# Patient Record
Sex: Male | Born: 1935 | Race: White | Hispanic: No | Marital: Married | Smoking: Former smoker
Health system: Southern US, Community
[De-identification: ages and names within clinical notes are randomized; demographics above are authoritative.]

## PROBLEM LIST (undated history)

## (undated) DIAGNOSIS — I5032 Chronic diastolic (congestive) heart failure: Secondary | ICD-10-CM

## (undated) DIAGNOSIS — R001 Bradycardia, unspecified: Secondary | ICD-10-CM

## (undated) DIAGNOSIS — Z952 Presence of prosthetic heart valve: Secondary | ICD-10-CM

## (undated) DIAGNOSIS — K219 Gastro-esophageal reflux disease without esophagitis: Secondary | ICD-10-CM

## (undated) DIAGNOSIS — F419 Anxiety disorder, unspecified: Secondary | ICD-10-CM

## (undated) DIAGNOSIS — I350 Nonrheumatic aortic (valve) stenosis: Secondary | ICD-10-CM

## (undated) DIAGNOSIS — G2581 Restless legs syndrome: Secondary | ICD-10-CM

## (undated) DIAGNOSIS — I10 Essential (primary) hypertension: Secondary | ICD-10-CM

## (undated) DIAGNOSIS — F329 Major depressive disorder, single episode, unspecified: Secondary | ICD-10-CM

## (undated) DIAGNOSIS — R0602 Shortness of breath: Secondary | ICD-10-CM

## (undated) DIAGNOSIS — A0472 Enterocolitis due to Clostridium difficile, not specified as recurrent: Secondary | ICD-10-CM

## (undated) DIAGNOSIS — I071 Rheumatic tricuspid insufficiency: Secondary | ICD-10-CM

## (undated) DIAGNOSIS — H9190 Unspecified hearing loss, unspecified ear: Secondary | ICD-10-CM

## (undated) DIAGNOSIS — I2510 Atherosclerotic heart disease of native coronary artery without angina pectoris: Secondary | ICD-10-CM

## (undated) DIAGNOSIS — F32A Depression, unspecified: Secondary | ICD-10-CM

## (undated) DIAGNOSIS — R0989 Other specified symptoms and signs involving the circulatory and respiratory systems: Secondary | ICD-10-CM

## (undated) DIAGNOSIS — K259 Gastric ulcer, unspecified as acute or chronic, without hemorrhage or perforation: Secondary | ICD-10-CM

## (undated) HISTORY — PX: CARDIAC CATHETERIZATION: SHX172

## (undated) HISTORY — DX: Bradycardia, unspecified: R00.1

## (undated) HISTORY — DX: Rheumatic tricuspid insufficiency: I07.1

## (undated) HISTORY — DX: Restless legs syndrome: G25.81

## (undated) HISTORY — DX: Other specified symptoms and signs involving the circulatory and respiratory systems: R09.89

## (undated) HISTORY — DX: Major depressive disorder, single episode, unspecified: F32.9

## (undated) HISTORY — PX: TOTAL HIP ARTHROPLASTY: SHX124

## (undated) HISTORY — PX: HERNIA REPAIR: SHX51

## (undated) HISTORY — DX: Presence of prosthetic heart valve: Z95.2

## (undated) HISTORY — PX: JOINT REPLACEMENT: SHX530

## (undated) HISTORY — DX: Depression, unspecified: F32.A

## (undated) HISTORY — DX: Chronic diastolic (congestive) heart failure: I50.32

## (undated) HISTORY — DX: Shortness of breath: R06.02

## (undated) HISTORY — DX: Nonrheumatic aortic (valve) stenosis: I35.0

## (undated) HISTORY — DX: Essential (primary) hypertension: I10

## (undated) HISTORY — DX: Atherosclerotic heart disease of native coronary artery without angina pectoris: I25.10

## (undated) HISTORY — DX: Gastro-esophageal reflux disease without esophagitis: K21.9

## (undated) HISTORY — DX: Unspecified hearing loss, unspecified ear: H91.90

## (undated) HISTORY — DX: Anxiety disorder, unspecified: F41.9

---

## 1999-09-04 ENCOUNTER — Encounter (INDEPENDENT_AMBULATORY_CARE_PROVIDER_SITE_OTHER): Payer: Self-pay | Admitting: Specialist

## 1999-09-04 ENCOUNTER — Other Ambulatory Visit: Admission: RE | Admit: 1999-09-04 | Discharge: 1999-09-04 | Payer: Self-pay | Admitting: Internal Medicine

## 2000-02-14 ENCOUNTER — Encounter: Payer: Self-pay | Admitting: Specialist

## 2000-02-14 ENCOUNTER — Encounter: Admission: RE | Admit: 2000-02-14 | Discharge: 2000-02-14 | Payer: Self-pay | Admitting: Specialist

## 2000-03-05 ENCOUNTER — Encounter: Admission: RE | Admit: 2000-03-05 | Discharge: 2000-06-03 | Payer: Self-pay | Admitting: Anesthesiology

## 2000-11-25 HISTORY — PX: VALVE REPLACEMENT: SUR13

## 2000-12-01 ENCOUNTER — Encounter (INDEPENDENT_AMBULATORY_CARE_PROVIDER_SITE_OTHER): Payer: Self-pay | Admitting: Specialist

## 2000-12-01 ENCOUNTER — Encounter: Payer: Self-pay | Admitting: Cardiothoracic Surgery

## 2000-12-02 ENCOUNTER — Encounter: Payer: Self-pay | Admitting: Cardiothoracic Surgery

## 2000-12-02 ENCOUNTER — Inpatient Hospital Stay (HOSPITAL_COMMUNITY): Admission: RE | Admit: 2000-12-02 | Discharge: 2000-12-08 | Payer: Self-pay | Admitting: *Deleted

## 2000-12-03 ENCOUNTER — Encounter: Payer: Self-pay | Admitting: Cardiothoracic Surgery

## 2000-12-04 ENCOUNTER — Encounter: Payer: Self-pay | Admitting: Cardiothoracic Surgery

## 2000-12-05 ENCOUNTER — Encounter: Payer: Self-pay | Admitting: Cardiothoracic Surgery

## 2000-12-06 ENCOUNTER — Encounter: Payer: Self-pay | Admitting: Cardiothoracic Surgery

## 2000-12-18 ENCOUNTER — Encounter: Payer: Self-pay | Admitting: Cardiothoracic Surgery

## 2000-12-18 ENCOUNTER — Encounter: Admission: RE | Admit: 2000-12-18 | Discharge: 2000-12-18 | Payer: Self-pay | Admitting: Cardiothoracic Surgery

## 2001-01-20 ENCOUNTER — Encounter (HOSPITAL_COMMUNITY): Admission: RE | Admit: 2001-01-20 | Discharge: 2001-04-20 | Payer: Self-pay | Admitting: Cardiology

## 2001-04-21 ENCOUNTER — Encounter (HOSPITAL_COMMUNITY): Admission: RE | Admit: 2001-04-21 | Discharge: 2001-06-03 | Payer: Self-pay | Admitting: Cardiology

## 2003-07-14 ENCOUNTER — Emergency Department (HOSPITAL_COMMUNITY): Admission: EM | Admit: 2003-07-14 | Discharge: 2003-07-14 | Payer: Self-pay | Admitting: Emergency Medicine

## 2003-07-14 ENCOUNTER — Encounter: Payer: Self-pay | Admitting: Emergency Medicine

## 2004-09-27 ENCOUNTER — Ambulatory Visit: Payer: Self-pay | Admitting: Internal Medicine

## 2004-10-11 ENCOUNTER — Ambulatory Visit: Payer: Self-pay | Admitting: Internal Medicine

## 2004-11-08 ENCOUNTER — Ambulatory Visit: Payer: Self-pay | Admitting: Cardiology

## 2004-11-22 ENCOUNTER — Ambulatory Visit: Payer: Self-pay | Admitting: *Deleted

## 2004-12-18 ENCOUNTER — Ambulatory Visit: Payer: Self-pay | Admitting: Cardiology

## 2005-01-15 ENCOUNTER — Ambulatory Visit: Payer: Self-pay | Admitting: *Deleted

## 2005-02-12 ENCOUNTER — Ambulatory Visit: Payer: Self-pay | Admitting: Internal Medicine

## 2005-02-14 ENCOUNTER — Ambulatory Visit: Payer: Self-pay

## 2005-03-12 ENCOUNTER — Ambulatory Visit: Payer: Self-pay | Admitting: Cardiology

## 2005-04-16 ENCOUNTER — Ambulatory Visit: Payer: Self-pay | Admitting: Internal Medicine

## 2005-05-14 ENCOUNTER — Ambulatory Visit: Payer: Self-pay | Admitting: Cardiology

## 2005-05-31 ENCOUNTER — Ambulatory Visit: Payer: Self-pay | Admitting: Cardiology

## 2005-06-27 ENCOUNTER — Ambulatory Visit: Payer: Self-pay | Admitting: Internal Medicine

## 2005-07-11 ENCOUNTER — Ambulatory Visit: Payer: Self-pay | Admitting: Internal Medicine

## 2005-08-08 ENCOUNTER — Ambulatory Visit: Payer: Self-pay | Admitting: Internal Medicine

## 2005-09-02 ENCOUNTER — Ambulatory Visit: Payer: Self-pay | Admitting: Cardiology

## 2005-09-23 ENCOUNTER — Ambulatory Visit: Payer: Self-pay | Admitting: Internal Medicine

## 2005-10-21 ENCOUNTER — Ambulatory Visit: Payer: Self-pay | Admitting: Cardiology

## 2005-11-12 ENCOUNTER — Ambulatory Visit: Payer: Self-pay | Admitting: Cardiovascular Disease

## 2005-12-10 ENCOUNTER — Ambulatory Visit: Payer: Self-pay | Admitting: *Deleted

## 2006-01-07 ENCOUNTER — Ambulatory Visit: Payer: Self-pay | Admitting: *Deleted

## 2006-02-03 ENCOUNTER — Ambulatory Visit: Payer: Self-pay | Admitting: Cardiology

## 2006-03-17 ENCOUNTER — Ambulatory Visit: Payer: Self-pay | Admitting: Internal Medicine

## 2006-04-16 ENCOUNTER — Ambulatory Visit: Payer: Self-pay | Admitting: Cardiology

## 2006-04-30 ENCOUNTER — Ambulatory Visit: Payer: Self-pay | Admitting: Cardiology

## 2006-05-27 ENCOUNTER — Ambulatory Visit: Payer: Self-pay | Admitting: Internal Medicine

## 2006-06-24 ENCOUNTER — Ambulatory Visit: Payer: Self-pay | Admitting: Cardiology

## 2006-07-11 ENCOUNTER — Ambulatory Visit: Payer: Self-pay | Admitting: Internal Medicine

## 2006-07-23 ENCOUNTER — Ambulatory Visit: Payer: Self-pay | Admitting: Cardiology

## 2006-08-07 ENCOUNTER — Ambulatory Visit: Payer: Self-pay | Admitting: Cardiology

## 2006-08-19 ENCOUNTER — Ambulatory Visit: Payer: Self-pay | Admitting: Cardiovascular Disease

## 2006-09-02 ENCOUNTER — Ambulatory Visit: Payer: Self-pay | Admitting: Internal Medicine

## 2006-09-30 ENCOUNTER — Ambulatory Visit: Payer: Self-pay | Admitting: *Deleted

## 2006-10-15 ENCOUNTER — Ambulatory Visit: Payer: Self-pay | Admitting: Cardiovascular Disease

## 2006-11-11 ENCOUNTER — Ambulatory Visit: Payer: Self-pay | Admitting: Cardiology

## 2006-12-09 ENCOUNTER — Ambulatory Visit: Payer: Self-pay | Admitting: Cardiology

## 2007-01-07 ENCOUNTER — Ambulatory Visit: Payer: Self-pay | Admitting: *Deleted

## 2007-01-27 ENCOUNTER — Ambulatory Visit: Payer: Self-pay | Admitting: Cardiology

## 2007-02-04 ENCOUNTER — Ambulatory Visit: Payer: Self-pay | Admitting: Cardiology

## 2007-04-01 ENCOUNTER — Ambulatory Visit: Payer: Self-pay | Admitting: *Deleted

## 2007-04-21 ENCOUNTER — Ambulatory Visit: Payer: Self-pay | Admitting: Cardiology

## 2007-05-19 ENCOUNTER — Ambulatory Visit: Payer: Self-pay | Admitting: Cardiology

## 2007-06-16 ENCOUNTER — Ambulatory Visit: Payer: Self-pay | Admitting: Internal Medicine

## 2007-07-14 ENCOUNTER — Ambulatory Visit: Payer: Self-pay | Admitting: Cardiology

## 2007-08-24 ENCOUNTER — Ambulatory Visit: Payer: Self-pay | Admitting: Cardiovascular Disease

## 2007-09-28 ENCOUNTER — Ambulatory Visit: Payer: Self-pay | Admitting: Cardiovascular Disease

## 2007-10-26 ENCOUNTER — Ambulatory Visit: Payer: Self-pay

## 2007-11-24 ENCOUNTER — Ambulatory Visit: Payer: Self-pay | Admitting: Cardiology

## 2007-12-23 ENCOUNTER — Ambulatory Visit: Payer: Self-pay | Admitting: Cardiology

## 2008-01-20 ENCOUNTER — Ambulatory Visit: Payer: Self-pay | Admitting: Cardiology

## 2008-02-11 ENCOUNTER — Ambulatory Visit: Payer: Self-pay | Admitting: Cardiology

## 2008-02-16 ENCOUNTER — Ambulatory Visit: Payer: Self-pay | Admitting: Cardiology

## 2008-02-16 ENCOUNTER — Ambulatory Visit: Payer: Self-pay

## 2008-02-16 ENCOUNTER — Encounter: Payer: Self-pay | Admitting: Cardiology

## 2008-02-24 ENCOUNTER — Encounter: Admission: RE | Admit: 2008-02-24 | Discharge: 2008-02-24 | Payer: Self-pay | Admitting: Specialist

## 2008-03-21 ENCOUNTER — Ambulatory Visit: Payer: Self-pay | Admitting: Cardiology

## 2008-03-28 ENCOUNTER — Ambulatory Visit: Payer: Self-pay

## 2008-04-21 ENCOUNTER — Ambulatory Visit: Payer: Self-pay | Admitting: Cardiology

## 2008-05-23 ENCOUNTER — Ambulatory Visit: Payer: Self-pay | Admitting: Cardiology

## 2008-06-21 ENCOUNTER — Ambulatory Visit: Payer: Self-pay | Admitting: Cardiology

## 2008-07-14 ENCOUNTER — Ambulatory Visit: Payer: Self-pay | Admitting: Internal Medicine

## 2008-07-25 ENCOUNTER — Ambulatory Visit: Payer: Self-pay | Admitting: Internal Medicine

## 2008-09-14 ENCOUNTER — Ambulatory Visit: Payer: Self-pay | Admitting: Cardiology

## 2008-10-12 ENCOUNTER — Ambulatory Visit: Payer: Self-pay | Admitting: Internal Medicine

## 2008-10-25 ENCOUNTER — Ambulatory Visit: Payer: Self-pay | Admitting: Cardiology

## 2008-11-15 ENCOUNTER — Ambulatory Visit: Payer: Self-pay | Admitting: Internal Medicine

## 2008-12-14 ENCOUNTER — Ambulatory Visit: Payer: Self-pay | Admitting: Cardiology

## 2009-01-18 ENCOUNTER — Ambulatory Visit: Payer: Self-pay | Admitting: Cardiology

## 2009-01-20 ENCOUNTER — Ambulatory Visit: Payer: Self-pay | Admitting: Cardiology

## 2009-02-09 ENCOUNTER — Ambulatory Visit: Payer: Self-pay | Admitting: Internal Medicine

## 2009-02-20 ENCOUNTER — Ambulatory Visit: Payer: Self-pay | Admitting: Cardiovascular Disease

## 2009-02-27 ENCOUNTER — Encounter: Payer: Self-pay | Admitting: Cardiology

## 2009-02-27 ENCOUNTER — Ambulatory Visit: Payer: Self-pay | Admitting: Cardiology

## 2009-04-12 ENCOUNTER — Ambulatory Visit: Payer: Self-pay | Admitting: Cardiology

## 2009-04-25 ENCOUNTER — Ambulatory Visit: Payer: Self-pay | Admitting: Cardiology

## 2009-05-23 ENCOUNTER — Ambulatory Visit: Payer: Self-pay | Admitting: Cardiology

## 2009-05-30 ENCOUNTER — Ambulatory Visit: Payer: Self-pay | Admitting: Cardiology

## 2009-06-13 ENCOUNTER — Ambulatory Visit: Payer: Self-pay | Admitting: Cardiology

## 2009-06-27 ENCOUNTER — Telehealth: Payer: Self-pay | Admitting: Cardiology

## 2009-07-04 ENCOUNTER — Ambulatory Visit: Payer: Self-pay | Admitting: Cardiology

## 2009-07-10 ENCOUNTER — Encounter: Payer: Self-pay | Admitting: *Deleted

## 2009-08-02 ENCOUNTER — Ambulatory Visit: Payer: Self-pay | Admitting: Cardiology

## 2009-08-03 LAB — CONVERTED CEMR LAB
INR: 2.4 — ABNORMAL HIGH (ref 0.0–1.5)
Prothrombin Time: 26.2 s — ABNORMAL HIGH (ref 11.6–15.2)

## 2009-08-31 ENCOUNTER — Ambulatory Visit: Payer: Self-pay | Admitting: Cardiovascular Disease

## 2009-08-31 LAB — CONVERTED CEMR LAB: POC INR: 3.4

## 2009-09-25 ENCOUNTER — Encounter: Payer: Self-pay | Admitting: Cardiology

## 2009-09-26 ENCOUNTER — Ambulatory Visit: Payer: Self-pay | Admitting: Cardiology

## 2009-09-26 ENCOUNTER — Ambulatory Visit: Payer: Self-pay | Admitting: Internal Medicine

## 2009-09-26 LAB — CONVERTED CEMR LAB: POC INR: 3

## 2009-10-25 ENCOUNTER — Ambulatory Visit: Payer: Self-pay | Admitting: Internal Medicine

## 2009-11-22 ENCOUNTER — Ambulatory Visit: Payer: Self-pay | Admitting: Cardiology

## 2009-12-20 ENCOUNTER — Ambulatory Visit: Payer: Self-pay | Admitting: Internal Medicine

## 2009-12-20 LAB — CONVERTED CEMR LAB: POC INR: 1.9

## 2010-01-17 ENCOUNTER — Ambulatory Visit: Payer: Self-pay | Admitting: Cardiovascular Disease

## 2010-01-17 LAB — CONVERTED CEMR LAB: POC INR: 2.4

## 2010-02-14 ENCOUNTER — Ambulatory Visit: Payer: Self-pay | Admitting: Cardiovascular Disease

## 2010-02-16 ENCOUNTER — Telehealth: Payer: Self-pay | Admitting: Internal Medicine

## 2010-03-14 ENCOUNTER — Ambulatory Visit: Payer: Self-pay | Admitting: Cardiovascular Disease

## 2010-04-11 ENCOUNTER — Ambulatory Visit: Payer: Self-pay | Admitting: Cardiovascular Disease

## 2010-04-11 LAB — CONVERTED CEMR LAB: POC INR: 2.1

## 2010-04-19 ENCOUNTER — Telehealth: Payer: Self-pay | Admitting: Cardiology

## 2010-04-20 ENCOUNTER — Telehealth: Payer: Self-pay | Admitting: Cardiology

## 2010-05-07 ENCOUNTER — Encounter: Admission: RE | Admit: 2010-05-07 | Discharge: 2010-05-07 | Payer: Self-pay | Admitting: Neurological Surgery

## 2010-05-09 ENCOUNTER — Encounter: Payer: Self-pay | Admitting: Cardiology

## 2010-05-09 ENCOUNTER — Ambulatory Visit: Payer: Self-pay | Admitting: Cardiovascular Disease

## 2010-05-10 ENCOUNTER — Ambulatory Visit: Payer: Self-pay | Admitting: Cardiology

## 2010-06-06 ENCOUNTER — Ambulatory Visit: Payer: Self-pay | Admitting: Cardiovascular Disease

## 2010-06-06 LAB — CONVERTED CEMR LAB: POC INR: 2.6

## 2010-07-04 ENCOUNTER — Ambulatory Visit: Payer: Self-pay | Admitting: Cardiovascular Disease

## 2010-07-09 ENCOUNTER — Telehealth: Payer: Self-pay | Admitting: Cardiology

## 2010-07-10 ENCOUNTER — Telehealth: Payer: Self-pay | Admitting: Cardiology

## 2010-07-31 ENCOUNTER — Ambulatory Visit: Payer: Self-pay | Admitting: Cardiology

## 2010-07-31 ENCOUNTER — Ambulatory Visit: Payer: Self-pay | Admitting: Cardiovascular Disease

## 2010-07-31 LAB — CONVERTED CEMR LAB: POC INR: 2.8

## 2010-08-25 DIAGNOSIS — R0602 Shortness of breath: Secondary | ICD-10-CM

## 2010-08-25 DIAGNOSIS — F419 Anxiety disorder, unspecified: Secondary | ICD-10-CM

## 2010-08-25 HISTORY — DX: Shortness of breath: R06.02

## 2010-08-25 HISTORY — DX: Anxiety disorder, unspecified: F41.9

## 2010-08-27 ENCOUNTER — Ambulatory Visit: Payer: Self-pay | Admitting: Cardiovascular Disease

## 2010-08-27 ENCOUNTER — Telehealth: Payer: Self-pay | Admitting: Cardiology

## 2010-08-28 ENCOUNTER — Encounter: Payer: Self-pay | Admitting: Cardiology

## 2010-08-29 ENCOUNTER — Ambulatory Visit: Payer: Self-pay | Admitting: Cardiology

## 2010-08-30 ENCOUNTER — Encounter: Payer: Self-pay | Admitting: Cardiology

## 2010-08-30 ENCOUNTER — Emergency Department: Payer: Self-pay | Admitting: Emergency Medicine

## 2010-08-30 ENCOUNTER — Ambulatory Visit: Payer: Self-pay | Admitting: Cardiology

## 2010-08-30 ENCOUNTER — Inpatient Hospital Stay (HOSPITAL_COMMUNITY): Admission: AD | Admit: 2010-08-30 | Discharge: 2010-09-01 | Payer: Self-pay | Admitting: Cardiology

## 2010-08-31 ENCOUNTER — Encounter: Payer: Self-pay | Admitting: Cardiology

## 2010-08-31 ENCOUNTER — Ambulatory Visit: Payer: Self-pay | Admitting: Cardiology

## 2010-09-06 ENCOUNTER — Telehealth: Payer: Self-pay | Admitting: Cardiology

## 2010-09-07 ENCOUNTER — Ambulatory Visit: Payer: Self-pay | Admitting: Cardiovascular Disease

## 2010-09-28 ENCOUNTER — Encounter: Payer: Self-pay | Admitting: Cardiology

## 2010-10-01 ENCOUNTER — Ambulatory Visit: Payer: Self-pay | Admitting: Cardiology

## 2010-10-01 ENCOUNTER — Ambulatory Visit: Payer: Self-pay | Admitting: Internal Medicine

## 2010-10-01 LAB — CONVERTED CEMR LAB: POC INR: 2.6

## 2010-10-31 ENCOUNTER — Ambulatory Visit: Payer: Self-pay | Admitting: Cardiovascular Disease

## 2010-11-28 ENCOUNTER — Ambulatory Visit: Admission: RE | Admit: 2010-11-28 | Discharge: 2010-11-28 | Payer: Self-pay | Source: Home / Self Care

## 2010-12-05 ENCOUNTER — Telehealth: Payer: Self-pay | Admitting: Cardiology

## 2010-12-11 ENCOUNTER — Ambulatory Visit: Admit: 2010-12-11 | Payer: Self-pay | Admitting: Cardiology

## 2010-12-27 NOTE — Medication Information (Signed)
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 no    Recent/future dental: no  Any missed doses?: no       Is patient compliant with meds? yes       Allergies: No Known Drug Allergies  Anticoagulation Management History:      The patient is taking warfarin and comes in today for a routine follow up visit.  Positive risk factors for bleeding include an age of 10 years or older.  The bleeding index is 'intermediate risk'.  Positive CHADS2 values include History of HTN and Age > 82 years old.  The start date was 12/01/2000.  His last INR was 2.4.  Anticoagulation responsible provider: Gollan.  INR POC: 2.7.  Cuvette Lot#: [id number].  Exp: 08/2011.    Anticoagulation Management Assessment/Plan:      The patient's current anticoagulation dose is Warfarin sodium 5 mg tabs: Use as directed by Anticoagulation Clinic.  The target INR is 2 - 3.  The next INR is due 08/01/2010.  Anticoagulation instructions were given to patient.  Results were reviewed/authorized by Cloyde Reams, RN, BSN.  He was notified by Cloyde Reams RN.         Prior Anticoagulation Instructions: INR 2.6  Continue on same dosage 1 tablet daily except 1.5 tablets on Tuesdays and Thursdays.  Recheck in 4 weeks.    Current Anticoagulation Instructions: INR 2.7  Continue on same dosage 1 tablet daily except 1/2 tablet on Tuesdays and Thursdays.  Recheck in 4 weeks.

## 2010-12-27 NOTE — Progress Notes (Signed)
Summary: c/o high blood pressure   Phone Note Call from Patient Call back at Home Phone [phone] Call back at [phone]   Caller: Patient Reason for Call: Talk to Nurse Summary of Call: per pt calling c/o elevated blood pressure, no energy, pt went to fire dept -186/80.  Initial call taken by: Lorne Skeens,  Apr 19, 2010 3:05 PM  Follow-up for Phone Call        pt  states he has noticed his BP has been running 180s/90s for past few days, has been feeling more tired than usual no chest pain or discomfort, pt states he is on vacation at the beach. Pt denies new meds or increase of salt in diet, he states he has been taking all his meds which include diovan 360/25 daily, met. 25mg  bid, and amlodipine 5mg  dailywill discuss w/Dr Myrtis Ser and call pt back tom Meredith Staggers, RN  Apr 19, 2010 3:42 PM   Additional Follow-up for Phone Call Additional follow up Details #1::        Increase amlodipine to 10 mg daily. Careful with salt intake. See me in office 04/27/2010 if he plans to be back in town.    Additional Follow-up for Phone Call Additional follow up Details #2::    pt aware will increase amlodipine to 10mg , is not returning from beach until 6/10, sch appt for 6/16 will cont to monitor and let us know if BP staying elevated Meredith Staggers, RN  Apr 20, 2010 10:50 AM

## 2010-12-27 NOTE — Assessment & Plan Note (Signed)
Summary: rov  Medications Added METOPROLOL TARTRATE 25 MG TABS (METOPROLOL TARTRATE) Take 1/2 tablet by mouth twice a day DIOVAN HCT 320-12.5 MG TABS (VALSARTAN-HYDROCHLOROTHIAZIDE) Take 1 tablet by mouth once a day      Allergies Added: NKDA  Visit Type:  and on visit Primary Provider:  Yates Decamp, MD  CC:  shortness of breath.  History of Present Illness: The patient was added to the schedule today.  He called saying he was having some shortness of breath at nighttime.  He is here with his daughter today.  After further questioning I now understand that he has had some problems with depression and anxiety recently.  He has been working with his primary physician.  His meds are being adjusted.  Sometimes he wakes up in the morning feeling nervous.  He has felt some of this during the night with some shortness of breath.  However, he is not having shortness of breath with exertion.  There is no chest pain.  There's been no syncope or presyncope.  His appetite is decreased.  He may have lost a small amount of weight recently.  Current Medications (verified): 1)  Warfarin Sodium 5 Mg Tabs (Warfarin Sodium) .... Use As Directed By Anticoagulation Clinic 2)  Metoprolol Tartrate 25 Mg Tabs (Metoprolol Tartrate) .... Take 1/2 Tablet By Mouth Twice A Day 3)  Aspirin 81 Mg Tbec (Aspirin) .... Take One Tablet By Mouth Daily 4)  Proscar 5 Mg Tabs (Finasteride) .Marland Kitchen.. 1 Tab By Mouth Once Daily 5)  Flomax 0.4 Mg Xr24h-Cap (Tamsulosin Hcl) .Marland Kitchen.. 1 Tab By Mouth Once Daily 6)  Multivitamins   Tabs (Multiple Vitamin) .... Daily 7)  Celebrex 100 Mg Caps (Celecoxib) .... As Needed 8)  Diovan Hct 320-12.5 Mg Tabs (Valsartan-Hydrochlorothiazide) .... Take 1 Tablet By Mouth Once A Day 9)  Amlodipine Besylate 10 Mg Tabs (Amlodipine Besylate) .... Take One Tablet By Mouth Daily 10)  Alprazolam 0.5 Mg Tabs (Alprazolam) .... 1/4 As Needed 11)  Allegra 180 Mg Tabs (Fexofenadine Hcl) .... (Seasonal For  Allergies) 12)  Fluoxetine Hcl 20 Mg Caps (Fluoxetine Hcl) .... Take 1 Tablet By Mouth Once A Day  Allergies (verified): No Known Drug Allergies  Past History:  Past Medical History: St. Jude mechanical prosthesis (aortic valve) in 2002. Marland Kitchen..echo.. march, 2009... valve working well. Coumadin.  Hypertension   BP higher than usual 04/19/2010...amlopdipine increased by telephone. GERD treated.  EF  55-65%... echo... march, 2009 No significant coronary disease....catheterization.... 2002  Question of soft carotid bruits in the past but no abnormalities  Decreased hearing in his right ear Bradycardia..... chronic and continued with no symptoms September, 2011 Depression  /  anxiety...... October, 2011  Review of Systems       The patient denies fever, chills, headache, sweats, rash, change in vision, change in hearing, nausea vomiting, urinary symptoms.  All other systems are reviewed and are negative.  Vital Signs:  Patient profile:   77 year old male Height:      71 inches Weight:      194 pounds BMI:     27.16 Pulse rate:   55 / minute BP sitting:   114 / 60  (left arm) Cuff size:   regular  Vitals Entered By: Hardin Negus, RMA (August 29, 2010 10:17 AM)  Physical Exam  General:  Patient is stable today.  He is here with his daughter. Head:  head is atraumatic. Eyes:  no xanthelasma. Neck:  no jugular venous extension. Chest Wall:  no chest wall tenderness. Lungs:  lungs are clear.  Respiratory effort is nonlabored. Heart:  cardiac exam reveals an S1 and S2.  The closure sound of his aortic prosthesis is crisp.  There is no AI. Abdomen:  abdomen is soft. Msk:  no musculoskeletal deformities. Extremities:  no peripheral edema. Skin:  no skin rashes. Psych:  patient is oriented to person time and place.  Affect is normal.   Impression & Recommendations:  Problem # 1:  BRADYCARDIA (ICD-427.89)  The following medications were removed from the medication list:     Metoprolol Tartrate 25 Mg Tabs (Metoprolol tartrate) .Marland Kitchen... Take 1/2 tablet by mouth twice a day His updated medication list for this problem includes:    Warfarin Sodium 5 Mg Tabs (Warfarin sodium) ..... Use as directed by anticoagulation clinic    Aspirin 81 Mg Tbec (Aspirin) .Marland Kitchen... Take one tablet by mouth daily    Amlodipine Besylate 10 Mg Tabs (Amlodipine besylate) .Marland Kitchen... Take one tablet by mouth daily The patient continues to have mild sinus bradycardia.  I feel that this is probably not playing a role in his current symptoms.  However, there is no proven coronary disease.  Blood pressure is stable.  He is on a very low dose of beta blocker.  We will stop his beta blocker.  Problem # 2:  * COUMADIN THERAPY  stable on Coumadin.  This is to be continued.  Problem # 3:  ESSENTIAL HYPERTENSION, BENIGN (ICD-401.1)  The following medications were removed from the medication list:    Metoprolol Tartrate 25 Mg Tabs (Metoprolol tartrate) .Marland Kitchen... Take 1/2 tablet by mouth twice a day His updated medication list for this problem includes:    Aspirin 81 Mg Tbec (Aspirin) .Marland Kitchen... Take one tablet by mouth daily    Diovan Hct 320-12.5 Mg Tabs (Valsartan-hydrochlorothiazide) .Marland Kitchen... Take 1 tablet by mouth once a day    Amlodipine Besylate 10 Mg Tabs (Amlodipine besylate) .Marland Kitchen... Take one tablet by mouth daily I. pressures currently quite stable.  No change in therapy.  Problem # 4:  AORTIC VALVE REPLACEMENT, HX OF (ICD-V43.3)  The patient's aortic valve prosthesis appears to be working well on physical exam.  However with intermittent shortness of breath at rest we need to be sure that there is no unusual finding on his aortic prosthesis.  He will followup 2-D echo.  I will be in touch with him with the results.  Orders: Echocardiogram (Echo)  Problem # 5:  * ANXIETY DEPRESSION This is a new problem for him as it relates to my evaluation.  His primary physician is working with this carefully.  I suspect that  his decreased appetite as part of this.  I suspect that his sensation of shortness of breath at night is also part of this.  However we need to be careful to be sure he is not having malfunction of his aortic valve prosthesis.  Patient Instructions: 1)  Stop Metoprolol 2)  Your physician has requested that you have an echocardiogram.  Echocardiography is a painless test that uses sound waves to create images of your heart. It provides your doctor with information about the size and shape of your heart and how well your heart's chambers and valves are working.  This procedure takes approximately one hour. There are no restrictions for this procedure.  In [place] office. 3)  Follow up in 3 months.

## 2010-12-27 NOTE — Medication Information (Signed)
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 no    Recent/future dental: no  Any missed doses?: no       Is patient compliant with meds? yes       Allergies: No Known Drug Allergies  Anticoagulation Management History:      The patient is taking warfarin and comes in today for a routine follow up visit.  Positive risk factors for bleeding include an age of 75 years or older.  The bleeding index is 'intermediate risk'.  Positive CHADS2 values include History of HTN and Age > 36 years old.  The start date was 12/01/2000.  His last INR was 2.4.  Anticoagulation responsible provider: Lyle Leisner.  INR POC: 2.6.  Cuvette Lot#: [id number].  Exp: 07/2011.    Anticoagulation Management Assessment/Plan:      The patient's current anticoagulation dose is Warfarin sodium 5 mg tabs: Use as directed by Anticoagulation Clinic.  The target INR is 2 - 3.  The next INR is due 07/04/2010.  Anticoagulation instructions were given to patient.  Results were reviewed/authorized by Cloyde Reams, RN, BSN.  He was notified by Cloyde Reams RN.         Prior Anticoagulation Instructions: INR 2.5  Continue taking 1 tab daily except 1.5 tab on Tuesday and Thursday. Recheck in 4 weeks  Current Anticoagulation Instructions: INR 2.6  Continue on same dosage 1 tablet daily except 1.5 tablets on Tuesdays and Thursdays.  Recheck in 4 weeks.

## 2010-12-27 NOTE — Medication Information (Signed)
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 no    Recent/future dental: no  Any missed doses?: no       Is patient compliant with meds? yes       Allergies: No Known Drug Allergies  Anticoagulation Management History:      The patient is taking warfarin and comes in today for a routine follow up visit.  Positive risk factors for bleeding include an age of 75 years or older.  The bleeding index is 'intermediate risk'.  Positive CHADS2 values include History of HTN and Age > 31 years old.  The start date was 12/01/2000.  His last INR was 2.4.  Anticoagulation responsible provider: Zackarey Holleman.  INR POC: 2.1.  Exp: 08/2010.    Anticoagulation Management Assessment/Plan:      The patient's current anticoagulation dose is Warfarin sodium 5 mg tabs: Use as directed by Anticoagulation Clinic.  The target INR is 2 - 3.  The next INR is due 03/14/2010.  Anticoagulation instructions were given to patient.  Results were reviewed/authorized by Charlena Cross, RN, BSN.  He was notified by Charlena Cross, RN, BSN.         Prior Anticoagulation Instructions: The patient is to continue with the same dose of coumadin.  This dosage includes: coumadin 5 mg daily with 7.5 mg on Tues and Thurs  Current Anticoagulation Instructions: Same as Prior Instructions.

## 2010-12-27 NOTE — Medication Information (Signed)
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 no    Recent/future dental: no  Any missed doses?: no       Is patient compliant with meds? yes       Current Medications (verified): 1)  Warfarin Sodium 5 Mg Tabs (Warfarin Sodium) .... Use As Directed By Anticoagulation Clinic 2)  Metoprolol Tartrate 25 Mg Tabs (Metoprolol Tartrate) .... Take One Tablet By Mouth Twice A Day 3)  Aspirin 81 Mg Tbec (Aspirin) .... Take One Tablet By Mouth Daily 4)  Proscar 5 Mg Tabs (Finasteride) .Marland Kitchen.. 1 Tab By Mouth Once Daily 5)  Flomax 0.4 Mg Xr24h-Cap (Tamsulosin Hcl) .Marland Kitchen.. 1 Tab By Mouth Once Daily 6)  Multivitamins   Tabs (Multiple Vitamin) .... Daily 7)  Celebrex 100 Mg Caps (Celecoxib) .... As Needed 8)  Diovan Hct 320-25 Mg Tabs (Valsartan-Hydrochlorothiazide) .Marland Kitchen.. 1 Once Daily 9)  Amlodipine Besylate 5 Mg Tabs (Amlodipine Besylate) .... Take One Tablet By Mouth Daily 10)  Alprazolam 0.25 Mg Tabs (Alprazolam) .... 1/4 As Needed  Allergies (verified): No Known Drug Allergies  Anticoagulation Management History:      The patient is taking warfarin and comes in today for a routine follow up visit.  Positive risk factors for bleeding include an age of 75 years or older.  The bleeding index is 'intermediate risk'.  Positive CHADS2 values include History of HTN and Age > 46 years old.  The start date was 12/01/2000.  His last INR was 2.4.   Anticoagulation responsible provider: Escher Harr MD, Reuel Boom.  INR POC: 1.9.  Exp: 08/2010.    Anticoagulation Management Assessment/Plan:      The patient's current anticoagulation dose is Warfarin sodium 5 mg tabs: Use as directed by Anticoagulation Clinic.  The target INR is 2 - 3.  The next INR is due 01/17/2010.  Anticoagulation instructions were given to patient.  Results were reviewed/authorized by Charlena Cross, RN, BSN.  He was notified by Charlena Cross, RN, BSN.         Prior Anticoagulation Instructions: The patient is to continue with the same dose of coumadin.  This dosage includes: coumadin 5 mg daily with 7.5 mg on Tues and Thurs  Current Anticoagulation Instructions: coumadin 7.5 mg today then resume dose of coumadin 5 mg daily with 7.5 mg on Tues and Thurs

## 2010-12-27 NOTE — Medication Information (Signed)
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 no     Any missed doses?: no       Is patient compliant with meds? yes       Allergies: No Known Drug Allergies  Anticoagulation Management History:      The patient is taking warfarin and comes in today for a routine follow up visit.  Positive risk factors for bleeding include an age of 75 years or older.  The bleeding index is 'intermediate risk'.  Positive CHADS2 values include History of HTN and Age > 50 years old.  The start date was 12/01/2000.  His last INR was 2.4.  Anticoagulation responsible provider: Alishah Schulte.  INR POC: 2.1.  Cuvette Lot#: [id number].  Exp: 06/2011.    Anticoagulation Management Assessment/Plan:      The patient's current anticoagulation dose is Warfarin sodium 5 mg tabs: Use as directed by Anticoagulation Clinic.  The target INR is 2 - 3.  The next INR is due 05/09/2010.  Anticoagulation instructions were given to patient.  Results were reviewed/authorized by Cloyde Reams, RN, BSN.  He was notified by Cloyde Reams RN.         Prior Anticoagulation Instructions: INR 2.2  Continue on same dosage 5mg  daily except 7.5mg  on Tuesdays and Thursdays.  Recheck in 4 weeks.    Current Anticoagulation Instructions: INR 2.1  Continue on same dosage 5mg  daily except 7.5mg  on Tuesdays and Thursdays.  Recheck in 4 weeks.

## 2010-12-27 NOTE — Medication Information (Signed)
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 no    Recent/future dental: no  Any missed doses?: no       Is patient compliant with meds? yes       Allergies: No Known Drug Allergies  Anticoagulation Management History:      The patient is taking warfarin and comes in today for a routine follow up visit.  Positive risk factors for bleeding include an age of 75 years or older.  The bleeding index is 'intermediate risk'.  Positive CHADS2 values include History of HTN and Age > 75 years old.  The start date was 12/01/2000.  His last INR was 2.4.  Anticoagulation responsible provider: Hadiya Spoerl.  INR POC: 2.4.  Exp: 08/2010.    Anticoagulation Management Assessment/Plan:      The patient's current anticoagulation dose is Warfarin sodium 5 mg tabs: Use as directed by Anticoagulation Clinic.  The target INR is 2 - 3.  The next INR is due 02/14/2010.  Anticoagulation instructions were given to patient.  Results were reviewed/authorized by Charlena Cross, RN, BSN.  He was notified by Charlena Cross, RN, BSN.         Prior Anticoagulation Instructions: coumadin 7.5 mg today then resume dose of coumadin 5 mg daily with 7.5 mg on Tues and Thurs  Current Anticoagulation Instructions: The patient is to continue with the same dose of coumadin.  This dosage includes: coumadin 5 mg daily with 7.5 mg on Tues and Thurs

## 2010-12-27 NOTE — Miscellaneous (Signed)
  Clinical Lists Changes  Observations: Added new observation of PAST MED HX: St. Jude mechanical prosthesis (aortic valve) in 2002. Marland Kitchen..echo.. march, 2009... valve working well. Coumadin.  Hypertension   BP higher than usual 04/19/2010...amlopdipine increased by telephone. GERD treated.  EF  55-65%... echo... march, 2009 No significant coronary disease....catheterization.... 2002  Question of soft carotid bruits in the past but no abnormalities  Decreased hearing in his right ear (05/09/2010 8:16) Added new observation of PRIMARY MD: Yates Decamp, MD (05/09/2010 8:16)       Past History:  Past Medical History: St. Jude mechanical prosthesis (aortic valve) in 2002. Marland Kitchen..echo.. march, 2009... valve working well. Coumadin.  Hypertension   BP higher than usual 04/19/2010...amlopdipine increased by telephone. GERD treated.  EF  55-65%... echo... march, 2009 No significant coronary disease....catheterization.... 2002  Question of soft carotid bruits in the past but no abnormalities  Decreased hearing in his right ear

## 2010-12-27 NOTE — Letter (Signed)
Summary: [facility] - ER Report  [facility] - ER Report   Imported By: Marylou Mccoy 09/14/2010 09:19:33  _____________________________________________________________________  External Attachment:    Type:   Image     Comment:   External Document

## 2010-12-27 NOTE — Letter (Signed)
Summary: [facility] - CT Chest for Pulm Embolism w/ Contrast  [facility] - CT Chest for Pulm Embolism w/ Contrast   Imported By: Marylou Mccoy 09/14/2010 09:34:25  _____________________________________________________________________  External Attachment:    Type:   Image     Comment:   External Document

## 2010-12-27 NOTE — Medication Information (Signed)
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 no    Recent/future dental: no  Any missed doses?: no       Is patient compliant with meds? yes       Allergies: No Known Drug Allergies  Anticoagulation Management History:      The patient is taking warfarin and comes in today for a routine follow up visit.  Positive risk factors for bleeding include an age of 75 years or older.  The bleeding index is 'intermediate risk'.  Positive CHADS2 values include History of HTN and Age > 39 years old.  The start date was 12/01/2000.  His last INR was 2.4.  Anticoagulation responsible provider: Gollan.  INR POC: 2.0.  Cuvette Lot#: [id number].  Exp: 10/2011.    Anticoagulation Management Assessment/Plan:      The patient's current anticoagulation dose is Warfarin sodium 5 mg tabs: Use as directed by Anticoagulation Clinic.  The target INR is 2 - 3.  The next INR is due 11/28/2010.  Anticoagulation instructions were given to patient.  Results were reviewed/authorized by Bethena Midget, RN, BSN.  He was notified by Bethena Midget, RN, BSN.         Prior Anticoagulation Instructions: INR 2.6  Continue same dose of 1 tablet every day except 1 1/2 tablets on Tuesday and Thursday.  Recheck INR in 4 weeks.   Current Anticoagulation Instructions: INR 2.0 Today take 7.5mg s then resume 5mg s everyday except 7.5mg  on Tuesdays and Thursdays. Recheck in 4 weeks.

## 2010-12-27 NOTE — Medication Information (Signed)
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 yes       Details: taking scheduled celebrex now instead of prn  Recent/future dental: yes  Any missed doses?: no       Is patient compliant with meds? yes       Allergies: No Known Drug Allergies  Anticoagulation Management History:      Positive risk factors for bleeding include an age of 90 years or older.  The bleeding index is 'intermediate risk'.  Positive CHADS2 values include History of HTN and Age > 26 years old.  The start date was 12/01/2000.  His last INR was 2.4.  Anticoagulation responsible provider: Eden Emms MD, Theron Arista.  INR POC: 2.8.  Cuvette Lot#: [id number].  Exp: 08/2011.    Anticoagulation Management Assessment/Plan:      The patient's current anticoagulation dose is Warfarin sodium 5 mg tabs: Use as directed by Anticoagulation Clinic.  The target INR is 2 - 3.  The next INR is due 08/28/2010.  Anticoagulation instructions were given to patient.  Results were reviewed/authorized by Cloyde Reams, RN, BSN.  He was notified by Kennieth Francois.         Prior Anticoagulation Instructions: INR 2.7  Continue on same dosage 1 tablet daily except 1/2 tablet on Tuesdays and Thursdays.  Recheck in 4 weeks.    Current Anticoagulation Instructions: INR 2.8  Continue taking one tablet every day except one and one-half tablets on Tuesday and Thursday.  Call [facility] in [place] to schedule an appointment to check your INR in four weeks.

## 2010-12-27 NOTE — Progress Notes (Signed)
Summary: pt needs refill   Phone Note Refill Request Call back at Home Phone [phone] Message from:  Patient on Rx solution  Refills Requested: Medication #1:  AMLODIPINE BESYLATE 5 MG TABS Take one tablet by mouth daily Initial call taken by: Omer Jack,  February 16, 2010 10:26 AM    Prescriptions: AMLODIPINE BESYLATE 5 MG TABS (AMLODIPINE BESYLATE) Take one tablet by mouth daily  #30 x 8   Entered by:   Hardin Negus, RMA   Authorized by:   Dolores Patty, MD, [facility]   Signed by:   Hardin Negus, RMA on 02/16/2010   Method used:   Electronically to        [facility]* (mail-order)       [address]       [place], [place]  [address]       Ph: [phone]       Fax: [phone]   RxID:   [id number]

## 2010-12-27 NOTE — Medication Information (Signed)
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 yes       Details: allegra   Any missed doses?: no       Is patient compliant with meds? yes       Allergies: No Known Drug Allergies  Anticoagulation Management History:      Positive risk factors for bleeding include an age of 75 years or older.  The bleeding index is 'intermediate risk'.  Positive CHADS2 values include History of HTN and Age > 75 years old.  The start date was 12/01/2000.  His last INR was 2.4.  Anticoagulation responsible provider: G[place].  INR POC: 2.5.  Cuvette Lot#: [id number].  Exp: 07/2011.    Anticoagulation Management Assessment/Plan:      The patient's current anticoagulation dose is Warfarin sodium 5 mg tabs: Use as directed by Anticoagulation Clinic.  The target INR is 2 - 3.  The next INR is due 06/06/2010.  Anticoagulation instructions were given to patient.  Results were reviewed/authorized by Cloyde Reams, RN, BSN.  He was notified by Cloyde Reams RN.         Prior Anticoagulation Instructions: INR 2.1  Continue on same dosage 5mg  daily except 7.5mg  on Tuesdays and Thursdays.  Recheck in 4 weeks.    Current Anticoagulation Instructions: INR 2.5  Continue taking 1 tab daily except 1.5 tab on Tuesday and Thursday. Recheck in 4 weeks

## 2010-12-27 NOTE — Progress Notes (Signed)
Summary: refill  Medications Added AMLODIPINE BESYLATE 10 MG TABS (AMLODIPINE BESYLATE) Take one tablet by mouth daily       Phone Note Refill Request   Refills Requested: Medication #1:  AMLODIPINE BESYLATE 10 MG TABS Take one tablet by mouth daily   Supply Requested: 3 months  Method Requested: Fax to [facility] Initial call taken by: Migdalia Dk,  Apr 20, 2010 11:09 AM Caller: Patient  Follow-up for Phone Call        left mess on pts voice mail that rx was sent in [facility], RN  Apr 20, 2010 5:05 PM     New/Updated Medications: AMLODIPINE BESYLATE 10 MG TABS (AMLODIPINE BESYLATE) Take one tablet by mouth daily Prescriptions: AMLODIPINE BESYLATE 10 MG TABS (AMLODIPINE BESYLATE) Take one tablet by mouth daily  #90 x 3   Entered by:   Meredith Staggers, RN   Authorized by:   Talitha Givens, MD, [facility]   Signed by:   Meredith Staggers, RN on 04/20/2010   Method used:   Electronically to        [facility] (mail-order)       [address], [place]  [address]       Ph: [phone]       Fax: [phone]   RxID:   [id number]

## 2010-12-27 NOTE — Medication Information (Signed)
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 yes       Details: START fluoxetine   Any missed doses?: no       Is patient compliant with meds? yes       Allergies: No Known Drug Allergies  Anticoagulation Management History:      The patient is taking warfarin and comes in today for a routine follow up visit.  Positive risk factors for bleeding include an age of 75 years or older.  The bleeding index is 'intermediate risk'.  Positive CHADS2 values include History of HTN and Age > 28 years old.  The start date was 12/01/2000.  His last INR was 2.4.  Anticoagulation responsible Shaylie Eklund: Eden Emms MD, Theron Arista.  Cuvette Lot#: [id number].  Exp: 09/2011.    Anticoagulation Management Assessment/Plan:      The patient's current anticoagulation dose is Warfarin sodium 5 mg tabs: Use as directed by Anticoagulation Clinic.  The target INR is 2 - 3.  The next INR is due 09/24/2010.  Anticoagulation instructions were given to patient.  Results were reviewed/authorized by Cloyde Reams, RN, BSN.  He was notified by Benedict Needy, RN.         Prior Anticoagulation Instructions: INR 2.8  Continue taking one tablet every day except one and one-half tablets on Tuesday and Thursday.  Call [facility] in [place] to schedule an appointment to check your INR in four weeks.    Current Anticoagulation Instructions: INR 2.3  Continue taking 1 tab daily except for 1/2 tabs on Tuesday and Thursday. Recheck in 4 weeks.

## 2010-12-27 NOTE — Progress Notes (Signed)
Summary: c/o sob, hr 89   Phone Note Call from Patient Call back at Home Phone [phone] Call back at [phone]   Caller: Patient Reason for Call: Talk to Nurse Complaint: Breathing Problems Summary of Call: per pt callijng,c/o sob,  hr 89 on yesterday. pt in hospital last week for heart cath.  Initial call taken by: Lorne Skeens,  September 06, 2010 8:05 AM  Follow-up for Phone Call        spoke w/pt he states he is still having sob, he describes it as having to take deep breathes every so often or he doesn't feel like hes getting oxygen, explained his test including echo and cath were good, he states that also yest. his heart felt like it was "pounding" and hr was 89 so he took 1/2 of metoprolol, today he has felt like heart pounding again but hr was only 72, BP today was 155/73, will let Dr Myrtis Ser know  Follow-up by: Meredith Staggers, RN,  September 06, 2010 10:08 AM  Additional Follow-up for Phone Call Additional follow up Details #1::        discussed w/Dr Myrtis Ser pt can resume met. tart 25mg  1/2 tab two times a day, pt is aware and will resume, reassured cardiac test were good Meredith Staggers, RN  September 06, 2010 1:04 PM

## 2010-12-27 NOTE — Progress Notes (Signed)
Summary: MEDICATION   Phone Note Call from Patient Call back at Home Phone [phone]   Caller: SELF Call For: KATZ Summary of Call: PRIMARY DR PUT PT ON INDOMETHACIN-WANTS TO MAKE SURE THIS DOES NOT INTERFERE WITH HIS COUMADIN Initial call taken by: Harlon Flor,  July 10, 2010 1:13 PM  Follow-up for Phone Call        Spoke to [facility] pharmacist [facility] this medication does not increase or decrease bleeding times but it doest increase the risk of GI bleed. Spoke to pt about taking the medication with food. He reported that he has been taking the medication after meals.  Follow-up by: Benedict Needy, RN,  July 11, 2010 9:21 AM     Appended Document: MEDICATION Thanks

## 2010-12-27 NOTE — Progress Notes (Signed)
Summary: c/o sob   Phone Note Call from Patient Call back at Home Phone [phone] Call back at [phone]   Caller: Patient Reason for Call: Talk to Nurse Complaint: Breathing Problems Summary of Call: pt would like to be seen today, c/o hard time breathing. no chestpain.  Initial call taken by: Lorne Skeens,  August 27, 2010 9:03 AM  Follow-up for Phone Call        spoke w/pt he states for the past 3-4 days he has been awoke at around 4am w/difficulty breathing, once he gets up and moves around and takes meds he feels some better, no CP, no edema, he states his BP this am around 6 was 188/78, this was before he took his meds, he has not rechecked it, he is feeling ok now, SOB is some better but still doesn't feel totally normal, discussed w/Dr Myrtis Ser and sch appt for Wed 10/5 at 10am  Follow-up by: Meredith Staggers, RN,  August 27, 2010 9:33 AM

## 2010-12-27 NOTE — Miscellaneous (Signed)
  Clinical Lists Changes  Observations: Added new observation of PAST MED HX: St. Jude mechanical prosthesis (aortic valve) in 2002. Marland Kitchen..echo.. march, 2009... valve working well. Coumadin.  Hypertension   BP higher than usual 04/19/2010...amlopdipine increased by telephone. GERD treated.  EF  55-65%... echo... march, 2009 No significant coronary disease....catheterization.... 2002  Question of soft carotid bruits in the past but no abnormalities  Decreased hearing in his right ear Bradycardia..... chronic and continued with no symptoms September, 2011 (08/28/2010 8:28) Added new observation of PRIMARY MD: Yates Decamp, MD (08/28/2010 8:28)       Past History:  Past Medical History: St. Jude mechanical prosthesis (aortic valve) in 2002. Marland Kitchen..echo.. march, 2009... valve working well. Coumadin.  Hypertension   BP higher than usual 04/19/2010...amlopdipine increased by telephone. GERD treated.  EF  55-65%... echo... march, 2009 No significant coronary disease....catheterization.... 2002  Question of soft carotid bruits in the past but no abnormalities  Decreased hearing in his right ear Bradycardia..... chronic and continued with no symptoms September, 2011 SH/Risk Factors reviewed for relevance

## 2010-12-27 NOTE — Assessment & Plan Note (Signed)
Summary: eph      Allergies Added: NKDA  Current Medications (verified): 1)  Warfarin Sodium 5 Mg Tabs (Warfarin Sodium) .... Use As Directed By Anticoagulation Clinic 2)  Aspirin 81 Mg Tbec (Aspirin) .... Take One Tablet By Mouth Daily 3)  Proscar 5 Mg Tabs (Finasteride) .Marland Kitchen.. 1 Tab By Mouth Once Daily 4)  Flomax 0.4 Mg Xr24h-Cap (Tamsulosin Hcl) .Marland Kitchen.. 1 Tab By Mouth Once Daily 5)  Multivitamins   Tabs (Multiple Vitamin) .... Daily 6)  Celebrex 100 Mg Caps (Celecoxib) .... As Needed 7)  Diovan Hct 320-12.5 Mg Tabs (Valsartan-Hydrochlorothiazide) .... Take 1 Tablet By Mouth Once A Day 8)  Amlodipine Besylate 10 Mg Tabs (Amlodipine Besylate) .... Take One Tablet By Mouth Daily 9)  Alprazolam 0.5 Mg Tabs (Alprazolam) .... 1/4 As Needed 10)  Allegra 180 Mg Tabs (Fexofenadine Hcl) .... (Seasonal For Allergies) 11)  Fluoxetine Hcl 20 Mg Caps (Fluoxetine Hcl) .... Take 1 Tablet By Mouth Once A Day  Allergies (verified): No Known Drug Allergies  Past History:  Past Medical History: St. Jude mechanical prosthesis (aortic valve) in 2002. Marland Kitchen..echo.. march, 2009... valve working well.  /  echo..08/2010 working well Coumadin.  Hypertension   BP higher than usual 04/19/2010...amlopdipine increased by telephone. GERD treated.  EF  55-65%... echo... march, 2009 /  EF 65%...echo...08/31/2010 No significant coronary disease....catheterization.... 2002  /  catheterization August 30, 2010... mild CAD.Marland Kitchen good valve function Question of soft carotid bruits in the past but no abnormalities  Decreased hearing in his right ear Bradycardia..... chronic and continued with no symptoms September, 2011 Depression  /  anxiety...... October, 2011II Shortness of breath   October, 2011.episodes at 5 AM... eventually felt to be anxiety... after complete workup including catheterization  /   patient greatly improved with anxiety medications November, 2011  Review of Systems       Patient denies fever, chills,  headache, sweats, rash, change in vision, change in hearing, chest pain, cough, nausea vomiting, urinary symptoms.  All of the systems are reviewed and are negative.  Vital Signs:  Patient profile:   77 year old male Height:      71 inches Weight:      192 pounds BMI:     26.88 Pulse rate:   70 / minute BP sitting:   124 / 58  (left arm) Cuff size:   regular  Vitals Entered By: Hardin Negus, RMA (October 01, 2010 3:03 PM)  Physical Exam  General:  patient looks much better today. Eyes:  no xanthelasma. Neck:  no jugular venous extension. Lungs:  lungs are clear.  Respiratory effort is nonlabored. Heart:  cardiac exam reveals S1-S2 with crisp closure sound of his aortic prosthesis Abdomen:  abdomen is soft. Extremities:  no peripheral edema. Psych:  patient is oriented to person time and place.  Affect is normal.   Impression & Recommendations:  Problem # 1:  * ACUTE SHORTNESS OF BREATH EPISODES This has been from anxiety.  The issue is greatly improved.  No further workup needed  Problem # 2:  AORTIC VALVE REPLACEMENT, HX OF (ICD-V43.3) Aortic valve working well.  No further workup.  Problem # 3:  ESSENTIAL HYPERTENSION, BENIGN (ICD-401.1)  His updated medication list for this problem includes:    Aspirin 81 Mg Tbec (Aspirin) .Marland Kitchen... Take one tablet by mouth daily    Diovan Hct 320-12.5 Mg Tabs (Valsartan-hydrochlorothiazide) .Marland Kitchen... Take 1 tablet by mouth once a day    Amlodipine Besylate 10 Mg Tabs (Amlodipine  besylate) .Marland Kitchen... Take one tablet by mouth daily Blood pressure well-controlled.  No change in therapy.  Patient Instructions: 1)  Your physician wants you to follow-up in:  6 months You will receive a reminder letter in the mail two months in advance. If you don't receive a letter, please call our office to schedule the follow-up appointment.

## 2010-12-27 NOTE — Medication Information (Signed)
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 no    Recent/future dental: no  Any missed doses?: no       Is patient compliant with meds? yes       Allergies: No Known Drug Allergies  Anticoagulation Management History:      The patient is taking warfarin and comes in today for a routine follow up visit.  Positive risk factors for bleeding include an age of 75 years or older.  The bleeding index is 'intermediate risk'.  Positive CHADS2 values include History of HTN and Age > 75 years old.  The start date was 12/01/2000.  His last INR was 2.4.  Anticoagulation responsible provider: Excell Seltzer MD, Casimiro Needle.  INR POC: 2.2.  Exp: 08/2010.    Anticoagulation Management Assessment/Plan:      The patient's current anticoagulation dose is Warfarin sodium 5 mg tabs: Use as directed by Anticoagulation Clinic.  The target INR is 2 - 3.  The next INR is due 04/11/2010.  Anticoagulation instructions were given to patient.  Results were reviewed/authorized by Cloyde Reams, RN, BSN.  He was notified by Cloyde Reams RN.         Prior Anticoagulation Instructions: The patient is to continue with the same dose of coumadin.  This dosage includes: coumadin 5 mg daily with 7.5 mg on Tues and Thurs  Current Anticoagulation Instructions: INR 2.2  Continue on same dosage 5mg  daily except 7.5mg  on Tuesdays and Thursdays.  Recheck in 4 weeks.   Prescriptions: WARFARIN SODIUM 5 MG TABS (WARFARIN SODIUM) Use as directed by Anticoagulation Clinic   #135 x 3   Entered by:   Cloyde Reams RN   Authorized by:   Talitha Givens, MD, [facility]   Signed by:   Cloyde Reams RN on 03/14/2010   Method used:   Electronically to        [facility] (mail-order)       [address]       [place], [place]  [address]       Ph: [phone]       Fax: [phone]   RxID:   [id number]

## 2010-12-27 NOTE — Progress Notes (Signed)
Summary: b/p reading   Phone Note Call from Patient Call back at Home Phone [phone]   Caller: Patient Reason for Call: Talk to Nurse Summary of Call: per pt calling with b/p reading   125/65 on 7/13 125/63 on 7/14 120/54 on 7/15 124/62 on 7/16 125/63 on 7/17 127/63 on 7/18 132/60 on 7/19 119/61 on 7/20 129/62 on 7/21 Initial call taken by: Lorne Skeens,  July 09, 2010 1:15 PM  Follow-up for Phone Call        spoke w/pt he has been doing ok, BP readings good, will send to Dr Myrtis Ser for review Meredith Staggers, RN  July 09, 2010 2:36 PM   Additional Follow-up for Phone Call Additional follow up Details #1::        BP is good. Keep on same meds. Myrtis Ser  pt is aware Meredith Staggers, RN  July 10, 2010 12:50 PM

## 2010-12-27 NOTE — Assessment & Plan Note (Signed)
Summary: rov  Medications Added ALLEGRA 180 MG TABS (FEXOFENADINE HCL) (seasonal for allergies)      Allergies Added: NKDA  Visit Type:  Follow-up Primary Provider:  Yates Decamp, MD  CC:  hypertension.  History of Present Illness: The patient is seen for hypertension..  He has known aortic valve disease and has history of an aortic valve replacement.  He's done very well.  Recently he felt poorly and his systolic blood pressure was higher than normal in the range of 180.  We told him to increase his amlodipine to 10 mg daily.  His pressure has come down he felt better.  However there is question this pressure may in fact be on the low side now.  He has not had syncope or presyncope.  Current Medications (verified): 1)  Warfarin Sodium 5 Mg Tabs (Warfarin Sodium) .... Use As Directed By Anticoagulation Clinic 2)  Metoprolol Tartrate 25 Mg Tabs (Metoprolol Tartrate) .... Take One Tablet By Mouth Twice A Day 3)  Aspirin 81 Mg Tbec (Aspirin) .... Take One Tablet By Mouth Daily 4)  Proscar 5 Mg Tabs (Finasteride) .Marland Kitchen.. 1 Tab By Mouth Once Daily 5)  Flomax 0.4 Mg Xr24h-Cap (Tamsulosin Hcl) .Marland Kitchen.. 1 Tab By Mouth Once Daily 6)  Multivitamins   Tabs (Multiple Vitamin) .... Daily 7)  Celebrex 100 Mg Caps (Celecoxib) .... As Needed 8)  Diovan Hct 320-25 Mg Tabs (Valsartan-Hydrochlorothiazide) .Marland Kitchen.. 1 Once Daily 9)  Amlodipine Besylate 10 Mg Tabs (Amlodipine Besylate) .... Take One Tablet By Mouth Daily 10)  Alprazolam 0.25 Mg Tabs (Alprazolam) .... 1/4 As Needed 11)  Allegra 180 Mg Tabs (Fexofenadine Hcl) .... (Seasonal For Allergies)  Allergies (verified): No Known Drug Allergies  Past History:  Past Medical History: St. Jude mechanical prosthesis (aortic valve) in 2002. Marland Kitchen..echo.. march, 2009... valve working well. Coumadin.  Hypertension   BP higher than usual 04/19/2010...amlopdipine increased by telephone. GERD treated.  EF  55-65%... echo... march, 2009 No significant coronary  disease....catheterization.... 2002  Question of soft carotid bruits in the past but no abnormalities  Decreased hearing in his right ear  Review of Systems       Patient denies fever, chills, headache, sweats, rash, change in vision, change in hearing, chest pain, cough, nausea vomiting, urinary symptoms.  All other systems are reviewed and are negative.  Vital Signs:  Patient profile:   77 year old male Height:      71 inches Weight:      200 pounds Pulse rate:   50 / minute BP sitting:   135 / 72  (left arm) Cuff size:   regular  Vitals Entered By: Burnett Kanaris, CNA (May 10, 2010 3:01 PM)  Physical Exam  General:  patient looks good today. Eyes:  no xanthelasma. Neck:  no jugular venous distention. Lungs:  lungs are clear respiratory effort is nonlabored. Heart:  cardiac exam reveals S1 and S2.  No clicks or significant murmurs. Abdomen:  abdomen is soft. Extremities:  no peripheral edema. Psych:  patient is oriented to person time and place.  Affect is normal.   Impression & Recommendations:  Problem # 1:  ESSENTIAL HYPERTENSION, BENIGN (ICD-401.1)  His updated medication list for this problem includes:    Metoprolol Tartrate 25 Mg Tabs (Metoprolol tartrate) .Marland Kitchen... Take one tablet by mouth twice a day    Aspirin 81 Mg Tbec (Aspirin) .Marland Kitchen... Take one tablet by mouth daily    Diovan Hct 320-25 Mg Tabs (Valsartan-hydrochlorothiazide) .Marland Kitchen... 1 once daily  Amlodipine Besylate 10 Mg Tabs (Amlodipine besylate) .Marland Kitchen... Take one tablet by mouth daily Blood pressure is controlled.The patient will obtain a blood pressure cuff to be sure that we're getting the measurements.  He will call us and then I will decide if stay on amlodipine 10 mg or if it should be reduced back to 5 mg.  Problem # 2:  AORTIC VALVE REPLACEMENT, HX OF (ICD-V43.3) His aortic valve is stable.  No further workup.  Patient Instructions: 1)  Your physician has requested that you regularly monitor and record  your blood pressure readings at home.  Please use the same machine at the same time of day to check your readings and record them to bring to your follow-up visit.  Give me a call with your BP readings, [phone] Heather S. 2)  Follow up in 3 months

## 2010-12-27 NOTE — Medication Information (Signed)
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 yes       Details: Stopped Metoprolol.   Recent/future dental: no  Any missed doses?: no       Is patient compliant with meds? yes       Allergies: No Known Drug Allergies  Anticoagulation Management History:      The patient is taking warfarin and comes in today for a routine follow up visit.  Positive risk factors for bleeding include an age of 34 years or older.  The bleeding index is 'intermediate risk'.  Positive CHADS2 values include History of HTN and Age > 54 years old.  The start date was 12/01/2000.  His last INR was 2.4.  Anticoagulation responsible provider: Shirlee Latch MD, [place].  INR POC: 2.1.  Cuvette Lot#: [id number].  Exp: 12/2011.    Anticoagulation Management Assessment/Plan:      The patient's current anticoagulation dose is Warfarin sodium 5 mg tabs: Use as directed by Anticoagulation Clinic.  The target INR is 2 - 3.  The next INR is due 01/02/2011.  Anticoagulation instructions were given to patient.  Results were reviewed/authorized by Cloyde Reams, RN, BSN.  He was notified by Cloyde Reams RN.         Prior Anticoagulation Instructions: INR 2.0 Today take 7.5mg s then resume 5mg s everyday except 7.5mg  on Tuesdays and Thursdays. Recheck in 4 weeks.   Current Anticoagulation Instructions: INR 2.1  Continue on same dosage 5mg  daily except 7.5mg  on Tuesdays and Thursdays.  Recheck in 5 weeks.

## 2010-12-27 NOTE — Progress Notes (Signed)
Summary: question on meds   Phone Note Call from Patient Call back at Home Phone [phone]   Caller: Patient Reason for Call: Talk to Nurse Summary of Call: pt has question re meds. pt wants to know if he take allergy meds. Initial call taken by: Roe Coombs,  December 05, 2010 9:20 AM  Follow-up for Phone Call        spoke w/pt he wanted to make sure it was ok to take Allegra advised it was as long it is plain and not allegra d Meredith Staggers, RN  December 05, 2010 11:19 AM

## 2010-12-27 NOTE — Assessment & Plan Note (Signed)
Summary: hypertension/pla      Allergies Added: NKDA  Visit Type:  Add On /  Hospital admission Primary Provider:  Yates Decamp, MD  CC:  shortness of breath.  History of Present Illness: The patient was seen in the office yesterday.  See my extensive office note dated August 29, 2010.  Patient has a [employer], dispensing.  This was placed in 2002.  At that time he had no significant coronary artery disease.  He's done very well.  Recently he's been having episodes of shortness of breath.  These episodes seem to occur around 5:00 in the morning.  He thinks that they wake him up.  After careful consideration yesterday I thought that it was unlikely that he was having acute ischemic events or that he had severe valvular malfunction.  The patient recently has had the onset of depression and anxiety.  It is possible that this is playing a significant role.  The plan was to do a followup outpatient echo to reassess his aortic prosthesis and LV function.  The patient awoke again this morning with severe symptoms.  His daughter took him to [facility].  I received a phone call from the emergency room physician who had done a very nice job with the evaluation.  CT Scan of the chest was done showing no pulmonary embolus.  It is of note that the patient is already on Coumadin.  His CT scan did show evidence of coronary calcification.  There was a moderate hiatal hernia and cholelithiasis.  The patient does not give any history compatible with symptomatic cholelithiasis.  I spoke with the emergency room physician and the patient's daughter by telephone.  We decided that we would need to see him and decided he should be admitted to the hospital for more aggressive cardiac evaluation.  I have now seen him in the office and we have decided that he should be admitted.  The first cardiac enzyme at the hospital also was normal.  Current Medications (verified): 1)  Warfarin Sodium 5 Mg Tabs  (Warfarin Sodium) .... Use As Directed By Anticoagulation Clinic 2)  Aspirin 81 Mg Tbec (Aspirin) .... Take One Tablet By Mouth Daily 3)  Proscar 5 Mg Tabs (Finasteride) .Marland Kitchen.. 1 Tab By Mouth Once Daily 4)  Flomax 0.4 Mg Xr24h-Cap (Tamsulosin Hcl) .Marland Kitchen.. 1 Tab By Mouth Once Daily 5)  Multivitamins   Tabs (Multiple Vitamin) .... Daily 6)  Celebrex 100 Mg Caps (Celecoxib) .... As Needed 7)  Diovan Hct 320-12.5 Mg Tabs (Valsartan-Hydrochlorothiazide) .... Take 1 Tablet By Mouth Once A Day 8)  Amlodipine Besylate 10 Mg Tabs (Amlodipine Besylate) .... Take One Tablet By Mouth Daily 9)  Alprazolam 0.5 Mg Tabs (Alprazolam) .... 1/4 As Needed 10)  Allegra 180 Mg Tabs (Fexofenadine Hcl) .... (Seasonal For Allergies) 11)  Fluoxetine Hcl 20 Mg Caps (Fluoxetine Hcl) .... Take 1 Tablet By Mouth Once A Day  Allergies (verified): No Known Drug Allergies  Past History:  Past Medical History: St. Jude mechanical prosthesis (aortic valve) in 2002. Marland Kitchen..echo.. march, 2009... valve working well. Coumadin.  Hypertension   BP higher than usual 04/19/2010...amlopdipine increased by telephone. GERD treated.  EF  55-65%... echo... march, 2009 No significant coronary disease....catheterization.... 2002  Question of soft carotid bruits in the past but no abnormalities  Decreased hearing in his right ear Bradycardia..... chronic and continued with no symptoms September, 2011 Depression  /  anxiety...... October, 2011II Shortness of breath   acute episodes that occur approximately  5:00 in the morning  Family History: Reviewed history and no changes required. The family history is noncontributory.  Social History: Reviewed history from 01/17/2009 and no changes required. Married  Tobacco Use - Former.   Review of Systems       Patient denies fever, chills, headache, sweats, rash, change in vision, change in hearing, cough, nausea vomiting, urinary symptoms.  All other systems are reviewed and are  negative.  Vital Signs:  Patient profile:   77 year old male Height:      71 inches Weight:      192 pounds BMI:     26.88 Pulse rate:   75 / minute BP sitting:   118 / 60  (left arm) Cuff size:   regular  Vitals Entered By: Hardin Negus, RMA (August 30, 2010 2:47 PM)  Physical Exam  General:  The patient is stable in general. Head:  head is atraumatic. Eyes:  no xanthelasma. Neck:  no jugular venous distention. Chest Wall:  no chest wall tenderness. Lungs:  lungs are clear.  Respiratory effort is nonlabored. Heart:  cardiac exam reveals S1 and S2.  There is crisp closure of the aortic prosthesis.  No aortic insufficiency is heard. Abdomen:  abdomen is soft. Msk:  no musculoskeletal deformities. Extremities:  no peripheral edema. Skin:  no skin rashes. Psych:  patient is oriented to person time and place.  Affect is normal.   Impression & Recommendations:  Problem # 1:  * ACUTE SHORTNESS OF BREATH EPISODES The etiology of these episodes of acute shortness of breath is not clear.  The patient had a chest CT earlier today showing no pulmonary embolus.  He is on Coumadin.  The chest CT showed coronary calcification.  The patient did not have significant coronary disease at the time of his aortic valve replacement in 2002.  It is possible that coronary disease has progressed.  At this point there is no proof that his acute episodes of shortness of breath are ischemic.  He has not had EKG changes.  His first cardiac enzyme today was normal.  However we cannot explain these episodes.  He is therefore admitted to the hospital for a 2-D echo to reassess valvular function and cardiac catheterization to rule out severe coronary disease.  He is on Coumadin.  He will not take a dose today.  He is aware that we may have to wait several days for his INR to come down.  I've chosen not to give him vitamin K as he has a mechanical valve.  We will also monitor him during the night to see if he has  any unusual arrhythmias.  If he has an episode of shortness of breath he can be observed by seeing what his O2 sat is an hour he feels.  It remains possible that his new depression and anxiety are the major issues here.  He and his daughter are both aware of all possibilities.  Problem # 2:  * ANXIETY DEPRESSION This is certainly playing some role with his current problem.  He has been placed on Prozac by his primary physician.  This will be continued.  Problem # 3:  BRADYCARDIA (ICD-427.89)  His updated medication list for this problem includes:    Warfarin Sodium 5 Mg Tabs (Warfarin sodium) ..... Use as directed by anticoagulation clinic    Aspirin 81 Mg Tbec (Aspirin) .Marland Kitchen... Take one tablet by mouth daily    Amlodipine Besylate 10 Mg Tabs (Amlodipine besylate) .Marland Kitchen... Take  one tablet by mouth daily Historically he has had some bradycardia.  This does not appear to be a significant problem today.  EKG from the emergency room this morning shows normal sinus rhythm with a rate of 86.  Problem # 4:  * GOOD LV FUNCTION Historically the patient has had good LV function.  He will have a followup echo to reassess.  Problem # 5:  * COUMADIN THERAPY Coumadin will not be given today.  Heparin will be started when his INR is below 2.0.  Problem # 6:  AORTIC VALVE REPLACEMENT, HX OF (ICD-V43.3) The patient has a St. Jude aortic mechanical prosthesis.  Echo will be done to reassess this.

## 2010-12-27 NOTE — Miscellaneous (Signed)
  Clinical Lists Changes  Observations: Added new observation of PAST MED HX: St. Jude mechanical prosthesis (aortic valve) in 2002. Marland Kitchen..echo.. march, 2009... valve working well.  /  echo..08/2010 working well Coumadin.  Hypertension   BP higher than usual 04/19/2010...amlopdipine increased by telephone. GERD treated.  EF  55-65%... echo... march, 2009 /  EF 65%...echo...08/31/2010 No significant coronary disease....catheterization.... 2002  Question of soft carotid bruits in the past but no abnormalities  Decreased hearing in his right ear Bradycardia..... chronic and continued with no symptoms September, 2011 Depression  /  anxiety...... October, 2011II Shortness of breath   acute episodes that occur approximately 5:00 in the morning  /  hospital..08/30/2010...cath..mild CAD...good valve motion... (09/28/2010 14:52) Added new observation of PRIMARY MD: Yates Decamp, MD (09/28/2010 14:52)       Past History:  Past Medical History: St. Jude mechanical prosthesis (aortic valve) in 2002. Marland Kitchen..echo.. march, 2009... valve working well.  /  echo..08/2010 working well Coumadin.  Hypertension   BP higher than usual 04/19/2010...amlopdipine increased by telephone. GERD treated.  EF  55-65%... echo... march, 2009 /  EF 65%...echo...08/31/2010 No significant coronary disease....catheterization.... 2002  Question of soft carotid bruits in the past but no abnormalities  Decreased hearing in his right ear Bradycardia..... chronic and continued with no symptoms September, 2011 Depression  /  anxiety...... October, 2011II Shortness of breath   acute episodes that occur approximately 5:00 in the morning  /  hospital..08/30/2010...cath..mild CAD...good valve motion.Marland KitchenMarland Kitchen

## 2010-12-27 NOTE — Assessment & Plan Note (Signed)
Summary: per check out/sf  Medications Added METOPROLOL SUCCINATE 25 MG XR24H-TAB (METOPROLOL SUCCINATE) Take one tablet by mouth daily ALPRAZOLAM 0.5 MG TABS (ALPRAZOLAM) 1/4 as needed      Allergies Added: NKDA  Visit Type:  Follow-up Primary Neita Landrigan:  Yates Decamp, MD  CC:  aortic valve disease.  History of Present Illness: The patient is seen for followup of his aortic valve disease and hypertension.  He is doing well.  He is tolerating his medicines well.  Blood pressure is nicely controlled.  He continues to have mild bradycardia.  He is not symptomatic.  Current Medications (verified): 1)  Warfarin Sodium 5 Mg Tabs (Warfarin Sodium) .... Use As Directed By Anticoagulation Clinic 2)  Metoprolol Tartrate 25 Mg Tabs (Metoprolol Tartrate) .... Take One Tablet By Mouth Twice A Day 3)  Aspirin 81 Mg Tbec (Aspirin) .... Take One Tablet By Mouth Daily 4)  Proscar 5 Mg Tabs (Finasteride) .Marland Kitchen.. 1 Tab By Mouth Once Daily 5)  Flomax 0.4 Mg Xr24h-Cap (Tamsulosin Hcl) .Marland Kitchen.. 1 Tab By Mouth Once Daily 6)  Multivitamins   Tabs (Multiple Vitamin) .... Daily 7)  Celebrex 100 Mg Caps (Celecoxib) .... As Needed 8)  Diovan Hct 320-25 Mg Tabs (Valsartan-Hydrochlorothiazide) .Marland Kitchen.. 1 Once Daily 9)  Amlodipine Besylate 10 Mg Tabs (Amlodipine Besylate) .... Take One Tablet By Mouth Daily 10)  Alprazolam 0.5 Mg Tabs (Alprazolam) .... 1/4 As Needed 11)  Allegra 180 Mg Tabs (Fexofenadine Hcl) .... (Seasonal For Allergies)  Allergies (verified): No Known Drug Allergies  Past History:  Past Medical History: St. Jude mechanical prosthesis (aortic valve) in 2002. Marland Kitchen..echo.. march, 2009... valve working well. Coumadin.  Hypertension   BP higher than usual 04/19/2010...amlopdipine increased by telephone. GERD treated.  EF  55-65%... echo... march, 2009 No significant coronary disease....catheterization.... 2002  Question of soft carotid bruits in the past but no abnormalities  Decreased hearing in his  right ear Bradycardia..... chronic and continued with no symptoms September, 201 with  Review of Systems       Patient denies fever, chills, headache, sweats, rash, change in vision, change in hearing, chest pain, cough, nausea or vomiting, urinary symptoms.  All other systems are reviewed and are negative.  Vital Signs:  Patient profile:   77 year old male Height:      71 inches Weight:      195 pounds BMI:     27.30 Pulse rate:   50 / minute BP sitting:   118 / 62  (left arm) Cuff size:   regular  Vitals Entered By: Hardin Negus, RMA (July 31, 2010 11:08 AM)  Physical Exam  General:  patient looks good. Head:  head is atraumatic. Eyes:  no xanthelasma. Neck:  no jugular venous distention. Chest Wall:  no chest wall tenderness. Lungs:  lungs are clear.  Respiratory effort is nonlabored. Heart:  cardiac exam shows S1-S2.  There is crisp closure sound of his aortic prosthesis. Abdomen:  abdomen is soft. Msk:  no musculoskeletal deformities. Extremities:  no peripheral edema. Skin:  no skin rashes. Psych:  patient is oriented to person time and place.  Affect is normal.   Impression & Recommendations:  Problem # 1:  BRADYCARDIA (ICD-427.89)  His updated medication list for this problem includes:    Warfarin Sodium 5 Mg Tabs (Warfarin sodium) ..... Use as directed by anticoagulation clinic    Metoprolol Succinate 25 Mg Xr24h-tab (Metoprolol succinate) .Marland Kitchen... Take one tablet by mouth daily    Aspirin 81 Mg Tbec (  Aspirin) .Marland Kitchen... Take one tablet by mouth daily    Amlodipine Besylate 10 Mg Tabs (Amlodipine besylate) .Marland Kitchen... Take one tablet by mouth daily The patient has bradycardia.  Is not having significant symptoms I feel that we should lower his beta blocker dose.  He'll be changed to metoprolol succinate 25 mg daily. EKG is done today and reviewed by me. bradycardia is present.  Problem # 2:  * COUMADIN THERAPY Patient continues on Coumadin with no difficulties.  No  change in therapy.  Problem # 3:  ESSENTIAL HYPERTENSION, BENIGN (ICD-401.1)  His updated medication list for this problem includes:    Metoprolol Succinate 25 Mg Xr24h-tab (Metoprolol succinate) .Marland Kitchen... Take one tablet by mouth daily    Aspirin 81 Mg Tbec (Aspirin) .Marland Kitchen... Take one tablet by mouth daily    Diovan Hct 320-25 Mg Tabs (Valsartan-hydrochlorothiazide) .Marland Kitchen... 1 once daily    Amlodipine Besylate 10 Mg Tabs (Amlodipine besylate) .Marland Kitchen... Take one tablet by mouth daily His blood pressure meds have been adjusted.  Blood pressure is under good control.  His metoprolol dose will be lowered.  Problem # 4:  AORTIC VALVE REPLACEMENT, HX OF (ICD-V43.3) His  aortic valve is working well. She does not need a followup echo at this time.  Other Orders: EKG w/ Interpretation (93000)  Patient Instructions: 1)  Stop Metoprolol Tartrate 2)  Start Metoprolol Succinate 25mg  once daily  3)  Your physician wants you to follow-up in:  6 months.  You will receive a reminder letter in the mail two months in advance. If you don't receive a letter, please call our office to schedule the follow-up appointment. Prescriptions: METOPROLOL SUCCINATE 25 MG XR24H-TAB (METOPROLOL SUCCINATE) Take one tablet by mouth daily  #90 x 3   Entered by:   Meredith Staggers, RN   Authorized by:   Talitha Givens, MD, [facility]   Signed by:   Meredith Staggers, RN on 07/31/2010   Method used:   Electronically to        [facility]* (mail-order)       [address] [place]  [address]       Ph: [phone]       Fax: [phone]   RxID:   [id number]

## 2010-12-27 NOTE — Medication Information (Signed)
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 no    Recent/future dental: no  Any missed doses?: no       Is patient compliant with meds? yes       Allergies: No Known Drug Allergies  Anticoagulation Management History:      The patient is taking warfarin and comes in today for a routine follow up visit.  Positive risk factors for bleeding include an age of 75 years or older.  The bleeding index is 'intermediate risk'.  Positive CHADS2 values include History of HTN and Age > 30 years old.  The start date was 12/01/2000.  His last INR was 2.4.  Anticoagulation responsible provider: Allred MD, Fayrene Fearing.  INR POC: 2.6.  Cuvette Lot#: [id number].  Exp: 10/2011.    Anticoagulation Management Assessment/Plan:      The patient's current anticoagulation dose is Warfarin sodium 5 mg tabs: Use as directed by Anticoagulation Clinic.  The target INR is 2 - 3.  The next INR is due 10/31/2010.  Anticoagulation instructions were given to patient.  Results were reviewed/authorized by Weston Brass, PharmD.  He was notified by Weston Brass PharmD.         Prior Anticoagulation Instructions: INR 3.0   Continue taking 1 tab daily except for 1.5 tabs on Tuesday and Thursday. Recheck in 3 weeks.   Current Anticoagulation Instructions: INR 2.6  Continue same dose of 1 tablet every day except 1 1/2 tablets on Tuesday and Thursday.  Recheck INR in 4 weeks.

## 2010-12-27 NOTE — Medication Information (Signed)
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 no     Any missed doses?: yes     Details: While in hospital 10/5-10/8 had no coumadin   Is patient compliant with meds? yes      Comments: This nurse requested that the pt come back in 2 weeks to be seen but pt declined will be out of town. Would like to have it done during his next appt with Dr Myrtis Ser.  Allergies: No Known Drug Allergies  Anticoagulation Management History:      The patient is taking warfarin and comes in today for a routine follow up visit.  Positive risk factors for bleeding include an age of 75 years or older.  The bleeding index is 'intermediate risk'.  Positive CHADS2 values include History of HTN and Age > 65 years old.  The start date was 12/01/2000.  His last INR was 2.4.  Anticoagulation responsible provider: Eden Emms MD, Theron Arista.  INR POC: 3.0.  Cuvette Lot#: [id number].  Exp: 09/2011.    Anticoagulation Management Assessment/Plan:      The patient's current anticoagulation dose is Warfarin sodium 5 mg tabs: Use as directed by Anticoagulation Clinic.  The target INR is 2 - 3.  The next INR is due 09/28/2010.  Anticoagulation instructions were given to patient.  Results were reviewed/authorized by Cloyde Reams, RN, BSN.  He was notified by Benedict Needy, RN.         Prior Anticoagulation Instructions: INR 2.3  Continue taking 1 tab daily except for 1/2 tabs on Tuesday and Thursday. Recheck in 4 weeks.   Current Anticoagulation Instructions: INR 3.0   Continue  taking 1 tab daily except for 1.5 tabs on Tuesday and Thursday. Recheck in 3 weeks.

## 2011-01-02 ENCOUNTER — Encounter (INDEPENDENT_AMBULATORY_CARE_PROVIDER_SITE_OTHER): Payer: MEDICARE

## 2011-01-02 ENCOUNTER — Encounter: Payer: Self-pay | Admitting: Cardiovascular Disease

## 2011-01-02 DIAGNOSIS — Z7901 Long term (current) use of anticoagulants: Secondary | ICD-10-CM

## 2011-01-02 DIAGNOSIS — I359 Nonrheumatic aortic valve disorder, unspecified: Secondary | ICD-10-CM

## 2011-01-10 NOTE — Medication Information (Signed)
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 no    Recent/future dental: no  Any missed doses?: no       Is patient compliant with meds? yes       Allergies: No Known Drug Allergies  Anticoagulation Management History:      The patient is taking warfarin and comes in today for a routine follow up visit.  Positive risk factors for bleeding include an age of 75 years or older.  The bleeding index is 'intermediate risk'.  Positive CHADS2 values include History of HTN and Age > 75 years old.  The start date was 12/01/2000.  His last INR was 2.4.  Anticoagulation responsible provider: Cela Newcom.  INR POC: 2.2.  Cuvette Lot#: [id number].  Exp: 12/2011.    Anticoagulation Management Assessment/Plan:      The patient's current anticoagulation dose is Warfarin sodium 5 mg tabs: Use as directed by Anticoagulation Clinic.  The target INR is 2 - 3.  The next INR is due 01/30/2011.  Anticoagulation instructions were given to patient.  Results were reviewed/authorized by Cloyde Reams, RN, BSN.  He was notified by Cloyde Reams RN.         Prior Anticoagulation Instructions: INR 2.1  Continue on same dosage 5mg  daily except 7.5mg  on Tuesdays and Thursdays.  Recheck in 5 weeks.    Current Anticoagulation Instructions: INR 2.2  Continue on same dosage 1 tablet daily except 1.5 tablets on Tuesdays and Thursdays.  Recheck in 4 weeks.

## 2011-01-30 ENCOUNTER — Encounter (INDEPENDENT_AMBULATORY_CARE_PROVIDER_SITE_OTHER): Payer: Medicare Other

## 2011-01-30 ENCOUNTER — Encounter: Payer: Self-pay | Admitting: Internal Medicine

## 2011-01-30 DIAGNOSIS — I359 Nonrheumatic aortic valve disorder, unspecified: Secondary | ICD-10-CM

## 2011-02-02 ENCOUNTER — Encounter: Payer: Self-pay | Admitting: Cardiovascular Disease

## 2011-02-05 NOTE — Medication Information (Signed)
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 no    Recent/future dental: no  Any missed doses?: no       Is patient compliant with meds? yes       Allergies: No Known Drug Allergies  Anticoagulation Management History:      The patient is taking warfarin and comes in today for a routine follow up visit.  Positive risk factors for bleeding include an age of 75 years or older.  The bleeding index is 'intermediate risk'.  Positive CHADS2 values include History of HTN and Age > 75 years old.  The start date was 12/01/2000.  His last INR was 2.4.  Anticoagulation responsible provider: Bensimhon MD, Reuel Boom.  INR POC: 2.1.  Cuvette Lot#: [id number].  Exp: 11/2011.    Anticoagulation Management Assessment/Plan:      The patient's current anticoagulation dose is Warfarin sodium 5 mg tabs: Use as directed by Anticoagulation Clinic.  The target INR is 2 - 3.  The next INR is due 02/27/2011.  Anticoagulation instructions were given to patient.  Results were reviewed/authorized by Bethena Midget, RN, BSN.  He was notified by Bethena Midget, RN, BSN.         Prior Anticoagulation Instructions: INR 2.2  Continue on same dosage 1 tablet daily except 1.5 tablets on Tuesdays and Thursdays.  Recheck in 4 weeks.   Current Anticoagulation Instructions: INR 2.1 Continue 5mg s daily except 7.5mg s on Tuesdays and Thursdays, Recheck in 4 weeks.

## 2011-02-06 LAB — CBC
MCH: 30.9 pg (ref 26.0–34.0)
MCHC: 34.8 g/dL (ref 30.0–36.0)
MCHC: 35.2 g/dL (ref 30.0–36.0)
Platelets: 240 10*3/uL (ref 150–400)
RDW: 13.5 % (ref 11.5–15.5)

## 2011-02-06 LAB — PROTIME-INR
INR: 2.04 — ABNORMAL HIGH (ref 0.00–1.49)
INR: 2.06 — ABNORMAL HIGH (ref 0.00–1.49)
Prothrombin Time: 23.5 seconds — ABNORMAL HIGH (ref 11.6–15.2)

## 2011-02-06 LAB — BASIC METABOLIC PANEL
Calcium: 8.8 mg/dL (ref 8.4–10.5)
GFR calc Af Amer: >60 mL/min (ref >60)
GFR calc non Af Amer: >60 mL/min (ref >60)
Sodium: 129 mEq/L — ABNORMAL LOW (ref 135–145)

## 2011-02-06 LAB — TSH: TSH: 1.929 u[IU]/mL (ref 0.350–4.500)

## 2011-02-06 LAB — CARDIAC PANEL(CRET KIN+CKTOT+MB+TROPI)
Relative Index: 1.9 (ref 0.0–2.5)
Relative Index: INVALID (ref 0.0–2.5)
Total CK: 118 U/L (ref 7–232)
Total CK: 71 U/L (ref 7–232)
Total CK: 88 U/L (ref 7–232)
Troponin I: 0.03 ng/mL (ref 0.00–0.06)
Troponin I: 0.04 ng/mL (ref 0.00–0.06)

## 2011-02-06 LAB — HEPARIN LEVEL (UNFRACTIONATED): Heparin Unfractionated: 0.3 IU/mL (ref 0.30–0.70)

## 2011-02-11 ENCOUNTER — Encounter: Payer: Self-pay | Admitting: Cardiology

## 2011-02-11 ENCOUNTER — Ambulatory Visit (INDEPENDENT_AMBULATORY_CARE_PROVIDER_SITE_OTHER): Payer: Medicare Other | Admitting: Cardiology

## 2011-02-11 DIAGNOSIS — I359 Nonrheumatic aortic valve disorder, unspecified: Secondary | ICD-10-CM

## 2011-02-19 ENCOUNTER — Encounter: Payer: Self-pay | Admitting: Cardiology

## 2011-02-19 DIAGNOSIS — Z954 Presence of other heart-valve replacement: Secondary | ICD-10-CM

## 2011-02-19 DIAGNOSIS — I359 Nonrheumatic aortic valve disorder, unspecified: Secondary | ICD-10-CM

## 2011-02-21 NOTE — Assessment & Plan Note (Signed)
Summary: follow up/per pt call/mj/kl      Allergies Added: NKDA  Visit Type:  Follow-up Primary Provider:  Yates Decamp, MD  CC:  aortic valve replacement.  History of Present Illness: The patient is seen back in he looks great.  In the fall of 2011 he had recurrent episodes of shortness of breath.  These generally occur at 5:00 in the morning.  He was hospitalized and eventually repeat catheterization was done to be sure that he was having ischemia.  His echo showed that his mechanical prosthesis was working well in the aortic position.  Ejection fraction is 60%.  Catheterization in October, 2011 revealed only mild coronary disease.  It was felt that his symptoms were related to anxiety.  Fortunately this is greatly improved and he has no significant problems.  Preventive Screening-Counseling & Management  Caffeine-Diet-Exercise     Does Patient Exercise: no      Drug Use:  no.    Current Medications (verified): 1)  Warfarin Sodium 5 Mg Tabs (Warfarin Sodium) .... Use As Directed By Anticoagulation Clinic 2)  Aspirin 81 Mg Tbec (Aspirin) .... Take One Tablet By Mouth Daily 3)  Proscar 5 Mg Tabs (Finasteride) .Marland Kitchen.. 1 Tab By Mouth Once Daily 4)  Flomax 0.4 Mg Xr24h-Cap (Tamsulosin Hcl) .Marland Kitchen.. 1 Tab By Mouth Once Daily 5)  Multivitamins   Tabs (Multiple Vitamin) .... Daily 6)  Celebrex 100 Mg Caps (Celecoxib) .... As Needed 7)  Diovan Hct 320-12.5 Mg Tabs (Valsartan-Hydrochlorothiazide) .... Take 1 Tablet By Mouth Once A Day 8)  Amlodipine Besylate 10 Mg Tabs (Amlodipine Besylate) .... Take One Tablet By Mouth Daily 9)  Alprazolam 0.5 Mg Tabs (Alprazolam) .... 1/4 As Needed 10)  Allegra 180 Mg Tabs (Fexofenadine Hcl) .... (Seasonal For Allergies) 11)  Fluoxetine Hcl 20 Mg Caps (Fluoxetine Hcl) .... Take 1 Tablet By Mouth Once A Day  Allergies (verified): No Known Drug Allergies  Past History:  Past Medical History: St. Jude mechanical prosthesis (aortic valve) in 2002.  Marland Kitchen..echo.. march, 2009... valve working well.  /  echo..08/2010 working well Coumadin.  Hypertension   BP higher than usual 04/19/2010...amlopdipine increased by telephone. GERD treated.  EF  55-65%... echo... march, 2009 /  EF 65%...echo...08/31/2010 No significant coronary disease....catheterization.... 2002  /  catheterization August 30, 2010... mild CAD.Marland Kitchen good valve function Question of soft carotid bruits in the past but no abnormalities  Decreased hearing in his right ear Bradycardia..... chronic and continued with no symptoms September, 2011,, Depression  /  anxiety...... October, 2011II Shortness of breath   October, 2011.episodes at 5 AM... eventually felt to be anxiety... after complete workup including catheterization  /   patient greatly improved with anxiety medications November, 2011  Social History: Married  Tobacco Use - Former.  Retired  Alcohol Use - yes -- social Regular Exercise - no Drug Use - no Does Patient Exercise:  no Drug Use:  no  Review of Systems       Patient denies fever, chills, headache, sweats, rash, change in vision, change in hearing, chest pain, cough, nausea vomiting, urinary symptoms.  All other systems are reviewed and are negative.  Vital Signs:  Patient profile:   78 year old male Height:      71 inches Weight:      195 pounds BMI:     27.30 Pulse rate:   61 / minute BP sitting:   150 / 80  (right arm) Cuff size:   regular  Vitals Entered By:  Hardin Negus, RMA (February 11, 2011 11:52 AM)  Physical Exam  General:  he looks good and he lost a few pounds. Eyes:  no xanthelasma. Neck:  no jugular venous distention. Lungs:  lungs are clear.  Respiratory effort is nonlabored. Heart:  cardiac exam reveals an S1-S2.  There is a crisp closure sound of the aortic prosthesis . Abdomen:  abdomen is soft. Extremities:  no peripheral edema. Psych:  patient is oriented to person time and place.  Affect is normal.   Impression &  Recommendations:  Problem # 1:  * ACUTE SHORTNESS OF BREATH EPISODES The issue of the shortness of breath is resolved.  This was related to anxiety  Problem # 2:  * CAROTID BRUITS BUT NO STENOSIS BY DOPPLER I. see him in follow up in the future we will consider whether a followup carotid Doppler should be done.  Problem # 3:  * COUMADIN THERAPY He will continue on Coumadin indefinitely for his mechanical aortic valve prosthesis.  Problem # 4:  AORTIC VALVE REPLACEMENT, HX OF (ICD-V43.3) his aortic valve placement works well.  No further workup.  Patient Instructions: 1)  Your physician wants you to follow-up in: 1 year.  You will receive a reminder letter in the mail two months in advance. If you don't receive a letter, please call our office to schedule the follow-up appointment.

## 2011-02-27 ENCOUNTER — Ambulatory Visit (INDEPENDENT_AMBULATORY_CARE_PROVIDER_SITE_OTHER): Payer: Medicare Other | Admitting: Emergency Medicine

## 2011-02-27 DIAGNOSIS — Z954 Presence of other heart-valve replacement: Secondary | ICD-10-CM

## 2011-02-27 DIAGNOSIS — I359 Nonrheumatic aortic valve disorder, unspecified: Secondary | ICD-10-CM

## 2011-02-27 LAB — POCT INR: INR: 2.1

## 2011-02-27 NOTE — Patient Instructions (Signed)
Continue on same dosage 5mg  daily except 7.5mg  on Tuesdays and Thursdays.  Recheck in 4 weeks.

## 2011-03-27 ENCOUNTER — Ambulatory Visit (INDEPENDENT_AMBULATORY_CARE_PROVIDER_SITE_OTHER): Payer: Medicare Other | Admitting: Emergency Medicine

## 2011-03-27 DIAGNOSIS — I359 Nonrheumatic aortic valve disorder, unspecified: Secondary | ICD-10-CM

## 2011-03-27 DIAGNOSIS — Z954 Presence of other heart-valve replacement: Secondary | ICD-10-CM

## 2011-04-09 NOTE — Assessment & Plan Note (Signed)
[facility]                            CARDIOLOGY OFFICE NOTE   NAME:[name], [name]                      MRN:          [id number]  DATE:02/11/2008                            DOB:          Dec 30, 1930    [name] is doing very well.  He is status post mechanical aortic  valve prosthesis in the past.  He has done well.  He tolerates his  Coumadin well.  He goes about full activities.  He is not having chest  pain or shortness of breath.   I saw him last in March of 2008.   PAST MEDICAL HISTORY:  ALLERGIES:  PHENERGAN.   MEDICATIONS:  Coumadin, Lopressor, aspirin, multivitamin, Proscar,  Diovan, hydrochlorothiazide, and Flomax.   OTHER MEDICAL PROBLEMS:  See the list below.   REVIEW OF SYSTEMS:  He is feeling well, and his review of systems is  negative.   PHYSICAL EXAM:  VITAL SIGNS:  Blood pressure is 130/70.  The pulse of  60.  GENERAL:  The patient is oriented to person, time and place.  Affect is  normal.  HEENT:  Reveals no xanthelasma.  He has normal extraocular motion.  There are no carotid bruits.  There is no jugular venous distention.  LUNGS:  Clear.  Respiratory effort is not labored.  CARDIAC:  Exam reveals an S1 with an S2.  There is a crisp closure sound  of the aortic prosthesis.  There is no AI heard.  ABDOMEN:  The abdomen is soft.  There are no masses or bruits.  There is  no significant peripheral edema.   EKG reveals sinus rhythm.   [name] is stable.   PROBLEMS:  Include:  1. Status post St. Jude mechanical prosthesis in 2002.  2. Long-term Coumadin.  3. Hypertension treated.  4. GERD treated.  5. Good LV function.  6. No significant coronary disease.  7. Question of soft carotid bruits in the past but no abnormalities      found.  8. Decreased hearing in his right ear.   The patient's last echo was in 2005.  It is now time to re-assess LV  function and his aortic prostatic function.  I will see him back in  one  year for follow-up.     Luis Abed, MD, [facility]  Electronically Signed    JDK/MedQ  DD: 02/11/2008  DT: 02/11/2008  Job #: [id number]

## 2011-04-09 NOTE — Assessment & Plan Note (Signed)
[facility]                            CARDIOLOGY OFFICE NOTE   NAME:[name], [name]                      MRN:          [id number]  DATE:01/18/2009                            DOB:          [birth year]    Mr. Preece is doing very well.  He has a St. Jude aortic prosthesis  placed in 2002.  We have a followup echo from 2009 showing good valvular  function.  He is on Coumadin and does well.  His Coumadin is followed in  the [place] office.  He has noted that his blood pressure has been  little bit higher at home lately.  We will adjust his meds and then had  been calling blood pressure followup.   PAST MEDICAL HISTORY:   ALLERGIES:  PHENERGAN.   MEDICATIONS:  Coumadin, Lopressor, aspirin, multivitamin, Prostar,  Diovan, hydrochlorothiazide 160/25, and Flomax.   OTHER MEDICAL PROBLEMS:  See the list in the EMR.   REVIEW OF SYSTEMS:  He has no fevers or chills.  There are no headaches.  He is not having any skin rashes.  He has no GI or GU symptoms.  His  review of systems is negative.   PHYSICAL EXAMINATION:  VITAL SIGNS:  Blood pressure is 160/82.  His  pulse is 51.  GENERAL:  The patient is oriented to person, time, and place.  Affect is  normal.  HEENT:  No xanthelasma.  He has normal extraocular motion.  There are no  carotid bruits.  There is no jugular venous distention.  LUNGS:  Clear.  Respiratory effort is not labored.  CARDIAC:  Crisp aortic prosthetic closure sound.  There is a soft  outflow murmur.  There is no aortic insufficiency.  ABDOMEN:  Soft.  He has no peripheral edema.   EKG reveals sinus bradycardia and no significant change.   Problems are listed in the EMR flow sheet.   PAST MEDICAL HISTORY:  1. Status post St. Jude mechanical prosthesis 2002, stable.  2. Long-term Coumadin, stable.  3. Hypertension.  We will increase his Diovan dosing.  I will see him      back in 1 year for followup.     Luis Abed, MD,  [facility]  Electronically Signed   JDK/MedQ  DD: 01/18/2009  DT: 01/19/2009  Job #: [id number]

## 2011-04-12 NOTE — Op Note (Signed)
[facility]  Patient:    [name], [name]                      MRN: [id number] Proc. Date: 04/01/00 Adm. Date:  [id number] Attending:  Thyra Breed CC:         Kerrin Champagne, M.D.                           Operative Report  PROCEDURE:  Lumbar epidural steroid injection.  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  INTERVAL HISTORY:  The patient continues to note overall improvement.  He is three weeks into the glucosamine and has not noticed a great deal of difference there.  PHYSICAL EXAMINATION:  VITAL SIGNS:  Initial blood pressure 146/107.  On immediate repeat was 157/67, heart rate 65, respirations 16, O2 saturation 99%, temperature 96.6 degrees. Pain level is 3/10.  NEUROLOGIC:  Examination is grossly unchanged from previously.  DESCRIPTION OF PROCEDURE:  After informed consent was obtained, the patient was  obtained in the sitting position and monitored.  His back was prepped with Betadine x 3.  A skin wheel was raised at the L4-5 interspace with 1% lidocaine.  A #20 gauge Tuohy needle was introduced into the lumbar epidural space, and a loss of  resistance to preservative-free normal saline.  There is no CSF or blood.  Then 80 mg of Medrol in 10 ml of preservative-free normal saline was gently injected. he needle was flushed with preservative-free normal saline and removed intact.  POST-PROCEDURAL CONDITION:  Stable.  DISCHARGE INSTRUCTIONS: 1. Resume previous diet. 2. Limitation of activities per instruction sheet. 3. Continue on the current medications. 4. The patient plans to follow up with Dr. Kerrin Champagne.  He is aware    that he will need to wait approximately six months before getting another    series of epidural steroid injections, but may not need these for longer    if he continues to respond positively. DD:  04/01/00 TD:   04/02/00 Job: [id number]

## 2011-04-12 NOTE — Assessment & Plan Note (Signed)
[facility]                            CARDIOLOGY OFFICE NOTE   NAME:[name], [name]                      MRN:          [id number]  DATE:01/27/2007                            DOB:          08/14/1931    [name] is seen for cardiology followup.  I saw him last in March  2007.  He is doing well.  He does not have significant coronary disease.  He had a St. Jude aortic valve mechanical prosthesis placed in January  2002.  He has done very well.  He did have a followup in 2005 which  showed that the valve was working well.  He has not had any significant  symptoms.  He has no chest pain.  There is no shortness of breath.  There has been no syncope or presyncope.   PAST MEDICAL HISTORY:  Allergies:  PHENERGAN.   Medications:  1. Coumadin as directed.  2. Lopressor 25 b.i.d.  3. Aspirin 81.  4. Proscar 5.  5. Diovan/hydrochlorothiazide 160/25.  6. Flomax.   Other medical problems:  See the list below.   REVIEW OF SYSTEMS:  He has decreased hearing in his right ear.  This has  progressed over time.  He saw Dr. Lenard Forth in the past and there is  question that he  may need to have  his eardrum patched.  I told him  that if surgery is needed he can proceed from the cardiac viewpoint.  Of  course, we will need to monitor his Coumadin.  Also, he mentions that he  is overdue for need for colonoscopy.  He is stable for this procedure.  His Coumadin could be held for a few days before the procedure and then  restarted afterwards.   Otherwise, his review of systems is negative.   PHYSICAL EXAMINATION:  VITAL SIGNS:  Weight is 211 pounds.  This is  decreased from 220 last year.  He hopes to get it down to 200.  Blood  pressure is 145/66 with a pulse of 48.  The patient is oriented to  person, time and place.  Affect is normal.  HEENT:  There is no xanthelasma.  He has normal extraocular motion.  There are no carotid bruits.  There is no jugular venous  distention.  CARDIAC:  Reveals crisp closure sound of his aortic prosthesis.  There  is an outflow murmur.  His abdomen is soft.  There are no masses or  bruits.  He has no significant peripheral edema.  He has good distal  pulses.  There are no musculoskeletal deformities.   EKG reveals sinus bradycardia.   PROBLEMS:  1. Include aortic stenosis status post St. Jude mechanical prosthesis      in January 2002.  2. Longterm Coumadin.  3. Hypertension, treated.  4. Gastroesophageal reflux disease.  5. Good left ventricular function.  6. No significant coronary disease by catheterization in 2002.  7. Question of carotid bruits in the past but no significant      abnormalities found.  8. Decreased hearing in his right ear.  9. Need for colonoscopy.  Cardiac status is stable.  I will see him back for cardiology followup  in 1 year.  We will help to oversee his Coumadin if needed at the time  of any procedures.     [name] Abed, MD, [facility]  Electronically Signed    JDK/MedQ  DD: 01/27/2007  DT: 01/27/2007  Job #: [id number]   cc:   Al Decant. Janey Greaser, MD  Hedwig Morton. Juanda Chance, MD

## 2011-04-12 NOTE — Procedures (Signed)
[place]  Patient:    [name], [name]                      MRN: [id number] Proc. Date: 12/02/00 Adm. Date:  [id number] Attending:  Waldo Laine CC:         Anesthesia Department   Procedure Report  PROCEDURE:  Intraoperative transesophageal echocardiogram.  INDICATIONS:  [name] is a 77 year old patient, who presents today for aortic valve replacement by Dr. Sheliah Plane.  Our plan is to place the TE probe intraoperatively for routine monitoring of cardiac function and structures, as well as for analysis of pre and postvalvular replacement.  DESCRIPTION OF PROCEDURE:  The morning of surgery, the patient was brought to the holding area, where under local sedation, pulmonary artery catheters and radial artery lines were placed.  He was then taken to the operating room for routine induction of general anesthesia.  The trachea was intubated, following which, the TEE probe was lubricated and passed oropharyngeally into the stomach, then withdrawn for imaging of the cardiac structures.  PRECARDIOPULMONARY BYPASS TE EXAMINATION:  LEFT VENTRICLE:  The left ventricular chamber is a significantly thickened symmetrically thickened chamber in the short-axis view.  Papillary muscles were well outlined.  On short-axis view also, there appeared to be some slight asymmetrical contractility noted with apparent hypokinetic and contractile thickening noted in the inferior and the septal walls.  Lateral and anterior walls appeared satisfactory in their motion in the early period in the short-axis view.  MITRAL VALVE:  This is a thickened mitral valve apparatus, there is calcium in the annulus, overall motion, function, closing appropriate.  Closing does appear to be appropriate during systolic ejection.  On Doppler examination, there is only a trace mitral regurgitant flow noted and opening revealed no obstruction to inflow from the left  atrial chamber.  LEFT ATRIUM:  This was a normal left atrial chamber.  Interatrial septum was interrogated and was intact.  The left atrial appendage was visualized and again no masses were noted.  AORTIC VALVE:  Of particular note, there was heavy calcium noted in the aortic valve.  It appeared functionally bicuspid, but may in fact be a tricuspid valve, but functionally, its motion looks bicuspid.  There is a large amount of calcium in the long-axis view demonstrated on the aortic root side.  There is calcium in the annulus of the aortic area.  On Doppler examination, there is 3 to 3.5+ turbulent jet noted during systolic contraction and a 2.5+ jet of aortic insufficiency noted during diastolic filling.  Overall motion of the valve was severely limited in its excursion.  RIGHT VENTRICLE:  Is a normal right ventricular chamber.  TRICUSPID VALVE:   Thin, normal, compliant apparatus, appears to be functioning satisfactorily.  RIGHT ATRIUM:  This is a normal right atrial chamber.  A pulmonary artery catheter was noted within.  The patient was placed on cardiopulmonary bypass, hypothermia was begun.  The diseased aortic valve was excised by Dr. Tyrone Sage and replaced with a #23 St. Jude mechanical valve.  Multiple deairing maneuvers were carried out and the patient was rewarmed and separated from cardiopulmonary bypass with the initial attempt.  POSTCARDIOPULMONARY BYPASS TE EXAMINATION: (Limited examination)  LEFT VENTRICLE:  Again, the asymmetric pattern of left ventricular contractility was noted in the left ventricle in the short-axis view with apparent hypocontractile segment septally and inferiorly.  Over time and with the addition of inotropes, there could be improved contractility,  as well as improved filling of this concentrically hypertrophied left ventricular chamber.  AORTIC VALVE:  In place of the diseased aortic valve could not be seen the perimeter apparatus of the  mechanical valve.  Mechanical leaflets could be seen somewhat, however, there were significant amounts of calcium in the annular area and due to the valve placement itself, there was significant dropout such that the actual opening and closing for the leaflets themselves could not be visualized with ease.  On Doppler examination however, it appeared to offer no obstruction to flow and appeared to open appropriately and on the aortic outflow area of the left ventricle, I was not able to demonstrate any significant regurgitant jet noted along the new replaced valve.  Multiple interrogations in both short-axis and long-axis views appeared to suggest that the valve was seated in the correct position and functioning in an entirely appropriate manner.  The rest of the cardiac examination was as previously described.  The patient was returned to the cardiac intensive care unit in stable condition. DD:  12/02/00 TD:  12/02/00 Job: [id number]

## 2011-04-12 NOTE — Cardiovascular Report (Signed)
[name]. [facility]  Patient:    [name], [name]                       MRN: [id number] Proc. Date: 12/01/00 Attending:  Veneda Melter, M.D. [facility] CC:         Luis Abed, M.D. [facility]  Al Decant. Janey Greaser, M.D.  Gwenith Daily Tyrone Sage, M.D.   Cardiac Catheterization  PROCEDURES PERFORMED: 1. Right heart catheterization. 2. Left heart catheterization. 3. Left ventriculogram. 4. Selective coronary angiography.  DIAGNOSES: 1. Trivial coronary artery disease by angiogram. 2. Normal left ventricular systolic function. 3. Normal right heart pressures. 4. Severe aortic stenosis.  HISTORY:  [name] is a 77 year old gentleman with a history of aortic stenosis.  The patient has had progressive increase in peak and mean gradients by echocardiogram.  He presents now for further cardiac assessment.  DESCRIPTION OF PROCEDURE:  Informed consent was obtained.  The patient was brought to the catheterization lab.  A 6-French arterial and 8-French venous sheath were placed using the modified Seldinger technique.  A 7.5-French PA catheter was then advanced into the pulmonary artery, and appropriate right-sided hemodynamics were obtained.  The aortic valve was visualized under fluoroscopy and was seen to be a heavily calcified valve with restrictive leaflet movement.  The pigtail catheter was introduced, and a brief attempt was made to cross the valve using this, but this was unsuccessful.  The 6-French JR4 catheter was introduced with a straight wire and was used to cross the valve.  Using an exchange-length J wire, the pigtail catheter was then positioned in the left ventricle.  Appropriate left-sided hemodynamics were then obtained, and a left ventriculogram was performed using power injections of contrast.  A 6-French JL4 diagnostic catheter was used to engage the left coronary artery, and a selective angiography was then performed using manual injections of contrast.   Due to an anterior and superior takeoff of the right coronary artery, an AL2 catheter was used to engage this, and a selective angiography was performed using manual injections of contrast.  At the termination of the case, the catheters and sheath were removed, and manual pressure was applied until adequate hemostasis was achieved.  The patient tolerated the procedure well.  Findings are as follows:  FINDINGS: 1. Right heart catheterization:  R = 10/10, mean = 9.  RV = 27/4.  PA = 26/11,    mean = 17.  Pulmonary capillary wedge pressure was 15/14, mean = 13.    Cardiac output was 4.7 liters per minute with a cardiac index of 2.2 liters    per minute per m sq by thermodilution.  Cardiac output and index were 6.1    and 2.9, respectively, by Fick method.  LV pressure was 220/10.  Aortic was    150/71.  LVEDP equals 15.  Aortic valve peak gradient was 70 mmHg.  Mean    gradient 49 mmHg.  Mitral valve gradient was 1.5 mmHg with a mitral valve    area of 5.7 sq cm.  PA saturation was 73%.  Aortic saturation was 91%.    Hemoglobin was 15 mm/dl.  Aortic valve area was 0.73 sq cm. 2. Left heart catheterization    a. Left main trunk:  Large caliber vessel.  Mild irregularities.    b. LAD:  This is a large caliber vessel that provides a bifurcating first       diagonal branch in the proximal segment.  The LAD system has  mild       diffuse disease with narrowings of not greater than 30%.    c. Left circumflex artery:  This is a large caliber vessel that provides a       small first marginal branch in the proximal segment and a large distal       marginal.  The left circumflex system has mild irregularities.    d. Right coronary artery:  Is dominant.  This is a large caliber vessel       that provides a posterior descending artery and 2 posterior ventricular       branches in its terminal segment.  There are luminal irregularities in       the right coronary system.    e. LV:  Normal end systolic  and end diastolic dimensions.  Overall left       ventricular function is well preserved.  Ejection fraction of greater       than 55%.  No mitral regurgitation.  ASSESSMENT AND PLAN:  [name] is a 77 year old gentleman with critical aortic stenosis, normal left ventricular function, and trivial coronary artery disease by angiogram.  The patient is being assessed for aortic valve replacement.  DD:  12/01/00 TD:  12/01/00 Job: [id number]

## 2011-04-12 NOTE — Discharge Summary (Signed)
[place]. [facility]  Patient:    [name], [name]                      MRN: [id number] Adm. Date:  [id number] Disc. Date: [id number] Attending:  Waldo Laine Dictator:   Delton See, P.A. CC:         Luis Abed, M.D. [facility]                           Discharge Summary  PROBLEM LIST AT TIME OF DISCHARGE: 1.  Chest pain with negative enzymes.  Pain felt to be     musculoskeletal. 2.  Moderate to severe aortic stenosis. 3.  Hypertension with follow up recommended. 4.  Benign prostatic hypertrophy. 5.  Remote tobacco history.  DISCHARGE MEDICATIONS: 1.  Cardura 8 mg q.d. 2.  Aspirin one q.d.  SPECIAL INSTRUCTIONS:  The patient was told to avoid any heavy lifting or straining.  His activity was otherwise to be as tolerated.  DIET:  He is to continue on the same diet he was on before.  FOLLOW UP:  He is to follow up with Dr. Myrtis Ser in 1-2 weeks.  HISTORY OF PRESENT ILLNESS:  This is a 77 year old married male, father of three and grandfather of five, with a remote tobacco history.  The patient works as an Advertising account planner.  He has a history of moderate to severe aortic stenosis and is followed by Luis Abed, M.D. [facility].  His last echo was in the fall of 1997. On 03/19/97 the patient had been working as a Agricultural consultant at [employer] and had been lifting heavy buckets of ice most of the day.  That evening at approximately 10 p.m. while in bed he developed chest pain which waxed and waned for approximately four hours.  He eventually presented to the emergency room and was seen by Richard A. Alanda Amass, M.D. [facility] at approximately 3 a.m. on 03/20/97.  Dr. Alanda Amass felt that the pain was probably musculoskeletal, however he felt the patient should be admitted to rule out myocardial infarction.  PAST MEDICAL HISTORY:  Significant for aortic stenosis, moderate to severe, history of hypertension, history of BPH, and remote tobacco  history.  ALLERGIES: No known drug allergies.  MEDICATIONS ON ADMISSION: 1.  Cardura 8 mg q.d. 2.  Aspirin 81 mg q.d.  SOCIAL HISTORY:  The patient is married and works as an Advertising account planner.  He lives in [place] and has a remote tobacco history.  HOSPITAL COURSE:  As noted, this patient was admitted at approximately 3 a.m. with chest pain and history of aortic stenosis.  The patient was seen the following day on rounds and was feeling much better, although he had some residual chest discomfort with palpation of the left side of his chest.  The patients blood pressure was mildly elevated at 164/100.  The situation was discussed between Dr. Alanda Amass and Dr. Myrtis Ser and the decision was made to discharge the patient with early follow up with Dr. Myrtis Ser back in the office.  The patient had just recently seen Dr. Myrtis Ser in the office several days prior to this admission. It was felt that a repeat echo might be indicated.  LABORATORY DATA:   His CK-MB enzymes and troponins were negative.  A basic metabolic panel was within normal limits.  A CBC was within normal limits.  Urinalysis was negative. DD:  03/20/97 TD:  03/20/97 Job:  [id number]

## 2011-04-12 NOTE — Op Note (Signed)
[place]. [facility]  Patient:    [name], [name]                      MRN: [id number] Proc. Date: 12/02/00 Adm. Date:  [id number] Attending:  Waldo Laine CC:         Luis Abed, M.D. [facility]   Operative Report  PREOPERATIVE DIAGNOSIS:  Critical aortic stenosis.  POSTOPERATIVE DIAGNOSIS:  Critical aortic stenosis.  OPERATION:  Aortic valve replacement with #23 St. Jude mechanical aortic valve prosthesis.  SURGEON:  Gwenith Daily. Tyrone Sage, M.D.  FIRST ASSISTANT:  Loura Pardon, P.A.  BRIEF HISTORY:  The patient is a 77 year old male with long standing history of a murmur, who has been followed by Dr. Myrtis Ser with known aortic stenosis. Over the past several years serial echocardiograms have revealed progressive increase in aortic valve gradient with estimated valve area at 0.7.  The patient denies significant symptoms though on close questioning has had increasing shortness of breath with exertion.  Because of his critical aortic stenosis, documented worsening on serial echocardiograms and the onset of symptoms of dyspnea on exertion aortic valve replacement was recommended to the patient.  The patient agreed and signed informed consent.  Valve choices were discussed with the patient.  He had no contraindications to Coumadin. Cardiac catheterization was performed which did not reveal any significant coronary disease and confirmed the diagnosis of critical aortic stenosis with a valve area of 0.7.  DESCRIPTION OF PROCEDURE:  With Swan-Ganz and arterial line monitors placed, the patient underwent general endotracheal anesthesia without incident.  Skin of the chest and legs was prepped with Betadine and draped in the usual sterile manner.  A transesophageal echocardiogram probe was placed and showed significant left ventricular hypertrophy, significant calcification of the aortic valve, question of bicuspid valve.  A median sternotomy was  performed. Pericardium was opened.  The patient overall appeared to have normal left ventricular function but did have obvious significant left ventricular hypertrophy.  He was systemically heparinized.  The ascending aorta and the right atrium were cannulated and the aortic root bent cardioplegia needle was introduced into the ascending aorta.  The patient was placed on cardiopulmonary bypass at 2.4 liters per minute per meter square.  The patients body temperature was cooled to 30 degrees.  Aortic cross clamp was applied.  Then 800 cc of cold blood potassium cardioplegia was administered with rapid diastolic arrest of the heart.  Myocardial septal temperature was monitored throughout the cross clamp period.  A transverse aortotomy was performed.  A right superior pulmonary vein vent had been placed.  This gave good exposure.  Good exposure of the aortic valve was obtained which showed it to be functionally a bicuspid valve but had been a tricuspid valve with complete fusion of the left and right coronary cusps and severe calcification of all three cusps.  In addition, the right coronary artery origin was shifted towards the patients left.  The significant calcification of the aortic valve was removed as the valve was excised.  The patient also had significant calcification along in the aortic wall.  The valve was sized for a 23 St. Jude valve.  Number 2 Tycron pledgeted sutures with pledgets on the ventricular surface were placed through the annulus.  The valve was secured in place and had free movement of the leaflets.  Care throughout the procedure was taken to remove all loose calcific debride.  Intermittently cold blood cardioplegia was administered directly into the  right and left coronary ostium.  The patients body temperature was rewarmed.  The aortotomy was closed with a horizontal mattress 3-0 Prolene suture over felt strips.  Prior to complete closure, air maneuvers were done  including placing the patients head in down position and allow the heart to passive fill and deair.  The aortotomy closure was completed.  A second layer of running 3-0 Prolene was used to reinforce the aortic closure.  Aortic cross clamp was removed.  A total cross clamp time of 101 minutes.  The patients body temperature was continued to warm.  He required electrical defibrillation and initially was in heart block but this quickly resolved to a sinus rhythm.  Transient ST elevations totally resolved. The patient was then ventilated and weaned from cardiopulmonary bypass. Transesophageal echocardiogram probe revealed overall good global LV function and normal function of the prostatic valve without paravalvular leak.  Total pump time was 146 minutes.  The patient was decannulated in the usual fashion. Protamine sulfate was administered.  Two atrial and two ventricular pacing wires were applied.  Pericardium was loosely reapproximated.  Two mediastinal tubes left in place.  The sternum was closed with a #6 stainless steel wire. The fascia was closed with interrupted 0 Vicryl, running 3-0 Vicryl in the subcutaneous tissue, 4-0 subcuticular stitch in the skin edges.  Dry dressings were applied.  Sponge and needle count was reported as correct at the completion of the procedure.  The patient tolerated the procedure without obvious complications and was transferred to the surgical intensive care unit for further postoperative care. DD:  12/03/00 TD:  12/03/00 Job: [id number]

## 2011-04-12 NOTE — Discharge Summary (Signed)
[place]. [facility]  Patient:    [name], [name] Visit Number: [id number] MRN: [id number]          Service Type: SUR Location: 2000 2039 [id number] Attending Physician:  Waldo Laine Dictated by:   Rutherford Guys, [name]. Admit Date:  12/01/2000 Disc. Date: 12/08/00   CC:         Luis Abed, M.D. [facility]             Robert H. Janey Greaser, M.D.                           Discharge Summary  DATE OF BIRTH:  [birth year]  CARDIOLOGIST:  Luis Abed, M.D.  PRIMARY CARE PHYSICIAN:  Al Decant. Janey Greaser, M.D.  PRIMARY ADMISSION DIAGNOSIS/DISCHARGE DIAGNOSIS:  Critical aortic stenosis.  SECONDARY ADMISSION DIAGNOSES/PREEXISTING CONDITIONS: 1. History of psoriasis. 2. Osteoarthritis. 3. Hypertension. 4. Benign prostatic hypertrophy.  PROCEDURES: 1. December 02, 2000, aortic valve replacement with [name] 23 mm St. Jude valve. 2. December 02, 2000, intraoperative transesophageal echocardiogram with Dr.    Burna Forts. 3. December 01, 2000, cardiac catheterization with Dr. Veneda Melter.  HOSPITAL COURSE:  The patient was admitted on December 01, 2000, and underwent cardiac catheterization preoperatively and had aortic valve replacement on December 02, 2000, by Dr. Tyrone Sage, after being evaluated in the office for aortic stenosis.  The patient was referred by Dr. Myrtis Ser.  Postoperative course was noted for [name] brief episode of atrial fibrillation which returned to normal sinus rhythm.  The patient was ambulated by cardiac rehab phase 1 daily, and is anticipated for discharge in the morning if PT and INR are therapeutic.  CONDITION ON DISCHARGE:  Stable and improved.  DISCHARGE MEDICATIONS: 1. Aspirin 81 mg q.d. 2. Tylox 1-2 tabs p.o. q.4-6h. p.r.n. pain. 3. Lopressor 25 mg b.i.d. 4. Coumadin with doses to be determined on PT/INR. 5. Altace 2.5 mg p.o. q.d. 6. Proscar which the patient takes at home. 7. Celebrex which the patient takes at home. 8. The patient  told to discontinue Cardura and hydrochlorothiazide for now    until changes are made on his medications. 9. The patient is to take [name] multivitamin once [name] day.  ACTIVITY AND FOLLOW-UP:  The patient is instructed not to do any driving or any lifting more than 10 lbs., to walk daily, and to continue breathing exercises.  Follow [name] low fat, low sodium diet.  He was told he could shower; clean the wounds with mild soap and water, and call the office if any wound problems arise as noted on the fact sheet that patient was given at cardiac valve surgery.  The patient is to obtain his PT/INR blood work on December 10, 2000, at Dr. Cheron Every office with [facility].  The patient is to follow up with Dr. Myrtis Ser in two weeks and to call for an appointment for that follow-up and to also call Dr. Wynelle Bourgeois office for [name] follow-up appointment in the next couple of weeks, and the patient will follow-up with Dr. Tyrone Sage in three weeks in our office at [facility].  We will call the patient to schedule that appointment. Dictated by:   Rutherford Guys, [name]. Attending Physician:  Waldo Laine DD:  12/07/00 TD:  12/07/00 Job: [id number]

## 2011-04-24 ENCOUNTER — Ambulatory Visit (INDEPENDENT_AMBULATORY_CARE_PROVIDER_SITE_OTHER): Payer: Medicare Other | Admitting: Emergency Medicine

## 2011-04-24 DIAGNOSIS — I359 Nonrheumatic aortic valve disorder, unspecified: Secondary | ICD-10-CM

## 2011-04-24 DIAGNOSIS — Z954 Presence of other heart-valve replacement: Secondary | ICD-10-CM

## 2011-04-24 LAB — POCT INR: INR: 2

## 2011-04-24 MED ORDER — AMLODIPINE BESYLATE 10 MG PO TABS: 10.0000 mg | ORAL_TABLET | Freq: Every day | ORAL | Status: DC

## 2011-05-02 ENCOUNTER — Telehealth: Payer: Self-pay | Admitting: Cardiology

## 2011-05-02 NOTE — Telephone Encounter (Signed)
Pt calling re him seeing pcp, saw pa who wants him to off his fluid pill due to low sodium

## 2011-05-02 NOTE — Telephone Encounter (Signed)
Will stop to HCTZ portion of the Diovan/HCT per his PCP secondary to low Sodium  Will call us back if has any problems

## 2011-05-02 NOTE — Telephone Encounter (Signed)
Per pt calling C/O think he depress, heart racing  nausea, doesn't feel well. Wants to be seen today. Pt aware that Dr. Myrtis Ser is not in the office today.  No chest pain  / sob.

## 2011-05-02 NOTE — Telephone Encounter (Signed)
Patient states he is depressed his heart is racing. Pt denies chest pain nor SOB. When take his B/P 176/77 heart rate was 75 beats / min. Patient states he took B/P prior taken his Norvasc 10 mg which he took after. Pt states his B/P will go down after taken medication. Patient would like to know if he can take The Xanax 0.5 mg which he is taken as needed or Prozac 30 mg which he is take daily. Patient was advised to call his PCP. Patient verbalized understanding.

## 2011-05-14 ENCOUNTER — Encounter: Payer: Self-pay | Admitting: Cardiology

## 2011-05-22 ENCOUNTER — Ambulatory Visit (INDEPENDENT_AMBULATORY_CARE_PROVIDER_SITE_OTHER): Payer: Medicare Other | Admitting: Emergency Medicine

## 2011-05-22 DIAGNOSIS — Z954 Presence of other heart-valve replacement: Secondary | ICD-10-CM

## 2011-05-22 DIAGNOSIS — I359 Nonrheumatic aortic valve disorder, unspecified: Secondary | ICD-10-CM

## 2011-05-22 LAB — POCT INR: INR: 1.9

## 2011-06-19 ENCOUNTER — Ambulatory Visit (INDEPENDENT_AMBULATORY_CARE_PROVIDER_SITE_OTHER): Payer: Medicare Other | Admitting: Emergency Medicine

## 2011-06-19 DIAGNOSIS — I359 Nonrheumatic aortic valve disorder, unspecified: Secondary | ICD-10-CM

## 2011-06-19 DIAGNOSIS — Z954 Presence of other heart-valve replacement: Secondary | ICD-10-CM

## 2011-06-19 MED ORDER — WARFARIN SODIUM 5 MG PO TABS: ORAL_TABLET | ORAL | Status: DC

## 2011-07-17 ENCOUNTER — Encounter: Payer: Medicare Other | Admitting: Emergency Medicine

## 2011-07-24 ENCOUNTER — Ambulatory Visit (INDEPENDENT_AMBULATORY_CARE_PROVIDER_SITE_OTHER): Payer: Medicare Other | Admitting: Emergency Medicine

## 2011-07-24 DIAGNOSIS — Z954 Presence of other heart-valve replacement: Secondary | ICD-10-CM

## 2011-07-24 DIAGNOSIS — I359 Nonrheumatic aortic valve disorder, unspecified: Secondary | ICD-10-CM

## 2011-08-21 ENCOUNTER — Ambulatory Visit (INDEPENDENT_AMBULATORY_CARE_PROVIDER_SITE_OTHER): Payer: Medicare Other | Admitting: Emergency Medicine

## 2011-08-21 DIAGNOSIS — Z954 Presence of other heart-valve replacement: Secondary | ICD-10-CM

## 2011-08-21 DIAGNOSIS — I359 Nonrheumatic aortic valve disorder, unspecified: Secondary | ICD-10-CM

## 2011-09-18 ENCOUNTER — Ambulatory Visit (INDEPENDENT_AMBULATORY_CARE_PROVIDER_SITE_OTHER): Payer: Medicare Other | Admitting: Emergency Medicine

## 2011-09-18 DIAGNOSIS — I359 Nonrheumatic aortic valve disorder, unspecified: Secondary | ICD-10-CM

## 2011-09-18 DIAGNOSIS — Z954 Presence of other heart-valve replacement: Secondary | ICD-10-CM

## 2011-09-18 DIAGNOSIS — Z7901 Long term (current) use of anticoagulants: Secondary | ICD-10-CM

## 2011-09-18 LAB — POCT INR: INR: 2

## 2011-10-11 ENCOUNTER — Telehealth: Payer: Self-pay | Admitting: Cardiology

## 2011-10-11 NOTE — Telephone Encounter (Signed)
Has a Saint Jude Aortic Heart Valve and believe that he is not supposed to have any MRIs. He is due to have a lower back MRI done at [facility] in [place] [phone]) and is unsure if he should have it done. He would like to speak to someone just to double check. Please feel free to leave a message on his home phone if he isn't available on his cell.

## 2011-10-11 NOTE — Telephone Encounter (Signed)
No per Dr Sanjuana Kava.  Pt was notified.

## 2011-10-14 ENCOUNTER — Ambulatory Visit: Payer: Self-pay | Admitting: Unknown Physician Specialty

## 2011-10-30 ENCOUNTER — Ambulatory Visit (INDEPENDENT_AMBULATORY_CARE_PROVIDER_SITE_OTHER): Payer: Medicare Other | Admitting: Emergency Medicine

## 2011-10-30 DIAGNOSIS — Z954 Presence of other heart-valve replacement: Secondary | ICD-10-CM

## 2011-10-30 DIAGNOSIS — I359 Nonrheumatic aortic valve disorder, unspecified: Secondary | ICD-10-CM

## 2011-10-30 DIAGNOSIS — Z7901 Long term (current) use of anticoagulants: Secondary | ICD-10-CM

## 2011-10-31 ENCOUNTER — Telehealth: Payer: Self-pay

## 2011-10-31 NOTE — Telephone Encounter (Signed)
He is taking Prednisone 5 mg taper pack; today he is down to 4 tablets daily x 3 days then he will decrease to two tablets daily ending on Tues. Nov. 11, 2012.

## 2011-11-12 ENCOUNTER — Telehealth: Payer: Self-pay | Admitting: Cardiology

## 2011-11-12 NOTE — Telephone Encounter (Signed)
New msg Pat. Called He is to get epidural shot and wants to know when he should stop coumadin please let him know

## 2011-11-12 NOTE — Telephone Encounter (Signed)
Scheduled for an epidural injection on 11/27/11.  They want him to have an inr below 1.4 but are leaving the decision about when to stop the coumadin up to Dr Myrtis Ser.  Please advise.

## 2011-11-13 ENCOUNTER — Encounter: Payer: Self-pay | Admitting: Cardiology

## 2011-11-13 DIAGNOSIS — F419 Anxiety disorder, unspecified: Secondary | ICD-10-CM | POA: Insufficient documentation

## 2011-11-13 DIAGNOSIS — R0602 Shortness of breath: Secondary | ICD-10-CM | POA: Insufficient documentation

## 2011-11-13 DIAGNOSIS — Z7901 Long term (current) use of anticoagulants: Secondary | ICD-10-CM | POA: Insufficient documentation

## 2011-11-13 DIAGNOSIS — R0989 Other specified symptoms and signs involving the circulatory and respiratory systems: Secondary | ICD-10-CM | POA: Insufficient documentation

## 2011-11-13 DIAGNOSIS — K219 Gastro-esophageal reflux disease without esophagitis: Secondary | ICD-10-CM | POA: Insufficient documentation

## 2011-11-13 DIAGNOSIS — I2510 Atherosclerotic heart disease of native coronary artery without angina pectoris: Secondary | ICD-10-CM | POA: Insufficient documentation

## 2011-11-13 DIAGNOSIS — I350 Nonrheumatic aortic (valve) stenosis: Secondary | ICD-10-CM | POA: Insufficient documentation

## 2011-11-13 DIAGNOSIS — R9430 Abnormal result of cardiovascular function study, unspecified: Secondary | ICD-10-CM | POA: Insufficient documentation

## 2011-11-13 DIAGNOSIS — F329 Major depressive disorder, single episode, unspecified: Secondary | ICD-10-CM | POA: Insufficient documentation

## 2011-11-13 DIAGNOSIS — R001 Bradycardia, unspecified: Secondary | ICD-10-CM | POA: Insufficient documentation

## 2011-11-13 DIAGNOSIS — I10 Essential (primary) hypertension: Secondary | ICD-10-CM | POA: Insufficient documentation

## 2011-11-13 DIAGNOSIS — H9190 Unspecified hearing loss, unspecified ear: Secondary | ICD-10-CM | POA: Insufficient documentation

## 2011-11-13 DIAGNOSIS — Z952 Presence of prosthetic heart valve: Secondary | ICD-10-CM | POA: Insufficient documentation

## 2011-11-13 NOTE — Telephone Encounter (Signed)
Patient has a mechnaical AVR. This needs to be coordinated with Kennon Rounds in coumadin clinic. Please talk to her.

## 2011-11-14 NOTE — Telephone Encounter (Signed)
Spoke with pt.  Explained to pt would need to do Lovenox bridge.  He has not done this before so he is going to come to the clinic in [place] on 12/26 to set up Lovenox bridge.

## 2011-11-20 ENCOUNTER — Ambulatory Visit (INDEPENDENT_AMBULATORY_CARE_PROVIDER_SITE_OTHER): Payer: Medicare Other | Admitting: *Deleted

## 2011-11-20 DIAGNOSIS — I359 Nonrheumatic aortic valve disorder, unspecified: Secondary | ICD-10-CM

## 2011-11-20 DIAGNOSIS — Z954 Presence of other heart-valve replacement: Secondary | ICD-10-CM

## 2011-11-20 DIAGNOSIS — Z7901 Long term (current) use of anticoagulants: Secondary | ICD-10-CM

## 2011-11-20 LAB — POCT INR: INR: 2.5

## 2011-11-20 MED ORDER — ENOXAPARIN SODIUM 100 MG/ML ~~LOC~~ SOLN: 90.0000 mg | Freq: Two times a day (BID) | SUBCUTANEOUS | Status: DC

## 2011-11-20 NOTE — Patient Instructions (Signed)
11/28/11- Take last dose of coumadin.  11/29/11- do nothing 11/30/11-Start taking Lovenox 90mg s subcutaneus every 12 hours- 8am and 8pm 12/01/11- Continue Lovenox injections 90mg s every 12 hours- 8am and 8pm  12/02/11- Continue Lovenox injection 90mg s every 12 hours- 8am and 8 pm  12/03/11- Continue Lovenox injection 90mg s at 8am and 8pm ( 8pm is last injection)  12/04/11- After Epidural injection restart coumadin that night with a 7.5mg s dose 12/05/11- Continue coumadin today take 10mg s  and restart Lovenox injections 90mg s 8am and 8pm.  12/06/11- Continue coumadin normal dose and continue Lovenox injections  8am and 8pm until you return to clinic in [place] on 12/09/11 (Monday)

## 2011-12-04 ENCOUNTER — Encounter: Payer: Medicare Other | Admitting: Emergency Medicine

## 2011-12-09 ENCOUNTER — Encounter: Payer: Medicare Other | Admitting: *Deleted

## 2011-12-11 ENCOUNTER — Encounter: Payer: Medicare Other | Admitting: Emergency Medicine

## 2011-12-11 ENCOUNTER — Ambulatory Visit (INDEPENDENT_AMBULATORY_CARE_PROVIDER_SITE_OTHER): Payer: Medicare Other | Admitting: Emergency Medicine

## 2011-12-11 DIAGNOSIS — Z954 Presence of other heart-valve replacement: Secondary | ICD-10-CM

## 2011-12-11 DIAGNOSIS — I359 Nonrheumatic aortic valve disorder, unspecified: Secondary | ICD-10-CM

## 2011-12-11 LAB — POCT INR: INR: 2.3

## 2012-01-29 ENCOUNTER — Ambulatory Visit (INDEPENDENT_AMBULATORY_CARE_PROVIDER_SITE_OTHER): Payer: Medicare Other

## 2012-01-29 DIAGNOSIS — Z954 Presence of other heart-valve replacement: Secondary | ICD-10-CM

## 2012-01-29 DIAGNOSIS — I359 Nonrheumatic aortic valve disorder, unspecified: Secondary | ICD-10-CM

## 2012-01-29 LAB — POCT INR: INR: 1.8

## 2012-02-06 ENCOUNTER — Encounter: Payer: Self-pay | Admitting: Cardiology

## 2012-02-11 ENCOUNTER — Ambulatory Visit: Payer: Medicare Other | Admitting: Cardiology

## 2012-02-16 ENCOUNTER — Emergency Department: Payer: Self-pay | Admitting: Unknown Physician Specialty

## 2012-02-16 LAB — CBC
HGB: 14.5 g/dL (ref 13.0–18.0)
MCH: 32.1 pg (ref 26.0–34.0)
MCV: 96 fL (ref 80–100)
Platelet: 260 10*3/uL (ref 150–440)
RBC: 4.52 10*6/uL (ref 4.40–5.90)
WBC: 6.6 10*3/uL (ref 3.8–10.6)

## 2012-02-16 LAB — URINALYSIS, COMPLETE
Bacteria: NONE SEEN
Bilirubin,UR: NEGATIVE
Blood: NEGATIVE
Glucose,UR: NEGATIVE mg/dL (ref 0–75)
Ketone: NEGATIVE
Leukocyte Esterase: NEGATIVE
Ph: 7 (ref 4.5–8.0)
Protein: NEGATIVE
Specific Gravity: 1.014 (ref 1.003–1.030)
Squamous Epithelial: <1

## 2012-02-16 LAB — LIPASE, BLOOD: Lipase: 130 U/L (ref 73–393)

## 2012-02-16 LAB — COMPREHENSIVE METABOLIC PANEL
Albumin: 4.2 g/dL (ref 3.4–5.0)
Alkaline Phosphatase: 80 U/L (ref 50–136)
Bilirubin,Total: 0.8 mg/dL (ref 0.2–1.0)
Calcium, Total: 8.7 mg/dL (ref 8.5–10.1)
Co2: 28 mmol/L (ref 21–32)
EGFR (Non-African Amer.): >60
Glucose: 97 mg/dL (ref 65–99)
SGOT(AST): 33 U/L (ref 15–37)
SGPT (ALT): 22 U/L

## 2012-02-26 ENCOUNTER — Ambulatory Visit (INDEPENDENT_AMBULATORY_CARE_PROVIDER_SITE_OTHER): Payer: Medicare Other

## 2012-02-26 DIAGNOSIS — Z954 Presence of other heart-valve replacement: Secondary | ICD-10-CM

## 2012-02-26 DIAGNOSIS — I359 Nonrheumatic aortic valve disorder, unspecified: Secondary | ICD-10-CM

## 2012-02-26 MED ORDER — WARFARIN SODIUM 5 MG PO TABS: ORAL_TABLET | ORAL | Status: DC

## 2012-03-23 ENCOUNTER — Encounter: Payer: Self-pay | Admitting: Cardiology

## 2012-03-25 ENCOUNTER — Encounter: Payer: Self-pay | Admitting: Cardiology

## 2012-03-25 ENCOUNTER — Ambulatory Visit (INDEPENDENT_AMBULATORY_CARE_PROVIDER_SITE_OTHER): Payer: Medicare Other | Admitting: Cardiology

## 2012-03-25 ENCOUNTER — Ambulatory Visit (INDEPENDENT_AMBULATORY_CARE_PROVIDER_SITE_OTHER): Payer: Medicare Other | Admitting: *Deleted

## 2012-03-25 VITALS — BP 142/60 | HR 70 | Ht 71.0 in | Wt 191.8 lb

## 2012-03-25 DIAGNOSIS — R0602 Shortness of breath: Secondary | ICD-10-CM

## 2012-03-25 DIAGNOSIS — I359 Nonrheumatic aortic valve disorder, unspecified: Secondary | ICD-10-CM

## 2012-03-25 DIAGNOSIS — Z7901 Long term (current) use of anticoagulants: Secondary | ICD-10-CM

## 2012-03-25 DIAGNOSIS — R001 Bradycardia, unspecified: Secondary | ICD-10-CM

## 2012-03-25 DIAGNOSIS — F411 Generalized anxiety disorder: Secondary | ICD-10-CM

## 2012-03-25 DIAGNOSIS — Z952 Presence of prosthetic heart valve: Secondary | ICD-10-CM

## 2012-03-25 DIAGNOSIS — F419 Anxiety disorder, unspecified: Secondary | ICD-10-CM

## 2012-03-25 DIAGNOSIS — I10 Essential (primary) hypertension: Secondary | ICD-10-CM

## 2012-03-25 DIAGNOSIS — I498 Other specified cardiac arrhythmias: Secondary | ICD-10-CM

## 2012-03-25 DIAGNOSIS — Z954 Presence of other heart-valve replacement: Secondary | ICD-10-CM

## 2012-03-25 NOTE — Assessment & Plan Note (Signed)
His aortic valve prosthesis is working very well. No change in therapy.

## 2012-03-25 NOTE — Progress Notes (Signed)
HPI The patient is doing well. I had seen him last in March, 2012. He has been seen once in the [place] office also. He's doing very well. He a mechanical valve placed in the aortic position in 2002. His last echo was October 2 011 and the valve is working well. In 2011 he had some episodes of shortness of breath. He actually underwent cardiac catheterization ultimately. He had only mild coronary disease. It seemed that he was having significant symptoms from anxiety. This has been treated and he is greatly improved. He is not having any significant problems.  As part of today's evaluation I have reviewed all of my past cardiology care. I have completely updated the patient' s new electronic medical record. No Known Allergies  Current Outpatient Prescriptions  Medication Sig Dispense Refill  . ALPRAZolam (XANAX) 0.5 MG tablet Take 0.125 mg by mouth as needed.        Marland Kitchen amLODipine (NORVASC) 10 MG tablet Take 1 tablet (10 mg total) by mouth daily.  90 tablet  3  . aspirin 81 MG tablet Take 81 mg by mouth daily.        . celecoxib (CELEBREX) 100 MG capsule Take 100 mg by mouth as needed.        . fexofenadine (ALLEGRA) 180 MG tablet Take 180 mg by mouth as needed. For seasonal allergies       . finasteride (PROSCAR) 5 MG tablet Take 5 mg by mouth daily.        Marland Kitchen FLUoxetine (PROZAC) 20 MG capsule Take 20 mg by mouth daily.        . Multiple Vitamin (MULTIVITAMIN) tablet Take 1 tablet by mouth daily.        . Tamsulosin HCl (FLOMAX) 0.4 MG CAPS Take 0.4 mg by mouth daily.        . temazepam (RESTORIL) 15 MG capsule Take 1 tablet by mouth at bedtime.      . valsartan (DIOVAN) 320 MG tablet Take 320 mg by mouth daily.        Marland Kitchen warfarin (COUMADIN) 5 MG tablet Take as directed by Anticoagulation clinic  165 tablet  1    History   Social History  . Marital Status: Married    Spouse Name: N/A    Number of Children: N/A  . Years of Education: N/A   Occupational History  . Retired    Social  History Main Topics  . Smoking status: Former Games developer  . Smokeless tobacco: Not on file  . Alcohol Use: Yes     Social  . Drug Use: No  . Sexually Active:    Other Topics Concern  . Not on file   Social History Narrative   No regular exercise.    No family history on file.  Past Medical History  Diagnosis Date  . GERD (gastroesophageal reflux disease)   . Bradycardia     chronic, no symptoms 07/2010  . Depression     and Anxiety,  08/2010  . Anxiety 10/11  . SOB (shortness of breath) 10/11    08/2010,Episodes at 5 AM, eventually felt to be anxiety, after complete workup including catheterization, pt greatly improved with anxiety meds 11/11  . Hypertension     BP higher than usual 04/19/10; amlodipine increased by telephone  . Decreased hearing     Right ear  . Coronary artery disease     mild, cath, 08/2010  . Aortic stenosis     AVR, 2002, Mechanical  .  S/P AVR     St. Jude. mechanical 2002, echo 08/2010 working well  . Warfarin anticoagulation     mechanical AVR  . Ejection fraction     EF 60%,  echo. 08/2010  . Carotid bruit     dopplers in past, no abnormalities    Past Surgical History  Procedure Date  . Valve replacement 1/02    Aortic; echo 3/09 valve working well; echo 10/11 working well; put on Coumadin  . Cardiac catheterization     ROS Patient denies fever, chills, headache, sweats, rash, change in vision, change in hearing, chest pain, cough, nausea vomiting, urinary symptoms. All other systems are reviewed and are negative.  PHYSICAL EXAM Patient is oriented to person time and place. Affect is normal. Head is atraumatic. There is no jugular venous distention. Lungs are clear. Respiratory effort is nonlabored. Cardiac exam reveals S1 and S2. There is a crisp closure sound of the aortic prosthesis. The abdomen is soft. There is no peripheral edema. There no musculoskeletal deformities. There are no skin rashes.     Filed Vitals:   03/25/12 1518  BP:  142/60  Pulse: 70  Height: 5\' 11"  (1.803 m)  Weight: 191 lb 12.8 oz (87 kg)    EKG  ASSESSMENT & PLAN

## 2012-03-25 NOTE — Assessment & Plan Note (Signed)
He continues on Coumadin.  No change in therapy. 

## 2012-03-25 NOTE — Assessment & Plan Note (Signed)
He is not having any significant shortness of breath. No change in therapy. 

## 2012-03-25 NOTE — Patient Instructions (Signed)
Your physician wants you to follow-up in: 1 year. You will receive a reminder letter in the mail two months in advance. If you don't receive a letter, please call our office to schedule the follow-up appointment.  

## 2012-03-25 NOTE — Assessment & Plan Note (Signed)
His anxiety problem is greatly improved. No change in therapy.

## 2012-03-25 NOTE — Assessment & Plan Note (Signed)
Blood pressures controlled. No change in therapy. 

## 2012-03-25 NOTE — Assessment & Plan Note (Signed)
He is not having any significant problems from bradycardia. No change in therapy.

## 2012-04-07 ENCOUNTER — Other Ambulatory Visit: Payer: Self-pay | Admitting: *Deleted

## 2012-04-07 MED ORDER — AMLODIPINE BESYLATE 10 MG PO TABS: 10.0000 mg | ORAL_TABLET | Freq: Every day | ORAL | Status: DC

## 2012-04-07 NOTE — Telephone Encounter (Signed)
Fax Received. Refill Completed. Shavell Nored Chowoe (R.M.A)   

## 2012-04-15 ENCOUNTER — Ambulatory Visit (INDEPENDENT_AMBULATORY_CARE_PROVIDER_SITE_OTHER): Payer: Medicare Other

## 2012-04-15 DIAGNOSIS — Z952 Presence of prosthetic heart valve: Secondary | ICD-10-CM

## 2012-04-15 DIAGNOSIS — I359 Nonrheumatic aortic valve disorder, unspecified: Secondary | ICD-10-CM

## 2012-04-15 DIAGNOSIS — I350 Nonrheumatic aortic (valve) stenosis: Secondary | ICD-10-CM

## 2012-04-15 DIAGNOSIS — Z954 Presence of other heart-valve replacement: Secondary | ICD-10-CM

## 2012-04-15 DIAGNOSIS — Z7901 Long term (current) use of anticoagulants: Secondary | ICD-10-CM

## 2012-04-15 LAB — POCT INR: INR: 2.5

## 2012-05-13 ENCOUNTER — Ambulatory Visit (INDEPENDENT_AMBULATORY_CARE_PROVIDER_SITE_OTHER): Payer: Medicare Other

## 2012-05-13 DIAGNOSIS — Z7901 Long term (current) use of anticoagulants: Secondary | ICD-10-CM

## 2012-05-13 DIAGNOSIS — Z952 Presence of prosthetic heart valve: Secondary | ICD-10-CM

## 2012-05-13 DIAGNOSIS — I350 Nonrheumatic aortic (valve) stenosis: Secondary | ICD-10-CM

## 2012-05-13 DIAGNOSIS — Z954 Presence of other heart-valve replacement: Secondary | ICD-10-CM

## 2012-05-13 DIAGNOSIS — I359 Nonrheumatic aortic valve disorder, unspecified: Secondary | ICD-10-CM

## 2012-06-03 ENCOUNTER — Ambulatory Visit (INDEPENDENT_AMBULATORY_CARE_PROVIDER_SITE_OTHER): Payer: Medicare Other

## 2012-06-03 DIAGNOSIS — Z7901 Long term (current) use of anticoagulants: Secondary | ICD-10-CM

## 2012-06-03 DIAGNOSIS — Z954 Presence of other heart-valve replacement: Secondary | ICD-10-CM

## 2012-06-03 DIAGNOSIS — I350 Nonrheumatic aortic (valve) stenosis: Secondary | ICD-10-CM

## 2012-06-03 DIAGNOSIS — Z952 Presence of prosthetic heart valve: Secondary | ICD-10-CM

## 2012-06-03 DIAGNOSIS — I359 Nonrheumatic aortic valve disorder, unspecified: Secondary | ICD-10-CM

## 2012-06-24 ENCOUNTER — Ambulatory Visit (INDEPENDENT_AMBULATORY_CARE_PROVIDER_SITE_OTHER): Payer: Medicare Other

## 2012-06-24 DIAGNOSIS — Z952 Presence of prosthetic heart valve: Secondary | ICD-10-CM

## 2012-06-24 DIAGNOSIS — Z7901 Long term (current) use of anticoagulants: Secondary | ICD-10-CM

## 2012-06-24 DIAGNOSIS — Z954 Presence of other heart-valve replacement: Secondary | ICD-10-CM

## 2012-06-24 DIAGNOSIS — I350 Nonrheumatic aortic (valve) stenosis: Secondary | ICD-10-CM

## 2012-06-24 DIAGNOSIS — I359 Nonrheumatic aortic valve disorder, unspecified: Secondary | ICD-10-CM

## 2012-07-19 ENCOUNTER — Ambulatory Visit: Payer: Self-pay | Admitting: Orthopaedic Surgery

## 2012-07-19 ENCOUNTER — Inpatient Hospital Stay: Payer: Self-pay | Admitting: Orthopedic Surgery

## 2012-07-19 LAB — COMPREHENSIVE METABOLIC PANEL
Albumin: 4.1 g/dL (ref 3.4–5.0)
Anion Gap: 8 (ref 7–16)
BUN: 14 mg/dL (ref 7–18)
Calcium, Total: 8.7 mg/dL (ref 8.5–10.1)
Co2: 26 mmol/L (ref 21–32)
EGFR (African American): >60
EGFR (Non-African Amer.): >60
Glucose: 110 mg/dL — ABNORMAL HIGH (ref 65–99)
Osmolality: 258 (ref 275–301)
Potassium: 4.2 mmol/L (ref 3.5–5.1)
SGOT(AST): 42 U/L — ABNORMAL HIGH (ref 15–37)
Sodium: 128 mmol/L — ABNORMAL LOW (ref 136–145)
Total Protein: 7.3 g/dL (ref 6.4–8.2)

## 2012-07-19 LAB — URINALYSIS, COMPLETE
Bacteria: NONE SEEN
Blood: NEGATIVE
Protein: NEGATIVE
RBC,UR: 2 /HPF (ref 0–5)
Specific Gravity: 1.008 (ref 1.003–1.030)
Squamous Epithelial: NONE SEEN
WBC UR: 1 /HPF (ref 0–5)

## 2012-07-19 LAB — CBC
HCT: 43.1 % (ref 40.0–52.0)
HGB: 14.9 g/dL (ref 13.0–18.0)
MCH: 31.6 pg (ref 26.0–34.0)
MCV: 92 fL (ref 80–100)
RDW: 14.2 % (ref 11.5–14.5)
WBC: 9.9 10*3/uL (ref 3.8–10.6)

## 2012-07-19 LAB — PROTIME-INR
INR: 1.9
Prothrombin Time: 22 secs — ABNORMAL HIGH (ref 11.5–14.7)

## 2012-07-19 LAB — MAGNESIUM: Magnesium: 2.1 mg/dL

## 2012-07-20 LAB — BASIC METABOLIC PANEL
BUN: 17 mg/dL (ref 7–18)
Calcium, Total: 8.2 mg/dL — ABNORMAL LOW (ref 8.5–10.1)
Chloride: 93 mmol/L — ABNORMAL LOW (ref 98–107)
Glucose: 93 mg/dL (ref 65–99)
Osmolality: 258 (ref 275–301)
Potassium: 3.9 mmol/L (ref 3.5–5.1)
Sodium: 128 mmol/L — ABNORMAL LOW (ref 136–145)

## 2012-07-20 LAB — APTT: Activated PTT: 153.3 secs — ABNORMAL HIGH (ref 23.6–35.9)

## 2012-07-21 LAB — PLATELET COUNT: Platelet: 200 10*3/uL (ref 150–440)

## 2012-07-21 LAB — APTT
Activated PTT: 67.4 secs — ABNORMAL HIGH (ref 23.6–35.9)
Activated PTT: 55.4 secs — ABNORMAL HIGH (ref 23.6–35.9)
Activated PTT: 53.8 secs — ABNORMAL HIGH (ref 23.6–35.9)

## 2012-07-21 LAB — PROTIME-INR
INR: 1.7
INR: 1.6
Prothrombin Time: 20.7 secs — ABNORMAL HIGH (ref 11.5–14.7)
Prothrombin Time: 19.3 secs — ABNORMAL HIGH (ref 11.5–14.7)

## 2012-07-22 LAB — APTT
Activated PTT: 97.7 secs — ABNORMAL HIGH (ref 23.6–35.9)
Activated PTT: 45.6 secs — ABNORMAL HIGH (ref 23.6–35.9)
Activated PTT: 36.1 secs — ABNORMAL HIGH (ref 23.6–35.9)
Activated PTT: 43.6 secs — ABNORMAL HIGH (ref 23.6–35.9)

## 2012-07-22 LAB — CBC WITH DIFFERENTIAL/PLATELET
Basophil #: 0 10*3/uL (ref 0.0–0.1)
Basophil %: 0.3 %
Eosinophil #: 0.2 10*3/uL (ref 0.0–0.7)
Eosinophil %: 1.9 %
HCT: 35 % — ABNORMAL LOW (ref 40.0–52.0)
HGB: 12.2 g/dL — ABNORMAL LOW (ref 13.0–18.0)
Lymphocyte #: 0.6 10*3/uL — ABNORMAL LOW (ref 1.0–3.6)
Lymphocyte %: 6.9 %
MCHC: 34.8 g/dL (ref 32.0–36.0)
Monocyte #: 0.8 x10 3/mm (ref 0.2–1.0)
Monocyte %: 8.6 %
Neutrophil #: 7.5 10*3/uL — ABNORMAL HIGH (ref 1.4–6.5)
Neutrophil %: 82.3 %
RBC: 3.78 10*6/uL — ABNORMAL LOW (ref 4.40–5.90)

## 2012-07-22 LAB — BASIC METABOLIC PANEL
Anion Gap: 8 (ref 7–16)
BUN: 13 mg/dL (ref 7–18)
Calcium, Total: 8.1 mg/dL — ABNORMAL LOW (ref 8.5–10.1)
Chloride: 90 mmol/L — ABNORMAL LOW (ref 98–107)
Creatinine: 0.65 mg/dL (ref 0.60–1.30)
EGFR (African American): >60
Glucose: 103 mg/dL — ABNORMAL HIGH (ref 65–99)
Osmolality: 250 (ref 275–301)
Potassium: 3.8 mmol/L (ref 3.5–5.1)
Sodium: 124 mmol/L — ABNORMAL LOW (ref 136–145)

## 2012-07-22 LAB — PROTIME-INR
INR: 1.2
INR: 1.1
Prothrombin Time: 15.8 secs — ABNORMAL HIGH (ref 11.5–14.7)
Prothrombin Time: 14.4 secs (ref 11.5–14.7)

## 2012-07-23 LAB — BASIC METABOLIC PANEL
BUN: 11 mg/dL (ref 7–18)
Chloride: 90 mmol/L — ABNORMAL LOW (ref 98–107)
Co2: 27 mmol/L (ref 21–32)
Creatinine: 0.69 mg/dL (ref 0.60–1.30)
Osmolality: 251 (ref 275–301)
Potassium: 4.1 mmol/L (ref 3.5–5.1)

## 2012-07-23 LAB — PROTIME-INR: Prothrombin Time: 14.6 secs (ref 11.5–14.7)

## 2012-07-23 LAB — APTT
Activated PTT: 106.3 secs — ABNORMAL HIGH (ref 23.6–35.9)
Activated PTT: >160 secs (ref 23.6–35.9)

## 2012-07-23 LAB — CBC WITH DIFFERENTIAL/PLATELET
Basophil #: 0 10*3/uL (ref 0.0–0.1)
Basophil %: 0.3 %
Eosinophil %: 1.1 %
HGB: 11.1 g/dL — ABNORMAL LOW (ref 13.0–18.0)
Lymphocyte %: 3.6 %
MCH: 31.7 pg (ref 26.0–34.0)
Monocyte #: 1.2 x10 3/mm — ABNORMAL HIGH (ref 0.2–1.0)
Neutrophil %: 85.1 %
Platelet: 219 10*3/uL (ref 150–440)
RBC: 3.52 10*6/uL — ABNORMAL LOW (ref 4.40–5.90)
WBC: 11.7 10*3/uL — ABNORMAL HIGH (ref 3.8–10.6)

## 2012-07-23 LAB — HEMOGLOBIN
HGB: 12.4 g/dL — ABNORMAL LOW (ref 13.0–18.0)
HGB: 12.1 g/dL — ABNORMAL LOW (ref 13.0–18.0)

## 2012-07-23 LAB — PLATELET COUNT: Platelet: 232 10*3/uL (ref 150–440)

## 2012-07-24 LAB — BASIC METABOLIC PANEL
Creatinine: 0.74 mg/dL (ref 0.60–1.30)
EGFR (African American): >60
EGFR (Non-African Amer.): >60
Glucose: 109 mg/dL — ABNORMAL HIGH (ref 65–99)
Osmolality: 253 (ref 275–301)
Potassium: 3.8 mmol/L (ref 3.5–5.1)

## 2012-07-24 LAB — PROTIME-INR: INR: 1.4

## 2012-07-24 LAB — APTT: Activated PTT: 71 secs — ABNORMAL HIGH (ref 23.6–35.9)

## 2012-07-24 LAB — HEMOGLOBIN: HGB: 9.7 g/dL — ABNORMAL LOW (ref 13.0–18.0)

## 2012-07-25 LAB — CBC WITH DIFFERENTIAL/PLATELET
Basophil %: 0.1 %
Eosinophil #: 0.2 10*3/uL (ref 0.0–0.7)
HGB: 9.2 g/dL — ABNORMAL LOW (ref 13.0–18.0)
Lymphocyte %: 9.5 %
MCH: 31.5 pg (ref 26.0–34.0)
MCHC: 34.2 g/dL (ref 32.0–36.0)
Monocyte #: 0.8 x10 3/mm (ref 0.2–1.0)
Monocyte %: 11.2 %
Neutrophil %: 75.9 %
RDW: 13.6 % (ref 11.5–14.5)
WBC: 6.8 10*3/uL (ref 3.8–10.6)

## 2012-07-25 LAB — BASIC METABOLIC PANEL
BUN: 13 mg/dL (ref 7–18)
Calcium, Total: 7.7 mg/dL — ABNORMAL LOW (ref 8.5–10.1)
Co2: 27 mmol/L (ref 21–32)
Glucose: 105 mg/dL — ABNORMAL HIGH (ref 65–99)
Osmolality: 250 (ref 275–301)
Sodium: 124 mmol/L — ABNORMAL LOW (ref 136–145)

## 2012-07-25 LAB — HEMOGLOBIN: HGB: 8.9 g/dL — ABNORMAL LOW (ref 13.0–18.0)

## 2012-07-25 LAB — APTT: Activated PTT: 84.7 secs — ABNORMAL HIGH (ref 23.6–35.9)

## 2012-07-25 LAB — PROTIME-INR
INR: 1.6
Prothrombin Time: 19.8 secs — ABNORMAL HIGH (ref 11.5–14.7)

## 2012-07-25 LAB — PLATELET COUNT: Platelet: 199 10*3/uL (ref 150–440)

## 2012-07-26 LAB — BASIC METABOLIC PANEL
BUN: 8 mg/dL (ref 7–18)
Calcium, Total: 7.9 mg/dL — ABNORMAL LOW (ref 8.5–10.1)
Co2: 26 mmol/L (ref 21–32)
Creatinine: 0.59 mg/dL — ABNORMAL LOW (ref 0.60–1.30)
EGFR (African American): >60
EGFR (Non-African Amer.): >60
Glucose: 112 mg/dL — ABNORMAL HIGH (ref 65–99)
Sodium: 125 mmol/L — ABNORMAL LOW (ref 136–145)

## 2012-07-26 LAB — HEMOGLOBIN
HGB: 9 g/dL — ABNORMAL LOW (ref 13.0–18.0)
HGB: 9.5 g/dL — ABNORMAL LOW (ref 13.0–18.0)

## 2012-07-26 LAB — PROTIME-INR: INR: 2.4

## 2012-07-27 LAB — BASIC METABOLIC PANEL WITH GFR
Anion Gap: 6 — ABNORMAL LOW (ref 7–16)
BUN: 7 mg/dL (ref 7–18)
Calcium, Total: 8 mg/dL — ABNORMAL LOW (ref 8.5–10.1)
Chloride: 94 mmol/L — ABNORMAL LOW (ref 98–107)
Co2: 28 mmol/L (ref 21–32)
Creatinine: 0.55 mg/dL — ABNORMAL LOW (ref 0.60–1.30)
EGFR (African American): >60
EGFR (Non-African Amer.): >60
Glucose: 106 mg/dL — ABNORMAL HIGH (ref 65–99)
Osmolality: 255 (ref 275–301)
Potassium: 3.9 mmol/L (ref 3.5–5.1)
Sodium: 128 mmol/L — ABNORMAL LOW (ref 136–145)

## 2012-07-27 LAB — URINALYSIS, COMPLETE
Glucose,UR: NEGATIVE mg/dL (ref 0–75)
Ketone: NEGATIVE
Leukocyte Esterase: NEGATIVE
Nitrite: NEGATIVE
Protein: NEGATIVE
RBC,UR: 1 /HPF (ref 0–5)
Specific Gravity: 1.006 (ref 1.003–1.030)
WBC UR: 1 /HPF (ref 0–5)

## 2012-07-27 LAB — CBC WITH DIFFERENTIAL/PLATELET
Basophil #: 0 10*3/uL (ref 0.0–0.1)
Basophil %: 0.3 %
Lymphocyte #: 0.8 10*3/uL — ABNORMAL LOW (ref 1.0–3.6)
Lymphocyte %: 12.7 %
MCH: 31.7 pg (ref 26.0–34.0)
MCHC: 35.1 g/dL (ref 32.0–36.0)
MCV: 90 fL (ref 80–100)
Monocyte #: 0.7 x10 3/mm (ref 0.2–1.0)
Monocyte %: 12.6 %
Neutrophil #: 4.2 10*3/uL (ref 1.4–6.5)
Platelet: 305 10*3/uL (ref 150–440)
RDW: 13.8 % (ref 11.5–14.5)
WBC: 5.9 10*3/uL (ref 3.8–10.6)

## 2012-07-27 LAB — PROTIME-INR
INR: 2.8
Prothrombin Time: 30 secs — ABNORMAL HIGH (ref 11.5–14.7)

## 2012-07-28 ENCOUNTER — Encounter: Payer: Self-pay | Admitting: Internal Medicine

## 2012-07-28 LAB — CBC WITH DIFFERENTIAL/PLATELET
Basophil #: 0 10*3/uL (ref 0.0–0.1)
Eosinophil #: 0.3 10*3/uL (ref 0.0–0.7)
HCT: 25.3 % — ABNORMAL LOW (ref 40.0–52.0)
HGB: 9 g/dL — ABNORMAL LOW (ref 13.0–18.0)
Lymphocyte #: 1.1 10*3/uL (ref 1.0–3.6)
MCH: 31.9 pg (ref 26.0–34.0)
MCHC: 35.5 g/dL (ref 32.0–36.0)
MCV: 90 fL (ref 80–100)
Monocyte #: 0.8 x10 3/mm (ref 0.2–1.0)
Neutrophil #: 4.2 10*3/uL (ref 1.4–6.5)
Platelet: 331 10*3/uL (ref 150–440)
RBC: 2.81 10*6/uL — ABNORMAL LOW (ref 4.40–5.90)
RDW: 14 % (ref 11.5–14.5)
WBC: 6.4 10*3/uL (ref 3.8–10.6)

## 2012-07-28 LAB — BASIC METABOLIC PANEL
Anion Gap: 8 (ref 7–16)
BUN: 10 mg/dL (ref 7–18)
Creatinine: 0.64 mg/dL (ref 0.60–1.30)
EGFR (African American): >60
EGFR (Non-African Amer.): >60
Glucose: 95 mg/dL (ref 65–99)
Potassium: 4 mmol/L (ref 3.5–5.1)
Sodium: 129 mmol/L — ABNORMAL LOW (ref 136–145)

## 2012-07-28 LAB — PATHOLOGY REPORT

## 2012-07-28 LAB — PROTIME-INR: Prothrombin Time: 33.5 secs — ABNORMAL HIGH (ref 11.5–14.7)

## 2012-08-04 LAB — CBC WITH DIFFERENTIAL/PLATELET
Basophil #: 0.1 10*3/uL (ref 0.0–0.1)
Basophil %: 0.7 %
Eosinophil #: 0.4 10*3/uL (ref 0.0–0.7)
Eosinophil %: 4.9 %
HCT: 32.2 % — ABNORMAL LOW (ref 40.0–52.0)
HGB: 11.2 g/dL — ABNORMAL LOW (ref 13.0–18.0)
Lymphocyte #: 1.1 10*3/uL (ref 1.0–3.6)
Lymphocyte %: 13.3 %
MCH: 31.4 pg (ref 26.0–34.0)
MCHC: 34.9 g/dL (ref 32.0–36.0)
Monocyte #: 0.7 x10 3/mm (ref 0.2–1.0)
Neutrophil %: 73.3 %
RBC: 3.58 10*6/uL — ABNORMAL LOW (ref 4.40–5.90)
RDW: 14.5 % (ref 11.5–14.5)

## 2012-08-04 LAB — PROTIME-INR: INR: 2.9

## 2012-08-11 LAB — PROTIME-INR
INR: 3
Prothrombin Time: 31.3 secs — ABNORMAL HIGH (ref 11.5–14.7)

## 2012-09-09 ENCOUNTER — Telehealth: Payer: Self-pay

## 2012-09-09 NOTE — Telephone Encounter (Signed)
Pt states he fell off a ladder in August and broke his hip.  He was in [facility] x 1 week, then went to rehab at [facility] for a few weeks after hospital d/c.  Pt states [facility] was monitoring Coumadin while pt was there, and since d/c he saw his primary MD Dr Dan Humphreys last week and they checked his INR and "it looked good" per pt report.  Pt made f/u appt in Coumadin clinic for nest week 09/16/12 at 11:20am.

## 2012-09-16 ENCOUNTER — Ambulatory Visit (INDEPENDENT_AMBULATORY_CARE_PROVIDER_SITE_OTHER): Payer: Medicare Other

## 2012-09-16 DIAGNOSIS — Z952 Presence of prosthetic heart valve: Secondary | ICD-10-CM

## 2012-09-16 DIAGNOSIS — Z7901 Long term (current) use of anticoagulants: Secondary | ICD-10-CM

## 2012-09-16 DIAGNOSIS — I350 Nonrheumatic aortic (valve) stenosis: Secondary | ICD-10-CM

## 2012-09-16 DIAGNOSIS — I359 Nonrheumatic aortic valve disorder, unspecified: Secondary | ICD-10-CM

## 2012-09-16 DIAGNOSIS — Z954 Presence of other heart-valve replacement: Secondary | ICD-10-CM

## 2012-09-16 LAB — POCT INR: INR: 2.5

## 2012-10-14 ENCOUNTER — Ambulatory Visit (INDEPENDENT_AMBULATORY_CARE_PROVIDER_SITE_OTHER): Payer: Medicare Other

## 2012-10-14 DIAGNOSIS — Z954 Presence of other heart-valve replacement: Secondary | ICD-10-CM

## 2012-10-14 DIAGNOSIS — Z952 Presence of prosthetic heart valve: Secondary | ICD-10-CM

## 2012-10-14 DIAGNOSIS — I350 Nonrheumatic aortic (valve) stenosis: Secondary | ICD-10-CM

## 2012-10-14 DIAGNOSIS — Z7901 Long term (current) use of anticoagulants: Secondary | ICD-10-CM

## 2012-10-14 DIAGNOSIS — I359 Nonrheumatic aortic valve disorder, unspecified: Secondary | ICD-10-CM

## 2012-11-05 ENCOUNTER — Ambulatory Visit (INDEPENDENT_AMBULATORY_CARE_PROVIDER_SITE_OTHER): Payer: Medicare Other | Admitting: Cardiology

## 2012-11-05 ENCOUNTER — Ambulatory Visit (INDEPENDENT_AMBULATORY_CARE_PROVIDER_SITE_OTHER): Payer: Medicare Other | Admitting: *Deleted

## 2012-11-05 ENCOUNTER — Encounter: Payer: Self-pay | Admitting: Cardiology

## 2012-11-05 VITALS — BP 122/68 | HR 76 | Ht 71.0 in | Wt 183.0 lb

## 2012-11-05 DIAGNOSIS — I350 Nonrheumatic aortic (valve) stenosis: Secondary | ICD-10-CM

## 2012-11-05 DIAGNOSIS — F329 Major depressive disorder, single episode, unspecified: Secondary | ICD-10-CM

## 2012-11-05 DIAGNOSIS — Z954 Presence of other heart-valve replacement: Secondary | ICD-10-CM

## 2012-11-05 DIAGNOSIS — Z952 Presence of prosthetic heart valve: Secondary | ICD-10-CM

## 2012-11-05 DIAGNOSIS — Z7901 Long term (current) use of anticoagulants: Secondary | ICD-10-CM

## 2012-11-05 DIAGNOSIS — I359 Nonrheumatic aortic valve disorder, unspecified: Secondary | ICD-10-CM

## 2012-11-05 DIAGNOSIS — I10 Essential (primary) hypertension: Secondary | ICD-10-CM

## 2012-11-05 LAB — POCT INR: INR: 1.5

## 2012-11-05 NOTE — Assessment & Plan Note (Signed)
Coumadin is continued for his mechanical aVR. No change in therapy.

## 2012-11-05 NOTE — Patient Instructions (Addendum)
Your physician wants you to follow-up in: 1 year. You will receive a reminder letter in the mail two months in advance. If you don't receive a letter, please call our office to schedule the follow-up appointment.  

## 2012-11-05 NOTE — Assessment & Plan Note (Signed)
Aortic valve replacement continues to do well. No change in therapy. We'll consider an echo next year.

## 2012-11-05 NOTE — Progress Notes (Signed)
HPI  Should the patient is seen in followup aortic valve replacement and mild coronary disease. He received a mechanical valve to the aortic position in 2002. He was working very well by echo in 2011. He has mild coronary disease. He had a period of time where he had shortness of breath in October, 2011. After complete evaluation it turned out that it was a problem with anxiety. He's been treated for this and he is much better. He says he has some decreased energy but overall he is feeling well.  No Known Allergies  Current Outpatient Prescriptions  Medication Sig Dispense Refill  . ALPRAZolam (XANAX) 0.5 MG tablet Take 0.125 mg by mouth as needed.        Marland Kitchen amLODipine (NORVASC) 10 MG tablet Take 1 tablet (10 mg total) by mouth daily.  90 tablet  3  . aspirin 81 MG tablet Take 81 mg by mouth daily.        . celecoxib (CELEBREX) 100 MG capsule Take 100 mg by mouth as needed.        . fexofenadine (ALLEGRA) 180 MG tablet Take 180 mg by mouth as needed. For seasonal allergies       . finasteride (PROSCAR) 5 MG tablet Take 5 mg by mouth daily.        Marland Kitchen FLUoxetine (PROZAC) 20 MG capsule Take 30 mg by mouth daily.       . Multiple Vitamin (MULTIVITAMIN) tablet Take 1 tablet by mouth daily.        Marland Kitchen rOPINIRole (REQUIP) 0.25 MG tablet Take 0.25 mg by mouth at bedtime.      . Tamsulosin HCl (FLOMAX) 0.4 MG CAPS Take 0.4 mg by mouth daily.        . temazepam (RESTORIL) 15 MG capsule Take 15 mg by mouth at bedtime as needed.      . valsartan (DIOVAN) 320 MG tablet Take 320 mg by mouth daily.        Marland Kitchen warfarin (COUMADIN) 5 MG tablet Take as directed by Anticoagulation clinic  165 tablet  1    History   Social History  . Marital Status: Married    Spouse Name: N/A    Number of Children: N/A  . Years of Education: N/A   Occupational History  . Retired    Social History Main Topics  . Smoking status: Former Games developer  . Smokeless tobacco: Not on file  . Alcohol Use: Yes     Comment: Social  .  Drug Use: No  . Sexually Active:    Other Topics Concern  . Not on file   Social History Narrative   No regular exercise.    No family history on file.  Past Medical History  Diagnosis Date  . GERD (gastroesophageal reflux disease)   . Bradycardia     chronic, no symptoms 07/2010  . Depression     and Anxiety,  08/2010  . Anxiety 10/11  . SOB (shortness of breath) 10/11    08/2010,Episodes at 5 AM, eventually felt to be anxiety, after complete workup including catheterization, pt greatly improved with anxiety meds 11/11  . Hypertension     BP higher than usual 04/19/10; amlodipine increased by telephone  . Decreased hearing     Right ear  . Coronary artery disease     mild, cath, 08/2010  . Aortic stenosis     AVR, 2002, Mechanical  . S/P AVR     St. Jude. mechanical 2002, echo 08/2010  working well  . Warfarin anticoagulation     mechanical AVR  . Ejection fraction     EF 60%,  echo. 08/2010  . Carotid bruit     dopplers in past, no abnormalities    Past Surgical History  Procedure Date  . Valve replacement 1/02    Aortic; echo 3/09 valve working well; echo 10/11 working well; put on Coumadin  . Cardiac catheterization     Patient Active Problem List  Diagnosis  . GERD (gastroesophageal reflux disease)  . Bradycardia  . Depression  . Anxiety  . SOB (shortness of breath)  . Hypertension  . Decreased hearing  . Coronary artery disease  . Aortic stenosis  . S/P AVR  . Warfarin anticoagulation  . Ejection fraction  . Carotid bruit    ROS    Patient denies fever, chills, headache, sweats, rash, change in vision, change in hearing, chest pain, cough, nausea vomiting, urinary symptoms. All other systems are reviewed and are negative.  PHYSICAL EXAM   Patient is oriented to person time and place. Affect is normal. There is no jugulovenous distention. Lungs are clear. Respiratory effort is nonlabored. Cardiac exam reveals S1 and S2. There is a crisp closure  sound of the aortic prosthesis. Abdomen is soft. There is no peripheral edema.  Filed Vitals:   11/05/12 1541  BP: 122/68  Pulse: 76  Height: 5\' 11"  (1.803 m)  Weight: 183 lb (83.008 kg)  SpO2: 98%   EKG is done today and reviewed by me. There is normal sinus rhythm. There is no change.  ASSESSMENT & PLAN

## 2012-11-05 NOTE — Assessment & Plan Note (Signed)
Patient had some depression and anxiety. He is on medications. He wonders if these can be adjusted with his fatigue. I urged him to talk with his primary physician about this.

## 2012-11-05 NOTE — Assessment & Plan Note (Signed)
Blood pressure stable. No change in therapy. 

## 2012-11-16 ENCOUNTER — Ambulatory Visit (INDEPENDENT_AMBULATORY_CARE_PROVIDER_SITE_OTHER): Payer: Medicare Other

## 2012-11-16 DIAGNOSIS — Z952 Presence of prosthetic heart valve: Secondary | ICD-10-CM

## 2012-11-16 DIAGNOSIS — I350 Nonrheumatic aortic (valve) stenosis: Secondary | ICD-10-CM

## 2012-11-16 DIAGNOSIS — Z7901 Long term (current) use of anticoagulants: Secondary | ICD-10-CM

## 2012-11-16 DIAGNOSIS — I359 Nonrheumatic aortic valve disorder, unspecified: Secondary | ICD-10-CM

## 2012-11-16 DIAGNOSIS — Z954 Presence of other heart-valve replacement: Secondary | ICD-10-CM

## 2012-11-16 LAB — POCT INR: INR: 1.6

## 2012-12-02 ENCOUNTER — Ambulatory Visit (INDEPENDENT_AMBULATORY_CARE_PROVIDER_SITE_OTHER): Payer: Medicare Other

## 2012-12-02 DIAGNOSIS — Z7901 Long term (current) use of anticoagulants: Secondary | ICD-10-CM

## 2012-12-02 DIAGNOSIS — Z954 Presence of other heart-valve replacement: Secondary | ICD-10-CM

## 2012-12-02 DIAGNOSIS — I359 Nonrheumatic aortic valve disorder, unspecified: Secondary | ICD-10-CM

## 2012-12-02 DIAGNOSIS — Z952 Presence of prosthetic heart valve: Secondary | ICD-10-CM

## 2012-12-02 DIAGNOSIS — I350 Nonrheumatic aortic (valve) stenosis: Secondary | ICD-10-CM

## 2012-12-08 ENCOUNTER — Telehealth: Payer: Self-pay

## 2012-12-08 MED ORDER — AMOXICILLIN 500 MG PO CAPS: ORAL_CAPSULE | ORAL | Status: DC

## 2012-12-08 NOTE — Telephone Encounter (Signed)
Spoke with pt, aware he does need antibiotics prior to dental work. Script sent to pharm.

## 2012-12-08 NOTE — Telephone Encounter (Signed)
Patient is having a dental cleaning in February. Would like to know if needs to take amoxicillin prior or does this not matter anymore. Please advise.

## 2012-12-08 NOTE — Telephone Encounter (Signed)
Left message for pt to call.

## 2012-12-08 NOTE — Telephone Encounter (Signed)
Dr. Myrtis Ser pt

## 2012-12-30 ENCOUNTER — Ambulatory Visit (INDEPENDENT_AMBULATORY_CARE_PROVIDER_SITE_OTHER): Payer: Medicare Other

## 2012-12-30 DIAGNOSIS — Z7901 Long term (current) use of anticoagulants: Secondary | ICD-10-CM

## 2012-12-30 DIAGNOSIS — Z952 Presence of prosthetic heart valve: Secondary | ICD-10-CM

## 2012-12-30 DIAGNOSIS — I359 Nonrheumatic aortic valve disorder, unspecified: Secondary | ICD-10-CM

## 2012-12-30 DIAGNOSIS — I350 Nonrheumatic aortic (valve) stenosis: Secondary | ICD-10-CM

## 2012-12-30 DIAGNOSIS — Z954 Presence of other heart-valve replacement: Secondary | ICD-10-CM

## 2013-01-13 ENCOUNTER — Ambulatory Visit (INDEPENDENT_AMBULATORY_CARE_PROVIDER_SITE_OTHER): Payer: Medicare Other

## 2013-01-13 DIAGNOSIS — I350 Nonrheumatic aortic (valve) stenosis: Secondary | ICD-10-CM

## 2013-01-13 DIAGNOSIS — Z954 Presence of other heart-valve replacement: Secondary | ICD-10-CM

## 2013-01-13 DIAGNOSIS — I359 Nonrheumatic aortic valve disorder, unspecified: Secondary | ICD-10-CM

## 2013-01-13 DIAGNOSIS — Z7901 Long term (current) use of anticoagulants: Secondary | ICD-10-CM

## 2013-01-13 DIAGNOSIS — Z952 Presence of prosthetic heart valve: Secondary | ICD-10-CM

## 2013-01-13 LAB — POCT INR: INR: 1.5

## 2013-01-18 ENCOUNTER — Ambulatory Visit (INDEPENDENT_AMBULATORY_CARE_PROVIDER_SITE_OTHER): Payer: Medicare Other | Admitting: Pharmacist

## 2013-01-18 DIAGNOSIS — Z954 Presence of other heart-valve replacement: Secondary | ICD-10-CM

## 2013-01-18 LAB — POCT INR: INR: 1.6

## 2013-02-03 ENCOUNTER — Ambulatory Visit (INDEPENDENT_AMBULATORY_CARE_PROVIDER_SITE_OTHER): Payer: Medicare Other

## 2013-02-03 DIAGNOSIS — Z7901 Long term (current) use of anticoagulants: Secondary | ICD-10-CM

## 2013-02-03 DIAGNOSIS — Z952 Presence of prosthetic heart valve: Secondary | ICD-10-CM

## 2013-02-03 DIAGNOSIS — I350 Nonrheumatic aortic (valve) stenosis: Secondary | ICD-10-CM

## 2013-02-03 DIAGNOSIS — I359 Nonrheumatic aortic valve disorder, unspecified: Secondary | ICD-10-CM

## 2013-02-03 DIAGNOSIS — Z954 Presence of other heart-valve replacement: Secondary | ICD-10-CM

## 2013-02-03 MED ORDER — WARFARIN SODIUM 5 MG PO TABS: ORAL_TABLET | ORAL | Status: DC

## 2013-02-17 ENCOUNTER — Ambulatory Visit (INDEPENDENT_AMBULATORY_CARE_PROVIDER_SITE_OTHER): Payer: Medicare Other

## 2013-02-17 DIAGNOSIS — Z952 Presence of prosthetic heart valve: Secondary | ICD-10-CM

## 2013-02-17 DIAGNOSIS — Z954 Presence of other heart-valve replacement: Secondary | ICD-10-CM

## 2013-02-17 DIAGNOSIS — Z7901 Long term (current) use of anticoagulants: Secondary | ICD-10-CM

## 2013-02-17 DIAGNOSIS — I359 Nonrheumatic aortic valve disorder, unspecified: Secondary | ICD-10-CM

## 2013-02-17 DIAGNOSIS — I350 Nonrheumatic aortic (valve) stenosis: Secondary | ICD-10-CM

## 2013-03-10 ENCOUNTER — Ambulatory Visit (INDEPENDENT_AMBULATORY_CARE_PROVIDER_SITE_OTHER): Payer: Medicare Other

## 2013-03-10 DIAGNOSIS — Z954 Presence of other heart-valve replacement: Secondary | ICD-10-CM

## 2013-03-10 DIAGNOSIS — Z7901 Long term (current) use of anticoagulants: Secondary | ICD-10-CM

## 2013-03-10 DIAGNOSIS — I350 Nonrheumatic aortic (valve) stenosis: Secondary | ICD-10-CM

## 2013-03-10 DIAGNOSIS — Z952 Presence of prosthetic heart valve: Secondary | ICD-10-CM

## 2013-03-10 DIAGNOSIS — I359 Nonrheumatic aortic valve disorder, unspecified: Secondary | ICD-10-CM

## 2013-03-10 LAB — POCT INR: INR: 2.2

## 2013-03-23 ENCOUNTER — Ambulatory Visit: Payer: Self-pay | Admitting: Orthopedic Surgery

## 2013-04-05 ENCOUNTER — Encounter: Payer: Self-pay | Admitting: Sports Medicine

## 2013-04-05 ENCOUNTER — Other Ambulatory Visit: Payer: Medicare Other | Admitting: Sports Medicine

## 2013-04-05 ENCOUNTER — Ambulatory Visit (INDEPENDENT_AMBULATORY_CARE_PROVIDER_SITE_OTHER): Payer: Medicare Other | Admitting: Sports Medicine

## 2013-04-05 VITALS — BP 155/74 | HR 79 | Ht 71.0 in | Wt 183.0 lb

## 2013-04-05 DIAGNOSIS — M25561 Pain in right knee: Secondary | ICD-10-CM

## 2013-04-05 DIAGNOSIS — M25569 Pain in unspecified knee: Secondary | ICD-10-CM

## 2013-04-05 DIAGNOSIS — M1711 Unilateral primary osteoarthritis, right knee: Secondary | ICD-10-CM

## 2013-04-05 DIAGNOSIS — M1710 Unilateral primary osteoarthritis, unspecified knee: Secondary | ICD-10-CM

## 2013-04-05 MED ORDER — METHYLPREDNISOLONE ACETATE 40 MG/ML IJ SUSP
40.0000 mg | Freq: Once | INTRAMUSCULAR | Status: AC
  Administered 2013-04-05: 40 mg via INTRA_ARTICULAR

## 2013-04-06 NOTE — Progress Notes (Signed)
Subjective:    Patient ID: [name], male    DOB: 04/03/1931, 79 y.o.   MRN: [id number]  HPI chief complaint: Right knee pain  Very pleasant 79 year old male comes in today at the request of Dr. Kennedy Bucker for evaluation and possible aspiration an injection of a Baker's cyst in his right knee. Patient states that on or around April 27 he began to experience some pain in the posterior aspect of his right knee. This was followed by some swelling which extended into the proximal calf. He was seen at the [facility] clinic and an ultrasound was ordered. That ultrasound showed a 7.8 cm Baker's cyst no evidence of DVT. He was then referred to our office. Since that time, his pain and swelling have improved tremendously. He is now left with some diffuse discomfort in the knee which is familiar to him. He tells me that he has a history of bone-on-bone osteoarthritis in this knee but has not had any cortisone injections recently in the knee. He is status post right hip hemiarthroplasty in August of 2013 and his main complaint today is some lateral hip pain which Dr. Rosita Kea is managing. In fact, it sounds like he got a cortisone injection into his greater trochanteric bursa during a recent office visit with him and he is scheduled to return to his office next week for possible injection into the right knee. Patient denies any recent trauma. No fevers or chills. No mechanical symptom. No prior surgeries on the knee. Again, the pain and swelling he was experiencing in the posterior knee has improved dramatically.  Surgical history is significant for the aforementioned hemiarthroplasty. He has also had a mechanical aortic valve installed Medications are reviewed. List is extensive and includes Coumadin, Norvasc, Celebrex, Diovan, and Requip No known drug allergies Is a former smoker, he drinks one beer per day, and is retired    Review of Systems     Objective:   Physical Exam Well-developed,  well-nourished. No acute distress. Awake alert and oriented x3.  Right knee: Patient has about a 5 extension lag with flexion to 135. No effusion. There is no palpable Baker's cyst either in the popliteal fossa or in the proximal calf. There is tenderness to palpation along the medial joint line but a negative McMurrays. Mild tenderness along the lateral joint line as well. Knee is grossly stable to ligamentous exam.  Right hip: Tenderness to palpation over the area of the greater trochanteric bursa. No pain with log roll.  Patient walks with an antalgic gait due to his hip pain       Assessment & Plan:  1. Right knee pain secondary to DJD with resolved Baker's cyst 2. Status post right hip hemiarthroplasty in August of 2013  Patient's Baker's cyst has currently resolved to the point that do not feel that aspiration an injection is necessary. I did offer the patient an intra-articular injection today and he agrees. Patient's knee was injected using an anterior medial approach after risks and benefits were explained. Tolerated the procedure without difficulty. If his symptoms persist he will followup with Dr. Rosita Kea next week as scheduled. We will also need to followup with Dr. Rosita Kea for his ongoing right hip issues.  Consent obtained and verified. Time-out conducted. Noted no overlying erythema, induration, or other signs of local infection. Skin prepped in a sterile fashion. Topical analgesic spray: Ethyl chloride. Joint: right knee Needle: 25g 1 1/2 inch  Completed without difficulty. Meds: 3cc 1% xylocaine, 1cc (40mg )  depomedrol  Advised to call if fevers/chills, erythema, induration, drainage, or persistent bleeding.

## 2013-04-07 ENCOUNTER — Ambulatory Visit (INDEPENDENT_AMBULATORY_CARE_PROVIDER_SITE_OTHER): Payer: Medicare Other

## 2013-04-07 DIAGNOSIS — Z954 Presence of other heart-valve replacement: Secondary | ICD-10-CM

## 2013-04-07 DIAGNOSIS — Z7901 Long term (current) use of anticoagulants: Secondary | ICD-10-CM

## 2013-04-07 DIAGNOSIS — I350 Nonrheumatic aortic (valve) stenosis: Secondary | ICD-10-CM

## 2013-04-07 DIAGNOSIS — Z952 Presence of prosthetic heart valve: Secondary | ICD-10-CM

## 2013-04-07 DIAGNOSIS — I359 Nonrheumatic aortic valve disorder, unspecified: Secondary | ICD-10-CM

## 2013-04-07 LAB — POCT INR: INR: 3.1

## 2013-04-23 ENCOUNTER — Other Ambulatory Visit: Payer: Self-pay

## 2013-04-23 MED ORDER — AMLODIPINE BESYLATE 10 MG PO TABS: 10.0000 mg | ORAL_TABLET | Freq: Every day | ORAL | Status: DC

## 2013-04-23 NOTE — Telephone Encounter (Signed)
amLODipine (NORVASC) 10 MG tablet  Take 1 tablet (10 mg total) by mouth daily.   90 tablet   3   Patient Instructions  Your physician wants you to follow-up in: 1 year.   You will receive a reminder letter in the mail two months in advance. If you don't receive a letter, please call our office to schedule the follow-up appointment. Patient Instructions History Recorded  Previous Visit  Provider Department Encounter #  11/04/2012  3:15 PM Willa Rough, MD [facility] [id number]

## 2013-05-05 ENCOUNTER — Ambulatory Visit (INDEPENDENT_AMBULATORY_CARE_PROVIDER_SITE_OTHER): Payer: Medicare Other

## 2013-05-05 DIAGNOSIS — I359 Nonrheumatic aortic valve disorder, unspecified: Secondary | ICD-10-CM

## 2013-05-05 DIAGNOSIS — Z952 Presence of prosthetic heart valve: Secondary | ICD-10-CM

## 2013-05-05 DIAGNOSIS — Z954 Presence of other heart-valve replacement: Secondary | ICD-10-CM

## 2013-05-05 DIAGNOSIS — Z7901 Long term (current) use of anticoagulants: Secondary | ICD-10-CM

## 2013-05-05 DIAGNOSIS — I350 Nonrheumatic aortic (valve) stenosis: Secondary | ICD-10-CM

## 2013-06-09 ENCOUNTER — Ambulatory Visit (INDEPENDENT_AMBULATORY_CARE_PROVIDER_SITE_OTHER): Payer: Medicare Other | Admitting: *Deleted

## 2013-06-09 DIAGNOSIS — Z7901 Long term (current) use of anticoagulants: Secondary | ICD-10-CM

## 2013-06-09 DIAGNOSIS — I350 Nonrheumatic aortic (valve) stenosis: Secondary | ICD-10-CM

## 2013-06-09 DIAGNOSIS — I359 Nonrheumatic aortic valve disorder, unspecified: Secondary | ICD-10-CM

## 2013-06-09 DIAGNOSIS — Z954 Presence of other heart-valve replacement: Secondary | ICD-10-CM

## 2013-06-09 DIAGNOSIS — Z952 Presence of prosthetic heart valve: Secondary | ICD-10-CM

## 2013-07-14 ENCOUNTER — Ambulatory Visit (INDEPENDENT_AMBULATORY_CARE_PROVIDER_SITE_OTHER): Payer: Medicare Other | Admitting: *Deleted

## 2013-07-14 DIAGNOSIS — I350 Nonrheumatic aortic (valve) stenosis: Secondary | ICD-10-CM

## 2013-07-14 DIAGNOSIS — Z954 Presence of other heart-valve replacement: Secondary | ICD-10-CM

## 2013-07-14 DIAGNOSIS — I359 Nonrheumatic aortic valve disorder, unspecified: Secondary | ICD-10-CM

## 2013-07-14 DIAGNOSIS — Z7901 Long term (current) use of anticoagulants: Secondary | ICD-10-CM

## 2013-07-14 DIAGNOSIS — Z952 Presence of prosthetic heart valve: Secondary | ICD-10-CM

## 2013-07-14 LAB — POCT INR: INR: 2.1

## 2013-08-25 ENCOUNTER — Ambulatory Visit (INDEPENDENT_AMBULATORY_CARE_PROVIDER_SITE_OTHER): Payer: Medicare Other | Admitting: General Practice

## 2013-08-25 DIAGNOSIS — Z7901 Long term (current) use of anticoagulants: Secondary | ICD-10-CM

## 2013-08-25 DIAGNOSIS — I359 Nonrheumatic aortic valve disorder, unspecified: Secondary | ICD-10-CM

## 2013-08-25 DIAGNOSIS — I350 Nonrheumatic aortic (valve) stenosis: Secondary | ICD-10-CM

## 2013-08-25 DIAGNOSIS — Z952 Presence of prosthetic heart valve: Secondary | ICD-10-CM

## 2013-08-25 DIAGNOSIS — Z954 Presence of other heart-valve replacement: Secondary | ICD-10-CM

## 2013-10-06 ENCOUNTER — Ambulatory Visit (INDEPENDENT_AMBULATORY_CARE_PROVIDER_SITE_OTHER): Payer: Medicare Other | Admitting: *Deleted

## 2013-10-06 DIAGNOSIS — Z954 Presence of other heart-valve replacement: Secondary | ICD-10-CM

## 2013-10-06 DIAGNOSIS — Z952 Presence of prosthetic heart valve: Secondary | ICD-10-CM

## 2013-10-06 DIAGNOSIS — I350 Nonrheumatic aortic (valve) stenosis: Secondary | ICD-10-CM

## 2013-10-06 DIAGNOSIS — Z7901 Long term (current) use of anticoagulants: Secondary | ICD-10-CM

## 2013-10-06 DIAGNOSIS — I359 Nonrheumatic aortic valve disorder, unspecified: Secondary | ICD-10-CM

## 2013-10-06 MED ORDER — WARFARIN SODIUM 5 MG PO TABS: ORAL_TABLET | ORAL | Status: DC

## 2013-10-15 ENCOUNTER — Other Ambulatory Visit: Payer: Self-pay | Admitting: Cardiology

## 2013-11-05 ENCOUNTER — Encounter: Payer: Self-pay | Admitting: Cardiology

## 2013-11-05 ENCOUNTER — Ambulatory Visit (INDEPENDENT_AMBULATORY_CARE_PROVIDER_SITE_OTHER): Payer: Medicare Other | Admitting: *Deleted

## 2013-11-05 ENCOUNTER — Ambulatory Visit (INDEPENDENT_AMBULATORY_CARE_PROVIDER_SITE_OTHER): Payer: Medicare Other | Admitting: Cardiology

## 2013-11-05 VITALS — BP 138/70 | HR 79 | Ht 71.0 in | Wt 196.0 lb

## 2013-11-05 DIAGNOSIS — Z954 Presence of other heart-valve replacement: Secondary | ICD-10-CM

## 2013-11-05 DIAGNOSIS — Z7901 Long term (current) use of anticoagulants: Secondary | ICD-10-CM

## 2013-11-05 DIAGNOSIS — Z952 Presence of prosthetic heart valve: Secondary | ICD-10-CM

## 2013-11-05 DIAGNOSIS — I498 Other specified cardiac arrhythmias: Secondary | ICD-10-CM

## 2013-11-05 DIAGNOSIS — I350 Nonrheumatic aortic (valve) stenosis: Secondary | ICD-10-CM

## 2013-11-05 DIAGNOSIS — I359 Nonrheumatic aortic valve disorder, unspecified: Secondary | ICD-10-CM

## 2013-11-05 DIAGNOSIS — R001 Bradycardia, unspecified: Secondary | ICD-10-CM

## 2013-11-05 DIAGNOSIS — I2510 Atherosclerotic heart disease of native coronary artery without angina pectoris: Secondary | ICD-10-CM

## 2013-11-05 NOTE — Assessment & Plan Note (Signed)
He has done very well with his mechanical prosthesis. No further workup.

## 2013-11-05 NOTE — Assessment & Plan Note (Signed)
Coumadin is continued. This is crucial for his valve. No change in therapy.

## 2013-11-05 NOTE — Assessment & Plan Note (Signed)
He is not having any significant problems with bradycardia.

## 2013-11-05 NOTE — Assessment & Plan Note (Signed)
The patient has a mechanical aortic valve replacement. He's doing very well. He does not need a followup echo this year.

## 2013-11-05 NOTE — Patient Instructions (Signed)
Your physician recommends that you continue on your current medications as directed. Please refer to the Current Medication list given to you today.  Your physician wants you to follow-up in: 1 year. You will receive a reminder letter in the mail two months in advance. If you don't receive a letter, please call our office to schedule the follow-up appointment.  

## 2013-11-05 NOTE — Assessment & Plan Note (Signed)
We know that he had minimal coronary disease in the past. Over time he has not been on a statin. I've asked him to check with his primary physician concerning whether a statin should be considered at this time.

## 2013-11-05 NOTE — Progress Notes (Signed)
Patient ID: [name], male   DOB: [birth year], 79 y.o.   MRN: [id number]    HPI  Patient is seen today to followup his aortic valve replacement. He is doing great. He had mild coronary disease in the past. He received a mechanical valve to the aortic position in 2002. His last echo was 2011 and the valve is working well. He had some significant problems with anxiety in October, 2011. This was worked out in these doing well. He's not having chest pain or shortness of breath. There's been no syncope or presyncope.  No Known Allergies  Current Outpatient Prescriptions  Medication Sig Dispense Refill  . ALPRAZolam (XANAX) 0.5 MG tablet Take 0.125 mg by mouth as needed.        Marland Kitchen amLODipine (NORVASC) 10 MG tablet Take 1 tablet by mouth  daily  90 tablet  0  . aspirin 81 MG tablet Take 81 mg by mouth daily.        . celecoxib (CELEBREX) 100 MG capsule Take 100 mg by mouth as needed.        . finasteride (PROSCAR) 5 MG tablet Take 5 mg by mouth daily.        Marland Kitchen FLUoxetine (PROZAC) 20 MG capsule Take 30 mg by mouth daily.       . Multiple Vitamin (MULTIVITAMIN) tablet Take 1 tablet by mouth daily.        Marland Kitchen rOPINIRole (REQUIP) 0.25 MG tablet Take 0.25 mg by mouth at bedtime.      . Tamsulosin HCl (FLOMAX) 0.4 MG CAPS Take 0.4 mg by mouth daily.        . temazepam (RESTORIL) 15 MG capsule Take 15 mg by mouth at bedtime as needed.      . valsartan (DIOVAN) 320 MG tablet Take 320 mg by mouth daily.        Marland Kitchen warfarin (COUMADIN) 5 MG tablet Take as directed by Anticoagulation clinic  180 tablet  1   No current facility-administered medications for this visit.    History   Social History  . Marital Status: Married    Spouse Name: N/A    Number of Children: N/A  . Years of Education: N/A   Occupational History  . Retired    Social History Main Topics  . Smoking status: Former Games developer  . Smokeless tobacco: Not on file  . Alcohol Use: Yes     Comment: Social  . Drug Use: No  . Sexual  Activity:    Other Topics Concern  . Not on file   Social History Narrative   No regular exercise.    No family history on file.  Past Medical History  Diagnosis Date  . GERD (gastroesophageal reflux disease)   . Bradycardia     chronic, no symptoms 07/2010  . Depression     and Anxiety,  08/2010  . Anxiety 10/11  . SOB (shortness of breath) 10/11    08/2010,Episodes at 5 AM, eventually felt to be anxiety, after complete workup including catheterization, pt greatly improved with anxiety meds 11/11  . Hypertension     BP higher than usual 04/19/10; amlodipine increased by telephone  . Decreased hearing     Right ear  . Coronary artery disease     mild, cath, 08/2010  . Aortic stenosis     AVR, 2002, Mechanical  . S/P AVR     St. Jude. mechanical 2002, echo 08/2010 working well  . Warfarin anticoagulation  mechanical AVR  . Ejection fraction     EF 60%,  echo. 08/2010  . Carotid bruit     dopplers in past, no abnormalities    Past Surgical History  Procedure Laterality Date  . Valve replacement  1/02    Aortic; echo 3/09 valve working well; echo 10/11 working well; put on Coumadin  . Cardiac catheterization      Patient Active Problem List   Diagnosis Date Noted  . Bradycardia     Priority: High  . Hypertension     Priority: High  . Coronary artery disease     Priority: High  . S/P AVR     Priority: High  . Warfarin anticoagulation     Priority: High  . Ejection fraction     Priority: High  . GERD (gastroesophageal reflux disease)   . Depression   . Anxiety   . SOB (shortness of breath)   . Decreased hearing   . Aortic stenosis   . Carotid bruit     ROS   Patient denies fever, chills, headache, sweats, rash, change in vision, change in hearing, chest pain, cough, nausea or vomiting, urinary symptoms. All other systems are reviewed and are negative.  PHYSICAL EXAM  Patient is oriented to person time and place. Affect is normal. There is no  jugular venous distention. Lungs are clear. Respiratory effort is nonlabored. Cardiac exam reveals an S1. There is crisp closure of the aortic prosthesis. The abdomen is soft. Is no peripheral edema.  Filed Vitals:   11/05/13 1411  BP: 138/70  Pulse: 79  Height: 5\' 11"  (1.803 m)  Weight: 196 lb (88.905 kg)   EKG is done today and reviewed by me. There is normal sinus rhythm. There are small inferior Q waves. No change from the past.  ASSESSMENT & PLAN

## 2013-12-15 ENCOUNTER — Ambulatory Visit (INDEPENDENT_AMBULATORY_CARE_PROVIDER_SITE_OTHER): Payer: Medicare Other

## 2013-12-15 DIAGNOSIS — Z952 Presence of prosthetic heart valve: Secondary | ICD-10-CM

## 2013-12-15 DIAGNOSIS — Z954 Presence of other heart-valve replacement: Secondary | ICD-10-CM

## 2013-12-15 DIAGNOSIS — Z7901 Long term (current) use of anticoagulants: Secondary | ICD-10-CM

## 2013-12-15 DIAGNOSIS — I359 Nonrheumatic aortic valve disorder, unspecified: Secondary | ICD-10-CM

## 2013-12-15 DIAGNOSIS — I350 Nonrheumatic aortic (valve) stenosis: Secondary | ICD-10-CM

## 2013-12-15 LAB — POCT INR: INR: 2.2

## 2014-01-26 ENCOUNTER — Ambulatory Visit (INDEPENDENT_AMBULATORY_CARE_PROVIDER_SITE_OTHER): Payer: Medicare Other

## 2014-01-26 DIAGNOSIS — Z954 Presence of other heart-valve replacement: Secondary | ICD-10-CM

## 2014-01-26 DIAGNOSIS — I350 Nonrheumatic aortic (valve) stenosis: Secondary | ICD-10-CM

## 2014-01-26 DIAGNOSIS — Z7901 Long term (current) use of anticoagulants: Secondary | ICD-10-CM

## 2014-01-26 DIAGNOSIS — I359 Nonrheumatic aortic valve disorder, unspecified: Secondary | ICD-10-CM

## 2014-01-26 DIAGNOSIS — Z952 Presence of prosthetic heart valve: Secondary | ICD-10-CM

## 2014-01-26 LAB — POCT INR: INR: 2.2

## 2014-02-01 ENCOUNTER — Other Ambulatory Visit: Payer: Self-pay | Admitting: Cardiology

## 2014-03-09 ENCOUNTER — Ambulatory Visit (INDEPENDENT_AMBULATORY_CARE_PROVIDER_SITE_OTHER): Payer: Medicare Other

## 2014-03-09 DIAGNOSIS — Z952 Presence of prosthetic heart valve: Secondary | ICD-10-CM

## 2014-03-09 DIAGNOSIS — Z954 Presence of other heart-valve replacement: Secondary | ICD-10-CM

## 2014-03-09 DIAGNOSIS — Z7901 Long term (current) use of anticoagulants: Secondary | ICD-10-CM

## 2014-03-09 DIAGNOSIS — I350 Nonrheumatic aortic (valve) stenosis: Secondary | ICD-10-CM

## 2014-03-09 DIAGNOSIS — I359 Nonrheumatic aortic valve disorder, unspecified: Secondary | ICD-10-CM

## 2014-03-09 LAB — POCT INR: INR: 2.1

## 2014-03-11 ENCOUNTER — Other Ambulatory Visit: Payer: Self-pay | Admitting: Cardiology

## 2014-04-14 ENCOUNTER — Emergency Department: Payer: Self-pay | Admitting: Emergency Medicine

## 2014-04-14 ENCOUNTER — Encounter: Payer: Self-pay | Admitting: Internal Medicine

## 2014-04-14 LAB — CBC WITH DIFFERENTIAL/PLATELET
BASOS PCT: 0.5 %
Basophil #: 0 10*3/uL (ref 0.0–0.1)
EOS ABS: 0.3 10*3/uL (ref 0.0–0.7)
Eosinophil %: 3.9 %
HCT: 42.9 % (ref 40.0–52.0)
HGB: 14.3 g/dL (ref 13.0–18.0)
Lymphocyte #: 1.2 10*3/uL (ref 1.0–3.6)
Lymphocyte %: 17.3 %
MCH: 30.4 pg (ref 26.0–34.0)
MCHC: 33.4 g/dL (ref 32.0–36.0)
MCV: 91 fL (ref 80–100)
Monocyte #: 0.7 x10 3/mm (ref 0.2–1.0)
Monocyte %: 9.9 %
Neutrophil #: 4.8 10*3/uL (ref 1.4–6.5)
Neutrophil %: 68.4 %
Platelet: 257 10*3/uL (ref 150–440)
RBC: 4.72 10*6/uL (ref 4.40–5.90)
RDW: 14.4 % (ref 11.5–14.5)
WBC: 7 10*3/uL (ref 3.8–10.6)

## 2014-04-14 LAB — URINALYSIS, COMPLETE
Bacteria: NONE SEEN
Bilirubin,UR: NEGATIVE
Blood: NEGATIVE
GLUCOSE, UR: NEGATIVE mg/dL (ref 0–75)
Ketone: NEGATIVE
LEUKOCYTE ESTERASE: NEGATIVE
Nitrite: NEGATIVE
PH: 7 (ref 4.5–8.0)
PROTEIN: NEGATIVE
RBC,UR: <1 /HPF (ref 0–5)
SQUAMOUS EPITHELIAL: NONE SEEN
Specific Gravity: 1.004 (ref 1.003–1.030)
WBC UR: NONE SEEN /HPF (ref 0–5)

## 2014-04-14 LAB — PROTIME-INR
INR: 2
Prothrombin Time: 22.2 secs — ABNORMAL HIGH (ref 11.5–14.7)

## 2014-04-14 LAB — COMPREHENSIVE METABOLIC PANEL
ALT: 22 U/L (ref 12–78)
ANION GAP: 6 — AB (ref 7–16)
Albumin: 4 g/dL (ref 3.4–5.0)
Alkaline Phosphatase: 88 U/L
BUN: 14 mg/dL (ref 7–18)
Bilirubin,Total: 0.9 mg/dL (ref 0.2–1.0)
CHLORIDE: 94 mmol/L — AB (ref 98–107)
CO2: 26 mmol/L (ref 21–32)
CREATININE: 0.82 mg/dL (ref 0.60–1.30)
Calcium, Total: 8.7 mg/dL (ref 8.5–10.1)
Glucose: 104 mg/dL — ABNORMAL HIGH (ref 65–99)
OSMOLALITY: 254 (ref 275–301)
Potassium: 4.1 mmol/L (ref 3.5–5.1)
SGOT(AST): 34 U/L (ref 15–37)
SODIUM: 126 mmol/L — AB (ref 136–145)
Total Protein: 7.1 g/dL (ref 6.4–8.2)

## 2014-04-19 LAB — PROTIME-INR
INR: 3
INR: 3 — AB (ref <=1.1)
Prothrombin Time: 30.3 secs — ABNORMAL HIGH (ref 11.5–14.7)

## 2014-04-25 ENCOUNTER — Encounter: Payer: Self-pay | Admitting: Internal Medicine

## 2014-04-27 ENCOUNTER — Ambulatory Visit (INDEPENDENT_AMBULATORY_CARE_PROVIDER_SITE_OTHER): Payer: Medicare Other | Admitting: Cardiovascular Disease

## 2014-04-27 DIAGNOSIS — Z954 Presence of other heart-valve replacement: Secondary | ICD-10-CM

## 2014-04-27 DIAGNOSIS — I350 Nonrheumatic aortic (valve) stenosis: Secondary | ICD-10-CM

## 2014-04-27 DIAGNOSIS — Z7901 Long term (current) use of anticoagulants: Secondary | ICD-10-CM

## 2014-04-27 DIAGNOSIS — I359 Nonrheumatic aortic valve disorder, unspecified: Secondary | ICD-10-CM

## 2014-04-27 DIAGNOSIS — Z952 Presence of prosthetic heart valve: Secondary | ICD-10-CM

## 2014-05-18 ENCOUNTER — Ambulatory Visit (INDEPENDENT_AMBULATORY_CARE_PROVIDER_SITE_OTHER): Payer: Medicare Other

## 2014-05-18 DIAGNOSIS — Z952 Presence of prosthetic heart valve: Secondary | ICD-10-CM

## 2014-05-18 DIAGNOSIS — Z954 Presence of other heart-valve replacement: Secondary | ICD-10-CM

## 2014-05-18 DIAGNOSIS — I350 Nonrheumatic aortic (valve) stenosis: Secondary | ICD-10-CM

## 2014-05-18 DIAGNOSIS — I359 Nonrheumatic aortic valve disorder, unspecified: Secondary | ICD-10-CM

## 2014-05-18 DIAGNOSIS — Z7901 Long term (current) use of anticoagulants: Secondary | ICD-10-CM

## 2014-05-18 LAB — POCT INR: INR: 3.8

## 2014-06-08 ENCOUNTER — Ambulatory Visit (INDEPENDENT_AMBULATORY_CARE_PROVIDER_SITE_OTHER): Payer: Medicare Other

## 2014-06-08 DIAGNOSIS — Z952 Presence of prosthetic heart valve: Secondary | ICD-10-CM

## 2014-06-08 DIAGNOSIS — Z954 Presence of other heart-valve replacement: Secondary | ICD-10-CM

## 2014-06-08 DIAGNOSIS — I359 Nonrheumatic aortic valve disorder, unspecified: Secondary | ICD-10-CM

## 2014-06-08 DIAGNOSIS — I350 Nonrheumatic aortic (valve) stenosis: Secondary | ICD-10-CM

## 2014-06-08 DIAGNOSIS — Z7901 Long term (current) use of anticoagulants: Secondary | ICD-10-CM

## 2014-06-08 LAB — POCT INR: INR: 3.2

## 2014-06-29 ENCOUNTER — Ambulatory Visit (INDEPENDENT_AMBULATORY_CARE_PROVIDER_SITE_OTHER): Payer: Medicare Other

## 2014-06-29 DIAGNOSIS — I350 Nonrheumatic aortic (valve) stenosis: Secondary | ICD-10-CM

## 2014-06-29 DIAGNOSIS — Z7901 Long term (current) use of anticoagulants: Secondary | ICD-10-CM

## 2014-06-29 DIAGNOSIS — Z954 Presence of other heart-valve replacement: Secondary | ICD-10-CM

## 2014-06-29 DIAGNOSIS — Z952 Presence of prosthetic heart valve: Secondary | ICD-10-CM

## 2014-06-29 DIAGNOSIS — I359 Nonrheumatic aortic valve disorder, unspecified: Secondary | ICD-10-CM

## 2014-06-29 LAB — POCT INR: INR: 2.1

## 2014-07-22 ENCOUNTER — Emergency Department: Payer: Self-pay | Admitting: Student

## 2014-07-22 LAB — URINALYSIS, COMPLETE
BLOOD: NEGATIVE
Bacteria: NONE SEEN
Bilirubin,UR: NEGATIVE
GLUCOSE, UR: NEGATIVE mg/dL (ref 0–75)
LEUKOCYTE ESTERASE: NEGATIVE
Nitrite: NEGATIVE
Ph: 6 (ref 4.5–8.0)
RBC,UR: 3 /HPF (ref 0–5)
SPECIFIC GRAVITY: 1.017 (ref 1.003–1.030)
Squamous Epithelial: <1

## 2014-07-22 LAB — CBC WITH DIFFERENTIAL/PLATELET
BASOS ABS: 0 10*3/uL (ref 0.0–0.1)
Basophil %: 0.4 %
EOS ABS: 0.4 10*3/uL (ref 0.0–0.7)
Eosinophil %: 3.5 %
HCT: 45.8 % (ref 40.0–52.0)
HGB: 14.8 g/dL (ref 13.0–18.0)
LYMPHS PCT: 13.5 %
Lymphocyte #: 1.4 10*3/uL (ref 1.0–3.6)
MCH: 29.4 pg (ref 26.0–34.0)
MCHC: 32.3 g/dL (ref 32.0–36.0)
MCV: 91 fL (ref 80–100)
MONO ABS: 0.8 x10 3/mm (ref 0.2–1.0)
MONOS PCT: 8.2 %
Neutrophil #: 7.6 10*3/uL — ABNORMAL HIGH (ref 1.4–6.5)
Neutrophil %: 74.4 %
PLATELETS: 299 10*3/uL (ref 150–440)
RBC: 5.04 10*6/uL (ref 4.40–5.90)
RDW: 14.3 % (ref 11.5–14.5)
WBC: 10.2 10*3/uL (ref 3.8–10.6)

## 2014-07-22 LAB — COMPREHENSIVE METABOLIC PANEL
ANION GAP: 10 (ref 7–16)
AST: 25 U/L (ref 15–37)
Albumin: 4.1 g/dL (ref 3.4–5.0)
Alkaline Phosphatase: 104 U/L
BILIRUBIN TOTAL: 0.9 mg/dL (ref 0.2–1.0)
BUN: 13 mg/dL (ref 7–18)
CO2: 24 mmol/L (ref 21–32)
Calcium, Total: 8.9 mg/dL (ref 8.5–10.1)
Chloride: 92 mmol/L — ABNORMAL LOW (ref 98–107)
Creatinine: 0.78 mg/dL (ref 0.60–1.30)
EGFR (Non-African Amer.): >60
Glucose: 116 mg/dL — ABNORMAL HIGH (ref 65–99)
Osmolality: 254 (ref 275–301)
Potassium: 4 mmol/L (ref 3.5–5.1)
SGPT (ALT): 22 U/L
SODIUM: 126 mmol/L — AB (ref 136–145)
Total Protein: 7.4 g/dL (ref 6.4–8.2)

## 2014-07-22 LAB — LIPASE, BLOOD: Lipase: 129 U/L (ref 73–393)

## 2014-07-22 LAB — TROPONIN I: Troponin-I: <0.02 ng/mL

## 2014-07-22 LAB — PROTIME-INR
INR: 2.4
Prothrombin Time: 25.9 secs — ABNORMAL HIGH (ref 11.5–14.7)

## 2014-07-22 LAB — APTT: Activated PTT: 49.8 secs — ABNORMAL HIGH (ref 23.6–35.9)

## 2014-07-27 ENCOUNTER — Ambulatory Visit (INDEPENDENT_AMBULATORY_CARE_PROVIDER_SITE_OTHER): Payer: Medicare Other

## 2014-07-27 DIAGNOSIS — I359 Nonrheumatic aortic valve disorder, unspecified: Secondary | ICD-10-CM

## 2014-07-27 DIAGNOSIS — Z7901 Long term (current) use of anticoagulants: Secondary | ICD-10-CM

## 2014-07-27 DIAGNOSIS — Z952 Presence of prosthetic heart valve: Secondary | ICD-10-CM

## 2014-07-27 DIAGNOSIS — I350 Nonrheumatic aortic (valve) stenosis: Secondary | ICD-10-CM

## 2014-07-27 DIAGNOSIS — Z954 Presence of other heart-valve replacement: Secondary | ICD-10-CM

## 2014-07-27 LAB — POCT INR: INR: 2.3

## 2014-07-28 ENCOUNTER — Emergency Department: Payer: Self-pay | Admitting: Emergency Medicine

## 2014-07-28 LAB — CBC WITH DIFFERENTIAL/PLATELET
Basophil #: 0 10*3/uL (ref 0.0–0.1)
Basophil %: 0.4 %
Eosinophil #: 0.1 10*3/uL (ref 0.0–0.7)
Eosinophil %: 1.7 %
HCT: 44.8 % (ref 40.0–52.0)
HGB: 14.8 g/dL (ref 13.0–18.0)
Lymphocyte #: 1.2 10*3/uL (ref 1.0–3.6)
Lymphocyte %: 14.8 %
MCH: 30 pg (ref 26.0–34.0)
MCHC: 33 g/dL (ref 32.0–36.0)
MCV: 91 fL (ref 80–100)
MONOS PCT: 8.8 %
Monocyte #: 0.7 x10 3/mm (ref 0.2–1.0)
Neutrophil #: 5.8 10*3/uL (ref 1.4–6.5)
Neutrophil %: 74.3 %
PLATELETS: 291 10*3/uL (ref 150–440)
RBC: 4.92 10*6/uL (ref 4.40–5.90)
RDW: 14.4 % (ref 11.5–14.5)
WBC: 7.9 10*3/uL (ref 3.8–10.6)

## 2014-07-28 LAB — BASIC METABOLIC PANEL
Anion Gap: 9 (ref 7–16)
BUN: 15 mg/dL (ref 7–18)
CALCIUM: 8.7 mg/dL (ref 8.5–10.1)
Chloride: 95 mmol/L — ABNORMAL LOW (ref 98–107)
Co2: 25 mmol/L (ref 21–32)
Creatinine: 0.88 mg/dL (ref 0.60–1.30)
EGFR (African American): >60
EGFR (Non-African Amer.): >60
Glucose: 106 mg/dL — ABNORMAL HIGH (ref 65–99)
Osmolality: 260 (ref 275–301)
Potassium: 4.2 mmol/L (ref 3.5–5.1)
Sodium: 129 mmol/L — ABNORMAL LOW (ref 136–145)

## 2014-07-28 LAB — HEPATIC FUNCTION PANEL A (ARMC)
ALK PHOS: 98 U/L
ALT: 19 U/L
Albumin: 4.1 g/dL (ref 3.4–5.0)
BILIRUBIN TOTAL: 0.8 mg/dL (ref 0.2–1.0)
Bilirubin, Direct: 0.2 mg/dL (ref 0.00–0.20)
SGOT(AST): 22 U/L (ref 15–37)
Total Protein: 7.2 g/dL (ref 6.4–8.2)

## 2014-07-28 LAB — URINALYSIS, COMPLETE
Bacteria: NONE SEEN
Bilirubin,UR: NEGATIVE
Blood: NEGATIVE
Glucose,UR: NEGATIVE mg/dL (ref 0–75)
Leukocyte Esterase: NEGATIVE
NITRITE: NEGATIVE
PH: 6 (ref 4.5–8.0)
Protein: 30
SPECIFIC GRAVITY: 1.019 (ref 1.003–1.030)
WBC UR: 1 /HPF (ref 0–5)

## 2014-07-28 LAB — LIPASE, BLOOD: Lipase: 116 U/L (ref 73–393)

## 2014-07-28 LAB — PROTIME-INR
INR: 2.4
Prothrombin Time: 25.3 secs — ABNORMAL HIGH (ref 11.5–14.7)

## 2014-07-28 LAB — TROPONIN I

## 2014-08-05 ENCOUNTER — Ambulatory Visit: Payer: Self-pay | Admitting: Physician Assistant

## 2014-08-20 ENCOUNTER — Emergency Department: Payer: Self-pay | Admitting: Student

## 2014-08-20 ENCOUNTER — Ambulatory Visit: Payer: Self-pay | Admitting: Physician Assistant

## 2014-08-20 LAB — PROTIME-INR
INR: 2.1
Prothrombin Time: 22.8 secs — ABNORMAL HIGH (ref 11.5–14.7)

## 2014-08-24 ENCOUNTER — Ambulatory Visit (INDEPENDENT_AMBULATORY_CARE_PROVIDER_SITE_OTHER): Payer: Medicare Other

## 2014-08-24 DIAGNOSIS — Z7901 Long term (current) use of anticoagulants: Secondary | ICD-10-CM

## 2014-08-24 DIAGNOSIS — Z954 Presence of other heart-valve replacement: Secondary | ICD-10-CM

## 2014-08-24 DIAGNOSIS — I359 Nonrheumatic aortic valve disorder, unspecified: Secondary | ICD-10-CM

## 2014-08-24 DIAGNOSIS — I350 Nonrheumatic aortic (valve) stenosis: Secondary | ICD-10-CM

## 2014-08-24 DIAGNOSIS — Z952 Presence of prosthetic heart valve: Secondary | ICD-10-CM

## 2014-08-24 LAB — POCT INR: INR: 2.4

## 2014-09-07 ENCOUNTER — Ambulatory Visit: Payer: Medicare Other | Admitting: Sports Medicine

## 2014-09-28 ENCOUNTER — Ambulatory Visit (INDEPENDENT_AMBULATORY_CARE_PROVIDER_SITE_OTHER): Payer: Medicare Other

## 2014-09-28 DIAGNOSIS — I350 Nonrheumatic aortic (valve) stenosis: Secondary | ICD-10-CM

## 2014-09-28 DIAGNOSIS — Z954 Presence of other heart-valve replacement: Secondary | ICD-10-CM

## 2014-09-28 DIAGNOSIS — Z952 Presence of prosthetic heart valve: Secondary | ICD-10-CM

## 2014-09-28 DIAGNOSIS — I359 Nonrheumatic aortic valve disorder, unspecified: Secondary | ICD-10-CM

## 2014-09-28 DIAGNOSIS — Z7901 Long term (current) use of anticoagulants: Secondary | ICD-10-CM

## 2014-09-28 LAB — POCT INR: INR: 2.2

## 2014-09-30 ENCOUNTER — Encounter: Payer: Self-pay | Admitting: Podiatry

## 2014-09-30 ENCOUNTER — Ambulatory Visit (INDEPENDENT_AMBULATORY_CARE_PROVIDER_SITE_OTHER): Payer: Medicare Other

## 2014-09-30 ENCOUNTER — Ambulatory Visit (INDEPENDENT_AMBULATORY_CARE_PROVIDER_SITE_OTHER): Payer: Medicare Other | Admitting: Podiatry

## 2014-09-30 VITALS — BP 127/56 | HR 81 | Resp 16 | Ht 70.0 in | Wt 195.0 lb

## 2014-09-30 DIAGNOSIS — M779 Enthesopathy, unspecified: Secondary | ICD-10-CM

## 2014-09-30 DIAGNOSIS — M2042 Other hammer toe(s) (acquired), left foot: Secondary | ICD-10-CM

## 2014-09-30 DIAGNOSIS — L84 Corns and callosities: Secondary | ICD-10-CM

## 2014-09-30 NOTE — Progress Notes (Signed)
   Subjective:    Patient ID: [name], male    DOB: 09-19-1931, 80 y.o.   MRN: [id number]  HPI Comments: i have a painful corn on the end of my 3rd toe left foot. My little toe on my rt foot hurts. Lavenia Atlasve been having this pain since the late summer. Its getting worse. Some shoes hurt, and it hurts to walk. i dont do anything for my feet.  Foot Pain      Review of Systems  HENT: Positive for hearing loss and sinus pressure.   All other systems reviewed and are negative.      Objective:   Physical Exam        Assessment & Plan:  i

## 2014-10-02 NOTE — Progress Notes (Signed)
Subjective:     Patient ID: [name] BergerSydnor Arnold Pong, male   DOB: 07-Mar-1931, 80 y.o.   MRN: [id number]  HPIpatient is noted to have significant digital deformity with a painful corn at the end of the third toe left that he cannot cut and is becoming increasingly hard to walk.   Review of Systems     Objective:   Physical Exam Neurovascular status unchanged with significant structural malalignment of the digits left over right with rigid contracture of the lesser digits and keratotic lesion of the distal third toe left upon palpation    Assessment:     Distal keratotic lesion third left secondary to structure    Plan:     Reviewed condition and at this time discussed hammertoe correction and also the nail that is partially loose but should grow out over time and debrided tissue on the third toe left which patient tolerated well

## 2014-10-30 ENCOUNTER — Inpatient Hospital Stay: Payer: Self-pay | Admitting: Family Medicine

## 2014-10-30 LAB — URINALYSIS, COMPLETE
BLOOD: NEGATIVE
Bilirubin,UR: NEGATIVE
Glucose,UR: NEGATIVE mg/dL (ref 0–75)
Ketone: NEGATIVE
Leukocyte Esterase: NEGATIVE
NITRITE: NEGATIVE
PROTEIN: NEGATIVE
Ph: 7 (ref 4.5–8.0)
RBC,UR: 3 /HPF (ref 0–5)
Specific Gravity: 1.014 (ref 1.003–1.030)
Squamous Epithelial: <1
WBC UR: <1 /HPF (ref 0–5)

## 2014-10-30 LAB — APTT
ACTIVATED PTT: 41.7 s — AB (ref 23.6–35.9)
Activated PTT: 54.6 secs — ABNORMAL HIGH (ref 23.6–35.9)

## 2014-10-30 LAB — CBC WITH DIFFERENTIAL/PLATELET
BASOS PCT: 0.5 %
Basophil #: 0 10*3/uL (ref 0.0–0.1)
EOS PCT: 2.7 %
Eosinophil #: 0.2 10*3/uL (ref 0.0–0.7)
HCT: 39.5 % — ABNORMAL LOW (ref 40.0–52.0)
HGB: 12.9 g/dL — AB (ref 13.0–18.0)
LYMPHS ABS: 0.9 10*3/uL — AB (ref 1.0–3.6)
LYMPHS PCT: 11.7 %
MCH: 29.9 pg (ref 26.0–34.0)
MCHC: 32.7 g/dL (ref 32.0–36.0)
MCV: 91 fL (ref 80–100)
Monocyte #: 0.7 x10 3/mm (ref 0.2–1.0)
Monocyte %: 10 %
Neutrophil #: 5.5 10*3/uL (ref 1.4–6.5)
Neutrophil %: 75.1 %
Platelet: 289 10*3/uL (ref 150–440)
RBC: 4.32 10*6/uL — ABNORMAL LOW (ref 4.40–5.90)
RDW: 15.7 % — AB (ref 11.5–14.5)
WBC: 7.4 10*3/uL (ref 3.8–10.6)

## 2014-10-30 LAB — BASIC METABOLIC PANEL
ANION GAP: 11 (ref 7–16)
Anion Gap: 6 — ABNORMAL LOW (ref 7–16)
BUN: 21 mg/dL — ABNORMAL HIGH (ref 7–18)
BUN: 26 mg/dL — AB (ref 7–18)
CALCIUM: 8 mg/dL — AB (ref 8.5–10.1)
CALCIUM: 8.4 mg/dL — AB (ref 8.5–10.1)
CHLORIDE: 101 mmol/L (ref 98–107)
CREATININE: 0.68 mg/dL (ref 0.60–1.30)
Chloride: 98 mmol/L (ref 98–107)
Co2: 24 mmol/L (ref 21–32)
Co2: 28 mmol/L (ref 21–32)
Creatinine: 1.07 mg/dL (ref 0.60–1.30)
EGFR (African American): >60
EGFR (African American): >60
EGFR (Non-African Amer.): >60
EGFR (Non-African Amer.): >60
GLUCOSE: 110 mg/dL — AB (ref 65–99)
Glucose: 102 mg/dL — ABNORMAL HIGH (ref 65–99)
OSMOLALITY: 277 (ref 275–301)
Osmolality: 268 (ref 275–301)
Potassium: 3.9 mmol/L (ref 3.5–5.1)
Potassium: 4.2 mmol/L (ref 3.5–5.1)
Sodium: 132 mmol/L — ABNORMAL LOW (ref 136–145)
Sodium: 136 mmol/L (ref 136–145)

## 2014-10-30 LAB — CBC
HCT: 41.6 % (ref 40.0–52.0)
HGB: 13.3 g/dL (ref 13.0–18.0)
MCH: 29.6 pg (ref 26.0–34.0)
MCHC: 32.1 g/dL (ref 32.0–36.0)
MCV: 92 fL (ref 80–100)
PLATELETS: 320 10*3/uL (ref 150–440)
RBC: 4.5 10*6/uL (ref 4.40–5.90)
RDW: 15.8 % — ABNORMAL HIGH (ref 11.5–14.5)
WBC: 12.9 10*3/uL — AB (ref 3.8–10.6)

## 2014-10-30 LAB — PROTIME-INR
INR: 3
INR: 3.1
PROTHROMBIN TIME: 30.7 s — AB (ref 11.5–14.7)
Prothrombin Time: 29.9 secs — ABNORMAL HIGH (ref 11.5–14.7)

## 2014-10-30 LAB — TROPONIN I: Troponin-I: <0.02 ng/mL

## 2014-10-31 LAB — CBC WITH DIFFERENTIAL/PLATELET
Basophil #: 0 10*3/uL (ref 0.0–0.1)
Basophil %: 0.5 %
Eosinophil #: 0.4 10*3/uL (ref 0.0–0.7)
Eosinophil %: 5.8 %
HCT: 40.2 % (ref 40.0–52.0)
HGB: 13.2 g/dL (ref 13.0–18.0)
Lymphocyte #: 1.2 10*3/uL (ref 1.0–3.6)
Lymphocyte %: 16.1 %
MCH: 30.2 pg (ref 26.0–34.0)
MCHC: 33 g/dL (ref 32.0–36.0)
MCV: 92 fL (ref 80–100)
Monocyte #: 0.8 x10 3/mm (ref 0.2–1.0)
Monocyte %: 10 %
Neutrophil #: 5.2 10*3/uL (ref 1.4–6.5)
Neutrophil %: 67.6 %
Platelet: 273 10*3/uL (ref 150–440)
RBC: 4.38 10*6/uL — ABNORMAL LOW (ref 4.40–5.90)
RDW: 15.6 % — ABNORMAL HIGH (ref 11.5–14.5)
WBC: 7.7 10*3/uL (ref 3.8–10.6)

## 2014-10-31 LAB — BASIC METABOLIC PANEL
Anion Gap: 6 — ABNORMAL LOW (ref 7–16)
BUN: 15 mg/dL (ref 7–18)
Calcium, Total: 8.1 mg/dL — ABNORMAL LOW (ref 8.5–10.1)
Chloride: 100 mmol/L (ref 98–107)
Co2: 23 mmol/L (ref 21–32)
Creatinine: 0.78 mg/dL (ref 0.60–1.30)
EGFR (African American): >60
EGFR (Non-African Amer.): >60
Glucose: 111 mg/dL — ABNORMAL HIGH (ref 65–99)
Osmolality: 260 (ref 275–301)
Potassium: 3.9 mmol/L (ref 3.5–5.1)
Sodium: 129 mmol/L — ABNORMAL LOW (ref 136–145)

## 2014-10-31 LAB — TSH: Thyroid Stimulating Horm: 2.5 u[IU]/mL

## 2014-10-31 LAB — APTT
Activated PTT: 45.8 secs — ABNORMAL HIGH (ref 23.6–35.9)
Activated PTT: 45.1 secs — ABNORMAL HIGH (ref 23.6–35.9)

## 2014-10-31 LAB — PROTIME-INR
INR: 1.2
INR: 1.2
Prothrombin Time: 15.4 secs — ABNORMAL HIGH (ref 11.5–14.7)
Prothrombin Time: 15.1 secs — ABNORMAL HIGH (ref 11.5–14.7)

## 2014-10-31 LAB — HEMOGLOBIN A1C: Hemoglobin A1C: 5.3 % (ref 4.2–6.3)

## 2014-11-01 LAB — BASIC METABOLIC PANEL
Anion Gap: 4 — ABNORMAL LOW (ref 7–16)
BUN: 18 mg/dL (ref 7–18)
CHLORIDE: 96 mmol/L — AB (ref 98–107)
Calcium, Total: 8 mg/dL — ABNORMAL LOW (ref 8.5–10.1)
Co2: 29 mmol/L (ref 21–32)
Creatinine: 0.84 mg/dL (ref 0.60–1.30)
EGFR (Non-African Amer.): >60
Glucose: 114 mg/dL — ABNORMAL HIGH (ref 65–99)
Osmolality: 262 (ref 275–301)
Potassium: 4.3 mmol/L (ref 3.5–5.1)
SODIUM: 129 mmol/L — AB (ref 136–145)

## 2014-11-01 LAB — CBC WITH DIFFERENTIAL/PLATELET
BASOS ABS: 0 10*3/uL (ref 0.0–0.1)
Basophil %: 0.3 %
Eosinophil #: 0.3 10*3/uL (ref 0.0–0.7)
Eosinophil %: 3.5 %
HCT: 36.5 % — AB (ref 40.0–52.0)
HGB: 12.2 g/dL — AB (ref 13.0–18.0)
LYMPHS PCT: 9.1 %
Lymphocyte #: 0.8 10*3/uL — ABNORMAL LOW (ref 1.0–3.6)
MCH: 30.5 pg (ref 26.0–34.0)
MCHC: 33.5 g/dL (ref 32.0–36.0)
MCV: 91 fL (ref 80–100)
MONOS PCT: 11.7 %
Monocyte #: 1 x10 3/mm (ref 0.2–1.0)
NEUTROS ABS: 6.3 10*3/uL (ref 1.4–6.5)
Neutrophil %: 75.4 %
Platelet: 240 10*3/uL (ref 150–440)
RBC: 4 10*6/uL — AB (ref 4.40–5.90)
RDW: 15.4 % — AB (ref 11.5–14.5)
WBC: 8.4 10*3/uL (ref 3.8–10.6)

## 2014-11-01 LAB — PROTIME-INR
INR: 1.2
Prothrombin Time: 15.1 secs — ABNORMAL HIGH (ref 11.5–14.7)

## 2014-11-02 ENCOUNTER — Ambulatory Visit: Payer: Medicare Other | Admitting: Cardiology

## 2014-11-02 LAB — PROTIME-INR
INR: 1.7
Prothrombin Time: 19.4 secs — ABNORMAL HIGH (ref 11.5–14.7)

## 2014-11-02 LAB — HEMOGLOBIN: HGB: 12 g/dL — ABNORMAL LOW (ref 13.0–18.0)

## 2014-11-02 LAB — BASIC METABOLIC PANEL
Anion Gap: 4 — ABNORMAL LOW (ref 7–16)
BUN: 16 mg/dL (ref 7–18)
CALCIUM: 8.1 mg/dL — AB (ref 8.5–10.1)
CHLORIDE: 93 mmol/L — AB (ref 98–107)
Co2: 30 mmol/L (ref 21–32)
Creatinine: 0.71 mg/dL (ref 0.60–1.30)
EGFR (Non-African Amer.): >60
Glucose: 114 mg/dL — ABNORMAL HIGH (ref 65–99)
Osmolality: 257 (ref 275–301)
Potassium: 4.2 mmol/L (ref 3.5–5.1)
Sodium: 127 mmol/L — ABNORMAL LOW (ref 136–145)

## 2014-11-03 LAB — BASIC METABOLIC PANEL
Anion Gap: 5 — ABNORMAL LOW (ref 7–16)
BUN: 14 mg/dL (ref 7–18)
Calcium, Total: 8.3 mg/dL — ABNORMAL LOW (ref 8.5–10.1)
Chloride: 94 mmol/L — ABNORMAL LOW (ref 98–107)
Co2: 30 mmol/L (ref 21–32)
Creatinine: 0.66 mg/dL (ref 0.60–1.30)
EGFR (African American): >60
GLUCOSE: 94 mg/dL (ref 65–99)
OSMOLALITY: 259 (ref 275–301)
Potassium: 4.4 mmol/L (ref 3.5–5.1)
SODIUM: 129 mmol/L — AB (ref 136–145)

## 2014-11-03 LAB — PROTIME-INR
INR: 1.8
PROTHROMBIN TIME: 20.9 s — AB (ref 11.5–14.7)

## 2014-11-03 LAB — HEMOGLOBIN: HGB: 12.1 g/dL — ABNORMAL LOW (ref 13.0–18.0)

## 2014-11-04 ENCOUNTER — Encounter: Payer: Self-pay | Admitting: Internal Medicine

## 2014-11-04 LAB — PROTIME-INR
INR: 2
Prothrombin Time: 22 secs — ABNORMAL HIGH (ref 11.5–14.7)

## 2014-11-05 LAB — BASIC METABOLIC PANEL
Anion Gap: 8 (ref 7–16)
BUN: 12 mg/dL (ref 7–18)
CALCIUM: 8.1 mg/dL — AB (ref 8.5–10.1)
CO2: 28 mmol/L (ref 21–32)
Chloride: 94 mmol/L — ABNORMAL LOW (ref 98–107)
Creatinine: 0.67 mg/dL (ref 0.60–1.30)
EGFR (African American): >60
EGFR (Non-African Amer.): >60
Glucose: 83 mg/dL (ref 65–99)
Osmolality: 260 (ref 275–301)
Potassium: 4.3 mmol/L (ref 3.5–5.1)
Sodium: 130 mmol/L — ABNORMAL LOW (ref 136–145)

## 2014-11-05 LAB — PROTIME-INR
INR: 2.1
PROTHROMBIN TIME: 22.8 s — AB (ref 11.5–14.7)

## 2014-11-07 LAB — PROTIME-INR
INR: 2.2
Prothrombin Time: 24.2 secs — ABNORMAL HIGH (ref 11.5–14.7)

## 2014-11-10 LAB — PROTIME-INR
INR: 2.7
PROTHROMBIN TIME: 27.9 s — AB (ref 11.5–14.7)

## 2014-11-11 ENCOUNTER — Ambulatory Visit: Payer: Self-pay | Admitting: Gerontology

## 2014-11-15 LAB — PROTIME-INR
INR: 4.9 — AB
Prothrombin Time: 44.4 secs — ABNORMAL HIGH (ref 11.5–14.7)

## 2014-11-17 LAB — PROTIME-INR
INR: 2.2
Prothrombin Time: 23.5 secs — ABNORMAL HIGH (ref 11.5–14.7)

## 2014-11-21 LAB — PROTIME-INR
INR: 2.7
PROTHROMBIN TIME: 27.6 s — AB (ref 11.5–14.7)

## 2014-11-23 LAB — PROTIME-INR
INR: 3.1
Prothrombin Time: 31.1 secs — ABNORMAL HIGH (ref 11.5–14.7)

## 2014-11-25 ENCOUNTER — Encounter: Payer: Self-pay | Admitting: Internal Medicine

## 2014-11-29 LAB — BASIC METABOLIC PANEL
Anion Gap: 5 — ABNORMAL LOW (ref 7–16)
BUN: 16 mg/dL (ref 7–18)
CALCIUM: 8.6 mg/dL (ref 8.5–10.1)
CHLORIDE: 94 mmol/L — AB (ref 98–107)
CO2: 28 mmol/L (ref 21–32)
Creatinine: 0.9 mg/dL (ref 0.60–1.30)
Glucose: 143 mg/dL — ABNORMAL HIGH (ref 65–99)
Osmolality: 259 (ref 275–301)
Potassium: 4.3 mmol/L (ref 3.5–5.1)
Sodium: 127 mmol/L — ABNORMAL LOW (ref 136–145)

## 2014-11-29 LAB — PROTIME-INR
INR: 3.8
PROTHROMBIN TIME: 36 s — AB (ref 11.5–14.7)

## 2014-12-06 LAB — BASIC METABOLIC PANEL
Anion Gap: 8 (ref 7–16)
BUN: 11 mg/dL (ref 7–18)
CALCIUM: 8.3 mg/dL — AB (ref 8.5–10.1)
Chloride: 92 mmol/L — ABNORMAL LOW (ref 98–107)
Co2: 28 mmol/L (ref 21–32)
Creatinine: 0.76 mg/dL (ref 0.60–1.30)
EGFR (Non-African Amer.): >60
GLUCOSE: 80 mg/dL (ref 65–99)
OSMOLALITY: 255 (ref 275–301)
POTASSIUM: 4.3 mmol/L (ref 3.5–5.1)
SODIUM: 128 mmol/L — AB (ref 136–145)

## 2014-12-06 LAB — PROTIME-INR
INR: 3.5
Prothrombin Time: 34.2 secs — ABNORMAL HIGH (ref 11.5–14.7)

## 2014-12-13 LAB — PROTIME-INR
INR: 3.7
Prothrombin Time: 35.6 secs — ABNORMAL HIGH (ref 11.5–14.7)

## 2014-12-13 LAB — BASIC METABOLIC PANEL
ANION GAP: 8 (ref 7–16)
BUN: 14 mg/dL (ref 7–18)
Calcium, Total: 8.8 mg/dL (ref 8.5–10.1)
Chloride: 93 mmol/L — ABNORMAL LOW (ref 98–107)
Co2: 30 mmol/L (ref 21–32)
Creatinine: 0.86 mg/dL (ref 0.60–1.30)
EGFR (African American): >60
GLUCOSE: 89 mg/dL (ref 65–99)
OSMOLALITY: 263 (ref 275–301)
POTASSIUM: 4.6 mmol/L (ref 3.5–5.1)
Sodium: 131 mmol/L — ABNORMAL LOW (ref 136–145)

## 2014-12-20 LAB — BASIC METABOLIC PANEL
Anion Gap: 7 (ref 7–16)
BUN: 20 mg/dL — AB (ref 7–18)
CREATININE: 0.89 mg/dL (ref 0.60–1.30)
Calcium, Total: 8.6 mg/dL (ref 8.5–10.1)
Chloride: 99 mmol/L (ref 98–107)
Co2: 30 mmol/L (ref 21–32)
EGFR (African American): >60
EGFR (Non-African Amer.): >60
Glucose: 80 mg/dL (ref 65–99)
Osmolality: 274 (ref 275–301)
POTASSIUM: 4.9 mmol/L (ref 3.5–5.1)
SODIUM: 136 mmol/L (ref 136–145)

## 2014-12-20 LAB — PROTIME-INR
INR: 4.4
Prothrombin Time: 40.7 secs — ABNORMAL HIGH (ref 11.5–14.7)

## 2014-12-21 ENCOUNTER — Ambulatory Visit: Payer: Medicare Other | Admitting: Cardiology

## 2014-12-23 LAB — PROTIME-INR
INR: 3
Prothrombin Time: 30.5 secs — ABNORMAL HIGH (ref 11.5–14.7)

## 2014-12-26 ENCOUNTER — Encounter: Payer: Self-pay | Admitting: Internal Medicine

## 2014-12-27 LAB — PROTIME-INR
INR: 2.9
PROTHROMBIN TIME: 30.6 s — AB

## 2014-12-27 LAB — BASIC METABOLIC PANEL
Anion Gap: 6 — ABNORMAL LOW (ref 7–16)
BUN: 15 mg/dL (ref 7–18)
CHLORIDE: 98 mmol/L (ref 98–107)
CREATININE: 0.91 mg/dL (ref 0.60–1.30)
Calcium, Total: 8.7 mg/dL (ref 8.5–10.1)
Co2: 30 mmol/L (ref 21–32)
GLUCOSE: 78 mg/dL (ref 65–99)
OSMOLALITY: 268 (ref 275–301)
Potassium: 4.6 mmol/L (ref 3.5–5.1)
Sodium: 134 mmol/L — ABNORMAL LOW (ref 136–145)

## 2015-01-16 ENCOUNTER — Telehealth: Payer: Self-pay | Admitting: Cardiology

## 2015-01-16 LAB — PROTIME-INR: INR: 1.8 — AB (ref 0.9–1.1)

## 2015-01-16 NOTE — Telephone Encounter (Signed)
Pt fell and broke hip, first Saturday in December, pt discharged from rehab facility 2 weeks ago on 01/02/15.  They were monitoring Coumadin at rehab facility.  [facility], pt is unable to drive at present, seeing surgeon for f/u next week and hopefully will be cleared to drive again.  Sending order for one time INR [facility] fax # [phone].  Will schedule next INR check in clinic after result received.  Pt verbalized understanding.

## 2015-01-16 NOTE — Telephone Encounter (Signed)
New Message  Pt wanted to resch [name] appt for next avail in April. Wanted to speak w/ Rn about coumadin appt- if he should wait for April or next avail. Please call back and discuss.

## 2015-01-16 NOTE — Telephone Encounter (Signed)
Forwarding to [facility]. Seeing Dr. Myrtis SerKatz on 03/20/15.

## 2015-01-17 ENCOUNTER — Ambulatory Visit (INDEPENDENT_AMBULATORY_CARE_PROVIDER_SITE_OTHER): Payer: Self-pay | Admitting: Cardiology

## 2015-01-17 DIAGNOSIS — Z952 Presence of prosthetic heart valve: Secondary | ICD-10-CM

## 2015-01-17 DIAGNOSIS — Z954 Presence of other heart-valve replacement: Secondary | ICD-10-CM

## 2015-01-17 DIAGNOSIS — I350 Nonrheumatic aortic (valve) stenosis: Secondary | ICD-10-CM

## 2015-01-17 DIAGNOSIS — Z7901 Long term (current) use of anticoagulants: Secondary | ICD-10-CM

## 2015-01-20 ENCOUNTER — Other Ambulatory Visit: Payer: Self-pay | Admitting: *Deleted

## 2015-01-20 MED ORDER — AMLODIPINE BESYLATE 10 MG PO TABS: ORAL_TABLET | ORAL | Status: DC

## 2015-01-24 ENCOUNTER — Encounter
Admit: 2015-01-24 | Disposition: A | Discharge status: Home / Self Care | Payer: Self-pay | Attending: Internal Medicine | Admitting: Internal Medicine

## 2015-01-25 ENCOUNTER — Ambulatory Visit (INDEPENDENT_AMBULATORY_CARE_PROVIDER_SITE_OTHER): Payer: Medicare Other | Admitting: *Deleted

## 2015-01-25 DIAGNOSIS — I359 Nonrheumatic aortic valve disorder, unspecified: Secondary | ICD-10-CM

## 2015-01-25 DIAGNOSIS — Z7901 Long term (current) use of anticoagulants: Secondary | ICD-10-CM

## 2015-01-25 DIAGNOSIS — Z952 Presence of prosthetic heart valve: Secondary | ICD-10-CM

## 2015-01-25 DIAGNOSIS — I350 Nonrheumatic aortic (valve) stenosis: Secondary | ICD-10-CM

## 2015-01-25 DIAGNOSIS — Z954 Presence of other heart-valve replacement: Secondary | ICD-10-CM

## 2015-01-25 LAB — POCT INR: INR: 1.5

## 2015-02-01 ENCOUNTER — Ambulatory Visit (INDEPENDENT_AMBULATORY_CARE_PROVIDER_SITE_OTHER): Payer: Medicare Other

## 2015-02-01 DIAGNOSIS — I350 Nonrheumatic aortic (valve) stenosis: Secondary | ICD-10-CM

## 2015-02-01 DIAGNOSIS — Z952 Presence of prosthetic heart valve: Secondary | ICD-10-CM

## 2015-02-01 DIAGNOSIS — I359 Nonrheumatic aortic valve disorder, unspecified: Secondary | ICD-10-CM

## 2015-02-01 DIAGNOSIS — Z954 Presence of other heart-valve replacement: Secondary | ICD-10-CM

## 2015-02-01 DIAGNOSIS — Z7901 Long term (current) use of anticoagulants: Secondary | ICD-10-CM

## 2015-02-01 LAB — POCT INR: INR: 1.6

## 2015-02-22 ENCOUNTER — Ambulatory Visit (INDEPENDENT_AMBULATORY_CARE_PROVIDER_SITE_OTHER): Payer: Medicare Other

## 2015-02-22 DIAGNOSIS — Z952 Presence of prosthetic heart valve: Secondary | ICD-10-CM

## 2015-02-22 DIAGNOSIS — Z7901 Long term (current) use of anticoagulants: Secondary | ICD-10-CM | POA: Diagnosis not present

## 2015-02-22 DIAGNOSIS — Z954 Presence of other heart-valve replacement: Secondary | ICD-10-CM

## 2015-02-22 DIAGNOSIS — I350 Nonrheumatic aortic (valve) stenosis: Secondary | ICD-10-CM

## 2015-02-22 DIAGNOSIS — I359 Nonrheumatic aortic valve disorder, unspecified: Secondary | ICD-10-CM | POA: Diagnosis not present

## 2015-02-22 LAB — POCT INR: INR: 2.2

## 2015-02-24 ENCOUNTER — Encounter
Admit: 2015-02-24 | Disposition: A | Discharge status: Home / Self Care | Payer: Self-pay | Attending: Internal Medicine | Admitting: Internal Medicine

## 2015-03-13 ENCOUNTER — Telehealth: Payer: Self-pay | Admitting: Cardiology

## 2015-03-13 NOTE — Telephone Encounter (Signed)
New message     Request for surgical clearance:  What type of surgery is being performed? Left total hip When is this surgery scheduled? Not scheduled 1. Are there any medications that need to be held prior to surgery and how long? Coumadin  Name of physician performing surgery? [facility] 2. What is your office phone and fax number? [phone]

## 2015-03-14 ENCOUNTER — Emergency Department
Admit: 2015-03-14 | Disposition: A | Discharge status: Home / Self Care | Payer: Self-pay | Admitting: Emergency Medicine

## 2015-03-14 LAB — URINALYSIS, COMPLETE
BLOOD: NEGATIVE
Bacteria: NONE SEEN
Bilirubin,UR: NEGATIVE
Glucose,UR: NEGATIVE mg/dL (ref 0–75)
Ketone: NEGATIVE
Leukocyte Esterase: NEGATIVE
Nitrite: NEGATIVE
Ph: 6 (ref 4.5–8.0)
Protein: NEGATIVE
SQUAMOUS EPITHELIAL: NONE SEEN
Specific Gravity: 1.014 (ref 1.003–1.030)
WBC UR: NONE SEEN /HPF (ref 0–5)

## 2015-03-14 LAB — CBC
HCT: 34.9 % — AB (ref 40.0–52.0)
HGB: 11.4 g/dL — ABNORMAL LOW (ref 13.0–18.0)
MCH: 27.6 pg (ref 26.0–34.0)
MCHC: 32.8 g/dL (ref 32.0–36.0)
MCV: 84 fL (ref 80–100)
Platelet: 318 10*3/uL (ref 150–440)
RBC: 4.14 10*6/uL — ABNORMAL LOW (ref 4.40–5.90)
RDW: 15.9 % — AB (ref 11.5–14.5)
WBC: 9.5 10*3/uL (ref 3.8–10.6)

## 2015-03-14 LAB — BASIC METABOLIC PANEL
Anion Gap: 6 — ABNORMAL LOW (ref 7–16)
BUN: 17 mg/dL
Calcium, Total: 8.4 mg/dL — ABNORMAL LOW
Chloride: 97 mmol/L — ABNORMAL LOW
Co2: 26 mmol/L
Creatinine: 0.7 mg/dL
EGFR (Non-African Amer.): >60
Glucose: 117 mg/dL — ABNORMAL HIGH
Potassium: 4 mmol/L
Sodium: 129 mmol/L — ABNORMAL LOW

## 2015-03-14 LAB — PROTIME-INR
INR: 3.1
PROTHROMBIN TIME: 31.8 s — AB

## 2015-03-14 NOTE — H&P (Signed)
PATIENT NAMTy Hilts:  [name], [name] MR#:  [id number] DATE OF BIRTH:  1931-04-01  DATE OF ADMISSION:  07/19/2012  CHIEF COMPLAINT: Right hip pain.   HISTORY OF PRESENT ILLNESS: The patient is a pleasant 81 year old male who was in his usual state of health up until early this afternoon when he suffered a fall off of a ladder onto his right lower extremity. He was unable to ambulate after the fall. He was brought to the Emergency Department,  and evaluated by Emergency Department  personnel, and found to have a right femoral neck fracture, and then I was consulted. Evaluation of the patient showed that he had significant groin pain but denied any open wounds, denied numbness or tingling in the right lower extremity.   PAST MEDICAL HISTORY: Significant for: 1. Aortic valve replacement done in 2002.  He is on chronic anticoagulation.  His current INR is 1.9.  2. Hyponatremia. 3. Hypertension. 4. Benign prostatic hypertrophy.   CURRENT MEDICATIONS:  1. Temazepam 15 mg orally once daily. 2. Proscar 5 mg orally once daily. 3. Fluoxetine 20 mg orally once daily.  4. Flomax 0.4 mg oral once daily. 5. Diovan 320 mg, 1 tablet oral daily.  6. Coumadin 5 mg, 1 tablet orally Monday, Wednesday and Friday and 1.5 mg, so a total of 7.5 mg orally on Tuesday, Thursday, Saturday and Sunday.  7. Centrum Silver 1 tab daily. 8. Aspirin 81 mg, 1 tab daily. 9. Amlodipine 10 mg oral, 1 tab daily.  PAST SURGICAL HISTORY:  1. Aortic valve replacement. 2. Bilateral hernia repairs.   ALLERGIES: No known drug allergies.   PHYSICAL EXAMINATION:  VITAL SIGNS: Temperature is 97.8, pulse 74, respirations 20, blood pressure 148/76, pulse oximetry 97% on room air.   HEENT: Pupils are equal, round and reactive to light and accommodation. His mucous membranes are moist.   NECK: Supple with no jugular venous distention.   ABDOMEN: Soft, nontender, nondistended.  He does have some mild flank pain on the right.   RIGHT LOWER  EXTREMITY: Examination of the right lower extremity shows that it is shortened and externally rotated.  He has significant pain with any motion of his right leg.  He is able to plantar flex, dorsiflex, and extensor hallucis longus of the lower extremity. Sensation is intact to light touch throughout the right lower extremity, and capillary refill is less than 2 seconds throughout the right lower extremity.  There are no open wounds or bleeding noted.   LABORATORY, DIAGNOSTIC AND RADIOLOGICAL DATA: His PTT is 42.2. His urinalysis is negative.  His INR is 1.9. Basic metabolic panel: Sodium 128, potassium 4.2, chloride 94, bicarbonate 26, BUN 14, creatinine 0.68 and glucose 110. CBC: WBC is 9.9, hemoglobin 14.9, hematocrit 43.1, and platelets are 252. Head CT scan was done and was positive for chronic change without any acute abnormalities related to his fall. He did have a rib x-ray with no evidence of acute abnormality on that either. X-rays of the right femur show a right femoral neck fracture, at least 50% displacement of the fracture.  ASSESSMENT: The patient is a 81 year old male with a right femoral neck fracture.   PLAN:  1. We will admit him to the floor.  2. We will make him n.p.o. after midnight in anticipation of possibly doing surgery tomorrow.   3. We will contact a hospitalist for evaluation of his valve replacement and try to figure out if we should bridge him with heparin while stopping his Coumadin, and I will leave  that up to the hospitalist to decide.  ____________________________ Dimas Alexandria. Jeanelle Malling, MD [id number]:cbb D: 07/19/2012 21:49:57 ET T: 07/20/2012 07:53:12 ET JOB#: [id number] Francis Gaines MD ELECTRONICALLY SIGNED 07/30/2012 17:49

## 2015-03-14 NOTE — Consult Note (Signed)
Chief Complaint:   Subjective/Chief Complaint doing well today, no evidence of recurrent bleeding.  denies n/v or abdominal pain.   VITAL SIGNS/ANCILLARY NOTES: **Vital Signs.:   01-Sep-13 14:20   Vital Signs Type Routine   Temperature Temperature (F) 98.7   Celsius 37   Temperature Source Oral   Pulse Pulse 75   Respirations Respirations 22   Systolic BP Systolic BP 491   Diastolic BP (mmHg) Diastolic BP (mmHg) 61   Mean BP 76   Pulse Ox % Pulse Ox % 94   Pulse Ox Activity Level  At rest   Oxygen Delivery Room Air/ 21 %   Brief Assessment:   Cardiac Regular    Respiratory clear BS    Gastrointestinal details normal Soft  Nontender  Nondistended  No masses palpable  Bowel sounds normal   Lab Results: Routine BB:  01-Sep-13 05:01    ABO Group + Rh Type O Positive   Antibody Screen NEGATIVE (Result(s) reported on 26 Jul 2012 at 08:15AM.)  Routine Chem:  30-Aug-13 03:10    BUN 16  31-Aug-13 04:11    BUN 13  01-Sep-13 05:01    Glucose, Serum  112   BUN 8   Creatinine (comp)  0.59   Sodium, Serum  125   Potassium, Serum 3.8   Chloride, Serum  92   CO2, Serum 26   Calcium (Total), Serum  7.9   Anion Gap 7   Osmolality (calc) 251   eGFR (African American) >60   eGFR (Non-African American) >60 (eGFR values <42m/min/1.73 m2 may be an indication of chronic kidney disease (CKD). Calculated eGFR is useful in patients with stable renal function. The eGFR calculation will not be reliable in acutely ill patients when serum creatinine is changing rapidly. It is not useful in  patients on dialysis. The eGFR calculation may not be applicable to patients at the low and high extremes of body sizes, pregnant women, and vegetarians.)  Routine Coag:  01-Sep-13 05:01    Prothrombin  26.0   INR 2.4 (INR reference interval applies to patients on anticoagulant therapy. A single INR therapeutic range for coumarins is not optimal for all indications; however, the suggested range  for most indications is 2.0 - 3.0. Exceptions to the INR Reference Range may include: Prosthetic heart valves, acute myocardial infarction, prevention of myocardial infarction, and combinations of aspirin and anticoagulant. The need for a higher or lower target INR must be assessed individually. Reference: The Pharmacology and Management of the Vitamin K  antagonists: the seventh ACCP Conference on Antithrombotic and Thrombolytic Therapy. CPHXTA.5697Sept:126 (3suppl): [id number] A HCT value >55% may artifactually increase the PT.  In one study,  the increase was an average of 25%. Reference:  "Effect on Routine and Special Coagulation Testing Values of Citrate Anticoagulant Adjustment in Patients with High HCT Values." American Journal of Clinical Pathology 29480;165:537-482)  Routine Hem:  30-Aug-13 03:10    Hemoglobin (CBC)  9.7 (Result(s) reported on 24 Jul 2012 at 03:36AM.)  31-Aug-13 04:11    Hemoglobin (CBC)  8.9 (Result(s) reported on 25 Jul 2012 at 04:25AM.)    19:35    Hemoglobin (CBC)  9.2  01-Sep-13 05:01    Hemoglobin (CBC)  9.0 (Result(s) reported on 26 Jul 2012 at 05:38AM.)    14:23    Hemoglobin (CBC)  9.5 (Result(s) reported on 26 Jul 2012 at 02:53PM.)   Assessment/Plan:  Assessment/Plan:   Assessment 1) GI bleed with occurance of a black stool and drop of  hgb in patient with chronic anticoagulation tx for aortic valve replacement.  No recurrence. On iv ppi drip. Now theraputic on coumadin post hip surgery/replacement.    Plan 1) h pylori serology 2) continue ppi, change to 40 mg iv bid tomorrow am, then to bid po before d/c. monitor for evidence of recurrent bleeding.   Electronic Signatures: Loistine Simas (MD)  (Signed 01-Sep-13 16:37)  Authored: Chief Complaint, VITAL SIGNS/ANCILLARY NOTES, Brief Assessment, Lab Results, Assessment/Plan   Last Updated: 01-Sep-13 16:37 by Loistine Simas (MD)

## 2015-03-14 NOTE — Consult Note (Signed)
PATIENT NAME:  [name] HiltsSCHENK, [place] MR#:  [id number] DATE OF BIRTH:  1931-02-08  DATE OF CONSULTATION:  07/19/2012  REFERRING PHYSICIAN:  Orthopedics CONSULTING PHYSICIAN:  Duncan Dulleresa Lucca Greggs, MD  REASON FOR CONSULTATION:  Coumadin management.  CHIEF COMPLAINT: Hip pain.   HISTORY OF PRESENT ILLNESS: [name] is a 81 year old white male with a history of aortic valve replacement in 2002 on chronic Coumadin since then, who was in his usual state of health today when he fell off of a ladder that he was standing on to change a light bulb this afternoon at home. He states that he was probably 2-1/2 to 3 feet off the ground and lost his balance and fell onto his left hip. He also notes pain in addition to his left hip in his right ribcage. He was not having any dizziness or loss of balance prior to this. His last meal was this morning. He also had a bowel movement this morning. He had been relatively pain free until he was moved for imaging studies, which have caused a great deal of pain, and he is currently endorsing pain at 7 out of 10. He was seen by orthopedics upon admission to the ER and has been scheduled tentatively for surgery of intertrochanteric fracture sometime tomorrow. We have been asked to manage his anticoagulation as he is on chronic Coumadin for aortic valve replacement. PT, INR today was 22,1.9. Last dose of Coumadin was Saturday evening.   PRIMARY CARE PHYSICIAN:  Dr. Yates DecampJohn Walker, [facility].  CARDIOLOGIST: Dr. Myrtis SerKatz at [facility]   PAST MEDICAL HISTORY:  1. [facility] valve [facility] in 2002 by Dr. Morley KosGephart. This was a St. Jude mechanical valve done due to progressive aortic stenosis.  2. History of hernia repair.  3. History of carotid bruit with normal Dopplers.  4. Coronary artery disease, mild by catheterization October 2011.  5. History of bradycardia, which is chronic.  6. History of depression with anxiety and insomnia.   CURRENT  MEDICATIONS:  1. Alprazolam 0.5 mg, 1/2 tablet by mouth as needed.  2. Amlodipine 10 mg daily.  3. Aspirin 81 mg daily.  4. Celebrex 100 mg daily as needed for arthritis pain.  5. Allegra 180 mg daily as needed for seasonal allergies. 6. Proscar or finasteride 5 mg by mouth daily.  7. Fluoxetine 20 mg by mouth daily.  8. Multivitamin one by mouth daily.  9. Flomax 0.4 mg by mouth daily.  10. Temazepam 15 mg by mouth at bedtime.  11. Diovan 320 mg by mouth daily.  Warfarin 5 mg by mouth daily. His last INR prior to hospitalization on July 31 at which time it was 2.4.   PAST SURGICAL HISTORY:  1. St. Jude's mechanical aortic valve 2002.  2. Hernia repair.   ALLERGIES: None.   LAST HOSPITALIZATION: October 2011. The patient was [facility] and [facility] for cardiac catheterization which showed mild nonobstructive disease.   FAMILY HISTORY: Noncontributory.   SOCIAL HISTORY: He is an ex-smoker, having quit 38 years ago. He is a social drinker, averaging 1 to 2 beers daily.   REVIEW OF SYSTEMS: CONSTITUTIONAL:  Negative for fever, fatigue, or weakness. He is endorsing 7/10 pain in his left hip currently. EENT:  No recent visual changes. No recent ear pain, seasonal rhinitis, or epistaxis. He denies postnasal drip, snoring, and sinus pain.  RESPIRATORY: Negative for cough, wheezing, or hemoptysis. He does note that his ribs are  painful to palpation but has no painful respirations currently. CARDIOVASCULAR: He denies any recent episodes of chest pain, orthopnea, or edema. GI: He has no recent bowel changes and denies nausea, vomiting, diarrhea, or abdominal pain. GU: He denies any dysuria or hematuria, but does have nocturia times four secondary to enlarged prostate. HEME: He has no history of anemia but does have easy bruising secondary to Coumadin use. MUSCULOSKELETAL: He denies any chronic neck, back, shoulder, knee, or hip pain. NEURO:  He denies any history  of numbness, weakness, dysarthria, CVAs, or transient ischemic attacks. He does have insomnia which has been nightly for the last two months, managed with temazepam.   PHYSICAL EXAMINATION:  GENERAL: This is a well-nourished, elderly male in no apparent distress.   VITAL SIGNS: Initial vital signs in Emergency Room: Temperature 96.3, pulse 65, respirations 20, systolic blood pressure 187/81, after 2 mg of morphine 167/75, and pulse oximetry 98% on room air. Pain scale at time of evaluation was 10/10.   HEENT: Pupils are equal, round, and reactive to light. Extraocular movements are intact. Sclerae are anicteric.  Oropharynx benign.  NECK: Supple without lymphadenopathy, JVD, thyromegaly, or tenderness to palpation.   LUNGS: Clear to auscultation bilaterally. No wheezing or rhonchi.   CARDIOVASCULAR: Bradycardic but normal with a notable click from his aortic valve. Chest wall is nontender. He is tender over the six and seventh ribs on the right. No bruising noted. There is no lower extremity edema. Pedal pulses are palpable.   ABDOMEN: Soft, nontender, nondistended with good bowel sounds and no evidence of hepatosplenomegaly. He is moving both arms and his right leg. Left leg is externally rotated.   SKIN: Skin is warm and dry without rashes or lesions.   LYMPH: There is no cervical, axillary, inguinal, or supraclavicular lymphadenopathy.   NEUROLOGICAL: Grossly nonfocal. He is alert and oriented to person, place, and time.   ADMISSION LABORATORY DATA: Sodium 128, potassium 4.2, chloride 94, bicarbonate 26, BUN 14, creatinine 0.68, glucose 110, hemoglobin 14.9, white count 9.9, platelets 252, proTime 22, INR 1.9. Urinalysis normal. Liver function tests normal except for AST of 42. EKG is still pending. Head CT done because of fall while anticoagulated shows diffuse cortical atrophy, low attenuation areas in the periventricular white matter regions. No skull fractures. Plain films of the right  hip show a femoral neck fracture, displaced. Unilateral rib films on the right are also pending.   ASSESSMENT AND PLAN:  1. Right displaced femoral neck fracture secondary to fall.  The patient is tentatively scheduled for surgical fixation tomorrow pending normalization of his INR.  2. History of aortic valve replacement with mechanical valve in 2002 with chronic Coumadin for anticoagulation. His INR is 1.9 today.  I will give him 2.5 mg of vitamin K and start him on IV heparin for bridging.  3. Rib pain, unilateral rib films of the right side have been ordered since he is tender to palpation.  4. Hypertension. Continue amlodipine and Diovan.  5. History of benign prostatic hypertrophy currently on Proscar and finasteride. We will continue both of these. Urinalysis is normal.   Thank you for the opportunity to evaluate Mr. Murch with his consult.   ESTIMATED TIME OF CARE: 40 minutes.    ____________________________ Duncan Dull, MD tt:bjt D: 07/19/2012 16:40:22 ET T: 07/20/2012 09:40:58 ET JOB#: [id number]  cc: Duncan Dull, MD, <Dictator> [name]. Danne Harbor, MD Duncan Dull MD ELECTRONICALLY SIGNED 08/13/2012 13:37

## 2015-03-14 NOTE — Consult Note (Signed)
Brief Consult Note: Diagnosis: gi bleed in the setting of h/o mechanical aortic valve replacement, recent hip surgery.   Patient was seen by consultant.   Consult note dictated.   Recommend further assessment or treatment.   Orders entered.   Comments: Patietn seen and examined.  Please see full dictated consult.  Patient admitted with right femoral neck fx, with hip repacement on 8/28.  Slow drift of hgb over the past 3 days, with a black stool reported (not recorded) this am.  No recurrent black stool.  One small episode of emesis of a dark material 3 am ago, not repeated.  No GI sx at home with exception of occasional dyspepsia.  However does take nsaids (clelbrex) occasionally at home.  Recommend serial hgb, transfuse as needed.  Should he have recurrent problems with bleeding, it may be necessary to interrupt coumadin, even to reversal depending on clinical situation, and do EGD.  However, patient is currently hemodynamically stable without repeated evidence of ongoing bleeding. Agree with protonix drip.  Consider cardiology consult in regard to possible other recs on anticoagulation  limitations. H. pylori serology.  Will follow with you.  Consult [phone]#[id number].  Electronic Signatures: Barnetta ChapelSkulskie, Jack Bolio (MD)  (Signed 31-Aug-13 18:59)  Authored: Brief Consult Note   Last Updated: 31-Aug-13 18:59 by Barnetta ChapelSkulskie, Mykhia Danish (MD)

## 2015-03-14 NOTE — Discharge Summary (Signed)
PATIENT NAMEARBOR, [name] [name] DATE OF BIRTH:  [birth year]  DATE OF ADMISSION:  07/19/2012 DATE OF DISCHARGE:  07/28/2012   ADMITTING DIAGNOSIS: Right subcapital hip fracture, displaced.  DISCHARGE DIAGNOSIS: Right subcapital hip fracture, displaced.   PROCEDURE: Right hip hemiarthroplasty.   ANESTHESIA: Spinal.   SURGEON: Leitha Schuller, MD    ESTIMATED BLOOD LOSS: 100 mL.   COMPLICATIONS: None.   SPECIMENS: Femoral head.   IMPLANTS: [name] universal hip bipolar component 53 mm outer diameter, 26 mm internal diameter, 26 mm posterior femoral head C taper with a size 4 cement plug, and size 8 ODC hip fracture stem, stem cemented.   HISTORY: The patient is a pleasant 81 year old male who was in his usual state of health up until 07/18/2012 when he suffered a fall off a ladder onto his right lower extremity. He was unable to ambulate after the fall. He was brought to the Emergency Department, evaluated by Emergency Department personnel, and found to have a right femoral neck fracture. Evaluation of the patient showed he had significant groin pain but denied any open wounds, denied numbness or tingling in the right lower extremity.   PHYSICAL EXAMINATION: HEENT: Pupils equal, round, and reactive to light and accommodation. His mucous membranes are moist. NECK: Supple. No jugular venous distention. ABDOMEN: Soft, nontender, nondistended. He does have some mild flank pain on the right. RIGHT LOWER EXTREMITY: Examination of the right lower extremity shows that it is shortened and externally rotated. He has significant pain with any motion of his leg. He is able to plantar flex, dorsiflex, and he has good extensor hallus longus range of motion. Sensation is intact to light touch throughout the right lower extremity and cap refill is normal less than 2 seconds throughout the right lower extremity. There are no open wounds or bleeding noted.   HOSPITAL COURSE: The patient was  admitted to the hospital on 07/19/2012. The patient had aortic valve replacement and was on blood thinners. Once we got his INR down he was ready for surgery on 07/22/2012. The patient had surgery on 07/22/2012. He was brought to the orthopedic floor from the PAC-U in stable condition. The patient was then bridged from heparin drip to Coumadin. On postop day one the patient was found to be hyponatremic at 125. He was given normal saline to try to get this up. The patient continued to be bridged from heparin drip down to the Coumadin. Druing patients post op stay, he was thought to have dark bloody stools and was monitored daily, he had no evidence of GI bleeding. He had slow progress with physical therapy initially. On 07/28/2012 the patient's INR was stable. He had progressed well with physical therapy and was ready for discharge to rehab facility.   DISCHARGE INSTRUCTIONS:  1. Routine prothrombin time. 2. He should be evaluated and treated by physical therapy for weightbearing as tolerated.  3. He should also have limitations such as hip flexion at 70 degrees with posterior hip precautions.  4. He should be evaluated and treated by occupational therapy. 5. Neuro checks on the operative leg every four hours. 6. He should wear TED hose, thigh-high, bilaterally.  7. Incentive spirometry q.1 hour.  8. Cough and deep breath q.2 hours.  9. On 08/07/2012 the patient should have staples removed and apply benzoin and Steri-Strips and a new dressing.  10. The patient should also follow-up with [facility] in six weeks.   DISCHARGE MEDICATIONS:  1. Protonix 40 mg b.i.d.  2. Alprazolam 0.5 mg oral at bedtime. 3. Temazepam capsule 50 mg oral at bedtime p.r.n. sleep/insomnia.  4. Valsartan tablet 320 mg oral daily.   6. Norvasc 10 mg oral daily.  7. Percocet 5/325 1 to 2 tablets q.6 hours p.r.n. pain.  8. Finasteride 5 mg oral daily.  9. Tamsulosin capsule 0.4 mg oral daily after meal. 10. Senokot 1  tablet oral b.i.d.  11. Milk of Magnesia 30 mL oral b.i.d. p.r.n. constipation. 12. Dulcolax 10 mg rectally daily p.r.n. constipation.  13. Alum-Mag hydrox with simethicone 400-400-40 mg/5 mL suspension. 14. Robaxin 750 mg oral q.6 hours p.r.n. spasms.  15. Coumadin 5 mg every 5 p.m. daily.  16. Bisacodyl 10 mg rectal daily.    ____________________________ [name] Slackhomas C. Gaines, [name] tcg:drc D: 07/28/2012 12:49:18 ET T: 07/28/2012 13:14:51 ET JOB#: [id number]  cc: [name] Slackhomas C. Gaines, [name], <Dictator> [name] ELECTRONICALLY SIGNED 08/03/2012 14:15

## 2015-03-14 NOTE — Op Note (Signed)
PATIENT NAMTy Hilts:  [name], [name] MR#:  [id number] DATE OF BIRTH:  03-18-1931  DATE OF PROCEDURE:  07/22/2012  PREOPERATIVE DIAGNOSIS: Right subcapital hip fracture, displaced.  POSTOPERATIVE DIAGNOSIS: Right subcapital hip fracture, displaced.   PROCEDURE: Right hip hemiarthroplasty.   ANESTHESIA: Spinal.   SURGEON: Leitha SchullerMichael J. Lonnetta Kniskern, MD    DESCRIPTION OF PROCEDURE: The patient was brought to the operating room and after adequate anesthesia was obtained the patient was placed on the pegboard with appropriate padding and held in a lateral position. The right hip was prepped and draped in the usual sterile fashion and appropriate patient identification and time-out procedures completed. A posterior approach was made with the incision centered over the greater trochanter. The gluteal fascia and IT band were incised and a Charnley retractor placed. With internal rotation of the hip, the short external rotators were detached and a T capsulotomy carried out. The fracture site was exposed and the femoral neck cut carried out. Following this the femoral head was removed and measured 53 mm. A 53 mm trial fit well. Next, a box osteotome was used on the proximal femur to create anteversion. Sequential broaching was carried out and femoral cement plug sizing carried out. A #4 cement plug was inserted at this time and the canal irrigated and dried. Cement was mixed and inserted down the canal with a #8 ODC hip fracture stem inserted. After the cement had set and excess cement was removed, a size 0 C-taper 26 mm head was impacted followed by the 53 mm bipolar component after having checked to make sure the acetabulum was free of debris. The hip was reduced. The capsulotomy was repaired using #1 Vicryl. The wound was thoroughly irrigated and meticulous hemostasis achieved with electrocautery as well as the argon beam cautery to minimize bone bleeding. The gluteal fascia was repaired using a running heavy quill suture  followed by subcutaneous drain, 2-0 quill subcutaneously, and skin staples. Xeroform, 4 x 4's, ABD, and tape were applied along with an abduction pillow. The patient was sent to the recovery room in stable condition.   ESTIMATED BLOOD LOSS: 100 mL.   COMPLICATIONS: None.   SPECIMENS: Femoral head. It had a benign appearance.   IMPLANTS: [name] Universal head bipolar component 53 mm outer diameter, 26 mm internal diameter, 26 mm +0 femoral head C taper with a size 4 cement plug and size 8 ODC hip fracture stem, stem cemented.   ____________________________ Leitha SchullerMichael J. Josephene Marrone, MD mjm:drc D: 07/22/2012 21:39:47 ET T: 07/23/2012 09:24:44 ET JOB#: [id number]  cc: Leitha SchullerMichael J. Jermiya Reichl, MD, <Dictator> Leitha SchullerMICHAEL J Miri Jose MD ELECTRONICALLY SIGNED 07/23/2012 13:31

## 2015-03-14 NOTE — Consult Note (Signed)
PATIENT NAME:  [name], [name] MR#:  [id number] DATE OF BIRTH:  16-Dec-1930  DATE OF CONSULTATION:  07/25/2012  REFERRING PHYSICIAN:  Kennedy Bucker, MD CONSULTING PHYSICIAN:  Christena Deem, MD  PRIMARY CARE PHYSICIAN: Aram Beecham, MD  REASON FOR CONSULTATION: Gastrointestinal bleed.   HISTORY OF PRESENT ILLNESS: Mr. Roskelley is a 82 year old Caucasian male who was admitted to the hospital on 07/19/2012 with a right femur neck fracture. He has a history of aortic valve replacement, on chronic Coumadin treatment. His aortic valve is a mechanical valve that was placed in 2002. He had a surgery for the right femoral neck fracture on 07/22/2012 with Dr. Rosita Kea. There was a heparin bridge used while he was off the Coumadin. Over the period of the past several days, there has been a slow drift of his hemoglobin and a single dark stool was reported this morning. He has had no recurrence of that over the course of the day. He has had no problems with nausea or vomiting with the exception of very short nausea immediately after his surgery. He did however throw up a small amount of material that could have been coffee grounds in description on Thursday morning. However, he was then given some Pepto-Bismol yesterday morning which will also turn stool black. He had one short episode of right lower quadrant discomfort that lasted only for a very short period of time, perhaps minutes, yesterday. Prior to coming to the hospital he was having a daily bowel movement, no black stools, blood in the stools, or slimy stools. He was not on a prescribed anti-acid medication, however, he was occasionally taking over-the-counter medications such as Zantac or Prilosec. About a week ago he took a Nexium, however, he and I are both unclear as to where that could have come from. He does have a history of peptic ulcer disease in 1969. He occasionally takes a Celebrex, perhaps once or twice a week. He does not take any other  over-the-counter aspirin or NSAID products. He has had an upper scope done many years ago. His last colonoscopy was over 10 years ago. His first colonoscopy many years before that showed a single colon polyp and there has been no further development of polyps noted on several colonoscopies subsequently. He has currently been restarted on Coumadin and the heparin bridge has been discontinued as of this morning. Further, he was started on a Protonix drip yesterday. With the clinical situation currently, his enteric-coated aspirin 81 mg had been held and his heparin bridge had been held, however, he is continuing on the Coumadin 7.5 mg every 5:00 p.m. His pro time this morning was 1.6, INR.   PAST MEDICAL HISTORY:  1. Hypertension.  2. Benign prostatic hypertrophy.  3. Hyponatremia.  4. Aortic valve replacement in 2002, as noted above, on chronic Coumadin treatment.  5. Bilateral inguinal herniorrhaphies.  6. Colon polyps as outlined.   OUTPATIENT MEDICATIONS:  1. Amlodipine 10 mg.  2. Aspirin 81 mg. 3. Centrum Silver a tablet once a day. 4. Coumadin 5 mg Monday, Wednesday and Friday; 7.5 mg Tuesday, Thursday, Saturday and Sunday.  5. Flomax 0.4 mg once a day. 6. Fluoxetine 20 mg a day  7. Proscar 5 mg a day. 8. Temazepam 15 mg once a day.  9. He apparently in the past had been taking ibuprofen 800 mg three times daily. This prescription date stopped in March 2013.  10. Celebrex 200 mg one to three times a week.   ALLERGIES: No known drug allergies.  PHYSICAL EXAMINATION:   VITAL SIGNS: Temperature 97.5, pulse 82, respirations 20, blood pressure 119/64, and pulse oximetry 99%.   GENERAL: He is a 81 year old Caucasian male in no acute distress.   HEAD/FACE:  Normocephalic, atraumatic.   EYES: Anicteric.   NOSE: Septum midline.   OROPHARYNX: Fair dentition.   NECK: Supple. No JVD.   HEART: Regular rate and rhythm. Mechanical click with aortic valve.   LUNGS: Bilaterally clear.    ABDOMEN: Soft, nontender, and nondistended. Bowel sounds are positive, normoactive.   RECTAL: Anorectal exam is deferred. He has had no recurrent stools over the course of the day.   EXTREMITIES: No clubbing, cyanosis, or edema.   NEUROLOGICAL: Cranial nerves II through XII grossly intact. Muscle strength bilaterally equal and symmetric. Please note fresh right hip surgery.  LABS/RADIOLOGIC STUDIES: On admission to the hospital, he had a BUN of 14, creatinine 0.68, sodium 128, and potassium 4.2. Hepatic profile is showing an AST of 42, otherwise normal. His hemogram showed a white count of 9.9, hemoglobin and hematocrit 14.9/43.1, platelet count 252, and MCV 92. His pro time on admission was 22.0, an INR of 1.9, and he had an activated PTT of 42.2.   A urinalysis was normal.   His hemoglobin on 07/21/2012 was 13.7, on 07/22/2012 it was 12.2, on 07/23/2012 it was 12.4, later on 07/23/2012 it was 11.1, on 07/24/2012 it was 9.7, and this morning at 0411 it was 8.9. There has not been a repeat hemoglobin since this morning at 0411. His INR this morning was 1.6, his BUN was 13, and creatinine was 0.6.   His last hip x-ray was on 07/22/2012 this status post right hip hemiarthroplasty: No immediate postoperative bone or hardware complication.   ASSESSMENT: A black stool reported this morning with a relatively slower drift of hemoglobin over the period of the past several days. The patient has a history of aortic valve replacement, has been taken off of that for the period of time of his hip replacement, then placed back on a heparin bridge as well as Coumadin. The bridge has now been discontinued. Pro time is stated above. He has had no recurrent evidence of bleeding over the course of the day. Of note, his BUN this morning matches that of his admission. Further, he has been using NSAIDs occasionally at home. He was given some Pepto-Bismol yesterday which would cause a darkening of the stool. He did  also however have an episode of throwing up some dark material on Thursday morning. This has not been repeated as well. He is currently on an IV Protonix drip.   RECOMMENDATIONS:  1. Would check hemoglobin every 8 hours x3, transfuse as needed.  2. We will obtain a Helicobacter pylori serology.  3. Continue proton pump inhibitor as you are with intent to transition to IV twice a day then to p.o. twice a day. Would continue Coumadin as you are. If he shows evidence of repeat bleeding this may need to be stopped. 4. I would also recommend a cardiology consult in regard to anticoagulation management in this more complex clinical situation. Again, his pro time when he came in was about 1.9. We might be able to consider this as a target, at least for now. We will need to discuss further with cardiology.  5. Should he show evidence of repeat bleeding, it may be necessary to consider endoscopy/EGD.   We will follow with you. ____________________________ Christena Deem, MD mus:slb D: 07/25/2012 18:48:57 ET T: 07/26/2012  08:09:06 ET JOB#: [id number]  cc: Christena DeemMartin U. Oleg Oleson, MD, <Dictator> Christena DeemMARTIN U Mariselda Badalamenti MD ELECTRONICALLY SIGNED 08/17/2012 9:04

## 2015-03-14 NOTE — Consult Note (Signed)
Chief Complaint:   Subjective/Chief Complaint large loose bowel movement this am, not bloody, dark brown by patietn description. mild rlq discomfort occasionally (note right hip replacement last week )   VITAL SIGNS/ANCILLARY NOTES: **Vital Signs.:   02-Sep-13 13:42   Vital Signs Type Routine   Temperature Temperature (F) 98   Celsius 36.6   Temperature Source Oral   Pulse Pulse 79   Respirations Respirations 17   Systolic BP Systolic BP 604   Diastolic BP (mmHg) Diastolic BP (mmHg) 64   Mean BP 78   Pulse Ox % Pulse Ox % 98   Pulse Ox Activity Level  At rest   Oxygen Delivery Room Air/ 21 %  *Intake and Output.:   02-Sep-13 08:52   Stool  Huge loose   Brief Assessment:   Cardiac Regular  mechanical valve    Respiratory clear BS    Gastrointestinal details normal Soft  Nontender  Nondistended  No masses palpable  Bowel sounds normal  No rebound tenderness   Lab Results: Routine Chem:  02-Sep-13 04:34    Glucose, Serum  106   BUN 7   Creatinine (comp)  0.55   Sodium, Serum  128   Potassium, Serum 3.9   Chloride, Serum  94   CO2, Serum 28   Calcium (Total), Serum  8.0   Anion Gap  6   Osmolality (calc) 255   eGFR (African American) >60   eGFR (Non-African American) >60 (eGFR values <23m/min/1.73 m2 may be an indication of chronic kidney disease (CKD). Calculated eGFR is useful in patients with stable renal function. The eGFR calculation will not be reliable in acutely ill patients when serum creatinine is changing rapidly. It is not useful in  patients on dialysis. The eGFR calculation may not be applicable to patients at the low and high extremes of body sizes, pregnant women, and vegetarians.)  Routine Coag:  02-Sep-13 04:34    Prothrombin  30.0   INR 2.8 (INR reference interval applies to patients on anticoagulant therapy. A single INR therapeutic range for coumarins is not optimal for all indications; however, the suggested range for most indications  is 2.0 - 3.0. Exceptions to the INR Reference Range may include: Prosthetic heart valves, acute myocardial infarction, prevention of myocardial infarction, and combinations of aspirin and anticoagulant. The need for a higher or lower target INR must be assessed individually. Reference: The Pharmacology and Management of the Vitamin K  antagonists: the seventh ACCP Conference on Antithrombotic and Thrombolytic Therapy. CVWUJW.1191Sept:126 (3suppl): [id number] A HCT value >55% may artifactually increase the PT.  In one study,  the increase was an average of 25%. Reference:  "Effect on Routine and Special Coagulation Testing Values of Citrate Anticoagulant Adjustment in Patients with High HCT Values." American Journal of Clinical Pathology 2006;126:400-405.)  Routine Hem:  02-Sep-13 04:34    WBC (CBC) 5.9   RBC (CBC)  2.88   Hemoglobin (CBC)  9.1   Hematocrit (CBC)  26.0   Platelet Count (CBC) 305   MCV 90   MCH 31.7   MCHC 35.1   RDW 13.8   Neutrophil % 70.8   Lymphocyte % 12.7   Monocyte % 12.6   Eosinophil % 3.6   Basophil % 0.3   Neutrophil # 4.2   Lymphocyte #  0.8   Monocyte # 0.7   Eosinophil # 0.2   Basophil # 0.0 (Result(s) reported on 27 Jul 2012 at 05:11AM.)   Assessment/Plan:  Assessment/Plan:   Assessment 1)  GIB-stable not recurrent.  on bid iv protonix. stable hgb. no abdominal pain. now fully anticoagulated.    Plan 1) change iv ppi to bid 40 mg protonix 45 minutes ac tomorrow.  continue that until GI fu as o/p in 3-4 weeks.  will need to monitor o/p hgb.   Dr Vira Agar to see tomorrow.   Electronic Signatures: Loistine Simas (MD)  (Signed 02-Sep-13 15:27)  Authored: Chief Complaint, VITAL SIGNS/ANCILLARY NOTES, Brief Assessment, Lab Results, Assessment/Plan   Last Updated: 02-Sep-13 15:27 by Loistine Simas (MD)

## 2015-03-15 ENCOUNTER — Ambulatory Visit
Admit: 2015-03-15 | Disposition: A | Discharge status: Home / Self Care | Payer: Self-pay | Attending: Internal Medicine | Admitting: Internal Medicine

## 2015-03-16 NOTE — Telephone Encounter (Signed)
Reviewed pt's chart.  He has an AVR but no history of Afib or TIA/CVA. Discussed with Dr. Myrtis SerKatz.  Okay to hold Coumadin x 5 days prior to procedure with no Lovenox bridge required.  [facility] given surgery is scheduled for next week. Pt is aware to start holding his Coumadin today.

## 2015-03-17 ENCOUNTER — Telehealth: Payer: Self-pay | Admitting: Cardiology

## 2015-03-17 NOTE — Telephone Encounter (Signed)
New message        Pt would like to know if this appt is necessary for him due to him having surgery on Tuesday   Please give pt a call

## 2015-03-17 NOTE — Telephone Encounter (Signed)
I was reviewing this chart this morning and saw this telephone note from this morning. The patient does not need to come in for the office visit since he is having the surgical procedure on Tuesday. Please move the visit to a later date that is convenient for him.     The pt is advised per Dr Myrtis SerKatz of above. The pt verbalized understanding.  Appt rescheduled for 05/26/15 at 1:45 and the pt is in agreement with date and time of appt.

## 2015-03-18 NOTE — Consult Note (Signed)
Brief Consult Note: Diagnosis: Left impacted, nondisplaced femoral neck hip fracture.   Patient was seen by consultant.   Consult note dictated.   Recommend to proceed with surgery or procedure.   Orders entered.   Comments: Will give vitamin K today.  Hope to perform surgery tomorrow if INR lower.  Electronic Signatures: Juanell FairlyKrasinski, Doyt Castellana (MD)  (Signed 06-Dec-15 13:25)  Authored: Brief Consult Note   Last Updated: 06-Dec-15 13:25 by Juanell FairlyKrasinski, Siyona Coto (MD)

## 2015-03-18 NOTE — H&P (Signed)
PATIENT NAME:  [name], [name] MR#:  [id number] DATE OF BIRTH:  Jan 05, 1931  DATE OF ADMISSION:  10/30/2014  REFERRING PHYSICIAN: Rebecka ApleyAllison P. Webster, MD  PRIMARY CARE PHYSICIAN: [name], MD   ADMISSION DIAGNOSIS: Left hip fracture.   HISTORY OF PRESENT ILLNESS: This is an 81 year old Caucasian male who presents to the Emergency Department complaining of hip pain. The patient reports tripping, approximately 2 hours prior to arrival and falling on his left side and hitting the left side of his chest against [name] handicap ramp. The patient was helped into [name] vehicle by his son following his accident and driven to the Emergency Department. It is noted, however, that he cannot bear weight on the hip enough to safely ambulate, although he can move of the leg. X-ray in the Emergency Department revealed [name] left subcapital femoral neck fracture of the left hip and the Emergency Department staff called for admission.   REVIEW OF SYSTEMS:  CONSTITUTIONAL: The patient denies fever or weakness.  EYES: Denies inflammation or blurred vision.  EARS, NOSE AND THROAT: Denies tinnitus or sore throat.  RESPIRATORY: Denies cough or shortness of breath.  CARDIOVASCULAR: Admits to chest wall pain but denies palpitations, orthopnea, or paroxysmal nocturnal dyspnea.  GASTROINTESTINAL: Denies nausea, vomiting, diarrhea, or abdominal pain.  GENITOURINARY: Denies dysuria, increased frequency or hesitancy of urination.  ENDOCRINE: Denies polyuria or polydipsia.  HEMATOLOGIC AND LYMPHATIC: Admits to easy bruising and easy bleeding when cut.  INTEGUMENT: Denies rashes or lesions.  MUSCULOSKELETAL: Denies arthralgias or myalgias.  NEUROLOGIC: Denies numbness in his extremities or difficulty speaking.  PSYCHIATRIC: Denies depression or suicidal ideation.   PAST MEDICAL HISTORY: Hypertension, benign prostatic hypertrophy.   PAST SURGICAL HISTORY: Aortic valve replacement with [employer]'s valve, bilateral inguinal  hernia repairs.   SOCIAL HISTORY: The patient lives with his wife. He drinks wine in the evenings with dinner sometimes. He does not smoke or do any illegal drugs.   FAMILY HISTORY: Hypertension runs throughout the family. His father is deceased of stroke and his mother has diabetes mellitus type 2.   MEDICATIONS:  1.  Amlodipine 10 mg 1 tablet p.o. daily.  2.  Aspirin 81 mg delayed release 1 tablet p.o. daily.  3.  Coumadin 5 mg 1 tablet p.o. daily.  4.  Coumadin 7.5 mg 1 tablet p.o. daily.  5.  Finasteride 5 mg 1 tablet p.o. daily.  6.  Fluoxetine 20 mg 1 tablet p.o. daily.  7.  Multivitamin 1 tablet p.o. daily.  8.  Tamsulosin 0.4 mg 1 capsule p.o. daily.  9.  Trazodone dose unknown 1 tablet p.o. at bedtime.  10.  Valsartan 320 mg 1 tablet p.o. daily.   ALLERGIES: No known drug allergies.   PERTINENT LABORATORY RESULTS AND RADIOGRAPHIC FINDINGS: Serum glucose is 110, BUN 26, creatinine 1.07. Sodium is 136, potassium is 3.9, chloride 101, bicarbonate 24, calcium is 8.4. Troponin is negative. White blood cell count is 12.9, hemoglobin is 13.3, hematocrit 41.6, platelet count is 320,000. MCV is 92. INR is 3. Chest x-ray shows no acute cardiopulmonary abnormality. X-ray of the left hip shows [name] minimally displaced subcapital femoral neck fracture on the left.   PHYSICAL EXAMINATION:  VITAL SIGNS: Temperature is 97.5, pulse 83, respirations 18, blood pressure 165/119, pulse oximetry is 97% on room air.  GENERAL: The patient is alert and oriented x 3 in no apparent distress.  HEENT: Normocephalic, atraumatic. Pupils equal, round, and reactive to light and accommodation. Extraocular movements are intact. Mucous membranes  are tacky.  NECK: Trachea is midline. No adenopathy.  CHEST: Symmetric but the patient does have tenderness to palpation along the ribs on the left lateral upper portion of his chest. There is no crepitus or visible bruising at this time.  CARDIOVASCULAR: Regular rate and  rhythm. Normal S1, S2. No rubs, clicks, or murmurs appreciated.  LUNGS: Clear to auscultation bilaterally. Normal effort and excursion.  ABDOMEN: Positive bowel sounds. Soft, nontender, nondistended. No hepatosplenomegaly.  GENITOURINARY: Deferred.  MUSCULOSKELETAL: The patient moves his right leg with full range of motion and his upper extremities equally. There is 5/5 strength in the upper extremities as well as his right lower limb.  I did not test his left lower leg, as I know this might elicit pain.  SKIN: No rashes or lesions.  EXTREMITIES: No clubbing, cyanosis, or edema.  NEUROLOGIC: Cranial nerves II- XII are grossly intact.  PSYCHIATRIC: Mood is normal. Affect is congruent.   ASSESSMENT AND PLAN: This is an 81 year old male admitted for left hip fracture.  1.  Fracture of the left hip. Orthopedic surgery has been consulted. The patient is [name] moderate to high risk for surgery given his age, his prostatic valve, and hypertension. Once the latter is managed he can be cleared for [name] non cardiothoracic surgical procedure. I will also check [name] vitamin D level to make sure the patient is not severely osteopenic or osteoporotic.  2.  Hypertension. We will restart the patient's home medications. His blood pressure is likely uncontrolled at the moment due to pain.  3.  Prostatic valve. The patient had an aortic valve replacement back in 2002. 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 He will have to stop taking Coumadin for 5 days prior to surgery. 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  4.  Benign prostatic hypertrophy. Continue tamsulosin and finasteride.  5.  Leukocytosis. It is mild at this time and likely related to stress response from pain,  as the patient has been riding in [name] vehicle for quite some time prior evaluation in the Emergency Department and obtaining these lab values.  6.  Overweight. The patient's BMI is 28.5. I have encouraged [name] heart healthy diet and portion control.  7.  Deep vein thrombosis prophylaxis: Heparin.  8.  Gastrointestinal prophylaxis: None.   CODE STATUS: The patient is [name] full code.   TIME SPENT ON ADMISSION ORDERS AND PATIENT CARE: Approximately 35 minutes.    ____________________________ Kelton Pillar. Sheryle Hail, MD msd:bm D: 10/30/2014 04:01:41 ET T: 10/30/2014 05:15:59 ET JOB#: [id number]  cc: Kelton Pillar. Sheryle Hail, MD, <Dictator> Kelton Pillar Sadonna Kotara MD ELECTRONICALLY SIGNED 10/31/2014 1:39

## 2015-03-18 NOTE — Discharge Summary (Signed)
PATIENT NAME:  [name], [name] A MR#:  [id number] DATE OF BIRTH:  1931-04-17  DATE OF ADMISSION:  10/30/2014 DATE OF DISCHARGE:    DISCHARGE DIAGNOSES: 1.  Acute left hip fracture status post internal fixation on 10/31/2014.  2.  Chronic hyponatremia with sodium 129 on discharge.  3.  Anticoagulation therapy with prosthetic valve, currently on Coumadin.   DISCHARGE MEDICATIONS: 1.  Lovenox 30 mg subcutaneous injection b.i.d. to be continued until INR is therapeutic x 48 hours.  2.  Finasteride 5 mg p.o. daily.  3.  Valsartan 320 mg p.o. daily.  4.  Amlodipine 10 mg p.o. daily.  5.  Fluoxetine 20 mg p.o. daily.  6.  Tamsulosin 0.4 mg p.o. daily.  7.  Coumadin 7.5 mg p.o. daily.  8.  Trazodone 50 mg p.o. at bedtime p.r.n. for insomnia.  9.  Zyrtec 10 mg p.o. daily.  10.   Requip 0.25 mg p.o. at bedtime.  11.   Acetaminophen 325 two tabs q. 4 hours as needed for pain and fever.  12.   Oxycodone 5 mg p.o. q. 4 hours as needed for moderate pain. This is to be dosed then change per Dr. Martha ClanKrasinski.  13.   Glycerin 1 suppository every 12 hours as needed for constipation.  14.   Docusate 100 mg b.i.d. as needed for constipation.  15.   MiraLax 17 grams p.o. daily as needed for constipation.   CONSULTATIONS: Orthopedics.   PROCEDURES: Internal fixation on 10/31/2014.   PERTINENT LABORATORIES ON DAY OF DISCHARGE: Sodium 129, potassium 4.4, creatinine 0.68, glucose 94, hemoglobin 12.1, INR 1.8.   BRIEF HOSPITAL COURSE:  1.  Acute left hip fracture. The patient initially came in with acute left hip fracture and underwent internal fixation and repair per Dr. Martha ClanKrasinski on 10/31/2014 without any complications. He does also have a history of prosthetic valve on Coumadin. His INR was reversed with vitamin K and then after surgery was bridged with Lovenox. Currently his INR is still subtherapeutic at 1.8. I did bump up the Coumadin to 7.5 mg daily and he needs to be bridged for at least 48 hours of  therapeutic INR before being discontinued on the Lovenox.  2.  Hyponatremia. This is a chronic issue. I asked him to avoid free water. He may drink Gatorade G2 as needed.  3.  Other chronic issues are stable at this time. He may be discharged once his constipation resolves and he has a bowel movement. Will need PT, OT, and nursing care upon discharge.   Pain medications and Lovenox are to be prescribed per Dr. Martha ClanKrasinski. The patient is being discharged to a SNF.    ____________________________ Marisue IvanKanhka Tarick Parenteau, MD kl:at D: 11/03/2014 08:36:53 ET T: 11/03/2014 09:18:07 ET JOB#: [id number]  cc: Marisue IvanKanhka Sirinity Outland, MD, <Dictator> Marisue IvanKANHKA Marra Fraga MD ELECTRONICALLY SIGNED 11/15/2014 15:28

## 2015-03-18 NOTE — Consult Note (Signed)
PATIENT NAME:  [name], [name] MR#:  [id number] DATE OF BIRTH:  February 05, 1931  DATE OF CONSULTATION:  10/30/2014  CONSULTING PHYSICIAN:  Kathreen DevoidKevin L. Manisha Cancel, MD  REASON FOR CONSULTATION: Left femoral neck hip fracture.   HISTORY OF PRESENT ILLNESS: [name] is an 81 year old male who had who tripped and fell against the handicap ramp on their van. He landed on his left side, injuring his left hip. He was brought to the [facility] where he was diagnosed with [name] left impacted, nondisplaced femoral neck hip fracture. He was admitted the hospitalist service and orthopedics was consulted for management of the hip fracture.   PAST MEDICAL HISTORY: Includes hypertension, benign prostatic hypertrophy, aortic valve replacement.   PAST SURGICAL HISTORY: Aortic valve replacement with St. Jude's valve, bilateral inguinal hernia repairs.   SOCIAL HISTORY: The patient lives his wife. He drinks wine in the evenings with dinner, but does not smoke or use drugs.   MEDICATIONS: Include amlodipine 10 mg p.o. daily, aspirin 81 mg daily, Coumadin 5 mg daily, finasteride 5 mg oh, 1 tablet p.o. daily, fluoxetine 20 mg daily, multivitamin 1 tablet daily, tamsulosin 0.4 mg 1 tablet p.o. daily, trazodone unknown dose 1 tablet at bedtime, valsartan 320 mg 1 tablet p.o. daily. The patient alternates 5 and 7.5 mg of Coumadin daily.   ALLERGIES: No known drug allergies.   PHYSICAL EXAMINATION: Left hip region. Skin is intact. There is no erythema, ecchymosis or swelling. The thigh and leg compartments are soft and compressible. He has no calf tenderness. He has palpable pedal pulses. He can dorsiflex and plantarflex his ankle and flex and extend his toes. His foot appears well perfused.   RADIOLOGY: X-ray films of the pelvis and left hip reveal [name] nondisplaced, impacted femoral neck hip fracture. He has [name] previous hemiarthroplasty on the right side performed in 2013.   ASSESSMENT: Left impacted  nondisplaced neck hip fracture.   PLAN: I explained to the patient and his daughter, who was also in the room with him, about his fracture. I used the white board in his room to explain the proposed surgery. I reviewed the risks and benefits of surgery with the patient including infection, bleeding, nerve or blood vessel injury, malunion, nonunion, avascular necrosis, leg length discrepancy, change in lower extremity rotation, screw breakage, persistent left hip pain and the need for further surgery including conversion to [name] hemiarthroplasty or total hip arthroplasty. Medical risks include, but are not limited to, DVT and pulmonary embolism, myocardial infarction, stroke, pneumonia, respiratory failure and death. The patient understood these risks. I also reviewed the risks and benefits of blood transfusions if necessary. I answered all of his questions. The patient is going to be given vitamin K to reverse his INR which was 3.0 upon arrival. He will be n.p.o. after midnight and I am hopeful to proceed with surgery tomorrow. He understands he will be touchdown weight-bearing on the left lower extremity for 6 weeks postoperative.      ____________________________ Kathreen DevoidKevin L. Tiffny Gemmer, MD klk:TT D: 10/30/2014 13:30:31 ET T: 10/30/2014 14:03:58 ET JOB#: [id number]  cc: Kathreen DevoidKevin L. Louann Hopson, MD, <Dictator> Kathreen DevoidKEVIN L Gilbert Narain MD ELECTRONICALLY SIGNED 11/01/2014 18:09

## 2015-03-18 NOTE — Op Note (Signed)
PATIENT NAME:  [name], [name] MR#:  [id number] DATE OF BIRTH:  1931/09/21  DATE OF PROCEDURE:  10/31/2014  PREOPERATIVE DIAGNOSIS: Left impacted femoral neck hip fracture.   POSTOPERATIVE DIAGNOSIS: Left impacted femoral neck hip fracture.   PROCEDURE: Percutaneous fixation of left impacted femoral neck hip fracture.   ANESTHESIA: General.   SURGEON: Thornton Park, MD.   ESTIMATED BLOOD LOSS: 25 mL.   COMPLICATIONS: None.   IMPLANTS: Synthes 7.3 mm cannulated screws x 3.   INDICATION FOR SURGERY: The patient is an 81 year old male who had a mechanical fall prior to admission. He landed on his left side and was diagnosed by x-ray with an impacted left femoral neck hip fracture. Given his independent ambulation status at baseline and high activity level I recommended surgical fixation for this fracture. I have reviewed the risks and benefits of surgery with the patient which included infection, bleeding, nerve or blood vessel injury, malunion, nonunion, leg length discrepancy, change in lower extremity rotation, persistent left hip pain, painful hardware or hardware failure and the need for further surgery. Medical risks include but are not limited to DVT and pulmonary embolism, myocardial infarction, stroke, pneumonia, respiratory failure, and death. The patient was cleared by the medical service for surgery. I have reviewed all laboratories and radiographic studies in preparation for this case.   PROCEDURE NOTE: The patient was met in the preoperative area. I signed the left hip according to the hospital's right site protocol. I answered all questions by the patient and his family who was at the bedside. He was then brought to the operating room where he underwent general anesthesia. He was then positioned on the fracture table. The left leg was placed in a legholder and the right leg was placed carefully in a hemi-lithotomy position taking care to treat the right hip gently given that there  was a hemiarthroplasty prosthesis there. The patient was then prepped and draped in a sterile fashion. A timeout was performed to verify the patient's name, date of birth, medical record number, correct site of surgery, and correct procedure to be performed. It was also used to verify the patient had received antibiotics and all appropriate instruments, implants, and radiographic studies were available in the room. Once all in attendance were in agreement the case began.   The patient did not require much traction as this was an impacted nondisplaced fracture.  C-arm images were taken at the onset of the case for incisional planning both in the AP and lateral planes. An incision approximately 3-4 cm in length was created over the lateral femur just distal to the greater trochanter. The fascia lata was incised with a deep number 10 blade. The percutaneous threaded guide pins were then advanced through the lateral cortex of the femur, across the fracture site, and into the femoral head. The positions were confirmed on AP and lateral C-arm images. An inverted triangle formation was used for these guide pins. Once adequate position of the pins had been achieved they were measured with a depth gauge. The inferiormost 7.3 mm cannulated short threaded screw was 115 mm in length and the posterior superior screw was 100 mm in length and the superior anterior screw was 95 mm in length. These were hand tightened with a screwdriver. The final position of the cannulated screw construct was taken with AP and lateral C-arm images. Excellent purchase was achieved with all 3 screws. The wound was then copiously irrigated. The vastus lateralis, the fascia, as well as the fascia  lata were closed with 0 interrupted Vicryl suture. The skin was then closed in 2 layers with a 2-0 Vicryl and the skin approximated with staples. A dry sterile dressing was applied. The patient was awoken and brought to the PACU in stable condition. I was  scrubbed and present for the entire case and all sharp and instrument counts were correct at the conclusion of the case.     ____________________________  L. , MD klk:bu D: 10/31/2014 17:01:13 ET T: 10/31/2014 21:23:46 ET JOB#: [id number]  cc:  L. , MD, <Dictator>  L  MD ELECTRONICALLY SIGNED 11/01/2014 18:09 

## 2015-03-20 ENCOUNTER — Ambulatory Visit
Admit: 2015-03-20 | Disposition: A | Discharge status: Home / Self Care | Payer: Self-pay | Attending: Orthopedic Surgery | Admitting: Orthopedic Surgery

## 2015-03-20 ENCOUNTER — Ambulatory Visit: Payer: Self-pay | Admitting: Cardiology

## 2015-03-20 ENCOUNTER — Telehealth: Payer: Self-pay | Admitting: Cardiology

## 2015-03-20 LAB — CBC
HCT: 31.4 % — ABNORMAL LOW (ref 40.0–52.0)
HGB: 10.3 g/dL — ABNORMAL LOW (ref 13.0–18.0)
MCH: 27.8 pg (ref 26.0–34.0)
MCHC: 32.8 g/dL (ref 32.0–36.0)
MCV: 85 fL (ref 80–100)
PLATELETS: 386 10*3/uL (ref 150–440)
RBC: 3.7 10*6/uL — ABNORMAL LOW (ref 4.40–5.90)
RDW: 16 % — AB (ref 11.5–14.5)
WBC: 8.8 10*3/uL (ref 3.8–10.6)

## 2015-03-20 LAB — BASIC METABOLIC PANEL
Anion Gap: 6 — ABNORMAL LOW (ref 7–16)
BUN: 21 mg/dL — ABNORMAL HIGH
CALCIUM: 8.9 mg/dL
CHLORIDE: 95 mmol/L — AB
Co2: 28 mmol/L
Creatinine: 0.82 mg/dL
EGFR (African American): >60
Glucose: 100 mg/dL — ABNORMAL HIGH
POTASSIUM: 4.7 mmol/L
Sodium: 129 mmol/L — ABNORMAL LOW

## 2015-03-20 LAB — URINALYSIS, COMPLETE
Bacteria: NONE SEEN
Blood: NEGATIVE
GLUCOSE, UR: NEGATIVE mg/dL (ref 0–75)
Leukocyte Esterase: NEGATIVE
Nitrite: NEGATIVE
Ph: 5 (ref 4.5–8.0)
Protein: 100
RBC, UR: NONE SEEN /HPF (ref 0–5)
Specific Gravity: 1.033 (ref 1.003–1.030)

## 2015-03-20 LAB — PROTIME-INR
INR: 1.2
Prothrombin Time: 15 secs

## 2015-03-20 LAB — MRSA PCR SCREENING

## 2015-03-20 LAB — APTT: Activated PTT: 36.6 secs — ABNORMAL HIGH (ref 23.6–35.9)

## 2015-03-20 LAB — SEDIMENTATION RATE: Erythrocyte Sed Rate: 34 mm/hr — ABNORMAL HIGH (ref 0–20)

## 2015-03-20 NOTE — Telephone Encounter (Signed)
New message    Patient need to check regarding his coumadin.

## 2015-03-20 NOTE — Telephone Encounter (Signed)
Copy Of clearance from Dr Myrtis SerKatz and Ilda BassetPharm D sent to Pre-Admit at [facility] Simona HuhEarl, Ilda BassetPharm D.

## 2015-03-21 ENCOUNTER — Inpatient Hospital Stay
Admit: 2015-03-21 | Disposition: A | Discharge status: Skilled Nursing Facility | Payer: Self-pay | Attending: Orthopedic Surgery | Admitting: Orthopedic Surgery

## 2015-03-21 ENCOUNTER — Ambulatory Visit: Admit: 2015-03-21 | Disposition: A | Discharge status: Home / Self Care | Payer: Self-pay | Admitting: Internal Medicine

## 2015-03-21 LAB — PROTIME-INR
INR: 1.1
Prothrombin Time: 13.9 secs

## 2015-03-21 LAB — APTT: Activated PTT: 38.3 secs — ABNORMAL HIGH (ref 23.6–35.9)

## 2015-03-22 LAB — PROTIME-INR
INR: 1.2
PROTHROMBIN TIME: 15.7 s — AB

## 2015-03-22 LAB — BASIC METABOLIC PANEL
Anion Gap: 5 — ABNORMAL LOW (ref 7–16)
BUN: 15 mg/dL
Calcium, Total: 8.1 mg/dL — ABNORMAL LOW
Chloride: 98 mmol/L — ABNORMAL LOW
Co2: 27 mmol/L
Creatinine: 0.73 mg/dL
EGFR (African American): >60
EGFR (Non-African Amer.): >60
Glucose: 108 mg/dL — ABNORMAL HIGH
Potassium: 4.1 mmol/L
SODIUM: 130 mmol/L — AB

## 2015-03-22 LAB — PLATELET COUNT: Platelet: 311 10*3/uL (ref 150–440)

## 2015-03-22 LAB — HEMOGLOBIN: HGB: 8.5 g/dL — ABNORMAL LOW (ref 13.0–18.0)

## 2015-03-23 LAB — BASIC METABOLIC PANEL
Anion Gap: 5 — ABNORMAL LOW (ref 7–16)
BUN: 19 mg/dL
CALCIUM: 7.8 mg/dL — AB
CO2: 28 mmol/L
Chloride: 96 mmol/L — ABNORMAL LOW
Creatinine: 0.72 mg/dL
EGFR (African American): >60
EGFR (Non-African Amer.): >60
Glucose: 118 mg/dL — ABNORMAL HIGH
Potassium: 3.7 mmol/L
Sodium: 129 mmol/L — ABNORMAL LOW

## 2015-03-23 LAB — PROTIME-INR
INR: 1.7
Prothrombin Time: 20.3 secs — ABNORMAL HIGH

## 2015-03-23 LAB — HEMOGLOBIN: HGB: 8.2 g/dL — ABNORMAL LOW (ref 13.0–18.0)

## 2015-03-24 LAB — SURGICAL PATHOLOGY

## 2015-03-24 LAB — PROTIME-INR
INR: 1.8
PROTHROMBIN TIME: 21 s — AB

## 2015-03-26 NOTE — Discharge Summary (Addendum)
PATIENT NAME:  [name], [name] A MR#:  [id number] DATE OF BIRTH:  12/16/1930  DATE OF ADMISSION:  03/21/2015 DATE OF DISCHARGE:  03/24/2015  ADMITTING DIAGNOSIS: Left hip avascular necrosis, post hip fracture.   DISCHARGE DIAGNOSIS: Left hip avascular necrosis, post hip fracture.   PROCEDURE: Removal of deep hardware and revision to total hip replacement.   ANESTHESIA: Spinal.   SURGEON: Leitha SchullerMichael J. Menz, MD   ESTIMATED BLOOD LOSS: 700 mL, 275 given back through Cell Saver.  IMPLANTS: Medacta size 6 AMIStem with an L28 mm head, 54 mm Versafitcup DM with liner.  SPECIMEN: Removed femoral head. The hardware was discarded after a picture obtained.   HISTORY OF PRESENT ILLNESS: The patient is unable to manage shoes and socks. He is unable to sit for one half hour. He can transfer from sitting to standing with use of arms. He can manage stairs by other methods. He is unable to pick up an object from the floor. He barely is able to walk. He can carry using a walker. He is not able to drive a car. He uses crutches, canes, or a walker.   PHYSICAL EXAMINATION:  GENERAL: No apparent distress, well nourished, well developed.  EYES: Pupils equal, round, and reactive light.  LYMPHATIC: No adenopathy.  CARDIOVASCULAR: Regular and palpable pulses.  HEART: Mechanical valve, murmur.  LUNGS: Clear.  HEENT: Normal.  RESPIRATORY: Clear and nonlabored breathing.  VASCULAR: No edema, swelling or tenderness, except as noted in detailed examination. NEUROLOGIC AND PSYCHIATRIC: Normal mood and affect. Oriented to person, place, and time.  MUSCULOSKELETAL: Normal as noted in detailed examination and HPI. Gait: Abnormal secondary to presenting problem above. Unable to walk. Back: Has full motion of the lumbar spine. Left hip: Examination of the left hip shows the patient has 90 degrees of hip flexion, 0 degrees of hip extension, 5 degrees of abduction and 10 degrees of adduction, 0 degrees of internal rotation,  and 10 degrees of external rotation. No signs of infection. Full range of motion of the knee, ankle. He is neurovascularly intact in the left lower extremity.   HOSPITAL COURSE: The patient was admitted to the hospital on 03/21/2015. He had surgery that same day and was brought to the orthopedic floor from the PACU in stable condition. On postoperative day 1, the patient had an acute postoperative blood loss anemia with a hemoglobin of 8.5. On postoperative day 2, hemoglobin was down to 8.2. Sodium was low at 130 and 129. This seems to be consistent with his baseline over the last few years. The patient's vital signs remained stable. He also progressed in physical therapy. By postoperative day 3, the patient was stable and ready for discharge to a rehabilitation facility to continue his physical therapy and occupational therapy.   CONDITION AT DISCHARGE: Stable.   DISCHARGE INSTRUCTIONS: The patient may increase weight-bearing on the affected extremity. Elevate the affected foot or leg on 1 or 2 pillows with the foot higher than the knee. Thigh-high TED hose on both legs and remove 1 hour per 8 hour shift. Elevate the heels off the bed. Incentive spirometer every hour while awake. Encourage cough and deep breathing. The patient may resume a regular diet as tolerated. Do not apply ice pack to the affected area. Do not get the dressing or bandage wet or dirty. [facility] if the dressing gets water under it. Leave the dressing on. [facility] if any of the following occur: Bright red bleeding from the incision  wound, fever above 101.5 degrees, redness, swelling, or drainage at the incision site. Call [facility] if you experience any increased leg pain, numbness or weakness in the legs or bowel or bladder symptoms. Call [facility] for followup appointment in 2 weeks. Followup physician Dr. Rosita Kea.   DISCHARGE MEDICATIONS: Please see list  of discharge medications on discharge instructions.    ____________________________ T. Cranston Neighbor, PA-C tcg:TM D: 03/23/2015 22:11:25 ET T: 03/23/2015 22:41:51 ET JOB#: [id number]  cc: T. Cranston Neighbor, PA-C, <Dictator> Evon Slack [place] ELECTRONICALLY SIGNED 03/31/2015 11:27

## 2015-03-26 NOTE — Op Note (Signed)
PATIENT NAME:  [name] QuailsSCHENK, Abednego A MR#:  [id number] DATE OF BIRTH:  09-08-1931  DATE OF PROCEDURE:  03/21/2015  PREOPERATIVE DIAGNOSIS: Left hip avascular necrosis post hip fracture.   POSTOPERATIVE DIAGNOSIS:   Left hip avascular necrosis post hip fracture.   PROCEDURE: Removal of deep hardware and revision to total hip replacement.   ANESTHESIA: Spinal.   SURGEON: Kennedy BuckerMichael Keondria Siever, MD  DESCRIPTION OF PROCEDURE: The patient was brought to the operating room and after adequate anesthesia was obtained, the patient was placed on the operative table with the left foot in the Medacta attachment and right leg on a well-padded table. Next,  appropriate patient identification and timeout procedures were completed. After prepping and draping in the usual sterile manner had been completed, the prior lateral incision was made and the soft tissue was split to allow for exposure of the lateral proximal femur.  One of the screws could be removed.   The  others were not easy to find even with C-arm and so the anterior approach was made to the hip so they could be pushed out from acetabulum after head removal.  The AMIS approach was utilized, incision down through the skin and subcutaneous tissue. The tensor fascia lata muscle was incised and the muscle retracted laterally. Deep fascia incised and the lateral femoral circumflex vessels ligated. The anterior capsule was exposed and a capsulotomy performed with moderate synovitis within the joint. Femoral neck cut was made in a stepwise fashion getting down to the screws, a window opened and the head was removed in pieces taking care not to damage the acetabulum or adjacent structures.  After the head had been removed, the other 2 screws were removed and a freshen up cut on the neck carried out.   The acetabulum was sequentially reamed to 54 mm, at which point there was good bleeding bone. A 54 mm trial fit well and the 54 mm Versafitcup DM  was impacted into place. The leg was  externally rotated and after pubofemoral and ischiofemoral releases, the leg could be dropped into extension. Sequential broaching was carried out and #6 gave good fill with an L neck length restoring neck leg length. The final components were assembled, inserted, and the hip reduced Betadine soak was utilized and x-rays comparing to initial x-ray appeared to show restoration of length with appropriate position of components. Both wounds were thoroughly irrigated.  The deep fascia was repaired using  heavy Quill. Both wounds were infiltrated with a total of 30 mL 0.25% Sensorcaine with epinephrine, #1 Vicryl for the lateral fascia, 2-0 Vicryl subcutaneously laterally, and skin staples with subcutaneous drain placed. The anterior incision also had subcutaneous drains placed followed by 2-0 Quill and staples. Xeroform, 4 x 4's, ABD, and tape were applied. The patient was sent to the recovery room in stable condition.   ESTIMATED BLOOD LOSS: 700 mL with 275 given back through the cell saver.   IMPLANTS: Medacta size 6 AMIS stem with an L 28 mm head, 54 mm Versafitcup DM  with liner.   SPECIMEN: Removed femoral head. The hardware was discarded after a picture obtained.    ____________________________ Leitha SchullerMichael J. Mujahid Jalomo, MD mjm:tr D: 03/21/2015 23:04:00 ET T: 03/22/2015 09:48:21 ET JOB#: [id number]  cc: Leitha SchullerMichael J. Jadore Mcguffin, MD, <Dictator> Leitha SchullerMICHAEL J Latice Waitman MD ELECTRONICALLY SIGNED 03/22/2015 19:41

## 2015-03-28 ENCOUNTER — Encounter
Admission: RE | Admit: 2015-03-28 | Discharge: 2015-03-28 | Disposition: A | Discharge status: Home / Self Care | Payer: Medicare Other | Source: Ambulatory Visit | Attending: Internal Medicine | Admitting: Internal Medicine

## 2015-03-28 DIAGNOSIS — Z7901 Long term (current) use of anticoagulants: Secondary | ICD-10-CM | POA: Diagnosis not present

## 2015-03-28 DIAGNOSIS — Z952 Presence of prosthetic heart valve: Secondary | ICD-10-CM | POA: Insufficient documentation

## 2015-03-28 DIAGNOSIS — Z5181 Encounter for therapeutic drug level monitoring: Secondary | ICD-10-CM | POA: Insufficient documentation

## 2015-03-28 DIAGNOSIS — R410 Disorientation, unspecified: Secondary | ICD-10-CM | POA: Insufficient documentation

## 2015-03-28 LAB — PROTIME-INR
INR: 2.32
Prothrombin Time: 25.6 seconds — ABNORMAL HIGH (ref 11.4–15.0)

## 2015-04-02 ENCOUNTER — Inpatient Hospital Stay
Admission: EM | Admit: 2015-04-02 | Discharge: 2015-04-13 | DRG: 812 | Disposition: A | Discharge status: Skilled Nursing Facility | Payer: Medicare Other | Attending: Internal Medicine | Admitting: Internal Medicine

## 2015-04-02 ENCOUNTER — Encounter: Payer: Self-pay | Admitting: Emergency Medicine

## 2015-04-02 DIAGNOSIS — R609 Edema, unspecified: Secondary | ICD-10-CM

## 2015-04-02 DIAGNOSIS — Z7901 Long term (current) use of anticoagulants: Secondary | ICD-10-CM | POA: Diagnosis not present

## 2015-04-02 DIAGNOSIS — Z952 Presence of prosthetic heart valve: Secondary | ICD-10-CM | POA: Diagnosis present

## 2015-04-02 DIAGNOSIS — R7881 Bacteremia: Secondary | ICD-10-CM | POA: Diagnosis not present

## 2015-04-02 DIAGNOSIS — D62 Acute posthemorrhagic anemia: Secondary | ICD-10-CM | POA: Diagnosis present

## 2015-04-02 DIAGNOSIS — Z7982 Long term (current) use of aspirin: Secondary | ICD-10-CM

## 2015-04-02 DIAGNOSIS — K2970 Gastritis, unspecified, without bleeding: Secondary | ICD-10-CM | POA: Diagnosis present

## 2015-04-02 DIAGNOSIS — H9191 Unspecified hearing loss, right ear: Secondary | ICD-10-CM | POA: Diagnosis present

## 2015-04-02 DIAGNOSIS — R509 Fever, unspecified: Secondary | ICD-10-CM

## 2015-04-02 DIAGNOSIS — K922 Gastrointestinal hemorrhage, unspecified: Secondary | ICD-10-CM | POA: Diagnosis present

## 2015-04-02 DIAGNOSIS — I2510 Atherosclerotic heart disease of native coronary artery without angina pectoris: Secondary | ICD-10-CM | POA: Diagnosis present

## 2015-04-02 DIAGNOSIS — B9561 Methicillin susceptible Staphylococcus aureus infection as the cause of diseases classified elsewhere: Secondary | ICD-10-CM | POA: Diagnosis not present

## 2015-04-02 DIAGNOSIS — D649 Anemia, unspecified: Secondary | ICD-10-CM

## 2015-04-02 DIAGNOSIS — Z96643 Presence of artificial hip joint, bilateral: Secondary | ICD-10-CM | POA: Diagnosis present

## 2015-04-02 DIAGNOSIS — K920 Hematemesis: Secondary | ICD-10-CM | POA: Diagnosis not present

## 2015-04-02 DIAGNOSIS — G2581 Restless legs syndrome: Secondary | ICD-10-CM | POA: Diagnosis present

## 2015-04-02 DIAGNOSIS — I10 Essential (primary) hypertension: Secondary | ICD-10-CM | POA: Diagnosis present

## 2015-04-02 DIAGNOSIS — K219 Gastro-esophageal reflux disease without esophagitis: Secondary | ICD-10-CM | POA: Diagnosis present

## 2015-04-02 DIAGNOSIS — K449 Diaphragmatic hernia without obstruction or gangrene: Secondary | ICD-10-CM | POA: Diagnosis present

## 2015-04-02 DIAGNOSIS — I340 Nonrheumatic mitral (valve) insufficiency: Secondary | ICD-10-CM | POA: Diagnosis not present

## 2015-04-02 DIAGNOSIS — Z5181 Encounter for therapeutic drug level monitoring: Secondary | ICD-10-CM | POA: Diagnosis not present

## 2015-04-02 DIAGNOSIS — Z8249 Family history of ischemic heart disease and other diseases of the circulatory system: Secondary | ICD-10-CM

## 2015-04-02 DIAGNOSIS — R410 Disorientation, unspecified: Secondary | ICD-10-CM | POA: Diagnosis not present

## 2015-04-02 DIAGNOSIS — D500 Iron deficiency anemia secondary to blood loss (chronic): Secondary | ICD-10-CM | POA: Diagnosis not present

## 2015-04-02 DIAGNOSIS — A047 Enterocolitis due to Clostridium difficile: Secondary | ICD-10-CM | POA: Diagnosis not present

## 2015-04-02 DIAGNOSIS — K921 Melena: Secondary | ICD-10-CM | POA: Diagnosis present

## 2015-04-02 DIAGNOSIS — R0602 Shortness of breath: Secondary | ICD-10-CM

## 2015-04-02 DIAGNOSIS — Z7189 Other specified counseling: Secondary | ICD-10-CM | POA: Diagnosis not present

## 2015-04-02 DIAGNOSIS — Z87891 Personal history of nicotine dependence: Secondary | ICD-10-CM | POA: Diagnosis not present

## 2015-04-02 DIAGNOSIS — Z954 Presence of other heart-valve replacement: Secondary | ICD-10-CM | POA: Diagnosis not present

## 2015-04-02 DIAGNOSIS — I82612 Acute embolism and thrombosis of superficial veins of left upper extremity: Secondary | ICD-10-CM | POA: Diagnosis not present

## 2015-04-02 DIAGNOSIS — I808 Phlebitis and thrombophlebitis of other sites: Secondary | ICD-10-CM

## 2015-04-02 DIAGNOSIS — Z452 Encounter for adjustment and management of vascular access device: Secondary | ICD-10-CM

## 2015-04-02 LAB — CBC WITH DIFFERENTIAL/PLATELET
Basophils Absolute: 0.1 10*3/uL (ref 0–0.1)
Basophils Relative: 0 %
EOS ABS: 0.2 10*3/uL (ref 0–0.7)
Eosinophils Relative: 2 %
HCT: 16.1 % — ABNORMAL LOW (ref 40.0–52.0)
Hemoglobin: 5.1 g/dL — ABNORMAL LOW (ref 13.0–18.0)
LYMPHS ABS: 1.7 10*3/uL (ref 1.0–3.6)
LYMPHS PCT: 13 %
MCH: 27 pg (ref 26.0–34.0)
MCHC: 31.7 g/dL — ABNORMAL LOW (ref 32.0–36.0)
MCV: 85.4 fL (ref 80.0–100.0)
MONO ABS: 0.8 10*3/uL (ref 0.2–1.0)
Monocytes Relative: 7 %
Neutro Abs: 9.9 10*3/uL — ABNORMAL HIGH (ref 1.4–6.5)
Neutrophils Relative %: 78 %
Platelets: 638 10*3/uL — ABNORMAL HIGH (ref 150–440)
RBC: 1.88 MIL/uL — ABNORMAL LOW (ref 4.40–5.90)
RDW: 17.2 % — ABNORMAL HIGH (ref 11.5–14.5)
WBC: 12.7 10*3/uL — ABNORMAL HIGH (ref 3.8–10.6)

## 2015-04-02 LAB — COMPREHENSIVE METABOLIC PANEL
ALT: 10 U/L — ABNORMAL LOW (ref 17–63)
ANION GAP: 6 (ref 5–15)
AST: 21 U/L (ref 15–41)
Albumin: 3 g/dL — ABNORMAL LOW (ref 3.5–5.0)
Alkaline Phosphatase: 96 U/L (ref 38–126)
BUN: 61 mg/dL — AB (ref 6–20)
CO2: 27 mmol/L (ref 22–32)
CREATININE: 0.98 mg/dL (ref 0.61–1.24)
Calcium: 8.2 mg/dL — ABNORMAL LOW (ref 8.9–10.3)
Chloride: 95 mmol/L — ABNORMAL LOW (ref 101–111)
GFR calc Af Amer: >60 mL/min (ref >60)
GFR calc non Af Amer: >60 mL/min (ref >60)
Glucose, Bld: 106 mg/dL — ABNORMAL HIGH (ref 65–99)
POTASSIUM: 4.5 mmol/L (ref 3.5–5.1)
SODIUM: 128 mmol/L — AB (ref 135–145)
Total Bilirubin: 0.5 mg/dL (ref 0.3–1.2)
Total Protein: 5.4 g/dL — ABNORMAL LOW (ref 6.5–8.1)

## 2015-04-02 LAB — URINALYSIS COMPLETE WITH MICROSCOPIC (ARMC ONLY)
BACTERIA UA: NONE SEEN
BILIRUBIN URINE: NEGATIVE
Glucose, UA: NEGATIVE mg/dL
Hgb urine dipstick: NEGATIVE
Ketones, ur: NEGATIVE mg/dL
Leukocytes, UA: NEGATIVE
Nitrite: NEGATIVE
Protein, ur: NEGATIVE mg/dL
Specific Gravity, Urine: 1.017 (ref 1.005–1.030)
pH: 5 (ref 5.0–8.0)

## 2015-04-02 LAB — PREPARE RBC (CROSSMATCH)

## 2015-04-02 LAB — APTT: aPTT: 40 seconds — ABNORMAL HIGH (ref 24–36)

## 2015-04-02 LAB — ABO/RH: ABO/RH(D): O POS

## 2015-04-02 LAB — PROTIME-INR
INR: 3.09
Prothrombin Time: 31.9 seconds — ABNORMAL HIGH (ref 11.4–15.0)

## 2015-04-02 MED ORDER — ACETAMINOPHEN 650 MG RE SUPP
650.0000 mg | Freq: Four times a day (QID) | RECTAL | Status: DC | PRN
  Filled 2015-04-02: qty 1

## 2015-04-02 MED ORDER — TRAZODONE HCL 50 MG PO TABS
50.0000 mg | ORAL_TABLET | Freq: Every day | ORAL | Status: DC
  Administered 2015-04-02 – 2015-04-12 (×11): 50 mg via ORAL
  Filled 2015-04-02 (×13): qty 1

## 2015-04-02 MED ORDER — CYCLOBENZAPRINE HCL 10 MG PO TABS
10.0000 mg | ORAL_TABLET | Freq: Three times a day (TID) | ORAL | Status: DC | PRN
  Administered 2015-04-02 – 2015-04-12 (×17): 10 mg via ORAL
  Filled 2015-04-02 (×20): qty 1

## 2015-04-02 MED ORDER — BISACODYL 10 MG RE SUPP
10.0000 mg | Freq: Every day | RECTAL | Status: DC | PRN
  Administered 2015-04-06 – 2015-04-07 (×2): 10 mg via RECTAL
  Filled 2015-04-02 (×3): qty 1

## 2015-04-02 MED ORDER — MORPHINE SULFATE 2 MG/ML IJ SOLN
INTRAMUSCULAR | Status: AC
  Administered 2015-04-02: 2 mg via INTRAVENOUS
  Filled 2015-04-02: qty 1

## 2015-04-02 MED ORDER — ALUMINUM & MAGNESIUM HYDROXIDE 200-200 MG/5ML PO SUSP: 30.0000 mL | Freq: Two times a day (BID) | ORAL | Status: DC | PRN

## 2015-04-02 MED ORDER — ROPINIROLE HCL 0.25 MG PO TABS
0.5000 mg | ORAL_TABLET | Freq: Once | ORAL | Status: AC
  Administered 2015-04-02: 0.5 mg via ORAL
  Filled 2015-04-02: qty 1

## 2015-04-02 MED ORDER — SODIUM CHLORIDE 0.9 % IJ SOLN
3.0000 mL | Freq: Two times a day (BID) | INTRAMUSCULAR | Status: DC
  Administered 2015-04-03 – 2015-04-07 (×8): 3 mL via INTRAVENOUS
  Administered 2015-04-08: 10 mL via INTRAVENOUS
  Administered 2015-04-10 – 2015-04-12 (×3): 3 mL via INTRAVENOUS

## 2015-04-02 MED ORDER — FERROUS FUMARATE-FOLIC ACID 324-1 MG PO TABS: ORAL_TABLET | Freq: Two times a day (BID) | ORAL | Status: DC

## 2015-04-02 MED ORDER — HYDROCODONE-ACETAMINOPHEN 5-325 MG PO TABS
1.0000 | ORAL_TABLET | ORAL | Status: DC | PRN
  Administered 2015-04-03: 1 via ORAL
  Administered 2015-04-03: 2 via ORAL
  Administered 2015-04-03 – 2015-04-06 (×7): 1 via ORAL
  Filled 2015-04-02 (×11): qty 1

## 2015-04-02 MED ORDER — DOCUSATE SODIUM 100 MG PO CAPS
100.0000 mg | ORAL_CAPSULE | Freq: Two times a day (BID) | ORAL | Status: DC | PRN
  Administered 2015-04-06: 100 mg via ORAL
  Filled 2015-04-02 (×2): qty 1

## 2015-04-02 MED ORDER — ROPINIROLE HCL 0.25 MG PO TABS
0.2500 mg | ORAL_TABLET | Freq: Every day | ORAL | Status: DC
  Administered 2015-04-03: 0.25 mg via ORAL
  Filled 2015-04-02: qty 1

## 2015-04-02 MED ORDER — PANTOPRAZOLE SODIUM 40 MG IV SOLR
40.0000 mg | Freq: Two times a day (BID) | INTRAVENOUS | Status: DC
  Administered 2015-04-06: 40 mg via INTRAVENOUS
  Filled 2015-04-02 (×2): qty 40

## 2015-04-02 MED ORDER — SODIUM CHLORIDE 0.9 % IV SOLN
INTRAVENOUS | Status: DC
  Administered 2015-04-03 (×2): via INTRAVENOUS

## 2015-04-02 MED ORDER — FINASTERIDE 5 MG PO TABS
5.0000 mg | ORAL_TABLET | Freq: Every day | ORAL | Status: DC
  Administered 2015-04-02 – 2015-04-13 (×12): 5 mg via ORAL
  Filled 2015-04-02 (×13): qty 1

## 2015-04-02 MED ORDER — SODIUM CHLORIDE 0.9 % IV SOLN: 10.0000 mL/h | Freq: Once | INTRAVENOUS | Status: AC

## 2015-04-02 MED ORDER — SODIUM CHLORIDE 0.9 % IV SOLN
1000.0000 mL | Freq: Once | INTRAVENOUS | Status: AC
  Administered 2015-04-02: 1000 mL via INTRAVENOUS

## 2015-04-02 MED ORDER — MORPHINE SULFATE 2 MG/ML IJ SOLN
2.0000 mg | INTRAMUSCULAR | Status: DC | PRN
  Administered 2015-04-06: 2 mg via INTRAVENOUS
  Filled 2015-04-02: qty 1

## 2015-04-02 MED ORDER — TAMSULOSIN HCL 0.4 MG PO CAPS
0.4000 mg | ORAL_CAPSULE | Freq: Every day | ORAL | Status: DC
  Administered 2015-04-02 – 2015-04-13 (×12): 0.4 mg via ORAL
  Filled 2015-04-02 (×12): qty 1

## 2015-04-02 MED ORDER — ADULT MULTIVITAMIN W/MINERALS CH
1.0000 | ORAL_TABLET | Freq: Every day | ORAL | Status: DC
  Administered 2015-04-02 – 2015-04-13 (×10): 1 via ORAL
  Filled 2015-04-02 (×17): qty 1

## 2015-04-02 MED ORDER — SENNA 8.6 MG PO TABS
1.0000 | ORAL_TABLET | Freq: Every day | ORAL | Status: DC | PRN
  Administered 2015-04-06: 8.6 mg via ORAL
  Filled 2015-04-02 (×2): qty 1

## 2015-04-02 MED ORDER — ONDANSETRON HCL 4 MG/2ML IJ SOLN
4.0000 mg | Freq: Four times a day (QID) | INTRAMUSCULAR | Status: DC | PRN
  Administered 2015-04-08 – 2015-04-10 (×4): 4 mg via INTRAVENOUS
  Filled 2015-04-02 (×4): qty 2

## 2015-04-02 MED ORDER — SODIUM CHLORIDE 0.9 % IV SOLN
8.0000 mg/h | INTRAVENOUS | Status: DC
  Administered 2015-04-03 – 2015-04-05 (×6): 8 mg/h via INTRAVENOUS
  Filled 2015-04-02 (×6): qty 80

## 2015-04-02 MED ORDER — ALBUTEROL SULFATE (2.5 MG/3ML) 0.083% IN NEBU
2.5000 mg | INHALATION_SOLUTION | RESPIRATORY_TRACT | Status: DC | PRN
  Filled 2015-04-02: qty 3

## 2015-04-02 MED ORDER — ACETAMINOPHEN 325 MG PO TABS
650.0000 mg | ORAL_TABLET | Freq: Four times a day (QID) | ORAL | Status: DC | PRN
Start: 2015-04-02 — End: 2015-04-13
  Administered 2015-04-09 – 2015-04-13 (×3): 650 mg via ORAL
  Filled 2015-04-02 (×4): qty 2

## 2015-04-02 MED ORDER — ALPRAZOLAM 0.25 MG PO TABS
0.1250 mg | ORAL_TABLET | Freq: Every day | ORAL | Status: DC | PRN
  Administered 2015-04-04: 0.25 mg via ORAL
  Administered 2015-04-08 – 2015-04-11 (×4): 0.125 mg via ORAL
  Filled 2015-04-02 (×6): qty 1

## 2015-04-02 MED ORDER — SODIUM CHLORIDE 0.9 % IV SOLN
80.0000 mg | Freq: Once | INTRAVENOUS | Status: AC
  Administered 2015-04-02: 80 mg via INTRAVENOUS
  Filled 2015-04-02 (×2): qty 80

## 2015-04-02 MED ORDER — FE FUMARATE-B12-VIT C-FA-IFC PO CAPS
1.0000 | ORAL_CAPSULE | Freq: Two times a day (BID) | ORAL | Status: DC
  Administered 2015-04-03 – 2015-04-05 (×5): 1 via ORAL
  Filled 2015-04-02 (×7): qty 1

## 2015-04-02 MED ORDER — CITALOPRAM HYDROBROMIDE 20 MG PO TABS
20.0000 mg | ORAL_TABLET | Freq: Every day | ORAL | Status: DC
  Administered 2015-04-02 – 2015-04-13 (×12): 20 mg via ORAL
  Filled 2015-04-02 (×12): qty 1

## 2015-04-02 NOTE — ED Provider Notes (Signed)
[facility] Emergency Department Provider Note  ____________________________________________  Time seen: 4:10 PM  I have reviewed the triage vital signs and the nursing notes.   HISTORY  Chief Complaint Weakness      HPI [name] is a 81 y.o. male who presents with generalized weakness and low energy. Patient is in a rehabilitation facility status post left total hip replacement. He is on Coumadin for an artificial aortic valve. He has been doing well at rehabilitationbut recently has noted dark stools and decreased energy. He attributed the dark stools to iron tablets. He denies chest pain he denies shortness of breath. No fevers chills cough nausea or vomiting. No abdominal pain. He does feel lightheaded when he stands     Past Medical History  Diagnosis Date  . GERD (gastroesophageal reflux disease)   . Bradycardia     chronic, no symptoms 07/2010  . Depression     and Anxiety,  08/2010  . Anxiety 10/11  . SOB (shortness of breath) 10/11    08/2010,Episodes at 5 AM, eventually felt to be anxiety, after complete workup including catheterization, pt greatly improved with anxiety meds 11/11  . Hypertension     BP higher than usual 04/19/10; amlodipine increased by telephone  . Decreased hearing     Right ear  . Coronary artery disease     mild, cath, 08/2010  . Aortic stenosis     AVR, 2002, Mechanical  . S/P AVR     St. Jude. mechanical 2002, echo 08/2010 working well  . Warfarin anticoagulation     mechanical AVR  . Ejection fraction     EF 60%,  echo. 08/2010  . Carotid bruit     dopplers in past, no abnormalities    Patient Active Problem List   Diagnosis Date Noted  . GERD (gastroesophageal reflux disease)   . Bradycardia   . Depression   . Anxiety   . SOB (shortness of breath)   . Hypertension   . Decreased hearing   . Coronary artery disease   . Aortic stenosis   . S/P AVR   . Warfarin anticoagulation   .  Ejection fraction   . Carotid bruit     Past Surgical History  Procedure Laterality Date  . Valve replacement  1/02    Aortic; echo 3/09 valve working well; echo 10/11 working well; put on Coumadin  . Cardiac catheterization      Current Outpatient Rx  Name  Route  Sig  Dispense  Refill  . acetaminophen (TYLENOL) 500 MG tablet   Oral   Take 500 mg by mouth every 4 (four) hours as needed.         . ALPRAZolam (XANAX) 0.5 MG tablet   Oral   Take 0.125 mg by mouth as needed.           Marland Kitchen aluminum-magnesium hydroxide 200-200 MG/5ML suspension   Oral   Take 30 mLs by mouth 2 (two) times daily as needed for indigestion.         Marland Kitchen amLODipine (NORVASC) 10 MG tablet      Take 1 tablet by mouth  daily   90 tablet   0   . aspirin 81 MG tablet   Oral   Take 81 mg by mouth daily.           . bisacodyl (DULCOLAX) 10 MG suppository   Rectal   Place 10 mg rectally daily as needed for moderate constipation.         Marland Kitchen  celecoxib (CELEBREX) 100 MG capsule   Oral   Take 100 mg by mouth as needed.           . citalopram (CELEXA) 20 MG tablet   Oral   Take 20 mg by mouth daily.         . cyclobenzaprine (FLEXERIL) 10 MG tablet   Oral   Take 10 mg by mouth 3 (three) times daily as needed for muscle spasms.         Di Kindle. FERROUS FUMARATE-FOLIC ACID PO   Oral   Take 1 capsule by mouth 2 (two) times daily with a meal.         . finasteride (PROSCAR) 5 MG tablet   Oral   Take 5 mg by mouth daily.           Marland Kitchen. FLUoxetine (PROZAC) 20 MG capsule   Oral   Take 30 mg by mouth daily.          Marland Kitchen. glycerin adult 2 G SUPP   Rectal   Place 1 suppository rectally once as needed for moderate constipation.         . Multiple Vitamin (MULTIVITAMIN) tablet   Oral   Take 1 tablet by mouth daily.           Marland Kitchen. oxyCODONE (OXY IR/ROXICODONE) 5 MG immediate release tablet   Oral   Take 5 mg by mouth every 4 (four) hours as needed for severe pain.         Marland Kitchen. rOPINIRole  (REQUIP) 0.25 MG tablet   Oral   Take 0.25 mg by mouth at bedtime.         . Tamsulosin HCl (FLOMAX) 0.4 MG CAPS   Oral   Take 0.4 mg by mouth daily.           . temazepam (RESTORIL) 15 MG capsule   Oral   Take 15 mg by mouth at bedtime as needed.         . traMADol (ULTRAM) 50 MG tablet   Oral   Take 50 mg by mouth every 4 (four) hours as needed.         . traZODone (DESYREL) 50 MG tablet   Oral   Take 50 mg by mouth at bedtime.         . valsartan (DIOVAN) 320 MG tablet   Oral   Take 320 mg by mouth daily.           Marland Kitchen. warfarin (COUMADIN) 5 MG tablet      Take as directed by  Anticoagulation clinic   180 tablet   1     This is 3 months supply     Allergies Sulfa antibiotics  No family history on file.  Social History History  Substance Use Topics  . Smoking status: Former Games developermoker  . Smokeless tobacco: Not on file  . Alcohol Use: 0.0 oz/week    0 Standard drinks or equivalent per week     Comment: Social    Review of Systems  Constitutional: Negative for fever. Eyes: Negative for visual changes. ENT: Negative for sore throat. Cardiovascular: Negative for chest pain. Respiratory: Negative for shortness of breath. Gastrointestinal: Negative for abdominal pain, vomiting and diarrhea. Genitourinary: Negative for dysuria. Musculoskeletal: Negative for back pain. Skin: Negative for rash. Positive for pallor Neurological: Negative for headaches, focal weakness or numbness. Psychiatric: No anxiety  10-point ROS otherwise negative.  ____________________________________________   PHYSICAL EXAM:  BP 94/48 mmHg  Pulse 91  Temp(Src) 98.2 F (36.8 C) (Oral)  Resp 16  Ht 5\' 10"  (1.778 m)  Wt 190 lb (86.183 kg)  BMI 27.26 kg/m2  SpO2 100%     Constitutional: Alert and oriented. Well appearing and in no distress. Eyes: Conjunctivae are pale. PERRL. Normal extraocular movements. ENT   Head: Normocephalic and atraumatic.   Nose: No  congestion/rhinnorhea.   Mouth/Throat: Mucous membranes are moist.   Neck: No stridor. Hematological/Lymphatic/Immunilogical: No cervical lymphadenopathy. Cardiovascular: Normal rate, regular rhythm. Normal and symmetric distal pulses are present in all extremities. Positive aortic murmur from artificial valve. Respiratory: Normal respiratory effort without tachypnea nor retractions. Breath sounds are clear and equal bilaterally. No wheezes/rales/rhonchi. Gastrointestinal: Soft and nontender. No distention. There is no CVA tenderness. Guaiac positive black stool Genitourinary: deferred Musculoskeletal: Nontender with normal range of motion in all extremities. No joint effusions.  No lower extremity tenderness nor edema. Neurologic:  Normal speech and language. No gross focal neurologic deficits are appreciated. Speech is normal.  Skin:  Skin is warm, dry and intact. No rash noted. Patient is pale Psychiatric: Mood and affect are normal. Speech and behavior are normal. Patient exhibits appropriate insight and judgment.  ____________________________________________    LABS (pertinent positives/negatives)  Critical hemoglobin of 5.1 noted.  ____________________________________________   EKG   Date: 04/02/2015  Rate: 92  Rhythm: normal sinus rhythm  QRS Axis: normal  Intervals: normal  ST/T Wave abnormalities: normal  Conduction Disutrbances: none  Narrative Interpretation: unremarkable      ____________________________________________    RADIOLOGY  None  ____________________________________________   PROCEDURES  Procedure(s) performed: yes  At 5:30 PM I inserted an 18-gauge peripheral IV in the left brachial vein under ultrasound guidance. Patient tolerated well.  Critical Care performed: Yes CRITICAL CARE Performed by: Jene EveryKINNER, Daron Breeding   Total critical care time: 30 minutes  Critical care time was exclusive of separately billable procedures and treating  other patients.  Critical care was necessary to treat or prevent imminent or life-threatening deterioration.  Critical care was time spent personally by me on the following activities: development of treatment plan with patient and/or surrogate as well as nursing, discussions with consultants, evaluation of patient's response to treatment, examination of patient, obtaining history from patient or surrogate, ordering and performing treatments and interventions, ordering and review of laboratory studies, ordering and review of radiographic studies, pulse oximetry and re-evaluation of patient's condition.    ____________________________________________   INITIAL IMPRESSION / ASSESSMENT AND PLAN / ED COURSE  Pertinent labs & imaging results that were available during my care of the patient were reviewed by me and considered in my medical decision making (see chart for details).  Initial impression is suspected lower GI bleed given Coumadin and pallor and weakness.  ----------------------------------------- 5:33 PM on 04/02/2015 -----------------------------------------  notified of hemoglobin. Patient consented, will order 2 units PRBC and admit.  ____________________________________________   FINAL CLINICAL IMPRESSION(S) / ED DIAGNOSES  Final diagnoses:  Gastrointestinal hemorrhage, unspecified gastritis, unspecified gastrointestinal hemorrhage type     Jene Everyobert Brandolyn Shortridge, MD 04/02/15 1735

## 2015-04-02 NOTE — H&P (Signed)
[facility] [place] at [facility]   PATIENT NAME: [name]    MR#:  [id number]  DATE OF BIRTH:  [birth year]  DATE OF ADMISSION:  04/02/2015  PRIMARY CARE PHYSICIAN: [name], MD   REQUESTING/REFERRING PHYSICIAN: Dr. Cyril Loosen  CHIEF COMPLAINT:   Chief Complaint  Patient presents with  . Weakness    HISTORY OF PRESENT ILLNESS:  [name]  is a 81 y.o. male with a known history of hypertension, restless leg syndrome, mechanical aortic valve replacement surgery, osteoarthritis requiring bilateral hip replacement surgeries and a recent left total hip replacement surgery on 03/21/2015. He is currently at [facility] rehabilitation and was brought in secondary to weakness that started today. Patient's hemoglobin after his surgery at the time of discharge 2 weeks ago was around 8. He was started on iron supplementation tablets and was discharged. He says his stools have turned dark since the iron pills were started. He denies any active bleeding. No nausea vomiting or hematemesis. He has been working with physical therapy using a walker and has been doing fine. His he noticed that he was extremely weak today and couldn't even get out of his bed. And his blood pressure at the rehabilitation systolic was in the 80s. So he was sent to the hospital. In the emergency room his labs indicate that he has a hemoglobin of 5. Patient is on Coumadin for his mechanical aortic valve and his INR today is at 3 which is his goal INR. So patient is being admitted for acute anemia. 2 units of packed RBC has already been ordered in the emergency room.  PAST MEDICAL HISTORY:   Past Medical History  Diagnosis Date  . GERD (gastroesophageal reflux disease)   . Bradycardia     chronic, no symptoms 07/2010  . Depression     and Anxiety,  08/2010  . Anxiety 10/11  . SOB (shortness of breath) 10/11    08/2010,Episodes at 5 AM, eventually felt to be anxiety, after complete workup  including catheterization, pt greatly improved with anxiety meds 11/11  . Hypertension     BP higher than usual 04/19/10; amlodipine increased by telephone  . Decreased hearing     Right ear  . Coronary artery disease     mild, cath, 08/2010  . Aortic stenosis     AVR, 2002, Mechanical  . S/P AVR     St. Jude. mechanical 2002, echo 08/2010 working well  . Warfarin anticoagulation     mechanical AVR  . Ejection fraction     EF 60%,  echo. 08/2010  . Carotid bruit     dopplers in past, no abnormalities    PAST SURGICAL HISTORY:   Past Surgical History  Procedure Laterality Date  . Valve replacement  1/02    Aortic; echo 3/09 valve working well; echo 10/11 working well; put on Coumadin  . Cardiac catheterization    . Joint replacement    . Hernia repair      SOCIAL HISTORY:   History  Substance Use Topics  . Smoking status: Former Games developer  . Smokeless tobacco: Not on file  . Alcohol Use: 1.2 oz/week    0 Standard drinks or equivalent, 2 Glasses of wine per week     Comment: Social    FAMILY HISTORY:  No family history on file.  DRUG ALLERGIES:   Allergies  Allergen Reactions  . Sulfa Antibiotics     unknown    REVIEW OF SYSTEMS:  Review of Systems  Constitutional: Positive for malaise/fatigue. Negative for fever, chills, weight loss and diaphoresis.  HENT: Negative for ear discharge, ear pain, hearing loss and tinnitus.   Eyes: Positive for blurred vision. Negative for double vision, photophobia, pain and discharge.  Respiratory: Negative for cough, hemoptysis, sputum production, shortness of breath and wheezing.   Cardiovascular: Negative for chest pain, palpitations, orthopnea and leg swelling.  Gastrointestinal: Positive for blood in stool. Negative for heartburn, nausea, vomiting, abdominal pain, diarrhea and constipation.  Genitourinary: Negative for dysuria, urgency and frequency.  Musculoskeletal: Positive for myalgias and joint pain.  Skin: Negative  for rash.  Neurological: Positive for weakness. Negative for dizziness, tingling, tremors, sensory change, speech change and headaches.  Endo/Heme/Allergies: Does not bruise/bleed easily.  Psychiatric/Behavioral: Negative for depression.    MEDICATIONS AT HOME:   Prior to Admission medications   Medication Sig Start Date End Date Taking? Authorizing Provider  acetaminophen (TYLENOL) 500 MG tablet Take 500 mg by mouth every 4 (four) hours as needed for mild pain.    Yes Historical Provider, MD  ALPRAZolam Prudy Feeler(XANAX) 0.5 MG tablet Take 0.125 mg by mouth daily as needed for anxiety.    Yes Historical Provider, MD  aluminum-magnesium hydroxide 200-200 MG/5ML suspension Take 30 mLs by mouth 2 (two) times daily as needed for indigestion.   Yes Historical Provider, MD  amLODipine (NORVASC) 10 MG tablet Take 1 tablet by mouth  daily 01/20/15  Yes Luis AbedJeffrey D Katz, MD  aspirin 81 MG tablet Take 81 mg by mouth daily.     Yes Historical Provider, MD  bisacodyl (DULCOLAX) 10 MG suppository Place 10 mg rectally daily as needed for moderate constipation.   Yes Historical Provider, MD  citalopram (CELEXA) 20 MG tablet Take 20 mg by mouth daily.   Yes Historical Provider, MD  cyclobenzaprine (FLEXERIL) 10 MG tablet Take 10 mg by mouth 3 (three) times daily as needed for muscle spasms.   Yes Historical Provider, MD  docusate sodium (COLACE) 100 MG capsule Take 100 mg by mouth daily as needed for mild constipation.   Yes Historical Provider, MD  FERROUS FUMARATE-FOLIC ACID PO Take 1 capsule by mouth 2 (two) times daily with a meal.   Yes Historical Provider, MD  finasteride (PROSCAR) 5 MG tablet Take 5 mg by mouth daily.     Yes Historical Provider, MD  glycerin adult 2 G SUPP Place 1 suppository rectally once as needed for moderate constipation.   Yes Historical Provider, MD  Multiple Vitamin (MULTIVITAMIN) tablet Take 1 tablet by mouth daily.     Yes Historical Provider, MD  oxyCODONE (OXY IR/ROXICODONE) 5 MG  immediate release tablet Take 5 mg by mouth every 4 (four) hours as needed for severe pain.   Yes Historical Provider, MD  rOPINIRole (REQUIP) 0.25 MG tablet Take 0.25 mg by mouth at bedtime.   Yes Historical Provider, MD  Tamsulosin HCl (FLOMAX) 0.4 MG CAPS Take 0.4 mg by mouth daily.     Yes Historical Provider, MD  traZODone (DESYREL) 50 MG tablet Take 50 mg by mouth at bedtime.   Yes Historical Provider, MD  valsartan (DIOVAN) 320 MG tablet Take 320 mg by mouth daily.     Yes Historical Provider, MD  warfarin (COUMADIN) 5 MG tablet Take 5 mg by mouth See admin instructions. Take only on thursdays. Patient not really sure how he is taking coumadin since last week. He states the doctor did change it.   Yes Historical Provider, MD  warfarin (COUMADIN)  7.5 MG tablet Take 7.5 mg by mouth daily. Patient states he was taking 7.5mg  tablet every day except on Thursdays take 5mg  tablet   Yes Historical Provider, MD  celecoxib (CELEBREX) 100 MG capsule Take 100 mg by mouth daily.     Historical Provider, MD  warfarin (COUMADIN) 5 MG tablet Take as directed by  Anticoagulation clinic Patient not taking: Reported on 04/02/2015 03/11/14   Luis AbedJeffrey D Katz, MD      VITAL SIGNS:  Blood pressure 120/61, pulse 87, temperature 98.2 F (36.8 C), temperature source Oral, resp. rate 16, height 5\' 10"  (1.778 m), weight 86.183 kg (190 lb), SpO2 98 %.  PHYSICAL EXAMINATION:   Physical Exam  GENERAL:  81 y.o.-year-old patient lying in the bed with no acute distress. Pale appearing EYES: Pupils equal, round, reactive to light and accommodation. No scleral icterus. Extraocular muscles intact.  HEENT: Head atraumatic, normocephalic. Oropharynx and nasopharynx clear.  NECK:  Supple, no jugular venous distention. No thyroid enlargement, no tenderness.  LUNGS: Normal breath sounds bilaterally, no wheezing, rales,rhonchi or crepitation. No use of accessory muscles of respiration.  CARDIOVASCULAR: S1, S2 normal. No murmurs,  rubs, or gallops.  ABDOMEN: Soft, nontender, nondistended. Bowel sounds present. No organomegaly or mass.  EXTREMITIES: No pedal edema, cyanosis, or clubbing. Left hip pain on flexing. No focal erythema NEUROLOGIC: Cranial nerves II through XII are intact. Muscle strength 5/5 in all extremities. Sensation intact. Gait not checked.  PSYCHIATRIC: The patient is alert and oriented x 3.  SKIN: No obvious rash, lesion, or ulcer.   LABORATORY PANEL:   CBC  Recent Labs Lab 04/02/15 1700  WBC 12.7*  HGB 5.1*  HCT 16.1*  PLT 638*   ------------------------------------------------------------------------------------------------------------------  Chemistries   Recent Labs Lab 04/02/15 1700  NA 128*  K 4.5  CL 95*  CO2 27  GLUCOSE 106*  BUN 61*  CREATININE 0.98  CALCIUM 8.2*  AST 21  ALT 10*  ALKPHOS 96  BILITOT 0.5   ------------------------------------------------------------------------------------------------------------------  Cardiac Enzymes No results for input(s): TROPONINI in the last 168 hours. ------------------------------------------------------------------------------------------------------------------  RADIOLOGY:  No results found.  EKG:   Orders placed or performed during the hospital encounter of 04/02/15  . ED EKG  . ED EKG    IMPRESSION AND PLAN:   [name]  is a 81 y.o. male with a known history of hypertension, restless leg syndrome, mechanical aortic valve replacement surgery, osteoarthritis requiring bilateral hip replacement surgeries and a recent left total hip replacement surgery on 03/21/2015. [facility] is [facility] and was brought in secondary to weakness and noted to be anemic  * Acute on chronic anemia-baseline hemoglobin is around 13, hemoglobin 2 weeks ago at the time of discharge after his total hip replacement surgery was around 8. He was started on iron pills. No active bleeding noted. Hemoglobin now is down  to 5. No other source of bleeding identified at this time. Hemoccult is positive. Send stool for occult blood. 2 units of packed RBC has been ordered. Hemoglobin checked every 8 hours 3. GI has been consulted. INR is 3 but due to his mechanical valve at this time will not give FFP. But continue to hold Coumadin. IV protonix drip ordered. Start on a liquid diet at this time.  * HTN-blood pressure now has improved. But we will hold his valsartan and Norvasc because he was hypotensive on presentation. Continue to monitor.  * Mechanical aortic valve replacement-with his GI bleed and anemia, hold Coumadin at this  time. Discussed with Dr. Shirlee Latch who was on call for lab our cardiology tonight. Cardiology has been consulted.  * Restless leg syndrome-continue ropinirole.  * DVT prophylaxis- TEDS and SCDs    All the records are reviewed and case discussed with ED provider. Management plans discussed with the patient, family and they are in agreement.  CODE STATUS: Full code  TOTAL TIME TAKING CARE OF THIS PATIENT: 60 minutes.    Enid Baas M.D on 04/02/2015 at 6:32 PM  Between 7am to 6pm - Pager - [phone]  After 6pm go to [email or web address] - password [email or web address] [facility]  [place] [facility]  Office  [phone]  CC: Primary care physician; [name], MD

## 2015-04-02 NOTE — ED Notes (Signed)
Pt via ems from [facility]; pt had ttl hip replacement on 4/26; has been recovering ok but awakened yesterday feeling weak. EMS states he was very pale and weak at their assessment. Pt is alert & oriented with warm, dry skin.

## 2015-04-03 DIAGNOSIS — Z952 Presence of prosthetic heart valve: Secondary | ICD-10-CM | POA: Diagnosis not present

## 2015-04-03 LAB — BASIC METABOLIC PANEL
ANION GAP: 5 (ref 5–15)
BUN: 46 mg/dL — AB (ref 6–20)
CHLORIDE: 100 mmol/L — AB (ref 101–111)
CO2: 25 mmol/L (ref 22–32)
Calcium: 7.8 mg/dL — ABNORMAL LOW (ref 8.9–10.3)
Creatinine, Ser: 0.75 mg/dL (ref 0.61–1.24)
GFR calc Af Amer: >60 mL/min (ref >60)
GFR calc non Af Amer: >60 mL/min (ref >60)
Glucose, Bld: 120 mg/dL — ABNORMAL HIGH (ref 65–99)
POTASSIUM: 4.4 mmol/L (ref 3.5–5.1)
Sodium: 130 mmol/L — ABNORMAL LOW (ref 135–145)

## 2015-04-03 LAB — CBC
HEMATOCRIT: 21.6 % — AB (ref 40.0–52.0)
Hemoglobin: 7 g/dL — ABNORMAL LOW (ref 13.0–18.0)
MCH: 27 pg (ref 26.0–34.0)
MCHC: 32.3 g/dL (ref 32.0–36.0)
MCV: 83.6 fL (ref 80.0–100.0)
Platelets: 523 10*3/uL — ABNORMAL HIGH (ref 150–440)
RBC: 2.59 MIL/uL — ABNORMAL LOW (ref 4.40–5.90)
RDW: 17.8 % — ABNORMAL HIGH (ref 11.5–14.5)
WBC: 11.1 10*3/uL — AB (ref 3.8–10.6)

## 2015-04-03 LAB — HEMATOCRIT: HCT: 22.1 % — ABNORMAL LOW (ref 40.0–52.0)

## 2015-04-03 LAB — HEMOGLOBIN: HEMOGLOBIN: 7.2 g/dL — AB (ref 13.0–18.0)

## 2015-04-03 LAB — PROTIME-INR
INR: 2.89
Prothrombin Time: 30.3 seconds — ABNORMAL HIGH (ref 11.4–15.0)

## 2015-04-03 LAB — GLUCOSE, CAPILLARY: Glucose-Capillary: 117 mg/dL — ABNORMAL HIGH (ref 70–99)

## 2015-04-03 LAB — PREPARE RBC (CROSSMATCH)

## 2015-04-03 MED ORDER — FUROSEMIDE 10 MG/ML IJ SOLN
20.0000 mg | Freq: Once | INTRAMUSCULAR | Status: AC
  Administered 2015-04-03: 20 mg via INTRAVENOUS
  Filled 2015-04-03: qty 2

## 2015-04-03 MED ORDER — SODIUM CHLORIDE 0.9 % IV SOLN
Freq: Once | INTRAVENOUS | Status: AC
  Administered 2015-04-03: 20:00:00 via INTRAVENOUS

## 2015-04-03 MED ORDER — ROPINIROLE HCL 0.25 MG PO TABS
0.7500 mg | ORAL_TABLET | Freq: Every day | ORAL | Status: DC
  Administered 2015-04-03: 0.75 mg via ORAL
  Filled 2015-04-03: qty 3

## 2015-04-03 MED ORDER — IRBESARTAN 75 MG PO TABS
75.0000 mg | ORAL_TABLET | Freq: Every day | ORAL | Status: DC
  Administered 2015-04-03 – 2015-04-13 (×11): 75 mg via ORAL
  Filled 2015-04-03 (×12): qty 1

## 2015-04-03 MED ORDER — VITAMIN K1 10 MG/ML IJ SOLN
5.0000 mg | Freq: Once | INTRAMUSCULAR | Status: AC
  Administered 2015-04-03: 5 mg via SUBCUTANEOUS
  Filled 2015-04-03: qty 1

## 2015-04-03 MED ORDER — VITAMIN K1 10 MG/ML IJ SOLN: 1.0000 mg | Freq: Once | INTRAMUSCULAR | Status: DC

## 2015-04-03 MED ORDER — AMLODIPINE BESYLATE 10 MG PO TABS
10.0000 mg | ORAL_TABLET | Freq: Every day | ORAL | Status: DC
  Administered 2015-04-03 – 2015-04-13 (×9): 10 mg via ORAL
  Filled 2015-04-03 (×10): qty 1

## 2015-04-03 MED ORDER — ROPINIROLE HCL 0.25 MG PO TABS
0.5000 mg | ORAL_TABLET | Freq: Once | ORAL | Status: AC | PRN
Start: 2015-04-03 — End: 2015-04-03
  Administered 2015-04-03: 0.5 mg via ORAL
  Filled 2015-04-03: qty 2

## 2015-04-03 MED ORDER — ACETAMINOPHEN 325 MG PO TABS
650.0000 mg | ORAL_TABLET | Freq: Once | ORAL | Status: AC
  Administered 2015-04-03: 650 mg via ORAL
  Filled 2015-04-03: qty 2

## 2015-04-03 MED ORDER — PIPERACILLIN-TAZOBACTAM 3.375 G IVPB 30 MIN: 3.3750 g | Freq: Once | INTRAVENOUS | Status: DC

## 2015-04-03 NOTE — Care Management (Cosign Needed)
[facility] post [facility]opedic surgery near the end of April. [facility] with anemia. His daughter is a bedside. PT evaluation needed when patient is medically cleared.Fredric MareBailey CSW updated on patient status.

## 2015-04-03 NOTE — Plan of Care (Signed)
Problem: Phase I Progression Outcomes Goal: OOB as tolerated unless otherwise ordered Outcome: Progressing With assist x1 (rolling walker )

## 2015-04-03 NOTE — Progress Notes (Signed)
Spoke to NP - Cala BradfordKimberly regarding patient EGD for Tomorrow, states the procedure is cancel due to elevated INR. Order was placed to cancel Consent. Consent removed from chart and shredded.

## 2015-04-03 NOTE — Progress Notes (Signed)
Dr.Gouru here to see patient, daughter at the bedside, discussing plan of care , lab here to draw stat labs cbc/pt/inr per MD order.

## 2015-04-03 NOTE — Progress Notes (Signed)
[facility] [place] [facility]   PATIENT NAME: [name]    MR#:  [id number]  DATE OF BIRTH:  1931/09/04  SUBJECTIVE:  CHIEF COMPLAINT:  [name] PacificWeaknesss No active bleeding per pts report, denies any chest pain or dizziness  REVIEW OF SYSTEMS:  CONSTITUTIONAL: No fever, fatigue, has weakness.  EYES: No blurred or double vision.  EARS, NOSE, AND THROAT: No tinnitus or ear pain.  RESPIRATORY: No cough, shortness of breath, wheezing or hemoptysis.  CARDIOVASCULAR: No chest pain, orthopnea, edema.  GASTROINTESTINAL: No nausea, vomiting, diarrhea or abdominal pain.  GENITOURINARY: No dysuria, hematuria.  ENDOCRINE: No polyuria, nocturia,  HEMATOLOGY: No anemia, easy bruising or bleeding SKIN: No rash or lesion. MUSCULOSKELETAL: No joint pain or arthritis.   NEUROLOGIC: No tingling, numbness, weakness.  PSYCHIATRY: No anxiety or depression.   DRUG ALLERGIES:   Allergies  Allergen Reactions  . Sulfa Antibiotics     unknown    VITALS:  Blood pressure 132/51, pulse 89, temperature 98.6 F (37 C), temperature source Oral, resp. rate 18, height 5\' 10"  (1.778 m), weight 86.183 kg (190 lb), SpO2 99 %.  PHYSICAL EXAMINATION:  GENERAL:  81 y.o.-year-old patient lying in the bed with no acute distress.  EYES: Pupils equal, round, reactive to light and accommodation.Looks pale. No scleral icterus. Extraocular muscles intact.  HEENT: Head atraumatic, normocephalic. Oropharynx and nasopharynx clear.  NECK:  Supple, no jugular venous distention. No thyroid enlargement, no tenderness.  LUNGS: Normal breath sounds bilaterally, no wheezing, rales,rhonchi or crepitation. No use of accessory muscles of respiration.  CARDIOVASCULAR: S1, S2 normal. No murmurs, rubs, or gallops.  ABDOMEN: Soft, nontender, nondistended. Bowel sounds present. No organomegaly or mass.  EXTREMITIES: No pedal edema, cyanosis, or clubbing.  NEUROLOGIC: Cranial nerves II through XII are  intact. Muscle strength 5/5 in all extremities. Sensation intact. Gait not checked.  PSYCHIATRIC: The patient is alert and oriented x 3.  SKIN: No obvious rash, lesion, or ulcer.    LABORATORY PANEL:   CBC  Recent Labs Lab 04/03/15 0955 04/03/15 1422  WBC 11.1*  --   HGB 7.0* 7.2*  HCT 21.6* 22.1*  PLT 523*  --    ------------------------------------------------------------------------------------------------------------------  Chemistries   Recent Labs Lab 04/02/15 1700 04/03/15 0637  NA 128* 130*  K 4.5 4.4  CL 95* 100*  CO2 27 25  GLUCOSE 106* 120*  BUN 61* 46*  CREATININE 0.98 0.75  CALCIUM 8.2* 7.8*  AST 21  --   ALT 10*  --   ALKPHOS 96  --   BILITOT 0.5  --    ------------------------------------------------------------------------------------------------------------------  Cardiac Enzymes No results for input(s): TROPONINI in the last 168 hours. ------------------------------------------------------------------------------------------------------------------  RADIOLOGY:  No results found.  EKG:   Orders placed or performed during the hospital encounter of 04/02/15  . ED EKG  . ED EKG    ASSESSMENT AND PLAN:   [name] is a 81 y.o. male with a known history of hypertension, restless leg syndrome, mechanical aortic valve replacement surgery, osteoarthritis requiring bilateral hip replacement surgeries and a recent left total hip replacement surgery on 03/21/2015. [facility] and was brought in secondary to weakness and noted to be anemic  * Acute on chronic anemia-  from recent  THR with possible PUD worsened with coumadin use -baseline hemoglobin is around 13, hemoglobin 2 weeks ago at the time of discharge after his THR surgery was around 8. Hemoccult is positive. Send stool for occult blood. 2 .  Hemoglobin now is improved from 5 --> 7 today after 2 units PRBC.  Hemoglobin checks every 8 hours 3. Appreciate GI  consult. Monitor  INR and give 1 more unit PRBC today, vit K as suggested by GI, GI considering EGD when INR is less than 1.5 , continue to hold Coumadin. ContinueIV protonix drip , on a liquid diet at this time.  * HTN-blood pressure now has improved.  we will resume his valsartan and Norvasc. Continue to monitor.  * Mechanical aortic valve replacement- goal INR 2-3 , but with his GI bleed and anemia, hold Coumadin at this time. Admitting physician discussed with [name] who was on call for  cardiology last night. Cardiology has been consulted.  * Restless leg syndrome-continue ropinirole.  Plan d/w pt, daughter ,GI and RN      All the records are reviewed and case discussed with Care Management/Social Workerr. Management plans discussed with the patient, family and they are in agreement.  CODE STATUS: full  TOTAL TIME TAKING CARE OF THIS PATIENT: 35 minutes.   POSSIBLE D/C IN 2-3 DAYS, DEPENDING ON CLINICAL CONDITION.   [name], [name] M.D on 04/03/2015 at 7:27 PM  Between 7am to 6pm - Pager - [phone] After 6pm go to [email or web address] - password [email or web address] [facility]  [facility]  Office  [phone]  CC: Primary care physician; [name], [name] B, MD

## 2015-04-03 NOTE — Clinical Social Work Note (Signed)
Clinical Social Work Assessment  Patient Details  Name: [name] MRN: [id number] Date of Birth: 1931/02/27  Date of referral:  04/03/15               Reason for consult:  Facility Placement                Permission sought to share information with:  Chartered certified accountant granted to share information::  Yes, Verbal Permission Granted  Name::      [employer]::   [facility]  Relationship::   Admissions Administrator, arts Information:     Housing/Transportation Living arrangements for the past 2 months:  Materials engineer, [place] of Information:  Patient Patient Interpreter Needed:  None Criminal Activity/Legal Involvement Pertinent to Current Situation/Hospitalization:  No - Comment as needed Significant Relationships:  Adult Children, Spouse Lives with:  Spouse Do you feel safe going back to the place where you live?  Yes Need for family participation in patient care:  No (Coment)   Social Worker assessment / plan:  Holiday representative ([place]) received referral that patient is from [place]. Patient is familiar to CSW from last admission in April 2016. CSW met with patient and introduced self and explained role of CSW department. Patient reported that him and his wife have been on the [facility] unit at [facility] for the past few weeks. Patient and wife normally live in the [place] section at [place]. Patient reported that his wife has MS and goes to [facility] when he is in the hospital. Patient reported that he would like to return to [facility] [place] when he is D/C'ed from the hospital. Per [name] admissions coordinator at [facility] patient can return when medically stable. CSW will continue to follow and assist as needed.   Blima Rich, [place] [phone]  Employment status:  Retired Nurse, adult PT Recommendations:  [place] / Referral to community resources:  [place]  Patient/Family's Response to care:  Patient is agreeable to returning to [place].   Patient/Family's Understanding of and Emotional Response to Diagnosis, Current Treatment, and Prognosis:  Patient was pleasant and in good spirits. Patient reported that his wife is doing okay.  Emotional Assessment Appearance:  Appears stated age Attitude/Demeanor/Rapport:    Affect (typically observed):  Accepting, Appropriate Orientation:  Oriented to Self, Oriented to Place, Oriented to  Time, Oriented to Situation Alcohol / Substance use:  Not Applicable Psych involvement (Current and /or in the community):  No (Comment)  Discharge Needs  Concerns to be addressed:  No discharge needs identified Readmission within the last 30 days:  No Current discharge risk:  None Barriers to Discharge:  No Barriers Identified   Loralyn Freshwater, LCSW 04/03/2015, 3:34 PM

## 2015-04-03 NOTE — Progress Notes (Signed)
Patient resting in bed,hgb- 7.2 at 1422, physical therapy x1 - ambulated with walker in room. No active bleeding noted this shift, pain meds given as needed- with relief, due to receive 1 unit of PRBC tonight. Dressing to the left hip dry/clean/intact/maintain hip protocol (surgery 03/21/15) recent left hip surgery.

## 2015-04-03 NOTE — Progress Notes (Signed)
Dr.Gouru paged, waiting for call back,to informed of patients Hgb- 7.0 and PT/Inr - 30.3/2.89.  Call back received 1216pm- Dr.Gouru informed of patients finding. Md to place order to check  Hgb q8hrs and call MD with change in Hgb.

## 2015-04-03 NOTE — Evaluation (Signed)
Physical Therapy Evaluation Patient Details Name: [name] MRN: [id number] DOB: 1931/07/03 Today's Date: 04/03/2015   History of Present Illness  Pt is a 81 yo male with recent THA on 03/21/15 who was getting [facility] and was admitted with with acute anemia (hgb 5.1).  Clinical Impression  Pt presents with weakness and loss of ROM in L LE due to recent THA.  Pt is limited by fatigue and weakness also related to acute anemia.  At the time repeat hgb was not available so therapy limited to bed exercises and transfer to chair and pt education.  Pt requires Min A for bed mobility and transfers with [facility] with RW to chair.  Pt would benefit from acute PT services to address objective findings.    Follow Up Recommendations SNF    Equipment Recommendations  Rolling walker with 5" wheels    Recommendations for Other Services       Precautions / Restrictions Precautions Precautions: Anterior Hip Restrictions Weight Bearing Restrictions: Yes LLE Weight Bearing: Weight bearing as tolerated      Mobility  Bed Mobility Overal bed mobility: Needs Assistance Bed Mobility: Supine to Sit     Supine to sit: Min assist     General bed mobility comments: Elevates trunk by extending UE's against bed, needs assist managing L LE.  Transfers Overall transfer level: Needs assistance Equipment used: Rolling walker (2 wheeled) Transfers: Sit to/from Stand Sit to Stand: Min guard         General transfer comment: Poor eccentric control with sitting, verbal cues to extend L leg.  Ambulation/Gait Ambulation/Gait assistance: Min guard Ambulation Distance (Feet): 3 Feet Assistive device: Rolling walker (2 wheeled) Gait Pattern/deviations: Step-to pattern     General Gait Details: bed to chair  Stairs            Wheelchair Mobility    Modified Rankin (Stroke Patients Only)       Balance Overall balance assessment: Needs  assistance Sitting-balance support: Bilateral upper extremity supported Sitting balance-Leahy Scale: Good Sitting balance - Comments: right lean   Standing balance support: Bilateral upper extremity supported Standing balance-Leahy Scale: Fair                               Pertinent Vitals/Pain Pain Assessment:  (c/o pain with movement, decreases at rest)    Home Living Family/patient expects to be discharged to:: Skilled nursing facility                 Additional Comments: [facility]    Prior Function Level of Independence: Needs assistance   Gait / Transfers Assistance Needed: RW           Hand Dominance        Extremity/Trunk Assessment   Upper Extremity Assessment: Overall WFL for tasks assessed           Lower Extremity Assessment: LLE deficits/detail   LLE Deficits / Details: AAROM limited by 50% and strength grossly 3/5     Communication   Communication: No difficulties  Cognition Arousal/Alertness: Awake/alert Behavior During Therapy: Restless Overall Cognitive Status: Within Functional Limits for tasks assessed                      General Comments General comments (skin integrity, edema, etc.): spasms, restless leg    Exercises Other Exercises Other Exercises: Bilateral supine therex  15-20 reps: AP, QS, HS, hip ABD/ADD (right only); standing marching x 20 reps      Assessment/Plan    PT Assessment Patient needs continued PT services  PT Diagnosis Difficulty walking;Abnormality of gait;Generalized weakness;Acute pain   PT Problem List Decreased strength;Decreased range of motion;Decreased activity tolerance;Decreased balance;Decreased mobility;Pain  PT Treatment Interventions DME instruction;Gait training;Functional mobility training;Therapeutic activities;Therapeutic exercise;Balance training;Patient/family education;Manual techniques   PT Goals (Current goals can be found in the Care Plan section)  Acute Rehab PT Goals Patient Stated Goal: To get back to rehab. PT Goal Formulation: With patient Time For Goal Achievement: 04/17/15 Potential to Achieve Goals: Good    Frequency 7X/week   Barriers to discharge        Co-evaluation               End of Session Equipment Utilized During Treatment: Gait belt Activity Tolerance: Patient limited by fatigue;Patient limited by pain Patient left: in chair;with call bell/phone within reach;with chair alarm set;with family/visitor present           Time: 1030-1110 PT Time Calculation (min) (ACUTE ONLY): 40 min   Charges:   PT Evaluation $Initial PT Evaluation Tier I: 1 Procedure PT Treatments $Therapeutic Exercise: 8-22 mins   PT G Codes:        Domitila Stetler A Maiko Salais 04/03/2015, 11:14 AM

## 2015-04-03 NOTE — Progress Notes (Signed)
Dr.Gouru paged, call back received - informed MD needing order for Crossmatch for 1 unit PRBC per lab staff, order received to place order for crossmatch for 1 unit of PRBC.

## 2015-04-03 NOTE — Progress Notes (Addendum)
Consultation  Referring Provider: Dr. Amado Coe Primary Care Physician:  [name], MD Consulting  Gastroenterologist:  Dr. Lynnae Prude       Reason for Consultation: Anemia with heme positive stool            HPI:   [name] is a 81 y.o. male with a known history of hypertension, restless leg syndrome, mechanical aortic valve replacement surgery, osteoarthritis requiring bilateral hip replacement surgeries and a recent left total hip replacement surgery on 03/21/2015. He was currently at [facility] rehabilitation and was brought in secondary to weakness that started yesterday.  Patient's hemoglobin after his surgery at the time of discharge 03/23/2015 was 8.2.  He was started on iron supplementation tablets and was discharged.He developed severe weakness yesterday  to the point that he could not get out of bed. His BP was low and he was sent to the ER. He had a Hgb of 5.1 and received  been transfused 2 units PRBC with Hgb 7.2.   He reports significant constipation with straining really hard. He has taken prune juice and stool softeners. Stool color was light brown before the iron, and  now black for  1 week. He denies any abdominal pain, n/v. Last colonoscopy  was at age 89 in [name] and he thinks it was normal.    Past Medical History  Diagnosis Date  . GERD (gastroesophageal reflux disease)   . Bradycardia     chronic, no symptoms 07/2010  . Depression     and Anxiety,  08/2010  . Anxiety 10/11  . SOB (shortness of breath) 10/11    08/2010,Episodes at 5 AM, eventually felt to be anxiety, after complete workup including catheterization, pt greatly improved with anxiety meds 11/11  . Hypertension     BP higher than usual 04/19/10; amlodipine increased by telephone  . Decreased hearing     Right ear  . Coronary artery disease     mild, cath, 08/2010  . Aortic stenosis     AVR, 2002, Mechanical  . S/P AVR     [employer]. mechanical 2002, echo 08/2010 working well  .  Warfarin anticoagulation     mechanical AVR  . Ejection fraction     EF 60%,  echo. 08/2010  . Carotid bruit     dopplers in past, no abnormalities    Past Surgical History  Procedure Laterality Date  . Valve replacement  1/02    Aortic; echo 3/09 valve working well; echo 10/11 working well; put on Coumadin  . Cardiac catheterization    . Joint replacement    . Hernia repair      History reviewed. No pertinent family history.   History  Substance Use Topics  . Smoking status: Former Games developer  . Smokeless tobacco: Not on file  . Alcohol Use: 1.2 oz/week    0 Standard drinks or equivalent, 2 Glasses of wine per week     Comment: Social    Prior to Admission medications   Medication Sig Start Date End Date Taking? Authorizing Provider  acetaminophen (TYLENOL) 500 MG tablet Take 500 mg by mouth every 4 (four) hours as needed for mild pain.    Yes Historical Provider, MD  ALPRAZolam Prudy Feeler) 0.5 MG tablet Take 0.125 mg by mouth daily as needed for anxiety.    Yes Historical Provider, MD  aluminum-magnesium hydroxide 200-200 MG/5ML suspension Take 30 mLs by mouth 2 (two) times daily as needed for indigestion.   Yes Historical Provider,  MD  amLODipine (NORVASC) 10 MG tablet Take 1 tablet by mouth  daily 01/20/15  Yes Luis Abed, MD  aspirin 81 MG tablet Take 81 mg by mouth daily.     Yes Historical Provider, MD  bisacodyl (DULCOLAX) 10 MG suppository Place 10 mg rectally daily as needed for moderate constipation.   Yes Historical Provider, MD  citalopram (CELEXA) 20 MG tablet Take 20 mg by mouth daily.   Yes Historical Provider, MD  cyclobenzaprine (FLEXERIL) 10 MG tablet Take 10 mg by mouth 3 (three) times daily as needed for muscle spasms.   Yes Historical Provider, MD  docusate sodium (COLACE) 100 MG capsule Take 100 mg by mouth daily as needed for mild constipation.   Yes Historical Provider, MD  FERROUS FUMARATE-FOLIC ACID PO Take 1 capsule by mouth 2 (two) times daily with a  meal.   Yes Historical Provider, MD  finasteride (PROSCAR) 5 MG tablet Take 5 mg by mouth daily.     Yes Historical Provider, MD  glycerin adult 2 G SUPP Place 1 suppository rectally once as needed for moderate constipation.   Yes Historical Provider, MD  Multiple Vitamin (MULTIVITAMIN) tablet Take 1 tablet by mouth daily.     Yes Historical Provider, MD  oxyCODONE (OXY IR/ROXICODONE) 5 MG immediate release tablet Take 5 mg by mouth every 4 (four) hours as needed for severe pain.   Yes Historical Provider, MD  rOPINIRole (REQUIP) 0.25 MG tablet Take 0.25 mg by mouth at bedtime.   Yes Historical Provider, MD  Tamsulosin HCl (FLOMAX) 0.4 MG CAPS Take 0.4 mg by mouth daily.     Yes Historical Provider, MD  traZODone (DESYREL) 50 MG tablet Take 50 mg by mouth at bedtime.   Yes Historical Provider, MD  valsartan (DIOVAN) 320 MG tablet Take 320 mg by mouth daily.     Yes Historical Provider, MD  warfarin (COUMADIN) 5 MG tablet Take 5 mg by mouth See admin instructions. Take only on thursdays. Patient not really sure how he is taking coumadin since last week. He states the doctor did change it.   Yes Historical Provider, MD  warfarin (COUMADIN) 7.5 MG tablet Take 7.5 mg by mouth daily. Patient states he was taking 7.5mg  tablet every day except on Thursdays take  tablet   Yes Historical Provider, MD  celecoxib (CELEBREX) 100 MG capsule Take 100 mg by mouth daily.     Historical Provider, MD  warfarin (COUMADIN) 5 MG tablet Take as directed by  Anticoagulation clinic Patient not taking: Reported on 04/02/2015 03/11/14   Luis Abed, MD    Current Facility-Administered Medications  Medication Dose Route Frequency Provider Last Rate Last Dose  . 0.9 %  sodium chloride infusion  10 mL/hr Intravenous Once Jene Every, MD      . 0.9 %  sodium chloride infusion   Intravenous Continuous Enid Baas, MD 75 mL/hr at 04/03/15 1430    . acetaminophen (TYLENOL) tablet 650 mg  650 mg Oral Q6H PRN Enid Baas, MD       Or  . acetaminophen (TYLENOL) suppository 650 mg  650 mg Rectal Q6H PRN Enid Baas, MD      . albuterol (PROVENTIL) (2.5 MG/3ML) 0.083% nebulizer solution 2.5 mg  2.5 mg Nebulization Q2H PRN Enid Baas, MD      . ALPRAZolam Prudy Feeler) tablet 0.125 mg  0.125 mg Oral Daily PRN Enid Baas, MD      . aluminum-magnesium hydroxide 200-200 MG/5ML suspension 30 mL  30 mL Oral BID PRN Enid Baasadhika Kalisetti, MD      . bisacodyl (DULCOLAX) suppository 10 mg  10 mg Rectal Daily PRN Enid Baasadhika Kalisetti, MD      . citalopram (CELEXA) tablet 20 mg  20 mg Oral Daily Enid Baasadhika Kalisetti, MD   20 mg at 04/03/15 1004  . cyclobenzaprine (FLEXERIL) tablet 10 mg  10 mg Oral TID PRN Enid Baasadhika Kalisetti, MD   10 mg at 04/03/15 1032  . docusate sodium (COLACE) capsule 100 mg  100 mg Oral BID PRN Enid Baasadhika Kalisetti, MD      . ferrous fumarate-b12-vitamic C-folic acid (TRINSICON / FOLTRIN) capsule 1 capsule  1 capsule Oral BID PC Enid Baasadhika Kalisetti, MD   1 capsule at 04/03/15 1002  . finasteride (PROSCAR) tablet 5 mg  5 mg Oral Daily Enid Baasadhika Kalisetti, MD   5 mg at 04/03/15 1003  . HYDROcodone-acetaminophen (NORCO/VICODIN) 5-325 MG per tablet 1-2 tablet  1-2 tablet Oral Q4H PRN Enid Baasadhika Kalisetti, MD   1 tablet at 04/03/15 1130  . morphine 2 MG/ML injection 2 mg  2 mg Intravenous Q4H PRN Enid Baasadhika Kalisetti, MD      . multivitamin tablet 1 tablet  1 tablet Oral Daily Enid Baasadhika Kalisetti, MD   1 tablet at 04/03/15 1003  . ondansetron (ZOFRAN) injection 4 mg  4 mg Intravenous Q6H PRN Enid Baasadhika Kalisetti, MD      . pantoprazole (PROTONIX) 80 mg in sodium chloride 0.9 % 250 mL (0.32 mg/mL) infusion  8 mg/hr Intravenous Continuous Enid Baasadhika Kalisetti, MD 25 mL/hr at 04/03/15 1430 8 mg/hr at 04/03/15 1430  . [START ON 04/06/2015] pantoprazole (PROTONIX) injection 40 mg  40 mg Intravenous Q12H Enid Baasadhika Kalisetti, MD   40 mg at 04/02/15 2325  . rOPINIRole (REQUIP) tablet 0.75 mg  0.75 mg Oral QHS Ramonita LabAruna Gouru, MD       . senna (SENOKOT) tablet 8.6 mg  1 tablet Oral Daily PRN Enid Baasadhika Kalisetti, MD      . sodium chloride 0.9 % injection 3 mL  3 mL Intravenous Q12H Enid Baasadhika Kalisetti, MD   3 mL at 04/02/15 2243  . tamsulosin (FLOMAX) capsule 0.4 mg  0.4 mg Oral Daily Enid Baasadhika Kalisetti, MD   0.4 mg at 04/03/15 1004  . traZODone (DESYREL) tablet 50 mg  50 mg Oral QHS Enid Baasadhika Kalisetti, MD   50 mg at 04/02/15 2238    Allergies as of 04/02/2015 - Review Complete 04/02/2015  Allergen Reaction Noted  . Sulfa antibiotics  03/24/2015     Review of Systems:    A 12 system review was obtained and all negative except where noted in HPI. He has bad RLS and left leg spasms. Today he is feeling better after transfusion. He is tolerating a liquid diet. He denies NSAID use on coumadin .     Physical Exam:  Vital signs in last 24 hours: Temp:  [98 F (36.7 C)-98.3 F (36.8 C)] 98.3 F (36.8 C) (05/09 0752) Pulse Rate:  [84-97] 95 (05/09 0752) Resp:  [16-18] 18 (05/09 0752) BP: (91-123)/(40-80) 123/55 mmHg (05/09 0752) SpO2:  [95 %-100 %] 97 % (05/09 0752) Weight:  [86.183 kg (190 lb)] 86.183 kg (190 lb) (05/08 1618) Last BM Date: 04/02/15  General:  Well-developed, well-nourished and in no acute distress Head:  Head without obvious abnormality, atraumatic  Eyes:   Conjunctiva pale pink, sclera anicteric   ENT:   Mouth free of lesions, mucosa moist, tongue pink, no thrush noted Neck:   Supple w/o thyromegaly or mass, trachea midline,  no adenopathy  Lungs: Clear to auscultation bilaterally, respirations unlabored Heart:     Normal S1S2, no rubs, positive  Murmur and click consistent with valve history, no gallops. Abdomen: Soft, non-tender, no hepatosplenomegaly, hernia, or mass and BS normal Rectal: Deferred Lymph:  No cervical or supraclavicular adenopathy. Extremities:   No edema, cyanosis, or clubbing Skin  Skin color pale , texture, turgor normal, no rashes or lesions Neuro:  A&O x 3. CNII-XII intact,  normal strength Psych:  Appropriate mood and affect.  Data Reviewed:  LAB RESULTS:  Recent Labs  04/02/15 1700 04/03/15 0955 04/03/15 1422  WBC 12.7* 11.1*  --   HGB 5.1* 7.0* 7.2*  HCT 16.1* 21.6* 22.1*  PLT 638* 523*  --    BMET  Recent Labs  04/02/15 1700 04/03/15 0637  NA 128* 130*  K 4.5 4.4  CL 95* 100*  CO2 27 25  GLUCOSE 106* 120*  BUN 61* 46*  CREATININE 0.98 0.75  CALCIUM 8.2* 7.8*   LFT  Recent Labs  04/02/15 1700  PROT 5.4*  ALBUMIN 3.0*  AST 21  ALT 10*  ALKPHOS 96  BILITOT 0.5   PT/INR  Recent Labs  04/02/15 1700 04/03/15 0955  LABPROT 31.9* 30.3*  INR 3.09 2.89    STUDIES: No results found.   Assessment:  Undra Jennefer Bravornold Hampe is a 81 y.o. hem positive, melena, anemia with elevated BUN> Recent hip replacement-and will need abx prophylaxis. Chronic coumadin now on  hold. Etiology for heme positive and  anemia is likely fro upper gi source from possible gastric or duodenal  erosion, ulcers, especially given recent elevation in BUN.    Plan:  EGD when his INR is < 1.5  Zosyn to be given 1 hour before the endoscopy.  Dr. Mechele CollinElliott recommend Vit K 5mg  SQ today.  Continue the Protonix.  Strongly consider transfusing 1 more unit of PRBC given his CV history  This case was discussed with Dr. Scot Junobert T. Elliott in collaboration of care. Thank you for the consultation.  These services provided by Amedeo KinsmanKimberly Urias Sheek RN, MSN, ANP-BC under collaborative practice agreement with Scot Junobert T. Elliott, MD.  04/03/2015, 3:19 PM

## 2015-04-03 NOTE — Consult Note (Signed)
The patient is an 81 year old white male who 2 weeks ago had left hip replacement.  At the time of discharge after his hip replacement his hemoglobin was 8 but because of bleeding his hemoglobin has fallen to 5 and he was admitted to the hospital. He was very weak and his blood pressure was in the 80s systolic he has received 2 units of blood and a third unit is pending.  He has been on Coumadin for 14 years the cause of a mechanical aortic valve is [place] 81 [place] Julio Almora Brody  Since his hip replacement he has been on iron supplement and his stools are dark however his BUN to creatinine ratio is significantly elevated suggesting possible upper GI bleed  Physical exam shows his chest is clear his heart shows a 2/6 systolic murmur and a loud S2.  Because of his elevated INR due to Coumadin we will give a small dose of vitamin K and recheck his pro time in the morning it will likely be Wednesday before we can do an upper endoscopy I will follow with you

## 2015-04-03 NOTE — Progress Notes (Signed)
Chaplain met with patient and daughter, planned visit; chaplain provided emotional support and prayer Chaplain Allyne Gee. [name] Monday 04-03-2015    04/03/15 1500  Clinical Encounter Type  Visited With Patient and family together  Visit Type Follow-up  Referral From Chaplain  Consult/Referral To Chaplain  Spiritual Encounters  Spiritual Needs Prayer;Emotional  Stress Factors  Patient Stress Factors Health changes  Family Stress Factors Health changes

## 2015-04-04 ENCOUNTER — Encounter
Admission: EM | Disposition: A | Discharge status: Skilled Nursing Facility | Payer: Self-pay | Source: Home / Self Care | Attending: Internal Medicine

## 2015-04-04 ENCOUNTER — Encounter: Payer: Self-pay | Admitting: Physician Assistant

## 2015-04-04 DIAGNOSIS — Z7189 Other specified counseling: Secondary | ICD-10-CM

## 2015-04-04 DIAGNOSIS — Z952 Presence of prosthetic heart valve: Secondary | ICD-10-CM | POA: Diagnosis not present

## 2015-04-04 DIAGNOSIS — Z954 Presence of other heart-valve replacement: Secondary | ICD-10-CM

## 2015-04-04 DIAGNOSIS — D500 Iron deficiency anemia secondary to blood loss (chronic): Secondary | ICD-10-CM

## 2015-04-04 DIAGNOSIS — D62 Acute posthemorrhagic anemia: Principal | ICD-10-CM

## 2015-04-04 LAB — CBC
HEMATOCRIT: 23.2 % — AB (ref 40.0–52.0)
HEMOGLOBIN: 7.7 g/dL — AB (ref 13.0–18.0)
MCH: 28.1 pg (ref 26.0–34.0)
MCHC: 33.2 g/dL (ref 32.0–36.0)
MCV: 84.5 fL (ref 80.0–100.0)
Platelets: 488 10*3/uL — ABNORMAL HIGH (ref 150–440)
RBC: 2.75 MIL/uL — AB (ref 4.40–5.90)
RDW: 17.2 % — ABNORMAL HIGH (ref 11.5–14.5)
WBC: 9.3 10*3/uL (ref 3.8–10.6)

## 2015-04-04 LAB — URINALYSIS COMPLETE WITH MICROSCOPIC (ARMC ONLY)
BACTERIA UA: NONE SEEN
Bilirubin Urine: NEGATIVE
GLUCOSE, UA: NEGATIVE mg/dL
HGB URINE DIPSTICK: NEGATIVE
Ketones, ur: NEGATIVE mg/dL
LEUKOCYTES UA: NEGATIVE
NITRITE: NEGATIVE
PROTEIN: NEGATIVE mg/dL
SPECIFIC GRAVITY, URINE: 1.004 — AB (ref 1.005–1.030)
Squamous Epithelial / LPF: NONE SEEN
pH: 7 (ref 5.0–8.0)

## 2015-04-04 LAB — PROTIME-INR
INR: 2.1
Prothrombin Time: 23.7 seconds — ABNORMAL HIGH (ref 11.4–15.0)

## 2015-04-04 LAB — HEMOGLOBIN
Hemoglobin: 7.6 g/dL — ABNORMAL LOW (ref 13.0–18.0)
Hemoglobin: 9.9 g/dL — ABNORMAL LOW (ref 13.0–18.0)

## 2015-04-04 LAB — HEMATOCRIT: HCT: 28.4 % — ABNORMAL LOW (ref 40.0–52.0)

## 2015-04-04 SURGERY — ESOPHAGOGASTRODUODENOSCOPY (EGD) WITH PROPOFOL
Anesthesia: Monitor Anesthesia Care

## 2015-04-04 MED ORDER — ROPINIROLE HCL 0.25 MG PO TABS
0.5000 mg | ORAL_TABLET | Freq: Three times a day (TID) | ORAL | Status: DC
  Administered 2015-04-04 – 2015-04-08 (×13): 0.5 mg via ORAL
  Filled 2015-04-04 (×7): qty 2
  Filled 2015-04-04: qty 1
  Filled 2015-04-04 (×2): qty 2
  Filled 2015-04-04: qty 1
  Filled 2015-04-04 (×4): qty 2

## 2015-04-04 NOTE — Consult Note (Addendum)
Cardiology Consultation Note  Patient ID: [name], MRN: [id number], DOB/AGE: 79/22/1932 79 y.o. Admit date: 04/02/2015   Date of Consult: 04/04/2015 Primary Physician: [name], [name], MD Primary Cardiologist: Dr. Myrtis SerKatz, MD  Chief Complaint: Weakness and dark stools since 5/7 Reason for Consult: Management of Coumadin in the setting of acute on chronic anemia with mechanical AVR with planned EGD  HPI: 81 y.o. male with h/o aortic stenosis s/p St. Jude mechanical aortic valve replacement in 2002 on chronic Coumadin therapy, mild CAD by cardiac cath in 2011 managed medically, GERD, HTN, carotid bruits, history of asymptomatic bradycardia, RLS, anxiety, depression, and osteoarthritis s/p total left hip replacement on 03/21/2015 who was [facility]  to rehab 2 weeks ago 2/2 the above surgery with a hgb of 8 (baseline 12), he was started on po iron replacement therapy who presented to on 5/8 with acute onset of weakness, melena, BRBPR, and was found to have a hgb of 5.1. Cardiology is consulted for further management of his Coumadin.   Patient with known mild CAD by cardiac catheterization in 2011. Cath showed mild nonobstructive disease at that time with good function of the mechanical aortic valve. Details included 20% stenosis of LAD just prior to diagonal and 40% tubular stenosis just after the diagonal. LCx and RCA were angiographically normal. No interventions were performed. Last echo done in 08/2010 showed an EF of 65-70%, no regional wall motion abnormalities, GR1DD, trivial aortic regurgitation - valve was functioning well. He has been on Coumadin and tolerating this well. He has known osteoarthritis and underwent successful left total hip replacement on 03/21/2015. At the time of discharge his hgb was 8. Baseline hgb is approximately 12. He was started on po iron replacement therapy. He was discharged to [facility] and was tolerating this well until  the morning of 5/7 when he became quite tired and weak. This was associated with dark stools. He attributed this to his iron therapy. He had been constipated and was having to strain with his bowel movements. On 5/8 he noted some BRBPR coating the stool. [facility].   Upon [facility] to [facility] he was found to have a new onset acute on chronic anemia with a hgb of 5.1. He received 2 units of pRBC in the ED. His Coumadin was held. Hgb has improved to the mid 7 range, currently stable at 7.7. He has since received a third unit by the time consult was placed. He was found to be heme positive. GI was consulted and felt he would need appropriate work up through EGD. He received small dose of vitamin K of 5 mg on 5/9. There is discussion for possible EGD on 5/11 if INR is appropriate.    Past Medical History  Diagnosis Date  . GERD (gastroesophageal reflux disease)   . Bradycardia     chronic, no symptoms 07/2010  . Depression     and Anxiety,  08/2010  . Anxiety 10/11  . SOB (shortness of breath) 10/11    08/2010,Episodes at 5 AM, eventually felt to be anxiety, after complete workup including catheterization, pt greatly improved with anxiety meds 11/11  . Hypertension     BP higher than usual 04/19/10; amlodipine increased by telephone  . Decreased hearing     Right ear  . Coronary artery disease     mild, cath, 08/2010  . Aortic stenosis     AVR, 2002, Mechanical  .  S/P AVR     St. Jude. mechanical 04-08-01, echo 08/2010 working well  . Warfarin anticoagulation     mechanical AVR  . Ejection fraction     EF 60%,  echo. 08/2010  . Carotid bruit     dopplers in past, no abnormalities      Most Recent Cardiac Studies: Cardiac catheterization 04/08/2010:  Cath showed mild nonobstructive disease at that time with good function of the mechanical aortic valve. Details included 20% stenosis of LAD just prior to diagonal and 40% tubular stenosis just after the diagonal. LCx  and RCA were angiographically normal. No interventions were performed.  Echo 08/2010:  EF of 65-70%, no regional wall motion abnormalities, GR1DD, trivial aortic regurgitation - valve was functioning well.    Surgical History:  Past Surgical History  Procedure Laterality Date  . Valve replacement  1/02    Aortic; echo 3/09 valve working well; echo 10/11 working well; put on Coumadin  . Cardiac catheterization    . Joint replacement    . Hernia repair       Home Meds: Prior to Admission medications   Medication Sig Start Date End Date Taking? Authorizing Provider  acetaminophen (TYLENOL) 500 MG tablet Take 500 mg by mouth every 4 (four) hours as needed for mild pain.    Yes Historical Provider, MD  ALPRAZolam Prudy Feeler) 0.5 MG tablet Take 0.125 mg by mouth daily as needed for anxiety.    Yes Historical Provider, MD  aluminum-magnesium hydroxide 200-200 MG/5ML suspension Take 30 mLs by mouth 2 (two) times daily as needed for indigestion.   Yes Historical Provider, MD  amLODipine (NORVASC) 10 MG tablet Take 1 tablet by mouth  daily 01/20/15  Yes Luis Abed, MD  aspirin 81 MG tablet Take 81 mg by mouth daily.     Yes Historical Provider, MD  bisacodyl (DULCOLAX) 10 MG suppository Place 10 mg rectally daily as needed for moderate constipation.   Yes Historical Provider, MD  citalopram (CELEXA) 20 MG tablet Take 20 mg by mouth daily.   Yes Historical Provider, MD  cyclobenzaprine (FLEXERIL) 10 MG tablet Take 10 mg by mouth 3 (three) times daily as needed for muscle spasms.   Yes Historical Provider, MD  docusate sodium (COLACE) 100 MG capsule Take 100 mg by mouth daily as needed for mild constipation.   Yes Historical Provider, MD  FERROUS FUMARATE-FOLIC ACID PO Take 1 capsule by mouth 2 (two) times daily with a meal.   Yes Historical Provider, MD  finasteride (PROSCAR) 5 MG tablet Take 5 mg by mouth daily.     Yes Historical Provider, MD  glycerin adult 2 G SUPP Place 1 suppository rectally  once as needed for moderate constipation.   Yes Historical Provider, MD  Multiple Vitamin (MULTIVITAMIN) tablet Take 1 tablet by mouth daily.     Yes Historical Provider, MD  oxyCODONE (OXY IR/ROXICODONE) 5 MG immediate release tablet Take 5 mg by mouth every 4 (four) hours as needed for severe pain.   Yes Historical Provider, MD  rOPINIRole (REQUIP) 0.25 MG tablet Take 0.25 mg by mouth at bedtime.   Yes Historical Provider, MD  Tamsulosin HCl (FLOMAX) 0.4 MG CAPS Take 0.4 mg by mouth daily.     Yes Historical Provider, MD  traZODone (DESYREL) 50 MG tablet Take 50 mg by mouth at bedtime.   Yes Historical Provider, MD  valsartan (DIOVAN) 320 MG tablet Take 320 mg by mouth daily.     Yes Historical Provider, MD  warfarin (COUMADIN) 5 MG tablet Take 5 mg by mouth See admin instructions. Take only on thursdays. Patient not really sure how he is taking coumadin since last week. He states the doctor did change it.   Yes Historical Provider, MD  warfarin (COUMADIN) 7.5 MG tablet Take 7.5 mg by mouth daily. Patient states he was taking 7.5mg  tablet every day except on Thursdays take 5mg  tablet   Yes Historical Provider, MD  celecoxib (CELEBREX) 100 MG capsule Take 100 mg by mouth daily.     Historical Provider, MD  warfarin (COUMADIN) 5 MG tablet Take as directed by  Anticoagulation clinic Patient not taking: Reported on 04/02/2015 03/11/14   Luis AbedJeffrey D Katz, MD    Inpatient Medications:  . amLODipine  10 mg Oral Daily  . citalopram  20 mg Oral Daily  . ferrous fumarate-b12-vitamic C-folic acid  1 capsule Oral BID PC  . finasteride  5 mg Oral Daily  . irbesartan  75 mg Oral Daily  . multivitamin  1 tablet Oral Daily  . [START ON 04/06/2015] pantoprazole (PROTONIX) IV  40 mg Intravenous Q12H  . rOPINIRole  0.5 mg Oral TID  . sodium chloride  3 mL Intravenous Q12H  . tamsulosin  0.4 mg Oral Daily  . traZODone  50 mg Oral QHS   . pantoprozole (PROTONIX) infusion 8 mg/hr (04/04/15 0055)    Allergies:    Allergies  Allergen Reactions  . Sulfa Antibiotics     unknown    History   Social History  . Marital Status: Married    Spouse Name: N/A  . Number of Children: N/A  . Years of Education: N/A   Occupational History  . Retired    Social History Main Topics  . Smoking status: Former Games developermoker  . Smokeless tobacco: Not on file  . Alcohol Use: 1.2 oz/week    0 Standard drinks or equivalent, 2 Glasses of wine per week     Comment: Social  . Drug Use: No  . Sexual Activity: Not on file   Other Topics Concern  . Not on file   Social History Narrative   No regular exercise.     Family History  Problem Relation Age of Onset  . Hypertension       Review of Systems: Review of Systems  Constitutional: Positive for malaise/fatigue. Negative for fever, chills, weight loss and diaphoresis.  Eyes: Negative for discharge and redness.  Respiratory: Negative for cough, hemoptysis and shortness of breath.   Cardiovascular: Negative for chest pain, palpitations, orthopnea, claudication, leg swelling and PND.  Gastrointestinal: Positive for heartburn, nausea, vomiting, abdominal pain, constipation, blood in stool and melena. Negative for diarrhea.  Genitourinary: Negative for hematuria and flank pain.  Neurological: Positive for weakness.     Labs: No results for input(s): CKTOTAL, CKMB, TROPONINI in the last 72 hours. Lab Results  Component Value Date   WBC 9.3 04/04/2015   HGB 7.7* 04/04/2015   HCT 23.2* 04/04/2015   MCV 84.5 04/04/2015   PLT 488* 04/04/2015     Recent Labs Lab 04/02/15 1700 04/03/15 0637  NA 128* 130*  K 4.5 4.4  CL 95* 100*  CO2 27 25  BUN 61* 46*  CREATININE 0.98 0.75  CALCIUM 8.2* 7.8*  PROT 5.4*  --   BILITOT 0.5  --   ALKPHOS 96  --   ALT 10*  --   AST 21  --   GLUCOSE 106* 120*   No results found for: CHOL, HDL,  LDLCALC, TRIG No results found for: DDIMER  Radiology/Studies:  US Venous Img Lower Unilateral Left  03/15/2015    CLINICAL DATA:  Acute left thigh pain and swelling  EXAM: LEFT LOWER EXTREMITY VENOUS DOPPLER ULTRASOUND  TECHNIQUE: Gray-scale sonography with graded compression, as well as color Doppler and duplex ultrasound were performed to evaluate the lower extremity deep venous systems from the level of the common femoral vein and including the common femoral, femoral, profunda femoral, popliteal and calf veins including the posterior tibial, peroneal and gastrocnemius veins when visible. The superficial great saphenous vein was also interrogated. Spectral Doppler was utilized to evaluate flow at rest and with distal augmentation maneuvers in the common femoral, femoral and popliteal veins.  COMPARISON:  None.  FINDINGS: Contralateral Common Femoral Vein: Respiratory phasicity is normal and symmetric with the symptomatic side. No evidence of thrombus. Normal compressibility.  Common Femoral Vein: No evidence of thrombus. Normal compressibility, respiratory phasicity and response to augmentation.  Saphenofemoral Junction: No evidence of thrombus. Normal compressibility and flow on color Doppler imaging.  Profunda Femoral Vein: No evidence of thrombus. Normal compressibility and flow on color Doppler imaging.  Femoral Vein: No evidence of thrombus. Normal compressibility, respiratory phasicity and response to augmentation.  Popliteal Vein: No evidence of thrombus. Normal compressibility, respiratory phasicity and response to augmentation.  Calf Veins: No evidence of thrombus. Normal compressibility and flow on color Doppler imaging.  Superficial Great Saphenous Vein: No evidence of thrombus. Normal compressibility and flow on color Doppler imaging.  Venous Reflux:  None.  Other Findings:  None.  IMPRESSION: No evidence of deep venous thrombosis.   Electronically Signed   By: Judie Petit.  Shick M.D.   On: 03/15/2015 15:52    EKG: NSR (p waves are visible in V1), 92 bpm, rare PACs, no significant st/t changes   Weights: Filed  Weights   04/02/15 1618  Weight: 190 lb (86.183 kg)     Physical Exam: Blood pressure 143/65, pulse 97, temperature 98.6 F (37 C), temperature source Oral, resp. rate 18, height  (1.778 m), weight 190 lb (86.183 kg), SpO2 99 %. Body mass index is 27.26 kg/(m^2). General: Well developed, well nourished, in no acute distress. Head: Normocephalic, atraumatic, sclera non-icteric, no xanthomas, nares are without discharge.  Neck: Negative for carotid bruits. JVD not elevated. Lungs: Clear bilaterally to auscultation without wheezes, rales, or rhonchi. Breathing is unlabored. Heart: RRR with S1 S2. Harsh systolic 3/6 murmur with click. No rubs, or gallops appreciated. Abdomen: Soft, non-tender, non-distended with normoactive bowel sounds. No hepatomegaly. No rebound/guarding. No obvious abdominal masses. Msk:  Strength and tone appear normal for age. Extremities: No clubbing or cyanosis. No edema.   Neuro: Alert and oriented X 3. No facial asymmetry. No focal deficit. Moves all extremities spontaneously. Psych:  Responds to questions appropriately with a normal affect.    Assessment and Plan:  81 year old male with h/o aortic stenosis s/p St. Jude mechanical aortic valve replacement in 2002 on chronic Coumadin therapy, mild CAD by cardiac cath in 2011 managed medically, GERD, HTN, carotid bruits, history of asymptomatic bradycardia, RLS, anxiety, depression, and osteoarthritis s/p total left hip replacement on 03/21/2015 who was recently discharged from [facility]  to rehab 2 weeks ago 2/2 the above surgery with a hgb of 8 (baseline 12), he was started on po iron replacement therapy, who presented to on 5/8 with acute onset of weakness, melena, BRBPR, and was found to have a hgb of 5.1.   1. Acute on  chronic anemia: -Likely in the setting of upper GI bleed given melena, heme positive stool, and elevated BUN/SCr ratio per GI -GI is planning for possible EGD 5/11 if INR is appropriate.  -He  received small dose of vitamin K 5 mg on 5/9 to aid in this process, INR will hopefully be responsive by 5/11 without further intervention given his history of mechanical AVR -Findings of EGD would play some role in resumption of anticoagulation, though would not want to be off anticoagulation for extended period of time given mechanical AVR -If needed post procedure, and if ok with GI from a bleeding risk, could possibly use Lovenox bridge -Will need close monitoring of hgb/hct for stability    2. History of mechanical AVR: -Coumadin on hold as above -Last echo 08/2010 demonstrated valve was functioning well  3. Nonobstructive CAD by cardiac cath 2011: -No symptoms of angina -On long term Coumadin in place of aspirin -No ischemia work planned at this time -Continue outpatient regimen   4. HTN: -Well controlled -Continue current medication  5. Status post left hip replacement: -Continue PT  6. GERD: -IV Protonix     Signed, DUNN,RYAN PA-C 04/04/2015, 10:34 AM   Patient seen and examined on rounds with Eula Listen Agree with above Warfarin being held and vitamin K even yesterday in preparationfor EGD possibly tomorrow Risk and benefit discussed with the patient. He is aware of the risk of coming off his warfarin. Lovenox not needed at this time given his GI bleed. By his history, I suspect his bleeding started on May 7, getting worse on May 8, presenting late on May 8.  He had dark stools over the weekend. As he will need chronic anticoagulation for mechanical valve, the benefit of EGD would likely outweigh the risk. Ideally etiology can be determined Aspirin will not be needed in the future, And hopefully can be restarted on warfarin following the procedure tomorrow Currently with no symptoms of angina Will monitor with you  Dossie Arbour, M.D., Ph.D.

## 2015-04-04 NOTE — Progress Notes (Signed)
Patient was to be seen in my office tomorrow for staple removal. He is status post removal deep hardware and can conversion of prior hip surgery to total hip arthroplasty post femoral neck fracture and avascular necrosis. On exam his incisions appear to be healing well and we'll plan on removing staples tomorrow and Steri-Stripped wounds. He'll need to be followed up in 4 weeks in our office

## 2015-04-04 NOTE — Progress Notes (Signed)
RD Assessment  Admitted with: Weakness PMHx: HTN  Current Diet: Clear Liquid Typical Food/ Fluid Intake: 100% of diet recorded 5/9 lunch time Meal/ Snack Patterns: Pt reports a decreased appetite and intake of meals at rehab facility x 2 weeks- reports he does not like the food  Supplements: Ensure 1x/ day at rehab to supplement  Food Allergies: NKFA Food Preferences: Reviewed  Ht: 5'10" Current weight: 190# BMI: 27.3 Weight Changes: Patient reports a UBW of 184-190#. Current weight is 100% of UBW.   UOP: Reviewed Digestive: Last BM 5/8 Gastrointestinal: Reviewed Skin: Stage I PU- heel Physical Findings: n/a  Labs: Reviewed  Meds: B12, MVI  PES Statement: Inadequate energy intake related to acute illness as evidenced by patient report of poor intake of meals and decreased appetite x past 2 weeks, pt on Clear Liquid diet.   Intervention:  1. Medical Nutrition Supplement: Once diet advanced past clear liquid, will reorder Ensure to supplement intake.  Monitoring/ Evaluation:  1. Energy Intake: goal of diet advancement as medically appropriate within 48 hours. 2. Labs   LOW Care Level

## 2015-04-04 NOTE — Progress Notes (Signed)
Pt has restless leg syndrome, sw DR gouru and orders obtained for requip tid, pt also requires flexeril ;and pain med for relief , pt up in chair several times today, sw Dr Mechele CollinElliott  Procedure will be scheduled for tomorrow pt informed and given clear liq diet

## 2015-04-04 NOTE — Progress Notes (Signed)
Per RN patient is not medically stable for D/C today. Plan is for patient to D/C to [facility] when stable. Clinical Social Worker (CSW) will continue to follow and assist as needed.   Jetta LoutBailey Morgan, LCSWA [phone]

## 2015-04-04 NOTE — Progress Notes (Signed)
Physical Therapy Treatment Patient Details Name: [name] BergerSydnor Arnold Yapp MRN: [id number] DOB: 11-Jul-1931 Today's Date: 04/04/2015    History of Present Illness Pt is a 81 yo male with recent THA on 03/21/15 who was [facility] and was admitted with with acute anemia (hgb 5.1). S/p tranfsion, pt presenting with Hb: 7.7, denies signs associated with anemia.     PT Comments    Pt making progress toward goals as evidenced by improved activity tolerance and decreased c/o pain. Pt continues to exhibit significant weakness in L hip and should continues to POC to improve functional strength. Patient presents with impairment of strength, pain, range of motion, and activity tolerance, limiting ability to perform ADL, IADL, and ambulation. Patient will benefit from skilled intervention to address the above impairments and limitations, in order to restore to prior level of function and to decrease caregiver burden.    Follow Up Recommendations  SNF     Equipment Recommendations  Rolling walker with 5" wheels    Recommendations for Other Services       Precautions / Restrictions Precautions Precautions: Anterior Hip Restrictions Weight Bearing Restrictions: No LLE Weight Bearing: Weight bearing as tolerated    Mobility  Bed Mobility Overal bed mobility: Needs Assistance Bed Mobility: Supine to Sit     Supine to sit: Min assist (Limited mobility with LLE, requires assistance for Abduction. )        Transfers Overall transfer level: Needs assistance Equipment used: Rolling walker (2 wheeled) Transfers: Sit to/from Stand (Elevated surface; limited mobility in L knee (chronic problem)) Sit to Stand: Supervision         General transfer comment: Limited L knee mobility effecting use during comng up.   Ambulation/Gait Ambulation/Gait assistance: Min guard Ambulation Distance (Feet): 180 Feet Assistive device: Rolling walker (2 wheeled) Gait  Pattern/deviations:  (Equal, but short step length. ) Gait velocity: .6733m/s Gait velocity interpretation: <1.8 ft/sec, indicative of risk for recurrent falls     Stairs            Wheelchair Mobility    Modified Rankin (Stroke Patients Only)       Balance Overall balance assessment: No apparent balance deficits (not formally assessed)                                  Cognition Arousal/Alertness: Awake/alert Behavior During Therapy: WFL for tasks assessed/performed Overall Cognitive Status: Within Functional Limits for tasks assessed                      Exercises Other Exercises Other Exercises: 10x Sit/Stand, elevated surface with RW.     General Comments        Pertinent Vitals/Pain Pain Assessment: No/denies pain (Mild cramping reported in LLE. )    Home Living                      Prior Function            PT Goals (current goals can now be found in the care plan section) Acute Rehab PT Goals Patient Stated Goal: To get back to rehab. PT Goal Formulation: With patient Time For Goal Achievement: 04/17/15 Potential to Achieve Goals: Good Progress towards PT goals: Progressing toward goals    Frequency  7X/week    PT Plan Current plan remains appropriate    Co-evaluation  End of Session Equipment Utilized During Treatment: Gait belt Activity Tolerance: Patient limited by fatigue Patient left: in chair;with call bell/phone within reach;with chair alarm set;with family/visitor present     Time: 1191-47821142-1205 PT Time Calculation (min) (ACUTE ONLY): 23 min  Charges:  $Gait Training: 8-22 mins $Therapeutic Activity: 23-37 mins                    G Codes:      Kinjal Neitzke C 04/04/2015, 1:14 PM  Rosamaria LintsAllan C Tyana Butzer, PT, DPT, BM

## 2015-04-04 NOTE — Consult Note (Signed)
Pt off coumadin and got small dose of vit K.  INR 2.1 this morning.  Want it 1.5 or below.  FFP infusion in morning if his INR is above 1.5 would be good idea.  Hgb up to 9.9 today.  No chest pain.  Possible EGD tomorrow.

## 2015-04-04 NOTE — Progress Notes (Signed)
[facility] - [place] at [facility]   PATIENT NAME: [name] Molinari    MR#:  [id number]  DATE OF BIRTH:  1931/05/23  SUBJECTIVE:  CHIEF COMPLAINT:  Weaknesss No active bleeding per pts report, denies any chest pain or dizziness, resting comfortably, out of bed to chair  REVIEW OF SYSTEMS:  CONSTITUTIONAL: No fever, fatigue, has weakness.  EYES: No blurred or double vision.  EARS, NOSE, AND THROAT: No tinnitus or ear pain.  RESPIRATORY: No cough, shortness of breath, wheezing or hemoptysis.  CARDIOVASCULAR: No chest pain, orthopnea, edema.  GASTROINTESTINAL: No nausea, vomiting, diarrhea or abdominal pain.  GENITOURINARY: No dysuria, hematuria.  ENDOCRINE: No polyuria, nocturia,  HEMATOLOGY: No anemia, easy bruising or bleeding SKIN: No rash or lesion. MUSCULOSKELETAL: No joint pain or arthritis.   NEUROLOGIC: No tingling, numbness, weakness.  PSYCHIATRY: No anxiety or depression.   DRUG ALLERGIES:   Allergies  Allergen Reactions  . Sulfa Antibiotics     unknown    VITALS:  Blood pressure 137/54, pulse 92, temperature 97.9 F (36.6 C), temperature source Oral, resp. rate 18, height 5\' 10"  (1.778 m), weight 86.183 kg (190 lb), SpO2 97 %.  PHYSICAL EXAMINATION:  GENERAL:  81 y.o.-year-old patient lying in the bed with no acute distress.  EYES: Pupils equal, round, reactive to light and accommodation.Looks pale. No scleral icterus. Extraocular muscles intact.  HEENT: Head atraumatic, normocephalic. Oropharynx and nasopharynx clear.  NECK:  Supple, no jugular venous distention. No thyroid enlargement, no tenderness.  LUNGS: Normal breath sounds bilaterally, no wheezing, rales,rhonchi or crepitation. No use of accessory muscles of respiration.  CARDIOVASCULAR: S1, S2 normal. No murmurs, rubs, or gallops.  ABDOMEN: Soft, nontender, nondistended. Bowel sounds present. No organomegaly or mass.  EXTREMITIES: No pedal edema, cyanosis, or clubbing.   NEUROLOGIC: Cranial nerves II through XII are intact. Muscle strength 5/5 in all extremities. Sensation intact. Gait not checked.  PSYCHIATRIC: The patient is alert and oriented x 3.  SKIN: No obvious rash, lesion, or ulcer.    LABORATORY PANEL:   CBC  Recent Labs Lab 04/04/15 0431 04/04/15 1446  WBC 9.3  --   HGB 7.7* 9.9*  HCT 23.2* 28.4*  PLT 488*  --    ------------------------------------------------------------------------------------------------------------------  Chemistries   Recent Labs Lab 04/02/15 1700 04/03/15 0637  NA 128* 130*  K 4.5 4.4  CL 95* 100*  CO2 27 25  GLUCOSE 106* 120*  BUN 61* 46*  CREATININE 0.98 0.75  CALCIUM 8.2* 7.8*  AST 21  --   ALT 10*  --   ALKPHOS 96  --   BILITOT 0.5  --    ------------------------------------------------------------------------------------------------------------------  Cardiac Enzymes No results for input(s): TROPONINI in the last 168 hours. ------------------------------------------------------------------------------------------------------------------  RADIOLOGY:  No results found.  EKG:   Orders placed or performed during the hospital encounter of 04/02/15  . ED EKG  . ED EKG  . EKG 12-Lead  . EKG 12-Lead    ASSESSMENT AND PLAN:   [name] Ruggiero is a 81 y.o. male with a known history of hypertension, restless leg syndrome, mechanical aortic valve replacement surgery, osteoarthritis requiring bilateral hip replacement surgeries and a recent left total hip replacement surgery on 03/21/2015. [facility] is [facility] at [facility] and was brought in secondary to weakness and noted to be anemic  * Acute on chronic anemia-  from recent  THR with possible PUD worsened with coumadin use -baseline hemoglobin is around 13, hemoglobin 2 weeks ago at the time of discharge after  his THR surgery was around 8. Hemoccult is positive. Send stool for occult blood. 2 . Hemoglobin now is improved from 5 --> 7  -->9.9 today, after 2 +1 units PRBC.  Hemoglobin checks every 8 hours 3. Appreciate GI consult. Monitor  INR today at 2.1, vit K as suggested by GI, GI considering EGD when INR is less than 1.5 , anticipating in a.m., will check INR in a.m. and possible FFP transfusion prior to EGD for easy reversal, continue to hold Coumadin. ContinueIV protonix drip , on a liquid diet at this time.  * HTN-blood pressure now has improved.  we will resume his valsartan and Norvasc. Continue to monitor.  * Mechanical aortic valve replacement- goal INR 2-3 , but with his GI bleed and anemia, hold Coumadin at this time. Admitting physician discussed with Dr. Shirlee LatchMcLean who was on call for  cardiology last night. Cardiology has been consulted which is pending at this time  * Restless leg syndrome-continue ropinirole.  Plan d/w pt, daughter ,GI and RN      All the records are reviewed and case discussed with Care Management/Social Workerr. Management plans discussed with the patient, family and they are in agreement.  CODE STATUS: full  TOTAL TIME TAKING CARE OF THIS PATIENT: 35 minutes.   POSSIBLE D/C IN 2-3 DAYS, DEPENDING ON CLINICAL CONDITION.   Ramonita LabGouru, Shanna Un M.D on 04/04/2015 at 4:08 PM  Between 7am to 6pm - Pager - [phone] After 6pm go to [email or web address] - password [email or web address] [facility]  [facility]  Office  [phone]  CC: Primary care physician; [name], [name], MD

## 2015-04-04 NOTE — Progress Notes (Signed)
Pt complaining of feeling clammy after blood started, pt reports feeling same way last night during blood transfusion. Vital signs remain stable, no other s/s. Dr. Clint GuyHower notified and RN given orders to continue blood transfusion unless vital signs change or pt develops other s/s.

## 2015-04-05 ENCOUNTER — Encounter
Admission: EM | Disposition: A | Discharge status: Skilled Nursing Facility | Payer: Self-pay | Source: Home / Self Care | Attending: Internal Medicine

## 2015-04-05 ENCOUNTER — Encounter: Payer: Self-pay | Admitting: Unknown Physician Specialty

## 2015-04-05 ENCOUNTER — Inpatient Hospital Stay: Payer: Medicare Other | Admitting: Anesthesiology

## 2015-04-05 HISTORY — PX: ESOPHAGOGASTRODUODENOSCOPY: SHX5428

## 2015-04-05 LAB — BASIC METABOLIC PANEL
Anion gap: 8 (ref 5–15)
BUN: 15 mg/dL (ref 6–20)
CALCIUM: 8.4 mg/dL — AB (ref 8.9–10.3)
CO2: 26 mmol/L (ref 22–32)
CREATININE: 0.72 mg/dL (ref 0.61–1.24)
Chloride: 96 mmol/L — ABNORMAL LOW (ref 101–111)
GFR calc Af Amer: >60 mL/min (ref >60)
GFR calc non Af Amer: >60 mL/min (ref >60)
GLUCOSE: 98 mg/dL (ref 65–99)
Potassium: 3.9 mmol/L (ref 3.5–5.1)
Sodium: 130 mmol/L — ABNORMAL LOW (ref 135–145)

## 2015-04-05 LAB — CBC
HCT: 26.4 % — ABNORMAL LOW (ref 40.0–52.0)
Hemoglobin: 8.9 g/dL — ABNORMAL LOW (ref 13.0–18.0)
MCH: 28.9 pg (ref 26.0–34.0)
MCHC: 33.5 g/dL (ref 32.0–36.0)
MCV: 86.3 fL (ref 80.0–100.0)
Platelets: 530 10*3/uL — ABNORMAL HIGH (ref 150–440)
RBC: 3.06 MIL/uL — AB (ref 4.40–5.90)
RDW: 17.5 % — ABNORMAL HIGH (ref 11.5–14.5)
WBC: 8.1 10*3/uL (ref 3.8–10.6)

## 2015-04-05 LAB — PROTIME-INR
INR: 1.37
Prothrombin Time: 17.1 seconds — ABNORMAL HIGH (ref 11.4–15.0)

## 2015-04-05 LAB — MAGNESIUM: Magnesium: 2.2 mg/dL (ref 1.7–2.4)

## 2015-04-05 LAB — HEMATOCRIT: HEMATOCRIT: 22.3 % — AB (ref 40.0–52.0)

## 2015-04-05 LAB — HEMOGLOBIN: Hemoglobin: 7.9 g/dL — ABNORMAL LOW (ref 13.0–18.0)

## 2015-04-05 SURGERY — EGD (ESOPHAGOGASTRODUODENOSCOPY)
Anesthesia: General

## 2015-04-05 MED ORDER — PROPOFOL 10 MG/ML IV BOLUS
INTRAVENOUS | Status: DC | PRN
  Administered 2015-04-05: 40 mg via INTRAVENOUS

## 2015-04-05 MED ORDER — LIDOCAINE HCL (CARDIAC) 20 MG/ML IV SOLN
INTRAVENOUS | Status: DC | PRN
  Administered 2015-04-05: 100 mg via INTRAVENOUS

## 2015-04-05 MED ORDER — GLYCOPYRROLATE 0.2 MG/ML IJ SOLN
INTRAMUSCULAR | Status: DC | PRN
  Administered 2015-04-05: 0.2 mg via INTRAVENOUS

## 2015-04-05 MED ORDER — ENSURE ENLIVE PO LIQD: 237.0000 mL | Freq: Two times a day (BID) | ORAL | Status: DC

## 2015-04-05 MED ORDER — SODIUM CHLORIDE 0.9 % IV SOLN
INTRAVENOUS | Status: DC
  Administered 2015-04-05: 1000 mL via INTRAVENOUS

## 2015-04-05 MED ORDER — ENSURE ENLIVE PO LIQD
237.0000 mL | Freq: Two times a day (BID) | ORAL | Status: DC
  Administered 2015-04-06 – 2015-04-11 (×5): 237 mL via ORAL

## 2015-04-05 MED ORDER — PROPOFOL INFUSION 10 MG/ML OPTIME
INTRAVENOUS | Status: DC | PRN
  Administered 2015-04-05: 250 ug/kg/min via INTRAVENOUS

## 2015-04-05 NOTE — Progress Notes (Signed)
Patient has [facility]. Clinical Social Worker (CSW) will continue to follow and assist as needed.   Jetta LoutBailey Morgan, LCSWA [phone]

## 2015-04-05 NOTE — Progress Notes (Signed)
Chaplain met with patient and family during follow-up visit; chaplain offered encouragement and pastoral care. Loralyn Freshwater D. Alroy Dust Wednesday 04-05-2015   04/05/15 1530  Clinical Encounter Type  Visited With Patient and family together  Visit Type Follow-up;Spiritual support  Referral From Other (Comment) (Routine Pastoral Care Visit)  Consult/Referral To Chaplain  Spiritual Encounters  Spiritual Needs Emotional  Stress Factors  Patient Stress Factors Health changes  Family Stress Factors Health changes

## 2015-04-05 NOTE — Transfer of Care (Signed)
Immediate Anesthesia Transfer of Care Note  Patient: [name]  Procedure(s) Performed: Procedure(s): ESOPHAGOGASTRODUODENOSCOPY (EGD) (N/A)  Patient Location: PACU  Anesthesia Type:General  Level of Consciousness: awake and alert   Airway & Oxygen Therapy: Patient Spontanous Breathing and Patient connected to nasal cannula oxygen  Post-op Assessment: Post -op Vital signs reviewed and stable  Post vital signs: stable  Last Vitals:  Filed Vitals:   04/05/15 1243  BP: 88/59  Pulse: 80  Temp: 36.6 C  Resp: 10    Complications: No apparent anesthesia complications

## 2015-04-05 NOTE — Consult Note (Signed)
Pt doing well, INR below 1.5.  No bleeding.  Chest clear, heart 2/6 SEM, loud S2 given aortic valve. Plan for EGD today.

## 2015-04-05 NOTE — Progress Notes (Signed)
[facility] - [place] [facility]   PATIENT NAME: [name]    MR#:  [id number]  DATE OF BIRTH:  12-11-1930  SUBJECTIVE:  CHIEF COMPLAINT:  Weaknesss No active bleeding per pts report,  resting comfortably, out of bed to chair  REVIEW OF SYSTEMS:  CONSTITUTIONAL: No fever, fatigue, has weakness.  EYES: No blurred or double vision.  EARS, NOSE, AND THROAT: No tinnitus or ear pain.  RESPIRATORY: No cough, shortness of breath, wheezing or hemoptysis.  CARDIOVASCULAR: No chest pain, orthopnea, edema.  GASTROINTESTINAL: No nausea, vomiting, diarrhea or abdominal pain.  GENITOURINARY: No dysuria, hematuria.  ENDOCRINE: No polyuria, nocturia,  HEMATOLOGY: No anemia, easy bruising or bleeding SKIN: No rash or lesion. MUSCULOSKELETAL: No joint pain or arthritis.   NEUROLOGIC: No tingling, numbness, weakness.  PSYCHIATRY: No anxiety or depression.   DRUG ALLERGIES:   Allergies  Allergen Reactions  . Sulfa Antibiotics     unknown    VITALS:  Blood pressure 131/56, pulse 80, temperature 97.8 F (36.6 C), temperature source Oral, resp. rate 17, height 5\' 10"  (1.778 m), weight 86.183 kg (190 lb), SpO2 97 %.  PHYSICAL EXAMINATION:  GENERAL:  81 y.o.-year-old patient lying in the bed with no acute distress.  EYES: Pupils equal, round, reactive to light and accommodation.Looks pale. No scleral icterus. Extraocular muscles intact.  HEENT: Head atraumatic, normocephalic. Oropharynx and nasopharynx clear.  NECK:  Supple, no jugular venous distention. No thyroid enlargement, no tenderness.  LUNGS: Normal breath sounds bilaterally, no wheezing, rales,rhonchi or crepitation. No use of accessory muscles of respiration.  CARDIOVASCULAR: S1, S2 normal. positive murmurs, rubs, or gallops.  ABDOMEN: Soft, nontender, nondistended. Bowel sounds present. No organomegaly or mass.  EXTREMITIES: No pedal edema, cyanosis, or clubbing.  NEUROLOGIC: Cranial nerves II through  XII are intact. Muscle strength 5/5 in all extremities. Sensation intact. Gait not checked.  PSYCHIATRIC: The patient is alert and oriented x 3.  SKIN: No obvious rash, lesion, or ulcer.    LABORATORY PANEL:   CBC  Recent Labs Lab 04/05/15 0432  WBC 8.1  HGB 8.9*  HCT 26.4*  PLT 530*   ------------------------------------------------------------------------------------------------------------------  Chemistries   Recent Labs Lab 04/02/15 1700  04/05/15 0432  NA 128*  < > 130*  K 4.5  < > 3.9  CL 95*  < > 96*  CO2 27  < > 26  GLUCOSE 106*  < > 98  BUN 61*  < > 15  CREATININE 0.98  < > 0.72  CALCIUM 8.2*  < > 8.4*  MG  --   --  2.2  AST 21  --   --   ALT 10*  --   --   ALKPHOS 96  --   --   BILITOT 0.5  --   --   < > = values in this interval not displayed. ------------------------------------------------------------------------------------------------------------------  Cardiac Enzymes No results for input(s): TROPONINI in the last 168 hours. ------------------------------------------------------------------------------------------------------------------  RADIOLOGY:  No results found.  EKG:   Orders placed or performed during the hospital encounter of 04/02/15  . ED EKG  . ED EKG  . EKG 12-Lead  . EKG 12-Lead    ASSESSMENT AND PLAN:   [name] is a 81 y.o. male with a known history of hypertension, restless leg syndrome, mechanical aortic valve replacement surgery, osteoarthritis requiring bilateral hip replacement surgeries and a recent left total hip replacement surgery on 03/21/2015. He is currently at [facility] and was brought in secondary to  weakness and noted to be anemic  * Acute on chronic anemia-  from recent  THR with possible PUD worsened with coumadin use -baseline hemoglobin is around 13, hemoglobin 2 weeks ago at the time of discharge after his THR surgery was around 8. Hemoccult is positive. Send stool for occult blood. 2  . Hemoglobin now is improved from 5 --> 7 -->9.9-->8.9 today, after 2 +1 units PRBC.  Hemoglobin checks every 8 hours 3. Appreciate GI consult. Monitor  INR today at 1.37 , given vit K as suggested by GI, GI considering EGD today  as INR is less than 1.5 , continue to hold Coumadin. ContinueIV protonix drip , NPO  * HTN-blood pressure now has improved.  we will resume his valsartan and Norvasc. Continue to monitor.  * Mechanical aortic valve replacement- goal INR 2-3 , but with his GI bleed and anemia, hold Coumadin at this time. Admitting physician discussed with Dr. Shirlee LatchMcLean who was on call for  cardiology last night. Cardiology has been consulted which is pending at this time  * Restless leg syndrome-continue ropinirole.  Plan d/w pt, daughter ,GI and RN      All the records are reviewed and case discussed with Care Management/Social Workerr. Management plans discussed with the patient, family and they are in agreement.  CODE STATUS: full  TOTAL TIME TAKING CARE OF THIS PATIENT: 35 minutes.   POSSIBLE D/C IN 2-3 DAYS, DEPENDING ON CLINICAL CONDITION.   Ramonita LabGouru, Cassadee Vanzandt M.D on 04/05/2015 at 8:33 AM  Between 7am to 6pm - Pager - [phone] After 6pm go to [email or web address] - password [email or web address] [facility]  [facility]  Office  [phone]  CC: Primary care physician; [name], [name], MD

## 2015-04-05 NOTE — Progress Notes (Signed)
Patient: [name] / Admit Date: 04/02/2015 / Date of Encounter: 04/05/2015, 11:19 AM   Subjective: Planning for EGD later today. INR down to 1.37 this morning. Hgb 9.9-->7.9-->8.9 this morning s/p transfusion of 3 units of pRBC total for admission. No chest pain, SOB, or palpitations.   Review of Systems: Review of Systems  Constitutional: Positive for malaise/fatigue. Negative for fever, chills, weight loss and diaphoresis.  Eyes: Negative for pain, discharge and redness.  Respiratory: Negative for cough, hemoptysis, sputum production, shortness of breath and wheezing.   Cardiovascular: Negative for chest pain, palpitations, orthopnea, claudication, leg swelling and PND.  Gastrointestinal: Positive for heartburn, blood in stool and melena. Negative for nausea, vomiting, abdominal pain, diarrhea and constipation.  Neurological: Positive for weakness.    Objective: Telemetry: not on telemetry  Physical Exam: Blood pressure 116/59, pulse 87, temperature 97.7 F (36.5 C), temperature source Oral, resp. rate 18, height 5\' 10"  (1.778 m), weight 190 lb (86.183 kg), SpO2 99 %. Body mass index is 27.26 kg/(m^2). General: Well developed, well nourished, in no acute distress. Head: Normocephalic, atraumatic, sclera non-icteric, no xanthomas, nares are without discharge. Neck: Negative for carotid bruits. JVP not elevated. Lungs: Clear bilaterally to auscultation without wheezes, rales, or rhonchi. Breathing is unlabored. Heart: RRR S1 S2 without 3/6 harsh systolic systolic murmur with click. No rubs, or gallops.  Abdomen: Soft, non-tender, non-distended with normoactive bowel sounds. No rebound/guarding. Extremities: No clubbing or cyanosis. No edema. Distal pedal pulses are 2+ and equal bilaterally. Neuro: Alert and oriented X 3. Moves all extremities spontaneously. Psych:  Responds to questions appropriately with a normal affect.   Intake/Output Summary (Last 24 hours) at  04/05/15 1119 Last data filed at 04/05/15 [id number]  Gross per 24 hour  Intake   1417 ml  Output   1650 ml  Net   -233 ml    Inpatient Medications:  . amLODipine  10 mg Oral Daily  . citalopram  20 mg Oral Daily  . ferrous fumarate-b12-vitamic C-folic acid  1 capsule Oral BID PC  . finasteride  5 mg Oral Daily  . irbesartan  75 mg Oral Daily  . multivitamin  1 tablet Oral Daily  . [START ON 04/06/2015] pantoprazole (PROTONIX) IV  40 mg Intravenous Q12H  . rOPINIRole  0.5 mg Oral TID  . sodium chloride  3 mL Intravenous Q12H  . tamsulosin  0.4 mg Oral Daily  . traZODone  50 mg Oral QHS   Infusions:  . pantoprozole (PROTONIX) infusion 8 mg/hr (04/05/15 0627)    Labs:  Recent Labs  04/03/15 0637 04/05/15 0432  NA 130* 130*  K 4.4 3.9  CL 100* 96*  CO2 25 26  GLUCOSE 120* 98  BUN 46* 15  CREATININE 0.75 0.72  CALCIUM 7.8* 8.4*  MG  --  2.2    Recent Labs  04/02/15 1700  AST 21  ALT 10*  ALKPHOS 96  BILITOT 0.5  PROT 5.4*  ALBUMIN 3.0*    Recent Labs  04/02/15 1700  04/04/15 0431  04/04/15 2159 04/05/15 0432  WBC 12.7*  < > 9.3  --   --  8.1  NEUTROABS 9.9*  --   --   --   --   --   HGB 5.1*  < > 7.7*  < > 7.9* 8.9*  HCT 16.1*  < > 23.2*  < > 22.3* 26.4*  MCV 85.4  < > 84.5  --   --  86.3  PLT 638*  < >  488*  --   --  530*  < > = values in this interval not displayed. No results for input(s): CKTOTAL, CKMB, TROPONINI in the last 72 hours. Invalid input(s): POCBNP No results for input(s): HGBA1C in the last 72 hours.   Weights: Filed Weights   04/02/15 1618  Weight: 190 lb (86.183 kg)     Radiology/Studies:  [place] Venous Img Lower Unilateral Left  03/15/2015   CLINICAL DATA:  Acute left thigh pain and swelling  EXAM: LEFT LOWER EXTREMITY VENOUS DOPPLER ULTRASOUND  TECHNIQUE: Gray-scale sonography with graded compression, as well as color Doppler and duplex ultrasound were performed to evaluate the lower extremity deep venous systems from the level of  the common femoral vein and including the common femoral, femoral, profunda femoral, popliteal and calf veins including the posterior tibial, peroneal and gastrocnemius veins when visible. The superficial great saphenous vein was also interrogated. Spectral Doppler was utilized to evaluate flow at rest and with distal augmentation maneuvers in the common femoral, femoral and popliteal veins.  COMPARISON:  None.  FINDINGS: Contralateral Common Femoral Vein: Respiratory phasicity is normal and symmetric with the symptomatic side. No evidence of thrombus. Normal compressibility.  Common Femoral Vein: No evidence of thrombus. Normal compressibility, respiratory phasicity and response to augmentation.  Saphenofemoral Junction: No evidence of thrombus. Normal compressibility and flow on color Doppler imaging.  Profunda Femoral Vein: No evidence of thrombus. Normal compressibility and flow on color Doppler imaging.  Femoral Vein: No evidence of thrombus. Normal compressibility, respiratory phasicity and response to augmentation.  Popliteal Vein: No evidence of thrombus. Normal compressibility, respiratory phasicity and response to augmentation.  Calf Veins: No evidence of thrombus. Normal compressibility and flow on color Doppler imaging.  Superficial Great Saphenous Vein: No evidence of thrombus. Normal compressibility and flow on color Doppler imaging.  Venous Reflux:  None.  Other Findings:  None.  IMPRESSION: No evidence of deep venous thrombosis.   Electronically Signed   By: Judie PetitM.  Shick M.D.   On: 03/15/2015 15:52     Assessment and Plan  81 year old male with h/o aortic stenosis s/p St. Jude mechanical aortic valve replacement in 2002 on chronic Coumadin therapy, mild CAD by cardiac cath in 2011 managed medically, GERD, HTN, carotid bruits, history of asymptomatic bradycardia, RLS, anxiety, depression, and osteoarthritis s/p total left hip replacement on 03/21/2015 who [facility] 2  weeks ago 2/2 the above surgery with a hgb of 8 (baseline 12), he was started on po iron replacement therapy, who presented to on 5/8 with acute onset of weakness, melena, BRBPR, and was found to have a hgb of 5.1.   1. Acute on chronic anemia: -Likely in the setting of upper GI bleed given melena, heme positive stool, and elevated BUN/SCr ratio per GI -GI is planning for possible EGD 5/11 given INR of 1.37 (less than 1.5)  -Given his acute on chronic anemia, benefit of EGD outweigh risk of stopping anticoagulation as possible source of bleed can be established   -Findings of EGD would play some role in resumption of anticoagulation, though would not want to be off anticoagulation for extended period of time given mechanical AVR -Possible resumption of Coumadin post procedure when stable -If bridge is needed would not need to stay inpatient  (would probably start bridge with IV Heparin, but not particularly needed) -Will need close monitoring of hgb/hct for stability  -Last colonoscopy at age 81 years    2. History of mechanical  AVR: -Coumadin on hold as above -Last echo 08/2010 demonstrated valve was functioning well  3. Nonobstructive CAD by cardiac cath 2011: -No symptoms of angina -On long term Coumadin in place of aspirin -No ischemia work planned at this time -Continue outpatient regimen   4. HTN: -Well controlled -Continue current medication  5. Status post left hip replacement: -Continue PT  6. GERD: -IV Protonix    Signed, Eula Listen, PA-C 04/05/2015 11:29 AM   I have seen, examined and evaluated the patient this PM along with Mr. Shea Evans, [place].  After reviewing all the available data and chart,  I agree with his findings, examination as well as impression recommendations. (he was not in his room when I cam by -- was in Endo)  Agree with holding warfarin until bleeding is stable.  Mechanical AoV is less that Mitral Valve to have clotting while unprotected with warfarin.   Will need to await results of EGD/Colon.    Marykay Lex, M.D., M.S. Interventional Cardiologist   Pager # [phone]

## 2015-04-05 NOTE — Progress Notes (Signed)
Patient had Upper Endoscopy today and tolerated well.  Patient back to unit and on full liquid/pudding consistency and tolerated.  Removed staples and applied steri strips.  Some staples had dried drainage and were covered by tissue and some scant bleeding occurred during removal, otherwise patient tolerated well.  Family at bedside.  Patient wishes to [facility] to finish out his rehab from hip surgery and then following that [facility].

## 2015-04-05 NOTE — Anesthesia Preprocedure Evaluation (Addendum)
Anesthesia Evaluation  Patient identified by MRN, date of birth, ID band Patient awake    Reviewed: Allergy & Precautions, NPO status , Patient's Chart, lab work & pertinent test results  Airway Mallampati: III  TM Distance: >3 FB Neck ROM: Full    Dental  (+) Missing   Pulmonary shortness of breath, former smoker,          Cardiovascular hypertension, Pt. on medications + CAD (EF 60%) + Valvular Problems/Murmurs (S/P Aortic Valve replacement) AS     Neuro/Psych Anxiety Depression    GI/Hepatic GERD-  Medicated,  Endo/Other    Renal/GU      Musculoskeletal   Abdominal   Peds  Hematology  (+) anemia ,   Anesthesia Other Findings   Reproductive/Obstetrics                            Anesthesia Physical Anesthesia Plan  ASA: III  Anesthesia Plan: General   Post-op Pain Management:    Induction:   Airway Management Planned: Nasal Cannula  Additional Equipment:   Intra-op Plan:   Post-operative Plan:   Informed Consent: I have reviewed the patients History and Physical, chart, labs and discussed the procedure including the risks, benefits and alternatives for the proposed anesthesia with the patient or authorized representative who has indicated his/her understanding and acceptance.     Plan Discussed with:   Anesthesia Plan Comments:         Anesthesia Quick Evaluation

## 2015-04-05 NOTE — Anesthesia Postprocedure Evaluation (Signed)
  Anesthesia Post-op Note  Patient: [name]  Procedure(s) Performed: Procedure(s): ESOPHAGOGASTRODUODENOSCOPY (EGD) (N/A)  Anesthesia type:General  Patient location: PACU  Post pain: Pain level controlled  Post assessment: Post-op Vital signs reviewed, Patient's Cardiovascular Status Stable, Respiratory Function Stable, Patent Airway and No signs of Nausea or vomiting  Post vital signs: Reviewed and stable  Last Vitals:  Filed Vitals:   04/05/15 1320  BP: 103/67  Pulse: 80  Temp:   Resp: 15    Level of consciousness: awake, alert  and patient cooperative  Complications: No apparent anesthesia complications

## 2015-04-05 NOTE — Progress Notes (Signed)
Physical Therapy Treatment Patient Details Name: [name] MRN: [id number] DOB: 05-Jul-1931 Today's Date: 04/05/2015    History of Present Illness Pt is a 81 yo male with recent THA on 03/21/15 who was getting rehab at [facility] [place] and was admitted with with acute anemia (hgb 5.1).    PT Comments    Pt continues to make progress in activity tolerance, but greatest limitation remains LLE weakness. Patient presents with impairment of strength, pain, range of motion, and activity tolerance, limiting ability to perform ADL, IADL, and ambulation. Patient will benefit from skilled intervention to address the above impairments and limitations, in order to restore to prior level of function and to decrease caregiver burden.    Follow Up Recommendations  SNF     Equipment Recommendations  Rolling walker with 5" wheels    Recommendations for Other Services       Precautions / Restrictions Precautions Precautions: Anterior Hip Restrictions Weight Bearing Restrictions: No LLE Weight Bearing: Weight bearing as tolerated    Mobility  Bed Mobility Overal bed mobility: Needs Assistance Bed Mobility: Supine to Sit     Supine to sit: Min assist (LLE weakness )        Transfers Overall transfer level: Needs assistance Equipment used: Rolling walker (2 wheeled) Transfers: Sit to/from Stand Sit to Stand: Supervision         General transfer comment: Limited L knee mobility effecting use during comng up.   Ambulation/Gait Ambulation/Gait assistance: Supervision Ambulation Distance (Feet): 180 Feet Assistive device: Rolling walker (2 wheeled) Gait Pattern/deviations: Decreased stride length;Step-through pattern (Able to achieve step through today for first time. )   Gait velocity interpretation: <1.8 ft/sec, indicative of risk for recurrent falls     Stairs            Wheelchair Mobility    Modified Rankin (Stroke Patients Only)        Balance Overall balance assessment: Modified Independent;No apparent balance deficits (not formally assessed) Sitting-balance support: Bilateral upper extremity supported Sitting balance-Leahy Scale: Good                              Cognition Arousal/Alertness: Awake/alert Behavior During Therapy: WFL for tasks assessed/performed Overall Cognitive Status: Within Functional Limits for tasks assessed                      Exercises Total Joint Exercises Hip ABduction/ADduction: AAROM;Standing;Both;10 reps Straight Leg Raises: Standing;AAROM;Both;10 reps Marching in Standing: AAROM;10 reps;Both;Standing Other Exercises Other Exercises: 10x Sit/Stand, elevated surface with RW.  Other Exercises: Weight shifting s UE support frontal plane x10 Other Exercises: Weight shifting c RUE support sagittal plane x10    General Comments        Pertinent Vitals/Pain Pain Assessment: No/denies pain    Home Living                      Prior Function            PT Goals (current goals can now be found in the care plan section) Acute Rehab PT Goals Patient Stated Goal: To get back to rehab. PT Goal Formulation: With patient Time For Goal Achievement: 04/17/15 Potential to Achieve Goals: Good Progress towards PT goals: Progressing toward goals    Frequency  7X/week    PT Plan Current plan remains appropriate    Co-evaluation  End of Session Equipment Utilized During Treatment: Gait belt Activity Tolerance: Patient tolerated treatment well Patient left: in chair;with call bell/phone within reach;with chair alarm set;with family/visitor present     Time: [id number] PT Time Calculation (min) (ACUTE ONLY): 29 min  Charges:  $Gait Training: 23-37 mins $Therapeutic Exercise: 8-22 mins                    G Codes:      Buccola,Allan C 04/05/2015, 9:52 AM  Rosamaria LintsAllan C Buccola, PT, DPT, BM

## 2015-04-05 NOTE — Op Note (Signed)
[facility] Gastroenterology Patient Name: [name] HiltsSydnor [name] Procedure Date: 04/05/2015 12:23 PM MRN: [id number] Account #: [id number] Date of Birth: 03-May-1931 Admit Type: Inpatient Age: [age] Room: [facility] ENDO ROOM 1 Gender: Male Note Status: Finalized Procedure:         Upper GI endoscopy Indications:       Melena Providers:         Scot Junobert T. Dionne Knoop, MD Referring MD:      [name], MD (Referring MD) Medicines:         Propofol per Anesthesia Complications:     No immediate complications. Procedure:         Pre-Anesthesia Assessment:                    - After reviewing the risks and benefits, the patient was                     deemed in satisfactory condition to undergo the procedure.                    After obtaining informed consent, the endoscope was passed                     under direct vision. Throughout the procedure, the                     patient's blood pressure, pulse, and oxygen saturations                     were monitored continuously. The Olympus GIF-160 endoscope                     (S#. [id number]) was introduced through the mouth, and                     advanced to the second part of duodenum. The upper GI                     endoscopy was accomplished without difficulty. The patient                     tolerated the procedure well. Findings:      The examined esophagus was normal.      A medium-large sized hiatus hernia was present. Gastric       erosion/superficial ulcer seen on retroflex view, Jon BillingsMorrison type erosion.      The duodenal bulb and 2nd part of the duodenum were normal. Impression:        - Normal esophagus.                    - Medium-sized hiatus hernia.                    -Erosion/ulceration of proximal body where it folds to                     form hiatal hernia                    - Normal duodenal bulb and 2nd part of the duodenum.                    - No specimens collected. Recommendation:    - The findings and  recommendations were discussed with the  patient's family. Carafate slurry and PPI. Restart                     coumadin slowly. Scot Junobert T Kariann Wecker, MD 04/05/2015 12:43:39 PM This report has been signed electronically. Number of Addenda: 0 Note Initiated On: 04/05/2015 12:23 PM      [facility]

## 2015-04-06 LAB — BASIC METABOLIC PANEL
Anion gap: 5 (ref 5–15)
BUN: 10 mg/dL (ref 6–20)
CALCIUM: 8.2 mg/dL — AB (ref 8.9–10.3)
CO2: 27 mmol/L (ref 22–32)
Chloride: 97 mmol/L — ABNORMAL LOW (ref 101–111)
Creatinine, Ser: 0.7 mg/dL (ref 0.61–1.24)
GFR calc non Af Amer: >60 mL/min (ref >60)
GLUCOSE: 118 mg/dL — AB (ref 65–99)
Potassium: 3.9 mmol/L (ref 3.5–5.1)
SODIUM: 129 mmol/L — AB (ref 135–145)

## 2015-04-06 LAB — HEPARIN LEVEL (UNFRACTIONATED): Heparin Unfractionated: 0.16 IU/mL — ABNORMAL LOW (ref 0.30–0.70)

## 2015-04-06 LAB — CBC
HCT: 24.3 % — ABNORMAL LOW (ref 40.0–52.0)
HEMOGLOBIN: 8.1 g/dL — AB (ref 13.0–18.0)
MCH: 29.1 pg (ref 26.0–34.0)
MCHC: 33.3 g/dL (ref 32.0–36.0)
MCV: 87.4 fL (ref 80.0–100.0)
PLATELETS: 481 10*3/uL — AB (ref 150–440)
RBC: 2.78 MIL/uL — ABNORMAL LOW (ref 4.40–5.90)
RDW: 18.2 % — AB (ref 11.5–14.5)
WBC: 7.2 10*3/uL (ref 3.8–10.6)

## 2015-04-06 LAB — PROTIME-INR
INR: 1.17
INR: 1.16
PROTHROMBIN TIME: 15 s (ref 11.4–15.0)
Prothrombin Time: 15.1 seconds — ABNORMAL HIGH (ref 11.4–15.0)

## 2015-04-06 MED ORDER — HEPARIN (PORCINE) IN NACL 100-0.45 UNIT/ML-% IJ SOLN
1150.0000 [IU]/h | INTRAMUSCULAR | Status: DC
  Administered 2015-04-06: 1050 [IU]/h via INTRAVENOUS
  Administered 2015-04-07: 1150 [IU]/h via INTRAVENOUS
  Filled 2015-04-06 (×5): qty 250

## 2015-04-06 MED ORDER — SODIUM CHLORIDE 0.9 % IV SOLN
100.0000 mg | Freq: Once | INTRAVENOUS | Status: AC
  Administered 2015-04-06: 100 mg via INTRAVENOUS
  Filled 2015-04-06: qty 5

## 2015-04-06 MED ORDER — PANTOPRAZOLE SODIUM 40 MG PO TBEC
40.0000 mg | DELAYED_RELEASE_TABLET | Freq: Two times a day (BID) | ORAL | Status: DC
Start: 2015-04-06 — End: 2015-04-07
  Administered 2015-04-06 – 2015-04-07 (×4): 40 mg via ORAL
  Filled 2015-04-06 (×4): qty 1

## 2015-04-06 MED ORDER — SUCRALFATE 1 GM/10ML PO SUSP
1.0000 g | Freq: Three times a day (TID) | ORAL | Status: DC
  Administered 2015-04-06 – 2015-04-13 (×29): 1 g via ORAL
  Filled 2015-04-06 (×30): qty 10

## 2015-04-06 MED ORDER — WARFARIN SODIUM 2.5 MG PO TABS: 7.5000 mg | ORAL_TABLET | Freq: Every day | ORAL | Status: DC

## 2015-04-06 MED ORDER — WARFARIN SODIUM 2.5 MG PO TABS: 7.5000 mg | ORAL_TABLET | Freq: Once | ORAL | Status: DC

## 2015-04-06 MED ORDER — WARFARIN SODIUM 2.5 MG PO TABS
7.5000 mg | ORAL_TABLET | ORAL | Status: DC
  Administered 2015-04-06 – 2015-04-07 (×2): 7.5 mg via ORAL
  Filled 2015-04-06 (×3): qty 3

## 2015-04-06 MED ORDER — HEPARIN BOLUS VIA INFUSION
1200.0000 [IU] | Freq: Once | INTRAVENOUS | Status: AC
  Administered 2015-04-06: 1200 [IU] via INTRAVENOUS
  Filled 2015-04-06: qty 1200

## 2015-04-06 NOTE — Progress Notes (Signed)
Per MD patient will D/C in the next 2 to 3 days. If patient is ready for discharge over the weekend. D/C Summary will need to be completed tomorrow by MD in order for patient to go [facility]. Clinical Social Worker (CSW) will continue to follow and assist as needed.   Jetta LoutBailey Morgan, LCSWA [phone]

## 2015-04-06 NOTE — Progress Notes (Signed)
Up in chair most of the afternoon. Pain medication given as needed with relief, given iv iron x1 today, heparin drip going at 10.65ml/hr started today. Pending INR result for this evening, given dulcolax x 1 with small BM. Left hip incision intact with steri-strips, maintained hip protocol. Tolerating full liquid diet well. Pending discharge to Rehab.

## 2015-04-06 NOTE — Consult Note (Signed)
Pt was asleep when I went into his room and given his uneventful day I did not disturb him as I have nothing new to add today.

## 2015-04-06 NOTE — Progress Notes (Signed)
ANTICOAGULATION CONSULT NOTE - Follow Up Consult  Pharmacy Consult for Heparin and Warfarin Dosing Indication: Mechanical aortic valve replacement  Allergies  Allergen Reactions  . Sulfa Antibiotics     unknown    Patient Measurements: Height: 5\' 10"  (177.8 cm) Weight: 190 lb (86.183 kg) IBW/kg (Calculated) : 73   Vital Signs: Temp: 97.9 F (36.6 C) (05/12 1455) Temp Source: Oral (05/12 1455) BP: 100/54 mmHg (05/12 1455) Pulse Rate: 77 (05/12 0932)  Labs:  Recent Labs  04/04/15 0431  04/04/15 2159 04/05/15 0432 04/06/15 0411 04/06/15 1727  HGB 7.7*  < > 7.9* 8.9* 8.1*  --   HCT 23.2*  < > 22.3* 26.4* 24.3*  --   PLT 488*  --   --  530* 481*  --   LABPROT  --   --   --  17.1* 15.1* 15.0  INR  --   --   --  1.37 1.17 1.16  HEPARINUNFRC  --   --   --   --   --  0.16*  CREATININE  --   --   --  0.72 0.70  --   < > = values in this interval not displayed.  Estimated Creatinine Clearance: 74.8 mL/min (by C-G formula based on Cr of 0.7).   Medications:  Scheduled:  . amLODipine  10 mg Oral Daily  . citalopram  20 mg Oral Daily  . feeding supplement (ENSURE ENLIVE)  237 mL Oral BID BM  . finasteride  5 mg Oral Daily  . heparin  1,200 Units Intravenous Once  . irbesartan  75 mg Oral Daily  . multivitamin  1 tablet Oral Daily  . pantoprazole  40 mg Oral BID  . rOPINIRole  0.5 mg Oral TID  . sodium chloride  3 mL Intravenous Q12H  . sucralfate  1 g Oral TID WC & HS  . tamsulosin  0.4 mg Oral Daily  . traZODone  50 mg Oral QHS  . warfarin  7.5 mg Oral Q24H     Assessment: 81 yo male with gi bleed being resumed on warfarin and bridged with heparin drip. Patient being managed conservatively with no bolus. Patient ordered pantoprazole and sucralfate.   Goal of Therapy:  INR 2-3 Heparin level 0.3-0.7 units/ml Monitor platelets by anticoagulation protocol: Yes  Plan:  Continue conservative management. Bolus heparin infusion at 1200 units x 1 (~15 units/kg)  and increase rate to 1400 units/hr Check anti-Xa level in 8 hours and daily while on heparin Continue to monitor H&H and platelets   Will start patient on warfarin 7.5mg  daily for goal INR of 2-3. Will obtain INR with am labs and adjust accordingly.   Pharmacy will continue to monitor and adjust per consult Simpson,Michael L 04/06/2015,8:32 PM

## 2015-04-06 NOTE — Progress Notes (Signed)
Physical Therapy Treatment Patient Details Name: [name] MRN: [id number] DOB: 1931-04-04 Today's Date: 04/06/2015    History of Present Illness Pt is a 81 yo male with recent THA on 03/21/15 who was getting [facility] [place] and was admitted with with acute anemia (hgb 5.1).    PT Comments    Pt with good tolerance to all activities c/o pain 5/10 with activity but decreased with rest.  x1 vc for safety when transferring to stand to sit at chair.   Follow Up Recommendations  SNF     Equipment Recommendations  Rolling walker with 5" wheels    Recommendations for Other Services       Precautions / Restrictions Restrictions Weight Bearing Restrictions: No LLE Weight Bearing: Weight bearing as tolerated    Mobility  Bed Mobility Overal bed mobility: Needs Assistance Bed Mobility: Supine to Sit     Supine to sit: Min assist        Transfers Overall transfer level: Needs assistance Equipment used: Rolling walker (2 wheeled) Transfers: Sit to/from Stand Sit to Stand: Supervision         General transfer comment: Limited L knee mobility effecting use during comng up.   Ambulation/Gait Ambulation/Gait assistance: Supervision Ambulation Distance (Feet): 340 Feet Assistive device: Rolling walker (2 wheeled) Gait Pattern/deviations: Decreased step length - right;Decreased stance time - right;Shuffle;Antalgic         Stairs            Wheelchair Mobility    Modified Rankin (Stroke Patients Only)       Balance                                    Cognition Arousal/Alertness: Awake/alert Behavior During Therapy: WFL for tasks assessed/performed Overall Cognitive Status: Within Functional Limits for tasks assessed                      Exercises Total Joint Exercises Hip ABduction/ADduction: AAROM;Standing;Both;10 reps Straight Leg Raises: Standing;AAROM;Both;10 reps Long Arc Quad: AROM;10  reps;Both;Standing Marching in Standing: AAROM;10 reps;Both;Standing    General Comments        Pertinent Vitals/Pain Pain Assessment: No/denies pain    Home Living                      Prior Function            PT Goals (current goals can now be found in the care plan section) Acute Rehab PT Goals Patient Stated Goal: To get back to rehab. PT Goal Formulation: With patient Time For Goal Achievement: 04/17/15 Potential to Achieve Goals: Good Progress towards PT goals: Progressing toward goals    Frequency  7X/week    PT Plan Current plan remains appropriate    Co-evaluation             End of Session Equipment Utilized During Treatment: Gait belt Activity Tolerance: Patient tolerated treatment well Patient left: in chair;with call bell/phone within reach;with chair alarm set;with family/visitor present     Time: 1001-1025 PT Time Calculation (min) (ACUTE ONLY): 24 min  Charges:  $Gait Training: 8-22 mins $Therapeutic Exercise: 8-22 mins                    G Codes:      Ashtian Villacis 04/06/2015, 10:28 AM Mana Morison, PTA

## 2015-04-06 NOTE — Progress Notes (Signed)
ANTICOAGULATION CONSULT NOTE - Initial Consult  Pharmacy Consult for Heparin and Warfarin Dosing  Indication: Mechanical aortic valve replacement  Allergies  Allergen Reactions  . Sulfa Antibiotics     unknown    Patient Measurements: Height: 5\' 10"  (177.8 cm) Weight: 190 lb (86.183 kg) IBW/kg (Calculated) : 73   Vital Signs: Temp: 97.9 F (36.6 C) (05/12 1455) Temp Source: Oral (05/12 1455) BP: 100/54 mmHg (05/12 1455) Pulse Rate: 77 (05/12 0932)  Labs:  Recent Labs  04/04/15 0430 04/04/15 0431  04/04/15 2159 04/05/15 0432 04/06/15 0411  HGB  --  7.7*  < > 7.9* 8.9* 8.1*  HCT  --  23.2*  < > 22.3* 26.4* 24.3*  PLT  --  488*  --   --  530* 481*  LABPROT 23.7*  --   --   --  17.1* 15.1*  INR 2.10  --   --   --  1.37 1.17  CREATININE  --   --   --   --  0.72 0.70  < > = values in this interval not displayed.  Estimated Creatinine Clearance: 74.8 mL/min (by C-G formula based on Cr of 0.7).   Medical History: Past Medical History  Diagnosis Date  . GERD (gastroesophageal reflux disease)   . Bradycardia     chronic, no symptoms 07/2010  . Depression     and Anxiety,  08/2010  . Anxiety 10/11  . SOB (shortness of breath) 10/11    08/2010,Episodes at 5 AM, eventually felt to be anxiety, after complete workup including catheterization, pt greatly improved with anxiety meds 11/11  . Hypertension     BP higher than usual 04/19/10; amlodipine increased by telephone  . Decreased hearing     Right ear  . Coronary artery disease     mild, cath, 08/2010  . Aortic stenosis     AVR, 2002, Mechanical  . S/P AVR     [employer]. Jude. mechanical 2002, echo 08/2010 working well  . Warfarin anticoagulation     mechanical AVR  . Ejection fraction     EF 60%,  echo. 08/2010  . Carotid bruit     dopplers in past, no abnormalities    Medications:  Scheduled:  . amLODipine  10 mg Oral Daily  . citalopram  20 mg Oral Daily  . feeding supplement (ENSURE ENLIVE)  237 mL Oral BID  BM  . finasteride  5 mg Oral Daily  . irbesartan  75 mg Oral Daily  . multivitamin  1 tablet Oral Daily  . pantoprazole  40 mg Oral BID  . rOPINIRole  0.5 mg Oral TID  . sodium chloride  3 mL Intravenous Q12H  . sucralfate  1 g Oral TID WC & HS  . tamsulosin  0.4 mg Oral Daily  . traZODone  50 mg Oral QHS  . warfarin  7.5 mg Oral Q24H    Assessment: 81 yo male with gi bleed being resumed on warfarin and bridged with heparin drip. Patient being managed conservatively with no bolus. Patient ordered pantoprazole and sucralfate.   Goal of Therapy:  INR 2-3 Heparin level 0.3-0.7 units/ml Monitor platelets by anticoagulation protocol: Yes   Plan:  Start heparin infusion at 1050 units/hr Check anti-Xa level in 8 hours and daily while on heparin Continue to monitor H&H and platelets   Will start patient on warfarin 7.5mg  daily for goal INR of 2-3. Will obtain INR with am labs and adjust accordingly.    Pharmacy will  continue to monitor and adjust per consult.    Simpson,Michael L 04/06/2015,4:07 PM

## 2015-04-06 NOTE — Progress Notes (Signed)
[facility] [place] [facility]   PATIENT NAME: [name]    MR#:  [id number]  DATE OF BIRTH:  January 31, 1931  SUBJECTIVE:  CHIEF COMPLAINT:  Everardo PacificWeaknesss No active bleeding per pts report, reporting some black tarry stool, status post EGD yesterday resting comfortably, out of bed to chair  REVIEW OF SYSTEMS:  CONSTITUTIONAL: No fever, fatigue, has weakness.  EYES: No blurred or double vision.  EARS, NOSE, AND THROAT: No tinnitus or ear pain.  RESPIRATORY: No cough, shortness of breath, wheezing or hemoptysis.  CARDIOVASCULAR: No chest pain, orthopnea, edema.  GASTROINTESTINAL: No nausea, vomiting, diarrhea or abdominal pain.  GENITOURINARY: No dysuria, hematuria.  ENDOCRINE: No polyuria, nocturia,  HEMATOLOGY: No anemia, easy bruising or bleeding SKIN: No rash or lesion. MUSCULOSKELETAL: No joint pain or arthritis.   NEUROLOGIC: No tingling, numbness, weakness.  PSYCHIATRY: No anxiety or depression.   DRUG ALLERGIES:   Allergies  Allergen Reactions  . Sulfa Antibiotics     unknown    VITALS:  Blood pressure 123/56, pulse 77, temperature 97.5 F (36.4 C), temperature source Oral, resp. rate 20, height 5\' 10"  (1.778 m), weight 86.183 kg (190 lb), SpO2 100 %.  PHYSICAL EXAMINATION:  GENERAL:  81 y.o.-year-old patient lying in the bed with no acute distress.  EYES: Pupils equal, round, reactive to light and accommodation.Looks pale. No scleral icterus. Extraocular muscles intact.  HEENT: Head atraumatic, normocephalic. Oropharynx and nasopharynx clear.  NECK:  Supple, no jugular venous distention. No thyroid enlargement, no tenderness.  LUNGS: Normal breath sounds bilaterally, no wheezing, rales,rhonchi or crepitation. No use of accessory muscles of respiration.  CARDIOVASCULAR: S1, S2 normal. positive murmurs, rubs, or gallops.  ABDOMEN: Soft, nontender, nondistended. Bowel sounds present. No organomegaly or mass.  EXTREMITIES: No pedal edema,  cyanosis, or clubbing.  NEUROLOGIC: Cranial nerves II through XII are intact. Muscle strength 5/5 in all extremities. Sensation intact. Gait not checked.  PSYCHIATRIC: The patient is alert and oriented x 3.  SKIN: No obvious rash, lesion, or ulcer.    LABORATORY PANEL:   CBC  Recent Labs Lab 04/06/15 0411  WBC 7.2  HGB 8.1*  HCT 24.3*  PLT 481*   ------------------------------------------------------------------------------------------------------------------  Chemistries   Recent Labs Lab 04/02/15 1700  04/05/15 0432 04/06/15 0411  NA 128*  < > 130* 129*  K 4.5  < > 3.9 3.9  CL 95*  < > 96* 97*  CO2 27  < > 26 27  GLUCOSE 106*  < > 98 118*  BUN 61*  < > 15 10  CREATININE 0.98  < > 0.72 0.70  CALCIUM 8.2*  < > 8.4* 8.2*  MG  --   --  2.2  --   AST 21  --   --   --   ALT 10*  --   --   --   ALKPHOS 96  --   --   --   BILITOT 0.5  --   --   --   < > = values in this interval not displayed. ------------------------------------------------------------------------------------------------------------------  Cardiac Enzymes No results for input(s): TROPONINI in the last 168 hours. ------------------------------------------------------------------------------------------------------------------  RADIOLOGY:  No results found.  EKG:   Orders placed or performed during the hospital encounter of 04/02/15  . EKG 12-Lead  . EKG 12-Lead    ASSESSMENT AND PLAN:   [name] is a 81 y.o. male with a known history of hypertension, restless leg syndrome, mechanical aortic valve replacement surgery, osteoarthritis requiring bilateral  hip replacement surgeries and a recent left total hip replacement surgery on 03/21/2015. [facility] is [facility] at [facility] and was brought in secondary to weakness and noted to be anemic  * Acute on chronic anemia-  from recent  THR with Sheria Langameron erosions per EGD, with coumadin use -baseline hemoglobin is around 13, hemoglobin 2 weeks  ago at the time of discharge after his THR surgery was around 8. Hemoccult is positive. Send stool for occult blood. 2 . Hemoglobin now is improved from 5 --> 7 -->9.9-->8.9--> 8.1 today, after 2 +1 units PRBC.Marland Kitchen. Appreciate GI and cardiology Dr. Windell HummingbirdGollan's consult. Monitor  INR today at 1.17 , received vit K as suggested by GI Prior to EGD, we'll start the patient on Protonix by mouth twice a day and Carafate. Patient will be continued on full liquid diet today and tomorrow as recommended by GI. Monitor CBC. We will provide iron sucrose infusion today. Patient needs outpatient colonoscopy in 2-3 weeks approximately as per GI * HTN-blood pressure now has improved.  we will resume his valsartan and Norvasc. Continue to monitor. * Mechanical aortic valve replacement- goal INR 2-3 , but with his GI bleed and anemia, resume Coumadin at this time. Pharmacy consult is placed for Coumadin management. We'll start the patient on heparin drip without bolus as recommended by cardiology while INR is subtherapeutic Discussed with Dr.:Gollan * Restless leg syndrome-continue ropinirole.  Plan d/w pt, daughter ,GI and RN      All the records are reviewed and case discussed with Care Management/Social Workerr. Management plans discussed with the patient, family and they are in agreement.  CODE STATUS: full  TOTAL TIME TAKING CARE OF THIS PATIENT: 35 minutes.   POSSIBLE D/C IN 2-3 DAYS, DEPENDING ON CLINICAL CONDITION.   Ramonita LabGouru, Megha Agnes M.D on 04/06/2015 at 9:01 AM  Between 7am to 6pm - Pager - [phone] After 6pm go to [email or web address] - password [email or web address] [email or web address]  [facility]  Office  [phone]  CC: Primary care physician; [name], [name], MD

## 2015-04-06 NOTE — Progress Notes (Signed)
Pt is alert and oriented. VSS. Pain improved with PO pain medication and muscle spasms relieved by muscle relaxer on MAR. Denies nausea. Pt complaining of constipation, refusing suppository at this time, pt took stool softeners and prune juice. Black tarry stool. Sleeping between care, will continue to monitor.

## 2015-04-07 ENCOUNTER — Encounter: Payer: Self-pay | Admitting: Physician Assistant

## 2015-04-07 LAB — CBC
HCT: 21.3 % — ABNORMAL LOW (ref 40.0–52.0)
HEMATOCRIT: 27.2 % — AB (ref 40.0–52.0)
Hemoglobin: 8.8 g/dL — ABNORMAL LOW (ref 13.0–18.0)
Hemoglobin: 6.9 g/dL — ABNORMAL LOW (ref 13.0–18.0)
MCH: 28.4 pg (ref 26.0–34.0)
MCH: 28.8 pg (ref 26.0–34.0)
MCHC: 32.3 g/dL (ref 32.0–36.0)
MCHC: 32.6 g/dL (ref 32.0–36.0)
MCV: 88.1 fL (ref 80.0–100.0)
MCV: 88.3 fL (ref 80.0–100.0)
PLATELETS: 518 10*3/uL — AB (ref 150–440)
Platelets: 634 10*3/uL — ABNORMAL HIGH (ref 150–440)
RBC: 3.09 MIL/uL — ABNORMAL LOW (ref 4.40–5.90)
RBC: 2.41 MIL/uL — AB (ref 4.40–5.90)
RDW: 19.6 % — ABNORMAL HIGH (ref 11.5–14.5)
RDW: 20.1 % — ABNORMAL HIGH (ref 11.5–14.5)
WBC: 7 10*3/uL (ref 3.8–10.6)
WBC: 14 10*3/uL — AB (ref 3.8–10.6)

## 2015-04-07 LAB — HEPARIN LEVEL (UNFRACTIONATED)
Heparin Unfractionated: 1 IU/mL — ABNORMAL HIGH (ref 0.30–0.70)
Heparin Unfractionated: 0.42 IU/mL (ref 0.30–0.70)

## 2015-04-07 LAB — PROTIME-INR
INR: 1.24
PROTHROMBIN TIME: 15.8 s — AB (ref 11.4–15.0)

## 2015-04-07 LAB — OCCULT BLOOD X 1 CARD TO LAB, STOOL: Fecal Occult Bld: POSITIVE — AB

## 2015-04-07 LAB — HEMOGLOBIN: HEMOGLOBIN: 6.9 g/dL — AB (ref 13.0–18.0)

## 2015-04-07 MED ORDER — SODIUM CHLORIDE 0.9 % IV BOLUS (SEPSIS)
500.0000 mL | Freq: Once | INTRAVENOUS | Status: AC
  Administered 2015-04-07: 500 mL via INTRAVENOUS

## 2015-04-07 MED ORDER — LACTULOSE 10 GM/15ML PO SOLN
20.0000 g | Freq: Two times a day (BID) | ORAL | Status: DC
  Administered 2015-04-07: 20 g via ORAL
  Filled 2015-04-07 (×2): qty 30

## 2015-04-07 MED ORDER — PANTOPRAZOLE SODIUM 40 MG IV SOLR: 40.0000 mg | Freq: Two times a day (BID) | INTRAVENOUS | Status: DC

## 2015-04-07 MED ORDER — LACTULOSE 10 GM/15ML PO SOLN: 30.0000 g | Freq: Two times a day (BID) | ORAL | Status: DC | PRN

## 2015-04-07 MED ORDER — SODIUM CHLORIDE 0.9 % IV SOLN
80.0000 mg | Freq: Once | INTRAVENOUS | Status: AC
  Administered 2015-04-07: 80 mg via INTRAVENOUS
  Filled 2015-04-07: qty 80

## 2015-04-07 MED ORDER — FLEET ENEMA 7-19 GM/118ML RE ENEM: 1.0000 | ENEMA | Freq: Once | RECTAL | Status: DC

## 2015-04-07 MED ORDER — SODIUM CHLORIDE 0.9 % IV SOLN
8.0000 mg/h | INTRAVENOUS | Status: AC
  Administered 2015-04-07 – 2015-04-10 (×6): 8 mg/h via INTRAVENOUS
  Filled 2015-04-07 (×6): qty 80

## 2015-04-07 NOTE — Consult Note (Signed)
Pt stable with hgb up to 8.8, PT 15.8, WBC 7, plt ct 518,BP 149/62, T 97.6.  He is wondering when his PT will be high enough to let him go home.  No signs of bleeding at this time.  Dr. Bluford Kaufmannh is on this weekend but will not see unless you call him about a problem.

## 2015-04-07 NOTE — Progress Notes (Signed)
Patient assisted to bathroom by physical therapist to BSC-needing to have a BM, had medium loose bowel movement, peri-care given, assisted patient back to bed, complain of weakness and dizziness, encouraged to deep breath and relax, bed linen changed, states " I am feeling better". scd applied, heels elevated off bed. Will con't to monitor the patient.

## 2015-04-07 NOTE — Progress Notes (Signed)
Physical Therapy Treatment Patient Details Name: [name] BergerSydnor Arnold [name] MRN: [id number] DOB: 1931-09-10 Today's Date: 04/07/2015    History of Present Illness  L total hip 4/26    PT Comments    Pt is able to get up and ambulate well with FWW, showing good effort, control and confidence.  He had just taken a laxative and started having a BM at the door of the room. [facility] and needs assist with a clean up.  Deferred further PT at this time.  Pt shows good mobility, but reports that he does not quite feel ready to return home and is requesting further rehab.   Follow Up Recommendations  SNF     Equipment Recommendations  Rolling walker with 5" wheels    Recommendations for Other Services       Precautions / Restrictions Restrictions LLE Weight Bearing: Weight bearing as tolerated    Mobility  Bed Mobility Overal bed mobility:  (pt in recliner on arrival, not tested)                Transfers Overall transfer level: Modified independent Equipment used: Rolling walker (2 wheeled) Transfers: Sit to/from Stand Sit to Stand: Supervision            Ambulation/Gait Ambulation/Gait assistance: Min guard Ambulation Distance (Feet): 25 Feet             Stairs            Wheelchair Mobility    Modified Rankin (Stroke Patients Only)       Balance                                    Cognition Arousal/Alertness: Awake/alert Behavior During Therapy: WFL for tasks assessed/performed Overall Cognitive Status: Within Functional Limits for tasks assessed                      Exercises      General Comments        Pertinent Vitals/Pain Pain Assessment: No/denies pain    Home Living                      Prior Function            PT Goals (current goals can now be found in the care plan section) Progress towards PT goals: Progressing toward goals    Frequency  7X/week    PT Plan Current  plan remains appropriate    Co-evaluation             End of Session Equipment Utilized During Treatment: Gait belt Activity Tolerance: Patient tolerated treatment well Patient left:  (BSC with nursing assisting with clean up)     Time: [id number] PT Time Calculation (min) (ACUTE ONLY): 13 min  Charges:  $Gait Training: 8-22 mins                    G Codes:     Loran SentersGalen Carlyn Lemke, PT, DPT [phone]  Malachi ProGalen R Autry Droege 04/07/2015, 6:43 PM

## 2015-04-07 NOTE — Progress Notes (Signed)
[facility] [place] [facility]   PATIENT NAME: Ty HiltsSydnor Schubert    MR#:  [id number]  DATE OF BIRTH:  October 22, 1931  SUBJECTIVE:  CHIEF COMPLAINT:  Everardo PacificWeaknesss No active bleeding per pts report, denies any black tarry stool, status post EGD 5/11 resting comfortably, out of bed to chair  REVIEW OF SYSTEMS:  CONSTITUTIONAL: No fever, fatigue, has weakness.  EYES: No blurred or double vision.  EARS, NOSE, AND THROAT: No tinnitus or ear pain.  RESPIRATORY: No cough, shortness of breath, wheezing or hemoptysis.  CARDIOVASCULAR: No chest pain, orthopnea, edema.  GASTROINTESTINAL: No nausea, vomiting, diarrhea or abdominal pain.  GENITOURINARY: No dysuria, hematuria.  ENDOCRINE: No polyuria, nocturia,  HEMATOLOGY: No anemia, easy bruising or bleeding SKIN: No rash or lesion. MUSCULOSKELETAL: No joint pain or arthritis.   NEUROLOGIC: No tingling, numbness, weakness.  PSYCHIATRY: No anxiety or depression.   DRUG ALLERGIES:   Allergies  Allergen Reactions  . Sulfa Antibiotics     unknown    VITALS:  Blood pressure 109/54, pulse 83, temperature 98.1 F (36.7 C), temperature source Oral, resp. rate 18, height 5\' 10"  (1.778 m), weight 86.183 kg (190 lb), SpO2 63 %.  PHYSICAL EXAMINATION:  GENERAL:  81 y.o.-year-old patient lying in the bed with no acute distress.  EYES: Pupils equal, round, reactive to light and accommodation.Looks pale. No scleral icterus. Extraocular muscles intact.  HEENT: Head atraumatic, normocephalic. Oropharynx and nasopharynx clear.  NECK:  Supple, no jugular venous distention. No thyroid enlargement, no tenderness.  LUNGS: Normal breath sounds bilaterally, no wheezing, rales,rhonchi or crepitation. No use of accessory muscles of respiration.  CARDIOVASCULAR: S1, S2 normal. positive murmurs, rubs, or gallops.  ABDOMEN: Soft, nontender, nondistended. Bowel sounds present. No organomegaly or mass.  EXTREMITIES: No pedal edema, cyanosis, or  clubbing.  NEUROLOGIC: Cranial nerves II through XII are intact. Muscle strength 5/5 in all extremities. Sensation intact. Gait not checked.  PSYCHIATRIC: The patient is alert and oriented x 3.  SKIN: No obvious rash, lesion, or ulcer.    LABORATORY PANEL:   CBC  Recent Labs Lab 04/07/15 0556  WBC 7.0  HGB 8.8*  HCT 27.2*  PLT 518*   ------------------------------------------------------------------------------------------------------------------  Chemistries   Recent Labs Lab 04/02/15 1700  04/05/15 0432 04/06/15 0411  NA 128*  < > 130* 129*  K 4.5  < > 3.9 3.9  CL 95*  < > 96* 97*  CO2 27  < > 26 27  GLUCOSE 106*  < > 98 118*  BUN 61*  < > 15 10  CREATININE 0.98  < > 0.72 0.70  CALCIUM 8.2*  < > 8.4* 8.2*  MG  --   --  2.2  --   AST 21  --   --   --   ALT 10*  --   --   --   ALKPHOS 96  --   --   --   BILITOT 0.5  --   --   --   < > = values in this interval not displayed. ------------------------------------------------------------------------------------------------------------------  Cardiac Enzymes No results for input(s): TROPONINI in the last 168 hours. ------------------------------------------------------------------------------------------------------------------  RADIOLOGY:  No results found.  EKG:   Orders placed or performed during the hospital encounter of 04/02/15  . EKG 12-Lead  . EKG 12-Lead    ASSESSMENT AND PLAN:   Ty HiltsSydnor Stickles is a 81 y.o. male with a known history of hypertension, restless leg syndrome, mechanical aortic valve replacement surgery, osteoarthritis requiring bilateral  hip replacement surgeries and a recent left total hip replacement surgery on 03/21/2015. [facility] and was brought in secondary to weakness and noted to be anemic  * Acute on chronic anemia-  from recent  THR with Sheria Langameron erosions per EGD, with coumadin use -baseline hemoglobin is around 13, hemoglobin 2 weeks ago at the  time of discharge after his THR surgery was around 8. Hemoccult is positive. Hemoglobin now is improved from 5 --> 7 -->9.9-->8.9--> 8.1--> 8.8 today, after 2 +1 units PRBC.Marland Kitchen. Appreciate GI and cardiology Dr. Windell HummingbirdGollan's consult. Monitor  INR today at 1.24 , received vit K as suggested by GI Prior to EGD, we'll continue the patient on Protonix by mouth twice a day and Carafate. Patient will be continued on full liquid diet today  as recommended by GI. Monitor CBC. Patient had iron sucrose infusion yesterday . Patient needs outpatient colonoscopy in 2-3 weeks approximately as per GI * HTN-blood pressure now has improved.  we will resume his valsartan and Norvasc. Continue to monitor. * Mechanical aortic valve replacement- goal INR 2-3 , but with his GI bleed and anemia, resume Coumadin at this time. Pharmacy consult is placed for Coumadin management. We'll continue the patient on heparin drip without bolus as recommended by cardiology while INR is subtherapeutic Discussed with Dr.:Gollan  * Restless leg syndrome-continue ropinirole.  Plan d/w pt, daughter ,GI and RN      All the records are reviewed and case discussed with Care Management/Social Workerr. Management plans discussed with the patient, family and they are in agreement.  CODE STATUS: full  TOTAL TIME TAKING CARE OF THIS PATIENT: 35 minutes.   POSSIBLE D/C IN 2-3 DAYS once INR is therapeutic, DEPENDING ON CLINICAL CONDITION.   Ramonita LabGouru, Lindsea Olivar M.D on 04/07/2015 at 4:27 PM  Between 7am to 6pm - Pager - [phone] After 6pm go to [email or web address] - password [email or web address] [facility]  [facility]  Office  [phone]  CC: Primary care physician; [name], [name], MD

## 2015-04-07 NOTE — Progress Notes (Signed)
Paged Dr.Gouru, informed of patient unable to have a bowel movement, given dulcolax x1 for constipation at 1158am, order received to disimpact patient. MD to add laxative.

## 2015-04-07 NOTE — Outcomes Assessment (Signed)
VSS, patient is A+O and pain is controlled with current medications.  INR is 1.16 and hep changed per protocol.  Appears to have slept well.  Voids without difficulty. Smear bm. Up to chair before bed.

## 2015-04-07 NOTE — Progress Notes (Signed)
ANTICOAGULATION CONSULT NOTE - Follow Up Consult  Pharmacy Consult for Heparin Indication: Mechanical aortic valve replacement  Allergies  Allergen Reactions  . Sulfa Antibiotics     unknown    Patient Measurements: Height: 5\' 10"  (177.8 cm) Weight: 190 lb (86.183 kg) IBW/kg (Calculated) : 73 Heparin Dosing Weight: 86  Vital Signs: Temp: 97.4 F (36.3 C) (05/13 0342) Temp Source: Oral (05/13 0342) BP: 126/56 mmHg (05/13 0342) Pulse Rate: 74 (05/13 0342)  Labs:  Recent Labs  04/05/15 0432 04/06/15 0411 04/06/15 1727 04/07/15 0556  HGB 8.9* 8.1*  --  8.8*  HCT 26.4* 24.3*  --  27.2*  PLT 530* 481*  --  518*  LABPROT 17.1* 15.1* 15.0  --   INR 1.37 1.17 1.16  --   HEPARINUNFRC  --   --  0.16* 1.00*  CREATININE 0.72 0.70  --   --     Estimated Creatinine Clearance: 74.8 mL/min (by C-G formula based on Cr of 0.7).   Medications:  Scheduled:  . amLODipine  10 mg Oral Daily  . citalopram  20 mg Oral Daily  . feeding supplement (ENSURE ENLIVE)  237 mL Oral BID BM  . finasteride  5 mg Oral Daily  . irbesartan  75 mg Oral Daily  . multivitamin  1 tablet Oral Daily  . pantoprazole  40 mg Oral BID  . rOPINIRole  0.5 mg Oral TID  . sodium chloride  3 mL Intravenous Q12H  . sucralfate  1 g Oral TID WC & HS  . tamsulosin  0.4 mg Oral Daily  . traZODone  50 mg Oral QHS  . warfarin  7.5 mg Oral Q24H   Infusions:  . heparin 1,400 Units/hr (04/06/15 2048)    Assessment: HL is above goal.  Goal of Therapy:  Heparin level 0.3-0.7 units/ml Monitor platelets by anticoagulation protocol: Yes   Plan:  Will hold heparin for 1 hour and decrease rate to 1150 units/hr. Next HL 8 h after rate change.  Continue to monitor H&H and platelets  Luisa HartChristy, Harlea Goetzinger D 04/07/2015,7:12 AM

## 2015-04-07 NOTE — Progress Notes (Signed)
Patient complain of constipation, given dulcolax x 1- no results, per md order disimpaction done x1- medium black stool disimpacted,also given lactulose x1 po with positive results.  Pain med's given as needed (see MAR)- with relief, voids in urinal, Heparin drip infusing at 11.255ml/hr, Heparin level at 0.42 at 1647 pm. Labs pending till am.

## 2015-04-07 NOTE — Progress Notes (Signed)
ANTICOAGULATION CONSULT NOTE - Follow Up Consult  Pharmacy Consult for Heparin and Warfarin Dosing Indication: Mechanical aortic valve replacement  Allergies  Allergen Reactions  . Sulfa Antibiotics     unknown    Patient Measurements: Height: 5\' 10"  (177.8 cm) Weight: 190 lb (86.183 kg) IBW/kg (Calculated) : 73   Vital Signs: Temp: 97.6 F (36.4 C) (05/13 0728) Temp Source: Oral (05/13 0728) BP: 149/62 mmHg (05/13 0728) Pulse Rate: 83 (05/13 0728)  Labs:  Recent Labs  04/05/15 0432 04/06/15 0411 04/06/15 1727 04/07/15 0556  HGB 8.9* 8.1*  --  8.8*  HCT 26.4* 24.3*  --  27.2*  PLT 530* 481*  --  518*  LABPROT 17.1* 15.1* 15.0 15.8*  INR 1.37 1.17 1.16 1.24  HEPARINUNFRC  --   --  0.16* 1.00*  CREATININE 0.72 0.70  --   --     Estimated Creatinine Clearance: 74.8 mL/min (by C-G formula based on Cr of 0.7).   Medications:  Scheduled:  . amLODipine  10 mg Oral Daily  . citalopram  20 mg Oral Daily  . feeding supplement (ENSURE ENLIVE)  237 mL Oral BID BM  . finasteride  5 mg Oral Daily  . irbesartan  75 mg Oral Daily  . multivitamin  1 tablet Oral Daily  . pantoprazole  40 mg Oral BID  . rOPINIRole  0.5 mg Oral TID  . sodium chloride  3 mL Intravenous Q12H  . sucralfate  1 g Oral TID WC & HS  . tamsulosin  0.4 mg Oral Daily  . traZODone  50 mg Oral QHS  . warfarin  7.5 mg Oral Q24H     Assessment: 81 yo male with gi bleed being resumed on warfarin and bridged with heparin drip. Patient being managed conservatively with no bolus. Patient ordered pantoprazole and sucralfate.   Goal of Therapy:  INR 2-3 Heparin level 0.3-0.7 units/ml Monitor platelets by anticoagulation protocol: Yes  Plan:  Continue conservative management. Heparin level was elevated at 1. Drip rate decreased to 1150unit/hr and recheck Anti-Xa on 5/13 at 1630hrs. Continue to monitor H&H and platelets   INR slowly increasing. Will continue warfarin 7.5mg  daily for goal INR of  2-3. Will obtain INR with am labs and adjust accordingly.   Pharmacy will continue to monitor and adjust per consult Darrell Hauk K 04/07/2015,9:52 AM

## 2015-04-07 NOTE — Progress Notes (Signed)
[facility]. [facility] [name] [facility] summary is needed today for weekend D/C. CSW made MD aware of above. Per MD patient may not be ready over weekend, his INR is not therapeutic yet. CSW will continue to follow and assist as needed.   Jetta LoutBailey Morgan, LCSWA [phone]

## 2015-04-08 ENCOUNTER — Encounter: Payer: Self-pay | Admitting: Gastroenterology

## 2015-04-08 LAB — CBC
HCT: 23.4 % — ABNORMAL LOW (ref 40.0–52.0)
HEMATOCRIT: 23 % — AB (ref 40.0–52.0)
Hemoglobin: 7.5 g/dL — ABNORMAL LOW (ref 13.0–18.0)
Hemoglobin: 7.5 g/dL — ABNORMAL LOW (ref 13.0–18.0)
MCH: 28.7 pg (ref 26.0–34.0)
MCH: 29.2 pg (ref 26.0–34.0)
MCHC: 32.1 g/dL (ref 32.0–36.0)
MCHC: 32.8 g/dL (ref 32.0–36.0)
MCV: 89.4 fL (ref 80.0–100.0)
MCV: 89.1 fL (ref 80.0–100.0)
Platelets: 514 10*3/uL — ABNORMAL HIGH (ref 150–440)
Platelets: 360 10*3/uL (ref 150–440)
RBC: 2.62 MIL/uL — AB (ref 4.40–5.90)
RBC: 2.58 MIL/uL — ABNORMAL LOW (ref 4.40–5.90)
RDW: 18.3 % — AB (ref 11.5–14.5)
RDW: 17.1 % — ABNORMAL HIGH (ref 11.5–14.5)
WBC: 16.9 10*3/uL — ABNORMAL HIGH (ref 3.8–10.6)
WBC: 13.5 10*3/uL — ABNORMAL HIGH (ref 3.8–10.6)

## 2015-04-08 LAB — PREPARE RBC (CROSSMATCH)

## 2015-04-08 LAB — HEPARIN LEVEL (UNFRACTIONATED): Heparin Unfractionated: <0.1 IU/mL — ABNORMAL LOW (ref 0.30–0.70)

## 2015-04-08 LAB — PROTIME-INR
INR: 1.52
Prothrombin Time: 18.5 seconds — ABNORMAL HIGH (ref 11.4–15.0)

## 2015-04-08 MED ORDER — ROPINIROLE HCL 0.25 MG PO TABS
0.5000 mg | ORAL_TABLET | Freq: Once | ORAL | Status: AC
  Administered 2015-04-08: 0.5 mg via ORAL
  Filled 2015-04-08: qty 2

## 2015-04-08 MED ORDER — SODIUM CHLORIDE 0.9 % IV SOLN
50.0000 ug/h | INTRAVENOUS | Status: DC
  Administered 2015-04-08 – 2015-04-12 (×10): 50 ug/h via INTRAVENOUS
  Filled 2015-04-08 (×27): qty 1

## 2015-04-08 MED ORDER — SODIUM CHLORIDE 0.9 % IV SOLN
Freq: Once | INTRAVENOUS | Status: AC
  Administered 2015-04-08: 12:00:00 via INTRAVENOUS

## 2015-04-08 MED ORDER — ROPINIROLE HCL 1 MG PO TABS
1.0000 mg | ORAL_TABLET | Freq: Three times a day (TID) | ORAL | Status: DC
  Administered 2015-04-08 – 2015-04-13 (×16): 1 mg via ORAL
  Filled 2015-04-08 (×16): qty 1

## 2015-04-08 MED ORDER — ACETAMINOPHEN 325 MG PO TABS
650.0000 mg | ORAL_TABLET | Freq: Once | ORAL | Status: AC
  Administered 2015-04-08: 650 mg via ORAL
  Filled 2015-04-08: qty 2

## 2015-04-08 MED ORDER — DIPHENHYDRAMINE HCL 50 MG/ML IJ SOLN
25.0000 mg | Freq: Once | INTRAMUSCULAR | Status: AC
  Administered 2015-04-08: 25 mg via INTRAVENOUS
  Filled 2015-04-08: qty 1

## 2015-04-08 MED ORDER — OCTREOTIDE LOAD VIA INFUSION
50.0000 ug | Freq: Once | INTRAVENOUS | Status: AC
  Administered 2015-04-08: 50 ug via INTRAVENOUS
  Filled 2015-04-08: qty 25

## 2015-04-08 MED ORDER — SODIUM CHLORIDE 0.9 % IV SOLN: Freq: Once | INTRAVENOUS | Status: DC

## 2015-04-08 MED ORDER — SODIUM CHLORIDE 0.9 % IV SOLN
Freq: Once | INTRAVENOUS | Status: AC
  Administered 2015-04-08: 04:00:00 via INTRAVENOUS

## 2015-04-08 NOTE — Progress Notes (Addendum)
ANTICOAGULATION CONSULT NOTE - Follow Up Consult  Pharmacy Consult for Heparin and Warfarin Dosing Indication: Mechanical aortic valve replacement  Allergies  Allergen Reactions  . Sulfa Antibiotics     unknown    Patient Measurements: Height: 5\' 10"  (177.8 cm) Weight: 190 lb (86.183 kg) IBW/kg (Calculated) : 73   Vital Signs: Temp: 98.3 F (36.8 C) (05/14 0900) Temp Source: Oral (05/14 0900) BP: 98/62 mmHg (05/14 0530) Pulse Rate: 94 (05/14 0530)  Labs:  Recent Labs  04/06/15 0411  04/06/15 1727 04/07/15 0556 04/07/15 1647 04/07/15 2240 04/08/15 0654 04/08/15 0801  HGB 8.1*  --   --  8.8*  --  6.9*  6.9* 7.5*  --   HCT 24.3*  --   --  27.2*  --  21.3* 23.4*  --   PLT 481*  --   --  518*  --  634* 514*  --   LABPROT 15.1*  --  15.0 15.8*  --   --   --  18.5*  INR 1.17  --  1.16 1.24  --   --   --  1.52  HEPARINUNFRC  --   < > 0.16* 1.00* 0.42  --   --  <0.10*  CREATININE 0.70  --   --   --   --   --   --   --   < > = values in this interval not displayed.  Estimated Creatinine Clearance: 74.8 mL/min (by C-G formula based on Cr of 0.7).   Medications:  Scheduled:  . sodium chloride   Intravenous Once  . acetaminophen  650 mg Oral Once  . amLODipine  10 mg Oral Daily  . citalopram  20 mg Oral Daily  . diphenhydrAMINE  25 mg Intravenous Once  . feeding supplement (ENSURE ENLIVE)  237 mL Oral BID BM  . finasteride  5 mg Oral Daily  . irbesartan  75 mg Oral Daily  . lactulose  20 g Oral BID  . multivitamin  1 tablet Oral Daily  . rOPINIRole  0.5 mg Oral TID  . sodium chloride  3 mL Intravenous Q12H  . sodium phosphate  1 enema Rectal Once  . sucralfate  1 g Oral TID WC & HS  . tamsulosin  0.4 mg Oral Daily  . traZODone  50 mg Oral QHS  . warfarin  7.5 mg Oral Q24H     Assessment: This 81 yo male with gi bleed being resumed on warfarin and bridged with heparin drip.   Patient with melena and hematemesis overnight, MD orders to hold heparin and  coumadin dose 5/14. Per RN patient continuing to bleed. Started protonix and octreotide drips.  Heparin Level < 0.10, INR: 1.52  Goal of Therapy:  INR 2-3 Heparin level 0.3-0.7 units/ml Monitor platelets by anticoagulation protocol: Yes  Plan:  Heparin and coumadin held at this time due to bleeding. Will follow up with MD in regards to plan for anticoagulation.  Garlon HatchetJody Maliya Marich, PharmD   04/08/2015,9:59 AM

## 2015-04-08 NOTE — Outcomes Assessment (Signed)
Patient is A+O.  Had 1 large BM  Dark/black/tary that was guiac positive. Patient complaints of abdominal pain/discomfort unrelieved with medications.  Has had four moderate amounts dark bloody emesis that was unmeasured throughout the night.  Both MD Blake DivineWillis and Gastro MD Oh advised on status.  Patient was placed NPO, protonix IV infusing, octreotide infusing, heparin drip was stopped per MD willis.  500ml saline bolus brought blood pressure from systolic 80's to mid 90's.  1 unit of blood transfusing.  Patient sleeps at intervals during care.  Skin appears pale.

## 2015-04-08 NOTE — Progress Notes (Signed)
[facility] [place] [facility]   PATIENT NAME: [name]    MR#:  [id number]  DATE OF BIRTH:  1931-09-01  SUBJECTIVE:  CHIEF COMPLAINT:   Chief Complaint  Patient presents with  . Weakness   Hematemesis x 3 and hematochezia last night. Hgb low, transfused, improved, hypotensive initiatlly. . INR 1.5. Started on protonix drip and octreotide drip overnight. Also, on carafate. Made NPO last night. Heparin drip stopped yesterday. To receive 1 more unit of PRBC today. Feels poorly, but wants to drink, eat  Review of Systems  Constitutional: Negative for fever, chills and weight loss.  HENT: Positive for hearing loss.   Eyes: Negative for blurred vision.  Respiratory: Negative for cough and hemoptysis.   Cardiovascular: Negative for chest pain and palpitations.  Gastrointestinal: Positive for nausea, vomiting, blood in stool and melena.  Genitourinary: Negative for dysuria.  Musculoskeletal: Negative for myalgias.  Skin: Negative for rash.  Neurological: Positive for dizziness. Negative for tingling, tremors and headaches.  Psychiatric/Behavioral: Negative for depression.  severe RLS symptoms today  VITAL SIGNS: Blood pressure 126/60, pulse 86, temperature 98.3 F (36.8 C), temperature source Oral, resp. rate 18, height 5\' 10"  (1.778 m), weight 86.183 kg (190 lb), SpO2 92 %.  PHYSICAL EXAMINATION:   GENERAL:  81 y.o.-year-old patient lying in the bed with no acute distress. Pale like a paper, some dried blood at lips EYES: Pupils equal, round, reactive to light and accommodation. No scleral icterus. Extraocular muscles intact.  HEENT: Head atraumatic, normocephalic. Oropharynx and nasopharynx clear.  NECK:  Supple, no jugular venous distention. No thyroid enlargement, no tenderness.  LUNGS: Normal breath sounds bilaterally, no wheezing, rales,rhonchi or crepitation. No use of accessory muscles of respiration.  CARDIOVASCULAR: S1, S2 normal. No  murmurs, rubs, or gallops.  ABDOMEN: Soft, nontender, nondistended. Bowel sounds present. No organomegaly or mass.  EXTREMITIES: No pedal edema, cyanosis, or clubbing.  NEUROLOGIC: Cranial nerves II through XII are intact. Muscle strength 5/5 in all extremities. Sensation intact. Gait not checked.  PSYCHIATRIC: The patient is alert and oriented x 3.  SKIN: No obvious rash, lesion, or ulcer.   ORDERS/RESULTS REVIEWED:   CBC  Recent Labs Lab 04/02/15 1700  04/05/15 0432 04/06/15 0411 04/07/15 0556 04/07/15 2240 04/08/15 0654  WBC 12.7*  < > 8.1 7.2 7.0 14.0* 16.9*  HGB 5.1*  < > 8.9* 8.1* 8.8* 6.9*  6.9* 7.5*  HCT 16.1*  < > 26.4* 24.3* 27.2* 21.3* 23.4*  PLT 638*  < > 530* 481* 518* 634* 514*  MCV 85.4  < > 86.3 87.4 88.1 88.3 89.4  MCH 27.0  < > 28.9 29.1 28.4 28.8 28.7  MCHC 31.7*  < > 33.5 33.3 32.3 32.6 32.1  RDW 17.2*  < > 17.5* 18.2* 19.6* 20.1* 18.3*  LYMPHSABS 1.7  --   --   --   --   --   --   MONOABS 0.8  --   --   --   --   --   --   EOSABS 0.2  --   --   --   --   --   --   BASOSABS 0.1  --   --   --   --   --   --   < > = values in this interval not displayed. ------------------------------------------------------------------------------------------------------------------  Chemistries   Recent Labs Lab 04/02/15 1700 04/03/15 0637 04/05/15 0432 04/06/15 0411  NA 128* 130* 130* 129*  K 4.5  4.4 3.9 3.9  CL 95* 100* 96* 97*  CO2 27 25 26 27   GLUCOSE 106* 120* 98 118*  BUN 61* 46* 15 10  CREATININE 0.98 0.75 0.72 0.70  CALCIUM 8.2* 7.8* 8.4* 8.2*  MG  --   --  2.2  --   AST 21  --   --   --   ALT 10*  --   --   --   ALKPHOS 96  --   --   --   BILITOT 0.5  --   --   --    ------------------------------------------------------------------------------------------------------------------ estimated creatinine clearance is 74.8 mL/min (by C-G formula based on Cr of  0.7). ------------------------------------------------------------------------------------------------------------------ No results for input(s): TSH, T4TOTAL, T3FREE, THYROIDAB in the last 72 hours.  Invalid input(s): FREET3  Cardiac Enzymes No results for input(s): CKMB, TROPONINI, MYOGLOBIN in the last 168 hours.  Invalid input(s): CK ------------------------------------------------------------------------------------------------------------------ Invalid input(s): POCBNP ---------------------------------------------------------------------------------------------------------------  RADIOLOGY: No results found.  EKG:  Orders placed or performed during the hospital encounter of 04/02/15  . EKG 12-Lead  . EKG 12-Lead    ASSESSMENT AND PLAN: [name] is a 81 y.o. male with a known history of hypertension, restless leg syndrome, mechanical aortic valve replacement surgery, osteoarthritis requiring bilateral hip replacement surgeries and a recent left total hip replacement surgery on 03/21/2015. [facility] and was brought in secondary to weakness and noted to be anemic  * Acute on chronic GIB  anemia- from recent THR with Cameron erosions per EGD, with coumadin use -baseline hemoglobin is around 13, hemoglobin 2 weeks ago at the time of discharge after his THR surgery was around 8. Hemoccult is positive. Hemoglobin now has improved after PRBC units .Marland Kitchen. Appreciate GI and cardiology Dr. Windell HummingbirdGollan's consult. Monitoring INR,  today at 1.52 , received vit K as suggested by GI Prior to EGD, we'll continue the patient on Protonix IV,. octreotide and Carafate. Patient will be NPO as recommended by GI. Monitor CBC. Patient had iron sucrose infusion yesterday . Patient needs outpatient colonoscopy in 2-3 weeks approximately as per GI * Hypotension with h/o essential HTN-blood pressure now is low due to bleeding, hold  his valsartan and Norvasc. Continue to monitor.  ON IVF * Mechanical aortic valve replacement- goal INR 2-3 , but with his GI bleed and anemia, holding Coumadin at this time. Pharmacy consult is placed for Coumadin management. * leukocytosis, likely stress, no obvious infection, following * coagulopathy due to coumadin, hold coumadin, following clinically  Management plans discussed with the patient, family and they are in agreement.   DRUG ALLERGIES:  Allergies  Allergen Reactions  . Sulfa Antibiotics     unknown    CODE STATUS:     Code Status Orders        Start     Ordered   04/02/15 2123  Full code   Continuous     04/02/15 2122    Advance Directive Documentation        Most Recent Value   Type of Advance Directive  Healthcare Power of Attorney, Living will   Pre-existing out of facility DNR order (yellow form or pink MOST form)     "MOST" Form in Place?        TOTAL TIME TAKING CARE OF THIS PATIENT: 45  minutes.  detailed discussion with family, all questions answered, voiced understanding  Nguyen Butler M.D on 04/08/2015 at 11:20 AM  Between 7am to 6pm - Pager - [phone]  After 6pm go  to [email or web address] - password [email or web address] [facility]  [place] [facility]  Office  [phone]  CC: Primary care physician; [name], MD

## 2015-04-08 NOTE — Progress Notes (Signed)
Update:  Pt began having hematemesis and melena, on heparin gtt for mechanical heart valve.  Hemoglobin dropped nearly 2 points.  PRBC transfusion ordered along with protonix gtt, octreotide.  High concern to stop heparin given risk clotting and emobolus, however, given significant active bleeding will hold heparin for now.  BP low but holding stable SBP high 90s after some fluids and prior to PRBC infusion.    Kristeen MissWILLIS, Gary Gabrielsen FIELDING [facility] 04/08/2015, 3:26 AM

## 2015-04-08 NOTE — Plan of Care (Signed)
Problem: Phase II Progression Outcomes Goal: No active bleeding Outcome: Not Progressing Bloody emesis/occult stool positive

## 2015-04-08 NOTE — Progress Notes (Signed)
Physical Therapy Treatment Patient Details Name: Vito BergerSydnor Arnold Baughman MRN: [id number] DOB: 02-20-1931 Today's Date: 04/08/2015    History of Present Illness pt had L hip replacement 4/26 now back with anemia    PT Comments    Pt with low HGB and will be getting another transfusion this afternoon.  He has been vomiting blood this AM, between these two OOB PT was deferred.  Pt shows good effort with bed exercises, but is dealing with L thigh spasms t/o exercises, of note has very limited L knee flexion.  Follow Up Recommendations  SNF     Equipment Recommendations       Recommendations for Other Services       Precautions / Restrictions Restrictions LLE Weight Bearing: Weight bearing as tolerated    Mobility  Bed Mobility Overal bed mobility:  (deferred bed mobility 2* pt vomiting blood earlier & low HGB)                Transfers                    Ambulation/Gait                 Stairs            Wheelchair Mobility    Modified Rankin (Stroke Patients Only)       Balance                                    Cognition Arousal/Alertness: Awake/alert Behavior During Therapy: WFL for tasks assessed/performed Overall Cognitive Status: Within Functional Limits for tasks assessed                      Exercises Total Joint Exercises Quad Sets: Strengthening;10 reps Short Arc Quad: AROM;Strengthening;10 reps Heel Slides: AAROM;Strengthening;10 reps (gentle knee flexion overpressure on L ) Hip ABduction/ADduction: 10 reps;Strengthening;AROM;Supine    General Comments        Pertinent Vitals/Pain Pain Assessment:  (pt having thigh spasming pain t/o session)    Home Living                      Prior Function            PT Goals (current goals can now be found in the care plan section) Progress towards PT goals: Progressing toward goals    Frequency  7X/week    PT Plan Current plan remains  appropriate    Co-evaluation             End of Session           Time: [id number] PT Time Calculation (min) (ACUTE ONLY): 13 min  Charges:  $Gait Training: 8-22 mins                    G Codes:      Malachi ProGalen R Lauretta Sallas 04/08/2015, 12:27 PM

## 2015-04-08 NOTE — Consult Note (Signed)
GI Inpatient Follow-up Note  Patient Identification: [name] is a 81 y.o. male  Subjective: Covering for Dr. Mechele CollinElliott. Asked to see pt this AM due to hematemesis x 3 and hematochezia since last night. Hgb ok. INR 1.5. Started on protonix drip and octreotide drip overnight. Also, on carafate. Made NPO last night. Heparin drip stopped yesterday. To receive 1 unit of PRBC today.  Scheduled Inpatient Medications:  . sodium chloride   Intravenous Once  . acetaminophen  650 mg Oral Once  . amLODipine  10 mg Oral Daily  . citalopram  20 mg Oral Daily  . diphenhydrAMINE  25 mg Intravenous Once  . feeding supplement (ENSURE ENLIVE)  237 mL Oral BID BM  . finasteride  5 mg Oral Daily  . irbesartan  75 mg Oral Daily  . lactulose  20 g Oral BID  . multivitamin  1 tablet Oral Daily  . rOPINIRole  0.5 mg Oral TID  . sodium chloride  3 mL Intravenous Q12H  . sodium phosphate  1 enema Rectal Once  . sucralfate  1 g Oral TID WC & HS  . tamsulosin  0.4 mg Oral Daily  . traZODone  50 mg Oral QHS    Continuous Inpatient Infusions:   . octreotide  (SANDOSTATIN)    IV infusion 50 mcg/hr (04/08/15 0328)  . pantoprozole (PROTONIX) infusion 8 mg/hr (04/07/15 2340)    PRN Inpatient Medications:  acetaminophen **OR** acetaminophen, albuterol, ALPRAZolam, aluminum-magnesium hydroxide, bisacodyl, cyclobenzaprine, docusate sodium, HYDROcodone-acetaminophen, lactulose, morphine injection, ondansetron (ZOFRAN) IV, senna  Review of Systems: Constitutional: Weight is stable.  Eyes: No changes in vision. ENT: No oral lesions, sore throat.  GI: see HPI.  Heme/Lymph: No easy bruising.  CV: No chest pain.  GU: No hematuria.  Integumentary: No rashes.  Neuro: No headaches.  Psych: No depression/anxiety.  Endocrine: No heat/cold intolerance.  Allergic/Immunologic: No urticaria.  Resp: No cough, SOB.  Musculoskeletal: No joint swelling.    Physical Examination: BP 98/62 mmHg  Pulse 94   Temp(Src) 98.3 F (36.8 C) (Oral)  Resp 18  Ht 5\' 10"  (1.778 m)  Wt 86.183 kg (190 lb)  BMI 27.26 kg/m2  SpO2 99% Gen: NAD, alert and oriented x 4 HEENT: PEERLA, EOMI, Neck: supple, no JVD or thyromegaly Chest: CTA bilaterally, no wheezes, crackles, or other adventitious sounds CV: RRR, no m/g/c/r Abd: soft, NT, ND, +BS in all four quadrants; no HSM, guarding, ridigity, or rebound tenderness Ext: no edema, well perfused with 2+ pulses, Skin: no rash or lesions noted Lymph: no LAD  Data: Lab Results  Component Value Date   WBC 16.9* 04/08/2015   HGB 7.5* 04/08/2015   HCT 23.4* 04/08/2015   MCV 89.4 04/08/2015   PLT 514* 04/08/2015    Recent Labs Lab 04/07/15 0556 04/07/15 2240 04/08/15 0654  HGB 8.8* 6.9*  6.9* 7.5*   Lab Results  Component Value Date   NA 129* 04/06/2015   K 3.9 04/06/2015   CL 97* 04/06/2015   CO2 27 04/06/2015   BUN 10 04/06/2015   CREATININE 0.70 04/06/2015   Lab Results  Component Value Date   ALT 10* 04/02/2015   AST 21 04/02/2015   ALKPHOS 96 04/02/2015   BILITOT 0.5 04/02/2015    Recent Labs Lab 04/02/15 1700  04/08/15 0801  APTT 40*  --   --   INR 3.09  < > 1.52  < > = values in this interval not displayed. Assessment/Plan: [name] is a 81 y.o. male  with likely bleeding from erosions in hiatal hernia sac.   Recommendations: Agree with protonix IV and carafate. Keep NPO. Moniter hgb closely. Continue to hold heparin and coumadin until we know bleeding has stopped. Discussed with pt's daughter, [name]. Will follow.  Please call with questions or concerns.  Trinadee Verhagen, Ezzard StandingPAUL Y, MD

## 2015-04-09 ENCOUNTER — Inpatient Hospital Stay: Payer: Medicare Other

## 2015-04-09 LAB — CBC
HCT: 22.7 % — ABNORMAL LOW (ref 40.0–52.0)
Hemoglobin: 7.7 g/dL — ABNORMAL LOW (ref 13.0–18.0)
MCH: 30.1 pg (ref 26.0–34.0)
MCHC: 33.8 g/dL (ref 32.0–36.0)
MCV: 89 fL (ref 80.0–100.0)
Platelets: 387 K/uL (ref 150–440)
RBC: 2.55 MIL/uL — ABNORMAL LOW (ref 4.40–5.90)
RDW: 17.8 % — ABNORMAL HIGH (ref 11.5–14.5)
WBC: 16.5 K/uL — ABNORMAL HIGH (ref 3.8–10.6)

## 2015-04-09 LAB — HEMOGLOBIN: Hemoglobin: 8 g/dL — ABNORMAL LOW (ref 13.0–18.0)

## 2015-04-09 LAB — PROTIME-INR
INR: 1.88
Prothrombin Time: 21.8 seconds — ABNORMAL HIGH (ref 11.4–15.0)

## 2015-04-09 MED ORDER — LEVOFLOXACIN IN D5W 500 MG/100ML IV SOLN
500.0000 mg | INTRAVENOUS | Status: DC
  Administered 2015-04-09 – 2015-04-10 (×2): 500 mg via INTRAVENOUS
  Filled 2015-04-09 (×3): qty 100

## 2015-04-09 MED ORDER — POTASSIUM CHLORIDE IN NACL 20-0.9 MEQ/L-% IV SOLN
INTRAVENOUS | Status: DC
  Administered 2015-04-09 – 2015-04-10 (×2): via INTRAVENOUS
  Filled 2015-04-09 (×5): qty 1000

## 2015-04-09 NOTE — Consult Note (Signed)
GI Inpatient Follow-up Note  Patient Identification: [name] is a 81 [name].o. male with UGI bleeding.  Subjective:Quite sleepy from various meds he is on. Difficult to arouse. According to nurse, no more hematemesis. Some tinge of blood on stool this AM. Hgb higher after blood transfusion. INR upto 1.8 off coumadin/heparin.  Scheduled Inpatient Medications:  . sodium chloride   Intravenous Once  . amLODipine  10 mg Oral Daily  . citalopram  20 mg Oral Daily  . feeding supplement (ENSURE ENLIVE)  237 mL Oral BID BM  . finasteride  5 mg Oral Daily  . irbesartan  75 mg Oral Daily  . lactulose  20 g Oral BID  . multivitamin  1 tablet Oral Daily  . rOPINIRole  1 mg Oral TID  . sodium chloride  3 mL Intravenous Q12H  . sodium phosphate  1 enema Rectal Once  . sucralfate  1 g Oral TID WC & HS  . tamsulosin  0.4 mg Oral Daily  . traZODone  50 mg Oral QHS    Continuous Inpatient Infusions:   . 0.9 % NaCl with KCl 20 mEq / L    . octreotide  (SANDOSTATIN)    IV infusion 50 mcg/hr (04/09/15 1020)  . pantoprozole (PROTONIX) infusion 8 mg/hr (04/09/15 1015)    PRN Inpatient Medications:  acetaminophen **OR** acetaminophen, albuterol, ALPRAZolam, aluminum-magnesium hydroxide, bisacodyl, cyclobenzaprine, docusate sodium, HYDROcodone-acetaminophen, lactulose, morphine injection, ondansetron (ZOFRAN) IV, senna  Review of Systems: Constitutional: Weight is stable.  Eyes: No changes in vision. ENT: No oral lesions, sore throat.  GI: see HPI.  Heme/Lymph: No easy bruising.  CV: No chest pain.  GU: No hematuria.  Integumentary: No rashes.  Neuro: No headaches.  Psych: No depression/anxiety.  Endocrine: No heat/cold intolerance.  Allergic/Immunologic: No urticaria.  Resp: No cough, SOB.  Musculoskeletal: No joint swelling.    Physical Examination: BP 147/61 mmHg  Pulse 100  Temp(Src) 100.9 F (38.3 C) (Oral)  Resp 18  Ht 5\' 10"  (1.778 m)  Wt 86.183 kg (190 lb)  BMI  27.26 kg/m2  SpO2 99% Gen: NAD, alert and oriented x 4 HEENT: PEERLA, EOMI, Neck: supple, no JVD or thyromegaly Chest: CTA bilaterally, no wheezes, crackles, or other adventitious sounds CV: RRR, no m/g/c/r Abd: soft, NT, ND, +BS in all four quadrants; no HSM, guarding, ridigity, or rebound tenderness Ext: no edema, well perfused with 2+ pulses, Skin: no rash or lesions noted Lymph: no LAD  Data: Lab Results  Component Value Date   WBC 16.5* 04/09/2015   HGB 7.7* 04/09/2015   HCT 22.7* 04/09/2015   MCV 89.0 04/09/2015   PLT 387 04/09/2015    Recent Labs Lab 04/08/15 0654 04/08/15 1659 04/09/15 0349  HGB 7.5* 7.5* 7.7*   Lab Results  Component Value Date   NA 129* 04/06/2015   K 3.9 04/06/2015   CL 97* 04/06/2015   CO2 27 04/06/2015   BUN 10 04/06/2015   CREATININE 0.70 04/06/2015   Lab Results  Component Value Date   ALT 10* 04/02/2015   AST 21 04/02/2015   ALKPHOS 96 04/02/2015   BILITOT 0.5 04/02/2015    Recent Labs Lab 04/02/15 1700  04/09/15 0349  APTT 40*  --   --   INR 3.09  < > 1.88  < > = values in this interval not displayed. Assessment/Plan: [name] is a 81 [name].o. male with UGI bleeding. No active bleeding now.   Recommendations: Continue protonix/carafate. Will resume clear liquid diet. Could  consider resuming coumadin by tomorrow if no further bleeding. Dr. Mechele CollinElliott to resume GI care tomorrow. thanks Please call with questions or concerns.  [name], [name] StandingPAUL Y, MD

## 2015-04-09 NOTE — Progress Notes (Signed)
[facility] [place] at [facility]   PATIENT NAME: [name]    MR#:  [id number]  DATE OF BIRTH:  12/07/1930  SUBJECTIVE: Feels much better today. No significant bleeding, although was noted to have little amount of blood in the stool earlier today. Patient was transfused packed blood cells yesterday and his hemoglobin level has improved. Patient feels somewhat stronger today. He has been off Coumadin for a few days, although his INR is still elevated at 1.8. Febrile today to temperature of 100.9. Blood pressure has improved to a level of 147 early in the morning. Being continued on Protonix as well as octreotide drip. Dr. Bluford Kaufmann recommended to start him back on clear liquid diet.  CHIEF COMPLAINT:   Chief Complaint  Patient presents with  . Weakness     ROSsevere RLS symptoms today  VITAL SIGNS: Blood pressure 147/61, pulse 100, temperature 98.2 F (36.8 C), temperature source Oral, resp. rate 18, height  (1.778 m), weight 86.183 kg (190 lb), SpO2 99 %.  PHYSICAL EXAMINATION:   GENERAL:  81 y.o.-year-old patient lying in the bed with no acute distress. Comfortable and he is complexion has improved with pink cheeks. Oral mucosa remains somewhat dry EYES: Pupils equal, round, reactive to light and accommodation. No scleral icterus. Extraocular muscles intact.  HEENT: Head atraumatic, normocephalic. Oropharynx and nasopharynx clear.  NECK:  Supple, no jugular venous distention. No thyroid enlargement, no tenderness.  LUNGS: Normal breath sounds bilaterally, no wheezing, rales,rhonchi or crepitation. No use of accessory muscles of respiration.  CARDIOVASCULAR: S1, S2 normal. No murmurs, rubs, or gallops.  ABDOMEN: Soft, nontender, nondistended. Bowel sounds present. No organomegaly or mass.  EXTREMITIES: No pedal edema, cyanosis, or clubbing.  NEUROLOGIC: Cranial nerves II through XII are intact. Muscle strength 5/5 in all extremities. Sensation intact.  Gait not checked.  PSYCHIATRIC: The patient is alert and oriented x 3.  SKIN: No obvious rash, lesion, or ulcer.   ORDERS/RESULTS REVIEWED:   CBC  Recent Labs Lab 04/02/15 1700  04/07/15 0556 04/07/15 2240 04/08/15 0654 04/08/15 1659 04/09/15 0349  WBC 12.7*  < > 7.0 14.0* 16.9* 13.5* 16.5*  HGB 5.1*  < > 8.8* 6.9*  6.9* 7.5* 7.5* 7.7*  HCT 16.1*  < > 27.2* 21.3* 23.4* 23.0* 22.7*  PLT 638*  < > 518* 634* 514* 360 387  MCV 85.4  < > 88.1 88.3 89.4 89.1 89.0  MCH 27.0  < > 28.4 28.8 28.7 29.2 30.1  MCHC 31.7*  < > 32.3 32.6 32.1 32.8 33.8  RDW 17.2*  < > 19.6* 20.1* 18.3* 17.1* 17.8*  LYMPHSABS 1.7  --   --   --   --   --   --   MONOABS 0.8  --   --   --   --   --   --   EOSABS 0.2  --   --   --   --   --   --   BASOSABS 0.1  --   --   --   --   --   --   < > = values in this interval not displayed. ------------------------------------------------------------------------------------------------------------------  Chemistries   Recent Labs Lab 04/02/15 1700 04/03/15 0637 04/05/15 0432 04/06/15 0411  NA 128* 130* 130* 129*  K 4.5 4.4 3.9 3.9  CL 95* 100* 96* 97*  CO2 GLUCOSE 106* 120* 98 118*  BUN 61* 46* 15 10  CREATININE 0.98 0.75 0.72  0.70  CALCIUM 8.2* 7.8* 8.4* 8.2*  MG  --   --  2.2  --   AST 21  --   --   --   ALT 10*  --   --   --   ALKPHOS 96  --   --   --   BILITOT 0.5  --   --   --    ------------------------------------------------------------------------------------------------------------------ estimated creatinine clearance is 74.8 mL/min (by C-G formula based on Cr of 0.7). ------------------------------------------------------------------------------------------------------------------ No results for input(s): TSH, T4TOTAL, T3FREE, THYROIDAB in the last 72 hours.  Invalid input(s): FREET3  Cardiac Enzymes No results for input(s): CKMB, TROPONINI, MYOGLOBIN in the last 168 hours.  Invalid input(s):  CK ------------------------------------------------------------------------------------------------------------------ Invalid input(s): POCBNP ---------------------------------------------------------------------------------------------------------------  RADIOLOGY: No results found.  EKG:  Orders placed or performed during the hospital encounter of 04/02/15  . EKG 12-Lead  . EKG 12-Lead    ASSESSMENT AND PLAN: [name] is a 81 y.o. male with a known history of hypertension, restless leg syndrome, mechanical aortic valve replacement surgery, osteoarthritis requiring bilateral hip replacement surgeries and a recent left total hip replacement surgery on 03/21/2015. He is currently at [name] and was brought in secondary to weakness and noted to be anemic  * Acute on chronic GIB  anemia- from recent THR with Cameron erosions per EGD, with coumadin use -baseline hemoglobin is around 13, hemoglobin 2 weeks ago at the time of discharge after his THR surgery was around 8. Hemoccult is positive. Hemoglobin now has improved after PRBC units .Marland Kitchen. Appreciate GI and cardiology Dr. Windell HummingbirdGollan's consult. Monitoring INR,  today at 1.8 , received vit K as suggested by GI Prior to EGD, we'll continue the patient on Protonix IV,. octreotide and Carafate. Patient will be on clear liquid diet as recommended by GI. Monitoring CBC. Patient had iron sucrose infusion in the recent past. Patient needs outpatient colonoscopy in 2-3 weeks approximately as per GI * Hypotension with h/o essential HTN-blood pressure now is low due to bleeding, may resume his valsartan and Norvasc. Continue to monitor. Discontinue IVF * Mechanical aortic valve replacement- goal INR 2-3 , but with his GI bleed and anemia, holding Coumadin at this time. Pharmacy consult is placed for Coumadin management. May restart Coumadin again if bleeding subsides * leukocytosis, thought to be stress related , but now patient has fevers,   get blood cultures taken and start patient on antibiotic therapy with levofloxacin, following * coagulopathy due to coumadin, hold coumadin, following clinically  Management plans discussed with the patient, family and they are in agreement.   DRUG ALLERGIES:  Allergies  Allergen Reactions  . Sulfa Antibiotics     unknown    CODE STATUS:     Code Status Orders        Start     Ordered   04/02/15 2123  Full code   Continuous     04/02/15 2122    Advance Directive Documentation        Most Recent Value   Type of Advance Directive  Healthcare Power of Attorney, Living will   Pre-existing out of facility DNR order (yellow form or pink MOST form)     "MOST" Form in Place?        TOTAL TIME TAKING CARE OF THIS PATIENT: 45  minutes.    Katharina CaperVAICKUTE,Miciah Covelli M.D on 04/09/2015 at 1:44 PM  Between 7am to 6pm - Pager - [phone]  After 6pm go to [email or web address] - password [email or web address] [facility]  Office  [phone]  CC: Primary care physician; [name], [name], MD

## 2015-04-09 NOTE — Progress Notes (Signed)
ANTICOAGULATION CONSULT NOTE - Follow Up Consult  Pharmacy Consult for Heparin and Warfarin Dosing Indication: Mechanical aortic valve replacement  Allergies  Allergen Reactions  . Sulfa Antibiotics     unknown    Patient Measurements: Height: 5\' 10"  (177.8 cm) Weight: 190 lb (86.183 kg) IBW/kg (Calculated) : 73   Vital Signs: Temp: 99 F (37.2 C) (05/15 0414) Temp Source: Oral (05/15 0414) BP: 132/53 mmHg (05/15 0414) Pulse Rate: 99 (05/15 0416)  Labs:  Recent Labs  04/07/15 0556 04/07/15 1647  04/08/15 0654 04/08/15 0801 04/08/15 1659 04/09/15 0349  HGB 8.8*  --   < > 7.5*  --  7.5* 7.7*  HCT 27.2*  --   < > 23.4*  --  23.0* 22.7*  PLT 518*  --   < > 514*  --  360 387  LABPROT 15.8*  --   --   --  18.5*  --  21.8*  INR 1.24  --   --   --  1.52  --  1.88  HEPARINUNFRC 1.00* 0.42  --   --  <0.10*  --   --   < > = values in this interval not displayed.  Estimated Creatinine Clearance: 74.8 mL/min (by C-G formula based on Cr of 0.7).   Medications:  Scheduled:  . sodium chloride   Intravenous Once  . amLODipine  10 mg Oral Daily  . citalopram  20 mg Oral Daily  . feeding supplement (ENSURE ENLIVE)  237 mL Oral BID BM  . finasteride  5 mg Oral Daily  . irbesartan  75 mg Oral Daily  . lactulose  20 g Oral BID  . multivitamin  1 tablet Oral Daily  . rOPINIRole  1 mg Oral TID  . sodium chloride  3 mL Intravenous Q12H  . sodium phosphate  1 enema Rectal Once  . sucralfate  1 g Oral TID WC & HS  . tamsulosin  0.4 mg Oral Daily  . traZODone  50 mg Oral QHS     Assessment: This 81 yo male with GI bleed being resumed on warfarin and bridged with heparin drip.   Patient with melena and hematemesis overnight, MD orders to hold heparin and coumadin dose 5/14.Started protonix and octreotide drips.  Last dose of warfarin given 5/13  Plan:  Per GI, will continue to hold heparin and coumadin until we know bleeding has stopped  Garlon HatchetJody Theo Reither, PharmD    04/09/2015,7:34 AM

## 2015-04-09 NOTE — Progress Notes (Signed)
Pt is alert and oriented. No complaints of pain.had one small dark brown stool this shift.  IV meds is in progress.UOP good. Resting comfortably in bed without any distress. Will continue to observe closely.

## 2015-04-09 NOTE — Progress Notes (Signed)
Physical Therapy Treatment Patient Details Name: [name] BergerSydnor Arnold Mazer MRN: [id number] DOB: May 08, 1931 Today's Date: 04/09/2015    History of Present Illness pt had L hip replacement 4/26 now back with anemia    PT Comments    Pt has a much better day today with PT. He still has some nausea, and is hHGB is low, but he is able to ambulate 200 ft and does well with exercises.  Follow Up Recommendations  SNF     Equipment Recommendations       Recommendations for Other Services       Precautions / Restrictions Precautions Precautions: Fall Restrictions Weight Bearing Restrictions: No LLE Weight Bearing: Weight bearing as tolerated    Mobility  Bed Mobility Overal bed mobility: Needs Assistance Bed Mobility: Supine to Sit     Supine to sit: Mod assist;Min assist        Transfers Overall transfer level: Needs assistance Equipment used: Rolling walker (2 wheeled) Transfers: Sit to/from Stand Sit to Stand: Min guard         General transfer comment: Limited L knee mobility effecting use during transition to standing.  Ambulation/Gait Ambulation/Gait assistance: Min guard Ambulation Distance (Feet): 200 Feet Assistive device: Rolling walker (2 wheeled)       General Gait Details: Pt does well with ambualtion today and his o2 remains in the high 90s on room air t/o the effort   Stairs            Wheelchair Mobility    Modified Rankin (Stroke Patients Only)       Balance                                    Cognition Arousal/Alertness: Lethargic Behavior During Therapy: [facility] CenterWFL for tasks assessed/performed                        Exercises Total Joint Exercises Quad Sets: Strengthening;10 reps Short Arc Quad: AROM;Strengthening;10 reps Heel Slides: AAROM;Strengthening;10 reps Hip ABduction/ADduction: 10 reps;Strengthening;AROM;Supine    General Comments        Pertinent Vitals/Pain Pain Assessment: 0-10 Pain Score:  5  (not having as much spasming today) Pain Location: L thigh, hip    Home Living                      Prior Function            PT Goals (current goals can now be found in the care plan section) Progress towards PT goals: Progressing toward goals    Frequency  7X/week    PT Plan Current plan remains appropriate    Co-evaluation             End of Session Equipment Utilized During Treatment: Gait belt Activity Tolerance: Patient tolerated treatment well Patient left: in chair;with call bell/phone within reach;with chair alarm set;with family/visitor present     Time: 4098-11911103-1131 PT Time Calculation (min) (ACUTE ONLY): 28 min  Charges:  $Gait Training: 8-22 mins $Therapeutic Exercise: 8-22 mins                    G Codes:     Loran SentersGalen Ayra Hodgdon, PT, DPT [phone]  Malachi ProGalen R Jet Armbrust 04/09/2015, 1:09 PM

## 2015-04-09 NOTE — Outcomes Assessment (Signed)
Pt last Hgb up to 8. INR1.8 no coumadin given today. Large BM x two with blood mixed in no frank blood like yesterday. Pt is nauseated dry heaves through out the day but no bloody vomit. Medicated with Zofran. BP med's given today pt up with PT and walked around nurses station. Unable to sustain peripheral iv access PICC line placed triple lumen, verified via xray. Sandostatin gtt and Protonix gtt going. Pt febrile today IV Levaquin given urine sent and cxr done  Shows pneumonia vs atelectasis. Pt was able to maintain sat's on RA oxygen currently not in use. Maintained communication with Dr. Seth BakeV through out the day as well as family.

## 2015-04-10 ENCOUNTER — Inpatient Hospital Stay (HOSPITAL_COMMUNITY)
Admit: 2015-04-10 | Discharge: 2015-04-10 | Disposition: A | Discharge status: Home / Self Care | Payer: Medicare Other | Attending: Infectious Diseases | Admitting: Infectious Diseases

## 2015-04-10 DIAGNOSIS — I340 Nonrheumatic mitral (valve) insufficiency: Secondary | ICD-10-CM

## 2015-04-10 LAB — C DIFFICILE QUICK SCREEN W PCR REFLEX
C DIFFICILE (CDIFF) TOXIN: NEGATIVE
C Diff antigen: POSITIVE

## 2015-04-10 LAB — TYPE AND SCREEN
ABO/RH(D): O POS
ANTIBODY SCREEN: NEGATIVE
UNIT DIVISION: 0
Unit division: 0
Unit division: 0

## 2015-04-10 LAB — CREATININE, SERUM
Creatinine, Ser: 0.62 mg/dL (ref 0.61–1.24)
GFR calc non Af Amer: >60 mL/min (ref >60)

## 2015-04-10 LAB — PROTIME-INR
INR: 2.2
Prothrombin Time: 24.6 seconds — ABNORMAL HIGH (ref 11.4–15.0)

## 2015-04-10 LAB — CLOSTRIDIUM DIFFICILE BY PCR: Toxigenic C. Difficile by PCR: POSITIVE — AB

## 2015-04-10 LAB — HEMOGLOBIN: HEMOGLOBIN: 6.6 g/dL — AB (ref 13.0–18.0)

## 2015-04-10 MED ORDER — SODIUM CHLORIDE 0.9 % IV SOLN
8.0000 mg/h | INTRAVENOUS | Status: DC
  Administered 2015-04-10 – 2015-04-12 (×5): 8 mg/h via INTRAVENOUS
  Filled 2015-04-10 (×6): qty 80

## 2015-04-10 MED ORDER — VITAMIN K1 1 MG/0.5ML IJ SOLN
1.0000 mg | Freq: Once | INTRAMUSCULAR | Status: AC
  Administered 2015-04-10: 1 mg via INTRAMUSCULAR
  Filled 2015-04-10: qty 0.5

## 2015-04-10 MED ORDER — VANCOMYCIN HCL 10 G IV SOLR
1250.0000 mg | Freq: Once | INTRAVENOUS | Status: AC
  Administered 2015-04-11: 1250 mg via INTRAVENOUS
  Filled 2015-04-10 (×2): qty 1250

## 2015-04-10 MED ORDER — METRONIDAZOLE 500 MG PO TABS
500.0000 mg | ORAL_TABLET | Freq: Three times a day (TID) | ORAL | Status: DC
  Administered 2015-04-10 – 2015-04-12 (×6): 500 mg via ORAL
  Filled 2015-04-10 (×6): qty 1

## 2015-04-10 MED ORDER — SODIUM CHLORIDE 0.9 % IV SOLN
1250.0000 mg | Freq: Two times a day (BID) | INTRAVENOUS | Status: DC
  Administered 2015-04-10 – 2015-04-11 (×3): 1250 mg via INTRAVENOUS
  Filled 2015-04-10 (×4): qty 1250

## 2015-04-10 MED ORDER — BOOST / RESOURCE BREEZE PO LIQD
1.0000 | Freq: Three times a day (TID) | ORAL | Status: DC
  Administered 2015-04-10: 1 via ORAL

## 2015-04-10 MED ORDER — SODIUM CHLORIDE 0.9 % IV SOLN
Freq: Once | INTRAVENOUS | Status: AC
  Administered 2015-04-10: 10:00:00 via INTRAVENOUS

## 2015-04-10 NOTE — Progress Notes (Signed)
Dr. Sherryll BurgerShah notified of hemoglobin 6.6, states " I will take care of it". Will continue to monitor

## 2015-04-10 NOTE — Progress Notes (Signed)
ANTICOAGULATION CONSULT NOTE - Follow Up Consult  Pharmacy Consult for Heparin and Warfarin Dosing Indication: Mechanical aortic valve replacement  Allergies  Allergen Reactions  . Sulfa Antibiotics     unknown    Patient Measurements: Height: 5\' 10"  (177.8 cm) Weight: 190 lb (86.183 kg) IBW/kg (Calculated) : 73   Vital Signs: Temp: 98.4 F (36.9 C) (05/16 0812) Temp Source: Oral (05/16 0812) BP: 117/51 mmHg (05/16 0812) Pulse Rate: 95 (05/16 0812)  Labs:  Recent Labs  04/07/15 1647  04/08/15 0654 04/08/15 0801 04/08/15 1659 04/09/15 0349 04/09/15 1703 04/10/15 0430  HGB  --   < > 7.5*  --  7.5* 7.7* 8.0* 6.6*  HCT  --   < > 23.4*  --  23.0* 22.7*  --   --   PLT  --   < > 514*  --  360 387  --   --   LABPROT  --   --   --  18.5*  --  21.8*  --  24.6*  INR  --   --   --  1.52  --  1.88  --  2.20  HEPARINUNFRC 0.42  --   --  <0.10*  --   --   --   --   < > = values in this interval not displayed.  Estimated Creatinine Clearance: 74.8 mL/min (by C-G formula based on Cr of 0.7).   Medications:  Scheduled:  . sodium chloride   Intravenous Once  . sodium chloride   Intravenous Once  . amLODipine  10 mg Oral Daily  . citalopram  20 mg Oral Daily  . feeding supplement (ENSURE ENLIVE)  237 mL Oral BID BM  . finasteride  5 mg Oral Daily  . irbesartan  75 mg Oral Daily  . lactulose  20 g Oral BID  . levofloxacin (LEVAQUIN) IV  500 mg Intravenous Q24H  . multivitamin  1 tablet Oral Daily  . rOPINIRole  1 mg Oral TID  . sodium chloride  3 mL Intravenous Q12H  . sodium phosphate  1 enema Rectal Once  . sucralfate  1 g Oral TID WC & HS  . tamsulosin  0.4 mg Oral Daily  . traZODone  50 mg Oral QHS     Assessment: This 81 yo male with GI bleed being resumed on warfarin and bridged with heparin drip.   Patient with melena and hematemesis overnight, MD orders to hold heparin and coumadin dose 5/14.Started protonix and octreotide drips.  Last dose of warfarin given  5/13. INR 2.2 today (1.88 yesterday).  Plan:  Per GI, will continue to hold heparin and coumadin until we know bleeding has stopped. Will order PT/INR for tomorrow AM.  Elvera MariaSonja Baila Rouse, PharmD  04/10/2015

## 2015-04-10 NOTE — Progress Notes (Signed)
Pt is alert and oriented, sleeping between care. VSS, febrile, improved with tylenol. Complains of nausea and anxiety both improved with medication on MAR. No active bleeding apparent tonight, dry heaving but no vomiting at this time. Will continue to monitor.

## 2015-04-10 NOTE — Progress Notes (Addendum)
[facility] - [place] at [facility]   PATIENT NAME: Ky Rumple    MR#:  [id number]  DATE OF BIRTH:  04-08-1931  SUBJECTIVE: Feels weak  today. Has continuous gastrointestinal bleeding with dark blood in the stool. Patient has been off Coumadin for a few days, although his INR is still elevated at 2.2. Febrile yesterday to temperature of 100.9. Cultures are growing Staphylococcus aureus 4 and patient is being continued on levofloxacin as well as vancomycin at present, he complains of some dry cough.  Blood pressure is low again at 91/44 today early in the morning. Being continued on Protonix as well as octreotide drip. Dr. Bluford Kaufmann recommended to start him back on clear liquid diet, has been on clear liquid diet since yesterday. Complains of dry heaves CHIEF COMPLAINT:   Chief Complaint  Patient presents with  . Weakness     Review of Systems  Unable to perform ROS: medical condition  severe RLS symptoms today  VITAL SIGNS: Blood pressure 108/47, pulse 92, temperature 98.3 F (36.8 C), temperature source Oral, resp. rate 18, height  (1.778 m), weight 86.183 kg (190 lb), SpO2 100 %.  PHYSICAL EXAMINATION:   GENERAL:  81 y.o.-year-old patient lying in the bed with no acute distress. Pale and sleepy, briefly opens his eyes and then drifts back to sleep. Oral mucosa remains somewhat dry. Some dark blood is caked around the lips.  EYES: Pupils equal, round, reactive to light and accommodation. No scleral icterus. Extraocular muscles intact.  HEENT: Head atraumatic, normocephalic. Oropharynx and nasopharynx clear.  NECK:  Supple, no jugular venous distention. No thyroid enlargement, no tenderness.  LUNGS: Normal breath sounds bilaterally, no wheezing, rales,rhonchi or crepitation. No use of accessory muscles of respiration.  CARDIOVASCULAR: S1, S2 normal. No murmurs, rubs, or gallops.  ABDOMEN: Soft, nontender, nondistended. Bowel sounds present. No organomegaly  or mass.  EXTREMITIES: No pedal edema, cyanosis, or clubbing.  NEUROLOGIC: Cranial nerves II through XII are intact. Muscle strength 5/5 in all extremities. Sensation intact. Gait not checked.  PSYCHIATRIC: The patient is alert and oriented x 3.  SKIN: No obvious rash, lesion, or ulcer.  Upper extremity edema was noted, however, no significant edema in lower extremities or crepitations in he lungs were ausculted.  ORDERS/RESULTS REVIEWED:   CBC  Recent Labs Lab 04/07/15 0556 04/07/15 2240 04/08/15 0654 04/08/15 1659 04/09/15 0349 04/09/15 1703 04/10/15 0430  WBC 7.0 14.0* 16.9* 13.5* 16.5*  --   --   HGB 8.8* 6.9*  6.9* 7.5* 7.5* 7.7* 8.0* 6.6*  HCT 27.2* 21.3* 23.4* 23.0* 22.7*  --   --   PLT 518* 634* 514* 360 387  --   --   MCV 88.1 88.3 89.4 89.1 89.0  --   --   MCH 28.4 28.8 28.7 29.2 30.1  --   --   MCHC 32.3 32.6 32.1 32.8 33.8  --   --   RDW 19.6* 20.1* 18.3* 17.1* 17.8*  --   --    ------------------------------------------------------------------------------------------------------------------  Chemistries   Recent Labs Lab 04/05/15 0432 04/06/15 0411  NA 130* 129*  K 3.9 3.9  CL 96* 97*  CO2 26 27  GLUCOSE 98 118*  BUN 15 10  CREATININE 0.72 0.70  CALCIUM 8.4* 8.2*  MG 2.2  --    ------------------------------------------------------------------------------------------------------------------ estimated creatinine clearance is 74.8 mL/min (by C-G formula based on Cr of 0.7). ------------------------------------------------------------------------------------------------------------------ No results for input(s): TSH, T4TOTAL, T3FREE, THYROIDAB in the last 72 hours.  Invalid  input(s): FREET3  Cardiac Enzymes No results for input(s): CKMB, TROPONINI, MYOGLOBIN in the last 168 hours.  Invalid input(s): CK ------------------------------------------------------------------------------------------------------------------ Invalid input(s):  POCBNP --------------------------------------------------------------------------------------------------------------- Cultures are growing of 4 out of 4. Staphylococcus aureus. Sensitivities are pending.  RADIOLOGY: Dg Chest 2 View  04/09/2015   CLINICAL DATA:  81 year old male with fever. History of cardiac valve replacement.  EXAM: CHEST  2 VIEW  COMPARISON:  10/30/2014.  FINDINGS: Cardiomegaly cardiac valve replacement changes noted.  Patchy opacity at the medial right lung base may represent airspace disease or atelectasis.  There is no evidence of pleural effusion,, pulmonary edema, pneumothorax or pulmonary mass.  No acute bony abnormalities are noted.  IMPRESSION: Patchy medial right lung base opacity -question atelectasis versus airspace disease/ pneumonia.  Cardiomegaly.   Electronically Signed   By: Harmon PierJeffrey  Hu M.D.   On: 04/09/2015 16:49   Dg Chest Port 1 View  04/09/2015   CLINICAL DATA:  PICC line placement  EXAM: PORTABLE CHEST - 1 VIEW  COMPARISON:  04/09/2015  FINDINGS: Sternotomy wires overlie normal cardiac silhouette. Interval placement of a right PICC line with tip in the distal SVC. Mild basilar atelectasis.  IMPRESSION: Right PICC line in appropriate position.   Electronically Signed   By: Genevive BiStewart  Edmunds M.D.   On: 04/09/2015 17:17    EKG:  Orders placed or performed during the hospital encounter of 04/02/15  . EKG 12-Lead  . EKG 12-Lead    ASSESSMENT AND PLAN: Ty HiltsSydnor Fulco is a 81 y.o. male with a known history of hypertension, restless leg syndrome, mechanical aortic valve replacement surgery, osteoarthritis requiring bilateral hip replacement surgeries and a recent left total hip replacement surgery on 03/21/2015. [facility] and was brought in secondary to weakness and noted to be anemic *Staphylococcus aureus bacteremia of unclear origin. We'll take blood cultures again today and will continue vancomycin for now. We are going to ask  infectious disease specialist, Dr. Vanna ScotlandFitzgeraldl to see patient in consultation and will get echocardiogram to rule out endocarditis.  * Acute posthemorrhagic on chronic GIB loss  anemia- from recent THR with Cameron erosions per EGD, with coumadin use -baseline hemoglobin is around 13, hemoglobin 2 weeks ago at the time of discharge after his THR surgery was around 8. Hemoccult is positive. Hemoglobin has improved after PRBC unit transfusion. However, now it's down again due to GI bleed.  Will order 1 more unit of packed red blood cell transfusion today .Marland Kitchen. Appreciate GI and cardiology Dr. Windell HummingbirdGollan's consult. Monitoring INR,  today at 2.2 , order vit K  1 mg today . IV following ProTime INR tomorrow in the morning, we'll continue the patient on Protonix IV,. octreotide and Carafate. Patient will be on clear liquid diet as recommended by GI. Monitoring CBC. Patient had iron sucrose infusion in the recent past. Patient needs outpatient colonoscopy in 2-3 weeks approximately as per GI * Hypotension with h/o essential HTN-blood pressure now is low due to bleeding, may resume his valsartan and Norvasc. Continue to monitor. Dc IVF * Mechanical aortic valve replacement- goal INR 2-3 , but with his GI bleed and anemia, holding Coumadin at this time. Pharmacy consult is placed for Coumadin management. May restart Coumadin again if bleeding subsides * leukocytosis, thought to be stress related , but now patient has bacteremia, continue antibiotic with levofloxacin as well as vancomycin as above and follow-up white blood cell count in the morning.  * coagulopathy due to coumadin, hold coumadin, following clinically *  Diarrhea, likely related to GI bleed. However, cannot rule out infectious. We will be getting stool cultures including C. Difficile.   Management plans discussed with the patient, family- daughter, DR Sampson GoonFitzgerald  and they are in agreement.   DRUG ALLERGIES:  Allergies  Allergen Reactions  . Sulfa  Antibiotics     unknown    CODE STATUS:     Code Status Orders        Start     Ordered   04/02/15 2123  Full code   Continuous     04/02/15 2122    Advance Directive Documentation        Most Recent Value   Type of Advance Directive  Healthcare Power of Attorney, Living will   Pre-existing out of facility DNR order (yellow form or pink MOST form)     "MOST" Form in Place?        TOTAL TIME TAKING CARE OF THIS PATIENT: 50 minutes.   Extensive discussion this patient's daughter, patient's condition, treatment plan, evaluation plan as well as follow-up and pending  consults, voiced understanding and appreciation , time spent additional 10-15 minutes in discussion. Emotional support provided.  Katharina CaperVAICKUTE,Purva Vessell M.D on 04/10/2015 at 2:23 PM  Between 7am to 6pm - Pager - [phone]  After 6pm go to [email or web address] - password [email or web address] [facility]  [facility]  Office  [phone]  CC: Primary care physician; [name], [name], MD

## 2015-04-10 NOTE — Consult Note (Signed)
Patient with GI bleeding from gastric sources and possibly in lower GI area also.  He needs the anticoagulants for his heart valve yet the anticoagulation   Has contributed to  significant bleeding which has required several units of blood.  The Gi bleeding must be stopped or allowed to stop and remain stopped even at a possible risk from his heart valve causing clots and emboli which could possibly cause a stroke.  I do not know of any other alternative.  I spoke with Dr. Sherryll BurgerShah and his daughter of the severity of the situation that does not have a risk free pathway to improvement.

## 2015-04-10 NOTE — Progress Notes (Signed)
Dr. Betti Cruzeddy notified of positive blood culture: gram positive cocci in both anaerobic and aerobic bottles. Pt is on levaquin. No new orders.

## 2015-04-10 NOTE — Consult Note (Signed)
Reason for Consult: Staph aureus bacteremia    Referring Physician Shah, vipul  Principal Problem:   Anemia  Patient Active Problem List   Diagnosis Date Noted  . Anemia 04/02/2015  . GERD (gastroesophageal reflux disease)   . Bradycardia   . Depression   . Anxiety   . SOB (shortness of breath)   . Hypertension   . Decreased hearing   . Coronary artery disease   . Aortic stenosis   . S/P AVR   . Warfarin anticoagulation   . Ejection fraction   . Carotid bruit     HPI: [name] is a 81 y.o. male admitted 5/8 with post op anemia and GIB. He had hip fx in Dec and had ORIF at that time.  Did ok but then developed AVN and needed THR which he underwent ast week April .  Was at [facility] when found to have hypotension, GIB  and anemia. He has been seen by GI and bleeding seems to have stabliized. Developed fever and leukocytosis on 5/14 and found to have BCX + for staph aureus.  He does have a mechanical aortic valve.  He currently feels weak but no further fevers.  Has pain at R wrist IV site.  No hip pain. Wound is well healed. Had picc placed 5/15.   Past Medical History  Diagnosis Date  . GERD (gastroesophageal reflux disease)   . Bradycardia     chronic, no symptoms 07/2010  . Depression   . Anxiety 10/11  . SOB (shortness of breath) 10/11    08/2010,Episodes at 5 AM, eventually felt to be anxiety, after complete workup including catheterization, pt greatly improved with anxiety meds 11/11  . Hypertension     BP higher than usual 04/19/10; amlodipine increased by telephone  . Decreased hearing     Right ear  . Coronary artery disease     mild, cath, 08/2010  . Aortic stenosis     a. s/p mechcanical AVR, 2002  . S/P AVR     a. [employer]. Jude. mechanical 2002; b. echo 08/2010 EF 60%, trival AI, mild MR, AVR working well; c. on longterm warfarin tx  . Carotid bruit     dopplers in past, no abnormalities   Past Surgical History  Procedure Laterality Date  . Valve  replacement  1/02    Aortic; echo 3/09 valve working well; echo 10/11 working well; put on Coumadin  . Cardiac catheterization    . Joint replacement    . Hernia repair    . Esophagogastroduodenoscopy N/A 04/05/2015    Procedure: ESOPHAGOGASTRODUODENOSCOPY (EGD);  Surgeon: Robert T Elliott, MD;  Location: [facility] ENDOSCOPY;  Service: Endoscopy;  Laterality: N/A;    Allergies:  Allergies  Allergen Reactions  . Sulfa Antibiotics     unknown    Current antibiotics: Antibiotics Given (last 72 hours)    Date/Time Action Medication Dose Rate   04/09/15 1706 Given   levofloxacin (LEVAQUIN) IVPB 500 mg 500 mg 100 mL/hr   04/10/15 1347 Given   levofloxacin (LEVAQUIN) IVPB 500 mg 500 mg 100 mL/hr      MEDICATIONS: . sodium chloride   Intravenous Once  . amLODipine  10 mg Oral Daily  . citalopram  20 mg Oral Daily  . feeding supplement (ENSURE ENLIVE)  237 mL Oral BID BM  . finasteride  5 mg Oral Daily  . irbesartan  75 mg Oral Daily  . lactulose  20 g Oral BID  . levofloxacin (LEVAQUIN) IV    500 mg Intravenous Q24H  . multivitamin  1 tablet Oral Daily  . phytonadione  1 mg Intramuscular Once  . rOPINIRole  1 mg Oral TID  . sodium chloride  3 mL Intravenous Q12H  . sodium phosphate  1 enema Rectal Once  . sucralfate  1 g Oral TID WC & HS  . tamsulosin  0.4 mg Oral Daily  . traZODone  50 mg Oral QHS  . vancomycin  1,250 mg Intravenous Once    History  Substance Use Topics  . Smoking status: Former Research scientist (life sciences)  . Smokeless tobacco: Not on file  . Alcohol Use: 1.2 oz/week    0 Standard drinks or equivalent, 2 Glasses of wine per week     Comment: Social    Family History  Problem Relation Age of Onset  . Hypertension      Review of Systems - 11 systems reviewed and negative per HPI   OBJECTIVE: Temp:  [98.1 F (36.7 C)-101.9 F (38.8 C)] 98.3 F (36.8 C) (05/16 1511) Pulse Rate:  [40-95] 90 (05/16 1511) Resp:  [18-20] 20 (05/16 1511) BP: (91-148)/(44-64) 132/64 mmHg  (05/16 1511) SpO2:  [91 %-100 %] 100 % (05/16 1315)  GENERAL:lying in the bed with no acute distress. Pale appearing EYES: Pupils equal, round, reactive to light and accommodation. No scleral icterus. Extraocular muscles intact.  HEENT: Head atraumatic, normocephalic. Oropharynx and nasopharynx clear.  NECK: Supple, no jugular venous distention. No thyroid enlargement, no tenderness.  LUNGS: Normal breath sounds bilaterally, no wheezing, rales,rhonchi or crepitation. No use of accessory muscles of respiration.  CARDIOVASCULAR: S1, S2 normal. Av mech sound.  ABDOMEN: Soft, nontender, nondistended. Bowel sounds present. No organomegaly or mass.  EXTREMITIES: No pedal edema, cyanosis, or clubbing. Left hip pain on flexing. No focal erythema - site well healed  NEUROLOGIC: Cranial nerves II through XII are intact. Muscle strength 5/5 in all extremities. Sensation intact. Gait not checked.  PSYCHIATRIC: The patient is alert and oriented x 3.  SKIN: L wrist dorsum with area of induration and erythema at old IV site  LABS: Results for orders placed or performed during the hospital encounter of 04/02/15 (from the past 48 hour(s))  CBC     Status: Abnormal   Collection Time: 04/08/15  4:59 PM  Result Value Ref Range   WBC 13.5 (H) 3.8 - 10.6 K/uL   RBC 2.58 (L) 4.40 - 5.90 MIL/uL   Hemoglobin 7.5 (L) 13.0 - 18.0 g/dL   HCT 23.0 (L) 40.0 - 52.0 %   MCV 89.1 80.0 - 100.0 fL   MCH 29.2 26.0 - 34.0 pg   MCHC 32.8 32.0 - 36.0 g/dL   RDW 17.1 (H) 11.5 - 14.5 %   Platelets 360 150 - 440 K/uL  Protime-INR     Status: Abnormal   Collection Time: 04/09/15  3:49 AM  Result Value Ref Range   Prothrombin Time 21.8 (H) 11.4 - 15.0 seconds   INR 1.88   CBC     Status: Abnormal   Collection Time: 04/09/15  3:49 AM  Result Value Ref Range   WBC 16.5 (H) 3.8 - 10.6 K/uL   RBC 2.55 (L) 4.40 - 5.90 MIL/uL   Hemoglobin 7.7 (L) 13.0 - 18.0 g/dL   HCT 22.7 (L) 40.0 - 52.0 %   MCV 89.0 80.0 - 100.0  fL   MCH 30.1 26.0 - 34.0 pg   MCHC 33.8 32.0 - 36.0 g/dL   RDW 17.8 (H) 11.5 - 14.5 %  Platelets 387 150 - 440 K/uL  Hemoglobin     Status: Abnormal   Collection Time: 04/09/15  5:03 PM  Result Value Ref Range   Hemoglobin 8.0 (L) 13.0 - 18.0 g/dL  Culture, blood (routine x 2)     Status: None (Preliminary result)   Collection Time: 04/09/15  5:03 PM  Result Value Ref Range   Specimen Description BLOOD    Special Requests Normal    Culture  Setup Time      GRAM POSITIVE COCCI IN BOTH AEROBIC AND ANAEROBIC BOTTLES CRITICAL RESULT CALLED TO, READ BACK BY AND VERIFIED WITH: BECKU REAP AT 0272 04/10/15. TSH IDENTIFICATION TO FOLLOW CONFIRMED BY [facility]    Culture      STAPHYLOCOCCUS AUREUS IN BOTH AEROBIC AND ANAEROBIC BOTTLES SUSCEPTIBILITIES TO FOLLOW    Report Status PENDING   Culture, blood (routine x 2)     Status: None (Preliminary result)   Collection Time: 04/09/15  5:03 PM  Result Value Ref Range   Specimen Description BLOOD    Special Requests Normal    Culture  Setup Time      GRAM POSITIVE COCCI IN BOTH AEROBIC AND ANAEROBIC BOTTLES CRITICAL RESULT CALLED TO, READ BACK BY AND VERIFIED WITH: BECKY REAP AT 5366 04/10/15. TSH IDENTIFICATION TO FOLLOW CONFIRMED BY MPG    Culture      STAPHYLOCOCCUS AUREUS IN BOTH AEROBIC AND ANAEROBIC BOTTLES SUSCEPTIBILITIES TO FOLLOW    Report Status PENDING   Protime-INR     Status: Abnormal   Collection Time: 04/10/15  4:30 AM  Result Value Ref Range   Prothrombin Time 24.6 (H) 11.4 - 15.0 seconds   INR 2.20   Hemoglobin     Status: Abnormal   Collection Time: 04/10/15  4:30 AM  Result Value Ref Range   Hemoglobin 6.6 (L) 13.0 - 18.0 g/dL  Creatinine, serum     Status: None   Collection Time: 04/10/15  2:05 PM  Result Value Ref Range   Creatinine, Ser 0.62 0.61 - 1.24 mg/dL   GFR calc non Af Amer >60 >60 mL/min   GFR calc Af Amer >60 >60 mL/min    Comment: (NOTE) The eGFR has been calculated using the CKD EPI  equation. This calculation has not been validated in all clinical situations. eGFR's persistently <60 mL/min signify possible Chronic Kidney Disease.     IMAGING: Dg Chest 2 View  04/09/2015   CLINICAL DATA:  81 year old male with fever. History of cardiac valve replacement.  EXAM: CHEST  2 VIEW  COMPARISON:  10/30/2014.  FINDINGS: Cardiomegaly cardiac valve replacement changes noted.  Patchy opacity at the medial right lung base may represent airspace disease or atelectasis.  There is no evidence of pleural effusion,, pulmonary edema, pneumothorax or pulmonary mass.  No acute bony abnormalities are noted.  IMPRESSION: Patchy medial right lung base opacity -question atelectasis versus airspace disease/ pneumonia.  Cardiomegaly.   Electronically Signed   By: Margarette Canada M.D.   On: 04/09/2015 16:49   Dg Chest Port 1 View  04/09/2015   CLINICAL DATA:  PICC line placement  EXAM: PORTABLE CHEST - 1 VIEW  COMPARISON:  04/09/2015  FINDINGS: Sternotomy wires overlie normal cardiac silhouette. Interval placement of a right PICC line with tip in the distal SVC. Mild basilar atelectasis.  IMPRESSION: Right PICC line in appropriate position.   Electronically Signed   By: Suzy Bouchard M.D.   On: 04/09/2015 17:17    HISTORICAL MICRO/IMAGING  Assessment/Plan:   81  yo with mechanical aortic valve admitted with GIB.  Has developed Staph aureus bacteremia likely from a L wrist thrombophlebitis.  Currently afebrile. He has no evidence that he has seeded his newly placed THR on L or his Aortic valve but will need close monitoring as there is high risk of this.   Given the acute infection with a source and rapid treatment hopefully will avoid further seeding.  Rec Continue vanco Stop levofloxacin Repeat bcx to document clearance. Check ESR in AM Check TTE - vegetations usually do not appear acutely so even if negative will need to monitor for endocarditis Will need 2 week course at least of IV abx Will  likely need picc changed prior to dc as was placed likely in setting of active bacteremia. Continue Ice pack to L IV site.   

## 2015-04-10 NOTE — Progress Notes (Signed)
Per RN patient is not medically stable for D/C today. Clinical Social Worker (CSW) contacted Amy admissions coordinator at [facility] and made her aware of above and that patient has a PICC Line. Plan is for patient to [facility] when medically stable. CSW will continue to follow and assist as needed.   Jetta LoutBailey Morgan, LCSWA [phone]

## 2015-04-10 NOTE — Progress Notes (Signed)
Pt afebrile this shift. Pt has has several loose BM. Hemoglobin 6.6. Pt received 1 unit of RBC. Receiving scheduled antibiotics. Will continue to monitor.

## 2015-04-10 NOTE — Progress Notes (Signed)
RD Assessment  Admitted with: weakness PMHx:  Past Medical History  Diagnosis Date  . GERD (gastroesophageal reflux disease)   . Bradycardia     chronic, no symptoms 07/2010  . Depression   . Anxiety 10/11  . SOB (shortness of breath) 10/11    08/2010,Episodes at 5 AM, eventually felt to be anxiety, after complete workup including catheterization, pt greatly improved with anxiety meds 11/11  . Hypertension     BP higher than usual 04/19/10; amlodipine increased by telephone  . Decreased hearing     Right ear  . Coronary artery disease     mild, cath, 08/2010  . Aortic stenosis     a. s/p mechcanical AVR, 2002  . S/P AVR     a. St. Jude. mechanical 2002; b. echo 08/2010 EF 60%, trival AI, mild MR, AVR working well; c. on longterm warfarin tx  . Carotid bruit     dopplers in past, no abnormalities   Current Diet: Clear Liquid Typical Food/ Fluid Intake: 50%, poor intake noted since admission Meal/ Snack Patterns: Unable to assess  Supplements: Ensure BID- currently on CL diet  Food Allergies: NKFA Food Preferences: Reviewed  Ht: 70" Current weight: 190# BMI: 27.3 Weight Changes: Unable to assess weight changes  UOP:   Intake/Output Summary (Last 24 hours) at 04/10/15 1622 Last data filed at 04/10/15 1508  Gross per 24 hour  Intake 3357.58 ml  Output    200 ml  Net 3157.58 ml    Digestive: Last BM- 5/15 Gastrointestinal: reviewed Skin: reviewed, no concerns Physical Findings: n/a  Labs: Electrolyte and Renal Profile:    Recent Labs Lab 04/05/15 0432 04/06/15 0411 04/10/15 1405  BUN 15 10  --   CREATININE 0.72 0.70 0.62  NA 130* 129*  --   K 3.9 3.9  --   MG 2.2  --   --    Glucose Profile: No results for input(s): GLUCAP in the last 72 hours.  Meds: MVI  PES Statement: will re-assess on follow Intervention:  Meals and Snacks: Cater to patient preferences Medical Nutrition Supplement: Boost Breeze while on clear liquid diet  Monitoring/  Evaluation: Energy Intake: goal for patient to meet >90% of estimated needs.  [name], RDN Pager: [phone] Office: [phone]   LOW Care Level

## 2015-04-10 NOTE — Progress Notes (Signed)
[facility] provided spiritual support.  [facility] Trey PaulaJeff [phone]) [phone]   04/10/15 1300  Clinical Encounter Type  Visited With Patient  Visit Type Spiritual support  Spiritual Encounters  Spiritual Needs (Pastoral Care)  Stress Factors  Patient Stress Factors Health changes  Family Stress Factors Health changes

## 2015-04-10 NOTE — Care Management (Signed)
Hgb 6.6; pRBC,fever, low blood pressure at times per RN but pulse stable. Patient is pallor and weak per RN. PICC in place for IV access. [facility] when stable. RN to follow up with Gi.

## 2015-04-10 NOTE — Progress Notes (Signed)
Dr Sherryll BurgerShah notified of positive cdiff,states he will put in appropriate orders.

## 2015-04-10 NOTE — Progress Notes (Signed)
Physical Therapy Treatment Patient Details Name: [name] MRN: [id number] DOB: 10-Dec-1930 Today's Date: 04/10/2015    History of Present Illness pt had L hip replacement 4/26 now back with anemia    PT Comments    [name] Hg 6.6 this AM and s/p 1 unit PRBC transfusion today.  Nursing also reports pt having low BP today.  Nursing cleared pt for participation in PT but recommended in bed ex's only this session.  Pt tolerated ex's fairly well but still fatigued and required some rest breaks.  Follow Up Recommendations  SNF     Equipment Recommendations  Rolling walker with 5" wheels    Recommendations for Other Services       Precautions / Restrictions Precautions Precautions: Fall Restrictions Weight Bearing Restrictions: Yes LLE Weight Bearing: Weight bearing as tolerated    Mobility  Bed Mobility                  Transfers                    Ambulation/Gait                 Stairs            Wheelchair Mobility    Modified Rankin (Stroke Patients Only)       Balance                                    Cognition Arousal/Alertness: Awake/alert Behavior During Therapy: WFL for tasks assessed/performed Overall Cognitive Status: Within Functional Limits for tasks assessed                      Exercises   Performed semi-supine B LE therapeutic exercise x 20 reps:  Ankle pumps (AROM B LE's); quad sets x3 second holds (AROM B LE's); glute squeezes x3 second holds (AROM B); SAQ's (AROM R; AROM L); heelslides (AROM R; AAROM L), hip abd/adduction (AROM R; AAROM L), and SLR (AROM R; AAROM L).  Pt required vc's and tactile cues for correct technique with exercises.     General Comments  Pt agreeable to PT session and reporting feeling weak today but will feel better when his Hg number is up.      Pertinent Vitals/Pain Pain Assessment: 0-10 Pain Score: 5  (0/10 at rest; 5/10 with ex's) Pain Location: L  thigh/hip Pain Intervention(s): Limited activity within patient's tolerance;Monitored during session;Repositioned  BP 132/64 HR 88-100 bpm during session.    Home Living                      Prior Function            PT Goals (current goals can now be found in the care plan section) Acute Rehab PT Goals Patient Stated Goal: To get back to rehab. PT Goal Formulation: With patient Time For Goal Achievement: 04/17/15 Potential to Achieve Goals: Good Progress towards PT goals: PT to reassess next treatment (bed ex's only today; reassess mobility next session)    Frequency  7X/week    PT Plan Current plan remains appropriate    Co-evaluation             End of Session   Activity Tolerance: Patient tolerated treatment well Patient left: in bed;with call bell/phone within reach;with bed alarm set (heels elevated via pillow)     Time:  [id number] PT Time Calculation (min) (ACUTE ONLY): 25 min  Charges:  $Therapeutic Exercise: 23-37 mins                    G CodesHendricks Limes:      [name] 04/10/2015, 3:42 PM Hendricks LimesEmily Stephenia Vogan, PT [phone]

## 2015-04-10 NOTE — Progress Notes (Addendum)
Dr. Betti Cruzeddy notified of hemoglobin 6.6 and BP 91/44, orders to continue to monitor closely, call MD for any change in vital signs and have day shift MD assess for further interventions needed.

## 2015-04-10 NOTE — Progress Notes (Signed)
ANTIBIOTIC CONSULT NOTE - INITIAL  Pharmacy Consult for Vancomycin Indication: bacteremia  Allergies  Allergen Reactions  . Sulfa Antibiotics     unknown    Patient Measurements: Height: 5\' 10"  (177.8 cm) Weight: 190 lb (86.183 kg) IBW/kg (Calculated) : 73 Adjusted Body Weight: 78.3kg  Vital Signs: Temp: 98.3 F (36.8 C) (05/16 1511) Temp Source: Oral (05/16 1511) BP: 132/64 mmHg (05/16 1511) Pulse Rate: 90 (05/16 1511) Intake/Output from previous day: 05/15 0701 - 05/16 0700 In: 1918.8 [I.V.:1818.8; IV Piggyback:100] Out: 400 [Urine:400] Intake/Output from this shift: Total I/O In: 1328.8 [P.O.:360; I.V.:594.8; Blood:374] Out: -   Labs:  Recent Labs  04/08/15 0654 04/08/15 1659 04/09/15 0349 04/09/15 1703 04/10/15 0430 04/10/15 1405  WBC 16.9* 13.5* 16.5*  --   --   --   HGB 7.5* 7.5* 7.7* 8.0* 6.6*  --   PLT 514* 360 387  --   --   --   CREATININE  --   --   --   --   --  0.62   Estimated Creatinine Clearance: 74.8 mL/min (by C-G formula based on Cr of 0.62). No results for input(s): VANCOTROUGH, VANCOPEAK, VANCORANDOM, GENTTROUGH, GENTPEAK, GENTRANDOM, TOBRATROUGH, TOBRAPEAK, TOBRARND, AMIKACINPEAK, AMIKACINTROU, AMIKACIN in the last 72 hours.   Microbiology: Recent Results (from the past 720 hour(s))  MRSA PCR Screening     Status: None   Collection Time: 03/20/15  1:10 PM  Result Value Ref Range Status   Micro Text Report   Final       COMMENT                   NEGATIVE - MRSA target DNA not detected   ANTIBIOTIC                                                      Culture, blood (routine x 2)     Status: None (Preliminary result)   Collection Time: 04/09/15  5:03 PM  Result Value Ref Range Status   Specimen Description BLOOD  Final   Special Requests Normal  Final   Culture  Setup Time   Final    GRAM POSITIVE COCCI IN BOTH AEROBIC AND ANAEROBIC BOTTLES CRITICAL RESULT CALLED TO, READ BACK BY AND VERIFIED WITH: BECKU REAP AT [id number] 04/10/15.  TSH IDENTIFICATION TO FOLLOW CONFIRMED BY MPG    Culture   Final    STAPHYLOCOCCUS AUREUS IN BOTH AEROBIC AND ANAEROBIC BOTTLES SUSCEPTIBILITIES TO FOLLOW    Report Status PENDING  Incomplete  Culture, blood (routine x 2)     Status: None (Preliminary result)   Collection Time: 04/09/15  5:03 PM  Result Value Ref Range Status   Specimen Description BLOOD  Final   Special Requests Normal  Final   Culture  Setup Time   Final    GRAM POSITIVE COCCI IN BOTH AEROBIC AND ANAEROBIC BOTTLES CRITICAL RESULT CALLED TO, READ BACK BY AND VERIFIED WITH: BECKY REAP AT [id number] 04/10/15. TSH IDENTIFICATION TO FOLLOW CONFIRMED BY MPG    Culture   Final    STAPHYLOCOCCUS AUREUS IN BOTH AEROBIC AND ANAEROBIC BOTTLES SUSCEPTIBILITIES TO FOLLOW    Report Status PENDING  Incomplete    Medical History: Past Medical History  Diagnosis Date  . GERD (gastroesophageal reflux disease)   . Bradycardia     chronic, no  symptoms 07/2010  . Depression   . Anxiety 10/11  . SOB (shortness of breath) 10/11    08/2010,Episodes at 5 AM, eventually felt to be anxiety, after complete workup including catheterization, pt greatly improved with anxiety meds 11/11  . Hypertension     BP higher than usual 04/19/10; amlodipine increased by telephone  . Decreased hearing     Right ear  . Coronary artery disease     mild, cath, 08/2010  . Aortic stenosis     a. s/p mechcanical AVR, 2002  . S/P AVR     a. St. Jude. mechanical 2002; b. echo 08/2010 EF 60%, trival AI, mild MR, AVR working well; c. on longterm warfarin tx  . Carotid bruit     dopplers in past, no abnormalities    Medications:  Anti-infectives    Start     Dose/Rate Route Frequency Ordered Stop   04/10/15 2300  vancomycin (VANCOCIN) 1,250 mg in sodium chloride 0.9 % 250 mL IVPB     1,250 mg 166.7 mL/hr over 90 Minutes Intravenous Every 12 hours 04/10/15 1524     04/10/15 1115  vancomycin (VANCOCIN) 1,250 mg in sodium chloride 0.9 % 250 mL IVPB      1,250 mg 166.7 mL/hr over 90 Minutes Intravenous  Once 04/10/15 1113     04/09/15 1430  levofloxacin (LEVAQUIN) IVPB 500 mg     500 mg 100 mL/hr over 60 Minutes Intravenous Every 24 hours 04/09/15 1353     04/04/15 1200  piperacillin-tazobactam (ZOSYN) IVPB 3.375 g  Status:  Discontinued     3.375 g 100 mL/hr over 30 Minutes Intravenous  Once 04/03/15 1534 04/03/15 1555     Assessment: 81 y/o M receiving vancomycin for staph bacteremia.  PKA: ke=0/066, t1/2=10.5, Vd= 60 L  Goal of Therapy:  Vancomycin trough level 15-20 mcg/ml  Plan:  Will order vancomycin IV 1250 mg x 1 at 1600, to be followed by vancomycin 1250mg  IV q12h at 2300 for ~6 hour stacked dosing. Will order trough prior to dose on 5/18 at 1030. Measure antibiotic drug levels at steady state Follow up culture results  Felizardo HoffmannJacobsen,[place] Norberto M 04/10/2015,3:25 PM

## 2015-04-11 ENCOUNTER — Inpatient Hospital Stay: Payer: Medicare Other

## 2015-04-11 DIAGNOSIS — Z952 Presence of prosthetic heart valve: Secondary | ICD-10-CM | POA: Insufficient documentation

## 2015-04-11 DIAGNOSIS — D500 Iron deficiency anemia secondary to blood loss (chronic): Secondary | ICD-10-CM | POA: Insufficient documentation

## 2015-04-11 DIAGNOSIS — K922 Gastrointestinal hemorrhage, unspecified: Secondary | ICD-10-CM | POA: Insufficient documentation

## 2015-04-11 LAB — CBC
HCT: 22.1 % — ABNORMAL LOW (ref 40.0–52.0)
Hemoglobin: 7.3 g/dL — ABNORMAL LOW (ref 13.0–18.0)
MCH: 30.1 pg (ref 26.0–34.0)
MCHC: 33.1 g/dL (ref 32.0–36.0)
MCV: 90.8 fL (ref 80.0–100.0)
Platelets: 210 10*3/uL (ref 150–440)
RBC: 2.43 MIL/uL — AB (ref 4.40–5.90)
RDW: 16.3 % — AB (ref 11.5–14.5)
WBC: 4.4 10*3/uL (ref 3.8–10.6)

## 2015-04-11 LAB — CULTURE, BLOOD (ROUTINE X 2)
SPECIAL REQUESTS: NORMAL
Special Requests: NORMAL

## 2015-04-11 LAB — URINE CULTURE
Culture: NO GROWTH
Special Requests: NORMAL

## 2015-04-11 LAB — SEDIMENTATION RATE: Sed Rate: 37 mm/hr — ABNORMAL HIGH (ref 0–20)

## 2015-04-11 LAB — PROTIME-INR
INR: 1.41
Prothrombin Time: 17.5 seconds — ABNORMAL HIGH (ref 11.4–15.0)

## 2015-04-11 MED ORDER — CEFAZOLIN SODIUM-DEXTROSE 2-3 GM-% IV SOLR
2.0000 g | Freq: Three times a day (TID) | INTRAVENOUS | Status: DC
  Administered 2015-04-11 – 2015-04-13 (×7): 2 g via INTRAVENOUS
  Filled 2015-04-11 (×10): qty 50

## 2015-04-11 MED ORDER — SODIUM CHLORIDE 0.9 % IJ SOLN
10.0000 mL | Freq: Two times a day (BID) | INTRAMUSCULAR | Status: DC
  Administered 2015-04-11 – 2015-04-12 (×4): 10 mL via INTRAVENOUS

## 2015-04-11 NOTE — Progress Notes (Signed)
Per RN patient is not medically stable for D/C today and is positive for C-Diff. Clinical Child psychotherapistocial Worker (CSW) made [facility] admissions coordinator [facility] aware of above. Plan is [facility]. CSW will continue to follow and assist as needed.   Jetta LoutBailey Morgan, LCSWA [phone]

## 2015-04-11 NOTE — Progress Notes (Signed)
Pt is alert and oriented, VSS, afebrile throughout shift. Nausea and anxiety improved with medication on MAR. Dry heaving with minimal emesis, emesis is blood tinged. Multiple bloody stools throughout shift. Sleeping between care, will continue to monitor.

## 2015-04-11 NOTE — Consult Note (Signed)
In an article in UP TO Date dated 02/2015 it said that the long term incidence of symptomatic systemic embolization is about 2 %  per year with asprin use and 4 % per year with NO antithrombotic medication.  I recommend leave him off anticoagulation for 2 weeks and do colonoscopy and repeat EGD.  Please contact me to discuss this.

## 2015-04-11 NOTE — Progress Notes (Signed)
ID: [name] is a 81 y.o. male with  Staph bacteremia, C diff  Principal Problem:   Anemia Active Problems:   Blood loss anemia   Bleeding gastrointestinal   S/P AVR (aortic valve replacement)  Subjective: Feels a little stronger, diarrhea slowing. No fevers.   Medications:  . sodium chloride   Intravenous Once  . amLODipine  10 mg Oral Daily  . citalopram  20 mg Oral Daily  . finasteride  5 mg Oral Daily  . irbesartan  75 mg Oral Daily  . metroNIDAZOLE  500 mg Oral 3 times per day  . multivitamin  1 tablet Oral Daily  . rOPINIRole  1 mg Oral TID  . sodium chloride  10 mL Intravenous Q12H  . sodium chloride  3 mL Intravenous Q12H  . sucralfate  1 g Oral TID WC & HS  . tamsulosin  0.4 mg Oral Daily  . traZODone  50 mg Oral QHS  . vancomycin  1,250 mg Intravenous Q12H    Objective: Vital signs in last 24 hours: Temp:  [98.3 F (36.8 C)-99.5 F (37.5 C)] 99.5 F (37.5 C) (05/17 0847) Pulse Rate:  [90-95] 95 (05/17 0847) Resp:  [18-20] 18 (05/17 0500) BP: (131-142)/(55-64) 142/56 mmHg (05/17 0847) SpO2:  [95 %-100 %] 95 % (05/17 0847)  GENERAL:lying in the bed with no acute distress. Pale appearing EYES: Pupils equal, round, reactive to light and accommodation. No scleral icterus. Extraocular muscles intact.  HEENT: Head atraumatic, normocephalic. Oropharynx and nasopharynx clear.  NECK: Supple, no jugular venous distention. No thyroid enlargement, no tenderness.  LUNGS: Normal breath sounds bilaterally, no wheezing, rales,rhonchi or crepitation. No use of accessory muscles of respiration.  CARDIOVASCULAR: S1, S2 normal. Av mech sound.  ABDOMEN: Soft, nontender, nondistended. Bowel sounds present. No organomegaly or mass.  EXTREMITIES: No pedal edema, cyanosis, or clubbing. Left hip pain on flexing. No focal erythema - site well healed  NEUROLOGIC: Cranial nerves II through XII are intact. Muscle strength 5/5 in all extremities. Sensation intact. Gait  not checked.  PSYCHIATRIC: The patient is alert and oriented x 3.  SKIN: L wrist dorsum with area of induration and erythema at old IV site  Lab Results  Recent Labs  04/09/15 0349  04/10/15 0430 04/10/15 1405 04/11/15 0636  WBC 16.5*  --   --   --  4.4  HGB 7.7*  < > 6.6*  --  7.3*  HCT 22.7*  --   --   --  22.1*  CREATININE  --   --   --  0.62  --   < > = values in this interval not displayed.  Microbiology: Results for orders placed or performed during the hospital encounter of 04/02/15  Culture, blood (routine x 2)     Status: None   Collection Time: 04/09/15  5:03 PM  Result Value Ref Range Status   Specimen Description BLOOD  Final   Special Requests Normal  Final   Culture  Setup Time   Final    GRAM POSITIVE COCCI IN BOTH AEROBIC AND ANAEROBIC BOTTLES CRITICAL RESULT CALLED TO, READ BACK BY AND VERIFIED WITH: BECKU REAP AT 1062 04/10/15. TSH IDENTIFICATION TO FOLLOW CONFIRMED BY MPG    Culture   Final    STAPHYLOCOCCUS AUREUS IN BOTH AEROBIC AND ANAEROBIC BOTTLES    Report Status 04/11/2015 FINAL  Final   Organism ID, Bacteria STAPHYLOCOCCUS AUREUS  Final      Susceptibility   Staphylococcus aureus - MIC*  CIPROFLOXACIN <=0.5 SENSITIVE Sensitive     ERYTHROMYCIN <=0.25 SENSITIVE Sensitive     GENTAMICIN <=0.5 SENSITIVE Sensitive     OXACILLIN <=0.25 SENSITIVE Sensitive     TETRACYCLINE <=1 SENSITIVE Sensitive     TRIMETH/SULFA <=10 SENSITIVE Sensitive     CLINDAMYCIN <=0.25 SENSITIVE Sensitive     CEFOXITIN SCREEN NEGATIVE Sensitive     Inducible Clindamycin NEGATIVE Sensitive     * STAPHYLOCOCCUS AUREUS  Culture, blood (routine x 2)     Status: None   Collection Time: 04/09/15  5:03 PM  Result Value Ref Range Status   Specimen Description BLOOD  Final   Special Requests Normal  Final   Culture  Setup Time   Final    GRAM POSITIVE COCCI IN BOTH AEROBIC AND ANAEROBIC BOTTLES CRITICAL RESULT CALLED TO, READ BACK BY AND VERIFIED WITH: BECKY REAP AT  4580 04/10/15. TSH IDENTIFICATION TO FOLLOW CONFIRMED BY MPG    Culture   Final    STAPHYLOCOCCUS AUREUS IN BOTH AEROBIC AND ANAEROBIC BOTTLES    Report Status 04/11/2015 FINAL  Final   Organism ID, Bacteria STAPHYLOCOCCUS AUREUS  Final      Susceptibility   Staphylococcus aureus - MIC*    CIPROFLOXACIN <=0.5 SENSITIVE Sensitive     ERYTHROMYCIN <=0.25 SENSITIVE Sensitive     GENTAMICIN <=0.5 SENSITIVE Sensitive     OXACILLIN <=0.25 SENSITIVE Sensitive     TETRACYCLINE <=1 SENSITIVE Sensitive     TRIMETH/SULFA <=10 SENSITIVE Sensitive     CLINDAMYCIN <=0.25 SENSITIVE Sensitive     CEFOXITIN SCREEN NEGATIVE Sensitive     Inducible Clindamycin NEGATIVE Sensitive     * STAPHYLOCOCCUS AUREUS  Urine culture     Status: None   Collection Time: 04/09/15  6:36 PM  Result Value Ref Range Status   Specimen Description URINE, CLEAN CATCH  Final   Special Requests Normal  Final   Culture NO GROWTH 2 DAYS  Final   Report Status 04/11/2015 FINAL  Final  C difficile quick scan w PCR reflex [facility])     Status: None   Collection Time: 04/10/15  2:14 PM  Result Value Ref Range Status   C Diff antigen POSITIVE  Final   C Diff toxin NEGATIVE  Final   C Diff interpretation   Final    Positive for toxigenic C. difficile, active toxin production not detected. Patient has toxigenic C. difficile organisms present in the bowel, but toxin was not detected. The patient may be a carrier or the level of toxin in the sample was below the limit  of detection. This information should be used in conjunction with the patient's clinical history when deciding on possible therapy.   Clostridium Difficile by PCR     Status: Abnormal   Collection Time: 04/10/15  2:14 PM  Result Value Ref Range Status   C difficile by pcr POSITIVE (A) NEGATIVE Final    Comment: CRITICAL RESULT CALLED TO, READ BACK BY AND VERIFIED WITH: ANNA RODRIGUEZ AT 9983 04/10/15 SDR     Studies/Results: Dg Chest Port 1 View  04/09/2015    CLINICAL DATA:  PICC line placement  EXAM: PORTABLE CHEST - 1 VIEW  COMPARISON:  04/09/2015  FINDINGS: Sternotomy wires overlie normal cardiac silhouette. Interval placement of a right PICC line with tip in the distal SVC. Mild basilar atelectasis.  IMPRESSION: Right PICC line in appropriate position.   Electronically Signed   By: Suzy Bouchard M.D.   On: 04/09/2015 17:17    Echo  5/16 Left ventricle: The cavity size was normal. Systolic function was normal. The estimated ejection fraction was in the range of 60% to 65%. Wall motion was normal; there were no regional wall motion abnormalities. Doppler parameters are consistent with abnormal left ventricular relaxation (grade 1 diastolic dysfunction). - Aortic valve: Poorly visualized. Unable to exclude vegetation. A mechanical prosthesis was present. Transvalvular velocity was increased. There was mild stenosis. - Mitral valve: There was mild to moderate regurgitation. - Left atrium: The atrium was mildly dilated. - Right ventricle: Systolic function was normal. - Right atrium: The atrium was mildly dilated. - Tricuspid valve: There was moderate regurgitation. - Pulmonary arteries: Systolic pressure was moderate to severely elevated. PA peak pressure: 65 mm Hg (S).  Recommendations: Mechanical aortic valve is not well visualized. Consider a TEE to rule our endocarditis if clinically indicated.  Assessment/Plan: 81 yo with mechanical aortic valve admitted with GIB. Has developed Methicillin sensitive Staph aureus bacteremia likely from a L wrist thrombophlebitis. Currently afebrile. He has no evidence that he has seeded his newly placed THR on L or his Aortic valve but will need close monitoring as there is high risk of this. Given the acute infection with a source and rapid treatment hopefully will avoid further seeding.  Also found 5/16 to  have C diff  Rec Change vanco to IV ancef 2 gm q 8 hours Echo TTE negative  -  vegetations usually do not appear acutely so even if negative will need to monitor for endocarditis. Would hold on TEE at this point. Repeat bcx to document clearance - negative to date.   ESR pending Will need 2 week course at least of IV abx  Will need picc changed prior to dc as was placed likely in setting of active bacteremia.  [place], [place]   04/11/2015, 3:03 PM

## 2015-04-11 NOTE — Progress Notes (Signed)
Physical Therapy Treatment Patient Details Name: [name] BergerSydnor Arnold Hegna MRN: [id number] DOB: Apr 20, 1931 Today's Date: 04/11/2015    History of Present Illness pt had L hip replacement 4/26 now back with anemia    PT Comments    Pt with Hg of 7.3 today and MD notes indicate anti-coagulation being held for GI bleed (nursing notes and pt report blood in stools); deferred OOB mobility d/t these concerns.  Pt tolerated bed ex's well.  Will continue to progress pt with functional mobility per pt tolerance and as medically appropriate.  Follow Up Recommendations  SNF     Equipment Recommendations  Rolling walker with 5" wheels    Recommendations for Other Services       Precautions / Restrictions Precautions Precautions: Fall Restrictions Weight Bearing Restrictions: Yes LLE Weight Bearing: Weight bearing as tolerated    Mobility  Bed Mobility                  Transfers                    Ambulation/Gait                 Stairs            Wheelchair Mobility    Modified Rankin (Stroke Patients Only)       Balance                                    Cognition Arousal/Alertness: Awake/alert Behavior During Therapy: WFL for tasks assessed/performed Overall Cognitive Status: Within Functional Limits for tasks assessed                      Exercises   Performed semi-supine B LE therapeutic exercise x 20 reps: Ankle pumps (AROM B LE's); quad sets x3 second holds (AROM B LE's); glute squeezes x3 second holds (AROM B); SAQ's (AROM R; AROM L); heelslides (AROM R; AAROM L), hip abd/adduction (AROM R; AAROM L). Pt required occasional vc's and tactile cues for correct technique with exercises and given intermittent rest breaks.    General Comments  Nursing cleared pt for participation in physical therapy.      Pertinent Vitals/Pain Pain Assessment: No/denies pain  HR 92-104 bpm during session.    Home Living                       Prior Function            PT Goals (current goals can now be found in the care plan section) Acute Rehab PT Goals Patient Stated Goal: To get back to rehab. PT Goal Formulation: With patient Time For Goal Achievement: 04/17/15 Potential to Achieve Goals: Good Progress towards PT goals: PT to reassess next treatment (bed ex's only; will reassess next treatment)    Frequency  7X/week    PT Plan Current plan remains appropriate    Co-evaluation             End of Session   Activity Tolerance: Patient tolerated treatment well Patient left: in bed;with call bell/phone within reach;with bed alarm set;with nursing/sitter in room     Time: 1030-1050 PT Time Calculation (min) (ACUTE ONLY): 20 min  Charges:  $Therapeutic Exercise: 8-22 mins                    G Codes:  Irving Burtonmily Taviana Westergren 04/11/2015, 11:27 AM Hendricks LimesEmily Lauralynn Loeb, PT [phone]

## 2015-04-11 NOTE — Progress Notes (Signed)
Dr Mechele Collinelliott notified of pt requesting more in diet . md reports pt may have 1-2 vanilla yogurt with each meal. Only 1 loose stool this shift after found incontinent . Still receiving iv protonix and sandostatin via picc line. No active bleeding noted. Tolerating flagyl po for cdiff

## 2015-04-11 NOTE — Progress Notes (Signed)
[facility] [place] [facility]   PATIENT NAME: [name]    MR#:  [id number]  DATE OF BIRTH:  [birth year]  SUBJECTIVE: No new complaints.   Review of Systems  Constitutional: Negative for fever, chills and weight loss.  HENT: Negative for nosebleeds and sore throat.   Eyes: Negative for blurred vision.  Respiratory: Negative for cough, shortness of breath and wheezing.   Cardiovascular: Negative for chest pain, orthopnea, leg swelling and PND.  Gastrointestinal: Negative for heartburn, nausea, vomiting, abdominal pain, diarrhea and constipation.  Genitourinary: Negative for dysuria and urgency.  Musculoskeletal: Negative for back pain.  Skin: Negative for rash.  Neurological: Negative for dizziness, speech change, focal weakness and headaches.  Endo/Heme/Allergies: Does not bruise/bleed easily.  Psychiatric/Behavioral: Negative for depression.    VITAL SIGNS: Blood pressure 142/56, pulse 95, temperature 99.5 F (37.5 C), temperature source Oral, resp. rate 18, height 5\' 10"  (1.778 m), weight 86.183 kg (190 lb), SpO2 95 %.  PHYSICAL EXAMINATION:   GENERAL:  81 y.o.-year-old patient lying in the bed with no acute distress. Pale and sleepy, briefly opens his eyes and then drifts back to sleep. Oral mucosa remains somewhat dry. Some dark blood is caked around the lips.  EYES: Pupils equal, round, reactive to light and accommodation. No scleral icterus. Extraocular muscles intact.  HEENT: Head atraumatic, normocephalic. Oropharynx and nasopharynx clear.  NECK:  Supple, no jugular venous distention. No thyroid enlargement, no tenderness.  LUNGS: Normal breath sounds bilaterally, no wheezing, rales,rhonchi or crepitation. No use of accessory muscles of respiration.  CARDIOVASCULAR: S1, S2 normal. No murmurs, rubs, or gallops.  ABDOMEN: Soft, nontender, nondistended. Bowel sounds present. No organomegaly or mass.  EXTREMITIES: No pedal edema, cyanosis, or  clubbing.  NEUROLOGIC: Cranial nerves II through XII are intact. Muscle strength 5/5 in all extremities. Sensation intact. Gait not checked.  PSYCHIATRIC: The patient is alert and oriented x 3.  SKIN: No obvious rash, lesion, or ulcer.  Upper extremity edema was noted, however, no significant edema in lower extremities or crepitations in he lungs were ausculted.  ORDERS/RESULTS REVIEWED:   CBC  Recent Labs Lab 04/07/15 2240 04/08/15 0654 04/08/15 1659 04/09/15 0349 04/09/15 1703 04/10/15 0430 04/11/15 0636  WBC 14.0* 16.9* 13.5* 16.5*  --   --  4.4  HGB 6.9*  6.9* 7.5* 7.5* 7.7* 8.0* 6.6* 7.3*  HCT 21.3* 23.4* 23.0* 22.7*  --   --  22.1*  PLT 634* 514* 360 387  --   --  210  MCV 88.3 89.4 89.1 89.0  --   --  90.8  MCH 28.8 28.7 29.2 30.1  --   --  30.1  MCHC 32.6 32.1 32.8 33.8  --   --  33.1  RDW 20.1* 18.3* 17.1* 17.8*  --   --  16.3*   ------------------------------------------------------------------------------------------------------------------  Chemistries   Recent Labs Lab 04/05/15 0432 04/06/15 0411 04/10/15 1405  NA 130* 129*  --   K 3.9 3.9  --   CL 96* 97*  --   CO2 26 27  --   GLUCOSE 98 118*  --   BUN 15 10  --   CREATININE 0.72 0.70 0.62  CALCIUM 8.4* 8.2*  --   MG 2.2  --   --    ------------------------------------------------------------------------------------------------------------------ estimated creatinine clearance is 74.8 mL/min (by C-G formula based on Cr of 0.62).  --------------------------------------------------------------------------------------------------------------- Cultures are growing of 4 out of 4. Staphylococcus aureus. Sensitivities are pending.  RADI[facility]: [facility]  04/09/2015   CLINICAL DATA:  PICC line placement  EXAM: PORTABLE CHEST - 1 VIEW  COMPARISON:  04/09/2015  FINDINGS: Sternotomy wires overlie normal cardiac silhouette. Interval placement of a right PICC line with tip in the distal SVC. Mild basilar  atelectasis.  IMPRESSION: Right PICC line in appropriate position.   Electronically Signed   By: Genevive BiStewart  Edmunds M.D.   On: 04/09/2015 17:17    ASSESSMENT AND PLAN:  [name] is a 81 y.o. male with a known history of hypertension, restless leg syndrome, mechanical aortic valve replacement surgery, osteoarthritis requiring bilateral hip replacement surgeries and a recent left total hip replacement surgery on 03/21/2015. [facility] and was brought in secondary to weakness and noted to be anemic  * Staphylococcus aureus bacteremia of unclear origin: 2-D echo negative, we'll hold off TEE at this time as it will likely be inconclusive Per ID - Change vanco to IV ancef 2 gm q 8 hours Repeat bcx to document clearance - negative to date.  Will need 2 week course at least of IV abx  Will need picc changed prior to dc as was placed likely in setting of active bacteremia.  * Acute posthemorrhagic on chronic GIB loss  Anemia: Appreciate Dr. Earnest ConroyElliott's input - I agree. We will hold anticoagulation for at least 2 weeks. consider getting upper and lower endoscopy at that point per GI, he is certainly at risk, but at this point benefit outweighs its risk.  * Hypotension with h/o essential HTN-blood pressure now is low due to bleeding, may resume his valsartan and Norvasc. Continue to monitor.   * Mechanical aortic valve replacement- goal INR 2-3 , but with his GI bleed and anemia, holding Coumadin at this time. Can consider low-dose heparin. Discussed with Dr. Mariah MillingGollan at length. We'll hold off anticoagulation based on discussion with GI.  * leukocytosis: Likely due to bacteremia  * Coagulopathy due to coumadin, hold coumadin, following clinically  * C. difficile Diarrhea: On oral Flagyl.  Management plans discussed with the patient, family- daughter, DR Conrad BurlingtonFitzgerald, Gollan  and they are in agreement.   DRUG ALLERGIES:  Allergies  Allergen Reactions  . Sulfa  Antibiotics     unknown    CODE STATUS:     Code Status Orders        Start     Ordered   04/02/15 2123  Full code   Continuous     04/02/15 2122    Advance Directive Documentation        Most Recent Value   Type of Advance Directive  Healthcare Power of Attorney, Living will   Pre-existing out of facility DNR order (yellow form or pink MOST form)     "MOST" Form in Place?        TOTAL TIME TAKING CARE OF THIS PATIENT: 35 minutes.   Extensive discussion this patient's daughter, patient's condition, treatment plan, evaluation plan as well as follow-up.  [facility], Robinette Esters M.D on 04/11/2015 at 3:54 PM  Between 7am to 6pm - Pager - [phone]  After 6pm go to [email or web address] - password [email or web address] [facility]  [facility]  Office  [phone]  CC: Primary care physician; [name], [name], MD

## 2015-04-11 NOTE — Progress Notes (Signed)
Patient: Vito BergerSydnor Arnold Ivan / Admit Date: 04/02/2015 / Date of Encounter: 04/11/2015, 9:11 AM   Subjective: No complaints this AM, eating clear liquid diet. Warfarin held for GI bleed, Gi following. No plan at this time for colo. HBG of 7  Review of Systems: Review of Systems  HENT: Negative.   Eyes: Negative.   Respiratory: Negative.   Gastrointestinal: Negative.   Genitourinary: Negative.   Musculoskeletal: Negative.   Skin: Negative.   Neurological: Positive for weakness.  Psychiatric/Behavioral: Negative.   All other systems reviewed and are negative.   Objective: Telemetry: not on telemetry  Physical Exam: Blood pressure 142/56, pulse 95, temperature 99.5 F (37.5 C), temperature source Oral, resp. rate 18, height 5\' 10"  (1.778 m), weight 86.183 kg (190 lb), SpO2 95 %. Body mass index is 27.26 kg/(m^2). General: Well developed, well nourished, in no acute distress. Head: Normocephalic, atraumatic, sclera non-icteric, no xanthomas, nares are without discharge. Neck: Negative for carotid bruits. JVP not elevated. Lungs: Clear bilaterally to auscultation without wheezes, rales, or rhonchi. Breathing is unlabored. Heart: RRR S1 S2 without 2/6 harsh systolic systolic murmur with click. No rubs, or gallops.  Abdomen: Soft, non-tender, non-distended with normoactive bowel sounds. No rebound/guarding. Extremities: No clubbing or cyanosis. No edema. Distal pedal pulses are 2+ and equal bilaterally. Neuro: Alert and oriented X 3. Moves all extremities spontaneously. Psych:  Responds to questions appropriately with a normal affect.   Intake/Output Summary (Last 24 hours) at 04/11/15 0911 Last data filed at 04/11/15 0849  Gross per 24 hour  Intake 2472.17 ml  Output    351 ml  Net 2121.17 ml    Inpatient Medications:  . sodium chloride   Intravenous Once  . amLODipine  10 mg Oral Daily  . citalopram  20 mg Oral Daily  . feeding supplement (ENSURE ENLIVE)  237 mL Oral  BID BM  . feeding supplement (RESOURCE BREEZE)  1 Container Oral TID WC  . finasteride  5 mg Oral Daily  . irbesartan  75 mg Oral Daily  . lactulose  20 g Oral BID  . metroNIDAZOLE  500 mg Oral 3 times per day  . multivitamin  1 tablet Oral Daily  . rOPINIRole  1 mg Oral TID  . sodium chloride  3 mL Intravenous Q12H  . sodium phosphate  1 enema Rectal Once  . sucralfate  1 g Oral TID WC & HS  . tamsulosin  0.4 mg Oral Daily  . traZODone  50 mg Oral QHS  . vancomycin  1,250 mg Intravenous Once  . vancomycin  1,250 mg Intravenous Q12H   Infusions:  . octreotide  (SANDOSTATIN)    IV infusion 50 mcg/hr (04/10/15 2319)  . pantoprozole (PROTONIX) infusion 8 mg/hr (04/10/15 2319)    Labs:  Recent Labs  04/10/15 1405  CREATININE 0.62   No results for input(s): AST, ALT, ALKPHOS, BILITOT, PROT, ALBUMIN in the last 72 hours.  Recent Labs  04/09/15 0349  04/10/15 0430 04/11/15 0636  WBC 16.5*  --   --  4.4  HGB 7.7*  < > 6.6* 7.3*  HCT 22.7*  --   --  22.1*  MCV 89.0  --   --  90.8  PLT 387  --   --  210  < > = values in this interval not displayed. No results for input(s): CKTOTAL, CKMB, TROPONINI in the last 72 hours. Invalid input(s): POCBNP No results for input(s): HGBA1C in the last 72 hours.   Weights:  Filed Weights   04/02/15 1618  Weight: 86.183 kg (190 lb)     Radiology/Studies:  Dg Chest 2 View  04/09/2015   CLINICAL DATA:  81 year old male with fever. History of cardiac valve replacement.  EXAM: CHEST  2 VIEW  COMPARISON:  10/30/2014.  FINDINGS: Cardiomegaly cardiac valve replacement changes noted.  Patchy opacity at the medial right lung base may represent airspace disease or atelectasis.  There is no evidence of pleural effusion,, pulmonary edema, pneumothorax or pulmonary mass.  No acute bony abnormalities are noted.  IMPRESSION: Patchy medial right lung base opacity -question atelectasis versus airspace disease/ pneumonia.  Cardiomegaly.   Electronically  Signed   By: Harmon PierJeffrey  Hu M.D.   On: 04/09/2015 16:49      Assessment and Plan  81 year old male with h/o aortic stenosis s/p St. Jude mechanical aortic valve replacement in 2002 on chronic Coumadin therapy, mild CAD by cardiac cath in 2011 managed medically, GERD, HTN, carotid bruits, history of asymptomatic bradycardia, RLS, anxiety, depression, and osteoarthritis s/p total left hip replacement on 03/21/2015 who [facility] rehab 2 weeks ago 2/2 the above surgery with a hgb of 8 (baseline 12), he was started on po iron replacement therapy, who presented to on 5/8 with acute onset of weakness, melena, BRBPR, and was found to have a hgb of 5.1.   1. Acute on chronic anemia: Likely in the setting of upper/lower GI bleed given melena, heme positive stool S/p EGD Last colonoscopy at age 81 years   ---Will need to continue to hold warfarin. Perhaps next week could try heparin with no bolus to test.  High risk period off anticoagulation though given rebleed on warfarin, there is little choice. Other options also discussed with Dr. Sherryll BurgerShah, These could include performing a colo for evaluation, getting vascular involved (unclear if embolization is an option, if bleeding scan can isolate bleed),  Patient has indicated he does not want repeat valve surgery for placement of bioprosthetic valve  2. History of mechanical AVR: -Coumadin on hold as above  echo  demonstrated valve was functioning well No clear vegetation, though not well visualized. If clinically indicated, TEE could be performed. Even on TEE, AVR is challenging to see given artifact  3. Nonobstructive CAD by cardiac cath 2011: -No symptoms of angina  4. Bacteremia: On ABX, staph Consider a TEE if clinically indicated (will defer to ID).  5. C. Diff Management per medical service  6. HTN: -Well controlled -Continue current medication  7. Status post left hip replacement: -Continue PT  8. GERD: -IV  Protonix    Signed, Dossie Arbourim Mariaisabel Bodiford, M.D., Ph.D. 04/11/2015 9:11 AM

## 2015-04-12 ENCOUNTER — Inpatient Hospital Stay: Payer: Medicare Other

## 2015-04-12 LAB — BASIC METABOLIC PANEL
Anion gap: 4 — ABNORMAL LOW (ref 5–15)
BUN: 12 mg/dL (ref 6–20)
CO2: 27 mmol/L (ref 22–32)
CREATININE: 0.61 mg/dL (ref 0.61–1.24)
Calcium: 7.2 mg/dL — ABNORMAL LOW (ref 8.9–10.3)
Chloride: 98 mmol/L — ABNORMAL LOW (ref 101–111)
Glucose, Bld: 117 mg/dL — ABNORMAL HIGH (ref 65–99)
POTASSIUM: 2.9 mmol/L — AB (ref 3.5–5.1)
Sodium: 129 mmol/L — ABNORMAL LOW (ref 135–145)

## 2015-04-12 LAB — CBC
HCT: 22.8 % — ABNORMAL LOW (ref 40.0–52.0)
Hemoglobin: 7.5 g/dL — ABNORMAL LOW (ref 13.0–18.0)
MCH: 29.5 pg (ref 26.0–34.0)
MCHC: 32.7 g/dL (ref 32.0–36.0)
MCV: 90.1 fL (ref 80.0–100.0)
PLATELETS: 231 10*3/uL (ref 150–440)
RBC: 2.53 MIL/uL — ABNORMAL LOW (ref 4.40–5.90)
RDW: 15.8 % — AB (ref 11.5–14.5)
WBC: 5.7 10*3/uL (ref 3.8–10.6)

## 2015-04-12 LAB — TYPE AND SCREEN
ABO/RH(D): O POS
ANTIBODY SCREEN: NEGATIVE
UNIT DIVISION: 0
UNIT DIVISION: 0
UNIT DIVISION: 0
UNIT DIVISION: 0

## 2015-04-12 LAB — PROTIME-INR
INR: 1.09
Prothrombin Time: 14.3 seconds (ref 11.4–15.0)

## 2015-04-12 MED ORDER — POTASSIUM CHLORIDE CRYS ER 20 MEQ PO TBCR
40.0000 meq | EXTENDED_RELEASE_TABLET | Freq: Once | ORAL | Status: AC
  Administered 2015-04-12: 40 meq via ORAL
  Filled 2015-04-12: qty 2

## 2015-04-12 MED ORDER — VANCOMYCIN 50 MG/ML ORAL SOLUTION
250.0000 mg | Freq: Four times a day (QID) | ORAL | Status: DC
  Administered 2015-04-12 – 2015-04-13 (×5): 250 mg via ORAL
  Filled 2015-04-12 (×8): qty 5

## 2015-04-12 MED ORDER — POTASSIUM CHLORIDE CRYS ER 20 MEQ PO TBCR
20.0000 meq | EXTENDED_RELEASE_TABLET | Freq: Once | ORAL | Status: AC
  Administered 2015-04-12: 20 meq via ORAL
  Filled 2015-04-12: qty 1

## 2015-04-12 NOTE — Progress Notes (Signed)
ID: [name] is a 82 y.o. male with  Staph bacteremia, C diff  Principal Problem:   Anemia Active Problems:   Blood loss anemia   Bleeding gastrointestinal   S/P AVR (aortic valve replacement)  Subjective: Remains afebrile. Is off coumadin, no recurrent bleeding.   Medications:  . sodium chloride   Intravenous Once  . amLODipine  10 mg Oral Daily  .  ceFAZolin (ANCEF) IV  2 g Intravenous 3 times per day  . citalopram  20 mg Oral Daily  . finasteride  5 mg Oral Daily  . irbesartan  75 mg Oral Daily  . multivitamin with minerals  1 tablet Oral Daily  . rOPINIRole  1 mg Oral TID  . sodium chloride  10 mL Intravenous Q12H  . sodium chloride  3 mL Intravenous Q12H  . sucralfate  1 g Oral TID WC & HS  . tamsulosin  0.4 mg Oral Daily  . traZODone  50 mg Oral QHS  . vancomycin  250 mg Oral 4 times per day    Objective: Vital signs in last 24 hours: Temp:  [97.5 F (36.4 C)-98.7 F (37.1 C)] 97.5 F (36.4 C) (05/18 0837) Pulse Rate:  [85-97] 86 (05/18 0837) Resp:  [18-20] 20 (05/18 0837) BP: (94-138)/(55-77) 118/62 mmHg (05/18 1008) SpO2:  [95 %-100 %] 95 % (05/18 0837)  GENERAL:lying in the bed with no acute distress. Pale appearing EYES: Pupils equal, round, reactive to light and accommodation. No scleral icterus. Extraocular muscles intact.  HEENT: Head atraumatic, normocephalic. Oropharynx and nasopharynx clear.  NECK: Supple, no jugular venous distention. No thyroid enlargement, no tenderness.  LUNGS: Normal breath sounds bilaterally, no wheezing, rales,rhonchi or crepitation. No use of accessory muscles of respiration.  CARDIOVASCULAR: S1, S2 normal. Av mech sound.  ABDOMEN: Soft, nontender, nondistended. Bowel sounds present. No organomegaly or mass.  EXTREMITIES: No pedal edema, cyanosis, or clubbing. Left hip pain on flexing. No focal erythema - site well healed  NEUROLOGIC: Cranial nerves II through XII are intact. Muscle strength 5/5 in all  extremities. Sensation intact. Gait not checked.  PSYCHIATRIC: The patient is alert and oriented x 3.  SKIN: L wrist dorsum with area of induration and erythema at old IV site  Lab Results  Recent Labs  04/10/15 1405 04/11/15 0636 04/12/15 0528  WBC  --  4.4 5.7  HGB  --  7.3* 7.5*  HCT  --  22.1* 22.8*  NA  --   --  129*  K  --   --  2.9*  CL  --   --  98*  CO2  --   --  27  BUN  --   --  12  CREATININE 0.62  --  0.61    Microbiology: Results for orders placed or performed during the hospital encounter of 04/02/15  Culture, blood (routine x 2)     Status: None   Collection Time: 04/09/15  5:03 PM  Result Value Ref Range Status   Specimen Description BLOOD  Final   Special Requests Normal  Final   Culture  Setup Time   Final    GRAM POSITIVE COCCI IN BOTH AEROBIC AND ANAEROBIC BOTTLES CRITICAL RESULT CALLED TO, READ BACK BY AND VERIFIED WITH: BECKU REAP AT 7672 04/10/15. TSH IDENTIFICATION TO FOLLOW CONFIRMED BY MPG    Culture   Final    STAPHYLOCOCCUS AUREUS IN BOTH AEROBIC AND ANAEROBIC BOTTLES    Report Status 04/11/2015 FINAL  Final   Organism ID,  Bacteria STAPHYLOCOCCUS AUREUS  Final      Susceptibility   Staphylococcus aureus - MIC*    CIPROFLOXACIN <=0.5 SENSITIVE Sensitive     ERYTHROMYCIN <=0.25 SENSITIVE Sensitive     GENTAMICIN <=0.5 SENSITIVE Sensitive     OXACILLIN <=0.25 SENSITIVE Sensitive     TETRACYCLINE <=1 SENSITIVE Sensitive     TRIMETH/SULFA <=10 SENSITIVE Sensitive     CLINDAMYCIN <=0.25 SENSITIVE Sensitive     CEFOXITIN SCREEN NEGATIVE Sensitive     Inducible Clindamycin NEGATIVE Sensitive     * STAPHYLOCOCCUS AUREUS  Culture, blood (routine x 2)     Status: None   Collection Time: 04/09/15  5:03 PM  Result Value Ref Range Status   Specimen Description BLOOD  Final   Special Requests Normal  Final   Culture  Setup Time   Final    GRAM POSITIVE COCCI IN BOTH AEROBIC AND ANAEROBIC BOTTLES CRITICAL RESULT CALLED TO, READ BACK BY AND  VERIFIED WITH: BECKY REAP AT 9449 04/10/15. TSH IDENTIFICATION TO FOLLOW CONFIRMED BY MPG    Culture   Final    STAPHYLOCOCCUS AUREUS IN BOTH AEROBIC AND ANAEROBIC BOTTLES    Report Status 04/11/2015 FINAL  Final   Organism ID, Bacteria STAPHYLOCOCCUS AUREUS  Final      Susceptibility   Staphylococcus aureus - MIC*    CIPROFLOXACIN <=0.5 SENSITIVE Sensitive     ERYTHROMYCIN <=0.25 SENSITIVE Sensitive     GENTAMICIN <=0.5 SENSITIVE Sensitive     OXACILLIN <=0.25 SENSITIVE Sensitive     TETRACYCLINE <=1 SENSITIVE Sensitive     TRIMETH/SULFA <=10 SENSITIVE Sensitive     CLINDAMYCIN <=0.25 SENSITIVE Sensitive     CEFOXITIN SCREEN NEGATIVE Sensitive     Inducible Clindamycin NEGATIVE Sensitive     * STAPHYLOCOCCUS AUREUS  Urine culture     Status: None   Collection Time: 04/09/15  6:36 PM  Result Value Ref Range Status   Specimen Description URINE, CLEAN CATCH  Final   Special Requests Normal  Final   Culture NO GROWTH 2 DAYS  Final   Report Status 04/11/2015 FINAL  Final  C difficile quick scan w PCR reflex [facility])     Status: None   Collection Time: 04/10/15  2:14 PM  Result Value Ref Range Status   C Diff antigen POSITIVE  Final   C Diff toxin NEGATIVE  Final   C Diff interpretation   Final    Positive for toxigenic C. difficile, active toxin production not detected. Patient has toxigenic C. difficile organisms present in the bowel, but toxin was not detected. The patient may be a carrier or the level of toxin in the sample was below the limit  of detection. This information should be used in conjunction with the patient's clinical history when deciding on possible therapy.   Clostridium Difficile by PCR     Status: Abnormal   Collection Time: 04/10/15  2:14 PM  Result Value Ref Range Status   C difficile by pcr POSITIVE (A) NEGATIVE Final    Comment: CRITICAL RESULT CALLED TO, READ BACK BY AND VERIFIED WITH: ANNA RODRIGUEZ AT 6759 04/10/15 SDR   Culture, blood (routine x 2)      Status: None (Preliminary result)   Collection Time: 04/11/15  4:14 PM  Result Value Ref Range Status   Specimen Description BLOOD  Final   Special Requests Normal  Final   Culture NO GROWTH < 24 HOURS  Final   Report Status PENDING  Incomplete  Culture, blood (  routine x 2)     Status: None (Preliminary result)   Collection Time: 04/11/15  4:14 PM  Result Value Ref Range Status   Specimen Description BLOOD  Final   Special Requests Normal  Final   Culture NO GROWTH < 24 HOURS  Final   Report Status PENDING  Incomplete    Studies/Results: Dg Chest 2 View  04/11/2015   CLINICAL DATA:  Increase shortness of Breath  EXAM: CHEST  2 VIEW  COMPARISON:  04/09/2015  FINDINGS: Right-sided PICC line is again identified. Postsurgical changes are again seen. The cardiac shadow is stable but mildly enlarged. No focal infiltrate or sizable effusion is seen. Mild vascular congestion is noted without edema.  IMPRESSION: Mild vascular congestion without edema. No other focal abnormality is seen.   Electronically Signed   By: Inez Catalina M.D.   On: 04/11/2015 15:52   Lab Results  Component Value Date   ESRSEDRATE 37* 04/11/2015    Echo 5/16 Left ventricle: The cavity size was normal. Systolic function was normal. The estimated ejection fraction was in the range of 60% to 65%. Wall motion was normal; there were no regional wall motion abnormalities. Doppler parameters are consistent with abnormal left ventricular relaxation (grade 1 diastolic dysfunction). - Aortic valve: Poorly visualized. Unable to exclude vegetation. A mechanical prosthesis was present. Transvalvular velocity was increased. There was mild stenosis. - Mitral valve: There was mild to moderate regurgitation. - Left atrium: The atrium was mildly dilated. - Right ventricle: Systolic function was normal. - Right atrium: The atrium was mildly dilated. - Tricuspid valve: There was moderate regurgitation. - Pulmonary  arteries: Systolic pressure was moderate to severely elevated. PA peak pressure: 65 mm Hg (S).  Recommendations: Mechanical aortic valve is not well visualized. Consider a TEE to rule our endocarditis if clinically indicated.  Assessment/Plan: 81 yo with mechanical aortic valve admitted with GIB. Has developed Methicillin sensitive Staph aureus bacteremia likely from a L wrist thrombophlebitis. Currently afebrile. He has no evidence that he has seeded his newly placed THR on L or his Aortic valve but will need close monitoring as there is high risk of this. Given the acute infection with a source and rapid treatment hopefully will avoid further seeding.  Also found 5/16 to  have C diff  Rec SAB -  Continue  ancef 2 gm q 8 hours    Echo (TTE) negative -  vegetations usually do not appear acutely so even if negative will need to monitor for   endocarditis. Would hold on TEE at this point.  Repeat bcx - negative to date from 5/17    ESR 37   After discussion with daughter, and patient and weighing high risk of endocarditis will recommend a 6 week course   of IV ancef from date of first negative culture   - then will need survelliance cultures once abx stopped to document clearance   Will need picc changed prior to dc as was placed likely in setting of active bacteremia -if cx from 5.17 remains  negative tomorrow can change Picc to new site and remove old one.  For LUE edema and redness at site of prior IV - check Venous doppler and soft tissue US to eval for absces that may need drainage - rec elevate and ice arm  C diff- cont oral vanco - consider full 6 weeks + 10 days after stopping ancef but will dw Dr Tedra Coupe I will need to see in clinic in 2-3 weeks  [place], Jardine   04/12/2015, 2:37 PM

## 2015-04-12 NOTE — Progress Notes (Signed)
Discussed with patient's daughter Cala BradfordKimberly and gone over D/C plans and outpt f/up GI, ID and Cardio. She understands it fully and is in agreement. I've also discussed management plans with Dr Sampson GoonFitzgerald, Dr Markham JordanElliot and Dr Mariah MillingGollan who are all on agreement.

## 2015-04-12 NOTE — Progress Notes (Signed)
Nutrition Follow-up  DOCUMENTATION CODES:     INTERVENTION:   (Meals and Snacks: Cater to patient preferences; Medical Nutrition Supplement: assess need for additional supplementation on follow)  NUTRITION DIAGNOSIS:  Inadequate oral intake related to acute illness as evidenced by  (clear liquid/ NPO diet x 8 days).  Currently on soft diet x 1 meal.  GOAL:  Patient will meet greater than or equal to 90% of their needs   MONITOR:   (Energy Intake, Diet advancement, Electrolyte and Renal Profile, Anthropometrics, Skin)  REASON FOR ASSESSMENT:   (RD Follow Up)    ASSESSMENT: Clinical Update: Pt advanced to soft diet; does not like Ensure or Boost BReeze  Typical Food/ Fluid Intake: 50% of lunch (soft diet) eaten at bedside  Weight Changes: Reviewed recent medical records, patient weight x 1 month ago- 180#; x 5 months ago- 204.5#. Weight fluctuations but no significant changes.  Labs:  Electrolyte and Renal Profile:    Recent Labs Lab 04/06/15 0411 04/10/15 1405 04/12/15 0528  BUN 10  --  12  CREATININE 0.70 0.62 0.61  NA 129*  --  129*  K 3.9  --  2.9*   Meds: MVI Physical Findings: n/a  Height:  Ht Readings from Last 1 Encounters:  04/02/15 5\' 10"  (1.778 m)    Weight:  Wt Readings from Last 1 Encounters:  04/02/15 190 lb (86.183 kg)    Ideal Body Weight:     Wt Readings from Last 10 Encounters:  04/02/15 190 lb (86.183 kg)  03/24/15 179 lb 14.3 oz (81.6 kg)  09/30/14 195 lb (88.451 kg)  11/05/13 196 lb (88.905 kg)  04/05/13 183 lb (83.008 kg)  11/05/12 183 lb (83.008 kg)  03/25/12 191 lb 12.8 oz (87 kg)  02/11/11 195 lb (88.451 kg)  10/01/10 192 lb (87.091 kg)  08/30/10 192 lb (87.091 kg)    BMI:  Body mass index is 27.26 kg/(m^2).   Skin:  Reviewed, no issues  Diet Order:  DIET SOFT Room service appropriate?: Yes; Fluid consistency:: Thin  EDUCATION NEEDS:  No education needs identified at this time   Intake/Output Summary  (Last 24 hours) at 04/12/15 1513 Last data filed at 04/12/15 1344  Gross per 24 hour  Intake   1310 ml  Output   1055 ml  Net    255 ml    Last BM:  04/11/15   Joeseph Amorracey L. Gaines, RDN Pager: [phone] Office: [phone]  LOW Care Level

## 2015-04-12 NOTE — Progress Notes (Signed)
Physical Therapy Treatment Patient Details Name: [name] MRN: [id number] DOB: 03-17-1931 Today's Date: 04/12/2015    History of Present Illness pt had L hip replacement 4/26 now back with anemia    PT Comments    [name] potassium 2.9 (po replacement today already) and Hg up to 7.5 today.  Pt able to progress to short ambulation distances in room (HR 92-112 bpm with activity) with RW and 1 assist; pt given rest breaks between ambulation trials.  Pt reporting the short ambulation distances in room were moderate in intensity but appeared to tolerate well.  Will continue to progress pt per pt tolerance.  Follow Up Recommendations  SNF     Equipment Recommendations  Rolling walker with 5" wheels    Recommendations for Other Services       Precautions / Restrictions Precautions Precautions: Fall Restrictions Weight Bearing Restrictions: Yes LLE Weight Bearing: Weight bearing as tolerated    Mobility  Bed Mobility Overal bed mobility: Needs Assistance Bed Mobility: Supine to Sit     Supine to sit: Supervision;HOB elevated     General bed mobility comments: extra time to perform on own  Transfers Overall transfer level: Needs assistance Equipment used: Rolling walker (2 wheeled) Transfers: Sit to/from Stand Sit to Stand: Min guard;Min assist         General transfer comment: Limited L knee mobility effecting use during transition to standing.  Ambulation/Gait Ambulation/Gait assistance: Min guard Ambulation Distance (Feet):  (12 feet x3 (pt reporting effort moderate for this distance)) Assistive device: Rolling walker (2 wheeled)       General Gait Details: decreased cadence; decreased B step length and foot clearance and heelstrike   Stairs            Wheelchair Mobility    Modified Rankin (Stroke Patients Only)       Balance                                    Cognition Arousal/Alertness: Awake/alert Behavior During  Therapy: WFL for tasks assessed/performed Overall Cognitive Status: Within Functional Limits for tasks assessed                      Exercises   Performed semi-supine B LE therapeutic exercise x 10 reps:  Ankle pumps (AROM B LE's); quad sets x3 second holds (AROM B LE's); glute squeezes x3 second holds (AROM B); SAQ's (AROM R; AROM L); heelslides (AROM R; AAROM L), hip abd/adduction (AROM R; AROM L).  Pt required vc's and tactile cues for correct technique with exercises.     General Comments  Nursing cleared pt for participation in physical therapy.   [name] daughter present during session.      Pertinent Vitals/Pain Pain Assessment: No/denies pain    Home Living                      Prior Function            PT Goals (current goals can now be found in the care plan section) Acute Rehab PT Goals Patient Stated Goal: To get back to rehab. PT Goal Formulation: With patient Time For Goal Achievement: 04/17/15 Potential to Achieve Goals: Good Progress towards PT goals: Progressing toward goals    Frequency  7X/week    PT Plan Current plan remains appropriate    Co-evaluation  End of Session Equipment Utilized During Treatment: Gait belt Activity Tolerance: Patient tolerated treatment well Patient left: in chair;with call bell/phone within reach;with chair alarm set;with family/visitor present     Time: [id number] PT Time Calculation (min) (ACUTE ONLY): 27 min  Charges:  $Gait Training: 8-22 mins $Therapeutic Exercise: 8-22 mins                    G CodesHendricks Limes:      Miryah Ralls 04/12/2015, 3:38 PM Hendricks LimesEmily Naeema Patlan, PT [phone]

## 2015-04-12 NOTE — Progress Notes (Signed)
HGB 7.5, K 2.9 with po potassium replacement. C Diff on IV antibiotics per infectious disease. It is anticipated pt will [facility] when medically stable.

## 2015-04-12 NOTE — Progress Notes (Addendum)
[facility] [place] [facility]   PATIENT NAME: [name] Hippler    MR#:  [id number]  DATE OF BIRTH:  11/30/1930  SUBJECTIVE: Same.  Review of Systems  Constitutional: Negative for fever, chills and weight loss.  HENT: Negative for nosebleeds and sore throat.   Eyes: Negative for blurred vision.  Respiratory: Negative for cough, shortness of breath and wheezing.   Cardiovascular: Negative for chest pain, orthopnea, leg swelling and PND.  Gastrointestinal: Negative for heartburn, nausea, vomiting, abdominal pain, diarrhea and constipation.  Genitourinary: Negative for dysuria and urgency.  Musculoskeletal: Negative for back pain.  Skin: Negative for rash.  Neurological: Positive for weakness. Negative for dizziness, speech change, focal weakness and headaches.  Endo/Heme/Allergies: Does not bruise/bleed easily.  Psychiatric/Behavioral: Negative for depression.    VITAL SIGNS: Blood pressure 118/62, pulse 86, temperature 97.5 F (36.4 C), temperature source Oral, resp. rate 20, height 5\' 10"  (1.778 m), weight 86.183 kg (190 lb), SpO2 95 %.  PHYSICAL EXAMINATION:   GENERAL:  81 y.o.-year-old patient lying in the bed with no acute distress. Pale and sleepy, briefly opens his eyes and then drifts back to sleep. Oral mucosa remains somewhat dry. Some dark blood is caked around the lips.  EYES: Pupils equal, round, reactive to light and accommodation. No scleral icterus. Extraocular muscles intact.  HEENT: Head atraumatic, normocephalic. Oropharynx and nasopharynx clear.  NECK:  Supple, no jugular venous distention. No thyroid enlargement, no tenderness.  LUNGS: Normal breath sounds bilaterally, no wheezing, rales,rhonchi or crepitation. No use of accessory muscles of respiration.  CARDIOVASCULAR: S1, S2 normal. No murmurs, rubs, or gallops.  ABDOMEN: Soft, nontender, nondistended. Bowel sounds present. No organomegaly or mass.  EXTREMITIES: No pedal edema,  cyanosis, or clubbing.  NEUROLOGIC: Cranial nerves II through XII are intact. Muscle strength 5/5 in all extremities. Sensation intact. Gait not checked.  PSYCHIATRIC: The patient is alert and oriented x 3.  SKIN: No obvious rash, lesion, or ulcer.  Upper extremity edema was noted, however, no significant edema in lower extremities or crepitations in he lungs were ausculted.  ORDERS/RESULTS REVIEWED:   CBC  Recent Labs Lab 04/08/15 0654 04/08/15 1659 04/09/15 0349 04/09/15 1703 04/10/15 0430 04/11/15 0636 04/12/15 0528  WBC 16.9* 13.5* 16.5*  --   --  4.4 5.7  HGB 7.5* 7.5* 7.7* 8.0* 6.6* 7.3* 7.5*  HCT 23.4* 23.0* 22.7*  --   --  22.1* 22.8*  PLT 514* 360 387  --   --  210 231  MCV 89.4 89.1 89.0  --   --  90.8 90.1  MCH 28.7 29.2 30.1  --   --  30.1 29.5  MCHC 32.1 32.8 33.8  --   --  33.1 32.7  RDW 18.3* 17.1* 17.8*  --   --  16.3* 15.8*   ------------------------------------------------------------------------------------------------------------------  Chemistries   Recent Labs Lab 04/06/15 0411 04/10/15 1405 04/12/15 0528  NA 129*  --  129*  K 3.9  --  2.9*  CL 97*  --  98*  CO2 27  --  27  GLUCOSE 118*  --  117*  BUN 10  --  12  CREATININE 0.70 0.62 0.61  CALCIUM 8.2*  --  7.2*   ------------------------------------------------------------------------------------------------------------------ estimated creatinine clearance is 74.8 mL/min (by C-G formula based on Cr of 0.61).  --------------------------------------------------------------------------------------------------------------- Cultures are growing of 4 out of 4. Staphylococcus aureus. Sensitivities are pending.  RADIOLOGY: Dg Chest 2 View  04/11/2015   CLINICAL DATA:  Increase shortness of  Breath  EXAM: CHEST  2 VIEW  COMPARISON:  04/09/2015  FINDINGS: Right-sided PICC line is again identified. Postsurgical changes are again seen. The cardiac shadow is stable but mildly enlarged. No focal infiltrate  or sizable effusion is seen. Mild vascular congestion is noted without edema.  IMPRESSION: Mild vascular congestion without edema. No other focal abnormality is seen.   Electronically Signed   By: Alcide CleverMark  Lukens M.D.   On: 04/11/2015 15:52    ASSESSMENT AND PLAN:  [name] Weckerly is a 81 y.o. male with a known history of hypertension, restless leg syndrome, mechanical aortic valve replacement surgery, osteoarthritis requiring bilateral hip replacement surgeries and a recent left total hip replacement surgery on 03/21/2015. [facility] and was brought in secondary to weakness and noted to be anemic  * Staphylococcus aureus bacteremia of unclear origin: 2-D echo negative, we'll hold off TEE at this time as it will likely be inconclusive Per ID - Change vanco to IV ancef 2 gm q 8 hours Repeat bcx to document clearance - negative to date.  Will need 2 week course at least of IV abx  Will need picc changed prior to dc as was placed likely in setting of active bacteremia. - will discuss with ID   * Acute posthemorrhagic on chronic GIB loss  Anemia: Appreciate Dr. Earnest ConroyElliott's input - I agree. We will hold anticoagulation for at least 2 weeks. consider getting upper and lower endoscopy at that point per GI, he is certainly at risk, but at this point benefit outweighs its risk.  * Hypotension with h/o essential HTN-blood pressure now is low due to bleeding, may resume his valsartan and Norvasc. Continue to monitor.   * Mechanical aortic valve replacement- goal INR 2-3 , but with his GI bleed and anemia, holding Coumadin at this time. Can consider low-dose heparin. Discussed with Dr. Mariah MillingGollan at length. We'll hold off anticoagulation based on discussion with GI.  * leukocytosis: Likely due to bacteremia  * Coagulopathy due to coumadin, hold coumadin, following clinically  * C. difficile Diarrhea: will change to PO vanco (per discussion with Dr Markham JordanElliot) this may be more  effective in stead of flagyl.  Management plans discussed with the patient, family- daughter, DR Conrad BurlingtonFitzgerald, Gollan  and they are in agreement.   DRUG ALLERGIES:  Allergies  Allergen Reactions  . Sulfa Antibiotics     unknown    CODE STATUS:     Code Status Orders        Start     Ordered   04/02/15 2123  Full code   Continuous     04/02/15 2122    Advance Directive Documentation        Most Recent Value   Type of Advance Directive  Healthcare Power of Attorney, Living will   Pre-existing out of facility DNR order (yellow form or pink MOST form)     "MOST" Form in Place?        TOTAL TIME TAKING CARE OF THIS PATIENT: 35 minutes.   Discussed with Dr Sampson GoonFitzgerald - will need to change PICC line before d/c. May get this tomorrow and hoping to d/c after that. If not will d/c on Friday.  [facility], Quantisha Marsicano M.D on 04/12/2015 at 1:02 PM  Between 7am to 6pm - Pager - [phone]  After 6pm go to [email or web address] - password [email or web address] [facility]  [facility]  Office  [phone]  CC: Primary care physician; [name], [name], MD

## 2015-04-12 NOTE — Progress Notes (Signed)
RN to place order for potassium PO 20meq once per Dr. Betti Cruzeddy, critical potassium 2.9.

## 2015-04-12 NOTE — Progress Notes (Signed)
Pt is alert and oriented, VSS, afebrile throughout shift. Denies pain, nausea, and anxiety. No BM throughout shift. Sleeping between care, will continue to monitor.

## 2015-04-12 NOTE — Progress Notes (Signed)
Pts VSS. K 2.9. 40 mEq given by PO. Scheduled antibiotics given. Will continue to monitor.

## 2015-04-12 NOTE — Progress Notes (Signed)
Per RN patient will likely be ready for D/C tomorrow. Clinical Child psychotherapistocial Worker (CSW) contacted [employer] admissions coordinator at [facility] and made her aware that patient has C-Diff, Picc line, and will likely need IV antibiotics. [facility] and have a private room when stable. CSW will continue to follow and assist as needed.   Jetta LoutBailey Morgan, LCSWA [phone]

## 2015-04-12 NOTE — Consult Note (Signed)
I discussed plan with patient and his daughter.  Plan to [facility].  He will get 2-4 weeks of iv Keflex for his staph.  He will get 10 days of vancomycin oral for his C. Diff.  On June 2ed I will do an EGD and colonoscopy and will see him [facility] on Monday 5/30 to finalize this plans.

## 2015-04-12 NOTE — Progress Notes (Signed)
CRITICAL VALUE ALERT  Critical value received: Potassium 2.9  Date of notification:  04/12/2015   Time of notification:  6:35 AM   Critical value read back:Yes.    Nurse who received alert:  Tyler PitaBecky Reap, RN  MD notified (1st page):  Dr. Betti Cruzeddy  Time of first page: 6:37 AM  MD notified (2nd page):  Time of second page:  Responding MD:  Dr. Betti Cruzeddy  Time MD responded: 6:40 AM

## 2015-04-12 NOTE — Progress Notes (Signed)
Clinical Child psychotherapistocial Worker (CSW) sent IV antibiotics orders to [facility] today. Anticipate D/C tomorrow. CSW will continue to follow and assist as needed.   Jetta LoutBailey Morgan, LCSWA [phone]

## 2015-04-13 ENCOUNTER — Inpatient Hospital Stay: Payer: Medicare Other

## 2015-04-13 LAB — POTASSIUM: Potassium: 3 mmol/L — ABNORMAL LOW (ref 3.5–5.1)

## 2015-04-13 LAB — MAGNESIUM: Magnesium: 1.6 mg/dL — ABNORMAL LOW (ref 1.7–2.4)

## 2015-04-13 MED ORDER — ALPRAZOLAM 0.25 MG PO TABS: 0.1250 mg | ORAL_TABLET | Freq: Every day | ORAL | Status: DC | PRN

## 2015-04-13 MED ORDER — ROPINIROLE HCL 1 MG PO TABS: 1.0000 mg | ORAL_TABLET | Freq: Three times a day (TID) | ORAL | Status: DC

## 2015-04-13 MED ORDER — CEFAZOLIN SODIUM-DEXTROSE 2-3 GM-% IV SOLR: 2.0000 g | Freq: Three times a day (TID) | INTRAVENOUS | Status: AC

## 2015-04-13 MED ORDER — SODIUM CHLORIDE 0.9 % IJ SOLN: 10.0000 mL | Freq: Two times a day (BID) | INTRAMUSCULAR | Status: DC

## 2015-04-13 MED ORDER — VANCOMYCIN 50 MG/ML ORAL SOLUTION: 250.0000 mg | Freq: Four times a day (QID) | ORAL | Status: DC

## 2015-04-13 MED ORDER — PANTOPRAZOLE SODIUM 40 MG PO TBEC: 40.0000 mg | DELAYED_RELEASE_TABLET | Freq: Two times a day (BID) | ORAL | Status: DC

## 2015-04-13 MED ORDER — ALPRAZOLAM 0.5 MG PO TABS: 0.1250 mg | ORAL_TABLET | Freq: Every day | ORAL | Status: DC | PRN

## 2015-04-13 MED ORDER — POTASSIUM CHLORIDE CRYS ER 20 MEQ PO TBCR
40.0000 meq | EXTENDED_RELEASE_TABLET | Freq: Once | ORAL | Status: AC
  Administered 2015-04-13: 40 meq via ORAL
  Filled 2015-04-13: qty 2

## 2015-04-13 MED ORDER — PANTOPRAZOLE SODIUM 40 MG IV SOLR: 40.0000 mg | Freq: Two times a day (BID) | INTRAVENOUS | Status: DC

## 2015-04-13 MED ORDER — MAGNESIUM SULFATE 2 GM/50ML IV SOLN
2.0000 g | Freq: Once | INTRAVENOUS | Status: AC
  Administered 2015-04-13: 2 g via INTRAVENOUS
  Filled 2015-04-13 (×2): qty 50

## 2015-04-13 NOTE — Progress Notes (Signed)
Chaplain mets with patient and his daughter the caregiver by his bedside; chaplain offered prayer and emotional support.  Kaleen Maskhaplain Raymond D. Clovis RileyMitchell Thursday 04-13-2015   04/13/15 1105  Clinical Encounter Type  Visited With Patient and family together  Visit Type Follow-up (Pastoral Care and Counseling)  Referral From Other (Comment) (Routine Pastoral Care Visit. )  Consult/Referral To Chaplain  Spiritual Encounters  Spiritual Needs Prayer;Emotional  Stress Factors  Patient Stress Factors Health changes (looking forward to discharge)  Family Stress Factors Health changes

## 2015-04-13 NOTE — Progress Notes (Signed)
EMS here to take patient to [facility]. Discharge via stretcher. Patient valuables sent with patient. Discharge paperwork sent with EMS staff.

## 2015-04-13 NOTE — Progress Notes (Signed)
Patient is medically stable for D/C to [facility] today. Per [name] admissions coordinator at [facility] patient is going [facility]. Patient will receive 6 weeks of IV antibiotics. RN will call report at [phone] and arrange EMS for transport. Per RN patient will receive 3 hours of IV antibiotics here before D/C. Patient will be ready around 5 or 6 this evening. Clinical Child psychotherapistocial Worker (CSW) prepared D/C packet and sent D/C Summary to [employer] via carefinder. CSW contacted patient's daughter [name] and and made her aware of above. Patient is aware of above. [name] admissions coordinator at [facility] is aware of above. Please reconsult if future social work needs arise. CSW signing off.   Jetta LoutBailey Morgan, LCSWA [phone]

## 2015-04-13 NOTE — Outcomes Assessment (Signed)
VSS, patient is A+O with no signs of distress. Mild complaint of restless leg which is relieved with PRN medication.  IV octreotide and protonix infusing.  Labs drawn.  Voids without difficulty and no emesis this shift.

## 2015-04-13 NOTE — Progress Notes (Signed)
Patient to go to [facility] today, Report called to nurse Latanya MaudlinAngela Byrum, RN, patient to go with PICC line inplace- right upper arm. EMS called -1611pm and notified of patient's pick up at 1830 post IV Magnesium infusion.

## 2015-04-13 NOTE — Discharge Summary (Signed)
[facility] - [place] at [facility]   PATIENT NAME: [name]    MR#:  [id number]  DATE OF BIRTH:  [birth year]  DATE OF ADMISSION:  04/02/2015 ADMITTING PHYSICIAN: Enid Baas, MD  DATE OF DISCHARGE: 04/13/2015  PRIMARY CARE PHYSICIAN: [name], MD    ADMISSION DIAGNOSIS:  Gastrointestinal hemorrhage, unspecified gastritis, unspecified gastrointestinal hemorrhage type [K92.2]  DISCHARGE DIAGNOSIS:  Principal Problem:   Anemia Active Problems:   Blood loss anemia   Bleeding gastrointestinal   S/P AVR (aortic valve replacement)   SECONDARY DIAGNOSIS:   Past Medical History  Diagnosis Date  . GERD (gastroesophageal reflux disease)   . Bradycardia     chronic, no symptoms 07/2010  . Depression   . Anxiety 10/11  . SOB (shortness of breath) 10/11    08/2010,Episodes at 5 AM, eventually felt to be anxiety, after complete workup including catheterization, pt greatly improved with anxiety meds 11/11  . Hypertension     BP higher than usual 04/19/10; amlodipine increased by telephone  . Decreased hearing     Right ear  . Coronary artery disease     mild, cath, 08/2010  . Aortic stenosis     a. s/p mechcanical AVR, 2002  . S/P AVR     a. [employer]. mechanical 2002; b. echo 08/2010 EF 60%, trival AI, mild MR, AVR working well; c. on longterm warfarin tx  . Carotid bruit     dopplers in past, no abnormalities    HOSPITAL COURSE:   Tyric Rodeheaver is a 81 y.o. male with a known history of hypertension, restless leg syndrome, mechanical aortic valve replacement surgery, osteoarthritis requiring bilateral hip replacement surgeries and a recent left total hip replacement surgery on 03/21/2015. He is currently at [facility] rehabilitation and was brought in secondary to weakness and noted to be anemic. His warfarin was held GI recommended 2 weeks hold off anticoagulation and endoscopy as an outpatient. He had received 2 units of packed red  blood cells while in the hospital and his H&H remained stable. In the hospital he was also noted to have staph aureus bacteremia for which he was started on IV Ancef. Infectious disease consultation was obtained with Dr. Clydie Braun who recommended 6 weeks course of IV antibiotic while PICC line. Patient was also evaluated by cardiology concerning his mechanical aortic while they recommended holding anticoagulation considering ongoing anemia workup.  Patient was noted to have left upper extremity edema and redness at the site of prior IV, left upper reduction to Doppler was performed which showed DVT. But due to concern for ongoing GI bleed no active correlation has been started this was discussed with patient and family they're in agreement with the same. Patient will follow-up with Dr. Mechele Collin as scheduled. He is scheduled for EGD and colonoscopy in June 2 by Dr. Mechele Collin. After which they will decide when to restart Coumadin. Patient is getting a new PICC line placed today and will get total 6 weeks of IV Ancef along with oral vancomycin as he was also positive for C. difficile while in the hospital. Dr. Clydie Braun in 2-3 weeks at which point he will get decision of how long his antibiotic need to be continued.  Patient's management and discharge plan has been discussed with family and all the consultants they are all in agreement patient is being discharged to rehabilitation facility in stable condition.  CONSULTS OBTAINED:  Treatment Team:  Scot Jun, MD Iran Ouch, MD  Delfino Lovett, MD Clydie Braun, MD  DRUG ALLERGIES:   Allergies  Allergen Reactions  . Sulfa Antibiotics     unknown    DISCHARGE MEDICATIONS:   Current Discharge Medication List    START taking these medications   Details  ceFAZolin (ANCEF) 2-3 GM-% SOLR Inject 50 mLs (2 g total) into the vein every 8 (eight) hours. Qty: 1000 mL, Refills: 0    pantoprazole (PROTONIX) 40 MG tablet Take 1  tablet (40 mg total) by mouth 2 (two) times daily. Qty: 60 tablet, Refills: 30    vancomycin (VANCOCIN) 50 mg/mL oral solution Take 5 mLs (250 mg total) by mouth every 6 (six) hours. Qty: 3000 mL, Refills: 0      CONTINUE these medications which have CHANGED   Details  ALPRAZolam (XANAX) 0.5 MG tablet Take 0.5 tablets (0.25 mg total) by mouth daily as needed for anxiety. Qty: 10 tablet, Refills: 0    rOPINIRole (REQUIP) 1 MG tablet Take 1 tablet (1 mg total) by mouth 3 (three) times daily. Qty: 10 tablet, Refills: 0      CONTINUE these medications which have NOT CHANGED   Details  acetaminophen (TYLENOL) 500 MG tablet Take 500 mg by mouth every 4 (four) hours as needed for mild pain.     aluminum-magnesium hydroxide 200-200 MG/5ML suspension Take 30 mLs by mouth 2 (two) times daily as needed for indigestion.    amLODipine (NORVASC) 10 MG tablet Take 1 tablet by mouth  daily Qty: 90 tablet, Refills: 0    aspirin 81 MG tablet Take 81 mg by mouth daily.      citalopram (CELEXA) 20 MG tablet Take 20 mg by mouth daily.    cyclobenzaprine (FLEXERIL) 10 MG tablet Take 10 mg by mouth 3 (three) times daily as needed for muscle spasms.    FERROUS FUMARATE-FOLIC ACID PO Take 1 capsule by mouth 2 (two) times daily with a meal.    finasteride (PROSCAR) 5 MG tablet Take 5 mg by mouth daily.      Multiple Vitamin (MULTIVITAMIN) tablet Take 1 tablet by mouth daily.      oxyCODONE (OXY IR/ROXICODONE) 5 MG immediate release tablet Take 5 mg by mouth every 4 (four) hours as needed for severe pain.    Tamsulosin HCl (FLOMAX) 0.4 MG CAPS Take 0.4 mg by mouth daily.      traZODone (DESYREL) 50 MG tablet Take 50 mg by mouth at bedtime.    valsartan (DIOVAN) 320 MG tablet Take 320 mg by mouth daily.        STOP taking these medications     bisacodyl (DULCOLAX) 10 MG suppository      docusate sodium (COLACE) 100 MG capsule      glycerin adult 2 G SUPP      warfarin (COUMADIN) 5 MG tablet       warfarin (COUMADIN) 7.5 MG tablet      celecoxib (CELEBREX) 100 MG capsule      warfarin (COUMADIN) 5 MG tablet          DISCHARGE INSTRUCTIONS:   He certainly remains at very high risk for any kind of clotting disorder considering his anticoagulant ablation will be on hold. He is also at risk for GI bleed he'll be on PPI twice a day at this time.  DIET:  Cardiac diet  DISCHARGE CONDITION:  Good  ACTIVITY:  Activity as tolerated  OXYGEN:  Home Oxygen: No.   Oxygen Delivery: room air  DISCHARGE LOCATION:  nursing  home   If you experience worsening of your admission symptoms, develop shortness of breath, life threatening emergency, suicidal or homicidal thoughts you must seek medical attention immediately by calling 911 or calling your MD immediately  if symptoms less severe.  You Must read complete instructions/literature along with all the possible adverse reactions/side effects for all the Medicines you take and that have been prescribed to you. Take any new Medicines after you have completely understood and accpet all the possible adverse reactions/side effects.   Please note  You were cared for by a hospitalist during your hospital stay. If you have any questions about your discharge medications or the care you received while you were in the hospital after you are discharged, you can call the unit and asked to speak with the hospitalist on call if the hospitalist that took care of you is not available. Once you are discharged, your primary care physician will handle any further medical issues. Please note that NO REFILLS for any discharge medications will be authorized once you are discharged, as it is imperative that you return to your primary care physician (or establish a relationship with a primary care physician if you do not have one) for your aftercare needs so that they can reassess your need for medications and monitor your lab values.  On the date of  discharge:   VITAL SIGNS:  Blood pressure 120/72, pulse 84, temperature 98.3 F (36.8 C), temperature source Oral, resp. rate 18, height 5\' 10"  (1.778 m), weight 86.183 kg (190 lb), SpO2 96 %.  I/O:    Intake/Output Summary (Last 24 hours) at 04/13/15 1232 Last data filed at 04/13/15 0900  Gross per 24 hour  Intake   1690 ml  Output   1275 ml  Net    415 ml    PHYSICAL EXAMINATION:  GENERAL:  82 y.o.-year-old patient lying in the bed with no acute distress.  EYES: Pupils equal, round, reactive to light and accommodation. No scleral icterus. Extraocular muscles intact.  HEENT: Head atraumatic, normocephalic. Oropharynx and nasopharynx clear.  NECK:  Supple, no jugular venous distention. No thyroid enlargement, no tenderness.  LUNGS: Normal breath sounds bilaterally, no wheezing, rales,rhonchi or crepitation. No use of accessory muscles of respiration.  CARDIOVASCULAR: S1, S2 normal. No murmurs, rubs, or gallops.  ABDOMEN: Soft, non-tender, non-distended. Bowel sounds present. No organomegaly or mass.  EXTREMITIES: No pedal edema, cyanosis, or clubbing.  NEUROLOGIC: Cranial nerves II through XII are intact. Muscle strength 5/5 in all extremities. Sensation intact. Gait not checked.  PSYCHIATRIC: The patient is alert and oriented x 3.  SKIN: No obvious rash, lesion, or ulcer.   DATA REVIEW:   CBC  Recent Labs Lab 04/12/15 0528  WBC 5.7  HGB 7.5*  HCT 22.8*  PLT 231    Chemistries   Recent Labs Lab 04/12/15 0528 04/13/15 0548  NA 129*  --   K 2.9* 3.0*  CL 98*  --   CO2 27  --   GLUCOSE 117*  --   BUN 12  --   CREATININE 0.61  --   CALCIUM 7.2*  --   MG  --  1.6*    Cardiac Enzymes No results for input(s): TROPONINI in the last 168 hours.  Microbiology Results  Results for orders placed or performed during the hospital encounter of 04/02/15  Culture, blood (routine x 2)     Status: None   Collection Time: 04/09/15  5:03 PM  Result Value Ref Range Status  Specimen Description BLOOD  Final   Special Requests Normal  Final   Culture  Setup Time   Final    GRAM POSITIVE COCCI IN BOTH AEROBIC AND ANAEROBIC BOTTLES CRITICAL RESULT CALLED TO, READ BACK BY AND VERIFIED WITH: BECKU REAP AT [id number] 04/10/15. TSH IDENTIFICATION TO FOLLOW CONFIRMED BY [facility]    Culture   Final    STAPHYLOCOCCUS AUREUS IN BOTH AEROBIC AND ANAEROBIC BOTTLES    Report Status 04/11/2015 FINAL  Final   Organism ID, Bacteria STAPHYLOCOCCUS AUREUS  Final      Susceptibility   Staphylococcus aureus - MIC*    CIPROFLOXACIN <=0.5 SENSITIVE Sensitive     ERYTHROMYCIN <=0.25 SENSITIVE Sensitive     GENTAMICIN <=0.5 SENSITIVE Sensitive     OXACILLIN <=0.25 SENSITIVE Sensitive     TETRACYCLINE <=1 SENSITIVE Sensitive     TRIMETH/SULFA <=10 SENSITIVE Sensitive     CLINDAMYCIN <=0.25 SENSITIVE Sensitive     CEFOXITIN SCREEN NEGATIVE Sensitive     Inducible Clindamycin NEGATIVE Sensitive     * STAPHYLOCOCCUS AUREUS  Culture, blood (routine x 2)     Status: None   Collection Time: 04/09/15  5:03 PM  Result Value Ref Range Status   Specimen Description BLOOD  Final   Special Requests Normal  Final   Culture  Setup Time   Final    GRAM POSITIVE COCCI IN BOTH AEROBIC AND ANAEROBIC BOTTLES CRITICAL RESULT CALLED TO, READ BACK BY AND VERIFIED WITH: BECKY REAP AT [id number] 04/10/15. TSH IDENTIFICATION TO FOLLOW CONFIRMED BY [facility]    Culture   Final    STAPHYLOCOCCUS AUREUS IN BOTH AEROBIC AND ANAEROBIC BOTTLES    Report Status 04/11/2015 FINAL  Final   Organism ID, Bacteria STAPHYLOCOCCUS AUREUS  Final      Susceptibility   Staphylococcus aureus - MIC*    CIPROFLOXACIN <=0.5 SENSITIVE Sensitive     ERYTHROMYCIN <=0.25 SENSITIVE Sensitive     GENTAMICIN <=0.5 SENSITIVE Sensitive     OXACILLIN <=0.25 SENSITIVE Sensitive     TETRACYCLINE <=1 SENSITIVE Sensitive     TRIMETH/SULFA <=10 SENSITIVE Sensitive     CLINDAMYCIN <=0.25 SENSITIVE Sensitive     CEFOXITIN SCREEN NEGATIVE  Sensitive     Inducible Clindamycin NEGATIVE Sensitive     * STAPHYLOCOCCUS AUREUS  Urine culture     Status: None   Collection Time: 04/09/15  6:36 PM  Result Value Ref Range Status   Specimen Description URINE, CLEAN CATCH  Final   Special Requests Normal  Final   Culture NO GROWTH 2 DAYS  Final   Report Status 04/11/2015 FINAL  Final  C difficile quick scan w PCR reflex [facility])     Status: None   Collection Time: 04/10/15  2:14 PM  Result Value Ref Range Status   C Diff antigen POSITIVE  Final   C Diff toxin NEGATIVE  Final   C Diff interpretation   Final    Positive for toxigenic C. difficile, active toxin production not detected. Patient has toxigenic C. difficile organisms present in the bowel, but toxin was not detected. The patient may be a carrier or the level of toxin in the sample was below the limit  of detection. This information should be used in conjunction with the patient's clinical history when deciding on possible therapy.   Clostridium Difficile by PCR     Status: Abnormal   Collection Time: 04/10/15  2:14 PM  Result Value Ref Range Status   C difficile by pcr POSITIVE (A)  NEGATIVE Final    Comment: CRITICAL RESULT CALLED TO, READ BACK BY AND VERIFIED WITH: ANNA RODRIGUEZ AT 1710 04/10/15 SDR   Culture, blood (routine x 2)     Status: None (Preliminary result)   Collection Time: 04/11/15  4:14 PM  Result Value Ref Range Status   Specimen Description BLOOD  Final   Special Requests Normal  Final   Culture NO GROWTH 2 DAYS  Final   Report Status PENDING  Incomplete  Culture, blood (routine x 2)     Status: None (Preliminary result)   Collection Time: 04/11/15  4:14 PM  Result Value Ref Range Status   Specimen Description BLOOD  Final   Special Requests Normal  Final   Culture NO GROWTH 2 DAYS  Final   Report Status PENDING  Incomplete    RADIOLOGY:  Dg Chest 2 View  04/11/2015   CLINICAL DATA:  Increase shortness of Breath  EXAM: CHEST  2 VIEW  COMPARISON:   04/09/2015  FINDINGS: Right-sided PICC line is again identified. Postsurgical changes are again seen. The cardiac shadow is stable but mildly enlarged. No focal infiltrate or sizable effusion is seen. Mild vascular congestion is noted without edema.  IMPRESSION: Mild vascular congestion without edema. No other focal abnormality is seen.   Electronically Signed   By: Alcide Clever M.D.   On: 04/11/2015 15:52   US Venous Img Upper Uni Left  04/12/2015   CLINICAL DATA:  Redness and swelling at previous IV site. Rule out DVT or abscess. Patient has a right arm PICC line.  EXAM: LEFT UPPER EXTREMITY VENOUS DOPPLER ULTRASOUND  TECHNIQUE: Gray-scale sonography with graded compression, as well as color Doppler and duplex ultrasound were performed to evaluate the upper extremity deep venous system from the level of the subclavian vein and including the jugular, axillary, basilic and upper cephalic vein. Spectral Doppler was utilized to evaluate flow at rest and with distal augmentation maneuvers.  COMPARISON:  None.  FINDINGS: Right upper extremity: There is a PICC line in the right subclavian vein. Color Doppler flow in the right subclavian vein with normal phasicity.  Left upper extremity: Normal compressibility, color Doppler flow and phasicity in the left internal jugular vein. Normal color Doppler flow and phasicity in the left subclavian vein. Normal compressibility of the distal left subclavian vein. Normal compressibility, color Doppler flow and augmentation in the left axillary vein. The proximal left cephalic vein is compressible has color Doppler flow. One of the brachial veins are is non compressible and has some echogenic thrombus. However, this vessel is small and could represent chronic thrombosis. The other visualized left brachial veins are patent.  There is partial compressibility of the left basilic vein in the mid upper arm which is consistent with nonocclusive thrombus. Visualized left radial veins  appear to be patent. Limited evaluation of the left ulnar veins.  There is extensive edema in the left lower arm with a thrombosed superficial vein which measures roughly 6 mm. There is no flow in this thrombosed superficial vein.  IMPRESSION: Positive for superficial thrombophlebitis involving a superficial vein in the left forearm. There is also nonocclusive thrombus in the left basilic vein.  There is deep vein thrombosis involving one of the left brachial veins. This thrombosed vein is small and could be chronic but age indeterminate.   Electronically Signed   By: Richarda Overlie M.D.   On: 04/12/2015 17:09    Management plans discussed with the patient, family and they are in agreement.  He remains at very high risk for readmission.  CODE STATUS: Full code  TOTAL TIME TAKING CARE OF THIS PATIENT: 45 minutes.    [facility] Center[facility], Maxima Skelton M.D on 04/13/2015 at 12:32 PM  Between 7am to 6pm - Pager - [phone]  After 6pm go to [email or web address] - password [email or web address] [facility]  [facility]  Office  [phone]  CC: Primary care physician; [name], [name], MD Dr. Clydie Braunavid Fitzgerald Dr. Julien Nordmannimothy Gollan Dr. Lynnae Prudeobert Elliott

## 2015-04-13 NOTE — Progress Notes (Signed)
Order from Dr.Shah to replace the PICC line today and remove the old PICC.  Vascular Nurse informed this am by [facility],PICC Nurses here at 1130 am to placed New PICC- 04/13/15 at 1225 pm procedure started. Old PICC removed from Right Upper arm by PICC nurse and tip cut - placed in specimen cup to be send to lab for culture. Tip send to lab per MD order. X-ray completed 1340. Okay to use PICC per PICC Nurse post x-ray.

## 2015-04-13 NOTE — Consult Note (Signed)
Discussed with patient and daughter, he will need iv antibiotics for his staph in his arm for 4-6 weeks.  Due to interaction of this antibiotic and the GI flora he may need prolonged vancomycin therapy.  Will see how he does with 2 weeks of vancomycin first.  If we stop it and he relapses will need vanc for the duration of the iv Ancef and 2 weeks after.  Some consideration could be given to do a stool transplant at the time of the colonoscopy for attempt at preventing the recurrent C, diff since I will be doing a colon with the EGD anyway.  I will ponder this question and talk to him the Monday before the colonoscopy.

## 2015-04-14 ENCOUNTER — Other Ambulatory Visit
Admission: RE | Admit: 2015-04-14 | Discharge: 2015-04-14 | Disposition: A | Discharge status: Home / Self Care | Payer: Medicare Other | Source: Skilled Nursing Facility | Attending: Internal Medicine | Admitting: Internal Medicine

## 2015-04-14 DIAGNOSIS — D649 Anemia, unspecified: Secondary | ICD-10-CM | POA: Diagnosis not present

## 2015-04-14 DIAGNOSIS — E876 Hypokalemia: Secondary | ICD-10-CM | POA: Insufficient documentation

## 2015-04-14 LAB — MAGNESIUM: Magnesium: 2.1 mg/dL (ref 1.7–2.4)

## 2015-04-14 LAB — BASIC METABOLIC PANEL
Anion gap: 6 (ref 5–15)
BUN: 8 mg/dL (ref 6–20)
CO2: 29 mmol/L (ref 22–32)
Calcium: 7.3 mg/dL — ABNORMAL LOW (ref 8.9–10.3)
Chloride: 94 mmol/L — ABNORMAL LOW (ref 101–111)
Creatinine, Ser: 0.57 mg/dL — ABNORMAL LOW (ref 0.61–1.24)
Glucose, Bld: 120 mg/dL — ABNORMAL HIGH (ref 65–99)
POTASSIUM: 3.1 mmol/L — AB (ref 3.5–5.1)
Sodium: 129 mmol/L — ABNORMAL LOW (ref 135–145)

## 2015-04-14 LAB — CBC WITH DIFFERENTIAL/PLATELET
BASOS ABS: 0 10*3/uL (ref 0–0.1)
BASOS PCT: 0 %
Eosinophils Absolute: 0.3 10*3/uL (ref 0–0.7)
Eosinophils Relative: 3 %
HEMATOCRIT: 26.5 % — AB (ref 40.0–52.0)
HEMOGLOBIN: 8.4 g/dL — AB (ref 13.0–18.0)
Lymphocytes Relative: 11 %
Lymphs Abs: 1.1 10*3/uL (ref 1.0–3.6)
MCH: 28.4 pg (ref 26.0–34.0)
MCHC: 31.8 g/dL — ABNORMAL LOW (ref 32.0–36.0)
MCV: 89.5 fL (ref 80.0–100.0)
MONO ABS: 1 10*3/uL (ref 0.2–1.0)
Monocytes Relative: 10 %
Neutro Abs: 7.4 10*3/uL — ABNORMAL HIGH (ref 1.4–6.5)
Neutrophils Relative %: 76 %
Platelets: 292 10*3/uL (ref 150–440)
RBC: 2.96 MIL/uL — ABNORMAL LOW (ref 4.40–5.90)
RDW: 17 % — ABNORMAL HIGH (ref 11.5–14.5)
WBC: 9.8 10*3/uL (ref 3.8–10.6)

## 2015-04-16 ENCOUNTER — Encounter: Payer: Self-pay | Admitting: *Deleted

## 2015-04-16 ENCOUNTER — Inpatient Hospital Stay
Admission: EM | Admit: 2015-04-16 | Discharge: 2015-04-20 | DRG: 378 | Disposition: A | Discharge status: Skilled Nursing Facility | Payer: Medicare Other | Attending: Internal Medicine | Admitting: Internal Medicine

## 2015-04-16 ENCOUNTER — Other Ambulatory Visit
Admission: RE | Admit: 2015-04-16 | Discharge: 2015-04-16 | Disposition: A | Discharge status: Home / Self Care | Payer: Medicare Other | Source: Other Acute Inpatient Hospital | Attending: Internal Medicine | Admitting: Internal Medicine

## 2015-04-16 ENCOUNTER — Other Ambulatory Visit: Payer: Self-pay

## 2015-04-16 DIAGNOSIS — E785 Hyperlipidemia, unspecified: Secondary | ICD-10-CM | POA: Diagnosis present

## 2015-04-16 DIAGNOSIS — Z96643 Presence of artificial hip joint, bilateral: Secondary | ICD-10-CM | POA: Diagnosis present

## 2015-04-16 DIAGNOSIS — F329 Major depressive disorder, single episode, unspecified: Secondary | ICD-10-CM | POA: Diagnosis present

## 2015-04-16 DIAGNOSIS — E876 Hypokalemia: Secondary | ICD-10-CM | POA: Diagnosis not present

## 2015-04-16 DIAGNOSIS — Z952 Presence of prosthetic heart valve: Secondary | ICD-10-CM

## 2015-04-16 DIAGNOSIS — F419 Anxiety disorder, unspecified: Secondary | ICD-10-CM | POA: Diagnosis present

## 2015-04-16 DIAGNOSIS — K259 Gastric ulcer, unspecified as acute or chronic, without hemorrhage or perforation: Secondary | ICD-10-CM | POA: Diagnosis present

## 2015-04-16 DIAGNOSIS — K921 Melena: Principal | ICD-10-CM | POA: Diagnosis present

## 2015-04-16 DIAGNOSIS — G2581 Restless legs syndrome: Secondary | ICD-10-CM | POA: Diagnosis present

## 2015-04-16 DIAGNOSIS — D649 Anemia, unspecified: Secondary | ICD-10-CM | POA: Insufficient documentation

## 2015-04-16 DIAGNOSIS — Z7901 Long term (current) use of anticoagulants: Secondary | ICD-10-CM | POA: Diagnosis not present

## 2015-04-16 DIAGNOSIS — K219 Gastro-esophageal reflux disease without esophagitis: Secondary | ICD-10-CM | POA: Diagnosis present

## 2015-04-16 DIAGNOSIS — Z882 Allergy status to sulfonamides status: Secondary | ICD-10-CM | POA: Diagnosis not present

## 2015-04-16 DIAGNOSIS — M1990 Unspecified osteoarthritis, unspecified site: Secondary | ICD-10-CM | POA: Diagnosis present

## 2015-04-16 DIAGNOSIS — I809 Phlebitis and thrombophlebitis of unspecified site: Secondary | ICD-10-CM | POA: Diagnosis present

## 2015-04-16 DIAGNOSIS — A4902 Methicillin resistant Staphylococcus aureus infection, unspecified site: Secondary | ICD-10-CM

## 2015-04-16 DIAGNOSIS — N400 Enlarged prostate without lower urinary tract symptoms: Secondary | ICD-10-CM | POA: Diagnosis present

## 2015-04-16 DIAGNOSIS — K922 Gastrointestinal hemorrhage, unspecified: Secondary | ICD-10-CM | POA: Diagnosis present

## 2015-04-16 DIAGNOSIS — D500 Iron deficiency anemia secondary to blood loss (chronic): Secondary | ICD-10-CM | POA: Diagnosis present

## 2015-04-16 DIAGNOSIS — I82622 Acute embolism and thrombosis of deep veins of left upper extremity: Secondary | ICD-10-CM | POA: Diagnosis present

## 2015-04-16 DIAGNOSIS — I2510 Atherosclerotic heart disease of native coronary artery without angina pectoris: Secondary | ICD-10-CM | POA: Diagnosis present

## 2015-04-16 DIAGNOSIS — B9562 Methicillin resistant Staphylococcus aureus infection as the cause of diseases classified elsewhere: Secondary | ICD-10-CM | POA: Diagnosis present

## 2015-04-16 DIAGNOSIS — A0472 Enterocolitis due to Clostridium difficile, not specified as recurrent: Secondary | ICD-10-CM

## 2015-04-16 DIAGNOSIS — I10 Essential (primary) hypertension: Secondary | ICD-10-CM | POA: Diagnosis present

## 2015-04-16 LAB — BASIC METABOLIC PANEL
Anion gap: 5 (ref 5–15)
BUN: 19 mg/dL (ref 6–20)
CALCIUM: 7.5 mg/dL — AB (ref 8.9–10.3)
CO2: 30 mmol/L (ref 22–32)
CREATININE: 0.73 mg/dL (ref 0.61–1.24)
Chloride: 97 mmol/L — ABNORMAL LOW (ref 101–111)
GFR calc Af Amer: >60 mL/min (ref >60)
GLUCOSE: 116 mg/dL — AB (ref 65–99)
POTASSIUM: 3.1 mmol/L — AB (ref 3.5–5.1)
Sodium: 132 mmol/L — ABNORMAL LOW (ref 135–145)

## 2015-04-16 LAB — CBC WITH DIFFERENTIAL/PLATELET
BASOS ABS: 0 10*3/uL (ref 0–0.1)
Basophils Relative: 0 %
Eosinophils Absolute: 0.2 10*3/uL (ref 0–0.7)
Eosinophils Relative: 3 %
HCT: 19.7 % — ABNORMAL LOW (ref 40.0–52.0)
HEMOGLOBIN: 6.3 g/dL — AB (ref 13.0–18.0)
Lymphocytes Relative: 16 %
Lymphs Abs: 1.4 10*3/uL (ref 1.0–3.6)
MCH: 28.4 pg (ref 26.0–34.0)
MCHC: 32.1 g/dL (ref 32.0–36.0)
MCV: 88.4 fL (ref 80.0–100.0)
MONO ABS: 0.8 10*3/uL (ref 0.2–1.0)
MONOS PCT: 9 %
NEUTROS ABS: 6.5 10*3/uL (ref 1.4–6.5)
Neutrophils Relative %: 72 %
PLATELETS: 329 10*3/uL (ref 150–440)
RBC: 2.22 MIL/uL — ABNORMAL LOW (ref 4.40–5.90)
RDW: 16.9 % — ABNORMAL HIGH (ref 11.5–14.5)
WBC: 9 10*3/uL (ref 3.8–10.6)

## 2015-04-16 LAB — COMPREHENSIVE METABOLIC PANEL
ALBUMIN: 2.4 g/dL — AB (ref 3.5–5.0)
ALT: 6 U/L — ABNORMAL LOW (ref 17–63)
ANION GAP: 5 (ref 5–15)
AST: 14 U/L — ABNORMAL LOW (ref 15–41)
Alkaline Phosphatase: 61 U/L (ref 38–126)
BUN: 18 mg/dL (ref 6–20)
CHLORIDE: 96 mmol/L — AB (ref 101–111)
CO2: 29 mmol/L (ref 22–32)
CREATININE: 0.68 mg/dL (ref 0.61–1.24)
Calcium: 7.4 mg/dL — ABNORMAL LOW (ref 8.9–10.3)
GFR calc non Af Amer: >60 mL/min (ref >60)
Glucose, Bld: 117 mg/dL — ABNORMAL HIGH (ref 65–99)
POTASSIUM: 3.2 mmol/L — AB (ref 3.5–5.1)
SODIUM: 130 mmol/L — AB (ref 135–145)
TOTAL PROTEIN: 4.7 g/dL — AB (ref 6.5–8.1)
Total Bilirubin: 0.2 mg/dL — ABNORMAL LOW (ref 0.3–1.2)

## 2015-04-16 LAB — CULTURE, BLOOD (ROUTINE X 2)
Culture: NO GROWTH
Culture: NO GROWTH
SPECIAL REQUESTS: NORMAL
Special Requests: NORMAL

## 2015-04-16 LAB — CBC
HEMATOCRIT: 20.3 % — AB (ref 40.0–52.0)
Hemoglobin: 6.8 g/dL — ABNORMAL LOW (ref 13.0–18.0)
MCH: 29.5 pg (ref 26.0–34.0)
MCHC: 33.3 g/dL (ref 32.0–36.0)
MCV: 88.7 fL (ref 80.0–100.0)
PLATELETS: 333 10*3/uL (ref 150–440)
RBC: 2.29 MIL/uL — ABNORMAL LOW (ref 4.40–5.90)
RDW: 17.1 % — AB (ref 11.5–14.5)
WBC: 9.4 10*3/uL (ref 3.8–10.6)

## 2015-04-16 LAB — PREPARE RBC (CROSSMATCH)

## 2015-04-16 MED ORDER — ONDANSETRON HCL 4 MG PO TABS: 4.0000 mg | ORAL_TABLET | Freq: Four times a day (QID) | ORAL | Status: DC | PRN

## 2015-04-16 MED ORDER — METRONIDAZOLE IN NACL 5-0.79 MG/ML-% IV SOLN
500.0000 mg | Freq: Three times a day (TID) | INTRAVENOUS | Status: DC
  Administered 2015-04-17 – 2015-04-18 (×5): 500 mg via INTRAVENOUS
  Filled 2015-04-16 (×9): qty 100

## 2015-04-16 MED ORDER — IRBESARTAN 150 MG PO TABS
300.0000 mg | ORAL_TABLET | Freq: Every day | ORAL | Status: DC
  Administered 2015-04-17 – 2015-04-20 (×4): 300 mg via ORAL
  Filled 2015-04-16 (×4): qty 2

## 2015-04-16 MED ORDER — SODIUM CHLORIDE 0.9 % IV SOLN
10.0000 mL/h | Freq: Once | INTRAVENOUS | Status: AC
  Administered 2015-04-16: 10 mL/h via INTRAVENOUS

## 2015-04-16 MED ORDER — CYCLOBENZAPRINE HCL 10 MG PO TABS: 10.0000 mg | ORAL_TABLET | Freq: Three times a day (TID) | ORAL | Status: DC | PRN

## 2015-04-16 MED ORDER — ONE-DAILY MULTI VITAMINS PO TABS
1.0000 | ORAL_TABLET | Freq: Every day | ORAL | Status: DC
  Administered 2015-04-16 – 2015-04-17 (×2): 1 via ORAL
  Filled 2015-04-16 (×2): qty 1

## 2015-04-16 MED ORDER — CEFAZOLIN SODIUM-DEXTROSE 2-3 GM-% IV SOLR
2.0000 g | Freq: Three times a day (TID) | INTRAVENOUS | Status: DC
  Administered 2015-04-17 – 2015-04-20 (×12): 2 g via INTRAVENOUS
  Filled 2015-04-16 (×16): qty 50

## 2015-04-16 MED ORDER — AMLODIPINE BESYLATE 10 MG PO TABS
10.0000 mg | ORAL_TABLET | Freq: Every day | ORAL | Status: DC
  Administered 2015-04-17 – 2015-04-20 (×4): 10 mg via ORAL
  Filled 2015-04-16 (×4): qty 1

## 2015-04-16 MED ORDER — TRAZODONE HCL 50 MG PO TABS
50.0000 mg | ORAL_TABLET | Freq: Every day | ORAL | Status: DC
  Administered 2015-04-16 – 2015-04-19 (×4): 50 mg via ORAL
  Filled 2015-04-16 (×5): qty 1

## 2015-04-16 MED ORDER — CITALOPRAM HYDROBROMIDE 20 MG PO TABS
20.0000 mg | ORAL_TABLET | Freq: Every day | ORAL | Status: DC
  Administered 2015-04-16 – 2015-04-20 (×5): 20 mg via ORAL
  Filled 2015-04-16 (×5): qty 1

## 2015-04-16 MED ORDER — ROPINIROLE HCL 1 MG PO TABS
1.0000 mg | ORAL_TABLET | Freq: Three times a day (TID) | ORAL | Status: DC
  Administered 2015-04-16 – 2015-04-20 (×12): 1 mg via ORAL
  Filled 2015-04-16 (×12): qty 1

## 2015-04-16 MED ORDER — ONDANSETRON HCL 4 MG/2ML IJ SOLN: 4.0000 mg | Freq: Four times a day (QID) | INTRAMUSCULAR | Status: DC | PRN

## 2015-04-16 MED ORDER — PANTOPRAZOLE SODIUM 40 MG IV SOLR
40.0000 mg | Freq: Two times a day (BID) | INTRAVENOUS | Status: DC
  Administered 2015-04-17 – 2015-04-20 (×8): 40 mg via INTRAVENOUS
  Filled 2015-04-16 (×8): qty 40

## 2015-04-16 MED ORDER — ACETAMINOPHEN 650 MG RE SUPP: 650.0000 mg | Freq: Four times a day (QID) | RECTAL | Status: DC | PRN

## 2015-04-16 MED ORDER — ACETAMINOPHEN 325 MG PO TABS
650.0000 mg | ORAL_TABLET | Freq: Four times a day (QID) | ORAL | Status: DC | PRN
  Administered 2015-04-18 (×2): 650 mg via ORAL
  Filled 2015-04-16 (×2): qty 2

## 2015-04-16 MED ORDER — POTASSIUM CHLORIDE IN NACL 20-0.9 MEQ/L-% IV SOLN
INTRAVENOUS | Status: DC
  Administered 2015-04-17 – 2015-04-19 (×5): via INTRAVENOUS
  Filled 2015-04-16 (×10): qty 1000

## 2015-04-16 MED ORDER — ACETAMINOPHEN 500 MG PO TABS: 500.0000 mg | ORAL_TABLET | ORAL | Status: DC | PRN

## 2015-04-16 MED ORDER — FINASTERIDE 5 MG PO TABS
5.0000 mg | ORAL_TABLET | Freq: Every day | ORAL | Status: DC
  Administered 2015-04-16 – 2015-04-20 (×5): 5 mg via ORAL
  Filled 2015-04-16 (×5): qty 1

## 2015-04-16 MED ORDER — ALPRAZOLAM 0.25 MG PO TABS: 0.1250 mg | ORAL_TABLET | Freq: Every day | ORAL | Status: DC | PRN

## 2015-04-16 MED ORDER — TAMSULOSIN HCL 0.4 MG PO CAPS
0.4000 mg | ORAL_CAPSULE | Freq: Every day | ORAL | Status: DC
  Administered 2015-04-16 – 2015-04-20 (×5): 0.4 mg via ORAL
  Filled 2015-04-16 (×5): qty 1

## 2015-04-16 NOTE — ED Provider Notes (Signed)
[facility] Emergency Department Provider Note  ____________________________________________  Time seen: 1705  I have reviewed the triage vital signs and the nursing notes.   HISTORY  Chief Complaint GI Bleeding  anemia    HPI [name] is a 81 y.o. male who recently stayed in the hospital due to an upper GI bleed. He now returns with a noted hemoglobin of 6.8 which is down almost 2 points from 2 days ago.  He denies any abdominal pain. He does have diarrhea which has a darker maroon color to it.  2 weeks he was admitted to the hospital for the original GI bleed. He is a patient who has a valve replacement and is on Coumadin. His Coumadin was stopped. When it restarted he began to bleed again and it was stopped again.  He had endoscopy with Dr. Mechele Collin. The daughter reports that it showed an ulceration that was healing but the likely source of bleeding for the patient. He was transfused during that earlier stay.  Since that initial hospitalization he has been diagnosed with C. difficile. He has been seen by Dr. Sampson Goon of infectious disease. He is taking antibiotics for that condition as well as for MRSA. He has a MRSA infection on his left wrist.     Past Medical History  Diagnosis Date  . GERD (gastroesophageal reflux disease)   . Bradycardia     chronic, no symptoms 07/2010  . Depression   . Anxiety 10/11  . SOB (shortness of breath) 10/11    08/2010,Episodes at 5 AM, eventually felt to be anxiety, after complete workup including catheterization, pt greatly improved with anxiety meds 11/11  . Hypertension     BP higher than usual 04/19/10; amlodipine increased by telephone  . Decreased hearing     Right ear  . Coronary artery disease     mild, cath, 08/2010  . Aortic stenosis     a. s/p mechcanical AVR, 2002  . S/P AVR     a. St. Jude. mechanical 2002; b. echo 08/2010 EF 60%, trival AI, mild MR, AVR working well; c. on longterm  warfarin tx  . Carotid bruit     dopplers in past, no abnormalities    Patient Active Problem List   Diagnosis Date Noted  . Blood loss anemia   . Bleeding gastrointestinal   . S/P AVR (aortic valve replacement)   . Anemia 04/02/2015  . GERD (gastroesophageal reflux disease)   . Bradycardia   . Depression   . Anxiety   . SOB (shortness of breath)   . Hypertension   . Decreased hearing   . Coronary artery disease   . Aortic stenosis   . S/P AVR   . Warfarin anticoagulation   . Ejection fraction   . Carotid bruit     Past Surgical History  Procedure Laterality Date  . Valve replacement  1/02    Aortic; echo 3/09 valve working well; echo 10/11 working well; put on Coumadin  . Cardiac catheterization    . Joint replacement    . Hernia repair    . Esophagogastroduodenoscopy N/A 04/05/2015    Procedure: ESOPHAGOGASTRODUODENOSCOPY (EGD);  Surgeon: Scot Jun, MD;  Location: [facility] ENDOSCOPY;  Service: Endoscopy;  Laterality: N/A;    Current Outpatient Rx  Name  Route  Sig  Dispense  Refill  . acetaminophen (TYLENOL) 500 MG tablet   Oral   Take 500 mg by mouth every 4 (four) hours as needed for mild pain.          Marland Kitchen  ALPRAZolam (XANAX) 0.5 MG tablet   Oral   Take 0.5 tablets (0.25 mg total) by mouth daily as needed for anxiety.   10 tablet   0   . aluminum-magnesium hydroxide 200-200 MG/5ML suspension   Oral   Take 30 mLs by mouth 2 (two) times daily as needed for indigestion.         Marland Kitchen. amLODipine (NORVASC) 10 MG tablet      Take 1 tablet by mouth  daily   90 tablet   0   . aspirin 81 MG tablet   Oral   Take 81 mg by mouth daily.           Marland Kitchen. ceFAZolin (ANCEF) 2-3 GM-% SOLR   Intravenous   Inject 50 mLs (2 g total) into the vein every 8 (eight) hours.   1000 mL   0   . citalopram (CELEXA) 20 MG tablet   Oral   Take 20 mg by mouth daily.         . cyclobenzaprine (FLEXERIL) 10 MG tablet   Oral   Take 10 mg by mouth 3 (three) times daily as  needed for muscle spasms.         Di Kindle. FERROUS FUMARATE-FOLIC ACID PO   Oral   Take 1 capsule by mouth 2 (two) times daily with a meal.         . finasteride (PROSCAR) 5 MG tablet   Oral   Take 5 mg by mouth daily.           . Multiple Vitamin (MULTIVITAMIN) tablet   Oral   Take 1 tablet by mouth daily.           Marland Kitchen. oxyCODONE (OXY IR/ROXICODONE) 5 MG immediate release tablet   Oral   Take 5 mg by mouth every 4 (four) hours as needed for severe pain.         . pantoprazole (PROTONIX) 40 MG tablet   Oral   Take 1 tablet (40 mg total) by mouth 2 (two) times daily.   60 tablet   30   . rOPINIRole (REQUIP) 1 MG tablet   Oral   Take 1 tablet (1 mg total) by mouth 3 (three) times daily.   10 tablet   0   . Tamsulosin HCl (FLOMAX) 0.4 MG CAPS   Oral   Take 0.4 mg by mouth daily.           . traZODone (DESYREL) 50 MG tablet   Oral   Take 50 mg by mouth at bedtime.         . valsartan (DIOVAN) 320 MG tablet   Oral   Take 320 mg by mouth daily.           . vancomycin (VANCOCIN) 50 mg/mL oral solution   Oral   Take 5 mLs (250 mg total) by mouth every 6 (six) hours.   3000 mL   0     Allergies Sulfa antibiotics  Family History  Problem Relation Age of Onset  . Hypertension      Social History History  Substance Use Topics  . Smoking status: Former Games developermoker  . Smokeless tobacco: Not on file  . Alcohol Use: 1.2 oz/week    0 Standard drinks or equivalent, 2 Glasses of wine per week     Comment: Social    Review of Systems  Constitutional: Negative for fever. ENT: Negative for sore throat. Cardiovascular: Negative for chest pain. Respiratory: Negative for shortness of  breath. Gastrointestinal: Positive for C. difficile and for GI bleed. See history of present illness. Genitourinary: Negative for dysuria. Musculoskeletal: Patient reports pain to the back part of his left heel.  Skin: Notable for MRSA infection on his left wrist.. Neurological:  Negative for headaches   10-point ROS otherwise negative.  ____________________________________________   PHYSICAL EXAM:  VITAL SIGNS: ED Triage Vitals  Enc Vitals Group     BP 04/16/15 1641 139/61 mmHg     Pulse Rate 04/16/15 1641 90     Resp 04/16/15 1641 16     Temp 04/16/15 1632 98.2 F (36.8 C)     Temp Source 04/16/15 1632 Oral     SpO2 04/16/15 1641 100 %     Weight 04/16/15 1641 198 lb 13.7 oz (90.2 kg)     Height 04/16/15 1641  (1.778 m)     Head Cir --      Peak Flow --      Pain Score --      Pain Loc --      Pain Edu? --      Excl. in GC? --     Constitutional: Alert and oriented. Well appearing and in no distress. ENT   Head: Normocephalic and atraumatic.   Cardiovascular: Normal rate, regular rhythm. There is a small mechanical click noted. Respiratory: Normal respiratory effort without tachypnea. Breath sounds are clear and equal bilaterally. No wheezes/rales/rhonchi. Gastrointestinal: Soft and nontender. No distention.  Back: No muscle spasm, no tenderness, no CVA tenderness. Musculoskeletal: Nontender with normal range of motion in all extremities.  No noted edema. Neurologic:  Normal speech and language. No gross focal neurologic deficits are appreciated.  Skin:  Skin is warm, dry. No rash noted. He has a dressing on his left wrist over the MRSA infection. He complains of pain at the left heel. Examination shows a small early-stage ulceration on the heel. Psychiatric: Mood and affect are normal. Speech and behavior are normal.  ____________________________________________    LABS (pertinent positives/negatives)  Hemoglobin of 6.8 noted prior to arrival. This blood work had been drawn at [facility] and process to the [facility] system. Repeat hemoglobin is pending as well as a type and screen.  ____________________________________________   EKG  EKG obtained at 1637 This EKG was interpreted by me.  Sinus rhythm with premature supraventricular complexes at a rate of 89 with normal intervals and a normal axis. The T wave is slightly flat in leads 3, aVF, and V6, but otherwise normal.  ____________________________________________   PROCEDURES    Critical Care performed: Patient with noted anemia. With the significant drop in hemoglobin we have planned to transfuse him. This was discussed with the patient. Further discussion with family and with other medical staff. Total critical care time of 30 minutes.   CRITICAL CARE Performed by: Darien Ramus   Total critical care time: 30 minutes  Critical care time was exclusive of separately billable procedures and treating other patients.  Critical care was necessary to treat or prevent imminent or life-threatening deterioration.  Critical care was time spent personally by me on the following activities: development of treatment plan with patient and/or surrogate as well as nursing, discussions with consultants, evaluation of patient's response to treatment, examination of patient, obtaining history from patient or surrogate, ordering and performing treatments and interventions, ordering and review of laboratory studies, ordering and review of radiographic studies, pulse oximetry and re-evaluation of patient's condition.  ____________________________________________   INITIAL IMPRESSION /  ASSESSMENT AND PLAN / ED COURSE  Health care patient with C. difficile and MRSA and a GI bleed. History of anticoagulation although he is not anticoagulated currently. His blood count is dropping from 2 days ago. We will transfuse the patient and admit him to the hospital for ongoing care. This was discussed with Dr. Quentin Cornwall  ____________________________________________   FINAL CLINICAL IMPRESSION(S) / ED DIAGNOSES  Final diagnoses:  Upper GI bleed  Blood loss anemia  C. difficile colitis  MRSA (methicillin resistant Staphylococcus aureus)       Darien Ramus, MD 04/16/15 1757

## 2015-04-16 NOTE — ED Notes (Signed)
[facility] reporting dark stool this am. Pt recently seen in hospital for dark stool and possible anemia, discharged Thursday. Per EMS pts last hgb was 6.8. Pt deneis feeling weak or lightheaded at this time. Pt denies pain. Pt dx with C-dif and MRSA. MRSA located on left wrist.

## 2015-04-16 NOTE — H&P (Signed)
[facility] [place] [facility]   PATIENT NAME: [name]    MR#:  [id number]  DATE OF BIRTH:  06-Dec-1930  DATE OF ADMISSION:  04/16/2015  PRIMARY CARE PHYSICIAN: [name], MD   REQUESTING/REFERRING PHYSICIAN: Dr. Carollee Massed.    CHIEF COMPLAINT:   Chief Complaint  Patient presents with  . GI Bleeding   Diarrhea, Melanotic stools.    HISTORY OF PRESENT ILLNESS:  [name]  is a 81 y.o. male with a known history of past medical history of a St. Jude's aortic valve, hypertension, GERD, anxiety/depression, history of coronary disease, recent C. difficile colitis, recent upper GI bleed, who presented to the hospital due to melanotic stools, diarrhea and noted to be anemic.  Patient denies any abdominal pain, fever, chills, nausea vomiting, chest pain, shortness of breath or any other associated symptoms presently.  Patient was noted to be anemic and suspected to have an upper GI bleed and therefore hospitalist services were contacted for further treatment and evaluation.    PAST MEDICAL HISTORY:   Past Medical History  Diagnosis Date  . GERD (gastroesophageal reflux disease)   . Bradycardia     chronic, no symptoms 07/2010  . Depression   . Anxiety 10/11  . SOB (shortness of breath) 10/11    08/2010,Episodes at 5 AM, eventually felt to be anxiety, after complete workup including catheterization, pt greatly improved with anxiety meds 11/11  . Hypertension     BP higher than usual 04/19/10; amlodipine increased by telephone  . Decreased hearing     Right ear  . Coronary artery disease     mild, cath, 08/2010  . Aortic stenosis     a. s/p mechcanical AVR, 2002  . S/P AVR     a. St. Jude. mechanical 2002; b. echo 08/2010 EF 60%, trival AI, mild MR, AVR working well; c. on longterm warfarin tx  . Carotid bruit     dopplers in past, no abnormalities    PAST SURGICAL HISTORY:   Past Surgical History  Procedure Laterality Date  .  Valve replacement  1/02    Aortic; echo 3/09 valve working well; echo 10/11 working well; put on Coumadin  . Cardiac catheterization    . Joint replacement    . Hernia repair    . Esophagogastroduodenoscopy N/A 04/05/2015    Procedure: ESOPHAGOGASTRODUODENOSCOPY (EGD);  Surgeon: Scot Jun, MD;  Location: [facility] ENDOSCOPY;  Service: Endoscopy;  Laterality: N/A;    SOCIAL HISTORY:   History  Substance Use Topics  . Smoking status: Former Games developer  . Smokeless tobacco: Not on file  . Alcohol Use: 1.2 oz/week    2 Glasses of wine, 0 Standard drinks or equivalent per week     Comment: Social    FAMILY HISTORY:   Family History  Problem Relation Age of Onset  . Hypertension    . Hypertension Mother   . Diabetes type II Mother   . Heart disease Mother   . Hypertension Father   . Heart disease Father     DRUG ALLERGIES:   Allergies  Allergen Reactions  . Levofloxacin Nausea Only  . Sulfa Antibiotics Other (See Comments)    unknown    REVIEW OF SYSTEMS:   Review of Systems  Constitutional: Negative for fever and weight loss.  HENT: Negative for congestion, nosebleeds and tinnitus.   Eyes: Negative for blurred vision, double vision and redness.  Respiratory: Negative for cough, hemoptysis and shortness of breath.  Cardiovascular: Negative for chest pain, orthopnea, leg swelling and PND.  Gastrointestinal: Positive for diarrhea and melena. Negative for nausea, vomiting and abdominal pain.  Genitourinary: Negative for dysuria, urgency and hematuria.  Musculoskeletal: Negative for joint pain and falls.  Neurological: Positive for weakness (generalized). Negative for dizziness, tingling, sensory change, focal weakness, seizures and headaches.  Endo/Heme/Allergies: Negative for polydipsia. Does not bruise/bleed easily.  Psychiatric/Behavioral: Negative for depression and memory loss. The patient is not nervous/anxious.     MEDICATIONS AT HOME:   Prior to Admission  medications   Medication Sig Start Date End Date Taking? Authorizing Provider  acetaminophen (TYLENOL) 500 MG tablet Take 500 mg by mouth every 4 (four) hours as needed for mild pain.    Yes Historical Provider, MD  ALPRAZolam Prudy Feeler) 0.5 MG tablet Take 0.5 tablets (0.25 mg total) by mouth daily as needed for anxiety. 04/13/15  Yes Delfino Lovett, MD  amLODipine (NORVASC) 10 MG tablet Take 1 tablet by mouth  daily 01/20/15  Yes Luis Abed, MD  ceFAZolin (ANCEF) 2-3 GM-% SOLR Inject 50 mLs (2 g total) into the vein every 8 (eight) hours. 04/13/15 05/23/15 Yes Vipul Sherryll Burger, MD  celecoxib (CELEBREX) 200 MG capsule Take 200 mg by mouth 2 (two) times daily as needed. For inflammation. 03/28/15  Yes Historical Provider, MD  citalopram (CELEXA) 20 MG tablet Take 20 mg by mouth daily.   Yes Historical Provider, MD  cyclobenzaprine (FLEXERIL) 10 MG tablet Take 10 mg by mouth 3 (three) times daily as needed for muscle spasms.   Yes Historical Provider, MD  finasteride (PROSCAR) 5 MG tablet Take 5 mg by mouth daily.     Yes Historical Provider, MD  Multiple Vitamin (MULTIVITAMIN) tablet Take 1 tablet by mouth daily.     Yes Historical Provider, MD  oxyCODONE (OXY IR/ROXICODONE) 5 MG immediate release tablet Take 5 mg by mouth every 4 (four) hours as needed for severe pain.   Yes Historical Provider, MD  rOPINIRole (REQUIP) 1 MG tablet Take 1 tablet (1 mg total) by mouth 3 (three) times daily. 04/13/15  Yes Vipul Sherryll Burger, MD  Tamsulosin HCl (FLOMAX) 0.4 MG CAPS Take 0.4 mg by mouth daily.     Yes Historical Provider, MD  traZODone (DESYREL) 50 MG tablet Take 50 mg by mouth at bedtime.   Yes Historical Provider, MD  valsartan (DIOVAN) 320 MG tablet Take 320 mg by mouth daily.     Yes Historical Provider, MD  vancomycin (VANCOCIN) 50 mg/mL oral solution Take 5 mLs (250 mg total) by mouth every 6 (six) hours. 04/13/15 06/02/15 Yes Vipul Sherryll Burger, MD  aluminum-magnesium hydroxide 200-200 MG/5ML suspension Take 30 mLs by mouth 2 (two)  times daily as needed for indigestion.    Historical Provider, MD  pantoprazole (PROTONIX) 40 MG tablet Take 1 tablet (40 mg total) by mouth 2 (two) times daily. 04/13/15   Delfino Lovett, MD      VITAL SIGNS:  Blood pressure 139/61, pulse 90, resp. rate 16, height 5\' 10"  (1.778 m), weight 90.2 kg (198 lb 13.7 oz), SpO2 100 %.  PHYSICAL EXAMINATION:  Physical Exam  GENERAL:  81 y.o.-year-old pale appearing male patient lying in the bed with no acute distress.  EYES: Pupils equal, round, reactive to light and accommodation. No scleral icterus. Extraocular muscles intact.  HEENT: Head atraumatic, normocephalic. Oropharynx and nasopharynx clear. No oropharyngeal erythema, moist oral mucosa  NECK:  Supple, no jugular venous distention. No thyroid enlargement, no tenderness.  LUNGS: Normal breath sounds  bilaterally, no wheezing, rales, rhonchi. No use of accessory muscles of respiration.  CARDIOVASCULAR: S1, S2 normal. + metallic valve click, No murmurs, rubs, or gallops.  ABDOMEN: Soft, nontender, nondistended. Bowel sounds present. No organomegaly or mass.  EXTREMITIES: No pedal edema, cyanosis, or clubbing. + 2 pedal & radial pulses b/l.   NEUROLOGIC: Cranial nerves II through XII are intact. No focal Motor or sensory deficits appreciated b/l PSYCHIATRIC: The patient is alert and oriented x 3. Good affect.  SKIN: No obvious rash, lesion, or ulcer.   LABORATORY PANEL:   CBC  Recent Labs Lab 04/16/15 1758  WBC 9.0  HGB 6.3*  HCT 19.7*  PLT 329   ------------------------------------------------------------------------------------------------------------------  Chemistries   Recent Labs Lab 04/14/15 1600  04/16/15 1758  NA 129*  < > 130*  K 3.1*  < > 3.2*  CL 94*  < > 96*  CO2 29  < > 29  GLUCOSE 120*  < > 117*  BUN 8  < > 18  CREATININE 0.57*  < > 0.68  CALCIUM 7.3*  < > 7.4*  MG 2.1  --   --   AST  --   --  14*  ALT  --   --  6*  ALKPHOS  --   --  61  BILITOT  --   --   0.2*  < > = values in this interval not displayed. ------------------------------------------------------------------------------------------------------------------  Cardiac Enzymes No results for input(s): TROPONINI in the last 168 hours. ------------------------------------------------------------------------------------------------------------------  RADIOLOGY:  No results found.   IMPRESSION AND PLAN:   81 year old male with past medical history of a St. Jude's aortic valve, history of recent GI bleed, history of C. difficile colitis, history of coronary disease, hypertension, hyperlipidemia, GERD, anxiety/depression, who presented to the hospital due to melanotic stools and diarrhea and noted to be anemic.  #1 upper GI bleed - likely cause of patient's anemia and melanotic stools. Patient had a recent endoscopy done by Dr. Mechele Collin which showed a hiatal hernia but no evidence of acute bleeding. Patient apparently was on Coumadin for a recent left upper extremity DVT and also due to his St. Jude's aortic valve which has been stopped.   - His hemoglobin was above 8 on discharge 4 days ago and now it's down to 6.3 on admission. - Patient is going to be transfused 1 unit of packed red blood cells will follow serial hemoglobin. - Been nothing by mouth for now, start him on IV Protonix. I will get a GI consult. I discussed the case with Dr. Mechele Collin who will see the patient in the morning.  #2 diarrhea - patient recently had C. difficile colitis I suspect this is due to that. - I'll start him on IV Flagyl now as he is going to be nothing by mouth.  She was on oral vancomycin at the skilled nursing facility. I will repeat his C. difficile toxin  #3 staph aureus bacteremia - patient apparently developed a staph infection due to a infected peripheral IV and developed bacteremia subsequently. He was discharged with a PICC line on IV Ancef which I will continue for now. - I will get infectious  disease consult  #4 history of St. Jude's valve - patient's Coumadin for hold now due to GI bleed    #5 hypertension - hemodynamically stable. Continue irbesartan Norvasc    #6 restless leg syndrome continue Requip   #7 BPH continue Flomax   All the records are reviewed and case discussed with ED  provider. Management plans discussed with the patient, family and they are in agreement.  CODE STATUS: Full  TIME TAKING CARE OF THIS PATI 55 minutes.    Houston SirenSAINANI,Monserrate Blaschke J M.D on 04/16/2015 at 6:49 PM  Between 7am to 6pm - Pager - [phone]  After 6pm go to [email or web address] - password [email or web address] [facility]  [facility]  Office  [phone]  CC: Primary care physician; [name], [name], MD

## 2015-04-17 ENCOUNTER — Encounter: Payer: Self-pay | Admitting: Anesthesiology

## 2015-04-17 ENCOUNTER — Encounter
Admission: EM | Disposition: A | Discharge status: Skilled Nursing Facility | Payer: Self-pay | Source: Home / Self Care | Attending: Internal Medicine

## 2015-04-17 ENCOUNTER — Inpatient Hospital Stay: Payer: Medicare Other | Admitting: Anesthesiology

## 2015-04-17 HISTORY — PX: ESOPHAGOGASTRODUODENOSCOPY: SHX5428

## 2015-04-17 LAB — BASIC METABOLIC PANEL
Anion gap: 6 (ref 5–15)
BUN: 13 mg/dL (ref 6–20)
CHLORIDE: 99 mmol/L — AB (ref 101–111)
CO2: 28 mmol/L (ref 22–32)
Calcium: 7.2 mg/dL — ABNORMAL LOW (ref 8.9–10.3)
Creatinine, Ser: 0.59 mg/dL — ABNORMAL LOW (ref 0.61–1.24)
GFR calc non Af Amer: >60 mL/min (ref >60)
Glucose, Bld: 97 mg/dL (ref 65–99)
Potassium: 3.2 mmol/L — ABNORMAL LOW (ref 3.5–5.1)
SODIUM: 133 mmol/L — AB (ref 135–145)

## 2015-04-17 LAB — CBC
HEMATOCRIT: 21.1 % — AB (ref 40.0–52.0)
Hemoglobin: 7 g/dL — ABNORMAL LOW (ref 13.0–18.0)
MCH: 29.3 pg (ref 26.0–34.0)
MCHC: 33.1 g/dL (ref 32.0–36.0)
MCV: 88.4 fL (ref 80.0–100.0)
PLATELETS: 304 10*3/uL (ref 150–440)
RBC: 2.39 MIL/uL — AB (ref 4.40–5.90)
RDW: 16.4 % — AB (ref 11.5–14.5)
WBC: 7.2 10*3/uL (ref 3.8–10.6)

## 2015-04-17 LAB — CATH TIP CULTURE: Culture: NO GROWTH

## 2015-04-17 LAB — C DIFFICILE QUICK SCREEN W PCR REFLEX
C DIFFICILE (CDIFF) INTERP: NEGATIVE
C Diff antigen: NEGATIVE
C Diff toxin: NEGATIVE

## 2015-04-17 LAB — PREPARE RBC (CROSSMATCH)

## 2015-04-17 LAB — MAGNESIUM: Magnesium: 2 mg/dL (ref 1.7–2.4)

## 2015-04-17 SURGERY — EGD (ESOPHAGOGASTRODUODENOSCOPY)
Anesthesia: General

## 2015-04-17 SURGERY — EGD (ESOPHAGOGASTRODUODENOSCOPY)
Anesthesia: Monitor Anesthesia Care

## 2015-04-17 MED ORDER — PHENYLEPHRINE HCL 10 MG/ML IJ SOLN
INTRAMUSCULAR | Status: DC | PRN
  Administered 2015-04-17 (×2): 100 ug via INTRAVENOUS

## 2015-04-17 MED ORDER — SODIUM CHLORIDE 0.9 % IV SOLN
Freq: Once | INTRAVENOUS | Status: AC
  Administered 2015-04-17: 16:00:00 via INTRAVENOUS

## 2015-04-17 MED ORDER — PIPERACILLIN-TAZOBACTAM 3.375 G IVPB
3.3750 g | Freq: Once | INTRAVENOUS | Status: DC
  Administered 2015-04-17: 3.375 g via INTRAVENOUS
  Filled 2015-04-17: qty 50

## 2015-04-17 MED ORDER — POTASSIUM CHLORIDE CRYS ER 20 MEQ PO TBCR
40.0000 meq | EXTENDED_RELEASE_TABLET | ORAL | Status: AC
  Administered 2015-04-17 (×2): 40 meq via ORAL
  Filled 2015-04-17 (×2): qty 2

## 2015-04-17 MED ORDER — PROPOFOL 10 MG/ML IV BOLUS
INTRAVENOUS | Status: DC | PRN
  Administered 2015-04-17: 30 mg via INTRAVENOUS

## 2015-04-17 MED ORDER — SODIUM CHLORIDE 0.9 % IV SOLN: INTRAVENOUS | Status: DC

## 2015-04-17 MED ORDER — PROPOFOL INFUSION 10 MG/ML OPTIME
INTRAVENOUS | Status: DC | PRN
  Administered 2015-04-17: 140 ug/kg/min via INTRAVENOUS

## 2015-04-17 MED ORDER — SODIUM CHLORIDE 0.9 % IV SOLN
INTRAVENOUS | Status: DC
  Administered 2015-04-17: 16:00:00 via INTRAVENOUS

## 2015-04-17 MED ORDER — EPINEPHRINE HCL 0.1 MG/ML IJ SOSY
PREFILLED_SYRINGE | INTRAMUSCULAR | Status: AC
  Filled 2015-04-17: qty 10

## 2015-04-17 NOTE — Clinical Social Work Note (Signed)
Clinical Social Work Assessment  Patient Details  Name: [name] MRN: [id number] Date of Birth: 08/21/1931  Date of referral:  04/17/15               Reason for consult:  Facility Placement, Other (Comment Required) (From [facility] )                Permission sought to share information with:  Chartered certified accountant granted to share information::  Yes, Verbal Permission Granted  Name::      [name]::   [employer]  Relationship::   Admissions Administrator, arts Information:     Housing/Transportation Living arrangements for the past 2 months:  [place] of Information:  Patient, Adult Children Patient Interpreter Needed:  None Criminal Activity/Legal Involvement Pertinent to Current Situation/Hospitalization:  No - Comment as needed Significant Relationships:  Adult Children Lives with:  Facility Resident, Spouse Do you feel safe going back to the place where you live?  Yes Need for family participation in patient care:  No (Coment)  Care giving concerns: Patient is from [place] Worker assessment / plan: Holiday representative ([place]) is familiar with patient. Patient is a readmission and was D/C'ed back to [facility] last week with a PICC Line and 6 weeks of IV antibiotics. CSW met with patient and his daughter [name] HPOA [phone] was at bedside. Patient has been living at the [facility] along with his wife. Patient and his wife are Independent Living Residents at [facility]. Patient's wife has MS and stays at [facility] when patient is at the hospital or rehab. CSW provided emotional support to patient and daughter [name]. Patient has been in and out of the hospital the last month. Patient came to hospital this time for a blood transfusion. Plan is for patient to return to the [facility] when medically stable. CSW confirmed with [name] admissions  coordinator at [facility] that patient can return. FL2 updated and faxed out. CSW will continue to follow and assist as needed.   Blima Rich, [place] [phone]  Employment status:  Retired Nurse, adult PT Recommendations:  Not assessed at this time Information / Referral to community resources:  [place]  Patient/Family's Response to care: Patient and daughter [name] are in agreement for patient to return to [place].   Patient/Family's Understanding of and Emotional Response to Diagnosis, Current Treatment, and Prognosis: CSW provided emotional support while patient discussed being in and out of the hospital several times over the past month.   Emotional Assessment Appearance:  Appears stated age Attitude/Demeanor/Rapport:    Affect (typically observed):  Accepting Orientation:  Oriented to Self, Oriented to Place, Oriented to  Time, Oriented to Situation Alcohol / Substance use:  Not Applicable Psych involvement (Current and /or in the community):  No (Comment)  Discharge Needs  Concerns to be addressed:  Discharge Planning Concerns Readmission within the last 30 days:  Yes Current discharge risk:  Chronically ill Barriers to Discharge:  Continued Medical Work up   Loralyn Freshwater, LCSW 04/17/2015, 12:20 PM

## 2015-04-17 NOTE — Progress Notes (Signed)
Patient received 1 unit prbc this shift. cdiff specimen sent. Iv fluids infusing. Loose dark coffee ground colored stools x 2 .up to bsc with assist . No c/o discomfort.ulcer on left wrist redressed . Patient alert and oriented. Just d/c ed from hospital last week right arm PICC in use . Patient resting well

## 2015-04-17 NOTE — Anesthesia Preprocedure Evaluation (Addendum)
Anesthesia Evaluation  Patient identified by MRN, date of birth, ID band Patient awake    Reviewed: Allergy & Precautions, H&P , NPO status , Patient's Chart, lab work & pertinent test results  Airway Mallampati: III  TM Distance: >3 FB     Dental  (+) Chipped, Poor Dentition   Pulmonary shortness of breath, former smoker,          Cardiovascular hypertension, + CAD     Neuro/Psych PSYCHIATRIC DISORDERS Anxiety Depression    GI/Hepatic GERD-  ,  Endo/Other    Renal/GU      Musculoskeletal   Abdominal   Peds  Hematology  (+) anemia ,   Anesthesia Other Findings SOB not cardiac related. Overbite.  Reproductive/Obstetrics                            Anesthesia Physical Anesthesia Plan  ASA: III  Anesthesia Plan: General   Post-op Pain Management:    Induction: Intravenous  Airway Management Planned: Nasal Cannula  Additional Equipment:   Intra-op Plan:   Post-operative Plan:   Informed Consent: I have reviewed the patients History and Physical, chart, labs and discussed the procedure including the risks, benefits and alternatives for the proposed anesthesia with the patient or authorized representative who has indicated his/her understanding and acceptance.     Plan Discussed with: CRNA  Anesthesia Plan Comments:        Anesthesia Quick Evaluation

## 2015-04-17 NOTE — Transfer of Care (Signed)
Immediate Anesthesia Transfer of Care Note  Patient: [name] BergerSydnor Arnold Prothero  Procedure(s) Performed: Procedure(s): ESOPHAGOGASTRODUODENOSCOPY (EGD) (N/A)  Patient Location: PACU  Anesthesia Type:General  Level of Consciousness: sedated  Airway & Oxygen Therapy: Patient Spontanous Breathing and Patient connected to nasal cannula oxygen  Post-op Assessment: Report given to RN and Post -op Vital signs reviewed and stable  Post vital signs: Reviewed and stable  Last Vitals:  Filed Vitals:   04/17/15 1610  BP: 137/54  Pulse: 82  Temp: 37.3 C  Resp: 17    Complications: No apparent anesthesia complications

## 2015-04-17 NOTE — Care Management Note (Addendum)
Case Management Note  Patient Details  Name: [name] MRN: [id number] Date of Birth: Apr 20, 1931  Subjective/Objective:   Readmit from [facility]. Plan is return to facility. Admitted with GI bleed, HGB 7.0 CSW following.                  Action/Plan:   Expected Discharge Date:                  Expected Discharge Plan:  Skilled Nursing Facility  In-House Referral:  Clinical Social Work  Discharge planning Services     Post Acute Care Choice:    Choice offered to:     DME Arranged:    DME Agency:     HH Arranged:    HH Agency:     Status of Service:     Medicare Important Message Given:  Yes Date Medicare IM Given:  04/17/15 Medicare IM give by:  Gweneth DimitriLisa Lylia Karn Date Additional Medicare IM Given:    Additional Medicare Important Message give by:     If discussed at Long Length of Stay Meetings, dates discussed:    Additional Comments: Transfusing 1 u PRBC, GI consult  Marily MemosLisa M Khady Vandenberg, RN 04/17/2015, 2:22 PM

## 2015-04-17 NOTE — Plan of Care (Signed)
Problem: Consults Goal: Nutrition Consult-if indicated Outcome: Not Progressing NPO

## 2015-04-17 NOTE — Op Note (Signed)
[facility] Gastroenterology Patient Name: [name] Procedure Date: 04/17/2015 4:00 PM MRN: [id number] Account #: [id number] Date of Birth: 06-Sep-1931 Admit Type: Inpatient Age: 81 Room: [facility] ENDO ROOM 1 Gender: Male Note Status: Finalized Procedure:         Upper GI endoscopy Indications:       Melena Providers:         [name] Jun, MD Medicines:         Propofol per Anesthesia Complications:     No immediate complications. Procedure:         Pre-Anesthesia Assessment:                    - After reviewing the risks and benefits, the patient was                     deemed in satisfactory condition to undergo the procedure.                    After obtaining informed consent, the endoscope was passed                     under direct vision. Throughout the procedure, the                     patient's blood pressure, pulse, and oxygen saturations                     were monitored continuously. The Endoscope was introduced                     through the mouth, and advanced to the duodenal second                     portion. The upper GI endoscopy was accomplished without                     difficulty. The patient tolerated the procedure well. Findings:      The examined esophagus was normal.      One non-bleeding cratered gastric ulcer with a visible vessel was found       on the posterior wall of the gastric body in the proximal stomach. The       lesion was 4-5 mm in largest dimension. A visible vessel ws seen. Area       was successfully injected with 4 mL of a 1:10,000 solution of       epinephrine for hemostasis. Coagulation for bleeding prevention using       heater probe was successful. 2 pulses of 30J 10 and 7 sec. Noo bleeding       before or after the endoscopic treatment.      The examined duodenum was normal. Impression:        - Normal esophagus.                    - Gastric ulcer containing visible vessel. Injected.   Treated with a heater probe.                    - Normal examined duodenum.                    - No specimens collected. Recommendation:    - The findings and recommendations were discussed with the  patient's family. [name] Junobert T Prisha Hiley, MD 04/17/2015 4:57:25 PM This report has been signed electronically. Number of Addenda: 0 Note Initiated On: 04/17/2015 4:00 PM      [facility]

## 2015-04-17 NOTE — Progress Notes (Signed)
[facility] - [place] at [facility]                                                                                                                                                                                            Patient Demographics   [name], is a 81 y.o. male, DOB - [birth year], ZOX:[id number]  Admit date - 04/16/2015   Admitting Physician Houston Siren, MD  Outpatient Primary MD for the patient is [name], MD  LOS - 1  Chief Complaint  Patient presents with  . GI Bleeding       Pt recently hospitalized for GI bleeding, he is on chronic Coumadin therapy for a St. Jude's valve as well as a clot. He denies any further bleeding since since yesterday. His hemoglobin is only 7. GI physician called the nurse and recommended that he be transfused.   Review of Systems:   CONSTITUTIONAL: No documented fever. No fatigue, weakness. No weight gain, no weight loss.  EYES: No blurry or double vision.  ENT: No tinnitus. No postnasal drip. No redness of the oropharynx.  RESPIRATORY: No cough, no wheeze, no hemoptysis. No dyspnea.  CARDIOVASCULAR: No chest pain. No orthopnea. No palpitations. No syncope.  GASTROINTESTINAL: No nausea, no vomiting  positive diarrhea. No abdominal pain. Positive for tarry stools GENITOURINARY: No dysuria or hematuria.  ENDOCRINE: No polyuria or nocturia. No heat or cold intolerance.  HEMATOLOGY: No anemia. No bruising. No bleeding.  INTEGUMENTARY: No rashes. No lesions.  MUSCULOSKELETAL: No arthritis. No swelling. No gout.  NEUROLOGIC: No numbness, tingling, or ataxia. No seizure-type activity.  PSYCHIATRIC: No anxiety. No insomnia. No ADD.    Vitals:   Filed Vitals:   04/17/15 0445 04/17/15 0837 04/17/15 1208 04/17/15 1250  BP: 133/56 141/48 146/54 130/55  Pulse:  88 82 82  Temp:  98.4 F (36.9 C) 99.5 F (37.5 C) 98.7 F (37.1 C)  TempSrc:  Oral Oral Oral  Resp:  19 14 16   Height:      Weight:       SpO2:  91% 91% 100%    Wt Readings from Last 3 Encounters:  04/16/15 90.2 kg (198 lb 13.7 oz)  04/02/15 86.183 kg (190 lb)  03/24/15 81.6 kg (179 lb 14.3 oz)     Intake/Output Summary (Last 24 hours) at 04/17/15 1347 Last data filed at 04/17/15 0645  Gross per 24 hour  Intake    375 ml  Output    250 ml  Net    125 ml    Physical Exam:   GENERAL: Pleasant-appearing in no apparent distress.  HEAD, EYES, EARS, NOSE AND THROAT: Atraumatic, normocephalic. Extraocular muscles are intact. Pupils equal and reactive to light. Sclerae anicteric. No conjunctival injection. No oro-pharyngeal erythema.  NECK: Supple. There is no jugular venous distention. No bruits, no lymphadenopathy, no thyromegaly.  HEART: Regular rate and rhythm, tachycardic. Mechanical valve murmurs, no rubs, no clicks.  LUNGS: Clear to auscultation bilaterally. No rales or rhonchi. No wheezes.  ABDOMEN: Soft, flat, nontender, nondistended. Has good bowel sounds. No hepatosplenomegaly appreciated.  EXTREMITIES: No evidence of any cyanosis, clubbing, or peripheral edema.  +2 pedal and radial pulses bilaterally.  NEUROLOGIC: The patient is alert, awake, and oriented x3 with no focal motor or sensory deficits appreciated bilaterally.  SKIN: Moist and warm with no rashes appreciated.  Psych: Not anxious, depressed LN: No inguinal LN enlargement    Antibiotics   Anti-infectives    Start     Dose/Rate Route Frequency Ordered Stop   04/16/15 2200  ceFAZolin (ANCEF) IVPB 2 g/50 mL premix     2 g 100 mL/hr over 30 Minutes Intravenous 3 times per day 04/16/15 2057     04/16/15 2100  metroNIDAZOLE (FLAGYL) IVPB 500 mg     500 mg 100 mL/hr over 60 Minutes Intravenous Every 8 hours 04/16/15 2057        Medications   Scheduled Meds: . sodium chloride   Intravenous Once  . amLODipine  10 mg Oral Daily  . ceFAZolin  2 g Intravenous 3 times per day  . citalopram  20 mg Oral Daily  . finasteride  5 mg Oral Daily  .  irbesartan  300 mg Oral Daily  . metronidazole  500 mg Intravenous Q8H  . multivitamin  1 tablet Oral Daily  . pantoprazole (PROTONIX) IV  40 mg Intravenous Q12H  . rOPINIRole  1 mg Oral TID  . tamsulosin  0.4 mg Oral Daily  . traZODone  50 mg Oral QHS   Continuous Infusions: . 0.9 % NaCl with KCl 20 mEq / L 75 mL/hr at 04/17/15 0030   PRN Meds:.acetaminophen **OR** acetaminophen, ALPRAZolam, cyclobenzaprine, ondansetron **OR** ondansetron (ZOFRAN) IV   Data Review:   Micro Results Recent Results (from the past 240 hour(s))  Culture, blood (routine x 2)     Status: None   Collection Time: 04/09/15  5:03 PM  Result Value Ref Range Status   Specimen Description BLOOD  Final   Special Requests Normal  Final   Culture  Setup Time   Final    GRAM POSITIVE COCCI IN BOTH AEROBIC AND ANAEROBIC BOTTLES CRITICAL RESULT CALLED TO, READ BACK BY AND VERIFIED WITH: BECKU REAP AT 0454 04/10/15. TSH IDENTIFICATION TO FOLLOW CONFIRMED BY MPG    Culture   Final    STAPHYLOCOCCUS AUREUS IN BOTH AEROBIC AND ANAEROBIC BOTTLES    Report Status 04/11/2015 FINAL  Final   Organism ID, Bacteria STAPHYLOCOCCUS AUREUS  Final      Susceptibility   Staphylococcus aureus - MIC*    CIPROFLOXACIN <=0.5 SENSITIVE Sensitive     ERYTHROMYCIN <=0.25 SENSITIVE Sensitive     GENTAMICIN <=0.5 SENSITIVE Sensitive     OXACILLIN <=0.25 SENSITIVE Sensitive     TETRACYCLINE <=1 SENSITIVE Sensitive     TRIMETH/SULFA <=10 SENSITIVE Sensitive     CLINDAMYCIN <=0.25 SENSITIVE Sensitive     CEFOXITIN SCREEN NEGATIVE Sensitive     Inducible Clindamycin NEGATIVE Sensitive     * STAPHYLOCOCCUS AUREUS  Culture, blood (routine x 2)     Status: None   Collection Time:  04/09/15  5:03 PM  Result Value Ref Range Status   Specimen Description BLOOD  Final   Special Requests Normal  Final   Culture  Setup Time   Final    GRAM POSITIVE COCCI IN BOTH AEROBIC AND ANAEROBIC BOTTLES CRITICAL RESULT CALLED TO, READ BACK BY AND  VERIFIED WITH: BECKY REAP AT [id number] 04/10/15. TSH IDENTIFICATION TO FOLLOW CONFIRMED BY MPG    Culture   Final    STAPHYLOCOCCUS AUREUS IN BOTH AEROBIC AND ANAEROBIC BOTTLES    Report Status 04/11/2015 FINAL  Final   Organism ID, Bacteria STAPHYLOCOCCUS AUREUS  Final      Susceptibility   Staphylococcus aureus - MIC*    CIPROFLOXACIN <=0.5 SENSITIVE Sensitive     ERYTHROMYCIN <=0.25 SENSITIVE Sensitive     GENTAMICIN <=0.5 SENSITIVE Sensitive     OXACILLIN <=0.25 SENSITIVE Sensitive     TETRACYCLINE <=1 SENSITIVE Sensitive     TRIMETH/SULFA <=10 SENSITIVE Sensitive     CLINDAMYCIN <=0.25 SENSITIVE Sensitive     CEFOXITIN SCREEN NEGATIVE Sensitive     Inducible Clindamycin NEGATIVE Sensitive     * STAPHYLOCOCCUS AUREUS  Urine culture     Status: None   Collection Time: 04/09/15  6:36 PM  Result Value Ref Range Status   Specimen Description URINE, CLEAN CATCH  Final   Special Requests Normal  Final   Culture NO GROWTH 2 DAYS  Final   Report Status 04/11/2015 FINAL  Final  C difficile quick scan w PCR reflex [facility])     Status: None   Collection Time: 04/10/15  2:14 PM  Result Value Ref Range Status   C Diff antigen POSITIVE  Final   C Diff toxin NEGATIVE  Final   C Diff interpretation   Final    Positive for toxigenic C. difficile, active toxin production not detected. Patient has toxigenic C. difficile organisms present in the bowel, but toxin was not detected. The patient may be a carrier or the level of toxin in the sample was below the limit  of detection. This information should be used in conjunction with the patient's clinical history when deciding on possible therapy.   Clostridium Difficile by PCR     Status: Abnormal   Collection Time: 04/10/15  2:14 PM  Result Value Ref Range Status   C difficile by pcr POSITIVE (A) NEGATIVE Final    Comment: CRITICAL RESULT CALLED TO, READ BACK BY AND VERIFIED WITH: ANNA RODRIGUEZ AT 1710 04/10/15 SDR   Culture, blood (routine x 2)      Status: None   Collection Time: 04/11/15  4:14 PM  Result Value Ref Range Status   Specimen Description BLOOD  Final   Special Requests Normal  Final   Culture NO GROWTH 5 DAYS  Final   Report Status 04/16/2015 FINAL  Final  Culture, blood (routine x 2)     Status: None   Collection Time: 04/11/15  4:14 PM  Result Value Ref Range Status   Specimen Description BLOOD  Final   Special Requests Normal  Final   Culture NO GROWTH 5 DAYS  Final   Report Status 04/16/2015 FINAL  Final  Cath Tip Culture     Status: None   Collection Time: 04/13/15 12:15 PM  Result Value Ref Range Status   Specimen Description   Final   Special Requests NONE  Final   Culture NO GROWTH 4 DAYS  Final   Report Status 04/17/2015 FINAL  Final  C difficile quick scan  w PCR reflex [facility])     Status: None   Collection Time: 04/17/15 12:20 AM  Result Value Ref Range Status   C Diff antigen NEGATIVE  Final   C Diff toxin NEGATIVE  Final   C Diff interpretation Negative for C. difficile  Final    Radiology Reports Dg Chest 2 View  04/11/2015   CLINICAL DATA:  Increase shortness of Breath  EXAM: CHEST  2 VIEW  COMPARISON:  04/09/2015  FINDINGS: Right-sided PICC line is again identified. Postsurgical changes are again seen. The cardiac shadow is stable but mildly enlarged. No focal infiltrate or sizable effusion is seen. Mild vascular congestion is noted without edema.  IMPRESSION: Mild vascular congestion without edema. No other focal abnormality is seen.   Electronically Signed   By: Alcide Clever M.D.   On: 04/11/2015 15:52   Dg Chest 2 View  04/09/2015   CLINICAL DATA:  81 year old male with fever. History of cardiac valve replacement.  EXAM: CHEST  2 VIEW  COMPARISON:  10/30/2014.  FINDINGS: Cardiomegaly cardiac valve replacement changes noted.  Patchy opacity at the medial right lung base may represent airspace disease or atelectasis.  There is no evidence of pleural effusion,, pulmonary edema, pneumothorax or  pulmonary mass.  No acute bony abnormalities are noted.  IMPRESSION: Patchy medial right lung base opacity -question atelectasis versus airspace disease/ pneumonia.  Cardiomegaly.   Electronically Signed   By: Harmon Pier M.D.   On: 04/09/2015 16:49   US Venous Img Upper Uni Left  04/12/2015   CLINICAL DATA:  Redness and swelling at previous IV site. Rule out DVT or abscess. Patient has a right arm PICC line.  EXAM: LEFT UPPER EXTREMITY VENOUS DOPPLER ULTRASOUND  TECHNIQUE: Gray-scale sonography with graded compression, as well as color Doppler and duplex ultrasound were performed to evaluate the upper extremity deep venous system from the level of the subclavian vein and including the jugular, axillary, basilic and upper cephalic vein. Spectral Doppler was utilized to evaluate flow at rest and with distal augmentation maneuvers.  COMPARISON:  None.  FINDINGS: Right upper extremity: There is a PICC line in the right subclavian vein. Color Doppler flow in the right subclavian vein with normal phasicity.  Left upper extremity: Normal compressibility, color Doppler flow and phasicity in the left internal jugular vein. Normal color Doppler flow and phasicity in the left subclavian vein. Normal compressibility of the distal left subclavian vein. Normal compressibility, color Doppler flow and augmentation in the left axillary vein. The proximal left cephalic vein is compressible has color Doppler flow. One of the brachial veins are is non compressible and has some echogenic thrombus. However, this vessel is small and could represent chronic thrombosis. The other visualized left brachial veins are patent.  There is partial compressibility of the left basilic vein in the mid upper arm which is consistent with nonocclusive thrombus. Visualized left radial veins appear to be patent. Limited evaluation of the left ulnar veins.  There is extensive edema in the left lower arm with a thrombosed superficial vein which measures  roughly 6 mm. There is no flow in this thrombosed superficial vein.  IMPRESSION: Positive for superficial thrombophlebitis involving a superficial vein in the left forearm. There is also nonocclusive thrombus in the left basilic vein.  There is deep vein thrombosis involving one of the left brachial veins. This thrombosed vein is small and could be chronic but age indeterminate.   Electronically Signed   By: Meriel Pica.D.  On: 04/12/2015 17:09   Dg Chest Port 1 View  04/13/2015   CLINICAL DATA:  Evaluation of PICC line placement  EXAM: PORTABLE CHEST - 1 VIEW  COMPARISON:  Chest x-ray of 04/11/2015  FINDINGS: A right-sided PICC line is present with the tip overlying the lower SVC just above the expected SVC -RA junction. There is some indistinctness of the perihilar vasculature and mild pulmonary vascular congestion cannot be excluded. No infiltrate or effusion is seen. The heart is mildly enlarged. Median sternotomy sutures are noted.  IMPRESSION: 1. Tip of PICC line overlies the lower SVC near the expected SVC -RA junction. 2. Somewhat indistinct pulmonary vascularity may indicate mild pulmonary vascular congestion.   Electronically Signed   By: Dwyane Dee M.D.   On: 04/13/2015 15:47   Dg Chest Port 1 View  04/09/2015   CLINICAL DATA:  PICC line placement  EXAM: PORTABLE CHEST - 1 VIEW  COMPARISON:  04/09/2015  FINDINGS: Sternotomy wires overlie normal cardiac silhouette. Interval placement of a right PICC line with tip in the distal SVC. Mild basilar atelectasis.  IMPRESSION: Right PICC line in appropriate position.   Electronically Signed   By: Genevive Bi M.D.   On: 04/09/2015 17:17     CBC  Recent Labs Lab 04/12/15 0528 04/14/15 1600 04/16/15 1050 04/16/15 1758 04/17/15 0600  WBC 5.7 9.8 9.4 9.0 7.2  HGB 7.5* 8.4* 6.8* 6.3* 7.0*  HCT 22.8* 26.5* 20.3* 19.7* 21.1*  PLT 231 292 333 329 304  MCV 90.1 89.5 88.7 88.4 88.4  MCH 29.5 28.4 29.5 28.4 29.3  MCHC 32.7 31.8* 33.3 32.1 33.1   RDW 15.8* 17.0* 17.1* 16.9* 16.4*  LYMPHSABS  --  1.1  --  1.4  --   MONOABS  --  1.0  --  0.8  --   EOSABS  --  0.3  --  0.2  --   BASOSABS  --  0.0  --  0.0  --     Chemistries   Recent Labs Lab 04/12/15 0528 04/13/15 0548 04/14/15 1600 04/16/15 1050 04/16/15 1758 04/17/15 0600  NA 129*  --  129* 132* 130* 133*  K 2.9* 3.0* 3.1* 3.1* 3.2* 3.2*  CL 98*  --  94* 97* 96* 99*  CO2 27  --  GLUCOSE 117*  --  120* 116* 117* 97  BUN 12  --  CREATININE 0.61  --  0.57* 0.73 0.68 0.59*  CALCIUM 7.2*  --  7.3* 7.5* 7.4* 7.2*  MG  --  1.6* 2.1  --   --   --   AST  --   --   --   --  14*  --   ALT  --   --   --   --  6*  --   ALKPHOS  --   --   --   --  61  --   BILITOT  --   --   --   --  0.2*  --    ------------------------------------------------------------------------------------------------------------------ estimated creatinine clearance is 81.8 mL/min (by C-G formula based on Cr of 0.59). ------------------------------------------------------------------------------------------------------------------ No results for input(s): HGBA1C in the last 72 hours. ------------------------------------------------------------------------------------------------------------------ No results for input(s): CHOL, HDL, LDLCALC, TRIG, CHOLHDL, LDLDIRECT in the last 72 hours. ------------------------------------------------------------------------------------------------------------------ No results for input(s): TSH, T4TOTAL, T3FREE, THYROIDAB in the last 72 hours.  Invalid input(s): FREET3 ------------------------------------------------------------------------------------------------------------------ No results for input(s): VITAMINB12, FOLATE, FERRITIN, TIBC, IRON, RETICCTPCT in the  last 72 hours.  Coagulation profile  Recent Labs Lab 04/11/15 0636 04/12/15 0528  INR 1.41 1.09    No results for input(s): DDIMER in the last 72 hours.  Cardiac Enzymes No  results for input(s): CKMB, TROPONINI, MYOGLOBIN in the last 168 hours.  Invalid input(s): CK ------------------------------------------------------------------------------------------------------------------ Invalid input(s): POCBNP    Assessment & Plan   81 year old male with past medical history of a St. Jude's aortic valve, history of recent GI bleed, history of C. difficile colitis, history of coronary disease, hypertension, hyperlipidemia, GERD, anxiety/depression, who presented to the hospital due to melanotic stools and diarrhea and noted to be anemic.  #1 upper GI bleed - likely cause of patient's anemia and melanotic stools. Patient had a recent endoscopy done by Dr. Mechele Collin which showed a hiatal hernia but no evidence of acute bleeding. Patient apparently was on Coumadin for a recent left upper extremity DVT and also due to his St. Jude's aortic valve which has been stopped for planned 2 wks.  - Transfuse 1 unit packed RBCs - Await GI input, patient may need repeat EGD and a colonoscopy if GI feels appropriate.   #2 diarrhea - patient recently had C. difficile colitis repeat C. difficile is negative Change to by mouth Flagyl, Dr. Sampson Goon consult has been placed  #3 staph aureus bacteremia - patient apparently developed a staph infection due to a infected peripheral IV and developed bacteremia subsequently. He was discharged with a PICC line on IV Ancef which I will continue for now. - ID consult is pending  #4 history of St. Jude's valve - patient's Coumadin for hold now due to GI bleed   #5 hypertension - hemodynamically stable. Continue irbesartan Norvasc   #6 restless leg syndrome continue Requip   #7 BPH continue Flomax      Code Status Orders        Start     Ordered   04/16/15 2058  Full code   Continuous     04/16/15 2057      Family Communication: Daughter  Disposition Plan: Rehabilitation       DVT Prophylaxis   SCDs   Lab Results   Component Value Date   PLT 304 04/17/2015     Time Spent in minutes     Mihcael Ledee M.D on 04/17/2015 at 1:47 PM  Between 7am to 6pm - Pager - [phone]  After 6pm go to [email or web address] - password [email or web address] [facility]  [facility]   Office  [phone]

## 2015-04-17 NOTE — Anesthesia Postprocedure Evaluation (Signed)
  Anesthesia Post-op Note  Patient: [name]  Procedure(s) Performed: Procedure(s): ESOPHAGOGASTRODUODENOSCOPY (EGD) (N/A)  Anesthesia type:General  Patient location: PACU  Post pain: Pain level controlled  Post assessment: Post-op Vital signs reviewed, Patient's Cardiovascular Status Stable, Respiratory Function Stable, Patent Airway and No signs of Nausea or vomiting  Post vital signs: Reviewed and stable  Last Vitals:  Filed Vitals:   04/17/15 1720  BP: 139/94  Pulse: 82  Temp:   Resp: 13    Level of consciousness: awake, alert  and patient cooperative  Complications: No apparent anesthesia complications

## 2015-04-18 ENCOUNTER — Other Ambulatory Visit: Payer: Self-pay | Admitting: Unknown Physician Specialty

## 2015-04-18 ENCOUNTER — Encounter: Payer: Self-pay | Admitting: Unknown Physician Specialty

## 2015-04-18 LAB — CBC
HCT: 24.4 % — ABNORMAL LOW (ref 40.0–52.0)
Hemoglobin: 8.1 g/dL — ABNORMAL LOW (ref 13.0–18.0)
MCH: 29.1 pg (ref 26.0–34.0)
MCHC: 33.1 g/dL (ref 32.0–36.0)
MCV: 88.1 fL (ref 80.0–100.0)
Platelets: 327 10*3/uL (ref 150–440)
RBC: 2.77 MIL/uL — AB (ref 4.40–5.90)
RDW: 16.7 % — ABNORMAL HIGH (ref 11.5–14.5)
WBC: 8 10*3/uL (ref 3.8–10.6)

## 2015-04-18 LAB — BASIC METABOLIC PANEL
Anion gap: 5 (ref 5–15)
BUN: 8 mg/dL (ref 6–20)
CALCIUM: 7.4 mg/dL — AB (ref 8.9–10.3)
CO2: 26 mmol/L (ref 22–32)
Chloride: 100 mmol/L — ABNORMAL LOW (ref 101–111)
Creatinine, Ser: 0.69 mg/dL (ref 0.61–1.24)
GFR calc non Af Amer: >60 mL/min (ref >60)
GLUCOSE: 102 mg/dL — AB (ref 65–99)
POTASSIUM: 4.1 mmol/L (ref 3.5–5.1)
Sodium: 131 mmol/L — ABNORMAL LOW (ref 135–145)

## 2015-04-18 MED ORDER — VANCOMYCIN 50 MG/ML ORAL SOLUTION
250.0000 mg | Freq: Four times a day (QID) | ORAL | Status: DC
  Administered 2015-04-18 – 2015-04-20 (×8): 250 mg via ORAL
  Filled 2015-04-18 (×14): qty 5

## 2015-04-18 MED ORDER — METRONIDAZOLE 500 MG PO TABS
500.0000 mg | ORAL_TABLET | Freq: Three times a day (TID) | ORAL | Status: DC
  Administered 2015-04-18: 500 mg via ORAL
  Filled 2015-04-18: qty 1

## 2015-04-18 NOTE — Progress Notes (Signed)
[facility] [place] [facility]                                                                                                                                                                                            Patient Demographics   [name], is a 81 y.o. male, DOB - 1931/11/04, KGM:[id number]  Admit date - 04/16/2015   Admitting Physician Houston Siren, MD  Outpatient Primary MD for the patient is [name], MD  LOS - 2  Chief Complaint  Patient presents with  . GI Bleeding      Patient's hemoglobin is stable today.  His EGD showed a visible gastric vessel that was cauterized.   Review of Systems:   CONSTITUTIONAL: No documented fever. No fatigue, weakness. No weight gain, no weight loss.  EYES: No blurry or double vision.  ENT: No tinnitus. No postnasal drip. No redness of the oropharynx.  RESPIRATORY: No cough, no wheeze, no hemoptysis. No dyspnea.  CARDIOVASCULAR: No chest pain. No orthopnea. No palpitations. No syncope.  GASTROINTESTINAL: No nausea, no vomiting  positive diarrhea. No abdominal pain. Positive for tarry stools GENITOURINARY: No dysuria or hematuria.  ENDOCRINE: No polyuria or nocturia. No heat or cold intolerance.  HEMATOLOGY: No anemia. No bruising. No bleeding.  INTEGUMENTARY: No rashes. No lesions.  MUSCULOSKELETAL: No arthritis. No swelling. No gout.  NEUROLOGIC: No numbness, tingling, or ataxia. No seizure-type activity.  PSYCHIATRIC: No anxiety. No insomnia. No ADD.    Vitals:   Filed Vitals:   04/17/15 2022 04/18/15 0348 04/18/15 0700 04/18/15 1522  BP: 140/53 135/57 161/64 154/66  Pulse: 83 76 81 79  Temp: 98.3 F (36.8 C) 98.1 F (36.7 C) 97.8 F (36.6 C) 98.1 F (36.7 C)  TempSrc: Oral Oral Oral Oral  Resp: 18 18 19 17   Height:      Weight:      SpO2: 96% 100% 97% 97%    Wt Readings from Last 3 Encounters:  04/16/15 90.2 kg (198 lb 13.7 oz)  04/02/15 86.183 kg (190 lb)  03/24/15  81.6 kg (179 lb 14.3 oz)     Intake/Output Summary (Last 24 hours) at 04/18/15 1535 Last data filed at 04/18/15 1428  Gross per 24 hour  Intake   1805 ml  Output   3525 ml  Net  -1720 ml    Physical Exam:   GENERAL: Pleasant-appearing in no apparent distress.  HEAD, EYES, EARS, NOSE AND THROAT: Atraumatic, normocephalic. Extraocular muscles are intact. Pupils equal and reactive to light. Sclerae anicteric. No conjunctival injection. No oro-pharyngeal erythema.  NECK: Supple. There is  no jugular venous distention. No bruits, no lymphadenopathy, no thyromegaly.  HEART: Regular rate and rhythm, tachycardic. Mechanical valve murmurs, no rubs, no clicks.  LUNGS: Clear to auscultation bilaterally. No rales or rhonchi. No wheezes.  ABDOMEN: Soft, flat, nontender, nondistended. Has good bowel sounds. No hepatosplenomegaly appreciated.  EXTREMITIES: No evidence of any cyanosis, clubbing, or peripheral edema.  +2 pedal and radial pulses bilaterally.  NEUROLOGIC: The patient is alert, awake, and oriented x3 with no focal motor or sensory deficits appreciated bilaterally.  SKIN: Moist and warm with no rashes appreciated.  Psych: Not anxious, depressed LN: No inguinal LN enlargement    Antibiotics   Anti-infectives    Start     Dose/Rate Route Frequency Ordered Stop   04/17/15 1600  piperacillin-tazobactam (ZOSYN) IVPB 3.375 g  Status:  Discontinued     3.375 g 12.5 mL/hr over 240 Minutes Intravenous  Once 04/17/15 1556 04/17/15 1809   04/16/15 2200  ceFAZolin (ANCEF) IVPB 2 g/50 mL premix     2 g 100 mL/hr over 30 Minutes Intravenous 3 times per day 04/16/15 2057     04/16/15 2100  metroNIDAZOLE (FLAGYL) IVPB 500 mg     500 mg 100 mL/hr over 60 Minutes Intravenous Every 8 hours 04/16/15 2057        Medications   Scheduled Meds: . amLODipine  10 mg Oral Daily  . ceFAZolin  2 g Intravenous 3 times per day  . citalopram  20 mg Oral Daily  . finasteride  5 mg Oral Daily  .  irbesartan  300 mg Oral Daily  . metronidazole  500 mg Intravenous Q8H  . pantoprazole (PROTONIX) IV  40 mg Intravenous Q12H  . rOPINIRole  1 mg Oral TID  . tamsulosin  0.4 mg Oral Daily  . traZODone  50 mg Oral QHS   Continuous Infusions: . sodium chloride Stopped (04/18/15 0748)  . 0.9 % NaCl with KCl 20 mEq / L 75 mL/hr at 04/17/15 2200   PRN Meds:.acetaminophen **OR** acetaminophen, ALPRAZolam, cyclobenzaprine, ondansetron **OR** ondansetron (ZOFRAN) IV   Data Review:   Micro Results Recent Results (from the past 240 hour(s))  Culture, blood (routine x 2)     Status: None   Collection Time: 04/09/15  5:03 PM  Result Value Ref Range Status   Specimen Description BLOOD  Final   Special Requests Normal  Final   Culture  Setup Time   Final    GRAM POSITIVE COCCI IN BOTH AEROBIC AND ANAEROBIC BOTTLES CRITICAL RESULT CALLED TO, READ BACK BY AND VERIFIED WITH: BECKU REAP AT 1610 04/10/15. TSH IDENTIFICATION TO FOLLOW CONFIRMED BY MPG    Culture   Final    STAPHYLOCOCCUS AUREUS IN BOTH AEROBIC AND ANAEROBIC BOTTLES    Report Status 04/11/2015 FINAL  Final   Organism ID, Bacteria STAPHYLOCOCCUS AUREUS  Final      Susceptibility   Staphylococcus aureus - MIC*    CIPROFLOXACIN <=0.5 SENSITIVE Sensitive     ERYTHROMYCIN <=0.25 SENSITIVE Sensitive     GENTAMICIN <=0.5 SENSITIVE Sensitive     OXACILLIN <=0.25 SENSITIVE Sensitive     TETRACYCLINE <=1 SENSITIVE Sensitive     TRIMETH/SULFA <=10 SENSITIVE Sensitive     CLINDAMYCIN <=0.25 SENSITIVE Sensitive     CEFOXITIN SCREEN NEGATIVE Sensitive     Inducible Clindamycin NEGATIVE Sensitive     * STAPHYLOCOCCUS AUREUS  Culture, blood (routine x 2)     Status: None   Collection Time: 04/09/15  5:03 PM  Result Value Ref  Range Status   Specimen Description BLOOD  Final   Special Requests Normal  Final   Culture  Setup Time   Final    GRAM POSITIVE COCCI IN BOTH AEROBIC AND ANAEROBIC BOTTLES CRITICAL RESULT CALLED TO, READ BACK  BY AND VERIFIED WITH: BECKY REAP AT [id number] 04/10/15. TSH IDENTIFICATION TO FOLLOW CONFIRMED BY MPG    Culture   Final    STAPHYLOCOCCUS AUREUS IN BOTH AEROBIC AND ANAEROBIC BOTTLES    Report Status 04/11/2015 FINAL  Final   Organism ID, Bacteria STAPHYLOCOCCUS AUREUS  Final      Susceptibility   Staphylococcus aureus - MIC*    CIPROFLOXACIN <=0.5 SENSITIVE Sensitive     ERYTHROMYCIN <=0.25 SENSITIVE Sensitive     GENTAMICIN <=0.5 SENSITIVE Sensitive     OXACILLIN <=0.25 SENSITIVE Sensitive     TETRACYCLINE <=1 SENSITIVE Sensitive     TRIMETH/SULFA <=10 SENSITIVE Sensitive     CLINDAMYCIN <=0.25 SENSITIVE Sensitive     CEFOXITIN SCREEN NEGATIVE Sensitive     Inducible Clindamycin NEGATIVE Sensitive     * STAPHYLOCOCCUS AUREUS  Urine culture     Status: None   Collection Time: 04/09/15  6:36 PM  Result Value Ref Range Status   Specimen Description URINE, CLEAN CATCH  Final   Special Requests Normal  Final   Culture NO GROWTH 2 DAYS  Final   Report Status 04/11/2015 FINAL  Final  C difficile quick scan w PCR reflex [facility])     Status: None   Collection Time: 04/10/15  2:14 PM  Result Value Ref Range Status   C Diff antigen POSITIVE  Final   C Diff toxin NEGATIVE  Final   C Diff interpretation   Final    Positive for toxigenic C. difficile, active toxin production not detected. Patient has toxigenic C. difficile organisms present in the bowel, but toxin was not detected. The patient may be a carrier or the level of toxin in the sample was below the limit  of detection. This information should be used in conjunction with the patient's clinical history when deciding on possible therapy.   Clostridium Difficile by PCR     Status: Abnormal   Collection Time: 04/10/15  2:14 PM  Result Value Ref Range Status   C difficile by pcr POSITIVE (A) NEGATIVE Final    Comment: CRITICAL RESULT CALLED TO, READ BACK BY AND VERIFIED WITH: ANNA RODRIGUEZ AT 1710 04/10/15 SDR   Culture, blood (routine  x 2)     Status: None   Collection Time: 04/11/15  4:14 PM  Result Value Ref Range Status   Specimen Description BLOOD  Final   Special Requests Normal  Final   Culture NO GROWTH 5 DAYS  Final   Report Status 04/16/2015 FINAL  Final  Culture, blood (routine x 2)     Status: None   Collection Time: 04/11/15  4:14 PM  Result Value Ref Range Status   Specimen Description BLOOD  Final   Special Requests Normal  Final   Culture NO GROWTH 5 DAYS  Final   Report Status 04/16/2015 FINAL  Final  Cath Tip Culture     Status: None   Collection Time: 04/13/15 12:15 PM  Result Value Ref Range Status   Specimen Description   Final   Special Requests NONE  Final   Culture NO GROWTH 4 DAYS  Final   Report Status 04/17/2015 FINAL  Final  C difficile quick scan w PCR reflex [facility])  Status: None   Collection Time: 04/17/15 12:20 AM  Result Value Ref Range Status   C Diff antigen NEGATIVE  Final   C Diff toxin NEGATIVE  Final   C Diff interpretation Negative for C. difficile  Final    Radiology Reports Dg Chest 2 View  04/11/2015   CLINICAL DATA:  Increase shortness of Breath  EXAM: CHEST  2 VIEW  COMPARISON:  04/09/2015  FINDINGS: Right-sided PICC line is again identified. Postsurgical changes are again seen. The cardiac shadow is stable but mildly enlarged. No focal infiltrate or sizable effusion is seen. Mild vascular congestion is noted without edema.  IMPRESSION: Mild vascular congestion without edema. No other focal abnormality is seen.   Electronically Signed   By: Alcide Clever M.D.   On: 04/11/2015 15:52   Dg Chest 2 View  04/09/2015   CLINICAL DATA:  81 year old male with fever. History of cardiac valve replacement.  EXAM: CHEST  2 VIEW  COMPARISON:  10/30/2014.  FINDINGS: Cardiomegaly cardiac valve replacement changes noted.  Patchy opacity at the medial right lung base may represent airspace disease or atelectasis.  There is no evidence of pleural effusion,, pulmonary edema, pneumothorax  or pulmonary mass.  No acute bony abnormalities are noted.  IMPRESSION: Patchy medial right lung base opacity -question atelectasis versus airspace disease/ pneumonia.  Cardiomegaly.   Electronically Signed   By: Harmon Pier M.D.   On: 04/09/2015 16:49   US Venous Img Upper Uni Left  04/12/2015   CLINICAL DATA:  Redness and swelling at previous IV site. Rule out DVT or abscess. Patient has a right arm PICC line.  EXAM: LEFT UPPER EXTREMITY VENOUS DOPPLER ULTRASOUND  TECHNIQUE: Gray-scale sonography with graded compression, as well as color Doppler and duplex ultrasound were performed to evaluate the upper extremity deep venous system from the level of the subclavian vein and including the jugular, axillary, basilic and upper cephalic vein. Spectral Doppler was utilized to evaluate flow at rest and with distal augmentation maneuvers.  COMPARISON:  None.  FINDINGS: Right upper extremity: There is a PICC line in the right subclavian vein. Color Doppler flow in the right subclavian vein with normal phasicity.  Left upper extremity: Normal compressibility, color Doppler flow and phasicity in the left internal jugular vein. Normal color Doppler flow and phasicity in the left subclavian vein. Normal compressibility of the distal left subclavian vein. Normal compressibility, color Doppler flow and augmentation in the left axillary vein. The proximal left cephalic vein is compressible has color Doppler flow. One of the brachial veins are is non compressible and has some echogenic thrombus. However, this vessel is small and could represent chronic thrombosis. The other visualized left brachial veins are patent.  There is partial compressibility of the left basilic vein in the mid upper arm which is consistent with nonocclusive thrombus. Visualized left radial veins appear to be patent. Limited evaluation of the left ulnar veins.  There is extensive edema in the left lower arm with a thrombosed superficial vein which  measures roughly 6 mm. There is no flow in this thrombosed superficial vein.  IMPRESSION: Positive for superficial thrombophlebitis involving a superficial vein in the left forearm. There is also nonocclusive thrombus in the left basilic vein.  There is deep vein thrombosis involving one of the left brachial veins. This thrombosed vein is small and could be chronic but age indeterminate.   Electronically Signed   By: Richarda Overlie M.D.   On: 04/12/2015 17:09   Dg Chest  Port 1 View  04/13/2015   CLINICAL DATA:  Evaluation of PICC line placement  EXAM: PORTABLE CHEST - 1 VIEW  COMPARISON:  Chest x-ray of 04/11/2015  FINDINGS: A right-sided PICC line is present with the tip overlying the lower SVC just above the expected SVC -RA junction. There is some indistinctness of the perihilar vasculature and mild pulmonary vascular congestion cannot be excluded. No infiltrate or effusion is seen. The heart is mildly enlarged. Median sternotomy sutures are noted.  IMPRESSION: 1. Tip of PICC line overlies the lower SVC near the expected SVC -RA junction. 2. Somewhat indistinct pulmonary vascularity may indicate mild pulmonary vascular congestion.   Electronically Signed   By: Dwyane Dee M.D.   On: 04/13/2015 15:47   Dg Chest Port 1 View  04/09/2015   CLINICAL DATA:  PICC line placement  EXAM: PORTABLE CHEST - 1 VIEW  COMPARISON:  04/09/2015  FINDINGS: Sternotomy wires overlie normal cardiac silhouette. Interval placement of a right PICC line with tip in the distal SVC. Mild basilar atelectasis.  IMPRESSION: Right PICC line in appropriate position.   Electronically Signed   By: Genevive Bi M.D.   On: 04/09/2015 17:17     CBC  Recent Labs Lab 04/14/15 1600 04/16/15 1050 04/16/15 1758 04/17/15 0600 04/18/15 0600  WBC 9.8 9.4 9.0 7.2 8.0  HGB 8.4* 6.8* 6.3* 7.0* 8.1*  HCT 26.5* 20.3* 19.7* 21.1* 24.4*  PLT 292 333 329 304 327  MCV 89.5 88.7 88.4 88.4 88.1  MCH 28.4 29.5 28.4 29.3 29.1  MCHC 31.8* 33.3 32.1  33.1 33.1  RDW 17.0* 17.1* 16.9* 16.4* 16.7*  LYMPHSABS 1.1  --  1.4  --   --   MONOABS 1.0  --  0.8  --   --   EOSABS 0.3  --  0.2  --   --   BASOSABS 0.0  --  0.0  --   --     Chemistries   Recent Labs Lab 04/13/15 0548 04/14/15 1600 04/16/15 1050 04/16/15 1758 04/17/15 0600 04/18/15 0600  NA  --  129* 132* 130* 133* 131*  K 3.0* 3.1* 3.1* 3.2* 3.2* 4.1  CL  --  94* 97* 96* 99* 100*  CO2  --  GLUCOSE  --  120* 116* 117* 97 102*  BUN  --  CREATININE  --  0.57* 0.73 0.68 0.59* 0.69  CALCIUM  --  7.3* 7.5* 7.4* 7.2* 7.4*  MG 1.6* 2.1  --   --  2.0  --   AST  --   --   --  14*  --   --   ALT  --   --   --  6*  --   --   ALKPHOS  --   --   --  61  --   --   BILITOT  --   --   --  0.2*  --   --    ------------------------------------------------------------------------------------------------------------------ estimated creatinine clearance is 81.8 mL/min (by C-G formula based on Cr of 0.69). ------------------------------------------------------------------------------------------------------------------ No results for input(s): HGBA1C in the last 72 hours. ------------------------------------------------------------------------------------------------------------------ No results for input(s): CHOL, HDL, LDLCALC, TRIG, CHOLHDL, LDLDIRECT in the last 72 hours. ------------------------------------------------------------------------------------------------------------------ No results for input(s): TSH, T4TOTAL, T3FREE, THYROIDAB in the last 72 hours.  Invalid input(s): FREET3 ------------------------------------------------------------------------------------------------------------------ No results for input(s): VITAMINB12, FOLATE, FERRITIN, TIBC, IRON, RETICCTPCT in the last 72 hours.  Coagulation profile  Recent Labs  Lab 04/12/15 0528  INR 1.09    No results for input(s): DDIMER in the last 72 hours.  Cardiac Enzymes No results for  input(s): CKMB, TROPONINI, MYOGLOBIN in the last 168 hours.  Invalid input(s): CK ------------------------------------------------------------------------------------------------------------------ Invalid input(s): POCBNP    Assessment & Plan   81 year old male with past medical history of a St. Jude's aortic valve, history of recent GI bleed, history of C. difficile colitis, history of coronary disease, hypertension, hyperlipidemia, GERD, anxiety/depression, who presented to the hospital due to melanotic stools and diarrhea and noted to be anemic.  #1 upper GI bleed - likely cause of patient's anemia and melanotic stools. Patient had a recent endoscopy done by Dr. Mechele Collin which showed a hiatal hernia but no evidence of acute bleeding. Patient apparently was on Coumadin for a recent left upper extremity DVT and also due to his St. Jude's aortic valve which has been stopped for planned 2 wks.  -Hemoglobin stable, Repeat EGD with gastric a visible vessel that was cauterized.   #2 diarrhea - patient recently had C. difficile colitis repeat C. difficile is negativeOral Flagyl    #3 staph aureus bacteremia - patient apparently developed a staph infection due to a infected peripheral IV and developed bacteremia subsequently. He was discharged with a PICC line on IV Ancef which I will continue for now. - ID consult is pending  #4 history of St. Jude's valve - patient's Coumadin for hold now due to GI bleed   #5 hypertension - hemodynamically stable. Continue irbesartan Norvasc   #6 restless leg syndrome continue Requip   #7 BPH continue Flomax      Code Status Orders        Start     Ordered   04/16/15 2058  Full code   Continuous     04/16/15 2057       Disposition Plan: Rehabilitation       DVT Prophylaxis   SCDs   Lab Results  Component Value Date   PLT 327 04/18/2015     Time Spent in minutes     Auburn Bilberry M.D on 04/18/2015 at 3:35 PM  Between  7am to 6pm - Pager - [phone]  After 6pm go to [email or web address] - password [email or web address] [facility]  [facility]   Office  [phone]

## 2015-04-18 NOTE — Clinical Documentation Improvement (Signed)
04/18/15  Dear Dr. Allena KatzPatel [facility]  Presents with melena and anemia. EGD reveals one non bleeding cratered gastric ulcerwith a visible vessel.   H&H on admission was 6.8 and 20.3  Transfused 1 unit PRBC's  Repeat draws 7.0/21 and 8.1/ 24   Please provide a diagnosis associated clinical indicators and treatment provided and document findings in next progress note and include in discharge summary if applicable.   Acute Blood Loss Anemia  Acute on chronic blood loss anemia  Chronic blood loss anemia  Precipitous drop in Hematocrit  Other Condition________________  Cannot Clinically Determine  Thank You, Shellee MiloEileen T Georgean Spainhower ,RN Clinical Documentation Specialist:  [phone]  [facility]- Health Information Management

## 2015-04-18 NOTE — Plan of Care (Signed)
Problem: Phase I Progression Outcomes Goal: Voiding-avoid urinary catheter unless indicated Outcome: Progressing Pt voids in urinal. Clear, no odor  Problem: Discharge Progression Outcomes Goal: Tolerating diet Outcome: Progressing No complaints

## 2015-04-18 NOTE — Progress Notes (Signed)
Per RN patient is negative for C-Diff. Plan [facility] once medically stable. Clinical Social Worker (CSW) will continue to follow and assist as needed.   Jetta LoutBailey Morgan, LCSWA [phone]

## 2015-04-18 NOTE — Consult Note (Signed)
VSS afebrile, hgb stable at 8.  No reported bleeding.  He would like to advance diet. Will advance to full liquids tonight.  He can restart his coumadin 2 weeks after yesterday which will be June 6th. Start at usual dose and have level checked when appropriate.  I will start his vancomycin back and stop the IV flagyl.  He could likely go back to skilled nursing Thursday 5/26th.

## 2015-04-18 NOTE — Progress Notes (Signed)
Pt. Alert and oriented. VSS. Pt. C/o headache controlled with tylenol. Using urinal. Pt. Receiving IV antibiotics. Rested quietly throughout the night.

## 2015-04-18 NOTE — Progress Notes (Signed)
Pt alert and orient x4. Uses urinal at bedside. Dressing to hand intact. Contact precautions maintained for MRSA. Pt tolerating clear liquid diet, Hgb increased t0 8.1. Pt had no compliants. No evidence of bleeding noted. Will continue to assess and monitor for safety.

## 2015-04-19 LAB — CBC
HEMATOCRIT: 28.1 % — AB (ref 40.0–52.0)
HEMOGLOBIN: 9.3 g/dL — AB (ref 13.0–18.0)
MCH: 29 pg (ref 26.0–34.0)
MCHC: 33.1 g/dL (ref 32.0–36.0)
MCV: 87.8 fL (ref 80.0–100.0)
Platelets: 399 10*3/uL (ref 150–440)
RBC: 3.2 MIL/uL — AB (ref 4.40–5.90)
RDW: 16.7 % — ABNORMAL HIGH (ref 11.5–14.5)
WBC: 10.9 10*3/uL — ABNORMAL HIGH (ref 3.8–10.6)

## 2015-04-19 LAB — BASIC METABOLIC PANEL
ANION GAP: 5 (ref 5–15)
BUN: <5 mg/dL — ABNORMAL LOW (ref 6–20)
CALCIUM: 7.9 mg/dL — AB (ref 8.9–10.3)
CO2: 27 mmol/L (ref 22–32)
CREATININE: 0.6 mg/dL — AB (ref 0.61–1.24)
Chloride: 99 mmol/L — ABNORMAL LOW (ref 101–111)
Glucose, Bld: 101 mg/dL — ABNORMAL HIGH (ref 65–99)
Potassium: 4 mmol/L (ref 3.5–5.1)
Sodium: 131 mmol/L — ABNORMAL LOW (ref 135–145)

## 2015-04-19 NOTE — Consult Note (Signed)
Pt had some darkish stool but hgb up to 9.  No vomiting. VSS afeb  REcommend:  Continue full liquid diet with mashed potatoes and scrambled eggs for 2 days more, then can go to pureed diet for 2-3 days then mechanical soft diet after that.  Restart coumadin June 6th.  [facility]  Check cbc in a week.  PPI bid for indefinite future.  Vancomycin for one more week.  If diarrhea returns after the vancomycin is stopped and stool positive for C. Diff then resume the vancomycin and take it long enough to cover for two weeks after the Ancef is finished.   I will see in June somewhere 7-9th.

## 2015-04-19 NOTE — Progress Notes (Signed)
Per MD note patient will likely be ready for D/C tomorrow 04/20/15. Clinical Child psychotherapistocial Worker (CSW) made [facility] aware of above. CSW will continue to follow and assist as needed.   Jetta LoutBailey Morgan, LCSWA [phone]

## 2015-04-19 NOTE — Progress Notes (Signed)
[facility] - [place] at [facility]                                                                                                                                                                                            Patient Demographics   [name], is a 81 y.o. male, DOB - 1931-04-03, WUJ:[id number]  Admit date - 04/16/2015   Admitting Physician [name] Siren, MD  Outpatient Primary MD for the patient is [name] Bihari, MD  LOS - 3  Chief Complaint  Patient presents with  . GI Bleeding      Patient feels okay denies any complaints, hemoglobin is staying stable  Review of Systems:   CONSTITUTIONAL: No documented fever. No fatigue, weakness. No weight gain, no weight loss.  EYES: No blurry or double vision.  ENT: No tinnitus. No postnasal drip. No redness of the oropharynx.  RESPIRATORY: No cough, no wheeze, no hemoptysis. No dyspnea.  CARDIOVASCULAR: No chest pain. No orthopnea. No palpitations. No syncope.  GASTROINTESTINAL: No nausea, no vomiting  positive diarrhea. No abdominal pain. Positive for tarry stools GENITOURINARY: No dysuria or hematuria.  ENDOCRINE: No polyuria or nocturia. No heat or cold intolerance.  HEMATOLOGY: No anemia. No bruising. No bleeding.  INTEGUMENTARY: No rashes. No lesions.  MUSCULOSKELETAL: No arthritis. No swelling. No gout.  NEUROLOGIC: No numbness, tingling, or ataxia. No seizure-type activity.  PSYCHIATRIC: No anxiety. No insomnia. No ADD.    Vitals:   Filed Vitals:   04/18/15 1522 04/18/15 2126 04/19/15 0337 04/19/15 0750  BP: 154/66 137/61 170/63 147/90  Pulse: 79 78 80 91  Temp: 98.1 F (36.7 C) 98 F (36.7 C) 97.9 F (36.6 C) 97.8 F (36.6 C)  TempSrc: Oral Oral Oral Oral  Resp: Height:      Weight:      SpO2: 97% 98% 100% 100%    Wt Readings from Last 3 Encounters:  04/16/15 90.2 kg (198 lb 13.7 oz)  04/02/15 86.183 kg (190 lb)  03/24/15 81.6 kg (179 lb 14.3 oz)      Intake/Output Summary (Last 24 hours) at 04/19/15 1223 Last data filed at 04/19/15 1220  Gross per 24 hour  Intake   1964 ml  Output   5250 ml  Net  -3286 ml    Physical Exam:   GENERAL: Pleasant-appearing in no apparent distress.  HEAD, EYES, EARS, NOSE AND THROAT: Atraumatic, normocephalic. Extraocular muscles are intact. Pupils equal and reactive to light. Sclerae anicteric. No conjunctival injection. No oro-pharyngeal erythema.  NECK: Supple. There is no jugular venous distention. No bruits, no  lymphadenopathy, no thyromegaly.  HEART: Regular rate and rhythm, tachycardic. Mechanical valve murmurs, no rubs, no clicks.  LUNGS: Clear to auscultation bilaterally. No rales or rhonchi. No wheezes.  ABDOMEN: Soft, flat, nontender, nondistended. Has good bowel sounds. No hepatosplenomegaly appreciated.  EXTREMITIES: No evidence of any cyanosis, clubbing, or peripheral edema.  +2 pedal and radial pulses bilaterally.  NEUROLOGIC: The patient is alert, awake, and oriented x3 with no focal motor or sensory deficits appreciated bilaterally.  SKIN: Moist and warm with no rashes appreciated.  Psych: Not anxious, depressed LN: No inguinal LN enlargement    Antibiotics   Anti-infectives    Start     Dose/Rate Route Frequency Ordered Stop   04/18/15 1800  vancomycin (VANCOCIN) 50 mg/mL oral solution 250 mg     250 mg Oral 4 times per day 04/18/15 1650     04/18/15 1545  metroNIDAZOLE (FLAGYL) tablet 500 mg  Status:  Discontinued     500 mg Oral 3 times per day 04/18/15 1540 04/18/15 1651   04/17/15 1600  piperacillin-tazobactam (ZOSYN) IVPB 3.375 g  Status:  Discontinued     3.375 g 12.5 mL/hr over 240 Minutes Intravenous  Once 04/17/15 1556 04/17/15 1809   04/16/15 2200  ceFAZolin (ANCEF) IVPB 2 g/50 mL premix     2 g 100 mL/hr over 30 Minutes Intravenous 3 times per day 04/16/15 2057     04/16/15 2100  metroNIDAZOLE (FLAGYL) IVPB 500 mg  Status:  Discontinued     500 mg 100 mL/hr  over 60 Minutes Intravenous Every 8 hours 04/16/15 2057 04/18/15 1540      Medications   Scheduled Meds: . amLODipine  10 mg Oral Daily  . ceFAZolin  2 g Intravenous 3 times per day  . citalopram  20 mg Oral Daily  . finasteride  5 mg Oral Daily  . irbesartan  300 mg Oral Daily  . pantoprazole (PROTONIX) IV  40 mg Intravenous Q12H  . rOPINIRole  1 mg Oral TID  . tamsulosin  0.4 mg Oral Daily  . traZODone  50 mg Oral QHS  . vancomycin  250 mg Oral 4 times per day   Continuous Infusions: . sodium chloride Stopped (04/18/15 0748)  . 0.9 % NaCl with KCl 20 mEq / L 75 mL/hr at 04/19/15 0528   PRN Meds:.acetaminophen **OR** acetaminophen, ALPRAZolam, cyclobenzaprine, ondansetron **OR** ondansetron (ZOFRAN) IV   Data Review:   Micro Results Recent Results (from the past 240 hour(s))  Culture, blood (routine x 2)     Status: None   Collection Time: 04/09/15  5:03 PM  Result Value Ref Range Status   Specimen Description BLOOD  Final   Special Requests Normal  Final   Culture  Setup Time   Final    GRAM POSITIVE COCCI IN BOTH AEROBIC AND ANAEROBIC BOTTLES CRITICAL RESULT CALLED TO, READ BACK BY AND VERIFIED WITH: BECKU REAP AT 1610 04/10/15. TSH IDENTIFICATION TO FOLLOW CONFIRMED BY [facility]PG    Culture   Final    STAPHYLOCOCCUS AUREUS IN BOTH AEROBIC AND ANAEROBIC BOTTLES    Report Status 04/11/2015 FINAL  Final   Organism ID, Bacteria STAPHYLOCOCCUS AUREUS  Final      Susceptibility   Staphylococcus aureus - MIC*    CIPROFLOXACIN <=0.5 SENSITIVE Sensitive     ERYTHROMYCIN <=0.25 SENSITIVE Sensitive     GENTAMICIN <=0.5 SENSITIVE Sensitive     OXACILLIN <=0.25 SENSITIVE Sensitive     TETRACYCLINE <=1 SENSITIVE Sensitive     TRIMETH/SULFA <=10  SENSITIVE Sensitive     CLINDAMYCIN <=0.25 SENSITIVE Sensitive     CEFOXITIN SCREEN NEGATIVE Sensitive     Inducible Clindamycin NEGATIVE Sensitive     * STAPHYLOCOCCUS AUREUS  Culture, blood (routine x 2)     Status: None    Collection Time: 04/09/15  5:03 PM  Result Value Ref Range Status   Specimen Description BLOOD  Final   Special Requests Normal  Final   Culture  Setup Time   Final    GRAM POSITIVE COCCI IN BOTH AEROBIC AND ANAEROBIC BOTTLES CRITICAL RESULT CALLED TO, READ BACK BY AND VERIFIED WITH: BECKY REAP AT 0865 04/10/15. TSH IDENTIFICATION TO FOLLOW CONFIRMED BY [facility]PG    Culture   Final    STAPHYLOCOCCUS AUREUS IN BOTH AEROBIC AND ANAEROBIC BOTTLES    Report Status 04/11/2015 FINAL  Final   Organism ID, Bacteria STAPHYLOCOCCUS AUREUS  Final      Susceptibility   Staphylococcus aureus - MIC*    CIPROFLOXACIN <=0.5 SENSITIVE Sensitive     ERYTHROMYCIN <=0.25 SENSITIVE Sensitive     GENTAMICIN <=0.5 SENSITIVE Sensitive     OXACILLIN <=0.25 SENSITIVE Sensitive     TETRACYCLINE <=1 SENSITIVE Sensitive     TRIMETH/SULFA <=10 SENSITIVE Sensitive     CLINDAMYCIN <=0.25 SENSITIVE Sensitive     CEFOXITIN SCREEN NEGATIVE Sensitive     Inducible Clindamycin NEGATIVE Sensitive     * STAPHYLOCOCCUS AUREUS  Urine culture     Status: None   Collection Time: 04/09/15  6:36 PM  Result Value Ref Range Status   Specimen Description URINE, CLEAN CATCH  Final   Special Requests Normal  Final   Culture NO GROWTH 2 DAYS  Final   Report Status 04/11/2015 FINAL  Final  C difficile quick scan w PCR reflex [facility])     Status: None   Collection Time: 04/10/15  2:14 PM  Result Value Ref Range Status   C Diff antigen POSITIVE  Final   C Diff toxin NEGATIVE  Final   C Diff interpretation   Final    Positive for toxigenic C. difficile, active toxin production not detected. Patient has toxigenic C. difficile organisms present in the bowel, but toxin was not detected. The patient may be a carrier or the level of toxin in the sample was below the limit  of detection. This information should be used in conjunction with the patient's clinical history when deciding on possible therapy.   Clostridium Difficile by PCR      Status: Abnormal   Collection Time: 04/10/15  2:14 PM  Result Value Ref Range Status   C difficile by pcr POSITIVE (A) NEGATIVE Final    Comment: CRITICAL RESULT CALLED TO, READ BACK BY AND VERIFIED WITH: ANNA RODRIGUEZ AT 1710 04/10/15 SDR   Culture, blood (routine x 2)     Status: None   Collection Time: 04/11/15  4:14 PM  Result Value Ref Range Status   Specimen Description BLOOD  Final   Special Requests Normal  Final   Culture NO GROWTH 5 DAYS  Final   Report Status 04/16/2015 FINAL  Final  Culture, blood (routine x 2)     Status: None   Collection Time: 04/11/15  4:14 PM  Result Value Ref Range Status   Specimen Description BLOOD  Final   Special Requests Normal  Final   Culture NO GROWTH 5 DAYS  Final   Report Status 04/16/2015 FINAL  Final  Cath Tip Culture     Status: None  Collection Time: 04/13/15 12:15 PM  Result Value Ref Range Status   Specimen Description   Final   Special Requests NONE  Final   Culture NO GROWTH 4 DAYS  Final   Report Status 04/17/2015 FINAL  Final  C difficile quick scan w PCR reflex [facility]     Status: None   Collection Time: 04/17/15 12:20 AM  Result Value Ref Range Status   C Diff antigen NEGATIVE  Final   C Diff toxin NEGATIVE  Final   C Diff interpretation Negative for C. difficile  Final    Radiology Reports Dg Chest 2 View  04/11/2015   CLINICAL DATA:  Increase shortness of Breath  EXAM: CHEST  2 VIEW  COMPARISON:  04/09/2015  FINDINGS: Right-sided PICC line is again identified. Postsurgical changes are again seen. The cardiac shadow is stable but mildly enlarged. No focal infiltrate or sizable effusion is seen. Mild vascular congestion is noted without edema.  IMPRESSION: Mild vascular congestion without edema. No other focal abnormality is seen.   Electronically Signed   By: Alcide CleverMark  Lukens M.D.   On: 04/11/2015 15:52   Dg Chest 2 View  04/09/2015   CLINICAL DATA:  81 year old male with fever. History of cardiac valve replacement.  EXAM:  CHEST  2 VIEW  COMPARISON:  10/30/2014.  FINDINGS: Cardiomegaly cardiac valve replacement changes noted.  Patchy opacity at the medial right lung base may represent airspace disease or atelectasis.  There is no evidence of pleural effusion,, pulmonary edema, pneumothorax or pulmonary mass.  No acute bony abnormalities are noted.  IMPRESSION: Patchy medial right lung base opacity -question atelectasis versus airspace disease/ pneumonia.  Cardiomegaly.   Electronically Signed   By: Harmon PierJeffrey  Hu M.D.   On: 04/09/2015 16:49   [place] Venous Img Upper Uni Left  04/12/2015   CLINICAL DATA:  Redness and swelling at previous IV site. Rule out DVT or abscess. Patient has a right arm PICC line.  EXAM: LEFT UPPER EXTREMITY VENOUS DOPPLER ULTRASOUND  TECHNIQUE: Gray-scale sonography with graded compression, as well as color Doppler and duplex ultrasound were performed to evaluate the upper extremity deep venous system from the level of the subclavian vein and including the jugular, axillary, basilic and upper cephalic vein. Spectral Doppler was utilized to evaluate flow at rest and with distal augmentation maneuvers.  COMPARISON:  None.  FINDINGS: Right upper extremity: There is a PICC line in the right subclavian vein. Color Doppler flow in the right subclavian vein with normal phasicity.  Left upper extremity: Normal compressibility, color Doppler flow and phasicity in the left internal jugular vein. Normal color Doppler flow and phasicity in the left subclavian vein. Normal compressibility of the distal left subclavian vein. Normal compressibility, color Doppler flow and augmentation in the left axillary vein. The proximal left cephalic vein is compressible has color Doppler flow. One of the brachial veins are is non compressible and has some echogenic thrombus. However, this vessel is small and could represent chronic thrombosis. The other visualized left brachial veins are patent.  There is partial compressibility of the left  basilic vein in the mid upper arm which is consistent with nonocclusive thrombus. Visualized left radial veins appear to be patent. Limited evaluation of the left ulnar veins.  There is extensive edema in the left lower arm with a thrombosed superficial vein which measures roughly 6 mm. There is no flow in this thrombosed superficial vein.  IMPRESSION: Positive for superficial thrombophlebitis involving a superficial vein in the left forearm.  There is also nonocclusive thrombus in the left basilic vein.  There is deep vein thrombosis involving one of the left brachial veins. This thrombosed vein is small and could be chronic but age indeterminate.   Electronically Signed   By: Richarda Overlie M.D.   On: 04/12/2015 17:09   Dg Chest Port 1 View  04/13/2015   CLINICAL DATA:  Evaluation of PICC line placement  EXAM: PORTABLE CHEST - 1 VIEW  COMPARISON:  Chest x-ray of 04/11/2015  FINDINGS: A right-sided PICC line is present with the tip overlying the lower SVC just above the expected SVC -RA junction. There is some indistinctness of the perihilar vasculature and mild pulmonary vascular congestion cannot be excluded. No infiltrate or effusion is seen. The heart is mildly enlarged. Median sternotomy sutures are noted.  IMPRESSION: 1. Tip of PICC line overlies the lower SVC near the expected SVC -RA junction. 2. Somewhat indistinct pulmonary vascularity may indicate mild pulmonary vascular congestion.   Electronically Signed   By: Dwyane Dee M.D.   On: 04/13/2015 15:47   Dg Chest Port 1 View  04/09/2015   CLINICAL DATA:  PICC line placement  EXAM: PORTABLE CHEST - 1 VIEW  COMPARISON:  04/09/2015  FINDINGS: Sternotomy wires overlie normal cardiac silhouette. Interval placement of a right PICC line with tip in the distal SVC. Mild basilar atelectasis.  IMPRESSION: Right PICC line in appropriate position.   Electronically Signed   By: Genevive Bi M.D.   On: 04/09/2015 17:17     CBC  Recent Labs Lab 04/14/15 1600  04/16/15 1050 04/16/15 1758 04/17/15 0600 04/18/15 0600 04/19/15 0425  WBC 9.8 9.4 9.0 7.2 8.0 10.9*  HGB 8.4* 6.8* 6.3* 7.0* 8.1* 9.3*  HCT 26.5* 20.3* 19.7* 21.1* 24.4* 28.1*  PLT 292 333 329 304 327 399  MCV 89.5 88.7 88.4 88.4 88.1 87.8  MCH 28.4 29.5 28.4 29.3 29.1 29.0  MCHC 31.8* 33.3 32.1 33.1 33.1 33.1  RDW 17.0* 17.1* 16.9* 16.4* 16.7* 16.7*  LYMPHSABS 1.1  --  1.4  --   --   --   MONOABS 1.0  --  0.8  --   --   --   EOSABS 0.3  --  0.2  --   --   --   BASOSABS 0.0  --  0.0  --   --   --     Chemistries   Recent Labs Lab 04/13/15 0548  04/14/15 1600 04/16/15 1050 04/16/15 1758 04/17/15 0600 04/18/15 0600 04/19/15 0425  NA  --   < > 129* 132* 130* 133* 131* 131*  K 3.0*  --  3.1* 3.1* 3.2* 3.2* 4.1 4.0  CL  --   < > 94* 97* 96* 99* 100* 99*  CO2  --   < > GLUCOSE  --   < > 120* 116* 117* 97 102* 101*  BUN  --   < > <5*  CREATININE  --   < > 0.57* 0.73 0.68 0.59* 0.69 0.60*  CALCIUM  --   < > 7.3* 7.5* 7.4* 7.2* 7.4* 7.9*  MG 1.6*  --  2.1  --   --  2.0  --   --   AST  --   --   --   --  14*  --   --   --   ALT  --   --   --   --  6*  --   --   --   ALKPHOS  --   --   --   --  61  --   --   --   BILITOT  --   --   --   --  0.2*  --   --   --   < > = values in this interval not displayed. ------------------------------------------------------------------------------------------------------------------ estimated creatinine clearance is 81.8 mL/min (by C-G formula based on Cr of 0.6). ------------------------------------------------------------------------------------------------------------------ No results for input(s): HGBA1C in the last 72 hours. ------------------------------------------------------------------------------------------------------------------ No results for input(s): CHOL, HDL, LDLCALC, TRIG, CHOLHDL, LDLDIRECT in the last 72  hours. ------------------------------------------------------------------------------------------------------------------ No results for input(s): TSH, T4TOTAL, T3FREE, THYROIDAB in the last 72 hours.  Invalid input(s): FREET3 ------------------------------------------------------------------------------------------------------------------ No results for input(s): VITAMINB12, FOLATE, FERRITIN, TIBC, IRON, RETICCTPCT in the last 72 hours.  Coagulation profile No results for input(s): INR, PROTIME in the last 168 hours.  No results for input(s): DDIMER in the last 72 hours.  Cardiac Enzymes No results for input(s): CKMB, TROPONINI, MYOGLOBIN in the last 168 hours.  Invalid input(s): CK ------------------------------------------------------------------------------------------------------------------ Invalid input(s): POCBNP    Assessment & Plan   81 year old male with past medical history of a St. Jude's aortic valve, history of recent GI bleed, history of C. difficile colitis, history of coronary disease, hypertension, hyperlipidemia, GERD, anxiety/depression, who presented to the hospital due to melanotic stools and diarrhea and noted to be anemic.  #1 upper GI bleed - likely cause of patient's anemia and melanotic stools. Patient had a recent endoscopy done by Dr. Mechele Collin which showed a hiatal hernia but no evidence of acute bleeding. Patient apparently was on Coumadin for a recent left upper extremity DVT and also due to his St. Jude's aortic valve which has been stopped for planned 2 wks.  -Hemoglobin stable, Repeat EGD with gastric a visible vessel that was cauterized.   #2 diarrhea - patient recently had C. difficile colitis repeat C. difficile is negative on by mouth vancomycin   #3 staph aureus bacteremia - patient apparently developed a staph infection due to a infected peripheral IV and developed bacteremia subsequently. He was discharged with a PICC line on IV Ancef which I  will continue for now.  #4 history of St. Jude's valve - patient's Coumadin for hold now due to GI bleed   #5 hypertension - hemodynamically stable. Continue irbesartan Norvasc   #6 restless leg syndrome continue Requip   #7 BPH continue Flomax      Code Status Orders        Start     Ordered   04/16/15 2058  Full code   Continuous     04/16/15 2057       Disposition Plan: Rehabilitation likely tomorrow       DVT Prophylaxis   SCDs   Lab Results  Component Value Date   PLT 399 04/19/2015     Time Spent in minutes     Auburn Bilberry M.D on 04/19/2015 at 12:23 PM  Between 7am to 6pm - Pager - [phone]  After 6pm go to [email or web address] - password [email or web address] [facility]  [facility]   Office  [phone]

## 2015-04-19 NOTE — Progress Notes (Signed)
Patient resting in bed, assisted to the chair for few hrs, Hgb- 9.3 today, no active bleeding noted, voiding without any difficulties, IV site intact- Right upper arm PICC IV fluids infusing. No complain of pain. SCD/Teds on bilateral lower extremities.

## 2015-04-19 NOTE — Progress Notes (Signed)
Pt. Alert and oriented. Vss. No c/o pain. Pills whole with water. Pt. Receiving IV antibiotics. Hemoglobin has improved this morning at 9.3. Rested quietly throughout the night.

## 2015-04-20 LAB — CBC
HEMATOCRIT: 29.2 % — AB (ref 40.0–52.0)
HEMOGLOBIN: 9.5 g/dL — AB (ref 13.0–18.0)
MCH: 28.7 pg (ref 26.0–34.0)
MCHC: 32.5 g/dL (ref 32.0–36.0)
MCV: 88.4 fL (ref 80.0–100.0)
Platelets: 432 10*3/uL (ref 150–440)
RBC: 3.3 MIL/uL — ABNORMAL LOW (ref 4.40–5.90)
RDW: 16.6 % — ABNORMAL HIGH (ref 11.5–14.5)
WBC: 10.9 10*3/uL — ABNORMAL HIGH (ref 3.8–10.6)

## 2015-04-20 LAB — TYPE AND SCREEN
ABO/RH(D): O POS
Antibody Screen: NEGATIVE
UNIT DIVISION: 0
Unit division: 0
Unit division: 0

## 2015-04-20 MED ORDER — HEPARIN SOD (PORK) LOCK FLUSH 10 UNIT/ML IV SOLN
10.0000 [IU] | Freq: Once | INTRAVENOUS | Status: AC
  Administered 2015-04-20: 50 [IU] via INTRAVENOUS
  Filled 2015-04-20 (×2): qty 1

## 2015-04-20 MED ORDER — OXYCODONE HCL 5 MG PO TABS: 5.0000 mg | ORAL_TABLET | ORAL | Status: DC | PRN

## 2015-04-20 MED ORDER — SUCRALFATE 1 G PO TABS: 1.0000 g | ORAL_TABLET | Freq: Three times a day (TID) | ORAL | Status: DC

## 2015-04-20 MED ORDER — VANCOMYCIN 50 MG/ML ORAL SOLUTION: 250.0000 mg | Freq: Four times a day (QID) | ORAL | Status: AC

## 2015-04-20 MED ORDER — ALPRAZOLAM 0.5 MG PO TABS: 0.1250 mg | ORAL_TABLET | Freq: Every day | ORAL | Status: DC | PRN

## 2015-04-20 NOTE — Progress Notes (Addendum)
Patient is medically stable for [facility]. Per [name] [facility]. RN will call report at [phone] and arrange EMS for transport. Clinical Child psychotherapistocial Worker (CSW) prepared D/C packet and sent D/C Summary, follow up appointments, and diet order from Dr. Markham JordanElliot to [name] via [facility]. [name] pulled Dr. Jarrett AblesFitzgerald's IV antibiotic orders from last week. Per MD patient will remain on same IV antibiotics as Dr/ Sampson GoonFitzgerald ordered last week. Patient is aware of above. CSW contacted [name] patient's daughter and made her aware of above. Please reconsult if future social work needs arise. CSW signing off.   Jetta LoutBailey Morgan, LCSWA [phone]

## 2015-04-20 NOTE — Plan of Care (Signed)
Problem: Phase I Progression Outcomes Goal: Hemodynamically stable Outcome: Not Progressing Blood pressure remains high Goal: Other Phase I Outcomes/Goals Outcome: Not Progressing Patient is confused and agitated, shows signs of withdrawl

## 2015-04-20 NOTE — Discharge Summary (Signed)
[name], 81 y.o., DOB [birth year], MRN [id number]. Admission date: 04/16/2015 Discharge Date 04/20/2015 Primary MD [name], MD Admitting Physician Houston Siren, MD  Admission Diagnosis  Blood loss anemia [D50.0] Upper GI bleed [K92.2] MRSA (methicillin resistant Staphylococcus aureus) [A49.02] C. difficile colitis [A04.7]  Discharge Diagnosis   Active Problems:   GI bleed      Past Medical History  Diagnosis Date  . GERD (gastroesophageal reflux disease)   . Bradycardia     chronic, no symptoms 07/2010  . Depression   . Anxiety 10/11  . SOB (shortness of breath) 10/11    08/2010,Episodes at 5 AM, eventually felt to be anxiety, after complete workup including catheterization, pt greatly improved with anxiety meds 11/11  . Hypertension     BP higher than usual 04/19/10; amlodipine increased by telephone  . Decreased hearing     Right ear  . Coronary artery disease     mild, cath, 08/2010  . Aortic stenosis     a. s/p mechcanical AVR, 2002  . S/P AVR     a. [employer]. mechanical 2002; b. echo 08/2010 EF 60%, trival AI, mild MR, AVR working well; c. on longterm warfarin tx  . Carotid bruit     dopplers in past, no abnormalities    Past Surgical History  Procedure Laterality Date  . Valve replacement  1/02    Aortic; echo 3/09 valve working well; echo 10/11 working well; put on Coumadin  . Cardiac catheterization    . Joint replacement    . Hernia repair    . Esophagogastroduodenoscopy N/A 04/05/2015    Procedure: ESOPHAGOGASTRODUODENOSCOPY (EGD);  Surgeon: Scot Jun, MD;  Location: [facility] ENDOSCOPY;  Service: Endoscopy;  Laterality: N/A;  . Esophagogastroduodenoscopy N/A 04/17/2015    Procedure: ESOPHAGOGASTRODUODENOSCOPY (EGD);  Surgeon: Scot Jun, MD;  Location: [facility] ENDOSCOPY;  Service: Endoscopy;  Laterality: N/A;      Hospital Course See H&P, Labs, Consult and Test reports for all details in brief, patient was admitted for  Chief  Complaint  Patient presents with  . GI Bleeding    [name] is a 81 y.o. male with a known history of hypertension, restless leg syndrome, mechanical aortic valve replacement surgery, osteoarthritis requiring bilateral hip replacement surgeries and a recent left total hip replacement surgery on 03/21/2015. He was  at [facility] rehabilitation and was brought in secondary to weakness and noted to be anemic. His warfarin was held GI recommended 2 weeks hold off anticoagulation and endoscopy as an outpatient. He had received 2 units of packed red blood cells while in the hospital and his H&H remained stable. In the hospital he was also noted to have staph aureus bacteremia for which he was started on IV Ancef. Infectious disease consultation was obtained with Dr. Clydie Braun who recommended 6 weeks course of IV antibiotic while PICC line. Patient was also evaluated by cardiology concerning his mechanical aortic while they recommended holding anticoagulation considering ongoing anemia workup. Also during that hospitalization hew  was noted to have left upper extremity edema and redness at the site of prior IV, left upper reduction to Doppler was performed which showed DVT. But due to concern for ongoing GI bleed no anticoaguation was started, pt got re-admited with dark tarry stools, he underwent egd which showed gastric ulcer with visible vessel which was treated. Pt also received transfusion during hospitalization. His hemoglobin remains stable no evidence of further bleeding. He did have diarrhea on presenation which  was neg for c.diff, he was diagonosed with c.diff during his recent hosptialization.    Consults  GI  Significant Tests:  See full reports for all details   Dg Chest 2 View  04/11/2015   CLINICAL DATA:  Increase shortness of Breath  EXAM: CHEST  2 VIEW  COMPARISON:  04/09/2015  FINDINGS: Right-sided PICC line is again identified. Postsurgical changes are again seen. The cardiac shadow  is stable but mildly enlarged. No focal infiltrate or sizable effusion is seen. Mild vascular congestion is noted without edema.  IMPRESSION: Mild vascular congestion without edema. No other focal abnormality is seen.   Electronically Signed   By: Alcide CleverMark  Lukens M.D.   On: 04/11/2015 15:52   Dg Chest 2 View  04/09/2015   CLINICAL DATA:  81 year old male with fever. History of cardiac valve replacement.  EXAM: CHEST  2 VIEW  COMPARISON:  10/30/2014.  FINDINGS: Cardiomegaly cardiac valve replacement changes noted.  Patchy opacity at the medial right lung base may represent airspace disease or atelectasis.  There is no evidence of pleural effusion,, pulmonary edema, pneumothorax or pulmonary mass.  No acute bony abnormalities are noted.  IMPRESSION: Patchy medial right lung base opacity -question atelectasis versus airspace disease/ pneumonia.  Cardiomegaly.   Electronically Signed   By: Harmon PierJeffrey  Hu M.D.   On: 04/09/2015 16:49   [place] Venous Img Upper Uni Left  04/12/2015   CLINICAL DATA:  Redness and swelling at previous IV site. Rule out DVT or abscess. Patient has a right arm PICC line.  EXAM: LEFT UPPER EXTREMITY VENOUS DOPPLER ULTRASOUND  TECHNIQUE: Gray-scale sonography with graded compression, as well as color Doppler and duplex ultrasound were performed to evaluate the upper extremity deep venous system from the level of the subclavian vein and including the jugular, axillary, basilic and upper cephalic vein. Spectral Doppler was utilized to evaluate flow at rest and with distal augmentation maneuvers.  COMPARISON:  None.  FINDINGS: Right upper extremity: There is a PICC line in the right subclavian vein. Color Doppler flow in the right subclavian vein with normal phasicity.  Left upper extremity: Normal compressibility, color Doppler flow and phasicity in the left internal jugular vein. Normal color Doppler flow and phasicity in the left subclavian vein. Normal compressibility of the distal left subclavian  vein. Normal compressibility, color Doppler flow and augmentation in the left axillary vein. The proximal left cephalic vein is compressible has color Doppler flow. One of the brachial veins are is non compressible and has some echogenic thrombus. However, this vessel is small and could represent chronic thrombosis. The other visualized left brachial veins are patent.  There is partial compressibility of the left basilic vein in the mid upper arm which is consistent with nonocclusive thrombus. Visualized left radial veins appear to be patent. Limited evaluation of the left ulnar veins.  There is extensive edema in the left lower arm with a thrombosed superficial vein which measures roughly 6 mm. There is no flow in this thrombosed superficial vein.  IMPRESSION: Positive for superficial thrombophlebitis involving a superficial vein in the left forearm. There is also nonocclusive thrombus in the left basilic vein.  There is deep vein thrombosis involving one of the left brachial veins. This thrombosed vein is small and could be chronic but age indeterminate.   Electronically Signed   By: Richarda OverlieAdam  Henn M.D.   On: 04/12/2015 17:09   Dg Chest Port 1 View  04/13/2015   CLINICAL DATA:  Evaluation of PICC line placement  EXAM: PORTABLE CHEST - 1 VIEW  COMPARISON:  Chest x-ray of 04/11/2015  FINDINGS: A right-sided PICC line is present with the tip overlying the lower SVC just above the expected SVC -RA junction. There is some indistinctness of the perihilar vasculature and mild pulmonary vascular congestion cannot be excluded. No infiltrate or effusion is seen. The heart is mildly enlarged. Median sternotomy sutures are noted.  IMPRESSION: 1. Tip of PICC line overlies the lower SVC near the expected SVC -RA junction. 2. Somewhat indistinct pulmonary vascularity may indicate mild pulmonary vascular congestion.   Electronically Signed   By: Dwyane Dee M.D.   On: 04/13/2015 15:47   Dg Chest Port 1 View  04/09/2015    CLINICAL DATA:  PICC line placement  EXAM: PORTABLE CHEST - 1 VIEW  COMPARISON:  04/09/2015  FINDINGS: Sternotomy wires overlie normal cardiac silhouette. Interval placement of a right PICC line with tip in the distal SVC. Mild basilar atelectasis.  IMPRESSION: Right PICC line in appropriate position.   Electronically Signed   By: Genevive Bi M.D.   On: 04/09/2015 17:17       Today   Subjective:   [name]  Feels ok no complaints. States small bm with dark color stool .   Objective:   Blood pressure 135/90, pulse 88, temperature 97 F (36.1 C), temperature source Oral, resp. rate 18, height 5\' 10"  (1.778 m), weight 90.2 kg (198 lb 13.7 oz), SpO2 95 %.  .  Intake/Output Summary (Last 24 hours) at 04/20/15 0905 Last data filed at 04/20/15 0830  Gross per 24 hour  Intake   2428 ml  Output   2500 ml  Net    -72 ml    Exam VITAL SIGNS: Blood pressure 135/90, pulse 88, temperature 97 F (36.1 C), temperature source Oral, resp. rate 18, height 5\' 10"  (1.778 m), weight 90.2 kg (198 lb 13.7 oz), SpO2 95 %.  GENERAL:  81 y.o.-year-old patient lying in the bed with no acute distress.  EYES: Pupils equal, round, reactive to light and accommodation. No scleral icterus. Extraocular muscles intact.  HEENT: Head atraumatic, normocephalic. Oropharynx and nasopharynx clear.  NECK:  Supple, no jugular venous distention. No thyroid enlargement, no tenderness.  LUNGS: Normal breath sounds bilaterally, no wheezing, rales,rhonchi or crepitation. No use of accessory muscles of respiration.  CARDIOVASCULAR: S1, S2 normal. No murmurs, rubs, or gallops.  ABDOMEN: Soft, nontender, nondistended. Bowel sounds present. No organomegaly or mass.  EXTREMITIES: No pedal edema, cyanosis, or clubbing.  NEUROLOGIC: Cranial nerves II through XII are intact. Muscle strength 5/5 in all extremities. Sensation intact. Gait not checked.  PSYCHIATRIC: The patient is alert and oriented x 3.  SKIN: No obvious rash,  lesion, or ulcer.   Data Review   Cultures -   CBC w Diff: Lab Results  Component Value Date   WBC 10.9* 04/20/2015   WBC 8.8 03/20/2015   HGB 9.5* 04/20/2015   HGB 8.2* 03/23/2015   HCT 29.2* 04/20/2015   HCT 31.4* 03/20/2015   PLT 432 04/20/2015   PLT 311 03/22/2015   LYMPHOPCT 16 04/16/2015   LYMPHOPCT 9.1 11/01/2014   MONOPCT 9 04/16/2015   MONOPCT 11.7 11/01/2014   EOSPCT 3 04/16/2015   EOSPCT 3.5 11/01/2014   BASOPCT 0 04/16/2015   BASOPCT 0.3 11/01/2014   CMP: Lab Results  Component Value Date   NA 131* 04/19/2015   NA 129* 03/23/2015   K 4.0 04/19/2015   K 3.7 03/23/2015   CL 99*  04/19/2015   CL 96* 03/23/2015   CO2 27 04/19/2015   CO2 28 03/23/2015   BUN <5* 04/19/2015   BUN 19 03/23/2015   CREATININE 0.60* 04/19/2015   CREATININE 0.72 03/23/2015   PROT 4.7* 04/16/2015   PROT 7.2 07/28/2014   ALBUMIN 2.4* 04/16/2015   ALBUMIN 4.1 07/28/2014   BILITOT 0.2* 04/16/2015   ALKPHOS 61 04/16/2015   ALKPHOS 98 07/28/2014   AST 14* 04/16/2015   AST 22 07/28/2014   ALT 6* 04/16/2015   ALT 19 07/28/2014  .  Micro Results Recent Results (from the past 240 hour(s))  C difficile quick scan w PCR reflex [facility])     Status: None   Collection Time: 04/10/15  2:14 PM  Result Value Ref Range Status   C Diff antigen POSITIVE  Final   C Diff toxin NEGATIVE  Final   C Diff interpretation   Final    Positive for toxigenic C. difficile, active toxin production not detected. Patient has toxigenic C. difficile organisms present in the bowel, but toxin was not detected. The patient may be a carrier or the level of toxin in the sample was below the limit  of detection. This information should be used in conjunction with the patient's clinical history when deciding on possible therapy.   Clostridium Difficile by PCR     Status: Abnormal   Collection Time: 04/10/15  2:14 PM  Result Value Ref Range Status   C difficile by pcr POSITIVE (A) NEGATIVE Final    Comment:  CRITICAL RESULT CALLED TO, READ BACK BY AND VERIFIED WITH: ANNA RODRIGUEZ AT 1710 04/10/15 SDR   Culture, blood (routine x 2)     Status: None   Collection Time: 04/11/15  4:14 PM  Result Value Ref Range Status   Specimen Description BLOOD  Final   Special Requests Normal  Final   Culture NO GROWTH 5 DAYS  Final   Report Status 04/16/2015 FINAL  Final  Culture, blood (routine x 2)     Status: None   Collection Time: 04/11/15  4:14 PM  Result Value Ref Range Status   Specimen Description BLOOD  Final   Special Requests Normal  Final   Culture NO GROWTH 5 DAYS  Final   Report Status 04/16/2015 FINAL  Final  Cath Tip Culture     Status: None   Collection Time: 04/13/15 12:15 PM  Result Value Ref Range Status   Specimen Description   Final   Special Requests NONE  Final   Culture NO GROWTH 4 DAYS  Final   Report Status 04/17/2015 FINAL  Final  C difficile quick scan w PCR reflex [facility])     Status: None   Collection Time: 04/17/15 12:20 AM  Result Value Ref Range Status   C Diff antigen NEGATIVE  Final   C Diff toxin NEGATIVE  Final   C Diff interpretation Negative for C. difficile  Final        Code Status Orders        Start     Ordered   04/16/15 2058  Full code   Continuous     04/16/15 2057       Discharge Instructions      Follow-up Information    Follow up with [name], MD In 7 days.   Specialty:  Internal Medicine   Contact information:   [address] [facility] [place] [address] [phone]  Follow up with Sampson Goon, DAVID, MD In 10 days.   Specialty:  Infectious Diseases   Contact information:   [address] [facility] [place] [address] [phone]       Follow up with Julien Nordmann, MD In 2 weeks.   Specialty:  Cardiology   Contact information:   [address] [place] [address] [phone]       Discharge Medications     Medication List     STOP taking these medications        celecoxib 200 MG capsule  Commonly known as:  CELEBREX      TAKE these medications        acetaminophen 500 MG tablet  Commonly known as:  TYLENOL  Take 500 mg by mouth every 4 (four) hours as needed for mild pain.     ALPRAZolam 0.5 MG tablet  Commonly known as:  XANAX  Take 0.5 tablets (0.25 mg total) by mouth daily as needed for anxiety.     aluminum-magnesium hydroxide 200-200 MG/5ML suspension  Take 30 mLs by mouth 2 (two) times daily as needed for indigestion.     amLODipine 10 MG tablet  Commonly known as:  NORVASC  Take 1 tablet by mouth  daily     ceFAZolin 2-3 GM-% Solr  Commonly known as:  ANCEF  Inject 50 mLs (2 g total) into the vein every 8 (eight) hours.     citalopram 20 MG tablet  Commonly known as:  CELEXA  Take 20 mg by mouth daily.     cyclobenzaprine 10 MG tablet  Commonly known as:  FLEXERIL  Take 10 mg by mouth 3 (three) times daily as needed for muscle spasms.     finasteride 5 MG tablet  Commonly known as:  PROSCAR  Take 5 mg by mouth daily.     FLOMAX 0.4 MG Caps capsule  Generic drug:  tamsulosin  Take 0.4 mg by mouth daily.     multivitamin tablet  Take 1 tablet by mouth daily.     oxyCODONE 5 MG immediate release tablet  Commonly known as:  Oxy IR/ROXICODONE  Take 1 tablet (5 mg total) by mouth every 4 (four) hours as needed for severe pain.     pantoprazole 40 MG tablet  Commonly known as:  PROTONIX  Take 1 tablet (40 mg total) by mouth 2 (two) times daily.     rOPINIRole 1 MG tablet  Commonly known as:  REQUIP  Take 1 tablet (1 mg total) by mouth 3 (three) times daily.     sucralfate 1 G tablet  Commonly known as:  CARAFATE  Take 1 tablet (1 g total) by mouth 4 (four) times daily -  with meals and at bedtime.     traZODone 50 MG tablet  Commonly known as:  DESYREL  Take 50 mg by mouth at bedtime.     valsartan 320 MG tablet  Commonly known as:  DIOVAN  Take 320 mg by mouth daily.      vancomycin 50 mg/mL oral solution  Commonly known as:  VANCOCIN  Take 5 mLs (250 mg total) by mouth every 6 (six) hours.         Total Time in preparing paper work, data evaluation and todays exam - 35 minutes  Auburn Bilberry M.D on 04/20/2015 at 9:05 AM  [facility]   Office  [phone]

## 2015-04-20 NOTE — Progress Notes (Signed)
rn called report to s[facility]. I/s staff on diet chg per  Dr Mechele Collinelliott and when coumadin can be resumed.

## 2015-04-20 NOTE — Progress Notes (Signed)
[facility], report called by Dava I,RN, PICC line dressing changed prior to discharge per protocol and heparinize, patient to go with PICC for long term abx, discharge via stretcher with EMS staff. Valuable send with patient, Discharge packet with EMS staff.

## 2015-04-20 NOTE — Outcomes Assessment (Signed)
VSS, patient is A+O with no signs of distress. Denies pain at this time. Voids without difficulty.  IV fluids infusing.

## 2015-04-20 NOTE — Progress Notes (Signed)
picc remains sluggish. Per vacular rn michelle needs to be heparinized qd. rn spoke with dr patel who reports that amount of heparin will not hurt pt and start heparin flush 10 units per ml,5cc qd

## 2015-04-20 NOTE — Progress Notes (Signed)
IV abx due, unable to adm, PICC line will not flush, called vascular team to assess.

## 2015-04-20 NOTE — Discharge Instructions (Signed)
°  DIET:  Cardiac diet  DISCHARGE CONDITION:  Stable  ACTIVITY:  Activity as tolerated, pt eval and treat  OXYGEN:  Home Oxygen: No.   Oxygen Delivery: room air  DISCHARGE LOCATION:  nursing home   Routine picc care Iv antibiotics stop date to be determined by dr. Carolin Coyfitzeral Check cbc in 3 days  If you experience worsening of your admission symptoms, develop shortness of breath, life threatening emergency, suicidal or homicidal thoughts you must seek medical attention immediately by calling 911 or calling your MD immediately  if symptoms less severe.  You Must read complete instructions/literature along with all the possible adverse reactions/side effects for all the Medicines you take and that have been prescribed to you. Take any new Medicines after you have completely understood and accpet all the possible adverse reactions/side effects.   Please note  You were cared for by a hospitalist during your hospital stay. If you have any questions about your discharge medications or the care you received while you were in the hospital after you are discharged, you can call the unit and asked to speak with the hospitalist on call if the hospitalist that took care of you is not available. Once you are discharged, your primary care physician will handle any further medical issues. Please note that NO REFILLS for any discharge medications will be authorized once you are discharged, as it is imperative that you return to your primary care physician (or establish a relationship with a primary care physician if you do not have one) for your aftercare needs so that they can reassess your need for medications and monitor your lab values.

## 2015-04-26 ENCOUNTER — Encounter
Admission: RE | Admit: 2015-04-26 | Discharge: 2015-04-26 | Disposition: A | Discharge status: Home / Self Care | Payer: Medicare Other | Source: Ambulatory Visit | Attending: Internal Medicine | Admitting: Internal Medicine

## 2015-04-26 DIAGNOSIS — A4902 Methicillin resistant Staphylococcus aureus infection, unspecified site: Secondary | ICD-10-CM | POA: Insufficient documentation

## 2015-04-26 DIAGNOSIS — K922 Gastrointestinal hemorrhage, unspecified: Secondary | ICD-10-CM | POA: Insufficient documentation

## 2015-04-27 ENCOUNTER — Ambulatory Visit
Admission: RE | Admit: 2015-04-27 | Payer: Medicare Other | Source: Ambulatory Visit | Admitting: Unknown Physician Specialty

## 2015-04-27 ENCOUNTER — Encounter: Admission: RE | Payer: Self-pay | Source: Ambulatory Visit

## 2015-04-27 DIAGNOSIS — K922 Gastrointestinal hemorrhage, unspecified: Secondary | ICD-10-CM | POA: Diagnosis not present

## 2015-04-27 DIAGNOSIS — A4902 Methicillin resistant Staphylococcus aureus infection, unspecified site: Secondary | ICD-10-CM | POA: Diagnosis present

## 2015-04-27 LAB — COMPREHENSIVE METABOLIC PANEL
ALT: 6 U/L — ABNORMAL LOW (ref 17–63)
AST: 38 U/L (ref 15–41)
Albumin: 3.2 g/dL — ABNORMAL LOW (ref 3.5–5.0)
Alkaline Phosphatase: 85 U/L (ref 38–126)
Anion gap: 9 (ref 5–15)
BUN: 16 mg/dL (ref 6–20)
CO2: 25 mmol/L (ref 22–32)
CREATININE: 0.68 mg/dL (ref 0.61–1.24)
Calcium: 8.6 mg/dL — ABNORMAL LOW (ref 8.9–10.3)
Chloride: 98 mmol/L — ABNORMAL LOW (ref 101–111)
GFR calc Af Amer: >60 mL/min (ref >60)
GFR calc non Af Amer: >60 mL/min (ref >60)
Glucose, Bld: 84 mg/dL (ref 65–99)
POTASSIUM: 4.7 mmol/L (ref 3.5–5.1)
Sodium: 132 mmol/L — ABNORMAL LOW (ref 135–145)
TOTAL PROTEIN: 6.3 g/dL — AB (ref 6.5–8.1)
Total Bilirubin: 0.9 mg/dL (ref 0.3–1.2)

## 2015-04-27 LAB — CBC
HCT: 32.1 % — ABNORMAL LOW (ref 40.0–52.0)
Hemoglobin: 10.2 g/dL — ABNORMAL LOW (ref 13.0–18.0)
MCH: 28.1 pg (ref 26.0–34.0)
MCHC: 31.7 g/dL — ABNORMAL LOW (ref 32.0–36.0)
MCV: 88.6 fL (ref 80.0–100.0)
PLATELETS: 289 10*3/uL (ref 150–440)
RBC: 3.62 MIL/uL — AB (ref 4.40–5.90)
RDW: 16.4 % — AB (ref 11.5–14.5)
WBC: 8.1 10*3/uL (ref 3.8–10.6)

## 2015-04-27 SURGERY — ESOPHAGOGASTRODUODENOSCOPY (EGD) WITH PROPOFOL
Anesthesia: Monitor Anesthesia Care

## 2015-05-04 DIAGNOSIS — K922 Gastrointestinal hemorrhage, unspecified: Secondary | ICD-10-CM | POA: Diagnosis not present

## 2015-05-04 LAB — COMPREHENSIVE METABOLIC PANEL
ANION GAP: 7 (ref 5–15)
AST: 17 U/L (ref 15–41)
Albumin: 3 g/dL — ABNORMAL LOW (ref 3.5–5.0)
Alkaline Phosphatase: 79 U/L (ref 38–126)
BILIRUBIN TOTAL: 0.3 mg/dL (ref 0.3–1.2)
BUN: 14 mg/dL (ref 6–20)
CO2: 29 mmol/L (ref 22–32)
Calcium: 8.6 mg/dL — ABNORMAL LOW (ref 8.9–10.3)
Chloride: 99 mmol/L — ABNORMAL LOW (ref 101–111)
Creatinine, Ser: 0.63 mg/dL (ref 0.61–1.24)
GFR calc non Af Amer: >60 mL/min (ref >60)
Glucose, Bld: 84 mg/dL (ref 65–99)
Potassium: 3.8 mmol/L (ref 3.5–5.1)
Sodium: 135 mmol/L (ref 135–145)
TOTAL PROTEIN: 5.6 g/dL — AB (ref 6.5–8.1)

## 2015-05-04 LAB — CBC WITH DIFFERENTIAL/PLATELET
BASOS PCT: 1 %
Basophils Absolute: 0 10*3/uL (ref 0–0.1)
EOS ABS: 0.2 10*3/uL (ref 0–0.7)
EOS PCT: 3 %
HCT: 27.5 % — ABNORMAL LOW (ref 40.0–52.0)
Hemoglobin: 8.9 g/dL — ABNORMAL LOW (ref 13.0–18.0)
Lymphocytes Relative: 19 %
Lymphs Abs: 1.1 10*3/uL (ref 1.0–3.6)
MCH: 27.8 pg (ref 26.0–34.0)
MCHC: 32.4 g/dL (ref 32.0–36.0)
MCV: 85.7 fL (ref 80.0–100.0)
MONOS PCT: 10 %
Monocytes Absolute: 0.6 10*3/uL (ref 0.2–1.0)
NEUTROS PCT: 67 %
Neutro Abs: 3.9 10*3/uL (ref 1.4–6.5)
PLATELETS: 299 10*3/uL (ref 150–440)
RBC: 3.21 MIL/uL — ABNORMAL LOW (ref 4.40–5.90)
RDW: 16.1 % — ABNORMAL HIGH (ref 11.5–14.5)
WBC: 5.7 10*3/uL (ref 3.8–10.6)

## 2015-05-04 LAB — PROTIME-INR
INR: 1.49
Prothrombin Time: 18.2 seconds — ABNORMAL HIGH (ref 11.4–15.0)

## 2015-05-06 ENCOUNTER — Other Ambulatory Visit
Admission: RE | Admit: 2015-05-06 | Discharge: 2015-05-06 | Disposition: A | Discharge status: Home / Self Care | Payer: Medicare Other | Source: Ambulatory Visit | Attending: Gerontology | Admitting: Gerontology

## 2015-05-06 DIAGNOSIS — Z952 Presence of prosthetic heart valve: Secondary | ICD-10-CM | POA: Diagnosis present

## 2015-05-06 LAB — PROTIME-INR
INR: 2.34
Prothrombin Time: 25.8 seconds — ABNORMAL HIGH (ref 11.4–15.0)

## 2015-05-09 DIAGNOSIS — K922 Gastrointestinal hemorrhage, unspecified: Secondary | ICD-10-CM | POA: Diagnosis not present

## 2015-05-09 LAB — CBC
HCT: 29 % — ABNORMAL LOW (ref 40.0–52.0)
Hemoglobin: 9.5 g/dL — ABNORMAL LOW (ref 13.0–18.0)
MCH: 27.8 pg (ref 26.0–34.0)
MCHC: 32.6 g/dL (ref 32.0–36.0)
MCV: 85.3 fL (ref 80.0–100.0)
PLATELETS: 324 10*3/uL (ref 150–440)
RBC: 3.4 MIL/uL — ABNORMAL LOW (ref 4.40–5.90)
RDW: 15.9 % — ABNORMAL HIGH (ref 11.5–14.5)
WBC: 6.4 10*3/uL (ref 3.8–10.6)

## 2015-05-09 LAB — PROTIME-INR
INR: 3.21
PROTHROMBIN TIME: 32.9 s — AB (ref 11.4–15.0)

## 2015-05-16 DIAGNOSIS — K922 Gastrointestinal hemorrhage, unspecified: Secondary | ICD-10-CM | POA: Diagnosis not present

## 2015-05-16 LAB — COMPREHENSIVE METABOLIC PANEL
ALT: <5 U/L — ABNORMAL LOW (ref 17–63)
ANION GAP: 4 — AB (ref 5–15)
AST: 19 U/L (ref 15–41)
Albumin: 3.5 g/dL (ref 3.5–5.0)
Alkaline Phosphatase: 97 U/L (ref 38–126)
BUN: 15 mg/dL (ref 6–20)
CALCIUM: 8.1 mg/dL — AB (ref 8.9–10.3)
CO2: 27 mmol/L (ref 22–32)
CREATININE: 0.75 mg/dL (ref 0.61–1.24)
Chloride: 98 mmol/L — ABNORMAL LOW (ref 101–111)
GFR calc non Af Amer: >60 mL/min (ref >60)
GLUCOSE: 92 mg/dL (ref 65–99)
Potassium: 3.2 mmol/L — ABNORMAL LOW (ref 3.5–5.1)
Sodium: 129 mmol/L — ABNORMAL LOW (ref 135–145)
TOTAL PROTEIN: 6.6 g/dL (ref 6.5–8.1)
Total Bilirubin: 0.5 mg/dL (ref 0.3–1.2)

## 2015-05-16 LAB — CBC WITH DIFFERENTIAL/PLATELET
BASOS PCT: 1 %
Basophils Absolute: 0.1 10*3/uL (ref 0–0.1)
EOS ABS: 0.2 10*3/uL (ref 0–0.7)
Eosinophils Relative: 3 %
HCT: 30.1 % — ABNORMAL LOW (ref 40.0–52.0)
Hemoglobin: 9.7 g/dL — ABNORMAL LOW (ref 13.0–18.0)
Lymphocytes Relative: 7 %
Lymphs Abs: 0.6 10*3/uL — ABNORMAL LOW (ref 1.0–3.6)
MCH: 26.9 pg (ref 26.0–34.0)
MCHC: 32.3 g/dL (ref 32.0–36.0)
MCV: 83.4 fL (ref 80.0–100.0)
Monocytes Absolute: 0.6 10*3/uL (ref 0.2–1.0)
Monocytes Relative: 6 %
NEUTROS PCT: 83 %
Neutro Abs: 7.4 10*3/uL — ABNORMAL HIGH (ref 1.4–6.5)
Platelets: 359 10*3/uL (ref 150–440)
RBC: 3.61 MIL/uL — AB (ref 4.40–5.90)
RDW: 16.5 % — ABNORMAL HIGH (ref 11.5–14.5)
WBC: 8.9 10*3/uL (ref 3.8–10.6)

## 2015-05-16 LAB — PROTIME-INR
INR: 2.12
Prothrombin Time: 23.9 seconds — ABNORMAL HIGH (ref 11.4–15.0)

## 2015-05-18 DIAGNOSIS — K922 Gastrointestinal hemorrhage, unspecified: Secondary | ICD-10-CM | POA: Diagnosis not present

## 2015-05-18 LAB — CBC WITH DIFFERENTIAL/PLATELET
Basophils Absolute: 0 10*3/uL (ref 0–0.1)
Basophils Relative: 0 %
Eosinophils Absolute: 0.3 10*3/uL (ref 0–0.7)
Eosinophils Relative: 4 %
HCT: 29.7 % — ABNORMAL LOW (ref 40.0–52.0)
Hemoglobin: 9.4 g/dL — ABNORMAL LOW (ref 13.0–18.0)
LYMPHS ABS: 0.8 10*3/uL — AB (ref 1.0–3.6)
LYMPHS PCT: 12 %
MCH: 26.2 pg (ref 26.0–34.0)
MCHC: 31.8 g/dL — ABNORMAL LOW (ref 32.0–36.0)
MCV: 82.5 fL (ref 80.0–100.0)
Monocytes Absolute: 0.6 10*3/uL (ref 0.2–1.0)
Monocytes Relative: 9 %
NEUTROS PCT: 75 %
Neutro Abs: 5.2 10*3/uL (ref 1.4–6.5)
PLATELETS: 358 10*3/uL (ref 150–440)
RBC: 3.6 MIL/uL — AB (ref 4.40–5.90)
RDW: 16.5 % — AB (ref 11.5–14.5)
WBC: 7 10*3/uL (ref 3.8–10.6)

## 2015-05-18 LAB — COMPREHENSIVE METABOLIC PANEL
ALBUMIN: 3.3 g/dL — AB (ref 3.5–5.0)
ALT: <5 U/L — ABNORMAL LOW (ref 17–63)
ANION GAP: 8 (ref 5–15)
AST: 17 U/L (ref 15–41)
Alkaline Phosphatase: 94 U/L (ref 38–126)
BUN: 12 mg/dL (ref 6–20)
CO2: 28 mmol/L (ref 22–32)
CREATININE: 0.63 mg/dL (ref 0.61–1.24)
Calcium: 8.9 mg/dL (ref 8.9–10.3)
Chloride: 96 mmol/L — ABNORMAL LOW (ref 101–111)
GFR calc Af Amer: >60 mL/min (ref >60)
Glucose, Bld: 93 mg/dL (ref 65–99)
Potassium: 4.1 mmol/L (ref 3.5–5.1)
Sodium: 132 mmol/L — ABNORMAL LOW (ref 135–145)
Total Bilirubin: 0.4 mg/dL (ref 0.3–1.2)
Total Protein: 6.4 g/dL — ABNORMAL LOW (ref 6.5–8.1)

## 2015-05-18 LAB — PROTIME-INR
INR: 2.73
Prothrombin Time: 29 seconds — ABNORMAL HIGH (ref 11.4–15.0)

## 2015-05-23 DIAGNOSIS — K922 Gastrointestinal hemorrhage, unspecified: Secondary | ICD-10-CM | POA: Diagnosis not present

## 2015-05-23 LAB — CBC WITH DIFFERENTIAL/PLATELET
BASOS ABS: 0 10*3/uL (ref 0–0.1)
Basophils Relative: 1 %
Eosinophils Absolute: 0.4 10*3/uL (ref 0–0.7)
Eosinophils Relative: 5 %
HCT: 29.1 % — ABNORMAL LOW (ref 40.0–52.0)
Hemoglobin: 9.2 g/dL — ABNORMAL LOW (ref 13.0–18.0)
LYMPHS ABS: 0.9 10*3/uL — AB (ref 1.0–3.6)
LYMPHS PCT: 13 %
MCH: 25.7 pg — ABNORMAL LOW (ref 26.0–34.0)
MCHC: 31.7 g/dL — ABNORMAL LOW (ref 32.0–36.0)
MCV: 81 fL (ref 80.0–100.0)
Monocytes Absolute: 0.6 10*3/uL (ref 0.2–1.0)
Monocytes Relative: 9 %
Neutro Abs: 4.8 10*3/uL (ref 1.4–6.5)
Neutrophils Relative %: 72 %
PLATELETS: 480 10*3/uL — AB (ref 150–440)
RBC: 3.59 MIL/uL — AB (ref 4.40–5.90)
RDW: 16.9 % — AB (ref 11.5–14.5)
WBC: 6.8 10*3/uL (ref 3.8–10.6)

## 2015-05-23 LAB — COMPREHENSIVE METABOLIC PANEL
ALBUMIN: 3.3 g/dL — AB (ref 3.5–5.0)
ALT: 5 U/L — ABNORMAL LOW (ref 17–63)
AST: 17 U/L (ref 15–41)
Alkaline Phosphatase: 109 U/L (ref 38–126)
Anion gap: 6 (ref 5–15)
BILIRUBIN TOTAL: 0.2 mg/dL — AB (ref 0.3–1.2)
BUN: 14 mg/dL (ref 6–20)
CHLORIDE: 97 mmol/L — AB (ref 101–111)
CO2: 28 mmol/L (ref 22–32)
CREATININE: 0.72 mg/dL (ref 0.61–1.24)
Calcium: 8.7 mg/dL — ABNORMAL LOW (ref 8.9–10.3)
GFR calc Af Amer: >60 mL/min (ref >60)
GFR calc non Af Amer: >60 mL/min (ref >60)
Glucose, Bld: 101 mg/dL — ABNORMAL HIGH (ref 65–99)
POTASSIUM: 4 mmol/L (ref 3.5–5.1)
Sodium: 131 mmol/L — ABNORMAL LOW (ref 135–145)
Total Protein: 6.6 g/dL (ref 6.5–8.1)

## 2015-05-23 LAB — PROTIME-INR
INR: 3.39
Prothrombin Time: 34.3 seconds — ABNORMAL HIGH (ref 11.4–15.0)

## 2015-05-26 ENCOUNTER — Ambulatory Visit (INDEPENDENT_AMBULATORY_CARE_PROVIDER_SITE_OTHER): Payer: Medicare Other | Admitting: Cardiology

## 2015-05-26 ENCOUNTER — Ambulatory Visit (INDEPENDENT_AMBULATORY_CARE_PROVIDER_SITE_OTHER): Payer: Medicare Other | Admitting: Pharmacist

## 2015-05-26 ENCOUNTER — Encounter: Payer: Self-pay | Admitting: Cardiology

## 2015-05-26 VITALS — BP 140/60 | HR 79 | Ht 70.0 in | Wt 185.4 lb

## 2015-05-26 DIAGNOSIS — Z954 Presence of other heart-valve replacement: Secondary | ICD-10-CM

## 2015-05-26 DIAGNOSIS — A047 Enterocolitis due to Clostridium difficile: Secondary | ICD-10-CM | POA: Diagnosis not present

## 2015-05-26 DIAGNOSIS — I359 Nonrheumatic aortic valve disorder, unspecified: Secondary | ICD-10-CM

## 2015-05-26 DIAGNOSIS — Z7901 Long term (current) use of anticoagulants: Secondary | ICD-10-CM

## 2015-05-26 DIAGNOSIS — Z952 Presence of prosthetic heart valve: Secondary | ICD-10-CM

## 2015-05-26 DIAGNOSIS — K254 Chronic or unspecified gastric ulcer with hemorrhage: Secondary | ICD-10-CM | POA: Diagnosis not present

## 2015-05-26 DIAGNOSIS — I350 Nonrheumatic aortic (valve) stenosis: Secondary | ICD-10-CM | POA: Diagnosis not present

## 2015-05-26 DIAGNOSIS — R7881 Bacteremia: Secondary | ICD-10-CM

## 2015-05-26 DIAGNOSIS — R9430 Abnormal result of cardiovascular function study, unspecified: Secondary | ICD-10-CM

## 2015-05-26 DIAGNOSIS — I2510 Atherosclerotic heart disease of native coronary artery without angina pectoris: Secondary | ICD-10-CM

## 2015-05-26 DIAGNOSIS — R0989 Other specified symptoms and signs involving the circulatory and respiratory systems: Secondary | ICD-10-CM

## 2015-05-26 DIAGNOSIS — A0472 Enterocolitis due to Clostridium difficile, not specified as recurrent: Secondary | ICD-10-CM

## 2015-05-26 DIAGNOSIS — B9562 Methicillin resistant Staphylococcus aureus infection as the cause of diseases classified elsewhere: Secondary | ICD-10-CM

## 2015-05-26 LAB — POCT INR: INR: 2.3

## 2015-05-26 NOTE — Patient Instructions (Addendum)
Medication Instructions:  Same-no change  Labwork: None  Testing/Procedures: None  Follow-Up: Your physician wants you to follow-up in: 6 months with Dr Mariah MillingGollan in the [place] office. You will receive a reminder letter in the mail two months in advance. If you don't receive a letter, please call our office to schedule the follow-up appointment.

## 2015-05-26 NOTE — Assessment & Plan Note (Signed)
Patient received a mechanical valve in 2002. His valve is worked well over the years. Echo May, 2016 revealed that the valve was poorly seen. No obvious vegetation was seen but this cannot be ruled out. There was mild increase in the transvalvular gradient. This echo was done around the time that the patient had MRSA bacteremia for which she was treated with IV antibodies for 6 weeks. He seems stable at this time. No further workup.

## 2015-05-26 NOTE — Assessment & Plan Note (Signed)
The patient had to be off Coumadin on he had his major GI bleed but it was restarted after his gastric ulcer was cauterized.

## 2015-05-26 NOTE — Assessment & Plan Note (Addendum)
He had a 2-D echo that was technically difficult. No obvious vegetation was seen. He did not have a TEE. He was treated with 6 weeks of antibodies for this diagnosis from his hospitalization in May, 2016. He is doing well and planning to follow-up with infectious disease.  As part of today's evaluation I have reviewed the patient's extensive hospital records from May, 2016. I spent greater than 25 minutes with the patient's overall care. More than half of the 25 minutes was with direct contact with the patient reviewing his history and talking about all aspects of his care.

## 2015-05-26 NOTE — Assessment & Plan Note (Signed)
Ejection fraction remains 60% by echo, May, 2016.

## 2015-05-26 NOTE — Progress Notes (Signed)
Cardiology Office Note   Date:  05/26/2015   ID:  [name], DOB Jul 23, 1931, MRN [id number]  PCP:  [name], [name], MD  Cardiologist:  Willa RoughJeffrey Lorenz Donley, MD   Chief Complaint  Patient presents with  . Appointment    Follow-up aortic valve disease      History of Present Illness: [name] is a 81 y.o. male who presents today to follow-up aortic valvular disease. I saw him last December, 2014. He had had a mechanical valve placed in the aortic position in 2002. Over time he's done very well. His echoes have shown good LV function and good function of his valve. I was to see him in December, 2015 for a yearly follow-up. Around that time however he had hip surgery. A limited procedure was not adequate. Therefore he had repeat surgery. While he was recovering in a rehabilitation center, he was readmitted to the hospital with a GI bleed. This was a significant bleed and ultimately he was found to have an ulcer in a fold in his stomach. He was transfused and stabilized. Echo revealed good left ventricular function area unfortunately he developed C. difficile colitis. He also had MRSA bacteremia. There was no definite proof of endocarditis. He was seen by infectious disease and 6 weeks of anti-biotics was recommended. He received this with a PICC line in his right arm and finished his antibiotics this week. He has follow-up planned with infectious disease. He was seen by Dr. Mariah MillingGollan of our team in the hospital at [facility].  From the cardiac viewpoint he is feeling well. He is not having any chest pain or shortness of breath.  The patient is aware that I will be retiring at the end of September, 2016. He will follow with Dr. Mariah MillingGollan in the [place] office.     Past Medical History  Diagnosis Date  . GERD (gastroesophageal reflux disease)   . Bradycardia     chronic, no symptoms 07/2010  . Depression   . Anxiety 10/11  . SOB (shortness of breath) 10/11   08/2010,Episodes at 5 AM, eventually felt to be anxiety, after complete workup including catheterization, pt greatly improved with anxiety meds 11/11  . Hypertension     BP higher than usual 04/19/10; amlodipine increased by telephone  . Decreased hearing     Right ear  . Coronary artery disease     mild, cath, 08/2010  . Aortic stenosis     a. s/p mechcanical AVR, 2002  . S/P AVR     a. St. Jude. mechanical 2002; b. echo 08/2010 EF 60%, trival AI, mild MR, AVR working well; c. on longterm warfarin tx  . Carotid bruit     dopplers in past, no abnormalities    Past Surgical History  Procedure Laterality Date  . Valve replacement  1/02    Aortic; echo 3/09 valve working well; echo 10/11 working well; put on Coumadin  . Cardiac catheterization    . Joint replacement    . Hernia repair    . Esophagogastroduodenoscopy N/A 04/05/2015    Procedure: ESOPHAGOGASTRODUODENOSCOPY (EGD);  Surgeon: [name], MD;  [facility]COPY;  Service: Endoscopy;  Laterality: N/A;  . Esophagogastroduodenoscopy N/A 04/17/2015    Procedure: ESOPHAGOGASTRODUODENOSCOPY (EGD);  Surgeon: [name], MD;  [facility]COPY;  Service: Endoscopy;  Laterality: N/A;    Patient Active Problem List   Diagnosis Date Noted  . Bradycardia     Priority: High  . Hypertension  Priority: High  . Coronary artery disease     Priority: High  . S/P AVR     Priority: High  . Warfarin anticoagulation     Priority: High  . Ejection fraction     Priority: High  . GI bleed 04/16/2015  . Blood loss anemia   . Bleeding gastrointestinal   . S/P AVR (aortic valve replacement)   . Anemia 04/02/2015  . GERD (gastroesophageal reflux disease)   . Depression   . Anxiety   . SOB (shortness of breath)   . Decreased hearing   . Aortic stenosis   . Carotid bruit       Current Outpatient Prescriptions  Medication Sig Dispense Refill  . acetaminophen (TYLENOL) 500 MG tablet Take 500 mg by mouth  every 4 (four) hours as needed for mild pain.     Marland Kitchen ALPRAZolam (XANAX) 0.5 MG tablet Take 0.5 tablets (0.25 mg total) by mouth daily as needed for anxiety. 10 tablet 0  . aluminum-magnesium hydroxide 200-200 MG/5ML suspension Take 30 mLs by mouth 2 (two) times daily as needed for indigestion.    Marland Kitchen amLODipine (NORVASC) 10 MG tablet Take 1 tablet by mouth  daily 90 tablet 0  . celecoxib (CELEBREX) 200 MG capsule Take 200 mg by mouth as needed for moderate pain (back and knee pain).     . citalopram (CELEXA) 20 MG tablet Take 20 mg by mouth daily.    . finasteride (PROSCAR) 5 MG tablet Take 5 mg by mouth daily.      . Multiple Vitamin (MULTIVITAMIN) tablet Take 1 tablet by mouth daily.      Marland Kitchen rOPINIRole (REQUIP) 1 MG tablet Take 1 tablet (1 mg total) by mouth 3 (three) times daily. 10 tablet 0  . sucralfate (CARAFATE) 1 G tablet Take 1 tablet (1 g total) by mouth 4 (four) times daily -  with meals and at bedtime. 10 tablet 0  . Tamsulosin HCl (FLOMAX) 0.4 MG CAPS Take 0.4 mg by mouth daily.      . traZODone (DESYREL) 50 MG tablet Take 50 mg by mouth at bedtime.    . valsartan (DIOVAN) 320 MG tablet Take 320 mg by mouth daily.       No current facility-administered medications for this visit.    Allergies:   Levofloxacin and Sulfa antibiotics    Social History:  The patient  reports that he has quit smoking. He does not have any smokeless tobacco history on file. He reports that he drinks about 1.2 oz of alcohol per week. He reports that he does not use illicit drugs.   Family History:  The patient's family history includes Diabetes type II in his mother; Heart disease in his father and mother; Hypertension in his father, mother, and another family member.    ROS:  Please see the history of present illness.     Patient denies fever, chills, headache, sweats, rash, change in vision, change in hearing, chest pain, cough, nausea or vomiting, urinary symptoms. All other systems are reviewed and are  negative.   PHYSICAL EXAM: VS:  BP 140/60 mmHg  Pulse 79  Ht 5\' 10"  (1.778 m)  Wt 185 lb 6.4 oz (84.097 kg)  BMI 26.60 kg/m2 , Patient is walking with a walker. He actually looks good based on his recent history. He is oriented to person time and place. Affect is normal. Head is atraumatic. Sclera and conjunctiva are normal. There is no jugular venous distention. Lungs are clear. Respiratory  effort is not labored. Cardiac exam reveals an S1 and S2. There is crisp ultrasound of his aortic prosthesis. Abdomen is soft. There is no peripheral edema. There are no musculoskeletal deformities. There are no skin rashes. Neurologic is grossly intact.  EKG:   EKG is not done today.   Recent Labs: 04/17/2015: Magnesium 2.0 05/23/2015: ALT 5*; BUN 14; Creatinine, Ser 0.72; Hemoglobin 9.2*; Platelets 480*; Potassium 4.0; Sodium 131*    Lipid Panel No results found for: CHOL, TRIG, HDL, CHOLHDL, VLDL, LDLCALC, LDLDIRECT    Wt Readings from Last 3 Encounters:  05/26/15 185 lb 6.4 oz (84.097 kg)  04/16/15 198 lb 13.7 oz (90.2 kg)  04/02/15 190 lb (86.183 kg)      Current medicines are reviewed  The patient understands his medications.   ASSESSMENT AND PLAN:

## 2015-05-26 NOTE — Assessment & Plan Note (Signed)
The patient had mild coronary disease by cath, 2011. No further workup.

## 2015-05-26 NOTE — Assessment & Plan Note (Signed)
This has been treated successfully since his hospitalization in May, 2016

## 2015-05-30 ENCOUNTER — Other Ambulatory Visit: Payer: Self-pay | Admitting: Infectious Diseases

## 2015-05-30 ENCOUNTER — Ambulatory Visit
Admission: RE | Admit: 2015-05-30 | Discharge: 2015-05-30 | Disposition: A | Discharge status: Home / Self Care | Payer: Medicare Other | Source: Ambulatory Visit | Attending: Infectious Diseases | Admitting: Infectious Diseases

## 2015-05-30 DIAGNOSIS — R600 Localized edema: Secondary | ICD-10-CM

## 2015-05-30 DIAGNOSIS — Z952 Presence of prosthetic heart valve: Secondary | ICD-10-CM | POA: Diagnosis present

## 2015-05-30 DIAGNOSIS — R7881 Bacteremia: Secondary | ICD-10-CM | POA: Diagnosis present

## 2015-05-30 DIAGNOSIS — Z86718 Personal history of other venous thrombosis and embolism: Secondary | ICD-10-CM | POA: Diagnosis not present

## 2015-06-07 ENCOUNTER — Ambulatory Visit (INDEPENDENT_AMBULATORY_CARE_PROVIDER_SITE_OTHER): Payer: Medicare Other | Admitting: *Deleted

## 2015-06-07 DIAGNOSIS — I359 Nonrheumatic aortic valve disorder, unspecified: Secondary | ICD-10-CM

## 2015-06-07 DIAGNOSIS — Z954 Presence of other heart-valve replacement: Secondary | ICD-10-CM | POA: Diagnosis not present

## 2015-06-07 DIAGNOSIS — I350 Nonrheumatic aortic (valve) stenosis: Secondary | ICD-10-CM | POA: Diagnosis not present

## 2015-06-07 DIAGNOSIS — Z952 Presence of prosthetic heart valve: Secondary | ICD-10-CM

## 2015-06-07 DIAGNOSIS — Z7901 Long term (current) use of anticoagulants: Secondary | ICD-10-CM

## 2015-06-07 LAB — POCT INR: INR: 2.1

## 2015-06-14 ENCOUNTER — Other Ambulatory Visit: Payer: Self-pay | Admitting: Internal Medicine

## 2015-06-14 ENCOUNTER — Ambulatory Visit
Admission: RE | Admit: 2015-06-14 | Discharge: 2015-06-14 | Disposition: A | Discharge status: Home / Self Care | Payer: Medicare Other | Source: Ambulatory Visit | Attending: Internal Medicine | Admitting: Internal Medicine

## 2015-06-14 DIAGNOSIS — R600 Localized edema: Secondary | ICD-10-CM

## 2015-06-19 ENCOUNTER — Other Ambulatory Visit: Payer: Self-pay | Admitting: Cardiology

## 2015-06-20 ENCOUNTER — Other Ambulatory Visit: Payer: Self-pay

## 2015-06-20 MED ORDER — AMLODIPINE BESYLATE 10 MG PO TABS: ORAL_TABLET | ORAL | Status: DC

## 2015-06-28 ENCOUNTER — Ambulatory Visit (INDEPENDENT_AMBULATORY_CARE_PROVIDER_SITE_OTHER): Payer: Medicare Other | Admitting: *Deleted

## 2015-06-28 DIAGNOSIS — Z7901 Long term (current) use of anticoagulants: Secondary | ICD-10-CM | POA: Diagnosis not present

## 2015-06-28 DIAGNOSIS — Z954 Presence of other heart-valve replacement: Secondary | ICD-10-CM | POA: Diagnosis not present

## 2015-06-28 DIAGNOSIS — I350 Nonrheumatic aortic (valve) stenosis: Secondary | ICD-10-CM | POA: Diagnosis not present

## 2015-06-28 DIAGNOSIS — I359 Nonrheumatic aortic valve disorder, unspecified: Secondary | ICD-10-CM | POA: Diagnosis not present

## 2015-06-28 DIAGNOSIS — Z952 Presence of prosthetic heart valve: Secondary | ICD-10-CM

## 2015-06-28 LAB — POCT INR: INR: 1.5

## 2015-07-12 ENCOUNTER — Ambulatory Visit (INDEPENDENT_AMBULATORY_CARE_PROVIDER_SITE_OTHER): Payer: Medicare Other

## 2015-07-12 DIAGNOSIS — Z7901 Long term (current) use of anticoagulants: Secondary | ICD-10-CM

## 2015-07-12 DIAGNOSIS — Z952 Presence of prosthetic heart valve: Secondary | ICD-10-CM

## 2015-07-12 DIAGNOSIS — I359 Nonrheumatic aortic valve disorder, unspecified: Secondary | ICD-10-CM | POA: Diagnosis not present

## 2015-07-12 DIAGNOSIS — I350 Nonrheumatic aortic (valve) stenosis: Secondary | ICD-10-CM | POA: Diagnosis not present

## 2015-07-12 DIAGNOSIS — Z954 Presence of other heart-valve replacement: Secondary | ICD-10-CM

## 2015-07-12 LAB — POCT INR: INR: 1.5

## 2015-07-26 ENCOUNTER — Ambulatory Visit (INDEPENDENT_AMBULATORY_CARE_PROVIDER_SITE_OTHER): Payer: Medicare Other

## 2015-07-26 DIAGNOSIS — I350 Nonrheumatic aortic (valve) stenosis: Secondary | ICD-10-CM

## 2015-07-26 DIAGNOSIS — Z954 Presence of other heart-valve replacement: Secondary | ICD-10-CM

## 2015-07-26 DIAGNOSIS — Z7901 Long term (current) use of anticoagulants: Secondary | ICD-10-CM

## 2015-07-26 DIAGNOSIS — I359 Nonrheumatic aortic valve disorder, unspecified: Secondary | ICD-10-CM | POA: Diagnosis not present

## 2015-07-26 DIAGNOSIS — Z952 Presence of prosthetic heart valve: Secondary | ICD-10-CM

## 2015-07-26 LAB — POCT INR: INR: 1.7

## 2015-07-27 ENCOUNTER — Telehealth: Payer: Self-pay

## 2015-07-27 NOTE — Telephone Encounter (Signed)
Pt c/o Shortness Of Breath: STAT if SOB developed within the last 24 hours or pt is noticeably SOB on the phone  1. Are you currently SOB (can you hear that pt is SOB on the phone)? no  2. How long have you been experiencing SOB? A month  3. Are you SOB when sitting or when up moving around? moving  4. Are you currently experiencing any other symptoms? No

## 2015-07-27 NOTE — Telephone Encounter (Signed)
Pt of Dr. Myrtis Ser. Routed to [place] office.

## 2015-07-27 NOTE — Telephone Encounter (Signed)
Spoke with pt. Pt is aware that he can be seen today a 2:30 PM. Pt states is not able to come because he has  another doctor at 2:00 PM today. Pt wants to know if we can be seen tomorrow. Pt was made aware that we do not have any opening until the end of the month. Pt would like to see if he can be seen in [place] tomorrow or  In 1 to 2 weeks. Spoke with [place] [facility] in [place]. An appointment was made for pt on Tuesday September 13 th at 3:00 PM with Dr. Kirke Corin. Pt is aware of appointment.

## 2015-07-27 NOTE — Telephone Encounter (Signed)
Spoke with pt he states has been having SOB for a month when  he pushes his wife on the wheelchair. Pt has had LE edema since April this year pt states it feels tight.  But the SOB is new. Pt takes lasix 20 mg as needed he took one tablet today. Pt  has been taking the furosemide 20 mg every 2 to 3 days. When he does take the lasix  his LE get soft.

## 2015-07-31 ENCOUNTER — Encounter: Payer: Self-pay | Admitting: Emergency Medicine

## 2015-07-31 ENCOUNTER — Inpatient Hospital Stay
Admission: EM | Admit: 2015-07-31 | Discharge: 2015-08-07 | DRG: 378 | Disposition: A | Discharge status: Home / Self Care | Payer: Medicare Other | Attending: Internal Medicine | Admitting: Internal Medicine

## 2015-07-31 ENCOUNTER — Emergency Department: Payer: Medicare Other

## 2015-07-31 DIAGNOSIS — K449 Diaphragmatic hernia without obstruction or gangrene: Secondary | ICD-10-CM | POA: Diagnosis present

## 2015-07-31 DIAGNOSIS — I2510 Atherosclerotic heart disease of native coronary artery without angina pectoris: Secondary | ICD-10-CM | POA: Diagnosis present

## 2015-07-31 DIAGNOSIS — Z888 Allergy status to other drugs, medicaments and biological substances status: Secondary | ICD-10-CM

## 2015-07-31 DIAGNOSIS — Z7901 Long term (current) use of anticoagulants: Secondary | ICD-10-CM | POA: Diagnosis not present

## 2015-07-31 DIAGNOSIS — Z79899 Other long term (current) drug therapy: Secondary | ICD-10-CM | POA: Diagnosis not present

## 2015-07-31 DIAGNOSIS — K922 Gastrointestinal hemorrhage, unspecified: Secondary | ICD-10-CM | POA: Diagnosis present

## 2015-07-31 DIAGNOSIS — Z882 Allergy status to sulfonamides status: Secondary | ICD-10-CM | POA: Diagnosis not present

## 2015-07-31 DIAGNOSIS — Z7189 Other specified counseling: Secondary | ICD-10-CM

## 2015-07-31 DIAGNOSIS — Z9889 Other specified postprocedural states: Secondary | ICD-10-CM

## 2015-07-31 DIAGNOSIS — F329 Major depressive disorder, single episode, unspecified: Secondary | ICD-10-CM | POA: Diagnosis present

## 2015-07-31 DIAGNOSIS — Z954 Presence of other heart-valve replacement: Secondary | ICD-10-CM | POA: Diagnosis not present

## 2015-07-31 DIAGNOSIS — K2970 Gastritis, unspecified, without bleeding: Secondary | ICD-10-CM | POA: Diagnosis present

## 2015-07-31 DIAGNOSIS — N400 Enlarged prostate without lower urinary tract symptoms: Secondary | ICD-10-CM | POA: Diagnosis present

## 2015-07-31 DIAGNOSIS — K254 Chronic or unspecified gastric ulcer with hemorrhage: Secondary | ICD-10-CM

## 2015-07-31 DIAGNOSIS — F419 Anxiety disorder, unspecified: Secondary | ICD-10-CM | POA: Diagnosis present

## 2015-07-31 DIAGNOSIS — Z8711 Personal history of peptic ulcer disease: Secondary | ICD-10-CM | POA: Diagnosis not present

## 2015-07-31 DIAGNOSIS — Z833 Family history of diabetes mellitus: Secondary | ICD-10-CM | POA: Diagnosis not present

## 2015-07-31 DIAGNOSIS — I10 Essential (primary) hypertension: Secondary | ICD-10-CM | POA: Diagnosis present

## 2015-07-31 DIAGNOSIS — Z952 Presence of prosthetic heart valve: Secondary | ICD-10-CM | POA: Diagnosis not present

## 2015-07-31 DIAGNOSIS — Z7982 Long term (current) use of aspirin: Secondary | ICD-10-CM

## 2015-07-31 DIAGNOSIS — K219 Gastro-esophageal reflux disease without esophagitis: Secondary | ICD-10-CM | POA: Diagnosis present

## 2015-07-31 DIAGNOSIS — K625 Hemorrhage of anus and rectum: Secondary | ICD-10-CM

## 2015-07-31 DIAGNOSIS — E871 Hypo-osmolality and hyponatremia: Secondary | ICD-10-CM | POA: Diagnosis present

## 2015-07-31 DIAGNOSIS — D649 Anemia, unspecified: Secondary | ICD-10-CM

## 2015-07-31 DIAGNOSIS — G2581 Restless legs syndrome: Secondary | ICD-10-CM | POA: Diagnosis present

## 2015-07-31 DIAGNOSIS — K5900 Constipation, unspecified: Secondary | ICD-10-CM | POA: Diagnosis present

## 2015-07-31 DIAGNOSIS — Z8249 Family history of ischemic heart disease and other diseases of the circulatory system: Secondary | ICD-10-CM | POA: Diagnosis not present

## 2015-07-31 DIAGNOSIS — Z87891 Personal history of nicotine dependence: Secondary | ICD-10-CM | POA: Diagnosis not present

## 2015-07-31 DIAGNOSIS — D500 Iron deficiency anemia secondary to blood loss (chronic): Secondary | ICD-10-CM | POA: Diagnosis present

## 2015-07-31 DIAGNOSIS — K921 Melena: Secondary | ICD-10-CM | POA: Diagnosis present

## 2015-07-31 DIAGNOSIS — Z9660 Presence of unspecified orthopedic joint implant: Secondary | ICD-10-CM | POA: Diagnosis present

## 2015-07-31 HISTORY — DX: Enterocolitis due to Clostridium difficile, not specified as recurrent: A04.72

## 2015-07-31 HISTORY — DX: Gastric ulcer, unspecified as acute or chronic, without hemorrhage or perforation: K25.9

## 2015-07-31 LAB — CBC WITH DIFFERENTIAL/PLATELET
Basophils Absolute: 0 10*3/uL (ref 0–0.1)
Basophils Relative: 1 %
EOS ABS: 0.1 10*3/uL (ref 0–0.7)
EOS PCT: 1 %
HCT: 21.6 % — ABNORMAL LOW (ref 40.0–52.0)
Hemoglobin: 6.7 g/dL — ABNORMAL LOW (ref 13.0–18.0)
Lymphocytes Relative: 15 %
Lymphs Abs: 0.9 10*3/uL — ABNORMAL LOW (ref 1.0–3.6)
MCH: 20.9 pg — ABNORMAL LOW (ref 26.0–34.0)
MCHC: 31.2 g/dL — ABNORMAL LOW (ref 32.0–36.0)
MCV: 66.9 fL — AB (ref 80.0–100.0)
MONO ABS: 0.6 10*3/uL (ref 0.2–1.0)
Monocytes Relative: 10 %
Neutro Abs: 4.2 10*3/uL (ref 1.4–6.5)
Neutrophils Relative %: 73 %
PLATELETS: 380 10*3/uL (ref 150–440)
RBC: 3.22 MIL/uL — ABNORMAL LOW (ref 4.40–5.90)
RDW: 18.7 % — ABNORMAL HIGH (ref 11.5–14.5)
WBC: 5.8 10*3/uL (ref 3.8–10.6)

## 2015-07-31 LAB — COMPREHENSIVE METABOLIC PANEL
ALT: 17 U/L (ref 17–63)
AST: 27 U/L (ref 15–41)
Albumin: 4.1 g/dL (ref 3.5–5.0)
Alkaline Phosphatase: 82 U/L (ref 38–126)
Anion gap: 7 (ref 5–15)
BUN: 23 mg/dL — AB (ref 6–20)
CHLORIDE: 100 mmol/L — AB (ref 101–111)
CO2: 25 mmol/L (ref 22–32)
Calcium: 8.8 mg/dL — ABNORMAL LOW (ref 8.9–10.3)
Creatinine, Ser: 0.92 mg/dL (ref 0.61–1.24)
GFR calc Af Amer: >60 mL/min (ref >60)
Glucose, Bld: 122 mg/dL — ABNORMAL HIGH (ref 65–99)
POTASSIUM: 4.3 mmol/L (ref 3.5–5.1)
SODIUM: 132 mmol/L — AB (ref 135–145)
Total Bilirubin: 1 mg/dL (ref 0.3–1.2)
Total Protein: 6.8 g/dL (ref 6.5–8.1)

## 2015-07-31 LAB — HEMOGLOBIN AND HEMATOCRIT, BLOOD
HEMATOCRIT: 20 % — AB (ref 40.0–52.0)
HEMOGLOBIN: 6.3 g/dL — AB (ref 13.0–18.0)

## 2015-07-31 LAB — PROTIME-INR
INR: 1.96
Prothrombin Time: 22.5 seconds — ABNORMAL HIGH (ref 11.4–15.0)

## 2015-07-31 LAB — BRAIN NATRIURETIC PEPTIDE: B Natriuretic Peptide: 574 pg/mL — ABNORMAL HIGH (ref 0.0–100.0)

## 2015-07-31 LAB — TROPONIN I

## 2015-07-31 LAB — PREPARE RBC (CROSSMATCH)

## 2015-07-31 MED ORDER — ACETAMINOPHEN 325 MG PO TABS: 650.0000 mg | ORAL_TABLET | Freq: Four times a day (QID) | ORAL | Status: DC | PRN

## 2015-07-31 MED ORDER — ONDANSETRON HCL 4 MG PO TABS
4.0000 mg | ORAL_TABLET | Freq: Four times a day (QID) | ORAL | Status: DC | PRN
Start: 2015-07-31 — End: 2015-08-07
  Administered 2015-08-01: 4 mg via ORAL
  Filled 2015-07-31: qty 1

## 2015-07-31 MED ORDER — ROPINIROLE HCL 1 MG PO TABS
1.0000 mg | ORAL_TABLET | Freq: Three times a day (TID) | ORAL | Status: DC
  Administered 2015-07-31 (×2): 1 mg via ORAL
  Filled 2015-07-31 (×2): qty 1

## 2015-07-31 MED ORDER — HYDROCODONE-ACETAMINOPHEN 5-325 MG PO TABS
1.0000 | ORAL_TABLET | ORAL | Status: DC | PRN
  Administered 2015-07-31 – 2015-08-01 (×2): 2 via ORAL
  Administered 2015-08-02: 1 via ORAL
  Administered 2015-08-02: 2 via ORAL
  Administered 2015-08-02 – 2015-08-03 (×2): 1 via ORAL
  Filled 2015-07-31: qty 1
  Filled 2015-07-31: qty 2
  Filled 2015-07-31: qty 1
  Filled 2015-07-31: qty 2
  Filled 2015-07-31: qty 1
  Filled 2015-07-31: qty 2

## 2015-07-31 MED ORDER — ONDANSETRON HCL 4 MG/2ML IJ SOLN
4.0000 mg | Freq: Four times a day (QID) | INTRAMUSCULAR | Status: DC | PRN
  Administered 2015-08-02 – 2015-08-03 (×3): 4 mg via INTRAVENOUS
  Filled 2015-07-31 (×3): qty 2

## 2015-07-31 MED ORDER — ROPINIROLE HCL 1 MG PO TABS
1.0000 mg | ORAL_TABLET | Freq: Four times a day (QID) | ORAL | Status: DC | PRN
  Administered 2015-08-01 (×2): 1 mg via ORAL
  Filled 2015-07-31 (×2): qty 1

## 2015-07-31 MED ORDER — TAMSULOSIN HCL 0.4 MG PO CAPS
0.4000 mg | ORAL_CAPSULE | Freq: Every day | ORAL | Status: DC
  Administered 2015-07-31 – 2015-08-06 (×7): 0.4 mg via ORAL
  Filled 2015-07-31 (×7): qty 1

## 2015-07-31 MED ORDER — PANTOPRAZOLE SODIUM 40 MG IV SOLR
40.0000 mg | Freq: Two times a day (BID) | INTRAVENOUS | Status: DC
  Administered 2015-07-31 – 2015-08-02 (×5): 40 mg via INTRAVENOUS
  Filled 2015-07-31 (×4): qty 40

## 2015-07-31 MED ORDER — SENNOSIDES-DOCUSATE SODIUM 8.6-50 MG PO TABS: 1.0000 | ORAL_TABLET | Freq: Every evening | ORAL | Status: DC | PRN

## 2015-07-31 MED ORDER — FINASTERIDE 5 MG PO TABS
5.0000 mg | ORAL_TABLET | Freq: Every day | ORAL | Status: DC
  Administered 2015-08-01 – 2015-08-07 (×7): 5 mg via ORAL
  Filled 2015-07-31 (×7): qty 1

## 2015-07-31 MED ORDER — ALUM & MAG HYDROXIDE-SIMETH 200-200-20 MG/5ML PO SUSP
30.0000 mL | Freq: Four times a day (QID) | ORAL | Status: DC | PRN
Start: 2015-07-31 — End: 2015-08-07

## 2015-07-31 MED ORDER — ADULT MULTIVITAMIN W/MINERALS CH
1.0000 | ORAL_TABLET | Freq: Every day | ORAL | Status: DC
  Administered 2015-08-01 – 2015-08-07 (×6): 1 via ORAL
  Filled 2015-07-31 (×6): qty 1

## 2015-07-31 MED ORDER — TRAZODONE HCL 50 MG PO TABS
50.0000 mg | ORAL_TABLET | Freq: Every day | ORAL | Status: DC
  Administered 2015-07-31 – 2015-08-06 (×7): 50 mg via ORAL
  Filled 2015-07-31 (×7): qty 1

## 2015-07-31 MED ORDER — ROPINIROLE HCL 1 MG PO TABS: 1.0000 mg | ORAL_TABLET | Freq: Three times a day (TID) | ORAL | Status: DC

## 2015-07-31 MED ORDER — IRBESARTAN 75 MG PO TABS
37.5000 mg | ORAL_TABLET | Freq: Every day | ORAL | Status: DC
  Administered 2015-08-01 – 2015-08-07 (×6): 37.5 mg via ORAL
  Filled 2015-07-31 (×7): qty 1

## 2015-07-31 MED ORDER — SODIUM CHLORIDE 0.9 % IV SOLN
10.0000 mL/h | Freq: Once | INTRAVENOUS | Status: AC
  Administered 2015-07-31: 10 mL/h via INTRAVENOUS

## 2015-07-31 MED ORDER — SODIUM CHLORIDE 0.9 % IV SOLN
INTRAVENOUS | Status: DC
  Administered 2015-07-31 – 2015-08-03 (×4): via INTRAVENOUS

## 2015-07-31 MED ORDER — CITALOPRAM HYDROBROMIDE 20 MG PO TABS
20.0000 mg | ORAL_TABLET | Freq: Every day | ORAL | Status: DC
  Administered 2015-08-01 – 2015-08-07 (×7): 20 mg via ORAL
  Filled 2015-07-31 (×7): qty 1

## 2015-07-31 MED ORDER — ACETAMINOPHEN 650 MG RE SUPP: 650.0000 mg | Freq: Four times a day (QID) | RECTAL | Status: DC | PRN

## 2015-07-31 MED ORDER — ACETAMINOPHEN 500 MG PO TABS
500.0000 mg | ORAL_TABLET | ORAL | Status: DC | PRN
  Administered 2015-08-03 – 2015-08-06 (×4): 500 mg via ORAL
  Filled 2015-07-31 (×4): qty 1

## 2015-07-31 MED ORDER — BISACODYL 5 MG PO TBEC
5.0000 mg | DELAYED_RELEASE_TABLET | Freq: Every day | ORAL | Status: DC | PRN
  Administered 2015-08-03 – 2015-08-06 (×3): 5 mg via ORAL
  Filled 2015-07-31 (×3): qty 1

## 2015-07-31 MED ORDER — ALPRAZOLAM 0.25 MG PO TABS
0.1250 mg | ORAL_TABLET | Freq: Every day | ORAL | Status: DC | PRN
  Administered 2015-08-01 – 2015-08-06 (×3): 0.125 mg via ORAL
  Filled 2015-07-31 (×3): qty 1

## 2015-07-31 MED ORDER — AMLODIPINE BESYLATE 10 MG PO TABS
10.0000 mg | ORAL_TABLET | Freq: Every day | ORAL | Status: DC
  Administered 2015-08-01 – 2015-08-07 (×7): 10 mg via ORAL
  Filled 2015-07-31 (×7): qty 1

## 2015-07-31 NOTE — Consult Note (Signed)
Cardiology Consultation Note  Patient ID: [name], MRN: [id number], DOB/AGE: January 18, 1931 81 y.o. Admit date: 07/31/2015   Date of Consult: 07/31/2015 Primary Physician: [name], MD Primary Cardiologist: Willa Rough  Chief Complaint: SOB, weakness Reason for Consult: management of anticoagulation, mechanical aortic valve  HPI: [name] is a 81 y.o. male h/o aortic valvular disease, mechanical valve placed in the aortic position in 2002,  good LV function and good function of his valve, mild CAD by cath 2011, h/o Go bleed at the end of 2015, C. difficile, MRSA bacteremia, now presenting with shortness of breath. Cardiology was consult in for management of his anticoagulation in the setting of GI bleed.  He reports having dark stools over the past several days. Nothing dramatic, denies any pain, no nausea or vomiting. Previously seen by Dr. Mechele Collin. Notes indicate prior history of ulcer. Currently not on a proton pump anemia. He takes Zantac sometimes. Also on aspirin daily, takes Celebrex when necessary  INR 1.9 Hematocrit 21  He continues to have tremendous difficulty with his restless leg syndrome. Takes requip  1 mg up to 5 times per day Scheduled to see neurology middle of October   Past Medical History  Diagnosis Date  . GERD (gastroesophageal reflux disease)   . Bradycardia     chronic, no symptoms 07/2010  . Depression   . Anxiety 10/11  . SOB (shortness of breath) 10/11    08/2010,Episodes at 5 AM, eventually felt to be anxiety, after complete workup including catheterization, pt greatly improved with anxiety meds 11/11  . Hypertension     BP higher than usual 04/19/10; amlodipine increased by telephone  . Decreased hearing     Right ear  . Coronary artery disease     mild, cath, 08/2010  . Aortic stenosis     a. s/p mechcanical AVR, 2002  . S/P AVR     a. St. Jude. mechanical 2002; b. echo 08/2010 EF 60%, trival AI, mild MR, AVR  working well; c. on longterm warfarin tx  . Carotid bruit     dopplers in past, no abnormalities  . Gastric ulcer   . C. difficile colitis       Most Recent Cardiac Studies:    Surgical History:  Past Surgical History  Procedure Laterality Date  . Valve replacement  1/02    Aortic; echo 3/09 valve working well; echo 10/11 working well; put on Coumadin  . Cardiac catheterization    . Joint replacement    . Hernia repair    . Esophagogastroduodenoscopy N/A 04/05/2015    Procedure: ESOPHAGOGASTRODUODENOSCOPY (EGD);  Surgeon: Scot Jun, MD;  Location: [facility] ENDOSCOPY;  Service: Endoscopy;  Laterality: N/A;  . Esophagogastroduodenoscopy N/A 04/17/2015    Procedure: ESOPHAGOGASTRODUODENOSCOPY (EGD);  Surgeon: Scot Jun, MD;  Location: [facility] ENDOSCOPY;  Service: Endoscopy;  Laterality: N/A;     Home Meds: Prior to Admission medications   Medication Sig Start Date End Date Taking? Authorizing Provider  acetaminophen (TYLENOL) 500 MG tablet Take 500 mg by mouth every 4 (four) hours as needed for mild pain or headache.    Yes Historical Provider, MD  ALPRAZolam Prudy Feeler) 0.5 MG tablet Take 0.5 tablets (0.25 mg total) by mouth daily as needed for anxiety. Patient taking differently: Take 0.5 mg by mouth daily as needed for anxiety.  04/20/15  Yes Auburn Bilberry, MD  amLODipine (NORVASC) 10 MG tablet Take 1 tablet by mouth  daily Patient taking differently: Take 10  mg by mouth daily.  06/20/15  Yes Luis Abed, MD  aspirin EC 81 MG tablet Take 81 mg by mouth daily.   Yes Historical Provider, MD  celecoxib (CELEBREX) 200 MG capsule Take 200 mg by mouth daily as needed for moderate pain.   Yes Historical Provider, MD  citalopram (CELEXA) 20 MG tablet Take 20 mg by mouth daily.   Yes Historical Provider, MD  finasteride (PROSCAR) 5 MG tablet Take 5 mg by mouth daily.     Yes Historical Provider, MD  furosemide (LASIX) 20 MG tablet Take 20 mg by mouth daily as needed for edema.   Yes  Historical Provider, MD  Multiple Vitamin (MULTIVITAMIN WITH MINERALS) TABS tablet Take 1 tablet by mouth daily.   Yes Historical Provider, MD  ranitidine (ZANTAC) 150 MG tablet Take 150 mg by mouth daily as needed for heartburn.   Yes Historical Provider, MD  rOPINIRole (REQUIP) 1 MG tablet Take 1 tablet (1 mg total) by mouth 3 (three) times daily. Patient taking differently: Take 1 mg by mouth 5 (five) times daily.  04/13/15  Yes Vipul Sherryll Burger, MD  Tamsulosin HCl (FLOMAX) 0.4 MG CAPS Take 0.4 mg by mouth at bedtime.    Yes Historical Provider, MD  traZODone (DESYREL) 50 MG tablet Take 50 mg by mouth at bedtime.   Yes Historical Provider, MD  valsartan (DIOVAN) 320 MG tablet Take 320 mg by mouth daily.     Yes Historical Provider, MD  warfarin (COUMADIN) 5 MG tablet Take 5-7.5 mg by mouth at bedtime. Pt takes 7.5mg  on Monday, Wednesday, and Friday.   Pt takes  all other days.   Yes Historical Provider, MD    Inpatient Medications:  . sodium chloride  10 mL/hr Intravenous Once  . [START ON 08/01/2015] amLODipine  10 mg Oral Daily  . [START ON 08/01/2015] citalopram  20 mg Oral Daily  . [START ON 08/01/2015] finasteride  5 mg Oral Daily  . [START ON 08/01/2015] irbesartan  37.5 mg Oral Daily  . multivitamin with minerals  1 tablet Oral Daily  . pantoprazole (PROTONIX) IV  40 mg Intravenous Q12H  . rOPINIRole  1 mg Oral TID  . tamsulosin  0.4 mg Oral QHS  . traZODone  50 mg Oral QHS   . sodium chloride      Allergies:  Allergies  Allergen Reactions  . Levofloxacin Nausea Only  . Sulfa Antibiotics Other (See Comments)    Reaction:  Unknown     Social History   Social History  . Marital Status: Married    Spouse Name: N/A  . Number of Children: N/A  . Years of Education: N/A   Occupational History  . Retired    Social History Main Topics  . Smoking status: Former Games developer  . Smokeless tobacco: Not on file  . Alcohol Use: 1.2 oz/week    2 Glasses of wine, 0 Standard drinks or  equivalent per week     Comment: Social  . Drug Use: No  . Sexual Activity: Not on file   Other Topics Concern  . Not on file   Social History Narrative   No regular exercise.     Family History  Problem Relation Age of Onset  . Hypertension    . Hypertension Mother   . Diabetes type II Mother   . Heart disease Mother   . Hypertension Father   . Heart disease Father      Review of Systems: Review of Systems  Constitutional: Positive for malaise/fatigue.       Pale  Respiratory: Positive for shortness of breath.   Cardiovascular: Negative.   Gastrointestinal: Negative.   Musculoskeletal: Negative.   Neurological: Positive for weakness.       Restless leg movements  Psychiatric/Behavioral: Negative.   All other systems reviewed and are negative.    Labs:  Recent Labs  07/31/15 0917  TROPONINI <0.03   Lab Results  Component Value Date   WBC 5.8 07/31/2015   HGB 6.7* 07/31/2015   HCT 21.6* 07/31/2015   MCV 66.9* 07/31/2015   PLT 380 07/31/2015    Recent Labs Lab 07/31/15 0917  NA 132*  K 4.3  CL 100*  CO2 25  BUN 23*  CREATININE 0.92  CALCIUM 8.8*  PROT 6.8  BILITOT 1.0  ALKPHOS 82  ALT 17  AST 27  GLUCOSE 122*   No results found for: CHOL, HDL, LDLCALC, TRIG No results found for: DDIMER  Radiology/Studies:  Dg Chest 2 View  07/31/2015    IMPRESSION: 1. Mild diffuse interstitial prominence and peribronchial cuffing suggestive of acute bronchitis. 2. Small right pleural effusion. 3. Atherosclerosis. 4. Moderate to large hiatal hernia.   Electronically Signed   By: Trudie Reed M.D.   On: 07/31/2015 09:46    EKG: NSR with no significant ST or T-wave changes, rare APC  Weights: Filed Weights   07/31/15 0902 07/31/15 0916 07/31/15 1311  Weight: 200 lb (90.719 kg) 200 lb (90.719 kg) 200 lb (90.719 kg)     Physical Exam: Blood pressure 151/64, pulse 87, temperature 97.9 F (36.6 C), temperature source Oral, resp. rate 18, height 5\' 11"   (1.803 m), weight 200 lb (90.719 kg), SpO2 100 %. Body mass index is 27.91 kg/(m^2). General: Well developed, well nourished, in no acute distress.Appears pale Head: Normocephalic, atraumatic, sclera non-icteric, no xanthomas, nares are without discharge.  Neck: Negative for carotid bruits. JVD not elevated. Lungs: Clear bilaterally to auscultation without wheezes, rales, or rhonchi. Breathing is unlabored. Heart: RRR with S1 S2. No murmurs, rubs, or gallops appreciated.rare ectopy Murmur, II/VI Abdomen: Soft, non-tender, non-distended with normoactive bowel sounds. No hepatomegaly. No rebound/guarding. No obvious abdominal masses. Msk:  Strength and tone appear normal for age. Extremities: No clubbing or cyanosis. No edema.  Distal pedal pulses are 2+ and equal bilaterally. Neuro: Alert and oriented X 3. No facial asymmetry. No focal deficit. Moves all extremities spontaneously. Psych:  Responds to questions appropriately with a normal affect.    Assessment and Plan:  1) GI bleed Suspect upper GI bleed given prior history, melena Not on a PPI ---Would hold aspirin, celebrex --start PPI, high dose BID --hold warfarin for now --He is schedule to received blood 2 units  2) Symptomatic blood loss Anemia: HCT 21  SOB secondary to anemia --transfusion as above  3) s/p AVR INR 1.9, No echo needed With HBG 6.7, there is little buffer to continue anticoagulation, Would hold warfarin for now, Readdress once HBG higher   4) CAD Mild disease Stable  5. Essential hypertension:  continue outpatient medications including Norvasc and valsartan.  6. Depression/anxiety:  Continue Xanax and citalopram.  7. BPH:  Continue Flomax and Proscar.  8. Restless leg syndrome:  Continue Requip, he takes 1 mg 5 times per day (even takes a dose at 2 Am)   Signed, Dossie Arbour, MD [facility] 07/31/2015, 2:02 PM

## 2015-07-31 NOTE — ED Notes (Signed)
Patient is resting comfortably, denies complaints.

## 2015-07-31 NOTE — H&P (Signed)
[facility] - [place] at [facility]   PATIENT NAME: [name]    MR#:  [id number]  DATE OF BIRTH:  Jun 15, 1931  DATE OF ADMISSION:  07/31/2015  PRIMARY CARE PHYSICIAN: [name], MD   REQUESTING/REFERRING PHYSICIAN:  Dr Langston Masker  CHIEF COMPLAINT:  Shortness of breath HISTORY OF PRESENT ILLNESS:  [name]  is a 81 y.o. male with a known history of gastric ulcer, aortic valve replacement on Coumadin and essential hypertension who presents with above complaint. Patient reports over the past few days he's had increasing shortness of breath, dyspnea exertion and noticed melanoma 3 days ago. In the emergency room he was noted to have a low hemoglobin. ER physician ordered 2 units of PRBCs. Patient was admitted in June with GI bleed secondary to gastric ulcer with visible vessel.  PAST MEDICAL HISTORY:   Past Medical History  Diagnosis Date  . GERD (gastroesophageal reflux disease)   . Bradycardia     chronic, no symptoms 07/2010  . Depression   . Anxiety 10/11  . SOB (shortness of breath) 10/11    08/2010,Episodes at 5 AM, eventually felt to be anxiety, after complete workup including catheterization, pt greatly improved with anxiety meds 11/11  . Hypertension     BP higher than usual 04/19/10; amlodipine increased by telephone  . Decreased hearing     Right ear  . Coronary artery disease     mild, cath, 08/2010  . Aortic stenosis     a. s/p mechcanical AVR, 2002  . S/P AVR     a. St. Jude. mechanical 2002; b. echo 08/2010 EF 60%, trival AI, mild MR, AVR working well; c. on longterm warfarin tx  . Carotid bruit     dopplers in past, no abnormalities  . Gastric ulcer   . C. difficile colitis     PAST SURGICAL HISTORY:   Past Surgical History  Procedure Laterality Date  . Valve replacement  1/02    Aortic; echo 3/09 valve working well; echo 10/11 working well; put on Coumadin  . Cardiac catheterization    . Joint replacement    .  Hernia repair    . Esophagogastroduodenoscopy N/A 04/05/2015    Procedure: ESOPHAGOGASTRODUODENOSCOPY (EGD);  Surgeon: Scot Jun, MD;  Location: [facility] ENDOSCOPY;  Service: Endoscopy;  Laterality: N/A;  . Esophagogastroduodenoscopy N/A 04/17/2015    Procedure: ESOPHAGOGASTRODUODENOSCOPY (EGD);  Surgeon: Scot Jun, MD;  Location: [facility] ENDOSCOPY;  Service: Endoscopy;  Laterality: N/A;    SOCIAL HISTORY:   Social History  Substance Use Topics  . Smoking status: Former Games developer  . Smokeless tobacco: Not on file  . Alcohol Use: 1.2 oz/week    2 Glasses of wine, 0 Standard drinks or equivalent per week     Comment: Social    FAMILY HISTORY:   Family History  Problem Relation Age of Onset  . Hypertension    . Hypertension Mother   . Diabetes type II Mother   . Heart disease Mother   . Hypertension Father   . Heart disease Father     DRUG ALLERGIES:   Allergies  Allergen Reactions  . Levofloxacin Nausea Only  . Sulfa Antibiotics Other (See Comments)    Reaction:  Unknown      REVIEW OF SYSTEMS:  CONSTITUTIONAL: No fever,  positive fatigue and weakness.  EYES: No blurred or double vision.  EARS, NOSE, AND THROAT: No tinnitus or ear pain.  RESPIRATORY: No cough, positive  shortness of  breath,  no wheezing or hemoptysis.  CARDIOVASCULAR: No chest pain, orthopnea, edema.  positive dyspnea exertion  GASTROINTESTINAL: No nausea, vomiting, diarrhea., Positive epigastric abdominal pain.  positive melena GENITOURINARY: No dysuria, hematuria.  ENDOCRINE: No polyuria, nocturia,  HEMATOLOGY: positive anemia and easy bruising SKIN: No rash or lesion. MUSCULOSKELETAL: No joint pain or arthritis.   NEUROLOGIC: No tingling, numbness, weakness.  PSYCHIATRY: No anxiety or depression.   MEDICATIONS AT HOME:   Prior to Admission medications   Medication Sig Start Date End Date Taking? Authorizing Provider  acetaminophen (TYLENOL) 500 MG tablet Take 500 mg by mouth every 4 (four)  hours as needed for mild pain or headache.    Yes Historical Provider, MD  ALPRAZolam Prudy Feeler) 0.5 MG tablet Take 0.5 tablets (0.25 mg total) by mouth daily as needed for anxiety. Patient taking differently: Take 0.5 mg by mouth daily as needed for anxiety.  04/20/15  Yes Auburn Bilberry, MD  amLODipine (NORVASC) 10 MG tablet Take 1 tablet by mouth  daily Patient taking differently: Take 10 mg by mouth daily.  06/20/15  Yes Luis Abed, MD  aspirin EC 81 MG tablet Take 81 mg by mouth daily.   Yes Historical Provider, MD  celecoxib (CELEBREX) 200 MG capsule Take 200 mg by mouth daily as needed for moderate pain.   Yes Historical Provider, MD  citalopram (CELEXA) 20 MG tablet Take 20 mg by mouth daily.   Yes Historical Provider, MD  finasteride (PROSCAR) 5 MG tablet Take 5 mg by mouth daily.     Yes Historical Provider, MD  furosemide (LASIX) 20 MG tablet Take 20 mg by mouth daily as needed for edema.   Yes Historical Provider, MD  Multiple Vitamin (MULTIVITAMIN WITH MINERALS) TABS tablet Take 1 tablet by mouth daily.   Yes Historical Provider, MD  ranitidine (ZANTAC) 150 MG tablet Take 150 mg by mouth daily as needed for heartburn.   Yes Historical Provider, MD  rOPINIRole (REQUIP) 1 MG tablet Take 1 tablet (1 mg total) by mouth 3 (three) times daily. Patient taking differently: Take 1 mg by mouth 5 (five) times daily.  04/13/15  Yes Vipul Sherryll Burger, MD  Tamsulosin HCl (FLOMAX) 0.4 MG CAPS Take 0.4 mg by mouth at bedtime.    Yes Historical Provider, MD  traZODone (DESYREL) 50 MG tablet Take 50 mg by mouth at bedtime.   Yes Historical Provider, MD  valsartan (DIOVAN) 320 MG tablet Take 320 mg by mouth daily.     Yes Historical Provider, MD  warfarin (COUMADIN) 5 MG tablet Take 5-7.5 mg by mouth at bedtime. Pt takes 7.5mg  on Monday, Wednesday, and Friday.   Pt takes 5mg  all other days.   Yes Historical Provider, MD      VITAL SIGNS:  Blood pressure 135/58, pulse 87, temperature 97.9 F (36.6 C),  temperature source Oral, resp. rate 18, height 5\' 11"  (1.803 m), weight 90.719 kg (200 lb), SpO2 99 %.  PHYSICAL EXAMINATION:  GENERAL:  81 y.o.-year-old patient lying in the bed with no acute distress.  EYES: Pupils equal, round, reactive to light and accommodation. No scleral icterus. Extraocular muscles intact.  HEENT: Head atraumatic, normocephalic. Oropharynx and nasopharynx clear.  NECK:  Supple, no jugular venous distention. No thyroid enlargement, no tenderness.  LUNGS: Normal breath sounds bilaterally, no wheezing, rales,rhonchi or crepitation. No use of accessory muscles of respiration.  CARDIOVASCULAR: Patient has aortic valve click regular heart rhythm and rate.  ABDOMEN: Soft, mild tenderness epigastric region without rebound or guarding,  nondistended. Bowel sounds present. No organomegaly or mass.  EXTREMITIES: No pedal edema, cyanosis, or clubbing.  NEUROLOGIC: Cranial nerves II through XII are grossly intact. No focal deficits. PSYCHIATRIC: The patient is alert and oriented x 3.  SKIN: No obvious rash, lesion, or ulcer.   LABORATORY PANEL:   CBC  Recent Labs Lab 07/31/15 0917  WBC 5.8  HGB 6.7*  HCT 21.6*  PLT 380   ------------------------------------------------------------------------------------------------------------------  Chemistries   Recent Labs Lab 07/31/15 0917  NA 132*  K 4.3  CL 100*  CO2 25  GLUCOSE 122*  BUN 23*  CREATININE 0.92  CALCIUM 8.8*  AST 27  ALT 17  ALKPHOS 82  BILITOT 1.0   ------------------------------------------------------------------------------------------------------------------  Cardiac Enzymes  Recent Labs Lab 07/31/15 0917  TROPONINI <0.03   ------------------------------------------------------------------------------------------------------------------  RADIOLOGY:  Dg Chest 2 View  07/31/2015   CLINICAL DATA:  81 year old male with shortness of breath for the past 2 weeks and generalize weakness.  EXAM:  CHEST  2 VIEW  COMPARISON:  Chest x-ray 04/13/2015.  FINDINGS: No acute consolidative airspace disease. Small right pleural effusion. Mild diffuse interstitial prominence and peribronchial cuffing. No evidence of pulmonary edema. Heart size appears upper limits of normal. Moderate to large hiatal hernia. Upper mediastinal contours are within normal limits. Atherosclerosis in the thoracic aorta. Status post median sternotomy for aortic valve replacement.  IMPRESSION: 1. Mild diffuse interstitial prominence and peribronchial cuffing suggestive of acute bronchitis. 2. Small right pleural effusion. 3. Atherosclerosis. 4. Moderate to large hiatal hernia.   Electronically Signed   By: Trudie Reed M.D.   On: 07/31/2015 09:46    EKG:   sinus rhythm with marked sinus arrhythmia Q waves in the anterior leads no ST elevation or depression  IMPRESSION AND PLAN:    this 81 year old male with aortic valve replacement on Coumadin and history of gastric ulcer who presents again with GI bleed and symptomatic anemia.    1. Symptomatic anemia: This is likely secondary to upper GI bleed as he does report melanoma. Patient has been consented for 2 units of PRBCs which were ordered in the emergency room. I am holding aspirin along with Coumadin and Celebrex. Patient will need GI consultation.  2. GI bleed: This is likely secondary to upper GI bleed. Patient does have a history of gastric ulcer and is on chronic anticoagulation due to aortic valve replacement. Patient is on Protonix 40 IV twice a day. Next hemoglobin is at 1400. GI consultation has been placed.  3. Aortic valve placement: I will consult Dr. Merri Ray in his cardiologist for further evaluation and management.  4. Essential hypertension: Patient will continue outpatient medications including Norvasc and valsartan.  5. Depression/anxiety: Continue Xanax and citalopram.  6. BPH: Continue Flomax and Proscar.  7. Restless leg syndrome: Continue Requip 8.  Hyponatremia: Provide IV fluids and repeat BMP in a.m. I will hold Lasix for now.  All the records are reviewed and case discussed with ED provider. Management plans discussed with the patient and he is in agreement.  CODE STATUS: FULL  TOTAL TIME TAKING CARE OF THIS PATIENT: 50 minutes.    Quanika Solem M.D on 07/31/2015 at 11:24 AM  Between 7am to 6pm - Pager - [phone] After 6pm go to [email or web address] - password [email or web address]  [place] [facility]  Office  [phone]  CC: Primary care physician; [name], MD

## 2015-07-31 NOTE — ED Provider Notes (Signed)
[facility] Emergency Department Provider Note  ____________________________________________  Time seen: Approximately 930 AM  I have reviewed the triage vital signs and the nursing notes.   HISTORY  Chief Complaint Shortness of Breath    HPI [name] is a 81 y.o. male with a history of coronary artery disease on warfarin who is presenting today with shortness of breath over the past 2 weeks. He denies any bleeding in his stool but says that his stool has looked dark to him lately. He denies any chest pain but does have exertional dyspnea. No nausea, vomiting. Does admit to some mild swelling periorbitally as well as to his bilateral lower extremities.   Past Medical History  Diagnosis Date  . GERD (gastroesophageal reflux disease)   . Bradycardia     chronic, no symptoms 07/2010  . Depression   . Anxiety 10/11  . SOB (shortness of breath) 10/11    08/2010,Episodes at 5 AM, eventually felt to be anxiety, after complete workup including catheterization, pt greatly improved with anxiety meds 11/11  . Hypertension     BP higher than usual 04/19/10; amlodipine increased by telephone  . Decreased hearing     Right ear  . Coronary artery disease     mild, cath, 08/2010  . Aortic stenosis     a. s/p mechcanical AVR, 2002  . S/P AVR     a. St. Jude. mechanical 2002; b. echo 08/2010 EF 60%, trival AI, mild MR, AVR working well; c. on longterm warfarin tx  . Carotid bruit     dopplers in past, no abnormalities    Patient Active Problem List   Diagnosis Date Noted  . Bleeding gastric ulcer 05/26/2015  . MRSA bacteremia 05/26/2015  . C. difficile colitis 05/26/2015  . GI bleed 04/16/2015  . Blood loss anemia   . Bleeding gastrointestinal   . S/P AVR (aortic valve replacement)   . Anemia 04/02/2015  . GERD (gastroesophageal reflux disease)   . Bradycardia   . Depression   . Anxiety   . SOB (shortness of breath)   . Hypertension   .  Decreased hearing   . Coronary artery disease   . Aortic stenosis   . S/P AVR   . Warfarin anticoagulation   . Ejection fraction   . Carotid bruit     Past Surgical History  Procedure Laterality Date  . Valve replacement  1/02    Aortic; echo 3/09 valve working well; echo 10/11 working well; put on Coumadin  . Cardiac catheterization    . Joint replacement    . Hernia repair    . Esophagogastroduodenoscopy N/A 04/05/2015    Procedure: ESOPHAGOGASTRODUODENOSCOPY (EGD);  Surgeon: Scot Jun, MD;  Location: [facility] ENDOSCOPY;  Service: Endoscopy;  Laterality: N/A;  . Esophagogastroduodenoscopy N/A 04/17/2015    Procedure: ESOPHAGOGASTRODUODENOSCOPY (EGD);  Surgeon: Scot Jun, MD;  Location: [facility] ENDOSCOPY;  Service: Endoscopy;  Laterality: N/A;    Current Outpatient Rx  Name  Route  Sig  Dispense  Refill  . acetaminophen (TYLENOL) 500 MG tablet   Oral   Take 500 mg by mouth every 4 (four) hours as needed for mild pain.          Marland Kitchen ALPRAZolam (XANAX) 0.5 MG tablet   Oral   Take 0.5 tablets (0.25 mg total) by mouth daily as needed for anxiety.   10 tablet   0   . aluminum-magnesium hydroxide 200-200 MG/5ML suspension   Oral   Take  30 mLs by mouth 2 (two) times daily as needed for indigestion.         Marland Kitchen amLODipine (NORVASC) 10 MG tablet      Take 1 tablet by mouth  daily   90 tablet   2   . celecoxib (CELEBREX) 200 MG capsule   Oral   Take 200 mg by mouth as needed for moderate pain (back and knee pain).          . citalopram (CELEXA) 20 MG tablet   Oral   Take 20 mg by mouth daily.         . finasteride (PROSCAR) 5 MG tablet   Oral   Take 5 mg by mouth daily.           . Multiple Vitamin (MULTIVITAMIN) tablet   Oral   Take 1 tablet by mouth daily.           Marland Kitchen rOPINIRole (REQUIP) 1 MG tablet   Oral   Take 1 tablet (1 mg total) by mouth 3 (three) times daily.   10 tablet   0   . sucralfate (CARAFATE) 1 G tablet   Oral   Take 1 tablet (1  g total) by mouth 4 (four) times daily -  with meals and at bedtime.   10 tablet   0   . Tamsulosin HCl (FLOMAX) 0.4 MG CAPS   Oral   Take 0.4 mg by mouth daily.           . traZODone (DESYREL) 50 MG tablet   Oral   Take 50 mg by mouth at bedtime.         . valsartan (DIOVAN) 320 MG tablet   Oral   Take 320 mg by mouth daily.             Allergies Levofloxacin and Sulfa antibiotics  Family History  Problem Relation Age of Onset  . Hypertension    . Hypertension Mother   . Diabetes type II Mother   . Heart disease Mother   . Hypertension Father   . Heart disease Father     Social History Social History  Substance Use Topics  . Smoking status: Former Games developer  . Smokeless tobacco: None  . Alcohol Use: 1.2 oz/week    2 Glasses of wine, 0 Standard drinks or equivalent per week     Comment: Social    Review of Systems Constitutional: No fever/chills Eyes: No visual changes. ENT: No sore throat. Cardiovascular: Denies chest pain. Respiratory: As above  Gastrointestinal: No abdominal pain.  No nausea, no vomiting.  No diarrhea.  No constipation. Genitourinary: Negative for dysuria. Musculoskeletal: Negative for back pain. Skin: Negative for rash. Neurological: Negative for headaches, focal weakness or numbness.  10-point ROS otherwise negative.  ____________________________________________   PHYSICAL EXAM:  VITAL SIGNS: ED Triage Vitals  Enc Vitals Group     BP 07/31/15 0902 135/58 mmHg     Pulse Rate 07/31/15 0902 69     Resp 07/31/15 0902 20     Temp 07/31/15 0902 97.9 F (36.6 C)     Temp Source 07/31/15 0902 Oral     SpO2 07/31/15 0902 97 %     Weight 07/31/15 0902 200 lb (90.719 kg)     Height 07/31/15 0902  (1.803 m)     Head Cir --      Peak Flow --      Pain Score --      Pain Loc --  Pain Edu? --      Excl. in GC? --     Constitutional: Alert and oriented. Well appearing and in no acute distress. Eyes: Pale sclera.  PERRL. EOMI. Head: Atraumatic. Nose: No congestion/rhinnorhea. Mouth/Throat: Mucous membranes are moist.  Oropharynx non-erythematous. Neck: No stridor.   Cardiovascular: Normal rate, regular rhythm. Grossly normal heart sounds.  Good peripheral circulation. Respiratory: Normal respiratory effort.  No retractions. Lungs CTAB. Gastrointestinal: Soft and nontender. No distention. No abdominal bruits. No CVA tenderness. Rectal exam with brown stool which is strongly heme positive. Musculoskeletal: No lower extremity tenderness nor edema.  No joint effusions. Neurologic:  Normal speech and language. No gross focal neurologic deficits are appreciated. No gait instability. Skin:  Skin is warm, dry and intact. No rash noted. Pale Psychiatric: Mood and affect are normal. Speech and behavior are normal.  ____________________________________________   LABS (all labs ordered are listed, but only abnormal results are displayed)  Labs Reviewed  CBC WITH DIFFERENTIAL/PLATELET - Abnormal; Notable for the following:    RBC 3.22 (*)    Hemoglobin 6.7 (*)    HCT 21.6 (*)    MCV 66.9 (*)    MCH 20.9 (*)    MCHC 31.2 (*)    RDW 18.7 (*)    Lymphs Abs 0.9 (*)    All other components within normal limits  COMPREHENSIVE METABOLIC PANEL - Abnormal; Notable for the following:    Sodium 132 (*)    Chloride 100 (*)    Glucose, Bld 122 (*)    BUN 23 (*)    Calcium 8.8 (*)    All other components within normal limits  BRAIN NATRIURETIC PEPTIDE - Abnormal; Notable for the following:    B Natriuretic Peptide 574.0 (*)    All other components within normal limits  PROTIME-INR - Abnormal; Notable for the following:    Prothrombin Time 22.5 (*)    All other components within normal limits  TROPONIN I  PREPARE RBC (CROSSMATCH)   ____________________________________________  EKG  ED ECG REPORT I, Arelia Longest, the attending physician, personally viewed and interpreted this ECG.   Date:  07/31/2015  EKG Time: 906  Rate: 87  Rhythm: normal sinus rhythm  Axis: Normal axis  Intervals:none  ST&T Change: No ST elevations or depressions. No abnormal T-wave inversions.  ____________________________________________  RADIOLOGY  Acute bronchitis. Small right pleural effusion. Large hiatal hernia. I personally reviewed these images. ____________________________________________   PROCEDURES   ____________________________________________   INITIAL IMPRESSION / ASSESSMENT AND PLAN / ED COURSE  Pertinent labs & imaging results that were available during my care of the patient were reviewed by me and considered in my medical decision making (see chart for details).  ----------------------------------------- 10:30 AM on 07/31/2015 -----------------------------------------  Patient to be admitted to the hospital for symptomatically anemia. We'll transfuse. Signed out to Dr. Tildon Husky. ____________________________________________   FINAL CLINICAL IMPRESSION(S) / ED DIAGNOSES  Acute symptomatically anemia. Likely from GI blood loss. Initial visit.    Myrna Blazer, MD 07/31/15 1030

## 2015-07-31 NOTE — ED Notes (Signed)
Reports sob x 2 wks.  swelling noted around eyes and lower legs.  No resp distress at this time

## 2015-07-31 NOTE — Progress Notes (Signed)
Addendum: pt is in nsr

## 2015-08-01 ENCOUNTER — Telehealth: Payer: Self-pay

## 2015-08-01 ENCOUNTER — Encounter: Payer: Self-pay | Admitting: Gastroenterology

## 2015-08-01 LAB — TYPE AND SCREEN
ABO/RH(D): O POS
ANTIBODY SCREEN: NEGATIVE
Unit division: 0
Unit division: 0

## 2015-08-01 LAB — BASIC METABOLIC PANEL
Anion gap: 4 — ABNORMAL LOW (ref 5–15)
BUN: 18 mg/dL (ref 6–20)
CO2: 26 mmol/L (ref 22–32)
Calcium: 8.4 mg/dL — ABNORMAL LOW (ref 8.9–10.3)
Chloride: 101 mmol/L (ref 101–111)
Creatinine, Ser: 0.74 mg/dL (ref 0.61–1.24)
GLUCOSE: 99 mg/dL (ref 65–99)
Potassium: 4.2 mmol/L (ref 3.5–5.1)
SODIUM: 131 mmol/L — AB (ref 135–145)

## 2015-08-01 LAB — CBC
HEMATOCRIT: 22.9 % — AB (ref 40.0–52.0)
HEMOGLOBIN: 7.3 g/dL — AB (ref 13.0–18.0)
MCH: 22 pg — ABNORMAL LOW (ref 26.0–34.0)
MCHC: 31.8 g/dL — AB (ref 32.0–36.0)
MCV: 69.3 fL — AB (ref 80.0–100.0)
Platelets: 276 10*3/uL (ref 150–440)
RBC: 3.3 MIL/uL — ABNORMAL LOW (ref 4.40–5.90)
RDW: 20.1 % — ABNORMAL HIGH (ref 11.5–14.5)
WBC: 5.4 10*3/uL (ref 3.8–10.6)

## 2015-08-01 MED ORDER — ROPINIROLE HCL 1 MG PO TABS
2.0000 mg | ORAL_TABLET | Freq: Every day | ORAL | Status: DC | PRN
  Administered 2015-08-01 – 2015-08-07 (×27): 2 mg via ORAL
  Filled 2015-08-01 (×27): qty 2

## 2015-08-01 MED ORDER — CEFAZOLIN (ANCEF) 1 G IV SOLR: 1.0000 g | INTRAVENOUS | Status: DC

## 2015-08-01 MED ORDER — CEFAZOLIN SODIUM 1-5 GM-% IV SOLN
1.0000 g | INTRAVENOUS | Status: AC
  Administered 2015-08-02: 1 g via INTRAVENOUS
  Filled 2015-08-01: qty 50

## 2015-08-01 MED ORDER — DIAZEPAM 2 MG PO TABS
2.0000 mg | ORAL_TABLET | Freq: Four times a day (QID) | ORAL | Status: DC | PRN
  Administered 2015-08-02 – 2015-08-06 (×7): 2 mg via ORAL
  Filled 2015-08-01 (×8): qty 1

## 2015-08-01 NOTE — Consult Note (Signed)
GI Inpatient Consult Note  Reason for Consult:   Attending Requesting Consult:  History of Present Illness: [name] is a 81 y.o. male with increasing weaknes, SOB, and melena. Hgb low. Had GU that required epinephrine and cauterization on 5/23. Unfortunately, only given 1 month supply of PPI. Takes coumadin chronically. On PPI now. Feels better.  Past Medical History:  Past Medical History  Diagnosis Date  . GERD (gastroesophageal reflux disease)   . Bradycardia     chronic, no symptoms 07/2010  . Depression   . Anxiety 10/11  . SOB (shortness of breath) 10/11    08/2010,Episodes at 5 AM, eventually felt to be anxiety, after complete workup including catheterization, pt greatly improved with anxiety meds 11/11  . Hypertension     BP higher than usual 04/19/10; amlodipine increased by telephone  . Decreased hearing     Right ear  . Coronary artery disease     mild, cath, 08/2010  . Aortic stenosis     a. s/p mechcanical AVR, 2002  . S/P AVR     a. St. Jude. mechanical 2002; b. echo 08/2010 EF 60%, trival AI, mild MR, AVR working well; c. on longterm warfarin tx  . Carotid bruit     dopplers in past, no abnormalities  . Gastric ulcer   . C. difficile colitis     Problem List: Patient Active Problem List   Diagnosis Date Noted  . GIB (gastrointestinal bleeding) 07/31/2015  . Bleeding gastric ulcer 05/26/2015  . MRSA bacteremia 05/26/2015  . C. difficile colitis 05/26/2015  . GI bleed 04/16/2015  . Blood loss anemia   . Bleeding gastrointestinal   . S/P AVR (aortic valve replacement)   . Anemia 04/02/2015  . GERD (gastroesophageal reflux disease)   . Bradycardia   . Depression   . Anxiety   . SOB (shortness of breath)   . Hypertension   . Decreased hearing   . Coronary artery disease   . Aortic stenosis   . S/P AVR   . Warfarin anticoagulation   . Ejection fraction   . Carotid bruit     Past Surgical History: Past Surgical History  Procedure  Laterality Date  . Valve replacement  1/02    Aortic; echo 3/09 valve working well; echo 10/11 working well; put on Coumadin  . Cardiac catheterization    . Joint replacement    . Hernia repair    . Esophagogastroduodenoscopy N/A 04/05/2015    Procedure: ESOPHAGOGASTRODUODENOSCOPY (EGD);  Surgeon: Scot Jun, MD;  Location: [facility] ENDOSCOPY;  Service: Endoscopy;  Laterality: N/A;  . Esophagogastroduodenoscopy N/A 04/17/2015    Procedure: ESOPHAGOGASTRODUODENOSCOPY (EGD);  Surgeon: Scot Jun, MD;  Location: [facility] ENDOSCOPY;  Service: Endoscopy;  Laterality: N/A;    Allergies: Allergies  Allergen Reactions  . Levofloxacin Nausea Only  . Sulfa Antibiotics Other (See Comments)    Reaction:  Unknown     Home Medications: Prescriptions prior to admission  Medication Sig Dispense Refill Last Dose  . acetaminophen (TYLENOL) 500 MG tablet Take 500 mg by mouth every 4 (four) hours as needed for mild pain or headache.    Past Week at Unknown time  . ALPRAZolam (XANAX) 0.5 MG tablet Take 0.5 tablets (0.25 mg total) by mouth daily as needed for anxiety. (Patient taking differently: Take 0.5 mg by mouth daily as needed for anxiety. ) 10 tablet 0 PRN at PRN  . amLODipine (NORVASC) 10 MG tablet Take 1 tablet by mouth  daily (Patient  taking differently: Take 10 mg by mouth daily. ) 90 tablet 2 07/31/2015 at Unknown time  . aspirin EC 81 MG tablet Take 81 mg by mouth daily.   07/31/2015 at 0800  . celecoxib (CELEBREX) 200 MG capsule Take 200 mg by mouth daily as needed for moderate pain.   Past Month at Unknown time  . citalopram (CELEXA) 20 MG tablet Take 20 mg by mouth daily.   07/31/2015 at Unknown time  . finasteride (PROSCAR) 5 MG tablet Take 5 mg by mouth daily.     07/31/2015 at Unknown time  . furosemide (LASIX) 20 MG tablet Take 20 mg by mouth daily as needed for edema.   07/30/2015 at Unknown time  . Multiple Vitamin (MULTIVITAMIN WITH MINERALS) TABS tablet Take 1 tablet by mouth daily.   Past  Week at Unknown time  . ranitidine (ZANTAC) 150 MG tablet Take 150 mg by mouth daily as needed for heartburn.   Past Month at Unknown time  . rOPINIRole (REQUIP) 1 MG tablet Take 1 tablet (1 mg total) by mouth 3 (three) times daily. (Patient taking differently: Take 1 mg by mouth 5 (five) times daily. ) 10 tablet 0 07/31/2015 at Unknown time  . Tamsulosin HCl (FLOMAX) 0.4 MG CAPS Take 0.4 mg by mouth at bedtime.    07/30/2015 at Unknown time  . traZODone (DESYREL) 50 MG tablet Take 50 mg by mouth at bedtime.   07/30/2015 at Unknown time  . valsartan (DIOVAN) 320 MG tablet Take 320 mg by mouth daily.     07/31/2015 at Unknown time  . warfarin (COUMADIN) 5 MG tablet Take 5-7.5 mg by mouth at bedtime. Pt takes 7.5mg  on Monday, Wednesday, and Friday.   Pt takes 5mg  all other days.   07/30/2015 at 2000   Home medication reconciliation was completed with the patient.   Scheduled Inpatient Medications:   . amLODipine  10 mg Oral Daily  . citalopram  20 mg Oral Daily  . finasteride  5 mg Oral Daily  . irbesartan  37.5 mg Oral Daily  . multivitamin with minerals  1 tablet Oral Daily  . pantoprazole (PROTONIX) IV  40 mg Intravenous Q12H  . tamsulosin  0.4 mg Oral QHS  . traZODone  50 mg Oral QHS    Continuous Inpatient Infusions:   . sodium chloride 75 mL/hr at 07/31/15 2329    PRN Inpatient Medications:  acetaminophen **OR** acetaminophen, acetaminophen, ALPRAZolam, alum & mag hydroxide-simeth, bisacodyl, diazepam, HYDROcodone-acetaminophen, ondansetron **OR** ondansetron (ZOFRAN) IV, rOPINIRole, senna-docusate  Family History: family history includes Diabetes type II in his mother; Heart disease in his father and mother; Hypertension in his father, mother, and another family member.  The patient's family history is negative for inflammatory bowel disorders, GI malignancy, or solid organ transplantation.  Social History:   reports that he has quit smoking. He does not have any smokeless tobacco  history on file. He reports that he drinks about 1.2 oz of alcohol per week. He reports that he does not use illicit drugs. The patient denies ETOH, tobacco, or drug use.   Review of Systems: Constitutional: Weight is stable.  Eyes: No changes in vision. ENT: No oral lesions, sore throat.  GI: see HPI.  Heme/Lymph: No easy bruising.  CV: No chest pain.  GU: No hematuria.  Integumentary: No rashes.  Neuro: No headaches.  Psych: No depression/anxiety.  Endocrine: No heat/cold intolerance.  Allergic/Immunologic: No urticaria.  Resp: No cough, SOB.  Musculoskeletal: No joint swelling.  Physical Examination: BP 147/66 mmHg  Pulse 61  Temp(Src) 97.5 F (36.4 C) (Oral)  Resp 18  Ht  (1.803 m)  Wt 90.719 kg (200 lb)  BMI 27.91 kg/m2  SpO2 96% Gen: NAD, alert and oriented x 4 HEENT: PEERLA, EOMI, Neck: supple, no JVD or thyromegaly Chest: CTA bilaterally, no wheezes, crackles, or other adventitious sounds CV: RRR, no m/g/c/r Abd: soft, NT, ND, +BS in all four quadrants; no HSM, guarding, ridigity, or rebound tenderness Ext: no edema, well perfused with 2+ pulses, Skin: no rash or lesions noted Lymph: no LAD  Data: Lab Results  Component Value Date   WBC 5.4 08/01/2015   HGB 7.3* 08/01/2015   HCT 22.9* 08/01/2015   MCV 69.3* 08/01/2015   PLT 276 08/01/2015    Recent Labs Lab 07/31/15 0917 07/31/15 1433 08/01/15 0259  HGB 6.7* 6.3* 7.3*   Lab Results  Component Value Date   NA 131* 08/01/2015   K 4.2 08/01/2015   CL 101 08/01/2015   CO2 26 08/01/2015   BUN 18 08/01/2015   CREATININE 0.74 08/01/2015   Lab Results  Component Value Date   ALT 17 07/31/2015   AST 27 07/31/2015   ALKPHOS 82 07/31/2015   BILITOT 1.0 07/31/2015    Recent Labs Lab 07/31/15 0917  INR 1.96   Assessment/Plan: Mr. Shew is a 81 y.o. male wit recurrent UGI bleeding. Hx of gastric ulcer.  Recommendations: Repeat EGD tomorrow. Hopefully, INR be close to 1.5 by then.  Prophylactic Abx ordered.  Thank you for the consult. Please call with questions or concerns.  Philip Eckersley, Ezzard Standing, MD

## 2015-08-01 NOTE — Progress Notes (Signed)
Resting resp even no complaints, daughter at bedside

## 2015-08-01 NOTE — Progress Notes (Signed)
A&O. Resting in bed through the night. Received 2Units of PRBCs yesterday. Tolerated well. IV fluids infusing. Complained of legs cramping and restless leg. Meds given for relief. On room air. Lanika [employer]

## 2015-08-01 NOTE — Progress Notes (Signed)
Pt out of bed in chair eating supper. No distress noted. Daughter at bedside

## 2015-08-01 NOTE — Progress Notes (Addendum)
[facility] - [place] [facility]   PATIENT NAME: [name]    MR#:  [id number]  DATE OF BIRTH:  February 06, 1931  SUBJECTIVE:  Patient had a bad night overnight due to restless leg syndrome. He denies coffee-ground emesis or melana.  REVIEW OF SYSTEMS:    Review of Systems  Constitutional: Negative for fever, chills and malaise/fatigue.  HENT: Negative for sore throat.   Eyes: Negative for blurred vision.  Respiratory: Negative for cough, hemoptysis, sputum production, shortness of breath and wheezing.   Cardiovascular: Negative for chest pain, palpitations and leg swelling.  Gastrointestinal: Negative for nausea, vomiting, abdominal pain, diarrhea and blood in stool.  Genitourinary: Negative for dysuria.  Musculoskeletal: Negative for back pain.       RLS  Neurological: Negative for dizziness, tremors, focal weakness and headaches.  Endo/Heme/Allergies: Does not bruise/bleed easily.    Tolerating Diet:yes      DRUG ALLERGIES:   Allergies  Allergen Reactions  . Levofloxacin Nausea Only  . Sulfa Antibiotics Other (See Comments)    Reaction:  Unknown     VITALS:  Blood pressure 148/68, pulse 75, temperature 97.5 F (36.4 C), temperature source Oral, resp. rate 16, height  (1.803 m), weight 90.719 kg (200 lb), SpO2 93 %.  PHYSICAL EXAMINATION:   Physical Exam  Constitutional: He is oriented to person, place, and time and well-developed, well-nourished, and in no distress. No distress.  HENT:  Head: Normocephalic.  Eyes: No scleral icterus.  Neck: Normal range of motion. Neck supple. No JVD present. No tracheal deviation present.  Cardiovascular: Normal rate, regular rhythm and normal heart sounds.  Exam reveals no gallop and no friction rub.   No murmur heard. AV click  Pulmonary/Chest: Effort normal and breath sounds normal. No respiratory distress. He has no wheezes. He has no rales. He exhibits no tenderness.  Abdominal: Soft.  Bowel sounds are normal. He exhibits no distension and no mass. There is no tenderness. There is no rebound and no guarding.  Musculoskeletal: Normal range of motion. He exhibits no edema.  Neurological: He is alert and oriented to person, place, and time.  Skin: Skin is warm. No rash noted. No erythema.  Psychiatric: Affect and judgment normal.      LABORATORY PANEL:   CBC  Recent Labs Lab 08/01/15 0259  WBC 5.4  HGB 7.3*  HCT 22.9*  PLT 276   ------------------------------------------------------------------------------------------------------------------  Chemistries   Recent Labs Lab 07/31/15 0917 08/01/15 0259  NA 132* 131*  K 4.3 4.2  CL 100* 101  CO2 25 26  GLUCOSE 122* 99  BUN 23* 18  CREATININE 0.92 0.74  CALCIUM 8.8* 8.4*  AST 27  --   ALT 17  --   ALKPHOS 82  --   BILITOT 1.0  --    ------------------------------------------------------------------------------------------------------------------  Cardiac Enzymes  Recent Labs Lab 07/31/15 0917  TROPONINI <0.03   ------------------------------------------------------------------------------------------------------------------  RADIOLOGY:  Dg Chest 2 View  07/31/2015   CLINICAL DATA:  81 year old male with shortness of breath for the past 2 weeks and generalize weakness.  EXAM: CHEST  2 VIEW  COMPARISON:  Chest x-ray 04/13/2015.  FINDINGS: No acute consolidative airspace disease. Small right pleural effusion. Mild diffuse interstitial prominence and peribronchial cuffing. No evidence of pulmonary edema. Heart size appears upper limits of normal. Moderate to large hiatal hernia. Upper mediastinal contours are within normal limits. Atherosclerosis in the thoracic aorta. Status post median sternotomy for aortic valve replacement.  IMPRESSION: 1. Mild diffuse  interstitial prominence and peribronchial cuffing suggestive of acute bronchitis. 2. Small right pleural effusion. 3. Atherosclerosis. 4. Moderate to  large hiatal hernia.   Electronically Signed   By: Trudie Reed M.D.   On: 07/31/2015 09:46     ASSESSMENT AND PLAN:   this 81 year old male with aortic valve replacement on Coumadin and history of gastric ulcer who presents again with GI bleed and symptomatic anemia.    1. Symptomatic anemia: This is likely secondary to upper GI bleed as he does report melana. Patient has received 2 units appear bases. His hemoglobin is 7.3. Continue to monitor hemoglobin.Continue to hold aspirin, Coumadin and Celebrex. Plan for EGD in a.m.  2. GI bleed: This is likely secondary to upper GI bleed. Patient does have a history of gastric ulcer and is on chronic anticoagulation due to aortic valve replacement. Patient is on Protonix 40 IV twice a day. Plan for EGD in a.m.  3. Aortic valve placement: Holding Coumadin due to problem #2. Cardiology has been consulted.  4. Essential hypertension: Patient will continue outpatient medications including Norvasc and valsartan.  5. Depression/anxiety: Continue Xanax and citalopram.  6. BPH: Continue Flomax and Proscar.  7. Restless leg syndrome: Continue and increase dose of Requip and add Valium when necessary 8. Hyponatremia: Sodium level 131 today continue to monitor as it is slightly lower than yesterday. Continue IV fluids     Management plans discussed with the patient and daughter at bedside and he is in agreement.  CODE STATUS: Full  TOTAL TIME TAKING CARE OF THIS PATIENT: 30 minutes.     POSSIBLE D/C 3 days, DEPENDING ON CLINICAL CONDITION.   Krystall Kruckenberg M.D on 08/01/2015 at 1:33 PM  Between 7am to 6pm - Pager - [phone] After 6pm go to [email or web address] - password [email or web address] [facility]  [facility]  Office  [phone]  CC: Primary care physician; [name], MD

## 2015-08-01 NOTE — Telephone Encounter (Signed)
Pt called, states Dr. Mariah Milling saw him yesterday in [facility], states Dr. Mariah Milling told him he could have as many Requip(for resteless legs) as he needed, states the hospital is only allowing him to have 4 a day. Please call.

## 2015-08-02 ENCOUNTER — Inpatient Hospital Stay: Payer: Medicare Other | Admitting: Certified Registered Nurse Anesthetist

## 2015-08-02 ENCOUNTER — Encounter: Payer: Self-pay | Admitting: *Deleted

## 2015-08-02 ENCOUNTER — Encounter
Admission: EM | Disposition: A | Discharge status: Home / Self Care | Payer: Self-pay | Source: Home / Self Care | Attending: Internal Medicine

## 2015-08-02 HISTORY — PX: ESOPHAGOGASTRODUODENOSCOPY: SHX5428

## 2015-08-02 LAB — BASIC METABOLIC PANEL
Anion gap: 7 (ref 5–15)
BUN: 12 mg/dL (ref 6–20)
CALCIUM: 8.5 mg/dL — AB (ref 8.9–10.3)
CO2: 23 mmol/L (ref 22–32)
CREATININE: 0.68 mg/dL (ref 0.61–1.24)
Chloride: 99 mmol/L — ABNORMAL LOW (ref 101–111)
GLUCOSE: 88 mg/dL (ref 65–99)
Potassium: 3.9 mmol/L (ref 3.5–5.1)
Sodium: 129 mmol/L — ABNORMAL LOW (ref 135–145)

## 2015-08-02 LAB — CBC
HEMATOCRIT: 24.2 % — AB (ref 40.0–52.0)
Hemoglobin: 7.9 g/dL — ABNORMAL LOW (ref 13.0–18.0)
MCH: 22.6 pg — ABNORMAL LOW (ref 26.0–34.0)
MCHC: 32.6 g/dL (ref 32.0–36.0)
MCV: 69.3 fL — ABNORMAL LOW (ref 80.0–100.0)
PLATELETS: 305 10*3/uL (ref 150–440)
RBC: 3.48 MIL/uL — ABNORMAL LOW (ref 4.40–5.90)
RDW: 20 % — AB (ref 11.5–14.5)
WBC: 5.9 10*3/uL (ref 3.8–10.6)

## 2015-08-02 LAB — PROTIME-INR
INR: 1.87
Prothrombin Time: 21.7 seconds — ABNORMAL HIGH (ref 11.4–15.0)

## 2015-08-02 SURGERY — EGD (ESOPHAGOGASTRODUODENOSCOPY)
Anesthesia: General

## 2015-08-02 MED ORDER — LIDOCAINE HCL (CARDIAC) 20 MG/ML IV SOLN
INTRAVENOUS | Status: DC | PRN
  Administered 2015-08-02: 100 mg via INTRAVENOUS

## 2015-08-02 MED ORDER — SODIUM CHLORIDE 0.9 % IV SOLN
INTRAVENOUS | Status: DC
  Administered 2015-08-02: 1000 mL via INTRAVENOUS

## 2015-08-02 MED ORDER — PROPOFOL 10 MG/ML IV BOLUS
INTRAVENOUS | Status: DC | PRN
  Administered 2015-08-02 (×2): 10 mg via INTRAVENOUS
  Administered 2015-08-02: 30 mg via INTRAVENOUS

## 2015-08-02 NOTE — Transfer of Care (Signed)
Immediate Anesthesia Transfer of Care Note  Patient: [name]  Procedure(s) Performed: Procedure(s): ESOPHAGOGASTRODUODENOSCOPY (EGD) (N/A)  Patient Location: PACU  Anesthesia Type:General  Level of Consciousness: awake, alert  and oriented  Airway & Oxygen Therapy: Patient Spontanous Breathing and Patient connected to nasal cannula oxygen  Post-op Assessment: Report given to RN and Post -op Vital signs reviewed and stable  Post vital signs: Reviewed and stable  Last Vitals:  Filed Vitals:   08/02/15 1357  BP: 112/62  Pulse: 67  Temp: 35.9 C  Resp: 14    Complications: No apparent anesthesia complications

## 2015-08-02 NOTE — Care Management Important Message (Signed)
Important Message  Patient Details  Name: [name] MRN: [id number] Date of Birth: 15-Oct-1931   Medicare Important Message Given:  Yes-second notification given    Verita Schneiders Allmond 08/02/2015, 2:14 PM

## 2015-08-02 NOTE — Op Note (Signed)
EGD only showed gastritis with no active bleeding. No recurrence of gastric ulcer. Bx of H.pylori taken. Regular diet. As long as pt requires coumadin, may be prudent to keep pt on daily PPI. thanks

## 2015-08-02 NOTE — Care Management (Signed)
Hgb 7.9 today.  INR 1.87.  Anticipate EGD today if not contraindicated due to the INR.

## 2015-08-02 NOTE — Anesthesia Preprocedure Evaluation (Signed)
Anesthesia Evaluation  Patient identified by MRN, date of birth, ID band Patient awake    Reviewed: Allergy & Precautions, H&P , NPO status , Patient's Chart, lab work & pertinent test results, reviewed documented beta blocker date and time   History of Anesthesia Complications Negative for: history of anesthetic complications  Airway Mallampati: III  TM Distance: >3 FB Neck ROM: full    Dental  (+) Poor Dentition, Missing   Pulmonary shortness of breath, neg COPD, neg recent URI, former smoker,    Pulmonary exam normal breath sounds clear to auscultation       Cardiovascular Exercise Tolerance: Good hypertension, On Medications (-) angina+ CAD  (-) Past MI, (-) Cardiac Stents and (-) CABG Normal cardiovascular exam(-) dysrhythmias + Valvular Problems/Murmurs (s/p AVR) AS  Rhythm:regular Rate:Normal     Neuro/Psych PSYCHIATRIC DISORDERS (depression) negative neurological ROS     GI/Hepatic Neg liver ROS, PUD, GERD  ,  Endo/Other  negative endocrine ROS  Renal/GU negative Renal ROS  negative genitourinary   Musculoskeletal   Abdominal   Peds  Hematology  (+) Blood dyscrasia, anemia ,   Anesthesia Other Findings Past Medical History:   GERD (gastroesophageal reflux disease)                       Bradycardia                                                    Comment:chronic, no symptoms 07/2010   Depression                                                   Anxiety                                         10/11        SOB (shortness of breath)                       10/11          Comment:08/2010,Episodes at 5 AM, eventually felt to be              anxiety, after complete workup including               catheterization, pt greatly improved with               anxiety meds 11/11   Hypertension                                                   Comment:BP higher than usual 04/19/10; amlodipine               increased by  telephone   Decreased hearing  Comment:Right ear   Coronary artery disease                                        Comment:mild, cath, 08/2010   Aortic stenosis                                                Comment:a. s/p mechcanical AVR, 2002   S/P AVR                                                        Comment:a. St. Jude. mechanical 2002; b. echo 08/2010               EF 60%, trival AI, mild MR, AVR working well;               c. on longterm warfarin tx   Carotid bruit                                                  Comment:dopplers in past, no abnormalities   Gastric ulcer                                                C. difficile colitis                                         Reproductive/Obstetrics negative OB ROS                             Anesthesia Physical Anesthesia Plan  ASA: III  Anesthesia Plan: General   Post-op Pain Management:    Induction:   Airway Management Planned:   Additional Equipment:   Intra-op Plan:   Post-operative Plan:   Informed Consent: I have reviewed the patients History and Physical, chart, labs and discussed the procedure including the risks, benefits and alternatives for the proposed anesthesia with the patient or authorized representative who has indicated his/her understanding and acceptance.   Dental Advisory Given  Plan Discussed with: Anesthesiologist, CRNA and Surgeon  Anesthesia Plan Comments:         Anesthesia Quick Evaluation

## 2015-08-02 NOTE — Op Note (Signed)
[facility]astroenterology Patient Name: [name] Procedure Date: 08/02/2015 1:40 PM MRN: [id number] Account #: [id number] Date of Birth: [birth year] Admit Type: Inpatient Age: 81 Room: [facility] ENDO ROOM 4 Gender: Male Note Status: Finalized Procedure:         Upper GI endoscopy Indications:       Epigastric abdominal pain, Melena, Hx of GU. Providers:         Ezzard Standing. Bluford Kaufmann, MD Referring MD:      Letta Pate. Danne Harbor, MD (Referring MD) Medicines:         Monitored Anesthesia Care Complications:     No immediate complications. Procedure:         Pre-Anesthesia Assessment:                    - Prior to the procedure, a History and Physical was                     performed, and patient medications, allergies and                     sensitivities were reviewed. The patient's tolerance of                     previous anesthesia was reviewed.                    - The risks and benefits of the procedure and the sedation                     options and risks were discussed with the patient. All                     questions were answered and informed consent was obtained.                    - After reviewing the risks and benefits, the patient was                     deemed in satisfactory condition to undergo the procedure.                    After obtaining informed consent, the endoscope was passed                     under direct vision. Throughout the procedure, the                     patient's blood pressure, pulse, and oxygen saturations                     were monitored continuously. The Endoscope was introduced                     through the mouth, and advanced to the second part of                     duodenum. The upper GI endoscopy was accomplished without                     difficulty. The patient tolerated the procedure well. Findings:      The examined esophagus was normal.      A medium-sized hiatus hernia was present.      Localized moderate  inflammation was found in the  gastric antrum.       Biopsies were taken with a cold forceps for Helicobacter pylori testing.      The exam was otherwise without abnormality.      The examined duodenum was normal. Impression:        - Normal esophagus.                    - Medium-sized hiatus hernia.                    - Gastritis. Biopsied.                    - The examination was otherwise normal.                    - Normal examined duodenum. Recommendation:    - Observe patient's clinical course.                    - Continue present medications.                    - The findings and recommendations were discussed with the                     patient. Procedure Code(s): --- Professional ---                    [id number], Esophagogastroduodenoscopy, flexible, transoral;                     with biopsy, single or multiple Diagnosis Code(s): --- Professional ---                    K44.9, Diaphragmatic hernia without obstruction or gangrene                    K29.70, Gastritis, unspecified, without bleeding                    R10.13, Epigastric pain                    K92.1, Melena CPT copyright 2014 American Medical Association. All rights reserved. The codes documented in this report are preliminary and upon coder review may  be revised to meet current compliance requirements. Wallace Cullens, MD 08/02/2015 1:52:33 PM This report has been signed electronically. Number of Addenda: 0 Note Initiated On: 08/02/2015 1:40 PM      [facility]

## 2015-08-02 NOTE — Telephone Encounter (Signed)
This was fixed by hospitalist

## 2015-08-02 NOTE — Care Management (Signed)
Informed that will be proceeding with EGD today.  At present anticipate patient to return to his independent living at [facility]

## 2015-08-02 NOTE — Progress Notes (Signed)
[facility] Physicians - [place] at [facility]   PATIENT NAME: [name]    MR#:  [id number]  DATE OF BIRTH:  13-Oct-1931  SUBJECTIVE:   patient doing well this morning. Patient is planned for EGD this afternoon  REVIEW OF SYSTEMS:    Review of Systems  Constitutional: Negative for fever, chills and malaise/fatigue.  HENT: Negative for sore throat.   Eyes: Negative for blurred vision.  Respiratory: Negative for cough, hemoptysis, shortness of breath and wheezing.   Cardiovascular: Negative for chest pain, palpitations and leg swelling.  Gastrointestinal: Negative for nausea, vomiting, abdominal pain, diarrhea and blood in stool.  Genitourinary: Negative for dysuria.  Musculoskeletal: Negative for back pain.  Neurological: Negative for dizziness, tremors and headaches.  Endo/Heme/Allergies: Does not bruise/bleed easily.    Tolerating Diet: currently nothing by mouth      DRUG ALLERGIES:   Allergies  Allergen Reactions  . Levofloxacin Nausea Only  . Sulfa Antibiotics Other (See Comments)    Reaction:  Unknown     VITALS:  Blood pressure 174/65, pulse 67, temperature 97.6 F (36.4 C), temperature source Oral, resp. rate 19, height 5\' 11"  (1.803 m), weight 90.719 kg (200 lb), SpO2 95 %.  PHYSICAL EXAMINATION:   Physical Exam  Constitutional: He is oriented to person, place, and time and well-developed, well-nourished, and in no distress. No distress.  HENT:  Head: Normocephalic.  Eyes: No scleral icterus.  Neck: Normal range of motion. Neck supple. No JVD present. No tracheal deviation present.  Cardiovascular: Normal rate, regular rhythm and normal heart sounds.  Exam reveals no gallop and no friction rub.   No murmur heard.  Aortic valve click  Pulmonary/Chest: Effort normal and breath sounds normal. No respiratory distress. He has no wheezes. He has no rales. He exhibits no tenderness.  Abdominal: Soft. Bowel sounds are normal. He exhibits no  distension and no mass. There is no tenderness. There is no rebound and no guarding.  Musculoskeletal: Normal range of motion. He exhibits no edema.  Neurological: He is alert and oriented to person, place, and time.  Skin: Skin is warm. No rash noted. No erythema.  Psychiatric: Affect and judgment normal.      LABORATORY PANEL:   CBC  Recent Labs Lab 08/02/15 0432  WBC 5.9  HGB 7.9*  HCT 24.2*  PLT 305   ------------------------------------------------------------------------------------------------------------------  Chemistries   Recent Labs Lab 07/31/15 0917  08/02/15 0432  NA 132*  < > 129*  K 4.3  < > 3.9  CL 100*  < > 99*  CO2 25  < > 23  GLUCOSE 122*  < > 88  BUN 23*  < > 12  CREATININE 0.92  < > 0.68  CALCIUM 8.8*  < > 8.5*  AST 27  --   --   ALT 17  --   --   ALKPHOS 82  --   --   BILITOT 1.0  --   --   < > = values in this interval not displayed. ------------------------------------------------------------------------------------------------------------------  Cardiac Enzymes  Recent Labs Lab 07/31/15 0917  TROPONINI <0.03   ------------------------------------------------------------------------------------------------------------------  RADIOLOGY:  No results found.   ASSESSMENT AND PLAN:   This 81 year old male with aortic valve replacement on Coumadin and history of gastric ulcer who presents again with GI bleed and symptomatic anemia.    1. Symptomatic anemia: This is likely secondary to upper GI bleed as he does report melana. Patient has received 2 units appear bases. His hemoglobin  is 7.8. Continue to hold aspirin, Coumadin and Celebrex. Plan for EGD this afternoon.  2. GI bleed: This is likely secondary to upper GI bleed. Patient does have a history of gastric ulcer and is on chronic anticoagulation due to aortic valve replacement. Patient is on Protonix 40 IV twice a day. Plan for EGD. 3. Aortic valve placement: Holding Coumadin  due to problem #2. Cardiology has been consulted.  4. Essential hypertension: Patient will continue outpatient medications including Norvasc and valsartan.  5. Depression/anxiety: Continue Xanax and citalopram.  6. BPH: Continue Flomax and Proscar.  7. Restless leg syndrome: Continue increased dose of Requip and add Valium when necessary 8. Hyponatremia: Sodium level 129 today. I wil stop IVF after EGD and recheck Na level.    Management plans discussed with the patient and he is in agreement.  CODE STATUS: FULL  TOTAL TIME TAKING CARE OF THIS PATIENT: 25 minutes.     POSSIBLE D/C 2 days, DEPENDING ON CLINICAL CONDITION.   Jera Headings M.D on 08/02/2015 at 11:10 AM  Between 7am to 6pm - Pager - [phone] After 6pm go to [email or web address] - password [email or web address] [facility]  [facility]  Office  [phone]  CC: Primary care physician; [name], MD

## 2015-08-03 LAB — CBC
HEMATOCRIT: 23.1 % — AB (ref 40.0–52.0)
HEMOGLOBIN: 7.5 g/dL — AB (ref 13.0–18.0)
MCH: 22.4 pg — AB (ref 26.0–34.0)
MCHC: 32.4 g/dL (ref 32.0–36.0)
MCV: 69.2 fL — AB (ref 80.0–100.0)
Platelets: 287 10*3/uL (ref 150–440)
RBC: 3.33 MIL/uL — AB (ref 4.40–5.90)
RDW: 20.3 % — ABNORMAL HIGH (ref 11.5–14.5)
WBC: 5 10*3/uL (ref 3.8–10.6)

## 2015-08-03 LAB — BASIC METABOLIC PANEL
ANION GAP: 5 (ref 5–15)
BUN: 10 mg/dL (ref 6–20)
CHLORIDE: 101 mmol/L (ref 101–111)
CO2: 26 mmol/L (ref 22–32)
Calcium: 8.2 mg/dL — ABNORMAL LOW (ref 8.9–10.3)
Creatinine, Ser: 0.78 mg/dL (ref 0.61–1.24)
GFR calc Af Amer: >60 mL/min (ref >60)
GFR calc non Af Amer: >60 mL/min (ref >60)
GLUCOSE: 94 mg/dL (ref 65–99)
POTASSIUM: 3.7 mmol/L (ref 3.5–5.1)
Sodium: 132 mmol/L — ABNORMAL LOW (ref 135–145)

## 2015-08-03 MED ORDER — WARFARIN - PHARMACIST DOSING INPATIENT
Freq: Every day | Status: DC
  Administered 2015-08-03 – 2015-08-06 (×3)

## 2015-08-03 MED ORDER — WARFARIN - PHYSICIAN DOSING INPATIENT: Freq: Every day | Status: DC

## 2015-08-03 MED ORDER — WARFARIN SODIUM 5 MG PO TABS: 5.0000 mg | ORAL_TABLET | Freq: Every day | ORAL | Status: DC

## 2015-08-03 MED ORDER — WARFARIN SODIUM 5 MG PO TABS
5.0000 mg | ORAL_TABLET | ORAL | Status: DC
Start: 2015-08-04 — End: 2015-08-04

## 2015-08-03 MED ORDER — PANTOPRAZOLE SODIUM 40 MG PO TBEC
40.0000 mg | DELAYED_RELEASE_TABLET | Freq: Every day | ORAL | Status: DC
  Administered 2015-08-03 – 2015-08-07 (×5): 40 mg via ORAL
  Filled 2015-08-03 (×5): qty 1

## 2015-08-03 MED ORDER — WARFARIN SODIUM 5 MG PO TABS
7.5000 mg | ORAL_TABLET | ORAL | Status: DC
  Administered 2015-08-03: 7.5 mg via ORAL
  Filled 2015-08-03: qty 2

## 2015-08-03 NOTE — Care Management (Signed)
Barrier to discharge.  Hgb 7.3 today and may need transfusion.  Has aortic valve and coumadin has been on hold.  This has been restarted and INR will need to be between 2-3 before can discharge.  Discussed need to prevent decline in functional status during progression

## 2015-08-03 NOTE — Progress Notes (Signed)
[facility] Physicians - [place] at [facility]   PATIENT NAME: [name]    MR#:  [id number]  DATE OF BIRTH:  23-Jun-1931  SUBJECTIVE:  EGD showed gastritis no active bleeding  REVIEW OF SYSTEMS:    Review of Systems  Constitutional: Negative for fever, chills and malaise/fatigue.  HENT: Negative for sore throat.   Eyes: Negative for blurred vision.  Respiratory: Negative for cough, hemoptysis, shortness of breath and wheezing.   Cardiovascular: Negative for chest pain, palpitations and leg swelling.  Gastrointestinal: Negative for nausea, vomiting, abdominal pain, diarrhea and blood in stool.  Genitourinary: Negative for dysuria.  Musculoskeletal: Negative for back pain.  Neurological: Negative for dizziness, tremors and headaches.  Endo/Heme/Allergies: Does not bruise/bleed easily.    Tolerating Diet: yes    DRUG ALLERGIES:   Allergies  Allergen Reactions  . Levofloxacin Nausea Only  . Sulfa Antibiotics Other (See Comments)    Reaction:  Unknown     VITALS:  Blood pressure 157/59, pulse 76, temperature 97.3 F (36.3 C), temperature source Oral, resp. rate 18, height 5\' 11"  (1.803 m), weight 90.719 kg (200 lb), SpO2 97 %.  PHYSICAL EXAMINATION:   Physical Exam  Constitutional: He is oriented to person, place, and time and well-developed, well-nourished, and in no distress. No distress.  HENT:  Head: Normocephalic.  Eyes: No scleral icterus.  Neck: Normal range of motion. Neck supple. No JVD present. No tracheal deviation present.  Cardiovascular: Normal rate, regular rhythm and normal heart sounds.  Exam reveals no gallop and no friction rub.   No murmur heard.  Aortic valve click  Pulmonary/Chest: Effort normal and breath sounds normal. No respiratory distress. He has no wheezes. He has no rales. He exhibits no tenderness.  Abdominal: Soft. Bowel sounds are normal. He exhibits no distension and no mass. There is no tenderness. There is no  rebound and no guarding.  Musculoskeletal: Normal range of motion. He exhibits no edema.  Neurological: He is alert and oriented to person, place, and time.  Skin: Skin is warm. No rash noted. No erythema.  Psychiatric: Affect and judgment normal.      LABORATORY PANEL:   CBC  Recent Labs Lab 08/03/15 0420  WBC 5.0  HGB 7.5*  HCT 23.1*  PLT 287   ------------------------------------------------------------------------------------------------------------------  Chemistries   Recent Labs Lab 07/31/15 0917  08/03/15 0420  NA 132*  < > 132*  K 4.3  < > 3.7  CL 100*  < > 101  CO2 25  < > 26  GLUCOSE 122*  < > 94  BUN 23*  < > 10  CREATININE 0.92  < > 0.78  CALCIUM 8.8*  < > 8.2*  AST 27  --   --   ALT 17  --   --   ALKPHOS 82  --   --   BILITOT 1.0  --   --   < > = values in this interval not displayed. ------------------------------------------------------------------------------------------------------------------  Cardiac Enzymes  Recent Labs Lab 07/31/15 0917  TROPONINI <0.03   ------------------------------------------------------------------------------------------------------------------  RADIOLOGY:  No results found.   ASSESSMENT AND PLAN:   This 81 year old male with aortic valve replacement on Coumadin and history of gastric ulcer who presents again with GI bleed and symptomatic anemia.    1. Symptomatic anemia: Patient has received 2 units on admission. Hemoglobin dropped to 7.3 from 7.8 yesterday. I will consider transfusion tomorrow if hemoglobin is still low.   2. GI bleed: She underwent EGD which showed  gastritis but no ulcer. Patient will likely need capsule endoscopy to investigate further etiology of bleeding. I will start Coumadin and continue monitoring hemoglobin. Appreciate GI consultation.   3. Aortic valve placement: Coumadin is now being restarted. Patient may not be discharged until later level between 2-3. Continue to monitor  hemoglobin well on Coumadin.   4. Essential hypertension: Patient will continue outpatient medications including Norvasc and valsartan.  5. Depression/anxiety: Continue Xanax and citalopram.  6. BPH: Continue Flomax and Proscar.  7. Restless leg syndrome: Continue increased dose of Requip and add Valium when necessary 8. Hyponatremia: Improved   Management plans discussed with the patient and he is in agreement.  CODE STATUS: FULL  TOTAL TIME TAKING CARE OF THIS PATIENT: 25 minutes.     POSSIBLE D/C 3  days, DEPENDING ON CLINICAL CONDITION and INR.   Zayvion Stailey M.D on 08/03/2015 at 12:05 PM  Between 7am to 6pm - Pager - [phone] After 6pm go to [email or web address] - password [email or web address] [facility]  [facility]  Office  [phone]  CC: Primary care physician; [name], MD

## 2015-08-03 NOTE — Progress Notes (Cosign Needed)
    EGD on 9/7 showed gastritis. Biopsy taken for H pylori. GI recommends daily PPI given he will require continued Coumadin in the setting of his mechanical aortic valve. Unlikely candidate for repeat AVR for bioprosthetic valve given advanced age, though this could be assessed in the outpatient setting. HGB this morning 7.5 down from 7.9 day prior. Would aim to transfuse to HGB of 8 to 8.5 consistently prior to discharge and restarting Coumadin with a goal INR of 2-3. Discussed with Dr. Kirke Corin, MD.    Eula Listen, PA-C 08/03/2015 10:16 AM

## 2015-08-03 NOTE — Progress Notes (Signed)
ANTICOAGULATION CONSULT NOTE - Initial Consult  Pharmacy Consult for Warfarin Indication: Mechanical Aortic Valve  Allergies  Allergen Reactions  . Levofloxacin Nausea Only  . Sulfa Antibiotics Other (See Comments)    Reaction:  Unknown     Patient Measurements: Height:  (180.3 cm) Weight: 200 lb (90.719 kg) IBW/kg (Calculated) : 75.3   Vital Signs: Temp: 97.3 F (36.3 C) (09/08 1157) Temp Source: Oral (09/08 1157) BP: 157/59 mmHg (09/08 1157) Pulse Rate: 76 (09/08 1157)  Labs:  Recent Labs  08/01/15 0259 08/02/15 0432 08/03/15 0420  HGB 7.3* 7.9* 7.5*  HCT 22.9* 24.2* 23.1*  PLT 276 305 287  LABPROT  --  21.7*  --   INR  --  1.87  --   CREATININE 0.74 0.68 0.78    Estimated Creatinine Clearance: 83.5 mL/min (by C-G formula based on Cr of 0.78).   Medical History: Past Medical History  Diagnosis Date  . GERD (gastroesophageal reflux disease)   . Bradycardia     chronic, no symptoms 07/2010  . Depression   . Anxiety 10/11  . SOB (shortness of breath) 10/11    08/2010,Episodes at 5 AM, eventually felt to be anxiety, after complete workup including catheterization, pt greatly improved with anxiety meds 11/11  . Hypertension     BP higher than usual 04/19/10; amlodipine increased by telephone  . Decreased hearing     Right ear  . Coronary artery disease     mild, cath, 08/2010  . Aortic stenosis     a. s/p mechcanical AVR, 2002  . S/P AVR     a. St. Jude. mechanical 2002; b. echo 08/2010 EF 60%, trival AI, mild MR, AVR working well; c. on longterm warfarin tx  . Carotid bruit     dopplers in past, no abnormalities  . Gastric ulcer   . C. difficile colitis     Medications:  Scheduled:  . amLODipine  10 mg Oral Daily  . citalopram  20 mg Oral Daily  . finasteride  5 mg Oral Daily  . irbesartan  37.5 mg Oral Daily  . multivitamin with minerals  1 tablet Oral Daily  . pantoprazole  40 mg Oral Daily  . tamsulosin  0.4 mg Oral QHS  . traZODone   50 mg Oral QHS  . [START ON 08/04/2015] warfarin  5 mg Oral Once per day on Mon Wed Fri  . warfarin  7.5 mg Oral Once per day on Sun Tue Thu Sat  . Warfarin - Pharmacist Dosing Inpatient   Does not apply q1800    Assessment: 81 yo male admitted for possible GIB; INR 1.96 on admission; warfarin held d/t possible bleed.  EGD confirmed no active bleeding.  Home warfarin to be restarted tonight for AVR.   Goal of Therapy:  INR 2-3 Monitor platelets by anticoagulation protocol: Yes   Plan:  Restart home dose of warfarin 7.5mg  daily/5mg  MWF per last anticoag note on 8/31. PT/INR and CBC with AM labs. F/u tomorrow.   Jacqualyn Posey 08/03/2015,1:27 PM

## 2015-08-04 ENCOUNTER — Encounter: Payer: Self-pay | Admitting: Gastroenterology

## 2015-08-04 LAB — CBC
HCT: 23.9 % — ABNORMAL LOW (ref 40.0–52.0)
Hemoglobin: 7.7 g/dL — ABNORMAL LOW (ref 13.0–18.0)
MCH: 22.2 pg — ABNORMAL LOW (ref 26.0–34.0)
MCHC: 32 g/dL (ref 32.0–36.0)
MCV: 69.4 fL — ABNORMAL LOW (ref 80.0–100.0)
PLATELETS: 284 10*3/uL (ref 150–440)
RBC: 3.44 MIL/uL — ABNORMAL LOW (ref 4.40–5.90)
RDW: 21.1 % — AB (ref 11.5–14.5)
WBC: 6.1 10*3/uL (ref 3.8–10.6)

## 2015-08-04 LAB — PROTIME-INR
INR: 1.54
Prothrombin Time: 18.7 seconds — ABNORMAL HIGH (ref 11.4–15.0)

## 2015-08-04 MED ORDER — TRIAMCINOLONE ACETONIDE 0.1 % EX CREA: TOPICAL_CREAM | CUTANEOUS | Status: DC | PRN

## 2015-08-04 MED ORDER — WARFARIN SODIUM 5 MG PO TABS: 10.0000 mg | ORAL_TABLET | ORAL | Status: DC

## 2015-08-04 MED ORDER — WARFARIN SODIUM 5 MG PO TABS: 5.0000 mg | ORAL_TABLET | ORAL | Status: DC

## 2015-08-04 MED ORDER — WARFARIN SODIUM 5 MG PO TABS
10.0000 mg | ORAL_TABLET | Freq: Once | ORAL | Status: AC
  Administered 2015-08-04: 10 mg via ORAL
  Filled 2015-08-04: qty 2

## 2015-08-04 NOTE — Care Management Important Message (Signed)
Important Message  Patient Details  Name: [name] MRN: [id number] Date of Birth: 08/05/1931   Medicare Important Message Given:  Yes-second notification given    Eber Hong, RN 08/04/2015, 10:01 AM

## 2015-08-04 NOTE — Progress Notes (Signed)
[facility] Physicians - [place] at [facility]   PATIENT NAME: [name]    MR#:  [id number]  DATE OF BIRTH:  October 20, 1931  SUBJECTIVE:   doing well this morning. No issues. No hematochezia, coffee-ground emesis or melana REVIEW OF SYSTEMS:    Review of Systems  Constitutional: Negative for fever, chills and malaise/fatigue.  HENT: Negative for sore throat.   Eyes: Negative for blurred vision.  Respiratory: Negative for cough, hemoptysis, shortness of breath and wheezing.   Cardiovascular: Negative for chest pain, palpitations and leg swelling.  Gastrointestinal: Positive for constipation. Negative for nausea, vomiting, abdominal pain, diarrhea and blood in stool.  Genitourinary: Negative for dysuria.  Musculoskeletal: Negative for back pain.  Neurological: Negative for dizziness, tremors and headaches.  Endo/Heme/Allergies: Does not bruise/bleed easily.    Tolerating Diet: yes    DRUG ALLERGIES:   Allergies  Allergen Reactions  . Levofloxacin Nausea Only  . Sulfa Antibiotics Other (See Comments)    Reaction:  Unknown     VITALS:  Blood pressure 137/57, pulse 59, temperature 98.1 F (36.7 C), temperature source Oral, resp. rate 17, height  (1.803 m), weight 90.719 kg (200 lb), SpO2 96 %.  PHYSICAL EXAMINATION:   Physical Exam  Constitutional: He is oriented to person, place, and time and well-developed, well-nourished, and in no distress. No distress.  HENT:  Head: Normocephalic.  Eyes: No scleral icterus.  Neck: Normal range of motion. Neck supple. No JVD present. No tracheal deviation present.  Cardiovascular: Normal rate, regular rhythm and normal heart sounds.  Exam reveals no gallop and no friction rub.   No murmur heard.  Aortic valve click  Pulmonary/Chest: Effort normal and breath sounds normal. No respiratory distress. He has no wheezes. He has no rales. He exhibits no tenderness.  Abdominal: Soft. Bowel sounds are normal. He  exhibits no distension and no mass. There is no tenderness. There is no rebound and no guarding.  Musculoskeletal: Normal range of motion. He exhibits no edema.  Neurological: He is alert and oriented to person, place, and time.  Skin: Skin is warm. No rash noted. No erythema.  Psychiatric: Affect and judgment normal.      LABORATORY PANEL:   CBC  Recent Labs Lab 08/04/15 0412  WBC 6.1  HGB 7.7*  HCT 23.9*  PLT 284   ------------------------------------------------------------------------------------------------------------------  Chemistries   Recent Labs Lab 07/31/15 0917  08/03/15 0420  NA 132*  < > 132*  K 4.3  < > 3.7  CL 100*  < > 101  CO2 25  < > 26  GLUCOSE 122*  < > 94  BUN 23*  < > 10  CREATININE 0.92  < > 0.78  CALCIUM 8.8*  < > 8.2*  AST 27  --   --   ALT 17  --   --   ALKPHOS 82  --   --   BILITOT 1.0  --   --   < > = values in this interval not displayed. ------------------------------------------------------------------------------------------------------------------  Cardiac Enzymes  Recent Labs Lab 07/31/15 0917  TROPONINI <0.03   ------------------------------------------------------------------------------------------------------------------  RADIOLOGY:  No results found.   ASSESSMENT AND PLAN:   This 81 year old male with aortic valve replacement on Coumadin and history of gastric ulcer who presents again with GI bleed and symptomatic anemia.    1. Symptomatic anemia: Patient has received 2 units on admission.  Hemoglobin 7.7 today. Continue to monitor.  2. GI bleed: She underwent EGD which showed gastritis but  no ulcer. Patient will likely need capsule endoscopy to investigate further etiology of bleeding. I will start Coumadin and continue  To monitorhemoglobin. Appreciate GI consultation.   3. Aortic valve placement: Coumadin is now being restarted. Patient may not be discharged until later level between 2-3. Continue to monitor  hemoglobin well on Coumadin.   4. Essential hypertension: Patient will continue outpatient medications including Norvasc and valsartan.  5. Depression/anxiety: Continue Xanax and citalopram.  6. BPH: Continue Flomax and Proscar.  7. Restless leg syndrome: Continue increased dose of Requip and add Valium when necessary 8. Hyponatremia: Improved   9. Constipation: patient will receive laxatives today.   Management plans discussed with the patient and he is in agreement.  CODE STATUS: FULL  TOTAL TIME TAKING CARE OF THIS PATIENT: 20 minutes.     POSSIBLE D/C 2  days, DEPENDING ON CLINICAL CONDITION and INR.   Maliea Grandmaison M.D on 08/04/2015 at 10:52 AM  Between 7am to 6pm - Pager - [phone] After 6pm go to [email or web address] - password [email or web address] [facility]  [place] [facility]  Office  [phone]  CC: Primary care physician; [name], MD

## 2015-08-04 NOTE — Anesthesia Postprocedure Evaluation (Signed)
  Anesthesia Post-op Note  Patient: [name]  Procedure(s) Performed: Procedure(s): ESOPHAGOGASTRODUODENOSCOPY (EGD) (N/A)  Anesthesia type:General  Patient location: PACU  Post pain: Pain level controlled  Post assessment: Post-op Vital signs reviewed, Patient's Cardiovascular Status Stable, Respiratory Function Stable, Patent Airway and No signs of Nausea or vomiting  Post vital signs: Reviewed and stable  Last Vitals:  Filed Vitals:   08/04/15 1153  BP: 153/76  Pulse: 79  Temp: 36.6 C  Resp: 18    Level of consciousness: awake, alert  and patient cooperative  Complications: No apparent anesthesia complications

## 2015-08-04 NOTE — Progress Notes (Signed)
ANTICOAGULATION CONSULT NOTE - Initial Consult  Pharmacy Consult for Warfarin Indication: Mechanical Aortic Valve  Allergies  Allergen Reactions  . Levofloxacin Nausea Only  . Sulfa Antibiotics Other (See Comments)    Reaction:  Unknown     Patient Measurements: Height:  (180.3 cm) Weight: 200 lb (90.719 kg) IBW/kg (Calculated) : 75.3   Vital Signs: Temp: 98.1 F (36.7 C) (09/09 0522) Temp Source: Oral (09/09 0522) BP: 137/57 mmHg (09/09 0522) Pulse Rate: 59 (09/09 0522)  Labs:  Recent Labs  08/02/15 0432 08/03/15 0420 08/04/15 0412  HGB 7.9* 7.5* 7.7*  HCT 24.2* 23.1* 23.9*  PLT 305 287 284  LABPROT 21.7*  --  18.7*  INR 1.87  --  1.54  CREATININE 0.68 0.78  --     Estimated Creatinine Clearance: 83.5 mL/min (by C-G formula based on Cr of 0.78).   Medical History: Past Medical History  Diagnosis Date  . GERD (gastroesophageal reflux disease)   . Bradycardia     chronic, no symptoms 07/2010  . Depression   . Anxiety 10/11  . SOB (shortness of breath) 10/11    08/2010,Episodes at 5 AM, eventually felt to be anxiety, after complete workup including catheterization, pt greatly improved with anxiety meds 11/11  . Hypertension     BP higher than usual 04/19/10; amlodipine increased by telephone  . Decreased hearing     Right ear  . Coronary artery disease     mild, cath, 08/2010  . Aortic stenosis     a. s/p mechcanical AVR, 2002  . S/P AVR     a. St. Jude. mechanical 2002; b. echo 08/2010 EF 60%, trival AI, mild MR, AVR working well; c. on longterm warfarin tx  . Carotid bruit     dopplers in past, no abnormalities  . Gastric ulcer   . C. difficile colitis     Medications:  Scheduled:  . amLODipine  10 mg Oral Daily  . citalopram  20 mg Oral Daily  . finasteride  5 mg Oral Daily  . irbesartan  37.5 mg Oral Daily  . multivitamin with minerals  1 tablet Oral Daily  . pantoprazole  40 mg Oral Daily  . tamsulosin  0.4 mg Oral QHS  . traZODone   50 mg Oral QHS  . warfarin  5 mg Oral Once per day on Mon Wed Fri  . warfarin  7.5 mg Oral Once per day on Sun Tue Thu Sat  . Warfarin - Pharmacist Dosing Inpatient   Does not apply q1800    Assessment: 81 yo male admitted for possible GIB; INR 1.96 on admission; warfarin held d/t possible bleed.  EGD confirmed no active bleeding.  Home warfarin to be restarted tonight for AVR.   Goal of Therapy:  INR 2-3 Monitor platelets by anticoagulation protocol: Yes   Plan:  Restart home dose of warfarin 7.5mg  daily/5mg  MWF per last anticoag note on 8/31. PT/INR and CBC with AM labs. F/u tomorrow.   9/9 INR 1.54. Continue current regimen and recheck in AM.  Seanna Sisler S 08/04/2015,5:48 AM

## 2015-08-04 NOTE — Progress Notes (Signed)
ANTICOAGULATION CONSULT NOTE - Initial Consult  Pharmacy Consult for Warfarin Indication: Mechanical Aortic Valve  Allergies  Allergen Reactions  . Levofloxacin Nausea Only  . Sulfa Antibiotics Other (See Comments)    Reaction:  Unknown     Patient Measurements: Height:  (180.3 cm) Weight: 200 lb (90.719 kg) IBW/kg (Calculated) : 75.3   Vital Signs: Temp: 97.8 F (36.6 C) (09/09 1153) Temp Source: Oral (09/09 1153) BP: 153/76 mmHg (09/09 1153) Pulse Rate: 79 (09/09 1153)  Labs:  Recent Labs  08/02/15 0432 08/03/15 0420 08/04/15 0412  HGB 7.9* 7.5* 7.7*  HCT 24.2* 23.1* 23.9*  PLT 305 287 284  LABPROT 21.7*  --  18.7*  INR 1.87  --  1.54  CREATININE 0.68 0.78  --     Estimated Creatinine Clearance: 83.5 mL/min (by C-G formula based on Cr of 0.78).   Medical History: Past Medical History  Diagnosis Date  . GERD (gastroesophageal reflux disease)   . Bradycardia     chronic, no symptoms 07/2010  . Depression   . Anxiety 10/11  . SOB (shortness of breath) 10/11    08/2010,Episodes at 5 AM, eventually felt to be anxiety, after complete workup including catheterization, pt greatly improved with anxiety meds 11/11  . Hypertension     BP higher than usual 04/19/10; amlodipine increased by telephone  . Decreased hearing     Right ear  . Coronary artery disease     mild, cath, 08/2010  . Aortic stenosis     a. s/p mechcanical AVR, 2002  . S/P AVR     a. St. Jude. mechanical 2002; b. echo 08/2010 EF 60%, trival AI, mild MR, AVR working well; c. on longterm warfarin tx  . Carotid bruit     dopplers in past, no abnormalities  . Gastric ulcer   . C. difficile colitis     Medications:  Scheduled:  . amLODipine  10 mg Oral Daily  . citalopram  20 mg Oral Daily  . finasteride  5 mg Oral Daily  . irbesartan  37.5 mg Oral Daily  . multivitamin with minerals  1 tablet Oral Daily  . pantoprazole  40 mg Oral Daily  . tamsulosin  0.4 mg Oral QHS  . traZODone   50 mg Oral QHS  . warfarin  10 mg Oral Once per day on Mon Wed Fri  . warfarin  7.5 mg Oral Once per day on Sun Tue Thu Sat  . Warfarin - Pharmacist Dosing Inpatient   Does not apply q1800    Assessment: 81 yo male admitted for possible GIB; INR 1.96 on admission; warfarin held d/t possible bleed.  EGD confirmed no active bleeding. INR on admission 1.96, coumadin held since admission on 9/5. Home warfarin to be restarted 9/9  9/7 - INR 1.87, no coumadin given 9/8 - given coumadin 7.5mg  9/9 - INR 1.54  Goal of Therapy:  INR 2-3 Monitor platelets by anticoagulation protocol: Yes   Plan:  Restarted home dose of warfarin 7.5mg  daily/5mg  MWF per last anticoag note on 8/31.   Discussed coumadin dosing with MD, would like to give  x1 today. Needs INR > 2 to discharge. Will give  x 1 today then resume home dosing as above. Will recheck INR with AM labs.   Garlon Hatchet, PharmD Clinical Pharmacist  08/04/2015 2:27 PM

## 2015-08-04 NOTE — Care Management (Signed)
Patient's hgb is 7.5.  INR is lower than 9/7 but attending indicates that is because it had been held for so long. Coumadin dose increased today.   Barrier to discharge is the need to have INR at therapeutic level due to mechanical valve.  He needs to be monitored closely for bleeding while the process continues.  Patient denies needing any services at [facility] at discharge.  Discussed need to prevent any loss of functional status with primary nurse

## 2015-08-04 NOTE — Progress Notes (Signed)
Initial Nutrition Assessment   INTERVENTION:   Meals and Snacks: Cater to patient preferences  NUTRITION DIAGNOSIS:   No nutrition diagnosis at this time  GOAL:   Patient will meet greater than or equal to 90% of their needs   MONITOR:    (Energy Intake, Anthropometrics, Electrolyte/Renal Profile, Glucose Profile, Digestive System)  REASON FOR ASSESSMENT:   LOS    ASSESSMENT:    Pt admitted with symptomatic anemia, EGD showing gastritis, no ulcers  Past Medical History  Diagnosis Date  . GERD (gastroesophageal reflux disease)   . Bradycardia     chronic, no symptoms 07/2010  . Depression   . Anxiety 10/11  . SOB (shortness of breath) 10/11    08/2010,Episodes at 5 AM, eventually felt to be anxiety, after complete workup including catheterization, pt greatly improved with anxiety meds 11/11  . Hypertension     BP higher than usual 04/19/10; amlodipine increased by telephone  . Decreased hearing     Right ear  . Coronary artery disease     mild, cath, 08/2010  . Aortic stenosis     a. s/p mechcanical AVR, 2002  . S/P AVR     a. St. Jude. mechanical 2002; b. echo 08/2010 EF 60%, trival AI, mild MR, AVR working well; c. on longterm warfarin tx  . Carotid bruit     dopplers in past, no abnormalities  . Gastric ulcer   . C. difficile colitis     Diet Order:  Diet regular Room service appropriate?: Yes; Fluid consistency:: Thin   Energy Intake: recorded po in take 76% on average, appetite good  Electrolyte and Renal Profile:  Recent Labs Lab 08/01/15 0259 08/02/15 0432 08/03/15 0420  BUN CREATININE 0.74 0.68 0.78  NA 131* 129* 132*  K 4.2 3.9 3.7   Glucose Profile: No results for input(s): GLUCAP in the last 72 hours. Protein Profile:  Recent Labs Lab 07/31/15 0917  ALBUMIN 4.1   Meds: MVI, coumadin  Skin:  Reviewed, no issues  Last BM:  9/5, MD aware, pt on bowel regimen  Height:   Ht Readings from Last 1 Encounters:  07/31/15 5'  11" (1.803 m)    Weight: appears relatively stable over past few years  Wt Readings from Last 1 Encounters:  07/31/15 200 lb (90.719 kg)    Wt Readings from Last 10 Encounters:  07/31/15 200 lb (90.719 kg)  05/26/15 185 lb 6.4 oz (84.097 kg)  04/16/15 198 lb 13.7 oz (90.2 kg)  04/02/15 190 lb (86.183 kg)  03/24/15 179 lb 14.3 oz (81.6 kg)  09/30/14 195 lb (88.451 kg)  11/05/13 196 lb (88.905 kg)  04/05/13 183 lb (83.008 kg)  11/05/12 183 lb (83.008 kg)  03/25/12 191 lb 12.8 oz (87 kg)    BMI:  Body mass index is 27.91 kg/(m^2).  LOW Care Level  Romelle Starcher MS, [place], LDN [phone] Pager

## 2015-08-05 LAB — PROTIME-INR
INR: 1.5
INR: 1.46
PROTHROMBIN TIME: 17.9 s — AB (ref 11.4–15.0)
Prothrombin Time: 18.3 seconds — ABNORMAL HIGH (ref 11.4–15.0)

## 2015-08-05 MED ORDER — WARFARIN SODIUM 5 MG PO TABS: 5.0000 mg | ORAL_TABLET | ORAL | Status: DC

## 2015-08-05 MED ORDER — WARFARIN SODIUM 5 MG PO TABS
10.0000 mg | ORAL_TABLET | Freq: Once | ORAL | Status: AC
  Administered 2015-08-05: 10 mg via ORAL
  Filled 2015-08-05: qty 2

## 2015-08-05 MED ORDER — WARFARIN SODIUM 5 MG PO TABS: 7.5000 mg | ORAL_TABLET | ORAL | Status: DC

## 2015-08-05 MED ORDER — WARFARIN SODIUM 5 MG PO TABS: 10.0000 mg | ORAL_TABLET | ORAL | Status: DC

## 2015-08-05 NOTE — Progress Notes (Signed)
ANTICOAGULATION CONSULT NOTE - FOLLOW UP   Pharmacy Consult for Warfarin Indication: Mechanical Aortic Valve  Allergies  Allergen Reactions  . Levofloxacin Nausea Only  . Sulfa Antibiotics Other (See Comments)    Reaction:  Unknown     Patient Measurements: Height:  (180.3 cm) Weight: 200 lb (90.719 kg) IBW/kg (Calculated) : 75.3   Vital Signs:    Labs:  Recent Labs  08/03/15 0420 08/04/15 0412 08/05/15 0515  HGB 7.5* 7.7*  --   HCT 23.1* 23.9*  --   PLT 287 284  --   LABPROT  --  18.7* 18.3*  INR  --  1.54 1.50  CREATININE 0.78  --   --     Estimated Creatinine Clearance: 83.5 mL/min (by C-G formula based on Cr of 0.78).   Medical History: Past Medical History  Diagnosis Date  . GERD (gastroesophageal reflux disease)   . Bradycardia     chronic, no symptoms 07/2010  . Depression   . Anxiety 10/11  . SOB (shortness of breath) 10/11    08/2010,Episodes at 5 AM, eventually felt to be anxiety, after complete workup including catheterization, pt greatly improved with anxiety meds 11/11  . Hypertension     BP higher than usual 04/19/10; amlodipine increased by telephone  . Decreased hearing     Right ear  . Coronary artery disease     mild, cath, 08/2010  . Aortic stenosis     a. s/p mechcanical AVR, 2002  . S/P AVR     a. St. Jude. mechanical 2002; b. echo 08/2010 EF 60%, trival AI, mild MR, AVR working well; c. on longterm warfarin tx  . Carotid bruit     dopplers in past, no abnormalities  . Gastric ulcer   . C. difficile colitis     Medications:  Scheduled:  . amLODipine  10 mg Oral Daily  . citalopram  20 mg Oral Daily  . finasteride  5 mg Oral Daily  . irbesartan  37.5 mg Oral Daily  . multivitamin with minerals  1 tablet Oral Daily  . pantoprazole  40 mg Oral Daily  . tamsulosin  0.4 mg Oral QHS  . traZODone  50 mg Oral QHS  . warfarin  10 mg Oral ONCE-1800  . [START ON 08/06/2015] warfarin  5 mg Oral Once per day on Sun Tue Thu Sat  .  [START ON 08/07/2015] warfarin  7.5 mg Oral Once per day on Mon Wed Fri  . Warfarin - Pharmacist Dosing Inpatient   Does not apply q1800    Assessment: 81 yo male admitted for possible GIB; INR 1.96 on admission; warfarin held d/t possible bleed.  EGD confirmed no active bleeding. INR on admission 1.96, coumadin held since admission on 9/5. Home warfarin to be restarted 9/9  9/7 - INR 1.87, no coumadin given 9/8 - given coumadin 7.5mg  9/9 - INR 1.54  Goal of Therapy:  INR 2-3 Monitor platelets by anticoagulation protocol: Yes   Plan:  Restarted home dose of warfarin 7.5mg  daily/5mg  MWF per last anticoag note on 8/31.   Discussed coumadin dosing with MD, would like to give  x1 today. Needs INR > 2 to discharge. Will give  x 1 today then resume home dosing as above. Will recheck INR with AM labs.   9/10 AM INR 1.50. Will give warfarin 10 mg PO x 1 then will start home dose thereafter.   Demetrius Charity, PharmD 08/05/2015

## 2015-08-05 NOTE — Progress Notes (Signed)
[facility] [place] [facility] - [place] at [facility]   PATIENT NAME: [name]    MR#:  [id number]  DATE OF BIRTH:  17-Aug-1931  SUBJECTIVE:   Patient is in normal state of health although he is upset that he cannot be discharged today. INR is 1.5 this morning.  REVIEW OF SYSTEMS:    Review of Systems  Constitutional: Negative for fever, chills and malaise/fatigue.  HENT: Negative for sore throat.   Eyes: Negative for blurred vision.  Respiratory: Negative for cough, hemoptysis, shortness of breath and wheezing.   Cardiovascular: Negative for chest pain, palpitations and leg swelling.  Gastrointestinal: Negative for nausea, vomiting, abdominal pain, diarrhea, constipation and blood in stool.  Genitourinary: Negative for dysuria.  Musculoskeletal: Negative for back pain.  Neurological: Negative for dizziness, tremors and headaches.  Endo/Heme/Allergies: Does not bruise/bleed easily.    Tolerating Diet: yes    DRUG ALLERGIES:   Allergies  Allergen Reactions  . Levofloxacin Nausea Only  . Sulfa Antibiotics Other (See Comments)    Reaction:  Unknown     VITALS:  Blood pressure 150/60, pulse 67, temperature 97.5 F (36.4 C), temperature source Oral, resp. rate 23, height  (1.803 m), weight 90.719 kg (200 lb), SpO2 99 %.  PHYSICAL EXAMINATION:   Physical Exam  Constitutional: He is oriented to person, place, and time and well-developed, well-nourished, and in no distress. No distress.  HENT:  Head: Normocephalic.  Eyes: No scleral icterus.  Neck: Normal range of motion. Neck supple. No JVD present. No tracheal deviation present.  Cardiovascular: Normal rate, regular rhythm and normal heart sounds.  Exam reveals no gallop and no friction rub.   No murmur heard.  Aortic valve click  Pulmonary/Chest: Effort normal and breath sounds normal. No respiratory distress. He has no wheezes. He has no rales. He exhibits no tenderness.  Abdominal: Soft. Bowel  sounds are normal. He exhibits no distension and no mass. There is no tenderness. There is no rebound and no guarding.  Musculoskeletal: Normal range of motion. He exhibits no edema.  Neurological: He is alert and oriented to person, place, and time.  Skin: Skin is warm. No rash noted. No erythema.  Psychiatric: Affect and judgment normal.      LABORATORY PANEL:   CBC  Recent Labs Lab 08/04/15 0412  WBC 6.1  HGB 7.7*  HCT 23.9*  PLT 284   ------------------------------------------------------------------------------------------------------------------  Chemistries   Recent Labs Lab 07/31/15 0917  08/03/15 0420  NA 132*  < > 132*  K 4.3  < > 3.7  CL 100*  < > 101  CO2 25  < > 26  GLUCOSE 122*  < > 94  BUN 23*  < > 10  CREATININE 0.92  < > 0.78  CALCIUM 8.8*  < > 8.2*  AST 27  --   --   ALT 17  --   --   ALKPHOS 82  --   --   BILITOT 1.0  --   --   < > = values in this interval not displayed. ------------------------------------------------------------------------------------------------------------------  Cardiac Enzymes  Recent Labs Lab 07/31/15 0917  TROPONINI <0.03   ------------------------------------------------------------------------------------------------------------------  RADIOLOGY:  No results found.   ASSESSMENT AND PLAN:   This 81 year old male with aortic valve replacement on Coumadin and history of gastric ulcer who presents again with GI bleed and symptomatic anemia.    1. Symptomatic anemia: Patient has received 2 units on admission.  Continue to monitor hemoglobin. 2. GI bleed: She  underwent EGD which showed gastritis but no ulcer. Patient will likely need capsule endoscopy to investigate further etiology of bleeding this should be ordered by outpatient GI physician. Patient is receiving Coumadin however not Lovenox due to his GI bleed. She will need daily PPI since he will be on Coumadin lifelong.  3. Aortic valve placement: INR is  1.5 today. Patient is not receiving Lovenox due to GI bleed. Continue to monitor INR level. Patient may not be discharged until later level between 2-3.   4. Essential hypertension: Patient will continue outpatient medications including Norvasc and valsartan.  5. Depression/anxiety: Continue Xanax and citalopram.  6. BPH: Continue Flomax and Proscar.  7. Restless leg syndrome: Continue increased dose of Requip and add Valium when necessary 8. Hyponatremia: Improved   9. Constipation: Resolved. I'll   Management plans discussed with the patient and son and  he is in agreement.  CODE STATUS: FULL  TOTAL TIME TAKING CARE OF THIS PATIENT: 20 minutes.     POSSIBLE D/C 2  days, DEPENDING ON CLINICAL CONDITION and INR.   Gurshaan Matsuoka M.D on 08/05/2015 at 11:40 AM  Between 7am to 6pm - Pager - [phone] After 6pm go to [email or web address] - password [email or web address] [facility]  [place]  [facility]  Office  [phone]  CC: Primary care physician; [name], MD

## 2015-08-05 NOTE — Progress Notes (Signed)
Patient refusing bed and chair alarm at this time. Educated on importance of fall prevention while in hospital, pt continues to refuse alarm. Educated provided to patient, should he feel dizzy, lightheaded or etc. While ambulating or standing up to call the nurse or aide so that patient does not move unassisted with these symptoms. Patient verbalizes understanding of these instructions.

## 2015-08-05 NOTE — Progress Notes (Signed)
ANTICOAGULATION CONSULT NOTE - Initial Consult  Pharmacy Consult for Warfarin Indication: Mechanical Aortic Valve  Allergies  Allergen Reactions  . Levofloxacin Nausea Only  . Sulfa Antibiotics Other (See Comments)    Reaction:  Unknown     Patient Measurements: Height:  (180.3 cm) Weight: 200 lb (90.719 kg) IBW/kg (Calculated) : 75.3   Vital Signs: Temp: 97.5 F (36.4 C) (09/09 1930) Temp Source: Oral (09/09 1930) BP: 150/60 mmHg (09/09 1930) Pulse Rate: 67 (09/09 1930)  Labs:  Recent Labs  08/03/15 0420 08/04/15 0412 08/05/15 0515  HGB 7.5* 7.7*  --   HCT 23.1* 23.9*  --   PLT 287 284  --   LABPROT  --  18.7* 18.3*  INR  --  1.54 1.50  CREATININE 0.78  --   --     Estimated Creatinine Clearance: 83.5 mL/min (by C-G formula based on Cr of 0.78).   Medical History: Past Medical History  Diagnosis Date  . GERD (gastroesophageal reflux disease)   . Bradycardia     chronic, no symptoms 07/2010  . Depression   . Anxiety 10/11  . SOB (shortness of breath) 10/11    08/2010,Episodes at 5 AM, eventually felt to be anxiety, after complete workup including catheterization, pt greatly improved with anxiety meds 11/11  . Hypertension     BP higher than usual 04/19/10; amlodipine increased by telephone  . Decreased hearing     Right ear  . Coronary artery disease     mild, cath, 08/2010  . Aortic stenosis     a. s/p mechcanical AVR, 2002  . S/P AVR     a. St. Jude. mechanical 2002; b. echo 08/2010 EF 60%, trival AI, mild MR, AVR working well; c. on longterm warfarin tx  . Carotid bruit     dopplers in past, no abnormalities  . Gastric ulcer   . C. difficile colitis     Medications:  Scheduled:  . amLODipine  10 mg Oral Daily  . citalopram  20 mg Oral Daily  . finasteride  5 mg Oral Daily  . irbesartan  37.5 mg Oral Daily  . multivitamin with minerals  1 tablet Oral Daily  . pantoprazole  40 mg Oral Daily  . tamsulosin  0.4 mg Oral QHS  . traZODone   50 mg Oral QHS  . [START ON 08/07/2015] warfarin  5 mg Oral Once per day on Mon Wed Fri  . warfarin  7.5 mg Oral Once per day on Sun Tue Thu Sat  . Warfarin - Pharmacist Dosing Inpatient   Does not apply q1800    Assessment: 81 yo male admitted for possible GIB; INR 1.96 on admission; warfarin held d/t possible bleed.  EGD confirmed no active bleeding. INR on admission 1.96, coumadin held since admission on 9/5. Home warfarin to be restarted 9/9  9/7 - INR 1.87, no coumadin given 9/8 - given coumadin 7.5mg  9/9 - INR 1.54  Goal of Therapy:  INR 2-3 Monitor platelets by anticoagulation protocol: Yes   Plan:  Restarted home dose of warfarin 7.5mg  daily/5mg  MWF per last anticoag note on 8/31.   Discussed coumadin dosing with MD, would like to give  x1 today. Needs INR > 2 to discharge. Will give  x 1 today then resume home dosing as above. Will recheck INR with AM labs.   9/10 AM INR 1.50. Continue current regimen as above. INR in AM.  Fulton Reek, PharmD, BCPS  08/05/2015

## 2015-08-06 LAB — CBC
HEMATOCRIT: 26.8 % — AB (ref 40.0–52.0)
HEMOGLOBIN: 8.1 g/dL — AB (ref 13.0–18.0)
MCH: 20.9 pg — AB (ref 26.0–34.0)
MCHC: 30.1 g/dL — AB (ref 32.0–36.0)
MCV: 69.4 fL — AB (ref 80.0–100.0)
Platelets: 301 10*3/uL (ref 150–440)
RBC: 3.86 MIL/uL — ABNORMAL LOW (ref 4.40–5.90)
RDW: 20.9 % — ABNORMAL HIGH (ref 11.5–14.5)
WBC: 5.3 10*3/uL (ref 3.8–10.6)

## 2015-08-06 LAB — SURGICAL PATHOLOGY

## 2015-08-06 LAB — PROTIME-INR
INR: 1.67
Prothrombin Time: 19.9 seconds — ABNORMAL HIGH (ref 11.4–15.0)

## 2015-08-06 MED ORDER — WARFARIN SODIUM 5 MG PO TABS
10.0000 mg | ORAL_TABLET | Freq: Once | ORAL | Status: AC
  Administered 2015-08-06: 10 mg via ORAL
  Filled 2015-08-06: qty 2

## 2015-08-06 MED ORDER — WARFARIN SODIUM 5 MG PO TABS: 5.0000 mg | ORAL_TABLET | Freq: Once | ORAL | Status: DC

## 2015-08-06 MED ORDER — WARFARIN SODIUM 5 MG PO TABS: 5.0000 mg | ORAL_TABLET | ORAL | Status: DC

## 2015-08-06 NOTE — Progress Notes (Signed)
[facility] - [place] at [facility]   PATIENT NAME: [name]    MR#:  [id number]  DATE OF BIRTH:  11-21-1931  SUBJECTIVE:   Patient is in normal state of health , no complains- INR 1.67  REVIEW OF SYSTEMS:    Review of Systems  Constitutional: Negative for fever, chills and malaise/fatigue.  HENT: Negative for sore throat.   Eyes: Negative for blurred vision.  Respiratory: Negative for cough, hemoptysis, shortness of breath and wheezing.   Cardiovascular: Negative for chest pain, palpitations and leg swelling.  Gastrointestinal: Negative for nausea, vomiting, abdominal pain, diarrhea, constipation and blood in stool.  Genitourinary: Negative for dysuria.  Musculoskeletal: Negative for back pain.  Neurological: Negative for dizziness, tremors and headaches.  Endo/Heme/Allergies: Does not bruise/bleed easily.    Tolerating Diet: yes   DRUG ALLERGIES:   Allergies  Allergen Reactions  . Levofloxacin Nausea Only  . Sulfa Antibiotics Other (See Comments)    Reaction:  Unknown     VITALS:  Blood pressure 141/56, pulse 61, temperature 97.9 F (36.6 C), temperature source Oral, resp. rate 18, height 5\' 11"  (1.803 m), weight 90.719 kg (200 lb), SpO2 95 %.  PHYSICAL EXAMINATION:   Physical Exam  Constitutional: He is oriented to person, place, and time and well-developed, well-nourished, and in no distress. No distress.  HENT:  Head: Normocephalic.  Eyes: No scleral icterus.  Neck: Normal range of motion. Neck supple. No JVD present. No tracheal deviation present.  Cardiovascular: Normal rate, regular rhythm and normal heart sounds.  Exam reveals no gallop and no friction rub.   No murmur heard.  Aortic valve click  Pulmonary/Chest: Effort normal and breath sounds normal. No respiratory distress. He has no wheezes. He has no rales. He exhibits no tenderness.  Abdominal: Soft. Bowel sounds are normal. He exhibits no distension and no mass. There  is no tenderness. There is no rebound and no guarding.  Musculoskeletal: Normal range of motion. He exhibits no edema.  Neurological: He is alert and oriented to person, place, and time.  Skin: Skin is warm. No rash noted. No erythema.  Psychiatric: Affect and judgment normal.    LABORATORY PANEL:   CBC  Recent Labs Lab 08/06/15 0408  WBC 5.3  HGB 8.1*  HCT 26.8*  PLT 301   ------------------------------------------------------------------------------------------------------------------  Chemistries   Recent Labs Lab 07/31/15 0917  08/03/15 0420  NA 132*  < > 132*  K 4.3  < > 3.7  CL 100*  < > 101  CO2 25  < > 26  GLUCOSE 122*  < > 94  BUN 23*  < > 10  CREATININE 0.92  < > 0.78  CALCIUM 8.8*  < > 8.2*  AST 27  --   --   ALT 17  --   --   ALKPHOS 82  --   --   BILITOT 1.0  --   --   < > = values in this interval not displayed. ------------------------------------------------------------------------------------------------------------------  Cardiac Enzymes  Recent Labs Lab 07/31/15 0917  TROPONINI <0.03   ------------------------------------------------------------------------------------------------------------------  RADIOLOGY:  No results found.   ASSESSMENT AND PLAN:   This 81 year old male with aortic valve replacement on Coumadin and history of gastric ulcer who presents again with GI bleed and symptomatic anemia.    1. Symptomatic anemia: Patient has received 2 units on admission.  Continue to monitor hemoglobin. 2. GI bleed: She underwent EGD which showed gastritis but no ulcer. Patient will likely need capsule  endoscopy to investigate further etiology of bleeding this should be ordered by outpatient GI physician. Patient is receiving Coumadin however not Lovenox due to his GI bleed. She will need daily PPI since he will be on Coumadin lifelong.  3. Aortic valve placement: INR is 1.67 today. Patient is not receiving Lovenox due to GI bleed.  Continue to monitor INR level. Patient may not be discharged until later level between 2-3. Which may be next 2 days.  4. Essential hypertension: Patient will continue outpatient medications including Norvasc and valsartan.  5. Depression/anxiety: Continue Xanax and citalopram.  6. BPH: Continue Flomax and Proscar.  7. Restless leg syndrome: Continue increased dose of Requip and add Valium when necessary 8. Hyponatremia: Improved   9. Constipation: Resolved. I'll   Management plans discussed with the patient and son and  he is in agreement.  CODE STATUS: FULL  TOTAL TIME TAKING CARE OF THIS PATIENT: 20 minutes.   POSSIBLE D/C 2  days, DEPENDING ON CLINICAL CONDITION and INR.   Altamese Dilling M.D on 08/06/2015 at 10:43 AM  Between 7am to 6pm - Pager - [phone] After 6pm go to [email or web address] - password [email or web address] [facility]  [facility]  Office  [phone]  CC: Primary care physician; [name], MD

## 2015-08-06 NOTE — Progress Notes (Signed)
Patient IV access infiltrated. This RN attempted new access, Fola RN attempted new access, Elnita Maxwell RN attempted 2 new accesses with no success. Pt reports very long history of difficult IV access. Dr. Elisabeth Pigeon paged to see if it is OK for patient to not have IV access. Dr. Elisabeth Pigeon said it is OK if patient has no IV access since he is close to discharge and not receiving any medication via IV.

## 2015-08-06 NOTE — Progress Notes (Addendum)
ANTICOAGULATION CONSULT NOTE - FOLLOW UP   Pharmacy Consult for Warfarin Indication: Mechanical Aortic Valve  Allergies  Allergen Reactions  . Levofloxacin Nausea Only  . Sulfa Antibiotics Other (See Comments)    Reaction:  Unknown     Patient Measurements: Height: 5\' 11"  (180.3 cm) Weight: 200 lb (90.719 kg) IBW/kg (Calculated) : 75.3   Vital Signs: Temp: 97.9 F (36.6 C) (09/11 0442) Temp Source: Oral (09/11 0442) BP: 141/56 mmHg (09/11 0442) Pulse Rate: 61 (09/11 0442)  Labs:  Recent Labs  08/04/15 0412 08/05/15 0515 08/05/15 1154 08/06/15 0408  HGB 7.7*  --   --  8.1*  HCT 23.9*  --   --  26.8*  PLT 284  --   --  301  LABPROT 18.7* 18.3* 17.9* 19.9*  INR 1.54 1.50 1.46 1.67    Estimated Creatinine Clearance: 83.5 mL/min (by C-G formula based on Cr of 0.78).   Medical History: Past Medical History  Diagnosis Date  . GERD (gastroesophageal reflux disease)   . Bradycardia     chronic, no symptoms 07/2010  . Depression   . Anxiety 10/11  . SOB (shortness of breath) 10/11    08/2010,Episodes at 5 AM, eventually felt to be anxiety, after complete workup including catheterization, pt greatly improved with anxiety meds 11/11  . Hypertension     BP higher than usual 04/19/10; amlodipine increased by telephone  . Decreased hearing     Right ear  . Coronary artery disease     mild, cath, 08/2010  . Aortic stenosis     a. s/p mechcanical AVR, 2002  . S/P AVR     a. St. Jude. mechanical 2002; b. echo 08/2010 EF 60%, trival AI, mild MR, AVR working well; c. on longterm warfarin tx  . Carotid bruit     dopplers in past, no abnormalities  . Gastric ulcer   . C. difficile colitis     Medications:  Scheduled:  . amLODipine  10 mg Oral Daily  . citalopram  20 mg Oral Daily  . finasteride  5 mg Oral Daily  . irbesartan  37.5 mg Oral Daily  . multivitamin with minerals  1 tablet Oral Daily  . pantoprazole  40 mg Oral Daily  . tamsulosin  0.4 mg Oral QHS  .  traZODone  50 mg Oral QHS  . warfarin  5 mg Oral Once per day on Sun Tue Thu Sat  . [START ON 08/07/2015] warfarin  7.5 mg Oral Once per day on Mon Wed Fri  . Warfarin - Pharmacist Dosing Inpatient   Does not apply q1800    Assessment: 81 yo male admitted for possible GIB; INR 1.96 on admission; warfarin held d/t possible bleed.  EGD confirmed no active bleeding. INR on admission 1.96, coumadin held since admission on 9/5. Home warfarin to be restarted 9/9  9/7 - INR 1.87, no coumadin given 9/8 - given coumadin 7.5mg  9/9 - INR 1.54  Goal of Therapy:  INR 2-3 Monitor platelets by anticoagulation protocol: Yes   Plan:  Restarted home dose of warfarin 7.5mg  daily/5mg  MWF per last anticoag note on 8/31.   Discussed coumadin dosing with MD, would like to give 10mg  x1 today. Needs INR > 2 to discharge. Will give 10mg  x 1 today then resume home dosing as above. Will recheck INR with AM labs.   9/10 AM INR 1.50. Will give warfarin 10 mg PO x 1 then will start home dose thereafter.   9/11 INR 1.67.  Will order additional 5 mg x1 for total dose of 10 mg today and then start back with home regimen. Recheck in AM  Demetrius Charity, PharmD 08/06/2015

## 2015-08-07 LAB — PROTIME-INR
INR: 1.98
Prothrombin Time: 22.7 seconds — ABNORMAL HIGH (ref 11.4–15.0)

## 2015-08-07 LAB — CBC
HCT: 25.2 % — ABNORMAL LOW (ref 40.0–52.0)
Hemoglobin: 7.8 g/dL — ABNORMAL LOW (ref 13.0–18.0)
MCH: 21.4 pg — ABNORMAL LOW (ref 26.0–34.0)
MCHC: 30.8 g/dL — ABNORMAL LOW (ref 32.0–36.0)
MCV: 69.2 fL — ABNORMAL LOW (ref 80.0–100.0)
PLATELETS: 287 10*3/uL (ref 150–440)
RBC: 3.64 MIL/uL — AB (ref 4.40–5.90)
RDW: 21.4 % — AB (ref 11.5–14.5)
WBC: 4.7 10*3/uL (ref 3.8–10.6)

## 2015-08-07 MED ORDER — SENNOSIDES-DOCUSATE SODIUM 8.6-50 MG PO TABS: 1.0000 | ORAL_TABLET | Freq: Every evening | ORAL | Status: DC | PRN

## 2015-08-07 MED ORDER — HYDROCODONE-ACETAMINOPHEN 5-325 MG PO TABS: 1.0000 | ORAL_TABLET | Freq: Four times a day (QID) | ORAL | Status: DC | PRN

## 2015-08-07 MED ORDER — PANTOPRAZOLE SODIUM 40 MG PO TBEC: 40.0000 mg | DELAYED_RELEASE_TABLET | Freq: Every day | ORAL | Status: DC

## 2015-08-07 NOTE — Care Management Important Message (Signed)
Important Message  Patient Details  Name: [name] MRN: [id number] Date of Birth: 04-Mar-1931   Medicare Important Message Given:  Yes-third notification given    Eber Hong, RN 08/07/2015, 9:27 AM

## 2015-08-07 NOTE — Progress Notes (Signed)
Pt is a&o, VSS with no complaints of pain. Orders to discharge pt. Discharge instructions and prescription given to pt with verbal acknowledgment of understanding. Pt escorted off unit via wheelchair by nursing.

## 2015-08-07 NOTE — Progress Notes (Signed)
ANTICOAGULATION CONSULT NOTE - FOLLOW UP   Pharmacy Consult for Warfarin Indication: Mechanical Aortic Valve  Allergies  Allergen Reactions  . Levofloxacin Nausea Only  . Sulfa Antibiotics Other (See Comments)    Reaction:  Unknown     Patient Measurements: Height:  (180.3 cm) Weight: 200 lb (90.719 kg) IBW/kg (Calculated) : 75.3   Vital Signs: Temp: 97.6 F (36.4 C) (09/12 0404) Temp Source: Oral (09/12 0404) BP: 152/64 mmHg (09/12 0404) Pulse Rate: 64 (09/12 0404)  Labs:  Recent Labs  08/05/15 1154 08/06/15 0408 08/07/15 0454  HGB  --  8.1* 7.8*  HCT  --  26.8* 25.2*  PLT  --  301 287  LABPROT 17.9* 19.9* 22.7*  INR 1.46 1.67 1.98    Estimated Creatinine Clearance: 83.5 mL/min (by C-G formula based on Cr of 0.78).   Medical History: Past Medical History  Diagnosis Date  . GERD (gastroesophageal reflux disease)   . Bradycardia     chronic, no symptoms 07/2010  . Depression   . Anxiety 10/11  . SOB (shortness of breath) 10/11    08/2010,Episodes at 5 AM, eventually felt to be anxiety, after complete workup including catheterization, pt greatly improved with anxiety meds 11/11  . Hypertension     BP higher than usual 04/19/10; amlodipine increased by telephone  . Decreased hearing     Right ear  . Coronary artery disease     mild, cath, 08/2010  . Aortic stenosis     a. s/p mechcanical AVR, 2002  . S/P AVR     a. St. Jude. mechanical 2002; b. echo 08/2010 EF 60%, trival AI, mild MR, AVR working well; c. on longterm warfarin tx  . Carotid bruit     dopplers in past, no abnormalities  . Gastric ulcer   . C. difficile colitis     Medications:  Scheduled:  . amLODipine  10 mg Oral Daily  . citalopram  20 mg Oral Daily  . finasteride  5 mg Oral Daily  . irbesartan  37.5 mg Oral Daily  . multivitamin with minerals  1 tablet Oral Daily  . pantoprazole  40 mg Oral Daily  . tamsulosin  0.4 mg Oral QHS  . traZODone  50 mg Oral QHS  . [START ON  08/08/2015] warfarin  5 mg Oral Once per day on Sun Tue Thu Sat  . warfarin  7.5 mg Oral Once per day on Mon Wed Fri  . Warfarin - Pharmacist Dosing Inpatient   Does not apply q1800    Assessment: 81 yo male admitted for possible GIB; INR 1.96 on admission; warfarin held d/t possible bleed.  EGD confirmed no active bleeding. INR on admission 1.96, coumadin held since admission on 9/5. Home warfarin to be restarted 9/9  9/7 - INR 1.87, no coumadin given 9/8 - given coumadin 7.5mg  9/9 - INR 1.54  Goal of Therapy:  INR 2-3 Monitor platelets by anticoagulation protocol: Yes   Plan:  Restarted home dose of warfarin 7.5mg  daily/5mg  MWF per last anticoag note on 8/31.   Discussed coumadin dosing with MD, would like to give  x1 today. Needs INR > 2 to discharge. Will give  x 1 today then resume home dosing as above. Will recheck INR with AM labs.   9/10 AM INR 1.50. Will give warfarin 10 mg PO x 1 then will start home dose thereafter.   9/11 INR 1.67. Will order additional 5 mg x1 for total dose of 10 mg today  and then start back with home regimen. Recheck in AM  9/12 AM INR 1.98. Continue current regimen and recheck in AM.   Fulton Reek, PharmD, BCPS  08/07/2015

## 2015-08-07 NOTE — Discharge Instructions (Signed)
Follow with Cardiology clinic in 1 week to check your INR.

## 2015-08-07 NOTE — Discharge Summary (Signed)
[facility] [place] at [facility]   PATIENT NAME: [name]    MR#:  [id number]  DATE OF BIRTH:  01-08-1931  DATE OF ADMISSION:  07/31/2015 ADMITTING PHYSICIAN: Adrian Saran, MD  DATE OF DISCHARGE: 08/07/2015  PRIMARY CARE PHYSICIAN: [name], MD    ADMISSION DIAGNOSIS:  Gastrointestinal hemorrhage associated with anorectal source [K62.5] Symptomatic anemia [D64.9]  DISCHARGE DIAGNOSIS:  Active Problems:   GIB (gastrointestinal bleeding)   SECONDARY DIAGNOSIS:   Past Medical History  Diagnosis Date  . GERD (gastroesophageal reflux disease)   . Bradycardia     chronic, no symptoms 07/2010  . Depression   . Anxiety 10/11  . SOB (shortness of breath) 10/11    08/2010,Episodes at 5 AM, eventually felt to be anxiety, after complete workup including catheterization, pt greatly improved with anxiety meds 11/11  . Hypertension     BP higher than usual 04/19/10; amlodipine increased by telephone  . Decreased hearing     Right ear  . Coronary artery disease     mild, cath, 08/2010  . Aortic stenosis     a. s/p mechcanical AVR, 2002  . S/P AVR     a. St. Jude. mechanical 2002; b. echo 08/2010 EF 60%, trival AI, mild MR, AVR working well; c. on longterm warfarin tx  . Carotid bruit     dopplers in past, no abnormalities  . Gastric ulcer   . C. difficile colitis     HOSPITAL COURSE:   This 81 year old male with aortic valve replacement on Coumadin and history of gastric ulcer who presents again with GI bleed and symptomatic anemia.    1. Symptomatic anemia: Patient has received 2 units on admission. Continue to monitor hemoglobin. Remained stable afterwards.  2. GI bleed: he underwent EGD which showed gastritis but no ulcer. Patient will likely need capsule endoscopy to investigate further etiology of bleeding this should be ordered by outpatient GI physician. Patient is receiving Coumadin however not Lovenox due to his GI bleed. he  will need daily PPI since he will be on Coumadin lifelong.  3. Aortic valve placement: INR is 1.98 today. Patient is not receiving Lovenox due to GI bleed. Continue to monitor INR level.   discharge home today- he has appointment with coumadin clinic in next 2 days.  4. Essential hypertension: Patient will continue outpatient medications including Norvasc and valsartan.  5. Depression/anxiety: Continue Xanax and citalopram.  6. BPH: Continue Flomax and Proscar.  7. Restless leg syndrome: Continue increased dose of Requip and add Valium when necessary 8. Hyponatremia: Improved  9. Constipation: Resolved.    DISCHARGE CONDITIONS:  Stable.  CONSULTS OBTAINED:     GI   Cardio- with Dr. Mariah Milling.   DRUG ALLERGIES:   Allergies  Allergen Reactions  . Levofloxacin Nausea Only  . Sulfa Antibiotics Other (See Comments)    Reaction:  Unknown     DISCHARGE MEDICATIONS:   Current Discharge Medication List    START taking these medications   Details  HYDROcodone-acetaminophen (NORCO/VICODIN) 5-325 MG per tablet Take 1 tablet by mouth every 6 (six) hours as needed for moderate pain. Qty: 30 tablet, Refills: 0    pantoprazole (PROTONIX) 40 MG tablet Take 1 tablet (40 mg total) by mouth daily. Qty: 30 tablet, Refills: 2    senna-docusate (SENOKOT-S) 8.6-50 MG per tablet Take 1 tablet by mouth at bedtime as needed for mild constipation. Qty: 30 tablet, Refills: 0      CONTINUE these  medications which have NOT CHANGED   Details  acetaminophen (TYLENOL) 500 MG tablet Take 500 mg by mouth every 4 (four) hours as needed for mild pain or headache.     ALPRAZolam (XANAX) 0.5 MG tablet Take 0.5 tablets (0.25 mg total) by mouth daily as needed for anxiety. Qty: 10 tablet, Refills: 0    amLODipine (NORVASC) 10 MG tablet Take 1 tablet by mouth  daily Qty: 90 tablet, Refills: 2    aspirin EC 81 MG tablet Take 81 mg by mouth daily.    citalopram (CELEXA) 20 MG tablet Take 20 mg by  mouth daily.    finasteride (PROSCAR) 5 MG tablet Take 5 mg by mouth daily.      Multiple Vitamin (MULTIVITAMIN WITH MINERALS) TABS tablet Take 1 tablet by mouth daily.    rOPINIRole (REQUIP) 1 MG tablet Take 1 tablet (1 mg total) by mouth 3 (three) times daily. Qty: 10 tablet, Refills: 0    Tamsulosin HCl (FLOMAX) 0.4 MG CAPS Take 0.4 mg by mouth at bedtime.     traZODone (DESYREL) 50 MG tablet Take 50 mg by mouth at bedtime.    valsartan (DIOVAN) 320 MG tablet Take 320 mg by mouth daily.      warfarin (COUMADIN) 5 MG tablet Take 5-7.5 mg by mouth at bedtime. Pt takes 7.5mg  on Monday, Wednesday, and Friday.   Pt takes  all other days.      STOP taking these medications     celecoxib (CELEBREX) 200 MG capsule          DISCHARGE INSTRUCTIONS:    Follow with GI, Cardio and Coumadin clinic in next 1 week.  If you experience worsening of your admission symptoms, develop shortness of breath, life threatening emergency, suicidal or homicidal thoughts you must seek medical attention immediately by calling 911 or calling your MD immediately  if symptoms less severe.  You Must read complete instructions/literature along with all the possible adverse reactions/side effects for all the Medicines you take and that have been prescribed to you. Take any new Medicines after you have completely understood and accept all the possible adverse reactions/side effects.   Please note  You were cared for by a hospitalist during your hospital stay. If you have any questions about your discharge medications or the care you received while you were in the hospital after you are discharged, you can call the unit and asked to speak with the hospitalist on call if the hospitalist that took care of you is not available. Once you are discharged, your primary care physician will handle any further medical issues. Please note that NO REFILLS for any discharge medications will be authorized once you are  discharged, as it is imperative that you return to your primary care physician (or establish a relationship with a primary care physician if you do not have one) for your aftercare needs so that they can reassess your need for medications and monitor your lab values.    Today   CHIEF COMPLAINT:   Chief Complaint  Patient presents with  . Shortness of Breath    HISTORY OF PRESENT ILLNESS:  [name]  is a 81 y.o. male with a known history of gastric ulcer, aortic valve replacement on Coumadin and essential hypertension who presents with above complaint. Patient reports over the past few days he's had increasing shortness of breath, dyspnea exertion and noticed melanoma 3 days ago. In the emergency room he was noted to have a low hemoglobin. ER  physician ordered 2 units of PRBCs. Patient was admitted in June with GI bleed secondary to gastric ulcer with visible vessel.  VITAL SIGNS:  Blood pressure 137/69, pulse 92, temperature 97.6 F (36.4 C), temperature source Oral, resp. rate 18, height 5\' 11"  (1.803 m), weight 90.719 kg (200 lb), SpO2 100 %.  I/O:   Intake/Output Summary (Last 24 hours) at 08/07/15 1134 Last data filed at 08/07/15 1015  Gross per 24 hour  Intake    360 ml  Output   1175 ml  Net   -815 ml    PHYSICAL EXAMINATION:   Constitutional: He is oriented to person, place, and time and well-developed, well-nourished, and in no distress. No distress.  HENT:  Head: Normocephalic.  Eyes: No scleral icterus.  Neck: Normal range of motion. Neck supple. No JVD present. No tracheal deviation present.  Cardiovascular: Normal rate, regular rhythm and normal heart sounds. Exam reveals no gallop and no friction rub.  No murmur heard. Aortic valve click  Pulmonary/Chest: Effort normal and breath sounds normal. No respiratory distress. He has no wheezes. He has no rales. He exhibits no tenderness.  Abdominal: Soft. Bowel sounds are normal. He exhibits no distension and  no mass. There is no tenderness. There is no rebound and no guarding.  Musculoskeletal: Normal range of motion. He exhibits no edema.  Neurological: He is alert and oriented to person, place, and time.  Skin: Skin is warm. No rash noted. No erythema.  Psychiatric: Affect and judgment normal.   DATA REVIEW:   CBC  Recent Labs Lab 08/07/15 0454  WBC 4.7  HGB 7.8*  HCT 25.2*  PLT 287    Chemistries   Recent Labs Lab 08/03/15 0420  NA 132*  K 3.7  CL 101  CO2 26  GLUCOSE 94  BUN 10  CREATININE 0.78  CALCIUM 8.2*    Cardiac Enzymes No results for input(s): TROPONINI in the last 168 hours.  Microbiology Results  Results for orders placed or performed during the hospital encounter of 04/16/15  C difficile quick scan w PCR reflex [facility])     Status: None   Collection Time: 04/17/15 12:20 AM  Result Value Ref Range Status   C Diff antigen NEGATIVE  Final   C Diff toxin NEGATIVE  Final   C Diff interpretation Negative for C. difficile  Final    RADIOLOGY:  No results found.   Management plans discussed with the patient, family and they are in agreement.  CODE STATUS:     Code Status Orders        Start     Ordered   07/31/15 1243  Full code   Continuous     07/31/15 1242      TOTAL TIME TAKING CARE OF THIS PATIENT:  40 minutes.    Altamese Dilling M.D on 08/07/2015 at 11:34 AM  Between 7am to 6pm - Pager - [phone]  After 6pm go to [email or web address] - password [email or web address] [facility]  [place] [facility]  Office  [phone]  CC: Primary care physician; [name], MD

## 2015-08-07 NOTE — Care Management (Signed)
Patient's INR today is 1.98.  He is anxious for discharge.  Says that he has a follow up appointment with cardiology tomorrow and an appointment "at the coumadin clinic" on Wednesday.   He feels that his INR is so close that there is nothing that another day in the hospital could do to help him.  He is ambulatory and there are no discharge needs.

## 2015-08-08 ENCOUNTER — Ambulatory Visit (INDEPENDENT_AMBULATORY_CARE_PROVIDER_SITE_OTHER): Payer: Medicare Other

## 2015-08-08 ENCOUNTER — Encounter: Payer: Self-pay | Admitting: Cardiovascular Disease

## 2015-08-08 ENCOUNTER — Ambulatory Visit (INDEPENDENT_AMBULATORY_CARE_PROVIDER_SITE_OTHER): Payer: Medicare Other | Admitting: Cardiovascular Disease

## 2015-08-08 VITALS — BP 138/64 | HR 78 | Ht 71.0 in | Wt 201.0 lb

## 2015-08-08 DIAGNOSIS — I359 Nonrheumatic aortic valve disorder, unspecified: Secondary | ICD-10-CM | POA: Diagnosis not present

## 2015-08-08 DIAGNOSIS — I350 Nonrheumatic aortic (valve) stenosis: Secondary | ICD-10-CM | POA: Diagnosis not present

## 2015-08-08 DIAGNOSIS — I2510 Atherosclerotic heart disease of native coronary artery without angina pectoris: Secondary | ICD-10-CM | POA: Diagnosis not present

## 2015-08-08 DIAGNOSIS — I10 Essential (primary) hypertension: Secondary | ICD-10-CM | POA: Diagnosis not present

## 2015-08-08 DIAGNOSIS — Z952 Presence of prosthetic heart valve: Secondary | ICD-10-CM

## 2015-08-08 DIAGNOSIS — Z7901 Long term (current) use of anticoagulants: Secondary | ICD-10-CM

## 2015-08-08 DIAGNOSIS — Z954 Presence of other heart-valve replacement: Secondary | ICD-10-CM

## 2015-08-08 DIAGNOSIS — R0602 Shortness of breath: Secondary | ICD-10-CM | POA: Diagnosis not present

## 2015-08-08 LAB — POCT INR: INR: 2.3

## 2015-08-08 MED ORDER — FERROUS SULFATE 325 (65 FE) MG PO TABS: 325.0000 mg | ORAL_TABLET | Freq: Two times a day (BID) | ORAL | Status: DC

## 2015-08-08 NOTE — Assessment & Plan Note (Signed)
I agree with resuming anticoagulation with warfarin. INR was checked today. Aspirin was discontinued. He is still very anemic with significantly low MCV. Thus, I added ferrous sulfate which is likely to be needed for few months. I requested repeat CBC in one week. He is to follow-up with GI.

## 2015-08-08 NOTE — Patient Instructions (Signed)
Medication Instructions:  Your physician has recommended you make the following change in your medication:  START taking ferrous sulfate  twice per day   Labwork: Your physician recommends that you return for lab work in: one week: CBC   Testing/Procedures: none  Follow-Up: Your physician recommends that you schedule a follow-up appointment in: one month with Dr. Mariah Milling   Any Other Special Instructions Will Be Listed Below (If Applicable).

## 2015-08-08 NOTE — Assessment & Plan Note (Signed)
He had mild coronary artery disease in the past currently has no symptoms of angina.

## 2015-08-08 NOTE — Progress Notes (Signed)
HPI  [name] is a 81 y.o. male h/o aortic valvular disease, mechanical valve placed in the aortic position in 2002, good LV function and good function of his valve, mild CAD by cath 2011, h/o Go bleed at the end of 2015, C. difficile, MRSA bacteremia who is here today for a follow-up visit after recent hospitalization for GI bleed. He received 2 units of packed RBCs. He underwent EGD which showed gastritis but no ulcer. Capsule endoscopy is being considered. Warfarin was resumed without aspirin. He has been doing reasonably well from a cardiac standpoint with no chest pain or significant dyspnea. He reports no further bleeding. He used to follow-up with Dr. Myrtis Ser in the past and the plan was to establish with Dr. Mariah Milling.   He continues to have tremendous difficulty with his restless leg syndrome. Takes requip 1 mg up to 5 times per day Scheduled to see neurology middle of October  Allergies  Allergen Reactions  . Levofloxacin Nausea Only  . Sulfa Antibiotics Other (See Comments)    Reaction:  Unknown      Current Outpatient Prescriptions on File Prior to Visit  Medication Sig Dispense Refill  . acetaminophen (TYLENOL) 500 MG tablet Take 500 mg by mouth every 4 (four) hours as needed for mild pain or headache.     . ALPRAZolam (XANAX) 0.5 MG tablet Take 0.5 tablets (0.25 mg total) by mouth daily as needed for anxiety. (Patient taking differently: Take 0.5 mg by mouth daily as needed for anxiety. ) 10 tablet 0  . amLODipine (NORVASC) 10 MG tablet Take 1 tablet by mouth  daily (Patient taking differently: Take 10 mg by mouth daily. ) 90 tablet 2  . citalopram (CELEXA) 20 MG tablet Take 20 mg by mouth daily.    . finasteride (PROSCAR) 5 MG tablet Take 5 mg by mouth daily.      Marland Kitchen HYDROcodone-acetaminophen (NORCO/VICODIN) 5-325 MG per tablet Take 1 tablet by mouth every 6 (six) hours as needed for moderate pain. 30 tablet 0  . Multiple Vitamin (MULTIVITAMIN WITH MINERALS) TABS  tablet Take 1 tablet by mouth daily.    . pantoprazole (PROTONIX) 40 MG tablet Take 1 tablet (40 mg total) by mouth daily. 30 tablet 2  . rOPINIRole (REQUIP) 1 MG tablet Take 1 tablet (1 mg total) by mouth 3 (three) times daily. (Patient taking differently: Take 2 mg by mouth 5 (five) times daily. ) 10 tablet 0  . senna-docusate (SENOKOT-S) 8.6-50 MG per tablet Take 1 tablet by mouth at bedtime as needed for mild constipation. 30 tablet 0  . Tamsulosin HCl (FLOMAX) 0.4 MG CAPS Take 0.4 mg by mouth at bedtime.     . traZODone (DESYREL) 50 MG tablet Take 50 mg by mouth at bedtime.    . valsartan (DIOVAN) 320 MG tablet Take 320 mg by mouth daily.      Marland Kitchen warfarin (COUMADIN) 5 MG tablet Take 5-7.5 mg by mouth at bedtime. Pt takes 7.5mg  on Monday, Wednesday, and Friday.   Pt takes  all other days.     No current facility-administered medications on file prior to visit.     Past Medical History  Diagnosis Date  . GERD (gastroesophageal reflux disease)   . Bradycardia     chronic, no symptoms 07/2010  . Depression   . Anxiety 10/11  . SOB (shortness of breath) 10/11    08/2010,Episodes at 5 AM, eventually felt to be anxiety, after complete workup including catheterization, pt greatly  improved with anxiety meds 11/11  . Hypertension     BP higher than usual 04/19/10; amlodipine increased by telephone  . Decreased hearing     Right ear  . Coronary artery disease     mild, cath, 08/2010  . Aortic stenosis     a. s/p mechcanical AVR, 2002  . S/P AVR     a. St. Jude. mechanical 2002; b. echo 08/2010 EF 60%, trival AI, mild MR, AVR working well; c. on longterm warfarin tx  . Carotid bruit     dopplers in past, no abnormalities  . Gastric ulcer   . C. difficile colitis      Past Surgical History  Procedure Laterality Date  . Valve replacement  1/02    Aortic; echo 3/09 valve working well; echo 10/11 working well; put on Coumadin  . Cardiac catheterization    . Joint replacement    .  Hernia repair    . Esophagogastroduodenoscopy N/A 04/05/2015    Procedure: ESOPHAGOGASTRODUODENOSCOPY (EGD);  Surgeon: Scot Jun, MD;  Location: [facility] ENDOSCOPY;  Service: Endoscopy;  Laterality: N/A;  . Esophagogastroduodenoscopy N/A 04/17/2015    Procedure: ESOPHAGOGASTRODUODENOSCOPY (EGD);  Surgeon: Scot Jun, MD;  Location: [facility] ENDOSCOPY;  Service: Endoscopy;  Laterality: N/A;  . Esophagogastroduodenoscopy N/A 08/02/2015    Procedure: ESOPHAGOGASTRODUODENOSCOPY (EGD);  Surgeon: Wallace Cullens, MD;  Location: [facility] ENDOSCOPY;  Service: Endoscopy;  Laterality: N/A;     Family History  Problem Relation Age of Onset  . Hypertension    . Hypertension Mother   . Diabetes type II Mother   . Heart disease Mother   . Hypertension Father   . Heart disease Father      Social History   Social History  . Marital Status: Married    Spouse Name: N/A  . Number of Children: N/A  . Years of Education: N/A   Occupational History  . Retired    Social History Main Topics  . Smoking status: Former Games developer  . Smokeless tobacco: Not on file  . Alcohol Use: 1.2 oz/week    2 Glasses of wine, 0 Standard drinks or equivalent per week     Comment: Social  . Drug Use: No  . Sexual Activity: Not on file   Other Topics Concern  . Not on file   Social History Narrative   No regular exercise.      PHYSICAL EXAM   BP 138/64 mmHg  Pulse 78  Ht 5\' 11"  (1.803 m)  Wt 201 lb (91.173 kg)  BMI 28.05 kg/m2 Constitutional: He is oriented to person, place, and time. He appears well-developed and well-nourished. No distress.  HENT: No nasal discharge.  Head: Normocephalic and atraumatic.  Eyes: Pupils are equal and round.  No discharge. Neck: Normal range of motion. Neck supple. No JVD present. No thyromegaly present.  Cardiovascular: Normal rate, regular rhythm, normal heart sounds. Exam reveals no gallop and no friction rub. No murmur heard.  Pulmonary/Chest: Effort normal and breath  sounds normal. No stridor. No respiratory distress. He has no wheezes. He has no rales. He exhibits no tenderness.  Abdominal: Soft. Bowel sounds are normal. He exhibits no distension. There is no tenderness. There is no rebound and no guarding.  Musculoskeletal: Normal range of motion. He exhibits no edema and no tenderness.  Neurological: He is alert and oriented to person, place, and time. Coordination normal.  Skin: Skin is warm and dry. No rash noted. He is not diaphoretic. No erythema. No pallor.  Psychiatric: He has a normal mood and affect. His behavior is normal. Judgment and thought content normal.       EKG: Sinus  Rhythm  - occasional PAC    # PACs = 1. WITHIN NORMAL LIMITS   ASSESSMENT AND PLAN

## 2015-08-08 NOTE — Assessment & Plan Note (Signed)
Blood pressure is controlled on current medications. 

## 2015-08-09 ENCOUNTER — Telehealth: Payer: Self-pay | Admitting: *Deleted

## 2015-08-09 NOTE — Telephone Encounter (Signed)
Pt calling stating he found the paper work that showed his coumadin levels and stated that Nurse Thurnell Lose was right and he was wrong He would like to know since that is the case the dosage he is on now, will that be okay or not?  Please advise

## 2015-08-09 NOTE — Telephone Encounter (Signed)
[name] in [place] for coumadin clinic today. Discussed with her. She states she will call patient to clarify.

## 2015-08-16 ENCOUNTER — Other Ambulatory Visit (INDEPENDENT_AMBULATORY_CARE_PROVIDER_SITE_OTHER): Payer: Medicare Other

## 2015-08-16 ENCOUNTER — Ambulatory Visit (INDEPENDENT_AMBULATORY_CARE_PROVIDER_SITE_OTHER): Payer: Medicare Other

## 2015-08-16 DIAGNOSIS — Z7901 Long term (current) use of anticoagulants: Secondary | ICD-10-CM | POA: Diagnosis not present

## 2015-08-16 DIAGNOSIS — I359 Nonrheumatic aortic valve disorder, unspecified: Secondary | ICD-10-CM

## 2015-08-16 DIAGNOSIS — I350 Nonrheumatic aortic (valve) stenosis: Secondary | ICD-10-CM

## 2015-08-16 DIAGNOSIS — Z954 Presence of other heart-valve replacement: Secondary | ICD-10-CM

## 2015-08-16 DIAGNOSIS — R0602 Shortness of breath: Secondary | ICD-10-CM

## 2015-08-16 DIAGNOSIS — Z952 Presence of prosthetic heart valve: Secondary | ICD-10-CM

## 2015-08-16 LAB — POCT INR: INR: 1.4

## 2015-08-17 ENCOUNTER — Other Ambulatory Visit: Payer: Self-pay | Admitting: Cardiology

## 2015-08-17 LAB — CBC
Hematocrit: 29.8 % — ABNORMAL LOW (ref 37.5–51.0)
Hemoglobin: 9.1 g/dL — ABNORMAL LOW (ref 12.6–17.7)
MCH: 21.8 pg — AB (ref 26.6–33.0)
MCHC: 30.5 g/dL — AB (ref 31.5–35.7)
MCV: 72 fL — ABNORMAL LOW (ref 79–97)
PLATELETS: 432 10*3/uL — AB (ref 150–379)
RBC: 4.17 x10E6/uL (ref 4.14–5.80)
RDW: 22 % — AB (ref 12.3–15.4)
WBC: 5.3 10*3/uL (ref 3.4–10.8)

## 2015-08-23 ENCOUNTER — Ambulatory Visit (INDEPENDENT_AMBULATORY_CARE_PROVIDER_SITE_OTHER): Payer: Medicare Other | Admitting: Neurology

## 2015-08-23 ENCOUNTER — Encounter: Payer: Self-pay | Admitting: Neurology

## 2015-08-23 ENCOUNTER — Ambulatory Visit (INDEPENDENT_AMBULATORY_CARE_PROVIDER_SITE_OTHER): Payer: Medicare Other

## 2015-08-23 VITALS — BP 162/66 | HR 76 | Ht 71.0 in | Wt 198.0 lb

## 2015-08-23 DIAGNOSIS — Z954 Presence of other heart-valve replacement: Secondary | ICD-10-CM

## 2015-08-23 DIAGNOSIS — I350 Nonrheumatic aortic (valve) stenosis: Secondary | ICD-10-CM

## 2015-08-23 DIAGNOSIS — G2581 Restless legs syndrome: Secondary | ICD-10-CM | POA: Diagnosis not present

## 2015-08-23 DIAGNOSIS — Z7901 Long term (current) use of anticoagulants: Secondary | ICD-10-CM | POA: Diagnosis not present

## 2015-08-23 DIAGNOSIS — Z952 Presence of prosthetic heart valve: Secondary | ICD-10-CM

## 2015-08-23 DIAGNOSIS — I359 Nonrheumatic aortic valve disorder, unspecified: Secondary | ICD-10-CM | POA: Diagnosis not present

## 2015-08-23 HISTORY — DX: Restless legs syndrome: G25.81

## 2015-08-23 LAB — POCT INR: INR: 1.7

## 2015-08-23 MED ORDER — ROPINIROLE HCL ER 8 MG PO TB24: 8.0000 mg | ORAL_TABLET | Freq: Every day | ORAL | Status: DC

## 2015-08-23 NOTE — Patient Instructions (Addendum)
  We will substitute the short acting Requip for the long acting medication, taking 8 mg at night.   Restless Legs Syndrome Restless legs syndrome is a movement disorder. It may also be called a sensorimotor disorder.  CAUSES  No one knows what specifically causes restless legs syndrome, but it tends to run in families. It is also more common in people with low iron, in pregnancy, in people who need dialysis, and those with nerve damage (neuropathy).Some medications may make restless legs syndrome worse.Those medications include drugs to treat high blood pressure, some heart conditions, nausea, colds, allergies, and depression. SYMPTOMS Symptoms include uncomfortable sensations in the legs. These leg sensations are worse during periods of inactivity or rest. They are also worse while sitting or lying down. Individuals that have the disorder describe sensations in the legs that feel like:  Pulling.  Drawing.  Crawling.  Worming.  Boring.  Tingling.  Pins and needles.  Prickling.  Pain. The sensations are usually accompanied by an overwhelming urge to move the legs. Sudden muscle jerks may also occur. Movement provides temporary relief from the discomfort. In rare cases, the arms may also be affected. Symptoms may interfere with going to sleep (sleep onset insomnia). Restless legs syndrome may also be related to periodic limb movement disorder (PLMD). PLMD is another more common motor disorder. It also causes interrupted sleep. The symptoms from PLMD usually occur most often when you are awake. TREATMENT  Treatment for restless legs syndrome is symptomatic. This means that the symptoms are treated.   Massage and cold compresses may provide temporary relief.  Walk, stretch, or take a cold or hot bath.  Get regular exercise and a good night's sleep.  Avoid caffeine, alcohol, nicotine, and medications that can make it worse.  Do activities that provide mental stimulation like  discussions, needlework, and video games. These may be helpful if you are not able to walk or stretch. Some medications are effective in relieving the symptoms. However, many of these medications have side effects. Ask your caregiver about medications that may help your symptoms. Correcting iron deficiency may improve symptoms for some patients. Document Released: 11/01/2002 Document Revised: 03/28/2014 Document Reviewed: 02/07/2011 [facility] Patient Information 2015 [place], [place]. This information is not intended to replace advice given to you by your health care provider. Make sure you discuss any questions you have with your health care provider.

## 2015-08-23 NOTE — Progress Notes (Signed)
Reason for visit: Restless leg syndrome  Referring physician: Dr. Weyman Pedro [name] is a 81 y.o. male  History of present illness:  [name] is an 81 year old right-handed white male with a history of restless leg syndrome that has been present 2 or 3 years. The patient has been on Requip, currently taking 2 mg 3 times daily. He indicates that the medication does help, but he will take the medication before bedtime, and he generally will wake up at 2 or 3 in the morning with the restless legs, take Requip again, and it takes about an hour for the medication to start working before he can get back to sleep. The patient will take a third dose around 10 AM. He occasionally will have to get up and walk about the house to get rid of the restless legs symptoms. Of note is that he has had a chronic issue with an iron deficiency anemia. He has had a GI source of blood loss, a workup has shown some evidence of gastritis or gastric ulcer. He is on Coumadin therapy as he has a mechanical heart valve in place. The patient denies any family history of restless leg syndrome. He denies any numbness sensations or burning or stinging sensations in the feet or legs. He denies any significant back or neck discomfort. He denies any issues controlling the bowels or the bladder. He does have some mild gait instability, he reports no recent falls, but he will fall on occasion. He is sent to this office for an evaluation.  Past Medical History  Diagnosis Date  . GERD (gastroesophageal reflux disease)   . Bradycardia     chronic, no symptoms 07/2010  . Depression   . Anxiety 10/11  . SOB (shortness of breath) 10/11    08/2010,Episodes at 5 AM, eventually felt to be anxiety, after complete workup including catheterization, pt greatly improved with anxiety meds 11/11  . Hypertension     BP higher than usual 04/19/10; amlodipine increased by telephone  . Decreased hearing     Right ear  . Coronary artery  disease     mild, cath, 08/2010  . Aortic stenosis     a. s/p mechcanical AVR, 2002  . S/P AVR     a. St. Jude. mechanical 2002; b. echo 08/2010 EF 60%, trival AI, mild MR, AVR working well; c. on longterm warfarin tx  . Carotid bruit     dopplers in past, no abnormalities  . Gastric ulcer   . C. difficile colitis   . RLS (restless legs syndrome) 08/23/2015    Past Surgical History  Procedure Laterality Date  . Valve replacement  1/02    Aortic; echo 3/09 valve working well; echo 10/11 working well; put on Coumadin  . Cardiac catheterization    . Joint replacement    . Hernia repair    . Esophagogastroduodenoscopy N/A 04/05/2015    Procedure: ESOPHAGOGASTRODUODENOSCOPY (EGD);  Surgeon: Scot Jun, MD;  Location: [facility] ENDOSCOPY;  Service: Endoscopy;  Laterality: N/A;  . Esophagogastroduodenoscopy N/A 04/17/2015    Procedure: ESOPHAGOGASTRODUODENOSCOPY (EGD);  Surgeon: Scot Jun, MD;  Location[facility] ENDOSCOPY;  Service: Endoscopy;  Laterality: N/A;  . Esophagogastroduodenoscopy N/A 08/02/2015    Procedure: ESOPHAGOGASTRODUODENOSCOPY (EGD);  Surgeon: Wallace Cullens, MD;  Location: [facility] ENDOSCOPY;  Service: Endoscopy;  Laterality: N/A;  . Total hip arthroplasty      Family History  Problem Relation Age of Onset  . Hypertension    .  Hypertension Mother   . Diabetes type II Mother   . Heart disease Mother   . Heart attack Mother   . Hypertension Father   . Heart disease Father   . Stroke Father   . Stroke Brother   . Stroke Brother     Social history:  reports that he has quit smoking. He has never used smokeless tobacco. He reports that he drinks about 1.8 oz of alcohol per week. He reports that he does not use illicit drugs.  Medications:  Prior to Admission medications   Medication Sig Start Date End Date Taking? Authorizing Provider  acetaminophen (TYLENOL) 500 MG tablet Take 500 mg by mouth every 4 (four) hours as needed for mild pain or headache.    Yes Historical  Provider, MD  ALPRAZolam Prudy Feeler) 0.5 MG tablet Take 0.5 tablets (0.25 mg total) by mouth daily as needed for anxiety. Patient taking differently: Take 0.5 mg by mouth daily as needed for anxiety.  04/20/15  Yes Auburn Bilberry, MD  amLODipine (NORVASC) 10 MG tablet Take 1 tablet by mouth  daily Patient taking differently: Take 10 mg by mouth daily.  06/20/15  Yes Luis Abed, MD  citalopram (CELEXA) 20 MG tablet Take 20 mg by mouth daily.   Yes Historical Provider, MD  ferrous sulfate 325 (65 FE) MG tablet Take 1 tablet (325 mg total) by mouth 2 (two) times daily with a meal. 08/08/15  Yes Iran Ouch, MD  finasteride (PROSCAR) 5 MG tablet Take 5 mg by mouth daily.     Yes Historical Provider, MD  Multiple Vitamin (MULTIVITAMIN WITH MINERALS) TABS tablet Take 1 tablet by mouth daily.   Yes Historical Provider, MD  pantoprazole (PROTONIX) 40 MG tablet Take 1 tablet (40 mg total) by mouth daily. 08/07/15  Yes Altamese Dilling, MD  rOPINIRole (REQUIP) 1 MG tablet Take 1 tablet (1 mg total) by mouth 3 (three) times daily. Patient taking differently: Take 2 mg by mouth 3 (three) times daily.  04/13/15  Yes Vipul Sherryll Burger, MD  senna-docusate (SENOKOT-S) 8.6-50 MG per tablet Take 1 tablet by mouth at bedtime as needed for mild constipation. 08/07/15  Yes Altamese Dilling, MD  Tamsulosin HCl (FLOMAX) 0.4 MG CAPS Take 0.4 mg by mouth at bedtime.    Yes Historical Provider, MD  traZODone (DESYREL) 50 MG tablet Take 50 mg by mouth at bedtime.   Yes Historical Provider, MD  valsartan (DIOVAN) 320 MG tablet Take 320 mg by mouth daily.     Yes Historical Provider, MD  warfarin (COUMADIN) 5 MG tablet Take as directed by  Anticoagulation clinic Patient taking differently: Take as directed by  Anticoagulation clinic (1.5 tablets daily) 08/17/15  Yes Luis Abed, MD      Allergies  Allergen Reactions  . Levofloxacin Nausea Only  . Sulfa Antibiotics Other (See Comments)    Reaction:  Unknown      ROS:  Out of a complete 14 system review of symptoms, the patient complains only of the following symptoms, and all other reviewed systems are negative.  Swelling in the legs Hearing loss Itching Easy bruising Joint pain Skin sensitivity Depression Restless legs  Blood pressure 162/66, pulse 76, height  (1.803 m), weight 198 lb (89.812 kg).  Physical Exam  General: The patient is alert and cooperative at the time of the examination.  Eyes: Pupils are equal, round, and reactive to light. Discs are flat bilaterally.  Neck: The neck is supple, no carotid bruits are  noted.  Respiratory: The respiratory examination is clear.  Cardiovascular: The cardiovascular examination reveals an occasional irregular heart rhythm, the patient has a valvular click that is most prominent in the aortic area.  Skin: Extremities are with 1+ edema on the right below the knee, 2-3+ edema on the left below the knee.  Neurologic Exam  Mental status: The patient is alert and oriented x 3 at the time of the examination. The patient has apparent normal recent and remote memory, with an apparently normal attention span and concentration ability.  Cranial nerves: Facial symmetry is present. There is good sensation of the face to pinprick and soft touch bilaterally. The strength of the facial muscles and the muscles to head turning and shoulder shrug are normal bilaterally. Speech is well enunciated, no aphasia or dysarthria is noted. Extraocular movements are full. Visual fields are full. The tongue is midline, and the patient has symmetric elevation of the soft palate. No obvious hearing deficits are noted.  Motor: The motor testing reveals 5 over 5 strength of all 4 extremities, with exception of some weakness of the intrinsic muscles of the hands bilaterally. Good symmetric motor tone is noted throughout.  Sensory: Sensory testing is intact to pinprick, soft touch, vibration sensation, and  position sense on all 4 extremities, with exception of a stocking pattern pinprick sensory deficit one half way up the leg on the left, not present on the right. No evidence of extinction is noted.  Coordination: Cerebellar testing reveals good finger-nose-finger and heel-to-shin bilaterally.  Gait and station: Gait is slightly wide-based, the patient walks independently. Tandem gait is unsteady. Romberg is negative. No drift is seen.  Reflexes: Deep tendon reflexes are symmetric, but are depressed bilaterally. Toes are downgoing bilaterally.   Assessment/Plan:  1. Restless leg syndrome  2. Chronic iron deficiency anemia  The patient has had restless leg syndrome over the last 2 or 3 years, he has also developed a chronic GI source of blood loss, with a chronic iron deficiency anemia. He indicates that he was placed on iron supplementation within the last 3 weeks, he has already noted some benefit with the restless leg syndrome. The patient has issues throughout the day with the restless legs, and the arms are not affected. Supplementation with iron should help the symptoms. The short acting Requip does not seem to be getting him through the night, the patient will be switched to an 8 mg extended-release Requip tablet, this will replace the short acting tablet. If he is not doing well with this medication change, he is to contact our office, otherwise he will follow-up in 4 months.  Marlan Palau MD 08/23/2015 7:43 PM  [facility] [address] [place], [place] [address]  Phone [phone] Fax [phone]

## 2015-08-24 ENCOUNTER — Telehealth: Payer: Self-pay | Admitting: Neurology

## 2015-08-24 NOTE — Telephone Encounter (Signed)
I called [facility] for prior authorization. It is currently pending for clinical review. [id number]. I called the patient to let him know this.

## 2015-08-24 NOTE — Telephone Encounter (Signed)
Patient is calling because [facility] on [address] in [place] says that a pre authorization is needed for medication rOPINIRole (REQUIP XL) 8 MG 24 hr tablet. Thank you.

## 2015-08-28 ENCOUNTER — Telehealth: Payer: Self-pay | Admitting: Neurology

## 2015-08-28 ENCOUNTER — Other Ambulatory Visit: Payer: Self-pay

## 2015-08-28 MED ORDER — ROPINIROLE HCL ER 2 MG PO TB24: 8.0000 mg | ORAL_TABLET | Freq: Every day | ORAL | Status: DC

## 2015-08-28 NOTE — Telephone Encounter (Signed)
I have reviewed prescribed Requip XL 2 mg 4 tablets every night per patient request, prescription was sent to his [facility] 360 tabs with 3 refills.

## 2015-08-28 NOTE — Telephone Encounter (Signed)
Requip XL 2 mg 4 tablets at bedtime, 30 tablets with 0 refills [facility] on [address]  in [place], per VO by Dr. Terrace Arabia.

## 2015-08-28 NOTE — Telephone Encounter (Addendum)
Pt requesting refill for rOPINIRole (REQUIP XL) 8 MG 24 hr tablet to be sent to [facility]. He also states directions should read to take  4x day. Pt states he will be out in 2 days and requesting it to shipped overnight or is there samples he could have until medication arrives. Please call and advise at [phone] or [phone]

## 2015-08-28 NOTE — Telephone Encounter (Signed)
Patient is calling back and states that the Requip 2 mg 4 X day should not be slow release as the slow release is not covered by his insurance.  Please call.

## 2015-08-28 NOTE — Telephone Encounter (Signed)
I called the patient and advised that Requip XL 2 mg 4 tablets at bedtime was sent to [facility]. Patient asked if a smaller Rx could be [facility]. I advised I would ask Dr. Terrace Arabia to do this.

## 2015-08-29 ENCOUNTER — Other Ambulatory Visit: Payer: Self-pay

## 2015-08-29 MED ORDER — ROPINIROLE HCL 2 MG PO TABS: 2.0000 mg | ORAL_TABLET | Freq: Four times a day (QID) | ORAL | Status: DC

## 2015-08-29 NOTE — Telephone Encounter (Signed)
Please see other phone note from 10/3.

## 2015-08-29 NOTE — Telephone Encounter (Signed)
Patient called back regarding rOPINIRole (REQUIP XL) 2 MG 24 hr tablet. Patient is very frustrated, says "he's told them twice that his insurance doesn't cover extended release but Dr. Anne Hahn keeps prescribing extended release. Extended release is very expensive".

## 2015-08-29 NOTE — Telephone Encounter (Signed)
Patient requests call back to [phone] or [phone] to let patient know when Rx gets sent to [facility] because he will need to call them to overnight it due to being low on medication.

## 2015-08-29 NOTE — Telephone Encounter (Signed)
I spoke to Dr. Vickey Huger. She is okay with giving the patient one more month of short acting Requip while we try to get a prior authorization for the extended release. Order for Requip 2 mg four times daily, 120 tablets with 0 refills entered per VO by Dr. Vickey Huger. [facility] [address] in [place]. I called the patient and explained the plan to him. He is okay with the plan.

## 2015-09-04 ENCOUNTER — Telehealth: Payer: Self-pay | Admitting: Neurology

## 2015-09-04 NOTE — Telephone Encounter (Signed)
Patient is calling as he need authorization for [facility] for Rx Ropinirole 8 mg slow release  24 hr tablet.  He is aware that you are already working on this but is faxing you some information he has that he states might be helpful.  Thanks!

## 2015-09-06 ENCOUNTER — Ambulatory Visit (INDEPENDENT_AMBULATORY_CARE_PROVIDER_SITE_OTHER): Payer: Medicare Other

## 2015-09-06 DIAGNOSIS — Z954 Presence of other heart-valve replacement: Secondary | ICD-10-CM | POA: Diagnosis not present

## 2015-09-06 DIAGNOSIS — I359 Nonrheumatic aortic valve disorder, unspecified: Secondary | ICD-10-CM

## 2015-09-06 DIAGNOSIS — I350 Nonrheumatic aortic (valve) stenosis: Secondary | ICD-10-CM

## 2015-09-06 DIAGNOSIS — Z7901 Long term (current) use of anticoagulants: Secondary | ICD-10-CM | POA: Diagnosis not present

## 2015-09-06 DIAGNOSIS — Z952 Presence of prosthetic heart valve: Secondary | ICD-10-CM

## 2015-09-06 LAB — POCT INR: INR: 2

## 2015-09-08 ENCOUNTER — Telehealth: Payer: Self-pay | Admitting: Neurology

## 2015-09-08 NOTE — Telephone Encounter (Signed)
I called patient. The extended-release Requip has been denied on 2 occasions by Medicare secondary to lack of FDA indication for restless leg syndrome. I have called the patient, recommended the Neupro patch which would be long-acting, and does have an indication for restless leg syndrome. If the patient is amenable to this, I will call in a prescription.

## 2015-09-12 NOTE — Telephone Encounter (Signed)
Patient returned Dr. Anne HahnWillis' call. Wanted to let Dr. Anne HahnWillis know that he is "not interested in the Neuro Patch at this time. Has a good supply of rOPINIRole (REQUIP) 2 MG tablet left. Can discuss this again at a later date".

## 2015-09-25 ENCOUNTER — Ambulatory Visit (INDEPENDENT_AMBULATORY_CARE_PROVIDER_SITE_OTHER): Payer: Medicare Other | Admitting: Cardiovascular Disease

## 2015-09-25 ENCOUNTER — Ambulatory Visit (INDEPENDENT_AMBULATORY_CARE_PROVIDER_SITE_OTHER): Payer: Medicare Other

## 2015-09-25 ENCOUNTER — Encounter: Payer: Self-pay | Admitting: Cardiovascular Disease

## 2015-09-25 VITALS — BP 130/80 | HR 78 | Ht 71.0 in | Wt 199.5 lb

## 2015-09-25 DIAGNOSIS — I2510 Atherosclerotic heart disease of native coronary artery without angina pectoris: Secondary | ICD-10-CM

## 2015-09-25 DIAGNOSIS — I359 Nonrheumatic aortic valve disorder, unspecified: Secondary | ICD-10-CM | POA: Diagnosis not present

## 2015-09-25 DIAGNOSIS — I10 Essential (primary) hypertension: Secondary | ICD-10-CM

## 2015-09-25 DIAGNOSIS — R0602 Shortness of breath: Secondary | ICD-10-CM

## 2015-09-25 DIAGNOSIS — R600 Localized edema: Secondary | ICD-10-CM | POA: Insufficient documentation

## 2015-09-25 DIAGNOSIS — I350 Nonrheumatic aortic (valve) stenosis: Secondary | ICD-10-CM

## 2015-09-25 DIAGNOSIS — Z7901 Long term (current) use of anticoagulants: Secondary | ICD-10-CM | POA: Diagnosis not present

## 2015-09-25 DIAGNOSIS — Z952 Presence of prosthetic heart valve: Secondary | ICD-10-CM

## 2015-09-25 DIAGNOSIS — Z954 Presence of other heart-valve replacement: Secondary | ICD-10-CM

## 2015-09-25 LAB — POCT INR: INR: 2.9

## 2015-09-25 MED ORDER — FUROSEMIDE 20 MG PO TABS: 20.0000 mg | ORAL_TABLET | Freq: Every day | ORAL | Status: DC | PRN

## 2015-09-25 NOTE — Assessment & Plan Note (Signed)
Currently with no symptoms of angina. No further workup at this time. Continue current medication regimen. 

## 2015-09-25 NOTE — Assessment & Plan Note (Signed)
Echocardiogram May 2016, well-functioning valve

## 2015-09-25 NOTE — Assessment & Plan Note (Signed)
Recommended he hold the amlodipine given severe leg swelling Suggested he monitor blood pressure at home Call our office for systolic pressures more the him 140

## 2015-09-25 NOTE — Progress Notes (Signed)
Patient ID: [name], male    DOB: May 02, 1931, 81 y.o.   MRN: [id number]  HPI Comments: 81 year old male with h/o aortic stenosis s/p St. Jude mechanical aortic valve replacement in 2002 on chronic Coumadin therapy, mild CAD by cardiac cath in 2011 managed medically, GERD, HTN, carotid bruits, history of asymptomatic bradycardia, RLS, anxiety, depression, and osteoarthritis s/p total left hip replacement on 03/21/2015 who was recently discharged from [facility]/2 the above surgery with a hgb of 8 (baseline 12), he was started on po iron replacement therapy, who presented to on 04/02/15 with acute onset of weakness, melena, BRBPR, and was found to have a hgb of 5.1. Aspirin and warfarin initially held, restarted at a later date, S/p EGD He presents today for follow-up of his aortic valve replacement  In follow-up, he denies any further bleeding on warfarin. No longer takes aspirin  echo  in May 2016 demonstrated valve was functioning well He had bacteremia with staph, previously treated with ABX  In follow-up he reports having significant leg swelling He does have Lasix  but does not take this Reports having some blistering at times, denies any abdominal bloating or shortness of breath   INR 2.9 on today's visit   Allergies  Allergen Reactions  . Levofloxacin Nausea Only  . Sulfa Antibiotics Other (See Comments)    Reaction:  Unknown     Current Outpatient Prescriptions on File Prior to Visit  Medication Sig Dispense Refill  . acetaminophen (TYLENOL) 500 MG tablet Take 500 mg by mouth every 4 (four) hours as needed for mild pain or headache.     . ALPRAZolam (XANAX) 0.5 MG tablet Take 0.5 tablets (0.25 mg total) by mouth daily as needed for anxiety. (Patient taking differently: Take 0.5 mg by mouth daily as needed for anxiety. ) 10 tablet 0  . citalopram (CELEXA) 20 MG tablet Take 20 mg by mouth daily.    . ferrous sulfate 325 (65 FE) MG tablet Take 1 tablet (325 mg total) by mouth  2 (two) times daily with a meal. 60 tablet 6  . finasteride (PROSCAR) 5 MG tablet Take 5 mg by mouth daily.      . Multiple Vitamin (MULTIVITAMIN WITH MINERALS) TABS tablet Take 1 tablet by mouth daily.    . pantoprazole (PROTONIX) 40 MG tablet Take 1 tablet (40 mg total) by mouth daily. 30 tablet 2  . rOPINIRole (REQUIP) 2 MG tablet Take 1 tablet (2 mg total) by mouth every 6 (six) hours. 120 tablet 0  . senna-docusate (SENOKOT-S) 8.6-50 MG per tablet Take 1 tablet by mouth at bedtime as needed for mild constipation. 30 tablet 0  . Tamsulosin HCl (FLOMAX) 0.4 MG CAPS Take 0.4 mg by mouth at bedtime.     . traZODone (DESYREL) 50 MG tablet Take 50 mg by mouth at bedtime.    . valsartan (DIOVAN) 320 MG tablet Take 320 mg by mouth daily.      Marland Kitchen. warfarin (COUMADIN) 5 MG tablet Take as directed by  Anticoagulation clinic (Patient taking differently: Take as directed by  Anticoagulation clinic (1.5 tablets daily)) 150 tablet 0   No current facility-administered medications on file prior to visit.    Past Medical History  Diagnosis Date  . GERD (gastroesophageal reflux disease)   . Bradycardia     chronic, no symptoms 07/2010  . Depression   . Anxiety 10/11  . SOB (shortness of breath) 10/11    08/2010,Episodes at 5 AM, eventually felt  to be anxiety, after complete workup including catheterization, pt greatly improved with anxiety meds 11/11  . Hypertension     BP higher than usual 04/19/10; amlodipine increased by telephone  . Decreased hearing     Right ear  . Coronary artery disease     mild, cath, 08/2010  . Aortic stenosis     a. s/p mechcanical AVR, 2002  . S/P AVR     a. St. Jude. mechanical 2002; b. echo 08/2010 EF 60%, trival AI, mild MR, AVR working well; c. on longterm warfarin tx  . Carotid bruit     dopplers in past, no abnormalities  . Gastric ulcer   . C. difficile colitis   . RLS (restless legs syndrome) 08/23/2015    Past Surgical History  Procedure Laterality Date  .  Valve replacement  1/02    Aortic; echo 3/09 valve working well; echo 10/11 working well; put on Coumadin  . Cardiac catheterization    . Joint replacement    . Hernia repair    . Esophagogastroduodenoscopy N/A 04/05/2015    Procedure: ESOPHAGOGASTRODUODENOSCOPY (EGD);  Surgeon: Scot Jun, MD;  Location: [facility] ENDOSCOPY;  Service: Endoscopy;  Laterality: N/A;  . Esophagogastroduodenoscopy N/A 04/17/2015    Procedure: ESOPHAGOGASTRODUODENOSCOPY (EGD);  Surgeon: Scot Jun, MD;  Location: [facility] ENDOSCOPY;  Service: Endoscopy;  Laterality: N/A;  . Esophagogastroduodenoscopy N/A 08/02/2015    Procedure: ESOPHAGOGASTRODUODENOSCOPY (EGD);  Surgeon: Wallace Cullens, MD;  Location: [facility] ENDOSCOPY;  Service: Endoscopy;  Laterality: N/A;  . Total hip arthroplasty      Social History  reports that he has quit smoking. He has never used smokeless tobacco. He reports that he drinks about 1.8 oz of alcohol per week. He reports that he does not use illicit drugs.  Family History family history includes Diabetes type II in his mother; Heart attack in his mother; Heart disease in his father and mother; Hypertension in his father, mother, and another family member; Stroke in his brother, brother, and father.   Review of Systems  Constitutional: Negative.   Respiratory: Negative.   Cardiovascular: Positive for leg swelling.  Gastrointestinal: Negative.   Musculoskeletal: Negative.   Neurological: Negative.   Hematological: Negative.   Psychiatric/Behavioral: Negative.   All other systems reviewed and are negative.   BP 130/80 mmHg  Pulse 78  Ht  (1.803 m)  Wt 199 lb 8 oz (90.493 kg)  BMI 27.84 kg/m2   Physical Exam  Constitutional: He is oriented to person, place, and time. He appears well-developed and well-nourished.  HENT:  Head: Normocephalic.  Nose: Nose normal.  Mouth/Throat: Oropharynx is clear and moist.  Eyes: Conjunctivae are normal. Pupils are equal, round, and reactive  to light.  Neck: Normal range of motion. Neck supple. No JVD present.  Cardiovascular: Normal rate, regular rhythm and intact distal pulses.  Exam reveals no gallop and no friction rub.   Murmur heard.  Systolic murmur is present with a grade of 2/6  1+ pitting edema to below the knees bilaterally  Pulmonary/Chest: Effort normal and breath sounds normal. No respiratory distress. He has no wheezes. He has no rales. He exhibits no tenderness.  Abdominal: Soft. Bowel sounds are normal. He exhibits no distension. There is no tenderness.  Musculoskeletal: Normal range of motion. He exhibits edema. He exhibits no tenderness.  Lymphadenopathy:    He has no cervical adenopathy.  Neurological: He is alert and oriented to person, place, and time. Coordination normal.  Skin: Skin is warm  and dry. No rash noted. No erythema.  Psychiatric: He has a normal mood and affect. His behavior is normal. Judgment and thought content normal.

## 2015-09-25 NOTE — Patient Instructions (Addendum)
For your leg swelling, Stop the amlodipine  Please take lasix three times a week Take with banana  Please take your blood pressure a couple times a week, Call the office if this runs high all of the time (>140)  Please call us if you have new issues that need to be addressed before your next appt.  Your physician wants you to follow-up in: 3 months.  You will receive a reminder letter in the mail two months in advance. If you don't receive a letter, please call our office to schedule the follow-up appointment.

## 2015-09-25 NOTE — Assessment & Plan Note (Signed)
Leg edema likely exacerbated by amlodipine We have recommended he stop the amlodipine, take Lasix on Monday Wednesday and Friday with a banana or potassium pill He'll monitor his blood pressure at home

## 2015-09-25 NOTE — Assessment & Plan Note (Signed)
Prior echocardiogram in May 2016 with moderate to severe pulmonary hypertension Recommended he take Lasix at least 3 times per week with potassium/banana

## 2015-09-28 ENCOUNTER — Telehealth: Payer: Self-pay | Admitting: *Deleted

## 2015-09-28 NOTE — Telephone Encounter (Signed)
Pt calling giving us an update on the changes medication we gave him. He was told to be off Amlodipine for it may have been the reason for his retaining fluid.  He was also told to monitor BP and he states that it was too high and had restarted taking it yesterday. He is asking since he that happen is there another BP medication he may be able try. He just does not feel like he can come off that medication.  Please advise

## 2015-09-28 NOTE — Telephone Encounter (Signed)
Pt attempted to hold amlodipine, but reports his BP is too high w/o it, so he resumed before he could notice a difference in his legs. He is requesting an alternative BP med to try that will not cause leg swelling.  Please advise.  Thank you.

## 2015-10-03 ENCOUNTER — Telehealth: Payer: Self-pay

## 2015-10-03 NOTE — Telephone Encounter (Signed)
What is blood pressure? List shows he is taking valsartan. Taking lasix 3 x per week?  Would confirm he is taking lasix  Also should be on potassium when he takes lasix  For BP, Would start cardura 8 mg pill Start 1/2 pil in the evening for one week, Monitor BP If it continues to run high, change cardura to 4 mg twice a day  Hold amlodipine

## 2015-10-03 NOTE — Telephone Encounter (Signed)
Please see previous phone note.  

## 2015-10-03 NOTE — Telephone Encounter (Signed)
Coralee RudSabrina F Gilley at 10/03/2015 4:18 PM              Pt states Dr. Mariah MillingGollan took him off Amlodipine. Asks if there is anything else he can take for his BP. Please advise.

## 2015-10-03 NOTE — Telephone Encounter (Signed)
Pt states Dr. Mariah MillingGollan took him off Amlodipine. Asks if there is anything else he can take for his BP. Please advise.

## 2015-10-04 ENCOUNTER — Telehealth: Payer: Self-pay | Admitting: Neurology

## 2015-10-04 MED ORDER — ROTIGOTINE 4 MG/24HR TD PT24: 1.0000 | MEDICATED_PATCH | Freq: Every day | TRANSDERMAL | Status: DC

## 2015-10-04 MED ORDER — DOXAZOSIN MESYLATE 8 MG PO TABS: 4.0000 mg | ORAL_TABLET | Freq: Every day | ORAL | Status: DC

## 2015-10-04 NOTE — Telephone Encounter (Signed)
The patient was on Requip 2 mg tablets 4 times daily, switching over to the extended-release Requip was not covered, I will try calling in a prescription for the Neupro patch.

## 2015-10-04 NOTE — Telephone Encounter (Signed)
Pt called sts rOPINIRole (REQUIP) 2 MG tablet has been denied by [facility] but to wants to try the neuropatch if that's what Dr Anne HahnWillis has in mind. Please call and advise.

## 2015-10-04 NOTE — Telephone Encounter (Signed)
Spoke w/ pt.  He reports that his BP has been 150/76, 182/79, when nurse checks it, it is generally 160-170 systolic. Pt confirms that he is taking lasix 3x per week w/ his K+ and taking valsartan as directed. He has been holding amlodipine and his leg swelling has improved.  Advised pt of Dr. Windell HummingbirdGollan's recommendation of cardura. He is agreeable and verbalizes understanding of directions. Asked him to call back if BP remains elevated.  He is appreciative of the call and will let us know if his readings do not improve.

## 2015-10-05 MED ORDER — ROPINIROLE HCL 2 MG PO TABS: 2.0000 mg | ORAL_TABLET | Freq: Three times a day (TID) | ORAL | Status: DC

## 2015-10-05 NOTE — Telephone Encounter (Signed)
I contacted ins and provided clinical info, asking that they grant an exception, and cover Neupro.  Request is currently under review Ref #  [id number]  [facility]([employer]) has approved the request for coverage on Neupro effective until 11/24/2016 Ref # [id number].  I called the patient back to advise at home, got no answer.  VM has not been set up, unable to leave message.  Called cell, got no answer.  Left message relaying this info.  Asked that he call back if he has any questions, or if anything further is needed.

## 2015-10-05 NOTE — Telephone Encounter (Addendum)
Pt called and states that a Rx was sent in for Requip extended release. His insurance will not cover the extended release. It will cover "plan ole " Requip. Pt states that he is completely out of medication and would like a Rx sent to [facility] for a 30 day supply and to send the rest to the mail order pharmacy. Pt requesting a call back when done with [facility]. May call [phone] or [phone]. Thank you

## 2015-10-05 NOTE — Telephone Encounter (Signed)
I will call in a 30 day supply of the ropinirole.

## 2015-10-05 NOTE — Telephone Encounter (Signed)
Pt called sts neuropatch needs prior auth. He sts he wants Requip called into pharmacy, 30 day supply, he is willing to pay out of pocket. sts he needs this medication until neuropatch is approved. Please call and touch base with pt

## 2015-10-05 NOTE — Addendum Note (Signed)
Addended by: Stephanie AcreWILLIS, Terran Hollenkamp on: 10/05/2015 04:56 PM   Modules accepted: Orders

## 2015-10-05 NOTE — Telephone Encounter (Signed)
Pt called sts he rec'd call that patch has been approved but he still needs the oral medication.

## 2015-10-05 NOTE — Telephone Encounter (Signed)
I called back and spoke with the patient.  He is aware we were able to get a prior auth approval through ins for Neupro Patch.  Patient stated he would also like to get another Rx for regular release Ropinirole 2mg  tabs, 30 day supply only, [facility].  (wants to "just have them on hand")  I explained it appears the Neupro patch was [facility], however, says he does not wish to fill this Rx with them at this time.  Says he will likely use mail order for the patches.  I offered to send Rx to mail order, but he declined, saying he is trying to straighten his meds out with mail order first, he has asked them to hold his Rx's.  Would you like to prescribe Ropinirole?  Please advise.  Thank you.

## 2015-10-10 MED ORDER — ROPINIROLE HCL 2 MG PO TABS: 2.0000 mg | ORAL_TABLET | Freq: Three times a day (TID) | ORAL | Status: DC

## 2015-10-10 NOTE — Addendum Note (Signed)
Addended by: Stephanie AcreWILLIS, Victormanuel Mclure on: 10/10/2015 03:04 PM   Modules accepted: Orders, Medications

## 2015-10-10 NOTE — Telephone Encounter (Signed)
I called patient. He could not afford the Neupro patch, I will convert him back to the Requip taking 2 mg 3 times daily. I have called in a 90 day prescription for him.

## 2015-10-10 NOTE — Telephone Encounter (Signed)
I called the patient. He cannot afford the Neupro patch. It was going to cost $150 per month. He would like to continue taking Requip 2 mg every 6 hours. I advised I would ask Dr. Anne HahnWillis about this.

## 2015-10-10 NOTE — Telephone Encounter (Addendum)
Pt called and states that he can not afford the Neupro patch. He would like to continue taking the Ropinirole 2mg  for every 6 hrs if need be. Please call and advise [phone]

## 2015-10-14 ENCOUNTER — Other Ambulatory Visit: Payer: Self-pay

## 2015-10-16 ENCOUNTER — Ambulatory Visit (INDEPENDENT_AMBULATORY_CARE_PROVIDER_SITE_OTHER): Payer: Medicare Other

## 2015-10-16 DIAGNOSIS — Z7901 Long term (current) use of anticoagulants: Secondary | ICD-10-CM

## 2015-10-16 DIAGNOSIS — Z952 Presence of prosthetic heart valve: Secondary | ICD-10-CM

## 2015-10-16 DIAGNOSIS — I359 Nonrheumatic aortic valve disorder, unspecified: Secondary | ICD-10-CM | POA: Diagnosis not present

## 2015-10-16 DIAGNOSIS — I350 Nonrheumatic aortic (valve) stenosis: Secondary | ICD-10-CM

## 2015-10-16 DIAGNOSIS — Z954 Presence of other heart-valve replacement: Secondary | ICD-10-CM | POA: Diagnosis not present

## 2015-10-16 LAB — POCT INR: INR: 3.3

## 2015-10-16 NOTE — Telephone Encounter (Signed)
I called the pharmacy and spoke with [place].  She verified Ropinirole is covered under ins, no prior Berkley Harveyauth is required.  Says last claim paid on 11/11 for $8.  I called the patient back to clarify.  Got no answer.  Left message.

## 2015-10-16 NOTE — Telephone Encounter (Addendum)
Pt called and states that Prior auth on Requip from [facility]. May call pt [phone]

## 2015-10-23 ENCOUNTER — Other Ambulatory Visit: Payer: Self-pay | Admitting: Neurology

## 2015-10-23 MED ORDER — ROPINIROLE HCL 2 MG PO TABS: 2.0000 mg | ORAL_TABLET | Freq: Four times a day (QID) | ORAL | Status: DC

## 2015-10-23 NOTE — Addendum Note (Signed)
Addended by: Stephanie AcreWILLIS, CHARLES on: 10/23/2015 01:15 PM   Modules accepted: Orders

## 2015-10-23 NOTE — Telephone Encounter (Signed)
Patient called to advise after last conversation with Dr. Anne HahnWillis, rOPINIRole (REQUIP) 2 MG tablet was changed from every 8 hrs to every 6 hrs. [facility] does not have this change.

## 2015-10-23 NOTE — Telephone Encounter (Signed)
I called the patient.I will change the prescription of the Requip to a 2 mg tablet 4 times daily.

## 2015-11-02 ENCOUNTER — Telehealth: Payer: Self-pay | Admitting: *Deleted

## 2015-11-02 NOTE — Telephone Encounter (Signed)
Returned call to pt, advised Green Tea does contain vit K and can decrease INR.  Pt has INR check 11/08/15, advised to start drinking and we will check next week and adjust Coumadin if need be.

## 2015-11-02 NOTE — Telephone Encounter (Signed)
Pt calling asking is it okay for him to drink Green Tea while being on coumadin

## 2015-11-08 ENCOUNTER — Ambulatory Visit: Payer: Medicare Other

## 2015-11-08 DIAGNOSIS — Z7901 Long term (current) use of anticoagulants: Secondary | ICD-10-CM | POA: Diagnosis not present

## 2015-11-08 DIAGNOSIS — Z954 Presence of other heart-valve replacement: Secondary | ICD-10-CM | POA: Diagnosis not present

## 2015-11-08 DIAGNOSIS — I350 Nonrheumatic aortic (valve) stenosis: Secondary | ICD-10-CM

## 2015-11-08 DIAGNOSIS — Z952 Presence of prosthetic heart valve: Secondary | ICD-10-CM

## 2015-11-08 LAB — POCT INR: INR: 3.6

## 2015-11-29 ENCOUNTER — Ambulatory Visit (INDEPENDENT_AMBULATORY_CARE_PROVIDER_SITE_OTHER): Payer: Medicare Other

## 2015-11-29 DIAGNOSIS — Z954 Presence of other heart-valve replacement: Secondary | ICD-10-CM

## 2015-11-29 DIAGNOSIS — I350 Nonrheumatic aortic (valve) stenosis: Secondary | ICD-10-CM | POA: Diagnosis not present

## 2015-11-29 DIAGNOSIS — Z952 Presence of prosthetic heart valve: Secondary | ICD-10-CM

## 2015-11-29 DIAGNOSIS — Z7901 Long term (current) use of anticoagulants: Secondary | ICD-10-CM

## 2015-11-29 LAB — POCT INR: INR: 3.2

## 2015-12-04 ENCOUNTER — Other Ambulatory Visit: Payer: Self-pay | Admitting: Cardiology

## 2015-12-04 ENCOUNTER — Telehealth (HOSPITAL_COMMUNITY): Payer: Self-pay | Admitting: *Deleted

## 2015-12-04 MED ORDER — WARFARIN SODIUM 5 MG PO TABS
ORAL_TABLET | ORAL | Status: DC
Start: 2015-12-04 — End: 2016-07-09

## 2015-12-04 NOTE — Telephone Encounter (Signed)
Pt needs warfarin refilled/ will forward to coumadin clinic.

## 2015-12-20 ENCOUNTER — Telehealth: Payer: Self-pay | Admitting: *Deleted

## 2015-12-20 NOTE — Telephone Encounter (Signed)
Pt calling stating he went to PCP on Monday and states that he was to be back on Amlodipine  Would like to know if he can go back on it or just until 12/26/15 when he comes to see [place] Please advise

## 2015-12-20 NOTE — Telephone Encounter (Signed)
He wants to make sure you're agreeable to changes PCP made to his meds.

## 2015-12-21 NOTE — Telephone Encounter (Signed)
Amlodipine held in the past as this can cause leg swelling He is welcome to retry and stop if leg swelling comes back Usually takes a few weeks for swelling to resolve after stopping the pil

## 2015-12-22 NOTE — Telephone Encounter (Signed)
Left message for pt to call back  °

## 2015-12-22 NOTE — Telephone Encounter (Signed)
Spoke w/ pt.  Advised him of Dr. Windell Hummingbird recommendation.  He is agreeable and will keep his appt on Tuesday.

## 2015-12-26 ENCOUNTER — Ambulatory Visit: Payer: Medicare Other | Admitting: Cardiovascular Disease

## 2015-12-26 ENCOUNTER — Ambulatory Visit (INDEPENDENT_AMBULATORY_CARE_PROVIDER_SITE_OTHER): Payer: Medicare Other

## 2015-12-26 DIAGNOSIS — Z7901 Long term (current) use of anticoagulants: Secondary | ICD-10-CM | POA: Diagnosis not present

## 2015-12-26 DIAGNOSIS — I350 Nonrheumatic aortic (valve) stenosis: Secondary | ICD-10-CM | POA: Diagnosis not present

## 2015-12-26 DIAGNOSIS — I359 Nonrheumatic aortic valve disorder, unspecified: Secondary | ICD-10-CM | POA: Diagnosis not present

## 2015-12-26 DIAGNOSIS — Z954 Presence of other heart-valve replacement: Secondary | ICD-10-CM | POA: Diagnosis not present

## 2015-12-26 DIAGNOSIS — Z952 Presence of prosthetic heart valve: Secondary | ICD-10-CM

## 2015-12-26 LAB — POCT INR: INR: 2.5

## 2016-01-02 ENCOUNTER — Encounter: Payer: Self-pay | Admitting: Physician Assistant

## 2016-01-03 ENCOUNTER — Encounter: Payer: Self-pay | Admitting: Physician Assistant

## 2016-01-03 ENCOUNTER — Ambulatory Visit (INDEPENDENT_AMBULATORY_CARE_PROVIDER_SITE_OTHER): Payer: Medicare Other | Admitting: Physician Assistant

## 2016-01-03 VITALS — BP 114/60 | HR 77 | Ht 71.0 in | Wt 202.0 lb

## 2016-01-03 DIAGNOSIS — Z96643 Presence of artificial hip joint, bilateral: Secondary | ICD-10-CM

## 2016-01-03 DIAGNOSIS — Z954 Presence of other heart-valve replacement: Secondary | ICD-10-CM

## 2016-01-03 DIAGNOSIS — I2510 Atherosclerotic heart disease of native coronary artery without angina pectoris: Secondary | ICD-10-CM

## 2016-01-03 DIAGNOSIS — R5383 Other fatigue: Secondary | ICD-10-CM

## 2016-01-03 DIAGNOSIS — Z952 Presence of prosthetic heart valve: Secondary | ICD-10-CM

## 2016-01-03 DIAGNOSIS — I10 Essential (primary) hypertension: Secondary | ICD-10-CM

## 2016-01-03 DIAGNOSIS — R002 Palpitations: Secondary | ICD-10-CM

## 2016-01-03 DIAGNOSIS — I350 Nonrheumatic aortic (valve) stenosis: Secondary | ICD-10-CM | POA: Diagnosis not present

## 2016-01-03 NOTE — Patient Instructions (Addendum)
Medication Instructions:  Please continue your current medications  Labwork: CBC, BMET  Testing/Procedures: Your physician has recommended that you wear a holter monitor. Holter monitors are medical devices that record the heart's electrical activity. Doctors most often use these monitors to diagnose arrhythmias. Arrhythmias are problems with the speed or rhythm of the heartbeat. The monitor is a small, portable device. You can wear one while you do your normal daily activities. This is usually used to diagnose what is causing palpitations/syncope (passing out).  Date & time:____________________________________  Your physician has requested that you have an echocardiogram. Echocardiography is a painless test that uses sound waves to create images of your heart. It provides your doctor with information about the size and shape of your heart and how well your heart's chambers and valves are working. This procedure takes approximately one hour. There are no restrictions for this procedure.  Date & time: _______________________________________  Follow-Up: 2-3 weeks  Date & time: ________________________________________  If you need a refill on your cardiac medications before your next appointment, please call your pharmacy.  Holter Monitoring A Holter monitor is a small device that is used to detect abnormal heart rhythms. It clips to your clothing and is connected by wires to flat, sticky disks (electrodes) that attach to your chest. It is worn continuously for 24-48 hours. HOME CARE INSTRUCTIONS  Wear your Holter monitor at all times, even while exercising and sleeping, for as long as directed by your health care provider.  Make sure that the Holter monitor is safely clipped to your clothing or close to your body as recommended by your health care provider.  Do not get the monitor or wires wet.  Do not put body lotion or moisturizer on your chest.  Keep your skin clean.  Keep a  diary of your daily activities, such as walking and doing chores. If you feel that your heartbeat is abnormal or that your heart is fluttering or skipping a beat:  Record what you are doing when it happens.  Record what time of day the symptoms occur.  Return your Holter monitor as directed by your health care provider.  Keep all follow-up visits as directed by your health care provider. This is important. SEEK IMMEDIATE MEDICAL CARE IF:  You feel lightheaded or you faint.  You have trouble breathing.  You feel pain in your chest, upper arm, or jaw.  You feel sick to your stomach and your skin is pale, cool, or damp.  You heartbeat feels unusual or abnormal.   This information is not intended to replace advice given to you by your health care provider. Make sure you discuss any questions you have with your health care provider.   Document Released: 08/09/2004 Document Revised: 12/02/2014 Document Reviewed: 06/20/2014 Elsevier Interactive Patient Education 2016 [employer]. Echocardiogram An echocardiogram, or echocardiography, uses sound waves (ultrasound) to produce an image of your heart. The echocardiogram is simple, painless, obtained within a short period of time, and offers valuable information to your health care provider. The images from an echocardiogram can provide information such as:  Evidence of coronary artery disease (CAD).  Heart size.  Heart muscle function.  Heart valve function.  Aneurysm detection.  Evidence of a past heart attack.  Fluid buildup around the heart.  Heart muscle thickening.  Assess heart valve function. LET [facility] CARE PROVIDER KNOW ABOUT:  Any allergies you have.  All medicines you are taking, including vitamins, herbs, eye drops, creams, and over-the-counter medicines.  Previous  problems you or members of your family have had with the use of anesthetics.  Any blood disorders you have.  Previous surgeries you have  had.  Medical conditions you have.  Possibility of pregnancy, if this applies. BEFORE THE PROCEDURE  No special preparation is needed. Eat and drink normally.  PROCEDURE   In order to produce an image of your heart, gel will be applied to your chest and a wand-like tool (transducer) will be moved over your chest. The gel will help transmit the sound waves from the transducer. The sound waves will harmlessly bounce off your heart to allow the heart images to be captured in real-time motion. These images will then be recorded.  You may need an IV to receive a medicine that improves the quality of the pictures. AFTER THE PROCEDURE You may return to your normal schedule including diet, activities, and medicines, unless your health care provider tells you otherwise.   This information is not intended to replace advice given to you by your health care provider. Make sure you discuss any questions you have with your health care provider.   Document Released: 11/08/2000 Document Revised: 12/02/2014 Document Reviewed: 07/19/2013 Elsevier Interactive Patient Education [employer]

## 2016-01-03 NOTE — Progress Notes (Signed)
Cardiology Office Note Date:  01/03/2016  Patient ID:  [name] 13-Nov-1931, MRN [id number] PCP:  [name], MD  Cardiologist:  Dr. Mariah Milling, MD    Chief Complaint: Follow up, some fatigue  History of Present Illness: [name] is a 82 y.o. male with history of CAD medically managed by cardiac cath in 2011, aortic stenosis s/p SJM mechanical aortic valve replacement in 2002 on Coumadin, asymptomatic bradycardia, carotid bruits, HTN, GERD, RLS, anxiety, depression, and osteoarthritis s/p total left hip replacement on 03/21/2015 who presents for routine follow up of the above. He developed BRBPR and melena in late May 2016 s/p hip replacement, and was found to have a hgb of 5.1. He required two transfusions with pRBC. His aspirin and warfarin were briefly held at that time. He was also found to be MRSA bactermeic during this admission with. TTE was done to evaluate for possible endocarditis, showed EF 60-65%, no RWMA, GR1DD, aortic valve was poorly visualized and unable to exclude vegetation. Transvalvular velocity was increased. Mechanical prosthesis was present. Mild to moderate MR. Mild biatrial enlargement. Moderate TR. PASP 65 mm Hg. During his above hip replacement he was discharged from the hospital with a hgb of 8, baseline of 12. He was started on po iron therapy. He was also noted to have a new left upper extremity DVT during this admission s/p holding of his Coumadin. EGD showed gastritis, no ulcer. At discharge his hgb remained stable. He has since been restarted on Coumadin without aspirin and doing well.   He notes some fatigue with exertion. No chest pain, SOB, tachy-palpitations, nausea, vomiting, presyncope, or syncope. No BRBPR, or melena. His CBC was checked through GI on 12/11/2015 with a hgb of 14.7. He stopped iron supplements at that time. He is otherwise doing well.    Past Medical History  Diagnosis Date  . GERD (gastroesophageal reflux  disease)   . Bradycardia     chronic, no symptoms 07/2010  . Depression   . Anxiety 10/11  . SOB (shortness of breath) 10/11    08/2010,Episodes at 5 AM, eventually felt to be anxiety, after complete workup including catheterization, pt greatly improved with anxiety meds 11/11  . Hypertension     BP higher than usual 04/19/10; amlodipine increased by telephone  . Decreased hearing     Right ear  . Coronary artery disease     a. mild, cath, 08/2010; b. medically managed  . Aortic stenosis     a. s/p mechcanical AVR, 2002  . S/P AVR     a. St. Jude. mechanical 2002; b. echo 08/2010 EF 60%, trival AI, mild MR, AVR working well; c. on longterm warfarin tx  . Carotid bruit     dopplers in past, no abnormalities  . Gastric ulcer   . C. difficile colitis   . RLS (restless legs syndrome) 08/23/2015  . Chronic diastolic CHF (congestive heart failure) (HCC)     a. echo 03/2015: EF 60-65%, no RWMA, GR1DD, aortic valve poorly visualized, unable to excluded vegetation, increased transvalvular velocity, mechanical valve is present, mild biatrial enalrgement, mild to mod MR, mod TR, PASP 65 mm Hg    Past Surgical History  Procedure Laterality Date  . Valve replacement  1/02    Aortic; echo 3/09 valve working well; echo 10/11 working well; put on Coumadin  . Cardiac catheterization    . Joint replacement    . Hernia repair    . Esophagogastroduodenoscopy N/A 04/05/2015  Procedure: ESOPHAGOGASTRODUODENOSCOPY (EGD);  Surgeon: Scot Jun, MD;  Location: [facility] ENDOSCOPY;  Service: Endoscopy;  Laterality: N/A;  . Esophagogastroduodenoscopy N/A 04/17/2015    Procedure: ESOPHAGOGASTRODUODENOSCOPY (EGD);  Surgeon: Scot Jun, MD;  Location: [facility] ENDOSCOPY;  Service: Endoscopy;  Laterality: N/A;  . Esophagogastroduodenoscopy N/A 08/02/2015    Procedure: ESOPHAGOGASTRODUODENOSCOPY (EGD);  Surgeon: Wallace Cullens, MD;  Location: [facility] ENDOSCOPY;  Service: Endoscopy;  Laterality: N/A;  . Total hip  arthroplasty      Current Outpatient Prescriptions  Medication Sig Dispense Refill  . acetaminophen (TYLENOL) 500 MG tablet Take 500 mg by mouth every 4 (four) hours as needed for mild pain or headache.     . ALPRAZolam (XANAX) 0.5 MG tablet Take 0.5 tablets (0.25 mg total) by mouth daily as needed for anxiety. (Patient taking differently: Take 0.5 mg by mouth daily as needed for anxiety. ) 10 tablet 0  . citalopram (CELEXA) 20 MG tablet Take 20 mg by mouth daily.    Marland Kitchen doxazosin (CARDURA) 8 MG tablet Take 0.5 tablets (4 mg total) by mouth daily. 30 tablet 6  . finasteride (PROSCAR) 5 MG tablet Take 5 mg by mouth daily.      . furosemide (LASIX) 20 MG tablet Take 1 tablet (20 mg total) by mouth daily as needed. 30 tablet 6  . Multiple Vitamin (MULTIVITAMIN WITH MINERALS) TABS tablet Take 1 tablet by mouth daily.    . pantoprazole (PROTONIX) 40 MG tablet Take 1 tablet (40 mg total) by mouth daily. 30 tablet 2  . rOPINIRole (REQUIP) 2 MG tablet Take 1 tablet (2 mg total) by mouth 4 (four) times daily. 360 tablet 1  . senna-docusate (SENOKOT-S) 8.6-50 MG per tablet Take 1 tablet by mouth at bedtime as needed for mild constipation. 30 tablet 0  . Tamsulosin HCl (FLOMAX) 0.4 MG CAPS Take 0.4 mg by mouth at bedtime.     . traZODone (DESYREL) 50 MG tablet Take 50 mg by mouth at bedtime.    . valsartan (DIOVAN) 320 MG tablet Take 320 mg by mouth daily.      Marland Kitchen warfarin (COUMADIN) 5 MG tablet Take as directed by [facility] 150 tablet 1   No current facility-administered medications for this visit.    Allergies:   Levofloxacin and Sulfa antibiotics   Social History:  The patient  reports that he has quit smoking. He has never used smokeless tobacco. He reports that he drinks about 1.8 oz of alcohol per week. He reports that he does not use illicit drugs.   Family History:  The patient's family history includes Diabetes type II in his mother; Heart attack in his mother; Heart disease in his father  and mother; Hypertension in his father and mother; Stroke in his brother, brother, and father.  ROS:   Review of Systems  Constitutional: Positive for malaise/fatigue. Negative for fever, chills, weight loss and diaphoresis.  HENT: Negative for congestion.   Eyes: Negative for discharge and redness.  Respiratory: Negative for cough, hemoptysis, sputum production, shortness of breath and wheezing.   Cardiovascular: Negative for chest pain, palpitations, orthopnea, claudication, leg swelling and PND.  Gastrointestinal: Negative for nausea, vomiting, abdominal pain, blood in stool and melena.  Genitourinary: Negative for hematuria.  Musculoskeletal: Negative for myalgias and falls.  Skin: Negative for rash.  Neurological: Positive for weakness. Negative for dizziness, sensory change, speech change, focal weakness and loss of consciousness.  Endo/Heme/Allergies: Does not bruise/bleed easily.  Psychiatric/Behavioral: Negative for substance abuse. The patient is  not nervous/anxious.   All other systems reviewed and are negative.    PHYSICAL EXAM:  VS:  BP 114/60 mmHg  Pulse 77  Ht  (1.803 m)  Wt 202 lb (91.627 kg)  BMI 28.19 kg/m2 BMI: Body mass index is 28.19 kg/(m^2). Well nourished, well developed, in no acute distress HEENT: normocephalic, atraumatic Neck: no JVD, carotid bruits or masses Cardiac: irregular, S1, S2; RRR; no murmurs, rubs, or gallops Lungs:  clear to auscultation bilaterally, no wheezing, rhonchi or rales Abd: soft, nontender, no hepatomegaly, + BS MS: no deformity or atrophy Ext: trace pre-tibial edema bilaterally Skin: warm and dry, no rash Neuro:  moves all extremities spontaneously, no focal abnormalities noted, follows commands Psych: euthymic mood, full affect   EKG:  Was ordered today. Shows NSR, 77 bpm, PACs, no significant st/t changes   Recent Labs: 04/17/2015: Magnesium 2.0 07/31/2015: ALT 17; B Natriuretic Peptide 574.0* 08/03/2015: BUN 10;  Creatinine, Ser 0.78; Potassium 3.7; Sodium 132* 08/07/2015: Hemoglobin 7.8* 08/16/2015: Platelets 432*  No results found for requested labs within last 365 days.   CrCl cannot be calculated (Patient has no serum creatinine result on file.).   Wt Readings from Last 3 Encounters:  01/03/16 202 lb (91.627 kg)  09/25/15 199 lb 8 oz (90.493 kg)  08/23/15 198 lb (89.812 kg)     Other studies reviewed: Additional studies/records reviewed today include: summarized above  ASSESSMENT AND PLAN:  1. Fatigue: -Check CBC to evaluate for stable HGB/HCT -He is having more frequent PACs, schedule 24 hour Holter to quantify  -Check echo to evaluate aortic valve and right-sided pressure  2. CAD medically managed: -No angina -On Coumadin in place of aspirin  -Continue current medications  3.         Pulmonary HTN: -Check echo as above -Currently taking Lasix 3 times weekly  4.         Lower extremity edema: -Improved now that he is no longer taking amlodipine  5.         Aortic stenosis s/p metallic aortic valve replacement: -Check echo as above  6.          HTN: -Well controlled -Continue current medications  Disposition: F/u with Dr. Mariah Milling in 2-3 weeks  Current medicines are reviewed at length with the patient today.  The patient did not have any concerns regarding medicines.  Elinor Dodge PA-C 01/03/2016 2:48 PM     [facility] [address] [place], [place] [address] [phone]

## 2016-01-04 LAB — CBC WITH DIFFERENTIAL/PLATELET
Basophils Absolute: 0 10*3/uL (ref 0.0–0.2)
Basos: 0 %
EOS (ABSOLUTE): 0.2 10*3/uL (ref 0.0–0.4)
EOS: 2 %
Hematocrit: 42.3 % (ref 37.5–51.0)
Hemoglobin: 14.2 g/dL (ref 12.6–17.7)
IMMATURE GRANULOCYTES: 0 %
Immature Grans (Abs): 0 10*3/uL (ref 0.0–0.1)
Lymphocytes Absolute: 1.1 10*3/uL (ref 0.7–3.1)
Lymphs: 17 %
MCH: 31.1 pg (ref 26.6–33.0)
MCHC: 33.6 g/dL (ref 31.5–35.7)
MCV: 93 fL (ref 79–97)
MONOS ABS: 0.5 10*3/uL (ref 0.1–0.9)
Monocytes: 8 %
NEUTROS PCT: 73 %
Neutrophils Absolute: 4.7 10*3/uL (ref 1.4–7.0)
PLATELETS: 222 10*3/uL (ref 150–379)
RBC: 4.56 x10E6/uL (ref 4.14–5.80)
RDW: 14.7 % (ref 12.3–15.4)
WBC: 6.5 10*3/uL (ref 3.4–10.8)

## 2016-01-04 LAB — BASIC METABOLIC PANEL
BUN/Creatinine Ratio: 19 (ref 10–22)
BUN: 21 mg/dL (ref 8–27)
CALCIUM: 9.4 mg/dL (ref 8.6–10.2)
CHLORIDE: 94 mmol/L — AB (ref 96–106)
CO2: 21 mmol/L (ref 18–29)
CREATININE: 1.12 mg/dL (ref 0.76–1.27)
GFR calc non Af Amer: 61 mL/min/{1.73_m2} (ref >59)
GFR, EST AFRICAN AMERICAN: 70 mL/min/{1.73_m2} (ref >59)
Glucose: 88 mg/dL (ref 65–99)
Potassium: 4.8 mmol/L (ref 3.5–5.2)
Sodium: 134 mmol/L (ref 134–144)

## 2016-01-15 ENCOUNTER — Encounter: Payer: Self-pay | Admitting: Neurology

## 2016-01-15 ENCOUNTER — Ambulatory Visit (INDEPENDENT_AMBULATORY_CARE_PROVIDER_SITE_OTHER): Payer: Medicare Other | Admitting: Neurology

## 2016-01-15 VITALS — BP 141/82 | HR 74 | Ht 71.0 in | Wt 200.5 lb

## 2016-01-15 DIAGNOSIS — G2581 Restless legs syndrome: Secondary | ICD-10-CM | POA: Diagnosis not present

## 2016-01-15 NOTE — Patient Instructions (Signed)
Restless Legs Syndrome Restless legs syndrome is a condition that causes uncomfortable feelings or sensations in the legs, especially while sitting or lying down. The sensations usually cause an overwhelming urge to move the legs. The arms can also sometimes be affected. The condition can range from mild to severe. The symptoms often interfere with a person's ability to sleep. CAUSES The cause of this condition is not known. RISK FACTORS This condition is more likely to develop in:  People who are older than age 50.  Pregnant women. In general, restless legs syndrome is more common in women than in men.  People who have a family history of the condition.  People who have certain medical conditions, such as iron deficiency, kidney disease, Parkinson disease, or nerve damage.  People who take certain medicines, such as medicines for high blood pressure, nausea, colds, allergies, depression, and some heart conditions. SYMPTOMS The main symptom of this condition is uncomfortable sensations in the legs. These sensations may be:  Described as pulling, tingling, prickling, throbbing, crawling, or burning.  Worse while you are sitting or lying down.  Worse during periods of rest or inactivity.  Worse at night, often interfering with your sleep.  Accompanied by a very strong urge to move your legs.  Temporarily relieved by movement of your legs. The sensations usually affect both sides of the body. The arms can also be affected, but this is rare. People who have this condition often have tiredness during the day because of their lack of sleep at night. DIAGNOSIS This condition may be diagnosed based on your description of the symptoms. You may also have tests, including blood tests, to check for other conditions that may lead to your symptoms. In some cases, you may be asked to spend some time in a sleep lab so your sleeping can be monitored. TREATMENT Treatment for this condition is  focused on managing the symptoms. Treatment may include:  Self-help and lifestyle changes.  Medicines. HOME CARE INSTRUCTIONS  Take medicines only as directed by your health care provider.  Try these methods to get temporary relief from the uncomfortable sensations:  Massage your legs.  Walk or stretch.  Take a cold or hot bath.  Practice good sleep habits. For example, go to bed and get up at the same time every day.  Exercise regularly.  Practice ways of relaxing, such as yoga or meditation.  Avoid caffeine and alcohol.  Do not use any tobacco products, including cigarettes, chewing tobacco, or electronic cigarettes. If you need help quitting, ask your health care provider.  Keep all follow-up visits as directed by your health care provider. This is important. SEEK MEDICAL CARE IF: Your symptoms do not improve with treatment, or they get worse.   This information is not intended to replace advice given to you by your health care provider. Make sure you discuss any questions you have with your health care provider.   Document Released: 11/01/2002 Document Revised: 03/28/2015 Document Reviewed: 11/07/2014 Elsevier Interactive Patient Education 2016 [employer]  

## 2016-01-15 NOTE — Progress Notes (Signed)
Reason for visit: Restless leg syndrome  [name] is an 82 y.o. male  History of present illness:  [name] is an 82 year old right-handed white male with a history of restless leg syndrome. The patient has had a significant iron deficiency anemia which has been corrected with iron supplementation. He went from a hemoglobin of 7.8 to around 14. He has noted an improvement in his symptoms, he is on Requip taking 2 mg 4 times daily. He is tolerating this medication well. He indicates that most nights he is sleeping fairly well. The denies any nocturnal leg cramps. He comes to this office for an evaluation.  Past Medical History  Diagnosis Date  . GERD (gastroesophageal reflux disease)   . Bradycardia     chronic, no symptoms 07/2010  . Depression   . Anxiety 10/11  . SOB (shortness of breath) 10/11    08/2010,Episodes at 5 AM, eventually felt to be anxiety, after complete workup including catheterization, pt greatly improved with anxiety meds 11/11  . Hypertension     BP higher than usual 04/19/10; amlodipine increased by telephone  . Decreased hearing     Right ear  . Coronary artery disease     a. mild, cath, 08/2010; b. medically managed  . Aortic stenosis     a. s/p mechcanical AVR, 2002  . S/P AVR     a. St. Jude. mechanical 2002; b. echo 08/2010 EF 60%, trival AI, mild MR, AVR working well; c. on longterm warfarin tx  . Carotid bruit     dopplers in past, no abnormalities  . Gastric ulcer   . C. difficile colitis   . RLS (restless legs syndrome) 08/23/2015  . Chronic diastolic CHF (congestive heart failure) (HCC)     a. echo 03/2015: EF 60-65%, no RWMA, GR1DD, aortic valve poorly visualized, unable to excluded vegetation, increased transvalvular velocity, mechanical valve is present, mild biatrial enalrgement, mild to mod MR, mod TR, PASP 65 mm Hg    Past Surgical History  Procedure Laterality Date  . Valve replacement  1/02    Aortic; echo 3/09 valve  working well; echo 10/11 working well; put on Coumadin  . Cardiac catheterization    . Joint replacement    . Hernia repair    . Esophagogastroduodenoscopy N/A 04/05/2015    Procedure: ESOPHAGOGASTRODUODENOSCOPY (EGD);  Surgeon: Scot Jun, MD;  Location: [facility] ENDOSCOPY;  Service: Endoscopy;  Laterality: N/A;  . Esophagogastroduodenoscopy N/A 04/17/2015    Procedure: ESOPHAGOGASTRODUODENOSCOPY (EGD);  Surgeon: Scot Jun, MD;  Location: [facility] ENDOSCOPY;  Service: Endoscopy;  Laterality: N/A;  . Esophagogastroduodenoscopy N/A 08/02/2015    Procedure: ESOPHAGOGASTRODUODENOSCOPY (EGD);  Surgeon: Wallace Cullens, MD;  Location: [facility] ENDOSCOPY;  Service: Endoscopy;  Laterality: N/A;  . Total hip arthroplasty      Family History  Problem Relation Age of Onset  . Hypertension    . Hypertension Mother   . Diabetes type II Mother   . Heart disease Mother   . Heart attack Mother   . Hypertension Father   . Heart disease Father   . Stroke Father   . Stroke Brother   . Stroke Brother     Social history:  reports that he has quit smoking. He has never used smokeless tobacco. He reports that he drinks about 1.8 oz of alcohol per week. He reports that he does not use illicit drugs.    Allergies  Allergen Reactions  . Levofloxacin Nausea Only  . Sulfa  Antibiotics Other (See Comments)    Reaction:  Unknown     Medications:  Prior to Admission medications   Medication Sig Start Date End Date Taking? Authorizing Provider  acetaminophen (TYLENOL) 500 MG tablet Take 500 mg by mouth every 4 (four) hours as needed for mild pain or headache.    Yes Historical Provider, MD  ALPRAZolam Prudy Feeler) 0.5 MG tablet Take 0.5 tablets (0.25 mg total) by mouth daily as needed for anxiety. Patient taking differently: Take 0.5 mg by mouth daily as needed for anxiety.  04/20/15  Yes Auburn Bilberry, MD  citalopram (CELEXA) 20 MG tablet Take 20 mg by mouth daily.   Yes Historical Provider, MD  doxazosin (CARDURA) 8  MG tablet Take 0.5 tablets (4 mg total) by mouth daily. 10/04/15  Yes Antonieta Iba, MD  finasteride (PROSCAR) 5 MG tablet Take 5 mg by mouth daily.     Yes Historical Provider, MD  furosemide (LASIX) 20 MG tablet Take 1 tablet (20 mg total) by mouth daily as needed. 09/25/15  Yes Antonieta Iba, MD  Multiple Vitamin (MULTIVITAMIN WITH MINERALS) TABS tablet Take 1 tablet by mouth daily.   Yes Historical Provider, MD  pantoprazole (PROTONIX) 40 MG tablet Take 1 tablet (40 mg total) by mouth daily. 08/07/15  Yes Altamese Dilling, MD  rOPINIRole (REQUIP) 2 MG tablet Take 1 tablet (2 mg total) by mouth 4 (four) times daily. 10/23/15  Yes York Spaniel, MD  Tamsulosin HCl (FLOMAX) 0.4 MG CAPS Take 0.4 mg by mouth at bedtime.    Yes Historical Provider, MD  traZODone (DESYREL) 50 MG tablet Take 50 mg by mouth at bedtime.   Yes Historical Provider, MD  valsartan (DIOVAN) 320 MG tablet Take 320 mg by mouth daily.     Yes Historical Provider, MD  warfarin (COUMADIN) 5 MG tablet Take as directed by [facility] 12/04/15  Yes Antonieta Iba, MD    ROS:  Out of a complete 14 system review of symptoms, the patient complains only of the following symptoms, and all other reviewed systems are negative.  Decreased activity Hearing loss Leg swelling Restless legs Environmental allergies Joint pain Bruising easily  Blood pressure 141/82, pulse 74, height  (1.803 m), weight 200 lb 8 oz (90.946 kg).  Physical Exam  General: The patient is alert and cooperative at the time of the examination.  Skin: No significant peripheral edema is noted.   Neurologic Exam  Mental status: The patient is alert and oriented x 3 at the time of the examination. The patient has apparent normal recent and remote memory, with an apparently normal attention span and concentration ability.   Cranial nerves: Facial symmetry is present. Speech is normal, no aphasia or dysarthria is noted. Extraocular  movements are full. Visual fields are full.  Motor: The patient has good strength in all 4 extremities.  Sensory examination: Soft touch sensation is symmetric on the face, arms, and legs.  Coordination: The patient has good finger-nose-finger and heel-to-shin bilaterally.  Gait and station: The patient has a normal gait. Tandem gait is unstable. Romberg is negative. No drift is seen.  Reflexes: Deep tendon reflexes are symmetric.   Assessment/Plan:  1. Restless leg syndrome  2. Iron deficiency anemia  The patient has done well on Requip and with correcting the iron deficiency anemia. We well continue the Requip, he will follow-up in about 9 months. He is to contact our office if he is not doing as well as expected.  Marlan Palau MD 01/15/2016 7:14 PM  [facility] [address] [place], [place] [address]  Phone [phone] Fax [phone]

## 2016-01-17 ENCOUNTER — Ambulatory Visit (INDEPENDENT_AMBULATORY_CARE_PROVIDER_SITE_OTHER): Payer: Medicare Other

## 2016-01-17 ENCOUNTER — Other Ambulatory Visit: Payer: Self-pay

## 2016-01-17 DIAGNOSIS — I2510 Atherosclerotic heart disease of native coronary artery without angina pectoris: Secondary | ICD-10-CM

## 2016-01-17 DIAGNOSIS — I10 Essential (primary) hypertension: Secondary | ICD-10-CM | POA: Diagnosis not present

## 2016-01-17 DIAGNOSIS — Z954 Presence of other heart-valve replacement: Secondary | ICD-10-CM | POA: Diagnosis not present

## 2016-01-17 DIAGNOSIS — Z96643 Presence of artificial hip joint, bilateral: Secondary | ICD-10-CM

## 2016-01-17 DIAGNOSIS — I350 Nonrheumatic aortic (valve) stenosis: Secondary | ICD-10-CM

## 2016-01-17 DIAGNOSIS — R002 Palpitations: Secondary | ICD-10-CM

## 2016-01-17 DIAGNOSIS — R5383 Other fatigue: Secondary | ICD-10-CM

## 2016-01-17 DIAGNOSIS — Z952 Presence of prosthetic heart valve: Secondary | ICD-10-CM

## 2016-01-22 ENCOUNTER — Other Ambulatory Visit: Payer: Self-pay | Admitting: Cardiovascular Disease

## 2016-01-22 ENCOUNTER — Inpatient Hospital Stay (HOSPITAL_COMMUNITY)
Admission: RE | Admit: 2016-01-22 | Discharge: 2016-01-22 | Disposition: A | Discharge status: Home / Self Care | Payer: Medicare Other | Source: Ambulatory Visit

## 2016-01-22 ENCOUNTER — Ambulatory Visit
Admission: RE | Admit: 2016-01-22 | Discharge: 2016-01-22 | Disposition: A | Discharge status: Home / Self Care | Payer: Medicare Other | Source: Ambulatory Visit | Attending: Cardiovascular Disease | Admitting: Cardiovascular Disease

## 2016-01-22 DIAGNOSIS — R002 Palpitations: Secondary | ICD-10-CM

## 2016-01-22 DIAGNOSIS — I2510 Atherosclerotic heart disease of native coronary artery without angina pectoris: Secondary | ICD-10-CM | POA: Diagnosis present

## 2016-01-22 DIAGNOSIS — I10 Essential (primary) hypertension: Secondary | ICD-10-CM | POA: Diagnosis present

## 2016-01-22 DIAGNOSIS — Z954 Presence of other heart-valve replacement: Secondary | ICD-10-CM

## 2016-01-22 DIAGNOSIS — Z952 Presence of prosthetic heart valve: Secondary | ICD-10-CM

## 2016-01-22 DIAGNOSIS — R5383 Other fatigue: Secondary | ICD-10-CM

## 2016-01-22 DIAGNOSIS — Z96643 Presence of artificial hip joint, bilateral: Secondary | ICD-10-CM

## 2016-01-22 DIAGNOSIS — I350 Nonrheumatic aortic (valve) stenosis: Secondary | ICD-10-CM

## 2016-01-24 ENCOUNTER — Ambulatory Visit (INDEPENDENT_AMBULATORY_CARE_PROVIDER_SITE_OTHER): Payer: Medicare Other

## 2016-01-24 DIAGNOSIS — Z7901 Long term (current) use of anticoagulants: Secondary | ICD-10-CM

## 2016-01-24 DIAGNOSIS — I359 Nonrheumatic aortic valve disorder, unspecified: Secondary | ICD-10-CM | POA: Diagnosis not present

## 2016-01-24 DIAGNOSIS — Z954 Presence of other heart-valve replacement: Secondary | ICD-10-CM | POA: Diagnosis not present

## 2016-01-24 DIAGNOSIS — I350 Nonrheumatic aortic (valve) stenosis: Secondary | ICD-10-CM | POA: Diagnosis not present

## 2016-01-24 DIAGNOSIS — Z952 Presence of prosthetic heart valve: Secondary | ICD-10-CM

## 2016-01-24 LAB — POCT INR: INR: 2.3

## 2016-02-08 ENCOUNTER — Encounter: Payer: Self-pay | Admitting: Cardiovascular Disease

## 2016-02-08 ENCOUNTER — Ambulatory Visit (INDEPENDENT_AMBULATORY_CARE_PROVIDER_SITE_OTHER): Payer: Medicare Other | Admitting: Cardiovascular Disease

## 2016-02-08 VITALS — BP 136/82 | HR 78 | Ht 71.0 in | Wt 201.5 lb

## 2016-02-08 DIAGNOSIS — Z952 Presence of prosthetic heart valve: Secondary | ICD-10-CM

## 2016-02-08 DIAGNOSIS — I10 Essential (primary) hypertension: Secondary | ICD-10-CM

## 2016-02-08 DIAGNOSIS — Z954 Presence of other heart-valve replacement: Secondary | ICD-10-CM

## 2016-02-08 DIAGNOSIS — I2510 Atherosclerotic heart disease of native coronary artery without angina pectoris: Secondary | ICD-10-CM | POA: Diagnosis not present

## 2016-02-08 NOTE — Assessment & Plan Note (Signed)
Aortic valve well-functioning on recent echocardiogram Repeat echocardiogram in 2 years time

## 2016-02-08 NOTE — Assessment & Plan Note (Signed)
Blood pressure is well controlled on today's visit. No changes made to the medications. 

## 2016-02-08 NOTE — Assessment & Plan Note (Signed)
Mild coronary artery disease of LAD by catheterization 2011 Recommended repeat lipid panel He denies any symptoms concerning for angina

## 2016-02-08 NOTE — Patient Instructions (Signed)
You are doing well. No medication changes were made.  Ask Dr. Dan HumphreysWalker to repeat the cholesterol level  Please call us if you have new issues that need to be addressed before your next appt.  Your physician wants you to follow-up in: 12 months.  You will receive a reminder letter in the mail two months in advance. If you don't receive a letter, please call our office to schedule the follow-up appointment.

## 2016-02-08 NOTE — Progress Notes (Signed)
Patient ID: [name], male    DOB: 1931-09-20, 82 y.o.   MRN: [id number]  HPI Comments: 82 year old male with h/o aortic stenosis s/p St. Jude mechanical aortic valve replacement in 2002 on chronic Coumadin therapy, mild CAD by cardiac cath in 2011 managed medically, GERD, HTN, carotid bruits, history of asymptomatic bradycardia, RLS, anxiety, depression, and osteoarthritis s/p total left hip replacement on 03/21/2015 who was recently discharged from [facility] the above surgery with a hgb of 8 (baseline 12), he was started on po iron replacement therapy, who presented to on 04/02/15 with acute onset of weakness, melena, BRBPR, and was found to have a hgb of 5.1. Aspirin and warfarin initially held, restarted at a later date, S/p EGD He presents today for follow-up of his aortic valve replacement  In follow-up, he reports that he is doing well Reports his leg edema is better by holding amlodipine Takes diuretic every other day Hemoglobin 9 now up to 14  Echocardiogram for Feb 22nd 2017 reviewed with him showing well-functioning aortic valve  Previous cardiac catheterization report from 2011 reviewed with him showing 20% proximal LAD, 40% mid LAD disease  Allergies  Allergen Reactions  . Levofloxacin Nausea Only  . Sulfa Antibiotics Other (See Comments)    Reaction:  Unknown     Current Outpatient Prescriptions on File Prior to Visit  Medication Sig Dispense Refill  . acetaminophen (TYLENOL) 500 MG tablet Take 500 mg by mouth every 4 (four) hours as needed for mild pain or headache.     . ALPRAZolam (XANAX) 0.5 MG tablet Take 0.5 tablets (0.25 mg total) by mouth daily as needed for anxiety. (Patient taking differently: Take 0.5 mg by mouth daily as needed for anxiety. ) 10 tablet 0  . citalopram (CELEXA) 20 MG tablet Take 20 mg by mouth daily.    Marland Kitchen. doxazosin (CARDURA) 8 MG tablet Take 0.5 tablets (4 mg total) by mouth daily. 30 tablet 6  . finasteride (PROSCAR) 5 MG tablet Take 5  mg by mouth daily.      . furosemide (LASIX) 20 MG tablet Take 1 tablet (20 mg total) by mouth daily as needed. 30 tablet 6  . Multiple Vitamin (MULTIVITAMIN WITH MINERALS) TABS tablet Take 1 tablet by mouth daily.    . pantoprazole (PROTONIX) 40 MG tablet Take 1 tablet (40 mg total) by mouth daily. 30 tablet 2  . rOPINIRole (REQUIP) 2 MG tablet Take 1 tablet (2 mg total) by mouth 4 (four) times daily. 360 tablet 1  . Tamsulosin HCl (FLOMAX) 0.4 MG CAPS Take 0.4 mg by mouth at bedtime.     . traZODone (DESYREL) 50 MG tablet Take 50 mg by mouth at bedtime.    . valsartan (DIOVAN) 320 MG tablet Take 320 mg by mouth daily.      Marland Kitchen. warfarin (COUMADIN) 5 MG tablet Take as directed by [facility] 150 tablet 1   No current facility-administered medications on file prior to visit.    Past Medical History  Diagnosis Date  . GERD (gastroesophageal reflux disease)   . Bradycardia     chronic, no symptoms 07/2010  . Depression   . Anxiety 10/11  . SOB (shortness of breath) 10/11    08/2010,Episodes at 5 AM, eventually felt to be anxiety, after complete workup including catheterization, pt greatly improved with anxiety meds 11/11  . Hypertension     BP higher than usual 04/19/10; amlodipine increased by telephone  . Decreased hearing  Right ear  . Coronary artery disease     a. mild, cath, 08/2010; b. medically managed  . Aortic stenosis     a. s/p mechcanical AVR, 2002  . S/P AVR     a. St. Jude. mechanical 2002; b. echo 08/2010 EF 60%, trival AI, mild MR, AVR working well; c. on longterm warfarin tx  . Carotid bruit     dopplers in past, no abnormalities  . Gastric ulcer   . C. difficile colitis   . RLS (restless legs syndrome) 08/23/2015  . Chronic diastolic CHF (congestive heart failure) (HCC)     a. echo 03/2015: EF 60-65%, no RWMA, GR1DD, aortic valve poorly visualized, unable to excluded vegetation, increased transvalvular velocity, mechanical valve is present, mild biatrial  enalrgement, mild to mod MR, mod TR, PASP 65 mm Hg    Past Surgical History  Procedure Laterality Date  . Valve replacement  1/02    Aortic; echo 3/09 valve working well; echo 10/11 working well; put on Coumadin  . Cardiac catheterization    . Joint replacement    . Hernia repair    . Esophagogastroduodenoscopy N/A 04/05/2015    Procedure: ESOPHAGOGASTRODUODENOSCOPY (EGD);  Surgeon: Scot Jun, MD;  Location: [facility] ENDOSCOPY;  Service: Endoscopy;  Laterality: N/A;  . Esophagogastroduodenoscopy N/A 04/17/2015    Procedure: ESOPHAGOGASTRODUODENOSCOPY (EGD);  Surgeon: Scot Jun, MD;  Location: [facility] ENDOSCOPY;  Service: Endoscopy;  Laterality: N/A;  . Esophagogastroduodenoscopy N/A 08/02/2015    Procedure: ESOPHAGOGASTRODUODENOSCOPY (EGD);  Surgeon: Wallace Cullens, MD;  Location: [facility] ENDOSCOPY;  Service: Endoscopy;  Laterality: N/A;  . Total hip arthroplasty      Social History  reports that he has quit smoking. He has never used smokeless tobacco. He reports that he drinks about 1.8 oz of alcohol per week. He reports that he does not use illicit drugs.  Family History family history includes Diabetes type II in his mother; Heart attack in his mother; Heart disease in his father and mother; Hypertension in his father and mother; Stroke in his brother, brother, and father.   Review of Systems  Constitutional: Negative.   Respiratory: Negative.   Cardiovascular: Negative.   Gastrointestinal: Negative.   Musculoskeletal: Negative.   Neurological: Negative.   Hematological: Negative.   Psychiatric/Behavioral: Negative.   All other systems reviewed and are negative.   BP 136/82 mmHg  Pulse 78  Ht  (1.803 m)  Wt 201 lb 8 oz (91.4 kg)  BMI 28.12 kg/m2   Physical Exam  Constitutional: He is oriented to person, place, and time. He appears well-developed and well-nourished.  HENT:  Head: Normocephalic.  Nose: Nose normal.  Mouth/Throat: Oropharynx is clear and moist.   Eyes: Conjunctivae are normal. Pupils are equal, round, and reactive to light.  Neck: Normal range of motion. Neck supple. No JVD present.  Cardiovascular: Normal rate, regular rhythm and intact distal pulses.  Exam reveals no gallop and no friction rub.   Murmur heard.  Systolic murmur is present with a grade of 2/6  Trace edema above the sock line  Pulmonary/Chest: Effort normal and breath sounds normal. No respiratory distress. He has no wheezes. He has no rales. He exhibits no tenderness.  Abdominal: Soft. Bowel sounds are normal. He exhibits no distension. There is no tenderness.  Musculoskeletal: Normal range of motion. He exhibits edema. He exhibits no tenderness.  Lymphadenopathy:    He has no cervical adenopathy.  Neurological: He is alert and oriented to person, place, and time.  Coordination normal.  Skin: Skin is warm and dry. No rash noted. No erythema.  Psychiatric: He has a normal mood and affect. His behavior is normal. Judgment and thought content normal.

## 2016-02-21 ENCOUNTER — Telehealth: Payer: Self-pay | Admitting: *Deleted

## 2016-02-21 ENCOUNTER — Ambulatory Visit (INDEPENDENT_AMBULATORY_CARE_PROVIDER_SITE_OTHER): Payer: Medicare Other

## 2016-02-21 DIAGNOSIS — Z954 Presence of other heart-valve replacement: Secondary | ICD-10-CM

## 2016-02-21 DIAGNOSIS — I350 Nonrheumatic aortic (valve) stenosis: Secondary | ICD-10-CM

## 2016-02-21 DIAGNOSIS — Z952 Presence of prosthetic heart valve: Secondary | ICD-10-CM

## 2016-02-21 DIAGNOSIS — I359 Nonrheumatic aortic valve disorder, unspecified: Secondary | ICD-10-CM

## 2016-02-21 DIAGNOSIS — Z7901 Long term (current) use of anticoagulants: Secondary | ICD-10-CM

## 2016-02-21 LAB — POCT INR: INR: 2.7

## 2016-02-21 MED ORDER — METOPROLOL SUCCINATE ER 25 MG PO TB24: 25.0000 mg | ORAL_TABLET | Freq: Every evening | ORAL | Status: DC

## 2016-02-21 NOTE — Telephone Encounter (Signed)
-----   Message from Antonieta Ibaimothy J Gollan, MD sent at 02/20/2016  5:56 PM EDT ----- Holter monitor showing tremendous amount of ectopy, APCs Would consider starting metoprolol succinate 25 mg daily, take at dinnertime or nighttime This may help suppress some of the arrhythmia

## 2016-02-21 NOTE — Patient Instructions (Addendum)
Patient was in today to have coumadin level checked and requested to have his blood pressure taken. Also reviewed results of holter monitor and ordered prescription per Dr. Mariah MillingGollan. Patient verbalized understanding of all instructions and let him know to call back if any questions. Prescription sent in to patients pharmacy request.

## 2016-02-21 NOTE — Telephone Encounter (Signed)
Patient was in today to have coumadin level checked. He requested to have his blood pressure checked and reviewed results of holter monitor and prescription called in to his pharmacy of choice. He verbalized understanding of all instructions and had no further questions at this time. Let him know to call back in for any further questions.

## 2016-03-06 ENCOUNTER — Telehealth: Payer: Self-pay | Admitting: Cardiovascular Disease

## 2016-03-06 NOTE — Telephone Encounter (Signed)
Left message for pt to call back  °

## 2016-03-06 NOTE — Telephone Encounter (Signed)
Pt c/o BP issue: STAT if pt c/o blurred vision, one-sided weakness or slurred speech  1. What are your last 5 BP readings? Today:  200 /94    195/91   189/91   2. Are you having any other symptoms (ex. Dizziness, headache, blurred vision, passed out)? Dizzy weak   3. What is your BP issue?  Wants to know if he should take amplodipine   Please call to tell patient what to do

## 2016-03-06 NOTE — Telephone Encounter (Signed)
Spoke w/ pt.  He reports that he was out walking his dog when he began to feel very weak and dizzy. He checked his BP, found it to be 200/94, 195/91 and 189/91. He had the nurse check it :177/62, HR 65. Pt would like to know if he should resume amlodipine.  This was stopped 2/2 leg swelling.  Advised him not to resume this. Pt denies any additional stressors, does not feel anxious. Denies any pain other than a HA earlier today.  He takes lasix 3 days per week, does not weigh himself daily. States that he does not feel that he is retaining any fluid.  Advised pt to monitor his BP at home and call back in a few days w/ readings if they remain elevated.

## 2016-03-13 ENCOUNTER — Other Ambulatory Visit: Payer: Self-pay

## 2016-03-13 MED ORDER — ROPINIROLE HCL 2 MG PO TABS: 2.0000 mg | ORAL_TABLET | Freq: Four times a day (QID) | ORAL | Status: DC

## 2016-03-13 NOTE — Telephone Encounter (Signed)
Refills sent in to mail order pharmacy.

## 2016-04-03 ENCOUNTER — Ambulatory Visit (INDEPENDENT_AMBULATORY_CARE_PROVIDER_SITE_OTHER): Payer: Medicare Other

## 2016-04-03 DIAGNOSIS — I350 Nonrheumatic aortic (valve) stenosis: Secondary | ICD-10-CM

## 2016-04-03 DIAGNOSIS — I359 Nonrheumatic aortic valve disorder, unspecified: Secondary | ICD-10-CM

## 2016-04-03 DIAGNOSIS — Z954 Presence of other heart-valve replacement: Secondary | ICD-10-CM

## 2016-04-03 DIAGNOSIS — Z952 Presence of prosthetic heart valve: Secondary | ICD-10-CM

## 2016-04-03 DIAGNOSIS — Z7901 Long term (current) use of anticoagulants: Secondary | ICD-10-CM | POA: Diagnosis not present

## 2016-04-03 LAB — POCT INR: INR: 2.7

## 2016-05-15 ENCOUNTER — Ambulatory Visit (INDEPENDENT_AMBULATORY_CARE_PROVIDER_SITE_OTHER): Payer: Medicare Other

## 2016-05-15 DIAGNOSIS — I359 Nonrheumatic aortic valve disorder, unspecified: Secondary | ICD-10-CM | POA: Diagnosis not present

## 2016-05-15 DIAGNOSIS — Z954 Presence of other heart-valve replacement: Secondary | ICD-10-CM

## 2016-05-15 DIAGNOSIS — Z7901 Long term (current) use of anticoagulants: Secondary | ICD-10-CM | POA: Diagnosis not present

## 2016-05-15 DIAGNOSIS — Z952 Presence of prosthetic heart valve: Secondary | ICD-10-CM

## 2016-05-15 DIAGNOSIS — I350 Nonrheumatic aortic (valve) stenosis: Secondary | ICD-10-CM

## 2016-05-15 LAB — POCT INR: INR: 2.3

## 2016-06-26 ENCOUNTER — Ambulatory Visit (INDEPENDENT_AMBULATORY_CARE_PROVIDER_SITE_OTHER): Payer: Medicare Other

## 2016-06-26 DIAGNOSIS — Z7901 Long term (current) use of anticoagulants: Secondary | ICD-10-CM

## 2016-06-26 DIAGNOSIS — I350 Nonrheumatic aortic (valve) stenosis: Secondary | ICD-10-CM

## 2016-06-26 DIAGNOSIS — Z954 Presence of other heart-valve replacement: Secondary | ICD-10-CM | POA: Diagnosis not present

## 2016-06-26 DIAGNOSIS — I359 Nonrheumatic aortic valve disorder, unspecified: Secondary | ICD-10-CM | POA: Diagnosis not present

## 2016-06-26 DIAGNOSIS — Z952 Presence of prosthetic heart valve: Secondary | ICD-10-CM

## 2016-06-26 LAB — POCT INR: INR: 2.3

## 2016-07-09 ENCOUNTER — Other Ambulatory Visit: Payer: Self-pay

## 2016-07-09 MED ORDER — DOXAZOSIN MESYLATE 8 MG PO TABS: 4.0000 mg | ORAL_TABLET | Freq: Every day | ORAL | 3 refills | Status: DC

## 2016-07-09 MED ORDER — WARFARIN SODIUM 5 MG PO TABS: ORAL_TABLET | ORAL | 1 refills | Status: DC

## 2016-07-15 ENCOUNTER — Other Ambulatory Visit: Payer: Self-pay | Admitting: Neurology

## 2016-08-07 ENCOUNTER — Ambulatory Visit (INDEPENDENT_AMBULATORY_CARE_PROVIDER_SITE_OTHER): Payer: Medicare Other | Admitting: *Deleted

## 2016-08-07 DIAGNOSIS — I359 Nonrheumatic aortic valve disorder, unspecified: Secondary | ICD-10-CM | POA: Diagnosis not present

## 2016-08-07 DIAGNOSIS — I350 Nonrheumatic aortic (valve) stenosis: Secondary | ICD-10-CM

## 2016-08-07 DIAGNOSIS — Z7901 Long term (current) use of anticoagulants: Secondary | ICD-10-CM | POA: Diagnosis not present

## 2016-08-07 DIAGNOSIS — Z952 Presence of prosthetic heart valve: Secondary | ICD-10-CM

## 2016-08-07 DIAGNOSIS — Z954 Presence of other heart-valve replacement: Secondary | ICD-10-CM

## 2016-08-07 LAB — POCT INR: INR: 2.3

## 2016-09-25 ENCOUNTER — Ambulatory Visit (INDEPENDENT_AMBULATORY_CARE_PROVIDER_SITE_OTHER): Payer: Medicare Other

## 2016-09-25 DIAGNOSIS — Z7901 Long term (current) use of anticoagulants: Secondary | ICD-10-CM | POA: Diagnosis not present

## 2016-09-25 DIAGNOSIS — I350 Nonrheumatic aortic (valve) stenosis: Secondary | ICD-10-CM

## 2016-09-25 DIAGNOSIS — Z952 Presence of prosthetic heart valve: Secondary | ICD-10-CM

## 2016-09-25 DIAGNOSIS — I359 Nonrheumatic aortic valve disorder, unspecified: Secondary | ICD-10-CM | POA: Diagnosis not present

## 2016-09-25 LAB — POCT INR: INR: 1.8

## 2016-10-14 ENCOUNTER — Encounter: Payer: Self-pay | Admitting: Adult Health

## 2016-10-14 ENCOUNTER — Other Ambulatory Visit: Payer: Self-pay | Admitting: Cardiovascular Disease

## 2016-10-14 ENCOUNTER — Ambulatory Visit (INDEPENDENT_AMBULATORY_CARE_PROVIDER_SITE_OTHER): Payer: Medicare Other | Admitting: Adult Health

## 2016-10-14 ENCOUNTER — Telehealth: Payer: Self-pay | Admitting: Cardiovascular Disease

## 2016-10-14 VITALS — BP 205/81 | HR 60 | Wt 207.0 lb

## 2016-10-14 DIAGNOSIS — G2581 Restless legs syndrome: Secondary | ICD-10-CM | POA: Diagnosis not present

## 2016-10-14 MED ORDER — ROPINIROLE HCL 2 MG PO TABS: ORAL_TABLET | ORAL | 0 refills | Status: DC

## 2016-10-14 MED ORDER — ROPINIROLE HCL 2 MG PO TABS: ORAL_TABLET | ORAL | 3 refills | Status: DC

## 2016-10-14 NOTE — Patient Instructions (Signed)
Continue Requip. If your symptoms worsen or you develop new symptoms please let us know.   

## 2016-10-14 NOTE — Telephone Encounter (Signed)
Pt c/o BP issue: STAT if pt c/o blurred vision, one-sided weakness or slurred speech  1. What are your last 5 BP readings?  This morning 10:30 205/85- at neurologist 2:48 pm today- 236/85, HR 63  2. Are you having any other symptoms (ex. Dizziness, headache, blurred vision, passed out)? no  3. What is your BP issue?elevated

## 2016-10-14 NOTE — Progress Notes (Signed)
I have read the note, and I agree with the clinical assessment and plan.  Freyja Govea KEITH   

## 2016-10-14 NOTE — Telephone Encounter (Addendum)
Left message on pt's mobile & home #s for pt to call back.

## 2016-10-14 NOTE — Progress Notes (Signed)
PATIENT: [name] DOB: 06-27-1931  REASON FOR VISIT: follow up- restless leg syndrome HISTORY FROM: patient  HISTORY OF PRESENT ILLNESS: [name] is an 82 year old male with a history of restless leg syndrome. He returns today for follow-up. He is currently on Requip 2 mg 4 times a day. He reports that this works well for his symptoms. He states he is only taken the medication at 9 in the morning, 3 PM, 9 PM and again at 3 AM. He states that he has no trouble going to sleep however he typically wakes up around 3 AM with symptoms. In the past he has had iron deficiency anemia which was corrected. His blood pressure is slightly elevated today. He reports that his community center nurse also has been reporting that his blood pressure has been elevated He plans to call his cardiologist to let them know. He returns today for an evaluation.   HISTORY 2/820/17: [name] is an 82 year old right-handed white male with a history of restless leg syndrome. The patient has had a significant iron deficiency anemia which has been corrected with iron supplementation. He went from a hemoglobin of 7.8 to around 14. He has noted an improvement in his symptoms, he is on Requip taking 2 mg 4 times daily. He is tolerating this medication well. He indicates that most nights he is sleeping fairly well. The denies any nocturnal leg cramps. He comes to this office for an evaluation.   REVIEW OF SYSTEMS: Out of a complete 14 system review of symptoms, the patient complains only of the following symptoms, and all other reviewed systems are negative.  Blurred vision, swelling in length, hearing loss, shortness of breath, joint pain  ALLERGIES: Allergies  Allergen Reactions  . Levofloxacin Nausea Only  . Sulfa Antibiotics Other (See Comments)    Reaction:  Unknown     HOME MEDICATIONS: Outpatient Medications Prior to Visit  Medication Sig Dispense Refill  . acetaminophen (TYLENOL) 500 MG tablet  Take 500 mg by mouth every 4 (four) hours as needed for mild pain or headache.     . ALPRAZolam (XANAX) 0.5 MG tablet Take 0.5 tablets (0.25 mg total) by mouth daily as needed for anxiety. (Patient taking differently: Take 0.5 mg by mouth daily as needed for anxiety. ) 10 tablet 0  . citalopram (CELEXA) 20 MG tablet Take 20 mg by mouth daily.    Marland Kitchen. doxazosin (CARDURA) 8 MG tablet TAKE ONE-HALF TABLET BY  MOUTH DAILY 45 tablet 4  . finasteride (PROSCAR) 5 MG tablet Take 5 mg by mouth daily.      . furosemide (LASIX) 20 MG tablet Take 1 tablet (20 mg total) by mouth daily as needed. 30 tablet 6  . metoprolol succinate (TOPROL XL) 25 MG 24 hr tablet Take 1 tablet (25 mg total) by mouth every evening. 30 tablet 11  . Multiple Vitamin (MULTIVITAMIN WITH MINERALS) TABS tablet Take 1 tablet by mouth daily.    . pantoprazole (PROTONIX) 40 MG tablet Take 1 tablet (40 mg total) by mouth daily. 30 tablet 2  . rOPINIRole (REQUIP) 2 MG tablet Take 1 tablet by mouth 4  times daily 360 tablet 0  . Tamsulosin HCl (FLOMAX) 0.4 MG CAPS Take 0.4 mg by mouth at bedtime.     . traZODone (DESYREL) 50 MG tablet Take 50 mg by mouth at bedtime.    . valsartan (DIOVAN) 320 MG tablet Take 320 mg by mouth daily.      Marland Kitchen. warfarin (  COUMADIN) 5 MG tablet Take as directed by [facility] 150 tablet 1   No facility-administered medications prior to visit.     PAST MEDICAL HISTORY: Past Medical History:  Diagnosis Date  . Anxiety 10/11  . Aortic stenosis    a. s/p mechcanical AVR, 2002  . Bradycardia    chronic, no symptoms 07/2010  . C. difficile colitis   . Carotid bruit    dopplers in past, no abnormalities  . Chronic diastolic CHF (congestive heart failure) (HCC)    a. echo 03/2015: EF 60-65%, no RWMA, GR1DD, aortic valve poorly visualized, unable to excluded vegetation, increased transvalvular velocity, mechanical valve is present, mild biatrial enalrgement, mild to mod MR, mod TR, PASP 65 mm Hg  . Coronary artery  disease    a. mild, cath, 08/2010; b. medically managed  . Decreased hearing    Right ear  . Depression   . Gastric ulcer   . GERD (gastroesophageal reflux disease)   . Hypertension    BP higher than usual 04/19/10; amlodipine increased by telephone  . RLS (restless legs syndrome) 08/23/2015  . S/P AVR    a. St. Jude. mechanical 2002; b. echo 08/2010 EF 60%, trival AI, mild MR, AVR working well; c. on longterm warfarin tx  . SOB (shortness of breath) 10/11   08/2010,Episodes at 5 AM, eventually felt to be anxiety, after complete workup including catheterization, pt greatly improved with anxiety meds 11/11    PAST SURGICAL HISTORY: Past Surgical History:  Procedure Laterality Date  . CARDIAC CATHETERIZATION    . ESOPHAGOGASTRODUODENOSCOPY N/A 04/05/2015   Procedure: ESOPHAGOGASTRODUODENOSCOPY (EGD);  Surgeon: [name], MD;  [facility] END[facility];  Service: Endoscopy;  Laterality: N/A;  . ESOPHAGOGASTRODUODENOSCOPY N/A 04/17/2015   Procedure: ESOPHAGOGASTRODUODENOSCOPY (EGD);  Surgeon: [name], MD;  [facility];  Service: Endoscopy;  Laterality: N/A;  . ESOPHAGOGASTRODUODENOSCOPY N/A 08/02/2015   Procedure: ESOPHAGOGASTRODUODENOSCOPY (EGD);  Surgeon: [name], MD;  [facility];  Service: Endoscopy;  Laterality: N/A;  . HERNIA REPAIR    . JOINT REPLACEMENT    . TOTAL HIP ARTHROPLASTY    . VALVE REPLACEMENT  1/02   Aortic; echo 3/09 valve working well; echo 10/11 working well; put on Coumadin    FAMILY HISTORY: Family History  Problem Relation Age of Onset  . Hypertension Mother   . Diabetes type II Mother   . Heart disease Mother   . Heart attack Mother   . Hypertension Father   . Heart disease Father   . Stroke Father   . Stroke Brother   . Stroke Brother   . Hypertension      SOCIAL HISTORY: Social History   Social History  . Marital status: Married    Spouse name: N/A  . Number of children: 3  . Years of education: some  coll.   Occupational History  . Retired Retired   Social History Main Topics  . Smoking status: Former Games developermoker  . Smokeless tobacco: Never Used  . Alcohol use 1.8 oz/week    3 Glasses of wine per week     Comment: Social  . Drug use: No  . Sexual activity: Not on file   Other Topics Concern  . Not on file   Social History Narrative   No regular exercise.   [facility] His wife lives in same facility but she lives in the assisted living part.      Patient drinks 1-2 cups of caffeine daily.  Patient is right handed.         PHYSICAL EXAM  Vitals:   10/14/16 1028  BP: (!) 205/81  Pulse: 60  Weight: 207 lb (93.9 kg)   Body mass index is 28.87 kg/m.  Generalized: Well developed, in no acute distress   Neurological examination  Mentation: Alert oriented to time, place, history taking. Follows all commands speech and language fluent Cranial nerve II-XII: Pupils were equal round reactive to light. Extraocular movements were full, visual field were full on confrontational test. Facial sensation and strength were normal. Uvula tongue midline. Head turning and shoulder shrug  were normal and symmetric. Motor: The motor testing reveals 5 over 5 strength of all 4 extremities. Good symmetric motor tone is noted throughout.  Sensory: Sensory testing is intact to soft touch on all 4 extremities. No evidence of extinction is noted.  Coordination: Cerebellar testing reveals good finger-nose-finger and heel-to-shin bilaterally.  Gait and station: Gait is normal. Tandem gait not attempted. Romberg is negative. No drift is seen.  Reflexes: Deep tendon reflexes are symmetric and normal bilaterally.   DIAGNOSTIC DATA (LABS, IMAGING, TESTING) - I reviewed patient records, labs, notes, testing and imaging myself where available.  Lab Results  Component Value Date   WBC 6.5 01/03/2016   HGB 7.8 (L) 08/07/2015   HCT 42.3 01/03/2016   MCV 93 01/03/2016   PLT 222  01/03/2016      Component Value Date/Time   NA 134 01/03/2016 1515   NA 129 (L) 03/23/2015 0429   K 4.8 01/03/2016 1515   K 3.7 03/23/2015 0429   CL 94 (L) 01/03/2016 1515   CL 96 (L) 03/23/2015 0429   CO2 21 01/03/2016 1515   CO2 28 03/23/2015 0429   GLUCOSE 88 01/03/2016 1515   GLUCOSE 94 08/03/2015 0420   GLUCOSE 118 (H) 03/23/2015 0429   BUN 21 01/03/2016 1515   BUN 19 03/23/2015 0429   CREATININE 1.12 01/03/2016 1515   CREATININE 0.72 03/23/2015 0429   CALCIUM 9.4 01/03/2016 1515   CALCIUM 7.8 (L) 03/23/2015 0429   PROT 6.8 07/31/2015 0917   PROT 7.2 07/28/2014 1627   ALBUMIN 4.1 07/31/2015 0917   ALBUMIN 4.1 07/28/2014 1627   AST 27 07/31/2015 0917   AST 22 07/28/2014 1627   ALT 17 07/31/2015 0917   ALT 19 07/28/2014 1627   ALKPHOS 82 07/31/2015 0917   ALKPHOS 98 07/28/2014 1627   BILITOT 1.0 07/31/2015 0917   BILITOT 0.8 07/28/2014 1627   GFRNONAA 61 01/03/2016 1515   GFRNONAA >60 03/23/2015 0429   GFRAA 70 01/03/2016 1515   GFRAA >60 03/23/2015 0429   No results found for: CHOL, HDL, LDLCALC, LDLDIRECT, TRIG, CHOLHDL Lab Results  Component Value Date   HGBA1C 5.3 10/31/2014   No results found for: [id number] Lab Results  Component Value Date   TSH 2.50 10/31/2014      ASSESSMENT AND PLAN 82 y.o. year old male  has a past medical history of Anxiety (10/11); Aortic stenosis; Bradycardia; C. difficile colitis; Carotid bruit; Chronic diastolic CHF (congestive heart failure) (HCC); Coronary artery disease; Decreased hearing; Depression; Gastric ulcer; GERD (gastroesophageal reflux disease); Hypertension; RLS (restless legs syndrome) (08/23/2015); S/P AVR; and SOB (shortness of breath) (10/11). here with:  1. Restless leg syndrome  Overall the patient is doing well. He will continue Requip 2 mg 4 times a day. I did advise that instead of taking one tablet at 3 AM he could potentially take 1-1/2 tablets at 9 PM  to see if this abates his symptoms through the  night. Patient verbalized understanding. He will call his cardiologist and update him with his blood pressure readings. Patient advised that if his symptoms worsen or he develops any new symptoms he should let us know. He will follow-up in one year or sooner if needed.     Butch Penny, MSN, NP-C 10/14/2016, 10:34 AM [facility] [address], [address] [place], [place] [address] [phone]

## 2016-10-14 NOTE — Telephone Encounter (Signed)
Spoke w/ pt.  He reports that he has a BP cuff at home, but does not check BP. Elevated readings are from MD offices. He is currently not at home, [facility] but will have nurse check BP while I wait: 212/78, SpO2 87%, HR 62. He reports that he has not taken lasix, his PCP reduced this to once a week due to hyponatremia.  Pt does not weigh daily. Pt sched to see Dr. Mariah MillingGollan tomorrow @ 2:20.   Advised him that I will make Dr. Mariah MillingGollan aware of his readings and call him if he has any recommendations before that time.

## 2016-10-15 ENCOUNTER — Ambulatory Visit (INDEPENDENT_AMBULATORY_CARE_PROVIDER_SITE_OTHER): Payer: Medicare Other | Admitting: Cardiovascular Disease

## 2016-10-15 ENCOUNTER — Encounter: Payer: Self-pay | Admitting: Cardiovascular Disease

## 2016-10-15 VITALS — BP 202/92 | HR 66 | Ht 71.0 in | Wt 202.2 lb

## 2016-10-15 DIAGNOSIS — I10 Essential (primary) hypertension: Secondary | ICD-10-CM | POA: Diagnosis not present

## 2016-10-15 DIAGNOSIS — Z7901 Long term (current) use of anticoagulants: Secondary | ICD-10-CM

## 2016-10-15 DIAGNOSIS — Z952 Presence of prosthetic heart valve: Secondary | ICD-10-CM | POA: Diagnosis not present

## 2016-10-15 DIAGNOSIS — I2510 Atherosclerotic heart disease of native coronary artery without angina pectoris: Secondary | ICD-10-CM | POA: Diagnosis not present

## 2016-10-15 DIAGNOSIS — I350 Nonrheumatic aortic (valve) stenosis: Secondary | ICD-10-CM | POA: Diagnosis not present

## 2016-10-15 DIAGNOSIS — R600 Localized edema: Secondary | ICD-10-CM

## 2016-10-15 MED ORDER — HYDRALAZINE HCL 50 MG PO TABS: 50.0000 mg | ORAL_TABLET | Freq: Three times a day (TID) | ORAL | 3 refills | Status: DC | PRN

## 2016-10-15 NOTE — Progress Notes (Signed)
Cardiology Office Note  Date:  10/15/2016   ID:  [name], DOB 08-16-1931, MRN [id number]  PCP:  [name] BihariWALKER III, [name], MD   Chief Complaint  Patient presents with  . other    F/u due to elevated BP. Pt c/o SOB, dizziness, SOB. Reviewed meds with pt verbally.    HPI:  82 year old male with h/o aortic stenosis s/p [employer] mechanical aortic valve replacement in 2002 on chronic Coumadin therapy, mild CAD by cardiac cath in 2011 managed medically, GERD, HTN, carotid bruits, history of asymptomatic bradycardia, RLS, anxiety, depression, and osteoarthritis s/p total left hip replacement on 03/21/2015 who was recently discharged from [facility] the above surgery with a hgb of 8 (baseline 12), he was started on po iron replacement therapy, who presented to on 04/02/15 with acute onset of weakness, melena, BRBPR, and was found to have a hgb of 5.1. Aspirin and warfarin initially held, restarted at a later date, S/p EGD He presents today for follow-up of his aortic valve replacement And hypertension  Reports he had been doing well but did not feel well this past weekend Felt lightheaded, mild SOB, no energy He had nurses check his blood pressure, Measurements of systolic pressure of 200, repeat systolic pressure 180s Again blood pressure numbers elevated yesterday Parasystolic pressures are elevated on today's visit even on repeat, still 200 systolic Was taking lasix 3 x per week, now once as week Wonders if pressure could be elevated from less Lasix Previously taking Lasix for leg edema which has resolved He was lightheaded this morning Otherwise reports no pain, no stress, no changes in daily routine  EKG on today's visit shows normal sinus rhythm with rate 66 bpm, no significant ST or T-wave changes Other past medical history reviewed 48 hour Holter monitor 12/2015 Normal sinus rhythm Average heart beat 70 bpm, maximum heart rate 114 bpm, minimum 45 bpm (at 3 AM) Frequent APCs,  couplets, bigeminy's with short runs, longest run of 5 beats (12% of all beats), Rare PVC   leg edema is better by holding amlodipine 10 mg Previous drop in Hemoglobin 9 improved up to 14  Previous Echocardiogram for Feb 22nd 2017 reviewed with him showing well-functioning aortic valve  Previous cardiac catheterization report from 2011 reviewed with him showing 20% proximal LAD, 40% mid LAD disease   PMH:   has a past medical history of Anxiety (10/11); Aortic stenosis; Bradycardia; C. difficile colitis; Carotid bruit; Chronic diastolic CHF (congestive heart failure) (HCC); Coronary artery disease; Decreased hearing; Depression; Gastric ulcer; GERD (gastroesophageal reflux disease); Hypertension; RLS (restless legs syndrome) (08/23/2015); S/P AVR; and SOB (shortness of breath) (10/11).  PSH:    Past Surgical History:  Procedure Laterality Date  . CARDIAC CATHETERIZATION    . ESOPHAGOGASTRODUODENOSCOPY N/A 04/05/2015   Procedure: ESOPHAGOGASTRODUODENOSCOPY (EGD);  Surgeon: [name], MD;  [facility];  Service: Endoscopy;  Laterality: N/A;  . ESOPHAGOGASTRODUODENOSCOPY N/A 04/17/2015   Procedure: ESOPHAGOGASTRODUODENOSCOPY (EGD);  Surgeon: [name], MD;  [facility];  Service: Endoscopy;  Laterality: N/A;  . ESOPHAGOGASTRODUODENOSCOPY N/A 08/02/2015   Procedure: ESOPHAGOGASTRODUODENOSCOPY (EGD);  Surgeon: [name], MD;  [facility];  Service: Endoscopy;  Laterality: N/A;  . HERNIA REPAIR    . JOINT REPLACEMENT    . TOTAL HIP ARTHROPLASTY    . VALVE REPLACEMENT  1/02   Aortic; echo 3/09 valve working well; echo 10/11 working well; put on Coumadin    Current Outpatient Prescriptions  Medication Sig Dispense  Refill  . acetaminophen (TYLENOL) 500 MG tablet Take 500 mg by mouth every 4 (four) hours as needed for mild pain or headache.     . ALPRAZolam (XANAX) 0.5 MG tablet Take 0.5 tablets (0.25 mg total) by mouth daily as needed for  anxiety. (Patient taking differently: Take 0.5 mg by mouth daily as needed for anxiety. ) 10 tablet 0  . celecoxib (CELEBREX) 100 MG capsule Take 100 mg by mouth as needed.    . Cetirizine HCl 10 MG CAPS Take 10 mg by mouth daily.    . citalopram (CELEXA) 20 MG tablet Take 20 mg by mouth daily.    Marland Kitchen. doxazosin (CARDURA) 8 MG tablet TAKE ONE-HALF TABLET BY  MOUTH DAILY 45 tablet 4  . finasteride (PROSCAR) 5 MG tablet Take 5 mg by mouth daily.      . furosemide (LASIX) 20 MG tablet Take 1 tablet (20 mg total) by mouth daily as needed. 30 tablet 6  . meloxicam (MOBIC) 7.5 MG tablet Take 7.5 mg by mouth as needed.    . metoprolol succinate (TOPROL XL) 25 MG 24 hr tablet Take 1 tablet (25 mg total) by mouth every evening. 30 tablet 11  . Multiple Vitamin (MULTIVITAMIN WITH MINERALS) TABS tablet Take 1 tablet by mouth daily.    . pantoprazole (PROTONIX) 40 MG tablet Take 1 tablet (40 mg total) by mouth daily. 30 tablet 2  . rOPINIRole (REQUIP) 2 MG tablet Take 1 tablet by mouth 4  times daily 360 tablet 3  . Tamsulosin HCl (FLOMAX) 0.4 MG CAPS Take 0.4 mg by mouth at bedtime.     . traZODone (DESYREL) 50 MG tablet Take 50 mg by mouth at bedtime.    . valsartan (DIOVAN) 320 MG tablet Take 320 mg by mouth daily.      Marland Kitchen. warfarin (COUMADIN) 5 MG tablet Take as directed by [facility] 150 tablet 1  . hydrALAZINE (APRESOLINE) 50 MG tablet Take 1 tablet (50 mg total) by mouth 3 (three) times daily as needed. 90 tablet 3   No current facility-administered medications for this visit.      Allergies:   Levofloxacin and Sulfa antibiotics   Social History:  The patient  reports that he has quit smoking. He has never used smokeless tobacco. He reports that he drinks about 1.8 oz of alcohol per week . He reports that he does not use drugs.   Family History:   family history includes Diabetes type II in his mother; Heart attack in his mother; Heart disease in his father and mother; Hypertension in his father  and mother; Stroke in his brother, brother, and father.    Review of Systems: Review of Systems  Constitutional: Positive for malaise/fatigue.  Respiratory: Negative.   Cardiovascular: Negative.   Gastrointestinal: Negative.   Musculoskeletal: Negative.   Neurological: Positive for dizziness.  Psychiatric/Behavioral: Negative.   All other systems reviewed and are negative.    PHYSICAL EXAM: VS:  BP (!) 202/92 (BP Location: Left Arm, Patient Position: Sitting, Cuff Size: Normal)   Pulse 66   Ht 5\' 11"  (1.803 m)   Wt 202 lb 4 oz (91.7 kg)   BMI 28.21 kg/m  , BMI Body mass index is 28.21 kg/m.  Blood pressure remains elevated even on my recheck left arm GEN: Well nourished, well developed, in no acute distress  HEENT: normal  Neck: no JVD, carotid bruits, or masses Cardiac: RRR; 2+ SEM RSB,  No rubs, or gallops,no edema  Respiratory:  clear to auscultation bilaterally, normal work of breathing GI: soft, nontender, nondistended, + BS MS: no deformity or atrophy  Skin: warm and dry, no rash Neuro:  Strength and sensation are intact Psych: euthymic mood, full affect    Recent Labs: 01/03/2016: BUN 21; Creatinine, Ser 1.12; Platelets 222; Potassium 4.8; Sodium 134    Lipid Panel No results found for: CHOL, HDL, LDLCALC, TRIG    Wt Readings from Last 3 Encounters:  10/15/16 202 lb 4 oz (91.7 kg)  10/14/16 207 lb (93.9 kg)  02/08/16 201 lb 8 oz (91.4 kg)       ASSESSMENT AND PLAN:  Coronary artery disease involving native coronary artery of native heart without angina pectoris - Plan: EKG 12-Lead Currently with no symptoms of angina. No further workup at this time. Continue current medication regimen.  Aortic valve stenosis, etiology of cardiac valve disease unspecified - Plan: EKG 12-Lead Recent echocardiogram earlier in 2017, showed well functioning aortic valve prosthesis  Malignant hypertension Baseline blood pressure typically 130 systolic Persistently  elevated blood pressure 180 up to 200 is well above his baseline Etiology unclear, no precipitating factors or stressors Recommended he take doxazosin 8 mg twice a day, Metoprolol in the evening, valsartan in the morning as he is doing Recommended he take either amlodipine 5 up to 10 mg as needed for persistent hypertension above 160. Prescription for hydralazine 50 mg also given to take for emergency, systolic greater than 160 We will try to avoid amlodipine on a regular basis given previous history of 1+ pitting lower extremity edema -Other options for blood pressure include isosorbide  Warfarin anticoagulation Tolerating anticoagulation, Will work hard to pull blood pressure down  Bilateral leg edema No significant lower extremity edema on today's visit   Total encounter time more than 25 minutes  Greater than 50% was spent in counseling and coordination of care with the patient   Disposition:   F/U  1 month   Orders Placed This Encounter  Procedures  . EKG 12-Lead     Signed, Dossie Arbour, M.D., Ph.D. 10/15/2016  [facility] [place], [place] [phone]

## 2016-10-15 NOTE — Patient Instructions (Addendum)
Medication Instructions:   Please take doxazosin whole pill twice a day  Emergency pill for high blood pressure (>160) Hydralazine  50 mg pill, can be taken every 6 hours  Amlodipine 1/2 pill up to a full pill for emergent (pressure >160)   TONIGHT: Whole doxazosin, metoprolol Hydralazine and or amlodipine if needed  MORNING: Valsartan, doxazosin in the morning Hydralazine and or amlodipine if needed  Labwork:  No new labs needed  Testing/Procedures:  No further testing at this time   I recommend watching educational videos on topics of interest to you at:       [email or web address].[email or web address]  Enter code: HEARTCARE    Follow-Up: It was a pleasure seeing you in the office today. Please call us if you have new issues that need to be addressed before your next appt.  [phone]  Your physician wants you to follow-up in: 1 month    If you need a refill on your cardiac medications before your next appointment, please call your pharmacy.

## 2016-10-16 ENCOUNTER — Telehealth: Payer: Self-pay | Admitting: Cardiovascular Disease

## 2016-10-16 ENCOUNTER — Other Ambulatory Visit: Payer: Self-pay | Admitting: Physician Assistant

## 2016-10-16 MED ORDER — DOXAZOSIN MESYLATE 8 MG PO TABS: 8.0000 mg | ORAL_TABLET | Freq: Every day | ORAL | 4 refills | Status: DC

## 2016-10-16 NOTE — Telephone Encounter (Signed)
Spoke w/ pt.  Advised him that I spoke w Dr. Mariah MillingGollan and he requests that pt have [facility] nurse check his pulses in both wrists and recheck his BP, as he suspects that reading in rt arm is incorrect.  Pt took cardura 8 mg, hydralazine 25 mg, and diovan 320 mg today, but has not taken amlodipine. Advised him that Dr. Mariah MillingGollan recommends he take a whole pill, as his SBP is > 160. Advised him to follow the directions that Dr. Mariah MillingGollan gave him yesterday and to call our office over the weekend if needed and ask for PA/MD on call. Asked him to call me on Monday and let me know how he is doing.  He is agreeable and appreciative of the call.

## 2016-10-16 NOTE — Telephone Encounter (Signed)
Spoke w/ pt.  Pt was advised at yesterday's ov:  "Please take doxazosin whole pill twice a day Emergency pill for high blood pressure (>160) Hydralazine  50 mg pill, can be taken every 6 hours Amlodipine 1/2 pill up to a full pill for emergent (pressure >160) TONIGHT: Whole doxazosin, metoprolol Hydralazine and or amlodipine if needed MORNING: Valsartan, doxazosin in the morning Hydralazine and or amlodipine if needed"  He reports that his BP has been fluctuating. HH nurse checked BP this am: 209/85 in left arm, came down to 175/70 where it has remained, but right arm 109/60. HH nurse advised pt to only take 1/2 hydralazine 50 mg. Pt reports that he feels dizzy and has no energy.' He would like to know if he should continue this regimen for a few more days or if any other changes need to be made. Advised him that I will make Dr. Mariah MillingGollan aware and call him w/ his recommendation.

## 2016-10-16 NOTE — Telephone Encounter (Signed)
°*  STAT* If patient is at the pharmacy, call can be transferred to refill team.   1. Which medications need to be refilled? (please list name of each medication and dose if known)  Doxazosin 8 mg  2. Which pharmacy/location (including street and city if local pharmacy) is medication to be sent to[facility] [address]  3. Do they need a 30 day or 90 day supply?  30   Pt states that he is receiving it through mail order but won't have enough for the increase we asked him to do  Please advise.

## 2016-10-16 NOTE — Telephone Encounter (Signed)
Patient says bp medication have brought bp down a little to fast per conversation he had with  [facility] nurse    Right Arm 110/ ?Something and Left arm 165/75  Please call to discuss

## 2016-10-16 NOTE — Telephone Encounter (Signed)
Pt mentioned he was told to increase his Doxazosin 8 mg tablet to full tablet twice daily. He had Rx sent for Doxazosin 8 mg 1/2 tablet daily sent to his pharmacy 10/14/16. Please advise for pt will not have enough medication for last OV change.

## 2016-10-16 NOTE — Telephone Encounter (Signed)
Please see previous phone note.  

## 2016-10-21 ENCOUNTER — Telehealth: Payer: Self-pay | Admitting: Cardiovascular Disease

## 2016-10-21 MED ORDER — DOXAZOSIN MESYLATE 8 MG PO TABS: 8.0000 mg | ORAL_TABLET | Freq: Every day | ORAL | 3 refills | Status: DC

## 2016-10-21 NOTE — Telephone Encounter (Signed)
Pt calling having two messages for the nurse.   1. We were to send a new prescription to [facility]  Doxazosin, this was never called in he states.  2. He is also giving us an update on the new medication we placed him on He feels he would do better if he took half a tablet (This is on the Hydralazine)   Would like us to please send in the prescription in.

## 2016-10-21 NOTE — Telephone Encounter (Signed)
Please see previous phone note. Updated Cardura rx sent to [facility].

## 2016-10-21 NOTE — Telephone Encounter (Signed)
Spoke w/ pt.  He reports that a whole hydralazine makes him feel weak, he feels better taking 1/2 pill Reports that BP is very high before meds, SBP this am was 205.  After taking all am meds and 1/2 hydralazine, BP a little while ago is 158/64. He reports very high BPs in the am. He would like to know if he should take one of his BP pills at night to cover him. Advised him that I will make Dr. Mariah MillingGollan aware and call him back w/ his recommendation.

## 2016-10-21 NOTE — Telephone Encounter (Signed)
Would take one of the hydralazine's right before bed as well Continue to monitor pressures

## 2016-10-21 NOTE — Telephone Encounter (Signed)
Spoke w/ pt.  Advised him of Dr. Windell HummingbirdGollan's recommendation.  He is appreciative and will call back in a few days w/ an update.

## 2016-10-22 ENCOUNTER — Other Ambulatory Visit: Payer: Self-pay

## 2016-10-22 MED ORDER — DOXAZOSIN MESYLATE 8 MG PO TABS: 8.0000 mg | ORAL_TABLET | Freq: Two times a day (BID) | ORAL | 3 refills | Status: DC

## 2016-10-23 ENCOUNTER — Ambulatory Visit (INDEPENDENT_AMBULATORY_CARE_PROVIDER_SITE_OTHER): Payer: Medicare Other

## 2016-10-23 DIAGNOSIS — Z952 Presence of prosthetic heart valve: Secondary | ICD-10-CM | POA: Diagnosis not present

## 2016-10-23 DIAGNOSIS — I359 Nonrheumatic aortic valve disorder, unspecified: Secondary | ICD-10-CM

## 2016-10-23 DIAGNOSIS — Z7901 Long term (current) use of anticoagulants: Secondary | ICD-10-CM | POA: Diagnosis not present

## 2016-10-23 DIAGNOSIS — I350 Nonrheumatic aortic (valve) stenosis: Secondary | ICD-10-CM

## 2016-10-23 LAB — POCT INR: INR: 2.5

## 2016-10-28 ENCOUNTER — Telehealth: Payer: Self-pay | Admitting: Cardiovascular Disease

## 2016-10-28 NOTE — Telephone Encounter (Signed)
Spoke w/ [name].  He reports that the hydralazine makes him feel so tired and weak, that he does not want to take it anymore. She has only been taking it at night; sx have improved doing this, but he still feels bad in the am. [name] takes amlodipine in the am and reports that his BPs have been running good. BP this am 155/65.  He would like to know if there is any alternative to the hydralazine that won't have the same side effects. Advised him that I will make [name] aware and call him back w/ his recommendation.

## 2016-10-28 NOTE — Telephone Encounter (Signed)
Patient wants to know if there is an alternative to hydralazine . He feels like he has no energy and tired all the time.  Please call.

## 2016-10-30 NOTE — Telephone Encounter (Signed)
Would find out if he is taking Cardura Previously recommended he start this 4 mg twice a day Could increase dose if needed

## 2016-10-31 NOTE — Telephone Encounter (Signed)
Spoke w/ pt.  He reports that he is taking Cardura 8 mg BID.  He feels better since only taking hydralazine at night. He was previously on amlodipine 10 mg daily, but this was d/c'd 2/2 leg swelling. He was advised to take this prn, but he has resumed once daily dosing and his legs are swelling again.  Pt voices frustration and would like to know what can be done for his BP w/ minimal side effects. Advised him that I will make Dr. Mariah MillingGollan aware of his current meds and call him back w/ his recommendation.

## 2016-10-31 NOTE — Telephone Encounter (Signed)
Would start isosorbide 30 mg daily Try to wean down on amlodipine, at least to 5 mg daily then off if BP ok Needs follow up office visit

## 2016-11-01 MED ORDER — ISOSORBIDE MONONITRATE ER 30 MG PO TB24: 30.0000 mg | ORAL_TABLET | Freq: Every day | ORAL | 3 refills | Status: DC

## 2016-11-01 MED ORDER — ISOSORBIDE MONONITRATE ER 30 MG PO TB24: 30.0000 mg | ORAL_TABLET | Freq: Every day | ORAL | 6 refills | Status: DC

## 2016-11-01 MED ORDER — ISOSORBIDE MONONITRATE ER 30 MG PO TB24
30.0000 mg | ORAL_TABLET | Freq: Every day | ORAL | 6 refills | Status: DC
Start: 2016-11-01 — End: 2016-11-01

## 2016-11-01 NOTE — Telephone Encounter (Signed)
Left detailed message w/ Dr. Windell HummingbirdGollan's recommendation.  Asked him to call back w/ any questions or concerns.  Pt has appt w/ Dr. Mariah MillingGollan on 11/08/16.

## 2016-11-01 NOTE — Telephone Encounter (Addendum)
Spoke w/ pt.  He asks that I call [facility] and cancel 90 day supply & send 30 day supply to his local pharmacy.  Isosorbide is filed w/ [facility] in the event that he can tolerate and needs 90 day supply.

## 2016-11-01 NOTE — Addendum Note (Signed)
Addended by: Rhea BeltonMOODY, AMANDA R on: 11/01/2016 11:42 AM   Modules accepted: Orders

## 2016-11-04 ENCOUNTER — Telehealth: Payer: Self-pay | Admitting: Cardiovascular Disease

## 2016-11-04 NOTE — Telephone Encounter (Signed)
Mail order [facility] wants to verify cardura rx. Please call to verify dosage for cardura rx or fax confirmation to :   Fax : [phone]

## 2016-11-04 NOTE — Telephone Encounter (Signed)
Spoke with the patient Cardura 8 mg take one tablet twice a day was faxed for update to the mail order [facility].

## 2016-11-08 ENCOUNTER — Ambulatory Visit (INDEPENDENT_AMBULATORY_CARE_PROVIDER_SITE_OTHER): Payer: Medicare Other | Admitting: Cardiovascular Disease

## 2016-11-08 ENCOUNTER — Encounter: Payer: Self-pay | Admitting: Cardiovascular Disease

## 2016-11-08 VITALS — BP 130/68 | HR 66 | Ht 71.0 in | Wt 183.0 lb

## 2016-11-08 DIAGNOSIS — I2510 Atherosclerotic heart disease of native coronary artery without angina pectoris: Secondary | ICD-10-CM

## 2016-11-08 DIAGNOSIS — I350 Nonrheumatic aortic (valve) stenosis: Secondary | ICD-10-CM

## 2016-11-08 DIAGNOSIS — I10 Essential (primary) hypertension: Secondary | ICD-10-CM

## 2016-11-08 DIAGNOSIS — Z952 Presence of prosthetic heart valve: Secondary | ICD-10-CM | POA: Diagnosis not present

## 2016-11-08 MED ORDER — ISOSORBIDE MONONITRATE ER 30 MG PO TB24: 30.0000 mg | ORAL_TABLET | Freq: Every day | ORAL | 3 refills | Status: DC

## 2016-11-08 NOTE — Patient Instructions (Addendum)
Medication Instructions:   No medication changes made  Please take extra lasix as needed for shortness of breath, ABD bloating, weight gain, cough when flat  Labwork:  No new labs needed  Testing/Procedures:  No further testing at this time   I recommend watching educational videos on topics of interest to you at:       www.goemmi.com  Enter code: HEARTCARE    Follow-Up: It was a pleasure seeing you in the office today. Please call us if you have new issues that need to be addressed before your next appt.  [phone]  Your physician wants you to follow-up in: 2 months.  You will receive a reminder letter in the mail two months in advance. If you don't receive a letter, please call our office to schedule the follow-up appointment.  If you need a refill on your cardiac medications before your next appointment, please call your pharmacy.

## 2016-11-08 NOTE — Progress Notes (Signed)
Cardiology Office Note  Date:  11/08/2016   ID:  [name], DOB 03-14-1931, MRN [id number]  PCP:  [name], [name], MD   Chief Complaint  Patient presents with  . other    1 month f/u c/o sob. Meds reviewed verbally with pt.    HPI:  82 year old male with h/o aortic stenosis s/p St. Jude mechanical aortic valve replacement in 2002 on chronic Coumadin therapy, mild CAD by cardiac cath in 2011 managed medically, GERD, HTN, carotid bruits, history of asymptomatic bradycardia, RLS, anxiety, depression, and osteoarthritis s/p total left hip replacement on 03/21/2015 who was recently discharged from [facility] the above surgery with a hgb of 8 (baseline 12), he was started on po iron replacement therapy, who presented to on 04/02/15 with acute onset of weakness, melena, BRBPR, and was found to have a hgb of 5.1. Aspirin and warfarin initially held, restarted at a later date, S/p EGD He presents today for follow-up of his aortic valve replacement And hypertension  On his last clinic visit he had severe hypertension Since then we have tried several medications including hydralazine, Cardura, amlodipine He had side effects to several medications including the hydralazine, amlodipine He current takes Cardura 8 mg twice a day, valsartan 320 minute grams daily, metoprolol 25 mg daily, isosorbide 30 mg daily On this regimen he reports blood pressure is relatively well-controlled  His Lasix was decreased by Dr. walker secondary to low sodium Unfortunately weight has climbed, worsening leg edema, abdominal bloating, shortness of breath. Last sodium several days ago was 128 Only takes Lasix once a week now, previously was taking Lasix 3 times per week He also reports having a cough that night  Other past medical history reviewed 48 hour Holter monitor 12/2015 Normal sinus rhythm Average heart beat 70 bpm, maximum heart rate 114 bpm, minimum 45 bpm (at 3 AM) Frequent APCs, couplets,  bigeminy's with short runs, longest run of 5 beats (12% of all beats), Rare PVC   leg edema is better by holding amlodipine 10 mg Previous drop in Hemoglobin 9 improved up to 14  Previous Echocardiogram for Feb 22nd 2017 reviewed with him showing well-functioning aortic valve  Previous cardiac catheterization report from 2011 reviewed with him showing 20% proximal LAD, 40% mid LAD disease   PMH:   has a past medical history of Anxiety (10/11); Aortic stenosis; Bradycardia; C. difficile colitis; Carotid bruit; Chronic diastolic CHF (congestive heart failure) (HCC); Coronary artery disease; Decreased hearing; Depression; Gastric ulcer; GERD (gastroesophageal reflux disease); Hypertension; RLS (restless legs syndrome) (08/23/2015); S/P AVR; and SOB (shortness of breath) (10/11).  PSH:    Past Surgical History:  Procedure Laterality Date  . CARDIAC CATHETERIZATION    . ESOPHAGOGASTRODUODENOSCOPY N/A 04/05/2015   Procedure: ESOPHAGOGASTRODUODENOSCOPY (EGD);  Surgeon: [name], MD;  [facility];  Service: Endoscopy;  Laterality: N/A;  . ESOPHAGOGASTRODUODENOSCOPY N/A 04/17/2015   Procedure: ESOPHAGOGASTRODUODENOSCOPY (EGD);  Surgeon: [name], MD;  [facility];  Service: Endoscopy;  Laterality: N/A;  . ESOPHAGOGASTRODUODENOSCOPY N/A 08/02/2015   Procedure: ESOPHAGOGASTRODUODENOSCOPY (EGD);  Surgeon: [name], MD;  [facility];  Service: Endoscopy;  Laterality: N/A;  . HERNIA REPAIR    . JOINT REPLACEMENT    . TOTAL HIP ARTHROPLASTY    . VALVE REPLACEMENT  1/02   Aortic; echo 3/09 valve working well; echo 10/11 working well; put on Coumadin    Current Outpatient Prescriptions  Medication Sig Dispense Refill  . acetaminophen (TYLENOL) 500 MG  tablet Take 500 mg by mouth every 4 (four) hours as needed for mild pain or headache.     . ALPRAZolam (XANAX) 0.5 MG tablet Take 0.5 tablets (0.25 mg total) by mouth daily as needed for anxiety.  (Patient taking differently: Take 0.5 mg by mouth daily as needed for anxiety. ) 10 tablet 0  . celecoxib (CELEBREX) 100 MG capsule Take 100 mg by mouth as needed.    . Cetirizine HCl 10 MG CAPS Take 10 mg by mouth daily.    . citalopram (CELEXA) 20 MG tablet Take 20 mg by mouth daily.    Marland Kitchen. doxazosin (CARDURA) 8 MG tablet Take 1 tablet (8 mg total) by mouth 2 (two) times daily. 180 tablet 3  . finasteride (PROSCAR) 5 MG tablet Take 5 mg by mouth daily.      . furosemide (LASIX) 20 MG tablet Take 1 tablet (20 mg total) by mouth daily as needed. 30 tablet 6  . isosorbide mononitrate (IMDUR) 30 MG 24 hr tablet Take 1 tablet (30 mg total) by mouth daily. 90 tablet 3  . meloxicam (MOBIC) 7.5 MG tablet Take 7.5 mg by mouth as needed.    . metoprolol succinate (TOPROL XL) 25 MG 24 hr tablet Take 1 tablet (25 mg total) by mouth every evening. 30 tablet 11  . Multiple Vitamin (MULTIVITAMIN WITH MINERALS) TABS tablet Take 1 tablet by mouth daily.    . pantoprazole (PROTONIX) 40 MG tablet Take 1 tablet (40 mg total) by mouth daily. 30 tablet 2  . rOPINIRole (REQUIP) 2 MG tablet Take 1 tablet by mouth 4  times daily 360 tablet 3  . Tamsulosin HCl (FLOMAX) 0.4 MG CAPS Take 0.4 mg by mouth at bedtime.     . traZODone (DESYREL) 50 MG tablet Take 50 mg by mouth at bedtime.    . valsartan (DIOVAN) 320 MG tablet Take 320 mg by mouth daily.      Marland Kitchen. warfarin (COUMADIN) 5 MG tablet Take as directed by [facility] 150 tablet 1   No current facility-administered medications for this visit.      Allergies:   Levofloxacin and Sulfa antibiotics   Social History:  The patient  reports that he has quit smoking. He has never used smokeless tobacco. He reports that he drinks about 1.8 oz of alcohol per week . He reports that he does not use drugs.   Family History:   family history includes Diabetes type II in his mother; Heart attack in his mother; Heart disease in his father and mother; Hypertension in his father  and mother; Stroke in his brother, brother, and father.    Review of Systems: Review of Systems  Constitutional: Positive for malaise/fatigue.       Weight gain  Respiratory: Positive for cough.   Cardiovascular: Positive for leg swelling.  Gastrointestinal: Negative.   Musculoskeletal: Negative.   Neurological: Positive for dizziness.  Psychiatric/Behavioral: Negative.   All other systems reviewed and are negative.    PHYSICAL EXAM: VS:  BP 130/68 (BP Location: Left Arm, Patient Position: Sitting, Cuff Size: Normal)   Pulse 66   Ht 5\' 11"  (1.803 m)   Wt 183 lb (83 kg)   SpO2 96%   BMI 25.52 kg/m  , BMI Body mass index is 25.52 kg/m.  Blood pressure remains elevated even on my recheck left arm GEN: Well nourished, well developed, in no acute distress  HEENT: normal  Neck: no JVD, carotid bruits, or masses Cardiac: RRR; 2+ SEM  RSB,  No rubs, or gallops,no edema  Respiratory:  Clear with dullness at the bases bilaterally, normal work of breathing GI: soft, nontender, nondistended, + BS MS: no deformity or atrophy  Skin: warm and dry, no rash Neuro:  Strength and sensation are intact Psych: euthymic mood, full affect    Recent Labs: 01/03/2016: BUN 21; Creatinine, Ser 1.12; Platelets 222; Potassium 4.8; Sodium 134    Lipid Panel No results found for: CHOL, HDL, LDLCALC, TRIG    Wt Readings from Last 3 Encounters:  11/08/16 183 lb (83 kg)  10/15/16 202 lb 4 oz (91.7 kg)  10/14/16 207 lb (93.9 kg)       ASSESSMENT AND PLAN:  Coronary artery disease involving native coronary artery of native heart without angina pectoris - Plan: EKG 12-Lead Currently with no symptoms of angina. No further workup at this time. Continue current medication regimen.  Aortic valve stenosis, etiology of cardiac valve disease unspecified - Plan: EKG 12-Lead Recent echocardiogram earlier in 2017, showed well functioning aortic valve prosthesis  Malignant hypertension Baseline blood  pressure typically 130 systolic Currently on a regimen that he feels is working No changes made  Warfarin anticoagulation Tolerating anticoagulation,  Bilateral leg edema Pitting edema on today's visit, recommended he take Lasix as needed for leg swelling  Acute on chronic diastolic CHF Weight is up 10 pounds from his prior clinic visit Lasix decreased by primary care for low sodium Suggested he try Lasix 3 days in a row with extra dietary sodium Suggested he restrict free water  Hyponatremia Suggested free water restriction Liberalize his salt intake Sodium has stayed low despite cutting back on his Lasix down to 1 time per week Likely secondary to heart failure   Total encounter time more than 25 minutes  Greater than 50% was spent in counseling and coordination of care with the patient   Disposition:   F/U  2 month   No orders of the defined types were placed in this encounter.    Signed, Dossie Arbour, M.D., Ph.D. 11/08/2016  [facility] [place], [place] [phone]

## 2016-11-11 ENCOUNTER — Telehealth: Payer: Self-pay | Admitting: *Deleted

## 2016-11-11 DIAGNOSIS — E871 Hypo-osmolality and hyponatremia: Secondary | ICD-10-CM

## 2016-11-11 NOTE — Telephone Encounter (Signed)
-----   Message from Antonieta Ibaimothy J Gollan, MD sent at 11/08/2016  6:54 PM EST ----- Regarding: Low-sodium Would contact patient and suggest he try to avoid free water Free water can cause low sodium thx TG

## 2016-11-11 NOTE — Telephone Encounter (Signed)
Did not mention renal u/s unless that was in discuss of workup if BP did not improve. His BP is better, so likely does not need He does need BMP in a few weeks to check sodium (hyponatremia) Perhaps in jan 2018

## 2016-11-11 NOTE — Telephone Encounter (Signed)
Spoke with patient and let him know that Dr. Mariah MillingGollan didn't feel it was needed at this time but he did want to get some repeat labs. He verbalized understanding of our conversation, agreement with plan, and had no further questions at this time. Let him know that I would have someone to call and schedule his lab appointment in January. He was appreciative for the call back.

## 2016-11-11 NOTE — Telephone Encounter (Signed)
Reviewed with patient to decrease his free water and he stated that Dr. Mariah MillingGollan mentioned something about a kidney ultrasound and he wanted to know about getting that done. Reviewed previous office visit notes and did not see this mentioned so let patient know that we would need to check with Dr. Mariah MillingGollan. He verbalized understanding of our conversation and had no further questions at this time.

## 2016-11-12 ENCOUNTER — Telehealth: Payer: Self-pay | Admitting: Cardiovascular Disease

## 2016-11-12 NOTE — Telephone Encounter (Signed)
Patient states that he has had some shortness breath, swelling, and weight gain. Current weight is 203lbs. Instructed him to take 2 tablets of lasix for three days and we will set him up to see Dr. Mariah MillingGollan next month after he comes in to have labs done. He verbalized agreement with plan of care and had no further questions at this time. He is currently in another doctors office being seen so he requested that someone call him back to schedule follow up.

## 2016-11-12 NOTE — Telephone Encounter (Signed)
Pt states he will be willing to come see us on 12/09/16 to see Dr Mariah MillingGollan He is also aware of us changing the dose on his fluid pills but he states he has a "low sodium" issues as well Would like to know what to do in this case Please advise.

## 2016-11-12 NOTE — Telephone Encounter (Signed)
Patient calling back to confirm medication change. Spoke with Ward Givenshris Berge NP and he agreed for patient to try taking 2 tablets of lasix for 3 days to see if that helps with the swelling, weight gain, and shortness of breath. Let patient know to call back after that if symptoms have not improved. He verbalized understanding and had no further questions at this time.

## 2016-11-12 NOTE — Telephone Encounter (Signed)
Pt c/o swelling: STAT is pt has developed SOB within 24 hours  1. How long have you been experiencing swelling? Three day   2. Where is the swelling located?feet and going up his leg  3.  Are you currently taking a "fluid pill"? yes  4.  Are you currently SOB? States its been going on for a while now.   5.  Have you traveled recently? No

## 2016-11-12 NOTE — Telephone Encounter (Signed)
Left voicemail message to call back  

## 2016-11-22 ENCOUNTER — Other Ambulatory Visit: Payer: Self-pay | Admitting: Cardiovascular Disease

## 2016-11-22 MED ORDER — ISOSORBIDE MONONITRATE ER 30 MG PO TB24: 30.0000 mg | ORAL_TABLET | Freq: Every day | ORAL | 3 refills | Status: DC

## 2016-11-22 NOTE — Telephone Encounter (Signed)
°*  STAT* If patient is at the pharmacy, call can be transferred to refill team.   1. Which medications need to be refilled? (please list name of each medication and dose if known)  Isosorbide   2. Which pharmacy/location (including street and city if local pharmacy) is medication to be sent to?  [facility]   3. Do they need a 30 day or 90 day supply?  90 day

## 2016-11-26 ENCOUNTER — Ambulatory Visit: Payer: Medicare Other | Admitting: Cardiovascular Disease

## 2016-11-27 ENCOUNTER — Ambulatory Visit (INDEPENDENT_AMBULATORY_CARE_PROVIDER_SITE_OTHER): Payer: Medicare Other | Admitting: *Deleted

## 2016-11-27 DIAGNOSIS — Z952 Presence of prosthetic heart valve: Secondary | ICD-10-CM

## 2016-11-27 DIAGNOSIS — I359 Nonrheumatic aortic valve disorder, unspecified: Secondary | ICD-10-CM | POA: Diagnosis not present

## 2016-11-27 DIAGNOSIS — Z7901 Long term (current) use of anticoagulants: Secondary | ICD-10-CM | POA: Diagnosis not present

## 2016-11-27 DIAGNOSIS — I350 Nonrheumatic aortic (valve) stenosis: Secondary | ICD-10-CM | POA: Diagnosis not present

## 2016-11-27 LAB — POCT INR: INR: 3.3

## 2016-12-02 ENCOUNTER — Other Ambulatory Visit: Payer: Self-pay | Admitting: Adult Health

## 2016-12-04 ENCOUNTER — Other Ambulatory Visit (INDEPENDENT_AMBULATORY_CARE_PROVIDER_SITE_OTHER): Payer: Medicare Other

## 2016-12-04 DIAGNOSIS — E871 Hypo-osmolality and hyponatremia: Secondary | ICD-10-CM | POA: Diagnosis not present

## 2016-12-05 LAB — BASIC METABOLIC PANEL
BUN/Creatinine Ratio: 18 (ref 10–24)
BUN: 18 mg/dL (ref 8–27)
CALCIUM: 9.3 mg/dL (ref 8.6–10.2)
CO2: 24 mmol/L (ref 18–29)
Chloride: 92 mmol/L — ABNORMAL LOW (ref 96–106)
Creatinine, Ser: 1 mg/dL (ref 0.76–1.27)
GFR, EST AFRICAN AMERICAN: 80 mL/min/{1.73_m2} (ref >59)
GFR, EST NON AFRICAN AMERICAN: 69 mL/min/{1.73_m2} (ref >59)
Glucose: 90 mg/dL (ref 65–99)
POTASSIUM: 4.7 mmol/L (ref 3.5–5.2)
SODIUM: 131 mmol/L — AB (ref 134–144)

## 2016-12-09 ENCOUNTER — Encounter: Payer: Self-pay | Admitting: Cardiovascular Disease

## 2016-12-09 ENCOUNTER — Ambulatory Visit (INDEPENDENT_AMBULATORY_CARE_PROVIDER_SITE_OTHER): Payer: Medicare Other | Admitting: Cardiovascular Disease

## 2016-12-09 VITALS — BP 162/72 | HR 58 | Ht 71.0 in | Wt 200.0 lb

## 2016-12-09 DIAGNOSIS — Z952 Presence of prosthetic heart valve: Secondary | ICD-10-CM | POA: Diagnosis not present

## 2016-12-09 DIAGNOSIS — I2510 Atherosclerotic heart disease of native coronary artery without angina pectoris: Secondary | ICD-10-CM | POA: Diagnosis not present

## 2016-12-09 DIAGNOSIS — R0602 Shortness of breath: Secondary | ICD-10-CM

## 2016-12-09 DIAGNOSIS — I10 Essential (primary) hypertension: Secondary | ICD-10-CM | POA: Diagnosis not present

## 2016-12-09 MED ORDER — ISOSORBIDE MONONITRATE ER 30 MG PO TB24: 30.0000 mg | ORAL_TABLET | Freq: Two times a day (BID) | ORAL | 3 refills | Status: DC

## 2016-12-09 NOTE — Progress Notes (Signed)
Cardiology Office Note  Date:  12/09/2016   ID:  Vito BergerSydnor Arnold Favata, DOB [birth year], MRN [id number]  PCP:  [name], [name], MD   Chief Complaint  Patient presents with  . other    Pt. c/o LE edema and shortness of breath. Meds reviewed by the pt. verbally.     HPI:  83 year old male with h/o aortic stenosis s/p St. Jude mechanical aortic valve replacement in 2002 on chronic Coumadin therapy, mild CAD by cardiac cath in 2011 managed medically, GERD, HTN, carotid bruits, history of asymptomatic bradycardia, RLS, anxiety, depression, and osteoarthritis s/p total left hip replacement on 03/21/2015 who was [facility] from [facility] the above surgery with a hgb of 8 (baseline 12), he was started on po iron replacement therapy, who presented to on 04/02/15 with acute onset of weakness, melena, BRBPR, and was found to have a hgb of 5.1. Aspirin and warfarin initially held, restarted at a later date, S/p EGD He presents today for follow-up of his aortic valve replacement And hypertension  In follow-up he reports that his blood pressure has been running consistently elevated Avg BP 160 systolic He reports his weight has been stable at 202 pounds over the past several months  Not on hydralazine or amlodipine secondary to side effects He current takes Cardura 8 mg twice a day, valsartan 320 minute grams daily, metoprolol 25 mg daily, isosorbide 30 mg daily His Lasix was decreased by Dr. walker secondary to low sodium Lab work reviewed with him today, sodium continues to run low 131 Currently takes Lasix once a week  EKG on today's visit shows normal sinus rhythm with rate 58 bpm, no significant ST or T-wave changes  Other past medical history reviewed 48 hour Holter monitor 12/2015 Normal sinus rhythm Average heart beat 70 bpm, maximum heart rate 114 bpm, minimum 45 bpm (at 3 AM) Frequent APCs, couplets, bigeminy's with short runs, longest run of 5 beats (12% of all beats), Rare PVC   leg  edema is better by holding amlodipine 10 mg Previous drop in Hemoglobin 9 improved up to 14  Previous Echocardiogram for Feb 22nd 2017 reviewed with him showing well-functioning aortic valve  Previous cardiac catheterization report from 2011 reviewed with him showing 20% proximal LAD, 40% mid LAD disease    PMH:   has a past medical history of Anxiety (10/11); Aortic stenosis; Bradycardia; C. difficile colitis; Carotid bruit; Chronic diastolic CHF (congestive heart failure) (HCC); Coronary artery disease; Decreased hearing; Depression; Gastric ulcer; GERD (gastroesophageal reflux disease); Hypertension; RLS (restless legs syndrome) (08/23/2015); S/P AVR; and SOB (shortness of breath) (10/11).  PSH:    Past Surgical History:  Procedure Laterality Date  . CARDIAC CATHETERIZATION    . ESOPHAGOGASTRODUODENOSCOPY N/A 04/05/2015   Procedure: ESOPHAGOGASTRODUODENOSCOPY (EGD);  Surgeon: [name], MD;  [facility];  Service: Endoscopy;  Laterality: N/A;  . ESOPHAGOGASTRODUODENOSCOPY N/A 04/17/2015   Procedure: ESOPHAGOGASTRODUODENOSCOPY (EGD);  Surgeon: [name], MD;  [facility];  Service: Endoscopy;  Laterality: N/A;  . ESOPHAGOGASTRODUODENOSCOPY N/A 08/02/2015   Procedure: ESOPHAGOGASTRODUODENOSCOPY (EGD);  Surgeon: [name], MD;  [facility];  Service: Endoscopy;  Laterality: N/A;  . HERNIA REPAIR    . JOINT REPLACEMENT    . TOTAL HIP ARTHROPLASTY    . VALVE REPLACEMENT  1/02   Aortic; echo 3/09 valve working well; echo 10/11 working well; put on Coumadin    Current Outpatient Prescriptions  Medication Sig Dispense Refill  . acetaminophen (TYLENOL) 500 MG  tablet Take 500 mg by mouth every 4 (four) hours as needed for mild pain or headache.     . ALPRAZolam (XANAX) 0.5 MG tablet Take 0.5 tablets (0.25 mg total) by mouth daily as needed for anxiety. (Patient taking differently: Take 0.5 mg by mouth daily as needed for anxiety. ) 10 tablet  0  . Cetirizine HCl 10 MG CAPS Take 10 mg by mouth daily.    . citalopram (CELEXA) 20 MG tablet Take 20 mg by mouth daily.    Marland Kitchen doxazosin (CARDURA) 8 MG tablet Take 1 tablet (8 mg total) by mouth 2 (two) times daily. 180 tablet 3  . finasteride (PROSCAR) 5 MG tablet Take 5 mg by mouth daily.      . furosemide (LASIX) 20 MG tablet, 2 pills once a week Take 1 tablet (20 mg total) by mouth daily as needed. 30 tablet 6  . isosorbide mononitrate (IMDUR) 30 MG 24 hr tablet Take 1 tablet (30 mg total) by mouth daily. 90 tablet 3  . meloxicam (MOBIC) 7.5 MG tablet Take 7.5 mg by mouth as needed.    . metoprolol succinate (TOPROL XL) 25 MG 24 hr tablet Take 1 tablet (25 mg total) by mouth every evening. 30 tablet 11  . Multiple Vitamin (MULTIVITAMIN WITH MINERALS) TABS tablet Take 1 tablet by mouth daily.    . pantoprazole (PROTONIX) 40 MG tablet Take 1 tablet (40 mg total) by mouth daily. 30 tablet 2  . rOPINIRole (REQUIP) 2 MG tablet Take 1 tablet by mouth 4  times daily 360 tablet 3  . Tamsulosin HCl (FLOMAX) 0.4 MG CAPS Take 0.4 mg by mouth at bedtime.     . traZODone (DESYREL) 50 MG tablet Take 50 mg by mouth at bedtime.    . valsartan (DIOVAN) 320 MG tablet Take 320 mg by mouth daily.      Marland Kitchen warfarin (COUMADIN) 5 MG tablet Take as directed by [facility] 150 tablet 1   No current facility-administered medications for this visit.      Allergies:   Levofloxacin and Sulfa antibiotics   Social History:  The patient  reports that he has quit smoking. He has never used smokeless tobacco. He reports that he drinks about 1.8 oz of alcohol per week . He reports that he does not use drugs.   Family History:   family history includes Diabetes type II in his mother; Heart attack in his mother; Heart disease in his father and mother; Hypertension in his father and mother; Stroke in his brother, brother, and father.    Review of Systems: Review of Systems  Constitutional: Negative.   Respiratory:  Negative.   Cardiovascular: Negative.   Gastrointestinal: Negative.   Musculoskeletal: Negative.   Neurological: Negative.   Psychiatric/Behavioral: Negative.   All other systems reviewed and are negative.    PHYSICAL EXAM: VS:  BP (!) 162/72 (BP Location: Left Arm, Patient Position: Sitting, Cuff Size: Normal)   Pulse (!) 58   Ht 5\' 11"  (1.803 m)   Wt 200 lb (90.7 kg)   BMI 27.89 kg/m  , BMI Body mass index is 27.89 kg/m. GEN: Well nourished, well developed, in no acute distress  HEENT: normal  Neck: no JVD, carotid bruits, or masses Cardiac: RRR; no murmurs, rubs, or gallops,no edema  Respiratory:  clear to auscultation bilaterally, normal work of breathing GI: soft, nontender, nondistended, + BS MS: no deformity or atrophy  Skin: warm and dry, no rash Neuro:  Strength and sensation  are intact Psych: euthymic mood, full affect    Recent Labs: 01/03/2016: Platelets 222 12/04/2016: BUN 18; Creatinine, Ser 1.00; Potassium 4.7; Sodium 131    Lipid Panel No results found for: CHOL, HDL, LDLCALC, TRIG    Wt Readings from Last 3 Encounters:  12/09/16 200 lb (90.7 kg)  11/08/16 183 lb (83 kg) (wrong weight?)  10/15/16 202 lb 4 oz (91.7 kg)       ASSESSMENT AND PLAN:  Essential hypertension - Plan: EKG 12-Lead Long discussion concerning his hypertension I recommended that he increase his isosorbide up to 30 mg twice a day, continued close monitoring of his blood pressure  Coronary artery disease involving native coronary artery of native heart without angina pectoris - Plan: EKG 12-Lead Currently with no symptoms of angina. No further workup at this time. Continue current medication regimen.  S/P AVR (aortic valve replacement) Most recent echocardiogram February 2017 showing stable prosthetic aortic valve  SOB (shortness of breath) Stable Chronic mild shortness of breath, no further workup at this time   Total encounter time more than 15 minutes  Greater than 50%  was spent in counseling and coordination of care with the patient   Disposition:   F/U  6 months   Orders Placed This Encounter  Procedures  . EKG 12-Lead     Signed, Dossie Arbour, M.D., Ph.D. 12/09/2016  [facility] [place], [place] [phone]

## 2016-12-09 NOTE — Patient Instructions (Addendum)
Medication Instructions:   Please increase the isosorbide up to twice a day for blood pressure  Labwork:  No new labs needed  Testing/Procedures:  No further testing at this time   I recommend watching educational videos on topics of interest to you at:       [email or web address].[email or web address]  Enter code: HEARTCARE    Follow-Up: It was a pleasure seeing you in the office today. Please call us if you have new issues that need to be addressed before your next appt.  [phone]  Your physician wants you to follow-up in: 6 months.  You will receive a reminder letter in the mail two months in advance. If you don't receive a letter, please call our office to schedule the follow-up appointment.  If you need a refill on your cardiac medications before your next appointment, please call your pharmacy.

## 2016-12-18 ENCOUNTER — Ambulatory Visit (INDEPENDENT_AMBULATORY_CARE_PROVIDER_SITE_OTHER): Payer: Medicare Other

## 2016-12-18 DIAGNOSIS — I359 Nonrheumatic aortic valve disorder, unspecified: Secondary | ICD-10-CM

## 2016-12-18 DIAGNOSIS — Z952 Presence of prosthetic heart valve: Secondary | ICD-10-CM

## 2016-12-18 DIAGNOSIS — I350 Nonrheumatic aortic (valve) stenosis: Secondary | ICD-10-CM | POA: Diagnosis not present

## 2016-12-18 DIAGNOSIS — Z7901 Long term (current) use of anticoagulants: Secondary | ICD-10-CM | POA: Diagnosis not present

## 2016-12-18 LAB — POCT INR: INR: 1.9

## 2017-01-08 ENCOUNTER — Ambulatory Visit (INDEPENDENT_AMBULATORY_CARE_PROVIDER_SITE_OTHER): Payer: Medicare Other

## 2017-01-08 DIAGNOSIS — I350 Nonrheumatic aortic (valve) stenosis: Secondary | ICD-10-CM

## 2017-01-08 DIAGNOSIS — Z7901 Long term (current) use of anticoagulants: Secondary | ICD-10-CM | POA: Diagnosis not present

## 2017-01-08 DIAGNOSIS — Z952 Presence of prosthetic heart valve: Secondary | ICD-10-CM | POA: Diagnosis not present

## 2017-01-08 DIAGNOSIS — I359 Nonrheumatic aortic valve disorder, unspecified: Secondary | ICD-10-CM | POA: Diagnosis not present

## 2017-01-08 LAB — POCT INR: INR: 2.6

## 2017-01-13 ENCOUNTER — Other Ambulatory Visit: Payer: Self-pay | Admitting: Cardiovascular Disease

## 2017-01-13 ENCOUNTER — Telehealth: Payer: Self-pay | Admitting: Cardiovascular Disease

## 2017-01-13 MED ORDER — METOPROLOL SUCCINATE ER 25 MG PO TB24: 25.0000 mg | ORAL_TABLET | Freq: Every evening | ORAL | 3 refills | Status: DC

## 2017-01-13 NOTE — Telephone Encounter (Signed)
Refill sent.

## 2017-01-13 NOTE — Telephone Encounter (Signed)
°*  STAT* If patient is at the pharmacy, call can be transferred to refill team.   1. Which medications need to be refilled? (please list name of each medication and dose if known) metoprolol   2. Which pharmacy/location (including street and city if local pharmacy) is medication to be sent to? [facility]   3. Do they need a 30 day or 90 day supply? 90 day

## 2017-02-04 ENCOUNTER — Other Ambulatory Visit: Payer: Self-pay | Admitting: Adult Health

## 2017-02-05 ENCOUNTER — Ambulatory Visit (INDEPENDENT_AMBULATORY_CARE_PROVIDER_SITE_OTHER): Payer: Medicare Other

## 2017-02-05 DIAGNOSIS — Z7901 Long term (current) use of anticoagulants: Secondary | ICD-10-CM | POA: Diagnosis not present

## 2017-02-05 DIAGNOSIS — I350 Nonrheumatic aortic (valve) stenosis: Secondary | ICD-10-CM

## 2017-02-05 DIAGNOSIS — Z952 Presence of prosthetic heart valve: Secondary | ICD-10-CM

## 2017-02-05 DIAGNOSIS — I359 Nonrheumatic aortic valve disorder, unspecified: Secondary | ICD-10-CM

## 2017-02-05 LAB — POCT INR: INR: 2.7

## 2017-02-12 ENCOUNTER — Telehealth: Payer: Self-pay | Admitting: Neurology

## 2017-02-12 IMAGING — CR DG CHEST 1V PORT
1 series · 1 of 1 positions shown · non-contrast
Comparison: 04/09/2015

CLINICAL DATA: PICC line placement

EXAM:
PORTABLE CHEST - 1 VIEW

[ap]
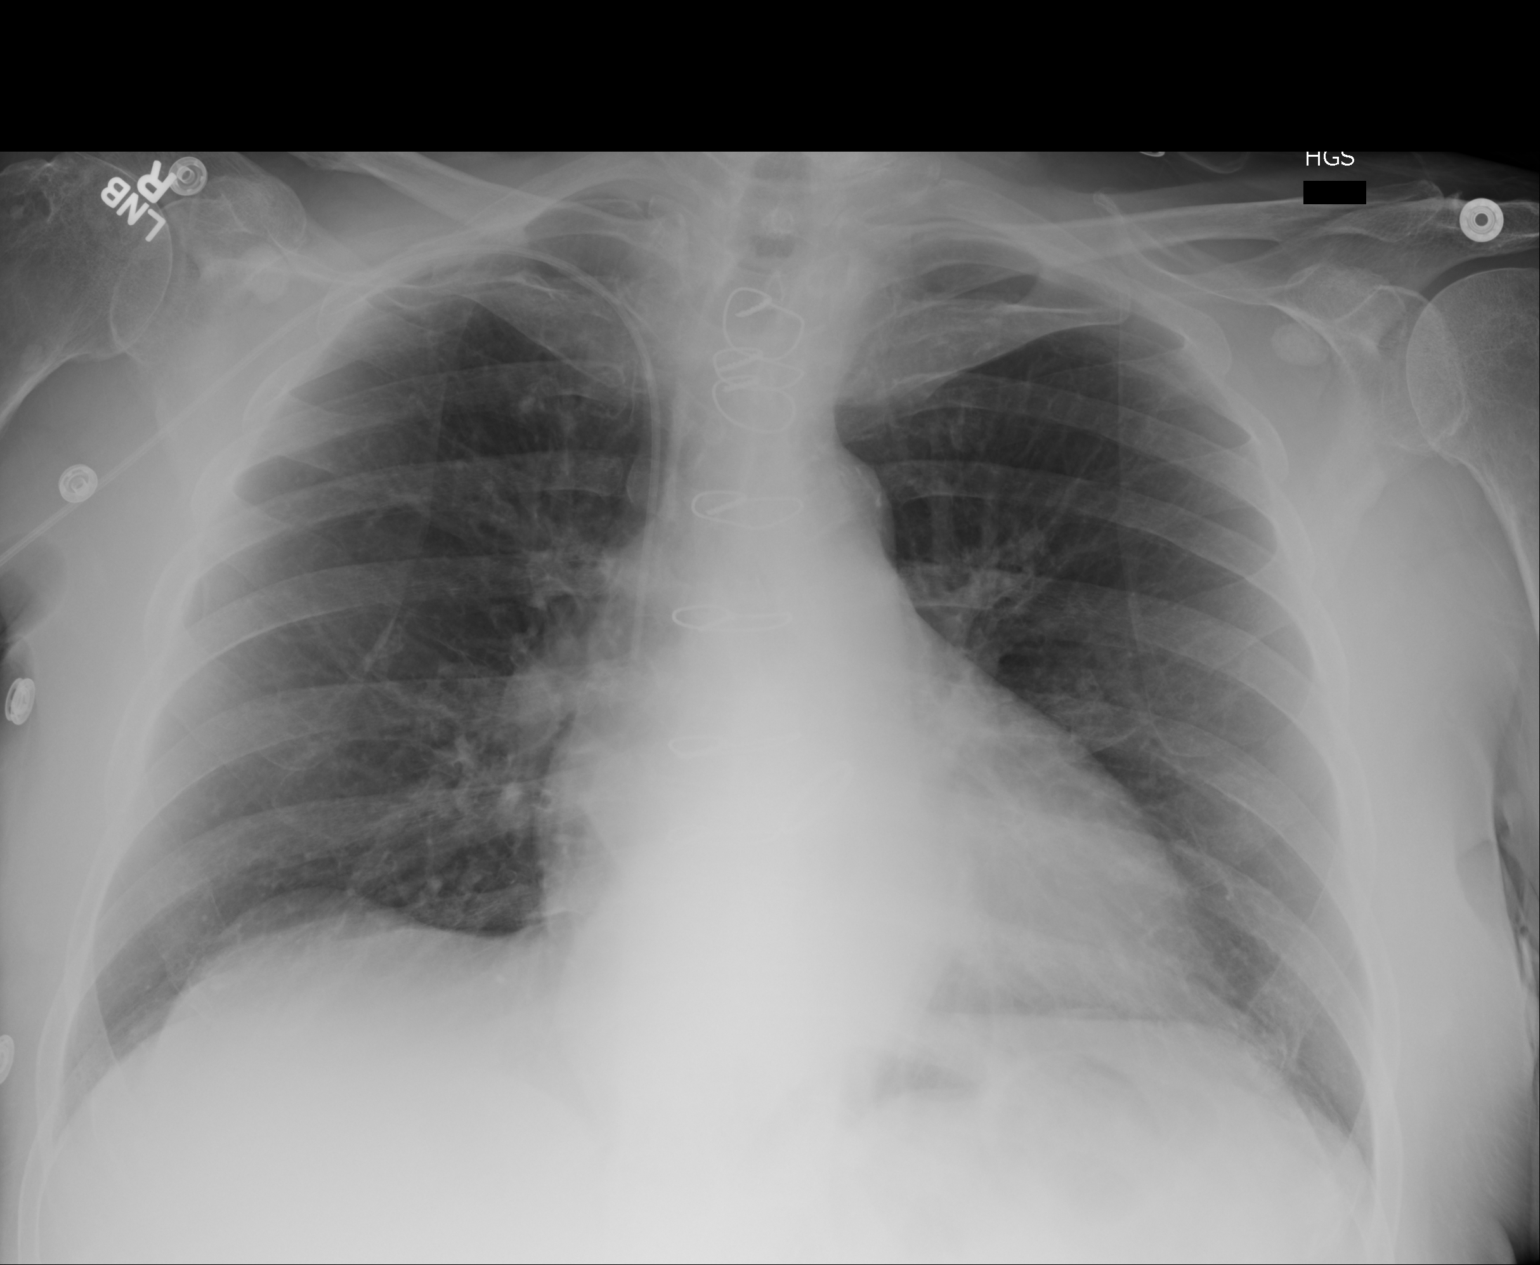

[1 of 1 positions shown; findings below may reference images not displayed]

FINDINGS: Sternotomy wires overlie normal cardiac silhouette. Interval
placement of a right PICC line with tip in the distal SVC. Mild
basilar atelectasis.
IMPRESSION: Right PICC line in appropriate position.

## 2017-02-12 NOTE — Telephone Encounter (Signed)
I called the patient. He is complaining of blurred vision and dizziness, but he has been on the same dose of Requip for at least one year. He claims that the side effects just began 2 months ago, he does recall that some of his blood pressure medications were readjusted at that time.  The patient denies that the Requip tablets have changed in color or size that would suggest a new generic manufacturer. I think that it is unlikely that the Requip is causing his new symptoms.

## 2017-02-12 NOTE — Telephone Encounter (Signed)
Pt called stating that the rOPINIRole (REQUIP) 2 MG tablet he believes is making him dizzy and affecting his vision.  He stated his cell is the best number to reach him on

## 2017-02-12 NOTE — Telephone Encounter (Signed)
I called patient. I left messages, I'll call back later.

## 2017-02-16 IMAGING — CR DG CHEST 1V PORT
1 series · 1 of 1 positions shown · non-contrast
Comparison: Chest x-ray of 04/11/2015

CLINICAL DATA: Evaluation of PICC line placement

EXAM:
PORTABLE CHEST - 1 VIEW

[ap]
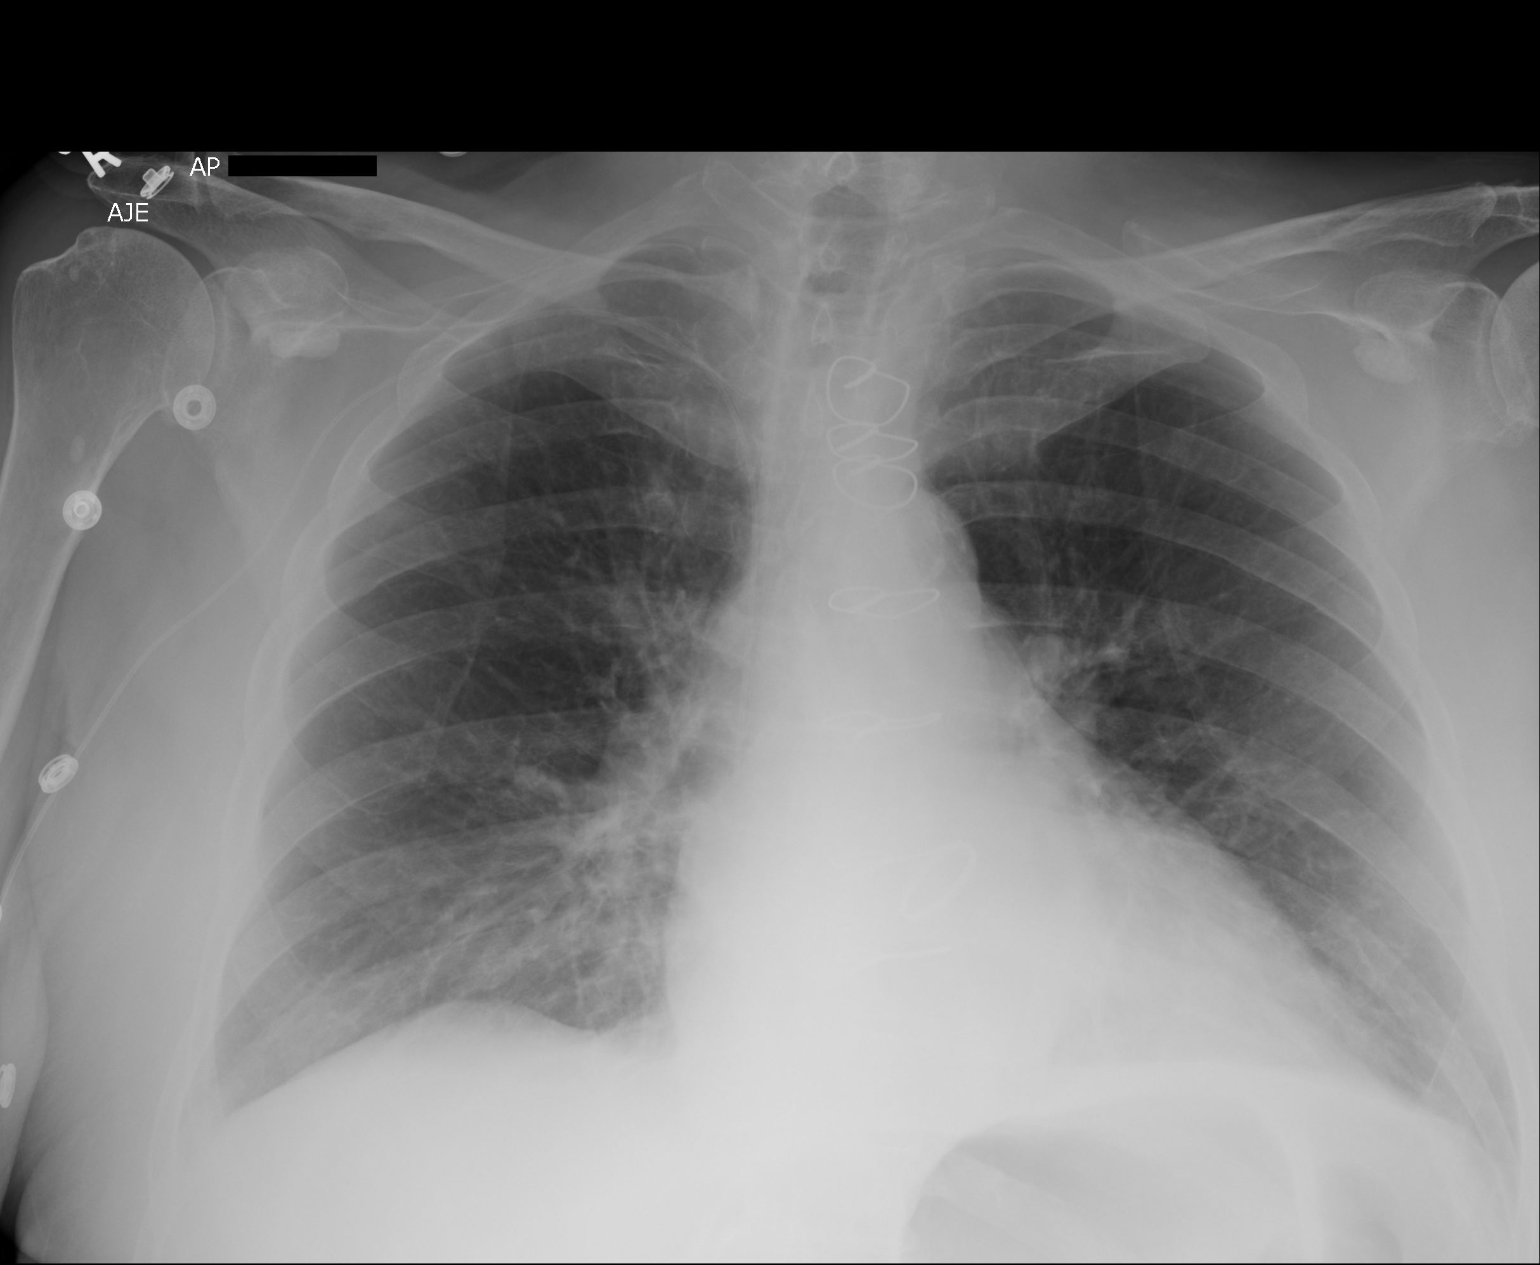

[1 of 1 positions shown; findings below may reference images not displayed]

FINDINGS: A right-sided PICC line is present with the tip overlying the lower
SVC just above the expected SVC -RA junction. There is some
indistinctness of the perihilar vasculature and mild pulmonary
vascular congestion cannot be excluded. No infiltrate or effusion is
seen. The heart is mildly enlarged. Median sternotomy sutures are
noted.
IMPRESSION: 1. Tip of PICC line overlies the lower SVC near the expected SVC -RA
junction.
2. Somewhat indistinct pulmonary vascularity may indicate mild
pulmonary vascular congestion.

## 2017-02-17 ENCOUNTER — Encounter: Payer: Self-pay | Admitting: *Deleted

## 2017-02-17 ENCOUNTER — Telehealth: Payer: Self-pay | Admitting: Cardiovascular Disease

## 2017-02-17 DIAGNOSIS — I110 Hypertensive heart disease with heart failure: Secondary | ICD-10-CM | POA: Insufficient documentation

## 2017-02-17 DIAGNOSIS — Z87891 Personal history of nicotine dependence: Secondary | ICD-10-CM | POA: Insufficient documentation

## 2017-02-17 DIAGNOSIS — I5032 Chronic diastolic (congestive) heart failure: Secondary | ICD-10-CM | POA: Insufficient documentation

## 2017-02-17 DIAGNOSIS — Z7901 Long term (current) use of anticoagulants: Secondary | ICD-10-CM | POA: Insufficient documentation

## 2017-02-17 DIAGNOSIS — Z79899 Other long term (current) drug therapy: Secondary | ICD-10-CM | POA: Insufficient documentation

## 2017-02-17 DIAGNOSIS — I2510 Atherosclerotic heart disease of native coronary artery without angina pectoris: Secondary | ICD-10-CM | POA: Diagnosis not present

## 2017-02-17 LAB — BASIC METABOLIC PANEL
ANION GAP: 5 (ref 5–15)
BUN: 24 mg/dL — ABNORMAL HIGH (ref 6–20)
CHLORIDE: 97 mmol/L — AB (ref 101–111)
CO2: 28 mmol/L (ref 22–32)
Calcium: 9.1 mg/dL (ref 8.9–10.3)
Creatinine, Ser: 0.93 mg/dL (ref 0.61–1.24)
GFR calc Af Amer: >60 mL/min (ref >60)
GLUCOSE: 102 mg/dL — AB (ref 65–99)
POTASSIUM: 4.6 mmol/L (ref 3.5–5.1)
SODIUM: 130 mmol/L — AB (ref 135–145)

## 2017-02-17 LAB — CBC
HEMATOCRIT: 36.4 % — AB (ref 40.0–52.0)
HEMOGLOBIN: 12.3 g/dL — AB (ref 13.0–18.0)
MCH: 29 pg (ref 26.0–34.0)
MCHC: 33.7 g/dL (ref 32.0–36.0)
MCV: 86.1 fL (ref 80.0–100.0)
Platelets: 231 10*3/uL (ref 150–440)
RBC: 4.23 MIL/uL — ABNORMAL LOW (ref 4.40–5.90)
RDW: 15.9 % — ABNORMAL HIGH (ref 11.5–14.5)
WBC: 7 10*3/uL (ref 3.8–10.6)

## 2017-02-17 LAB — TROPONIN I: Troponin I: <0.03 ng/mL (ref <0.03)

## 2017-02-17 MED ORDER — METOPROLOL SUCCINATE ER 50 MG PO TB24: 50.0000 mg | ORAL_TABLET | Freq: Every day | ORAL | 3 refills | Status: DC

## 2017-02-17 MED ORDER — ISOSORBIDE MONONITRATE ER 30 MG PO TB24: 30.0000 mg | ORAL_TABLET | Freq: Three times a day (TID) | ORAL | 3 refills | Status: DC

## 2017-02-17 MED ORDER — ISOSORBIDE MONONITRATE ER 30 MG PO TB24: 90.0000 mg | ORAL_TABLET | Freq: Three times a day (TID) | ORAL | 3 refills | Status: DC

## 2017-02-17 NOTE — Telephone Encounter (Signed)
Received incoming call from patient. He has been experiencing elevated BP today: 223/91 nad 217/87. He did not write down the heart rate. Lately, his BP has been 170's/75 about 2 hours after taking his morning BP medications. Has blurred vision and weakness at this time. Denies CP, palpitations, N/v or sweating. He has SOB with exertion but this is not a new symptom. Verified medications and he is taking them as prescribed.  Discussed with Eduard Closyan Dunn,PA. Advised if HR>60, then increase toprol to 50mg  daily and increase Imdur to 30mg  TID or Imdur 90mg  by mouth once a day. If symptoms persist, then proceed to the ER.  Patient took heart rate and it was 65 at this time. Patient verbalized understanding. He will go ahead and take an additional Imdur 60 mg now and the Metoprolol as it is almost time for his evening dose. He verbalized understanding that if after an hour to hour and a half to go to the ER if symptoms persist.

## 2017-02-17 NOTE — ED Triage Notes (Signed)
Pt reports elevated blood pressure for 1 week.  No chest pain or sob.  Pt reports feeling weak.  Pt alert.  Speech clear.

## 2017-02-17 NOTE — Telephone Encounter (Signed)
Pt c/o BP issue: STAT if pt c/o blurred vision, one-sided weakness or slurred speech  1. What are your last 5 BP readings?  Today 223/91, 217/87 States he took his medication today around 8 am, readings were taken at 3 pm  2. Are you having any other symptoms (ex. Dizziness, headache, blurred vision, passed out)? Blurred vision and dizziness, and weakness  3. What is your BP issue? elevated

## 2017-02-18 ENCOUNTER — Ambulatory Visit (INDEPENDENT_AMBULATORY_CARE_PROVIDER_SITE_OTHER): Payer: Medicare Other | Admitting: Physician Assistant

## 2017-02-18 ENCOUNTER — Encounter: Payer: Self-pay | Admitting: Physician Assistant

## 2017-02-18 ENCOUNTER — Emergency Department
Admission: EM | Admit: 2017-02-18 | Discharge: 2017-02-18 | Disposition: A | Discharge status: Home / Self Care | Payer: Medicare Other | Attending: Emergency Medicine | Admitting: Emergency Medicine

## 2017-02-18 VITALS — BP 132/72 | HR 64 | Ht 71.0 in | Wt 199.8 lb

## 2017-02-18 DIAGNOSIS — I10 Essential (primary) hypertension: Secondary | ICD-10-CM

## 2017-02-18 DIAGNOSIS — I2510 Atherosclerotic heart disease of native coronary artery without angina pectoris: Secondary | ICD-10-CM

## 2017-02-18 DIAGNOSIS — R0602 Shortness of breath: Secondary | ICD-10-CM | POA: Diagnosis not present

## 2017-02-18 DIAGNOSIS — I350 Nonrheumatic aortic (valve) stenosis: Secondary | ICD-10-CM

## 2017-02-18 DIAGNOSIS — E871 Hypo-osmolality and hyponatremia: Secondary | ICD-10-CM | POA: Diagnosis not present

## 2017-02-18 DIAGNOSIS — Z952 Presence of prosthetic heart valve: Secondary | ICD-10-CM

## 2017-02-18 MED ORDER — CLONIDINE HCL 0.1 MG PO TABS
ORAL_TABLET | ORAL | Status: AC
  Administered 2017-02-18: 0.1 mg via ORAL
  Filled 2017-02-18: qty 1

## 2017-02-18 MED ORDER — ROPINIROLE HCL 1 MG PO TABS
2.0000 mg | ORAL_TABLET | Freq: Once | ORAL | Status: AC
Start: 2017-02-18 — End: 2017-02-18
  Administered 2017-02-18: 2 mg via ORAL
  Filled 2017-02-18: qty 2

## 2017-02-18 MED ORDER — CLONIDINE HCL 0.1 MG PO TABS
0.1000 mg | ORAL_TABLET | Freq: Once | ORAL | Status: AC
  Administered 2017-02-18: 0.1 mg via ORAL

## 2017-02-18 NOTE — Progress Notes (Signed)
Cardiology Office Note Date:  02/18/2017  Patient ID:  [name], DOB 06/15/1931, MRN [id number] PCP:  [name], [name], MD  Cardiologist:  Dr. Mariah MillingGollan, MD    Chief Complaint: ED follow up for elevated BP  History of Present Illness: [name] is a 83 y.o. male with history of aortic stenosis s/p SJM mechanical AVR in 2002 on Coumadin, nonobstructive CAD by cardiac cath in 2011 medically managed, chronic diastolic CHF, HTN, carotid bruit, asymptomatic bradycardia, RLS< anxiety/depression, OA s/p left total hip replacement on 03/21/2015 complicated by acute blood loss anemia with a hgb of 8 (baseline of 12), GI bleed in 03/2015 with hgb dropping to 5.1 s/p pRBC transfusion x2 leading to the temporary holding of Coumadin, medication intolerances with prior LE swelling that improved with holding of amlodipine as well as hydralazine, GERD, and obesity who presents for evaluation of elevated blood pressures.  patient was admitted in 03/2015 twice for GI bleeding s/p hip replacement. He required pRBC transfusion. His ASA and Coumadin were briefly held. This admission was complicated by developement of MRSA bacteremia. TTE done to evaluate for endocarditis that showed EF 60-65%, no RWMA, GR1DD, aortic valve was poorly visualized and unable to exclude vegetation. Transvalvular velocity was increased. Mechanical prosthesis was present. Mild to moderate MR. Mild biatrial enlargement. Moderate TR. PASP 65 mm Hg. Secondly, he was noted to have developed an incidental left upper extremity DVT. EGD during admission showed gastritis, no ulcer. He was restarted on Coumadin without further issue.   Last ischemic evaluation via cardiac cath in 2011 that showed 20% stensosis of proximal LAD, 40% mid LAd stenosis. Prior 48 hour Holter monitor in 12/2015 for fatigue/PACs showed NSR, average heart rate 70 bpm, maximum heart rate 114 bpm, minimum heart rate 58 bpm at 3 AM, frequent PACs, coupelts,  bigeminy with short runs with the longest consisting of 5 beats (12% of all beats), rare PVC. Most recent echo from 12/2015 showed EF 60-65%, normal wall motion, GR1DD, mechanical AVR that was functioning well without significant stenosis, mild MR, PASP normal.   He was most recently seen by Dr. Mariah MillingGollan on 12/08/2016. BP at that time was noted to be 162/72. Heart rate 58 bpm. His Imdur was increased to 30 mg bid. He was continued on remaining medications at current dose. His chronic mild SOB was stable and did not require further work up.   Patient contacted neurology on 3/21 noting blurred vision x 2 months. Not felt to be related to Requip. Patient called the office on 3/26 with complaints of elevated blood pressure readings. Readings at home were noted to be 223/91 followed by a recheck of 217/87. He also noted blurred vision as above, dizziness, and weakness. At that time he was taking Cardura 8 mg bid, valsartan 320 mg daily, Toprol XL 25 mg daily, and Imdur 30 mg daily. His Imdur was increased to 90 mg daily and his Toprol was increased to 50 mg daily given a resting heart rate in the mid 60s bpm.   Patient [facility] ED overnight 2/2 continued elevation of his BP. In the ED he was noted to have BP of 194/82 with heart rate of 62 bpm. Labs showed negative troponin x 1. WBC 7.0, hgb 12.3, plt 231, SCr 0.93, K+ 4.6, Na 130. CXR not performed. CT head not performed. EKG showed NSR, 60 bpm, nonspecific anterior st/t changes c/w early repolarization. He was given clonidine 0.1 mg in the ED and discharged  home. This morning upon waking up his BP was noted to be 214/96, though he had not taken any anti-hypertensive medication. Upon taking his hypertensive medication blood pressure improved to the 160s systolic. Patient reports he has not yet increased his Imdur dose or metoprolol dose. As of today patient is taking Cardura 8 mg twice a day, Lasix 20 mg daily when necessary lower extremity swelling (has  not taken any), Toprol 25 mg daily, Imdur 30 mg twice a day, valsartan 20 mg daily.  Patient comes in today stating he still notes occasional dizziness and generalized fatigue with intermittent blurred vision only noted when he is attempting to read small/fine print along the newspaper. States he recently got new glasses. Patient has previously over the years had issues with labile blood pressure consistent with what he is seeing over these past several days. He reports he is uncertain if his blood pressure routinely runs high as he only checks it when he is feeling poorly. There is some confusion regarding his antihypertensive medication. Currently without any chest pain, headache, vision changes, or focal deficit. Blood pressure in our office this afternoon is 132/72 with a heart rate of 64 BPM.  ED MD note not completed for review at the time of his office visit.    Past Medical History:  Diagnosis Date  . Anxiety 10/11  . Aortic stenosis    a. s/p mechcanical AVR, 2002  . Bradycardia    chronic, no symptoms 07/2010  . C. difficile colitis   . Carotid bruit    dopplers in past, no abnormalities  . Chronic diastolic CHF (congestive heart failure) (HCC)    a. echo 03/2015: EF 60-65%, no RWMA, GR1DD, aortic valve poorly visualized, unable to excluded vegetation, increased transvalvular velocity, mechanical valve is present, mild biatrial enalrgement, mild to mod MR, mod TR, PASP 65 mm Hg  . Coronary artery disease    a. mild, cath, 08/2010; b. medically managed  . Decreased hearing    Right ear  . Depression   . Gastric ulcer   . GERD (gastroesophageal reflux disease)   . Hypertension    BP higher than usual 04/19/10; amlodipine increased by telephone  . RLS (restless legs syndrome) 08/23/2015  . S/P AVR    a. St. Jude. mechanical 2002; b. echo 08/2010 EF 60%, trival AI, mild MR, AVR working well; c. on longterm warfarin tx  . SOB (shortness of breath) 10/11   08/2010,Episodes at 5 AM,  eventually felt to be anxiety, after complete workup including catheterization, pt greatly improved with anxiety meds 11/11    Past Surgical History:  Procedure Laterality Date  . CARDIAC CATHETERIZATION    . ESOPHAGOGASTRODUODENOSCOPY N/A 04/05/2015   Procedure: ESOPHAGOGASTRODUODENOSCOPY (EGD);  Surgeon: Scot Jun, MD;  Location[facility] ENDOSCOPY;  Service: Endoscopy;  Laterality: N/A;  . ESOPHAGOGASTRODUODENOSCOPY N/A 04/17/2015   Procedure: ESOPHAGOGASTRODUODENOSCOPY (EGD);  Surgeon: Scot Jun, MD;  Location: [facility] ENDOSCOPY;  Service: Endoscopy;  Laterality: N/A;  . ESOPHAGOGASTRODUODENOSCOPY N/A 08/02/2015   Procedure: ESOPHAGOGASTRODUODENOSCOPY (EGD);  Surgeon: Wallace Cullens, MD;  Location: [facility] ENDOSCOPY;  Service: Endoscopy;  Laterality: N/A;  . HERNIA REPAIR    . JOINT REPLACEMENT    . TOTAL HIP ARTHROPLASTY    . VALVE REPLACEMENT  1/02   Aortic; echo 3/09 valve working well; echo 10/11 working well; put on Coumadin    Current Outpatient Prescriptions  Medication Sig Dispense Refill  . acetaminophen (TYLENOL) 500 MG tablet Take 500 mg by mouth every 4 (  four) hours as needed for mild pain or headache.     . ALPRAZolam (XANAX) 0.5 MG tablet Take 0.5 tablets (0.25 mg total) by mouth daily as needed for anxiety. (Patient taking differently: Take 0.5 mg by mouth daily as needed for anxiety. ) 10 tablet 0  . Cetirizine HCl 10 MG CAPS Take 10 mg by mouth daily.    . citalopram (CELEXA) 20 MG tablet Take 20 mg by mouth daily.    Marland Kitchen doxazosin (CARDURA) 8 MG tablet Take 1 tablet (8 mg total) by mouth 2 (two) times daily. 180 tablet 3  . finasteride (PROSCAR) 5 MG tablet Take 5 mg by mouth daily.      . furosemide (LASIX) 20 MG tablet Take 1 tablet (20 mg total) by mouth daily as needed. 30 tablet 6  . isosorbide mononitrate (IMDUR) 30 MG 24 hr tablet Take 1 tablet (30 mg total) by mouth 3 (three) times daily. 270 tablet 3  . meloxicam (MOBIC) 7.5 MG tablet Take 7.5 mg by mouth as  needed.    . metoprolol succinate (TOPROL-XL) 50 MG 24 hr tablet Take 1 tablet (50 mg total) by mouth daily. Take with or immediately following a meal. 90 tablet 3  . Multiple Vitamin (MULTIVITAMIN WITH MINERALS) TABS tablet Take 1 tablet by mouth daily.    . pantoprazole (PROTONIX) 40 MG tablet Take 1 tablet (40 mg total) by mouth daily. 30 tablet 2  . rOPINIRole (REQUIP) 2 MG tablet Take 1 tablet by mouth 4  times daily 360 tablet 3  . Tamsulosin HCl (FLOMAX) 0.4 MG CAPS Take 0.4 mg by mouth at bedtime.     . traZODone (DESYREL) 50 MG tablet Take 50 mg by mouth at bedtime.    . valsartan (DIOVAN) 320 MG tablet Take 320 mg by mouth daily.      Marland Kitchen warfarin (COUMADIN) 5 MG tablet Take as directed by [facility] 150 tablet 1   No current facility-administered medications for this visit.     Allergies:   Levofloxacin and Sulfa antibiotics   Social History:  The patient  reports that he has quit smoking. He has never used smokeless tobacco. He reports that he drinks about 1.8 oz of alcohol per week . He reports that he does not use drugs.   Family History:  The patient's family history includes Diabetes type II in his mother; Heart attack in his mother; Heart disease in his father and mother; Hypertension in his father and mother; Stroke in his brother, brother, and father.  ROS:   Review of Systems  Constitutional: Positive for malaise/fatigue. Negative for chills, diaphoresis, fever and weight loss.  HENT: Negative for congestion.   Eyes: Negative for blurred vision, discharge and redness.       Blurred vision resolved  Respiratory: Negative for cough, hemoptysis, sputum production, shortness of breath and wheezing.   Cardiovascular: Negative for chest pain, palpitations, orthopnea, claudication, leg swelling and PND.  Gastrointestinal: Negative for abdominal pain, blood in stool, heartburn, melena, nausea and vomiting.  Genitourinary: Negative for hematuria.  Musculoskeletal: Negative  for falls and myalgias.  Skin: Negative for rash.  Neurological: Positive for dizziness and weakness. Negative for tingling, tremors, sensory change, speech change, focal weakness and loss of consciousness.  Endo/Heme/Allergies: Does not bruise/bleed easily.  Psychiatric/Behavioral: Negative for substance abuse. The patient is not nervous/anxious.   All other systems reviewed and are negative.    PHYSICAL EXAM:  VS:  BP 132/72 (BP Location: Left Arm, Patient Position:  Sitting, Cuff Size: Normal)   Pulse 64   Ht 5\' 11"  (1.803 m)   Wt 199 lb 12 oz (90.6 kg)   BMI 27.86 kg/m  BMI: Body mass index is 27.86 kg/m.  Physical Exam  Constitutional: He is oriented to person, place, and time. He appears well-developed and well-nourished.  HENT:  Head: Normocephalic and atraumatic.  Eyes: Right eye exhibits no discharge. Left eye exhibits no discharge.  Neck: Normal range of motion. No JVD present.  Cardiovascular: Normal rate, regular rhythm, S1 normal and S2 normal.  Exam reveals no distant heart sounds, no friction rub, no midsystolic click and no opening snap.   Murmur heard.  Systolic murmur is present with a grade of 2/6  Consistent with metallic aortic valve Pulmonary/Chest: Effort normal and breath sounds normal. No respiratory distress. He has no decreased breath sounds. He has no wheezes. He has no rales. He exhibits no tenderness.  Abdominal: Soft. He exhibits no distension. There is no tenderness.  Musculoskeletal: He exhibits no edema.  Neurological: He is alert and oriented to person, place, and time.  Skin: Skin is warm and dry. No cyanosis. Nails show no clubbing.  Psychiatric: He has a normal mood and affect. His speech is normal and behavior is normal. Judgment and thought content normal.     EKG:  Was not ordered today.  Recent Labs: 02/17/2017: BUN 24; Creatinine, Ser 0.93; Hemoglobin 12.3; Platelets 231; Potassium 4.6; Sodium 130  No results found for requested labs  within last 8760 hours.   Estimated Creatinine Clearance: 69.3 mL/min (by C-G formula based on SCr of 0.93 mg/dL).   Wt Readings from Last 3 Encounters:  02/18/17 199 lb 12 oz (90.6 kg)  02/17/17 200 lb (90.7 kg)  12/09/16 200 lb (90.7 kg)     Other studies reviewed: Additional studies/records reviewed today include: summarized above  ASSESSMENT AND PLAN:  1. Accelerated hypertension: Blood pressure currently well controlled at 132/72, though I suspect this is in the setting of the dose of clonidine he received in the ER early this morning. Given his heart rate that typically runs in the mid 60s I doubt there will be room for titration of metoprolol along with the long-standing addition of clonidine. I also am hesitant to place patient on long-standing clonidine given the adverse effects generally associated with clonidine to include generalized malaise, fatigue, and further reduction in heart rate. There is some confusion on patient's behalf regarding his antihypertensive regimen. This was discussed with him and his daughter multiple times in detail during her office visit today. Moving forward, patient is to review his medications at home and give [place] a call with exactly what milligram dosage she is taking each antihypertensive medication. Given his well-controlled blood pressure at current time and patient has not yet made medication changes that were discussed over the phone on 3/26 we will keep his medications as follows: Cardura 8 mg twice a day, Lasix 20 mg when necessary lower extremity swelling, Imdur 30 mg 3 times a day, Toprol-XL 50 mg daily (patient has residual 25 mg tabs he will take 2 of these once a day until he runs out followed by picking up his new prescription which will be 50 mg he will take one of these a day, this was discussed in detail multiple times with the patient and his daughter. They both understand these directions), valsartan 320 mg daily. Patient and patient's daughter  our concern for possible secondary hypertension, I doubt this is in  play given patient's age, history of hypertension, and times with well-controlled blood pressure. Given this, we will not pursue renal artery ultrasound at this time. Patient and daughter would like for routine blood work to be sent off. I will check TSH, cortisol level, renin/angiotensin ratio. I suspect some of his blood pressure fluctuations are secondary to atherosclerosis of the arteries as well as some component of diastolic dysfunction. We'll check echocardiogram to evaluate diastolic function and right-sided pressure given prior echocardiogram demonstrating moderate to severe pulmonary hypertension in 03/2015 that subsequently improved by echocardiogram in 12/2015. If it is found patient has elevated right-sided pressure may need to adjust patient's Lasix dose though must be cautious given his history of hyponatremia on higher doses of Lasix.  2. Nonobstructive CAD: No symptoms of angina. No further evaluation needed at this time. Continue Coumadin and place of ASA given mechanical aortic valve.  3. Aortic stenosis status post St. Jude medical mechanical aortic valve: Most recent echo from 12/2015 demonstrated normal valve function and velocities. Check echocardiogram as above given elevated right-sided pressure.   Disposition: F/u with Dr. Mariah Milling in 1 month  Current medicines are reviewed at length with the patient today.  The patient did not have any concerns regarding medicines.  Elinor Dodge PA-C 02/18/2017 3:24 PM     [facility] [address] [place], [place] [address] [phone]

## 2017-02-18 NOTE — ED Provider Notes (Signed)
[facility] Emergency Department Provider Note    I have reviewed the triage vital signs and the nursing notes.   HISTORY  Chief Complaint Hypertension   HPI [name] is a 83 y.o. male presents to the emergency department with elevated blood pressure 1 week. Patient states blood pressure at home exceeded 200 systolic. On arrival to the emergency department patient's BP 194/82. Patient denies any accompanying symptoms no chest pain shortness of breath nausea vomiting headache. Patient denies any change in vision no weakness numbness or gait instability. Patient stated that he contacted Dr. Mariah Milling and his cardiologist last night who informed him to double his dose of Diovan which she did before presentation.   Past Medical History:  Diagnosis Date  . Anxiety 10/11  . Aortic stenosis    a. s/p mechcanical AVR, 2002  . Bradycardia    chronic, no symptoms 07/2010  . C. difficile colitis   . Carotid bruit    dopplers in past, no abnormalities  . Chronic diastolic CHF (congestive heart failure) (HCC)    a. echo 03/2015: EF 60-65%, no RWMA, GR1DD, aortic valve poorly visualized, unable to excluded vegetation, increased transvalvular velocity, mechanical valve is present, mild biatrial enalrgement, mild to mod MR, mod TR, PASP 65 mm Hg  . Coronary artery disease    a. mild, cath, 08/2010; b. medically managed  . Decreased hearing    Right ear  . Depression   . Gastric ulcer   . GERD (gastroesophageal reflux disease)   . Hypertension    BP higher than usual 04/19/10; amlodipine increased by telephone  . RLS (restless legs syndrome) 08/23/2015  . S/P AVR    a. [employer]. mechanical 2002; b. echo 08/2010 EF 60%, trival AI, mild MR, AVR working well; c. on longterm warfarin tx  . SOB (shortness of breath) 10/11   08/2010,Episodes at 5 AM, eventually felt to be anxiety, after complete workup including catheterization, pt greatly improved with anxiety meds  11/11    Patient Active Problem List   Diagnosis Date Noted  . Bilateral leg edema 09/25/2015  . RLS (restless legs syndrome) 08/23/2015  . GIB (gastrointestinal bleeding) 07/31/2015  . Bleeding gastric ulcer 05/26/2015  . MRSA bacteremia 05/26/2015  . C. difficile colitis 05/26/2015  . GI bleed 04/16/2015  . Blood loss anemia   . Bleeding gastrointestinal   . S/P AVR (aortic valve replacement)   . Anemia 04/02/2015  . GERD (gastroesophageal reflux disease)   . Bradycardia   . Depression   . Anxiety   . SOB (shortness of breath)   . Hypertension   . Decreased hearing   . Coronary artery disease   . Aortic stenosis   . S/P AVR   . Warfarin anticoagulation   . Ejection fraction   . Carotid bruit     Past Surgical History:  Procedure Laterality Date  . CARDIAC CATHETERIZATION    . ESOPHAGOGASTRODUODENOSCOPY N/A 04/05/2015   Procedure: ESOPHAGOGASTRODUODENOSCOPY (EGD);  Surgeon: Scot Jun, MD;  Location: [facility] ENDOSCOPY;  Service: Endoscopy;  Laterality: N/A;  . ESOPHAGOGASTRODUODENOSCOPY N/A 04/17/2015   Procedure: ESOPHAGOGASTRODUODENOSCOPY (EGD);  Surgeon: Scot Jun, MD;  Location: [facility] ENDOSCOPY;  Service: Endoscopy;  Laterality: N/A;  . ESOPHAGOGASTRODUODENOSCOPY N/A 08/02/2015   Procedure: ESOPHAGOGASTRODUODENOSCOPY (EGD);  Surgeon: Wallace Cullens, MD;  Location: [facility] ENDOSCOPY;  Service: Endoscopy;  Laterality: N/A;  . HERNIA REPAIR    . JOINT REPLACEMENT    . TOTAL HIP ARTHROPLASTY    .  VALVE REPLACEMENT  1/02   Aortic; echo 3/09 valve working well; echo 10/11 working well; put on Coumadin    Prior to Admission medications   Medication Sig Start Date End Date Taking? Authorizing Provider  acetaminophen (TYLENOL) 500 MG tablet Take 500 mg by mouth every 4 (four) hours as needed for mild pain or headache.     Historical Provider, MD  ALPRAZolam Prudy Feeler) 0.5 MG tablet Take 0.5 tablets (0.25 mg total) by mouth daily as needed for anxiety. Patient taking  differently: Take 0.5 mg by mouth daily as needed for anxiety.  04/20/15   Auburn Bilberry, MD  Cetirizine HCl 10 MG CAPS Take 10 mg by mouth daily.    Historical Provider, MD  citalopram (CELEXA) 20 MG tablet Take 20 mg by mouth daily.    Historical Provider, MD  doxazosin (CARDURA) 8 MG tablet Take 1 tablet (8 mg total) by mouth 2 (two) times daily. 10/22/16   Antonieta Iba, MD  finasteride (PROSCAR) 5 MG tablet Take 5 mg by mouth daily.      Historical Provider, MD  furosemide (LASIX) 20 MG tablet Take 1 tablet (20 mg total) by mouth daily as needed. 09/25/15   Antonieta Iba, MD  isosorbide mononitrate (IMDUR) 30 MG 24 hr tablet Take 1 tablet (30 mg total) by mouth 3 (three) times daily. 02/17/17 05/18/17  Raymon Mutton Dunn, PA-C  meloxicam (MOBIC) 7.5 MG tablet Take 7.5 mg by mouth as needed. 09/10/16   Historical Provider, MD  metoprolol succinate (TOPROL-XL) 50 MG 24 hr tablet Take 1 tablet (50 mg total) by mouth daily. Take with or immediately following a meal. 02/17/17 05/18/17  Sondra Barges, PA-C  Multiple Vitamin (MULTIVITAMIN WITH MINERALS) TABS tablet Take 1 tablet by mouth daily.    Historical Provider, MD  pantoprazole (PROTONIX) 40 MG tablet Take 1 tablet (40 mg total) by mouth daily. 08/07/15   Altamese Dilling, MD  rOPINIRole (REQUIP) 2 MG tablet Take 1 tablet by mouth 4  times daily 10/14/16   Butch Penny, NP  Tamsulosin HCl (FLOMAX) 0.4 MG CAPS Take 0.4 mg by mouth at bedtime.     Historical Provider, MD  traZODone (DESYREL) 50 MG tablet Take 50 mg by mouth at bedtime.    Historical Provider, MD  valsartan (DIOVAN) 320 MG tablet Take 320 mg by mouth daily.      Historical Provider, MD  warfarin (COUMADIN) 5 MG tablet Take as directed by [facility] 07/09/16   Antonieta Iba, MD    Allergies Levofloxacin and Sulfa antibiotics  Family History  Problem Relation Age of Onset  . Hypertension Mother   . Diabetes type II Mother   . Heart disease Mother   . Heart attack  Mother   . Hypertension Father   . Heart disease Father   . Stroke Father   . Stroke Brother   . Stroke Brother   . Hypertension      Social History Social History  Substance Use Topics  . Smoking status: Former Games developer  . Smokeless tobacco: Never Used  . Alcohol use 1.8 oz/week    3 Glasses of wine per week     Comment: Social    Review of Systems Constitutional: No fever/chills Eyes: No visual changes. ENT: No sore throat. Cardiovascular: Denies chest pain. Respiratory: Denies shortness of breath. Gastrointestinal: No abdominal pain.  No nausea, no vomiting.  No diarrhea.  No constipation. Genitourinary: Negative for dysuria. Musculoskeletal: Negative for back pain. Skin: Negative  for rash. Neurological: Negative for headaches, focal weakness or numbness.  10-point ROS otherwise negative.  ____________________________________________   PHYSICAL EXAM:  VITAL SIGNS: ED Triage Vitals [02/17/17 2238]  Enc Vitals Group     BP (!) 194/82     Pulse Rate 62     Resp 20     Temp 98.3 F (36.8 C)     Temp Source Oral     SpO2 95 %     Weight 200 lb (90.7 kg)     Height 5\' 11"  (1.803 m)     Head Circumference      Peak Flow      Pain Score      Pain Loc      Pain Edu?      Excl. in GC?     Constitutional: Alert and oriented. Well appearing and in no acute distress. Eyes: Conjunctivae are normal. PERRL. EOMI. Head: Atraumatic. Mouth/Throat: Mucous membranes are moist. Neck: No stridor.   Cardiovascular: Normal rate, regular rhythm. Good peripheral circulation. Grossly normal heart sounds. Respiratory: Normal respiratory effort.  No retractions. Lungs CTAB. Gastrointestinal: Soft and nontender. No distention.  Musculoskeletal: No lower extremity tenderness nor edema. No gross deformities of extremities. Neurologic:  Normal speech and language. No gross focal neurologic deficits are appreciated.  Skin:  Skin is warm, dry and intact. No rash noted. Psychiatric:  Mood and affect are normal. Speech and behavior are normal.  ____________________________________________   LABS (all labs ordered are listed, but only abnormal results are displayed)  Labs Reviewed  BASIC METABOLIC PANEL - Abnormal; Notable for the following:       Result Value   Sodium 130 (*)    Chloride 97 (*)    Glucose, Bld 102 (*)    BUN 24 (*)    All other components within normal limits  CBC - Abnormal; Notable for the following:    RBC 4.23 (*)    Hemoglobin 12.3 (*)    HCT 36.4 (*)    RDW 15.9 (*)    All other components within normal limits  TROPONIN I   ____________________________________________  EKG  ED ECG REPORT I, [place] Hiep Ollis, the attending physician, personally viewed and interpreted this ECG.   Date: 02/18/2017  EKG Time: 10:54 PM  Rate: 60  Rhythm: Normal sinus rhythm  Axis: Normal  Intervals: Normal  ST&T Change: None   Procedures   ____________________________________________   INITIAL IMPRESSION / ASSESSMENT AND PLAN / ED COURSE  Pertinent labs & imaging results that were available during my care of the patient were reviewed by me and considered in my medical decision making (see chart for details).  83 year old male presenting with asymptomatic elevated blood pressure 1 week. Patient and his daughter states that his blood pressure has been uncontrolled for quite some time despite changes in management. Patient with no evidence of end organ involvement secondary to hypertension. Patient given clonidine 0.1 mg in the emergency Department with improvement of blood pressure BP before discharge 169/58.      ____________________________________________  FINAL CLINICAL IMPRESSION(S) / ED DIAGNOSES  Final diagnoses:  Hypertension, unspecified type     MEDICATIONS GIVEN DURING THIS VISIT:  Medications  cloNIDine (CATAPRES) tablet 0.1 mg (0.1 mg Oral Given 02/18/17 0310)  rOPINIRole (REQUIP) tablet 2 mg (2 mg Oral Given 02/18/17  0429)     NEW OUTPATIENT MEDICATIONS STARTED DURING THIS VISIT:  Discharge Medication List as of 02/18/2017  5:15 AM      Discharge Medication  List as of 02/18/2017  5:15 AM      Discharge Medication List as of 02/18/2017  5:15 AM       Note:  This document was prepared using Dragon voice recognition software and may include unintentional dictation errors.    Darci Current, MD 02/18/17 [phone]

## 2017-02-18 NOTE — Telephone Encounter (Signed)
Pt in ED this morning for elevated BP. He was given clonidine, advised to f/u w/cardiology and discharged home at 5:30am. Reports BP remains elevated however, he has not taken his morning BP medications. Advised pt to take medications now and recheck BP in 1.5 hours. If BP not improved, he will call back for further instructions. Scheduled pt appt w/Ryan Dunn, PA-C today at 2pm.  Pt verbalized understanding and is agreeable with plan.

## 2017-02-18 NOTE — ED Notes (Signed)
Pt discharged to home.  Family member driving.  Discharge instructions reviewed.  Verbalized understanding.  No questions or concerns at this time.  Teach back verified.  Pt in NAD.  No items left in ED.   

## 2017-02-18 NOTE — Telephone Encounter (Signed)
Pt states he went to the ED last night. He states his BP this morning is still elevated . States he has not take any medication this morning. States his BP is 214/96. States the ED physician told him to call our office this morning.

## 2017-02-18 NOTE — Patient Instructions (Addendum)
Medication Instructions:  Your physician has recommended you make the following change in your medication:  1. Isosorbide mononitrate 30 mg Three times a day 2. Metoprolol 25 mg Take 2 tablets until the bottle is empty. Then pick up new prescription of Metoprolol 50 mg Take 1 tablet daily.   Labwork: We will call you with these results.   Testing/Procedures: Your physician has requested that you have an echocardiogram. Echocardiography is a painless test that uses sound waves to create images of your heart. It provides your doctor with information about the size and shape of your heart and how well your heart's chambers and valves are working. This procedure takes approximately one hour. There are no restrictions for this procedure.  Scheduled for Friday Mach 30, 2018 at 3:00PM  Follow-Up: Your physician recommends that you schedule a follow-up appointment in: 1 month with Dr. Mariah MillingGollan or Eula Listenyan Dunn PA  It was a pleasure seeing you today here in the office. Please do not hesitate to give us a call back if you have any further questions. [phone]  Fairview CellarPamela A. RN, BSN    Echocardiogram An echocardiogram, or echocardiography, uses sound waves (ultrasound) to produce an image of your heart. The echocardiogram is simple, painless, obtained within a short period of time, and offers valuable information to your health care provider. The images from an echocardiogram can provide information such as:  Evidence of coronary artery disease (CAD).  Heart size.  Heart muscle function.  Heart valve function.  Aneurysm detection.  Evidence of a past heart attack.  Fluid buildup around the heart.  Heart muscle thickening.  Assess heart valve function. Tell a health care provider about:  Any allergies you have.  All medicines you are taking, including vitamins, herbs, eye drops, creams, and over-the-counter medicines.  Any problems you or family members have had with anesthetic  medicines.  Any blood disorders you have.  Any surgeries you have had.  Any medical conditions you have.  Whether you are pregnant or may be pregnant. What happens before the procedure? No special preparation is needed. Eat and drink normally. What happens during the procedure?  In order to produce an image of your heart, gel will be applied to your chest and a wand-like tool (transducer) will be moved over your chest. The gel will help transmit the sound waves from the transducer. The sound waves will harmlessly bounce off your heart to allow the heart images to be captured in real-time motion. These images will then be recorded.  You may need an IV to receive a medicine that improves the quality of the pictures. What happens after the procedure? You may return to your normal schedule including diet, activities, and medicines, unless your health care provider tells you otherwise. This information is not intended to replace advice given to you by your health care provider. Make sure you discuss any questions you have with your health care provider. Document Released: 11/08/2000 Document Revised: 06/29/2016 Document Reviewed: 07/19/2013 Elsevier Interactive Patient Education  2017 [employer]

## 2017-02-21 ENCOUNTER — Ambulatory Visit (INDEPENDENT_AMBULATORY_CARE_PROVIDER_SITE_OTHER): Payer: Medicare Other

## 2017-02-21 ENCOUNTER — Other Ambulatory Visit: Payer: Self-pay

## 2017-02-21 DIAGNOSIS — R0602 Shortness of breath: Secondary | ICD-10-CM

## 2017-02-21 LAB — ALDOSTERONE + RENIN ACTIVITY W/ RATIO
ALDOS/RENIN RATIO: 1.5 (ref 0.0–30.0)
ALDOSTERONE: 1 ng/dL (ref 0.0–30.0)
RENIN: 0.689 ng/mL/h (ref 0.167–5.380)

## 2017-02-21 LAB — CORTISOL: Cortisol: 11.2 ug/dL

## 2017-02-21 LAB — TSH: TSH: 3.09 u[IU]/mL (ref 0.450–4.500)

## 2017-02-22 ENCOUNTER — Encounter: Payer: Self-pay | Admitting: Emergency Medicine

## 2017-02-22 ENCOUNTER — Emergency Department
Admission: EM | Admit: 2017-02-22 | Discharge: 2017-02-22 | Disposition: A | Discharge status: Home / Self Care | Payer: Medicare Other | Attending: Emergency Medicine | Admitting: Emergency Medicine

## 2017-02-22 DIAGNOSIS — I110 Hypertensive heart disease with heart failure: Secondary | ICD-10-CM | POA: Insufficient documentation

## 2017-02-22 DIAGNOSIS — Z87891 Personal history of nicotine dependence: Secondary | ICD-10-CM | POA: Insufficient documentation

## 2017-02-22 DIAGNOSIS — I2510 Atherosclerotic heart disease of native coronary artery without angina pectoris: Secondary | ICD-10-CM | POA: Insufficient documentation

## 2017-02-22 DIAGNOSIS — Z79899 Other long term (current) drug therapy: Secondary | ICD-10-CM | POA: Diagnosis not present

## 2017-02-22 DIAGNOSIS — I10 Essential (primary) hypertension: Secondary | ICD-10-CM

## 2017-02-22 DIAGNOSIS — I5032 Chronic diastolic (congestive) heart failure: Secondary | ICD-10-CM | POA: Insufficient documentation

## 2017-02-22 LAB — COMPREHENSIVE METABOLIC PANEL
ALK PHOS: 67 U/L (ref 38–126)
ALT: 15 U/L — AB (ref 17–63)
AST: 23 U/L (ref 15–41)
Albumin: 4.3 g/dL (ref 3.5–5.0)
Anion gap: 6 (ref 5–15)
BILIRUBIN TOTAL: 0.9 mg/dL (ref 0.3–1.2)
BUN: 20 mg/dL (ref 6–20)
CALCIUM: 8.9 mg/dL (ref 8.9–10.3)
CO2: 27 mmol/L (ref 22–32)
CREATININE: 0.84 mg/dL (ref 0.61–1.24)
Chloride: 96 mmol/L — ABNORMAL LOW (ref 101–111)
GFR calc Af Amer: >60 mL/min (ref >60)
GFR calc non Af Amer: >60 mL/min (ref >60)
GLUCOSE: 94 mg/dL (ref 65–99)
Potassium: 4.5 mmol/L (ref 3.5–5.1)
Sodium: 129 mmol/L — ABNORMAL LOW (ref 135–145)
TOTAL PROTEIN: 6.9 g/dL (ref 6.5–8.1)

## 2017-02-22 LAB — CBC WITH DIFFERENTIAL/PLATELET
BASOS ABS: 0 10*3/uL (ref 0–0.1)
BASOS PCT: 0 %
EOS PCT: 3 %
Eosinophils Absolute: 0.2 10*3/uL (ref 0–0.7)
HCT: 36.5 % — ABNORMAL LOW (ref 40.0–52.0)
Hemoglobin: 12.3 g/dL — ABNORMAL LOW (ref 13.0–18.0)
Lymphocytes Relative: 19 %
Lymphs Abs: 1.1 10*3/uL (ref 1.0–3.6)
MCH: 29.2 pg (ref 26.0–34.0)
MCHC: 33.8 g/dL (ref 32.0–36.0)
MCV: 86.5 fL (ref 80.0–100.0)
MONO ABS: 0.5 10*3/uL (ref 0.2–1.0)
Monocytes Relative: 9 %
Neutro Abs: 4.1 10*3/uL (ref 1.4–6.5)
Neutrophils Relative %: 69 %
Platelets: 233 10*3/uL (ref 150–440)
RBC: 4.22 MIL/uL — ABNORMAL LOW (ref 4.40–5.90)
RDW: 15.7 % — AB (ref 11.5–14.5)
WBC: 6 10*3/uL (ref 3.8–10.6)

## 2017-02-22 LAB — TROPONIN I: Troponin I: <0.03 ng/mL (ref <0.03)

## 2017-02-22 MED ORDER — CLONIDINE HCL 0.1 MG PO TABS: 0.1000 mg | ORAL_TABLET | Freq: Two times a day (BID) | ORAL | 0 refills | Status: DC

## 2017-02-22 MED ORDER — CLONIDINE HCL 0.1 MG PO TABS
0.1000 mg | ORAL_TABLET | Freq: Once | ORAL | Status: AC
  Administered 2017-02-22: 0.1 mg via ORAL
  Filled 2017-02-22: qty 1

## 2017-02-22 NOTE — ED Notes (Signed)
Consulted with Dr. Cyril Loosen. Orders received to obtain CBC/CMP/Troponin lab work.

## 2017-02-22 NOTE — ED Provider Notes (Signed)
[facility] Emergency Department Provider Note  ____________________________________________  Time seen: Approximately 6:34 PM  I have reviewed the triage vital signs and the nursing notes.   HISTORY  Chief Complaint Hypertension   HPI [name] is a 83 y.o. male history of diastolic CHF (normal EF on Echo 01/2017), aortic valve replacement on coumadin, CAD, diastolic CHF, HTN who presents for evaluation of elevated blood pressure. Patient was seen here 4 days ago for similar complaint. He followed up with his cardiologist and had his medications adjusted. He has been taking the new regimen and continues to have persistent elevated blood pressure. He reports that over the last few days his blood pressure usually decreases after he takes the medication however today it has not. He reports generalized weakness when the BP is high, he denies headache, lightheadedness, vertigo, unilateral weakness or numbness, facial droop, chest pain. He does endorse DOE which he has had for multiple months usually present when he exerts himself too much. No shortness of breath at rest.  Past Medical History:  Diagnosis Date  . Anxiety 10/11  . Aortic stenosis    a. s/p mechcanical AVR, 2002  . Bradycardia    chronic, no symptoms 07/2010  . C. difficile colitis   . Carotid bruit    dopplers in past, no abnormalities  . Chronic diastolic CHF (congestive heart failure) (HCC)    a. echo 03/2015: EF 60-65%, no RWMA, GR1DD, aortic valve poorly visualized, unable to excluded vegetation, increased transvalvular velocity, mechanical valve is present, mild biatrial enalrgement, mild to mod MR, mod TR, PASP 65 mm Hg  . Coronary artery disease    a. mild, cath, 08/2010; b. medically managed  . Decreased hearing    Right ear  . Depression   . Gastric ulcer   . GERD (gastroesophageal reflux disease)   . Hypertension    BP higher than usual 04/19/10; amlodipine increased by  telephone  . RLS (restless legs syndrome) 08/23/2015  . S/P AVR    a. St. Jude. mechanical 2002; b. echo 08/2010 EF 60%, trival AI, mild MR, AVR working well; c. on longterm warfarin tx  . SOB (shortness of breath) 10/11   08/2010,Episodes at 5 AM, eventually felt to be anxiety, after complete workup including catheterization, pt greatly improved with anxiety meds 11/11    Patient Active Problem List   Diagnosis Date Noted  . Bilateral leg edema 09/25/2015  . RLS (restless legs syndrome) 08/23/2015  . GIB (gastrointestinal bleeding) 07/31/2015  . Bleeding gastric ulcer 05/26/2015  . MRSA bacteremia 05/26/2015  . C. difficile colitis 05/26/2015  . GI bleed 04/16/2015  . Blood loss anemia   . Bleeding gastrointestinal   . S/P AVR (aortic valve replacement)   . Anemia 04/02/2015  . GERD (gastroesophageal reflux disease)   . Bradycardia   . Depression   . Anxiety   . SOB (shortness of breath)   . Hypertension   . Decreased hearing   . Coronary artery disease   . Aortic stenosis   . S/P AVR   . Warfarin anticoagulation   . Ejection fraction   . Carotid bruit     Past Surgical History:  Procedure Laterality Date  . CARDIAC CATHETERIZATION    . ESOPHAGOGASTRODUODENOSCOPY N/A 04/05/2015   Procedure: ESOPHAGOGASTRODUODENOSCOPY (EGD);  Surgeon: Scot Jun, MD;  Location: [facility] ENDOSCOPY;  Service: Endoscopy;  Laterality: N/A;  . ESOPHAGOGASTRODUODENOSCOPY N/A 04/17/2015   Procedure: ESOPHAGOGASTRODUODENOSCOPY (EGD);  Surgeon: Scot Jun, MD;  Location: [facility] ENDOSCOPY;  Service: Endoscopy;  Laterality: N/A;  . ESOPHAGOGASTRODUODENOSCOPY N/A 08/02/2015   Procedure: ESOPHAGOGASTRODUODENOSCOPY (EGD);  Surgeon: Wallace Cullens, MD;  Location: [facility] ENDOSCOPY;  Service: Endoscopy;  Laterality: N/A;  . HERNIA REPAIR    . JOINT REPLACEMENT    . TOTAL HIP ARTHROPLASTY    . VALVE REPLACEMENT  1/02   Aortic; echo 3/09 valve working well; echo 10/11 working well; put on Coumadin    Prior  to Admission medications   Medication Sig Start Date End Date Taking? Authorizing Provider  acetaminophen (TYLENOL) 500 MG tablet Take 500 mg by mouth every 4 (four) hours as needed for mild pain or headache.     Historical Provider, MD  ALPRAZolam Prudy Feeler) 0.5 MG tablet Take 0.5 tablets (0.25 mg total) by mouth daily as needed for anxiety. Patient taking differently: Take 0.5 mg by mouth daily as needed for anxiety.  04/20/15   Auburn Bilberry, MD  Cetirizine HCl 10 MG CAPS Take 10 mg by mouth daily.    Historical Provider, MD  citalopram (CELEXA) 20 MG tablet Take 20 mg by mouth daily.    Historical Provider, MD  cloNIDine (CATAPRES) 0.1 MG tablet Take 1 tablet (0.1 mg total) by mouth 2 (two) times daily. 02/22/17 02/22/18  Nita Sickle, MD  doxazosin (CARDURA) 8 MG tablet Take 1 tablet (8 mg total) by mouth 2 (two) times daily. 10/22/16   Antonieta Iba, MD  finasteride (PROSCAR) 5 MG tablet Take 5 mg by mouth daily.      Historical Provider, MD  furosemide (LASIX) 20 MG tablet Take 1 tablet (20 mg total) by mouth daily as needed. 09/25/15   Antonieta Iba, MD  isosorbide mononitrate (IMDUR) 30 MG 24 hr tablet Take 1 tablet (30 mg total) by mouth 3 (three) times daily. 02/17/17 05/18/17  Raymon Mutton Dunn, PA-C  meloxicam (MOBIC) 7.5 MG tablet Take 7.5 mg by mouth as needed. 09/10/16   Historical Provider, MD  metoprolol succinate (TOPROL-XL) 50 MG 24 hr tablet Take 1 tablet (50 mg total) by mouth daily. Take with or immediately following a meal. 02/17/17 05/18/17  Sondra Barges, PA-C  Multiple Vitamin (MULTIVITAMIN WITH MINERALS) TABS tablet Take 1 tablet by mouth daily.    Historical Provider, MD  pantoprazole (PROTONIX) 40 MG tablet Take 1 tablet (40 mg total) by mouth daily. 08/07/15   Altamese Dilling, MD  rOPINIRole (REQUIP) 2 MG tablet Take 1 tablet by mouth 4  times daily 10/14/16   Butch Penny, NP  Tamsulosin HCl (FLOMAX) 0.4 MG CAPS Take 0.4 mg by mouth at bedtime.     Historical  Provider, MD  traZODone (DESYREL) 50 MG tablet Take 50 mg by mouth at bedtime.    Historical Provider, MD  valsartan (DIOVAN) 320 MG tablet Take 320 mg by mouth daily.      Historical Provider, MD  warfarin (COUMADIN) 5 MG tablet Take as directed by [facility] 07/09/16   Antonieta Iba, MD    Allergies Levofloxacin and Sulfa antibiotics  Family History  Problem Relation Age of Onset  . Hypertension Mother   . Diabetes type II Mother   . Heart disease Mother   . Heart attack Mother   . Hypertension Father   . Heart disease Father   . Stroke Father   . Stroke Brother   . Stroke Brother   . Hypertension      Social History Social History  Substance Use Topics  . Smoking status: Former  Smoker  . Smokeless tobacco: Never Used  . Alcohol use 1.8 oz/week    3 Glasses of wine per week     Comment: Social    Review of Systems  Constitutional: Negative for fever. + generalized weakness Eyes: Negative for visual changes. ENT: Negative for sore throat. Neck: No neck pain  Cardiovascular: Negative for chest pain. Respiratory: + shortness of breath. Gastrointestinal: Negative for abdominal pain, vomiting or diarrhea. Genitourinary: Negative for dysuria. Musculoskeletal: Negative for back pain. Skin: Negative for rash. Neurological: Negative for headaches, weakness or numbness. Psych: No SI or HI  ____________________________________________   PHYSICAL EXAM:  VITAL SIGNS: ED Triage Vitals  Enc Vitals Group     BP 02/22/17 1737 (!) 213/64     Pulse Rate 02/22/17 1737 (!) 59     Resp 02/22/17 1737 18     Temp 02/22/17 1737 97.8 F (36.6 C)     Temp Source 02/22/17 1737 Oral     SpO2 02/22/17 1737 97 %     Weight 02/22/17 1738 199 lb (90.3 kg)     Height 02/22/17 1738  (1.803 m)     Head Circumference --      Peak Flow --      Pain Score --      Pain Loc --      Pain Edu? --      Excl. in GC? --     Constitutional: Alert and oriented. Well appearing  and in no apparent distress. HEENT:      Head: Normocephalic and atraumatic.         Eyes: Conjunctivae are normal. Sclera is non-icteric. EOMI. PERRL      Mouth/Throat: Mucous membranes are moist.       Neck: Supple with no signs of meningismus. Cardiovascular: Regular rate and rhythm. No murmurs, gallops, or rubs. 2+ symmetrical distal pulses are present in all extremities. No JVD. Respiratory: Normal respiratory effort. Lungs are clear to auscultation bilaterally. No wheezes, crackles, or rhonchi.  Gastrointestinal: Soft, non tender, and non distended with positive bowel sounds. No rebound or guarding. Genitourinary: No CVA tenderness. Musculoskeletal: Nontender with normal range of motion in all extremities. No edema, cyanosis, or erythema of extremities. Neurologic: Normal speech and language. A & O x3, PERRL, no nystagmus, CN II-XII intact, motor testing reveals good tone and bulk throughout. There is no evidence of pronator drift or dysmetria. Muscle strength is 5/5 throughout. Deep tendon reflexes are 2+ throughout with downgoing toes. Sensory examination is intact. Gait is normal. Skin: Skin is warm, dry and intact. No rash noted. Psychiatric: Mood and affect are normal. Speech and behavior are normal.  ____________________________________________   LABS (all labs ordered are listed, but only abnormal results are displayed)  Labs Reviewed  COMPREHENSIVE METABOLIC PANEL - Abnormal; Notable for the following:       Result Value   Sodium 129 (*)    Chloride 96 (*)    ALT 15 (*)    All other components within normal limits  CBC WITH DIFFERENTIAL/PLATELET - Abnormal; Notable for the following:    RBC 4.22 (*)    Hemoglobin 12.3 (*)    HCT 36.5 (*)    RDW 15.7 (*)    All other components within normal limits  TROPONIN I   ____________________________________________  EKG  ED ECG REPORT I, Nita Sickle, the attending physician, personally viewed and interpreted this  ECG.  Normal sinus rhythm, rate of 59, normal intervals, normal axis, no ST elevations  or depressions. Unchanged from prior  ____________________________________________  RADIOLOGY  none  ____________________________________________   PROCEDURES  Procedure(s) performed: None Procedures Critical Care performed:  None ____________________________________________   INITIAL IMPRESSION / ASSESSMENT AND PLAN / ED COURSE  83 y.o. male history of diastolic CHF (normal EF on Echo 01/2017), aortic valve replacement on coumadin, CAD, diastolic CHF, HTN who presents for evaluation of elevated blood pressure. Patient is extremely well-appearing with elevated blood pressure 213/64, is neurologically intact. His lungs are clear to auscultation with normal sats and normal work of breathing. Patient responded well to clonidine during his last visit so we'll restart patient on that medicine. We'll check blood work and EKG to rule out end organ injury.  Clinical Course as of Feb 22 2017  Sat Feb 22, 2017  2006 BP trending down. Patient remains asymptomatic. Labs and EKG with no evidence of end organ injury. Patient will be sent home on clonidine 0.1mg  and close f/u with cardiologist on Monday.  [CV]    Clinical Course User Index [CV] Nita Sickle, MD    Pertinent labs & imaging results that were available during my care of the patient were reviewed by me and considered in my medical decision making (see chart for details).    ____________________________________________   FINAL CLINICAL IMPRESSION(S) / ED DIAGNOSES  Final diagnoses:  Hypertension, unspecified type      NEW MEDICATIONS STARTED DURING THIS VISIT:  New Prescriptions   CLONIDINE (CATAPRES) 0.1 MG TABLET    Take 1 tablet (0.1 mg total) by mouth 2 (two) times daily.     Note:  This document was prepared using Dragon voice recognition software and may include unintentional dictation errors.    Nita Sickle,  MD 02/22/17 2018

## 2017-02-22 NOTE — ED Triage Notes (Signed)
Pt presents to ED c/o hypertension. Seen in this ED 3/27 for same and was sent home to follow-up with cardiology. Sent that visit pt reports changes to medications including increase in isosorbide and cardur doses. BP in triage 213/64. Pt denies any chest, SOB, vision changes, and pain at this time.

## 2017-02-24 ENCOUNTER — Ambulatory Visit (INDEPENDENT_AMBULATORY_CARE_PROVIDER_SITE_OTHER): Payer: Medicare Other | Admitting: Cardiovascular Disease

## 2017-02-24 ENCOUNTER — Telehealth: Payer: Self-pay | Admitting: Cardiovascular Disease

## 2017-02-24 ENCOUNTER — Encounter: Payer: Self-pay | Admitting: Cardiovascular Disease

## 2017-02-24 VITALS — BP 140/60 | HR 71 | Ht 71.0 in | Wt 205.5 lb

## 2017-02-24 DIAGNOSIS — I25118 Atherosclerotic heart disease of native coronary artery with other forms of angina pectoris: Secondary | ICD-10-CM

## 2017-02-24 DIAGNOSIS — Z7189 Other specified counseling: Secondary | ICD-10-CM | POA: Diagnosis not present

## 2017-02-24 DIAGNOSIS — I10 Essential (primary) hypertension: Secondary | ICD-10-CM

## 2017-02-24 DIAGNOSIS — Z952 Presence of prosthetic heart valve: Secondary | ICD-10-CM | POA: Diagnosis not present

## 2017-02-24 DIAGNOSIS — R0602 Shortness of breath: Secondary | ICD-10-CM | POA: Diagnosis not present

## 2017-02-24 MED ORDER — AMLODIPINE BESYLATE 10 MG PO TABS: 10.0000 mg | ORAL_TABLET | Freq: Every day | ORAL | 3 refills | Status: DC

## 2017-02-24 MED ORDER — ISOSORBIDE MONONITRATE ER 60 MG PO TB24: 60.0000 mg | ORAL_TABLET | Freq: Three times a day (TID) | ORAL | 3 refills | Status: DC

## 2017-02-24 MED ORDER — AMLODIPINE BESYLATE 10 MG PO TABS: 10.0000 mg | ORAL_TABLET | Freq: Every day | ORAL | 0 refills | Status: DC

## 2017-02-24 NOTE — Progress Notes (Signed)
Cardiology Office Note  Date:  02/24/2017   ID:  [name], DOB 1931-03-07, MRN [id number]  PCP:  [name], MD   Chief Complaint  Patient presents with  . other    Elevated BP and low HR c/o side effects from medications feeling weak. Meds reviewed verbally with pt.    HPI:  83 year old male with  h/o aortic stenosis  s/p St. Jude mechanical aortic valve replacement in 2002 on chronic Coumadin therapy,  mild CAD by cardiac cath in 2011 managed medically,  GERD,  HTN,  carotid bruits,  Chronic hyponatremia asymptomatic bradycardia,  RLS, anxiety, depression,  GI bleed osteoarthritis s/p total left hip replacement on 03/21/2015 who was discharged from [facility]/2 the above surgery with a hgb of 8 (baseline 12), he was started on po iron replacement therapy, who presented to on 04/02/15 with acute onset of weakness, melena, BRBPR, and was found to have a hgb of 5.1. Aspirin and warfarin initially held, restarted at a later date, S/p EGD He presents today for follow-up of his aortic valve replacement And hypertension  Recently seen in the emergency room Twice for hypertension On a prior office visit I recommended he increase isosorbide to 30 mg twice a day Also was recommended to double his Diovan dose He was given 1 dose clonidine in the emergency room On a follow-up visit with ryan Dunn it was recommended he increase isosorbide to 30 mg 3 times a day, increase metoprolol up to 50 mg daily He showed up and began in the emergency room several days later Again was given clonidine one dose and sent home He was given new prescription for clonidine He called our office, he does not like how he feels on clonidine  Every Tuesday he takes lasix 40 mg x1 Previously was taking Lasix 20 mg daily but this was held by primary care for low sodium. Notes indicating sodium did not change even after stopping the Lasix.  Echocardiogram performance in the past several weeks shows  normal LV function, well-functioning aortic valve, Moderately elevated right heart pressures with severe TR  Weight up to 205.8 pounds, High-end of his range  Not on hydralazine secondary to side effects Clonidine was fatigue dry mouth Prior notes indicating amlodipine 10 mg caused leg swelling, Though he reports stopping the amlodipine did not change his swelling  Other past medical history reviewed 48 hour Holter monitor 12/2015 Normal sinus rhythm Average heart beat 70 bpm, maximum heart rate 114 bpm, minimum 45 bpm (at 3 AM) Frequent APCs, couplets, bigeminy's with short runs, longest run of 5 beats (12% of all beats), Rare PVC   leg edema is better by holding amlodipine 10 mg Previous drop in Hemoglobin 9 improved up to 14  Previous Echocardiogram for Feb 22nd 2017 reviewed with him showing well-functioning aortic valve  Previous cardiac catheterization report from 2011 reviewed with him showing 20% proximal LAD, 40% mid LAD disease    PMH:   has a past medical history of Anxiety (10/11); Aortic stenosis; Bradycardia; C. difficile colitis; Carotid bruit; Chronic diastolic CHF (congestive heart failure) (HCC); Coronary artery disease; Decreased hearing; Depression; Gastric ulcer; GERD (gastroesophageal reflux disease); Hypertension; RLS (restless legs syndrome) (08/23/2015); S/P AVR; and SOB (shortness of breath) (10/11).  PSH:    Past Surgical History:  Procedure Laterality Date  . CARDIAC CATHETERIZATION    . ESOPHAGOGASTRODUODENOSCOPY N/A 04/05/2015   Procedure: ESOPHAGOGASTRODUODENOSCOPY (EGD);  Surgeon: Scot Jun, MD;  Location: [facility] ENDOSCOPY;  Service: Endoscopy;  Laterality: N/A;  . ESOPHAGOGASTRODUODENOSCOPY N/A 04/17/2015   Procedure: ESOPHAGOGASTRODUODENOSCOPY (EGD);  Surgeon: Scot Jun, MD;  Location: [facility] ENDOSCOPY;  Service: Endoscopy;  Laterality: N/A;  . ESOPHAGOGASTRODUODENOSCOPY N/A 08/02/2015   Procedure: ESOPHAGOGASTRODUODENOSCOPY (EGD);   Surgeon: Wallace Cullens, MD;  Location: [facility] ENDOSCOPY;  Service: Endoscopy;  Laterality: N/A;  . HERNIA REPAIR    . JOINT REPLACEMENT    . TOTAL HIP ARTHROPLASTY    . VALVE REPLACEMENT  1/02   Aortic; echo 3/09 valve working well; echo 10/11 working well; put on Coumadin    Current Outpatient Prescriptions  Medication Sig Dispense Refill  . acetaminophen (TYLENOL) 500 MG tablet Take 500 mg by mouth every 4 (four) hours as needed for mild pain or headache.     . ALPRAZolam (XANAX) 0.5 MG tablet Take 0.5 tablets (0.25 mg total) by mouth daily as needed for anxiety. (Patient taking differently: Take 0.5 mg by mouth daily as needed for anxiety. ) 10 tablet 0  . Cetirizine HCl 10 MG CAPS Take 10 mg by mouth daily.    . citalopram (CELEXA) 20 MG tablet Take 20 mg by mouth daily.    Marland Kitchen doxazosin (CARDURA) 8 MG tablet Take 1 tablet (8 mg total) by mouth 2 (two) times daily. 180 tablet 3  . finasteride (PROSCAR) 5 MG tablet Take 5 mg by mouth daily.      . furosemide (LASIX) 20 MG tablet, 2 pills once a week Take 1 tablet (20 mg total) by mouth daily as needed. 30 tablet 6  . isosorbide mononitrate (IMDUR) 30 MG 24 hr tablet Take 1 tablet (30 mg total) by mouth daily. 90 tablet 3  . meloxicam (MOBIC) 7.5 MG tablet Take 7.5 mg by mouth as needed.    . metoprolol succinate (TOPROL XL) 25 MG 24 hr tablet Take 1 tablet (25 mg total) by mouth every evening. 30 tablet 11  . Multiple Vitamin (MULTIVITAMIN WITH MINERALS) TABS tablet Take 1 tablet by mouth daily.    . pantoprazole (PROTONIX) 40 MG tablet Take 1 tablet (40 mg total) by mouth daily. 30 tablet 2  . rOPINIRole (REQUIP) 2 MG tablet Take 1 tablet by mouth 4  times daily 360 tablet 3  . Tamsulosin HCl (FLOMAX) 0.4 MG CAPS Take 0.4 mg by mouth at bedtime.     . traZODone (DESYREL) 50 MG tablet Take 50 mg by mouth at bedtime.    . valsartan (DIOVAN) 320 MG tablet Take 320 mg by mouth daily.      Marland Kitchen warfarin (COUMADIN) 5 MG tablet Take as directed by  [facility] 150 tablet 1   No current facility-administered medications for this visit.      Allergies:   Levofloxacin and Sulfa antibiotics   Social History:  The patient  reports that he has quit smoking. He has never used smokeless tobacco. He reports that he drinks about 1.8 oz of alcohol per week . He reports that he does not use drugs.   Family History:   family history includes Diabetes type II in his mother; Heart attack in his mother; Heart disease in his father and mother; Hypertension in his father and mother; Stroke in his brother, brother, and father.    Review of Systems: Review of Systems  Constitutional: Negative.   Respiratory: Negative.   Cardiovascular: Negative.   Gastrointestinal: Negative.   Musculoskeletal: Negative.   Neurological: Negative.   Psychiatric/Behavioral: Negative.   All other systems reviewed and are negative.  PHYSICAL EXAM: VS:  BP 140/60 (BP Location: Left Arm, Patient Position: Sitting, Cuff Size: Normal)   Pulse 71   Ht  (1.803 m)   Wt 205 lb 8 oz (93.2 kg)   BMI 28.66 kg/m  , BMI Body mass index is 28.66 kg/m. GEN: Well nourished, well developed, in no acute distress  HEENT: normal  Neck: no JVD, carotid bruits, or masses Cardiac: RRR; no murmurs, rubs, or gallops,Trace pitting edema Above the socks bilaterally Respiratory:  clear to auscultation bilaterally, normal work of breathing GI: soft, nontender, nondistended, + BS MS: no deformity or atrophy  Skin: warm and dry, no rash Neuro:  Strength and sensation are intact Psych: euthymic mood, full affect    Recent Labs: 02/18/2017: TSH 3.090 02/22/2017: ALT 15; BUN 20; Creatinine, Ser 0.84; Hemoglobin 12.3; Platelets 233; Potassium 4.5; Sodium 129    Lipid Panel No results found for: CHOL, HDL, LDLCALC, TRIG    Wt Readings from Last 3 Encounters:  12/09/16 200 lb (90.7 kg)  11/08/16 183 lb (83 kg) (wrong weight?)  10/15/16 202 lb 4 oz (91.7 kg)        ASSESSMENT AND PLAN:  Essential hypertension - Plan: EKG 12-Lead Long discussion concerning his hypertension Blood pressures are running very high in the morning, several trips to the emergency room x2 For systolic pressures more than 180 in the morning He has side effects on clonidine and metoprolol, fatigue We have recommended he take metoprolol 50 mg in the evening Recommended he stop clonidine Secondary to side effects He will take Lasix 20 mg daily for moderately elevated right heart pressures and shortness of breath. We have suggested Suggested he change isosorbide up to 60 mg twice a day Diovan in the morning Suggested he restart amlodipine 5 mg daily In the p.m.. He did have swelling previously on 10 mg Suggested he call our office with blood pressure numbers Blood pressure today 140 even on my recheck over 65  Coronary artery disease involving native coronary artery of native heart without angina pectoris - Plan: EKG 12-Lead Currently with no symptoms of angina. No further workup at this time. Continue current medication regimen.  S/P AVR (aortic valve replacement) Visualized on recent echocardiogram, well-seated  SOB (shortness of breath) Moderately elevated right heart pressures on recent echocardiogram We will restart Lasix 20 g daily with basic metabolic panel in 2-3 weeks time   Total encounter time more than 45 minutes  Greater than 50% was spent in counseling and coordination of care with the patient   Disposition:   F/U  3 months   No orders of the defined types were placed in this encounter.    Signed, Dossie Arbour, M.D., Ph.D. 02/24/2017  [facility] [place], [place] [phone]

## 2017-02-24 NOTE — Telephone Encounter (Signed)
Pt added on to see Dr. Mariah Milling today.

## 2017-02-24 NOTE — Addendum Note (Signed)
Addended by: Antonieta Iba on: 02/24/2017 01:08 PM   Modules accepted: Level of Service

## 2017-02-24 NOTE — Telephone Encounter (Signed)
Pt states he has to go to the ED this weekend, states his BP was 222/90 "something". He states they gave his Clonidine, and he doesn't react very well to this. He states he feels real weak, dry mouth, and itchy skin. States he does not feel safe to drive. States even walking, he doesn't feel safe, he is currently using a walker/cane. Please call.

## 2017-02-24 NOTE — Patient Instructions (Addendum)
Medication Instructions:   Please stop the clonidine  Start amlodipine 1/2 pill daily (5 mg) in the evening  Continue metoprolol 25 twice a day  Continue diovan in the morning Continue cardura twice a day Increase isosorbide up to 60 mg twice a day  Restart lasix 20 mg daily  For emergency, Take clonidine or extra isosorbide or amlodipine   Labwork:  No new labs needed  Testing/Procedures:  No further testing at this time   I recommend watching educational videos on topics of interest to you at:       [email or web address].goemmi.com  Enter code: HEARTCARE    Follow-Up: It was a pleasure seeing you in the office today. Please call us if you have new issues that need to be addressed before your next appt.  [phone]  Your physician wants you to follow-up in: 3 months.  You will receive a reminder letter in the mail two months in advance. If you don't receive a letter, please call our office to schedule the follow-up appointment.  If you need a refill on your cardiac medications before your next appointment, please call your pharmacy.

## 2017-03-01 ENCOUNTER — Other Ambulatory Visit: Payer: Self-pay | Admitting: Cardiovascular Disease

## 2017-03-03 ENCOUNTER — Telehealth: Payer: Self-pay | Admitting: Cardiovascular Disease

## 2017-03-03 NOTE — Telephone Encounter (Signed)
Patient wants to clarify isosorbide dosage .  Please call.  If no answer please leave a msg

## 2017-03-03 NOTE — Telephone Encounter (Signed)
Refill Request.  

## 2017-03-03 NOTE — Telephone Encounter (Signed)
No answer. Left message with instructions from OV on 02/24/17 with Dr Mariah Milling as follows: "Suggested he change isosorbide up to 60 mg twice a day"  Let patient know to call back if he has any further questions regarding medication.

## 2017-03-04 ENCOUNTER — Telehealth: Payer: Self-pay | Admitting: Cardiovascular Disease

## 2017-03-04 MED ORDER — AMLODIPINE BESYLATE 5 MG PO TABS: 5.0000 mg | ORAL_TABLET | Freq: Every day | ORAL | 3 refills | Status: DC

## 2017-03-04 NOTE — Telephone Encounter (Signed)
Left message on pt's vm w/ Dr. Windell Hummingbird recommendations.  Asked him to call back w/ any further questions or concerns.

## 2017-03-04 NOTE — Telephone Encounter (Signed)
Perhaps we could send in new prescription for amlodipine 5 We are trying to avoid amlodipine 10 as this can cause more leg swelling I would recommend he take half pill up to whole pill of clonidine as needed for blood pressure over 170 until we get blood pressures improved Clonidine can be taken 2 or 3 times per day

## 2017-03-04 NOTE — Telephone Encounter (Signed)
Pt c/o BP issue: STAT if pt c/o blurred vision, one-sided weakness or slurred speech  1. What are your last 5 BP readings?  Before medications 4/7-219/83 4/8-187/82 4/9-192/79 This morning 212/85  2. Are you having any other symptoms (ex. Dizziness, headache, blurred vision, passed out)? Dizziness, unsteady on feet  3. What is your BP issue? elevated

## 2017-03-04 NOTE — Telephone Encounter (Signed)
Spoke w/ pt.  He reports that his BP is still elevated: 212/85 this am. Reviewed pt's meds and previous instructions w/ him.   He was advised at last ov: "Please stop the clonidine Start amlodipine 1/2 pill daily (5 mg) in the evening Continue metoprolol 25 twice a day Continue diovan in the morning  Continue cardura twice a day  Increase isosorbide up to 60 mg twice a day Restart lasix 20 mg daily  For emergency, Take clonidine or extra isosorbide or amlodipine"  He has been following these instructions w/ the exception of: He takes metoprolol 50 mg in the evening instead of BID. He has not taken any extra pills, as he was unsure of which one to take. The amlodipine pills are so small that he cannot cut them in 1/2 w/o them crumbling - he would like to take a while pill in the evening. Advised him that I will make Dr. Mariah Milling aware of his concerns and call him back w/ his recommendation.

## 2017-03-12 ENCOUNTER — Ambulatory Visit (INDEPENDENT_AMBULATORY_CARE_PROVIDER_SITE_OTHER): Payer: Medicare Other

## 2017-03-12 DIAGNOSIS — I359 Nonrheumatic aortic valve disorder, unspecified: Secondary | ICD-10-CM | POA: Diagnosis not present

## 2017-03-12 DIAGNOSIS — Z7901 Long term (current) use of anticoagulants: Secondary | ICD-10-CM | POA: Diagnosis not present

## 2017-03-12 DIAGNOSIS — I350 Nonrheumatic aortic (valve) stenosis: Secondary | ICD-10-CM | POA: Diagnosis not present

## 2017-03-12 DIAGNOSIS — Z952 Presence of prosthetic heart valve: Secondary | ICD-10-CM

## 2017-03-12 LAB — POCT INR: INR: 2.7

## 2017-03-20 ENCOUNTER — Other Ambulatory Visit (INDEPENDENT_AMBULATORY_CARE_PROVIDER_SITE_OTHER): Payer: Medicare Other | Admitting: *Deleted

## 2017-03-20 ENCOUNTER — Ambulatory Visit: Payer: Medicare Other | Admitting: Cardiovascular Disease

## 2017-03-20 DIAGNOSIS — Z952 Presence of prosthetic heart valve: Secondary | ICD-10-CM

## 2017-03-20 DIAGNOSIS — I10 Essential (primary) hypertension: Secondary | ICD-10-CM | POA: Diagnosis not present

## 2017-03-20 DIAGNOSIS — I25118 Atherosclerotic heart disease of native coronary artery with other forms of angina pectoris: Secondary | ICD-10-CM

## 2017-03-20 DIAGNOSIS — Z7189 Other specified counseling: Secondary | ICD-10-CM

## 2017-03-20 DIAGNOSIS — R0602 Shortness of breath: Secondary | ICD-10-CM

## 2017-03-21 LAB — BASIC METABOLIC PANEL
BUN / CREAT RATIO: 20 (ref 10–24)
BUN: 21 mg/dL (ref 8–27)
CO2: 24 mmol/L (ref 18–29)
CREATININE: 1.06 mg/dL (ref 0.76–1.27)
Calcium: 9 mg/dL (ref 8.6–10.2)
Chloride: 93 mmol/L — ABNORMAL LOW (ref 96–106)
GFR calc Af Amer: 75 mL/min/{1.73_m2} (ref >59)
GFR calc non Af Amer: 65 mL/min/{1.73_m2} (ref >59)
Glucose: 99 mg/dL (ref 65–99)
POTASSIUM: 5 mmol/L (ref 3.5–5.2)
SODIUM: 132 mmol/L — AB (ref 134–144)

## 2017-04-04 ENCOUNTER — Telehealth: Payer: Self-pay | Admitting: Cardiovascular Disease

## 2017-04-04 NOTE — Telephone Encounter (Addendum)
Spoke w/ pt.  He reports that since yesterday, he has been feeling dizzy and having trouble focusing his eyes. Reports BP yesterday while symptomatic 162/74, HR 51. SBPs have been running consistently in the 170s. His HR typically runs around 60 w/ no issues. He is still dizzy today, but reports he is in NAD, using a walker while ambulating. Advised him that Dr. Mariah MillingGollan is at the hospital this am, but I will send him a message. Asked him to call his PCP in the meantime to see if she has any recommendations.  He is appreciative, will call his PCP and also wait for call back w/ Dr. Windell HummingbirdGollan's thoughts.

## 2017-04-04 NOTE — Telephone Encounter (Signed)
Pt calling stating since yesterday he's been having some dizziness and can't focus with his eyes Not sure what could be causing this  Would like some advise on this  Please call back

## 2017-04-06 NOTE — Telephone Encounter (Signed)
It would appear he saw primary care last Friday If symptoms do not improve, would recommend he call primary care back He may need CT scan or MRI to rule out TIA or stroke

## 2017-04-23 ENCOUNTER — Ambulatory Visit (INDEPENDENT_AMBULATORY_CARE_PROVIDER_SITE_OTHER): Payer: Medicare Other

## 2017-04-23 DIAGNOSIS — Z7901 Long term (current) use of anticoagulants: Secondary | ICD-10-CM | POA: Diagnosis not present

## 2017-04-23 DIAGNOSIS — I359 Nonrheumatic aortic valve disorder, unspecified: Secondary | ICD-10-CM | POA: Diagnosis not present

## 2017-04-23 DIAGNOSIS — I350 Nonrheumatic aortic (valve) stenosis: Secondary | ICD-10-CM

## 2017-04-23 DIAGNOSIS — Z952 Presence of prosthetic heart valve: Secondary | ICD-10-CM

## 2017-04-23 LAB — POCT INR: INR: 3.2

## 2017-05-06 ENCOUNTER — Telehealth: Payer: Self-pay | Admitting: Cardiovascular Disease

## 2017-05-06 NOTE — Telephone Encounter (Signed)
Spoke with patient and he wanted to confirm dosage of isosorbide mononitrate. OV note from 02/24/17 instructs him to take isosorbide mononitrate 60 mg twice a day and extra dose for emergencies. Reviewed this information with patient and he verbalized understanding with no further questions at this time.

## 2017-05-06 NOTE — Telephone Encounter (Signed)
Pt calling asking about the dose he is to take on his Isosorbide   Would like a call back

## 2017-05-21 ENCOUNTER — Ambulatory Visit (INDEPENDENT_AMBULATORY_CARE_PROVIDER_SITE_OTHER): Payer: Medicare Other

## 2017-05-21 DIAGNOSIS — Z7901 Long term (current) use of anticoagulants: Secondary | ICD-10-CM

## 2017-05-21 DIAGNOSIS — I359 Nonrheumatic aortic valve disorder, unspecified: Secondary | ICD-10-CM | POA: Diagnosis not present

## 2017-05-21 DIAGNOSIS — I350 Nonrheumatic aortic (valve) stenosis: Secondary | ICD-10-CM | POA: Diagnosis not present

## 2017-05-21 DIAGNOSIS — Z952 Presence of prosthetic heart valve: Secondary | ICD-10-CM | POA: Diagnosis not present

## 2017-05-21 LAB — POCT INR: INR: 2.5

## 2017-06-06 NOTE — Progress Notes (Signed)
Cardiology Office Note  Date:  06/10/2017   ID:  [name], DOB 08/18/1931, MRN [id number]  PCP:  [name], MD   Chief Complaint  Patient presents with  . other    RM 2  65mo f/u. Pt c/o sob at times and swelling/redness in bilateral lower legs; denies cp. Pt having dental surgery Thursday and having coumadin dose checked tomorrow. Reviewed meds with pt verbally.    HPI:  83 year old male with  h/o aortic stenosis  s/p [employer] mechanical aortic valve replacement in 2002 on chronic Coumadin therapy,  mild CAD by cardiac cath in 2011 managed medically,  GERD,  HTN,  carotid bruits,  Chronic hyponatremia asymptomatic bradycardia,  RLS, anxiety, depression,  GI bleed osteoarthritis s/p total left hip replacement on 03/21/2015 who was discharged from [facility] the above surgery with a hgb of 8 (baseline 12), he was started on po iron replacement therapy, who presented to on 04/02/15 with acute onset of weakness, melena, BRBPR, and was found to have a hgb of 5.1. Aspirin and warfarin initially held, restarted at a later date, S/p EGD He presents today for follow-up of his aortic valve replacement And hypertension  In follow-up today he reports that he is scheduled to have 3 teeth pulled on Thursday Needs to have INR less than 2.5  Reports his blood pressure is well controlled on current regimen Uncertain if he is taking isosorbide 30 or 60 twice a day Feels he is taking amlodipine 5 but now with worsening leg swelling. Previously had leg swelling on 10 mg  EKG personally reviewed by myself on todays visit Shows sinus bradycardia rate 58 bpm no significant ST or T-wave changes  Other past medical history reviewed Recently seen in the emergency room Twice for hypertension On a prior office visit I recommended he increase isosorbide to 30 mg twice a day Also was recommended to double his Diovan dose He was given 1 dose clonidine in the emergency room On a follow-up  visit with ryan Dunn it was recommended he increase isosorbide to 30 mg 3 times a day, increase metoprolol up to 50 mg daily He showed up and began in the emergency room several days later  Previous Echocardiogram shows normal LV function, well-functioning aortic valve, Moderately elevated right heart pressures with severe TR  Not on hydralazine secondary to side effects Clonidine was fatigue dry mouth Prior notes indicating amlodipine 10 mg caused leg swelling,  Though he reports stopping the amlodipine did not change his swelling  Other past medical history reviewed 48 hour Holter monitor 12/2015 Normal sinus rhythm Average heart beat 70 bpm, maximum heart rate 114 bpm, minimum 45 bpm (at 3 AM) Frequent APCs, couplets, bigeminy's with short runs, longest run of 5 beats (12% of all beats),    leg edema is better by holding amlodipine 10 mg Previous drop in Hemoglobin 9 improved up to 14  Previous Echocardiogram for Feb 22nd 2017 reviewed with him showing well-functioning aortic valve  Previous cardiac catheterization report from 2011 reviewed with him showing 20% proximal LAD, 40% mid LAD disease  PMH:   has a past medical history of Anxiety (10/11); Aortic stenosis; Bradycardia; C. difficile colitis; Carotid bruit; Chronic diastolic CHF (congestive heart failure) (HCC); Coronary artery disease; Decreased hearing; Depression; Gastric ulcer; GERD (gastroesophageal reflux disease); Hypertension; RLS (restless legs syndrome) (08/23/2015); S/P AVR; and SOB (shortness of breath) (10/11).  PSH:    Past Surgical History:  Procedure Laterality Date  .  CARDIAC CATHETERIZATION    . ESOPHAGOGASTRODUODENOSCOPY N/A 04/05/2015   Procedure: ESOPHAGOGASTRODUODENOSCOPY (EGD);  Surgeon: Scot Jun, MD;  Location[facility] ENDOSCOPY;  Service: Endoscopy;  Laterality: N/A;  . ESOPHAGOGASTRODUODENOSCOPY N/A 04/17/2015   Procedure: ESOPHAGOGASTRODUODENOSCOPY (EGD);  Surgeon: Scot Jun, MD;   Location: [facility] ENDOSCOPY;  Service: Endoscopy;  Laterality: N/A;  . ESOPHAGOGASTRODUODENOSCOPY N/A 08/02/2015   Procedure: ESOPHAGOGASTRODUODENOSCOPY (EGD);  Surgeon: Wallace Cullens, MD;  Location: [facility] ENDOSCOPY;  Service: Endoscopy;  Laterality: N/A;  . HERNIA REPAIR    . JOINT REPLACEMENT    . TOTAL HIP ARTHROPLASTY    . VALVE REPLACEMENT  1/02   Aortic; echo 3/09 valve working well; echo 10/11 working well; put on Coumadin    Current Outpatient Prescriptions on File Prior to Visit  Medication Sig Dispense Refill  . acetaminophen (TYLENOL) 500 MG tablet Take 500 mg by mouth every 4 (four) hours as needed for mild pain or headache.     . ALPRAZolam (XANAX) 0.5 MG tablet Take 0.5 tablets (0.25 mg total) by mouth daily as needed for anxiety. (Patient taking differently: Take 0.5 mg by mouth daily as needed for anxiety. ) 10 tablet 0  . amLODipine (NORVASC) 5 MG tablet Take 1 tablet (5 mg total) by mouth daily. 180 tablet 3  . Cetirizine HCl 10 MG CAPS Take 10 mg by mouth daily.    . citalopram (CELEXA) 20 MG tablet Take 20 mg by mouth daily.    . cloNIDine (CATAPRES) 0.1 MG tablet Take 1 tablet (0.1 mg total) by mouth 2 (two) times daily as needed. 60 tablet 5  . doxazosin (CARDURA) 8 MG tablet Take 1 tablet (8 mg total) by mouth 2 (two) times daily. 180 tablet 3  . finasteride (PROSCAR) 5 MG tablet Take 5 mg by mouth daily.      . furosemide (LASIX) 20 MG tablet Take 1 tablet (20 mg total) by mouth daily as needed. 90 tablet 3  . isosorbide mononitrate (IMDUR) 60 MG 24 hr tablet Take 1 tablet (60 mg total) by mouth 3 (three) times daily. (Patient taking differently: Take 60 mg by mouth 2 (two) times daily. ) 180 tablet 3  . meloxicam (MOBIC) 7.5 MG tablet Take 7.5 mg by mouth as needed.    . Multiple Vitamin (MULTIVITAMIN WITH MINERALS) TABS tablet Take 1 tablet by mouth daily.    . pantoprazole (PROTONIX) 40 MG tablet Take 1 tablet (40 mg total) by mouth daily. (Patient taking differently: Take 40  mg by mouth daily as needed. ) 30 tablet 2  . rOPINIRole (REQUIP) 2 MG tablet Take 1 tablet by mouth 4  times daily 360 tablet 3  . Tamsulosin HCl (FLOMAX) 0.4 MG CAPS Take 0.4 mg by mouth at bedtime.     . traZODone (DESYREL) 50 MG tablet Take 50 mg by mouth at bedtime.    . valsartan (DIOVAN) 320 MG tablet Take 320 mg by mouth daily.      [name] warfarin (COUMADIN) 5 MG tablet TAKE AS DIRECTED BY  COUMADIN CLINIC (Patient taking differently: TAKE AS DIRECTED BY  COUMADIN CLINIC.  Currently taking 5mg  Mon and Friday and 7.5mg  Tues, Wed, Thurs, Sat, Sun.) 150 tablet 0   No current facility-administered medications on file prior to visit.     Allergies:   Levofloxacin and Sulfa antibiotics   Social History:  The patient  reports that he has quit smoking. He has never used smokeless tobacco. He reports that he drinks about 1.8 oz of  alcohol per week . He reports that he does not use drugs.   Family History:   family history includes Diabetes type II in his mother; Heart attack in his mother; Heart disease in his father and mother; Hypertension in his father, mother, and unknown relative; Stroke in his brother, brother, and father.    Review of Systems: Review of Systems  Constitutional: Negative.   Respiratory: Negative.   Cardiovascular: Negative.   Gastrointestinal: Negative.   Musculoskeletal: Negative.   Neurological: Negative.   Psychiatric/Behavioral: Negative.   All other systems reviewed and are negative.    PHYSICAL EXAM: VS:  BP 118/64 (BP Location: Left Arm, Patient Position: Sitting, Cuff Size: Normal)   Pulse (!) 58   Ht 5\' 11"  (1.803 m)   Wt 204 lb 8 oz (92.8 kg)   BMI 28.52 kg/m  , BMI Body mass index is 28.52 kg/m.  GEN: Well nourished, well developed, in no acute distress  HEENT: normal  Neck: no JVD, carotid bruits, or masses Cardiac: RRR; no murmurs, rubs, or gallops,Trace pitting edema  Respiratory:  clear to auscultation bilaterally, normal work of  breathing GI: soft, nontender, nondistended, + BS MS: no deformity or atrophy  Skin: warm and dry, no rash Neuro:  Strength and sensation are intact Psych: euthymic mood, full affect    Recent Labs: 02/18/2017: TSH 3.090 02/22/2017: ALT 15; Hemoglobin 12.3; Platelets 233 03/20/2017: BUN 21; Creatinine, Ser 1.06; Potassium 5.0; Sodium 132    Lipid Panel No results found for: CHOL, HDL, LDLCALC, TRIG    Wt Readings from Last 3 Encounters:  12/09/16 200 lb (90.7 kg)  11/08/16 183 lb (83 kg) (wrong weight?)  10/15/16 202 lb 4 oz (91.7 kg)       ASSESSMENT AND PLAN:  Essential hypertension - Plan: EKG 12-Lead Recommended he stop amlodipine given his leg swelling He will confirm dose of isosorbide, I suspect this is 60 mg twice a day Otherwise no other changes  Coronary artery disease involving native coronary artery of native heart without angina pectoris - Plan: EKG 12-Lead Currently with no symptoms of angina. No further workup at this time. Continue current medication regimen.  S/P AVR (aortic valve replacement) Visualized on recent echocardiogram, well-seated Tolerating anticoagulation We'll hold for 2 days in preparation for 3 teeth being pulled on Thursday, in 2 days  SOB (shortness of breath) Taking Lasix daily for elevated right heart pressures   Total encounter time more than 25 minutes  Greater than 50% was spent in counseling and coordination of care with the patient   Disposition:   F/U  6 months   Orders Placed This Encounter  Procedures  . EKG 12-Lead     Signed, Dossie Arbourim Samnang Shugars, M.D., Ph.D. 06/10/2017  [facility], [place] [phone]

## 2017-06-10 ENCOUNTER — Ambulatory Visit (INDEPENDENT_AMBULATORY_CARE_PROVIDER_SITE_OTHER): Payer: Medicare Other

## 2017-06-10 ENCOUNTER — Telehealth: Payer: Self-pay | Admitting: Cardiovascular Disease

## 2017-06-10 ENCOUNTER — Encounter: Payer: Self-pay | Admitting: Cardiovascular Disease

## 2017-06-10 ENCOUNTER — Ambulatory Visit (INDEPENDENT_AMBULATORY_CARE_PROVIDER_SITE_OTHER): Payer: Medicare Other | Admitting: Cardiovascular Disease

## 2017-06-10 VITALS — BP 118/64 | HR 58 | Ht 71.0 in | Wt 204.5 lb

## 2017-06-10 DIAGNOSIS — R0602 Shortness of breath: Secondary | ICD-10-CM | POA: Diagnosis not present

## 2017-06-10 DIAGNOSIS — I350 Nonrheumatic aortic (valve) stenosis: Secondary | ICD-10-CM

## 2017-06-10 DIAGNOSIS — R600 Localized edema: Secondary | ICD-10-CM | POA: Diagnosis not present

## 2017-06-10 DIAGNOSIS — I25118 Atherosclerotic heart disease of native coronary artery with other forms of angina pectoris: Secondary | ICD-10-CM

## 2017-06-10 DIAGNOSIS — Z7901 Long term (current) use of anticoagulants: Secondary | ICD-10-CM | POA: Diagnosis not present

## 2017-06-10 DIAGNOSIS — I10 Essential (primary) hypertension: Secondary | ICD-10-CM

## 2017-06-10 DIAGNOSIS — Z952 Presence of prosthetic heart valve: Secondary | ICD-10-CM

## 2017-06-10 LAB — POCT INR: INR: 2.8

## 2017-06-10 NOTE — Telephone Encounter (Signed)
Pt calling to let us know the dose of Isosorbide  He is currently taking 60 mg twice a day He is also asking if we may send his coumadin results from today to   Dr Graylon GunningJames Belcher  Fax [phone]

## 2017-06-10 NOTE — Patient Instructions (Addendum)
Medication Instructions:   Stop the amlodipine, causes leg swelling  Please check the dose of isosorbide ? 60 mg twice a day   Labwork:  No new labs needed  Testing/Procedures:  No further testing at this time   Follow-Up: It was a pleasure seeing you in the office today. Please call us if you have new issues that need to be addressed before your next appt.  [phone]  Your physician wants you to follow-up in: 6 months.  You will receive a reminder letter in the mail two months in advance. If you don't receive a letter, please call our office to schedule the follow-up appointment.  If you need a refill on your cardiac medications before your next appointment, please call your pharmacy.

## 2017-06-10 NOTE — Telephone Encounter (Signed)
Would continue isosorbide 60 twice a day Stop amlodipine as we discussed

## 2017-06-10 NOTE — Telephone Encounter (Signed)
You asked him to call back w/ isosorbide dosing.

## 2017-06-10 NOTE — Telephone Encounter (Signed)
Left message on pt's vm w/ Dr. Windell HummingbirdGollan's recommendation.  Routed coumadin visit to Dr. Mendel CorningBelcher's office.

## 2017-06-11 ENCOUNTER — Telehealth: Payer: Self-pay | Admitting: Cardiovascular Disease

## 2017-06-11 NOTE — Telephone Encounter (Signed)
Pt states his INR check from yesterday is "fine with his dentist". He asks if he needs to come in tomorrow to get it rechecked.

## 2017-06-11 NOTE — Telephone Encounter (Signed)
Spoke w/ pt.  Advised him that if dentist is agreeable to extracting teeth w/ INR of 2.8, for him to resume coumadin and not hold tonight.  Cancelled tomorrow's coumadin appt and resched for 4 weeks.  He is appreciative and will call back w/ any further questions or concerns.

## 2017-06-16 ENCOUNTER — Other Ambulatory Visit: Payer: Self-pay | Admitting: Cardiovascular Disease

## 2017-06-23 ENCOUNTER — Other Ambulatory Visit: Payer: Self-pay | Admitting: Cardiovascular Disease

## 2017-06-23 NOTE — Telephone Encounter (Signed)
Please review for refill, thanks ! 

## 2017-06-23 NOTE — Telephone Encounter (Signed)
Please review for refill, Thanks !  

## 2017-06-30 ENCOUNTER — Other Ambulatory Visit: Payer: Self-pay | Admitting: *Deleted

## 2017-06-30 MED ORDER — ISOSORBIDE MONONITRATE ER 60 MG PO TB24: 60.0000 mg | ORAL_TABLET | Freq: Three times a day (TID) | ORAL | 3 refills | Status: DC

## 2017-07-07 ENCOUNTER — Other Ambulatory Visit: Payer: Self-pay | Admitting: Cardiovascular Disease

## 2017-07-09 ENCOUNTER — Ambulatory Visit (INDEPENDENT_AMBULATORY_CARE_PROVIDER_SITE_OTHER): Payer: Medicare Other

## 2017-07-09 DIAGNOSIS — Z7901 Long term (current) use of anticoagulants: Secondary | ICD-10-CM

## 2017-07-09 DIAGNOSIS — I350 Nonrheumatic aortic (valve) stenosis: Secondary | ICD-10-CM

## 2017-07-09 DIAGNOSIS — Z952 Presence of prosthetic heart valve: Secondary | ICD-10-CM | POA: Diagnosis not present

## 2017-07-09 LAB — POCT INR: INR: 2.5

## 2017-07-17 ENCOUNTER — Other Ambulatory Visit: Payer: Self-pay | Admitting: Adult Health

## 2017-08-01 ENCOUNTER — Telehealth: Payer: Self-pay | Admitting: Cardiovascular Disease

## 2017-08-01 NOTE — Telephone Encounter (Signed)
Reviewed BP issue with Dr. Mariah MillingGollan who recommends pt take scheduled clonidine 0.1mg  BID throughout the weekend. He will monitor BP and HR before taking medications and 1 hour after. He understands if elevated BP issues resolve, he can then take clonidine PRN. Pt will call Monday with weekend readings.

## 2017-08-01 NOTE — Telephone Encounter (Signed)
S/w pt who reports SBP 200 this morning. He took medications at 8:30; SBP 170s at 9:30am Yesterday SBP 200 as well and he took a PRN clonidine; recheck SBP 160s,  Reviewed medications.  Reports he has been taking imdur 60mg  BID; prescription is TID.  Valsartan 320mg  changed to olmesartan 40mg  by Dr. Dan HumphreysWalker, PCP, in July due to valsartan recall. Pt reports his head feels "tight" with periods of feeling dizzy and weak. No other sx.  Recheck pressure while on the phone: BP 188/86. HR 58 Advised pt to take one PRN clonidine now and recheck BP in one hour.  Pt reports he is not under any stress nor has had any dietary changed/increased sodium intake.  States he knows not to drink alcohol with clonidine but he has one glass of wine on Friday evenings.  He will refrain from alcohol until BP is controlled. Will call patient in one hour to see if BP improved. Route to Dr. Mariah MillingGollan to make aware and for further advise.

## 2017-08-01 NOTE — Telephone Encounter (Signed)
Pt calling his BP  HR 58 155/65 152/61

## 2017-08-01 NOTE — Telephone Encounter (Signed)
S/w pt to check on his BP. He is not home at this time but will call back with readings later on today.

## 2017-08-01 NOTE — Telephone Encounter (Signed)
BP improved to 155/65 and 152/61 with PRN clonidine. Will route to Dr. Mariah MillingGollan for further recommendation.

## 2017-08-01 NOTE — Telephone Encounter (Signed)
Pt calling stating staring yesterday before he takes his BP medication  It around 200  He is not sure what to do or if he needs to take an extra of the medication  After he takes the medication it goes down  180/88 (esitmated)  He did not record the numbers   Would like some advise

## 2017-08-02 NOTE — Telephone Encounter (Signed)
As discussed on Friday, would take clonidine 0.1 BID and monitor pressures, Can we call into next week and follow up on pressures? thx TG

## 2017-08-04 NOTE — Telephone Encounter (Signed)
Would increase clonidine up to 0.1 mg 3 times a day 8 AM, 3 PM, before bed Continue to monitor pressures We may end up needing 0.2 mg twice a day

## 2017-08-04 NOTE — Telephone Encounter (Signed)
S/w patient about updated BP/HR readings from the weekend.  08/02/17:  Before meds 221/91 HR 49 After meds 162/66 HR 52  08/03/17  Before meds 213/90 HR 50 After meds 179/74 HR 52  4 pm 198/82 on Saturday afternoon and took his second dose of Clonidine at that time. 6 pm 136/69, HR 52  08/04/17 Before meds 5 am 195/86 HR 51 After meds 0910 168/78,  HR 52  Patient reports a "tightness" in his head when BP is elevated.  Denies headache, blurred vision or chest pain. Shortness of breath at times which he said is normal for him. Patient did not take any extra doses of clonidine over the weekend; took it BID only. Advised patient and will route to Dr Mariah MillingGollan for review and further advice.

## 2017-08-04 NOTE — Telephone Encounter (Signed)
Pt calling in his BP readings  08/02/17:  Before meds 221/91 HR 49 After meds 162/66 HR 52 08/03/17  Before meds 213/90 HR 50 After meds 179/74 HR 52 08/04/17 5 am 195/86 HR 51 Has not taken it since then

## 2017-08-04 NOTE — Telephone Encounter (Signed)
Called patient and he verbalized understanding of recommendations. He will continue to monitor BP and HR and let us know.

## 2017-08-05 NOTE — Telephone Encounter (Signed)
Spoke with patient and reviewed Dr. Windell HummingbirdGollan's recommendations and he verbalized understanding. Let him know that we would call him back next week to check and see what how his blood pressures are looking and for him to call back if he has any further concerns. He verbalized understanding with no further questions at this time.

## 2017-08-06 ENCOUNTER — Telehealth: Payer: Self-pay | Admitting: Cardiovascular Disease

## 2017-08-06 NOTE — Telephone Encounter (Signed)
Pt states he is taking an extra Conidine. States occasionally his BP drops really low. Just now it was 128/64. States he feels dizzy. His BP yesterday was 165/71 took his meds, and in the afternoon it was 113/55. Please call.

## 2017-08-06 NOTE — Telephone Encounter (Signed)
Would avoid amlodipine given leg swelling He can try and cut clonidine in 1/2 BID Take extra 1/2 clonidine for high pressure

## 2017-08-06 NOTE — Telephone Encounter (Signed)
Spoke with patient and he reports that his blood pressure this morning was 172/78 before medications and then at 08:00 AM it was 129/64 and he reports this is just too low for him. Reviewed with him that we like for blood pressures to be 140/80 or lower and that this was a good number. He feels that since taking the clonidine his blood pressures have been too low. He requested that I ask Dr. Mariah MillingGollan if he can switch back to amlodipine instead of the clonidine. Reviewed with him that amlodipine was discontinued back 06/10/17 due to leg swelling and he reports that he does not feel that has not changed. Advised him that I would route message to Dr. Mariah MillingGollan for review. Instructed him to continue monitoring his blood pressures and he verbalized understanding with no further questions.

## 2017-08-07 NOTE — Telephone Encounter (Signed)
Reviewed Dr. Windell HummingbirdGollan's recommendations with patient. He states that he just feels tired when it gets below 140/80. Instructed him to continue monitoring his blood pressures and to try decreasing the clonidine to 1/2 tablet twice a day with extra 1/2 tablet if needed for high pressures. He verbalized understanding of our conversation, agreement with plan, and had no further questions at this time.

## 2017-08-07 NOTE — Telephone Encounter (Signed)
Left voicemail message to call back  

## 2017-08-11 NOTE — Telephone Encounter (Signed)
Patient says he continues bp and with BLE Swelling  Pt c/o swelling: STAT is pt has developed SOB within 24 hours  1. How long have you been experiencing swelling? 2 weeks   2. Where is the swelling located? BLE atleast to knee feels swelling over body gained 13 lbs in 2 weeks   3.  Are you currently taking a "fluid pill"? Yes   4.  Are you currently SOB? YES   5.  Have you traveled recently? No

## 2017-08-11 NOTE — Telephone Encounter (Signed)
Spoke with patient and he reports that his blood pressures are much better especially after taking his medications. He states that he has increased swelling though to his lower extremities. Weight is up by 13 pounds in 2 weeks. Current weight with no clothing is 212-213 lbs. Instructed him to take additional dose of furosemide today and then take 2 tablets tomorrow as well to see if that will help. Instructed him to wear compression hose/socks and elevate his feet when sitting. Reviewed diet restrictions of not eating lots of salty foods and he denied eating anything out of the ordinary. Instructed him to continue monitoring his weights and that I would route this to Dr. Mariah Milling for further review. He was appreciative for the call and had no further questions at this time.

## 2017-08-12 DIAGNOSIS — E871 Hypo-osmolality and hyponatremia: Secondary | ICD-10-CM | POA: Insufficient documentation

## 2017-08-12 NOTE — Progress Notes (Signed)
Cardiology Office Note  Date:  08/13/2017   ID:  Korbin Mapps, DOB 08-26-1931, MRN [id number]  PCP:  [name], MD   Chief Complaint  Patient presents with  . other    Patient c/o swelling and weight pain. Meds reviewed verbally with patient.     HPI:  83 year old male with  h/o aortic stenosis  s/p [employer] mechanical aortic valve replacement in 2002 on chronic Coumadin therapy,  mild CAD by cardiac cath in 2011 managed medically,  GERD,  HTN,  carotid bruits,  Chronic hyponatremia asymptomatic bradycardia,  RLS, anxiety, depression,  GI bleed osteoarthritis s/p total left hip replacement on 03/21/2015 who was discharged from [facility] the above surgery with a hgb of 8 (baseline 12), he was started on po iron replacement therapy, who presented to on 04/02/15 with acute onset of weakness, melena, BRBPR, and was found to have a hgb of 5.1. Aspirin and warfarin initially held, restarted at a later date, S/p EGD He presents today for follow-up of his aortic valve replacement And hypertension  In follow-up today he reports more than 10 pound weight gain over the past several weeks Weight is up 37 pounds in the past year Significant leg swelling, pitting edema Denies any change in his diet or fluid intake Denies eating out frequently, denies high salt intake No long trips, car rides  He does not wear compression hose He does have some abdominal swelling, bloating which is new for him  Sometimes takes Lasix 20 mg For the past 2 days has taken Lasix 40 daily Urinated well a first day, not as much the second day  He is taking clonidine 0.1 mg 3 times a day Denies taking any amlodipine He's taking Benicar 40 mg daily Taking isosorbide milligrams twice a day Notes indicate taking Cardura 8 mg twice a day  EKG personally reviewed by myself on todays visit Shows sinus bradycardia rate 53 bpm no significant ST or T-wave changes  Other past medical history  reviewed Recently seen in the emergency room Twice for hypertension On a prior office visit I recommended he increase isosorbide to 30 mg twice a day Also was recommended to double his Diovan dose He was given 1 dose clonidine in the emergency room On a follow-up visit with ryan Dunn it was recommended he increase isosorbide to 30 mg 3 times a day, increase metoprolol up to 50 mg daily He showed up and began in the emergency room several days later  Previous Echocardiogram shows normal LV function, well-functioning aortic valve, Moderately elevated right heart pressures with severe TR  Not on hydralazine secondary to side effects Clonidine was fatigue dry mouth Prior notes indicating amlodipine 10 mg caused leg swelling,  Though he reports stopping the amlodipine did not change his swelling  Other past medical history reviewed 48 hour Holter monitor 12/2015 Normal sinus rhythm Average heart beat 70 bpm, maximum heart rate 114 bpm, minimum 45 bpm (at 3 AM) Frequent APCs, couplets, bigeminy's with short runs, longest run of 5 beats (12% of all beats),    leg edema is better by holding amlodipine 10 mg Previous drop in Hemoglobin 9 improved up to 14  Previous Echocardiogram for Feb 22nd 2017 reviewed with him showing well-functioning aortic valve  Previous cardiac catheterization report from 2011 reviewed with him showing 20% proximal LAD, 40% mid LAD disease  PMH:   has a past medical history of Anxiety (10/11); Aortic stenosis; Bradycardia; C. difficile colitis; Carotid  bruit; Chronic diastolic CHF (congestive heart failure) (HCC); Coronary artery disease; Decreased hearing; Depression; Gastric ulcer; GERD (gastroesophageal reflux disease); Hypertension; RLS (restless legs syndrome) (08/23/2015); S/P AVR; and SOB (shortness of breath) (10/11).  PSH:    Past Surgical History:  Procedure Laterality Date  . CARDIAC CATHETERIZATION    . ESOPHAGOGASTRODUODENOSCOPY N/A 04/05/2015    Procedure: ESOPHAGOGASTRODUODENOSCOPY (EGD);  Surgeon: Scot Jun, MD;  Location: [facility] END[facility]COPY;  Service: Endoscopy;  Laterality: N/A;  . ESOPHAGOGASTRODUODENOSCOPY N/A 04/17/2015   Procedure: ESOPHAGOGASTRODUODENOSCOPY (EGD);  Surgeon: Scot Jun, MD;  Location: [facility] ENDOSCOPY;  Service: Endoscopy;  Laterality: N/A;  . ESOPHAGOGASTRODUODENOSCOPY N/A 08/02/2015   Procedure: ESOPHAGOGASTRODUODENOSCOPY (EGD);  Surgeon: Wallace Cullens, MD;  Location: [facility] ENDOSCOPY;  Service: Endoscopy;  Laterality: N/A;  . HERNIA REPAIR    . JOINT REPLACEMENT    . TOTAL HIP ARTHROPLASTY    . VALVE REPLACEMENT  1/02   Aortic; echo 3/09 valve working well; echo 10/11 working well; put on Coumadin    Current Outpatient Prescriptions on File Prior to Visit  Medication Sig Dispense Refill  . acetaminophen (TYLENOL) 500 MG tablet Take 500 mg by mouth every 4 (four) hours as needed for mild pain or headache.     . ALPRAZolam (XANAX) 0.5 MG tablet Take 0.5 tablets (0.25 mg total) by mouth daily as needed for anxiety. (Patient taking differently: Take 0.5 mg by mouth daily as needed for anxiety. ) 10 tablet 0  . amoxicillin (AMOXIL) 500 MG capsule Take 500 mg by mouth as needed. Dental procedures  1  . celecoxib (CELEBREX) 100 MG capsule Take 100 mg by mouth as needed.    . Cetirizine HCl 10 MG CAPS Take 10 mg by mouth daily.    . citalopram (CELEXA) 20 MG tablet Take 20 mg by mouth daily.    Marland Kitchen doxazosin (CARDURA) 8 MG tablet TAKE 1 TABLET BY MOUTH  TWICE A DAY 60 tablet 2  . finasteride (PROSCAR) 5 MG tablet Take 5 mg by mouth daily.      . isosorbide mononitrate (IMDUR) 60 MG 24 hr tablet Take 1 tablet (60 mg total) by mouth 3 (three) times daily. 270 tablet 3  . meloxicam (MOBIC) 7.5 MG tablet Take 7.5 mg by mouth as needed.    . metoprolol succinate (TOPROL-XL) 50 MG 24 hr tablet Take 50 mg by mouth daily. Take with or immediately following a meal.    . Multiple Vitamin (MULTIVITAMIN WITH MINERALS) TABS  tablet Take 1 tablet by mouth daily.    . pantoprazole (PROTONIX) 40 MG tablet Take 1 tablet (40 mg total) by mouth daily. (Patient taking differently: Take 40 mg by mouth daily as needed. ) 30 tablet 2  . ranitidine (ZANTAC) 150 MG tablet Take 150 mg by mouth as needed.    Marland Kitchen rOPINIRole (REQUIP) 2 MG tablet Take 1 tablet by mouth 4  times daily 360 tablet 3  . Tamsulosin HCl (FLOMAX) 0.4 MG CAPS Take 0.4 mg by mouth at bedtime.     . traZODone (DESYREL) 50 MG tablet Take 50 mg by mouth at bedtime.    Marland Kitchen warfarin (COUMADIN) 5 MG tablet Take as directed by [facility] 150 tablet 1   No current facility-administered medications on file prior to visit.     Allergies:   Levofloxacin and Sulfa antibiotics   Social History:  The patient  reports that he has quit smoking. He has never used smokeless tobacco. He reports that he drinks about 1.8 oz  of alcohol per week . He reports that he does not use drugs.   Family History:   family history includes Diabetes type II in his mother; Heart attack in his mother; Heart disease in his father and mother; Hypertension in his father, mother, and unknown relative; Stroke in his brother, brother, and father.    Review of Systems: Review of Systems  Constitutional: Negative.   Respiratory: Positive for shortness of breath.   Cardiovascular: Positive for leg swelling.  Gastrointestinal: Negative.        Abdominal bloating  Musculoskeletal: Negative.   Neurological: Negative.   Psychiatric/Behavioral: Negative.   All other systems reviewed and are negative.    PHYSICAL EXAM: VS:  BP (!) 164/63 (BP Location: Left Arm, Patient Position: Sitting, Cuff Size: Normal)   Pulse (!) 53   Ht  (1.803 m)   Wt 217 lb 8 oz (98.7 kg)   BMI 30.34 kg/m  , BMI Body mass index is 30.34 kg/m.  GEN: Well nourished, well developed, in no acute distress  HEENT: normal  Neck: no JVD, carotid bruits, or masses Cardiac: RRR; no murmurs, rubs, or gallops, 2+   pitting edema  Respiratory:  clear to auscultation bilaterally, normal work of breathing GI: soft, nontender, nondistended, + BS MS: no deformity or atrophy  Skin: warm and dry, no rash Neuro:  Strength and sensation are intact Psych: euthymic mood, full affect    Recent Labs: 02/18/2017: TSH 3.090 02/22/2017: ALT 15; Hemoglobin 12.3; Platelets 233 03/20/2017: BUN 21; Creatinine, Ser 1.06; Potassium 5.0; Sodium 132    Lipid Panel No results found for: CHOL, HDL, LDLCALC, TRIG    Wt Readings from Last 3 Encounters:  12/09/16 200 lb (90.7 kg)  11/08/16 183 lb (83 kg) (wrong weight?)  10/15/16 202 lb 4 oz (91.7 kg)   Weight today on 08/13/2017 is 217 pounds    ASSESSMENT AND PLAN:  Essential hypertension - Plan: EKG 12-Lead We have confirmed that he is not taking amlodipine. He has worsening leg swelling We will increase Lasix 40 mg twice a day for 2 or 3 days then down to Lasix 40 mg daily with potassium Continue clonidine, isosorbide, Cardura, Benicar  Coronary artery disease involving native coronary artery of native heart without angina pectoris - Plan: EKG 12-Lead Currently with no symptoms of angina. No further workup at this time. Continue current medication regimen. Stable  S/P AVR (aortic valve replacement) Well-functioning aortic valve Tolerating anticoagulation  SOB (shortness of breath) Increase Lasix as above  Lower extremity edema Likely diastolic CHF Long discussion concerning salt, fluid intake. He denies both Will likely need Lasix 20-40 mg daily long-term We'll use higher dose Lasix as detailed above for now given worsening leg swelling and weight gain  Recommended he call our office next week to give [place] an update on his leg swelling and weight    Total encounter time more than 45 minutes  Greater than 50% was spent in counseling and coordination of care with the patient   Disposition:   F/U  2 months   Orders Placed This Encounter  Procedures   . EKG 12-Lead     Signed, Dossie Arbour, M.D., Ph.D. 08/13/2017  [facility] [place], [place] [phone]

## 2017-08-12 NOTE — Telephone Encounter (Signed)
Spoke with patient and he reports that he is not taking the amlodipine. Reviewed with him that this could cause swelling. Let him know that since he has had weight gain and swelling then we need to have him come in to be seen. He verbalized understanding and was agreeable with this plan. Let him know that I would have someone call him from scheduling to get this set up. He was appreciative for the call and had no further questions at this time.

## 2017-08-12 NOTE — Telephone Encounter (Signed)
Spoke with patient scheduled tomorrow at 320

## 2017-08-13 ENCOUNTER — Encounter: Payer: Self-pay | Admitting: Cardiovascular Disease

## 2017-08-13 ENCOUNTER — Ambulatory Visit (INDEPENDENT_AMBULATORY_CARE_PROVIDER_SITE_OTHER): Payer: Medicare Other

## 2017-08-13 ENCOUNTER — Ambulatory Visit (INDEPENDENT_AMBULATORY_CARE_PROVIDER_SITE_OTHER): Payer: Medicare Other | Admitting: Cardiovascular Disease

## 2017-08-13 VITALS — BP 164/63 | HR 53 | Ht 71.0 in | Wt 217.5 lb

## 2017-08-13 DIAGNOSIS — I10 Essential (primary) hypertension: Secondary | ICD-10-CM | POA: Diagnosis not present

## 2017-08-13 DIAGNOSIS — Z7901 Long term (current) use of anticoagulants: Secondary | ICD-10-CM

## 2017-08-13 DIAGNOSIS — Z7189 Other specified counseling: Secondary | ICD-10-CM

## 2017-08-13 DIAGNOSIS — Z952 Presence of prosthetic heart valve: Secondary | ICD-10-CM

## 2017-08-13 DIAGNOSIS — R600 Localized edema: Secondary | ICD-10-CM | POA: Diagnosis not present

## 2017-08-13 DIAGNOSIS — R001 Bradycardia, unspecified: Secondary | ICD-10-CM | POA: Diagnosis not present

## 2017-08-13 DIAGNOSIS — I350 Nonrheumatic aortic (valve) stenosis: Secondary | ICD-10-CM

## 2017-08-13 DIAGNOSIS — E871 Hypo-osmolality and hyponatremia: Secondary | ICD-10-CM | POA: Diagnosis not present

## 2017-08-13 LAB — POCT INR: INR: 3.6

## 2017-08-13 MED ORDER — FUROSEMIDE 20 MG PO TABS: 40.0000 mg | ORAL_TABLET | Freq: Two times a day (BID) | ORAL | 3 refills | Status: DC | PRN

## 2017-08-13 MED ORDER — POTASSIUM CHLORIDE ER 10 MEQ PO TBCR: 10.0000 meq | EXTENDED_RELEASE_TABLET | Freq: Every day | ORAL | 6 refills | Status: DC

## 2017-08-13 NOTE — Patient Instructions (Addendum)
Medication Instructions:   Please take lasix 40 mg daily Ok to take lasix 40 twice a day for 3 days then down to 40 mg daily until swelling improves  Once swelling resolves, Take lasix as needed  Take potassium one a day when you take lasix  Compression hose Leg elevation   Labwork:  No new labs needed  Testing/Procedures:  No further testing at this time   Follow-Up: It was a pleasure seeing you in the office today. Please call us if you have new issues that need to be addressed before your next appt.  [phone]  Your physician wants you to follow-up in: 2 months.  You will receive a reminder letter in the mail two months in advance. If you don't receive a letter, please call our office to schedule the follow-up appointment.  If you need a refill on your cardiac medications before your next appointment, please call your pharmacy.

## 2017-08-13 NOTE — Addendum Note (Signed)
Addended by: Antonieta Iba on: 08/13/2017 06:26 PM   Modules accepted: Level of Service

## 2017-08-16 ENCOUNTER — Emergency Department
Admission: EM | Admit: 2017-08-16 | Discharge: 2017-08-16 | Disposition: A | Discharge status: Home / Self Care | Payer: Medicare Other | Attending: Emergency Medicine | Admitting: Emergency Medicine

## 2017-08-16 ENCOUNTER — Emergency Department: Payer: Medicare Other

## 2017-08-16 DIAGNOSIS — I5032 Chronic diastolic (congestive) heart failure: Secondary | ICD-10-CM | POA: Insufficient documentation

## 2017-08-16 DIAGNOSIS — Z952 Presence of prosthetic heart valve: Secondary | ICD-10-CM | POA: Diagnosis not present

## 2017-08-16 DIAGNOSIS — I10 Essential (primary) hypertension: Secondary | ICD-10-CM | POA: Diagnosis present

## 2017-08-16 DIAGNOSIS — Z96649 Presence of unspecified artificial hip joint: Secondary | ICD-10-CM | POA: Insufficient documentation

## 2017-08-16 DIAGNOSIS — Z87891 Personal history of nicotine dependence: Secondary | ICD-10-CM | POA: Insufficient documentation

## 2017-08-16 DIAGNOSIS — Z79899 Other long term (current) drug therapy: Secondary | ICD-10-CM | POA: Diagnosis not present

## 2017-08-16 DIAGNOSIS — Z7901 Long term (current) use of anticoagulants: Secondary | ICD-10-CM | POA: Diagnosis not present

## 2017-08-16 DIAGNOSIS — I110 Hypertensive heart disease with heart failure: Secondary | ICD-10-CM | POA: Diagnosis not present

## 2017-08-16 LAB — CBC
HCT: 35.7 % — ABNORMAL LOW (ref 40.0–52.0)
HEMOGLOBIN: 12.3 g/dL — AB (ref 13.0–18.0)
MCH: 30.3 pg (ref 26.0–34.0)
MCHC: 34.4 g/dL (ref 32.0–36.0)
MCV: 88.1 fL (ref 80.0–100.0)
PLATELETS: 159 10*3/uL (ref 150–440)
RBC: 4.06 MIL/uL — ABNORMAL LOW (ref 4.40–5.90)
RDW: 14.5 % (ref 11.5–14.5)
WBC: 5.6 10*3/uL (ref 3.8–10.6)

## 2017-08-16 LAB — BASIC METABOLIC PANEL
ANION GAP: 9 (ref 5–15)
BUN: 23 mg/dL — ABNORMAL HIGH (ref 6–20)
CALCIUM: 8.9 mg/dL (ref 8.9–10.3)
CO2: 25 mmol/L (ref 22–32)
CREATININE: 0.99 mg/dL (ref 0.61–1.24)
Chloride: 97 mmol/L — ABNORMAL LOW (ref 101–111)
GFR calc Af Amer: >60 mL/min (ref >60)
GLUCOSE: 106 mg/dL — AB (ref 65–99)
Potassium: 4.6 mmol/L (ref 3.5–5.1)
Sodium: 131 mmol/L — ABNORMAL LOW (ref 135–145)

## 2017-08-16 LAB — TROPONIN I

## 2017-08-16 NOTE — ED Notes (Signed)
Pt trying to sit up to side of bed stating he is trying to see his blood pressure. Helped him to lay back down - got him a warm blanket and advised him to not worry about the bp, that we will take care of it while he is here and he agreed. Pt states he has been having trouble with his blood pressure "for awhile" and his pcp has been adjusting his medications to try and lower.

## 2017-08-16 NOTE — Discharge Instructions (Signed)
Fortunately today your blood work your EKG and your chest x-ray were all reassuring. Please continue taking all 4 of your antihypertensive medications as prescribed and follow up with her cardiologist this coming week for a recheck. Return to the emergency department for any concerns.  It was a pleasure to take care of you today, and thank you for coming to our emergency department.  If you have any questions or concerns before leaving please ask the nurse to grab me and I'm more than happy to go through your aftercare instructions again.  If you were prescribed any opioid pain medication today such as Norco, Vicodin, Percocet, morphine, hydrocodone, or oxycodone please make sure you do not drive when you are taking this medication as it can alter your ability to drive safely.  If you have any concerns once you are home that you are not improving or are in fact getting worse before you can make it to your follow-up appointment, please do not hesitate to call [phone] and come back for further evaluation.  Merrily Brittle, MD  Results for orders placed or performed during the hospital encounter of 08/16/17  Basic metabolic panel  Result Value Ref Range   Sodium 131 (L) 135 - 145 mmol/L   Potassium 4.6 3.5 - 5.1 mmol/L   Chloride 97 (L) 101 - 111 mmol/L   CO2 25 22 - 32 mmol/L   Glucose, Bld 106 (H) 65 - 99 mg/dL   BUN 23 (H) 6 - 20 mg/dL   Creatinine, Ser 4.69 0.61 - 1.24 mg/dL   Calcium 8.9 8.9 - 62.9 mg/dL   GFR calc non Af Amer >60 >60 mL/min   GFR calc Af Amer >60 >60 mL/min   Anion gap 9 5 - 15  CBC  Result Value Ref Range   WBC 5.6 3.8 - 10.6 K/uL   RBC 4.06 (L) 4.40 - 5.90 MIL/uL   Hemoglobin 12.3 (L) 13.0 - 18.0 g/dL   HCT 52.8 (L) 41.3 - 24.4 %   MCV 88.1 80.0 - 100.0 fL   MCH 30.3 26.0 - 34.0 pg   MCHC 34.4 32.0 - 36.0 g/dL   RDW 01.0 27.2 - 53.6 %   Platelets 159 150 - 440 K/uL  Troponin I  Result Value Ref Range   Troponin I <0.03 <0.03 ng/mL   Dg Chest 2 View  Result  Date: 08/16/2017 CLINICAL DATA:  Hypertension. EXAM: CHEST  2 VIEW COMPARISON:  07/31/2015 FINDINGS: Lungs are adequately inflated with mild chronic prominence of the perihilar markings. No lobar consolidation or effusion. Cardiomediastinal silhouette is within normal. There is mild calcified plaque over the thoracic aorta. Flattening of the hemidiaphragms on the lateral film. Evidence of known hiatal hernia. Mild degenerate change of the spine. Loose bodies and inferior to the coracoid process bilaterally at the shoulders. IMPRESSION: No acute cardiopulmonary disease. Chronic prominence of the perihilar markings with chin may be due to mild chronic vascular congestion. Hiatal hernia. Aortic Atherosclerosis (ICD10-I70.0). Electronically Signed   By: Elberta Fortis M.D.   On: 08/16/2017 21:49

## 2017-08-16 NOTE — ED Notes (Signed)
Reviewed d/c instructions, follow-up care with patient. Pt verbalized understanding.  

## 2017-08-16 NOTE — ED Triage Notes (Signed)
Patient c/o hypertension. PT over 200 systolic at home. Patient reports he took all of his morning meds. Patient denies headache, chest pain, visual loss and dizziness.

## 2017-08-16 NOTE — ED Provider Notes (Signed)
[facility] Emergency Department Provider Note  ____________________________________________   First MD Initiated Contact with Patient 08/16/17 2232     (approximate)  I have reviewed the triage vital signs and the nursing notes.   HISTORY  Chief Complaint Hypertension   HPI [name] is a 83 y.o. male who self presents to the emergency department with asymptomatic hypertension. He has a long-standing history of hypertension and is followed by Dr. Mariah Milling and takes for antihypertensive agents. Today he was at home and he checked his blood pressure and noted it was 215/100 which concerned him so he came to the emergency department. He denies chest pain, shortness of breath, abdominal pain, nausea, vomiting, headache, neck pain, double vision, blurred vision. He is supposed to be taking clonidine,isosorbide, Cardura, and Benicar. His medications were changed 3 days ago. The patient does report difficulty remembering his new regimen.   Past Medical History:  Diagnosis Date  . Anxiety 10/11  . Aortic stenosis    a. s/p mechcanical AVR, 2002  . Bradycardia    chronic, no symptoms 07/2010  . C. difficile colitis   . Carotid bruit    dopplers in past, no abnormalities  . Chronic diastolic CHF (congestive heart failure) (HCC)    a. echo 03/2015: EF 60-65%, no RWMA, GR1DD, aortic valve poorly visualized, unable to excluded vegetation, increased transvalvular velocity, mechanical valve is present, mild biatrial enalrgement, mild to mod MR, mod TR, PASP 65 mm Hg  . Coronary artery disease    a. mild, cath, 08/2010; b. medically managed  . Decreased hearing    Right ear  . Depression   . Gastric ulcer   . GERD (gastroesophageal reflux disease)   . Hypertension    BP higher than usual 04/19/10; amlodipine increased by telephone  . RLS (restless legs syndrome) 08/23/2015  . S/P AVR    a. [employer]. mechanical 2002; b. echo 08/2010 EF 60%, trival AI, mild  MR, AVR working well; c. on longterm warfarin tx  . SOB (shortness of breath) 10/11   08/2010,Episodes at 5 AM, eventually felt to be anxiety, after complete workup including catheterization, pt greatly improved with anxiety meds 11/11    Patient Active Problem List   Diagnosis Date Noted  . Chronic hyponatremia 08/12/2017  . Encounter for anticoagulation discussion and counseling 02/24/2017  . Bilateral leg edema 09/25/2015  . RLS (restless legs syndrome) 08/23/2015  . GIB (gastrointestinal bleeding) 07/31/2015  . Bleeding gastric ulcer 05/26/2015  . MRSA bacteremia 05/26/2015  . C. difficile colitis 05/26/2015  . GI bleed 04/16/2015  . Blood loss anemia   . Bleeding gastrointestinal   . S/P AVR (aortic valve replacement)   . Anemia 04/02/2015  . GERD (gastroesophageal reflux disease)   . Bradycardia   . Depression   . Anxiety   . SOB (shortness of breath)   . Hypertension   . Decreased hearing   . Coronary artery disease   . Aortic stenosis   . S/P AVR   . Warfarin anticoagulation   . Ejection fraction   . Carotid bruit     Past Surgical History:  Procedure Laterality Date  . CARDIAC CATHETERIZATION    . ESOPHAGOGASTRODUODENOSCOPY N/A 04/05/2015   Procedure: ESOPHAGOGASTRODUODENOSCOPY (EGD);  Surgeon: Scot Jun, MD;  Location: [facility] ENDOSCOPY;  Service: Endoscopy;  Laterality: N/A;  . ESOPHAGOGASTRODUODENOSCOPY N/A 04/17/2015   Procedure: ESOPHAGOGASTRODUODENOSCOPY (EGD);  Surgeon: Scot Jun, MD;  Location: [facility] ENDOSCOPY;  Service: Endoscopy;  Laterality: N/A;  .  ESOPHAGOGASTRODUODENOSCOPY N/A 08/02/2015   Procedure: ESOPHAGOGASTRODUODENOSCOPY (EGD);  Surgeon: Wallace Cullens, MD;  Location: [facility] END[facility]COPY;  Service: Endoscopy;  Laterality: N/A;  . HERNIA REPAIR    . JOINT REPLACEMENT    . TOTAL HIP ARTHROPLASTY    . VALVE REPLACEMENT  1/02   Aortic; echo 3/09 valve working well; echo 10/11 working well; put on Coumadin    Prior to Admission medications     Medication Sig Start Date End Date Taking? Authorizing Provider  acetaminophen (TYLENOL) 500 MG tablet Take 500 mg by mouth every 4 (four) hours as needed for mild pain or headache.     [provider]  ALPRAZolam Prudy Feeler) 0.5 MG tablet Take 0.5 tablets (0.25 mg total) by mouth daily as needed for anxiety. Patient taking differently: Take 0.5 mg by mouth daily as needed for anxiety.  04/20/15   Auburn Bilberry, MD  amoxicillin (AMOXIL) 500 MG capsule Take 500 mg by mouth as needed. Dental procedures 05/21/17   [provider]  celecoxib (CELEBREX) 100 MG capsule Take 100 mg by mouth as needed.    [provider]  Cetirizine HCl 10 MG CAPS Take 10 mg by mouth daily.    [provider]  citalopram (CELEXA) 20 MG tablet Take 20 mg by mouth daily.    [provider]  cloNIDine (CATAPRES) 0.1 MG tablet Take 1 tablet (0.1 mg total) by mouth 2 (two) times daily. 08/13/17   Antonieta Iba, MD  doxazosin (CARDURA) 8 MG tablet TAKE 1 TABLET BY MOUTH  TWICE A DAY 07/07/17   Antonieta Iba, MD  finasteride (PROSCAR) 5 MG tablet Take 5 mg by mouth daily.      [provider]  furosemide (LASIX) 20 MG tablet Take 2 tablets (40 mg total) by mouth 2 (two) times daily as needed. 08/13/17   Antonieta Iba, MD  isosorbide mononitrate (IMDUR) 60 MG 24 hr tablet Take 1 tablet (60 mg total) by mouth 3 (three) times daily. 06/30/17   Antonieta Iba, MD  meloxicam (MOBIC) 7.5 MG tablet Take 7.5 mg by mouth as needed. 09/10/16   [provider]  metoprolol succinate (TOPROL-XL) 50 MG 24 hr tablet Take 50 mg by mouth daily. Take with or immediately following a meal.    [provider]  Multiple Vitamin (MULTIVITAMIN WITH MINERALS) TABS tablet Take 1 tablet by mouth daily.    [provider]  olmesartan (BENICAR) 40 MG tablet Take 1 tablet (40 mg total) by mouth daily. 08/13/17   Antonieta Iba, MD  pantoprazole (PROTONIX) 40 MG tablet  Take 1 tablet (40 mg total) by mouth daily. Patient taking differently: Take 40 mg by mouth daily as needed.  08/07/15   Altamese Dilling, MD  potassium chloride (K-DUR) 10 MEQ tablet Take 1 tablet (10 mEq total) by mouth daily. 08/13/17   Antonieta Iba, MD  ranitidine (ZANTAC) 150 MG tablet Take 150 mg by mouth as needed.    [provider]  rOPINIRole (REQUIP) 2 MG tablet Take 1 tablet by mouth 4  times daily 10/14/16   Butch Penny, NP  Tamsulosin HCl (FLOMAX) 0.4 MG CAPS Take 0.4 mg by mouth at bedtime.     [provider]  traZODone (DESYREL) 50 MG tablet Take 50 mg by mouth at bedtime.    [provider]  warfarin (COUMADIN) 5 MG tablet Take as directed by [facility] 06/23/17   Antonieta Iba, MD    Allergies Levofloxacin  and Sulfa antibiotics  Family History  Problem Relation Age of Onset  . Hypertension Mother   . Diabetes type II Mother   . Heart disease Mother   . Heart attack Mother   . Hypertension Father   . Heart disease Father   . Stroke Father   . Stroke Brother   . Stroke Brother   . Hypertension Unknown     Social History Social History  Substance Use Topics  . Smoking status: Former Games developer  . Smokeless tobacco: Never Used  . Alcohol use 1.8 oz/week    3 Glasses of wine per week     Comment: Social    Review of Systems Constitutional: No fever/chills ENT: No sore throat. Cardiovascular: Denies chest pain. Respiratory: Denies shortness of breath. Gastrointestinal: No abdominal pain.  No nausea, no vomiting.  No diarrhea.  No constipation. Musculoskeletal: Negative for back pain. Neurological: Negative for headaches   ____________________________________________   PHYSICAL EXAM:  VITAL SIGNS: ED Triage Vitals  Enc Vitals Group     BP 08/16/17 2117 (!) 211/66     Pulse Rate 08/16/17 2117 (!) 46     Resp 08/16/17 2117 18     Temp 08/16/17 2117 97.8 F (36.6 C)     Temp Source 08/16/17 2117 Oral      SpO2 08/16/17 2117 96 %     Weight 08/16/17 2118 217 lb (98.4 kg)     Height 08/16/17 2118  (1.803 m)     Head Circumference --      Peak Flow --      Pain Score --      Pain Loc --      Pain Edu? --      Excl. in GC? --     Constitutional: Alert and oriented 4 well appearing nontoxic no diaphoresis speaks full clear sentences Head: Atraumatic. Nose: No congestion/rhinnorhea. Mouth/Throat: No trismus Neck: No stridor.   Cardiovascular: Regular rate and rhythm Respiratory: Normal respiratory effort.  No retractions. Gastrointestinal: Soft nontender Neurologic:  Normal speech and language. No gross focal neurologic deficits are appreciated.  Skin:  Skin is warm, dry and intact. No rash noted.    ____________________________________________  LABS (all labs ordered are listed, but only abnormal results are displayed)  Labs Reviewed  BASIC METABOLIC PANEL - Abnormal; Notable for the following:       Result Value   Sodium 131 (*)    Chloride 97 (*)    Glucose, Bld 106 (*)    BUN 23 (*)    All other components within normal limits  CBC - Abnormal; Notable for the following:    RBC 4.06 (*)    Hemoglobin 12.3 (*)    HCT 35.7 (*)    All other components within normal limits  TROPONIN I    Blood work reviewed and interpreted by me shows no signs of end organ damage __________________________________________  EKG  ED ECG REPORT I, Merrily Brittle, the attending physician, personally viewed and interpreted this ECG.  Date: 08/16/2017 EKG Time:  Rate: 46 Rhythm: Sinus bradycardia QRS Axis: normal Intervals: normal ST/T Wave abnormalities: normal Narrative Interpretation: no evidence of acute ischemia  ____________________________________________  RADIOLOGY   ____________________________________________   PROCEDURES  Procedure(s) performed: no  Procedures  Critical Care performed: no  Observation:  no ____________________________________________   INITIAL IMPRESSION / ASSESSMENT AND PLAN / ED COURSE  Pertinent labs & imaging results that were available during my care of the patient were reviewed by me  and considered in my medical decision making (see chart for details).  The patient is very well-appearing and hemodynamically stable. By the time I saw him his blood pressure is 170/70. He became scared at home and he's been confused by his recent blood pressure medication changes. I reviewed his current regimen with him and encouraged him to follow up with Dr. Thedora Hinders in this coming week for reevaluation should he remain confused. He is discharged home in improved condition.      ____________________________________________   FINAL CLINICAL IMPRESSION(S) / ED DIAGNOSES  Final diagnoses:  Asymptomatic hypertension      NEW MEDICATIONS STARTED DURING THIS VISIT:  Discharge Medication List as of 08/16/2017 10:39 PM       Note:  This document was prepared using Dragon voice recognition software and may include unintentional dictation errors.      Merrily Brittle, MD 08/17/17 1408

## 2017-08-18 ENCOUNTER — Other Ambulatory Visit: Payer: Self-pay | Admitting: *Deleted

## 2017-08-18 ENCOUNTER — Other Ambulatory Visit: Payer: Self-pay

## 2017-08-18 MED ORDER — CLONIDINE HCL 0.1 MG PO TABS: 0.1000 mg | ORAL_TABLET | Freq: Two times a day (BID) | ORAL | 2 refills | Status: DC

## 2017-08-18 NOTE — Telephone Encounter (Signed)
90 day supply

## 2017-08-20 ENCOUNTER — Telehealth: Payer: Self-pay | Admitting: Cardiovascular Disease

## 2017-08-20 ENCOUNTER — Other Ambulatory Visit: Payer: Self-pay

## 2017-08-20 MED ORDER — CLONIDINE HCL 0.1 MG PO TABS: 0.1000 mg | ORAL_TABLET | Freq: Two times a day (BID) | ORAL | 2 refills | Status: DC

## 2017-08-20 NOTE — Telephone Encounter (Signed)
Per Dr. Windell Hummingbird note he is on both.

## 2017-08-20 NOTE — Telephone Encounter (Signed)
Please review medications. Both medications are on patients medication list. I am willing to call pharmacy back for you, just let me know.  Thank you!

## 2017-08-20 NOTE — Telephone Encounter (Signed)
Spoke with priscilla from [employer] verifying that per Dr. Windell Hummingbird last office not patient was to continue both Clonidine and Cardura.

## 2017-08-20 NOTE — Telephone Encounter (Signed)
Pharmacy calling needing a call back  Needing to know if the Clonidine and Cardura are still active  Please call back     Ref # [id number]

## 2017-08-20 NOTE — Telephone Encounter (Signed)
Requested Prescriptions   Signed Prescriptions Disp Refills  . cloNIDine (CATAPRES) 0.1 MG tablet 180 tablet 2    Sig: Take 1 tablet (0.1 mg total) by mouth 2 (two) times daily.    Authorizing Provider: Antonieta Iba    Ordering User: Margrett Rud

## 2017-08-29 ENCOUNTER — Telehealth: Payer: Self-pay | Admitting: Cardiovascular Disease

## 2017-08-29 NOTE — Telephone Encounter (Signed)
Spoke with patient and reviewed that he should maybe start off with the 15-20 range. The higher range is sometimes more difficult to get on. He verbalized understanding of our conversation with no further questions or concerns at this time.

## 2017-08-29 NOTE — Telephone Encounter (Signed)
Pt states DR. Gollan recommended he have compression socks. Please call and advise if it should be 15-20 or 20-30.

## 2017-09-03 ENCOUNTER — Ambulatory Visit (INDEPENDENT_AMBULATORY_CARE_PROVIDER_SITE_OTHER): Payer: Medicare Other

## 2017-09-03 DIAGNOSIS — I350 Nonrheumatic aortic (valve) stenosis: Secondary | ICD-10-CM

## 2017-09-03 DIAGNOSIS — Z952 Presence of prosthetic heart valve: Secondary | ICD-10-CM | POA: Diagnosis not present

## 2017-09-03 DIAGNOSIS — Z7901 Long term (current) use of anticoagulants: Secondary | ICD-10-CM | POA: Diagnosis not present

## 2017-09-03 LAB — POCT INR: INR: 3.3

## 2017-09-04 ENCOUNTER — Other Ambulatory Visit: Payer: Self-pay | Admitting: Adult Health

## 2017-09-10 ENCOUNTER — Other Ambulatory Visit: Payer: Self-pay

## 2017-09-10 ENCOUNTER — Telehealth: Payer: Self-pay | Admitting: Cardiovascular Disease

## 2017-09-10 MED ORDER — FUROSEMIDE 20 MG PO TABS: 40.0000 mg | ORAL_TABLET | Freq: Two times a day (BID) | ORAL | 3 refills | Status: DC | PRN

## 2017-09-10 MED ORDER — POTASSIUM CHLORIDE ER 10 MEQ PO TBCR
10.0000 meq | EXTENDED_RELEASE_TABLET | Freq: Every day | ORAL | 3 refills | Status: DC
Start: 2017-09-10 — End: 2019-02-08

## 2017-09-10 NOTE — Telephone Encounter (Signed)
90 day supply

## 2017-09-12 NOTE — Telephone Encounter (Signed)
Patient declined earlier appointment at this time stating that he feels much better and had no further concerns at this time. Confirmed appointment scheduled 09/26/17

## 2017-09-12 NOTE — Telephone Encounter (Signed)
Spoke with patient to offer him a earlier appointment and he declined at this time stating that he feels better. He did confirm upcoming appointment for 09/26/17 with Dr. Mariah MillingGollan and had no further questions at this time. Reviewed that Dr. Mariah MillingGollan would like for him to be seen in our CHF clinic and he was agreeable with this plan. Instructed him to please call if he should have any further questions or concerns.

## 2017-09-12 NOTE — Telephone Encounter (Signed)
l mom to see if pt can come in on 10/22 2:20 with Dr. Mariah MillingGollan

## 2017-09-12 NOTE — Telephone Encounter (Signed)
-----   Message from Antonieta Ibaimothy J Gollan, MD sent at 09/11/2017  1:51 PM EDT ----- Needs earlier appt  For CHF Also needs appt with CHF clinic [facility]

## 2017-09-19 ENCOUNTER — Other Ambulatory Visit: Payer: Self-pay

## 2017-09-19 MED ORDER — ISOSORBIDE MONONITRATE ER 60 MG PO TB24: 60.0000 mg | ORAL_TABLET | Freq: Three times a day (TID) | ORAL | 3 refills | Status: DC

## 2017-09-19 NOTE — Telephone Encounter (Signed)
Requested Prescriptions   Signed Prescriptions Disp Refills  . isosorbide mononitrate (IMDUR) 60 MG 24 hr tablet 270 tablet 3    Sig: Take 1 tablet (60 mg total) by mouth 3 (three) times daily.    Authorizing Provider: Antonieta IbaGOLLAN, TIMOTHY J    Ordering User: Margrett RudSLAYTON, BRITTANY N

## 2017-09-23 ENCOUNTER — Other Ambulatory Visit: Payer: Self-pay | Admitting: Cardiovascular Disease

## 2017-09-23 ENCOUNTER — Other Ambulatory Visit: Payer: Self-pay | Admitting: Physician Assistant

## 2017-09-23 ENCOUNTER — Ambulatory Visit: Payer: Medicare Other | Attending: Family | Admitting: Family

## 2017-09-23 ENCOUNTER — Other Ambulatory Visit: Payer: Self-pay | Admitting: *Deleted

## 2017-09-23 VITALS — BP 139/53 | HR 53 | Resp 18 | Ht 68.0 in | Wt 208.5 lb

## 2017-09-23 DIAGNOSIS — Z7901 Long term (current) use of anticoagulants: Secondary | ICD-10-CM | POA: Diagnosis not present

## 2017-09-23 DIAGNOSIS — Z952 Presence of prosthetic heart valve: Secondary | ICD-10-CM | POA: Insufficient documentation

## 2017-09-23 DIAGNOSIS — I10 Essential (primary) hypertension: Secondary | ICD-10-CM

## 2017-09-23 DIAGNOSIS — R0683 Snoring: Secondary | ICD-10-CM | POA: Diagnosis not present

## 2017-09-23 DIAGNOSIS — I5032 Chronic diastolic (congestive) heart failure: Secondary | ICD-10-CM | POA: Diagnosis not present

## 2017-09-23 DIAGNOSIS — Z96649 Presence of unspecified artificial hip joint: Secondary | ICD-10-CM | POA: Insufficient documentation

## 2017-09-23 DIAGNOSIS — G2581 Restless legs syndrome: Secondary | ICD-10-CM | POA: Diagnosis not present

## 2017-09-23 DIAGNOSIS — I110 Hypertensive heart disease with heart failure: Secondary | ICD-10-CM | POA: Diagnosis not present

## 2017-09-23 DIAGNOSIS — I890 Lymphedema, not elsewhere classified: Secondary | ICD-10-CM | POA: Diagnosis not present

## 2017-09-23 DIAGNOSIS — Z79899 Other long term (current) drug therapy: Secondary | ICD-10-CM | POA: Insufficient documentation

## 2017-09-23 DIAGNOSIS — F419 Anxiety disorder, unspecified: Secondary | ICD-10-CM | POA: Insufficient documentation

## 2017-09-23 DIAGNOSIS — K219 Gastro-esophageal reflux disease without esophagitis: Secondary | ICD-10-CM | POA: Diagnosis not present

## 2017-09-23 DIAGNOSIS — I2510 Atherosclerotic heart disease of native coronary artery without angina pectoris: Secondary | ICD-10-CM | POA: Insufficient documentation

## 2017-09-23 DIAGNOSIS — F329 Major depressive disorder, single episode, unspecified: Secondary | ICD-10-CM | POA: Insufficient documentation

## 2017-09-23 DIAGNOSIS — Z8719 Personal history of other diseases of the digestive system: Secondary | ICD-10-CM | POA: Insufficient documentation

## 2017-09-23 DIAGNOSIS — Z9889 Other specified postprocedural states: Secondary | ICD-10-CM | POA: Diagnosis not present

## 2017-09-23 DIAGNOSIS — Z87891 Personal history of nicotine dependence: Secondary | ICD-10-CM | POA: Diagnosis not present

## 2017-09-23 DIAGNOSIS — R0602 Shortness of breath: Secondary | ICD-10-CM | POA: Diagnosis present

## 2017-09-23 DIAGNOSIS — H9191 Unspecified hearing loss, right ear: Secondary | ICD-10-CM | POA: Diagnosis not present

## 2017-09-23 MED ORDER — ISOSORBIDE MONONITRATE ER 60 MG PO TB24: 60.0000 mg | ORAL_TABLET | Freq: Three times a day (TID) | ORAL | 3 refills | Status: DC

## 2017-09-23 NOTE — Patient Instructions (Signed)
Continue weighing daily and call for an overnight weight gain of > 2 pounds or a weekly weight gain of >5 pounds. 

## 2017-09-23 NOTE — Telephone Encounter (Signed)
Review refill

## 2017-09-23 NOTE — Progress Notes (Signed)
Patient ID: [name], male    DOB: July 30, 1931, 83 y.o.   MRN: [id number]  HPI  [name] is a 83 y/o male with a history of anxiety, c. Diff, CAD, depression, GERD, GI bleed, HTN, restless leg syndrome, prior tobacco use and chronic heart failure.   Echo from 02/21/17 shows an EF of 55-60% along with severe TR and moderated elevated PA pressure of 54 mm Hg.   Was in the ED 08/16/17 due to HTN. Patient was treated and released.   He presents today for his initial visit with a chief complaint of moderate shortness of breath with minimal exertion. He describes this as chronic in nature having been present for several years with varying levels of severity. He has associated fatigue, edema, dizziness and difficulty sleeping. He denies any chest pain or weight gain. Does endorse snoring as well as apneic episodes. Daughter that is with him confirms this.   Past Medical History:  Diagnosis Date  . Anxiety 10/11  . Aortic stenosis    a. s/p mechcanical AVR, 2002  . Bradycardia    chronic, no symptoms 07/2010  . C. difficile colitis   . Carotid bruit    dopplers in past, no abnormalities  . Chronic diastolic CHF (congestive heart failure) (HCC)    a. echo 03/2015: EF 60-65%, no RWMA, GR1DD, aortic valve poorly visualized, unable to excluded vegetation, increased transvalvular velocity, mechanical valve is present, mild biatrial enalrgement, mild to mod [name], mod TR, PASP 65 mm Hg  . Coronary artery disease    a. mild, cath, 08/2010; b. medically managed  . Decreased hearing    Right ear  . Depression   . Gastric ulcer   . GERD (gastroesophageal reflux disease)   . Hypertension    BP higher than usual 04/19/10; amlodipine increased by telephone  . RLS (restless legs syndrome) 08/23/2015  . S/P AVR    a. St. Jude. mechanical 2002; b. echo 08/2010 EF 60%, trival AI, mild [name], AVR working well; c. on longterm warfarin tx  . SOB (shortness of breath) 10/11   08/2010,Episodes at 5 AM,  eventually felt to be anxiety, after complete workup including catheterization, pt greatly improved with anxiety meds 11/11   Past Surgical History:  Procedure Laterality Date  . CARDIAC CATHETERIZATION    . ESOPHAGOGASTRODUODENOSCOPY N/A 04/05/2015   Procedure: ESOPHAGOGASTRODUODENOSCOPY (EGD);  Surgeon: Scot Jun, MD;  Location: [facility] ENDOSCOPY;  Service: Endoscopy;  Laterality: N/A;  . ESOPHAGOGASTRODUODENOSCOPY N/A 04/17/2015   Procedure: ESOPHAGOGASTRODUODENOSCOPY (EGD);  Surgeon: Scot Jun, MD;  Location: [facility] ENDOSCOPY;  Service: Endoscopy;  Laterality: N/A;  . ESOPHAGOGASTRODUODENOSCOPY N/A 08/02/2015   Procedure: ESOPHAGOGASTRODUODENOSCOPY (EGD);  Surgeon: Wallace Cullens, MD;  Location: [facility] ENDOSCOPY;  Service: Endoscopy;  Laterality: N/A;  . HERNIA REPAIR    . JOINT REPLACEMENT    . TOTAL HIP ARTHROPLASTY    . VALVE REPLACEMENT  1/02   Aortic; echo 3/09 valve working well; echo 10/11 working well; put on Coumadin   Family History  Problem Relation Age of Onset  . Hypertension Mother   . Diabetes type II Mother   . Heart disease Mother   . Heart attack Mother   . Hypertension Father   . Heart disease Father   . Stroke Father   . Stroke Brother   . Stroke Brother   . Hypertension Unknown    Social History  Substance Use Topics  . Smoking status: Former Games developer  . Smokeless tobacco: Never Used  .  Alcohol use 1.8 oz/week    3 Glasses of wine per week     Comment: Social   Allergies  Allergen Reactions  . Levofloxacin Nausea Only  . Sulfa Antibiotics Other (See Comments)    Reaction:  Unknown    Prior to Admission medications   Medication Sig Start Date End Date Taking? Authorizing Provider  acetaminophen (TYLENOL) 500 MG tablet Take 500 mg by mouth every 4 (four) hours as needed for mild pain or headache.    Yes [provider]  ALPRAZolam Prudy Feeler) 0.5 MG tablet Take 0.5 tablets (0.25 mg total) by mouth daily as needed for anxiety. Patient taking  differently: Take 0.5 mg by mouth daily as needed for anxiety.  04/20/15  Yes Auburn Bilberry, MD  amoxicillin (AMOXIL) 500 MG capsule Take 500 mg by mouth as needed. Dental procedures 05/21/17  Yes [provider]  celecoxib (CELEBREX) 100 MG capsule Take 100 mg by mouth as needed.   Yes [provider]  Cetirizine HCl 10 MG CAPS Take 10 mg by mouth daily.   Yes [provider]  citalopram (CELEXA) 20 MG tablet Take 20 mg by mouth daily.   Yes [provider]  cloNIDine (CATAPRES) 0.1 MG tablet Take 1 tablet (0.1 mg total) by mouth 2 (two) times daily. 08/20/17  Yes Gollan, Tollie Pizza, MD  doxazosin (CARDURA) 8 MG tablet TAKE 1 TABLET BY MOUTH  TWICE A DAY 09/23/17  Yes Gollan, Tollie Pizza, MD  finasteride (PROSCAR) 5 MG tablet Take 5 mg by mouth daily.     Yes [provider]  furosemide (LASIX) 20 MG tablet Take 2 tablets (40 mg total) by mouth 2 (two) times daily as needed. 09/10/17  Yes Antonieta Iba, MD  isosorbide mononitrate (IMDUR) 60 MG 24 hr tablet Take 60 mg by mouth 2 (two) times daily.   Yes [provider]  meloxicam (MOBIC) 7.5 MG tablet Take 7.5 mg by mouth as needed. 09/10/16  Yes [provider]  metoprolol succinate (TOPROL-XL) 50 MG 24 hr tablet TAKE 1 TABLET (50 MG) BY  MOUTH DAILY. TAKE WITH OR  IMMEDIATELY FOLLOWING A  MEAL. 09/23/17  Yes Dunn, Raymon Mutton, PA-C  Multiple Vitamin (MULTIVITAMIN WITH MINERALS) TABS tablet Take 1 tablet by mouth daily.   Yes [provider]  olmesartan (BENICAR) 40 MG tablet Take 1 tablet (40 mg total) by mouth daily. 08/13/17  Yes Gollan, Tollie Pizza, MD  pantoprazole (PROTONIX) 40 MG tablet Take 1 tablet (40 mg total) by mouth daily. Patient taking differently: Take 40 mg by mouth daily as needed.  08/07/15  Yes Altamese Dilling, MD  potassium chloride (K-DUR) 10 MEQ tablet Take 1 tablet (10 mEq total) by mouth daily. 09/10/17  Yes Gollan, Tollie Pizza, MD  ranitidine (ZANTAC) 150  MG tablet Take 150 mg by mouth as needed.   Yes [provider]  rOPINIRole (REQUIP) 2 MG tablet TAKE 1 TABLET BY MOUTH 4  TIMES DAILY 09/04/17  Yes York Spaniel, MD  Tamsulosin HCl (FLOMAX) 0.4 MG CAPS Take 0.4 mg by mouth at bedtime.    Yes [provider]  traZODone (DESYREL) 50 MG tablet Take 50 mg by mouth at bedtime.   Yes [provider]  warfarin (COUMADIN) 5 MG tablet TAKE AS DIRECTED BY  COUMADIN CLINIC 09/23/17  Yes Gollan, Tollie Pizza, MD   Review of Systems  Constitutional: Positive for fatigue. Negative for appetite change.  HENT: Positive for hearing loss. Negative for congestion,  rhinorrhea and sore throat.   Eyes: Negative.   Respiratory: Positive for shortness of breath. Negative for chest tightness.   Cardiovascular: Positive for leg swelling. Negative for chest pain and palpitations.  Gastrointestinal: Negative for abdominal distention and abdominal pain.  Endocrine: Negative.   Genitourinary: Negative.   Musculoskeletal: Negative for back pain and neck pain.  Skin: Negative.   Allergic/Immunologic: Negative.   Neurological: Positive for dizziness and light-headedness.  Hematological: Negative for adenopathy. Bruises/bleeds easily.  Psychiatric/Behavioral: Positive for sleep disturbance (interrrupted sleep). Negative for dysphoric mood. The patient is not nervous/anxious.    Vitals:   09/23/17 1225  BP: (!) 139/53  Pulse: (!) 53  Resp: 18  SpO2: 100%  Weight: 208 lb 8 oz (94.6 kg)  Height: 5\' 8"  (1.727 m)   Wt Readings from Last 3 Encounters:  09/23/17 208 lb 8 oz (94.6 kg)  08/16/17 217 lb (98.4 kg)  08/13/17 217 lb 8 oz (98.7 kg)   Lab Results  Component Value Date   CREATININE 0.99 08/16/2017   CREATININE 1.06 03/20/2017   CREATININE 0.84 02/22/2017    Physical Exam  Constitutional: He is oriented to person, place, and time. He appears well-developed and well-nourished.  HENT:  Head: Normocephalic and atraumatic.   Right Ear: Decreased hearing is noted.  Left Ear: Decreased hearing is noted.  Neck: Normal range of motion. Neck supple. No JVD present.  Cardiovascular: Regular rhythm.  Bradycardia present.   Pulmonary/Chest: Effort normal. No respiratory distress. He has no wheezes. He has no rales.  Abdominal: Soft. He exhibits no distension. There is no tenderness.  Musculoskeletal: He exhibits edema (3+ pitting edema in bilateral lower legs). He exhibits no tenderness.  Neurological: He is alert and oriented to person, place, and time.  Skin: Skin is warm and dry.  Psychiatric: He has a normal mood and affect. His behavior is normal. Thought content normal.  Nursing note and vitals reviewed.  Assessment & Plan:  1: Chronic heart failure with preserved ejection fraction- - NYHA class III - fluid overloaded with edema although both patient and daughter say that it's improving - weighing daily and he was instructed to call for an overnight weight gain of >2 pounds or a weekly weight gain of >5 pounds - not adding salt to his food but does eat often [place] [employer] [place] [employer] and he says that sometimes the food tastes salty. Discussed the importance of closely following a 2000mg  sodium diet and written dietary information was given to him. Reviewed how to read food labels for sodium content. - patient reports receiving the flu vaccine for this season - saw cardiologist Mariah Milling(Gollan) 08/13/17 and returns 09/26/17  2: HTN- - BP looks good today - saw PCP Letitia Libra([place]) 09/22/17 - BMP from 09/15/17 reviewed and shows sodium 131, potassium 4.5 and GFR 80  3: Snoring- - daughter says she has heard patient snore as well as stop breathing - patient says that he's woken himself up snoring - agreeable to a sleep study so will fax that referral  4: S/P AVR- - replacement with St Jude valve - goes to coumadin clinic for management - INR on 09/03/17 was 3.3  5: Lymphedema- - stage 2 - has worn TED  hose in the past but he felt like they made him itch; is agreeable to trying them again. Instructed to put them on in the morning with removal at bedtime - also encouraged him to elevate his legs more during the day - unable  to exercise due to his shortness of breath - discussed lymphapress compression boots and he is interested so a brochure was given to him. Will try TED hose and elevation of extremities first.   Patient did not bring his medications nor a list. Each medication was verbally reviewed with the patient and he was encouraged to bring the bottles to every visit to confirm accuracy of list. Patient unsure of accuracy of list that is in the computer. Daughter will take our list, knowing it could be incorrect, compare it to what his bottles say and make any corrections.   Return in 1 month or sooner for any questions/problems before then.

## 2017-09-24 ENCOUNTER — Ambulatory Visit (INDEPENDENT_AMBULATORY_CARE_PROVIDER_SITE_OTHER): Payer: Medicare Other

## 2017-09-24 DIAGNOSIS — I5032 Chronic diastolic (congestive) heart failure: Secondary | ICD-10-CM | POA: Insufficient documentation

## 2017-09-24 DIAGNOSIS — Z952 Presence of prosthetic heart valve: Secondary | ICD-10-CM

## 2017-09-24 DIAGNOSIS — Z7901 Long term (current) use of anticoagulants: Secondary | ICD-10-CM | POA: Diagnosis not present

## 2017-09-24 DIAGNOSIS — I350 Nonrheumatic aortic (valve) stenosis: Secondary | ICD-10-CM

## 2017-09-24 DIAGNOSIS — I890 Lymphedema, not elsewhere classified: Secondary | ICD-10-CM | POA: Insufficient documentation

## 2017-09-24 DIAGNOSIS — R0683 Snoring: Secondary | ICD-10-CM | POA: Insufficient documentation

## 2017-09-24 LAB — POCT INR: INR: 4.2

## 2017-09-24 NOTE — Progress Notes (Signed)
Cardiology Office Note  Date:  09/26/2017   ID:  [name], DOB [birth year], MRN [id number]  PCP:  Gracelyn Nurse, MD   Chief Complaint  Patient presents with  . OTHER    F/u ED hypertension . Meds reviewed verbally with pt.    HPI:  83 year old male with  h/o aortic stenosis  s/p St. Jude mechanical aortic valve replacement in 2002 on chronic Coumadin therapy,  mild CAD by cardiac cath in 2011 managed medically,  GERD,  HTN,  carotid bruits,  Chronic hyponatremia asymptomatic bradycardia,  RLS, anxiety, depression,  GI bleed osteoarthritis s/p total left hip replacement on 03/21/2015 who was discharged from [facility] the above surgery with a hgb of 8 (baseline 12), he was started on po iron replacement therapy, who presented to on 04/02/15 with acute onset of weakness, melena, BRBPR, and was found to have a hgb of 5.1. Aspirin and warfarin initially held, restarted at a later date, S/p EGD Ejection fraction greater than 55% March 2018, moderately elevated right heart pressures He presents today for follow-up of his aortic valve replacement And hypertension  Was in the ED 08/16/17 due to HTN. Patient was treated and released.  Blood pressure was 215/100  BP 190 in the evening Lasix 20 BID, still with swelling Weight at home 200 pounds Weight here 217 pounds now 211 pounds Blood pressure in the office today 90-100 systolic both arms  We have gone through his medications, He is taking the clonidine twice a day Cardura twice a day Lasix twice a day isosorbide twice a day metoprolol once a day in the evening Benicar once a day in the morning  Very somnolent in the office, falls asleep within 10 seconds Reports he has sleep  study scheduled He does not wear compression hose  EKG personally reviewed by myself on todays visit Shows sinus bradycardia rate 52 bpm no significant ST or T-wave changes  Other past medical history reviewed Recently seen in the emergency room  Twice for hypertension On a prior office visit I recommended he increase isosorbide to 30 mg twice a day Also was recommended to double his Diovan dose He was given 1 dose clonidine in the emergency room On a follow-up visit with ryan Dunn it was recommended he increase isosorbide to 30 mg 3 times a day, increase metoprolol up to 50 mg daily He showed up and began in the emergency room several days later  Previous Echocardiogram shows normal LV function, well-functioning aortic valve, Moderately elevated right heart pressures with severe TR  Not on hydralazine secondary to side effects Clonidine was fatigue dry mouth Prior notes indicating amlodipine 10 mg caused leg swelling,  Though he reports stopping the amlodipine did not change his swelling  Other past medical history reviewed 48 hour Holter monitor 12/2015 Normal sinus rhythm Average heart beat 70 bpm, maximum heart rate 114 bpm, minimum 45 bpm (at 3 AM) Frequent APCs, couplets, bigeminy's with short runs, longest run of 5 beats (12% of all beats),    leg edema is better by holding amlodipine 10 mg Previous drop in Hemoglobin 9 improved up to 14  Previous Echocardiogram for Feb 22nd 2017 reviewed with him showing well-functioning aortic valve  Previous cardiac catheterization report from 2011 reviewed with him showing 20% proximal LAD, 40% mid LAD disease  PMH:   has a past medical history of Anxiety (10/11); Aortic stenosis; Bradycardia; C. difficile colitis; Carotid bruit; Chronic diastolic CHF (congestive heart failure) (HCC); Coronary  artery disease; Decreased hearing; Depression; Gastric ulcer; GERD (gastroesophageal reflux disease); Hypertension; RLS (restless legs syndrome) (08/23/2015); S/P AVR; and SOB (shortness of breath) (10/11).  PSH:    Past Surgical History:  Procedure Laterality Date  . CARDIAC CATHETERIZATION    . ESOPHAGOGASTRODUODENOSCOPY N/A 04/05/2015   Procedure: ESOPHAGOGASTRODUODENOSCOPY (EGD);   Surgeon: Scot Jun, MD;  Location: [facility] ENDOSCOPY;  Service: Endoscopy;  Laterality: N/A;  . ESOPHAGOGASTRODUODENOSCOPY N/A 04/17/2015   Procedure: ESOPHAGOGASTRODUODENOSCOPY (EGD);  Surgeon: Scot Jun, MD;  Location: [facility] ENDOSCOPY;  Service: Endoscopy;  Laterality: N/A;  . ESOPHAGOGASTRODUODENOSCOPY N/A 08/02/2015   Procedure: ESOPHAGOGASTRODUODENOSCOPY (EGD);  Surgeon: Wallace Cullens, MD;  Location: [facility] ENDOSCOPY;  Service: Endoscopy;  Laterality: N/A;  . HERNIA REPAIR    . JOINT REPLACEMENT    . TOTAL HIP ARTHROPLASTY    . VALVE REPLACEMENT  1/02   Aortic; echo 3/09 valve working well; echo 10/11 working well; put on Coumadin    Current Outpatient Prescriptions on File Prior to Visit  Medication Sig Dispense Refill  . acetaminophen (TYLENOL) 500 MG tablet Take 500 mg by mouth every 4 (four) hours as needed for mild pain or headache.     . ALPRAZolam (XANAX) 0.5 MG tablet Take 0.5 tablets (0.25 mg total) by mouth daily as needed for anxiety. (Patient taking differently: Take 0.5 mg by mouth daily as needed for anxiety. ) 10 tablet 0  . amoxicillin (AMOXIL) 500 MG capsule Take 500 mg by mouth as needed. Dental procedures  1  . celecoxib (CELEBREX) 100 MG capsule Take 100 mg by mouth as needed.    . Cetirizine HCl 10 MG CAPS Take 10 mg by mouth daily.    . citalopram (CELEXA) 20 MG tablet Take 20 mg by mouth daily.    . cloNIDine (CATAPRES) 0.1 MG tablet Take 1 tablet (0.1 mg total) by mouth 2 (two) times daily. 180 tablet 2  . doxazosin (CARDURA) 8 MG tablet TAKE 1 TABLET BY MOUTH  TWICE A DAY 180 tablet 1  . finasteride (PROSCAR) 5 MG tablet Take 5 mg by mouth daily.      . furosemide (LASIX) 20 MG tablet Take 2 tablets (40 mg total) by mouth 2 (two) times daily as needed. 180 tablet 3  . isosorbide mononitrate (IMDUR) 60 MG 24 hr tablet Take 60 mg by mouth 2 (two) times daily.    . meloxicam (MOBIC) 7.5 MG tablet Take 7.5 mg by mouth as needed.    . metoprolol succinate  (TOPROL-XL) 50 MG 24 hr tablet TAKE 1 TABLET (50 MG) BY  MOUTH DAILY. TAKE WITH OR  IMMEDIATELY FOLLOWING A  MEAL. 90 tablet 2  . Multiple Vitamin (MULTIVITAMIN WITH MINERALS) TABS tablet Take 1 tablet by mouth daily.    Marland Kitchen olmesartan (BENICAR) 40 MG tablet Take 1 tablet (40 mg total) by mouth daily.    . potassium chloride (K-DUR) 10 MEQ tablet Take 1 tablet (10 mEq total) by mouth daily. 90 tablet 3  . ranitidine (ZANTAC) 150 MG tablet Take 150 mg by mouth as needed.    Marland Kitchen rOPINIRole (REQUIP) 2 MG tablet TAKE 1 TABLET BY MOUTH 4  TIMES DAILY 360 tablet 3  . Tamsulosin HCl (FLOMAX) 0.4 MG CAPS Take 0.4 mg by mouth at bedtime.     . traZODone (DESYREL) 50 MG tablet Take 50 mg by mouth at bedtime.    Marland Kitchen warfarin (COUMADIN) 5 MG tablet TAKE AS DIRECTED BY  COUMADIN CLINIC 150 tablet 1   No  current facility-administered medications on file prior to visit.     Allergies:   Levofloxacin and Sulfa antibiotics   Social History:  The patient  reports that he has quit smoking. He has never used smokeless tobacco. He reports that he drinks about 1.8 oz of alcohol per week . He reports that he does not use drugs.   Family History:   family history includes Diabetes type II in his mother; Heart attack in his mother; Heart disease in his father and mother; Hypertension in his father, mother, and unknown relative; Stroke in his brother, brother, and father.    Review of Systems: Review of Systems  Constitutional: Negative.   Respiratory: Negative.   Cardiovascular: Positive for leg swelling.  Gastrointestinal: Negative.        Abdominal bloating  Musculoskeletal: Negative.   Neurological: Negative.   Psychiatric/Behavioral: Negative.   All other systems reviewed and are negative.    PHYSICAL EXAM: VS:  BP (!) 148/80 (BP Location: Left Arm, Patient Position: Sitting, Cuff Size: Normal)   Pulse (!) 52   Ht 5\' 11"  (1.803 m)   Wt 211 lb (95.7 kg)   BMI 29.43 kg/m  , BMI Body mass index is 29.43  kg/m.  GEN: Well nourished, well developed, in no acute distress , obese, somnolent HEENT: normal  Neck: no JVD, carotid bruits, or masses Cardiac: RRR; no murmurs, rubs, or gallops, trace  pitting edema  Respiratory:  clear to auscultation bilaterally, normal work of breathing GI: soft, nontender, nondistended, + BS MS: no deformity or atrophy  Skin: warm and dry, no rash Neuro:  Strength and sensation are intact Psych: euthymic mood, full affect    Recent Labs: 02/18/2017: TSH 3.090 02/22/2017: ALT 15 08/16/2017: BUN 23; Creatinine, Ser 0.99; Hemoglobin 12.3; Platelets 159; Potassium 4.6; Sodium 131    Lipid Panel No results found for: CHOL, HDL, LDLCALC, TRIG    Wt Readings from Last 3 Encounters:  12/09/16 200 lb (90.7 kg)  11/08/16 183 lb (83 kg) (wrong weight?)  10/15/16 202 lb 4 oz (91.7 kg)   Weight  08/13/2017 is 217 pounds Weight September 26, 2017 is 211 pounds   ASSESSMENT AND PLAN:  Essential hypertension - Plan: EKG 12-Lead labile blood pressure Very low this morning bilateral arm and by history elevated in the evening Very lethargic on today's visit with bradycardia Recommended he decrease metoprolol down to 25 mg daily and take this in the morning of the evening Lasix 40 mg in the morning Hold the Cardura in the morning and take this in the evening Recommended he take his evening pills at 6:00 not 9 PM Daughter present with him today and he will monitor blood pressure morning and evening and call our office with numbers for further medication titration  Coronary artery disease involving native coronary artery of native heart without angina pectoris - Plan: EKG 12-Lead Currently with no symptoms of angina. No further workup at this time. Continue current medication regimen.  Stable  S/P AVR (aortic valve replacement) Well-functioning aortic valve Tolerating anticoagulation  SOB (shortness of breath) Shortness of breath is stable, very deconditioned,  obese Recommended regular walking program Minimal leg edema on today's visit, he will take Lasix 40 mg in the morning  Lower extremity edema diastolic CHF.  Chronic Poor diet, eats at the cafeteria every evening Symptoms exacerbated by hypertension Will work on his blood pressure as above  Long discussion concerning his medications, daughter in the room  Total encounter time more than  45 minutes  Greater than 50% was spent in counseling and coordination of care with the patient  Disposition:   F/U  3 months   Orders Placed This Encounter  Procedures  . EKG 12-Lead     Signed, Dossie Arbour, M.D., Ph.D. 09/26/2017  [facility] [place], [place] [phone]

## 2017-09-26 ENCOUNTER — Encounter: Payer: Self-pay | Admitting: Cardiovascular Disease

## 2017-09-26 ENCOUNTER — Ambulatory Visit (INDEPENDENT_AMBULATORY_CARE_PROVIDER_SITE_OTHER): Payer: Medicare Other | Admitting: Cardiovascular Disease

## 2017-09-26 VITALS — BP 148/80 | HR 52 | Ht 71.0 in | Wt 211.0 lb

## 2017-09-26 DIAGNOSIS — I5032 Chronic diastolic (congestive) heart failure: Secondary | ICD-10-CM | POA: Diagnosis not present

## 2017-09-26 DIAGNOSIS — R0602 Shortness of breath: Secondary | ICD-10-CM | POA: Diagnosis not present

## 2017-09-26 DIAGNOSIS — R600 Localized edema: Secondary | ICD-10-CM

## 2017-09-26 DIAGNOSIS — I25118 Atherosclerotic heart disease of native coronary artery with other forms of angina pectoris: Secondary | ICD-10-CM | POA: Diagnosis not present

## 2017-09-26 DIAGNOSIS — Z7189 Other specified counseling: Secondary | ICD-10-CM | POA: Diagnosis not present

## 2017-09-26 DIAGNOSIS — I350 Nonrheumatic aortic (valve) stenosis: Secondary | ICD-10-CM | POA: Diagnosis not present

## 2017-09-26 DIAGNOSIS — Z952 Presence of prosthetic heart valve: Secondary | ICD-10-CM

## 2017-09-26 NOTE — Patient Instructions (Addendum)
Medication Instructions:  Evening pills at 6 pm after food   Take lasix 2 pills in the AM with Extra at 2 PM for edema/swelling Clonidine Am and PM Doxazosin in the PM only Isosorbide Am and PM Metoprolol 1/2 pill in the AM Olmesartan in the AM  Labwork:  No new labs needed  Testing/Procedures:  No further testing at this time   Follow-Up: It was a pleasure seeing you in the office today. Please call us if you have new issues that need to be addressed before your next appt.  [phone]  Your physician wants you to follow-up in: 3 months.  You will receive a reminder letter in the mail two months in advance. If you don't receive a letter, please call our office to schedule the follow-up appointment.  If you need a refill on your cardiac medications before your next appointment, please call your pharmacy.

## 2017-10-06 ENCOUNTER — Telehealth: Payer: Self-pay | Admitting: Cardiovascular Disease

## 2017-10-06 MED ORDER — FUROSEMIDE 20 MG PO TABS: 40.0000 mg | ORAL_TABLET | Freq: Two times a day (BID) | ORAL | 3 refills | Status: DC | PRN

## 2017-10-06 NOTE — Telephone Encounter (Signed)
°*  STAT* If patient is at the pharmacy, call can be transferred to refill team.   1. Which medications need to be refilled? (please list name of each medication and dose if known) furosemide (LA SIX) 20 MG twice a day  2. Which pharmacy/location (including street and city if local pharmacy) is medication to be sent to? [facility] on [address]  would also like a [facility] day [facility]  3. Do they need a 30 day or 90 day supply? 30 day and 90 day

## 2017-10-08 ENCOUNTER — Ambulatory Visit (INDEPENDENT_AMBULATORY_CARE_PROVIDER_SITE_OTHER): Payer: Medicare Other

## 2017-10-08 DIAGNOSIS — Z7901 Long term (current) use of anticoagulants: Secondary | ICD-10-CM

## 2017-10-08 DIAGNOSIS — I350 Nonrheumatic aortic (valve) stenosis: Secondary | ICD-10-CM | POA: Diagnosis not present

## 2017-10-08 DIAGNOSIS — Z952 Presence of prosthetic heart valve: Secondary | ICD-10-CM | POA: Diagnosis not present

## 2017-10-08 LAB — POCT INR: INR: 3

## 2017-10-09 ENCOUNTER — Telehealth: Payer: Self-pay | Admitting: Cardiovascular Disease

## 2017-10-09 ENCOUNTER — Ambulatory Visit: Payer: Medicare Other | Attending: Specialist

## 2017-10-09 DIAGNOSIS — R0681 Apnea, not elsewhere classified: Secondary | ICD-10-CM | POA: Diagnosis present

## 2017-10-09 DIAGNOSIS — G4733 Obstructive sleep apnea (adult) (pediatric): Secondary | ICD-10-CM | POA: Diagnosis not present

## 2017-10-09 DIAGNOSIS — R0683 Snoring: Secondary | ICD-10-CM | POA: Diagnosis not present

## 2017-10-09 DIAGNOSIS — G2581 Restless legs syndrome: Secondary | ICD-10-CM | POA: Diagnosis not present

## 2017-10-09 NOTE — Telephone Encounter (Signed)
Received list of blood pressures from patient dated 09/29/17 to 10/07/17 with SBP 150-200's over 70-80's diastolic.Patient is taking Clonidine 0.1 mg twice a day, Cardura 8 mg at 6:00 PM, and Metoprolol 50 mg 1/2 tablet (25 mg) in AM. Reviewed these with him and instructed him to take 2 tablets in the AM of Clonidine 0.1 mg and 2 tablets in the PM and to continue monitoring his blood pressure readings and call us back on Tuesday with those numbers. He verbalized understanding with no further questions at this time.

## 2017-10-10 ENCOUNTER — Other Ambulatory Visit: Payer: Self-pay | Admitting: *Deleted

## 2017-10-10 ENCOUNTER — Telehealth: Payer: Self-pay | Admitting: Cardiovascular Disease

## 2017-10-10 MED ORDER — FUROSEMIDE 20 MG PO TABS: 40.0000 mg | ORAL_TABLET | Freq: Two times a day (BID) | ORAL | 1 refills | Status: DC | PRN

## 2017-10-10 NOTE — Telephone Encounter (Signed)
Requested Prescriptions   Signed Prescriptions Disp Refills  . furosemide (LASIX) 20 MG tablet 48 tablet 1    Sig: Take 2 tablets (40 mg total) 2 (two) times daily as needed by mouth.    Authorizing Provider: Antonieta IbaGOLLAN, TIMOTHY J    Ordering User: Kendrick FriesLOPEZ, MARINA C

## 2017-10-10 NOTE — Telephone Encounter (Signed)
Requested Prescriptions   Signed Prescriptions Disp Refills  . furosemide (LASIX) 20 MG tablet 48 tablet 1    Sig: Take 2 tablets (40 mg total) 2 (two) times daily as needed by mouth.    Authorizing Provider: GOLLAN, TIMOTHY J    Ordering User: Amika Tassin C    

## 2017-10-10 NOTE — Telephone Encounter (Signed)
Pt calling stating he's going to receive refill on his Frusemide from mail order  But is asking if we may send about 10-12 days worth [facility] [address]  Please advise.

## 2017-10-11 NOTE — Telephone Encounter (Signed)
He has labile pressures Was low in Am, high in PM on last visit We need Am and PM numbers  In triage note, imdur and benicar was not mentioned Would make sure he is still on these

## 2017-10-13 MED ORDER — CLONIDINE HCL 0.1 MG PO TABS: 0.2000 mg | ORAL_TABLET | Freq: Two times a day (BID) | ORAL | 3 refills | Status: DC

## 2017-10-13 NOTE — Telephone Encounter (Addendum)
Patient confirmed all medications and states that since increasing the clonidine to 2 tablets in AM and 2 tablets in PM his blood pressures have been much better. He reports that they are staying better controlled and that heart rates are good also. He requested refill to be sent into [facility]. Refill sent in and he verbalized understanding to continue monitoring. He verbalized understanding of our conversation and had no further questions at this time.   These readings were from list on 10/09/17. No numbers available since increase in medication. He verbalized understanding to call back if his blood pressure remains elevated.   11/5 182/62 PM 10:45 134/58 HR 52  11/6 193/89 HR 54 6:30AM Took meds at 08:30AM 11/6 153/60 HR 55 2:15   11/7 214/83 HR 56 08:00 AM before meds Took meds 08:30 AM 213/75 HR 56 3:45 PM Took meds at 7:30 pm  190/73 HR 57 at 9:50 PM 178/71 HR 56 Second reading  10/02/17 194/80 HR 55 6:00 AM Took meds at 08:00 AM 184/77 HR 49 10:50AM 159/72 HR 47 second reading Took meds at 6:06 PM 188/72 HR 61 at 6:15 PM 149/64 HR 51 at 11:00 PM    10/03/17 228/83 HR 56 7:00 AM before meds 201/83 HR 57 2nd reading 187/75 10:30AM Took meds at 08:30 AM  10/04/17  201/82 HR 59 at 09:30AM Took meds at 09:30AM 132/56 HR 61 at 1:00 PM 153/60 HR 51 at 09:45 PM  10/05/17 205/77 HR 57 6:30AM 196/77 HR 60 5:30 PM 165/65 HR 52 11:00 PM  Took meds at 6:00 PM 146/66 HR 53 second read  10/06/17 197/72 HR 61 09:30 AM took meds 125/57 HR 60 12:30PM   10/07/17 193/64 HR 52 7:00AM before meds

## 2017-10-14 ENCOUNTER — Encounter: Payer: Self-pay | Admitting: Adult Health

## 2017-10-14 ENCOUNTER — Ambulatory Visit: Payer: Medicare Other | Admitting: Adult Health

## 2017-10-14 ENCOUNTER — Telehealth: Payer: Self-pay | Admitting: Cardiovascular Disease

## 2017-10-14 VITALS — BP 157/70 | HR 43 | Wt 212.0 lb

## 2017-10-14 DIAGNOSIS — G2581 Restless legs syndrome: Secondary | ICD-10-CM | POA: Diagnosis not present

## 2017-10-14 NOTE — Progress Notes (Signed)
PATIENT: [name] DOB: July 13, 1931  REASON FOR VISIT: follow up-restless leg syndrome HISTORY FROM: patient  HISTORY OF PRESENT ILLNESS: Today 10/14/17: [name] is an 83 year old male with a history of restless leg syndrome.  He returns today for follow-up.  He remains on Requip 2 mg taking it 4 times a day.  He reports that this is working well for him.   He states he continues to wake up at 3 AM and takes a dose at that time.  He reports that his symptoms are controlled during the day.  He denies any breakthrough symptoms.  He states that he is still working with his cardiologist to control his blood pressure.  He returns today for an evaluation.  HISTORY 10/14/16: [name] is an 83 year old male with a history of restless leg syndrome. He returns today for follow-up. He is currently on Requip 2 mg 4 times a day. He reports that this works well for his symptoms. He states he is only taken the medication at 9 in the morning, 3 PM, 9 PM and again at 3 AM. He states that he has no trouble going to sleep however he typically wakes up around 3 AM with symptoms. In the past he has had iron deficiency anemia which was corrected. His blood pressure is slightly elevated today. He reports that his community center nurse also has been reporting that his blood pressure has been elevated He plans to call his cardiologist to let them know. He returns today for an evaluation.  REVIEW OF SYSTEMS: Out of a complete 14 system review of symptoms, the patient complains only of the following symptoms, and all other reviewed systems are negative.  See HPI  ALLERGIES: Allergies  Allergen Reactions  . Levofloxacin Nausea Only  . Sulfa Antibiotics Other (See Comments)    Reaction:  Unknown     HOME MEDICATIONS: Outpatient Medications Prior to Visit  Medication Sig Dispense Refill  . acetaminophen (TYLENOL) 500 MG tablet Take 500 mg by mouth every 4 (four) hours as needed for mild pain or  headache.     . ALPRAZolam (XANAX) 0.5 MG tablet Take 0.5 tablets (0.25 mg total) by mouth daily as needed for anxiety. (Patient taking differently: Take 0.5 mg by mouth daily as needed for anxiety. ) 10 tablet 0  . amoxicillin (AMOXIL) 500 MG capsule Take 500 mg by mouth as needed. Dental procedures  1  . celecoxib (CELEBREX) 100 MG capsule Take 100 mg by mouth as needed.    . Cetirizine HCl 10 MG CAPS Take 10 mg by mouth daily.    . citalopram (CELEXA) 20 MG tablet Take 20 mg by mouth daily.    . cloNIDine (CATAPRES) 0.1 MG tablet Take 2 tablets (0.2 mg total) 2 (two) times daily by mouth. 360 tablet 3  . doxazosin (CARDURA) 8 MG tablet TAKE 1 TABLET BY MOUTH  TWICE A DAY 180 tablet 1  . finasteride (PROSCAR) 5 MG tablet Take 5 mg by mouth daily.      . furosemide (LASIX) 20 MG tablet Take 2 tablets (40 mg total) 2 (two) times daily as needed by mouth. 48 tablet 1  . isosorbide mononitrate (IMDUR) 60 MG 24 hr tablet Take 60 mg by mouth 2 (two) times daily.    . meloxicam (MOBIC) 7.5 MG tablet Take 7.5 mg by mouth as needed.    . metoprolol succinate (TOPROL-XL) 50 MG 24 hr tablet TAKE 1 TABLET (50 MG) BY  MOUTH DAILY.  TAKE WITH OR  IMMEDIATELY FOLLOWING A  MEAL. 90 tablet 2  . Multiple Vitamin (MULTIVITAMIN WITH MINERALS) TABS tablet Take 1 tablet by mouth daily.    Marland Kitchen. olmesartan (BENICAR) 40 MG tablet Take 1 tablet (40 mg total) by mouth daily.    . potassium chloride (K-DUR) 10 MEQ tablet Take 1 tablet (10 mEq total) by mouth daily. 90 tablet 3  . ranitidine (ZANTAC) 150 MG tablet Take 150 mg by mouth as needed.    Marland Kitchen. rOPINIRole (REQUIP) 2 MG tablet TAKE 1 TABLET BY MOUTH 4  TIMES DAILY 360 tablet 3  . Tamsulosin HCl (FLOMAX) 0.4 MG CAPS Take 0.4 mg by mouth at bedtime.     . traZODone (DESYREL) 50 MG tablet Take 50 mg by mouth at bedtime.    Marland Kitchen. warfarin (COUMADIN) 5 MG tablet TAKE AS DIRECTED BY  COUMADIN CLINIC 150 tablet 1   No facility-administered medications prior to visit.     PAST  MEDICAL HISTORY: Past Medical History:  Diagnosis Date  . Anxiety 10/11  . Aortic stenosis    a. s/p mechcanical AVR, 2002  . Bradycardia    chronic, no symptoms 07/2010  . C. difficile colitis   . Carotid bruit    dopplers in past, no abnormalities  . Chronic diastolic CHF (congestive heart failure) (HCC)    a. echo 03/2015: EF 60-65%, no RWMA, GR1DD, aortic valve poorly visualized, unable to excluded vegetation, increased transvalvular velocity, mechanical valve is present, mild biatrial enalrgement, mild to mod MR, mod TR, PASP 65 mm Hg  . Coronary artery disease    a. mild, cath, 08/2010; b. medically managed  . Decreased hearing    Right ear  . Depression   . Gastric ulcer   . GERD (gastroesophageal reflux disease)   . Hypertension    BP higher than usual 04/19/10; amlodipine increased by telephone  . RLS (restless legs syndrome) 08/23/2015  . S/P AVR    a. [employer]. mechanical 2002; b. echo 08/2010 EF 60%, trival AI, mild MR, AVR working well; c. on longterm warfarin tx  . SOB (shortness of breath) 10/11   08/2010,Episodes at 5 AM, eventually felt to be anxiety, after complete workup including catheterization, pt greatly improved with anxiety meds 11/11    PAST SURGICAL HISTORY: Past Surgical History:  Procedure Laterality Date  . CARDIAC CATHETERIZATION    . ESOPHAGOGASTRODUODENOSCOPY (EGD) N/A 08/02/2015   Performed by Wallace Cullensh, Paul Y, MD at [facility]  . ESOPHAGOGASTRODUODENOSCOPY (EGD) N/A 04/17/2015   Performed by Scot JunElliott, Robert T, [facility]  . ESOPHAGOGASTRODUODENOSCOPY (EGD) N/A 04/05/2015   Performed by Scot JunElliott, Robert T, MD at [facility] ENDOSCOPY  . HERNIA REPAIR    . JOINT REPLACEMENT    . TOTAL HIP ARTHROPLASTY    . VALVE REPLACEMENT  1/02   Aortic; echo 3/09 valve working well; echo 10/11 working well; put on Coumadin    FAMILY HISTORY: Family History  Problem Relation Age of Onset  . Hypertension Mother   . Diabetes type II Mother   . Heart disease  Mother   . Heart attack Mother   . Hypertension Father   . Heart disease Father   . Stroke Father   . Stroke Brother   . Stroke Brother   . Hypertension Unknown     SOCIAL HISTORY: Social History   Socioeconomic History  . Marital status: Married    Spouse name: Not on file  . Number of children: 3  . Years  of education: some coll.  . Highest education level: Not on file  Social Needs  . Financial resource strain: Not on file  . Food insecurity - worry: Not on file  . Food insecurity - inability: Not on file  . Transportation needs - medical: Not on file  . Transportation needs - non-medical: Not on file  Occupational History  . Occupation: Retired    Associate Professormployer: RETIRED  Tobacco Use  . Smoking status: Former Games developermoker  . Smokeless tobacco: Never Used  Substance and Sexual Activity  . Alcohol use: Yes    Alcohol/week: 1.8 oz    Types: 3 Glasses of wine per week    Comment: Social  . Drug use: No  . Sexual activity: Not on file  Other Topics Concern  . Not on file  Social History Narrative   No regular exercise.   [facility] His wife lives in same facility but she lives in the assisted living part.      Patient drinks 1-2 cups of caffeine daily.   Patient is right handed.      PHYSICAL EXAM  Vitals:   10/14/17 1106 10/14/17 1124  BP: (!) 195/72 (!) 157/70  Pulse: (!) 41 (!) 43  Weight: 212 lb (96.2 kg)    Body mass index is 29.57 kg/m.  Generalized: Well developed, in no acute distress   Neurological examination  Mentation: Alert oriented to time, place, history taking. Follows all commands speech and language fluent Cranial nerve II-XII: Pupils were equal round reactive to light. Extraocular movements were full, visual field were full on confrontational test. Facial sensation and strength were normal. Uvula tongue midline. Head turning and shoulder shrug  were normal and symmetric. Motor: The motor testing reveals 5 over 5 strength of all  4 extremities. Good symmetric motor tone is noted throughout.  Sensory: Sensory testing is intact to soft touch on all 4 extremities. No evidence of extinction is noted.  Coordination: Cerebellar testing reveals good finger-nose-finger and heel-to-shin bilaterally.  Gait and station: Patient uses a Rollator when ambulating Reflexes: Deep tendon reflexes are symmetric and normal bilaterally.   DIAGNOSTIC DATA (LABS, IMAGING, TESTING) - I reviewed patient records, labs, notes, testing and imaging myself where available.  Lab Results  Component Value Date   WBC 5.6 08/16/2017   HGB 12.3 (L) 08/16/2017   HCT 35.7 (L) 08/16/2017   MCV 88.1 08/16/2017   PLT 159 08/16/2017      Component Value Date/Time   NA 131 (L) 08/16/2017 2127   NA 132 (L) 03/20/2017 1402   NA 129 (L) 03/23/2015 0429   K 4.6 08/16/2017 2127   K 3.7 03/23/2015 0429   CL 97 (L) 08/16/2017 2127   CL 96 (L) 03/23/2015 0429   CO2 25 08/16/2017 2127   CO2 28 03/23/2015 0429   GLUCOSE 106 (H) 08/16/2017 2127   GLUCOSE 118 (H) 03/23/2015 0429   BUN 23 (H) 08/16/2017 2127   BUN 21 03/20/2017 1402   BUN 19 03/23/2015 0429   CREATININE 0.99 08/16/2017 2127   CREATININE 0.72 03/23/2015 0429   CALCIUM 8.9 08/16/2017 2127   CALCIUM 7.8 (L) 03/23/2015 0429   PROT 6.9 02/22/2017 1746   PROT 7.2 07/28/2014 1627   ALBUMIN 4.3 02/22/2017 1746   ALBUMIN 4.1 07/28/2014 1627   AST 23 02/22/2017 1746   AST 22 07/28/2014 1627   ALT 15 (L) 02/22/2017 1746   ALT 19 07/28/2014 1627   ALKPHOS 67 02/22/2017 1746  ALKPHOS 98 07/28/2014 1627   BILITOT 0.9 02/22/2017 1746   BILITOT 0.8 07/28/2014 1627   GFRNONAA >60 08/16/2017 2127   GFRNONAA >60 03/23/2015 0429   GFRAA >60 08/16/2017 2127   GFRAA >60 03/23/2015 0429    Lab Results  Component Value Date   HGBA1C 5.3 10/31/2014   No results found for: [id number] Lab Results  Component Value Date   TSH 3.090 02/18/2017      ASSESSMENT AND PLAN 83 y.o. year old male   has a past medical history of Anxiety (10/11), Aortic stenosis, Bradycardia, C. difficile colitis, Carotid bruit, Chronic diastolic CHF (congestive heart failure) (HCC), Coronary artery disease, Decreased hearing, Depression, Gastric ulcer, GERD (gastroesophageal reflux disease), Hypertension, RLS (restless legs syndrome) (08/23/2015), S/P AVR, and SOB (shortness of breath) (10/11). here with:  1.  Restless leg syndrome  Overall the patient is doing well.  He will continue on Requip 2 mg 4 times a day.  He is advised that if his symptoms worsen or he develops new symptoms he should let us know.  He will follow-up in 1 year or sooner if needed.  I spent 15 minutes with the patient. 50% of this time was spent reviewing his medication.     Butch Penny, MSN, NP-C 10/14/2017, 11:05 AM [facility] [address] [place], [place] [address] [phone]

## 2017-10-14 NOTE — Telephone Encounter (Signed)
Spoke with patient and he states that he should be getting it in the mail today. Advised him to give us a call back if he does not get it and I will be happy to send a script into local pharmacy if needed. He verbalized understanding with no further questions at this time.

## 2017-10-14 NOTE — Telephone Encounter (Signed)
[facility] regarding his fluid pill. She states pt has relayed to her he has stopped his fluid medication. [facility] he is having swelling in his belly. Pt states fluid medication is to arrive today. Pt contact # [phone]

## 2017-10-14 NOTE — Progress Notes (Signed)
I have read the note, and I agree with the clinical assessment and plan.  [name] K Doral Ventrella   

## 2017-10-14 NOTE — Patient Instructions (Signed)
Your Plan:  Continue using requip  Continue monitoring Blood pressure If your symptoms worsen or you develop new symptoms please let us know.    [facility] you [facility]. I hope we have been able to provide you high quality care today.  You may receive a patient satisfaction survey over the next few weeks. We would appreciate your feedback and comments so that we may continue to improve ourselves and the health of our patients.

## 2017-10-15 ENCOUNTER — Other Ambulatory Visit: Payer: Self-pay

## 2017-10-15 MED ORDER — FUROSEMIDE 20 MG PO TABS: 40.0000 mg | ORAL_TABLET | Freq: Two times a day (BID) | ORAL | 0 refills | Status: DC | PRN

## 2017-10-15 NOTE — Telephone Encounter (Signed)
Requested Prescriptions   Signed Prescriptions Disp Refills  . furosemide (LASIX) 20 MG tablet 12 tablet 0    Sig: Take 2 tablets (40 mg total) by mouth 2 (two) times daily as needed.    Authorizing Provider: Antonieta IbaGOLLAN, TIMOTHY J    Ordering User: Margrett RudSLAYTON, Brock Larmon N

## 2017-10-15 NOTE — Telephone Encounter (Signed)
°*  STAT* If patient is at the pharmacy, call can be transferred to refill team.   1. Which medications need to be refilled? (please list name of each medication and dose if known)   Furosemide 40 mg po BID PRN   2. Which pharmacy/location (including street and city if local pharmacy) is medication to be sent to?  [facility] [address] [place]   3. Do they need a 30 day or 90 day supply? 12 tablets to last until mail order is received

## 2017-10-15 NOTE — Telephone Encounter (Signed)
Requested Prescriptions   Signed Prescriptions Disp Refills  . furosemide (LASIX) 20 MG tablet 12 tablet 0    Sig: Take 2 tablets (40 mg total) by mouth 2 (two) times daily as needed.    Authorizing Provider: GOLLAN, TIMOTHY J    Ordering User: Anedra Penafiel N    

## 2017-10-21 ENCOUNTER — Other Ambulatory Visit: Payer: Self-pay

## 2017-10-21 ENCOUNTER — Encounter: Payer: Self-pay | Admitting: Family

## 2017-10-21 ENCOUNTER — Ambulatory Visit: Payer: Medicare Other | Attending: Family | Admitting: Family

## 2017-10-21 VITALS — BP 143/60 | Resp 18 | Ht 71.0 in | Wt 209.1 lb

## 2017-10-21 DIAGNOSIS — F419 Anxiety disorder, unspecified: Secondary | ICD-10-CM | POA: Insufficient documentation

## 2017-10-21 DIAGNOSIS — Z7901 Long term (current) use of anticoagulants: Secondary | ICD-10-CM | POA: Insufficient documentation

## 2017-10-21 DIAGNOSIS — G2581 Restless legs syndrome: Secondary | ICD-10-CM | POA: Insufficient documentation

## 2017-10-21 DIAGNOSIS — I2510 Atherosclerotic heart disease of native coronary artery without angina pectoris: Secondary | ICD-10-CM | POA: Diagnosis not present

## 2017-10-21 DIAGNOSIS — Z87891 Personal history of nicotine dependence: Secondary | ICD-10-CM | POA: Diagnosis not present

## 2017-10-21 DIAGNOSIS — A0472 Enterocolitis due to Clostridium difficile, not specified as recurrent: Secondary | ICD-10-CM | POA: Insufficient documentation

## 2017-10-21 DIAGNOSIS — I110 Hypertensive heart disease with heart failure: Secondary | ICD-10-CM | POA: Diagnosis not present

## 2017-10-21 DIAGNOSIS — Z96649 Presence of unspecified artificial hip joint: Secondary | ICD-10-CM | POA: Diagnosis not present

## 2017-10-21 DIAGNOSIS — K219 Gastro-esophageal reflux disease without esophagitis: Secondary | ICD-10-CM | POA: Diagnosis not present

## 2017-10-21 DIAGNOSIS — I10 Essential (primary) hypertension: Secondary | ICD-10-CM

## 2017-10-21 DIAGNOSIS — I890 Lymphedema, not elsewhere classified: Secondary | ICD-10-CM | POA: Diagnosis not present

## 2017-10-21 DIAGNOSIS — Z952 Presence of prosthetic heart valve: Secondary | ICD-10-CM

## 2017-10-21 DIAGNOSIS — G4733 Obstructive sleep apnea (adult) (pediatric): Secondary | ICD-10-CM | POA: Insufficient documentation

## 2017-10-21 DIAGNOSIS — F329 Major depressive disorder, single episode, unspecified: Secondary | ICD-10-CM | POA: Diagnosis not present

## 2017-10-21 DIAGNOSIS — K922 Gastrointestinal hemorrhage, unspecified: Secondary | ICD-10-CM | POA: Diagnosis not present

## 2017-10-21 DIAGNOSIS — I5032 Chronic diastolic (congestive) heart failure: Secondary | ICD-10-CM | POA: Insufficient documentation

## 2017-10-21 DIAGNOSIS — Z79899 Other long term (current) drug therapy: Secondary | ICD-10-CM | POA: Diagnosis not present

## 2017-10-21 DIAGNOSIS — R5383 Other fatigue: Secondary | ICD-10-CM | POA: Diagnosis present

## 2017-10-21 NOTE — Patient Instructions (Signed)
Continue weighing daily and call for an overnight weight gain of > 2 pounds or a weekly weight gain of >5 pounds. 

## 2017-10-21 NOTE — Progress Notes (Signed)
Patient ID: [name], male    DOB: [birth year], 83 y.o.   MRN: [id number]  HPI  [name] is a 83 y/o male with a history of anxiety, c. Diff, CAD, depression, GERD, GI bleed, HTN, restless leg syndrome, prior tobacco use and chronic heart failure.   Echo from 02/21/17 shows an EF of 55-60% along with severe TR and moderated elevated PA pressure of 54 mm Hg.   Was in the ED 08/16/17 due to HTN. Patient was treated and released.   He presents today for a follow-up visit with a chief complaint of moderate fatigue with minimal exertion. He describes this as chronic in nature having been present for several years with varying levels of severity. He has associated shortness of breath, edema, dizziness and difficulty sleeping along with this. He denies any chest pain, palpitations or weight gain.   Past Medical History:  Diagnosis Date  . Anxiety 10/11  . Aortic stenosis    a. s/p mechcanical AVR, 2002  . Bradycardia    chronic, no symptoms 07/2010  . C. difficile colitis   . Carotid bruit    dopplers in past, no abnormalities  . Chronic diastolic CHF (congestive heart failure) (HCC)    a. echo 03/2015: EF 60-65%, no RWMA, GR1DD, aortic valve poorly visualized, unable to excluded vegetation, increased transvalvular velocity, mechanical valve is present, mild biatrial enalrgement, mild to mod MR, mod TR, PASP 65 mm Hg  . Coronary artery disease    a. mild, cath, 08/2010; b. medically managed  . Decreased hearing    Right ear  . Depression   . Gastric ulcer   . GERD (gastroesophageal reflux disease)   . Hypertension    BP higher than usual 04/19/10; amlodipine increased by telephone  . RLS (restless legs syndrome) 08/23/2015  . S/P AVR    a. St. Jude. mechanical 2002; b. echo 08/2010 EF 60%, trival AI, mild MR, AVR working well; c. on longterm warfarin tx  . SOB (shortness of breath) 10/11   08/2010,Episodes at 5 AM, eventually felt to be anxiety, after complete workup including  catheterization, pt greatly improved with anxiety meds 11/11   Past Surgical History:  Procedure Laterality Date  . CARDIAC CATHETERIZATION    . ESOPHAGOGASTRODUODENOSCOPY N/A 04/05/2015   Procedure: ESOPHAGOGASTRODUODENOSCOPY (EGD);  Surgeon: [name], MD;  [facility];  Service: Endoscopy;  Laterality: N/A;  . ESOPHAGOGASTRODUODENOSCOPY N/A 04/17/2015   Procedure: ESOPHAGOGASTRODUODENOSCOPY (EGD);  Surgeon: [name], MD;  [facility];  Service: Endoscopy;  Laterality: N/A;  . ESOPHAGOGASTRODUODENOSCOPY N/A 08/02/2015   Procedure: ESOPHAGOGASTRODUODENOSCOPY (EGD);  Surgeon: [name], MD;  [facility];  Service: Endoscopy;  Laterality: N/A;  . HERNIA REPAIR    . JOINT REPLACEMENT    . TOTAL HIP ARTHROPLASTY    . VALVE REPLACEMENT  1/02   Aortic; echo 3/09 valve working well; echo 10/11 working well; put on Coumadin   Family History  Problem Relation Age of Onset  . Hypertension Mother   . Diabetes type II Mother   . Heart disease Mother   . Heart attack Mother   . Hypertension Father   . Heart disease Father   . Stroke Father   . Stroke Brother   . Stroke Brother   . Hypertension Unknown    Social History   Tobacco Use  . Smoking status: Former Games developermoker  . Smokeless tobacco: Never Used  Substance Use Topics  . Alcohol use: Yes  Alcohol/week: 1.8 oz    Types: 3 Glasses of wine per week    Comment: Social   Allergies  Allergen Reactions  . Levofloxacin Nausea Only  . Sulfa Antibiotics Other (See Comments)    Reaction:  Unknown    Prior to Admission medications   Medication Sig Start Date End Date Taking? Authorizing Provider  acetaminophen (TYLENOL) 500 MG tablet Take 500 mg by mouth every 4 (four) hours as needed for mild pain or headache.    Yes [provider]  ALPRAZolam Prudy Feeler(XANAX) 0.5 MG tablet Take 0.5 tablets (0.25 mg total) by mouth daily as needed for anxiety. Patient taking differently: Take 0.5 mg by  mouth daily as needed for anxiety.  04/20/15  Yes Auburn BilberryPatel, Shreyang, MD  amoxicillin (AMOXIL) 500 MG capsule Take 500 mg by mouth as needed. Dental procedures 05/21/17  Yes [provider]  celecoxib (CELEBREX) 100 MG capsule Take 100 mg by mouth as needed.   Yes [provider]  Cetirizine HCl 10 MG CAPS Take 10 mg by mouth daily.   Yes [provider]  citalopram (CELEXA) 20 MG tablet Take 20 mg by mouth daily.   Yes [provider]  cloNIDine (CATAPRES) 0.1 MG tablet Take 2 tablets (0.2 mg total) 2 (two) times daily by mouth. 10/13/17  Yes Gollan, Tollie Pizzaimothy J, MD  doxazosin (CARDURA) 8 MG tablet TAKE 1 TABLET BY MOUTH  TWICE A DAY 09/23/17  Yes Gollan, Tollie Pizzaimothy J, MD  finasteride (PROSCAR) 5 MG tablet Take 5 mg by mouth daily.     Yes [provider]  furosemide (LASIX) 20 MG tablet Take 2 tablets (40 mg total) by mouth 2 (two) times daily as needed. 10/15/17  Yes Antonieta IbaGollan, Timothy J, MD  isosorbide mononitrate (IMDUR) 60 MG 24 hr tablet Take 60 mg by mouth 2 (two) times daily.   Yes [provider]  meloxicam (MOBIC) 7.5 MG tablet Take 7.5 mg by mouth as needed. 09/10/16  Yes [provider]  metoprolol succinate (TOPROL-XL) 50 MG 24 hr tablet Take 25 mg by mouth daily. Take with or immediately following a meal.   Yes [provider]  Multiple Vitamin (MULTIVITAMIN WITH MINERALS) TABS tablet Take 1 tablet by mouth daily.   Yes [provider]  olmesartan (BENICAR) 40 MG tablet Take 1 tablet (40 mg total) by mouth daily. 08/13/17  Yes Gollan, Tollie Pizzaimothy J, MD  potassium chloride (K-DUR) 10 MEQ tablet Take 1 tablet (10 mEq total) by mouth daily. 09/10/17  Yes Gollan, Tollie Pizzaimothy J, MD  ranitidine (ZANTAC) 150 MG tablet Take 150 mg by mouth as needed.   Yes [provider]  rOPINIRole (REQUIP) 2 MG tablet TAKE 1 TABLET BY MOUTH 4  TIMES DAILY 09/04/17  Yes York SpanielWillis, Charles K, MD  Tamsulosin HCl (FLOMAX) 0.4 MG CAPS Take 0.4 mg  by mouth at bedtime.    Yes [provider]  traZODone (DESYREL) 50 MG tablet Take 50 mg by mouth at bedtime.   Yes [provider]  warfarin (COUMADIN) 5 MG tablet TAKE AS DIRECTED BY  COUMADIN CLINIC 09/23/17  Yes Gollan, Tollie Pizzaimothy J, MD   Review of Systems  Constitutional: Positive for fatigue. Negative for appetite change.  HENT: Positive for hearing loss. Negative for congestion, rhinorrhea and sore throat.   Eyes: Negative.   Respiratory: Positive for shortness of breath. Negative for chest tightness.   Cardiovascular: Positive for leg swelling. Negative for chest pain and palpitations.  Gastrointestinal: Negative for abdominal distention and  abdominal pain.  Endocrine: Negative.   Genitourinary: Negative.   Musculoskeletal: Negative for back pain and neck pain.  Skin: Negative.   Allergic/Immunologic: Negative.   Neurological: Positive for dizziness and light-headedness.  Hematological: Negative for adenopathy. Bruises/bleeds easily.  Psychiatric/Behavioral: Positive for sleep disturbance (interrrupted sleep). Negative for dysphoric mood. The patient is not nervous/anxious.    Vitals:   10/21/17 1253  BP: (!) 143/60  Resp: 18  SpO2: 93%  Weight: 209 lb 2 oz (94.9 kg)  Height: 5\' 11"  (1.803 m)   Wt Readings from Last 3 Encounters:  10/21/17 209 lb 2 oz (94.9 kg)  10/14/17 212 lb (96.2 kg)  09/26/17 211 lb (95.7 kg)    Lab Results  Component Value Date   CREATININE 0.99 08/16/2017   CREATININE 1.06 03/20/2017   CREATININE 0.84 02/22/2017    Physical Exam  Constitutional: He is oriented to person, place, and time. He appears well-developed and well-nourished.  HENT:  Head: Normocephalic and atraumatic.  Right Ear: Decreased hearing is noted.  Left Ear: Decreased hearing is noted.  Neck: Normal range of motion. Neck supple. No JVD present.  Cardiovascular: Regular rhythm. Bradycardia present.  Pulmonary/Chest: Effort normal. No respiratory distress.  He has no wheezes. He has no rales.  Abdominal: Soft. He exhibits no distension. There is no tenderness.  Musculoskeletal: He exhibits edema (2+ pitting edema in bilateral lower legs). He exhibits no tenderness.  Neurological: He is alert and oriented to person, place, and time.  Skin: Skin is warm and dry.  Psychiatric: He has a normal mood and affect. His behavior is normal. Thought content normal.  Nursing note and vitals reviewed.  Assessment & Plan:  1: Chronic heart failure with preserved ejection fraction- - NYHA class III - mildly fluid overloaded with edema although edema is lessening - weighing daily and he was instructed to call for an overnight weight gain of >2 pounds or a weekly weight gain of >5 pounds. Says that his home weight has ranged from 196-198 pounds - not adding salt to his food but does eat often at [employer] and he says that sometimes the food tastes salty.  - patient reports receiving the flu vaccine for this season - saw cardiologist Mariah Milling) 09/26/17  2: HTN- - BP looks good today - saw PCP Letitia Libra) 09/22/17 - BMP from 09/15/17 reviewed and shows sodium 131, potassium 4.5 and GFR 80  3: Obstructive sleep apnea- - sleep study done and CPAP recommended but sleep med is not in patient's network - will fax order to [facility] regarding his CPAP  4: S/P AVR- - replacement with St Jude valve - goes to coumadin clinic for management - INR on 10/08/17 was 3.0  5: Lymphedema- - stage 2 - has a new pair of TED hose but he hasn't started wearing them yet. Instructed to put them on in the morning with removal at bedtime - also encouraged him to elevate his legs more during the day - unable to exercise due to his shortness of breath - could try lymphapress compression boots in the future if edema persists after TED hose/ extremity elevation  Patient did not bring his medications nor a list. Each medication was verbally  reviewed with the patient and he was encouraged to bring the bottles to every visit to confirm accuracy of list.  Return in 4 months or sooner for any questions/problems before then.

## 2017-10-23 DIAGNOSIS — G4733 Obstructive sleep apnea (adult) (pediatric): Secondary | ICD-10-CM | POA: Insufficient documentation

## 2017-10-29 ENCOUNTER — Ambulatory Visit (INDEPENDENT_AMBULATORY_CARE_PROVIDER_SITE_OTHER): Payer: Medicare Other

## 2017-10-29 DIAGNOSIS — I350 Nonrheumatic aortic (valve) stenosis: Secondary | ICD-10-CM | POA: Diagnosis not present

## 2017-10-29 DIAGNOSIS — Z7901 Long term (current) use of anticoagulants: Secondary | ICD-10-CM | POA: Diagnosis not present

## 2017-10-29 DIAGNOSIS — Z952 Presence of prosthetic heart valve: Secondary | ICD-10-CM | POA: Diagnosis not present

## 2017-10-29 LAB — POCT INR: INR: 3.5

## 2017-10-29 NOTE — Patient Instructions (Signed)
Please skip today, then start new dosage of 1 tablet daily except 1.5 tablet on Mondays, Wednesdays and Fridays.   Recheck in 2 weeks.

## 2017-11-12 ENCOUNTER — Ambulatory Visit (INDEPENDENT_AMBULATORY_CARE_PROVIDER_SITE_OTHER): Payer: Medicare Other

## 2017-11-12 DIAGNOSIS — Z7901 Long term (current) use of anticoagulants: Secondary | ICD-10-CM

## 2017-11-12 DIAGNOSIS — Z952 Presence of prosthetic heart valve: Secondary | ICD-10-CM

## 2017-11-12 DIAGNOSIS — I350 Nonrheumatic aortic (valve) stenosis: Secondary | ICD-10-CM | POA: Diagnosis not present

## 2017-11-12 LAB — POCT INR: INR: 3.1

## 2017-11-12 NOTE — Patient Instructions (Signed)
Please take 5 mg today, then resume dosage of 1 tablet daily except 1.5 tablet on Mondays, Wednesdays and Fridays.   Recheck in 3 weeks.

## 2017-11-13 ENCOUNTER — Other Ambulatory Visit: Payer: Self-pay

## 2017-11-13 MED ORDER — METOPROLOL SUCCINATE ER 50 MG PO TB24: 25.0000 mg | ORAL_TABLET | Freq: Every day | ORAL | 0 refills | Status: DC

## 2017-11-13 MED ORDER — METOPROLOL SUCCINATE ER 25 MG PO TB24: 25.0000 mg | ORAL_TABLET | Freq: Every day | ORAL | 0 refills | Status: DC

## 2017-12-03 ENCOUNTER — Ambulatory Visit (INDEPENDENT_AMBULATORY_CARE_PROVIDER_SITE_OTHER): Payer: Medicare Other

## 2017-12-03 DIAGNOSIS — Z7901 Long term (current) use of anticoagulants: Secondary | ICD-10-CM

## 2017-12-03 DIAGNOSIS — I350 Nonrheumatic aortic (valve) stenosis: Secondary | ICD-10-CM

## 2017-12-03 DIAGNOSIS — Z952 Presence of prosthetic heart valve: Secondary | ICD-10-CM | POA: Diagnosis not present

## 2017-12-03 LAB — POCT INR: INR: 2.9

## 2017-12-03 NOTE — Patient Instructions (Signed)
Please continue dosage of 1 tablet daily except 1.5 tablet on Mondays, Wednesdays and Fridays.   Recheck in 4 weeks.

## 2017-12-27 NOTE — Progress Notes (Signed)
Cardiology Office Note  Date:  12/29/2017   ID:  [name], DOB October 08, 1931, MRN [id number]  PCP:  Gracelyn NurseJohnston, [name] D, MD   Chief Complaint  Patient presents with  . other    3 month f/u no complaints. Meds reviewed verbally with pt.    HPI:  84 year old male with  h/o aortic stenosis  s/p St. Jude mechanical aortic valve replacement in 2002 on chronic Coumadin therapy,  mild CAD by cardiac cath in 2011 managed medically,  GERD,  HTN,  carotid bruits,  Chronic hyponatremia asymptomatic bradycardia,  RLS, anxiety, depression,  GI bleed OSA on CPAP osteoarthritis s/p total left hip replacement on 03/21/2015 who [facility] with a hgb of 8 (baseline 12), he was started on po iron replacement therapy, who presented to on 04/02/15 with acute onset of weakness, melena, BRBPR, and was found to have a hgb of 5.1. Aspirin and warfarin initially held, restarted at a later date, S/p EGD Ejection fraction greater than 55% March 2018, moderately elevated right heart pressures He presents today for follow-up of his aortic valve replacement And hypertension  In follow-up today he presents with his daughter Some regular exercise, water aerobics, sparingly Can walk in his hallway outside his apartment  Sits hours at a time, Crosswords all day Sits with his feet down Right greater than left lower extremity swelling, is not bothered by it  No recent trips to the emergency room Reports blood pressure is relatively well controlled  Bradycardia noted on EKG, feels he is asymptomatic   Tolerating his CPAP Better sleep, less daytime somnolence   Lab work reviewed with him in detail Total chol 153, LDL 91 Sodium 130 CR 1.0  EKG personally reviewed by myself on todays visit shows sinus bradycardia rate 49 bpm no significant ST or T wave changes  Other past medical history reviewed  was in the ED 08/16/17 due to HTN. Patient was treated and released.   Blood pressure was 215/100  Recently seen in the emergency room Twice for hypertension On a prior office visit I recommended he increase isosorbide to 30 mg twice a day Also was recommended to double his Diovan dose He was given 1 dose clonidine in the emergency room On a follow-up visit with ryan Dunn it was recommended he increase isosorbide to 30 mg 3 times a day, increase metoprolol up to 50 mg daily He showed up and began in the emergency room several days later  Previous Echocardiogram shows normal LV function, well-functioning aortic valve, Moderately elevated right heart pressures with severe TR  Not on hydralazine secondary to side effects Clonidine was fatigue dry mouth Prior notes indicating amlodipine 10 mg caused leg swelling,  Though he reports stopping the amlodipine did not change his swelling  Other past medical history reviewed 48 hour Holter monitor 12/2015 Normal sinus rhythm Average heart beat 70 bpm, maximum heart rate 114 bpm, minimum 45 bpm (at 3 AM) Frequent APCs, couplets, bigeminy's with short runs, longest run of 5 beats (12% of all beats),    leg edema is better by holding amlodipine 10 mg Previous drop in Hemoglobin 9 improved up to 14  Previous Echocardiogram for Feb 22nd 2017 reviewed with him showing well-functioning aortic valve  Previous cardiac catheterization report from 2011 reviewed with him showing 20% proximal LAD, 40% mid LAD disease  PMH:   has a past medical history of Anxiety (10/11), Aortic stenosis, Bradycardia, C. difficile colitis, Carotid bruit,  Chronic diastolic CHF (congestive heart failure) (HCC), Coronary artery disease, Decreased hearing, Depression, Gastric ulcer, GERD (gastroesophageal reflux disease), Hypertension, RLS (restless legs syndrome) (08/23/2015), S/P AVR, and SOB (shortness of breath) (10/11).  PSH:    Past Surgical History:  Procedure Laterality Date  . CARDIAC CATHETERIZATION    .  ESOPHAGOGASTRODUODENOSCOPY N/A 04/05/2015   Procedure: ESOPHAGOGASTRODUODENOSCOPY (EGD);  Surgeon: Scot Jun, MD;  Location: [facility] ENDOSCOPY;  Service: Endoscopy;  Laterality: N/A;  . ESOPHAGOGASTRODUODENOSCOPY N/A 04/17/2015   Procedure: ESOPHAGOGASTRODUODENOSCOPY (EGD);  Surgeon: Scot Jun, MD;  Location: [facility] ENDOSCOPY;  Service: Endoscopy;  Laterality: N/A;  . ESOPHAGOGASTRODUODENOSCOPY N/A 08/02/2015   Procedure: ESOPHAGOGASTRODUODENOSCOPY (EGD);  Surgeon: Wallace Cullens, MD;  Location: [facility] ENDOSCOPY;  Service: Endoscopy;  Laterality: N/A;  . HERNIA REPAIR    . JOINT REPLACEMENT    . TOTAL HIP ARTHROPLASTY    . VALVE REPLACEMENT  1/02   Aortic; echo 3/09 valve working well; echo 10/11 working well; put on Coumadin    Current Outpatient Medications on File Prior to Visit  Medication Sig Dispense Refill  . acetaminophen (TYLENOL) 500 MG tablet Take 500 mg by mouth every 4 (four) hours as needed for mild pain or headache.     . ALPRAZolam (XANAX) 0.5 MG tablet Take 0.5 tablets (0.25 mg total) by mouth daily as needed for anxiety. (Patient taking differently: Take 0.5 mg by mouth daily as needed for anxiety. ) 10 tablet 0  . amoxicillin (AMOXIL) 500 MG capsule Take 500 mg by mouth as needed. Dental procedures  1  . celecoxib (CELEBREX) 100 MG capsule Take 100 mg by mouth as needed.    . Cetirizine HCl 10 MG CAPS Take 10 mg by mouth daily.    . citalopram (CELEXA) 20 MG tablet Take 20 mg by mouth daily.    . cloNIDine (CATAPRES) 0.1 MG tablet Take 2 tablets (0.2 mg total) 2 (two) times daily by mouth. 360 tablet 3  . doxazosin (CARDURA) 8 MG tablet TAKE 1 TABLET BY MOUTH  TWICE A DAY (Patient taking differently: TAKE 1 TABLET BY MOUTH  QD.) 180 tablet 1  . finasteride (PROSCAR) 5 MG tablet Take 5 mg by mouth daily.      . furosemide (LASIX) 20 MG tablet Take 2 tablets (40 mg total) by mouth 2 (two) times daily as needed. 12 tablet 0  . isosorbide mononitrate (IMDUR) 60 MG 24 hr tablet  Take 60 mg by mouth 2 (two) times daily.    . meloxicam (MOBIC) 7.5 MG tablet Take 7.5 mg by mouth as needed.    . metoprolol succinate (TOPROL-XL) 25 MG 24 hr tablet Take 1 tablet (25 mg total) by mouth daily. Take with or immediately following a meal. 90 tablet 0  . Multiple Vitamin (MULTIVITAMIN WITH MINERALS) TABS tablet Take 1 tablet by mouth daily.    Marland Kitchen olmesartan (BENICAR) 40 MG tablet Take 1 tablet (40 mg total) by mouth daily.    . potassium chloride (K-DUR) 10 MEQ tablet Take 1 tablet (10 mEq total) by mouth daily. 90 tablet 3  . ranitidine (ZANTAC) 150 MG tablet Take 150 mg by mouth as needed.    Marland Kitchen rOPINIRole (REQUIP) 2 MG tablet TAKE 1 TABLET BY MOUTH 4  TIMES DAILY 360 tablet 3  . Tamsulosin HCl (FLOMAX) 0.4 MG CAPS Take 0.4 mg by mouth at bedtime.     . traZODone (DESYREL) 50 MG tablet Take 50 mg by mouth at bedtime.    Marland Kitchen warfarin (COUMADIN)  5 MG tablet TAKE AS DIRECTED BY  COUMADIN CLINIC 150 tablet 1   No current facility-administered medications on file prior to visit.     Allergies:   Levofloxacin and Sulfa antibiotics   Social History:  The patient  reports that he has quit smoking. he has never used smokeless tobacco. He reports that he drinks about 1.8 oz of alcohol per week. He reports that he does not use drugs.   Family History:   family history includes Diabetes type II in his mother; Heart attack in his mother; Heart disease in his father and mother; Hypertension in his father, mother, and unknown relative; Stroke in his brother, brother, and father.    Review of Systems: Review of Systems  Constitutional: Negative.   Respiratory: Negative.   Cardiovascular: Positive for leg swelling.  Gastrointestinal: Negative.        Abdominal bloating  Musculoskeletal: Negative.   Neurological: Negative.   Psychiatric/Behavioral: Negative.   All other systems reviewed and are negative.    PHYSICAL EXAM: VS:  BP 140/62 (BP Location: Left Arm, Patient Position:  Sitting, Cuff Size: Normal)   Pulse (!) 49   Ht 5\' 11"  (1.803 m)   Wt 209 lb (94.8 kg)   BMI 29.15 kg/m  , BMI Body mass index is 29.15 kg/m.  GEN: Well nourished, well developed, in no acute distress , obese, somnolent HEENT: normal  Neck: no JVD, carotid bruits, or masses Cardiac: RRR; no murmurs, rubs, or gallops, trace  pitting edema  Respiratory:  clear to auscultation bilaterally, normal work of breathing GI: soft, nontender, nondistended, + BS MS: no deformity or atrophy  Skin: warm and dry, no rash Neuro:  Strength and sensation are intact Psych: euthymic mood, full affect    Recent Labs: 02/18/2017: TSH 3.090 02/22/2017: ALT 15 08/16/2017: BUN 23; Creatinine, Ser 0.99; Hemoglobin 12.3; Platelets 159; Potassium 4.6; Sodium 131    Lipid Panel No results found for: CHOL, HDL, LDLCALC, TRIG    Wt Readings from Last 3 Encounters:  12/09/16 200 lb (90.7 kg)  11/08/16 183 lb (83 kg) (wrong weight?)  10/15/16 202 lb 4 oz (91.7 kg)   Weight  08/13/2017 is 217 pounds Weight September 26, 2017 is 211 pounds   ASSESSMENT AND PLAN:  Essential hypertension - Plan: EKG 12-Lead Previously with labile blood pressure Blood pressure is well controlled on today's visit.  For bradycardia recommended he decrease metoprolol succinate down to 12.5 mg daily  Coronary artery disease involving native coronary artery of native heart without angina pectoris - Plan: EKG 12-Lead Currently with no symptoms of angina.Continue current medication regimen.  Stable, no further testing ordered  S/P AVR (aortic valve replacement) Well-functioning aortic valve Tolerating anticoagulation Consider repeat echocardiogram next year  SOB (shortness of breath) Taking Lasix daily, denies having significant shortness of breath Minimal leg swelling right greater than left Repeat echocardiogram next year  Lower extremity edema diastolic CHF.  Elevated right heart pressures on previous  echocardiogram Symptoms improved, stable  Long discussion concerning his medications, daughter in the room  Total encounter time more than 25 minutes  Greater than 50% was spent in counseling and coordination of care with the patient  Disposition:   F/U  12 months   Orders Placed This Encounter  Procedures  . EKG 12-Lead     Signed, Dossie Arbour, M.[name]., Ph.[name]. 12/29/2017  [facility] [place], [place] [phone]

## 2017-12-29 ENCOUNTER — Encounter: Payer: Self-pay | Admitting: Cardiovascular Disease

## 2017-12-29 ENCOUNTER — Ambulatory Visit (INDEPENDENT_AMBULATORY_CARE_PROVIDER_SITE_OTHER): Payer: Medicare Other

## 2017-12-29 ENCOUNTER — Ambulatory Visit: Payer: Medicare Other | Admitting: Cardiovascular Disease

## 2017-12-29 VITALS — BP 140/62 | HR 49 | Ht 71.0 in | Wt 209.0 lb

## 2017-12-29 DIAGNOSIS — I25118 Atherosclerotic heart disease of native coronary artery with other forms of angina pectoris: Secondary | ICD-10-CM | POA: Diagnosis not present

## 2017-12-29 DIAGNOSIS — I350 Nonrheumatic aortic (valve) stenosis: Secondary | ICD-10-CM

## 2017-12-29 DIAGNOSIS — Z952 Presence of prosthetic heart valve: Secondary | ICD-10-CM

## 2017-12-29 DIAGNOSIS — I10 Essential (primary) hypertension: Secondary | ICD-10-CM

## 2017-12-29 DIAGNOSIS — I5032 Chronic diastolic (congestive) heart failure: Secondary | ICD-10-CM | POA: Diagnosis not present

## 2017-12-29 DIAGNOSIS — I890 Lymphedema, not elsewhere classified: Secondary | ICD-10-CM | POA: Diagnosis not present

## 2017-12-29 DIAGNOSIS — G4733 Obstructive sleep apnea (adult) (pediatric): Secondary | ICD-10-CM | POA: Diagnosis not present

## 2017-12-29 DIAGNOSIS — Z7901 Long term (current) use of anticoagulants: Secondary | ICD-10-CM

## 2017-12-29 LAB — POCT INR: INR: 3.9

## 2017-12-29 MED ORDER — METOPROLOL SUCCINATE ER 25 MG PO TB24: 12.5000 mg | ORAL_TABLET | Freq: Every day | ORAL | 3 refills | Status: DC

## 2017-12-29 NOTE — Patient Instructions (Addendum)
Medication Instructions:   Please cut the metoprolol in 1/2 daily  Labwork:  No new labs needed  Testing/Procedures:  Echocardiogram next year for aortic valve replacement, pulmonary hypertension   Follow-Up: It was a pleasure seeing you in the office today. Please call us if you have new issues that need to be addressed before your next appt.  [phone]  Your physician wants you to follow-up in: 12 months after echocardiogram.  You will receive a reminder letter in the mail two months in advance. If you don't receive a letter, please call our office to schedule the follow-up appointment.  If you need a refill on your cardiac medications before your next appointment, please call your pharmacy.

## 2017-12-29 NOTE — Patient Instructions (Addendum)
Please skip today's dosage, then continue dosage of 1 tablet daily except 1.5 tablet on Mondays, Wednesdays and Fridays.   Recheck in 2 weeks.

## 2018-01-05 ENCOUNTER — Telehealth: Payer: Self-pay | Admitting: Cardiovascular Disease

## 2018-01-05 ENCOUNTER — Other Ambulatory Visit: Payer: Self-pay

## 2018-01-05 NOTE — Telephone Encounter (Signed)
S/w patient. He thinks one of his medications is causing issues with his eyes. He says the issue is it is difficulty focusing and words when he is reading. He does not have trouble focusing on other objects, most its when reading. He remembers reading that vision issues was a side effect of one of his medicines but does not remember which one it was. Advised patient to call or go by his pharmacy to speak with the pharmacist about side effects. If it is one of his cardiac medications, then to call us back and we will go from there. He verbalized understanding.

## 2018-01-05 NOTE — Telephone Encounter (Signed)
Pt calling stating he's been having some vision issues States it has been going on for about a few months  He is not sure which medication it could be but is concerned for this is getting worst   Please advise

## 2018-01-09 ENCOUNTER — Ambulatory Visit: Payer: Medicare Other | Attending: Family | Admitting: Family

## 2018-01-09 ENCOUNTER — Encounter: Payer: Self-pay | Admitting: Family

## 2018-01-09 VITALS — BP 151/64 | HR 53 | Resp 18 | Ht 71.0 in | Wt 208.5 lb

## 2018-01-09 DIAGNOSIS — F419 Anxiety disorder, unspecified: Secondary | ICD-10-CM | POA: Insufficient documentation

## 2018-01-09 DIAGNOSIS — Z952 Presence of prosthetic heart valve: Secondary | ICD-10-CM | POA: Diagnosis not present

## 2018-01-09 DIAGNOSIS — Z882 Allergy status to sulfonamides status: Secondary | ICD-10-CM | POA: Diagnosis not present

## 2018-01-09 DIAGNOSIS — I2510 Atherosclerotic heart disease of native coronary artery without angina pectoris: Secondary | ICD-10-CM | POA: Diagnosis not present

## 2018-01-09 DIAGNOSIS — Z87891 Personal history of nicotine dependence: Secondary | ICD-10-CM | POA: Diagnosis not present

## 2018-01-09 DIAGNOSIS — F329 Major depressive disorder, single episode, unspecified: Secondary | ICD-10-CM | POA: Diagnosis not present

## 2018-01-09 DIAGNOSIS — I110 Hypertensive heart disease with heart failure: Secondary | ICD-10-CM | POA: Insufficient documentation

## 2018-01-09 DIAGNOSIS — I10 Essential (primary) hypertension: Secondary | ICD-10-CM

## 2018-01-09 DIAGNOSIS — G4733 Obstructive sleep apnea (adult) (pediatric): Secondary | ICD-10-CM

## 2018-01-09 DIAGNOSIS — K219 Gastro-esophageal reflux disease without esophagitis: Secondary | ICD-10-CM | POA: Diagnosis not present

## 2018-01-09 DIAGNOSIS — H9190 Unspecified hearing loss, unspecified ear: Secondary | ICD-10-CM | POA: Diagnosis not present

## 2018-01-09 DIAGNOSIS — I5032 Chronic diastolic (congestive) heart failure: Secondary | ICD-10-CM

## 2018-01-09 DIAGNOSIS — I350 Nonrheumatic aortic (valve) stenosis: Secondary | ICD-10-CM | POA: Insufficient documentation

## 2018-01-09 DIAGNOSIS — Z7901 Long term (current) use of anticoagulants: Secondary | ICD-10-CM | POA: Diagnosis not present

## 2018-01-09 DIAGNOSIS — Z79899 Other long term (current) drug therapy: Secondary | ICD-10-CM | POA: Diagnosis not present

## 2018-01-09 NOTE — Patient Instructions (Signed)
Continue weighing daily and call for an overnight weight gain of > 2 pounds or a weekly weight gain of >5 pounds. 

## 2018-01-09 NOTE — Progress Notes (Signed)
Patient ID: [name], male    DOB: 19-Mar-1931, 84 y.o.   MRN: [id number]  HPI  [name] is a 84 y/o male with a history of anxiety, c. Diff, CAD, depression, GERD, GI bleed, HTN, restless leg syndrome, prior tobacco use and chronic heart failure.   Echo from 02/21/17 shows an EF of 55-60% along with severe TR and moderated elevated PA pressure of 54 mm Hg.   Was in the ED 08/16/17 due to HTN. Patient was treated and released.   He presents today for a follow-up visit with a chief complaint of moderate fatigue with minimal exertion. He describes this as chronic in nature having been present for several years with varying levels of severity. He has associated shortness of breath, edema and light-headedness along with this. He denies any chest pain, abdominal distention, palpitations, weight gain or difficulty sleeping.   Past Medical History:  Diagnosis Date  . Anxiety 10/11  . Aortic stenosis    a. s/p mechcanical AVR, 2002  . Bradycardia    chronic, no symptoms 07/2010  . C. difficile colitis   . Carotid bruit    dopplers in past, no abnormalities  . Chronic diastolic CHF (congestive heart failure) (HCC)    a. echo 03/2015: EF 60-65%, no RWMA, GR1DD, aortic valve poorly visualized, unable to excluded vegetation, increased transvalvular velocity, mechanical valve is present, mild biatrial enalrgement, mild to mod [name], mod TR, PASP 65 mm Hg  . Coronary artery disease    a. mild, cath, 08/2010; b. medically managed  . Decreased hearing    Right ear  . Depression   . Gastric ulcer   . GERD (gastroesophageal reflux disease)   . Hypertension    BP higher than usual 04/19/10; amlodipine increased by telephone  . RLS (restless legs syndrome) 08/23/2015  . S/P AVR    a. St. Jude. mechanical 2002; b. echo 08/2010 EF 60%, trival AI, mild [name], AVR working well; c. on longterm warfarin tx  . SOB (shortness of breath) 10/11   08/2010,Episodes at 5 AM, eventually felt to be anxiety,  after complete workup including catheterization, pt greatly improved with anxiety meds 11/11   Past Surgical History:  Procedure Laterality Date  . CARDIAC CATHETERIZATION    . ESOPHAGOGASTRODUODENOSCOPY N/A 04/05/2015   Procedure: ESOPHAGOGASTRODUODENOSCOPY (EGD);  Surgeon: [name], MD;  [facility];  Service: Endoscopy;  Laterality: N/A;  . ESOPHAGOGASTRODUODENOSCOPY N/A 04/17/2015   Procedure: ESOPHAGOGASTRODUODENOSCOPY (EGD);  Surgeon: [name], MD;  [facility];  Service: Endoscopy;  Laterality: N/A;  . ESOPHAGOGASTRODUODENOSCOPY N/A 08/02/2015   Procedure: ESOPHAGOGASTRODUODENOSCOPY (EGD);  Surgeon: [name], MD;  [facility];  Service: Endoscopy;  Laterality: N/A;  . HERNIA REPAIR    . JOINT REPLACEMENT    . TOTAL HIP ARTHROPLASTY    . VALVE REPLACEMENT  1/02   Aortic; echo 3/09 valve working well; echo 10/11 working well; put on Coumadin   Family History  Problem Relation Age of Onset  . Hypertension Mother   . Diabetes type II Mother   . Heart disease Mother   . Heart attack Mother   . Hypertension Father   . Heart disease Father   . Stroke Father   . Stroke Brother   . Stroke Brother   . Hypertension Unknown    Social History   Tobacco Use  . Smoking status: Former Games developermoker  . Smokeless tobacco: Never Used  Substance Use Topics  . Alcohol use: Yes  Alcohol/week: 1.8 oz    Types: 3 Glasses of wine per week    Comment: Social   Allergies  Allergen Reactions  . Levofloxacin Nausea Only  . Sulfa Antibiotics Other (See Comments)    Reaction:  Unknown    Prior to Admission medications   Medication Sig Start Date End Date Taking? Authorizing Provider  ALPRAZolam Prudy Feeler) 0.5 MG tablet Take 0.5 tablets (0.25 mg total) by mouth daily as needed for anxiety. Patient taking differently: Take 0.5 mg by mouth daily as needed for anxiety.  04/20/15  Yes Auburn Bilberry, MD  amoxicillin (AMOXIL) 500 MG capsule Take 500 mg  by mouth as needed. Dental procedures 05/21/17  Yes [provider]  celecoxib (CELEBREX) 100 MG capsule Take 100 mg by mouth as needed.   Yes [provider]  Cetirizine HCl 10 MG CAPS Take 10 mg by mouth daily.   Yes [provider]  citalopram (CELEXA) 20 MG tablet Take 20 mg by mouth daily.   Yes [provider]  cloNIDine (CATAPRES) 0.1 MG tablet Take 2 tablets (0.2 mg total) 2 (two) times daily by mouth. 10/13/17  Yes Gollan, Tollie Pizza, MD  doxazosin (CARDURA) 8 MG tablet TAKE 1 TABLET BY MOUTH  TWICE A DAY Patient taking differently: TAKE 1 TABLET BY MOUTH  QD. 09/23/17  Yes Gollan, Tollie Pizza, MD  finasteride (PROSCAR) 5 MG tablet Take 5 mg by mouth daily.     Yes [provider]  furosemide (LASIX) 20 MG tablet Take 2 tablets (40 mg total) by mouth 2 (two) times daily as needed. 10/15/17  Yes Antonieta Iba, MD  isosorbide mononitrate (IMDUR) 60 MG 24 hr tablet Take 60 mg by mouth 2 (two) times daily.   Yes [provider]  meloxicam (MOBIC) 7.5 MG tablet Take 7.5 mg by mouth as needed. 09/10/16  Yes [provider]  metoprolol succinate (TOPROL-XL) 25 MG 24 hr tablet Take 0.5 tablets (12.5 mg total) by mouth daily. Patient taking differently: Take by mouth daily.  12/29/17  Yes Antonieta Iba, MD  Multiple Vitamin (MULTIVITAMIN WITH MINERALS) TABS tablet Take 1 tablet by mouth daily.   Yes [provider]  olmesartan (BENICAR) 40 MG tablet Take 1 tablet (40 mg total) by mouth daily. 08/13/17  Yes Gollan, Tollie Pizza, MD  potassium chloride (K-DUR) 10 MEQ tablet Take 1 tablet (10 mEq total) by mouth daily. 09/10/17  Yes Gollan, Tollie Pizza, MD  ranitidine (ZANTAC) 150 MG tablet Take 150 mg by mouth as needed.   Yes [provider]  rOPINIRole (REQUIP) 2 MG tablet TAKE 1 TABLET BY MOUTH 4  TIMES DAILY 09/04/17  Yes York Spaniel, MD  Tamsulosin HCl (FLOMAX) 0.4 MG CAPS Take 0.4 mg by mouth at bedtime.    Yes  [provider]  traZODone (DESYREL) 50 MG tablet Take 50 mg by mouth at bedtime.   Yes [provider]  warfarin (COUMADIN) 5 MG tablet TAKE AS DIRECTED BY  COUMADIN CLINIC 09/23/17  Yes Gollan, Tollie Pizza, MD  acetaminophen (TYLENOL) 500 MG tablet Take 500 mg by mouth every 4 (four) hours as needed for mild pain or headache.     [provider]    Review of Systems  Constitutional: Positive for fatigue. Negative for appetite change.  HENT: Positive for hearing loss. Negative for congestion, rhinorrhea and sore throat.   Eyes: Negative.   Respiratory: Positive for shortness of breath. Negative for chest tightness.   Cardiovascular: Positive  for leg swelling. Negative for chest pain and palpitations.  Gastrointestinal: Negative for abdominal distention and abdominal pain.  Endocrine: Negative.   Genitourinary: Negative.   Musculoskeletal: Negative for back pain and neck pain.  Skin: Negative.   Allergic/Immunologic: Negative.   Neurological: Positive for light-headedness. Negative for dizziness.  Hematological: Negative for adenopathy. Bruises/bleeds easily.  Psychiatric/Behavioral: Negative for dysphoric mood and sleep disturbance (wearing CPAP nightly). The patient is not nervous/anxious.    Vitals:   01/09/18 1134  BP: (!) 151/64  Pulse: (!) 53  Resp: 18  SpO2: 99%  Weight: 208 lb 8 oz (94.6 kg)  Height: 5\' 11"  (1.803 m)   Wt Readings from Last 3 Encounters:  01/09/18 208 lb 8 oz (94.6 kg)  12/29/17 209 lb (94.8 kg)  10/21/17 209 lb 2 oz (94.9 kg)   Lab Results  Component Value Date   CREATININE 0.99 08/16/2017   CREATININE 1.06 03/20/2017   CREATININE 0.84 02/22/2017   Physical Exam  Constitutional: He is oriented to person, place, and time. He appears well-developed and well-nourished.  HENT:  Head: Normocephalic and atraumatic.  Right Ear: Decreased hearing is noted.  Left Ear: Decreased hearing is noted.  Neck: Normal range of motion.  Neck supple. No JVD present.  Cardiovascular: Regular rhythm. Bradycardia present.  Pulmonary/Chest: Effort normal. No respiratory distress. He has no wheezes. He has no rales.  Abdominal: Soft. He exhibits no distension. There is no tenderness.  Musculoskeletal: He exhibits edema (1+ pitting edema in bilateral lower legs). He exhibits no tenderness.  Neurological: He is alert and oriented to person, place, and time.  Skin: Skin is warm and dry.  Psychiatric: He has a normal mood and affect. His behavior is normal. Thought content normal.  Nursing note and vitals reviewed.  Assessment & Plan:  1: Chronic heart failure with preserved ejection fraction- - NYHA class III - mildly fluid overloaded with edema although edema is lessening - weighing daily and he was instructed to call for an overnight weight gain of >2 pounds or a weekly weight gain of >5 pounds.  - weight stable by our scale - not adding salt to his food but does eat often at [employer] and he says that sometimes the food tastes salty.  - patient reports receiving the flu vaccine for this season - saw cardiologist Mariah Milling) 12/29/17  2: HTN- - BP mildly elevated today - saw PCP Letitia Libra) 12/24/17 - BMP from 12/17/17 reviewed and shows sodium 130, potassium 4.9 and GFR 71  3: Obstructive sleep apnea- - patient wearing CPAP nightly for ~6-7 hours each night - he says that he sleeps much better since wearing it - he also wakes up feeling rested   Patient did not bring his medications nor a list. Each medication was verbally reviewed with the patient and he was encouraged to bring the bottles to every visit to confirm accuracy of list.  Patient opts to not make a return appointment at this time. Advised patient that he could call back at any time to schedule another appointment.

## 2018-01-12 ENCOUNTER — Ambulatory Visit (INDEPENDENT_AMBULATORY_CARE_PROVIDER_SITE_OTHER): Payer: Medicare Other

## 2018-01-12 DIAGNOSIS — Z952 Presence of prosthetic heart valve: Secondary | ICD-10-CM

## 2018-01-12 DIAGNOSIS — I350 Nonrheumatic aortic (valve) stenosis: Secondary | ICD-10-CM

## 2018-01-12 DIAGNOSIS — Z5181 Encounter for therapeutic drug level monitoring: Secondary | ICD-10-CM

## 2018-01-12 DIAGNOSIS — Z7901 Long term (current) use of anticoagulants: Secondary | ICD-10-CM | POA: Diagnosis not present

## 2018-01-12 LAB — POCT INR: INR: 3

## 2018-01-12 NOTE — Patient Instructions (Signed)
Please continue dosage of 1 tablet daily except 1.5 tablet on Mondays, Wednesdays and Fridays.   Recheck in 3 weeks.

## 2018-01-23 ENCOUNTER — Other Ambulatory Visit: Payer: Self-pay

## 2018-01-30 ENCOUNTER — Telehealth: Payer: Self-pay | Admitting: Cardiovascular Disease

## 2018-01-30 NOTE — Telephone Encounter (Signed)
Pt needs doseage clarification on Metoprolol. Please call.

## 2018-01-30 NOTE — Telephone Encounter (Signed)
I attempted to call the patient. I left a message on his identified voice mail (ok per DPR), that per Dr. Windell HummingbirdGollan's last note, he should be taking:  Metoprolol succinate 25 mg- 1/2 tablet (12.5 mg) once daily.  I asked that he call back with any questions or concerns, but advised the dose was decreased at his last office visit due to a low HR.

## 2018-02-02 ENCOUNTER — Ambulatory Visit (INDEPENDENT_AMBULATORY_CARE_PROVIDER_SITE_OTHER): Payer: Medicare Other

## 2018-02-02 DIAGNOSIS — Z7901 Long term (current) use of anticoagulants: Secondary | ICD-10-CM

## 2018-02-02 DIAGNOSIS — Z952 Presence of prosthetic heart valve: Secondary | ICD-10-CM

## 2018-02-02 DIAGNOSIS — I350 Nonrheumatic aortic (valve) stenosis: Secondary | ICD-10-CM | POA: Diagnosis not present

## 2018-02-02 LAB — POCT INR: INR: 2.8

## 2018-02-02 NOTE — Patient Instructions (Signed)
Please continue dosage of 1 tablet daily except 1.5 tablet on Mondays, Wednesdays and Fridays.   Recheck in 4 weeks.

## 2018-02-05 ENCOUNTER — Other Ambulatory Visit
Admission: RE | Admit: 2018-02-05 | Discharge: 2018-02-05 | Disposition: A | Discharge status: Home / Self Care | Payer: Medicare Other | Source: Ambulatory Visit | Attending: Ophthalmology | Admitting: Ophthalmology

## 2018-02-05 DIAGNOSIS — G453 Amaurosis fugax: Secondary | ICD-10-CM | POA: Diagnosis present

## 2018-02-05 LAB — CBC WITH DIFFERENTIAL/PLATELET
Basophils Absolute: 0 10*3/uL (ref 0–0.1)
Basophils Relative: 0 %
Eosinophils Absolute: 0.3 10*3/uL (ref 0–0.7)
Eosinophils Relative: 4 %
HCT: 39.5 % — ABNORMAL LOW (ref 40.0–52.0)
HEMOGLOBIN: 13.2 g/dL (ref 13.0–18.0)
LYMPHS ABS: 1 10*3/uL (ref 1.0–3.6)
LYMPHS PCT: 16 %
MCH: 30.8 pg (ref 26.0–34.0)
MCHC: 33.3 g/dL (ref 32.0–36.0)
MCV: 92.5 fL (ref 80.0–100.0)
Monocytes Absolute: 0.6 10*3/uL (ref 0.2–1.0)
Monocytes Relative: 10 %
NEUTROS ABS: 4.1 10*3/uL (ref 1.4–6.5)
NEUTROS PCT: 70 %
PLATELETS: 192 10*3/uL (ref 150–440)
RBC: 4.28 MIL/uL — AB (ref 4.40–5.90)
RDW: 15.1 % — ABNORMAL HIGH (ref 11.5–14.5)
WBC: 5.9 10*3/uL (ref 3.8–10.6)

## 2018-02-05 LAB — SEDIMENTATION RATE: SED RATE: 7 mm/h (ref 0–20)

## 2018-02-05 LAB — C-REACTIVE PROTEIN

## 2018-02-13 NOTE — Progress Notes (Signed)
Patient ID: [name], male    DOB: Sep 29, 1931, 84 y.o.   MRN: [id number]  HPI  [name] is a 84 y/o male with a history of anxiety, c. Diff, CAD, depression, obstructive sleep apnea (CPAP), GERD, GI bleed, HTN, restless leg syndrome, prior tobacco use and chronic heart failure.   Echo from 02/21/17 shows an EF of 55-60% along with severe TR and moderated elevated PA pressure of 54 mm Hg.   Was in the ED 08/16/17 due to HTN. Patient was treated and released.   He presents today for a follow-up visit with a chief complaint of moderate fatigue upon minimal exertion. He describes this as chronic in nature having been present for several years. He has associated shortness of breath, edema, light-headedness, blurry vision and gradual weight gain along with this. He denies any difficulty sleeping, abdominal distention, palpitations, chest pain or cough.   Past Medical History:  Diagnosis Date  . Anxiety 10/11  . Aortic stenosis    a. s/p mechcanical AVR, 2002  . Bradycardia    chronic, no symptoms 07/2010  . C. difficile colitis   . Carotid bruit    dopplers in past, no abnormalities  . Chronic diastolic CHF (congestive heart failure) (HCC)    a. echo 03/2015: EF 60-65%, no RWMA, GR1DD, aortic valve poorly visualized, unable to excluded vegetation, increased transvalvular velocity, mechanical valve is present, mild biatrial enalrgement, mild to mod [name], mod TR, PASP 65 mm Hg  . Coronary artery disease    a. mild, cath, 08/2010; b. medically managed  . Decreased hearing    Right ear  . Depression   . Gastric ulcer   . GERD (gastroesophageal reflux disease)   . Hypertension    BP higher than usual 04/19/10; amlodipine increased by telephone  . RLS (restless legs syndrome) 08/23/2015  . S/P AVR    a. St. Jude. mechanical 2002; b. echo 08/2010 EF 60%, trival AI, mild [name], AVR working well; c. on longterm warfarin tx  . SOB (shortness of breath) 10/11   08/2010,Episodes at 5 AM,  eventually felt to be anxiety, after complete workup including catheterization, pt greatly improved with anxiety meds 11/11   Past Surgical History:  Procedure Laterality Date  . CARDIAC CATHETERIZATION    . ESOPHAGOGASTRODUODENOSCOPY N/A 04/05/2015   Procedure: ESOPHAGOGASTRODUODENOSCOPY (EGD);  Surgeon: Scot Jun, MD;  Location: [facility] ENDOSCOPY;  Service: Endoscopy;  Laterality: N/A;  . ESOPHAGOGASTRODUODENOSCOPY N/A 04/17/2015   Procedure: ESOPHAGOGASTRODUODENOSCOPY (EGD);  Surgeon: Scot Jun, MD;  Location: [facility] ENDOSCOPY;  Service: Endoscopy;  Laterality: N/A;  . ESOPHAGOGASTRODUODENOSCOPY N/A 08/02/2015   Procedure: ESOPHAGOGASTRODUODENOSCOPY (EGD);  Surgeon: Wallace Cullens, MD;  Location[facility] ENDOSCOPY;  Service: Endoscopy;  Laterality: N/A;  . HERNIA REPAIR    . JOINT REPLACEMENT    . TOTAL HIP ARTHROPLASTY    . VALVE REPLACEMENT  1/02   Aortic; echo 3/09 valve working well; echo 10/11 working well; put on Coumadin   Family History  Problem Relation Age of Onset  . Hypertension Mother   . Diabetes type II Mother   . Heart disease Mother   . Heart attack Mother   . Hypertension Father   . Heart disease Father   . Stroke Father   . Stroke Brother   . Stroke Brother   . Hypertension Unknown    Social History   Tobacco Use  . Smoking status: Former Games developer  . Smokeless tobacco: Never Used  Substance Use Topics  .  Alcohol use: Yes    Alcohol/week: 1.8 oz    Types: 3 Glasses of wine per week    Comment: Social   Allergies  Allergen Reactions  . Levofloxacin Nausea Only  . Sulfa Antibiotics Other (See Comments)    Reaction:  Unknown    Prior to Admission medications   Medication Sig Start Date End Date Taking? Authorizing Provider  acetaminophen (TYLENOL) 500 MG tablet Take 500 mg by mouth every 4 (four) hours as needed for mild pain or headache.    Yes [provider]  ALPRAZolam Prudy Feeler(XANAX) 0.5 MG tablet Take 0.5 tablets (0.25 mg total) by mouth daily as  needed for anxiety. Patient taking differently: Take 0.5 mg by mouth daily as needed for anxiety.  04/20/15  Yes Auburn BilberryPatel, Shreyang, MD  amoxicillin (AMOXIL) 500 MG capsule Take 500 mg by mouth as needed. Dental procedures 05/21/17  Yes [provider]  celecoxib (CELEBREX) 100 MG capsule Take 100 mg by mouth as needed.   Yes [provider]  Cetirizine HCl 10 MG CAPS Take 10 mg by mouth daily.   Yes [provider]  citalopram (CELEXA) 20 MG tablet Take 20 mg by mouth daily.   Yes [provider]  cloNIDine (CATAPRES) 0.1 MG tablet Take 2 tablets (0.2 mg total) 2 (two) times daily by mouth. 10/13/17  Yes Gollan, Tollie Pizzaimothy J, MD  doxazosin (CARDURA) 8 MG tablet TAKE 1 TABLET BY MOUTH  TWICE A DAY Patient taking differently: TAKE 1 TABLET BY MOUTH  QD. 09/23/17  Yes Gollan, Tollie Pizzaimothy J, MD  finasteride (PROSCAR) 5 MG tablet Take 5 mg by mouth daily.     Yes [provider]  furosemide (LASIX) 20 MG tablet Take 2 tablets (40 mg total) by mouth 2 (two) times daily as needed. 10/15/17  Yes Antonieta IbaGollan, Timothy J, MD  isosorbide mononitrate (IMDUR) 60 MG 24 hr tablet Take 60 mg by mouth 2 (two) times daily.   Yes [provider]  meloxicam (MOBIC) 7.5 MG tablet Take 7.5 mg by mouth as needed. 09/10/16  Yes [provider]  metoprolol succinate (TOPROL-XL) 25 MG 24 hr tablet Take 0.5 tablets (12.5 mg total) by mouth daily. Patient taking differently: Take 25 mg by mouth daily.  12/29/17  Yes Antonieta IbaGollan, Timothy J, MD  Multiple Vitamin (MULTIVITAMIN WITH MINERALS) TABS tablet Take 1 tablet by mouth daily.   Yes [provider]  olmesartan (BENICAR) 40 MG tablet Take 1 tablet (40 mg total) by mouth daily. 08/13/17  Yes Gollan, Tollie Pizzaimothy J, MD  potassium chloride (K-DUR) 10 MEQ tablet Take 1 tablet (10 mEq total) by mouth daily. 09/10/17  Yes Gollan, Tollie Pizzaimothy J, MD  ranitidine (ZANTAC) 150 MG tablet Take 150 mg by mouth as needed.   Yes [provider]  rOPINIRole (REQUIP) 2 MG tablet TAKE 1 TABLET BY MOUTH 4  TIMES DAILY 09/04/17  Yes York SpanielWillis, Charles K, MD  Tamsulosin HCl (FLOMAX) 0.4 MG CAPS Take 0.4 mg by mouth at bedtime.    Yes [provider]  traZODone (DESYREL) 50 MG tablet Take 50 mg by mouth at bedtime.   Yes [provider]  warfarin (COUMADIN) 5 MG tablet TAKE AS DIRECTED BY  COUMADIN CLINIC 09/23/17  Yes Gollan, Tollie Pizzaimothy J, MD    Review of Systems  Constitutional: Positive for fatigue. Negative for appetite change.  HENT: Positive for hearing loss. Negative for congestion, rhinorrhea and sore throat.   Eyes: Positive for visual disturbance (blurry vision at times and  difficult to focus on reading. ).  Respiratory: Positive for shortness of breath. Negative for chest tightness.   Cardiovascular: Positive for leg swelling. Negative for chest pain and palpitations.  Gastrointestinal: Negative for abdominal distention and abdominal pain.  Endocrine: Negative.   Genitourinary: Negative.   Musculoskeletal: Negative for back pain and neck pain.  Skin: Negative.   Allergic/Immunologic: Negative.   Neurological: Positive for light-headedness. Negative for dizziness.  Hematological: Negative for adenopathy. Bruises/bleeds easily.  Psychiatric/Behavioral: Negative for dysphoric mood and sleep disturbance (wearing CPAP nightly). The patient is not nervous/anxious.    Vitals:   02/17/18 1308  BP: (!) 145/60  Pulse: 65  Resp: 16  Temp: 97.6 F (36.4 C)  TempSrc: Oral  SpO2: 100%  Weight: 212 lb (96.2 kg)  Height: 5\' 11"  (1.803 m)   Wt Readings from Last 3 Encounters:  02/17/18 212 lb (96.2 kg)  01/09/18 208 lb 8 oz (94.6 kg)  12/29/17 209 lb (94.8 kg)   Lab Results  Component Value Date   CREATININE 0.99 08/16/2017   CREATININE 1.06 03/20/2017   CREATININE 0.84 02/22/2017    Physical Exam  Constitutional: He is oriented to person, place, and time. He appears well-developed and well-nourished.   HENT:  Head: Normocephalic and atraumatic.  Right Ear: Decreased hearing is noted.  Left Ear: Decreased hearing is noted.  Neck: Normal range of motion. Neck supple. No JVD present.  Cardiovascular: Normal rate and regular rhythm.  Pulmonary/Chest: Effort normal. No respiratory distress. He has no wheezes. He has no rales.  Abdominal: Soft. He exhibits no distension. There is no tenderness.  Musculoskeletal: He exhibits edema (trace pitting edema in bilateral lower legs). He exhibits no tenderness.  Neurological: He is alert and oriented to person, place, and time.  Skin: Skin is warm and dry.  Psychiatric: He has a normal mood and affect. His behavior is normal. Thought content normal.  Nursing note and vitals reviewed.  Assessment & Plan:  1: Chronic heart failure with preserved ejection fraction- - NYHA class III - euvolemic today - weighing daily and he was instructed to call for an overnight weight gain of >2 pounds or a weekly weight gain of >5 pounds.  - weight up 4 pounds since 01/09/18 - not adding salt to his food but does eat often at [employer] and he says that sometimes the food tastes salty.  - patient reports receiving the flu vaccine for this season - saw cardiologist Mariah Milling) 12/29/17 - he was confused about his metoprolol dose and has been taking 25mg  daily. Per cardiology note, he should be breaking the 25mg  tablet in 1/2 and taking 1/2 tablet daily (12.5mg ). Written information about this provided  2: HTN- - BP looks good today - saw PCP Letitia Libra) 12/24/17 - BMP from 12/17/17 reviewed and shows sodium 130, potassium 4.9 and GFR 71  Patient did not bring his medications nor a list. Each medication was verbally reviewed with the patient and he was encouraged to bring the bottles to every visit to confirm accuracy of list.  Patient opts to not make a return appointment at this time. Advised him that he could call back at any time in the future  to make another appointment.

## 2018-02-17 ENCOUNTER — Other Ambulatory Visit: Payer: Self-pay

## 2018-02-17 ENCOUNTER — Encounter: Payer: Self-pay | Admitting: Family

## 2018-02-17 ENCOUNTER — Ambulatory Visit: Payer: Medicare Other | Attending: Family | Admitting: Family

## 2018-02-17 VITALS — BP 145/60 | HR 65 | Temp 97.6°F | Resp 16 | Ht 71.0 in | Wt 212.0 lb

## 2018-02-17 DIAGNOSIS — Z9889 Other specified postprocedural states: Secondary | ICD-10-CM | POA: Diagnosis not present

## 2018-02-17 DIAGNOSIS — A0472 Enterocolitis due to Clostridium difficile, not specified as recurrent: Secondary | ICD-10-CM | POA: Insufficient documentation

## 2018-02-17 DIAGNOSIS — Z8249 Family history of ischemic heart disease and other diseases of the circulatory system: Secondary | ICD-10-CM | POA: Insufficient documentation

## 2018-02-17 DIAGNOSIS — I110 Hypertensive heart disease with heart failure: Secondary | ICD-10-CM | POA: Diagnosis not present

## 2018-02-17 DIAGNOSIS — Z882 Allergy status to sulfonamides status: Secondary | ICD-10-CM | POA: Insufficient documentation

## 2018-02-17 DIAGNOSIS — F419 Anxiety disorder, unspecified: Secondary | ICD-10-CM | POA: Diagnosis not present

## 2018-02-17 DIAGNOSIS — R42 Dizziness and giddiness: Secondary | ICD-10-CM | POA: Diagnosis not present

## 2018-02-17 DIAGNOSIS — Z7901 Long term (current) use of anticoagulants: Secondary | ICD-10-CM | POA: Insufficient documentation

## 2018-02-17 DIAGNOSIS — Z833 Family history of diabetes mellitus: Secondary | ICD-10-CM | POA: Insufficient documentation

## 2018-02-17 DIAGNOSIS — I350 Nonrheumatic aortic (valve) stenosis: Secondary | ICD-10-CM | POA: Diagnosis not present

## 2018-02-17 DIAGNOSIS — I5032 Chronic diastolic (congestive) heart failure: Secondary | ICD-10-CM

## 2018-02-17 DIAGNOSIS — Z8719 Personal history of other diseases of the digestive system: Secondary | ICD-10-CM | POA: Diagnosis not present

## 2018-02-17 DIAGNOSIS — H9190 Unspecified hearing loss, unspecified ear: Secondary | ICD-10-CM | POA: Diagnosis not present

## 2018-02-17 DIAGNOSIS — Z791 Long term (current) use of non-steroidal anti-inflammatories (NSAID): Secondary | ICD-10-CM | POA: Insufficient documentation

## 2018-02-17 DIAGNOSIS — G4733 Obstructive sleep apnea (adult) (pediatric): Secondary | ICD-10-CM | POA: Diagnosis not present

## 2018-02-17 DIAGNOSIS — Z96649 Presence of unspecified artificial hip joint: Secondary | ICD-10-CM | POA: Diagnosis not present

## 2018-02-17 DIAGNOSIS — F329 Major depressive disorder, single episode, unspecified: Secondary | ICD-10-CM | POA: Insufficient documentation

## 2018-02-17 DIAGNOSIS — I2510 Atherosclerotic heart disease of native coronary artery without angina pectoris: Secondary | ICD-10-CM | POA: Insufficient documentation

## 2018-02-17 DIAGNOSIS — Z823 Family history of stroke: Secondary | ICD-10-CM | POA: Diagnosis not present

## 2018-02-17 DIAGNOSIS — K219 Gastro-esophageal reflux disease without esophagitis: Secondary | ICD-10-CM | POA: Insufficient documentation

## 2018-02-17 DIAGNOSIS — G2581 Restless legs syndrome: Secondary | ICD-10-CM | POA: Insufficient documentation

## 2018-02-17 DIAGNOSIS — Z87891 Personal history of nicotine dependence: Secondary | ICD-10-CM | POA: Insufficient documentation

## 2018-02-17 DIAGNOSIS — H538 Other visual disturbances: Secondary | ICD-10-CM | POA: Diagnosis not present

## 2018-02-17 DIAGNOSIS — R0602 Shortness of breath: Secondary | ICD-10-CM | POA: Diagnosis not present

## 2018-02-17 DIAGNOSIS — I10 Essential (primary) hypertension: Secondary | ICD-10-CM

## 2018-02-17 DIAGNOSIS — Z952 Presence of prosthetic heart valve: Secondary | ICD-10-CM | POA: Diagnosis not present

## 2018-02-17 DIAGNOSIS — Z8619 Personal history of other infectious and parasitic diseases: Secondary | ICD-10-CM | POA: Insufficient documentation

## 2018-02-17 DIAGNOSIS — Z79899 Other long term (current) drug therapy: Secondary | ICD-10-CM | POA: Insufficient documentation

## 2018-02-17 DIAGNOSIS — Z888 Allergy status to other drugs, medicaments and biological substances status: Secondary | ICD-10-CM | POA: Insufficient documentation

## 2018-02-17 NOTE — Patient Instructions (Addendum)
Continue weighing daily and call for an overnight weight gain of > 2 pounds or a weekly weight gain of >5 pounds.  Decrease metoprolol to 12.5mg  daily. Break the current tablet in 1/2 and take 1/2 tablet daily

## 2018-02-23 ENCOUNTER — Telehealth: Payer: Self-pay | Admitting: Cardiovascular Disease

## 2018-02-23 NOTE — Telephone Encounter (Signed)
Received fax from [facility] for refill of losartan with no dosage or frequency. Reviewed patients chart further and do not see that he has been prescribed this medication at any point. Called pharmacy and advised to not refill this medication.

## 2018-03-01 ENCOUNTER — Telehealth: Payer: Self-pay | Admitting: Cardiology

## 2018-03-01 NOTE — Telephone Encounter (Signed)
[name] BP dropped to 95 systolic yesterday and he did not feel well.  Today BP 135/70 he will take his usual meds except losartan- which he will hold today, he has coumadin check tomorrow with Dr. Mariah MillingGollan and he will discuss with them at that time.

## 2018-03-02 ENCOUNTER — Ambulatory Visit: Payer: Medicare Other

## 2018-03-02 ENCOUNTER — Other Ambulatory Visit: Payer: Self-pay | Admitting: Cardiovascular Disease

## 2018-03-02 ENCOUNTER — Ambulatory Visit (INDEPENDENT_AMBULATORY_CARE_PROVIDER_SITE_OTHER): Payer: Medicare Other

## 2018-03-02 ENCOUNTER — Encounter: Payer: Self-pay | Admitting: Cardiovascular Disease

## 2018-03-02 ENCOUNTER — Ambulatory Visit (INDEPENDENT_AMBULATORY_CARE_PROVIDER_SITE_OTHER): Payer: Medicare Other | Admitting: Cardiovascular Disease

## 2018-03-02 VITALS — BP 144/72 | HR 69 | Ht 71.0 in | Wt 207.0 lb

## 2018-03-02 DIAGNOSIS — Z952 Presence of prosthetic heart valve: Secondary | ICD-10-CM | POA: Diagnosis not present

## 2018-03-02 DIAGNOSIS — Z7901 Long term (current) use of anticoagulants: Secondary | ICD-10-CM | POA: Diagnosis not present

## 2018-03-02 DIAGNOSIS — I350 Nonrheumatic aortic (valve) stenosis: Secondary | ICD-10-CM | POA: Diagnosis not present

## 2018-03-02 LAB — POCT INR: INR: 2.9

## 2018-03-02 MED ORDER — CLONIDINE HCL 0.1 MG PO TABS: 0.1000 mg | ORAL_TABLET | Freq: Two times a day (BID) | ORAL | 3 refills | Status: DC

## 2018-03-02 NOTE — Patient Instructions (Addendum)
Medication Instructions:  Your physician has recommended you make the following change in your medication:  1. STAY on Clonidine 0.1 mg twice a day 2. STAY Metoprolol 25 mg 1/2 tablet once daily 3. HOLD Losartan 4. HOLD Olmesartan   Labwork:  No new labs needed  Testing/Procedures:  No further testing at this time  Follow-Up: It was a pleasure seeing you in the office today. Please call us if you have new issues that need to be addressed before your next appt.  [phone]  Your physician wants you to follow-up in: 6 months.  You will receive a reminder letter in the mail two months in advance. If you don't receive a letter, please call our office to schedule the follow-up appointment.  If you need a refill on your cardiac medications before your next appointment, please call your pharmacy.  For educational health videos Log in to : [email or web address] Or : [email or web address], password : [email or web address]

## 2018-03-02 NOTE — Telephone Encounter (Signed)
Seen in the office today.

## 2018-03-02 NOTE — Progress Notes (Signed)
Cardiology Office Note  Date:  03/02/2018   ID:  [name], DOB 1930-12-01, MRN [id number]  PCP:  Gracelyn Nurse, MD   Chief Complaint  Patient presents with  . Other    Patient c/o SOB and patient states his blood pressure has been running up and down. Meds reviewed verbally with patient.     HPI:  84 year old male with  h/o aortic stenosis  s/p [employer] mechanical aortic valve replacement in 2002 on chronic Coumadin therapy,  mild CAD by cardiac cath in 2011 managed medically,  GERD,  HTN,  carotid bruits,  Chronic hyponatremia asymptomatic bradycardia,  RLS, anxiety, depression,  GI bleed OSA on CPAP osteoarthritis s/p total left hip replacement on 03/21/2015 who was discharged from [facility] the above surgery with a hgb of 8 (baseline 12), he was started on po iron replacement therapy, who presented to on 04/02/15 with acute onset of weakness, melena, BRBPR, and was found to have a hgb of 5.1. Aspirin and warfarin initially held, restarted at a later date, S/p EGD Ejection fraction greater than 55% March 2018, moderately elevated right heart pressures He presents today for follow-up of his aortic valve replacement And hypertension  In follow-up today he reports that he called her on call line for assistance, blood pressure was running low, 90 systolic He has been out of his on olmesartan secondary to recall Was given losartan, he has been off this for several days.    It was recommended to him that he cut back on his clonidine down to 0.1 mg twice daily He has cut down on the clonidine, prior to that reported having dizziness Chronic shortness of breath No significant change in his breathing  EKG performed in the office today showing New atrial flutter ventricular rate 69 bpm, nonspecific ST abnormality Personally reviewed by myself  At baseline typically does some regular exercise, water aerobics, sparingly Can walk in his hallway outside his apartment  Sits  hours at a time, Crosswords all day Sits with his feet down Right greater than left lower extremity swelling, is not bothered by it  Tolerating his CPAP Better sleep, less daytime somnolence   Lab work reviewed with him in detail Total chol 153, LDL 91 Sodium 130 CR 1.0  Other past medical history reviewed  was in the ED 08/16/17 due to HTN. Patient was treated and released.  Blood pressure was 215/100  Recently seen in the emergency room Twice for hypertension On a prior office visit I recommended he increase isosorbide to 30 mg twice a day Also was recommended to double his Diovan dose He was given 1 dose clonidine in the emergency room On a follow-up visit with ryan Dunn it was recommended he increase isosorbide to 30 mg 3 times a day, increase metoprolol up to 50 mg daily He showed up and began in the emergency room several days later  Previous Echocardiogram shows normal LV function, well-functioning aortic valve, Moderately elevated right heart pressures with severe TR  Not on hydralazine secondary to side effects Clonidine was fatigue dry mouth Prior notes indicating amlodipine 10 mg caused leg swelling,  Though he reports stopping the amlodipine did not change his swelling  Other past medical history reviewed 48 hour Holter monitor 12/2015 Normal sinus rhythm Average heart beat 70 bpm, maximum heart rate 114 bpm, minimum 45 bpm (at 3 AM) Frequent APCs, couplets, bigeminy's with short runs, longest run of 5 beats (12% of all beats),  leg edema is better by holding amlodipine 10 mg Previous drop in Hemoglobin 9 improved up to 14  Previous Echocardiogram for Feb 22nd 2017 reviewed with him showing well-functioning aortic valve  Previous cardiac catheterization report from 2011 reviewed with him showing 20% proximal LAD, 40% mid LAD disease  PMH:   has a past medical history of Anxiety (10/11), Aortic stenosis, Bradycardia, C. difficile colitis, Carotid bruit,  Chronic diastolic CHF (congestive heart failure) (HCC), Coronary artery disease, Decreased hearing, Depression, Gastric ulcer, GERD (gastroesophageal reflux disease), Hypertension, RLS (restless legs syndrome) (08/23/2015), S/P AVR, and SOB (shortness of breath) (10/11).  PSH:    Past Surgical History:  Procedure Laterality Date  . CARDIAC CATHETERIZATION    . ESOPHAGOGASTRODUODENOSCOPY N/A 04/05/2015   Procedure: ESOPHAGOGASTRODUODENOSCOPY (EGD);  Surgeon: Scot Jun, MD;  Location: [facility] ENDOSCOPY;  Service: Endoscopy;  Laterality: N/A;  . ESOPHAGOGASTRODUODENOSCOPY N/A 04/17/2015   Procedure: ESOPHAGOGASTRODUODENOSCOPY (EGD);  Surgeon: Scot Jun, MD;  Location: [facility] ENDOSCOPY;  Service: Endoscopy;  Laterality: N/A;  . ESOPHAGOGASTRODUODENOSCOPY N/A 08/02/2015   Procedure: ESOPHAGOGASTRODUODENOSCOPY (EGD);  Surgeon: Wallace Cullens, MD;  Location: [facility] ENDOSCOPY;  Service: Endoscopy;  Laterality: N/A;  . HERNIA REPAIR    . JOINT REPLACEMENT    . TOTAL HIP ARTHROPLASTY    . VALVE REPLACEMENT  1/02   Aortic; echo 3/09 valve working well; echo 10/11 working well; put on Coumadin    Current Outpatient Medications on File Prior to Visit  Medication Sig Dispense Refill  . acetaminophen (TYLENOL) 500 MG tablet Take 500 mg by mouth every 4 (four) hours as needed for mild pain or headache.     . ALPRAZolam (XANAX) 0.5 MG tablet Take 0.5 tablets (0.25 mg total) by mouth daily as needed for anxiety. (Patient taking differently: Take 0.5 mg by mouth daily as needed for anxiety. ) 10 tablet 0  . amoxicillin (AMOXIL) 500 MG capsule Take 500 mg by mouth as needed. Dental procedures  1  . celecoxib (CELEBREX) 100 MG capsule Take 100 mg by mouth as needed.    . Cetirizine HCl 10 MG CAPS Take 10 mg by mouth daily.    . citalopram (CELEXA) 20 MG tablet Take 20 mg by mouth daily.    . cloNIDine (CATAPRES) 0.1 MG tablet Take 2 tablets (0.2 mg total) 2 (two) times daily by mouth. 360 tablet 3  . doxazosin  (CARDURA) 8 MG tablet TAKE 1 TABLET BY MOUTH  TWICE A DAY (Patient taking differently: TAKE 1 TABLET BY MOUTH  QD.) 180 tablet 1  . finasteride (PROSCAR) 5 MG tablet Take 5 mg by mouth daily.      . furosemide (LASIX) 20 MG tablet Take 2 tablets (40 mg total) by mouth 2 (two) times daily as needed. 12 tablet 0  . isosorbide mononitrate (IMDUR) 60 MG 24 hr tablet Take 60 mg by mouth 2 (two) times daily.    . meloxicam (MOBIC) 7.5 MG tablet Take 7.5 mg by mouth as needed.    . metoprolol succinate (TOPROL-XL) 25 MG 24 hr tablet Take 0.5 tablets (12.5 mg total) by mouth daily. 45 tablet 3  . Multiple Vitamin (MULTIVITAMIN WITH MINERALS) TABS tablet Take 1 tablet by mouth daily.    . potassium chloride (K-DUR) 10 MEQ tablet Take 1 tablet (10 mEq total) by mouth daily. 90 tablet 3  . ranitidine (ZANTAC) 150 MG tablet Take 150 mg by mouth as needed.    Marland Kitchen rOPINIRole (REQUIP) 2 MG tablet TAKE 1 TABLET BY MOUTH  4  TIMES DAILY 360 tablet 3  . Tamsulosin HCl (FLOMAX) 0.4 MG CAPS Take 0.4 mg by mouth at bedtime.     . traZODone (DESYREL) 50 MG tablet Take 50 mg by mouth at bedtime.    Marland Kitchen warfarin (COUMADIN) 5 MG tablet TAKE AS DIRECTED BY  COUMADIN CLINIC 150 tablet 1   No current facility-administered medications on file prior to visit.     Allergies:   Levofloxacin and Sulfa antibiotics   Social History:  The patient  reports that he has quit smoking. He has never used smokeless tobacco. He reports that he drinks about 1.8 oz of alcohol per week. He reports that he does not use drugs.   Family History:   family history includes Diabetes type II in his mother; Heart attack in his mother; Heart disease in his father and mother; Hypertension in his father, mother, and unknown relative; Stroke in his brother, brother, and father.    Review of Systems: Review of Systems  Constitutional: Negative.   Respiratory: Negative.   Cardiovascular: Positive for leg swelling.  Gastrointestinal: Negative.         Abdominal bloating  Musculoskeletal: Negative.   Neurological: Positive for dizziness.  Psychiatric/Behavioral: Negative.   All other systems reviewed and are negative.    PHYSICAL EXAM: VS:  BP (!) 144/72 (BP Location: Left Arm, Patient Position: Sitting, Cuff Size: Normal)   Pulse 69   Ht 5\' 11"  (1.803 m)   Wt 207 lb (93.9 kg)   BMI 28.87 kg/m  , BMI Body mass index is 28.87 kg/m. Constitutional:  oriented to person, place, and time. No distress.  HENT:  Head: Normocephalic and atraumatic.  Eyes:  no discharge. No scleral icterus.  Neck: Normal range of motion. Neck supple. No JVD present.  Cardiovascular: Normal rate, regular rhythm, normal heart sounds and intact distal pulses. Exam reveals no gallop and no friction rub. No edema No murmur heard. Pulmonary/Chest: Effort normal and breath sounds normal. No stridor. No respiratory distress.  no wheezes.  no rales.  no tenderness.  Abdominal: Soft.  no distension.  no tenderness.  Musculoskeletal: Normal range of motion.  no  tenderness or deformity.  Neurological:  normal muscle tone. Coordination normal. No atrophy Skin: Skin is warm and dry. No rash noted. not diaphoretic.  Psychiatric:  normal mood and affect. behavior is normal. Thought content normal.    Recent Labs: 08/16/2017: BUN 23; Creatinine, Ser 0.99; Potassium 4.6; Sodium 131 02/05/2018: Hemoglobin 13.2; Platelets 192    Lipid Panel No results found for: CHOL, HDL, LDLCALC, TRIG    Wt Readings from Last 3 Encounters:  12/09/16 200 lb (90.7 kg)  11/08/16 183 lb (83 kg) (wrong weight?)  10/15/16 202 lb 4 oz (91.7 kg)   Weight  08/13/2017 is 217 pounds Weight September 26, 2017 is 211 pounds   ASSESSMENT AND PLAN:  Essential hypertension - Plan: EKG 12-Lead Blood pressure running low recently Recommend he continue to take clonidine 0.1 mg twice daily, down from 0.2 twice daily Also will continue metoprolol succinate 12.5 daily We will hold the ARB for  now  Coronary artery disease involving native coronary artery of native heart without angina pectoris - Plan: EKG 12-Lead Stable, denies any anginal symptoms  S/P AVR (aortic valve replacement) Well-functioning aortic valve Tolerating anticoagulation Repeat echocardiogram and follow-up  SOB (shortness of breath) Taking Lasix daily, denies having significant shortness of breath Symptoms are chronic, stable  Lower extremity edema diastolic CHF.  Elevated right  heart pressures on previous echocardiogram Wears compression hose daily, sits with his legs down all day Takes 2 Lasix in the morning  Long discussion with nurse and with patient  Total encounter time more than 45 minutes  Greater than 50% was spent in counseling and coordination of care with the patient  Disposition:   F/U  6 months   No orders of the defined types were placed in this encounter.    Signed, Dossie Arbourim Gollan, M.D., Ph.D. 03/02/2018  [facility], [place] [phone]

## 2018-03-02 NOTE — Patient Instructions (Signed)
Please continue dosage of 1 tablet daily except 1.5 tablet on Mondays, Wednesdays and Fridays.   Recheck in 5 weeks.

## 2018-04-06 ENCOUNTER — Ambulatory Visit (INDEPENDENT_AMBULATORY_CARE_PROVIDER_SITE_OTHER): Payer: Medicare Other

## 2018-04-06 DIAGNOSIS — Z952 Presence of prosthetic heart valve: Secondary | ICD-10-CM

## 2018-04-06 DIAGNOSIS — Z7901 Long term (current) use of anticoagulants: Secondary | ICD-10-CM

## 2018-04-06 DIAGNOSIS — I350 Nonrheumatic aortic (valve) stenosis: Secondary | ICD-10-CM | POA: Diagnosis not present

## 2018-04-06 LAB — POCT INR: INR: 3.7

## 2018-04-06 NOTE — Patient Instructions (Signed)
Please skip coumadin tonight, then continue dosage of 1 tablet daily except 1.5 tablet on Mondays, Wednesdays and Fridays.   Recheck in 4 weeks.

## 2018-04-13 ENCOUNTER — Other Ambulatory Visit: Payer: Self-pay | Admitting: Cardiovascular Disease

## 2018-04-13 NOTE — Telephone Encounter (Signed)
Please review dose of Lasix 20 mg twice a day.  Dr. Windell Hummingbird last note says Lasix 20 mg take two tablets daily in the am.

## 2018-05-04 ENCOUNTER — Telehealth: Payer: Self-pay | Admitting: Cardiovascular Disease

## 2018-05-04 ENCOUNTER — Ambulatory Visit (INDEPENDENT_AMBULATORY_CARE_PROVIDER_SITE_OTHER): Payer: Medicare Other

## 2018-05-04 DIAGNOSIS — Z7901 Long term (current) use of anticoagulants: Secondary | ICD-10-CM

## 2018-05-04 DIAGNOSIS — Z952 Presence of prosthetic heart valve: Secondary | ICD-10-CM | POA: Diagnosis not present

## 2018-05-04 DIAGNOSIS — I350 Nonrheumatic aortic (valve) stenosis: Secondary | ICD-10-CM

## 2018-05-04 LAB — POCT INR: INR: 2.5 (ref 2.0–3.0)

## 2018-05-04 NOTE — Patient Instructions (Signed)
Please continue dosage of 1 tablet daily except 1.5 tablet on Mondays, Wednesdays and Fridays.   Recheck in 5 weeks.

## 2018-06-05 NOTE — Telephone Encounter (Signed)
Close errounenous encounter

## 2018-06-08 ENCOUNTER — Ambulatory Visit (INDEPENDENT_AMBULATORY_CARE_PROVIDER_SITE_OTHER): Payer: Medicare Other

## 2018-06-08 DIAGNOSIS — Z7901 Long term (current) use of anticoagulants: Secondary | ICD-10-CM | POA: Diagnosis not present

## 2018-06-08 DIAGNOSIS — I350 Nonrheumatic aortic (valve) stenosis: Secondary | ICD-10-CM | POA: Diagnosis not present

## 2018-06-08 DIAGNOSIS — Z952 Presence of prosthetic heart valve: Secondary | ICD-10-CM | POA: Diagnosis not present

## 2018-06-08 LAB — POCT INR: INR: 3.8 — AB (ref 2.0–3.0)

## 2018-06-08 NOTE — Patient Instructions (Signed)
Please take 1/2 tablet today, then  continue dosage of 1 tablet daily except 1.5 tablet on Mondays, Wednesdays and Fridays.   Recheck in 4 weeks.

## 2018-06-22 ENCOUNTER — Ambulatory Visit: Payer: Medicare Other | Admitting: Podiatry

## 2018-06-22 ENCOUNTER — Encounter: Payer: Self-pay | Admitting: Podiatry

## 2018-06-22 VITALS — BP 182/88 | HR 68

## 2018-06-22 DIAGNOSIS — B351 Tinea unguium: Secondary | ICD-10-CM

## 2018-06-22 DIAGNOSIS — M79674 Pain in right toe(s): Secondary | ICD-10-CM

## 2018-06-22 DIAGNOSIS — L84 Corns and callosities: Secondary | ICD-10-CM

## 2018-06-22 DIAGNOSIS — M2042 Other hammer toe(s) (acquired), left foot: Secondary | ICD-10-CM | POA: Diagnosis not present

## 2018-06-22 DIAGNOSIS — M79675 Pain in left toe(s): Secondary | ICD-10-CM | POA: Diagnosis not present

## 2018-06-22 NOTE — Progress Notes (Signed)
This patient presents to the office with chief complaint of painful second toenail area left foot.  He says the pain occurs walking and wearing his shoes.  He says he has been developing pain for months.  He says his nails are painful when walking.  He presents to the office for preventative foot care services.  General Appearance  Alert, conversant and in no acute stress.  Vascular  Dorsalis pedis and posterior tibial  pulses are palpable  bilaterally.  Capillary return is within normal limits  bilaterally. Temperature is within normal limits  bilaterally.  Neurologic  Senn-Weinstein monofilament wire test within normal limits  bilaterally. Muscle power within normal limits bilaterally.  Nails Thick disfigured discolored nails with subungual debris  from hallux to fifth toes bilaterally. No evidence of bacterial infection or drainage bilaterally.  Orthopedic  No limitations of motion of motion feet .  No crepitus or effusions noted.  Hammer toe deformities second especially.  Skin  normotropic skin with no porokeratosis noted bilaterally.  No signs of infections or ulcers noted.  Clavi second toe left foot.  Onychomycosis  Clavi second left due to hammer toes  IE  Debride nails  X 10.  Debride clavi.  Padding dispensed.  RTC 3 months.   Helane GuntherGregory Gareth Fitzner DPM

## 2018-07-06 ENCOUNTER — Ambulatory Visit: Payer: Medicare Other

## 2018-07-06 DIAGNOSIS — Z952 Presence of prosthetic heart valve: Secondary | ICD-10-CM

## 2018-07-06 DIAGNOSIS — Z7901 Long term (current) use of anticoagulants: Secondary | ICD-10-CM

## 2018-07-06 DIAGNOSIS — I350 Nonrheumatic aortic (valve) stenosis: Secondary | ICD-10-CM | POA: Diagnosis not present

## 2018-07-06 LAB — POCT INR: INR: 3.1 — AB (ref 2.0–3.0)

## 2018-07-06 NOTE — Patient Instructions (Signed)
Please have a large serving of greens today and continue dosage of 1 tablet daily except 1.5 tablet on Mondays, Wednesdays and Fridays.   Recheck in 4 weeks.

## 2018-07-13 ENCOUNTER — Encounter: Payer: Self-pay | Admitting: Student in an Organized Health Care Education/Training Program

## 2018-07-13 ENCOUNTER — Ambulatory Visit
Payer: Medicare Other | Attending: Student in an Organized Health Care Education/Training Program | Admitting: Student in an Organized Health Care Education/Training Program

## 2018-07-13 ENCOUNTER — Other Ambulatory Visit: Payer: Self-pay

## 2018-07-13 VITALS — BP 162/82 | HR 68 | Temp 97.5°F | Resp 16 | Ht 71.0 in | Wt 207.0 lb

## 2018-07-13 DIAGNOSIS — Z888 Allergy status to other drugs, medicaments and biological substances status: Secondary | ICD-10-CM | POA: Diagnosis not present

## 2018-07-13 DIAGNOSIS — G894 Chronic pain syndrome: Secondary | ICD-10-CM | POA: Diagnosis present

## 2018-07-13 DIAGNOSIS — K219 Gastro-esophageal reflux disease without esophagitis: Secondary | ICD-10-CM | POA: Diagnosis not present

## 2018-07-13 DIAGNOSIS — Z96649 Presence of unspecified artificial hip joint: Secondary | ICD-10-CM | POA: Insufficient documentation

## 2018-07-13 DIAGNOSIS — M1712 Unilateral primary osteoarthritis, left knee: Secondary | ICD-10-CM | POA: Diagnosis not present

## 2018-07-13 DIAGNOSIS — M25562 Pain in left knee: Secondary | ICD-10-CM

## 2018-07-13 DIAGNOSIS — Z79899 Other long term (current) drug therapy: Secondary | ICD-10-CM | POA: Insufficient documentation

## 2018-07-13 DIAGNOSIS — G4733 Obstructive sleep apnea (adult) (pediatric): Secondary | ICD-10-CM | POA: Insufficient documentation

## 2018-07-13 DIAGNOSIS — Z952 Presence of prosthetic heart valve: Secondary | ICD-10-CM

## 2018-07-13 DIAGNOSIS — Z791 Long term (current) use of non-steroidal anti-inflammatories (NSAID): Secondary | ICD-10-CM | POA: Insufficient documentation

## 2018-07-13 DIAGNOSIS — F329 Major depressive disorder, single episode, unspecified: Secondary | ICD-10-CM | POA: Diagnosis not present

## 2018-07-13 DIAGNOSIS — R001 Bradycardia, unspecified: Secondary | ICD-10-CM | POA: Diagnosis not present

## 2018-07-13 DIAGNOSIS — Z8719 Personal history of other diseases of the digestive system: Secondary | ICD-10-CM | POA: Insufficient documentation

## 2018-07-13 DIAGNOSIS — I350 Nonrheumatic aortic (valve) stenosis: Secondary | ICD-10-CM | POA: Insufficient documentation

## 2018-07-13 DIAGNOSIS — F419 Anxiety disorder, unspecified: Secondary | ICD-10-CM | POA: Insufficient documentation

## 2018-07-13 DIAGNOSIS — I2510 Atherosclerotic heart disease of native coronary artery without angina pectoris: Secondary | ICD-10-CM | POA: Diagnosis not present

## 2018-07-13 DIAGNOSIS — I5032 Chronic diastolic (congestive) heart failure: Secondary | ICD-10-CM | POA: Insufficient documentation

## 2018-07-13 DIAGNOSIS — I110 Hypertensive heart disease with heart failure: Secondary | ICD-10-CM | POA: Diagnosis not present

## 2018-07-13 DIAGNOSIS — G2581 Restless legs syndrome: Secondary | ICD-10-CM | POA: Insufficient documentation

## 2018-07-13 DIAGNOSIS — Z882 Allergy status to sulfonamides status: Secondary | ICD-10-CM | POA: Diagnosis not present

## 2018-07-13 DIAGNOSIS — Z9889 Other specified postprocedural states: Secondary | ICD-10-CM | POA: Insufficient documentation

## 2018-07-13 DIAGNOSIS — Z7901 Long term (current) use of anticoagulants: Secondary | ICD-10-CM | POA: Diagnosis not present

## 2018-07-13 DIAGNOSIS — G8929 Other chronic pain: Secondary | ICD-10-CM

## 2018-07-13 DIAGNOSIS — Z87891 Personal history of nicotine dependence: Secondary | ICD-10-CM | POA: Insufficient documentation

## 2018-07-13 NOTE — Progress Notes (Signed)
Patient's Name: [name]  MRN: [id number]  Referring Provider: Hessie Knows, MD  DOB: 17-Jul-1931  PCP: Baxter Hire, MD  DOS: 07/13/2018  Note by: Gillis Santa, MD  Service setting: Ambulatory outpatient  Specialty: Interventional Pain Management  Location: [facility] (AMB) Pain Management Facility    Patient type: New patient ("FAST-TRACK" Evaluation)   Warning: This referral option does not include the extensive pharmacological evaluation required for [place] to take over the patient's medication management. The "Fast-Track" system is designed to bypass the new patient referral waiting list, as well as the normal patient evaluation process, in order to provide a patient in distress with a timely pain management intervention. Because the system was not designed to unfairly get a patient into our pain practice ahead of those already waiting, certain restrictions apply. By requesting a "Fast-Track" consult, the referring physician has opted to continue managing the patient's medications in order to get interventional urgent care.  Primary Reason for Visit: Interventional Pain Management Treatment. CC: Knee Pain (left)    HPI  [name] is a 84 y.o. year old, male patient, who comes today for a  "Fast-Track" new patient evaluation, as requested by Hessie Knows, MD. The patient has been made aware that this type of referral option is reserved for the Interventional Pain Management portion of our practice and completely excludes the option of medication management. His primarily concern today is the Knee Pain (left)  Pain Assessment: Location: Left Knee Radiating: denies Onset: More than a month ago Duration: Chronic pain Quality: Nagging, Sharp Severity: 7 /10 (subjective, self-reported pain score)  Note: Reported level is compatible with observation.                         When using our objective Pain Scale, levels between 6 and 10/10 are said to belong in an emergency room, as it  progressively worsens from a 6/10, described as severely limiting, requiring emergency care not usually available at an outpatient pain management facility. At a 6/10 level, communication becomes difficult and requires great effort. Assistance to reach the emergency department may be required. Facial flushing and profuse sweating along with potentially dangerous increases in heart rate and blood pressure will be evident. Effect on ADL: slows pt down Timing: Intermittent Modifying factors: hemp freeze cream BP: (!) 162/82  HR: 68  Onset and Duration: Present longer than 3 months Cause of pain: Unknown Severity: No change since onset, NAS-11 at its worse: 8/10, NAS-11 at its best: 5/10, NAS-11 now: 4/10 and NAS-11 on the average: 5/10 Timing: no answer here Aggravating Factors: Walking Alleviating Factors: Cold packs Associated Problems: Constipation and Depression Quality of Pain: Aching Previous Examinations or Tests: X-rays Previous Treatments: Trigger point injections  The patient comes into the clinics today, referred to [place] for a cooled radiofrequency ablation of left knee genicular nerves.  Of note patient has a history of left knee osteoarthritis.  He has been ambulating with a walker.  Patient has tried intra-articular knee steroid injections in the past which were only moderately effective.  Of note patient does have a history of an aortic valve replacement and is anticoagulated with warfarin.  Meds   Current Outpatient Medications:  .  Cetirizine HCl 10 MG CAPS, Take 10 mg by mouth daily., Disp: , Rfl:  .  citalopram (CELEXA) 20 MG tablet, Take 20 mg by mouth daily., Disp: , Rfl:  .  cloNIDine (CATAPRES) 0.1 MG tablet, Take 1 tablet (0.1  mg total) by mouth 2 (two) times daily., Disp: 180 tablet, Rfl: 3 .  doxazosin (CARDURA) 8 MG tablet, TAKE 1 TABLET BY MOUTH  TWICE A DAY, Disp: 180 tablet, Rfl: 1 .  finasteride (PROSCAR) 5 MG tablet, Take 5 mg by mouth daily.  , Disp: , Rfl:  .   furosemide (LASIX) 20 MG tablet, TAKE 2 TABLETS BY MOUTH TWO TIMES DAILY, Disp: 360 tablet, Rfl: 3 .  isosorbide mononitrate (IMDUR) 60 MG 24 hr tablet, Take 60 mg by mouth 2 (two) times daily., Disp: , Rfl:  .  metoprolol succinate (TOPROL-XL) 25 MG 24 hr tablet, Take 0.5 tablets (12.5 mg total) by mouth daily., Disp: 45 tablet, Rfl: 3 .  potassium chloride (K-DUR) 10 MEQ tablet, Take 1 tablet (10 mEq total) by mouth daily., Disp: 90 tablet, Rfl: 3 .  ranitidine (ZANTAC) 150 MG tablet, Take 150 mg by mouth as needed., Disp: , Rfl:  .  rOPINIRole (REQUIP) 2 MG tablet, TAKE 1 TABLET BY MOUTH 4  TIMES DAILY, Disp: 360 tablet, Rfl: 3 .  Tamsulosin HCl (FLOMAX) 0.4 MG CAPS, Take 0.4 mg by mouth at bedtime. , Disp: , Rfl:  .  traZODone (DESYREL) 50 MG tablet, Take 50 mg by mouth at bedtime., Disp: , Rfl:  .  warfarin (COUMADIN) 5 MG tablet, TAKE AS DIRECTED BY  COUMADIN CLINIC, Disp: 150 tablet, Rfl: 1  Imaging Review  Lumbosacral Imaging: Lumbar MR wo contrast:  Results for orders placed in visit on 02/14/00  MR Lumbar Spine Wo Contrast   Narrative FINDINGS CLINICAL HISTORY: BACK AND RIGHT LEG PAIN. MRI LUMBAR SPINE WITHOUT CONTRAST: MULTIPLANAR MULTIECHO PULSE SEQUENCES WERE OBTAINED WITHOUT CONTRAST.  DEGENERATIVE CHANGE IS SEEN THROUGHOUT THE LUMBAR SPINE WITH PARTICULAR Conrad SPACE NARROWING AT L2-3 AND L4-5.  MILD DISCOGENIC SCLEROSIS CAN BE SEEN ABOVE AND BELOW L4-5, OTHERWISE, THE MARROW SIGNAL IS HOMOGENEOUS.  THE ALIGNMENT IS SATISFACTORY.  THE CONUS MEDULLARIS IS WELL VISUALIZED AND IS WITHIN NORMAL LIMITS. INDIVIDUAL DISC SPACES ARE EXAMINED AS FOLLOWS: L2-3:  ADVANCED DISC SPACE NARROWING.  FORAMINAL AND EXTRAFORAMINAL DISC PROTRUSION LEFT WITH LEFT L2 NERVE ROOT ENCROACHMENT.  NO SPINAL STENOSIS OR L3 NERVE ROOT ENCROACHMENT.  MILD FACET HYPERTROPHY IS PRESENT. L3-4:  MILD POSTERIOR ELEMENT HYPERTROPHY.  NO STENOSIS OR DISC PROTRUSION. L4-5:  MILD DISC SPACE NARROWING WITH CENTRAL  BULGING ANNULAR FIBERS.  THERE IS A FORAMINAL AND EXTRAFORAMINAL DISC PROTRUSION ON THE RIGHT.  RIGHT L4 NERVE ROOT ENCROACHMENT IS PRESENT. MODERATE POSTERIOR ELEMENT HYPERTROPHY IS PRESENT WITH POSSIBLE LATERAL RECESS ENCROACHMENT AND EQUIVOCAL RIGHT L5 NERVE ROOT ENCROACHMENT. L5-S1:  ADVANCED POSTERIOR ELEMENT HYPERTROPHY.  MILD CENTRAL BULGING ANNULAR FIBERS.  NO NERVE ROOT CUT OFF OR SPINAL STENOSIS.  MILD BIFORAMINAL NARROWING WITHOUT DEFINITE L5 NERVE ROOT ENCROACHMENT. IMPRESSION 1.  FORAMINAL AND EXTRAFORAMINAL DISC PROTRUSION L2-3, LEFT WITH LEFT L2 NERVE ROOT ENCROACHMENT. 2.  SIMILAR FORAMINAL AND EXTRAFORAMINAL DISC PROTRUSION L4-5, RIGHT WITH RIGHT L5 NERVE ROOT ENCROACHMENT. 3.  MULTILEVEL LOWER LUMBAR FACET ARTHROPATHY WORSE AT L4-5. 4.  EQUIVOCAL LATERAL RECESS ENCROACHMENT AT L4-5 RIGHT WORSE THAN LEFT WITH POSSIBLE RIGHT L5 NERVE ROOT COMPROMISE. 5.  BIFORAMINAL NARROWING L5-S1 WITHOUT DEFINITE L5 NERVE ROOT ENCROACHMENT.    Lumbar CT wo contrast:  Results for orders placed during the hospital encounter of 05/07/10  CT Lumbar Spine Wo Contrast   Narrative Clinical Data: Low back pain with bilateral hip pain for several years worse on the right.   CT LUMBAR SPINE WITHOUT CONTRAST   Technique:  Multidetector CT imaging of the lumbar spine was performed without intravenous contrast administration. Multiplanar CT image reconstructions were also generated.   Comparison: 02/24/2008.   Findings: Last fully open disc space is labeled L5-S1.  Present examination incorporates from T12-L1 through the mid sacrum.   Atherosclerotic type changes of the aorta with mild ectasia incompletely evaluated present exam.   T12-L1:  Small Schmorl's node deformity.  Minimal bulge slightly greater to the left.   L1-2:  Small Schmorl's node deformity.  Mild bulge.  Mild facet joint degenerative changes.  Mild spinal stenosis.   L2-3:  Moderate disc degeneration with disc space  narrowing. Bulge/broad-based osteophyte.  Facet joint degenerative changes. Ligamentum flavum hypertrophy.  Mild spinal stenosis.  Left greater than right mild lateral recess stenosis.  Minimal right-sided and mild left-sided foraminal narrowing.   L3-4: Bulge.  Facet joint degenerative changes.  Mild ligament flavum hypertrophy.  Mild to slightly moderate spinal stenosis.   L4-5:  Disc degeneration with disc space narrowing osteophyte greater to the right lateral position.  This causes slight encroachment upon the course of the exiting right L4 nerve root. Facet joint degenerative changes and ligamentum flavum hypertrophy greater on the right.  Moderate right-sided and mild to moderate left-sided spinal stenosis/lateral recess stenosis.   L5-S1:  Disc degeneration, rotation with disc space narrowing and mild retrolisthesis of L5 with moderate bulge/osteophyte.  Moderate bilateral facet joint degenerative changes.  Moderate bilateral lateral recess stenosis and mild spinal stenosis.  Moderate right- sided and mild to moderate left-sided foraminal narrowing.   IMPRESSION: L1-2 mild spinal stenosis.   L2-3 mild spinal stenosis.  Left greater than right mild lateral recess stenosis.   L3-4 mild to slightly moderate spinal stenosis.   L4-5 right lateral osteophyte causes slight encroachment upon the course of the exiting right L4 nerve root.  Moderate right-sided and mild to moderate left-sided spinal stenosis/lateral recess stenosis.   L5-S1 interval clearing of gas containing disc within the right neural foramen.  Moderate bilateral lateral recess stenosis and mild spinal stenosis.  Moderate right-sided and mild to moderate left-sided foraminal narrowing.  Provider: Cleda Mccreedy    Complexity Note: Imaging results reviewed. Results shared with Mr. Chouinard, using Layman's terms.                         ROS  Cardiovascular: High blood pressure, Heart surgery, Heart valve  problems, Heart catheterization, Blood thinners:  Anticoagulant and Needs antibiotics prior to dental procedures Pulmonary or Respiratory: Shortness of breath and Temporary stoppage of breathing during sleep Neurological: No reported neurological signs or symptoms such as seizures, abnormal skin sensations, urinary and/or fecal incontinence, being born with an abnormal open spine and/or a tethered spinal cord Review of Past Neurological Studies: No results found for this or any previous visit. Psychological-Psychiatric: Depressed Gastrointestinal: Reflux or heatburn and Irregular, infrequent bowel movements (Constipation) Genitourinary: No reported renal or genitourinary signs or symptoms such as difficulty voiding or producing urine, peeing blood, non-functioning kidney, kidney stones, difficulty emptying the bladder, difficulty controlling the flow of urine, or chronic kidney disease Hematological: Brusing easily Endocrine: No reported endocrine signs or symptoms such as high or low blood sugar, rapid heart rate due to high thyroid levels, obesity or weight gain due to slow thyroid or thyroid disease Rheumatologic: No reported rheumatological signs and symptoms such as fatigue, joint pain, tenderness, swelling, redness, heat, stiffness, decreased range of motion, with or without associated rash Musculoskeletal: Negative for myasthenia gravis,  muscular dystrophy, multiple sclerosis or malignant hyperthermia Work History: Retired  Allergies  Mr. Mikami is allergic to levofloxacin and sulfa antibiotics.  Laboratory Chemistry  Inflammation Markers (CRP: Acute Phase) (ESR: Chronic Phase) Lab Results  Component Value Date   CRP <0.8 02/05/2018   ESRSEDRATE 7 02/05/2018                         Rheumatology Markers No results found for: RF, ANA, LABURIC, URICUR, LYMEIGGIGMAB, LYMEABIGMQN, HLAB27                      Renal Function Markers Lab Results  Component Value Date   BUN 23 (H)  08/16/2017   CREATININE 0.99 08/16/2017   BCR 20 03/20/2017   GFRAA >60 08/16/2017   GFRNONAA >60 08/16/2017                             Hepatic Function Markers Lab Results  Component Value Date   AST 23 02/22/2017   ALT 15 (L) 02/22/2017   ALBUMIN 4.3 02/22/2017   ALKPHOS 67 02/22/2017   LIPASE 116 07/28/2014                        Electrolytes Lab Results  Component Value Date   NA 131 (L) 08/16/2017   K 4.6 08/16/2017   CL 97 (L) 08/16/2017   CALCIUM 8.9 08/16/2017   MG 2.0 04/17/2015                        Neuropathy Markers Lab Results  Component Value Date   HGBA1C 5.3 10/31/2014                        Bone Pathology Markers No results found for: VD25OH, VD125OH2TOT, GY5638LH7, DS2876OT1, 25OHVITD1, 25OHVITD2, 25OHVITD3, TESTOFREE, TESTOSTERONE                       Coagulation Parameters Lab Results  Component Value Date   INR 3.1 (A) 07/06/2018   LABPROT 22.7 (H) 08/07/2015   APTT 40 (H) 04/02/2015   PLT 192 02/05/2018                        Cardiovascular Markers Lab Results  Component Value Date   BNP 574.0 (H) 07/31/2015   CKTOTAL 71 08/31/2010   CKMB 1.6 08/31/2010   TROPONINI <0.03 08/16/2017   HGB 13.2 02/05/2018   HCT 39.5 (L) 02/05/2018                         CA Markers No results found for: CEA, CA125, LABCA2                      Note: Lab results reviewed.  [place]  Drug: Mr. Calvey  reports that he does not use drugs. Alcohol:  reports that he drinks about 3.0 standard drinks of alcohol per week. Tobacco:  reports that he quit smoking about 40 years ago. His smoking use included cigarettes. He has never used smokeless tobacco. Medical:  has a past medical history of Anxiety (10/11), Aortic stenosis, Bradycardia, C. difficile colitis, Carotid bruit, Chronic diastolic CHF (congestive heart failure) ([place]), Coronary artery disease, Decreased hearing, Depression, Gastric ulcer, GERD (gastroesophageal reflux disease),  Hypertension, RLS  (restless legs syndrome) (08/23/2015), S/P AVR, and SOB (shortness of breath) (10/11). Family: family history includes Diabetes type II in his mother; Heart attack in his mother; Heart disease in his father and mother; Hypertension in his father, mother, and unknown relative; Stroke in his brother, brother, and father.  Past Surgical History:  Procedure Laterality Date  . CARDIAC CATHETERIZATION    . ESOPHAGOGASTRODUODENOSCOPY N/A 04/05/2015   Procedure: ESOPHAGOGASTRODUODENOSCOPY (EGD);  Surgeon: Manya Silvas, MD;  Location: [facility] ENDOSCOPY;  Service: Endoscopy;  Laterality: N/A;  . ESOPHAGOGASTRODUODENOSCOPY N/A 04/17/2015   Procedure: ESOPHAGOGASTRODUODENOSCOPY (EGD);  Surgeon: Manya Silvas, MD;  Location: [facility] ENDOSCOPY;  Service: Endoscopy;  Laterality: N/A;  . ESOPHAGOGASTRODUODENOSCOPY N/A 08/02/2015   Procedure: ESOPHAGOGASTRODUODENOSCOPY (EGD);  Surgeon: Hulen Luster, MD;  Location: [facility] ENDOSCOPY;  Service: Endoscopy;  Laterality: N/A;  . HERNIA REPAIR    . JOINT REPLACEMENT    . TOTAL HIP ARTHROPLASTY    . VALVE REPLACEMENT  1/02   Aortic; echo 3/09 valve working well; echo 10/11 working well; put on Coumadin   Active Ambulatory Problems    Diagnosis Date Noted  . GERD (gastroesophageal reflux disease)   . Bradycardia   . Depression   . Anxiety   . Hypertension   . Decreased hearing   . Coronary artery disease   . Aortic stenosis   . Warfarin anticoagulation   . Carotid bruit   . Anemia 04/02/2015  . Blood loss anemia   . S/P AVR (aortic valve replacement)   . C. difficile colitis 05/26/2015  . RLS (restless legs syndrome) 08/23/2015  . Bilateral leg edema 09/25/2015  . Encounter for anticoagulation discussion and counseling 02/24/2017  . Chronic hyponatremia 08/12/2017  . Chronic diastolic CHF (congestive heart failure) ([place]) 09/24/2017  . Lymphedema 09/24/2017  . Obstructive sleep apnea 10/23/2017   Resolved Ambulatory Problems    Diagnosis Date Noted  . SOB  (shortness of breath)   . S/P AVR   . Ejection fraction   . Bleeding gastrointestinal   . GI bleed 04/16/2015  . Bleeding gastric ulcer 05/26/2015  . MRSA bacteremia 05/26/2015  . GIB (gastrointestinal bleeding) 07/31/2015  . Snoring 09/24/2017   Past Medical History:  Diagnosis Date  . Gastric ulcer    Constitutional Exam  General appearance: Well nourished, well developed, and well hydrated. In no apparent acute distress Vitals:   07/13/18 1138  BP: (!) 162/82  Pulse: 68  Resp: 16  Temp: (!) 97.5 F (36.4 C)  TempSrc: Oral  SpO2: 100%  Weight: 207 lb (93.9 kg)  Height: _0  (1.803 m)   BMI Assessment: Estimated body mass index is 28.87 kg/m as calculated from the following:   Height as of this encounter: _1  (1.803 m).   Weight as of this encounter: 207 lb (93.9 kg).  BMI interpretation table: BMI level Category Range association with higher incidence of chronic pain  <18 kg/m2 Underweight   18.5-24.9 kg/m2 Ideal body weight   25-29.9 kg/m2 Overweight Increased incidence by 20%  30-34.9 kg/m2 Obese (Class I) Increased incidence by 68%  35-39.9 kg/m2 Severe obesity (Class II) Increased incidence by 136%  >40 kg/m2 Extreme obesity (Class III) Increased incidence by 254%   Patient's current BMI Ideal Body weight  Body mass index is 28.87 kg/m. Ideal body weight: 75.3 kg (166 lb 0.1 oz) Adjusted ideal body weight: 82.7 kg (182 lb 6.5 oz)   BMI Readings from Last 4 Encounters:  07/13/18 28.87 kg/m  03/02/18  28.87 kg/m  02/17/18 29.57 kg/m  01/09/18 29.08 kg/m   Wt Readings from Last 4 Encounters:  07/13/18 207 lb (93.9 kg)  03/02/18 207 lb (93.9 kg)  02/17/18 212 lb (96.2 kg)  01/09/18 208 lb 8 oz (94.6 kg)  Psych/Mental status: Alert, oriented x 3 (person, place, & time)       Eyes: PERLA Respiratory: No evidence of acute respiratory distress  Lumbar Spine Area Exam  Skin & Axial Inspection: No masses, redness, or swelling Alignment:  Symmetrical Functional ROM: Decreased ROM       Stability: No instability detected Muscle Tone/Strength: Functionally intact. No obvious neuro-muscular anomalies detected. Sensory (Neurological): Musculoskeletal pain pattern Palpation: No palpable anomalies       Provocative Tests: Hyperextension/rotation test: deferred today       Lumbar quadrant test (Kemp's test): deferred today       Lateral bending test: deferred today       Patrick's Maneuver: deferred today                   FABER test: deferred today                   S-I anterior distraction/compression test: deferred today         S-I lateral compression test: deferred today         S-I Thigh-thrust test: deferred today         S-I Gaenslen's test: deferred today          Gait & Posture Assessment  Ambulation: Patient ambulates using a wheel chair Gait: Significantly limited. Dependent on assistive device to ambulate Posture: Thoracic kyphosis   Lower Extremity Exam    Side: Right lower extremity  Side: Left lower extremity  Stability: No instability observed          Stability: No instability observed          Skin & Extremity Inspection: Skin color, temperature, and hair growth are WNL. No peripheral edema or cyanosis. No masses, redness, swelling, asymmetry, or associated skin lesions. No contractures.  Skin & Extremity Inspection: Skin color, temperature, and hair growth are WNL. No peripheral edema or cyanosis. No masses, redness, swelling, asymmetry, or associated skin lesions. No contractures.  Functional ROM: Unrestricted ROM                  Functional ROM: Decreased ROM for hip and knee joints          Muscle Tone/Strength: Functionally intact. No obvious neuro-muscular anomalies detected.  Muscle Tone/Strength: Functionally intact. No obvious neuro-muscular anomalies detected.  Sensory (Neurological): Unimpaired  Sensory (Neurological): Arthropathic arthralgia  Palpation: No palpable anomalies  Palpation: Complains  of area being tender to palpation    Assessment  Primary Diagnosis & Pertinent Problem List: The primary encounter diagnosis was Primary osteoarthritis of left knee. Diagnoses of Chronic pain of left knee, S/P AVR (aortic valve replacement), Warfarin anticoagulation, and Chronic pain syndrome were also pertinent to this visit.  Visit Diagnosis (New problems to examiner): 1. Primary osteoarthritis of left knee   2. Chronic pain of left knee   3. S/P AVR (aortic valve replacement)   4. Warfarin anticoagulation   5. Chronic pain syndrome    84 year old male who was referred from Dr. Rudene Christians for a cooled radiofrequency ablation of left knee genicular nerves.  Of note patient has a history of left knee osteoarthritis.  He has been ambulating with a walker.  Patient has tried intra-articular knee  steroid injections in the past which were only moderately effective.  Of note patient does have a history of an aortic valve replacement and is anticoagulated with warfarin.  Patient's target INR is between 2 and 3.  In regards to treatment plan, we had a discussion about left knee genicular nerve block followed by cooled radiofrequency ablation.  Risks and benefits of the procedure were discussed and patient would like to proceed.  Of note, we will need cardiac clearance for the patient to stop his Coumadin 4 to 5 days prior to scheduled procedure to minimize the risk of hematoma/hemorrhage.  We will contact Dr. Rockey Situ with cardiology to see if it is safe for the patient to stop his Coumadin 4 to 5 days prior to scheduled procedure.   Plan of Care  Treatment Plan:  1.)  Left knee genicular nerve block pending cardiac clearance to stop Coumadin anticoagulation 4 to 5 days prior to scheduled procedure. 2.) No ambulatory pharmacotherapy. Patient instructed to call referring physician for medication management. In the event that the referring physician is interested in also having [place] take over the patient's  chronic analgesic medication management, a referral specific for this will be required.Treatment will be limited to interventional therapies only. If long-term chronic pain management is desired, please arrange for a referral specific for this.  Pharmacotherapy (Medications Ordered): No orders of the defined types were placed in this encounter.  New Prescriptions   No medications on file    Procedure Orders     GENICULAR NERVE BLOCK   Requested PM Follow-up: Return in about 3 weeks (around 08/03/2018) for Blood Thinner Protocol, Procedure.  Future Appointments  Date Time Provider [place]  08/03/2018 10:45 AM Gillis Santa, MD [facility] None  08/03/2018 11:15 AM [facility]  09/02/2018  1:40 PM Minna Merritts, MD [facility]  09/21/2018  9:45 AM Gardiner Barefoot, DPM [facility]  10/19/2018 11:30 AM Ward Givens, NP [facility] None   Primary Care Physician: Baxter Hire, MD Location: [facility] Outpatient Pain Management Facility Note by: Gillis Santa, MD Date: 07/13/2018; Time: 3:45 PM

## 2018-07-13 NOTE — Progress Notes (Signed)
Safety precautions to be maintained throughout the outpatient stay will include: orient to surroundings, keep bed in low position, maintain call bell within reach at all times, provide assistance with transfer out of bed and ambulation.  

## 2018-07-13 NOTE — Patient Instructions (Addendum)
_Stop blood thinner 5 days prior pending on approval. ___________________________________________________________________________________________  Preparing for Procedure with Sedation  Instructions: . Oral Intake: Do not eat or drink anything for at least 8 hours prior to your procedure. . Transportation: Public transportation is not allowed. Bring an adult driver. The driver must be physically present in our waiting room before any procedure can be started. Marland Kitchen. Physical Assistance: Bring an adult physically capable of assisting you, in the event you need help. This adult should keep you company at home for at least 6 hours after the procedure. . Blood Pressure Medicine: Take your blood pressure medicine with a sip of water the morning of the procedure. . Blood thinners: Notify our staff if you are taking any blood thinners. Depending on which one you take, there will be specific instructions on how and when to stop it. . Diabetics on insulin: Notify the staff so that you can be scheduled 1st case in the morning. If your diabetes requires high dose insulin, take only  of your normal insulin dose the morning of the procedure and notify the staff that you have done so. . Preventing infections: Shower with an antibacterial soap the morning of your procedure. . Build-up your immune system: Take 1000 mg of Vitamin C with every meal (3 times a day) the day prior to your procedure. Marland Kitchen. Antibiotics: Inform the staff if you have a condition or reason that requires you to take antibiotics before dental procedures. . Pregnancy: If you are pregnant, call and cancel the procedure. . Sickness: If you have a cold, fever, or any active infections, call and cancel the procedure. . Arrival: You must be in the facility at least 30 minutes prior to your scheduled procedure. . Children: Do not bring children with you. . Dress appropriately: Bring dark clothing that you would not mind if they get stained. . Valuables: Do  not bring any jewelry or valuables.  Procedure appointments are reserved for interventional treatments only. Marland Kitchen. No Prescription Refills. . No medication changes will be discussed during procedure appointments. . No disability issues will be discussed.  Reasons to call and reschedule or cancel your procedure: (Following these recommendations will minimize the risk of a serious complication.) . Surgeries: Avoid having procedures within 2 weeks of any surgery. (Avoid for 2 weeks before or after any surgery). . Flu Shots: Avoid having procedures within 2 weeks of a flu shots or . (Avoid for 2 weeks before or after immunizations). . Barium: Avoid having a procedure within 7-10 days after having had a radiological study involving the use of radiological contrast. (Myelograms, Barium swallow or enema study). . Heart attacks: Avoid any elective procedures or surgeries for the initial 6 months after a "Myocardial Infarction" (Heart Attack). . Blood thinners: It is imperative that you stop these medications before procedures. Let us know if you if you take any blood thinner.  . Infection: Avoid procedures during or within two weeks of an infection (including chest colds or gastrointestinal problems). Symptoms associated with infections include: Localized redness, fever, chills, night sweats or profuse sweating, burning sensation when voiding, cough, congestion, stuffiness, runny nose, sore throat, diarrhea, nausea, vomiting, cold or Flu symptoms, recent or current infections. It is specially important if the infection is over the area that we intend to treat. Marland Kitchen. Heart and lung problems: Symptoms that may suggest an active cardiopulmonary problem include: cough, chest pain, breathing difficulties or shortness of breath, dizziness, ankle swelling, uncontrolled high or unusually low blood pressure, and/or palpitations.  If you are experiencing any of these symptoms, cancel your procedure and contact your primary care  physician for an evaluation.  Remember:  Regular Business hours are:  Monday to Thursday 8:00 AM to 4:00 PM  Provider's Schedule: Delano MetzFrancisco Naveira, MD:  Procedure days: Tuesday and Thursday 7:30 AM to 4:00 PM  Edward JollyBilal Lateef, MD:  Procedure days: Monday and Wednesday 7:30 AM to 4:00 PM ____________________________________________________________________________________________    Selective Nerve Root Block Patient Information  Description: Specific nerve roots exit the spinal canal and these nerves can be compressed and inflamed by a bulging disc and bone spurs.  By injecting steroids on the nerve root, we can potentially decrease the inflammation surrounding these nerves, which often leads to decreased pain.  Also, by injecting local anesthesia on the nerve root, this can provide us helpful information to give to your referring doctor if it decreases your pain.  Selective nerve root blocks can be done along the spine from the neck to the low back depending on the location of your pain.   After numbing the skin with local anesthesia, a small needle is passed to the nerve root and the position of the needle is verified using x-ray pictures.  After the needle is in correct position, we then deposit the medication.  You may experience a pressure sensation while this is being done.  The entire block usually lasts less than 15 minutes.  Conditions that may be treated with selective nerve root blocks:  Low back and leg pain  Spinal stenosis  Diagnostic block prior to potential surgery  Neck and arm pain  Post laminectomy syndrome  Preparation for the injection:  1. Do not eat any solid food or dairy products within 8 hours of your appointment. 2. You may drink clear liquids up to 3 hours before an appointment.  Clear liquids include water, black coffee, juice or soda.  No milk or cream please. 3. You may take your regular medications, including pain medications, with a sip of water  before your appointment.  Diabetics should hold regular insulin (if taken separately) and take 1/2 normal NPH dose the morning of the procedure.  Carry some sugar containing items with you to your appointment. 4. A driver must accompany you and be prepared to drive you home after your procedure. 5. Bring all your current medications with you. 6. An IV may be inserted and sedation may be given at the discretion of the physician. 7. A blood pressure cuff, EKG, and other monitors will often be applied during the procedure.  Some patients may need to have extra oxygen administered for a short period. 8. You will be asked to provide medical information, including allergies, prior to the procedure.  We must know immediately if you are taking blood  Thinners (like Coumadin) or if you are allergic to IV iodine contrast (dye).  Possible side-effects: All are usually temporary  Bleeding from needle site  Light headedness  Numbness and tingling  Decreased blood pressure  Weakness in arms/legs  Pressure sensation in back/neck  Pain at injection site (several days)  Possible complications: All are extremely rare  Infection  Nerve injury  Spinal headache (a headache wore with upright position)  Call if you experience:  Fever/chills associated with headache or increased back/neck pain  Headache worsened by an upright position  New onset weakness or numbness of an extremity below the injection site  Hives or difficulty breathing (go to the emergency room)  Inflammation or drainage at the injection  site(s)  Severe back/neck pain greater than usual  New symptoms which are concerning to you  Please note:  Although the local anesthetic injected can often make your back or neck feel good for several hours after the injection the pain will likely return.  It takes 3-5 days for steroids to work on the nerve root. You may not notice any pain relief for at least one week.  If  effective, we will often do a series of 3 injections spaced 3-6 weeks apart to maximally decrease your pain.    If you have any questions, please call [phone] [facility]

## 2018-07-14 ENCOUNTER — Telehealth: Payer: Self-pay | Admitting: Cardiovascular Disease

## 2018-07-14 DIAGNOSIS — Z8679 Personal history of other diseases of the circulatory system: Secondary | ICD-10-CM

## 2018-07-14 NOTE — Telephone Encounter (Signed)
He was in atrial flutter on last clinic visit Also with mechanical valve Do we want lovenox?

## 2018-07-14 NOTE — Telephone Encounter (Signed)
° °  [facility] Pre-operative Risk Assessment    Request for surgical clearance:  1. What type of surgery is being performed? Knee Genicular Nerve Block followed by Cooled Radiofrequency ablation   2. When is this surgery scheduled? Not listed  3. What type of clearance is required (medical clearance vs. Pharmacy clearance to hold med vs. Both)? Pharmacy   4. Are there any medications that need to be held prior to surgery and how long? Coumadin hold for 5 days or Lovenox Bridge   5. Practice name and name of physician performing surgery? [facility]   6. What is your office phone number [phone]    7.   What is your office fax number [phone]  8.   Anesthesia type (None, local, MAC, general) ? Not listed

## 2018-07-14 NOTE — Telephone Encounter (Signed)
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 He does have a history of GI bleed.   Procedure: Knee nerve block and ablation Date of procedure: TBD  Per office protocol, patient can hold warfarin for 5 days prior to procedure.   Patient will not need bridging with Lovenox (enoxaparin) around procedure.

## 2018-07-14 NOTE — Telephone Encounter (Signed)
Pending clearance from Dr. Mariah MillingGollan.

## 2018-07-15 DIAGNOSIS — Z8679 Personal history of other diseases of the circulatory system: Secondary | ICD-10-CM | POA: Insufficient documentation

## 2018-07-15 NOTE — Telephone Encounter (Signed)
Yes with new flutter we would need lovenox bridging with mechanical valve. I will add this to his chart. I have faxed updated clearance to office for procedure.   Spoke with patient and procedure scheduled for 08/03/18. Will have him coming in on 07/29/18 for teaching of lovenox. He is aware to not take his coumadin that day.

## 2018-07-16 ENCOUNTER — Telehealth: Payer: Self-pay

## 2018-07-16 NOTE — Telephone Encounter (Signed)
Voicemail left for pt explaining the need for him to stp taking Lovenox 24 hours prior to 08/03/2018 procedure.

## 2018-07-16 NOTE — Telephone Encounter (Signed)
Spoke with pt confirming that he has spoken with Dr. Mariah MillingGollan regarding stopping coumadin and doing a lovenox bridge prior to upcoming procedure here at the pain clinic.

## 2018-07-29 ENCOUNTER — Ambulatory Visit (INDEPENDENT_AMBULATORY_CARE_PROVIDER_SITE_OTHER): Payer: Medicare Other

## 2018-07-29 DIAGNOSIS — Z952 Presence of prosthetic heart valve: Secondary | ICD-10-CM | POA: Diagnosis not present

## 2018-07-29 DIAGNOSIS — Z7901 Long term (current) use of anticoagulants: Secondary | ICD-10-CM

## 2018-07-29 DIAGNOSIS — I350 Nonrheumatic aortic (valve) stenosis: Secondary | ICD-10-CM | POA: Diagnosis not present

## 2018-07-29 LAB — POCT INR: INR: 3.4 — AB (ref 2.0–3.0)

## 2018-07-29 MED ORDER — ENOXAPARIN SODIUM 100 MG/ML ~~LOC~~ SOLN: 100.0000 mg | Freq: Two times a day (BID) | SUBCUTANEOUS | 1 refills | Status: DC

## 2018-07-29 NOTE — Patient Instructions (Signed)
9/3: Last dose of Coumadin.  9/4: No Coumadin or Lovenox.  9/5: Inject Lovenox 100 mg in the fatty abdominal tissue at least 2 inches from the belly button twice a day about 12 hours apart, 8am and 8pm rotate sites. No Coumadin.  9/6: Inject Lovenox in the fatty tissue every 12 hours, 8am and 8pm. No Coumadin.  9/7: Inject Lovenox in the fatty tissue every 12 hours, 8am and 8pm. No Coumadin.  9/8: Inject Lovenox in the fatty tissue in the morning at 8 am (No PM dose). No Coumadin.  9/9: Procedure Day - No Lovenox - Resume Coumadin in the evening or as directed by doctor (take an extra half tablet with usual dose for 2 days then resume normal dose).  9/10: Resume Lovenox inject in the fatty tissue every 12 hours and take Coumadin.  9/11: Inject Lovenox in the fatty tissue every 12 hours and take Coumadin.  9/12: Inject Lovenox in the fatty tissue every 12 hours and take Coumadin.  9/13: Inject Lovenox in the fatty tissue every 12 hours and take Coumadin.  9/14: Inject Lovenox in the fatty tissue every 12 hours and take Coumadin.  9/15: Inject Lovenox in the fatty tissue every 12 hours and take Coumadin  9:16: Coumadin appt to check INR.

## 2018-08-03 ENCOUNTER — Ambulatory Visit (HOSPITAL_BASED_OUTPATIENT_CLINIC_OR_DEPARTMENT_OTHER): Payer: Medicare Other | Admitting: Student in an Organized Health Care Education/Training Program

## 2018-08-03 ENCOUNTER — Ambulatory Visit
Admission: RE | Admit: 2018-08-03 | Discharge: 2018-08-03 | Disposition: A | Discharge status: Home / Self Care | Payer: Medicare Other | Source: Ambulatory Visit | Attending: Student in an Organized Health Care Education/Training Program | Admitting: Student in an Organized Health Care Education/Training Program

## 2018-08-03 ENCOUNTER — Other Ambulatory Visit: Payer: Self-pay

## 2018-08-03 VITALS — BP 190/90 | HR 65 | Temp 97.0°F | Resp 14 | Ht 71.0 in | Wt 205.0 lb

## 2018-08-03 DIAGNOSIS — Z952 Presence of prosthetic heart valve: Secondary | ICD-10-CM | POA: Diagnosis not present

## 2018-08-03 DIAGNOSIS — Z881 Allergy status to other antibiotic agents status: Secondary | ICD-10-CM | POA: Diagnosis not present

## 2018-08-03 DIAGNOSIS — Z7901 Long term (current) use of anticoagulants: Secondary | ICD-10-CM | POA: Insufficient documentation

## 2018-08-03 DIAGNOSIS — Z96649 Presence of unspecified artificial hip joint: Secondary | ICD-10-CM | POA: Diagnosis not present

## 2018-08-03 DIAGNOSIS — Z79899 Other long term (current) drug therapy: Secondary | ICD-10-CM | POA: Insufficient documentation

## 2018-08-03 DIAGNOSIS — G8929 Other chronic pain: Secondary | ICD-10-CM

## 2018-08-03 DIAGNOSIS — M25562 Pain in left knee: Secondary | ICD-10-CM

## 2018-08-03 DIAGNOSIS — F419 Anxiety disorder, unspecified: Secondary | ICD-10-CM | POA: Insufficient documentation

## 2018-08-03 DIAGNOSIS — M1712 Unilateral primary osteoarthritis, left knee: Secondary | ICD-10-CM | POA: Insufficient documentation

## 2018-08-03 DIAGNOSIS — Z882 Allergy status to sulfonamides status: Secondary | ICD-10-CM | POA: Insufficient documentation

## 2018-08-03 MED ORDER — LIDOCAINE HCL 2 % IJ SOLN
20.0000 mL | Freq: Once | INTRAMUSCULAR | Status: AC
  Administered 2018-08-03: 400 mg

## 2018-08-03 MED ORDER — ROPIVACAINE HCL 2 MG/ML IJ SOLN
INTRAMUSCULAR | Status: AC
  Filled 2018-08-03: qty 10

## 2018-08-03 MED ORDER — DEXAMETHASONE SODIUM PHOSPHATE 10 MG/ML IJ SOLN
INTRAMUSCULAR | Status: AC
Start: 2018-08-03 — End: 2018-08-03
  Filled 2018-08-03: qty 1

## 2018-08-03 MED ORDER — DEXAMETHASONE SODIUM PHOSPHATE 10 MG/ML IJ SOLN
10.0000 mg | Freq: Once | INTRAMUSCULAR | Status: AC
  Administered 2018-08-03: 10 mg

## 2018-08-03 MED ORDER — LACTATED RINGERS IV SOLN
1000.0000 mL | Freq: Once | INTRAVENOUS | Status: AC
  Administered 2018-08-03: 1000 mL via INTRAVENOUS

## 2018-08-03 MED ORDER — ROPIVACAINE HCL 2 MG/ML IJ SOLN
10.0000 mL | Freq: Once | INTRAMUSCULAR | Status: AC
  Administered 2018-08-03: 10 mL

## 2018-08-03 MED ORDER — LIDOCAINE HCL 2 % IJ SOLN
INTRAMUSCULAR | Status: AC
  Filled 2018-08-03: qty 20

## 2018-08-03 MED ORDER — FENTANYL CITRATE (PF) 100 MCG/2ML IJ SOLN
INTRAMUSCULAR | Status: AC
  Filled 2018-08-03: qty 2

## 2018-08-03 MED ORDER — FENTANYL CITRATE (PF) 100 MCG/2ML IJ SOLN
25.0000 ug | INTRAMUSCULAR | Status: DC | PRN
  Administered 2018-08-03: 12.5 ug via INTRAVENOUS

## 2018-08-03 NOTE — Progress Notes (Signed)
Patient's Name: [name]  MRN: [id number]  Referring Provider: Gracelyn Nurse, MD  DOB: February 21, 1931  PCP: Gracelyn Nurse, MD  DOS: 08/03/2018  Note by: Edward Jolly, MD  Service setting: Ambulatory outpatient  Specialty: Interventional Pain Management  Patient type: Established  Location: [facility] (AMB) Pain Management Facility  Visit type: Interventional Procedure   Primary Reason for Visit: Interventional Pain Management Treatment. CC: Knee Pain (left)  Procedure:          Anesthesia, Analgesia, Anxiolysis:  Type: Genicular Nerves Block (Superior-lateral, Superior-medial, and Inferior-medial Genicular Nerves)          CPT: 64450      Primary Purpose: Diagnostic Region: Lateral, Anterior, and Medial aspects of the knee joint, above and below the knee joint proper. Level: Superior and inferior to the knee joint. Target Area: For Genicular Nerve block(s), the targets are: the superior-lateral genicular nerve, located in the lateral distal portion of the femoral shaft as it curves to form the lateral epicondyle, in the region of the distal femoral metaphysis; the superior-medial genicular nerve, located in the medial distal portion of the femoral shaft as it curves to form the medial epicondyle; and the inferior-medial genicular nerve, located in the medial, proximal portion of the tibial shaft, as it curves to form the medial epicondyle, in the region of the proximal tibial metaphysis. Approach: Anterior, percutaneous, ipsilateral approach. Laterality: Left knee Position: Modified Fowler's position with pillows under the targeted knee(s).  Type: Moderate (Conscious) Sedation combined with Local Anesthesia Indication(s): Analgesia and Anxiety Route: Intravenous (IV) IV Access: Secured Sedation: Meaningful verbal contact was maintained at all times during the procedure  Local Anesthetic: Lidocaine 1-2%   Indications: 1. Primary osteoarthritis of left knee   2. Chronic pain of left  knee    Pain Score: Pre-procedure: 5 /10 Post-procedure: 0-No pain/10  Pt here for LEFT knee genicular nerve block. Obtained cardiac clearance to stop Coumadin and bridge with Lovenox. Last dose of Lovenox yesterday at 9 am. No Lovenox today.   Pre-op Assessment:  [name] is a 84 y.o. (year old), male patient, seen today for interventional treatment. He  has a past surgical history that includes Valve replacement (1/02); Cardiac catheterization; Joint replacement; Hernia repair; Esophagogastroduodenoscopy (N/A, 04/05/2015); Esophagogastroduodenoscopy (N/A, 04/17/2015); Esophagogastroduodenoscopy (N/A, 08/02/2015); and Total hip arthroplasty. [name] has a current medication list which includes the following prescription(s): cetirizine hcl, citalopram, clonidine, doxazosin, enoxaparin, finasteride, furosemide, isosorbide mononitrate, metoprolol succinate, potassium chloride, ranitidine, ropinirole, tamsulosin, trazodone, and warfarin, and the following Facility-Administered Medications: fentanyl. His primarily concern today is the Knee Pain (left)  Initial Vital Signs:  Pulse/HCG Rate: 68  Temp: 97.9 F (36.6 C) Resp: 14 BP: (!) 187/92 SpO2: 100 %  BMI: Estimated body mass index is 28.59 kg/m as calculated from the following:   Height as of this encounter: 5\' 11"  (1.803 m).   Weight as of this encounter: 205 lb (93 kg).  Risk Assessment: Allergies: Reviewed. He is allergic to levofloxacin and sulfa antibiotics.  Allergy Precautions: None required Coagulopathies: Reviewed. None identified.  Blood-thinner therapy: None at this time Active Infection(s): Reviewed. None identified. [name] is afebrile  Site Confirmation: [name] was asked to confirm the procedure and laterality before marking the site Procedure checklist: Completed Consent: Before the procedure and under the influence of no sedative(s), amnesic(s), or anxiolytics, the patient was informed of the treatment options,  risks and possible complications. To fulfill our ethical and legal obligations, as recommended by the American  Medical Association's Code of Ethics, I have informed the patient of my clinical impression; the nature and purpose of the treatment or procedure; the risks, benefits, and possible complications of the intervention; the alternatives, including doing nothing; the risk(s) and benefit(s) of the alternative treatment(s) or procedure(s); and the risk(s) and benefit(s) of doing nothing. The patient was provided information about the general risks and possible complications associated with the procedure. These may include, but are not limited to: failure to achieve desired goals, infection, bleeding, organ or nerve damage, allergic reactions, paralysis, and death. In addition, the patient was informed of those risks and complications associated to the procedure, such as failure to decrease pain; infection; bleeding; organ or nerve damage with subsequent damage to sensory, motor, and/or autonomic systems, resulting in permanent pain, numbness, and/or weakness of one or several areas of the body; allergic reactions; (i.e.: anaphylactic reaction); and/or death. Furthermore, the patient was informed of those risks and complications associated with the medications. These include, but are not limited to: allergic reactions (i.e.: anaphylactic or anaphylactoid reaction(s)); adrenal axis suppression; blood sugar elevation that in diabetics may result in ketoacidosis or comma; water retention that in patients with history of congestive heart failure may result in shortness of breath, pulmonary edema, and decompensation with resultant heart failure; weight gain; swelling or edema; medication-induced neural toxicity; particulate matter embolism and blood vessel occlusion with resultant organ, and/or nervous system infarction; and/or aseptic necrosis of one or more joints. Finally, the patient was informed that Medicine  is not an exact science; therefore, there is also the possibility of unforeseen or unpredictable risks and/or possible complications that may result in a catastrophic outcome. The patient indicated having understood very clearly. We have given the patient no guarantees and we have made no promises. Enough time was given to the patient to ask questions, all of which were answered to the patient's satisfaction. Mr. Janet has indicated that he wanted to continue with the procedure. Attestation: I, the ordering provider, attest that I have discussed with the patient the benefits, risks, side-effects, alternatives, likelihood of achieving goals, and potential problems during recovery for the procedure that I have provided informed consent. Date  Time: 08/03/2018 10:25 AM  Pre-Procedure Preparation:  Monitoring: As per clinic protocol. Respiration, ETCO2, SpO2, BP, heart rate and rhythm monitor placed and checked for adequate function Safety Precautions: Patient was assessed for positional comfort and pressure points before starting the procedure. Time-out: I initiated and conducted the "Time-out" before starting the procedure, as per protocol. The patient was asked to participate by confirming the accuracy of the "Time Out" information. Verification of the correct person, site, and procedure were performed and confirmed by me, the nursing staff, and the patient. "Time-out" conducted as per Joint Commission's Universal Protocol (UP.01.01.01). Time: 1145  Description of Procedure:          Area Prepped: Entire knee area, from mid-thigh to mid-shin, lateral, anterior, and medial aspects. Prepping solution: ChloraPrep (2% chlorhexidine gluconate and 70% isopropyl alcohol) Safety Precautions: Aspiration looking for blood return was conducted prior to all injections. At no point did we inject any substances, as a needle was being advanced. No attempts were made at seeking any paresthesias. Safe injection practices  and needle disposal techniques used. Medications properly checked for expiration dates. SDV (single dose vial) medications used. Latex Allergy precautions taken.   Description of the Procedure: Protocol guidelines were followed. The patient was placed in position over the procedure table. The target area was identified and  the area prepped in the usual manner. Skin & deeper tissues infiltrated with local anesthetic. Appropriate amount of time allowed to pass for local anesthetics to take effect. The procedure needles were then advanced to the target area. Proper needle placement secured. Negative aspiration confirmed. Solution injected in intermittent fashion, asking for systemic symptoms every 0.5cc of injectate. The needles were then removed and the area cleansed, making sure to leave some of the prepping solution back to take advantage of its long term bactericidal properties.  Vitals:   08/03/18 1155 08/03/18 1205 08/03/18 1215 08/03/18 1225  BP: (!) 183/85 (!) 205/84 (!) 173/82 (!) 190/90  Pulse: 72 66 69 65  Resp: 13 10 (!) 9 14  Temp:  (!) 97 F (36.1 C)    TempSrc:  Temporal    SpO2: 97% 100% 100% 99%  Weight:      Height:        Start Time: 1145 hrs. End Time: 1154 hrs. Materials:  Needle(s) Type: Regular needle Gauge: 22G Length: 3.5-in Medication(s): Please see orders for medications and dosing details. 6 cc solution made a 5 cc of 0.2% ropivacaine, 1 cc of Decadron 10 mg/cc.  2 cc injected at each level above for the left knee. Imaging Guidance (Non-Spinal):          Type of Imaging Technique: Fluoroscopy Guidance (Non-Spinal) Indication(s): Assistance in needle guidance and placement for procedures requiring needle placement in or near specific anatomical locations not easily accessible without such assistance. Exposure Time: Please see nurses notes. Contrast: Before injecting any contrast, we confirmed that the patient did not have an allergy to iodine, shellfish, or  radiological contrast. Once satisfactory needle placement was completed at the desired level, radiological contrast was injected. Contrast injected under live fluoroscopy. No contrast complications. See chart for type and volume of contrast used. Fluoroscopic Guidance: I was personally present during the use of fluoroscopy. "Tunnel Vision Technique" used to obtain the best possible view of the target area. Parallax error corrected before commencing the procedure. "Direction-depth-direction" technique used to introduce the needle under continuous pulsed fluoroscopy. Once target was reached, antero-posterior, oblique, and lateral fluoroscopic projection used confirm needle placement in all planes. Images permanently stored in EMR. Interpretation: I personally interpreted the imaging intraoperatively. Adequate needle placement confirmed in multiple planes. Appropriate spread of contrast into desired area was observed. No evidence of afferent or efferent intravascular uptake. Permanent images saved into the patient's record.  Antibiotic Prophylaxis:   Anti-infectives (From admission, onward)   None     Indication(s): None identified  Post-operative Assessment:  Post-procedure Vital Signs:  Pulse/HCG Rate: 65  Temp: (!) 97 F (36.1 C) Resp: 14 BP: (!) 190/90 SpO2: 99 %  EBL: None  Complications: No immediate post-treatment complications observed by team, or reported by patient.  Note: The patient tolerated the entire procedure well. A repeat set of vitals were taken after the procedure and the patient was kept under observation following institutional policy, for this type of procedure. Post-procedural neurological assessment was performed, showing return to baseline, prior to discharge. The patient was provided with post-procedure discharge instructions, including a section on how to identify potential problems. Should any problems arise concerning this procedure, the patient was given  instructions to immediately contact us, at any time, without hesitation. In any case, we plan to contact the patient by telephone for a follow-up status report regarding this interventional procedure.  Comments:  No additional relevant information.  Plan of Care  Patient to follow-up in  4 weeks.  Patient instructed to follow instructions for restarting Coumadin which was provided by the cardiology clinic.  We will also call the patient tomorrow to make sure he is doing well and not having any postprocedural issues.  Imaging Orders     DG C-Arm 1-60 Min-No Report Procedure Orders    No procedure(s) ordered today    Medications ordered for procedure: Meds ordered this encounter  Medications  . lactated ringers infusion 1,000 mL  . fentaNYL (SUBLIMAZE) injection 25-100 mcg    Make sure Narcan is available in the pyxis when using this medication. In the event of respiratory depression (RR< 8/min): Titrate NARCAN (naloxone) in increments of 0.1 to 0.2 mg IV at 2-3 minute intervals, until desired degree of reversal.  . lidocaine (XYLOCAINE) 2 % (with pres) injection 400 mg  . ropivacaine (PF) 2 mg/mL (0.2%) (NAROPIN) injection 10 mL  . dexamethasone (DECADRON) injection 10 mg   Medications administered: We administered lactated ringers, fentaNYL, lidocaine, ropivacaine (PF) 2 mg/mL (0.2%), and dexamethasone.  See the medical record for exact dosing, route, and time of administration.  New Prescriptions   No medications on file   Disposition: Discharge home  Discharge Date & Time: 08/03/2018; 1229 hrs.   Physician-requested Follow-up: Return in about 4 weeks (around 08/31/2018) for Post Procedure Evaluation.  Future Appointments  Date Time Provider Department Center  08/10/2018 11:15 AM [facility]  09/01/2018 11:30 AM Edward Jolly, MD [facility] None  09/02/2018  1:40 PM Antonieta Iba, MD [facility]  09/21/2018  9:45 AM Helane Gunther, DPM  [facility]  10/19/2018 11:30 AM Butch Penny, NP [facility] None   Primary Care Physician: Gracelyn Nurse, MD Location: [facility] Note by: Edward Jolly, MD Date: 08/03/2018; Time: 1:52 PM  Disclaimer:  Medicine is not an exact science. The only guarantee in medicine is that nothing is guaranteed. It is important to note that the decision to proceed with this intervention was based on the information collected from the patient. The Data and conclusions were drawn from the patient's questionnaire, the interview, and the physical examination. Because the information was provided in large part by the patient, it cannot be guaranteed that it has not been purposely or unconsciously manipulated. Every effort has been made to obtain as much relevant data as possible for this evaluation. It is important to note that the conclusions that lead to this procedure are derived in large part from the available data. Always take into account that the treatment will also be dependent on availability of resources and existing treatment guidelines, considered by other Pain Management Practitioners as being common knowledge and practice, at the time of the intervention. For Medico-Legal purposes, it is also important to point out that variation in procedural techniques and pharmacological choices are the acceptable norm. The indications, contraindications, technique, and results of the above procedure should only be interpreted and judged by a Board-Certified Interventional Pain Specialist with extensive familiarity and expertise in the same exact procedure and technique.

## 2018-08-03 NOTE — Progress Notes (Signed)
Safety precautions to be maintained throughout the outpatient stay will include: orient to surroundings, keep bed in low position, maintain call bell within reach at all times, provide assistance with transfer out of bed and ambulation.  

## 2018-08-03 NOTE — Patient Instructions (Addendum)
Please follow anticoagulation instructions provided to you by Nurse Mitzi Hansen as to how to re-start Coumadin.  ____________________________________________________________________________________________  Post-Procedure Discharge Instructions  Instructions:  Apply ice: Fill a plastic sandwich bag with crushed ice. Cover it with a small towel and apply to injection site. Apply for 15 minutes then remove x 15 minutes. Repeat sequence on day of procedure, until you go to bed. The purpose is to minimize swelling and discomfort after procedure.  Apply heat: Apply heat to procedure site starting the day following the procedure. The purpose is to treat any soreness and discomfort from the procedure.  Food intake: Start with clear liquids (like water) and advance to regular food, as tolerated.   Physical activities: Keep activities to a minimum for the first 8 hours after the procedure.   Driving: If you have received any sedation, you are not allowed to drive for 24 hours after your procedure.  Blood thinner: Restart your blood thinner 6 hours after your procedure. (Only for those taking blood thinners)  Insulin: As soon as you can eat, you may resume your normal dosing schedule. (Only for those taking insulin)  Infection prevention: Keep procedure site clean and dry.  Post-procedure Pain Diary: Extremely important that this be done correctly and accurately. Recorded information will be used to determine the next step in treatment.  Pain evaluated is that of treated area only. Do not include pain from an untreated area.  Complete every hour, on the hour, for the initial 8 hours. Set an alarm to help you do this part accurately.  Do not go to sleep and have it completed later. It will not be accurate.  Follow-up appointment: Keep your follow-up appointment after the procedure. Usually 2 weeks for most procedures. (6 weeks in the case of radiofrequency.) Bring you pain diary.   Expect:  From  numbing medicine (AKA: Local Anesthetics): Numbness or decrease in pain.  Onset: Full effect within 15 minutes of injected.  Duration: It will depend on the type of local anesthetic used. On the average, 1 to 8 hours.   From steroids: Decrease in swelling or inflammation. Once inflammation is improved, relief of the pain will follow.  Onset of benefits: Depends on the amount of swelling present. The more swelling, the longer it will take for the benefits to be seen. In some cases, up to 10 days.  Duration: Steroids will stay in the system x 2 weeks. Duration of benefits will depend on multiple posibilities including persistent irritating factors.  Occasional side-effects: Facial flushing, cramps (if present, drink Gatorade and take over-the-counter Magnesium 450-500 mg once to twice a day).  From procedure: Some discomfort is to be expected once the numbing medicine wears off. This should be minimal if ice and heat are applied as instructed.  Call if:  You experience numbness and weakness that gets worse with time, as opposed to wearing off.  New onset bowel or bladder incontinence. (This applies to Spinal procedures only)  Emergency Numbers:  Durning business hours (Monday - Thursday, 8:00 AM - 4:00 PM) (Friday, 9:00 AM - 12:00 Noon): [phone]  After hours: [phone] ____________________________________________________________________________________________

## 2018-08-04 ENCOUNTER — Telehealth: Payer: Self-pay

## 2018-08-04 NOTE — Telephone Encounter (Signed)
No answer. Left message on answering machine to call if needed. 

## 2018-08-07 ENCOUNTER — Telehealth: Payer: Self-pay | Admitting: Cardiovascular Disease

## 2018-08-07 NOTE — Telephone Encounter (Signed)
LMOV to reschedule 10/9 appointment with Dr. Mariah MillingGollan

## 2018-08-10 ENCOUNTER — Ambulatory Visit (INDEPENDENT_AMBULATORY_CARE_PROVIDER_SITE_OTHER): Payer: Medicare Other

## 2018-08-10 DIAGNOSIS — I350 Nonrheumatic aortic (valve) stenosis: Secondary | ICD-10-CM

## 2018-08-10 DIAGNOSIS — Z952 Presence of prosthetic heart valve: Secondary | ICD-10-CM | POA: Diagnosis not present

## 2018-08-10 DIAGNOSIS — Z7901 Long term (current) use of anticoagulants: Secondary | ICD-10-CM | POA: Diagnosis not present

## 2018-08-10 LAB — POCT INR: INR: 1.8 — AB (ref 2.0–3.0)

## 2018-08-10 NOTE — Patient Instructions (Signed)
STOP LOVENOX SHOTS. Please take 2 tablets of coumadin today and tomorrow, then resume previous dosage of 1 tablet every day except 1.5 on Mondays, Wednesdays & Fridays.  Recheck in 10 days.

## 2018-08-19 ENCOUNTER — Ambulatory Visit: Payer: Medicare Other

## 2018-08-19 DIAGNOSIS — Z952 Presence of prosthetic heart valve: Secondary | ICD-10-CM | POA: Diagnosis not present

## 2018-08-19 DIAGNOSIS — Z7901 Long term (current) use of anticoagulants: Secondary | ICD-10-CM | POA: Diagnosis not present

## 2018-08-19 DIAGNOSIS — I350 Nonrheumatic aortic (valve) stenosis: Secondary | ICD-10-CM

## 2018-08-19 LAB — POCT INR: INR: 3.1 — AB (ref 2.0–3.0)

## 2018-08-19 NOTE — Patient Instructions (Signed)
Please continue dosage of 1 tablet every day except 1.5 on Mondays, Wednesdays & Fridays.  Recheck in 4 weeks.

## 2018-08-26 ENCOUNTER — Other Ambulatory Visit: Payer: Self-pay | Admitting: Cardiovascular Disease

## 2018-08-31 ENCOUNTER — Other Ambulatory Visit: Payer: Self-pay | Admitting: Cardiovascular Disease

## 2018-09-01 ENCOUNTER — Other Ambulatory Visit: Payer: Self-pay

## 2018-09-01 ENCOUNTER — Ambulatory Visit
Payer: Medicare Other | Attending: Student in an Organized Health Care Education/Training Program | Admitting: Student in an Organized Health Care Education/Training Program

## 2018-09-01 ENCOUNTER — Encounter: Payer: Self-pay | Admitting: Student in an Organized Health Care Education/Training Program

## 2018-09-01 ENCOUNTER — Ambulatory Visit: Payer: Medicare Other | Admitting: Cardiovascular Disease

## 2018-09-01 VITALS — BP 145/71 | HR 70 | Temp 97.7°F | Resp 16 | Ht 71.0 in | Wt 210.0 lb

## 2018-09-01 DIAGNOSIS — Z952 Presence of prosthetic heart valve: Secondary | ICD-10-CM | POA: Insufficient documentation

## 2018-09-01 DIAGNOSIS — M25562 Pain in left knee: Secondary | ICD-10-CM | POA: Diagnosis present

## 2018-09-01 DIAGNOSIS — I5032 Chronic diastolic (congestive) heart failure: Secondary | ICD-10-CM | POA: Diagnosis not present

## 2018-09-01 DIAGNOSIS — G4733 Obstructive sleep apnea (adult) (pediatric): Secondary | ICD-10-CM | POA: Insufficient documentation

## 2018-09-01 DIAGNOSIS — E871 Hypo-osmolality and hyponatremia: Secondary | ICD-10-CM | POA: Diagnosis not present

## 2018-09-01 DIAGNOSIS — D649 Anemia, unspecified: Secondary | ICD-10-CM | POA: Diagnosis not present

## 2018-09-01 DIAGNOSIS — I110 Hypertensive heart disease with heart failure: Secondary | ICD-10-CM | POA: Diagnosis not present

## 2018-09-01 DIAGNOSIS — G2581 Restless legs syndrome: Secondary | ICD-10-CM | POA: Diagnosis not present

## 2018-09-01 DIAGNOSIS — I4892 Unspecified atrial flutter: Secondary | ICD-10-CM | POA: Diagnosis not present

## 2018-09-01 DIAGNOSIS — Z79899 Other long term (current) drug therapy: Secondary | ICD-10-CM | POA: Insufficient documentation

## 2018-09-01 DIAGNOSIS — Z87891 Personal history of nicotine dependence: Secondary | ICD-10-CM | POA: Insufficient documentation

## 2018-09-01 DIAGNOSIS — G894 Chronic pain syndrome: Secondary | ICD-10-CM | POA: Insufficient documentation

## 2018-09-01 DIAGNOSIS — K219 Gastro-esophageal reflux disease without esophagitis: Secondary | ICD-10-CM | POA: Insufficient documentation

## 2018-09-01 DIAGNOSIS — M1712 Unilateral primary osteoarthritis, left knee: Secondary | ICD-10-CM | POA: Insufficient documentation

## 2018-09-01 DIAGNOSIS — F419 Anxiety disorder, unspecified: Secondary | ICD-10-CM | POA: Diagnosis not present

## 2018-09-01 DIAGNOSIS — I2510 Atherosclerotic heart disease of native coronary artery without angina pectoris: Secondary | ICD-10-CM | POA: Diagnosis not present

## 2018-09-01 DIAGNOSIS — F329 Major depressive disorder, single episode, unspecified: Secondary | ICD-10-CM | POA: Diagnosis not present

## 2018-09-01 DIAGNOSIS — G8929 Other chronic pain: Secondary | ICD-10-CM

## 2018-09-01 DIAGNOSIS — Z7901 Long term (current) use of anticoagulants: Secondary | ICD-10-CM | POA: Diagnosis not present

## 2018-09-01 NOTE — Patient Instructions (Addendum)
We will repeat left knee genicular nerve block #2.  Like last time, he will need to inform your cardiologist that you are having an injection done and that you will need to stop your Coumadin and bridge with Lovenox.  Please hold Lovenox 24 hours prior to scheduled procedure.  Please coordinate this with your cardiologist like you did for your first genicular nerve block.   Moderate Conscious Sedation, Adult Sedation is the use of medicines to promote relaxation and relieve discomfort and anxiety. Moderate conscious sedation is a type of sedation. Under moderate conscious sedation, you are less alert than normal, but you are still able to respond to instructions, touch, or both. Moderate conscious sedation is used during short medical and dental procedures. It is milder than deep sedation, which is a type of sedation under which you cannot be easily woken up. It is also milder than general anesthesia, which is the use of medicines to make you unconscious. Moderate conscious sedation allows you to return to your regular activities sooner. Tell a health care provider about:  Any allergies you have.  All medicines you are taking, including vitamins, herbs, eye drops, creams, and over-the-counter medicines.  Use of steroids (by mouth or creams).  Any problems you or family members have had with sedatives and anesthetic medicines.  Any blood disorders you have.  Any surgeries you have had.  Any medical conditions you have, such as sleep apnea.  Whether you are pregnant or may be pregnant.  Any use of cigarettes, alcohol, marijuana, or street drugs. What are the risks? Generally, this is a safe procedure. However, problems may occur, including:  Getting too much medicine (oversedation).  Nausea.  Allergic reaction to medicines.  Trouble breathing. If this happens, a breathing tube may be used to help with breathing. It will be removed when you are awake and breathing on your own.  Heart  trouble.  Lung trouble.  What happens before the procedure? Staying hydrated Follow instructions from your health care provider about hydration, which may include:  Up to 2 hours before the procedure - you may continue to drink clear liquids, such as water, clear fruit juice, black coffee, and plain tea.  Eating and drinking restrictions Follow instructions from your health care provider about eating and drinking, which may include:  8 hours before the procedure - stop eating heavy meals or foods such as meat, fried foods, or fatty foods.  6 hours before the procedure - stop eating light meals or foods, such as toast or cereal.  6 hours before the procedure - stop drinking milk or drinks that contain milk.  2 hours before the procedure - stop drinking clear liquids.  Medicine  Ask your health care provider about:  Changing or stopping your regular medicines. This is especially important if you are taking diabetes medicines or blood thinners.  Taking medicines such as aspirin and ibuprofen. These medicines can thin your blood. Do not take these medicines before your procedure if your health care provider instructs you not to.  Tests and exams  You will have a physical exam.  You may have blood tests done to show: ? How well your kidneys and liver are working. ? How well your blood can clot. General instructions  Plan to have someone take you home from the hospital or clinic.  If you will be going home right after the procedure, plan to have someone with you for 24 hours. What happens during the procedure?  An IV tube  will be inserted into one of your veins.  Medicine to help you relax (sedative) will be given through the IV tube.  The medical or dental procedure will be performed. What happens after the procedure?  Your blood pressure, heart rate, breathing rate, and blood oxygen level will be monitored often until the medicines you were given have worn off.  Do not  drive for 24 hours. This information is not intended to replace advice given to you by your health care provider. Make sure you discuss any questions you have with your health care provider. Document Released: 08/06/2001 Document Revised: 04/16/2016 Document Reviewed: 03/02/2016 Elsevier Interactive Patient Education  2018 [employer].  GENERAL RISKS AND COMPLICATIONS  What are the risk, side effects and possible complications? Generally speaking, most procedures are safe.  However, with any procedure there are risks, side effects, and the possibility of complications.  The risks and complications are dependent upon the sites that are lesioned, or the type of nerve block to be performed.  The closer the procedure is to the spine, the more serious the risks are.  Great care is taken when placing the radio frequency needles, block needles or lesioning probes, but sometimes complications can occur. 1. Infection: Any time there is an injection through the skin, there is a risk of infection.  This is why sterile conditions are used for these blocks.  There are four possible types of infection. 1. Localized skin infection. 2. Central Nervous System Infection-This can be in the form of Meningitis, which can be deadly. 3. Epidural Infections-This can be in the form of an epidural abscess, which can cause pressure inside of the spine, causing compression of the spinal cord with subsequent paralysis. This would require an emergency surgery to decompress, and there are no guarantees that the patient would recover from the paralysis. 4. Discitis-This is an infection of the intervertebral discs.  It occurs in about 1% of discography procedures.  It is difficult to treat and it may lead to surgery.        2. Pain: the needles have to go through skin and soft tissues, will cause soreness.       3. Damage to internal structures:  The nerves to be lesioned may be near blood vessels or    other nerves which can be  potentially damaged.       4. Bleeding: Bleeding is more common if the patient is taking blood thinners such as  aspirin, Coumadin, Ticiid, Plavix, etc., or if he/she have some genetic predisposition  such as hemophilia. Bleeding into the spinal canal can cause compression of the spinal  cord with subsequent paralysis.  This would require an emergency surgery to  decompress and there are no guarantees that the patient would recover from the  paralysis.       5. Pneumothorax:  Puncturing of a lung is a possibility, every time a needle is introduced in  the area of the chest or upper back.  Pneumothorax refers to free air around the  collapsed lung(s), inside of the thoracic cavity (chest cavity).  Another two possible  complications related to a similar event would include: Hemothorax and Chylothorax.   These are variations of the Pneumothorax, where instead of air around the collapsed  lung(s), you may have blood or chyle, respectively.       6. Spinal headaches: They may occur with any procedures in the area of the spine.       7. Persistent CSF (Cerebro-Spinal  Fluid) leakage: This is a rare problem, but may occur  with prolonged intrathecal or epidural catheters either due to the formation of a fistulous  track or a dural tear.       8. Nerve damage: By working so close to the spinal cord, there is always a possibility of  nerve damage, which could be as serious as a permanent spinal cord injury with  paralysis.       9. Death:  Although rare, severe deadly allergic reactions known as "Anaphylactic  reaction" can occur to any of the medications used.      10. Worsening of the symptoms:  We can always make thing worse.  What are the chances of something like this happening? Chances of any of this occuring are extremely low.  By statistics, you have more of a chance of getting killed in a motor vehicle accident: while driving to the hospital than any of the above occurring .  Nevertheless, you should be  aware that they are possibilities.  In general, it is similar to taking a shower.  Everybody knows that you can slip, hit your head and get killed.  Does that mean that you should not shower again?  Nevertheless always keep in mind that statistics do not mean anything if you happen to be on the wrong side of them.  Even if a procedure has a 1 (one) in a 1,000,000 (million) chance of going wrong, it you happen to be that one..Also, keep in mind that by statistics, you have more of a chance of having something go wrong when taking medications.  Who should not have this procedure? If you are on a blood thinning medication (e.g. Coumadin, Plavix, see list of "Blood Thinners"), or if you have an active infection going on, you should not have the procedure.  If you are taking any blood thinners, please inform your physician.  How should I prepare for this procedure?  Do not eat or drink anything at least six hours prior to the procedure.  Bring a driver with you .  It cannot be a taxi.  Come accompanied by an adult that can drive you back, and that is strong enough to help you if your legs get weak or numb from the local anesthetic.  Take all of your medicines the morning of the procedure with just enough water to swallow them.  If you have diabetes, make sure that you are scheduled to have your procedure done first thing in the morning, whenever possible.  If you have diabetes, take only half of your insulin dose and notify our nurse that you have done so as soon as you arrive at the clinic.  If you are diabetic, but only take blood sugar pills (oral hypoglycemic), then do not take them on the morning of your procedure.  You may take them after you have had the procedure.  Do not take aspirin or any aspirin-containing medications, at least eleven (11) days prior to the procedure.  They may prolong bleeding.  Wear loose fitting clothing that may be easy to take off and that you would not mind if it  got stained with Betadine or blood.  Do not wear any jewelry or perfume  Remove any nail coloring.  It will interfere with some of our monitoring equipment.  NOTE: Remember that this is not meant to be interpreted as a complete list of all possible complications.  Unforeseen problems may occur.  BLOOD THINNERS The following drugs contain  aspirin or other products, which can cause increased bleeding during surgery and should not be taken for 2 weeks prior to and 1 week after surgery.  If you should need take something for relief of minor pain, you may take acetaminophen which is found in Tylenol,m Datril, Anacin-3 and Panadol. It is not blood thinner. The products listed below are.  Do not take any of the products listed below in addition to any listed on your instruction sheet.  A.P.C or A.P.C with Codeine Codeine Phosphate Capsules #3 Ibuprofen Ridaura  ABC compound Congesprin Imuran rimadil  Advil Cope Indocin Robaxisal  Alka-Seltzer Effervescent Pain Reliever and Antacid Coricidin or Coricidin-D  Indomethacin Rufen  Alka-Seltzer plus Cold Medicine Cosprin Ketoprofen S-A-C Tablets  Anacin Analgesic Tablets or Capsules Coumadin Korlgesic Salflex  Anacin Extra Strength Analgesic tablets or capsules CP-2 Tablets Lanoril Salicylate  Anaprox Cuprimine Capsules Levenox Salocol  Anexsia-D Dalteparin Magan Salsalate  Anodynos Darvon compound Magnesium Salicylate Sine-off  Ansaid Dasin Capsules Magsal Sodium Salicylate  Anturane Depen Capsules Marnal Soma  APF Arthritis pain formula Dewitt's Pills Measurin Stanback  Argesic Dia-Gesic Meclofenamic Sulfinpyrazone  Arthritis Bayer Timed Release Aspirin Diclofenac Meclomen Sulindac  Arthritis pain formula Anacin Dicumarol Medipren Supac  Analgesic (Safety coated) Arthralgen Diffunasal Mefanamic Suprofen  Arthritis Strength Bufferin Dihydrocodeine Mepro Compound Suprol  Arthropan liquid Dopirydamole Methcarbomol with Aspirin Synalgos  ASA  tablets/Enseals Disalcid Micrainin Tagament  Ascriptin Doan's Midol Talwin  Ascriptin A/D Dolene Mobidin Tanderil  Ascriptin Extra Strength Dolobid Moblgesic Ticlid  Ascriptin with Codeine Doloprin or Doloprin with Codeine Momentum Tolectin  Asperbuf Duoprin Mono-gesic Trendar  Aspergum Duradyne Motrin or Motrin IB Triminicin  Aspirin plain, buffered or enteric coated Durasal Myochrisine Trigesic  Aspirin Suppositories Easprin Nalfon Trillsate  Aspirin with Codeine Ecotrin Regular or Extra Strength Naprosyn Uracel  Atromid-S Efficin Naproxen Ursinus  Auranofin Capsules Elmiron Neocylate Vanquish  Axotal Emagrin Norgesic Verin  Azathioprine Empirin or Empirin with Codeine Normiflo Vitamin E  Azolid Emprazil Nuprin Voltaren  Bayer Aspirin plain, buffered or children's or timed BC Tablets or powders Encaprin Orgaran Warfarin Sodium  Buff-a-Comp Enoxaparin Orudis Zorpin  Buff-a-Comp with Codeine Equegesic Os-Cal-Gesic   Buffaprin Excedrin plain, buffered or Extra Strength Oxalid   Bufferin Arthritis Strength Feldene Oxphenbutazone   Bufferin plain or Extra Strength Feldene Capsules Oxycodone with Aspirin   Bufferin with Codeine Fenoprofen Fenoprofen Pabalate or Pabalate-SF   Buffets II Flogesic Panagesic   Buffinol plain or Extra Strength Florinal or Florinal with Codeine Panwarfarin   Buf-Tabs Flurbiprofen Penicillamine   Butalbital Compound Four-way cold tablets Penicillin   Butazolidin Fragmin Pepto-Bismol   Carbenicillin Geminisyn Percodan   Carna Arthritis Reliever Geopen Persantine   Carprofen Gold's salt Persistin   Chloramphenicol Goody's Phenylbutazone   Chloromycetin Haltrain Piroxlcam   Clmetidine heparin Plaquenil   Cllnoril Hyco-pap Ponstel   Clofibrate Hydroxy chloroquine Propoxyphen         Before stopping any of these medications, be sure to consult the physician who ordered them.  Some, such as Coumadin (Warfarin) are ordered to prevent or treat serious conditions  such as "deep thrombosis", "pumonary embolisms", and other heart problems.  The amount of time that you may need off of the medication may also vary with the medication and the reason for which you were taking it.  If you are taking any of these medications, please make sure you notify your pain physician before you undergo any procedures.

## 2018-09-01 NOTE — Progress Notes (Signed)
Safety precautions to be maintained throughout the outpatient stay will include: orient to surroundings, keep bed in low position, maintain call bell within reach at all times, provide assistance with transfer out of bed and ambulation.  

## 2018-09-01 NOTE — Progress Notes (Signed)
Patient's Name: [name]  MRN: [id number]  Referring Provider: Baxter Hire, MD  DOB: [birth year]  PCP: Baxter Hire, MD  DOS: 09/01/2018  Note by: Gillis Santa, MD  Service setting: Ambulatory outpatient  Specialty: Interventional Pain Management  Location: [facility] (AMB) Pain Management Facility    Patient type: Established   Primary Reason(s) for Visit: Encounter for post-procedure evaluation of chronic illness with mild to moderate exacerbation CC: Knee Pain (left)  HPI  [name] is a 84 y.o. year old, male patient, who comes today for a post-procedure evaluation. He has GERD (gastroesophageal reflux disease); Bradycardia; Depression; Anxiety; Hypertension; Decreased hearing; Coronary artery disease; Aortic stenosis; Warfarin anticoagulation; Carotid bruit; Anemia; Blood loss anemia; S/P AVR (aortic valve replacement); C. difficile colitis; RLS (restless legs syndrome); Bilateral leg edema; Encounter for anticoagulation discussion and counseling; Chronic hyponatremia; Chronic diastolic CHF (congestive heart failure) ([place]); Lymphedema; Obstructive sleep apnea; and H/O atrial flutter on their problem list. His primarily concern today is the Knee Pain (left)  Pain Assessment: Location: Left Knee Radiating: denies Onset: More than a month ago Duration: Chronic pain Quality: ("No pain at the moment, but it can hit at any time") Severity: 0-No pain/10 (subjective, self-reported pain score)  Note: Reported level is compatible with observation.                         When using our objective Pain Scale, levels between 6 and 10/10 are said to belong in an emergency room, as it progressively worsens from a 6/10, described as severely limiting, requiring emergency care not usually available at an outpatient pain management facility. At a 6/10 level, communication becomes difficult and requires great effort. Assistance to reach the emergency department may be required. Facial flushing and  profuse sweating along with potentially dangerous increases in heart rate and blood pressure will be evident. Effect on ADL: "slows me down" Timing: Intermittent Modifying factors: hemp freeze BP: (!) 145/71  HR: 70  [name] comes in today for post-procedure evaluation after the treatment done on 08/04/2018.  Further details on both, my assessment(s), as well as the proposed treatment plan, please see below.  Post-Procedure Assessment  08/03/2018 Procedure: Left knee genicular nerve block Pre-procedure pain score:  5/10 Post-procedure pain score: 0/10         Influential Factors: BMI: 29.29 kg/m Intra-procedural challenges: None observed.         Assessment challenges: None detected.              Reported side-effects: None.        Post-procedural adverse reactions or complications: None reported         Sedation: Please see nurses note. When no sedatives are used, the analgesic levels obtained are directly associated to the effectiveness of the local anesthetics. However, when sedation is provided, the level of analgesia obtained during the initial 1 hour following the intervention, is believed to be the result of a combination of factors. These factors may include, but are not limited to: 1. The effectiveness of the local anesthetics used. 2. The effects of the analgesic(s) and/or anxiolytic(s) used. 3. The degree of discomfort experienced by the patient at the time of the procedure. 4. The patients ability and reliability in recalling and recording the events. 5. The presence and influence of possible secondary gains and/or psychosocial factors. Reported result: Relief experienced during the 1st hour after the procedure: 100 % (Ultra-Short Term Relief)  Interpretative annotation: Clinically appropriate result. Analgesia during this period is likely to be Local Anesthetic and/or IV Sedative (Analgesic/Anxiolytic) related.          Effects of local anesthetic: The analgesic  effects attained during this period are directly associated to the localized infiltration of local anesthetics and therefore cary significant diagnostic value as to the etiological location, or anatomical origin, of the pain. Expected duration of relief is directly dependent on the pharmacodynamics of the local anesthetic used. Long-acting (4-6 hours) anesthetics used.  Reported result: Relief during the next 4 to 6 hour after the procedure: 100 % (Short-Term Relief)            Interpretative annotation: Clinically appropriate result. Analgesia during this period is likely to be Local Anesthetic-related.          Long-term benefit: Defined as the period of time past the expected duration of local anesthetics (1 hour for short-acting and 4-6 hours for long-acting). With the possible exception of prolonged sympathetic blockade from the local anesthetics, benefits during this period are typically attributed to, or associated with, other factors such as analgesic sensory neuropraxia, antiinflammatory effects, or beneficial biochemical changes provided by agents other than the local anesthetics.  Reported result: Extended relief following procedure: 60 % (Long-Term Relief)            Interpretative annotation: Clinically possible results. Good relief. No permanent benefit expected. Inflammation plays a part in the etiology to the pain.          Current benefits: Defined as reported results that persistent at this point in time.   Analgesia: 25-50 %            Function: Somewhat improved ROM: Somewhat improved Interpretative annotation: Recurrence of symptoms. No permanent benefit expected. Effective diagnostic intervention.          Interpretation: Results would suggest a successful diagnostic intervention.                  Plan:  Repeat treatment or therapy and compare extent and duration of benefits.  Patient will need cardiac clearance to stop Coumadin and bridge with Lovenox like he did last time for  his first genicular nerve block.  Patient will contact his cardiologist to discuss Lovenox bridge.  I instructed the patient that he must discontinue his Lovenox 24 hours prior to his scheduled procedure here.                Laboratory Chemistry  Inflammation Markers (CRP: Acute Phase) (ESR: Chronic Phase) Lab Results  Component Value Date   CRP <0.8 02/05/2018   ESRSEDRATE 7 02/05/2018                         Rheumatology Markers No results found for: RF, ANA, LABURIC, URICUR, LYMEIGGIGMAB, LYMEABIGMQN, HLAB27                      Renal Function Markers Lab Results  Component Value Date   BUN 23 (H) 08/16/2017   CREATININE 0.99 08/16/2017   BCR 20 03/20/2017   GFRAA >60 08/16/2017   GFRNONAA >60 08/16/2017                             Hepatic Function Markers Lab Results  Component Value Date   AST 23 02/22/2017   ALT 15 (L) 02/22/2017   ALBUMIN 4.3 02/22/2017   ALKPHOS  67 02/22/2017   LIPASE 116 07/28/2014                        Electrolytes Lab Results  Component Value Date   NA 131 (L) 08/16/2017   K 4.6 08/16/2017   CL 97 (L) 08/16/2017   CALCIUM 8.9 08/16/2017   MG 2.0 04/17/2015                        Neuropathy Markers Lab Results  Component Value Date   HGBA1C 5.3 10/31/2014                        CNS Tests No results found for: COLORCSF, APPEARCSF, RBCCOUNTCSF, WBCCSF, POLYSCSF, LYMPHSCSF, EOSCSF, PROTEINCSF, GLUCCSF, JCVIRUS, CSFOLI, IGGCSF                      Bone Pathology Markers No results found for: VD25OH, JS283TD1VOH, YW7371GG2, IR4854OE7, 25OHVITD1, 25OHVITD2, 25OHVITD3, TESTOFREE, TESTOSTERONE                       Coagulation Parameters Lab Results  Component Value Date   INR 3.1 (A) 08/19/2018   LABPROT 22.7 (H) 08/07/2015   APTT 40 (H) 04/02/2015   PLT 192 02/05/2018                        Cardiovascular Markers Lab Results  Component Value Date   BNP 574.0 (H) 07/31/2015   CKTOTAL 71 08/31/2010   CKMB 1.6 08/31/2010    TROPONINI <0.03 08/16/2017   HGB 13.2 02/05/2018   HCT 39.5 (L) 02/05/2018                         CA Markers No results found for: CEA, CA125, LABCA2                      Note: Lab results reviewed.  Recent Diagnostic Imaging Results  DG C-Arm 1-60 Min-No Report Fluoroscopy was utilized by the requesting physician.  No radiographic  interpretation.   Complexity Note: Imaging results reviewed. Results shared with Mr. Kope, using Layman's terms.                         Meds   Current Outpatient Medications:  .  Cetirizine HCl 10 MG CAPS, Take 10 mg by mouth daily., Disp: , Rfl:  .  citalopram (CELEXA) 20 MG tablet, Take 20 mg by mouth daily., Disp: , Rfl:  .  cloNIDine (CATAPRES) 0.1 MG tablet, Take 1 tablet (0.1 mg total) by mouth 2 (two) times daily., Disp: 180 tablet, Rfl: 3 .  cloNIDine (CATAPRES) 0.1 MG tablet, Take 1 tablet (0.1 mg total) by mouth 2 (two) times daily., Disp: 180 tablet, Rfl: 0 .  doxazosin (CARDURA) 8 MG tablet, TAKE 1 TABLET BY MOUTH  TWICE A DAY, Disp: 180 tablet, Rfl: 1 .  finasteride (PROSCAR) 5 MG tablet, Take 5 mg by mouth daily.  , Disp: , Rfl:  .  furosemide (LASIX) 20 MG tablet, TAKE 2 TABLETS BY MOUTH TWO TIMES DAILY, Disp: 360 tablet, Rfl: 3 .  isosorbide mononitrate (IMDUR) 60 MG 24 hr tablet, Take 60 mg by mouth 2 (two) times daily., Disp: , Rfl:  .  metoprolol succinate (TOPROL-XL) 25 MG 24 hr tablet, Take 0.5 tablets (  12.5 mg total) by mouth daily., Disp: 45 tablet, Rfl: 3 .  potassium chloride (K-DUR) 10 MEQ tablet, Take 1 tablet (10 mEq total) by mouth daily., Disp: 90 tablet, Rfl: 3 .  potassium chloride (K-DUR,KLOR-CON) 10 MEQ tablet, TAKE 1 TABLET BY MOUTH  DAILY, Disp: 90 tablet, Rfl: 3 .  ranitidine (ZANTAC) 150 MG tablet, Take 150 mg by mouth as needed., Disp: , Rfl:  .  rOPINIRole (REQUIP) 2 MG tablet, TAKE 1 TABLET BY MOUTH 4  TIMES DAILY, Disp: 360 tablet, Rfl: 3 .  Tamsulosin HCl (FLOMAX) 0.4 MG CAPS, Take 0.4 mg by mouth at  bedtime. , Disp: , Rfl:  .  traZODone (DESYREL) 50 MG tablet, Take 50 mg by mouth at bedtime., Disp: , Rfl:  .  warfarin (COUMADIN) 5 MG tablet, TAKE AS DIRECTED BY  COUMADIN CLINIC, Disp: 150 tablet, Rfl: 1  ROS  Constitutional: Denies any fever or chills Gastrointestinal: No reported hemesis, hematochezia, vomiting, or acute GI distress Musculoskeletal: Denies any acute onset joint swelling, redness, loss of ROM, or weakness Neurological: No reported episodes of acute onset apraxia, aphasia, dysarthria, agnosia, amnesia, paralysis, loss of coordination, or loss of consciousness  Allergies  Mr. Apodaca is allergic to levofloxacin and sulfa antibiotics.  [place]  Drug: Mr. Crago  reports that he does not use drugs. Alcohol:  reports that he drinks about 3.0 standard drinks of alcohol per week. Tobacco:  reports that he quit smoking about 40 years ago. His smoking use included cigarettes. He has never used smokeless tobacco. Medical:  has a past medical history of Anxiety (10/11), Aortic stenosis, Bradycardia, C. difficile colitis, Carotid bruit, Chronic diastolic CHF (congestive heart failure) ([place]), Coronary artery disease, Decreased hearing, Depression, Gastric ulcer, GERD (gastroesophageal reflux disease), Hypertension, RLS (restless legs syndrome) (08/23/2015), S/P AVR, and SOB (shortness of breath) (10/11). Surgical: Mr. Anspach  has a past surgical history that includes Valve replacement (1/02); Cardiac catheterization; Joint replacement; Hernia repair; Esophagogastroduodenoscopy (N/A, 04/05/2015); Esophagogastroduodenoscopy (N/A, 04/17/2015); Esophagogastroduodenoscopy (N/A, 08/02/2015); and Total hip arthroplasty. Family: family history includes Diabetes type II in his mother; Heart attack in his mother; Heart disease in his father and mother; Hypertension in his father, mother, and unknown relative; Stroke in his brother, brother, and father.  Constitutional Exam  General appearance: Well  nourished, well developed, and well hydrated. In no apparent acute distress Vitals:   09/01/18 1129  BP: (!) 145/71  Pulse: 70  Resp: 16  Temp: 97.7 F (36.5 C)  TempSrc: Oral  SpO2: 100%  Weight: 210 lb (95.3 kg)  Height: 5' 11" (1.803 m)   BMI Assessment: Estimated body mass index is 29.29 kg/m as calculated from the following:   Height as of this encounter: 5' 11" (1.803 m).   Weight as of this encounter: 210 lb (95.3 kg).  BMI interpretation table: BMI level Category Range association with higher incidence of chronic pain  <18 kg/m2 Underweight   18.5-24.9 kg/m2 Ideal body weight   25-29.9 kg/m2 Overweight Increased incidence by 20%  30-34.9 kg/m2 Obese (Class I) Increased incidence by 68%  35-39.9 kg/m2 Severe obesity (Class II) Increased incidence by 136%  >40 kg/m2 Extreme obesity (Class III) Increased incidence by 254%   Patient's current BMI Ideal Body weight  Body mass index is 29.29 kg/m. Ideal body weight: 75.3 kg (166 lb 0.1 oz) Adjusted ideal body weight: 83.3 kg (183 lb 9.7 oz)   BMI Readings from Last 4 Encounters:  09/01/18 29.29 kg/m  08/03/18 28.59 kg/m  07/13/18  28.87 kg/m  03/02/18 28.87 kg/m   Wt Readings from Last 4 Encounters:  09/01/18 210 lb (95.3 kg)  08/03/18 205 lb (93 kg)  07/13/18 207 lb (93.9 kg)  03/02/18 207 lb (93.9 kg)  Psych/Mental status: Alert, oriented x 3 (person, place, & time)       Eyes: PERLA Respiratory: No evidence of acute respiratory distress  Cervical Spine Area Exam  Skin & Axial Inspection: No masses, redness, edema, swelling, or associated skin lesions Alignment: Symmetrical Functional ROM: Unrestricted ROM      Stability: No instability detected Muscle Tone/Strength: Functionally intact. No obvious neuro-muscular anomalies detected. Sensory (Neurological): Unimpaired Palpation: No palpable anomalies              Upper Extremity (UE) Exam    Side: Right upper extremity  Side: Left upper extremity  Skin  & Extremity Inspection: Skin color, temperature, and hair growth are WNL. No peripheral edema or cyanosis. No masses, redness, swelling, asymmetry, or associated skin lesions. No contractures.  Skin & Extremity Inspection: Skin color, temperature, and hair growth are WNL. No peripheral edema or cyanosis. No masses, redness, swelling, asymmetry, or associated skin lesions. No contractures.  Functional ROM: Unrestricted ROM          Functional ROM: Unrestricted ROM          Muscle Tone/Strength: Functionally intact. No obvious neuro-muscular anomalies detected.  Muscle Tone/Strength: Functionally intact. No obvious neuro-muscular anomalies detected.  Sensory (Neurological): Unimpaired          Sensory (Neurological): Unimpaired          Palpation: No palpable anomalies              Palpation: No palpable anomalies              Provocative Test(s):  Phalen's test: deferred Tinel's test: deferred Apley's scratch test (touch opposite shoulder):  Action 1 (Across chest): deferred Action 2 (Overhead): deferred Action 3 (LB reach): deferred   Provocative Test(s):  Phalen's test: deferred Tinel's test: deferred Apley's scratch test (touch opposite shoulder):  Action 1 (Across chest): deferred Action 2 (Overhead): deferred Action 3 (LB reach): deferred    Thoracic Spine Area Exam  Skin & Axial Inspection: No masses, redness, or swelling Alignment: Symmetrical Functional ROM: Unrestricted ROM Stability: No instability detected Muscle Tone/Strength: Functionally intact. No obvious neuro-muscular anomalies detected. Sensory (Neurological): Unimpaired Muscle strength & Tone: No palpable anomalies  Lumbar Spine Area Exam  Skin & Axial Inspection: No masses, redness, or swelling Alignment: Symmetrical Functional ROM: Unrestricted ROM       Stability: No instability detected Muscle Tone/Strength: Functionally intact. No obvious neuro-muscular anomalies detected. Sensory (Neurological):  Unimpaired Palpation: No palpable anomalies       Provocative Tests: Hyperextension/rotation test: deferred today       Lumbar quadrant test (Kemp's test): deferred today       Lateral bending test: deferred today       Patrick's Maneuver: deferred today                   FABER test: deferred today                   S-I anterior distraction/compression test: deferred today         S-I lateral compression test: deferred today         S-I Thigh-thrust test: deferred today         S-I Gaenslen's test: deferred today  Gait & Posture Assessment  Ambulation: Patient ambulates using a walker Gait: Significantly limited. Dependent on assistive device to ambulate Posture: Thoracic kyphosis   Lower Extremity Exam    Side: Right lower extremity  Side: Left lower extremity  Stability: No instability observed          Stability: No instability observed          Skin & Extremity Inspection: Skin color, temperature, and hair growth are WNL. No peripheral edema or cyanosis. No masses, redness, swelling, asymmetry, or associated skin lesions. No contractures.  Skin & Extremity Inspection: Skin color, temperature, and hair growth are WNL. No peripheral edema or cyanosis. No masses, redness, swelling, asymmetry, or associated skin lesions. No contractures.  Functional ROM: Unrestricted ROM                  Functional ROM: Decreased ROM for hip and knee joints          Muscle Tone/Strength: Functionally intact. No obvious neuro-muscular anomalies detected.  Muscle Tone/Strength: Functionally intact. No obvious neuro-muscular anomalies detected.  Sensory (Neurological): Unimpaired  Sensory (Neurological): Arthropathic arthralgia  Palpation: No palpable anomalies  Palpation: Complains of area being tender to palpation     Assessment  Primary Diagnosis & Pertinent Problem List: The primary encounter diagnosis was Primary osteoarthritis of left knee. Diagnoses of Chronic pain of left  knee, S/P AVR (aortic valve replacement), Warfarin anticoagulation, and Chronic pain syndrome were also pertinent to this visit.  Status Diagnosis  Responding Responding Persistent 1. Primary osteoarthritis of left knee   2. Chronic pain of left knee   3. S/P AVR (aortic valve replacement)   4. Warfarin anticoagulation   5. Chronic pain syndrome     General Recommendations: The pain condition that the patient suffers from is best treated with a multidisciplinary approach that involves an increase in physical activity to prevent de-conditioning and worsening of the pain cycle, as well as psychological counseling (formal and/or informal) to address the co-morbid psychological affects of pain. Treatment will often involve judicious use of pain medications and interventional procedures to decrease the pain, allowing the patient to participate in the physical activity that will ultimately produce long-lasting pain reductions. The goal of the multidisciplinary approach is to return the patient to a higher level of overall function and to restore their ability to perform activities of daily living.  84 year old male with a history of left knee osteoarthritis who follows up status post left knee genicular nerve block #1 performed on 08/03/2018.  Patient endorses greater than 50% pain relief for the first 3 weeks after his left knee genicular nerve block with improvement in ambulation.  Patient states that he has less pain when he is walking with his walker.  We discussed repeating genicular nerve block #2 followed by potential radiofrequency ablation of the left knee genicular nerves.  Of note patient is on Coumadin status post aortic valve replacement.  Patient will need to bridge with Lovenox prior to his left knee genicular nerve block #2.  Patient instructed to hold Lovenox 24 hours prior to his scheduled left knee genicular nerve block.  Patient coordinated this with cardiology prior to his left knee  genicular nerve block #1.  He states that he will reach out to them to discuss Lovenox bridge in preparation of his left knee genicular nerve block #2.  Risks and benefits of the procedure were discussed along with risks and benefits of stopping Coumadin treatment.  I also informed the patient that  this is an elective procedure that we are doing.  Patient is finding benefit in regards to his left knee pain with genicular nerve block and wants to proceed with genicular nerve block #2 and possible radiofrequency ablation understanding the risks of discontinuing Coumadin and bridging with Lovenox therapy.   Plan of Care  Pharmacotherapy (Medications Ordered): No orders of the defined types were placed in this encounter.  Lab-work, procedure(s), and/or referral(s): Orders Placed This Encounter  Procedures  . GENICULAR NERVE BLOCK   Provider-requested follow-up: Return in about 4 weeks (around 09/29/2018) for Procedure, Blood Thinner Protocol. Time Note: Greater than 50% of the 25 minute(s) of face-to-face time spent with Mr. Schuyler, was spent in counseling/coordination of care regarding: Mr. Monie primary cause of pain, the treatment plan, treatment alternatives, the risks and possible complications of proposed treatment, going over the informed consent, the results, interpretation and significance of  his recent diagnostic interventional treatment(s), realistic expectations and the goals of pain management (increased in functionality).  Future Appointments  Date Time Provider [place]  09/21/2018  2:40 PM Minna Merritts, MD [facility]  09/21/2018  3:00 PM [facility]  09/30/2018 11:00 AM Gillis Santa, MD [facility] None  10/19/2018 11:30 AM Ward Givens, NP [facility] None    Primary Care Physician: Baxter Hire, MD Location: [facility] Note by: Gillis Santa, M.D Date: 09/01/2018; Time: 12:28  PM  Patient Instructions   We will repeat left knee genicular nerve block #2.  Like last time, he will need to inform your cardiologist that you are having an injection done and that you will need to stop your Coumadin and bridge with Lovenox.  Please hold Lovenox 24 hours prior to scheduled procedure.  Please coordinate this with your cardiologist like you did for your first genicular nerve block.   Moderate Conscious Sedation, Adult Sedation is the use of medicines to promote relaxation and relieve discomfort and anxiety. Moderate conscious sedation is a type of sedation. Under moderate conscious sedation, you are less alert than normal, but you are still able to respond to instructions, touch, or both. Moderate conscious sedation is used during short medical and dental procedures. It is milder than deep sedation, which is a type of sedation under which you cannot be easily woken up. It is also milder than general anesthesia, which is the use of medicines to make you unconscious. Moderate conscious sedation allows you to return to your regular activities sooner. Tell a health care provider about:  Any allergies you have.  All medicines you are taking, including vitamins, herbs, eye drops, creams, and over-the-counter medicines.  Use of steroids (by mouth or creams).  Any problems you or family members have had with sedatives and anesthetic medicines.  Any blood disorders you have.  Any surgeries you have had.  Any medical conditions you have, such as sleep apnea.  Whether you are pregnant or may be pregnant.  Any use of cigarettes, alcohol, marijuana, or street drugs. What are the risks? Generally, this is a safe procedure. However, problems may occur, including:  Getting too much medicine (oversedation).  Nausea.  Allergic reaction to medicines.  Trouble breathing. If this happens, a breathing tube may be used to help with breathing. It will be removed when you are awake and  breathing on your own.  Heart trouble.  Lung trouble.  What happens before the procedure? Staying hydrated Follow instructions from your health care provider about hydration, which may include:  Up to 2 hours before the procedure - you may continue to drink clear liquids, such as water, clear fruit juice, black coffee, and plain tea.  Eating and drinking restrictions Follow instructions from your health care provider about eating and drinking, which may include:  8 hours before the procedure - stop eating heavy meals or foods such as meat, fried foods, or fatty foods.  6 hours before the procedure - stop eating light meals or foods, such as toast or cereal.  6 hours before the procedure - stop drinking milk or drinks that contain milk.  2 hours before the procedure - stop drinking clear liquids.  Medicine  Ask your health care provider about:  Changing or stopping your regular medicines. This is especially important if you are taking diabetes medicines or blood thinners.  Taking medicines such as aspirin and ibuprofen. These medicines can thin your blood. Do not take these medicines before your procedure if your health care provider instructs you not to.  Tests and exams  You will have a physical exam.  You may have blood tests done to show: ? How well your kidneys and liver are working. ? How well your blood can clot. General instructions  Plan to have someone take you home from the hospital or clinic.  If you will be going home right after the procedure, plan to have someone with you for 24 hours. What happens during the procedure?  An IV tube will be inserted into one of your veins.  Medicine to help you relax (sedative) will be given through the IV tube.  The medical or dental procedure will be performed. What happens after the procedure?  Your blood pressure, heart rate, breathing rate, and blood oxygen level will be monitored often until the medicines you  were given have worn off.  Do not drive for 24 hours. This information is not intended to replace advice given to you by your health care provider. Make sure you discuss any questions you have with your health care provider. Document Released: 08/06/2001 Document Revised: 04/16/2016 Document Reviewed: 03/02/2016 Elsevier Interactive Patient Education  2018 [employer]  What are the risk, side effects and possible complications? Generally speaking, most procedures are safe.  However, with any procedure there are risks, side effects, and the possibility of complications.  The risks and complications are dependent upon the sites that are lesioned, or the type of nerve block to be performed.  The closer the procedure is to the spine, the more serious the risks are.  Great care is taken when placing the radio frequency needles, block needles or lesioning probes, but sometimes complications can occur. 1. Infection: Any time there is an injection through the skin, there is a risk of infection.  This is why sterile conditions are used for these blocks.  There are four possible types of infection. 1. Localized skin infection. 2. Central Nervous System Infection-This can be in the form of Meningitis, which can be deadly. 3. Epidural Infections-This can be in the form of an epidural abscess, which can cause pressure inside of the spine, causing compression of the spinal cord with subsequent paralysis. This would require an emergency surgery to decompress, and there are no guarantees that the patient would recover from the paralysis. 4. Discitis-This is an infection of the intervertebral discs.  It occurs in about 1% of discography procedures.  It is difficult to treat and it may lead to surgery.  2. Pain: the needles have to go through skin and soft tissues, will cause soreness.       3. Damage to internal structures:  The nerves to be lesioned may be near blood  vessels or    other nerves which can be potentially damaged.       4. Bleeding: Bleeding is more common if the patient is taking blood thinners such as  aspirin, Coumadin, Ticiid, Plavix, etc., or if he/she have some genetic predisposition  such as hemophilia. Bleeding into the spinal canal can cause compression of the spinal  cord with subsequent paralysis.  This would require an emergency surgery to  decompress and there are no guarantees that the patient would recover from the  paralysis.       5. Pneumothorax:  Puncturing of a lung is a possibility, every time a needle is introduced in  the area of the chest or upper back.  Pneumothorax refers to free air around the  collapsed lung(s), inside of the thoracic cavity (chest cavity).  Another two possible  complications related to a similar event would include: Hemothorax and Chylothorax.   These are variations of the Pneumothorax, where instead of air around the collapsed  lung(s), you may have blood or chyle, respectively.       6. Spinal headaches: They may occur with any procedures in the area of the spine.       7. Persistent CSF (Cerebro-Spinal Fluid) leakage: This is a rare problem, but may occur  with prolonged intrathecal or epidural catheters either due to the formation of a fistulous  track or a dural tear.       8. Nerve damage: By working so close to the spinal cord, there is always a possibility of  nerve damage, which could be as serious as a permanent spinal cord injury with  paralysis.       9. Death:  Although rare, severe deadly allergic reactions known as "Anaphylactic  reaction" can occur to any of the medications used.      10. Worsening of the symptoms:  We can always make thing worse.  What are the chances of something like this happening? Chances of any of this occuring are extremely low.  By statistics, you have more of a chance of getting killed in a motor vehicle accident: while driving to the hospital than any of the above  occurring .  Nevertheless, you should be aware that they are possibilities.  In general, it is similar to taking a shower.  Everybody knows that you can slip, hit your head and get killed.  Does that mean that you should not shower again?  Nevertheless always keep in mind that statistics do not mean anything if you happen to be on the wrong side of them.  Even if a procedure has a 1 (one) in a 1,000,000 (million) chance of going wrong, it you happen to be that one..Also, keep in mind that by statistics, you have more of a chance of having something go wrong when taking medications.  Who should not have this procedure? If you are on a blood thinning medication (e.g. Coumadin, Plavix, see list of "Blood Thinners"), or if you have an active infection going on, you should not have the procedure.  If you are taking any blood thinners, please inform your physician.  How should I prepare for this procedure?  Do not eat or drink anything at least six hours prior to the procedure.  Bring a driver  with you .  It cannot be a taxi.  Come accompanied by an adult that can drive you back, and that is strong enough to help you if your legs get weak or numb from the local anesthetic.  Take all of your medicines the morning of the procedure with just enough water to swallow them.  If you have diabetes, make sure that you are scheduled to have your procedure done first thing in the morning, whenever possible.  If you have diabetes, take only half of your insulin dose and notify our nurse that you have done so as soon as you arrive at the clinic.  If you are diabetic, but only take blood sugar pills (oral hypoglycemic), then do not take them on the morning of your procedure.  You may take them after you have had the procedure.  Do not take aspirin or any aspirin-containing medications, at least eleven (11) days prior to the procedure.  They may prolong bleeding.  Wear loose fitting clothing that may be easy to  take off and that you would not mind if it got stained with Betadine or blood.  Do not wear any jewelry or perfume  Remove any nail coloring.  It will interfere with some of our monitoring equipment.  NOTE: Remember that this is not meant to be interpreted as a complete list of all possible complications.  Unforeseen problems may occur.  BLOOD THINNERS The following drugs contain aspirin or other products, which can cause increased bleeding during surgery and should not be taken for 2 weeks prior to and 1 week after surgery.  If you should need take something for relief of minor pain, you may take acetaminophen which is found in Tylenol,m Datril, Anacin-3 and Panadol. It is not blood thinner. The products listed below are.  Do not take any of the products listed below in addition to any listed on your instruction sheet.  A.P.C or A.P.C with Codeine Codeine Phosphate Capsules #3 Ibuprofen Ridaura  ABC compound Congesprin Imuran rimadil  Advil Cope Indocin Robaxisal  Alka-Seltzer Effervescent Pain Reliever and Antacid Coricidin or Coricidin-D  Indomethacin Rufen  Alka-Seltzer plus Cold Medicine Cosprin Ketoprofen S-A-C Tablets  Anacin Analgesic Tablets or Capsules Coumadin Korlgesic Salflex  Anacin Extra Strength Analgesic tablets or capsules CP-2 Tablets Lanoril Salicylate  Anaprox Cuprimine Capsules Levenox Salocol  Anexsia-D Dalteparin Magan Salsalate  Anodynos Darvon compound Magnesium Salicylate Sine-off  Ansaid Dasin Capsules Magsal Sodium Salicylate  Anturane Depen Capsules Marnal Soma  APF Arthritis pain formula Dewitt's Pills Measurin Stanback  Argesic Dia-Gesic Meclofenamic Sulfinpyrazone  Arthritis Bayer Timed Release Aspirin Diclofenac Meclomen Sulindac  Arthritis pain formula Anacin Dicumarol Medipren Supac  Analgesic (Safety coated) Arthralgen Diffunasal Mefanamic Suprofen  Arthritis Strength Bufferin Dihydrocodeine Mepro Compound Suprol  Arthropan liquid Dopirydamole  Methcarbomol with Aspirin Synalgos  ASA tablets/Enseals Disalcid Micrainin Tagament  Ascriptin Doan's Midol Talwin  Ascriptin A/D Dolene Mobidin Tanderil  Ascriptin Extra Strength Dolobid Moblgesic Ticlid  Ascriptin with Codeine Doloprin or Doloprin with Codeine Momentum Tolectin  Asperbuf Duoprin Mono-gesic Trendar  Aspergum Duradyne Motrin or Motrin IB Triminicin  Aspirin plain, buffered or enteric coated Durasal Myochrisine Trigesic  Aspirin Suppositories Easprin Nalfon Trillsate  Aspirin with Codeine Ecotrin Regular or Extra Strength Naprosyn Uracel  Atromid-S Efficin Naproxen Ursinus  Auranofin Capsules Elmiron Neocylate Vanquish  Axotal Emagrin Norgesic Verin  Azathioprine Empirin or Empirin with Codeine Normiflo Vitamin E  Azolid Emprazil Nuprin Voltaren  Bayer Aspirin plain, buffered or children's or timed BC Tablets or powders  Encaprin Orgaran Warfarin Sodium  Buff-a-Comp Enoxaparin Orudis Zorpin  Buff-a-Comp with Codeine Equegesic Os-Cal-Gesic   Buffaprin Excedrin plain, buffered or Extra Strength Oxalid   Bufferin Arthritis Strength Feldene Oxphenbutazone   Bufferin plain or Extra Strength Feldene Capsules Oxycodone with Aspirin   Bufferin with Codeine Fenoprofen Fenoprofen Pabalate or Pabalate-SF   Buffets II Flogesic Panagesic   Buffinol plain or Extra Strength Florinal or Florinal with Codeine Panwarfarin   Buf-Tabs Flurbiprofen Penicillamine   Butalbital Compound Four-way cold tablets Penicillin   Butazolidin Fragmin Pepto-Bismol   Carbenicillin Geminisyn Percodan   Carna Arthritis Reliever Geopen Persantine   Carprofen Gold's salt Persistin   Chloramphenicol Goody's Phenylbutazone   Chloromycetin Haltrain Piroxlcam   Clmetidine heparin Plaquenil   Cllnoril Hyco-pap Ponstel   Clofibrate Hydroxy chloroquine Propoxyphen         Before stopping any of these medications, be sure to consult the physician who ordered them.  Some, such as Coumadin (Warfarin) are ordered  to prevent or treat serious conditions such as "deep thrombosis", "pumonary embolisms", and other heart problems.  The amount of time that you may need off of the medication may also vary with the medication and the reason for which you were taking it.  If you are taking any of these medications, please make sure you notify your pain physician before you undergo any procedures.

## 2018-09-02 ENCOUNTER — Ambulatory Visit: Payer: Medicare Other | Admitting: Cardiovascular Disease

## 2018-09-02 NOTE — Progress Notes (Signed)
Procedure sched for 11/6. Pt has INR check & appt w/ TG on 10/28.

## 2018-09-09 ENCOUNTER — Telehealth: Payer: Self-pay | Admitting: Cardiovascular Disease

## 2018-09-09 NOTE — Telephone Encounter (Signed)
I spoke with pt this morning and Pt confirmed he is taking clonidine 0.1 mg tablet -Takes 2 tablets BID.  Pt last OV note mentioned as followed: Medication Instructions:  Your physician has recommended you make the following change in your medication:  1. STAY on Clonidine 0.1 mg twice a day 2. STAY Metoprolol 25 mg 1/2 tablet once daily 3. HOLD Losartan 4. HOLD Olmesartan    Please advise

## 2018-09-09 NOTE — Telephone Encounter (Signed)
°*  STAT* If patient is at the pharmacy, call can be transferred to refill team.   1. Which medications need to be refilled? (please list name of each medication and dose if known)  cloNIDine 0.1 - 2 tablets 2 times daily  2. Which pharmacy/location (including street and city if local pharmacy) is medication to be sent to? [facility]  3. Do they need a 30 day or 90 day supply? 90 day

## 2018-09-10 NOTE — Telephone Encounter (Signed)
S/w patient. When he was here in April, Dr Mariah Milling wanted patient to decrease clonidine to 0.1 mg (1 tablet) by mouth two times a day. Patient states his bottle says to take 2 tablets by mouth two times a day so that is how he's been taking it. Patient has appointment coming up on 09/21/18 with Dr Mariah Milling. Advised him to take clonidine 0.1 mg (1 tablet) two times a day at this time and then Dr Mariah Milling can re-evaluate at appointment. Patient verbalized understanding. Rx for clonidine sent to [facility].

## 2018-09-14 ENCOUNTER — Other Ambulatory Visit: Payer: Self-pay | Admitting: Neurology

## 2018-09-16 ENCOUNTER — Other Ambulatory Visit: Payer: Self-pay | Admitting: *Deleted

## 2018-09-16 ENCOUNTER — Telehealth: Payer: Self-pay | Admitting: Cardiovascular Disease

## 2018-09-16 MED ORDER — ISOSORBIDE MONONITRATE ER 60 MG PO TB24: 60.0000 mg | ORAL_TABLET | Freq: Two times a day (BID) | ORAL | 0 refills | Status: DC

## 2018-09-16 NOTE — Telephone Encounter (Signed)
Requested Prescriptions   Signed Prescriptions Disp Refills  . isosorbide mononitrate (IMDUR) 60 MG 24 hr tablet 20 tablet 0    Sig: Take 1 tablet (60 mg total) by mouth 2 (two) times daily.    Authorizing Provider: Antonieta Iba    Ordering User: Kendrick Fries

## 2018-09-16 NOTE — Telephone Encounter (Signed)
°*  STAT* If patient is at the pharmacy, call can be transferred to refill team.   1. Which medications need to be refilled? (please list name of each medication and dose if known) Isosorbide   2. Which pharmacy/location (including street and city if local pharmacy) is medication to be sent to? [facility] on [address]  3. Do they need a 30 day or 90 day supply? 10 days

## 2018-09-20 NOTE — Progress Notes (Signed)
Cardiology Office Note  Date:  09/21/2018   ID:  [name], DOB 1931-11-23, MRN [id number]  PCP:  Gracelyn Nurse, MD   Chief Complaint  Patient presents with  . other    6 month f/u c/o edema legs. Meds reviewed verbally with pt.    HPI:  84 year old male with  h/o aortic stenosis  s/p St. Jude mechanical aortic valve replacement in 2002 on chronic Coumadin therapy,  mild CAD by cardiac cath in 2011 managed medically,  GERD,  HTN,  carotid bruits,  Chronic hyponatremia asymptomatic bradycardia,  RLS, anxiety, depression,  GI bleed OSA on CPAP osteoarthritis s/p total left hip replacement on 03/21/2015 who was discharged from [facility] the above surgery with a hgb of 8 (baseline 12), he was started on po iron replacement therapy, who presented to on 04/02/15 with acute onset of weakness, melena, BRBPR, and was found to have a hgb of 5.1. Aspirin and warfarin initially held, restarted at a later date, S/p EGD Ejection fraction greater than 55% March 2018, moderately elevated right heart pressures He presents today for follow-up of his aortic valve replacement And hypertension  In follow-up today reports that he needs to have procedures on his knees Needs to come off warfarin Ambulating with a walker, no recent falls  No shortness of breath, no fluid retention  Prior echocardiogram April 2018 Previous Echocardiogram shows normal LV function, well-functioning aortic valve, Moderately elevated right heart pressures with severe TR  Discussed EKG with him again showing atrial flutter as documented in early 2019 Does not feel that the rhythm is affecting him   some regular exercise, water aerobics, sparingly walks in his hallway outside his apartment Sits hours at a time, Crosswords   Tolerating his CPAP Better sleep, less daytime somnolence   EKG performed in the office today showing atrial flutter ventricular rate 72 bpm, nonspecific ST abnormality Personally  reviewed by myself  Other past medical history reviewed  was in the ED 08/16/17 due to HTN. Patient was treated and released.  Blood pressure was 215/100  Recently seen in the emergency room Twice for hypertension On a prior office visit I recommended he increase isosorbide to 30 mg twice a day Also was recommended to double his Diovan dose He was given 1 dose clonidine in the emergency room On a follow-up visit with ryan Dunn it was recommended he increase isosorbide to 30 mg 3 times a day, increase metoprolol up to 50 mg daily He showed up and began in the emergency room several days later  Not on hydralazine secondary to side effects Clonidine was fatigue dry mouth Prior notes indicating amlodipine 10 mg caused leg swelling,  Though he reports stopping the amlodipine did not change his swelling  48 hour Holter monitor 12/2015 Normal sinus rhythm Average heart beat 70 bpm, maximum heart rate 114 bpm, minimum 45 bpm (at 3 AM) Frequent APCs, couplets, bigeminy's with short runs, longest run of 5 beats (12% of all beats)  Previous cardiac catheterization report from 2011 reviewed with him showing 20% proximal LAD, 40% mid LAD disease  PMH:   has a past medical history of Anxiety (10/11), Aortic stenosis, Bradycardia, C. difficile colitis, Carotid bruit, Chronic diastolic CHF (congestive heart failure) (HCC), Coronary artery disease, Decreased hearing, Depression, Gastric ulcer, GERD (gastroesophageal reflux disease), Hypertension, RLS (restless legs syndrome) (08/23/2015), S/P AVR, and SOB (shortness of breath) (10/11).  PSH:    Past Surgical History:  Procedure Laterality Date  . CARDIAC  CATHETERIZATION    . ESOPHAGOGASTRODUODENOSCOPY N/A 04/05/2015   Procedure: ESOPHAGOGASTRODUODENOSCOPY (EGD);  Surgeon: Scot Jun, MD;  Location: [facility] ENDOSCOPY;  Service: Endoscopy;  Laterality: N/A;  . ESOPHAGOGASTRODUODENOSCOPY N/A 04/17/2015   Procedure: ESOPHAGOGASTRODUODENOSCOPY (EGD);   Surgeon: Scot Jun, MD;  Location: [facility] ENDOSCOPY;  Service: Endoscopy;  Laterality: N/A;  . ESOPHAGOGASTRODUODENOSCOPY N/A 08/02/2015   Procedure: ESOPHAGOGASTRODUODENOSCOPY (EGD);  Surgeon: Wallace Cullens, MD;  Location: [facility] ENDOSCOPY;  Service: Endoscopy;  Laterality: N/A;  . HERNIA REPAIR    . JOINT REPLACEMENT    . TOTAL HIP ARTHROPLASTY    . VALVE REPLACEMENT  1/02   Aortic; echo 3/09 valve working well; echo 10/11 working well; put on Coumadin    Current Outpatient Medications on File Prior to Visit  Medication Sig Dispense Refill  . Cetirizine HCl 10 MG CAPS Take 10 mg by mouth daily.    . citalopram (CELEXA) 20 MG tablet Take 20 mg by mouth daily.    . cloNIDine (CATAPRES) 0.1 MG tablet Take 1 tablet (0.1 mg total) by mouth 2 (two) times daily. 180 tablet 3  . doxazosin (CARDURA) 8 MG tablet TAKE 1 TABLET BY MOUTH  TWICE A DAY 180 tablet 1  . finasteride (PROSCAR) 5 MG tablet Take 5 mg by mouth daily.      . furosemide (LASIX) 20 MG tablet TAKE 2 TABLETS BY MOUTH TWO TIMES DAILY 360 tablet 3  . isosorbide mononitrate (IMDUR) 60 MG 24 hr tablet Take 1 tablet (60 mg total) by mouth 2 (two) times daily. 20 tablet 0  . metoprolol succinate (TOPROL-XL) 25 MG 24 hr tablet Take 0.5 tablets (12.5 mg total) by mouth daily. (Patient taking differently: Take 25 mg by mouth daily. ) 45 tablet 3  . potassium chloride (K-DUR) 10 MEQ tablet Take 1 tablet (10 mEq total) by mouth daily. 90 tablet 3  . ranitidine (ZANTAC) 150 MG tablet Take 150 mg by mouth as needed.    Marland Kitchen rOPINIRole (REQUIP) 2 MG tablet TAKE 1 TABLET BY MOUTH 4  TIMES DAILY 360 tablet 3  . Tamsulosin HCl (FLOMAX) 0.4 MG CAPS Take 0.4 mg by mouth at bedtime.     . traZODone (DESYREL) 50 MG tablet Take 50 mg by mouth at bedtime.    Marland Kitchen warfarin (COUMADIN) 5 MG tablet TAKE AS DIRECTED BY  COUMADIN CLINIC 150 tablet 1   No current facility-administered medications on file prior to visit.     Allergies:   Levofloxacin and Sulfa  antibiotics   Social History:  The patient  reports that he quit smoking about 40 years ago. His smoking use included cigarettes. He has never used smokeless tobacco. He reports that he drinks about 3.0 standard drinks of alcohol per week. He reports that he does not use drugs.   Family History:   family history includes Diabetes type II in his mother; Heart attack in his mother; Heart disease in his father and mother; Hypertension in his father, mother, and unknown relative; Stroke in his brother, brother, and father.    Review of Systems: Review of Systems  Constitutional: Negative.   Respiratory: Negative.   Cardiovascular: Positive for leg swelling.  Gastrointestinal: Negative.        Abdominal bloating  Musculoskeletal: Negative.   Neurological: Positive for dizziness.  Psychiatric/Behavioral: Negative.   All other systems reviewed and are negative.    PHYSICAL EXAM: VS:  BP 134/74 (BP Location: Left Arm, Patient Position: Sitting, Cuff Size: Normal)   Pulse 72  Ht 5\' 11"  (1.803 m)   Wt 211 lb 8 oz (95.9 kg)   BMI 29.50 kg/m  , BMI Body mass index is 29.5 kg/m.  No significant change in exam Constitutional:  oriented to person, place, and time. No distress.  HENT:  Head: Normocephalic and atraumatic.  Eyes:  no discharge. No scleral icterus.  Neck: Normal range of motion. Neck supple. No JVD present.  Cardiovascular: Normal rate, regular rhythm, normal heart sounds and intact distal pulses. Exam reveals no gallop and no friction rub.  Trace lower extremity edema bilaterally No murmur heard. Pulmonary/Chest: Effort normal and breath sounds normal. No stridor. No respiratory distress.  no wheezes.  no rales.  no tenderness.  Abdominal: Soft.  no distension.  no tenderness.  Musculoskeletal: Normal range of motion.  no  tenderness or deformity.  Neurological: Grossly nonfocal Skin: Skin is warm and dry. No rash noted. not diaphoretic.  Psychiatric:  normal mood and  affect. behavior is normal. Thought content normal.    Recent Labs: 02/05/2018: Hemoglobin 13.2; Platelets 192    Lipid Panel No results found for: CHOL, HDL, LDLCALC, TRIG    Wt Readings from Last 3 Encounters:  12/09/16 200 lb (90.7 kg)  11/08/16 183 lb (83 kg) (wrong weight?)  10/15/16 202 lb 4 oz (91.7 kg)   Weight  08/13/2017 is 217 pounds Weight September 26, 2017 is 211 pounds   ASSESSMENT AND PLAN:  Essential hypertension - Blood pressure is well controlled on today's visit. No changes made to the medications. Stable  Coronary artery disease involving native coronary artery of native heart without angina pectoris - Plan: EKG 12-Lead Stable, denies any anginal symptoms  Atrial flutter, typical On anticoagulation, rate well controlled Long discussion with him whether to restore normal sinus rhythm As on his previous office visit reports that he did not notice any symptoms We will continue to monitor for now  S/P AVR (aortic valve replacement) Well-functioning aortic valve in early 2018 Tolerating anticoagulation  echocardiogram ordered to evaluate mechanical valve He will bridge his warfarin with Lovenox for upcoming knee procedure  SOB (shortness of breath) Taking Lasix daily, denies having significant shortness of breath  chronic, stable Repeat echocardiogram ordered given elevated right heart pressures/pulmonary hypertension  Lower extremity edema Currently takes Lasix 40 daily with potassium Suggested he look for compression hose Had an old pair but they were too tight   Total encounter time more than 25 minutes  Greater than 50% was spent in counseling and coordination of care with the patient  Disposition:   F/U  6 months   Orders Placed This Encounter  Procedures  . EKG 12-Lead     Signed, Dossie Arbour, M.D., Ph.D. 09/21/2018  [facility] [place], [place] [phone]

## 2018-09-21 ENCOUNTER — Ambulatory Visit (INDEPENDENT_AMBULATORY_CARE_PROVIDER_SITE_OTHER): Payer: Medicare Other

## 2018-09-21 ENCOUNTER — Ambulatory Visit: Payer: Medicare Other | Admitting: Podiatry

## 2018-09-21 ENCOUNTER — Ambulatory Visit: Payer: Medicare Other | Admitting: Cardiovascular Disease

## 2018-09-21 ENCOUNTER — Encounter: Payer: Self-pay | Admitting: Cardiovascular Disease

## 2018-09-21 VITALS — BP 134/74 | HR 72 | Ht 71.0 in | Wt 211.5 lb

## 2018-09-21 DIAGNOSIS — Z952 Presence of prosthetic heart valve: Secondary | ICD-10-CM

## 2018-09-21 DIAGNOSIS — I350 Nonrheumatic aortic (valve) stenosis: Secondary | ICD-10-CM

## 2018-09-21 DIAGNOSIS — I5032 Chronic diastolic (congestive) heart failure: Secondary | ICD-10-CM | POA: Diagnosis not present

## 2018-09-21 DIAGNOSIS — I25118 Atherosclerotic heart disease of native coronary artery with other forms of angina pectoris: Secondary | ICD-10-CM | POA: Diagnosis not present

## 2018-09-21 DIAGNOSIS — Z7901 Long term (current) use of anticoagulants: Secondary | ICD-10-CM

## 2018-09-21 DIAGNOSIS — I4892 Unspecified atrial flutter: Secondary | ICD-10-CM

## 2018-09-21 DIAGNOSIS — I890 Lymphedema, not elsewhere classified: Secondary | ICD-10-CM | POA: Diagnosis not present

## 2018-09-21 DIAGNOSIS — G4733 Obstructive sleep apnea (adult) (pediatric): Secondary | ICD-10-CM

## 2018-09-21 DIAGNOSIS — I10 Essential (primary) hypertension: Secondary | ICD-10-CM

## 2018-09-21 LAB — POCT INR: INR: 3 (ref 2.0–3.0)

## 2018-09-21 MED ORDER — METOPROLOL SUCCINATE ER 25 MG PO TB24: 25.0000 mg | ORAL_TABLET | Freq: Every day | ORAL | 3 refills | Status: DC

## 2018-09-21 MED ORDER — FUROSEMIDE 40 MG PO TABS: 40.0000 mg | ORAL_TABLET | Freq: Every day | ORAL | 3 refills | Status: DC

## 2018-09-21 MED ORDER — ENOXAPARIN SODIUM 100 MG/ML ~~LOC~~ SOLN: 100.0000 mg | Freq: Two times a day (BID) | SUBCUTANEOUS | 1 refills | Status: DC

## 2018-09-21 NOTE — Patient Instructions (Addendum)
Medication Instructions:  No changes  If you need a refill on your cardiac medications before your next appointment, please call your pharmacy.    Lab work: No new labs needed   If you have labs (blood work) drawn today and your tests are completely normal, you will receive your results only by: Marland Kitchen MyChart Message (if you have MyChart) OR . A paper copy in the mail If you have any lab test that is abnormal or we need to change your treatment, we will call you to review the results.   Testing/Procedures: We will schedule an echo for atrial flutter, mechanical aortic valve  Echocardiography is a painless test that uses sound waves to create images of your heart. It provides your doctor with information about the size and shape of your heart and how well your heart's chambers and valves are working. This procedure takes approximately one hour. There are no restrictions for this procedure. Please make sure that you do come well hydrated to the test as we occasionally have to start an IV to inject an image enhancer to obtain more optimal pictures of the heart.  Follow-Up: At [facility], you and your health needs are our priority.  As part of our continuing mission to provide you with exceptional heart care, we have created designated Provider Care Teams.  These Care Teams include your primary Cardiologist (physician) and Advanced Practice Providers (APPs -  Physician Assistants and Nurse Practitioners) who all work together to provide you with the care you need, when you need it. . You will need a follow up appointment in 6 months  .   Please call our office 2 months in advance to schedule this appointment.    . Providers on your designated Care Team:   . Nicolasa Ducking, NP . Eula Listen, PA-C . [name], PA-C  Any Other Special Instructions Will Be Listed Below (If Applicable).  For educational health videos Log in to : [email or web address] Or : [email or web address], password :  [email or web address]

## 2018-09-21 NOTE — Patient Instructions (Addendum)
Thurs 10/31: Last dose of Coumadin.  Fri 11/1: No Coumadin or Lovenox.  Sat 11/2: Inject Lovenox 100mg  in the fatty abdominal tissue at least 2 inches from the belly button twice a day about 12 hours apart, 8am and 8pm rotate sites. No Coumadin.  Wynelle Link 11/3: Inject Lovenox in the fatty tissue every 12 hours, 8am and 8pm. No Coumadin.  Mon 11/4: Inject Lovenox in the fatty tissue every 12 hours, 8am and 8pm. No Coumadin.  Tues 11/5: Inject Lovenox in the fatty tissue in the morning at 8 am (No PM dose). No Coumadin.  Wed 11/6: Procedure Day - No Lovenox - Resume Coumadin in the evening or as directed by doctor (take an extra half tablet with usual dose for 2 days then resume normal dose).  Thurs 11/7: Resume Lovenox inject in the fatty tissue every 12 hours and take Coumadin.  Fri 11/8: Inject Lovenox in the fatty tissue every 12 hours and take Coumadin.  Sat 11/9: Inject Lovenox in the fatty tissue every 12 hours and take Coumadin.  Sun 11/10: Inject Lovenox in the fatty tissue every 12 hours and take Coumadin.  Mon 11/11: Inject Lovenox in the fatty tissue every 12 hours and take Coumadin. Coumadin appt to check INR.

## 2018-09-30 ENCOUNTER — Other Ambulatory Visit: Payer: Self-pay

## 2018-09-30 ENCOUNTER — Ambulatory Visit (HOSPITAL_BASED_OUTPATIENT_CLINIC_OR_DEPARTMENT_OTHER): Payer: Medicare Other | Admitting: Student in an Organized Health Care Education/Training Program

## 2018-09-30 ENCOUNTER — Ambulatory Visit
Admission: RE | Admit: 2018-09-30 | Discharge: 2018-09-30 | Disposition: A | Discharge status: Home / Self Care | Payer: Medicare Other | Source: Ambulatory Visit | Attending: Student in an Organized Health Care Education/Training Program | Admitting: Student in an Organized Health Care Education/Training Program

## 2018-09-30 ENCOUNTER — Encounter: Payer: Self-pay | Admitting: Student in an Organized Health Care Education/Training Program

## 2018-09-30 VITALS — BP 136/93 | HR 70 | Temp 97.7°F | Resp 11 | Ht 71.0 in | Wt 205.0 lb

## 2018-09-30 DIAGNOSIS — G8929 Other chronic pain: Secondary | ICD-10-CM | POA: Diagnosis not present

## 2018-09-30 DIAGNOSIS — Z9889 Other specified postprocedural states: Secondary | ICD-10-CM | POA: Insufficient documentation

## 2018-09-30 DIAGNOSIS — M25562 Pain in left knee: Secondary | ICD-10-CM | POA: Insufficient documentation

## 2018-09-30 DIAGNOSIS — Z882 Allergy status to sulfonamides status: Secondary | ICD-10-CM | POA: Insufficient documentation

## 2018-09-30 DIAGNOSIS — M1712 Unilateral primary osteoarthritis, left knee: Secondary | ICD-10-CM | POA: Diagnosis not present

## 2018-09-30 DIAGNOSIS — Z952 Presence of prosthetic heart valve: Secondary | ICD-10-CM | POA: Diagnosis not present

## 2018-09-30 MED ORDER — LIDOCAINE HCL 2 % IJ SOLN
INTRAMUSCULAR | Status: AC
  Filled 2018-09-30: qty 20

## 2018-09-30 MED ORDER — LIDOCAINE HCL 2 % IJ SOLN
20.0000 mL | Freq: Once | INTRAMUSCULAR | Status: AC
  Administered 2018-09-30: 400 mg

## 2018-09-30 MED ORDER — DEXAMETHASONE SODIUM PHOSPHATE 10 MG/ML IJ SOLN
10.0000 mg | Freq: Once | INTRAMUSCULAR | Status: AC
  Administered 2018-09-30: 10 mg

## 2018-09-30 MED ORDER — ROPIVACAINE HCL 2 MG/ML IJ SOLN
10.0000 mL | Freq: Once | INTRAMUSCULAR | Status: AC
  Administered 2018-09-30: 10 mL

## 2018-09-30 MED ORDER — FENTANYL CITRATE (PF) 100 MCG/2ML IJ SOLN
25.0000 ug | INTRAMUSCULAR | Status: DC | PRN
  Administered 2018-09-30: 25 ug via INTRAVENOUS

## 2018-09-30 MED ORDER — FENTANYL CITRATE (PF) 100 MCG/2ML IJ SOLN
INTRAMUSCULAR | Status: AC
  Filled 2018-09-30: qty 2

## 2018-09-30 MED ORDER — ROPIVACAINE HCL 2 MG/ML IJ SOLN
INTRAMUSCULAR | Status: AC
  Filled 2018-09-30: qty 10

## 2018-09-30 MED ORDER — DEXAMETHASONE SODIUM PHOSPHATE 10 MG/ML IJ SOLN
INTRAMUSCULAR | Status: AC
Start: 2018-09-30 — End: 2018-09-30
  Filled 2018-09-30: qty 1

## 2018-09-30 MED ORDER — LACTATED RINGERS IV SOLN
1000.0000 mL | Freq: Once | INTRAVENOUS | Status: AC
  Administered 2018-09-30: 1000 mL via INTRAVENOUS

## 2018-09-30 NOTE — Progress Notes (Signed)
Safety precautions to be maintained throughout the outpatient stay will include: orient to surroundings, keep bed in low position, maintain call bell within reach at all times, provide assistance with transfer out of bed and ambulation.  

## 2018-09-30 NOTE — Progress Notes (Signed)
Patient's Name: [name]  MRN: [id number]  Referring Provider: Gracelyn Nurse, MD  DOB: 03-28-1931  PCP: Gracelyn Nurse, MD  DOS: 09/30/2018  Note by: Edward Jolly, MD  Service setting: Ambulatory outpatient  Specialty: Interventional Pain Management  Patient type: Established  Location: [facility] (AMB) Pain Management Facility  Visit type: Interventional Procedure   Primary Reason for Visit: Interventional Pain Management Treatment. CC: Knee Pain (left)  Procedure:          Anesthesia, Analgesia, Anxiolysis:  Type: Genicular Nerves Block (Superior-lateral, Superior-medial, and Inferior-medial Genicular Nerves) #2  CPT: 64450      Primary Purpose: Diagnostic Region: Lateral, Anterior, and Medial aspects of the knee joint, above and below the knee joint proper. Level: Superior and inferior to the knee joint. Target Area: For Genicular Nerve block(s), the targets are: the superior-lateral genicular nerve, located in the lateral distal portion of the femoral shaft as it curves to form the lateral epicondyle, in the region of the distal femoral metaphysis; the superior-medial genicular nerve, located in the medial distal portion of the femoral shaft as it curves to form the medial epicondyle; and the inferior-medial genicular nerve, located in the medial, proximal portion of the tibial shaft, as it curves to form the medial epicondyle, in the region of the proximal tibial metaphysis. Approach: Anterior, percutaneous, ipsilateral approach. Laterality: Left knee Position: Modified Fowler's position with pillows under the targeted knee(s).  Type: Moderate (Conscious) Sedation combined with Local Anesthesia Indication(s): Analgesia and Anxiety Route: Intravenous (IV) IV Access: Secured Sedation: Meaningful verbal contact was maintained at all times during the procedure  Local Anesthetic: Lidocaine 1-2%   Indications: 1. Primary osteoarthritis of left knee   2. Chronic pain of left knee     3. S/P AVR (aortic valve replacement)    Pain Score: Pre-procedure: 3 /10 Post-procedure: 0-No pain/10  Pt here for LEFT knee genicular nerve block #2. Obtained cardiac clearance to stop Coumadin and bridge with Lovenox. Last dose of Lovenox yesterday at 8 am. No Lovenox today.   Pre-op Assessment:  [name] is a 84 y.o. (year old), male patient, seen today for interventional treatment. He  has a past surgical history that includes Valve replacement (1/02); Cardiac catheterization; Joint replacement; Hernia repair; Esophagogastroduodenoscopy (N/A, 04/05/2015); Esophagogastroduodenoscopy (N/A, 04/17/2015); Esophagogastroduodenoscopy (N/A, 08/02/2015); and Total hip arthroplasty. [name] has a current medication list which includes the following prescription(s): cetirizine hcl, citalopram, clonidine, doxazosin, enoxaparin, finasteride, furosemide, isosorbide mononitrate, metoprolol succinate, potassium chloride, ranitidine, ropinirole, tamsulosin, trazodone, and warfarin, and the following Facility-Administered Medications: fentanyl and lactated ringers. His primarily concern today is the Knee Pain (left)  Initial Vital Signs:  Pulse/HCG Rate: 70ECG Heart Rate: 69 Temp: 97.7 F (36.5 C) Resp: 18 BP: (!) 155/64 SpO2: 98 %  BMI: Estimated body mass index is 28.59 kg/m as calculated from the following:   Height as of this encounter: 5\' 11"  (1.803 m).   Weight as of this encounter: 205 lb (93 kg).  Risk Assessment: Allergies: Reviewed. He is allergic to levofloxacin and sulfa antibiotics.  Allergy Precautions: None required Coagulopathies: Reviewed. None identified.  Blood-thinner therapy: None at this time Active Infection(s): Reviewed. None identified. [name] is afebrile  Site Confirmation: [name] was asked to confirm the procedure and laterality before marking the site Procedure checklist: Completed Consent: Before the procedure and under the influence of no sedative(s),  amnesic(s), or anxiolytics, the patient was informed of the treatment options, risks and possible complications. To fulfill our ethical  and legal obligations, as recommended by the American Medical Association's Code of Ethics, I have informed the patient of my clinical impression; the nature and purpose of the treatment or procedure; the risks, benefits, and possible complications of the intervention; the alternatives, including doing nothing; the risk(s) and benefit(s) of the alternative treatment(s) or procedure(s); and the risk(s) and benefit(s) of doing nothing. The patient was provided information about the general risks and possible complications associated with the procedure. These may include, but are not limited to: failure to achieve desired goals, infection, bleeding, organ or nerve damage, allergic reactions, paralysis, and death. In addition, the patient was informed of those risks and complications associated to the procedure, such as failure to decrease pain; infection; bleeding; organ or nerve damage with subsequent damage to sensory, motor, and/or autonomic systems, resulting in permanent pain, numbness, and/or weakness of one or several areas of the body; allergic reactions; (i.e.: anaphylactic reaction); and/or death. Furthermore, the patient was informed of those risks and complications associated with the medications. These include, but are not limited to: allergic reactions (i.e.: anaphylactic or anaphylactoid reaction(s)); adrenal axis suppression; blood sugar elevation that in diabetics may result in ketoacidosis or comma; water retention that in patients with history of congestive heart failure may result in shortness of breath, pulmonary edema, and decompensation with resultant heart failure; weight gain; swelling or edema; medication-induced neural toxicity; particulate matter embolism and blood vessel occlusion with resultant organ, and/or nervous system infarction; and/or aseptic  necrosis of one or more joints. Finally, the patient was informed that Medicine is not an exact science; therefore, there is also the possibility of unforeseen or unpredictable risks and/or possible complications that may result in a catastrophic outcome. The patient indicated having understood very clearly. We have given the patient no guarantees and we have made no promises. Enough time was given to the patient to ask questions, all of which were answered to the patient's satisfaction. [name] has indicated that he wanted to continue with the procedure. Attestation: I, the ordering provider, attest that I have discussed with the patient the benefits, risks, side-effects, alternatives, likelihood of achieving goals, and potential problems during recovery for the procedure that I have provided informed consent. Date  Time: 09/30/2018 10:31 AM  Pre-Procedure Preparation:  Monitoring: As per clinic protocol. Respiration, ETCO2, SpO2, BP, heart rate and rhythm monitor placed and checked for adequate function Safety Precautions: Patient was assessed for positional comfort and pressure points before starting the procedure. Time-out: I initiated and conducted the "Time-out" before starting the procedure, as per protocol. The patient was asked to participate by confirming the accuracy of the "Time Out" information. Verification of the correct person, site, and procedure were performed and confirmed by me, the nursing staff, and the patient. "Time-out" conducted as per Joint Commission's Universal Protocol (UP.01.01.01). Time: 1124  Description of Procedure:          Area Prepped: Entire knee area, from mid-thigh to mid-shin, lateral, anterior, and medial aspects. Prepping solution: ChloraPrep (2% chlorhexidine gluconate and 70% isopropyl alcohol) Safety Precautions: Aspiration looking for blood return was conducted prior to all injections. At no point did we inject any substances, as a needle was being  advanced. No attempts were made at seeking any paresthesias. Safe injection practices and needle disposal techniques used. Medications properly checked for expiration dates. SDV (single dose vial) medications used. Latex Allergy precautions taken.   Description of the Procedure: Protocol guidelines were followed. The patient was placed in position over the  procedure table. The target area was identified and the area prepped in the usual manner. Skin & deeper tissues infiltrated with local anesthetic. Appropriate amount of time allowed to pass for local anesthetics to take effect. The procedure needles were then advanced to the target area. Proper needle placement secured. Negative aspiration confirmed. Solution injected in intermittent fashion, asking for systemic symptoms every 0.5cc of injectate. The needles were then removed and the area cleansed, making sure to leave some of the prepping solution back to take advantage of its long term bactericidal properties.  Vitals:   09/30/18 1130 09/30/18 1138 09/30/18 1150 09/30/18 1159  BP: (!) 144/86 (!) 159/83 (!) 136/93   Pulse:      Resp: 11 14 12 11   Temp:      TempSrc:      SpO2: 95% 95% 100% 100%  Weight:      Height:        Start Time: 1124 hrs. End Time: 1130 hrs. Materials:  Needle(s) Type: Regular needle Gauge: 22G Length: 3.5-in Medication(s): Please see orders for medications and dosing details. 6 cc solution made a 5 cc of 0.2% ropivacaine, 1 cc of Decadron 10 mg/cc.  2 cc injected at each level above for the left knee. Imaging Guidance (Non-Spinal):          Type of Imaging Technique: Fluoroscopy Guidance (Non-Spinal) Indication(s): Assistance in needle guidance and placement for procedures requiring needle placement in or near specific anatomical locations not easily accessible without such assistance. Exposure Time: Please see nurses notes. Contrast: Before injecting any contrast, we confirmed that the patient did not have an  allergy to iodine, shellfish, or radiological contrast. Once satisfactory needle placement was completed at the desired level, radiological contrast was injected. Contrast injected under live fluoroscopy. No contrast complications. See chart for type and volume of contrast used. Fluoroscopic Guidance: I was personally present during the use of fluoroscopy. "Tunnel Vision Technique" used to obtain the best possible view of the target area. Parallax error corrected before commencing the procedure. "Direction-depth-direction" technique used to introduce the needle under continuous pulsed fluoroscopy. Once target was reached, antero-posterior, oblique, and lateral fluoroscopic projection used confirm needle placement in all planes. Images permanently stored in EMR. Interpretation: I personally interpreted the imaging intraoperatively. Adequate needle placement confirmed in multiple planes. Appropriate spread of contrast into desired area was observed. No evidence of afferent or efferent intravascular uptake. Permanent images saved into the patient's record.  Antibiotic Prophylaxis:   Anti-infectives (From admission, onward)   None     Indication(s): None identified  Post-operative Assessment:  Post-procedure Vital Signs:  Pulse/HCG Rate: 7067 Temp: 97.7 F (36.5 C) Resp: 11 BP: (!) 136/93 SpO2: 100 %  EBL: None  Complications: No immediate post-treatment complications observed by team, or reported by patient.  Note: The patient tolerated the entire procedure well. A repeat set of vitals were taken after the procedure and the patient was kept under observation following institutional policy, for this type of procedure. Post-procedural neurological assessment was performed, showing return to baseline, prior to discharge. The patient was provided with post-procedure discharge instructions, including a section on how to identify potential problems. Should any problems arise concerning this  procedure, the patient was given instructions to immediately contact us, at any time, without hesitation. In any case, we plan to contact the patient by telephone for a follow-up status report regarding this interventional procedure.  Comments:  No additional relevant information.  Plan of Care  Patient to follow-up in  4-5 weeks.  Patient instructed to follow instructions for restarting Coumadin which was provided by the cardiology clinic.  We will also call the patient tomorrow to make sure he is doing well and not having any postprocedural issues.  Imaging Orders     DG C-Arm 1-60 Min-No Report Procedure Orders    No procedure(s) ordered today    Medications ordered for procedure: Meds ordered this encounter  Medications  . lactated ringers infusion 1,000 mL  . fentaNYL (SUBLIMAZE) injection 25-100 mcg    Make sure Narcan is available in the pyxis when using this medication. In the event of respiratory depression (RR< 8/min): Titrate NARCAN (naloxone) in increments of 0.1 to 0.2 mg IV at 2-3 minute intervals, until desired degree of reversal.  . lidocaine (XYLOCAINE) 2 % (with pres) injection 400 mg  . ropivacaine (PF) 2 mg/mL (0.2%) (NAROPIN) injection 10 mL  . dexamethasone (DECADRON) injection 10 mg   Medications administered: We administered lactated ringers, fentaNYL, lidocaine, ropivacaine (PF) 2 mg/mL (0.2%), and dexamethasone.  See the medical record for exact dosing, route, and time of administration.  New Prescriptions   No medications on file   Disposition: Discharge home  Discharge Date & Time: 09/30/2018; 1159 hrs.   Physician-requested Follow-up: Return in about 5 weeks (around 11/07/2018) for Post Procedure Evaluation.  Future Appointments  Date Time Provider Department Center  10/19/2018 11:30 AM Butch Penny, NP [facility] None  11/05/2018 10:00 AM Edward Jolly, MD [facility] None  11/09/2018 11:30 AM [facility] Cower [place] [id number]   Primary Care  Physician: Gracelyn Nurse, MD Location: [facility] Note by: Edward Jolly, MD Date: 09/30/2018; Time: 12:16 PM  Disclaimer:  Medicine is not an exact science. The only guarantee in medicine is that nothing is guaranteed. It is important to note that the decision to proceed with this intervention was based on the information collected from the patient. The Data and conclusions were drawn from the patient's questionnaire, the interview, and the physical examination. Because the information was provided in large part by the patient, it cannot be guaranteed that it has not been purposely or unconsciously manipulated. Every effort has been made to obtain as much relevant data as possible for this evaluation. It is important to note that the conclusions that lead to this procedure are derived in large part from the available data. Always take into account that the treatment will also be dependent on availability of resources and existing treatment guidelines, considered by other Pain Management Practitioners as being common knowledge and practice, at the time of the intervention. For Medico-Legal purposes, it is also important to point out that variation in procedural techniques and pharmacological choices are the acceptable norm. The indications, contraindications, technique, and results of the above procedure should only be interpreted and judged by a Board-Certified Interventional Pain Specialist with extensive familiarity and expertise in the same exact procedure and technique.

## 2018-09-30 NOTE — Patient Instructions (Signed)

## 2018-10-01 ENCOUNTER — Telehealth: Payer: Self-pay | Admitting: *Deleted

## 2018-10-01 NOTE — Telephone Encounter (Signed)
No problems post procedure. 

## 2018-10-05 ENCOUNTER — Ambulatory Visit (INDEPENDENT_AMBULATORY_CARE_PROVIDER_SITE_OTHER): Payer: Medicare Other

## 2018-10-05 DIAGNOSIS — Z952 Presence of prosthetic heart valve: Secondary | ICD-10-CM

## 2018-10-05 DIAGNOSIS — I350 Nonrheumatic aortic (valve) stenosis: Secondary | ICD-10-CM

## 2018-10-05 DIAGNOSIS — Z7901 Long term (current) use of anticoagulants: Secondary | ICD-10-CM | POA: Diagnosis not present

## 2018-10-05 LAB — POCT INR: INR: 1.6 — AB (ref 2.0–3.0)

## 2018-10-05 NOTE — Patient Instructions (Signed)
Please TAKE 2 TABLETS TONIGHT & TOMORROW NIGHT, TAKE LOVENOX TONIGHT, THEN DON'T TAKE TOMORROW, Then resume dosage of 1 tablet every day except 1.5 on Mondays, Wednesdays & Fridays.  Recheck in 1 week.

## 2018-10-06 ENCOUNTER — Other Ambulatory Visit: Payer: Self-pay | Admitting: Cardiovascular Disease

## 2018-10-06 NOTE — Telephone Encounter (Signed)
Refill Request.  

## 2018-10-12 ENCOUNTER — Ambulatory Visit (INDEPENDENT_AMBULATORY_CARE_PROVIDER_SITE_OTHER): Payer: Medicare Other

## 2018-10-12 DIAGNOSIS — Z7901 Long term (current) use of anticoagulants: Secondary | ICD-10-CM

## 2018-10-12 DIAGNOSIS — I350 Nonrheumatic aortic (valve) stenosis: Secondary | ICD-10-CM

## 2018-10-12 DIAGNOSIS — Z952 Presence of prosthetic heart valve: Secondary | ICD-10-CM

## 2018-10-12 LAB — POCT INR: INR: 3.3 — AB (ref 2.0–3.0)

## 2018-10-12 NOTE — Patient Instructions (Signed)
Please continue dosage of 1 tablet every day except 1.5 on Mondays, Wednesdays & Fridays.  Recheck in 2 weeks.   

## 2018-10-19 ENCOUNTER — Ambulatory Visit: Payer: Medicare Other | Admitting: Adult Health

## 2018-10-20 ENCOUNTER — Telehealth: Payer: Self-pay | Admitting: Cardiovascular Disease

## 2018-10-20 ENCOUNTER — Ambulatory Visit: Payer: Medicare Other | Admitting: Adult Health

## 2018-10-20 NOTE — Telephone Encounter (Signed)
Patient calling Patient wants to know if there is a cough syrup that we can send a prescription for or if there are any over the counter cough syrups that we can suggest Please call to discuss

## 2018-10-20 NOTE — Telephone Encounter (Signed)
Call from patient regarding medication he can buy over the counter for cough and cold sx. Left message for patient that Coricidin HBP is safe for patients with HTN hx.  Advised pt to call back with further questions or concerns.

## 2018-10-26 ENCOUNTER — Ambulatory Visit (INDEPENDENT_AMBULATORY_CARE_PROVIDER_SITE_OTHER): Payer: Medicare Other

## 2018-10-26 DIAGNOSIS — Z952 Presence of prosthetic heart valve: Secondary | ICD-10-CM | POA: Diagnosis not present

## 2018-10-26 DIAGNOSIS — Z7901 Long term (current) use of anticoagulants: Secondary | ICD-10-CM

## 2018-10-26 DIAGNOSIS — I350 Nonrheumatic aortic (valve) stenosis: Secondary | ICD-10-CM | POA: Diagnosis not present

## 2018-10-26 LAB — POCT INR: INR: 3.9 — AB (ref 2.0–3.0)

## 2018-10-26 NOTE — Patient Instructions (Signed)
Please skip coumadin tonight, take your regular dose tomorrow, this Wednesday, only take 1 tablet (instead of your regular 1.5), then resume dosage of 1 tablet every day except 1.5 on Mondays, Wednesdays & Fridays.  Recheck in 2 weeks.

## 2018-10-27 ENCOUNTER — Other Ambulatory Visit
Admission: RE | Admit: 2018-10-27 | Discharge: 2018-10-27 | Disposition: A | Discharge status: Home / Self Care | Payer: Medicare Other | Source: Ambulatory Visit | Attending: Student | Admitting: Student

## 2018-10-27 ENCOUNTER — Ambulatory Visit: Payer: Medicare Other | Admitting: Cardiovascular Disease

## 2018-10-27 ENCOUNTER — Encounter: Payer: Self-pay | Admitting: Nurse Practitioner

## 2018-10-27 ENCOUNTER — Ambulatory Visit: Payer: Medicare Other | Admitting: Nurse Practitioner

## 2018-10-27 VITALS — BP 150/88 | HR 78 | Resp 16 | Ht 71.0 in | Wt 214.0 lb

## 2018-10-27 DIAGNOSIS — R0600 Dyspnea, unspecified: Secondary | ICD-10-CM | POA: Insufficient documentation

## 2018-10-27 DIAGNOSIS — I5032 Chronic diastolic (congestive) heart failure: Secondary | ICD-10-CM | POA: Diagnosis not present

## 2018-10-27 DIAGNOSIS — I10 Essential (primary) hypertension: Secondary | ICD-10-CM

## 2018-10-27 DIAGNOSIS — R05 Cough: Secondary | ICD-10-CM | POA: Diagnosis present

## 2018-10-27 DIAGNOSIS — I4821 Permanent atrial fibrillation: Secondary | ICD-10-CM

## 2018-10-27 DIAGNOSIS — Z952 Presence of prosthetic heart valve: Secondary | ICD-10-CM

## 2018-10-27 DIAGNOSIS — I5033 Acute on chronic diastolic (congestive) heart failure: Secondary | ICD-10-CM

## 2018-10-27 LAB — BRAIN NATRIURETIC PEPTIDE: B NATRIURETIC PEPTIDE 5: 467 pg/mL — AB (ref 0.0–100.0)

## 2018-10-27 NOTE — Patient Instructions (Signed)
Medication Instructions:  Your physician has recommended you make the following change in your medication:  1- TAKE Furosemide 40 mg two times a day for 3 days, then back to 40 mg once a day. 2- TAKE Potassium 10 mEq by mouth two times a day for 3 days, the back to 10 mEq once a day.   If you need a refill on your cardiac medications before your next appointment, please call your pharmacy.   Lab work: Your physician recommends that you return for lab work in: TODAY - BMET.   If you have labs (blood work) drawn today and your tests are completely normal, you will receive your results only by: Marland Kitchen. MyChart Message (if you have MyChart) OR . A paper copy in the mail If you have any lab test that is abnormal or we need to change your treatment, we will call you to review the results.  Testing/Procedures:  Please move scheduled appointment for be sooner please Your physician has requested that you have an echocardiogram. Echocardiography is a painless test that uses sound waves to create images of your heart. It provides your doctor with information about the size and shape of your heart and how well your heart's chambers and valves are working. This procedure takes approximately one hour. There are no restrictions for this procedure.    Follow-Up: At [facility], you and your health needs are our priority.  As part of our continuing mission to provide you with exceptional heart care, we have created designated Provider Care Teams.  These Care Teams include your primary Cardiologist (physician) and Advanced Practice Providers (APPs -  Physician Assistants and Nurse Practitioners) who all work together to provide you with the care you need, when you need it. You will need a follow up appointment in 1 weeks.    You may see DR Mariah MillingGollan or Nicolasa Duckinghristopher Berge, NP.

## 2018-10-27 NOTE — Progress Notes (Signed)
Office Visit    Patient Name: Tameron Lama Date of Encounter: 10/27/2018  Primary Care Provider:  Gracelyn Nurse, MD Primary Cardiologist:  Julien Nordmann, MD  Chief Complaint    84 year old male with a history of permanent atrial fibrillation, aortic stenosis status post mechanical AVR on chronic Coumadin, chronic diastolic congestive heart failure, nonobstructive CAD, hypertension, hyperlipidemia, GERD, and chronic dyspnea, who presents for follow-up related to heart failure symptoms.  Past Medical History    Past Medical History:  Diagnosis Date  . Anxiety 10/11  . Aortic stenosis    a. s/p mechcanical AVR, 2002  . Bradycardia    chronic, no symptoms 07/2010  . C. difficile colitis   . Carotid bruit    dopplers in past, no abnormalities  . Chronic diastolic CHF (congestive heart failure) (HCC)    a. echo 03/2015: EF 60-65%, no RWMA, GR1DD, aortic valve poorly visualized, unable to excluded vegetation, increased transvalvular velocity, mechanical valve is present, mild biatrial enalrgement, mild to mod MR, mod TR, PASP 65 mmHg; b. 01/2017 Echo: EF 55-60%, NRWMA, grade 1 diastolic dysfunction.  Normal functioning prosthetic aortic valve.  Mean gradient 50 mmHg.  Sev TR. PASP .  Marland Kitchen Coronary artery disease    a. mild, cath, 08/2010; b. medically managed  . Decreased hearing    Right ear  . Depression   . Gastric ulcer   . GERD (gastroesophageal reflux disease)   . Hypertension    BP higher than usual 04/19/10; amlodipine increased by telephone  . RLS (restless legs syndrome) 08/23/2015  . S/P AVR    a. [employer]. mechanical 2002; b. echo 08/2010 EF 60%, trival AI, mild MR, AVR working well; c. on longterm warfarin tx  . SOB (shortness of breath) 10/11   08/2010,Episodes at 5 AM, eventually felt to be anxiety, after complete workup including catheterization, pt greatly improved with anxiety meds 11/11   Past Surgical History:  Procedure Laterality Date  .  CARDIAC CATHETERIZATION    . ESOPHAGOGASTRODUODENOSCOPY N/A 04/05/2015   Procedure: ESOPHAGOGASTRODUODENOSCOPY (EGD);  Surgeon: Scot Jun, MD;  Location: [facility] [place] ENDOSCOPY;  Service: Endoscopy;  Laterality: N/A;  . ESOPHAGOGASTRODUODENOSCOPY N/A 04/17/2015   Procedure: ESOPHAGOGASTRODUODENOSCOPY (EGD);  Surgeon: Scot Jun, MD;  Location: [facility] ENDOSCOPY;  Service: Endoscopy;  Laterality: N/A;  . ESOPHAGOGASTRODUODENOSCOPY N/A 08/02/2015   Procedure: ESOPHAGOGASTRODUODENOSCOPY (EGD);  Surgeon: Wallace Cullens, MD;  Location: [facility] ENDOSCOPY;  Service: Endoscopy;  Laterality: N/A;  . HERNIA REPAIR    . JOINT REPLACEMENT    . TOTAL HIP ARTHROPLASTY    . VALVE REPLACEMENT  1/02   Aortic; echo 3/09 valve working well; echo 10/11 working well; put on Coumadin    Allergies  Allergies  Allergen Reactions  . Levofloxacin Nausea Only  . Sulfa Antibiotics Other (See Comments)    Reaction:  Unknown     History of Present Illness    84 year old male with the above complex past medical history including aortic stenosis status post mechanical AVR on chronic Coumadin anticoagulation, permanent atrial fibrillation, chronic diastolic congestive heart failure with an EF of 55 to 60% in 2018, hypertension, hyperlipidemia, chronic dyspnea, GERD, and depression.  He was last seen in cardiology clinic on October 28, at which time he was doing well.  Though he has a history of ER visits related to elevated blood pressures, blood pressure was stable at his last visit.  As noted, he has chronic dyspnea on exertion, and over the past week, he has  noted increasing chest congestion with productive cough of clear sputum.  He was seen in a walk-in clinic on Saturday and chest x-ray suggested a developing infiltrate in the right lower lung.  He was placed on doxycycline, albuterol, and benzonatate.  He thinks that he has had some improvement in dyspnea and cough but has also been noticing early satiety, nausea, increasing  abdominal girth, and weight gain of 3 to 4 pounds.  He followed up with primary care this morning and a chest x-ray was performed which apparently showed worsening airspace disease.  He was sent to the [facility] lab for a BNP and this returned elevated at 467.  In that setting, he was referred to our clinic for add-on.  He is symptom-free at rest and denies any history of chest pain.  He has not been having orthopnea, PND, dizziness, syncope, or early satiety.  He has chronic bilateral lower extremity edema, which is not particularly changed.  Home Medications    Prior to Admission medications   Medication Sig Start Date End Date Taking? Authorizing Provider  Cetirizine HCl 10 MG CAPS Take 10 mg by mouth daily.   Yes [provider]  citalopram (CELEXA) 20 MG tablet Take 20 mg by mouth daily.   Yes [provider]  cloNIDine (CATAPRES) 0.1 MG tablet Take 1 tablet (0.1 mg total) by mouth 2 (two) times daily. 03/02/18  Yes Gollan, Tollie Pizza, MD  doxazosin (CARDURA) 8 MG tablet TAKE 1 TABLET BY MOUTH  TWICE A DAY 03/02/18  Yes Gollan, Tollie Pizza, MD  finasteride (PROSCAR) 5 MG tablet Take 5 mg by mouth daily.     Yes [provider]  furosemide (LASIX) 40 MG tablet Take 1 tablet (40 mg total) by mouth daily. 09/21/18  Yes Antonieta Iba, MD  isosorbide mononitrate (IMDUR) 60 MG 24 hr tablet Take 1 tablet (60 mg total) by mouth 2 (two) times daily. 09/16/18  Yes Antonieta Iba, MD  metoprolol succinate (TOPROL-XL) 25 MG 24 hr tablet Take 1 tablet (25 mg total) by mouth daily. 09/21/18  Yes Gollan, Tollie Pizza, MD  potassium chloride (K-DUR) 10 MEQ tablet Take 1 tablet (10 mEq total) by mouth daily. 09/10/17  Yes Gollan, Tollie Pizza, MD  ranitidine (ZANTAC) 150 MG tablet Take 150 mg by mouth as needed.   Yes [provider]  rOPINIRole (REQUIP) 2 MG tablet TAKE 1 TABLET BY MOUTH 4  TIMES DAILY 09/14/18  Yes Butch Penny, NP  Tamsulosin HCl (FLOMAX) 0.4 MG CAPS Take 0.4 mg  by mouth at bedtime.    Yes [provider]  traZODone (DESYREL) 50 MG tablet Take 50 mg by mouth at bedtime.   Yes [provider]  warfarin (COUMADIN) 5 MG tablet Take 1 tablet daily except 1 and 1/2 tabs on Monday, Wednesday, and Friday or as directed by [facility] Clinic. 10/06/18  Yes Gollan, Tollie Pizza, MD  enoxaparin (LOVENOX) 100 MG/ML injection Inject 1 mL (100 mg total) into the skin every 12 (twelve) hours for 14 days. 09/21/18 10/05/18  Antonieta Iba, MD    Review of Systems    Acute on chronic dyspnea on exertion, increasing abdominal girth, nausea, early satiety, chronic lower extremity edema as outlined above.  He has also had a cough with clear sputum.  All other systems reviewed and are otherwise negative except as noted above.  Physical Exam    VS:  BP (!) 150/88 (BP Location: Left Arm, Patient Position: Sitting, Cuff Size: Normal)  Pulse 78   Resp 16   Ht 5\' 11"  (1.803 m)   Wt 214 lb (97.1 kg)   BMI 29.85 kg/m  , BMI Body mass index is 29.85 kg/m. GEN: Well nourished, well developed, in no acute distress. HEENT: normal. Neck: Supple, JVP approximately 12 to 14 cm.  No carotid bruits, or masses. Cardiac: Irregularly irregular with a 2/6 systolic murmur loudest at the left lower sternal border to apex.  No rubs, or gallops. No clubbing, cyanosis, 1+ bilateral lower extremity edema with varicosities and chronic venous stasis skin changes.  Radials/DP/PT 2+ and equal bilaterally.  Respiratory:  Respirations regular and unlabored, bibasilar crackles. GI: Protuberant, nontender, BS + x 4. MS: no deformity or atrophy. Skin: warm and dry, no rash.  Chronic venous stasis changes of lower extremity's. Neuro:  Strength and sensation are intact. Psych: Normal affect.  Accessory Clinical Findings    ECG personally reviewed by me today -EKG from primary care office dated this morning at 10:16 AM shows atrial fibrillation, 71 - no acute changes.  Assessment  & Plan    1.  Acute on chronic diastolic congestive heart failure: Over the past week, patient has been having increasing dyspnea, cough, increasing abdominal girth, early satiety, and nausea.  His weight is up 3 to 4 pounds.  He is volume overloaded on exam with JVD and his BNP was elevated at 467 this morning.  He is on Lasix 40 mg daily.  He has not taken Lasix in the past 2 days because he had appointments in the mornings.  I have asked him to increase Lasix to 40 mill grams twice daily over the next 3 days.  He also take potassium chloride 10 mEq twice daily over the same..  I will follow-up a basic metabolic panel today.  I will also arrange for repeat echocardiogram as its been over a year since his last one.  We discussed the importance of daily weights, sodium restriction, medication compliance, and symptom reporting and he verbalizes understanding.  I will plan to see him back in 1 week.  2.  Permanent atrial fibrillation: Rate controlled on beta-blocker therapy.  He is adequately with Coumadin in the setting of an AVR.  3.  History of mechanical aortic valve: Stable function on last echo in March 2018.  Following up echo in the setting of above.  4.  Essential hypertension: Blood pressure moderately elevated today at 150/88.  This is in the setting of volume overload and he says he has not taken his Lasix in the past 2 days.  He will continue to follow blood pressure at home and in the setting of increased diuresis, I will not adjust any of his other medications.  5.  Disposition: Follow-up in clinic in 1 week.  Follow-up echo and basic metabolic panel.   Nicolasa Duckinghristopher Berge, NP 10/27/2018, 12:58 PM

## 2018-10-28 LAB — BASIC METABOLIC PANEL
BUN/Creatinine Ratio: 15 (ref 10–24)
BUN: 14 mg/dL (ref 8–27)
CALCIUM: 9.3 mg/dL (ref 8.6–10.2)
CO2: 22 mmol/L (ref 20–29)
Chloride: 95 mmol/L — ABNORMAL LOW (ref 96–106)
Creatinine, Ser: 0.95 mg/dL (ref 0.76–1.27)
GFR, EST AFRICAN AMERICAN: 84 mL/min/{1.73_m2} (ref >59)
GFR, EST NON AFRICAN AMERICAN: 73 mL/min/{1.73_m2} (ref >59)
Glucose: 112 mg/dL — ABNORMAL HIGH (ref 65–99)
Potassium: 4.5 mmol/L (ref 3.5–5.2)
Sodium: 131 mmol/L — ABNORMAL LOW (ref 134–144)

## 2018-10-30 ENCOUNTER — Telehealth: Payer: Self-pay | Admitting: Nurse Practitioner

## 2018-10-30 NOTE — Telephone Encounter (Signed)
  Patient needs to know if it is ok to take Zyrtec with his other medications

## 2018-11-02 ENCOUNTER — Other Ambulatory Visit: Payer: Self-pay | Admitting: Cardiovascular Disease

## 2018-11-02 NOTE — Telephone Encounter (Signed)
Call from patient about cold like sx and drug interactions. Pt reports that he is taking Coricidin for sx control but was wondering if he could take zyrtec as well.   Discussed with pt that taking zyrtec w Coricidin could lead to dizziness or difficulty sleeping. I instructed pt if he chooses to take the zyrtec that is should be taken at night since it is only once per day.   Pt agreeable with plan of care. Advised pt to call for any further questions or concerns

## 2018-11-03 ENCOUNTER — Ambulatory Visit (INDEPENDENT_AMBULATORY_CARE_PROVIDER_SITE_OTHER): Payer: Medicare Other

## 2018-11-03 ENCOUNTER — Other Ambulatory Visit: Payer: Self-pay

## 2018-11-03 DIAGNOSIS — I350 Nonrheumatic aortic (valve) stenosis: Secondary | ICD-10-CM

## 2018-11-03 DIAGNOSIS — I4892 Unspecified atrial flutter: Secondary | ICD-10-CM

## 2018-11-05 ENCOUNTER — Encounter: Payer: Self-pay | Admitting: Student in an Organized Health Care Education/Training Program

## 2018-11-05 ENCOUNTER — Ambulatory Visit
Payer: Medicare Other | Attending: Student in an Organized Health Care Education/Training Program | Admitting: Student in an Organized Health Care Education/Training Program

## 2018-11-05 ENCOUNTER — Other Ambulatory Visit: Payer: Self-pay

## 2018-11-05 VITALS — BP 140/76 | HR 69 | Temp 97.7°F | Resp 18 | Ht 71.0 in | Wt 205.0 lb

## 2018-11-05 DIAGNOSIS — I4892 Unspecified atrial flutter: Secondary | ICD-10-CM | POA: Insufficient documentation

## 2018-11-05 DIAGNOSIS — I5032 Chronic diastolic (congestive) heart failure: Secondary | ICD-10-CM | POA: Diagnosis not present

## 2018-11-05 DIAGNOSIS — Z823 Family history of stroke: Secondary | ICD-10-CM | POA: Insufficient documentation

## 2018-11-05 DIAGNOSIS — I350 Nonrheumatic aortic (valve) stenosis: Secondary | ICD-10-CM | POA: Diagnosis not present

## 2018-11-05 DIAGNOSIS — M1712 Unilateral primary osteoarthritis, left knee: Secondary | ICD-10-CM | POA: Diagnosis not present

## 2018-11-05 DIAGNOSIS — G2581 Restless legs syndrome: Secondary | ICD-10-CM | POA: Insufficient documentation

## 2018-11-05 DIAGNOSIS — I2510 Atherosclerotic heart disease of native coronary artery without angina pectoris: Secondary | ICD-10-CM | POA: Insufficient documentation

## 2018-11-05 DIAGNOSIS — Z79899 Other long term (current) drug therapy: Secondary | ICD-10-CM | POA: Insufficient documentation

## 2018-11-05 DIAGNOSIS — Z8249 Family history of ischemic heart disease and other diseases of the circulatory system: Secondary | ICD-10-CM | POA: Diagnosis not present

## 2018-11-05 DIAGNOSIS — I110 Hypertensive heart disease with heart failure: Secondary | ICD-10-CM | POA: Insufficient documentation

## 2018-11-05 DIAGNOSIS — Z7901 Long term (current) use of anticoagulants: Secondary | ICD-10-CM | POA: Diagnosis not present

## 2018-11-05 DIAGNOSIS — G4733 Obstructive sleep apnea (adult) (pediatric): Secondary | ICD-10-CM | POA: Diagnosis not present

## 2018-11-05 DIAGNOSIS — F329 Major depressive disorder, single episode, unspecified: Secondary | ICD-10-CM | POA: Insufficient documentation

## 2018-11-05 DIAGNOSIS — F419 Anxiety disorder, unspecified: Secondary | ICD-10-CM | POA: Diagnosis not present

## 2018-11-05 DIAGNOSIS — G8929 Other chronic pain: Secondary | ICD-10-CM

## 2018-11-05 DIAGNOSIS — Z952 Presence of prosthetic heart valve: Secondary | ICD-10-CM | POA: Insufficient documentation

## 2018-11-05 DIAGNOSIS — Z96649 Presence of unspecified artificial hip joint: Secondary | ICD-10-CM | POA: Insufficient documentation

## 2018-11-05 DIAGNOSIS — G894 Chronic pain syndrome: Secondary | ICD-10-CM | POA: Insufficient documentation

## 2018-11-05 DIAGNOSIS — M25562 Pain in left knee: Secondary | ICD-10-CM

## 2018-11-05 DIAGNOSIS — Z833 Family history of diabetes mellitus: Secondary | ICD-10-CM | POA: Insufficient documentation

## 2018-11-05 DIAGNOSIS — Z9889 Other specified postprocedural states: Secondary | ICD-10-CM | POA: Diagnosis not present

## 2018-11-05 DIAGNOSIS — Z87891 Personal history of nicotine dependence: Secondary | ICD-10-CM | POA: Insufficient documentation

## 2018-11-05 NOTE — Progress Notes (Signed)
Patient's Name: [name]  MRN: [id number]  Referring Provider: Baxter Hire, MD  DOB: [birth year]  PCP: Baxter Hire, MD  DOS: 11/05/2018  Note by: Gillis Santa, MD  Service setting: Ambulatory outpatient  Specialty: Interventional Pain Management  Location: [facility] (AMB) Pain Management Facility    Patient type: Established   Primary Reason(s) for Visit: Encounter for post-procedure evaluation of chronic illness with mild to moderate exacerbation CC: Knee Pain (left)  HPI  [name] is a 84 y.o. year old, male patient, who comes today for a post-procedure evaluation. He has GERD (gastroesophageal reflux disease); Bradycardia; Depression; Anxiety; Hypertension; Decreased hearing; Coronary artery disease; Aortic stenosis; Warfarin anticoagulation; Carotid bruit; Anemia; Blood loss anemia; S/P AVR (aortic valve replacement); C. difficile colitis; RLS (restless legs syndrome); Bilateral leg edema; Encounter for anticoagulation discussion and counseling; Chronic hyponatremia; Chronic diastolic CHF (congestive heart failure) ([place]); Lymphedema; Obstructive sleep apnea; and H/O atrial flutter on their problem list. His primarily concern today is the Knee Pain (left)  Pain Assessment: Location: Left Knee Radiating: denies Onset: More than a month ago Duration: Chronic pain Quality: Sharp(sometimes sharp, not always, "only if I move the wrong way") Severity: 2 /10 (subjective, self-reported pain score)  Note: Reported level is compatible with observation.                         When using our objective Pain Scale, levels between 6 and 10/10 are said to belong in an emergency room, as it progressively worsens from a 6/10, described as severely limiting, requiring emergency care not usually available at an outpatient pain management facility. At a 6/10 level, communication becomes difficult and requires great effort. Assistance to reach the emergency department may be required. Facial  flushing and profuse sweating along with potentially dangerous increases in heart rate and blood pressure will be evident. Effect on ADL: limits daily activities,  Timing: Intermittent Modifying factors: procedure, hemp freeze topical BP: 140/76  HR: 69  [name] comes in today (84) for post-procedure evaluation.  Further details on both, my assessment(s), as well as the proposed treatment plan, please see below.  Post-Procedure Assessment  09/30/2018 Procedure: Left knee genicular nerve block #2 Pre-procedure pain score:  3/10 Post-procedure pain score: 0/10         Influential Factors: BMI: 28.59 kg/m Intra-procedural challenges: None observed.         Assessment challenges: None detected.              Reported side-effects: None.        Post-procedural adverse reactions or complications: None reported         Sedation: Please see nurses note. When no sedatives are used, the analgesic levels obtained are directly associated to the effectiveness of the local anesthetics. However, when sedation is provided, the level of analgesia obtained during the initial 1 hour following the intervention, is believed to be the result of a combination of factors. These factors may include, but are not limited to: 1. The effectiveness of the local anesthetics used. 2. The effects of the analgesic(s) and/or anxiolytic(s) used. 3. The degree of discomfort experienced by the patient at the time of the procedure. 4. The patients ability and reliability in recalling and recording the events. 5. The presence and influence of possible secondary gains and/or psychosocial factors. Reported result: Relief experienced during the 1st hour after the procedure: 100 % (Ultra-Short Term Relief)  Interpretative annotation: Clinically appropriate result. Analgesia during this period is likely to be Local Anesthetic and/or IV Sedative (Analgesic/Anxiolytic) related.          Effects of local anesthetic: The  analgesic effects attained during this period are directly associated to the localized infiltration of local anesthetics and therefore cary significant diagnostic value as to the etiological location, or anatomical origin, of the pain. Expected duration of relief is directly dependent on the pharmacodynamics of the local anesthetic used. Long-acting (4-6 hours) anesthetics used.  Reported result: Relief during the next 4 to 6 hour after the procedure: 100 % (Short-Term Relief)            Interpretative annotation: Clinically appropriate result. Analgesia during this period is likely to be Local Anesthetic-related.          Long-term benefit: Defined as the period of time past the expected duration of local anesthetics (1 hour for short-acting and 4-6 hours for long-acting). With the possible exception of prolonged sympathetic blockade from the local anesthetics, benefits during this period are typically attributed to, or associated with, other factors such as analgesic sensory neuropraxia, antiinflammatory effects, or beneficial biochemical changes provided by agents other than the local anesthetics.  Reported result: Extended relief following procedure: 100 %(100% relief lasted for 2 days..."my knee feels so much better than [place] i came (for procedure)") (Long-Term Relief)            Interpretative annotation: Clinically possible results. Good relief. No permanent benefit expected. Inflammation plays a part in the etiology to the pain.          Current benefits: Defined as reported results that persistent at this point in time.   Analgesia: 75 %            Function: [name] reports improvement in function ROM: [name] reports improvement in ROM Interpretative annotation: Ongoing benefit. Therapeutic benefit observed. Effective therapeutic approach.          Interpretation: Results would suggest a successful diagnostic intervention.                  Plan:  Proceed with Radiofrequency  Ablation for the purpose of attaining long-term benefits.,  As needed RFA                Laboratory Chemistry  Inflammation Markers (CRP: Acute Phase) (ESR: Chronic Phase) Lab Results  Component Value Date   CRP <0.8 02/05/2018   ESRSEDRATE 7 02/05/2018                         Rheumatology Markers No results found for: RF, ANA, LABURIC, URICUR, LYMEIGGIGMAB, LYMEABIGMQN, HLAB27                      Renal Function Markers Lab Results  Component Value Date   BUN 14 10/27/2018   CREATININE 0.95 10/27/2018   BCR 15 10/27/2018   GFRAA 84 10/27/2018   GFRNONAA 73 10/27/2018                             Hepatic Function Markers Lab Results  Component Value Date   AST 23 02/22/2017   ALT 15 (L) 02/22/2017   ALBUMIN 4.3 02/22/2017   ALKPHOS 67 02/22/2017   LIPASE 116 07/28/2014  Electrolytes Lab Results  Component Value Date   NA 131 (L) 10/27/2018   K 4.5 10/27/2018   CL 95 (L) 10/27/2018   CALCIUM 9.3 10/27/2018   MG 2.0 04/17/2015                        Neuropathy Markers Lab Results  Component Value Date   HGBA1C 5.3 10/31/2014                        CNS Tests No results found for: COLORCSF, APPEARCSF, RBCCOUNTCSF, WBCCSF, POLYSCSF, LYMPHSCSF, EOSCSF, PROTEINCSF, GLUCCSF, JCVIRUS, CSFOLI, IGGCSF                      Bone Pathology Markers No results found for: VD25OH, GE952WU1LKG, MW1027OZ3, GU4403KV4, 25OHVITD1, 25OHVITD2, 25OHVITD3, TESTOFREE, TESTOSTERONE                       Coagulation Parameters Lab Results  Component Value Date   INR 3.9 (A) 10/26/2018   LABPROT 22.7 (H) 08/07/2015   APTT 40 (H) 04/02/2015   PLT 192 02/05/2018                        Cardiovascular Markers Lab Results  Component Value Date   BNP 467.0 (H) 10/27/2018   CKTOTAL 71 08/31/2010   CKMB 1.6 08/31/2010   TROPONINI <0.03 08/16/2017   HGB 13.2 02/05/2018   HCT 39.5 (L) 02/05/2018                         CA Markers No results found for: CEA,  CA125, LABCA2                      Note: Lab results reviewed.  Recent Diagnostic Imaging Results  ECHOCARDIOGRAM COMPLETE                         *[address]                        [place], [place] [address]                            [phone]  ------------------------------------------------------------------- Transthoracic Echocardiography  Patient:    Jvon, Meroney MR #:       [id number] Study Date: 11/03/2018 Gender:     M Age:        84 Height:     180.3 cm Weight:     97.1 kg BSA:        2.23 m^2 Pt. Status: Room:   ATTENDING    Default, Provider [phone]  Bland, MD, [place], [place], [place]  SONOGRAPHER  Pilar Jarvis, RVT, RDCS, RDMS  cc:  ------------------------------------------------------------------- LV EF: 55% -   60%  ------------------------------------------------------------------- Indications:      Aortic valve disorder (I35.0).  ------------------------------------------------------------------- History:   PMH:  MAVR, 2002, tricuspid regurg.  Coronary artery disease.  Congestive heart failure.  Aortic valve disease.  Mitral valve disease.  Primary  pulmonary hypertension.  Risk factors: Hypertension.  ------------------------------------------------------------------- Study Conclusions  - Left ventricle: The cavity size was normal. Wall thickness was   increased in a pattern of mild LVH. Systolic function was normal.   The estimated ejection fraction was in the range of 55% to 60%. - Aortic valve: A mechanical prosthesis was present. There was   trivial regurgitation. Mean gradient (S): 9 mm Hg. - Mitral valve: Calcified annulus. Mildly thickened leaflets . - Left atrium: The atrium was moderately dilated. - Right ventricle: The cavity size was moderately dilated. Systolic   function was mildly to  moderately reduced. - Right atrium: The atrium was moderately dilated. - Tricuspid valve: There was moderate-severe regurgitation. - Pulmonary arteries: Systolic pressure was mildly increased, in   the range of 35 mm Hg to 40 mm Hg. - Pericardium, extracardiac: A trivial pericardial effusion was   identified posterior to the heart.  ------------------------------------------------------------------- Study data:  The previous study was not available, so comparison was made to the report of March 2018.  Study status:  Routine. Procedure:  This was a technically difficult study due to patient&'s constant movement, intolerance to transducer pressure and body habitus. The patient also refused an IV and therefore Definity. Transthoracic echocardiography. Image quality was adequate.  Study completion:  There were no complications.          Transthoracic echocardiography.  M-mode, complete 2D, spectral Doppler, and color Doppler.  Birthdate:  Patient birthdate: Apr 01, 1931.  Age:  Patient is 84 yr old.  Sex:  Gender: male.    BMI: 29.9 kg/m^2.  Blood pressure:     146/84  Patient status:  Outpatient.  Study date: Study date: 11/03/2018. Study time: 03:06 PM.  -------------------------------------------------------------------  ------------------------------------------------------------------- Left ventricle:  The cavity size was normal. Wall thickness was increased in a pattern of mild LVH. Systolic function was normal. The estimated ejection fraction was in the range of 55% to 60%. Images were inadequate for LV wall motion assessment.  ------------------------------------------------------------------- Aortic valve:  A mechanical prosthesis was present.  Doppler: Transvalvular velocity was within the normal range. There was no stenosis. There was trivial regurgitation.    VTI ratio of LVOT to aortic valve: 0.43. Valve area (VTI): 1.34 cm^2. Indexed valve area (VTI): 0.6 cm^2/m^2. Mean  velocity ratio of LVOT to aortic valve: 0.4. Valve area (Vmean): 1.25 cm^2. Indexed valve area (Vmean): 0.56 cm^2/m^2.    Mean gradient (S): 9 mm Hg. Peak gradient (S): 18 mm Hg.  ------------------------------------------------------------------- Aorta:  Aortic root: The aortic root was normal in size. Ascending aorta: The ascending aorta was normal in size.  ------------------------------------------------------------------- Mitral valve:   Calcified annulus. Mildly thickened leaflets . Leaflet separation was normal.  Doppler:  Transvalvular velocity was within the normal range. There was no evidence for stenosis. There was trivial regurgitation.    Planimetered valve area: 2.27 cm^2. Indexed valve area by planimetry: 1.02 cm^2/m^2. Valve area by pressure half-time: 3.06 cm^2. Indexed valve area by pressure half-time: 1.37 cm^2/m^2. Valve area by continuity equation (using LVOT flow): 1.98 cm^2. Indexed valve area by continuity equation (using LVOT flow): 0.89 cm^2/m^2.    Mean gradient (D): 3 mm Hg. Peak gradient (D): 7 mm Hg.  ------------------------------------------------------------------- Left atrium:  The atrium was moderately dilated.  ------------------------------------------------------------------- Right ventricle:  The cavity size was moderately dilated. Systolic function was mildly to moderately reduced.  ------------------------------------------------------------------- Pulmonic valve:    Structurally normal valve.   Cusp separation was normal.  Doppler:  Transvalvular velocity was within the  normal range. There was mild regurgitation.  ------------------------------------------------------------------- Tricuspid valve:   Structurally normal valve.   Leaflet separation was normal.  Doppler:  Transvalvular velocity was within the normal range. There was moderate-severe  regurgitation.  ------------------------------------------------------------------- Pulmonary artery:   Poorly visualized. Systolic pressure was mildly increased, in the range of 35 mm Hg to 40 mm Hg.  ------------------------------------------------------------------- Right atrium:  The atrium was moderately dilated.  ------------------------------------------------------------------- Pericardium:  A trivial pericardial effusion was identified posterior to the heart.  ------------------------------------------------------------------- Systemic veins: Inferior vena cava: The vessel was dilated. The respirophasic diameter changes were blunted (< 50%), consistent with elevated central venous pressure.  ------------------------------------------------------------------- Measurements   Left ventricle                           Value          Reference  LV ID, ED, PLAX chordal                  45    mm       43 - 52  LV ID, ES, PLAX chordal                  30    mm       23 - 38  LV fx shortening, PLAX chordal           33    %        >=29  LV PW thickness, ED                      10    mm       ----------  IVS/LV PW ratio, ED                      1.1            <=1.3  Stroke volume, 2D                        62    ml       ----------  Stroke volume/bsa, 2D                    28    ml/m^2   ----------  LV ejection fraction, 1-p A4C            51    %        ----------  LV e&', lateral                           6.85  cm/s     ----------  LV E/e&', lateral                         19.42          ----------  LV e&', medial                            7.29  cm/s     ----------  LV E/e&', medial                          18.24          ----------  LV e&', average  7.07  cm/s     ----------  LV E/e&', average                         18.81          ----------    Ventricular septum                       Value          Reference  IVS thickness, ED                         11    mm       ----------    LVOT                                     Value          Reference  LVOT ID, S                               20    mm       ----------  LVOT area                                3.14  cm^2     ----------  LVOT ID                                  20    mm       ----------  LVOT mean velocity, S                    53.8  cm/s     ----------  LVOT VTI, S                              19.6  cm       ----------  Stroke volume (SV), LVOT DP              61.6  ml       ----------  Stroke index (SV/bsa), LVOT DP           27.6  ml/m^2   ----------    Aortic valve                             Value          Reference  Aortic valve peak velocity, S            210   cm/s     ----------  Aortic valve mean velocity, S            135   cm/s     ----------  Aortic valve VTI, S                      46    cm       ----------  Aortic mean gradient, S                  9     mm  Hg    ----------  Aortic peak gradient, S                  18    mm Hg    ----------  VTI ratio, LVOT/AV                       0.43           ----------  Aortic valve area, VTI                   1.34  cm^2     ----------  Aortic valve area/bsa, VTI               0.6   cm^2/m^2 ----------  Velocity ratio, mean, LVOT/AV            0.4            ----------  Aortic valve area, mean velocity         1.25  cm^2     ----------  Aortic valve area/bsa, mean              0.56  cm^2/m^2 ----------  velocity    Aorta                                    Value          Reference  Aortic root ID, ED                       28    mm       ----------  Ascending aorta ID, A-P, S               30    mm       ----------    Left atrium                              Value          Reference  LA ID, A-P, ES                           50    mm       ----------  LA ID/bsa, A-P                   (H)     2.24  cm/m^2   <=2.2  LA volume, S                             130   ml       ----------  LA volume/bsa, S                          58.3  ml/m^2   ----------  LA volume, ES, 1-p A4C                   125   ml       ----------  LA volume/bsa, ES, 1-p A4C               56.1  ml/m^2   ----------  LA volume, ES, 1-p A2C  123   ml       ----------  LA volume/bsa, ES, 1-p A2C               55.2  ml/m^2   ----------    Mitral valve                             Value          Reference  Mitral valve area, planimetry            2.27  cm^2     ----------  Mitral valve area/bsa,                   1.02  cm^2/m^2 ----------  planimetry  Mitral E-wave peak velocity              133   cm/s     ----------  Mitral mean velocity, D                  67.4  cm/s     ----------  Mitral deceleration time         (H)     246   ms       150 - 230  Mitral pressure half-time                72    ms       ----------  Mitral mean gradient, D                  3     mm Hg    ----------  Mitral peak gradient, D                  7     mm Hg    ----------  Mitral valve area, PHT, DP               3.06  cm^2     ----------  Mitral valve area/bsa, PHT, DP           1.37  cm^2/m^2 ----------  Mitral valve area, LVOT                  1.98  cm^2     ----------  continuity  Mitral valve area/bsa, LVOT              0.89  cm^2/m^2 ----------  continuity  Mitral annulus VTI, D                    31.1  cm       ----------    Tricuspid valve                          Value          Reference  Tricuspid regurg peak velocity           274   cm/s     ----------    Right atrium                             Value          Reference  RA ID, S-I, ES, A4C              (H)     71.1  mm       34 - 49  RA area, ES, A4C                 (H)     27.9  cm^2     8.3 - 19.5  RA volume, ES, A/L                       89.7  ml       ----------  RA volume/bsa, ES, A/L                   40.3  ml/m^2   ----------    Right ventricle                          Value          Reference  TAPSE                                    19.1  mm       ----------  RV s&',  lateral, S                        11.6  cm/s     ----------    Pulmonic valve                           Value          Reference  Pulmonic valve peak velocity, S          76.6  cm/s     ----------  Legend: (L)  and  (H)  mark values outside specified reference range.  ------------------------------------------------------------------- Prepared and Electronically Authenticated by  Nelva Bush, MD 2019-12-11T20:47:13  Complexity Note: Imaging results reviewed. Results shared with Mr. Eskelson, using Layman's terms.                         Meds   Current Outpatient Medications:  .  Cetirizine HCl 10 MG CAPS, Take 10 mg by mouth daily., Disp: , Rfl:  .  citalopram (CELEXA) 20 MG tablet, Take 20 mg by mouth daily., Disp: , Rfl:  .  cloNIDine (CATAPRES) 0.1 MG tablet, Take 1 tablet (0.1 mg total) by mouth 2 (two) times daily., Disp: 180 tablet, Rfl: 3 .  cloNIDine (CATAPRES) 0.1 MG tablet, TAKE 1 TABLET BY MOUTH TWO  TIMES DAILY, Disp: 180 tablet, Rfl: 0 .  doxazosin (CARDURA) 8 MG tablet, TAKE 1 TABLET BY MOUTH  TWICE A DAY, Disp: 180 tablet, Rfl: 1 .  finasteride (PROSCAR) 5 MG tablet, Take 5 mg by mouth daily.  , Disp: , Rfl:  .  furosemide (LASIX) 40 MG tablet, Take 1 tablet (40 mg total) by mouth daily., Disp: 90 tablet, Rfl: 3 .  isosorbide mononitrate (IMDUR) 60 MG 24 hr tablet, Take 1 tablet (60 mg total) by mouth 2 (two) times daily., Disp: 20 tablet, Rfl: 0 .  metoprolol succinate (TOPROL-XL) 25 MG 24 hr tablet, Take 1 tablet (25 mg total) by mouth daily., Disp: 90 tablet, Rfl: 3 .  potassium chloride (K-DUR) 10 MEQ tablet, Take 1 tablet (10 mEq total) by mouth daily., Disp: 90 tablet, Rfl: 3 .  ranitidine (ZANTAC) 150 MG tablet, Take 150 mg by mouth as needed., Disp: , Rfl:  .  rOPINIRole (REQUIP) 2 MG tablet,  TAKE 1 TABLET BY MOUTH 4  TIMES DAILY, Disp: 360 tablet, Rfl: 3 .  Tamsulosin HCl (FLOMAX) 0.4 MG CAPS, Take 0.4 mg by mouth at bedtime. , Disp: , Rfl:  .  traZODone  (DESYREL) 50 MG tablet, Take 50 mg by mouth at bedtime., Disp: , Rfl:  .  warfarin (COUMADIN) 5 MG tablet, Take 1 tablet daily except 1 and 1/2 tabs on Monday, Wednesday, and Friday or as directed by [facility]., Disp: 120 tablet, Rfl: 0 .  enoxaparin (LOVENOX) 100 MG/ML injection, Inject 1 mL (100 mg total) into the skin every 12 (twelve) hours for 14 days., Disp: 14 Syringe, Rfl: 1  ROS  Constitutional: Denies any fever or chills Gastrointestinal: No reported hemesis, hematochezia, vomiting, or acute GI distress Musculoskeletal: Denies any acute onset joint swelling, redness, loss of ROM, or weakness Neurological: No reported episodes of acute onset apraxia, aphasia, dysarthria, agnosia, amnesia, paralysis, loss of coordination, or loss of consciousness  Allergies  Mr. Portnoy is allergic to levofloxacin and sulfa antibiotics.  [place]  Drug: Mr. Pedrosa  reports no history of drug use. Alcohol:  reports current alcohol use of about 3.0 standard drinks of alcohol per week. Tobacco:  reports that he quit smoking about 40 years ago. His smoking use included cigarettes. He has never used smokeless tobacco. Medical:  has a past medical history of Anxiety (10/11), Aortic stenosis, Bradycardia, C. difficile colitis, Carotid bruit, Chronic diastolic CHF (congestive heart failure) ([place]), Coronary artery disease, Decreased hearing, Depression, Gastric ulcer, GERD (gastroesophageal reflux disease), Hypertension, RLS (restless legs syndrome) (08/23/2015), S/P AVR, and SOB (shortness of breath) (10/11). Surgical: Mr. Deren  has a past surgical history that includes Valve replacement (1/02); Cardiac catheterization; Joint replacement; Hernia repair; Esophagogastroduodenoscopy (N/A, 04/05/2015); Esophagogastroduodenoscopy (N/A, 04/17/2015); Esophagogastroduodenoscopy (N/A, 08/02/2015); and Total hip arthroplasty. Family: family history includes Diabetes type II in his mother; Heart attack in his mother; Heart  disease in his father and mother; Hypertension in his father, mother, and another family member; Stroke in his brother, brother, and father.  Constitutional Exam  General appearance: Well nourished, well developed, and well hydrated. In no apparent acute distress Vitals:   11/05/18 1001  BP: 140/76  Pulse: 69  Resp: 18  Temp: 97.7 F (36.5 C)  TempSrc: Oral  SpO2: 100%  Weight: 205 lb (93 kg)  Height: 5' 11"  (1.803 m)   BMI Assessment: Estimated body mass index is 28.59 kg/m as calculated from the following:   Height as of this encounter: 5' 11"  (1.803 m).   Weight as of this encounter: 205 lb (93 kg).  BMI interpretation table: BMI level Category Range association with higher incidence of chronic pain  <18 kg/m2 Underweight   18.5-24.9 kg/m2 Ideal body weight   25-29.9 kg/m2 Overweight Increased incidence by 20%  30-34.9 kg/m2 Obese (Class I) Increased incidence by 68%  35-39.9 kg/m2 Severe obesity (Class II) Increased incidence by 136%  >40 kg/m2 Extreme obesity (Class III) Increased incidence by 254%   Patient's current BMI Ideal Body weight  Body mass index is 28.59 kg/m. Ideal body weight: 75.3 kg (166 lb 0.1 oz) Adjusted ideal body weight: 82.4 kg (181 lb 9.7 oz)   BMI Readings from Last 4 Encounters:  11/05/18 28.59 kg/m  10/27/18 29.85 kg/m  09/30/18 28.59 kg/m  09/21/18 29.50 kg/m   Wt Readings from Last 4 Encounters:  11/05/18 205 lb (93 kg)  10/27/18 214 lb (97.1 kg)  09/30/18 205 lb (93 kg)  09/21/18 211 lb 8  oz (95.9 kg)  Psych/Mental status: Alert, oriented x 3 (person, place, & time)       Eyes: PERLA Respiratory: No evidence of acute respiratory distress  Cervical Spine Area Exam  Skin & Axial Inspection: No masses, redness, edema, swelling, or associated skin lesions Alignment: Symmetrical Functional ROM: Unrestricted ROM      Stability: No instability detected Muscle Tone/Strength: Functionally intact. No obvious neuro-muscular anomalies  detected. Sensory (Neurological): Unimpaired Palpation: No palpable anomalies              Upper Extremity (UE) Exam    Side: Right upper extremity  Side: Left upper extremity  Skin & Extremity Inspection: Skin color, temperature, and hair growth are WNL. No peripheral edema or cyanosis. No masses, redness, swelling, asymmetry, or associated skin lesions. No contractures.  Skin & Extremity Inspection: Skin color, temperature, and hair growth are WNL. No peripheral edema or cyanosis. No masses, redness, swelling, asymmetry, or associated skin lesions. No contractures.  Functional ROM: Unrestricted ROM          Functional ROM: Unrestricted ROM          Muscle Tone/Strength: Functionally intact. No obvious neuro-muscular anomalies detected.  Muscle Tone/Strength: Functionally intact. No obvious neuro-muscular anomalies detected.  Sensory (Neurological): Unimpaired          Sensory (Neurological): Unimpaired          Palpation: No palpable anomalies              Palpation: No palpable anomalies              Provocative Test(s):  Phalen's test: deferred Tinel's test: deferred Apley's scratch test (touch opposite shoulder):  Action 1 (Across chest): deferred Action 2 (Overhead): deferred Action 3 (LB reach): deferred   Provocative Test(s):  Phalen's test: deferred Tinel's test: deferred Apley's scratch test (touch opposite shoulder):  Action 1 (Across chest): deferred Action 2 (Overhead): deferred Action 3 (LB reach): deferred     Gait & Posture Assessment  Ambulation: Patient ambulates using a cane Gait: Limited. Using assistive device to ambulate Posture: Difficulty standing up straight, due to pain   Lower Extremity Exam    Side: Right lower extremity  Side: Left lower extremity  Stability: No instability observed          Stability: No instability observed          Skin & Extremity Inspection: Skin color, temperature, and hair growth are WNL. No peripheral edema or cyanosis. No  masses, redness, swelling, asymmetry, or associated skin lesions. No contractures.  Skin & Extremity Inspection: Skin color, temperature, and hair growth are WNL. No peripheral edema or cyanosis. No masses, redness, swelling, asymmetry, or associated skin lesions. No contractures.  Functional ROM: Unrestricted ROM                  Functional ROM: Improved after treatment for knee joint          Muscle Tone/Strength: Functionally intact. No obvious neuro-muscular anomalies detected.  Muscle Tone/Strength: Functionally intact. No obvious neuro-muscular anomalies detected.  Sensory (Neurological): Unimpaired        Sensory (Neurological): Improved        DTR: Patellar: deferred today Achilles: deferred today Plantar: deferred today  DTR: Patellar: deferred today Achilles: deferred today Plantar: deferred today  Palpation: No palpable anomalies  Palpation: No palpable anomalies   Assessment  Primary Diagnosis & Pertinent Problem List: The primary encounter diagnosis was Primary osteoarthritis of left knee. Diagnoses of Chronic pain  of left knee and Chronic pain syndrome were also pertinent to this visit.  Status Diagnosis  Persistent Improved Improving 1. Primary osteoarthritis of left knee   2. Chronic pain of left knee   3. Chronic pain syndrome      Patient follows up status post left knee genicular nerve block #2 and is endorsing ongoing significant pain relief rated as approximately 75%.  Patient noted 100% pain relief of his left knee for the first week.  He states that he has improvement in his range of motion and his ability to bear weight on his left knee.  We will order as needed genicular nerve RFA.  Patient is on Coumadin and will need Lovenox bridge as he did with his previous genicular nerve blocks.  Risks and benefits of genicular nerve RFA were discussed and patient like to proceed.  Plan: -PRN left knee genicular nerve RFA, blood thinner protocol as patient on Coumadin  given aVR, will need bridge with Lovenox.  Plan of Care   Lab-work, procedure(s), and/or referral(s): Orders Placed This Encounter  Procedures  . Radiofrequency,Genicular     PRN Procedures:   L knee genicular rfa prn   Time Note: Greater than 50% of the 25 minute(s) of face-to-face time spent with Mr. Poke, was spent in counseling/coordination of care regarding: Mr. Weider primary cause of pain, the treatment plan, treatment alternatives, the risks and possible complications of proposed treatment, going over the informed consent and realistic expectations.  Provider-requested follow-up: Return if symptoms worsen or fail to improve, for Procedure, PRN, Blood Thinner Protocol.  Future Appointments  Date Time Provider [place]  11/06/2018 10:00 AM Theora Gianotti, NP [facility]  11/09/2018 11:15 AM [facility]  11/11/2018  1:45 PM Marzetta Board, DPM [facility]  02/03/2019  2:15 PM Venancio Poisson, NP [facility] None    Primary Care Physician: Baxter Hire, MD Location: [facility] Note by: Gillis Santa, M.D Date: 11/05/2018; Time: 10:41 AM  Patient Instructions   Moderate Conscious Sedation, Adult Sedation is the use of medicines to promote relaxation and relieve discomfort and anxiety. Moderate conscious sedation is a type of sedation. Under moderate conscious sedation, you are less alert than normal, but you are still able to respond to instructions, touch, or both. Moderate conscious sedation is used during short medical and dental procedures. It is milder than deep sedation, which is a type of sedation under which you cannot be easily woken up. It is also milder than general anesthesia, which is the use of medicines to make you unconscious. Moderate conscious sedation allows you to return to your regular activities sooner. Tell a health care provider about:  Any  allergies you have.  All medicines you are taking, including vitamins, herbs, eye drops, creams, and over-the-counter medicines.  Use of steroids (by mouth or creams).  Any problems you or family members have had with sedatives and anesthetic medicines.  Any blood disorders you have.  Any surgeries you have had.  Any medical conditions you have, such as sleep apnea.  Whether you are pregnant or may be pregnant.  Any use of cigarettes, alcohol, marijuana, or street drugs. What are the risks? Generally, this is a safe procedure. However, problems may occur, including:  Getting too much medicine (oversedation).  Nausea.  Allergic reaction to medicines.  Trouble breathing. If this happens, a breathing tube may be used to help with breathing. It will be removed when you are awake and  breathing on your own.  Heart trouble.  Lung trouble.  What happens before the procedure? Staying hydrated Follow instructions from your health care provider about hydration, which may include:  Up to 2 hours before the procedure - you may continue to drink clear liquids, such as water, clear fruit juice, black coffee, and plain tea.  Eating and drinking restrictions Follow instructions from your health care provider about eating and drinking, which may include:  8 hours before the procedure - stop eating heavy meals or foods such as meat, fried foods, or fatty foods.  6 hours before the procedure - stop eating light meals or foods, such as toast or cereal.  6 hours before the procedure - stop drinking milk or drinks that contain milk.  2 hours before the procedure - stop drinking clear liquids.  Medicine  Ask your health care provider about:  Changing or stopping your regular medicines. This is especially important if you are taking diabetes medicines or blood thinners.  Taking medicines such as aspirin and ibuprofen. These medicines can thin your blood. Do not take these medicines  before your procedure if your health care provider instructs you not to.  Tests and exams  You will have a physical exam.  You may have blood tests done to show: ? How well your kidneys and liver are working. ? How well your blood can clot. General instructions  Plan to have someone take you home from the hospital or clinic.  If you will be going home right after the procedure, plan to have someone with you for 24 hours. What happens during the procedure?  An IV tube will be inserted into one of your veins.  Medicine to help you relax (sedative) will be given through the IV tube.  The medical or dental procedure will be performed. What happens after the procedure?  Your blood pressure, heart rate, breathing rate, and blood oxygen level will be monitored often until the medicines you were given have worn off.  Do not drive for 24 hours. This information is not intended to replace advice given to you by your health care provider. Make sure you discuss any questions you have with your health care provider. Document Released: 08/06/2001 Document Revised: 04/16/2016 Document Reviewed: 03/02/2016 Elsevier Interactive Patient Education  2018 [place]  What are the risk, side effects and possible complications? Generally speaking, most procedures are safe.  However, with any procedure there are risks, side effects, and the possibility of complications.  The risks and complications are dependent upon the sites that are lesioned, or the type of nerve block to be performed.  The closer the procedure is to the spine, the more serious the risks are.  Great care is taken when placing the radio frequency needles, block needles or lesioning probes, but sometimes complications can occur. 1. Infection: Any time there is an injection through the skin, there is a risk of infection.  This is why sterile conditions are used for these blocks.  There are four possible  types of infection. 1. Localized skin infection. 2. Central Nervous System Infection-This can be in the form of Meningitis, which can be deadly. 3. Epidural Infections-This can be in the form of an epidural abscess, which can cause pressure inside of the spine, causing compression of the spinal cord with subsequent paralysis. This would require an emergency surgery to decompress, and there are no guarantees that the patient would recover from the paralysis. 4. Discitis-This is  an infection of the intervertebral discs.  It occurs in about 1% of discography procedures.  It is difficult to treat and it may lead to surgery.        2. Pain: the needles have to go through skin and soft tissues, will cause soreness.       3. Damage to internal structures:  The nerves to be lesioned may be near blood vessels or    other nerves which can be potentially damaged.       4. Bleeding: Bleeding is more common if the patient is taking blood thinners such as  aspirin, Coumadin, Ticiid, Plavix, etc., or if he/she have some genetic predisposition  such as hemophilia. Bleeding into the spinal canal can cause compression of the spinal  cord with subsequent paralysis.  This would require an emergency surgery to  decompress and there are no guarantees that the patient would recover from the  paralysis.       5. Pneumothorax:  Puncturing of a lung is a possibility, every time a needle is introduced in  the area of the chest or upper back.  Pneumothorax refers to free air around the  collapsed lung(s), inside of the thoracic cavity (chest cavity).  Another two possible  complications related to a similar event would include: Hemothorax and Chylothorax.   These are variations of the Pneumothorax, where instead of air around the collapsed  lung(s), you may have blood or chyle, respectively.       6. Spinal headaches: They may occur with any procedures in the area of the spine.       7. Persistent CSF (Cerebro-Spinal Fluid)  leakage: This is a rare problem, but may occur  with prolonged intrathecal or epidural catheters either due to the formation of a fistulous  track or a dural tear.       8. Nerve damage: By working so close to the spinal cord, there is always a possibility of  nerve damage, which could be as serious as a permanent spinal cord injury with  paralysis.       9. Death:  Although rare, severe deadly allergic reactions known as "Anaphylactic  reaction" can occur to any of the medications used.      10. Worsening of the symptoms:  We can always make thing worse.  What are the chances of something like this happening? Chances of any of this occuring are extremely low.  By statistics, you have more of a chance of getting killed in a motor vehicle accident: while driving to the hospital than any of the above occurring .  Nevertheless, you should be aware that they are possibilities.  In general, it is similar to taking a shower.  Everybody knows that you can slip, hit your head and get killed.  Does that mean that you should not shower again?  Nevertheless always keep in mind that statistics do not mean anything if you happen to be on the wrong side of them.  Even if a procedure has a 1 (one) in a 1,000,000 (million) chance of going wrong, it you happen to be that one..Also, keep in mind that by statistics, you have more of a chance of having something go wrong when taking medications.  Who should not have this procedure? If you are on a blood thinning medication (e.g. Coumadin, Plavix, see list of "Blood Thinners"), or if you have an active infection going on, you should not have the procedure.  If you are taking any  blood thinners, please inform your physician.  How should I prepare for this procedure?  Do not eat or drink anything at least six hours prior to the procedure.  Bring a driver with you .  It cannot be a taxi.  Come accompanied by an adult that can drive you back, and that is strong enough to  help you if your legs get weak or numb from the local anesthetic.  Take all of your medicines the morning of the procedure with just enough water to swallow them.  If you have diabetes, make sure that you are scheduled to have your procedure done first thing in the morning, whenever possible.  If you have diabetes, take only half of your insulin dose and notify our nurse that you have done so as soon as you arrive at the clinic.  If you are diabetic, but only take blood sugar pills (oral hypoglycemic), then do not take them on the morning of your procedure.  You may take them after you have had the procedure.  Do not take aspirin or any aspirin-containing medications, at least eleven (11) days prior to the procedure.  They may prolong bleeding.  Wear loose fitting clothing that may be easy to take off and that you would not mind if it got stained with Betadine or blood.  Do not wear any jewelry or perfume  Remove any nail coloring.  It will interfere with some of our monitoring equipment.  NOTE: Remember that this is not meant to be interpreted as a complete list of all possible complications.  Unforeseen problems may occur.  BLOOD THINNERS The following drugs contain aspirin or other products, which can cause increased bleeding during surgery and should not be taken for 2 weeks prior to and 1 week after surgery.  If you should need take something for relief of minor pain, you may take acetaminophen which is found in Tylenol,m Datril, Anacin-3 and Panadol. It is not blood thinner. The products listed below are.  Do not take any of the products listed below in addition to any listed on your instruction sheet.  A.P.C or A.P.C with Codeine Codeine Phosphate Capsules #3 Ibuprofen Ridaura  ABC compound Congesprin Imuran rimadil  Advil Cope Indocin Robaxisal  Alka-Seltzer Effervescent Pain Reliever and Antacid Coricidin or Coricidin-D  Indomethacin Rufen  Alka-Seltzer plus Cold Medicine Cosprin  Ketoprofen S-A-C Tablets  Anacin Analgesic Tablets or Capsules Coumadin Korlgesic Salflex  Anacin Extra Strength Analgesic tablets or capsules CP-2 Tablets Lanoril Salicylate  Anaprox Cuprimine Capsules Levenox Salocol  Anexsia-D Dalteparin Magan Salsalate  Anodynos Darvon compound Magnesium Salicylate Sine-off  Ansaid Dasin Capsules Magsal Sodium Salicylate  Anturane Depen Capsules Marnal Soma  APF Arthritis pain formula Dewitt's Pills Measurin Stanback  Argesic Dia-Gesic Meclofenamic Sulfinpyrazone  Arthritis Bayer Timed Release Aspirin Diclofenac Meclomen Sulindac  Arthritis pain formula Anacin Dicumarol Medipren Supac  Analgesic (Safety coated) Arthralgen Diffunasal Mefanamic Suprofen  Arthritis Strength Bufferin Dihydrocodeine Mepro Compound Suprol  Arthropan liquid Dopirydamole Methcarbomol with Aspirin Synalgos  ASA tablets/Enseals Disalcid Micrainin Tagament  Ascriptin Doan's Midol Talwin  Ascriptin A/D Dolene Mobidin Tanderil  Ascriptin Extra Strength Dolobid Moblgesic Ticlid  Ascriptin with Codeine Doloprin or Doloprin with Codeine Momentum Tolectin  Asperbuf Duoprin Mono-gesic Trendar  Aspergum Duradyne Motrin or Motrin IB Triminicin  Aspirin plain, buffered or enteric coated Durasal Myochrisine Trigesic  Aspirin Suppositories Easprin Nalfon Trillsate  Aspirin with Codeine Ecotrin Regular or Extra Strength Naprosyn Uracel  Atromid-S Efficin Naproxen Ursinus  Auranofin Capsules Elmiron Neocylate Vanquish  Axotal Emagrin Norgesic Verin  Azathioprine Empirin or Empirin with Codeine Normiflo Vitamin E  Azolid Emprazil Nuprin Voltaren  Bayer Aspirin plain, buffered or children's or timed BC Tablets or powders Encaprin Orgaran Warfarin Sodium  Buff-a-Comp Enoxaparin Orudis Zorpin  Buff-a-Comp with Codeine Equegesic Os-Cal-Gesic   Buffaprin Excedrin plain, buffered or Extra Strength Oxalid   Bufferin Arthritis Strength Feldene Oxphenbutazone   Bufferin plain or Extra Strength  Feldene Capsules Oxycodone with Aspirin   Bufferin with Codeine Fenoprofen Fenoprofen Pabalate or Pabalate-SF   Buffets II Flogesic Panagesic   Buffinol plain or Extra Strength Florinal or Florinal with Codeine Panwarfarin   Buf-Tabs Flurbiprofen Penicillamine   Butalbital Compound Four-way cold tablets Penicillin   Butazolidin Fragmin Pepto-Bismol   Carbenicillin Geminisyn Percodan   Carna Arthritis Reliever Geopen Persantine   Carprofen Gold's salt Persistin   Chloramphenicol Goody's Phenylbutazone   Chloromycetin Haltrain Piroxlcam   Clmetidine heparin Plaquenil   Cllnoril Hyco-pap Ponstel   Clofibrate Hydroxy chloroquine Propoxyphen         Before stopping any of these medications, be sure to consult the physician who ordered them.  Some, such as Coumadin (Warfarin) are ordered to prevent or treat serious conditions such as "deep thrombosis", "pumonary embolisms", and other heart problems.  The amount of time that you may need off of the medication may also vary with the medication and the reason for which you were taking it.  If you are taking any of these medications, please make sure you notify your pain physician before you undergo any procedures.   Radiofrequency Lesioning Radiofrequency lesioning is a procedure that is performed to relieve pain. The procedure is often used for back, neck, or arm pain. Radiofrequency lesioning involves the use of a machine that creates radio waves to make heat. During the procedure, the heat is applied to the nerve that carries the pain signal. The heat damages the nerve and interferes with the pain signal. Pain relief usually starts about 2 weeks after the procedure and lasts for 6 months to 1 year. Tell a health care provider about:  Any allergies you have.  All medicines you are taking, including vitamins, herbs, eye drops, creams, and over-the-counter medicines.  Any problems you or family members have had with anesthetic medicines.  Any  blood disorders you have.  Any surgeries you have had.  Any medical conditions you have.  Whether you are pregnant or may be pregnant. What are the risks? Generally, this is a safe procedure. However, problems may occur, including:  Pain or soreness at the injection site.  Infection at the injection site.  Damage to nerves or blood vessels.  What happens before the procedure?  Ask your health care provider about: ? Changing or stopping your regular medicines. This is especially important if you are taking diabetes medicines or blood thinners. ? Taking medicines such as aspirin and ibuprofen. These medicines can thin your blood. Do not take these medicines before your procedure if your health care provider instructs you not to.  Follow instructions from your health care provider about eating or drinking restrictions.  Plan to have someone take you home after the procedure.  If you go home right after the procedure, plan to have someone with you for 24 hours. What happens during the procedure?  You will be given one or more of the following: ? A medicine to help you relax (sedative). ? A medicine to numb the area (local anesthetic).  You will be awake during the procedure. You  will need to be able to talk with the health care provider during the procedure.  With the help of a type of X-ray (fluoroscopy), the health care provider will insert a radiofrequency needle into the area to be treated.  Next, a wire that carries the radio waves (electrode) will be put through the radiofrequency needle. An electrical pulse will be sent through the electrode to verify the correct nerve. You will feel a tingling sensation, and you may have muscle twitching.  Then, the tissue that is around the needle tip will be heated by an electric current that is passed using the radiofrequency machine. This will numb the nerves.  A bandage (dressing) will be put on the insertion area after the procedure  is done. The procedure may vary among health care providers and hospitals. What happens after the procedure?  Your blood pressure, heart rate, breathing rate, and blood oxygen level will be monitored often until the medicines you were given have worn off.  Return to your normal activities as directed by your health care provider. This information is not intended to replace advice given to you by your health care provider. Make sure you discuss any questions you have with your health care provider. Document Released: 07/10/2011 Document Revised: 04/18/2016 Document Reviewed: 12/19/2014 Elsevier Interactive Patient Education  [employer].

## 2018-11-05 NOTE — Patient Instructions (Signed)
Moderate Conscious Sedation, Adult Sedation is the use of medicines to promote relaxation and relieve discomfort and anxiety. Moderate conscious sedation is a type of sedation. Under moderate conscious sedation, you are less alert than normal, but you are still able to respond to instructions, touch, or both. Moderate conscious sedation is used during short medical and dental procedures. It is milder than deep sedation, which is a type of sedation under which you cannot be easily woken up. It is also milder than general anesthesia, which is the use of medicines to make you unconscious. Moderate conscious sedation allows you to return to your regular activities sooner. Tell a health care provider about:  Any allergies you have.  All medicines you are taking, including vitamins, herbs, eye drops, creams, and over-the-counter medicines.  Use of steroids (by mouth or creams).  Any problems you or family members have had with sedatives and anesthetic medicines.  Any blood disorders you have.  Any surgeries you have had.  Any medical conditions you have, such as sleep apnea.  Whether you are pregnant or may be pregnant.  Any use of cigarettes, alcohol, marijuana, or street drugs. What are the risks? Generally, this is a safe procedure. However, problems may occur, including:  Getting too much medicine (oversedation).  Nausea.  Allergic reaction to medicines.  Trouble breathing. If this happens, a breathing tube may be used to help with breathing. It will be removed when you are awake and breathing on your own.  Heart trouble.  Lung trouble.  What happens before the procedure? Staying hydrated Follow instructions from your health care provider about hydration, which may include:  Up to 2 hours before the procedure - you may continue to drink clear liquids, such as water, clear fruit juice, black coffee, and plain tea.  Eating and drinking restrictions Follow instructions from  your health care provider about eating and drinking, which may include:  8 hours before the procedure - stop eating heavy meals or foods such as meat, fried foods, or fatty foods.  6 hours before the procedure - stop eating light meals or foods, such as toast or cereal.  6 hours before the procedure - stop drinking milk or drinks that contain milk.  2 hours before the procedure - stop drinking clear liquids.  Medicine  Ask your health care provider about:  Changing or stopping your regular medicines. This is especially important if you are taking diabetes medicines or blood thinners.  Taking medicines such as aspirin and ibuprofen. These medicines can thin your blood. Do not take these medicines before your procedure if your health care provider instructs you not to.  Tests and exams  You will have a physical exam.  You may have blood tests done to show: ? How well your kidneys and liver are working. ? How well your blood can clot. General instructions  Plan to have someone take you home from the hospital or clinic.  If you will be going home right after the procedure, plan to have someone with you for 24 hours. What happens during the procedure?  An IV tube will be inserted into one of your veins.  Medicine to help you relax (sedative) will be given through the IV tube.  The medical or dental procedure will be performed. What happens after the procedure?  Your blood pressure, heart rate, breathing rate, and blood oxygen level will be monitored often until the medicines you were given have worn off.  Do not drive for 24 hours.   This information is not intended to replace advice given to you by your health care provider. Make sure you discuss any questions you have with your health care provider. Document Released: 08/06/2001 Document Revised: 04/16/2016 Document Reviewed: 03/02/2016 Elsevier Interactive Patient Education  2018 [employer]   GENERAL RISKS AND  COMPLICATIONS  What are the risk, side effects and possible complications? Generally speaking, most procedures are safe.  However, with any procedure there are risks, side effects, and the possibility of complications.  The risks and complications are dependent upon the sites that are lesioned, or the type of nerve block to be performed.  The closer the procedure is to the spine, the more serious the risks are.  Great care is taken when placing the radio frequency needles, block needles or lesioning probes, but sometimes complications can occur. 1. Infection: Any time there is an injection through the skin, there is a risk of infection.  This is why sterile conditions are used for these blocks.  There are four possible types of infection. 1. Localized skin infection. 2. Central Nervous System Infection-This can be in the form of Meningitis, which can be deadly. 3. Epidural Infections-This can be in the form of an epidural abscess, which can cause pressure inside of the spine, causing compression of the spinal cord with subsequent paralysis. This would require an emergency surgery to decompress, and there are no guarantees that the patient would recover from the paralysis. 4. Discitis-This is an infection of the intervertebral discs.  It occurs in about 1% of discography procedures.  It is difficult to treat and it may lead to surgery.        2. Pain: the needles have to go through skin and soft tissues, will cause soreness.       3. Damage to internal structures:  The nerves to be lesioned may be near blood vessels or    other nerves which can be potentially damaged.       4. Bleeding: Bleeding is more common if the patient is taking blood thinners such as  aspirin, Coumadin, Ticiid, Plavix, etc., or if he/she have some genetic predisposition  such as hemophilia. Bleeding into the spinal canal can cause compression of the spinal  cord with subsequent paralysis.  This would require an emergency surgery  to  decompress and there are no guarantees that the patient would recover from the  paralysis.       5. Pneumothorax:  Puncturing of a lung is a possibility, every time a needle is introduced in  the area of the chest or upper back.  Pneumothorax refers to free air around the  collapsed lung(s), inside of the thoracic cavity (chest cavity).  Another two possible  complications related to a similar event would include: Hemothorax and Chylothorax.   These are variations of the Pneumothorax, where instead of air around the collapsed  lung(s), you may have blood or chyle, respectively.       6. Spinal headaches: They may occur with any procedures in the area of the spine.       7. Persistent CSF (Cerebro-Spinal Fluid) leakage: This is a rare problem, but may occur  with prolonged intrathecal or epidural catheters either due to the formation of a fistulous  track or a dural tear.       8. Nerve damage: By working so close to the spinal cord, there is always a possibility of  nerve damage, which could be as serious as a permanent spinal cord   injury with  paralysis.       9. Death:  Although rare, severe deadly allergic reactions known as "Anaphylactic  reaction" can occur to any of the medications used.      10. Worsening of the symptoms:  We can always make thing worse.  What are the chances of something like this happening? Chances of any of this occuring are extremely low.  By statistics, you have more of a chance of getting killed in a motor vehicle accident: while driving to the hospital than any of the above occurring .  Nevertheless, you should be aware that they are possibilities.  In general, it is similar to taking a shower.  Everybody knows that you can slip, hit your head and get killed.  Does that mean that you should not shower again?  Nevertheless always keep in mind that statistics do not mean anything if you happen to be on the wrong side of them.  Even if a procedure has a 1 (one) in a 1,000,000  (million) chance of going wrong, it you happen to be that one..Also, keep in mind that by statistics, you have more of a chance of having something go wrong when taking medications.  Who should not have this procedure? If you are on a blood thinning medication (e.g. Coumadin, Plavix, see list of "Blood Thinners"), or if you have an active infection going on, you should not have the procedure.  If you are taking any blood thinners, please inform your physician.  How should I prepare for this procedure?  Do not eat or drink anything at least six hours prior to the procedure.  Bring a driver with you .  It cannot be a taxi.  Come accompanied by an adult that can drive you back, and that is strong enough to help you if your legs get weak or numb from the local anesthetic.  Take all of your medicines the morning of the procedure with just enough water to swallow them.  If you have diabetes, make sure that you are scheduled to have your procedure done first thing in the morning, whenever possible.  If you have diabetes, take only half of your insulin dose and notify our nurse that you have done so as soon as you arrive at the clinic.  If you are diabetic, but only take blood sugar pills (oral hypoglycemic), then do not take them on the morning of your procedure.  You may take them after you have had the procedure.  Do not take aspirin or any aspirin-containing medications, at least eleven (11) days prior to the procedure.  They may prolong bleeding.  Wear loose fitting clothing that may be easy to take off and that you would not mind if it got stained with Betadine or blood.  Do not wear any jewelry or perfume  Remove any nail coloring.  It will interfere with some of our monitoring equipment.  NOTE: Remember that this is not meant to be interpreted as a complete list of all possible complications.  Unforeseen problems may occur.  BLOOD THINNERS The following drugs contain aspirin or other  products, which can cause increased bleeding during surgery and should not be taken for 2 weeks prior to and 1 week after surgery.  If you should need take something for relief of minor pain, you may take acetaminophen which is found in Tylenol,m Datril, Anacin-3 and Panadol. It is not blood thinner. The products listed below are.  Do not take any of   the products listed below in addition to any listed on your instruction sheet.  A.P.C or A.P.C with Codeine Codeine Phosphate Capsules #3 Ibuprofen Ridaura  ABC compound Congesprin Imuran rimadil  Advil Cope Indocin Robaxisal  Alka-Seltzer Effervescent Pain Reliever and Antacid Coricidin or Coricidin-D  Indomethacin Rufen  Alka-Seltzer plus Cold Medicine Cosprin Ketoprofen S-A-C Tablets  Anacin Analgesic Tablets or Capsules Coumadin Korlgesic Salflex  Anacin Extra Strength Analgesic tablets or capsules CP-2 Tablets Lanoril Salicylate  Anaprox Cuprimine Capsules Levenox Salocol  Anexsia-D Dalteparin Magan Salsalate  Anodynos Darvon compound Magnesium Salicylate Sine-off  Ansaid Dasin Capsules Magsal Sodium Salicylate  Anturane Depen Capsules Marnal Soma  APF Arthritis pain formula Dewitt's Pills Measurin Stanback  Argesic Dia-Gesic Meclofenamic Sulfinpyrazone  Arthritis Bayer Timed Release Aspirin Diclofenac Meclomen Sulindac  Arthritis pain formula Anacin Dicumarol Medipren Supac  Analgesic (Safety coated) Arthralgen Diffunasal Mefanamic Suprofen  Arthritis Strength Bufferin Dihydrocodeine Mepro Compound Suprol  Arthropan liquid Dopirydamole Methcarbomol with Aspirin Synalgos  ASA tablets/Enseals Disalcid Micrainin Tagament  Ascriptin Doan's Midol Talwin  Ascriptin A/D Dolene Mobidin Tanderil  Ascriptin Extra Strength Dolobid Moblgesic Ticlid  Ascriptin with Codeine Doloprin or Doloprin with Codeine Momentum Tolectin  Asperbuf Duoprin Mono-gesic Trendar  Aspergum Duradyne Motrin or Motrin IB Triminicin  Aspirin plain, buffered or enteric  coated Durasal Myochrisine Trigesic  Aspirin Suppositories Easprin Nalfon Trillsate  Aspirin with Codeine Ecotrin Regular or Extra Strength Naprosyn Uracel  Atromid-S Efficin Naproxen Ursinus  Auranofin Capsules Elmiron Neocylate Vanquish  Axotal Emagrin Norgesic Verin  Azathioprine Empirin or Empirin with Codeine Normiflo Vitamin E  Azolid Emprazil Nuprin Voltaren  Bayer Aspirin plain, buffered or children's or timed BC Tablets or powders Encaprin Orgaran Warfarin Sodium  Buff-a-Comp Enoxaparin Orudis Zorpin  Buff-a-Comp with Codeine Equegesic Os-Cal-Gesic   Buffaprin Excedrin plain, buffered or Extra Strength Oxalid   Bufferin Arthritis Strength Feldene Oxphenbutazone   Bufferin plain or Extra Strength Feldene Capsules Oxycodone with Aspirin   Bufferin with Codeine Fenoprofen Fenoprofen Pabalate or Pabalate-SF   Buffets II Flogesic Panagesic   Buffinol plain or Extra Strength Florinal or Florinal with Codeine Panwarfarin   Buf-Tabs Flurbiprofen Penicillamine   Butalbital Compound Four-way cold tablets Penicillin   Butazolidin Fragmin Pepto-Bismol   Carbenicillin Geminisyn Percodan   Carna Arthritis Reliever Geopen Persantine   Carprofen Gold's salt Persistin   Chloramphenicol Goody's Phenylbutazone   Chloromycetin Haltrain Piroxlcam   Clmetidine heparin Plaquenil   Cllnoril Hyco-pap Ponstel   Clofibrate Hydroxy chloroquine Propoxyphen         Before stopping any of these medications, be sure to consult the physician who ordered them.  Some, such as Coumadin (Warfarin) are ordered to prevent or treat serious conditions such as "deep thrombosis", "pumonary embolisms", and other heart problems.  The amount of time that you may need off of the medication may also vary with the medication and the reason for which you were taking it.  If you are taking any of these medications, please make sure you notify your pain physician before you undergo any procedures.    Radiofrequency  Lesioning Radiofrequency lesioning is a procedure that is performed to relieve pain. The procedure is often used for back, neck, or arm pain. Radiofrequency lesioning involves the use of a machine that creates radio waves to make heat. During the procedure, the heat is applied to the nerve that carries the pain signal. The heat damages the nerve and interferes with the pain signal. Pain relief usually starts about 2 weeks after the procedure and lasts for 6   months to 1 year. Tell a health care provider about:  Any allergies you have.  All medicines you are taking, including vitamins, herbs, eye drops, creams, and over-the-counter medicines.  Any problems you or family members have had with anesthetic medicines.  Any blood disorders you have.  Any surgeries you have had.  Any medical conditions you have.  Whether you are pregnant or may be pregnant. What are the risks? Generally, this is a safe procedure. However, problems may occur, including:  Pain or soreness at the injection site.  Infection at the injection site.  Damage to nerves or blood vessels.  What happens before the procedure?  Ask your health care provider about: ? Changing or stopping your regular medicines. This is especially important if you are taking diabetes medicines or blood thinners. ? Taking medicines such as aspirin and ibuprofen. These medicines can thin your blood. Do not take these medicines before your procedure if your health care provider instructs you not to.  Follow instructions from your health care provider about eating or drinking restrictions.  Plan to have someone take you home after the procedure.  If you go home right after the procedure, plan to have someone with you for 24 hours. What happens during the procedure?  You will be given one or more of the following: ? A medicine to help you relax (sedative). ? A medicine to numb the area (local anesthetic).  You will be awake during the  procedure. You will need to be able to talk with the health care provider during the procedure.  With the help of a type of X-ray (fluoroscopy), the health care provider will insert a radiofrequency needle into the area to be treated.  Next, a wire that carries the radio waves (electrode) will be put through the radiofrequency needle. An electrical pulse will be sent through the electrode to verify the correct nerve. You will feel a tingling sensation, and you may have muscle twitching.  Then, the tissue that is around the needle tip will be heated by an electric current that is passed using the radiofrequency machine. This will numb the nerves.  A bandage (dressing) will be put on the insertion area after the procedure is done. The procedure may vary among health care providers and hospitals. What happens after the procedure?  Your blood pressure, heart rate, breathing rate, and blood oxygen level will be monitored often until the medicines you were given have worn off.  Return to your normal activities as directed by your health care provider. This information is not intended to replace advice given to you by your health care provider. Make sure you discuss any questions you have with your health care provider. Document Released: 07/10/2011 Document Revised: 04/18/2016 Document Reviewed: 12/19/2014 Elsevier Interactive Patient Education  2018 [employer]  

## 2018-11-05 NOTE — Progress Notes (Signed)
Safety precautions to be maintained throughout the outpatient stay will include: orient to surroundings, keep bed in low position, maintain call bell within reach at all times, provide assistance with transfer out of bed and ambulation.  

## 2018-11-06 ENCOUNTER — Encounter: Payer: Self-pay | Admitting: Nurse Practitioner

## 2018-11-06 ENCOUNTER — Ambulatory Visit: Payer: Medicare Other | Admitting: Nurse Practitioner

## 2018-11-06 VITALS — BP 104/58 | HR 50 | Ht 71.0 in | Wt 211.5 lb

## 2018-11-06 DIAGNOSIS — I10 Essential (primary) hypertension: Secondary | ICD-10-CM

## 2018-11-06 DIAGNOSIS — I5032 Chronic diastolic (congestive) heart failure: Secondary | ICD-10-CM | POA: Diagnosis not present

## 2018-11-06 DIAGNOSIS — Z952 Presence of prosthetic heart valve: Secondary | ICD-10-CM | POA: Diagnosis not present

## 2018-11-06 DIAGNOSIS — I4821 Permanent atrial fibrillation: Secondary | ICD-10-CM

## 2018-11-06 NOTE — Patient Instructions (Signed)
Medication Instructions:  Your physician recommends that you continue on your current medications as directed. Please refer to the Current Medication list given to you today.  If you need a refill on your cardiac medications before your next appointment, please call your pharmacy.   Lab work: None ordered   If you have labs (blood work) drawn today and your tests are completely normal, you will receive your results only by: . MyChart Message (if you have MyChart) OR . A paper copy in the mail If you have any lab test that is abnormal or we need to change your treatment, we will call you to review the results.  Testing/Procedures: None ordered   Follow-Up: At [facility], you and your health needs are our priority.  As part of our continuing mission to provide you with exceptional heart care, we have created designated Provider Care Teams.  These Care Teams include your primary Cardiologist (physician) and Advanced Practice Providers (APPs -  Physician Assistants and Nurse Practitioners) who all work together to provide you with the care you need, when you need it. You will need a follow up appointment in 3 months.  You may see Timothy Gollan, MD or one of the following Advanced Practice Providers on your designated Care Team:   Christopher Berge, NP Ryan Dunn, PA-C . Jacquelyn Visser, PA-C  

## 2018-11-06 NOTE — Progress Notes (Signed)
Office Visit    Patient Name: [name] Date of Encounter: 11/06/2018  Primary Care Provider:  Gracelyn Nurse, MD Primary Cardiologist:  Julien Nordmann, MD  Chief Complaint    84 year old male with a history of permanent atrial fibrillation, aortic stenosis status post mechanical AVR on chronic Coumadin, chronic diastolic congestive heart failure, nonobstructive CAD, hypertension, hyperlipidemia, GERD, and chronic dyspnea, who presents for follow-up related to heart failure symptoms.  Past Medical History    Past Medical History:  Diagnosis Date  . Anxiety 10/11  . Aortic stenosis    a. s/p mechcanical AVR, 2002; b. 10/2018 Echo: Triv AI, mean grad .  . Bradycardia    chronic, no symptoms 07/2010  . C. difficile colitis   . Carotid bruit    dopplers in past, no abnormalities  . Chronic diastolic CHF (congestive heart failure) (HCC)    a. Echo 03/2015: EF 60-65%, no RWMA, GR1DD, mild BAE, mild to mod MR, mod TR, PASP 65 mmHg; b. 01/2017 Echo: EF 55-60%, NRWMA, grade 1 diastolic dysfunction.  Normal functioning prosthetic aortic valve.  Mean gradient 50 mmHg.  Sev TR. PASP ; c. 10/2018 Echo: EF 55-60%, Triv AI, mod dil LA, mod-sev TR, PASP 35-40, mild to mod red RV fxn.  . Coronary artery disease    a. mild, cath, 08/2010; b. medically managed  . Decreased hearing    Right ear  . Depression   . Gastric ulcer   . GERD (gastroesophageal reflux disease)   . Hypertension    BP higher than usual 04/19/10; amlodipine increased by telephone  . Mod-Sev Tricuspid regurgitation    a. 10/2018 Echo: Mod-Sev TR, PASP 35-63mmHg.  Marland Kitchen RLS (restless legs syndrome) 08/23/2015  . S/P AVR    a. [employer]. mechanical 2002; b. echo 08/2010 EF 60%, trival AI, mild MR, AVR working well; c. on longterm warfarin tx  . SOB (shortness of breath) 10/11   08/2010,Episodes at 5 AM, eventually felt to be anxiety, after complete workup including catheterization, pt greatly improved with  anxiety meds 11/11   Past Surgical History:  Procedure Laterality Date  . CARDIAC CATHETERIZATION    . ESOPHAGOGASTRODUODENOSCOPY N/A 04/05/2015   Procedure: ESOPHAGOGASTRODUODENOSCOPY (EGD);  Surgeon: Scot Jun, MD;  Location: [facility] ENDOSCOPY;  Service: Endoscopy;  Laterality: N/A;  . ESOPHAGOGASTRODUODENOSCOPY N/A 04/17/2015   Procedure: ESOPHAGOGASTRODUODENOSCOPY (EGD);  Surgeon: Scot Jun, MD;  Location: [facility] ENDOSCOPY;  Service: Endoscopy;  Laterality: N/A;  . ESOPHAGOGASTRODUODENOSCOPY N/A 08/02/2015   Procedure: ESOPHAGOGASTRODUODENOSCOPY (EGD);  Surgeon: Wallace Cullens, MD;  Location: [facility] ENDOSCOPY;  Service: Endoscopy;  Laterality: N/A;  . HERNIA REPAIR    . JOINT REPLACEMENT    . TOTAL HIP ARTHROPLASTY    . VALVE REPLACEMENT  1/02   Aortic; echo 3/09 valve working well; echo 10/11 working well; put on Coumadin    Allergies  Allergies  Allergen Reactions  . Levofloxacin Nausea Only  . Sulfa Antibiotics Other (See Comments)    Reaction:  Unknown     History of Present Illness    84 year old male with above complex past medical history including aortic stenosis status post mechanical AVR on chronic Coumadin anticoagulation, permanent atrial fibrillation, chronic diastolic congestive heart failure with an EF of 55 to 60%, hypertension, hyperlipidemia, chronic dyspnea, GERD, and depression.  He was seen in clinic on December 3 in the setting of worsening dyspnea with cough and chest congestion despite antibiotic and inhaler therapy.  A chest x-ray was performed that  morning which showed interstitial edema.  His BNP was 467 that day.  He was volume overloaded on exam and reported that he had not taken Lasix in 2 days.  His weight was up 3 to 4 pounds.  I asked him to take Lasix 40 mg twice daily x3 days and then drop down to once daily and encouraged him to be sure not to skip doses of Lasix.  I also obtained an echocardiogram which showed stable/normal LV function, trivial AI,  moderate to severe TR (stable), and a PASP of 35 to 40 mmHg.  Overall, there were no significant changes on echo.  Since his last visit, he has done much better.  His weight is down 3 pounds on our scale.  He has noted improvement in exercise tolerance and also reduction in lower extremity swelling.  He has not had any chest pain and denies PND, orthopnea, palpitations, dizziness, syncope, or early satiety.  Home Medications    Prior to Admission medications   Medication Sig Start Date End Date Taking? Authorizing Provider  Cetirizine HCl 10 MG CAPS Take 10 mg by mouth daily.   Yes [provider]  citalopram (CELEXA) 20 MG tablet Take 20 mg by mouth daily.   Yes [provider]  cloNIDine (CATAPRES) 0.1 MG tablet Take 1 tablet (0.1 mg total) by mouth 2 (two) times daily. 03/02/18  Yes Gollan, Tollie Pizzaimothy J, MD  doxazosin (CARDURA) 8 MG tablet TAKE 1 TABLET BY MOUTH  TWICE A DAY 03/02/18  Yes Gollan, Tollie Pizzaimothy J, MD  finasteride (PROSCAR) 5 MG tablet Take 5 mg by mouth daily.     Yes [provider]  furosemide (LASIX) 40 MG tablet Take 1 tablet (40 mg total) by mouth daily. 09/21/18  Yes Antonieta IbaGollan, Timothy J, MD  isosorbide mononitrate (IMDUR) 60 MG 24 hr tablet Take 1 tablet (60 mg total) by mouth 2 (two) times daily. 09/16/18  Yes Antonieta IbaGollan, Timothy J, MD  metoprolol succinate (TOPROL-XL) 25 MG 24 hr tablet Take 1 tablet (25 mg total) by mouth daily. 09/21/18  Yes Gollan, Tollie Pizzaimothy J, MD  potassium chloride (K-DUR) 10 MEQ tablet Take 1 tablet (10 mEq total) by mouth daily. 09/10/17  Yes Gollan, Tollie Pizzaimothy J, MD  ranitidine (ZANTAC) 150 MG tablet Take 150 mg by mouth as needed.   Yes [provider]  rOPINIRole (REQUIP) 2 MG tablet TAKE 1 TABLET BY MOUTH 4  TIMES DAILY 09/14/18  Yes Butch PennyMillikan, Megan, NP  Tamsulosin HCl (FLOMAX) 0.4 MG CAPS Take 0.4 mg by mouth at bedtime.    Yes [provider]  traZODone (DESYREL) 50 MG tablet Take 50 mg by mouth at bedtime.   Yes  [provider]  warfarin (COUMADIN) 5 MG tablet Take 1 tablet daily except 1 and 1/2 tabs on Monday, Wednesday, and Friday or as directed by [facility] Clinic. 10/06/18  Yes Antonieta IbaGollan, Timothy J, MD    Review of Systems    Some degree of chronic dyspnea, which is now back to baseline.  He denies chest pain, palpitations, PND, orthopnea, dizziness, syncope, or early satiety.  He has chronic, mild lower extremity swelling in the setting of chronic varices and venous stasis but this has improved since his last visit.  All other systems reviewed and are otherwise negative except as noted above.  Physical Exam    VS:  BP (!) 104/58 (BP Location: Left Arm, Patient Position: Sitting, Cuff Size: Normal)   Pulse (!) 50   Ht 5\' 11"  (1.803 m)  Wt 211 lb 8 oz (95.9 kg)   BMI 29.50 kg/m  , BMI Body mass index is 29.5 kg/m. GEN: Well nourished, well developed, in no acute distress. HEENT: normal. Neck: Supple, no JVD, carotid bruits, or masses. Cardiac: IR, IR, 2/6 syst murmur @ upper sternal border and LLSB, no rubs, or gallops. No clubbing, cyanosis, trace bilat LE edema w/ varicosities and chronic venous stasis changes.  Radials/PT 2+ and equal bilaterally.  Respiratory:  Respirations regular and unlabored, clear to auscultation bilaterally. GI: Soft, nontender, nondistended, BS + x 4. MS: no deformity or atrophy. Skin: warm and dry, no rash. Neuro:  Strength and sensation are intact. Psych: Normal affect.  Accessory Clinical Findings    ECG personally reviewed by me today -atrial fibrillation, 50- no acute changes.  Assessment & Plan    1.  Chronic diastolic ingestive heart failure: Patient was seen December 3 with 3 to 4 pound weight gain, increasing dyspnea, JVD, and interstitial edema on chest x-ray.  I had him double his Lasix for 3 days and he has since been taking 40 mg daily.  With this, he has had weight loss back to baseline and improvement in symptoms and edema.  Heart rate  and blood pressure stable today.  Echocardiogram performed after his last visit continue to show normal LV function.  Continue current regimen.  We discussed the importance of daily weights, sodium restriction, medication compliance, and symptom reporting and he verbalizes understanding.   2.  Permanent atrial fibrillation: Rate controlled on beta-blocker therapy.  He remains on Coumadin in the setting of AVR.  3.  History of mechanical aortic valve: Echo showed trivial AI with a mean gradient of 9 mmHg.  Chronic Coumadin.  4.  Essential hypertension: Stable.  He responded well to diuresis.  He remains on clonidine, doxazosin, Lasix, nitrate, beta-blocker.  5.  Disposition: Follow-up in clinic in 3 months or sooner if necessary.   Nicolasa Ducking, NP 11/06/2018, 12:55 PM

## 2018-11-09 ENCOUNTER — Ambulatory Visit: Payer: Medicare Other

## 2018-11-09 ENCOUNTER — Other Ambulatory Visit: Payer: Medicare Other

## 2018-11-09 DIAGNOSIS — Z5181 Encounter for therapeutic drug level monitoring: Secondary | ICD-10-CM

## 2018-11-09 DIAGNOSIS — I350 Nonrheumatic aortic (valve) stenosis: Secondary | ICD-10-CM

## 2018-11-09 DIAGNOSIS — Z7901 Long term (current) use of anticoagulants: Secondary | ICD-10-CM

## 2018-11-09 LAB — POCT INR: INR: 2.7 (ref 2.0–3.0)

## 2018-11-09 NOTE — Patient Instructions (Signed)
Please continue dosage of 1 tablet every day except 1.5 on Mondays, Wednesdays & Fridays.  Recheck in 3 weeks.

## 2018-11-11 ENCOUNTER — Ambulatory Visit: Payer: Medicare Other | Admitting: Podiatry

## 2018-11-11 ENCOUNTER — Encounter: Payer: Self-pay | Admitting: Podiatry

## 2018-11-11 VITALS — BP 137/67

## 2018-11-11 DIAGNOSIS — Z9229 Personal history of other drug therapy: Secondary | ICD-10-CM | POA: Diagnosis not present

## 2018-11-11 DIAGNOSIS — L84 Corns and callosities: Secondary | ICD-10-CM | POA: Diagnosis not present

## 2018-11-11 DIAGNOSIS — B351 Tinea unguium: Secondary | ICD-10-CM

## 2018-11-11 DIAGNOSIS — M79675 Pain in left toe(s): Secondary | ICD-10-CM | POA: Diagnosis not present

## 2018-11-11 DIAGNOSIS — M79674 Pain in right toe(s): Secondary | ICD-10-CM | POA: Diagnosis not present

## 2018-11-11 NOTE — Patient Instructions (Addendum)
Onychomycosis/Fungal Toenails  WHAT IS IT? An infection that lies within the keratin of your nail plate that is caused by a fungus.  WHY ME? Fungal infections affect all ages, sexes, races, and creeds.  There may be many factors that predispose you to a fungal infection such as age, coexisting medical conditions such as diabetes, or an autoimmune disease; stress, medications, fatigue, genetics, etc.  Bottom line: fungus thrives in a warm, moist environment and your shoes offer such a location.  IS IT CONTAGIOUS? Theoretically, yes.  You do not want to share shoes, nail clippers or files with someone who has fungal toenails.  Walking around barefoot in the same room or sleeping in the same bed is unlikely to transfer the organism.  It is important to realize, however, that fungus can spread easily from one nail to the next on the same foot.  HOW DO WE TREAT THIS?  There are several ways to treat this condition.  Treatment may depend on many factors such as age, medications, pregnancy, liver and kidney conditions, etc.  It is best to ask your doctor which options are available to you.  1. No treatment.   Unlike many other medical concerns, you can live with this condition.  However for many people this can be a painful condition and may lead to ingrown toenails or a bacterial infection.  It is recommended that you keep the nails cut short to help reduce the amount of fungal nail. 2. Topical treatment.  These range from herbal remedies to prescription strength nail lacquers.  About 40-50% effective, topicals require twice daily application for approximately 9 to 12 months or until an entirely new nail has grown out.  The most effective topicals are medical grade medications available through physicians offices. 3. Oral antifungal medications.  With an 80-90% cure rate, the most common oral medication requires 3 to 4 months of therapy and stays in your system for a year as the new nail grows out.  Oral  antifungal medications do require blood work to make sure it is a safe drug for you.  A liver function panel will be performed prior to starting the medication and after the first month of treatment.  It is important to have the blood work performed to avoid any harmful side effects.  In general, this medication safe but blood work is required. 4. Laser Therapy.  This treatment is performed by applying a specialized laser to the affected nail plate.  This therapy is noninvasive, fast, and non-painful.  It is not covered by insurance and is therefore, out of pocket.  The results have been very good with a 80-95% cure rate.  The [facility] is the only practice in the area to offer this therapy. 5. Permanent Nail Avulsion.  Removing the entire nail so that a new nail will not grow back.  Corns and Calluses Corns are small areas of thickened skin that occur on the top, sides, or tip of a toe. They contain a cone-shaped core with a point that can press on a nerve below. This causes pain.  Calluses are areas of thickened skin that can occur anywhere on the body, including the hands, fingers, palms, soles of the feet, and heels. Calluses are usually larger than corns. What are the causes? Corns and calluses are caused by rubbing (friction) or pressure, such as from shoes that are too tight or do not fit properly. What increases the risk? Corns are more likely to develop in people   who have misshapen toes (toe deformities), such as hammer toes. Calluses can occur with friction to any area of the skin. They are more likely to develop in people who:  Work with their hands.  Wear shoes that fit poorly, are too tight, or are high-heeled.  Have toe deformities. What are the signs or symptoms? Symptoms of a corn or callus include:  A hard growth on the skin.  Pain or tenderness under the skin.  Redness and swelling.  Increased discomfort while wearing tight-fitting shoes, if your feet are  affected. If a corn or callus becomes infected, symptoms may include:  Redness and swelling that gets worse.  Pain.  Fluid, blood, or pus draining from the corn or callus. How is this diagnosed? Corns and calluses may be diagnosed based on your symptoms, your medical history, and a physical exam. How is this treated? Treatment for corns and calluses may include:  Removing the cause of the friction or pressure. This may involve: ? Changing your shoes. ? Wearing shoe inserts (orthotics) or other protective layers in your shoes, such as a corn pad. ? Wearing gloves.  Applying medicine to the skin (topical medicine) to help soften skin in the hardened, thickened areas.  Removing layers of dead skin with a file to reduce the size of the corn or callus.  Removing the corn or callus with a scalpel or laser.  Taking antibiotic medicines, if your corn or callus is infected.  Having surgery, if a toe deformity is the cause. Follow these instructions at home:   Take over-the-counter and prescription medicines only as told by your health care provider.  If you were prescribed an antibiotic, take it as told by your health care provider. Do not stop taking it even if your condition starts to improve.  Wear shoes that fit well. Avoid wearing high-heeled shoes and shoes that are too tight or too loose.  Wear any padding, protective layers, gloves, or orthotics as told by your health care provider.  Soak your hands or feet and then use a file or pumice stone to soften your corn or callus. Do this as told by your health care provider.  Check your corn or callus every day for symptoms of infection. Contact a health care provider if you:  Notice that your symptoms do not improve with treatment.  Have redness or swelling that gets worse.  Notice that your corn or callus becomes painful.  Have fluid, blood, or pus coming from your corn or callus.  Have new symptoms. Summary  Corns are  small areas of thickened skin that occur on the top, sides, or tip of a toe.  Calluses are areas of thickened skin that can occur anywhere on the body, including the hands, fingers, palms, and soles of the feet. Calluses are usually larger than corns.  Corns and calluses are caused by rubbing (friction) or pressure, such as from shoes that are too tight or do not fit properly.  Treatment may include wearing any padding, protective layers, gloves, or orthotics as told by your health care provider. This information is not intended to replace advice given to you by your health care provider. Make sure you discuss any questions you have with your health care provider. Document Released: 08/17/2004 Document Revised: 09/24/2017 Document Reviewed: 09/24/2017 Elsevier Interactive Patient Education  2019 [employer]  

## 2018-11-13 ENCOUNTER — Telehealth: Payer: Self-pay | Admitting: *Deleted

## 2018-11-13 NOTE — Telephone Encounter (Signed)
-----   Message from Antonieta Ibaimothy J Gollan, MD sent at 11/07/2018  3:45 PM EST ----- Echo  Normal ejection fraction, >55% Mildly elevated right heart pressures, Would stay on diuretic Well functioning aortic valve

## 2018-11-13 NOTE — Telephone Encounter (Signed)
Left voicemail message to call back for echocardiogram results.  

## 2018-11-16 NOTE — Telephone Encounter (Signed)
Call attempted. LMTCB  

## 2018-11-16 NOTE — Telephone Encounter (Signed)
Call to patient, discussed Echo results.   Pt verbalized understanding. Advised pt to call for any further questions or concerns

## 2018-11-16 NOTE — Telephone Encounter (Signed)
Patient calling to discuss recent testing results  ° °Please call  ° °

## 2018-11-23 ENCOUNTER — Telehealth: Payer: Self-pay | Admitting: Cardiovascular Disease

## 2018-11-23 NOTE — Telephone Encounter (Signed)
Agree with RN note.  Please route to provider following patient as an FYI.

## 2018-11-23 NOTE — Telephone Encounter (Signed)
Pt c/o Shortness Of Breath: STAT if SOB developed within the last 24 hours or pt is noticeably SOB on the phone  1. Are you currently SOB (can you hear that pt is SOB on the phone)? A little  2. How long have you been experiencing SOB? Started Friday   3. Are you SOB when sitting or when up moving around? When moving around   4. Are you currently experiencing any other symptoms? Wheezing, coughing, coughing up greenish color stuff   Patient calling, wants to schedule to see someone, declined next available appointment on 1/7, wants to speak with nurse.  Please call.

## 2018-11-23 NOTE — Telephone Encounter (Signed)
Pt reports wheezing, SOB on exertion and cough w green sputum. Denies fevers. Pt denies leg swelling or weight gain. Is sleeping with CPAP on back w/o difficulty. [facility] [place] and was believed to have a cold with post nasal drip.  No audible wheezing or SOB heard on phone. Pt speaks in full sentences.   He reported taking Coricidin for cold like sx but had diarrhea from it so he stopped.   I suggested that pt try a warm shower and tea with honey and lemon for congestion issues. He verbalized that he would call PCP for further cold like sx.   I suggested that he call us back if he has any weight gain of greater than 2 pounds in day or 5 pounds in a week. He should call for lower extremity swelling or SOB related to fluid volume over load including sleeping on more pillows or sitting up.    Advised pt to call for any further questions or concerns

## 2018-11-24 ENCOUNTER — Emergency Department: Payer: Medicare Other

## 2018-11-24 ENCOUNTER — Other Ambulatory Visit: Payer: Self-pay

## 2018-11-24 ENCOUNTER — Emergency Department
Admission: EM | Admit: 2018-11-24 | Discharge: 2018-11-24 | Disposition: A | Discharge status: Home / Self Care | Payer: Medicare Other | Attending: Emergency Medicine | Admitting: Emergency Medicine

## 2018-11-24 DIAGNOSIS — I110 Hypertensive heart disease with heart failure: Secondary | ICD-10-CM | POA: Diagnosis not present

## 2018-11-24 DIAGNOSIS — Z7901 Long term (current) use of anticoagulants: Secondary | ICD-10-CM | POA: Diagnosis not present

## 2018-11-24 DIAGNOSIS — Z87891 Personal history of nicotine dependence: Secondary | ICD-10-CM | POA: Insufficient documentation

## 2018-11-24 DIAGNOSIS — R42 Dizziness and giddiness: Secondary | ICD-10-CM | POA: Insufficient documentation

## 2018-11-24 DIAGNOSIS — Z96649 Presence of unspecified artificial hip joint: Secondary | ICD-10-CM | POA: Diagnosis not present

## 2018-11-24 DIAGNOSIS — I5032 Chronic diastolic (congestive) heart failure: Secondary | ICD-10-CM | POA: Diagnosis not present

## 2018-11-24 DIAGNOSIS — Z79899 Other long term (current) drug therapy: Secondary | ICD-10-CM | POA: Insufficient documentation

## 2018-11-24 DIAGNOSIS — Z952 Presence of prosthetic heart valve: Secondary | ICD-10-CM | POA: Insufficient documentation

## 2018-11-24 LAB — TROPONIN I: Troponin I: <0.03 ng/mL (ref <0.03)

## 2018-11-24 LAB — URINALYSIS, COMPLETE (UACMP) WITH MICROSCOPIC
Bacteria, UA: NONE SEEN
Bilirubin Urine: NEGATIVE
Glucose, UA: NEGATIVE mg/dL
Ketones, ur: NEGATIVE mg/dL
Leukocytes, UA: NEGATIVE
Nitrite: NEGATIVE
Protein, ur: NEGATIVE mg/dL
Specific Gravity, Urine: 1.017 (ref 1.005–1.030)
Squamous Epithelial / HPF: NONE SEEN (ref 0–5)
pH: 5 (ref 5.0–8.0)

## 2018-11-24 LAB — BASIC METABOLIC PANEL
Anion gap: 6 (ref 5–15)
BUN: 16 mg/dL (ref 8–23)
CALCIUM: 8.6 mg/dL — AB (ref 8.9–10.3)
CO2: 29 mmol/L (ref 22–32)
Chloride: 96 mmol/L — ABNORMAL LOW (ref 98–111)
Creatinine, Ser: 0.97 mg/dL (ref 0.61–1.24)
GFR calc Af Amer: >60 mL/min (ref >60)
GFR calc non Af Amer: >60 mL/min (ref >60)
Glucose, Bld: 131 mg/dL — ABNORMAL HIGH (ref 70–99)
Potassium: 4 mmol/L (ref 3.5–5.1)
Sodium: 131 mmol/L — ABNORMAL LOW (ref 135–145)

## 2018-11-24 LAB — CBC
HCT: 36.9 % — ABNORMAL LOW (ref 39.0–52.0)
Hemoglobin: 12.3 g/dL — ABNORMAL LOW (ref 13.0–17.0)
MCH: 30.4 pg (ref 26.0–34.0)
MCHC: 33.3 g/dL (ref 30.0–36.0)
MCV: 91.1 fL (ref 80.0–100.0)
Platelets: 145 10*3/uL — ABNORMAL LOW (ref 150–400)
RBC: 4.05 MIL/uL — ABNORMAL LOW (ref 4.22–5.81)
RDW: 14.4 % (ref 11.5–15.5)
WBC: 4.7 10*3/uL (ref 4.0–10.5)
nRBC: 0 % (ref 0.0–0.2)

## 2018-11-24 MED ORDER — METOPROLOL SUCCINATE ER 25 MG PO TB24: 12.5000 mg | ORAL_TABLET | Freq: Every day | ORAL | 3 refills | Status: DC

## 2018-11-24 NOTE — ED Provider Notes (Signed)
[facility] Emergency Department Provider Note       Time seen: ----------------------------------------- 9:06 PM on 11/24/2018 -----------------------------------------   I have reviewed the triage vital signs and the nursing notes.  HISTORY   Chief Complaint Dizziness    HPI [name] is a 84 y.o. male with a history of anxiety, aortic stenosis, bradycardia, C. difficile colitis, CHF who presents to the ED for dizziness and elevated blood pressure.  Patient took his blood pressure at home and it was over 200 systolic.  Patient noticed it around 1730 hrs. this evening.  Recently has had a URI and has been taking Mucinex.  He denies fevers, chills or other complaints.  Past Medical History:  Diagnosis Date  . Anxiety 10/11  . Aortic stenosis    a. s/p mechcanical AVR, 2002; b. 10/2018 Echo: Triv AI, mean grad 9mmHg.  . Bradycardia    chronic, no symptoms 07/2010  . C. difficile colitis   . Carotid bruit    dopplers in past, no abnormalities  . Chronic diastolic CHF (congestive heart failure) (HCC)    a. Echo 03/2015: EF 60-65%, no RWMA, GR1DD, mild BAE, mild to mod MR, mod TR, PASP 65 mmHg; b. 01/2017 Echo: EF 55-60%, NRWMA, grade 1 diastolic dysfunction.  Normal functioning prosthetic aortic valve.  Mean gradient 50 mmHg.  Sev TR. PASP 54mmHg; c. 10/2018 Echo: EF 55-60%, Triv AI, mod dil LA, mod-sev TR, PASP 35-40, mild to mod red RV fxn.  . Coronary artery disease    a. mild, cath, 08/2010; b. medically managed  . Decreased hearing    Right ear  . Depression   . Gastric ulcer   . GERD (gastroesophageal reflux disease)   . Hypertension    BP higher than usual 04/19/10; amlodipine increased by telephone  . Mod-Sev Tricuspid regurgitation    a. 10/2018 Echo: Mod-Sev TR, PASP 35-3640mmHg.  Marland Kitchen. RLS (restless legs syndrome) 08/23/2015  . S/P AVR    a. [employer]. mechanical 2002; b. echo 08/2010 EF 60%, trival AI, mild MR, AVR working well; c. on  longterm warfarin tx  . SOB (shortness of breath) 10/11   08/2010,Episodes at 5 AM, eventually felt to be anxiety, after complete workup including catheterization, pt greatly improved with anxiety meds 11/11    Patient Active Problem List   Diagnosis Date Noted  . H/O atrial flutter 07/15/2018  . Obstructive sleep apnea 10/23/2017  . Chronic diastolic CHF (congestive heart failure) (HCC) 09/24/2017  . Lymphedema 09/24/2017  . Chronic hyponatremia 08/12/2017  . Encounter for anticoagulation discussion and counseling 02/24/2017  . Bilateral leg edema 09/25/2015  . RLS (restless legs syndrome) 08/23/2015  . C. difficile colitis 05/26/2015  . Blood loss anemia   . S/P AVR (aortic valve replacement)   . Anemia 04/02/2015  . GERD (gastroesophageal reflux disease)   . Bradycardia   . Depression   . Anxiety   . Hypertension   . Decreased hearing   . Coronary artery disease   . Aortic stenosis   . Warfarin anticoagulation   . Carotid bruit     Past Surgical History:  Procedure Laterality Date  . CARDIAC CATHETERIZATION    . ESOPHAGOGASTRODUODENOSCOPY N/A 04/05/2015   Procedure: ESOPHAGOGASTRODUODENOSCOPY (EGD);  Surgeon: [name], MD;  [facility];  Service: Endoscopy;  Laterality: N/A;  . ESOPHAGOGASTRODUODENOSCOPY N/A 04/17/2015   Procedure: ESOPHAGOGASTRODUODENOSCOPY (EGD);  Surgeon: [name], MD;  [facility];  Service: Endoscopy;  Laterality: N/A;  .  ESOPHAGOGASTRODUODENOSCOPY N/A 08/02/2015   Procedure: ESOPHAGOGASTRODUODENOSCOPY (EGD);  Surgeon: Wallace Cullens, MD;  Location: [facility] ENDOSCOPY;  Service: Endoscopy;  Laterality: N/A;  . HERNIA REPAIR    . JOINT REPLACEMENT    . TOTAL HIP ARTHROPLASTY    . VALVE REPLACEMENT  1/02   Aortic; echo 3/09 valve working well; echo 10/11 working well; put on Coumadin    Allergies Levofloxacin and Sulfa antibiotics  Social History Social History   Tobacco Use  . Smoking status: Former Smoker     Types: Cigarettes    Last attempt to quit: 1979    Years since quitting: 41.0  . Smokeless tobacco: Never Used  Substance Use Topics  . Alcohol use: Yes    Alcohol/week: 3.0 standard drinks    Types: 3 Glasses of wine per week    Comment: Social  . Drug use: No   Review of Systems Constitutional: Negative for fever. Cardiovascular: Negative for chest pain. Respiratory: Negative for shortness of breath. Gastrointestinal: Negative for abdominal pain, vomiting and diarrhea. Musculoskeletal: Negative for back pain. Skin: Negative for rash. Neurological: Positive for dizziness  All systems negative/normal/unremarkable except as stated in the HPI  ____________________________________________   PHYSICAL EXAM:  VITAL SIGNS: ED Triage Vitals  Enc Vitals Group     BP 11/24/18 1948 137/74     Pulse Rate 11/24/18 1948 61     Resp 11/24/18 1948 18     Temp 11/24/18 1948 (!) 97.5 F (36.4 C)     Temp Source 11/24/18 1948 Oral     SpO2 11/24/18 1948 97 %     Weight 11/24/18 1949 205 lb (93 kg)     Height 11/24/18 1949 5\' 11"  (1.803 m)     Head Circumference --      Peak Flow --      Pain Score 11/24/18 1948 0     Pain Loc --      Pain Edu? --      Excl. in GC? --    Constitutional: Alert and oriented. Well appearing and in no distress. Eyes: Conjunctivae are normal. Normal extraocular movements. ENT   Head: Normocephalic and atraumatic.   Nose: No congestion/rhinnorhea.   Mouth/Throat: Mucous membranes are moist.   Neck: No stridor. Cardiovascular: Slow rate, irregular rhythm. No murmurs, rubs, or gallops. Respiratory: Normal respiratory effort without tachypnea nor retractions. Breath sounds are clear and equal bilaterally. No wheezes/rales/rhonchi. Gastrointestinal: Soft and nontender. Normal bowel sounds Musculoskeletal: Nontender with normal range of motion in extremities. No lower extremity tenderness nor edema. Neurologic:  Normal speech and language. No  gross focal neurologic deficits are appreciated.  Skin:  Skin is warm, dry and intact. No rash noted. Psychiatric: Mood and affect are normal. Speech and behavior are normal.  ____________________________________________  EKG: Interpreted by me.  Atrial fibrillation with a slow ventricular response, rate is 50 bpm, normal QRS, normal axis  ____________________________________________  ED COURSE:  As part of my medical decision making, I reviewed the following data within the electronic MEDICAL RECORD NUMBER History obtained from family if available, nursing notes, old chart and ekg, as well as notes from prior ED visits. Patient presented for dizziness and elevated blood pressure, we will assess with labs and imaging as indicated at this time.   Procedures ____________________________________________   LABS (pertinent positives/negatives)  Labs Reviewed  BASIC METABOLIC PANEL - Abnormal; Notable for the following components:      Result Value   Sodium 131 (*)    Chloride 96 (*)  Glucose, Bld 131 (*)    Calcium 8.6 (*)    All other components within normal limits  CBC - Abnormal; Notable for the following components:   RBC 4.05 (*)    Hemoglobin 12.3 (*)    HCT 36.9 (*)    Platelets 145 (*)    All other components within normal limits  URINALYSIS, COMPLETE (UACMP) WITH MICROSCOPIC - Abnormal; Notable for the following components:   Color, Urine YELLOW (*)    APPearance CLEAR (*)    Hgb urine dipstick SMALL (*)    All other components within normal limits  TROPONIN I  CBG MONITORING, ED    RADIOLOGY  Chest x-ray is normal  ____________________________________________  DIFFERENTIAL DIAGNOSIS   URI, medication side effect, dehydration, electrolyte abnormality, anemia, MI, arrhythmia  FINAL ASSESSMENT AND PLAN  Dizziness   Plan: The patient had presented for dizziness. Patient's labs are at his baseline. Patient's imaging reveal any acute process.  I will decrease  his metoprolol dose but otherwise he is cleared for outpatient follow-up.   Ulice DashJohnathan E Geneve Kimpel, MD   Note: This note was generated in part or whole with voice recognition software. Voice recognition is usually quite accurate but there are transcription errors that can and very often do occur. I apologize for any typographical errors that were not detected and corrected.     Emily FilbertWilliams, Brandace Cargle E, MD 11/24/18 2149

## 2018-11-24 NOTE — ED Triage Notes (Signed)
Pt to the er for dizziness and elevated BP. Pt took it at home and it was 211/95. Pt noticed it about 1730. Pt reports recently has had a URI. Pt taking muccinex. Pt ambulatory with rollator walker.

## 2018-11-30 ENCOUNTER — Ambulatory Visit: Payer: Medicare Other

## 2018-11-30 DIAGNOSIS — Z952 Presence of prosthetic heart valve: Secondary | ICD-10-CM

## 2018-11-30 DIAGNOSIS — I350 Nonrheumatic aortic (valve) stenosis: Secondary | ICD-10-CM

## 2018-11-30 DIAGNOSIS — Z7901 Long term (current) use of anticoagulants: Secondary | ICD-10-CM

## 2018-11-30 LAB — POCT INR: INR: 3.9 — AB (ref 2.0–3.0)

## 2018-11-30 NOTE — Patient Instructions (Signed)
Please take 1/2 tablet tonight, then continue dosage of 1 tablet every day except 1.5 on Mondays, Wednesdays & Fridays.  Recheck in 3 weeks.

## 2018-12-13 ENCOUNTER — Encounter: Payer: Self-pay | Admitting: Podiatry

## 2018-12-13 NOTE — Progress Notes (Signed)
Subjective: [name] is a 85 y.o. y.o. male who presents today with painful, discolored, thick toenails and painful calluses and corns which interfere with daily activities. Pain is aggravated when wearing enclosed shoe gear and weightbearing without shoe gear. Pain is relieved with periodic professional debridement.  Most symptomatic are lesions on the distal tips of his 2nd toes which respond to periodic debridement and non-medicated padding. Patient states he has corns on left foot on the tip/top of his toes. Condition has been present for "years".  He relates seeing several Podiatrists in the past for same condition. He denies any open wounds to areas.  Gracelyn Nurse, MD is his PCP.   Current Outpatient Medications:  .  Cetirizine HCl 10 MG CAPS, Take 10 mg by mouth daily., Disp: , Rfl:  .  citalopram (CELEXA) 20 MG tablet, Take 20 mg by mouth daily., Disp: , Rfl:  .  cloNIDine (CATAPRES) 0.1 MG tablet, Take 1 tablet (0.1 mg total) by mouth 2 (two) times daily., Disp: 180 tablet, Rfl: 3 .  doxazosin (CARDURA) 8 MG tablet, TAKE 1 TABLET BY MOUTH  TWICE A DAY, Disp: 180 tablet, Rfl: 1 .  finasteride (PROSCAR) 5 MG tablet, Take 5 mg by mouth daily.  , Disp: , Rfl:  .  furosemide (LASIX) 40 MG tablet, Take 1 tablet (40 mg total) by mouth daily., Disp: 90 tablet, Rfl: 3 .  isosorbide mononitrate (IMDUR) 60 MG 24 hr tablet, Take 1 tablet (60 mg total) by mouth 2 (two) times daily., Disp: 20 tablet, Rfl: 0 .  potassium chloride (K-DUR) 10 MEQ tablet, Take 1 tablet (10 mEq total) by mouth daily., Disp: 90 tablet, Rfl: 3 .  ranitidine (ZANTAC) 150 MG tablet, Take 150 mg by mouth as needed., Disp: , Rfl:  .  rOPINIRole (REQUIP) 2 MG tablet, TAKE 1 TABLET BY MOUTH 4  TIMES DAILY, Disp: 360 tablet, Rfl: 3 .  Tamsulosin HCl (FLOMAX) 0.4 MG CAPS, Take 0.4 mg by mouth at bedtime. , Disp: , Rfl:  .  traZODone (DESYREL) 50 MG tablet, Take 50 mg by mouth at bedtime., Disp: , Rfl:  .  warfarin  (COUMADIN) 5 MG tablet, Take 1 tablet daily except 1 and 1/2 tabs on Monday, Wednesday, and Friday or as directed by Coumadin Clinic., Disp: 120 tablet, Rfl: 0 .  metoprolol succinate (TOPROL-XL) 25 MG 24 hr tablet, Take 0.5 tablets (12.5 mg total) by mouth daily., Disp: 90 tablet, Rfl: 3   Allergies  Allergen Reactions  . Levofloxacin Nausea Only  . Sulfa Antibiotics Other (See Comments)    Reaction:  Unknown      Objective: Vascular Examination: Capillary refill time immediate x 10 digits Dorsalis pedis pulses and Posterior tibial pulses 1/4 b/l No digital hair x 10 digits Skin temperature gradient WNL b/l  Dermatological Examination: Skin with normal turgor, texture and tone b/l  Venous stasis skin changes noted BLE  Toenails 1-5 b/l discolored, thick, dystrophic with subungual debris and pain with palpation to nailbeds due to thickness of nails.  Hyperkeratotic lesions noted dorsal PIPJ of left 4th, 5th digits and distal tips of b/l 2nd digits, submetatarsal heads 5 b/l.  There is no erythema, no edema, no drainage, no flocculence of either of these digits. There is tenderness to distal tips of bilateral 2nd digits.   Musculoskeletal: Muscle strength 5/5 to all LE muscle groups  Hammertoes 2-5 b/l  Neurological: Sensation intact with 10 gram monofilament. Vibratory sensation intact.  Assessment: 1. Painful  onychomycosis toenails 1-5 b/l in patient on blood thinner.  2. Calluses submet head 5 b/l 3. Corn formation b/l 2nd digit, left 4th, left 5th digits. 4. Long-term blood thinner  Plan: 1. Toenails 1-5 b/l were debrided in length and girth without iatrogenic bleeding. 2. Hyperkeratotic lesions debrided submet head 5 b/l,b/l 2nd digit, left 4th, left 5th digits utilizing sterile blade and gently filed with burr. Continue padding for lesser digit corns. 3. Patient to continue soft, supportive shoe gear 4. Patient to report any pedal injuries to medical professional  immediately. 5. Avoid self trimming due to use of blood thinner. 6. Follow up 3 months.  7. Patient/POA to call should there be a concern in the interim.

## 2018-12-21 ENCOUNTER — Ambulatory Visit: Payer: Medicare Other

## 2018-12-21 ENCOUNTER — Telehealth: Payer: Self-pay | Admitting: Student in an Organized Health Care Education/Training Program

## 2018-12-21 ENCOUNTER — Telehealth: Payer: Self-pay

## 2018-12-21 DIAGNOSIS — Z952 Presence of prosthetic heart valve: Secondary | ICD-10-CM

## 2018-12-21 DIAGNOSIS — I350 Nonrheumatic aortic (valve) stenosis: Secondary | ICD-10-CM | POA: Diagnosis not present

## 2018-12-21 DIAGNOSIS — Z7901 Long term (current) use of anticoagulants: Secondary | ICD-10-CM | POA: Diagnosis not present

## 2018-12-21 LAB — POCT INR: INR: 3.2 — AB (ref 2.0–3.0)

## 2018-12-21 MED ORDER — ENOXAPARIN SODIUM 100 MG/ML ~~LOC~~ SOLN: 100.0000 mg | Freq: Two times a day (BID) | SUBCUTANEOUS | 1 refills | Status: DC

## 2018-12-21 NOTE — Telephone Encounter (Signed)
Pt came into office to my window and asked if we had faxed over anything to his doctor for permission for pt to come off of cumadin for procedure next week? Pt states he just left that office and they said they haven't heard anything about this. Please call pt.

## 2018-12-21 NOTE — Telephone Encounter (Signed)
Patient advised that because he has recently had procedures here in which he had to stop the Coumadin, he should do so as he has for those past procedures. States he has already spoken with Dr. Windell Hummingbird office and ordered Lovenox. Instructed to stop Coumadin 5 days before procedure and take Lovenox bridge as directed by Dr. Mariah Milling.

## 2018-12-21 NOTE — Telephone Encounter (Addendum)
Pt presents to office today for INR check.   Pt reports that he is scheduled for "nerve ablation" of his knee w/ Dr. Cherylann Ratel on Monday, 2/3 and was advised he would need Lovenox bridge while holding coumadin. Pt has mechanical aortic valve and atrial flutter. I do not see that he has acquired cardiac clearance to proceed or to hold his coumadin. Advised pt that I will give him instructions on Lovenox bridge and send clearance request to Dr. Mariah Milling. If Dr. Mariah Milling sees any reason that pt cannot proceed w/ procedure, pt will receive a call from Dr. Windell Hummingbird nurse. Pt states that he is going upstairs to talk to Dr. Garnett Farm office and let them know that that should have sought cardiac clearance ahead of time. [name] last dose of coumadin will be tomorrow and he is scheduled to start Lovenox injections on Thursday, 1/30.  Spoke w/ Megan in pharmacy and she verified the need for Lovenox bridge.

## 2018-12-21 NOTE — Telephone Encounter (Signed)
Pt called back again and left a message asking to speak to a nurse again because he needs to tell her something very important.

## 2018-12-21 NOTE — Patient Instructions (Addendum)
Tuesday, 1/28: Last dose of Coumadin.  Wednesday, 1/29: No Coumadin or Lovenox.  Thursday, 1/30: Inject Lovenox 100 mg in the fatty abdominal tissue at least 2 inches from the belly button twice a day about 12 hours apart, 8am and 8pm rotate sites. No Coumadin.  Friday, 1/31: Inject Lovenox in the fatty tissue every 12 hours, 8am and 8pm. No Coumadin.  Saturday,2/1: Inject Lovenox in the fatty tissue every 12 hours, 8am and 8pm. No Coumadin.  Sunday, 2/2: Inject Lovenox in the fatty tissue in the morning at 8 am (No PM dose). No Coumadin.  Monday, 2/3: Procedure Day - No Lovenox - Resume Coumadin in the evening or as directed by doctor (take an extra half tablet with usual dose for 2 days then resume normal dose).  Tuesday, 2/4: Resume Lovenox inject in the fatty tissue every 12 hours and take Coumadin.  Wednesday, 2/5: Inject Lovenox in the fatty tissue every 12 hours and take Coumadin.  Thursday, 2/6: Inject Lovenox in the fatty tissue every 12 hours and take Coumadin.  Friday, 2/7: Inject Lovenox in the fatty tissue every 12 hours and take Coumadin.  Saturday, 2/8: Inject Lovenox in the fatty tissue every 12 hours and take Coumadin.  Sunday, 2/9: Inject Lovenox in the fatty tissue every 12 hours and take Coumadin.  Monday, 2/10: Coumadin appt to check INR.

## 2018-12-24 NOTE — Telephone Encounter (Signed)
Acceptable risk for procedure No further cardiac testing needed

## 2018-12-25 ENCOUNTER — Other Ambulatory Visit: Payer: Self-pay | Admitting: Cardiovascular Disease

## 2018-12-25 NOTE — Telephone Encounter (Signed)
Fax to Dr. Cherylann Ratel office, per Dr. Ethelene Hal note, pt acceptable risk for surgery. Message forwarded to pain clinic.   Please call with any further questions.

## 2018-12-28 ENCOUNTER — Ambulatory Visit (HOSPITAL_BASED_OUTPATIENT_CLINIC_OR_DEPARTMENT_OTHER): Payer: Medicare Other | Admitting: Student in an Organized Health Care Education/Training Program

## 2018-12-28 ENCOUNTER — Encounter: Payer: Self-pay | Admitting: Student in an Organized Health Care Education/Training Program

## 2018-12-28 ENCOUNTER — Ambulatory Visit
Admission: RE | Admit: 2018-12-28 | Discharge: 2018-12-28 | Disposition: A | Discharge status: Home / Self Care | Payer: Medicare Other | Source: Ambulatory Visit | Attending: Student in an Organized Health Care Education/Training Program | Admitting: Student in an Organized Health Care Education/Training Program

## 2018-12-28 ENCOUNTER — Other Ambulatory Visit: Payer: Self-pay

## 2018-12-28 DIAGNOSIS — M1712 Unilateral primary osteoarthritis, left knee: Secondary | ICD-10-CM | POA: Diagnosis present

## 2018-12-28 MED ORDER — ROPIVACAINE HCL 2 MG/ML IJ SOLN
INTRAMUSCULAR | Status: AC
  Filled 2018-12-28: qty 10

## 2018-12-28 MED ORDER — ROPIVACAINE HCL 2 MG/ML IJ SOLN
10.0000 mL | Freq: Once | INTRAMUSCULAR | Status: AC
  Administered 2018-12-28: 10 mL

## 2018-12-28 MED ORDER — FENTANYL CITRATE (PF) 100 MCG/2ML IJ SOLN
INTRAMUSCULAR | Status: AC
  Filled 2018-12-28: qty 2

## 2018-12-28 MED ORDER — DEXAMETHASONE SODIUM PHOSPHATE 10 MG/ML IJ SOLN
10.0000 mg | Freq: Once | INTRAMUSCULAR | Status: AC
  Administered 2018-12-28: 10 mg

## 2018-12-28 MED ORDER — LIDOCAINE HCL 2 % IJ SOLN
INTRAMUSCULAR | Status: AC
  Filled 2018-12-28: qty 20

## 2018-12-28 MED ORDER — LIDOCAINE HCL 2 % IJ SOLN
20.0000 mL | Freq: Once | INTRAMUSCULAR | Status: AC
  Administered 2018-12-28: 400 mg

## 2018-12-28 MED ORDER — FENTANYL CITRATE (PF) 100 MCG/2ML IJ SOLN
25.0000 ug | INTRAMUSCULAR | Status: DC | PRN
  Administered 2018-12-28: 37.5 ug via INTRAVENOUS

## 2018-12-28 MED ORDER — LACTATED RINGERS IV SOLN
1000.0000 mL | Freq: Once | INTRAVENOUS | Status: AC
  Administered 2018-12-28: 1000 mL via INTRAVENOUS

## 2018-12-28 MED ORDER — DEXAMETHASONE SODIUM PHOSPHATE 10 MG/ML IJ SOLN
INTRAMUSCULAR | Status: AC
  Filled 2018-12-28: qty 1

## 2018-12-28 NOTE — Patient Instructions (Signed)

## 2018-12-28 NOTE — Progress Notes (Deleted)
Patient's Name: [name]  MRN: [id number]  Referring Provider: Edward Jolly, MD  DOB: Jan 18, 1931  PCP: Gracelyn Nurse, MD  DOS: 12/28/2018  Note by: Edward Jolly, MD  Service setting: Ambulatory outpatient  Specialty: Interventional Pain Management  Patient type: Established  Location: [facility] (AMB) Pain Management Facility  Visit type: Interventional Procedure   Primary Reason for Visit: Interventional Pain Management Treatment. CC: Procedure (left Genicular RFA) and Knee Pain  Procedure:          Anesthesia, Analgesia, Anxiolysis:  Type: Therapeutic Superior-lateral, Superior-medial, and Inferior-medial, Genicular Nerve Radiofrequency Ablation.  #1  Region: Lateral, Anterior, and Medial aspects of the knee joint, above and below the knee joint proper. Level: Superior and inferior to the knee joint. Laterality: Left  Type: Moderate (Conscious) Sedation combined with Local Anesthesia Indication(s): Analgesia and Anxiety Route: Intravenous (IV) IV Access: Secured Sedation: Meaningful verbal contact was maintained at all times during the procedure  Local Anesthetic: Lidocaine 1-2%  Position: Supine   Indications: 1. Primary osteoarthritis of left knee    [name] has been dealing with the above chronic pain for longer than three months and has either failed to respond, was unable to tolerate, or simply did not get enough benefit from other more conservative therapies including, but not limited to: 1. Over-the-counter medications 2. Anti-inflammatory medications 3. Muscle relaxants 4. Membrane stabilizers 5. Opioids 6. Physical therapy and/or chiropractic manipulation 7. Modalities (Heat, ice, etc.) 8. Invasive techniques such as nerve blocks. [name] has attained more than 50% relief of the pain from a series of diagnostic injections conducted in separate occasions.  Pain Score: Pre-procedure: 5 /10 Post-procedure: 5 /10  Pre-op Assessment:  [name] is a 85  y.o. (year old), male patient, seen today for interventional treatment. He  has a past surgical history that includes Valve replacement (1/02); Cardiac catheterization; Joint replacement; Hernia repair; Esophagogastroduodenoscopy (N/A, 04/05/2015); Esophagogastroduodenoscopy (N/A, 04/17/2015); Esophagogastroduodenoscopy (N/A, 08/02/2015); and Total hip arthroplasty. [name] has a current medication list which includes the following prescription(s): cetirizine hcl, citalopram, clonidine, doxazosin, finasteride, furosemide, isosorbide mononitrate, metoprolol succinate, potassium chloride, ranitidine, ropinirole, tamsulosin, trazodone, enoxaparin, and warfarin. His primarily concern today is the Procedure (left Genicular RFA) and Knee Pain  Initial Vital Signs:  Pulse/HCG Rate: 68  Temp: 97.9 F (36.6 C) Resp: 18 BP: 136/71 SpO2: 99 %  BMI: Estimated body mass index is 29.29 kg/m as calculated from the following:   Height as of this encounter: 5\' 11"  (1.803 m).   Weight as of this encounter: 210 lb (95.3 kg).  Risk Assessment: Allergies: Reviewed. He is allergic to levofloxacin and sulfa antibiotics.  Allergy Precautions: None required Coagulopathies: Reviewed. None identified.  Blood-thinner therapy: None at this time Active Infection(s): Reviewed. None identified. [name] is afebrile  Site Confirmation: [name] was asked to confirm the procedure and laterality before marking the site Procedure checklist: Completed Consent: Before the procedure and under the influence of no sedative(s), amnesic(s), or anxiolytics, the patient was informed of the treatment options, risks and possible complications. To fulfill our ethical and legal obligations, as recommended by the American Medical Association's Code of Ethics, I have informed the patient of my clinical impression; the nature and purpose of the treatment or procedure; the risks, benefits, and possible complications of the intervention; the  alternatives, including doing nothing; the risk(s) and benefit(s) of the alternative treatment(s) or procedure(s); and the risk(s) and benefit(s) of doing nothing. The patient was provided information about the general risks  and possible complications associated with the procedure. These may include, but are not limited to: failure to achieve desired goals, infection, bleeding, organ or nerve damage, allergic reactions, paralysis, and death. In addition, the patient was informed of those risks and complications associated to the procedure, such as failure to decrease pain; infection; bleeding; organ or nerve damage with subsequent damage to sensory, motor, and/or autonomic systems, resulting in permanent pain, numbness, and/or weakness of one or several areas of the body; allergic reactions; (i.e.: anaphylactic reaction); and/or death. Furthermore, the patient was informed of those risks and complications associated with the medications. These include, but are not limited to: allergic reactions (i.e.: anaphylactic or anaphylactoid reaction(s)); adrenal axis suppression; blood sugar elevation that in diabetics may result in ketoacidosis or comma; water retention that in patients with history of congestive heart failure may result in shortness of breath, pulmonary edema, and decompensation with resultant heart failure; weight gain; swelling or edema; medication-induced neural toxicity; particulate matter embolism and blood vessel occlusion with resultant organ, and/or nervous system infarction; and/or aseptic necrosis of one or more joints. Finally, the patient was informed that Medicine is not an exact science; therefore, there is also the possibility of unforeseen or unpredictable risks and/or possible complications that may result in a catastrophic outcome. The patient indicated having understood very clearly. We have given the patient no guarantees and we have made no promises. Enough time was given to the  patient to ask questions, all of which were answered to the patient's satisfaction. [name] has indicated that he wanted to continue with the procedure. Attestation: I, the ordering provider, attest that I have discussed with the patient the benefits, risks, side-effects, alternatives, likelihood of achieving goals, and potential problems during recovery for the procedure that I have provided informed consent. Date  Time: {CHL [facility]-PAIN TIME CHOICES:[id number]}  Pre-Procedure Preparation:  Monitoring: As per clinic protocol. Respiration, ETCO2, SpO2, BP, heart rate and rhythm monitor placed and checked for adequate function Safety Precautions: Patient was assessed for positional comfort and pressure points before starting the procedure. Time-out: I initiated and conducted the "Time-out" before starting the procedure, as per protocol. The patient was asked to participate by confirming the accuracy of the "Time Out" information. Verification of the correct person, site, and procedure were performed and confirmed by me, the nursing staff, and the patient. "Time-out" conducted as per Joint Commission's Universal Protocol (UP.01.01.01). Time:    Description of Procedure:          Target Area: For Genicular Nerve block(s), the targets are: the superior-lateral genicular nerve, located in the lateral distal portion of the femoral shaft as it curves to form the lateral epicondyle, in the region of the distal femoral metaphysis; the superior-medial genicular nerve, located in the medial distal portion of the femoral shaft as it curves to form the medial epicondyle; and the inferior-medial genicular nerve, located in the medial, proximal portion of the tibial shaft, as it curves to form the medial epicondyle, in the region of the proximal tibial metaphysis. Approach: Anterior, ipsilateral approach. Area Prepped: Entire knee area, from mid-thigh to mid-shin, lateral, anterior, and medial aspects. Prepping  solution: Hibiclens (4.0% Chlorhexidine gluconate solution) Safety Precautions: Aspiration looking for blood return was conducted prior to all injections. At no point did we inject any substances, as a needle was being advanced. No attempts were made at seeking any paresthesias. Safe injection practices and needle disposal techniques used. Medications properly checked for expiration dates. SDV (single dose vial) medications  used. Description of the Procedure: Protocol guidelines were followed. The patient was placed in position over the procedure table. The target area was identified and the area prepped in the usual manner. The skin and muscle were infiltrated with local anesthetic. Appropriate amount of time allowed to pass for local anesthetics to take effect. Radiofrequency needles were introduced to the target area using fluoroscopic guidance. Using the NeuroTherm NT1100 Radiofrequency Generator, sensory stimulation using 50 Hz was used to locate & identify the nerve, making sure that the needle was positioned such that there was no sensory stimulation below 0.3 V or above 0.7 V. Stimulation using 2 Hz was used to evaluate the motor component. Care was taken not to lesion any nerves that demonstrated motor stimulation of the lower extremities at an output of less than 2.5 times that of the sensory threshold, or a maximum of 2.0 V. Once satisfactory placement of the needles was achieved, the numbing solution was slowly injected after negative aspiration. After waiting for at least 2 minutes, the ablation was performed at 80 degrees C for 60 seconds, using regular Radiofrequency settings. Once the procedure was completed, the needles were then removed and the area cleansed, making sure to leave some of the prepping solution back to take advantage of its long term bactericidal properties. Intra-operative Compliance: Compliant Vitals:   12/28/18 1152  BP: 136/71  Pulse: 68  Resp: 18  Temp: 97.9 F (36.6  C)  SpO2: 99%  Weight: 210 lb (95.3 kg)  Height: 5\' 11"  (1.803 m)    Start Time:   hrs. End Time:   hrs. Materials & Medications:  Needle(s) Type: Teflon-coated, curved tip, Radiofrequency needle(s) Gauge: 22G Length: 10cm Medication(s): Please see orders for medications and dosing details.  Imaging Guidance (Non-Spinal):          Type of Imaging Technique: Fluoroscopy Guidance (Non-Spinal) Indication(s): Assistance in needle guidance and placement for procedures requiring needle placement in or near specific anatomical locations not easily accessible without such assistance. Exposure Time: Please see nurses notes. Contrast: Before injecting any contrast, we confirmed that the patient did not have an allergy to iodine, shellfish, or radiological contrast. Once satisfactory needle placement was completed at the desired level, radiological contrast was injected. Contrast injected under live fluoroscopy. No contrast complications. See chart for type and volume of contrast used. Fluoroscopic Guidance: I was personally present during the use of fluoroscopy. "Tunnel Vision Technique" used to obtain the best possible view of the target area. Parallax error corrected before commencing the procedure. "Direction-depth-direction" technique used to introduce the needle under continuous pulsed fluoroscopy. Once target was reached, antero-posterior, oblique, and lateral fluoroscopic projection used confirm needle placement in all planes. Images permanently stored in EMR. Interpretation: I personally interpreted the imaging intraoperatively. Adequate needle placement confirmed in multiple planes. Appropriate spread of contrast into desired area was observed. No evidence of afferent or efferent intravascular uptake. Permanent images saved into the patient's record.  Antibiotic Prophylaxis:   Anti-infectives (From admission, onward)   None     Indication(s): None identified  Post-operative Assessment:   Post-procedure Vital Signs:  Pulse/HCG Rate: 68  Temp: 97.9 F (36.6 C) Resp: 18 BP: 136/71 SpO2: 99 %  EBL: None  Complications: No immediate post-treatment complications observed by team, or reported by patient.  Note: The patient tolerated the entire procedure well. A repeat set of vitals were taken after the procedure and the patient was kept under observation following institutional policy, for this type of procedure. Post-procedural neurological  assessment was performed, showing return to baseline, prior to discharge. The patient was provided with post-procedure discharge instructions, including a section on how to identify potential problems. Should any problems arise concerning this procedure, the patient was given instructions to immediately contact us, at any time, without hesitation. In any case, we plan to contact the patient by telephone for a follow-up status report regarding this interventional procedure.  Comments:  No additional relevant information.  Plan of Care   Imaging Orders  No imaging studies ordered today   Procedure Orders    No procedure(s) ordered today    Medications ordered for procedure: No orders of the defined types were placed in this encounter.  Medications administered: Nehemias AGenevie Ann. Swiech "Syd" had no medications administered during this visit.  See the medical record for exact dosing, route, and time of administration.  Disposition: Discharge home  Discharge Date & Time: 12/28/2018;   hrs.   Physician-requested Follow-up: No follow-ups on file.  Future Appointments  Date Time Provider Department Center  01/04/2019 10:00 AM [facility]  02/03/2019  2:15 PM George HughVanschaick, Jessica, NP [facility] None  02/08/2019 10:40 AM Antonieta IbaGollan, Timothy J, MD [facility]  02/10/2019  1:30 PM Freddie BreechGalaway, Jennifer L, DPM [facility]   Primary Care Physician: Gracelyn NurseJohnston, [name], MD [facility]  Facility Note by: Edward JollyBilal Tavarion Babington, MD Date: 12/28/2018; Time: 12:26 PM  Disclaimer:  Medicine is not an exact science. The only guarantee in medicine is that nothing is guaranteed. It is important to note that the decision to proceed with this intervention was based on the information collected from the patient. The Data and conclusions were drawn from the patient's questionnaire, the interview, and the physical examination. Because the information was provided in large part by the patient, it cannot be guaranteed that it has not been purposely or unconsciously manipulated. Every effort has been made to obtain as much relevant data as possible for this evaluation. It is important to note that the conclusions that lead to this procedure are derived in large part from the available data. Always take into account that the treatment will also be dependent on availability of resources and existing treatment guidelines, considered by other Pain Management Practitioners as being common knowledge and practice, at the time of the intervention. For Medico-Legal purposes, it is also important to point out that variation in procedural techniques and pharmacological choices are the acceptable norm. The indications, contraindications, technique, and results of the above procedure should only be interpreted and judged by a Board-Certified Interventional Pain Specialist with extensive familiarity and expertise in the same exact procedure and technique.

## 2018-12-28 NOTE — Progress Notes (Signed)
His wife patient's Name: [name]  MRN: [id number]  Referring Provider: Edward JollyLateef, Rheba Diamond, MD  DOB: September 21, 1931  PCP: Gracelyn NurseJohnston, [name], MD  DOS: 12/28/2018  Note by: Edward JollyBilal Taletha Twiford, MD  Service setting: Ambulatory outpatient  Specialty: Interventional Pain Management  Patient type: Established  Location: [facility] (AMB) Pain Management Facility  Visit type: Interventional Procedure   Primary Reason for Visit: Interventional Pain Management Treatment. CC: Procedure (left Genicular RFA) and Knee Pain  Procedure:          Anesthesia, Analgesia, Anxiolysis:  Type: Therapeutic Superior-lateral, Superior-medial, and Inferior-medial, Genicular Nerve Radiofrequency Ablation.  #1  Region: Lateral, Anterior, and Medial aspects of the knee joint, above and below the knee joint proper. Level: Superior and inferior to the knee joint. Laterality: Left  Type: Moderate (Conscious) Sedation combined with Local Anesthesia Indication(s): Analgesia and Anxiety Route: Intravenous (IV) IV Access: Secured Sedation: Meaningful verbal contact was maintained at all times during the procedure  Local Anesthetic: Lidocaine 1-2%  Position: Supine   Indications: 1. Primary osteoarthritis of left knee    [name] has been dealing with the above chronic pain for longer than three months and has either failed to respond, was unable to tolerate, or simply did not get enough benefit from other more conservative therapies including, but not limited to: 1. Over-the-counter medications 2. Anti-inflammatory medications 3. Muscle relaxants 4. Membrane stabilizers 5. Opioids 6. Physical therapy and/or chiropractic manipulation 7. Modalities (Heat, ice, etc.) 8. Invasive techniques such as nerve blocks. [name] has attained more than 50% relief of the pain from a series of diagnostic injections conducted in separate occasions.  Pain Score: Pre-procedure: 5 /10 Post-procedure: 0-No pain/10  Patient has discontinued  his Coumadin and is currently on Lovenox bridge.  Last dose of Lovenox was yesterday.  Will restart Lovenox tomorrow and has instructions from physician who is managing his Coumadin of how to transition back to Coumadin via Lovenox bridge.  Pre-op Assessment:  [name] is a 85 y.o. (year old), male patient, seen today for interventional treatment. He  has a past surgical history that includes Valve replacement (1/02); Cardiac catheterization; Joint replacement; Hernia repair; Esophagogastroduodenoscopy (N/A, 04/05/2015); Esophagogastroduodenoscopy (N/A, 04/17/2015); Esophagogastroduodenoscopy (N/A, 08/02/2015); and Total hip arthroplasty. [name] has a current medication list which includes the following prescription(s): cetirizine hcl, citalopram, clonidine, doxazosin, finasteride, furosemide, isosorbide mononitrate, metoprolol succinate, potassium chloride, ranitidine, ropinirole, tamsulosin, trazodone, enoxaparin, and warfarin, and the following Facility-Administered Medications: fentanyl. His primarily concern today is the Procedure (left Genicular RFA) and Knee Pain  Initial Vital Signs:  Pulse/HCG Rate: 68ECG Heart Rate: (!) 52 Temp: 97.9 F (36.6 C) Resp: 18 BP: 136/71 SpO2: 99 %  BMI: Estimated body mass index is 29.29 kg/m as calculated from the following:   Height as of this encounter: 5\' 11"  (1.803 m).   Weight as of this encounter: 210 lb (95.3 kg).  Risk Assessment: Allergies: Reviewed. He is allergic to levofloxacin and sulfa antibiotics.  Allergy Precautions: None required Coagulopathies: Reviewed. None identified.  Blood-thinner therapy: None at this time Active Infection(s): Reviewed. None identified. [name] is afebrile  Site Confirmation: [name] was asked to confirm the procedure and laterality before marking the site Procedure checklist: Completed Consent: Before the procedure and under the influence of no sedative(s), amnesic(s), or anxiolytics, the patient was  informed of the treatment options, risks and possible complications. To fulfill our ethical and legal obligations, as recommended by the American Medical Association's Code of Ethics, I have informed the  patient of my clinical impression; the nature and purpose of the treatment or procedure; the risks, benefits, and possible complications of the intervention; the alternatives, including doing nothing; the risk(s) and benefit(s) of the alternative treatment(s) or procedure(s); and the risk(s) and benefit(s) of doing nothing. The patient was provided information about the general risks and possible complications associated with the procedure. These may include, but are not limited to: failure to achieve desired goals, infection, bleeding, organ or nerve damage, allergic reactions, paralysis, and death. In addition, the patient was informed of those risks and complications associated to the procedure, such as failure to decrease pain; infection; bleeding; organ or nerve damage with subsequent damage to sensory, motor, and/or autonomic systems, resulting in permanent pain, numbness, and/or weakness of one or several areas of the body; allergic reactions; (i.e.: anaphylactic reaction); and/or death. Furthermore, the patient was informed of those risks and complications associated with the medications. These include, but are not limited to: allergic reactions (i.e.: anaphylactic or anaphylactoid reaction(s)); adrenal axis suppression; blood sugar elevation that in diabetics may result in ketoacidosis or comma; water retention that in patients with history of congestive heart failure may result in shortness of breath, pulmonary edema, and decompensation with resultant heart failure; weight gain; swelling or edema; medication-induced neural toxicity; particulate matter embolism and blood vessel occlusion with resultant organ, and/or nervous system infarction; and/or aseptic necrosis of one or more joints. Finally, the  patient was informed that Medicine is not an exact science; therefore, there is also the possibility of unforeseen or unpredictable risks and/or possible complications that may result in a catastrophic outcome. The patient indicated having understood very clearly. We have given the patient no guarantees and we have made no promises. Enough time was given to the patient to ask questions, all of which were answered to the patient's satisfaction. [name] has indicated that he wanted to continue with the procedure. Attestation: I, the ordering provider, attest that I have discussed with the patient the benefits, risks, side-effects, alternatives, likelihood of achieving goals, and potential problems during recovery for the procedure that I have provided informed consent. Date  Time: 12/28/2018 11:38 AM  Pre-Procedure Preparation:  Monitoring: As per clinic protocol. Respiration, ETCO2, SpO2, BP, heart rate and rhythm monitor placed and checked for adequate function Safety Precautions: Patient was assessed for positional comfort and pressure points before starting the procedure. Time-out: I initiated and conducted the "Time-out" before starting the procedure, as per protocol. The patient was asked to participate by confirming the accuracy of the "Time Out" information. Verification of the correct person, site, and procedure were performed and confirmed by me, the nursing staff, and the patient. "Time-out" conducted as per Joint Commission's Universal Protocol (UP.01.01.01). Time: 1303  Description of Procedure:          Target Area: For Genicular Nerve block(s), the targets are: the superior-lateral genicular nerve, located in the lateral distal portion of the femoral shaft as it curves to form the lateral epicondyle, in the region of the distal femoral metaphysis; the superior-medial genicular nerve, located in the medial distal portion of the femoral shaft as it curves to form the medial epicondyle; and  the inferior-medial genicular nerve, located in the medial, proximal portion of the tibial shaft, as it curves to form the medial epicondyle, in the region of the proximal tibial metaphysis. Approach: Anterior, ipsilateral approach. Area Prepped: Entire knee area, from mid-thigh to mid-shin, lateral, anterior, and medial aspects. Prepping solution: Hibiclens (4.0% Chlorhexidine gluconate solution) Safety  Precautions: Aspiration looking for blood return was conducted prior to all injections. At no point did we inject any substances, as a needle was being advanced. No attempts were made at seeking any paresthesias. Safe injection practices and needle disposal techniques used. Medications properly checked for expiration dates. SDV (single dose vial) medications used. Description of the Procedure: Protocol guidelines were followed. The patient was placed in position over the procedure table. The target area was identified and the area prepped in the usual manner. The skin and muscle were infiltrated with local anesthetic. Appropriate amount of time allowed to pass for local anesthetics to take effect. Radiofrequency needles were introduced to the target area using fluoroscopic guidance. Using the NeuroTherm NT1100 Radiofrequency Generator, sensory stimulation using 50 Hz was used to locate & identify the nerve, making sure that the needle was positioned such that there was no sensory stimulation below 0.3 V or above 0.7 V. Stimulation using 2 Hz was used to evaluate the motor component. Care was taken not to lesion any nerves that demonstrated motor stimulation of the lower extremities at an output of less than 2.5 times that of the sensory threshold, or a maximum of 2.0 V. Once satisfactory placement of the needles was achieved, the numbing solution was slowly injected after negative aspiration. After waiting for at least 2 minutes, the ablation was performed at 80 degrees C for 60 seconds, using regular  Radiofrequency settings. Once the procedure was completed, the needles were then removed and the area cleansed, making sure to leave some of the prepping solution back to take advantage of its long term bactericidal properties. Intra-operative Compliance: Compliant Vitals:   12/28/18 1333 12/28/18 1342 12/28/18 1353 12/28/18 1402  BP: 123/72 (!) 184/84 (!) 148/97 (!) 184/77  Pulse:      Resp: 16 18 17 16   Temp:  97.6 F (36.4 C)  97.8 F (36.6 C)  SpO2: 96% 100% 100% 100%  Weight:      Height:        Start Time: 1303 hrs. End Time: 1331 hrs. Materials & Medications:  Needle(s) Type: Teflon-coated, curved tip, Radiofrequency needle(s) Gauge: 22G Length: 10cm Medication(s): Please see orders for medications and dosing details. 5 cc solution made of 4 cc of 0.2% ropivacaine, 1 cc of Decadron 10 mg/cc.  1.5 cc injected at each level prior to lesioning. Imaging Guidance (Non-Spinal):          Type of Imaging Technique: Fluoroscopy Guidance (Non-Spinal) Indication(s): Assistance in needle guidance and placement for procedures requiring needle placement in or near specific anatomical locations not easily accessible without such assistance. Exposure Time: Please see nurses notes. Contrast: Before injecting any contrast, we confirmed that the patient did not have an allergy to iodine, shellfish, or radiological contrast. Once satisfactory needle placement was completed at the desired level, radiological contrast was injected. Contrast injected under live fluoroscopy. No contrast complications. See chart for type and volume of contrast used. Fluoroscopic Guidance: I was personally present during the use of fluoroscopy. "Tunnel Vision Technique" used to obtain the best possible view of the target area. Parallax error corrected before commencing the procedure. "Direction-depth-direction" technique used to introduce the needle under continuous pulsed fluoroscopy. Once target was reached,  antero-posterior, oblique, and lateral fluoroscopic projection used confirm needle placement in all planes. Images permanently stored in EMR. Interpretation: I personally interpreted the imaging intraoperatively. Adequate needle placement confirmed in multiple planes. Appropriate spread of contrast into desired area was observed. No evidence of afferent or efferent intravascular  uptake. Permanent images saved into the patient's record.  Antibiotic Prophylaxis:   Anti-infectives (From admission, onward)   None     Indication(s): None identified  Post-operative Assessment:  Post-procedure Vital Signs:  Pulse/HCG Rate: 68(!) 53 Temp: 97.8 F (36.6 C) Resp: 16 BP: (!) 184/77 SpO2: 100 %  EBL: None  Complications: No immediate post-treatment complications observed by team, or reported by patient.  Note: The patient tolerated the entire procedure well. A repeat set of vitals were taken after the procedure and the patient was kept under observation following institutional policy, for this type of procedure. Post-procedural neurological assessment was performed, showing return to baseline, prior to discharge. The patient was provided with post-procedure discharge instructions, including a section on how to identify potential problems. Should any problems arise concerning this procedure, the patient was given instructions to immediately contact us, at any time, without hesitation. In any case, we plan to contact the patient by telephone for a follow-up status report regarding this interventional procedure.  Comments:  No additional relevant information.  Plan of Care   Imaging Orders     DG C-Arm 1-60 Min-No Report Procedure Orders    No procedure(s) ordered today    Patient to restart Lovenox tomorrow.  Has instructions of how to transition to Coumadin via Lovenox bridge.  Medications ordered for procedure: Meds ordered this encounter  Medications  . lactated ringers infusion 1,000  mL  . fentaNYL (SUBLIMAZE) injection 25-100 mcg    Make sure Narcan is available in the pyxis when using this medication. In the event of respiratory depression (RR< 8/min): Titrate NARCAN (naloxone) in increments of 0.1 to 0.2 mg IV at 2-3 minute intervals, until desired degree of reversal.  . ropivacaine (PF) 2 mg/mL (0.2%) (NAROPIN) injection 10 mL  . lidocaine (XYLOCAINE) 2 % (with pres) injection 400 mg  . dexamethasone (DECADRON) injection 10 mg   Medications administered: We administered lactated ringers, fentaNYL, ropivacaine (PF) 2 mg/mL (0.2%), lidocaine, and dexamethasone.  See the medical record for exact dosing, route, and time of administration.  Disposition: Discharge home  Discharge Date & Time: 12/28/2018; 1402 hrs.    Future Appointments  Date Time Provider Department Center  01/04/2019 10:00 AM [facility]  02/03/2019  2:15 PM George Hugh, NP [facility] None  02/08/2019 10:40 AM Antonieta Iba, MD [facility]  02/09/2019 11:30 AM Edward Jolly, MD [facility] None  02/10/2019  1:30 PM Freddie Breech, DPM [facility]   Primary Care Physician: Gracelyn Nurse, MD Location: [facility] Note by: Edward Jolly, MD Date: 12/28/2018; Time: 3:51 PM  Disclaimer:  Medicine is not an exact science. The only guarantee in medicine is that nothing is guaranteed. It is important to note that the decision to proceed with this intervention was based on the information collected from the patient. The Data and conclusions were drawn from the patient's questionnaire, the interview, and the physical examination. Because the information was provided in large part by the patient, it cannot be guaranteed that it has not been purposely or unconsciously manipulated. Every effort has been made to obtain as much relevant data as possible for this evaluation. It is important to note that the conclusions that lead to  this procedure are derived in large part from the available data. Always take into account that the treatment will also be dependent on availability of resources and existing treatment guidelines, considered by other Pain Management Practitioners as being common knowledge and practice,  at the time of the intervention. For Medico-Legal purposes, it is also important to point out that variation in procedural techniques and pharmacological choices are the acceptable norm. The indications, contraindications, technique, and results of the above procedure should only be interpreted and judged by a Board-Certified Interventional Pain Specialist with extensive familiarity and expertise in the same exact procedure and technique.

## 2018-12-28 NOTE — Progress Notes (Signed)
Safety precautions to be maintained throughout the outpatient stay will include: orient to surroundings, keep bed in low position, maintain call bell within reach at all times, provide assistance with transfer out of bed and ambulation.  

## 2018-12-29 ENCOUNTER — Telehealth: Payer: Self-pay | Admitting: *Deleted

## 2018-12-29 NOTE — Telephone Encounter (Signed)
Denies complications post procedure. 

## 2019-01-04 ENCOUNTER — Ambulatory Visit: Payer: Medicare Other

## 2019-01-04 DIAGNOSIS — Z952 Presence of prosthetic heart valve: Secondary | ICD-10-CM | POA: Diagnosis not present

## 2019-01-04 DIAGNOSIS — Z7901 Long term (current) use of anticoagulants: Secondary | ICD-10-CM | POA: Diagnosis not present

## 2019-01-04 DIAGNOSIS — I350 Nonrheumatic aortic (valve) stenosis: Secondary | ICD-10-CM

## 2019-01-04 LAB — POCT INR: INR: 1.7 — AB (ref 2.0–3.0)

## 2019-01-04 NOTE — Patient Instructions (Signed)
Please take 2 tablets tonight, 1.5 tablet tomorrow, then resume dosage of 1 tablet every day except 1.5 tablets on Mondays, Wednesdays & Fridays.  Please take your Lovenox injection tonight, then STOP LOVENOX. Recheck in 1 week.

## 2019-01-11 ENCOUNTER — Ambulatory Visit: Payer: Medicare Other

## 2019-01-11 DIAGNOSIS — I350 Nonrheumatic aortic (valve) stenosis: Secondary | ICD-10-CM

## 2019-01-11 DIAGNOSIS — Z7901 Long term (current) use of anticoagulants: Secondary | ICD-10-CM

## 2019-01-11 DIAGNOSIS — Z952 Presence of prosthetic heart valve: Secondary | ICD-10-CM

## 2019-01-11 LAB — POCT INR: INR: 2.1 (ref 2.0–3.0)

## 2019-01-11 NOTE — Patient Instructions (Signed)
Please take 2 tablets tonight, then resume dosage of 1 tablet every day except 1.5 tablets on Mondays, Wednesdays & Fridays.  Recheck in 3 weeks.

## 2019-02-03 ENCOUNTER — Ambulatory Visit: Payer: Medicare Other | Admitting: Adult Health

## 2019-02-05 ENCOUNTER — Other Ambulatory Visit: Payer: Self-pay

## 2019-02-05 MED ORDER — ISOSORBIDE MONONITRATE ER 60 MG PO TB24: 60.0000 mg | ORAL_TABLET | Freq: Two times a day (BID) | ORAL | 3 refills | Status: DC

## 2019-02-07 ENCOUNTER — Other Ambulatory Visit: Payer: Self-pay | Admitting: Cardiovascular Disease

## 2019-02-08 ENCOUNTER — Other Ambulatory Visit: Payer: Self-pay

## 2019-02-08 ENCOUNTER — Ambulatory Visit (INDEPENDENT_AMBULATORY_CARE_PROVIDER_SITE_OTHER): Payer: Medicare Other

## 2019-02-08 ENCOUNTER — Ambulatory Visit: Payer: Medicare Other | Admitting: Cardiovascular Disease

## 2019-02-08 ENCOUNTER — Encounter: Payer: Self-pay | Admitting: Cardiovascular Disease

## 2019-02-08 ENCOUNTER — Telehealth: Payer: Self-pay

## 2019-02-08 VITALS — BP 148/70 | HR 57 | Ht 71.0 in | Wt 220.0 lb

## 2019-02-08 DIAGNOSIS — Z952 Presence of prosthetic heart valve: Secondary | ICD-10-CM

## 2019-02-08 DIAGNOSIS — I10 Essential (primary) hypertension: Secondary | ICD-10-CM | POA: Diagnosis not present

## 2019-02-08 DIAGNOSIS — I350 Nonrheumatic aortic (valve) stenosis: Secondary | ICD-10-CM

## 2019-02-08 DIAGNOSIS — Z7901 Long term (current) use of anticoagulants: Secondary | ICD-10-CM | POA: Diagnosis not present

## 2019-02-08 LAB — POCT INR: INR: 3 (ref 2.0–3.0)

## 2019-02-08 MED ORDER — ISOSORBIDE MONONITRATE ER 60 MG PO TB24: 60.0000 mg | ORAL_TABLET | Freq: Two times a day (BID) | ORAL | 0 refills | Status: DC

## 2019-02-08 MED ORDER — TORSEMIDE 20 MG PO TABS: 40.0000 mg | ORAL_TABLET | Freq: Every day | ORAL | 3 refills | Status: DC

## 2019-02-08 MED ORDER — POTASSIUM CHLORIDE ER 10 MEQ PO TBCR: 20.0000 meq | EXTENDED_RELEASE_TABLET | Freq: Every day | ORAL | 3 refills | Status: DC

## 2019-02-08 NOTE — Patient Instructions (Signed)
Please continue dosage of 1 tablet every day except 1.5 tablets on Mondays, Wednesdays & Fridays.  Recheck in 4 weeks.

## 2019-02-08 NOTE — Progress Notes (Signed)
Cardiology Office Note  Date:  02/08/2019   ID:  [name], DOB [birth year], MRN [id number]  PCP:  Gracelyn Nurse, MD   Chief Complaint  Patient presents with  . Other    3 month follow up. Patient c/o some SOB.  Meds reviewed verbally with patient.     HPI:  [name] is a 85 y.o. male with  h/o aortic stenosis  s/p [employer] mechanical aortic valve replacement in 2002 on chronic Coumadin therapy,  mild CAD by cardiac cath in 2011 managed medically,  GERD,  HTN,  carotid bruits,  Chronic hyponatremia asymptomatic bradycardia,  RLS, anxiety, depression,  GI bleed OSA on CPAP osteoarthritis s/p total left hip replacement on 03/21/2015 who was discharged from [facility] the above surgery with a hgb of 8 (baseline 12), he was started on po iron replacement therapy, who presented to on 04/02/15 with acute onset of weakness, melena, BRBPR, and was found to have a hgb of 5.1. Aspirin and warfarin initially held, restarted at a later date, S/p EGD Ejection fraction greater than 55% March 2018, moderately elevated right heart pressures He presents today for follow-up of his aortic valve replacement and hypertension  INTERVAL HISTORY: The patient reports today for follow up. Ambulating with a walker. Reports the Lasix has not been working. He has been having a lot of leg swelling. He's had the leg swelling for several weeks. Denies excessively drinking fluids. He has compression socks, but sometimes he can not put them on due to his legs being too swollen.  Does not eat out frequently, denies high salt intake  He weighs himself everyday and has noticed that his weight his gone up about 10 lbs.  Review of our records details 9 pound weight gain since prior office visit October 2019  He has been avoiding large crowds of people.   He sleeps with legs up.  He does spend lots of time in his recliner  Blood pressure 148/70 Total Chol 164/ LDL 98 CR 1.1 Glucose 96  EKG  personally reviewed by myself on todays visit Shows atrial fibrillation rhythm with slow ventricular response. 57 bpm. Inferior infarct, age undetermined. Abnormal ECG.    OTHER PAST MEDICAL HISTORY REVIEWED BY ME FOR TODAY'S VISIT: Prior echocardiogram April 2018 Previous Echocardiogram shows normal LV function, well-functioning aortic valve, Moderately elevated right heart pressures with severe TR  Was in the ED 08/16/17 due to HTN. Patient was treated and released.  Blood pressure was 215/100  Recently seen in the emergency room Twice for hypertension On a prior office visit I recommended he increase isosorbide to 30 mg twice a day Also was recommended to double his Diovan dose He was given 1 dose clonidine in the emergency room On a follow-up visit with ryan Dunn it was recommended he increase isosorbide to 30 mg 3 times a day, increase metoprolol up to 50 mg daily He showed up and began in the emergency room several days later  Not on hydralazine secondary to side effects Clonidine was fatigue dry mouth Prior notes indicating amlodipine 10 mg caused leg swelling,  Though he reports stopping the amlodipine did not change his swelling  48 hour Holter monitor 12/2015 Normal sinus rhythm Average heart beat 70 bpm, maximum heart rate 114 bpm, minimum 45 bpm (at 3 AM) Frequent APCs, couplets, bigeminy's with short runs, longest run of 5 beats (12% of all beats)  Previous cardiac catheterization report from 2011 reviewed with him showing 20% proximal LAD, 40%  mid LAD disease  PMH:   has a past medical history of Anxiety (10/11), Aortic stenosis, Bradycardia, C. difficile colitis, Carotid bruit, Chronic diastolic CHF (congestive heart failure) (HCC), Coronary artery disease, Decreased hearing, Depression, Gastric ulcer, GERD (gastroesophageal reflux disease), Hypertension, Mod-Sev Tricuspid regurgitation, RLS (restless legs syndrome) (08/23/2015), S/P AVR, and SOB (shortness of breath)  (10/11).  PSH:    Past Surgical History:  Procedure Laterality Date  . CARDIAC CATHETERIZATION    . ESOPHAGOGASTRODUODENOSCOPY N/A 04/05/2015   Procedure: ESOPHAGOGASTRODUODENOSCOPY (EGD);  Surgeon: Scot Jun, MD;  Location: [facility] ENDOSCOPY;  Service: Endoscopy;  Laterality: N/A;  . ESOPHAGOGASTRODUODENOSCOPY N/A 04/17/2015   Procedure: ESOPHAGOGASTRODUODENOSCOPY (EGD);  Surgeon: Scot Jun, MD;  Location: [facility] ENDOSCOPY;  Service: Endoscopy;  Laterality: N/A;  . ESOPHAGOGASTRODUODENOSCOPY N/A 08/02/2015   Procedure: ESOPHAGOGASTRODUODENOSCOPY (EGD);  Surgeon: Wallace Cullens, MD;  Location: [facility] ENDOSCOPY;  Service: Endoscopy;  Laterality: N/A;  . HERNIA REPAIR    . JOINT REPLACEMENT    . TOTAL HIP ARTHROPLASTY    . VALVE REPLACEMENT  1/02   Aortic; echo 3/09 valve working well; echo 10/11 working well; put on Coumadin    Current Outpatient Medications on File Prior to Visit  Medication Sig Dispense Refill  . Cetirizine HCl 10 MG CAPS Take 10 mg by mouth daily.    . citalopram (CELEXA) 20 MG tablet Take 20 mg by mouth daily.    . cloNIDine (CATAPRES) 0.1 MG tablet Take 1 tablet (0.1 mg total) by mouth 2 (two) times daily. 180 tablet 3  . doxazosin (CARDURA) 8 MG tablet TAKE 1 TABLET BY MOUTH  TWICE A DAY 180 tablet 0  . enoxaparin (LOVENOX) 100 MG/ML injection Inject 1 mL (100 mg total) into the skin every 12 (twelve) hours. 20 Syringe 1  . finasteride (PROSCAR) 5 MG tablet Take 5 mg by mouth daily.      . furosemide (LASIX) 40 MG tablet Take 1 tablet (40 mg total) by mouth daily. 90 tablet 3  . isosorbide mononitrate (IMDUR) 60 MG 24 hr tablet Take 1 tablet (60 mg total) by mouth 2 (two) times daily. 180 tablet 3  . metoprolol succinate (TOPROL-XL) 25 MG 24 hr tablet Take 0.5 tablets (12.5 mg total) by mouth daily. 90 tablet 3  . potassium chloride (K-DUR) 10 MEQ tablet Take 1 tablet (10 mEq total) by mouth daily. 90 tablet 3  . ranitidine (ZANTAC) 150 MG tablet Take 150 mg by  mouth as needed.    Marland Kitchen rOPINIRole (REQUIP) 2 MG tablet TAKE 1 TABLET BY MOUTH 4  TIMES DAILY 360 tablet 3  . Tamsulosin HCl (FLOMAX) 0.4 MG CAPS Take 0.4 mg by mouth at bedtime.     . traZODone (DESYREL) 50 MG tablet Take 50 mg by mouth at bedtime.    Marland Kitchen warfarin (COUMADIN) 5 MG tablet Take 1 tablet daily except 1 and 1/2 tabs on Monday, Wednesday, and Friday or as directed by [facility]. 120 tablet 0   No current facility-administered medications on file prior to visit.     Allergies:   Levofloxacin and Sulfa antibiotics   Social History:  The patient  reports that he quit smoking about 41 years ago. His smoking use included cigarettes. He has never used smokeless tobacco. He reports current alcohol use of about 3.0 standard drinks of alcohol per week. He reports that he does not use drugs.   Family History:   family history includes Diabetes type II in his mother; Heart attack in his  mother; Heart disease in his father and mother; Hypertension in his father, mother, and another family member; Stroke in his brother, brother, and father.    Review of Systems: Review of Systems  Constitutional: Negative.   Eyes: Negative.   Respiratory: Negative.   Cardiovascular: Positive for leg swelling.  Gastrointestinal: Negative.   Genitourinary: Negative.   Musculoskeletal: Negative.   Neurological: Negative.   Psychiatric/Behavioral: Negative.   All other systems reviewed and are negative.    PHYSICAL EXAM: VS:  BP (!) 148/70 (BP Location: Left Arm, Patient Position: Sitting, Cuff Size: Normal)   Pulse (!) 57   Ht  (1.803 m)   Wt 220 lb (99.8 kg)   BMI 30.68 kg/m  , BMI Body mass index is 30.68 kg/m.  Presents with a walker today. Constitutional:  oriented to person, place, and time. No distress.  HENT: normal Head: Grossly normal Eyes:  no discharge. No scleral icterus.  Neck: No JVD, no carotid bruits  Cardiovascular: Regular rate and rhythm, no murmurs appreciated   Severe 2+ edema bilaterally lower extremities. Pulmonary/Chest: Clear to auscultation bilaterally, no wheezes or rales Abdominal: Soft.  no distension.  no tenderness.  Musculoskeletal: Normal range of motion Neurological:  normal muscle tone. Coordination normal. No atrophy Skin: Skin warm and dry Psychiatric: normal affect, pleasant   Recent Labs: 10/27/2018: B Natriuretic Peptide 467.0 11/24/2018: BUN 16; Creatinine, Ser 0.97; Hemoglobin 12.3; Platelets 145; Potassium 4.0; Sodium 131    Lipid Panel No results found for: CHOL, HDL, LDLCALC, TRIG    Wt Readings from Last 3 Encounters:  12/09/16 200 lb (90.7 kg)  11/08/16 183 lb (83 kg) (wrong weight?)  10/15/16 202 lb 4 oz (91.7 kg)   Weight  08/13/2017 is 217 pounds Weight September 26, 2017 is 211 pounds   ASSESSMENT AND PLAN:  Essential hypertension - Blood pressure is well controlled on today's visit. No changes made to the medications. Stable    Coronary artery disease involving native coronary artery of native heart without angina pectoris Plan: EKG 12-Lead Stable, denies any anginal symptoms  Atrial flutter, typical Plan: On anticoagulation, rate well controlled Long discussion with him whether to restore normal sinus rhythm As on his previous office visit reports that he did not notice any symptoms We will continue to monitor for now  S/P AVR (aortic valve replacement) Plan: Well-functioning aortic valve in early 2018 Tolerating anticoagulation  echocardiogram ordered to evaluate mechanical valve He will bridge his warfarin with Lovenox for upcoming knee procedure  SOB (shortness of breath) Plan: Taking Lasix daily, denies having significant shortness of breath  chronic, stable Repeat echocardiogram ordered given elevated right heart pressures/pulmonary hypertension  Lower extremity edema Currently takes Lasix 40 daily Recommend increase Lasix up to Lasix 40 twice daily until the torsemide comes in the  mail Then take torsemide 40 mg daily with 2 potassium pills daily. Suggested he look for Ace wraps for his legs Suggested he call us with his weight in 1 to 2 weeks Moderate fluid intake   Total encounter time more than 25 minutes  Greater than 50% was spent in counseling and coordination of care with the patient  Disposition:   F/U  1 month   No orders of the defined types were placed in this encounter.  I, Jesus Reyes am acting as a Neurosurgeon for Julien Nordmann, M.D., Ph.D.  I, Julien Nordmann, M.D. Ph.D., have reviewed the above documentation for accuracy and completeness, and I agree with the above.  Signed, Dossie Arbour, M.D., Ph.D. 02/08/2019  [facility] [place], [place] [phone]

## 2019-02-08 NOTE — Patient Instructions (Addendum)
Medication Instructions:   Take lasix 40 BID with 2 (20 mEq) potassium until the torsemide comes in the mail Then take torsemide 40 daily with 2 (20 mEq) potassium daily  If you need a refill on your cardiac medications before your next appointment, please call your pharmacy.    Lab work: No new labs needed   If you have labs (blood work) drawn today and your tests are completely normal, you will receive your results only by: Marland Kitchen MyChart Message (if you have MyChart) OR . A paper copy in the mail If you have any lab test that is abnormal or we need to change your treatment, we will call you to review the results.   Testing/Procedures: No new testing needed   Follow-Up: At [facility], you and your health needs are our priority.  As part of our continuing mission to provide you with exceptional heart care, we have created designated Provider Care Teams.  These Care Teams include your primary Cardiologist (physician) and Advanced Practice Providers (APPs -  Physician Assistants and Nurse Practitioners) who all work together to provide you with the care you need, when you need it.  . You will need a follow up appointment in 1 months. .   Please call our office 1 months in advance to schedule this appointment.     . Providers on your designated Care Team:   . Nicolasa Ducking, NP . Eula Listen, PA-C . [name], PA-C  Any Other Special Instructions Will Be Listed Below (If Applicable).  For educational health videos Log in to : [email or web address] Or : [email or web address], password : [email or web address]

## 2019-02-09 ENCOUNTER — Encounter: Payer: Self-pay | Admitting: Student in an Organized Health Care Education/Training Program

## 2019-02-09 ENCOUNTER — Other Ambulatory Visit: Payer: Self-pay

## 2019-02-09 ENCOUNTER — Ambulatory Visit
Payer: Medicare Other | Attending: Student in an Organized Health Care Education/Training Program | Admitting: Student in an Organized Health Care Education/Training Program

## 2019-02-09 VITALS — BP 151/72 | HR 50 | Temp 97.6°F | Resp 16 | Ht 71.0 in | Wt 220.0 lb

## 2019-02-09 DIAGNOSIS — M1712 Unilateral primary osteoarthritis, left knee: Secondary | ICD-10-CM | POA: Insufficient documentation

## 2019-02-09 DIAGNOSIS — M25562 Pain in left knee: Secondary | ICD-10-CM | POA: Diagnosis not present

## 2019-02-09 DIAGNOSIS — G894 Chronic pain syndrome: Secondary | ICD-10-CM | POA: Insufficient documentation

## 2019-02-09 DIAGNOSIS — Z7901 Long term (current) use of anticoagulants: Secondary | ICD-10-CM | POA: Insufficient documentation

## 2019-02-09 DIAGNOSIS — G8929 Other chronic pain: Secondary | ICD-10-CM | POA: Insufficient documentation

## 2019-02-09 DIAGNOSIS — Z952 Presence of prosthetic heart valve: Secondary | ICD-10-CM | POA: Diagnosis present

## 2019-02-09 NOTE — Progress Notes (Signed)
Safety precautions to be maintained throughout the outpatient stay will include: orient to surroundings, keep bed in low position, maintain call bell within reach at all times, provide assistance with transfer out of bed and ambulation.  

## 2019-02-09 NOTE — Progress Notes (Signed)
Patient's Name: [name]  MRN: [id number]  Referring Provider: Baxter Hire, MD  DOB: 12/27/1930  PCP: Baxter Hire, MD  DOS: 02/09/2019  Note by: Gillis Santa, MD  Service setting: Ambulatory outpatient  Specialty: Interventional Pain Management  Location: [facility] (AMB) Pain Management Facility    Patient type: Established   Primary Reason(s) for Visit: Encounter for post-procedure evaluation of chronic illness with mild to moderate exacerbation CC: Knee Pain (left)  HPI  [name] is a 85 y.o. year old, male patient, who comes today for a post-procedure evaluation. He has GERD (gastroesophageal reflux disease); Bradycardia; Depression; Anxiety; Hypertension; Decreased hearing; Coronary artery disease; Aortic stenosis; Warfarin anticoagulation; Carotid bruit; Anemia; Blood loss anemia; S/P AVR (aortic valve replacement); C. difficile colitis; RLS (restless legs syndrome); Bilateral leg edema; Encounter for anticoagulation discussion and counseling; Chronic hyponatremia; Chronic diastolic CHF (congestive heart failure) ([place]); Lymphedema; Obstructive sleep apnea; and H/O atrial flutter on their problem list. His primarily concern today is the Knee Pain (left)  Pain Assessment: Location: Left Knee Radiating:   Onset: More than a month ago Duration: Chronic pain Quality: Dull Severity: 5 /10 (subjective, self-reported pain score)  Note: Reported level is compatible with observation.                         When using our objective Pain Scale, levels between 6 and 10/10 are said to belong in an emergency room, as it progressively worsens from a 6/10, described as severely limiting, requiring emergency care not usually available at an outpatient pain management facility. At a 6/10 level, communication becomes difficult and requires great effort. Assistance to reach the emergency department may be required. Facial flushing and profuse sweating along with potentially dangerous increases  in heart rate and blood pressure will be evident. Effect on ADL:   Timing: Intermittent Modifying factors: topical freeze BP: (!) 151/72  HR: (!) 50  [name] comes in today for post-procedure evaluation.  Further details on both, my assessment(s), as well as the proposed treatment plan, please see below.  Post-Procedure Assessment  12/28/2018 Procedure: Left knee genicular RFA  Influential Factors: BMI: 30.68 kg/m Intra-procedural challenges: None observed.         Assessment challenges: None detected.              Reported side-effects: None.        Post-procedural adverse reactions or complications: None reported         Sedation: Please see nurses note. When no sedatives are used, the analgesic levels obtained are directly associated to the effectiveness of the local anesthetics. However, when sedation is provided, the level of analgesia obtained during the initial 1 hour following the intervention, is believed to be the result of a combination of factors. These factors may include, but are not limited to: 1. The effectiveness of the local anesthetics used. 2. The effects of the analgesic(s) and/or anxiolytic(s) used. 3. The degree of discomfort experienced by the patient at the time of the procedure. 4. The patients ability and reliability in recalling and recording the events. 5. The presence and influence of possible secondary gains and/or psychosocial factors. Reported result: Relief experienced during the 1st hour after the procedure: 100 % (Ultra-Short Term Relief)            Interpretative annotation: Clinically appropriate result. Analgesia during this period is likely to be Local Anesthetic and/or IV Sedative (Analgesic/Anxiolytic) related.  Effects of local anesthetic: The analgesic effects attained during this period are directly associated to the localized infiltration of local anesthetics and therefore cary significant diagnostic value as to the etiological  location, or anatomical origin, of the pain. Expected duration of relief is directly dependent on the pharmacodynamics of the local anesthetic used. Long-acting (4-6 hours) anesthetics used.  Reported result: Relief during the next 4 to 6 hour after the procedure: 100 % (Short-Term Relief)            Interpretative annotation: Clinically appropriate result. Analgesia during this period is likely to be Local Anesthetic-related.          Long-term benefit: Defined as the period of time past the expected duration of local anesthetics (1 hour for short-acting and 4-6 hours for long-acting). With the possible exception of prolonged sympathetic blockade from the local anesthetics, benefits during this period are typically attributed to, or associated with, other factors such as analgesic sensory neuropraxia, antiinflammatory effects, or beneficial biochemical changes provided by agents other than the local anesthetics.  Reported result: Extended relief following procedure: 50 % (Long-Term Relief)            Interpretative annotation: Clinically possible results. Good relief. No permanent benefit expected. Inflammation plays a part in the etiology to the pain.          Current benefits: Defined as reported results that persistent at this point in time.   Analgesia: 50 %            Function: Somewhat improved ROM: Somewhat improved Interpretative annotation: Ongoing benefit. No permanent benefit expected. Effective therapeutic approach.          Interpretation: Results would suggest a successful therapeutic intervention.                  Plan:  Please see "Plan of Care" for details.                Laboratory Chemistry  Inflammation Markers (CRP: Acute Phase) (ESR: Chronic Phase) Lab Results  Component Value Date   CRP <0.8 02/05/2018   ESRSEDRATE 7 02/05/2018                         Rheumatology Markers No results found for: RF, ANA, LABURIC, URICUR, LYMEIGGIGMAB, LYMEABIGMQN, HLAB27                       Renal Function Markers Lab Results  Component Value Date   BUN 16 11/24/2018   CREATININE 0.97 11/24/2018   BCR 15 10/27/2018   GFRAA >60 11/24/2018   GFRNONAA >60 11/24/2018                             Hepatic Function Markers Lab Results  Component Value Date   AST 23 02/22/2017   ALT 15 (L) 02/22/2017   ALBUMIN 4.3 02/22/2017   ALKPHOS 67 02/22/2017   LIPASE 116 07/28/2014                        Electrolytes Lab Results  Component Value Date   NA 131 (L) 11/24/2018   K 4.0 11/24/2018   CL 96 (L) 11/24/2018   CALCIUM 8.6 (L) 11/24/2018   MG 2.0 04/17/2015  Neuropathy Markers Lab Results  Component Value Date   HGBA1C 5.3 10/31/2014                        CNS Tests No results found for: COLORCSF, APPEARCSF, RBCCOUNTCSF, WBCCSF, POLYSCSF, LYMPHSCSF, EOSCSF, PROTEINCSF, GLUCCSF, JCVIRUS, CSFOLI, IGGCSF                      Bone Pathology Markers No results found for: VD25OH, [id number], XQ1194RD4, [id number], 25OHVITD1, 25OHVITD2, 25OHVITD3, TESTOFREE, TESTOSTERONE                       Coagulation Parameters Lab Results  Component Value Date   INR 3.0 02/08/2019   LABPROT 22.7 (H) 08/07/2015   APTT 40 (H) 04/02/2015   PLT 145 (L) 11/24/2018                        Cardiovascular Markers Lab Results  Component Value Date   BNP 467.0 (H) 10/27/2018   CKTOTAL 71 08/31/2010   CKMB 1.6 08/31/2010   TROPONINI <0.03 11/24/2018   HGB 12.3 (L) 11/24/2018   HCT 36.9 (L) 11/24/2018                         CA Markers No results found for: CEA, CA125, LABCA2                      Endocrine Markers Lab Results  Component Value Date   TSH 3.090 02/18/2017                        Note: Lab results reviewed.  Recent Diagnostic Imaging Results  DG C-Arm 1-60 Min-No Report Fluoroscopy was utilized by the requesting physician.  No radiographic  interpretation.   Complexity Note: Imaging results reviewed. Results shared with Mr.  [name], using Layman's terms.                               Meds   Current Outpatient Medications:  .  Cetirizine HCl 10 MG CAPS, Take 10 mg by mouth daily., Disp: , Rfl:  .  citalopram (CELEXA) 20 MG tablet, Take 20 mg by mouth daily., Disp: , Rfl:  .  cloNIDine (CATAPRES) 0.1 MG tablet, TAKE 1 TABLET BY MOUTH TWO  TIMES DAILY, Disp: 180 tablet, Rfl: 2 .  doxazosin (CARDURA) 8 MG tablet, TAKE 1 TABLET BY MOUTH  TWICE A DAY, Disp: 180 tablet, Rfl: 0 .  finasteride (PROSCAR) 5 MG tablet, Take 5 mg by mouth daily.  , Disp: , Rfl:  .  furosemide (LASIX) 40 MG tablet, Take 1 tablet (40 mg total) by mouth daily., Disp: 90 tablet, Rfl: 3 .  isosorbide mononitrate (IMDUR) 60 MG 24 hr tablet, Take 1 tablet (60 mg total) by mouth 2 (two) times daily., Disp: 28 tablet, Rfl: 0 .  metoprolol succinate (TOPROL-XL) 25 MG 24 hr tablet, Take 0.5 tablets (12.5 mg total) by mouth daily., Disp: 90 tablet, Rfl: 3 .  potassium chloride (K-DUR) 10 MEQ tablet, Take 2 tablets (20 mEq total) by mouth daily., Disp: 180 tablet, Rfl: 3 .  ranitidine (ZANTAC) 150 MG tablet, Take 150 mg by mouth as needed., Disp: , Rfl:  .  rOPINIRole (REQUIP) 2 MG tablet, TAKE 1 TABLET BY MOUTH 4  TIMES  DAILY, Disp: 360 tablet, Rfl: 3 .  Tamsulosin HCl (FLOMAX) 0.4 MG CAPS, Take 0.4 mg by mouth at bedtime. , Disp: , Rfl:  .  torsemide (DEMADEX) 20 MG tablet, Take 2 tablets (40 mg total) by mouth daily., Disp: 180 tablet, Rfl: 3 .  traZODone (DESYREL) 50 MG tablet, Take 50 mg by mouth at bedtime., Disp: , Rfl:  .  warfarin (COUMADIN) 5 MG tablet, Take 1 tablet daily except 1 and 1/2 tabs on Monday, Wednesday, and Friday or as directed by [facility]., Disp: 120 tablet, Rfl: 0 .  enoxaparin (LOVENOX) 100 MG/ML injection, Inject 1 mL (100 mg total) into the skin every 12 (twelve) hours. (Patient not taking: Reported on 02/09/2019), Disp: 20 Syringe, Rfl: 1  ROS  Constitutional: Denies any fever or chills Gastrointestinal: No reported  hemesis, hematochezia, vomiting, or acute GI distress Musculoskeletal: Denies any acute onset joint swelling, redness, loss of ROM, or weakness Neurological: No reported episodes of acute onset apraxia, aphasia, dysarthria, agnosia, amnesia, paralysis, loss of coordination, or loss of consciousness  Allergies  [name] is allergic to levofloxacin and sulfa antibiotics.  [name]  Drug: [name]  reports no history of drug use. Alcohol:  reports current alcohol use of about 3.0 standard drinks of alcohol per week. Tobacco:  reports that he quit smoking about 41 years ago. His smoking use included cigarettes. He has never used smokeless tobacco. Medical:  has a past medical history of Anxiety (10/11), Aortic stenosis, Bradycardia, C. difficile colitis, Carotid bruit, Chronic diastolic CHF (congestive heart failure) ([place]), Coronary artery disease, Decreased hearing, Depression, Gastric ulcer, GERD (gastroesophageal reflux disease), Hypertension, Mod-Sev Tricuspid regurgitation, RLS (restless legs syndrome) (08/23/2015), S/P AVR, and SOB (shortness of breath) (10/11). Surgical: [name]  has a past surgical history that includes Valve replacement (1/02); Cardiac catheterization; Joint replacement; Hernia repair; Esophagogastroduodenoscopy (N/A, 04/05/2015); Esophagogastroduodenoscopy (N/A, 04/17/2015); Esophagogastroduodenoscopy (N/A, 08/02/2015); and Total hip arthroplasty. Family: family history includes Diabetes type II in his mother; Heart attack in his mother; Heart disease in his father and mother; Hypertension in his father, mother, and another family member; Stroke in his brother, brother, and father.  Constitutional Exam  General appearance: Well nourished, well developed, and well hydrated. In no apparent acute distress Vitals:   02/09/19 1130  BP: (!) 151/72  Pulse: (!) 50  Resp: 16  Temp: 97.6 F (36.4 C)  TempSrc: Oral  SpO2: 99%  Weight: 220 lb (99.8 kg)  Height: 5' 11"  (1.803 m)    BMI Assessment: Estimated body mass index is 30.68 kg/m as calculated from the following:   Height as of this encounter: 5' 11"  (1.803 m).   Weight as of this encounter: 220 lb (99.8 kg).  BMI interpretation table: BMI level Category Range association with higher incidence of chronic pain  <18 kg/m2 Underweight   18.5-24.9 kg/m2 Ideal body weight   25-29.9 kg/m2 Overweight Increased incidence by 20%  30-34.9 kg/m2 Obese (Class I) Increased incidence by 68%  35-39.9 kg/m2 Severe obesity (Class II) Increased incidence by 136%  >40 kg/m2 Extreme obesity (Class III) Increased incidence by 254%   Patient's current BMI Ideal Body weight  Body mass index is 30.68 kg/m. Ideal body weight: 75.3 kg (166 lb 0.1 oz) Adjusted ideal body weight: 85.1 kg (187 lb 9.7 oz)   BMI Readings from Last 4 Encounters:  02/09/19 30.68 kg/m  02/08/19 30.68 kg/m  12/28/18 29.29 kg/m  11/24/18 28.59 kg/m   Wt Readings from Last 4 Encounters:  02/09/19  220 lb (99.8 kg)  02/08/19 220 lb (99.8 kg)  12/28/18 210 lb (95.3 kg)  11/24/18 205 lb (93 kg)  Psych/Mental status: Alert, oriented x 3 (person, place, & time)       Eyes: PERLA Respiratory: No evidence of acute respiratory distress  Lumbar Spine Area Exam  Skin & Axial Inspection: No masses, redness, or swelling Alignment: Symmetrical Functional ROM: Decreased ROM       Stability: No instability detected Muscle Tone/Strength: Functionally intact. No obvious neuro-muscular anomalies detected. Sensory (Neurological): Musculoskeletal pain pattern Palpation: No palpable anomalies       Provocative Tests: Hyperextension/rotation test: deferred today       Lumbar quadrant test (Kemp's test): deferred today       Lateral bending test: deferred today       Patrick's Maneuver: deferred today                   FABER* test: deferred today                   S-I anterior distraction/compression test: deferred today         S-I lateral compression  test: deferred today         S-I Thigh-thrust test: deferred today         S-I Gaenslen's test: deferred today         *(Flexion, ABduction and External Rotation)  Gait & Posture Assessment  Ambulation: Patient ambulates using a walker Gait: Limited. Using assistive device to ambulate Posture: Difficulty standing up straight, due to pain   Lower Extremity Exam    Side: Right lower extremity  Side: Left lower extremity  Stability: No instability observed          Stability: No instability observed          Skin & Extremity Inspection: Skin color, temperature, and hair growth are WNL. No peripheral edema or cyanosis. No masses, redness, swelling, asymmetry, or associated skin lesions. No contractures.  Skin & Extremity Inspection: Skin color, temperature, and hair growth are WNL. No peripheral edema or cyanosis. No masses, redness, swelling, asymmetry, or associated skin lesions. No contractures.  Functional ROM: Unrestricted ROM                  Functional ROM: Improved after treatment                  Muscle Tone/Strength: Functionally intact. No obvious neuro-muscular anomalies detected.  Muscle Tone/Strength: Functionally intact. No obvious neuro-muscular anomalies detected.  Sensory (Neurological): Unimpaired        Sensory (Neurological): Improved        DTR: Patellar: deferred today Achilles: deferred today Plantar: deferred today  DTR: Patellar: deferred today Achilles: deferred today Plantar: deferred today  Palpation: No palpable anomalies  Palpation: No palpable anomalies   Assessment   Status Diagnosis  Controlled Controlled Controlled 1. Primary osteoarthritis of left knee   2. Chronic pain of left knee   3. Chronic pain syndrome   4. S/P AVR (aortic valve replacement)   5. Warfarin anticoagulation     General Recommendations: The pain condition that the patient suffers from is best treated with a multidisciplinary approach that involves an increase in physical  activity to prevent de-conditioning and worsening of the pain cycle, as well as psychological counseling (formal and/or informal) to address the co-morbid psychological affects of pain. Treatment will often involve judicious use of pain medications and interventional procedures to decrease the pain,  allowing the patient to participate in the physical activity that will ultimately produce long-lasting pain reductions. The goal of the multidisciplinary approach is to return the patient to a higher level of overall function and to restore their ability to perform activities of daily living.   85 year old male with a history of osteoarthritis of left knee status post left knee genicular RFA.  Patient follows up today for post procedure evaluation.  He states that he does notice moderate pain relief after his left knee genicular radiofrequency ablation.  He states that he is able to bear weight on his knee as he is walking with his walker without as much pain.  Overall I am satisfied with his results and I will see the patient back in 6 months to assess his progress.  Can consider repeating RFA at that time should patient have return of his symptoms.   Provider-requested follow-up: Return in about 6 months (around 08/12/2019).  Future Appointments  Date Time Provider [place]  02/10/2019  1:30 PM Emiliano Dyer [facility]  03/08/2019 11:15 AM [facility]  03/12/2019 10:30 AM Rise Mu, PA-C [facility]  05/04/2019  3:15 PM Suzzanne Cloud, NP [facility] None  08/03/2019 11:30 AM Gillis Santa, MD [facility] None    Primary Care Physician: Baxter Hire, MD Location: [facility] Note by: Gillis Santa, M.D Date: 02/09/2019; Time: 12:30 PM  There are no Patient Instructions on file for this visit.

## 2019-02-10 ENCOUNTER — Ambulatory Visit: Payer: Medicare Other | Admitting: Podiatry

## 2019-02-10 ENCOUNTER — Encounter

## 2019-02-10 ENCOUNTER — Other Ambulatory Visit: Payer: Self-pay

## 2019-02-10 DIAGNOSIS — Z9229 Personal history of other drug therapy: Secondary | ICD-10-CM

## 2019-02-10 DIAGNOSIS — B351 Tinea unguium: Secondary | ICD-10-CM

## 2019-02-10 DIAGNOSIS — M79675 Pain in left toe(s): Secondary | ICD-10-CM | POA: Diagnosis not present

## 2019-02-10 DIAGNOSIS — M79674 Pain in right toe(s): Secondary | ICD-10-CM | POA: Diagnosis not present

## 2019-02-10 DIAGNOSIS — L84 Corns and callosities: Secondary | ICD-10-CM | POA: Diagnosis not present

## 2019-02-10 NOTE — Patient Instructions (Signed)
Corns and Calluses Corns are small areas of thickened skin that occur on the top, sides, or tip of a toe. They contain a cone-shaped core with a point that can press on a nerve below. This causes pain.  Calluses are areas of thickened skin that can occur anywhere on the body, including the hands, fingers, palms, soles of the feet, and heels. Calluses are usually larger than corns. What are the causes? Corns and calluses are caused by rubbing (friction) or pressure, such as from shoes that are too tight or do not fit properly. What increases the risk? Corns are more likely to develop in people who have misshapen toes (toe deformities), such as hammer toes. Calluses can occur with friction to any area of the skin. They are more likely to develop in people who:  Work with their hands.  Wear shoes that fit poorly, are too tight, or are high-heeled.  Have toe deformities. What are the signs or symptoms? Symptoms of a corn or callus include:  A hard growth on the skin.  Pain or tenderness under the skin.  Redness and swelling.  Increased discomfort while wearing tight-fitting shoes, if your feet are affected. If a corn or callus becomes infected, symptoms may include:  Redness and swelling that gets worse.  Pain.  Fluid, blood, or pus draining from the corn or callus. How is this diagnosed? Corns and calluses may be diagnosed based on your symptoms, your medical history, and a physical exam. How is this treated? Treatment for corns and calluses may include:  Removing the cause of the friction or pressure. This may involve: ? Changing your shoes. ? Wearing shoe inserts (orthotics) or other protective layers in your shoes, such as a corn pad. ? Wearing gloves.  Applying medicine to the skin (topical medicine) to help soften skin in the hardened, thickened areas.  Removing layers of dead skin with a file to reduce the size of the corn or callus.  Removing the corn or callus with a  scalpel or laser.  Taking antibiotic medicines, if your corn or callus is infected.  Having surgery, if a toe deformity is the cause. Follow these instructions at home:   Take over-the-counter and prescription medicines only as told by your health care provider.  If you were prescribed an antibiotic, take it as told by your health care provider. Do not stop taking it even if your condition starts to improve.  Wear shoes that fit well. Avoid wearing high-heeled shoes and shoes that are too tight or too loose.  Wear any padding, protective layers, gloves, or orthotics as told by your health care provider.  Soak your hands or feet and then use a file or pumice stone to soften your corn or callus. Do this as told by your health care provider.  Check your corn or callus every day for symptoms of infection. Contact a health care provider if you:  Notice that your symptoms do not improve with treatment.  Have redness or swelling that gets worse.  Notice that your corn or callus becomes painful.  Have fluid, blood, or pus coming from your corn or callus.  Have new symptoms. Summary  Corns are small areas of thickened skin that occur on the top, sides, or tip of a toe.  Calluses are areas of thickened skin that can occur anywhere on the body, including the hands, fingers, palms, and soles of the feet. Calluses are usually larger than corns.  Corns and calluses are caused by   rubbing (friction) or pressure, such as from shoes that are too tight or do not fit properly.  Treatment may include wearing any padding, protective layers, gloves, or orthotics as told by your health care provider. This information is not intended to replace advice given to you by your health care provider. Make sure you discuss any questions you have with your health care provider. Document Released: 08/17/2004 Document Revised: 09/24/2017 Document Reviewed: 09/24/2017 Elsevier Interactive Patient Education   2019 [employer]  Onychomycosis/Fungal Toenails  WHAT IS IT? An infection that lies within the keratin of your nail plate that is caused by a fungus.  WHY ME? Fungal infections affect all ages, sexes, races, and creeds.  There may be many factors that predispose you to a fungal infection such as age, coexisting medical conditions such as diabetes, or an autoimmune disease; stress, medications, fatigue, genetics, etc.  Bottom line: fungus thrives in a warm, moist environment and your shoes offer such a location.  IS IT CONTAGIOUS? Theoretically, yes.  You do not want to share shoes, nail clippers or files with someone who has fungal toenails.  Walking around barefoot in the same room or sleeping in the same bed is unlikely to transfer the organism.  It is important to realize, however, that fungus can spread easily from one nail to the next on the same foot.  HOW DO WE TREAT THIS?  There are several ways to treat this condition.  Treatment may depend on many factors such as age, medications, pregnancy, liver and kidney conditions, etc.  It is best to ask your doctor which options are available to you.  1. No treatment.   Unlike many other medical concerns, you can live with this condition.  However for many people this can be a painful condition and may lead to ingrown toenails or a bacterial infection.  It is recommended that you keep the nails cut short to help reduce the amount of fungal nail. 2. Topical treatment.  These range from herbal remedies to prescription strength nail lacquers.  About 40-50% effective, topicals require twice daily application for approximately 9 to 12 months or until an entirely new nail has grown out.  The most effective topicals are medical grade medications available through physicians offices. 3. Oral antifungal medications.  With an 80-90% cure rate, the most common oral medication requires 3 to 4 months of therapy and stays in your system for a year as the new nail  grows out.  Oral antifungal medications do require blood work to make sure it is a safe drug for you.  A liver function panel will be performed prior to starting the medication and after the first month of treatment.  It is important to have the blood work performed to avoid any harmful side effects.  In general, this medication safe but blood work is required. 4. Laser Therapy.  This treatment is performed by applying a specialized laser to the affected nail plate.  This therapy is noninvasive, fast, and non-painful.  It is not covered by insurance and is therefore, out of pocket.  The results have been very good with a 80-95% cure rate.  The [facility] is the only practice in the area to offer this therapy. 5. Permanent Nail Avulsion.  Removing the entire nail so that a new nail will not grow back. 

## 2019-02-11 ENCOUNTER — Telehealth: Payer: Self-pay | Admitting: Cardiovascular Disease

## 2019-02-11 NOTE — Telephone Encounter (Signed)
Patient calling States that his legs have started retaining a lot of fluid His legs are very sore and his skin is starting to crack open Please call to discuss

## 2019-02-11 NOTE — Telephone Encounter (Signed)
Left voicemail message to call back  

## 2019-02-11 NOTE — Telephone Encounter (Signed)
Patient returning call.

## 2019-02-15 ENCOUNTER — Telehealth: Payer: Self-pay | Admitting: Cardiovascular Disease

## 2019-02-15 NOTE — Telephone Encounter (Signed)
Spoke w/ pt.  He reports that he was given doxycycline 100 mg x 10 days, started Friday. Pt has taken before w/ no ill effect, advised him that we can see him sooner than 4/13 if he would like, but we are trying to limit visits due to COVID-19. Pt would like to keep appt on 4/13, but will call if he has any s/s of bleed.  Pt is appreciative of the call.

## 2019-02-15 NOTE — Telephone Encounter (Signed)
Patient calling  Patient has a coumadin appointment scheduled for 4/13 Patient states he went to his PCP and they have started him on an antibiotic Would like to know if he will need to be seen sooner for coumadin due to medication change Please call to discuss

## 2019-02-16 NOTE — Telephone Encounter (Signed)
Attempted to call the patient. I left a message for him to please call back to follow up on his symptoms.

## 2019-02-18 ENCOUNTER — Encounter: Payer: Self-pay | Admitting: Podiatry

## 2019-02-18 NOTE — Telephone Encounter (Signed)
Called patient. States he's been to his PCP, Dr Michaela Corner at [facility], for his swelling in the legs. Has been put on an antibiotic and is being treated. States he has not seen much improvement but is going to call his PCP back today. Denies chest pain or shortness of breath. Patient advised to call us if any new questions or concerns arise.

## 2019-02-18 NOTE — Progress Notes (Signed)
Subjective: [name] is a 85 y.o. y.o. male who is on long term blood thinner Coumadin and presents for management of painful, mycotic toenails and corns and calluses. Pain is aggravated when wearing enclosed shoe gear and is relieved with periodic professional debridement.  Gracelyn Nurse, MD is his PCP.    Current Outpatient Medications:  .  Cetirizine HCl 10 MG CAPS, Take 10 mg by mouth daily., Disp: , Rfl:  .  citalopram (CELEXA) 20 MG tablet, Take 20 mg by mouth daily., Disp: , Rfl:  .  cloNIDine (CATAPRES) 0.1 MG tablet, TAKE 1 TABLET BY MOUTH TWO  TIMES DAILY, Disp: 180 tablet, Rfl: 2 .  doxazosin (CARDURA) 8 MG tablet, TAKE 1 TABLET BY MOUTH  TWICE A DAY, Disp: 180 tablet, Rfl: 0 .  enoxaparin (LOVENOX) 100 MG/ML injection, Inject 1 mL (100 mg total) into the skin every 12 (twelve) hours. (Patient not taking: Reported on 02/09/2019), Disp: 20 Syringe, Rfl: 1 .  finasteride (PROSCAR) 5 MG tablet, Take 5 mg by mouth daily.  , Disp: , Rfl:  .  furosemide (LASIX) 40 MG tablet, Take 1 tablet (40 mg total) by mouth daily., Disp: 90 tablet, Rfl: 3 .  isosorbide mononitrate (IMDUR) 60 MG 24 hr tablet, Take 1 tablet (60 mg total) by mouth 2 (two) times daily., Disp: 28 tablet, Rfl: 0 .  metoprolol succinate (TOPROL-XL) 25 MG 24 hr tablet, Take 0.5 tablets (12.5 mg total) by mouth daily., Disp: 90 tablet, Rfl: 3 .  potassium chloride (K-DUR) 10 MEQ tablet, Take 2 tablets (20 mEq total) by mouth daily., Disp: 180 tablet, Rfl: 3 .  ranitidine (ZANTAC) 150 MG tablet, Take 150 mg by mouth as needed., Disp: , Rfl:  .  rOPINIRole (REQUIP) 2 MG tablet, TAKE 1 TABLET BY MOUTH 4  TIMES DAILY, Disp: 360 tablet, Rfl: 3 .  Tamsulosin HCl (FLOMAX) 0.4 MG CAPS, Take 0.4 mg by mouth at bedtime. , Disp: , Rfl:  .  torsemide (DEMADEX) 20 MG tablet, Take 2 tablets (40 mg total) by mouth daily., Disp: 180 tablet, Rfl: 3 .  traZODone (DESYREL) 50 MG tablet, Take 50 mg by mouth at bedtime., Disp: , Rfl:   .  warfarin (COUMADIN) 5 MG tablet, Take 1 tablet daily except 1 and 1/2 tabs on Monday, Wednesday, and Friday or as directed by [facility]., Disp: 120 tablet, Rfl: 0   Allergies  Allergen Reactions  . Levofloxacin Nausea Only  . Sulfa Antibiotics Other (See Comments)    Reaction:  Unknown      Objective: Vascular Examination: Capillary refill time immediate x 10 digits.  Dorsalis pedis pulses 1/4 b/l.  Posterior tibial pulses 1/4 b/l.  No digital hair x 10 digits.  Skin temperature gradient WNL b/l.  Dermatological Examination: Skin with normal, turgor, texture and tone b/l.  Venous stasis skin changes noted b/l. No breaks in skin.  Toenails 1-5 b/l discolored, thick, dystrophic with subungual debris and pain with palpation to nailbeds due to thickness of nails.  Hyperkeratotic lesions dorsal PIPJ left 4th and 5th digits and distal tips b/l 2nd digits, submet head 5 b/l. No erythema, no edema, no drainage, no flocculence noted.   Musculoskeletal: Muscle strength 5/5 to all LE muscle groups.  Hammertoes 2-5 b/l.  Neurological: Sensation intact with 10 gram monofilament.  Vibratory sensation intact.  Assessment: 1. Painful onychomycosis toenails 1-5 b/l in patient on blood thinner.  2. Corns left 4th, left 5th, b/l 2nd digits 3. Calluses submet  head 5 b/l  Plan: 1. Toenails 1-5 b/l were debrided in length and girth without iatrogenic bleeding. 2. Hyperkeratotic lesion(s) dorsal PIPJ left 4th, left 5th digits, distal tips b/l 2nd digits, submet head 5 b/l pared utilizing sterile scalpel blade without incident. Patient to continue soft, supportive shoe gear daily. 3. Patient to report any pedal injuries to medical professional immediately. 4. Avoid self trimming due to use of blood thinner. 5. Follow up 10 weeks. 6. Patient/POA to call should there be a concern in the interim.

## 2019-02-19 ENCOUNTER — Other Ambulatory Visit: Payer: Self-pay | Admitting: *Deleted

## 2019-02-19 MED ORDER — WARFARIN SODIUM 5 MG PO TABS: ORAL_TABLET | ORAL | 1 refills | Status: DC

## 2019-02-25 ENCOUNTER — Telehealth: Payer: Self-pay

## 2019-02-25 NOTE — Telephone Encounter (Signed)
.   Virtual Visit Pre-Appointment Phone Call  Steps For Call:  1. Confirm consent - "In the setting of the current Covid19 crisis, you are scheduled for a (phone or video) visit with your provider on 03/12/2019 at 10:30AM.  Just as we do with many in-office visits, in order for you to participate in this visit, we must obtain consent.  If you'd like, I can send this to your mychart (if signed up) or email for you to review.  Otherwise, I can obtain your verbal consent now.  All virtual visits are billed to your insurance company just like a normal visit would be.  By agreeing to a virtual visit, we'd like you to understand that the technology does not allow for your provider to perform an examination, and thus may limit your provider's ability to fully assess your condition.  Finally, though the technology is pretty good, we cannot assure that it will always work on either your or our end, and in the setting of a video visit, we may have to convert it to a phone-only visit.  In either situation, we cannot ensure that we have a secure connection.  Are you willing to proceed?"  2. Give patient instructions for WebEx download to smartphone as below if video visit  3. Advise patient to be prepared with any vital sign or heart rhythm information, their current medicines, and a piece of paper and pen handy for any instructions they may receive the day of their visit  4. Inform patient they will receive a phone call 15 minutes prior to their appointment time (may be from unknown caller ID) so they should be prepared to answer  5. Confirm that appointment type is correct in Epic appointment notes (video vs telephone)    TELEPHONE CALL NOTE  Newell Brewster Kuzara has been deemed a candidate for a follow-up tele-health visit to limit community exposure during the Covid-19 pandemic. I spoke with the patient via phone to ensure availability of phone/video source, confirm preferred email & phone number, and  discuss instructions and expectations.  I reminded Dasan Teige Garfias to be prepared with any vital sign and/or heart rhythm information that could potentially be obtained via home monitoring, at the time of his visit. I reminded Jammy Jayda Banther to expect a phone call at the time of his visit if his visit.  Did the patient verbally acknowledge consent to treatment? YES  Andi Devon 02/25/2019 3:55 PM  CONSENT FOR TELE-HEALTH VISIT - PLEASE REVIEW  I hereby voluntarily request, consent and authorize [facility] and its employed or contracted physicians, physician assistants, nurse practitioners or other licensed health care professionals (the Practitioner), to provide me with telemedicine health care services (the Services") as deemed necessary by the treating Practitioner. I acknowledge and consent to receive the Services by the Practitioner via telemedicine. I understand that the telemedicine visit will involve communicating with the Practitioner through live audiovisual communication technology and the disclosure of certain medical information by electronic transmission. I acknowledge that I have been given the opportunity to request an in-person assessment or other available alternative prior to the telemedicine visit and am voluntarily participating in the telemedicine visit.  I understand that I have the right to withhold or withdraw my consent to the use of telemedicine in the course of my care at any time, without affecting my right to future care or treatment, and that the Practitioner or I may terminate the telemedicine visit at any time. I understand  that I have the right to inspect all information obtained and/or recorded in the course of the telemedicine visit and may receive copies of available information for a reasonable fee.  I understand that some of the potential risks of receiving the Services via telemedicine include:   Delay or interruption in medical  evaluation due to technological equipment failure or disruption;  Information transmitted may not be sufficient (e.g. poor resolution of images) to allow for appropriate medical decision making by the Practitioner; and/or   In rare instances, security protocols could fail, causing a breach of personal health information.  Furthermore, I acknowledge that it is my responsibility to provide information about my medical history, conditions and care that is complete and accurate to the best of my ability. I acknowledge that Practitioner's advice, recommendations, and/or decision may be based on factors not within their control, such as incomplete or inaccurate data provided by me or distortions of diagnostic images or specimens that may result from electronic transmissions. I understand that the practice of medicine is not an exact science and that Practitioner makes no warranties or guarantees regarding treatment outcomes. I acknowledge that I will receive a copy of this consent concurrently upon execution via email to the email address I last provided but may also request a printed copy by calling the office of [facility].    I understand that my insurance will be billed for this visit.   I have read or had this consent read to me.  I understand the contents of this consent, which adequately explains the benefits and risks of the Services being provided via telemedicine.   I have been provided ample opportunity to ask questions regarding this consent and the Services and have had my questions answered to my satisfaction.  I give my informed consent for the services to be provided through the use of telemedicine in my medical care  By participating in this telemedicine visit I agree to the above.

## 2019-03-05 ENCOUNTER — Telehealth: Payer: Self-pay

## 2019-03-05 NOTE — Telephone Encounter (Signed)
1. Do you currently have a fever? No (yes = cancel and refer to pcp for e-visit) 2. Have you recently travelled on a cruise, internationally, or to [place], [place], [place], [place], [place], or [place], [place] [employer]) ? NO (yes = cancel, stay home, monitor symptoms, and contact pcp or initiate e-visit if symptoms develop) 3. Have you been in contact with someone that is currently pending confirmation of Covid19 testing or has been confirmed to have the Covid19 virus? No(yes = cancel, stay home, away from tested individual, monitor symptoms, and contact pcp or initiate e-visit if symptoms develop) 4. Are you currently experiencing fatigue or cough? No (yes = pt should be prepared to have a mask placed at the time of their visit).  Pt. Advised that we are restricting visitors at this time and anyone present in the vehicle should meet the above criteria as well. Advised that visit will be at curbside for finger stick ONLY and will receive call with instructions. Pt also advised to please bring own pen for signature of arrival document.

## 2019-03-08 ENCOUNTER — Ambulatory Visit (INDEPENDENT_AMBULATORY_CARE_PROVIDER_SITE_OTHER): Payer: Medicare Other

## 2019-03-08 DIAGNOSIS — Z7901 Long term (current) use of anticoagulants: Secondary | ICD-10-CM | POA: Diagnosis not present

## 2019-03-08 DIAGNOSIS — Z952 Presence of prosthetic heart valve: Secondary | ICD-10-CM | POA: Diagnosis not present

## 2019-03-08 DIAGNOSIS — I350 Nonrheumatic aortic (valve) stenosis: Secondary | ICD-10-CM

## 2019-03-08 LAB — POCT INR: INR: 3.5 — AB (ref 2.0–3.0)

## 2019-03-08 NOTE — Patient Instructions (Signed)
Please continue dosage of 1 tablet every day except 1.5 tablets on Mondays, Wednesdays & Fridays.   Recheck in 5 weeks.   

## 2019-03-11 ENCOUNTER — Other Ambulatory Visit: Payer: Self-pay

## 2019-03-11 ENCOUNTER — Telehealth: Payer: Self-pay

## 2019-03-11 ENCOUNTER — Telehealth (INDEPENDENT_AMBULATORY_CARE_PROVIDER_SITE_OTHER): Payer: Medicare Other | Admitting: Cardiovascular Disease

## 2019-03-11 DIAGNOSIS — I5032 Chronic diastolic (congestive) heart failure: Secondary | ICD-10-CM | POA: Diagnosis not present

## 2019-03-11 DIAGNOSIS — I890 Lymphedema, not elsewhere classified: Secondary | ICD-10-CM

## 2019-03-11 DIAGNOSIS — E871 Hypo-osmolality and hyponatremia: Secondary | ICD-10-CM | POA: Diagnosis not present

## 2019-03-11 DIAGNOSIS — I25118 Atherosclerotic heart disease of native coronary artery with other forms of angina pectoris: Secondary | ICD-10-CM | POA: Diagnosis not present

## 2019-03-11 DIAGNOSIS — I4892 Unspecified atrial flutter: Secondary | ICD-10-CM | POA: Diagnosis not present

## 2019-03-11 DIAGNOSIS — G4733 Obstructive sleep apnea (adult) (pediatric): Secondary | ICD-10-CM

## 2019-03-11 DIAGNOSIS — Z952 Presence of prosthetic heart valve: Secondary | ICD-10-CM

## 2019-03-11 MED ORDER — TORSEMIDE 20 MG PO TABS: 20.0000 mg | ORAL_TABLET | Freq: Two times a day (BID) | ORAL | 3 refills | Status: DC

## 2019-03-11 NOTE — Telephone Encounter (Signed)
Called patient.  No Answer. LMOV.  Need to move patients appointment from tomorrow (03/12/2019) to this afternoon (03/11/2019)

## 2019-03-11 NOTE — Telephone Encounter (Signed)
Patient called back.  Made patient aware that Dr. Mariah Milling would need to move his appointment to today (03/11/2019)  Patient was agreeable and appointment was changed to 03/11/2019 @ 1:40pm

## 2019-03-11 NOTE — Progress Notes (Signed)
Telehealth Visit      This visit type was conducted due to national recommendations for restrictions regarding the COVID-19 Pandemic (e.g. social distancing) in an effort to limit this patient's exposure and mitigate transmission in our community.  Due to his co-morbid illnesses, this patient is at least at moderate risk for complications without adequate follow up.  This format is felt to be most appropriate for this patient at this time.  The patient did not have access to video technology/had technical difficulties with video requiring transitioning to audio format only (telephone).  All issues noted in this document were discussed and addressed.  No physical exam could be performed with this format.  Please refer to the patient's chart for his  consent to telehealth for [facility].   I connected with  [name] on 03/11/19 by a video enabled telemedicine application and verified that I am speaking with the correct person using two identifiers. I discussed the limitations of evaluation and management by telemedicine. The patient expressed understanding and agreed to proceed.   Evaluation Performed:  Follow-up visit   Date:  03/11/2019   ID:  [name], DOB Nov 04, 1931, MRN [id number]  Patient Location:  [address] [place] [address]   Provider location:   [facility], [place] office  PCP:  Gracelyn Nurse, MD  Cardiologist:  Hubbard Robinson [facility]   Chief Complaint:  Leg swelling, sore on leg   History of Present Illness:    [name] is a 85 y.o. male who presents via audio/video conferencing for a telehealth visit today.   The patient does not symptoms concerning for COVID-19 infection (fever, chills, cough, or new SHORTNESS OF BREATH).   Patient has a past medical history of h/o aortic stenosis  s/p St. Jude mechanical aortic valve replacement in 2002 on chronic Coumadin therapy,  mild CAD by cardiac cath  in 2011 managed medically,  GERD,  HTN,  carotid bruits,  Chronic hyponatremia asymptomatic bradycardia,  RLS, anxiety, depression,  GI bleed OSA on CPAP osteoarthritis s/p total left hip replacement on 03/21/2015 who was discharged from [facility] the above surgery with a hgb of 8 (baseline 12), he was started on po iron replacement therapy, who presented to on 04/02/15 with acute onset of weakness, melena, BRBPR, and was found to have a hgb of 5.1. Aspirin and warfarin initially held, restarted at a later date, S/p EGD Ejection fraction greater than 55% March 2018, moderately elevated right heart pressures He presents today for follow-up of his aortic valve replacement and hypertension  On his last clinic visit 1 month ago reported Lasix was not working lot of leg swelling.  several weeks.  Denies excessively drinking fluids. He has compression socks, but sometimes he can not put them on due to his legs being too swollen.  Does not eat out frequently weight his gone up about 10 lbs.   On today's discussion he reports that he is doing much better Weight is down 15 to 17 pounds from prior clinic visit 1 month ago Swelling better, "much better" Has a sore on leg, right, shin bone Visiting nurse reports that if it does not get better soon he will need to go to wound clinic TED hose peeled skin off, because the sore  Weight 195, down from 212 Takes torsemide 20 BID Reports sometimes weight down to 188 pounds No recent lab work available Reports he is taking potassium twice a day  Lab  work reviewed No recent labs available  Prior CV studies:   The following studies were reviewed today: Echocardiogram December 2019 Left ventricle: The cavity size was normal. Wall thickness was   increased in a pattern of mild LVH. Systolic function was normal.   The estimated ejection fraction was in the range of 55% to 60%. - Aortic valve: A mechanical prosthesis was present. There was   trivial  regurgitation. Mean gradient (S): 9 mm Hg. - Mitral valve: Calcified annulus. Mildly thickened leaflets . - Left atrium: The atrium was moderately dilated. - Right ventricle: The cavity size was moderately dilated. Systolic   function was mildly to moderately reduced. - Right atrium: The atrium was moderately dilated. - Tricuspid valve: There was moderate-severe regurgitation. - Pulmonary arteries: Systolic pressure was mildly increased, in   the range of 35 mm Hg to 40 mm Hg. - Pericardium, extracardiac: A trivial pericardial effusion was   identified posterior to the heart.  Past Medical History:  Diagnosis Date  . Anxiety 10/11  . Aortic stenosis    a. s/p mechcanical AVR, 2002; b. 10/2018 Echo: Triv AI, mean grad .  . Bradycardia    chronic, no symptoms 07/2010  . C. difficile colitis   . Carotid bruit    dopplers in past, no abnormalities  . Chronic diastolic CHF (congestive heart failure) (HCC)    a. Echo 03/2015: EF 60-65%, no RWMA, GR1DD, mild BAE, mild to mod MR, mod TR, PASP 65 mmHg; b. 01/2017 Echo: EF 55-60%, NRWMA, grade 1 diastolic dysfunction.  Normal functioning prosthetic aortic valve.  Mean gradient 50 mmHg.  Sev TR. PASP ; c. 10/2018 Echo: EF 55-60%, Triv AI, mod dil LA, mod-sev TR, PASP 35-40, mild to mod red RV fxn.  . Coronary artery disease    a. mild, cath, 08/2010; b. medically managed  . Decreased hearing    Right ear  . Depression   . Gastric ulcer   . GERD (gastroesophageal reflux disease)   . Hypertension    BP higher than usual 04/19/10; amlodipine increased by telephone  . Mod-Sev Tricuspid regurgitation    a. 10/2018 Echo: Mod-Sev TR, PASP 35-68mmHg.  Marland Kitchen RLS (restless legs syndrome) 08/23/2015  . S/P AVR    a. St. Jude. mechanical 2002; b. echo 08/2010 EF 60%, trival AI, mild MR, AVR working well; c. on longterm warfarin tx  . SOB (shortness of breath) 10/11   08/2010,Episodes at 5 AM, eventually felt to be anxiety, after complete workup  including catheterization, pt greatly improved with anxiety meds 11/11   Past Surgical History:  Procedure Laterality Date  . CARDIAC CATHETERIZATION    . ESOPHAGOGASTRODUODENOSCOPY N/A 04/05/2015   Procedure: ESOPHAGOGASTRODUODENOSCOPY (EGD);  Surgeon: Scot Jun, MD;  Location: [facility] ENDOSCOPY;  Service: Endoscopy;  Laterality: N/A;  . ESOPHAGOGASTRODUODENOSCOPY N/A 04/17/2015   Procedure: ESOPHAGOGASTRODUODENOSCOPY (EGD);  Surgeon: Scot Jun, MD;  Location: [facility] ENDOSCOPY;  Service: Endoscopy;  Laterality: N/A;  . ESOPHAGOGASTRODUODENOSCOPY N/A 08/02/2015   Procedure: ESOPHAGOGASTRODUODENOSCOPY (EGD);  Surgeon: Wallace Cullens, MD;  Location: [facility] ENDOSCOPY;  Service: Endoscopy;  Laterality: N/A;  . HERNIA REPAIR    . JOINT REPLACEMENT    . TOTAL HIP ARTHROPLASTY    . VALVE REPLACEMENT  1/02   Aortic; echo 3/09 valve working well; echo 10/11 working well; put on Coumadin     No outpatient medications have been marked as taking for the 03/11/19 encounter (Appointment) with Antonieta Iba, MD.     Allergies:  Levofloxacin and Sulfa antibiotics   Social History   Tobacco Use  . Smoking status: Former Smoker    Types: Cigarettes    Last attempt to quit: 1979    Years since quitting: 41.3  . Smokeless tobacco: Never Used  Substance Use Topics  . Alcohol use: Yes    Alcohol/week: 3.0 standard drinks    Types: 3 Glasses of wine per week    Comment: Social  . Drug use: No     Current Outpatient Medications on File Prior to Visit  Medication Sig Dispense Refill  . Cetirizine HCl 10 MG CAPS Take 10 mg by mouth daily.    . citalopram (CELEXA) 20 MG tablet Take 20 mg by mouth daily.    . cloNIDine (CATAPRES) 0.1 MG tablet TAKE 1 TABLET BY MOUTH TWO  TIMES DAILY 180 tablet 2  . doxazosin (CARDURA) 8 MG tablet TAKE 1 TABLET BY MOUTH  TWICE A DAY 180 tablet 0  . enoxaparin (LOVENOX) 100 MG/ML injection Inject 1 mL (100 mg total) into the skin every 12 (twelve) hours. (Patient  not taking: Reported on 02/09/2019) 20 Syringe 1  . finasteride (PROSCAR) 5 MG tablet Take 5 mg by mouth daily.      . furosemide (LASIX) 40 MG tablet Take 1 tablet (40 mg total) by mouth daily. 90 tablet 3  . isosorbide mononitrate (IMDUR) 60 MG 24 hr tablet Take 1 tablet (60 mg total) by mouth 2 (two) times daily. 28 tablet 0  . metoprolol succinate (TOPROL-XL) 25 MG 24 hr tablet Take 0.5 tablets (12.5 mg total) by mouth daily. 90 tablet 3  . potassium chloride (K-DUR) 10 MEQ tablet Take 2 tablets (20 mEq total) by mouth daily. 180 tablet 3  . ranitidine (ZANTAC) 150 MG tablet Take 150 mg by mouth as needed.    Marland Kitchen. rOPINIRole (REQUIP) 2 MG tablet TAKE 1 TABLET BY MOUTH 4  TIMES DAILY 360 tablet 3  . Tamsulosin HCl (FLOMAX) 0.4 MG CAPS Take 0.4 mg by mouth at bedtime.     . torsemide (DEMADEX) 20 MG tablet Take 2 tablets (40 mg total) by mouth daily. 180 tablet 3  . traZODone (DESYREL) 50 MG tablet Take 50 mg by mouth at bedtime.    Marland Kitchen. warfarin (COUMADIN) 5 MG tablet Take 1 tablet daily except 1 and 1/2 tabs on Monday, Wednesday, and Friday or as directed by Coumadin Clinic. 120 tablet 1   No current facility-administered medications on file prior to visit.      Family Hx: The patient's family history includes Diabetes type II in his mother; Heart attack in his mother; Heart disease in his father and mother; Hypertension in his father, mother, and another family member; Stroke in his brother, brother, and father.  ROS:   Please see the history of present illness.    Review of Systems  Constitutional: Negative.   Respiratory: Negative.   Cardiovascular: Positive for leg swelling.  Gastrointestinal: Negative.   Musculoskeletal: Negative.        Gait instability  Neurological: Negative.   Psychiatric/Behavioral: Negative.   All other systems reviewed and are negative.     Labs/Other Tests and Data Reviewed:    Recent Labs: 10/27/2018: B Natriuretic Peptide 467.0 11/24/2018: BUN 16;  Creatinine, Ser 0.97; Hemoglobin 12.3; Platelets 145; Potassium 4.0; Sodium 131   Recent Lipid Panel No results found for: CHOL, TRIG, HDL, CHOLHDL, LDLCALC, LDLDIRECT  Wt Readings from Last 3 Encounters:  02/09/19 220 lb (99.8 kg)  02/08/19  220 lb (99.8 kg)  12/28/18 210 lb (95.3 kg)     Exam:    Vital Signs: Vital signs may also be detailed in the HPI There were no vitals taken for this visit.  Wt Readings from Last 3 Encounters:  02/09/19 220 lb (99.8 kg)  02/08/19 220 lb (99.8 kg)  12/28/18 210 lb (95.3 kg)   Temp Readings from Last 3 Encounters:  02/09/19 97.6 F (36.4 C) (Oral)  12/28/18 97.8 F (36.6 C)  11/24/18 (!) 97.5 F (36.4 C) (Oral)   BP Readings from Last 3 Encounters:  02/09/19 (!) 151/72  02/08/19 (!) 148/70  12/28/18 (!) 184/77   Pulse Readings from Last 3 Encounters:  02/09/19 (!) 50  02/08/19 (!) 57  12/28/18 68     vitasl 130/64, pulse 72, resp 16  Well nourished, well developed male in no acute distress. Constitutional:  oriented to person, place, and time. No distress.    ASSESSMENT & PLAN:    Essential hypertension - Blood pressure is well controlled on today's visit. No changes made to the medications. Stable.  Numbers discussed with him today  Coronary artery disease involving native coronary artery of native heart without angina pectoris Stable, denies any anginal symptoms No further testing at this time  Atrial flutter, typical  On anticoagulation, rate well controlled Asymptomatic, no further work-up  S/P AVR (aortic valve replacement) Well-functioning aortic valve in early 2018 and 2019 Tolerating anticoagulation  SOB (shortness of breath) Dramatic improvement in leg swelling and shortness of breath on torsemide 20 twice daily Appears goal weight 190 pounds Recommend he hold torsemide for weight less than 190 otherwise take torsemide 20 twice daily with potassium twice daily  Lower extremity edema He has  dramatic improvement in his leg swelling over the past month down 15 to 17 pounds We will order complete metabolic panel to evaluate electrolytes and renal function Moderate fluid intake    COVID-19 Education: The signs and symptoms of COVID-19 were discussed with the patient and how to seek care for testing (follow up with PCP or arrange E-visit).  The importance of social distancing was discussed today.  Patient Risk:   After full review of this patients clinical status, I feel that they are at least moderate risk at this time.  Time:   Today, I have spent 25 minutes with the patient with telehealth technology discussing the cardiac and medical problems/diagnoses detailed above   10 min spent reviewing the chart prior to patient visit today   Medication Adjustments/Labs and Tests Ordered: Current medicines are reviewed at length with the patient today.  Concerns regarding medicines are outlined above.   Tests Ordered: No tests ordered   Medication Changes: No changes made   Disposition: Follow-up in 3 months   Signed, Julien Nordmann, MD  03/11/2019 1:49 PM    [facility] [address], [place], [place] [address]

## 2019-03-11 NOTE — Patient Instructions (Addendum)
Medication Instructions:  Please hold the torsemide for weight  <190 pounds, otherwise continue torsemide 20 twice daily   If you need a refill on your cardiac medications before your next appointment, please call your pharmacy.    Lab work: Your provider would like for you to have the following labs: CMET  Judie Grieve, nurse, at [facility] has been made aware to have a CMET completed. Results will be faxed to [phone].   Testing/Procedures: No new testing needed   Follow-Up: At [facility], you and your health needs are our priority.  As part of our continuing mission to provide you with exceptional heart care, we have created designated Provider Care Teams.  These Care Teams include your primary Cardiologist (physician) and Advanced Practice Providers (APPs -  Physician Assistants and Nurse Practitioners) who all work together to provide you with the care you need, when you need it.  . You will need a follow up appointment in 3 months .   Please call our office 2 months in advance to schedule this appointment.    . Providers on your designated Care Team:   . Nicolasa Ducking, NP . Eula Listen, PA-C . [name], PA-C  Any Other Special Instructions Will Be Listed Below (If Applicable).  For educational health videos Log in to : [email or web address] Or : [email or web address], password : [email or web address]

## 2019-03-12 ENCOUNTER — Telehealth: Payer: Medicare Other | Admitting: Cardiovascular Disease

## 2019-03-15 ENCOUNTER — Encounter: Payer: Self-pay | Admitting: Cardiovascular Disease

## 2019-04-09 ENCOUNTER — Telehealth: Payer: Self-pay

## 2019-04-09 NOTE — Telephone Encounter (Signed)
1. Do you currently have a fever? No 2. Have you recently travelled on a cruise, internationally, or to [place], [place], [place], [place], [place], or [place], [place] ([employer]) ? No 3. Have you been in contact with someone that is currently pending confirmation of Covid19 testing or has been confirmed to have the Covid19 virus?  No 4. Are you currently experiencing fatigue or cough? No  Spoke w/ pt. Advised that we are restricting visitors at this time and anyone present in the vehicle should meet the above criteria as well. Advised that visit will be at curbside for finger stick ONLY and will receive call with instructions. Pt also advised to please bring own pen for signature of arrival document.   

## 2019-04-12 ENCOUNTER — Other Ambulatory Visit: Payer: Self-pay

## 2019-04-12 ENCOUNTER — Ambulatory Visit (INDEPENDENT_AMBULATORY_CARE_PROVIDER_SITE_OTHER): Payer: Medicare Other

## 2019-04-12 DIAGNOSIS — Z5181 Encounter for therapeutic drug level monitoring: Secondary | ICD-10-CM | POA: Diagnosis not present

## 2019-04-12 DIAGNOSIS — I350 Nonrheumatic aortic (valve) stenosis: Secondary | ICD-10-CM

## 2019-04-12 DIAGNOSIS — Z952 Presence of prosthetic heart valve: Secondary | ICD-10-CM

## 2019-04-12 DIAGNOSIS — Z7901 Long term (current) use of anticoagulants: Secondary | ICD-10-CM

## 2019-04-12 LAB — POCT INR: INR: 2.7 (ref 2.0–3.0)

## 2019-04-12 NOTE — Patient Instructions (Signed)
Please continue dosage of 1 tablet every day except 1.5 tablets on Mondays, Wednesdays & Fridays.  Recheck in 6 weeks.  

## 2019-04-21 ENCOUNTER — Ambulatory Visit: Payer: Medicare Other | Admitting: Podiatry

## 2019-04-21 ENCOUNTER — Other Ambulatory Visit: Payer: Self-pay

## 2019-04-21 ENCOUNTER — Encounter: Payer: Medicare Other | Attending: Internal Medicine | Admitting: Internal Medicine

## 2019-04-21 DIAGNOSIS — L97811 Non-pressure chronic ulcer of other part of right lower leg limited to breakdown of skin: Secondary | ICD-10-CM | POA: Diagnosis present

## 2019-04-21 DIAGNOSIS — Z87891 Personal history of nicotine dependence: Secondary | ICD-10-CM | POA: Insufficient documentation

## 2019-04-21 DIAGNOSIS — I2510 Atherosclerotic heart disease of native coronary artery without angina pectoris: Secondary | ICD-10-CM | POA: Diagnosis not present

## 2019-04-21 DIAGNOSIS — Z882 Allergy status to sulfonamides status: Secondary | ICD-10-CM | POA: Diagnosis not present

## 2019-04-21 DIAGNOSIS — Z881 Allergy status to other antibiotic agents status: Secondary | ICD-10-CM | POA: Diagnosis not present

## 2019-04-21 DIAGNOSIS — Z952 Presence of prosthetic heart valve: Secondary | ICD-10-CM | POA: Diagnosis not present

## 2019-04-21 DIAGNOSIS — I872 Venous insufficiency (chronic) (peripheral): Secondary | ICD-10-CM | POA: Insufficient documentation

## 2019-04-21 DIAGNOSIS — I87311 Chronic venous hypertension (idiopathic) with ulcer of right lower extremity: Secondary | ICD-10-CM | POA: Insufficient documentation

## 2019-04-21 DIAGNOSIS — Z7901 Long term (current) use of anticoagulants: Secondary | ICD-10-CM | POA: Diagnosis not present

## 2019-04-21 NOTE — Progress Notes (Signed)
[name], [name] ([id number]) Visit Report for 04/21/2019 Abuse/Suicide Risk Screen Details Patient Name: [name], Kairo A. Date of Service: 04/21/2019 1:15 PM Medical Record Number: [id number] Patient Account Number: [id number] Date of Birth/Sex: [birth year] (85 y.o. M) Treating RN: Curtis Sitesorthy, Joanna Primary Care Leslee Haueter: Marcelino DusterJOHNSTON, JOHN Other Clinician: Referring Aidden Markovic: Einar CrowAnderson, Marshall Treating Brian Zeitlin/Extender: Altamese CarolinaOBSON, MICHAEL G Weeks in Treatment: 0 Abuse/Suicide Risk Screen Items Answer ABUSE RISK SCREEN: Has anyone close to you tried to hurt or harm you recentlyo No Do you feel uncomfortable with anyone in your familyo No Has anyone forced you do things that you didnot want to doo No Electronic Signature(s) Signed: 04/21/2019 4:53:50 PM By: Curtis Sitesorthy, Joanna Entered By: Curtis Sitesorthy, Joanna on 04/21/2019 13:05:06 Brook, French A. ([id number]) -------------------------------------------------------------------------------- Activities of Daily Living Details Patient Name: [name], [name] Date of Service: 04/21/2019 1:15 PM Medical Record Number: [id number] Patient Account Number: [id number] Date of Birth/Sex: [birth year] (85 y.o. M) Treating RN: Curtis Sitesorthy, Joanna Primary Care Rajanae Mantia: Marcelino DusterJOHNSTON, JOHN Other Clinician: Referring Kashauna Celmer: Einar CrowAnderson, Marshall Treating Rosangela Fehrenbach/Extender: Altamese CarolinaOBSON, MICHAEL G Weeks in Treatment: 0 Activities of Daily Living Items Answer Activities of Daily Living (Please select one for each item) Drive Automobile Completely Able Take Medications Completely Able Use Telephone Completely Able Care for Appearance Completely Able Use Toilet Completely Able Bath / Shower Completely Able Dress Self Completely Able Feed Self Completely Able Walk Completely Able Get In / Out Bed Completely Able Housework Need Assistance Prepare Meals Need Assistance Handle Money Completely Able Shop for Self Need Assistance Electronic Signature(s) Signed: 04/21/2019 4:53:50 PM  By: Curtis Sitesorthy, Joanna Entered By: Curtis Sitesorthy, Joanna on 04/21/2019 13:05:36 [name], [name] ([id number]) -------------------------------------------------------------------------------- Education Screening Details Patient Name: [name], [name] Date of Service: 04/21/2019 1:15 PM Medical Record Number: [id number] Patient Account Number: [id number] Date of Birth/Sex: [birth year] (85 y.o. M) Treating RN: Curtis Sitesorthy, Joanna Primary Care Yosselin Zoeller: Marcelino DusterJOHNSTON, JOHN Other Clinician: Referring Mykia Holton: Einar CrowAnderson, Marshall Treating Hanadi Stanly/Extender: Altamese CarolinaOBSON, MICHAEL G Weeks in Treatment: 0 Primary Learner Assessed: Patient Learning Preferences/Education Level/Primary Language Learning Preference: Explanation, Demonstration Highest Education Level: College or Above Preferred Language: English Cognitive Barrier Language Barrier: No Translator Needed: No Memory Deficit: No Emotional Barrier: No Cultural/Religious Beliefs Affecting Medical Care: No Physical Barrier Impaired Vision: No Impaired Hearing: No Decreased Hand dexterity: No Knowledge/Comprehension Knowledge Level: Medium Comprehension Level: Medium Ability to understand written Medium instructions: Ability to understand verbal Medium instructions: Motivation Anxiety Level: Calm Cooperation: Cooperative Education Importance: Acknowledges Need Interest in Health Problems: Asks Questions Perception: Coherent Willingness to Engage in Self- Medium Management Activities: Readiness to Engage in Self- Medium Management Activities: Electronic Signature(s) Signed: 04/21/2019 4:53:50 PM By: Curtis Sitesorthy, Joanna Entered By: Curtis Sitesorthy, Joanna on 04/21/2019 13:06:04 [name], [name] ([id number]) -------------------------------------------------------------------------------- Fall Risk Assessment Details Patient Name: [name], [name] Date of Service: 04/21/2019 1:15 PM Medical Record Number: [id number] Patient Account Number: [id number] Date of  Birth/Sex: [birth year] (85 y.o. M) Treating RN: Curtis Sitesorthy, Joanna Primary Care Zionah Criswell: Marcelino DusterJOHNSTON, JOHN Other Clinician: Referring Shamirah Ivan: Einar CrowAnderson, Marshall Treating Keishla Oyer/Extender: Altamese CarolinaOBSON, MICHAEL G Weeks in Treatment: 0 Fall Risk Assessment Items Have you had 2 or more falls in the last 12 monthso 0 No Have you had any fall that resulted in injury in the last 12 monthso 0 No FALLS RISK SCREEN History of falling - immediate or within 3 months 0 No Secondary diagnosis (Do you have 2 or more medical diagnoseso) 0 No Ambulatory aid None/bed rest/wheelchair/nurse 0 No Crutches/cane/walker 15 Yes Furniture 0 No Intravenous therapy Access/Saline/Heparin Lock 0 No Gait/Transferring Normal/ bed rest/ wheelchair 0 No  Weak (short steps with or without shuffle, stooped but able to lift head while 10 Yes walking, may seek support from furniture) Impaired (short steps with shuffle, may have difficulty arising from chair, head 0 No down, impaired balance) Mental Status Oriented to own ability 0 Yes Electronic Signature(s) Signed: 04/21/2019 4:53:50 PM By: Curtis Sites Entered By: Curtis Sites on 04/21/2019 13:06:21 [name] A. ([id number]) -------------------------------------------------------------------------------- Foot Assessment Details Patient Name: [name] A. Date of Service: 04/21/2019 1:15 PM Medical Record Number: [id number] Patient Account Number: [id number] Date of Birth/Sex: 1931/02/18 (85 y.o. M) Treating RN: Curtis Sites Primary Care Tyrie Porzio: [name] Other Clinician: Referring Kolter Reaver: Einar Crow Treating Anaria Kroner/Extender: Altamese [place] in Treatment: 0 Foot Assessment Items Site Locations + = Sensation present, - = Sensation absent, C = Callus, U = Ulcer R = Redness, W = Warmth, M = Maceration, PU = Pre-ulcerative lesion F = Fissure, S = Swelling, D = Dryness Assessment Right: Left: Other Deformity: No No Prior Foot  Ulcer: No No Prior Amputation: No No Charcot Joint: No No Ambulatory Status: Ambulatory With Help Assistance Device: Walker Gait: Steady Electronic Signature(s) Signed: 04/21/2019 4:53:50 PM By: Curtis Sites Entered By: Curtis Sites on 04/21/2019 13:11:09 Flamenco, Jarquavious A. ([id number]) -------------------------------------------------------------------------------- Nutrition Risk Screening Details Patient Name: [name] A. Date of Service: 04/21/2019 1:15 PM Medical Record Number: [id number] Patient Account Number: [id number] Date of Birth/Sex: 1931/04/24 (85 y.o. M) Treating RN: Curtis Sites Primary Care Kiwana Deblasi: [name] Other Clinician: Referring Jnyah Brazee: Einar Crow Treating Ronnisha Felber/Extender: Altamese [place] in Treatment: 0 Height (in): 71 Weight (lbs): 195 Body Mass Index (BMI): 27.2 Nutrition Risk Screening Items Score Screening NUTRITION RISK SCREEN: I have an illness or condition that made me change the kind and/or amount of 0 No food I eat I eat fewer than two meals per day 0 No I eat few fruits and vegetables, or milk products 0 No I have three or more drinks of beer, liquor or wine almost every day 0 No I have tooth or mouth problems that make it hard for me to eat 0 No I don't always have enough money to buy the food I need 0 No I eat alone most of the time 0 No I take three or more different prescribed or over-the-counter drugs a day 1 Yes Without wanting to, I have lost or gained 10 pounds in the last six months 0 No I am not always physically able to shop, cook and/or feed myself 0 No Nutrition Protocols Good Risk Protocol 0 No interventions needed Moderate Risk Protocol High Risk Proctocol Risk Level: Good Risk Score: 1 Electronic Signature(s) Signed: 04/21/2019 4:53:50 PM By: Curtis Sites Entered By: Curtis Sites on 04/21/2019 13:06:30

## 2019-04-28 ENCOUNTER — Encounter: Payer: Medicare Other | Attending: Internal Medicine | Admitting: Internal Medicine

## 2019-04-28 ENCOUNTER — Other Ambulatory Visit: Payer: Self-pay

## 2019-04-28 DIAGNOSIS — L97811 Non-pressure chronic ulcer of other part of right lower leg limited to breakdown of skin: Secondary | ICD-10-CM | POA: Diagnosis not present

## 2019-04-28 DIAGNOSIS — I890 Lymphedema, not elsewhere classified: Secondary | ICD-10-CM | POA: Insufficient documentation

## 2019-04-28 DIAGNOSIS — I87331 Chronic venous hypertension (idiopathic) with ulcer and inflammation of right lower extremity: Secondary | ICD-10-CM | POA: Insufficient documentation

## 2019-04-28 DIAGNOSIS — Z952 Presence of prosthetic heart valve: Secondary | ICD-10-CM | POA: Insufficient documentation

## 2019-04-28 DIAGNOSIS — Z7901 Long term (current) use of anticoagulants: Secondary | ICD-10-CM | POA: Insufficient documentation

## 2019-04-28 DIAGNOSIS — L03115 Cellulitis of right lower limb: Secondary | ICD-10-CM | POA: Insufficient documentation

## 2019-04-28 NOTE — Progress Notes (Signed)
COLBURN, ASPER ([id number]) Visit Report for 04/21/2019 Chief Complaint Document Details Patient Name: [name], [name] Date of Service: 04/21/2019 1:15 PM Medical Record Number: [id number] Patient Account Number: [id number] Date of Birth/Sex: August 17, 1931 (85 y.o. M) Treating RN: Huel Coventry Primary Care Provider: [name] Other Clinician: Referring Provider: Einar Crow Treating Provider/Extender: Altamese [place] in Treatment: 0 Information Obtained from: Patient Chief Complaint 04/21/2019; patient is here for review of wounds x2 on his right lower extremity. Electronic Signature(s) Signed: 04/21/2019 5:01:45 PM By: Baltazar Najjar MD Entered By: Baltazar Najjar on 04/21/2019 14:06:00 [name] Hilts A. ([id number]) -------------------------------------------------------------------------------- Debridement Details Patient Name: [name] Hilts A. Date of Service: 04/21/2019 1:15 PM Medical Record Number: [id number] Patient Account Number: [id number] Date of Birth/Sex: 14-Jul-1931 (85 y.o. M) Treating RN: Huel Coventry Primary Care Provider: [name] Other Clinician: Referring Provider: Einar Crow Treating Provider/Extender: Altamese [place] in Treatment: 0 Debridement Performed for Wound #1 Right,Anterior Lower Leg Assessment: Performed By: Physician Maxwell Caul, MD Debridement Type: Chemical/Enzymatic/Mechanical Agent Used: saline and gauze Severity of Tissue Pre Fat layer exposed Debridement: Level of Consciousness (Pre- Awake and Alert procedure): Pre-procedure Verification/Time Yes - 13:55 Out Taken: Start Time: 13:56 Pain Control: Lidocaine Instrument: Other : saline and gauze Bleeding: Minimum Hemostasis Achieved: Pressure End Time: 13:59 Procedural Pain: 3 Response to Treatment: Procedure was tolerated well Level of Consciousness Awake and Alert (Post-procedure): Post Debridement Measurements of Total Wound Length:  (cm) 1.1 Width: (cm) 1 Depth: (cm) 0.1 Volume: (cm) 0.086 Character of Wound/Ulcer Post Debridement: Stable Severity of Tissue Post Debridement: Fat layer exposed Post Procedure Diagnosis Same as Pre-procedure Electronic Signature(s) Signed: 04/21/2019 5:01:45 PM By: Baltazar Najjar MD Signed: 04/27/2019 5:54:26 PM By: Elliot Gurney, BSN, RN, CWS, [name] RN, BSN Entered By: Baltazar Najjar on 04/21/2019 14:05:17 [name], [name] ([id number]) -------------------------------------------------------------------------------- HPI Details Patient Name: [name] Hilts A. Date of Service: 04/21/2019 1:15 PM Medical Record Number: [id number] Patient Account Number: [id number] Date of Birth/Sex: [birth year] (85 y.o. M) Treating RN: Huel Coventry Primary Care Provider: [name] Other Clinician: Referring Provider: Einar Crow Treating Provider/Extender: Altamese [place] in Treatment: 0 History of Present Illness HPI Description: 04/21/2019 ADMISSION This is an independent 85 year old man who lives in the independent part of [address] of [place]. He has a history of chronic lower extremity edema and wears compression stockings. He states about a month ago he was taking off the one on the right and pulled some skin off accidentally. He has had 2 small wounds on the right anterior and right medial lower extremity. I note looking through [facility] health link that he had multiple venous ultrasounds in 2015 and 16. These were DVT rule outs. He did have a Baker's cyst on the left. He has not not had a previous wound history although he did have a history of cellulitis in his legs. Past medical history; includes aortic stenosis status post mechanical AVR in 2007 on chronic Coumadin, lower extremity edema with a history of cellulitis, history of MRSA. ABIs in our clinic were 1.1 on the right Electronic Signature(s) Signed: 04/21/2019 5:01:45 PM By: Baltazar Najjar MD Entered By: Baltazar Najjar on  04/21/2019 14:07:54 [name] Hilts A. ([id number]) -------------------------------------------------------------------------------- Physical Exam Details Patient Name: [name] Hilts A. Date of Service: 04/21/2019 1:15 PM Medical Record Number: [id number] Patient Account Number: [id number] Date of Birth/Sex: 12-28-1930 (85 y.o. M) Treating RN: Huel Coventry Primary Care Provider: [name] Other Clinician: Referring Provider: Einar Crow Treating Provider/Extender: Altamese [place] in Treatment: 0  Constitutional Patient is hypertensive.. Pulse regular and within target range for patient.Marland Kitchen. Respirations regular, non-labored and within target range.. Temperature is normal and within the target range for the patient.Marland Kitchen. appears in no distress. Eyes Conjunctivae clear. No discharge. Respiratory Respiratory effort is easy and symmetric bilaterally. Rate is normal at rest and on room air.. Bilateral breath sounds are clear and equal in all lobes with no wheezes, rales or rhonchi.. Cardiovascular mechanical S2 no S3. Strong popliteal pulse.. Pedal pulses strong on the right that the dorsalis pedis and posterior tibial. Edema is present in the right lower extremity. Changes of chronic venous insufficiency with hemosiderin deposition some degree of stasis dermatitis. I suspect there is some degree of secondary lymphedema as well. Tightly fibrotic skin in the distal lower leg about the ankle. Lymphatic None palpable in the popliteal area. Integumentary (Hair, Skin) Chronic venous insufficiency with hemosiderin deposition and stasis dermatitis.Marland Kitchen. Psychiatric No evidence of depression, anxiety, or agitation. Calm, cooperative, and communicative. Appropriate interactions and affect.. Notes Wound exam; the patient has 2 open areas in the distal right lower leg. The larger area about a centimeter a centimeter is anterior just medial to the tibia. Using gauze and saline I debrided debrided  the surface of this I did not think this required mechanical debridement. There is a very small area more posterior measuring 0.3 x 0.3. No mechanical debridement is required. Electronic Signature(s) Signed: 04/21/2019 5:01:45 PM By: Baltazar Najjarobson, Michael MD Entered By: Baltazar Najjarobson, Michael on 04/21/2019 14:26:16 [name], [name] ([id number]) -------------------------------------------------------------------------------- Physician Orders Details Patient Name: [name] HiltsSCHENK, Novak A. Date of Service: 04/21/2019 1:15 PM Medical Record Number: [id number] Patient Account Number: [id number] Date of Birth/Sex: [birth year] (85 y.o. M) Treating RN: Huel CoventryWoody, [name] Primary Care Provider: Marcelino DusterJOHNSTON, JOHN Other Clinician: Referring Provider: Einar CrowAnderson, Marshall Treating Provider/Extender: Altamese CarolinaOBSON, MICHAEL G Weeks in Treatment: 0 Verbal / Phone Orders: No Diagnosis Coding Wound Cleansing Wound #1 Right,Anterior Lower Leg o May Shower, gently pat wound dry prior to applying new dressing. - Cast protector to keep wrap dry. Wound #2 Right,Lateral Lower Leg o May Shower, gently pat wound dry prior to applying new dressing. - Cast protector to keep wrap dry. Anesthetic (add to Medication List) Wound #1 Right,Anterior Lower Leg o Topical Lidocaine 4% cream applied to wound bed prior to debridement (In Clinic Only). Wound #2 Right,Lateral Lower Leg o Topical Lidocaine 4% cream applied to wound bed prior to debridement (In Clinic Only). Primary Wound Dressing Wound #1 Right,Anterior Lower Leg o Silver Collagen Wound #2 Right,Lateral Lower Leg o Silver Collagen Secondary Dressing Wound #1 Right,Anterior Lower Leg o Dry Gauze Wound #2 Right,Lateral Lower Leg o Dry Gauze Dressing Change Frequency Wound #1 Right,Anterior Lower Leg o Change Dressing Monday, Wednesday, Friday Wound #2 Right,Lateral Lower Leg o Change Dressing Monday, Wednesday, Friday Follow-up Appointments Wound #1 Right,Anterior Lower  Leg o Return Appointment in 1 week. Wound #2 Right,Lateral Lower Leg o Return Appointment in 1 week. Edema Control Credeur, [name] ([id number]) Wound #1 Right,Anterior Lower Leg o 3 Layer Compression System - Right Lower Extremity Wound #2 Right,Lateral Lower Leg o 3 Layer Compression System - Right Lower Extremity Home Health Wound #1 Right,Anterior Lower Leg o Continue Home Health Visits - [facility] [place] [facility] o Home Health Nurse may visit PRN to address patientos wound care needs. o FACE TO FACE ENCOUNTER: MEDICARE and MEDICAID PATIENTS: I certify that this patient is under my care and that I had a face-to-face encounter that meets the physician face-to-face encounter requirements with this  patient on this date. The encounter with the patient was in whole or in part for the following MEDICAL CONDITION: (primary reason for Home Healthcare) MEDICAL NECESSITY: I certify, that based on my findings, NURSING services are a medically necessary home health service. HOME BOUND STATUS: I certify that my clinical findings support that this patient is homebound (i.e., Due to illness or injury, pt requires aid of supportive devices such as crutches, cane, wheelchairs, walkers, the use of special transportation or the assistance of another person to leave their place of residence. There is a normal inability to leave the home and doing so requires considerable and taxing effort. Other absences are for medical reasons / religious services and are infrequent or of short duration when for other reasons). o If current dressing causes regression in wound condition, may D/C ordered dressing product/s and apply Normal Saline Moist Dressing daily until next Wound Healing Center / Other MD appointment. Notify Wound Healing Center of regression in wound condition at [phone]. o Please direct any NON-WOUND related issues/requests for orders to patient's Primary Care  Physician Wound #2 Right,Lateral Lower Leg o Continue Home Health Visits - [facility] [place] nursing o Home Health Nurse may visit PRN to address patientos wound care needs. o FACE TO FACE ENCOUNTER: MEDICARE and MEDICAID PATIENTS: I certify that this patient is under my care and that I had a face-to-face encounter that meets the physician face-to-face encounter requirements with this patient on this date. The encounter with the patient was in whole or in part for the following MEDICAL CONDITION: (primary reason for Home Healthcare) MEDICAL NECESSITY: I certify, that based on my findings, NURSING services are a medically necessary home health service. HOME BOUND STATUS: I certify that my clinical findings support that this patient is homebound (i.e., Due to illness or injury, pt requires aid of supportive devices such as crutches, cane, wheelchairs, walkers, the use of special transportation or the assistance of another person to leave their place of residence. There is a normal inability to leave the home and doing so requires considerable and taxing effort. Other absences are for medical reasons / religious services and are infrequent or of short duration when for other reasons). o If current dressing causes regression in wound condition, may D/C ordered dressing product/s and apply Normal Saline Moist Dressing daily until next Wound Healing Center / Other MD appointment. Notify Wound Healing Center of regression in wound condition at [phone]. o Please direct any NON-WOUND related issues/requests for orders to patient's Primary Care Physician Electronic Signature(s) Signed: 04/21/2019 5:01:45 PM By: Baltazar Najjar MD Signed: 04/27/2019 5:54:26 PM By: Elliot Gurney, BSN, RN, CWS, [name] RN, BSN Entered By: Elliot Gurney, BSN, RN, CWS, [name] on 04/21/2019 13:58:02 [name] Quails ([id number]) -------------------------------------------------------------------------------- Problem List  Details Patient Name: HANCE, CASPERS A. Date of Service: 04/21/2019 1:15 PM Medical Record Number: [id number] Patient Account Number: [id number] Date of Birth/Sex: [birth year] (85 y.o. M) Treating RN: Huel Coventry Primary Care Provider: [name] Other Clinician: Referring Provider: Einar Crow Treating Provider/Extender: Altamese [place] in Treatment: 0 Active Problems ICD-10 Evaluated Encounter Code Description Active Date Today Diagnosis I87.331 Chronic venous hypertension (idiopathic) with ulcer and 04/21/2019 No Yes inflammation of right lower extremity L97.811 Non-pressure chronic ulcer of other part of right lower leg 04/21/2019 No Yes limited to breakdown of skin Inactive Problems Resolved Problems Electronic Signature(s) Signed: 04/21/2019 5:01:45 PM By: Baltazar Najjar MD Entered By: Baltazar Najjar on 04/21/2019 14:03:49 [name], [name] ([id number]) -------------------------------------------------------------------------------- Progress Note Details Patient  Name: [name], [name] Date of Service: 04/21/2019 1:15 PM Medical Record Number: [id number] Patient Account Number: [id number] Date of Birth/Sex: 31-Oct-1931 (85 y.o. M) Treating RN: Huel Coventry Primary Care Provider: [name] Other Clinician: Referring Provider: Einar Crow Treating Provider/Extender: Altamese [place] in Treatment: 0 Subjective Chief Complaint Information obtained from Patient 04/21/2019; patient is here for review of wounds x2 on his right lower extremity. History of Present Illness (HPI) 04/21/2019 ADMISSION This is an independent 85 year old man who lives in the independent part of [address] of [place]. He has a history of chronic lower extremity edema and wears compression stockings. He states about a month ago he was taking off the one on the right and pulled some skin off accidentally. He has had 2 small wounds on the right anterior and right medial  lower extremity. I note looking through [facility] health link that he had multiple venous ultrasounds in 2015 and 16. These were DVT rule outs. He did have a Baker's cyst on the left. He has not not had a previous wound history although he did have a history of cellulitis in his legs. Past medical history; includes aortic stenosis status post mechanical AVR in 2007 on chronic Coumadin, lower extremity edema with a history of cellulitis, history of MRSA. ABIs in our clinic were 1.1 on the right Patient History Information obtained from Patient. Allergies Levaquin, Sulfa (Sulfonamide Antibiotics) Family History Diabetes - Mother, Heart Disease - Mother,Father, Hypertension - Mother,Father, Stroke - Father,Siblings, No family history of Cancer, Hereditary Spherocytosis, Kidney Disease, Lung Disease, Seizures, Thyroid Problems, Tuberculosis. Social History Former smoker - 1975, Marital Status - Married, Alcohol Use - Moderate, Drug Use - No History, Caffeine Use - Daily. Medical History Eyes Patient has history of Cataracts Denies history of Glaucoma, Optic Neuritis Cardiovascular Patient has history of Coronary Artery Disease, Hypertension Denies history of Angina, Arrhythmia, Congestive Heart Failure, Deep Vein Thrombosis, Hypotension, Myocardial Infarction, Peripheral Arterial Disease, Peripheral Venous Disease, Phlebitis, Vasculitis Gastrointestinal Denies history of Cirrhosis , Colitis, Crohn s, Hepatitis A, Hepatitis B, Hepatitis C [name], [name] ([id number]) Integumentary (Skin) Denies history of History of Burn, History of pressure wounds Musculoskeletal Patient has history of Osteoarthritis Denies history of Gout, Rheumatoid Arthritis, Osteomyelitis Medical And Surgical History Notes Cardiovascular bradycardia, aortic valve replaced 2002, carotid bruits Gastrointestinal hx GI bleed Review of Systems (ROS) Constitutional Symptoms (General Health) Denies complaints or symptoms  of Fatigue, Fever, Chills, Marked Weight Change. Eyes Denies complaints or symptoms of Dry Eyes, Vision Changes, Glasses / Contacts. Ear/Nose/Mouth/Throat Denies complaints or symptoms of Difficult clearing ears, Sinusitis. Hematologic/Lymphatic Denies complaints or symptoms of Bleeding / Clotting Disorders, Human Immunodeficiency Virus. Respiratory Denies complaints or symptoms of Chronic or frequent coughs, Shortness of Breath. Cardiovascular Complains or has symptoms of LE edema. Denies complaints or symptoms of Chest pain. Gastrointestinal Denies complaints or symptoms of Frequent diarrhea, Nausea, Vomiting. Endocrine Denies complaints or symptoms of Hepatitis, Thyroid disease, Polydypsia (Excessive Thirst). Genitourinary Denies complaints or symptoms of Kidney failure/ Dialysis, Incontinence/dribbling. Immunological Denies complaints or symptoms of Hives, Itching. Integumentary (Skin) Complains or has symptoms of Wounds, Swelling. Denies complaints or symptoms of Bleeding or bruising tendency, Breakdown. Musculoskeletal Denies complaints or symptoms of Muscle Pain, Muscle Weakness. Neurologic Denies complaints or symptoms of Numbness/parasthesias, Focal/Weakness. Psychiatric Denies complaints or symptoms of Anxiety, Claustrophobia. Objective Constitutional Patient is hypertensive.. Pulse regular and within target range for patient.Marland Kitchen Respirations regular, non-labored and within target range.. Temperature is normal and within the target range for the patient.Marland Kitchen appears  in no distress. Vitals Time Taken: 1:03 PM, Height: 71 in, Source: Measured, Weight: 195 lbs, Source: Measured, BMI: 27.2, Temperature: 97.7 F, Pulse: 57 bpm, Respiratory Rate: 18 breaths/min, Blood Pressure: 150/48 mmHg. [name], [name] ([id number]) Eyes Conjunctivae clear. No discharge. Respiratory Respiratory effort is easy and symmetric bilaterally. Rate is normal at rest and on room air.. Bilateral  breath sounds are clear and equal in all lobes with no wheezes, rales or rhonchi.. Cardiovascular mechanical S2 no S3. Strong popliteal pulse.. Pedal pulses strong on the right that the dorsalis pedis and posterior tibial. Edema is present in the right lower extremity. Changes of chronic venous insufficiency with hemosiderin deposition some degree of stasis dermatitis. I suspect there is some degree of secondary lymphedema as well. Tightly fibrotic skin in the distal lower leg about the ankle. Lymphatic None palpable in the popliteal area. Psychiatric No evidence of depression, anxiety, or agitation. Calm, cooperative, and communicative. Appropriate interactions and affect.. General Notes: Wound exam; the patient has 2 open areas in the distal right lower leg. The larger area about a centimeter a centimeter is anterior just medial to the tibia. Using gauze and saline I debrided debrided the surface of this I did not think this required mechanical debridement. There is a very small area more posterior measuring 0.3 x 0.3. No mechanical debridement is required. Integumentary (Hair, Skin) Chronic venous insufficiency with hemosiderin deposition and stasis dermatitis.. Wound #1 status is Open. Original cause of wound was Gradually Appeared. The wound is located on the Right,Anterior Lower Leg. The wound measures 1.1cm length x 1cm width x 0.1cm depth; 0.864cm^2 area and 0.086cm^3 volume. There is Fat Layer (Subcutaneous Tissue) Exposed exposed. There is no tunneling or undermining noted. There is a medium amount of serous drainage noted. The wound margin is flat and intact. There is large (67-100%) red, friable granulation within the wound bed. There is a small (1-33%) amount of necrotic tissue within the wound bed including Adherent Slough. Wound #2 status is Open. Original cause of wound was Gradually Appeared. The wound is located on the Right,Lateral Lower Leg. The wound measures 0.3cm length  x 0.3cm width x 0.1cm depth; 0.071cm^2 area and 0.007cm^3 volume. There is Fat Layer (Subcutaneous Tissue) Exposed exposed. There is no tunneling or undermining noted. There is a medium amount of serous drainage noted. The wound margin is flat and intact. There is large (67-100%) red granulation within the wound bed. There is a small (1-33%) amount of necrotic tissue within the wound bed including Adherent Slough. Assessment Active Problems ICD-10 Chronic venous hypertension (idiopathic) with ulcer and inflammation of right lower extremity Non-pressure chronic ulcer of other part of right lower leg limited to breakdown of skin Diagnoses ICD-10 I87.331: Chronic venous hypertension (idiopathic) with ulcer and inflammation of right lower extremity L97.811: Non-pressure chronic ulcer of other part of right lower leg limited to breakdown of skin [name], [name] ([id number]) Procedures Wound #1 Pre-procedure diagnosis of Wound #1 is a Venous Leg Ulcer located on the Right,Anterior Lower Leg .Severity of Tissue Pre Debridement is: Fat layer exposed. There was a Chemical/Enzymatic/Mechanical debridement performed by Maxwell Caul, MD. With the following instrument(s): saline and gauze after achieving pain control using Lidocaine. Other agent used was saline and gauze. A time out was conducted at 13:55, prior to the start of the procedure. A Minimum amount of bleeding was controlled with Pressure. The procedure was tolerated well with a pain level of 3 throughout. Post Debridement Measurements: 1.1cm length x 1cm  width x 0.1cm depth; 0.086cm^3 volume. Character of Wound/Ulcer Post Debridement is stable. Severity of Tissue Post Debridement is: Fat layer exposed. Post procedure Diagnosis Wound #1: Same as Pre-Procedure Plan Wound Cleansing: Wound #1 Right,Anterior Lower Leg: May Shower, gently pat wound dry prior to applying new dressing. - Cast protector to keep wrap dry. Wound #2  Right,Lateral Lower Leg: May Shower, gently pat wound dry prior to applying new dressing. - Cast protector to keep wrap dry. Anesthetic (add to Medication List): Wound #1 Right,Anterior Lower Leg: Topical Lidocaine 4% cream applied to wound bed prior to debridement (In Clinic Only). Wound #2 Right,Lateral Lower Leg: Topical Lidocaine 4% cream applied to wound bed prior to debridement (In Clinic Only). Primary Wound Dressing: Wound #1 Right,Anterior Lower Leg: Silver Collagen Wound #2 Right,Lateral Lower Leg: Silver Collagen Secondary Dressing: Wound #1 Right,Anterior Lower Leg: Dry Gauze Wound #2 Right,Lateral Lower Leg: Dry Gauze Dressing Change Frequency: Wound #1 Right,Anterior Lower Leg: Change Dressing Monday, Wednesday, Friday Wound #2 Right,Lateral Lower Leg: Change Dressing Monday, Wednesday, Friday Follow-up Appointments: Wound #1 Right,Anterior Lower Leg: Return Appointment in 1 week. Wound #2 Right,Lateral Lower Leg: Return Appointment in 1 week. Edema Control: Wound #1 Right,Anterior Lower Leg: 3 Layer Compression System - Right Lower Extremity Wound #2 Right,Lateral Lower Leg: 3 Layer Compression System - Right Lower Extremity Home Health: ABRAHEM, GOREN ([id number]) Wound #1 Right,Anterior Lower Leg: Continue [facility] Visits - [facility] Home Health Nurse may visit PRN to address patient s wound care needs. FACE TO FACE ENCOUNTER: MEDICARE and MEDICAID PATIENTS: I certify that this patient is under my care and that I had a face-to-face encounter that meets the physician face-to-face encounter requirements with this patient on this date. The encounter with the patient was in whole or in part for the following MEDICAL CONDITION: (primary reason for Home Healthcare) MEDICAL NECESSITY: I certify, that based on my findings, NURSING services are a medically necessary home health service. HOME BOUND STATUS: I certify that my clinical findings  support that this patient is homebound (i.e., Due to illness or injury, pt requires aid of supportive devices such as crutches, cane, wheelchairs, walkers, the use of special transportation or the assistance of another person to leave their place of residence. There is a normal inability to leave the home and doing so requires considerable and taxing effort. Other absences are for medical reasons / religious services and are infrequent or of short duration when for other reasons). If current dressing causes regression in wound condition, may D/C ordered dressing product/s and apply Normal Saline Moist Dressing daily until next Wound Healing Center / Other MD appointment. Notify [facility] of regression in wound condition at [phone]. Please direct any NON-WOUND related issues/requests for orders to patient's Primary Care Physician Wound #2 Right,Lateral Lower Leg: Continue Home Health Visits - [facility] nursing Home Health Nurse may visit PRN to address patient s wound care needs. FACE TO FACE ENCOUNTER: MEDICARE and MEDICAID PATIENTS: I certify that this patient is under my care and that I had a face-to-face encounter that meets the physician face-to-face encounter requirements with this patient on this date. The encounter with the patient was in whole or in part for the following MEDICAL CONDITION: (primary reason for Home Healthcare) MEDICAL NECESSITY: I certify, that based on my findings, NURSING services are a medically necessary home health service. HOME BOUND STATUS: I certify that my clinical findings support that this patient is homebound (i.e., Due to  illness or injury, pt requires aid of supportive devices such as crutches, cane, wheelchairs, walkers, the use of special transportation or the assistance of another person to leave their place of residence. There is a normal inability to leave the home and doing so requires considerable and taxing effort. Other  absences are for medical reasons / religious services and are infrequent or of short duration when for other reasons). If current dressing causes regression in wound condition, may D/C ordered dressing product/s and apply Normal Saline Moist Dressing daily until next Wound Healing Center / Other MD appointment. Notify [facility] of regression in wound condition at [phone]. Please direct any NON-WOUND related issues/requests for orders to patient's Primary Care Physician 1 I suspect this patient has chronic venous hypertension with some degree of venous inflammation/stasis dermatitis. Also some degree of secondary lymphedema. The skin in the distal calf is very tightly fibrotic. We will dressed the wound with silver collagen. Patient's nurse at [facility] will change the dressing 2 times and we will see him back next Wednesday. 2. There is no evidence that he has any degree of severe PAD. I used 3 layer compression on him but I think he could tolerate 4 if necessary 3. I saw no evidence of systemic fluid overload at the bedside Electronic Signature(s) Signed: 04/21/2019 5:01:45 PM By: Baltazar Najjar MD Entered By: Baltazar Najjar on 04/21/2019 14:31:00 [name] Hilts A. ([id number]) -------------------------------------------------------------------------------- ROS/PFSH Details Patient Name: [name] Hilts A. Date of Service: 04/21/2019 1:15 PM Medical Record Number: [id number] Patient Account Number: [id number] Date of Birth/Sex: 07/05/1931 (85 y.o. M) Treating RN: Curtis Sites Primary Care Provider: [name] Other Clinician: Referring Provider: Einar Crow Treating Provider/Extender: Altamese [place] in Treatment: 0 Information Obtained From Patient Constitutional Symptoms (General Health) Complaints and Symptoms: Negative for: Fatigue; Fever; Chills; Marked Weight Change Eyes Complaints and Symptoms: Negative for: Dry Eyes; Vision  Changes; Glasses / Contacts Medical History: Positive for: Cataracts Negative for: Glaucoma; Optic Neuritis Ear/Nose/Mouth/Throat Complaints and Symptoms: Negative for: Difficult clearing ears; Sinusitis Hematologic/Lymphatic Complaints and Symptoms: Negative for: Bleeding / Clotting Disorders; Human Immunodeficiency Virus Respiratory Complaints and Symptoms: Negative for: Chronic or frequent coughs; Shortness of Breath Cardiovascular Complaints and Symptoms: Positive for: LE edema Negative for: Chest pain Medical History: Positive for: Coronary Artery Disease; Hypertension Negative for: Angina; Arrhythmia; Congestive Heart Failure; Deep Vein Thrombosis; Hypotension; Myocardial Infarction; Peripheral Arterial Disease; Peripheral Venous Disease; Phlebitis; Vasculitis Past Medical History Notes: bradycardia, aortic valve replaced 2002, carotid bruits Gastrointestinal Complaints and Symptoms: Negative for: Frequent diarrhea; Nausea; Vomiting Medical HistoryRAE, SUTCLIFFE A. ([id number]) Negative for: Cirrhosis ; Colitis; Crohnos; Hepatitis A; Hepatitis B; Hepatitis C Past Medical History Notes: hx GI bleed Endocrine Complaints and Symptoms: Negative for: Hepatitis; Thyroid disease; Polydypsia (Excessive Thirst) Genitourinary Complaints and Symptoms: Negative for: Kidney failure/ Dialysis; Incontinence/dribbling Immunological Complaints and Symptoms: Negative for: Hives; Itching Integumentary (Skin) Complaints and Symptoms: Positive for: Wounds; Swelling Negative for: Bleeding or bruising tendency; Breakdown Medical History: Negative for: History of Burn; History of pressure wounds Musculoskeletal Complaints and Symptoms: Negative for: Muscle Pain; Muscle Weakness Medical History: Positive for: Osteoarthritis Negative for: Gout; Rheumatoid Arthritis; Osteomyelitis Neurologic Complaints and Symptoms: Negative for: Numbness/parasthesias;  Focal/Weakness Psychiatric Complaints and Symptoms: Negative for: Anxiety; Claustrophobia HBO Extended History Items Eyes: Cataracts Immunizations Pneumococcal Vaccine: Received Pneumococcal Vaccination: Yes Immunization Notes: up to date Implantable Devices Berkley, Angelito A. ([id number]) Yes Family and Social History Cancer: No; Diabetes: Yes - Mother; Heart Disease: Yes - Mother,Father; Hereditary  Spherocytosis: No; Hypertension: Yes - Mother,Father; Kidney Disease: No; Lung Disease: No; Seizures: No; Stroke: Yes - Father,Siblings; Thyroid Problems: No; Tuberculosis: No; Former smoker - 1975; Marital Status - Married; Alcohol Use: Moderate; Drug Use: No History; Caffeine Use: Daily; Financial Concerns: No; Food, Clothing or Shelter Needs: No; Support System Lacking: No; Transportation Concerns: No Electronic Signature(s) Signed: 04/21/2019 4:53:50 PM By: Curtis Sites Signed: 04/21/2019 5:01:45 PM By: Baltazar Najjar MD Entered By: Curtis Sites on 04/21/2019 13:20:05 [name] Hilts A. ([id number]) -------------------------------------------------------------------------------- SuperBill Details Patient Name: [name] Hilts A. Date of Service: 04/21/2019 Medical Record Number: [id number] Patient Account Number: [id number] Date of Birth/Sex: 08/06/1931 (85 y.o. M) Treating RN: Huel Coventry Primary Care Provider: [name] Other Clinician: Referring Provider: Einar Crow Treating Provider/Extender: Altamese [place] in Treatment: 0 Diagnosis Coding ICD-10 Codes Code Description [phone] Chronic venous hypertension (idiopathic) with ulcer and inflammation of right lower extremity L97.811 Non-pressure chronic ulcer of other part of right lower leg limited to breakdown of skin Facility Procedures CPT4 Code Description: [id number] - WOUND CARE VISIT-LEV 3 EST PT Modifier: Quantity: 1 CPT4 Code Description: [id number] (Facility Use Only) [id number] - APPLY MULTLAY  COMPRS LWR RT LEG Modifier: Quantity: 1 Physician Procedures CPT4: Description Modifier Quantity Code [id number] - WC PHYS LEVEL 4 - NEW PT 1 ICD-10 Diagnosis Description I87.331 Chronic venous hypertension (idiopathic) with ulcer and inflammation of right lower extremity L97.811 Non-pressure chronic ulcer of  other part of right lower leg limited to breakdown of skin Electronic Signature(s) Signed: 04/21/2019 5:01:45 PM By: Baltazar Najjar MD Entered By: Baltazar Najjar on 04/21/2019 14:31:11

## 2019-04-28 NOTE — Progress Notes (Signed)
[name], [name] ([id number]) Visit Report for 04/21/2019 Allergy List Details Patient Name: [name], [name] Date of Service: 04/21/2019 1:15 PM Medical Record Number: [id number] Patient Account Number: [id number] Date of Birth/Sex: [birth year] (85 y.o. M) Treating RN: [name] Primary Care [name]: [name] Other Clinician: Referring [name]: [name] Treating [name]/Extender: [name] in Treatment: 0 Allergies Active Allergies Levaquin Sulfa (Sulfonamide Antibiotics) Allergy Notes Electronic Signature(s) Signed: 04/21/2019 4:53:50 PM By: [name] Entered By: [name] on 04/21/2019 13:49:47 [name], [name] ([id number]) -------------------------------------------------------------------------------- Arrival Information Details Patient Name: [name] Hilts A. Date of Service: 04/21/2019 1:15 PM Medical Record Number: [id number] Patient Account Number: [id number] Date of Birth/Sex: 29-May-1931 (85 y.o. M) Treating RN: [name] Primary Care Fern Canova: [name] Other Clinician: Referring Jemma Rasp: [name] Treating [name]/Extender: [name] in Treatment: 0 Visit Information Patient Arrived: Walker Arrival Time: 12:59 Accompanied By: self Transfer Assistance: None Patient Identification Verified: Yes Secondary Verification Process Yes Completed: Patient Has Alerts: Yes Patient Alerts: Patient on Blood Thinner Warfarin Electronic Signature(s) Signed: 04/21/2019 4:53:50 PM By: [name] Entered By: [name] on 04/21/2019 13:00:27 [name] Hilts A. ([id number]) -------------------------------------------------------------------------------- Clinic Level of Care Assessment Details Patient Name: [name] Hilts A. Date of Service: 04/21/2019 1:15 PM Medical Record Number: [id number] Patient Account Number: [id number] Date of Birth/Sex: [birth year] (85 y.o. M) Treating RN: [name] Primary Care Darcelle [name]: [name] Other Clinician: Referring [name]: [name] Treating Berdell Nevitt/Extender: [name] in Treatment: 0 Clinic Level of Care Assessment Items TOOL 1 Quantity Score  - Use when EandM and Procedure is performed on INITIAL visit 0 ASSESSMENTS - Nursing Assessment / Reassessment X - General Physical Exam (combine w/ comprehensive assessment (listed just below) when 1 20 performed on new pt. evals) X- 1 25 Comprehensive Assessment (HX, ROS, Risk Assessments, Wounds Hx, etc.) ASSESSMENTS - Wound and Skin Assessment / Reassessment  - Dermatologic / Skin Assessment (not related to wound area) 0 ASSESSMENTS - Ostomy and/or Continence Assessment and Care  - Incontinence Assessment and Management 0  - 0 Ostomy Care Assessment and Management (repouching, etc.) PROCESS - Coordination of Care  - Simple Patient / Family Education for ongoing care 0 X- 1 20 Complex (extensive) Patient / Family Education for ongoing care X- 1 10 Staff obtains Chiropractor, Records, Test Results / Process Orders  - 0 Staff telephones HHA, Nursing Homes / Clarify orders / etc  - 0 Routine Transfer to another Facility (non-emergent condition)  - 0 Routine Hospital Admission (non-emergent condition) X- 1 15 New Admissions / Manufacturing engineer / Ordering NPWT, Apligraf, etc.  - 0 Emergency Hospital Admission (emergent condition) PROCESS - Special Needs  - Pediatric / Minor Patient Management 0  - 0 Isolation Patient Management  - 0 Hearing / Language / Visual special needs  - 0 Assessment of Community assistance (transportation, D/C planning, etc.)  - 0 Additional assistance / Altered mentation  - 0 Support Surface(s) Assessment (bed, cushion, seat, etc.) [name], [name] ([id number]) INTERVENTIONS - Miscellaneous  - External ear exam 0  - 0 Patient Transfer (multiple staff / Nurse, adult / Similar  devices)  - 0 Simple Staple / Suture removal (25 or less)  - 0 Complex Staple / Suture removal (26 or more)  - 0 Hypo/Hyperglycemic Management (do not check if billed separately) X- 1 15 Ankle / Brachial Index (ABI) - do not check if billed separately Has the patient been seen at the hospital within the last three years: Yes  Total Score: 105 Level Of Care: New/Established - Level 3 Electronic Signature(s) Signed: 04/27/2019 5:54:26 PM By: [name], BSN, RN, CWS, [name] RN, BSN Entered By: [name], BSN, RN, CWS, [name] on 04/21/2019 13:58:49 [name] ([id number]) -------------------------------------------------------------------------------- Lower Extremity Assessment Details Patient Name: [name] Hilts A. Date of Service: 04/21/2019 1:15 PM Medical Record Number: [id number] Patient Account Number: [id number] Date of Birth/Sex: May 18, 1931 (85 y.o. M) Treating RN: [name] Primary Care Edger Husain: [name] Other Clinician: Referring [name]: [name] Treating [name]: [name] in Treatment: 0 Edema Assessment Assessed: [Left: No] [Right: No] Edema: [Left: Ye] [Right: s] Calf Left: Right: Point of Measurement: 36 cm From Medial Instep cm 41.5 cm Ankle Left: Right: Point of Measurement: 12 cm From Medial Instep cm 24.5 cm Vascular Assessment Pulses: Dorsalis Pedis Palpable: [Right:Yes] Doppler Audible: [Right:Yes] Posterior Tibial Palpable: [Right:Yes] Doppler Audible: [Right:Yes] Blood Pressure: Brachial: [Right:134] Dorsalis Pedis: 112 Ankle: Posterior Tibial: 148 Ankle Brachial Index: [Right:1.10] Electronic Signature(s) Signed: 04/21/2019 4:53:50 PM By: [name] Entered By: [name] on 04/21/2019 13:31:17 [name], [name] ([id number]) -------------------------------------------------------------------------------- Multi Wound Chart Details Patient Name: [name] Hilts A. Date of Service: 04/21/2019 1:15  PM Medical Record Number: [id number] Patient Account Number: [id number] Date of Birth/Sex: [birth year] (85 y.o. M) Treating RN: [name] Primary Care Karisa Nesser: [name] Other Clinician: Referring Fionn Stracke: [name] Treating Jessalynn Mccowan/Extender: [name] Omao in Treatment: 0 Vital Signs Height(in): 71 Pulse(bpm): 57 Weight(lbs): 195 Blood Pressure(mmHg): 150/48 Body Mass Index(BMI): 27 Temperature(F): 97.7 Respiratory Rate 18 (breaths/min): Photos: [N/A:N/A] Wound Location: Right Lower Leg - Anterior Right Lower Leg - Lateral N/A Wounding Event: Gradually Appeared Gradually Appeared N/A Primary Etiology: Venous Leg Ulcer Venous Leg Ulcer N/A Comorbid History: Cataracts, Coronary Artery Cataracts, Coronary Artery N/A Disease, Hypertension, Disease, Hypertension, Osteoarthritis Osteoarthritis Date Acquired: 03/22/2019 03/22/2019 N/A Weeks of Treatment: 0 0 N/A Wound Status: Open Open N/A Measurements L x W x D 1.1x1x0.1 0.3x0.3x0.1 N/A (cm) Area (cm) : 0.864 0.071 N/A Volume (cm) : 0.086 0.007 N/A Classification: Full Thickness Without Full Thickness Without N/A Exposed Support Structures Exposed Support Structures Exudate Amount: Medium Medium N/A Exudate Type: Serous Serous N/A Exudate Color: amber amber N/A Wound Margin: Flat and Intact Flat and Intact N/A Granulation Amount: Large (67-100%) Large (67-100%) N/A Granulation Quality: Red, Friable Red N/A Necrotic Amount: Small (1-33%) Small (1-33%) N/A Exposed Structures: Fat Layer (Subcutaneous Fat Layer (Subcutaneous N/A Tissue) Exposed: Yes Tissue) Exposed: Yes Fascia: No Fascia: No Tendon: No Tendon: No Muscle: No Muscle: No Joint: No Joint: No Bone: No Bone: No [name], [name] ([id number]) Epithelialization: None None N/A Debridement: Chemical/Enzymatic/Mechanical N/A N/A Pre-procedure 13:55 N/A N/A Verification/Time Out Taken: Pain Control: Lidocaine N/A N/A Instrument:  Other(saline and gauze) N/A N/A Bleeding: Minimum N/A N/A Hemostasis Achieved: Pressure N/A N/A Procedural Pain: 3 N/A N/A Debridement Treatment Procedure was tolerated well N/A N/A Response: Post Debridement 1.1x1x0.1 N/A N/A Measurements L x W x D (cm) Post Debridement Volume: 0.086 N/A N/A (cm) Procedures Performed: Debridement N/A N/A Treatment Notes Electronic Signature(s) Signed: 04/21/2019 5:01:45 PM By: Baltazar Najjar MD Entered By: Baltazar Najjar on 04/21/2019 14:05:03 [name] Hilts A. ([id number]) -------------------------------------------------------------------------------- Multi-Disciplinary Care Plan Details Patient Name: [name] Hilts A. Date of Service: 04/21/2019 1:15 PM Medical Record Number: [id number] Patient Account Number: [id number] Date of Birth/Sex: Apr 23, 1931 (85 y.o. M) Treating RN: [name] Primary Care Henson Fraticelli: [name] Other Clinician: Referring Salima Rumer: [name] Treating Aneliese Beaudry/Extender: [name] [place] in Treatment: 0 Active Inactive Abuse / Safety /  Falls / Self Care Management Nursing Diagnoses: Potential for falls Goals: Patient will not experience any injury related to falls Date Initiated: 04/21/2019 Target Resolution Date: 04/21/2019 Goal Status: Active Interventions: Podiatry chair, stretcher in low position and side rails up as needed Notes: Venous Leg Ulcer Nursing Diagnoses: Knowledge deficit related to disease process and management Goals: Verify adequate tissue perfusion prior to therapeutic compression application Date Initiated: 04/21/2019 Target Resolution Date: 04/21/2019 Goal Status: Active Interventions: Provide education on venous insufficiency Treatment Activities: Therapeutic compression applied : 04/21/2019 Notes: Wound/Skin Impairment Nursing Diagnoses: Impaired tissue integrity Goals: Ulcer/skin breakdown will have a volume reduction of 30% by week 4 Date Initiated:  04/21/2019 Target Resolution Date: 05/22/2019 Goal Status: Active [name], [name] ([id number]) Interventions: Assess ulceration(s) every visit Treatment Activities: Skin care regimen initiated : 04/21/2019 Notes: Electronic Signature(s) Signed: 04/27/2019 5:54:26 PM By: [name] GurneyWoody, BSN, RN, CWS, [name] RN, BSN Entered By: [name] GurneyWoody, BSN, RN, CWS, [name] on 04/21/2019 13:54:42 [name], [name] ([id number]) -------------------------------------------------------------------------------- Pain Assessment Details Patient Name: [name], [name] Date of Service: 04/21/2019 1:15 PM Medical Record Number: [id number] Patient Account Number: [id number] Date of Birth/Sex: Dec 02, 1930 (85 y.o. M) Treating RN: [name] Sitesorthy, Joanna Primary Care Nylen Creque: [name] DusterJOHNSTON, JOHN Other Clinician: Referring Grey Rakestraw: [name] CrowAnderson, Marshall Treating Thadius Smisek/Extender: [name] CarolinaOBSON, MICHAEL G Weeks in Treatment: 0 Active Problems Location of Pain Severity and Description of Pain Patient Has Paino Yes Site Locations Pain Location: Pain in Ulcers With Dressing Change: Yes Duration of the Pain. Constant / Intermittento Intermittent Pain Management and Medication Current Pain Management: Electronic Signature(s) Signed: 04/21/2019 4:53:50 PM By: [name] Sitesorthy, Joanna Entered By: [name] Sitesorthy, Joanna on 04/21/2019 13:02:38 [name], Shawndale A. ([id number]) -------------------------------------------------------------------------------- Patient/Caregiver Education Details Patient Name: [name], [name] Date of Service: 04/21/2019 1:15 PM Medical Record Number: [id number] Patient Account Number: [id number] Date of Birth/Gender: Dec 02, 1930 (85 y.o. M) Treating RN: [name] CoventryWoody, [name] Primary Care Physician: [name] DusterJOHNSTON, JOHN Other Clinician: Referring Physician: Einar CrowAnderson, Marshall Treating Physician/Extender: [name] CarolinaOBSON, MICHAEL G Weeks in Treatment: 0 Education Assessment Education Provided To: Patient Education Topics Provided Venous: Handouts: Controlling Swelling  with Multilayered Compression Wraps Methods: Demonstration, Explain/Verbal Responses: State content correctly Wound/Skin Impairment: Handouts: Caring for Your Ulcer Methods: Demonstration, Explain/Verbal Responses: State content correctly Electronic Signature(s) Signed: 04/27/2019 5:54:26 PM By: [name] GurneyWoody, BSN, RN, CWS, [name] RN, BSN Entered By: [name] GurneyWoody, BSN, RN, CWS, [name] on 04/21/2019 13:59:34 [name], [name] ([id number]) -------------------------------------------------------------------------------- Wound Assessment Details Patient Name: [name], Jamarien A. Date of Service: 04/21/2019 1:15 PM Medical Record Number: [id number] Patient Account Number: [id number] Date of Birth/Sex: Dec 02, 1930 (85 y.o. M) Treating RN: [name] Sitesorthy, Joanna Primary Care Goerge Mohr: [name] DusterJOHNSTON, JOHN Other Clinician: Referring Jarae Panas: [name] CrowAnderson, Marshall Treating Latham Kinzler/Extender: [name] CarolinaOBSON, MICHAEL G Weeks in Treatment: 0 Wound Status Wound Number: 1 Primary Venous Leg Ulcer Etiology: Wound Location: Right Lower Leg - Anterior Wound Status: Open Wounding Event: Gradually Appeared Comorbid Cataracts, Coronary Artery Disease, Date Acquired: 03/22/2019 History: Hypertension, Osteoarthritis Weeks Of Treatment: 0 Clustered Wound: No Photos Wound Measurements Length: (cm) 1.1 Width: (cm) 1 Depth: (cm) 0.1 Area: (cm) 0.864 Volume: (cm) 0.086 % Reduction in Area: % Reduction in Volume: Epithelialization: None Tunneling: No Undermining: No Wound Description Full Thickness Without Exposed Support Classification: Structures Wound Margin: Flat and Intact Exudate Medium Amount: Exudate Type: Serous Exudate Color: amber Foul Odor After Cleansing: No Slough/Fibrino Yes Wound Bed Granulation Amount: Large (67-100%) Exposed Structure Granulation Quality: Red, Friable Fascia Exposed: No Necrotic Amount: Small (1-33%) Fat Layer (Subcutaneous Tissue) Exposed: Yes Necrotic Quality: Adherent Slough Tendon Exposed:  No Muscle Exposed: No Joint Exposed: No  Bone Exposed: No Tan, Kamin A. ([id number]) Treatment Notes Wound #1 (Right, Anterior Lower Leg) Notes prisma, abd and 3 ayer wrap Electronic Signature(s) Signed: 04/21/2019 4:53:50 PM By: [name] Entered By: [name] on 04/21/2019 13:23:29 Deike, Mukund A. ([id number]) -------------------------------------------------------------------------------- Wound Assessment Details Patient Name: [name] Hilts A. Date of Service: 04/21/2019 1:15 PM Medical Record Number: [id number] Patient Account Number: [id number] Date of Birth/Sex: 02/14/1931 (85 y.o. M) Treating RN: [name] Primary Care Sherlie Boyum: [name] Other Clinician: Referring Katia Hannen: [name] Treating Lorilee Cafarella/Extender: [name] [place] in Treatment: 0 Wound Status Wound Number: 2 Primary Venous Leg Ulcer Etiology: Wound Location: Right Lower Leg - Lateral Wound Status: Open Wounding Event: Gradually Appeared Comorbid Cataracts, Coronary Artery Disease, Date Acquired: 03/22/2019 History: Hypertension, Osteoarthritis Weeks Of Treatment: 0 Clustered Wound: No Photos Wound Measurements Length: (cm) 0.3 Width: (cm) 0.3 Depth: (cm) 0.1 Area: (cm) 0.071 Volume: (cm) 0.007 % Reduction in Area: % Reduction in Volume: Epithelialization: None Tunneling: No Undermining: No Wound Description Full Thickness Without Exposed Support Classification: Structures Wound Margin: Flat and Intact Exudate Medium Amount: Exudate Type: Serous Exudate Color: amber Foul Odor After Cleansing: No Slough/Fibrino Yes Wound Bed Granulation Amount: Large (67-100%) Exposed Structure Granulation Quality: Red Fascia Exposed: No Necrotic Amount: Small (1-33%) Fat Layer (Subcutaneous Tissue) Exposed: Yes Necrotic Quality: Adherent Slough Tendon Exposed: No Muscle Exposed: No Joint Exposed: No Bone Exposed: No Hitz, Muhammed A.  ([id number]) Treatment Notes Wound #2 (Right, Lateral Lower Leg) Notes prisma, abd and 3 ayer wrap Electronic Signature(s) Signed: 04/21/2019 4:53:50 PM By: [name] Entered By: [name] on 04/21/2019 13:24:29 [name] Hilts A. ([id number]) -------------------------------------------------------------------------------- Vitals Details Patient Name: [name] Hilts A. Date of Service: 04/21/2019 1:15 PM Medical Record Number: [id number] Patient Account Number: [id number] Date of Birth/Sex: 07-12-1931 (85 y.o. M) Treating RN: [name] Primary Care Braydn Carneiro: [name] Other Clinician: Referring Erikah Thumm: [name] Treating Kylynn Street/Extender: [name] [place] in Treatment: 0 Vital Signs Time Taken: 13:03 Temperature (F): 97.7 Height (in): 71 Pulse (bpm): 57 Source: Measured Respiratory Rate (breaths/min): 18 Weight (lbs): 195 Blood Pressure (mmHg): 150/48 Source: Measured Reference Range: 80 - 120 mg / dl Body Mass Index (BMI): 27.2 Electronic Signature(s) Signed: 04/21/2019 4:53:50 PM By: [name] Entered By: [name] on 04/21/2019 13:04:42

## 2019-04-28 NOTE — Progress Notes (Signed)
[name], [name] ([id number]) Visit Report for 04/28/2019 Arrival Information Details Patient Name: [name], [name] Date of Service: 04/28/2019 1:45 PM Medical Record Number: [id number] Patient Account Number: [id number] Date of Birth/Sex: 12/02/1930 (85 y.o. M) Treating RN: Huel Coventry Primary Care Vannary Greening: [name] Other Clinician: Referring Kenyah Luba: [name] Treating Lavelle Akel/Extender: Altamese [place] in Treatment: 1 Visit Information History Since Last Visit Added or deleted any medications: No Patient Arrived: Walker Any new allergies or adverse reactions: No Arrival Time: 13:53 Had a fall or experienced change in No Accompanied By: self activities of daily living that may affect Transfer Assistance: None risk of falls: Patient Identification Verified: Yes Signs or symptoms of abuse/neglect since last visito No Secondary Verification Process Yes Hospitalized since last visit: No Completed: Implantable device outside of the clinic excluding No Patient Has Alerts: Yes cellular tissue based products placed in the center Patient Alerts: Patient on Blood since last visit: Thinner Has Dressing in Place as Prescribed: Yes Warfarin Has Compression in Place as Prescribed: Yes Pain Present Now: No Electronic Signature(s) Signed: 04/28/2019 4:50:33 PM By: Elliot Gurney, BSN, RN, CWS, [name] RN, BSN Entered By: Elliot Gurney, BSN, RN, CWS, [name] on 04/28/2019 13:53:51 [name] Hilts A. ([id number]) -------------------------------------------------------------------------------- Compression Therapy Details Patient Name: [name] Hilts A. Date of Service: 04/28/2019 1:45 PM Medical Record Number: [id number] Patient Account Number: [id number] Date of Birth/Sex: 1931/05/07 (85 y.o. M) Treating RN: Huel Coventry Primary Care Jakolby Sedivy: [name] Other Clinician: Referring Emmajean Ratledge: [name] Treating Olanda Boughner/Extender: Altamese [place] in Treatment: 1 Compression Therapy  Performed for Wound Assessment: Wound #1 Right,Anterior Lower Leg Performed By: Clinician Huel Coventry, RN Compression Type: Three Layer Pre Treatment ABI: 1.1 Post Procedure Diagnosis Same as Pre-procedure Electronic Signature(s) Signed: 04/28/2019 4:50:33 PM By: Elliot Gurney, BSN, RN, CWS, [name] RN, BSN Entered By: Elliot Gurney, BSN, RN, CWS, [name] on 04/28/2019 14:10:46 [name] ([id number]) -------------------------------------------------------------------------------- Encounter Discharge Information Details Patient Name: [name] Hilts A. Date of Service: 04/28/2019 1:45 PM Medical Record Number: [id number] Patient Account Number: [id number] Date of Birth/Sex: 16-May-1931 (85 y.o. M) Treating RN: Huel Coventry Primary Care Surafel Hilleary: [name] Other Clinician: Referring Kaylon Laroche: [name] Treating Diyana Starrett/Extender: Altamese [place] in Treatment: 1 Encounter Discharge Information Items Discharge Condition: Stable Ambulatory Status: Walker Discharge Destination: Home Transportation: Private Auto Accompanied By: self Schedule Follow-up Appointment: Yes Clinical Summary of Care: Electronic Signature(s) Signed: 04/28/2019 4:50:33 PM By: Elliot Gurney, BSN, RN, CWS, [name] RN, BSN Entered By: Elliot Gurney, BSN, RN, CWS, [name] on 04/28/2019 14:23:09 [name] ([id number]) -------------------------------------------------------------------------------- Lower Extremity Assessment Details Patient Name: [name] Hilts A. Date of Service: 04/28/2019 1:45 PM Medical Record Number: [id number] Patient Account Number: [id number] Date of Birth/Sex: [birth year] (85 y.o. M) Treating RN: Huel Coventry Primary Care Zuley Lutter: [name] Other Clinician: Referring Lanell Dubie: [name] Treating Jeslynn Hollander/Extender: Altamese [place] in Treatment: 1 Edema Assessment Assessed: [Left: No] [Right: No] [Left: Edema] [Right: :] Calf Left: Right: Point of Measurement: 36 cm From Medial Instep cm 36.5  cm Ankle Left: Right: Point of Measurement: 12 cm From Medial Instep cm 22.5 cm Vascular Assessment Pulses: Dorsalis Pedis Palpable: [Right:Yes] Electronic Signature(s) Signed: 04/28/2019 4:50:33 PM By: Elliot Gurney, BSN, RN, CWS, [name] RN, BSN Entered By: Elliot Gurney, BSN, RN, CWS, [name] on 04/28/2019 14:04:12 [name] Hilts A. ([id number]) -------------------------------------------------------------------------------- Multi Wound Chart Details Patient Name: [name] Hilts A. Date of Service: 04/28/2019 1:45 PM Medical Record Number: [id number] Patient Account Number: [id number] Date of Birth/Sex: February 08, 1931 (85 y.o. M) Treating RN: Huel Coventry Primary Care  Saffron Busey: [name] Other Clinician: Referring Tassie Pollett: [name] Treating Charnetta Wulff/Extender: Altamese [place] in Treatment: 1 Vital Signs Height(in): 71 Pulse(bpm): 58 Weight(lbs): 195 Blood Pressure(mmHg): 135/61 Body Mass Index(BMI): 27 Temperature(F): 97.6 Respiratory Rate 18 (breaths/min): Photos: [N/A:N/A] Wound Location: Right Lower Leg - Anterior Right Lower Leg - Lateral N/A Wounding Event: Gradually Appeared Gradually Appeared N/A Primary Etiology: Venous Leg Ulcer Venous Leg Ulcer N/A Comorbid History: Cataracts, Coronary Artery Cataracts, Coronary Artery N/A Disease, Hypertension, Disease, Hypertension, Osteoarthritis Osteoarthritis Date Acquired: 03/22/2019 03/22/2019 N/A Weeks of Treatment: 1 1 N/A Wound Status: Open Healed - Epithelialized N/A Measurements L x W x D 1.1x1x0.1 0x0x0 N/A (cm) Area (cm) : 0.864 0 N/A Volume (cm) : 0.086 0 N/A % Reduction in Area: 0.00% 100.00% N/A % Reduction in Volume: 0.00% 100.00% N/A Classification: Full Thickness Without Full Thickness Without N/A Exposed Support Structures Exposed Support Structures Exudate Amount: Medium N/A N/A Exudate Type: Serous N/A N/A Exudate Color: amber N/A N/A Wound Margin: Flat and Intact N/A N/A Granulation Amount: Large  (67-100%) None Present (0%) N/A Granulation Quality: Red, Friable N/A N/A Necrotic Amount: Small (1-33%) None Present (0%) N/A Exposed Structures: Fat Layer (Subcutaneous N/A N/A Tissue) Exposed: Yes Fascia: No Tendon: No Muscle: No Barot, Jamarrion A. ([id number]) Joint: No Bone: No Epithelialization: None Large (67-100%) N/A Procedures Performed: Compression Therapy N/A N/A Treatment Notes Electronic Signature(s) Signed: 04/28/2019 4:12:25 PM By: Baltazar Najjar MD Entered By: Baltazar Najjar on 04/28/2019 14:20:30 [name] Hilts A. ([id number]) -------------------------------------------------------------------------------- Multi-Disciplinary Care Plan Details Patient Name: [name] Hilts A. Date of Service: 04/28/2019 1:45 PM Medical Record Number: [id number] Patient Account Number: [id number] Date of Birth/Sex: 1930/12/05 (85 y.o. M) Treating RN: Huel Coventry Primary Care Ruthann Angulo: [name] Other Clinician: Referring Eagle Pitta: [name] Treating Kellen Hover/Extender: Altamese [place] in Treatment: 1 Active Inactive Abuse / Safety / Falls / Self Care Management Nursing Diagnoses: Potential for falls Goals: Patient will not experience any injury related to falls Date Initiated: 04/21/2019 Target Resolution Date: 04/21/2019 Goal Status: Active Interventions: Podiatry chair, stretcher in low position and side rails up as needed Notes: Venous Leg Ulcer Nursing Diagnoses: Knowledge deficit related to disease process and management Goals: Verify adequate tissue perfusion prior to therapeutic compression application Date Initiated: 04/21/2019 Target Resolution Date: 04/21/2019 Goal Status: Active Interventions: Provide education on venous insufficiency Treatment Activities: Therapeutic compression applied : 04/21/2019 Notes: Wound/Skin Impairment Nursing Diagnoses: Impaired tissue integrity Goals: Ulcer/skin breakdown will have a volume reduction of 30% by week  4 Date Initiated: 04/21/2019 Target Resolution Date: 05/22/2019 Goal Status: Active [name], [name] ([id number]) Interventions: Assess ulceration(s) every visit Treatment Activities: Skin care regimen initiated : 04/21/2019 Notes: Electronic Signature(s) Signed: 04/28/2019 4:50:33 PM By: Elliot Gurney, BSN, RN, CWS, [name] RN, BSN Entered By: Elliot Gurney, BSN, RN, CWS, [name] on 04/28/2019 14:04:20 [name] Hilts A. ([id number]) -------------------------------------------------------------------------------- Pain Assessment Details Patient Name: [name] Hilts A. Date of Service: 04/28/2019 1:45 PM Medical Record Number: [id number] Patient Account Number: [id number] Date of Birth/Sex: 1931/10/17 (85 y.o. M) Treating RN: Huel Coventry Primary Care Jaydeen Darley: [name] Other Clinician: Referring Naylani Bradner: [name] Treating Tamarra Geiselman/Extender: Altamese [place] in Treatment: 1 Active Problems Location of Pain Severity and Description of Pain Patient Has Paino No Site Locations Pain Management and Medication Current Pain Management: Notes Patient states wound are tender. Electronic Signature(s) Signed: 04/28/2019 4:50:33 PM By: Elliot Gurney, BSN, RN, CWS, [name] RN, BSN Entered By: Elliot Gurney, BSN, RN, CWS, [name] on 04/28/2019 13:54:10 [name] ([id number]) -------------------------------------------------------------------------------- Patient/Caregiver Education Details  Patient Name: [name], [name] Date of Service: 04/28/2019 1:45 PM Medical Record Number: [id number] Patient Account Number: [id number] Date of Birth/Gender: Feb 03, 1931 (85 y.o. M) Treating RN: Huel CoventryWoody, [name] Primary Care Physician: Marcelino DusterJOHNSTON, JOHN Other Clinician: Referring Physician: Marcelino DusterJOHNSTON, JOHN Treating Physician/Extender: Altamese CarolinaOBSON, MICHAEL G Weeks in Treatment: 1 Education Assessment Education Provided To: Patient Education Topics Provided Infection: Handouts: Infection Prevention and Management, Other: take antibiotics as  prescribed Methods: Demonstration Responses: State content correctly Wound/Skin Impairment: Handouts: Caring for Your Ulcer Methods: Demonstration, Explain/Verbal Responses: State content correctly Electronic Signature(s) Signed: 04/28/2019 4:50:33 PM By: Elliot GurneyWoody, BSN, RN, CWS, [name] RN, BSN Entered By: Elliot GurneyWoody, BSN, RN, CWS, [name] on 04/28/2019 14:11:35 [name], [name] ([id number]) -------------------------------------------------------------------------------- Wound Assessment Details Patient Name: [name], [name] Date of Service: 04/28/2019 1:45 PM Medical Record Number: [id number] Patient Account Number: [id number] Date of Birth/Sex: Feb 03, 1931 [age] y.o. M) Treating RN: Huel CoventryWoody, [name] Primary Care Becker Christopher: Marcelino DusterJOHNSTON, JOHN Other Clinician: Referring Kao Berkheimer: Marcelino DusterJOHNSTON, JOHN Treating Stevie Charter/Extender: Altamese CarolinaOBSON, MICHAEL G Weeks in Treatment: 1 Wound Status Wound Number: 1 Primary Venous Leg Ulcer Etiology: Wound Location: Right Lower Leg - Anterior Wound Status: Open Wounding Event: Gradually Appeared Comorbid Cataracts, Coronary Artery Disease, Date Acquired: 03/22/2019 History: Hypertension, Osteoarthritis Weeks Of Treatment: 1 Clustered Wound: No Photos Wound Measurements Length: (cm) 1.1 Width: (cm) 1 Depth: (cm) 0.1 Area: (cm) 0.864 Volume: (cm) 0.086 % Reduction in Area: 0% % Reduction in Volume: 0% Epithelialization: None Tunneling: No Undermining: No Wound Description Full Thickness Without Exposed Support Classification: Structures Wound Margin: Flat and Intact Exudate Medium Amount: Exudate Type: Serous Exudate Color: amber Foul Odor After Cleansing: No Slough/Fibrino Yes Wound Bed Granulation Amount: Large (67-100%) Exposed Structure Granulation Quality: Red, Friable Fascia Exposed: No Necrotic Amount: Small (1-33%) Fat Layer (Subcutaneous Tissue) Exposed: Yes Necrotic Quality: Adherent Slough Tendon Exposed: No Muscle Exposed: No Joint Exposed:  No Bone Exposed: No Brentlinger, Rainer A. ([id number]) Treatment Notes Wound #1 (Right, Anterior Lower Leg) Notes prisma, abd and 3 ayer wrap Electronic Signature(s) Signed: 04/28/2019 4:50:33 PM By: Elliot GurneyWoody, BSN, RN, CWS, [name] RN, BSN Entered By: Elliot GurneyWoody, BSN, RN, CWS, [name] on 04/28/2019 14:02:22 [name], [name] ([id number]) -------------------------------------------------------------------------------- Wound Assessment Details Patient Name: [name], [name] Date of Service: 04/28/2019 1:45 PM Medical Record Number: [id number] Patient Account Number: [id number] Date of Birth/Sex: Feb 03, 1931 [age] y.o. M) Treating RN: Huel CoventryWoody, [name] Primary Care Anevay Campanella: Marcelino DusterJOHNSTON, JOHN Other Clinician: Referring Jaben Benegas: Marcelino DusterJOHNSTON, JOHN Treating Frederick Marro/Extender: Altamese CarolinaOBSON, MICHAEL G Weeks in Treatment: 1 Wound Status Wound Number: 2 Primary Venous Leg Ulcer Etiology: Wound Location: Right Lower Leg - Lateral Wound Status: Healed - Epithelialized Wounding Event: Gradually Appeared Comorbid Cataracts, Coronary Artery Disease, Date Acquired: 03/22/2019 History: Hypertension, Osteoarthritis Weeks Of Treatment: 1 Clustered Wound: No Photos Wound Measurements Length: (cm) Width: (cm) Depth: (cm) Area: (cm) Volume: (cm) 0 % Reduction in Area: 100% 0 % Reduction in Volume: 100% 0 Epithelialization: Large (67-100%) 0 0 Wound Description Full Thickness Without Exposed Support Classification: Structures Wound Bed Granulation Amount: None Present (0%) Necrotic Amount: None Present (0%) Electronic Signature(s) Signed: 04/28/2019 4:50:33 PM By: Elliot GurneyWoody, BSN, RN, CWS, [name] RN, BSN Entered By: Elliot GurneyWoody, BSN, RN, CWS, [name] on 04/28/2019 14:02:49 [name], [name] ([id number]) -------------------------------------------------------------------------------- Vitals Details Patient Name: [name], Crawford A. Date of Service: 04/28/2019 1:45 PM Medical Record Number: [id number] Patient Account Number: [id number] Date of  Birth/Sex: Feb 03, 1931 [age] y.o. M) Treating RN: Huel CoventryWoody, [name] Primary Care Othella Slappey: Marcelino DusterJOHNSTON, JOHN Other Clinician: Referring Harvy Riera: Marcelino DusterJOHNSTON, JOHN Treating Jaeli Grubb/Extender: Altamese CarolinaOBSON, MICHAEL G Weeks in Treatment: 1  Vital Signs Time Taken: 13:54 Temperature (F): 97.6 Height (in): 71 Pulse (bpm): 58 Weight (lbs): 195 Respiratory Rate (breaths/min): 18 Body Mass Index (BMI): 27.2 Blood Pressure (mmHg): 135/61 Reference Range: 80 - 120 mg / dl Electronic Signature(s) Signed: 04/28/2019 4:50:33 PM By: Elliot Gurney, BSN, RN, CWS, [name] RN, BSN Entered By: Elliot Gurney, BSN, RN, CWS, [name] on 04/28/2019 13:54:28

## 2019-04-28 NOTE — Progress Notes (Addendum)
CONG, HIGHTOWER ([id number]) Visit Report for 04/28/2019 HPI Details Patient Name: [name], [name] Date of Service: 04/28/2019 1:45 PM Medical Record Number: [id number] Patient Account Number: [id number] Date of Birth/Sex: Sep 28, 1931 (85 y.o. M) Treating RN: Huel Coventry Primary Care Provider: [name] Other Clinician: Referring Provider: [name] Treating Provider/Extender: Altamese [place] in Treatment: 1 History of Present Illness HPI Description: 04/21/2019 ADMISSION This is an independent 85 year old man who lives in the independent part of [address] of [place]. He has a history of chronic lower extremity edema and wears compression stockings. He states about a month ago he was taking off the one on the right and pulled some skin off accidentally. He has had 2 small wounds on the right anterior and right medial lower extremity. I note looking through [facility] health link that he had multiple venous ultrasounds in 2015 and 16. These were DVT rule outs. He did have a Baker's cyst on the left. He has not not had a previous wound history although he did have a history of cellulitis in his legs. Past medical history; includes aortic stenosis status post mechanical AVR in 2007 on chronic Coumadin, lower extremity edema with a history of cellulitis, history of MRSA. ABIs in our clinic were 1.1 on the right 6/3; patient with predominantly venous insufficiency ulcers in the right lower leg probably some degree of lymphedema. He had to wounds last week. We put him in 3 layer compression. The nurses at [facility] are changing the dressing. The area laterally is healed but he still has a small very painful area on the anterior tibial area. He is on Coumadin because of mechanical aortic valve. Electronic Signature(s) Signed: 04/28/2019 4:12:25 PM By: Baltazar Najjar MD Entered By: Baltazar Najjar on 04/28/2019 14:21:44 Joellyn Quails  ([id number]) -------------------------------------------------------------------------------- Physical Exam Details Patient Name: [name] A. Date of Service: 04/28/2019 1:45 PM Medical Record Number: [id number] Patient Account Number: [id number] Date of Birth/Sex: 1931-03-09 (85 y.o. M) Treating RN: Huel Coventry Primary Care Provider: [name] Other Clinician: Referring Provider: [name] Treating Provider/Extender: Altamese [place] in Treatment: 1 Constitutional Sitting or standing Blood Pressure is within target range for patient.. Pulse regular and within target range for patient.Marland Kitchen Respirations regular, non-labored and within target range.. Temperature is normal and within the target range for the patient.Marland Kitchen appears in no distress. Eyes Conjunctivae clear. No discharge. Respiratory Respiratory effort is easy and symmetric bilaterally. Rate is normal at rest and on room air.. Cardiovascular Popliteal and femoral pulses palpable. Pedal pulses palpable and strong bilaterally.. Lymphatic None palpable in the right popliteal or femoral area. Psychiatric No evidence of depression, anxiety, or agitation. Calm, cooperative, and communicative. Appropriate interactions and affect.. Notes Wound exam; the patient has 2 wounds on the distal right lower leg. Fortunately the lateral area has closed.Marland Kitchen He still has the anterior wound. This had a better surface than last week no debridement was required. Notable that he has chronic stasis dermatitis however a lot of tenderness around the small wound for perhaps an inch in diameter. I cannot rule out cellulitis in this area Electronic Signature(s) Signed: 04/28/2019 4:12:25 PM By: Baltazar Najjar MD Entered By: Baltazar Najjar on 04/28/2019 14:23:36 [name] AMarland Kitchen ([id number]) -------------------------------------------------------------------------------- Physician Orders Details Patient Name: [name] A. Date of  Service: 04/28/2019 1:45 PM Medical Record Number: [id number] Patient Account Number: [id number] Date of Birth/Sex: 1931-02-14 (84 y.o. M) Treating RN: Huel Coventry Primary Care Provider: [name] Other Clinician: Referring Provider: [name]  Treating Provider/Extender: Maxwell Caul Weeks in Treatment: 1 Verbal / Phone Orders: No Diagnosis Coding Wound Cleansing Wound #1 Right,Anterior Lower Leg o May Shower, gently pat wound dry prior to applying new dressing. - Cast protector to keep wrap dry. Anesthetic (add to Medication List) Wound #1 Right,Anterior Lower Leg o Topical Lidocaine 4% cream applied to wound bed prior to debridement (In Clinic Only). Primary Wound Dressing Wound #1 Right,Anterior Lower Leg o Silver Collagen Secondary Dressing Wound #1 Right,Anterior Lower Leg o Dry Gauze Dressing Change Frequency Wound #1 Right,Anterior Lower Leg o Change Dressing Monday, Wednesday, Friday Follow-up Appointments Wound #1 Right,Anterior Lower Leg o Return Appointment in 1 week. Edema Control Wound #1 Right,Anterior Lower Leg o 3 Layer Compression System - Right Lower Extremity Home Health Wound #1 Right,Anterior Lower Leg o Continue Home Health Visits - [facility] nursing o Home Health Nurse may visit PRN to address patientos wound care needs. o FACE TO FACE ENCOUNTER: MEDICARE and MEDICAID PATIENTS: I certify that this patient is under my care and that I had a face-to-face encounter that meets the physician face-to-face encounter requirements with this patient on this date. The encounter with the patient was in whole or in part for the following MEDICAL CONDITION: (primary reason for Home Healthcare) MEDICAL NECESSITY: I certify, that based on my findings, NURSING services are a medically necessary home health service. HOME BOUND STATUS: I certify that my clinical findings support that this patient is homebound (i.e., Due to illness  or injury, pt requires aid of supportive devices such as crutches, cane, wheelchairs, walkers, the use of special transportation or the assistance of another person to leave their place of residence. There is a normal inability to leave the home and doing so requires considerable and taxing effort. Other absences are for medical reasons / religious services and are infrequent or of short duration when for other reasons). Hazelbaker, Alder A. ([id number]) o If current dressing causes regression in wound condition, may D/C ordered dressing product/s and apply Normal Saline Moist Dressing daily until next Wound Healing Center / Other MD appointment. Notify Wound Healing Center of regression in wound condition at [phone]. o Please direct any NON-WOUND related issues/requests for orders to patient's Primary Care Physician Medications-please add to medication list. Wound #1 Right,Anterior Lower Leg o P.O. Antibiotics Patient Medications Allergies: Levaquin, Sulfa (Sulfonamide Antibiotics) Notifications Medication Indication Start End doxycycline monohydrate wound infection 04/28/2019 right leg DOSE oral 100 mg capsule - 1 capsule oral bid for 5 days Electronic Signature(s) Signed: 04/28/2019 2:12:30 PM By: Baltazar Najjar MD Entered By: Baltazar Najjar on 04/28/2019 14:12:30 [name] A. ([id number]) -------------------------------------------------------------------------------- Problem List Details Patient Name: [name] A. Date of Service: 04/28/2019 1:45 PM Medical Record Number: [id number] Patient Account Number: [id number] Date of Birth/Sex: [birth year] (85 y.o. M) Treating RN: Huel Coventry Primary Care Provider: [name] Other Clinician: Referring Provider: [name] Treating Provider/Extender: Altamese [place] in Treatment: 1 Active Problems ICD-10 Evaluated Encounter Code Description Active Date Today Diagnosis I87.331 Chronic venous hypertension  (idiopathic) with ulcer and 04/21/2019 No Yes inflammation of right lower extremity L97.811 Non-pressure chronic ulcer of other part of right lower leg 04/21/2019 No Yes limited to breakdown of skin L03.115 Cellulitis of right lower limb 04/28/2019 No Yes Inactive Problems Resolved Problems Electronic Signature(s) Signed: 04/28/2019 4:12:25 PM By: Baltazar Najjar MD Entered By: Baltazar Najjar on 04/28/2019 14:20:18 [name] A. ([id number]) -------------------------------------------------------------------------------- Progress Note Details Patient Name: [name] A. Date of Service: 04/28/2019 1:45  PM Medical Record Number: [id number] Patient Account Number: [id number] Date of Birth/Sex: [birth year] (85 y.o. M) Treating RN: Huel Coventry Primary Care Provider: [name] Other Clinician: Referring Provider: [name] Treating Provider/Extender: Altamese [place] in Treatment: 1 Subjective History of Present Illness (HPI) 04/21/2019 ADMISSION This is an independent 85 year old man who lives in the independent part of [address] of [place]. He has a history of chronic lower extremity edema and wears compression stockings. He states about a month ago he was taking off the one on the right and pulled some skin off accidentally. He has had 2 small wounds on the right anterior and right medial lower extremity. I note looking through [facility] health link that he had multiple venous ultrasounds in 2015 and 16. These were DVT rule outs. He did have a Baker's cyst on the left. He has not not had a previous wound history although he did have a history of cellulitis in his legs. Past medical history; includes aortic stenosis status post mechanical AVR in 2007 on chronic Coumadin, lower extremity edema with a history of cellulitis, history of MRSA. ABIs in our clinic were 1.1 on the right 6/3; patient with predominantly venous insufficiency ulcers in the right lower leg probably some  degree of lymphedema. He had to wounds last week. We put him in 3 layer compression. The nurses at [facility] are changing the dressing. The area laterally is healed but he still has a small very painful area on the anterior tibial area. He is on Coumadin because of mechanical aortic valve. Objective Constitutional Sitting or standing Blood Pressure is within target range for patient.. Pulse regular and within target range for patient.Marland Kitchen Respirations regular, non-labored and within target range.. Temperature is normal and within the target range for the patient.Marland Kitchen appears in no distress. Vitals Time Taken: 1:54 PM, Height: 71 in, Weight: 195 lbs, BMI: 27.2, Temperature: 97.6 F, Pulse: 58 bpm, Respiratory Rate: 18 breaths/min, Blood Pressure: 135/61 mmHg. Eyes Conjunctivae clear. No discharge. Respiratory Respiratory effort is easy and symmetric bilaterally. Rate is normal at rest and on room air.. Cardiovascular Kielty, Caesar A. ([id number]) Popliteal and femoral pulses palpable. Pedal pulses palpable and strong bilaterally.. Lymphatic None palpable in the right popliteal or femoral area. Psychiatric No evidence of depression, anxiety, or agitation. Calm, cooperative, and communicative. Appropriate interactions and affect.. General Notes: Wound exam; the patient has 2 wounds on the distal right lower leg. Fortunately the lateral area has closed.Marland Kitchen He still has the anterior wound. This had a better surface than last week no debridement was required. Notable that he has chronic stasis dermatitis however a lot of tenderness around the small wound for perhaps an inch in diameter. I cannot rule out cellulitis in this area Integumentary (Hair, Skin) Wound #1 status is Open. Original cause of wound was Gradually Appeared. The wound is located on the Right,Anterior Lower Leg. The wound measures 1.1cm length x 1cm width x 0.1cm depth; 0.864cm^2 area and 0.086cm^3 volume. There is Fat  Layer (Subcutaneous Tissue) Exposed exposed. There is no tunneling or undermining noted. There is a medium amount of serous drainage noted. The wound margin is flat and intact. There is large (67-100%) red, friable granulation within the wound bed. There is a small (1-33%) amount of necrotic tissue within the wound bed including Adherent Slough. Wound #2 status is Healed - Epithelialized. Original cause of wound was Gradually Appeared. The wound is located on the Right,Lateral Lower Leg. The wound measures 0cm length x 0cm  width x 0cm depth; 0cm^2 area and 0cm^3 volume. There is no granulation within the wound bed. There is no necrotic tissue within the wound bed. Assessment Active Problems ICD-10 Chronic venous hypertension (idiopathic) with ulcer and inflammation of right lower extremity Non-pressure chronic ulcer of other part of right lower leg limited to breakdown of skin Cellulitis of right lower limb Diagnoses ICD-10 I87.331: Chronic venous hypertension (idiopathic) with ulcer and inflammation of right lower extremity L97.811: Non-pressure chronic ulcer of other part of right lower leg limited to breakdown of skin Procedures Wound #1 Pre-procedure diagnosis of Wound #1 is a Venous Leg Ulcer located on the Right,Anterior Lower Leg . There was a Three Layer Compression Therapy Procedure with a pre-treatment ABI of 1.1 by Huel Coventry, RN. Post procedure Diagnosis Wound #1: Same as Pre-Procedure Lozito, Tyrek A. ([id number]) Plan Wound Cleansing: Wound #1 Right,Anterior Lower Leg: May Shower, gently pat wound dry prior to applying new dressing. - Cast protector to keep wrap dry. Anesthetic (add to Medication List): Wound #1 Right,Anterior Lower Leg: Topical Lidocaine 4% cream applied to wound bed prior to debridement (In Clinic Only). Primary Wound Dressing: Wound #1 Right,Anterior Lower Leg: Silver Collagen Secondary Dressing: Wound #1 Right,Anterior Lower Leg: Dry  Gauze Dressing Change Frequency: Wound #1 Right,Anterior Lower Leg: Change Dressing Monday, Wednesday, Friday Follow-up Appointments: Wound #1 Right,Anterior Lower Leg: Return Appointment in 1 week. Edema Control: Wound #1 Right,Anterior Lower Leg: 3 Layer Compression System - Right Lower Extremity Home Health: Wound #1 Right,Anterior Lower Leg: Continue [facility] Visits - [facility] Home Health Nurse may visit PRN to address patient s wound care needs. FACE TO FACE ENCOUNTER: MEDICARE and MEDICAID PATIENTS: I certify that this patient is under my care and that I had a face-to-face encounter that meets the physician face-to-face encounter requirements with this patient on this date. The encounter with the patient was in whole or in part for the following MEDICAL CONDITION: (primary reason for Home Healthcare) MEDICAL NECESSITY: I certify, that based on my findings, NURSING services are a medically necessary home health service. HOME BOUND STATUS: I certify that my clinical findings support that this patient is homebound (i.e., Due to illness or injury, pt requires aid of supportive devices such as crutches, cane, wheelchairs, walkers, the use of special transportation or the assistance of another person to leave their place of residence. There is a normal inability to leave the home and doing so requires considerable and taxing effort. Other absences are for medical reasons / religious services and are infrequent or of short duration when for other reasons). If current dressing causes regression in wound condition, may D/C ordered dressing product/s and apply Normal Saline Moist Dressing daily until next Wound Healing Center / Other MD appointment. Notify Wound Healing Center of regression in wound condition at [phone]. Please direct any NON-WOUND related issues/requests for orders to patient's Primary Care Physician Medications-please add to medication  list.: Wound #1 Right,Anterior Lower Leg: P.O. Antibiotics The following medication(s) was prescribed: doxycycline monohydrate oral 100 mg capsule 1 capsule oral bid for 5 days for wound infection right leg starting 04/28/2019 1. Right anterior lower leg. I am continuing silver collagen under 3 layer compression 2. Possibility of cellulitis I have given him doxycycline 100 twice daily for 5 days. Careful it attention to his risk for bleeding on Coumadin although this is only 5 days of treatment. I have not found doxycycline to be a high risk medication Electronic Signature(s) Signed: 04/28/2019 4:12:25 PM  By: Baltazar Najjarobson, Jeanette Rauth MD [facility], Oren BracketSYDNOR AMarland Kitchen. ([id number]) Entered By: Baltazar Najjarobson, Gracia Saggese on 04/28/2019 14:24:40 [name], Steele A. ([id number]) -------------------------------------------------------------------------------- SuperBill Details Patient Name: [name] HiltsSCHENK, Dimitrius A. Date of Service: 04/28/2019 Medical Record Number: [id number] Patient Account Number: [id number] Date of Birth/Sex: [birth year] (85 y.o. M) Treating RN: Huel CoventryWoody, [name] Primary Care Provider: Marcelino DusterJOHNSTON, JOHN Other Clinician: Referring Provider: Marcelino DusterJOHNSTON, JOHN Treating Provider/Extender: Altamese CarolinaOBSON, Deziyah Arvin G Weeks in Treatment: 1 Diagnosis Coding ICD-10 Codes Code Description [phone] Chronic venous hypertension (idiopathic) with ulcer and inflammation of right lower extremity L97.811 Non-pressure chronic ulcer of other part of right lower leg limited to breakdown of skin Facility Procedures CPT4 Code Description: [id number] (Facility Use Only) [id number] - APPLY MULTLAY COMPRS LWR RT LEG Modifier: Quantity: 1 Physician Procedures CPT4: Description Modifier Quantity Code [id number] 99213 - WC PHYS LEVEL 3 - EST PT 1 ICD-10 Diagnosis Description I87.331 Chronic venous hypertension (idiopathic) with ulcer and inflammation of right lower extremity L97.811 Non-pressure chronic ulcer of  other part of right lower leg limited to breakdown of  skin Electronic Signature(s) Signed: 04/28/2019 4:12:25 PM By: Baltazar Najjarobson, [name] [name] MD Entered By: Baltazar Najjarobson, Tobin Cadiente on 04/28/2019 14:25:01

## 2019-04-29 ENCOUNTER — Telehealth: Payer: Self-pay | Admitting: *Deleted

## 2019-04-29 NOTE — Telephone Encounter (Signed)
Due to current COVID 19 pandemic, our office is severely reducing in office visits until further notice, in order to minimize the risk to our patients and healthcare providers. Unable to get in contact with the patient to convert their office visit with Sarah on 05/04/2019 into a doxy.me visit. I left a voicemail asking the patient to return my call. Office number was provided.     If patient calls back please convert their office visit into a doxy.me visit.      

## 2019-04-30 NOTE — Telephone Encounter (Signed)
Pt returned call and consented to a Virtual Visit. Link sent to [phone] [employer]  Pt understands that although there may be some limitations with this type of visit, we will take all precautions to reduce any security or privacy concerns.  Pt understands that this will be treated like an in office visit and we will file with pt's insurance, and there may be a patient responsible charge related to this service.

## 2019-05-03 NOTE — Telephone Encounter (Signed)
Pt called to cancel states he will return call once hes ready to schedule again

## 2019-05-03 NOTE — Telephone Encounter (Signed)
Noted  

## 2019-05-04 ENCOUNTER — Ambulatory Visit: Payer: Medicare Other | Admitting: Neurology

## 2019-05-05 ENCOUNTER — Other Ambulatory Visit: Payer: Self-pay

## 2019-05-05 ENCOUNTER — Encounter: Payer: Medicare Other | Admitting: Internal Medicine

## 2019-05-05 DIAGNOSIS — I87331 Chronic venous hypertension (idiopathic) with ulcer and inflammation of right lower extremity: Secondary | ICD-10-CM | POA: Diagnosis not present

## 2019-05-05 NOTE — Progress Notes (Signed)
[name], Clemons A. ([id number]) Visit Report for 05/05/2019 Arrival Information Details Patient Name: [name], Maddon A. Date of Service: 05/05/2019 1:00 PM Medical Record Number: [id number] Patient Account Number: [id number] Date of Birth/Sex: 1931/01/19 (85 y.o. M) Treating RN: Curtis Sitesorthy, Joanna Primary Care Shayna Eblen: Marcelino DusterJOHNSTON, JOHN Other Clinician: Referring Jerry Haugen: Marcelino DusterJOHNSTON, JOHN Treating Farhan Jean/Extender: Altamese CarolinaOBSON, MICHAEL G Weeks in Treatment: 2 Visit Information History Since Last Visit Added or deleted any medications: No Patient Arrived: Walker Any new allergies or adverse reactions: No Arrival Time: 13:06 Had a fall or experienced change in No Accompanied By: self activities of daily living that may affect Transfer Assistance: None risk of falls: Patient Identification Verified: Yes Signs or symptoms of abuse/neglect since last visito No Secondary Verification Process Yes Hospitalized since last visit: No Completed: Implantable device outside of the clinic excluding No Patient Has Alerts: Yes cellular tissue based products placed in the center Patient Alerts: Patient on Blood since last visit: Thinner Has Dressing in Place as Prescribed: Yes Warfarin Has Compression in Place as Prescribed: Yes Pain Present Now: No Electronic Signature(s) Signed: 05/05/2019 3:08:26 PM By: Curtis Sitesorthy, Joanna Entered By: Curtis Sitesorthy, Joanna on 05/05/2019 13:07:03 [name], [name] ([id number]) -------------------------------------------------------------------------------- Encounter Discharge Information Details Patient Name: [name], [name] Date of Service: 05/05/2019 1:00 PM Medical Record Number: [id number] Patient Account Number: [id number] Date of Birth/Sex: 1931/01/19 (85 y.o. M) Treating RN: Arnette NorrisBiell, Kristina Primary Care Candace Ramus: Marcelino DusterJOHNSTON, JOHN Other Clinician: Referring Johnpatrick Jenny: Marcelino DusterJOHNSTON, JOHN Treating Tarah Buboltz/Extender: Altamese CarolinaOBSON, MICHAEL G Weeks in Treatment: 2 Encounter Discharge  Information Items Post Procedure Vitals Discharge Condition: Stable Temperature (F): 97.9 Ambulatory Status: Ambulatory Pulse (bpm): 49 Discharge Destination: Home Respiratory Rate (breaths/min): 18 Transportation: Private Auto Blood Pressure (mmHg): 134/58 Accompanied By: self Schedule Follow-up Appointment: Yes Clinical Summary of Care: Electronic Signature(s) Signed: 05/05/2019 4:18:59 PM By: Arnette NorrisBiell, Kristina Entered By: Arnette NorrisBiell, Kristina on 05/05/2019 13:39:50 [name], Ritvik A. ([id number]) -------------------------------------------------------------------------------- Lower Extremity Assessment Details Patient Name: [name], [name] Date of Service: 05/05/2019 1:00 PM Medical Record Number: [id number] Patient Account Number: [id number] Date of Birth/Sex: 1931/01/19 (85 y.o. M) Treating RN: Curtis Sitesorthy, Joanna Primary Care Weslie Rasmus: Marcelino DusterJOHNSTON, JOHN Other Clinician: Referring Ema Hebner: Marcelino DusterJOHNSTON, JOHN Treating Daanish Copes/Extender: Altamese CarolinaOBSON, MICHAEL G Weeks in Treatment: 2 Edema Assessment Assessed: [Left: No] [Right: No] Edema: [Left: N] [Right: o] Calf Left: Right: Point of Measurement: 36 cm From Medial Instep cm 36.5 cm Ankle Left: Right: Point of Measurement: 12 cm From Medial Instep cm 22.7 cm Vascular Assessment Pulses: Dorsalis Pedis Palpable: [Right:Yes] Electronic Signature(s) Signed: 05/05/2019 3:08:26 PM By: Curtis Sitesorthy, Joanna Entered By: Curtis Sitesorthy, Joanna on 05/05/2019 13:15:36 Bolander, Kanav A. ([id number]) -------------------------------------------------------------------------------- Multi Wound Chart Details Patient Name: [name], [name] Date of Service: 05/05/2019 1:00 PM Medical Record Number: [id number] Patient Account Number: [id number] Date of Birth/Sex: 1931/01/19 (85 y.o. M) Treating RN: Arnette NorrisBiell, Kristina Primary Care Monik Lins: Marcelino DusterJOHNSTON, JOHN Other Clinician: Referring Bassy Fetterly: Marcelino DusterJOHNSTON, JOHN Treating Armando Bukhari/Extender: Altamese CarolinaOBSON, MICHAEL G Weeks in Treatment:  2 Vital Signs Height(in): 71 Pulse(bpm): 49 Weight(lbs): 195 Blood Pressure(mmHg): 134/58 Body Mass Index(BMI): 27 Temperature(F): 97.9 Respiratory Rate 18 (breaths/min): Photos: [N/A:N/A] Wound Location: Right Lower Leg - Anterior N/A N/A Wounding Event: Gradually Appeared N/A N/A Primary Etiology: Venous Leg Ulcer N/A N/A Comorbid History: Cataracts, Coronary Artery N/A N/A Disease, Hypertension, Osteoarthritis Date Acquired: 03/22/2019 N/A N/A Weeks of Treatment: 2 N/A N/A Wound Status: Open N/A N/A Measurements L x W x D 0.9x0.8x0.1 N/A N/A (cm) Area (cm) : 0.565 N/A N/A Volume (cm) : 0.057 N/A N/A % Reduction in Area: 34.60% N/A N/A %  Reduction in Volume: 33.70% N/A N/A Classification: Full Thickness Without N/A N/A Exposed Support Structures Exudate Amount: Medium N/A N/A Exudate Type: Serous N/A N/A Exudate Color: amber N/A N/A Wound Margin: Flat and Intact N/A N/A Granulation Amount: Medium (34-66%) N/A N/A Granulation Quality: Red N/A N/A Necrotic Amount: Medium (34-66%) N/A N/A Exposed Structures: Fat Layer (Subcutaneous N/A N/A Tissue) Exposed: Yes Fascia: No Tendon: No Muscle: No Pyper, Cavon A. ([id number]) Joint: No Bone: No Epithelialization: None N/A N/A Debridement: Debridement - Excisional N/A N/A Pre-procedure 13:31 N/A N/A Verification/Time Out Taken: Pain Control: Other N/A N/A Tissue Debrided: Subcutaneous, Slough N/A N/A Level: Skin/Subcutaneous Tissue N/A N/A Debridement Area (sq cm): 0.72 N/A N/A Instrument: Curette N/A N/A Bleeding: Minimum N/A N/A Hemostasis Achieved: Pressure N/A N/A Procedural Pain: 8 N/A N/A Post Procedural Pain: 8 N/A N/A Debridement Treatment Procedure was tolerated well N/A N/A Response: Post Debridement 0.9x0.8x0.1 N/A N/A Measurements L x W x D (cm) Post Debridement Volume: 0.057 N/A N/A (cm) Procedures Performed: Debridement N/A N/A Treatment Notes Wound #1 (Right, Anterior Lower  Leg) Notes prisma, abd and 3 layer wrap Electronic Signature(s) Signed: 05/05/2019 4:01:30 PM By: Linton Ham MD Entered By: Linton Ham on 05/05/2019 13:44:08 [name] Bridge A. ([id number]) -------------------------------------------------------------------------------- [place] Details Patient Name: [name] Bridge A. Date of Service: 05/05/2019 1:00 PM Medical Record Number: [id number] Patient Account Number: [id number] Date of Birth/Sex: Mar 16, 1931 (85 y.o. M) Treating RN: Harold Barban Primary Care Izella Ybanez: [name] Lemon Other Clinician: Referring Rahima Fleishman: [name] Lemon Treating Sang Blount/Extender: Tito Dine in Treatment: 2 Active Inactive Abuse / Safety / Falls / Self Care Management Nursing Diagnoses: Potential for falls Goals: Patient will not experience any injury related to falls Date Initiated: 04/21/2019 Target Resolution Date: 04/21/2019 Goal Status: Active Interventions: Podiatry chair, stretcher in low position and side rails up as needed Notes: Venous Leg Ulcer Nursing Diagnoses: Knowledge deficit related to disease process and management Goals: Verify adequate tissue perfusion prior to therapeutic compression application Date Initiated: 04/21/2019 Target Resolution Date: 04/21/2019 Goal Status: Active Interventions: Provide education on venous insufficiency Treatment Activities: Therapeutic compression applied : 04/21/2019 Notes: Wound/Skin Impairment Nursing Diagnoses: Impaired tissue integrity Goals: Ulcer/skin breakdown will have a volume reduction of 30% by week 4 Date Initiated: 04/21/2019 Target Resolution Date: 05/22/2019 Goal Status: Active [name], [name] ([id number]) Interventions: Assess ulceration(s) every visit Treatment Activities: Skin care regimen initiated : 04/21/2019 Notes: Electronic Signature(s) Signed: 05/05/2019 4:18:59 PM By: Harold Barban Entered By: Harold Barban on 05/05/2019  13:30:52 [place], Mack A. ([id number]) -------------------------------------------------------------------------------- Pain Assessment Details Patient Name: [name] Bridge A. Date of Service: 05/05/2019 1:00 PM Medical Record Number: [id number] Patient Account Number: [id number] Date of Birth/Sex: 1931-06-21 (85 y.o. M) Treating RN: Montey Hora Primary Care Earlee Herald: [name] Lemon Other Clinician: Referring Sanae Willetts: [name] Lemon Treating Vanderbilt Ranieri/Extender: Tito Dine in Treatment: 2 Active Problems Location of Pain Severity and Description of Pain Patient Has Paino Yes Site Locations Pain Location: Pain in Ulcers With Dressing Change: Yes Duration of the Pain. Constant / Intermittento Constant Pain Management and Medication Current Pain Management: Electronic Signature(s) Signed: 05/05/2019 3:08:26 PM By: Montey Hora Entered By: Montey Hora on 05/05/2019 13:07:14 [name] Bridge A. ([id number]) -------------------------------------------------------------------------------- Patient/Caregiver Education Details Patient Name: [name] Bridge A. Date of Service: 05/05/2019 1:00 PM Medical Record Number: [id number] Patient Account Number: [id number] Date of Birth/Gender: 16-Feb-1931 (85 y.o. M) Treating RN: Harold Barban Primary Care Physician: [name] Lemon Other Clinician: Referring Physician: Harrel Lemon Treating Physician/Extender: Tito Dine in Treatment:  2 Education Assessment Education Provided To: Patient Education Topics Provided Wound/Skin Impairment: Handouts: Caring for Your Ulcer Methods: Demonstration, Explain/Verbal Responses: State content correctly Electronic Signature(s) Signed: 05/05/2019 4:18:59 PM By: Arnette NorrisBiell, Kristina Entered By: Arnette NorrisBiell, Kristina on 05/05/2019 13:34:39 [name], [name] ([id number]) -------------------------------------------------------------------------------- Wound Assessment Details Patient  Name: [name], [name] Date of Service: 05/05/2019 1:00 PM Medical Record Number: [id number] Patient Account Number: [id number] Date of Birth/Sex: Nov 18, 1931 (85 y.o. M) Treating RN: Curtis Sitesorthy, Joanna Primary Care Nayleen Janosik: Marcelino DusterJOHNSTON, JOHN Other Clinician: Referring Ariahna Smiddy: Marcelino DusterJOHNSTON, JOHN Treating Langford Carias/Extender: Altamese CarolinaOBSON, MICHAEL G Weeks in Treatment: 2 Wound Status Wound Number: 1 Primary Venous Leg Ulcer Etiology: Wound Location: Right Lower Leg - Anterior Wound Status: Open Wounding Event: Gradually Appeared Comorbid Cataracts, Coronary Artery Disease, Date Acquired: 03/22/2019 History: Hypertension, Osteoarthritis Weeks Of Treatment: 2 Clustered Wound: No Photos Wound Measurements Length: (cm) 0.9 Width: (cm) 0.8 Depth: (cm) 0.1 Area: (cm) 0.565 Volume: (cm) 0.057 % Reduction in Area: 34.6% % Reduction in Volume: 33.7% Epithelialization: None Tunneling: No Undermining: No Wound Description Full Thickness Without Exposed Support Classification: Structures Wound Margin: Flat and Intact Exudate Medium Amount: Exudate Type: Serous Exudate Color: amber Foul Odor After Cleansing: No Slough/Fibrino Yes Wound Bed Granulation Amount: Medium (34-66%) Exposed Structure Granulation Quality: Red Fascia Exposed: No Necrotic Amount: Medium (34-66%) Fat Layer (Subcutaneous Tissue) Exposed: Yes Necrotic Quality: Adherent Slough Tendon Exposed: No Muscle Exposed: No Joint Exposed: No Bone Exposed: No [name], [name] ([id number]) Treatment Notes Wound #1 (Right, Anterior Lower Leg) Notes prisma, abd and 3 layer wrap Electronic Signature(s) Signed: 05/05/2019 3:08:26 PM By: Curtis Sitesorthy, Joanna Entered By: Curtis Sitesorthy, Joanna on 05/05/2019 13:20:01 [name], Raequan A. ([id number]) -------------------------------------------------------------------------------- Vitals Details Patient Name: [name], Cletus A. Date of Service: 05/05/2019 1:00 PM Medical Record Number:  [id number] Patient Account Number: [id number] Date of Birth/Sex: Nov 18, 1931 (85 y.o. M) Treating RN: Curtis Sitesorthy, Joanna Primary Care Melana Hingle: Marcelino DusterJOHNSTON, JOHN Other Clinician: Referring Abhinav Mayorquin: Marcelino DusterJOHNSTON, JOHN Treating Malkia Nippert/Extender: Altamese CarolinaOBSON, MICHAEL G Weeks in Treatment: 2 Vital Signs Time Taken: 13:09 Temperature (F): 97.9 Height (in): 71 Pulse (bpm): 49 Weight (lbs): 195 Respiratory Rate (breaths/min): 18 Body Mass Index (BMI): 27.2 Blood Pressure (mmHg): 134/58 Reference Range: 80 - 120 mg / dl Electronic Signature(s) Signed: 05/05/2019 3:08:26 PM By: Curtis Sitesorthy, Joanna Entered By: Curtis Sitesorthy, Joanna on 05/05/2019 13:10:20

## 2019-05-05 NOTE — Progress Notes (Signed)
[name], Trexton A. ([id number]) Visit Report for 05/05/2019 Debridement Details Patient Name: [name], [name] Date of Service: 05/05/2019 1:00 PM Medical Record Number: [id number] Patient Account Number: [id number] Date of Birth/Sex: 1931-01-22 (85 y.o. M) Treating RN: Arnette NorrisBiell, Kristina Primary Care Provider: Marcelino DusterJOHNSTON, JOHN Other Clinician: Referring Provider: Marcelino DusterJOHNSTON, JOHN Treating Provider/Extender: Altamese CarolinaOBSON,  G Weeks in Treatment: 2 Debridement Performed for Wound #1 Right,Anterior Lower Leg Assessment: Performed By: Physician Maxwell CaulOBSON,  G, MD Debridement Type: Debridement Severity of Tissue Pre Fat layer exposed Debridement: Level of Consciousness (Pre- Awake and Alert procedure): Pre-procedure Verification/Time Yes - 13:31 Out Taken: Start Time: 13:31 Pain Control: Other : Benzocaine spray Total Area Debrided (L x W): 0.9 (cm) x 0.8 (cm) = 0.72 (cm) Tissue and other material Viable, Non-Viable, Slough, Subcutaneous, Slough debrided: Level: Skin/Subcutaneous Tissue Debridement Description: Excisional Instrument: Curette Bleeding: Minimum Hemostasis Achieved: Pressure End Time: 13:32 Procedural Pain: 8 Post Procedural Pain: 8 Response to Treatment: Procedure was tolerated well Level of Consciousness Awake and Alert (Post-procedure): Post Debridement Measurements of Total Wound Length: (cm) 0.9 Width: (cm) 0.8 Depth: (cm) 0.1 Volume: (cm) 0.057 Character of Wound/Ulcer Post Debridement: Improved Severity of Tissue Post Debridement: Fat layer exposed Post Procedure Diagnosis Same as Pre-procedure Electronic Signature(s) Signed: 05/05/2019 4:01:30 PM By: Baltazar Najjarobson,  MD Signed: 05/05/2019 4:18:59 PM By: Arnette NorrisBiell, Kristina Entered By: Arnette NorrisBiell, Kristina on 05/05/2019 13:34:16 [name], [name] ([id number]) [name], [name] ([id number]) -------------------------------------------------------------------------------- HPI Details Patient Name: [name],  [name] Date of Service: 05/05/2019 1:00 PM Medical Record Number: [id number] Patient Account Number: [id number] Date of Birth/Sex: 1931-01-22 (85 y.o. M) Treating RN: Arnette NorrisBiell, Kristina Primary Care Provider: Marcelino DusterJOHNSTON, JOHN Other Clinician: Referring Provider: Marcelino DusterJOHNSTON, JOHN Treating Provider/Extender: Altamese CarolinaOBSON,  G Weeks in Treatment: 2 History of Present Illness HPI Description: 04/21/2019 ADMISSION This is an independent 85 year old man who lives in the independent part of [address]. He has a history of chronic lower extremity edema and wears compression stockings. He states about a month ago he was taking off the one on the right and pulled some skin off accidentally. He has had 2 small wounds on the right anterior and right medial lower extremity. I note [facility] that he had multiple venous ultrasounds in 2015 and 16. These were DVT rule outs. He did have a Baker's cyst on the left. He has not not had a previous wound history although he did have a history of cellulitis in his legs. Past medical history; includes aortic stenosis status post mechanical AVR in 2007 on chronic Coumadin, lower extremity edema with a history of cellulitis, history of MRSA. ABIs in our clinic were 1.1 on the right 6/3; patient with predominantly venous insufficiency ulcers in the right lower leg probably some degree of lymphedema. He had to wounds last week. We put him in 3 layer compression. [facility] [place] [facility] the dressing. The area laterally is healed but he still has a small very painful area on the anterior tibial area. He is on Coumadin because of mechanical aortic valve. 6/10; venous insufficiency ulcers on the right lower leg. He has some degree of lymphedema. We have been using 3 layer compression with silver collagen small wound. Dimensions are improved. I gave him doxycycline last week which he tolerated because of surrounding  erythema. This is improved also Electronic Signature(s) Signed: 05/05/2019 4:01:30 PM By: Baltazar Najjarobson,  MD Entered By: Baltazar Najjarobson,  on 05/05/2019 13:45:01 [name], Tavaughn A. ([id number]) -------------------------------------------------------------------------------- Physical Exam Details Patient Name: [name], [name]  A. Date of Service: 05/05/2019 1:00 PM Medical Record Number: [id number] Patient Account Number: [id number] Date of Birth/Sex: 11/09/1931 (85 y.o. M) Treating RN: Harold Barban Primary Care Provider: Harrel Lemon Other Clinician: Referring Provider: Harrel Lemon Treating Provider/Extender: Tito Dine in Treatment: 2 Constitutional Sitting or standing Blood Pressure is within target range for patient.. Pulse regular and within target range for patient.Marland Kitchen Respirations regular, non-labored and within target range.. Temperature is normal and within the target range for the patient.Marland Kitchen appears in no distress. Cardiovascular Pedal pulses palpable on the right. Notes Wound exam; distal right lower leg there is only 1 remaining wound. This is the one that is most anterior. Using a #3 curette I removed adherent necrotic debris hemostasis with direct pressure. Electronic Signature(s) Signed: 05/05/2019 4:01:30 PM By: Linton Ham MD Entered By: Linton Ham on 05/05/2019 13:46:02 [name] ([id number]) -------------------------------------------------------------------------------- Physician Orders Details Patient Name: Sharren Bridge A. Date of Service: 05/05/2019 1:00 PM Medical Record Number: [id number] Patient Account Number: [id number] Date of Birth/Sex: 01/22/1931 (85 y.o. M) Treating RN: Harold Barban Primary Care Provider: Harrel Lemon Other Clinician: Referring Provider: Harrel Lemon Treating Provider/Extender: Tito Dine in Treatment: 2 Verbal / Phone Orders: No Diagnosis Coding Wound Cleansing Wound #1  Right,Anterior Lower Leg o May Shower, gently pat wound dry prior to applying new dressing. - Cast protector to keep wrap dry. Anesthetic (add to Medication List) Wound #1 Right,Anterior Lower Leg o Topical Lidocaine 4% cream applied to wound bed prior to debridement (In Clinic Only). Primary Wound Dressing Wound #1 Right,Anterior Lower Leg o Silver Collagen Secondary Dressing Wound #1 Right,Anterior Lower Leg o Dry Gauze - ABD to pad wound area Dressing Change Frequency Wound #1 Right,Anterior Lower Leg o Change Dressing Monday, Wednesday, Friday Follow-up Appointments Wound #1 Right,Anterior Lower Leg o Return Appointment in 1 week. Edema Control Wound #1 Right,Anterior Lower Leg o 3 Layer Compression System - Right Lower Extremity Home Health Wound #1 Right,Anterior Lower Leg o Equality Visits - [place] Nurse may visit PRN to address patientos wound care needs. o FACE TO FACE ENCOUNTER: MEDICARE and MEDICAID PATIENTS: I certify that this patient is under my care and that I had a face-to-face encounter that meets the physician face-to-face encounter requirements with this patient on this date. The encounter with the patient was in whole or in part for the following MEDICAL CONDITION: (primary reason for [place]) MEDICAL NECESSITY: I certify, that based on my findings, NURSING services are a medically necessary home health service. HOME BOUND STATUS: I certify that my clinical findings support that this patient is homebound (i.e., Due to illness or injury, pt requires aid of supportive devices such as crutches, cane, wheelchairs, walkers, the use of special transportation or the assistance of another person to leave their place of residence. There is a normal inability to leave the home and doing so requires considerable and taxing effort. Other absences are for medical reasons / religious services and are  infrequent or of short duration when for other reasons). [place], Walker ([id number]) o If current dressing causes regression in wound condition, may D/C ordered dressing product/s and apply Normal Saline Moist Dressing daily until next [place] / Other MD appointment. [place] of regression in wound condition at [phone]. o Please direct any NON-WOUND related issues/requests for orders to patient's Primary Care Physician Medications-please add to medication list. Wound #1 Right,Anterior Lower Leg o P.O. Antibiotics  Electronic Signature(s) Signed: 05/05/2019 4:01:30 PM By: Baltazar Najjar MD Signed: 05/05/2019 4:18:59 PM By: Arnette Norris Entered By: Arnette Norris on 05/05/2019 13:35:51 Joellyn Quails ([id number]) -------------------------------------------------------------------------------- Problem List Details Patient Name: [name] Hilts A. Date of Service: 05/05/2019 1:00 PM Medical Record Number: [id number] Patient Account Number: [id number] Date of Birth/Sex: 06-11-1931 (85 y.o. M) Treating RN: Arnette Norris Primary Care Provider: [name] Other Clinician: Referring Provider: [name] Treating Provider/Extender: Altamese [place] in Treatment: 2 Active Problems ICD-10 Evaluated Encounter Code Description Active Date Today Diagnosis I87.331 Chronic venous hypertension (idiopathic) with ulcer and 04/21/2019 No Yes inflammation of right lower extremity L97.811 Non-pressure chronic ulcer of other part of right lower leg 04/21/2019 No Yes limited to breakdown of skin L03.115 Cellulitis of right lower limb 04/28/2019 No Yes Inactive Problems Resolved Problems Electronic Signature(s) Signed: 05/05/2019 4:01:30 PM By: Baltazar Najjar MD Entered By: Baltazar Najjar on 05/05/2019 13:44:00 [name], [name] ([id number]) -------------------------------------------------------------------------------- Progress Note  Details Patient Name: [name] Hilts A. Date of Service: 05/05/2019 1:00 PM Medical Record Number: [id number] Patient Account Number: [id number] Date of Birth/Sex: [birth year] (85 y.o. M) Treating RN: Arnette Norris Primary Care Provider: [name] Other Clinician: Referring Provider: [name] Treating Provider/Extender: Altamese Bosworth in Treatment: 2 Subjective History of Present Illness (HPI) 04/21/2019 ADMISSION This is an independent 85 year old man who lives in the independent part of [address] [place]. He has a history of chronic lower extremity edema and wears compression stockings. He states about a month ago he was taking off the one on the right and pulled some skin off accidentally. He has had 2 small wounds on the right anterior and right medial lower extremity. I note looking through [facility] health link that he had multiple venous ultrasounds in 2015 and 16. These were DVT rule outs. He did have a Baker's cyst on the left. He has not not had a previous wound history although he did have a history of cellulitis in his legs. Past medical history; includes aortic stenosis status post mechanical AVR in 2007 on chronic Coumadin, lower extremity edema with a history of cellulitis, history of MRSA. ABIs in our clinic were 1.1 on the right 6/3; patient with predominantly venous insufficiency ulcers in the right lower leg probably some degree of lymphedema. He had to wounds last week. We put him in 3 layer compression. The nurses at [facility] are changing the dressing. The area laterally is healed but he still has a small very painful area on the anterior tibial area. He is on Coumadin because of mechanical aortic valve. 6/10; venous insufficiency ulcers on the right lower leg. He has some degree of lymphedema. We have been using 3 layer compression with silver collagen small wound. Dimensions are improved. I gave him doxycycline last week which he  tolerated because of surrounding erythema. This is improved also Objective Constitutional Sitting or standing Blood Pressure is within target range for patient.. Pulse regular and within target range for patient.Marland Kitchen Respirations regular, non-labored and within target range.. Temperature is normal and within the target range for the patient.Marland Kitchen appears in no distress. Vitals Time Taken: 1:09 PM, Height: 71 in, Weight: 195 lbs, BMI: 27.2, Temperature: 97.9 F, Pulse: 49 bpm, Respiratory Rate: 18 breaths/min, Blood Pressure: 134/58 mmHg. Cardiovascular Pedal pulses palpable on the right. Perona, [name] ([id number]) General Notes: Wound exam; distal right lower leg there is only 1 remaining wound. This is the one that is most anterior. Using a #3 curette I  removed adherent necrotic debris hemostasis with direct pressure. Integumentary (Hair, Skin) Wound #1 status is Open. Original cause of wound was Gradually Appeared. The wound is located on the Right,Anterior Lower Leg. The wound measures 0.9cm length x 0.8cm width x 0.1cm depth; 0.565cm^2 area and 0.057cm^3 volume. There is Fat Layer (Subcutaneous Tissue) Exposed exposed. There is no tunneling or undermining noted. There is a medium amount of serous drainage noted. The wound margin is flat and intact. There is medium (34-66%) red granulation within the wound bed. There is a medium (34-66%) amount of necrotic tissue within the wound bed including Adherent Slough. Assessment Active Problems ICD-10 Chronic venous hypertension (idiopathic) with ulcer and inflammation of right lower extremity Non-pressure chronic ulcer of other part of right lower leg limited to breakdown of skin Cellulitis of right lower limb Procedures Wound #1 Pre-procedure diagnosis of Wound #1 is a Venous Leg Ulcer located on the Right,Anterior Lower Leg .Severity of Tissue Pre Debridement is: Fat layer exposed. There was a Excisional Skin/Subcutaneous Tissue Debridement  with a total area of 0.72 sq cm performed by Maxwell CaulOBSON,  G, MD. With the following instrument(s): Curette to remove Viable and Non-Viable tissue/material. Material removed includes Subcutaneous Tissue and Slough and after achieving pain control using Other (Benzocaine spray). No specimens were taken. A time out was conducted at 13:31, prior to the start of the procedure. A Minimum amount of bleeding was controlled with Pressure. The procedure was tolerated well with a pain level of 8 throughout and a pain level of 8 following the procedure. Post Debridement Measurements: 0.9cm length x 0.8cm width x 0.1cm depth; 0.057cm^3 volume. Character of Wound/Ulcer Post Debridement is improved. Severity of Tissue Post Debridement is: Fat layer exposed. Post procedure Diagnosis Wound #1: Same as Pre-Procedure Plan Wound Cleansing: Wound #1 Right,Anterior Lower Leg: May Shower, gently pat wound dry prior to applying new dressing. - Cast protector to keep wrap dry. Anesthetic (add to Medication List): Wound #1 Right,Anterior Lower Leg: Topical Lidocaine 4% cream applied to wound bed prior to debridement (In Clinic Only). Primary Wound Dressing: Wound #1 Right,Anterior Lower Leg: Silver Collagen Secondary Dressing: [name], [name] ([id number]) Wound #1 Right,Anterior Lower Leg: Dry Gauze - ABD to pad wound area Dressing Change Frequency: Wound #1 Right,Anterior Lower Leg: Change Dressing Monday, Wednesday, Friday Follow-up Appointments: Wound #1 Right,Anterior Lower Leg: Return Appointment in 1 week. Edema Control: Wound #1 Right,Anterior Lower Leg: 3 Layer Compression System - Right Lower Extremity Home Health: Wound #1 Right,Anterior Lower Leg: Continue [facility] Health Visits - [facility] [facility] Nurse may visit PRN to address patient s wound care needs. FACE TO FACE ENCOUNTER: MEDICARE and MEDICAID PATIENTS: I certify that this patient is under my care and that I had a  face-to-face encounter that meets the physician face-to-face encounter requirements with this patient on this date. The encounter with the patient was in whole or in part for the following MEDICAL CONDITION: (primary reason for Home Healthcare) MEDICAL NECESSITY: I certify, that based on my findings, NURSING services are a medically necessary home health service. HOME BOUND STATUS: I certify that my clinical findings support that this patient is homebound (i.e., Due to illness or injury, pt requires aid of supportive devices such as crutches, cane, wheelchairs, walkers, the use of special transportation or the assistance of another person to leave their place of residence. There is a normal inability to leave the home and doing so requires considerable and taxing effort. Other absences are for medical  reasons / religious services and are infrequent or of short duration when for other reasons). If current dressing causes regression in wound condition, may D/C ordered dressing product/s and apply Normal Saline Moist Dressing daily until next [facility]aling [facility] / Other MD appointment. Notify [facility] of regression in wound condition at [phone]. Please direct any NON-WOUND related issues/requests for orders to patient's Primary Care Physician Medications-please add to medication list.: Wound #1 Right,Anterior Lower Leg: P.O. Antibiotics 1. He requires no further antibiotics 2. Continue silver collagen ABDs and 3 layer compression. Electronic Signature(s) Signed: 05/05/2019 4:01:30 PM By: Baltazar Najjarobson,  MD Entered By: Baltazar Najjarobson,  on 05/05/2019 13:46:59 [name], [name] ([id number]) -------------------------------------------------------------------------------- SuperBill Details Patient Name: [name], Eliodoro A. Date of Service: 05/05/2019 Medical Record Number: [id number] Patient Account Number: [id number] Date of Birth/Sex: 10-14-1931 (85 y.o. M) Treating RN: Arnette NorrisBiell,  Kristina Primary Care Provider: Marcelino DusterJOHNSTON, JOHN Other Clinician: Referring Provider: Marcelino DusterJOHNSTON, JOHN Treating Provider/Extender: Altamese CarolinaOBSON,  G Weeks in Treatment: 2 Diagnosis Coding ICD-10 Codes Code Description [phone] Chronic venous hypertension (idiopathic) with ulcer and inflammation of right lower extremity L97.811 Non-pressure chronic ulcer of other part of right lower leg limited to breakdown of skin L03.115 Cellulitis of right lower limb Facility Procedures CPT4 Code Description: [id number] 11042 - DEB SUBQ TISSUE 20 SQ CM/< ICD-10 Diagnosis Description L97.811 Non-pressure chronic ulcer of other part of right lower leg lim Modifier: ited to breakdow Quantity: 1 n of skin Physician Procedures CPT4 Code Description: [id number] 11042 - WC PHYS SUBQ TISS 20 SQ CM ICD-10 Diagnosis Description L97.811 Non-pressure chronic ulcer of other part of right lower leg lim Modifier: ited to breakdown Quantity: 1 of skin Electronic Signature(s) Signed: 05/05/2019 4:01:30 PM By: Baltazar Najjarobson,  MD Entered By: Baltazar Najjarobson,  on 05/05/2019 13:47:15

## 2019-05-12 ENCOUNTER — Other Ambulatory Visit: Payer: Self-pay

## 2019-05-12 ENCOUNTER — Encounter: Payer: Medicare Other | Admitting: Internal Medicine

## 2019-05-12 DIAGNOSIS — I87331 Chronic venous hypertension (idiopathic) with ulcer and inflammation of right lower extremity: Secondary | ICD-10-CM | POA: Diagnosis not present

## 2019-05-13 NOTE — Progress Notes (Signed)
[name], Thien A. ([id number]) Visit Report for 05/12/2019 Arrival Information Details Patient Name: [name], [name] Date of Service: 05/12/2019 2:45 PM Medical Record Number: [id number] Patient Account Number: [id number] Date of Birth/Sex: 1931-03-01 (85 y.o. M) Treating RN: Arnette NorrisBiell, Kristina Primary Care Kaitlynn Tramontana: Marcelino DusterJOHNSTON, JOHN Other Clinician: Referring Skylene Deremer: Marcelino DusterJOHNSTON, JOHN Treating Fayrene Towner/Extender: Altamese CarolinaOBSON, MICHAEL G Weeks in Treatment: 3 Visit Information History Since Last Visit Added or deleted any medications: No Patient Arrived: Ambulatory Any new allergies or adverse reactions: No Arrival Time: 14:52 Had a fall or experienced change in No Accompanied By: self activities of daily living that may affect Transfer Assistance: None risk of falls: Patient Identification Verified: Yes Signs or symptoms of abuse/neglect since last visito No Secondary Verification Process Yes Hospitalized since last visit: No Completed: Has Dressing in Place as Prescribed: Yes Patient Has Alerts: Yes Has Compression in Place as Prescribed: Yes Patient Alerts: Patient on Blood Pain Present Now: No Thinner Warfarin Electronic Signature(s) Signed: 05/12/2019 4:39:36 PM By: Arnette NorrisBiell, Kristina Entered By: Arnette NorrisBiell, Kristina on 05/12/2019 14:53:54 Staton, Benaiah A. ([id number]) -------------------------------------------------------------------------------- Lower Extremity Assessment Details Patient Name: [name], [name] Date of Service: 05/12/2019 2:45 PM Medical Record Number: [id number] Patient Account Number: [id number] Date of Birth/Sex: 1931-03-01 (85 y.o. M) Treating RN: Arnette NorrisBiell, Kristina Primary Care Jakylah Bassinger: Marcelino DusterJOHNSTON, JOHN Other Clinician: Referring Dyasia Firestine: Marcelino DusterJOHNSTON, JOHN Treating Jereme Loren/Extender: Altamese CarolinaOBSON, MICHAEL G Weeks in Treatment: 3 Edema Assessment Assessed: [Left: No] [Right: No] [Left: Edema] [Right: :] Calf Left: Right: Point of Measurement: 36 cm From Medial Instep cm  36.5 cm Ankle Left: Right: Point of Measurement: 12 cm From Medial Instep cm 21.5 cm Vascular Assessment Pulses: Dorsalis Pedis Palpable: [Right:Yes] Posterior Tibial Palpable: [Right:Yes] Electronic Signature(s) Signed: 05/12/2019 4:39:36 PM By: Arnette NorrisBiell, Kristina Entered By: Arnette NorrisBiell, Kristina on 05/12/2019 15:09:39 [name], [name] ([id number]) -------------------------------------------------------------------------------- Multi Wound Chart Details Patient Name: [name], [name] Date of Service: 05/12/2019 2:45 PM Medical Record Number: [id number] Patient Account Number: [id number] Date of Birth/Sex: 1931-03-01 (85 y.o. M) Treating RN: Huel CoventryWoody, [name] Primary Care Lesly Pontarelli: Marcelino DusterJOHNSTON, JOHN Other Clinician: Referring Kashden Deboy: Marcelino DusterJOHNSTON, JOHN Treating Kameelah Minish/Extender: Altamese CarolinaOBSON, MICHAEL G Weeks in Treatment: 3 Vital Signs Height(in): 71 Pulse(bpm): 56 Weight(lbs): 195 Blood Pressure(mmHg): 149/63 Body Mass Index(BMI): 27 Temperature(F): 97.6 Respiratory Rate 18 (breaths/min): Photos: [1:No Photos] [N/A:N/A] Wound Location: [1:Right Lower Leg - Anterior] [N/A:N/A] Wounding Event: [1:Gradually Appeared] [N/A:N/A] Primary Etiology: [1:Venous Leg Ulcer] [N/A:N/A] Comorbid History: [1:Cataracts, Coronary Artery Disease, Hypertension, Osteoarthritis] [N/A:N/A] Date Acquired: [1:03/22/2019] [N/A:N/A] Weeks of Treatment: [1:3] [N/A:N/A] Wound Status: [1:Open] [N/A:N/A] Measurements L x W x D [1:0.9x0.9x0.1] [N/A:N/A] (cm) Area (cm) : [1:0.636] [N/A:N/A] Volume (cm) : [1:0.064] [N/A:N/A] % Reduction in Area: [1:26.40%] [N/A:N/A] % Reduction in Volume: [1:25.60%] [N/A:N/A] Classification: [1:Full Thickness Without Exposed Support Structures] [N/A:N/A] Exudate Amount: [1:Medium] [N/A:N/A] Exudate Type: [1:Serous] [N/A:N/A] Exudate Color: [1:amber] [N/A:N/A] Wound Margin: [1:Flat and Intact] [N/A:N/A] Granulation Amount: [1:Medium (34-66%)] [N/A:N/A] Granulation Quality: [1:Red]  [N/A:N/A] Necrotic Amount: [1:Medium (34-66%)] [N/A:N/A] Exposed Structures: [1:Fat Layer (Subcutaneous Tissue) Exposed: Yes Fascia: No Tendon: No Muscle: No Joint: No Bone: No] [N/A:N/A] Epithelialization: [1:None] [N/A:N/A] Debridement: [1:Debridement - Excisional] [N/A:N/A] Pre-procedure [1:13:22] [N/A:N/A] Verification/Time Out Taken: Pain Control: [1:Lidocaine] [N/A:N/A] Tissue Debrided: Subcutaneous N/A N/A Level: Skin/Subcutaneous Tissue N/A N/A Debridement Area (sq cm): 0.81 N/A N/A Instrument: Curette N/A N/A Bleeding: Minimum N/A N/A Hemostasis Achieved: Pressure N/A N/A Debridement Treatment Procedure was tolerated well N/A N/A Response: Post Debridement 0.4x0.6x0.1 N/A N/A Measurements L x W x D (cm) Post Debridement Volume: 0.019 N/A N/A (cm) Procedures Performed: Debridement N/A  N/A Treatment Notes Wound #1 (Right, Anterior Lower Leg) Notes prisma, abd and 3 layer wrap Electronic Signature(s) Signed: 05/12/2019 6:03:55 PM By: Linton Ham MD Entered By: Linton Ham on 05/12/2019 16:35:03 [name] Bridge A. ([id number]) -------------------------------------------------------------------------------- Wolverine Details Patient Name: [name] Bridge A. Date of Service: 05/12/2019 2:45 PM Medical Record Number: [id number] Patient Account Number: [id number] Date of Birth/Sex: 28-Oct-1931 (85 y.o. M) Treating RN: Cornell Barman Primary Care Miran Kautzman: Harrel Lemon Other Clinician: Referring Erlinda Solinger: Harrel Lemon Treating Tamryn Popko/Extender: Tito Dine in Treatment: 3 Active Inactive Abuse / Safety / Falls / Self Care Management Nursing Diagnoses: Potential for falls Goals: Patient will not experience any injury related to falls Date Initiated: 04/21/2019 Target Resolution Date: 04/21/2019 Goal Status: Active Interventions: Podiatry chair, stretcher in low position and side rails up as needed Notes: Venous Leg Ulcer Nursing  Diagnoses: Knowledge deficit related to disease process and management Goals: Verify adequate tissue perfusion prior to therapeutic compression application Date Initiated: 04/21/2019 Target Resolution Date: 04/21/2019 Goal Status: Active Interventions: Provide education on venous insufficiency Treatment Activities: Therapeutic compression applied : 04/21/2019 Notes: Wound/Skin Impairment Nursing Diagnoses: Impaired tissue integrity Goals: Ulcer/skin breakdown will have a volume reduction of 30% by week 4 Date Initiated: 04/21/2019 Target Resolution Date: 05/22/2019 Goal Status: Active [name], [name] ([id number]) Interventions: Assess ulceration(s) every visit Treatment Activities: Skin care regimen initiated : 04/21/2019 Notes: Electronic Signature(s) Signed: 05/13/2019 4:58:07 PM By: Gretta Cool, BSN, RN, CWS, [name] RN, BSN Entered By: Gretta Cool, BSN, RN, CWS, [name] on 05/12/2019 15:24:15 [name] Bridge A. ([id number]) -------------------------------------------------------------------------------- Pain Assessment Details Patient Name: [name] Bridge A. Date of Service: 05/12/2019 2:45 PM Medical Record Number: [id number] Patient Account Number: [id number] Date of Birth/Sex: 10-06-1931 (85 y.o. M) Treating RN: Harold Barban Primary Care Janasha Barkalow: Harrel Lemon Other Clinician: Referring Karesa Maultsby: Harrel Lemon Treating Denario Bagot/Extender: Tito Dine in Treatment: 3 Active Problems Location of Pain Severity and Description of Pain Patient Has Paino No Site Locations Pain Management and Medication Current Pain Management: Electronic Signature(s) Signed: 05/12/2019 4:39:36 PM By: Harold Barban Entered By: Harold Barban on 05/12/2019 14:55:29 [name], [name] ([id number]) -------------------------------------------------------------------------------- Patient/Caregiver Education Details Patient Name: [name] Bridge A. Date of Service: 05/12/2019 2:45 PM Medical Record  Number: [id number] Patient Account Number: [id number] Date of Birth/Gender: Jul 08, 1931 (85 y.o. M) Treating RN: Cornell Barman Primary Care Physician: Harrel Lemon Other Clinician: Referring Physician: Harrel Lemon Treating Physician/Extender: Tito Dine in Treatment: 3 Education Assessment Education Provided To: Patient Education Topics Provided Venous: Handouts: Controlling Swelling with Multilayered Compression Wraps Methods: Demonstration, Explain/Verbal Responses: State content correctly Wound/Skin Impairment: Handouts: Caring for Your Ulcer Methods: Demonstration, Explain/Verbal Responses: State content correctly Electronic Signature(s) Signed: 05/13/2019 4:58:07 PM By: Gretta Cool, BSN, RN, CWS, [name] RN, BSN Entered By: Gretta Cool, BSN, RN, CWS, [name] on 05/12/2019 15:32:15 [name] Bridge A. ([id number]) -------------------------------------------------------------------------------- Wound Assessment Details Patient Name: [name] Bridge A. Date of Service: 05/12/2019 2:45 PM Medical Record Number: [id number] Patient Account Number: [id number] Date of Birth/Sex: 1931-11-09 (85 y.o. M) Treating RN: Harold Barban Primary Care Pressley Barsky: Harrel Lemon Other Clinician: Referring Alexina Niccoli: Harrel Lemon Treating Henry Demeritt/Extender: Tito Dine in Treatment: 3 Wound Status Wound Number: 1 Primary Venous Leg Ulcer Etiology: Wound Location: Right Lower Leg - Anterior Wound Status: Open Wounding Event: Gradually Appeared Comorbid Cataracts, Coronary Artery Disease, Date Acquired: 03/22/2019 History: Hypertension, Osteoarthritis Weeks Of Treatment: 3 Clustered Wound: No Wound Measurements Length: (cm) 0.9 Width: (cm) 0.9 Depth: (cm) 0.1 Area: (cm) 0.636 Volume: (cm) 0.064 % Reduction in  Area: 26.4% % Reduction in Volume: 25.6% Epithelialization: None Tunneling: No Undermining: No Wound Description Full Thickness Without Exposed  Support Classification: Structures Wound Margin: Flat and Intact Exudate Medium Amount: Exudate Type: Serous Exudate Color: amber Foul Odor After Cleansing: No Slough/Fibrino Yes Wound Bed Granulation Amount: Medium (34-66%) Exposed Structure Granulation Quality: Red Fascia Exposed: No Necrotic Amount: Medium (34-66%) Fat Layer (Subcutaneous Tissue) Exposed: Yes Necrotic Quality: Adherent Slough Tendon Exposed: No Muscle Exposed: No Joint Exposed: No Bone Exposed: No Treatment Notes Wound #1 (Right, Anterior Lower Leg) Notes prisma, abd and 3 layer wrap Electronic Signature(s) Signed: 05/12/2019 4:39:36 PM By: Arnette NorrisBiell, Kristina Entered By: Arnette NorrisBiell, Kristina on 05/12/2019 15:07:52 [name], [name] ([id number]) -------------------------------------------------------------------------------- Vitals Details Patient Name: [name], [name] Date of Service: 05/12/2019 2:45 PM Medical Record Number: [id number] Patient Account Number: [id number] Date of Birth/Sex: 09-18-1931 (85 y.o. M) Treating RN: Arnette NorrisBiell, Kristina Primary Care Espiridion Supinski: Marcelino DusterJOHNSTON, JOHN Other Clinician: Referring Bradin Mcadory: Marcelino DusterJOHNSTON, JOHN Treating Kahlil Cowans/Extender: Altamese CarolinaOBSON, MICHAEL G Weeks in Treatment: 3 Vital Signs Time Taken: 14:55 Temperature (F): 97.6 Height (in): 71 Pulse (bpm): 56 Weight (lbs): 195 Respiratory Rate (breaths/min): 18 Body Mass Index (BMI): 27.2 Blood Pressure (mmHg): 149/63 Reference Range: 80 - 120 mg / dl Electronic Signature(s) Signed: 05/12/2019 4:39:36 PM By: Arnette NorrisBiell, Kristina Entered By: Arnette NorrisBiell, Kristina on 05/12/2019 14:58:32

## 2019-05-13 NOTE — Progress Notes (Signed)
[name], Cobin A. ([id number]) Visit Report for 05/12/2019 Debridement Details Patient Name: [name], Jayren A. Date of Service: 05/12/2019 2:45 PM Medical Record Number: [id number] Patient Account Number: [id number] Date of Birth/Sex: 01-03-1931 (85 y.o. M) Treating RN: Huel CoventryWoody, [name] Primary Care Provider: Marcelino DusterJOHNSTON, JOHN Other Clinician: Referring Provider: Marcelino DusterJOHNSTON, JOHN Treating Provider/Extender: Altamese CarolinaOBSON, Alfonzo Arca G Weeks in Treatment: 3 Debridement Performed for Wound #1 Right,Anterior Lower Leg Assessment: Performed By: Physician Maxwell CaulOBSON, Dezman Granda G, MD Debridement Type: Debridement Severity of Tissue Pre Fat layer exposed Debridement: Level of Consciousness (Pre- Awake and Alert procedure): Pre-procedure Verification/Time Yes - 13:22 Out Taken: Start Time: 13:23 Pain Control: Lidocaine Total Area Debrided (L x W): 0.9 (cm) x 0.9 (cm) = 0.81 (cm) Tissue and other material Viable, Subcutaneous, Skin: Dermis debrided: Level: Skin/Subcutaneous Tissue Debridement Description: Excisional Instrument: Curette Bleeding: Minimum Hemostasis Achieved: Pressure End Time: 15:26 Response to Treatment: Procedure was tolerated well Level of Consciousness Awake and Alert (Post-procedure): Post Debridement Measurements of Total Wound Length: (cm) 0.4 Width: (cm) 0.6 Depth: (cm) 0.1 Volume: (cm) 0.019 Character of Wound/Ulcer Post Debridement: Stable Severity of Tissue Post Debridement: Fat layer exposed Post Procedure Diagnosis Same as Pre-procedure Electronic Signature(s) Signed: 05/12/2019 6:03:55 PM By: Baltazar Najjarobson, Rocquel Askren MD Signed: 05/13/2019 4:58:07 PM By: Elliot GurneyWoody, BSN, RN, CWS, [name] RN, BSN Entered By: Baltazar Najjarobson, Latashia Koch on 05/12/2019 16:39:28 [name], Bashir A. ([id number]) -------------------------------------------------------------------------------- HPI Details Patient Name: [name], Vernis A. Date of Service: 05/12/2019 2:45 PM Medical Record Number: [id number] Patient Account  Number: [id number] Date of Birth/Sex: 01-03-1931 (85 y.o. M) Treating RN: Huel CoventryWoody, [name] Primary Care Provider: Marcelino DusterJOHNSTON, JOHN Other Clinician: Referring Provider: Marcelino DusterJOHNSTON, JOHN Treating Provider/Extender: Altamese CarolinaOBSON, Ziona Wickens G Weeks in Treatment: 3 History of Present Illness HPI Description: 04/21/2019 ADMISSION This is an independent 85 year old man who [place] the [place] [address] He has a history of chronic lower extremity edema and wears compression stockings. He states about a month ago he was taking off the one on the right and pulled some skin off accidentally. He has had 2 small wounds on the right anterior and right medial lower extremity. [facility] had multiple venous ultrasounds in 2015 and 16. These were DVT rule outs. He did have a Baker's cyst on the left. He has not not had a previous wound history although he did have a history of cellulitis in his legs. Past medical history; includes aortic stenosis status post mechanical AVR in 2007 on chronic Coumadin, lower extremity edema with a history of cellulitis, history of MRSA. ABIs in our clinic were 1.1 on the right 6/3; patient with predominantly venous insufficiency ulcers in the right lower leg probably some degree of lymphedema. He had to wounds last week. We put him in 3 layer compression. [facility] [place] [facility]. The area laterally is healed but he still has a small very painful area on the anterior tibial area. He is on Coumadin because of mechanical aortic valve. 6/10; venous insufficiency ulcers on the right lower leg. He has some degree of lymphedema. We have been using 3 layer compression with silver collagen small wound. Dimensions are improved. I gave him doxycycline last week which he tolerated because of surrounding erythema. This is improved also 6/17; arrives in clinic today with portable silver collagen dressing  tightly adherent to the wound. Also felt that the wrap was too tight 3 [place] Electronic Signature(s) Signed: 05/12/2019 6:03:55 PM By:  Linton Ham MD Entered By: Linton Ham on 05/12/2019 16:36:07 Fajardo, [place] ([id number]) -------------------------------------------------------------------------------- Physical Exam Details Patient Name: Sharren Bridge A. Date of Service: 05/12/2019 2:45 PM Medical Record Number: [id number] Patient Account Number: [id number] Date of Birth/Sex: [birth year] (85 y.o. M) Treating RN: Cornell Barman Primary Care Provider: Harrel Lemon Other Clinician: Referring Provider: Harrel Lemon Treating Provider/Extender: Tito Dine in Treatment: 3 Constitutional Patient is hypertensive.. Pulse regular and within target range for patient.Marland Kitchen Respirations regular, non-labored and within target range.. Temperature is normal and within the target range for the patient.Marland Kitchen appears in no distress. Cardiovascular Pedal pulses palpable. Notes Wound exam; distal right lower leg. 1 remaining wound. Using a #5 curette I removed tightly adherent silver alginate and some necrotic debris from the wound surface. This was done with some difficulty. Hemostasis with direct pressure. There is no evidence of surrounding infection Electronic Signature(s) Signed: 05/12/2019 6:03:55 PM By: Linton Ham MD Entered By: Linton Ham on 05/12/2019 16:38:47 Hadley Pen ([id number]) -------------------------------------------------------------------------------- Physician Orders Details Patient Name: Sharren Bridge A. Date of Service: 05/12/2019 2:45 PM Medical Record Number: [id number] Patient Account Number: [id number] Date of Birth/Sex: 11-26-1930 (85 y.o. M) Treating RN: Cornell Barman Primary Care Provider: Harrel Lemon Other Clinician: Referring Provider: Harrel Lemon Treating Provider/Extender: Tito Dine in Treatment: 3 Verbal / Phone Orders: No Diagnosis Coding Wound Cleansing Wound #1 Right,Anterior Lower Leg o May Shower, gently pat wound dry prior to applying new dressing. - Cast protector to keep wrap dry. Anesthetic (add to Medication List) Wound #1 Right,Anterior Lower Leg o Topical Lidocaine 4% cream applied to wound bed prior to debridement (In Clinic Only). o Benzocaine Topical Anesthetic Spray applied to wound bed prior to debridement (In Clinic Only). Primary Wound Dressing Wound #1 Right,Anterior Lower Leg o Silver Collagen - moisten with saline or [place] Jelly before applying to wound Secondary Dressing Wound #1 Right,Anterior Lower Leg o ABD pad Dressing Change Frequency Wound #1 Right,Anterior Lower Leg o Change Dressing Monday, Wednesday, Friday Follow-up Appointments Wound #1 Right,Anterior Lower Leg o Return Appointment in 1 week. Edema Control Wound #1 Right,Anterior Lower Leg o 3 Layer Compression System - Right Lower Extremity - VOB to wrap patient. Do not wrap tight! Pull Coban and 1/2 stretch, Not as tight as it will go. Call wound care center for instructions if needed. Electronic Signature(s) Signed: 05/12/2019 6:03:55 PM By: Linton Ham MD Signed: 05/13/2019 4:58:07 PM By: Gretta Cool, BSN, RN, CWS, [name] RN, BSN Entered By: Gretta Cool, BSN, RN, CWS, [name] on 05/12/2019 15:31:41 Hadley Pen ([id number]) -------------------------------------------------------------------------------- Problem List Details Patient Name: KEIL, PICKERING A. Date of Service: 05/12/2019 2:45 PM Medical Record Number: [id number] Patient Account Number: [id number] Date of Birth/Sex: [birth year] (85 y.o. M) Treating RN: Cornell Barman Primary Care Provider: Harrel Lemon Other Clinician: Referring Provider: Harrel Lemon Treating Provider/Extender: Tito Dine in Treatment: 3 Active Problems ICD-10 Evaluated Encounter Code Description Active Date Today  Diagnosis I87.331 Chronic venous hypertension (idiopathic) with ulcer and 04/21/2019 No Yes inflammation of right lower extremity L97.811 Non-pressure chronic ulcer of other part of right lower leg 04/21/2019 No Yes limited to breakdown of skin L03.115 Cellulitis of right lower limb 04/28/2019 No Yes Inactive Problems Resolved Problems Electronic Signature(s) Signed: 05/12/2019 6:03:55 PM By: Linton Ham MD Entered By: Linton Ham on 05/12/2019 16:34:56 [place], [place]. ([id number]) -------------------------------------------------------------------------------- Progress Note Details Patient Name: Sharren Bridge A. Date of Service: 05/12/2019 2:45 PM Medical Record Number: [id number] Patient Account Number: [id number] Date of  Birth/Sex: Feb 09, 1931 (85 y.o. M) Treating RN: Huel CoventryWoody, [name] Primary Care Provider: Marcelino DusterJOHNSTON, JOHN Other Clinician: Referring Provider: Marcelino DusterJOHNSTON, JOHN Treating Provider/Extender: Altamese CarolinaOBSON, August Longest G Weeks in Treatment: 3 Subjective History of Present Illness (HPI) 04/21/2019 ADMISSION This is an independent 85 year old man who [place] in the independent part [place] [address]. He has a history of chronic lower extremity edema and wears compression stockings. He states about a month ago he was taking off the one on the right and pulled some skin off accidentally. He has had 2 small wounds on the right anterior and right medial lower extremity. [facility] that he had multiple venous ultrasounds in 2015 and 16. These were DVT rule outs. He did have a Baker's cyst on the left. He has not not had a previous wound history although he did have a history of cellulitis in his legs. Past medical history; includes aortic stenosis status post mechanical AVR in 2007 on chronic Coumadin, lower extremity edema with a history of cellulitis, history of MRSA. ABIs in our clinic were 1.1 on the right 6/3; patient with predominantly venous  insufficiency ulcers in the right lower leg probably some degree of lymphedema. He had to wounds last week. We put him in 3 layer compression. [facility] [place] [facility] the dressing. The area laterally is healed but he still has a small very painful area on the anterior tibial area. He is on Coumadin because of mechanical aortic valve. 6/10; venous insufficiency ulcers on the right lower leg. He has some degree of lymphedema. We have been using 3 layer compression with silver collagen small wound. Dimensions are improved. I gave him doxycycline last week which he tolerated because of surrounding erythema. This is improved also 6/17; arrives in clinic today with portable silver collagen dressing tightly adherent to the wound. Also felt that the wrap was too tight 3 [facility] [place] Objective Constitutional Patient is hypertensive.. Pulse regular and within target range for patient.Marland Kitchen. Respirations regular, non-labored and within target range.. Temperature is normal and within the target range for the patient.Marland Kitchen. appears in no distress. Vitals Time Taken: 2:55 PM, Height: 71 in, Weight: 195 lbs, BMI: 27.2, Temperature: 97.6 F, Pulse: 56 bpm, Respiratory Rate: 18 breaths/min, Blood Pressure: 149/63 mmHg. Cardiovascular Pedal pulses palpable. Mcmurphy, Daanish A. ([id number]) General Notes: Wound exam; distal right lower leg. 1 remaining wound. Using a #5 curette I removed tightly adherent silver alginate and some necrotic debris from the wound surface. This was done with some difficulty. Hemostasis with direct pressure. There is no evidence of surrounding infection Integumentary (Hair, Skin) Wound #1 status is Open. Original cause of wound was Gradually Appeared. The wound is located on the Right,Anterior Lower Leg. The wound measures 0.9cm length x 0.9cm width x 0.1cm depth; 0.636cm^2 area and 0.064cm^3 volume. There is Fat Layer (Subcutaneous  Tissue) Exposed exposed. There is no tunneling or undermining noted. There is a medium amount of serous drainage noted. The wound margin is flat and intact. There is medium (34-66%) red granulation within the wound bed. There is a medium (34-66%) amount of necrotic tissue within the wound bed including Adherent Slough. Assessment Active Problems ICD-10 Chronic venous hypertension (idiopathic) with ulcer and inflammation of right lower extremity Non-pressure chronic ulcer of other part of right lower leg limited to breakdown of skin Cellulitis of right lower limb Procedures Wound #1 Pre-procedure diagnosis of Wound #1 is a Venous Leg Ulcer located  on the Right,Anterior Lower Leg .Severity of Tissue Pre Debridement is: Fat layer exposed. There was a Excisional Skin/Subcutaneous Tissue Debridement with a total area of 0.81 sq cm performed by Maxwell CaulOBSON, Shihab States G, MD. With the following instrument(s): Curette to remove Viable tissue/material. Material removed includes Subcutaneous Tissue and Skin: Dermis and after achieving pain control using Lidocaine. No specimens were taken. A time out was conducted at 13:22, prior to the start of the procedure. A Minimum amount of bleeding was controlled with Pressure. The procedure was tolerated well. Post Debridement Measurements: 0.4cm length x 0.6cm width x 0.1cm depth; 0.019cm^3 volume. Character of Wound/Ulcer Post Debridement is stable. Severity of Tissue Post Debridement is: Fat layer exposed. Post procedure Diagnosis Wound #1: Same as Pre-Procedure Plan Wound Cleansing: Wound #1 Right,Anterior Lower Leg: May Shower, gently pat wound dry prior to applying new dressing. - Cast protector to keep wrap dry. Anesthetic (add to Medication List): Wound #1 Right,Anterior Lower Leg: Topical Lidocaine 4% cream applied to wound bed prior to debridement (In Clinic Only). Benzocaine Topical Anesthetic Spray applied to wound bed prior to debridement (In Clinic  Only). Primary Wound Dressing: Wound #1 Right,Anterior Lower Leg: Crigger, Neftaly A. ([id number]) Silver Collagen - moisten with saline or [place] Jelly before applying to wound Secondary Dressing: Wound #1 Right,Anterior Lower Leg: ABD pad Dressing Change Frequency: Wound #1 Right,Anterior Lower Leg: Change Dressing Monday, Wednesday, Friday Follow-up Appointments: Wound #1 Right,Anterior Lower Leg: Return Appointment in 1 week. Edema Control: Wound #1 Right,Anterior Lower Leg: 3 Layer Compression System - Right Lower Extremity - VOB to wrap patient. Do not wrap tight! Pull Coban and 1/2 stretch, Not as tight as it will go. Call wound care center for instructions if needed. 1. Moistened silver collagen/ABDs 3 layer compression. Electronic Signature(s) Signed: 05/12/2019 6:03:55 PM By: Baltazar Najjarobson, Albirta Rhinehart MD Entered By: Baltazar Najjarobson, Berma Harts on 05/12/2019 16:40:13 [name], Gildo A. ([id number]) -------------------------------------------------------------------------------- SuperBill Details Patient Name: [name], Javari A. Date of Service: 05/12/2019 Medical Record Number: [id number] Patient Account Number: [id number] Date of Birth/Sex: 30-Dec-1930 (85 y.o. M) Treating RN: Huel CoventryWoody, [name] Primary Care Provider: Marcelino DusterJOHNSTON, JOHN Other Clinician: Referring Provider: Marcelino DusterJOHNSTON, JOHN Treating Provider/Extender: Altamese CarolinaOBSON, Kellyjo Edgren G Weeks in Treatment: 3 Diagnosis Coding ICD-10 Codes Code Description [phone] Chronic venous hypertension (idiopathic) with ulcer and inflammation of right lower extremity L97.811 Non-pressure chronic ulcer of other part of right lower leg limited to breakdown of skin L03.115 Cellulitis of right lower limb Facility Procedures CPT4: Description Modifier Quantity Code [id number] 11042 - DEB SUBQ TISSUE 20 SQ CM/< 1 ICD-10 Diagnosis Description I87.331 Chronic venous hypertension (idiopathic) with ulcer and inflammation of right lower extremity Physician Procedures CPT4: Description  Modifier Quantity Code [id number] 11042 - WC PHYS SUBQ TISS 20 SQ CM 1 ICD-10 Diagnosis Description I87.331 Chronic venous hypertension (idiopathic) with ulcer and inflammation of right lower extremity Electronic Signature(s) Signed: 05/12/2019 6:03:55 PM By: Baltazar Najjarobson, Savon Cobbs MD Entered By: Baltazar Najjarobson, Makailee Nudelman on 05/12/2019 16:40:32

## 2019-05-19 ENCOUNTER — Other Ambulatory Visit: Payer: Self-pay

## 2019-05-19 ENCOUNTER — Encounter: Payer: Medicare Other | Admitting: Internal Medicine

## 2019-05-19 DIAGNOSIS — I87331 Chronic venous hypertension (idiopathic) with ulcer and inflammation of right lower extremity: Secondary | ICD-10-CM | POA: Diagnosis not present

## 2019-05-19 NOTE — Telephone Encounter (Signed)
error 

## 2019-05-21 ENCOUNTER — Telehealth: Payer: Self-pay

## 2019-05-21 NOTE — Telephone Encounter (Signed)

## 2019-05-22 NOTE — Progress Notes (Signed)
[name], [name] ([id number]) Visit Report for 05/19/2019 Debridement Details Patient Name: [name], [name] Date of Service: 05/19/2019 2:00 PM Medical Record Number: [id number] Patient Account Number: [id number] Date of Birth/Sex: 12/25/1930 (85 y.o. M) Treating RN: [name] Primary Care Provider: Marcelino DusterJOHNSTON, [name] Other Clinician: Referring Provider: Marcelino DusterJOHNSTON, [name] Treating Provider/Extender: [name] [name] in Treatment: 4 Debridement Performed for Wound #1 Right,Anterior Lower Leg Assessment: Performed By: Physician [name] Debridement Type: Debridement Severity of Tissue Pre Fat layer exposed Debridement: Level of Consciousness (Pre- Awake and Alert procedure): Pre-procedure Verification/Time Yes - 14:18 Out Taken: Start Time: 14:18 Pain Control: Lidocaine Total Area Debrided (L x W): 0.6 (cm) x 0.5 (cm) = 0.3 (cm) Tissue and other material Viable, Slough, Subcutaneous, Slough debrided: Level: Skin/Subcutaneous Tissue Debridement Description: Excisional Instrument: Curette Bleeding: Minimum Hemostasis Achieved: Pressure End Time: 14:18 Response to Treatment: Procedure was tolerated well Level of Consciousness Awake and Alert (Post-procedure): Post Debridement Measurements of Total Wound Length: (cm) 0.6 Width: (cm) 0.5 Depth: (cm) 0.1 Volume: (cm) 0.024 Character of Wound/Ulcer Post Debridement: Stable Severity of Tissue Post Debridement: Fat layer exposed Post Procedure Diagnosis Same as Pre-procedure Electronic Signature(s) Signed: 05/19/2019 4:13:17 PM By: [name], [name] Cuny [name] Signed: 05/21/2019 4:38:11 PM By: [name] GurneyWoody, BSN, RN, CWS, [name] RN, BSN Entered By: [name], Ishaan Villamar on 05/19/2019 14:24:39 Leija, [name] ([id number]) -------------------------------------------------------------------------------- HPI Details Patient Name: [name], [name] Date of Service: 05/19/2019 2:00 PM Medical Record Number: [id number] Patient Account  Number: [id number] Date of Birth/Sex: 12/25/1930 (85 y.o. M) Treating RN: [name] Primary Care Provider: Marcelino DusterJOHNSTON, [name] Other Clinician: Referring Provider: Marcelino DusterJOHNSTON, [name] Treating Provider/Extender: [name] CarolinaOBSON, [name] [name] in Treatment: 4 History of Present Illness HPI Description: 04/21/2019 ADMISSION This is an independent 85 year old man who lives in the independent part of [address]. He has a history of chronic lower extremity edema and wears compression stockings. He states about a month ago he was taking off the one on the right and pulled some skin off accidentally. He has had 2 small wounds on the right anterior and right medial lower extremity. [facility] that he had multiple venous ultrasounds in 2015 and 16. These were DVT rule outs. He did have a Baker's cyst on the left. He has not not had a previous wound history although he did have a history of cellulitis in his legs. Past medical history; includes aortic stenosis status post mechanical AVR in 2007 on chronic Coumadin, lower extremity edema with a history of cellulitis, history of MRSA. ABIs in our clinic were 1.1 on the right 6/3; patient with predominantly venous insufficiency ulcers in the right lower leg probably some degree of lymphedema. He had to wounds last week. We put him in 3 layer compression. [facility] [place] [facility] the dressing. The area laterally is healed but he still has a small very painful area on the anterior tibial area. He is on Coumadin because of mechanical aortic valve. 6/10; venous insufficiency ulcers on the right lower leg. He has some degree of lymphedema. We have been using 3 layer compression with silver collagen small wound. Dimensions are improved. I gave him doxycycline last week which he tolerated because of surrounding erythema. This is improved also 6/17; arrives in clinic today with portable silver collagen dressing  tightly adherent to the wound. Also felt that the wrap was too tight 3 [facility] [place] [facility]; patient's wound is small still adherent  debris over the surface however even under illumination it was hard to see what was still open here. We have been using silver collagen Electronic Signature(s) Signed: 05/19/2019 4:13:17 PM By: [name] Ham [name] Entered By: [name] on 05/19/2019 14:25:16 [name] Bridge A. ([id number]) -------------------------------------------------------------------------------- Physical Exam Details Patient Name: [name] Bridge A. Date of Service: 05/19/2019 2:00 PM Medical Record Number: [id number] Patient Account Number: [id number] Date of Birth/Sex: August 30, 1931 (85 y.o. M) Treating RN: [name] Primary Care Provider: Harrel Lemon Other Clinician: Referring Provider: Harrel Lemon Treating Provider/Extender: [name] in Treatment: 4 Constitutional Sitting or standing Blood Pressure is within target range for patient.. Pulse regular and within target range for patient.Marland Kitchen Respirations regular, non-labored and within target range.. Temperature is normal and within the target range for the patient.Marland Kitchen appears in no distress. Notes Wound exam; distal right lower leg in the setting of severe chronic venous insufficiency. He has a small remaining wound even under illumination it was hard to see that this was still open. I was able to identify some adherent material which I debrided with a #3 curette he tolerates this exceptionally poorly. There is seems excessive amounts of pain for the small wound. I was able to get most of the debris off the surface with difficulty. Hemostasis with gauze Electronic Signature(s) Signed: 05/19/2019 4:13:17 PM By: [name] Ham [name] Entered By: [name] on 05/19/2019 14:28:50 [name]  ([id number]) -------------------------------------------------------------------------------- Physician Orders Details Patient Name: [name] Bridge A. Date of Service: 05/19/2019 2:00 PM Medical Record Number: [id number] Patient Account Number: [id number] Date of Birth/Sex: 1931-01-18 (85 y.o. M) Treating RN: [name] Primary Care Provider: Harrel Lemon Other Clinician: Referring Provider: Harrel Lemon Treating Provider/Extender: [name] in Treatment: 4 Verbal / Phone Orders: No Diagnosis Coding Wound Cleansing Wound #1 Right,Anterior Lower Leg o May Shower, gently pat wound dry prior to applying new dressing. - Cast protector to keep wrap dry. Anesthetic (add to Medication List) Wound #1 Right,Anterior Lower Leg o Topical Lidocaine 4% cream applied to wound bed prior to debridement (In Clinic Only). o Benzocaine Topical Anesthetic Spray applied to wound bed prior to debridement (In Clinic Only). Primary Wound Dressing Wound #1 Right,Anterior Lower Leg o Hydrafera Blue Ready Transfer - mepitel one Secondary Dressing Wound #1 Right,Anterior Lower Leg o ABD pad Dressing Change Frequency Wound #1 Right,Anterior Lower Leg o Change dressing every week Follow-up Appointments Wound #1 Right,Anterior Lower Leg o Return Appointment in 1 week. Edema Control Wound #1 Right,Anterior Lower Leg o 3 Layer Compression System - Right Lower Extremity Electronic Signature(s) Signed: 05/19/2019 4:13:17 PM By: [name] Ham [name] Signed: 05/21/2019 4:38:11 PM By: Gretta Cool, BSN, RN, CWS, [name] RN, BSN Entered By: Gretta Cool, BSN, RN, CWS, [name] on 05/19/2019 14:21:47 [name] ([id number]) -------------------------------------------------------------------------------- Problem List Details Patient Name: KENROY, TIMBERMAN A. Date of Service: 05/19/2019 2:00 PM Medical Record Number: [id number] Patient Account Number: [id number] Date of Birth/Sex: [birth year] (85 y.o.  M) Treating RN: [name] Primary Care Provider: Harrel Lemon Other Clinician: Referring Provider: Harrel Lemon Treating Provider/Extender: [name] in Treatment: 4 Active Problems ICD-10 Evaluated Encounter Code Description Active Date Today Diagnosis I87.331 Chronic venous hypertension (idiopathic) with ulcer and 04/21/2019 No Yes inflammation of right lower extremity L97.811 Non-pressure chronic ulcer of other part of right lower leg 04/21/2019 No Yes limited to breakdown of skin L03.115 Cellulitis of right lower limb 04/28/2019 No Yes Inactive Problems Resolved Problems Electronic Signature(s) Signed: 05/19/2019 4:13:17 PM By: [name] Ham [name]  Entered By: [name] Najjar on 05/19/2019 14:24:13 Slinker, Seymore A. ([id number]) -------------------------------------------------------------------------------- Progress Note Details Patient Name: [name], [name] Date of Service: 05/19/2019 2:00 PM Medical Record Number: [id number] Patient Account Number: [id number] Date of Birth/Sex: [birth year] (85 y.o. M) Treating RN: [name] Coventry Primary Care Provider: [name] Other Clinician: Referring Provider: [name] Treating Provider/Extender: [name] Fullerton in Treatment: 4 Subjective History of Present Illness (HPI) 04/21/2019 ADMISSION This is an independent 85 year old man who lives in the independent part of [address] [place]. He has a history of chronic lower extremity edema and wears compression stockings. He states about a month ago he was taking off the one on the right and pulled some skin off accidentally. He has had 2 small wounds on the right anterior and right medial lower extremity. I note looking through [facility] link that he had multiple venous ultrasounds in 2015 and 16. These were DVT rule outs. He did have a Baker's cyst on the left. He has not not had a previous wound history although he did have a history of cellulitis in his  legs. Past medical history; includes aortic stenosis status post mechanical AVR in 2007 on chronic Coumadin, lower extremity edema with a history of cellulitis, history of MRSA. ABIs in our clinic were 1.1 on the right 6/3; patient with predominantly venous insufficiency ulcers in the right lower leg probably some degree of lymphedema. He had to wounds last week. We put him in 3 layer compression. The nurses at [facility] are changing the dressing. The area laterally is healed but he still has a small very painful area on the anterior tibial area. He is on Coumadin because of mechanical aortic valve. 6/10; venous insufficiency ulcers on the right lower leg. He has some degree of lymphedema. We have been using 3 layer compression with silver collagen small wound. Dimensions are improved. I gave him doxycycline last week which he tolerated because of surrounding erythema. This is improved also 6/17; arrives in clinic today with portable silver collagen dressing tightly adherent to the wound. Also felt that the wrap was too tight 3 layer being changed at the [facility] 6/24; patient's wound is small still adherent debris over the surface however even under illumination it was hard to see what was still open here. We have been using silver collagen Objective Constitutional Sitting or standing Blood Pressure is within target range for patient.. Pulse regular and within target range for patient.Marland Kitchen Respirations regular, non-labored and within target range.. Temperature is normal and within the target range for the patient.Marland Kitchen appears in no distress. Vitals Time Taken: 2:00 PM, Height: 71 in, Weight: 195 lbs, BMI: 27.2, Temperature: 98.1 F, Pulse: 56 bpm, Respiratory Rate: 16 breaths/min, Blood Pressure: 139/60 mmHg. Bonadonna, Raesean A. ([id number]) General Notes: Wound exam; distal right lower leg in the setting of severe chronic venous insufficiency. He has a small remaining wound  even under illumination it was hard to see that this was still open. I was able to identify some adherent material which I debrided with a #3 curette he tolerates this exceptionally poorly. There is seems excessive amounts of pain for the small wound. I was able to get most of the debris off the surface with difficulty. Hemostasis with gauze Integumentary (Hair, Skin) Wound #1 status is Open. Original cause of wound was Gradually Appeared. The wound is located on the Right,Anterior Lower Leg. The wound measures 0.6cm length x 0.5cm width x 0.1cm depth; 0.236cm^2 area and 0.024cm^3  volume. There is Fat Layer (Subcutaneous Tissue) Exposed exposed. There is no tunneling or undermining noted. There is a medium amount of serous drainage noted. The wound margin is flat and intact. There is medium (34-66%) red granulation within the wound bed. There is a medium (34-66%) amount of necrotic tissue within the wound bed including Adherent Slough. Assessment Active Problems ICD-10 Chronic venous hypertension (idiopathic) with ulcer and inflammation of right lower extremity Non-pressure chronic ulcer of other part of right lower leg limited to breakdown of skin Cellulitis of right lower limb Procedures Wound #1 Pre-procedure diagnosis of Wound #1 is a Venous Leg Ulcer located on the Right,Anterior Lower Leg .Severity of Tissue Pre Debridement is: Fat layer exposed. There was a Excisional Skin/Subcutaneous Tissue Debridement with a total area of 0.3 sq cm performed by [name] CaulOBSON, Yasha Tibbett [name], [name]. With the following instrument(s): Curette to remove Viable tissue/material. Material removed includes Subcutaneous Tissue and Slough and after achieving pain control using Lidocaine. No specimens were taken. A time out was conducted at 14:18, prior to the start of the procedure. A Minimum amount of bleeding was controlled with Pressure. The procedure was tolerated well. Post Debridement Measurements: 0.6cm length x 0.5cm  width x 0.1cm depth; 0.024cm^3 volume. Character of Wound/Ulcer Post Debridement is stable. Severity of Tissue Post Debridement is: Fat layer exposed. Post procedure Diagnosis Wound #1: Same as Pre-Procedure Plan Wound Cleansing: Wound #1 Right,Anterior Lower Leg: May Shower, gently pat wound dry prior to applying new dressing. - Cast protector to keep wrap dry. Anesthetic (add to Medication List): Wound #1 Right,Anterior Lower Leg: Topical Lidocaine 4% cream applied to wound bed prior to debridement (In Clinic Only). Benzocaine Topical Anesthetic Spray applied to wound bed prior to debridement (In Clinic Only). Primary Wound Dressing: [name], [name] ([id number]) Wound #1 Right,Anterior Lower Leg: Hydrafera Blue Ready Transfer - mepitel one Secondary Dressing: Wound #1 Right,Anterior Lower Leg: ABD pad Dressing Change Frequency: Wound #1 Right,Anterior Lower Leg: Change dressing every week Follow-up Appointments: Wound #1 Right,Anterior Lower Leg: Return Appointment in 1 week. Edema Control: Wound #1 Right,Anterior Lower Leg: 3 Layer Compression System - Right Lower Extremity #1 I have changed the primary dressing to Hydrofera Blue from silver collagen and we are going to leave his compression on all week. There is no evidence of surrounding infection. He has severe chronic venous insufficiency. Electronic Signature(s) Signed: 05/19/2019 4:13:17 PM By: [name], Reyes Fifield [name] Entered By: [name], Abdifatah Colquhoun on 05/19/2019 14:30:02 [name], [name] ([id number]) -------------------------------------------------------------------------------- SuperBill Details Patient Name: [name], [name] Date of Service: 05/19/2019 Medical Record Number: [id number] Patient Account Number: [id number] Date of Birth/Sex: 1930-12-12 (85 y.o. M) Treating RN: [name] Primary Care Provider: Marcelino DusterJOHNSTON, [name] Other Clinician: Referring Provider: Marcelino DusterJOHNSTON, [name] Treating Provider/Extender: [name] CarolinaOBSON, Kade Demicco  [name] [name] in Treatment: 4 Diagnosis Coding ICD-10 Codes Code Description [phone] Chronic venous hypertension (idiopathic) with ulcer and inflammation of right lower extremity L97.811 Non-pressure chronic ulcer of other part of right lower leg limited to breakdown of skin L03.115 Cellulitis of right lower limb Facility Procedures CPT4 Code Description: [id number] 11042 - DEB SUBQ TISSUE 20 SQ CM/< ICD-10 Diagnosis Description L97.811 Non-pressure chronic ulcer of other part of right lower leg lim Modifier: ited to breakdow Quantity: 1 n of skin Physician Procedures CPT4 Code Description: [id number] 11042 - WC PHYS SUBQ TISS 20 SQ CM ICD-10 Diagnosis Description L97.811 Non-pressure chronic ulcer of other part of right lower leg lim Modifier: ited to breakdown Quantity: 1 of skin Electronic Signature(s) Signed: 05/19/2019 4:13:17 PM  By: [name], Nichola Warren [name] Entered By: [name], Akeiba Axelson on 05/19/2019 14:30:20

## 2019-05-22 NOTE — Progress Notes (Signed)
TYREAK, REAGLE ([id number]) Visit Report for 05/19/2019 Arrival Information Details Patient Name: ANOTHY, BUFANO A. Date of Service: 05/19/2019 2:00 PM Medical Record Number: [id number] Patient Account Number: [id number] Date of Birth/Sex: [birth year] (85 y.o. M) Treating RN: Army Melia Primary Care Dorothea Yow: Harrel Lemon Other Clinician: Referring Myrah Strawderman: Harrel Lemon Treating Lonisha Bobby/Extender: Tito Dine in Treatment: 4 Visit Information History Since Last Visit Added or deleted any medications: No Patient Arrived: Walker Any new allergies or adverse reactions: No Arrival Time: 13:59 Had a fall or experienced change in No Accompanied By: self activities of daily living that may affect Transfer Assistance: None risk of falls: Patient Has Alerts: Yes Signs or symptoms of abuse/neglect since last visito No Patient Alerts: Patient on Blood Thinner Hospitalized since last visit: No Warfarin Implantable device outside of the clinic excluding No cellular tissue based products placed in the center since last visit: Has Dressing in Place as Prescribed: Yes Pain Present Now: No Electronic Signature(s) Signed: 05/19/2019 3:49:41 PM By: Army Melia Entered By: Army Melia on 05/19/2019 14:00:14 Sharren Bridge A. ([id number]) -------------------------------------------------------------------------------- Encounter Discharge Information Details Patient Name: Sharren Bridge A. Date of Service: 05/19/2019 2:00 PM Medical Record Number: [id number] Patient Account Number: [id number] Date of Birth/Sex: 11-14-1931 (85 y.o. M) Treating RN: Army Melia Primary Care Hector Taft: Harrel Lemon Other Clinician: Referring Tateanna Bach: Harrel Lemon Treating Harjit Douds/Extender: Tito Dine in Treatment: 4 Encounter Discharge Information Items Post Procedure Vitals Discharge Condition: Stable Temperature (F): 98.1 Ambulatory Status: Walker Pulse (bpm):  56 Discharge Destination: Home Respiratory Rate (breaths/min): 16 Transportation: Private Auto Blood Pressure (mmHg): 139/60 Accompanied By: self Schedule Follow-up Appointment: Yes Clinical Summary of Care: Electronic Signature(s) Signed: 05/19/2019 3:49:41 PM By: Army Melia Entered By: Army Melia on 05/19/2019 14:37:30 Castrellon, Yakub A. ([id number]) -------------------------------------------------------------------------------- Lower Extremity Assessment Details Patient Name: Sharren Bridge A. Date of Service: 05/19/2019 2:00 PM Medical Record Number: [id number] Patient Account Number: [id number] Date of Birth/Sex: 01-20-1931 (85 y.o. M) Treating RN: Army Melia Primary Care Kyriakos Babler: Harrel Lemon Other Clinician: Referring Jonthan Leite: Harrel Lemon Treating Abie Cheek/Extender: Tito Dine in Treatment: 4 Edema Assessment Assessed: [Left: No] [Right: No] Edema: [Left: No] [Right: No] Calf Left: Right: Point of Measurement: 36 cm From Medial Instep cm 37.3 cm Ankle Left: Right: Point of Measurement: 12 cm From Medial Instep cm 23 cm Vascular Assessment Pulses: Dorsalis Pedis Palpable: [Right:Yes] Electronic Signature(s) Signed: 05/19/2019 3:49:41 PM By: Army Melia Entered By: Army Melia on 05/19/2019 14:07:18 [place]iston, Shoreline A. ([id number]) -------------------------------------------------------------------------------- Multi Wound Chart Details Patient Name: Sharren Bridge A. Date of Service: 05/19/2019 2:00 PM Medical Record Number: [id number] Patient Account Number: [id number] Date of Birth/Sex: Nov 27, 1930 (85 y.o. M) Treating RN: Cornell Barman Primary Care Jode Lippe: Harrel Lemon Other Clinician: Referring Damia Bobrowski: Harrel Lemon Treating Arrionna Serena/Extender: Tito Dine in Treatment: 4 Vital Signs Height(in): 71 Pulse(bpm): 22 Weight(lbs): 195 Blood Pressure(mmHg): 139/60 Body Mass Index(BMI): 27 Temperature(F): 98.1 Respiratory  Rate 16 (breaths/min): Photos: [N/A:N/A] Wound Location: Right Lower Leg - Anterior N/A N/A Wounding Event: Gradually Appeared N/A N/A Primary Etiology: Venous Leg Ulcer N/A N/A Comorbid History: Cataracts, Coronary Artery N/A N/A Disease, Hypertension, Osteoarthritis Date Acquired: 03/22/2019 N/A N/A Weeks of Treatment: 4 N/A N/A Wound Status: Open N/A N/A Measurements L x W x D 0.6x0.5x0.1 N/A N/A (cm) Area (cm) : 0.236 N/A N/A Volume (cm) : 0.024 N/A N/A % Reduction in Area: 72.70% N/A N/A % Reduction in Volume: 72.10% N/A N/A Classification: Full Thickness Without N/A N/A Exposed Support Structures Exudate  Amount: Medium N/A N/A Exudate Type: Serous N/A N/A Exudate Color: amber N/A N/A Wound Margin: Flat and Intact N/A N/A Granulation Amount: Medium (34-66%) N/A N/A Granulation Quality: Red N/A N/A Necrotic Amount: Medium (34-66%) N/A N/A Exposed Structures: Fat Layer (Subcutaneous N/A N/A Tissue) Exposed: Yes Fascia: No Tendon: No Muscle: No Silbaugh, Hakeem A. ([id number]) Joint: No Bone: No Epithelialization: None N/A N/A Debridement: Debridement - Excisional N/A N/A Pre-procedure 14:18 N/A N/A Verification/Time Out Taken: Pain Control: Lidocaine N/A N/A Tissue Debrided: Subcutaneous, Slough N/A N/A Level: Skin/Subcutaneous Tissue N/A N/A Debridement Area (sq cm): 0.3 N/A N/A Instrument: Curette N/A N/A Bleeding: Minimum N/A N/A Hemostasis Achieved: Pressure N/A N/A Debridement Treatment Procedure was tolerated well N/A N/A Response: Post Debridement 0.6x0.5x0.1 N/A N/A Measurements L x W x D (cm) Post Debridement Volume: 0.024 N/A N/A (cm) Procedures Performed: Debridement N/A N/A Treatment Notes Electronic Signature(s) Signed: 05/19/2019 4:13:17 PM By: Baltazar Najjarobson, Michael MD Entered By: Baltazar Najjarobson, Michael on 05/19/2019 14:24:23 [name], Delan A.  ([id number]) -------------------------------------------------------------------------------- Multi-Disciplinary Care Plan Details Patient Name: [name], Ramar A. Date of Service: 05/19/2019 2:00 PM Medical Record Number: [id number] Patient Account Number: [id number] Date of Birth/Sex: 06/20/1931 (85 y.o. M) Treating RN: Huel CoventryWoody, [name] Primary Care Cathlin Buchan: Marcelino DusterJOHNSTON, JOHN Other Clinician: Referring Danylah Holden: Marcelino DusterJOHNSTON, JOHN Treating Arbie Blankley/Extender: Altamese CarolinaOBSON, MICHAEL G Weeks in Treatment: 4 Active Inactive Abuse / Safety / Falls / Self Care Management Nursing Diagnoses: Potential for falls Goals: Patient will not experience any injury related to falls Date Initiated: 04/21/2019 Target Resolution Date: 04/21/2019 Goal Status: Active Interventions: Podiatry chair, stretcher in low position and side rails up as needed Notes: Venous Leg Ulcer Nursing Diagnoses: Knowledge deficit related to disease process and management Goals: Verify adequate tissue perfusion prior to therapeutic compression application Date Initiated: 04/21/2019 Target Resolution Date: 04/21/2019 Goal Status: Active Interventions: Provide education on venous insufficiency Treatment Activities: Therapeutic compression applied : 04/21/2019 Notes: Wound/Skin Impairment Nursing Diagnoses: Impaired tissue integrity Goals: Ulcer/skin breakdown will have a volume reduction of 30% by week 4 Date Initiated: 04/21/2019 Target Resolution Date: 05/22/2019 Goal Status: Active [name], Hilberto A. ([id number]) Interventions: Assess ulceration(s) every visit Treatment Activities: Skin care regimen initiated : 04/21/2019 Notes: Electronic Signature(s) Signed: 05/21/2019 4:38:11 PM By: Elliot GurneyWoody, BSN, RN, CWS, [name] RN, BSN Entered By: Elliot GurneyWoody, BSN, RN, CWS, [name] on 05/19/2019 14:17:29 [name], Woodley A. ([id number]) -------------------------------------------------------------------------------- Pain Assessment Details Patient Name: [name],  Ercole A. Date of Service: 05/19/2019 2:00 PM Medical Record Number: [id number] Patient Account Number: [id number] Date of Birth/Sex: 06/20/1931 (85 y.o. M) Treating RN: Rodell PernaScott, Dajea Primary Care Adewale Pucillo: Marcelino DusterJOHNSTON, JOHN Other Clinician: Referring Shron Ozer: Marcelino DusterJOHNSTON, JOHN Treating Halvor Behrend/Extender: Altamese CarolinaOBSON, MICHAEL G Weeks in Treatment: 4 Active Problems Location of Pain Severity and Description of Pain Patient Has Paino No Site Locations Pain Management and Medication Current Pain Management: Electronic Signature(s) Signed: 05/19/2019 3:49:41 PM By: Rodell PernaScott, Dajea Entered By: Rodell PernaScott, Dajea on 05/19/2019 14:00:20 [name], Maya A. ([id number]) -------------------------------------------------------------------------------- Patient/Caregiver Education Details Patient Name: [name], Joseth A. Date of Service: 05/19/2019 2:00 PM Medical Record Number: [id number] Patient Account Number: [id number] Date of Birth/Gender: 06/20/1931 ([age] y.o. M) Treating RN: Huel CoventryWoody, [name] Primary Care Physician: Marcelino DusterJOHNSTON, JOHN Other Clinician: Referring Physician: Marcelino DusterJOHNSTON, JOHN Treating Physician/Extender: Altamese CarolinaOBSON, MICHAEL G Weeks in Treatment: 4 Education Assessment Education Provided To: Patient Education Topics Provided Venous: Handouts: Controlling Swelling with Multilayered Compression Wraps Methods: Demonstration, Explain/Verbal Responses: State content correctly Wound/Skin Impairment: Handouts: Caring for Your Ulcer Methods: Demonstration, Explain/Verbal Responses: State content correctly Electronic Signature(s) Signed: 05/21/2019 4:38:11 PM By: Elliot GurneyWoody, BSN, RN,  CWS, [name] RN, BSN Entered By: Elliot GurneyWoody, BSN, RN, CWS, [name] on 05/19/2019 14:22:38 [name], Isrrael A. ([id number]) -------------------------------------------------------------------------------- Wound Assessment Details Patient Name: [name], Omega A. Date of Service: 05/19/2019 2:00 PM Medical Record Number: [id number] Patient Account Number:  [id number] Date of Birth/Sex: 09-21-1931 (85 y.o. M) Treating RN: Rodell PernaScott, Dajea Primary Care Meghna Hagmann: Marcelino DusterJOHNSTON, JOHN Other Clinician: Referring Xela Oregel: Marcelino DusterJOHNSTON, JOHN Treating Ronnie Doo/Extender: Altamese CarolinaOBSON, MICHAEL G Weeks in Treatment: 4 Wound Status Wound Number: 1 Primary Venous Leg Ulcer Etiology: Wound Location: Right Lower Leg - Anterior Wound Status: Open Wounding Event: Gradually Appeared Comorbid Cataracts, Coronary Artery Disease, Date Acquired: 03/22/2019 History: Hypertension, Osteoarthritis Weeks Of Treatment: 4 Clustered Wound: No Photos Photo Uploaded By: Rodell PernaScott, Dajea on 05/19/2019 14:21:46 Wound Measurements Length: (cm) 0.6 Width: (cm) 0.5 Depth: (cm) 0.1 Area: (cm) 0.236 Volume: (cm) 0.024 % Reduction in Area: 72.7% % Reduction in Volume: 72.1% Epithelialization: None Tunneling: No Undermining: No Wound Description Full Thickness Without Exposed Support Classification: Structures Wound Margin: Flat and Intact Exudate Medium Amount: Exudate Type: Serous Exudate Color: amber Foul Odor After Cleansing: No Slough/Fibrino Yes Wound Bed Granulation Amount: Medium (34-66%) Exposed Structure Granulation Quality: Red Fascia Exposed: No Necrotic Amount: Medium (34-66%) Fat Layer (Subcutaneous Tissue) Exposed: Yes Necrotic Quality: Adherent Slough Tendon Exposed: No Muscle Exposed: No Joint Exposed: No Bone Exposed: No Cairns, Dario A. ([id number]) Treatment Notes Wound #1 (Right, Anterior Lower Leg) Notes mepitel, hydrofera blue, abd and 3 layer wrap Electronic Signature(s) Signed: 05/19/2019 3:49:41 PM By: Rodell PernaScott, Dajea Entered By: Rodell PernaScott, Dajea on 05/19/2019 14:06:50 Shanker, Rhylen A. ([id number]) -------------------------------------------------------------------------------- Vitals Details Patient Name: [name], Ayden A. Date of Service: 05/19/2019 2:00 PM Medical Record Number: [id number] Patient Account Number: [id number] Date of Birth/Sex:  09-21-1931 (85 y.o. M) Treating RN: Rodell PernaScott, Dajea Primary Care Zi Newbury: Marcelino DusterJOHNSTON, JOHN Other Clinician: Referring Tonnie Stillman: Marcelino DusterJOHNSTON, JOHN Treating Sami Roes/Extender: Altamese CarolinaOBSON, MICHAEL G Weeks in Treatment: 4 Vital Signs Time Taken: 14:00 Temperature (F): 98.1 Height (in): 71 Pulse (bpm): 56 Weight (lbs): 195 Respiratory Rate (breaths/min): 16 Body Mass Index (BMI): 27.2 Blood Pressure (mmHg): 139/60 Reference Range: 80 - 120 mg / dl Electronic Signature(s) Signed: 05/19/2019 3:49:41 PM By: Rodell PernaScott, Dajea Entered By: Rodell PernaScott, Dajea on 05/19/2019 14:00:42

## 2019-05-24 ENCOUNTER — Other Ambulatory Visit: Payer: Self-pay

## 2019-05-24 ENCOUNTER — Ambulatory Visit (INDEPENDENT_AMBULATORY_CARE_PROVIDER_SITE_OTHER): Payer: Medicare Other

## 2019-05-24 DIAGNOSIS — Z7901 Long term (current) use of anticoagulants: Secondary | ICD-10-CM

## 2019-05-24 DIAGNOSIS — I350 Nonrheumatic aortic (valve) stenosis: Secondary | ICD-10-CM | POA: Diagnosis not present

## 2019-05-24 DIAGNOSIS — Z952 Presence of prosthetic heart valve: Secondary | ICD-10-CM

## 2019-05-24 LAB — POCT INR: INR: 3.8 — AB (ref 2.0–3.0)

## 2019-05-24 NOTE — Patient Instructions (Signed)
Please have a large serving of greens today and continue dosage of 1 tablet every day except 1.5 tablets on Mondays, Wednesdays & Fridays.  Recheck in 3 weeks at your appt w/ Dr. Rockey Situ.

## 2019-05-26 ENCOUNTER — Other Ambulatory Visit: Payer: Self-pay

## 2019-05-26 ENCOUNTER — Encounter: Payer: Medicare Other | Attending: Internal Medicine | Admitting: Internal Medicine

## 2019-05-26 DIAGNOSIS — Z09 Encounter for follow-up examination after completed treatment for conditions other than malignant neoplasm: Secondary | ICD-10-CM | POA: Diagnosis not present

## 2019-05-26 DIAGNOSIS — Z872 Personal history of diseases of the skin and subcutaneous tissue: Secondary | ICD-10-CM | POA: Diagnosis not present

## 2019-05-29 ENCOUNTER — Other Ambulatory Visit: Payer: Self-pay | Admitting: Cardiovascular Disease

## 2019-06-01 NOTE — Progress Notes (Signed)
[name], Nabil A. ([id number]) Visit Report for 05/26/2019 Arrival Information Details Patient Name: [name] HiltsSCHENK, Krystopher A. Date of Service: 05/26/2019 1:00 PM Medical Record Number: [id number] Patient Account Number: [id number] Date of Birth/Sex: 1931-02-06 [age] y.o. M) Treating RN: Arnette NorrisBiell, Kristina Primary Care Torah Pinnock: Marcelino DusterJOHNSTON, JOHN Other Clinician: Referring Nisreen Guise: Marcelino DusterJOHNSTON, JOHN Treating Kofi Murrell/Extender: Altamese CarolinaOBSON, MICHAEL G Weeks in Treatment: 5 Visit Information History Since Last Visit Added or deleted any medications: No Patient Arrived: Walker Any new allergies or adverse reactions: No Arrival Time: 12:59 Had a fall or experienced change in No Accompanied By: self activities of daily living that may affect Transfer Assistance: None risk of falls: Patient Identification Verified: Yes Signs or symptoms of abuse/neglect since last visito No Secondary Verification Process Yes Hospitalized since last visit: No Completed: Has Dressing in Place as Prescribed: Yes Patient Has Alerts: Yes Has Compression in Place as Prescribed: Yes Patient Alerts: Patient on Blood Pain Present Now: No Thinner Warfarin Electronic Signature(s) Signed: 05/27/2019 9:59:53 AM By: Arnette NorrisBiell, Kristina Entered By: Arnette NorrisBiell, Kristina on 05/26/2019 13:00:15 [name] HiltsSCHENK, Finis A. ([id number]) -------------------------------------------------------------------------------- Clinic Level of Care Assessment Details Patient Name: [name] HiltsSCHENK, Turki A. Date of Service: 05/26/2019 1:00 PM Medical Record Number: [id number] Patient Account Number: [id number] Date of Birth/Sex: 1931-02-06 [age] y.o. M) Treating RN: Huel CoventryWoody, [name] Primary Care Frimy Uffelman: Marcelino DusterJOHNSTON, JOHN Other Clinician: Referring Ky Moskowitz: Marcelino DusterJOHNSTON, JOHN Treating Sukhdeep Wieting/Extender: Altamese CarolinaOBSON, MICHAEL G Weeks in Treatment: 5 Clinic Level of Care Assessment Items TOOL 4 Quantity Score []  - Use when only an EandM is performed on FOLLOW-UP visit 0 ASSESSMENTS - Nursing Assessment  / Reassessment []  - Reassessment of Co-morbidities (includes updates in patient status) 0 X- 1 5 Reassessment of Adherence to Treatment Plan ASSESSMENTS - Wound and Skin Assessment / Reassessment X - Simple Wound Assessment / Reassessment - one wound 1 5 []  - 0 Complex Wound Assessment / Reassessment - multiple wounds []  - 0 Dermatologic / Skin Assessment (not related to wound area) ASSESSMENTS - Focused Assessment []  - Circumferential Edema Measurements - multi extremities 0 []  - 0 Nutritional Assessment / Counseling / Intervention []  - 0 Lower Extremity Assessment (monofilament, tuning fork, pulses) []  - 0 Peripheral Arterial Disease Assessment (using hand held doppler) ASSESSMENTS - Ostomy and/or Continence Assessment and Care []  - Incontinence Assessment and Management 0 []  - 0 Ostomy Care Assessment and Management (repouching, etc.) PROCESS - Coordination of Care X - Simple Patient / Family Education for ongoing care 1 15 []  - 0 Complex (extensive) Patient / Family Education for ongoing care []  - 0 Staff obtains ChiropractorConsents, Records, Test Results / Process Orders []  - 0 Staff telephones HHA, Nursing Homes / Clarify orders / etc []  - 0 Routine Transfer to another Facility (non-emergent condition) []  - 0 Routine Hospital Admission (non-emergent condition) []  - 0 New Admissions / Manufacturing engineernsurance Authorizations / Ordering NPWT, Apligraf, etc. []  - 0 Emergency Hospital Admission (emergent condition) X- 1 10 Simple Discharge Coordination Amsler, Shermon A. ([id number]) []  - 0 Complex (extensive) Discharge Coordination PROCESS - Special Needs []  - Pediatric / Minor Patient Management 0 []  - 0 Isolation Patient Management []  - 0 Hearing / Language / Visual special needs []  - 0 Assessment of Community assistance (transportation, D/C planning, etc.) []  - 0 Additional assistance / Altered mentation []  - 0 Support Surface(s) Assessment (bed, cushion, seat, etc.) INTERVENTIONS -  Wound Cleansing / Measurement X - Simple Wound Cleansing - one wound 1 5 []  - 0 Complex Wound Cleansing - multiple wounds X- 1 5 Wound Imaging (photographs -  any number of wounds) []  - 0 Wound Tracing (instead of photographs) X- 1 5 Simple Wound Measurement - one wound []  - 0 Complex Wound Measurement - multiple wounds INTERVENTIONS - Wound Dressings []  - Small Wound Dressing one or multiple wounds 0 []  - 0 Medium Wound Dressing one or multiple wounds []  - 0 Large Wound Dressing one or multiple wounds []  - 0 Application of Medications - topical []  - 0 Application of Medications - injection INTERVENTIONS - Miscellaneous []  - External ear exam 0 []  - 0 Specimen Collection (cultures, biopsies, blood, body fluids, etc.) []  - 0 Specimen(s) / Culture(s) sent or taken to Lab for analysis []  - 0 Patient Transfer (multiple staff / Nurse, adultHoyer Lift / Similar devices) []  - 0 Simple Staple / Suture removal (25 or less) []  - 0 Complex Staple / Suture removal (26 or more) []  - 0 Hypo / Hyperglycemic Management (close monitor of Blood Glucose) []  - 0 Ankle / Brachial Index (ABI) - do not check if billed separately X- 1 5 Vital Signs Winiecki, Tadan A. ([id number]) Has the patient been seen at the hospital within the last three years: Yes Total Score: 55 Level Of Care: New/Established - Level 2 Electronic Signature(s) Signed: 05/31/2019 5:43:16 PM By: Elliot GurneyWoody, BSN, RN, CWS, [name] RN, BSN Entered By: Elliot GurneyWoody, BSN, RN, CWS, [name] on 05/26/2019 13:10:06 [name], Keymon A. ([id number]) -------------------------------------------------------------------------------- Encounter Discharge Information Details Patient Name: [name] HiltsSCHENK, Cindy A. Date of Service: 05/26/2019 1:00 PM Medical Record Number: [id number] Patient Account Number: [id number] Date of Birth/Sex: September 11, 1931 [age] y.o. M) Treating RN: Arnette NorrisBiell, Kristina Primary Care Evo Aderman: Marcelino DusterJOHNSTON, JOHN Other Clinician: Referring Terease Marcotte: Marcelino DusterJOHNSTON,  JOHN Treating Mackenzee Becvar/Extender: Altamese CarolinaOBSON, MICHAEL G Weeks in Treatment: 5 Encounter Discharge Information Items Discharge Condition: Stable Ambulatory Status: Walker Discharge Destination: Home Transportation: Private Auto Accompanied By: self Schedule Follow-up Appointment: Yes Clinical Summary of Care: Electronic Signature(s) Signed: 05/27/2019 9:59:53 AM By: Arnette NorrisBiell, Kristina Entered By: Arnette NorrisBiell, Kristina on 05/26/2019 13:15:08 [name] HiltsSCHENK, Jameon A. ([id number]) -------------------------------------------------------------------------------- Lower Extremity Assessment Details Patient Name: [name] HiltsSCHENK, Darien A. Date of Service: 05/26/2019 1:00 PM Medical Record Number: [id number] Patient Account Number: [id number] Date of Birth/Sex: September 11, 1931 [age] y.o. M) Treating RN: Arnette NorrisBiell, Kristina Primary Care Quanasia Defino: Marcelino DusterJOHNSTON, JOHN Other Clinician: Referring Latrelle Bazar: Marcelino DusterJOHNSTON, JOHN Treating Tinslee Klare/Extender: Altamese CarolinaOBSON, MICHAEL G Weeks in Treatment: 5 Edema Assessment Assessed: [Left: No] [Right: No] [Left: Edema] [Right: :] Calf Left: Right: Point of Measurement: 36 cm From Medial Instep cm 37 cm Ankle Left: Right: Point of Measurement: 12 cm From Medial Instep cm 22.5 cm Vascular Assessment Pulses: Dorsalis Pedis Palpable: [Right:Yes] Posterior Tibial Palpable: [Right:Yes] Electronic Signature(s) Signed: 05/27/2019 9:59:53 AM By: Arnette NorrisBiell, Kristina Entered By: Arnette NorrisBiell, Kristina on 05/26/2019 13:05:49 Knaak, Ura A. ([id number]) -------------------------------------------------------------------------------- Multi Wound Chart Details Patient Name: [name] HiltsSCHENK, Timoth A. Date of Service: 05/26/2019 1:00 PM Medical Record Number: [id number] Patient Account Number: [id number] Date of Birth/Sex: September 11, 1931 [age] y.o. M) Treating RN: Huel CoventryWoody, [name] Primary Care Ell Tiso: Marcelino DusterJOHNSTON, JOHN Other Clinician: Referring Anysa Tacey: Marcelino DusterJOHNSTON, JOHN Treating Chyrel Taha/Extender: Altamese CarolinaOBSON, MICHAEL G Weeks in Treatment: 5 Vital  Signs Height(in): 71 Pulse(bpm): 53 Weight(lbs): 195 Blood Pressure(mmHg): 134/66 Body Mass Index(BMI): 27 Temperature(F): 97.6 Respiratory Rate 18 (breaths/min): Photos: [N/A:N/A] Wound Location: Right, Anterior Lower Leg N/A N/A Wounding Event: Gradually Appeared N/A N/A Primary Etiology: Venous Leg Ulcer N/A N/A Comorbid History: Cataracts, Coronary Artery N/A N/A Disease, Hypertension, Osteoarthritis Date Acquired: 03/22/2019 N/A N/A Weeks of Treatment: 5 N/A N/A Wound Status: Healed - Epithelialized N/A N/A Measurements L x W x D 0x0x0 N/A  N/A (cm) Area (cm) : 0 N/A N/A Volume (cm) : 0 N/A N/A % Reduction in Area: 100.00% N/A N/A % Reduction in Volume: 100.00% N/A N/A Classification: Full Thickness Without N/A N/A Exposed Support Structures Exudate Amount: Small N/A N/A Exudate Type: Serous N/A N/A Exudate Color: amber N/A N/A Wound Margin: Flat and Intact N/A N/A Granulation Amount: Medium (34-66%) N/A N/A Granulation Quality: Red N/A N/A Necrotic Amount: Small (1-33%) N/A N/A Exposed Structures: Fascia: No N/A N/A Fat Layer (Subcutaneous Tissue) Exposed: No Tendon: No Muscle: No Matteson, Jebediah A. ([id number]) Joint: No Bone: No Epithelialization: Medium (34-66%) N/A N/A Treatment Notes Electronic Signature(s) Signed: 05/26/2019 6:06:19 PM By: Linton Ham MD Entered By: Linton Ham on 05/26/2019 13:31:11 Sharren Bridge A. ([id number]) -------------------------------------------------------------------------------- Multi-Disciplinary Care Plan Details Patient Name: Sharren Bridge A. Date of Service: 05/26/2019 1:00 PM Medical Record Number: [id number] Patient Account Number: [id number] Date of Birth/Sex: [birth year] (85 y.o. M) Treating RN: Cornell Barman Primary Care Jashanti Clinkscale: Harrel Lemon Other Clinician: Referring Emmaclaire Switala: Harrel Lemon Treating Brett Darko/Extender: Tito Dine in Treatment: 5 Active Inactive Electronic  Signature(s) Signed: 05/31/2019 5:43:16 PM By: Gretta Cool, BSN, RN, CWS, [name] RN, BSN Entered By: Gretta Cool, BSN, RN, CWS, [name] on 05/26/2019 13:08:04 Hadley Pen ([id number]) -------------------------------------------------------------------------------- Pain Assessment Details Patient Name: Sharren Bridge A. Date of Service: 05/26/2019 1:00 PM Medical Record Number: [id number] Patient Account Number: [id number] Date of Birth/Sex: 1931/04/12 (85 y.o. M) Treating RN: Harold Barban Primary Care Robbi Spells: Harrel Lemon Other Clinician: Referring Avan Gullett: Harrel Lemon Treating Infantof Villagomez/Extender: Tito Dine in Treatment: 5 Active Problems Location of Pain Severity and Description of Pain Patient Has Paino No Site Locations Pain Management and Medication Current Pain Management: Electronic Signature(s) Signed: 05/27/2019 9:59:53 AM By: Harold Barban Entered By: Harold Barban on 05/26/2019 13:00:55 Hadley Pen ([id number]) -------------------------------------------------------------------------------- Patient/Caregiver Education Details Patient Name: Sharren Bridge A. Date of Service: 05/26/2019 1:00 PM Medical Record Number: [id number] Patient Account Number: [id number] Date of Birth/Gender: 1931-02-21 (85 y.o. M) Treating RN: Cornell Barman Primary Care Physician: Harrel Lemon Other Clinician: Referring Physician: Harrel Lemon Treating Physician/Extender: Tito Dine in Treatment: 5 Education Assessment Education Provided To: Patient Education Topics Provided Wound/Skin Impairment: Handouts: Caring for Your Ulcer Methods: Demonstration, Explain/Verbal Responses: State content correctly Electronic Signature(s) Signed: 05/31/2019 5:43:16 PM By: Gretta Cool, BSN, RN, CWS, [name] RN, BSN Entered By: Gretta Cool, BSN, RN, CWS, [name] on 05/26/2019 13:10:21 Hadley Pen ([id number]) -------------------------------------------------------------------------------- Wound  Assessment Details Patient Name: Sharren Bridge A. Date of Service: 05/26/2019 1:00 PM Medical Record Number: [id number] Patient Account Number: [id number] Date of Birth/Sex: 07/06/1931 (85 y.o. M) Treating RN: Cornell Barman Primary Care Tamsen Reist: Harrel Lemon Other Clinician: Referring Wanona Stare: Harrel Lemon Treating Diesel Lina/Extender: Tito Dine in Treatment: 5 Wound Status Wound Number: 1 Primary Venous Leg Ulcer Etiology: Wound Location: Right, Anterior Lower Leg Wound Status: Healed - Epithelialized Wounding Event: Gradually Appeared Comorbid Cataracts, Coronary Artery Disease, Date Acquired: 03/22/2019 History: Hypertension, Osteoarthritis Weeks Of Treatment: 5 Clustered Wound: No Photos Wound Measurements Length: (cm) 0 % Reduc Width: (cm) 0 % Reduc Depth: (cm) 0 Epithel Area: (cm) 0 Tunnel Volume: (cm) 0 Underm tion in Area: 100% tion in Volume: 100% ialization: Medium (34-66%) ing: No ining: No Wound Description Full Thickness Without Exposed Support Classification: Structures Wound Margin: Flat and Intact Exudate Small Amount: Exudate Type: Serous Exudate Color: amber Foul Odor After Cleansing: No Slough/Fibrino Yes Wound Bed Granulation Amount: Medium (34-66%) Exposed Structure Granulation Quality: Red Fascia Exposed: No Necrotic Amount:  Small (1-33%) Fat Layer (Subcutaneous Tissue) Exposed: No Necrotic Quality: Adherent Slough Tendon Exposed: No Muscle Exposed: No Joint Exposed: No Bone Exposed: No Liebig, Eyob A. ([id number]) Electronic Signature(s) Signed: 05/31/2019 5:43:16 PM By: Elliot GurneyWoody, BSN, RN, CWS, [name] RN, BSN Entered By: Elliot GurneyWoody, BSN, RN, CWS, [name] on 05/26/2019 13:07:45 [name], Bishop A. ([id number]) -------------------------------------------------------------------------------- Vitals Details Patient Name: [name] HiltsSCHENK, Matyas A. Date of Service: 05/26/2019 1:00 PM Medical Record Number: [id number] Patient Account Number:  [id number] Date of Birth/Sex: [birth year] [age] y.o. M) Treating RN: Arnette NorrisBiell, Kristina Primary Care Jerek Meulemans: Marcelino DusterJOHNSTON, JOHN Other Clinician: Referring Lyall Faciane: Marcelino DusterJOHNSTON, JOHN Treating [place] Steidle/Extender: Altamese CarolinaOBSON, MICHAEL G Weeks in Treatment: 5 Vital Signs Time Taken: 13:00 Temperature (F): 97.6 Height (in): 71 Pulse (bpm): 53 Weight (lbs): 195 Respiratory Rate (breaths/min): 18 Body Mass Index (BMI): 27.2 Blood Pressure (mmHg): 134/66 Reference Range: 80 - 120 mg / dl Electronic Signature(s) Signed: 05/27/2019 9:59:53 AM By: Arnette NorrisBiell, Kristina Entered By: Arnette NorrisBiell, Kristina on 05/26/2019 13:01:49

## 2019-06-01 NOTE — Progress Notes (Signed)
[name], Jadarian A. ([id number]) Visit Report for 05/26/2019 HPI Details Patient Name: [name], Willman A. Date of Service: 05/26/2019 1:00 PM Medical Record Number: [id number] Patient Account Number: [id number] Date of Birth/Sex: 07/07/1931 [age] y.o. M) Treating RN: Huel CoventryWoody, [name] Primary Care Provider: Marcelino DusterJOHNSTON, JOHN Other Clinician: Referring Provider: Marcelino DusterJOHNSTON, JOHN Treating Provider/Extender: Altamese CarolinaOBSON, MICHAEL G Weeks in Treatment: 5 History of Present Illness HPI Description: 04/21/2019 ADMISSION This is an independent 85 year old man who [place] in the independent part [place] [address]. He has a history of chronic lower extremity edema and wears compression stockings. He states about a month ago he was taking off the one on the right and pulled some skin off accidentally. He has had 2 small wounds on the right anterior and right medial lower extremity. I [facility] that he had multiple venous ultrasounds in 2015 and 16. These were DVT rule outs. He did have a Baker's cyst on the left. He has not not had a previous wound history although he did have a history of cellulitis in his legs. Past medical history; includes aortic stenosis status post mechanical AVR in 2007 on chronic Coumadin, lower extremity edema with a history of cellulitis, history of MRSA. ABIs in our clinic were 1.1 on the right 6/3; patient with predominantly venous insufficiency ulcers in the right lower leg probably some degree of lymphedema. He had to wounds last week. We put him in 3 layer compression. [facility] [place] [facility]. The area laterally is healed but he still has a small very painful area on the anterior tibial area. He is on Coumadin because of mechanical aortic valve. 6/10; venous insufficiency ulcers on the right lower leg. He has some degree of lymphedema. We have been using 3 layer compression with silver collagen small wound. Dimensions are  improved. I gave him doxycycline last week which he tolerated because of surrounding erythema. This is improved also 6/17; arrives in clinic today with portable silver collagen dressing tightly adherent to the wound. Also felt that the wrap was too tight 3 layer [facility] [place] [facility]; patient's wound is small still adherent debris over the surface however even under illumination it was hard to see what was still open here. We have been using silver collagen 7/1; the patient arrives in the clinic today and the area on the right lower leg is healed. He has severe chronic venous insufficiency. He has 20/30 compression stockings. Electronic Signature(s) Signed: 05/26/2019 6:06:19 PM By: Baltazar Najjarobson, Michael MD Entered By: Baltazar Najjarobson, Michael on 05/26/2019 13:31:52 [name], Notnamed AMarland [name]. ([id number]) -------------------------------------------------------------------------------- Physical Exam Details Patient Name: [name], [name] Date of Service: 05/26/2019 1:00 PM Medical Record Number: [id number] Patient Account Number: [id number] Date of Birth/Sex: 07/07/1931 [age] y.o. M) Treating RN: Huel CoventryWoody, [name] Primary Care Provider: Marcelino DusterJOHNSTON, JOHN Other Clinician: Referring Provider: Marcelino DusterJOHNSTON, JOHN Treating Provider/Extender: Altamese CarolinaOBSON, MICHAEL G Weeks in Treatment: 5 Constitutional Sitting or standing Blood Pressure is within target range for patient.. Pulse regular and within target range for patient.[name]. Respirations regular, non-labored and within target range.. Temperature is normal and within the target range for the patient.[name]. appears in no distress. Cardiovascular Pedal pulses are palpable. Notes Wound exam; distal right lower leg in the setting of severe chronic venous insufficiency and venous stasis. The wound has closed over. We put him in compression all week with Hydrofera Blue. Electronic Signature(s) Signed: 05/26/2019 6:06:19 PM By: Baltazar Najjarobson, Michael MD Entered By: Baltazar Najjarobson, Michael on  05/26/2019 13:32:52  [name], Demetres AMarland [name]. ([id number]) -------------------------------------------------------------------------------- Physician Orders Details Patient Name: [name], [name] Date of Service: 05/26/2019 1:00 PM Medical Record Number: [id number] Patient Account Number: [id number] Date of Birth/Sex: 1931-04-14 [age] y.o. M) Treating RN: Huel CoventryWoody, [name] Primary Care Provider: Marcelino DusterJOHNSTON, JOHN Other Clinician: Referring Provider: Marcelino DusterJOHNSTON, JOHN Treating Provider/Extender: Altamese CarolinaOBSON, MICHAEL G Weeks in Treatment: 5 Verbal / Phone Orders: No Diagnosis Coding Skin Barriers/Peri-Wound Care o Moisturizing lotion - daily-Cetaphil Follow-up Appointments o Other: - If needed. Edema Control o Patient to wear own compression stockings [facility] o Discharge from Wound Care Center - Treatment complete Electronic Signature(s) Signed: 05/26/2019 6:06:19 PM By: Baltazar Najjarobson, Michael MD Signed: 05/31/2019 5:43:16 PM By: Elliot GurneyWoody, BSN, RN, CWS, [name] RN, BSN Entered By: Elliot GurneyWoody, BSN, RN, CWS, [name] on 05/26/2019 13:09:33 [name], Ioane A. ([id number]) -------------------------------------------------------------------------------- Problem List Details Patient Name: [name], Matias A. Date of Service: 05/26/2019 1:00 PM Medical Record Number: [id number] Patient Account Number: [id number] Date of Birth/Sex: 1931-04-14 [age] y.o. M) Treating RN: Huel CoventryWoody, [name] Primary Care Provider: Marcelino DusterJOHNSTON, JOHN Other Clinician: Referring Provider: Marcelino DusterJOHNSTON, JOHN Treating Provider/Extender: Altamese CarolinaOBSON, MICHAEL G Weeks in Treatment: 5 Active Problems ICD-10 Evaluated Encounter Code Description Active Date Today Diagnosis I87.331 Chronic venous hypertension (idiopathic) with ulcer and 04/21/2019 No Yes inflammation of right lower extremity L97.811 Non-pressure chronic ulcer of other part of right lower leg 04/21/2019 No Yes limited to breakdown of skin L03.115 Cellulitis of right lower limb 04/28/2019 No Yes Inactive  Problems Resolved Problems Electronic Signature(s) Signed: 05/26/2019 6:06:19 PM By: Baltazar Najjarobson, Michael MD Entered By: Baltazar Najjarobson, Michael on 05/26/2019 13:31:01 [name], Jayshaun A. ([id number]) -------------------------------------------------------------------------------- Progress Note Details Patient Name: [name], Macallan A. Date of Service: 05/26/2019 1:00 PM Medical Record Number: [id number] Patient Account Number: [id number] Date of Birth/Sex: 1931-04-14 76(85 y.o. M) Treating RN: Huel CoventryWoody, [name] Primary Care Provider: Marcelino DusterJOHNSTON, JOHN Other Clinician: Referring Provider: Marcelino DusterJOHNSTON, JOHN Treating Provider/Extender: Altamese CarolinaOBSON, MICHAEL G Weeks in Treatment: 5 Subjective History of Present Illness (HPI) 04/21/2019 ADMISSION This is an independent 85 year old man who lives in the independent part [place] [address]. He has a history of chronic lower extremity edema and wears compression stockings. He states about a month ago he was taking off the one on the right and pulled some skin off accidentally. He has had 2 small wounds on the right anterior and right medial lower extremity. I [facility] that he had multiple venous ultrasounds in 2015 and 16. These were DVT rule outs. He did have a Baker's cyst on the left. He has not not had a previous wound history although he did have a history of cellulitis in his legs. Past medical history; includes aortic stenosis status post mechanical AVR in 2007 on chronic Coumadin, lower extremity edema with a history of cellulitis, history of MRSA. ABIs in our clinic were 1.1 on the right 6/3; patient with predominantly venous insufficiency ulcers in the right lower leg probably some degree of lymphedema. He had to wounds last week. We put him in 3 layer compression. [facility] [place] [facility] the dressing. The area laterally is healed but he still has a small very painful area on the anterior tibial area. He is on Coumadin  because of mechanical aortic valve. 6/10; venous insufficiency ulcers on the right lower leg. He has some degree of lymphedema. We have been using 3 layer compression with silver collagen small wound. Dimensions are improved. I gave him doxycycline last week which he tolerated because of surrounding erythema. This is  improved also 6/17; arrives in clinic today with portable silver collagen dressing tightly adherent to the wound. Also felt that the wrap was too tight 3 layer being changed at the [place] 6/24; patient's wound is small still adherent debris over the surface however even under illumination it was hard to see what was still open here. We have been using silver collagen 7/1; the patient arrives in the clinic today and the area on the right lower leg is healed. He has severe chronic venous insufficiency. He has 20/30 compression stockings. Objective Constitutional Sitting or standing Blood Pressure is within target range for patient.. Pulse regular and within target range for patient.[name] Respirations regular, non-labored and within target range.. Temperature is normal and within the target range for the patient.[name] appears in no distress. Vitals Time Taken: 1:00 PM, Height: 71 in, Weight: 195 lbs, BMI: 27.2, Temperature: 97.6 F, Pulse: 53 bpm, Respiratory Rate: 18 breaths/min, Blood Pressure: 134/66 mmHg. [place], [employer] ([id number]) Cardiovascular Pedal pulses are palpable. General Notes: Wound exam; distal right lower leg in the setting of severe chronic venous insufficiency and venous stasis. The wound has closed over. We put him in compression all week with Hydrofera Blue. Integumentary (Hair, Skin) Wound #1 status is Healed - Epithelialized. Original cause of wound was Gradually Appeared. The wound is located on the Right,Anterior Lower Leg. The wound measures 0cm length x 0cm width x 0cm depth; 0cm^2 area and 0cm^3 volume. There is no tunneling or undermining  noted. There is a small amount of serous drainage noted. The wound margin is flat and intact. There is medium (34-66%) red granulation within the wound bed. There is a small (1-33%) amount of necrotic tissue within the wound bed including Adherent Slough. Assessment Active Problems ICD-10 Chronic venous hypertension (idiopathic) with ulcer and inflammation of right lower extremity Non-pressure chronic ulcer of other part of right lower leg limited to breakdown of skin Cellulitis of right lower limb Plan Skin Barriers/Peri-Wound Care: Moisturizing lotion - daily-Cetaphil Follow-up Appointments: Other: - If needed. Edema Control: Patient to wear own compression stockings Discharge From [facility]: Discharge from [place] complete 1. The patient can be discharged from the wound care center 2. He is to use his own 20/30 compression stockings. Cautioned to lubricate his skin every night with Cetaphil cream Electronic Signature(s) Signed: 05/26/2019 6:06:19 PM By: Linton Ham MD Entered By: Linton Ham on 05/26/2019 13:33:31 [place] ([id number]) -------------------------------------------------------------------------------- SuperBill Details Patient Name: Sharren Bridge A. Date of Service: 05/26/2019 Medical Record Number: [id number] Patient Account Number: [id number] Date of Birth/Sex: 04/03/1931 (85 y.o. M) Treating RN: Cornell Barman Primary Care Provider: Harrel Lemon Other Clinician: Referring Provider: Harrel Lemon Treating Provider/Extender: Tito Dine in Treatment: 5 Diagnosis Coding ICD-10 Codes Code Description [phone] Chronic venous hypertension (idiopathic) with ulcer and inflammation of right lower extremity L97.811 Non-pressure chronic ulcer of other part of right lower leg limited to breakdown of skin L03.115 Cellulitis of right lower limb Facility Procedures CPT4 Code: [id number] Description: [id number] - WOUND CARE  VISIT-LEV 2 EST PT Modifier: Quantity: 1 Physician Procedures CPT4: Description Modifier Quantity Code [id number] - WC PHYS LEVEL 2 - EST PT 1 ICD-10 Diagnosis Description I87.331 Chronic venous hypertension (idiopathic) with ulcer and inflammation of right lower extremity L97.811 Non-pressure chronic ulcer of  other part of right lower leg limited to breakdown of skin Electronic Signature(s) Signed: 05/26/2019 6:06:19 PM By: Linton Ham MD Entered By: Linton Ham on 05/26/2019 13:33:50

## 2019-06-11 ENCOUNTER — Telehealth: Payer: Self-pay | Admitting: Cardiovascular Disease

## 2019-06-11 NOTE — Progress Notes (Signed)
Cardiology Office Note  Date:  06/14/2019   ID:  [name], DOB July 09, 1931, MRN [id number]  PCP:  Baxter Hire, MD   Chief Complaint  Patient presents with  . Other    3 month follow up. Patient c/o Swelling in legs. Patient denies chest pain and SOB. meds reviewed verbally with patient.     HPI:  [name] is a 85 y.o. male with  h/o aortic stenosis  s/p St. Jude mechanical aortic valve replacement in 2002 on chronic Coumadin therapy,  mild CAD by cardiac cath in 2011 managed medically,  GERD,  HTN,  carotid bruits,  Chronic hyponatremia asymptomatic bradycardia,  RLS, anxiety, depression,  GI bleed OSA on CPAP osteoarthritis s/p total left hip replacement on 03/21/2015 who [facility] with a hgb of 8 (baseline 12), he was started on po iron replacement therapy, who presented to on 04/02/15 with acute onset of weakness, melena, BRBPR, and was found to have a hgb of 5.1. Aspirin and warfarin initially held, restarted at a later date, S/p EGD Ejection fraction greater than 55% March 2018, moderately elevated right heart pressures He presents today for follow-up of his aortic valve replacement and hypertension  Last visit was a telemetry visit March 11, 2019 Leg swelling had improved Lasix has been changed to torsemide 20 twice daily weight sometimes down to weight 195 pounds, sometimes 188 High weight 212  weigth at home 194 today Weight here 207, large discrepancy  Goal weight is 190 to 194  Blood pressure 148/70 Lab work reviewed with him in detail Total Chol 164/ LDL 98 CR 1.1  EKG personally reviewed by myself on todays visit Shows atrial fibrillation rhythm with slow ventricular response. 57 bpm. Inferior infarct, age undetermined. Abnormal ECG.    Prior echocardiogram April 2018 Previous Echocardiogram shows normal LV function, well-functioning aortic valve, Moderately elevated right heart pressures with severe  TR  Was in the ED 08/16/17 due to HTN. Patient was treated and released.  Blood pressure was 215/100  Recently seen in the emergency room Twice for hypertension On a prior office visit I recommended he increase isosorbide to 30 mg twice a day Also was recommended to double his Diovan dose He was given 1 dose clonidine in the emergency room On a follow-up visit with ryan Dunn it was recommended he increase isosorbide to 30 mg 3 times a day, increase metoprolol up to 50 mg daily He showed up and began in the emergency room several days later  Not on hydralazine secondary to side effects Clonidine was fatigue dry mouth Prior notes indicating amlodipine 10 mg caused leg swelling,  Though he reports stopping the amlodipine did not change his swelling  48 hour Holter monitor 12/2015 Normal sinus rhythm Average heart beat 70 bpm, maximum heart rate 114 bpm, minimum 45 bpm (at 3 AM) Frequent APCs, couplets, bigeminy's with short runs, longest run of 5 beats (12% of all beats)  Previous cardiac catheterization report from 2011 reviewed with him showing 20% proximal LAD, 40% mid LAD disease  PMH:   has a past medical history of Anxiety (10/11), Aortic stenosis, Bradycardia, C. difficile colitis, Carotid bruit, Chronic diastolic CHF (congestive heart failure) ([place]), Coronary artery disease, Decreased hearing, Depression, Gastric ulcer, GERD (gastroesophageal reflux disease), Hypertension, Mod-Sev Tricuspid regurgitation, RLS (restless legs syndrome) (08/23/2015), S/P AVR, and SOB (shortness of breath) (10/11).  PSH:    Past Surgical History:  Procedure Laterality Date  . CARDIAC CATHETERIZATION    .  ESOPHAGOGASTRODUODENOSCOPY N/A 04/05/2015   Procedure: ESOPHAGOGASTRODUODENOSCOPY (EGD);  Surgeon: [name], MD;  [facility];  Service: Endoscopy;  Laterality: N/A;  . ESOPHAGOGASTRODUODENOSCOPY N/A 04/17/2015   Procedure: ESOPHAGOGASTRODUODENOSCOPY (EGD);  Surgeon: Scot Junobert T  Elliott, MD;  [facility];  Service: Endoscopy;  Laterality: N/A;  . ESOPHAGOGASTRODUODENOSCOPY N/A 08/02/2015   Procedure: ESOPHAGOGASTRODUODENOSCOPY (EGD);  Surgeon: [name], MD;  [facility];  Service: Endoscopy;  Laterality: N/A;  . HERNIA REPAIR    . JOINT REPLACEMENT    . TOTAL HIP ARTHROPLASTY    . VALVE REPLACEMENT  1/02   Aortic; echo 3/09 valve working well; echo 10/11 working well; put on Coumadin    Current Outpatient Medications on File Prior to Visit  Medication Sig Dispense Refill  . Cetirizine HCl 10 MG CAPS Take 10 mg by mouth daily.    . citalopram (CELEXA) 20 MG tablet Take 20 mg by mouth daily.    . cloNIDine (CATAPRES) 0.1 MG tablet TAKE 1 TABLET BY MOUTH TWO  TIMES DAILY 180 tablet 2  . doxazosin (CARDURA) 8 MG tablet TAKE 1 TABLET BY MOUTH  TWICE A DAY 180 tablet 3  . enoxaparin (LOVENOX) 100 MG/ML injection Inject 1 mL (100 mg total) into the skin every 12 (twelve) hours. 20 Syringe 1  . finasteride (PROSCAR) 5 MG tablet Take 5 mg by mouth daily.      . isosorbide mononitrate (IMDUR) 60 MG 24 hr tablet Take 1 tablet (60 mg total) by mouth 2 (two) times daily. 28 tablet 0  . metoprolol succinate (TOPROL-XL) 25 MG 24 hr tablet Take 0.5 tablets (12.5 mg total) by mouth daily. 90 tablet 3  . potassium chloride (K-DUR) 10 MEQ tablet Take 2 tablets (20 mEq total) by mouth daily. 180 tablet 3  . ranitidine (ZANTAC) 150 MG tablet Take 150 mg by mouth as needed.    Marland Kitchen. rOPINIRole (REQUIP) 2 MG tablet TAKE 1 TABLET BY MOUTH 4  TIMES DAILY 360 tablet 3  . Tamsulosin HCl (FLOMAX) 0.4 MG CAPS Take 0.4 mg by mouth at bedtime.     . torsemide (DEMADEX) 20 MG tablet Take 1 tablet (20 mg total) by mouth 2 (two) times daily. Hold for weight <190 pounds 180 tablet 3  . traZODone (DESYREL) 50 MG tablet Take 50 mg by mouth at bedtime.    Marland Kitchen. warfarin (COUMADIN) 5 MG tablet Take 1 tablet daily except 1 and 1/2 tabs on Monday, Wednesday, and Friday or as directed by  Coumadin Clinic. 120 tablet 1   No current facility-administered medications on file prior to visit.     Allergies:   Levofloxacin and Sulfa antibiotics   Social History:  The patient  reports that he quit smoking about 41 years ago. His smoking use included cigarettes. He has never used smokeless tobacco. He reports current alcohol use of about 3.0 standard drinks of alcohol per week. He reports that he does not use drugs.   Family History:   family history includes Diabetes type II in his mother; Heart attack in his mother; Heart disease in his father and mother; Hypertension in his father, mother, and another family member; Stroke in his brother, brother, and father.    Review of Systems: Review of Systems  Constitutional: Negative.   Eyes: Negative.   Respiratory: Negative.   Cardiovascular: Positive for leg swelling.  Gastrointestinal: Negative.   Genitourinary: Negative.   Musculoskeletal: Negative.   Neurological: Negative.   Psychiatric/Behavioral: Negative.   All other  systems reviewed and are negative.    PHYSICAL EXAM: VS:  BP (!) 142/70 (BP Location: Left Arm, Patient Position: Sitting, Cuff Size: Normal)   Pulse (!) 54   Ht 5\' 11"  (1.803 m)   Wt 207 lb (93.9 kg)   BMI 28.87 kg/m  , BMI Body mass index is 28.87 kg/m.  Presents with a walker today. Constitutional:  oriented to person, place, and time. No distress.  HENT: normal Head: Grossly normal Eyes:  no discharge. No scleral icterus.  Neck: No JVD, no carotid bruits  Cardiovascular: Regular rate and rhythm, no murmurs appreciated  Trace to 1+ edema bilaterally lower extremities.  Wearing compression hose Pulmonary/Chest: Clear to auscultation bilaterally, no wheezes or rales Abdominal: Soft.  no distension.  no tenderness.  Musculoskeletal: Normal range of motion Neurological:  normal muscle tone. Coordination normal. No atrophy Skin: Skin warm and dry Psychiatric: normal affect, pleasant   Recent  Labs: 10/27/2018: B Natriuretic Peptide 467.0 11/24/2018: BUN 16; Creatinine, Ser 0.97; Hemoglobin 12.3; Platelets 145; Potassium 4.0; Sodium 131    Lipid Panel No results found for: CHOL, HDL, LDLCALC, TRIG    Wt Readings from Last 3 Encounters:  12/09/16 200 lb (90.7 kg)  11/08/16 183 lb (83 kg) (wrong weight?)  10/15/16 202 lb 4 oz (91.7 kg)   Weight  08/13/2017 is 217 pounds Weight September 26, 2017 is 211 pounds   ASSESSMENT AND PLAN:  Essential hypertension - Blood pressure is well controlled on today's visit. No changes made to the medications. Stable  Coronary artery disease involving native coronary artery of native heart without angina pectoris Stable, denies any anginal symptoms  Atrial flutter, typical Plan: On anticoagulation, rate well controlled Previously declined cardioversion  S/P AVR (aortic valve replacement) Well-functioning aortic valve Well-functioning valve December 2019  SOB (shortness of breath) Stable symptoms, no regular exercise Continue torsemide 20 twice daily up to 30 twice daily for weight gain  Lower extremity edema Wears compression hose, torsemide as above He will have follow-up lab work with primary care   Total encounter time more than 25 minutes  Greater than 50% was spent in counseling and coordination of care with the patient  Disposition:   F/U  6 month    Signed, Dossie Arbourim Ophia Shamoon, M.D., Ph.D. 06/14/2019  [facility] [place] [phone]

## 2019-06-11 NOTE — Telephone Encounter (Signed)

## 2019-06-14 ENCOUNTER — Other Ambulatory Visit: Payer: Self-pay

## 2019-06-14 ENCOUNTER — Telehealth: Payer: Self-pay | Admitting: Cardiovascular Disease

## 2019-06-14 ENCOUNTER — Ambulatory Visit (INDEPENDENT_AMBULATORY_CARE_PROVIDER_SITE_OTHER): Payer: Medicare Other | Admitting: Cardiovascular Disease

## 2019-06-14 ENCOUNTER — Ambulatory Visit (INDEPENDENT_AMBULATORY_CARE_PROVIDER_SITE_OTHER): Payer: Medicare Other

## 2019-06-14 VITALS — BP 142/70 | HR 54 | Ht 71.0 in | Wt 207.0 lb

## 2019-06-14 DIAGNOSIS — I350 Nonrheumatic aortic (valve) stenosis: Secondary | ICD-10-CM | POA: Diagnosis not present

## 2019-06-14 DIAGNOSIS — I5032 Chronic diastolic (congestive) heart failure: Secondary | ICD-10-CM

## 2019-06-14 DIAGNOSIS — I25118 Atherosclerotic heart disease of native coronary artery with other forms of angina pectoris: Secondary | ICD-10-CM

## 2019-06-14 DIAGNOSIS — Z7901 Long term (current) use of anticoagulants: Secondary | ICD-10-CM | POA: Diagnosis not present

## 2019-06-14 DIAGNOSIS — G4733 Obstructive sleep apnea (adult) (pediatric): Secondary | ICD-10-CM

## 2019-06-14 DIAGNOSIS — Z952 Presence of prosthetic heart valve: Secondary | ICD-10-CM

## 2019-06-14 DIAGNOSIS — I4892 Unspecified atrial flutter: Secondary | ICD-10-CM

## 2019-06-14 LAB — POCT INR: INR: 4 — AB (ref 2.0–3.0)

## 2019-06-14 MED ORDER — TORSEMIDE 20 MG PO TABS: ORAL_TABLET | ORAL | 3 refills | Status: DC

## 2019-06-14 MED ORDER — POTASSIUM CHLORIDE ER 10 MEQ PO TBCR: 30.0000 meq | EXTENDED_RELEASE_TABLET | Freq: Every day | ORAL | 3 refills | Status: DC

## 2019-06-14 NOTE — Telephone Encounter (Signed)
Patient calling back. He verbalized understanding to increase potassium to 2 tablets (30 mEq) by mouth daily. Med list updated.

## 2019-06-14 NOTE — Telephone Encounter (Signed)
Patient has questions regarding potassium and if he should increase it or leave it be due to increasing the fluid pill. Please advise

## 2019-06-14 NOTE — Patient Instructions (Addendum)
Medication Instructions:  Increase the torsemide up to 30 mg twice a day  If you need a refill on your cardiac medications before your next appointment, please call your pharmacy.    Lab work: No new labs needed   If you have labs (blood work) drawn today and your tests are completely normal, you will receive your results only by: Marland Kitchen MyChart Message (if you have MyChart) OR . A paper copy in the mail If you have any lab test that is abnormal or we need to change your treatment, we will call you to review the results.   Testing/Procedures: No new testing needed   Follow-Up: At [facility], you and your health needs are our priority.  As part of our continuing mission to provide you with exceptional heart care, we have created designated Provider Care Teams.  These Care Teams include your primary Cardiologist (physician) and Advanced Practice Providers (APPs -  Physician Assistants and Nurse Practitioners) who all work together to provide you with the care you need, when you need it.  . You will need a follow up appointment in 6 months (January 2021) .   Please call our office 2 months in advance to schedule this appointment.  (call in early November to schedule)  . Providers on your designated Care Team:   . Murray Hodgkins, NP . Christell Faith, PA-C . Marrianne Mood, PA-C  Any Other Special Instructions Will Be Listed Below (If Applicable).  For educational health videos Log in to : [email or web address] Or : [email or web address], password : [email or web address]

## 2019-06-14 NOTE — Telephone Encounter (Signed)
Per Dr Rockey Situ from secure chat message: sure, up to 3 a day with higher dose torsemide     No answer on patient's numbers listed.  Left message to call back.

## 2019-06-14 NOTE — Patient Instructions (Signed)
Please skip coumadin tonight, then resume dosage of 1 tablet every day except 1.5 tablets on Mondays, Wednesdays & Fridays.  Recheck in 3 weeks.

## 2019-07-05 ENCOUNTER — Ambulatory Visit (INDEPENDENT_AMBULATORY_CARE_PROVIDER_SITE_OTHER): Payer: Medicare Other

## 2019-07-05 ENCOUNTER — Other Ambulatory Visit: Payer: Self-pay

## 2019-07-05 DIAGNOSIS — I350 Nonrheumatic aortic (valve) stenosis: Secondary | ICD-10-CM

## 2019-07-05 DIAGNOSIS — Z7901 Long term (current) use of anticoagulants: Secondary | ICD-10-CM

## 2019-07-05 DIAGNOSIS — Z952 Presence of prosthetic heart valve: Secondary | ICD-10-CM | POA: Diagnosis not present

## 2019-07-05 LAB — POCT INR: INR: 3.2 — AB (ref 2.0–3.0)

## 2019-07-05 NOTE — Patient Instructions (Signed)
Please continue dosage of 1 tablet every day except 1.5 tablets on Mondays, Wednesdays & Fridays.   Recheck in 5 weeks.   

## 2019-07-29 ENCOUNTER — Encounter: Payer: Self-pay | Admitting: Student in an Organized Health Care Education/Training Program

## 2019-08-03 ENCOUNTER — Ambulatory Visit: Payer: Medicare Other | Admitting: Student in an Organized Health Care Education/Training Program

## 2019-08-04 ENCOUNTER — Encounter: Payer: Self-pay | Admitting: Student in an Organized Health Care Education/Training Program

## 2019-08-05 ENCOUNTER — Ambulatory Visit
Payer: Medicare Other | Attending: Student in an Organized Health Care Education/Training Program | Admitting: Student in an Organized Health Care Education/Training Program

## 2019-08-05 ENCOUNTER — Encounter: Payer: Self-pay | Admitting: Student in an Organized Health Care Education/Training Program

## 2019-08-05 ENCOUNTER — Other Ambulatory Visit: Payer: Self-pay

## 2019-08-05 DIAGNOSIS — Z952 Presence of prosthetic heart valve: Secondary | ICD-10-CM

## 2019-08-05 DIAGNOSIS — G894 Chronic pain syndrome: Secondary | ICD-10-CM | POA: Diagnosis not present

## 2019-08-05 DIAGNOSIS — Z7901 Long term (current) use of anticoagulants: Secondary | ICD-10-CM

## 2019-08-05 DIAGNOSIS — M25562 Pain in left knee: Secondary | ICD-10-CM

## 2019-08-05 DIAGNOSIS — M1712 Unilateral primary osteoarthritis, left knee: Secondary | ICD-10-CM

## 2019-08-05 DIAGNOSIS — G8929 Other chronic pain: Secondary | ICD-10-CM

## 2019-08-05 NOTE — Progress Notes (Signed)
Pain Management Virtual Encounter Note - Virtual Visit via Telephone Telehealth (real-time audio visits between healthcare provider and patient).   Patient's Phone No. & Preferred Pharmacy:  [phone] (home); [phone] (mobile); (Preferred) [phone] [email or web address]  [id number] [address], [place] [address] Phone: [phone] Fax: [phone]  [id number] [place] [place] [address] [address] Phone: [phone] Fax: [phone]  [place] [address] [place] [address] [place] [address] Phone: [phone] Fax: [phone]    Pre-screening note:  Our staff contacted [name] and offered him an "in person", "face-to-face" appointment versus a telephone encounter. He indicated preferring the telephone encounter, at this time.   Reason for Virtual Visit: COVID-19*  Social distancing based on CDC and AMA recommendations.   I contacted [name] on 08/05/2019 via telephone.      I clearly identified myself as Gillis Santa, MD. I verified that I was speaking with the correct person using two identifiers (Name: [name], and date of birth: 02-22-1931).  Advanced Informed Consent I sought verbal advanced consent from [name] for virtual visit interactions. I informed [name] of possible security and privacy concerns, risks, and limitations associated with providing "not-in-person" medical evaluation and management services. I also informed [name] of the availability of "in-person" appointments. Finally, I informed him that there would be a charge for the virtual visit and that he could be  personally, fully or partially, financially responsible for it. [name] expressed understanding and agreed to proceed.   Historic Elements   [name] is a 85 y.o. year old, male  patient evaluated today after his last encounter by our practice on 02/09/2019. [name]  has a past medical history of Anxiety (10/11), Aortic stenosis, Bradycardia, C. difficile colitis, Carotid bruit, Chronic diastolic CHF (congestive heart failure) ([place]), Coronary artery disease, Decreased hearing, Depression, Gastric ulcer, GERD (gastroesophageal reflux disease), Hypertension, Mod-Sev Tricuspid regurgitation, RLS (restless legs syndrome) (08/23/2015), S/P AVR, and SOB (shortness of breath) (10/11). He also  has a past surgical history that includes Valve replacement (1/02); Cardiac catheterization; Joint replacement; Hernia repair; Esophagogastroduodenoscopy (N/A, 04/05/2015); Esophagogastroduodenoscopy (N/A, 04/17/2015); Esophagogastroduodenoscopy (N/A, 08/02/2015); and Total hip arthroplasty. [name] has a current medication list which includes the following prescription(s): cetirizine hcl, citalopram, clonidine, doxazosin, finasteride, isosorbide mononitrate, metoprolol succinate, potassium chloride, ranitidine, ropinirole, tamsulosin, torsemide, trazodone, warfarin, and enoxaparin. He  reports that he quit smoking about 41 years ago. His smoking use included cigarettes. He has never used smokeless tobacco. He reports current alcohol use of about 3.0 standard drinks of alcohol per week. He reports that he does not use drugs. [name] is allergic to levofloxacin and sulfa antibiotics.   HPI  Today, he is being contacted for worsening of previously known (established) problem  Patient continues to endorse chronic left knee pain.  Patient is status post left knee genicular RFA on 12/28/2018.  He states that he did obtain moderate pain relief for the first 3 months after the procedure but now has noted gradual return of his left knee pain.  We did discuss repeating the procedure however the patient is anticoagulated on Coumadin given his aortic valve replacement.  I would like to avoid the patient  discontinuing his Coumadin and bridging with Lovenox for the time being.  We can reconsider left knee genicular radiofrequency ablation 1  year after his previous one which will be February 2021.  Patient endorsed understanding and agreement with plan.  Laboratory Chemistry Profile (12 mo)  Renal: 10/27/2018: BUN/Creatinine Ratio 15 11/24/2018: BUN 16; Creatinine, Ser 0.97  Lab Results  Component Value Date   GFRAA >60 11/24/2018   GFRNONAA >60 11/24/2018   Hepatic: No results found for requested labs within last 8760 hours. Lab Results  Component Value Date   AST 23 02/22/2017   ALT 15 (L) 02/22/2017   Other: No results found for requested labs within last 8760 hours. Note: Above Lab results reviewed.   Assessment  The primary encounter diagnosis was Primary osteoarthritis of left knee. Diagnoses of Chronic pain of left knee, Chronic pain syndrome, S/P AVR (aortic valve replacement), and Warfarin anticoagulation were also pertinent to this visit.  Plan of Care  I am having [name] AGenevie Ann. Whan "Syd" maintain his tamsulosin, finasteride, citalopram, traZODone, Cetirizine HCl, ranitidine, rOPINIRole, metoprolol succinate, enoxaparin, cloNIDine, isosorbide mononitrate, warfarin, doxazosin, torsemide, and potassium chloride.  Follow-up plan:   Return if symptoms worsen or fail to improve.     Status post left genicular RFA in February 2020.  Can consider repeating in 1 year.   Recent Visits No visits were found meeting these conditions.  Showing recent visits within past 90 days and meeting all other requirements   Today's Visits Date Type Provider Dept  08/05/19 Office Visit Edward JollyLateef, [name] Karle, MD [facility]  Showing today's visits and meeting all other requirements   Future Appointments No visits were found meeting these conditions.  Showing future appointments within next 90 days and meeting all other requirements   I discussed the assessment and treatment plan with the  patient. The patient was provided an opportunity to ask questions and all were answered. The patient agreed with the plan and demonstrated an understanding of the instructions.  Patient advised to call back or seek an in-person evaluation if the symptoms or condition worsens.  Total duration of non-face-to-face encounter: 15minutes.  Note by: Edward JollyBilal Ysidro Ramsay, MD Date: 08/05/2019; Time: 12:11 PM  Note: This dictation was prepared with Dragon dictation. Any transcriptional errors that may result from this process are unintentional.  Disclaimer:  * Given the special circumstances of the COVID-19 pandemic, the federal government has announced that the Office for Civil Rights (OCR) will exercise its enforcement discretion and will not impose penalties on physicians using telehealth in the event of noncompliance with regulatory requirements under the [employer] Insurance Portability and Accountability Act (HIPAA) in connection with the good faith provision of telehealth during the COVID-19 national public health emergency. (AMA)

## 2019-08-09 ENCOUNTER — Ambulatory Visit (INDEPENDENT_AMBULATORY_CARE_PROVIDER_SITE_OTHER): Payer: Medicare Other

## 2019-08-09 ENCOUNTER — Other Ambulatory Visit: Payer: Self-pay

## 2019-08-09 DIAGNOSIS — Z7901 Long term (current) use of anticoagulants: Secondary | ICD-10-CM | POA: Diagnosis not present

## 2019-08-09 DIAGNOSIS — I350 Nonrheumatic aortic (valve) stenosis: Secondary | ICD-10-CM | POA: Diagnosis not present

## 2019-08-09 DIAGNOSIS — Z952 Presence of prosthetic heart valve: Secondary | ICD-10-CM | POA: Diagnosis not present

## 2019-08-09 LAB — POCT INR: INR: 3.6 — AB (ref 2.0–3.0)

## 2019-08-09 NOTE — Patient Instructions (Signed)
Please take 1 tablet tonight, then continue dosage of 1 tablet every day except 1.5 tablets on Mondays, Wednesdays & Fridays.  Recheck in 5 weeks.

## 2019-08-16 ENCOUNTER — Telehealth: Payer: Self-pay | Admitting: Cardiovascular Disease

## 2019-08-16 NOTE — Telephone Encounter (Signed)
Please call regarding Lasix.  Pt c/o swelling: STAT is pt has developed SOB within 24 hours  1) How much weight have you gained and in what time span? 8 pounds in 1 week  2) If swelling, where is the swelling located? Bilateral Legs and stomach  3) Are you currently taking a fluid pill? yes  Are you currently SOB?  Somewhat, but not bad Do you have a log of your daily weights (if so, list)?  no Have you gained 3 pounds in a day or 5 pounds in a week? Yes  4) Have you traveled recently? no

## 2019-08-17 NOTE — Telephone Encounter (Signed)
Patient is calling back regarding note from yesterday

## 2019-08-17 NOTE — Telephone Encounter (Signed)
Pt reports 8 lb weight gain in week although his OV notes do not reflect this.   Pt on torsemide, wt this am at home, no clothing 207 lbs Yesterday 207 or 208 lbs w/o clothing.   9/11 OV Dr. Vidal Schwalbe 208 lbs 6 oz w clothing 8/31 OV Dr. Edwina Barth 207 lbs 7/29 OV [place] 207 lbs 12.8 oz 7/20 OV Dr. Rockey Situ 207 lbs  Pt reports swelling in abdomen. Some SOBOE "about the same". Pt speaks in full sentences and no audible distress noted.  Eating normal sized meals w/o difficulty.    Leg swelling, "about the same".   I went ahead and made appt for next Monday 9/28 @ 3:20 PM to further explore weight and possible treatment options.   Urged patient to call back if sx change or worsen.   Advised pt to call for any further questions or concerns.

## 2019-08-20 ENCOUNTER — Ambulatory Visit: Payer: Medicare Other | Admitting: Physician Assistant

## 2019-08-21 NOTE — Progress Notes (Signed)
Cardiology Office Note  Date:  08/23/2019   ID:  Marquize Seib, DOB 1931/09/01, MRN [id number]  PCP:  Gracelyn Nurse, MD   Chief Complaint  Patient presents with  . other    (SOB OE) Medications reviewed verbally.     HPI:  Mr. Bither is a 85 y.o. male with  h/o aortic stenosis  s/p [employer]. [employer] mechanical aortic valve replacement in 2002 on chronic Coumadin therapy,  mild CAD by cardiac cath in 2011 managed medically,  GERD,  HTN,  carotid bruits,  Chronic hyponatremia asymptomatic bradycardia,  RLS, anxiety, depression,  GI bleed OSA on CPAP osteoarthritis s/p total left hip replacement on 03/21/2015 who was [facility] the above surgery with a hgb of 8 (baseline 12), he was started on po iron replacement therapy, who presented to on 04/02/15 with acute onset of weakness, melena, BRBPR, and was found to have a hgb of 5.1. Aspirin and warfarin initially held, restarted at a later date, S/p EGD Ejection fraction greater than 55% March 2018, moderately elevated right heart pressures He presents today for follow-up of his aortic valve replacement and hypertension  Received a phone call last week: Pt reports 8 lb weight gain in week  He does have high fluid intake, predominantly water Reports compliance with his torsemide 30 mg twice daily On this regiment continues to have abdominal bloating, leg edema Wearing his compression hose  Chronic mild shortness of breath, not very active at baseline He does eat out it would seem 1 meal per day at the [facility] [place] otherwise makes his own meals  Weight today 212 pounds, weight in the past 207 or less Or notes indicating prior goal weight in the 190 range  Lab work reviewed with him in detail Total Chol 164/ LDL 98 CR 1.1  EKG personally reviewed by myself on todays visit Shows atrial fibrillation with ventricular rate 64 bpm  Other past medical history reviewed Prior echocardiogram April 2018 Previous  Echocardiogram shows normal LV function, well-functioning aortic valve, Moderately elevated right heart pressures with severe TR  Was in the ED 08/16/17 due to HTN. Patient was treated and released.  Blood pressure was 215/100  Recently seen in the emergency room Twice for hypertension On a prior office visit I recommended he increase isosorbide to 30 mg twice a day Also was recommended to double his Diovan dose He was given 1 dose clonidine in the emergency room On a follow-up visit with ryan Dunn it was recommended he increase isosorbide to 30 mg 3 times a day, increase metoprolol up to 50 mg daily He showed up and began in the emergency room several days later  Not on hydralazine secondary to side effects Clonidine was fatigue dry mouth Prior notes indicating amlodipine 10 mg caused leg swelling,  Though he reports stopping the amlodipine did not change his swelling  48 hour Holter monitor 12/2015 Normal sinus rhythm Average heart beat 70 bpm, maximum heart rate 114 bpm, minimum 45 bpm (at 3 AM) Frequent APCs, couplets, bigeminy's with short runs, longest run of 5 beats (12% of all beats)  Previous cardiac catheterization report from 2011 reviewed with him showing 20% proximal LAD, 40% mid LAD disease  PMH:   has a past medical history of Anxiety (10/11), Aortic stenosis, Bradycardia, C. difficile colitis, Carotid bruit, Chronic diastolic CHF (congestive heart failure) ([facility]), Coronary artery disease, Decreased hearing, Depression, Gastric ulcer, GERD (gastroesophageal reflux disease), Hypertension, Mod-Sev Tricuspid regurgitation, RLS (restless legs syndrome) (08/23/2015),  S/P AVR, and SOB (shortness of breath) (10/11).  PSH:    Past Surgical History:  Procedure Laterality Date  . CARDIAC CATHETERIZATION    . ESOPHAGOGASTRODUODENOSCOPY N/A 04/05/2015   Procedure: ESOPHAGOGASTRODUODENOSCOPY (EGD);  Surgeon: Manya Silvas, MD;  Location: [facility] ENDOSCOPY;  Service: Endoscopy;   Laterality: N/A;  . ESOPHAGOGASTRODUODENOSCOPY N/A 04/17/2015   Procedure: ESOPHAGOGASTRODUODENOSCOPY (EGD);  Surgeon: Manya Silvas, MD;  Location: [facility] ENDOSCOPY;  Service: Endoscopy;  Laterality: N/A;  . ESOPHAGOGASTRODUODENOSCOPY N/A 08/02/2015   Procedure: ESOPHAGOGASTRODUODENOSCOPY (EGD);  Surgeon: Hulen Luster, MD;  Location: [facility] ENDOSCOPY;  Service: Endoscopy;  Laterality: N/A;  . HERNIA REPAIR    . JOINT REPLACEMENT    . TOTAL HIP ARTHROPLASTY    . VALVE REPLACEMENT  1/02   Aortic; echo 3/09 valve working well; echo 10/11 working well; put on Coumadin    Current Outpatient Medications on File Prior to Visit  Medication Sig Dispense Refill  . Cetirizine HCl 10 MG CAPS Take 10 mg by mouth daily.    . citalopram (CELEXA) 20 MG tablet Take 20 mg by mouth daily.    . cloNIDine (CATAPRES) 0.1 MG tablet TAKE 1 TABLET BY MOUTH TWO  TIMES DAILY 180 tablet 2  . doxazosin (CARDURA) 8 MG tablet TAKE 1 TABLET BY MOUTH  TWICE A DAY 180 tablet 3  . enoxaparin (LOVENOX) 100 MG/ML injection Inject 1 mL (100 mg total) into the skin every 12 (twelve) hours. 20 Syringe 1  . finasteride (PROSCAR) 5 MG tablet Take 5 mg by mouth daily.      . isosorbide mononitrate (IMDUR) 60 MG 24 hr tablet Take 1 tablet (60 mg total) by mouth 2 (two) times daily. 28 tablet 0  . metoprolol succinate (TOPROL-XL) 25 MG 24 hr tablet Take 0.5 tablets (12.5 mg total) by mouth daily. 90 tablet 3  . potassium chloride (K-DUR) 10 MEQ tablet Take 3 tablets (30 mEq total) by mouth daily. 180 tablet 3  . ranitidine (ZANTAC) 150 MG tablet Take 150 mg by mouth as needed.    Marland Kitchen rOPINIRole (REQUIP) 2 MG tablet TAKE 1 TABLET BY MOUTH 4  TIMES DAILY 360 tablet 3  . Tamsulosin HCl (FLOMAX) 0.4 MG CAPS Take 0.4 mg by mouth at bedtime.     . torsemide (DEMADEX) 20 MG tablet Take 1.5 tablets (30 mg) by mouth twice daily. Hold for weight <190 pounds 270 tablet 3  . traZODone (DESYREL) 50 MG tablet Take 50 mg by mouth at bedtime.    Marland Kitchen warfarin  (COUMADIN) 5 MG tablet Take 1 tablet daily except 1 and 1/2 tabs on Monday, Wednesday, and Friday or as directed by Coumadin Clinic. 120 tablet 1   No current facility-administered medications on file prior to visit.     Allergies:   Levofloxacin and Sulfa antibiotics   Social History:  The patient  reports that he quit smoking about 41 years ago. His smoking use included cigarettes. He has never used smokeless tobacco. He reports current alcohol use of about 3.0 standard drinks of alcohol per week. He reports that he does not use drugs.   Family History:   family history includes Diabetes type II in his mother; Heart attack in his mother; Heart disease in his father and mother; Hypertension in his father, mother, and another family member; Stroke in his brother, brother, and father.    Review of Systems: Review of Systems  Constitutional: Negative.   Eyes: Negative.   Respiratory: Negative.   Cardiovascular: Positive for  leg swelling.  Gastrointestinal: Negative.   Genitourinary: Negative.   Musculoskeletal: Negative.   Neurological: Negative.   Psychiatric/Behavioral: Negative.   All other systems reviewed and are negative.   PHYSICAL EXAM: VS:  BP 138/78 (BP Location: Left Arm, Patient Position: Sitting, Cuff Size: Normal)   Pulse 64   Ht 5\' 11"  (1.803 m)   Wt 212 lb (96.2 kg)   BMI 29.57 kg/m  , BMI Body mass index is 29.57 kg/m.  Presents with a walker today. Constitutional:  oriented to person, place, and time. No distress.  HENT:  Head: Grossly normal Eyes:  no discharge. No scleral icterus.  Neck: No JVD, no carotid bruits  Cardiovascular: Regular rate and rhythm, no murmurs appreciated 1+ pitting edema to the thighs appreciated bilaterally Pulmonary/Chest: Clear to auscultation bilaterally, no wheezes or rales Abdominal: Soft.  no distension.  no tenderness.  Musculoskeletal: Normal range of motion Neurological:  normal muscle tone. Coordination normal. No  atrophy Skin: Skin warm and dry Psychiatric: normal affect, pleasant  Recent Labs: 10/27/2018: B Natriuretic Peptide 467.0 11/24/2018: BUN 16; Creatinine, Ser 0.97; Hemoglobin 12.3; Platelets 145; Potassium 4.0; Sodium 131   Lipid Panel No results found for: CHOL, HDL, LDLCALC, TRIG    Filed Weights   08/23/19 1520  Weight: 212 lb (96.2 kg)    ASSESSMENT AND PLAN:  Acute on chronic diastolic CHF Weight is higher, significant pitting leg edema to the lateral thighs into the abdomen Strong recommendation to decrease his fluid intake Currently taking torsemide 30 twice daily with weight gain and edema Recommended he increase torsemide up to 40 twice daily Also add metolazone 2.5 mg 3 days a week until leg edema resolves and weight loss tach to baseline likely close to 200.  Currently 212.  Currently takes potassium 30 daily, will increase this up to 50 mill equivalents when he takes metolazone with torsemide in the mornings  Essential hypertension - Blood pressure is well controlled on today's visit.  Extra diuretics as above  Coronary artery disease involving native coronary artery of native heart without angina pectoris Currently with no symptoms of angina. No further workup at this time. Continue current medication regimen.  Atrial flutter, typical Previously declined cardioversion for atrial fibrillation, Rate control, anticoagulation  S/P AVR (aortic valve replacement) Well-functioning valve December 2019 No further work-up at this time  SOB (shortness of breath) Plan as above, aggressive diuresis, modify fluid intake  Lower extremity edema Wears compression hose, torsemide as above Now with worsening pitting edema to lateral thighs with weight gain   Total encounter time more than 45 minutes  Greater than 50% was spent in counseling and coordination of care with the patient  Disposition:   F/U  2 month    Signed, Dossie Arbourim Eathen Budreau, M.D., Ph.D. 08/23/2019  [facility] [place] [phone]

## 2019-08-23 ENCOUNTER — Encounter: Payer: Self-pay | Admitting: Cardiovascular Disease

## 2019-08-23 ENCOUNTER — Other Ambulatory Visit: Payer: Self-pay

## 2019-08-23 ENCOUNTER — Ambulatory Visit (INDEPENDENT_AMBULATORY_CARE_PROVIDER_SITE_OTHER): Payer: Medicare Other | Admitting: Cardiovascular Disease

## 2019-08-23 VITALS — BP 138/78 | HR 64 | Ht 71.0 in | Wt 212.0 lb

## 2019-08-23 DIAGNOSIS — I4892 Unspecified atrial flutter: Secondary | ICD-10-CM | POA: Diagnosis not present

## 2019-08-23 DIAGNOSIS — I25118 Atherosclerotic heart disease of native coronary artery with other forms of angina pectoris: Secondary | ICD-10-CM | POA: Diagnosis not present

## 2019-08-23 DIAGNOSIS — I350 Nonrheumatic aortic (valve) stenosis: Secondary | ICD-10-CM | POA: Diagnosis not present

## 2019-08-23 DIAGNOSIS — I5032 Chronic diastolic (congestive) heart failure: Secondary | ICD-10-CM

## 2019-08-23 DIAGNOSIS — Z952 Presence of prosthetic heart valve: Secondary | ICD-10-CM

## 2019-08-23 MED ORDER — TORSEMIDE 20 MG PO TABS: ORAL_TABLET | ORAL | 3 refills | Status: DC

## 2019-08-23 MED ORDER — METOLAZONE 2.5 MG PO TABS: ORAL_TABLET | ORAL | 0 refills | Status: DC

## 2019-08-23 MED ORDER — METOLAZONE 2.5 MG PO TABS: 2.5000 mg | ORAL_TABLET | Freq: Every day | ORAL | 0 refills | Status: DC

## 2019-08-23 MED ORDER — POTASSIUM CHLORIDE ER 10 MEQ PO TBCR: 30.0000 meq | EXTENDED_RELEASE_TABLET | Freq: Every day | ORAL | 3 refills | Status: DC

## 2019-08-23 NOTE — Patient Instructions (Addendum)
Cut down on your fluids Watch the sodium   Medication Instructions:  1. Increase the torsemide up to 40 mg twice a day (Prescription will read "Take two tablets(40MG ) by mouth TWICE a day)  2. Take metolazone 2.5 mg in the morning only three times a week 30 mins before torsemide.   3. Take with additional TWO potassiums (total of 5 of the 10 meq pills that day) only on days with metolazone.   4. Once the edema has resolved, only take the metolazone as needed for weight gain  If you need a refill on your cardiac medications before your next appointment, please call your pharmacy.    Lab work: 1. No new labs needed   If you have labs (blood work) drawn today and your tests are completely normal, you will receive your results only by: Marland Kitchen MyChart Message (if you have MyChart) OR . A paper copy in the mail If you have any lab test that is abnormal or we need to change your treatment, we will call you to review the results.   Testing/Procedures: 1. No new testing needed   Follow-Up: At [facility], you and your health needs are our priority.  As part of our continuing mission to provide you with exceptional heart care, we have created designated Provider Care Teams.  These Care Teams include your primary Cardiologist (physician) and Advanced Practice Providers (APPs -  Physician Assistants and Nurse Practitioners) who all work together to provide you with the care you need, when you need it.  . You will need a follow up appointment in 2 months .   Please call our office 2 months in advance to schedule this appointment.    . Providers on your designated Care Team:   . Murray Hodgkins, NP . Christell Faith, PA-C . Marrianne Mood, PA-C  Any Other Special Instructions Will Be Listed Below (If Applicable).  For educational health videos Log in to : [email or web address] Or : [email or web address], password : [email or web address]

## 2019-08-30 ENCOUNTER — Other Ambulatory Visit: Payer: Self-pay

## 2019-08-30 DIAGNOSIS — Z20822 Contact with and (suspected) exposure to covid-19: Secondary | ICD-10-CM

## 2019-08-30 DIAGNOSIS — Z20828 Contact with and (suspected) exposure to other viral communicable diseases: Secondary | ICD-10-CM

## 2019-09-01 LAB — NOVEL CORONAVIRUS, NAA: SARS-CoV-2, NAA: DETECTED — AB

## 2019-09-06 ENCOUNTER — Other Ambulatory Visit: Payer: Self-pay | Admitting: Adult Health

## 2019-09-15 ENCOUNTER — Other Ambulatory Visit: Payer: Self-pay

## 2019-09-15 ENCOUNTER — Ambulatory Visit (INDEPENDENT_AMBULATORY_CARE_PROVIDER_SITE_OTHER): Payer: Medicare Other

## 2019-09-15 DIAGNOSIS — Z7901 Long term (current) use of anticoagulants: Secondary | ICD-10-CM | POA: Diagnosis not present

## 2019-09-15 DIAGNOSIS — I350 Nonrheumatic aortic (valve) stenosis: Secondary | ICD-10-CM

## 2019-09-15 DIAGNOSIS — Z952 Presence of prosthetic heart valve: Secondary | ICD-10-CM | POA: Diagnosis not present

## 2019-09-15 LAB — POCT INR: INR: 3 (ref 2.0–3.0)

## 2019-09-15 NOTE — Patient Instructions (Signed)
Please continue dosage of 1 tablet every day except 1.5 tablets on Mondays, Wednesdays & Fridays.  Recheck in 6 weeks.

## 2019-10-07 ENCOUNTER — Telehealth: Payer: Self-pay | Admitting: Cardiovascular Disease

## 2019-10-07 NOTE — Telephone Encounter (Signed)
LVM for patient to call back. ?

## 2019-10-07 NOTE — Telephone Encounter (Signed)
Started on doxycycline on 11/11 for 7 days. Scheduler moved appointment to Monday. Will have INR checked then with [facility]

## 2019-10-07 NOTE — Telephone Encounter (Signed)
If Home Health RN is calling please get Coumadin Nurse on the phone STAT  1.  Are you calling in regards to an appointment? Yes   2.  Are you calling for a refill ? No   3.  Are you having bleeding issues? no  4.  Do you need clearance to hold Coumadin? No   On abx and was told to come into office for sooner appt.   Scheduled from 11/16   Please route to the Coumadin Clinic Pool.

## 2019-10-11 ENCOUNTER — Ambulatory Visit (INDEPENDENT_AMBULATORY_CARE_PROVIDER_SITE_OTHER): Payer: Medicare Other

## 2019-10-11 ENCOUNTER — Other Ambulatory Visit: Payer: Self-pay

## 2019-10-11 DIAGNOSIS — Z7901 Long term (current) use of anticoagulants: Secondary | ICD-10-CM

## 2019-10-11 DIAGNOSIS — Z952 Presence of prosthetic heart valve: Secondary | ICD-10-CM

## 2019-10-11 DIAGNOSIS — I350 Nonrheumatic aortic (valve) stenosis: Secondary | ICD-10-CM | POA: Diagnosis not present

## 2019-10-11 LAB — POCT INR: INR: 3.8 — AB (ref 2.0–3.0)

## 2019-10-11 NOTE — Patient Instructions (Signed)
Please skip warfarin tonight, then continue dosage of 1 tablet every day except 1.5 tablets on Mondays, Wednesdays & Fridays.  Recheck in as scheduled in 2 weeks at your appt w/ Dr. Rockey Situ.

## 2019-10-24 NOTE — Progress Notes (Signed)
Cardiology Office Note  Date:  10/25/2019   ID:  [name], DOB [birth year], MRN [id number]  PCP:  Gracelyn NurseJohnston, [name] D, MD   Chief Complaint  Patient presents with  . Other    2 month follow up. Patient c/o some SOB and swelling in ankles. Meds reviewed verbally with patient.     HPI:  [name] is a 85 y.o. male with  h/o aortic stenosis  s/p St. Jude mechanical aortic valve replacement in 2002 on chronic Coumadin therapy,  mild CAD by cardiac cath in 2011 managed medically,  GERD,  HTN,  carotid bruits,  Chronic hyponatremia asymptomatic bradycardia,  RLS, anxiety, depression,  GI bleed OSA on CPAP osteoarthritis s/p total left hip replacement on 03/21/2015 who was discharged from [facility] the above surgery with a hgb of 8 (baseline 12), he was started on po iron replacement therapy, who presented to on 04/02/15 with acute onset of weakness, melena, BRBPR, and was found to have a hgb of 5.1. Aspirin and warfarin initially held, restarted at a later date, S/p EGD Ejection fraction greater than 55% March 2018, moderately elevated right heart pressures He presents today for follow-up of his aortic valve replacement and hypertension  Last office visit 07/2019 Weight is higher, significant pitting leg edema to the lateral thighs into the abdomen Strong recommendation to decrease his fluid intake Currently taking torsemide 30 twice daily with weight gain and edema Recommended he increase torsemide up to 40 twice daily Also add metolazone 2.5 mg 3 days a week until leg edema resolves and weight loss tach to baseline likely close to 200.  Currently 212.  Currently takes potassium 30 daily, will increase this up to 50 mill equivalents when he takes metolazone with torsemide in the mornings  Taking metolazone 2-3 x a week Torsemide 40 BID Weight down 6 pounds  Home weight 194 In office 206, down from 212  Slight ABD bloating, improved Has wraps on left leg Compression on  right leg   not very active at baseline Presents today with a wheelchair  No recent BMP  Not on hydralazine secondary to side effects Clonidine was fatigue dry mouth Prior notes indicating amlodipine 10 mg caused leg swelling,   EKG personally reviewed by myself on todays visit Shows atrial fibrillation with ventricular rate 52 bpm  Other past medical history reviewed Prior echocardiogram April 2018 Previous Echocardiogram shows normal LV function, well-functioning aortic valve, Moderately elevated right heart pressures with severe TR  Was in the ED 08/16/17 due to HTN. Patient was treated and released.  Blood pressure was 215/100 He was given 1 dose clonidine in the emergency room Medication changes made   Past Surgical History:  Procedure Laterality Date  . CARDIAC CATHETERIZATION    . ESOPHAGOGASTRODUODENOSCOPY N/A 04/05/2015   Procedure: ESOPHAGOGASTRODUODENOSCOPY (EGD);  Surgeon: [name] Junobert T Elliott, MD;  [facility];  Service: Endoscopy;  Laterality: N/A;  . ESOPHAGOGASTRODUODENOSCOPY N/A 04/17/2015   Procedure: ESOPHAGOGASTRODUODENOSCOPY (EGD);  Surgeon: [name] Junobert T Elliott, MD;  [facility];  Service: Endoscopy;  Laterality: N/A;  . ESOPHAGOGASTRODUODENOSCOPY N/A 08/02/2015   Procedure: ESOPHAGOGASTRODUODENOSCOPY (EGD);  Surgeon: [name], MD;  [facility];  Service: Endoscopy;  Laterality: N/A;  . HERNIA REPAIR    . JOINT REPLACEMENT    . TOTAL HIP ARTHROPLASTY    . VALVE REPLACEMENT  1/02   Aortic; echo 3/09 valve working well; echo 10/11 working well; put on Coumadin    Current Outpatient Medications on File Prior to  Visit  Medication Sig Dispense Refill  . cephALEXin (KEFLEX) 500 MG capsule Take 500 mg by mouth 3 (three) times daily.    . Cetirizine HCl 10 MG CAPS Take 10 mg by mouth daily.    . citalopram (CELEXA) 20 MG tablet Take 20 mg by mouth daily.    . cloNIDine (CATAPRES) 0.1 MG tablet TAKE 1 TABLET BY MOUTH TWO  TIMES  DAILY 180 tablet 2  . doxazosin (CARDURA) 8 MG tablet TAKE 1 TABLET BY MOUTH  TWICE A DAY 180 tablet 3  . enoxaparin (LOVENOX) 100 MG/ML injection Inject 1 mL (100 mg total) into the skin every 12 (twelve) hours. 20 Syringe 1  . finasteride (PROSCAR) 5 MG tablet Take 5 mg by mouth daily.      . isosorbide mononitrate (IMDUR) 60 MG 24 hr tablet Take 1 tablet (60 mg total) by mouth 2 (two) times daily. 28 tablet 0  . metolazone (ZAROXOLYN) 2.5 MG tablet Take 1 tablet (2.5MG ) by mouth THREE times a WEEK 90 tablet 0  . metoprolol succinate (TOPROL-XL) 25 MG 24 hr tablet Take 0.5 tablets (12.5 mg total) by mouth daily. 90 tablet 3  . potassium chloride (K-DUR) 10 MEQ tablet Take 3 tablets (30 mEq total) by mouth daily. Take an additional only on the days you take metolazone. 180 tablet 3  . ranitidine (ZANTAC) 150 MG tablet Take 150 mg by mouth as needed.    Marland Kitchen rOPINIRole (REQUIP) 2 MG tablet TAKE 1 TABLET BY MOUTH 4  TIMES DAILY 360 tablet 3  . Tamsulosin HCl (FLOMAX) 0.4 MG CAPS Take 0.4 mg by mouth at bedtime.     . torsemide (DEMADEX) 20 MG tablet Take TWO tablets by mouth TWICE a day. 270 tablet 3  . traZODone (DESYREL) 50 MG tablet Take 50 mg by mouth at bedtime.    Marland Kitchen warfarin (COUMADIN) 5 MG tablet Take 1 tablet daily except 1 and 1/2 tabs on Monday, Wednesday, and Friday or as directed by Coumadin Clinic. 120 tablet 1   No current facility-administered medications on file prior to visit.     Allergies:   Levofloxacin and Sulfa antibiotics   Social History:  The patient  reports that he quit smoking about 41 years ago. His smoking use included cigarettes. He has never used smokeless tobacco. He reports current alcohol use of about 3.0 standard drinks of alcohol per week. He reports that he does not use drugs.   Family History:   family history includes Diabetes type II in his mother; Heart attack in his mother; Heart disease in his father and mother; Hypertension in his father, mother,  and another family member; Stroke in his brother, brother, and father.    Review of Systems: Review of Systems  Constitutional: Negative.   Eyes: Negative.   Respiratory: Negative.   Cardiovascular: Positive for leg swelling.  Gastrointestinal: Negative.   Genitourinary: Negative.   Musculoskeletal: Negative.   Neurological: Negative.   Psychiatric/Behavioral: Negative.   All other systems reviewed and are negative.   PHYSICAL EXAM: VS:  BP 110/60 (BP Location: Right Arm, Patient Position: Sitting, Cuff Size: Normal)   Pulse (!) 52   Ht 5\' 11"  (1.803 m)   Wt 206 lb (93.4 kg)   BMI 28.73 kg/m  , BMI Body mass index is 28.73 kg/m.  Constitutional:  oriented to person, place, and time. No distress.  HENT:  Head: Grossly normal Eyes:  no discharge. No scleral icterus.  Neck: No JVD, no carotid  bruits  Cardiovascular: Regular rate and rhythm, no murmurs appreciated Wraps on left leg, compression hose right leg No edema in the thighs Pulmonary/Chest: Clear to auscultation bilaterally, no wheezes or rails Abdominal: Soft.  no distension.  no tenderness.  Musculoskeletal: Normal range of motion Neurological:  normal muscle tone. Coordination normal. No atrophy Skin: Skin warm and dry Psychiatric: normal affect, pleasant  Recent Labs: 10/27/2018: B Natriuretic Peptide 467.0 11/24/2018: BUN 16; Creatinine, Ser 0.97; Hemoglobin 12.3; Platelets 145; Potassium 4.0; Sodium 131   Lipid Panel No results found for: CHOL, HDL, LDLCALC, TRIG    Filed Weights   10/25/19 1106  Weight: 206 lb (93.4 kg)    ASSESSMENT AND PLAN:  Acute on chronic diastolic CHF Weight down 6 pounds on current regiment Torsemide 40 twice daily with metolazone 2.52 or 3 days a week Still has high fluid intake, discussed with him BMP today -Continue current medications Goal weight likely 200 pounds in our clinic, 190 at his home  Essential hypertension - Blood pressure is well controlled on today's  visit. No changes made to the medications.  Coronary artery disease involving native coronary artery of native heart without angina pectoris Denies any anginal symptoms No medication changes made  Atrial flutter, typical/atrial fibrillation Previously declined cardioversion for atrial fibrillation, Rate control, anticoagulation  Tolerating anticoagulation  S/P AVR (aortic valve replacement) Well-functioning valve December 2019 We will need periodic echo, will schedule for 2021  SOB (shortness of breath) Bruising improved, very sedentary  Lower extremity edema Wears compression hose, torsemide as above Edema is improving   Total encounter time more than 25 minutes  Greater than 50% was spent in counseling and coordination of care with the patient  Disposition:   F/U  2 month    Signed, Esmond Plants, M.D., Ph.D. 10/25/2019  [place], [place] [phone]

## 2019-10-25 ENCOUNTER — Ambulatory Visit (INDEPENDENT_AMBULATORY_CARE_PROVIDER_SITE_OTHER): Payer: Medicare Other

## 2019-10-25 ENCOUNTER — Ambulatory Visit (INDEPENDENT_AMBULATORY_CARE_PROVIDER_SITE_OTHER): Payer: Medicare Other | Admitting: Cardiovascular Disease

## 2019-10-25 ENCOUNTER — Encounter: Payer: Self-pay | Admitting: Cardiovascular Disease

## 2019-10-25 ENCOUNTER — Other Ambulatory Visit: Payer: Self-pay

## 2019-10-25 VITALS — BP 110/60 | HR 52 | Ht 71.0 in | Wt 206.0 lb

## 2019-10-25 DIAGNOSIS — I350 Nonrheumatic aortic (valve) stenosis: Secondary | ICD-10-CM

## 2019-10-25 DIAGNOSIS — I4892 Unspecified atrial flutter: Secondary | ICD-10-CM | POA: Diagnosis not present

## 2019-10-25 DIAGNOSIS — Z952 Presence of prosthetic heart valve: Secondary | ICD-10-CM

## 2019-10-25 DIAGNOSIS — I5032 Chronic diastolic (congestive) heart failure: Secondary | ICD-10-CM

## 2019-10-25 DIAGNOSIS — I25118 Atherosclerotic heart disease of native coronary artery with other forms of angina pectoris: Secondary | ICD-10-CM

## 2019-10-25 DIAGNOSIS — G4733 Obstructive sleep apnea (adult) (pediatric): Secondary | ICD-10-CM

## 2019-10-25 DIAGNOSIS — Z7901 Long term (current) use of anticoagulants: Secondary | ICD-10-CM

## 2019-10-25 LAB — POCT INR: INR: 4.4 — AB (ref 2.0–3.0)

## 2019-10-25 NOTE — Patient Instructions (Addendum)
Medication Instructions:  No changes  If you need a refill on your cardiac medications before your next appointment, please call your pharmacy.   Lab work: [employer] today   If you have labs (blood work) drawn today and your tests are completely normal, you will receive your results only by: Marland Kitchen MyChart Message (if you have MyChart) OR . A paper copy in the mail If you have any lab test that is abnormal or we need to change your treatment, we will call you to review the results.   Testing/Procedures: No new testing needed   Follow-Up: At [facility], you and your health needs are our priority.  As part of our continuing mission to provide you with exceptional heart care, we have created designated Provider Care Teams.  These Care Teams include your primary Cardiologist (physician) and Advanced Practice Providers (APPs -  Physician Assistants and Nurse Practitioners) who all work together to provide you with the care you need, when you need it.  . You will need a follow up appointment in 2 months  . Providers on your designated Care Team:   . Murray Hodgkins, NP . Christell Faith, PA-C . Marrianne Mood, PA-C  Any Other Special Instructions Will Be Listed Below (If Applicable).  For educational health videos Log in to : [email or web address] Or : [email or web address], password : [email or web address]

## 2019-10-25 NOTE — Patient Instructions (Signed)
Please skip warfarin tonight & tomorrow, then continue dosage of 1 tablet every day except 1.5 tablets on Mondays, Wednesdays & Fridays.  Recheck INR in 2 weeks.

## 2019-10-26 LAB — BASIC METABOLIC PANEL
BUN/Creatinine Ratio: 30 — ABNORMAL HIGH (ref 10–24)
BUN: 35 mg/dL — ABNORMAL HIGH (ref 8–27)
CO2: 27 mmol/L (ref 20–29)
Calcium: 9.4 mg/dL (ref 8.6–10.2)
Chloride: 92 mmol/L — ABNORMAL LOW (ref 96–106)
Creatinine, Ser: 1.18 mg/dL (ref 0.76–1.27)
GFR calc Af Amer: 64 mL/min/{1.73_m2} (ref >59)
GFR calc non Af Amer: 56 mL/min/{1.73_m2} — ABNORMAL LOW (ref >59)
Glucose: 84 mg/dL (ref 65–99)
Potassium: 4 mmol/L (ref 3.5–5.2)
Sodium: 139 mmol/L (ref 134–144)

## 2019-10-28 ENCOUNTER — Telehealth: Payer: Self-pay

## 2019-10-28 NOTE — Telephone Encounter (Signed)
Attempted to call patient. LMTCB 10/28/2019   

## 2019-10-28 NOTE — Telephone Encounter (Signed)
-----   Message from Minna Merritts, MD sent at 10/27/2019  9:41 PM EST ----- Labs Looks a little dry but acceptable Would not push weight much lower  On last office visit when labs drawn weight in office 206, down from 212

## 2019-10-28 NOTE — Telephone Encounter (Signed)
Spoke to patient to discuss lab results. No further orders at this time.   Weight w/o clothes this morning was 194 lbs.   Routing to provider to make aware.

## 2019-11-02 ENCOUNTER — Ambulatory Visit (INDEPENDENT_AMBULATORY_CARE_PROVIDER_SITE_OTHER): Payer: Medicare Other | Admitting: Vascular Surgery

## 2019-11-02 ENCOUNTER — Other Ambulatory Visit: Payer: Self-pay

## 2019-11-02 ENCOUNTER — Encounter (INDEPENDENT_AMBULATORY_CARE_PROVIDER_SITE_OTHER): Payer: Self-pay | Admitting: Vascular Surgery

## 2019-11-02 VITALS — BP 159/73 | HR 80 | Resp 16 | Ht 71.0 in | Wt 204.0 lb

## 2019-11-02 DIAGNOSIS — I10 Essential (primary) hypertension: Secondary | ICD-10-CM

## 2019-11-02 DIAGNOSIS — M25559 Pain in unspecified hip: Secondary | ICD-10-CM | POA: Insufficient documentation

## 2019-11-02 DIAGNOSIS — L97321 Non-pressure chronic ulcer of left ankle limited to breakdown of skin: Secondary | ICD-10-CM | POA: Diagnosis not present

## 2019-11-02 DIAGNOSIS — I350 Nonrheumatic aortic (valve) stenosis: Secondary | ICD-10-CM | POA: Diagnosis not present

## 2019-11-02 NOTE — Progress Notes (Signed)
Patient ID: [name] BergerSydnor Arnold [name], male   DOB: [birth year], 85 y.o.   MRN: [id number]  Chief Complaint  Patient presents with  . New Patient (Initial Visit)    venous stasis ulceration    HPI [name] is a 85 y.o. male.  I am asked to see the patient by Dr. Sampson GoonFitzgerald for evaluation of venous stasis ulceration.  The patient has been treated twice for cellulitis on that leg and once this was cultured with MRSA.  The patient reports only mild discomfort at the site.  He has some prominent stasis changes present on both legs and has been on Coumadin for almost 20 years after a mechanical aortic valve replacement.  The patient reports no trauma or injury to the area.  The wound above the medial left ankle has been very sore to heal.  There is no clear inciting event or causative factor that started the symptoms.  His stasis changes are actually worse on the right than the left but he does have prominent varicosities bilaterally.  Despite appropriate antibiotics the wound has been very slow to heal, and his primary care physician noted astutely that he had not had any vascular assessment that could be contributing to the poor wound healing.  The patient denies a previous history of vascular intervention or surgery to his knowledge.     Past Medical History:  Diagnosis Date  . Anxiety 10/11  . Aortic stenosis    a. s/p mechcanical AVR, 2002; b. 10/2018 Echo: Triv AI, mean grad 9mmHg.  . Bradycardia    chronic, no symptoms 07/2010  . C. difficile colitis   . Carotid bruit    dopplers in past, no abnormalities  . Chronic diastolic CHF (congestive heart failure) (HCC)    a. Echo 03/2015: EF 60-65%, no RWMA, GR1DD, mild BAE, mild to mod MR, mod TR, PASP 65 mmHg; b. 01/2017 Echo: EF 55-60%, NRWMA, grade 1 diastolic dysfunction.  Normal functioning prosthetic aortic valve.  Mean gradient 50 mmHg.  Sev TR. PASP 54mmHg; c. 10/2018 Echo: EF 55-60%, Triv AI, mod dil LA, mod-sev TR, PASP 35-40,  mild to mod red RV fxn.  . Coronary artery disease    a. mild, cath, 08/2010; b. medically managed  . Decreased hearing    Right ear  . Depression   . Gastric ulcer   . GERD (gastroesophageal reflux disease)   . Hypertension    BP higher than usual 04/19/10; amlodipine increased by telephone  . Mod-Sev Tricuspid regurgitation    a. 10/2018 Echo: Mod-Sev TR, PASP 35-6640mmHg.  Marland Kitchen. RLS (restless legs syndrome) 08/23/2015  . S/P AVR    a. St. Jude. mechanical 2002; b. echo 08/2010 EF 60%, trival AI, mild MR, AVR working well; c. on longterm warfarin tx  . SOB (shortness of breath) 10/11   08/2010,Episodes at 5 AM, eventually felt to be anxiety, after complete workup including catheterization, pt greatly improved with anxiety meds 11/11    Past Surgical History:  Procedure Laterality Date  . CARDIAC CATHETERIZATION    . ESOPHAGOGASTRODUODENOSCOPY N/A 04/05/2015   Procedure: ESOPHAGOGASTRODUODENOSCOPY (EGD);  Surgeon: [name], MD;  [facility];  Service: Endoscopy;  Laterality: N/A;  . ESOPHAGOGASTRODUODENOSCOPY N/A 04/17/2015   Procedure: ESOPHAGOGASTRODUODENOSCOPY (EGD);  Surgeon: [name], MD;  [facility];  Service: Endoscopy;  Laterality: N/A;  . ESOPHAGOGASTRODUODENOSCOPY N/A 08/02/2015   Procedure: ESOPHAGOGASTRODUODENOSCOPY (EGD);  Surgeon: [name], MD;  [facility];  Service: Endoscopy;  Laterality:  N/A;  . HERNIA REPAIR    . JOINT REPLACEMENT    . TOTAL HIP ARTHROPLASTY    . VALVE REPLACEMENT  1/02   Aortic; echo 3/09 valve working well; echo 10/11 working well; put on Coumadin     Family History  Problem Relation Age of Onset  . Hypertension Mother   . Diabetes type II Mother   . Heart disease Mother   . Heart attack Mother   . Hypertension Father   . Heart disease Father   . Stroke Father   . Stroke Brother   . Stroke Brother   . Hypertension Other     Social History   Tobacco Use  . Smoking status: Former  Smoker    Types: Cigarettes    Quit date: 1979    Years since quitting: 41.9  . Smokeless tobacco: Never Used  Substance Use Topics  . Alcohol use: Yes    Alcohol/week: 3.0 standard drinks    Types: 3 Glasses of wine per week    Comment: Social  . Drug use: No    Allergies  Allergen Reactions  . Levofloxacin Nausea Only  . Sulfa Antibiotics Other (See Comments)    Reaction:  Unknown     Current Outpatient Medications  Medication Sig Dispense Refill  . Cetirizine HCl 10 MG CAPS Take 10 mg by mouth daily.    . citalopram (CELEXA) 20 MG tablet Take 20 mg by mouth daily.    . cloNIDine (CATAPRES) 0.1 MG tablet TAKE 1 TABLET BY MOUTH TWO  TIMES DAILY 180 tablet 2  . doxazosin (CARDURA) 8 MG tablet TAKE 1 TABLET BY MOUTH  TWICE A DAY 180 tablet 3  . finasteride (PROSCAR) 5 MG tablet Take 5 mg by mouth daily.      . isosorbide mononitrate (IMDUR) 60 MG 24 hr tablet Take 1 tablet (60 mg total) by mouth 2 (two) times daily. 28 tablet 0  . metolazone (ZAROXOLYN) 2.5 MG tablet Take 1 tablet (2.5MG ) by mouth THREE times a WEEK 90 tablet 0  . metoprolol succinate (TOPROL-XL) 25 MG 24 hr tablet Take 0.5 tablets (12.5 mg total) by mouth daily. 90 tablet 3  . potassium chloride (K-DUR) 10 MEQ tablet Take 3 tablets (30 mEq total) by mouth daily. Take an additional 36meq only on the days you take metolazone. 180 tablet 3  . rOPINIRole (REQUIP) 2 MG tablet TAKE 1 TABLET BY MOUTH 4  TIMES DAILY 360 tablet 3  . Tamsulosin HCl (FLOMAX) 0.4 MG CAPS Take 0.4 mg by mouth at bedtime.     . torsemide (DEMADEX) 20 MG tablet Take TWO tablets by mouth TWICE a day. 270 tablet 3  . traZODone (DESYREL) 50 MG tablet Take 50 mg by mouth at bedtime.    Marland Kitchen warfarin (COUMADIN) 5 MG tablet Take 1 tablet daily except 1 and 1/2 tabs on Monday, Wednesday, and Friday or as directed by Coumadin Clinic. 120 tablet 1  . cephALEXin (KEFLEX) 500 MG capsule Take 500 mg by mouth 3 (three) times daily.    Marland Kitchen enoxaparin (LOVENOX)  100 MG/ML injection Inject 1 mL (100 mg total) into the skin every 12 (twelve) hours. (Patient not taking: Reported on 11/02/2019) 20 Syringe 1  . ranitidine (ZANTAC) 150 MG tablet Take 150 mg by mouth as needed.     No current facility-administered medications for this visit.       REVIEW OF SYSTEMS (Negative unless checked)  Constitutional: [] Weight loss  [] Fever  [] Chills Cardiac: [x] Chest  pain   Chest pressure   Palpitations   Shortness of breath when laying flat   Shortness of breath at rest   Shortness of breath with exertion. Vascular:  Pain in legs with walking   Pain in legs at rest   Pain in legs when laying flat   Claudication   Pain in feet when walking  Pain in feet at rest  Pain in feet when laying flat   History of DVT   Phlebitis   Swelling in legs   Varicose veins   Non-healing ulcers Pulmonary:   Uses home oxygen   Productive cough   Hemoptysis   Wheeze  COPD   Asthma Neurologic:  Dizziness  Blackouts   Seizures   History of stroke   History of TIA  Aphasia   Temporary blindness   Dysphagia   Weakness or numbness in arms   Weakness or numbness in legs Musculoskeletal:  Arthritis   Joint swelling   Joint pain   Low back pain Hematologic:  Easy bruising  Easy bleeding   Hypercoagulable state   Anemic  Hepatitis Gastrointestinal:  Blood in stool   Vomiting blood  Gastroesophageal reflux/heartburn   Abdominal pain Genitourinary:  Chronic kidney disease   Difficult urination  Frequent urination  Burning with urination   Hematuria Skin:  Rashes   Ulcers   Wounds Psychological:  History of anxiety    History of major depression.    Physical Exam BP (!) 159/73 (BP Location: Right Arm)   Pulse 80   Resp 16   Ht  (1.803 m)   Wt 204 lb (92.5 kg)   BMI 28.45 kg/m  Gen:  WD/WN, NAD Head: [place]/AT, No temporalis wasting.  Ear/Nose/Throat: Hearing grossly  intact, nares w/o erythema or drainage, oropharynx w/o Erythema/Exudate Eyes: Conjunctiva clear, sclera non-icteric  Neck: trachea midline.  No JVD.  Pulmonary:  Good air movement, respirations not labored, no use of accessory muscles  Cardiac: RRR, no JVD Vascular:  Vessel Right Left  Radial Palpable Palpable                          DP  2+  trace  PT  1+  1+   Gastrointestinal:. No masses, surgical incisions, or scars. Musculoskeletal: M/S 5/5 throughout.  Extremities without ischemic changes.  No deformity or atrophy.  Moderate to severe stasis dermatitis changes present on the right, moderate stasis dermatitis changes present on the left with mild erythema.  1+ bilateral lower extremity edema.  Quarter sized ulceration just above the medial left ankle with dark eschar present Neurologic: Sensation grossly intact in extremities.  Symmetrical.  Speech is fluent. Motor exam as listed above. Psychiatric: Judgment intact, Mood & affect appropriate for pt's clinical situation. Dermatologic: Ulceration just above the left medial ankle as above    Radiology No results found.  Labs Recent Results (from the past 2160 hour(s))  POCT INR     Status: Abnormal   Collection Time: 08/09/19 11:44 AM  Result Value Ref Range   INR 3.6 (A) 2.0 - 3.0  Novel Coronavirus, NAA ([facility])     Status: Abnormal   Collection Time: 08/30/19 11:28 AM   Specimen: Nasopharyngeal(NP) swabs in vial transport medium   NASOPHARYNGE  TESTING  Result Value Ref Range   SARS-CoV-2, NAA Detected (A) Not Detected    Comment: This nucleic acid amplification test was developed and its performance characteristics determined by [employer]. Nucleic acid amplification  tests include PCR and TMA. This test has not been FDA cleared or approved. This test has been authorized by FDA under an Emergency Use Authorization (EUA). This test is only authorized for the duration of time the declaration that  circumstances exist justifying the authorization of the emergency use of in vitro diagnostic tests for detection of SARS-CoV-2 virus and/or diagnosis of COVID-19 infection under section 564(b)(1) of the Act, 21 U.S.C. 254YHC-6(C) (1), unless the authorization is terminated or revoked sooner. When diagnostic testing is negative, the possibility of a false negative result should be considered in the context of a patient's recent exposures and the presence of clinical signs and symptoms consistent with COVID-19. An individual without symptoms of COVID-19 and who is not shedding SARS-CoV-2 virus would  expect to have a negative (not detected) result in this assay.   POCT INR     Status: None   Collection Time: 09/15/19  2:20 PM  Result Value Ref Range   INR 3.0 2.0 - 3.0  POCT INR     Status: Abnormal   Collection Time: 10/11/19 10:56 AM  Result Value Ref Range   INR 3.8 (A) 2.0 - 3.0  POCT INR     Status: Abnormal   Collection Time: 10/25/19 11:08 AM  Result Value Ref Range   INR 4.4 (A) 2.0 - 3.0  Basic metabolic panel     Status: Abnormal   Collection Time: 10/25/19 11:57 AM  Result Value Ref Range   Glucose 84 65 - 99 mg/dL   BUN 35 (H) 8 - 27 mg/dL   Creatinine, Ser 3.76 0.76 - 1.27 mg/dL   GFR calc non Af Amer 56 (L) >59 mL/min/1.73   GFR calc Af Amer 64 >59 mL/min/1.73   BUN/Creatinine Ratio 30 (H) 10 - 24   Sodium 139 134 - 144 mmol/L   Potassium 4.0 3.5 - 5.2 mmol/L   Chloride 92 (L) 96 - 106 mmol/L   CO2 27 20 - 29 mmol/L   Calcium 9.4 8.6 - 10.2 mg/dL    Assessment/Plan:  Hypertension blood pressure control important in reducing the progression of atherosclerotic disease. On appropriate oral medications.   Aortic stenosis A/p AVR almost twenty years ago. On anticoagulation.  Lower limb ulcer, ankle, left, limited to breakdown of skin (HCC) The patient has a nonhealing ulceration just above the left ankle.  This is been associated with 2 episodes of  cellulitis one being cultured with MRSA.  The wound is still quite slow to heal.  At this point, with the location of the wound venous insufficiency is highly likely.  He has significant venous stasis changes bilaterally with prominent varicosities actually a little worse on the right than the left.  I am also concerned about arterial perfusion particular given his age peripheral arterial disease could be playing a role in his poor wound healing.  This is a critical and potentially limb threatening situation.  We are going to obtain noninvasive studies of both the arterial and venous circuits in the near future at his convenience.  It may be possible that both circuits are problematic in which case arterial revascularization be performed followed by venous intervention.  We will see him back after the studies to discuss the results and determine further treatment options.      Festus Barren 11/02/2019, 3:40 PM   This note was created with Dragon medical transcription system.  Any errors from dictation are unintentional.

## 2019-11-02 NOTE — Assessment & Plan Note (Signed)
blood pressure control important in reducing the progression of atherosclerotic disease. On appropriate oral medications.  

## 2019-11-02 NOTE — Patient Instructions (Signed)
Peripheral Vascular Disease  Peripheral vascular disease (PVD) is a disease of the blood vessels that are not part of your heart and brain. A simple term for PVD is poor circulation. In most cases, PVD narrows the blood vessels that carry blood from your heart to the rest of your body. This can reduce the supply of blood to your arms, legs, and internal organs, like your stomach or kidneys. However, PVD most often affects a person's lower legs and feet. Without treatment, PVD tends to get worse. PVD can also lead to acute ischemic limb. This is when an arm or leg suddenly cannot get enough blood. This is a medical emergency. Follow these instructions at home: Lifestyle  Do not use any products that contain nicotine or tobacco, such as cigarettes and e-cigarettes. If you need help quitting, ask your doctor.  Lose weight if you are overweight. Or, stay at a healthy weight as told by your doctor.  Eat a diet that is low in fat and cholesterol. If you need help, ask your doctor.  Exercise regularly. Ask your doctor for activities that are right for you. General instructions  Take over-the-counter and prescription medicines only as told by your doctor.  Take good care of your feet: ? Wear comfortable shoes that fit well. ? Check your feet often for any cuts or sores.  Keep all follow-up visits as told by your doctor This is important. Contact a doctor if:  You have cramps in your legs when you walk.  You have leg pain when you are at rest.  You have coldness in a leg or foot.  Your skin changes.  You are unable to get or have an erection (erectile dysfunction).  You have cuts or sores on your feet that do not heal. Get help right away if:  Your arm or leg turns cold, numb, and blue.  Your arms or legs become red, warm, swollen, painful, or numb.  You have chest pain.  You have trouble breathing.  You suddenly have weakness in your face, arm, or leg.  You become very  confused or you cannot speak.  You suddenly have a very bad headache.  You suddenly cannot see. Summary  Peripheral vascular disease (PVD) is a disease of the blood vessels.  A simple term for PVD is poor circulation. Without treatment, PVD tends to get worse.  Treatment may include exercise, low fat and low cholesterol diet, and quitting smoking. This information is not intended to replace advice given to you by your health care provider. Make sure you discuss any questions you have with your health care provider. Document Released: 02/05/2010 Document Revised: 10/24/2017 Document Reviewed: 12/19/2016 Elsevier Patient Education  2020 Elsevier Inc.  

## 2019-11-02 NOTE — Assessment & Plan Note (Signed)
A/p AVR almost twenty years ago. On anticoagulation.

## 2019-11-02 NOTE — Assessment & Plan Note (Signed)
The patient has a nonhealing ulceration just above the left ankle.  This is been associated with 2 episodes of cellulitis one being cultured with MRSA.  The wound is still quite slow to heal.  At this point, with the location of the wound venous insufficiency is highly likely.  He has significant venous stasis changes bilaterally with prominent varicosities actually a little worse on the right than the left.  I am also concerned about arterial perfusion particular given his age peripheral arterial disease could be playing a role in his poor wound healing.  This is a critical and potentially limb threatening situation.  We are going to obtain noninvasive studies of both the arterial and venous circuits in the near future at his convenience.  It may be possible that both circuits are problematic in which case arterial revascularization be performed followed by venous intervention.  We will see him back after the studies to discuss the results and determine further treatment options.

## 2019-11-04 ENCOUNTER — Ambulatory Visit (INDEPENDENT_AMBULATORY_CARE_PROVIDER_SITE_OTHER): Payer: Medicare Other | Admitting: Nurse Practitioner

## 2019-11-04 ENCOUNTER — Encounter (INDEPENDENT_AMBULATORY_CARE_PROVIDER_SITE_OTHER): Payer: Self-pay | Admitting: Nurse Practitioner

## 2019-11-04 ENCOUNTER — Ambulatory Visit (INDEPENDENT_AMBULATORY_CARE_PROVIDER_SITE_OTHER): Payer: Medicare Other

## 2019-11-04 ENCOUNTER — Other Ambulatory Visit: Payer: Self-pay

## 2019-11-04 VITALS — BP 121/65 | HR 60 | Resp 16 | Wt 206.0 lb

## 2019-11-04 DIAGNOSIS — I10 Essential (primary) hypertension: Secondary | ICD-10-CM

## 2019-11-04 DIAGNOSIS — I83022 Varicose veins of left lower extremity with ulcer of calf: Secondary | ICD-10-CM | POA: Diagnosis not present

## 2019-11-04 DIAGNOSIS — L97321 Non-pressure chronic ulcer of left ankle limited to breakdown of skin: Secondary | ICD-10-CM

## 2019-11-04 DIAGNOSIS — I890 Lymphedema, not elsewhere classified: Secondary | ICD-10-CM

## 2019-11-04 DIAGNOSIS — L97222 Non-pressure chronic ulcer of left calf with fat layer exposed: Secondary | ICD-10-CM

## 2019-11-08 ENCOUNTER — Other Ambulatory Visit: Payer: Self-pay

## 2019-11-08 ENCOUNTER — Ambulatory Visit (INDEPENDENT_AMBULATORY_CARE_PROVIDER_SITE_OTHER): Payer: Medicare Other

## 2019-11-08 DIAGNOSIS — Z7901 Long term (current) use of anticoagulants: Secondary | ICD-10-CM | POA: Diagnosis not present

## 2019-11-08 DIAGNOSIS — Z952 Presence of prosthetic heart valve: Secondary | ICD-10-CM | POA: Diagnosis not present

## 2019-11-08 DIAGNOSIS — I350 Nonrheumatic aortic (valve) stenosis: Secondary | ICD-10-CM

## 2019-11-08 LAB — POCT INR: INR: 3.9 — AB (ref 2.0–3.0)

## 2019-11-08 NOTE — Patient Instructions (Signed)
Please take 1 tablet tonight, then continue dosage of 1 tablet every day except 1.5 tablets on Mondays, Wednesdays & Fridays.  Recheck INR in 3 weeks.

## 2019-11-09 ENCOUNTER — Encounter (INDEPENDENT_AMBULATORY_CARE_PROVIDER_SITE_OTHER): Payer: Self-pay | Admitting: Nurse Practitioner

## 2019-11-09 DIAGNOSIS — I83022 Varicose veins of left lower extremity with ulcer of calf: Secondary | ICD-10-CM | POA: Insufficient documentation

## 2019-11-09 NOTE — Progress Notes (Signed)
SUBJECTIVE:  Patient ID: [name], male    DOB: Sep 06, 1931, 85 y.o.   MRN: [id number] Chief Complaint  Patient presents with  . Follow-up    ultrasound follow up    HPI  [name] is a 85 y.o. male that returns today for evaluation of venous stasis ulceration.  The patient has been treated twice in the left lower extremity for cellulitis and it was also found to have MRSA.  At this time the patient has a mostly healed ulceration of the left medial portion of his ankle.  The wound has been some healing sores well.  There is no causative factors that started the symptoms.  The patient actually has prominent varicosities bilaterally.  He denies any previous vascular surgery.  The patient does endorse having some throbbing aching staying from his bilateral lower extremity varicose veins.  In some instances they do affect his ability to proceed with activities of daily living.  He denies any fever, chills, nausea, vomiting or diarrhea.  He denies any chest pain or shortness of breath.  Noninvasive studies today of the left lower extremity show reflux within the great saphenous vein.  The vein diameters measure from 0.31 cm to 0.50 cm.  There is no evidence of DVT or superficial venous thrombosis of the left lower extremity.  The patient's right ABI 0.98 with a left ABI 0.95.  He has biphasic/triphasic tibial arteries bilaterally.  He has strong toe waveforms bilaterally.  Past Medical History:  Diagnosis Date  . Anxiety 10/11  . Aortic stenosis    a. s/p mechcanical AVR, 2002; b. 10/2018 Echo: Triv AI, mean grad .  . Bradycardia    chronic, no symptoms 07/2010  . C. difficile colitis   . Carotid bruit    dopplers in past, no abnormalities  . Chronic diastolic CHF (congestive heart failure) (HCC)    a. Echo 03/2015: EF 60-65%, no RWMA, GR1DD, mild BAE, mild to mod MR, mod TR, PASP 65 mmHg; b. 01/2017 Echo: EF 55-60%, NRWMA, grade 1 diastolic dysfunction.  Normal  functioning prosthetic aortic valve.  Mean gradient 50 mmHg.  Sev TR. PASP ; c. 10/2018 Echo: EF 55-60%, Triv AI, mod dil LA, mod-sev TR, PASP 35-40, mild to mod red RV fxn.  . Coronary artery disease    a. mild, cath, 08/2010; b. medically managed  . Decreased hearing    Right ear  . Depression   . Gastric ulcer   . GERD (gastroesophageal reflux disease)   . Hypertension    BP higher than usual 04/19/10; amlodipine increased by telephone  . Mod-Sev Tricuspid regurgitation    a. 10/2018 Echo: Mod-Sev TR, PASP 35-9mmHg.  Marland Kitchen RLS (restless legs syndrome) 08/23/2015  . S/P AVR    a. [employer]. mechanical 2002; b. echo 08/2010 EF 60%, trival AI, mild MR, AVR working well; c. on longterm warfarin tx  . SOB (shortness of breath) 10/11   08/2010,Episodes at 5 AM, eventually felt to be anxiety, after complete workup including catheterization, pt greatly improved with anxiety meds 11/11    Past Surgical History:  Procedure Laterality Date  . CARDIAC CATHETERIZATION    . ESOPHAGOGASTRODUODENOSCOPY N/A 04/05/2015   Procedure: ESOPHAGOGASTRODUODENOSCOPY (EGD);  Surgeon: Scot Jun, MD;  Location: [facility] ENDOSCOPY;  Service: Endoscopy;  Laterality: N/A;  . ESOPHAGOGASTRODUODENOSCOPY N/A 04/17/2015   Procedure: ESOPHAGOGASTRODUODENOSCOPY (EGD);  Surgeon: Scot Jun, MD;  Location: [facility] ENDOSCOPY;  Service: Endoscopy;  Laterality: N/A;  . ESOPHAGOGASTRODUODENOSCOPY N/A 08/02/2015  Procedure: ESOPHAGOGASTRODUODENOSCOPY (EGD);  Surgeon: [name], MD;  [facility];  Service: Endoscopy;  Laterality: N/A;  . HERNIA REPAIR    . JOINT REPLACEMENT    . TOTAL HIP ARTHROPLASTY    . VALVE REPLACEMENT  1/02   Aortic; echo 3/09 valve working well; echo 10/11 working well; put on Coumadin    Social History   Socioeconomic History  . Marital status: Married    Spouse name: Not on file  . Number of children: 3  . Years of education: some coll.  . Highest education level: Not on file   Occupational History  . Occupation: Retired    Associate Professormployer: RETIRED  Tobacco Use  . Smoking status: Former Smoker    Types: Cigarettes    Quit date: 1979    Years since quitting: 41.9  . Smokeless tobacco: Never Used  Substance and Sexual Activity  . Alcohol use: Yes    Alcohol/week: 3.0 standard drinks    Types: 3 Glasses of wine per week    Comment: Social  . Drug use: No  . Sexual activity: Never  Other Topics Concern  . Not on file  Social History Narrative   No regular exercise.   [facility]. His wife lives in same facility but she lives in the assisted living part.      Patient drinks 1-2 cups of caffeine daily.   Patient is right handed.   Social Determinants of Health   Financial Resource Strain:   . Difficulty of Paying Living Expenses: Not on file  Food Insecurity:   . Worried About Programme researcher, broadcasting/film/videounning Out of Food in the Last Year: Not on file  . Ran Out of Food in the Last Year: Not on file  Transportation Needs:   . Lack of Transportation (Medical): Not on file  . Lack of Transportation (Non-Medical): Not on file  Physical Activity:   . Days of Exercise per Week: Not on file  . Minutes of Exercise per Session: Not on file  Stress:   . Feeling of Stress : Not on file  Social Connections:   . Frequency of Communication with Friends and Family: Not on file  . Frequency of Social Gatherings with Friends and Family: Not on file  . Attends Religious Services: Not on file  . Active Member of Clubs or Organizations: Not on file  . Attends BankerClub or Organization Meetings: Not on file  . Marital Status: Not on file  Intimate Partner Violence:   . Fear of Current or Ex-Partner: Not on file  . Emotionally Abused: Not on file  . Physically Abused: Not on file  . Sexually Abused: Not on file    Family History  Problem Relation Age of Onset  . Hypertension Mother   . Diabetes type II Mother   . Heart disease Mother   . Heart attack Mother   . Hypertension  Father   . Heart disease Father   . Stroke Father   . Stroke Brother   . Stroke Brother   . Hypertension Other     Allergies  Allergen Reactions  . Levofloxacin Nausea Only  . Sulfa Antibiotics Other (See Comments)    Reaction:  Unknown      Review of Systems   Review of Systems: Negative Unless Checked Constitutional: [] Weight loss  [] Fever  [] Chills Cardiac: [] Chest pain   []  Atrial Fibrillation  [] Palpitations   [] Shortness of breath when laying flat   [x] Shortness of breath with exertion. [] Shortness  of breath at rest Vascular:  Pain in legs with walking   Pain in legs with standing Pain in legs when laying flat   Claudication    Pain in feet when laying flat    History of DVT   Phlebitis   Swelling in legs   Varicose veins   Non-healing ulcers Pulmonary:   Uses home oxygen   Productive cough   Hemoptysis   Wheeze  COPD   Asthma Neurologic:  Dizziness   Seizures  Blackouts History of stroke   History of TIA  Aphasia   Temporary Blindness   Weakness or numbness in arm   Weakness or numbness in leg Musculoskeletal:   Joint swelling   Joint pain   Low back pain   History of Knee Replacement Arthritis back Surgeries   Spinal Stenosis    Hematologic:  Easy bruising  Easy bleeding   Hypercoagulable state   Anemic Gastrointestinal:  Diarrhea   Vomiting  Gastroesophageal reflux/heartburn   Difficulty swallowing. Abdominal pain Genitourinary:  Chronic kidney disease   Difficult urination  Anuric   Blood in urine Frequent urination  Burning with urination   Hematuria Skin:  Rashes   Ulcers Wounds Psychological:  History of anxiety    History of major depression   Memory Difficulties       OBJECTIVE:   Physical Exam  BP 121/65 (BP Location: Right Arm)   Pulse 60   Resp 16   Wt 206 lb (93.4 kg)   BMI 28.73 kg/m   Gen: WD/WN, NAD Head: [place]/AT, No temporalis wasting.    Ear/Nose/Throat: Hearing grossly intact, nares w/o erythema or drainage Eyes: PER, EOMI, sclera nonicteric.  Neck: Supple, no masses.  No JVD.  Pulmonary:  Good air movement, no use of accessory muscles.  Cardiac: RRR Vascular:  Large varicosity 4 mm, down left lower extremity. Vessel Right Left  Radial Palpable Palpable  Dorsalis Pedis Palpable Palpable  Posterior Tibial Palpable Palpable   Gastrointestinal: soft, non-distended. No guarding/no peritoneal signs.  Musculoskeletal: M/S 5/5 throughout.  No deformity or atrophy.  Neurologic: Pain and light touch intact in extremities.  Symmetrical.  Speech is fluent. Motor exam as listed above. Psychiatric: Judgment intact, Mood & affect appropriate for pt's clinical situation. Dermatologic:  Bilateral stasis dermatitis with quarter sized ulcer at left medial ankle, that has scabbed over.  No changes consistent with cellulitis. Lymph : No Cervical lymphadenopathy, no lichenification or skin changes of chronic lymphedema.       ASSESSMENT AND PLAN:  1. Lower limb ulcer, ankle, left, limited to breakdown of skin (HCC) Lower limb ulcer has developed a scab over at this time.  The patient and daughter both state that he is doing considerably well.  We will continue with conservative treatment therapies as outlined below and we will have the patient return to the office to determine possible further steps such as lymphedema pump and/or ablation of the great saphenous vein in 3 months.  Patient and daughter are instructed to contact her office sooner if the wound should reopen/recur.  Noninvasive studies do not seem to suggest arterial component worsening the patient's wound.  2. 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  I have had a long discussion with the patient regarding  varicose veins  and why they cause symptoms.  Patient will begin wearing graduated compression stockings  class 1 on a daily basis, beginning first thing in the morning and removing them in the evening. The patient is instructed specifically not to sleep in the stockings.    The patient  will also begin using over-the-counter analgesics such as Motrin 600 mg po TID to help control the symptoms.    In addition, behavioral modification including elevation during the day will be initiated.    Pending the results of these changes the  patient will be reevaluated in three months.   An  ultrasound of the venous system will be obtained.   Further plans will be based on the ultrasound results and whether conservative therapies are successful at eliminating the pain and swelling.   3. Lymphedema I have had a long discussion with the patient regarding swelling and why it  causes symptoms.  Patient will begin wearing graduated compression stockings class 1 (20-30 mmHg) on a daily basis a prescription was given. The patient will  beginning wearing the stockings first thing in the morning and removing them in the evening. The patient is instructed specifically not to sleep in the stockings.   In addition, behavioral modification will be initiated.  This will include frequent elevation, use of over the counter pain medications and exercise such as walking.  I have reviewed systemic causes for chronic edema such as liver, kidney and cardiac etiologies.  The patient denies problems with these organ systems.    Consideration for a lymph pump will also be made based upon the effectiveness of conservative therapy.  This would help to improve the edema control and prevent sequela such as ulcers and infections   The patient will follow-up with me after the ultrasound.    4. Essential hypertension Continue antihypertensive medications as already ordered, these medications have been reviewed and there are no changes at this  time.    Current Outpatient Medications on File Prior to Visit  Medication Sig Dispense Refill  . baclofen (LIORESAL) 10 MG tablet Take 10 mg by mouth 3 (three) times daily.    . Cetirizine HCl 10 MG CAPS Take 10 mg by mouth daily.    . citalopram (CELEXA) 20 MG tablet Take 20 mg by mouth daily.    . cloNIDine (CATAPRES) 0.1 MG tablet TAKE 1 TABLET BY MOUTH TWO  TIMES DAILY 180 tablet 2  . doxazosin (CARDURA) 8 MG tablet TAKE 1 TABLET BY MOUTH  TWICE A DAY 180 tablet 3  . finasteride (PROSCAR) 5 MG tablet Take 5 mg by mouth daily.      . isosorbide mononitrate (IMDUR) 60 MG 24 hr tablet Take 1 tablet (60 mg total) by mouth 2 (two) times daily. 28 tablet 0  . metolazone (ZAROXOLYN) 2.5 MG tablet Take 1 tablet (2.5MG ) by mouth THREE times a WEEK 90 tablet 0  . metoprolol succinate (TOPROL-XL) 25 MG 24 hr tablet Take 0.5 tablets (12.5 mg total) by mouth daily. 90 tablet 3  . potassium chloride (K-DUR) 10 MEQ tablet Take 3 tablets (30 mEq total) by mouth daily. Take an additional only on the days you take metolazone. 180 tablet 3  . ranitidine (ZANTAC) 150 MG tablet Take 150 mg by mouth as needed.    Marland Kitchen rOPINIRole (REQUIP) 2 MG tablet TAKE 1 TABLET BY MOUTH 4  TIMES DAILY 360 tablet 3  . Tamsulosin HCl (FLOMAX) 0.4 MG CAPS Take 0.4 mg by mouth at bedtime.     . torsemide (DEMADEX) 20 MG tablet Take TWO tablets by mouth TWICE  a day. 270 tablet 3  . traMADol (ULTRAM) 50 MG tablet Take 50 mg by mouth 2 (two) times daily as needed.    . traZODone (DESYREL) 50 MG tablet Take 50 mg by mouth at bedtime.    Marland Kitchen warfarin (COUMADIN) 5 MG tablet Take 1 tablet daily except 1 and 1/2 tabs on Monday, Wednesday, and Friday or as directed by [facility]. 120 tablet 1  . cephALEXin (KEFLEX) 500 MG capsule Take 500 mg by mouth 3 (three) times daily.    Marland Kitchen enoxaparin (LOVENOX) 100 MG/ML injection Inject 1 mL (100 mg total) into the skin every 12 (twelve) hours. (Patient not taking: Reported on 11/02/2019) 20  Syringe 1   No current facility-administered medications on file prior to visit.    There are no Patient Instructions on file for this visit. No follow-ups on file.   Kris Hartmann, NP  This note was completed with Sales executive.  Any errors are purely unintentional.

## 2019-11-13 ENCOUNTER — Other Ambulatory Visit: Payer: Self-pay | Admitting: Cardiovascular Disease

## 2019-11-15 NOTE — Telephone Encounter (Signed)
Refill request

## 2019-11-22 ENCOUNTER — Other Ambulatory Visit: Payer: Self-pay | Admitting: Cardiovascular Disease

## 2019-11-22 MED ORDER — ISOSORBIDE MONONITRATE ER 60 MG PO TB24: 60.0000 mg | ORAL_TABLET | Freq: Two times a day (BID) | ORAL | 0 refills | Status: DC

## 2019-11-22 NOTE — Telephone Encounter (Signed)
*  STAT* If patient is at the pharmacy, call can be transferred to refill team.   1. Which medications need to be refilled? (please list name of each medication and dose if known) Isosorbide 60 mg bid  2. Which pharmacy/location (including street and city if local pharmacy) is medication to be sent to? [facility] in [address]  3. Do they need a 30 day or 90 day supply? [place]

## 2019-11-22 NOTE — Telephone Encounter (Signed)
Requested Prescriptions   Signed Prescriptions Disp Refills  . isosorbide mononitrate (IMDUR) 60 MG 24 hr tablet 60 tablet 0    Sig: Take 1 tablet (60 mg total) by mouth 2 (two) times daily.    Authorizing Provider: Minna Merritts    Ordering User: Britt Bottom

## 2019-11-29 ENCOUNTER — Ambulatory Visit (INDEPENDENT_AMBULATORY_CARE_PROVIDER_SITE_OTHER): Payer: Medicare Other

## 2019-11-29 ENCOUNTER — Other Ambulatory Visit: Payer: Self-pay

## 2019-11-29 DIAGNOSIS — Z952 Presence of prosthetic heart valve: Secondary | ICD-10-CM | POA: Diagnosis not present

## 2019-11-29 DIAGNOSIS — Z7901 Long term (current) use of anticoagulants: Secondary | ICD-10-CM

## 2019-11-29 DIAGNOSIS — I350 Nonrheumatic aortic (valve) stenosis: Secondary | ICD-10-CM | POA: Diagnosis not present

## 2019-11-29 LAB — POCT INR: INR: 3.8 — AB (ref 2.0–3.0)

## 2019-11-29 NOTE — Patient Instructions (Signed)
Please take 1/2 tablet tonight, then continue dosage of 1 tablet every day except 1.5 tablets on Mondays, Wednesdays & Fridays.  Recheck INR in 3 weeks.

## 2019-12-14 ENCOUNTER — Telehealth (INDEPENDENT_AMBULATORY_CARE_PROVIDER_SITE_OTHER): Payer: Self-pay | Admitting: Vascular Surgery

## 2019-12-14 NOTE — Telephone Encounter (Signed)
They can see JD or me...no studies needed

## 2019-12-14 NOTE — Telephone Encounter (Signed)
Per Vivia [place]. See notes below

## 2019-12-14 NOTE — Telephone Encounter (Signed)
Last seen 11/04/2019. Please advise.

## 2019-12-15 ENCOUNTER — Encounter (INDEPENDENT_AMBULATORY_CARE_PROVIDER_SITE_OTHER): Payer: Self-pay | Admitting: Nurse Practitioner

## 2019-12-15 ENCOUNTER — Other Ambulatory Visit: Payer: Self-pay

## 2019-12-15 ENCOUNTER — Ambulatory Visit (INDEPENDENT_AMBULATORY_CARE_PROVIDER_SITE_OTHER): Payer: Medicare Other | Admitting: Nurse Practitioner

## 2019-12-15 VITALS — BP 126/72 | HR 80 | Ht 71.0 in | Wt 203.0 lb

## 2019-12-15 DIAGNOSIS — I10 Essential (primary) hypertension: Secondary | ICD-10-CM

## 2019-12-15 DIAGNOSIS — I83022 Varicose veins of left lower extremity with ulcer of calf: Secondary | ICD-10-CM | POA: Diagnosis not present

## 2019-12-15 DIAGNOSIS — L97222 Non-pressure chronic ulcer of left calf with fat layer exposed: Secondary | ICD-10-CM | POA: Diagnosis not present

## 2019-12-15 DIAGNOSIS — I890 Lymphedema, not elsewhere classified: Secondary | ICD-10-CM | POA: Diagnosis not present

## 2019-12-15 MED ORDER — DOXYCYCLINE HYCLATE 100 MG PO CAPS: 100.0000 mg | ORAL_CAPSULE | Freq: Two times a day (BID) | ORAL | 0 refills | Status: DC

## 2019-12-17 ENCOUNTER — Encounter: Payer: Self-pay | Admitting: Cardiovascular Disease

## 2019-12-20 ENCOUNTER — Ambulatory Visit (INDEPENDENT_AMBULATORY_CARE_PROVIDER_SITE_OTHER): Payer: Medicare Other

## 2019-12-20 ENCOUNTER — Encounter (INDEPENDENT_AMBULATORY_CARE_PROVIDER_SITE_OTHER): Payer: Self-pay | Admitting: Nurse Practitioner

## 2019-12-20 ENCOUNTER — Other Ambulatory Visit: Payer: Self-pay

## 2019-12-20 DIAGNOSIS — I350 Nonrheumatic aortic (valve) stenosis: Secondary | ICD-10-CM

## 2019-12-20 DIAGNOSIS — Z7901 Long term (current) use of anticoagulants: Secondary | ICD-10-CM | POA: Diagnosis not present

## 2019-12-20 DIAGNOSIS — Z952 Presence of prosthetic heart valve: Secondary | ICD-10-CM

## 2019-12-20 LAB — POCT INR: INR: 4 — AB (ref 2.0–3.0)

## 2019-12-20 NOTE — Patient Instructions (Signed)
Please skip warfarin tonight, then continue dosage of 1 tablet every day except 1.5 tablets on Mondays, Wednesdays & Fridays.  Recheck INR in 3 weeks.

## 2019-12-20 NOTE — Progress Notes (Signed)
SUBJECTIVE:  Patient ID: [name], male    DOB: [birth year], 86 y.o.   MRN: [id number] Chief Complaint  Patient presents with  . Follow-up    LLE Ulcers, Discoloration and soreness    HPI  [name] is a 86 y.o. male Patient is seen for evaluation of leg pain and swelling associated with new onset ulceration. The patient first noticed the swelling remotely. The swelling is associated with pain and discoloration. The pain and swelling worsens with prolonged dependency and improves with elevation. The pain is unrelated to activity.  The patient notes that in the morning the legs are better but the leg symptoms worsened throughout the course of the day. The patient has also noted a progressive worsening of the discoloration in the ankle and shin area.   The patient notes that an ulcer has developed acutely without specific trauma and since it occurred it has been very slow to heal.  There is a moderate amount of drainage associated with the open area.  The wound is also very painful.  The patient denies claudication symptoms or rest pain symptoms.  The patient denies DJD and LS spine disease.  The patient has not had any past angiography, interventions or vascular surgery.  Elevation makes the leg symptoms better, dependency makes them much worse. The patient denies any recent changes in medications.  The patient has been wearing graduated compression.  The patient denies a history of DVT or PE. There is no prior history of phlebitis.      Past Medical History:  Diagnosis Date  . Anxiety 10/11  . Aortic stenosis    a. s/p mechcanical AVR, 2002; b. 10/2018 Echo: Triv AI, mean grad 63mmHg.  . Bradycardia    chronic, no symptoms 07/2010  . C. difficile colitis   . Carotid bruit    dopplers in past, no abnormalities  . Chronic diastolic CHF (congestive heart failure) ([place])    a. Echo 03/2015: EF 60-65%, no RWMA, GR1DD, mild BAE, mild to mod MR, mod TR, PASP 65  mmHg; b. 01/2017 Echo: EF 55-60%, NRWMA, grade 1 diastolic dysfunction.  Normal functioning prosthetic aortic valve.  Mean gradient 50 mmHg.  Sev TR. PASP 53mmHg; c. 10/2018 Echo: EF 55-60%, Triv AI, mod dil LA, mod-sev TR, PASP 35-40, mild to mod red RV fxn.  . Coronary artery disease    a. mild, cath, 08/2010; b. medically managed  . Decreased hearing    Right ear  . Depression   . Gastric ulcer   . GERD (gastroesophageal reflux disease)   . Hypertension    BP higher than usual 04/19/10; amlodipine increased by telephone  . Mod-Sev Tricuspid regurgitation    a. 10/2018 Echo: Mod-Sev TR, PASP 35-65mmHg.  Marland Kitchen RLS (restless legs syndrome) 08/23/2015  . S/P AVR    a. [employer]. mechanical 2002; b. echo 08/2010 EF 60%, trival AI, mild MR, AVR working well; c. on longterm warfarin tx  . SOB (shortness of breath) 10/11   08/2010,Episodes at 5 AM, eventually felt to be anxiety, after complete workup including catheterization, pt greatly improved with anxiety meds 11/11    Past Surgical History:  Procedure Laterality Date  . CARDIAC CATHETERIZATION    . ESOPHAGOGASTRODUODENOSCOPY N/A 04/05/2015   Procedure: ESOPHAGOGASTRODUODENOSCOPY (EGD);  Surgeon: Manya Silvas, MD;  [facility] ENDOSCOPY;  Service: Endoscopy;  Laterality: N/A;  . ESOPHAGOGASTRODUODENOSCOPY N/A 04/17/2015   Procedure: ESOPHAGOGASTRODUODENOSCOPY (EGD);  Surgeon: Manya Silvas, MD;  Location: [facility];  Service: Endoscopy;  Laterality: N/A;  . ESOPHAGOGASTRODUODENOSCOPY N/A 08/02/2015   Procedure: ESOPHAGOGASTRODUODENOSCOPY (EGD);  Surgeon: Wallace Cullens, MD;  Location: [facility] ENDOSCOPY;  Service: Endoscopy;  Laterality: N/A;  . HERNIA REPAIR    . JOINT REPLACEMENT    . TOTAL HIP ARTHROPLASTY    . VALVE REPLACEMENT  1/02   Aortic; echo 3/09 valve working well; echo 10/11 working well; put on Coumadin    Social History   Socioeconomic History  . Marital status: Married    Spouse name: Not on file  . Number of  children: 3  . Years of education: some coll.  . Highest education level: Not on file  Occupational History  . Occupation: Retired    Associate Professor: RETIRED  Tobacco Use  . Smoking status: Former Smoker    Types: Cigarettes    Quit date: 1979    Years since quitting: 42.0  . Smokeless tobacco: Never Used  Substance and Sexual Activity  . Alcohol use: Yes    Alcohol/week: 3.0 standard drinks    Types: 3 Glasses of wine per week    Comment: Social  . Drug use: No  . Sexual activity: Never  Other Topics Concern  . Not on file  Social History Narrative   No regular exercise.   Patient lives at [facility]. His wife lives in same facility but she lives in the assisted living part.      Patient drinks 1-2 cups of caffeine daily.   Patient is right handed.   Social Determinants of Health   Financial Resource Strain:   . Difficulty of Paying Living Expenses: Not on file  Food Insecurity:   . Worried About Programme researcher, broadcasting/film/video in the Last Year: Not on file  . Ran Out of Food in the Last Year: Not on file  Transportation Needs:   . Lack of Transportation (Medical): Not on file  . Lack of Transportation (Non-Medical): Not on file  Physical Activity:   . Days of Exercise per Week: Not on file  . Minutes of Exercise per Session: Not on file  Stress:   . Feeling of Stress : Not on file  Social Connections:   . Frequency of Communication with Friends and Family: Not on file  . Frequency of Social Gatherings with Friends and Family: Not on file  . Attends Religious Services: Not on file  . Active Member of Clubs or Organizations: Not on file  . Attends Banker Meetings: Not on file  . Marital Status: Not on file  Intimate Partner Violence:   . Fear of Current or Ex-Partner: Not on file  . Emotionally Abused: Not on file  . Physically Abused: Not on file  . Sexually Abused: Not on file    Family History  Problem Relation Age of Onset  . Hypertension Mother   .  Diabetes type II Mother   . Heart disease Mother   . Heart attack Mother   . Hypertension Father   . Heart disease Father   . Stroke Father   . Stroke Brother   . Stroke Brother   . Hypertension Other     Allergies  Allergen Reactions  . Levofloxacin Nausea Only  . Sulfa Antibiotics Other (See Comments)    Reaction:  Unknown      Review of Systems   Review of Systems: Negative Unless Checked Constitutional: [] Weight loss  [] Fever  [] Chills Cardiac: [] Chest pain   []  Atrial Fibrillation  [] Palpitations   [] Shortness of  breath when laying flat   [] Shortness of breath with exertion. [] Shortness of breath at rest Vascular:  [] Pain in legs with walking   [] Pain in legs with standing [] Pain in legs when laying flat   [] Claudication    [] Pain in feet when laying flat    [] History of DVT   [] Phlebitis   [x] Swelling in legs   [x] Varicose veins   [x] Non-healing ulcers Pulmonary:   [] Uses home oxygen   [] Productive cough   [] Hemoptysis   [] Wheeze  [] COPD   [] Asthma Neurologic:  [] Dizziness   [] Seizures  [] Blackouts [] History of stroke   [] History of TIA  [] Aphasia   [] Temporary Blindness   [] Weakness or numbness in arm   [] Weakness or numbness in leg Musculoskeletal:   [] Joint swelling   [] Joint pain   [] Low back pain  []  History of Knee Replacement [] Arthritis [] back Surgeries  []  Spinal Stenosis    Hematologic:  [] Easy bruising  [] Easy bleeding   [] Hypercoagulable state   [x] Anemic Gastrointestinal:  [] Diarrhea   [] Vomiting  [x] Gastroesophageal reflux/heartburn   [] Difficulty swallowing. [] Abdominal pain Genitourinary:  [] Chronic kidney disease   [] Difficult urination  [] Anuric   [] Blood in urine [] Frequent urination  [] Burning with urination   [] Hematuria Skin:  [] Rashes   [] Ulcers [] Wounds Psychological:  [x] History of anxiety   [x]  History of major depression  []  Memory Difficulties      OBJECTIVE:   Physical Exam  BP 126/72 (BP Location: Right Arm)   Pulse 80   Ht 5\' 11"  (1.803 m)    Wt 203 lb (92.1 kg)   BMI 28.31 kg/m   Gen: WD/WN, NAD Head: [place]/AT, No temporalis wasting.  Ear/Nose/Throat: Hearing grossly intact, nares w/o erythema or drainage Eyes: PER, EOMI, sclera nonicteric.  Neck: Supple, no masses.  No JVD.  Pulmonary:  Good air movement, no use of accessory muscles.  Cardiac: RRR Vascular: small ulceration, left calf, moderate drainage.  Vessel Right Left  Dorsalis Pedis Palpable Palpable  Posterior Tibial Palpable Palpable   Gastrointestinal: soft, non-distended. No guarding/no peritoneal signs.  Musculoskeletal: M/S 5/5 throughout.  No deformity or atrophy.  Neurologic: Pain and light touch intact in extremities.  Symmetrical.  Speech is fluent. Motor exam as listed above. Psychiatric: Judgment intact, Mood & affect appropriate for pt's clinical situation. Dermatologic: Mild stasis dermatitis  No changes consistent with cellulitis. Lymph : No Cervical lymphadenopathy, dermal thickening      ASSESSMENT AND PLAN:  1. 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    I have had a long discussion with the patient regarding venous insufficiency and why it  causes symptoms, specifically venous ulceration . I have discussed with the patient the chronic skin changes that accompany venous insufficiency and the long term sequela such as infection and recurring  ulceration.  Patient will be placed in which will be changed weekly drainage permitting.  In addition, behavioral modification including several periods of elevation of the lower extremities during the day will be continued. Achieving a position with the ankles at heart level was stressed to the patient  The patient is instructed to begin routine exercise, especially walking on a daily basis  Patient will be reassessed in four weeks to evaluate progression with wound healing.   2. Essential  hypertension Continue antihypertensive medications as already ordered, these medications have been reviewed and there are no changes at this time.  3. Lymphedema Patient will continue to utilize medical grade 1 compression stockings on his right lower extremity.  Otherwise he will continue with conservative therapy such as elevation of his lower extremities and exercise when possible.   Current Outpatient Medications on File Prior to Visit  Medication Sig Dispense Refill  . cephALEXin (KEFLEX) 500 MG capsule Take 500 mg by mouth 3 (three) times daily.    . Cetirizine HCl 10 MG CAPS Take 10 mg by mouth daily.    . citalopram (CELEXA) 20 MG tablet Take 20 mg by mouth daily.    . cloNIDine (CATAPRES) 0.1 MG tablet TAKE 1 TABLET BY MOUTH TWO  TIMES DAILY 180 tablet 2  . doxazosin (CARDURA) 8 MG tablet TAKE 1 TABLET BY MOUTH  TWICE A DAY 180 tablet 3  . enoxaparin (LOVENOX) 100 MG/ML injection Inject 1 mL (100 mg total) into the skin every 12 (twelve) hours. 20 Syringe 1  . finasteride (PROSCAR) 5 MG tablet Take 5 mg by mouth daily.      . isosorbide mononitrate (IMDUR) 60 MG 24 hr tablet Take 1 tablet (60 mg total) by mouth 2 (two) times daily. 60 tablet 0  . metolazone (ZAROXOLYN) 2.5 MG tablet Take 1 tablet (2.5MG ) by mouth THREE times a WEEK 90 tablet 0  . metoprolol succinate (TOPROL-XL) 25 MG 24 hr tablet Take 0.5 tablets (12.5 mg total) by mouth daily. 90 tablet 3  . ranitidine (ZANTAC) 150 MG tablet Take 150 mg by mouth as needed.    Marland Kitchen rOPINIRole (REQUIP) 2 MG tablet TAKE 1 TABLET BY MOUTH 4  TIMES DAILY 360 tablet 3  . Tamsulosin HCl (FLOMAX) 0.4 MG CAPS Take 0.4 mg by mouth at bedtime.     . torsemide (DEMADEX) 20 MG tablet Take TWO tablets by mouth TWICE a day. 270 tablet 3  . traMADol (ULTRAM) 50 MG tablet Take 50 mg by mouth 2 (two) times daily as needed.    . traZODone (DESYREL) 50 MG tablet Take 50 mg by mouth at bedtime.    Marland Kitchen warfarin (COUMADIN) 5 MG tablet TAKE 1 TABLET DAILY  EXCEPT  1 AND 1/2 TABS ON MONDAY,  WEDNESDAY, AND FRIDAY OR AS DIRECTED BY COUMADIN  CLINIC. 30 tablet 1  . baclofen (LIORESAL) 10 MG tablet Take 10 mg by mouth 3 (three) times daily.    . potassium chloride (K-DUR) 10 MEQ tablet Take 3 tablets (30 mEq total) by mouth daily. Take an additional only on the days you take metolazone. 180 tablet 3   No current facility-administered medications on file prior to visit.    There are no Patient Instructions on file for this visit. No follow-ups on file.   Georgiana Spinner, NP  This note was completed with Office manager.  Any errors are purely unintentional.

## 2019-12-22 ENCOUNTER — Encounter (INDEPENDENT_AMBULATORY_CARE_PROVIDER_SITE_OTHER): Payer: Self-pay

## 2019-12-22 ENCOUNTER — Other Ambulatory Visit: Payer: Self-pay

## 2019-12-22 ENCOUNTER — Ambulatory Visit (INDEPENDENT_AMBULATORY_CARE_PROVIDER_SITE_OTHER): Payer: Medicare Other | Admitting: Nurse Practitioner

## 2019-12-22 VITALS — BP 117/65 | HR 82 | Resp 16 | Wt 205.0 lb

## 2019-12-22 DIAGNOSIS — L97321 Non-pressure chronic ulcer of left ankle limited to breakdown of skin: Secondary | ICD-10-CM | POA: Diagnosis not present

## 2019-12-22 NOTE — Progress Notes (Signed)
History of Present Illness  There is no documented history at this time  Assessments & Plan   There are no diagnoses linked to this encounter.    Additional instructions  Subjective:  Patient presents with venous ulcer of the Left lower extremity.    Procedure:  3 layer unna wrap was placed Left lower extremity.   Plan:   Follow up in one week.  

## 2019-12-24 ENCOUNTER — Encounter (INDEPENDENT_AMBULATORY_CARE_PROVIDER_SITE_OTHER): Payer: Self-pay | Admitting: Nurse Practitioner

## 2019-12-29 ENCOUNTER — Ambulatory Visit (INDEPENDENT_AMBULATORY_CARE_PROVIDER_SITE_OTHER): Payer: Medicare Other | Admitting: Nurse Practitioner

## 2019-12-29 ENCOUNTER — Encounter: Payer: Self-pay | Admitting: Nurse Practitioner

## 2019-12-29 ENCOUNTER — Encounter (INDEPENDENT_AMBULATORY_CARE_PROVIDER_SITE_OTHER): Payer: Self-pay

## 2019-12-29 ENCOUNTER — Ambulatory Visit: Payer: Medicare Other | Admitting: Cardiovascular Disease

## 2019-12-29 ENCOUNTER — Other Ambulatory Visit: Payer: Self-pay

## 2019-12-29 VITALS — BP 127/71 | HR 59 | Ht 71.0 in | Wt 208.2 lb

## 2019-12-29 VITALS — BP 120/63 | HR 67 | Resp 16 | Wt 207.0 lb

## 2019-12-29 DIAGNOSIS — I5033 Acute on chronic diastolic (congestive) heart failure: Secondary | ICD-10-CM | POA: Diagnosis not present

## 2019-12-29 DIAGNOSIS — I2510 Atherosclerotic heart disease of native coronary artery without angina pectoris: Secondary | ICD-10-CM

## 2019-12-29 DIAGNOSIS — Z952 Presence of prosthetic heart valve: Secondary | ICD-10-CM

## 2019-12-29 DIAGNOSIS — I10 Essential (primary) hypertension: Secondary | ICD-10-CM | POA: Diagnosis not present

## 2019-12-29 DIAGNOSIS — I890 Lymphedema, not elsewhere classified: Secondary | ICD-10-CM

## 2019-12-29 MED ORDER — METOLAZONE 2.5 MG PO TABS: 2.5000 mg | ORAL_TABLET | ORAL | 1 refills | Status: DC

## 2019-12-29 NOTE — Patient Instructions (Signed)
Medication Instructions:  Your physician has recommended you make the following change in your medication:  DECREASE Metolazone to 2.5 mg twice a week. An Rx has been sent to [facility].  *If you need a refill on your cardiac medications before your next appointment, please call your pharmacy*  Lab Work: Your physician recommends that you return for lab work on Monday  01/10/20. ([facility]) to be drawn at the medical mall their hours are Mon-Fri 7am-6pm.  If you have labs (blood work) drawn today and your tests are completely normal, you will receive your results only by: Marland Kitchen MyChart Message (if you have MyChart) OR . A paper copy in the mail If you have any lab test that is abnormal or we need to change your treatment, we will call you to review the results.  Testing/Procedures: None ordered  Follow-Up: At [facility], you and your health needs are our priority.  As part of our continuing mission to provide you with exceptional heart care, we have created designated Provider Care Teams.  These Care Teams include your primary Cardiologist (physician) and Advanced Practice Providers (APPs -  Physician Assistants and Nurse Practitioners) who all work together to provide you with the care you need, when you need it.  Your next appointment:   6 week(s)  The format for your next appointment:   In Person  Provider:    You may see Julien Nordmann, MD or one of the following Advanced Practice Providers on your designated Care Team:    Nicolasa Ducking, NP   Other Instructions N/A

## 2019-12-29 NOTE — Progress Notes (Signed)
History of Present Illness  There is no documented history at this time  Assessments & Plan   There are no diagnoses linked to this encounter.    Additional instructions  Subjective:  Patient presents with venous ulcer of the Left lower extremity.    Procedure:  3 layer unna wrap was placed Left lower extremity.   Plan:   Follow up in one week.  

## 2019-12-29 NOTE — Progress Notes (Signed)
Office Visit    Patient Name: [name] Date of Encounter: 12/29/2019  Primary Care Provider:  Gracelyn Nurse, MD Primary Cardiologist:  Julien Nordmann, MD  Chief Complaint    86 year old male with a history of permanent atrial fibrillation, aortic stenosis status post mechanical aortic valve replacement on chronic Coumadin, chronic diastolic congestive heart failure, nonobstructive CAD, hypertension, hyperlipidemia, GERD, chronic dyspnea, and chronic venous stasis, who presents for follow-up related to heart failure and volume excess.  Past Medical History    Past Medical History:  Diagnosis Date  . Anxiety 10/11  . Aortic stenosis    a. s/p mechcanical AVR, 2002; b. 10/2018 Echo: Triv AI, mean grad .  . Bradycardia    chronic, no symptoms 07/2010  . C. difficile colitis   . Carotid bruit    dopplers in past, no abnormalities  . Chronic diastolic CHF (congestive heart failure) (HCC)    a. Echo 03/2015: EF 60-65%, no RWMA, GR1DD, mild BAE, mild to mod MR, mod TR, PASP 65 mmHg; b. 01/2017 Echo: EF 55-60%, NRWMA, grade 1 diastolic dysfunction.  Normal functioning prosthetic aortic valve.  Mean gradient 50 mmHg.  Sev TR. PASP ; c. 10/2018 Echo: EF 55-60%, Triv AI, mod dil LA, mod-sev TR, PASP 35-40, mild to mod red RV fxn.  . Coronary artery disease    a. mild, cath, 08/2010; b. medically managed  . Decreased hearing    Right ear  . Depression   . Gastric ulcer   . GERD (gastroesophageal reflux disease)   . Hypertension    BP higher than usual 04/19/10; amlodipine increased by telephone  . Mod-Sev Tricuspid regurgitation    a. 10/2018 Echo: Mod-Sev TR, PASP 35-28mmHg.  Marland Kitchen RLS (restless legs syndrome) 08/23/2015  . S/P AVR    a. [employer]. mechanical 2002; b. echo 08/2010 EF 60%, trival AI, mild MR, AVR working well; c. on longterm warfarin tx  . SOB (shortness of breath) 10/11   08/2010,Episodes at 5 AM, eventually felt to be anxiety, after complete workup  including catheterization, pt greatly improved with anxiety meds 11/11   Past Surgical History:  Procedure Laterality Date  . CARDIAC CATHETERIZATION    . ESOPHAGOGASTRODUODENOSCOPY N/A 04/05/2015   Procedure: ESOPHAGOGASTRODUODENOSCOPY (EGD);  Surgeon: Scot Jun, MD;  Location: [facility] ENDOSCOPY;  Service: Endoscopy;  Laterality: N/A;  . ESOPHAGOGASTRODUODENOSCOPY N/A 04/17/2015   Procedure: ESOPHAGOGASTRODUODENOSCOPY (EGD);  Surgeon: Scot Jun, MD;  Location: [facility] ENDOSCOPY;  Service: Endoscopy;  Laterality: N/A;  . ESOPHAGOGASTRODUODENOSCOPY N/A 08/02/2015   Procedure: ESOPHAGOGASTRODUODENOSCOPY (EGD);  Surgeon: Wallace Cullens, MD;  Location: [facility] ENDOSCOPY;  Service: Endoscopy;  Laterality: N/A;  . HERNIA REPAIR    . JOINT REPLACEMENT    . TOTAL HIP ARTHROPLASTY    . VALVE REPLACEMENT  1/02   Aortic; echo 3/09 valve working well; echo 10/11 working well; put on Coumadin    Allergies  Allergies  Allergen Reactions  . Levofloxacin Nausea Only  . Sulfa Antibiotics Other (See Comments)    Reaction:  Unknown     History of Present Illness    86 year old male with the above complex past medical history including aortic stenosis status post mechanical AVR and chronic Coumadin anticoagulation, permanent atrial fibrillation, chronic diastolic congestive heart failure with an EF of 55-60%, hypertension, hyperlipidemia, chronic dyspnea, GERD, depression, and chronic venous stasis.  In September 2020, he was noticing more significant lower extremity swelling and his torsemide was adjusted to 40 mg twice daily.  Metolazone was added at up to 3 times per week.  When he was seen back on November 30, his weight was down to 206 pounds on our scale and he was maintained on current diuretic doses.  He says that since then, he has continued to have some increase in abdominal girth and left greater than right lower extremity swelling in the setting of venous stasis and varicose veins being followed by  vascular surgery.  In that setting, he does have stable, chronic dyspnea on exertion but denies chest pain, palpitations, PND, orthopnea, dizziness, syncope, or early satiety.  His weight has been trending about 195 pounds on his home scale.  He is 208.4 pounds on our scale today.  He has been using metolazone only once a week and questions whether or not he can use it a second day a week.  He recently had labs through his primary care provider and was told that everything looked okay.  I cannot see those lab results in [place] currently.  Home Medications    Prior to Admission medications   Medication Sig Start Date End Date Taking? Authorizing Provider  baclofen (LIORESAL) 10 MG tablet Take 10 mg by mouth 3 (three) times daily. 11/02/19  Yes [provider]  Cetirizine HCl 10 MG CAPS Take 10 mg by mouth daily.   Yes [provider]  citalopram (CELEXA) 20 MG tablet Take 20 mg by mouth daily.   Yes [provider]  cloNIDine (CATAPRES) 0.1 MG tablet TAKE 1 TABLET BY MOUTH TWO  TIMES DAILY 02/08/19  Yes Gollan, Kathlene November, MD  doxycycline (VIBRAMYCIN) 100 MG capsule Take 1 capsule (100 mg total) by mouth 2 (two) times daily. 12/15/19  Yes Kris Hartmann, NP  finasteride (PROSCAR) 5 MG tablet Take 5 mg by mouth daily.     Yes [provider]  isosorbide mononitrate (IMDUR) 60 MG 24 hr tablet Take 1 tablet (60 mg total) by mouth 2 (two) times daily. 11/22/19  Yes Gollan, Kathlene November, MD  metolazone (ZAROXOLYN) 2.5 MG tablet Take 1 tablet (2.5MG ) by mouth THREE times a WEEK 08/23/19  Yes Gollan, Kathlene November, MD  metoprolol succinate (TOPROL-XL) 25 MG 24 hr tablet Take 0.5 tablets (12.5 mg total) by mouth daily. 11/24/18  Yes Earleen Newport, MD  potassium chloride (K-DUR) 10 MEQ tablet Take 3 tablets (30 mEq total) by mouth daily. Take an additional 56meq only on the days you take metolazone. 08/23/19 12/29/19 Yes Gollan, Kathlene November, MD  ranitidine (ZANTAC) 150 MG  tablet Take 150 mg by mouth as needed.   Yes [provider]  rOPINIRole (REQUIP) 2 MG tablet TAKE 1 TABLET BY MOUTH 4  TIMES DAILY 09/14/18  Yes Ward Givens, NP  Tamsulosin HCl (FLOMAX) 0.4 MG CAPS Take 0.4 mg by mouth at bedtime.    Yes [provider]  torsemide (DEMADEX) 20 MG tablet Take TWO tablets by mouth TWICE a day. 08/23/19  Yes Gollan, Kathlene November, MD  traMADol (ULTRAM) 50 MG tablet Take 50 mg by mouth 2 (two) times daily as needed. 11/02/19  Yes [provider]  traZODone (DESYREL) 50 MG tablet Take 50 mg by mouth at bedtime.   Yes [provider]  warfarin (COUMADIN) 5 MG tablet TAKE 1 TABLET DAILY EXCEPT  1 AND 1/2 TABS ON MONDAY,  WEDNESDAY, AND FRIDAY OR AS DIRECTED BY COUMADIN  CLINIC. 11/15/19  Yes Gollan, Kathlene November, MD    Review of Systems    Chronic,  stable dyspnea on exertion.  Some increase in abdominal girth with left greater than right lower extremity swelling.  He denies chest pain, palpitations, PND, orthopnea, dizziness, syncope, or early satiety.  All other systems reviewed and are otherwise negative except as noted above.  Physical Exam    VS:  BP 127/71 (BP Location: Left Arm, Patient Position: Sitting, Cuff Size: Normal)   Pulse (!) 59   Ht 5\' 11"  (1.803 m)   Wt 208 lb 4 oz (94.5 kg)   SpO2 98%   BMI 29.04 kg/m  , BMI Body mass index is 29.04 kg/m. GEN: Well nourished, well developed, in no acute distress. HEENT: normal. Neck: Supple, no JVD, carotid bruits, or masses. Cardiac: Irregularly irregular with a mechanical S2.  No murmurs, rubs, or gallops. No clubbing, cyanosis.  Trace to 1+ right lower extremity woody edema with 1-2+ left lower extremity edema.  The left lower extremity is currently wrapped.    Respiratory:  Respirations regular and unlabored, clear to auscultation bilaterally. GI: Soft, nontender, nondistended, BS + x 4. MS: no deformity or atrophy. Skin: warm and dry, no rash. Neuro:  Strength and  sensation are intact. Psych: Normal affect.  Accessory Clinical Findings    ECG personally reviewed by me today -atrial fibrillation, 59- no acute changes.  Lab Results  Component Value Date   WBC 4.7 11/24/2018   HGB 12.3 (L) 11/24/2018   HCT 36.9 (L) 11/24/2018   MCV 91.1 11/24/2018   PLT 145 (L) 11/24/2018   Lab Results  Component Value Date   CREATININE 1.18 10/25/2019   BUN 35 (H) 10/25/2019   NA 139 10/25/2019   K 4.0 10/25/2019   CL 92 (L) 10/25/2019   CO2 27 10/25/2019   Lab Results  Component Value Date   ALT 15 (L) 02/22/2017   AST 23 02/22/2017   ALKPHOS 67 02/22/2017   BILITOT 0.9 02/22/2017     Lab Results  Component Value Date   HGBA1C 5.3 10/31/2014    Assessment & Plan    1.  Acute on chronic diastolic congestive heart failure: EF 55-60% by echo in December 2019.  He has had increased volume since September 2020 and has been taking torsemide 40 mg daily with metolazone 2.5 mg once a week.  This was previously prescribed to be taken 2-3 times per week.  He is interested in taking it an additional day a week and advised that he may do so.  Saturdays seems to be a good day for him to take it because he is very few appointments and I therefore recommended that the second day should likely be Wednesday if possible.  He recently had labs with his primary care provider on January 22 and was told that these looked good.  As he is going to escalate his metolazone dose, we will plan to follow-up a basic metabolic panel in about 10 days.  His heart rate and blood pressure controlled today on beta-blocker and clonidine therapy.  2.  Essential hypertension: Stable on beta-blocker, clonidine, diuretic, and nitrate therapy.  3.  Nonobstructive CAD: No chest pain.  Continue beta-blocker therapy.  4.  Permanent atrial fibrillation: Rate controlled on beta-blocker.  He is anticoagulated with warfarin.  5.  Status post mechanical aortic valve replacement: Trivial AI by  echo May 2019.  On chronic Coumadin.  6.  Disposition: Follow-up basic metabolic panel in approximately 10 days.  Follow-up in clinic in 1 month to reevaluate volume on higher frequency of metolazone.  Nicolasa Ducking, NP 12/29/2019, 5:12 PM

## 2020-01-02 ENCOUNTER — Encounter (INDEPENDENT_AMBULATORY_CARE_PROVIDER_SITE_OTHER): Payer: Self-pay | Admitting: Nurse Practitioner

## 2020-01-03 ENCOUNTER — Telehealth: Payer: Self-pay | Admitting: *Deleted

## 2020-01-03 NOTE — Telephone Encounter (Signed)
Left voicemail message to call back for results.  

## 2020-01-03 NOTE — Telephone Encounter (Signed)
-----   Message from Antonieta Iba, MD sent at 01/01/2020 12:08 PM EST ----- [facility] don't know who ordered the labs, But they look good

## 2020-01-03 NOTE — Telephone Encounter (Signed)
Spoke with patient and reviewed results from provider review. He verbalized understanding with no further questions at this time.

## 2020-01-04 ENCOUNTER — Other Ambulatory Visit: Payer: Self-pay | Admitting: Cardiovascular Disease

## 2020-01-05 ENCOUNTER — Other Ambulatory Visit: Payer: Self-pay

## 2020-01-05 ENCOUNTER — Encounter (INDEPENDENT_AMBULATORY_CARE_PROVIDER_SITE_OTHER): Payer: Self-pay

## 2020-01-05 ENCOUNTER — Ambulatory Visit (INDEPENDENT_AMBULATORY_CARE_PROVIDER_SITE_OTHER): Payer: Medicare Other | Admitting: Nurse Practitioner

## 2020-01-05 VITALS — BP 124/63 | HR 66 | Resp 16 | Wt 202.0 lb

## 2020-01-05 DIAGNOSIS — I890 Lymphedema, not elsewhere classified: Secondary | ICD-10-CM | POA: Diagnosis not present

## 2020-01-05 NOTE — Progress Notes (Signed)
History of Present Illness  There is no documented history at this time  Assessments & Plan   There are no diagnoses linked to this encounter.    Additional instructions  Subjective:  Patient presents with venous ulcer of the Left lower extremity.    Procedure:  3 layer unna wrap was placed Left lower extremity.   Plan:   Follow up in one week.  

## 2020-01-06 ENCOUNTER — Encounter (INDEPENDENT_AMBULATORY_CARE_PROVIDER_SITE_OTHER): Payer: Self-pay | Admitting: Nurse Practitioner

## 2020-01-10 ENCOUNTER — Telehealth: Payer: Self-pay

## 2020-01-10 ENCOUNTER — Other Ambulatory Visit: Payer: Self-pay

## 2020-01-10 ENCOUNTER — Ambulatory Visit (INDEPENDENT_AMBULATORY_CARE_PROVIDER_SITE_OTHER): Payer: Medicare Other

## 2020-01-10 ENCOUNTER — Other Ambulatory Visit
Admission: RE | Admit: 2020-01-10 | Discharge: 2020-01-10 | Disposition: A | Discharge status: Home / Self Care | Payer: Medicare Other | Source: Ambulatory Visit | Attending: Nurse Practitioner | Admitting: Nurse Practitioner

## 2020-01-10 DIAGNOSIS — Z7901 Long term (current) use of anticoagulants: Secondary | ICD-10-CM

## 2020-01-10 DIAGNOSIS — I350 Nonrheumatic aortic (valve) stenosis: Secondary | ICD-10-CM | POA: Diagnosis not present

## 2020-01-10 DIAGNOSIS — I5033 Acute on chronic diastolic (congestive) heart failure: Secondary | ICD-10-CM | POA: Diagnosis present

## 2020-01-10 DIAGNOSIS — Z952 Presence of prosthetic heart valve: Secondary | ICD-10-CM | POA: Diagnosis not present

## 2020-01-10 LAB — BASIC METABOLIC PANEL
Anion gap: 14 (ref 5–15)
BUN: 46 mg/dL — ABNORMAL HIGH (ref 8–23)
CO2: 33 mmol/L — ABNORMAL HIGH (ref 22–32)
Calcium: 9 mg/dL (ref 8.9–10.3)
Chloride: 89 mmol/L — ABNORMAL LOW (ref 98–111)
Creatinine, Ser: 1.43 mg/dL — ABNORMAL HIGH (ref 0.61–1.24)
GFR calc Af Amer: 51 mL/min — ABNORMAL LOW (ref >60)
GFR calc non Af Amer: 44 mL/min — ABNORMAL LOW (ref >60)
Glucose, Bld: 99 mg/dL (ref 70–99)
Potassium: 3.2 mmol/L — ABNORMAL LOW (ref 3.5–5.1)
Sodium: 136 mmol/L (ref 135–145)

## 2020-01-10 LAB — POCT INR: INR: 4.7 — AB (ref 2.0–3.0)

## 2020-01-10 MED ORDER — WARFARIN SODIUM 5 MG PO TABS: ORAL_TABLET | ORAL | 0 refills | Status: DC

## 2020-01-10 MED ORDER — POTASSIUM CHLORIDE ER 10 MEQ PO TBCR: 30.0000 meq | EXTENDED_RELEASE_TABLET | Freq: Every day | ORAL | 1 refills | Status: DC

## 2020-01-10 NOTE — Telephone Encounter (Signed)
Pharmacy is requesting a refill on Warfarin.

## 2020-01-10 NOTE — Progress Notes (Signed)
Pt requests a refill on his potassium pills as he gets them thru mail order and they have not come in yet.  Sent in #[phone] per pt's request to [facility] & let Dr. Windell Hummingbird nurse know.

## 2020-01-10 NOTE — Patient Instructions (Signed)
Please skip warfarin tonight, then START NEW DOSAGE of 1 tablet every day.  Recheck INR in 2 weeks.

## 2020-01-11 ENCOUNTER — Telehealth: Payer: Self-pay

## 2020-01-11 DIAGNOSIS — I5033 Acute on chronic diastolic (congestive) heart failure: Secondary | ICD-10-CM

## 2020-01-11 MED ORDER — METOLAZONE 2.5 MG PO TABS: 2.5000 mg | ORAL_TABLET | ORAL | 1 refills | Status: DC

## 2020-01-11 NOTE — Telephone Encounter (Signed)
Attempted to call patient. LMTCB 01/11/2020   

## 2020-01-11 NOTE — Telephone Encounter (Signed)
-----   Message from Creig Hines, NP sent at 01/10/2020  2:33 PM EST ----- We recently increased to metolazone to twice a week.  His kidney's now look a little dry with his creatine rising from a baseline of ~ 1.0 to 1.43.  Potassium is also low, which is common on a higher dose of metolazone.  Sometimes we have to allow the kidneys to run a little dryer in order to keep fluid off and stabilize symptoms.  At this time, I recommend reducing metolazone back to once a week, and taking an additional dose up to one time/week (total 2 doses/wk) for weight gain > 3lbs.  He should take an additional of potassium today and otherwise plan to continue to take 30 meq daily and on metolazone days, he should take a total of 80 meq.  F/u bmet in 1 wk.

## 2020-01-11 NOTE — Telephone Encounter (Signed)
Patient is returning your call.  

## 2020-01-11 NOTE — Telephone Encounter (Signed)
Call to patient to review results.   [facility]   Rx updated per request from Ward Givens, NP.   Pt verbalized understanding and had no further questions at this time.   Advised pt to call for any further questions or concerns.

## 2020-01-12 ENCOUNTER — Encounter (INDEPENDENT_AMBULATORY_CARE_PROVIDER_SITE_OTHER): Payer: Self-pay | Admitting: Nurse Practitioner

## 2020-01-12 ENCOUNTER — Other Ambulatory Visit: Payer: Self-pay

## 2020-01-12 ENCOUNTER — Ambulatory Visit (INDEPENDENT_AMBULATORY_CARE_PROVIDER_SITE_OTHER): Payer: Medicare Other | Admitting: Nurse Practitioner

## 2020-01-12 VITALS — BP 148/79 | HR 72 | Resp 16 | Wt 200.0 lb

## 2020-01-12 DIAGNOSIS — L97321 Non-pressure chronic ulcer of left ankle limited to breakdown of skin: Secondary | ICD-10-CM

## 2020-01-12 DIAGNOSIS — I10 Essential (primary) hypertension: Secondary | ICD-10-CM

## 2020-01-12 DIAGNOSIS — L97222 Non-pressure chronic ulcer of left calf with fat layer exposed: Secondary | ICD-10-CM

## 2020-01-12 DIAGNOSIS — I83022 Varicose veins of left lower extremity with ulcer of calf: Secondary | ICD-10-CM | POA: Diagnosis not present

## 2020-01-17 ENCOUNTER — Other Ambulatory Visit: Payer: Self-pay | Admitting: *Deleted

## 2020-01-17 MED ORDER — ISOSORBIDE MONONITRATE ER 60 MG PO TB24: 60.0000 mg | ORAL_TABLET | Freq: Two times a day (BID) | ORAL | 0 refills | Status: DC

## 2020-01-18 ENCOUNTER — Encounter (INDEPENDENT_AMBULATORY_CARE_PROVIDER_SITE_OTHER): Payer: Self-pay | Admitting: Nurse Practitioner

## 2020-01-18 ENCOUNTER — Other Ambulatory Visit: Payer: Self-pay

## 2020-01-18 ENCOUNTER — Other Ambulatory Visit
Admission: RE | Admit: 2020-01-18 | Discharge: 2020-01-18 | Disposition: A | Discharge status: Home / Self Care | Payer: Medicare Other | Source: Ambulatory Visit | Attending: Nurse Practitioner | Admitting: Nurse Practitioner

## 2020-01-18 ENCOUNTER — Other Ambulatory Visit: Payer: Self-pay | Admitting: *Deleted

## 2020-01-18 DIAGNOSIS — I5033 Acute on chronic diastolic (congestive) heart failure: Secondary | ICD-10-CM | POA: Insufficient documentation

## 2020-01-18 LAB — BASIC METABOLIC PANEL
Anion gap: 13 (ref 5–15)
BUN: 43 mg/dL — ABNORMAL HIGH (ref 8–23)
CO2: 30 mmol/L (ref 22–32)
Calcium: 8.7 mg/dL — ABNORMAL LOW (ref 8.9–10.3)
Chloride: 92 mmol/L — ABNORMAL LOW (ref 98–111)
Creatinine, Ser: 1.23 mg/dL (ref 0.61–1.24)
GFR calc Af Amer: >60 mL/min (ref >60)
GFR calc non Af Amer: 53 mL/min — ABNORMAL LOW (ref >60)
Glucose, Bld: 119 mg/dL — ABNORMAL HIGH (ref 70–99)
Potassium: 3.5 mmol/L (ref 3.5–5.1)
Sodium: 135 mmol/L (ref 135–145)

## 2020-01-18 MED ORDER — WARFARIN SODIUM 5 MG PO TABS: ORAL_TABLET | ORAL | 0 refills | Status: DC

## 2020-01-18 NOTE — Progress Notes (Signed)
SUBJECTIVE:  Patient ID: [name], male    DOB: September 10, 1931, 86 y.o.   MRN: [id number] Chief Complaint  Patient presents with  . Follow-up    unna check    HPI  [name] is a 87 y.o. male Patient is seen for follow up evaluation of leg pain and swelling associated with venous ulceration. The patient was recently seen here and started on Unna boot therapy.  The swelling abruptly became much worse bilaterally and is associated with pain and discoloration. The pain and swelling worsens with prolonged dependency and improves with elevation.  The patient notes that in the morning the legs are better but the leg symptoms worsened throughout the course of the day. The patient has also noted a progressive worsening of the discoloration in the ankle and shin area.   The patient notes that an ulcer has developed acutely without specific trauma and since it occurred it has been very slow to heal.  There is a moderate amount of drainage associated with the open area.  The wound is also very painful.  He has been tolerating the Unna boots well, the wound appears smaller than previously.  The patient also no longer has any weeping present.  The patient states that they have been elevating as much as possible. The patient denies any recent changes in medications.  The patient denies a history of DVT or PE. There is no prior history of phlebitis. There is no history of primary lymphedema.  No SOB or increased cough.  No sputum production.  No recent episodes of CHF exacerbation.   Past Medical History:  Diagnosis Date  . Anxiety 10/11  . Aortic stenosis    a. s/p mechcanical AVR, 2002; b. 10/2018 Echo: Triv AI, mean grad .  . Bradycardia    chronic, no symptoms 07/2010  . C. difficile colitis   . Carotid bruit    dopplers in past, no abnormalities  . Chronic diastolic CHF (congestive heart failure) (HCC)    a. Echo 03/2015: EF 60-65%, no RWMA, GR1DD, mild BAE, mild to  mod MR, mod TR, PASP 65 mmHg; b. 01/2017 Echo: EF 55-60%, NRWMA, grade 1 diastolic dysfunction.  Normal functioning prosthetic aortic valve.  Mean gradient 50 mmHg.  Sev TR. PASP ; c. 10/2018 Echo: EF 55-60%, Triv AI, mod dil LA, mod-sev TR, PASP 35-40, mild to mod red RV fxn.  . Coronary artery disease    a. mild, cath, 08/2010; b. medically managed  . Decreased hearing    Right ear  . Depression   . Gastric ulcer   . GERD (gastroesophageal reflux disease)   . Hypertension    BP higher than usual 04/19/10; amlodipine increased by telephone  . Mod-Sev Tricuspid regurgitation    a. 10/2018 Echo: Mod-Sev TR, PASP 35-70mmHg.  Marland Kitchen RLS (restless legs syndrome) 08/23/2015  . S/P AVR    a. [employer]. mechanical 2002; b. echo 08/2010 EF 60%, trival AI, mild MR, AVR working well; c. on longterm warfarin tx  . SOB (shortness of breath) 10/11   08/2010,Episodes at 5 AM, eventually felt to be anxiety, after complete workup including catheterization, pt greatly improved with anxiety meds 11/11    Past Surgical History:  Procedure Laterality Date  . CARDIAC CATHETERIZATION    . ESOPHAGOGASTRODUODENOSCOPY N/A 04/05/2015   Procedure: ESOPHAGOGASTRODUODENOSCOPY (EGD);  Surgeon: Scot Jun, MD;  Location: [facility] ENDOSCOPY;  Service: Endoscopy;  Laterality: N/A;  . ESOPHAGOGASTRODUODENOSCOPY N/A 04/17/2015   Procedure: ESOPHAGOGASTRODUODENOSCOPY (EGD);  Surgeon: Scot Jun, MD;  Location: [facility] ENDOSCOPY;  Service: Endoscopy;  Laterality: N/A;  . ESOPHAGOGASTRODUODENOSCOPY N/A 08/02/2015   Procedure: ESOPHAGOGASTRODUODENOSCOPY (EGD);  Surgeon: Wallace Cullens, MD;  Location: [facility] ENDOSCOPY;  Service: Endoscopy;  Laterality: N/A;  . HERNIA REPAIR    . JOINT REPLACEMENT    . TOTAL HIP ARTHROPLASTY    . VALVE REPLACEMENT  1/02   Aortic; echo 3/09 valve working well; echo 10/11 working well; put on Coumadin    Social History   Socioeconomic History  . Marital status: Married    Spouse name: Not on  file  . Number of children: 3  . Years of education: some coll.  . Highest education level: Not on file  Occupational History  . Occupation: Retired    Associate Professor: RETIRED  Tobacco Use  . Smoking status: Former Smoker    Types: Cigarettes    Quit date: 1979    Years since quitting: 42.1  . Smokeless tobacco: Never Used  Substance and Sexual Activity  . Alcohol use: Yes    Alcohol/week: 3.0 standard drinks    Types: 3 Glasses of wine per week    Comment: Social  . Drug use: No  . Sexual activity: Never  Other Topics Concern  . Not on file  Social History Narrative   No regular exercise.   Patient lives at [facility]. His wife lives in same facility but she lives in the assisted living part.      Patient drinks 1-2 cups of caffeine daily.   Patient is right handed.   Social Determinants of Health   Financial Resource Strain:   . Difficulty of Paying Living Expenses: Not on file  Food Insecurity:   . Worried About Programme researcher, broadcasting/film/video in the Last Year: Not on file  . Ran Out of Food in the Last Year: Not on file  Transportation Needs:   . Lack of Transportation (Medical): Not on file  . Lack of Transportation (Non-Medical): Not on file  Physical Activity:   . Days of Exercise per Week: Not on file  . Minutes of Exercise per Session: Not on file  Stress:   . Feeling of Stress : Not on file  Social Connections:   . Frequency of Communication with Friends and Family: Not on file  . Frequency of Social Gatherings with Friends and Family: Not on file  . Attends Religious Services: Not on file  . Active Member of Clubs or Organizations: Not on file  . Attends Banker Meetings: Not on file  . Marital Status: Not on file  Intimate Partner Violence:   . Fear of Current or Ex-Partner: Not on file  . Emotionally Abused: Not on file  . Physically Abused: Not on file  . Sexually Abused: Not on file    Family History  Problem Relation Age of Onset  . Hypertension  Mother   . Diabetes type II Mother   . Heart disease Mother   . Heart attack Mother   . Hypertension Father   . Heart disease Father   . Stroke Father   . Stroke Brother   . Stroke Brother   . Hypertension Other     Allergies  Allergen Reactions  . Levofloxacin Nausea Only  . Sulfa Antibiotics Other (See Comments)    Reaction:  Unknown      Review of Systems   Review of Systems: Negative Unless Checked Constitutional: [] Weight loss  [] Fever  [] Chills Cardiac: [] Chest pain   []   Atrial Fibrillation  [] Palpitations   [] Shortness of breath when laying flat   [] Shortness of breath with exertion. [] Shortness of breath at rest Vascular:  [] Pain in legs with walking   [] Pain in legs with standing [] Pain in legs when laying flat   [] Claudication    [] Pain in feet when laying flat    [] History of DVT   [] Phlebitis   [x] Swelling in legs   [x] Varicose veins   [] Non-healing ulcers Pulmonary:   [] Uses home oxygen   [] Productive cough   [] Hemoptysis   [] Wheeze  [] COPD   [] Asthma Neurologic:  [] Dizziness   [] Seizures  [] Blackouts [] History of stroke   [] History of TIA  [] Aphasia   [] Temporary Blindness   [] Weakness or numbness in arm   [] Weakness or numbness in leg Musculoskeletal:   [] Joint swelling   [] Joint pain   [] Low back pain  []  History of Knee Replacement [] Arthritis [] back Surgeries  []  Spinal Stenosis    Hematologic:  [] Easy bruising  [] Easy bleeding   [] Hypercoagulable state   [] Anemic Gastrointestinal:  [] Diarrhea   [] Vomiting  [] Gastroesophageal reflux/heartburn   [] Difficulty swallowing. [] Abdominal pain Genitourinary:  [] Chronic kidney disease   [] Difficult urination  [] Anuric   [] Blood in urine [] Frequent urination  [] Burning with urination   [] Hematuria Skin:  [] Rashes   [x] Ulcers [] Wounds Psychological:  [x] History of anxiety   []  History of major depression  []  Memory Difficulties      OBJECTIVE:   Physical Exam  BP (!) 148/79 (BP Location: Left Arm)   Pulse 72   Resp 16    Wt 200 lb (90.7 kg)   BMI 27.89 kg/m   Gen: WD/WN, NAD Head: [place]/AT, No temporalis wasting.  Ear/Nose/Throat: Hearing grossly intact, nares w/o erythema or drainage Eyes: PER, EOMI, sclera nonicteric.  Neck: Supple, no masses.  No JVD.  Pulmonary:  Good air movement, no use of accessory muscles.  Cardiac: RRR Vascular:  Medial ulceration on left lower extremity, about dime sized Vessel Right Left  Radial Palpable Palpable   Gastrointestinal: soft, non-distended. No guarding/no peritoneal signs.  Musculoskeletal: M/S 5/5 throughout.  No deformity or atrophy.  Neurologic: Pain and light touch intact in extremities.  Symmetrical.  Speech is fluent. Motor exam as listed above. Psychiatric: Judgment intact, Mood & affect appropriate for pt's clinical situation. Dermatologic: No Venous rashes. No Ulcers Noted.  No changes consistent with cellulitis. Lymph : No Cervical lymphadenopathy, no lichenification or skin changes of chronic lymphedema.       ASSESSMENT AND PLAN:  1. 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  Patient is aware and in agreement with this plan.  2. Lower limb ulcer, ankle, left, limited to breakdown of skin (HCC) No surgery or intervention at this point in time.    I have had a long discussion with the patient regarding venous insufficiency and why it  causes symptoms, specifically venous ulceration . I have discussed with the patient the chronic skin changes that accompany venous insufficiency and the long term sequela such as infection and recurring  ulceration.  Patient will be placed in which will be changed weekly drainage permitting.  In addition, behavioral modification including several periods of elevation of the lower extremities during the day will be continued. Achieving a position with the ankles  at heart level was stressed to the patient  The patient is  instructed to begin routine exercise, especially walking on a daily basis  Patient will follow up in 4 weeks for evaluation of progression with wound healing.   3. Essential hypertension Continue antihypertensive medications as already ordered, these medications have been reviewed and there are no changes at this time.    Current Outpatient Medications on File Prior to Visit  Medication Sig Dispense Refill  . baclofen (LIORESAL) 10 MG tablet Take 10 mg by mouth 3 (three) times daily.    . Cetirizine HCl 10 MG CAPS Take 10 mg by mouth daily.    . citalopram (CELEXA) 20 MG tablet Take 20 mg by mouth daily.    . cloNIDine (CATAPRES) 0.1 MG tablet TAKE 1 TABLET BY MOUTH  TWICE DAILY 180 tablet 3  . finasteride (PROSCAR) 5 MG tablet Take 5 mg by mouth daily.      . metolazone (ZAROXOLYN) 2.5 MG tablet Take 1 tablet (2.5 mg total) by mouth once a week. 12 tablet 1  . metoprolol succinate (TOPROL-XL) 25 MG 24 hr tablet Take 0.5 tablets (12.5 mg total) by mouth daily. 90 tablet 3  . potassium chloride (KLOR-CON) 10 MEQ tablet Take 3 tablets (30 mEq total) by mouth daily. Take an additional 93meq only on the days you take metolazone. 18 tablet 1  . ranitidine (ZANTAC) 150 MG tablet Take 150 mg by mouth as needed.    Marland Kitchen rOPINIRole (REQUIP) 2 MG tablet TAKE 1 TABLET BY MOUTH 4  TIMES DAILY 360 tablet 3  . Tamsulosin HCl (FLOMAX) 0.4 MG CAPS Take 0.4 mg by mouth at bedtime.     . torsemide (DEMADEX) 20 MG tablet Take TWO tablets by mouth TWICE a day. 270 tablet 3  . traZODone (DESYREL) 50 MG tablet Take 50 mg by mouth at bedtime.    Marland Kitchen doxycycline (VIBRAMYCIN) 100 MG capsule Take 1 capsule (100 mg total) by mouth 2 (two) times daily. (Patient not taking: Reported on 01/12/2020) 14 capsule 0  . traMADol (ULTRAM) 50 MG tablet Take 50 mg by mouth 2 (two) times daily as needed.     No current facility-administered medications on file prior to  visit.    There are no Patient Instructions on file for this visit. No follow-ups on file.   Kris Hartmann, NP  This note was completed with Sales executive.  Any errors are purely unintentional.

## 2020-01-18 NOTE — Telephone Encounter (Signed)
Warfarin refill was previously sent electronically on 01/10/2020 by another user but a paper refill was sent over as it needed updated information with the clarified amount of day supply the medication is for. Also, it stated it was the final attempt so sending electronically.

## 2020-01-19 ENCOUNTER — Ambulatory Visit (INDEPENDENT_AMBULATORY_CARE_PROVIDER_SITE_OTHER): Payer: Medicare Other | Admitting: Nurse Practitioner

## 2020-01-19 ENCOUNTER — Encounter (INDEPENDENT_AMBULATORY_CARE_PROVIDER_SITE_OTHER): Payer: Self-pay

## 2020-01-19 ENCOUNTER — Telehealth: Payer: Self-pay | Admitting: *Deleted

## 2020-01-19 VITALS — BP 129/76 | HR 65 | Resp 16 | Wt 205.0 lb

## 2020-01-19 DIAGNOSIS — I890 Lymphedema, not elsewhere classified: Secondary | ICD-10-CM

## 2020-01-19 DIAGNOSIS — I83022 Varicose veins of left lower extremity with ulcer of calf: Secondary | ICD-10-CM

## 2020-01-19 DIAGNOSIS — L97222 Non-pressure chronic ulcer of left calf with fat layer exposed: Secondary | ICD-10-CM | POA: Diagnosis not present

## 2020-01-19 NOTE — Telephone Encounter (Signed)
Patient informed. Copy sent to PCP °

## 2020-01-19 NOTE — Telephone Encounter (Signed)
-----   Message from Creig Hines, NP sent at 01/19/2020  7:20 AM EST ----- Renal fxn improved. Potassium is on low end of normal.  Cont current plan of metolazone and potassium dosing.

## 2020-01-19 NOTE — Progress Notes (Signed)
History of Present Illness  There is no documented history at this time  Assessments & Plan   There are no diagnoses linked to this encounter.    Additional instructions  Subjective:  Patient presents with venous ulcer of the Left lower extremity.    Procedure:  3 layer unna wrap was placed Left lower extremity.   Plan:   Follow up in one week.  

## 2020-01-24 ENCOUNTER — Other Ambulatory Visit: Payer: Self-pay

## 2020-01-24 ENCOUNTER — Encounter (INDEPENDENT_AMBULATORY_CARE_PROVIDER_SITE_OTHER): Payer: Self-pay | Admitting: Nurse Practitioner

## 2020-01-24 ENCOUNTER — Ambulatory Visit (INDEPENDENT_AMBULATORY_CARE_PROVIDER_SITE_OTHER): Payer: Medicare Other

## 2020-01-24 DIAGNOSIS — Z7901 Long term (current) use of anticoagulants: Secondary | ICD-10-CM | POA: Diagnosis not present

## 2020-01-24 DIAGNOSIS — Z952 Presence of prosthetic heart valve: Secondary | ICD-10-CM | POA: Diagnosis not present

## 2020-01-24 DIAGNOSIS — I350 Nonrheumatic aortic (valve) stenosis: Secondary | ICD-10-CM

## 2020-01-24 DIAGNOSIS — Z5181 Encounter for therapeutic drug level monitoring: Secondary | ICD-10-CM | POA: Diagnosis not present

## 2020-01-24 LAB — POCT INR: INR: 2.5 (ref 2.0–3.0)

## 2020-01-24 NOTE — Patient Instructions (Signed)
-   Continue warfarin dosage of 1 tablet every day.  - Recheck INR in 3 weeks.

## 2020-01-26 ENCOUNTER — Encounter (INDEPENDENT_AMBULATORY_CARE_PROVIDER_SITE_OTHER): Payer: Self-pay

## 2020-01-26 ENCOUNTER — Other Ambulatory Visit: Payer: Self-pay

## 2020-01-26 ENCOUNTER — Telehealth: Payer: Self-pay | Admitting: Cardiovascular Disease

## 2020-01-26 ENCOUNTER — Ambulatory Visit (INDEPENDENT_AMBULATORY_CARE_PROVIDER_SITE_OTHER): Payer: Medicare Other | Admitting: Nurse Practitioner

## 2020-01-26 VITALS — BP 160/76 | HR 76 | Resp 16 | Wt 206.0 lb

## 2020-01-26 DIAGNOSIS — L97321 Non-pressure chronic ulcer of left ankle limited to breakdown of skin: Secondary | ICD-10-CM

## 2020-01-26 DIAGNOSIS — R0600 Dyspnea, unspecified: Secondary | ICD-10-CM

## 2020-01-26 DIAGNOSIS — I5032 Chronic diastolic (congestive) heart failure: Secondary | ICD-10-CM

## 2020-01-26 NOTE — Telephone Encounter (Signed)
Left voicemail message to call back  

## 2020-01-26 NOTE — Progress Notes (Signed)
History of Present Illness  There is no documented history at this time  Assessments & Plan   There are no diagnoses linked to this encounter.    Additional instructions  Subjective:  Patient presents with venous ulcer of the Left lower extremity.    Procedure:  3 layer unna wrap was placed Left lower extremity.   Plan:   Follow up in one week.  

## 2020-01-26 NOTE — Telephone Encounter (Signed)
At last check, he was taking torsemide 20 bid & metolazone 2.5mg  once/wk and also prn for weight gain.  We tried metolazone 2x/wk, however that resulted in a rise in creat w/ hypokalemia.  How many x/wk has he been taking metolazone recently?

## 2020-01-26 NOTE — Telephone Encounter (Signed)
Pt c/o medication issue:  1. Name of Medication: metolazone  2. How are you currently taking this medication (dosage and times per day)? Twice weekly  3. Are you having a reaction (difficulty breathing--STAT)? No   4. What is your medication issue? States the medication is not working as well as it did at the beginning. Patient is retainig more fluid in stomach

## 2020-01-27 ENCOUNTER — Encounter (INDEPENDENT_AMBULATORY_CARE_PROVIDER_SITE_OTHER): Payer: Self-pay | Admitting: Nurse Practitioner

## 2020-01-27 NOTE — Telephone Encounter (Signed)
Call to patient to inquire how often he has been taking metolazone.   He reports taking once so far this week and 2 x weekly in the last few weeks.   Routing to provider to make aware.

## 2020-01-27 NOTE — Telephone Encounter (Signed)
Patient calling back from voicemail. Please advise when able

## 2020-01-27 NOTE — Telephone Encounter (Signed)
If he thinks he has had some improvement in volume status with twice weekly metolazone, he can continue in this fashion however, we will just need to keep a closer eye on his renal function and electrolytes (would follow-up basic metabolic panel next week).  That he has had an increased need for metolazone, we should plan to follow-up in echocardiogram to reevaluate heart squeezing and valvular function prior to his office visit with Dr. Mariah Milling later this month.

## 2020-01-28 NOTE — Addendum Note (Signed)
Addended by: Efrain Sella on: 01/28/2020 11:20 AM   Modules accepted: Orders

## 2020-01-28 NOTE — Telephone Encounter (Signed)
Call to patient to review POC from Ward Givens, NP.   Pt agreeable to return to [facility] in 1 week for repeat labs.   Scheduled echo 3/19.  Confirmed upcoming appt.   Advised pt to call for any further questions or concerns.

## 2020-02-02 ENCOUNTER — Other Ambulatory Visit: Payer: Self-pay

## 2020-02-02 ENCOUNTER — Ambulatory Visit (INDEPENDENT_AMBULATORY_CARE_PROVIDER_SITE_OTHER): Payer: Medicare Other | Admitting: Nurse Practitioner

## 2020-02-02 VITALS — BP 139/71 | HR 73 | Resp 12 | Ht 71.0 in | Wt 206.0 lb

## 2020-02-02 DIAGNOSIS — L97222 Non-pressure chronic ulcer of left calf with fat layer exposed: Secondary | ICD-10-CM

## 2020-02-02 DIAGNOSIS — L97321 Non-pressure chronic ulcer of left ankle limited to breakdown of skin: Secondary | ICD-10-CM

## 2020-02-02 DIAGNOSIS — I83022 Varicose veins of left lower extremity with ulcer of calf: Secondary | ICD-10-CM

## 2020-02-02 NOTE — Progress Notes (Signed)
History of Present Illness  The patient returns for followup evaluation 3 months after the initial visit. The patient continues to have pain in the lower extremities with dependency. The pain is lessened with elevation. Graduated compression stockings, Class I (20-30 mmHg), have been worn but the stockings do not eliminate the leg pain. Over-the-counter analgesics do not improve the symptoms. The degree of discomfort continues to interfere with daily activities. The patient notes the pain in the legs is causing problems with daily exercise, at the workplace and even with household activities and maintenance such as standing in the kitchen preparing meals and doing dishes. Ulceration on the patient's left lower extremity has been improving however it is still present. Patient continues to do well in bilateral Unna wraps.  Venous ultrasound shows normal deep venous system, no evidence of acute or chronic DVT.  Superficial reflux is present in the left great saphenous vein from the proximal thigh to the proximal calf. Vein diameters range from 0.31 cm to 0.50 cm. There was no evidence of DVT or superficial venous imposes.  Assessments & Plan   1. Lower limb ulcer, ankle, left, limited to breakdown of skin [facility]) The patient will continue to be placed in Unna wraps on a weekly basis to be changed on a weekly basis in our office. We will have the patient reevaluated for his oral progression in 4 weeks.  2. 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 Patient will continue  wearing graduated compression stockings class 1 on a daily basis, beginning first thing in the morning and removing them in the evening.    In addition, behavioral modification including elevation during the day was again discussed and this will continue.  The patient has  utilized over the counter pain medications and has been exercising.  However, at this time conservative therapy has not alleviated the patient's symptoms of leg pain and swelling  Recommend: laser ablation of the left great saphenous veins to eliminate the symptoms of pain and swelling of the lower extremities caused by the severe superficial venous reflux disease.      Additional instructions  Subjective:  Patient presents with venous ulcer of the Left lower extremity.    Procedure:  3 layer unna wrap was placed Left lower extremity.   Plan:   Follow up in one week.

## 2020-02-03 ENCOUNTER — Other Ambulatory Visit: Payer: Self-pay

## 2020-02-03 ENCOUNTER — Encounter (INDEPENDENT_AMBULATORY_CARE_PROVIDER_SITE_OTHER): Payer: Self-pay | Admitting: Nurse Practitioner

## 2020-02-03 ENCOUNTER — Other Ambulatory Visit
Admission: RE | Admit: 2020-02-03 | Discharge: 2020-02-03 | Disposition: A | Discharge status: Home / Self Care | Payer: Medicare Other | Attending: Cardiovascular Disease | Admitting: Cardiovascular Disease

## 2020-02-03 DIAGNOSIS — I5032 Chronic diastolic (congestive) heart failure: Secondary | ICD-10-CM | POA: Insufficient documentation

## 2020-02-03 LAB — BASIC METABOLIC PANEL
Anion gap: 10 (ref 5–15)
BUN: 34 mg/dL — ABNORMAL HIGH (ref 8–23)
CO2: 30 mmol/L (ref 22–32)
Calcium: 8.7 mg/dL — ABNORMAL LOW (ref 8.9–10.3)
Chloride: 94 mmol/L — ABNORMAL LOW (ref 98–111)
Creatinine, Ser: 1.1 mg/dL (ref 0.61–1.24)
GFR calc Af Amer: >60 mL/min (ref >60)
GFR calc non Af Amer: >60 mL/min (ref >60)
Glucose, Bld: 98 mg/dL (ref 70–99)
Potassium: 3.6 mmol/L (ref 3.5–5.1)
Sodium: 134 mmol/L — ABNORMAL LOW (ref 135–145)

## 2020-02-04 ENCOUNTER — Telehealth: Payer: Self-pay

## 2020-02-04 NOTE — Telephone Encounter (Signed)
Call to patient. Pt verbalized understanding of result and POC.   No further orders at this time.   Confirmed upcoming appt.   Advised pt to call for any further questions or concerns.

## 2020-02-04 NOTE — Telephone Encounter (Signed)
-----   Message from Creig Hines, NP sent at 02/03/2020  2:36 PM EST ----- Renal fxn, potassium stable.

## 2020-02-07 ENCOUNTER — Other Ambulatory Visit (INDEPENDENT_AMBULATORY_CARE_PROVIDER_SITE_OTHER): Payer: Self-pay | Admitting: Nurse Practitioner

## 2020-02-07 DIAGNOSIS — I890 Lymphedema, not elsewhere classified: Secondary | ICD-10-CM

## 2020-02-07 DIAGNOSIS — I872 Venous insufficiency (chronic) (peripheral): Secondary | ICD-10-CM

## 2020-02-08 ENCOUNTER — Encounter (INDEPENDENT_AMBULATORY_CARE_PROVIDER_SITE_OTHER): Payer: Self-pay | Admitting: Vascular Surgery

## 2020-02-08 ENCOUNTER — Ambulatory Visit (INDEPENDENT_AMBULATORY_CARE_PROVIDER_SITE_OTHER): Payer: Medicare Other

## 2020-02-08 ENCOUNTER — Other Ambulatory Visit: Payer: Self-pay

## 2020-02-08 ENCOUNTER — Ambulatory Visit (INDEPENDENT_AMBULATORY_CARE_PROVIDER_SITE_OTHER): Payer: Medicare Other | Admitting: Vascular Surgery

## 2020-02-08 VITALS — BP 140/59 | HR 70 | Ht 71.0 in | Wt 209.0 lb

## 2020-02-08 DIAGNOSIS — L97222 Non-pressure chronic ulcer of left calf with fat layer exposed: Secondary | ICD-10-CM

## 2020-02-08 DIAGNOSIS — I83022 Varicose veins of left lower extremity with ulcer of calf: Secondary | ICD-10-CM

## 2020-02-08 DIAGNOSIS — I10 Essential (primary) hypertension: Secondary | ICD-10-CM | POA: Diagnosis not present

## 2020-02-08 DIAGNOSIS — I890 Lymphedema, not elsewhere classified: Secondary | ICD-10-CM

## 2020-02-08 DIAGNOSIS — L97321 Non-pressure chronic ulcer of left ankle limited to breakdown of skin: Secondary | ICD-10-CM

## 2020-02-08 DIAGNOSIS — I83891 Varicose veins of right lower extremities with other complications: Secondary | ICD-10-CM | POA: Diagnosis not present

## 2020-02-08 DIAGNOSIS — I872 Venous insufficiency (chronic) (peripheral): Secondary | ICD-10-CM

## 2020-02-08 NOTE — Assessment & Plan Note (Signed)
Left GSV EVLT, approval pending.

## 2020-02-08 NOTE — Assessment & Plan Note (Signed)
Three layer UNNA boot replaced on the left leg today.

## 2020-02-08 NOTE — Assessment & Plan Note (Signed)
blood pressure control important in reducing the progression of atherosclerotic disease. On appropriate oral medications.  

## 2020-02-08 NOTE — Progress Notes (Signed)
MRN : [id number]  [name] is a 86 y.o. (Oct 10, 1931) male who presents with chief complaint of  Chief Complaint  Patient presents with  . Follow-up    U/S follow up  .  History of Present Illness: Patient returns today in follow up of his venous disease.  He continues to get weekly Unna boots for the nonhealing ulcer on the left leg.  As discussed previously by Sheppard Plumber our nurse practitioner, a left great saphenous vein laser ablation should be done on the left leg and we are awaiting approval.  He has been in compression for over 3 months and Unna boots now for many weeks. Today, we assessed his right leg as well.  He has some swelling in the right leg with compression stockings are actually doing a pretty good job of keeping this under control.  His venous reflux study today does show reflux in the right great saphenous vein throughout its course up to the saphenofemoral junction with reflux in the femoral vein as well.  Current Outpatient Medications  Medication Sig Dispense Refill  . baclofen (LIORESAL) 10 MG tablet Take 10 mg by mouth 3 (three) times daily.    . Cetirizine HCl 10 MG CAPS Take 10 mg by mouth daily.    . citalopram (CELEXA) 20 MG tablet Take 20 mg by mouth daily.    . cloNIDine (CATAPRES) 0.1 MG tablet TAKE 1 TABLET BY MOUTH  TWICE DAILY 180 tablet 3  . doxycycline (VIBRAMYCIN) 100 MG capsule Take 1 capsule (100 mg total) by mouth 2 (two) times daily. 14 capsule 0  . finasteride (PROSCAR) 5 MG tablet Take 5 mg by mouth daily.      . isosorbide mononitrate (IMDUR) 60 MG 24 hr tablet Take 1 tablet (60 mg total) by mouth 2 (two) times daily. 180 tablet 0  . metolazone (ZAROXOLYN) 2.5 MG tablet Take 1 tablet (2.5 mg total) by mouth once a week. 12 tablet 1  . metoprolol succinate (TOPROL-XL) 25 MG 24 hr tablet Take 0.5 tablets (12.5 mg total) by mouth daily. 90 tablet 3  . potassium chloride (KLOR-CON) 10 MEQ tablet Take 3 tablets (30 mEq total) by mouth  daily. Take an additional only on the days you take metolazone. 18 tablet 1  . ranitidine (ZANTAC) 150 MG tablet Take 150 mg by mouth as needed.    Marland Kitchen rOPINIRole (REQUIP) 2 MG tablet TAKE 1 TABLET BY MOUTH 4  TIMES DAILY 360 tablet 3  . Tamsulosin HCl (FLOMAX) 0.4 MG CAPS Take 0.4 mg by mouth at bedtime.     . torsemide (DEMADEX) 20 MG tablet Take TWO tablets by mouth TWICE a day. 270 tablet 3  . traMADol (ULTRAM) 50 MG tablet Take 50 mg by mouth 2 (two) times daily as needed.    . traZODone (DESYREL) 50 MG tablet Take 50 mg by mouth at bedtime.    Marland Kitchen warfarin (COUMADIN) 5 MG tablet Take 1 tablet daily or as directed by the Anticoagulation Clinic. 90 tablet 0   No current facility-administered medications for this visit.    Past Medical History:  Diagnosis Date  . Anxiety 10/11  . Aortic stenosis    a. s/p mechcanical AVR, 2002; b. 10/2018 Echo: Triv AI, mean grad .  . Bradycardia    chronic, no symptoms 07/2010  . C. difficile colitis   . Carotid bruit    dopplers in past, no abnormalities  . Chronic diastolic CHF (congestive heart failure) (HCC)  a. Echo 03/2015: EF 60-65%, no RWMA, GR1DD, mild BAE, mild to mod MR, mod TR, PASP 65 mmHg; b. 01/2017 Echo: EF 55-60%, NRWMA, grade 1 diastolic dysfunction.  Normal functioning prosthetic aortic valve.  Mean gradient 50 mmHg.  Sev TR. PASP ; c. 10/2018 Echo: EF 55-60%, Triv AI, mod dil LA, mod-sev TR, PASP 35-40, mild to mod red RV fxn.  . Coronary artery disease    a. mild, cath, 08/2010; b. medically managed  . Decreased hearing    Right ear  . Depression   . Gastric ulcer   . GERD (gastroesophageal reflux disease)   . Hypertension    BP higher than usual 04/19/10; amlodipine increased by telephone  . Mod-Sev Tricuspid regurgitation    a. 10/2018 Echo: Mod-Sev TR, PASP 35-75mmHg.  Marland Kitchen RLS (restless legs syndrome) 08/23/2015  . S/P AVR    a. [employer]. mechanical 2002; b. echo 08/2010 EF 60%, trival AI, mild MR, AVR working  well; c. on longterm warfarin tx  . SOB (shortness of breath) 10/11   08/2010,Episodes at 5 AM, eventually felt to be anxiety, after complete workup including catheterization, pt greatly improved with anxiety meds 11/11    Past Surgical History:  Procedure Laterality Date  . CARDIAC CATHETERIZATION    . ESOPHAGOGASTRODUODENOSCOPY N/A 04/05/2015   Procedure: ESOPHAGOGASTRODUODENOSCOPY (EGD);  Surgeon: Scot Jun, MD;  Location: [facility] ENDOSCOPY;  Service: Endoscopy;  Laterality: N/A;  . ESOPHAGOGASTRODUODENOSCOPY N/A 04/17/2015   Procedure: ESOPHAGOGASTRODUODENOSCOPY (EGD);  Surgeon: Scot Jun, MD;  Location: [facility] ENDOSCOPY;  Service: Endoscopy;  Laterality: N/A;  . ESOPHAGOGASTRODUODENOSCOPY N/A 08/02/2015   Procedure: ESOPHAGOGASTRODUODENOSCOPY (EGD);  Surgeon: Wallace Cullens, MD;  Location: [facility] ENDOSCOPY;  Service: Endoscopy;  Laterality: N/A;  . HERNIA REPAIR    . JOINT REPLACEMENT    . TOTAL HIP ARTHROPLASTY    . VALVE REPLACEMENT  1/02   Aortic; echo 3/09 valve working well; echo 10/11 working well; put on Coumadin    Social History   Tobacco Use  . Smoking status: Former Smoker    Types: Cigarettes    Quit date: 1979    Years since quitting: 42.2  . Smokeless tobacco: Never Used  Substance Use Topics  . Alcohol use: Yes    Alcohol/week: 3.0 standard drinks    Types: 3 Glasses of wine per week    Comment: Social  . Drug use: No    Family History  Problem Relation Age of Onset  . Hypertension Mother   . Diabetes type II Mother   . Heart disease Mother   . Heart attack Mother   . Hypertension Father   . Heart disease Father   . Stroke Father   . Stroke Brother   . Stroke Brother   . Hypertension Other      Allergies  Allergen Reactions  . Levofloxacin Nausea Only  . Sulfa Antibiotics Other (See Comments)    Reaction:  Unknown      REVIEW OF SYSTEMS (Negative unless checked)  Constitutional: [] Weight loss  [] Fever  [] Chills Cardiac: [] Chest pain    [] Chest pressure   [x] Palpitations   [] Shortness of breath when laying flat   [] Shortness of breath at rest   [x] Shortness of breath with exertion. Vascular:  [] Pain in legs with walking   [] Pain in legs at rest   [] Pain in legs when laying flat   [] Claudication   [] Pain in feet when walking  [] Pain in feet at rest  [] Pain in feet when laying flat   []   History of DVT   [] Phlebitis   [x] Swelling in legs   [x] Varicose veins   [] Non-healing ulcers Pulmonary:   [] Uses home oxygen   [] Productive cough   [] Hemoptysis   [] Wheeze  [] COPD   [] Asthma Neurologic:  [] Dizziness  [] Blackouts   [] Seizures   [] History of stroke   [] History of TIA  [] Aphasia   [] Temporary blindness   [] Dysphagia   [] Weakness or numbness in arms   [] Weakness or numbness in legs Musculoskeletal:  [x] Arthritis   [] Joint swelling   [] Joint pain   [] Low back pain Hematologic:  [] Easy bruising  [] Easy bleeding   [] Hypercoagulable state   [] Anemic   Gastrointestinal:  [] Blood in stool   [] Vomiting blood  [] Gastroesophageal reflux/heartburn   [] Abdominal pain Genitourinary:  [] Chronic kidney disease   [] Difficult urination  [] Frequent urination  [] Burning with urination   [] Hematuria Skin:  [] Rashes   [x] Ulcers   [x] Wounds Psychological:  [] History of anxiety   []  History of major depression.  Physical Examination  BP (!) 140/59   Pulse 70   Ht 5\' 11"  (1.803 m)   Wt 209 lb (94.8 kg)   BMI 29.15 kg/m  Gen:  WD/WN, NAD.  Appears younger than stated age Head: [place]/AT, No temporalis wasting. Ear/Nose/Throat: Hearing grossly intact, nares w/o erythema or drainage Eyes: Conjunctiva clear. Sclera non-icteric Neck: Supple.  Trachea midline Pulmonary:  Good air movement, no use of accessory muscles.  Cardiac: Irregular Vascular:  Vessel Right Left  Radial Palpable Palpable                       Musculoskeletal: M/S 5/5 throughout.  No deformity or atrophy.  Trace right lower extremity edema, 1+ left lower extremity edema with wound  currently wrapped in an . Neurologic: Sensation grossly intact in extremities.  Symmetrical.  Speech is fluent.  Psychiatric: Judgment intact, Mood & affect appropriate for pt's clinical situation. Dermatologic: Left lower leg wound currently dressed in an Recent Results (from the past 2160 hour(s))  POCT INR     Status: Abnormal   Collection Time: 11/29/19  3:09 PM  Result Value Ref Range   INR 3.8 (A) 2.0 - 3.0  POCT INR     Status: Abnormal   Collection Time: 12/20/19 11:24 AM  Result Value Ref Range   INR 4.0 (A) 2.0 - 3.0  POCT INR     Status: Abnormal   Collection Time: 01/10/20 12:54 PM  Result Value Ref Range   INR 4.7 (A) 2.0 - 3.0  Basic metabolic panel     Status: Abnormal   Collection Time: 01/10/20  1:38 PM  Result Value Ref Range   Sodium 136 135 - 145 mmol/L   Potassium 3.2 (L) 3.5 - 5.1 mmol/L   Chloride 89 (L) 98 - 111 mmol/L   CO2 33 (H) 22 - 32 mmol/L   Glucose, Bld 99 70 - 99 mg/dL   BUN 46 (H) 8 - 23 mg/dL   Creatinine, Ser (H) 0.61 - 1.24 mg/dL   Calcium 9.0 8.9 - mg/dL   GFR calc non Af Amer 44 (L) >60 mL/min   GFR calc Af Amer 51 (L) >60 mL/min   Anion gap 14 5 - 15    Comment: Performed at [facility], [address] [place],   Basic metabolic panel     Status: Abnormal   Collection Time: 01/18/20  1:35  PM  Result Value Ref Range   Sodium 135 135 - 145 mmol/L   Potassium 3.5 3.5 - 5.1 mmol/L   Chloride 92 (L) 98 - 111 mmol/L   CO2 30 22 - 32 mmol/L   Glucose, Bld 119 (H) 70 - 99 mg/dL    Comment: Glucose reference range applies only to samples taken after fasting for at least 8 hours.   BUN 43 (H) 8 - 23 mg/dL   Creatinine, Ser 1.23 0.61 - 1.24 mg/dL   Calcium 8.7 (L) 8.9 - 10.3 mg/dL   GFR calc non Af Amer 53 (L) >60 mL/min   GFR calc Af Amer >60 >60 mL/min   Anion gap 13 5 - 15    Comment: Performed at [facility], [address] [place], [place] [address]   POCT INR     Status: None   Collection Time: 01/24/20 10:42 AM  Result Value Ref Range   INR 2.5 2.0 - 3.0  Basic metabolic panel     Status: Abnormal   Collection Time: 02/03/20 10:51 AM  Result Value Ref Range   Sodium 134 (L) 135 - 145 mmol/L   Potassium 3.6 3.5 - 5.1 mmol/L   Chloride 94 (L) 98 - 111 mmol/L   CO2 30 22 - 32 mmol/L   Glucose, Bld 98 70 - 99 mg/dL    Comment: Glucose reference range applies only to samples taken after fasting for at least 8 hours.   BUN 34 (H) 8 - 23 mg/dL   Creatinine, Ser 1.10 0.61 - 1.24 mg/dL   Calcium 8.7 (L) 8.9 - 10.3 mg/dL   GFR calc non Af Amer >60 >60 mL/min   GFR calc Af Amer >60 >60 mL/min   Anion gap 10 5 - 15    Comment: Performed at [facility], [address] [place], [place] [address]    Radiology No results found.  Assessment/Plan  Hypertension blood pressure control important in reducing the progression of atherosclerotic disease. On appropriate oral medications.   Lower limb ulcer, ankle, left, limited to breakdown of skin ([place]) Three layer UNNA boot replaced on the left leg today.   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   Varicose veins of leg with swelling, right Right GSV has significant reflux.  He says his symptoms are well enough controlled with stockings that he does not want EVLT on the right at this time, which is reasonable.    Leotis Pain, MD  02/08/2020 2:16 PM    This note was created with Dragon medical transcription system.  Any errors from dictation are purely unintentional

## 2020-02-08 NOTE — Assessment & Plan Note (Signed)
Right GSV has significant reflux.  He says his symptoms are well enough controlled with stockings that he does not want EVLT on the right at this time, which is reasonable.

## 2020-02-08 NOTE — Patient Instructions (Signed)
Nonsurgical Procedures for Varicose Veins Various nonsurgical procedures can be used to treat varicose veins. Varicose veins are swollen, twisted veins that are visible under the skin. They occur most often in the legs. These veins may appear blue and bulging. Varicose veins are caused by damage to the valves in veins. All veins have a valve that makes blood flow in only one direction. If a valve gets weak or damaged, blood can pool and cause varicose veins. You may need a procedure to treat your varicose veins if they are causing symptoms or complications, or if lifestyle changes have not helped. These procedures can reduce pain, aching, and the risk of bleeding and blood clots. They can also improve the way the affected area looks (cosmetic appearance). The three common nonsurgical procedures are:  Sclerotherapy. A chemical is injected to close off a vein.  Laser treatment. Light energy is applied to close off the vein.  Radiofrequency vein ablation. Electrical energy is used to produce heat that closes off the vein. Your health care provider will discuss the method that is best for you based on your condition. Tell a health care provider about:  Any allergies you have.  All medicines you are taking, including vitamins, herbs, eye drops, creams, and over-the-counter medicines.  Any problems you or family members have had with anesthetic medicines.  Any blood disorders you have.  Any surgeries you have had.  Any medical conditions you have.  Whether you are pregnant or may be pregnant. What are the risks? Generally, this is a safe procedure. However, problems may occur, including:  Damage to nearby nerves, tissues, or veins.  Skin irritation, sores, or dark spots.  Numbness.  Clotting.  Infection.  Allergic reactions to medicines.  Scarring.  Leg swelling.  Need for additional treatments.  Bruising. What happens before the procedure?  Ask your health care provider  about: ? Changing or stopping your regular medicines. This is especially important if you are taking diabetes medicines or blood thinners. ? Taking over-the-counter medicines, vitamins, herbs, and supplements. ? Taking medicines such as aspirin and ibuprofen. These medicines can thin your blood. Do not take these medicines unless your health care provider tells you to take them.  You may have an exam or testing. This can include a tests to: ? Check for clots and check blood flow using sound waves (Doppler ultrasound). ? Observe how blood flows through your veins by injecting a dye that outlines your veins on X-rays (angiogram). This test is used in rare cases. What happens during the procedure? One of the following procedures will be performed: Sclerotherapy This procedure is often used for small to medium veins.  A chemical (sclerosant) that irritates the lining of the vein will be injected into the vein. This will cause the varicose vein to be closed off. Sclerosants in different amounts and strengths can be used, depending on the size and location of the vein.  All of the varicose vein sites will be injected. You may need more than one treatment because new varicose veins may develop, or more than one injection may be needed for each varicose vein.  Laser treatment There are two ways that lasers are used to treat varicose veins:  Light energy from a laser may be directed onto the vein through the skin.  A needle may be used to pass a thin laser catheter into the vein to cause it to close. You may need more than one treatment if the vein re-opens. In some cases,   laser treatment may be combined with sclerotherapy. Radiofrequency vein ablation   You will be given a medicine that numbs the area (local anesthetic).  A small incision will be made near the varicose vein.  A thin tube (catheter) will be threaded into your vein.  The tip of the catheter will deploy electrodes.  The  electrodes will deliver electrical energy to produce heat that closes off the vein. What happens after the procedure?  A bandage (dressing) may be used to cover the injection site or incisions.  You may have to wear compression stockings. These stockings help to prevent blood clots and reduce swelling in your legs.  Return to your normal activities as told by your health care provider. Summary  Varicose veins are swollen, twisted veins that are visible under the skin. They occur most often in the legs.  Various procedures can be used to treat varicose veins. You may need a procedure to treat your varicose veins if they are causing symptoms or complications, or if lifestyle changes have not helped.  Your health care provider will discuss the method that is best for you based on your condition. This information is not intended to replace advice given to you by your health care provider. Make sure you discuss any questions you have with your health care provider. Document Revised: 03/05/2019 Document Reviewed: 02/21/2017 Elsevier Patient Education  2020 [employer]  

## 2020-02-09 ENCOUNTER — Ambulatory Visit (INDEPENDENT_AMBULATORY_CARE_PROVIDER_SITE_OTHER): Payer: Medicare Other | Admitting: Nurse Practitioner

## 2020-02-11 ENCOUNTER — Other Ambulatory Visit: Payer: Self-pay

## 2020-02-11 ENCOUNTER — Ambulatory Visit (INDEPENDENT_AMBULATORY_CARE_PROVIDER_SITE_OTHER): Payer: Medicare Other

## 2020-02-11 DIAGNOSIS — R0600 Dyspnea, unspecified: Secondary | ICD-10-CM | POA: Diagnosis not present

## 2020-02-14 ENCOUNTER — Other Ambulatory Visit: Payer: Self-pay

## 2020-02-14 ENCOUNTER — Ambulatory Visit (INDEPENDENT_AMBULATORY_CARE_PROVIDER_SITE_OTHER): Payer: Medicare Other

## 2020-02-14 ENCOUNTER — Telehealth: Payer: Self-pay

## 2020-02-14 DIAGNOSIS — Z7901 Long term (current) use of anticoagulants: Secondary | ICD-10-CM

## 2020-02-14 DIAGNOSIS — I350 Nonrheumatic aortic (valve) stenosis: Secondary | ICD-10-CM

## 2020-02-14 DIAGNOSIS — Z952 Presence of prosthetic heart valve: Secondary | ICD-10-CM | POA: Diagnosis not present

## 2020-02-14 DIAGNOSIS — Z5181 Encounter for therapeutic drug level monitoring: Secondary | ICD-10-CM | POA: Diagnosis not present

## 2020-02-14 LAB — POCT INR: INR: 2.6 (ref 2.0–3.0)

## 2020-02-14 NOTE — Telephone Encounter (Signed)
-----   Message from Creig Hines, NP sent at 02/11/2020  5:35 PM EDT ----- Normal heart squeezing function.  The top chambers are very dilated, which is very common in atrial fibrillation.  The mitral and pulmonic valves are mildly leaky, while the tricuspid valve is moderately to severely leaky - this is often associated with higher pressures on the right side of the heart, though we weren't able to accurately measure any right heart pressures.  The mechanical aortic valve continues to work normally.  With the exception of some advance in the degree of leakiness of the tricuspid valve, this study is fairly similar to prior.

## 2020-02-14 NOTE — Patient Instructions (Signed)
-   Continue warfarin dosage of 1 tablet every day.  - Recheck INR in 4 weeks.

## 2020-02-14 NOTE — Telephone Encounter (Signed)
Call attempted, mailbox full.   1st attempt.

## 2020-02-16 ENCOUNTER — Ambulatory Visit (INDEPENDENT_AMBULATORY_CARE_PROVIDER_SITE_OTHER): Payer: Medicare Other | Admitting: Cardiovascular Disease

## 2020-02-16 ENCOUNTER — Encounter: Payer: Self-pay | Admitting: Cardiovascular Disease

## 2020-02-16 ENCOUNTER — Other Ambulatory Visit: Payer: Self-pay | Admitting: Cardiovascular Disease

## 2020-02-16 ENCOUNTER — Other Ambulatory Visit: Payer: Self-pay

## 2020-02-16 ENCOUNTER — Ambulatory Visit (INDEPENDENT_AMBULATORY_CARE_PROVIDER_SITE_OTHER): Payer: Medicare Other | Admitting: Nurse Practitioner

## 2020-02-16 VITALS — BP 120/62 | HR 53 | Ht 71.0 in | Wt 206.2 lb

## 2020-02-16 VITALS — BP 139/68 | HR 66 | Ht 71.0 in | Wt 203.0 lb

## 2020-02-16 DIAGNOSIS — I25118 Atherosclerotic heart disease of native coronary artery with other forms of angina pectoris: Secondary | ICD-10-CM | POA: Diagnosis not present

## 2020-02-16 DIAGNOSIS — I350 Nonrheumatic aortic (valve) stenosis: Secondary | ICD-10-CM | POA: Diagnosis not present

## 2020-02-16 DIAGNOSIS — L97321 Non-pressure chronic ulcer of left ankle limited to breakdown of skin: Secondary | ICD-10-CM

## 2020-02-16 DIAGNOSIS — I5032 Chronic diastolic (congestive) heart failure: Secondary | ICD-10-CM

## 2020-02-16 DIAGNOSIS — Z952 Presence of prosthetic heart valve: Secondary | ICD-10-CM

## 2020-02-16 DIAGNOSIS — Z5181 Encounter for therapeutic drug level monitoring: Secondary | ICD-10-CM

## 2020-02-16 DIAGNOSIS — R0600 Dyspnea, unspecified: Secondary | ICD-10-CM

## 2020-02-16 MED ORDER — METOLAZONE 2.5 MG PO TABS: 2.5000 mg | ORAL_TABLET | ORAL | 3 refills | Status: AC

## 2020-02-16 MED ORDER — POTASSIUM CHLORIDE ER 10 MEQ PO TBCR: 30.0000 meq | EXTENDED_RELEASE_TABLET | Freq: Every day | ORAL | 3 refills | Status: DC

## 2020-02-16 NOTE — Progress Notes (Signed)
Cardiology Office Note  Date:  02/16/2020   ID:  [name], DOB 21-Jan-1931, MRN [id number]  PCP:  Baxter Hire, MD   Chief Complaint  Patient presents with  . other    6 week follow up. Meds reviewed by the pt. verbally. Pt. c/o LE edema.     HPI:  [name] is a 86 y.o. male with  h/o aortic stenosis  s/p St. Jude mechanical aortic valve replacement in 2002 on chronic Coumadin therapy,  mild CAD by cardiac cath in 2011 managed medically,  GERD,  HTN,  carotid bruits,  Chronic hyponatremia asymptomatic bradycardia,  RLS, anxiety, depression,  GI bleed OSA on CPAP osteoarthritis s/p total left hip replacement on 03/21/2015 who was discharged from [facility]/2 the above surgery with a hgb of 8 (baseline 12), he was started on po iron replacement therapy, who presented to on 04/02/15 with acute onset of weakness, melena, BRBPR, and was found to have a hgb of 5.1. Aspirin and warfarin initially held, restarted at a later date, S/p EGD Ejection fraction greater than 55% March 2018, moderately elevated right heart pressures He presents today for follow-up of his aortic valve replacement and hypertension, permanent atrial fibrillation, chronic diastolic CHF  In follow-up today, reports that he is doing relatively well Activity/walking limited by Bad knee on left,  Walks with a walker  Recently seen by vascular surgery, has wrap on left lower extremity for venous insufficiency and leg swelling Continues to take torsemide 40 BID Metolazone 2x a week  Labs reviewed CR 1.1, BUN 34, potassium 3.6  Leg swelling on right leg much improved, compression hose in place bilaterally  Echo 01/2020 reviewed 1. Left ventricular ejection fraction, by estimation, is 55 to 60%. The  left ventricle has normal function. The left ventricle has no regional  wall motion abnormalities. Left ventricular diastolic function could not  be evaluated.  2. Right ventricular systolic function is  normal. The right ventricular  size is mildly enlarged.  3. Left atrial size was severely dilated.  4. Right atrial size was severely dilated.  5. The mitral valve is degenerative. Mild mitral valve regurgitation.  6. Tricuspid valve regurgitation is moderate to severe.   Medication side effects reviewed  hydralazine secondary to side effects Clonidine was fatigue dry mouth Prior notes indicating amlodipine 10 mg caused leg swelling,   EKG personally reviewed by myself on todays visit Shows atrial fibrillation with ventricular rate 53 bpm  Other past medical history reviewed Prior echocardiogram April 2018 Previous Echocardiogram shows normal LV function, well-functioning aortic valve, Moderately elevated right heart pressures with severe TR    Past Surgical History:  Procedure Laterality Date  . CARDIAC CATHETERIZATION    . ESOPHAGOGASTRODUODENOSCOPY N/A 04/05/2015   Procedure: ESOPHAGOGASTRODUODENOSCOPY (EGD);  Surgeon: Manya Silvas, MD;  Location: [facility] ENDOSCOPY;  Service: Endoscopy;  Laterality: N/A;  . ESOPHAGOGASTRODUODENOSCOPY N/A 04/17/2015   Procedure: ESOPHAGOGASTRODUODENOSCOPY (EGD);  Surgeon: Manya Silvas, MD;  Location: [facility] ENDOSCOPY;  Service: Endoscopy;  Laterality: N/A;  . ESOPHAGOGASTRODUODENOSCOPY N/A 08/02/2015   Procedure: ESOPHAGOGASTRODUODENOSCOPY (EGD);  Surgeon: Hulen Luster, MD;  Location: [facility] ENDOSCOPY;  Service: Endoscopy;  Laterality: N/A;  . HERNIA REPAIR    . JOINT REPLACEMENT    . TOTAL HIP ARTHROPLASTY    . VALVE REPLACEMENT  1/02   Aortic; echo 3/09 valve working well; echo 10/11 working well; put on Coumadin    Current Outpatient Medications on File Prior to Visit  Medication Sig Dispense Refill  .  Cetirizine HCl 10 MG CAPS Take 10 mg by mouth daily.    . citalopram (CELEXA) 20 MG tablet Take 20 mg by mouth daily.    . cloNIDine (CATAPRES) 0.1 MG tablet TAKE 1 TABLET BY MOUTH  TWICE DAILY 180 tablet 3  . doxycycline (VIBRAMYCIN) 100  MG capsule Take 1 capsule (100 mg total) by mouth 2 (two) times daily. 14 capsule 0  . finasteride (PROSCAR) 5 MG tablet Take 5 mg by mouth daily.      . metolazone (ZAROXOLYN) 2.5 MG tablet Take 1 tablet (2.5 mg total) by mouth once a week. 12 tablet 1  . metoprolol succinate (TOPROL-XL) 25 MG 24 hr tablet Take 0.5 tablets (12.5 mg total) by mouth daily. 90 tablet 3  . potassium chloride (KLOR-CON) 10 MEQ tablet Take 3 tablets (30 mEq total) by mouth daily. Take an additional only on the days you take metolazone. 18 tablet 1  . ranitidine (ZANTAC) 150 MG tablet Take 150 mg by mouth as needed.    Marland Kitchen rOPINIRole (REQUIP) 2 MG tablet TAKE 1 TABLET BY MOUTH 4  TIMES DAILY 360 tablet 3  . Tamsulosin HCl (FLOMAX) 0.4 MG CAPS Take 0.4 mg by mouth at bedtime.     . torsemide (DEMADEX) 20 MG tablet Take TWO tablets by mouth TWICE a day. 270 tablet 3  . traMADol (ULTRAM) 50 MG tablet Take 50 mg by mouth 2 (two) times daily as needed.    . traZODone (DESYREL) 50 MG tablet Take 50 mg by mouth at bedtime.    Marland Kitchen warfarin (COUMADIN) 5 MG tablet Take 1 tablet daily or as directed by the Anticoagulation Clinic. 90 tablet 0   No current facility-administered medications on file prior to visit.    Allergies:   Levofloxacin and Sulfa antibiotics   Social History:  The patient  reports that he quit smoking about 42 years ago. His smoking use included cigarettes. He has never used smokeless tobacco. He reports current alcohol use of about 3.0 standard drinks of alcohol per week. He reports that he does not use drugs.   Family History:   family history includes Diabetes type II in his mother; Heart attack in his mother; Heart disease in his father and mother; Hypertension in his father, mother, and another family member; Stroke in his brother, brother, and father.    Review of Systems: Review of Systems  Constitutional: Negative.   Eyes: Negative.   Respiratory: Negative.   Cardiovascular: Positive for leg  swelling.  Gastrointestinal: Negative.   Genitourinary: Negative.   Musculoskeletal: Negative.   Neurological: Negative.   Psychiatric/Behavioral: Negative.   All other systems reviewed and are negative.   PHYSICAL EXAM: VS:  BP 120/62 (BP Location: Left Arm, Patient Position: Sitting, Cuff Size: Normal)   Pulse (!) 53   Ht 5\' 11"  (1.803 m)   Wt 206 lb 4 oz (93.6 kg)   SpO2 98%   BMI 28.77 kg/m  , BMI Body mass index is 28.77 kg/m.  Constitutional:  oriented to person, place, and time. No distress.  HENT:  Head: Grossly normal Eyes:  no discharge. No scleral icterus.  Neck: No JVD, no carotid bruits  Cardiovascular: Irregularly irregular,  no murmurs appreciated Pulmonary/Chest: Clear to auscultation bilaterally, no wheezes or rails Abdominal: Soft.  no distension.  no tenderness.  Musculoskeletal: Normal range of motion Neurological:  normal muscle tone. Coordination normal. No atrophy Skin: Skin warm and dry Psychiatric: normal affect, pleasant  Recent Labs: 02/03/2020: BUN  34; Creatinine, Ser 1.10; Potassium 3.6; Sodium 134   Lipid Panel No results found for: CHOL, HDL, LDLCALC, TRIG    Filed Weights   02/16/20 0927  Weight: 206 lb 4 oz (93.6 kg)    ASSESSMENT AND PLAN:  Acute on chronic diastolic CHF Weight is stable on his current medication regiment, appears relatively euvolemic Lab work reviewed, renal function stable Suggested he stay on torsemide 40 twice daily with metolazone 2.5 twice a week Medication prescriptions adjusted  Essential hypertension - Blood pressure is well controlled on today's visit. No changes made to the medications.  Coronary artery disease involving native coronary artery of native heart without angina pectoris Currently with no symptoms of angina. No further workup at this time. Continue current medication regimen.  Atrial flutter, typical/atrial fibrillation Previously declined cardioversion for atrial fibrillation, Adequate  rate control, tolerating anticoagulation on warfarin  S/P AVR (aortic valve replacement) Well-functioning valve on echocardiogram March 2021   SOB (shortness of breath) Stable, continue torsemide 40 twice daily  Lower extremity edema Wears compression hose, torsemide as above Edema stable, much improved Venous insufficiency affecting the left Reports he has been in discussion with vascular surgery for vein ablation on the left   Total encounter time more than 25 minutes  Greater than 50% was spent in counseling and coordination of care with the patient  Disposition:   F/U  2 month    Signed, Dossie Arbour, M.D., Ph.D. 02/16/2020  [facility] [place], [place] [phone]

## 2020-02-16 NOTE — Progress Notes (Signed)
History of Present Illness  There is no documented history at this time  Assessments & Plan   There are no diagnoses linked to this encounter.    Additional instructions  Subjective:  Patient presents with venous ulcer of the Left lower extremity.    Procedure:  3 layer unna wrap was placed Left lower extremity.   Plan:   Follow up in one week.  

## 2020-02-16 NOTE — Patient Instructions (Addendum)
Medication Instructions:  Metolazone 2x a week  Potassium 3x a day When you take Metolazone you will need to take 2 extra potassium   If you need a refill on your cardiac medications before your next appointment, please call your pharmacy.    Lab work: No new labs needed   If you have labs (blood work) drawn today and your tests are completely normal, you will receive your results only by: Marland Kitchen MyChart Message (if you have MyChart) OR . A paper copy in the mail If you have any lab test that is abnormal or we need to change your treatment, we will call you to review the results.   Testing/Procedures: No new testing needed   Follow-Up: At [facility], you and your health needs are our priority.  As part of our continuing mission to provide you with exceptional heart care, we have created designated Provider Care Teams.  These Care Teams include your primary Cardiologist (physician) and Advanced Practice Providers (APPs -  Physician Assistants and Nurse Practitioners) who all work together to provide you with the care you need, when you need it.  . You will need a follow up appointment in 6 months .  Marland Kitchen Providers on your designated Care Team:   . Nicolasa Ducking, NP . Eula Listen, PA-C . [name], PA-C  Any Other Special Instructions Will Be Listed Below (If Applicable).  For educational health videos Log in to : [email or web address] Or : [email or web address], password : [email or web address]

## 2020-02-17 ENCOUNTER — Telehealth: Payer: Self-pay

## 2020-02-17 MED ORDER — POTASSIUM CHLORIDE ER 10 MEQ PO TBCR: 30.0000 meq | EXTENDED_RELEASE_TABLET | Freq: Every day | ORAL | 3 refills | Status: DC

## 2020-02-17 NOTE — Telephone Encounter (Signed)
Attempted to call patient. LMTCB 02/17/2020   

## 2020-02-17 NOTE — Telephone Encounter (Signed)
Incoming call returned by patient.   He verbalized understanding of results. Pt did report some concern for worsening tricuspid valve. He discussed results with Dr. Mariah Milling at appt yesterday. There are no further orders at this time.   Advised pt to call for any further questions or concerns or new or worsening sx.

## 2020-02-18 NOTE — Telephone Encounter (Signed)
[facility] calling, needs to clarify prescription  per history patient takes KLOR-CON M and new prescription is just for KLOR-CON - do we wish to switch? Please call [phone] ref: [id number]

## 2020-02-18 NOTE — Telephone Encounter (Signed)
Returned call to [facility]. Okay to keep the rx the same.   No further questions or orders at this time.   Advised pt to call for any further questions or concerns.

## 2020-02-20 ENCOUNTER — Encounter (INDEPENDENT_AMBULATORY_CARE_PROVIDER_SITE_OTHER): Payer: Self-pay | Admitting: Nurse Practitioner

## 2020-02-21 ENCOUNTER — Telehealth: Payer: Self-pay | Admitting: Cardiovascular Disease

## 2020-02-21 NOTE — Telephone Encounter (Signed)
Please call to discuss his heart valve situation. Patient is confused of his diagnosis.

## 2020-02-22 NOTE — Telephone Encounter (Signed)
Spoke with patient and he was reviewing information from echocardiogram mentioning leaky valves. Reviewed report in detail and discussed that we will closely monitor this most likely with yearly echocardiograms. He does have scheduled follow up and recommended that if he should have any further questions prior, he can certainly give [place] a call back. He verbalized understanding with no further questions at this time.

## 2020-02-23 ENCOUNTER — Encounter (INDEPENDENT_AMBULATORY_CARE_PROVIDER_SITE_OTHER): Payer: Medicare Other

## 2020-02-23 ENCOUNTER — Other Ambulatory Visit: Payer: Self-pay

## 2020-02-23 ENCOUNTER — Encounter (INDEPENDENT_AMBULATORY_CARE_PROVIDER_SITE_OTHER): Payer: Self-pay | Admitting: Nurse Practitioner

## 2020-02-23 ENCOUNTER — Ambulatory Visit (INDEPENDENT_AMBULATORY_CARE_PROVIDER_SITE_OTHER): Payer: Medicare Other | Admitting: Nurse Practitioner

## 2020-02-23 VITALS — BP 138/80 | HR 70 | Ht 71.0 in | Wt 202.0 lb

## 2020-02-23 DIAGNOSIS — L97321 Non-pressure chronic ulcer of left ankle limited to breakdown of skin: Secondary | ICD-10-CM | POA: Diagnosis not present

## 2020-02-23 NOTE — Progress Notes (Signed)
History of Present Illness  There is no documented history at this time  Assessments & Plan   There are no diagnoses linked to this encounter.    Additional instructions  Subjective:  Patient presents with venous ulcer of the Left lower extremity.    Procedure:  3 layer unna wrap was placed Left lower extremity.   Plan:   Follow up in one week.  

## 2020-03-01 ENCOUNTER — Other Ambulatory Visit: Payer: Self-pay

## 2020-03-01 ENCOUNTER — Encounter (INDEPENDENT_AMBULATORY_CARE_PROVIDER_SITE_OTHER): Payer: Self-pay

## 2020-03-01 ENCOUNTER — Ambulatory Visit (INDEPENDENT_AMBULATORY_CARE_PROVIDER_SITE_OTHER): Payer: Medicare Other | Admitting: Nurse Practitioner

## 2020-03-01 VITALS — BP 131/63 | HR 84 | Resp 16 | Wt 204.0 lb

## 2020-03-01 DIAGNOSIS — L97321 Non-pressure chronic ulcer of left ankle limited to breakdown of skin: Secondary | ICD-10-CM

## 2020-03-01 NOTE — Progress Notes (Signed)
History of Present Illness  There is no documented history at this time  Assessments & Plan   There are no diagnoses linked to this encounter.    Additional instructions  Subjective:  Patient presents with venous ulcer of the Left lower extremity.    Procedure:  3 layer unna wrap was placed Left lower extremity.   Plan:   Follow up in one week.  

## 2020-03-06 ENCOUNTER — Encounter (INDEPENDENT_AMBULATORY_CARE_PROVIDER_SITE_OTHER): Payer: Self-pay | Admitting: Nurse Practitioner

## 2020-03-06 ENCOUNTER — Other Ambulatory Visit: Payer: Self-pay

## 2020-03-06 MED ORDER — METOPROLOL SUCCINATE ER 25 MG PO TB24: 12.5000 mg | ORAL_TABLET | Freq: Every day | ORAL | 3 refills | Status: AC

## 2020-03-08 ENCOUNTER — Ambulatory Visit (INDEPENDENT_AMBULATORY_CARE_PROVIDER_SITE_OTHER): Payer: Medicare Other | Admitting: Nurse Practitioner

## 2020-03-08 ENCOUNTER — Other Ambulatory Visit: Payer: Self-pay

## 2020-03-08 ENCOUNTER — Encounter (INDEPENDENT_AMBULATORY_CARE_PROVIDER_SITE_OTHER): Payer: Self-pay | Admitting: Nurse Practitioner

## 2020-03-08 VITALS — BP 136/70 | HR 69 | Wt 198.0 lb

## 2020-03-08 DIAGNOSIS — L97321 Non-pressure chronic ulcer of left ankle limited to breakdown of skin: Secondary | ICD-10-CM

## 2020-03-08 DIAGNOSIS — I83891 Varicose veins of right lower extremities with other complications: Secondary | ICD-10-CM | POA: Diagnosis not present

## 2020-03-13 ENCOUNTER — Ambulatory Visit (INDEPENDENT_AMBULATORY_CARE_PROVIDER_SITE_OTHER): Payer: Medicare Other

## 2020-03-13 ENCOUNTER — Other Ambulatory Visit: Payer: Self-pay

## 2020-03-13 ENCOUNTER — Encounter (INDEPENDENT_AMBULATORY_CARE_PROVIDER_SITE_OTHER): Payer: Self-pay | Admitting: Nurse Practitioner

## 2020-03-13 DIAGNOSIS — Z7901 Long term (current) use of anticoagulants: Secondary | ICD-10-CM | POA: Diagnosis not present

## 2020-03-13 DIAGNOSIS — I350 Nonrheumatic aortic (valve) stenosis: Secondary | ICD-10-CM | POA: Diagnosis not present

## 2020-03-13 DIAGNOSIS — Z952 Presence of prosthetic heart valve: Secondary | ICD-10-CM

## 2020-03-13 DIAGNOSIS — Z5181 Encounter for therapeutic drug level monitoring: Secondary | ICD-10-CM | POA: Diagnosis not present

## 2020-03-13 LAB — POCT INR: INR: 3.9 — AB (ref 2.0–3.0)

## 2020-03-13 NOTE — Patient Instructions (Signed)
skip warfarin today, then  - Continue warfarin dosage of 1 tablet every day.  - Recheck INR in 4 weeks.

## 2020-03-13 NOTE — Progress Notes (Signed)
Subjective:    Patient ID: [name], male    DOB: 1931-03-13, 86 y.o.   MRN: [id number] Chief Complaint  Patient presents with  . Follow-up    Unna boot check    Patient presents today for evaluation of left lower extremity due to ulceration.  Today the ulceration is getting smaller however it has been slow to heal.  The patient does have known reflux of his great saphenous vein.  The patient is actually scheduled to have an endovenous laser ablation in our office in the next several weeks.  Overall he has been handling his Unna wraps well.  He denies any fever, chills, nausea vomiting or diarrhea.   Review of Systems  Cardiovascular: Positive for leg swelling.  Musculoskeletal: Positive for gait problem.  Skin: Positive for wound.  All other systems reviewed and are negative.      Objective:   Physical Exam Vitals reviewed.  Constitutional:      Appearance: Normal appearance.  HENT:     Head: Normocephalic.  Cardiovascular:     Rate and Rhythm: Normal rate and regular rhythm.     Pulses: Normal pulses.  Skin:    Comments: Venous ulcer left medial ankle  Neurological:     Mental Status: He is alert and oriented to person, place, and time.  Psychiatric:        Mood and Affect: Mood normal.        Behavior: Behavior normal.        Thought Content: Thought content normal.        Judgment: Judgment normal.     BP 136/70   Pulse 69   Wt 198 lb (89.8 kg)   BMI 27.62 kg/m   Past Medical History:  Diagnosis Date  . Anxiety 10/11  . Aortic stenosis    a. s/p mechcanical AVR, 2002; b. 10/2018 Echo: Triv AI, mean grad .  . Bradycardia    chronic, no symptoms 07/2010  . C. difficile colitis   . Carotid bruit    dopplers in past, no abnormalities  . Chronic diastolic CHF (congestive heart failure) (HCC)    a. Echo 03/2015: EF 60-65%, no RWMA, GR1DD, mild BAE, mild to mod MR, mod TR, PASP 65 mmHg; b. 01/2017 Echo: EF 55-60%, NRWMA, grade 1 diastolic  dysfunction.  Normal functioning prosthetic aortic valve.  Mean gradient 50 mmHg.  Sev TR. PASP ; c. 10/2018 Echo: EF 55-60%, Triv AI, mod dil LA, mod-sev TR, PASP 35-40, mild to mod red RV fxn.  . Coronary artery disease    a. mild, cath, 08/2010; b. medically managed  . Decreased hearing    Right ear  . Depression   . Gastric ulcer   . GERD (gastroesophageal reflux disease)   . Hypertension    BP higher than usual 04/19/10; amlodipine increased by telephone  . Mod-Sev Tricuspid regurgitation    a. 10/2018 Echo: Mod-Sev TR, PASP 35-76mmHg.  Marland Kitchen RLS (restless legs syndrome) 08/23/2015  . S/P AVR    a. St. Jude. mechanical 2002; b. echo 08/2010 EF 60%, trival AI, mild MR, AVR working well; c. on longterm warfarin tx  . SOB (shortness of breath) 10/11   08/2010,Episodes at 5 AM, eventually felt to be anxiety, after complete workup including catheterization, pt greatly improved with anxiety meds 11/11    Social History   Socioeconomic History  . Marital status: Married    Spouse name: Not on file  . Number of children: 3  .  Years of education: some coll.  . Highest education level: Not on file  Occupational History  . Occupation: Retired    Associate Professor: RETIRED  Tobacco Use  . Smoking status: Former Smoker    Types: Cigarettes    Quit date: 1979    Years since quitting: 42.3  . Smokeless tobacco: Never Used  Substance and Sexual Activity  . Alcohol use: Yes    Alcohol/week: 3.0 standard drinks    Types: 3 Glasses of wine per week    Comment: Social  . Drug use: No  . Sexual activity: Never  Other Topics Concern  . Not on file  Social History Narrative   No regular exercise.   Patient lives at [facility]. His wife lives in same facility but she lives in the assisted living part.      Patient drinks 1-2 cups of caffeine daily.   Patient is right handed.   Social Determinants of Health   Financial Resource Strain:   . Difficulty of Paying Living Expenses:   Food  Insecurity:   . Worried About Programme researcher, broadcasting/film/video in the Last Year:   . Barista in the Last Year:   Transportation Needs:   . Freight forwarder (Medical):   Marland Kitchen Lack of Transportation (Non-Medical):   Physical Activity:   . Days of Exercise per Week:   . Minutes of Exercise per Session:   Stress:   . Feeling of Stress :   Social Connections:   . Frequency of Communication with Friends and Family:   . Frequency of Social Gatherings with Friends and Family:   . Attends Religious Services:   . Active Member of Clubs or Organizations:   . Attends Banker Meetings:   Marland Kitchen Marital Status:   Intimate Partner Violence:   . Fear of Current or Ex-Partner:   . Emotionally Abused:   Marland Kitchen Physically Abused:   . Sexually Abused:     Past Surgical History:  Procedure Laterality Date  . CARDIAC CATHETERIZATION    . ESOPHAGOGASTRODUODENOSCOPY N/A 04/05/2015   Procedure: ESOPHAGOGASTRODUODENOSCOPY (EGD);  Surgeon: Scot Jun, MD;  Location: [facility] ENDOSCOPY;  Service: Endoscopy;  Laterality: N/A;  . ESOPHAGOGASTRODUODENOSCOPY N/A 04/17/2015   Procedure: ESOPHAGOGASTRODUODENOSCOPY (EGD);  Surgeon: Scot Jun, MD;  Location: [facility] ENDOSCOPY;  Service: Endoscopy;  Laterality: N/A;  . ESOPHAGOGASTRODUODENOSCOPY N/A 08/02/2015   Procedure: ESOPHAGOGASTRODUODENOSCOPY (EGD);  Surgeon: Wallace Cullens, MD;  Location[facility] ENDOSCOPY;  Service: Endoscopy;  Laterality: N/A;  . HERNIA REPAIR    . JOINT REPLACEMENT    . TOTAL HIP ARTHROPLASTY    . VALVE REPLACEMENT  1/02   Aortic; echo 3/09 valve working well; echo 10/11 working well; put on Coumadin    Family History  Problem Relation Age of Onset  . Hypertension Mother   . Diabetes type II Mother   . Heart disease Mother   . Heart attack Mother   . Hypertension Father   . Heart disease Father   . Stroke Father   . Stroke Brother   . Stroke Brother   . Hypertension Other     Allergies  Allergen Reactions  . Levofloxacin  Nausea Only  . Sulfa Antibiotics Other (See Comments)    Reaction:  Unknown        Assessment & Plan:   1. Varicose veins of leg with swelling, right Patient is scheduled to have endovenous laser ablation in office for treatment of his varicose veins.  2. Lower limb  ulcer, ankle, left, limited to breakdown of skin [facility]) Today the patient has been handling his Unna wraps well and the patient has had some progression with wound healing.  We will plan on continue to have the patient come to the office on a weekly basis for Unna wrap changes for the next 2 weeks.  The patient will then have his endovenous laser ablation at which point we can place Unna wraps back in place.   Current Outpatient Medications on File Prior to Visit  Medication Sig Dispense Refill  . predniSONE (DELTASONE) 10 MG tablet 6 pills x 1 day, 5 pills x 1 day, 4 pills x 1 day, 3 pills x 1 day, 2 pills x 1 day. 1 pill x 1 day.    . Cetirizine HCl 10 MG CAPS Take 10 mg by mouth daily.    . citalopram (CELEXA) 20 MG tablet Take 20 mg by mouth daily.    . cloNIDine (CATAPRES) 0.1 MG tablet TAKE 1 TABLET BY MOUTH  TWICE DAILY 180 tablet 3  . doxazosin (CARDURA) 8 MG tablet     . doxycycline (VIBRAMYCIN) 100 MG capsule Take 1 capsule (100 mg total) by mouth 2 (two) times daily. (Patient not taking: Reported on 03/01/2020) 14 capsule 0  . finasteride (PROSCAR) 5 MG tablet Take 5 mg by mouth daily.      . isosorbide mononitrate (IMDUR) 60 MG 24 hr tablet TAKE 1 TABLET (60 MG TOTAL) BY MOUTH 2 (TWO) TIMES DAILY. 60 tablet 0  . metolazone (ZAROXOLYN) 2.5 MG tablet Take 1 tablet (2.5 mg total) by mouth 2 (two) times a week. 72 tablet 3  . metoprolol succinate (TOPROL-XL) 25 MG 24 hr tablet Take 0.5 tablets (12.5 mg total) by mouth daily. 90 tablet 3  . potassium chloride (KLOR-CON) 10 MEQ tablet Take 3 tablets (30 mEq total) by mouth daily. Take an additional 2 tablets on the days you take metolazone. 318 tablet 3  . ranitidine  (ZANTAC) 150 MG tablet Take 150 mg by mouth as needed.    Marland Kitchen rOPINIRole (REQUIP) 2 MG tablet TAKE 1 TABLET BY MOUTH 4  TIMES DAILY 360 tablet 3  . Tamsulosin HCl (FLOMAX) 0.4 MG CAPS Take 0.4 mg by mouth at bedtime.     . torsemide (DEMADEX) 20 MG tablet Take TWO tablets by mouth TWICE a day. 270 tablet 3  . traMADol (ULTRAM) 50 MG tablet Take 50 mg by mouth 2 (two) times daily as needed.    . traZODone (DESYREL) 50 MG tablet Take 50 mg by mouth at bedtime.    Marland Kitchen warfarin (COUMADIN) 5 MG tablet Take 1 tablet daily or as directed by the Anticoagulation Clinic. 90 tablet 0   No current facility-administered medications on file prior to visit.    There are no Patient Instructions on file for this visit. No follow-ups on file.   Kris Hartmann, NP

## 2020-03-15 ENCOUNTER — Other Ambulatory Visit: Payer: Self-pay

## 2020-03-15 ENCOUNTER — Encounter (INDEPENDENT_AMBULATORY_CARE_PROVIDER_SITE_OTHER): Payer: Self-pay | Admitting: Nurse Practitioner

## 2020-03-15 ENCOUNTER — Ambulatory Visit (INDEPENDENT_AMBULATORY_CARE_PROVIDER_SITE_OTHER): Payer: Medicare Other | Admitting: Nurse Practitioner

## 2020-03-15 VITALS — BP 123/77 | HR 76 | Ht 71.0 in | Wt 202.0 lb

## 2020-03-15 DIAGNOSIS — L97321 Non-pressure chronic ulcer of left ankle limited to breakdown of skin: Secondary | ICD-10-CM

## 2020-03-15 NOTE — Progress Notes (Signed)
History of Present Illness  There is no documented history at this time  Assessments & Plan   There are no diagnoses linked to this encounter.    Additional instructions  Subjective:  Patient presents with venous ulcer of the Right lower extremity.    Procedure:  3 layer unna wrap was placed Right lower extremity.   Plan:   Follow up in one week.   

## 2020-03-22 ENCOUNTER — Other Ambulatory Visit: Payer: Self-pay

## 2020-03-22 ENCOUNTER — Ambulatory Visit (INDEPENDENT_AMBULATORY_CARE_PROVIDER_SITE_OTHER): Payer: Medicare Other | Admitting: Nurse Practitioner

## 2020-03-22 ENCOUNTER — Encounter (INDEPENDENT_AMBULATORY_CARE_PROVIDER_SITE_OTHER): Payer: Self-pay

## 2020-03-22 VITALS — BP 111/63 | HR 66 | Resp 16 | Wt 200.0 lb

## 2020-03-22 DIAGNOSIS — L97321 Non-pressure chronic ulcer of left ankle limited to breakdown of skin: Secondary | ICD-10-CM | POA: Diagnosis not present

## 2020-03-22 NOTE — Progress Notes (Signed)
History of Present Illness  There is no documented history at this time  Assessments & Plan   There are no diagnoses linked to this encounter.    Additional instructions  Subjective:  Patient presents with venous ulcer of the Left lower extremity.    Procedure:  3 layer unna wrap was placed Left lower extremity.   Plan:   Follow up in one week.  

## 2020-03-23 ENCOUNTER — Telehealth: Payer: Self-pay | Admitting: Cardiovascular Disease

## 2020-03-23 NOTE — Telephone Encounter (Signed)
Patient would like to know if cherry juice will interfere with his Warfarin. Please advise.

## 2020-03-23 NOTE — Telephone Encounter (Signed)
Spoke with patient and advised that cherry juice will not interfere with warfarin. He should avoid cranberry and pomegranate and greentea however. Patient appreciative of the call.

## 2020-03-24 ENCOUNTER — Encounter (INDEPENDENT_AMBULATORY_CARE_PROVIDER_SITE_OTHER): Payer: Self-pay | Admitting: Nurse Practitioner

## 2020-03-31 ENCOUNTER — Other Ambulatory Visit (INDEPENDENT_AMBULATORY_CARE_PROVIDER_SITE_OTHER): Payer: Self-pay | Admitting: Vascular Surgery

## 2020-03-31 ENCOUNTER — Ambulatory Visit (INDEPENDENT_AMBULATORY_CARE_PROVIDER_SITE_OTHER): Payer: Medicare Other | Admitting: Vascular Surgery

## 2020-03-31 ENCOUNTER — Other Ambulatory Visit: Payer: Self-pay

## 2020-03-31 VITALS — BP 110/68 | HR 65 | Ht 71.0 in | Wt 203.0 lb

## 2020-03-31 DIAGNOSIS — Z9889 Other specified postprocedural states: Secondary | ICD-10-CM

## 2020-03-31 DIAGNOSIS — L97222 Non-pressure chronic ulcer of left calf with fat layer exposed: Secondary | ICD-10-CM | POA: Diagnosis not present

## 2020-03-31 DIAGNOSIS — I83022 Varicose veins of left lower extremity with ulcer of calf: Secondary | ICD-10-CM

## 2020-03-31 NOTE — Progress Notes (Signed)
[name] is a 86 y.o. male who presents with symptomatic venous reflux  Past Medical History:  Diagnosis Date  . Anxiety 10/11  . Aortic stenosis    a. s/p mechcanical AVR, 2002; b. 10/2018 Echo: Triv AI, mean grad 48mmHg.  . Bradycardia    chronic, no symptoms 07/2010  . C. difficile colitis   . Carotid bruit    dopplers in past, no abnormalities  . Chronic diastolic CHF (congestive heart failure) ([place])    a. Echo 03/2015: EF 60-65%, no RWMA, GR1DD, mild BAE, mild to mod MR, mod TR, PASP 65 mmHg; b. 01/2017 Echo: EF 55-60%, NRWMA, grade 1 diastolic dysfunction.  Normal functioning prosthetic aortic valve.  Mean gradient 50 mmHg.  Sev TR. PASP 29mmHg; c. 10/2018 Echo: EF 55-60%, Triv AI, mod dil LA, mod-sev TR, PASP 35-40, mild to mod red RV fxn.  . Coronary artery disease    a. mild, cath, 08/2010; b. medically managed  . Decreased hearing    Right ear  . Depression   . Gastric ulcer   . GERD (gastroesophageal reflux disease)   . Hypertension    BP higher than usual 04/19/10; amlodipine increased by telephone  . Mod-Sev Tricuspid regurgitation    a. 10/2018 Echo: Mod-Sev TR, PASP 35-79mmHg.  Marland Kitchen RLS (restless legs syndrome) 08/23/2015  . S/P AVR    a. St. Jude. mechanical 2002; b. echo 08/2010 EF 60%, trival AI, mild MR, AVR working well; c. on longterm warfarin tx  . SOB (shortness of breath) 10/11   08/2010,Episodes at 5 AM, eventually felt to be anxiety, after complete workup including catheterization, pt greatly improved with anxiety meds 11/11    Past Surgical History:  Procedure Laterality Date  . CARDIAC CATHETERIZATION    . ESOPHAGOGASTRODUODENOSCOPY N/A 04/05/2015   Procedure: ESOPHAGOGASTRODUODENOSCOPY (EGD);  Surgeon: Manya Silvas, MD;  Location: [facility] ENDOSCOPY;  Service: Endoscopy;  Laterality: N/A;  . ESOPHAGOGASTRODUODENOSCOPY N/A 04/17/2015   Procedure: ESOPHAGOGASTRODUODENOSCOPY (EGD);  Surgeon: Manya Silvas, MD;  Location: [facility] ENDOSCOPY;  Service:  Endoscopy;  Laterality: N/A;  . ESOPHAGOGASTRODUODENOSCOPY N/A 08/02/2015   Procedure: ESOPHAGOGASTRODUODENOSCOPY (EGD);  Surgeon: Hulen Luster, MD;  Location: [facility] ENDOSCOPY;  Service: Endoscopy;  Laterality: N/A;  . HERNIA REPAIR    . JOINT REPLACEMENT    . TOTAL HIP ARTHROPLASTY    . VALVE REPLACEMENT  1/02   Aortic; echo 3/09 valve working well; echo 10/11 working well; put on Coumadin     Current Outpatient Medications:  .  Cetirizine HCl 10 MG CAPS, Take 10 mg by mouth daily., Disp: , Rfl:  .  citalopram (CELEXA) 20 MG tablet, Take 20 mg by mouth daily., Disp: , Rfl:  .  cloNIDine (CATAPRES) 0.1 MG tablet, TAKE 1 TABLET BY MOUTH  TWICE DAILY, Disp: 180 tablet, Rfl: 3 .  doxazosin (CARDURA) 8 MG tablet, , Disp: , Rfl:  .  doxycycline (VIBRAMYCIN) 100 MG capsule, Take 1 capsule (100 mg total) by mouth 2 (two) times daily., Disp: 14 capsule, Rfl: 0 .  finasteride (PROSCAR) 5 MG tablet, Take 5 mg by mouth daily.  , Disp: , Rfl:  .  isosorbide mononitrate (IMDUR) 60 MG 24 hr tablet, TAKE 1 TABLET (60 MG TOTAL) BY MOUTH 2 (TWO) TIMES DAILY., Disp: 60 tablet, Rfl: 0 .  metolazone (ZAROXOLYN) 2.5 MG tablet, Take 1 tablet (2.5 mg total) by mouth 2 (two) times a week., Disp: 72 tablet, Rfl: 3 .  metoprolol succinate (TOPROL-XL) 25 MG 24 hr tablet,  Take 0.5 tablets (12.5 mg total) by mouth daily., Disp: 90 tablet, Rfl: 3 .  potassium chloride (KLOR-CON) 10 MEQ tablet, Take 3 tablets (30 mEq total) by mouth daily. Take an additional 2 tablets on the days you take metolazone., Disp: 318 tablet, Rfl: 3 .  predniSONE (DELTASONE) 10 MG tablet, 6 pills x 1 day, 5 pills x 1 day, 4 pills x 1 day, 3 pills x 1 day, 2 pills x 1 day. 1 pill x 1 day., Disp: , Rfl:  .  ranitidine (ZANTAC) 150 MG tablet, Take 150 mg by mouth as needed., Disp: , Rfl:  .  rOPINIRole (REQUIP) 2 MG tablet, TAKE 1 TABLET BY MOUTH 4  TIMES DAILY, Disp: 360 tablet, Rfl: 3 .  Tamsulosin HCl (FLOMAX) 0.4 MG CAPS, Take 0.4 mg by mouth at  bedtime. , Disp: , Rfl:  .  torsemide (DEMADEX) 20 MG tablet, Take TWO tablets by mouth TWICE a day., Disp: 270 tablet, Rfl: 3 .  traMADol (ULTRAM) 50 MG tablet, Take 50 mg by mouth 2 (two) times daily as needed., Disp: , Rfl:  .  traZODone (DESYREL) 50 MG tablet, Take 50 mg by mouth at bedtime., Disp: , Rfl:  .  warfarin (COUMADIN) 5 MG tablet, Take 1 tablet daily or as directed by the Anticoagulation Clinic., Disp: 90 tablet, Rfl: 0  Allergies  Allergen Reactions  . Levofloxacin Nausea Only  . 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 The ultrasound machine was used to visualize the saphenous vein throughout its course. A segment in the upper calf was selected for access. The saphenous vein was accessed without difficulty using ultrasound guidance with a micropuncture needle. A 0.018 wire was then placed beyond the saphenofemoral junction and the needle was removed. The 65 cm sheath was then placed over the wire and the wire and dilator were removed. The laser fiber was then placed through the sheath and its tip was placed approximately 5 centimeters below the saphenofemoral junction. Tumescent anesthesia was then created with a dilute lidocaine solution. Laser energy was then delivered with constant withdrawal of the sheath and laser fiber. Approximately 1305 joules of energy were delivered over a length of 34 centimeters using a 1470 Hz VenaCure machine at 7 W. Sterile dressings were placed. The patient tolerated the procedure well without obvious complications.   Follow-up in 1 week with post-laser duplex.

## 2020-04-03 ENCOUNTER — Other Ambulatory Visit: Payer: Self-pay

## 2020-04-03 ENCOUNTER — Ambulatory Visit (INDEPENDENT_AMBULATORY_CARE_PROVIDER_SITE_OTHER): Payer: Medicare Other

## 2020-04-03 ENCOUNTER — Encounter (INDEPENDENT_AMBULATORY_CARE_PROVIDER_SITE_OTHER): Payer: Medicare Other

## 2020-04-03 DIAGNOSIS — M1A00X0 Idiopathic chronic gout, unspecified site, without tophus (tophi): Secondary | ICD-10-CM | POA: Insufficient documentation

## 2020-04-03 DIAGNOSIS — Z9889 Other specified postprocedural states: Secondary | ICD-10-CM | POA: Diagnosis not present

## 2020-04-05 ENCOUNTER — Other Ambulatory Visit: Payer: Self-pay | Admitting: Cardiovascular Disease

## 2020-04-06 ENCOUNTER — Telehealth (INDEPENDENT_AMBULATORY_CARE_PROVIDER_SITE_OTHER): Payer: Self-pay

## 2020-04-06 NOTE — Telephone Encounter (Signed)
Patient called wanting to know how long will it take for his leg to heal, I advised that it could take up to a month to heal due to everyone heals differently. Patient wanted to know what was the next step in his care, I advised that his next appt is on 05/01/20 at 11:00 and Sheppard Plumber NP will discuss his care at this time.

## 2020-04-10 ENCOUNTER — Other Ambulatory Visit: Payer: Self-pay

## 2020-04-10 ENCOUNTER — Ambulatory Visit (INDEPENDENT_AMBULATORY_CARE_PROVIDER_SITE_OTHER): Payer: Medicare Other

## 2020-04-10 DIAGNOSIS — Z7901 Long term (current) use of anticoagulants: Secondary | ICD-10-CM

## 2020-04-10 DIAGNOSIS — Z5181 Encounter for therapeutic drug level monitoring: Secondary | ICD-10-CM

## 2020-04-10 DIAGNOSIS — Z952 Presence of prosthetic heart valve: Secondary | ICD-10-CM | POA: Diagnosis not present

## 2020-04-10 DIAGNOSIS — I350 Nonrheumatic aortic (valve) stenosis: Secondary | ICD-10-CM | POA: Diagnosis not present

## 2020-04-10 LAB — POCT INR: INR: 3.3 — AB (ref 2.0–3.0)

## 2020-04-10 NOTE — Patient Instructions (Signed)
-   Continue warfarin dosage of 1 tablet every day.  - Recheck INR in 5 weeks.

## 2020-04-11 ENCOUNTER — Other Ambulatory Visit: Payer: Self-pay | Admitting: Cardiovascular Disease

## 2020-04-12 NOTE — Telephone Encounter (Signed)
Refill request for DOXAZOSIN 8MG  Verified w/ patient if currently taking DOXAZOSIN 8MG  Patient is still taking medication Last refilled from a Historical Provider  DOXAZOSIN 8MG  is not listed in the last OV note  Please advise if medication can be refilled Thank you

## 2020-04-15 ENCOUNTER — Other Ambulatory Visit: Payer: Self-pay | Admitting: Cardiovascular Disease

## 2020-04-17 NOTE — Telephone Encounter (Signed)
Refill Request.  

## 2020-04-19 ENCOUNTER — Telehealth (INDEPENDENT_AMBULATORY_CARE_PROVIDER_SITE_OTHER): Payer: Self-pay | Admitting: Nurse Practitioner

## 2020-04-19 NOTE — Telephone Encounter (Signed)
The pt was seen om 5/7 for a laser procedure on the LLE. The pt now has a wound in the LLE from the laser treatment his is due to see wound care on 6/3 but the nurse were he lives thinks he should be  seen sooner the pt is scheduled for a wound check  here on 5/27 @ 10:30AM. I called [name] back to ask screening question about the wound  but got no answer. Is this soon enough?

## 2020-04-19 NOTE — Telephone Encounter (Signed)
He already has an appointment for tomorrow, no further action needed

## 2020-04-19 NOTE — Telephone Encounter (Signed)
Let's get him in tomorrow, no studies needed

## 2020-04-20 ENCOUNTER — Encounter (INDEPENDENT_AMBULATORY_CARE_PROVIDER_SITE_OTHER): Payer: Self-pay | Admitting: Nurse Practitioner

## 2020-04-20 ENCOUNTER — Ambulatory Visit (INDEPENDENT_AMBULATORY_CARE_PROVIDER_SITE_OTHER): Payer: Medicare Other | Admitting: Nurse Practitioner

## 2020-04-20 ENCOUNTER — Other Ambulatory Visit: Payer: Self-pay

## 2020-04-20 VITALS — BP 127/62 | HR 76 | Ht 71.0 in | Wt 207.0 lb

## 2020-04-20 DIAGNOSIS — L97222 Non-pressure chronic ulcer of left calf with fat layer exposed: Secondary | ICD-10-CM

## 2020-04-20 DIAGNOSIS — I10 Essential (primary) hypertension: Secondary | ICD-10-CM | POA: Diagnosis not present

## 2020-04-20 DIAGNOSIS — L97321 Non-pressure chronic ulcer of left ankle limited to breakdown of skin: Secondary | ICD-10-CM | POA: Diagnosis not present

## 2020-04-20 DIAGNOSIS — I83022 Varicose veins of left lower extremity with ulcer of calf: Secondary | ICD-10-CM | POA: Diagnosis not present

## 2020-04-20 MED ORDER — CEPHALEXIN 500 MG PO CAPS: 500.0000 mg | ORAL_CAPSULE | Freq: Three times a day (TID) | ORAL | 0 refills | Status: DC

## 2020-04-25 ENCOUNTER — Encounter (INDEPENDENT_AMBULATORY_CARE_PROVIDER_SITE_OTHER): Payer: Self-pay | Admitting: Nurse Practitioner

## 2020-04-25 NOTE — Progress Notes (Signed)
Subjective:    Patient ID: [name], male    DOB: [birth year], 86 y.o.   MRN: [id number] Chief Complaint  Patient presents with  . Follow-up    Wound check    Patient presents today with concerns about his left lower extremity ulceration.  The patient states that he was told by his nursing facility that if someone did not look at it soon it would get any better.  The patient's facility made a wound care referral for him for evaluation of his wound.  However, the patient had known venous insufficiency and recently underwent a left lower extremity endovenous ablation to treat his incompetent great saphenous vein.  This was also done to try to help with progressing wound healing.  Today of the leg is swollen with some redness.  Typically redness is consistent with post endovenous laser ablation however patient does note that it is tender to the touch and hot.  She denies any fever, chills, nausea, vomiting or diarrhea.  Otherwise the patient feels well.   Review of Systems  Cardiovascular: Positive for leg swelling.  Skin: Positive for wound.  Neurological: Positive for weakness.  All other systems reviewed and are negative.      Objective:   Physical Exam Vitals reviewed.  HENT:     Head: Normocephalic.  Pulmonary:     Effort: Pulmonary effort is normal.     Breath sounds: Normal breath sounds.  Skin:    Comments: Venous ulcer left lower extremity  Neurological:     Mental Status: He is alert and oriented to person, place, and time.  Psychiatric:        Mood and Affect: Mood normal.        Behavior: Behavior normal.        Thought Content: Thought content normal.        Judgment: Judgment normal.     BP 127/62   Pulse 76   Ht 5\' 11"  (1.803 m)   Wt 207 lb (93.9 kg)   BMI 28.87 kg/m   Past Medical History:  Diagnosis Date  . Anxiety 10/11  . Aortic stenosis    a. s/p mechcanical AVR, 2002; b. 10/2018 Echo: Triv AI, mean grad 11/2018.  . Bradycardia    chronic, no symptoms 07/2010  . C. difficile colitis   . Carotid bruit    dopplers in past, no abnormalities  . Chronic diastolic CHF (congestive heart failure) (HCC)    a. Echo 03/2015: EF 60-65%, no RWMA, GR1DD, mild BAE, mild to mod MR, mod TR, PASP 65 mmHg; b. 01/2017 Echo: EF 55-60%, NRWMA, grade 1 diastolic dysfunction.  Normal functioning prosthetic aortic valve.  Mean gradient 50 mmHg.  Sev TR. PASP 02/2017; c. 10/2018 Echo: EF 55-60%, Triv AI, mod dil LA, mod-sev TR, PASP 35-40, mild to mod red RV fxn.  . Coronary artery disease    a. mild, cath, 08/2010; b. medically managed  . Decreased hearing    Right ear  . Depression   . Gastric ulcer   . GERD (gastroesophageal reflux disease)   . Hypertension    BP higher than usual 04/19/10; amlodipine increased by telephone  . Mod-Sev Tricuspid regurgitation    a. 10/2018 Echo: Mod-Sev TR, PASP 35-28mmHg.  43m RLS (restless legs syndrome) 08/23/2015  . S/P AVR    a. St. Jude. mechanical 2002; b. echo 08/2010 EF 60%, trival AI, mild MR, AVR working well; c. on longterm warfarin tx  . SOB (shortness of breath)  10/11   08/2010,Episodes at 5 AM, eventually felt to be anxiety, after complete workup including catheterization, pt greatly improved with anxiety meds 11/11    Social History   Socioeconomic History  . Marital status: Married    Spouse name: Not on file  . Number of children: 3  . Years of education: some coll.  . Highest education level: Not on file  Occupational History  . Occupation: Retired    Associate Professor: RETIRED  Tobacco Use  . Smoking status: Former Smoker    Types: Cigarettes    Quit date: 1979    Years since quitting: 42.4  . Smokeless tobacco: Never Used  Substance and Sexual Activity  . Alcohol use: Yes    Alcohol/week: 3.0 standard drinks    Types: 3 Glasses of wine per week    Comment: Social  . Drug use: No  . Sexual activity: Never  Other Topics Concern  . Not on file  Social History Narrative   No regular  exercise.   Patient lives at [facility]. His wife lives in same facility but she lives in the assisted living part.      Patient drinks 1-2 cups of caffeine daily.   Patient is right handed.   Social Determinants of Health   Financial Resource Strain:   . Difficulty of Paying Living Expenses:   Food Insecurity:   . Worried About Programme researcher, broadcasting/film/video in the Last Year:   . Barista in the Last Year:   Transportation Needs:   . Freight forwarder (Medical):   Marland Kitchen Lack of Transportation (Non-Medical):   Physical Activity:   . Days of Exercise per Week:   . Minutes of Exercise per Session:   Stress:   . Feeling of Stress :   Social Connections:   . Frequency of Communication with Friends and Family:   . Frequency of Social Gatherings with Friends and Family:   . Attends Religious Services:   . Active Member of Clubs or Organizations:   . Attends Banker Meetings:   Marland Kitchen Marital Status:   Intimate Partner Violence:   . Fear of Current or Ex-Partner:   . Emotionally Abused:   Marland Kitchen Physically Abused:   . Sexually Abused:     Past Surgical History:  Procedure Laterality Date  . CARDIAC CATHETERIZATION    . ESOPHAGOGASTRODUODENOSCOPY N/A 04/05/2015   Procedure: ESOPHAGOGASTRODUODENOSCOPY (EGD);  Surgeon: Scot Jun, MD;  Location: [facility] ENDOSCOPY;  Service: Endoscopy;  Laterality: N/A;  . ESOPHAGOGASTRODUODENOSCOPY N/A 04/17/2015   Procedure: ESOPHAGOGASTRODUODENOSCOPY (EGD);  Surgeon: Scot Jun, MD;  Location: [facility] ENDOSCOPY;  Service: Endoscopy;  Laterality: N/A;  . ESOPHAGOGASTRODUODENOSCOPY N/A 08/02/2015   Procedure: ESOPHAGOGASTRODUODENOSCOPY (EGD);  Surgeon: Wallace Cullens, MD;  Location: [facility] ENDOSCOPY;  Service: Endoscopy;  Laterality: N/A;  . HERNIA REPAIR    . JOINT REPLACEMENT    . TOTAL HIP ARTHROPLASTY    . VALVE REPLACEMENT  1/02   Aortic; echo 3/09 valve working well; echo 10/11 working well; put on Coumadin    Family History  Problem  Relation Age of Onset  . Hypertension Mother   . Diabetes type II Mother   . Heart disease Mother   . Heart attack Mother   . Hypertension Father   . Heart disease Father   . Stroke Father   . Stroke Brother   . Stroke Brother   . Hypertension Other     Allergies  Allergen Reactions  . Levofloxacin Nausea  Only  . Sulfa Antibiotics Other (See Comments)    Reaction:  Unknown        Assessment & Plan:   1. Lower limb ulcer, ankle, left, limited to breakdown of skin [facility]) The patient still has his persistent wound of his lower extremity ulceration.  However the patient recently underwent endovenous laser ablation which should help with healing for the patient's lower extremity.  However the lower extremity is swollen and reddened today.  This could be resulting from his most recent endovenous laser ablation or may be early cellulitis.  Out of extreme precaution we will treat the patient with antibiotics.  We will also have the patient return to in a wrap on this left lower extremity utilizing Aquacel on the wound.  The patient is advised to have his nursing facility change his wrap on Thursday and we will have the patient keep his 4-week schedule follow-up visit.  If the wound continues to show signs symptoms of delayed healing we may consider referral to the wound care center. - cephALEXin (KEFLEX) 500 MG capsule; Take 1 capsule (500 mg total) by mouth 3 (three) times daily.  Dispense: 21 capsule; Refill: 0  2. 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  The patient still has some redness and swelling however this may be cellulitis versus the procedure.  The patient will follow up in office for reevaluation of his lower extremity after his had approximately 4 weeks to heal.  Otherwise we will follow up plan of care as listed above.  3. Essential  hypertension Continue antihypertensive medications as already ordered, these medications have been reviewed and there are no changes at this time.     Current Outpatient Medications on File Prior to Visit  Medication Sig Dispense Refill  . allopurinol (ZYLOPRIM) 100 MG tablet Take 200 mg by mouth daily.    . Cetirizine HCl 10 MG CAPS Take 10 mg by mouth daily.    . citalopram (CELEXA) 20 MG tablet Take 20 mg by mouth daily.    . cloNIDine (CATAPRES) 0.1 MG tablet TAKE 1 TABLET BY MOUTH  TWICE DAILY 180 tablet 3  . doxazosin (CARDURA) 8 MG tablet     . finasteride (PROSCAR) 5 MG tablet Take 5 mg by mouth daily.      . isosorbide mononitrate (IMDUR) 60 MG 24 hr tablet TAKE 1 TABLET (60 MG TOTAL) BY MOUTH 2 (TWO) TIMES DAILY. 60 tablet 0  . metolazone (ZAROXOLYN) 2.5 MG tablet Take 1 tablet (2.5 mg total) by mouth 2 (two) times a week. 72 tablet 3  . metoprolol succinate (TOPROL-XL) 25 MG 24 hr tablet Take 0.5 tablets (12.5 mg total) by mouth daily. 90 tablet 3  . potassium chloride (KLOR-CON) 10 MEQ tablet Take 3 tablets (30 mEq total) by mouth daily. Take an additional 2 tablets on the days you take metolazone. 318 tablet 3  . predniSONE (DELTASONE) 10 MG tablet 6 pills x 1 day, 5 pills x 1 day, 4 pills x 1 day, 3 pills x 1 day, 2 pills x 1 day. 1 pill x 1 day.    . ranitidine (ZANTAC) 150 MG tablet Take 150 mg by mouth as needed.    Marland Kitchen rOPINIRole (REQUIP) 2 MG tablet TAKE 1 TABLET BY MOUTH 4  TIMES DAILY 360 tablet 3  . Tamsulosin HCl (FLOMAX) 0.4 MG CAPS Take 0.4 mg by  mouth at bedtime.     . torsemide (DEMADEX) 20 MG tablet TAKE 2 TABLETS BY MOUTH  TWICE DAILY 360 tablet 3  . traMADol (ULTRAM) 50 MG tablet Take 50 mg by mouth 2 (two) times daily as needed.    . traZODone (DESYREL) 50 MG tablet Take 50 mg by mouth at bedtime.    Marland Kitchen warfarin (COUMADIN) 5 MG tablet TAKE 1 TABLET BY MOUTH  DAILY OR AS DIRECTED BY THE ANTICOAGULATION CLINIC 90 tablet 0   No current facility-administered  medications on file prior to visit.    There are no Patient Instructions on file for this visit. No follow-ups on file.   Georgiana Spinner, NP

## 2020-04-27 ENCOUNTER — Ambulatory Visit: Payer: Medicare Other | Admitting: Physician Assistant

## 2020-04-28 ENCOUNTER — Telehealth: Payer: Self-pay | Admitting: Cardiovascular Disease

## 2020-04-28 MED ORDER — POTASSIUM CHLORIDE ER 10 MEQ PO TBCR: 30.0000 meq | EXTENDED_RELEASE_TABLET | Freq: Every day | ORAL | 0 refills | Status: DC

## 2020-04-28 NOTE — Telephone Encounter (Signed)
*  STAT* If patient is at the pharmacy, call can be transferred to refill team.   1. Which medications need to be refilled? (please list name of each medication and dose if known) Potassium chloride 10 meq 3x/day  2. Which pharmacy/location (including street and city if local pharmacy) is medication to be sent to? [facility] on [address]   3. Do they need a 30 day or 90 day supply? 30 pills (patient needs 10 days worth till [facility] can get medication to patient)

## 2020-04-28 NOTE — Telephone Encounter (Signed)
Requested Prescriptions   Signed Prescriptions Disp Refills   potassium chloride (KLOR-CON) 10 MEQ tablet 36 tablet 0    Sig: Take 3 tablets (30 mEq total) by mouth daily. Take an additional 2 tablets on the days you take metolazone.    Authorizing Provider: Antonieta Iba    Ordering User: Thayer Headings, Laylia Mui L

## 2020-05-01 ENCOUNTER — Encounter (INDEPENDENT_AMBULATORY_CARE_PROVIDER_SITE_OTHER): Payer: Self-pay | Admitting: Nurse Practitioner

## 2020-05-01 ENCOUNTER — Ambulatory Visit (INDEPENDENT_AMBULATORY_CARE_PROVIDER_SITE_OTHER): Payer: Medicare Other | Admitting: Nurse Practitioner

## 2020-05-01 ENCOUNTER — Other Ambulatory Visit: Payer: Self-pay

## 2020-05-01 VITALS — BP 113/67 | HR 61 | Ht 71.0 in | Wt 205.0 lb

## 2020-05-01 DIAGNOSIS — I83022 Varicose veins of left lower extremity with ulcer of calf: Secondary | ICD-10-CM

## 2020-05-01 DIAGNOSIS — L97222 Non-pressure chronic ulcer of left calf with fat layer exposed: Secondary | ICD-10-CM

## 2020-05-01 DIAGNOSIS — I10 Essential (primary) hypertension: Secondary | ICD-10-CM | POA: Diagnosis not present

## 2020-05-01 DIAGNOSIS — L97321 Non-pressure chronic ulcer of left ankle limited to breakdown of skin: Secondary | ICD-10-CM | POA: Diagnosis not present

## 2020-05-01 NOTE — Progress Notes (Signed)
Subjective:    Patient ID: [name], male    DOB: 1931-06-01, 86 y.o.   MRN: [id number] Chief Complaint  Patient presents with  . Follow-up    4 week post laser    The patient returns to the office for followup status post laser ablation of the left great saphenous vein on 03/31/2020.  The patient has had a very slow healing venous ulceration.  Today it appears improved as it is more shallow with more granulation tissue at the wound bed.  At previous office visit the patient had evidence of cellulitis however that has resolved.  Overall the wound is appearing much better.  The patient is otherwise done well and there have been no complications related to the laser procedure or interval changes in the patient's overall   Venous ultrasound post laser shows successful laser ablation of the left lower extremity, no DVT identified.   Review of Systems  Cardiovascular: Positive for leg swelling.  Skin: Positive for wound.  Neurological: Positive for weakness.  All other systems reviewed and are negative.      Objective:   Physical Exam Vitals reviewed.  HENT:     Head: Normocephalic.  Cardiovascular:     Rate and Rhythm: Normal rate and regular rhythm.     Pulses: Normal pulses.     Heart sounds: Normal heart sounds.  Pulmonary:     Effort: Pulmonary effort is normal.     Breath sounds: Normal breath sounds.  Musculoskeletal:     Left lower leg: Edema present.  Skin:    General: Skin is warm and dry.     Comments: Ulceration above left medial ankle  Neurological:     Mental Status: He is alert and oriented to person, place, and time.     Motor: Weakness present.     Gait: Gait abnormal.  Psychiatric:        Mood and Affect: Mood normal.        Behavior: Behavior normal.        Thought Content: Thought content normal.        Judgment: Judgment normal.     BP 113/67   Pulse 61   Ht 5\' 11"  (1.803 m)   Wt 205 lb (93 kg)   BMI 28.59 kg/m   Past Medical  History:  Diagnosis Date  . Anxiety 10/11  . Aortic stenosis    a. s/p mechcanical AVR, 2002; b. 10/2018 Echo: Triv AI, mean grad 11/2018.  . Bradycardia    chronic, no symptoms 07/2010  . C. difficile colitis   . Carotid bruit    dopplers in past, no abnormalities  . Chronic diastolic CHF (congestive heart failure) (HCC)    a. Echo 03/2015: EF 60-65%, no RWMA, GR1DD, mild BAE, mild to mod MR, mod TR, PASP 65 mmHg; b. 01/2017 Echo: EF 55-60%, NRWMA, grade 1 diastolic dysfunction.  Normal functioning prosthetic aortic valve.  Mean gradient 50 mmHg.  Sev TR. PASP 02/2017; c. 10/2018 Echo: EF 55-60%, Triv AI, mod dil LA, mod-sev TR, PASP 35-40, mild to mod red RV fxn.  . Coronary artery disease    a. mild, cath, 08/2010; b. medically managed  . Decreased hearing    Right ear  . Depression   . Gastric ulcer   . GERD (gastroesophageal reflux disease)   . Hypertension    BP higher than usual 04/19/10; amlodipine increased by telephone  . Mod-Sev Tricuspid regurgitation    a. 10/2018 Echo: Mod-Sev TR,  PASP 35-1mmHg.  Marland Kitchen RLS (restless legs syndrome) 08/23/2015  . S/P AVR    a. St. Jude. mechanical 2002; b. echo 08/2010 EF 60%, trival AI, mild MR, AVR working well; c. on longterm warfarin tx  . SOB (shortness of breath) 10/11   08/2010,Episodes at 5 AM, eventually felt to be anxiety, after complete workup including catheterization, pt greatly improved with anxiety meds 11/11    Social History   Socioeconomic History  . Marital status: Married    Spouse name: Not on file  . Number of children: 3  . Years of education: some coll.  . Highest education level: Not on file  Occupational History  . Occupation: Retired    Associate Professor: RETIRED  Tobacco Use  . Smoking status: Former Smoker    Types: Cigarettes    Quit date: 1979    Years since quitting: 42.4  . Smokeless tobacco: Never Used  Substance and Sexual Activity  . Alcohol use: Yes    Alcohol/week: 3.0 standard drinks    Types: 3 Glasses  of wine per week    Comment: Social  . Drug use: No  . Sexual activity: Never  Other Topics Concern  . Not on file  Social History Narrative   No regular exercise.   Patient lives at [facility]. His wife lives in same facility but she lives in the assisted living part.      Patient drinks 1-2 cups of caffeine daily.   Patient is right handed.   Social Determinants of Health   Financial Resource Strain:   . Difficulty of Paying Living Expenses:   Food Insecurity:   . Worried About Programme researcher, broadcasting/film/video in the Last Year:   . Barista in the Last Year:   Transportation Needs:   . Freight forwarder (Medical):   Marland Kitchen Lack of Transportation (Non-Medical):   Physical Activity:   . Days of Exercise per Week:   . Minutes of Exercise per Session:   Stress:   . Feeling of Stress :   Social Connections:   . Frequency of Communication with Friends and Family:   . Frequency of Social Gatherings with Friends and Family:   . Attends Religious Services:   . Active Member of Clubs or Organizations:   . Attends Banker Meetings:   Marland Kitchen Marital Status:   Intimate Partner Violence:   . Fear of Current or Ex-Partner:   . Emotionally Abused:   Marland Kitchen Physically Abused:   . Sexually Abused:     Past Surgical History:  Procedure Laterality Date  . CARDIAC CATHETERIZATION    . ESOPHAGOGASTRODUODENOSCOPY N/A 04/05/2015   Procedure: ESOPHAGOGASTRODUODENOSCOPY (EGD);  Surgeon: Scot Jun, MD;  Location: [facility] ENDOSCOPY;  Service: Endoscopy;  Laterality: N/A;  . ESOPHAGOGASTRODUODENOSCOPY N/A 04/17/2015   Procedure: ESOPHAGOGASTRODUODENOSCOPY (EGD);  Surgeon: Scot Jun, MD;  [facility] ENDOSCOPY;  Service: Endoscopy;  Laterality: N/A;  . ESOPHAGOGASTRODUODENOSCOPY N/A 08/02/2015   Procedure: ESOPHAGOGASTRODUODENOSCOPY (EGD);  Surgeon: Wallace Cullens, MD;  Location: [facility] ENDOSCOPY;  Service: Endoscopy;  Laterality: N/A;  . HERNIA REPAIR    . JOINT REPLACEMENT    . TOTAL HIP  ARTHROPLASTY    . VALVE REPLACEMENT  1/02   Aortic; echo 3/09 valve working well; echo 10/11 working well; put on Coumadin    Family History  Problem Relation Age of Onset  . Hypertension Mother   . Diabetes type II Mother   . Heart disease Mother   . Heart attack  Mother   . Hypertension Father   . Heart disease Father   . Stroke Father   . Stroke Brother   . Stroke Brother   . Hypertension Other     Allergies  Allergen Reactions  . Levofloxacin Nausea Only  . Sulfa Antibiotics Other (See Comments)    Reaction:  Unknown        Assessment & Plan:   1. Lower limb ulcer, ankle, left, limited to breakdown of skin ([place]) Patient's venous ulceration does appear to be getting smaller in size and progressively healing.  I discussed with the patient and family that since this wound has been longstanding we will continue with his wraps for another 4 weeks.  If we are indeed showing progression we will continue with our original plan however if there is still not great progress in wound healing will refer to the wound care center.  Now that the patient has had at his endovenous laser ablation we are hopeful that this will increase his wound healing progress.  2. 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    I have had a long discussion with the patient regarding  varicose veins and why they cause symptoms.  Patient will begin wearing graduated compression stockings on a daily basis, beginning first thing in the morning and removing them in the evening. The patient is instructed specifically not to sleep in the stockings.    The patient  will also begin using over-the-counter analgesics such as Motrin 600 mg po TID to help control the symptoms as needed.    In addition, behavioral modification including elevation during the day will be initiated, utilizing a recliner was recommended.  The  patient is also instructed to continue exercising such as walking 4-5 times per week.  At this time the patient wishes to continue conservative therapy and is not interested in more invasive treatments such as laser ablation and sclerotherapy.    3. Essential hypertension Continue antihypertensive medications as already ordered, these medications have been reviewed and there are no changes at this time.    Current Outpatient Medications on File Prior to Visit  Medication Sig Dispense Refill  . allopurinol (ZYLOPRIM) 100 MG tablet Take 200 mg by mouth daily.    . cephALEXin (KEFLEX) 500 MG capsule Take 1 capsule (500 mg total) by mouth 3 (three) times daily. 21 capsule 0  . Cetirizine HCl 10 MG CAPS Take 10 mg by mouth daily.    . citalopram (CELEXA) 20 MG tablet Take 20 mg by mouth daily.    . cloNIDine (CATAPRES) 0.1 MG tablet TAKE 1 TABLET BY MOUTH  TWICE DAILY 180 tablet 3  . doxazosin (CARDURA) 8 MG tablet     . finasteride (PROSCAR) 5 MG tablet Take 5 mg by mouth daily.      . isosorbide mononitrate (IMDUR) 60 MG 24 hr tablet TAKE 1 TABLET (60 MG TOTAL) BY MOUTH 2 (TWO) TIMES DAILY. 60 tablet 0  . metolazone (ZAROXOLYN) 2.5 MG tablet Take 1 tablet (2.5 mg total) by mouth 2 (two) times a week. 72 tablet 3  . metoprolol succinate (TOPROL-XL) 25 MG 24 hr tablet Take 0.5 tablets (12.5 mg total) by mouth daily. 90 tablet 3  . potassium chloride (KLOR-CON) 10 MEQ tablet Take 3 tablets (30 mEq total) by mouth daily. Take an additional 2 tablets on the days you take metolazone. 36 tablet 0  .  predniSONE (DELTASONE) 10 MG tablet 6 pills x 1 day, 5 pills x 1 day, 4 pills x 1 day, 3 pills x 1 day, 2 pills x 1 day. 1 pill x 1 day.    . ranitidine (ZANTAC) 150 MG tablet Take 150 mg by mouth as needed.    Marland Kitchen rOPINIRole (REQUIP) 2 MG tablet TAKE 1 TABLET BY MOUTH 4  TIMES DAILY 360 tablet 3  . Tamsulosin HCl (FLOMAX) 0.4 MG CAPS Take 0.4 mg by mouth at bedtime.     . torsemide (DEMADEX) 20 MG tablet  TAKE 2 TABLETS BY MOUTH  TWICE DAILY 360 tablet 3  . traMADol (ULTRAM) 50 MG tablet Take 50 mg by mouth 2 (two) times daily as needed.    . traZODone (DESYREL) 50 MG tablet Take 50 mg by mouth at bedtime.    Marland Kitchen warfarin (COUMADIN) 5 MG tablet TAKE 1 TABLET BY MOUTH  DAILY OR AS DIRECTED BY THE ANTICOAGULATION CLINIC 90 tablet 0   No current facility-administered medications on file prior to visit.    There are no Patient Instructions on file for this visit. No follow-ups on file.   Georgiana Spinner, NP

## 2020-05-05 ENCOUNTER — Other Ambulatory Visit: Payer: Self-pay

## 2020-05-05 MED ORDER — ISOSORBIDE MONONITRATE ER 60 MG PO TB24: 60.0000 mg | ORAL_TABLET | Freq: Two times a day (BID) | ORAL | 0 refills | Status: DC

## 2020-05-08 ENCOUNTER — Encounter (INDEPENDENT_AMBULATORY_CARE_PROVIDER_SITE_OTHER): Payer: Self-pay | Admitting: Nurse Practitioner

## 2020-05-08 ENCOUNTER — Other Ambulatory Visit: Payer: Self-pay

## 2020-05-08 ENCOUNTER — Ambulatory Visit (INDEPENDENT_AMBULATORY_CARE_PROVIDER_SITE_OTHER): Payer: Medicare Other | Admitting: Nurse Practitioner

## 2020-05-08 VITALS — BP 129/59 | HR 67 | Ht 70.0 in | Wt 200.0 lb

## 2020-05-08 DIAGNOSIS — L97321 Non-pressure chronic ulcer of left ankle limited to breakdown of skin: Secondary | ICD-10-CM | POA: Diagnosis not present

## 2020-05-08 NOTE — Progress Notes (Signed)
History of Present Illness  There is no documented history at this time  Assessments & Plan   There are no diagnoses linked to this encounter.    Additional instructions  Subjective:  Patient presents with venous ulcer of the Left lower extremity.    Procedure:  3 layer unna wrap was placed Left lower extremity.   Plan:   Follow up in one week.  

## 2020-05-10 ENCOUNTER — Other Ambulatory Visit: Payer: Self-pay

## 2020-05-10 ENCOUNTER — Ambulatory Visit (INDEPENDENT_AMBULATORY_CARE_PROVIDER_SITE_OTHER): Payer: Medicare Other | Admitting: Nurse Practitioner

## 2020-05-10 ENCOUNTER — Telehealth (INDEPENDENT_AMBULATORY_CARE_PROVIDER_SITE_OTHER): Payer: Self-pay | Admitting: Vascular Surgery

## 2020-05-10 VITALS — BP 126/61 | HR 88 | Ht 70.0 in | Wt 203.0 lb

## 2020-05-10 DIAGNOSIS — I83891 Varicose veins of right lower extremities with other complications: Secondary | ICD-10-CM

## 2020-05-10 NOTE — Telephone Encounter (Signed)
Called this morning to make [place] aware that he accidentally got his unna wrap wet in the shower, he was wondering if he should come in to get rewrapped. Patient was last seen Monday 05/08/20 for unna wrap. Please advise.

## 2020-05-10 NOTE — Progress Notes (Signed)
History of Present Illness  There is no documented history at this time  Assessments & Plan   There are no diagnoses linked to this encounter.    Additional instructions  Subjective:  Patient presents with venous ulcer of the Left lower extremity.    Procedure:  3 layer unna wrap was placed Left lower extremity.   Plan:   Follow up in one week.  

## 2020-05-10 NOTE — Telephone Encounter (Signed)
I spoke with Sheppard Plumber NP and she is fine with the patient coming in to be rewrapped

## 2020-05-12 ENCOUNTER — Encounter (INDEPENDENT_AMBULATORY_CARE_PROVIDER_SITE_OTHER): Payer: Self-pay | Admitting: Nurse Practitioner

## 2020-05-15 ENCOUNTER — Ambulatory Visit (INDEPENDENT_AMBULATORY_CARE_PROVIDER_SITE_OTHER): Payer: Medicare Other

## 2020-05-15 ENCOUNTER — Encounter (INDEPENDENT_AMBULATORY_CARE_PROVIDER_SITE_OTHER): Payer: Self-pay

## 2020-05-15 ENCOUNTER — Ambulatory Visit (INDEPENDENT_AMBULATORY_CARE_PROVIDER_SITE_OTHER): Payer: Medicare Other | Admitting: Nurse Practitioner

## 2020-05-15 ENCOUNTER — Other Ambulatory Visit: Payer: Self-pay

## 2020-05-15 VITALS — BP 106/58 | HR 58 | Resp 16 | Wt 208.4 lb

## 2020-05-15 DIAGNOSIS — L97321 Non-pressure chronic ulcer of left ankle limited to breakdown of skin: Secondary | ICD-10-CM

## 2020-05-15 DIAGNOSIS — Z952 Presence of prosthetic heart valve: Secondary | ICD-10-CM

## 2020-05-15 DIAGNOSIS — Z7901 Long term (current) use of anticoagulants: Secondary | ICD-10-CM | POA: Diagnosis not present

## 2020-05-15 DIAGNOSIS — I350 Nonrheumatic aortic (valve) stenosis: Secondary | ICD-10-CM

## 2020-05-15 LAB — POCT INR: INR: 3.6 — AB (ref 2.0–3.0)

## 2020-05-15 NOTE — Progress Notes (Signed)
History of Present Illness  There is no documented history at this time  Assessments & Plan   There are no diagnoses linked to this encounter.    Additional instructions  Subjective:  Patient presents with venous ulcer of the Left lower extremity.    Procedure:  3 layer unna wrap was placed Left lower extremity.   Plan:   Follow up in one week.  

## 2020-05-15 NOTE — Patient Instructions (Signed)
-   have a large serving of greens today - Continue warfarin dosage of 1 tablet every day.  - Recheck INR in 6 weeks.

## 2020-05-16 ENCOUNTER — Encounter (INDEPENDENT_AMBULATORY_CARE_PROVIDER_SITE_OTHER): Payer: Self-pay | Admitting: Nurse Practitioner

## 2020-05-20 ENCOUNTER — Other Ambulatory Visit: Payer: Self-pay | Admitting: Cardiovascular Disease

## 2020-05-22 ENCOUNTER — Encounter (INDEPENDENT_AMBULATORY_CARE_PROVIDER_SITE_OTHER): Payer: Medicare Other

## 2020-05-23 ENCOUNTER — Ambulatory Visit (INDEPENDENT_AMBULATORY_CARE_PROVIDER_SITE_OTHER): Payer: Medicare Other | Admitting: Nurse Practitioner

## 2020-05-23 ENCOUNTER — Other Ambulatory Visit: Payer: Self-pay

## 2020-05-23 ENCOUNTER — Encounter (INDEPENDENT_AMBULATORY_CARE_PROVIDER_SITE_OTHER): Payer: Self-pay

## 2020-05-23 VITALS — BP 107/50 | HR 71 | Ht 71.0 in | Wt 205.0 lb

## 2020-05-23 DIAGNOSIS — I10 Essential (primary) hypertension: Secondary | ICD-10-CM | POA: Diagnosis not present

## 2020-05-23 DIAGNOSIS — L97321 Non-pressure chronic ulcer of left ankle limited to breakdown of skin: Secondary | ICD-10-CM

## 2020-05-23 DIAGNOSIS — I890 Lymphedema, not elsewhere classified: Secondary | ICD-10-CM

## 2020-05-23 NOTE — Progress Notes (Signed)
Subjective:    Patient ID: [name], male    DOB: 1931-01-17, 86 y.o.   MRN: [id number] No chief complaint on file.   The patient returns to the office for followup status post laser ablation of the left great saphenous vein on 03/31/2020.  The patient has had a very slow healing venous ulceration.  Today the wound appears improved and it is slightly smaller in diameter however the depth has not changed.  The wound bed does appear to be red and beefy.  There is no evidence of cellulitis today.  However, since the patient's recent endovenous ablation on 03/31/2020, the wound has continued to be very slow to heal.  At the previous office visit I discussed with the patient and his daughter about possibly proceeding to the wound clinic for evaluation as we have been treating his current wound for months without significant progression.  The patient is otherwise done well and there have been no complications related to the laser procedure or interval changes in the patient's overall.  Patient denies any fever, chills, nausea, vomiting or diarrhea.  He denies any chest pain or shortness of breath.    Review of Systems  Cardiovascular: Positive for leg swelling.  Skin: Positive for wound.  All other systems reviewed and are negative.      Objective:   Physical Exam Vitals reviewed.  HENT:     Head: Normocephalic.  Cardiovascular:     Rate and Rhythm: Normal rate and regular rhythm.     Pulses: Normal pulses.     Heart sounds: Normal heart sounds.  Pulmonary:     Effort: Pulmonary effort is normal.     Breath sounds: Normal breath sounds.  Musculoskeletal:        General: Swelling present.  Skin:    General: Skin is warm and dry.     Comments: Left medial ankle ulcer  Neurological:     Mental Status: He is alert and oriented to person, place, and time.     Motor: Weakness present.  Psychiatric:        Mood and Affect: Mood normal.        Behavior: Behavior normal.         Thought Content: Thought content normal.        Judgment: Judgment normal.     BP (!) 107/50   Pulse 71   Ht 5\' 11"  (1.803 m)   Wt 205 lb (93 kg)   BMI 28.59 kg/m   Past Medical History:  Diagnosis Date  . Anxiety 10/11  . Aortic stenosis    a. s/p mechcanical AVR, 2002; b. 10/2018 Echo: Triv AI, mean grad 11/2018.  . Bradycardia    chronic, no symptoms 07/2010  . C. difficile colitis   . Carotid bruit    dopplers in past, no abnormalities  . Chronic diastolic CHF (congestive heart failure) (HCC)    a. Echo 03/2015: EF 60-65%, no RWMA, GR1DD, mild BAE, mild to mod MR, mod TR, PASP 65 mmHg; b. 01/2017 Echo: EF 55-60%, NRWMA, grade 1 diastolic dysfunction.  Normal functioning prosthetic aortic valve.  Mean gradient 50 mmHg.  Sev TR. PASP 02/2017; c. 10/2018 Echo: EF 55-60%, Triv AI, mod dil LA, mod-sev TR, PASP 35-40, mild to mod red RV fxn.  . Coronary artery disease    a. mild, cath, 08/2010; b. medically managed  . Decreased hearing    Right ear  . Depression   . Gastric ulcer   .  GERD (gastroesophageal reflux disease)   . Hypertension    BP higher than usual 04/19/10; amlodipine increased by telephone  . Mod-Sev Tricuspid regurgitation    a. 10/2018 Echo: Mod-Sev TR, PASP 35-3840mmHg.  Marland Kitchen. RLS (restless legs syndrome) 08/23/2015  . S/P AVR    a. St. Jude. mechanical 2002; b. echo 08/2010 EF 60%, trival AI, mild MR, AVR working well; c. on longterm warfarin tx  . SOB (shortness of breath) 10/11   08/2010,Episodes at 5 AM, eventually felt to be anxiety, after complete workup including catheterization, pt greatly improved with anxiety meds 11/11    Social History   Socioeconomic History  . Marital status: Married    Spouse name: Not on file  . Number of children: 3  . Years of education: some coll.  . Highest education level: Not on file  Occupational History  . Occupation: Retired    Associate Professormployer: RETIRED  Tobacco Use  . Smoking status: Former Smoker    Types: Cigarettes     Quit date: 1979    Years since quitting: 42.5  . Smokeless tobacco: Never Used  Vaping Use  . Vaping Use: Never used  Substance and Sexual Activity  . Alcohol use: Yes    Alcohol/week: 3.0 standard drinks    Types: 3 Glasses of wine per week    Comment: Social  . Drug use: No  . Sexual activity: Never  Other Topics Concern  . Not on file  Social History Narrative   No regular exercise.   [facility] His wife lives in same facility but she lives in the assisted living part.      Patient drinks 1-2 cups of caffeine daily.   Patient is right handed.   Social Determinants of Health   Financial Resource Strain:   . Difficulty of Paying Living Expenses:   Food Insecurity:   . Worried About Programme researcher, broadcasting/film/videounning Out of Food in the Last Year:   . Baristaan Out of Food in the Last Year:   Transportation Needs:   . Freight forwarderLack of Transportation (Medical):   Marland Kitchen. Lack of Transportation (Non-Medical):   Physical Activity:   . Days of Exercise per Week:   . Minutes of Exercise per Session:   Stress:   . Feeling of Stress :   Social Connections:   . Frequency of Communication with Friends and Family:   . Frequency of Social Gatherings with Friends and Family:   . Attends Religious Services:   . Active Member of Clubs or Organizations:   . Attends BankerClub or Organization Meetings:   Marland Kitchen. Marital Status:   Intimate Partner Violence:   . Fear of Current or Ex-Partner:   . Emotionally Abused:   Marland Kitchen. Physically Abused:   . Sexually Abused:     Past Surgical History:  Procedure Laterality Date  . CARDIAC CATHETERIZATION    . ESOPHAGOGASTRODUODENOSCOPY N/A 04/05/2015   Procedure: ESOPHAGOGASTRODUODENOSCOPY (EGD);  Surgeon: [name], MD;  [facility];  Service: Endoscopy;  Laterality: N/A;  . ESOPHAGOGASTRODUODENOSCOPY N/A 04/17/2015   Procedure: ESOPHAGOGASTRODUODENOSCOPY (EGD);  Surgeon: [name], MD;  [facility];  Service: Endoscopy;  Laterality: N/A;  .  ESOPHAGOGASTRODUODENOSCOPY N/A 08/02/2015   Procedure: ESOPHAGOGASTRODUODENOSCOPY (EGD);  Surgeon: [name], MD;  [facility];  Service: Endoscopy;  Laterality: N/A;  . HERNIA REPAIR    . JOINT REPLACEMENT    . TOTAL HIP ARTHROPLASTY    . VALVE REPLACEMENT  1/02   Aortic; echo 3/09 valve  working well; echo 10/11 working well; put on Coumadin    Family History  Problem Relation Age of Onset  . Hypertension Mother   . Diabetes type II Mother   . Heart disease Mother   . Heart attack Mother   . Hypertension Father   . Heart disease Father   . Stroke Father   . Stroke Brother   . Stroke Brother   . Hypertension Other     Allergies  Allergen Reactions  . Levofloxacin Nausea Only  . Sulfa Antibiotics Other (See Comments)    Reaction:  Unknown        Assessment & Plan:   1. Lower limb ulcer, ankle, left, limited to breakdown of skin (HCC) The patient has had a very slow healing venous ulceration.  Despite recent endovenous laser ablation the wound continues to persist.  Despite multiple wound treatments the wound does not appear to be any closure healing.  Based on this, we will have the patient referred to the wound clinic for management of the wound.  We will also continue to wrap the patient in wraps in the left lower extremity until he sees the wound center.  Once the wound center has seen the patient we will stop doing Unna wrap so that the wound care center can proceed with the prescribed treatments.  We will have the patient return to the office 8 weeks once he begins therapy with the wound center to continue to follow progress to ensure wound healing. - Ambulatory referral to Wound Clinic  2. Lymphedema We will continue to have the patient follow with conservative therapy.  This includes utilizing medical grade 1 compression stockings on his right lower extremity in addition to elevation and regular exercise.  We will continue to follow with the patient in regards  to his lymphedema.  3. Essential hypertension Continue antihypertensive medications as already ordered, these medications have been reviewed and there are no changes at this time.   Current Outpatient Medications on File Prior to Visit  Medication Sig Dispense Refill  . allopurinol (ZYLOPRIM) 100 MG tablet Take 200 mg by mouth daily.    Marland Kitchen allopurinol (ZYLOPRIM) 300 MG tablet One tab daily, 90 tabs    . cephALEXin (KEFLEX) 500 MG capsule Take 1 capsule (500 mg total) by mouth 3 (three) times daily. 21 capsule 0  . Cetirizine HCl 10 MG CAPS Take 10 mg by mouth daily.    . citalopram (CELEXA) 20 MG tablet Take 20 mg by mouth daily.    . cloNIDine (CATAPRES) 0.1 MG tablet TAKE 1 TABLET BY MOUTH  TWICE DAILY 180 tablet 3  . doxazosin (CARDURA) 8 MG tablet     . finasteride (PROSCAR) 5 MG tablet Take 5 mg by mouth daily.      . isosorbide mononitrate (IMDUR) 60 MG 24 hr tablet Take 1 tablet (60 mg total) by mouth 2 (two) times daily. 180 tablet 0  . metoprolol succinate (TOPROL-XL) 25 MG 24 hr tablet Take 0.5 tablets (12.5 mg total) by mouth daily. 90 tablet 3  . potassium chloride (KLOR-CON) 10 MEQ tablet Take 3 tablets (30 mEq total) by mouth daily. Take an additional 2 tablets on the days you take metolazone. 36 tablet 0  . predniSONE (DELTASONE) 10 MG tablet 6 pills x 1 day, 5 pills x 1 day, 4 pills x 1 day, 3 pills x 1 day, 2 pills x 1 day. 1 pill x 1 day.    . ranitidine (ZANTAC) 150 MG  tablet Take 150 mg by mouth as needed.    Marland Kitchen rOPINIRole (REQUIP) 2 MG tablet TAKE 1 TABLET BY MOUTH 4  TIMES DAILY 360 tablet 3  . Tamsulosin HCl (FLOMAX) 0.4 MG CAPS Take 0.4 mg by mouth at bedtime.     . torsemide (DEMADEX) 20 MG tablet TAKE 2 TABLETS BY MOUTH  TWICE DAILY 360 tablet 3  . traMADol (ULTRAM) 50 MG tablet Take 50 mg by mouth 2 (two) times daily as needed.    . traZODone (DESYREL) 50 MG tablet Take 50 mg by mouth at bedtime.    Marland Kitchen warfarin (COUMADIN) 5 MG tablet TAKE 1 TABLET BY MOUTH  DAILY OR  AS DIRECTED BY THE ANTICOAGULATION CLINIC 90 tablet 0  . allopurinol (ZYLOPRIM) 300 MG tablet Take 300 mg by mouth daily.    . metolazone (ZAROXOLYN) 2.5 MG tablet Take 1 tablet (2.5 mg total) by mouth 2 (two) times a week. 72 tablet 3   No current facility-administered medications on file prior to visit.    There are no Patient Instructions on file for this visit. No follow-ups on file.   Georgiana Spinner, NP

## 2020-05-30 ENCOUNTER — Encounter (INDEPENDENT_AMBULATORY_CARE_PROVIDER_SITE_OTHER): Payer: Self-pay

## 2020-05-30 ENCOUNTER — Other Ambulatory Visit: Payer: Self-pay

## 2020-05-30 ENCOUNTER — Ambulatory Visit (INDEPENDENT_AMBULATORY_CARE_PROVIDER_SITE_OTHER): Payer: Medicare Other | Admitting: Nurse Practitioner

## 2020-05-30 VITALS — BP 133/58 | HR 69 | Resp 16 | Wt 213.0 lb

## 2020-05-30 DIAGNOSIS — L97321 Non-pressure chronic ulcer of left ankle limited to breakdown of skin: Secondary | ICD-10-CM | POA: Diagnosis not present

## 2020-05-30 NOTE — Progress Notes (Signed)
History of Present Illness  There is no documented history at this time  Assessments & Plan   There are no diagnoses linked to this encounter.    Additional instructions  Subjective:  Patient presents with venous ulcer of the Left lower extremity.    Procedure:  3 layer unna wrap was placed Left lower extremity.   Plan:   Follow up in one week.  

## 2020-06-06 ENCOUNTER — Other Ambulatory Visit: Payer: Self-pay

## 2020-06-06 ENCOUNTER — Encounter (INDEPENDENT_AMBULATORY_CARE_PROVIDER_SITE_OTHER): Payer: Self-pay | Admitting: Nurse Practitioner

## 2020-06-06 ENCOUNTER — Ambulatory Visit (INDEPENDENT_AMBULATORY_CARE_PROVIDER_SITE_OTHER): Payer: Medicare Other | Admitting: Nurse Practitioner

## 2020-06-06 VITALS — BP 138/66 | HR 82 | Resp 16 | Wt 221.0 lb

## 2020-06-06 DIAGNOSIS — L97321 Non-pressure chronic ulcer of left ankle limited to breakdown of skin: Secondary | ICD-10-CM | POA: Diagnosis not present

## 2020-06-06 NOTE — Progress Notes (Signed)
History of Present Illness  There is no documented history at this time  Assessments & Plan   There are no diagnoses linked to this encounter.    Additional instructions  Subjective:  Patient presents with venous ulcer of the Left lower extremity.    Procedure:  3 layer unna wrap was placed Left lower extremity.   Plan:   Follow up in one week.  

## 2020-06-08 ENCOUNTER — Other Ambulatory Visit: Payer: Self-pay | Admitting: Cardiovascular Disease

## 2020-06-09 ENCOUNTER — Encounter (INDEPENDENT_AMBULATORY_CARE_PROVIDER_SITE_OTHER): Payer: Self-pay | Admitting: Nurse Practitioner

## 2020-06-09 ENCOUNTER — Telehealth (INDEPENDENT_AMBULATORY_CARE_PROVIDER_SITE_OTHER): Payer: Self-pay

## 2020-06-09 ENCOUNTER — Other Ambulatory Visit: Payer: Self-pay

## 2020-06-09 ENCOUNTER — Ambulatory Visit (INDEPENDENT_AMBULATORY_CARE_PROVIDER_SITE_OTHER): Payer: Medicare Other | Admitting: Nurse Practitioner

## 2020-06-09 VITALS — BP 133/66 | HR 93 | Resp 16

## 2020-06-09 DIAGNOSIS — I83891 Varicose veins of right lower extremities with other complications: Secondary | ICD-10-CM

## 2020-06-09 NOTE — Telephone Encounter (Signed)
Patient called and left a message stating that his right leg has some fluid coming out. I spoke with Sheppard Plumber NP and she is fine with the patient coming in today for right leg unna wrap.

## 2020-06-09 NOTE — Progress Notes (Signed)
History of Present Illness  There is no documented history at this time  Assessments & Plan   There are no diagnoses linked to this encounter.    Additional instructions  Subjective:  Patient presents with venous ulcer of the Right lower extremity.    Procedure:  3 layer unna wrap was placed Right lower extremity.   Plan:   Follow up in one week.   

## 2020-06-12 ENCOUNTER — Encounter (INDEPENDENT_AMBULATORY_CARE_PROVIDER_SITE_OTHER): Payer: Self-pay | Admitting: Nurse Practitioner

## 2020-06-12 ENCOUNTER — Ambulatory Visit (INDEPENDENT_AMBULATORY_CARE_PROVIDER_SITE_OTHER): Payer: Medicare Other | Admitting: Nurse Practitioner

## 2020-06-12 ENCOUNTER — Encounter (INDEPENDENT_AMBULATORY_CARE_PROVIDER_SITE_OTHER): Payer: Self-pay

## 2020-06-12 ENCOUNTER — Other Ambulatory Visit: Payer: Self-pay

## 2020-06-12 VITALS — BP 126/66 | HR 108 | Resp 16 | Wt 219.0 lb

## 2020-06-12 DIAGNOSIS — L97321 Non-pressure chronic ulcer of left ankle limited to breakdown of skin: Secondary | ICD-10-CM

## 2020-06-12 NOTE — Progress Notes (Signed)
History of Present Illness  There is no documented history at this time  Assessments & Plan   There are no diagnoses linked to this encounter.    Additional instructions  Subjective:  Patient presents with venous ulcer of the Bilateral lower extremity.    Procedure:  3 layer unna wrap was placed Bilateral lower extremity.   Plan:   Follow up in one week.  

## 2020-06-13 ENCOUNTER — Encounter (INDEPENDENT_AMBULATORY_CARE_PROVIDER_SITE_OTHER): Payer: Medicare Other

## 2020-06-13 ENCOUNTER — Encounter (INDEPENDENT_AMBULATORY_CARE_PROVIDER_SITE_OTHER): Payer: Self-pay | Admitting: Nurse Practitioner

## 2020-06-14 ENCOUNTER — Telehealth: Payer: Self-pay | Admitting: Cardiovascular Disease

## 2020-06-14 DIAGNOSIS — I5032 Chronic diastolic (congestive) heart failure: Secondary | ICD-10-CM

## 2020-06-14 NOTE — Telephone Encounter (Signed)
Patient states his right leg is "weeping" and his vascular physician advised he call Dr. Mariah Milling and get his fluid medication adjusted. Please call to discuss.

## 2020-06-14 NOTE — Telephone Encounter (Signed)
Patient returning call. Please try again tomorrow.

## 2020-06-14 NOTE — Telephone Encounter (Signed)
Left voicemail message to call back  

## 2020-06-15 NOTE — Telephone Encounter (Signed)
Incoming call from patient.   He reports that he is having leg weeping, swelling and SOBOE. Pt speaks in full sentences. No audible distress noted.   Weight is stable around 206-210 (dry weight this am was 208).   He denies any inc in salt or water.   Pt seen by [facility] this week with recommendation:         Additional instructions             Subjective:     Patient presents with venous ulcer of the Bilateral lower extremity.                          Procedure:     3 layer unna wrap was placed Bilateral lower extremity.              Plan:               Follow up in one week.  He has follow up next Thursday with [facility] and has plan to meet with wound care.   He is currently on torsemide 40 BID and metolazone twice weekly. Cr this morning via care everywhere was trending up(1.5).   I made patient appt for first available next Friday to see PA. In the meantime I will route call to primary cardiologist for advice.

## 2020-06-15 NOTE — Telephone Encounter (Signed)
Patient calling back. Patient went to [place] [facility] earlier this week and had legs wrapped and they have not been unwrapped since.   Please call patient as soon as possible

## 2020-06-16 ENCOUNTER — Encounter: Payer: Self-pay | Admitting: Cardiovascular Disease

## 2020-06-16 NOTE — Telephone Encounter (Signed)
There appears to be dramatic increase in weight over the past month on review of vascular office visit notes Weight was 205 now weight is close to 220 in the past month Has he been drinking more?  Has he been compliant with the torsemide 40 twice a day with metolazone twice a week 15 pound weight gain of fluid is significant likely contributing to worsening leg swelling Needs to moderate fluid intake, Would recommend he take metolazone 3 days in a row with his torsemide 40 twice daily Take with extra potassium each day We need a new BMP, last one was several months ago This could be done next week

## 2020-06-16 NOTE — Telephone Encounter (Signed)
Call returned to discuss POC with patient.   He reports swelling is improving. Weight this morning without clothing 206 lbs.   Pt has been complaint per his report with all medications.   Pt agrees to take metolazone/torsemide extra as indicated by Dr. Mariah Milling if he sees weight go up or if sx get worse. He is working on improving dietary factors.   [facility] next Monday.   Wound clinic on Thursday and confirmed he is coming to our office on Friday.   Advised pt to call for any further questions or concerns.

## 2020-06-16 NOTE — Telephone Encounter (Signed)
This encounter was created in error - please disregard.

## 2020-06-18 ENCOUNTER — Other Ambulatory Visit: Payer: Self-pay | Admitting: Cardiovascular Disease

## 2020-06-19 ENCOUNTER — Other Ambulatory Visit
Admission: RE | Admit: 2020-06-19 | Discharge: 2020-06-19 | Disposition: A | Discharge status: Home / Self Care | Payer: Medicare Other | Source: Ambulatory Visit | Attending: Cardiovascular Disease | Admitting: Cardiovascular Disease

## 2020-06-19 DIAGNOSIS — I5032 Chronic diastolic (congestive) heart failure: Secondary | ICD-10-CM

## 2020-06-19 LAB — BASIC METABOLIC PANEL
Anion gap: 12 (ref 5–15)
BUN: 47 mg/dL — ABNORMAL HIGH (ref 8–23)
CO2: 30 mmol/L (ref 22–32)
Calcium: 8.5 mg/dL — ABNORMAL LOW (ref 8.9–10.3)
Chloride: 89 mmol/L — ABNORMAL LOW (ref 98–111)
Creatinine, Ser: 1.6 mg/dL — ABNORMAL HIGH (ref 0.61–1.24)
GFR calc Af Amer: 45 mL/min — ABNORMAL LOW (ref >60)
GFR calc non Af Amer: 38 mL/min — ABNORMAL LOW (ref >60)
Glucose, Bld: 111 mg/dL — ABNORMAL HIGH (ref 70–99)
Potassium: 3.3 mmol/L — ABNORMAL LOW (ref 3.5–5.1)
Sodium: 131 mmol/L — ABNORMAL LOW (ref 135–145)

## 2020-06-19 NOTE — Addendum Note (Signed)
Addended by: Efrain Sella on: 06/19/2020 11:22 AM   Modules accepted: Orders

## 2020-06-19 NOTE — Telephone Encounter (Signed)
Refill Request.  

## 2020-06-22 ENCOUNTER — Encounter: Payer: Medicare Other | Attending: Physician Assistant | Admitting: Physician Assistant

## 2020-06-22 ENCOUNTER — Other Ambulatory Visit: Payer: Self-pay

## 2020-06-22 DIAGNOSIS — I2510 Atherosclerotic heart disease of native coronary artery without angina pectoris: Secondary | ICD-10-CM | POA: Insufficient documentation

## 2020-06-22 DIAGNOSIS — L97822 Non-pressure chronic ulcer of other part of left lower leg with fat layer exposed: Secondary | ICD-10-CM | POA: Diagnosis not present

## 2020-06-22 DIAGNOSIS — Z952 Presence of prosthetic heart valve: Secondary | ICD-10-CM | POA: Insufficient documentation

## 2020-06-22 DIAGNOSIS — Z8614 Personal history of Methicillin resistant Staphylococcus aureus infection: Secondary | ICD-10-CM | POA: Diagnosis not present

## 2020-06-22 DIAGNOSIS — Z950 Presence of cardiac pacemaker: Secondary | ICD-10-CM | POA: Insufficient documentation

## 2020-06-22 DIAGNOSIS — G4730 Sleep apnea, unspecified: Secondary | ICD-10-CM | POA: Diagnosis not present

## 2020-06-22 DIAGNOSIS — I890 Lymphedema, not elsewhere classified: Secondary | ICD-10-CM | POA: Insufficient documentation

## 2020-06-22 DIAGNOSIS — L97812 Non-pressure chronic ulcer of other part of right lower leg with fat layer exposed: Secondary | ICD-10-CM | POA: Insufficient documentation

## 2020-06-22 DIAGNOSIS — M1990 Unspecified osteoarthritis, unspecified site: Secondary | ICD-10-CM | POA: Insufficient documentation

## 2020-06-22 DIAGNOSIS — Z833 Family history of diabetes mellitus: Secondary | ICD-10-CM | POA: Diagnosis not present

## 2020-06-22 DIAGNOSIS — I110 Hypertensive heart disease with heart failure: Secondary | ICD-10-CM | POA: Diagnosis not present

## 2020-06-22 DIAGNOSIS — I872 Venous insufficiency (chronic) (peripheral): Secondary | ICD-10-CM | POA: Diagnosis not present

## 2020-06-22 DIAGNOSIS — Z881 Allergy status to other antibiotic agents status: Secondary | ICD-10-CM | POA: Diagnosis not present

## 2020-06-22 DIAGNOSIS — I350 Nonrheumatic aortic (valve) stenosis: Secondary | ICD-10-CM | POA: Insufficient documentation

## 2020-06-22 DIAGNOSIS — Z87891 Personal history of nicotine dependence: Secondary | ICD-10-CM | POA: Insufficient documentation

## 2020-06-22 DIAGNOSIS — Z8249 Family history of ischemic heart disease and other diseases of the circulatory system: Secondary | ICD-10-CM | POA: Insufficient documentation

## 2020-06-22 DIAGNOSIS — I5042 Chronic combined systolic (congestive) and diastolic (congestive) heart failure: Secondary | ICD-10-CM | POA: Diagnosis not present

## 2020-06-22 DIAGNOSIS — Z882 Allergy status to sulfonamides status: Secondary | ICD-10-CM | POA: Diagnosis not present

## 2020-06-22 DIAGNOSIS — Z7901 Long term (current) use of anticoagulants: Secondary | ICD-10-CM | POA: Insufficient documentation

## 2020-06-22 NOTE — Telephone Encounter (Signed)
-----   Message from Antonieta Iba, MD sent at 06/19/2020  9:36 PM EDT ----- BMP Looks dehydrated perhaps with elevated BUN and CR Can we find out if he has been taking metolazone (would stop for now If taking  Hold diuretics 2 days  Weight 206 as detailed is around his normal Extra torsemide or metolazone likely caused prerenal state/dehydration with drop in sodium and potassium

## 2020-06-22 NOTE — Telephone Encounter (Signed)
Reviewed results and recommendations and he verbalized understanding and confirmed appointment for tomorrow. He was appreciative for the call back with no further questions at this time.

## 2020-06-22 NOTE — Telephone Encounter (Signed)
See other telephone encounter.

## 2020-06-23 ENCOUNTER — Other Ambulatory Visit
Admission: RE | Admit: 2020-06-23 | Discharge: 2020-06-23 | Disposition: A | Discharge status: Home / Self Care | Payer: Medicare Other | Attending: Physician Assistant | Admitting: Physician Assistant

## 2020-06-23 ENCOUNTER — Other Ambulatory Visit: Payer: Self-pay

## 2020-06-23 ENCOUNTER — Ambulatory Visit: Payer: Medicare Other | Admitting: Physician Assistant

## 2020-06-23 ENCOUNTER — Encounter: Payer: Self-pay | Admitting: Physician Assistant

## 2020-06-23 VITALS — BP 126/70 | HR 67 | Ht 71.0 in | Wt 221.4 lb

## 2020-06-23 DIAGNOSIS — R0602 Shortness of breath: Secondary | ICD-10-CM

## 2020-06-23 DIAGNOSIS — I5032 Chronic diastolic (congestive) heart failure: Secondary | ICD-10-CM

## 2020-06-23 DIAGNOSIS — I4892 Unspecified atrial flutter: Secondary | ICD-10-CM

## 2020-06-23 DIAGNOSIS — I10 Essential (primary) hypertension: Secondary | ICD-10-CM | POA: Diagnosis not present

## 2020-06-23 DIAGNOSIS — I25118 Atherosclerotic heart disease of native coronary artery with other forms of angina pectoris: Secondary | ICD-10-CM

## 2020-06-23 DIAGNOSIS — R600 Localized edema: Secondary | ICD-10-CM

## 2020-06-23 LAB — CBC
HCT: 32.1 % — ABNORMAL LOW (ref 39.0–52.0)
Hemoglobin: 10.5 g/dL — ABNORMAL LOW (ref 13.0–17.0)
MCH: 29.2 pg (ref 26.0–34.0)
MCHC: 32.7 g/dL (ref 30.0–36.0)
MCV: 89.4 fL (ref 80.0–100.0)
Platelets: 258 10*3/uL (ref 150–400)
RBC: 3.59 MIL/uL — ABNORMAL LOW (ref 4.22–5.81)
RDW: 15 % (ref 11.5–15.5)
WBC: 8 10*3/uL (ref 4.0–10.5)
nRBC: 0 % (ref 0.0–0.2)

## 2020-06-23 LAB — MAGNESIUM: Magnesium: 2.4 mg/dL (ref 1.7–2.4)

## 2020-06-23 LAB — BASIC METABOLIC PANEL
Anion gap: 11 (ref 5–15)
BUN: 45 mg/dL — ABNORMAL HIGH (ref 8–23)
CO2: 31 mmol/L (ref 22–32)
Calcium: 8.7 mg/dL — ABNORMAL LOW (ref 8.9–10.3)
Chloride: 89 mmol/L — ABNORMAL LOW (ref 98–111)
Creatinine, Ser: 1.69 mg/dL — ABNORMAL HIGH (ref 0.61–1.24)
GFR calc Af Amer: 42 mL/min — ABNORMAL LOW (ref >60)
GFR calc non Af Amer: 36 mL/min — ABNORMAL LOW (ref >60)
Glucose, Bld: 115 mg/dL — ABNORMAL HIGH (ref 70–99)
Potassium: 3.5 mmol/L (ref 3.5–5.1)
Sodium: 131 mmol/L — ABNORMAL LOW (ref 135–145)

## 2020-06-23 LAB — ALBUMIN: Albumin: 3.5 g/dL (ref 3.5–5.0)

## 2020-06-23 NOTE — Progress Notes (Signed)
Office Visit    Patient Name: Kwamaine Cuppett Date of Encounter: 06/25/2020  Primary Care Provider:  Gracelyn Nurse, MD Primary Cardiologist:  Julien Nordmann, MD  Chief Complaint    No chief complaint on file.  86 year old male with history of permanent atrial fibrillation, aortic stenosis s/p mechanical aortic valve replacement on chronic Coumadin, chronic diastolic CHF, nonobstructive CAD, hypertension, hyperlipidemia, GERD, chronic dyspnea, chronic venous stasis, and who presents for follow-up of recent weight gain and volume excess with report of lower extremity edema.  Past Medical History    Past Medical History:  Diagnosis Date  . Anxiety 10/11  . Aortic stenosis    a. s/p mechcanical AVR, 2002; b. 10/2018 Echo: Triv AI, mean grad .  . Bradycardia    chronic, no symptoms 07/2010  . C. difficile colitis   . Carotid bruit    dopplers in past, no abnormalities  . Chronic diastolic CHF (congestive heart failure) (HCC)    a. Echo 03/2015: EF 60-65%, no RWMA, GR1DD, mild BAE, mild to mod MR, mod TR, PASP 65 mmHg; b. 01/2017 Echo: EF 55-60%, NRWMA, grade 1 diastolic dysfunction.  Normal functioning prosthetic aortic valve.  Mean gradient 50 mmHg.  Sev TR. PASP ; c. 10/2018 Echo: EF 55-60%, Triv AI, mod dil LA, mod-sev TR, PASP 35-40, mild to mod red RV fxn.  . Coronary artery disease    a. mild, cath, 08/2010; b. medically managed  . Decreased hearing    Right ear  . Depression   . Gastric ulcer   . GERD (gastroesophageal reflux disease)   . Hypertension    BP higher than usual 04/19/10; amlodipine increased by telephone  . Mod-Sev Tricuspid regurgitation    a. 10/2018 Echo: Mod-Sev TR, PASP 35-9mmHg.  Marland Kitchen RLS (restless legs syndrome) 08/23/2015  . S/P AVR    a. [employer]. mechanical 2002; b. echo 08/2010 EF 60%, trival AI, mild MR, AVR working well; c. on longterm warfarin tx  . SOB (shortness of breath) 10/11   08/2010,Episodes at 5 AM, eventually felt to  be anxiety, after complete workup including catheterization, pt greatly improved with anxiety meds 11/11   Past Surgical History:  Procedure Laterality Date  . CARDIAC CATHETERIZATION    . ESOPHAGOGASTRODUODENOSCOPY N/A 04/05/2015   Procedure: ESOPHAGOGASTRODUODENOSCOPY (EGD);  Surgeon: Scot Jun, MD;  Location: [facility] ENDOSCOPY;  Service: Endoscopy;  Laterality: N/A;  . ESOPHAGOGASTRODUODENOSCOPY N/A 04/17/2015   Procedure: ESOPHAGOGASTRODUODENOSCOPY (EGD);  Surgeon: Scot Jun, MD;  Location: [facility] ENDOSCOPY;  Service: Endoscopy;  Laterality: N/A;  . ESOPHAGOGASTRODUODENOSCOPY N/A 08/02/2015   Procedure: ESOPHAGOGASTRODUODENOSCOPY (EGD);  Surgeon: Wallace Cullens, MD;  Location[facility] ENDOSCOPY;  Service: Endoscopy;  Laterality: N/A;  . HERNIA REPAIR    . JOINT REPLACEMENT    . TOTAL HIP ARTHROPLASTY    . VALVE REPLACEMENT  1/02   Aortic; echo 3/09 valve working well; echo 10/11 working well; put on Coumadin    Allergies  Allergies  Allergen Reactions  . Levofloxacin Nausea Only  . Sulfa Antibiotics Other (See Comments)    Reaction:  Unknown     History of Present Illness    Dammon Carlton Buskey is a 86 y.o. male with PMH as above.  He has complex medical history as above including mechanical AVR and chronic Coumadin anticoagu permanent A. fib, diastolic CHF, hypertension, hyperlipidemia, dyspnea, GERD, depression, and chronic venous stasis.  In September 2020, he noticed more significant LAE.  His torsemide was adjusted to 40 mg  twice daily.  Metolazone was added up to 3 times per week.  He was seen in November 2020.  His weight was down to 206 pounds.  He was maintained on his current diuretic doses.  Since then, he has continued to have increase in abdominal girth with left greater than right lower extremity swelling in the setting of venous stasis and varicose veins.  He is followed by vascular surgery.    At his visit 12/2019, he reported stable/chronic DOE.  His weight was  trending about 195 pounds on his home scale.  He was 208.4 pounds on the clinic scale.  He was using metolazone once a week.  He was questioning if he could use it a second day a week.  Recent labs were performed through his PCP with patient reporting that everything looked okay.  Plan was to increase to a second day of metolazone with follow-up labs in 1 week.  He was seen 02/16/2020 and with report that weight was stable on current medication regimen.  He appears euvolemic and renal function stable.  Recommendation was to stand torsemide 40 mg twice daily with metolazone 2.5 mg twice a week.  On 7/23, he called the office with report of weight 205lbs.  He was advised to take metolazone and torsemide and extra doses as indicated by Dr. Welton Flakes.  Recommendation was to take metolazone 3 days in a row with torsemide 40 mg twice daily and an extra potassium each day.  Repeat BMET was performed with patient dry on BMET.  He presents to clinic today and reports continued lower extremity edema and abdominal distention.  He reports significant leg cramping, noting the worse started at 4 AM this morning.  He reports holding his fluid pills since recommended to do so.  However, after end of clinic and when calling in lab results later today, it was revealed that he has only been holding his fluid pills since 7/29.  He reports increasing the head of the bed approximately 4 inches for reflux with use of one pillow.  He has noted weight increase and at times increase in breathing/difficulty breathing.  He reports that he lives at the [facility] and relies on them for his food.  He denies any canned soup but he does report that he sometimes salts his potato.  He reports 6 glasses of water per day plus soft drinks. He denies chest pain, palpitations,, pnd, orthopnea, n, v, dizziness, syncope, ,  or early satiety. He reports bilateral pain of lower extremities, wrapped by wound clinic today.  Home Medications      Prior to Admission medications   Medication Sig Start Date End Date Taking? Authorizing Provider  allopurinol (ZYLOPRIM) 300 MG tablet One tab daily, 90 tabs 05/16/20  Yes [provider]  cephALEXin (KEFLEX) 500 MG capsule Take 1 capsule (500 mg total) by mouth 3 (three) times daily. 04/20/20  Yes Georgiana Spinner, NP  Cetirizine HCl 10 MG CAPS Take 10 mg by mouth daily.   Yes [provider]  citalopram (CELEXA) 20 MG tablet Take 20 mg by mouth daily.   Yes [provider]  cloNIDine (CATAPRES) 0.1 MG tablet TAKE 1 TABLET BY MOUTH  TWICE DAILY 01/05/20  Yes Gollan, Tollie Pizza, MD  doxazosin (CARDURA) 8 MG tablet  02/09/20  Yes [provider]  finasteride (PROSCAR) 5 MG tablet Take 5 mg by mouth daily.     Yes [provider]  isosorbide mononitrate (IMDUR) 60 MG 24  hr tablet Take 1 tablet (60 mg total) by mouth 2 (two) times daily. 05/05/20  Yes Creig Hines, NP  metoprolol succinate (TOPROL-XL) 25 MG 24 hr tablet Take 0.5 tablets (12.5 mg total) by mouth daily. 03/06/20  Yes Gollan, Tollie Pizza, MD  potassium chloride (KLOR-CON) 10 MEQ tablet TAKE 3 TABLETS (30 MEQ) BY MOUTH DAILY. TAKE AN ADDITIONAL 2TABS ON THE DAYS YOU TAKE METOLAZONE 06/08/20  Yes Gollan, Tollie Pizza, MD  rOPINIRole (REQUIP) 2 MG tablet TAKE 1 TABLET BY MOUTH 4  TIMES DAILY 09/14/18  Yes Butch Penny, NP  Tamsulosin HCl (FLOMAX) 0.4 MG CAPS Take 0.4 mg by mouth at bedtime.    Yes [provider]  torsemide (DEMADEX) 20 MG tablet TAKE 2 TABLETS BY MOUTH  TWICE DAILY 04/05/20  Yes Gollan, Tollie Pizza, MD  traMADol (ULTRAM) 50 MG tablet Take 50 mg by mouth 2 (two) times daily as needed. 11/02/19  Yes [provider]  traZODone (DESYREL) 50 MG tablet Take 50 mg by mouth at bedtime.   Yes [provider]  warfarin (COUMADIN) 5 MG tablet TAKE 1 TABLET BY MOUTH  DAILY OR AS DIRECTED BY THE ANTICOAGULATION CLINIC 06/19/20  Yes Gollan, Tollie Pizza, MD  metolazone  (ZAROXOLYN) 2.5 MG tablet Take 1 tablet (2.5 mg total) by mouth 2 (two) times a week. 02/17/20 05/17/20  Antonieta Iba, MD    Review of Systems   He denies chest pain, palpitations, pnd, orthopnea, n, v, dizziness, syncope, or early satiety.  He reports dyspnea/SIB, edema, weight gain, abdominal distention, and bilateral lower extremity pain.  All other systems reviewed and are otherwise negative except as noted above.  Physical Exam    VS:  BP 126/70   Pulse 67   Ht 5\' 11"  (1.803 m)   Wt (!) 221 lb 6.4 oz (100.4 kg)   SpO2 99%   BMI 30.88 kg/m  , BMI Body mass index is 30.88 kg/m. GEN: Obese male, in no acute distress. HEENT: normal. Neck: Supple, no JVD, carotid bruits, or masses. Cardiac: IRIR, no murmurs, rubs, or gallops. No clubbing, cyanosis. Cannot ascertain edema 2/2 wrapped LE after the wound clinic.  Radials/DP/PT 2+ and equal bilaterally.  Respiratory:  Respirations regular and unlabored, clear to auscultation bilaterally. GI: Soft, nontender, nondistended, BS + x 4. MS: no deformity or atrophy. Skin: warm and dry, no rash. Neuro:  Strength and sensation are intact. Psych: Normal affect.  Accessory Clinical Findings    ECG personally reviewed by me today -atrial fibrillation with controlled ventricular rate of 67 bpm and right bundle branch block- no acute changes.  VITALS Reviewed today   Temp Readings from Last 3 Encounters:  02/09/19 97.6 F (36.4 C) (Oral)  12/28/18 97.8 F (36.6 C)  11/24/18 (!) 97.5 F (36.4 C) (Oral)   BP Readings from Last 3 Encounters:  06/23/20 126/70  06/12/20 126/66  06/09/20 133/66   Pulse Readings from Last 3 Encounters:  06/23/20 67  06/12/20 (!) 108  06/09/20 93    Wt Readings from Last 3 Encounters:  06/23/20 (!) 221 lb 6.4 oz (100.4 kg)  06/12/20 219 lb (99.3 kg)  06/06/20 221 lb (100.2 kg)     LABS  reviewed today   Lab Results  Component Value Date   WBC 8.0 06/23/2020   HGB 10.5 (L) 06/23/2020   HCT  32.1 (L) 06/23/2020   MCV 89.4 06/23/2020   PLT 258 06/23/2020   Lab Results  Component Value Date  CREATININE 1.69 (H) 06/23/2020   BUN 45 (H) 06/23/2020   NA 131 (L) 06/23/2020   K 3.5 06/23/2020   CL 89 (L) 06/23/2020   CO2 31 06/23/2020   Lab Results  Component Value Date   ALT 15 (L) 02/22/2017   AST 23 02/22/2017   ALKPHOS 67 02/22/2017   BILITOT 0.9 02/22/2017   No results found for: CHOL, HDL, LDLCALC, LDLDIRECT, TRIG, CHOLHDL  Lab Results  Component Value Date   HGBA1C 5.3 10/31/2014   Lab Results  Component Value Date   TSH 3.090 02/18/2017     STUDIES/PROCEDURES reviewed today   Echo 01/2020 1. Left ventricular ejection fraction, by estimation, is 55 to 60%. The  left ventricle has normal function. The left ventricle has no regional  wall motion abnormalities. Left ventricular diastolic function could not  be evaluated.  2. Right ventricular systolic function is normal. The right ventricular  size is mildly enlarged.  3. Left atrial size was severely dilated.  4. Right atrial size was severely dilated.  5. The mitral valve is degenerative. Mild mitral valve regurgitation.  6. Tricuspid valve regurgitation is moderate to severe.  7. The aortic valve has been repaired/replaced. Aortic valve  regurgitation is not visualized. There is a mechanical valve present in  the aortic position. Procedure Date: 2002. Echo findings are consistent  with normal structure and function of the aortic  valve prosthesis.   Assessment & Plan    Acute on chronic diastolic congestive heart failure --Reports progressive heart failure symptoms, including lower extremity edema and dyspnea.  EF 55 to 60% by echo 10/2018.  He reports increasing volume status and weight.  He has been taking his torsemide 40 mg daily with metolazone 2.5 mg escalated and most recently taken multiple times in a row.  Most recent BMET shows AKI and thus he presents to clinic today.  Will repeat  labs today.  If renal function has recovered since holding his diuretics, outpatient diuresis can continue.  Would recommend an updated echo as well.  If renal function continues to decline, he will need admission with close monitoring of his renal function during IV diuresis. Update: renal function with AKI. Pt states only holding diuretics 1/2 day now. Will allow him to continue to hold his diuretics over the weekend. Repeat BMET on Monday. Reassess at that time. If sx worsen over the weekend, he will present to the ED. could also consider right heart cath if continue to struggle with hemodynamics and diuresis.  Essential HTN --BP relatively controlled.  Continue current medications.  Nonobstructive CAD --No chest pain.  Will update echo to assist with volume status.  Consider right heart cath if continue to struggle with volume status.  Continue current medications.  Permanent atrial fibrillation --Rate controlled.  Continue anticoagulation with warfarin.  S/p aortic valve replacement --We will update echo.  Continue Coumadin.   Disposition: Update after office visit: Repeat BMET showes renal function with AKI. Pt states only holding diuretics 1/2 day, which is new information. Will have hold his diuretics over the weekend. Repeat BMET on Monday. Reassess renal function at that time. If volume status / breathing worsens over the weekend, he will present to the ED. Otherwise, will have further recommendations on Monday. If renal function still declining on Monday, consider admission, echo, right heart cath if continue to struggle with hemodynamics and diuresis. Will order BMET, CBC, albumin, MG for further information.   Lennon AlstromJacquelyn D Jasmain Ahlberg, PA-C 06/25/2020

## 2020-06-23 NOTE — Patient Instructions (Signed)
Medication Instructions:  No medication changes today  *If you need a refill on your cardiac medications before your next appointment, please call your pharmacy*  Lab Work: Your physician lab work today: BMP, magnesium, CBC, albumin  Please stop by the [facility] on your way out to have your labs collected.  If you have labs (blood work) drawn today and your tests are completely normal, you will receive your results only by: Marland Kitchen MyChart Message (if you have MyChart) OR . A paper copy in the mail If you have any lab test that is abnormal or we need to change your treatment, we will call you to review the results.   Testing/Procedures: None ordered today.   Follow-Up: At [facility], you and your health needs are our priority.  As part of our continuing mission to provide you with exceptional heart care, we have created designated Provider Care Teams.  These Care Teams include your primary Cardiologist (physician) and Advanced Practice Providers (APPs -  Physician Assistants and Nurse Practitioners) who all work together to provide you with the care you need, when you need it.  We recommend signing up for the patient portal called "MyChart".  Sign up information is provided on this After Visit Summary.  MyChart is used to connect with patients for Virtual Visits (Telemedicine).  Patients are able to view lab/test results, encounter notes, upcoming appointments, etc.  Non-urgent messages can be sent to your provider as well.   To learn more about what you can do with MyChart, go to [email or web address].    Your next appointment:   1 week(s)  The format for your next appointment:   In Person  Provider:   You may see Julien Nordmann, MD or one of the following Advanced Practice Providers on your designated Care Team:    Nicolasa Ducking, NP  Eula Listen, PA-C  [name], PA-C

## 2020-06-26 ENCOUNTER — Other Ambulatory Visit: Payer: Self-pay

## 2020-06-26 ENCOUNTER — Emergency Department
Admission: EM | Admit: 2020-06-26 | Discharge: 2020-06-26 | Disposition: A | Discharge status: Home / Self Care | Payer: Medicare Other | Attending: Emergency Medicine | Admitting: Emergency Medicine

## 2020-06-26 ENCOUNTER — Emergency Department: Payer: Medicare Other

## 2020-06-26 ENCOUNTER — Other Ambulatory Visit: Payer: Self-pay | Admitting: *Deleted

## 2020-06-26 ENCOUNTER — Ambulatory Visit (INDEPENDENT_AMBULATORY_CARE_PROVIDER_SITE_OTHER): Payer: Medicare Other

## 2020-06-26 ENCOUNTER — Encounter: Payer: Self-pay | Admitting: Emergency Medicine

## 2020-06-26 ENCOUNTER — Other Ambulatory Visit
Admission: RE | Admit: 2020-06-26 | Discharge: 2020-06-26 | Disposition: A | Discharge status: Home / Self Care | Payer: Medicare Other | Source: Home / Self Care | Attending: Physician Assistant | Admitting: Physician Assistant

## 2020-06-26 DIAGNOSIS — R791 Abnormal coagulation profile: Secondary | ICD-10-CM | POA: Insufficient documentation

## 2020-06-26 DIAGNOSIS — I2510 Atherosclerotic heart disease of native coronary artery without angina pectoris: Secondary | ICD-10-CM | POA: Diagnosis not present

## 2020-06-26 DIAGNOSIS — F17210 Nicotine dependence, cigarettes, uncomplicated: Secondary | ICD-10-CM | POA: Insufficient documentation

## 2020-06-26 DIAGNOSIS — I110 Hypertensive heart disease with heart failure: Secondary | ICD-10-CM | POA: Insufficient documentation

## 2020-06-26 DIAGNOSIS — Z79899 Other long term (current) drug therapy: Secondary | ICD-10-CM | POA: Insufficient documentation

## 2020-06-26 DIAGNOSIS — Z7901 Long term (current) use of anticoagulants: Secondary | ICD-10-CM | POA: Insufficient documentation

## 2020-06-26 DIAGNOSIS — I350 Nonrheumatic aortic (valve) stenosis: Secondary | ICD-10-CM

## 2020-06-26 DIAGNOSIS — R600 Localized edema: Secondary | ICD-10-CM | POA: Diagnosis not present

## 2020-06-26 DIAGNOSIS — I5032 Chronic diastolic (congestive) heart failure: Secondary | ICD-10-CM

## 2020-06-26 DIAGNOSIS — Z952 Presence of prosthetic heart valve: Secondary | ICD-10-CM | POA: Diagnosis not present

## 2020-06-26 DIAGNOSIS — Z5181 Encounter for therapeutic drug level monitoring: Secondary | ICD-10-CM | POA: Diagnosis not present

## 2020-06-26 LAB — CBC WITH DIFFERENTIAL/PLATELET
Abs Immature Granulocytes: 0.05 10*3/uL (ref 0.00–0.07)
Basophils Absolute: 0 10*3/uL (ref 0.0–0.1)
Basophils Relative: 0 %
Eosinophils Absolute: 0.1 10*3/uL (ref 0.0–0.5)
Eosinophils Relative: 1 %
HCT: 31.5 % — ABNORMAL LOW (ref 39.0–52.0)
Hemoglobin: 10.8 g/dL — ABNORMAL LOW (ref 13.0–17.0)
Immature Granulocytes: 1 %
Lymphocytes Relative: 9 %
Lymphs Abs: 0.9 10*3/uL (ref 0.7–4.0)
MCH: 29.8 pg (ref 26.0–34.0)
MCHC: 34.3 g/dL (ref 30.0–36.0)
MCV: 86.8 fL (ref 80.0–100.0)
Monocytes Absolute: 0.6 10*3/uL (ref 0.1–1.0)
Monocytes Relative: 6 %
Neutro Abs: 7.8 10*3/uL — ABNORMAL HIGH (ref 1.7–7.7)
Neutrophils Relative %: 83 %
Platelets: 288 10*3/uL (ref 150–400)
RBC: 3.63 MIL/uL — ABNORMAL LOW (ref 4.22–5.81)
RDW: 15.4 % (ref 11.5–15.5)
WBC: 9.5 10*3/uL (ref 4.0–10.5)
nRBC: 0 % (ref 0.0–0.2)

## 2020-06-26 LAB — BASIC METABOLIC PANEL
Anion gap: 9 (ref 5–15)
BUN: 42 mg/dL — ABNORMAL HIGH (ref 8–23)
CO2: 28 mmol/L (ref 22–32)
Calcium: 8.5 mg/dL — ABNORMAL LOW (ref 8.9–10.3)
Chloride: 94 mmol/L — ABNORMAL LOW (ref 98–111)
Creatinine, Ser: 1.27 mg/dL — ABNORMAL HIGH (ref 0.61–1.24)
GFR calc Af Amer: 59 mL/min — ABNORMAL LOW (ref >60)
GFR calc non Af Amer: 51 mL/min — ABNORMAL LOW (ref >60)
Glucose, Bld: 107 mg/dL — ABNORMAL HIGH (ref 70–99)
Potassium: 3.8 mmol/L (ref 3.5–5.1)
Sodium: 131 mmol/L — ABNORMAL LOW (ref 135–145)

## 2020-06-26 LAB — PROTIME-INR
INR: 7 (ref 0.8–1.2)
Prothrombin Time: 58.6 seconds — ABNORMAL HIGH (ref 11.4–15.2)

## 2020-06-26 LAB — POCT INR: INR: 8 — AB (ref 2.0–3.0)

## 2020-06-26 MED ORDER — PHYTONADIONE 5 MG PO TABS
2.5000 mg | ORAL_TABLET | Freq: Once | ORAL | Status: AC
  Administered 2020-06-26: 2.5 mg via ORAL
  Filled 2020-06-26: qty 1

## 2020-06-26 MED ORDER — FUROSEMIDE 10 MG/ML IJ SOLN: 60.0000 mg | Freq: Once | INTRAMUSCULAR | Status: DC

## 2020-06-26 NOTE — ED Triage Notes (Signed)
Pt in via POV, sent over from Cardiology due to acute fluid overload.  Pt reports approximate 20lb weight gain over the last couple of weeks.  Also reports recently being taken off of diuretic due to kidney function.  Blood work performed prior to arrival; results available in [employer].    Pt A/Ox4, vitals WDL, NAD noted at this time.

## 2020-06-26 NOTE — ED Notes (Signed)
Hold on all labwork and med per EDP

## 2020-06-26 NOTE — Patient Instructions (Addendum)
Please proceed to the [facility] for [facility] labs. I will call you with instructions when I get your results back. Recheck INR next week at your appt w/ Caitlyn.  Received lab results- pt going to ED - please see note.

## 2020-06-26 NOTE — ED Provider Notes (Signed)
[facility] Emergency Department Provider Note  Time seen: 7:04 PM  I have reviewed the triage vital signs and the nursing notes.   HISTORY  Chief Complaint Supratherapeutic INR  HPI [name] is a 86 y.o. male with a past medical history of CHF, cardiac valve replacement on warfarin, hypertension, presents to the emergency department for an elevated INR.  According to the patient and daughter they were being seen by their cardiologist Dr. Ethelene HalGollans PA who recommended they come to the emergency department for evaluation given the elevated INR level.  According to report as well as the daughter patient's kidney function had been decreasing over the past week and the patient's torsemide was stopped this past Friday.  Patient return to see Dr. Mariah MillingGollan today and was found to have an elevated INR greater than 7 and was sent to the emergency department.  They also noted that the patient has gained approximately 5 pounds since stopping the torsemide and they have noticed increased swelling to the legs.  Past Medical History:  Diagnosis Date  . Anxiety 10/11  . Aortic stenosis    a. s/p mechcanical AVR, 2002; b. 10/2018 Echo: Triv AI, mean grad 9mmHg.  . Bradycardia    chronic, no symptoms 07/2010  . C. difficile colitis   . Carotid bruit    dopplers in past, no abnormalities  . Chronic diastolic CHF (congestive heart failure) (HCC)    a. Echo 03/2015: EF 60-65%, no RWMA, GR1DD, mild BAE, mild to mod MR, mod TR, PASP 65 mmHg; b. 01/2017 Echo: EF 55-60%, NRWMA, grade 1 diastolic dysfunction.  Normal functioning prosthetic aortic valve.  Mean gradient 50 mmHg.  Sev TR. PASP 54mmHg; c. 10/2018 Echo: EF 55-60%, Triv AI, mod dil LA, mod-sev TR, PASP 35-40, mild to mod red RV fxn.  . Coronary artery disease    a. mild, cath, 08/2010; b. medically managed  . Decreased hearing    Right ear  . Depression   . Gastric ulcer   . GERD (gastroesophageal reflux disease)   .  Hypertension    BP higher than usual 04/19/10; amlodipine increased by telephone  . Mod-Sev Tricuspid regurgitation    a. 10/2018 Echo: Mod-Sev TR, PASP 35-8840mmHg.  Marland Kitchen. RLS (restless legs syndrome) 08/23/2015  . S/P AVR    a. [employer]. mechanical 2002; b. echo 08/2010 EF 60%, trival AI, mild MR, AVR working well; c. on longterm warfarin tx  . SOB (shortness of breath) 10/11   08/2010,Episodes at 5 AM, eventually felt to be anxiety, after complete workup including catheterization, pt greatly improved with anxiety meds 11/11    Patient Active Problem List   Diagnosis Date Noted  . 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 Varicose veins of leg with swelling, right 02/08/2020  . 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 Greater trochanteric pain syndrome 11/02/2019  . Lower limb ulcer, ankle, left, limited to breakdown of skin (HCC) 11/02/2019  . H/O atrial flutter 07/15/2018  . Obstructive sleep apnea 10/23/2017  . Chronic diastolic CHF (congestive heart failure) (HCC) 09/24/2017  . Lymphedema 09/24/2017  . Chronic hyponatremia 08/12/2017  . Encounter for anticoagulation discussion and counseling 02/24/2017  . Bilateral leg edema 09/25/2015  . RLS (restless legs syndrome) 08/23/2015  . C. difficile colitis 05/26/2015  . Blood loss anemia   . S/P AVR (aortic valve replacement)   . Anemia 04/02/2015  . GERD (gastroesophageal reflux disease)   .  Bradycardia   . Depression   . Anxiety   . Hypertension   . Decreased hearing   . Coronary artery disease   . Aortic stenosis   . Warfarin anticoagulation   . Carotid bruit     Past Surgical History:  Procedure Laterality Date  . CARDIAC CATHETERIZATION    . ESOPHAGOGASTRODUODENOSCOPY N/A 04/05/2015   Procedure: ESOPHAGOGASTRODUODENOSCOPY (EGD);  Surgeon: Scot Jun, MD;  Location: [facility] ENDOSCOPY;  Service: Endoscopy;  Laterality: N/A;  . ESOPHAGOGASTRODUODENOSCOPY N/A 04/17/2015    Procedure: ESOPHAGOGASTRODUODENOSCOPY (EGD);  Surgeon: Scot Jun, MD;  Location: [facility] ENDOSCOPY;  Service: Endoscopy;  Laterality: N/A;  . ESOPHAGOGASTRODUODENOSCOPY N/A 08/02/2015   Procedure: ESOPHAGOGASTRODUODENOSCOPY (EGD);  Surgeon: Wallace Cullens, MD;  Location: [facility] ENDOSCOPY;  Service: Endoscopy;  Laterality: N/A;  . HERNIA REPAIR    . JOINT REPLACEMENT    . TOTAL HIP ARTHROPLASTY    . VALVE REPLACEMENT  1/02   Aortic; echo 3/09 valve working well; echo 10/11 working well; put on Coumadin    Prior to Admission medications   Medication Sig Start Date End Date Taking? Authorizing Provider  allopurinol (ZYLOPRIM) 300 MG tablet One tab daily, 90 tabs 05/16/20   [provider]  cephALEXin (KEFLEX) 500 MG capsule Take 1 capsule (500 mg total) by mouth 3 (three) times daily. 04/20/20   Georgiana Spinner, NP  Cetirizine HCl 10 MG CAPS Take 10 mg by mouth daily.    [provider]  citalopram (CELEXA) 20 MG tablet Take 20 mg by mouth daily.    [provider]  cloNIDine (CATAPRES) 0.1 MG tablet TAKE 1 TABLET BY MOUTH  TWICE DAILY 01/05/20   Antonieta Iba, MD  doxazosin (CARDURA) 8 MG tablet  02/09/20   [provider]  finasteride (PROSCAR) 5 MG tablet Take 5 mg by mouth daily.      [provider]  isosorbide mononitrate (IMDUR) 60 MG 24 hr tablet Take 1 tablet (60 mg total) by mouth 2 (two) times daily. 05/05/20   Creig Hines, NP  metolazone (ZAROXOLYN) 2.5 MG tablet Take 1 tablet (2.5 mg total) by mouth 2 (two) times a week. 02/17/20 05/17/20  Antonieta Iba, MD  metoprolol succinate (TOPROL-XL) 25 MG 24 hr tablet Take 0.5 tablets (12.5 mg total) by mouth daily. 03/06/20   Antonieta Iba, MD  potassium chloride (KLOR-CON) 10 MEQ tablet TAKE 3 TABLETS (30 MEQ) BY MOUTH DAILY. TAKE AN ADDITIONAL 2TABS ON THE DAYS YOU TAKE METOLAZONE 06/08/20   Gollan, Tollie Pizza, MD  rOPINIRole (REQUIP) 2 MG tablet TAKE 1 TABLET BY MOUTH 4  TIMES DAILY  09/14/18   Butch Penny, NP  Tamsulosin HCl (FLOMAX) 0.4 MG CAPS Take 0.4 mg by mouth at bedtime.     [provider]  torsemide (DEMADEX) 20 MG tablet TAKE 2 TABLETS BY MOUTH  TWICE DAILY 04/05/20   Antonieta Iba, MD  traMADol (ULTRAM) 50 MG tablet Take 50 mg by mouth 2 (two) times daily as needed. 11/02/19   [provider]  traZODone (DESYREL) 50 MG tablet Take 50 mg by mouth at bedtime.    [provider]  warfarin (COUMADIN) 5 MG tablet TAKE 1 TABLET BY MOUTH  DAILY OR AS DIRECTED BY THE ANTICOAGULATION CLINIC 06/19/20   Antonieta Iba, MD    Allergies  Allergen Reactions  . Sulfa Antibiotics Other (See Comments)    Reaction:  Unknown   . Levofloxacin Nausea Only    Family History  Problem Relation Age of Onset  . Hypertension Mother   . Diabetes type II Mother   . Heart disease Mother   . Heart attack Mother   . Hypertension Father   . Heart disease Father   . Stroke Father   . Stroke Brother   . Stroke Brother   . Hypertension Other     Social History Social History   Tobacco Use  . Smoking status: Former Smoker    Types: Cigarettes    Quit date: 1979    Years since quitting: 42.6  . Smokeless tobacco: Never Used  Vaping Use  . Vaping Use: Never used  Substance Use Topics  . Alcohol use: Yes    Alcohol/week: 3.0 standard drinks    Types: 3 Glasses of wine per week  . Drug use: No    Review of Systems Constitutional: Negative for fever. Cardiovascular: Negative for chest pain. Respiratory: Negative for shortness of breath. Gastrointestinal: Negative for abdominal pain Musculoskeletal: Lower extremity swelling, chronic. Neurological: Negative for headache All other ROS negative  ____________________________________________   PHYSICAL EXAM:  VITAL SIGNS: ED Triage Vitals  Enc Vitals Group     BP 06/26/20 1443 127/65     Pulse Rate 06/26/20 1443 80     Resp 06/26/20 1443 20     Temp 06/26/20 1443 98.7 F (37.1 C)      Temp Source 06/26/20 1443 Oral     SpO2 06/26/20 1443 99 %     Weight 06/26/20 1443 226 lb (102.5 kg)     Height 06/26/20 1443 5\' 7"  (1.702 m)     Head Circumference --      Peak Flow --      Pain Score 06/26/20 1450 0     Pain Loc --      Pain Edu? --      Excl. in GC? --     Constitutional: Alert and oriented. Well appearing and in no distress. Eyes: Normal exam ENT      Head: Normocephalic and atraumatic.      Mouth/Throat: Mucous membranes are moist. Cardiovascular: Normal rate, regular rhythm.  Respiratory: Normal respiratory effort without tachypnea nor retractions. Breath sounds are clear  Gastrointestinal: Soft and nontender. No distention Musculoskeletal: Patient has 2+ edema to lower extremities.  Currently wrapped, nontender.  Patient states possibly slightly increased over baseline. Neurologic:  Normal speech and language. No gross focal neurologic deficits  Skin:  Skin is warm, dry and intact.  Psychiatric: Mood and affect are normal.    INITIAL IMPRESSION / ASSESSMENT AND PLAN / ED COURSE  Pertinent labs & imaging results that were available during my care of the patient were reviewed by me and considered in my medical decision making (see chart for details).   Patient presents emergency department for supratherapeutic INR, sent from cardiology office for evaluation.  Currently patient appears well, he denies any acute complaints denies any increase in shortness of breath.  Patient satting 97 to 100% on room air.  Patient was ambulated and pulse ox remained at 97% or higher throughout ambulation.  Patient states he is not sure why they sent him, daughter thought it was for increased fluid retention.  Patient's labs today show kidney function is improved, but his INR was elevated to 7.  Here as the patient appears well with improved INR he states several pound weight gain over the weekend we will restart the torsemide starting tomorrow.  Patient's INR being greater  than 7 we will dose  2.5 mg of oral vitamin K and have the patient stop his Coumadin for the next 3 days and follow-up with Dr. Mariah Milling on Thursday for recheck reevaluation recheck his lab work.  Patient and daughter agreeable to this plan of care.  Patient strongly wishes to be discharged home.  I discussed return precautions.  [name] was evaluated in Emergency Department on 06/26/2020 for the symptoms described in the history of present illness. He was evaluated in the context of the global COVID-19 pandemic, which necessitated consideration that the patient might be at risk for infection with the SARS-CoV-2 virus that causes COVID-19. Institutional protocols and algorithms that pertain to the evaluation of patients at risk for COVID-19 are in a state of rapid change based on information released by regulatory bodies including the CDC and federal and state organizations. These policies and algorithms were followed during the patient's care in the ED.  ____________________________________________   FINAL CLINICAL IMPRESSION(S) / ED DIAGNOSES  Supratherapeutic INR   Minna Antis, MD 06/26/20 1912

## 2020-06-26 NOTE — Progress Notes (Signed)
Per staff message from [name], RN, obtained pt's wt today: 226.4 Pt presents w/ his daughter [name] today. Pt normally walks by himself to visits w/ rolling walker, but he is in wheelchair today. [name] feet are wrapped in gauze & ACE bandages, so unable to visualize any swelling.   [name] reports that pt's appetite has been poor recently, he has not wanted to eat much, esp his greens. She will try to get him to start Ensure or Boost and check the Vit K of these. Provided her w/ list of Vit k foods and discussed serving sizes w/ her. Pt is HOH and let his daughter do most of the talking today, but he is very pleasant and jovial.   [facility].  Per pt, gave verbal order to call [name] at [phone] to discuss results. He states that is under the impression that she is on DPR, but pt needs to get to the lab STAT, so we did not stop at front desk to verify status.  Received call from [facility] lab w/ INR result of 7.0. Spoke w/ [name], PA and due to pt's wt gain and other co-morbidities, recommend that pt proceed to ED for mgmt.  Spoke w/ pt's daughter [name] and advised her of this.  She states that she "knew this was coming" and will take pt to ED.  She asks that since the wait at the [facility] is long, if pt could proceed to his scheduled dental appt for filling - advised against this since pt's INR is so high. Offered to call [place] to see if wait time is shorter, but she declines as this is so far away.  She will take pt to [facility] ED now.

## 2020-06-26 NOTE — Discharge Instructions (Addendum)
As we discussed please begin taking your torsemide starting tomorrow morning.  Please do not take your Coumadin/warfarin until seeing your doctor on Thursday for recheck of your labs.  You may restart Thursday evening as long as your INR/Coumadin level is back in the recommended range.  Please follow-up with your doctor for recheck on Thursday to recheck your labs including your kidney function and INR.  Return to the emergency department for any shortness of breath, or any other symptom personally concerning to yourself.

## 2020-06-26 NOTE — ED Notes (Signed)
Pt states breathing feels even and unlabored with ambulation to rest room. Pt states he lives at [facility] and can easily get around

## 2020-06-26 NOTE — ED Notes (Signed)
Pt has been off diuretic medication since Thursday r/t kidney function but continues to retain fluid and had a 10lb weight gain. Pt also sees the wound center for poor circulation of bilateral lower ext. Daughter at bedside

## 2020-06-27 ENCOUNTER — Telehealth: Payer: Self-pay | Admitting: Cardiovascular Disease

## 2020-06-27 DIAGNOSIS — Z7901 Long term (current) use of anticoagulants: Secondary | ICD-10-CM

## 2020-06-27 DIAGNOSIS — Z79899 Other long term (current) drug therapy: Secondary | ICD-10-CM

## 2020-06-27 DIAGNOSIS — I5032 Chronic diastolic (congestive) heart failure: Secondary | ICD-10-CM

## 2020-06-27 DIAGNOSIS — N289 Disorder of kidney and ureter, unspecified: Secondary | ICD-10-CM

## 2020-06-27 NOTE — Telephone Encounter (Signed)
The patient was seen by Westley Foots, PA on 06/23/20. His creatinine on 06/19/20 was 1.60  Dr. Mariah Milling had made the below recommendations on 7/26: BMP Looks dehydrated perhaps with elevated BUN and CR Can we find out if he has been taking metolazone (would stop for now If taking  Hold diuretics 2 days  The  Patient was contact by nursing on 7/29 with these recommendations prior to his 06/23/20 appointment.  BMP repeated 7/30 showed creatinine 1.69.  Adela Lank was under the understanding the patient had been holding torsemide/ metolzaone since 7/26. Once this was clarified that the patient was notified of these results on 7/29, she gave the patient the weekend to remain off of his diuretics with a repeat BMP on 8/2.  Creatinine on 8/2 was 1.27. His weight was up from 221 lbs on 7/30 to 226 lbs on 8/2.  INR on 8/3 was > 8.0 in our office.  The patient was directed to the [facility] for a BMP/ CBC/ INR.  Based on results and weight gain/ symptoms, the patient was advised to go to the ER per [name], PA.   In the ER the patient was seen and released after 1 dose of Vitamin K. He was instructed to hold warfarin x 3 days and resume torsemide today.   I was messaged by the [facility] in [place] today stating they had spoken with the patient's daughter about repeat labs tomorrow and the daughter was confused about torsemide dosing. I advised I would review with Eula Listen, PA and call the patient's daughter back.

## 2020-06-27 NOTE — Progress Notes (Signed)
ABIMELEC, GROCHOWSKI ([id number]) Visit Report for 06/22/2020 Allergy List Details Patient Name: [name], [name]. Date of Service: 06/22/2020 2:30 PM Medical Record Number: [id number] Patient Account Number: [id number] Date of Birth/Sex: December 01, 1930 (86 y.o. M) Treating RN: Curtis Sites Primary Care Tobie Hellen: [name] Other Clinician: Referring Howell Groesbeck: [name] Treating Lavere Stork/Extender: STONE III, HOYT Weeks in Treatment: 0 Allergies Active Allergies Levaquin Sulfa (Sulfonamide Antibiotics) Allergy Notes Electronic Signature(s) Signed: 06/26/2020 6:49:35 PM By: Curtis Sites Entered By: Curtis Sites on 06/22/2020 14:53:31 [name], [name]. ([id number]) -------------------------------------------------------------------------------- Arrival Information Details Patient Name: [name]. Date of Service: 06/22/2020 2:30 PM Medical Record Number: [id number] Patient Account Number: [id number] Date of Birth/Sex: Jun 13, 1931 (86 y.o. M) Treating RN: Curtis Sites Primary Care Dillie Burandt: [name] Other Clinician: Referring Dejon Lukas: [name] Treating Leander Tout/Extender: Linwood Dibbles, HOYT Weeks in Treatment: 0 Visit Information Patient Arrived: Walker Arrival Time: 14:48 Accompanied By: daughter Transfer Assistance: None Patient Identification Verified: Yes Secondary Verification Process Completed: Yes Patient Has Alerts: Yes Patient Alerts: Patient on Blood Thinner Warfarin History Since Last Visit Added or deleted any medications: No Any new allergies or adverse reactions: No Had [name] fall or experienced change in activities of daily living that may affect risk of falls: No Signs or symptoms of abuse/neglect since last visito No Hospitalized since last visit: No Implantable device outside of the clinic excluding cellular tissue based products placed in the center since last visit: No Electronic Signature(s) Signed: 06/26/2020 6:49:35 PM By: Curtis Sites Entered By: Curtis Sites on 06/22/2020 14:48:45 [name], [name]. ([id number]) -------------------------------------------------------------------------------- Clinic Level of Care Assessment Details Patient Name: [name]. Date of Service: 06/22/2020 2:30 PM Medical Record Number: [id number] Patient Account Number: [id number] Date of Birth/Sex: 1931-05-31 (86 y.o. M) Treating RN: Huel Coventry Primary Care Areeb Corron: [name] Other Clinician: Referring Zayed Griffie: [name] Treating Chari Parmenter/Extender: Linwood Dibbles, HOYT Weeks in Treatment: 0 Clinic Level of Care Assessment Items TOOL 1 Quantity Score []  - Use when EandM and Procedure is performed on INITIAL visit 0 ASSESSMENTS - Nursing Assessment / Reassessment X - General Physical Exam (combine w/ comprehensive assessment (listed just below) when performed on new 1 20 pt. evals) X- 1 25 Comprehensive Assessment (HX, ROS, Risk Assessments, Wounds Hx, etc.) ASSESSMENTS - Wound and Skin Assessment / Reassessment []  - Dermatologic / Skin Assessment (not related to wound area) 0 ASSESSMENTS - Ostomy and/or Continence Assessment and Care []  - Incontinence Assessment and Management 0 []  - 0 Ostomy Care Assessment and Management (repouching, etc.) PROCESS - Coordination of Care X - Simple Patient / Family Education for ongoing care 1 15 []  - 0 Complex (extensive) Patient / Family Education for ongoing care []  - 0 Staff obtains , Records, Test Results / Process Orders []  - 0 Staff telephones HHA, Nursing Homes / Clarify orders / etc []  - 0 Routine Transfer to another Facility (non-emergent condition) []  - 0 Routine Hospital Admission (non-emergent condition) X- 1 15 New Admissions / / Ordering NPWT, Apligraf, etc. []  - 0 Emergency Hospital Admission (emergent condition) PROCESS - Special Needs []  - Pediatric / Minor Patient Management 0 []  - 0 Isolation Patient Management []   - 0 Hearing / Language / Visual special needs []  - 0 Assessment of Community assistance (transportation, D/C planning, etc.) []  - 0 Additional assistance / Altered mentation []  - 0 Support Surface(s) Assessment (bed, cushion, seat, etc.) INTERVENTIONS - Miscellaneous []  - External ear exam 0 []  - 0 Patient Transfer (multiple staff / /  Similar devices) []  - 0 Simple Staple / Suture removal (25 or less) []  - 0 Complex Staple / Suture removal (26 or more) []  - 0 Hypo/Hyperglycemic Management (do not check if billed separately) X- 1 15 Ankle / Brachial Index (ABI) - do not check if billed separately Has the patient been seen at the hospital within the last three years: Yes Total Score: 90 Level Of Care: New/Established - Level 3 [name], [name]. ( ) Electronic Signature(s) Signed: 06/22/2020 6:35:50 PM By: , BSN, RN, CWS, [name] RN, BSN Entered By: [id number], BSN, RN, CWS, [name] on 06/22/2020 18:33:59 [name]. ([name] Gurney) -------------------------------------------------------------------------------- Compression Therapy Details Patient Name: 06/24/2020 [name]. Date of Service: 06/22/2020 2:30 PM Medical Record Number: [id number] Patient Account Number: [name] Date of Birth/Sex: 1931/02/20 (86 y.o. M) Treating RN: [id number] Primary Care Ryot Burrous: 09/14/1931 Other Clinician: Referring Gladys Gutman: (91 Treating Damione Robideau/Extender: Huel Coventry, HOYT Weeks in Treatment: 0 Compression Therapy Performed for Wound Assessment: Wound #3 Left,Medial Lower Leg Performed By: Clinician [name], RN Compression Type: Four Layer Pre Treatment ABI: 1.2 Post Procedure Diagnosis Same as Pre-procedure Electronic Signature(s) Signed: 06/22/2020 6:33:43 PM By: Linwood Dibbles, BSN, RN, CWS, [name] RN, BSN Entered By: Huel Coventry, BSN, RN, CWS, [name] on 06/22/2020 18:33:42 [name].  ([name] Gurney) -------------------------------------------------------------------------------- Compression Therapy Details Patient Name: 06/24/2020 [name]. Date of Service: 06/22/2020 2:30 PM Medical Record Number: [id number] Patient Account Number: [name] Date of Birth/Sex: [birth year] (86 y.o. M) Treating RN: [id number] Primary Care Mirabel Ahlgren: 09/14/1931 Other Clinician: Referring Keyana Guevara: (91 Treating Quincy Prisco/Extender: Huel Coventry, HOYT Weeks in Treatment: 0 Compression Therapy Performed for Wound Assessment: Wound #4 Right,Anterior Lower Leg Performed By: Clinician [name], RN Compression Type: Four Layer Pre Treatment ABI: 1.2 Post Procedure Diagnosis Same as Pre-procedure Electronic Signature(s) Signed: 06/22/2020 6:33:43 PM By: Linwood Dibbles, BSN, RN, CWS, [name] RN, BSN Entered By: Huel Coventry, BSN, RN, CWS, [name] on 06/22/2020 18:33:43 [name] Gurney ([name] Gurney) -------------------------------------------------------------------------------- Encounter Discharge Information Details Patient Name: 06/24/2020 [name]. Date of Service: 06/22/2020 2:30 PM Medical Record Number: [id number] Patient Account Number: [name] Date of Birth/Sex: [birth year] (86 y.o. M) Treating RN: [id number] Primary Care Kip Cropp: 09/14/1931 Other Clinician: Referring Wilferd Ritson: (91 Treating Alba Kriesel/Extender: Huel Coventry, HOYT Weeks in Treatment: 0 Encounter Discharge Information Items Discharge Condition: Stable Ambulatory Status: Ambulatory Discharge Destination: Home Transportation: Private Auto Accompanied By: daughter Schedule Follow-up Appointment: Yes Clinical Summary of Care: Electronic Signature(s) Signed: 06/22/2020 6:35:21 PM By: [name], BSN, RN, CWS, [name] RN, BSN Entered By: Linwood Dibbles, BSN, RN, CWS, [name] on 06/22/2020 18:35:21 [name] Gurney ([name] Gurney) -------------------------------------------------------------------------------- Lower Extremity Assessment  Details Patient Name: AARIB, PULIDO [name]. Date of Service: 06/22/2020 2:30 PM Medical Record Number: [id number] Patient Account Number: [name] Date of Birth/Sex: December 12, 1930 (86 y.o. M) Treating RN: [id number] Primary Care Mayli Covington: 09/14/1931 Other Clinician: Referring Elan Mcelvain: (91 Treating Desirre Eickhoff/Extender: Curtis Sites, HOYT Weeks in Treatment: 0 Edema Assessment Assessed: [Left: No] [Right: No] Edema: [Left: Yes] [Right: Yes] Calf Left: Right: Point of Measurement: 36 cm From Medial Instep 39 cm 41 cm Ankle Left: Right: Point of Measurement: 12 cm From Medial Instep 23 cm 24 cm Vascular Assessment Pulses: Dorsalis Pedis Palpable: [Left:Yes] [Right:Yes] Doppler Audible: [Left:Yes] [Right:Yes] Posterior Tibial Palpable: [Left:Yes] [Right:Yes] Doppler Audible: [Left:Yes] [Right:Yes] Blood Pressure: Brachial: [Right:120] Dorsalis Pedis: 132 [Left:Dorsalis Pedis: 140] Ankle: Posterior Tibial: 146 [Left:Posterior Tibial: 144 1.22] [Right:1.20] Electronic Signature(s) Signed: 06/26/2020 6:49:35 PM By: [name] Entered By: Linwood Dibbles on 06/22/2020 15:29:01 [name], [name]. (  [id number]) -------------------------------------------------------------------------------- Multi Wound Chart Details Patient Name: ADREAN, HEITZ [name]. Date of Service: 06/22/2020 2:30 PM Medical Record Number: [id number] Patient Account Number: [id number] Date of Birth/Sex: 1930/12/12 (86 y.o. M) Treating RN: Huel Coventry Primary Care Izik Bingman: [name] Other Clinician: Referring Eathen Budreau: [name] Treating Nayely Dingus/Extender: Linwood Dibbles, HOYT Weeks in Treatment: 0 Vital Signs Height(in): 71 Pulse(bpm): 66 Weight(lbs): 220 Blood Pressure(mmHg): 123/60 Body Mass Index(BMI): 31 Temperature(F): 98.2 Respiratory Rate(breaths/min): 16 Photos: [N/[name]:N/[name]] Wound Location: Left, Medial Lower Leg Right, Anterior Lower Leg N/[name] Wounding Event: Gradually Appeared Gradually  Appeared N/[name] Primary Etiology: Venous Leg Ulcer Venous Leg Ulcer N/[name] Secondary Etiology: Lymphedema Lymphedema N/[name] Comorbid History: Cataracts, Sleep Apnea, Congestive Cataracts, Sleep Apnea, Congestive N/[name] Heart Failure, Coronary Artery Heart Failure, Coronary Artery Disease, Hypertension, Disease, Hypertension, Osteoarthritis Osteoarthritis Date Acquired: 02/24/2020 06/05/2020 N/[name] Weeks of Treatment: 0 0 N/[name] Wound Status: Open Open N/[name] Measurements L x W x D (cm) 2.1x1.3x0.2 0.2x0.2x0.1 N/[name] Area (cm) : 2.144 0.031 N/[name] Volume (cm) : 0.429 0.003 N/[name] Classification: Full Thickness Without Exposed Full Thickness Without Exposed N/[name] Support Structures Support Structures Exudate Amount: Medium Large N/[name] Exudate Type: Serosanguineous Serous N/[name] Exudate Color: red, brown amber N/[name] Wound Margin: Flat and Intact Flat and Intact N/[name] Granulation Amount: Small (1-33%) Medium (34-66%) N/[name] Granulation Quality: Red Red N/[name] Necrotic Amount: Large (67-100%) Medium (34-66%) N/[name] Exposed Structures: Fat Layer (Subcutaneous Tissue) Fat Layer (Subcutaneous Tissue) N/[name] Exposed: Yes Exposed: Yes Fascia: No Fascia: No Tendon: No Tendon: No Muscle: No Muscle: No Joint: No Joint: No Bone: No Bone: No Epithelialization: None None N/[name] Treatment Notes Electronic Signature(s) Signed: 06/22/2020 6:25:55 PM By: [name] Gurney, BSN, RN, CWS, [name] RN, BSN Entered By: [name] Gurney, BSN, RN, CWS, [name] on 06/22/2020 15:49:06 [name]. ([id number]) -------------------------------------------------------------------------------- Multi-Disciplinary Care Plan Details Patient Name: [name]. Date of Service: 06/22/2020 2:30 PM Medical Record Number: [id number] Patient Account Number: [id number] Date of Birth/Sex: 08/02/1931 (86 y.o. M) Treating RN: Huel Coventry Primary Care Kalden Wanke: [name] Other Clinician: Referring Shaylan Tutton: [name] Treating Jameal Razzano/Extender: Linwood Dibbles, HOYT Weeks in Treatment:  0 Active Inactive Necrotic Tissue Nursing Diagnoses: Impaired tissue integrity related to necrotic/devitalized tissue Goals: Necrotic/devitalized tissue will be minimized in the wound bed Date Initiated: 06/22/2020 Target Resolution Date: 06/28/2020 Goal Status: Active Interventions: Assess patient pain level pre-, during and post procedure and prior to discharge Treatment Activities: Apply topical anesthetic as ordered : 06/22/2020 Notes: Orientation to the Wound Care Program Nursing Diagnoses: Knowledge deficit related to the wound healing center program Goals: Patient/caregiver will verbalize understanding of the Wound Healing Center Program Date Initiated: 06/22/2020 Target Resolution Date: 06/29/2020 Goal Status: Active Interventions: Provide education on orientation to the wound center Notes: Venous Leg Ulcer Nursing Diagnoses: Actual venous Insuffiency (use after diagnosis is confirmed) Goals: Patient will maintain optimal edema control Date Initiated: 06/22/2020 Target Resolution Date: 06/29/2020 Goal Status: Active Interventions: Assess peripheral edema status every visit. Treatment Activities: Therapeutic compression applied : 06/22/2020 Notes: Wound/Skin Impairment Nursing Diagnoses: Impaired tissue integrity [name], [name]. ([id number]) Goals: Ulcer/skin breakdown will have [name] volume reduction of 30% by week 4 Date Initiated: 06/22/2020 Target Resolution Date: 07/23/2020 Goal Status: Active Interventions: Assess ulceration(s) every visit Treatment Activities: Referred to DME Lupe Handley for dressing supplies : 06/22/2020 Notes: Electronic Signature(s) Signed: 06/22/2020 6:25:55 PM By: [name] Gurney, BSN, RN, CWS, [name] RN, BSN Entered By: [name] Gurney, BSN, RN, CWS, [name] on 06/22/2020 15:48:45 [name], Tyquan [name]. ([id number]) -------------------------------------------------------------------------------- Non-Wound Condition Assessment Details Patient Name: [name]. Date of  Service:  06/22/2020 2:30 PM Medical Record Number: [id number] Patient Account Number: [id number] Date of Birth/Sex: Jul 06, 1931 (86 y.o. M) Treating RN: Curtis Sitesorthy, Joanna Primary Care Rebeca Valdivia: Marcelino DusterJOHNSTON, JOHN Other Clinician: Referring Joelle Flessner: Marcelino DusterJOHNSTON, JOHN Treating Dalis Beers/Extender: Linwood DibblesSTONE III, HOYT Weeks in Treatment: 0 Non-Wound Condition: Condition: Lymphedema Location: Leg Side: Bilateral Photos Electronic Signature(s) Signed: 06/26/2020 6:49:35 PM By: Curtis Sitesorthy, Joanna Entered By: Curtis Sitesorthy, Joanna on 06/22/2020 15:15:56 [name], [name]. ([id number]) -------------------------------------------------------------------------------- Pain Assessment Details Patient Name: [name], [name]. Date of Service: 06/22/2020 2:30 PM Medical Record Number: [id number] Patient Account Number: [id number] Date of Birth/Sex: Jul 06, 1931 [age] y.o. M) Treating RN: Curtis Sitesorthy, Joanna Primary Care Mariska Daffin: Marcelino DusterJOHNSTON, JOHN Other Clinician: Referring Keneth Borg: Marcelino DusterJOHNSTON, JOHN Treating Delila Kuklinski/Extender: Linwood DibblesSTONE III, HOYT Weeks in Treatment: 0 Active Problems Location of Pain Severity and Description of Pain Patient Has Paino Yes Site Locations Pain Location: Generalized Pain, Pain in Ulcers With Dressing Change: Yes Pain Management and Medication Current Pain Management: Electronic Signature(s) Signed: 06/26/2020 6:49:35 PM By: Curtis Sitesorthy, Joanna Entered By: Curtis Sitesorthy, Joanna on 06/22/2020 14:49:06 [name], Divine [name]. ([id number]) -------------------------------------------------------------------------------- Patient/Caregiver Education Details Patient Name: [name], Ho [name]. Date of Service: 06/22/2020 2:30 PM Medical Record Number: [id number] Patient Account Number: [id number] Date of Birth/Gender: Jul 06, 1931 [age] y.o. M) Treating RN: Huel CoventryWoody, [name] Primary Care Physician: Marcelino DusterJOHNSTON, JOHN Other Clinician: Referring Physician: Marcelino DusterJOHNSTON, JOHN Treating Physician/Extender: Skeet SimmerSTONE III, HOYT Weeks in Treatment: 0 Education  Assessment Education Provided To: Patient Education Topics Provided Venous: Handouts: Controlling Swelling with Multilayered Compression Wraps Methods: Demonstration, Explain/Verbal Responses: State content correctly Welcome To The Wound Care Center: Handouts: Welcome To The Wound Care Center Methods: Demonstration, Explain/Verbal Responses: State content correctly Wound/Skin Impairment: Handouts: Caring for Your Ulcer Methods: Demonstration, Explain/Verbal Responses: State content correctly Electronic Signature(s) Signed: 06/26/2020 5:21:36 PM By: [name] GurneyWoody, BSN, RN, CWS, [name] RN, BSN Previous Signature: 06/22/2020 6:35:50 PM Version By: [name] GurneyWoody, BSN, RN, CWS, [name] RN, BSN Entered By: [name] GurneyWoody, BSN, RN, CWS, [name] on 06/23/2020 11:01:25 [name], Maxtyn [name]. ([id number]) -------------------------------------------------------------------------------- Wound Assessment Details Patient Name: [name], Yasin [name]. Date of Service: 06/22/2020 2:30 PM Medical Record Number: [id number] Patient Account Number: [id number] Date of Birth/Sex: Jul 06, 1931 [age] y.o. M) Treating RN: Curtis Sitesorthy, Joanna Primary Care Curtez Brallier: Marcelino DusterJOHNSTON, JOHN Other Clinician: Referring Gracelin Weisberg: Marcelino DusterJOHNSTON, JOHN Treating Priscillia Fouch/Extender: Linwood DibblesSTONE III, HOYT Weeks in Treatment: 0 Wound Status Wound Number: 3 Primary Venous Leg Ulcer Etiology: Wound Location: Left, Medial Lower Leg Secondary Lymphedema Wounding Event: Gradually Appeared Etiology: Date Acquired: 02/24/2020 Wound Status: Open Weeks Of Treatment: 0 Comorbid Cataracts, Sleep Apnea, Congestive Heart Failure, Clustered Wound: No History: Coronary Artery Disease, Hypertension, Osteoarthritis Photos Wound Measurements Length: (cm) 2.1 Width: (cm) 1.3 Depth: (cm) 0.2 Area: (cm) 2.144 Volume: (cm) 0.429 % Reduction in Area: % Reduction in Volume: Epithelialization: None Tunneling: No Undermining: No Wound Description Classification: Full Thickness Without Exposed Support  Structu Wound Margin: Flat and Intact Exudate Amount: Medium Exudate Type: Serosanguineous Exudate Color: red, brown res Foul Odor After Cleansing: No Slough/Fibrino Yes Wound Bed Granulation Amount: Small (1-33%) Exposed Structure Granulation Quality: Red Fascia Exposed: No Necrotic Amount: Large (67-100%) Fat Layer (Subcutaneous Tissue) Exposed: Yes Necrotic Quality: Adherent Slough Tendon Exposed: No Muscle Exposed: No Joint Exposed: No Bone Exposed: No Treatment Notes Wound #3 (Left, Medial Lower Leg) Notes 4LB Electronic Signature(s) [name], [name]. ([id number]) Signed: 06/26/2020 6:49:35 PM By: Curtis Sitesorthy, Joanna Entered By: Curtis Sitesorthy, Joanna on 06/22/2020 15:15:14 [name], [name]. ([id number]) -------------------------------------------------------------------------------- Wound Assessment Details Patient Name: [name], Daune [name]. Date of Service: 06/22/2020 2:30 PM Medical Record Number: [id number] Patient Account Number: [id number] Date  of Birth/Sex: Jun 24, 1931 (86 y.o. M) Treating RN: Curtis Sites Primary Care Bailey Faiella: [name] Other Clinician: Referring Liv Rallis: [name] Treating Raynell Scott/Extender: Linwood Dibbles, HOYT Weeks in Treatment: 0 Wound Status Wound Number: 4 Primary Venous Leg Ulcer Etiology: Wound Location: Right, Anterior Lower Leg Secondary Lymphedema Wounding Event: Gradually Appeared Etiology: Date Acquired: 06/05/2020 Wound Status: Open Weeks Of Treatment: 0 Comorbid Cataracts, Sleep Apnea, Congestive Heart Failure, Clustered Wound: No History: Coronary Artery Disease, Hypertension, Osteoarthritis Photos Wound Measurements Length: (cm) 0.2 Width: (cm) 0.2 Depth: (cm) 0.1 Area: (cm) 0.031 Volume: (cm) 0.003 % Reduction in Area: % Reduction in Volume: Epithelialization: None Tunneling: No Undermining: No Wound Description Classification: Full Thickness Without Exposed Support Structu Wound Margin: Flat and Intact Exudate  Amount: Large Exudate Type: Serous Exudate Color: amber res Foul Odor After Cleansing: No Slough/Fibrino Yes Wound Bed Granulation Amount: Medium (34-66%) Exposed Structure Granulation Quality: Red Fascia Exposed: No Necrotic Amount: Medium (34-66%) Fat Layer (Subcutaneous Tissue) Exposed: Yes Necrotic Quality: Adherent Slough Tendon Exposed: No Muscle Exposed: No Joint Exposed: No Bone Exposed: No Treatment Notes Wound #4 (Right, Anterior Lower Leg) Notes 4LB Electronic Signature(s) JONATHAN, CORPUS ([id number]) Signed: 06/26/2020 6:49:35 PM By: Curtis Sites Entered By: Curtis Sites on 06/22/2020 15:23:41 [name]. ([id number]) -------------------------------------------------------------------------------- Vitals Details Patient Name: [name]. Date of Service: 06/22/2020 2:30 PM Medical Record Number: [id number] Patient Account Number: [id number] Date of Birth/Sex: 12/30/1930 (86 y.o. M) Treating RN: Curtis Sites Primary Care Atiba Kimberlin: [name] Other Clinician: Referring Shateka Petrea: [name] Treating Kemp Gomes/Extender: Linwood Dibbles, HOYT Weeks in Treatment: 0 Vital Signs Time Taken: 14:49 Temperature (F): 98.2 Height (in): 71 Pulse (bpm): 66 Source: Measured Respiratory Rate (breaths/min): 16 Weight (lbs): 220 Blood Pressure (mmHg): 123/60 Source: Measured Reference Range: 80 - 120 mg / dl Body Mass Index (BMI): 30.7 Electronic Signature(s) Signed: 06/26/2020 6:49:35 PM By: Curtis Sites Entered By: Curtis Sites on 06/22/2020 14:53:22

## 2020-06-27 NOTE — Telephone Encounter (Signed)
Will fwd to [facility] to address f/u on the patients INR and warfarin management

## 2020-06-27 NOTE — Progress Notes (Signed)
SMITH, POTENZA ([id number]) Visit Report for 06/22/2020 Abuse/Suicide Risk Screen Details Patient Name: [name], [name] Date of Service: 06/22/2020 2:30 PM Medical Record Number: [id number] Patient Account Number: [id number] Date of Birth/Sex: 1930-12-12 (86 y.o. M) Treating RN: Curtis Sites Primary Care Kaidon Kinker: [name] Other Clinician: Referring Trinitey Roache: [name] Treating Ambrosio Reuter/Extender: Linwood Dibbles, HOYT Weeks in Treatment: 0 Abuse/Suicide Risk Screen Items Answer ABUSE RISK SCREEN: Has anyone close to you tried to hurt or harm you recentlyo No Do you feel uncomfortable with anyone in your familyo No Has anyone forced you do things that you didnot want to doo No Electronic Signature(s) Signed: 06/26/2020 6:49:35 PM By: Curtis Sites Entered By: Curtis Sites on 06/22/2020 14:54:57 [name], [name] ([id number]) -------------------------------------------------------------------------------- Activities of Daily Living Details Patient Name: [name] Hilts A. Date of Service: 06/22/2020 2:30 PM Medical Record Number: [id number] Patient Account Number: [id number] Date of Birth/Sex: 08-31-1931 (86 y.o. M) Treating RN: Curtis Sites Primary Care Sylar Voong: [name] Other Clinician: Referring Paublo Warshawsky: [name] Treating Jerzy Crotteau/Extender: Linwood Dibbles, HOYT Weeks in Treatment: 0 Activities of Daily Living Items Answer Activities of Daily Living (Please select one for each item) Drive Automobile Not Able Take Medications Completely Able Use Telephone Completely Able Care for Appearance Completely Able Use Toilet Completely Able Bath / Shower Completely Able Dress Self Completely Able Feed Self Completely Able Walk Completely Able Get In / Out Bed Completely Able Housework Need Assistance Prepare Meals Need Assistance Handle Money Completely Able Shop for Self Need Assistance Electronic Signature(s) Signed: 06/26/2020 6:49:35 PM By: Curtis Sites Entered By: Curtis Sites on 06/22/2020 14:55:43 [name] Hilts A. ([id number]) -------------------------------------------------------------------------------- Education Screening Details Patient Name: [name] Hilts A. Date of Service: 06/22/2020 2:30 PM Medical Record Number: [id number] Patient Account Number: [id number] Date of Birth/Sex: 1931-05-23 (86 y.o. M) Treating RN: Curtis Sites Primary Care Zeynab Klett: [name] Other Clinician: Referring Lesle Faron: [name] Treating Thersia Petraglia/Extender: Linwood Dibbles, HOYT Weeks in Treatment: 0 Primary Learner Assessed: Patient Learning Preferences/Education Level/Primary Language Learning Preference: Explanation, Demonstration Highest Education Level: College or Above Preferred Language: English Cognitive Barrier Language Barrier: No Translator Needed: No Memory Deficit: No Emotional Barrier: No Cultural/Religious Beliefs Affecting Medical Care: No Physical Barrier Impaired Vision: No Impaired Hearing: No Decreased Hand dexterity: No Knowledge/Comprehension Knowledge Level: Medium Comprehension Level: Medium Ability to understand written instructions: Medium Ability to understand verbal instructions: Medium Motivation Anxiety Level: Calm Cooperation: Cooperative Education Importance: Acknowledges Need Interest in Health Problems: Asks Questions Perception: Coherent Willingness to Engage in Self-Management Medium Activities: Readiness to Engage in Self-Management Medium Activities: Electronic Signature(s) Signed: 06/26/2020 6:49:35 PM By: Curtis Sites Entered By: Curtis Sites on 06/22/2020 14:56:14 [name] Hilts A. ([id number]) -------------------------------------------------------------------------------- Fall Risk Assessment Details Patient Name: [name] Hilts A. Date of Service: 06/22/2020 2:30 PM Medical Record Number: [id number] Patient Account Number: [id number] Date of Birth/Sex: Dec 31, 1930 (86  y.o. M) Treating RN: Curtis Sites Primary Care Mia Milan: [name] Other Clinician: Referring Jadee Golebiewski: [name] Treating Alandis Bluemel/Extender: Linwood Dibbles, HOYT Weeks in Treatment: 0 Fall Risk Assessment Items Have you had 2 or more falls in the last 12 monthso 0 No Have you had any fall that resulted in injury in the last 12 monthso 0 No FALLS RISK SCREEN History of falling - immediate or within 3 months 0 No Secondary diagnosis (Do you have 2 or more medical diagnoseso) 0 No Ambulatory aid None/bed rest/wheelchair/nurse 0 No Crutches/cane/walker 15 Yes Furniture 0 No Intravenous therapy Access/Saline/Heparin Lock 0 No Gait/Transferring Normal/ bed rest/ wheelchair 0 No Weak (short  steps with or without shuffle, stooped but able to lift head while walking, may 10 Yes seek support from furniture) Impaired (short steps with shuffle, may have difficulty arising from chair, head down, impaired 0 No balance) Mental Status Oriented to own ability 0 Yes Electronic Signature(s) Signed: 06/26/2020 6:49:35 PM By: Curtis Sites Entered By: Curtis Sites on 06/22/2020 14:56:28 [name], [name] ([id number]) -------------------------------------------------------------------------------- Foot Assessment Details Patient Name: [name] Hilts A. Date of Service: 06/22/2020 2:30 PM Medical Record Number: [id number] Patient Account Number: [id number] Date of Birth/Sex: 01/08/1931 (86 y.o. M) Treating RN: Curtis Sites Primary Care Skyra Crichlow: [name] Other Clinician: Referring Eyleen Rawlinson: [name] Treating Roi Jafari/Extender: Linwood Dibbles, HOYT Weeks in Treatment: 0 Foot Assessment Items Site Locations + = Sensation present, - = Sensation absent, C = Callus, U = Ulcer R = Redness, W = Warmth, M = Maceration, PU = Pre-ulcerative lesion F = Fissure, S = Swelling, D = Dryness Assessment Right: Left: Other Deformity: No No Prior Foot Ulcer: No No Prior Amputation: No  No Charcot Joint: No No Ambulatory Status: Ambulatory With Help Assistance Device: Walker Gait: Steady Electronic Signature(s) Signed: 06/26/2020 6:49:35 PM By: Curtis Sites Entered By: Curtis Sites on 06/22/2020 14:57:44 [name], [name] ([id number]) -------------------------------------------------------------------------------- Nutrition Risk Screening Details Patient Name: [name] Hilts A. Date of Service: 06/22/2020 2:30 PM Medical Record Number: [id number] Patient Account Number: [id number] Date of Birth/Sex: 11/15/1931 (86 y.o. M) Treating RN: Curtis Sites Primary Care Sohail Capraro: [name] Other Clinician: Referring Raahi Korber: [name] Treating Jermell Holeman/Extender: Linwood Dibbles, HOYT Weeks in Treatment: 0 Height (in): 71 Weight (lbs): 220 Body Mass Index (BMI): 30.7 Nutrition Risk Screening Items Score Screening NUTRITION RISK SCREEN: I have an illness or condition that made me change the kind and/or amount of food I eat 0 No I eat fewer than two meals per day 0 No I eat few fruits and vegetables, or milk products 0 No I have three or more drinks of beer, liquor or wine almost every day 0 No I have tooth or mouth problems that make it hard for me to eat 0 No I don't always have enough money to buy the food I need 0 No I eat alone most of the time 0 No I take three or more different prescribed or over-the-counter drugs a day 1 Yes Without wanting to, I have lost or gained 10 pounds in the last six months 0 No I am not always physically able to shop, cook and/or feed myself 0 No Nutrition Protocols Good Risk Protocol 0 No interventions needed Moderate Risk Protocol High Risk Proctocol Risk Level: Good Risk Score: 1 Electronic Signature(s) Signed: 06/26/2020 6:49:35 PM By: Curtis Sites Entered By: Curtis Sites on 06/22/2020 14:57:34

## 2020-06-27 NOTE — Telephone Encounter (Signed)
Disregard - see separate phone note today for further details.

## 2020-06-27 NOTE — Telephone Encounter (Signed)
I reviewed the patient's chart with Eula Listen, PA in [name]'s absence today.  Orders received to: - clarify with the patient is he was taking metolazone twice weekly prior to stopping torsemide, if he was  - resume torsemide 40 mg twice daily today - stay off metolazone  - if the patient had not been taking metolazone regularly, then resume torsemide at 20 mg BID  - repeat a CMET/ INR tomorrow.   I have called and spoken with the patient's daughter [name]. She is currently with the patient.  She has clarified with the patient that he was taking metolazone twice weekly until told to stop at the end of last week.  I have advised her: 1) the patient should stay off metolazone 2) resume torsemide 40 mg BID (he already took the 1st dose this morning 3) [facility] (they are aware they will not need the [facility] appt in the morning)  [name] is aware that the [facility] clinic will be back in touch with her tomorrow. She is aware if the patient's CMET is stable, he will continue torsemide 40 mg BID until his follow up appointment on Monday 8/921 with [name] Shields, NP. I inquired if the patient is weighing daily. Per [name], the patient has not been weighing daily, but did weight this morning and was 210 or 212 lbs per his report.   I have advised [name] to have the patient weigh every morning upon waking, but after voiding and write these weights down every day until his follow up appointment on 8/9. I have her to have the patient bring these with him.   [name] voices understanding of the above recommendations and is agreeable.  She was appreciative for the call back.

## 2020-06-27 NOTE — Progress Notes (Signed)
TELLER, WAKEFIELD ([id number]) Visit Report for 06/22/2020 Chief Complaint Document Details Patient Name: [name], [name] Date of Service: 06/22/2020 2:30 PM Medical Record Number: [id number] Patient Account Number: [id number] Date of Birth/Sex: 14-Mar-1931 (86 y.o. M) Treating RN: Huel Coventry Primary Care Provider: [name] Other Clinician: Referring Provider: [name] Treating Provider/Extender: Linwood Dibbles, Rosalie Gelpi Weeks in Treatment: 0 Information Obtained from: Patient Chief Complaint 04/21/2019; patient is here for review of wounds x2 on his right lower extremity. Electronic Signature(s) Signed: 06/22/2020 3:36:07 PM By: Lenda Kelp PA-C Entered By: Lenda Kelp on 06/22/2020 15:36:06 [name] A. ([id number]) -------------------------------------------------------------------------------- HPI Details Patient Name: [name] A. Date of Service: 06/22/2020 2:30 PM Medical Record Number: [id number] Patient Account Number: [id number] Date of Birth/Sex: 13-Jul-1931 (86 y.o. M) Treating RN: Huel Coventry Primary Care Provider: [name] Other Clinician: Referring Provider: [name] Treating Provider/Extender: Linwood Dibbles, Griffith Santilli Weeks in Treatment: 0 History of Present Illness HPI Description: 04/21/2019 ADMISSION This is an independent 86 year old man who lives in the independent part of [address] of [place]. He has a history of chronic lower extremity edema and wears compression stockings. He states about a month ago he was taking off the one on the right and pulled some skin off accidentally. He has had 2 small wounds on the right anterior and right medial lower extremity. I note looking through [facility] health link that he had multiple venous ultrasounds in 2015 and 16. These were DVT rule outs. He did have a Baker's cyst on the left. He has not not had a previous wound history although he did have a history of cellulitis in his legs. Past medical history;  includes aortic stenosis status post mechanical AVR in 2007 on chronic Coumadin, lower extremity edema with a history of cellulitis, history of MRSA. ABIs in our clinic were 1.1 on the right 6/3; patient with predominantly venous insufficiency ulcers in the right lower leg probably some degree of lymphedema. He had to wounds last week. We put him in 3 layer compression. The nurses at [facility] are changing the dressing. The area laterally is healed but he still has a small very painful area on the anterior tibial area. He is on Coumadin because of mechanical aortic valve. 6/10; venous insufficiency ulcers on the right lower leg. He has some degree of lymphedema. We have been using 3 layer compression with silver collagen small wound. Dimensions are improved. I gave him doxycycline last week which he tolerated because of surrounding erythema. This is improved also 6/17; arrives in clinic today with portable silver collagen dressing tightly adherent to the wound. Also felt that the wrap was too tight 3 layer being changed at the [facility] 6/24; patient's wound is small still adherent debris over the surface however even under illumination it was hard to see what was still open here. We have been using silver collagen 7/1; the patient arrives in the clinic today and the area on the right lower leg is healed. He has severe chronic venous insufficiency. He has 20/30 compression stockings. Readmission: 06/22/2020 on evaluation today patient presents for reevaluation here in our clinic although it has been a little over a year since we last saw him. He does have a history of lymphedema, venous insufficiency, anticoagulant therapy, he is on a pacemaker which is the reason for the anticoagulants, hypertension, and congestive heart failure. With that being said unfortunately he has been dealing with a wound of the left and right legs which has  been giving him some trouble since arrival  first 2021. That is in regard to the left leg ulcer. In regard to the right leg ulcer this is just a very small area that really I think will seal up quite nicely is more of the lymphedema opening than anything else. With that being said unfortunately the left leg is quite significant. We actually have noted that the patient's been dealing with this for quite some time and I think that there is a chance we may want to consider biopsy. With that being said he is on Coumadin therefore we were not able to perform a biopsy at this point. I think that something however in the future that may be warranted we may just have to take him off of the current Coumadin regimen in order to do this. We discussed doing it today but I am more concerned about the fact that he could have issues with uncontrolled bleeding and in that case we may have to send him to the ER. His daughter was not wanting to take that chance which I can completely understand. Nonetheless so far they have been using antibiotic ointment over the area I am just putting a protective dressing I do believe the patient is that he needs some type of compression. Electronic Signature(s) Signed: 06/23/2020 5:16:19 PM By: Lenda Kelp PA-C Entered By: Lenda Kelp on 06/23/2020 17:16:19 Joellyn Quails ([id number]) -------------------------------------------------------------------------------- Physical Exam Details Patient Name: [name] A. Date of Service: 06/22/2020 2:30 PM Medical Record Number: [id number] Patient Account Number: [id number] Date of Birth/Sex: [birth year] (86 y.o. M) Treating RN: Huel Coventry Primary Care Provider: [name] Other Clinician: Referring Provider: [name] Treating Provider/Extender: Linwood Dibbles, Iraida Cragin Weeks in Treatment: 0 Constitutional sitting or standing blood pressure is within target range for patient.. pulse regular and within target range for patient.Marland Kitchen respirations regular, non- labored  and within target range for patient.Marland Kitchen temperature within target range for patient.. Well-nourished and well-hydrated in no acute distress. Eyes conjunctiva clear no eyelid edema noted. pupils equal round and reactive to light and accommodation. Ears, Nose, Mouth, and Throat no gross abnormality of ear auricles or external auditory canals. normal hearing noted during conversation. mucus membranes moist. Respiratory normal breathing without difficulty. Cardiovascular 1+ pitting edema of the bilateral lower extremities. Musculoskeletal Patient unable to walk without assistance. no significant deformity or arthritic changes, no loss or range of motion, no clubbing. Psychiatric this patient is able to make decisions and demonstrates good insight into disease process. Alert and Oriented x 3. pleasant and cooperative. Notes Upon inspection patient's wound bed actually did seem to be somewhat friable and the margins a little irregular. With that being said unfortunately I do not think a biopsy is warranted at this time due to the fact that he is on Coumadin and we probably need to get him off of that in order to perform the biopsy here for getting go that route. His daughter and the patient are in agreement with that plan. Otherwise for the time being I think possibly Hydrofera Blue could be beneficial for him. Electronic Signature(s) Signed: 06/23/2020 5:16:52 PM By: Lenda Kelp PA-C Entered By: Lenda Kelp on 06/23/2020 17:16:51 Joellyn Quails ([id number]) -------------------------------------------------------------------------------- Physician Orders Details Patient Name: [name] A. Date of Service: 06/22/2020 2:30 PM Medical Record Number: [id number] Patient Account Number: [id number] Date of Birth/Sex: 1931/05/28 (86 y.o. M) Treating RN: Huel Coventry Primary Care Provider: [name] Other Clinician: Referring Provider: [name] Treating  Provider/Extender: Linwood Dibbles, Dayquan Buys Weeks in Treatment: 0 Verbal / Phone Orders: No Diagnosis Coding ICD-10 Coding Code Description I89.0 Lymphedema, not elsewhere classified I87.2 Venous insufficiency (chronic) (peripheral) L97.822 Non-pressure chronic ulcer of other part of left lower leg with fat layer exposed L97.812 Non-pressure chronic ulcer of other part of right lower leg with fat layer exposed Z79.01 Long term (current) use of anticoagulants Z95.0 Presence of cardiac pacemaker I10 Essential (primary) hypertension I50.42 Chronic combined systolic (congestive) and diastolic (congestive) heart failure I25.10 Atherosclerotic heart disease of native coronary artery without angina pectoris Wound Cleansing Wound #3 Left,Medial Lower Leg o Dial antibacterial soap, wash wounds, rinse and pat dry prior to dressing wounds Wound #4 Right,Anterior Lower Leg o Dial antibacterial soap, wash wounds, rinse and pat dry prior to dressing wounds Anesthetic (add to Medication List) Wound #3 Left,Medial Lower Leg o Topical Lidocaine 4% cream applied to wound bed prior to debridement (In Clinic Only). Wound #4 Right,Anterior Lower Leg o Topical Lidocaine 4% cream applied to wound bed prior to debridement (In Clinic Only). Primary Wound Dressing Wound #3 Left,Medial Lower Leg o Hydrafera Blue Ready Transfer Wound #4 Right,Anterior Lower Leg o Hydrafera Blue Ready Transfer Secondary Dressing Wound #3 Left,Medial Lower Leg o XtraSorb Wound #4 Right,Anterior Lower Leg o XtraSorb Dressing Change Frequency Wound #3 Left,Medial Lower Leg o Change dressing every week Wound #4 Right,Anterior Lower Leg o Change dressing every week Follow-up Appointments Wound #3 Left,Medial Lower Leg o Return Appointment in 2 weeks. o Nurse Visit as needed - 1 week Heinsohn, [place] A. ([id number]) Wound #4 Right,Anterior Lower Leg o Return Appointment in 2 weeks. o Nurse Visit as needed - 1 week Edema  Control Wound #3 Left,Medial Lower Leg o 4 Layer Compression System - Bilateral - unna to anchor o Elevate legs to the level of the heart and pump ankles as often as possible Wound #4 Right,Anterior Lower Leg o 4 Layer Compression System - Bilateral - unna to anchor o Elevate legs to the level of the heart and pump ankles as often as possible Electronic Signature(s) Signed: 06/22/2020 5:01:28 PM By: Lenda Kelp PA-C Signed: 06/22/2020 6:25:55 PM By: Elliot Gurney, BSN, RN, CWS, [name] RN, BSN Entered By: Elliot Gurney, BSN, RN, CWS, [name] on 06/22/2020 15:58:22 [name] A. ([id number]) -------------------------------------------------------------------------------- Problem List Details Patient Name: [name] A. Date of Service: 06/22/2020 2:30 PM Medical Record Number: [id number] Patient Account Number: [id number] Date of Birth/Sex: 12/09/1930 (86 y.o. M) Treating RN: Huel Coventry Primary Care Provider: [name] Other Clinician: Referring Provider: [name] Treating Provider/Extender: Linwood Dibbles, Jennett Tarbell Weeks in Treatment: 0 Active Problems ICD-10 Encounter Code Description Active Date MDM Diagnosis I89.0 Lymphedema, not elsewhere classified 06/22/2020 No Yes I87.2 Venous insufficiency (chronic) (peripheral) 06/22/2020 No Yes L97.822 Non-pressure chronic ulcer of other part of left lower leg with fat layer 06/22/2020 No Yes exposed L97.812 Non-pressure chronic ulcer of other part of right lower leg with fat layer 06/22/2020 No Yes exposed Z79.01 Long term (current) use of anticoagulants 06/22/2020 No Yes Z95.0 Presence of cardiac pacemaker 06/22/2020 No Yes I10 Essential (primary) hypertension 06/22/2020 No Yes I50.42 Chronic combined systolic (congestive) and diastolic (congestive) heart 06/22/2020 No Yes failure I25.10 Atherosclerotic heart disease of native coronary artery without angina 06/22/2020 No Yes pectoris Inactive Problems Resolved Problems Electronic  Signature(s) Signed: 06/22/2020 3:37:28 PM By: Lenda Kelp PA-C Previous Signature: 06/22/2020 3:35:55 PM Version By: Lenda Kelp PA-C Entered By: Lenda Kelp on 06/22/2020 15:37:27 Pollan, Madeline A. ([id number]) --------------------------------------------------------------------------------  Progress Note Details Patient Name: ARIES, TOWNLEY A. Date of Service: 06/22/2020 2:30 PM Medical Record Number: [id number] Patient Account Number: [id number] Date of Birth/Sex: March 02, 1931 (87 y.o. M) Treating RN: Huel Coventry Primary Care Provider: [name] Other Clinician: Referring Provider: [name] Treating Provider/Extender: Linwood Dibbles, Akul Leggette Weeks in Treatment: 0 Subjective Chief Complaint Information obtained from Patient 04/21/2019; patient is here for review of wounds x2 on his right lower extremity. History of Present Illness (HPI) 04/21/2019 ADMISSION This is an independent 86 year old man who lives in the independent part of [address] of [place]. He has a history of chronic lower extremity edema and wears compression stockings. He states about a month ago he was taking off the one on the right and pulled some skin off accidentally. He has had 2 small wounds on the right anterior and right medial lower extremity. I note looking through [facility] health link that he had multiple venous ultrasounds in 2015 and 16. These were DVT rule outs. He did have a Baker's cyst on the left. He has not not had a previous wound history although he did have a history of cellulitis in his legs. Past medical history; includes aortic stenosis status post mechanical AVR in 2007 on chronic Coumadin, lower extremity edema with a history of cellulitis, history of MRSA. ABIs in our clinic were 1.1 on the right 6/3; patient with predominantly venous insufficiency ulcers in the right lower leg probably some degree of lymphedema. He had to wounds last week. We put him in 3 layer compression. The nurses  at [facility] are changing the dressing. The area laterally is healed but he still has a small very painful area on the anterior tibial area. He is on Coumadin because of mechanical aortic valve. 6/10; venous insufficiency ulcers on the right lower leg. He has some degree of lymphedema. We have been using 3 layer compression with silver collagen small wound. Dimensions are improved. I gave him doxycycline last week which he tolerated because of surrounding erythema. This is improved also 6/17; arrives in clinic today with portable silver collagen dressing tightly adherent to the wound. Also felt that the wrap was too tight 3 layer being changed at the [facility] 6/24; patient's wound is small still adherent debris over the surface however even under illumination it was hard to see what was still open here. We have been using silver collagen 7/1; the patient arrives in the clinic today and the area on the right lower leg is healed. He has severe chronic venous insufficiency. He has 20/30 compression stockings. Readmission: 06/22/2020 on evaluation today patient presents for reevaluation here in our clinic although it has been a little over a year since we last saw him. He does have a history of lymphedema, venous insufficiency, anticoagulant therapy, he is on a pacemaker which is the reason for the anticoagulants, hypertension, and congestive heart failure. With that being said unfortunately he has been dealing with a wound of the left and right legs which has been giving him some trouble since arrival first 2021. That is in regard to the left leg ulcer. In regard to the right leg ulcer this is just a very small area that really I think will seal up quite nicely is more of the lymphedema opening than anything else. With that being said unfortunately the left leg is quite significant. We actually have noted that the patient's been dealing with this for quite some time and I  think that there is a chance  we may want to consider biopsy. With that being said he is on Coumadin therefore we were not able to perform a biopsy at this point. I think that something however in the future that may be warranted we may just have to take him off of the current Coumadin regimen in order to do this. We discussed doing it today but I am more concerned about the fact that he could have issues with uncontrolled bleeding and in that case we may have to send him to the ER. His daughter was not wanting to take that chance which I can completely understand. Nonetheless so far they have been using antibiotic ointment over the area I am just putting a protective dressing I do believe the patient is that he needs some type of compression. Patient History Information obtained from Patient. Allergies Levaquin, Sulfa (Sulfonamide Antibiotics) Family History Diabetes - Mother, Heart Disease - Mother,Father, Hypertension - Mother,Father, Stroke - Father,Siblings, No family history of Cancer, Hereditary Spherocytosis, Kidney Disease, Lung Disease, Seizures, Thyroid Problems, Tuberculosis. Social History Former smoker - 1975, Marital Status - Married, Alcohol Use - Moderate, Drug Use - No History, Caffeine Use - Daily. Medical History Eyes Patient has history of Cataracts Lesinski, Winfrey A. ([id number]) Denies history of Glaucoma, Optic Neuritis Respiratory Patient has history of Sleep Apnea Denies history of Aspiration, Asthma, Chronic Obstructive Pulmonary Disease (COPD), Pneumothorax, Tuberculosis Cardiovascular Patient has history of Congestive Heart Failure, Coronary Artery Disease, Hypertension Denies history of Angina, Arrhythmia, Deep Vein Thrombosis, Hypotension, Myocardial Infarction, Peripheral Arterial Disease, Peripheral Venous Disease, Phlebitis, Vasculitis Gastrointestinal Denies history of Cirrhosis , Colitis, Crohn s, Hepatitis A, Hepatitis B, Hepatitis C Integumentary  (Skin) Denies history of History of Burn, History of pressure wounds Musculoskeletal Patient has history of Osteoarthritis Denies history of Gout, Rheumatoid Arthritis, Osteomyelitis Medical And Surgical History Notes Cardiovascular bradycardia, aortic valve replaced 2002, carotid bruits Gastrointestinal hx GI bleed Review of Systems (ROS) Constitutional Symptoms (General Health) Denies complaints or symptoms of Fatigue, Fever, Chills, Marked Weight Change. Ear/Nose/Mouth/Throat Denies complaints or symptoms of Difficult clearing ears, Sinusitis. Hematologic/Lymphatic Denies complaints or symptoms of Bleeding / Clotting Disorders, Human Immunodeficiency Virus. Respiratory Denies complaints or symptoms of Chronic or frequent coughs, Shortness of Breath. Cardiovascular Complains or has symptoms of LE edema. Denies complaints or symptoms of Chest pain. Gastrointestinal Denies complaints or symptoms of Frequent diarrhea, Nausea, Vomiting. Endocrine Denies complaints or symptoms of Hepatitis, Thyroid disease, Polydypsia (Excessive Thirst). Genitourinary Denies complaints or symptoms of Kidney failure/ Dialysis, Incontinence/dribbling. Immunological Denies complaints or symptoms of Hives, Itching. Integumentary (Skin) Complains or has symptoms of Wounds, Swelling. Denies complaints or symptoms of Bleeding or bruising tendency, Breakdown. Musculoskeletal Denies complaints or symptoms of Muscle Pain, Muscle Weakness. Neurologic Denies complaints or symptoms of Numbness/parasthesias, Focal/Weakness. Psychiatric Denies complaints or symptoms of Anxiety, Claustrophobia. Objective Constitutional sitting or standing blood pressure is within target range for patient.. pulse regular and within target range for patient.Marland Kitchen respirations regular, non- labored and within target range for patient.Marland Kitchen temperature within target range for patient.. Well-nourished and well-hydrated in no acute  distress. Vitals Time Taken: 2:49 PM, Height: 71 in, Source: Measured, Weight: 220 lbs, Source: Measured, BMI: 30.7, Temperature: 98.2 F, Pulse: 66 bpm, Respiratory Rate: 16 breaths/min, Blood Pressure: 123/60 mmHg. Eyes conjunctiva clear no eyelid edema noted. pupils equal round and reactive to light and accommodation. Ears, Nose, Mouth, and Throat no gross abnormality of ear auricles or external auditory canals. normal hearing noted during conversation. mucus membranes moist. Respiratory normal breathing without  difficulty. Tarrant, Payson A. ([id number]) Cardiovascular 1+ pitting edema of the bilateral lower extremities. Musculoskeletal Patient unable to walk without assistance. no significant deformity or arthritic changes, no loss or range of motion, no clubbing. Psychiatric this patient is able to make decisions and demonstrates good insight into disease process. Alert and Oriented x 3. pleasant and cooperative. General Notes: Upon inspection patient's wound bed actually did seem to be somewhat friable and the margins a little irregular. With that being said unfortunately I do not think a biopsy is warranted at this time due to the fact that he is on Coumadin and we probably need to get him off of that in order to perform the biopsy here for getting go that route. His daughter and the patient are in agreement with that plan. Otherwise for the time being I think possibly Hydrofera Blue could be beneficial for him. Integumentary (Hair, Skin) Wound #3 status is Open. Original cause of wound was Gradually Appeared. The wound is located on the Left,Medial Lower Leg. The wound measures 2.1cm length x 1.3cm width x 0.2cm depth; 2.144cm^2 area and 0.429cm^3 volume. There is Fat Layer (Subcutaneous Tissue) Exposed exposed. There is no tunneling or undermining noted. There is a medium amount of serosanguineous drainage noted. The wound margin is flat and intact. There is small (1-33%) red  granulation within the wound bed. There is a large (67-100%) amount of necrotic tissue within the wound bed including Adherent Slough. Wound #4 status is Open. Original cause of wound was Gradually Appeared. The wound is located on the Right,Anterior Lower Leg. The wound measures 0.2cm length x 0.2cm width x 0.1cm depth; 0.031cm^2 area and 0.003cm^3 volume. There is Fat Layer (Subcutaneous Tissue) Exposed exposed. There is no tunneling or undermining noted. There is a large amount of serous drainage noted. The wound margin is flat and intact. There is medium (34-66%) red granulation within the wound bed. There is a medium (34-66%) amount of necrotic tissue within the wound bed including Adherent Slough. Other Condition(s) Patient presents with Lymphedema located on the Bilateral Leg. Assessment Active Problems ICD-10 Lymphedema, not elsewhere classified Venous insufficiency (chronic) (peripheral) Non-pressure chronic ulcer of other part of left lower leg with fat layer exposed Non-pressure chronic ulcer of other part of right lower leg with fat layer exposed Long term (current) use of anticoagulants Presence of cardiac pacemaker Essential (primary) hypertension Chronic combined systolic (congestive) and diastolic (congestive) heart failure Atherosclerotic heart disease of native coronary artery without angina pectoris Procedures Wound #3 Pre-procedure diagnosis of Wound #3 is a Venous Leg Ulcer located on the Left,Medial Lower Leg . There was a Four Layer Compression Therapy Procedure with a pre-treatment ABI of 1.2 by Huel Coventry, RN. Post procedure Diagnosis Wound #3: Same as Pre-Procedure Wound #4 Pre-procedure diagnosis of Wound #4 is a Venous Leg Ulcer located on the Right,Anterior Lower Leg . There was a Four Layer Compression Therapy Procedure with a pre-treatment ABI of 1.2 by Huel Coventry, RN. Post procedure Diagnosis Wound #4: Same as Pre-Procedure Plan Carranza, Devrin A.  ([id number]) Wound Cleansing: Wound #3 Left,Medial Lower Leg: Dial antibacterial soap, wash wounds, rinse and pat dry prior to dressing wounds Wound #4 Right,Anterior Lower Leg: Dial antibacterial soap, wash wounds, rinse and pat dry prior to dressing wounds Anesthetic (add to Medication List): Wound #3 Left,Medial Lower Leg: Topical Lidocaine 4% cream applied to wound bed prior to debridement (In Clinic Only). Wound #4 Right,Anterior Lower Leg: Topical Lidocaine 4% cream applied to wound bed prior to  debridement (In Clinic Only). Primary Wound Dressing: Wound #3 Left,Medial Lower Leg: Hydrafera Blue Ready Transfer Wound #4 Right,Anterior Lower Leg: Hydrafera Blue Ready Transfer Secondary Dressing: Wound #3 Left,Medial Lower Leg: XtraSorb Wound #4 Right,Anterior Lower Leg: XtraSorb Dressing Change Frequency: Wound #3 Left,Medial Lower Leg: Change dressing every week Wound #4 Right,Anterior Lower Leg: Change dressing every week Follow-up Appointments: Wound #3 Left,Medial Lower Leg: Return Appointment in 2 weeks. Nurse Visit as needed - 1 week Wound #4 Right,Anterior Lower Leg: Return Appointment in 2 weeks. Nurse Visit as needed - 1 week Edema Control: Wound #3 Left,Medial Lower Leg: 4 Layer Compression System - Bilateral - unna to anchor Elevate legs to the level of the heart and pump ankles as often as possible Wound #4 Right,Anterior Lower Leg: 4 Layer Compression System - Bilateral - unna to anchor Elevate legs to the level of the heart and pump ankles as often as possible 1. At this time would recommend that we go ahead and initiate treatment with a Hydrofera Blue dressing to both wound locations of her knee to see how this does. 2. We will utilize [place] over top. 3. I am also can I suggest that the patient actually come in for a return visit in 2 weeks although I will have him come in for a nurse visit in 1 to change out his wraps and freshen things up. 4. If he  is not doing better in 2 weeks time when I see him in regard to this left leg wound that we will contact the Coumadin clinic to see about stopping the Coumadin for a time in order to perform a biopsy without having to worry about excessive bleeding. We will see patient back for reevaluation in 2 weeks here in the clinic. If anything worsens or changes patient will contact our office for additional recommendations. Electronic Signature(s) Signed: 06/23/2020 5:17:48 PM By: Lenda KelpStone III, Oneika Simonian PA-C Entered By: Lenda KelpStone III, Diana Armijo on 06/23/2020 17:17:48 [name], Hymie A. ([id number]) -------------------------------------------------------------------------------- ROS/PFSH Details Patient Name: [name] HiltsSCHENK, Demontrae A. Date of Service: 06/22/2020 2:30 PM Medical Record Number: [id number] Patient Account Number: [id number] Date of Birth/Sex: 06/01/1931 [age] y.o. M) Treating RN: Curtis Sitesorthy, Joanna Primary Care Provider: Marcelino DusterJOHNSTON, JOHN Other Clinician: Referring Provider: Marcelino DusterJOHNSTON, JOHN Treating Provider/Extender: Linwood DibblesSTONE III, Kielan Dreisbach Weeks in Treatment: 0 Information Obtained From Patient Constitutional Symptoms (General Health) Complaints and Symptoms: Negative for: Fatigue; Fever; Chills; Marked Weight Change Ear/Nose/Mouth/Throat Complaints and Symptoms: Negative for: Difficult clearing ears; Sinusitis Hematologic/Lymphatic Complaints and Symptoms: Negative for: Bleeding / Clotting Disorders; Human Immunodeficiency Virus Respiratory Complaints and Symptoms: Negative for: Chronic or frequent coughs; Shortness of Breath Medical History: Positive for: Sleep Apnea Negative for: Aspiration; Asthma; Chronic Obstructive Pulmonary Disease (COPD); Pneumothorax; Tuberculosis Cardiovascular Complaints and Symptoms: Positive for: LE edema Negative for: Chest pain Medical History: Positive for: Congestive Heart Failure; Coronary Artery Disease; Hypertension Negative for: Angina; Arrhythmia; Deep Vein Thrombosis;  Hypotension; Myocardial Infarction; Peripheral Arterial Disease; Peripheral Venous Disease; Phlebitis; Vasculitis Past Medical History Notes: bradycardia, aortic valve replaced 2002, carotid bruits Gastrointestinal Complaints and Symptoms: Negative for: Frequent diarrhea; Nausea; Vomiting Medical History: Negative for: Cirrhosis ; Colitis; Crohnos; Hepatitis A; Hepatitis B; Hepatitis C Past Medical History Notes: hx GI bleed Endocrine Complaints and Symptoms: Negative for: Hepatitis; Thyroid disease; Polydypsia (Excessive Thirst) Genitourinary Complaints and Symptoms: Negative for: Kidney failure/ Dialysis; Incontinence/dribbling Connors, Jaxen A. ([id number]) Immunological Complaints and Symptoms: Negative for: Hives; Itching Integumentary (Skin) Complaints and Symptoms: Positive for: Wounds; Swelling Negative for: Bleeding or bruising tendency; Breakdown Medical History:  Negative for: History of Burn; History of pressure wounds Musculoskeletal Complaints and Symptoms: Negative for: Muscle Pain; Muscle Weakness Medical History: Positive for: Osteoarthritis Negative for: Gout; Rheumatoid Arthritis; Osteomyelitis Neurologic Complaints and Symptoms: Negative for: Numbness/parasthesias; Focal/Weakness Psychiatric Complaints and Symptoms: Negative for: Anxiety; Claustrophobia Eyes Medical History: Positive for: Cataracts Negative for: Glaucoma; Optic Neuritis Oncologic HBO Extended History Items Eyes: Cataracts Immunizations Pneumococcal Vaccine: Received Pneumococcal Vaccination: Yes Immunization Notes: up to date Implantable Devices Yes Family and Social History Cancer: No; Diabetes: Yes - Mother; Heart Disease: Yes - Mother,Father; Hereditary Spherocytosis: No; Hypertension: Yes - Mother,Father; Kidney Disease: No; Lung Disease: No; Seizures: No; Stroke: Yes - Father,Siblings; Thyroid Problems: No; Tuberculosis: No; Former smoker - 1975; Marital Status -  Married; Alcohol Use: Moderate; Drug Use: No History; Caffeine Use: Daily; Financial Concerns: No; Food, Clothing or Shelter Needs: No; Support System Lacking: No; Transportation Concerns: No Electronic Signature(s) Signed: 06/22/2020 5:01:28 PM By: Lenda Kelp PA-C Signed: 06/26/2020 6:49:35 PM By: Curtis Sites Entered By: Curtis Sites on 06/22/2020 14:54:48 Boxwell, Rushi A. ([id number]) -------------------------------------------------------------------------------- SuperBill Details Patient Name: [name] A. Date of Service: 06/22/2020 Medical Record Number: [id number] Patient Account Number: [id number] Date of Birth/Sex: 1930/12/09 (86 y.o. 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 I10 Essential (primary) hypertension I50.42 Chronic combined systolic (congestive) and diastolic (congestive) heart failure I25.10 Atherosclerotic heart disease of native coronary artery without angina pectoris Facility Procedures CPT4 Code: 12248250 Description: 99213 - WOUND CARE VISIT-LEV 3 EST PT Modifier: Quantity: 1 Physician Procedures CPT4 Code: 0370488 Description: WC PHYS LEVEL 3 o NEW PT Modifier: Quantity: 1 CPT4 Code: Description: ICD-10 Diagnosis Description I89.0 Lymphedema, not elsewhere classified I87.2 Venous insufficiency (chronic) (peripheral) L97.822 Non-pressure chronic ulcer of other part of left lower leg with fat l L97.812  Non-pressure chronic ulcer of  other part of right lower leg with fat Modifier: ayer exposed layer exposed Quantity: Electronic Signature(s) Signed: 06/23/2020 11:00:47 AM By: Elliot Gurney, BSN, RN, CWS, [name] RN, BSN Signed: 06/23/2020 5:18:01 PM By: Lenda Kelp PA-C Previous Signature: 06/22/2020 4:56:30 PM Version By: Lenda Kelp PA-C Entered By: Elliot Gurney, BSN, RN, CWS, [name] on 06/23/2020 11:00:47

## 2020-06-27 NOTE — Telephone Encounter (Signed)
Patient daughter calling to discuss ed visit and care plan  She says patient waited 4 hours in ed and the only thing done was warfarin adjustment and a dose of vitamin K

## 2020-06-27 NOTE — Telephone Encounter (Signed)
Returned call to pt's daughter, [name]. Stat INR was 7 yesterday and pt was advised to go to ER by [name] due to elevated INR and weight increase/edema in setting of holding diuretics due to rise in SCr.  Pt was given Vitamin K 2.5mg  in the ED and left, swelling does not seem to have been addressed in ED notes that I can see. [name] daughter states that pt was advised to restart torsemide 2 tabs BID and to not restart metolazone (both still on med list) and have BMP rechecked this Thursday.  Pt will require INR check tomorrow due to recent Vitamin K. This has been scheduled. Also ordered BMP which can be drawn tomorrow after INR check.   Will forward to [facility] as an FYI for INR check tomorrow and to [facility] to follow up with pt's daughter regarding diuretics as she still has questions about what pt should be taking.

## 2020-06-28 ENCOUNTER — Other Ambulatory Visit
Admission: RE | Admit: 2020-06-28 | Discharge: 2020-06-28 | Disposition: A | Discharge status: Home / Self Care | Payer: Medicare Other | Attending: Physician Assistant | Admitting: Physician Assistant

## 2020-06-28 ENCOUNTER — Ambulatory Visit (INDEPENDENT_AMBULATORY_CARE_PROVIDER_SITE_OTHER): Payer: Medicare Other

## 2020-06-28 DIAGNOSIS — I5032 Chronic diastolic (congestive) heart failure: Secondary | ICD-10-CM | POA: Insufficient documentation

## 2020-06-28 DIAGNOSIS — Z7901 Long term (current) use of anticoagulants: Secondary | ICD-10-CM | POA: Diagnosis not present

## 2020-06-28 DIAGNOSIS — I060 Rheumatic aortic stenosis: Secondary | ICD-10-CM | POA: Diagnosis not present

## 2020-06-28 DIAGNOSIS — N289 Disorder of kidney and ureter, unspecified: Secondary | ICD-10-CM | POA: Diagnosis present

## 2020-06-28 DIAGNOSIS — Z5181 Encounter for therapeutic drug level monitoring: Secondary | ICD-10-CM

## 2020-06-28 DIAGNOSIS — Z79899 Other long term (current) drug therapy: Secondary | ICD-10-CM | POA: Insufficient documentation

## 2020-06-28 LAB — COMPREHENSIVE METABOLIC PANEL
ALT: 12 U/L (ref 0–44)
AST: 22 U/L (ref 15–41)
Albumin: 3.6 g/dL (ref 3.5–5.0)
Alkaline Phosphatase: 105 U/L (ref 38–126)
Anion gap: 11 (ref 5–15)
BUN: 43 mg/dL — ABNORMAL HIGH (ref 8–23)
CO2: 28 mmol/L (ref 22–32)
Calcium: 8.5 mg/dL — ABNORMAL LOW (ref 8.9–10.3)
Chloride: 94 mmol/L — ABNORMAL LOW (ref 98–111)
Creatinine, Ser: 1.4 mg/dL — ABNORMAL HIGH (ref 0.61–1.24)
GFR calc Af Amer: 52 mL/min — ABNORMAL LOW (ref >60)
GFR calc non Af Amer: 45 mL/min — ABNORMAL LOW (ref >60)
Glucose, Bld: 108 mg/dL — ABNORMAL HIGH (ref 70–99)
Potassium: 4.1 mmol/L (ref 3.5–5.1)
Sodium: 133 mmol/L — ABNORMAL LOW (ref 135–145)
Total Bilirubin: 1.5 mg/dL — ABNORMAL HIGH (ref 0.3–1.2)
Total Protein: 6.6 g/dL (ref 6.5–8.1)

## 2020-06-28 LAB — PROTIME-INR
INR: 3 — ABNORMAL HIGH (ref 0.8–1.2)
Prothrombin Time: 29.9 seconds — ABNORMAL HIGH (ref 11.4–15.2)

## 2020-06-28 NOTE — Telephone Encounter (Signed)
[name] labs have resulted.   Please see anti-coag note for warfarin dosing. [name] that she will receive another call from our office regarding BMET results.

## 2020-06-28 NOTE — Telephone Encounter (Signed)
Noted.  I am not certain why they did not address the patient's volume status in the ED. He had gained 5 lbs over the weekend and 20 lbs per his primary cardiologist. Since restarting his diuretic, it appears as if his renal function has bumped again.  I will loop in the patient's primary cardiologist.

## 2020-06-28 NOTE — Telephone Encounter (Signed)
Spoke with patients daughter per release form and reviewed preliminary results. She will be attending appointment with her father next week to review results and recommendations. Will forward to provider for their review of situation as well.

## 2020-06-28 NOTE — Patient Instructions (Signed)
Please resume warfarin at new dosage: 1 tablet every day except 1/2 tablet on MONDAYS, WEDNESDAYS & FRIDAYS. Recheck in 2 weeks.

## 2020-06-29 ENCOUNTER — Other Ambulatory Visit: Payer: Self-pay

## 2020-06-29 ENCOUNTER — Encounter: Payer: Medicare Other | Attending: Physician Assistant

## 2020-06-29 DIAGNOSIS — L97812 Non-pressure chronic ulcer of other part of right lower leg with fat layer exposed: Secondary | ICD-10-CM | POA: Insufficient documentation

## 2020-06-29 DIAGNOSIS — G4730 Sleep apnea, unspecified: Secondary | ICD-10-CM | POA: Insufficient documentation

## 2020-06-29 DIAGNOSIS — M1990 Unspecified osteoarthritis, unspecified site: Secondary | ICD-10-CM | POA: Insufficient documentation

## 2020-06-29 DIAGNOSIS — Z882 Allergy status to sulfonamides status: Secondary | ICD-10-CM | POA: Insufficient documentation

## 2020-06-29 DIAGNOSIS — Z87891 Personal history of nicotine dependence: Secondary | ICD-10-CM | POA: Diagnosis not present

## 2020-06-29 DIAGNOSIS — I350 Nonrheumatic aortic (valve) stenosis: Secondary | ICD-10-CM | POA: Insufficient documentation

## 2020-06-29 DIAGNOSIS — I5042 Chronic combined systolic (congestive) and diastolic (congestive) heart failure: Secondary | ICD-10-CM | POA: Diagnosis not present

## 2020-06-29 DIAGNOSIS — L97822 Non-pressure chronic ulcer of other part of left lower leg with fat layer exposed: Secondary | ICD-10-CM | POA: Diagnosis not present

## 2020-06-29 DIAGNOSIS — Z8614 Personal history of Methicillin resistant Staphylococcus aureus infection: Secondary | ICD-10-CM | POA: Insufficient documentation

## 2020-06-29 DIAGNOSIS — Z7901 Long term (current) use of anticoagulants: Secondary | ICD-10-CM | POA: Insufficient documentation

## 2020-06-29 DIAGNOSIS — Z881 Allergy status to other antibiotic agents status: Secondary | ICD-10-CM | POA: Insufficient documentation

## 2020-06-29 DIAGNOSIS — I890 Lymphedema, not elsewhere classified: Secondary | ICD-10-CM | POA: Diagnosis not present

## 2020-06-29 DIAGNOSIS — Z950 Presence of cardiac pacemaker: Secondary | ICD-10-CM | POA: Insufficient documentation

## 2020-06-29 DIAGNOSIS — I872 Venous insufficiency (chronic) (peripheral): Secondary | ICD-10-CM | POA: Diagnosis not present

## 2020-06-29 DIAGNOSIS — I110 Hypertensive heart disease with heart failure: Secondary | ICD-10-CM | POA: Insufficient documentation

## 2020-06-29 DIAGNOSIS — I2510 Atherosclerotic heart disease of native coronary artery without angina pectoris: Secondary | ICD-10-CM | POA: Insufficient documentation

## 2020-06-29 NOTE — Progress Notes (Signed)
XYLAN, SHEILS ([id number]) Visit Report for 06/29/2020 Arrival Information Details Patient Name: [name], [name]. Date of Service: 06/29/2020 10:00 AM Medical Record Number: [id number] Patient Account Number: [id number] Date of Birth/Sex: 1930-12-22 (86 y.o. M) Treating RN: Tyler Aas Primary Care Zulema Pulaski: [name] Other Clinician: Referring Annalycia Done: [name] Treating Kischa Altice/Extender: Linwood Dibbles, HOYT Weeks in Treatment: 1 Visit Information History Since Last Visit Added or deleted any medications: No Patient Arrived: Dan Humphreys Had [name] fall or experienced change in No Arrival Time: 09:52 activities of daily living that may affect Accompanied By: self risk of falls: Transfer Assistance: None Hospitalized since last visit: No Patient Has Alerts: Yes Pain Present Now: Yes Patient Alerts: Patient on Blood Thinner Warfarin Electronic Signature(s) Signed: 06/29/2020 11:08:56 AM By: Tyler Aas Entered By: Tyler Aas on 06/29/2020 10:38:51 [name]. ([id number]) -------------------------------------------------------------------------------- Compression Therapy Details Patient Name: [name]. Date of Service: 06/29/2020 10:00 AM Medical Record Number: [id number] Patient Account Number: [id number] Date of Birth/Sex: 1931-10-29 (86 y.o. M) Treating RN: Tyler Aas Primary Care Ramyah Pankowski: [name] Other Clinician: Referring Shaquanda Graves: [name] Treating Oza Oberle/Extender: Linwood Dibbles, HOYT Weeks in Treatment: 1 Compression Therapy Performed for Wound Assessment: Wound #5 Right,Medial Lower Leg Performed By: Alfonse Spruce, RN Compression Type: Four Layer Pre Treatment ABI: 1.2 Electronic Signature(s) Signed: 06/29/2020 11:08:56 AM By: Tyler Aas Entered By: Tyler Aas on 06/29/2020 10:44:14 [name].  ([id number]) -------------------------------------------------------------------------------- Compression Therapy Details Patient Name: [name]. Date of Service: 06/29/2020 10:00 AM Medical Record Number: [id number] Patient Account Number: [id number] Date of Birth/Sex: [birth year] (86 y.o. M) Treating RN: Tyler Aas Primary Care Dailee Manalang: [name] Other Clinician: Referring Khadir Roam: [name] Treating Jacque Byron/Extender: Linwood Dibbles, HOYT Weeks in Treatment: 1 Compression Therapy Performed for Wound Assessment: Wound #3 Left,Medial Lower Leg Performed By: Alfonse Spruce, RN Compression Type: Four Layer Pre Treatment ABI: 1.2 Electronic Signature(s) Signed: 06/29/2020 11:08:56 AM By: Tyler Aas Entered By: Tyler Aas on 06/29/2020 10:44:14 [name]. ([id number]) -------------------------------------------------------------------------------- Compression Therapy Details Patient Name: [name]. Date of Service: 06/29/2020 10:00 AM Medical Record Number: [id number] Patient Account Number: [id number] Date of Birth/Sex: 11-11-1931 (86 y.o. M) Treating RN: Tyler Aas Primary Care [place]: [name] Other Clinician: Referring Becki Mccaskill: [name] Treating Upton Russey/Extender: Linwood Dibbles, HOYT Weeks in Treatment: 1 Compression Therapy Performed for Wound Assessment: Wound #4 Right,Anterior Lower Leg Performed By: Alfonse Spruce, RN Compression Type: Four Layer Pre Treatment ABI: 1.2 Electronic Signature(s) Signed: 06/29/2020 11:08:56 AM By: Tyler Aas Entered By: Tyler Aas on 06/29/2020 10:44:14 [name]. ([id number]) -------------------------------------------------------------------------------- Encounter Discharge Information Details Patient Name: [name]. Date of Service: 06/29/2020 10:00 AM Medical Record Number: [id number] Patient Account Number: [id number] Date of Birth/Sex:  1930/12/25 (86 y.o. M) Treating RN: Tyler Aas Primary Care Eathen Budreau: [name] Other Clinician: Referring Emit Kuenzel: [name] Treating Ervey Fallin/Extender: Linwood Dibbles, HOYT Weeks in Treatment: 1 Encounter Discharge Information Items Discharge Condition: Stable Ambulatory Status: Walker Discharge Destination: Home Transportation: Private Auto Accompanied By: self Schedule Follow-up Appointment: Yes Clinical Summary of Care: Electronic Signature(s) Signed: 06/29/2020 11:08:56 AM By: Tyler Aas Entered By: Tyler Aas on 06/29/2020 10:45:38 [name]. ([id number]) -------------------------------------------------------------------------------- Pain Assessment Details Patient Name: [name]. Date of Service: 06/29/2020 10:00 AM Medical Record Number: [id number] Patient Account Number: [id number] Date of Birth/Sex: [birth year] (86 y.o. M) Treating RN: Tyler Aas Primary Care Venus Gilles: [name] Other Clinician: Referring Jovie Swanner: [name] Treating Abrahan Fulmore/Extender: Linwood Dibbles, HOYT Weeks in Treatment: 1 Active Problems  Location of Pain Severity and Description of Pain Patient Has Paino Yes Site Locations Pain Location: Pain in Ulcers Duration of the Pain. Constant / Intermittento Intermittent Rate the pain. Current Pain Level: 5 Character of Pain Describe the Pain: Shooting Pain Management and Medication Current Pain Management: Rest: Yes Electronic Signature(s) Signed: 06/29/2020 11:08:56 AM By: Tyler Aas Entered By: Tyler Aas on 06/29/2020 09:54:38 [name], [name]. ([id number]) -------------------------------------------------------------------------------- Wound Assessment Details Patient Name: [name]. Date of Service: 06/29/2020 10:00 AM Medical Record Number: [id number] Patient Account Number: [id number] Date of Birth/Sex: [birth year] (86 y.o. M) Treating RN: Tyler Aas Primary Care  Lawton Dollinger: [name] Other Clinician: Referring Zariana Strub: [name] Treating Maryellen Dowdle/Extender: Linwood Dibbles, HOYT Weeks in Treatment: 1 Wound Status Wound Number: 3 Primary Venous Leg Ulcer Etiology: Wound Location: Left, Medial Lower Leg Secondary Lymphedema Wounding Event: Gradually Appeared Etiology: Date Acquired: 02/24/2020 Wound Open Weeks Of Treatment: 1 Status: Clustered Wound: No Comorbid Cataracts, Sleep Apnea, Congestive Heart Failure, History: Coronary Artery Disease, Hypertension, Osteoarthritis Photos Wound Measurements Length: (cm) 3 Width: (cm) 2.5 Depth: (cm) 0.2 Area: (cm) 5.89 Volume: (cm) 1.178 % Reduction in Area: -174.7% % Reduction in Volume: -174.6% Epithelialization: None Tunneling: No Undermining: No Wound Description Classification: Full Thickness Without Exposed Support Structu Wound Margin: Flat and Intact Exudate Amount: Medium Exudate Type: Serosanguineous Exudate Color: red, brown res Foul Odor After Cleansing: No Slough/Fibrino Yes Wound Bed Granulation Amount: Medium (34-66%) Exposed Structure Granulation Quality: Red Fascia Exposed: No Necrotic Amount: Medium (34-66%) Fat Layer (Subcutaneous Tissue) Exposed: Yes Necrotic Quality: Adherent Slough Tendon Exposed: No Muscle Exposed: No Joint Exposed: No Bone Exposed: No Treatment Notes Wound #3 (Left, Medial Lower Leg) Notes zinc to peri wound area, HB, xtrasorb, abd, 4 layer wrap Dedic, Rees [name]. ([id number]) Electronic Signature(s) Signed: 06/29/2020 11:08:56 AM By: Tyler Aas Entered By: Tyler Aas on 06/29/2020 10:40:51 [name]. ([id number]) -------------------------------------------------------------------------------- Wound Assessment Details Patient Name: [name]. Date of Service: 06/29/2020 10:00 AM Medical Record Number: [id number] Patient Account Number: [id number] Date of Birth/Sex: 10/29/1931 (86 y.o. M) Treating RN: Tyler Aas Primary Care Yarenis Cerino: [name] Other Clinician: Referring Dyshawn Cangelosi: [name] Treating Abagael Kramm/Extender: Linwood Dibbles, HOYT Weeks in Treatment: 1 Wound Status Wound Number: 4 Primary Venous Leg Ulcer Etiology: Wound Location: Right, Anterior Lower Leg Secondary Lymphedema Wounding Event: Gradually Appeared Etiology: Date Acquired: 06/05/2020 Wound Open Weeks Of Treatment: 1 Status: Clustered Wound: No Comorbid Cataracts, Sleep Apnea, Congestive Heart Failure, History: Coronary Artery Disease, Hypertension, Osteoarthritis Photos Wound Measurements Length: (cm) 0.2 Width: (cm) 0.2 Depth: (cm) 0.1 Area: (cm) 0.031 Volume: (cm) 0.003 % Reduction in Area: 0% % Reduction in Volume: 0% Epithelialization: None Tunneling: No Undermining: No Wound Description Classification: Full Thickness Without Exposed Support Structu Wound Margin: Flat and Intact Exudate Amount: Large Exudate Type: Serous Exudate Color: amber res Foul Odor After Cleansing: No Slough/Fibrino Yes Wound Bed Granulation Amount: Medium (34-66%) Exposed Structure Granulation Quality: Red Fascia Exposed: No Necrotic Amount: Medium (34-66%) Fat Layer (Subcutaneous Tissue) Exposed: Yes Necrotic Quality: Adherent Slough Tendon Exposed: No Muscle Exposed: No Joint Exposed: No Bone Exposed: No Treatment Notes Wound #4 (Right, Anterior Lower Leg) Notes zinc to peri wound area, HB, xtrasorb, abd, 4 layer wrap [place], Jaysen [name]. ([id number]) Electronic Signature(s) Signed: 06/29/2020 11:08:56 AM By: Tyler Aas Entered By: Tyler Aas on 06/29/2020 10:41:42 [name], Shiva [name]. ([id number]) -------------------------------------------------------------------------------- Wound Assessment Details Patient Name: [name]. Date of Service: 06/29/2020 10:00 AM Medical Record Number: [id number] Patient Account Number: [id number]  Date of Birth/Sex: 05/11/1931 (86 y.o. M) Treating RN:  Tyler Aas Primary Care Alondria Mousseau: [name] Other Clinician: Referring Jahmiyah Dullea: [name] Treating Dionicia Cerritos/Extender: Linwood Dibbles, HOYT Weeks in Treatment: 1 Wound Status Wound Number: 5 Primary Venous Leg Ulcer Etiology: Wound Location: Right, Medial Lower Leg Wound Open Wounding Event: Gradually Appeared Status: Date Acquired: 06/26/2020 Comorbid Cataracts, Sleep Apnea, Congestive Heart Failure, Weeks Of Treatment: 0 History: Coronary Artery Disease, Hypertension, Osteoarthritis Clustered Wound: No Photos Wound Measurements Length: (cm) 0.3 % Red Width: (cm) 0.2 % Red Depth: (cm) 0.1 Epith Area: (cm) 0.047 Tunn Volume: (cm) 0.005 Unde uction in Area: uction in Volume: elialization: None eling: No rmining: No Wound Description Classification: Partial Thickness Foul Wound Margin: Flat and Intact Sloug Exudate Amount: Medium Exudate Type: Serous Exudate Color: amber Odor After Cleansing: No h/Fibrino No Wound Bed Granulation Amount: Small (1-33%) Exposed Structure Granulation Quality: Red Fascia Exposed: No Necrotic Amount: Small (1-33%) Fat Layer (Subcutaneous Tissue) Exposed: Yes Necrotic Quality: Adherent Slough Tendon Exposed: No Muscle Exposed: No Joint Exposed: No Bone Exposed: No Treatment Notes Wound #5 (Right, Medial Lower Leg) Notes zinc to peri wound area, HB, xtrasorb, abd, 4 layer wrap Electronic Signature(s) Signed: 06/29/2020 11:08:56 AM By: Sarajane Marek, Terek [name]. ([id number]) Entered By: Tyler Aas on 06/29/2020 10:43:32

## 2020-06-29 NOTE — Telephone Encounter (Signed)
  Pam, can we call him Appt with [facility] 8/9 Worsening renal function past 2 months Concern for either taking NSAIDs at home in the setting of gout past few months Or cardiorenal syndrome given weight has gone 205 now 220 Would recommend he get back on his torsemide 40 twice daily with metolazone 2-3 times per week to get weight down to his baseline If no improvement by appointment August 9 May need dose of IV Lasix through same-day surgery Could do 2 or 3 days in a row if needed We will try to avoid emergency room if we can

## 2020-06-30 ENCOUNTER — Telehealth: Payer: Self-pay | Admitting: Cardiovascular Disease

## 2020-06-30 NOTE — Telephone Encounter (Signed)
Patient daughter kim returning call

## 2020-06-30 NOTE — Telephone Encounter (Signed)
Pt c/o swelling: STAT is pt has developed SOB within 24 hours  1) How much weight have you gained and in what time span? 3 pounds over night  2) If swelling, where is the swelling located? Both legs and stomach  3) Are you currently taking a fluid pill? yes  4) Are you currently SOB? yes  5) Do you have a log of your daily weights (if so, list)?  220 8/6 218 8/6 an hour later Typically runs 215  6) Have you gained 3 pounds in a day or 5 pounds in a week? Yes, 3 lbs overnight  7) Have you traveled recently? n/a

## 2020-06-30 NOTE — Telephone Encounter (Signed)
See other telephone encounter from 8/3

## 2020-06-30 NOTE — Telephone Encounter (Signed)
Spoke with patient and he reports weight is up to 218 today. Reviewed that previous weight was 220 and documentation of 226. Reviewed provider recommendations and patient confirmed he is taking torsemide 40 mg twice a day. He denies use of NSAID's and is on allopurinol for his gout. He will try metolazone today and then again on Sunday. Reviewed at upcoming appointment he may need some IV furosemide and explained what and how this is done. He verbalized understanding of our conversation, agreement with plan, and had no further questions at this time.

## 2020-06-30 NOTE — Telephone Encounter (Signed)
Left voicemail message to call back  

## 2020-06-30 NOTE — Telephone Encounter (Signed)
Reviewed provider recommendations for her father which is all under 06/27/20 telephone encounter. She verbalized understanding of our conversation, plan of care, and had no further questions at this time.

## 2020-07-03 ENCOUNTER — Ambulatory Visit
Admission: RE | Admit: 2020-07-03 | Discharge: 2020-07-03 | Disposition: A | Discharge status: Home / Self Care | Payer: Medicare Other | Source: Ambulatory Visit | Attending: Family | Admitting: Family

## 2020-07-03 ENCOUNTER — Ambulatory Visit (INDEPENDENT_AMBULATORY_CARE_PROVIDER_SITE_OTHER): Payer: Medicare Other

## 2020-07-03 ENCOUNTER — Other Ambulatory Visit: Payer: Self-pay | Admitting: Family

## 2020-07-03 ENCOUNTER — Other Ambulatory Visit: Payer: Self-pay

## 2020-07-03 ENCOUNTER — Ambulatory Visit: Payer: Medicare Other | Admitting: Physician Assistant

## 2020-07-03 ENCOUNTER — Encounter: Payer: Self-pay | Admitting: Family

## 2020-07-03 ENCOUNTER — Ambulatory Visit: Payer: Medicare Other | Admitting: Family

## 2020-07-03 VITALS — BP 116/64 | HR 60 | Ht 71.0 in | Wt 224.1 lb

## 2020-07-03 DIAGNOSIS — I350 Nonrheumatic aortic (valve) stenosis: Secondary | ICD-10-CM

## 2020-07-03 DIAGNOSIS — I5033 Acute on chronic diastolic (congestive) heart failure: Secondary | ICD-10-CM | POA: Diagnosis not present

## 2020-07-03 DIAGNOSIS — Z7901 Long term (current) use of anticoagulants: Secondary | ICD-10-CM | POA: Diagnosis not present

## 2020-07-03 DIAGNOSIS — Z952 Presence of prosthetic heart valve: Secondary | ICD-10-CM | POA: Diagnosis not present

## 2020-07-03 DIAGNOSIS — Z79899 Other long term (current) drug therapy: Secondary | ICD-10-CM | POA: Diagnosis not present

## 2020-07-03 DIAGNOSIS — I2510 Atherosclerotic heart disease of native coronary artery without angina pectoris: Secondary | ICD-10-CM | POA: Diagnosis not present

## 2020-07-03 LAB — POCT INR: INR: 4.2 — AB (ref 2.0–3.0)

## 2020-07-03 MED ORDER — FUROSEMIDE 10 MG/ML IJ SOLN
80.0000 mg | Freq: Once | INTRAMUSCULAR | Status: AC
  Administered 2020-07-03: 80 mg via INTRAVENOUS

## 2020-07-03 MED ORDER — POTASSIUM CHLORIDE CRYS ER 20 MEQ PO TBCR
40.0000 meq | EXTENDED_RELEASE_TABLET | Freq: Once | ORAL | Status: AC
  Administered 2020-07-03: 40 meq via ORAL

## 2020-07-03 NOTE — Progress Notes (Signed)
Office Visit    Patient Name: [name] Date of Encounter: 07/03/2020  Primary Care Provider:  Gracelyn Nurse, MD Primary Cardiologist:  Julien Nordmann, MD Electrophysiologist:  None   Chief Complaint    [name] is a 86 y.o. male with a hx of permanent atrial fibrillation, aortic stenosis s/p mechanical AVR on chronic Coumadin, chronic diastolic heart failure, nonobstructive coronary artery disease, HTN, HLD, GERD, chronic dyspnea, chronic venous stasis presents today for HFpEF follow up.   Past Medical History    Past Medical History:  Diagnosis Date  . Anxiety 10/11  . Aortic stenosis    a. s/p mechcanical AVR, 2002; b. 10/2018 Echo: Triv AI, mean grad .  . Bradycardia    chronic, no symptoms 07/2010  . C. difficile colitis   . Carotid bruit    dopplers in past, no abnormalities  . Chronic diastolic CHF (congestive heart failure) (HCC)    a. Echo 03/2015: EF 60-65%, no RWMA, GR1DD, mild BAE, mild to mod MR, mod TR, PASP 65 mmHg; b. 01/2017 Echo: EF 55-60%, NRWMA, grade 1 diastolic dysfunction.  Normal functioning prosthetic aortic valve.  Mean gradient 50 mmHg.  Sev TR. PASP ; c. 10/2018 Echo: EF 55-60%, Triv AI, mod dil LA, mod-sev TR, PASP 35-40, mild to mod red RV fxn.  . Coronary artery disease    a. mild, cath, 08/2010; b. medically managed  . Decreased hearing    Right ear  . Depression   . Gastric ulcer   . GERD (gastroesophageal reflux disease)   . Hypertension    BP higher than usual 04/19/10; amlodipine increased by telephone  . Mod-Sev Tricuspid regurgitation    a. 10/2018 Echo: Mod-Sev TR, PASP 35-67mmHg.  Marland Kitchen RLS (restless legs syndrome) 08/23/2015  . S/P AVR    a. [employer]. mechanical 2002; b. echo 08/2010 EF 60%, trival AI, mild MR, AVR working well; c. on longterm warfarin tx  . SOB (shortness of breath) 10/11   08/2010,Episodes at 5 AM, eventually felt to be anxiety, after complete workup including catheterization, pt  greatly improved with anxiety meds 11/11   Past Surgical History:  Procedure Laterality Date  . CARDIAC CATHETERIZATION    . ESOPHAGOGASTRODUODENOSCOPY N/A 04/05/2015   Procedure: ESOPHAGOGASTRODUODENOSCOPY (EGD);  Surgeon: Scot Jun, MD;  Location: [facility] ENDOSCOPY;  Service: Endoscopy;  Laterality: N/A;  . ESOPHAGOGASTRODUODENOSCOPY N/A 04/17/2015   Procedure: ESOPHAGOGASTRODUODENOSCOPY (EGD);  Surgeon: Scot Jun, MD;  Location: [facility] ENDOSCOPY;  Service: Endoscopy;  Laterality: N/A;  . ESOPHAGOGASTRODUODENOSCOPY N/A 08/02/2015   Procedure: ESOPHAGOGASTRODUODENOSCOPY (EGD);  Surgeon: Wallace Cullens, MD;  Location: [facility] ENDOSCOPY;  Service: Endoscopy;  Laterality: N/A;  . HERNIA REPAIR    . JOINT REPLACEMENT    . TOTAL HIP ARTHROPLASTY    . VALVE REPLACEMENT  1/02   Aortic; echo 3/09 valve working well; echo 10/11 working well; put on Coumadin    Allergies  Allergies  Allergen Reactions  . Sulfa Antibiotics Other (See Comments)    Reaction:  Unknown   . Levofloxacin Nausea Only    History of Present Illness    [name] is a 86 y.o. male with a hx of permanent atrial fibrillation, aortic stenosis s/p mechanical AVR on chronic Coumadin, chronic diastolic heart failure, nonobstructive coronary artery disease, HTN, HLD, GERD, chronic dyspnea, chronic venous stasis.  He was last seen 06/23/2020 by Leafy Kindle, PA.  September 2020 his torsemide was adjusted to 40 mg twice daily and metolazone  added 3 times per week.  Seen November 2020 with weight down to 206 pounds and present diuretic doses were maintained.  Seen February 2020 and was stable chronic dyspnea on exertion.  Weight trending 195 pounds on home scale.  He was 208.4 pounds on clinic scale.  Using metolazone once per week.  He was recommended to increase to metolazone twice per week.  Seen 02/16/2020 with weight stable on present diuretic regimen.  He was recommended to continue torsemide 40 mg twice daily  metolazone 2.5 mg twice per week.  On July 21 of this year he called the office to report weight of 205 pounds and "weeping" of his lower extremities. Weight up to 220 lbs by office notes. Recommended by Dr. Mariah MillingGollan for Metolazone 3 days in a row with Torsemide 40mg  BID and extra potassium each day.    He was advised to take metolazone and torsemide with extra dose as directed by Dr. Mariah MillingGollan.  Recommended for metolazone 2.5 mg for 3 days noted with torsemide 40 mg twice daily and extra potassium tablet each day. Repeat BMP with elevated BUN and creatinine. Recommended to hold diuretics for 2 days.   Seen in clinic 06/23/2020 reported continuing lower extremity edema and abdominal distention.  Noted leg cramping.  He had been holding his fluid pill for 1 day. He was recommended to resume previous doses of Torsemide 40mg  BID and Metolazone three times per week.  However he was also noted to have a supratherapeutic INR and sent to ED for evaluation.  Chest x-ray 06/26/2020 with mild central pulmonary vascular congestion with bibasilar pulmonary edema.  Repeat lab work 06/28/2020 with potassium 4.1, creatinine 1.4, AST 22, ALT 12, GFR 45, INR 3  [facility]  He follows with vascular as well as wound care.  Date Weight  07/03/20 224 lb  06/23/20 221 lb  02/16/20 206 lb   Presents today for follow-up.  Reports continued lower extremity edema.  Does note that his RLE Roland RackUnna boot is getting him significant pain at the top of the dressing likely where there is majority of the swelling.  He was scheduled for wound care clinic today but this was canceled by the office and rescheduled for Thursday.  Reports no chest pain, pressure, tightness.  Reports no shortness of breath at rest.  Does endorse dyspnea on exertion.  Reports no orthopnea, PND.  He continues to take torsemide 40 mg twice daily.  He took his metolazone on Friday as well as Sunday.  His weight today is up 4 pounds from clinic visit  approximately 1 week ago.  EKGs/Labs/Other Studies Reviewed:   The following studies were reviewed today:  Echo 01/2020  1. Left ventricular ejection fraction, by estimation, is 55 to 60%. The  left ventricle has normal function. The left ventricle has no regional  wall motion abnormalities. Left ventricular diastolic function could not  be evaluated.   2. Right ventricular systolic function is normal. The right ventricular  size is mildly enlarged.   3. Left atrial size was severely dilated.   4. Right atrial size was severely dilated.   5. The mitral valve is degenerative. Mild mitral valve regurgitation.   6. Tricuspid valve regurgitation is moderate to severe.   7. The aortic valve has been repaired/replaced. Aortic valve  regurgitation is not visualized. There is a mechanical valve present in  the aortic position. Procedure Date: 2002. Echo findings are consistent  with normal structure and function of the aortic  valve prosthesis.    EKG:  EKG is ordered today.  The ekg ordered today demonstrates rate controlled atrial fibrillation 60 bpm with known RBBB.  No acute ST/T wave changes.  Recent Labs: 06/23/2020: Magnesium 2.4 06/26/2020: Hemoglobin 10.8; Platelets 288 06/28/2020: ALT 12; BUN 43; Creatinine, Ser 1.40; Potassium 4.1; Sodium 133  Recent Lipid Panel No results found for: CHOL, TRIG, HDL, CHOLHDL, VLDL, LDLCALC, LDLDIRECT  Home Medications   Current Meds  Medication Sig  . allopurinol (ZYLOPRIM) 300 MG tablet One tab daily, 90 tabs  . amoxicillin (AMOXIL) 500 MG capsule 4 CAPSULES PRIOR TO DENTAL PROCEDURES AS DIRECTED  . Cetirizine HCl 10 MG CAPS Take 10 mg by mouth daily.  . citalopram (CELEXA) 20 MG tablet Take 20 mg by mouth daily.  . cloNIDine (CATAPRES) 0.1 MG tablet TAKE 1 TABLET BY MOUTH  TWICE DAILY  . doxazosin (CARDURA) 8 MG tablet Take 8 mg by mouth 2 (two) times daily.   . finasteride (PROSCAR) 5 MG tablet Take 5 mg by mouth daily.    . isosorbide  mononitrate (IMDUR) 60 MG 24 hr tablet Take 1 tablet (60 mg total) by mouth 2 (two) times daily.  . metolazone (ZAROXOLYN) 2.5 MG tablet Take 1 tablet (2.5 mg total) by mouth 2 (two) times a week.  . metoprolol succinate (TOPROL-XL) 25 MG 24 hr tablet Take 0.5 tablets (12.5 mg total) by mouth daily.  . potassium chloride (KLOR-CON) 10 MEQ tablet TAKE 3 TABLETS (30 MEQ) BY MOUTH DAILY. TAKE AN ADDITIONAL 2TABS ON THE DAYS YOU TAKE METOLAZONE  . rOPINIRole (REQUIP) 2 MG tablet TAKE 1 TABLET BY MOUTH 4  TIMES DAILY  . Tamsulosin HCl (FLOMAX) 0.4 MG CAPS Take 0.4 mg by mouth at bedtime.   . torsemide (DEMADEX) 20 MG tablet TAKE 2 TABLETS BY MOUTH  TWICE DAILY  . traMADol (ULTRAM) 50 MG tablet Take 50 mg by mouth 2 (two) times daily as needed.  . traZODone (DESYREL) 50 MG tablet Take 50 mg by mouth at bedtime.  Marland Kitchen warfarin (COUMADIN) 5 MG tablet TAKE 1 TABLET BY MOUTH  DAILY OR AS DIRECTED BY THE ANTICOAGULATION CLINIC    Review of Systems    Review of Systems  Constitutional: Positive for malaise/fatigue. Negative for chills and fever.  Cardiovascular: Positive for dyspnea on exertion and leg swelling. Negative for chest pain, irregular heartbeat, near-syncope, orthopnea, palpitations and syncope.  Respiratory: Negative for cough, shortness of breath and wheezing.   Gastrointestinal: Negative for melena, nausea and vomiting.  Genitourinary: Negative for hematuria.  Neurological: Negative for dizziness, light-headedness and weakness.   All other systems reviewed and are otherwise negative except as noted above.  Physical Exam    VS:  BP 116/64 (BP Location: Right Arm, Patient Position: Sitting, Cuff Size: Normal)   Pulse 60   Ht 5\' 11"  (1.803 m)   Wt 224 lb 2 oz (101.7 kg)   SpO2 97%   BMI 31.26 kg/m  , BMI Body mass index is 31.26 kg/m. GEN: Well nourished, well developed, in no acute distress. HEENT: normal. Neck: Supple, no JVD, carotid bruits, or masses. Cardiac: irregularly  irregular, no murmurs, rubs, or gallops. No clubbing, cyanosis. 2+ pitting edema to the top of his Unna boot..  Radials2+ and equal bilaterally.  Respiratory:  Respirations regular and unlabored, clear to auscultation bilaterally. GI: Soft, nontender, nondistended, BS + x 4. MS: No deformity or atrophy. Skin: Warm and dry, no rash. Unna boot to bilateral LE Neuro:  Strength and  sensation are intact. Psych: Normal affect.  Assessment & Plan    1. Chronic diastolic heart failure/CKDIII -Echo 02/11/2020 with EF 55 to 60%.  Weight up 4 pounds from clinic visit 1 week ago.  Titration of diuretics has been difficult in the setting of acute kidney injury likely cardiorenal syndrome.  Sent to short stay today for 80 mg of IV Lasix with 40 mEq of potassium.  Continue torsemide 40 mg twice daily.  He will resume his metolazone 2-3 times per week on Wednesday.  He will call our office Wednesday to report his weight.  BMP Thursday.  His weight today in clinic was 224 pounds and his approximate dry weight is 206 pounds.  Careful titration of diuretics in the setting of kidney disease.  Most recent lab work 06/28/2020 with creatinine 1.4, GFR 45, BUN of 43.   2. HTN - BP well controlled. Continue current antihypertensive regimen.   3. Nonobstructive CAD -stable with no anginal symptoms.  EKG today with no acute ST/T wave changes.  No indication for ischemic evaluation this time.  Continue GDMT including beta-blocker, Imdur.  No aspirin secondary to chronic anticoagulation  4. Permanent atrial fibrillation/chronic anticoagulation - INR supratherapeutic today with INR 4.2 - dose adjusted by [facility]madin Clinic. Denies bleeding complications.  5. S/p AVR - Continue SBE prophylaxis.  Continue warfarin, as above.  Disposition: To short stay for IV diuresis. Follow up in 1 week(s) with Dr. Mariah Milling or APP   Alver Sorrow, NP 07/03/2020, 3:59 PM

## 2020-07-03 NOTE — Patient Instructions (Signed)
-   skip warfarin today and tomorrow,  - on Wednesday, START NEW WARFARIN DOSAGE of 1/2 tablet every day except 1 tablet on MONDAYS, WEDNESDAYS & FRIDAYS.  - Recheck in 2 weeks.

## 2020-07-03 NOTE — Progress Notes (Signed)
Orders placed for Lasix 80mg  IV once, Potassium chloride PO once, insert peripheral IV.   , NP

## 2020-07-03 NOTE — Patient Instructions (Addendum)
Call on Wednesday to let us know your daily weights.   Medication Instructions:  Your physician recommends that you continue on your current medications as directed. Please refer to the Current Medication list given to you today.  Continue Torsemide as prescribed two times a day. Take another dose of Metolazone on Wednesday.  *If you need a refill on your cardiac medications before your next appointment, please call your pharmacy*   Lab Work: Your physician recommends that you return for lab work on Thursday, July 06, 2020 for BMET.  - Please go to the [facility]. You will check in at the front desk to the right as you walk into the atrium. Valet Parking is offered if needed. - No appointment needed. You may go any day between 7 am and 6 pm.  If you have labs (blood work) drawn today and your tests are completely normal, you will receive your results only by: Marland Kitchen MyChart Message (if you have MyChart) OR . A paper copy in the mail If you have any lab test that is abnormal or we need to change your treatment, we will call you to review the results.   Testing/Procedures:  To Short Stay for IV lasix.   Follow-Up: At [facility], you and your health needs are our priority.  As part of our continuing mission to provide you with exceptional heart care, we have created designated Provider Care Teams.  These Care Teams include your primary Cardiologist (physician) and Advanced Practice Providers (APPs -  Physician Assistants and Nurse Practitioners) who all work together to provide you with the care you need, when you need it.  We recommend signing up for the patient portal called "MyChart".  Sign up information is provided on this After Visit Summary.  MyChart is used to connect with patients for Virtual Visits (Telemedicine).  Patients are able to view lab/test results, encounter notes, upcoming appointments, etc.  Non-urgent messages can be sent to your provider as well.   To learn  more about what you can do with MyChart, go to [email or web address].    Your next appointment:   1 week(s)  The format for your next appointment:   In Person  Provider:    You may see Julien Nordmann, MD or one of the following Advanced Practice Providers on your designated Care Team:    Nicolasa Ducking, NP  Eula Listen, PA-C  [name], PA-C

## 2020-07-04 DIAGNOSIS — I872 Venous insufficiency (chronic) (peripheral): Secondary | ICD-10-CM | POA: Diagnosis not present

## 2020-07-04 DIAGNOSIS — I350 Nonrheumatic aortic (valve) stenosis: Secondary | ICD-10-CM | POA: Diagnosis not present

## 2020-07-04 DIAGNOSIS — I2510 Atherosclerotic heart disease of native coronary artery without angina pectoris: Secondary | ICD-10-CM | POA: Diagnosis not present

## 2020-07-04 DIAGNOSIS — I110 Hypertensive heart disease with heart failure: Secondary | ICD-10-CM | POA: Diagnosis not present

## 2020-07-04 DIAGNOSIS — Z881 Allergy status to other antibiotic agents status: Secondary | ICD-10-CM | POA: Diagnosis not present

## 2020-07-04 DIAGNOSIS — L97822 Non-pressure chronic ulcer of other part of left lower leg with fat layer exposed: Secondary | ICD-10-CM | POA: Diagnosis not present

## 2020-07-04 DIAGNOSIS — G4730 Sleep apnea, unspecified: Secondary | ICD-10-CM | POA: Diagnosis not present

## 2020-07-04 DIAGNOSIS — I5042 Chronic combined systolic (congestive) and diastolic (congestive) heart failure: Secondary | ICD-10-CM | POA: Diagnosis not present

## 2020-07-04 DIAGNOSIS — M1990 Unspecified osteoarthritis, unspecified site: Secondary | ICD-10-CM | POA: Diagnosis not present

## 2020-07-04 DIAGNOSIS — I890 Lymphedema, not elsewhere classified: Secondary | ICD-10-CM | POA: Diagnosis not present

## 2020-07-04 DIAGNOSIS — Z950 Presence of cardiac pacemaker: Secondary | ICD-10-CM | POA: Diagnosis not present

## 2020-07-04 DIAGNOSIS — Z8614 Personal history of Methicillin resistant Staphylococcus aureus infection: Secondary | ICD-10-CM | POA: Diagnosis not present

## 2020-07-04 DIAGNOSIS — L97812 Non-pressure chronic ulcer of other part of right lower leg with fat layer exposed: Secondary | ICD-10-CM | POA: Diagnosis present

## 2020-07-04 DIAGNOSIS — Z87891 Personal history of nicotine dependence: Secondary | ICD-10-CM | POA: Diagnosis not present

## 2020-07-04 DIAGNOSIS — Z882 Allergy status to sulfonamides status: Secondary | ICD-10-CM | POA: Diagnosis not present

## 2020-07-04 DIAGNOSIS — Z7901 Long term (current) use of anticoagulants: Secondary | ICD-10-CM | POA: Diagnosis not present

## 2020-07-05 ENCOUNTER — Telehealth: Payer: Self-pay | Admitting: Cardiovascular Disease

## 2020-07-05 NOTE — Progress Notes (Signed)
[name] ([id number]) Visit Report for 07/04/2020 Arrival Information Details Patient Name: [name], [name] Date of Service: 07/04/2020 1:00 PM Medical Record Number: [id number] Patient Account Number: [id number] Date of Birth/Sex: [birth year] (86 y.o. M) Treating RN: [name] Primary Care [name]: [name] Other Clinician: Referring [name]: [name] Treating [name]: [name] Weeks in Treatment: 1 Visit Information History Since Last Visit Added or deleted any medications: No Patient Arrived: [name] Had a fall or experienced change in No Arrival Time: 12:59 activities of daily living that may affect Accompanied By: daughter risk of falls: Transfer Assistance: None Signs or symptoms of abuse/neglect since last visito No Patient Has Alerts: Yes Pain Present Now: No Patient Alerts: Patient on Blood Thinner Warfarin Electronic Signature(s) Signed: 07/05/2020 7:58:06 AM By: [name] Entered By: [name] on 07/04/2020 13:01:57 [name] Hilts A. ([id number]) -------------------------------------------------------------------------------- Compression Therapy Details Patient Name: [name] Hilts A. Date of Service: 07/04/2020 1:00 PM Medical Record Number: [id number] Patient Account Number: [id number] Date of Birth/Sex: [birth year] (86 y.o. M) Treating RN: [name] Primary Care Bryah Ocheltree: [name] Other Clinician: Referring [name]: [name] Treating [name]: [name] Weeks in Treatment: 1 Compression Therapy Performed for Wound Assessment: Wound #3 Left,Medial Lower Leg Performed By: Clinician [name] Aas, RN Compression Type: Three Layer Pre Treatment ABI: 1.2 Electronic Signature(s) Signed: 07/05/2020 7:58:06 AM By: [name] Entered By: [name] on 07/04/2020 13:35:03 [name] Hilts A.  ([id number]) -------------------------------------------------------------------------------- Compression Therapy Details Patient Name: [name] Hilts A. Date of Service: 07/04/2020 1:00 PM Medical Record Number: [id number] Patient Account Number: [id number] Date of Birth/Sex: [birth year] (86 y.o. M) Treating RN: [name] Primary Care Hema Lanza: [name] Other Clinician: Referring Tresea Heine: [name] Treating Alekxander Isola/Extender: [name] Weeks in Treatment: 1 Compression Therapy Performed for Wound Assessment: Wound #4 Right,Anterior Lower Leg Performed By: Clinician [name] Aas, RN Compression Type: Three Layer Pre Treatment ABI: 1.2 Electronic Signature(s) Signed: 07/05/2020 7:58:06 AM By: [name] Entered By: [name] on 07/04/2020 13:35:03 [name] Hilts A. ([id number]) -------------------------------------------------------------------------------- Compression Therapy Details Patient Name: [name] Hilts A. Date of Service: 07/04/2020 1:00 PM Medical Record Number: [id number] Patient Account Number: [id number] Date of Birth/Sex: [birth year] (86 y.o. M) Treating RN: [name] Primary Care Idamay Hosein: [name] Other Clinician: Referring [name]: [name] Treating [name]: [name] Weeks in Treatment: 1 Compression Therapy Performed for Wound Assessment: Wound #5 Right,Medial Lower Leg Performed By: Clinician [name] Aas, RN Compression Type: Three Layer Pre Treatment ABI: 1.2 Electronic Signature(s) Signed: 07/05/2020 7:58:06 AM By: [name] Entered By: [name] on 07/04/2020 13:35:03 [name] Hilts A. ([id number]) -------------------------------------------------------------------------------- Encounter Discharge Information Details Patient Name: [name] Hilts A. Date of Service: 07/04/2020 1:00 PM Medical Record Number: [id number] Patient Account Number: [id number] Date of  Birth/Sex: 02-Dec-1930 (86 y.o. M) Treating RN: [name] Primary Care Jafeth Mustin: [name] Other Clinician: Referring Mahamud [name]: [name] Treating [name]: [name] Weeks in Treatment: 1 Encounter Discharge Information Items Discharge Condition: Stable Ambulatory Status: Walker Discharge Destination: Home Transportation: Other Accompanied By: daughter Schedule Follow-up Appointment: Yes Clinical Summary of Care: Electronic Signature(s) Signed: 07/05/2020 7:58:06 AM By: [name] Entered By: [name] on 07/04/2020 13:36:13 [name] Hilts A. ([id number]) -------------------------------------------------------------------------------- Wound Assessment Details Patient Name: [name] Hilts A. Date of Service: 07/04/2020 1:00 PM Medical Record Number: [id number] Patient Account Number: [id number] Date of Birth/Sex: January 02, 1931 (86 y.o. M) Treating RN: [name] Primary Care Rajeev Escue: [name] Other Clinician: Referring [name]: [name] Treating [name]: [name] Weeks in Treatment:  1 Wound Status Wound Number: 3 Primary Venous Leg Ulcer Etiology: Wound Location: Left, Medial Lower Leg Secondary Lymphedema Wounding Event: Gradually Appeared Etiology: Date Acquired: 02/24/2020 Wound Open Weeks Of Treatment: 1 Status: Clustered Wound: No Comorbid Cataracts, Sleep Apnea, Congestive Heart Failure, History: Coronary Artery Disease, Hypertension, Osteoarthritis Photos Wound Measurements Length: (cm) 6.2 Width: (cm) 7.3 Depth: (cm) 0.3 Area: (cm) 35.547 Volume: (cm) 10.664 % Reduction in Area: -1558% % Reduction in Volume: -2385.8% Epithelialization: Small (1-33%) Tunneling: No Undermining: No Wound Description Classification: Full Thickness Without Exposed Support Structu Wound Margin: Flat and Intact Exudate Amount: Medium Exudate Type: Serosanguineous Exudate Color: red, brown res Foul  Odor After Cleansing: No Slough/Fibrino Yes Wound Bed Granulation Amount: Medium (34-66%) Exposed Structure Granulation Quality: Red Fascia Exposed: No Necrotic Amount: Medium (34-66%) Fat Layer (Subcutaneous Tissue) Exposed: Yes Necrotic Quality: Adherent Slough Tendon Exposed: No Muscle Exposed: No Joint Exposed: No Bone Exposed: No Treatment Notes Wound #3 (Left, Medial Lower Leg) Notes Scell, xsorb, 3 layer wrap, zinc to periwound area Eisel, Monta A. ([id number]) Electronic Signature(s) Signed: 07/05/2020 7:58:06 AM By: [name] Entered By: [name] on 07/04/2020 13:33:10 [name] Hilts A. ([id number]) -------------------------------------------------------------------------------- Wound Assessment Details Patient Name: [name] Hilts A. Date of Service: 07/04/2020 1:00 PM Medical Record Number: [id number] Patient Account Number: [id number] Date of Birth/Sex: 11/05/1931 (86 y.o. M) Treating RN: [name] Primary Care Deckard Stuber: [name] Other Clinician: Referring Faatimah Spielberg: [name] Treating Kin Galbraith/Extender: [name] Weeks in Treatment: 1 Wound Status Wound Number: 4 Primary Venous Leg Ulcer Etiology: Wound Location: Right, Anterior Lower Leg Secondary Lymphedema Wounding Event: Gradually Appeared Etiology: Date Acquired: 06/05/2020 Wound Open Weeks Of Treatment: 1 Status: Clustered Wound: No Comorbid Cataracts, Sleep Apnea, Congestive Heart Failure, History: Coronary Artery Disease, Hypertension, Osteoarthritis Photos Wound Measurements Length: (cm) 0.9 Width: (cm) 0.6 Depth: (cm) 0.1 Area: (cm) 0.424 Volume: (cm) 0.042 % Reduction in Area: -1267.7% % Reduction in Volume: -1300% Epithelialization: None Tunneling: No Undermining: No Wound Description Classification: Full Thickness Without Exposed Support Structu Wound Margin: Flat and Intact Exudate Amount: Large Exudate Type: Serous Exudate Color: amber res  Foul Odor After Cleansing: No Slough/Fibrino Yes Wound Bed Granulation Amount: Medium (34-66%) Exposed Structure Granulation Quality: Red Fascia Exposed: No Necrotic Amount: Medium (34-66%) Fat Layer (Subcutaneous Tissue) Exposed: Yes Necrotic Quality: Adherent Slough Tendon Exposed: No Muscle Exposed: No Joint Exposed: No Bone Exposed: No Treatment Notes Wound #4 (Right, Anterior Lower Leg) Notes Scell, xsorb, 3 layer wrap, zinc to periwound area Furio, [name] ([id number]) Electronic Signature(s) Signed: 07/05/2020 7:58:06 AM By: [name] Entered By: [name] on 07/04/2020 13:33:48 [name], [name] ([id number]) -------------------------------------------------------------------------------- Wound Assessment Details Patient Name: [name] Hilts A. Date of Service: 07/04/2020 1:00 PM Medical Record Number: [id number] Patient Account Number: [id number] Date of Birth/Sex: Dec 07, 1930 (86 y.o. M) Treating RN: [name] Primary Care Arseniy Toomey: [name] Other Clinician: Referring Damareon Lanni: [name] Treating Gwyneth Fernandez/Extender: [name] Weeks in Treatment: 1 Wound Status Wound Number: 5 Primary Venous Leg Ulcer Etiology: Wound Location: Right, Medial Lower Leg Wound Open Wounding Event: Gradually Appeared Status: Date Acquired: 06/26/2020 Comorbid Cataracts, Sleep Apnea, Congestive Heart Failure, Weeks Of Treatment: 0 History: Coronary Artery Disease, Hypertension, Osteoarthritis Clustered Wound: No Photos Wound Measurements Length: (cm) 0.5 % Re Width: (cm) 0.5 % Re Depth: (cm) 0.1 Epit Area: (cm) 0.196 Tun Volume: (cm) 0.02 Und duction in Area: -317% duction in Volume: -300% helialization: Small (1-33%) neling: No ermining: No Wound Description Classification: Partial Thickness Foul Wound Margin: Flat  and Intact Slou Exudate Amount: Medium Exudate Type: Serous Exudate Color: amber Odor After Cleansing: No gh/Fibrino  No Wound Bed Granulation Amount: Small (1-33%) Exposed Structure Granulation Quality: Red Fascia Exposed: No Necrotic Amount: Small (1-33%) Fat Layer (Subcutaneous Tissue) Exposed: Yes Necrotic Quality: Adherent Slough Tendon Exposed: No Muscle Exposed: No Joint Exposed: No Bone Exposed: No Treatment Notes Wound #5 (Right, Medial Lower Leg) Notes Scell, xsorb, 3 layer wrap, zinc to periwound area Electronic Signature(s) Signed: 07/05/2020 7:58:06 AM By: Sarajane Marek, Torien A. ([id number]) Entered By: [name] on 07/04/2020 13:34:26

## 2020-07-05 NOTE — Telephone Encounter (Signed)
Patient family member calling with weight update 08/09  215 08/11  214

## 2020-07-05 NOTE — Telephone Encounter (Signed)
Weight appears to be trending in the right direction. Plan for Metolazone dose Wednesday as discussed in clinic and lab work Thursday. Will reassess response Friday at clinic visit. Please encourage to be on time for clinic visit Friday in case we need to do IV Lasix again.   TY! Alver Sorrow, NP

## 2020-07-05 NOTE — Progress Notes (Signed)
JAXTON, CASALE ([id number]) Visit Report for 07/04/2020 Physician Orders Details Patient Name: [name], [name] Date of Service: 07/04/2020 1:00 PM Medical Record Number: [id number] Patient Account Number: [id number] Date of Birth/Sex: 11-Oct-1931 (86 y.o. M) Treating RN: Tyler Aas Primary Care Provider: [name] Other Clinician: Referring Provider: [name] Treating Provider/Extender: Linwood Dibbles, Delani Kohli Weeks in Treatment: 1 Verbal / Phone Orders: No Diagnosis Coding Wound Cleansing Wound #3 Left,Medial Lower Leg o Dial antibacterial soap, wash wounds, rinse and pat dry prior to dressing wounds Wound #4 Right,Anterior Lower Leg o Dial antibacterial soap, wash wounds, rinse and pat dry prior to dressing wounds Anesthetic (add to Medication List) Wound #3 Left,Medial Lower Leg o Topical Lidocaine 4% cream applied to wound bed prior to debridement (In Clinic Only). Wound #4 Right,Anterior Lower Leg o Topical Lidocaine 4% cream applied to wound bed prior to debridement (In Clinic Only). Primary Wound Dressing Wound #3 Left,Medial Lower Leg o Silver Alginate Wound #4 Right,Anterior Lower Leg o Silver Alginate Secondary Dressing Wound #3 Left,Medial Lower Leg o XtraSorb Wound #4 Right,Anterior Lower Leg o XtraSorb Dressing Change Frequency Wound #3 Left,Medial Lower Leg o Change dressing every week Wound #4 Right,Anterior Lower Leg o Change dressing every week Follow-up Appointments Wound #3 Left,Medial Lower Leg o Return Appointment in 2 weeks. o Nurse Visit as needed - 1 week Wound #4 Right,Anterior Lower Leg o Return Appointment in 2 weeks. o Nurse Visit as needed - 1 week Edema Control Wound #3 Left,Medial Lower Leg o 3 Layer Compression System - Bilateral - unna to anchor o Elevate legs to the level of the heart and pump ankles as often as possible Wound #4 Right,Anterior Lower Leg o 3 Layer Compression System -  Bilateral - unna to anchor [name], [name] ([id number]) o Elevate legs to the level of the heart and pump ankles as often as possible Electronic Signature(s) Signed: 07/04/2020 4:25:13 PM By: Lenda Kelp PA-C Signed: 07/05/2020 7:58:06 AM By: Tyler Aas Entered By: Tyler Aas on 07/04/2020 16:23:57 [name] Hilts A. ([id number]) -------------------------------------------------------------------------------- SuperBill Details Patient Name: [name] Hilts A. Date of Service: 07/04/2020 Medical Record Number: [id number] Patient Account Number: [id number] Date of Birth/Sex: 1931-10-21 (86 y.o. M) Treating RN: Tyler Aas Primary Care Provider: [name] Other Clinician: Referring Provider: [name] Treating Provider/Extender: Linwood Dibbles, Lilit Cinelli Weeks in Treatment: 1 Diagnosis Coding ICD-10 Codes Code Description I89.0 Lymphedema, not elsewhere classified I87.2 Venous insufficiency (chronic) (peripheral) L97.822 Non-pressure chronic ulcer of other part of left lower leg with fat layer exposed L97.812 Non-pressure chronic ulcer of other part of right lower leg with fat layer exposed Z79.01 Long term (current) use of anticoagulants Z95.0 Presence of cardiac pacemaker I10 Essential (primary) hypertension I50.42 Chronic combined systolic (congestive) and diastolic (congestive) heart failure I25.10 Atherosclerotic heart disease of native coronary artery without angina pectoris Facility Procedures CPT4: Description Modifier Quantity Code [id number] BILATERAL: Application of multi-layer venous compression system; leg (below knee), including 1 ankle and foot. Electronic Signature(s) Signed: 07/04/2020 4:25:13 PM By: Lenda Kelp PA-C Signed: 07/05/2020 7:58:06 AM By: Tyler Aas Entered By: Tyler Aas on 07/04/2020 13:37:03

## 2020-07-06 ENCOUNTER — Telehealth: Payer: Self-pay | Admitting: Family

## 2020-07-06 ENCOUNTER — Other Ambulatory Visit
Admission: RE | Admit: 2020-07-06 | Discharge: 2020-07-06 | Disposition: A | Discharge status: Home / Self Care | Payer: Medicare Other | Attending: Family | Admitting: Family

## 2020-07-06 ENCOUNTER — Telehealth: Payer: Self-pay

## 2020-07-06 ENCOUNTER — Ambulatory Visit: Payer: Medicare Other | Admitting: Physician Assistant

## 2020-07-06 DIAGNOSIS — I5033 Acute on chronic diastolic (congestive) heart failure: Secondary | ICD-10-CM | POA: Insufficient documentation

## 2020-07-06 DIAGNOSIS — Z79899 Other long term (current) drug therapy: Secondary | ICD-10-CM | POA: Insufficient documentation

## 2020-07-06 LAB — BASIC METABOLIC PANEL
Anion gap: 13 (ref 5–15)
BUN: 43 mg/dL — ABNORMAL HIGH (ref 8–23)
CO2: 29 mmol/L (ref 22–32)
Calcium: 8.5 mg/dL — ABNORMAL LOW (ref 8.9–10.3)
Chloride: 91 mmol/L — ABNORMAL LOW (ref 98–111)
Creatinine, Ser: 1.48 mg/dL — ABNORMAL HIGH (ref 0.61–1.24)
GFR calc Af Amer: 49 mL/min — ABNORMAL LOW (ref >60)
GFR calc non Af Amer: 42 mL/min — ABNORMAL LOW (ref >60)
Glucose, Bld: 102 mg/dL — ABNORMAL HIGH (ref 70–99)
Potassium: 3 mmol/L — ABNORMAL LOW (ref 3.5–5.1)
Sodium: 133 mmol/L — ABNORMAL LOW (ref 135–145)

## 2020-07-06 NOTE — Telephone Encounter (Signed)
I spoke with the patient's daughter, Cala Bradford. She is currently with the patient. I have advised her of the patient's lab results.  I have also advised her of Luther Parody, NP's recommendations to: 1) take an extra potassium 10 meq- 4 tablets (40 meq) this afternoon 2) continue torsemide 40 mg BID 3) continue potassium 30 meq QD 4) hold metolazone (says until seen in clinic on Friday, but the patient does not have a Friday appointment)  The patient is scheduled to be seen back in clinic on Thursday 07/13/20. I advised Cala Bradford I would clarify with Luther Parody, if the patient is to hold metolazone until seen by [facility] on 8/19, or if we need to do something different.  Cala Bradford voices understanding of the above recommendations and is aware I will call back to clarify regarding metolazone / follow up.

## 2020-07-06 NOTE — Telephone Encounter (Signed)
DPR on file. Spoke with [name] the patients daughter. She has already spoke with Herbert Seta, RN regarding the patients lab results.   Closing this is a duplicate encounter.

## 2020-07-06 NOTE — Telephone Encounter (Signed)
So sorry! Misread the calendar!  Updated recommendations.Marland KitchenMarland Kitchen  1) Extra of potassium this afternoon 2) Continue Torsemide 40mg  BID 3) Continue Potassium QD 4) Take Metolazone 2.5mg  on Saturday and Tuesday. He should take 30 minutes prior to his morning Torsemide. On those days he should take an additional Wednesday (3 tabs) of potassium in the afternoon. Of note, if he is down to his dry weight he could hold off on the Metolazone.   We will plan to repeat lab work at his appointment 07/13/20.   Keep up the good work with daily weights! I believe his dry weight is around 208 lbs and on last report he was 210 lbs.   07/15/20, NP

## 2020-07-06 NOTE — Telephone Encounter (Signed)
Patient daughter calling to check status of lab results

## 2020-07-06 NOTE — Telephone Encounter (Signed)
-----   Message from Alver Sorrow, NP sent at 07/06/2020 11:58 AM EDT ----- Kidney function stable. Sodium stable. Potassium decreased to 3.0 (goal of 3.5 - 5.0). Please take an extra of potassium this afternoon.   Continue Torsemide 40mg  BID. Continue Potassium daily. Hold Metolazone until seen in clinic Friday (that way we can determine whether repeat IV Lasix is needed).

## 2020-07-06 NOTE — Telephone Encounter (Signed)
-----   Message from [name] S Walker, NP sent at 07/06/2020 11:58 AM EDT ----- Kidney function stable. Sodium stable. Potassium decreased to 3.0 (goal of 3.5 - 5.0). Please take an extra 40mEq of potassium this afternoon.   Continue Torsemide 40mg BID. Continue Potassium 30mEq daily. Hold Metolazone until seen in clinic Friday (that way we can determine whether repeat IV Lasix is needed). 

## 2020-07-06 NOTE — Telephone Encounter (Signed)
I spoke with [name] and advised her of [name]'s updated recommendations regarding metolazone & potassium.  [name] voices understanding and is agreeable.  She has been advised to have the patient keep a track of his daily weights at home. We will follow up on 07/13/20 with the patient as scheduled.

## 2020-07-06 NOTE — Telephone Encounter (Signed)
See 07/06/20 phone note.

## 2020-07-06 NOTE — Telephone Encounter (Signed)
[name] Sorrow, NP  07/06/2020 11:58 AM EDT     Kidney function stable. Sodium stable. Potassium decreased to 3.0 (goal of 3.5 - 5.0). Please take an extra of potassium this afternoon.   Continue Torsemide 40mg  BID. Continue Potassium daily. Hold Metolazone until seen in clinic Friday (that way we can determine whether repeat IV Lasix is needed).

## 2020-07-07 ENCOUNTER — Other Ambulatory Visit: Payer: Self-pay

## 2020-07-07 ENCOUNTER — Telehealth: Payer: Self-pay | Admitting: Cardiovascular Disease

## 2020-07-07 ENCOUNTER — Encounter: Payer: Medicare Other | Admitting: Physician Assistant

## 2020-07-07 NOTE — Telephone Encounter (Signed)
Dr. Allen Derry, Wound care center at [facility], calling in on behalf of patient reporting 5-7 new blister areas overloaded with fluid. States they do not look infected but a lot more fluid build up in one week. Patient went to ED last night because his leg wraps were bothering him. Dr. Larina Bras states he is unsure how to proceed but definitely thinking patient should be seen sooner than originally   Dr. Stones number call back number is [phone]

## 2020-07-07 NOTE — Telephone Encounter (Signed)
  1) Continue Torsemide 40mg  BID 2) Continue Potassium QD 3) Take Metolazone 2.5mg  on Saturday and Tuesday as previous instructed. He should take 30 minutes prior to his morning Torsemide. On those days he should take an additional Monday (3 tabs) of potassium in the afternoon. Previously he was told if he was at his dry weight he could hold, but as wound care reports worsening blisters recommend he take both doses. (4) Recommend call Wednesday with report of weight. (5) Elevate lower extremities as much as possible  We can move his appt up if able. Recommend 2pm appt or earlier to be able to facilitate IV Lasix if needed.   Friday, NP

## 2020-07-07 NOTE — Telephone Encounter (Signed)
On further review his office visit has been rescheduled till Monday.  Agree with earlier appointment.  He should take torsemide 40 mg twice daily until seen in clinic along with potassium 30 mEq daily.  On Saturday he should take metolazone 2.5 mg 30 minutes prior to his morning torsemide dose.  On Saturday he should take an additional 30 mEq of potassium in the afternoon.  Alver Sorrow, NP

## 2020-07-07 NOTE — Progress Notes (Addendum)
[name], [name] ([id number]) Visit Report for 07/07/2020 Chief Complaint Document Details Patient Name: [name], [name] Date of Service: 07/07/2020 3:00 PM Medical Record Number: [id number] Patient Account Number: [id number] Date of Birth/Sex: 1931/03/24 (86 y.o. M) Treating RN: Huel CoventryWoody, [name] Primary Care Provider: Marcelino DusterJOHNSTON, JOHN Other Clinician: Referring Provider: Marcelino DusterJOHNSTON, JOHN Treating Provider/Extender: Linwood DibblesSTONE III, Yecenia Dalgleish Weeks in Treatment: 2 Information Obtained from: Patient Chief Complaint 04/21/2019; patient is here for review of wounds x2 on his right lower extremity. Electronic Signature(s) Signed: 07/07/2020 3:33:25 PM By: Lenda KelpStone III, Mylin Hirano PA-C Entered By: Lenda KelpStone III, Irmgard Rampersaud on 07/07/2020 15:33:25 [name], [name] AMarland Kitchen. ([id number]) -------------------------------------------------------------------------------- HPI Details Patient Name: [name], [name] Date of Service: 07/07/2020 3:00 PM Medical Record Number: [id number] Patient Account Number: [id number] Date of Birth/Sex: 1931/03/24 (86 y.o. M) Treating RN: Huel CoventryWoody, [name] Primary Care Provider: Marcelino DusterJOHNSTON, JOHN Other Clinician: Referring Provider: Marcelino DusterJOHNSTON, JOHN Treating Provider/Extender: Linwood DibblesSTONE III, Frieda Arnall Weeks in Treatment: 2 History of Present Illness HPI Description: 04/21/2019 ADMISSION This is an independent 86 year old man who lives in the independent part of [address]. He has a history of chronic lower extremity edema and wears compression stockings. He states about a month ago he was taking off the one on the right and pulled some skin off accidentally. He has had 2 small wounds on the right anterior and right medial lower extremity. I [facility] that he had multiple venous ultrasounds in 2015 and 16. These were DVT rule outs. He did have a Baker's cyst on the left. He has not not had a previous wound history although he did have a history of cellulitis in his legs. Past medical history;  includes aortic stenosis status post mechanical AVR in 2007 on chronic Coumadin, lower extremity edema with a history of cellulitis, history of MRSA. ABIs in our clinic were 1.1 on the right 6/3; patient with predominantly venous insufficiency ulcers in the right lower leg probably some degree of lymphedema. He had to wounds last week. We put him in 3 layer compression. [facility] [place] [facility]. The area laterally is healed but he still has a small very painful area on the anterior tibial area. He is on Coumadin because of mechanical aortic valve. 6/10; venous insufficiency ulcers on the right lower leg. He has some degree of lymphedema. We have been using 3 layer compression with silver collagen small wound. Dimensions are improved. I gave him doxycycline last week which he tolerated because of surrounding erythema. This is improved also 6/17; arrives in clinic today with portable silver collagen dressing tightly adherent to the wound. Also felt that the wrap was too tight [facility]; patient's wound is small still adherent debris over the surface however even under illumination it was hard to see what was still open here. We have been using silver collagen 7/1; the patient arrives in the clinic today and the area on the right lower leg is healed. He has severe chronic venous insufficiency. He has 20/30 compression stockings. Readmission: 06/22/2020 on evaluation today patient presents for reevaluation here in our clinic although it has been a little over a year since we last saw him. He does have a history of lymphedema, venous insufficiency, anticoagulant therapy, he is on a pacemaker which is the reason for the anticoagulants, hypertension, and congestive heart failure. With that being said unfortunately he has been dealing with a wound of the left and right legs which has  been giving him some trouble since arrival  first 2021. That is in regard to the left leg ulcer. In regard to the right leg ulcer this is just a very small area that really I think will seal up quite nicely is more of the lymphedema opening than anything else. With that being said unfortunately the left leg is quite significant. We actually have noted that the patient's been dealing with this for quite some time and I think that there is a chance we may want to consider biopsy. With that being said he is on Coumadin therefore we were not able to perform a biopsy at this point. I think that something however in the future that may be warranted we may just have to take him off of the current Coumadin regimen in order to do this. We discussed doing it today but I am more concerned about the fact that he could have issues with uncontrolled bleeding and in that case we may have to send him to the ER. His daughter was not wanting to take that chance which I can completely understand. Nonetheless so far they have been using antibiotic ointment over the area I am just putting a protective dressing I do believe the patient is that he needs some type of compression. 07/07/2020 on evaluation today patient appears to be doing poorly in regard to his bilateral lower extremities today. He actually went to urgent care last night due to having pain in his legs due to the wraps. It appears they got somewhat tight on him because of the fact that he is having such significant swelling. He appears to be fluid overloaded I do not see any signs of infection actively at this point which is good news but unfortunately I think that this is something that may need to be addressed even by cardiology more so than just with a different or alternate type of wrap. I will contact his cardiologist, Dr. Mariah Milling, as well. Electronic Signature(s) Signed: 07/07/2020 4:06:07 PM By: Lenda Kelp PA-C Entered By: Lenda Kelp on 07/07/2020 16:06:06 [name] Quails  ([id number]) -------------------------------------------------------------------------------- Physical Exam Details Patient Name: [name] Hilts A. Date of Service: 07/07/2020 3:00 PM Medical Record Number: [id number] Patient Account Number: [id number] Date of Birth/Sex: 1931/10/09 (86 y.o. M) Treating RN: Huel Coventry Primary Care Provider: [name] Other Clinician: Referring Provider: [name] Treating Provider/Extender: Linwood Dibbles, Eufemio Strahm Weeks in Treatment: 2 Constitutional Well-nourished and well-hydrated in no acute distress. Respiratory normal breathing without difficulty. Psychiatric this patient is able to make decisions and demonstrates good insight into disease process. Alert and Oriented x 3. pleasant and cooperative. Notes Upon inspection patient has multiple blisters which are new over the bilateral lower extremities and he appears to be fairly severely fluid overloaded in my opinion unfortunately. There does not appear to be any signs of infection such as erythema or purulent drainage which is good news but nonetheless this does have me concerned. Actually did contact Dr. Windell Hummingbird office is cardiologist. I did speak with the receptionist who took down the information and is get in touch with his nurse in order for them to get in touch with the provider as well. Depending on what he says they told me that they would reach out to him as far as scheduling him possibly for a sooner appointment than what was already scheduled. He was planning to already be seeing them next Thursday for an appointment. Electronic Signature(s) Signed: 07/07/2020 4:08:04 PM By: Lenda Kelp PA-C  Entered By: Lenda Kelp on 07/07/2020 16:08:04 [name] Quails ([id number]) -------------------------------------------------------------------------------- Physician Orders Details Patient Name: [name] Hilts A. Date of Service: 07/07/2020 3:00 PM Medical Record Number: [id number] Patient  Account Number: [id number] Date of Birth/Sex: 04-10-1931 (86 y.o. M) Treating RN: Huel Coventry Primary Care Provider: [name] Other Clinician: Referring Provider: [name] Treating Provider/Extender: Linwood Dibbles, Arloa Prak Weeks in Treatment: 2 Verbal / Phone Orders: No Diagnosis Coding ICD-10 Coding Code Description I89.0 Lymphedema, not elsewhere classified I87.2 Venous insufficiency (chronic) (peripheral) L97.822 Non-pressure chronic ulcer of other part of left lower leg with fat layer exposed L97.812 Non-pressure chronic ulcer of other part of right lower leg with fat layer exposed Z79.01 Long term (current) use of anticoagulants Z95.0 Presence of cardiac pacemaker I10 Essential (primary) hypertension I50.42 Chronic combined systolic (congestive) and diastolic (congestive) heart failure I25.10 Atherosclerotic heart disease of native coronary artery without angina pectoris Wound Cleansing Wound #3 Left,Medial Lower Leg o Dial antibacterial soap, wash wounds, rinse and pat dry prior to dressing wounds Wound #4 Right,Anterior Lower Leg o Dial antibacterial soap, wash wounds, rinse and pat dry prior to dressing wounds Anesthetic (add to Medication List) Wound #3 Left,Medial Lower Leg o Topical Lidocaine 4% cream applied to wound bed prior to debridement (In Clinic Only). Wound #4 Right,Anterior Lower Leg o Topical Lidocaine 4% cream applied to wound bed prior to debridement (In Clinic Only). Primary Wound Dressing Wound #3 Left,Medial Lower Leg o Silver Alginate Wound #4 Right,Anterior Lower Leg o Silver Alginate Secondary Dressing Wound #3 Left,Medial Lower Leg o XtraSorb o Conform/Kerlix - Secure with tape Wound #4 Right,Anterior Lower Leg o XtraSorb o Conform/Kerlix - Secure with tape Wound #5 Right,Medial Lower Leg o Conform/Kerlix - Secure with tape Dressing Change Frequency Wound #3 Left,Medial Lower Leg o Change dressing every  week Wound #4 Right,Anterior Lower Leg o Change dressing every week [name], [name] ([id number]) Follow-up Appointments Wound #3 Left,Medial Lower Leg o Return Appointment in 1 week. o Nurse Visit as needed Wound #4 Right,Anterior Lower Leg o Return Appointment in 1 week. o Nurse Visit as needed Edema Control Wound #3 Left,Medial Lower Leg o Other: - Tubi G Wound #4 Right,Anterior Lower Leg o Other: - Tubi G Wound #5 Right,Medial Lower Leg o Other: - Tubi G Electronic Signature(s) Signed: 07/07/2020 5:00:44 PM By: Lenda Kelp PA-C Signed: 07/07/2020 6:14:49 PM By: Elliot Gurney, BSN, RN, CWS, [name] RN, BSN Entered By: Elliot Gurney, BSN, RN, CWS, [name] on 07/07/2020 15:52:33 [name] Hilts A. ([id number]) -------------------------------------------------------------------------------- Problem List Details Patient Name: [name] Hilts A. Date of Service: 07/07/2020 3:00 PM Medical Record Number: [id number] Patient Account Number: [id number] Date of Birth/Sex: [birth year] (86 y.o. M) Treating RN: Huel Coventry Primary Care Provider: [name] Other Clinician: Referring Provider: [name] Treating Provider/Extender: Linwood Dibbles, Raksha Wolfgang Weeks in Treatment: 2 Active Problems ICD-10 Encounter Code Description Active Date MDM Diagnosis I89.0 Lymphedema, not elsewhere classified 06/22/2020 No Yes I87.2 Venous insufficiency (chronic) (peripheral) 06/22/2020 No Yes L97.822 Non-pressure chronic ulcer of other part of left lower leg with fat layer 06/22/2020 No Yes exposed L97.812 Non-pressure chronic ulcer of other part of right lower leg with fat layer 06/22/2020 No Yes exposed Z79.01 Long term (current) use of anticoagulants 06/22/2020 No Yes Z95.0 Presence of cardiac pacemaker 06/22/2020 No Yes I10 Essential (primary) hypertension 06/22/2020 No Yes I50.42 Chronic combined systolic (congestive) and diastolic (congestive) heart 06/22/2020 No Yes failure I25.10 Atherosclerotic heart  disease of native coronary artery without angina 06/22/2020 No Yes pectoris Inactive Problems  Resolved Problems Electronic Signature(s) Signed: 07/07/2020 3:31:31 PM By: Lenda Kelp PA-C Entered By: Lenda Kelp on 07/07/2020 15:31:30 [name] Hilts A. ([id number]) -------------------------------------------------------------------------------- Progress Note Details Patient Name: [name] Hilts A. Date of Service: 07/07/2020 3:00 PM Medical Record Number: [id number] Patient Account Number: [id number] Date of Birth/Sex: November 22, 1931 (86 y.o. M) Treating RN: Huel Coventry Primary Care Provider: [name] Other Clinician: Referring Provider: [name] Treating Provider/Extender: Linwood Dibbles, Xena Propst Weeks in Treatment: 2 Subjective Chief Complaint Information obtained from Patient 04/21/2019; patient is here for review of wounds x2 on his right lower extremity. History of Present Illness (HPI) 04/21/2019 ADMISSION This is an independent 86 year old man who lives in the independent part of [address] [place]. He has a history of chronic lower extremity edema and wears compression stockings. He states about a month ago he was taking off the one on the right and pulled some skin off accidentally. He has had 2 small wounds on the right anterior and right medial lower extremity. I note looking through [facility] health link that he had multiple venous ultrasounds in 2015 and 16. These were DVT rule outs. He did have a Baker's cyst on the left. He has not not had a previous wound history although he did have a history of cellulitis in his legs. Past medical history; includes aortic stenosis status post mechanical AVR in 2007 on chronic Coumadin, lower extremity edema with a history of cellulitis, history of MRSA. ABIs in our clinic were 1.1 on the right 6/3; patient with predominantly venous insufficiency ulcers in the right lower leg probably some degree of lymphedema. He had to wounds  last week. We put him in 3 layer compression. The nurses at [facility] are changing the dressing. The area laterally is healed but he still has a small very painful area on the anterior tibial area. He is on Coumadin because of mechanical aortic valve. 6/10; venous insufficiency ulcers on the right lower leg. He has some degree of lymphedema. We have been using 3 layer compression with silver collagen small wound. Dimensions are improved. I gave him doxycycline last week which he tolerated because of surrounding erythema. This is improved also 6/17; arrives in clinic today with portable silver collagen dressing tightly adherent to the wound. Also felt that the wrap was too tight 3 layer being changed at the [facility] 6/24; patient's wound is small still adherent debris over the surface however even under illumination it was hard to see what was still open here. We have been using silver collagen 7/1; the patient arrives in the clinic today and the area on the right lower leg is healed. He has severe chronic venous insufficiency. He has 20/30 compression stockings. Readmission: 06/22/2020 on evaluation today patient presents for reevaluation here in our clinic although it has been a little over a year since we last saw him. He does have a history of lymphedema, venous insufficiency, anticoagulant therapy, he is on a pacemaker which is the reason for the anticoagulants, hypertension, and congestive heart failure. With that being said unfortunately he has been dealing with a wound of the left and right legs which has been giving him some trouble since arrival first 2021. That is in regard to the left leg ulcer. In regard to the right leg ulcer this is just a very small area that really I think will seal up quite nicely is more of the lymphedema opening than anything else. With that being said unfortunately the left leg is  quite significant. We actually have noted that the patient's  been dealing with this for quite some time and I think that there is a chance we may want to consider biopsy. With that being said he is on Coumadin therefore we were not able to perform a biopsy at this point. I think that something however in the future that may be warranted we may just have to take him off of the current Coumadin regimen in order to do this. We discussed doing it today but I am more concerned about the fact that he could have issues with uncontrolled bleeding and in that case we may have to send him to the ER. His daughter was not wanting to take that chance which I can completely understand. Nonetheless so far they have been using antibiotic ointment over the area I am just putting a protective dressing I do believe the patient is that he needs some type of compression. 07/07/2020 on evaluation today patient appears to be doing poorly in regard to his bilateral lower extremities today. He actually went to urgent care last night due to having pain in his legs due to the wraps. It appears they got somewhat tight on him because of the fact that he is having such significant swelling. He appears to be fluid overloaded I do not see any signs of infection actively at this point which is good news but unfortunately I think that this is something that may need to be addressed even by cardiology more so than just with a different or alternate type of wrap. I will contact his cardiologist, Dr. Mariah Milling, as well. Objective Constitutional [name], [name] ([id number]) Well-nourished and well-hydrated in no acute distress. Vitals Time Taken: 3:06 PM, Height: 71 in, Weight: 220 lbs, BMI: 30.7, Temperature: 97.6 F, Pulse: 69 bpm, Respiratory Rate: 18 breaths/min, Blood Pressure: 141/66 mmHg. Respiratory normal breathing without difficulty. Psychiatric this patient is able to make decisions and demonstrates good insight into disease process. Alert and Oriented x 3. pleasant and  cooperative. General Notes: Upon inspection patient has multiple blisters which are new over the bilateral lower extremities and he appears to be fairly severely fluid overloaded in my opinion unfortunately. There does not appear to be any signs of infection such as erythema or purulent drainage which is good news but nonetheless this does have me concerned. Actually did contact Dr. Windell Hummingbird office is cardiologist. I did speak with the receptionist who took down the information and is get in touch with his nurse in order for them to get in touch with the provider as well. Depending on what he says they told me that they would reach out to him as far as scheduling him possibly for a sooner appointment than what was already scheduled. He was planning to already be seeing them next Thursday for an appointment. Integumentary (Hair, Skin) Wound #3 status is Open. Original cause of wound was Gradually Appeared. The wound is located on the Left,Medial Lower Leg. The wound measures 6.3cm length x 8.5cm width x 0.3cm depth; 42.058cm^2 area and 12.617cm^3 volume. There is Fat Layer (Subcutaneous Tissue) Exposed exposed. There is a medium amount of serosanguineous drainage noted. The wound margin is flat and intact. There is medium (34-66%) red granulation within the wound bed. There is a medium (34-66%) amount of necrotic tissue within the wound bed including Adherent Slough. Wound #4 status is Open. Original cause of wound was Gradually Appeared. The wound is located on the Right,Anterior Lower Leg. The wound measures  1.1cm length x 0.7cm width x 0.1cm depth; 0.605cm^2 area and 0.06cm^3 volume. There is Fat Layer (Subcutaneous Tissue) Exposed exposed. There is a large amount of serous drainage noted. The wound margin is flat and intact. There is medium (34-66%) red granulation within the wound bed. There is a medium (34-66%) amount of necrotic tissue within the wound bed including Adherent Slough. Wound #5  status is Open. Original cause of wound was Gradually Appeared. The wound is located on the Right,Medial Lower Leg. The wound measures 0.4cm length x 0.4cm width x 0.1cm depth; 0.126cm^2 area and 0.013cm^3 volume. There is Fat Layer (Subcutaneous Tissue) Exposed exposed. There is a medium amount of serous drainage noted. The wound margin is flat and intact. There is small (1-33%) red granulation within the wound bed. There is a small (1-33%) amount of necrotic tissue within the wound bed including Adherent Slough. Assessment Active Problems ICD-10 Lymphedema, not elsewhere classified Venous insufficiency (chronic) (peripheral) Non-pressure chronic ulcer of other part of left lower leg with fat layer exposed Non-pressure chronic ulcer of other part of right lower leg with fat layer exposed Long term (current) use of anticoagulants Presence of cardiac pacemaker Essential (primary) hypertension Chronic combined systolic (congestive) and diastolic (congestive) heart failure Atherosclerotic heart disease of native coronary artery without angina pectoris Plan Wound Cleansing: Wound #3 Left,Medial Lower Leg: Dial antibacterial soap, wash wounds, rinse and pat dry prior to dressing wounds Wound #4 Right,Anterior Lower Leg: Dial antibacterial soap, wash wounds, rinse and pat dry prior to dressing wounds Anesthetic (add to Medication List): Wound #3 Left,Medial Lower Leg: Topical Lidocaine 4% cream applied to wound bed prior to debridement (In Clinic Only). Wound #4 Right,Anterior Lower Leg: Topical Lidocaine 4% cream applied to wound bed prior to debridement (In Clinic Only). Primary Wound Dressing: Wound #3 Left,Medial Lower Leg: Silver Alginate Wound #4 Right,Anterior Lower Leg: Silver Alginate Secondary Dressing: [name], [name] ([id number]) Wound #3 Left,Medial Lower Leg: XtraSorb Conform/Kerlix - Secure with tape Wound #4 Right,Anterior Lower Leg: XtraSorb Conform/Kerlix - Secure  with tape Wound #5 Right,Medial Lower Leg: Conform/Kerlix - Secure with tape Dressing Change Frequency: Wound #3 Left,Medial Lower Leg: Change dressing every week Wound #4 Right,Anterior Lower Leg: Change dressing every week Follow-up Appointments: Wound #3 Left,Medial Lower Leg: Return Appointment in 1 week. Nurse Visit as needed Wound #4 Right,Anterior Lower Leg: Return Appointment in 1 week. Nurse Visit as needed Edema Control: Wound #3 Left,Medial Lower Leg: Other: - Tubi G Wound #4 Right,Anterior Lower Leg: Other: - Tubi G Wound #5 Right,Medial Lower Leg: Other: - Tubi G 1. I would recommend currently that we may want to switch to a [place]na boot wrap which could be beneficial for the patient. With that being said for the weekend especially since his daughter is out of town I really think the ideal thing would be for [place] to hold off on that and just use Tubigrip for the time being. 2. We will continue with the silver alginate dressing. 3. I am also can recommend continuation of the [place] as before. We will see patient back for reevaluation in 1 week here in the clinic. If anything worsens or changes patient will contact our office for additional recommendations. I did discuss this with the cardiology office and also alerted the patient's daughter [name], that he was having a significant fluid overload situation and of what we were doing for the time being. Electronic Signature(s) Signed: 07/07/2020 4:09:06 PM By: Lenda Kelp PA-C Previous Signature: 07/07/2020 4:08:36 PM  Version By: Lenda Kelp PA-C Entered By: Lenda Kelp on 07/07/2020 16:09:05 [name] Hilts A. ([id number]) -------------------------------------------------------------------------------- SuperBill Details Patient Name: [name] Hilts A. Date of Service: 07/07/2020 Medical Record Number: [id number] Patient Account Number: [id number] Date of Birth/Sex: Aug 05, 1931 (86 y.o. M) Treating RN: Huel Coventry Primary Care Provider: [name] Other Clinician: Referring Provider: [name] Treating Provider/Extender: Linwood Dibbles, Isabellarose Kope Weeks in Treatment: 2 Diagnosis Coding ICD-10 Codes Code Description I89.0 Lymphedema, not elsewhere classified I87.2 Venous insufficiency (chronic) (peripheral) L97.822 Non-pressure chronic ulcer of other part of left lower leg with fat layer exposed L97.812 Non-pressure chronic ulcer of other part of right lower leg with fat layer exposed Z79.01 Long term (current) use of anticoagulants Z95.0 Presence of cardiac pacemaker I10 Essential (primary) hypertension I50.42 Chronic combined systolic (congestive) and diastolic (congestive) heart failure I25.10 Atherosclerotic heart disease of native coronary artery without angina pectoris Physician Procedures CPT4 Code: 7829562 Description: 99214 - WC PHYS LEVEL 4 - EST PT Modifier: Quantity: 1 CPT4 Code: Description: ICD-10 Diagnosis Description I89.0 Lymphedema, not elsewhere classified I87.2 Venous insufficiency (chronic) (peripheral) 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

## 2020-07-07 NOTE — Telephone Encounter (Signed)
Reviewed plan with Dr. Larina Bras and that patient would be seen on Monday here in our office. He verbalized understanding with no further questions at this time.

## 2020-07-07 NOTE — Progress Notes (Signed)
[name] ([id number]) Visit Report for 07/07/2020 Arrival Information Details Patient Name: [name], [name] Date of Service: 07/07/2020 3:00 PM Medical Record Number: [id number] Patient Account Number: [id number] Date of Birth/Sex: 09/23/1931 (86 y.o. M) Treating RN: Tyler Aas Primary Care Jaloni Davoli: [name] Other Clinician: Referring Verlee Pope: [name] Treating Jahmel Flannagan/Extender: Linwood Dibbles, HOYT Weeks in Treatment: 2 Visit Information History Since Last Visit Added or deleted any medications: No Patient Arrived: Dan Humphreys Had a fall or experienced change in No Arrival Time: 15:05 activities of daily living that may affect Accompanied By: self risk of falls: Transfer Assistance: None Hospitalized since last visit: No Patient Has Alerts: Yes Pain Present Now: Yes Patient Alerts: Patient on Blood Thinner Warfarin Electronic Signature(s) Signed: 07/07/2020 4:49:15 PM By: Tyler Aas Entered By: Tyler Aas on 07/07/2020 15:06:02 [name] Hilts A. ([id number]) -------------------------------------------------------------------------------- Clinic Level of Care Assessment Details Patient Name: [name] Hilts A. Date of Service: 07/07/2020 3:00 PM Medical Record Number: [id number] Patient Account Number: [id number] Date of Birth/Sex: Jan 18, 1931 (86 y.o. M) Treating RN: Huel Coventry Primary Care Eliza Green: [name] Other Clinician: Referring Johnni Wunschel: [name] Treating Ameira Alessandrini/Extender: Linwood Dibbles, HOYT Weeks in Treatment: 2 Clinic Level of Care Assessment Items TOOL 4 Quantity Score []  - Use when only an EandM is performed on FOLLOW-UP visit 0 ASSESSMENTS - Nursing Assessment / Reassessment X - Reassessment of Co-morbidities (includes updates in patient status) 1 10 X- 1 5 Reassessment of Adherence to Treatment Plan ASSESSMENTS - Wound and Skin Assessment / Reassessment []  - Simple Wound Assessment / Reassessment - one wound 0 X- 2  5 Complex Wound Assessment / Reassessment - multiple wounds []  - 0 Dermatologic / Skin Assessment (not related to wound area) ASSESSMENTS - Focused Assessment X - Circumferential Edema Measurements - multi extremities 1 5 []  - 0 Nutritional Assessment / Counseling / Intervention []  - 0 Lower Extremity Assessment (monofilament, tuning fork, pulses) []  - 0 Peripheral Arterial Disease Assessment (using hand held doppler) ASSESSMENTS - Ostomy and/or Continence Assessment and Care []  - Incontinence Assessment and Management 0 []  - 0 Ostomy Care Assessment and Management (repouching, etc.) PROCESS - Coordination of Care X - Simple Patient / Family Education for ongoing care 1 15 []  - 0 Complex (extensive) Patient / Family Education for ongoing care X- 1 10 Staff obtains , Records, Test Results / Process Orders []  - 0 Staff telephones HHA, Nursing Homes / Clarify orders / etc []  - 0 Routine Transfer to another Facility (non-emergent condition) []  - 0 Routine Hospital Admission (non-emergent condition) []  - 0 New Admissions / / Ordering NPWT, Apligraf, etc. []  - 0 Emergency Hospital Admission (emergent condition) X- 1 10 Simple Discharge Coordination []  - 0 Complex (extensive) Discharge Coordination PROCESS - Special Needs []  - Pediatric / Minor Patient Management 0 []  - 0 Isolation Patient Management []  - 0 Hearing / Language / Visual special needs []  - 0 Assessment of Community assistance (transportation, D/C planning, etc.) []  - 0 Additional assistance / Altered mentation []  - 0 Support Surface(s) Assessment (bed, cushion, seat, etc.) INTERVENTIONS - Wound Cleansing / Measurement Kroft, Anup A. ( ) []  - 0 Simple Wound Cleansing - one wound X- 2 5 Complex Wound Cleansing - multiple wounds X- 1 5 Wound Imaging (photographs - any number of wounds) []  - 0 Wound Tracing (instead of photographs) []  - 0 Simple Wound  Measurement - one wound X- 2 5 Complex Wound Measurement - multiple wounds INTERVENTIONS - Wound Dressings []  - Small Wound Dressing  one or multiple wounds 0 []  - 0 Medium Wound Dressing one or multiple wounds X- 2 20 Large Wound Dressing one or multiple wounds []  - 0 Application of Medications - topical []  - 0 Application of Medications - injection INTERVENTIONS - Miscellaneous []  - External ear exam 0 []  - 0 Specimen Collection (cultures, biopsies, blood, body fluids, etc.) []  - 0 Specimen(s) / Culture(s) sent or taken to Lab for analysis []  - 0 Patient Transfer (multiple staff / / Similar devices) []  - 0 Simple Staple / Suture removal (25 or less) []  - 0 Complex Staple / Suture removal (26 or more) []  - 0 Hypo / Hyperglycemic Management (close monitor of Blood Glucose) []  - 0 Ankle / Brachial Index (ABI) - do not check if billed separately X- 1 5 Vital Signs Has the patient been seen at the hospital within the last three years: Yes Total Score: 135 Level Of Care: New/Established - Level 4 Electronic Signature(s) Signed: 07/07/2020 6:14:49 PM By: , BSN, RN, CWS, [name] RN, BSN Entered By: , BSN, RN, CWS, [name] on 07/07/2020 17:39:12 ( ) -------------------------------------------------------------------------------- Encounter Discharge Information Details Patient Name: [name], [name] Date of Service: 07/07/2020 3:00 PM Medical Record Number: Patient Account Number: Date of Birth/Sex: October 13, 1931 (86 y.o. M) Treating RN: Elliot Gurney Primary Care Bernardo Brayman: Elliot Gurney Other Clinician: Referring Addalie Calles: 07/09/2020 Treating Alyssamarie Mounsey/Extender: Joellyn Quails, HOYT Weeks in Treatment: 2 Encounter Discharge Information Items Discharge Condition: Stable Ambulatory Status: Walker Discharge Destination: Home Transportation: Private Auto Accompanied By: self Schedule Follow-up Appointment: Yes Clinical  Summary of Care: Electronic Signature(s) Signed: 07/07/2020 6:14:49 PM By: [name] Hilts, BSN, RN, CWS, [name] RN, BSN Entered By: 07/09/2020, BSN, RN, CWS, [name] on 07/07/2020 15:54:56 [id number] A. (09/14/1931) -------------------------------------------------------------------------------- Lower Extremity Assessment Details Patient Name: 03-22-1992 A. Date of Service: 07/07/2020 3:00 PM Medical Record Number: [name] Patient Account Number: [name] Date of Birth/Sex: 1931/03/04 (86 y.o. M) Treating RN: Elliot Gurney Primary Care Shandiin Eisenbeis: Elliot Gurney Other Clinician: Referring Buell Parcel: 07/09/2020 Treating Uno Esau/Extender: [name] Hilts, HOYT Weeks in Treatment: 2 Edema Assessment Assessed: [Left: No] [Right: No] Edema: [Left: Yes] [Right: Yes] Calf Left: Right: Point of Measurement: 36 cm From Medial Instep 43 cm 45 cm Ankle Left: Right: Point of Measurement: 12 cm From Medial Instep 23 cm 25 cm Vascular Assessment Pulses: Dorsalis Pedis Palpable: [Left:Yes] [Right:Yes] Posterior Tibial Palpable: [Left:Yes] [Right:Yes] Electronic Signature(s) Signed: 07/07/2020 4:49:15 PM By: [name] Hilts Entered By: 07/09/2020 on 07/07/2020 15:31:29 [name], [name] ([id number]) -------------------------------------------------------------------------------- Multi Wound Chart Details Patient Name: 09/14/1931 A. Date of Service: 07/07/2020 3:00 PM Medical Record Number: Tyler Aas Patient Account Number: [name] Date of Birth/Sex: 1931-07-26 (86 y.o. M) Treating RN: 07/09/2020 Primary Care Sophira Rumler: Tyler Aas Other Clinician: Referring Guss Farruggia: Tyler Aas Treating Sharay Bellissimo/Extender: 07/09/2020, HOYT Weeks in Treatment: 2 Vital Signs Height(in): 71 Pulse(bpm): 69 Weight(lbs): 220 Blood Pressure(mmHg): 141/66 Body Mass Index(BMI): 31 Temperature(F): 97.6 Respiratory Rate(breaths/min): 18 Photos: Wound Location: Left, Medial Lower Leg Right, Anterior  Lower Leg Right, Medial Lower Leg Wounding Event: Gradually Appeared Gradually Appeared Gradually Appeared Primary Etiology: Venous Leg Ulcer Venous Leg Ulcer Venous Leg Ulcer Secondary Etiology: Lymphedema Lymphedema N/A Comorbid History: Cataracts, Sleep Apnea, Congestive Cataracts, Sleep Apnea, Congestive Cataracts, Sleep Apnea, Congestive Heart Failure, Coronary Artery Heart Failure, Coronary Artery Heart Failure, Coronary Artery Disease, Hypertension, Disease, Hypertension, Disease, Hypertension, Osteoarthritis Osteoarthritis Osteoarthritis Date Acquired: 02/24/2020 06/05/2020 06/26/2020 Weeks of Treatment: 2 2 1  Wound Status: Open Open Open  Measurements L x W x D (cm) 6.3x8.5x0.3 1.1x0.7x0.1 0.4x0.4x0.1 Area (cm) : 42.058 0.605 0.126 Volume (cm) : 12.617 0.06 0.013 % Reduction in Area: -1861.70% -1851.60% -168.10% % Reduction in Volume: -2841.00% -1900.00% -160.00% Classification: Full Thickness Without Exposed Full Thickness Without Exposed Partial Thickness Support Structures Support Structures Exudate Amount: Medium Large Medium Exudate Type: Serosanguineous Serous Serous Exudate Color: red, brown amber amber Wound Margin: Flat and Intact Flat and Intact Flat and Intact Granulation Amount: Medium (34-66%) Medium (34-66%) Small (1-33%) Granulation Quality: Red Red Red Necrotic Amount: Medium (34-66%) Medium (34-66%) Small (1-33%) Exposed Structures: Fat Layer (Subcutaneous Tissue) Fat Layer (Subcutaneous Tissue) Fat Layer (Subcutaneous Tissue) Exposed: Yes Exposed: Yes Exposed: Yes Fascia: No Fascia: No Fascia: No Tendon: No Tendon: No Tendon: No Muscle: No Muscle: No Muscle: No Joint: No Joint: No Joint: No Bone: No Bone: No Bone: No Epithelialization: Small (1-33%) None Small (1-33%) Treatment Notes Electronic Signature(s) Signed: 07/07/2020 6:14:49 PM By: Elliot GurneyWoody, BSN, RN, CWS, [name] RN, BSN Entered By: Elliot GurneyWoody, BSN, RN, CWS, [name] on 07/07/2020 15:49:52 [name], [name]  A. ([id number]) [name], [name] ([id number]) -------------------------------------------------------------------------------- Multi-Disciplinary Care Plan Details Patient Name: [name], [name] Date of Service: 07/07/2020 3:00 PM Medical Record Number: [id number] Patient Account Number: [id number] Date of Birth/Sex: Aug 12, 1931 (86 y.o. M) Treating RN: Huel CoventryWoody, [name] Primary Care Cato Liburd: Marcelino DusterJOHNSTON, JOHN Other Clinician: Referring Tahje Borawski: Marcelino DusterJOHNSTON, JOHN Treating Eve Rey/Extender: Linwood DibblesSTONE III, HOYT Weeks in Treatment: 2 Active Inactive Necrotic Tissue Nursing Diagnoses: Impaired tissue integrity related to necrotic/devitalized tissue Goals: Necrotic/devitalized tissue will be minimized in the wound bed Date Initiated: 06/22/2020 Target Resolution Date: 06/28/2020 Goal Status: Active Interventions: Assess patient pain level pre-, during and post procedure and prior to discharge Treatment Activities: Apply topical anesthetic as ordered : 06/22/2020 Notes: Orientation to the Wound Care Program Nursing Diagnoses: Knowledge deficit related to the wound healing center program Goals: Patient/caregiver will verbalize understanding of the Wound Healing Center Program Date Initiated: 06/22/2020 Target Resolution Date: 06/29/2020 Goal Status: Active Interventions: Provide education on orientation to the wound center Notes: Venous Leg Ulcer Nursing Diagnoses: Actual venous Insuffiency (use after diagnosis is confirmed) Goals: Patient will maintain optimal edema control Date Initiated: 06/22/2020 Target Resolution Date: 06/29/2020 Goal Status: Active Interventions: Assess peripheral edema status every visit. Treatment Activities: Therapeutic compression applied : 06/22/2020 Notes: Wound/Skin Impairment Nursing Diagnoses: Impaired tissue integrity [name], [name] ([id number]) Goals: Ulcer/skin breakdown will have a volume reduction of 30% by week 4 Date Initiated: 06/22/2020 Target  Resolution Date: 07/23/2020 Goal Status: Active Interventions: Assess ulceration(s) every visit Treatment Activities: Referred to DME Varshini Arrants for dressing supplies : 06/22/2020 Notes: Electronic Signature(s) Signed: 07/07/2020 6:14:49 PM By: Elliot GurneyWoody, BSN, RN, CWS, [name] RN, BSN Entered By: Elliot GurneyWoody, BSN, RN, CWS, [name] on 07/07/2020 15:49:34 [name], [name] ([id number]) -------------------------------------------------------------------------------- Pain Assessment Details Patient Name: [name], [name] Date of Service: 07/07/2020 3:00 PM Medical Record Number: [id number] Patient Account Number: [id number] Date of Birth/Sex: Aug 12, 1931 (86 y.o. M) Treating RN: Tyler AasButler, Michelle Primary Care Charls Custer: Marcelino DusterJOHNSTON, JOHN Other Clinician: Referring Keshayla Schrum: Marcelino DusterJOHNSTON, JOHN Treating Chriss Mannan/Extender: Linwood DibblesSTONE III, HOYT Weeks in Treatment: 2 Active Problems Location of Pain Severity and Description of Pain Patient Has Paino Yes Site Locations Rate the pain. Current Pain Level: 8 Character of Pain Describe the Pain: Shooting Pain Management and Medication Current Pain Management: Rest: Yes Electronic Signature(s) Signed: 07/07/2020 4:49:15 PM By: Tyler AasButler, Michelle Entered By: Tyler AasButler, Michelle on 07/07/2020 15:07:45 [name], [name] ([id number]) -------------------------------------------------------------------------------- Wound Assessment Details Patient Name: [name], Mariel A. Date of Service:  07/07/2020 3:00 PM Medical Record Number: [id number] Patient Account Number: [id number] Date of Birth/Sex: 1931/03/23 (86 y.o. M) Treating RN: Tyler Aas Primary Care Lemya Greenwell: [name] Other Clinician: Referring Prynce Jacober: [name] Treating Bama Hanselman/Extender: Linwood Dibbles, HOYT Weeks in Treatment: 2 Wound Status Wound Number: 3 Primary Venous Leg Ulcer Etiology: Wound Location: Left, Medial Lower Leg Secondary Lymphedema Wounding Event: Gradually Appeared Etiology: Date Acquired:  02/24/2020 Wound Status: Open Weeks Of Treatment: 2 Comorbid Cataracts, Sleep Apnea, Congestive Heart Failure, Clustered Wound: No History: Coronary Artery Disease, Hypertension, Osteoarthritis Photos Wound Measurements Length: (cm) 6.3 Width: (cm) 8.5 Depth: (cm) 0.3 Area: (cm) 42.058 Volume: (cm) 12.617 % Reduction in Area: -1861.7% % Reduction in Volume: -2841% Epithelialization: Small (1-33%) Wound Description Classification: Full Thickness Without Exposed Support Struct Wound Margin: Flat and Intact Exudate Amount: Medium Exudate Type: Serosanguineous Exudate Color: red, brown ures Foul Odor After Cleansing: No Slough/Fibrino Yes Wound Bed Granulation Amount: Medium (34-66%) Exposed Structure Granulation Quality: Red Fascia Exposed: No Necrotic Amount: Medium (34-66%) Fat Layer (Subcutaneous Tissue) Exposed: Yes Necrotic Quality: Adherent Slough Tendon Exposed: No Muscle Exposed: No Joint Exposed: No Bone Exposed: No Treatment Notes Wound #3 (Left, Medial Lower Leg) Notes zinc to periwound, Scell, xsorb, Kerlix, Tubi G Electronic Signature(s) MERWYN, HODAPP ([id number]) Signed: 07/07/2020 4:49:15 PM By: Tyler Aas Entered By: Tyler Aas on 07/07/2020 15:26:15 [name], Marek A. ([id number]) -------------------------------------------------------------------------------- Wound Assessment Details Patient Name: [name] Hilts A. Date of Service: 07/07/2020 3:00 PM Medical Record Number: [id number] Patient Account Number: [id number] Date of Birth/Sex: Aug 20, 1931 (86 y.o. M) Treating RN: Tyler Aas Primary Care Zonya Gudger: [name] Other Clinician: Referring Codi Kertz: [name] Treating Treyon Wymore/Extender: Linwood Dibbles, HOYT Weeks in Treatment: 2 Wound Status Wound Number: 4 Primary Venous Leg Ulcer Etiology: Wound Location: Right, Anterior Lower Leg Secondary Lymphedema Wounding Event: Gradually Appeared Etiology: Date Acquired:  06/05/2020 Wound Status: Open Weeks Of Treatment: 2 Comorbid Cataracts, Sleep Apnea, Congestive Heart Failure, Clustered Wound: No History: Coronary Artery Disease, Hypertension, Osteoarthritis Photos Wound Measurements Length: (cm) 1.1 Width: (cm) 0.7 Depth: (cm) 0.1 Area: (cm) 0.605 Volume: (cm) 0.06 % Reduction in Area: -1851.6% % Reduction in Volume: -1900% Epithelialization: None Wound Description Classification: Full Thickness Without Exposed Support Struct Wound Margin: Flat and Intact Exudate Amount: Large Exudate Type: Serous Exudate Color: amber ures Foul Odor After Cleansing: No Slough/Fibrino Yes Wound Bed Granulation Amount: Medium (34-66%) Exposed Structure Granulation Quality: Red Fascia Exposed: No Necrotic Amount: Medium (34-66%) Fat Layer (Subcutaneous Tissue) Exposed: Yes Necrotic Quality: Adherent Slough Tendon Exposed: No Muscle Exposed: No Joint Exposed: No Bone Exposed: No Treatment Notes Wound #4 (Right, Anterior Lower Leg) Notes zinc to periwound, Scell, xsorb, Kerlix, Tubi G Electronic Signature(s) GAUTAM, LANGHORST ([id number]) Signed: 07/07/2020 4:49:15 PM By: Tyler Aas Entered By: Tyler Aas on 07/07/2020 15:26:41 Arkin, Keddrick A. ([id number]) -------------------------------------------------------------------------------- Wound Assessment Details Patient Name: [name] Hilts A. Date of Service: 07/07/2020 3:00 PM Medical Record Number: [id number] Patient Account Number: [id number] Date of Birth/Sex: 03/17/1931 (86 y.o. M) Treating RN: Tyler Aas Primary Care Nely Dedmon: [name] Other Clinician: Referring Elizabet Schweppe: [name] Treating Shenica Holzheimer/Extender: Linwood Dibbles, HOYT Weeks in Treatment: 2 Wound Status Wound Number: 5 Primary Venous Leg Ulcer Etiology: Wound Location: Right, Medial Lower Leg Wound Open Wounding Event: Gradually Appeared Status: Date Acquired: 06/26/2020 Comorbid Cataracts, Sleep Apnea,  Congestive Heart Failure, Weeks Of Treatment: 1 History: Coronary Artery Disease, Hypertension, Osteoarthritis Clustered Wound: No Photos Wound Measurements Length: (cm) 0.4 Width: (cm) 0.4 Depth: (cm) 0.1 Area: (cm) 0.12 Volume: (cm)  0.01 % Reduction in Area: -168.1% % Reduction in Volume: -160% Epithelialization: Small (1-33%) 6 3 Wound Description Classification: Partial Thickness Fou Wound Margin: Flat and Intact Slo Exudate Amount: Medium Exudate Type: Serous Exudate Color: amber l Odor After Cleansing: No ugh/Fibrino No Wound Bed Granulation Amount: Small (1-33%) Exposed Structure Granulation Quality: Red Fascia Exposed: No Necrotic Amount: Small (1-33%) Fat Layer (Subcutaneous Tissue) Exposed: Yes Necrotic Quality: Adherent Slough Tendon Exposed: No Muscle Exposed: No Joint Exposed: No Bone Exposed: No Treatment Notes Wound #5 (Right, Medial Lower Leg) Notes zinc to periwound, Scell, xsorb, Kerlix, Tubi G Electronic Signature(s) Signed: 07/07/2020 4:49:15 PM By: Sarajane Marek, Toretto A. ([id number]) Entered By: Tyler Aas on 07/07/2020 15:29:24 [name] Hilts A. ([id number]) -------------------------------------------------------------------------------- Vitals Details Patient Name: [name] Hilts A. Date of Service: 07/07/2020 3:00 PM Medical Record Number: [id number] Patient Account Number: [id number] Date of Birth/Sex: 1931-01-04 (86 y.o. M) Treating RN: Tyler Aas Primary Care Doha Boling: [name] Other Clinician: Referring Tamalyn Wadsworth: [name] Treating Edrick Whitehorn/Extender: Linwood Dibbles, HOYT Weeks in Treatment: 2 Vital Signs Time Taken: 15:06 Temperature (F): 97.6 Height (in): 71 Pulse (bpm): 69 Weight (lbs): 220 Respiratory Rate (breaths/min): 18 Body Mass Index (BMI): 30.7 Blood Pressure (mmHg): 141/66 Reference Range: 80 - 120 mg / dl Electronic Signature(s) Signed: 07/07/2020 4:49:15 PM By: Tyler Aas Entered By: Tyler Aas on 07/07/2020 15:07:10

## 2020-07-07 NOTE — Telephone Encounter (Signed)
Spoke with patients daughter and reviewed plan of care and that we would see him on Monday. She verbalized understanding of instructions with no further questions at this time.

## 2020-07-10 ENCOUNTER — Encounter: Payer: Self-pay | Admitting: Emergency Medicine

## 2020-07-10 ENCOUNTER — Other Ambulatory Visit: Payer: Self-pay

## 2020-07-10 ENCOUNTER — Encounter: Payer: Medicare Other | Admitting: Physician Assistant

## 2020-07-10 ENCOUNTER — Inpatient Hospital Stay
Admission: EM | Admit: 2020-07-10 | Discharge: 2020-07-14 | DRG: 602 | Disposition: A | Discharge status: Skilled Nursing Facility | Payer: Medicare Other | Source: Ambulatory Visit | Attending: Family Medicine | Admitting: Family Medicine

## 2020-07-10 ENCOUNTER — Telehealth: Payer: Self-pay | Admitting: Cardiovascular Disease

## 2020-07-10 ENCOUNTER — Ambulatory Visit: Payer: Medicare Other | Admitting: Family

## 2020-07-10 DIAGNOSIS — Z823 Family history of stroke: Secondary | ICD-10-CM

## 2020-07-10 DIAGNOSIS — Z79899 Other long term (current) drug therapy: Secondary | ICD-10-CM

## 2020-07-10 DIAGNOSIS — Z7901 Long term (current) use of anticoagulants: Secondary | ICD-10-CM

## 2020-07-10 DIAGNOSIS — L03116 Cellulitis of left lower limb: Principal | ICD-10-CM | POA: Diagnosis present

## 2020-07-10 DIAGNOSIS — L03119 Cellulitis of unspecified part of limb: Secondary | ICD-10-CM

## 2020-07-10 DIAGNOSIS — I878 Other specified disorders of veins: Secondary | ICD-10-CM | POA: Diagnosis present

## 2020-07-10 DIAGNOSIS — L03115 Cellulitis of right lower limb: Secondary | ICD-10-CM | POA: Diagnosis present

## 2020-07-10 DIAGNOSIS — I872 Venous insufficiency (chronic) (peripheral): Secondary | ICD-10-CM | POA: Diagnosis present

## 2020-07-10 DIAGNOSIS — N179 Acute kidney failure, unspecified: Secondary | ICD-10-CM

## 2020-07-10 DIAGNOSIS — K219 Gastro-esophageal reflux disease without esophagitis: Secondary | ICD-10-CM | POA: Diagnosis present

## 2020-07-10 DIAGNOSIS — I5032 Chronic diastolic (congestive) heart failure: Secondary | ICD-10-CM

## 2020-07-10 DIAGNOSIS — G2581 Restless legs syndrome: Secondary | ICD-10-CM

## 2020-07-10 DIAGNOSIS — Z8249 Family history of ischemic heart disease and other diseases of the circulatory system: Secondary | ICD-10-CM

## 2020-07-10 DIAGNOSIS — I350 Nonrheumatic aortic (valve) stenosis: Secondary | ICD-10-CM

## 2020-07-10 DIAGNOSIS — I5033 Acute on chronic diastolic (congestive) heart failure: Secondary | ICD-10-CM | POA: Diagnosis present

## 2020-07-10 DIAGNOSIS — R609 Edema, unspecified: Secondary | ICD-10-CM

## 2020-07-10 DIAGNOSIS — T502X5A Adverse effect of carbonic-anhydrase inhibitors, benzothiadiazides and other diuretics, initial encounter: Secondary | ICD-10-CM | POA: Diagnosis not present

## 2020-07-10 DIAGNOSIS — F419 Anxiety disorder, unspecified: Secondary | ICD-10-CM | POA: Diagnosis present

## 2020-07-10 DIAGNOSIS — L0390 Cellulitis, unspecified: Secondary | ICD-10-CM | POA: Diagnosis present

## 2020-07-10 DIAGNOSIS — I2510 Atherosclerotic heart disease of native coronary artery without angina pectoris: Secondary | ICD-10-CM | POA: Diagnosis present

## 2020-07-10 DIAGNOSIS — I130 Hypertensive heart and chronic kidney disease with heart failure and stage 1 through stage 4 chronic kidney disease, or unspecified chronic kidney disease: Secondary | ICD-10-CM | POA: Diagnosis present

## 2020-07-10 DIAGNOSIS — E785 Hyperlipidemia, unspecified: Secondary | ICD-10-CM | POA: Diagnosis present

## 2020-07-10 DIAGNOSIS — N1832 Chronic kidney disease, stage 3b: Secondary | ICD-10-CM | POA: Diagnosis present

## 2020-07-10 DIAGNOSIS — E876 Hypokalemia: Secondary | ICD-10-CM | POA: Diagnosis not present

## 2020-07-10 DIAGNOSIS — I4821 Permanent atrial fibrillation: Secondary | ICD-10-CM | POA: Diagnosis present

## 2020-07-10 DIAGNOSIS — H9191 Unspecified hearing loss, right ear: Secondary | ICD-10-CM | POA: Diagnosis present

## 2020-07-10 DIAGNOSIS — G629 Polyneuropathy, unspecified: Secondary | ICD-10-CM | POA: Diagnosis present

## 2020-07-10 DIAGNOSIS — Z952 Presence of prosthetic heart valve: Secondary | ICD-10-CM

## 2020-07-10 DIAGNOSIS — F329 Major depressive disorder, single episode, unspecified: Secondary | ICD-10-CM | POA: Diagnosis present

## 2020-07-10 DIAGNOSIS — Z8711 Personal history of peptic ulcer disease: Secondary | ICD-10-CM

## 2020-07-10 DIAGNOSIS — Z8673 Personal history of transient ischemic attack (TIA), and cerebral infarction without residual deficits: Secondary | ICD-10-CM

## 2020-07-10 DIAGNOSIS — Z20822 Contact with and (suspected) exposure to covid-19: Secondary | ICD-10-CM | POA: Diagnosis present

## 2020-07-10 DIAGNOSIS — Z87891 Personal history of nicotine dependence: Secondary | ICD-10-CM

## 2020-07-10 DIAGNOSIS — D649 Anemia, unspecified: Secondary | ICD-10-CM | POA: Diagnosis present

## 2020-07-10 DIAGNOSIS — G4733 Obstructive sleep apnea (adult) (pediatric): Secondary | ICD-10-CM | POA: Diagnosis present

## 2020-07-10 DIAGNOSIS — Z882 Allergy status to sulfonamides status: Secondary | ICD-10-CM

## 2020-07-10 DIAGNOSIS — R0600 Dyspnea, unspecified: Secondary | ICD-10-CM

## 2020-07-10 DIAGNOSIS — Z881 Allergy status to other antibiotic agents status: Secondary | ICD-10-CM

## 2020-07-10 LAB — CBC WITH DIFFERENTIAL/PLATELET
Abs Immature Granulocytes: 0.04 10*3/uL (ref 0.00–0.07)
Basophils Absolute: 0 10*3/uL (ref 0.0–0.1)
Basophils Relative: 0 %
Eosinophils Absolute: 0.1 10*3/uL (ref 0.0–0.5)
Eosinophils Relative: 1 %
HCT: 30.5 % — ABNORMAL LOW (ref 39.0–52.0)
Hemoglobin: 10.4 g/dL — ABNORMAL LOW (ref 13.0–17.0)
Immature Granulocytes: 0 %
Lymphocytes Relative: 9 %
Lymphs Abs: 0.8 10*3/uL (ref 0.7–4.0)
MCH: 29.3 pg (ref 26.0–34.0)
MCHC: 34.1 g/dL (ref 30.0–36.0)
MCV: 85.9 fL (ref 80.0–100.0)
Monocytes Absolute: 0.9 10*3/uL (ref 0.1–1.0)
Monocytes Relative: 10 %
Neutro Abs: 7.4 10*3/uL (ref 1.7–7.7)
Neutrophils Relative %: 80 %
Platelets: 212 10*3/uL (ref 150–400)
RBC: 3.55 MIL/uL — ABNORMAL LOW (ref 4.22–5.81)
RDW: 15.8 % — ABNORMAL HIGH (ref 11.5–15.5)
WBC: 9.2 10*3/uL (ref 4.0–10.5)
nRBC: 0 % (ref 0.0–0.2)

## 2020-07-10 LAB — COMPREHENSIVE METABOLIC PANEL
ALT: 10 U/L (ref 0–44)
AST: 22 U/L (ref 15–41)
Albumin: 3.8 g/dL (ref 3.5–5.0)
Alkaline Phosphatase: 108 U/L (ref 38–126)
Anion gap: 13 (ref 5–15)
BUN: 44 mg/dL — ABNORMAL HIGH (ref 8–23)
CO2: 31 mmol/L (ref 22–32)
Calcium: 8.9 mg/dL (ref 8.9–10.3)
Chloride: 89 mmol/L — ABNORMAL LOW (ref 98–111)
Creatinine, Ser: 1.46 mg/dL — ABNORMAL HIGH (ref 0.61–1.24)
GFR calc Af Amer: 50 mL/min — ABNORMAL LOW (ref >60)
GFR calc non Af Amer: 43 mL/min — ABNORMAL LOW (ref >60)
Glucose, Bld: 105 mg/dL — ABNORMAL HIGH (ref 70–99)
Potassium: 3.5 mmol/L (ref 3.5–5.1)
Sodium: 133 mmol/L — ABNORMAL LOW (ref 135–145)
Total Bilirubin: 1.9 mg/dL — ABNORMAL HIGH (ref 0.3–1.2)
Total Protein: 7.1 g/dL (ref 6.5–8.1)

## 2020-07-10 LAB — LACTIC ACID, PLASMA: Lactic Acid, Venous: 1 mmol/L (ref 0.5–1.9)

## 2020-07-10 MED ORDER — TRAMADOL HCL 50 MG PO TABS
50.0000 mg | ORAL_TABLET | Freq: Two times a day (BID) | ORAL | Status: DC | PRN
  Administered 2020-07-11: 50 mg via ORAL
  Filled 2020-07-10: qty 1

## 2020-07-10 MED ORDER — ENOXAPARIN SODIUM 40 MG/0.4ML ~~LOC~~ SOLN: 40.0000 mg | SUBCUTANEOUS | Status: DC

## 2020-07-10 MED ORDER — VANCOMYCIN HCL IN DEXTROSE 1-5 GM/200ML-% IV SOLN
1000.0000 mg | Freq: Once | INTRAVENOUS | Status: AC
  Administered 2020-07-10: 1000 mg via INTRAVENOUS
  Filled 2020-07-10: qty 200

## 2020-07-10 MED ORDER — FUROSEMIDE 10 MG/ML IJ SOLN
60.0000 mg | Freq: Once | INTRAMUSCULAR | Status: AC
  Administered 2020-07-11: 60 mg via INTRAVENOUS
  Filled 2020-07-10: qty 8

## 2020-07-10 NOTE — H&P (Signed)
History and Physical    [name] MVH:[id number] DOB: January 08, 1931 DOA: 07/10/2020  PCP: Gracelyn NurseJohnston, [name] D, MD  Patient coming from: Home, lives alone but daughter is at bedside  I have personally briefly reviewed patient's old medical records [facility]  Chief Complaint: Lower extremity cellulitis  HPI: [name] is a 86 y.o. male with medical history significant for aortic stenosis s/p mechanical TAVR on Coumadin, chronic diastolic heart failure, hypertension, CAD, chronic venous stasis, GERD and history of C. difficile who presents with concerns of suspected worsening cellulitis.  Patient was unsure why he was sent to the ED in the first place. Daughter at bedside provides him with a history. She says that back in January he began to have issues with bilateral lower extremity edema and had left lower extremity venous ablation performed back in May with vascular surgery. Since then he has been followed by wound care for wraps and also has been seeing cardiology to adjust his diuretic but has also been dealing with renal insufficiency. He was recently started on metolazone in addition to his Lasix.  This week patient has had worsening lower extremity edema and was seen by wound care today who advised that he present to the ED for increased erythema suspicious for cellulitis. Patient denies any chills or fevers. No nausea or vomiting. He is in discomfort and complains of pain to his bilateral hip which he has had surgery in the past. He denies any recent falls.   ED Course: He was afebrile and normotensive on room air.CBC showed no leukocytosis, had normocytic anemia stable with hemoglobin of 10.4 which is chronic. Creatinine is elevated at 1.46 which has been stable for the past 2 weeks.  Review of Systems:  Constitutional: No Weight Change, No Fever ENT/Mouth: No sore throat, No Rhinorrhea Eyes: No Eye Pain, No Vision Changes Cardiovascular: No Chest Pain, no  SOB Respiratory: No Cough, No Sputum, Gastrointestinal: No Nausea, No Vomiting Genitourinary: no Urinary Incontinence, No Urgency, No Flank Pain Musculoskeletal: No Arthralgias, No Myalgias Skin: No Skin Lesions, No Pruritus, Neuro: no Weakness, No Numbness Psych: No Anxiety/Panic, No Depression, +decrease appetite Heme/Lymph: No Bruising, No Bleeding   Past Medical History:  Diagnosis Date   Anxiety 10/11   Aortic stenosis    a. s/p mechcanical AVR, 2002; b. 10/2018 Echo: Triv AI, mean grad 9mmHg.   Bradycardia    chronic, no symptoms 07/2010   C. difficile colitis    Carotid bruit    dopplers in past, no abnormalities   Chronic diastolic CHF (congestive heart failure) (HCC)    a. Echo 03/2015: EF 60-65%, no RWMA, GR1DD, mild BAE, mild to mod MR, mod TR, PASP 65 mmHg; b. 01/2017 Echo: EF 55-60%, NRWMA, grade 1 diastolic dysfunction.  Normal functioning prosthetic aortic valve.  Mean gradient 50 mmHg.  Sev TR. PASP 54mmHg; c. 10/2018 Echo: EF 55-60%, Triv AI, mod dil LA, mod-sev TR, PASP 35-40, mild to mod red RV fxn.   Coronary artery disease    a. mild, cath, 08/2010; b. medically managed   Decreased hearing    Right ear   Depression    Gastric ulcer    GERD (gastroesophageal reflux disease)    Hypertension    BP higher than usual 04/19/10; amlodipine increased by telephone   Mod-Sev Tricuspid regurgitation    a. 10/2018 Echo: Mod-Sev TR, PASP 35-7940mmHg.   RLS (restless legs syndrome) 08/23/2015   S/P AVR    a. [employer]. mechanical 2002;  b. echo 08/2010 EF 60%, trival AI, mild MR, AVR working well; c. on longterm warfarin tx   SOB (shortness of breath) 10/11   08/2010,Episodes at 5 AM, eventually felt to be anxiety, after complete workup including catheterization, pt greatly improved with anxiety meds 11/11    Past Surgical History:  Procedure Laterality Date   CARDIAC CATHETERIZATION     ESOPHAGOGASTRODUODENOSCOPY N/A 04/05/2015   Procedure:  ESOPHAGOGASTRODUODENOSCOPY (EGD);  Surgeon: Scot Jun, MD;  Location[facility] ENDOSCOPY;  Service: Endoscopy;  Laterality: N/A;   ESOPHAGOGASTRODUODENOSCOPY N/A 04/17/2015   Procedure: ESOPHAGOGASTRODUODENOSCOPY (EGD);  Surgeon: Scot Jun, MD;  Location: [facility] ENDOSCOPY;  Service: Endoscopy;  Laterality: N/A;   ESOPHAGOGASTRODUODENOSCOPY N/A 08/02/2015   Procedure: ESOPHAGOGASTRODUODENOSCOPY (EGD);  Surgeon: Wallace Cullens, MD;  Location: [facility] ENDOSCOPY;  Service: Endoscopy;  Laterality: N/A;   HERNIA REPAIR     JOINT REPLACEMENT     TOTAL HIP ARTHROPLASTY     VALVE REPLACEMENT  1/02   Aortic; echo 3/09 valve working well; echo 10/11 working well; put on Coumadin     reports that he quit smoking about 42 years ago. His smoking use included cigarettes. He has never used smokeless tobacco. He reports current alcohol use of about 3.0 standard drinks of alcohol per week. He reports that he does not use drugs. Social History  Allergies  Allergen Reactions   Sulfa Antibiotics Other (See Comments)    Reaction:  Unknown    Levofloxacin Nausea Only    Family History  Problem Relation Age of Onset   Hypertension Mother    Diabetes type II Mother    Heart disease Mother    Heart attack Mother    Hypertension Father    Heart disease Father    Stroke Father    Stroke Brother    Stroke Brother    Hypertension Other      Prior to Admission medications   Medication Sig Start Date End Date Taking? Authorizing Provider  allopurinol (ZYLOPRIM) 300 MG tablet One tab daily, 90 tabs 05/16/20   [provider]  amoxicillin (AMOXIL) 500 MG capsule 4 CAPSULES PRIOR TO DENTAL PROCEDURES AS DIRECTED 06/27/20   [provider]  cephALEXin (KEFLEX) 500 MG capsule Take 1 capsule (500 mg total) by mouth 3 (three) times daily. 04/20/20   Georgiana Spinner, NP  Cetirizine HCl 10 MG CAPS Take 10 mg by mouth daily.    [provider]  citalopram (CELEXA) 20 MG tablet  Take 20 mg by mouth daily.    [provider]  cloNIDine (CATAPRES) 0.1 MG tablet TAKE 1 TABLET BY MOUTH  TWICE DAILY Patient taking differently: Take 0.1 mg by mouth 2 (two) times daily.  01/05/20   Antonieta Iba, MD  doxazosin (CARDURA) 8 MG tablet Take 8 mg by mouth 2 (two) times daily.  02/09/20   [provider]  finasteride (PROSCAR) 5 MG tablet Take 5 mg by mouth daily.      [provider]  isosorbide mononitrate (IMDUR) 60 MG 24 hr tablet Take 1 tablet (60 mg total) by mouth 2 (two) times daily. 05/05/20   Creig Hines, NP  metolazone (ZAROXOLYN) 2.5 MG tablet Take 1 tablet (2.5 mg total) by mouth 2 (two) times a week. 02/17/20 07/03/20  Antonieta Iba, MD  metoprolol succinate (TOPROL-XL) 25 MG 24 hr tablet Take 0.5 tablets (12.5 mg total) by mouth daily. 03/06/20   Antonieta Iba, MD  potassium chloride (KLOR-CON) 10 MEQ tablet  TAKE 3 TABLETS (30 MEQ) BY MOUTH DAILY. TAKE AN ADDITIONAL 2TABS ON THE DAYS YOU TAKE METOLAZONE 06/08/20   Gollan, Tollie Pizza, MD  rOPINIRole (REQUIP) 2 MG tablet TAKE 1 TABLET BY MOUTH 4  TIMES DAILY Patient taking differently: Take 2 mg by mouth in the morning, at noon, in the evening, and at bedtime. Take 1 tablet by mouth 4  times daily 09/14/18   Butch Penny, NP  Tamsulosin HCl (FLOMAX) 0.4 MG CAPS Take 0.4 mg by mouth at bedtime.     [provider]  torsemide (DEMADEX) 20 MG tablet TAKE 2 TABLETS BY MOUTH  TWICE DAILY Patient taking differently: Take 40 mg by mouth 2 (two) times daily. TAKE 2 TABLETS BY MOUTH  TWICE DAILY 04/05/20   Antonieta Iba, MD  traMADol (ULTRAM) 50 MG tablet Take 50 mg by mouth 2 (two) times daily as needed. 11/02/19   [provider]  traZODone (DESYREL) 50 MG tablet Take 50 mg by mouth at bedtime.    [provider]  warfarin (COUMADIN) 5 MG tablet TAKE 1 TABLET BY MOUTH  DAILY OR AS DIRECTED BY THE ANTICOAGULATION CLINIC 06/19/20   Antonieta Iba, MD     Physical Exam: Vitals:   07/10/20 1159 07/10/20 1200 07/10/20 1606 07/10/20 2201  BP: 125/65  125/61 111/63  Pulse: 61  (!) 56 72  Resp: 17  18 18   Temp: 98.1 F (36.7 C)  97.7 F (36.5 C) 97.8 F (36.6 C)  TempSrc: Oral  Oral Oral  SpO2: 99%  95% 100%  Weight:  101 kg    Height:  5\' 11"  (1.803 m)      Constitutional: NAD, elderly male laying flat and restless in bed due to lower extremity and hip pain Vitals:   07/10/20 1159 07/10/20 1200 07/10/20 1606 07/10/20 2201  BP: 125/65  125/61 111/63  Pulse: 61  (!) 56 72  Resp: 17  18 18   Temp: 98.1 F (36.7 C)  97.7 F (36.5 C) 97.8 F (36.6 C)  TempSrc: Oral  Oral Oral  SpO2: 99%  95% 100%  Weight:  101 kg    Height:  5\' 11"  (1.803 m)     Eyes: PERRL, lids and conjunctivae normal ENMT: Mucous membranes are moist.  Neck: normal, supple Respiratory: clear to auscultation bilaterally, no wheezing, no crackles. Normal respiratory effort. No accessory muscle use.  Cardiovascular: Regular rate and rhythm, no murmurs / rubs / gallops. +3 pitting edema up to knees bilaterally  abdomen: no tenderness, no masses palpated.  Bowel sounds positive.  Musculoskeletal: no clubbing / cyanosis. No joint deformity upper and lower extremities. Good ROM, no contractures. Normal muscle tone.  Skin: Dry scaly skin on bilateral lower extremity with scattered superficial ulcers on both legs with erythema of bilateral pretibial region, significant weeping of fluid, and slight increased warmth to touch. Neurologic: CN 2-12 grossly intact. Sensation intact, Strength 5/5 in all 4.  Psychiatric: Normal judgment and insight. Alert and oriented x 3. Normal mood.     Labs on Admission: I have personally reviewed following labs and imaging studies  CBC: Recent Labs  Lab 07/10/20 1201  WBC 9.2  NEUTROABS 7.4  HGB 10.4*  HCT 30.5*  MCV 85.9  PLT 212   Basic Metabolic Panel: Recent Labs  Lab 07/06/20 1037 07/10/20 1201  NA 133* 133*  K  3.0* 3.5  CL 91* 89*  CO2 29 31  GLUCOSE 102* 105*  BUN 43* 44*  CREATININE 1.48*  1.46*  CALCIUM 8.5* 8.9   GFR: Estimated Creatinine Clearance: 44 mL/min (A) (by C-G formula based on SCr of 1.46 mg/dL (H)). Liver Function Tests: Recent Labs  Lab 07/10/20 1201  AST 22  ALT 10  ALKPHOS 108  BILITOT 1.9*  PROT 7.1  ALBUMIN 3.8   No results for input(s): LIPASE, AMYLASE in the last 168 hours. No results for input(s): AMMONIA in the last 168 hours. Coagulation Profile: No results for input(s): INR, PROTIME in the last 168 hours. Cardiac Enzymes: No results for input(s): CKTOTAL, CKMB, CKMBINDEX, TROPONINI in the last 168 hours. BNP (last 3 results) No results for input(s): PROBNP in the last 8760 hours. HbA1C: No results for input(s): HGBA1C in the last 72 hours. CBG: No results for input(s): GLUCAP in the last 168 hours. Lipid Profile: No results for input(s): CHOL, HDL, LDLCALC, TRIG, CHOLHDL, LDLDIRECT in the last 72 hours. Thyroid Function Tests: No results for input(s): TSH, T4TOTAL, FREET4, T3FREE, THYROIDAB in the last 72 hours. Anemia Panel: No results for input(s): VITAMINB12, FOLATE, FERRITIN, TIBC, IRON, RETICCTPCT in the last 72 hours. Urine analysis:    Component Value Date/Time   COLORURINE YELLOW (A) 11/24/2018 2006   APPEARANCEUR CLEAR (A) 11/24/2018 2006   APPEARANCEUR Clear 03/20/2015 1146   LABSPEC 1.017 11/24/2018 2006   LABSPEC 1.033 03/20/2015 1146   PHURINE 5.0 11/24/2018 2006   GLUCOSEU NEGATIVE 11/24/2018 2006   GLUCOSEU Negative 03/20/2015 1146   HGBUR SMALL (A) 11/24/2018 2006   BILIRUBINUR NEGATIVE 11/24/2018 2006   BILIRUBINUR 1+ 03/20/2015 1146   KETONESUR NEGATIVE 11/24/2018 2006   PROTEINUR NEGATIVE 11/24/2018 2006   NITRITE NEGATIVE 11/24/2018 2006   LEUKOCYTESUR NEGATIVE 11/24/2018 2006   LEUKOCYTESUR Negative 03/20/2015 1146    Radiological Exams on Admission: No results found.    Assessment/Plan  Suspected cellulitis  of bilateral lower extremity in the setting of chronic venous stasis changes and chronic lower extremity edema Continue IV vancomycin and monitor erythema Wound care consult  Lower extremity edema/chronic venous stasis Continue metolazone Switch to IV Lasix 40 mg daily Monitor I's and O's and renal function  AKI Appears that he has been having renal insufficiency since end of July likely due to increased diuretic use for his lower extremity edema Monitor closely while on diuretics Avoid nephrotoxic agent as much as possible  aortic stenosis s/p mechanical TAVR Warfarin consult per pharmacy Check INR  Chronic diastolic heart failure Aside from lower extremity edema likely due to chronic venous insufficiency does not appear to be in any acute heart failure Continue Imdur, metoprolol  Restless leg syndrome Continue Requip  DVT prophylaxis:warfarin Code Status: Full Family Communication: Plan discussed with patient and daughter at bedside  disposition Plan: Home with observation Consults called:  Admission status: Observation  Status is: Observation  The patient remains OBS appropriate and will [name]/c before 2 midnights.  Dispo: The patient is from: Home              Anticipated [name]/c is to: Home              Anticipated [name]/c date is: 1 day              Patient currently is not medically stable to [name]/c.         Anselm Jungling DO [facility]   If 7PM-7AM, please contact night-coverage [email or web address]   07/10/2020, 11:51 PM

## 2020-07-10 NOTE — ED Notes (Addendum)
Pt reports swelling for past month. Reports taking lasix, which he says helps, but has not countered the swelling. Reports pain   Pt presents with swelling to bilateral lower extremities, +2 edema. Redness and serous drainage noted. Pt denies current antibiotics use

## 2020-07-10 NOTE — Progress Notes (Addendum)
Rubendall, Susana A. ([id number]) Visit Report for 07/10/2020 Arrival Information Details Patient Name: [name], [name] Date of Service: 07/10/2020 9:15 AM Medical Record Number: [id number] Patient Account Number: [id number] Date of Birth/Sex: 04-19-1931 (86 y.o. M) Treating RN: Huel Coventry Primary Care Landyn Buckalew: [name] Other Clinician: Referring Chinara Hertzberg: [name] Treating Syndey Jaskolski/Extender: Linwood Dibbles, HOYT Weeks in Treatment: 2 Visit Information History Since Last Visit All ordered tests and consults were completed: No Patient Arrived: Dan Humphreys Added or deleted any medications: No Arrival Time: 09:59 Any new allergies or adverse reactions: No Accompanied By: daughter Had a fall or experienced change in No Transfer Assistance: None activities of daily living that may affect Patient Identification Verified: Yes risk of falls: Secondary Verification Process Completed: Yes Signs or symptoms of abuse/neglect since last visito No Patient Requires Transmission-Based No Hospitalized since last visit: No Precautions: Implantable device outside of the clinic excluding No Patient Has Alerts: Yes cellular tissue based products placed in the center Patient Alerts: Patient on Blood since last visit: Thinner Has Dressing in Place as Prescribed: Yes Warfarin Pain Present Now: Yes Electronic Signature(s) Signed: 07/10/2020 11:48:21 AM By: Benna Dunks Entered By: Benna Dunks on 07/10/2020 10:01:33 [name] A. ([id number]) -------------------------------------------------------------------------------- Clinic Level of Care Assessment Details Patient Name: [name] A. Date of Service: 07/10/2020 9:15 AM Medical Record Number: [id number] Patient Account Number: [id number] Date of Birth/Sex: Jul 28, 1931 (86 y.o. M) Treating RN: Rodell Perna Primary Care Ketra Duchesne: [name] Other Clinician: Referring Casilda Pickerill: [name] Treating Donaven Criswell/Extender: Linwood Dibbles, HOYT Weeks in Treatment: 2 Clinic Level of Care Assessment Items TOOL 4 Quantity Score  - Use when only an EandM is performed on FOLLOW-UP visit 0 ASSESSMENTS - Nursing Assessment / Reassessment X - Reassessment of Co-morbidities (includes updates in patient status) 1 10 X- 1 5 Reassessment of Adherence to Treatment Plan ASSESSMENTS - Wound and Skin Assessment / Reassessment  - Simple Wound Assessment / Reassessment - one wound 0 X- 4 5 ComplHARUTYUN, MONTEVERDEsment / Reassessment - multiple wounds  - 0 Dermatologic / Skin Assessment (not related to wound area) ASSESSMENTS - Focused Assessment X - Circumferential Edema Measurements - multi extremities 1 5  - 0 Nutritional Assessment / Counseling / Intervention X- 1 5 Lower Extremity Assessment (monofilament, tuning fork, pulses)  - 0 Peripheral Arterial Disease Assessment (using hand held doppler) ASSESSMENTS - Ostomy and/or Continence Assessment and Care  - Incontinence Assessment and Management 0  - 0 Ostomy Care Assessment and Management (repouching, etc.) PROCESS - Coordination of Care X - Simple Patient / Family Education for ongoing care 1 15  - 0 Complex (extensive) Patient / Family Education for ongoing care X- 1 10 Staff obtains Chiropractor, Records, Test Results / Process Orders  - 0 Staff telephones HHA, Nursing Homes / Clarify orders / etc  - 0 Routine Transfer to another Facility (non-emergent condition) X- 1 10 Routine Hospital Admission (non-emergent condition)  - 0 New Admissions / Manufacturing engineer / Ordering NPWT, Apligraf, etc.  - 0 Emergency Hospital Admission (emergent condition)  - 0 Simple Discharge Coordination  - 0 Complex (extensive) Discharge Coordination PROCESS - Special Needs  - Pediatric / Minor Patient Management 0  - 0 Isolation Patient Management  - 0 Hearing / Language / Visual special needs  - 0 Assessment of Community assistance  (transportation, D/C planning, etc.)  - 0 Additional assistance / Altered mentation  - 0 Support Surface(s) Assessment (bed, cushion, seat, etc.) INTERVENTIONS - Wound Cleansing / Measurement Zamarron, Phil A. ([id number])  -  0 Simple Wound Cleansing - one wound X- 4 5 Complex Wound Cleansing - multiple wounds X- 1 5 Wound Imaging (photographs - any number of wounds)  - 0 Wound Tracing (instead of photographs)  - 0 Simple Wound Measurement - one wound X- 4 5 Complex Wound Measurement - multiple wounds INTERVENTIONS - Wound Dressings  - Small Wound Dressing one or multiple wounds 0 X- 4 15 Medium Wound Dressing one or multiple wounds  - 0 Large Wound Dressing one or multiple wounds  - 0 Application of Medications - topical  - 0 Application of Medications - injection INTERVENTIONS - Miscellaneous  - External ear exam 0  - 0 Specimen Collection (cultures, biopsies, blood, body fluids, etc.)  - 0 Specimen(s) / Culture(s) sent or taken to Lab for analysis  - 0 Patient Transfer (multiple staff / Nurse, adult / Similar devices)  - 0 Simple Staple / Suture removal (25 or less)  - 0 Complex Staple / Suture removal (26 or more)  - 0 Hypo / Hyperglycemic Management (close monitor of Blood Glucose)  - 0 Ankle / Brachial Index (ABI) - do not check if billed separately X- 1 5 Vital Signs Has the patient been seen at the hospital within the last three years: Yes Total Score: 190 Level Of Care: New/Established - Level 5 Electronic Signature(s) Signed: 07/10/2020 3:21:14 PM By: Rodell Perna Entered By: Rodell Perna on 07/10/2020 11:13:28 [name] A. ([id number]) -------------------------------------------------------------------------------- Encounter Discharge Information Details Patient Name: [name] A. Date of Service: 07/10/2020 9:15 AM Medical Record Number: [id number] Patient Account Number: [id number] Date of Birth/Sex:  1931/08/13 (86 y.o. M) Treating RN: Rodell Perna Primary Care Jacquline Terrill: [name] Other Clinician: Referring Toyna Erisman: [name] Treating Kadi Hession/Extender: Linwood Dibbles, HOYT Weeks in Treatment: 2 Encounter Discharge Information Items Discharge Condition: Stable Ambulatory Status: Wheelchair Discharge Destination: Emergency Room Telephoned: Yes Orders Sent: Yes Transportation: Private Auto Accompanied By: daughter Schedule Follow-up Appointment: Yes Clinical Summary of Care: Electronic Signature(s) Signed: 07/10/2020 11:14:36 AM By: Rodell Perna Entered By: Rodell Perna on 07/10/2020 11:14:35 [name] A. ([id number]) -------------------------------------------------------------------------------- Lower Extremity Assessment Details Patient Name: [name] A. Date of Service: 07/10/2020 9:15 AM Medical Record Number: [id number] Patient Account Number: [id number] Date of Birth/Sex: [birth year] (86 y.o. M) Treating RN: Huel Coventry Primary Care Wallice Granville: [name] Other Clinician: Referring Trang Bouse: [name] Treating Brytni Dray/Extender: Linwood Dibbles, HOYT Weeks in Treatment: 2 Edema Assessment Assessed: [Left: No] [Right: No] Edema: [Left: Yes] [Right: Yes] Calf Left: Right: Point of Measurement: 36 cm From Medial Instep 45 cm 43 cm Ankle Left: Right: Point of Measurement: 12 cm From Medial Instep 25 cm 24 cm Vascular Assessment Pulses: Dorsalis Pedis Palpable: [Left:Yes] [Right:Yes] Electronic Signature(s) Signed: 07/10/2020 11:48:21 AM By: Benna Dunks Signed: 07/10/2020 4:06:30 PM By: Elliot Gurney, BSN, RN, CWS, [name] RN, BSN Entered By: Benna Dunks on 07/10/2020 10:26:39 [name] A. ([id number]) -------------------------------------------------------------------------------- Multi Wound Chart Details Patient Name: [name] A. Date of Service: 07/10/2020 9:15 AM Medical Record Number: [id number] Patient Account Number: [id number] Date of  Birth/Sex: [birth year] (86 y.o. M) Treating RN: Huel Coventry Primary Care Tobias Avitabile: [name] Other Clinician: Referring Josselyne Onofrio: [name] Treating Mikayla Chiusano/Extender: Linwood Dibbles, HOYT Weeks in Treatment: 2 Vital Signs Height(in): 71 Pulse(bpm): 62 Weight(lbs): 220 Blood Pressure(mmHg): 112/54 Body Mass Index(BMI): 31 Temperature(F): 97.8 Respiratory Rate(breaths/min): 18 Photos: Wound Location: Left, Medial Lower Leg Right, Anterior Lower Leg Right, Medial Lower Leg Wounding Event: Gradually Appeared Gradually Appeared Gradually Appeared Primary Etiology: Venous Leg Ulcer Venous Leg Ulcer  Venous Leg Ulcer Secondary Etiology: Lymphedema Lymphedema N/A Comorbid History: Cataracts, Sleep Apnea, Congestive Cataracts, Sleep Apnea, Congestive Cataracts, Sleep Apnea, Congestive Heart Failure, Coronary Artery Heart Failure, Coronary Artery Heart Failure, Coronary Artery Disease, Hypertension, Disease, Hypertension, Disease, Hypertension, Osteoarthritis Osteoarthritis Osteoarthritis Date Acquired: 02/24/2020 06/05/2020 06/26/2020 Weeks of Treatment: 2 2 1  Wound Status: Open Open Open Measurements L x W x D (cm) 2.4x5.5x0.2 1x0.9x0.1 1.1x0.3x0.1 Area (cm) : 10.367 0.707 0.259 Volume (cm) : 2.073 0.071 0.026 % Reduction in Area: -383.50% -2180.60% -451.10% % Reduction in Volume: -383.20% -2266.70% -420.00% Classification: Full Thickness Without Exposed Full Thickness Without Exposed Partial Thickness Support Structures Support Structures Exudate Amount: Medium Large Medium Exudate Type: Serosanguineous Serous Serous Exudate Color: red, brown amber amber Wound Margin: Flat and Intact Flat and Intact Flat and Intact Granulation Amount: Medium (34-66%) Medium (34-66%) Small (1-33%) Granulation Quality: Red Red Red Necrotic Amount: Medium (34-66%) Medium (34-66%) Small (1-33%) Exposed Structures: Fat Layer (Subcutaneous Tissue): Fat Layer (Subcutaneous Tissue): Fat Layer  (Subcutaneous Tissue): Yes Yes Yes Fascia: No Fascia: No Fascia: No Tendon: No Tendon: No Tendon: No Muscle: No Muscle: No Muscle: No Joint: No Joint: No Joint: No Bone: No Bone: No Bone: No Epithelialization: Small (1-33%) None Small (1-33%) Wound Number: 6 N/A N/A Photos: No Photos N/A N/A Wound Location: Left, Medial Lower Leg N/A N/A Wounding Event: Blister N/A N/A Primary Etiology: Venous Leg Ulcer N/A N/A Secondary Etiology: N/A N/A N/A Comorbid History: Cataracts, Sleep Apnea, Congestive N/A N/A Heart Failure, Coronary Artery Jividen, Zhyon A. ( ) Disease, Hypertension, Osteoarthritis Date Acquired: 06/30/2020 N/A N/A Weeks of Treatment: 0 N/A N/A Wound Status: Open N/A N/A Measurements L x W x D (cm) 3.5x7.7x0.1 N/A N/A Area (cm) : 21.166 N/A N/A Volume (cm) : 2.117 N/A N/A % Reduction in Area: N/A N/A N/A % Reduction in Volume: N/A N/A N/A Classification: Full Thickness Without Exposed N/A N/A Support Structures Exudate Amount: Medium N/A N/A Exudate Type: Serosanguineous N/A N/A Exudate Color: red, brown N/A N/A Wound Margin: Flat and Intact N/A N/A Granulation Amount: Large (67-100%) N/A N/A Granulation Quality: Red N/A N/A Necrotic Amount: Small (1-33%) N/A N/A Exposed Structures: Fat Layer (Subcutaneous Tissue): N/A N/A Yes Fascia: No Tendon: No Muscle: No Joint: No Bone: No Epithelialization: None N/A N/A Treatment Notes Electronic Signature(s) Signed: 07/10/2020 11:48:21 AM By: 07/12/2020 Entered By: Benna Dunks on 07/10/2020 10:26:51 07/12/2020 (Joellyn Quails) -------------------------------------------------------------------------------- Multi-Disciplinary Care Plan Details Patient Name: [id number] A. Date of Service: 07/10/2020 9:15 AM Medical Record Number: 07/12/2020 Patient Account Number: [id number] Date of Birth/Sex: 08-30-1931 (86 y.o. M) Treating RN: 03-22[birth year]1994 Primary Care Blimie Vaness: Huel Coventry  Other Clinician: Referring Cherysh Epperly: [name] Treating Alexiya Franqui/Extender: [name] in Treatment: 2 Active Inactive Electronic Signature(s) Signed: 08/23/2020 10:50:41 AM By: 08/25/2020, BSN, RN, CWS, [name] RN, BSN Previous Signature: 07/10/2020 11:48:21 AM Version By: 07/12/2020 Previous Signature: 07/10/2020 4:06:30 PM Version By: 07/12/2020, BSN, RN, CWS, [name] RN, BSN Entered By: Elliot Gurney, BSN, RN, CWS, [name] on 08/23/2020 10:50:41 08/25/2020 (Joellyn Quails) -------------------------------------------------------------------------------- Pain Assessment Details Patient Name: [id number] A. Date of Service: 07/10/2020 9:15 AM Medical Record Number: 07/12/2020 Patient Account Number: [id number] Date of Birth/Sex: 06/18/1931 (86 y.o. M) Treating RN: 03-22-1994 Primary Care Hadiyah Maricle: Huel Coventry Other Clinician: Referring Brittie Whisnant: [name] Treating Roshaunda Starkey/Extender: [name], HOYT Weeks in Treatment: 2 Active Problems Location of Pain Severity and Description of Pain Patient Has Paino Yes Site Locations Pain Location: Pain in Ulcers With Dressing Change: Yes  Duration of the Pain. Constant / Intermittento Constant Rate the pain. Current Pain Level: 10 Worst Pain Level: 10 Least Pain Level: 8 Tolerable Pain Level: 8 Character of Pain Describe the Pain: Burning, Sharp, Shooting, Stabbing, Throbbing Pain Management and Medication Current Pain Management: Medication: Yes How does your wound impact your activities of daily livingo Sleep: No Hobbies: Yes Appetite: No Electronic Signature(s) Signed: 07/10/2020 11:48:21 AM By: Benna Dunks Signed: 07/10/2020 4:06:30 PM By: Elliot Gurney, BSN, RN, CWS, [name] RN, BSN Entered By: Benna Dunks on 07/10/2020 10:05:28 Joellyn Quails ([id number]) -------------------------------------------------------------------------------- Patient/Caregiver Education Details Patient Name: [name] A. Date of Service:  07/10/2020 9:15 AM Medical Record Number: [id number] Patient Account Number: [id number] Date of Birth/Gender: 1930/12/11 (86 y.o. M) Treating RN: Rodell Perna Primary Care Physician: [name] Other Clinician: Referring Physician: [name] Treating Physician/Extender: Skeet Simmer in Treatment: 2 Education Assessment Education Provided To: Patient Education Topics Provided Wound/Skin Impairment: Handouts: Caring for Your Ulcer Methods: Demonstration, Explain/Verbal Responses: State content correctly Electronic Signature(s) Signed: 07/10/2020 3:21:14 PM By: Rodell Perna Entered By: Rodell Perna on 07/10/2020 11:13:41 Reust, Perkins A. ([id number]) -------------------------------------------------------------------------------- Wound Assessment Details Patient Name: [name] A. Date of Service: 07/10/2020 9:15 AM Medical Record Number: [id number] Patient Account Number: [id number] Date of Birth/Sex: [birth year] (86 y.o. M) Treating RN: Huel Coventry Primary Care Meka Lewan: [name] Other Clinician: Referring Marisa Hage: [name] Treating Sherell Christoffel/Extender: Linwood Dibbles, HOYT Weeks in Treatment: 2 Wound Status Wound Number: 3 Primary Venous Leg Ulcer Etiology: Wound Location: Left, Medial Lower Leg Secondary Lymphedema Wounding Event: Gradually Appeared Etiology: Date Acquired: 02/24/2020 Wound Status: Open Weeks Of Treatment: 2 Comorbid Cataracts, Sleep Apnea, Congestive Heart Failure, Clustered Wound: No History: Coronary Artery Disease, Hypertension, Osteoarthritis Photos Wound Measurements Length: (cm) 2.4 Width: (cm) 5.5 Depth: (cm) 0.2 Area: (cm) 10.367 Volume: (cm) 2.073 % Reduction in Area: -383.5% % Reduction in Volume: -383.2% Epithelialization: Small (1-33%) Wound Description Classification: Full Thickness Without Exposed Support Structu Wound Margin: Flat and Intact Exudate Amount: Medium Exudate Type: Serosanguineous Exudate  Color: red, brown res Foul Odor After Cleansing: No Slough/Fibrino Yes Wound Bed Granulation Amount: Medium (34-66%) Exposed Structure Granulation Quality: Red Fascia Exposed: No Necrotic Amount: Medium (34-66%) Fat Layer (Subcutaneous Tissue) Exposed: Yes Necrotic Quality: Adherent Slough Tendon Exposed: No Muscle Exposed: No Joint Exposed: No Bone Exposed: No Electronic Signature(s) Signed: 07/10/2020 11:48:21 AM By: Benna Dunks Signed: 07/10/2020 4:06:30 PM By: Elliot Gurney, BSN, RN, CWS, [name] RN, BSN Entered By: Benna Dunks on 07/10/2020 10:15:42 [name] A. ([id number]) -------------------------------------------------------------------------------- Wound Assessment Details Patient Name: [name] A. Date of Service: 07/10/2020 9:15 AM Medical Record Number: [id number] Patient Account Number: [id number] Date of Birth/Sex: 05/02/1931 (86 y.o. M) Treating RN: Huel Coventry Primary Care Makalya Nave: [name] Other Clinician: Referring Bonnie Overdorf: [name] Treating Brycen Bean/Extender: Linwood Dibbles, HOYT Weeks in Treatment: 2 Wound Status Wound Number: 4 Primary Venous Leg Ulcer Etiology: Wound Location: Right, Anterior Lower Leg Secondary Lymphedema Wounding Event: Gradually Appeared Etiology: Date Acquired: 06/05/2020 Wound Status: Open Weeks Of Treatment: 2 Comorbid Cataracts, Sleep Apnea, Congestive Heart Failure, Clustered Wound: No History: Coronary Artery Disease, Hypertension, Osteoarthritis Photos Wound Measurements Length: (cm) 1 Width: (cm) 0.9 Depth: (cm) 0.1 Area: (cm) 0.707 Volume: (cm) 0.071 % Reduction in Area: -2180.6% % Reduction in Volume: -2266.7% Epithelialization: None Wound Description Classification: Full Thickness Without Exposed Support Structu Wound Margin: Flat and Intact Exudate Amount: Large Exudate Type: Serous Exudate Color: amber res Foul Odor After Cleansing: No Slough/Fibrino Yes Wound Bed Granulation Amount:  Medium (34-66%) Exposed Structure Granulation Quality: Red Fascia Exposed: No Necrotic Amount: Medium (34-66%) Fat Layer (Subcutaneous Tissue) Exposed: Yes Necrotic Quality: Adherent Slough Tendon Exposed: No Muscle Exposed: No Joint Exposed: No Bone Exposed: No Electronic Signature(s) Signed: 07/10/2020 11:48:21 AM By: Benna DunksLineberry, Carol Signed: 07/10/2020 4:06:30 PM By: Elliot GurneyWoody, BSN, RN, CWS, [name] RN, BSN Entered By: Benna DunksLineberry, Carol on 07/10/2020 10:16:07 [name] HiltsSCHENK, Faolan A. ([id number]) -------------------------------------------------------------------------------- Wound Assessment Details Patient Name: [name] HiltsSCHENK, Maher A. Date of Service: 07/10/2020 9:15 AM Medical Record Number: [id number] Patient Account Number: [id number] Date of Birth/Sex: [birth year] (86 y.o. M) Treating RN: Huel CoventryWoody, [name] Primary Care Kenneth Lax: Marcelino DusterJOHNSTON, JOHN Other Clinician: Referring Azavier Creson: Marcelino DusterJOHNSTON, JOHN Treating Evaleigh Mccamy/Extender: Linwood DibblesSTONE III, HOYT Weeks in Treatment: 2 Wound Status Wound Number: 5 Primary Venous Leg Ulcer Etiology: Wound Location: Right, Medial Lower Leg Wound Open Wounding Event: Gradually Appeared Status: Date Acquired: 06/26/2020 Comorbid Cataracts, Sleep Apnea, Congestive Heart Failure, Weeks Of Treatment: 1 History: Coronary Artery Disease, Hypertension, Osteoarthritis Clustered Wound: No Photos Wound Measurements Length: (cm) 1.1 Width: (cm) 0.3 Depth: (cm) 0.1 Area: (cm) 0.259 Volume: (cm) 0.026 % Reduction in Area: -451.1% % Reduction in Volume: -420% Epithelialization: Small (1-33%) Wound Description Classification: Partial Thickness Wound Margin: Flat and Intact Exudate Amount: Medium Exudate Type: Serous Exudate Color: amber Foul Odor After Cleansing: No Slough/Fibrino No Wound Bed Granulation Amount: Small (1-33%) Exposed Structure Granulation Quality: Red Fascia Exposed: No Necrotic Amount: Small (1-33%) Fat Layer (Subcutaneous Tissue) Exposed: Yes Necrotic  Quality: Adherent Slough Tendon Exposed: No Muscle Exposed: No Joint Exposed: No Bone Exposed: No Electronic Signature(s) Signed: 07/10/2020 11:48:21 AM By: Benna DunksLineberry, Carol Signed: 07/10/2020 4:06:30 PM By: Elliot GurneyWoody, BSN, RN, CWS, [name] RN, BSN Entered By: Benna DunksLineberry, Carol on 07/10/2020 10:16:46 [name] HiltsSCHENK, Laquon A. ([id number]) -------------------------------------------------------------------------------- Wound Assessment Details Patient Name: [name] HiltsSCHENK, Matej A. Date of Service: 07/10/2020 9:15 AM Medical Record Number: [id number] Patient Account Number: [id number] Date of Birth/Sex: [birth year] (86 y.o. M) Treating RN: Huel CoventryWoody, [name] Primary Care Crystel Demarco: Marcelino DusterJOHNSTON, JOHN Other Clinician: Referring Belkys Henault: Marcelino DusterJOHNSTON, JOHN Treating Larrisha Babineau/Extender: Linwood DibblesSTONE III, HOYT Weeks in Treatment: 2 Wound Status Wound Number: 6 Primary Venous Leg Ulcer Etiology: Wound Location: Left, Medial Lower Leg Wound Open Wounding Event: Blister Status: Date Acquired: 06/30/2020 Comorbid Cataracts, Sleep Apnea, Congestive Heart Failure, Weeks Of Treatment: 0 History: Coronary Artery Disease, Hypertension, Osteoarthritis Clustered Wound: No Wound Measurements Length: (cm) 3.5 Width: (cm) 7.7 Depth: (cm) 0.1 Area: (cm) 21.166 Volume: (cm) 2.117 % Reduction in Area: % Reduction in Volume: Epithelialization: None Tunneling: No Undermining: No Wound Description Classification: Full Thickness Without Exposed Support Structu Wound Margin: Flat and Intact Exudate Amount: Medium Exudate Type: Serosanguineous Exudate Color: red, brown res Foul Odor After Cleansing: No Slough/Fibrino Yes Wound Bed Granulation Amount: Large (67-100%) Exposed Structure Granulation Quality: Red Fascia Exposed: No Necrotic Amount: Small (1-33%) Fat Layer (Subcutaneous Tissue) Exposed: Yes Necrotic Quality: Adherent Slough Tendon Exposed: No Muscle Exposed: No Joint Exposed: No Bone Exposed: No Electronic  Signature(s) Signed: 07/10/2020 11:48:21 AM By: Benna DunksLineberry, Carol Signed: 07/10/2020 4:06:30 PM By: Elliot GurneyWoody, BSN, RN, CWS, [name] RN, BSN Entered By: Benna DunksLineberry, Carol on 07/10/2020 10:24:14 [name] HiltsSCHENK, Lanier A. ([id number]) -------------------------------------------------------------------------------- Vitals Details Patient Name: [name] HiltsSCHENK, Bernerd A. Date of Service: 07/10/2020 9:15 AM Medical Record Number: [id number] Patient Account Number: [id number] Date of Birth/Sex: [birth year] (86 y.o. M) Treating RN: Huel CoventryWoody, [name] Primary Care Enyla Lisbon: Marcelino DusterJOHNSTON, JOHN Other Clinician: Referring Demauri Advincula: Marcelino DusterJOHNSTON, JOHN Treating Aleister Lady/Extender: Linwood DibblesSTONE III, HOYT Weeks in Treatment: 2 Vital Signs Time Taken: 09:30 Temperature (F): 97.8 Height (in): 71 Pulse (bpm): 62  Weight (lbs): 220 Respiratory Rate (breaths/min): 18 Body Mass Index (BMI): 30.7 Blood Pressure (mmHg): 112/54 Reference Range: 80 - 120 mg / dl Electronic Signature(s) Signed: 07/10/2020 11:48:21 AM By: Benna Dunks Entered By: Benna Dunks on 07/10/2020 10:02:32

## 2020-07-10 NOTE — ED Provider Notes (Signed)
[facility] Emergency Department Provider Note  Time seen: 10:51 PM  I have reviewed the triage vital signs and the nursing notes.   HISTORY  Chief Complaint Cellulitis  HPI [name] is a 86 y.o. male with a past medical history of CAD, gastric reflux, hypertension, peripheral edema presents to the emergency department for concerns of possible cellulitis.  According to the patient he is followed at wound clinic where they do wraps on his legs.  He states over the past 1 week his legs have become much more swollen despite taking his home Lasix/furosemide.  They have now become somewhat painful was seen today at wound clinic and sent to the emergency department given significant lower extremity weepage and concern for cellulitis.  Patient denies any fever.  Patient denies shortness of breath.   Past Medical History:  Diagnosis Date  . Anxiety 10/11  . Aortic stenosis    a. s/p mechcanical AVR, 2002; b. 10/2018 Echo: Triv AI, mean grad .  . Bradycardia    chronic, no symptoms 07/2010  . C. difficile colitis   . Carotid bruit    dopplers in past, no abnormalities  . Chronic diastolic CHF (congestive heart failure) (HCC)    a. Echo 03/2015: EF 60-65%, no RWMA, GR1DD, mild BAE, mild to mod MR, mod TR, PASP 65 mmHg; b. 01/2017 Echo: EF 55-60%, NRWMA, grade 1 diastolic dysfunction.  Normal functioning prosthetic aortic valve.  Mean gradient 50 mmHg.  Sev TR. PASP ; c. 10/2018 Echo: EF 55-60%, Triv AI, mod dil LA, mod-sev TR, PASP 35-40, mild to mod red RV fxn.  . Coronary artery disease    a. mild, cath, 08/2010; b. medically managed  . Decreased hearing    Right ear  . Depression   . Gastric ulcer   . GERD (gastroesophageal reflux disease)   . Hypertension    BP higher than usual 04/19/10; amlodipine increased by telephone  . Mod-Sev Tricuspid regurgitation    a. 10/2018 Echo: Mod-Sev TR, PASP 35-27mmHg.  Marland Kitchen RLS (restless legs syndrome) 08/23/2015   . S/P AVR    a. [employer]. mechanical 2002; b. echo 08/2010 EF 60%, trival AI, mild MR, AVR working well; c. on longterm warfarin tx  . SOB (shortness of breath) 10/11   08/2010,Episodes at 5 AM, eventually felt to be anxiety, after complete workup including catheterization, pt greatly improved with anxiety meds 11/11    Patient Active Problem List   Diagnosis Date Noted  . Cellulitis 07/10/2020  . 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 Varicose veins of leg with swelling, right 02/08/2020  . 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 Greater trochanteric pain syndrome 11/02/2019  . Lower limb ulcer, ankle, left, limited to breakdown of skin (HCC) 11/02/2019  . H/O atrial flutter 07/15/2018  . Obstructive sleep apnea 10/23/2017  . Chronic diastolic CHF (congestive heart failure) (HCC) 09/24/2017  . Lymphedema 09/24/2017  . Chronic hyponatremia 08/12/2017  . Encounter for anticoagulation discussion and counseling 02/24/2017  . Bilateral leg edema 09/25/2015  . RLS (restless legs syndrome) 08/23/2015  . C. difficile colitis 05/26/2015  . Blood loss anemia   . S/P AVR (aortic valve replacement)   . Anemia 04/02/2015  . GERD (gastroesophageal reflux disease)   . Bradycardia   . Depression   . Anxiety   . Hypertension   . Decreased hearing   . Coronary artery disease   . Aortic stenosis   .  Warfarin anticoagulation   . Carotid bruit     Past Surgical History:  Procedure Laterality Date  . CARDIAC CATHETERIZATION    . ESOPHAGOGASTRODUODENOSCOPY N/A 04/05/2015   Procedure: ESOPHAGOGASTRODUODENOSCOPY (EGD);  Surgeon: Scot Jun, MD;  Location: [facility] ENDOSCOPY;  Service: Endoscopy;  Laterality: N/A;  . ESOPHAGOGASTRODUODENOSCOPY N/A 04/17/2015   Procedure: ESOPHAGOGASTRODUODENOSCOPY (EGD);  Surgeon: Scot Jun, MD;  Location: [facility] ENDOSCOPY;  Service: Endoscopy;  Laterality: N/A;  . ESOPHAGOGASTRODUODENOSCOPY N/A 08/02/2015    Procedure: ESOPHAGOGASTRODUODENOSCOPY (EGD);  Surgeon: Wallace Cullens, MD;  Location: [facility] ENDOSCOPY;  Service: Endoscopy;  Laterality: N/A;  . HERNIA REPAIR    . JOINT REPLACEMENT    . TOTAL HIP ARTHROPLASTY    . VALVE REPLACEMENT  1/02   Aortic; echo 3/09 valve working well; echo 10/11 working well; put on Coumadin    Prior to Admission medications   Medication Sig Start Date End Date Taking? Authorizing Provider  allopurinol (ZYLOPRIM) 300 MG tablet One tab daily, 90 tabs 05/16/20   [provider]  amoxicillin (AMOXIL) 500 MG capsule 4 CAPSULES PRIOR TO DENTAL PROCEDURES AS DIRECTED 06/27/20   [provider]  cephALEXin (KEFLEX) 500 MG capsule Take 1 capsule (500 mg total) by mouth 3 (three) times daily. 04/20/20   Georgiana Spinner, NP  Cetirizine HCl 10 MG CAPS Take 10 mg by mouth daily.    [provider]  citalopram (CELEXA) 20 MG tablet Take 20 mg by mouth daily.    [provider]  cloNIDine (CATAPRES) 0.1 MG tablet TAKE 1 TABLET BY MOUTH  TWICE DAILY 01/05/20   Antonieta Iba, MD  doxazosin (CARDURA) 8 MG tablet Take 8 mg by mouth 2 (two) times daily.  02/09/20   [provider]  finasteride (PROSCAR) 5 MG tablet Take 5 mg by mouth daily.      [provider]  isosorbide mononitrate (IMDUR) 60 MG 24 hr tablet Take 1 tablet (60 mg total) by mouth 2 (two) times daily. 05/05/20   Creig Hines, NP  metolazone (ZAROXOLYN) 2.5 MG tablet Take 1 tablet (2.5 mg total) by mouth 2 (two) times a week. 02/17/20 07/03/20  Antonieta Iba, MD  metoprolol succinate (TOPROL-XL) 25 MG 24 hr tablet Take 0.5 tablets (12.5 mg total) by mouth daily. 03/06/20   Antonieta Iba, MD  potassium chloride (KLOR-CON) 10 MEQ tablet TAKE 3 TABLETS (30 MEQ) BY MOUTH DAILY. TAKE AN ADDITIONAL 2TABS ON THE DAYS YOU TAKE METOLAZONE 06/08/20   Gollan, Tollie Pizza, MD  rOPINIRole (REQUIP) 2 MG tablet TAKE 1 TABLET BY MOUTH 4  TIMES DAILY 09/14/18   Butch Penny, NP  Tamsulosin HCl (FLOMAX) 0.4 MG CAPS Take 0.4 mg by mouth at bedtime.     [provider]  torsemide (DEMADEX) 20 MG tablet TAKE 2 TABLETS BY MOUTH  TWICE DAILY 04/05/20   Antonieta Iba, MD  traMADol (ULTRAM) 50 MG tablet Take 50 mg by mouth 2 (two) times daily as needed. 11/02/19   [provider]  traZODone (DESYREL) 50 MG tablet Take 50 mg by mouth at bedtime.    [provider]  warfarin (COUMADIN) 5 MG tablet TAKE 1 TABLET BY MOUTH  DAILY OR AS DIRECTED BY THE ANTICOAGULATION CLINIC 06/19/20   Antonieta Iba, MD    Allergies  Allergen Reactions  . Sulfa Antibiotics Other (See Comments)    Reaction:  Unknown   . Levofloxacin Nausea Only    Family History  Problem Relation  Age of Onset  . Hypertension Mother   . Diabetes type II Mother   . Heart disease Mother   . Heart attack Mother   . Hypertension Father   . Heart disease Father   . Stroke Father   . Stroke Brother   . Stroke Brother   . Hypertension Other     Social History Social History   Tobacco Use  . Smoking status: Former Smoker    Types: Cigarettes    Quit date: 1979    Years since quitting: 42.6  . Smokeless tobacco: Never Used  Vaping Use  . Vaping Use: Never used  Substance Use Topics  . Alcohol use: Yes    Alcohol/week: 3.0 standard drinks    Types: 3 Glasses of wine per week  . Drug use: No    Review of Systems Constitutional: Negative for fever. Cardiovascular: Negative for chest pain. Respiratory: Negative for shortness of breath. Gastrointestinal: Negative for abdominal pain Musculoskeletal: Lower extremity peripheral edema with redness and tenderness. Skin: Weepage of the lower extremities Neurological: Negative for headache All other ROS negative  ____________________________________________   PHYSICAL EXAM:  VITAL SIGNS: ED Triage Vitals  Enc Vitals Group     BP 07/10/20 1159 125/65     Pulse Rate 07/10/20 1159 61     Resp 07/10/20  1159 17     Temp 07/10/20 1159 98.1 F (36.7 C)     Temp Source 07/10/20 1159 Oral     SpO2 07/10/20 1159 99 %     Weight 07/10/20 1200 222 lb 10.6 oz (101 kg)     Height 07/10/20 1200 5\' 11"  (1.803 m)     Head Circumference --      Peak Flow --      Pain Score 07/10/20 1200 0     Pain Loc --      Pain Edu? --      Excl. in GC? --    Constitutional: Alert and oriented. Well appearing and in no distress. Eyes: Normal exam ENT      Head: Normocephalic and atraumatic.      Mouth/Throat: Mucous membranes are moist. Cardiovascular: Normal rate, regular rhythm.  Respiratory: Normal respiratory effort without tachypnea nor retractions. Breath sounds are clear  Gastrointestinal: Soft and nontender. No distention. Musculoskeletal: Patient has 3-4+ lower extremity edema bilaterally with mild to moderate erythema and several areas of weepage bilaterally.  Mild to moderate tenderness to palpation. Neurologic:  Normal speech and language. No gross focal neurologic deficit Skin: Erythematous lower extremities bilaterally below the knees.  Weepage bilateral lower extremities. Psychiatric: Mood and affect are normal.     INITIAL IMPRESSION / ASSESSMENT AND PLAN / ED COURSE  Pertinent labs & imaging results that were available during my care of the patient were reviewed by me and considered in my medical decision making (see chart for details).   Patient presents emergency department with worsening lower extremity edema bilaterally now with significant weepage erythema and tenderness.  Clinical exam is most consistent with peripheral edema likely superinfected.  Given the significant weepage we will admit the patient to the hospital service for IV diuresis and cover with IV antibiotics for likely cellulitis.  Patient agreeable to plan of care.  We will check blood cultures.  Nathan Rushton Early was evaluated in Emergency Department on 07/10/2020 for the symptoms described in the history of present  illness. He was evaluated in the context of the global COVID-19 pandemic, which necessitated consideration that the patient might  be at risk for infection with the SARS-CoV-2 virus that causes COVID-19. Institutional protocols and algorithms that pertain to the evaluation of patients at risk for COVID-19 are in a state of rapid change based on information released by regulatory bodies including the CDC and federal and state organizations. These policies and algorithms were followed during the patient's care in the ED.  ____________________________________________   FINAL CLINICAL IMPRESSION(S) / ED DIAGNOSES  Cellulitis Peripheral edema   Minna AntisPaduchowski, Haydon Dorris, MD 07/10/20 2254

## 2020-07-10 NOTE — ED Triage Notes (Signed)
Pt in via POV, sent over from Wound Clinic due to ongoing and worsening cellulitis of BLE's.  Denies any current antibiotic therapy.  BLE's wrapped in gauze upon arrival.  Vitals WDL, NAD noted.

## 2020-07-10 NOTE — Progress Notes (Addendum)
EDSEL, SHIVES ([id number]) Visit Report for 07/10/2020 Chief Complaint Document Details Patient Name: [name], [name]. Date of Service: 07/10/2020 9:15 AM Medical Record Number: [id number] Patient Account Number: [id number] Date of Birth/Sex: 1931-05-25 (86 y.o. M) Treating RN: Huel Coventry Primary Care Provider: [name] Other Clinician: Referring Provider: [name] Treating Provider/Extender: Linwood Dibbles, Nephi Savage Weeks in Treatment: 2 Information Obtained from: Patient Chief Complaint 04/21/2019; patient is here for review of wounds x2 on his right lower extremity. Electronic Signature(s) Signed: 07/10/2020 10:09:41 AM By: Lenda Kelp PA-C Entered By: Lenda Kelp on 07/10/2020 10:09:40 Joellyn Quails ([id number]) -------------------------------------------------------------------------------- HPI Details Patient Name: [name] Hilts [name]. Date of Service: 07/10/2020 9:15 AM Medical Record Number: [id number] Patient Account Number: [id number] Date of Birth/Sex: 01-03-1931 (86 y.o. M) Treating RN: Huel Coventry Primary Care Provider: [name] Other Clinician: Referring Provider: [name] Treating Provider/Extender: Linwood Dibbles, Jem Castro Weeks in Treatment: 2 History of Present Illness HPI Description: 04/21/2019 ADMISSION This is an independent 86 year old man who lives in the independent part of [address] [place]. He has [name] history of chronic lower extremity edema and wears compression stockings. He states about [name] month ago he was taking off the one on the right and pulled some skin off accidentally. He has had 2 small wounds on the right anterior and right medial lower extremity. I note looking through [facility] health link that he had multiple venous ultrasounds in 2015 and 16. These were DVT rule outs. He did have [name] Baker's cyst on the left. He has not not had [name] previous wound history although he did have [name] history of cellulitis in his legs. Past medical history;  includes aortic stenosis status post mechanical AVR in 2007 on chronic Coumadin, lower extremity edema with [name] history of cellulitis, history of MRSA. ABIs in our clinic were 1.1 on the right 6/3; patient with predominantly venous insufficiency ulcers in the right lower leg probably some degree of lymphedema. He had to wounds last week. We put him in 3 layer compression. The nurses at [facility] are changing the dressing. The area laterally is healed but he still has [name] small very painful area on the anterior tibial area. He is on Coumadin because of mechanical aortic valve. 6/10; venous insufficiency ulcers on the right lower leg. He has some degree of lymphedema. We have been using 3 layer compression with silver collagen small wound. Dimensions are improved. I gave him doxycycline last week which he tolerated because of surrounding erythema. This is improved also 6/17; arrives in clinic today with portable silver collagen dressing tightly adherent to the wound. Also felt that the wrap was too tight 3 layer being changed at the [facility] 6/24; patient's wound is small still adherent debris over the surface however even under illumination it was hard to see what was still open here. We have been using silver collagen 7/1; the patient arrives in the clinic today and the area on the right lower leg is healed. He has severe chronic venous insufficiency. He has 20/30 compression stockings. Readmission: 06/22/2020 on evaluation today patient presents for reevaluation here in our clinic although it has been [name] little over [name] year since we last saw him. He does have [name] history of lymphedema, venous insufficiency, anticoagulant therapy, he is on [name] pacemaker which is the reason for the anticoagulants, hypertension, and congestive heart failure. With that being said unfortunately he has been dealing with [name] wound of the left and right legs which has  been giving him some trouble since arrival  first 2021. That is in regard to the left leg ulcer. In regard to the right leg ulcer this is just [name] very small area that really I think will seal up quite nicely is more of the lymphedema opening than anything else. With that being said unfortunately the left leg is quite significant. We actually have noted that the patient's been dealing with this for quite some time and I think that there is [name] chance we may want to consider biopsy. With that being said he is on Coumadin therefore we were not able to perform [name] biopsy at this point. I think that something however in the future that may be warranted we may just have to take him off of the current Coumadin regimen in order to do this. We discussed doing it today but I am more concerned about the fact that he could have issues with uncontrolled bleeding and in that case we may have to send him to the ER. His daughter was not wanting to take that chance which I can completely understand. Nonetheless so far they have been using antibiotic ointment over the area I am just putting [name] protective dressing I do believe the patient is that he needs some type of compression. 07/07/2020 on evaluation today patient appears to be doing poorly in regard to his bilateral lower extremities today. He actually went to urgent care last night due to having pain in his legs due to the wraps. It appears they got somewhat tight on him because of the fact that he is having such significant swelling. He appears to be fluid overloaded I do not see any signs of infection actively at this point which is good news but unfortunately I think that this is something that may need to be addressed even by cardiology more so than just with [name] different or alternate type of wrap. I will contact his cardiologist, Dr. Mariah Milling, as well. 07/10/2020 upon evaluation today patient unfortunately appears to be doing worse even than when I saw him on Friday from the standpoint of where his legs stand.  The main issue is that he has increased erythema although there is still not warm to touch I am concerned about infection and about this getting significantly worse. Again my initial concern was more fluid overload and I discussed this with his cardiology office on Friday. With that being said I feel like this has gotten worse his daughter is in agreement and I think that the best option would be to send him to the hospital at this point. Electronic Signature(s) Signed: 07/10/2020 11:17:02 AM By: Lenda Kelp PA-C Entered By: Lenda Kelp on 07/10/2020 11:17:01 Joellyn Quails ([id number]) -------------------------------------------------------------------------------- Physical Exam Details Patient Name: [name] Hilts [name]. Date of Service: 07/10/2020 9:15 AM Medical Record Number: [id number] Patient Account Number: [id number] Date of Birth/Sex: 1931/11/06 (86 y.o. M) Treating RN: Huel Coventry Primary Care Provider: [name] Other Clinician: Referring Provider: [name] Treating Provider/Extender: Linwood Dibbles, Garrus Gauthreaux Weeks in Treatment: 2 Constitutional Well-nourished and well-hydrated in no acute distress. Respiratory normal breathing without difficulty. Psychiatric this patient is able to make decisions and demonstrates good insight into disease process. Alert and Oriented x 3. pleasant and cooperative. Notes Upon inspection patient's wound bed actually showed signs of poor healing and still [name] tremendous amount of fluid and drainage. He filled up to [employer] which is quite significant at this point. Nonetheless I think that he really needs to  be in the hospital where they can try to manage this more acutely as to be honest from an outpatient standpoint I do not think that working to be able to control this drainage. Also concerned about infection. Electronic Signature(s) Signed: 07/10/2020 11:17:34 AM By: Lenda Kelp PA-C Entered By: Lenda Kelp on 07/10/2020  11:17:33 Joellyn Quails ([id number]) -------------------------------------------------------------------------------- Physician Orders Details Patient Name: [name] Hilts [name]. Date of Service: 07/10/2020 9:15 AM Medical Record Number: [id number] Patient Account Number: [id number] Date of Birth/Sex: 1931/11/10 (86 y.o. M) Treating RN: Rodell Perna Primary Care Provider: [name] Other Clinician: Referring Provider: [name] Treating Provider/Extender: Linwood Dibbles, Dontavius Keim Weeks in Treatment: 2 Verbal / Phone Orders: No Diagnosis Coding ICD-10 Coding Code Description I89.0 Lymphedema, not elsewhere classified I87.2 Venous insufficiency (chronic) (peripheral) L97.822 Non-pressure chronic ulcer of other part of left lower leg with fat layer exposed L97.812 Non-pressure chronic ulcer of other part of right lower leg with fat layer exposed Z79.01 Long term (current) use of anticoagulants Z95.0 Presence of cardiac pacemaker I10 Essential (primary) hypertension I50.42 Chronic combined systolic (congestive) and diastolic (congestive) heart failure I25.10 Atherosclerotic heart disease of native coronary artery without angina pectoris Secondary Dressing Wound #3 Left,Medial Lower Leg o ABD and Kerlix/Conform Wound #4 Right,Anterior Lower Leg o ABD and Kerlix/Conform Wound #5 Right,Medial Lower Leg o ABD and Kerlix/Conform Wound #6 Left,Medial Lower Leg o ABD and Kerlix/Conform Follow-up Appointments Wound #3 Left,Medial Lower Leg o Other: - Pt sent to ED follow up as needed once discharged Wound #4 Right,Anterior Lower Leg o Other: - Pt sent to ED follow up as needed once discharged Wound #5 Right,Medial Lower Leg o Other: - Pt sent to ED follow up as needed once discharged Wound #6 Left,Medial Lower Leg o Other: - Pt sent to ED follow up as needed once discharged Electronic Signature(s) Signed: 07/10/2020 11:11:51 AM By: Rodell Perna Signed: 07/11/2020 4:23:25  PM By: Lenda Kelp PA-C Entered By: Rodell Perna on 07/10/2020 11:11:51 Dickerman, Oluwademilade [name]. ([id number]) -------------------------------------------------------------------------------- Problem List Details Patient Name: [name] Hilts [name]. Date of Service: 07/10/2020 9:15 AM Medical Record Number: [id number] Patient Account Number: [id number] Date of Birth/Sex: [birth year] (86 y.o. M) Treating RN: Huel Coventry Primary Care Provider: [name] Other Clinician: Referring Provider: [name] Treating Provider/Extender: Linwood Dibbles, Zelene Barga Weeks in Treatment: 2 Active Problems ICD-10 Encounter Code Description Active Date MDM Diagnosis I89.0 Lymphedema, not elsewhere classified 06/22/2020 No Yes I87.2 Venous insufficiency (chronic) (peripheral) 06/22/2020 No Yes L97.822 Non-pressure chronic ulcer of other part of left lower leg with fat layer 06/22/2020 No Yes exposed L97.812 Non-pressure chronic ulcer of other part of right lower leg with fat layer 06/22/2020 No Yes exposed Z79.01 Long term (current) use of anticoagulants 06/22/2020 No Yes Z95.0 Presence of cardiac pacemaker 06/22/2020 No Yes I10 Essential (primary) hypertension 06/22/2020 No Yes I50.42 Chronic combined systolic (congestive) and diastolic (congestive) heart 06/22/2020 No Yes failure I25.10 Atherosclerotic heart disease of native coronary artery without angina 06/22/2020 No Yes pectoris Inactive Problems Resolved Problems Electronic Signature(s) Signed: 07/10/2020 10:09:35 AM By: Lenda Kelp PA-C Entered By: Lenda Kelp on 07/10/2020 10:09:34 [name] Hilts [name]. ([id number]) -------------------------------------------------------------------------------- Progress Note Details Patient Name: [name] Hilts [name]. Date of Service: 07/10/2020 9:15 AM Medical Record Number: [id number] Patient Account Number: [id number] Date of Birth/Sex: 23-Nov-1931 (86 y.o. M) Treating RN: Huel Coventry Primary Care Provider: [name]  Other Clinician: Referring Provider: [name] Treating Provider/Extender: Linwood Dibbles, Bryceton Hantz Weeks in Treatment: 2 Subjective Chief Complaint Information  obtained from Patient 04/21/2019; patient is here for review of wounds x2 on his right lower extremity. History of Present Illness (HPI) 04/21/2019 ADMISSION This is an independent 86 year old man who lives in the independent part of [address] of [place]. He has [name] history of chronic lower extremity edema and wears compression stockings. He states about [name] month ago he was taking off the one on the right and pulled some skin off accidentally. He has had 2 small wounds on the right anterior and right medial lower extremity. I note looking through [facility] health link that he had multiple venous ultrasounds in 2015 and 16. These were DVT rule outs. He did have [name] Baker's cyst on the left. He has not not had [name] previous wound history although he did have [name] history of cellulitis in his legs. Past medical history; includes aortic stenosis status post mechanical AVR in 2007 on chronic Coumadin, lower extremity edema with [name] history of cellulitis, history of MRSA. ABIs in our clinic were 1.1 on the right 6/3; patient with predominantly venous insufficiency ulcers in the right lower leg probably some degree of lymphedema. He had to wounds last week. We put him in 3 layer compression. The nurses at [facility] are changing the dressing. The area laterally is healed but he still has [name] small very painful area on the anterior tibial area. He is on Coumadin because of mechanical aortic valve. 6/10; venous insufficiency ulcers on the right lower leg. He has some degree of lymphedema. We have been using 3 layer compression with silver collagen small wound. Dimensions are improved. I gave him doxycycline last week which he tolerated because of surrounding erythema. This is improved also 6/17; arrives in clinic today with portable silver collagen dressing  tightly adherent to the wound. Also felt that the wrap was too tight 3 layer being changed at the [facility] 6/24; patient's wound is small still adherent debris over the surface however even under illumination it was hard to see what was still open here. We have been using silver collagen 7/1; the patient arrives in the clinic today and the area on the right lower leg is healed. He has severe chronic venous insufficiency. He has 20/30 compression stockings. Readmission: 06/22/2020 on evaluation today patient presents for reevaluation here in our clinic although it has been [name] little over [name] year since we last saw him. He does have [name] history of lymphedema, venous insufficiency, anticoagulant therapy, he is on [name] pacemaker which is the reason for the anticoagulants, hypertension, and congestive heart failure. With that being said unfortunately he has been dealing with [name] wound of the left and right legs which has been giving him some trouble since arrival first 2021. That is in regard to the left leg ulcer. In regard to the right leg ulcer this is just [name] very small area that really I think will seal up quite nicely is more of the lymphedema opening than anything else. With that being said unfortunately the left leg is quite significant. We actually have noted that the patient's been dealing with this for quite some time and I think that there is [name] chance we may want to consider biopsy. With that being said he is on Coumadin therefore we were not able to perform [name] biopsy at this point. I think that something however in the future that may be warranted we may just have to take him off of the current Coumadin regimen in order to do this. We discussed  doing it today but I am more concerned about the fact that he could have issues with uncontrolled bleeding and in that case we may have to send him to the ER. His daughter was not wanting to take that chance which I can completely  understand. Nonetheless so far they have been using antibiotic ointment over the area I am just putting [name] protective dressing I do believe the patient is that he needs some type of compression. 07/07/2020 on evaluation today patient appears to be doing poorly in regard to his bilateral lower extremities today. He actually went to urgent care last night due to having pain in his legs due to the wraps. It appears they got somewhat tight on him because of the fact that he is having such significant swelling. He appears to be fluid overloaded I do not see any signs of infection actively at this point which is good news but unfortunately I think that this is something that may need to be addressed even by cardiology more so than just with [name] different or alternate type of wrap. I will contact his cardiologist, Dr. Mariah MillingGollan, as well. 07/10/2020 upon evaluation today patient unfortunately appears to be doing worse even than when I saw him on Friday from the standpoint of where his legs stand. The main issue is that he has increased erythema although there is still not warm to touch I am concerned about infection and about this getting significantly worse. Again my initial concern was more fluid overload and I discussed this with his cardiology office on Friday. With that being said I feel like this has gotten worse his daughter is in agreement and I think that the best option would be to send him to the hospital at this point. Cravey, Kevon [name]. ([id number]) Objective Constitutional Well-nourished and well-hydrated in no acute distress. Vitals Time Taken: 9:30 AM, Height: 71 in, Weight: 220 lbs, BMI: 30.7, Temperature: 97.8 F, Pulse: 62 bpm, Respiratory Rate: 18 breaths/min, Blood Pressure: 112/54 mmHg. Respiratory normal breathing without difficulty. Psychiatric this patient is able to make decisions and demonstrates good insight into disease process. Alert and Oriented x 3. pleasant and  cooperative. General Notes: Upon inspection patient's wound bed actually showed signs of poor healing and still [name] tremendous amount of fluid and drainage. He filled up to [employer] pads which is quite significant at this point. Nonetheless I think that he really needs to be in the hospital where they can try to manage this more acutely as to be honest from an outpatient standpoint I do not think that working to be able to control this drainage. Also concerned about infection. Integumentary (Hair, Skin) Wound #3 status is Open. Original cause of wound was Gradually Appeared. The wound is located on the Left,Medial Lower Leg. The wound measures 2.4cm length x 5.5cm width x 0.2cm depth; 10.367cm^2 area and 2.073cm^3 volume. There is Fat Layer (Subcutaneous Tissue) Exposed exposed. There is [name] medium amount of serosanguineous drainage noted. The wound margin is flat and intact. There is medium (34-66%) red granulation within the wound bed. There is [name] medium (34-66%) amount of necrotic tissue within the wound bed including Adherent Slough. Wound #4 status is Open. Original cause of wound was Gradually Appeared. The wound is located on the Right,Anterior Lower Leg. The wound measures 1cm length x 0.9cm width x 0.1cm depth; 0.707cm^2 area and 0.071cm^3 volume. There is Fat Layer (Subcutaneous Tissue) Exposed exposed. There is [name] large amount of serous drainage noted. The wound margin is  flat and intact. There is medium (34-66%) red granulation within the wound bed. There is [name] medium (34-66%) amount of necrotic tissue within the wound bed including Adherent Slough. Wound #5 status is Open. Original cause of wound was Gradually Appeared. The wound is located on the Right,Medial Lower Leg. The wound measures 1.1cm length x 0.3cm width x 0.1cm depth; 0.259cm^2 area and 0.026cm^3 volume. There is Fat Layer (Subcutaneous Tissue) Exposed exposed. There is [name] medium amount of serous drainage noted. The wound margin is  flat and intact. There is small (1-33%) red granulation within the wound bed. There is [name] small (1-33%) amount of necrotic tissue within the wound bed including Adherent Slough. Wound #6 status is Open. Original cause of wound was Blister. The wound is located on the Left,Medial Lower Leg. The wound measures 3.5cm length x 7.7cm width x 0.1cm depth; 21.166cm^2 area and 2.117cm^3 volume. There is Fat Layer (Subcutaneous Tissue) Exposed exposed. There is no tunneling or undermining noted. There is [name] medium amount of serosanguineous drainage noted. The wound margin is flat and intact. There is large (67-100%) red granulation within the wound bed. There is [name] small (1-33%) amount of necrotic tissue within the wound bed including Adherent Slough. Assessment Active Problems ICD-10 Lymphedema, not elsewhere classified Venous insufficiency (chronic) (peripheral) Non-pressure chronic ulcer of other part of left lower leg with fat layer exposed Non-pressure chronic ulcer of other part of right lower leg with fat layer exposed Long term (current) use of anticoagulants Presence of cardiac pacemaker Essential (primary) hypertension Chronic combined systolic (congestive) and diastolic (congestive) heart failure Atherosclerotic heart disease of native coronary artery without angina pectoris Plan Secondary Dressing: Wound #3 Left,Medial Lower Leg: ABD and Kerlix/Conform Wound #4 Right,Anterior Lower Leg: Quarry, Donnivan [name]. ([id number]) ABD and Kerlix/Conform Wound #5 Right,Medial Lower Leg: ABD and Kerlix/Conform Wound #6 Left,Medial Lower Leg: ABD and Kerlix/Conform Follow-up Appointments: Wound #3 Left,Medial Lower Leg: Other: - Pt sent to ED follow up as needed once discharged Wound #4 Right,Anterior Lower Leg: Other: - Pt sent to ED follow up as needed once discharged Wound #5 Right,Medial Lower Leg: Other: - Pt sent to ED follow up as needed once discharged Wound #6 Left,Medial Lower  Leg: Other: - Pt sent to ED follow up as needed once discharged 1. I would recommend at this time that we go ahead and have the patient go to the ER for further evaluation and treatment. I think that he desperately needs to get some of the fluid and edema on his legs but I am also concerned about infection with erythema at this point this looks [name] lot worse than it did on Friday. 2. With regard to the dressings to get him over to the hospital he will use just ABD pads and roll gauze that she just to get to the ED. 3. With regard to the elevation the patient's legs really do need to be elevated as much as possible but again at the hospital they will be able to manage this more acutely as far as the amount of drainage he has is concerned. We will see back for follow-up visit as needed with regard to follow-up whenever he gets out of the hospital. Electronic Signature(s) Signed: 07/10/2020 11:19:03 AM By: Lenda Kelp PA-C Entered By: Lenda Kelp on 07/10/2020 11:19:03 [name], Tijuan [name]. ([id number]) -------------------------------------------------------------------------------- SuperBill Details Patient Name: [name] Hilts [name]. Date of Service: 07/10/2020 Medical Record Number: [id number] Patient Account Number: [id number] Date of Birth/Sex: 03-09-1931 (86 y.o. M) Treating  RN: Huel Coventry Primary Care Provider: [name] Other Clinician: Referring Provider: [name] Treating Provider/Extender: Linwood Dibbles, Keatin Benham Weeks in Treatment: 2 Diagnosis Coding ICD-10 Codes Code Description I89.0 Lymphedema, not elsewhere classified I87.2 Venous insufficiency (chronic) (peripheral) L97.822 Non-pressure chronic ulcer of other part of left lower leg with fat layer exposed L97.812 Non-pressure chronic ulcer of other part of right lower leg with fat layer exposed Z79.01 Long term (current) use of anticoagulants Z95.0 Presence of cardiac pacemaker I10 Essential (primary) hypertension I50.42  Chronic combined systolic (congestive) and diastolic (congestive) heart failure I25.10 Atherosclerotic heart disease of native coronary artery without angina pectoris Facility Procedures CPT4 Code: [id number] Description: 33825 - WOUND CARE VISIT-LEV 5 EST PT Modifier: Quantity: 1 Physician Procedures CPT4 Code: 0539767 Description: 99214 - WC PHYS LEVEL 4 - EST PT Modifier: Quantity: 1 CPT4 Code: Description: ICD-10 Diagnosis Description I89.0 Lymphedema, not elsewhere classified I87.2 Venous insufficiency (chronic) (peripheral) L97.822 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

## 2020-07-10 NOTE — Progress Notes (Deleted)
Office Visit    Patient Name: [name] Date of Encounter: 07/10/2020  Primary Care Provider:  Gracelyn Nurse, MD Primary Cardiologist:  Julien Nordmann, MD Electrophysiologist:  None   Chief Complaint    [name] is a 86 y.o. male with a hx of permanent atrial fibrillation, aortic stenosis s/p mechanical AVR on chronic Coumadin, chronic diastolic heart failure, nonobstructive coronary artery disease, HTN, HLD, GERD, chronic dyspnea, chronic venous stasis presents today for HFpEF follow up.   Past Medical History    Past Medical History:  Diagnosis Date  . Anxiety 10/11  . Aortic stenosis    a. s/p mechcanical AVR, 2002; b. 10/2018 Echo: Triv AI, mean grad .  . Bradycardia    chronic, no symptoms 07/2010  . C. difficile colitis   . Carotid bruit    dopplers in past, no abnormalities  . Chronic diastolic CHF (congestive heart failure) (HCC)    a. Echo 03/2015: EF 60-65%, no RWMA, GR1DD, mild BAE, mild to mod MR, mod TR, PASP 65 mmHg; b. 01/2017 Echo: EF 55-60%, NRWMA, grade 1 diastolic dysfunction.  Normal functioning prosthetic aortic valve.  Mean gradient 50 mmHg.  Sev TR. PASP ; c. 10/2018 Echo: EF 55-60%, Triv AI, mod dil LA, mod-sev TR, PASP 35-40, mild to mod red RV fxn.  . Coronary artery disease    a. mild, cath, 08/2010; b. medically managed  . Decreased hearing    Right ear  . Depression   . Gastric ulcer   . GERD (gastroesophageal reflux disease)   . Hypertension    BP higher than usual 04/19/10; amlodipine increased by telephone  . Mod-Sev Tricuspid regurgitation    a. 10/2018 Echo: Mod-Sev TR, PASP 35-48mmHg.  Marland Kitchen RLS (restless legs syndrome) 08/23/2015  . S/P AVR    a. St. Jude. mechanical 2002; b. echo 08/2010 EF 60%, trival AI, mild MR, AVR working well; c. on longterm warfarin tx  . SOB (shortness of breath) 10/11   08/2010,Episodes at 5 AM, eventually felt to be anxiety, after complete workup including catheterization, pt  greatly improved with anxiety meds 11/11   Past Surgical History:  Procedure Laterality Date  . CARDIAC CATHETERIZATION    . ESOPHAGOGASTRODUODENOSCOPY N/A 04/05/2015   Procedure: ESOPHAGOGASTRODUODENOSCOPY (EGD);  Surgeon: Scot Jun, MD;  Location: [facility] ENDOSCOPY;  Service: Endoscopy;  Laterality: N/A;  . ESOPHAGOGASTRODUODENOSCOPY N/A 04/17/2015   Procedure: ESOPHAGOGASTRODUODENOSCOPY (EGD);  Surgeon: Scot Jun, MD;  Location: [facility] ENDOSCOPY;  Service: Endoscopy;  Laterality: N/A;  . ESOPHAGOGASTRODUODENOSCOPY N/A 08/02/2015   Procedure: ESOPHAGOGASTRODUODENOSCOPY (EGD);  Surgeon: Wallace Cullens, MD;  Location: [facility] ENDOSCOPY;  Service: Endoscopy;  Laterality: N/A;  . HERNIA REPAIR    . JOINT REPLACEMENT    . TOTAL HIP ARTHROPLASTY    . VALVE REPLACEMENT  1/02   Aortic; echo 3/09 valve working well; echo 10/11 working well; put on Coumadin    Allergies  Allergies  Allergen Reactions  . Sulfa Antibiotics Other (See Comments)    Reaction:  Unknown   . Levofloxacin Nausea Only    History of Present Illness    [name] is a 86 y.o. male with a hx of permanent atrial fibrillation, aortic stenosis s/p mechanical AVR on chronic Coumadin, chronic diastolic heart failure, nonobstructive coronary artery disease, HTN, HLD, GERD, chronic dyspnea, chronic venous stasis.  He was last seen  07/03/20.  07/2019 his torsemide was adjusted to 40 mg twice daily and metolazone added 3 times per  week.  Seen November 2020 with weight down to 206 pounds and present diuretic doses were maintained.  Seen 12/2018 and was stable chronic dyspnea on exertion.  Weight trending 195 pounds on home scale.  He was 208.4 pounds on clinic scale.  Using metolazone once per week.  He was recommended to increase to metolazone twice per week.  Seen 02/16/2020 with weight stable on present diuretic regimen.  He was recommended to continue torsemide 40 mg twice daily metolazone 2.5 mg twice per week.  On  July 21 of this year he called the office to report weight of 205 pounds and "weeping" of his lower extremities. Weight up to 220 lbs by office notes. Recommended by Dr. Mariah Milling for Metolazone 3 days in a row with Torsemide 40mg  BID and extra potassium each day.  Repeat BMP with elevated BUN and creatinine. Recommended to hold diuretics for 2 days.   Seen in clinic 06/23/2020 reported continuing lower extremity edema and abdominal distention.  Noted leg cramping.  He had been holding his fluid pill for 1 day. He was recommended to resume previous doses of Torsemide 40mg  BID and Metolazone three times per week. However he was also noted to have a supratherapeutic INR and sent to ED for evaluation.  Chest x-ray 06/26/2020 with mild central pulmonary vascular congestion with bibasilar pulmonary edema.  Lives at [place] [facility] [place].  He follows with vascular as well as wound care.  Date Weight  07/03/20 224 lb  06/23/20 221 lb  02/16/20 206 lb   Seen 07/03/20 with continued LE edema. Weight was up 4lbs from clinic visit 1 week prior. He was sent to short stay for 80mg  IV Lasix and 40 mEq PO potassium.  Repeat lab work 07/06/2020 with K3, creatinine 1.48, GFR 42.  Renal function overall stable.  However, he was recommended for an extra 40 mEq of p.o. potassium.  Date Diuretic  07/03/20 Torsemide 40mg  BID. 80mg  IV Lasix.  07/04/20 Torsemide 40mg  BID.  07/05/20 Torsemide 40mg  BID. Metolazone 2.5mg .  07/06/20 Torsemide 40mg  BID.  07/07/20 Torsemide 40mg  BID.  07/08/20 Torsemide 40mg  BID. Metolazone 2.5mg .  07/09/20 Torsemide 40mg  BID.   ***   EKGs/Labs/Other Studies Reviewed:   The following studies were reviewed today:  Echo 01/2020  1. Left ventricular ejection fraction, by estimation, is 55 to 60%. The  left ventricle has normal function. The left ventricle has no regional  wall motion abnormalities. Left ventricular diastolic function could not  be evaluated.   2. Right ventricular systolic function is  normal. The right ventricular  size is mildly enlarged.   3. Left atrial size was severely dilated.   4. Right atrial size was severely dilated.   5. The mitral valve is degenerative. Mild mitral valve regurgitation.   6. Tricuspid valve regurgitation is moderate to severe.   7. The aortic valve has been repaired/replaced. Aortic valve  regurgitation is not visualized. There is a mechanical valve present in  the aortic position. Procedure Date: 2002. Echo findings are consistent  with normal structure and function of the aortic   valve prosthesis.    EKG:  EKG is ordered today.  The ekg ordered today demonstrates rate controlled atrial fibrillation 60 bpm with known RBBB.  No acute ST/T wave changes.***  Recent Labs: 06/23/2020: Magnesium 2.4 06/26/2020: Hemoglobin 10.8; Platelets 288 06/28/2020: ALT 12 07/06/2020: BUN 43; Creatinine, Ser 1.48; Potassium 3.0; Sodium 133  Recent Lipid Panel No results found for: CHOL, TRIG, HDL, CHOLHDL, VLDL, LDLCALC, LDLDIRECT  Home Medications   No outpatient medications have been marked as taking for the 07/10/20 encounter (Appointment) with Alver Sorrow, NP.    Review of Systems   *** Review of Systems  Constitutional: Positive for malaise/fatigue. Negative for chills and fever.  Cardiovascular: Positive for dyspnea on exertion and leg swelling. Negative for chest pain, irregular heartbeat, near-syncope, orthopnea, palpitations and syncope.  Respiratory: Negative for cough, shortness of breath and wheezing.   Gastrointestinal: Negative for melena, nausea and vomiting.  Genitourinary: Negative for hematuria.  Neurological: Negative for dizziness, light-headedness and weakness.   All other systems reviewed and are otherwise negative except as noted above.  Physical Exam   *** VS:  There were no vitals taken for this visit. , BMI There is no height or weight on file to calculate BMI. GEN: Well nourished, well developed, in no acute  distress. HEENT: normal. Neck: Supple, no JVD, carotid bruits, or masses. Cardiac: irregularly irregular, no murmurs, rubs, or gallops. No clubbing, cyanosis. 2+ pitting edema to the top of his Unna boot..  Radials2+ and equal bilaterally.  Respiratory:  Respirations regular and unlabored, clear to auscultation bilaterally. GI: Soft, nontender, nondistended, BS + x 4. MS: No deformity or atrophy. Skin: Warm and dry, no rash. Unna boot to bilateral LE Neuro:  Strength and sensation are intact. Psych: Normal affect.  Assessment & Plan   *** 1. Chronic diastolic heart failure/CKDIII -Echo 02/11/2020 with EF 55 to 60%.  Weight up 4 pounds from clinic visit 1 week ago.  Titration of diuretics has been difficult in the setting of acute kidney injury likely cardiorenal syndrome.  Sent to short stay today for 80 mg of IV Lasix with 40 mEq of potassium.  Continue torsemide 40 mg twice daily.  He will resume his metolazone 2-3 times per week on Wednesday.  He will call our office Wednesday to report his weight.  BMP Thursday.  His weight today in clinic was 224 pounds and his approximate dry weight is 206 pounds.  Careful titration of diuretics in the setting of kidney disease.  Most recent lab work 06/28/2020 with creatinine 1.4, GFR 45, BUN of 43.   2. HTN - BP well controlled. Continue current antihypertensive regimen.   3. Nonobstructive CAD -stable with no anginal symptoms.  EKG today with no acute ST/T wave changes.  No indication for ischemic evaluation this time.  Continue GDMT including beta-blocker, Imdur.  No aspirin secondary to chronic anticoagulation  4. Permanent atrial fibrillation/chronic anticoagulation - INR supratherapeutic today with INR 4.2 - dose adjusted by Coumadin Clinic. Denies bleeding complications.  5. S/p AVR - Continue SBE prophylaxis.  Continue warfarin, as above.  Disposition:  Follow up*** in 1 week(s) with Dr. Mariah Milling or APP   Alver Sorrow, NP 07/10/2020, 8:18 AM

## 2020-07-10 NOTE — ED Notes (Signed)
Attempted to place IV x 2. 

## 2020-07-10 NOTE — Telephone Encounter (Signed)
Appointment cancelled because patient admitted to ED   Reach out after dc to reschedule

## 2020-07-11 ENCOUNTER — Encounter: Payer: Self-pay | Admitting: Family Medicine

## 2020-07-11 ENCOUNTER — Observation Stay: Payer: Medicare Other

## 2020-07-11 DIAGNOSIS — I872 Venous insufficiency (chronic) (peripheral): Secondary | ICD-10-CM | POA: Diagnosis present

## 2020-07-11 DIAGNOSIS — R609 Edema, unspecified: Secondary | ICD-10-CM | POA: Diagnosis not present

## 2020-07-11 DIAGNOSIS — I2510 Atherosclerotic heart disease of native coronary artery without angina pectoris: Secondary | ICD-10-CM | POA: Diagnosis present

## 2020-07-11 DIAGNOSIS — F419 Anxiety disorder, unspecified: Secondary | ICD-10-CM | POA: Diagnosis present

## 2020-07-11 DIAGNOSIS — I5033 Acute on chronic diastolic (congestive) heart failure: Secondary | ICD-10-CM | POA: Diagnosis present

## 2020-07-11 DIAGNOSIS — G2581 Restless legs syndrome: Secondary | ICD-10-CM | POA: Diagnosis present

## 2020-07-11 DIAGNOSIS — N179 Acute kidney failure, unspecified: Secondary | ICD-10-CM | POA: Diagnosis present

## 2020-07-11 DIAGNOSIS — F329 Major depressive disorder, single episode, unspecified: Secondary | ICD-10-CM | POA: Diagnosis present

## 2020-07-11 DIAGNOSIS — L03115 Cellulitis of right lower limb: Secondary | ICD-10-CM | POA: Diagnosis present

## 2020-07-11 DIAGNOSIS — I878 Other specified disorders of veins: Secondary | ICD-10-CM | POA: Diagnosis present

## 2020-07-11 DIAGNOSIS — Z7901 Long term (current) use of anticoagulants: Secondary | ICD-10-CM | POA: Diagnosis not present

## 2020-07-11 DIAGNOSIS — I5032 Chronic diastolic (congestive) heart failure: Secondary | ICD-10-CM | POA: Diagnosis not present

## 2020-07-11 DIAGNOSIS — Z20822 Contact with and (suspected) exposure to covid-19: Secondary | ICD-10-CM | POA: Diagnosis present

## 2020-07-11 DIAGNOSIS — L0390 Cellulitis, unspecified: Secondary | ICD-10-CM | POA: Diagnosis present

## 2020-07-11 DIAGNOSIS — E785 Hyperlipidemia, unspecified: Secondary | ICD-10-CM | POA: Diagnosis present

## 2020-07-11 DIAGNOSIS — D649 Anemia, unspecified: Secondary | ICD-10-CM | POA: Diagnosis present

## 2020-07-11 DIAGNOSIS — I4821 Permanent atrial fibrillation: Secondary | ICD-10-CM | POA: Diagnosis present

## 2020-07-11 DIAGNOSIS — T502X5A Adverse effect of carbonic-anhydrase inhibitors, benzothiadiazides and other diuretics, initial encounter: Secondary | ICD-10-CM | POA: Diagnosis not present

## 2020-07-11 DIAGNOSIS — L03119 Cellulitis of unspecified part of limb: Secondary | ICD-10-CM | POA: Diagnosis not present

## 2020-07-11 DIAGNOSIS — G4733 Obstructive sleep apnea (adult) (pediatric): Secondary | ICD-10-CM | POA: Diagnosis present

## 2020-07-11 DIAGNOSIS — Z79899 Other long term (current) drug therapy: Secondary | ICD-10-CM | POA: Diagnosis not present

## 2020-07-11 DIAGNOSIS — G629 Polyneuropathy, unspecified: Secondary | ICD-10-CM | POA: Diagnosis present

## 2020-07-11 DIAGNOSIS — H9191 Unspecified hearing loss, right ear: Secondary | ICD-10-CM | POA: Diagnosis present

## 2020-07-11 DIAGNOSIS — K219 Gastro-esophageal reflux disease without esophagitis: Secondary | ICD-10-CM | POA: Diagnosis present

## 2020-07-11 DIAGNOSIS — I130 Hypertensive heart and chronic kidney disease with heart failure and stage 1 through stage 4 chronic kidney disease, or unspecified chronic kidney disease: Secondary | ICD-10-CM | POA: Diagnosis present

## 2020-07-11 DIAGNOSIS — Z952 Presence of prosthetic heart valve: Secondary | ICD-10-CM | POA: Diagnosis not present

## 2020-07-11 DIAGNOSIS — E876 Hypokalemia: Secondary | ICD-10-CM | POA: Diagnosis not present

## 2020-07-11 DIAGNOSIS — N1832 Chronic kidney disease, stage 3b: Secondary | ICD-10-CM | POA: Diagnosis present

## 2020-07-11 DIAGNOSIS — I350 Nonrheumatic aortic (valve) stenosis: Secondary | ICD-10-CM | POA: Diagnosis not present

## 2020-07-11 DIAGNOSIS — L03116 Cellulitis of left lower limb: Secondary | ICD-10-CM | POA: Diagnosis present

## 2020-07-11 LAB — BASIC METABOLIC PANEL
Anion gap: 14 (ref 5–15)
Anion gap: 12 (ref 5–15)
BUN: 43 mg/dL — ABNORMAL HIGH (ref 8–23)
BUN: 47 mg/dL — ABNORMAL HIGH (ref 8–23)
CO2: 31 mmol/L (ref 22–32)
CO2: 28 mmol/L (ref 22–32)
Calcium: 8.6 mg/dL — ABNORMAL LOW (ref 8.9–10.3)
Calcium: 8.4 mg/dL — ABNORMAL LOW (ref 8.9–10.3)
Chloride: 90 mmol/L — ABNORMAL LOW (ref 98–111)
Chloride: 92 mmol/L — ABNORMAL LOW (ref 98–111)
Creatinine, Ser: 1.21 mg/dL (ref 0.61–1.24)
Creatinine, Ser: 1.26 mg/dL — ABNORMAL HIGH (ref 0.61–1.24)
GFR calc Af Amer: >60 mL/min (ref >60)
GFR calc Af Amer: 59 mL/min — ABNORMAL LOW (ref >60)
GFR calc non Af Amer: 54 mL/min — ABNORMAL LOW (ref >60)
GFR calc non Af Amer: 51 mL/min — ABNORMAL LOW (ref >60)
Glucose, Bld: 113 mg/dL — ABNORMAL HIGH (ref 70–99)
Glucose, Bld: 125 mg/dL — ABNORMAL HIGH (ref 70–99)
Potassium: 2.8 mmol/L — ABNORMAL LOW (ref 3.5–5.1)
Potassium: 3.1 mmol/L — ABNORMAL LOW (ref 3.5–5.1)
Sodium: 135 mmol/L (ref 135–145)
Sodium: 132 mmol/L — ABNORMAL LOW (ref 135–145)

## 2020-07-11 LAB — MAGNESIUM: Magnesium: 2.3 mg/dL (ref 1.7–2.4)

## 2020-07-11 LAB — PROTIME-INR
INR: 2.9 — ABNORMAL HIGH (ref 0.8–1.2)
INR: 2.8 — ABNORMAL HIGH (ref 0.8–1.2)
Prothrombin Time: 29.5 seconds — ABNORMAL HIGH (ref 11.4–15.2)
Prothrombin Time: 28.3 seconds — ABNORMAL HIGH (ref 11.4–15.2)

## 2020-07-11 LAB — CBC
HCT: 30.6 % — ABNORMAL LOW (ref 39.0–52.0)
Hemoglobin: 10 g/dL — ABNORMAL LOW (ref 13.0–17.0)
MCH: 29 pg (ref 26.0–34.0)
MCHC: 32.7 g/dL (ref 30.0–36.0)
MCV: 88.7 fL (ref 80.0–100.0)
Platelets: 209 10*3/uL (ref 150–400)
RBC: 3.45 MIL/uL — ABNORMAL LOW (ref 4.22–5.81)
RDW: 15.8 % — ABNORMAL HIGH (ref 11.5–15.5)
WBC: 8.6 10*3/uL (ref 4.0–10.5)
nRBC: 0 % (ref 0.0–0.2)

## 2020-07-11 LAB — BRAIN NATRIURETIC PEPTIDE: B Natriuretic Peptide: 326.8 pg/mL — ABNORMAL HIGH (ref 0.0–100.0)

## 2020-07-11 LAB — SARS CORONAVIRUS 2 BY RT PCR (HOSPITAL ORDER, PERFORMED IN ~~LOC~~ HOSPITAL LAB): SARS Coronavirus 2: NEGATIVE

## 2020-07-11 MED ORDER — CLONIDINE HCL 0.1 MG PO TABS
0.1000 mg | ORAL_TABLET | Freq: Two times a day (BID) | ORAL | Status: DC
  Administered 2020-07-11 – 2020-07-14 (×8): 0.1 mg via ORAL
  Filled 2020-07-11 (×8): qty 1

## 2020-07-11 MED ORDER — ISOSORBIDE MONONITRATE ER 30 MG PO TB24
60.0000 mg | ORAL_TABLET | Freq: Two times a day (BID) | ORAL | Status: DC
  Administered 2020-07-11 – 2020-07-14 (×8): 60 mg via ORAL
  Filled 2020-07-11: qty 2
  Filled 2020-07-11: qty 1
  Filled 2020-07-11 (×6): qty 2

## 2020-07-11 MED ORDER — POTASSIUM CHLORIDE CRYS ER 20 MEQ PO TBCR
30.0000 meq | EXTENDED_RELEASE_TABLET | Freq: Every day | ORAL | Status: DC
  Administered 2020-07-11 – 2020-07-14 (×4): 30 meq via ORAL
  Filled 2020-07-11 (×5): qty 2

## 2020-07-11 MED ORDER — WARFARIN - PHARMACIST DOSING INPATIENT: Freq: Every day | Status: DC

## 2020-07-11 MED ORDER — METOPROLOL SUCCINATE ER 25 MG PO TB24
12.5000 mg | ORAL_TABLET | Freq: Every day | ORAL | Status: DC
  Administered 2020-07-11 – 2020-07-14 (×4): 12.5 mg via ORAL
  Filled 2020-07-11 (×4): qty 1

## 2020-07-11 MED ORDER — VANCOMYCIN HCL 1250 MG/250ML IV SOLN
1250.0000 mg | INTRAVENOUS | Status: DC
  Filled 2020-07-11: qty 250

## 2020-07-11 MED ORDER — FUROSEMIDE 10 MG/ML IJ SOLN
40.0000 mg | Freq: Every day | INTRAMUSCULAR | Status: DC
  Administered 2020-07-12 – 2020-07-14 (×3): 40 mg via INTRAVENOUS
  Filled 2020-07-11 (×3): qty 4

## 2020-07-11 MED ORDER — ACETAMINOPHEN 500 MG PO TABS
1000.0000 mg | ORAL_TABLET | Freq: Four times a day (QID) | ORAL | Status: DC | PRN
  Administered 2020-07-11 – 2020-07-14 (×5): 1000 mg via ORAL
  Filled 2020-07-11 (×5): qty 2

## 2020-07-11 MED ORDER — TRAMADOL HCL 50 MG PO TABS
50.0000 mg | ORAL_TABLET | Freq: Four times a day (QID) | ORAL | Status: DC | PRN
  Administered 2020-07-11 – 2020-07-14 (×6): 50 mg via ORAL
  Filled 2020-07-11 (×7): qty 1

## 2020-07-11 MED ORDER — ALLOPURINOL 300 MG PO TABS
300.0000 mg | ORAL_TABLET | Freq: Every day | ORAL | Status: DC
  Administered 2020-07-11 – 2020-07-14 (×4): 300 mg via ORAL
  Filled 2020-07-11 (×4): qty 1

## 2020-07-11 MED ORDER — TRAZODONE HCL 50 MG PO TABS
50.0000 mg | ORAL_TABLET | Freq: Every day | ORAL | Status: DC
  Administered 2020-07-11 – 2020-07-13 (×4): 50 mg via ORAL
  Filled 2020-07-11 (×4): qty 1

## 2020-07-11 MED ORDER — FINASTERIDE 5 MG PO TABS
5.0000 mg | ORAL_TABLET | Freq: Every day | ORAL | Status: DC
  Administered 2020-07-11 – 2020-07-14 (×4): 5 mg via ORAL
  Filled 2020-07-11 (×4): qty 1

## 2020-07-11 MED ORDER — CITALOPRAM HYDROBROMIDE 20 MG PO TABS
20.0000 mg | ORAL_TABLET | Freq: Every day | ORAL | Status: DC
  Administered 2020-07-11 – 2020-07-12 (×2): 20 mg via ORAL
  Filled 2020-07-11 (×2): qty 1

## 2020-07-11 MED ORDER — POTASSIUM CHLORIDE CRYS ER 20 MEQ PO TBCR
20.0000 meq | EXTENDED_RELEASE_TABLET | Freq: Once | ORAL | Status: AC
  Administered 2020-07-11: 20 meq via ORAL
  Filled 2020-07-11: qty 1

## 2020-07-11 MED ORDER — DOXAZOSIN MESYLATE 8 MG PO TABS
8.0000 mg | ORAL_TABLET | Freq: Two times a day (BID) | ORAL | Status: DC
  Administered 2020-07-11 – 2020-07-14 (×7): 8 mg via ORAL
  Filled 2020-07-11 (×8): qty 1

## 2020-07-11 MED ORDER — MORPHINE SULFATE (PF) 2 MG/ML IV SOLN
2.0000 mg | Freq: Once | INTRAVENOUS | Status: AC
  Administered 2020-07-11: 2 mg via INTRAVENOUS
  Filled 2020-07-11: qty 1

## 2020-07-11 MED ORDER — VANCOMYCIN HCL 1500 MG/300ML IV SOLN
1500.0000 mg | INTRAVENOUS | Status: DC
  Administered 2020-07-11 – 2020-07-14 (×4): 1500 mg via INTRAVENOUS
  Filled 2020-07-11 (×6): qty 300

## 2020-07-11 MED ORDER — ROPINIROLE HCL 1 MG PO TABS
2.0000 mg | ORAL_TABLET | Freq: Four times a day (QID) | ORAL | Status: DC
  Administered 2020-07-11 – 2020-07-14 (×13): 2 mg via ORAL
  Filled 2020-07-11 (×12): qty 2

## 2020-07-11 MED ORDER — LORATADINE 10 MG PO TABS
10.0000 mg | ORAL_TABLET | Freq: Every day | ORAL | Status: DC
  Administered 2020-07-11 – 2020-07-14 (×4): 10 mg via ORAL
  Filled 2020-07-11 (×4): qty 1

## 2020-07-11 MED ORDER — WARFARIN SODIUM 2.5 MG PO TABS
2.5000 mg | ORAL_TABLET | Freq: Once | ORAL | Status: AC
  Administered 2020-07-11: 2.5 mg via ORAL
  Filled 2020-07-11: qty 1

## 2020-07-11 MED ORDER — TAMSULOSIN HCL 0.4 MG PO CAPS
0.4000 mg | ORAL_CAPSULE | Freq: Every day | ORAL | Status: DC
  Administered 2020-07-11 – 2020-07-13 (×4): 0.4 mg via ORAL
  Filled 2020-07-11 (×4): qty 1

## 2020-07-11 MED ORDER — METOLAZONE 2.5 MG PO TABS
2.5000 mg | ORAL_TABLET | ORAL | Status: DC
  Administered 2020-07-13: 2.5 mg via ORAL
  Filled 2020-07-11: qty 1

## 2020-07-11 NOTE — ED Notes (Signed)
Unable to draw AM labs per infiltrated IV. Removed, lab called, IV team consult ordered. Pt with soiled linens, attempting to gather staff for linen change assist

## 2020-07-11 NOTE — Progress Notes (Addendum)
ANTICOAGULATION CONSULT NOTE  Pharmacy Consult for Warfarin Indication: aortic stenosis s/p mechanical TAVR  Allergies  Allergen Reactions  . Sulfa Antibiotics Other (See Comments)    Reaction:  Unknown   . Levofloxacin Nausea Only    Patient Measurements: Height: 5\' 11"  (180.3 cm) Weight: 95.7 kg (211 lb) IBW/kg (Calculated) : 75.3  Vital Signs: Temp: 98.3 F (36.8 C) (08/17 0731) Temp Source: Oral (08/17 0731) BP: 127/67 (08/17 0731) Pulse Rate: 63 (08/17 0731)  Labs: Recent Labs    07/10/20 1201 07/11/20 0020 07/11/20 0631  HGB 10.4*  --  10.0*  HCT 30.5*  --  30.6*  PLT 212  --  209  LABPROT  --  29.5* 28.3*  INR  --  2.9* 2.8*  CREATININE 1.46*  --  1.21    Estimated Creatinine Clearance: 51.8 mL/min (by C-G formula based on SCr of 1.21 mg/dL).   Medical History: Past Medical History:  Diagnosis Date  . Anxiety 10/11  . Aortic stenosis    a. s/p mechcanical AVR, 2002; b. 10/2018 Echo: Triv AI, mean grad 11/2018.  . Bradycardia    chronic, no symptoms 07/2010  . C. difficile colitis   . Carotid bruit    dopplers in past, no abnormalities  . Chronic diastolic CHF (congestive heart failure) (HCC)    a. Echo 03/2015: EF 60-65%, no RWMA, GR1DD, mild BAE, mild to mod MR, mod TR, PASP 65 mmHg; b. 01/2017 Echo: EF 55-60%, NRWMA, grade 1 diastolic dysfunction.  Normal functioning prosthetic aortic valve.  Mean gradient 50 mmHg.  Sev TR. PASP 02/2017; c. 10/2018 Echo: EF 55-60%, Triv AI, mod dil LA, mod-sev TR, PASP 35-40, mild to mod red RV fxn.  . Coronary artery disease    a. mild, cath, 08/2010; b. medically managed  . Decreased hearing    Right ear  . Depression   . Gastric ulcer   . GERD (gastroesophageal reflux disease)   . Hypertension    BP higher than usual 04/19/10; amlodipine increased by telephone  . Mod-Sev Tricuspid regurgitation    a. 10/2018 Echo: Mod-Sev TR, PASP 35-77mmHg.  43m RLS (restless legs syndrome) 08/23/2015  . S/P AVR    a. St. Jude.  mechanical 2002; b. echo 08/2010 EF 60%, trival AI, mild MR, AVR working well; c. on longterm warfarin tx  . SOB (shortness of breath) 10/11   08/2010,Episodes at 5 AM, eventually felt to be anxiety, after complete workup including catheterization, pt greatly improved with anxiety meds 11/11    Medications:  Warfarin 5mg  on Monday and Wednesday Warfarin 2.5mg  on all other days  Assessment: Pharmacy has been consulted for warfarin dosing and monitoring in an 86yo male admitted for suspected worsening cellulitis. PMH significant for aortic stenosis s/p mechanical TAVR (on Coumadin), chronic diastolic HF, HTN, and CAD.   Hgb is stable and platelets are WNL.   Per med rec, patients last dose of warfarin was 8/16 which would have been 5mg .   Date INR Dose 8/17 2.8  Goal of Therapy:  INR 2.5-3.5 (per Coumadin clinic) Monitor platelets by anticoagulation protocol: Yes   Plan:  INR is therapeutic at 2.8.  Will continue home regimen and give warfarin 2.5 mg tonight. Continue to monitor PT/INR and CBC.   86yo, PharmD Pharmacy Resident  07/11/2020 8:13 AM

## 2020-07-11 NOTE — ED Notes (Signed)
NP notified of pt request for additional analgesia. Orders for tramadol increased in frequency, but still no analgesia in parameters at this time. Made additional request for analgesia, awaiting response at this time

## 2020-07-11 NOTE — Progress Notes (Signed)
ReDs vest reading 37

## 2020-07-11 NOTE — Consult Note (Addendum)
WOC Nurse Consult Note: Reason for Consult: Consult requested for bilat legs.  Pt states he has been followed by the outpatient wound care center prior to admission and was wearing compression wraps, which were" removed because they were too wet." Wound type: Left leg with generalized edema and erythremia, 2 areas of full thickness stasis ulcers which are yellow and moist with mod amt yellow drainage; left inner ankle 4X4X.2cm.  Left upper leg 3X3X.2cm Right leg with 2 areas of linear deep tissue pressure injuries from previous compression wraps being too tight:  Recommend pt does not resume Una boots until these areas are resolved.  Each location is approx .2X15cm, dark red-purple Right lower anterior calf with partial thickness stasis ulcer, 3X3X.1cm, yellow and dry.  Right leg with generalized edema and erythemia,mod amt yellow weeping drainage.  Dressing procedure/placement/frequency: Topical treatment orders provided for bedside nurse to perform as follows to promote drying and healing and provide light compression: Change dressings to bilat legs Q Tues/Thurs/Sat as follows:  Apply xeroform gauze over wounds, then cover with Abd pads. Cover with kerlex, beginning just behind toes, to below knees in a spiral fashion, then ace wrap in the same manner. Please re-consult if further assistance is needed.  Thank-you,  Cammie Mcgee MSN, RN, CWOCN, [place], CNS [phone]

## 2020-07-11 NOTE — ED Notes (Signed)
Pt cleaned, changed, moved to hospital bed. Barry Brunner and keys placed in bag beside bed with clothing. Denies further needs at this time

## 2020-07-11 NOTE — ED Notes (Signed)
Report to Matthew, RN

## 2020-07-11 NOTE — Progress Notes (Signed)
ANTICOAGULATION CONSULT NOTE - Initial Consult  Pharmacy Consult for Warfarin Indication: mechanical valve  Allergies  Allergen Reactions  . Sulfa Antibiotics Other (See Comments)    Reaction:  Unknown   . Levofloxacin Nausea Only    Patient Measurements: Height: 5\' 11"  (180.3 cm) Weight: 95.7 kg (211 lb) IBW/kg (Calculated) : 75.3  Vital Signs: Temp: 97.8 F (36.6 C) (08/16 2201) Temp Source: Oral (08/16 2201) BP: 97/73 (08/17 0123) Pulse Rate: 71 (08/17 0127)  Labs: Recent Labs    07/10/20 1201 07/11/20 0020  HGB 10.4*  --   HCT 30.5*  --   PLT 212  --   LABPROT  --  29.5*  INR  --  2.9*  CREATININE 1.46*  --     Estimated Creatinine Clearance: 42.9 mL/min (A) (by C-G formula based on SCr of 1.46 mg/dL (H)).   Medical History: Past Medical History:  Diagnosis Date  . Anxiety 10/11  . Aortic stenosis    a. s/p mechcanical AVR, 2002; b. 10/2018 Echo: Triv AI, mean grad 11/2018.  . Bradycardia    chronic, no symptoms 07/2010  . C. difficile colitis   . Carotid bruit    dopplers in past, no abnormalities  . Chronic diastolic CHF (congestive heart failure) (HCC)    a. Echo 03/2015: EF 60-65%, no RWMA, GR1DD, mild BAE, mild to mod MR, mod TR, PASP 65 mmHg; b. 01/2017 Echo: EF 55-60%, NRWMA, grade 1 diastolic dysfunction.  Normal functioning prosthetic aortic valve.  Mean gradient 50 mmHg.  Sev TR. PASP 02/2017; c. 10/2018 Echo: EF 55-60%, Triv AI, mod dil LA, mod-sev TR, PASP 35-40, mild to mod red RV fxn.  . Coronary artery disease    a. mild, cath, 08/2010; b. medically managed  . Decreased hearing    Right ear  . Depression   . Gastric ulcer   . GERD (gastroesophageal reflux disease)   . Hypertension    BP higher than usual 04/19/10; amlodipine increased by telephone  . Mod-Sev Tricuspid regurgitation    a. 10/2018 Echo: Mod-Sev TR, PASP 35-87mmHg.  43m RLS (restless legs syndrome) 08/23/2015  . S/P AVR    a. St. Jude. mechanical 2002; b. echo 08/2010 EF 60%,  trival AI, mild MR, AVR working well; c. on longterm warfarin tx  . SOB (shortness of breath) 10/11   08/2010,Episodes at 5 AM, eventually felt to be anxiety, after complete workup including catheterization, pt greatly improved with anxiety meds 11/11    Medications:  (Not in a hospital admission)   Assessment:  Goal of Therapy:  INR 2.5-3.5 Monitor platelets by anticoagulation protocol: Yes   Plan:  INR at goal.  No additional Warfarin needed at this time. Check INR daily and adjust per protocol  13/11 A 07/11/2020,1:43 AM

## 2020-07-11 NOTE — Consult Note (Signed)
Cardiology Consultation:   Patient ID: [name]; [id number]; 05/10/1931   Admit date: 07/10/2020 Date of Consult: 07/11/2020  Primary Care Provider: Gracelyn NurseJohnston, [name] D, MD Primary Cardiologist: [name] Primary Electrophysiologist:  None   Patient Profile:   [name] is a 86 y.o. male with a hx of nonobstructive CAD by LHC in 2011, permanent A. fib, aortic stenosis status post SJM mechanical AVR in 2002 on Coumadin, HFpEF, HTN, HLD, GI bleed, chronic venous stasis with venous insufficiency followed by vascular surgery, chronic dyspnea, RLS, OSA on CPAP, anxiety, depression, and GERD who is being seen today for the evaluation of acute on chronic HFpEF at the request of Dr. Sunnie [name].  History of Present Illness:   [name]'s symptoms of increase in lower extremity swelling date back to 07/2019 at which time diuretics were increased with noted improvement in weight to 206 pounds. However, he noted return of increasing abdominal girth and worsening lower extremity swelling with recommendation to increase metolazone at that time. In this setting, he underwent echo in 01/2020 which showed an EF of 55 to 60%, no regional wall motion abnormalities, normal RV systolic function with mildly enlarged RV cavity size, severe biatrial enlargement, degenerative mitral valve with mild regurgitation, moderate to severe tricuspid regurgitation, mechanical aortic valve was noted with normal structure and function.   More recently, over the past several weeks he has been followed closely in our office by [name] IvanJacquelyn Visser, PA-C and [name] Shieldsaitlin Walker, NP for symptoms concerning for volume overload complicated by tenuous renal function. In 05/2020 notes indicate his weight was up to 220 pounds with noted "weeping" of his lower extremities. In this setting he was advised to take torsemide 40 mg twice daily and metolazone daily for 3 days in a row. With this, he had a slight bump in his renal  function. He was seen in the office on 7/30 with continued lower extremity edema and abdominal distention with recommendation to resume torsemide 40 mg twice daily and metolazone 3 days/week. He was also noted to have a supratherapeutic INR and sent to the ED where he received vitamin K. Chest x-ray on 06/26/2020 showed mild central pulmonary vascular congestion with bibasilar pulmonary edema. He was last seen in the office on 07/03/2020 with a documented weight of 224.2 pounds, with a reported approximate dry weight around 206 pounds, and was sent for IV Lasix through short stay.   He was seen in follow-up by the wound clinic on 8/16 and noted worsening lower extremity swelling along with worsening cellulitis. In this setting he was sent to the ED. Labs in the ED showed a stable BUN/SCR of 44/1.46 trending to 43/1.21, potassium 3.5 trending to 2.8, albumin 3.8, AST/ALT normal, WBC 9.2, Hgb 10.4, Covid negative, INR 2.9.  BNP this morning of 326.  Chest x-ray this morning demonstrated cardiomegaly with increasing interstitial markings along with bibasilar atelectasis.  This morning around 12 AM, he received 60 mg of IV Lasix with minimal urine output documented though a weight trend down to 210.9 pounds with the patient reported baseline weight of around 200 pounds.EKG not performed in the ED or upon admission. Cardiology consulted this morning for evaluation of volume overload.  He continues to note dyspnea that is largely unchanged along with abdominal bloating and lower extremity swelling.  He denies any chest pain, palpitations, dizziness, presyncope, or syncope.  His largest complaint at this time is bilateral lower extremity pain.    Past Medical History:  Diagnosis Date  .  Anxiety 10/11  . Aortic stenosis    a. s/p mechcanical AVR, 2002; b. 10/2018 Echo: Triv AI, mean grad .  . Bradycardia    chronic, no symptoms 07/2010  . C. difficile colitis   . Carotid bruit    dopplers in past, no  abnormalities  . Chronic diastolic CHF (congestive heart failure) (HCC)    a. Echo 03/2015: EF 60-65%, no RWMA, GR1DD, mild BAE, mild to mod MR, mod TR, PASP 65 mmHg; b. 01/2017 Echo: EF 55-60%, NRWMA, grade 1 diastolic dysfunction.  Normal functioning prosthetic aortic valve.  Mean gradient 50 mmHg.  Sev TR. PASP ; c. 10/2018 Echo: EF 55-60%, Triv AI, mod dil LA, mod-sev TR, PASP 35-40, mild to mod red RV fxn.  . Coronary artery disease    a. mild, cath, 08/2010; b. medically managed  . Decreased hearing    Right ear  . Depression   . Gastric ulcer   . GERD (gastroesophageal reflux disease)   . Hypertension    BP higher than usual 04/19/10; amlodipine increased by telephone  . Mod-Sev Tricuspid regurgitation    a. 10/2018 Echo: Mod-Sev TR, PASP 35-77mmHg.  Marland Kitchen RLS (restless legs syndrome) 08/23/2015  . S/P AVR    a. [employer]. mechanical 2002; b. echo 08/2010 EF 60%, trival AI, mild MR, AVR working well; c. on longterm warfarin tx  . SOB (shortness of breath) 10/11   08/2010,Episodes at 5 AM, eventually felt to be anxiety, after complete workup including catheterization, pt greatly improved with anxiety meds 11/11    Past Surgical History:  Procedure Laterality Date  . CARDIAC CATHETERIZATION    . ESOPHAGOGASTRODUODENOSCOPY N/A 04/05/2015   Procedure: ESOPHAGOGASTRODUODENOSCOPY (EGD);  Surgeon: Scot Jun, MD;  Location[facility] ENDOSCOPY;  Service: Endoscopy;  Laterality: N/A;  . ESOPHAGOGASTRODUODENOSCOPY N/A 04/17/2015   Procedure: ESOPHAGOGASTRODUODENOSCOPY (EGD);  Surgeon: Scot Jun, MD;  Location: [facility] ENDOSCOPY;  Service: Endoscopy;  Laterality: N/A;  . ESOPHAGOGASTRODUODENOSCOPY N/A 08/02/2015   Procedure: ESOPHAGOGASTRODUODENOSCOPY (EGD);  Surgeon: Wallace Cullens, MD;  Location: [facility] ENDOSCOPY;  Service: Endoscopy;  Laterality: N/A;  . HERNIA REPAIR    . JOINT REPLACEMENT    . TOTAL HIP ARTHROPLASTY    . VALVE REPLACEMENT  1/02   Aortic; echo 3/09 valve working well; echo  10/11 working well; put on Coumadin     Home Meds: Prior to Admission medications   Medication Sig Start Date End Date Taking? Authorizing Provider  allopurinol (ZYLOPRIM) 300 MG tablet Take 300 mg by mouth daily.  05/16/20  Yes [provider]  Cetirizine HCl 10 MG CAPS Take 10 mg by mouth daily.   Yes [provider]  citalopram (CELEXA) 20 MG tablet Take 20 mg by mouth daily.   Yes [provider]  cloNIDine (CATAPRES) 0.1 MG tablet TAKE 1 TABLET BY MOUTH  TWICE DAILY Patient taking differently: Take 0.1 mg by mouth 2 (two) times daily.  01/05/20  Yes Antonieta Iba, MD  doxazosin (CARDURA) 8 MG tablet Take 8 mg by mouth 2 (two) times daily.  02/09/20  Yes [provider]  finasteride (PROSCAR) 5 MG tablet Take 5 mg by mouth daily.     Yes [provider]  isosorbide mononitrate (IMDUR) 60 MG 24 hr tablet Take 1 tablet (60 mg total) by mouth 2 (two) times daily. 05/05/20  Yes Creig Hines, NP  metolazone (ZAROXOLYN) 2.5 MG tablet Take 1 tablet (2.5 mg total) by mouth 2 (two) times a week. 02/17/20 07/11/20  Yes Antonieta Iba, MD  metoprolol succinate (TOPROL-XL) 25 MG 24 hr tablet Take 0.5 tablets (12.5 mg total) by mouth daily. 03/06/20  Yes Gollan, Tollie Pizza, MD  neomycin-polymyxin-hydrocortisone (CORTISPORIN) 3.5-10000-1 OTIC suspension Place 3 drops into the right ear 4 (four) times daily. 06/22/20  Yes [provider]  potassium chloride (KLOR-CON) 10 MEQ tablet TAKE 3 TABLETS (30 MEQ) BY MOUTH DAILY. TAKE AN ADDITIONAL 2TABS ON THE DAYS YOU TAKE METOLAZONE 06/08/20  Yes Gollan, Tollie Pizza, MD  rOPINIRole (REQUIP) 2 MG tablet TAKE 1 TABLET BY MOUTH 4  TIMES DAILY Patient taking differently: Take 2 mg by mouth in the morning, at noon, in the evening, and at bedtime. Take 1 tablet by mouth 4  times daily 09/14/18  Yes Millikan, Aundra Millet, NP  torsemide (DEMADEX) 20 MG tablet TAKE 2 TABLETS BY MOUTH  TWICE DAILY Patient taking  differently: Take 40 mg by mouth 2 (two) times daily. TAKE 2 TABLETS BY MOUTH  TWICE DAILY 04/05/20  Yes Gollan, Tollie Pizza, MD  warfarin (COUMADIN) 5 MG tablet TAKE 1 TABLET BY MOUTH  DAILY OR AS DIRECTED BY THE ANTICOAGULATION CLINIC Patient taking differently: Take 2.5-5 mg by mouth daily. TAKE 1 TABLET BY MOUTH  DAILY OR AS DIRECTED BY THE ANTICOAGULATION CLINIC 06/19/20  Yes Gollan, Tollie Pizza, MD  amoxicillin (AMOXIL) 500 MG capsule 4 CAPSULES PRIOR TO DENTAL PROCEDURES AS DIRECTED 06/27/20   [provider]  cephALEXin (KEFLEX) 500 MG capsule Take 1 capsule (500 mg total) by mouth 3 (three) times daily. Patient not taking: Reported on 07/11/2020 04/20/20   Georgiana Spinner, NP  Tamsulosin HCl (FLOMAX) 0.4 MG CAPS Take 0.4 mg by mouth at bedtime.     [provider]  traMADol (ULTRAM) 50 MG tablet Take 50 mg by mouth 2 (two) times daily as needed. Patient not taking: Reported on 07/11/2020 11/02/19   [provider]  traZODone (DESYREL) 50 MG tablet Take 50 mg by mouth at bedtime.    [provider]    Inpatient Medications: Scheduled Meds: . allopurinol  300 mg Oral Daily  . citalopram  20 mg Oral Daily  . cloNIDine  0.1 mg Oral BID  . doxazosin  8 mg Oral BID  . finasteride  5 mg Oral Daily  . [START ON 07/12/2020] furosemide  40 mg Intravenous Daily  . isosorbide mononitrate  60 mg Oral BID  . loratadine  10 mg Oral Daily  . [START ON 07/13/2020] metolazone  2.5 mg Oral Once per day on Mon Thu  . metoprolol succinate  12.5 mg Oral Daily  . potassium chloride  20 mEq Oral Once  . potassium chloride SA  30 mEq Oral Daily  . rOPINIRole  2 mg Oral QID  . tamsulosin  0.4 mg Oral QHS  . traZODone  50 mg Oral QHS   Continuous Infusions: . vancomycin 1,500 mg (07/11/20 0958)   PRN Meds: acetaminophen, traMADol  Allergies:   Allergies  Allergen Reactions  . Sulfa Antibiotics Other (See Comments)    Reaction:  Unknown   . Levofloxacin Nausea Only     Social History:   Social History   Socioeconomic History  . Marital status: Married    Spouse name: Not on file  . Number of children: 3  . Years of education: some coll.  . Highest education level: Not on file  Occupational History  . Occupation: Retired    Associate Professor: RETIRED  Tobacco Use  . Smoking status: Former Smoker  Types: Cigarettes    Quit date: 1979    Years since quitting: 42.6  . Smokeless tobacco: Never Used  Vaping Use  . Vaping Use: Never used  Substance and Sexual Activity  . Alcohol use: Yes    Alcohol/week: 3.0 standard drinks    Types: 3 Glasses of wine per week  . Drug use: No  . Sexual activity: Never  Other Topics Concern  . Not on file  Social History Narrative   No regular exercise.   Patient lives at [facility]. His wife lives in same facility but she lives in the assisted living part.      Patient drinks 1-2 cups of caffeine daily.   Patient is right handed.   Social Determinants of Health   Financial Resource Strain:   . Difficulty of Paying Living Expenses:   Food Insecurity:   . Worried About Programme researcher, broadcasting/film/video in the Last Year:   . Barista in the Last Year:   Transportation Needs:   . Freight forwarder (Medical):   Marland Kitchen Lack of Transportation (Non-Medical):   Physical Activity:   . Days of Exercise per Week:   . Minutes of Exercise per Session:   Stress:   . Feeling of Stress :   Social Connections:   . Frequency of Communication with Friends and Family:   . Frequency of Social Gatherings with Friends and Family:   . Attends Religious Services:   . Active Member of Clubs or Organizations:   . Attends Banker Meetings:   Marland Kitchen Marital Status:   Intimate Partner Violence:   . Fear of Current or Ex-Partner:   . Emotionally Abused:   Marland Kitchen Physically Abused:   . Sexually Abused:      Family History:   Family History  Problem Relation Age of Onset  . Hypertension Mother   . Diabetes type II Mother   .  Heart disease Mother   . Heart attack Mother   . Hypertension Father   . Heart disease Father   . Stroke Father   . Stroke Brother   . Stroke Brother   . Hypertension Other     ROS:  Review of Systems  Constitutional: Positive for malaise/fatigue. Negative for chills, diaphoresis, fever and weight loss.  HENT: Negative for congestion.   Eyes: Negative for discharge and redness.  Respiratory: Positive for shortness of breath and wheezing. Negative for cough and sputum production.   Cardiovascular: Positive for orthopnea and leg swelling. Negative for chest pain, palpitations, claudication and PND.  Gastrointestinal: Negative for abdominal pain, blood in stool, heartburn, melena, nausea and vomiting.  Musculoskeletal: Negative for falls and myalgias.  Skin: Negative for rash.  Neurological: Positive for weakness. Negative for dizziness, tingling, tremors, sensory change, speech change, focal weakness and loss of consciousness.  Endo/Heme/Allergies: Does not bruise/bleed easily.  Psychiatric/Behavioral: Negative for substance abuse. The patient is not nervous/anxious.   All other systems reviewed and are negative.     Physical Exam/Data:   Vitals:   07/11/20 0335 07/11/20 0731 07/11/20 0809 07/11/20 0812  BP: 121/65 127/67 123/75 112/72  Pulse: 68 63 (!) 111 72  Resp: 18 18 19 19   Temp:  98.3 F (36.8 C) 97.6 F (36.4 C) 97.8 F (36.6 C)  TempSrc:  Oral Oral Oral  SpO2: 98% 98% 94% 95%  Weight:      Height:        Intake/Output Summary (Last 24 hours) at 07/11/2020 1041 Last data  filed at 07/11/2020 0521 Gross per 24 hour  Intake --  Output 400 ml  Net -400 ml   Filed Weights   07/10/20 1200 07/11/20 0129  Weight: 101 kg 95.7 kg   Body mass index is 29.43 kg/m.   Physical Exam: General: Well developed, well nourished, in no acute distress. Head: Normocephalic, atraumatic, sclera non-icteric, no xanthomas, nares without discharge.  Neck: Negative for carotid  bruits. JVD not elevated. Lungs: Faint crackles noted along the bilateral bases along with faint bilateral expiratory wheezing. Breathing is unlabored. Heart: Irregular irregular with S1 S2.  Mechanical murmur, no rubs, or gallops appreciated. Abdomen: Soft, non-tender, appears more bloated than distended with normoactive bowel sounds. No hepatomegaly. No rebound/guarding. No obvious abdominal masses. Msk:  Strength and tone appear normal for age. Extremities: No clubbing or cyanosis.  Bilateral lower extremities are wrapped to just below the knees with trace to 1+ pitting edema noted to the bilateral thighs.  Neuro: Alert and oriented X 3. No facial asymmetry. No focal deficit. Moves all extremities spontaneously. Psych:  Responds to questions appropriately with a normal affect.   EKG:  The EKG was personally reviewed and demonstrates: Not performed at time of admission Telemetry:  Telemetry was personally reviewed and demonstrates: Not on telemetry  Weights: Filed Weights   07/10/20 1200 07/11/20 0129  Weight: 101 kg 95.7 kg    Relevant CV Studies:  2D echo 02/11/2020: 1. Left ventricular ejection fraction, by estimation, is 55 to 60%. The  left ventricle has normal function. The left ventricle has no regional  wall motion abnormalities. Left ventricular diastolic function could not  be evaluated.  2. Right ventricular systolic function is normal. The right ventricular  size is mildly enlarged.  3. Left atrial size was severely dilated.  4. Right atrial size was severely dilated.  5. The mitral valve is degenerative. Mild mitral valve regurgitation.  6. Tricuspid valve regurgitation is moderate to severe.  7. The aortic valve has been repaired/replaced. Aortic valve  regurgitation is not visualized. There is a mechanical valve present in  the aortic position. Procedure Date: 2002. Echo findings are consistent  with normal structure and function of the aortic  valve  prosthesis.    Laboratory Data:  Chemistry Recent Labs  Lab 07/06/20 1037 07/10/20 1201 07/11/20 0631  NA 133* 133* 135  K 3.0* 3.5 2.8*  CL 91* 89* 90*  CO2 GLUCOSE 102* 105* 113*  BUN 43* 44* 43*  CREATININE 1.48* 1.46* 1.21  CALCIUM 8.5* 8.9 8.6*  GFRNONAA 42* 43* 54*  GFRAA 49* 50* >60  ANIONGAP Recent Labs  Lab 07/10/20 1201  PROT 7.1  ALBUMIN 3.8  AST 22  ALT 10  ALKPHOS 108  BILITOT 1.9*   Hematology Recent Labs  Lab 07/10/20 1201 07/11/20 0631  WBC 9.2 8.6  RBC 3.55* 3.45*  HGB 10.4* 10.0*  HCT 30.5* 30.6*  MCV 85.9 88.7  MCH 29.3 29.0  MCHC 34.1 32.7  RDW 15.8* 15.8*  PLT 212 209   Cardiac EnzymesNo results for input(s): TROPONINI in the last 168 hours. No results for input(s): TROPIPOC in the last 168 hours.  BNP Recent Labs  Lab 07/11/20 0631  BNP 326.8*    DDimer No results for input(s): DDIMER in the last 168 hours.  Radiology/Studies:  [facility] Chest Port 1 View  Result Date: 07/11/2020 IMPRESSION: 1. Cardiomegaly with increasing interstitial pattern compatible with congestive heart failure. 2. Bibasilar atelectasis.  Electronically Signed   By: Marin Roberts M.D.   On: 07/11/2020 09:47    Assessment and Plan:   1. Acute on chronic HFpEF/venous insufficiency:  -He does remain volume overloaded though clinically, he does not appear to be significantly volume overloaded  -Documented this weight approximately 209 pounds with the patient reported dry weight of approximately 200 pounds  -He drinks large amounts of tea and Coke daily which is likely contributing to his overall presentation  -It does appear there is a very component of venous stasis with dependent edema contributing to his lower extremity swelling but recommendation to continue wraps and leg elevation  -Continue IV Lasix 40 mg daily along with twice weekly metolazone for now with close monitoring of renal function and potassium  -Place ReDs vest today   -Pending clinical progression could consider RHC for further estimation of volume status however, this would require holding his Coumadin with heparin bridge given A. fib and mechanical AVR., For now we will continue him on PTA Coumadin and follow him clinically  -As an outpatient, could consider cardiac MRI if mechanical AVR status allows  -Place on telemetry  -Daily weights  -Strict I's and O's  -CHF education   2. Nonobstructive CAD:  -No symptoms concerning for angina  -On Coumadin in place of aspirin  -Obtain admission EKG  -Continue PTA Imdur   3. Permanent A. fib:  -Place on telemetry  -Obtain EKG as above  -Ventricular rate well controlled on physical exam  -Continue Toprol-XL  -CHADS2VASc 5 (CHF, HTN, age x 2, vascular disease)  -Continue Coumadin with goal INR being 2.5-3.5 in the setting of permanent A. fib with mechanical AVR   4. History of mechanical AVR:  -Coumadin as above  -Recent echo in 01/2020 demonstrating normal prosthetic valve function   5. AKI:  -Renal function improving with IV diuresis  -Continue to monitor   6. Hypokalemia:  -Replete potassium to goal 4.0  -Magnesium at goal  -Repeat BMP this afternoon   7. HTN:  -Blood pressure well controlled  -Continue current regimen   8. Anemia:  -Stable   9. OSA:  -Recommend CPAP use while in the hospital      For questions or updates, please contact [facility] Please consult [email or web address] for contact info under Cardiology/STEMI.   Signed, Eula Listen, PA-C [facility] Pager: [phone] 07/11/2020, 10:41 AM

## 2020-07-11 NOTE — Progress Notes (Signed)
PROGRESS NOTE    [name]  IRW:[id number] DOB: 1931-01-12 DOA: 07/10/2020 PCP: Gracelyn Nurse, MD   Brief Narrative: 86 year old with past medical history significant for aortic stenosis status post mechanical valve replacement on Coumadin, chronic diastolic heart failure, hypertension, CAD, chronic venous listhesis, history of CVA who presents complaining of worsening lower extremity redness and cellulitis.  Per family report patient had lower extremity venous ablation performed back in May by vascular surgery.  Since then he has been followed by wound care.  He has been followed by his cardiologist for adjustment of his diuretics. He was seen by his wound care team and was recommended to presented to the ED due to worsening erythema suspicious for cellulitis.     Assessment & Plan:   Principal Problem:   Cellulitis Active Problems:   Aortic stenosis   Warfarin anticoagulation   History of mechanical aortic valve replacement   RLS (restless legs syndrome)   Chronic diastolic CHF (congestive heart failure) (HCC)   AKI (acute kidney injury) (HCC)   Acute on chronic heart failure with preserved ejection fraction (HFpEF) (HCC)   Permanent atrial fibrillation (HCC)   1-Lower extremity cellulitis superimposed on chronic venous cyst versus chronic lower extremity wound: Wound care consulted. Patient is started on IV vancomycin Lower extremity edema, continue with compression wraps  2-Sepsis status post mechanical valve replacement: Continue with Coumadin, pharmacy to dose  3-Acute on chronic diastolic heart failure: Presented with worsening shortness of breath, lower extremity edema.  Dry weight 206.  Present with a weight of 211. Cardiology has been consulted Continue with Imdur metoprolol. Check chest x-ray and BNP. On 40 mg of IV  Lasix daily.  Metolazone twice a week Continue with Imdur, metoprolol  Restless leg syndrome: Continue with Requip  CKD stage  IIIb Creatinine 1.4 Monitor on IV Laxis  Hypokalemia: Replete with oral potassium A. fib: Continue with metoprolol and Coumadin  Estimated body mass index is 29.43 kg/m as calculated from the following:   Height as of this encounter: 5\' 11"  (1.803 m).   Weight as of this encounter: 95.7 kg.   DVT prophylaxis: Coumadin Code Status: Full code Family Communication: Care discussed with patient Disposition Plan:  Status is: Observation  The patient remains OBS appropriate and will d/c before 2 midnights.  Dispo: The patient is from: Home              Anticipated d/c is to: Home              Anticipated d/c date is: 2 days              Patient currently is not medically stable to d/c.        Consultants:  Cardiology  Procedures:   None  Antimicrobials:  Vancomycin 80/16  Subjective: He report worsening shortness of breath over the last few days. Weight has increased from 206 ----2011 He just got his lower extremity wound redressed.  Objective: Vitals:   07/11/20 0335 07/11/20 0731 07/11/20 0809 07/11/20 0812  BP: 121/65 127/67 123/75 112/72  Pulse: 68 63 (!) 111 72  Resp: 18 18 19 19   Temp:  98.3 F (36.8 C) 97.6 F (36.4 C) 97.8 F (36.6 C)  TempSrc:  Oral Oral Oral  SpO2: 98% 98% 94% 95%  Weight:      Height:        Intake/Output Summary (Last 24 hours) at 07/11/2020 1446 Last data filed at 07/11/2020 0521 Gross per 24 hour  Intake --  Output 400 ml  Net -400 ml   Filed Weights   07/10/20 1200 07/11/20 0129  Weight: 101 kg 95.7 kg    Examination:  General exam: Appears calm and comfortable  Respiratory system: B/L crackles.  Respiratory effort normal. Cardiovascular system: S1 & S2 heard, RRR.  Systolic click from mechanical valve Gastrointestinal system: Abdomen is nondistended, soft and nontender. No organomegaly or masses felt. Normal bowel sounds heard. Central nervous system: Alert and oriented. Extremities: BL LE with dressing.     Data Reviewed: I have personally reviewed following labs and imaging studies  CBC: Recent Labs  Lab 07/10/20 1201 07/11/20 0631  WBC 9.2 8.6  NEUTROABS 7.4  --   HGB 10.4* 10.0*  HCT 30.5* 30.6*  MCV 85.9 88.7  PLT 212 209   Basic Metabolic Panel: Recent Labs  Lab 07/06/20 1037 07/10/20 1201 07/11/20 0631  NA 133* 133* 135  K 3.0* 3.5 2.8*  CL 91* 89* 90*  CO2 29 31 31   GLUCOSE 102* 105* 113*  BUN 43* 44* 43*  CREATININE 1.48* 1.46* 1.21  CALCIUM 8.5* 8.9 8.6*  MG  --   --  2.3   GFR: Estimated Creatinine Clearance: 51.8 mL/min (by C-G formula based on SCr of 1.21 mg/dL). Liver Function Tests: Recent Labs  Lab 07/10/20 1201  AST 22  ALT 10  ALKPHOS 108  BILITOT 1.9*  PROT 7.1  ALBUMIN 3.8   No results for input(s): LIPASE, AMYLASE in the last 168 hours. No results for input(s): AMMONIA in the last 168 hours. Coagulation Profile: Recent Labs  Lab 07/11/20 0020 07/11/20 0631  INR 2.9* 2.8*   Cardiac Enzymes: No results for input(s): CKTOTAL, CKMB, CKMBINDEX, TROPONINI in the last 168 hours. BNP (last 3 results) No results for input(s): PROBNP in the last 8760 hours. HbA1C: No results for input(s): HGBA1C in the last 72 hours. CBG: No results for input(s): GLUCAP in the last 168 hours. Lipid Profile: No results for input(s): CHOL, HDL, LDLCALC, TRIG, CHOLHDL, LDLDIRECT in the last 72 hours. Thyroid Function Tests: No results for input(s): TSH, T4TOTAL, FREET4, T3FREE, THYROIDAB in the last 72 hours. Anemia Panel: No results for input(s): VITAMINB12, FOLATE, FERRITIN, TIBC, IRON, RETICCTPCT in the last 72 hours. Sepsis Labs: Recent Labs  Lab 07/10/20 1201  LATICACIDVEN 1.0    Recent Results (from the past 240 hour(s))  Blood culture (routine x 2)     Status: None (Preliminary result)   Collection Time: 07/10/20 11:21 PM   Specimen: BLOOD  Result Value Ref Range Status   Specimen Description BLOOD LEFT ANTECUBITAL  Final   Special  Requests   Final    BOTTLES DRAWN AEROBIC AND ANAEROBIC Blood Culture results may not be optimal due to an excessive volume of blood received in culture bottles   Culture   Final    NO GROWTH < 12 HOURS Performed at [facility], [address], [place], [place]    Report Status PENDING  Incomplete  Blood culture (routine x 2)     Status: None (Preliminary result)   Collection Time: 07/10/20 11:21 PM   Specimen: BLOOD  Result Value Ref Range Status   Specimen Description BLOOD RIGHT ANTECUBITAL  Final   Special Requests   Final    BOTTLES DRAWN AEROBIC AND ANAEROBIC Blood Culture adequate volume   Culture   Final    NO GROWTH < 12 HOURS Performed at [facility], [address], [place], [place]  [address]    Report Status PENDING  Incomplete  SARS Coronavirus 2 by RT PCR (hospital order, performed in [facility] hospital lab) Nasopharyngeal Nasopharyngeal Swab     Status: None   Collection Time: 07/10/20 11:21 PM   Specimen: Nasopharyngeal Swab  Result Value Ref Range Status   SARS Coronavirus 2 NEGATIVE NEGATIVE Final    Comment: (NOTE) SARS-CoV-2 target nucleic acids are NOT DETECTED.  The SARS-CoV-2 RNA is generally detectable in upper and lower respiratory specimens during the acute phase of infection. The lowest concentration of SARS-CoV-2 viral copies this assay can detect is 250 copies / mL. A negative result does not preclude SARS-CoV-2 infection and should not be used as the sole basis for treatment or other patient management decisions.  A negative result may occur with improper specimen collection / handling, submission of specimen other than nasopharyngeal swab, presence of viral mutation(s) within the areas targeted by this assay, and inadequate number of viral copies (<250 copies / mL). A negative result must be combined with clinical observations, patient history, and epidemiological information.  Fact Sheet for Patients:    BoilerBrush.com.cy  Fact Sheet for Healthcare Providers: https://pope.com/  This test is not yet approved or  cleared by the [place] FDA and has been authorized for detection and/or diagnosis of SARS-CoV-2 by FDA under an Emergency Use Authorization (EUA).  This EUA will remain in effect (meaning this test can be used) for the duration of the COVID-19 declaration under Section 564(b)(1) of the Act, 21 U.S.C. section 360bbb-3(b)(1), unless the authorization is terminated or revoked sooner.  Performed at [facility], [address] [place], [place] [address]          Radiology Studies: [facility] Chest [place] 1 View  Result Date: 07/11/2020 CLINICAL DATA:  Dyspnea. History of coronary artery disease and aortic valve replacement. Progressive cellulitis. EXAM: PORTABLE CHEST 1 VIEW COMPARISON:  Two-view chest x-ray 06/26/2020 FINDINGS: Heart is enlarged. Valve replacement noted. CABG noted. Increasing interstitial pattern is present. Bibasilar opacities likely reflect atelectasis. IMPRESSION: 1. Cardiomegaly with increasing interstitial pattern compatible with congestive heart failure. 2. Bibasilar atelectasis. Electronically Signed   By: Marin Roberts M.D.   On: 07/11/2020 09:47        Scheduled Meds:  allopurinol  300 mg Oral Daily   citalopram  20 mg Oral Daily   cloNIDine  0.1 mg Oral BID   doxazosin  8 mg Oral BID   finasteride  5 mg Oral Daily   [START ON 07/12/2020] furosemide  40 mg Intravenous Daily   isosorbide mononitrate  60 mg Oral BID   loratadine  10 mg Oral Daily   [START ON 07/13/2020] metolazone  2.5 mg Oral Once per day on Mon Thu   metoprolol succinate  12.5 mg Oral Daily   potassium chloride  20 mEq Oral Once   potassium chloride SA  30 mEq Oral Daily   rOPINIRole  2 mg Oral QID   tamsulosin  0.4 mg Oral QHS   traZODone  50 mg Oral QHS   warfarin  2.5 mg Oral ONCE-1600    Warfarin - Pharmacist Dosing Inpatient   Does not apply q1600   Continuous Infusions:  vancomycin 1,500 mg (07/11/20 0958)     LOS: 0 days    Time spent: 35 minutes.     Alba Cory, MD [facility]   If 7PM-7AM, please contact night-coverage [email or web address]  07/11/2020, 2:46 PM

## 2020-07-11 NOTE — Progress Notes (Addendum)
Pharmacy Antibiotic Note  [name] is a 86 y.o. male admitted on 07/10/2020 with cellulitis.  Pharmacy has been consulted for Vancomycin dosing.  Plan: Vancomycin 1250 mg IV Q 24 hrs. Goal AUC 400-550. Expected AUC: 496.9, Css min 13.7 SCr used: 1.46 Pt did not get a full loading dose so will start next dose early  Height: 5\' 11"  (180.3 cm) Weight: 95.7 kg (211 lb) IBW/kg (Calculated) : 75.3  Temp (24hrs), Avg:97.9 F (36.6 C), Min:97.7 F (36.5 C), Max:98.1 F (36.7 C)  Recent Labs  Lab 07/06/20 1037 07/10/20 1201  WBC  --  9.2  CREATININE 1.48* 1.46*  LATICACIDVEN  --  1.0    Estimated Creatinine Clearance: 42.9 mL/min (A) (by C-G formula based on SCr of 1.46 mg/dL (H)).    Allergies  Allergen Reactions  . Sulfa Antibiotics Other (See Comments)    Reaction:  Unknown   . Levofloxacin Nausea Only    Antimicrobials this admission:   >>    >>   Dose adjustments this admission:   Microbiology results:  BCx:   UCx:    Sputum:    MRSA PCR:   Thank you for allowing pharmacy to be a part of this patient's care.  07/12/20 A 07/11/2020 2:16 AM

## 2020-07-11 NOTE — Progress Notes (Addendum)
Pharmacy Antibiotic Note  [name] is a 86 y.o. male admitted on 07/10/2020 with cellulitis.  Pharmacy has been consulted for Vancomycin dosing. Patient did not receive a full loading dose.  8/17 renal function has improved since yesterday (8/16) - 1.46>>1.21  Plan: Will change Vancomycin 1250 mg IV Q 24 hrs to Vancomycin 1500mg  IV Q24 hrs per dosing nomogram. Will still gave maintenance dose early since patient did not receive full loading dose.  --Continue to monitor renal function and order vanc level as indicated   Height: 5\' 11"  (180.3 cm) Weight: 95.7 kg (211 lb) IBW/kg (Calculated) : 75.3  Temp (24hrs), Avg:97.9 F (36.6 C), Min:97.6 F (36.4 C), Max:98.3 F (36.8 C)  Recent Labs  Lab 07/06/20 1037 07/10/20 1201 07/11/20 0631  WBC  --  9.2 8.6  CREATININE 1.48* 1.46* 1.21  LATICACIDVEN  --  1.0  --     Estimated Creatinine Clearance: 51.8 mL/min (by C-G formula based on SCr of 1.21 mg/dL).    Allergies  Allergen Reactions  . Sulfa Antibiotics Other (See Comments)    Reaction:  Unknown   . Levofloxacin Nausea Only    Antimicrobials this admission: 8/17 Vancomycin >>   Dose adjustments this admission: 8/17 Vancomycin increased from 1250mg  q24h to 1500mg  q24h based on improved renal function  Microbiology results: 8/16 BCx: NG <12h  Thank you for allowing pharmacy to be a part of this patient's care.  9/17, PharmD Pharmacy Resident  07/11/2020 8:25 AM

## 2020-07-11 NOTE — Progress Notes (Signed)
Pharmacy Medication Clarification  Patient currently ordered Tamsulosin 0.4 mg po at bedtime(prescribed by Dr. Bjorn Pippin) and Doxazosin 8 mg po BID(prescribed by Julien Nordmann).  Clarified with patient's urologist as to whether patient should be on duplicate Alpha-1 Blockers.  Per message with Dr. Bjorn Pippin,: he would not change his regimen if that is what he has been on. He has a few guys that do better with dual therapy.  Bari Mantis PharmD Clinical Pharmacist 07/11/2020

## 2020-07-12 ENCOUNTER — Telehealth: Payer: Self-pay | Admitting: Cardiovascular Disease

## 2020-07-12 DIAGNOSIS — I4821 Permanent atrial fibrillation: Secondary | ICD-10-CM

## 2020-07-12 DIAGNOSIS — I5033 Acute on chronic diastolic (congestive) heart failure: Secondary | ICD-10-CM

## 2020-07-12 DIAGNOSIS — I4811 Longstanding persistent atrial fibrillation: Secondary | ICD-10-CM

## 2020-07-12 LAB — BASIC METABOLIC PANEL
Anion gap: 13 (ref 5–15)
BUN: 44 mg/dL — ABNORMAL HIGH (ref 8–23)
CO2: 30 mmol/L (ref 22–32)
Calcium: 8.8 mg/dL — ABNORMAL LOW (ref 8.9–10.3)
Chloride: 90 mmol/L — ABNORMAL LOW (ref 98–111)
Creatinine, Ser: 1.4 mg/dL — ABNORMAL HIGH (ref 0.61–1.24)
GFR calc Af Amer: 52 mL/min — ABNORMAL LOW (ref >60)
GFR calc non Af Amer: 45 mL/min — ABNORMAL LOW (ref >60)
Glucose, Bld: 97 mg/dL (ref 70–99)
Potassium: 3 mmol/L — ABNORMAL LOW (ref 3.5–5.1)
Sodium: 133 mmol/L — ABNORMAL LOW (ref 135–145)

## 2020-07-12 LAB — PROTIME-INR
INR: 3.4 — ABNORMAL HIGH (ref 0.8–1.2)
Prothrombin Time: 33.5 seconds — ABNORMAL HIGH (ref 11.4–15.2)

## 2020-07-12 LAB — CBC
HCT: 29.1 % — ABNORMAL LOW (ref 39.0–52.0)
Hemoglobin: 9.5 g/dL — ABNORMAL LOW (ref 13.0–17.0)
MCH: 29.1 pg (ref 26.0–34.0)
MCHC: 32.6 g/dL (ref 30.0–36.0)
MCV: 89.3 fL (ref 80.0–100.0)
Platelets: 193 10*3/uL (ref 150–400)
RBC: 3.26 MIL/uL — ABNORMAL LOW (ref 4.22–5.81)
RDW: 15.9 % — ABNORMAL HIGH (ref 11.5–15.5)
WBC: 7.4 10*3/uL (ref 4.0–10.5)
nRBC: 0 % (ref 0.0–0.2)

## 2020-07-12 LAB — CREATININE, SERUM
Creatinine, Ser: 1.41 mg/dL — ABNORMAL HIGH (ref 0.61–1.24)
GFR calc Af Amer: 52 mL/min — ABNORMAL LOW (ref >60)
GFR calc non Af Amer: 45 mL/min — ABNORMAL LOW (ref >60)

## 2020-07-12 MED ORDER — POTASSIUM CHLORIDE CRYS ER 20 MEQ PO TBCR
30.0000 meq | EXTENDED_RELEASE_TABLET | Freq: Once | ORAL | Status: AC
  Administered 2020-07-12: 30 meq via ORAL

## 2020-07-12 MED ORDER — POTASSIUM CHLORIDE CRYS ER 20 MEQ PO TBCR: 30.0000 meq | EXTENDED_RELEASE_TABLET | Freq: Once | ORAL | Status: DC

## 2020-07-12 MED ORDER — WARFARIN SODIUM 1 MG PO TABS
1.0000 mg | ORAL_TABLET | Freq: Once | ORAL | Status: AC
  Administered 2020-07-12: 1 mg via ORAL
  Filled 2020-07-12: qty 1

## 2020-07-12 MED ORDER — DULOXETINE HCL 30 MG PO CPEP
30.0000 mg | ORAL_CAPSULE | Freq: Every day | ORAL | Status: DC
  Administered 2020-07-12 – 2020-07-14 (×3): 30 mg via ORAL
  Filled 2020-07-12 (×3): qty 1

## 2020-07-12 MED ORDER — POTASSIUM CHLORIDE CRYS ER 20 MEQ PO TBCR
30.0000 meq | EXTENDED_RELEASE_TABLET | Freq: Once | ORAL | Status: AC
  Administered 2020-07-12: 30 meq via ORAL
  Filled 2020-07-12: qty 1

## 2020-07-12 NOTE — Evaluation (Signed)
Physical Therapy Evaluation Patient Details Name: [name] MRN: [id number] DOB: May 13, 1931 Today's Date: 07/12/2020   History of Present Illness  [name] is an 86 y/o male who was admitted for suspected worsening cellulitis and increased erythema. PMH includes aortic stenosis, s/p mechanical TAVR on coumadin, chronic dCHF, HTN, CAD, chronicvenous stasis, GERD, hx of c-diff, hx of CVA, and LLE venous ablation in May with vascular surgery.  Clinical Impression  Pt lying in bed upon arrival to room and immediately reporting cramping in BLE and pain in L hip 5/10. Pt agreeable to PT evaluation. Did not perform resistive MMT to BLE secondary to BLE/foot wounds. Pt able to perform supine to sit with HOB elevated with supervision only and requiring increased time to perform and was noted to be wheezing after transition. After short rest break, pt perform sit to stand to RW initiating independently then requiring CGA for steadying during transition of hands from bed to RW. Pt ambulated 20 feet with CGA-min A for steadying and safety and pt noted to have decreased step lengths and foot clearance bilaterally. Pt required increased time to ambulate 20 foot. Pt presents with deficits in strength, mobility, endurance, balance, and pain at this time and is a falls risk. Recommend skilled therapy this acute stay to address aforementioned deficits and STR at discharge due to decreased caregiver assist at home and to optimize return to PLOF/maximize functional mobility.    Follow Up Recommendations SNF    Equipment Recommendations  Rolling walker with 5" wheels    Recommendations for Other Services       Precautions / Restrictions Precautions Precautions: Fall Precaution Comments: high fall risk Restrictions Weight Bearing Restrictions: No      Mobility  Bed Mobility Overal bed mobility: Needs Assistance Bed Mobility: Supine to Sit     Supine to sit: HOB  elevated;Supervision     General bed mobility comments: Pt able to perform supine to sit from elevated [facility] with supervision for safety; pt required increased time to perform and noted wheezing after transition  Transfers Overall transfer level: Needs assistance Equipment used: Rolling walker (2 wheeled) Transfers: Sit to/from Stand Sit to Stand: Min guard;From elevated surface         General transfer comment: Pt able to initiate sit to stand transfer from elevated bed and required CGA for steadying while transitioning hands from bed to RW. Stand to sit with mod A as pt with poor eccentric control on descent.  Ambulation/Gait Ambulation/Gait assistance: Min assist Gait Distance (Feet): 20 Feet Assistive device: Rolling walker (2 wheeled) Gait Pattern/deviations: Step-through pattern;Decreased step length - right;Decreased step length - left Gait velocity: decreased   General Gait Details: Pt ambulated 20 feet using RW with CGA-min A for steadying. Pt with reciprocal gait pattern with decreased step lengths /decreased foot clearance bilaterally.  Stairs            Wheelchair Mobility    Modified Rankin (Stroke Patients Only)       Balance Overall balance assessment: Needs assistance Sitting-balance support: Feet supported;Single extremity supported Sitting balance-Leahy Scale: Good Sitting balance - Comments: no overt LOB while sitting EOB while holding onto bedrail with RUE   Standing balance support: Bilateral upper extremity supported;No upper extremity supported Standing balance-Leahy Scale: Fair Standing balance comment: Fair standing balance with and without BUE on RW; noted slight postural sway  Pertinent Vitals/Pain Pain Assessment: 0-10 Pain Score: 5  Pain Location: L hip; also reports cramping in BLE and states "I must be low on potassium" Pain Descriptors / Indicators: Sore;Aching;Cramping Pain Intervention(s):  Monitored during session;Limited activity within patient's tolerance;Repositioned    Home Living Family/patient expects to be discharged to:: Private residence Living Arrangements: Alone;Other (Comment) (has aide that comes 5x/wk from varying times) Available Help at Discharge: Family;Available PRN/intermittently;Personal care attendant Type of Home: Apartment Home Access: Level entry;Other (comment) (1 curb to go up)     Home Layout: One level Home Equipment: Walker - 4 wheels;Bedside commode;Grab bars - toilet;Grab bars - tub/shower;Wheelchair - power      Prior Function Level of Independence: Independent with assistive device(s)         Comments: Pt reports ambulating with rollator and increasing difficulty standing up from chair, however does not require assist. Pt states that he utilizes his wife's power w/c for ease of mobility as it is a "long distance to get to the bedroom".  Pt reports aide comes 5x/week and stays for varying times depending on what she is assisting with (often helps with laundry, chores, cooking, etc.); pt denies fall history     Hand Dominance   Dominant Hand: Right    Extremity/Trunk Assessment   Upper Extremity Assessment Upper Extremity Assessment: Generalized weakness (grossly 3+ to 4-/5 bilaterally)    Lower Extremity Assessment Lower Extremity Assessment: Generalized weakness (grossly 2 to 3/5 bilaterally)       Communication   Communication: HOH  Cognition Arousal/Alertness: Awake/alert Behavior During Therapy: WFL for tasks assessed/performed Overall Cognitive Status: Within Functional Limits for tasks assessed                                 General Comments: Pt awake and alert x 4.      General Comments      Exercises Other Exercises Other Exercises: active AP and heel slides bilaterally x 5-7 reps   Assessment/Plan    PT Assessment Patient needs continued PT services  PT Problem List Decreased  strength;Decreased activity tolerance;Decreased balance;Decreased mobility;Pain;Decreased skin integrity       PT Treatment Interventions DME instruction;Gait training;Functional mobility training;Therapeutic activities;Therapeutic exercise;Balance training;Stair training;Patient/family education    PT Goals (Current goals can be found in the Care Plan section)  Acute Rehab PT Goals Patient Stated Goal: to go home PT Goal Formulation: With patient Time For Goal Achievement: 07/26/20 Potential to Achieve Goals: Good Additional Goals Additional Goal #1: Pt will perform sit <> stand/stand pivot transfer with mod I and RW. (to return to PLOF)    Frequency Min 2X/week   Barriers to discharge Decreased caregiver support      Co-evaluation               AM-PAC PT "6 Clicks" Mobility  Outcome Measure Help needed turning from your back to your side while in a flat bed without using bedrails?: A Little Help needed moving from lying on your back to sitting on the side of a flat bed without using bedrails?: A Little Help needed moving to and from a bed to a chair (including a wheelchair)?: A Little Help needed standing up from a chair using your arms (e.g., wheelchair or bedside chair)?: A Little Help needed to walk in hospital room?: A Little Help needed climbing 3-5 steps with a railing? : A Little 6 Click Score: 18    End  of Session Equipment Utilized During Treatment: Gait belt Activity Tolerance: Patient limited by fatigue;Patient limited by pain Patient left: in chair;with call bell/phone within reach;with chair alarm set Nurse Communication: Mobility status PT Visit Diagnosis: Unsteadiness on feet (R26.81);Muscle weakness (generalized) (M62.81);Pain;Other abnormalities of gait and mobility (R26.89) Pain - Right/Left: Left Pain - part of body: Hip    Time: 7654-6503 PT Time Calculation (min) (ACUTE ONLY): 35 min   Charges:   PT Evaluation $PT Eval Moderate Complexity:  1 Mod PT Treatments $Therapeutic Exercise: 8-22 mins        Frederich Chick, SPT  Ashey Tramontana 07/12/2020, 2:28 PM

## 2020-07-12 NOTE — Progress Notes (Addendum)
ANTICOAGULATION CONSULT NOTE  Pharmacy Consult for Warfarin Indication: aortic stenosis s/p mechanical TAVR  Allergies  Allergen Reactions  . Sulfa Antibiotics Other (See Comments)    Reaction:  Unknown   . Levofloxacin Nausea Only    Patient Measurements: Height: 5\' 11"  (180.3 cm) Weight: 95.7 kg (211 lb) IBW/kg (Calculated) : 75.3  Vital Signs: Temp: 97.8 F (36.6 C) (08/18 0732) Temp Source: Oral (08/18 0732) BP: 121/61 (08/18 0732) Pulse Rate: 67 (08/18 0732)  Labs: Recent Labs    07/10/20 1201 07/10/20 1201 07/11/20 0020 07/11/20 0631 07/11/20 1452 07/12/20 0544  HGB 10.4*   < >  --  10.0*  --  9.5*  HCT 30.5*  --   --  30.6*  --  29.1*  PLT 212  --   --  209  --  193  LABPROT  --   --  29.5* 28.3*  --  33.5*  INR  --   --  2.9* 2.8*  --  3.4*  CREATININE 1.46*   < >  --  1.21 1.26* 1.41*   < > = values in this interval not displayed.    Estimated Creatinine Clearance: 44.4 mL/min (A) (by C-G formula based on SCr of 1.41 mg/dL (H)).   Medical History: Past Medical History:  Diagnosis Date  . Anxiety 10/11  . Aortic stenosis    a. s/p mechcanical AVR, 2002; b. 10/2018 Echo: Triv AI, mean grad 11/2018.  . Bradycardia    chronic, no symptoms 07/2010  . C. difficile colitis   . Carotid bruit    dopplers in past, no abnormalities  . Chronic diastolic CHF (congestive heart failure) (HCC)    a. Echo 03/2015: EF 60-65%, no RWMA, GR1DD, mild BAE, mild to mod MR, mod TR, PASP 65 mmHg; b. 01/2017 Echo: EF 55-60%, NRWMA, grade 1 diastolic dysfunction.  Normal functioning prosthetic aortic valve.  Mean gradient 50 mmHg.  Sev TR. PASP 02/2017; c. 10/2018 Echo: EF 55-60%, Triv AI, mod dil LA, mod-sev TR, PASP 35-40, mild to mod red RV fxn.  . Coronary artery disease    a. mild, cath, 08/2010; b. medically managed  . Decreased hearing    Right ear  . Depression   . Gastric ulcer   . GERD (gastroesophageal reflux disease)   . Hypertension    BP higher than usual  04/19/10; amlodipine increased by telephone  . Mod-Sev Tricuspid regurgitation    a. 10/2018 Echo: Mod-Sev TR, PASP 35-90mmHg.  43m RLS (restless legs syndrome) 08/23/2015  . S/P AVR    a. [employer]. mechanical 2002; b. echo 08/2010 EF 60%, trival AI, mild MR, AVR working well; c. on longterm warfarin tx  . SOB (shortness of breath) 10/11   08/2010,Episodes at 5 AM, eventually felt to be anxiety, after complete workup including catheterization, pt greatly improved with anxiety meds 11/11    Medications:  8/18 Spoke with Coumadin clinic to verify home regimen Warfarin 5mg  on Monday, Wednesday and Friday Warfarin 2.5mg  on all other days  Assessment: Pharmacy has been consulted for warfarin dosing and monitoring in an 86yo male admitted for suspected worsening cellulitis. PMH significant for aortic stenosis s/p mechanical TAVR (on Coumadin), chronic diastolic HF, HTN, and CAD.   Hgb is stable and platelets are WNL.   Per med rec, patients last dose of warfarin was 8/16 which would have been 5mg .   Patient has poor PO intake  Date INR Dose 8/17 2.8 2.5 mg 8/18 3.4   Goal of  Therapy:  INR 2.5-3.5 (per Coumadin clinic) Monitor platelets by anticoagulation protocol: Yes   Plan:  INR is therapeutic at 3.4, but has increased by 0.6 since yesterday.  Will give 1mg  tonight to avoid overcompensating Continue to monitor PT/INR and CBC.   , PharmD Pharmacy Resident  07/12/2020 7:52 AM

## 2020-07-12 NOTE — Telephone Encounter (Signed)
Sam from [facility] calling Would like a clarification on patient's warfarin dosage Please call to discuss at [phone]

## 2020-07-12 NOTE — Progress Notes (Signed)
[facility]  PROGRESS NOTE  [name] DGU:[id number] DOB: 1931/03/27 DOA: 07/10/2020 PCP: Gracelyn Nurse, MD   Brief HPI:   86 year old male with past medical history of aortic stenosis s/p mechanical valve replacement on Coumadin, chronic diastolic heart failure, hypertension, CAD, history of CVA presented with complaints of worsening lower extremity edema, redness and cellulitis.  Patient had lower extremity venous ablation performed in May by vascular surgery.  Since then he has been followed by wound care.  He was seen by wound care team and was recommended to go to ED for worsening erythema suspicious for cellulitis.    Subjective   Patient seen and examined, complains of sharp pain in the lower extremities.  He has history of neuropathy.   Assessment/Plan:     1. Lower extremity cellulitis-superimposed on chronic venous insufficiency-wound care consulted, patient on IV vancomycin.  Continue with compression wraps. 2. Aortic stenosis s/p mechanical TAVR-continue anticoagulation with Coumadin. 3. Acute on chronic diastolic CHF-patient presented with worsening shortness of breath and lower extremity edema.  Cardiology was consulted.  Currently patient on IV Lasix 40 mg daily, metolazone twice a week.  Potassium is 3.0 will replace potassium and follow BMP in am. 4. Restless leg syndrome-continue Requip 5. CKD stage IIIb-patient creatinine 1.4.  Continue with IV Lasix. 6. Atrial fibrillation-continue metoprolol, Coumadin 7. Peripheral neuropathy-we will switch from citalopram to duloxetine 30 mg daily.     COVID-19 Labs  No results for input(s): DDIMER, FERRITIN, LDH, CRP in the last 72 hours.  Lab Results  Component Value Date   SARSCOV2NAA NEGATIVE 07/10/2020   SARSCOV2NAA Detected (A) 08/30/2019     Scheduled medications:   . allopurinol  300 mg Oral Daily  . cloNIDine  0.1 mg Oral BID  . doxazosin  8 mg Oral BID  . DULoxetine  30 mg Oral Daily  .  finasteride  5 mg Oral Daily  . furosemide  40 mg Intravenous Daily  . isosorbide mononitrate  60 mg Oral BID  . loratadine  10 mg Oral Daily  . [START ON 07/13/2020] metolazone  2.5 mg Oral Once per day on Mon Thu  . metoprolol succinate  12.5 mg Oral Daily  . potassium chloride SA  30 mEq Oral Daily  . rOPINIRole  2 mg Oral QID  . tamsulosin  0.4 mg Oral QHS  . traZODone  50 mg Oral QHS  . Warfarin - Pharmacist Dosing Inpatient   Does not apply q1600      CBG: No results for input(s): GLUCAP in the last 168 hours.  SpO2: 97 %    CBC: Recent Labs  Lab 07/10/20 1201 07/11/20 0631 07/12/20 0544  WBC 9.2 8.6 7.4  NEUTROABS 7.4  --   --   HGB 10.4* 10.0* 9.5*  HCT 30.5* 30.6* 29.1*  MCV 85.9 88.7 89.3  PLT 212 209 193    Basic Metabolic Panel: Recent Labs  Lab 07/06/20 1037 07/10/20 1201 07/11/20 0631 07/11/20 1452 07/12/20 0544  NA 133* 133* 135 132* 133*  K 3.0* 3.5 2.8* 3.1* 3.0*  CL 91* 89* 90* 92* 90*  CO2 29 31 31 28 30   GLUCOSE 102* 105* 113* 125* 97  BUN 43* 44* 43* 47* 44*  CREATININE 1.48* 1.46* 1.21 1.26* 1.40*  1.41*  CALCIUM 8.5* 8.9 8.6* 8.4* 8.8*  MG  --   --  2.3  --   --      Liver Function Tests: Recent Labs  Lab 07/10/20 1201  AST  22  ALT 10  ALKPHOS 108  BILITOT 1.9*  PROT 7.1  ALBUMIN 3.8     Antibiotics: Anti-infectives (From admission, onward)   Start     Dose/Rate Route Frequency Ordered Stop   07/11/20 1300  vancomycin (VANCOREADY) IVPB 1250 mg/250 mL  Status:  Discontinued        1,250 mg 166.7 mL/hr over 90 Minutes Intravenous Every 24 hours 07/11/20 0216 07/11/20 0836   07/11/20 1000  vancomycin (VANCOREADY) IVPB 1500 mg/300 mL     Discontinue     1,500 mg 150 mL/hr over 120 Minutes Intravenous Every 24 hours 07/11/20 0836     07/10/20 2215  vancomycin (VANCOCIN) IVPB 1000 mg/200 mL premix        1,000 mg 200 mL/hr over 60 Minutes Intravenous  Once 07/10/20 2207 07/11/20 0133       DVT prophylaxis:  Coumadin  Code Status: Full code  Family Communication: Discussed with patient's daughter at bedside    Status is: Inpatient  Dispo: The patient is from: Home              Anticipated d/c is to: Home              Anticipated d/c date is: 07/13/2020              Patient currently not medically stable for discharge  Barrier to discharge-ongoing treatment for right lower extremity cellulitis.          Consultants:    Procedures:     Objective   Vitals:   07/11/20 0812 07/11/20 2054 07/11/20 2317 07/12/20 0732  BP: 112/72 (!) 121/94 124/74 121/61  Pulse: 72 66 (!) 55 67  Resp: 19 19 16 18   Temp: 97.8 F (36.6 C) 98.2 F (36.8 C) 98.4 F (36.9 C) 97.8 F (36.6 C)  TempSrc: Oral Oral  Oral  SpO2: 95% 99% 96% 97%  Weight:      Height:        Intake/Output Summary (Last 24 hours) at 07/12/2020 1847 Last data filed at 07/12/2020 1015 Gross per 24 hour  Intake 240 ml  Output 550 ml  Net -310 ml    08/16 1901 - 08/18 0700 In: 420 [P.O.:120] Out: 1650 [Urine:1650]  Filed Weights   07/10/20 1200 07/11/20 0129  Weight: 101 kg 95.7 kg    Physical Examination:    General: Appears in no acute distress  Cardiovascular: S1-S2, regular, no murmur auscultated  Respiratory: Clear to auscultation bilaterally  Abdomen-soft, nontender, no organomegaly  Extremities: Bilateral lower extremity in dressing  Neurologic: Alert, oriented x3, intact insight and judgment    Data Reviewed:   Recent Results (from the past 240 hour(s))  Blood culture (routine x 2)     Status: None (Preliminary result)   Collection Time: 07/10/20 11:21 PM   Specimen: BLOOD  Result Value Ref Range Status   Specimen Description BLOOD LEFT ANTECUBITAL  Final   Special Requests   Final    BOTTLES DRAWN AEROBIC AND ANAEROBIC Blood Culture results may not be optimal due to an excessive volume of blood received in culture bottles   Culture   Final    NO GROWTH 2 DAYS Performed at  [facility], [address] [place], [place]    Report Status PENDING  Incomplete  Blood culture (routine x 2)     Status: None (Preliminary result)   Collection Time: 07/10/20 11:21 PM   Specimen: BLOOD  Result Value Ref Range  Status   Specimen Description BLOOD RIGHT ANTECUBITAL  Final   Special Requests   Final    BOTTLES DRAWN AEROBIC AND ANAEROBIC Blood Culture adequate volume   Culture   Final    NO GROWTH 2 DAYS Performed at [facility], [address], [place], [place] [address]    Report Status PENDING  Incomplete  SARS Coronavirus 2 by RT PCR (hospital order, performed in [facility] hospital lab) Nasopharyngeal Nasopharyngeal Swab     Status: None   Collection Time: 07/10/20 11:21 PM   Specimen: Nasopharyngeal Swab  Result Value Ref Range Status   SARS Coronavirus 2 NEGATIVE NEGATIVE Final    Comment: (NOTE) SARS-CoV-2 target nucleic acids are NOT DETECTED.  The SARS-CoV-2 RNA is generally detectable in upper and lower respiratory specimens during the acute phase of infection. The lowest concentration of SARS-CoV-2 viral copies this assay can detect is 250 copies / mL. A negative result does not preclude SARS-CoV-2 infection and should not be used as the sole basis for treatment or other patient management decisions.  A negative result may occur with improper specimen collection / handling, submission of specimen other than nasopharyngeal swab, presence of viral mutation(s) within the areas targeted by this assay, and inadequate number of viral copies (<250 copies / mL). A negative result must be combined with clinical observations, patient history, and epidemiological information.  Fact Sheet for Patients:   BoilerBrush.com.cy  Fact Sheet for Healthcare Providers: https://pope.com/  This test is not yet approved or  cleared by the [place] FDA and has been authorized for  detection and/or diagnosis of SARS-CoV-2 by FDA under an Emergency Use Authorization (EUA).  This EUA will remain in effect (meaning this test can be used) for the duration of the COVID-19 declaration under Section 564(b)(1) of the Act, 21 U.S.C. section 360bbb-3(b)(1), unless the authorization is terminated or revoked sooner.  Performed at [facility], [address] [place], [place] [address]     BNP (last 3 results) Recent Labs    07/11/20 0631  BNP 326.8*     Studies:  DG Chest Port 1 View  Result Date: 07/11/2020 CLINICAL DATA:  Dyspnea. History of coronary artery disease and aortic valve replacement. Progressive cellulitis. EXAM: PORTABLE CHEST 1 VIEW COMPARISON:  Two-view chest x-ray 06/26/2020 FINDINGS: Heart is enlarged. Valve replacement noted. CABG noted. Increasing interstitial pattern is present. Bibasilar opacities likely reflect atelectasis. IMPRESSION: 1. Cardiomegaly with increasing interstitial pattern compatible with congestive heart failure. 2. Bibasilar atelectasis. Electronically Signed   By: Marin Roberts M.D.   On: 07/11/2020 09:47       Dezeray Puccio S Abayomi Pattison   [facility] If 7PM-7AM, please contact night-coverage at [email or web address], Office  [phone]   07/12/2020, 6:47 PM  LOS: 1 day

## 2020-07-12 NOTE — TOC Initial Note (Signed)
Transition of Care [facility]) - Initial/Assessment Note    Patient Details  Name: [name] MRN: [id number] Date of Birth: 01-26-1931  [facility]) CM/SW Contact:    Shelbie Ammons, RN Phone Number: 07/12/2020, 2:47 PM  Clinical Narrative:    RNCM met with patient in room and was joined shortly after by patient's daughter. Patient lives in the independent apartments at [place]. Discussed discharge recommendations that he go to SNF when he leaves the hospital. Patient is agreeable to this plan but reports he does not want to stay anywhere for long. Daughter in agreement with all plans above and reports that patient has had to go the SNF facility in the past. RNCM verified PASSR, completed FL2 and sent for signature and sent bed request through the hub to [facility].         Expected Discharge Plan: Skilled Nursing Facility Barriers to Discharge: No Barriers Identified   Patient Goals and CMS Choice   CMS Medicare.gov Compare Post Acute Care list provided to:: Patient Choice offered to / list presented to : Patient  Expected Discharge Plan and Services Expected Discharge Plan: [place]   Discharge Planning Services: CM Consult Post Acute Care Choice: [place] Living arrangements for the past 2 months: Apartment                                      Prior Living Arrangements/Services Living arrangements for the past 2 months: Apartment Lives with:: Spouse Patient language and need for interpreter reviewed:: Yes Do you feel safe going back to the place where you live?: Yes      Need for Family Participation in Patient Care: Yes (Comment) Care giver support system in place?: Yes (comment)   Criminal Activity/Legal Involvement Pertinent to Current Situation/Hospitalization: No - Comment as needed  Activities of Daily Living Home Assistive Devices/Equipment: Walker (specify type) ADL Screening (condition at time  of admission) Patient's cognitive ability adequate to safely complete daily activities?: Yes Is the patient deaf or have difficulty hearing?: No (not if aides are in place) Does the patient have difficulty seeing, even when wearing glasses/contacts?: No Does the patient have difficulty concentrating, remembering, or making decisions?: No Patient able to express need for assistance with ADLs?: Yes Does the patient have difficulty dressing or bathing?: Yes (baseline) Independently performs ADLs?: No Communication: Independent Dressing (OT): Needs assistance Is this a change from baseline?: Change from baseline, expected to last <3days Grooming: Needs assistance Is this a change from baseline?: Change from baseline, expected to last <3 days Feeding: Independent Bathing: Needs assistance Is this a change from baseline?: Change from baseline, expected to last <3 days Toileting: Needs assistance Is this a change from baseline?: Change from baseline, expected to last <3 days In/Out Bed: Needs assistance Is this a change from baseline?: Change from baseline, expected to last <3 days Walks in Home: Independent (with walker) Does the patient have difficulty walking or climbing stairs?: Yes (baseline) Weakness of Legs: Both Weakness of Arms/Hands: Both  Permission Sought/Granted                  Emotional Assessment Appearance:: Appears stated age Attitude/Demeanor/Rapport: Engaged Affect (typically observed): Appropriate, Calm Orientation: : Oriented to Self, Oriented to Place, Oriented to  Time, Oriented to Situation Alcohol / Substance Use: Not Applicable Psych Involvement: No (comment)  Admission diagnosis:  Cellulitis [L03.90] Peripheral  edema [R60.9] Cellulitis of lower extremity, unspecified laterality [Y56.943] Patient Active Problem List   Diagnosis Date Noted  . AKI (acute kidney injury) ([place]) 07/11/2020  . Acute on chronic heart failure with preserved ejection fraction  (HFpEF) ([place])   . Permanent atrial fibrillation ([place])   . Cellulitis 07/10/2020  . 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 Varicose veins of leg with swelling, right 02/08/2020  . Varicose veins of left lower extremity with ulcer of calf ([place]) 11/09/2019  . Greater trochanteric pain syndrome 11/02/2019  . Lower limb ulcer, ankle, left, limited to breakdown of skin ([place]) 11/02/2019  . H/O atrial flutter 07/15/2018  . Obstructive sleep apnea 10/23/2017  . Chronic diastolic CHF (congestive heart failure) ([place]) 09/24/2017  . Lymphedema 09/24/2017  . Chronic hyponatremia 08/12/2017  . Encounter for anticoagulation discussion and counseling 02/24/2017  . Bilateral leg edema 09/25/2015  . RLS (restless legs syndrome) 08/23/2015  . C. difficile colitis 05/26/2015  . Blood loss anemia   . History of mechanical aortic valve replacement   . Anemia 04/02/2015  . GERD (gastroesophageal reflux disease)   . Bradycardia   . Depression   . Anxiety   . Hypertension   . Decreased hearing   . Coronary artery disease   . Aortic stenosis   . Warfarin anticoagulation   . Carotid bruit    PCP:  Baxter Hire, MD Pharmacy:   [id number] [place]: [phone]: [phone] [id number] [place]: [phone]: [phone] [place]one: [phone]: [phone]   Social Determinants of Health (SD[place]oorheadInterventions    Readmission Risk Interventions Readmission Risk Prevention Plan 07/12/2020  Transportation Screening Complete  PCP or Specialist Appt within 3-5 Days Complete  Social Work Consult for [place]/Counseling Complete  Palliative Care Screening Not Applicable   Medication Review (RPress photographerComplete  Some recent data might be hidden

## 2020-07-12 NOTE — NC FL2 (Signed)
[place] MEDICAID FL2 LEVEL OF CARE SCREENING TOOL     IDENTIFICATION  Patient Name: [name] Birthdate: Jan 07, 1931 Sex: male Admission Date (Current Location): 07/10/2020  [place] and [place] Number:  Chiropodist and Address:  [facility], [address], [place], [place] [address]      Provider Number: [id number]  Attending Physician Name and Address:  Meredeth Ide, MD  Relative Name and Phone Number:  Prudencio Burly [phone]    Current Level of Care: Hospital Recommended Level of Care: Skilled Nursing Facility Prior Approval Number:    Date Approved/Denied:   PASRR Number: [id number]  Discharge Plan: SNF    Current Diagnoses: Patient Active Problem List   Diagnosis Date Noted  . AKI (acute kidney injury) ([facility]) 07/11/2020  . Acute on chronic heart failure with preserved ejection fraction (HFpEF) ([facility])   . Permanent atrial fibrillation ([facility])   . Cellulitis 07/10/2020  . 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 Varicose veins of leg with swelling, right 02/08/2020  . 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 Greater trochanteric pain syndrome 11/02/2019  . Lower limb ulcer, ankle, left, limited to breakdown of skin ([facility]) 11/02/2019  . H/O atrial flutter 07/15/2018  . Obstructive sleep apnea 10/23/2017  . Chronic diastolic CHF (congestive heart failure) ([facility]) 09/24/2017  . Lymphedema 09/24/2017  . Chronic hyponatremia 08/12/2017  . Encounter for anticoagulation discussion and counseling 02/24/2017  . Bilateral leg edema 09/25/2015  . RLS (restless legs syndrome) 08/23/2015  . C. difficile colitis 05/26/2015  . Blood loss anemia   . History of mechanical aortic valve replacement   . Anemia 04/02/2015  . GERD (gastroesophageal reflux disease)   . Bradycardia   . Depression   . Anxiety   . Hypertension   . Decreased hearing   . Coronary artery disease   .  Aortic stenosis   . Warfarin anticoagulation   . Carotid bruit     Orientation RESPIRATION BLADDER Height & Weight     Self, Time, Situation, Place  Normal Continent Weight: 95.7 kg Height:  5\' 11"  (180.3 cm)  BEHAVIORAL SYMPTOMS/MOOD NEUROLOGICAL BOWEL NUTRITION STATUS      Continent Diet (Heart Healthy)  AMBULATORY STATUS COMMUNICATION OF NEEDS Skin   Limited Assist Verbally Other (Comment) (Blisters, Cellulitis)                       Personal Care Assistance Level of Assistance  Bathing, Feeding, Dressing Bathing Assistance: Limited assistance Feeding assistance: Independent Dressing Assistance: Limited assistance     Functional Limitations Info  Speech, Hearing, Sight Sight Info: Adequate Hearing Info: Impaired Speech Info: Adequate    SPECIAL CARE FACTORS FREQUENCY  PT (By licensed PT), OT (By licensed OT)                    Contractures Contractures Info: Not present    Additional Factors Info  Code Status, Allergies Code Status Info: Full Allergies Info: Sulfa, Levaquin           Current Medications (07/12/2020):  This is the current hospital active medication list Current Facility-Administered Medications  Medication Dose Route Frequency Provider Last Rate Last Admin  . acetaminophen (TYLENOL) tablet 1,000 mg  1,000 mg Oral Q6H PRN Regalado, Belkys A, MD   1,000 mg at 07/11/20 2106  . allopurinol (ZYLOPRIM) tablet 300 mg  300 mg Oral Daily Tu, Ching T, DO  300 mg at 07/12/20 0919  . cloNIDine (CATAPRES) tablet 0.1 mg  0.1 mg Oral BID Tu, Ching T, DO   0.1 mg at 07/12/20 0903  . doxazosin (CARDURA) tablet 8 mg  8 mg Oral BID Tu, Ching T, DO   8 mg at 07/12/20 0917  . DULoxetine (CYMBALTA) DR capsule 30 mg  30 mg Oral Daily Meredeth Ide, MD      . finasteride (PROSCAR) tablet 5 mg  5 mg Oral Daily Tu, Ching T, DO   5 mg at 07/12/20 0916  . furosemide (LASIX) injection 40 mg  40 mg Intravenous Daily Tu, Ching T, DO   40 mg at 07/12/20 0904  .  isosorbide mononitrate (IMDUR) 24 hr tablet 60 mg  60 mg Oral BID Tu, Ching T, DO   60 mg at 07/12/20 0902  . loratadine (CLARITIN) tablet 10 mg  10 mg Oral Daily Tu, Ching T, DO   10 mg at 07/12/20 0902  . [START ON 07/13/2020] metolazone (ZAROXOLYN) tablet 2.5 mg  2.5 mg Oral Once per day on Mon Thu Tu, Ching T, DO      . metoprolol succinate (TOPROL-XL) 24 hr tablet 12.5 mg  12.5 mg Oral Daily Tu, Ching T, DO   12.5 mg at 07/12/20 0903  . potassium chloride (KLOR-CON) CR tablet 30 mEq  30 mEq Oral Once [place], Sarina Ill, MD      . potassium chloride SA (KLOR-CON) CR tablet 30 mEq  30 mEq Oral Daily Tu, Ching T, DO   30 mEq at 07/12/20 0902  . rOPINIRole (REQUIP) tablet 2 mg  2 mg Oral QID Tu, Ching T, DO   2 mg at 07/12/20 0902  . tamsulosin (FLOMAX) capsule 0.4 mg  0.4 mg Oral QHS Tu, Ching T, DO   0.4 mg at 07/11/20 2055  . traMADol (ULTRAM) tablet 50 mg  50 mg Oral Q6H PRN Manuela Schwartz, NP   50 mg at 07/12/20 1047  . traZODone (DESYREL) tablet 50 mg  50 mg Oral QHS Tu, Ching T, DO   50 mg at 07/11/20 2056  . vancomycin (VANCOREADY) IVPB 1500 mg/300 mL  1,500 mg Intravenous Q24H Rauer, Robyne Peers, RPH 150 mL/hr at 07/11/20 0958 1,500 mg at 07/11/20 0958  . warfarin (COUMADIN) tablet 1 mg  1 mg Oral ONCE-1600 Rauer, Robyne Peers, RPH      . Warfarin - Pharmacist Dosing Inpatient   Does not apply q1600 Rauer, Robyne Peers, [facility]         Discharge Medications: Please see discharge summary for a list of discharge medications.  Relevant Imaging Results:  Relevant Lab Results:   Additional Information SS# [id number]  Trenton Founds, RN

## 2020-07-12 NOTE — Telephone Encounter (Signed)
Patients daughter calling in stating she spoke with Dr. Mariah Milling this morning and he advised her she may call in to get an update from his nurse. Please advise when able

## 2020-07-12 NOTE — Telephone Encounter (Signed)
Spoke w/ Sam & clarified pt's previous dosage on 07/03/20 of 2.5 mg every day except 5 mg on M,W,F.

## 2020-07-12 NOTE — Progress Notes (Signed)
Progress Note  Patient Name: [name] Date of Encounter: 07/12/2020  Primary Cardiologist: Mariah Milling  Subjective   No chest pain or dyspnea. He continues to note lower extremity pain. SCr trending up. HGB 10.0-->9.5.  Inpatient Medications    Scheduled Meds: . allopurinol  300 mg Oral Daily  . citalopram  20 mg Oral Daily  . cloNIDine  0.1 mg Oral BID  . doxazosin  8 mg Oral BID  . finasteride  5 mg Oral Daily  . furosemide  40 mg Intravenous Daily  . isosorbide mononitrate  60 mg Oral BID  . loratadine  10 mg Oral Daily  . [START ON 07/13/2020] metolazone  2.5 mg Oral Once per day on Mon Thu  . metoprolol succinate  12.5 mg Oral Daily  . potassium chloride SA  30 mEq Oral Daily  . rOPINIRole  2 mg Oral QID  . tamsulosin  0.4 mg Oral QHS  . traZODone  50 mg Oral QHS  . Warfarin - Pharmacist Dosing Inpatient   Does not apply q1600   Continuous Infusions: . vancomycin 1,500 mg (07/11/20 0958)   PRN Meds: acetaminophen, traMADol   Vital Signs    Vitals:   07/11/20 0812 07/11/20 2054 07/11/20 2317 07/12/20 0732  BP: 112/72 (!) 121/94 124/74 121/61  Pulse: 72 66 (!) 55 67  Resp: 19 19 16 18   Temp: 97.8 F (36.6 C) 98.2 F (36.8 C) 98.4 F (36.9 C) 97.8 F (36.6 C)  TempSrc: Oral Oral  Oral  SpO2: 95% 99% 96% 97%  Weight:      Height:        Intake/Output Summary (Last 24 hours) at 07/12/2020 0901 Last data filed at 07/12/2020 0208 Gross per 24 hour  Intake 420 ml  Output 1250 ml  Net -830 ml   Filed Weights   07/10/20 1200 07/11/20 0129  Weight: 101 kg 95.7 kg    Telemetry    Afib - Personally Reviewed  ECG    Afib, 69 bpm, nonspecific IVCD, poor R wave progression along the precordial leads, no acute st/t changes - Personally Reviewed  Physical Exam   GEN: No acute distress.   Neck: No JVD. Cardiac: Irregularly irregular, mechanical murmur noted, no rubs, or gallops.  Respiratory: Mildly diminished bilaterally.  GI: Soft,  nontender, non-distended.   MS: Lower extremities wrapped with notable TTP and mild erythema superiorly. Trace pitting edema along the bilateral thighs; No deformity. Neuro:  Alert and oriented x 3; Nonfocal.  Psych: Normal affect.  Labs    Chemistry Recent Labs  Lab 07/10/20 1201 07/10/20 1201 07/11/20 0631 07/11/20 1452 07/12/20 0544  NA 133*  --  135 132*  --   K 3.5  --  2.8* 3.1*  --   CL 89*  --  90* 92*  --   CO2 31  --  31 28  --   GLUCOSE 105*  --  113* 125*  --   BUN 44*  --  43* 47*  --   CREATININE 1.46*   < > 1.21 1.26* 1.41*  CALCIUM 8.9  --  8.6* 8.4*  --   PROT 7.1  --   --   --   --   ALBUMIN 3.8  --   --   --   --   AST 22  --   --   --   --   ALT 10  --   --   --   --  ALKPHOS 108  --   --   --   --   BILITOT 1.9*  --   --   --   --   GFRNONAA 43*   < > 54* 51* 45*  GFRAA 50*   < > >60 59* 52*  ANIONGAP 13  --  14 12  --    < > = values in this interval not displayed.     Hematology Recent Labs  Lab 07/10/20 1201 07/11/20 0631 07/12/20 0544  WBC 9.2 8.6 7.4  RBC 3.55* 3.45* 3.26*  HGB 10.4* 10.0* 9.5*  HCT 30.5* 30.6* 29.1*  MCV 85.9 88.7 89.3  MCH 29.3 29.0 29.1  MCHC 34.1 32.7 32.6  RDW 15.8* 15.8* 15.9*  PLT 212 209 193    Cardiac EnzymesNo results for input(s): TROPONINI in the last 168 hours. No results for input(s): TROPIPOC in the last 168 hours.   BNP Recent Labs  Lab 07/11/20 0631  BNP 326.8*     DDimer No results for input(s): DDIMER in the last 168 hours.   Radiology    DG Chest Port 1 View  Result Date: 07/11/2020 IMPRESSION: 1. Cardiomegaly with increasing interstitial pattern compatible with congestive heart failure. 2. Bibasilar atelectasis. Electronically Signed   By: Marin Roberts M.D.   On: 07/11/2020 09:47    Cardiac Studies   2D echo 02/11/2020: 1. Left ventricular ejection fraction, by estimation, is 55 to 60%. The  left ventricle has normal function. The left ventricle has no regional  wall  motion abnormalities. Left ventricular diastolic function could not  be evaluated.  2. Right ventricular systolic function is normal. The right ventricular  size is mildly enlarged.  3. Left atrial size was severely dilated.  4. Right atrial size was severely dilated.  5. The mitral valve is degenerative. Mild mitral valve regurgitation.  6. Tricuspid valve regurgitation is moderate to severe.  7. The aortic valve has been repaired/replaced. Aortic valve  regurgitation is not visualized. There is a mechanical valve present in  the aortic position. Procedure Date: 2002. Echo findings are consistent  with normal structure and function of the aortic  valve prosthesis.   Patient Profile     86 y.o. male with history of nonobstructive CAD by LHC in 2011, permanent A. fib, aortic stenosis status post SJM mechanical AVR in 2002 on Coumadin, HFpEF, HTN, HLD, GI bleed, chronic venous stasis with venous insufficiency followed by vascular surgery, chronic dyspnea, RLS, OSA on CPAP, anxiety, depression, and GERD who is being seen today for the evaluation of acute on chronic HFpEF at the request of Dr. Sunnie Nielsen.  Assessment & Plan    1. Acute on chronic HFpEF/venous insufficiency:  -Approaching euvolemia   -Documented this weight approximately 209 pounds with the patient reported dry weight of approximately 200 pounds  -He drinks large amounts of tea and Coke daily which is likely contributing to his overall presentation  -It does appear there is a very component of venous stasis with dependent edema contributing to his lower extremity swelling but recommendation to continue wraps and leg elevation  -He has already received IV Lasix 40 mg this morning with likely transition to oral diuretic 8/19 with twice weekly metolazone for now with close monitoring of renal function and potassium  -ReDs vest 37% 8/17, trend on 8/19 -Pending clinical progression could consider RHC for further estimation of  volume status however, this would require holding his Coumadin with heparin bridge given A. fib and mechanical AVR., For  now we will continue him on PTA Coumadin and follow him clinically  -Recommend he follow up with vascular surgery for consideration of lymphedema pumps to assist with his dependent edema -As an outpatient, could consider cardiac MRI if mechanical AVR status allows  -Daily weights  -Strict I's and O's  -CHF education   2. Nonobstructive CAD:  -No symptoms concerning for angina  -On Coumadin in place of aspirin  -Continue PTA Imdur   3. Permanent A. fib:  -Ventricular rates well controlled   -Continue Toprol-XL  -CHADS2VASc 5 (CHF, HTN, age x 2, vascular disease)  -Continue Coumadin with goal INR being 2.5-3.5 in the setting of permanent A. fib with mechanical AVR   4. History of mechanical AVR:  -Coumadin as above  -Recent echo in 01/2020 demonstrating normal prosthetic valve function  -Coumadin per pharmacy   5. AKI:  -Renal function with a slight bump this morning, indicating he is approaching euvolemia  -He has already received IV Lasix 40 mg this morning, with likely transition back to oral on 8/19  -Continue to monitor   6. Hypokalemia:  -Replete potassium to goal 4.0, will given an additional 30 mEq of KCl at noon today in addition to his scheduled 30 mEq KCl this morning  -Magnesium at goal  -Check BMP this morning  7. HTN:  -Blood pressure well controlled  -Continue current regimen   8. Anemia:  -Stable   9. OSA:  -Recommend CPAP use while in the hospital  For questions or updates, please contact [facility] Please consult [email or web address] for contact info under Cardiology/STEMI.    Signed, Eula Listen, PA-C [facility] Pager: [phone] 07/12/2020, 9:01 AM

## 2020-07-13 ENCOUNTER — Encounter
Admission: RE | Admit: 2020-07-13 | Discharge: 2020-07-13 | Disposition: A | Discharge status: Home / Self Care | Payer: Medicare Other | Source: Ambulatory Visit | Attending: Internal Medicine | Admitting: Internal Medicine

## 2020-07-13 ENCOUNTER — Ambulatory Visit: Payer: Medicare Other | Admitting: Family

## 2020-07-13 DIAGNOSIS — R609 Edema, unspecified: Secondary | ICD-10-CM

## 2020-07-13 LAB — PROTIME-INR
INR: 3.6 — ABNORMAL HIGH (ref 0.8–1.2)
Prothrombin Time: 35.1 seconds — ABNORMAL HIGH (ref 11.4–15.2)

## 2020-07-13 LAB — BASIC METABOLIC PANEL
Anion gap: 11 (ref 5–15)
BUN: 42 mg/dL — ABNORMAL HIGH (ref 8–23)
CO2: 30 mmol/L (ref 22–32)
Calcium: 8.8 mg/dL — ABNORMAL LOW (ref 8.9–10.3)
Chloride: 94 mmol/L — ABNORMAL LOW (ref 98–111)
Creatinine, Ser: 1.17 mg/dL (ref 0.61–1.24)
GFR calc Af Amer: >60 mL/min (ref >60)
GFR calc non Af Amer: 56 mL/min — ABNORMAL LOW (ref >60)
Glucose, Bld: 105 mg/dL — ABNORMAL HIGH (ref 70–99)
Potassium: 4.1 mmol/L (ref 3.5–5.1)
Sodium: 135 mmol/L (ref 135–145)

## 2020-07-13 NOTE — Progress Notes (Addendum)
Pharmacy Antibiotic Note  [name] is a 86 y.o. male admitted on 07/10/2020 with cellulitis.  Pharmacy has been consulted for Vancomycin dosing. Patient did not receive a full loading dose so maintenance dose was started early.   Renal function remains stable.  Current dose is still appropriate based on weight and renal function.  Plan: Continue Vancomycin 1500mg  IV Q24 hrs per dosing nomogram --Continue to monitor renal function and order vanc level as indicated   Height: 5\' 11"  (180.3 cm) Weight: 95.7 kg (211 lb) IBW/kg (Calculated) : 75.3  Temp (24hrs), Avg:97.8 F (36.6 C), Min:97.5 F (36.4 C), Max:98.1 F (36.7 C)  Recent Labs  Lab 07/10/20 1201 07/11/20 0631 07/11/20 1452 07/12/20 0544 07/13/20 0501  WBC 9.2 8.6  --  7.4  --   CREATININE 1.46* 1.21 1.26* 1.40*  1.41* 1.17  LATICACIDVEN 1.0  --   --   --   --     Estimated Creatinine Clearance: 53.5 mL/min (by C-G formula based on SCr of 1.17 mg/dL).    Allergies  Allergen Reactions  . Sulfa Antibiotics Other (See Comments)    Reaction:  Unknown   . Levofloxacin Nausea Only    Antimicrobials this admission: 8/17 Vancomycin >>   Dose adjustments this admission: 8/17 Vancomycin increased from 1250mg  q24h to 1500mg  q24h based on improved renal function  Microbiology results: 8/16 BCx: NGTD  Thank you for allowing pharmacy to be a part of this patient's care.  9/17, PharmD Pharmacy Resident  07/13/2020 8:08 AM

## 2020-07-13 NOTE — Progress Notes (Addendum)
ANTICOAGULATION CONSULT NOTE  Pharmacy Consult for Warfarin Indication: aortic stenosis s/p mechanical TAVR  Allergies  Allergen Reactions  . Sulfa Antibiotics Other (See Comments)    Reaction:  Unknown   . Levofloxacin Nausea Only    Patient Measurements: Height: 5\' 11"  (180.3 cm) Weight: 95.7 kg (211 lb) IBW/kg (Calculated) : 75.3  Vital Signs: Temp: 97.5 F (36.4 C) (08/19 0739) Temp Source: Oral (08/19 0739) BP: 153/70 (08/19 0739) Pulse Rate: 66 (08/19 0739)  Labs: Recent Labs    07/10/20 1201 07/11/20 0020 07/11/20 0631 07/11/20 0631 07/11/20 1452 07/12/20 0544 07/13/20 0501  HGB 10.4*  --  10.0*  --   --  9.5*  --   HCT 30.5*  --  30.6*  --   --  29.1*  --   PLT 212  --  209  --   --  193  --   LABPROT  --    < > 28.3*  --   --  33.5* 35.1*  INR  --    < > 2.8*  --   --  3.4* 3.6*  CREATININE 1.46*  --  1.21   < > 1.26* 1.40*  1.41* 1.17   < > = values in this interval not displayed.    Estimated Creatinine Clearance: 53.5 mL/min (by C-G formula based on SCr of 1.17 mg/dL).   Medical History: Past Medical History:  Diagnosis Date  . Anxiety 10/11  . Aortic stenosis    a. s/p mechcanical AVR, 2002; b. 10/2018 Echo: Triv AI, mean grad 11/2018.  . Bradycardia    chronic, no symptoms 07/2010  . C. difficile colitis   . Carotid bruit    dopplers in past, no abnormalities  . Chronic diastolic CHF (congestive heart failure) (HCC)    a. Echo 03/2015: EF 60-65%, no RWMA, GR1DD, mild BAE, mild to mod MR, mod TR, PASP 65 mmHg; b. 01/2017 Echo: EF 55-60%, NRWMA, grade 1 diastolic dysfunction.  Normal functioning prosthetic aortic valve.  Mean gradient 50 mmHg.  Sev TR. PASP 02/2017; c. 10/2018 Echo: EF 55-60%, Triv AI, mod dil LA, mod-sev TR, PASP 35-40, mild to mod red RV fxn.  . Coronary artery disease    a. mild, cath, 08/2010; b. medically managed  . Decreased hearing    Right ear  . Depression   . Gastric ulcer   . GERD (gastroesophageal reflux disease)    . Hypertension    BP higher than usual 04/19/10; amlodipine increased by telephone  . Mod-Sev Tricuspid regurgitation    a. 10/2018 Echo: Mod-Sev TR, PASP 35-45mmHg.  43m RLS (restless legs syndrome) 08/23/2015  . S/P AVR    a. [employer]. mechanical 2002; b. echo 08/2010 EF 60%, trival AI, mild MR, AVR working well; c. on longterm warfarin tx  . SOB (shortness of breath) 10/11   08/2010,Episodes at 5 AM, eventually felt to be anxiety, after complete workup including catheterization, pt greatly improved with anxiety meds 11/11    Medications:  8/18 Spoke with Coumadin clinic to verify home regimen Warfarin 5mg  on Monday, Wednesday and Friday Warfarin 2.5mg  on all other days  Assessment: Pharmacy has been consulted for warfarin dosing and monitoring in an 86yo male admitted for suspected worsening cellulitis. PMH significant for aortic stenosis s/p mechanical TAVR (on Coumadin), chronic diastolic HF, HTN, and CAD.   Hgb is stable and platelets are WNL.   Per med rec, patients last dose of warfarin was 8/16 which would have been 5mg .  Patient has poor PO intake  Date INR Dose 8/17 2.8 2.5 mg 8/18 3.4 1 mg 8/19 3.6   Goal of Therapy:  INR 2.5-3.5 (per Coumadin clinic) Monitor platelets by anticoagulation protocol: Yes   Plan:  INR is supratherapeutic at 3.6 Will hold warfarin tonight to allow INR to fall back to therapeutic range Continue to monitor PT/INR and CBC.   Raiford Noble, PharmD Pharmacy Resident  07/13/2020 8:04 AM

## 2020-07-13 NOTE — Consult Note (Addendum)
WOC Nurse Consult Note: WOC consult was previously performed on 8/17.  Bedside nurse requested assistance with dressing changes to bilat legs today.   Pt states he has been followed by the outpatient wound care center prior to admission and was wearing Una boots. Right leg had areas of linear deep tissue pressure injuries from previous compression wraps being too tight:  Recommend pt does not resume Una boots until these areas are resolved. Removed compression wraps to assess bilat legs.  Remain with generalized edema which has decreased, less and weeping and patchy areas of red moist full thickness stasis ulcers, appearance has improved since previous assessment.   Wound type:  2 areas of full thickness stasis ulcers which are yellow and moist with mod amt yellow drainage;4X2X.2cm to lower inner ankle. Left upper leg 4X5X.2cm Right upper leg with previous deep tissue pressure injuries has evolved into full thickness tissue loss; red and moist, mod amt yellow drainage; affected area approx 15X15X.2cm Right lower leg with the same appearance; affected area is approx 4X5X.2cm  Applied compression wraps and dressings to bilat legs; these are NOT UNNA BOOTS  and can be changed by the bedside nurse.  Dressing procedure/placement/frequency: Topical treatment orders provided for bedside nurse to perform as follows to promote drying and healing and provide light compression: Change dressings to bilat legs Q Tues/Thurs as follows:  Apply xeroform gauze over wounds, then cover with Abd pads. Cover with kerlex, beginning just behind toes, to below knees in a spiral fashion, then ace wrap in the same manner. Please re-consult if further assistance is needed.  Thank-you,  Cammie Mcgee MSN, RN, CWOCN, [place], CNS [phone]

## 2020-07-13 NOTE — Plan of Care (Signed)

## 2020-07-13 NOTE — Progress Notes (Signed)
Progress Note  Patient Name: [name] Date of Encounter: 07/13/2020  [facility] Cardiologist: Julien Nordmann, MD   Subjective   Feels well this morning, his legs are still tender to palpation Denies significant shortness of breath, able to ambulate with a walker, sitting up eating breakfast, no distress Still with swelling tracking above his bandages around the knee level into the lower thigh area  Inpatient Medications    Scheduled Meds: . allopurinol  300 mg Oral Daily  . cloNIDine  0.1 mg Oral BID  . doxazosin  8 mg Oral BID  . DULoxetine  30 mg Oral Daily  . finasteride  5 mg Oral Daily  . furosemide  40 mg Intravenous Daily  . isosorbide mononitrate  60 mg Oral BID  . loratadine  10 mg Oral Daily  . metolazone  2.5 mg Oral Once per day on Mon Thu  . metoprolol succinate  12.5 mg Oral Daily  . potassium chloride SA  30 mEq Oral Daily  . rOPINIRole  2 mg Oral QID  . tamsulosin  0.4 mg Oral QHS  . traZODone  50 mg Oral QHS  . Warfarin - Pharmacist Dosing Inpatient   Does not apply q1600   Continuous Infusions: . vancomycin 1,500 mg (07/12/20 1618)   PRN Meds: acetaminophen, traMADol   Vital Signs    Vitals:   07/12/20 0732 07/12/20 2147 07/12/20 2315 07/13/20 0739  BP: 121/61 (!) 150/71 (!) 120/54 (!) 153/70  Pulse: 67 67 (!) 52 66  Resp: 18 16 16 19   Temp: 97.8 F (36.6 C) 98.1 F (36.7 C) 97.9 F (36.6 C) (!) 97.5 F (36.4 C)  TempSrc: Oral Oral Oral Oral  SpO2: 97% 98% 95% 100%  Weight:      Height:        Intake/Output Summary (Last 24 hours) at 07/13/2020 0854 Last data filed at 07/13/2020 0800 Gross per 24 hour  Intake 640 ml  Output 625 ml  Net 15 ml   Last 3 Weights 07/11/2020 07/10/2020 07/03/2020  Weight (lbs) 211 lb 222 lb 10.6 oz 224 lb 2 oz  Weight (kg) 95.709 kg 101 kg 101.662 kg      Telemetry    Atrial fibrillation- Personally Reviewed  ECG     - Personally Reviewed  Physical Exam   GEN: No acute distress.    Neck: No JVD Cardiac:  Irregularly irregular no murmurs, rubs, or gallops.  Respiratory: Clear to auscultation bilaterally. Leg wraps in place to below the knee, trace erythema and trace pitting edema above the knee bilaterally  GI: Soft, nontender, non-distended  MS: No edema; No deformity. Neuro:  Nonfocal  Psych: Normal affect   Labs    High Sensitivity Troponin:  No results for input(s): TROPONINIHS in the last 720 hours.    Chemistry Recent Labs  Lab 07/10/20 1201 07/11/20 0631 07/11/20 1452 07/12/20 0544 07/13/20 0501  NA 133*   < > 132* 133* 135  K 3.5   < > 3.1* 3.0* 4.1  CL 89*   < > 92* 90* 94*  CO2 31   < > 28 30 30   GLUCOSE 105*   < > 125* 97 105*  BUN 44*   < > 47* 44* 42*  CREATININE 1.46*   < > 1.26* 1.40*  1.41* 1.17  CALCIUM 8.9   < > 8.4* 8.8* 8.8*  PROT 7.1  --   --   --   --   ALBUMIN 3.8  --   --   --   --  AST 22  --   --   --   --   ALT 10  --   --   --   --   ALKPHOS 108  --   --   --   --   BILITOT 1.9*  --   --   --   --   GFRNONAA 43*   < > 51* 45*  45* 56*  GFRAA 50*   < > 59* 52*  52* >60  ANIONGAP 13   < > 12 13 11    < > = values in this interval not displayed.     Hematology Recent Labs  Lab 07/10/20 1201 07/11/20 0631 07/12/20 0544  WBC 9.2 8.6 7.4  RBC 3.55* 3.45* 3.26*  HGB 10.4* 10.0* 9.5*  HCT 30.5* 30.6* 29.1*  MCV 85.9 88.7 89.3  MCH 29.3 29.0 29.1  MCHC 34.1 32.7 32.6  RDW 15.8* 15.8* 15.9*  PLT 212 209 193    BNP Recent Labs  Lab 07/11/20 0631  BNP 326.8*     DDimer No results for input(s): DDIMER in the last 168 hours.   Radiology    DG Chest Port 1 View  Result Date: 07/11/2020 CLINICAL DATA:  Dyspnea. History of coronary artery disease and aortic valve replacement. Progressive cellulitis. EXAM: PORTABLE CHEST 1 VIEW COMPARISON:  Two-view chest x-ray 06/26/2020 FINDINGS: Heart is enlarged. Valve replacement noted. CABG noted. Increasing interstitial pattern is present. Bibasilar opacities likely  reflect atelectasis. IMPRESSION: 1. Cardiomegaly with increasing interstitial pattern compatible with congestive heart failure. 2. Bibasilar atelectasis. Electronically Signed   By: 08/26/2020 M.D.   On: 07/11/2020 09:47    Cardiac Studies   Echocardiogram March 2021 1. Left ventricular ejection fraction, by estimation, is 55 to 60%. The  left ventricle has normal function. The left ventricle has no regional  wall motion abnormalities. Left ventricular diastolic function could not  be evaluated.  2. Right ventricular systolic function is normal. The right ventricular  size is mildly enlarged.  3. Left atrial size was severely dilated.  4. Right atrial size was severely dilated.  5. The mitral valve is degenerative. Mild mitral valve regurgitation.  6. Tricuspid valve regurgitation is moderate to severe.  7. The aortic valve has been repaired/replaced. Aortic valve  regurgitation is not visualized. There is a mechanical valve present in  the aortic position.  Patient Profile     86 y.o. male with history of nonobstructive CAD by LHC in 2011, permanent A. fib, aortic stenosis status post SJM mechanical AVR in 2002 on Coumadin, HFpEF, HTN, HLD, GI bleed, chronic venous stasis with venous insufficiency followed by vascular surgery, chronic dyspnea, RLS, OSA on CPAP, anxiety, depression, and GERDwho is being seen today for the evaluation ofacute on chronic HFpEF  Assessment & Plan   Cellulitis/lower extremity edema Followed by the wound clinic, Unna boot placed as outpatient 4 days ago -Would consider outpatient lymphedema compression pumps -Currently on  Antibiotics, leg still tender to palpation -Nurses report scheduled to be seen by wound clinic today, have legs rewrapped Can reevaluate at that time  Pulmonary edema Treated with Lasix IV as inpatient As outpatient treated with torsemide 40 daily up to 40 twice daily for leg swelling with metolazone twice a  week -Typically runs creatinine 1.4, prerenal state for symptom relief -Predominant part of his leg swelling is unrelated to cardiac etiology, has dependent edema/lymphedema best served with lymphedema compression pumps, wraps --For now would continue IV Lasix daily as he  is inpatient When ready for discharge would transition back to torsemide 40 daily with extra 40 for 3 pound weight gain or worsening leg swelling  Permanent atrial fibrillation On metoprolol, rate relatively well controlled, on warfarin Goal INR 2.5 up to 3.5  Recent INR 3.6, he is anemic with hemoglobin 9.5 There is a trend downward that needs to be watched  Acute on chronic renal failure Renal function stable Could continue IV Lasix daily while inpatient while on antibiotics, transition to torsemide as above at discharge  Mechanical AVR Previous evaluated by echo March 2021, On warfarin   Total encounter time more than 25 minutes  Greater than 50% was spent in counseling and coordination of care with the patient   For questions or updates, please contact [facility] Please consult [email or web address] for contact info under        Signed, Julien Nordmann, MD  07/13/2020, 8:54 AM

## 2020-07-13 NOTE — Progress Notes (Signed)
[facility]  PROGRESS NOTE  [name] IFO:[id number] DOB: [birth year] DOA: 07/10/2020 PCP: Gracelyn Nurse, MD   Brief HPI:   86 year old male with past medical history of aortic stenosis s/p mechanical valve replacement on Coumadin, chronic diastolic heart failure, hypertension, CAD, history of CVA presented with complaints of worsening lower extremity edema, redness and cellulitis.  Patient had lower extremity venous ablation performed in May by vascular surgery.  Since then he has been followed by wound care.  He was seen by wound care team and was recommended to go to ED for worsening erythema suspicious for cellulitis.    Subjective   Patient seen and examined, denies any new complaints.  No shortness of breath.   Assessment/Plan:     1. Lower extremity cellulitis-superimposed on chronic venous insufficiency-stasis ulcers-wound care consulted, patient on IV vancomycin.  Continue with compression wraps. 2. Aortic stenosis s/p mechanical TAVR-continue anticoagulation with Coumadin. 3. Acute on chronic diastolic CHF-patient presented with worsening shortness of breath and lower extremity edema.  Cardiology was consulted.  Currently patient on IV Lasix 40 mg daily, metolazone twice a week.  Cardiology has cleared patient for discharge.  We will switch to p.o. Lasix at the time of discharge. 4. Hypokalemia-replete 5. Restless leg syndrome-continue Requip 6. CKD stage IIIb-patient creatinine 1.4.  Continue with IV Lasix. 7. Atrial fibrillation-continue metoprolol, Coumadin 8. Peripheral neuropathy-citalopram switched to duloxetine 30 mg p.o. daily.      COVID-19 Labs  No results for input(s): DDIMER, FERRITIN, LDH, CRP in the last 72 hours.  Lab Results  Component Value Date   SARSCOV2NAA NEGATIVE 07/10/2020   SARSCOV2NAA Detected (A) 08/30/2019     Scheduled medications:   . allopurinol  300 mg Oral Daily  . cloNIDine  0.1 mg Oral BID  . doxazosin  8 mg  Oral BID  . DULoxetine  30 mg Oral Daily  . finasteride  5 mg Oral Daily  . furosemide  40 mg Intravenous Daily  . isosorbide mononitrate  60 mg Oral BID  . loratadine  10 mg Oral Daily  . metolazone  2.5 mg Oral Once per day on Mon Thu  . metoprolol succinate  12.5 mg Oral Daily  . potassium chloride SA  30 mEq Oral Daily  . rOPINIRole  2 mg Oral QID  . tamsulosin  0.4 mg Oral QHS  . traZODone  50 mg Oral QHS  . Warfarin - Pharmacist Dosing Inpatient   Does not apply q1600      CBG: No results for input(s): GLUCAP in the last 168 hours.  SpO2: 99 %    CBC: Recent Labs  Lab 07/10/20 1201 07/11/20 0631 07/12/20 0544  WBC 9.2 8.6 7.4  NEUTROABS 7.4  --   --   HGB 10.4* 10.0* 9.5*  HCT 30.5* 30.6* 29.1*  MCV 85.9 88.7 89.3  PLT 212 209 193    Basic Metabolic Panel: Recent Labs  Lab 07/10/20 1201 07/11/20 0631 07/11/20 1452 07/12/20 0544 07/13/20 0501  NA 133* 135 132* 133* 135  K 3.5 2.8* 3.1* 3.0* 4.1  CL 89* 90* 92* 90* 94*  CO2 31 31 28 30 30   GLUCOSE 105* 113* 125* 97 105*  BUN 44* 43* 47* 44* 42*  CREATININE 1.46* 1.21 1.26* 1.40*  1.41* 1.17  CALCIUM 8.9 8.6* 8.4* 8.8* 8.8*  MG  --  2.3  --   --   --      Liver Function Tests: Recent Labs  Lab 07/10/20 1201  AST 22  ALT 10  ALKPHOS 108  BILITOT 1.9*  PROT 7.1  ALBUMIN 3.8     Antibiotics: Anti-infectives (From admission, onward)   Start     Dose/Rate Route Frequency Ordered Stop   07/11/20 1300  vancomycin (VANCOREADY) IVPB 1250 mg/250 mL  Status:  Discontinued        1,250 mg 166.7 mL/hr over 90 Minutes Intravenous Every 24 hours 07/11/20 0216 07/11/20 0836   07/11/20 1000  vancomycin (VANCOREADY) IVPB 1500 mg/300 mL        1,500 mg 150 mL/hr over 120 Minutes Intravenous Every 24 hours 07/11/20 0836     07/10/20 2215  vancomycin (VANCOCIN) IVPB 1000 mg/200 mL premix        1,000 mg 200 mL/hr over 60 Minutes Intravenous  Once 07/10/20 2207 07/11/20 0133       DVT prophylaxis:  Coumadin  Code Status: Full code  Family Communication: Discussed with patient's daughter at bedside    Status is: Inpatient  Dispo: The patient is from: Home              Anticipated d/c is to: Home              Anticipated d/c date is: 07/14/2020              Patient currently not medically stable for discharge  Barrier to discharge-ongoing treatment for right lower extremity cellulitis.      Consultants:    Procedures:     Objective   Vitals:   07/12/20 2147 07/12/20 2315 07/13/20 0739 07/13/20 1459  BP: (!) 150/71 (!) 120/54 (!) 153/70 (!) 116/55  Pulse: 67 (!) 52 66 61  Resp: 16 16 19 16   Temp: 98.1 F (36.7 C) 97.9 F (36.6 C) (!) 97.5 F (36.4 C) 97.8 F (36.6 C)  TempSrc: Oral Oral Oral   SpO2: 98% 95% 100% 99%  Weight:      Height:        Intake/Output Summary (Last 24 hours) at 07/13/2020 1724 Last data filed at 07/13/2020 1625 Gross per 24 hour  Intake 1480 ml  Output 1425 ml  Net 55 ml    08/17 1901 - 08/19 0700 In: 240 [P.O.:240] Out: 975 [Urine:975]  Filed Weights   07/10/20 1200 07/11/20 0129  Weight: 101 kg 95.7 kg    Physical Examination:    General-appears in no acute distress  Heart-S1-S2, regular, no murmur auscultated  Lungs-clear to auscultation bilaterally, no wheezing or crackles auscultated  Abdomen-soft, nontender, no organomegaly  Extremities-compression wraps in place  Neuro-alert, oriented x3, no focal deficit noted    Data Reviewed:   Recent Results (from the past 240 hour(s))  Blood culture (routine x 2)     Status: None (Preliminary result)   Collection Time: 07/10/20 11:21 PM   Specimen: BLOOD  Result Value Ref Range Status   Specimen Description BLOOD LEFT ANTECUBITAL  Final   Special Requests   Final    BOTTLES DRAWN AEROBIC AND ANAEROBIC Blood Culture results may not be optimal due to an excessive volume of blood received in culture bottles   Culture   Final    NO GROWTH 3 DAYS Performed at  [facility], [address] [place], [place]    Report Status PENDING  Incomplete  Blood culture (routine x 2)     Status: None (Preliminary result)   Collection Time: 07/10/20 11:21 PM   Specimen: BLOOD  Result Value Ref Range Status  Specimen Description BLOOD RIGHT ANTECUBITAL  Final   Special Requests   Final    BOTTLES DRAWN AEROBIC AND ANAEROBIC Blood Culture adequate volume   Culture   Final    NO GROWTH 3 DAYS Performed at [facility], [address] [place], [place] [address]    Report Status PENDING  Incomplete  SARS Coronavirus 2 by RT PCR (hospital order, performed in [facility] hospital lab) Nasopharyngeal Nasopharyngeal Swab     Status: None   Collection Time: 07/10/20 11:21 PM   Specimen: Nasopharyngeal Swab  Result Value Ref Range Status   SARS Coronavirus 2 NEGATIVE NEGATIVE Final    Comment: (NOTE) SARS-CoV-2 target nucleic acids are NOT DETECTED.  The SARS-CoV-2 RNA is generally detectable in upper and lower respiratory specimens during the acute phase of infection. The lowest concentration of SARS-CoV-2 viral copies this assay can detect is 250 copies / mL. A negative result does not preclude SARS-CoV-2 infection and should not be used as the sole basis for treatment or other patient management decisions.  A negative result may occur with improper specimen collection / handling, submission of specimen other than nasopharyngeal swab, presence of viral mutation(s) within the areas targeted by this assay, and inadequate number of viral copies (<250 copies / mL). A negative result must be combined with clinical observations, patient history, and epidemiological information.  Fact Sheet for Patients:   BoilerBrush.com.cy  Fact Sheet for Healthcare Providers: https://pope.com/  This test is not yet approved or  cleared by the [place] FDA and has been authorized for  detection and/or diagnosis of SARS-CoV-2 by FDA under an Emergency Use Authorization (EUA).  This EUA will remain in effect (meaning this test can be used) for the duration of the COVID-19 declaration under Section 564(b)(1) of the Act, 21 U.S.C. section 360bbb-3(b)(1), unless the authorization is terminated or revoked sooner.  Performed at [facility], [address] [place], [place] [address]     BNP (last 3 results) Recent Labs    07/11/20 0631  BNP 326.8*     Studies:  No results found.     Meredeth Ide   [facility] If 7PM-7AM, please contact night-coverage at [email or web address], Office  [phone]   07/13/2020, 5:24 PM  LOS: 2 days

## 2020-07-13 NOTE — Progress Notes (Signed)
C/o left hip pain intermittent. Sharpe at times. He said he has had this pain before but has not told his PCP about it.

## 2020-07-13 NOTE — Telephone Encounter (Signed)
Spoke with patients daughter per release form and she stated that yesterday she saw Dr. Mariah Milling when going to see her father in the hospital. He recommended that she call me for update on his care. Reviewed his visit note with her and discussed his findings as well. Encouraged her to ask for social work when visiting with her dad to discuss plan as well. She was appreciative for the review and had no further questions at this time.

## 2020-07-14 LAB — PROTIME-INR
INR: 3.4 — ABNORMAL HIGH (ref 0.8–1.2)
Prothrombin Time: 33 seconds — ABNORMAL HIGH (ref 11.4–15.2)

## 2020-07-14 LAB — CREATININE, SERUM
Creatinine, Ser: 1.16 mg/dL (ref 0.61–1.24)
GFR calc Af Amer: >60 mL/min (ref >60)
GFR calc non Af Amer: 57 mL/min — ABNORMAL LOW (ref >60)

## 2020-07-14 MED ORDER — DULOXETINE HCL 30 MG PO CPEP: 30.0000 mg | ORAL_CAPSULE | Freq: Every day | ORAL | 3 refills | Status: AC

## 2020-07-14 MED ORDER — TORSEMIDE 20 MG PO TABS: 40.0000 mg | ORAL_TABLET | Freq: Every day | ORAL | 2 refills | Status: DC

## 2020-07-14 MED ORDER — WARFARIN SODIUM 2 MG PO TABS
2.0000 mg | ORAL_TABLET | Freq: Once | ORAL | Status: DC
  Filled 2020-07-14: qty 1

## 2020-07-14 NOTE — Progress Notes (Signed)
Patient bed changed out and new linen change due to bed alarm malfunction. Patient up in chair alarm.

## 2020-07-14 NOTE — Care Management Important Message (Signed)
Important Message  Patient Details  Name: [name] MRN: [id number] Date of Birth: [birth year]   Medicare Important Message Given:  Yes     [name] 07/14/2020, 11:26 AM

## 2020-07-14 NOTE — Plan of Care (Signed)
  Problem: Education: Goal: Knowledge of General Education information will improve Description: Including pain rating scale, medication(s)/side effects and non-pharmacologic comfort measures Outcome: Adequate for Discharge   Problem: Health Behavior/Discharge Planning: Goal: Ability to manage health-related needs will improve Outcome: Adequate for Discharge   Problem: Clinical Measurements: Goal: Ability to maintain clinical measurements within normal limits will improve Outcome: Adequate for Discharge Goal: Will remain free from infection Outcome: Adequate for Discharge Goal: Diagnostic test results will improve Outcome: Adequate for Discharge Goal: Respiratory complications will improve Outcome: Adequate for Discharge Goal: Cardiovascular complication will be avoided Outcome: Adequate for Discharge   Problem: Activity: Goal: Risk for activity intolerance will decrease Outcome: Adequate for Discharge   Problem: Nutrition: Goal: Adequate nutrition will be maintained Outcome: Adequate for Discharge   Problem: Coping: Goal: Level of anxiety will decrease Outcome: Adequate for Discharge   Problem: Elimination: Goal: Will not experience complications related to bowel motility Outcome: Adequate for Discharge Goal: Will not experience complications related to urinary retention Outcome: Adequate for Discharge   Problem: Pain Managment: Goal: General experience of comfort will improve Outcome: Adequate for Discharge   Problem: Safety: Goal: Ability to remain free from injury will improve Outcome: Adequate for Discharge   Problem: Health Behavior/Discharge Planning: Goal: Ability to manage health-related needs will improve Outcome: Adequate for Discharge   Problem: Clinical Measurements: Goal: Ability to maintain clinical measurements within normal limits will improve Outcome: Adequate for Discharge Goal: Will remain free from infection Outcome: Adequate for  Discharge Goal: Diagnostic test results will improve Outcome: Adequate for Discharge Goal: Respiratory complications will improve Outcome: Adequate for Discharge Goal: Cardiovascular complication will be avoided Outcome: Adequate for Discharge   Problem: Elimination: Goal: Will not experience complications related to bowel motility Outcome: Adequate for Discharge Goal: Will not experience complications related to urinary retention Outcome: Adequate for Discharge

## 2020-07-14 NOTE — Progress Notes (Signed)
Report called to [facility] at [facility] , EMS called to transport patient  .

## 2020-07-14 NOTE — Progress Notes (Signed)
Pharmacy Antibiotic Note  [name] is a 86 y.o. male admitted on 07/10/2020 with cellulitis.  Pharmacy has been consulted for Vancomycin dosing. Patient did not receive a full loading dose so maintenance dose was started early.   Day 5 IV abx. Renal function remains stable. Current dose is still appropriate based on weight and renal function.  Plan: Continue Vancomycin 1500mg  IV Q24 hrs per dosing nomogram --Continue to monitor renal function --If patient stays, plan to order vanc level 0930 08/21   Height: 5\' 11"  (180.3 cm) Weight: 95.7 kg (211 lb) IBW/kg (Calculated) : 75.3  Temp (24hrs), Avg:97.6 F (36.4 C), Min:97.4 F (36.3 C), Max:97.8 F (36.6 C)  Recent Labs  Lab 07/10/20 1201 07/10/20 1201 07/11/20 0631 07/11/20 1452 07/12/20 0544 07/13/20 0501 07/14/20 0651  WBC 9.2  --  8.6  --  7.4  --   --   CREATININE 1.46*   < > 1.21 1.26* 1.40*  1.41* 1.17 1.16  LATICACIDVEN 1.0  --   --   --   --   --   --    < > = values in this interval not displayed.    Estimated Creatinine Clearance: 54 mL/min (by C-G formula based on SCr of 1.16 mg/dL).    Allergies  Allergen Reactions  . Sulfa Antibiotics Other (See Comments)    Reaction:  Unknown   . Levofloxacin Nausea Only    Antimicrobials this admission: 8/17 Vancomycin >>   Dose adjustments this admission: 8/17 Vancomycin increased from 1250mg  q24h to 1500mg  q24h based on improved renal function  Microbiology results: 8/16 BCx: NGTD  Thank you for allowing pharmacy to be a part of this patient's care.  9/17, PharmD Pharmacy Resident  07/14/2020 9:55 AM

## 2020-07-14 NOTE — Progress Notes (Addendum)
ANTICOAGULATION CONSULT NOTE  Pharmacy Consult for Warfarin Indication: aortic stenosis s/p mechanical TAVR  Allergies  Allergen Reactions  . Sulfa Antibiotics Other (See Comments)    Reaction:  Unknown   . Levofloxacin Nausea Only    Patient Measurements: Height: 5\' 11"  (180.3 cm) Weight: 95.7 kg (211 lb) IBW/kg (Calculated) : 75.3  Vital Signs: Temp: 97.5 F (36.4 C) (08/20 0722) Temp Source: Oral (08/20 0722) BP: 141/65 (08/20 0722) Pulse Rate: 74 (08/20 0722)  Labs: Recent Labs    07/12/20 0544 07/13/20 0501 07/14/20 0651  HGB 9.5*  --   --   HCT 29.1*  --   --   PLT 193  --   --   LABPROT 33.5* 35.1* 33.0*  INR 3.4* 3.6* 3.4*  CREATININE 1.40*  1.41* 1.17 1.16    Estimated Creatinine Clearance: 54 mL/min (by C-G formula based on SCr of 1.16 mg/dL).   Medical History: Past Medical History:  Diagnosis Date  . Anxiety 10/11  . Aortic stenosis    a. s/p mechcanical AVR, 2002; b. 10/2018 Echo: Triv AI, mean grad 11/2018.  . Bradycardia    chronic, no symptoms 07/2010  . C. difficile colitis   . Carotid bruit    dopplers in past, no abnormalities  . Chronic diastolic CHF (congestive heart failure) (HCC)    a. Echo 03/2015: EF 60-65%, no RWMA, GR1DD, mild BAE, mild to mod MR, mod TR, PASP 65 mmHg; b. 01/2017 Echo: EF 55-60%, NRWMA, grade 1 diastolic dysfunction.  Normal functioning prosthetic aortic valve.  Mean gradient 50 mmHg.  Sev TR. PASP 02/2017; c. 10/2018 Echo: EF 55-60%, Triv AI, mod dil LA, mod-sev TR, PASP 35-40, mild to mod red RV fxn.  . Coronary artery disease    a. mild, cath, 08/2010; b. medically managed  . Decreased hearing    Right ear  . Depression   . Gastric ulcer   . GERD (gastroesophageal reflux disease)   . Hypertension    BP higher than usual 04/19/10; amlodipine increased by telephone  . Mod-Sev Tricuspid regurgitation    a. 10/2018 Echo: Mod-Sev TR, PASP 35-14mmHg.  43m RLS (restless legs syndrome) 08/23/2015  . S/P AVR    a. [place] mechanical 2002; b. echo 08/2010 EF 60%, trival AI, mild MR, AVR working well; c. on longterm warfarin tx  . SOB (shortness of breath) 10/11   08/2010,Episodes at 5 AM, eventually felt to be anxiety, after complete workup including catheterization, pt greatly improved with anxiety meds 11/11    Medications:  8/18 Spoke with Coumadin clinic to verify home regimen Warfarin 5mg  on Monday, Wednesday and Friday Warfarin 2.5mg  on all other days  Assessment: Pharmacy has been consulted for warfarin dosing and monitoring in an 86yo male admitted for suspected worsening cellulitis. PMH significant for aortic stenosis s/p mechanical TAVR (on Coumadin), chronic diastolic HF, HTN, and CAD.   Hgb is stable and platelets are WNL.   Per med rec, patients last dose of warfarin was 8/16 which would have been 5mg .   Patient has had poor PO intake - 8/19 appetite improving  Date INR Dose 8/17 2.8 2.5 mg 8/18 3.4 1 mg 8/19 3.6 Held 8/20 3.4   Goal of Therapy:  INR 2.5-3.5 (per Coumadin clinic) Monitor platelets by anticoagulation protocol: Yes   Plan:  INR is therapeutic at 3.4 Will give warfarin 2mg  tonight in anticipation for INR to decrease since patient's PO intake increased and dose held yesterday Continue to monitor PT/INR and  CBC.   Raiford Noble, PharmD Pharmacy Resident  07/14/2020 9:48 AM

## 2020-07-14 NOTE — Progress Notes (Signed)
Patient picked up by EMS and transported via stretcher and is en route to [facility]

## 2020-07-14 NOTE — Discharge Summary (Addendum)
Physician Discharge Summary  Cornell Bourbon DJT:[id number] DOB: 1931/09/10 DOA: 07/10/2020  PCP: Gracelyn Nurse, MD  Admit date: 07/10/2020 Discharge date: 07/14/2020  Time spent: 50 minutes  Recommendations for Outpatient Follow-up:  1. Follow-up cardiology in 2 weeks  Wound care recommendations. Dressing procedure/placement/frequency:Topical treatment orders provided for bedside nurse to perform as follows to promote drying and healing and provide light compression:Change dressings to bilat legs Q Tues/Thurs as follows: Apply xeroform gauze over wounds, then cover with Abd pads. Cover with kerlex, beginning just behind toes, to below knees in a spiral fashion, then ace wrap in the same manner.   Discharge Diagnoses:  Principal Problem:   Cellulitis Active Problems:   Aortic stenosis   Warfarin anticoagulation   History of mechanical aortic valve replacement   RLS (restless legs syndrome)   Chronic diastolic CHF (congestive heart failure) (HCC)   AKI (acute kidney injury) (HCC)   Acute on chronic heart failure with preserved ejection fraction (HFpEF) (HCC)   Permanent atrial fibrillation (HCC) Sepsis ruled out  Discharge Condition: Stable  Diet recommendation: Heart healthy diet  Filed Weights   07/10/20 1200 07/11/20 0129  Weight: 101 kg 95.7 kg    History of present illness:  86 year old male with past medical history of aortic stenosis s/p mechanical valve replacement on Coumadin, chronic diastolic heart failure, hypertension, CAD, history of CVA presented with complaints of worsening lower extremity edema, redness and cellulitis.  Patient had lower extremity venous ablation performed in May by vascular surgery.  Since then he has been followed by wound care.  He was seen by wound care team and was recommended to go to ED for worsening erythema suspicious for cellulitis.  Hospital Course:   1. Lower extremity cellulitis-superimposed on chronic venous  insufficiency-stasis ulcers-wound care consulted, patient was started on IV vancomycin.  Has completed 5 days of treatment.  Will discontinue antibiotics at this time.  Cellulitis has resolved.  Wound care recommendations as above.   Continue with compression wraps. 2. Aortic stenosis s/p mechanical TAVR-continue anticoagulation with Coumadin. 3. Acute on chronic diastolic CHF-patient presented with worsening shortness of breath and lower extremity edema.  Cardiology was consulted.    Patient was started on IV Lasix 40 mg daily.  Also continue metolazone twice a week.  Cardiology has cleared patient for discharge.  We will switch back to torsemide 40 mg daily, to take additional dose of 40 mg for 3 pound weight gain or leg swelling.  Patient to follow-up cardiology in 2 weeks.   4. Hypokalemia-replete 5. Restless leg syndrome-continue Requip 6. CKD stage IIIb-patient creatinine 1.16.  Continue torsemide, metolazone. 7. Atrial fibrillation-continue metoprolol, Coumadin 8. Peripheral neuropathy-citalopram switched to duloxetine 30 mg p.o. daily.      Procedures:    Consultations:  Cardiology  Discharge Exam: Vitals:   07/13/20 2324 07/14/20 0722  BP: 130/67 (!) 141/65  Pulse: 63 74  Resp: 19 16  Temp: (!) 97.4 F (36.3 C) (!) 97.5 F (36.4 C)  SpO2: 98% 98%    General: Appears in no acute distress Cardiovascular: S1-S2, regular, no murmur auscultated Respiratory: Clear to auscultation bilaterally  Discharge Instructions   Discharge Instructions    Change dressing (specify)   Complete by: As directed    Dressing procedure/placement/frequency: Topical treatment orders provided for bedside nurse to perform as follows to promote drying and healing and provide light compression: Change dressings to bilat legs Q Tues/Thurs as follows:  Apply xeroform gauze over wounds, then cover with Abd pads.  Cover with kerlex, beginning just behind toes, to below knees in a spiral fashion, then  ace wrap in the same manner   Diet - low sodium heart healthy   Complete by: As directed    Discharge instructions   Complete by: As directed    Dressing procedure/placement/frequency: Topical treatment orders provided for bedside nurse to perform as follows to promote drying and healing and provide light compression: Change dressings to bilat legs Q Tues/Thurs as follows:  Apply xeroform gauze over wounds, then cover with Abd pads. Cover with kerlex, beginning just behind toes, to below knees in a spiral fashion, then ace wrap in the same manner   Increase activity slowly   Complete by: As directed      Allergies as of 07/14/2020      Reactions   Sulfa Antibiotics Other (See Comments)   Reaction:  Unknown    Levofloxacin Nausea Only      Medication List    STOP taking these medications   cephALEXin 500 MG capsule Commonly known as: KEFLEX   citalopram 20 MG tablet Commonly known as: CELEXA   Flomax 0.4 MG Caps capsule Generic drug: tamsulosin     TAKE these medications   allopurinol 300 MG tablet Commonly known as: ZYLOPRIM Take 300 mg by mouth daily.   amoxicillin 500 MG capsule Commonly known as: AMOXIL 4 CAPSULES PRIOR TO DENTAL PROCEDURES AS DIRECTED   Cetirizine HCl 10 MG Caps Take 10 mg by mouth daily.   cloNIDine 0.1 MG tablet Commonly known as: CATAPRES TAKE 1 TABLET BY MOUTH  TWICE DAILY   doxazosin 8 MG tablet Commonly known as: CARDURA Take 8 mg by mouth 2 (two) times daily.   DULoxetine 30 MG capsule Commonly known as: CYMBALTA Take 1 capsule (30 mg total) by mouth daily. Start taking on: July 15, 2020   finasteride 5 MG tablet Commonly known as: PROSCAR Take 5 mg by mouth daily.   isosorbide mononitrate 60 MG 24 hr tablet Commonly known as: IMDUR Take 1 tablet (60 mg total) by mouth 2 (two) times daily.   metolazone 2.5 MG tablet Commonly known as: ZAROXOLYN Take 1 tablet (2.5 mg total) by mouth 2 (two) times a week.   metoprolol  succinate 25 MG 24 hr tablet Commonly known as: TOPROL-XL Take 0.5 tablets (12.5 mg total) by mouth daily.   neomycin-polymyxin-hydrocortisone 3.5-10000-1 OTIC suspension Commonly known as: CORTISPORIN Place 3 drops into the right ear 4 (four) times daily.   potassium chloride 10 MEQ tablet Commonly known as: KLOR-CON TAKE 3 TABLETS (30 MEQ) BY MOUTH DAILY. TAKE AN ADDITIONAL 2TABS ON THE DAYS YOU TAKE METOLAZONE   rOPINIRole 2 MG tablet Commonly known as: REQUIP TAKE 1 TABLET BY MOUTH 4  TIMES DAILY What changed: See the new instructions.   torsemide 20 MG tablet Commonly known as: Demadex Take 2 tablets (40 mg total) by mouth daily. Take additional 40 mg for 3 pounds weight gain . What changed:   how much to take  how to take this  when to take this  additional instructions   traMADol 50 MG tablet Commonly known as: ULTRAM Take 50 mg by mouth 2 (two) times daily as needed.   traZODone 50 MG tablet Commonly known as: DESYREL Take 50 mg by mouth at bedtime.   warfarin 5 MG tablet Commonly known as: COUMADIN Take as directed. If you are unsure how to take this medication, talk to your nurse or doctor. Original instructions: TAKE 1 TABLET  BY MOUTH  DAILY OR AS DIRECTED BY THE ANTICOAGULATION CLINIC What changed: See the new instructions.            Discharge Care Instructions  (From admission, onward)         Start     Ordered   07/14/20 0000  Change dressing (specify)       Comments: Dressing procedure/placement/frequency: Topical treatment orders provided for bedside nurse to perform as follows to promote drying and healing and provide light compression: Change dressings to bilat legs Q Tues/Thurs as follows:  Apply xeroform gauze over wounds, then cover with Abd pads. Cover with kerlex, beginning just behind toes, to below knees in a spiral fashion, then ace wrap in the same manner   07/14/20 1203         Allergies  Allergen Reactions  . Sulfa  Antibiotics Other (See Comments)    Reaction:  Unknown   . Levofloxacin Nausea Only    Follow-up Information    [facility] Follow up on 07/25/2020.   Specialty: Cardiology Why: at 3:30pm. Enter through the Medical Mall entrance Contact information: [address] [place] [address] [phone]               The results of significant diagnostics from this hospitalization (including imaging, microbiology, ancillary and laboratory) are listed below for reference.    Significant Diagnostic Studies: DG Chest 2 View  Result Date: 06/26/2020 CLINICAL DATA:  Fluid overload. EXAM: CHEST - 2 VIEW COMPARISON:  November 24, 2018. FINDINGS: Stable cardiomediastinal silhouette. No pneumothorax or pleural effusion is noted. Mild central pulmonary vascular congestion is noted with bibasilar opacities most consistent with pulmonary edema. Status post cardiac valve repair. Minimal pleural effusions may be present. Bony thorax is unremarkable. IMPRESSION: Mild central pulmonary vascular congestion is noted with bibasilar pulmonary edema. Electronically Signed   By: Lupita Raider M.D.   On: 06/26/2020 15:33   DG Chest Port 1 View  Result Date: 07/11/2020 CLINICAL DATA:  Dyspnea. History of coronary artery disease and aortic valve replacement. Progressive cellulitis. EXAM: PORTABLE CHEST 1 VIEW COMPARISON:  Two-view chest x-ray 06/26/2020 FINDINGS: Heart is enlarged. Valve replacement noted. CABG noted. Increasing interstitial pattern is present. Bibasilar opacities likely reflect atelectasis. IMPRESSION: 1. Cardiomegaly with increasing interstitial pattern compatible with congestive heart failure. 2. Bibasilar atelectasis. Electronically Signed   By: Marin Roberts M.D.   On: 07/11/2020 09:47    Microbiology: Recent Results (from the past 240 hour(s))  Blood culture (routine x 2)     Status: None (Preliminary result)    Collection Time: 07/10/20 11:21 PM   Specimen: BLOOD  Result Value Ref Range Status   Specimen Description BLOOD LEFT ANTECUBITAL  Final   Special Requests   Final    BOTTLES DRAWN AEROBIC AND ANAEROBIC Blood Culture results may not be optimal due to an excessive volume of blood received in culture bottles   Culture   Final    NO GROWTH 4 DAYS Performed at [facility], [address] [place], [place] [address]    Report Status PENDING  Incomplete  Blood culture (routine x 2)     Status: None (Preliminary result)   Collection Time: 07/10/20 11:21 PM   Specimen: BLOOD  Result Value Ref Range Status   Specimen Description BLOOD RIGHT ANTECUBITAL  Final   Special Requests   Final    BOTTLES DRAWN AEROBIC AND ANAEROBIC Blood Culture adequate volume  Culture   Final    NO GROWTH 4 DAYS Performed at [facility], [address] [place], [place] [address]    Report Status PENDING  Incomplete  SARS Coronavirus 2 by RT PCR (hospital order, performed in [facility] hospital lab) Nasopharyngeal Nasopharyngeal Swab     Status: None   Collection Time: 07/10/20 11:21 PM   Specimen: Nasopharyngeal Swab  Result Value Ref Range Status   SARS Coronavirus 2 NEGATIVE NEGATIVE Final    Comment: (NOTE) SARS-CoV-2 target nucleic acids are NOT DETECTED.  The SARS-CoV-2 RNA is generally detectable in upper and lower respiratory specimens during the acute phase of infection. The lowest concentration of SARS-CoV-2 viral copies this assay can detect is 250 copies / mL. A negative result does not preclude SARS-CoV-2 infection and should not be used as the sole basis for treatment or other patient management decisions.  A negative result may occur with improper specimen collection / handling, submission of specimen other than nasopharyngeal swab, presence of viral mutation(s) within the areas targeted by this assay, and inadequate number of viral copies (<250 copies / mL). A negative  result must be combined with clinical observations, patient history, and epidemiological information.  Fact Sheet for Patients:   BoilerBrush.com.cy  Fact Sheet for Healthcare Providers: https://pope.com/  This test is not yet approved or  cleared by the [place] FDA and has been authorized for detection and/or diagnosis of SARS-CoV-2 by FDA under an Emergency Use Authorization (EUA).  This EUA will remain in effect (meaning this test can be used) for the duration of the COVID-19 declaration under Section 564(b)(1) of the Act, 21 U.S.C. section 360bbb-3(b)(1), unless the authorization is terminated or revoked sooner.  Performed at [facility], [address] [place], [place] [address]      Labs: Basic Metabolic Panel: Recent Labs  Lab 07/10/20 1201 07/10/20 1201 07/11/20 0631 07/11/20 1452 07/12/20 0544 07/13/20 0501 07/14/20 0651  NA 133*  --  135 132* 133* 135  --   K 3.5  --  2.8* 3.1* 3.0* 4.1  --   CL 89*  --  90* 92* 90* 94*  --   CO2 31  --  31 28 30 30   --   GLUCOSE 105*  --  113* 125* 97 105*  --   BUN 44*  --  43* 47* 44* 42*  --   CREATININE 1.46*   < > 1.21 1.26* 1.40*  1.41* 1.17 1.16  CALCIUM 8.9  --  8.6* 8.4* 8.8* 8.8*  --   MG  --   --  2.3  --   --   --   --    < > = values in this interval not displayed.   Liver Function Tests: Recent Labs  Lab 07/10/20 1201  AST 22  ALT 10  ALKPHOS 108  BILITOT 1.9*  PROT 7.1  ALBUMIN 3.8   CBC: Recent Labs  Lab 07/10/20 1201 07/11/20 0631 07/12/20 0544  WBC 9.2 8.6 7.4  NEUTROABS 7.4  --   --   HGB 10.4* 10.0* 9.5*  HCT 30.5* 30.6* 29.1*  MCV 85.9 88.7 89.3  PLT 212 209 193   Cardiac Enzymes: No results for input(s): CKTOTAL, CKMB, CKMBINDEX, TROPONINI in the last 168 hours. BNP: BNP (last 3 results) Recent Labs    07/11/20 0631  BNP 326.8*    ProBNP (last 3 results) No results for input(s): PROBNP in the last 8760  hours.  CBG: No results  for input(s): GLUCAP in the last 168 hours.     Signed:  Meredeth Ide MD.  [facility] 07/14/2020, 12:05 PM

## 2020-07-15 LAB — CULTURE, BLOOD (ROUTINE X 2)
Culture: NO GROWTH
Culture: NO GROWTH
Special Requests: ADEQUATE

## 2020-07-17 ENCOUNTER — Other Ambulatory Visit
Admission: RE | Admit: 2020-07-17 | Discharge: 2020-07-17 | Disposition: A | Discharge status: Home / Self Care | Payer: Medicare Other | Source: Ambulatory Visit | Attending: Internal Medicine | Admitting: Internal Medicine

## 2020-07-17 ENCOUNTER — Telehealth: Payer: Self-pay | Admitting: Family

## 2020-07-17 ENCOUNTER — Telehealth: Payer: Self-pay | Admitting: Cardiovascular Disease

## 2020-07-17 DIAGNOSIS — M1A9XX1 Chronic gout, unspecified, with tophus (tophi): Secondary | ICD-10-CM | POA: Insufficient documentation

## 2020-07-17 DIAGNOSIS — I4892 Unspecified atrial flutter: Secondary | ICD-10-CM | POA: Insufficient documentation

## 2020-07-17 DIAGNOSIS — Z79899 Other long term (current) drug therapy: Secondary | ICD-10-CM | POA: Insufficient documentation

## 2020-07-17 DIAGNOSIS — I5032 Chronic diastolic (congestive) heart failure: Secondary | ICD-10-CM | POA: Insufficient documentation

## 2020-07-17 DIAGNOSIS — Z96649 Presence of unspecified artificial hip joint: Secondary | ICD-10-CM | POA: Insufficient documentation

## 2020-07-17 DIAGNOSIS — M25552 Pain in left hip: Secondary | ICD-10-CM | POA: Insufficient documentation

## 2020-07-17 DIAGNOSIS — I110 Hypertensive heart disease with heart failure: Secondary | ICD-10-CM | POA: Insufficient documentation

## 2020-07-17 LAB — CBC WITH DIFFERENTIAL/PLATELET
Abs Immature Granulocytes: 0.03 10*3/uL (ref 0.00–0.07)
Basophils Absolute: 0 10*3/uL (ref 0.0–0.1)
Basophils Relative: 1 %
Eosinophils Absolute: 0.1 10*3/uL (ref 0.0–0.5)
Eosinophils Relative: 2 %
HCT: 28.8 % — ABNORMAL LOW (ref 39.0–52.0)
Hemoglobin: 9.5 g/dL — ABNORMAL LOW (ref 13.0–17.0)
Immature Granulocytes: 1 %
Lymphocytes Relative: 14 %
Lymphs Abs: 0.7 10*3/uL (ref 0.7–4.0)
MCH: 29.2 pg (ref 26.0–34.0)
MCHC: 33 g/dL (ref 30.0–36.0)
MCV: 88.6 fL (ref 80.0–100.0)
Monocytes Absolute: 0.5 10*3/uL (ref 0.1–1.0)
Monocytes Relative: 9 %
Neutro Abs: 3.7 10*3/uL (ref 1.7–7.7)
Neutrophils Relative %: 73 %
Platelets: 196 10*3/uL (ref 150–400)
RBC: 3.25 MIL/uL — ABNORMAL LOW (ref 4.22–5.81)
RDW: 15.9 % — ABNORMAL HIGH (ref 11.5–15.5)
WBC: 5 10*3/uL (ref 4.0–10.5)
nRBC: 0 % (ref 0.0–0.2)

## 2020-07-17 LAB — COMPREHENSIVE METABOLIC PANEL
ALT: 9 U/L (ref 0–44)
AST: 20 U/L (ref 15–41)
Albumin: 3.2 g/dL — ABNORMAL LOW (ref 3.5–5.0)
Alkaline Phosphatase: 101 U/L (ref 38–126)
Anion gap: 12 (ref 5–15)
BUN: 44 mg/dL — ABNORMAL HIGH (ref 8–23)
CO2: 29 mmol/L (ref 22–32)
Calcium: 8.3 mg/dL — ABNORMAL LOW (ref 8.9–10.3)
Chloride: 94 mmol/L — ABNORMAL LOW (ref 98–111)
Creatinine, Ser: 1.15 mg/dL (ref 0.61–1.24)
GFR calc Af Amer: >60 mL/min (ref >60)
GFR calc non Af Amer: 57 mL/min — ABNORMAL LOW (ref >60)
Glucose, Bld: 122 mg/dL — ABNORMAL HIGH (ref 70–99)
Potassium: 3.5 mmol/L (ref 3.5–5.1)
Sodium: 135 mmol/L (ref 135–145)
Total Bilirubin: 1 mg/dL (ref 0.3–1.2)
Total Protein: 6.1 g/dL — ABNORMAL LOW (ref 6.5–8.1)

## 2020-07-17 LAB — C-REACTIVE PROTEIN: CRP: 4.1 mg/dL — ABNORMAL HIGH (ref <1.0)

## 2020-07-17 LAB — PROTIME-INR
INR: 3.5 — ABNORMAL HIGH (ref 0.8–1.2)
Prothrombin Time: 34 seconds — ABNORMAL HIGH (ref 11.4–15.2)

## 2020-07-17 LAB — SEDIMENTATION RATE: Sed Rate: 68 mm/hr — ABNORMAL HIGH (ref 0–20)

## 2020-07-17 NOTE — Telephone Encounter (Signed)
Spoke to staff at [facility] and confirmed patients Follow up appointment with [place] for 8/31 330pm that was made while patient was in hospital. They said he is doing well and trying to stay active. They have him on a heart healthy diet, no problems with his medications, and no symptoms at this time.   [name], NT

## 2020-07-17 NOTE — Telephone Encounter (Signed)
Patient daughter wants to know timeframe for hospital fu

## 2020-07-18 NOTE — Telephone Encounter (Signed)
Spoke with patient and she wanted to inquire about patients follow up from hospital stay. Reviewed that we typically schedule it for 2 weeks after hospital stay. She wanted to make sure he saw someone. Scheduled him to come see our APP in office and she will accompany him to appointment. She verbalized understanding of our conversation, confirmed appointment, and had no further questions at this time.

## 2020-07-25 ENCOUNTER — Other Ambulatory Visit: Payer: Self-pay

## 2020-07-25 ENCOUNTER — Encounter: Payer: Self-pay | Admitting: Family

## 2020-07-25 ENCOUNTER — Telehealth: Payer: Self-pay

## 2020-07-25 ENCOUNTER — Ambulatory Visit: Payer: Medicare Other | Attending: Family | Admitting: Family

## 2020-07-25 ENCOUNTER — Encounter: Payer: Self-pay | Admitting: Cardiovascular Disease

## 2020-07-25 VITALS — BP 112/44 | HR 62 | Resp 18 | Ht 71.0 in | Wt 213.0 lb

## 2020-07-25 DIAGNOSIS — G4733 Obstructive sleep apnea (adult) (pediatric): Secondary | ICD-10-CM | POA: Insufficient documentation

## 2020-07-25 DIAGNOSIS — I4821 Permanent atrial fibrillation: Secondary | ICD-10-CM | POA: Diagnosis not present

## 2020-07-25 DIAGNOSIS — Z9989 Dependence on other enabling machines and devices: Secondary | ICD-10-CM | POA: Insufficient documentation

## 2020-07-25 DIAGNOSIS — Z8249 Family history of ischemic heart disease and other diseases of the circulatory system: Secondary | ICD-10-CM | POA: Diagnosis not present

## 2020-07-25 DIAGNOSIS — Z952 Presence of prosthetic heart valve: Secondary | ICD-10-CM | POA: Diagnosis not present

## 2020-07-25 DIAGNOSIS — Z8619 Personal history of other infectious and parasitic diseases: Secondary | ICD-10-CM | POA: Diagnosis not present

## 2020-07-25 DIAGNOSIS — Z881 Allergy status to other antibiotic agents status: Secondary | ICD-10-CM | POA: Diagnosis not present

## 2020-07-25 DIAGNOSIS — F329 Major depressive disorder, single episode, unspecified: Secondary | ICD-10-CM | POA: Insufficient documentation

## 2020-07-25 DIAGNOSIS — Z7901 Long term (current) use of anticoagulants: Secondary | ICD-10-CM | POA: Insufficient documentation

## 2020-07-25 DIAGNOSIS — F419 Anxiety disorder, unspecified: Secondary | ICD-10-CM | POA: Insufficient documentation

## 2020-07-25 DIAGNOSIS — Z79899 Other long term (current) drug therapy: Secondary | ICD-10-CM | POA: Diagnosis not present

## 2020-07-25 DIAGNOSIS — Z882 Allergy status to sulfonamides status: Secondary | ICD-10-CM | POA: Insufficient documentation

## 2020-07-25 DIAGNOSIS — I5032 Chronic diastolic (congestive) heart failure: Secondary | ICD-10-CM | POA: Diagnosis present

## 2020-07-25 DIAGNOSIS — G2581 Restless legs syndrome: Secondary | ICD-10-CM | POA: Insufficient documentation

## 2020-07-25 DIAGNOSIS — I2510 Atherosclerotic heart disease of native coronary artery without angina pectoris: Secondary | ICD-10-CM | POA: Insufficient documentation

## 2020-07-25 DIAGNOSIS — Z8719 Personal history of other diseases of the digestive system: Secondary | ICD-10-CM | POA: Insufficient documentation

## 2020-07-25 DIAGNOSIS — I10 Essential (primary) hypertension: Secondary | ICD-10-CM

## 2020-07-25 DIAGNOSIS — Z87891 Personal history of nicotine dependence: Secondary | ICD-10-CM | POA: Diagnosis not present

## 2020-07-25 DIAGNOSIS — I110 Hypertensive heart disease with heart failure: Secondary | ICD-10-CM | POA: Diagnosis not present

## 2020-07-25 DIAGNOSIS — K219 Gastro-esophageal reflux disease without esophagitis: Secondary | ICD-10-CM | POA: Insufficient documentation

## 2020-07-25 NOTE — Telephone Encounter (Signed)
Returned call to pt's daughter [name], advised we received her message.  She states Dr Dareen Piano is managing and monitoring Warfarin in skilled nuring facility at [facility] at the present.  She anticipates pt will remain in skilled nursing until at least 2-3 more weeks, as he is receiving necessary PT and OT at the present.  [name] is aware to contact our office once pt is discharged back to independent living so we can resume management of his Warfarin.  [name] verbalized understanding and thanked me for the call back.  Will await their call back.

## 2020-07-25 NOTE — Patient Instructions (Addendum)
Continue weighing daily and call for an overnight weight gain of > 2 pounds or a weekly weight gain of >5 pounds.   Call us in the future if you'd like to schedule another appointment 

## 2020-07-25 NOTE — Telephone Encounter (Signed)
Patients daughter, [name], calling in stating that patient is still in SNF at [facility] at [place]. Dr. Dareen Piano is following him while he is a patient there and ordering his coumadin checks. Patient had a check last week and will have another next week. Daughter was advised to have the nurse send over the coumadin levels.

## 2020-07-25 NOTE — Progress Notes (Signed)
Patient ID: [name], male    DOB: 1931-06-29, 86 y.o.   MRN: [id number]  HPI  [name] is a 86 y/o male with a history of anxiety, c. Diff, CAD, depression, obstructive sleep apnea (CPAP), GERD, GI bleed, HTN, restless leg syndrome, prior tobacco use and chronic heart failure.   Echo report from 02/11/20 reviewed and showed an EF of 55-60% along with severe LAE, mild [name], moderate/severe TR and mechanical aortic valve. Echo from 02/21/17 shows an EF of 55-60% along with severe TR and moderated elevated PA pressure of 54 mm Hg.   Admitted 07/10/20 due to lower extremity cellulitis along with HF exacerbation. Cardiology and wound consults obtained. Completed antibiotics. Continue compression wraps. Initially given IV lasix with transition to oral diuretics. Metolazone added for twice weekly. Discharged after 4 days. Was in the ED 06/26/20 due to elevated INR along with weight gain after diuretic was stopped. Vitamin K given along with holding warfarin for a few days. Diuretic resumed and he was released.   He presents today for a follow-up visit although hasn't been seen since March 2019. He presents with a chief complaint of minimal shortness of breath upon moderate exertion. He describes this as chronic in nature having been present for several years. He has associated fatigue, light-headedness and pedal edema along with this. He denies any difficulty sleeping, abdominal distention, palpitations, chest pain or weight gain.   Currently has both legs wrapped in bandage and ACE wraps and his daughter says that the nurse at rehab changes the dressing twice/ week. Getting PT 5 days/ week.   Past Medical History:  Diagnosis Date  . Anxiety 10/11  . Aortic stenosis    a. s/p mechcanical AVR, 2002; b. 10/2018 Echo: Triv AI, mean grad .  . Bradycardia    chronic, no symptoms 07/2010  . C. difficile colitis   . Carotid bruit    dopplers in past, no abnormalities  . Chronic diastolic CHF  (congestive heart failure) (HCC)    a. Echo 03/2015: EF 60-65%, no RWMA, GR1DD, mild BAE, mild to mod [name], mod TR, PASP 65 mmHg; b. 01/2017 Echo: EF 55-60%, NRWMA, grade 1 diastolic dysfunction.  Normal functioning prosthetic aortic valve.  Mean gradient 50 mmHg.  Sev TR. PASP ; c. 10/2018 Echo: EF 55-60%, Triv AI, mod dil LA, mod-sev TR, PASP 35-40, mild to mod red RV fxn.  . Coronary artery disease    a. mild, cath, 08/2010; b. medically managed  . Decreased hearing    Right ear  . Depression   . Gastric ulcer   . GERD (gastroesophageal reflux disease)   . Hypertension    BP higher than usual 04/19/10; amlodipine increased by telephone  . Mod-Sev Tricuspid regurgitation    a. 10/2018 Echo: Mod-Sev TR, PASP 35-21mmHg.  Marland Kitchen RLS (restless legs syndrome) 08/23/2015  . S/P AVR    a. St. Jude. mechanical 2002; b. echo 08/2010 EF 60%, trival AI, mild [name], AVR working well; c. on longterm warfarin tx  . SOB (shortness of breath) 10/11   08/2010,Episodes at 5 AM, eventually felt to be anxiety, after complete workup including catheterization, pt greatly improved with anxiety meds 11/11   Past Surgical History:  Procedure Laterality Date  . CARDIAC CATHETERIZATION    . ESOPHAGOGASTRODUODENOSCOPY N/A 04/05/2015   Procedure: ESOPHAGOGASTRODUODENOSCOPY (EGD);  Surgeon: Scot Jun, MD;  Location: [facility] ENDOSCOPY;  Service: Endoscopy;  Laterality: N/A;  . ESOPHAGOGASTRODUODENOSCOPY N/A 04/17/2015   Procedure: ESOPHAGOGASTRODUODENOSCOPY (EGD);  Surgeon: Scot Jun, MD;  Location: [facility] ENDOSCOPY;  Service: Endoscopy;  Laterality: N/A;  . ESOPHAGOGASTRODUODENOSCOPY N/A 08/02/2015   Procedure: ESOPHAGOGASTRODUODENOSCOPY (EGD);  Surgeon: Wallace Cullens, MD;  Location: [facility] ENDOSCOPY;  Service: Endoscopy;  Laterality: N/A;  . HERNIA REPAIR    . JOINT REPLACEMENT    . TOTAL HIP ARTHROPLASTY    . VALVE REPLACEMENT  1/02   Aortic; echo 3/09 valve working well; echo 10/11 working well; put on Coumadin    Family History  Problem Relation Age of Onset  . Hypertension Mother   . Diabetes type II Mother   . Heart disease Mother   . Heart attack Mother   . Hypertension Father   . Heart disease Father   . Stroke Father   . Stroke Brother   . Stroke Brother   . Hypertension Other    Social History   Tobacco Use  . Smoking status: Former Smoker    Types: Cigarettes    Quit date: 1979    Years since quitting: 42.6  . Smokeless tobacco: Never Used  Substance Use Topics  . Alcohol use: Yes    Alcohol/week: 3.0 standard drinks    Types: 3 Glasses of wine per week   Allergies  Allergen Reactions  . Sulfa Antibiotics Other (See Comments)    Reaction:  Unknown   . Levofloxacin Nausea Only   Prior to Admission medications   Medication Sig Start Date End Date Taking? Authorizing Provider  allopurinol (ZYLOPRIM) 300 MG tablet Take 300 mg by mouth daily.  05/16/20   [provider]  amoxicillin (AMOXIL) 500 MG capsule 4 CAPSULES PRIOR TO DENTAL PROCEDURES AS DIRECTED 06/27/20   [provider]  Cetirizine HCl 10 MG CAPS Take 10 mg by mouth daily.    [provider]  cloNIDine (CATAPRES) 0.1 MG tablet TAKE 1 TABLET BY MOUTH  TWICE DAILY Patient taking differently: Take 0.1 mg by mouth 2 (two) times daily.  01/05/20   Antonieta Iba, MD  doxazosin (CARDURA) 8 MG tablet Take 8 mg by mouth 2 (two) times daily.  02/09/20   [provider]  DULoxetine (CYMBALTA) 30 MG capsule Take 1 capsule (30 mg total) by mouth daily. 07/15/20   Meredeth Ide, MD  finasteride (PROSCAR) 5 MG tablet Take 5 mg by mouth daily.      [provider]  isosorbide mononitrate (IMDUR) 60 MG 24 hr tablet Take 1 tablet (60 mg total) by mouth 2 (two) times daily. 05/05/20   Creig Hines, NP  metolazone (ZAROXOLYN) 2.5 MG tablet Take 1 tablet (2.5 mg total) by mouth 2 (two) times a week. 02/17/20 07/11/20  Antonieta Iba, MD  metoprolol succinate (TOPROL-XL) 25 MG 24  hr tablet Take 0.5 tablets (12.5 mg total) by mouth daily. 03/06/20   Antonieta Iba, MD  neomycin-polymyxin-hydrocortisone (CORTISPORIN) 3.5-10000-1 OTIC suspension Place 3 drops into the right ear 4 (four) times daily. 06/22/20   [provider]  potassium chloride (KLOR-CON) 10 MEQ tablet TAKE 3 TABLETS (30 MEQ) BY MOUTH DAILY. TAKE AN ADDITIONAL 2TABS ON THE DAYS YOU TAKE METOLAZONE 06/08/20   Gollan, Tollie Pizza, MD  rOPINIRole (REQUIP) 2 MG tablet TAKE 1 TABLET BY MOUTH 4  TIMES DAILY Patient taking differently: Take 2 mg by mouth in the morning, at noon, in the evening, and at bedtime. Take 1 tablet by mouth 4  times daily 09/14/18   Butch Penny, NP  torsemide (DEMADEX) 20 MG tablet Take 2  tablets (40 mg total) by mouth daily. Take additional 40 mg for 3 pounds weight gain . 07/14/20   Meredeth Ide, MD  traMADol (ULTRAM) 50 MG tablet Take 50 mg by mouth 2 (two) times daily as needed. Patient not taking: Reported on 07/11/2020 11/02/19   [provider]  traZODone (DESYREL) 50 MG tablet Take 50 mg by mouth at bedtime.    [provider]  warfarin (COUMADIN) 5 MG tablet TAKE 1 TABLET BY MOUTH  DAILY OR AS DIRECTED BY THE ANTICOAGULATION CLINIC Patient taking differently: Take 2.5-5 mg by mouth daily. TAKE 1 TABLET BY MOUTH  DAILY OR AS DIRECTED BY THE ANTICOAGULATION CLINIC 06/19/20   Antonieta Iba, MD    Review of Systems  Constitutional: Positive for fatigue. Negative for appetite change.  HENT: Positive for hearing loss. Negative for congestion, rhinorrhea and sore throat.   Eyes: Negative.   Respiratory: Positive for shortness of breath. Negative for cough and chest tightness.   Cardiovascular: Positive for leg swelling. Negative for chest pain and palpitations.  Gastrointestinal: Negative for abdominal distention and abdominal pain.  Endocrine: Negative.   Genitourinary: Negative.   Musculoskeletal: Negative for back pain and neck pain.  Skin: Negative.    Allergic/Immunologic: Negative.   Neurological: Positive for light-headedness. Negative for dizziness.  Hematological: Negative for adenopathy. Bruises/bleeds easily.  Psychiatric/Behavioral: Negative for dysphoric mood and sleep disturbance (wearing CPAP nightly). The patient is not nervous/anxious.    Vitals:   07/25/20 1523  BP: (!) 112/44  Pulse: 62  Resp: 18  SpO2: 100%  Weight: 213 lb (96.6 kg)  Height: 5\' 11"  (1.803 m)   Wt Readings from Last 3 Encounters:  07/25/20 213 lb (96.6 kg)  07/11/20 211 lb (95.7 kg)  07/03/20 224 lb 2 oz (101.7 kg)   Lab Results  Component Value Date   CREATININE 1.15 07/17/2020   CREATININE 1.16 07/14/2020   CREATININE 1.17 07/13/2020    Physical Exam Vitals and nursing note reviewed.  Constitutional:      Appearance: He is well-developed.  HENT:     Head: Normocephalic and atraumatic.     Right Ear: Decreased hearing noted.     Left Ear: Decreased hearing noted.  Neck:     Vascular: No JVD.  Cardiovascular:     Rate and Rhythm: Normal rate. Rhythm irregular.  Pulmonary:     Effort: Pulmonary effort is normal. No respiratory distress.     Breath sounds: No wheezing or rales.  Abdominal:     General: There is no distension.     Palpations: Abdomen is soft.     Tenderness: There is no abdominal tenderness.  Musculoskeletal:        General: No tenderness.     Cervical back: Normal range of motion and neck supple.     Right lower leg: No tenderness. Edema (wrapped in bandage/ ACE wrap) present.     Left lower leg: No tenderness. Edema (wrapped in bandage/ ACE wrap) present.  Skin:    General: Skin is warm and dry.  Neurological:     Mental Status: He is alert and oriented to person, place, and time.  Psychiatric:        Behavior: Behavior normal.        Thought Content: Thought content normal.    Assessment & Plan:  1: Chronic heart failure with preserved ejection fraction with structural changes- - NYHA class II -  euvolemic today - weighing daily and he was instructed to  call for an overnight weight gain of >2 pounds or a weekly weight gain of >5 pounds.  - not adding salt to his food but [place] [facility] [employer] [facility] [place] [facility] and he says that sometimes the food tastes salty.  - saw cardiologist Dan Humphreys(Walker) 07/03/20; was supposed to see her tomorrow but daughter is asking if he needs to be seen since he was seen here today; messaged Gillian Shieldsaitlin Walker, NP who said tomorrow's appt can be cancelled and he will keep cardiology appt already scheduled in 3 weeks - BP too low for entresto - BNP 07/11/20 was 326.8  2: HTN- - BP looks good although on the low side - [facility] - BMP from 07/17/20 reviewed and shows sodium 135, potassium 3.5, creatinine 1.15 and GFR 57  3 Permanent atrial fibrillation- - INR 07/17/20 was 3.5 - [place] [facility] [place] to check [place]   Medication list reviewed.   Due to difficulty in patient getting to appointments, patient and daughter opt to not make a return appointment at this time. Advised them that they could return at anytime to schedule another appointment or to ask any questions.

## 2020-07-26 ENCOUNTER — Ambulatory Visit: Payer: Medicare Other | Admitting: Family

## 2020-07-28 ENCOUNTER — Ambulatory Visit: Payer: Medicare Other | Admitting: Family

## 2020-07-29 ENCOUNTER — Emergency Department: Payer: Medicare Other

## 2020-07-29 ENCOUNTER — Other Ambulatory Visit: Payer: Self-pay

## 2020-07-29 ENCOUNTER — Inpatient Hospital Stay
Admission: EM | Admit: 2020-07-29 | Discharge: 2020-08-10 | DRG: 377 | Disposition: A | Discharge status: Skilled Nursing Facility | Payer: Medicare Other | Attending: Internal Medicine | Admitting: Internal Medicine

## 2020-07-29 DIAGNOSIS — F05 Delirium due to known physiological condition: Secondary | ICD-10-CM | POA: Diagnosis not present

## 2020-07-29 DIAGNOSIS — R569 Unspecified convulsions: Secondary | ICD-10-CM | POA: Diagnosis not present

## 2020-07-29 DIAGNOSIS — R578 Other shock: Secondary | ICD-10-CM

## 2020-07-29 DIAGNOSIS — Z6832 Body mass index (BMI) 32.0-32.9, adult: Secondary | ICD-10-CM

## 2020-07-29 DIAGNOSIS — E669 Obesity, unspecified: Secondary | ICD-10-CM | POA: Diagnosis present

## 2020-07-29 DIAGNOSIS — K641 Second degree hemorrhoids: Secondary | ICD-10-CM | POA: Diagnosis present

## 2020-07-29 DIAGNOSIS — D509 Iron deficiency anemia, unspecified: Secondary | ICD-10-CM | POA: Diagnosis present

## 2020-07-29 DIAGNOSIS — R791 Abnormal coagulation profile: Secondary | ICD-10-CM

## 2020-07-29 DIAGNOSIS — I5032 Chronic diastolic (congestive) heart failure: Secondary | ICD-10-CM | POA: Diagnosis present

## 2020-07-29 DIAGNOSIS — E876 Hypokalemia: Secondary | ICD-10-CM | POA: Diagnosis present

## 2020-07-29 DIAGNOSIS — E871 Hypo-osmolality and hyponatremia: Secondary | ICD-10-CM | POA: Diagnosis present

## 2020-07-29 DIAGNOSIS — Z20822 Contact with and (suspected) exposure to covid-19: Secondary | ICD-10-CM | POA: Diagnosis present

## 2020-07-29 DIAGNOSIS — Z96642 Presence of left artificial hip joint: Secondary | ICD-10-CM | POA: Diagnosis present

## 2020-07-29 DIAGNOSIS — R140 Abdominal distension (gaseous): Secondary | ICD-10-CM | POA: Diagnosis not present

## 2020-07-29 DIAGNOSIS — K449 Diaphragmatic hernia without obstruction or gangrene: Secondary | ICD-10-CM | POA: Diagnosis present

## 2020-07-29 DIAGNOSIS — Z881 Allergy status to other antibiotic agents status: Secondary | ICD-10-CM

## 2020-07-29 DIAGNOSIS — Z7901 Long term (current) use of anticoagulants: Secondary | ICD-10-CM

## 2020-07-29 DIAGNOSIS — Z8249 Family history of ischemic heart disease and other diseases of the circulatory system: Secondary | ICD-10-CM

## 2020-07-29 DIAGNOSIS — K228 Other specified diseases of esophagus: Secondary | ICD-10-CM | POA: Diagnosis present

## 2020-07-29 DIAGNOSIS — Z9189 Other specified personal risk factors, not elsewhere classified: Secondary | ICD-10-CM

## 2020-07-29 DIAGNOSIS — T45515A Adverse effect of anticoagulants, initial encounter: Secondary | ICD-10-CM | POA: Diagnosis present

## 2020-07-29 DIAGNOSIS — K5731 Diverticulosis of large intestine without perforation or abscess with bleeding: Principal | ICD-10-CM | POA: Diagnosis present

## 2020-07-29 DIAGNOSIS — D62 Acute posthemorrhagic anemia: Secondary | ICD-10-CM | POA: Diagnosis present

## 2020-07-29 DIAGNOSIS — R570 Cardiogenic shock: Secondary | ICD-10-CM | POA: Diagnosis present

## 2020-07-29 DIAGNOSIS — D689 Coagulation defect, unspecified: Secondary | ICD-10-CM | POA: Diagnosis present

## 2020-07-29 DIAGNOSIS — G9341 Metabolic encephalopathy: Secondary | ICD-10-CM | POA: Diagnosis present

## 2020-07-29 DIAGNOSIS — D6859 Other primary thrombophilia: Secondary | ICD-10-CM | POA: Diagnosis present

## 2020-07-29 DIAGNOSIS — R410 Disorientation, unspecified: Secondary | ICD-10-CM | POA: Diagnosis not present

## 2020-07-29 DIAGNOSIS — E872 Acidosis, unspecified: Secondary | ICD-10-CM | POA: Diagnosis present

## 2020-07-29 DIAGNOSIS — E870 Hyperosmolality and hypernatremia: Secondary | ICD-10-CM | POA: Diagnosis not present

## 2020-07-29 DIAGNOSIS — K922 Gastrointestinal hemorrhage, unspecified: Secondary | ICD-10-CM

## 2020-07-29 DIAGNOSIS — K560 Paralytic ileus: Secondary | ICD-10-CM | POA: Diagnosis present

## 2020-07-29 DIAGNOSIS — Z87891 Personal history of nicotine dependence: Secondary | ICD-10-CM

## 2020-07-29 DIAGNOSIS — I5033 Acute on chronic diastolic (congestive) heart failure: Secondary | ICD-10-CM | POA: Diagnosis present

## 2020-07-29 DIAGNOSIS — Z79899 Other long term (current) drug therapy: Secondary | ICD-10-CM

## 2020-07-29 DIAGNOSIS — G2581 Restless legs syndrome: Secondary | ICD-10-CM | POA: Diagnosis present

## 2020-07-29 DIAGNOSIS — Z8679 Personal history of other diseases of the circulatory system: Secondary | ICD-10-CM | POA: Diagnosis not present

## 2020-07-29 DIAGNOSIS — Z515 Encounter for palliative care: Secondary | ICD-10-CM | POA: Diagnosis not present

## 2020-07-29 DIAGNOSIS — I272 Pulmonary hypertension, unspecified: Secondary | ICD-10-CM | POA: Diagnosis present

## 2020-07-29 DIAGNOSIS — I4891 Unspecified atrial fibrillation: Secondary | ICD-10-CM

## 2020-07-29 DIAGNOSIS — L8990 Pressure ulcer of unspecified site, unspecified stage: Secondary | ICD-10-CM | POA: Insufficient documentation

## 2020-07-29 DIAGNOSIS — L89152 Pressure ulcer of sacral region, stage 2: Secondary | ICD-10-CM | POA: Diagnosis present

## 2020-07-29 DIAGNOSIS — R062 Wheezing: Secondary | ICD-10-CM | POA: Diagnosis not present

## 2020-07-29 DIAGNOSIS — I878 Other specified disorders of veins: Secondary | ICD-10-CM | POA: Diagnosis present

## 2020-07-29 DIAGNOSIS — R778 Other specified abnormalities of plasma proteins: Secondary | ICD-10-CM

## 2020-07-29 DIAGNOSIS — I110 Hypertensive heart disease with heart failure: Secondary | ICD-10-CM | POA: Diagnosis present

## 2020-07-29 DIAGNOSIS — Z952 Presence of prosthetic heart valve: Secondary | ICD-10-CM

## 2020-07-29 DIAGNOSIS — F32A Depression, unspecified: Secondary | ICD-10-CM | POA: Diagnosis present

## 2020-07-29 DIAGNOSIS — H9190 Unspecified hearing loss, unspecified ear: Secondary | ICD-10-CM | POA: Diagnosis present

## 2020-07-29 DIAGNOSIS — Z7189 Other specified counseling: Secondary | ICD-10-CM | POA: Diagnosis not present

## 2020-07-29 DIAGNOSIS — I872 Venous insufficiency (chronic) (peripheral): Secondary | ICD-10-CM | POA: Diagnosis present

## 2020-07-29 DIAGNOSIS — Z882 Allergy status to sulfonamides status: Secondary | ICD-10-CM

## 2020-07-29 DIAGNOSIS — R0902 Hypoxemia: Secondary | ICD-10-CM

## 2020-07-29 DIAGNOSIS — R7989 Other specified abnormal findings of blood chemistry: Secondary | ICD-10-CM

## 2020-07-29 DIAGNOSIS — I4892 Unspecified atrial flutter: Secondary | ICD-10-CM | POA: Diagnosis present

## 2020-07-29 DIAGNOSIS — F329 Major depressive disorder, single episode, unspecified: Secondary | ICD-10-CM | POA: Diagnosis present

## 2020-07-29 DIAGNOSIS — I350 Nonrheumatic aortic (valve) stenosis: Secondary | ICD-10-CM | POA: Diagnosis present

## 2020-07-29 DIAGNOSIS — J9601 Acute respiratory failure with hypoxia: Secondary | ICD-10-CM | POA: Diagnosis present

## 2020-07-29 DIAGNOSIS — I4821 Permanent atrial fibrillation: Secondary | ICD-10-CM | POA: Diagnosis present

## 2020-07-29 DIAGNOSIS — K219 Gastro-esophageal reflux disease without esophagitis: Secondary | ICD-10-CM | POA: Diagnosis present

## 2020-07-29 DIAGNOSIS — F419 Anxiety disorder, unspecified: Secondary | ICD-10-CM | POA: Diagnosis present

## 2020-07-29 DIAGNOSIS — E440 Moderate protein-calorie malnutrition: Secondary | ICD-10-CM | POA: Diagnosis present

## 2020-07-29 DIAGNOSIS — Z8719 Personal history of other diseases of the digestive system: Secondary | ICD-10-CM

## 2020-07-29 DIAGNOSIS — Z8711 Personal history of peptic ulcer disease: Secondary | ICD-10-CM

## 2020-07-29 DIAGNOSIS — R001 Bradycardia, unspecified: Secondary | ICD-10-CM | POA: Diagnosis present

## 2020-07-29 DIAGNOSIS — R159 Full incontinence of feces: Secondary | ICD-10-CM | POA: Diagnosis present

## 2020-07-29 DIAGNOSIS — G4733 Obstructive sleep apnea (adult) (pediatric): Secondary | ICD-10-CM | POA: Diagnosis present

## 2020-07-29 DIAGNOSIS — I071 Rheumatic tricuspid insufficiency: Secondary | ICD-10-CM | POA: Diagnosis present

## 2020-07-29 DIAGNOSIS — I2510 Atherosclerotic heart disease of native coronary artery without angina pectoris: Secondary | ICD-10-CM | POA: Diagnosis present

## 2020-07-29 DIAGNOSIS — D649 Anemia, unspecified: Secondary | ICD-10-CM

## 2020-07-29 DIAGNOSIS — M1612 Unilateral primary osteoarthritis, left hip: Secondary | ICD-10-CM | POA: Diagnosis present

## 2020-07-29 DIAGNOSIS — R0602 Shortness of breath: Secondary | ICD-10-CM | POA: Diagnosis present

## 2020-07-29 DIAGNOSIS — M109 Gout, unspecified: Secondary | ICD-10-CM | POA: Diagnosis present

## 2020-07-29 LAB — URINALYSIS, COMPLETE (UACMP) WITH MICROSCOPIC
Bacteria, UA: NONE SEEN
Bilirubin Urine: NEGATIVE
Glucose, UA: NEGATIVE mg/dL
Hgb urine dipstick: NEGATIVE
Ketones, ur: NEGATIVE mg/dL
Leukocytes,Ua: NEGATIVE
Nitrite: NEGATIVE
Protein, ur: NEGATIVE mg/dL
Specific Gravity, Urine: 1.013 (ref 1.005–1.030)
Squamous Epithelial / HPF: NONE SEEN (ref 0–5)
pH: 5 (ref 5.0–8.0)

## 2020-07-29 LAB — CBC WITH DIFFERENTIAL/PLATELET
Abs Immature Granulocytes: 0.07 10*3/uL (ref 0.00–0.07)
Basophils Absolute: 0 10*3/uL (ref 0.0–0.1)
Basophils Relative: 0 %
Eosinophils Absolute: 0 10*3/uL (ref 0.0–0.5)
Eosinophils Relative: 0 %
HCT: 13 % — CL (ref 39.0–52.0)
Hemoglobin: 4.1 g/dL — CL (ref 13.0–17.0)
Immature Granulocytes: 1 %
Lymphocytes Relative: 14 %
Lymphs Abs: 1.4 10*3/uL (ref 0.7–4.0)
MCH: 28.7 pg (ref 26.0–34.0)
MCHC: 31.5 g/dL (ref 30.0–36.0)
MCV: 90.9 fL (ref 80.0–100.0)
Monocytes Absolute: 0.8 10*3/uL (ref 0.1–1.0)
Monocytes Relative: 8 %
Neutro Abs: 8 10*3/uL — ABNORMAL HIGH (ref 1.7–7.7)
Neutrophils Relative %: 77 %
Platelets: 257 10*3/uL (ref 150–400)
RBC: 1.43 MIL/uL — ABNORMAL LOW (ref 4.22–5.81)
RDW: 17.5 % — ABNORMAL HIGH (ref 11.5–15.5)
WBC: 10.3 10*3/uL (ref 4.0–10.5)
nRBC: 0 % (ref 0.0–0.2)

## 2020-07-29 LAB — HEMOGLOBIN AND HEMATOCRIT, BLOOD
HCT: 18.4 % — ABNORMAL LOW (ref 39.0–52.0)
HCT: 16.7 % — ABNORMAL LOW (ref 39.0–52.0)
HCT: 17.2 % — ABNORMAL LOW (ref 39.0–52.0)
Hemoglobin: 6.3 g/dL — ABNORMAL LOW (ref 13.0–17.0)
Hemoglobin: 5.6 g/dL — ABNORMAL LOW (ref 13.0–17.0)
Hemoglobin: 5.7 g/dL — ABNORMAL LOW (ref 13.0–17.0)

## 2020-07-29 LAB — COMPREHENSIVE METABOLIC PANEL
ALT: 39 U/L (ref 0–44)
AST: 67 U/L — ABNORMAL HIGH (ref 15–41)
Albumin: 2.5 g/dL — ABNORMAL LOW (ref 3.5–5.0)
Alkaline Phosphatase: 160 U/L — ABNORMAL HIGH (ref 38–126)
Anion gap: 12 (ref 5–15)
BUN: 120 mg/dL — ABNORMAL HIGH (ref 8–23)
CO2: 27 mmol/L (ref 22–32)
Calcium: 8.2 mg/dL — ABNORMAL LOW (ref 8.9–10.3)
Chloride: 103 mmol/L (ref 98–111)
Creatinine, Ser: 1.42 mg/dL — ABNORMAL HIGH (ref 0.61–1.24)
GFR calc Af Amer: 51 mL/min — ABNORMAL LOW (ref >60)
GFR calc non Af Amer: 44 mL/min — ABNORMAL LOW (ref >60)
Glucose, Bld: 139 mg/dL — ABNORMAL HIGH (ref 70–99)
Potassium: 3.5 mmol/L (ref 3.5–5.1)
Sodium: 142 mmol/L (ref 135–145)
Total Bilirubin: 0.8 mg/dL (ref 0.3–1.2)
Total Protein: 4.8 g/dL — ABNORMAL LOW (ref 6.5–8.1)

## 2020-07-29 LAB — PROTIME-INR
INR: >10 (ref 0.8–1.2)
INR: 2.6 — ABNORMAL HIGH (ref 0.8–1.2)
INR: 1.5 — ABNORMAL HIGH (ref 0.8–1.2)
Prothrombin Time: >90 seconds — ABNORMAL HIGH (ref 11.4–15.2)
Prothrombin Time: 26.7 seconds — ABNORMAL HIGH (ref 11.4–15.2)
Prothrombin Time: 17.1 seconds — ABNORMAL HIGH (ref 11.4–15.2)

## 2020-07-29 LAB — APTT: aPTT: 55 seconds — ABNORMAL HIGH (ref 24–36)

## 2020-07-29 LAB — FIBRIN DERIVATIVES D-DIMER (ARMC ONLY): Fibrin derivatives D-dimer (ARMC): 289.24 ng/mL (FEU) (ref 0.00–499.00)

## 2020-07-29 LAB — PLATELET COUNT: Platelets: 222 10*3/uL (ref 150–400)

## 2020-07-29 LAB — TROPONIN I (HIGH SENSITIVITY)
Troponin I (High Sensitivity): 82 ng/L — ABNORMAL HIGH (ref <18)
Troponin I (High Sensitivity): 80 ng/L — ABNORMAL HIGH (ref <18)

## 2020-07-29 LAB — LACTIC ACID, PLASMA
Lactic Acid, Venous: 4.3 mmol/L (ref 0.5–1.9)
Lactic Acid, Venous: 2.7 mmol/L (ref 0.5–1.9)

## 2020-07-29 LAB — BRAIN NATRIURETIC PEPTIDE: B Natriuretic Peptide: 373.7 pg/mL — ABNORMAL HIGH (ref 0.0–100.0)

## 2020-07-29 LAB — TECHNOLOGIST SMEAR REVIEW: Plt Morphology: NORMAL

## 2020-07-29 LAB — MASSIVE TRANSFUSION PROTOCOL ORDER (BLOOD BANK NOTIFICATION)

## 2020-07-29 LAB — PREPARE RBC (CROSSMATCH)

## 2020-07-29 LAB — MAGNESIUM: Magnesium: 2.6 mg/dL — ABNORMAL HIGH (ref 1.7–2.4)

## 2020-07-29 LAB — SARS CORONAVIRUS 2 BY RT PCR (HOSPITAL ORDER, PERFORMED IN ~~LOC~~ HOSPITAL LAB): SARS Coronavirus 2: NEGATIVE

## 2020-07-29 LAB — FIBRINOGEN: Fibrinogen: 384 mg/dL (ref 210–475)

## 2020-07-29 MED ORDER — PANTOPRAZOLE SODIUM 40 MG IV SOLR
40.0000 mg | Freq: Two times a day (BID) | INTRAVENOUS | Status: DC
  Administered 2020-07-29 – 2020-07-31 (×4): 40 mg via INTRAVENOUS
  Filled 2020-07-29 (×4): qty 40

## 2020-07-29 MED ORDER — METRONIDAZOLE IN NACL 5-0.79 MG/ML-% IV SOLN
500.0000 mg | Freq: Once | INTRAVENOUS | Status: AC
  Administered 2020-07-29: 500 mg via INTRAVENOUS
  Filled 2020-07-29: qty 100

## 2020-07-29 MED ORDER — SODIUM CHLORIDE 0.9% IV SOLUTION
Freq: Once | INTRAVENOUS | Status: AC
  Filled 2020-07-29: qty 250

## 2020-07-29 MED ORDER — METOPROLOL SUCCINATE ER 25 MG PO TB24
12.5000 mg | ORAL_TABLET | Freq: Every day | ORAL | Status: DC
  Administered 2020-07-29 – 2020-08-10 (×11): 12.5 mg via ORAL
  Filled 2020-07-29: qty 1
  Filled 2020-07-29: qty 0.5
  Filled 2020-07-29 (×5): qty 1
  Filled 2020-07-29: qty 0.5
  Filled 2020-07-29 (×3): qty 1

## 2020-07-29 MED ORDER — VANCOMYCIN HCL 1500 MG/300ML IV SOLN: 1500.0000 mg | INTRAVENOUS | Status: DC

## 2020-07-29 MED ORDER — VANCOMYCIN HCL IN DEXTROSE 1-5 GM/200ML-% IV SOLN
1000.0000 mg | Freq: Once | INTRAVENOUS | Status: AC
  Administered 2020-07-29: 1000 mg via INTRAVENOUS
  Filled 2020-07-29: qty 200

## 2020-07-29 MED ORDER — PIPERACILLIN-TAZOBACTAM 3.375 G IVPB
3.3750 g | Freq: Three times a day (TID) | INTRAVENOUS | Status: DC
  Administered 2020-07-29 – 2020-07-30 (×2): 3.375 g via INTRAVENOUS
  Filled 2020-07-29 (×2): qty 50

## 2020-07-29 MED ORDER — SODIUM CHLORIDE 0.9 % IV SOLN
2.0000 g | Freq: Once | INTRAVENOUS | Status: AC
  Administered 2020-07-29: 2 g via INTRAVENOUS
  Filled 2020-07-29: qty 2

## 2020-07-29 MED ORDER — IOHEXOL 350 MG/ML SOLN
80.0000 mL | Freq: Once | INTRAVENOUS | Status: AC | PRN
  Administered 2020-07-29: 80 mL via INTRAVENOUS

## 2020-07-29 MED ORDER — SODIUM CHLORIDE 0.9 % IV SOLN: INTRAVENOUS | Status: DC

## 2020-07-29 MED ORDER — PANTOPRAZOLE SODIUM 40 MG IV SOLR
40.0000 mg | Freq: Once | INTRAVENOUS | Status: AC
  Administered 2020-07-29: 40 mg via INTRAVENOUS
  Filled 2020-07-29: qty 40

## 2020-07-29 MED ORDER — VITAMIN K1 10 MG/ML IJ SOLN
10.0000 mg | INTRAVENOUS | Status: AC
  Administered 2020-07-29: 10 mg via INTRAVENOUS
  Filled 2020-07-29: qty 1

## 2020-07-29 MED ORDER — ONDANSETRON HCL 4 MG PO TABS: 4.0000 mg | ORAL_TABLET | Freq: Four times a day (QID) | ORAL | Status: DC | PRN

## 2020-07-29 MED ORDER — ONDANSETRON HCL 4 MG/2ML IJ SOLN: 4.0000 mg | Freq: Four times a day (QID) | INTRAMUSCULAR | Status: DC | PRN

## 2020-07-29 MED ORDER — ROPINIROLE HCL 1 MG PO TABS
2.0000 mg | ORAL_TABLET | Freq: Four times a day (QID) | ORAL | Status: DC
  Administered 2020-07-29 – 2020-08-10 (×40): 2 mg via ORAL
  Filled 2020-07-29 (×43): qty 2

## 2020-07-29 MED ORDER — VANCOMYCIN HCL IN DEXTROSE 1-5 GM/200ML-% IV SOLN: 1000.0000 mg | Freq: Once | INTRAVENOUS | Status: DC

## 2020-07-29 MED ORDER — SODIUM CHLORIDE 0.9 % IV SOLN: 2.0000 g | Freq: Two times a day (BID) | INTRAVENOUS | Status: DC

## 2020-07-29 MED ORDER — PROTHROMBIN COMPLEX CONC HUMAN 500 UNITS IV KIT
2142.0000 [IU] | PACK | Status: AC
  Administered 2020-07-29: 2142 [IU] via INTRAVENOUS
  Filled 2020-07-29: qty 2142

## 2020-07-29 MED ORDER — LACTATED RINGERS IV BOLUS
1000.0000 mL | Freq: Once | INTRAVENOUS | Status: AC
  Administered 2020-07-29: 1000 mL via INTRAVENOUS

## 2020-07-29 MED ORDER — SODIUM CHLORIDE 0.9 % IV SOLN
50.0000 ug/h | INTRAVENOUS | Status: DC
  Administered 2020-07-29 (×2): 50 ug/h via INTRAVENOUS
  Filled 2020-07-29 (×4): qty 1

## 2020-07-29 NOTE — ED Notes (Signed)
Unused blood product returned to blood bank.

## 2020-07-29 NOTE — ED Notes (Signed)
Daughter at bedside.

## 2020-07-29 NOTE — ED Notes (Signed)
Gastroenterologist at bedside, discussing care with daughter.

## 2020-07-29 NOTE — ED Notes (Signed)
Pt to CT and back.

## 2020-07-29 NOTE — ED Notes (Signed)
MD at bedside. Legs inspected. Bilateral cellulitis lower legs.

## 2020-07-29 NOTE — Consult Note (Signed)
GI Inpatient Consult Note  Reason for Consult: Profound anemia, GI Bleed   Attending Requesting Consult: Dr. Antoine Primas, MD  History of Present Illness: [name] is a 86 y.o. male seen for evaluation of profound anemia, GI bleeding at the request of Dr. Antoine Primas. Pt has a PMH of aortic stenosis s/p mechanical TAVR on Coumadin, chronic diastolic CHF, HTN, CAD, chronic venous stasis, GERD, and Hx of c diff. Pt was found by staff at [facility] this morning lethargic and without his CPAP so EMS was called. O2 sats were in low 80s and EMS got O2 sats back up to 90. Upon presentation to the ED, he was found to have profound anemia with hemoglobin 4.1, hematocrit 13%, and supratherapeutic INR >10. Previous hemoglobin was 9 obtained on 07/17/20. He was emergently transfused 1 unit prBCs. Per ED physician, there was melanotic stool on exam which was guaiac positive. No overt gastrointestinal blood loss. He was given Vitamin K and FFP. Repeat INR 2.6. Patient denies any fever, nausea, vomiting, abdominal pain, diarrhea, or constipation. Patient denies any acute complaints today. He only requests his hearing aids and glasses. His daughter in in route to come to the hospital to be with him. Per chart review, patient has history of gastric ulcer and last EGD 07/2015. CTA performed today showed no evidence of active GI bleeding, colonic diverticulosis without evidence for diverticulitis, large hiatal hernia, and no other acute findings.    Last Colonoscopy: Unknown  Last Endoscopy:  03/2015 (Dr. Mechele Collin) - Normal esophagus. - Medium-sized hiatus hernia. - Erosion/ulceration of proximal body where it folds to form hiatal hernia - Normal duodenal bulb and 2nd  07/2015 (Dr. Bluford Kaufmann): - Normal esophagus. - Medium-sized hiatus hernia. - Gastritis. Biopsied. - The examination was otherwise normal. - Normal examined duodenum.   Past Medical History:  Past Medical History:  Diagnosis Date   . Anxiety 10/11  . Aortic stenosis    a. s/p mechcanical AVR, 2002; b. 10/2018 Echo: Triv AI, mean grad .  . Bradycardia    chronic, no symptoms 07/2010  . C. difficile colitis   . Carotid bruit    dopplers in past, no abnormalities  . Chronic diastolic CHF (congestive heart failure) (HCC)    a. Echo 03/2015: EF 60-65%, no RWMA, GR1DD, mild BAE, mild to mod MR, mod TR, PASP 65 mmHg; b. 01/2017 Echo: EF 55-60%, NRWMA, grade 1 diastolic dysfunction.  Normal functioning prosthetic aortic valve.  Mean gradient 50 mmHg.  Sev TR. PASP ; c. 10/2018 Echo: EF 55-60%, Triv AI, mod dil LA, mod-sev TR, PASP 35-40, mild to mod red RV fxn.  . Coronary artery disease    a. mild, cath, 08/2010; b. medically managed  . Decreased hearing    Right ear  . Depression   . Gastric ulcer   . GERD (gastroesophageal reflux disease)   . Hypertension    BP higher than usual 04/19/10; amlodipine increased by telephone  . Mod-Sev Tricuspid regurgitation    a. 10/2018 Echo: Mod-Sev TR, PASP 35-41mmHg.  Marland Kitchen RLS (restless legs syndrome) 08/23/2015  . S/P AVR    a. St. Jude. mechanical 2002; b. echo 08/2010 EF 60%, trival AI, mild MR, AVR working well; c. on longterm warfarin tx  . SOB (shortness of breath) 10/11   08/2010,Episodes at 5 AM, eventually felt to be anxiety, after complete workup including catheterization, pt greatly improved with anxiety meds 11/11    Problem List: Patient Active Problem List   Diagnosis  Date Noted  . Acute blood loss anemia 07/29/2020  . AKI (acute kidney injury) (HCC) 07/11/2020  . Acute on chronic heart failure with preserved ejection fraction (HFpEF) (HCC)   . Permanent atrial fibrillation (HCC)   . Cellulitis 07/10/2020  . 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 Varicose veins of leg with swelling, right 02/08/2020  . 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 Greater trochanteric pain syndrome 11/02/2019  . Lower limb  ulcer, ankle, left, limited to breakdown of skin (HCC) 11/02/2019  . H/O atrial flutter 07/15/2018  . Obstructive sleep apnea 10/23/2017  . Chronic diastolic CHF (congestive heart failure) (HCC) 09/24/2017  . Lymphedema 09/24/2017  . Chronic hyponatremia 08/12/2017  . Encounter for anticoagulation discussion and counseling 02/24/2017  . Bilateral leg edema 09/25/2015  . RLS (restless legs syndrome) 08/23/2015  . C. difficile colitis 05/26/2015  . Blood loss anemia   . History of mechanical aortic valve replacement   . Anemia 04/02/2015  . GERD (gastroesophageal reflux disease)   . Bradycardia   . Depression   . Anxiety   . Hypertension   . Decreased hearing   . Coronary artery disease   . Aortic stenosis   . Warfarin anticoagulation   . Carotid bruit     Past Surgical History: Past Surgical History:  Procedure Laterality Date  . CARDIAC CATHETERIZATION    . ESOPHAGOGASTRODUODENOSCOPY N/A 04/05/2015   Procedure: ESOPHAGOGASTRODUODENOSCOPY (EGD);  Surgeon: [name], MD;  [facility];  Service: Endoscopy;  Laterality: N/A;  . ESOPHAGOGASTRODUODENOSCOPY N/A 04/17/2015   Procedure: ESOPHAGOGASTRODUODENOSCOPY (EGD);  Surgeon: [name], MD;  [facility];  Service: Endoscopy;  Laterality: N/A;  . ESOPHAGOGASTRODUODENOSCOPY N/A 08/02/2015   Procedure: ESOPHAGOGASTRODUODENOSCOPY (EGD);  Surgeon: [name], MD;  [facility];  Service: Endoscopy;  Laterality: N/A;  . HERNIA REPAIR    . JOINT REPLACEMENT    . TOTAL HIP ARTHROPLASTY    . VALVE REPLACEMENT  1/02   Aortic; echo 3/09 valve working well; echo 10/11 working well; put on Coumadin    Allergies: Allergies  Allergen Reactions  . Sulfa Antibiotics Other (See Comments)    Reaction:  Unknown   . Levofloxacin Nausea Only    Home Medications: (Not in a hospital admission)  Home medication reconciliation was completed with the patient.   Scheduled Inpatient Medications:   .  sodium chloride   Intravenous Once  . rOPINIRole  2 mg Oral QID    Continuous Inpatient Infusions:   . [START ON 07/30/2020] ceFEPime (MAXIPIME) IV    . metronidazole 500 mg (07/29/20 1219)  . octreotide  (SANDOSTATIN)    IV infusion 50 mcg/hr (07/29/20 1210)  . vancomycin 1,000 mg (07/29/20 1220)  . vancomycin    . [START ON 07/30/2020] vancomycin      PRN Inpatient Medications:    Family History: family history includes Diabetes type II in his mother; Heart attack in his mother; Heart disease in his father and mother; Hypertension in his father, mother, and another family member; Stroke in his brother, brother, and father.  The patient's family history is negative for inflammatory bowel disorders, GI malignancy, or solid organ transplantation.  Social History:   reports that he quit smoking about 42 years ago. His smoking use included cigarettes. He has never used smokeless tobacco. He reports current alcohol use of about 3.0 standard drinks of alcohol per week. He reports that he does not use drugs. The  patient denies ETOH, tobacco, or drug use.   Review of Systems:  Unable to obtain due to hard of hearing  See HPI for other ROS   Physical Examination: BP 112/76   Pulse 92   Temp (!) 97.5 F (36.4 C) (Axillary)   Resp 14   Ht 5\' 11"  (1.803 m)   Wt 96.3 kg   SpO2 95%   BMI 29.60 kg/m    Chronically ill-appearing male in ED stretcher in no acute respiratory distress. No accessory muscle use when breathing. History difficult to obtain due to hard of hearing. No family bedside at time of exam. Gen: NAD, alert and oriented x 4 HEENT: PEERLA, EOMI, Neck: supple, no JVD or thyromegaly Chest: CTA bilaterally, no wheezes, crackles, or other adventitious sounds CV: RRR, no m/g/c/r Abd: soft, NT, ND, +BS in all four quadrants; no HSM, guarding, ridigity, or rebound tenderness Ext: no edema, well perfused with 2+ pulses, Skin: no rash or lesions noted Lymph: no LAD  Data: Lab  Results  Component Value Date   WBC 10.3 07/29/2020   HGB 6.3 (L) 07/29/2020   HCT 18.4 (L) 07/29/2020   MCV 90.9 07/29/2020   PLT 222 07/29/2020   Recent Labs  Lab 07/29/20 0903 07/29/20 1138  HGB 4.1* 6.3*   Lab Results  Component Value Date   NA 142 07/29/2020   K 3.5 07/29/2020   CL 103 07/29/2020   CO2 27 07/29/2020   BUN 120 (H) 07/29/2020   CREATININE 1.42 (H) 07/29/2020   Lab Results  Component Value Date   ALT 39 07/29/2020   AST 67 (H) 07/29/2020   ALKPHOS 160 (H) 07/29/2020   BILITOT 0.8 07/29/2020   Recent Labs  Lab 07/29/20 1138  APTT 55*  INR 2.6*   CTA 07/29/2020: IMPRESSION: VASCULAR  1.  Aortic Atherosclerosis (ICD10-170.0). 2. Cardiomegaly with biatrial enlargement. 3. Incompletely imaged aortic valve replacement. 4. Bilateral mild to moderate renal artery stenoses.  NON-VASCULAR  1. No evidence of active GI bleeding. Of note, a portion of the sigmoid colon is suboptimally evaluated secondary to extensive streak artifact from bilateral hip joint prostheses. 2. Colonic diverticular disease without CT evidence of active inflammation. 3. Cholelithiasis. 4. Large hiatal hernia. 5. Large benign right lower pole renal cyst. 6. Prostatomegaly. 7. Lower lumbar degenerative disc disease.  ADDENDUM: Additional reformats of the anatomic pelvis were performed resulting in reduced streak artifact and improved visualization of the sigmoid colon. There is no evidence of active GI bleeding in the previously obscured sections of the sigmoid colon. Visualization is now adequate to exclude active bleeding.   Assessment/Plan:  86 y/o Caucasian male with a PMH of aortic stenosis s/p mechanical TAVR on Coumadin, chronic diastolic CHF, HTN, CAD, chronic venous stasis, GERD, and Hx of c diff presented to the ED this morning via EMS from SNF for respiratory distress and lethargy  1. Profound anemia - hemoglobin 4.1, heme positive stool with no overt  gastrointestinal blood loss. CTA negative for any active GI bleeding.  2. Supratherapeutic INR - INR >10, PT >90 on admission. He received FFP and Vitamin K with repeat INR 2.6.  3. Hx of gastric ulcer - found on endoscopy 2016  4. Hemorrhagic shock/Sepsis - receiving IVF and broad spectrum antibiotics  5. Atrial fibrillation    Recommendations:  -Profound anemia with drop in hemoglobin to 4.1 from 9 two weeks prior with melanotic stool is concerning for potential GI bleed, most likely UGI etiology -DDx includes peptic ulcer disease  given history of previous gastric ulcer, gastritis +/- H pylori, gastritis, esophagitis, duodenitis, AVMs, varices, malignancy, colon polyp, etc -Agree with IV PPI for gastric protection. Continue octreotide. -Continue Sepsis protocol -Hold Warfarin given profound anemia and concern for GIB -CTA negative for active GI bleed -I advise letting the INR drift down slowly -Continue to monitor H&H closely and transfuse as needed for Hgb <7.0. Goal hemoglobin >7.0. -Advise EGD and colonoscopy when clinically feasible, ideally when hgb >7.0 and INR at least <1.7 -Following along with you  Thank you for the consult. Please call with questions or concerns.  [facility] Gastroenterology [phone] (Cell)

## 2020-07-29 NOTE — ED Notes (Signed)
MD aware of pt VS

## 2020-07-29 NOTE — ED Notes (Signed)
FFP verified with Lenise Arena

## 2020-07-29 NOTE — ED Notes (Signed)
Pt down to 4L - sats remain stable

## 2020-07-29 NOTE — ED Notes (Signed)
MD ordered emergency release of blood. Bliood bank called.

## 2020-07-29 NOTE — Progress Notes (Addendum)
CODE SEPSIS - PHARMACY COMMUNICATION  **Broad Spectrum Antibiotics should be administered within 1 hour of Sepsis diagnosis**  Time Code Sepsis Called/Page Received: 0940  Antibiotics Ordered:  Cefepime 2g IV Vancomycin 1000mg  IV Metronidazole 500mg  IV  Time of 1st antibiotic administration: 1155  Additional action taken by pharmacy: Called nurse at 1010 to notify 30 min left for code sepsis    , PharmD Pharmacy Resident  07/29/2020 9:56 AM

## 2020-07-29 NOTE — ED Notes (Signed)
Assumed care of pt at 1900, O2 97-100% on CPAP. Pt transferred and repositioned to hospital bed. Trial-ing on RA, O2 sat 97-100% at this time with regular and unlabored breathing. Medicated per MAR. Ice chips provided per patient request. AO x4, hard of hearing.

## 2020-07-29 NOTE — ED Notes (Signed)
This RN answered call light in room. No needs expressed at this time by patient.

## 2020-07-29 NOTE — ED Provider Notes (Signed)
[facility] Emergency Department Provider Note  ____________________________________________   First MD Initiated Contact with Patient 07/29/20 [phone]     (approximate)  I have reviewed the triage vital signs and the nursing notes.   HISTORY  Chief Complaint Respiratory Distress   HPI [name] is a 86 y.o. male  with a history of anxiety, c. Diff, CAD, status post mechanical valve placement on Coumadin, depression, obstructive sleep apnea (CPAP), GERD, GI bleed, HTN, restless leg syndrome, prior tobacco use and chronic heart failure who presents via EMS from SNF with concerns for respiratory distress.  Patient states she feels little short of breath with otherwise denies any acute complaints including cough, chest pain abdominal pain, vomiting, diarrhea, dysuria, rash, recent falls or injuries.  He denies any obvious bleeding in his stool.  He is not sure if he wore his CPAP last night.  Further history is somewhat limited from the patient as he is tachypneic and very hard of hearing.     Past Medical History:  Diagnosis Date  . Anxiety 10/11  . Aortic stenosis    a. s/p mechcanical AVR, 2002; b. 10/2018 Echo: Triv AI, mean grad .  . Bradycardia    chronic, no symptoms 07/2010  . C. difficile colitis   . Carotid bruit    dopplers in past, no abnormalities  . Chronic diastolic CHF (congestive heart failure) (HCC)    a. Echo 03/2015: EF 60-65%, no RWMA, GR1DD, mild BAE, mild to mod MR, mod TR, PASP 65 mmHg; b. 01/2017 Echo: EF 55-60%, NRWMA, grade 1 diastolic dysfunction.  Normal functioning prosthetic aortic valve.  Mean gradient 50 mmHg.  Sev TR. PASP ; c. 10/2018 Echo: EF 55-60%, Triv AI, mod dil LA, mod-sev TR, PASP 35-40, mild to mod red RV fxn.  . Coronary artery disease    a. mild, cath, 08/2010; b. medically managed  . Decreased hearing    Right ear  . Depression   . Gastric ulcer   . GERD (gastroesophageal reflux disease)   .  Hypertension    BP higher than usual 04/19/10; amlodipine increased by telephone  . Mod-Sev Tricuspid regurgitation    a. 10/2018 Echo: Mod-Sev TR, PASP 35-38mmHg.  Marland Kitchen RLS (restless legs syndrome) 08/23/2015  . S/P AVR    a. [employer]. mechanical 2002; b. echo 08/2010 EF 60%, trival AI, mild MR, AVR working well; c. on longterm warfarin tx  . SOB (shortness of breath) 10/11   08/2010,Episodes at 5 AM, eventually felt to be anxiety, after complete workup including catheterization, pt greatly improved with anxiety meds 11/11    Patient Active Problem List   Diagnosis Date Noted  . Acute blood loss anemia 07/29/2020  . AKI (acute kidney injury) (HCC) 07/11/2020  . Acute on chronic heart failure with preserved ejection fraction (HFpEF) (HCC)   . Permanent atrial fibrillation (HCC)   . Cellulitis 07/10/2020  . 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 Varicose veins of leg with swelling, right 02/08/2020  . 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 Greater trochanteric pain syndrome 11/02/2019  . Lower limb ulcer, ankle, left, limited to breakdown of skin (HCC) 11/02/2019  . H/O atrial flutter 07/15/2018  . Obstructive sleep apnea 10/23/2017  . Chronic diastolic CHF (congestive heart failure) (HCC) 09/24/2017  . Lymphedema 09/24/2017  . Chronic hyponatremia 08/12/2017  . Encounter for anticoagulation discussion and counseling 02/24/2017  . Bilateral leg edema 09/25/2015  .  RLS (restless legs syndrome) 08/23/2015  . C. difficile colitis 05/26/2015  . Blood loss anemia   . History of mechanical aortic valve replacement   . Anemia 04/02/2015  . GERD (gastroesophageal reflux disease)   . Bradycardia   . Depression   . Anxiety   . Hypertension   . Decreased hearing   . Coronary artery disease   . Aortic stenosis   . Warfarin anticoagulation   . Carotid bruit     Past Surgical History:  Procedure Laterality Date  . CARDIAC  CATHETERIZATION    . ESOPHAGOGASTRODUODENOSCOPY N/A 04/05/2015   Procedure: ESOPHAGOGASTRODUODENOSCOPY (EGD);  Surgeon: Scot Jun, MD;  Location: [facility] ENDOSCOPY;  Service: Endoscopy;  Laterality: N/A;  . ESOPHAGOGASTRODUODENOSCOPY N/A 04/17/2015   Procedure: ESOPHAGOGASTRODUODENOSCOPY (EGD);  Surgeon: Scot Jun, MD;  Location: [facility] ENDOSCOPY;  Service: Endoscopy;  Laterality: N/A;  . ESOPHAGOGASTRODUODENOSCOPY N/A 08/02/2015   Procedure: ESOPHAGOGASTRODUODENOSCOPY (EGD);  Surgeon: Wallace Cullens, MD;  Location: [facility] ENDOSCOPY;  Service: Endoscopy;  Laterality: N/A;  . HERNIA REPAIR    . JOINT REPLACEMENT    . TOTAL HIP ARTHROPLASTY    . VALVE REPLACEMENT  1/02   Aortic; echo 3/09 valve working well; echo 10/11 working well; put on Coumadin    Prior to Admission medications   Medication Sig Start Date End Date Taking? Authorizing Provider  Acetaminophen 500 MG capsule Take 2 capsules by mouth every 8 (eight) hours as needed.   Yes [provider]  allopurinol (ZYLOPRIM) 300 MG tablet Take 300 mg by mouth daily.  05/16/20  Yes [provider]  amoxicillin (AMOXIL) 500 MG capsule 4 CAPSULES PRIOR TO DENTAL PROCEDURES AS DIRECTED 06/27/20  Yes [provider]  Cetirizine HCl 10 MG CAPS Take 10 mg by mouth daily.   Yes [provider]  cloNIDine (CATAPRES) 0.1 MG tablet TAKE 1 TABLET BY MOUTH  TWICE DAILY Patient taking differently: Take 0.1 mg by mouth 2 (two) times daily.  01/05/20  Yes Antonieta Iba, MD  doxazosin (CARDURA) 8 MG tablet Take 8 mg by mouth 2 (two) times daily.  02/09/20  Yes [provider]  DULoxetine (CYMBALTA) 30 MG capsule Take 1 capsule (30 mg total) by mouth daily. 07/15/20  Yes Meredeth Ide, MD  finasteride (PROSCAR) 5 MG tablet Take 5 mg by mouth daily.     Yes [provider]  isosorbide mononitrate (IMDUR) 60 MG 24 hr tablet Take 1 tablet (60 mg total) by mouth 2 (two) times daily. 05/05/20  Yes Creig Hines, NP  metolazone (ZAROXOLYN) 2.5 MG tablet Take 1 tablet (2.5 mg total) by mouth 2 (two) times a week. 02/17/20 07/29/20 Yes Gollan, Tollie Pizza, MD  metoprolol succinate (TOPROL-XL) 25 MG 24 hr tablet Take 0.5 tablets (12.5 mg total) by mouth daily. 03/06/20  Yes Gollan, Tollie Pizza, MD  potassium chloride (KLOR-CON) 10 MEQ tablet TAKE 3 TABLETS (30 MEQ) BY MOUTH DAILY. TAKE AN ADDITIONAL 2TABS ON THE DAYS YOU TAKE METOLAZONE 06/08/20  Yes Gollan, Tollie Pizza, MD  rOPINIRole (REQUIP) 2 MG tablet TAKE 1 TABLET BY MOUTH 4  TIMES DAILY Patient taking differently: Take 2 mg by mouth in the morning, at noon, in the evening, and at bedtime. Take 1 tablet by mouth 4  times daily 09/14/18  Yes Millikan, Aundra Millet, NP  torsemide (DEMADEX) 20 MG tablet Take 2 tablets (40 mg total) by mouth daily. Take additional 40 mg for 3 pounds weight gain . 07/14/20  Yes Mauro Kaufmann  S, MD  traZODone (DESYREL) 50 MG tablet Take 50 mg by mouth at bedtime.   Yes [provider]  warfarin (COUMADIN) 5 MG tablet TAKE 1 TABLET BY MOUTH  DAILY OR AS DIRECTED BY THE ANTICOAGULATION CLINIC Patient taking differently: Take 2.5-5 mg by mouth daily. TAKE 1 TABLET BY MOUTH  DAILY OR AS DIRECTED BY THE ANTICOAGULATION CLINIC 06/19/20  Yes Antonieta Iba, MD    Allergies Sulfa antibiotics and Levofloxacin  Family History  Problem Relation Age of Onset  . Hypertension Mother   . Diabetes type II Mother   . Heart disease Mother   . Heart attack Mother   . Hypertension Father   . Heart disease Father   . Stroke Father   . Stroke Brother   . Stroke Brother   . Hypertension Other     Social History Social History   Tobacco Use  . Smoking status: Former Smoker    Types: Cigarettes    Quit date: 1979    Years since quitting: 42.7  . Smokeless tobacco: Never Used  Vaping Use  . Vaping Use: Never used  Substance Use Topics  . Alcohol use: Yes    Alcohol/week: 3.0 standard drinks    Types: 3 Glasses of wine  per week  . Drug use: No    Review of Systems  Review of Systems  Constitutional: Positive for malaise/fatigue. Negative for chills and fever.  HENT: Negative for sore throat.   Eyes: Negative for pain.  Respiratory: Positive for shortness of breath. Negative for cough and stridor.   Cardiovascular: Negative for chest pain.  Gastrointestinal: Negative for vomiting.  Skin: Negative for rash.  Neurological: Negative for seizures, loss of consciousness and headaches.  Psychiatric/Behavioral: Negative for suicidal ideas.  All other systems reviewed and are negative.     ____________________________________________   PHYSICAL EXAM:  VITAL SIGNS: ED Triage Vitals [07/29/20 0901]  Enc Vitals Group     BP      Pulse      Resp      Temp      Temp src      SpO2      Weight 212 lb 3.2 oz (96.3 kg)     Height 5\' 11"  (1.803 m)     Head Circumference      Peak Flow      Pain Score 0     Pain Loc      Pain Edu?      Excl. in GC?    Vitals:   07/29/20 1115 07/29/20 1215  BP: (!) 124/56 112/76  Pulse: 99 92  Resp: 18 14  Temp:    SpO2: 96% 95%   Physical Exam Vitals and nursing note reviewed. Exam conducted with a chaperone present.  Constitutional:      Appearance: Normal appearance. He is well-developed and normal weight.  HENT:     Head: Normocephalic and atraumatic.     Right Ear: External ear normal.     Left Ear: External ear normal.     Nose: Nose normal.  Eyes:     Conjunctiva/sclera: Conjunctivae normal.  Cardiovascular:     Rate and Rhythm: Normal rate and regular rhythm.     Heart sounds: No murmur heard.   Pulmonary:     Effort: Tachypnea and respiratory distress present.     Breath sounds: Normal breath sounds.  Abdominal:     Palpations: Abdomen is soft.     Tenderness: There is no abdominal  tenderness.  Genitourinary:    Rectum: Guaiac result positive. External hemorrhoid present.  Musculoskeletal:     Cervical back: Neck supple.     Right  lower leg: No edema.     Left lower leg: No edema.  Skin:    General: Skin is warm and dry.     Capillary Refill: Capillary refill takes less than 2 seconds.  Neurological:     General: No focal deficit present.     Mental Status: He is alert and oriented to person, place, and time.  Psychiatric:        Mood and Affect: Mood normal.      Bilateral lower extremities show some erythema but any streaking, edema, tenderness, fluctuance, or warmth.   ____________________________________________   LABS (all labs ordered are listed, but only abnormal results are displayed)  Labs Reviewed  BLOOD GAS, VENOUS - Abnormal; Notable for the following components:      Result Value   pH, Ven 7.44 (*)    Bicarbonate 29.9 (*)    Acid-Base Excess 5.3 (*)    All other components within normal limits  CBC WITH DIFFERENTIAL/PLATELET - Abnormal; Notable for the following components:   RBC 1.43 (*)    Hemoglobin 4.1 (*)    HCT 13.0 (*)    RDW 17.5 (*)    Neutro Abs 8.0 (*)    All other components within normal limits  COMPREHENSIVE METABOLIC PANEL - Abnormal; Notable for the following components:   Glucose, Bld 139 (*)    BUN 120 (*)    Creatinine, Ser 1.42 (*)    Calcium 8.2 (*)    Total Protein 4.8 (*)    Albumin 2.5 (*)    AST 67 (*)    Alkaline Phosphatase 160 (*)    GFR calc non Af Amer 44 (*)    GFR calc Af Amer 51 (*)    All other components within normal limits  LACTIC ACID, PLASMA - Abnormal; Notable for the following components:   Lactic Acid, Venous 4.3 (*)    All other components within normal limits  LACTIC ACID, PLASMA - Abnormal; Notable for the following components:   Lactic Acid, Venous 2.7 (*)    All other components within normal limits  MAGNESIUM - Abnormal; Notable for the following components:   Magnesium 2.6 (*)    All other components within normal limits  PROTIME-INR - Abnormal; Notable for the following components:   Prothrombin Time >90.0 (*)    INR >10.0 (*)     All other components within normal limits  URINALYSIS, COMPLETE (UACMP) WITH MICROSCOPIC - Abnormal; Notable for the following components:   Color, Urine YELLOW (*)    APPearance CLEAR (*)    All other components within normal limits  HEMOGLOBIN AND HEMATOCRIT, BLOOD - Abnormal; Notable for the following components:   Hemoglobin 6.3 (*)    HCT 18.4 (*)    All other components within normal limits  PROTIME-INR - Abnormal; Notable for the following components:   Prothrombin Time 26.7 (*)    INR 2.6 (*)    All other components within normal limits  APTT - Abnormal; Notable for the following components:   aPTT 55 (*)    All other components within normal limits  TROPONIN I (HIGH SENSITIVITY) - Abnormal; Notable for the following components:   Troponin I (High Sensitivity) 82 (*)    All other components within normal limits  TROPONIN I (HIGH SENSITIVITY) - Abnormal; Notable for the following components:  Troponin I (High Sensitivity) 80 (*)    All other components within normal limits  SARS CORONAVIRUS 2 BY RT PCR (HOSPITAL ORDER, PERFORMED IN [place] [facility])  CULTURE, BLOOD (SINGLE)  URINE CULTURE  FIBRIN DERIVATIVES D-DIMER ([facility] ONLY)  FIBRINOGEN  PLATELET COUNT  BRAIN NATRIURETIC PEPTIDE  APTT  DIC PANEL ([facility] ONLY)  DIC PANEL ([facility] ONLY)  DIC PANEL ([facility] ONLY)  DIC PANEL ([facility] ONLY)  HEMOGLOBIN AND HEMATOCRIT, BLOOD  HEMOGLOBIN AND HEMATOCRIT, BLOOD  HEMOGLOBIN AND HEMATOCRIT, BLOOD  HEMOGLOBIN AND HEMATOCRIT, BLOOD  PROTIME-INR  PROTIME-INR  TECHNOLOGIST SMEAR REVIEW  TYPE AND SCREEN  PREPARE RBC (CROSSMATCH)  MASSIVE TRANSFUSION PROTOCOL ORDER (BLOOD BANK NOTIFICATION)  PREPARE PLATELET PHERESIS  PREPARE FRESH FROZEN PLASMA   ____________________________________________  EKG  A. fib with a ventricular rate of 94, very poor overall tracing borderline uninterpretable in inferior and precordial leads with nonspecific ST changes and V4 and V5 and no  other clear evidence of acute ischemia. ____________________________________________  RADIOLOGY   Official radiology report(s): DG Chest Portable 1 View  Result Date: 07/29/2020 CLINICAL DATA:  Respiratory distress. EXAM: PORTABLE CHEST 1 VIEW COMPARISON:  07/11/2020 FINDINGS: Stable changes from prior cardiac surgery. The cardiac silhouette is normal in size. No mediastinal or hilar masses. Low lung volumes. Are thickened interstitial markings bilaterally unchanged compared to the prior study. No lung consolidation. No convincing pleural effusion and no pneumothorax. IMPRESSION: No acute cardiopulmonary disease. Electronically Signed   By: Amie Portland M.D.   On: 07/29/2020 09:37   CT Angio Abd/Pel W and/or Wo Contrast  Addendum Date: 07/29/2020   ADDENDUM REPORT: 07/29/2020 11:48 ADDENDUM: Additional reformats of the anatomic pelvis were performed resulting in reduced streak artifact and improved visualization of the sigmoid colon. There is no evidence of active GI bleeding in the previously obscured sections of the sigmoid colon. Visualization is now adequate to exclude active bleeding. Electronically Signed   By: Malachy Moan M.D.   On: 07/29/2020 11:48   Result Date: 07/29/2020 CLINICAL DATA:  Lethargic, hypoxia, GI bleeding EXAM: CTA ABDOMEN AND PELVIS WITHOUT AND WITH CONTRAST TECHNIQUE: Multidetector CT imaging of the abdomen and pelvis was performed using the standard protocol during bolus administration of intravenous contrast. Multiplanar reconstructed images and MIPs were obtained and reviewed to evaluate the vascular anatomy. CONTRAST:  80mL OMNIPAQUE IOHEXOL 350 MG/ML SOLN COMPARISON:  Remote prior CT abdomen/pelvis 07/22/2014 FINDINGS: VASCULAR Aorta: Scattered atherosclerotic calcifications throughout the abdominal aorta. No evidence of dissection or aneurysm. Celiac: Patent without evidence of aneurysm, dissection, vasculitis or significant stenosis. SMA: Patent without evidence of  aneurysm, dissection, vasculitis or significant stenosis. Renals: Solitary renal arteries bilaterally. Calcified plaque at the origins results in at least mild stenosis bilaterally. No evidence of aneurysm or dissection. IMA: Patent without evidence of aneurysm, dissection, vasculitis or significant stenosis. Inflow: Patent without evidence of aneurysm, dissection, vasculitis or significant stenosis. Proximal Outflow: Bilateral common femoral and visualized portions of the superficial and profunda femoral arteries are patent without evidence of aneurysm, dissection, vasculitis or significant stenosis. Veins: No focal venous abnormality. Review of the MIP images confirms the above findings. NON-VASCULAR Lower chest: Incompletely imaged aortic valve replacement. Cardiomegaly with bilateral atrial enlargement. No pericardial effusion. There is a large sliding hiatal hernia. Diffuse mild bronchial wall thickening. Small right and trace left layering pleural effusions. Hepatobiliary: Normal hepatic contour and morphology. No discrete hepatic lesion. Cholelithiasis is present within the gallbladder. No biliary ductal dilatation or evidence of cholecystitis. Pancreas: Unremarkable. No pancreatic ductal  dilatation or surrounding inflammatory changes. Spleen: Normal in size without focal abnormality. Adrenals/Urinary Tract: Normal adrenal glands. Large minimally complex cyst arising from the lower pole of the right kidney. Mild peripheral calcification. No significant change compared to 2015. This is almost certainly benign. No evidence of hydronephrosis, nephrolithiasis or enhancing renal mass. The ureters are unremarkable. Foley catheter present in the collapsed bladder. Stomach/Bowel: Colonic diverticular disease without CT evidence of active inflammation. Slightly limited evaluation of a portion of the sigmoid colon secondary to extensive streak artifact related to the patient's bilateral hip joint arthroplasties.  Lymphatic: No pathologic lymphadenopathy. Reproductive: Probable prostatomegaly. Limited evaluation secondary to extensive streak artifact. Other: No abdominal wall hernia or significant ascites. Musculoskeletal: No acute fracture or aggressive appearing lytic or blastic osseous lesion. Lower lumbar degenerative disc disease. IMPRESSION: VASCULAR 1.  Aortic Atherosclerosis (ICD10-170.0). 2. Cardiomegaly with biatrial enlargement. 3. Incompletely imaged aortic valve replacement. 4. Bilateral mild to moderate renal artery stenoses. NON-VASCULAR 1. No evidence of active GI bleeding. Of note, a portion of the sigmoid colon is suboptimally evaluated secondary to extensive streak artifact from bilateral hip joint prostheses. 2. Colonic diverticular disease without CT evidence of active inflammation. 3. Cholelithiasis. 4. Large hiatal hernia. 5. Large benign right lower pole renal cyst. 6. Prostatomegaly. 7. Lower lumbar degenerative disc disease. Electronically Signed: By: Malachy MoanHeath  McCullough M.D. On: 07/29/2020 11:32    ____________________________________________   PROCEDURES  Procedure(s) performed (including Critical Care):  .Critical Care Performed by: Gilles ChiquitoSmith, Chizaram Latino P, MD Authorized by: Gilles ChiquitoSmith, Shivangi Lutz P, MD   Critical care provider statement:    Critical care time (minutes):  135   Critical care time was exclusive of:  Separately billable procedures and treating other patients   Critical care was necessary to treat or prevent imminent or life-threatening deterioration of the following conditions:  Shock and circulatory failure   Critical care was time spent personally by me on the following activities:  Discussions with consultants, evaluation of patient's response to treatment, examination of patient, ordering and performing treatments and interventions, ordering and review of laboratory studies, ordering and review of radiographic studies, pulse oximetry, re-evaluation of patient's condition,  obtaining history from patient or surrogate and review of old charts .1-3 Lead EKG Interpretation Performed by: Gilles ChiquitoSmith, Jeryn Bertoni P, MD Authorized by: Gilles ChiquitoSmith, Brittanni Cariker P, MD     Interpretation: normal     ECG rate assessment: normal     Rhythm: atrial fibrillation     Ectopy: none     Conduction: normal       ____________________________________________   INITIAL IMPRESSION / ASSESSMENT AND PLAN / ED COURSE        Patient presents with us to history exam for assessment of shortness of breath.  Per report patient was hypoxic with EMS requiring a nonrebreather.  On arrival patient's SPO2 is noted to be 100% on 6 L nasal cannula and this was demonstrated to 2 L.  Exam as above remarkable for tachypnea with clear lungs and Hemoccult positive stool that is melanotic..  Abdomen is soft nontender and there are no obvious foci of infection or significant evidence of volume overload on exam.  Hemoglobin unremarkable at 4.1 down from 9 obtained on 8/23.  2 unit of emergency release immediately ordered.  Type and screen sent.  Protonix, antibiotics, and octreotide ordered.  Patient's INR did come back greater than 10.  Given concern for life-threatening hemorrhagic shock a few ordered.  Patient was initially very unstable with maps trending down and for a few  minutes in the 40s I did reach out to vascular surgery and interventional radiology stated that patient was not an interventional candidate given his elevated INR.  However on several reassessments patient's blood pressure was noted to be improving.  In addition recheck of his INR after FIBA down to 2.6.  Recheck of hemoglobin shows increased from 4.1-6.3 after 2 units.  Prior to receiving blood patient did receive 1 L of IV fluid and after receiving 2 units he did receive 1 unit of plasma.  I did discuss patient's presentation with on-call gastroenterologist Dr. Norma Fredrickson several times recommended stabilization and reversal of coagulopathy.   CT obtained  does not show evidence of active bleeding although there is a portion of the bowel that is suboptimally evaluated.  No other acute findings in the abdomen or pelvis although patient does have evidence of a hiatal hernia, diverticuli, prostamegaly, DJD, renal pulses, and atherosclerosis.  While EKG did show some ischemic nonspecific changes and patient's troponin is noted be elevated I believe this is most likely secondary to demand ischemia in the setting of hemorrhagic shock.  In addition patient's elevated lactic acid on arrival.  For likely secondary to hemorrhagic shock although patient was covered broadly with pressors antibiotics and blood cultures were obtained on arrival given initially was difficult to exclude septic shock.  I did reach out to on-call ICU attending to discuss appropriate level of care ICU versus stepdown.  Given improvement in hemoglobin, stabilization of vital signs, downtrending INR, patient stating he felt much better after discussion with intensivist I do believe stepdown is reasonable.  He will be monitored closely and will continue to monitor his hemoglobin and INR while in the ED.  We will plan to admit to medicine service for further evaluation management.  ____________________________________________   FINAL CLINICAL IMPRESSION(S) / ED DIAGNOSES  Final diagnoses:  Hemorrhagic shock (HCC)  Gastrointestinal hemorrhage, unspecified gastrointestinal hemorrhage type  Supratherapeutic INR  Low hemoglobin  Troponin I above reference range  Lactic acid acidosis  Atrial fibrillation, unspecified type (HCC)  H/O heart valve replacement with mechanical valve  At risk for injury associated with anticoagulation    Medications  0.9 %  sodium chloride infusion (Manually program via Guardrails IV Fluids) (has no administration in time range)  metroNIDAZOLE (FLAGYL) IVPB 500 mg (500 mg Intravenous New Bag/Given 07/29/20 1219)  vancomycin (VANCOCIN) IVPB 1000 mg/200 mL premix  (1,000 mg Intravenous New Bag/Given 07/29/20 1220)  octreotide (SANDOSTATIN) 500 mcg in sodium chloride 0.9 % 250 mL (2 mcg/mL) infusion (50 mcg/hr Intravenous New Bag/Given 07/29/20 1210)  ceFEPIme (MAXIPIME) 2 g in sodium chloride 0.9 % 100 mL IVPB (has no administration in time range)  rOPINIRole (REQUIP) tablet 2 mg (has no administration in time range)  vancomycin (VANCOCIN) IVPB 1000 mg/200 mL premix (has no administration in time range)  vancomycin (VANCOREADY) IVPB 1500 mg/300 mL (has no administration in time range)  lactated ringers bolus 1,000 mL (0 mLs Intravenous Stopped 07/29/20 1100)  pantoprazole (PROTONIX) injection 40 mg (40 mg Intravenous Given 07/29/20 1203)  ceFEPIme (MAXIPIME) 2 g in sodium chloride 0.9 % 100 mL IVPB (0 g Intravenous Stopped 07/29/20 1207)  iohexol (OMNIPAQUE) 350 MG/ML injection 80 mL (80 mLs Intravenous Contrast Given 07/29/20 1043)  prothrombin complex conc human (KCENTRA) IVPB 2,142 Units (0 Units Intravenous Stopped 07/29/20 1152)  phytonadione (VITAMIN K) 10 mg in dextrose 5 % 50 mL IVPB (0 mg Intravenous Stopped 07/29/20 1133)     ED Discharge Orders  None       Note:  This document was prepared using Dragon voice recognition software and may include unintentional dictation errors.   Gilles Chiquito, MD 07/29/20 [id number]

## 2020-07-29 NOTE — ED Notes (Signed)
GI provider at bedside ?

## 2020-07-29 NOTE — ED Notes (Signed)
Nasal SPO2 sensor replaced. Pt turned to L. (pillow under R hip.)

## 2020-07-29 NOTE — ED Notes (Signed)
Pt taken down to 2L [place]

## 2020-07-29 NOTE — ED Notes (Signed)
Pt on CPAP at pt request, side rails up x3, call bell within reach, lights dimmed for comfort

## 2020-07-29 NOTE — ED Triage Notes (Signed)
Pt arrives via EMS from [facility] after staff found him lethargic and without his cpap on- pt O2 sats per facility were in the low 80s- EMS got O2 sats up to 90% on NRB- pt BP 91/38 per EMS and cbg 120- pt alert to voice and answers questions and follows commands- pt remains on NRB

## 2020-07-29 NOTE — H&P (Signed)
History and Physical    [name] QJF:[id number] DOB: [birth year] DOA: 07/29/2020  PCP: Gracelyn Nurse, MD   Patient coming from: Skilled nursing facility  I have personally briefly reviewed patient's old medical records in [facility]  Chief Complaint: Shortness of breath   HPI: [name] is a 86 y.o. male with medical history significant for aortic stenosis s/p mechanical TAVR on Coumadin, chronic diastolic heart failure, hypertension, CAD, chronic venous stasis, GERD and history of C. difficile who was sent to the ER from the skilled nursing facility for evaluation of respiratory distress and altered mental status.  Patient was said to be lethargic and had room air pulse oximetry in the low 80s.  When EMS arrived patient was placed on nonrebreather mask with improvement in his pulse oximetry to 90%.  His blood pressure was 91/38 in the field by EMS and he was able to answer questions and follow simple commands. Patient complained of shortness of breath but denied having any cough, no chest pain, no abdominal pain, no dizziness or lightheaded. Labs in the ER revealed a hemoglobin of 4 and a hematocrit of 13.  INR was greater than 10 rectal exam revealed heme positive stools Patient received 2 units of packed RBC, vitamin K , Kcentra and 1 unit of FFP as well as 1 L of IV fluids  in the ER and repeat hemoglobin was up to 6.3g/dl from a baseline of 8.9H/TD Patient was also noted to have EKG changes with elevated troponin as well as elevated lactic acid all thought to be secondary to demand ischemia   ED Course: Patient is an 86 year old Caucasian male who was sent to the ER from the skilled nursing facility for evaluation of shortness of breath and mental status changes and was found to be anemic with hemoglobin of 4.1g/dl.  Rectal exam revealed heme positive stools and patient was initially hypotensive upon arrival to the ER.  He received 2 units of packed RBC, 1 unit  of FFP, vitamin K and Kcentra as well as  a liter of IV fluid with improvement in his blood pressure and improvement in his H&H as well.  He was coagulopathic with an INR of greater than 10 and repeat INR is about 2.  He will be admitted to the hospital for further evaluation.  Review of Systems: As per HPI otherwise 10 point review of systems negative.    Past Medical History:  Diagnosis Date  . Anxiety 10/11  . Aortic stenosis    a. s/p mechcanical AVR, 2002; b. 10/2018 Echo: Triv AI, mean grad .  . Bradycardia    chronic, no symptoms 07/2010  . C. difficile colitis   . Carotid bruit    dopplers in past, no abnormalities  . Chronic diastolic CHF (congestive heart failure) (HCC)    a. Echo 03/2015: EF 60-65%, no RWMA, GR1DD, mild BAE, mild to mod MR, mod TR, PASP 65 mmHg; b. 01/2017 Echo: EF 55-60%, NRWMA, grade 1 diastolic dysfunction.  Normal functioning prosthetic aortic valve.  Mean gradient 50 mmHg.  Sev TR. PASP ; c. 10/2018 Echo: EF 55-60%, Triv AI, mod dil LA, mod-sev TR, PASP 35-40, mild to mod red RV fxn.  . Coronary artery disease    a. mild, cath, 08/2010; b. medically managed  . Decreased hearing    Right ear  . Depression   . Gastric ulcer   . GERD (gastroesophageal reflux disease)   . Hypertension    BP higher  than usual 04/19/10; amlodipine increased by telephone  . Mod-Sev Tricuspid regurgitation    a. 10/2018 Echo: Mod-Sev TR, PASP 35-79mmHg.  Marland Kitchen RLS (restless legs syndrome) 08/23/2015  . S/P AVR    a. [employer]. mechanical 2002; b. echo 08/2010 EF 60%, trival AI, mild MR, AVR working well; c. on longterm warfarin tx  . SOB (shortness of breath) 10/11   08/2010,Episodes at 5 AM, eventually felt to be anxiety, after complete workup including catheterization, pt greatly improved with anxiety meds 11/11    Past Surgical History:  Procedure Laterality Date  . CARDIAC CATHETERIZATION    . ESOPHAGOGASTRODUODENOSCOPY N/A 04/05/2015   Procedure:  ESOPHAGOGASTRODUODENOSCOPY (EGD);  Surgeon: Scot Jun, MD;  Location: [facility] ENDOSCOPY;  Service: Endoscopy;  Laterality: N/A;  . ESOPHAGOGASTRODUODENOSCOPY N/A 04/17/2015   Procedure: ESOPHAGOGASTRODUODENOSCOPY (EGD);  Surgeon: Scot Jun, MD;  Location: [facility] ENDOSCOPY;  Service: Endoscopy;  Laterality: N/A;  . ESOPHAGOGASTRODUODENOSCOPY N/A 08/02/2015   Procedure: ESOPHAGOGASTRODUODENOSCOPY (EGD);  Surgeon: Wallace Cullens, MD;  Location: [facility] ENDOSCOPY;  Service: Endoscopy;  Laterality: N/A;  . HERNIA REPAIR    . JOINT REPLACEMENT    . TOTAL HIP ARTHROPLASTY    . VALVE REPLACEMENT  1/02   Aortic; echo 3/09 valve working well; echo 10/11 working well; put on Coumadin     reports that he quit smoking about 42 years ago. His smoking use included cigarettes. He has never used smokeless tobacco. He reports current alcohol use of about 3.0 standard drinks of alcohol per week. He reports that he does not use drugs.  Allergies  Allergen Reactions  . Sulfa Antibiotics Other (See Comments)    Reaction:  Unknown   . Levofloxacin Nausea Only    Family History  Problem Relation Age of Onset  . Hypertension Mother   . Diabetes type II Mother   . Heart disease Mother   . Heart attack Mother   . Hypertension Father   . Heart disease Father   . Stroke Father   . Stroke Brother   . Stroke Brother   . Hypertension Other      Prior to Admission medications   Medication Sig Start Date End Date Taking? Authorizing Provider  Acetaminophen 500 MG capsule Take 2 capsules by mouth every 8 (eight) hours as needed.   Yes [provider]  allopurinol (ZYLOPRIM) 300 MG tablet Take 300 mg by mouth daily.  05/16/20  Yes [provider]  amoxicillin (AMOXIL) 500 MG capsule 4 CAPSULES PRIOR TO DENTAL PROCEDURES AS DIRECTED 06/27/20  Yes [provider]  Cetirizine HCl 10 MG CAPS Take 10 mg by mouth daily.   Yes [provider]  cloNIDine (CATAPRES) 0.1 MG tablet TAKE 1  TABLET BY MOUTH  TWICE DAILY Patient taking differently: Take 0.1 mg by mouth 2 (two) times daily.  01/05/20  Yes Antonieta Iba, MD  doxazosin (CARDURA) 8 MG tablet Take 8 mg by mouth 2 (two) times daily.  02/09/20  Yes [provider]  DULoxetine (CYMBALTA) 30 MG capsule Take 1 capsule (30 mg total) by mouth daily. 07/15/20  Yes Meredeth Ide, MD  finasteride (PROSCAR) 5 MG tablet Take 5 mg by mouth daily.     Yes [provider]  isosorbide mononitrate (IMDUR) 60 MG 24 hr tablet Take 1 tablet (60 mg total) by mouth 2 (two) times daily. 05/05/20  Yes Creig Hines, NP  metolazone (ZAROXOLYN) 2.5 MG tablet Take 1 tablet (2.5 mg total) by mouth 2 (two)  times a week. 02/17/20 07/29/20 Yes Gollan, Tollie Pizzaimothy J, MD  metoprolol succinate (TOPROL-XL) 25 MG 24 hr tablet Take 0.5 tablets (12.5 mg total) by mouth daily. 03/06/20  Yes Gollan, Tollie Pizzaimothy J, MD  potassium chloride (KLOR-CON) 10 MEQ tablet TAKE 3 TABLETS (30 MEQ) BY MOUTH DAILY. TAKE AN ADDITIONAL 2TABS ON THE DAYS YOU TAKE METOLAZONE 06/08/20  Yes Gollan, Tollie Pizzaimothy J, MD  rOPINIRole (REQUIP) 2 MG tablet TAKE 1 TABLET BY MOUTH 4  TIMES DAILY Patient taking differently: Take 2 mg by mouth in the morning, at noon, in the evening, and at bedtime. Take 1 tablet by mouth 4  times daily 09/14/18  Yes Millikan, Aundra MilletMegan, NP  torsemide (DEMADEX) 20 MG tablet Take 2 tablets (40 mg total) by mouth daily. Take additional 40 mg for 3 pounds weight gain . 07/14/20  Yes Meredeth IdeLama, Gagan S, MD  traZODone (DESYREL) 50 MG tablet Take 50 mg by mouth at bedtime.   Yes [provider]  warfarin (COUMADIN) 5 MG tablet TAKE 1 TABLET BY MOUTH  DAILY OR AS DIRECTED BY THE ANTICOAGULATION CLINIC Patient taking differently: Take 2.5-5 mg by mouth daily. TAKE 1 TABLET BY MOUTH  DAILY OR AS DIRECTED BY THE ANTICOAGULATION CLINIC 06/19/20  Yes Antonieta IbaGollan, Timothy J, MD    Physical Exam: Vitals:   07/29/20 1400 07/29/20 1415 07/29/20 1445 07/29/20 1500  BP:  (!) 108/46 (!) 125/51 102/60 98/71  Pulse: 95 85  89  Resp: 18 16 16 17   Temp:      TempSrc:      SpO2: 98% 99%  98%  Weight:      Height:         Vitals:   07/29/20 1400 07/29/20 1415 07/29/20 1445 07/29/20 1500  BP: (!) 108/46 (!) 125/51 102/60 98/71  Pulse: 95 85  89  Resp: 18 16 16 17   Temp:      TempSrc:      SpO2: 98% 99%  98%  Weight:      Height:        Constitutional: NAD, alert and oriented x 2 Eyes: PERRL, lids and conjunctivae pallor ENMT: Mucous membranes are dry.  Neck: normal, supple, no masses, no thyromegaly Respiratory: clear to auscultation bilaterally, no wheezing, no crackles. Normal respiratory effort. No accessory muscle use.  Cardiovascular: Regular rate and rhythm, no murmurs / rubs / gallops. No extremity edema. 2+ pedal pulses. No carotid bruits.  Abdomen: no tenderness, no masses palpated. No hepatosplenomegaly. Bowel sounds positive.  Musculoskeletal: no clubbing / cyanosis. No joint deformity upper and lower extremities.  Skin: no rashes, lesions, ulcers. Hyperpigmented areas on his lower extremities Neurologic: No gross focal neurologic deficit. Generalized weakness Psychiatric: Normal mood and affect.   Labs on Admission: I have personally reviewed following labs and imaging studies  CBC: Recent Labs  Lab 07/29/20 0903 07/29/20 1138 07/29/20 1252  WBC 10.3  --   --   NEUTROABS 8.0*  --   --   HGB 4.1* 6.3* 5.6*  HCT 13.0* 18.4* 16.7*  MCV 90.9  --   --   PLT 257 222  --    Basic Metabolic Panel: Recent Labs  Lab 07/29/20 0903  NA 142  K 3.5  CL 103  CO2 27  GLUCOSE 139*  BUN 120*  CREATININE 1.42*  CALCIUM 8.2*  MG 2.6*   GFR: Estimated Creatinine Clearance: 44.2 mL/min (A) (by C-G formula based on SCr of 1.42 mg/dL (H)). Liver Function Tests: Recent Labs  Lab 07/29/20  0903  AST 67*  ALT 39  ALKPHOS 160*  BILITOT 0.8  PROT 4.8*  ALBUMIN 2.5*   No results for input(s): LIPASE, AMYLASE in the last 168  hours. No results for input(s): AMMONIA in the last 168 hours. Coagulation Profile: Recent Labs  Lab 07/29/20 0903 07/29/20 1138  INR >10.0* 2.6*   Cardiac Enzymes: No results for input(s): CKTOTAL, CKMB, CKMBINDEX, TROPONINI in the last 168 hours. BNP (last 3 results) No results for input(s): PROBNP in the last 8760 hours. HbA1C: No results for input(s): HGBA1C in the last 72 hours. CBG: No results for input(s): GLUCAP in the last 168 hours. Lipid Profile: No results for input(s): CHOL, HDL, LDLCALC, TRIG, CHOLHDL, LDLDIRECT in the last 72 hours. Thyroid Function Tests: No results for input(s): TSH, T4TOTAL, FREET4, T3FREE, THYROIDAB in the last 72 hours. Anemia Panel: No results for input(s): VITAMINB12, FOLATE, FERRITIN, TIBC, IRON, RETICCTPCT in the last 72 hours. Urine analysis:    Component Value Date/Time   COLORURINE YELLOW (A) 07/29/2020 0957   APPEARANCEUR CLEAR (A) 07/29/2020 0957   APPEARANCEUR Clear 03/20/2015 1146   LABSPEC 1.013 07/29/2020 0957   LABSPEC 1.033 03/20/2015 1146   PHURINE 5.0 07/29/2020 0957   GLUCOSEU NEGATIVE 07/29/2020 0957   GLUCOSEU Negative 03/20/2015 1146   HGBUR NEGATIVE 07/29/2020 0957   BILIRUBINUR NEGATIVE 07/29/2020 0957   BILIRUBINUR 1+ 03/20/2015 1146   KETONESUR NEGATIVE 07/29/2020 0957   PROTEINUR NEGATIVE 07/29/2020 0957   NITRITE NEGATIVE 07/29/2020 0957   LEUKOCYTESUR NEGATIVE 07/29/2020 0957   LEUKOCYTESUR Negative 03/20/2015 1146    Radiological Exams on Admission: DG Chest Portable 1 View  Result Date: 07/29/2020 CLINICAL DATA:  Respiratory distress. EXAM: PORTABLE CHEST 1 VIEW COMPARISON:  07/11/2020 FINDINGS: Stable changes from prior cardiac surgery. The cardiac silhouette is normal in size. No mediastinal or hilar masses. Low lung volumes. Are thickened interstitial markings bilaterally unchanged compared to the prior study. No lung consolidation. No convincing pleural effusion and no pneumothorax. IMPRESSION: No  acute cardiopulmonary disease. Electronically Signed   By: Amie Portland M.D.   On: 07/29/2020 09:37   CT Angio Abd/Pel W and/or Wo Contrast  Addendum Date: 07/29/2020   ADDENDUM REPORT: 07/29/2020 11:48 ADDENDUM: Additional reformats of the anatomic pelvis were performed resulting in reduced streak artifact and improved visualization of the sigmoid colon. There is no evidence of active GI bleeding in the previously obscured sections of the sigmoid colon. Visualization is now adequate to exclude active bleeding. Electronically Signed   By: Malachy Moan M.D.   On: 07/29/2020 11:48   Result Date: 07/29/2020 CLINICAL DATA:  Lethargic, hypoxia, GI bleeding EXAM: CTA ABDOMEN AND PELVIS WITHOUT AND WITH CONTRAST TECHNIQUE: Multidetector CT imaging of the abdomen and pelvis was performed using the standard protocol during bolus administration of intravenous contrast. Multiplanar reconstructed images and MIPs were obtained and reviewed to evaluate the vascular anatomy. CONTRAST:  43mL OMNIPAQUE IOHEXOL 350 MG/ML SOLN COMPARISON:  Remote prior CT abdomen/pelvis 07/22/2014 FINDINGS: VASCULAR Aorta: Scattered atherosclerotic calcifications throughout the abdominal aorta. No evidence of dissection or aneurysm. Celiac: Patent without evidence of aneurysm, dissection, vasculitis or significant stenosis. SMA: Patent without evidence of aneurysm, dissection, vasculitis or significant stenosis. Renals: Solitary renal arteries bilaterally. Calcified plaque at the origins results in at least mild stenosis bilaterally. No evidence of aneurysm or dissection. IMA: Patent without evidence of aneurysm, dissection, vasculitis or significant stenosis. Inflow: Patent without evidence of aneurysm, dissection, vasculitis or significant stenosis. Proximal Outflow: Bilateral common femoral and visualized portions  of the superficial and profunda femoral arteries are patent without evidence of aneurysm, dissection, vasculitis or  significant stenosis. Veins: No focal venous abnormality. Review of the MIP images confirms the above findings. NON-VASCULAR Lower chest: Incompletely imaged aortic valve replacement. Cardiomegaly with bilateral atrial enlargement. No pericardial effusion. There is a large sliding hiatal hernia. Diffuse mild bronchial wall thickening. Small right and trace left layering pleural effusions. Hepatobiliary: Normal hepatic contour and morphology. No discrete hepatic lesion. Cholelithiasis is present within the gallbladder. No biliary ductal dilatation or evidence of cholecystitis. Pancreas: Unremarkable. No pancreatic ductal dilatation or surrounding inflammatory changes. Spleen: Normal in size without focal abnormality. Adrenals/Urinary Tract: Normal adrenal glands. Large minimally complex cyst arising from the lower pole of the right kidney. Mild peripheral calcification. No significant change compared to 2015. This is almost certainly benign. No evidence of hydronephrosis, nephrolithiasis or enhancing renal mass. The ureters are unremarkable. Foley catheter present in the collapsed bladder. Stomach/Bowel: Colonic diverticular disease without CT evidence of active inflammation. Slightly limited evaluation of a portion of the sigmoid colon secondary to extensive streak artifact related to the patient's bilateral hip joint arthroplasties. Lymphatic: No pathologic lymphadenopathy. Reproductive: Probable prostatomegaly. Limited evaluation secondary to extensive streak artifact. Other: No abdominal wall hernia or significant ascites. Musculoskeletal: No acute fracture or aggressive appearing lytic or blastic osseous lesion. Lower lumbar degenerative disc disease. IMPRESSION: VASCULAR 1.  Aortic Atherosclerosis (ICD10-170.0). 2. Cardiomegaly with biatrial enlargement. 3. Incompletely imaged aortic valve replacement. 4. Bilateral mild to moderate renal artery stenoses. NON-VASCULAR 1. No evidence of active GI bleeding. Of  note, a portion of the sigmoid colon is suboptimally evaluated secondary to extensive streak artifact from bilateral hip joint prostheses. 2. Colonic diverticular disease without CT evidence of active inflammation. 3. Cholelithiasis. 4. Large hiatal hernia. 5. Large benign right lower pole renal cyst. 6. Prostatomegaly. 7. Lower lumbar degenerative disc disease. Electronically Signed: By: Malachy Moan M.D. On: 07/29/2020 11:32    EKG: Independently reviewed.  Atrial fibrillation with PVCs   Assessment/Plan Principal Problem:   Acute blood loss anemia Active Problems:   Depression   Aortic stenosis   History of mechanical aortic valve replacement   Chronic diastolic CHF (congestive heart failure) (HCC)   H/O atrial flutter   Lactic acidosis     Acute blood loss anemia Patient was sent to the ER from the skilled nursing facility for evaluation of respiratory distress and mental status changes and was found to have a hemoglobin of 4.1g/dl . Rectal exam revealed melena stools Patient was noted to be coagulopathic with an INR greater than 10 He received 2 units of packed RBC and 1 unit of FFP, vitamin K and Kcentra with improvement in his H&H to 6.3g/dl We will give 1 more unit of packed RBC Place patient on IV Protonix We will request GI consult Hold Coumadin    History of mechanical aortic valve replacement Patient was on Coumadin for same and presents with a supratherapeutic INR Hold Coumadin for now Check daily PT/INR   History of atrial flutter Rate controlled Hold anticoagulation due to acute blood loss anemia   Chronic diastolic CHF Stable and not in acute exacerbation Hold diuretic therapy for now    Lactic acidosis  Most likely secondary to hypoperfusion from acute blood loss anemia Initial lactic acid level was 4.3 and repeat was 2.7 following IV fluid and blood resuscitation    DVT prophylaxis: SCD Code Status: Full code Family Communication:  Greater than 50% of time was spent  discussing patient's condition and plan of care with him and his daughter at the bedside.  All questions and concerns have been addressed.  He verbalizes understanding and agrees with the plan. Disposition Plan: Back to previous home environment Consults called: GI    Coy Vandoren MD [facility]     07/29/2020, 3:52 PM

## 2020-07-29 NOTE — ED Notes (Signed)
Blood verified with Barbee Cough, RN

## 2020-07-29 NOTE — ED Notes (Signed)
Blood verified with Reina, RN 

## 2020-07-29 NOTE — ED Notes (Signed)
nonrebreather mask removed. Pt remains on [place] at 5L.

## 2020-07-29 NOTE — ED Notes (Signed)
Pt to CT

## 2020-07-29 NOTE — Progress Notes (Addendum)
Pharmacy Antibiotic Note  [name] is a 86 y.o. male admitted on 07/29/2020 with an intra-abdominal infection. Patient has a history of C diff. Patient received one time doses of vanc, cefepime, and metronidazole for sepsis in the ED. Pharmacy has been consulted for Zosyn dosing.   Plan: Will give IV Zosyn 3.375gm EI every 8 hours   Height: 5\' 11"  (180.3 cm) Weight: 96.3 kg (212 lb 3.2 oz) IBW/kg (Calculated) : 75.3  Temp (24hrs), Avg:97.6 F (36.4 C), Min:97.5 F (36.4 C), Max:97.7 F (36.5 C)  Recent Labs  Lab 07/29/20 0903  WBC 10.3  CREATININE 1.42*  LATICACIDVEN 4.3*    Estimated Creatinine Clearance: 44.2 mL/min (A) (by C-G formula based on SCr of 1.42 mg/dL (H)).    Allergies  Allergen Reactions  . Sulfa Antibiotics Other (See Comments)    Reaction:  Unknown   . Levofloxacin Nausea Only    Antimicrobials this admission: 9/4 Cefepime, Vancomycin, Metronidazole x1 9/4 Zosyn >>  Dose adjustments this admission: N/A  Microbiology results: 9/4 BCx: pending 9/4 UCx: pending  Thank you for allowing pharmacy to be a part of this patient's care.  11/4, PharmD Pharmacy Resident  07/29/2020 12:06 PM

## 2020-07-30 ENCOUNTER — Inpatient Hospital Stay: Payer: Medicare Other

## 2020-07-30 ENCOUNTER — Inpatient Hospital Stay: Payer: Medicare Other | Admitting: Certified Registered"

## 2020-07-30 ENCOUNTER — Encounter: Payer: Self-pay | Admitting: Family Medicine

## 2020-07-30 ENCOUNTER — Encounter
Admission: EM | Disposition: A | Discharge status: Skilled Nursing Facility | Payer: Self-pay | Source: Home / Self Care | Attending: Internal Medicine

## 2020-07-30 DIAGNOSIS — R578 Other shock: Secondary | ICD-10-CM | POA: Diagnosis present

## 2020-07-30 HISTORY — PX: ESOPHAGOGASTRODUODENOSCOPY: SHX5428

## 2020-07-30 LAB — BASIC METABOLIC PANEL
Anion gap: 9 (ref 5–15)
BUN: 99 mg/dL — ABNORMAL HIGH (ref 8–23)
CO2: 28 mmol/L (ref 22–32)
Calcium: 7.9 mg/dL — ABNORMAL LOW (ref 8.9–10.3)
Chloride: 108 mmol/L (ref 98–111)
Creatinine, Ser: 1.22 mg/dL (ref 0.61–1.24)
GFR calc Af Amer: >60 mL/min (ref >60)
GFR calc non Af Amer: 53 mL/min — ABNORMAL LOW (ref >60)
Glucose, Bld: 135 mg/dL — ABNORMAL HIGH (ref 70–99)
Potassium: 3 mmol/L — ABNORMAL LOW (ref 3.5–5.1)
Sodium: 145 mmol/L (ref 135–145)

## 2020-07-30 LAB — BPAM PLATELET PHERESIS
Blood Product Expiration Date: 202109042359
Unit Type and Rh: 6200

## 2020-07-30 LAB — MAGNESIUM: Magnesium: 2.6 mg/dL — ABNORMAL HIGH (ref 1.7–2.4)

## 2020-07-30 LAB — CBC
HCT: 16.6 % — ABNORMAL LOW (ref 39.0–52.0)
HCT: 23.4 % — ABNORMAL LOW (ref 39.0–52.0)
Hemoglobin: 5.6 g/dL — ABNORMAL LOW (ref 13.0–17.0)
Hemoglobin: 7.8 g/dL — ABNORMAL LOW (ref 13.0–17.0)
MCH: 29.5 pg (ref 26.0–34.0)
MCH: 29.5 pg (ref 26.0–34.0)
MCHC: 33.7 g/dL (ref 30.0–36.0)
MCHC: 33.3 g/dL (ref 30.0–36.0)
MCV: 87.4 fL (ref 80.0–100.0)
MCV: 88.6 fL (ref 80.0–100.0)
Platelets: 239 10*3/uL (ref 150–400)
Platelets: 200 10*3/uL (ref 150–400)
RBC: 1.9 MIL/uL — ABNORMAL LOW (ref 4.22–5.81)
RBC: 2.64 MIL/uL — ABNORMAL LOW (ref 4.22–5.81)
RDW: 15.9 % — ABNORMAL HIGH (ref 11.5–15.5)
RDW: 16.3 % — ABNORMAL HIGH (ref 11.5–15.5)
WBC: 12.1 10*3/uL — ABNORMAL HIGH (ref 4.0–10.5)
WBC: 12.6 10*3/uL — ABNORMAL HIGH (ref 4.0–10.5)
nRBC: 0 % (ref 0.0–0.2)
nRBC: 0 % (ref 0.0–0.2)

## 2020-07-30 LAB — PROTIME-INR
INR: 1.3 — ABNORMAL HIGH (ref 0.8–1.2)
INR: 1.4 — ABNORMAL HIGH (ref 0.8–1.2)
INR: 1.4 — ABNORMAL HIGH (ref 0.8–1.2)
Prothrombin Time: 15.4 seconds — ABNORMAL HIGH (ref 11.4–15.2)
Prothrombin Time: 16.2 seconds — ABNORMAL HIGH (ref 11.4–15.2)
Prothrombin Time: 16.2 seconds — ABNORMAL HIGH (ref 11.4–15.2)

## 2020-07-30 LAB — BLOOD GAS, VENOUS
Acid-Base Excess: 5.3 mmol/L — ABNORMAL HIGH (ref 0.0–2.0)
Bicarbonate: 29.9 mmol/L — ABNORMAL HIGH (ref 20.0–28.0)
O2 Saturation: 34.7 %
Patient temperature: 37
pCO2, Ven: 44 mmHg (ref 44.0–60.0)
pH, Ven: 7.44 — ABNORMAL HIGH (ref 7.250–7.430)

## 2020-07-30 LAB — BPAM FFP
Blood Product Expiration Date: 202109092359
Blood Product Expiration Date: 202109092359
Blood Product Expiration Date: 202109092359
Blood Product Expiration Date: 202109092359
ISSUE DATE / TIME: 202109041056
ISSUE DATE / TIME: 202109041056
ISSUE DATE / TIME: 202109041056
Unit Type and Rh: 5100
Unit Type and Rh: 5100
Unit Type and Rh: 5100
Unit Type and Rh: 6200

## 2020-07-30 LAB — PREPARE RBC (CROSSMATCH)

## 2020-07-30 LAB — PREPARE FRESH FROZEN PLASMA: Unit division: 0

## 2020-07-30 LAB — HEMOGLOBIN AND HEMATOCRIT, BLOOD
HCT: 22.2 % — ABNORMAL LOW (ref 39.0–52.0)
Hemoglobin: 7.3 g/dL — ABNORMAL LOW (ref 13.0–17.0)

## 2020-07-30 LAB — PREPARE PLATELET PHERESIS: Unit division: 0

## 2020-07-30 SURGERY — EGD (ESOPHAGOGASTRODUODENOSCOPY)
Anesthesia: General

## 2020-07-30 MED ORDER — VASOPRESSIN 20 UNIT/ML IV SOLN
INTRAVENOUS | Status: DC | PRN
  Administered 2020-07-30: 2 [IU] via INTRAVENOUS

## 2020-07-30 MED ORDER — POTASSIUM CHLORIDE 10 MEQ/100ML IV SOLN
10.0000 meq | INTRAVENOUS | Status: AC
  Administered 2020-07-30 (×3): 10 meq via INTRAVENOUS
  Filled 2020-07-30 (×3): qty 100

## 2020-07-30 MED ORDER — LIDOCAINE HCL (CARDIAC) PF 100 MG/5ML IV SOSY
PREFILLED_SYRINGE | INTRAVENOUS | Status: DC | PRN
  Administered 2020-07-30: 100 mg via INTRAVENOUS

## 2020-07-30 MED ORDER — FUROSEMIDE 10 MG/ML IJ SOLN
40.0000 mg | Freq: Once | INTRAMUSCULAR | Status: AC
  Administered 2020-07-30: 40 mg via INTRAVENOUS
  Filled 2020-07-30: qty 4

## 2020-07-30 MED ORDER — SODIUM CHLORIDE 0.9% IV SOLUTION
Freq: Once | INTRAVENOUS | Status: AC
  Filled 2020-07-30: qty 250

## 2020-07-30 MED ORDER — EPHEDRINE SULFATE 50 MG/ML IJ SOLN
INTRAMUSCULAR | Status: DC | PRN
  Administered 2020-07-30: 10 mg via INTRAVENOUS

## 2020-07-30 MED ORDER — SODIUM CHLORIDE 0.9 % IV SOLN: INTRAVENOUS | Status: DC

## 2020-07-30 MED ORDER — ONDANSETRON HCL 4 MG/2ML IJ SOLN: 4.0000 mg | Freq: Once | INTRAMUSCULAR | Status: DC | PRN

## 2020-07-30 MED ORDER — POTASSIUM CHLORIDE CRYS ER 20 MEQ PO TBCR
40.0000 meq | EXTENDED_RELEASE_TABLET | Freq: Once | ORAL | Status: AC
  Administered 2020-07-30: 40 meq via ORAL
  Filled 2020-07-30: qty 2

## 2020-07-30 MED ORDER — PHENYLEPHRINE HCL (PRESSORS) 10 MG/ML IV SOLN
INTRAVENOUS | Status: DC | PRN
  Administered 2020-07-30: 200 ug via INTRAVENOUS
  Administered 2020-07-30: 100 ug via INTRAVENOUS
  Administered 2020-07-30: 200 ug via INTRAVENOUS

## 2020-07-30 MED ORDER — FENTANYL CITRATE (PF) 100 MCG/2ML IJ SOLN: 25.0000 ug | INTRAMUSCULAR | Status: DC | PRN

## 2020-07-30 MED ORDER — PROPOFOL 10 MG/ML IV BOLUS
INTRAVENOUS | Status: DC | PRN
  Administered 2020-07-30: 20 mg via INTRAVENOUS
  Administered 2020-07-30: 30 mg via INTRAVENOUS

## 2020-07-30 MED ORDER — WARFARIN - PHARMACIST DOSING INPATIENT: Freq: Every day | Status: DC

## 2020-07-30 MED ORDER — BISACODYL 5 MG PO TBEC
10.0000 mg | DELAYED_RELEASE_TABLET | Freq: Once | ORAL | Status: AC
  Administered 2020-07-30: 10 mg via ORAL
  Filled 2020-07-30: qty 2

## 2020-07-30 MED ORDER — HEPARIN (PORCINE) 25000 UT/250ML-% IV SOLN
1350.0000 [IU]/h | INTRAVENOUS | Status: DC
  Administered 2020-07-30: 1100 [IU]/h via INTRAVENOUS
  Administered 2020-07-31 – 2020-08-02 (×3): 1300 [IU]/h via INTRAVENOUS
  Administered 2020-08-04 – 2020-08-05 (×2): 1350 [IU]/h via INTRAVENOUS
  Filled 2020-07-30 (×7): qty 250

## 2020-07-30 MED ORDER — PROPOFOL 500 MG/50ML IV EMUL
INTRAVENOUS | Status: DC | PRN
  Administered 2020-07-30: 75 ug/kg/min via INTRAVENOUS

## 2020-07-30 MED ORDER — POTASSIUM CHLORIDE CRYS ER 20 MEQ PO TBCR: 40.0000 meq | EXTENDED_RELEASE_TABLET | Freq: Once | ORAL | Status: DC

## 2020-07-30 MED ORDER — POLYETHYLENE GLYCOL 3350 17 GM/SCOOP PO POWD
1.0000 | Freq: Once | ORAL | Status: AC
  Administered 2020-07-30: 255 g via ORAL
  Filled 2020-07-30 (×2): qty 255

## 2020-07-30 MED ORDER — WARFARIN SODIUM 2.5 MG PO TABS
2.5000 mg | ORAL_TABLET | Freq: Once | ORAL | Status: AC
  Administered 2020-07-30: 2.5 mg via ORAL
  Filled 2020-07-30: qty 1

## 2020-07-30 MED ORDER — WARFARIN SODIUM 2.5 MG PO TABS
2.5000 mg | ORAL_TABLET | Freq: Once | ORAL | Status: DC
  Filled 2020-07-30: qty 1

## 2020-07-30 NOTE — ED Notes (Signed)
Pt used call back and asked for assistance in "getting dressed" because his "son is coming to pick me up." Pt reminded he is in the hospital and going to be admitted. Bed alarm set. Provider made aware via text page about potassium and hemoglobin. Pt talking in full sentences and O2 sat remains 97-100% on RA.

## 2020-07-30 NOTE — ED Notes (Addendum)
NP Ouma made aware of increased confusion, CT ordered and called

## 2020-07-30 NOTE — Transfer of Care (Signed)
Immediate Anesthesia Transfer of Care Note  Patient: [name]  Procedure(s) Performed: ESOPHAGOGASTRODUODENOSCOPY (EGD) (N/A )  Patient Location: PACU  Anesthesia Type:General  Level of Consciousness: awake and confused  Airway & Oxygen Therapy: Patient Spontanous Breathing and Patient connected to face mask oxygen  Post-op Assessment: Report given to RN and Post -op Vital signs reviewed and stable  Post vital signs: Reviewed and stable  Last Vitals:  Vitals Value Taken Time  BP 143/67 07/30/20 1618  Temp 36.2 C 07/30/20 1618  Pulse 74 07/30/20 1618  Resp 16 07/30/20 1618  SpO2 95 % 07/30/20 1618    Last Pain:  Vitals:   07/30/20 1618  TempSrc:   PainSc: 0-No pain         Complications: No complications documented.

## 2020-07-30 NOTE — ED Notes (Signed)
Pt taken for endo 

## 2020-07-30 NOTE — Progress Notes (Addendum)
ANTICOAGULATION CONSULT NOTE  Pharmacy Consult for Warfarin Indication: aortic stenosis s/p St Jude mechanical AVR (2002)  Allergies  Allergen Reactions  . Sulfa Antibiotics Other (See Comments)    Reaction:  Unknown   . Levofloxacin Nausea Only    Patient Measurements: Height: 5\' 11"  (180.3 cm) Weight: 96.3 kg (212 lb 3.2 oz) IBW/kg (Calculated) : 75.3  Vital Signs: Temp: 97.8 F (36.6 C) (09/05 1202) Temp Source: Oral (09/05 1202) BP: 124/59 (09/05 1445) Pulse Rate: 87 (09/05 1400)  Labs: Recent Labs    07/29/20 0903 07/29/20 0903 07/29/20 1138 07/29/20 1245 07/29/20 1729 07/29/20 1729 07/30/20 0051 07/30/20 0506 07/30/20 0632 07/30/20 1433  HGB 4.1*   < > 6.3*   < > 5.7*   < >  --  5.6*  --  7.3*  HCT 13.0*   < > 18.4*   < > 17.2*  --   --  16.6*  --  22.2*  PLT 257  --  222  --   --   --   --  239  --   --   APTT  --   --  55*  --   --   --   --   --   --   --   LABPROT >90.0*   < > 26.7*   < >  --   --  15.4* 16.2* 16.2*  --   INR >10.0*   < > 2.6*   < >  --   --  1.3* 1.4* 1.4*  --   CREATININE 1.42*  --   --   --   --   --   --  1.22  --   --   TROPONINIHS 82*  --  80*  --   --   --   --   --   --   --    < > = values in this interval not displayed.    Estimated Creatinine Clearance: 51.5 mL/min (by C-G formula based on SCr of 1.22 mg/dL).   Medical History: Past Medical History:  Diagnosis Date  . Anxiety 10/11  . Aortic stenosis    a. s/p mechcanical AVR, 2002; b. 10/2018 Echo: Triv AI, mean grad 11/2018.  . Bradycardia    chronic, no symptoms 07/2010  . C. difficile colitis   . Carotid bruit    dopplers in past, no abnormalities  . Chronic diastolic CHF (congestive heart failure) (HCC)    a. Echo 03/2015: EF 60-65%, no RWMA, GR1DD, mild BAE, mild to mod MR, mod TR, PASP 65 mmHg; b. 01/2017 Echo: EF 55-60%, NRWMA, grade 1 diastolic dysfunction.  Normal functioning prosthetic aortic valve.  Mean gradient 50 mmHg.  Sev TR. PASP 02/2017; c. 10/2018 Echo:  EF 55-60%, Triv AI, mod dil LA, mod-sev TR, PASP 35-40, mild to mod red RV fxn.  . Coronary artery disease    a. mild, cath, 08/2010; b. medically managed  . Decreased hearing    Right ear  . Depression   . Gastric ulcer   . GERD (gastroesophageal reflux disease)   . Hypertension    BP higher than usual 04/19/10; amlodipine increased by telephone  . Mod-Sev Tricuspid regurgitation    a. 10/2018 Echo: Mod-Sev TR, PASP 35-35mmHg.  43m RLS (restless legs syndrome) 08/23/2015  . S/P AVR    a. St. Jude. mechanical 2002; b. echo 08/2010 EF 60%, trival AI, mild MR, AVR working well; c. on longterm warfarin tx  . SOB (shortness  of breath) 10/11   08/2010,Episodes at 5 AM, eventually felt to be anxiety, after complete workup including catheterization, pt greatly improved with anxiety meds 11/11    Medications:  PTA warfarin 5mg  daily  Assessment: Pharmacy has been consulted for warfarin dosing and monitoring in an 86yo male with a PMH significant for aortic stenosis s/p St. Jude mechanical AVR in 2002 (on Coumadin), chronic diastolic HF, HTN, CAD, chronic venous stasis. Upon admission patient Hgb 4, Hct 14, and INR >10; rectal exam revealed heme positive stools. Patient received 2 units pRBC, 10mg  IV vitamin K x1, Kcentra, and 1 unit FFP. INR improved to 1.4. Patient is scheduled for EGD today (9/5)  Goal of Therapy:  INR 2.5-3.5 (per Coumadin clinic) Monitor platelets by anticoagulation protocol: Yes   Plan:  -Will give warfarin 2.5mg  tonight in light of patient coming in with supratherapeutic INR on his home regimen -Monitor daily INR and CBC   , PharmD Pharmacy Resident  07/30/2020 3:01 PM

## 2020-07-30 NOTE — ED Notes (Signed)
NT was walking by and heard bed alarm going off. Pt was trying to get something off under his back and readjust in bed. This NT and Theodoro Grist, Medic pulled pt up in bed and readjusted him. [name] bed alarm turned on and son present at bedside.

## 2020-07-30 NOTE — Progress Notes (Addendum)
ANTICOAGULATION CONSULT NOTE  Pharmacy Consult for Heparin bridge to Warfarin Indication: aortic stenosis s/p St Jude mechanical AVR (2002)  Allergies  Allergen Reactions   Sulfa Antibiotics Other (See Comments)    Reaction:  Unknown    Levofloxacin Nausea Only    Patient Measurements: Height: 5\' 11"  (180.3 cm) Weight: 96.3 kg (212 lb 3.2 oz) IBW/kg (Calculated) : 75.3  Heparin DW: 94.8 kg  Vital Signs: Temp: 97.7 F (36.5 C) (09/05 1639) Temp Source: Oral (09/05 1202) BP: 113/56 (09/05 1709) Pulse Rate: 87 (09/05 1709)  Labs: Recent Labs    07/29/20 0903 07/29/20 0903 07/29/20 1138 07/29/20 1245 07/29/20 1729 07/29/20 1729 07/30/20 0051 07/30/20 0506 07/30/20 0632 07/30/20 1433  HGB 4.1*   < > 6.3*   < > 5.7*   < >  --  5.6*  --  7.3*  HCT 13.0*   < > 18.4*   < > 17.2*  --   --  16.6*  --  22.2*  PLT 257  --  222  --   --   --   --  239  --   --   APTT  --   --  55*  --   --   --   --   --   --   --   LABPROT >90.0*   < > 26.7*   < >  --   --  15.4* 16.2* 16.2*  --   INR >10.0*   < > 2.6*   < >  --   --  1.3* 1.4* 1.4*  --   CREATININE 1.42*  --   --   --   --   --   --  1.22  --   --   TROPONINIHS 82*  --  80*  --   --   --   --   --   --   --    < > = values in this interval not displayed.    Estimated Creatinine Clearance: 51.5 mL/min (by C-G formula based on SCr of 1.22 mg/dL).   Medical History: Past Medical History:  Diagnosis Date   Anxiety 10/11   Aortic stenosis    a. s/p mechcanical AVR, 2002; b. 10/2018 Echo: Triv AI, mean grad 11/2018.   Bradycardia    chronic, no symptoms 07/2010   C. difficile colitis    Carotid bruit    dopplers in past, no abnormalities   Chronic diastolic CHF (congestive heart failure) (HCC)    a. Echo 03/2015: EF 60-65%, no RWMA, GR1DD, mild BAE, mild to mod MR, mod TR, PASP 65 mmHg; b. 01/2017 Echo: EF 55-60%, NRWMA, grade 1 diastolic dysfunction.  Normal functioning prosthetic aortic valve.  Mean gradient 50  mmHg.  Sev TR. PASP 02/2017; c. 10/2018 Echo: EF 55-60%, Triv AI, mod dil LA, mod-sev TR, PASP 35-40, mild to mod red RV fxn.   Coronary artery disease    a. mild, cath, 08/2010; b. medically managed   Decreased hearing    Right ear   Depression    Gastric ulcer    GERD (gastroesophageal reflux disease)    Hypertension    BP higher than usual 04/19/10; amlodipine increased by telephone   Mod-Sev Tricuspid regurgitation    a. 10/2018 Echo: Mod-Sev TR, PASP 35-20mmHg.   RLS (restless legs syndrome) 08/23/2015   S/P AVR    a. St. Jude. mechanical 2002; b. echo 08/2010 EF 60%, trival AI, mild MR, AVR working well; c.  on longterm warfarin tx   SOB (shortness of breath) 10/11   08/2010,Episodes at 5 AM, eventually felt to be anxiety, after complete workup including catheterization, pt greatly improved with anxiety meds 11/11    Medications:  PTA warfarin 5mg  daily  Assessment: Pharmacy has been consulted for warfarin dosing and monitoring in an 86yo male with a PMH significant for aortic stenosis s/p St. Jude mechanical AVR in 2002 (on Coumadin), chronic diastolic HF, HTN, CAD, chronic venous stasis. Upon admission patient Hgb 4, Hct 14, and INR >10; rectal exam revealed heme positive stools. Patient received 2 units pRBC, 10mg  IV vitamin K x1, Kcentra, and 1 unit FFP. INR improved to 1.4. Patient is scheduled for EGD today (9/5)  Hgb has improved 4.1 >> 7.3 since admission. Continue to monitor closely for signs of re-bleeding with resumption of anticoagulation. EGD today did not identify source of bleeding. Colonoscopy is planned for 07/31/20.   Warfarin:  Date INR Plan  07/29/20 > 10 Hold warfarin  07/30/20 1.4 Warfarin 2.5 mg    Heparin:  Date HL Plan  07/30/20 N/A Start at 1100u/hr    Goal of Therapy:  INR 2.5-3.5 (per Coumadin clinic) Monitor platelets by anticoagulation protocol: Yes   Plan:   Warfarin -Will give warfarin 2.5mg  tonight in light of patient coming in with  supratherapeutic INR on his home regimen -Monitor daily INR  Heparin --Start heparin at 1100 units/hr, no bolus given bleeding concerns --HL 8 hours after initiation of infusion --Daily CBC per protocol  Will need to continue bridge for at least 5 days and until INR > 2.5 for two days   09/29/20 07/30/2020 5:30 PM

## 2020-07-30 NOTE — ED Notes (Signed)
Pt back from CT

## 2020-07-30 NOTE — Op Note (Signed)
[facility] Patient Name: Rodricus Candelaria Procedure Date: 07/30/2020 3:43 PM MRN: [id number] Account #: [id number] Date of Birth: 19-Aug-1931 Admit Type: Inpatient Age: 86 Room: [facility] ENDO ROOM 4 Gender: Male Note Status: Finalized Procedure:             Upper GI endoscopy Indications:           Acute post hemorrhagic anemia, Melena Providers:             Boykin Nearing. Norma Fredrickson MD, MD Referring MD:          No Local Md, MD (Referring MD) Medicines:             Propofol per Anesthesia Complications:         No immediate complications. Procedure:             Pre-Anesthesia Assessment:                        - The risks and benefits of the procedure and the                         sedation options and risks were discussed with the                         patient. All questions were answered and informed                         consent was obtained.                        - Patient identification and proposed procedure were                         verified prior to the procedure by the nurse. The                         procedure was verified in the procedure room.                        - ASA Grade Assessment: III - A patient with severe                         systemic disease.                        - After reviewing the risks and benefits, the patient                         was deemed in satisfactory condition to undergo the                         procedure.                        After obtaining informed consent, the endoscope was                         passed under direct vision. Throughout the procedure,                         the patient's  blood pressure, pulse, and oxygen                         saturations were monitored continuously. The Endoscope                         was introduced through the mouth, and advanced to the                         third part of duodenum. The upper GI endoscopy was                         accomplished without  difficulty. The patient tolerated                         the procedure well. Findings:      Moderate tortuosity of the mid to distal esophagus was noted compatible       with a diagnosis of Presbyesophagus.      A large hiatal hernia was present.      The examined duodenum was normal.      The exam was otherwise without abnormality.      There is no endoscopic evidence of bleeding, erythema or inflammatory       changes suggestive of gastritis, inflammation, ulceration, varices,       angioectasia or Dieulafoy lesions in the entire examined stomach. Impression:            - Large hiatal hernia.                        - Normal examined duodenum.                        - The examination was otherwise normal.                        - No specimens collected. Recommendation:        - Return patient to hospital ward for ongoing care.                        - Perform a colonoscopy tomorrow.                        - The findings and recommendations were discussed with                         the patient and their family. Procedure Code(s):     --- Professional ---                        [phone], Esophagogastroduodenoscopy, flexible,                         transoral; diagnostic, including collection of                         specimen(s) by brushing or washing, when performed                         (separate procedure) Diagnosis Code(s):     --- Professional ---  K92.1, Melena (includes Hematochezia)                        D62, Acute posthemorrhagic anemia                        K44.9, Diaphragmatic hernia without obstruction or                         gangrene CPT copyright 2019 [employer]. All rights reserved. The codes documented in this report are preliminary and upon coder review may  be revised to meet current compliance requirements. Stanton Kidney MD, MD 07/30/2020 4:18:59 PM This report has been signed electronically. Number of Addenda:  0 Note Initiated On: 07/30/2020 3:43 PM Estimated Blood Loss:  Estimated blood loss: none.      [facility] [place]

## 2020-07-30 NOTE — Progress Notes (Signed)
[facility] PROGRESS NOTE    [name]  VPX:[id number] DOB: 11-Nov-1931 DOA: 07/29/2020 PCP: Gracelyn Nurse, MD      Brief Narrative:  [name] is a 86 y.o. M with dCHF, HTN, CAD, AS s/p [employer] Jude AVR 2002, tricuspid regurg, chronic anemia, Cdiff remote, and PUD who presented with confusion from SNF.  Evidently, patient noted to be altered, out of breath and hypoxic at SNF acutely on day of admission.  In the ER, SpO2 < 90%, tahcypneic, Hgb 4 g/dL, INR >46, and rectal exam positive for blood.  Given PRBCs x2 units, PCCs, vitamin K and FFP.  Started on PPI, and GI consulted.         Assessment & Plan:  Acute hypoxic respiratory failure and hemorrhagic shock Patient presented with tachypnea and SpO2 < 90% Also lactic acid >4 at admission, no signs of infection.  Appears to be hemorrhagic shock due to anemia Sepsis ruled out   Acute GI bleed Suspect peptic ulcer -NPO -IVF -Continue PPI -Stop octreotide -Consult GI, appreciate cares   Acute blood loss anemia on chronic anemia Acquired thrombophilia with supratherapeutic INR S/p PCC, phytonadione and FFP at admission. INR now <1.8. -Resume warfarin post EGD if no contraindications -Bridge with heparin gtt -Consult GI -Trend Hgb -Transfusion threshold 8 g/dL given CHF and cardiogenic shock at presentation  History of aortic stenosis status post insulin injection aortic valve higher dose Discussed anticoagulation with Cardiology, will need to resume anticoagulation as soon as possible.  If he has no bleeding today and EGD unremarkable, I would recommend resuming warfarin tonight with heparin bridge.   Chronic diastolic CHF Hypertension Coronary artery disease, secondary prevention Appears euvolemic at present, caution fluid overload with blood products BP normal -Continue metoprolol -Hold clonidine, doxazosin, Imdur  Hypokalemia -Supplement K -Check mag  Acute metabolic  encephalopathy Due to anemia, ongoing blood loss, delirium and hospitalized 86 year old.  No focal deficits to suggest stroke.  CT head unremarkable.  Restless leg syndrome -Continue ropinirole  Gout -Hold allopurinol for now   Depression -Hold duloxetine  BPH  -Hold finasteride             Disposition: Status is: Inpatient  Remains inpatient appropriate because:Inpatient level of care appropriate due to severity of illness   Dispo: The patient is from: SNF              Anticipated d/c is to: SNF              Anticipated d/c date is: 3 days              Patient currently is not medically stable to d/c.       Patient admitted with supratherapeutic INR, GI bleeding, severe acute blood loss anemia.  He will need endoscopy this afternoon, transfusions, close monitoring of hemoglobin.  If his endoscopies are all normal, and he has no recurrent bleeding with restarting anticoagulation, his hemoglobin stabilizes, we can likely get him back to skilled nursing facility for rehabilitation within 2 to 3 days.       MDM: The below labs and imaging reports were reviewed and summarized above.  Medication management as above.  Anticoagulation managed.     DVT prophylaxis: SCDs Start: 07/29/20 1352  Code Status: Full code Family Communication: Daughter by phone    Consultants:   GI  Procedures:   EGD planned  Antimicrobials:   Cefepime, flagyl, Zosyn, vancomycin x1 9/4   Culture data:   9/4  blood culture x1 -- ngtd  9/4 urine culture -- ngtd           Subjective: Patient is feeling well.  No fever, no cough, no dysuria.  No more melena today.  No hematochezia.  No vomiting.  Objective: Vitals:   07/30/20 1330 07/30/20 1400 07/30/20 1430 07/30/20 1445  BP:    (!) 124/59  Pulse: 95 87    Resp: 20 12 14    Temp:      TempSrc:      SpO2: 97% 99%    Weight:      Height:        Intake/Output Summary (Last 24 hours) at 07/30/2020  1527 Last data filed at 07/30/2020 1418 Gross per 24 hour  Intake 2492.47 ml  Output 2450 ml  Net 42.47 ml   Filed Weights   07/29/20 0901  Weight: 96.3 kg    Examination: General appearance:  adult male, alert and in no acute distress.   HEENT: Anicteric, conjunctiva pink, lids and lashes normal. No nasal deformity, discharge, epistaxis.  Lips moist.  Dentures in place.  Oropharynx moist, no oral lesions, hearing normal. Skin: Warm and dry.  no jaundice.  No suspicious rashes or lesions. Cardiac: RRR, nl S1-S2, no murmurs appreciated.  Capillary refill is brisk  JVP normal.  No LE edema.  Radial pulses 2+ and symmetric. Respiratory: Normal respiratory rate and rhythm.  CTAB without rales or wheezes. Abdomen: Abdomen soft.  No TTP or guarding. No ascites, distension, hepatosplenomegaly.   MSK: No deformities or effusions. Neuro: Awake and alert.  EOMI, moves all extremities. Speech fluent.    Psych: Sensorium intact and responding to questions, attention normal. Affect normal.  Judgment and insight appear mildly impaired.    Data Reviewed: I have personally reviewed following labs and imaging studies:  CBC: Recent Labs  Lab 07/29/20 0903 07/29/20 0903 07/29/20 1138 07/29/20 1252 07/29/20 1729 07/30/20 0506 07/30/20 1433  WBC 10.3  --   --   --   --  12.1*  --   NEUTROABS 8.0*  --   --   --   --   --   --   HGB 4.1*   < > 6.3* 5.6* 5.7* 5.6* 7.3*  HCT 13.0*   < > 18.4* 16.7* 17.2* 16.6* 22.2*  MCV 90.9  --   --   --   --  87.4  --   PLT 257  --  222  --   --  239  --    < > = values in this interval not displayed.   Basic Metabolic Panel: Recent Labs  Lab 07/29/20 0903 07/30/20 0506  NA 142 145  K 3.5 3.0*  CL 103 108  CO2 27 28  GLUCOSE 139* 135*  BUN 120* 99*  CREATININE 1.42* 1.22  CALCIUM 8.2* 7.9*  MG 2.6* 2.6*   GFR: Estimated Creatinine Clearance: 51.5 mL/min (by C-G formula based on SCr of 1.22 mg/dL). Liver Function Tests: Recent Labs  Lab  07/29/20 0903  AST 67*  ALT 39  ALKPHOS 160*  BILITOT 0.8  PROT 4.8*  ALBUMIN 2.5*   No results for input(s): LIPASE, AMYLASE in the last 168 hours. No results for input(s): AMMONIA in the last 168 hours. Coagulation Profile: Recent Labs  Lab 07/29/20 1138 07/29/20 1245 07/30/20 0051 07/30/20 0506 07/30/20 0632  INR 2.6* 1.5* 1.3* 1.4* 1.4*   Cardiac Enzymes: No results for input(s): CKTOTAL, CKMB, CKMBINDEX, TROPONINI in the last 168 hours.  BNP (last 3 results) No results for input(s): PROBNP in the last 8760 hours. HbA1C: No results for input(s): HGBA1C in the last 72 hours. CBG: No results for input(s): GLUCAP in the last 168 hours. Lipid Profile: No results for input(s): CHOL, HDL, LDLCALC, TRIG, CHOLHDL, LDLDIRECT in the last 72 hours. Thyroid Function Tests: No results for input(s): TSH, T4TOTAL, FREET4, T3FREE, THYROIDAB in the last 72 hours. Anemia Panel: No results for input(s): VITAMINB12, FOLATE, FERRITIN, TIBC, IRON, RETICCTPCT in the last 72 hours. Urine analysis:    Component Value Date/Time   COLORURINE YELLOW (A) 07/29/2020 0957   APPEARANCEUR CLEAR (A) 07/29/2020 0957   APPEARANCEUR Clear 03/20/2015 1146   LABSPEC 1.013 07/29/2020 0957   LABSPEC 1.033 03/20/2015 1146   PHURINE 5.0 07/29/2020 0957   GLUCOSEU NEGATIVE 07/29/2020 0957   GLUCOSEU Negative 03/20/2015 1146   HGBUR NEGATIVE 07/29/2020 0957   BILIRUBINUR NEGATIVE 07/29/2020 0957   BILIRUBINUR 1+ 03/20/2015 1146   KETONESUR NEGATIVE 07/29/2020 0957   PROTEINUR NEGATIVE 07/29/2020 0957   NITRITE NEGATIVE 07/29/2020 0957   LEUKOCYTESUR NEGATIVE 07/29/2020 0957   LEUKOCYTESUR Negative 03/20/2015 1146   Sepsis Labs: @LABRCNTIP (procalcitonin:4,lacticacidven:4)  ) Recent Results (from the past 240 hour(s))  SARS Coronavirus 2 by RT PCR (hospital order, performed in [facility] hospital lab) Nasopharyngeal Nasopharyngeal Swab     Status: None   Collection Time: 07/29/20  9:54 AM    Specimen: Nasopharyngeal Swab  Result Value Ref Range Status   SARS Coronavirus 2 NEGATIVE NEGATIVE Final    Comment: (NOTE) SARS-CoV-2 target nucleic acids are NOT DETECTED.  The SARS-CoV-2 RNA is generally detectable in upper and lower respiratory specimens during the acute phase of infection. The lowest concentration of SARS-CoV-2 viral copies this assay can detect is 250 copies / mL. A negative result does not preclude SARS-CoV-2 infection and should not be used as the sole basis for treatment or other patient management decisions.  A negative result may occur with improper specimen collection / handling, submission of specimen other than nasopharyngeal swab, presence of viral mutation(s) within the areas targeted by this assay, and inadequate number of viral copies (<250 copies / mL). A negative result must be combined with clinical observations, patient history, and epidemiological information.  Fact Sheet for Patients:   BoilerBrush.com.cyhttps://www.fda.gov/media/136312/download  Fact Sheet for Healthcare Providers: https://pope.com/https://www.fda.gov/media/136313/download  This test is not yet approved or  cleared by the [place] FDA and has been authorized for detection and/or diagnosis of SARS-CoV-2 by FDA under an Emergency Use Authorization (EUA).  This EUA will remain in effect (meaning this test can be used) for the duration of the COVID-19 declaration under Section 564(b)(1) of the Act, 21 U.S.C. section 360bbb-3(b)(1), unless the authorization is terminated or revoked sooner.  Performed at [facility], [address] [place], [place] [address]   Blood culture (routine single)     Status: None (Preliminary result)   Collection Time: 07/29/20  9:54 AM   Specimen: BLOOD  Result Value Ref Range Status   Specimen Description BLOOD RIGHT HAND  Final   Special Requests   Final    BOTTLES DRAWN AEROBIC AND ANAEROBIC Blood Culture adequate volume   Culture   Final    NO GROWTH < 24  HOURS Performed at [facility], [address] [place], [place] [address]    Report Status PENDING  Incomplete         Radiology Studies: CT HEAD WO CONTRAST  Result Date: 07/30/2020 CLINICAL DATA:  Acute  neuro deficit with stroke suspected EXAM: CT HEAD WITHOUT CONTRAST TECHNIQUE: Contiguous axial images were obtained from the base of the skull through the vertex without intravenous contrast. COMPARISON:  07/19/2012 FINDINGS: Brain: No evidence of acute infarction, hemorrhage, hydrocephalus, extra-axial collection or mass lesion/mass effect. Generalized cerebral volume loss with expected progression from 2013. Normal white matter appearance. Vascular: No hyperdense vessel or unexpected calcification. Skull: Normal. Negative for fracture or focal lesion. Sinuses/Orbits: No acute finding. IMPRESSION: Aging brain without acute or reversible finding. Electronically Signed   By: Marnee Spring M.D.   On: 07/30/2020 07:32   DG Chest Portable 1 View  Result Date: 07/29/2020 CLINICAL DATA:  Respiratory distress. EXAM: PORTABLE CHEST 1 VIEW COMPARISON:  07/11/2020 FINDINGS: Stable changes from prior cardiac surgery. The cardiac silhouette is normal in size. No mediastinal or hilar masses. Low lung volumes. Are thickened interstitial markings bilaterally unchanged compared to the prior study. No lung consolidation. No convincing pleural effusion and no pneumothorax. IMPRESSION: No acute cardiopulmonary disease. Electronically Signed   By: Amie Portland M.D.   On: 07/29/2020 09:37   CT Angio Abd/Pel W and/or Wo Contrast  Addendum Date: 07/29/2020   ADDENDUM REPORT: 07/29/2020 11:48 ADDENDUM: Additional reformats of the anatomic pelvis were performed resulting in reduced streak artifact and improved visualization of the sigmoid colon. There is no evidence of active GI bleeding in the previously obscured sections of the sigmoid colon. Visualization is now adequate to exclude active bleeding.  Electronically Signed   By: Malachy Moan M.D.   On: 07/29/2020 11:48   Result Date: 07/29/2020 CLINICAL DATA:  Lethargic, hypoxia, GI bleeding EXAM: CTA ABDOMEN AND PELVIS WITHOUT AND WITH CONTRAST TECHNIQUE: Multidetector CT imaging of the abdomen and pelvis was performed using the standard protocol during bolus administration of intravenous contrast. Multiplanar reconstructed images and MIPs were obtained and reviewed to evaluate the vascular anatomy. CONTRAST:  80mL OMNIPAQUE IOHEXOL 350 MG/ML SOLN COMPARISON:  Remote prior CT abdomen/pelvis 07/22/2014 FINDINGS: VASCULAR Aorta: Scattered atherosclerotic calcifications throughout the abdominal aorta. No evidence of dissection or aneurysm. Celiac: Patent without evidence of aneurysm, dissection, vasculitis or significant stenosis. SMA: Patent without evidence of aneurysm, dissection, vasculitis or significant stenosis. Renals: Solitary renal arteries bilaterally. Calcified plaque at the origins results in at least mild stenosis bilaterally. No evidence of aneurysm or dissection. IMA: Patent without evidence of aneurysm, dissection, vasculitis or significant stenosis. Inflow: Patent without evidence of aneurysm, dissection, vasculitis or significant stenosis. Proximal Outflow: Bilateral common femoral and visualized portions of the superficial and profunda femoral arteries are patent without evidence of aneurysm, dissection, vasculitis or significant stenosis. Veins: No focal venous abnormality. Review of the MIP images confirms the above findings. NON-VASCULAR Lower chest: Incompletely imaged aortic valve replacement. Cardiomegaly with bilateral atrial enlargement. No pericardial effusion. There is a large sliding hiatal hernia. Diffuse mild bronchial wall thickening. Small right and trace left layering pleural effusions. Hepatobiliary: Normal hepatic contour and morphology. No discrete hepatic lesion. Cholelithiasis is present within the gallbladder. No  biliary ductal dilatation or evidence of cholecystitis. Pancreas: Unremarkable. No pancreatic ductal dilatation or surrounding inflammatory changes. Spleen: Normal in size without focal abnormality. Adrenals/Urinary Tract: Normal adrenal glands. Large minimally complex cyst arising from the lower pole of the right kidney. Mild peripheral calcification. No significant change compared to 2015. This is almost certainly benign. No evidence of hydronephrosis, nephrolithiasis or enhancing renal mass. The ureters are unremarkable. Foley catheter present in the collapsed bladder. Stomach/Bowel: Colonic diverticular disease without CT evidence of active inflammation.  Slightly limited evaluation of a portion of the sigmoid colon secondary to extensive streak artifact related to the patient's bilateral hip joint arthroplasties. Lymphatic: No pathologic lymphadenopathy. Reproductive: Probable prostatomegaly. Limited evaluation secondary to extensive streak artifact. Other: No abdominal wall hernia or significant ascites. Musculoskeletal: No acute fracture or aggressive appearing lytic or blastic osseous lesion. Lower lumbar degenerative disc disease. IMPRESSION: VASCULAR 1.  Aortic Atherosclerosis (ICD10-170.0). 2. Cardiomegaly with biatrial enlargement. 3. Incompletely imaged aortic valve replacement. 4. Bilateral mild to moderate renal artery stenoses. NON-VASCULAR 1. No evidence of active GI bleeding. Of note, a portion of the sigmoid colon is suboptimally evaluated secondary to extensive streak artifact from bilateral hip joint prostheses. 2. Colonic diverticular disease without CT evidence of active inflammation. 3. Cholelithiasis. 4. Large hiatal hernia. 5. Large benign right lower pole renal cyst. 6. Prostatomegaly. 7. Lower lumbar degenerative disc disease. Electronically Signed: By: Malachy Moan M.D. On: 07/29/2020 11:32        Scheduled Meds: . metoprolol succinate  12.5 mg Oral Daily  . pantoprazole  (PROTONIX) IV  40 mg Intravenous Q12H  . rOPINIRole  2 mg Oral QID   Continuous Infusions: . sodium chloride 75 mL/hr at 07/30/20 0928  . sodium chloride       LOS: 1 day    Time spent: 35 minutes    Alberteen Sam, MD [facility] 07/30/2020, 3:27 PM     Please page though AMION or Epic secure chat:  For [employer], Higher education careers adviser

## 2020-07-30 NOTE — H&P (View-Only) (Signed)
GI Inpatient Follow-up Note  Subjective:  Patient seen in follow-up for hemorrhagic shock 2/2 acute blood loss anemia 2/2 GI bleed. Patient became confused overnight and STAT CT head was negative for any acute abnormalities. Hemoglobin 5.6 this morning and INR 1.4. He is receiving transfusion this morning. He remains on Protonix and octreotide was discontinued. There has been no overt GI hemorrhage. Patient denies any abdominal pain, nausea, vomiting, dysphagia, fevers, chills. He is able to contribute much more to the history today as he has his hearing aids in.  Scheduled Inpatient Medications:  . metoprolol succinate  12.5 mg Oral Daily  . pantoprazole (PROTONIX) IV  40 mg Intravenous Q12H  . rOPINIRole  2 mg Oral QID    Continuous Inpatient Infusions:   . sodium chloride 75 mL/hr at 07/30/20 0928  . potassium chloride 10 mEq (07/30/20 0932)    PRN Inpatient Medications:  ondansetron **OR** ondansetron (ZOFRAN) IV  Review of Systems: Constitutional: Weight is stable.  Eyes: No changes in vision. ENT: No oral lesions, sore throat.  GI: see HPI.  Heme/Lymph: No easy bruising.  CV: No chest pain.  GU: No hematuria.  Integumentary: No rashes.  Neuro: No headaches.  Psych: No depression/anxiety.  Endocrine: No heat/cold intolerance.  Allergic/Immunologic: No urticaria.  Resp: No cough, SOB.  Musculoskeletal: No joint swelling.    Physical Examination: BP (!) 113/52   Pulse 91   Temp 98.4 F (36.9 C) (Oral)   Resp 16   Ht 5\' 11"  (1.803 m)   Wt 96.3 kg   SpO2 95%   BMI 29.60 kg/m  Gen: NAD, alert and oriented x 4 HEENT: PEERLA, EOMI, Neck: supple, no JVD or thyromegaly Chest: CTA bilaterally, no wheezes, crackles, or other adventitious sounds CV: RRR, no m/g/c/r Abd: soft, NT, ND, +BS in all four quadrants; no HSM, guarding, ridigity, or rebound tenderness Ext: no edema, well perfused with 2+ pulses, Skin: no rash or lesions noted Lymph: no LAD  Data: Lab  Results  Component Value Date   WBC 12.1 (H) 07/30/2020   HGB 5.6 (L) 07/30/2020   HCT 16.6 (L) 07/30/2020   MCV 87.4 07/30/2020   PLT 239 07/30/2020   Recent Labs  Lab 07/29/20 1252 07/29/20 1729 07/30/20 0506  HGB 5.6* 5.7* 5.6*   Lab Results  Component Value Date   NA 145 07/30/2020   K 3.0 (L) 07/30/2020   CL 108 07/30/2020   CO2 28 07/30/2020   BUN 99 (H) 07/30/2020   CREATININE 1.22 07/30/2020   Lab Results  Component Value Date   ALT 39 07/29/2020   AST 67 (H) 07/29/2020   ALKPHOS 160 (H) 07/29/2020   BILITOT 0.8 07/29/2020   Recent Labs  Lab 07/29/20 1138 07/29/20 1245 07/30/20 0632  APTT 55*  --   --   INR 2.6*   < > 1.4*   < > = values in this interval not displayed.   Assessment/Plan:  86 y/o Caucasian male with a PMH of aortic stenosis s/p mechanical TAVR on Coumadin, chronic diastolic CHF, HTN, CAD, chronic venous stasis, GERD, and Hx of c diff presented to the ED yesterday via EMS from SNF for respiratory distress and lethargy  1. Profound anemia - hemoglobin 4.1 on admission s/p transfusion with hemoglobin 5.6 this morning with transfusion ongoing, heme positive stool with no overt gastrointestinal blood loss. CTA negative for any active GI bleeding.  2. Supratherapeutic INR - INR >10, PT >90 on admission improved to 1.4 this morning s/p  FFP+plt+Kcentra+VitK+pRBCs  3. Hx of gastric ulcer - found on endoscopy 2016  4. Hemorrhagic shock/Sepsis - receiving IVF and broad spectrum antibiotics  5. Atrial fibrillation    Recommendations:  -EGD today. Procedure discussed with patient and family (Son Leeandre Nordling and daughter [name]). The patient and family verbalize understanding of  the nature of the planned procedure, indications, risks, alternatives and potential complications including but not limited to bleeding, infection, perforation, damage to internal organs and possible oversedation/side effects from anesthesia. The patient, Dodd Schmid and [name] agree and give consent to proceed.  Please refer to procedure notes for findings, recommendations and patient disposition/instructions.    Recommendations:  As above. Further recommendations to follow.    Jacob Moores, PA-C [facility] [phone] (Cell)  [name], M.D. ABIM Diplomate in Gastroenterology [facility]

## 2020-07-30 NOTE — ED Notes (Signed)
Pt handed fresh water per request.

## 2020-07-30 NOTE — Progress Notes (Addendum)
GI Inpatient Follow-up Note  Subjective:  Patient seen in follow-up for hemorrhagic shock 2/2 acute blood loss anemia 2/2 GI bleed. Patient became confused overnight and STAT CT head was negative for any acute abnormalities. Hemoglobin 5.6 this morning and INR 1.4. He is receiving transfusion this morning. He remains on Protonix and octreotide was discontinued. There has been no overt GI hemorrhage. Patient denies any abdominal pain, nausea, vomiting, dysphagia, fevers, chills. He is able to contribute much more to the history today as he has his hearing aids in.  Scheduled Inpatient Medications:  . metoprolol succinate  12.5 mg Oral Daily  . pantoprazole (PROTONIX) IV  40 mg Intravenous Q12H  . rOPINIRole  2 mg Oral QID    Continuous Inpatient Infusions:   . sodium chloride 75 mL/hr at 07/30/20 0928  . potassium chloride 10 mEq (07/30/20 0932)    PRN Inpatient Medications:  ondansetron **OR** ondansetron (ZOFRAN) IV  Review of Systems: Constitutional: Weight is stable.  Eyes: No changes in vision. ENT: No oral lesions, sore throat.  GI: see HPI.  Heme/Lymph: No easy bruising.  CV: No chest pain.  GU: No hematuria.  Integumentary: No rashes.  Neuro: No headaches.  Psych: No depression/anxiety.  Endocrine: No heat/cold intolerance.  Allergic/Immunologic: No urticaria.  Resp: No cough, SOB.  Musculoskeletal: No joint swelling.    Physical Examination: BP (!) 113/52   Pulse 91   Temp 98.4 F (36.9 C) (Oral)   Resp 16   Ht 5\' 11"  (1.803 m)   Wt 96.3 kg   SpO2 95%   BMI 29.60 kg/m  Gen: NAD, alert and oriented x 4 HEENT: PEERLA, EOMI, Neck: supple, no JVD or thyromegaly Chest: CTA bilaterally, no wheezes, crackles, or other adventitious sounds CV: RRR, no m/g/c/r Abd: soft, NT, ND, +BS in all four quadrants; no HSM, guarding, ridigity, or rebound tenderness Ext: no edema, well perfused with 2+ pulses, Skin: no rash or lesions noted Lymph: no LAD  Data: Lab  Results  Component Value Date   WBC 12.1 (H) 07/30/2020   HGB 5.6 (L) 07/30/2020   HCT 16.6 (L) 07/30/2020   MCV 87.4 07/30/2020   PLT 239 07/30/2020   Recent Labs  Lab 07/29/20 1252 07/29/20 1729 07/30/20 0506  HGB 5.6* 5.7* 5.6*   Lab Results  Component Value Date   NA 145 07/30/2020   K 3.0 (L) 07/30/2020   CL 108 07/30/2020   CO2 28 07/30/2020   BUN 99 (H) 07/30/2020   CREATININE 1.22 07/30/2020   Lab Results  Component Value Date   ALT 39 07/29/2020   AST 67 (H) 07/29/2020   ALKPHOS 160 (H) 07/29/2020   BILITOT 0.8 07/29/2020   Recent Labs  Lab 07/29/20 1138 07/29/20 1245 07/30/20 0632  APTT 55*  --   --   INR 2.6*   < > 1.4*   < > = values in this interval not displayed.   Assessment/Plan:  86 y/o Caucasian male with a PMH of aortic stenosis s/p mechanical TAVR on Coumadin, chronic diastolic CHF, HTN, CAD, chronic venous stasis, GERD, and Hx of c diff presented to the ED yesterday via EMS from SNF for respiratory distress and lethargy  1. Profound anemia - hemoglobin 4.1 on admission s/p transfusion with hemoglobin 5.6 this morning with transfusion ongoing, heme positive stool with no overt gastrointestinal blood loss. CTA negative for any active GI bleeding.  2. Supratherapeutic INR - INR >10, PT >90 on admission improved to 1.4 this morning s/p  FFP+plt+Kcentra+VitK+pRBCs  3. Hx of gastric ulcer - found on endoscopy 2016  4. Hemorrhagic shock/Sepsis - receiving IVF and broad spectrum antibiotics  5. Atrial fibrillation    Recommendations:  -EGD today. Procedure discussed with patient and family (Son [name] and daughter [name]). The patient and family verbalize understanding of  the nature of the planned procedure, indications, risks, alternatives and potential complications including but not limited to bleeding, infection, perforation, damage to internal organs and possible oversedation/side effects from anesthesia. The patient, [name]  [name] and [name] agree and give consent to proceed.  Please refer to procedure notes for findings, recommendations and patient disposition/instructions.    Recommendations:  As above. Further recommendations to follow.    [name] Croley, PA-C [facility] [phone] (Cell)  [name], M.D. ABIM Diplomate in Gastroenterology [facility]     

## 2020-07-30 NOTE — Anesthesia Procedure Notes (Signed)
Procedure Name: General with mask airway Performed by: Fletcher-Harrison, Ayianna Darnold, CRNA Pre-anesthesia Checklist: Patient identified, Emergency Drugs available, Suction available and Patient being monitored Patient Re-evaluated:Patient Re-evaluated prior to induction Oxygen Delivery Method: Simple face mask Induction Type: IV induction Placement Confirmation: positive ETCO2 and CO2 detector Dental Injury: Teeth and Oropharynx as per pre-operative assessment        

## 2020-07-30 NOTE — ED Notes (Signed)
Pt intermittently placed on CPAP as he rests on and off through the night. Currently 97-100% on RA. Denies needs or concerns. Ice water provided per request. Pt resting comfortably with regular and unlabored breathing. Denies needs or concerns. AOx4. Side rail up x2. Call bell within reach, bed lowered.

## 2020-07-30 NOTE — Anesthesia Preprocedure Evaluation (Addendum)
Anesthesia Evaluation  Patient identified by MRN, date of birth, ID band Patient awake    Reviewed: Allergy & Precautions, H&P , NPO status , Patient's Chart, lab work & pertinent test results, reviewed documented beta blocker date and time   History of Anesthesia Complications Negative for: history of anesthetic complications  Airway Mallampati: III  TM Distance: >3 FB Neck ROM: full    Dental  (+) Poor Dentition, Missing   Pulmonary shortness of breath, neg COPD, neg recent URI, former smoker,    Pulmonary exam normal breath sounds clear to auscultation       Cardiovascular Exercise Tolerance: Good hypertension, On Medications (-) angina+ CAD and +CHF  (-) Past MI, (-) Cardiac Stents and (-) CABG Normal cardiovascular exam(-) dysrhythmias + Valvular Problems/Murmurs (s/p AVR) AS and AI  Rhythm:regular Rate:Normal     Neuro/Psych PSYCHIATRIC DISORDERS (depression) negative neurological ROS     GI/Hepatic Neg liver ROS, PUD, GERD  ,  Endo/Other  negative endocrine ROS  Renal/GU Renal disease  negative genitourinary   Musculoskeletal  (+) Arthritis , Osteoarthritis,    Abdominal   Peds negative pediatric ROS (+)  Hematology  (+) Blood dyscrasia, anemia ,   Anesthesia Other Findings Past Medical History: 10/11: Anxiety No date: Aortic stenosis     Comment:  a. s/p mechcanical AVR, 2002; b. 10/2018 Echo: Triv AI,               mean grad . No date: Bradycardia     Comment:  chronic, no symptoms 07/2010 No date: C. difficile colitis No date: Carotid bruit     Comment:  dopplers in past, no abnormalities No date: Chronic diastolic CHF (congestive heart failure) (HCC)     Comment:  a. Echo 03/2015: EF 60-65%, no RWMA, GR1DD, mild BAE,               mild to mod MR, mod TR, PASP 65 mmHg; b. 01/2017 Echo: EF               55-60%, NRWMA, grade 1 diastolic dysfunction.  Normal               functioning prosthetic  aortic valve.  Mean gradient 50               mmHg.  Sev TR. PASP ; c. 10/2018 Echo: EF 55-60%,               Triv AI, mod dil LA, mod-sev TR, PASP 35-40, mild to mod               red RV fxn. No date: Coronary artery disease     Comment:  a. mild, cath, 08/2010; b. medically managed No date: Decreased hearing     Comment:  Right ear No date: Depression No date: Gastric ulcer No date: GERD (gastroesophageal reflux disease) No date: Hypertension     Comment:  BP higher than usual 04/19/10; amlodipine increased by               telephone No date: Mod-Sev Tricuspid regurgitation     Comment:  a. 10/2018 Echo: Mod-Sev TR, PASP 35-44mmHg. 08/23/2015: RLS (restless legs syndrome) No date: S/P AVR     Comment:  a. [employer]. mechanical 2002; b. echo 08/2010 EF 60%,               trival AI, mild MR, AVR working well; c. on longterm  warfarin tx 10/11: SOB (shortness of breath)     Comment:  08/2010,Episodes at 5 AM, eventually felt to be anxiety,              after complete workup including catheterization, pt               greatly improved with anxiety meds 11/11                                      Reproductive/Obstetrics negative OB ROS                             Anesthesia Physical  Anesthesia Plan  ASA: IV  Anesthesia Plan: General   Post-op Pain Management:    Induction: Intravenous  PONV Risk Score and Plan: Propofol infusion  Airway Management Planned: Nasal Cannula  Additional Equipment:   Intra-op Plan:   Post-operative Plan:   Informed Consent: I have reviewed the patients History and Physical, chart, labs and discussed the procedure including the risks, benefits and alternatives for the proposed anesthesia with the patient or authorized representative who has indicated his/her understanding and acceptance.     Dental Advisory Given  Plan Discussed with: Anesthesiologist, CRNA and Surgeon  Anesthesia Plan Comments:         Anesthesia Quick Evaluation

## 2020-07-30 NOTE — Progress Notes (Addendum)
RN concerned about acute mental status change from previous. Hgb this am 5.6 slight drop from previous. No other associated symptoms or focal neurological deficit.  Acute metabolic encephalopathy in the setting of severe anemia Patient with hx of afib on anticoagulation which was stopped due to acute blood loss anemia. Patient reversed with vitamin K and Kcentra for INR>10, also received FFP.  -At risk for thrombotic event though have low suspicion for this in the absence of focal neurological deficit for which resolution would be delayed. - Will obtain STAT CT head  Acute blood loss anemia S/p 2 units PRBCs, FFP+Platelets+ Kcentra+Vitamin K - At least 2x IV access, 18 gauge or larger - IVF resuscitation to maintain MAP>65 - H&H monitoring q6h - Transfuse PRN Hgb<7 - Pantoprazole 80mg  IV x1 then gtt 8mg /hr - NPO for pending endoscopy - GI Consult for EGD/Colonoscopy - Hold NSAIDs, steroids, ASA, coumadin - Follow PT/INR      , BSN, MSN, DNP, CCRN,FNP-C  [facility] Nurse Practitioner  [place] [facility]

## 2020-07-30 NOTE — Interval H&P Note (Signed)
History and Physical Interval Note:  07/30/2020 3:51 PM  [name]  has presented today for surgery, with the diagnosis of Posthemorrhagic anemia, melena.  The various methods of treatment have been discussed with the patient and family. After consideration of risks, benefits and other options for treatment, the patient has consented to  Procedure(s): ESOPHAGOGASTRODUODENOSCOPY (EGD) (N/A) as a surgical intervention.  The patient's history has been reviewed, patient examined, no change in status, stable for surgery.  I have reviewed the patient's chart and labs.  Questions were answered to the patient's satisfaction.     Cannon Ball, [place]

## 2020-07-30 NOTE — Anesthesia Postprocedure Evaluation (Signed)
Anesthesia Post Note  Patient: [name]  Procedure(s) Performed: ESOPHAGOGASTRODUODENOSCOPY (EGD) (N/A )  Patient location during evaluation: PACU Anesthesia Type: General Level of consciousness: awake and alert and oriented Pain management: pain level controlled Vital Signs Assessment: post-procedure vital signs reviewed and stable Respiratory status: spontaneous breathing Cardiovascular status: blood pressure returned to baseline Anesthetic complications: no   No complications documented.   Last Vitals:  Vitals:   07/30/20 1709 07/30/20 1750  BP: (!) 113/56 (!) 120/59  Pulse: 87 76  Resp: 15 20  Temp:    SpO2: 97% 91%    Last Pain:  Vitals:   07/30/20 1750  TempSrc: Oral  PainSc: 0-No pain                 Kurtis Anastasia

## 2020-07-31 ENCOUNTER — Encounter
Admission: EM | Disposition: A | Discharge status: Skilled Nursing Facility | Payer: Self-pay | Source: Home / Self Care | Attending: Internal Medicine

## 2020-07-31 ENCOUNTER — Inpatient Hospital Stay: Payer: Medicare Other

## 2020-07-31 ENCOUNTER — Ambulatory Visit: Admit: 2020-07-31 | Payer: Medicare Other | Admitting: Internal Medicine

## 2020-07-31 ENCOUNTER — Encounter: Payer: Self-pay | Admitting: Family Medicine

## 2020-07-31 LAB — PROTIME-INR
INR: 1.5 — ABNORMAL HIGH (ref 0.8–1.2)
Prothrombin Time: 17.4 seconds — ABNORMAL HIGH (ref 11.4–15.2)

## 2020-07-31 LAB — BASIC METABOLIC PANEL
Anion gap: 12 (ref 5–15)
Anion gap: 8 (ref 5–15)
BUN: 80 mg/dL — ABNORMAL HIGH (ref 8–23)
BUN: 71 mg/dL — ABNORMAL HIGH (ref 8–23)
CO2: 27 mmol/L (ref 22–32)
CO2: 27 mmol/L (ref 22–32)
Calcium: 8.5 mg/dL — ABNORMAL LOW (ref 8.9–10.3)
Calcium: 8.2 mg/dL — ABNORMAL LOW (ref 8.9–10.3)
Chloride: 106 mmol/L (ref 98–111)
Chloride: 107 mmol/L (ref 98–111)
Creatinine, Ser: 1.24 mg/dL (ref 0.61–1.24)
Creatinine, Ser: 1.37 mg/dL — ABNORMAL HIGH (ref 0.61–1.24)
GFR calc Af Amer: >60 mL/min (ref >60)
GFR calc Af Amer: 54 mL/min — ABNORMAL LOW (ref >60)
GFR calc non Af Amer: 52 mL/min — ABNORMAL LOW (ref >60)
GFR calc non Af Amer: 46 mL/min — ABNORMAL LOW (ref >60)
Glucose, Bld: 118 mg/dL — ABNORMAL HIGH (ref 70–99)
Glucose, Bld: 138 mg/dL — ABNORMAL HIGH (ref 70–99)
Potassium: 3.2 mmol/L — ABNORMAL LOW (ref 3.5–5.1)
Potassium: 3.4 mmol/L — ABNORMAL LOW (ref 3.5–5.1)
Sodium: 145 mmol/L (ref 135–145)
Sodium: 142 mmol/L (ref 135–145)

## 2020-07-31 LAB — CBC
HCT: 23.7 % — ABNORMAL LOW (ref 39.0–52.0)
Hemoglobin: 8.1 g/dL — ABNORMAL LOW (ref 13.0–17.0)
MCH: 30.1 pg (ref 26.0–34.0)
MCHC: 34.2 g/dL (ref 30.0–36.0)
MCV: 88.1 fL (ref 80.0–100.0)
Platelets: 215 10*3/uL (ref 150–400)
RBC: 2.69 MIL/uL — ABNORMAL LOW (ref 4.22–5.81)
RDW: 17.8 % — ABNORMAL HIGH (ref 11.5–15.5)
WBC: 12.2 10*3/uL — ABNORMAL HIGH (ref 4.0–10.5)
nRBC: 0 % (ref 0.0–0.2)

## 2020-07-31 LAB — URINE CULTURE: Culture: NO GROWTH

## 2020-07-31 LAB — MRSA PCR SCREENING: MRSA by PCR: NEGATIVE

## 2020-07-31 LAB — HEPARIN LEVEL (UNFRACTIONATED): Heparin Unfractionated: 0.22 IU/mL — ABNORMAL LOW (ref 0.30–0.70)

## 2020-07-31 SURGERY — COLONOSCOPY WITH PROPOFOL
Anesthesia: General

## 2020-07-31 MED ORDER — WARFARIN SODIUM 2.5 MG PO TABS
2.5000 mg | ORAL_TABLET | Freq: Once | ORAL | Status: AC
  Administered 2020-07-31: 2.5 mg via ORAL
  Filled 2020-07-31: qty 1

## 2020-07-31 MED ORDER — FLEET ENEMA 7-19 GM/118ML RE ENEM: 1.0000 | ENEMA | Freq: Once | RECTAL | Status: DC

## 2020-07-31 MED ORDER — IOHEXOL 300 MG/ML  SOLN
100.0000 mL | Freq: Once | INTRAMUSCULAR | Status: AC | PRN
  Administered 2020-07-31: 100 mL via INTRAVENOUS

## 2020-07-31 MED ORDER — COLLAGENASE 250 UNIT/GM EX OINT
TOPICAL_OINTMENT | Freq: Every day | CUTANEOUS | Status: DC
  Filled 2020-07-31 (×2): qty 30

## 2020-07-31 MED ORDER — CHLORHEXIDINE GLUCONATE CLOTH 2 % EX PADS
6.0000 | MEDICATED_PAD | Freq: Every day | CUTANEOUS | Status: DC
  Administered 2020-08-03: 6 via TOPICAL

## 2020-07-31 NOTE — Progress Notes (Addendum)
SUBJECTIVE: Per patient's RN, patient near completion of oral bowel prep but has not had any bowel movement. He is complaining of abdominal pain associated with nausea without vomiting. On Exam abdomen is diffusely distended.  OBJECTIVE: On assessment, he was afebrile with blood pressure 153/70 mm Hg and pulse rate 98 beats/min. There were no focal neurological deficits; he was alert and oriented x4, and he did not demonstrate any memory deficits.   ASSESSMENT AND PLAN  Ileus - post procedure? Pt c/o abdominal pain, n/v, inability to tolerate po further, constipation and hypoactive bowel sounds. Abdominal xray shows worsening diffuse gaseous distention of bowel with marked gaseous distention of the stomach. Findings likely reflect ileus. -Keep NPO -Obtain CT abdomen to eval for SBO -Avoid opiods or meds that can worsen GI mortility -Monitor and replace electrolytes -Consider Reglan pendign CT abdomen -Insert NGT for decompression -Consider surgical consult if worsening symtpoms.  Acute blood loss anemia with hemorrhagic shock S/p Endoscopy POD # 1 S/p 2 units PRBCs,  S/p FFP+Platelets+ Kcentra+Vitamin K for supratherapeutic INR - IVF resuscitation to maintain MAP>65 - H&H monitoring q6h - Transfuse PRN Hgb<7 - Pantoprazole 80mg  IV x1 then gtt 8mg /hr - NPO for pending for EGD/Colonoscopy - Hold NSAIDs, steroids, ASA, coumadin - Follow PT/INR - GI following , appreciate input.      , BSN, MSN, DNP,  [facility] Nurse Practitioner  [place] [facility]

## 2020-07-31 NOTE — Progress Notes (Signed)
[facility] PROGRESS NOTE    Traven Davids  ZOX:[id number] DOB: 09-30-1931 DOA: 07/29/2020 PCP: Gracelyn Nurse, MD      Brief Narrative:  Mr. Cartlidge is a 86 y.o. M with dCHF, HTN, CAD, AS s/p St Jude AVR 2002, tricuspid regurg, chronic anemia, Cdiff remote, and PUD who presented with confusion from SNF.  Evidently, patient noted to be altered, out of breath and hypoxic at SNF acutely on day of admission.  In the ER, SpO2 < 90%, tahcypneic, Hgb 4 g/dL, INR >81, and rectal exam positive for blood.  Given PRBCs x2 units, PCCs, vitamin K and FFP.  Started on PPI, and GI consulted.         Assessment & Plan:  Acute hypoxic respiratory failure and hemorrhagic shock Patient presented with tachypnea and SpO2 < 90% Also lactic acid >4 at admission, no signs of infection.  Appears to be hemorrhagic shock due to anemia Sepsis ruled out  Shock has resolved.     Acute GI bleed Acute blood loss anemia on chronic anemia Acquired thrombophilia with supratherapeutic INR S/p EGD 9/5 by Dr. Norma Fredrickson INR > 10 at admission.  Patient admitted and started on PPI.  Given PCC, phytonadione and FFP at admission.   Underwent EGD 9/5 that showed no ulcers.  GI planned for colonoscopy, but despite completing entire bowel prep, patient had no BMs.  See below re: possible ileus  Therefore, Colonoscopy on hold.    No red blood from rectum since admission, some residual dark blood in watery BM today, likely residual. Hgb this afternoon increased to 8.1 g/dL  -Continue warfarin -Continue bridge with heparin gtt  -Trend Hgb -Transfusion threshold 8 g/dL given CHF and cardiogenic shock at presentation  -Consult GI, appreciate cares    Abdominal distension, possible very mild ileus Overnight, patient took entire bowel prep for colonoscopy but had no BM.  He developed bowel distension and pain.  X-ray seemed to show air filled loops.  Follow up CT showed no SBO, possible  ileus, possible left-over air from EGD.  This may be a very mild ileus (how else to explain no BM from bowel prep), although he has already having BMs this morning.  GI plan to postpone colonoscopy.  I am comfortable removing the NG tube, as he has started to have BM today and any mild ileus that was present is now resolved.    History of aortic stenosis s/p St Jude aortic valve  Resumed anticoagulation with heparin gtt after EGD showed no upper source.  -Continue heparin gtt -Resume warfarin, INR goal 2.5-3.5  Acute on chronic diastolic CHF Hypertension Coronary artery disease, secondary prevention CXR showed edema and he was hypoxic 9/5.  Weaned to room air today, intermittently hypoxic.  -Repeat Lasix -Strict I/Os, daily weights, telemetry  -Daily monitoring renal function -Continue metoprolol -Hold clonidine, doxazosin, Imdur  Hypokalemia Mag normal -Supplement K   Acute metabolic encephalopathy  Resolved Due to anemia, ongoing blood loss, delirium and hospitalized 86 year old.  No focal deficits to suggest stroke.  CT head unremarkable.  Restless leg syndrome -Continue ropinirole  Gout -Hold allopurinol for now   Depression -Hold duloxetine  BPH  -Hold finasteride             Disposition: Status is: Inpatient  Remains inpatient appropriate because:Inpatient level of care appropriate due to severity of illness   Dispo: The patient is from: SNF              Anticipated  d/c is to: SNF              Anticipated d/c date is: 3 days              Patient currently is not medically stable to d/c.       Patient admitted with supratherapeutic INR, GI bleeding, severe acute blood loss anemia.  EGD showed nothing.    Bleeding has slowed, but has not had brown BM yet, nor stabilized Hgb.  Will need to monitor stool, trend Hgb.  If Hgb stable, BM brown, and GI decide to defer colonoscopy, possibly back to SNF in 2-3 days.       MDM: The below  labs and imaging reports were reviewed and summarized above.  Medication management as above.  Anticoagulation managed.     DVT prophylaxis: SCDs Start: 07/29/20 1352  Code Status: Full code Family Communication: Daughter by phone    Consultants:   GI  Procedures:   EGD   Findings:      Moderate tortuosity of the mid to distal esophagus was noted compatible       with a diagnosis of Presbyesophagus.      A large hiatal hernia was present.      The examined duodenum was normal.      The exam was otherwise without abnormality.      There is no endoscopic evidence of bleeding, erythema or inflammatory       changes suggestive of gastritis, inflammation, ulceration, varices,       angioectasia or Dieulafoy lesions in the entire examined stomach. Impression:            - Large hiatal hernia.                        - Normal examined duodenum.                        - The examination was otherwise normal.                        - No specimens collected. Recommendation:        - Return patient to hospital ward for ongoing care.                        - Perform a colonoscopy tomorrow.                        - The findings and recommendations were discussed with                         the patient and their family.    Antimicrobials:   Cefepime, flagyl, Zosyn, vancomycin x1 9/4   Culture data:   9/4 blood culture x1 -- ngtd  9/4 urine culture -- ngtd           Subjective: Patient is feeling well.  No fever, no cough, no dysuria.    No hematochezia.  No vomiting.  Distension overnight, no more abdominal pain.  Some dark residual blood in stool today.  Objective: Vitals:   07/31/20 0430 07/31/20 0728 07/31/20 1138 07/31/20 1500  BP: (!) 153/70 (!) 117/54 124/80 124/67  Pulse: 98 85 86 85  Resp: 20 18 18    Temp: 98 F (36.7 C) (!) 97.5 F (36.4 C) 98 F (36.7 C)  98 F (36.7 C)  TempSrc: Oral Oral Oral Oral  SpO2: 100% 92% 100% 98%  Weight:      Height:         Intake/Output Summary (Last 24 hours) at 07/31/2020 1948 Last data filed at 07/31/2020 0400 Gross per 24 hour  Intake 115.98 ml  Output --  Net 115.98 ml   Filed Weights   07/29/20 0901  Weight: 96.3 kg    Examination: General appearance:  adult male, alert and in no acute distress.   HEENT: Anicteric, conjunctiva pink, lids and lashes normal. No nasal deformity, discharge, epistaxis.  Lips moist.  Dentures in place.  Oropharynx moist, no oral lesions, hearing normal. Skin: Warm and dry.  no jaundice.  No suspicious rashes or lesions. Cardiac: RRR, nl S1-S2, no murmurs appreciated.  Capillary refill is brisk  JVP normal.  No LE edema.  Radial pulses 2+ and symmetric. Respiratory: Normal respiratory rate and rhythm.  CTAB without rales or wheezes. Abdomen: Abdomen soft.  No TTP or guarding. No ascites, distension, hepatosplenomegaly.   MSK: No deformities or effusions. Neuro: Awake and alert.  EOMI, moves all extremities. Speech fluent.    Psych: Sensorium intact and responding to questions, attention normal. Affect normal.  Judgment and insight appear mildly impaired.    Data Reviewed: I have personally reviewed following labs and imaging studies:  CBC: Recent Labs  Lab 07/29/20 0903 07/29/20 0903 07/29/20 1138 07/29/20 1252 07/29/20 1729 07/30/20 0506 07/30/20 1433 07/30/20 1746 07/31/20 1427  WBC 10.3  --   --   --   --  12.1*  --  12.6* 12.2*  NEUTROABS 8.0*  --   --   --   --   --   --   --   --   HGB 4.1*   < > 6.3*   < > 5.7* 5.6* 7.3* 7.8* 8.1*  HCT 13.0*   < > 18.4*   < > 17.2* 16.6* 22.2* 23.4* 23.7*  MCV 90.9  --   --   --   --  87.4  --  88.6 88.1  PLT 257  --  222  --   --  239  --  200 215   < > = values in this interval not displayed.   Basic Metabolic Panel: Recent Labs  Lab 07/29/20 0903 07/30/20 0506 07/31/20 0226 07/31/20 1427  NA 142 145 145 142  K 3.5 3.0* 3.2* 3.4*  CL 103 108 106 107  CO2 GLUCOSE 139* 135* 118* 138*  BUN  120* 99* 80* 71*  CREATININE 1.42* 1.22 1.24 1.37*  CALCIUM 8.2* 7.9* 8.5* 8.2*  MG 2.6* 2.6*  --   --    GFR: Estimated Creatinine Clearance: 45.8 mL/min (A) (by C-G formula based on SCr of 1.37 mg/dL (H)). Liver Function Tests: Recent Labs  Lab 07/29/20 0903  AST 67*  ALT 39  ALKPHOS 160*  BILITOT 0.8  PROT 4.8*  ALBUMIN 2.5*   No results for input(s): LIPASE, AMYLASE in the last 168 hours. No results for input(s): AMMONIA in the last 168 hours. Coagulation Profile: Recent Labs  Lab 07/29/20 1245 07/30/20 0051 07/30/20 0506 07/30/20 0632 07/31/20 0226  INR 1.5* 1.3* 1.4* 1.4* 1.5*   Cardiac Enzymes: No results for input(s): CKTOTAL, CKMB, CKMBINDEX, TROPONINI in the last 168 hours. BNP (last 3 results) No results for input(s): PROBNP in the last 8760 hours. HbA1C: No results for input(s): HGBA1C in the last 72 hours.  CBG: No results for input(s): GLUCAP in the last 168 hours. Lipid Profile: No results for input(s): CHOL, HDL, LDLCALC, TRIG, CHOLHDL, LDLDIRECT in the last 72 hours. Thyroid Function Tests: No results for input(s): TSH, T4TOTAL, FREET4, T3FREE, THYROIDAB in the last 72 hours. Anemia Panel: No results for input(s): VITAMINB12, FOLATE, FERRITIN, TIBC, IRON, RETICCTPCT in the last 72 hours. Urine analysis:    Component Value Date/Time   COLORURINE YELLOW (A) 07/29/2020 0957   APPEARANCEUR CLEAR (A) 07/29/2020 0957   APPEARANCEUR Clear 03/20/2015 1146   LABSPEC 1.013 07/29/2020 0957   LABSPEC 1.033 03/20/2015 1146   PHURINE 5.0 07/29/2020 0957   GLUCOSEU NEGATIVE 07/29/2020 0957   GLUCOSEU Negative 03/20/2015 1146   HGBUR NEGATIVE 07/29/2020 0957   BILIRUBINUR NEGATIVE 07/29/2020 0957   BILIRUBINUR 1+ 03/20/2015 1146   KETONESUR NEGATIVE 07/29/2020 0957   PROTEINUR NEGATIVE 07/29/2020 0957   NITRITE NEGATIVE 07/29/2020 0957   LEUKOCYTESUR NEGATIVE 07/29/2020 0957   LEUKOCYTESUR Negative 03/20/2015 1146   Sepsis  Labs: @LABRCNTIP (procalcitonin:4,lacticacidven:4)  ) Recent Results (from the past 240 hour(s))  SARS Coronavirus 2 by RT PCR (hospital order, performed in [facility] hospital lab) Nasopharyngeal Nasopharyngeal Swab     Status: None   Collection Time: 07/29/20  9:54 AM   Specimen: Nasopharyngeal Swab  Result Value Ref Range Status   SARS Coronavirus 2 NEGATIVE NEGATIVE Final    Comment: (NOTE) SARS-CoV-2 target nucleic acids are NOT DETECTED.  The SARS-CoV-2 RNA is generally detectable in upper and lower respiratory specimens during the acute phase of infection. The lowest concentration of SARS-CoV-2 viral copies this assay can detect is 250 copies / mL. A negative result does not preclude SARS-CoV-2 infection and should not be used as the sole basis for treatment or other patient management decisions.  A negative result may occur with improper specimen collection / handling, submission of specimen other than nasopharyngeal swab, presence of viral mutation(s) within the areas targeted by this assay, and inadequate number of viral copies (<250 copies / mL). A negative result must be combined with clinical observations, patient history, and epidemiological information.  Fact Sheet for Patients:   BoilerBrush.com.cyhttps://www.fda.gov/media/136312/download  Fact Sheet for Healthcare Providers: https://pope.com/https://www.fda.gov/media/136313/download  This test is not yet approved or  cleared by the [place] FDA and has been authorized for detection and/or diagnosis of SARS-CoV-2 by FDA under an Emergency Use Authorization (EUA).  This EUA will remain in effect (meaning this test can be used) for the duration of the COVID-19 declaration under Section 564(b)(1) of the Act, 21 U.S.C. section 360bbb-3(b)(1), unless the authorization is terminated or revoked sooner.  Performed at [facility], [address] [place], [place] [address]   Blood culture (routine single)     Status: None (Preliminary  result)   Collection Time: 07/29/20  9:54 AM   Specimen: BLOOD  Result Value Ref Range Status   Specimen Description BLOOD RIGHT HAND  Final   Special Requests   Final    BOTTLES DRAWN AEROBIC AND ANAEROBIC Blood Culture adequate volume   Culture   Final    NO GROWTH 2 DAYS Performed at [facility], [address] [place], [place] [address]    Report Status PENDING  Incomplete  Urine culture     Status: None   Collection Time: 07/29/20  9:57 AM   Specimen: Urine, Random  Result Value Ref Range Status   Specimen Description   Final    URINE, RANDOM Performed at [facility], [address] [place], [place] [address]    Special Requests   Final    NONE Performed at [facility], [address] [place], [place] [address]    Culture   Final    NO GROWTH Performed at [facility], [address] [place], [place] [address]    Report Status 07/31/2020 FINAL  Final         Radiology Studies: DG Abd 1 View  Result Date: 07/31/2020 CLINICAL DATA:  Abdominal distention EXAM: ABDOMEN - 1 VIEW COMPARISON:  CT 07/29/2020 FINDINGS: Diffuse gaseous distention of bowel, increasing since prior CT. Marked gaseous distention of the stomach. Findings likely reflect ileus. No free air organomegaly. Bilateral hip replacements and degenerative changes in the lumbar spine. IMPRESSION: Worsening diffuse gaseous distention of bowel with marked gaseous distention of the stomach. Findings likely reflect ileus. Electronically Signed   By: Charlett Nose M.D.   On: 07/31/2020 02:52   CT HEAD WO CONTRAST  Result Date: 07/30/2020 CLINICAL DATA:  Acute neuro deficit with stroke suspected EXAM: CT HEAD WITHOUT CONTRAST TECHNIQUE: Contiguous axial images were obtained from the base of the skull through the vertex without intravenous contrast. COMPARISON:  07/19/2012 FINDINGS: Brain: No evidence of acute infarction, hemorrhage, hydrocephalus, extra-axial collection or mass  lesion/mass effect. Generalized cerebral volume loss with expected progression from 2013. Normal white matter appearance. Vascular: No hyperdense vessel or unexpected calcification. Skull: Normal. Negative for fracture or focal lesion. Sinuses/Orbits: No acute finding. IMPRESSION: Aging brain without acute or reversible finding. Electronically Signed   By: Marnee Spring M.D.   On: 07/30/2020 07:32   CT ABDOMEN PELVIS W CONTRAST  Result Date: 07/31/2020 CLINICAL DATA:  Abdominal distension, evaluate for bowel obstruction. Confirm NG tube placement. EXAM: CT ABDOMEN AND PELVIS WITH CONTRAST TECHNIQUE: Multidetector CT imaging of the abdomen and pelvis was performed using the standard protocol following bolus administration of intravenous contrast. CONTRAST:  OMNIPAQUE IOHEXOL 300 MG/ML  SOLN COMPARISON:  CTA abdomen/pelvis dated 07/29/2020 FINDINGS: Lower chest: Small bilateral pleural effusions. Associated bilateral lower lobe atelectasis. Hepatobiliary: Liver is within normal limits. Layering small gallstones (series 2/image 37), without associated inflammatory changes. No intrahepatic or extrahepatic ductal dilatation. Pancreas: Within normal limits. Spleen: Within normal limits. Adrenals/Urinary Tract: Adrenal glands are within normal limits. 9.0 cm cyst along the anterior right lower kidney (series 2/image 49), with faint calcified peripheral calcifications (for example, series 2/image 46), benign (Bosniak II). Left kidney is within normal limits. No hydronephrosis. Bladder is decompressed and is cured by streak artifact. Stomach/Bowel: Large hiatal hernia. Enteric tube terminates at the GE junction within the intrathoracic portion of the stomach (series 2/image 14). Advancement is suggested. Multiple dilated loops of small bowel in the central abdomen. Terminal ileum is decompressed (series 2/image 56). However, for the most part this reflects a gradual reduction in caliber rather than an abrupt  transition, and scattered narrowed loops can be found elsewhere in the left mid abdomen. As such, differential considerations include adynamic ileus (favored) versus partial small bowel obstruction. Notably, there were no findings to suggest small bowel obstruction on the prior. Right colon is relatively normal and fluid-filled. Transverse colon is relatively normal and gas filled. Rectosigmoid colon is relatively normal and stool-filled, noting sigmoid diverticulosis, without evidence of diverticulitis. Overall, this appearance supports adynamic ileus over complete small bowel obstruction, although early/partial small bowel obstruction is still possible. Vascular/Lymphatic: No evidence of abdominal aortic aneurysm. Atherosclerotic calcifications of the abdominal aorta and branch vessels. No suspicious abdominopelvic lymphadenopathy.  Reproductive: Prostate is poorly visualized due to streak artifact. Other: No abdominopelvic ascites. Mild body wall edema. No free air. Musculoskeletal: Degenerative changes of the visualized thoracolumbar spine. Bilateral hip arthroplasties, without evidence of complication, but leading to streak artifact across the pelvis. Median sternotomy, incompletely visualized. IMPRESSION: Multiple dilated loops of small bowel in the central abdomen, new from the prior, favoring adynamic small bowel ileus. Early/partial small bowel obstruction is still possible. Large hiatal hernia. Enteric tube terminates at the GE junction within the intrathoracic portion of the stomach. Advancement is suggested. Small bilateral pleural effusions. Associated bilateral lower lobe atelectasis. Cholelithiasis, without associated inflammatory changes. Electronically Signed   By: Charline Bills M.D.   On: 07/31/2020 07:00   DG Chest Port 1 View  Result Date: 07/31/2020 CLINICAL DATA:  Hypoxia EXAM: PORTABLE CHEST 1 VIEW COMPARISON:  07/29/2020 FINDINGS: Cardiomegaly. Prior median sternotomy and valve  replacement. Vascular congestion. Possible early interstitial edema. No effusions or acute bony abnormality. IMPRESSION: Cardiomegaly with vascular congestion and possible early interstitial edema. Electronically Signed   By: Charlett Nose M.D.   On: 07/31/2020 02:53        Scheduled Meds: . [START ON 08/01/2020] Chlorhexidine Gluconate Cloth  6 each Topical Q0600  . collagenase   Topical Daily  . metoprolol succinate  12.5 mg Oral Daily  . pantoprazole (PROTONIX) IV  40 mg Intravenous Q12H  . rOPINIRole  2 mg Oral QID  . sodium phosphate  1 enema Rectal Once  . Warfarin - Pharmacist Dosing Inpatient   Does not apply q1600   Continuous Infusions: . sodium chloride 10 mL/hr at 07/30/20 2224  . heparin 1,300 Units/hr (07/31/20 1600)     LOS: 2 days    Time spent: 25 minutes    Alberteen Sam, MD [facility] 07/31/2020, 7:48 PM     Please page though AMION or Epic secure chat:  For [employer], Higher education careers adviser

## 2020-07-31 NOTE — Consult Note (Signed)
WOC Nurse Consult Note: Reason for Consult:venous stasis wounds to bilateral lower legs. Seen at wound care center.  Wounds have thin slough to wound bed. Chronic skin changes legs but no edema. Will initiate enzymatic debridement to these wounds and wrap with compression prior to discharge.  Wound type:venous insufficiency Pressure Injury POA: NA Measurement: Left medial lower leg:  2.5 cm x 1 cm slough to bed Right anterior lower leg:   1 cm x 0.5 cm slough to wound bed.  Wound bed:100% yellow fibrin slough Drainage (amount, consistency, odor) minimal serosanguinous no odor.  Periwound:dry skin, chronic skin changes.  Dressing procedure/placement/frequency: Cleanse lower legs with NS and pat dry Apply Santyl to open wounds  Cover with NS moist 2x2.  Secure with dry dressing and kerlix/tape. Change daily.   Will follow.  Plan to wrap with compression on discharge.  Maple Hudson MSN, RN, FNP-BC CWON Wound, Ostomy, Continence Nurse Pager [phone]

## 2020-07-31 NOTE — Progress Notes (Signed)
Per Dr. Maryfrances Bunnell, leave NG tube off of suction until he and Dr. Norma Fredrickson assess patient this AM.

## 2020-07-31 NOTE — Progress Notes (Signed)
GI Inpatient Follow-up Note  Subjective:  Patient seen in follow-up for acute post hemorrhagic anemia, melena. He had EGD performed yesterday which was essentially negative with no sources of anemia or bleeding identified. He completed all of his oral bowel prep last night without any subsequent bowel movements. He complained of abdominal pain, distention, nausea, and inability to tolerate PO intake. He had abd x-ray showing worsening diffuse gaseous distention of bowel with marked gaseous distention of the stomach, likely reflecting adynamic ileus. CT abd/pelvis with contrast showed multiple dilated loops of small bowel in central abdomen suggestive of small bowel ileus with early/pSBO possible. No definitive transition point. The NG tube was noted to be at [facility].   Patient seen and examined this morning with daughter bedside. He is just now starting to have some stool output, but is having a lot of straining. Output is dark and tarry. He denies any fever, nausea, vomiting, or abdominal pain. He is passing small amounts of flatus. His NG tube was advanced further by 8 cm and marked at left nare at 55 cm. No new complaints by patient. Heparin has been started.   Scheduled Inpatient Medications:  . collagenase   Topical Daily  . metoprolol succinate  12.5 mg Oral Daily  . pantoprazole (PROTONIX) IV  40 mg Intravenous Q12H  . rOPINIRole  2 mg Oral QID  . sodium phosphate  1 enema Rectal Once  . Warfarin - Pharmacist Dosing Inpatient   Does not apply q1600    Continuous Inpatient Infusions:   . sodium chloride 10 mL/hr at 07/30/20 2224  . heparin 1,100 Units/hr (07/30/20 2232)    PRN Inpatient Medications:  ondansetron **OR** ondansetron (ZOFRAN) IV  Review of Systems: Constitutional: Weight is stable.  Eyes: No changes in vision. ENT: No oral lesions, sore throat.  GI: see HPI.  Heme/Lymph: No easy bruising.  CV: No chest pain.  GU: No hematuria.  Integumentary: No rashes.  Neuro: No  headaches.  Psych: No depression/anxiety.  Endocrine: No heat/cold intolerance.  Allergic/Immunologic: No urticaria.  Resp: No cough, SOB.  Musculoskeletal: No joint swelling.    Physical Examination: BP 124/80 (BP Location: Left Arm)   Pulse 86   Temp 98 F (36.7 C) (Oral)   Resp 18   Ht 5\' 11"  (1.803 m)   Wt 96.3 kg   SpO2 100%   BMI 29.60 kg/m  Gen: NAD, alert and oriented x 4 HEENT: PEERLA, EOMI, NG tube in place to low intermittent suction Neck: supple, no JVD or thyromegaly Chest: CTA bilaterally, no wheezes, crackles, or other adventitious sounds CV: RRR, no m/g/c/r Abd: soft, mild distention, hypoactive BS in all four quadrants; nontender to palpation in all four quadrants, no HSM, guarding, ridigity, or rebound tenderness Ext: no edema, well perfused with 2+ pulses, Skin: no rash or lesions noted Lymph: no LAD  Data: Lab Results  Component Value Date   WBC 12.6 (H) 07/30/2020   HGB 7.8 (L) 07/30/2020   HCT 23.4 (L) 07/30/2020   MCV 88.6 07/30/2020   PLT 200 07/30/2020   Recent Labs  Lab 07/30/20 0506 07/30/20 1433 07/30/20 1746  HGB 5.6* 7.3* 7.8*   Lab Results  Component Value Date   NA 145 07/31/2020   K 3.2 (L) 07/31/2020   CL 106 07/31/2020   CO2 27 07/31/2020   BUN 80 (H) 07/31/2020   CREATININE 1.24 07/31/2020   Lab Results  Component Value Date   ALT 39 07/29/2020   AST 67 (H) 07/29/2020  ALKPHOS 160 (H) 07/29/2020   BILITOT 0.8 07/29/2020   Recent Labs  Lab 07/29/20 1138 07/29/20 1245 07/31/20 0226  APTT 55*  --   --   INR 2.6*   < > 1.5*   < > = values in this interval not displayed.   EGD 07/30/2020: Findings:      Moderate tortuosity of the mid to distal esophagus was noted compatible       with a diagnosis of Presbyesophagus.      A large hiatal hernia was present.      The examined duodenum was normal.      The exam was otherwise without abnormality.      There is no endoscopic evidence of bleeding, erythema or  inflammatory       changes suggestive of gastritis, inflammation, ulceration, varices,   Assessment/Plan:  86 y/o Caucasian male with a PMH of aortic stenosis s/p mechanical TAVR on Coumadin, chronic diastolic CHF, HTN, CAD, chronic venous stasis, GERD, and Hx of c diff presented to the ED 09/04 via EMS from SNF for respiratory distress and lethargy  1. Profound anemia/melena- hemoglobin 4.1 on admission s/p transfusion with hemoglobin 5.6 yesterday with transfusion ongoing, heme positive stool with no overt gastrointestinal blood loss. CTA negative for any active GI bleeding. Hemoglobin s/p transfusion yesterday 7.8. He denied morning labs.  2. Small bowel ileus - found last night after he complained of marked abdominal distention after finishing oral bowel prep for anticipated colonoscopy today. CT showed no definite transition point to suggest SBO.  3. Supratherapeutic INR - INR >10, PT >90on admission improved to 1.5 this morning s/p FFP+plt+Kcentra+VitK+pRBCs.   4. Hx of gastric ulcer - found on endoscopy 2016  5. Hemorrhagic shock/Sepsis - receiving IVF and broad spectrum antibiotics. Improving.  6. Atrial fibrillation- Heparin has been started  Recommendations:  -Colonoscopy planned for today has been cancelled due to small bowel ileus -Agree with NG tube placement to low intermittent suction -Advise conservative management of small bowel ileus with NG tube suction, NPO status, IV fluid hydration, and serial abdominal examinations  -Repeat CBC and closely monitor H&H and transfuse to ensure Hgb >7.0. -Avoid all opioid and anticholinergic medications  -Closely monitor electrolytes with special attention of potassium. Goal >4.0. -Advise colonoscopy when clinically feasible after resolution of ileus -Agree with Heparin -Following   Please call with questions or concerns.    Jacob Moores, PA-C [facility] [phone] (Cell)

## 2020-07-31 NOTE — Progress Notes (Signed)
NG tube removed per Dr. Sheryn Bison verbal order.  Patient getting small sips of water with his medications.  Warfarin resumed and patient still on heparin gtt.  He is still having very loose dark tarry stools throughout the shift.  Hgb remains stable.

## 2020-07-31 NOTE — Progress Notes (Signed)
Patient ok to have ice chips during dayshift today.

## 2020-07-31 NOTE — Progress Notes (Signed)
ANTICOAGULATION CONSULT NOTE  Pharmacy Consult for Heparin bridge to Warfarin Indication: aortic stenosis s/p St Jude mechanical AVR (2002)  Patient Measurements: Height: 5\' 11"  (180.3 cm) Weight: 96.3 kg (212 lb 3.2 oz) IBW/kg (Calculated) : 75.3  Heparin DW: 94.8 kg  Vital Signs: Temp: 98 F (36.7 C) (09/06 0430) Temp Source: Oral (09/06 0430) BP: 153/70 (09/06 0430) Pulse Rate: 98 (09/06 0430)  Labs: Recent Labs    07/29/20 0903 07/29/20 0903 07/29/20 1138 07/29/20 1245 07/30/20 0506 07/30/20 0506 07/30/20 0632 07/30/20 1433 07/30/20 1746 07/31/20 0226  HGB 4.1*   < > 6.3*   < > 5.6*   < >  --  7.3* 7.8*  --   HCT 13.0*   < > 18.4*   < > 16.6*  --   --  22.2* 23.4*  --   PLT 257   < > 222  --  239  --   --   --  200  --   APTT  --   --  55*  --   --   --   --   --   --   --   LABPROT >90.0*   < > 26.7*   < > 16.2*  --  16.2*  --   --  17.4*  INR >10.0*   < > 2.6*   < > 1.4*  --  1.4*  --   --  1.5*  CREATININE 1.42*  --   --   --  1.22  --   --   --   --  1.24  TROPONINIHS 82*  --  80*  --   --   --   --   --   --   --    < > = values in this interval not displayed.    Estimated Creatinine Clearance: 50.6 mL/min (by C-G formula based on SCr of 1.24 mg/dL).   Medical History: Past Medical History:  Diagnosis Date  . Anxiety 10/11  . Aortic stenosis    a. s/p mechcanical AVR, 2002; b. 10/2018 Echo: Triv AI, mean grad 11/2018.  . Bradycardia    chronic, no symptoms 07/2010  . C. difficile colitis   . Carotid bruit    dopplers in past, no abnormalities  . Chronic diastolic CHF (congestive heart failure) (HCC)    a. Echo 03/2015: EF 60-65%, no RWMA, GR1DD, mild BAE, mild to mod MR, mod TR, PASP 65 mmHg; b. 01/2017 Echo: EF 55-60%, NRWMA, grade 1 diastolic dysfunction.  Normal functioning prosthetic aortic valve.  Mean gradient 50 mmHg.  Sev TR. PASP 02/2017; c. 10/2018 Echo: EF 55-60%, Triv AI, mod dil LA, mod-sev TR, PASP 35-40, mild to mod red RV fxn.  . Coronary  artery disease    a. mild, cath, 08/2010; b. medically managed  . Decreased hearing    Right ear  . Depression   . Gastric ulcer   . GERD (gastroesophageal reflux disease)   . Hypertension    BP higher than usual 04/19/10; amlodipine increased by telephone  . Mod-Sev Tricuspid regurgitation    a. 10/2018 Echo: Mod-Sev TR, PASP 35-12mmHg.  43m RLS (restless legs syndrome) 08/23/2015  . S/P AVR    a. St. Jude. mechanical 2002; b. echo 08/2010 EF 60%, trival AI, mild MR, AVR working well; c. on longterm warfarin tx  . SOB (shortness of breath) 10/11   08/2010,Episodes at 5 AM, eventually felt to be anxiety, after complete workup including catheterization, pt greatly  improved with anxiety meds 11/11    Medications:  PTA warfarin 5mg  daily  Assessment: Pharmacy has been consulted for warfarin dosing and monitoring in an 86yo male with a PMH significant for aortic stenosis s/p St. Jude mechanical AVR in 2002 (on Coumadin), chronic diastolic HF, HTN, CAD, chronic venous stasis. Upon admission patient Hgb 4, Hct 14, and INR >10; rectal exam revealed heme positive stools. Patient received 2 units pRBC, 10mg  IV vitamin K x1, Kcentra, and 1 unit FFP. INR improved to 1.4.   Hgb has improved 4.1 >> 7.3 since admission. Continue to monitor closely for signs of re-bleeding with resumption of anticoagulation. EGD today did not identify source of bleeding. Colonoscopy is planned for 07/31/20.   Warfarin:  Date INR Plan  07/29/20 > 10 Hold warfarin  07/30/20 1.4 Warfarin 2.5 mg  07/30/20 1.5 Warfarin 2.5 mg     Goal of Therapy:  INR 2.5-3.5 (per Coumadin clinic) Heparin level 0.3 - 0.7 IU/mL Monitor platelets by anticoagulation protocol: Yes   Plan:   Warfarin  Repeat warfarin 2.5mg  tonight in light of patient coming in with supratherapeutic INR on his home regimen  Monitor daily INR  Heparin  Heparin level subtherapeutic: increase infusion to 1300 units/hr, no bolus given bleeding  concerns  Re-check heparin level 8 hours after rate change  Daily CBC per protocol  Will need to continue bridge for at least 5 days and until INR > 2.5 for two days   09/29/20 07/31/2020 6:54 AM

## 2020-07-31 NOTE — Progress Notes (Signed)
Advanced NG tube by 8cm per MD.  Currently marked at left nare at 55cm.

## 2020-08-01 ENCOUNTER — Encounter: Payer: Self-pay | Admitting: Internal Medicine

## 2020-08-01 LAB — CBC
HCT: 25.9 % — ABNORMAL LOW (ref 39.0–52.0)
Hemoglobin: 8.4 g/dL — ABNORMAL LOW (ref 13.0–17.0)
MCH: 29.3 pg (ref 26.0–34.0)
MCHC: 32.4 g/dL (ref 30.0–36.0)
MCV: 90.2 fL (ref 80.0–100.0)
Platelets: 260 10*3/uL (ref 150–400)
RBC: 2.87 MIL/uL — ABNORMAL LOW (ref 4.22–5.81)
RDW: 17.8 % — ABNORMAL HIGH (ref 11.5–15.5)
WBC: 14.3 10*3/uL — ABNORMAL HIGH (ref 4.0–10.5)
nRBC: 0.1 % (ref 0.0–0.2)

## 2020-08-01 LAB — PROTIME-INR
INR: 2.1 — ABNORMAL HIGH (ref 0.8–1.2)
Prothrombin Time: 22.7 seconds — ABNORMAL HIGH (ref 11.4–15.2)

## 2020-08-01 LAB — BASIC METABOLIC PANEL
Anion gap: 11 (ref 5–15)
BUN: 69 mg/dL — ABNORMAL HIGH (ref 8–23)
CO2: 27 mmol/L (ref 22–32)
Calcium: 8.3 mg/dL — ABNORMAL LOW (ref 8.9–10.3)
Chloride: 105 mmol/L (ref 98–111)
Creatinine, Ser: 1.3 mg/dL — ABNORMAL HIGH (ref 0.61–1.24)
GFR calc Af Amer: 57 mL/min — ABNORMAL LOW (ref >60)
GFR calc non Af Amer: 49 mL/min — ABNORMAL LOW (ref >60)
Glucose, Bld: 108 mg/dL — ABNORMAL HIGH (ref 70–99)
Potassium: 3 mmol/L — ABNORMAL LOW (ref 3.5–5.1)
Sodium: 143 mmol/L (ref 135–145)

## 2020-08-01 LAB — HEPARIN LEVEL (UNFRACTIONATED)
Heparin Unfractionated: 0.31 IU/mL (ref 0.30–0.70)
Heparin Unfractionated: 0.44 IU/mL (ref 0.30–0.70)

## 2020-08-01 LAB — HEMOGLOBIN AND HEMATOCRIT, BLOOD
HCT: 25.6 % — ABNORMAL LOW (ref 39.0–52.0)
Hemoglobin: 8.3 g/dL — ABNORMAL LOW (ref 13.0–17.0)

## 2020-08-01 MED ORDER — POTASSIUM CHLORIDE CRYS ER 20 MEQ PO TBCR
40.0000 meq | EXTENDED_RELEASE_TABLET | Freq: Once | ORAL | Status: AC
  Administered 2020-08-01: 40 meq via ORAL
  Filled 2020-08-01: qty 2

## 2020-08-01 MED ORDER — POLYETHYLENE GLYCOL 3350 17 GM/SCOOP PO POWD
1.0000 | Freq: Once | ORAL | Status: AC
  Administered 2020-08-01: 255 g via ORAL
  Filled 2020-08-01: qty 255

## 2020-08-01 MED ORDER — PHYTONADIONE 5 MG PO TABS
2.5000 mg | ORAL_TABLET | Freq: Once | ORAL | Status: AC
  Administered 2020-08-01: 2.5 mg via ORAL
  Filled 2020-08-01: qty 1

## 2020-08-01 NOTE — Progress Notes (Signed)
GI Inpatient Follow-up Note  Subjective:  Patient seen in follow-up for acute post hemorrhagic anemia and melena. His adynamic ileus has resolved and NG tube was pulled last night. He had multiple loose, dark, and tarry BMs yesterday and few this morning. Hemoglobin has remained stable. He denies any fevers, chills, nausea, vomiting, abdominal pain. He is passing gas and stool. Daughter - [name] at bedside. No new complaints.   Scheduled Inpatient Medications:   Chlorhexidine Gluconate Cloth  6 each Topical Q0600   collagenase   Topical Daily   metoprolol succinate  12.5 mg Oral Daily   rOPINIRole  2 mg Oral QID   Warfarin - Pharmacist Dosing Inpatient   Does not apply q1600    Continuous Inpatient Infusions:    sodium chloride Stopped (07/31/20 2330)   heparin 1,300 Units/hr (08/01/20 0600)    PRN Inpatient Medications:  ondansetron **OR** ondansetron (ZOFRAN) IV  Review of Systems: Constitutional: Weight is stable.  Eyes: No changes in vision. ENT: No oral lesions, sore throat.  GI: see HPI.  Heme/Lymph: No easy bruising.  CV: No chest pain.  GU: No hematuria.  Integumentary: No rashes.  Neuro: No headaches.  Psych: No depression/anxiety.  Endocrine: No heat/cold intolerance.  Allergic/Immunologic: No urticaria.  Resp: No cough, SOB.  Musculoskeletal: No joint swelling.    Physical Examination: BP 129/62 (BP Location: Left Arm)    Pulse 82    Temp (!) 97.5 F (36.4 C) (Oral)    Resp 19    Ht 5\' 11"  (1.803 m)    Wt 96.3 kg    SpO2 (!) 82%    BMI 29.60 kg/m  Gen: NAD, alert and oriented x 4 HEENT: PEERLA, EOMI, Neck: supple, no JVD or thyromegaly Chest: CTA bilaterally, no wheezes, crackles, or other adventitious sounds CV: RRR, no m/g/c/r Abd: soft, NT, ND, +BS in all four quadrants; no HSM, guarding, ridigity, or rebound tenderness Ext: no edema, well perfused with 2+ pulses, Skin: no rash or lesions noted Lymph: no LAD  Data: Lab Results   Component Value Date   WBC 14.3 (H) 08/01/2020   HGB 8.4 (L) 08/01/2020   HCT 25.9 (L) 08/01/2020   MCV 90.2 08/01/2020   PLT 260 08/01/2020   Recent Labs  Lab 07/30/20 1746 07/31/20 1427 08/01/20 0015  HGB 7.8* 8.1* 8.4*   Lab Results  Component Value Date   NA 143 08/01/2020   K 3.0 (L) 08/01/2020   CL 105 08/01/2020   CO2 27 08/01/2020   BUN 69 (H) 08/01/2020   CREATININE 1.30 (H) 08/01/2020   Lab Results  Component Value Date   ALT 39 07/29/2020   AST 67 (H) 07/29/2020   ALKPHOS 160 (H) 07/29/2020   BILITOT 0.8 07/29/2020   Recent Labs  Lab 07/29/20 1138 07/29/20 1245 08/01/20 0015  APTT 55*  --   --   INR 2.6*   < > 2.1*   < > = values in this interval not displayed.   Assessment/Plan:  86 y/o Caucasian male with a PMH of aortic stenosis s/p mechanical TAVR on Coumadin, chronic diastolic CHF, HTN, CAD, chronic venous stasis, GERD, and Hx of c diff presented to the ED09/04via EMS from SNF for respiratory distress and lethargy found to have profound anemia (Hgb 4.1)  1. Profound anemia/melena- hemoglobin 4.1on admission s/p transfusion with hemoglobin 5.6 yesterday with transfusion ongoing, heme positive stool with no overt gastrointestinal blood loss. CTA negative for any active GI bleeding. Hemoglobin stable at 8.4. He  is having tarry stools.  2. Small bowel ileus - resolved, NG tube pulled last night  3. Supratherapeutic INR - Warfarin restarted last night along with Heparin. INR 2.1 this morning.   4. Hx of gastric ulcer - found on endoscopy 2016  5. Hemorrhagic shock/Sepsis - receiving IVF and broad spectrum antibiotics. Improving.  6. Atrial fibrillation- Heparin and Warfarin resumed  Recommendations:  -Plan for colonoscopy tomorrow with Dr. Norma Fredrickson. Patient and daughter were in agreement with plan and all of their questions were answered.  -Continue to monitor H&H closely and transfuse as needed to ensure Hgb >7.0. -Repeat INR in the  morning. Goal INR <1.7  -Clear liquid diet today. NPO after midnight. He will complete Miralax/Gatorade bowel prep starting 1600 -See procedure note for findings and further recommendations   I reviewed the risks (including bleeding, perforation, infection, anesthesia complications, cardiac/respiratory complications), benefits and alternatives of colonoscopy. Patient consents to proceed.     Please call with questions or concerns.    Jacob Moores, PA-C [facility] [phone] (Cell)

## 2020-08-01 NOTE — Progress Notes (Addendum)
ANTICOAGULATION CONSULT NOTE  Pharmacy Consult for Heparin bridge to Warfarin Indication: aortic stenosis s/p St Jude mechanical AVR (2002)  Patient Measurements: Height: 5\' 11"  (180.3 cm) Weight: 96.3 kg (212 lb 3.2 oz) IBW/kg (Calculated) : 75.3  Heparin DW: 94.8 kg  Vital Signs: Temp: 98 F (36.7 C) (09/07 0823) Temp Source: Oral (09/07 0823) BP: 140/61 (09/07 0823) Pulse Rate: 80 (09/07 0823)  Labs: Recent Labs    07/29/20 1138 07/29/20 1245 07/30/20 0506 07/30/20 09/29/20 07/30/20 1433 07/30/20 1746 07/30/20 1746 07/31/20 0226 07/31/20 1427 08/01/20 0015 08/01/20 0828  HGB 6.3*   < >  --   --    < > 7.8*   < >  --  8.1* 8.4*  --   HCT 18.4*   < >  --   --    < > 23.4*  --   --  23.7* 25.9*  --   PLT 222   < >  --   --   --  200  --   --  215 260  --   APTT 55*  --   --   --   --   --   --   --   --   --   --   LABPROT 26.7*   < >  --  16.2*  --   --   --  17.4*  --  22.7*  --   INR 2.6*   < >  --  1.4*  --   --   --  1.5*  --  2.1*  --   HEPARINUNFRC  --   --   --   --   --   --   --   --  0.22* 0.31 0.44  CREATININE  --    < >   < >  --   --   --   --  1.24 1.37* 1.30*  --   TROPONINIHS 80*  --   --   --   --   --   --   --   --   --   --    < > = values in this interval not displayed.    Estimated Creatinine Clearance: 48.3 mL/min (A) (by C-G formula based on SCr of 1.3 mg/dL (H)).   Medical History: Past Medical History:  Diagnosis Date  . Anxiety 10/11  . Aortic stenosis    a. s/p mechcanical AVR, 2002; b. 10/2018 Echo: Triv AI, mean grad 11/2018.  . Bradycardia    chronic, no symptoms 07/2010  . C. difficile colitis   . Carotid bruit    dopplers in past, no abnormalities  . Chronic diastolic CHF (congestive heart failure) (HCC)    a. Echo 03/2015: EF 60-65%, no RWMA, GR1DD, mild BAE, mild to mod MR, mod TR, PASP 65 mmHg; b. 01/2017 Echo: EF 55-60%, NRWMA, grade 1 diastolic dysfunction.  Normal functioning prosthetic aortic valve.  Mean gradient 50 mmHg.   Sev TR. PASP 02/2017; c. 10/2018 Echo: EF 55-60%, Triv AI, mod dil LA, mod-sev TR, PASP 35-40, mild to mod red RV fxn.  . Coronary artery disease    a. mild, cath, 08/2010; b. medically managed  . Decreased hearing    Right ear  . Depression   . Gastric ulcer   . GERD (gastroesophageal reflux disease)   . Hypertension    BP higher than usual 04/19/10; amlodipine increased by telephone  . Mod-Sev Tricuspid regurgitation    a.  10/2018 Echo: Mod-Sev TR, PASP 35-28mmHg.  Marland Kitchen RLS (restless legs syndrome) 08/23/2015  . S/P AVR    a. St. Jude. mechanical 2002; b. echo 08/2010 EF 60%, trival AI, mild MR, AVR working well; c. on longterm warfarin tx  . SOB (shortness of breath) 10/11   08/2010,Episodes at 5 AM, eventually felt to be anxiety, after complete workup including catheterization, pt greatly improved with anxiety meds 11/11    Medications:  PTA warfarin 5mg  daily  Assessment: Pharmacy has been consulted for warfarin dosing and monitoring in an 86yo male with a PMH significant for aortic stenosis s/p St. Jude mechanical AVR in 2002 (on Coumadin), chronic diastolic HF, HTN, CAD, chronic venous stasis. Upon admission patient Hgb 4, Hct 14, and INR >10; rectal exam revealed heme positive stools. Patient received 2 units pRBC, 10mg  IV vitamin K x1, Kcentra, and 1 unit FFP. INR improved to 1.4.   Hgb has improved 4.1 >> 7.3 since admission. Continue to monitor closely for signs of re-bleeding with resumption of anticoagulation. EGD today did not identify source of bleeding. Colonoscopy is planned for 07/31/20.   Warfarin:  Date INR Plan  07/29/20 > 10 Hold warfarin  07/30/20 1.4 Warfarin 2.5 mg  07/31/20 1.5 Warfarin 2.5 mg  08/01/20 2.1      Goal of Therapy:  INR 2.5-3.5 (per Coumadin clinic) Heparin level 0.3 - 0.7 IU/mL Monitor platelets by anticoagulation protocol: Yes   Plan:   Heparin 09/07 @ 0828 HL 0.44 therapeutic. Will continue current rate and recheck HL and CBC with AM labs and  continue to monitor.  Warfarin INR subtherapeutic at 2.1, increased by 0.6 since yesterday. Will continue warfarin 2.5mg  tonight.  Monitor daily INR  10/01/20, PharmD Pharmacy Resident  08/01/2020 9:46 AM

## 2020-08-01 NOTE — Evaluation (Signed)
Physical Therapy Evaluation Patient Details Name: Marvis Saefong MRN: [id number] DOB: 08/05/1931 Today's Date: 08/01/2020   History of Present Illness  Ramonte Termaine Roupp is a 86 y.o. male with medical history significant for aortic stenosis s/p mechanical TAVR on Coumadin, chronic diastolic heart failure, hypertension, CAD, chronic venous stasis, GERD and history of C. difficile who was sent to the ER from the skilled nursing facility for evaluation of respiratory distress and altered mental status.  Patient was said to be lethargic and had room air pulse oximetry in the low 80s.  When EMS arrived patient was placed on nonrebreather mask with improvement in his pulse oximetry to 90%.  His blood pressure was 91/38 in the field by EMS and he was able to answer questions and follow simple commands.    Clinical Impression  Patient received in bed, states his bed is full of poop. Daughter present. Asks to use bedpan. Patient assisted to sitting edge of bed with min assist to raise trunk. Performed sit to stand with min assist from elevated surface. Pivoted bed to [facility] with min assist and B UE support on bed/BSC. He required extended time on [facility] and was assisted back to bed. He will continue to benefit from skilled PT while here to improve strength and functional independence.      Follow Up Recommendations SNF    Equipment Recommendations  None recommended by PT    Recommendations for Other Services       Precautions / Restrictions Precautions Precautions: Fall Precaution Comments: high fall risk Restrictions Weight Bearing Restrictions: No      Mobility  Bed Mobility Overal bed mobility: Needs Assistance Bed Mobility: Supine to Sit     Supine to sit: Min assist     General bed mobility comments: Patient was positioned poorly in bed upon arrival, therefore needed min assist to go from supine to sit. He was able to return to supine independently  Transfers Overall transfer  level: Needs assistance   Transfers: Stand Pivot Transfers Sit to Stand: Min assist;From elevated surface Stand pivot transfers: Min assist       General transfer comment: patient able to perform stand pivot from bed to [facility] then back to bed with min assist. Assist to manage lines as well.  Ambulation/Gait             General Gait Details: patient unable to amblate further at this time  Stairs            Wheelchair Mobility    Modified Rankin (Stroke Patients Only)       Balance Overall balance assessment: Needs assistance Sitting-balance support: Feet supported Sitting balance-Leahy Scale: Fair     Standing balance support: Bilateral upper extremity supported;During functional activity Standing balance-Leahy Scale: Fair Standing balance comment: requires B UE assist for standing pivot                             Pertinent Vitals/Pain Pain Assessment: No/denies pain    Home Living Family/patient expects to be discharged to:: Skilled nursing facility               Home Equipment: Dan Humphreys - 4 wheels;Bedside commode;Grab bars - toilet;Grab bars - tub/shower;Wheelchair - power      Prior Function Level of Independence: Independent with assistive device(s)         Comments: Patient has been at SNF since last admission. Planning to return to SNF then hopefully  home.     Hand Dominance        Extremity/Trunk Assessment   Upper Extremity Assessment Upper Extremity Assessment: Generalized weakness    Lower Extremity Assessment Lower Extremity Assessment: Generalized weakness    Cervical / Trunk Assessment Cervical / Trunk Assessment: Normal  Communication   Communication: HOH  Cognition Arousal/Alertness: Awake/alert Behavior During Therapy: WFL for tasks assessed/performed Overall Cognitive Status: Within Functional Limits for tasks assessed                                        General Comments       Exercises     Assessment/Plan    PT Assessment Patient needs continued PT services  PT Problem List Decreased strength;Decreased mobility;Decreased activity tolerance;Decreased balance       PT Treatment Interventions Therapeutic activities;DME instruction;Gait training;Therapeutic exercise;Functional mobility training;Balance training;Patient/family education    PT Goals (Current goals can be found in the Care Plan section)  Acute Rehab PT Goals Patient Stated Goal: to return to SNF PT Goal Formulation: With patient/family Time For Goal Achievement: 08/15/20 Potential to Achieve Goals: Good    Frequency Min 2X/week   Barriers to discharge        Co-evaluation               AM-PAC PT "6 Clicks" Mobility  Outcome Measure Help needed turning from your back to your side while in a flat bed without using bedrails?: A Little Help needed moving from lying on your back to sitting on the side of a flat bed without using bedrails?: A Little Help needed moving to and from a bed to a chair (including a wheelchair)?: A Little Help needed standing up from a chair using your arms (e.g., wheelchair or bedside chair)?: A Little Help needed to walk in hospital room?: A Lot Help needed climbing 3-5 steps with a railing? : Total 6 Click Score: 15    End of Session Equipment Utilized During Treatment: Oxygen Activity Tolerance: Patient limited by fatigue Patient left: in bed;with bed alarm set;with call bell/phone within reach;with family/visitor present Nurse Communication: Mobility status PT Visit Diagnosis: Unsteadiness on feet (R26.81);Muscle weakness (generalized) (M62.81);Other abnormalities of gait and mobility (R26.89);Difficulty in walking, not elsewhere classified (R26.2)    Time: 1125-1202 PT Time Calculation (min) (ACUTE ONLY): 37 min   Charges:   PT Evaluation $PT Eval Moderate Complexity: 1 Mod PT Treatments $Therapeutic Activity: 8-22 mins        Anamika Kueker, PT, GCS 08/01/20,1:11 PM

## 2020-08-01 NOTE — Progress Notes (Signed)
ANTICOAGULATION CONSULT NOTE  Pharmacy Consult for Heparin bridge to Warfarin Indication: aortic stenosis s/p St Jude mechanical AVR (2002)  Patient Measurements: Height: 5\' 11"  (180.3 cm) Weight: 96.3 kg (212 lb 3.2 oz) IBW/kg (Calculated) : 75.3  Heparin DW: 94.8 kg  Vital Signs: Temp: 97.6 F (36.4 C) (09/06 2118) Temp Source: Oral (09/06 2118) BP: 149/68 (09/06 2118) Pulse Rate: 91 (09/06 2118)  Labs: Recent Labs    07/29/20 0903 07/29/20 0903 07/29/20 1138 07/29/20 1245 07/30/20 0506 07/30/20 0632 07/30/20 1433 07/30/20 1746 07/30/20 1746 07/31/20 0226 07/31/20 1427 08/01/20 0015  HGB 4.1*   < > 6.3*   < >  --   --    < > 7.8*   < >  --  8.1* 8.4*  HCT 13.0*   < > 18.4*   < >  --   --    < > 23.4*  --   --  23.7* 25.9*  PLT 257   < > 222   < >  --   --   --  200  --   --  215 260  APTT  --   --  55*  --   --   --   --   --   --   --   --   --   LABPROT >90.0*   < > 26.7*   < >  --  16.2*  --   --   --  17.4*  --  22.7*  INR >10.0*   < > 2.6*   < >  --  1.4*  --   --   --  1.5*  --  2.1*  HEPARINUNFRC  --   --   --   --   --   --   --   --   --   --  0.22* 0.31  CREATININE 1.42*  --   --    < >   < >  --   --   --   --  1.24 1.37* 1.30*  TROPONINIHS 82*  --  80*  --   --   --   --   --   --   --   --   --    < > = values in this interval not displayed.    Estimated Creatinine Clearance: 48.3 mL/min (A) (by C-G formula based on SCr of 1.3 mg/dL (H)).   Medical History: Past Medical History:  Diagnosis Date  . Anxiety 10/11  . Aortic stenosis    a. s/p mechcanical AVR, 2002; b. 10/2018 Echo: Triv AI, mean grad 11/2018.  . Bradycardia    chronic, no symptoms 07/2010  . C. difficile colitis   . Carotid bruit    dopplers in past, no abnormalities  . Chronic diastolic CHF (congestive heart failure) (HCC)    a. Echo 03/2015: EF 60-65%, no RWMA, GR1DD, mild BAE, mild to mod MR, mod TR, PASP 65 mmHg; b. 01/2017 Echo: EF 55-60%, NRWMA, grade 1 diastolic dysfunction.   Normal functioning prosthetic aortic valve.  Mean gradient 50 mmHg.  Sev TR. PASP 02/2017; c. 10/2018 Echo: EF 55-60%, Triv AI, mod dil LA, mod-sev TR, PASP 35-40, mild to mod red RV fxn.  . Coronary artery disease    a. mild, cath, 08/2010; b. medically managed  . Decreased hearing    Right ear  . Depression   . Gastric ulcer   . GERD (gastroesophageal reflux disease)   .  Hypertension    BP higher than usual 04/19/10; amlodipine increased by telephone  . Mod-Sev Tricuspid regurgitation    a. 10/2018 Echo: Mod-Sev TR, PASP 35-61mmHg.  Marland Kitchen RLS (restless legs syndrome) 08/23/2015  . S/P AVR    a. St. Jude. mechanical 2002; b. echo 08/2010 EF 60%, trival AI, mild MR, AVR working well; c. on longterm warfarin tx  . SOB (shortness of breath) 10/11   08/2010,Episodes at 5 AM, eventually felt to be anxiety, after complete workup including catheterization, pt greatly improved with anxiety meds 11/11    Medications:  PTA warfarin 5mg  daily  Assessment: Pharmacy has been consulted for warfarin dosing and monitoring in an 86yo male with a PMH significant for aortic stenosis s/p St. Jude mechanical AVR in 2002 (on Coumadin), chronic diastolic HF, HTN, CAD, chronic venous stasis. Upon admission patient Hgb 4, Hct 14, and INR >10; rectal exam revealed heme positive stools. Patient received 2 units pRBC, 10mg  IV vitamin K x1, Kcentra, and 1 unit FFP. INR improved to 1.4.   Hgb has improved 4.1 >> 7.3 since admission. Continue to monitor closely for signs of re-bleeding with resumption of anticoagulation. EGD today did not identify source of bleeding. Colonoscopy is planned for 07/31/20.   Warfarin:  Date INR Plan  07/29/20 > 10 Hold warfarin  07/30/20 1.4 Warfarin 2.5 mg  07/30/20 1.5 Warfarin 2.5 mg     Goal of Therapy:  INR 2.5-3.5 (per Coumadin clinic) Heparin level 0.3 - 0.7 IU/mL Monitor platelets by anticoagulation protocol: Yes   Plan:   Heparin 09/07 @ 0130 HL 0.31 therapeutic. Will continue  current rate and recheck HL at 0800 and continue to monitor.  Will need to continue bridge for at least 5 days and until INR > 2.5 for two days  09/29/20, PharmD, BCPS Clinical Pharmacist 08/01/2020 2:02 AM

## 2020-08-01 NOTE — Care Management Important Message (Signed)
Important Message  Patient Details  Name: [name] MRN: [id number] Date of Birth: June 19, 1931   Medicare Important Message Given:  Yes     [name] 08/01/2020, 11:59 AM

## 2020-08-01 NOTE — Progress Notes (Signed)
[facility] PROGRESS NOTE    Keiston Manley  WNU:[id number] DOB: October 01, 1931 DOA: 07/29/2020 PCP: Gracelyn Nurse, MD      Brief Narrative:  Mr. Patil is a 86 y.o. M with dCHF, HTN, CAD, AS s/p St Jude AVR 2002, tricuspid regurg, chronic anemia, Cdiff remote, and PUD who presented with confusion from SNF.  Evidently, patient noted to be altered, out of breath and hypoxic at SNF acutely on day of admission.  In the ER, SpO2 < 90%, tahcypneic, Hgb 4 g/dL, INR >03, and rectal exam positive for blood.  Given PRBCs x2 units, PCCs, vitamin K and FFP.  Started on PPI, and GI consulted.         Assessment & Plan:  Acute hypoxic respiratory failure and hemorrhagic shock Patient presented with tachypnea and SpO2 < 90% Also lactic acid >4 at admission, no signs of infection.  Appears to be hemorrhagic shock due to anemia Sepsis ruled out  Shock has resolved.     Acute GI bleed Acute blood loss anemia on chronic anemia Acquired thrombophilia with supratherapeutic INR S/p EGD 9/5 by Dr. Norma Fredrickson INR > 10 at admission.  Patient admitted and started on PPI.  Given PCC, phytonadione and FFP at admission.   Underwent EGD 9/5 that showed no ulcers.  Follow up colonoscopy has been delayed by mild ileus, but patient having watery clearing stools in the last 24 hours, prep appears to be adequate.  GI plan for colonscopy tomorrow.    Some residual dark watery stools today, no red blood.   Hgb stable around 8-8.5 g/dL   -Hold warfarin until after colonoscopy tomorrow, then will resume -Continue heparin bridge  -Trend Hgb -Transfusion threshold 8 g/dL given CHF and cardiogenic shock at presentation  -Consult GI, appreciate cares    Abdominal distension, possible very mild ileus Paitent took bowel prep 9/5 evening and actually didn't have bowel movements for >12 hours.  This gradually improved.  History of aortic stenosis s/p St Jude aortic valve  Patient's  INR was reversed on admission.  After EGD showed no upper source, anticoagulation was resumed with heparin bridge and patient has had no recurrent bleeding.  -Continue heparin gtt -Hold warfarin until after colonoscopy, INR goal 2.5-3.5 -Possibly Lovenox bridge after colonoscopy, will discuss with pharmacy   Acute on chronic diastolic CHF Hypertension Coronary artery disease, secondary prevention CXR showed edema and he was hypoxic 9/5.    Treated with Lasix, and this got better.    -Continue metoprolol -Hold clonidine, doxazosin, Imdur  Hypokalemia Mag normal -Supplement K   Acute metabolic encephalopathy/Delirium Mild Delirium precautions:   -Lights and TV off, minimize interruptions at night  -Blinds open and lights on during day  -Glasses/hearing aid with patient  -Frequent reorientation  -PT/OT when able  -Avoid sedation medications/Beers list medications    Restless leg syndrome -Continue ropinirole  Gout -Hold allopurinol for now   Depression -Hold duloxetine  BPH  -Hold finasteride             Disposition: Status is: Inpatient  Remains inpatient appropriate because:Inpatient level of care appropriate due to severity of illness   Dispo: The patient is from: SNF              Anticipated d/c is to: SNF              Anticipated d/c date is: 3 days              Patient currently is  not medically stable to d/c.       Patient admitted with supratherapeutic INR, GI bleeding, severe acute blood loss anemia.  EGD showed nothing.    Will obtain colonoscpy tomorrow.    If Hgb stable tomorrow, if GI work up done, and if no further hematochezia, will discharge back to SNF tomorrow afternoon, or evening         MDM: The below labs and imaging reports were reviewed and summarized above.  Medication management as above.  Anticoagulation managed.     DVT prophylaxis: SCDs Start: 07/29/20 1352  Code Status: Full code Family  Communication: Daughter by phone    Consultants:   GI  Procedures:   EGD   Findings:      Moderate tortuosity of the mid to distal esophagus was noted compatible       with a diagnosis of Presbyesophagus.      A large hiatal hernia was present.      The examined duodenum was normal.      The exam was otherwise without abnormality.      There is no endoscopic evidence of bleeding, erythema or inflammatory       changes suggestive of gastritis, inflammation, ulceration, varices,       angioectasia or Dieulafoy lesions in the entire examined stomach. Impression:            - Large hiatal hernia.                        - Normal examined duodenum.                        - The examination was otherwise normal.                        - No specimens collected. Recommendation:        - Return patient to hospital ward for ongoing care.                        - Perform a colonoscopy tomorrow.                        - The findings and recommendations were discussed with                         the patient and their family.    Antimicrobials:   Cefepime, flagyl, Zosyn, vancomycin x1 9/4   Culture data:   9/4 blood culture x1 -- ngtd  9/4 urine culture -- ngtd           Subjective: Patient is feeling well.  No fever, no cough, no dysuria.    No hematochezia.  No vomiting.   Some dark residual blood in stool today still, mild.    Objective: Vitals:   08/01/20 0823 08/01/20 1211 08/01/20 1300 08/01/20 1614  BP: 140/61 129/62  (!) 138/59  Pulse: 80 82  77  Resp: Temp: 98 F (36.7 C) (!) 97.5 F (36.4 C)  97.8 F (36.6 C)  TempSrc: Oral Oral  Oral  SpO2: 92% (!) 83% (!) 82% 93%  Weight:      Height:        Intake/Output Summary (Last 24 hours) at 08/01/2020 1928 Last data filed at 08/01/2020 1534 Gross per 24 hour  Intake  988.15 ml  Output 1200 ml  Net -211.85 ml   Filed Weights   07/29/20 0901  Weight: 96.3 kg    Examination: General appearance:   adult male, alert and in no acute distress.   HEENT: Anicteric, conjunctiva pink, lids and lashes normal. No nasal deformity, discharge, epistaxis.  Lips moist.  Dentures in place.  Oropharynx moist, no oral lesions, hearing normal. Skin: Warm and dry.  no jaundice.  No suspicious rashes or lesions. Cardiac: RRR, nl S1-S2, no murmurs appreciated.  Capillary refill is brisk  JVP normal.  No LE edema.  Radial pulses 2+ and symmetric. Respiratory: Normal respiratory rate and rhythm.  CTAB without rales or wheezes. Abdomen: Abdomen soft.  No TTP or guarding. No ascites, distension, hepatosplenomegaly.   MSK: No deformities or effusions. Neuro: Awake and alert.  EOMI, moves all extremities. Speech fluent.    Psych: Sensorium intact and responding to questions, attention normal. Affect normal.  Judgment and insight appear mildly impaired.    Data Reviewed: I have personally reviewed following labs and imaging studies:  CBC: Recent Labs  Lab 07/29/20 0903 07/29/20 0903 07/29/20 1138 07/29/20 1252 07/30/20 0506 07/30/20 0506 07/30/20 1433 07/30/20 1746 07/31/20 1427 08/01/20 0015 08/01/20 1836  WBC 10.3  --   --   --  12.1*  --   --  12.6* 12.2* 14.3*  --   NEUTROABS 8.0*  --   --   --   --   --   --   --   --   --   --   HGB 4.1*   < > 6.3*   < > 5.6*   < > 7.3* 7.8* 8.1* 8.4* 8.3*  HCT 13.0*   < > 18.4*   < > 16.6*   < > 22.2* 23.4* 23.7* 25.9* 25.6*  MCV 90.9  --   --   --  87.4  --   --  88.6 88.1 90.2  --   PLT 257   < > 222  --  239  --   --  200 215 260  --    < > = values in this interval not displayed.   Basic Metabolic Panel: Recent Labs  Lab 07/29/20 0903 07/30/20 0506 07/31/20 0226 07/31/20 1427 08/01/20 0015  NA 142 145 145 142 143  K 3.5 3.0* 3.2* 3.4* 3.0*  CL 103 108 106 107 105  CO2 27 28 27 27 27   GLUCOSE 139* 135* 118* 138* 108*  BUN 120* 99* 80* 71* 69*  CREATININE 1.42* 1.22 1.24 1.37* 1.30*  CALCIUM 8.2* 7.9* 8.5* 8.2* 8.3*  MG 2.6* 2.6*  --   --   --     GFR: Estimated Creatinine Clearance: 48.3 mL/min (A) (by C-G formula based on SCr of 1.3 mg/dL (H)). Liver Function Tests: Recent Labs  Lab 07/29/20 0903  AST 67*  ALT 39  ALKPHOS 160*  BILITOT 0.8  PROT 4.8*  ALBUMIN 2.5*   No results for input(s): LIPASE, AMYLASE in the last 168 hours. No results for input(s): AMMONIA in the last 168 hours. Coagulation Profile: Recent Labs  Lab 07/30/20 0051 07/30/20 0506 07/30/20 0632 07/31/20 0226 08/01/20 0015  INR 1.3* 1.4* 1.4* 1.5* 2.1*   Cardiac Enzymes: No results for input(s): CKTOTAL, CKMB, CKMBINDEX, TROPONINI in the last 168 hours. BNP (last 3 results) No results for input(s): PROBNP in the last 8760 hours. HbA1C: No results for input(s): HGBA1C in the last 72 hours. CBG: No results for  input(s): GLUCAP in the last 168 hours. Lipid Profile: No results for input(s): CHOL, HDL, LDLCALC, TRIG, CHOLHDL, LDLDIRECT in the last 72 hours. Thyroid Function Tests: No results for input(s): TSH, T4TOTAL, FREET4, T3FREE, THYROIDAB in the last 72 hours. Anemia Panel: No results for input(s): VITAMINB12, FOLATE, FERRITIN, TIBC, IRON, RETICCTPCT in the last 72 hours. Urine analysis:    Component Value Date/Time   COLORURINE YELLOW (A) 07/29/2020 0957   APPEARANCEUR CLEAR (A) 07/29/2020 0957   APPEARANCEUR Clear 03/20/2015 1146   LABSPEC 1.013 07/29/2020 0957   LABSPEC 1.033 03/20/2015 1146   PHURINE 5.0 07/29/2020 0957   GLUCOSEU NEGATIVE 07/29/2020 0957   GLUCOSEU Negative 03/20/2015 1146   HGBUR NEGATIVE 07/29/2020 0957   BILIRUBINUR NEGATIVE 07/29/2020 0957   BILIRUBINUR 1+ 03/20/2015 1146   KETONESUR NEGATIVE 07/29/2020 0957   PROTEINUR NEGATIVE 07/29/2020 0957   NITRITE NEGATIVE 07/29/2020 0957   LEUKOCYTESUR NEGATIVE 07/29/2020 0957   LEUKOCYTESUR Negative 03/20/2015 1146   Sepsis Labs: @LABRCNTIP (procalcitonin:4,lacticacidven:4)  ) Recent Results (from the past 240 hour(s))  SARS Coronavirus 2 by RT PCR  (hospital order, performed in [facility] hospital lab) Nasopharyngeal Nasopharyngeal Swab     Status: None   Collection Time: 07/29/20  9:54 AM   Specimen: Nasopharyngeal Swab  Result Value Ref Range Status   SARS Coronavirus 2 NEGATIVE NEGATIVE Final    Comment: (NOTE) SARS-CoV-2 target nucleic acids are NOT DETECTED.  The SARS-CoV-2 RNA is generally detectable in upper and lower respiratory specimens during the acute phase of infection. The lowest concentration of SARS-CoV-2 viral copies this assay can detect is 250 copies / mL. A negative result does not preclude SARS-CoV-2 infection and should not be used as the sole basis for treatment or other patient management decisions.  A negative result may occur with improper specimen collection / handling, submission of specimen other than nasopharyngeal swab, presence of viral mutation(s) within the areas targeted by this assay, and inadequate number of viral copies (<250 copies / mL). A negative result must be combined with clinical observations, patient history, and epidemiological information.  Fact Sheet for Patients:   09/28/20  Fact Sheet for Healthcare Providers: BoilerBrush.com.cy  This test is not yet approved or  cleared by the https://pope.com/ FDA and has been authorized for detection and/or diagnosis of SARS-CoV-2 by FDA under an Emergency Use Authorization (EUA).  This EUA will remain in effect (meaning this test can be used) for the duration of the COVID-19 declaration under Section 564(b)(1) of the Act, 21 U.S.C. section 360bbb-3(b)(1), unless the authorization is terminated or revoked sooner.  Performed at [facility], [address] [place], [place]   Blood culture (routine single)     Status: None (Preliminary result)   Collection Time: 07/29/20  9:54 AM   Specimen: BLOOD  Result Value Ref Range Status   Specimen Description BLOOD RIGHT  HAND  Final   Special Requests   Final    BOTTLES DRAWN AEROBIC AND ANAEROBIC Blood Culture adequate volume   Culture   Final    NO GROWTH 3 DAYS Performed at [facility], [address], [place], [place]    Report Status PENDING  Incomplete  Urine culture     Status: None   Collection Time: 07/29/20  9:57 AM   Specimen: Urine, Random  Result Value Ref Range Status   Specimen Description   Final    URINE, RANDOM Performed at [facility], [address] [place], [place]  Special Requests   Final    NONE Performed at [facility], [address] [place], [place] [address]    Culture   Final    NO GROWTH Performed at [facility], [address], [place], [place] [address]    Report Status 07/31/2020 FINAL  Final  MRSA PCR Screening     Status: None   Collection Time: 07/31/20  8:25 PM   Specimen: Nasal Mucosa; Nasopharyngeal  Result Value Ref Range Status   MRSA by PCR NEGATIVE NEGATIVE Final    Comment:        The GeneXpert MRSA Assay (FDA approved for NASAL specimens only), is one component of a comprehensive MRSA colonization surveillance program. It is not intended to diagnose MRSA infection nor to guide or monitor treatment for MRSA infections. Performed at [facility], [address], [place], [place] [address]          Radiology Studies: DG Abd 1 View  Result Date: 07/31/2020 CLINICAL DATA:  Abdominal distention EXAM: ABDOMEN - 1 VIEW COMPARISON:  CT 07/29/2020 FINDINGS: Diffuse gaseous distention of bowel, increasing since prior CT. Marked gaseous distention of the stomach. Findings likely reflect ileus. No free air organomegaly. Bilateral hip replacements and degenerative changes in the lumbar spine. IMPRESSION: Worsening diffuse gaseous distention of bowel with marked gaseous distention of the stomach. Findings likely reflect ileus. Electronically Signed   By: Charlett Nose M.D.   On:  07/31/2020 02:52   CT ABDOMEN PELVIS W CONTRAST  Result Date: 07/31/2020 CLINICAL DATA:  Abdominal distension, evaluate for bowel obstruction. Confirm NG tube placement. EXAM: CT ABDOMEN AND PELVIS WITH CONTRAST TECHNIQUE: Multidetector CT imaging of the abdomen and pelvis was performed using the standard protocol following bolus administration of intravenous contrast. CONTRAST:  OMNIPAQUE IOHEXOL 300 MG/ML  SOLN COMPARISON:  CTA abdomen/pelvis dated 07/29/2020 FINDINGS: Lower chest: Small bilateral pleural effusions. Associated bilateral lower lobe atelectasis. Hepatobiliary: Liver is within normal limits. Layering small gallstones (series 2/image 37), without associated inflammatory changes. No intrahepatic or extrahepatic ductal dilatation. Pancreas: Within normal limits. Spleen: Within normal limits. Adrenals/Urinary Tract: Adrenal glands are within normal limits. 9.0 cm cyst along the anterior right lower kidney (series 2/image 49), with faint calcified peripheral calcifications (for example, series 2/image 46), benign (Bosniak II). Left kidney is within normal limits. No hydronephrosis. Bladder is decompressed and is cured by streak artifact. Stomach/Bowel: Large hiatal hernia. Enteric tube terminates at the GE junction within the intrathoracic portion of the stomach (series 2/image 14). Advancement is suggested. Multiple dilated loops of small bowel in the central abdomen. Terminal ileum is decompressed (series 2/image 56). However, for the most part this reflects a gradual reduction in caliber rather than an abrupt transition, and scattered narrowed loops can be found elsewhere in the left mid abdomen. As such, differential considerations include adynamic ileus (favored) versus partial small bowel obstruction. Notably, there were no findings to suggest small bowel obstruction on the prior. Right colon is relatively normal and fluid-filled. Transverse colon is relatively normal and gas filled.  Rectosigmoid colon is relatively normal and stool-filled, noting sigmoid diverticulosis, without evidence of diverticulitis. Overall, this appearance supports adynamic ileus over complete small bowel obstruction, although early/partial small bowel obstruction is still possible. Vascular/Lymphatic: No evidence of abdominal aortic aneurysm. Atherosclerotic calcifications of the abdominal aorta and branch vessels. No suspicious abdominopelvic lymphadenopathy. Reproductive: Prostate is poorly visualized due to streak artifact. Other: No abdominopelvic ascites. Mild body wall edema. No free air. Musculoskeletal: Degenerative  changes of the visualized thoracolumbar spine. Bilateral hip arthroplasties, without evidence of complication, but leading to streak artifact across the pelvis. Median sternotomy, incompletely visualized. IMPRESSION: Multiple dilated loops of small bowel in the central abdomen, new from the prior, favoring adynamic small bowel ileus. Early/partial small bowel obstruction is still possible. Large hiatal hernia. Enteric tube terminates at the GE junction within the intrathoracic portion of the stomach. Advancement is suggested. Small bilateral pleural effusions. Associated bilateral lower lobe atelectasis. Cholelithiasis, without associated inflammatory changes. Electronically Signed   By: Charline Bills M.D.   On: 07/31/2020 07:00   DG Chest Port 1 View  Result Date: 07/31/2020 CLINICAL DATA:  Hypoxia EXAM: PORTABLE CHEST 1 VIEW COMPARISON:  07/29/2020 FINDINGS: Cardiomegaly. Prior median sternotomy and valve replacement. Vascular congestion. Possible early interstitial edema. No effusions or acute bony abnormality. IMPRESSION: Cardiomegaly with vascular congestion and possible early interstitial edema. Electronically Signed   By: Charlett Nose M.D.   On: 07/31/2020 02:53        Scheduled Meds: . Chlorhexidine Gluconate Cloth  6 each Topical Q0600  . collagenase   Topical Daily  .  metoprolol succinate  12.5 mg Oral Daily  . rOPINIRole  2 mg Oral QID   Continuous Infusions: . sodium chloride Stopped (07/31/20 2330)  . heparin 1,300 Units/hr (08/01/20 1641)     LOS: 3 days    Time spent: 25 minutes    Alberteen Sam, MD [facility] 08/01/2020, 7:28 PM     Please page though AMION or Epic secure chat:  For [employer], Higher education careers adviser

## 2020-08-02 ENCOUNTER — Encounter
Admission: EM | Disposition: A | Discharge status: Skilled Nursing Facility | Payer: Self-pay | Source: Home / Self Care | Attending: Internal Medicine

## 2020-08-02 DIAGNOSIS — Z952 Presence of prosthetic heart valve: Secondary | ICD-10-CM

## 2020-08-02 DIAGNOSIS — L8990 Pressure ulcer of unspecified site, unspecified stage: Secondary | ICD-10-CM | POA: Insufficient documentation

## 2020-08-02 LAB — BPAM RBC
Blood Product Expiration Date: 202109292359
Blood Product Expiration Date: 202109292359
Blood Product Expiration Date: 202109292359
Blood Product Expiration Date: 202110012359
Blood Product Expiration Date: 202110032359
Blood Product Expiration Date: 202110032359
ISSUE DATE / TIME: 202109040947
ISSUE DATE / TIME: 202109040947
ISSUE DATE / TIME: 202109042200
ISSUE DATE / TIME: 202109050839
ISSUE DATE / TIME: 202109051134
Unit Type and Rh: 5100
Unit Type and Rh: 5100
Unit Type and Rh: 5100
Unit Type and Rh: 5100
Unit Type and Rh: 5100
Unit Type and Rh: 5100

## 2020-08-02 LAB — TYPE AND SCREEN
ABO/RH(D): O POS
Antibody Screen: NEGATIVE
Unit division: 0
Unit division: 0
Unit division: 0
Unit division: 0
Unit division: 0
Unit division: 0

## 2020-08-02 LAB — BASIC METABOLIC PANEL
Anion gap: 6 (ref 5–15)
BUN: 49 mg/dL — ABNORMAL HIGH (ref 8–23)
CO2: 27 mmol/L (ref 22–32)
Calcium: 8.1 mg/dL — ABNORMAL LOW (ref 8.9–10.3)
Chloride: 109 mmol/L (ref 98–111)
Creatinine, Ser: 1.08 mg/dL (ref 0.61–1.24)
GFR calc Af Amer: >60 mL/min (ref >60)
GFR calc non Af Amer: >60 mL/min (ref >60)
Glucose, Bld: 112 mg/dL — ABNORMAL HIGH (ref 70–99)
Potassium: 3.2 mmol/L — ABNORMAL LOW (ref 3.5–5.1)
Sodium: 142 mmol/L (ref 135–145)

## 2020-08-02 LAB — CBC
HCT: 23.1 % — ABNORMAL LOW (ref 39.0–52.0)
Hemoglobin: 7.6 g/dL — ABNORMAL LOW (ref 13.0–17.0)
MCH: 30 pg (ref 26.0–34.0)
MCHC: 32.9 g/dL (ref 30.0–36.0)
MCV: 91.3 fL (ref 80.0–100.0)
Platelets: 241 10*3/uL (ref 150–400)
RBC: 2.53 MIL/uL — ABNORMAL LOW (ref 4.22–5.81)
RDW: 17.7 % — ABNORMAL HIGH (ref 11.5–15.5)
WBC: 9.9 10*3/uL (ref 4.0–10.5)
nRBC: 0.5 % — ABNORMAL HIGH (ref 0.0–0.2)

## 2020-08-02 LAB — GLUCOSE, CAPILLARY
Glucose-Capillary: 99 mg/dL (ref 70–99)
Glucose-Capillary: 110 mg/dL — ABNORMAL HIGH (ref 70–99)

## 2020-08-02 LAB — PROTIME-INR
INR: 2.4 — ABNORMAL HIGH (ref 0.8–1.2)
Prothrombin Time: 25.6 seconds — ABNORMAL HIGH (ref 11.4–15.2)

## 2020-08-02 LAB — HEPARIN LEVEL (UNFRACTIONATED): Heparin Unfractionated: 0.38 IU/mL (ref 0.30–0.70)

## 2020-08-02 SURGERY — COLONOSCOPY
Anesthesia: General

## 2020-08-02 MED ORDER — POTASSIUM CHLORIDE CRYS ER 20 MEQ PO TBCR
40.0000 meq | EXTENDED_RELEASE_TABLET | ORAL | Status: AC
  Administered 2020-08-02: 40 meq via ORAL
  Filled 2020-08-02: qty 2

## 2020-08-02 MED ORDER — SODIUM CHLORIDE 0.9 % IV SOLN: INTRAVENOUS | Status: DC

## 2020-08-02 NOTE — Progress Notes (Signed)
[facility] Gastroenterology Inpatient Progress Note  Subjective: Patient seen for pre-procedure evaluation. Patient did not have heparin discontinued as planned. Coumadin was previously resumed for 2 days by mistake of the primary team and INR was 2.4 today. Hgb has drifted down to 7.6. Patient refused to finish bowel prep last evening and is mildly confused today.  Objective: Vital signs in last 24 hours: Temp:  [97.8 F (36.6 C)-98.4 F (36.9 C)] 98 F (36.7 C) (09/08 1130) Pulse Rate:  [77-91] 91 (09/08 1130) Resp:  [18-20] 20 (09/08 1130) BP: (128-141)/(57-88) 140/88 (09/08 1130) SpO2:  [91 %-93 %] 93 % (09/08 1130) Weight:  [94.2 kg] 94.2 kg (09/08 0629) Blood pressure 140/88, pulse 91, temperature 98 F (36.7 C), temperature source Oral, resp. rate 20, height 5\' 11"  (1.803 m), weight 94.2 kg, SpO2 93 %.    Intake/Output from previous day: 09/07 0701 - 09/08 0700 In: 639.4 [P.O.:360; I.V.:279.4] Out: 0   Intake/Output this shift: No intake/output data recorded.   General appearance:  Alert to voice, confused, not agitated. Resp:  CTA Cardio: RRR +SEM at RLSB GI:  Abd Extremities: Trace edema.   Lab Results: Results for orders placed or performed during the hospital encounter of 07/29/20 (from the past 24 hour(s))  Hemoglobin and hematocrit, blood     Status: Abnormal   Collection Time: 08/01/20  6:36 PM  Result Value Ref Range   Hemoglobin 8.3 (L) 13.0 - 17.0 g/dL   HCT 10/01/20 (L) 39 - 52 %  Protime-INR     Status: Abnormal   Collection Time: 08/02/20  5:11 AM  Result Value Ref Range   Prothrombin Time 25.6 (H) 11.4 - 15.2 seconds   INR 2.4 (H) 0.8 - 1.2  Basic metabolic panel     Status: Abnormal   Collection Time: 08/02/20  5:11 AM  Result Value Ref Range   Sodium 142 135 - 145 mmol/L   Potassium 3.2 (L) 3.5 - 5.1 mmol/L   Chloride 109 98 - 111 mmol/L   CO2 27 22 - 32 mmol/L   Glucose, Bld 112 (H) 70 - 99 mg/dL   BUN 49 (H) 8 - 23 mg/dL   Creatinine,  Ser 10/02/20 0.61 - 1.24 mg/dL   Calcium 8.1 (L) 8.9 - 10.3 mg/dL   GFR calc non Af Amer >60 >60 mL/min   GFR calc Af Amer >60 >60 mL/min   Anion gap 6 5 - 15  CBC     Status: Abnormal   Collection Time: 08/02/20  5:11 AM  Result Value Ref Range   WBC 9.9 4.0 - 10.5 K/uL   RBC 2.53 (L) 4.22 - 5.81 MIL/uL   Hemoglobin 7.6 (L) 13.0 - 17.0 g/dL   HCT 10/02/20 (L) 39 - 52 %   MCV 91.3 80.0 - 100.0 fL   MCH 30.0 26.0 - 34.0 pg   MCHC 32.9 30.0 - 36.0 g/dL   RDW 91.4 (H) 78.2 - 95.6 %   Platelets 241 150 - 400 K/uL   nRBC 0.5 (H) 0.0 - 0.2 %  Heparin level (unfractionated)     Status: None   Collection Time: 08/02/20  5:11 AM  Result Value Ref Range   Heparin Unfractionated 0.38 0.30 - 0.70 IU/mL     Recent Labs    07/31/20 1427 07/31/20 1427 08/01/20 0015 08/01/20 1836 08/02/20 0511  WBC 12.2*  --  14.3*  --  9.9  HGB 8.1*   < > 8.4* 8.3* 7.6*  HCT 23.7*   < >  25.9* 25.6* 23.1*  PLT 215  --  260  --  241   < > = values in this interval not displayed.   BMET Recent Labs    07/31/20 1427 08/01/20 0015 08/02/20 0511  NA 142 143 142  K 3.4* 3.0* 3.2*  CL 107 105 109  CO2 27 27 27   GLUCOSE 138* 108* 112*  BUN 71* 69* 49*  CREATININE 1.37* 1.30* 1.08  CALCIUM 8.2* 8.3* 8.1*   LFT No results for input(s): PROT, ALBUMIN, AST, ALT, ALKPHOS, BILITOT, BILIDIR, IBILI in the last 72 hours. PT/INR Recent Labs    08/01/20 0015 08/02/20 0511  LABPROT 22.7* 25.6*  INR 2.1* 2.4*   Hepatitis Panel No results for input(s): HEPBSAG, HCVAB, HEPAIGM, HEPBIGM in the last 72 hours. C-Diff No results for input(s): CDIFFTOX in the last 72 hours. No results for input(s): CDIFFPCR in the last 72 hours.   Studies/Results: No results found.  Scheduled Inpatient Medications:   . Chlorhexidine Gluconate Cloth  6 each Topical Q0600  . collagenase   Topical Daily  . metoprolol succinate  12.5 mg Oral Daily  . potassium chloride  40 mEq Oral Q4H  . rOPINIRole  2 mg Oral QID     Continuous Inpatient Infusions:   . sodium chloride Stopped (07/31/20 2330)  . sodium chloride 20 mL/hr at 08/02/20 0300  . heparin 1,300 Units/hr (08/02/20 1229)    PRN Inpatient Medications:  ondansetron **OR** ondansetron (ZOFRAN) IV  Assessment/Plan:  86 y/o Caucasian male with a PMH of aortic stenosis s/p mechanical TAVR on Coumadin, chronic diastolic CHF, HTN, CAD, chronic venous stasis, GERD, and Hx of c diff presented to the ED09/04via EMS from SNF for respiratory distress and lethargy found to have profound anemia (Hgb 4.1)  1. Profound anemia/melena- hemoglobin 4.1on admission s/p transfusion with hemoglobin 8.3,  heme positive stool with recent melena. EGD was unrevealing. CTA negative for any active GI bleeding. Hemoglobindecreased to 7.6. He is having tarry stools.  2. Small bowel ileus - resolved, NG tube pulled.  3. Supratherapeutic INR - Warfarin restarted two days ago, then discontinued yesterday and given 2.5mg  po vit K. Today, however, INR is 2.4 and considered too high to proceed with elective colonoscopy, especially with recent melena.   4. Hx of gastric ulcer - found on endoscopy 2016  5. Hemorrhagic shock/Sepsis - receiving IVF and broad spectrum antibiotics. Improving.  6. Atrial fibrillation- Heparin infusion just stopped late, Warfarin restarted then held but INR is 2.4.  Recommendations:  -Plan for colonoscopy tomorrow with Dr. 2017. I spoke again with patient's daughter, Norma Fredrickson, who was notified of the delays of the procedure due to the factors above. She still voices agreement with plan and all of questions were answered.  -Continue to monitor H&H closely and transfuse as needed to ensure Hgb >7.0. -Repeat INR in the morning. Goal INR <2.0. -Clear liquid diet today. NPO after midnight. We will hold on administering more bowel prep for now. -Further recommendations to follow.  Alizey Noren K. Ida Rogue, M.D. 08/02/2020, 2:48 PM

## 2020-08-02 NOTE — Progress Notes (Signed)
Patient refusing to finish his bowel prep. Reeducated about the importance of the bowl prep but patient still refusing. He is confused at times. He is oriented to person and time right now, disoriented to situation and place. Tele and oxygen had been removed during the day but patient still had active tele orders so tele put back on. Currently a-fib on the monitor. SOB with movement, wearing 2L oxygen instead of his cpap tonight. Sometimes incontinent of urine and stool. Sometimes calls to use the bedpan or bedside commode. Had large incontinent bowel movement overnight, still black in color. Reeducated patient about why the bowel prep is important for his procedure. He did not want to be washed up and gave staff a hard time when trying to clean him up. Patient resting in bed and not voicing any further complaints at this time. Heparin still continues at 13units/hr. NS started at 62ml/hr per pr-op orders.

## 2020-08-02 NOTE — Progress Notes (Signed)
Pt was transported to 2C, rm 223. Daughter came to the unit  and was escorted to the new unit and was updated. Colonoscopy will be rescheduled for tomm due to INR of 2.4. New nurse was updated about Stage 1 pressure ulcer on coccyx area which was cleansed and foam dressing applied. Pt was transported with all belongings including , personal CPAP machine, eyeglasses, cellphone and hearing aid device.

## 2020-08-02 NOTE — Progress Notes (Signed)
PT Cancellation Note  Patient Details Name: [name] MRN: [id number] DOB: 04/11/1931   Cancelled Treatment:    Reason Eval/Treat Not Completed: Medical issues which prohibited therapy   Pt in room as passing by.  RN in stated awaiting MD for plan today.  Daughter in room.  Reports pt is more confused and generally restless today.  Will continue to hold and continue as appropriate.   Danielle Dess 08/02/2020, 12:00 PM

## 2020-08-02 NOTE — Progress Notes (Signed)
Pt was very confused at the beginning of the shift. Pt appeared very restless and tugging at lines. Pt was redirectable. Daughter [name] came by and reqested ot speak with the Doctor.

## 2020-08-02 NOTE — Progress Notes (Signed)
Pt needed a lot of coaxing but was able to tolerate po Potassium by mouth. Pills were cut in half. I did request for the DR to change the med from pills to powder. Pt just had an episode of urine incontinence, will change pt and then transfer to rm 233 on 2C.

## 2020-08-02 NOTE — H&P (View-Only) (Signed)
[facility] Gastroenterology Inpatient Progress Note  Subjective: Patient seen for pre-procedure evaluation. Patient did not have heparin discontinued as planned. Coumadin was previously resumed for 2 days by mistake of the primary team and INR was 2.4 today. Hgb has drifted down to 7.6. Patient refused to finish bowel prep last evening and is mildly confused today.  Objective: Vital signs in last 24 hours: Temp:  [97.8 F (36.6 C)-98.4 F (36.9 C)] 98 F (36.7 C) (09/08 1130) Pulse Rate:  [77-91] 91 (09/08 1130) Resp:  [18-20] 20 (09/08 1130) BP: (128-141)/(57-88) 140/88 (09/08 1130) SpO2:  [91 %-93 %] 93 % (09/08 1130) Weight:  [94.2 kg] 94.2 kg (09/08 0629) Blood pressure 140/88, pulse 91, temperature 98 F (36.7 C), temperature source Oral, resp. rate 20, height 5\' 11"  (1.803 m), weight 94.2 kg, SpO2 93 %.    Intake/Output from previous day: 09/07 0701 - 09/08 0700 In: 639.4 [P.O.:360; I.V.:279.4] Out: 0   Intake/Output this shift: No intake/output data recorded.   General appearance:  Alert to voice, confused, not agitated. Resp:  CTA Cardio: RRR +SEM at RLSB GI:  Abd Extremities: Trace edema.   Lab Results: Results for orders placed or performed during the hospital encounter of 07/29/20 (from the past 24 hour(s))  Hemoglobin and hematocrit, blood     Status: Abnormal   Collection Time: 08/01/20  6:36 PM  Result Value Ref Range   Hemoglobin 8.3 (L) 13.0 - 17.0 g/dL   HCT 10/01/20 (L) 39 - 52 %  Protime-INR     Status: Abnormal   Collection Time: 08/02/20  5:11 AM  Result Value Ref Range   Prothrombin Time 25.6 (H) 11.4 - 15.2 seconds   INR 2.4 (H) 0.8 - 1.2  Basic metabolic panel     Status: Abnormal   Collection Time: 08/02/20  5:11 AM  Result Value Ref Range   Sodium 142 135 - 145 mmol/L   Potassium 3.2 (L) 3.5 - 5.1 mmol/L   Chloride 109 98 - 111 mmol/L   CO2 27 22 - 32 mmol/L   Glucose, Bld 112 (H) 70 - 99 mg/dL   BUN 49 (H) 8 - 23 mg/dL   Creatinine,  Ser 10/02/20 0.61 - 1.24 mg/dL   Calcium 8.1 (L) 8.9 - 10.3 mg/dL   GFR calc non Af Amer >60 >60 mL/min   GFR calc Af Amer >60 >60 mL/min   Anion gap 6 5 - 15  CBC     Status: Abnormal   Collection Time: 08/02/20  5:11 AM  Result Value Ref Range   WBC 9.9 4.0 - 10.5 K/uL   RBC 2.53 (L) 4.22 - 5.81 MIL/uL   Hemoglobin 7.6 (L) 13.0 - 17.0 g/dL   HCT 10/02/20 (L) 39 - 52 %   MCV 91.3 80.0 - 100.0 fL   MCH 30.0 26.0 - 34.0 pg   MCHC 32.9 30.0 - 36.0 g/dL   RDW 91.4 (H) 78.2 - 95.6 %   Platelets 241 150 - 400 K/uL   nRBC 0.5 (H) 0.0 - 0.2 %  Heparin level (unfractionated)     Status: None   Collection Time: 08/02/20  5:11 AM  Result Value Ref Range   Heparin Unfractionated 0.38 0.30 - 0.70 IU/mL     Recent Labs    07/31/20 1427 07/31/20 1427 08/01/20 0015 08/01/20 1836 08/02/20 0511  WBC 12.2*  --  14.3*  --  9.9  HGB 8.1*   < > 8.4* 8.3* 7.6*  HCT 23.7*   < >  25.9* 25.6* 23.1*  PLT 215  --  260  --  241   < > = values in this interval not displayed.   BMET Recent Labs    07/31/20 1427 08/01/20 0015 08/02/20 0511  NA 142 143 142  K 3.4* 3.0* 3.2*  CL 107 105 109  CO2 27 27 27  GLUCOSE 138* 108* 112*  BUN 71* 69* 49*  CREATININE 1.37* 1.30* 1.08  CALCIUM 8.2* 8.3* 8.1*   LFT No results for input(s): PROT, ALBUMIN, AST, ALT, ALKPHOS, BILITOT, BILIDIR, IBILI in the last 72 hours. PT/INR Recent Labs    08/01/20 0015 08/02/20 0511  LABPROT 22.7* 25.6*  INR 2.1* 2.4*   Hepatitis Panel No results for input(s): HEPBSAG, HCVAB, HEPAIGM, HEPBIGM in the last 72 hours. C-Diff No results for input(s): CDIFFTOX in the last 72 hours. No results for input(s): CDIFFPCR in the last 72 hours.   Studies/Results: No results found.  Scheduled Inpatient Medications:   . Chlorhexidine Gluconate Cloth  6 each Topical Q0600  . collagenase   Topical Daily  . metoprolol succinate  12.5 mg Oral Daily  . potassium chloride  40 mEq Oral Q4H  . rOPINIRole  2 mg Oral QID     Continuous Inpatient Infusions:   . sodium chloride Stopped (07/31/20 2330)  . sodium chloride 20 mL/hr at 08/02/20 0300  . heparin 1,300 Units/hr (08/02/20 1229)    PRN Inpatient Medications:  ondansetron **OR** ondansetron (ZOFRAN) IV  Assessment/Plan:  86 y/o Caucasian male with a PMH of aortic stenosis s/p mechanical TAVR on Coumadin, chronic diastolic CHF, HTN, CAD, chronic venous stasis, GERD, and Hx of c diff presented to the ED09/04via EMS from SNF for respiratory distress and lethargy found to have profound anemia (Hgb 4.1)  1. Profound anemia/melena- hemoglobin 4.1on admission s/p transfusion with hemoglobin 8.3,  heme positive stool with recent melena. EGD was unrevealing. CTA negative for any active GI bleeding. Hemoglobindecreased to 7.6. He is having tarry stools.  2. Small bowel ileus - resolved, NG tube pulled.  3. Supratherapeutic INR - Warfarin restarted two days ago, then discontinued yesterday and given 2.5mg po vit K. Today, however, INR is 2.4 and considered too high to proceed with elective colonoscopy, especially with recent melena.   4. Hx of gastric ulcer - found on endoscopy 2016  5. Hemorrhagic shock/Sepsis - receiving IVF and broad spectrum antibiotics. Improving.  6. Atrial fibrillation- Heparin infusion just stopped late, Warfarin restarted then held but INR is 2.4.  Recommendations:  -Plan for colonoscopy tomorrow with Dr. Sawyer Kahan. I spoke again with patient's daughter, [name] Torrance, who was notified of the delays of the procedure due to the factors above. She still voices agreement with plan and all of questions were answered.  -Continue to monitor H&H closely and transfuse as needed to ensure Hgb >7.0. -Repeat INR in the morning. Goal INR <2.0. -Clear liquid diet today. NPO after midnight. We will hold on administering more bowel prep for now. -Further recommendations to follow.  Sharalyn Lomba K. Josealberto Montalto, M.D. 08/02/2020, 2:48 PM  

## 2020-08-02 NOTE — Progress Notes (Signed)
ANTICOAGULATION CONSULT NOTE  Pharmacy Consult for Heparin bridge to Warfarin Indication: aortic stenosis s/p St Jude mechanical AVR (2002)  Patient Measurements: Height: 5\' 11"  (180.3 cm) Weight: 94.2 kg (207 lb 10.8 oz) IBW/kg (Calculated) : 75.3  Heparin DW: 94.8 kg  Vital Signs: Temp: 98 F (36.7 C) (09/08 0629) Temp Source: Oral (09/08 0629) BP: 128/57 (09/08 0629) Pulse Rate: 81 (09/08 0629)  Labs: Recent Labs    07/30/20 1746 07/31/20 0226 07/31/20 1427 07/31/20 1427 08/01/20 0015 08/01/20 0015 08/01/20 0828 08/01/20 1836 08/02/20 0511  HGB   < >  --  8.1*   < > 8.4*   < >  --  8.3* 7.6*  HCT   < >  --  23.7*   < > 25.9*  --   --  25.6* 23.1*  PLT   < >  --  215  --  260  --   --   --  241  LABPROT  --  17.4*  --   --  22.7*  --   --   --  25.6*  INR  --  1.5*  --   --  2.1*  --   --   --  2.4*  HEPARINUNFRC  --   --  0.22*   < > 0.31  --  0.44  --  0.38  CREATININE   < > 1.24 1.37*  --  1.30*  --   --   --  1.08   < > = values in this interval not displayed.    Estimated Creatinine Clearance: 57.6 mL/min (by C-G formula based on SCr of 1.08 mg/dL).   Medical History: Past Medical History:  Diagnosis Date  . Anxiety 10/11  . Aortic stenosis    a. s/p mechcanical AVR, 2002; b. 10/2018 Echo: Triv AI, mean grad 11/2018.  . Bradycardia    chronic, no symptoms 07/2010  . C. difficile colitis   . Carotid bruit    dopplers in past, no abnormalities  . Chronic diastolic CHF (congestive heart failure) (HCC)    a. Echo 03/2015: EF 60-65%, no RWMA, GR1DD, mild BAE, mild to mod MR, mod TR, PASP 65 mmHg; b. 01/2017 Echo: EF 55-60%, NRWMA, grade 1 diastolic dysfunction.  Normal functioning prosthetic aortic valve.  Mean gradient 50 mmHg.  Sev TR. PASP 02/2017; c. 10/2018 Echo: EF 55-60%, Triv AI, mod dil LA, mod-sev TR, PASP 35-40, mild to mod red RV fxn.  . Coronary artery disease    a. mild, cath, 08/2010; b. medically managed  . Decreased hearing    Right ear  .  Depression   . Gastric ulcer   . GERD (gastroesophageal reflux disease)   . Hypertension    BP higher than usual 04/19/10; amlodipine increased by telephone  . Mod-Sev Tricuspid regurgitation    a. 10/2018 Echo: Mod-Sev TR, PASP 35-60mmHg.  43m RLS (restless legs syndrome) 08/23/2015  . S/P AVR    a. St. Jude. mechanical 2002; b. echo 08/2010 EF 60%, trival AI, mild MR, AVR working well; c. on longterm warfarin tx  . SOB (shortness of breath) 10/11   08/2010,Episodes at 5 AM, eventually felt to be anxiety, after complete workup including catheterization, pt greatly improved with anxiety meds 11/11    Medications:  PTA warfarin 5mg  daily  Assessment: Pharmacy has been consulted for warfarin dosing and monitoring in an 86yo male with a PMH significant for aortic stenosis s/p St. Jude mechanical AVR in 2002 (on Coumadin), chronic diastolic  HF, HTN, CAD, chronic venous stasis. Upon admission patient Hgb 4, Hct 14, and INR >10; rectal exam revealed heme positive stools. Patient received 2 units pRBC, 10mg  IV vitamin K x1, Kcentra, and 1 unit FFP. INR improved to 1.4.   Hgb has improved 4.1 >> 7.3 since admission. Continue to monitor closely for signs of re-bleeding with resumption of anticoagulation. EGD today did not identify source of bleeding. Colonoscopy is planned for 07/31/20.   Warfarin:  Date INR Plan  07/29/20 > 10 Hold warfarin  07/30/20 1.4 Warfarin 2.5 mg  07/30/20 1.5 Warfarin 2.5 mg     Goal of Therapy:  INR 2.5-3.5 (per Coumadin clinic) Heparin level 0.3 - 0.7 IU/mL Monitor platelets by anticoagulation protocol: Yes   Plan:   Heparin 09/08 @ 0500 HL 0.38 therapeutic. Will continue current rate and will recheck HL w/ am labs and continue to monitor.  11/08, PharmD, BCPS Clinical Pharmacist 08/02/2020 6:54 AM

## 2020-08-02 NOTE — Progress Notes (Signed)
PROGRESS NOTE    [name]   ZOX:[id number]  DOB: 02/05/1931  PCP: Gracelyn NurseJohnston, [name], MD    DOA: 07/29/2020 LOS: 4   Brief Narrative   [name] is a 86 y.o. M with dCHF, HTN, CAD, AS s/p St Jude AVR 2002 on coumadin, tricuspid regurg, chronic anemia, Cdiff remotely, and PUD who presented to the ED on 07/29/20 from his SNF with confusion, shortness of breath and found to be hypoxic by SNF staff.   In the ER, SpO2 < 90%, tachypneic, severely anemic with Hgb 4 g/dL, supratherapeutic INR >81>10, and rectal exam positive for blood.   Given PRBCs x2 units, PCCs, vitamin K and FFP.  Started on PPI, admitted to hospitalist service with GI consulted.     Assessment & Plan   Principal Problem:   Acute blood loss anemia Active Problems:   Depression   Aortic stenosis   History of mechanical aortic valve replacement   Chronic diastolic CHF (congestive heart failure) (HCC)   H/O atrial flutter   Lactic acidosis   Hemorrhagic shock (HCC)    Acute GI bleed Acute blood loss anemia on chronic anemia Acquired thrombophilia with supratherapeutic INR Underwent EGD 9/5 by Dr. Norma Fredricksonoledo, no ulcers or other bleeding source was found. INR > 10 at admission.   Admitted and started on PPI.   Given PCC, phytonadione and FFP at admission.  --GI following -Hold warfarin until after colonoscopy then can resume -Continue heparin bridge -Trend Hgb, transfuse for Hbg < 8 given CHF and cardiogenic shock at presentation  Colonoscopy has been delayed by mild ileus.   GI had planned for colonscopy today, 9/8, but INR over 2 and patient refused to drink full prep.   Abdominal distension, possible very mild ileus - improving S/p bowel prep 9/5 evening, and actually didn't have bowel movements for >12 hours.  Monitor abdominal exam and stool output.   History of aortic stenosis s/p St Jude aortic valve  Supratherapeutic INR - POA, INR > 10.  Target INR 2.5-3.5. Patient's INR was reversed  on admission. After EGD showed no upper source, anticoagulation was resumed with heparin bridge and patient has had no recurrent bleeding.  -Continue heparin gtt -Hold warfarin until after colonoscopy, INR goal 2.5-3.5 -Possibly Lovenox bridge after colonoscopy, will discuss with pharmacy   Acute on chronic diastolic CHF - POA Hypertension Coronary artery disease, secondary prevention On admission, CXR showed edema, pt was hypoxic 9/5.   Improved with Lasix, now stopped. -Continue metoprolol -Hold clonidine, doxazosin, Imdur   Hypokalemia - replaced.  Monitor K and Mg, replace as needed.   Acute metabolic encephalopathy/Delirium Mild, likely related to hospital environment.  Afebrile, leukocytosis resolved, was likely reactive. --Delirium precautions:     -Lights and TV off, minimize interruptions at night    -Blinds open and lights on during day    -Glasses/hearing aid with patient    -Frequent reorientation    -PT/OT when able    -Avoid sedation medications/Beers list medications    Restless leg syndrome - continue ropinirole  Gout - hold allopurinol for now   Depression - hold duloxetine  BPH - hold finasteride   Lower extremity venous stasis wounds - bilateral, POA. WOC consulted - recommends Cleanse lower legs with NS and pat dry Apply Santyl to open wounds  Cover with NS moist 2x2.  Secure with dry dressing and kerlix/tape. Change daily.   Plan to wrap with compression on discharge.   Pressure injury of sacrum, stage 2 -  present on admission  Pressure Injury 08/02/20 Sacrum Stage 2 -  Partial thickness loss of dermis presenting as a shallow open injury with a red, pink wound bed without slough. (Active)  08/02/20 1600  Location: Sacrum  Location Orientation:   Staging: Stage 2 -  Partial thickness loss of dermis presenting as a shallow open injury with a red, pink wound bed without slough.  Wound Description (Comments):   Present on Admission:       DVT prophylaxis: SCDs Start: 07/29/20 1352   Diet:  Diet Orders (From admission, onward)    Start     Ordered   08/02/20 0001  Diet NPO time specified  Diet effective midnight        08/01/20 1341            Code Status: Full Code    Subjective 08/02/20    Patient seen with daughter, [name], at bedside this AM.  Patient has been increasingly confused, agitated at times.  Pt refused to finish drinking bowel prep.  Patient having paranoid thoughts that someone trying to poison him.   Disposition Plan & Communication   Status is: Inpatient  Remain Inpatient Appropriate because severity of illness requiring frequent monitoring and ongoing diagnostic testing not appropriate for outpatient setting.  Dispo: The patient is from: SNF              Anticipated d/c is to: SNF              Anticipated d/c date is: 2-3 days              Patient currently is not medically stable for discharge.        Family Communication: daughter, [name], was at bedside during encounter today, 9/8.   Consults, Procedures, Significant Events   Consultants:   Gastroenterology  Procedures:   EGD 07/30/20   Objective   Vitals:   08/01/20 1614 08/01/20 1929 08/02/20 0629 08/02/20 0805  BP: (!) 138/59 (!) 141/66 (!) 128/57 130/67  Pulse: 77 83 81 78  Resp: 18 18 19    Temp: 97.8 F (36.6 C) 98.3 F (36.8 C) 98 F (36.7 C) 98.4 F (36.9 C)  TempSrc: Oral  Oral Oral  SpO2: 93% 91%  93%  Weight:   94.2 kg   Height:        Intake/Output Summary (Last 24 hours) at 08/02/2020 0931 Last data filed at 08/02/2020 0300 Gross per 24 hour  Intake 639.44 ml  Output 0 ml  Net 639.44 ml   Filed Weights   07/29/20 0901 08/02/20 0629  Weight: 96.3 kg 94.2 kg    Physical Exam:  General exam: confused, awake, alert, no acute distress HEENT: moist mucus membranes, hearing impaired Respiratory system: exam limited by patient mumbling - rhonchi vs referred upper airway sounds, no wheezes, normal  respiratory effort. Cardiovascular system:  exam limited by patient mumbling -normal S1/S2, RRR, no pedal edema.   Gastrointestinal system: distended but non-tender, hyperactive +bowel sounds. Central nervous system: A&Oriented only to self, no gross focal neurologic deficits, normal speech Extremities: moves all, venous stasis of LE's, dressings overlying LE wounds Psychiatry: irritable mood, congruent affect, does not appear to be hallucinating  Labs   Data Reviewed: I have personally reviewed following labs and imaging studies  CBC: Recent Labs  Lab 07/29/20 0903 07/29/20 1138 07/30/20 0506 07/30/20 1433 07/30/20 1746 07/31/20 1427 08/01/20 0015 08/01/20 1836 08/02/20 0511  WBC 10.3   < > 12.1*  --  12.6*  12.2* 14.3*  --  9.9  NEUTROABS 8.0*  --   --   --   --   --   --   --   --   HGB 4.1*   < > 5.6*   < > 7.8* 8.1* 8.4* 8.3* 7.6*  HCT 13.0*   < > 16.6*   < > 23.4* 23.7* 25.9* 25.6* 23.1*  MCV 90.9   < > 87.4  --  88.6 88.1 90.2  --  91.3  PLT 257   < > 239  --  200 215 260  --  241   < > = values in this interval not displayed.   Basic Metabolic Panel: Recent Labs  Lab 07/29/20 0903 07/29/20 0903 07/30/20 0506 07/31/20 0226 07/31/20 1427 08/01/20 0015 08/02/20 0511  NA 142   < > 145 145 142 143 142  K 3.5   < > 3.0* 3.2* 3.4* 3.0* 3.2*  CL 103   < > 108 106 107 105 109  CO2 27   < > 28 27 27 27 27   GLUCOSE 139*   < > 135* 118* 138* 108* 112*  BUN 120*   < > 99* 80* 71* 69* 49*  CREATININE 1.42*   < > 1.22 1.24 1.37* 1.30* 1.08  CALCIUM 8.2*   < > 7.9* 8.5* 8.2* 8.3* 8.1*  MG 2.6*  --  2.6*  --   --   --   --    < > = values in this interval not displayed.   GFR: Estimated Creatinine Clearance: 57.6 mL/min (by C-G formula based on SCr of 1.08 mg/dL). Liver Function Tests: Recent Labs  Lab 07/29/20 0903  AST 67*  ALT 39  ALKPHOS 160*  BILITOT 0.8  PROT 4.8*  ALBUMIN 2.5*   No results for input(s): LIPASE, AMYLASE in the last 168 hours. No results  for input(s): AMMONIA in the last 168 hours. Coagulation Profile: Recent Labs  Lab 07/30/20 0506 07/30/20 09/29/20 07/31/20 0226 08/01/20 0015 08/02/20 0511  INR 1.4* 1.4* 1.5* 2.1* 2.4*   Cardiac Enzymes: No results for input(s): CKTOTAL, CKMB, CKMBINDEX, TROPONINI in the last 168 hours. BNP (last 3 results) No results for input(s): PROBNP in the last 8760 hours. HbA1C: No results for input(s): HGBA1C in the last 72 hours. CBG: No results for input(s): GLUCAP in the last 168 hours. Lipid Profile: No results for input(s): CHOL, HDL, LDLCALC, TRIG, CHOLHDL, LDLDIRECT in the last 72 hours. Thyroid Function Tests: No results for input(s): TSH, T4TOTAL, FREET4, T3FREE, THYROIDAB in the last 72 hours. Anemia Panel: No results for input(s): VITAMINB12, FOLATE, FERRITIN, TIBC, IRON, RETICCTPCT in the last 72 hours. Sepsis Labs: Recent Labs  Lab 07/29/20 0903 07/29/20 1138  LATICACIDVEN 4.3* 2.7*    Recent Results (from the past 240 hour(s))  SARS Coronavirus 2 by RT PCR (hospital order, performed in [facility] hospital lab) Nasopharyngeal Nasopharyngeal Swab     Status: None   Collection Time: 07/29/20  9:54 AM   Specimen: Nasopharyngeal Swab  Result Value Ref Range Status   SARS Coronavirus 2 NEGATIVE NEGATIVE Final    Comment: (NOTE) SARS-CoV-2 target nucleic acids are NOT DETECTED.  The SARS-CoV-2 RNA is generally detectable in upper and lower respiratory specimens during the acute phase of infection. The lowest concentration of SARS-CoV-2 viral copies this assay can detect is 250 copies / mL. A negative result does not preclude SARS-CoV-2 infection and should not be used as the sole basis for treatment or  other patient management decisions.  A negative result may occur with improper specimen collection / handling, submission of specimen other than nasopharyngeal swab, presence of viral mutation(s) within the areas targeted by this assay, and inadequate number of viral  copies (<250 copies / mL). A negative result must be combined with clinical observations, patient history, and epidemiological information.  Fact Sheet for Patients:   BoilerBrush.com.cy  Fact Sheet for Healthcare Providers: https://pope.com/  This test is not yet approved or  cleared by the [place] FDA and has been authorized for detection and/or diagnosis of SARS-CoV-2 by FDA under an Emergency Use Authorization (EUA).  This EUA will remain in effect (meaning this test can be used) for the duration of the COVID-19 declaration under Section 564(b)(1) of the Act, 21 U.S.C. section 360bbb-3(b)(1), unless the authorization is terminated or revoked sooner.  Performed at [facility], [address] [place], [place] [address]   Blood culture (routine single)     Status: None (Preliminary result)   Collection Time: 07/29/20  9:54 AM   Specimen: BLOOD  Result Value Ref Range Status   Specimen Description BLOOD RIGHT HAND  Final   Special Requests   Final    BOTTLES DRAWN AEROBIC AND ANAEROBIC Blood Culture adequate volume   Culture   Final    NO GROWTH 3 DAYS Performed at [facility], [address] [place], [place] [address]    Report Status PENDING  Incomplete  Urine culture     Status: None   Collection Time: 07/29/20  9:57 AM   Specimen: Urine, Random  Result Value Ref Range Status   Specimen Description   Final    URINE, RANDOM Performed at [facility], [address] [place], [place] [address]    Special Requests   Final    NONE Performed at [facility], [address], [place], [place] [address]    Culture   Final    NO GROWTH Performed at [facility], [address], [place], [place] [address]    Report Status 07/31/2020 FINAL  Final  MRSA PCR Screening     Status: None   Collection Time: 07/31/20  8:25 PM   Specimen: Nasal Mucosa; Nasopharyngeal  Result  Value Ref Range Status   MRSA by PCR NEGATIVE NEGATIVE Final    Comment:        The GeneXpert MRSA Assay (FDA approved for NASAL specimens only), is one component of a comprehensive MRSA colonization surveillance program. It is not intended to diagnose MRSA infection nor to guide or monitor treatment for MRSA infections. Performed at [facility], [address], [place], [place] [address]       Imaging Studies   No results found.   Medications   Scheduled Meds: . Chlorhexidine Gluconate Cloth  6 each Topical Q0600  . collagenase   Topical Daily  . metoprolol succinate  12.5 mg Oral Daily  . rOPINIRole  2 mg Oral QID   Continuous Infusions: . sodium chloride Stopped (07/31/20 2330)  . sodium chloride 20 mL/hr at 08/02/20 0300  . heparin 1,300 Units/hr (08/02/20 0300)       LOS: 4 days    Time spent: 40 minutes    Pennie Banter, DO [facility]  08/02/2020, 9:31 AM    If 7PM-7AM, please contact night-coverage. How to contact the [facility] Attending or Consulting provider 7A - 7P or covering provider during after hours 7P -7A, for this patient?  1. Check the care team in [facility] and look for a) attending/consulting [facility] provider listed and b) the [facility] team listed 2. Log into [email or web address] and use [place]'s universal password to access. If you do not have the password, please contact the hospital operator. 3. Locate the [facility] provider you are looking for under [facility] and page to a number that you can be directly reached. 4. If you still have difficulty reaching the provider, please page the [facility] (Director on Call) for the Hospitalists listed on amion for assistance.

## 2020-08-02 NOTE — NC FL2 (Signed)
[place] MEDICAID FL2 LEVEL OF CARE SCREENING TOOL     IDENTIFICATION  Patient Name: [name] Birthdate: May 11, 1931 Sex: male Admission Date (Current Location): 07/29/2020  [facility] and [place] Number:  Chiropodist and Address:  [facility], [address], [place], [place] [address]      Provider Number: [id number]  Attending Physician Name and Address:  Pennie Banter, DO  Relative Name and Phone Number:  Prudencio Burly [phone]    Current Level of Care: Hospital Recommended Level of Care: Skilled Nursing Facility Prior Approval Number:    Date Approved/Denied:   PASRR Number: [id number]  Discharge Plan: SNF    Current Diagnoses: Patient Active Problem List   Diagnosis Date Noted   Hemorrhagic shock (HCC) 07/30/2020   Acute blood loss anemia 07/29/2020   Lactic acidosis 07/29/2020   AKI (acute kidney injury) (HCC) 07/11/2020   Acute on chronic heart failure with preserved ejection fraction (HFpEF) (HCC)    Permanent atrial fibrillation (HCC)    Cellulitis 07/10/2020   Chronic gouty arthropathy 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 04/03/2020   Varicose veins of leg with swelling, right 02/08/2020   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 difficile colitis 05/26/2015   Blood loss anemia    History of mechanical aortic valve replacement    Anemia 04/02/2015   GERD (gastroesophageal reflux disease)     Bradycardia    Depression    Anxiety    Hypertension    Decreased hearing    Coronary artery disease    Aortic stenosis    Warfarin anticoagulation    Carotid bruit     Orientation RESPIRATION BLADDER Height & Weight     Self, Time, Situation, Place  Normal Continent Weight: 94.2 kg Height:  5\' 11"  (180.3 cm)  BEHAVIORAL SYMPTOMS/MOOD NEUROLOGICAL BOWEL NUTRITION STATUS      Continent Diet (Heart Healthy)  AMBULATORY STATUS COMMUNICATION OF NEEDS Skin   Limited Assist Verbally Other (Comment)                       Personal Care Assistance Level of Assistance  Bathing, Feeding, Dressing Bathing Assistance: Limited assistance Feeding assistance: Limited assistance Dressing Assistance: Limited assistance     Functional Limitations Info  Speech, Hearing, Sight Sight Info: Adequate Hearing Info: Impaired Speech Info: Adequate    SPECIAL CARE FACTORS FREQUENCY  PT (By licensed PT), OT (By licensed OT)     PT Frequency: 5x a week OT Frequency: 5x a week            Contractures Contractures Info: Not present    Additional Factors Info  Code Status, Allergies Code Status Info: Full Allergies Info: Sulfa, Levaquin           Current Medications (08/02/2020):  This is the current hospital active medication list Current Facility-Administered Medications  Medication Dose Route Frequency Provider Last Rate Last Admin   0.9 %  sodium chloride infusion   Intravenous Continuous [place], 10/02/2020, MD  Stopped at 07/31/20 2330   0.9 %  sodium chloride infusion   Intravenous Continuous Gilda Crease, PA-C 20 mL/hr at 08/02/20 0300 Rate Verify at 08/02/20 0300   Chlorhexidine Gluconate Cloth 2 % PADS 6 each  6 each Topical Q0600 Danford, Earl Lites, MD       collagenase (SANTYL) ointment   Topical Daily Alberteen Sam, MD   Given at 08/01/20 0946   heparin ADULT infusion 100 units/mL (25000 units/273mL sodium chloride 0.45%)  1,300 Units/hr  Intravenous Continuous Lowella Bandy, RPH 13 mL/hr at 08/02/20 0300 1,300 Units/hr at 08/02/20 0300   metoprolol succinate (TOPROL-XL) 24 hr tablet 12.5 mg  12.5 mg Oral Daily Agbata, Tochukwu, MD   12.5 mg at 08/01/20 0943   ondansetron (ZOFRAN) tablet 4 mg  4 mg Oral Q6H PRN Agbata, Tochukwu, MD       Or   ondansetron (ZOFRAN) injection 4 mg  4 mg Intravenous Q6H PRN Agbata, Tochukwu, MD       rOPINIRole (REQUIP) tablet 2 mg  2 mg Oral QID Gilles Chiquito, MD   2 mg at 08/01/20 2132     Discharge Medications: Please see discharge summary for a list of discharge medications.  Relevant Imaging Results:  Relevant Lab Results:   Additional Information SS# [id number]  Shawn Route, RN

## 2020-08-02 NOTE — Progress Notes (Signed)
PT Cancellation Note  Patient Details Name: Bashar Milam MRN: [id number] DOB: [birth year]   Cancelled Treatment:    Reason Eval/Treat Not Completed: Medical issues which prohibited therapy   Chart reviewed.  HgB trending down to 7.6 this am.  Colonoscopy scheduled for today.  Will continue as appropriate.   Danielle Dess 08/02/2020, 10:29 AM

## 2020-08-02 NOTE — Hospital Course (Signed)
[name] is a 86 y.o. M with dCHF, HTN, CAD, AS s/p St Jude AVR 2002 on coumadin, tricuspid regurg, chronic anemia, Cdiff remotely, and PUD who presented to the ED on 07/29/20 from his SNF with confusion, shortness of breath and found to be hypoxic by SNF staff.   In the ER, SpO2 < 90%, tachypneic, severely anemic with Hgb 4 g/dL, supratherapeutic INR >03, and rectal exam positive for blood.   Given PRBCs x2 units, PCCs, vitamin K and FFP.  Started on PPI, admitted to hospitalist service with GI consulted.

## 2020-08-03 ENCOUNTER — Encounter
Admission: EM | Disposition: A | Discharge status: Skilled Nursing Facility | Payer: Self-pay | Source: Home / Self Care | Attending: Internal Medicine

## 2020-08-03 ENCOUNTER — Inpatient Hospital Stay: Payer: Medicare Other | Admitting: Anesthesiology

## 2020-08-03 ENCOUNTER — Encounter: Payer: Self-pay | Admitting: Family Medicine

## 2020-08-03 ENCOUNTER — Encounter
Admission: RE | Admit: 2020-08-03 | Discharge: 2020-08-03 | Disposition: A | Discharge status: Home / Self Care | Payer: Medicare Other | Source: Ambulatory Visit | Attending: Internal Medicine | Admitting: Internal Medicine

## 2020-08-03 DIAGNOSIS — Z8679 Personal history of other diseases of the circulatory system: Secondary | ICD-10-CM

## 2020-08-03 HISTORY — PX: COLONOSCOPY WITH PROPOFOL: SHX5780

## 2020-08-03 LAB — PROTIME-INR
INR: 2.1 — ABNORMAL HIGH (ref 0.8–1.2)
Prothrombin Time: 22.6 seconds — ABNORMAL HIGH (ref 11.4–15.2)

## 2020-08-03 LAB — CBC
HCT: 25.7 % — ABNORMAL LOW (ref 39.0–52.0)
Hemoglobin: 8.2 g/dL — ABNORMAL LOW (ref 13.0–17.0)
MCH: 29.7 pg (ref 26.0–34.0)
MCHC: 31.9 g/dL (ref 30.0–36.0)
MCV: 93.1 fL (ref 80.0–100.0)
Platelets: 275 10*3/uL (ref 150–400)
RBC: 2.76 MIL/uL — ABNORMAL LOW (ref 4.22–5.81)
RDW: 18.5 % — ABNORMAL HIGH (ref 11.5–15.5)
WBC: 9.9 10*3/uL (ref 4.0–10.5)
nRBC: 0.3 % — ABNORMAL HIGH (ref 0.0–0.2)

## 2020-08-03 LAB — BASIC METABOLIC PANEL
Anion gap: 11 (ref 5–15)
BUN: 36 mg/dL — ABNORMAL HIGH (ref 8–23)
CO2: 26 mmol/L (ref 22–32)
Calcium: 8.5 mg/dL — ABNORMAL LOW (ref 8.9–10.3)
Chloride: 111 mmol/L (ref 98–111)
Creatinine, Ser: 1 mg/dL (ref 0.61–1.24)
GFR calc Af Amer: >60 mL/min (ref >60)
GFR calc non Af Amer: >60 mL/min (ref >60)
Glucose, Bld: 105 mg/dL — ABNORMAL HIGH (ref 70–99)
Potassium: 3.5 mmol/L (ref 3.5–5.1)
Sodium: 148 mmol/L — ABNORMAL HIGH (ref 135–145)

## 2020-08-03 LAB — HEMOGLOBIN AND HEMATOCRIT, BLOOD
HCT: 27.2 % — ABNORMAL LOW (ref 39.0–52.0)
Hemoglobin: 8.5 g/dL — ABNORMAL LOW (ref 13.0–17.0)

## 2020-08-03 LAB — MAGNESIUM: Magnesium: 3 mg/dL — ABNORMAL HIGH (ref 1.7–2.4)

## 2020-08-03 LAB — CULTURE, BLOOD (SINGLE)
Culture: NO GROWTH
Special Requests: ADEQUATE

## 2020-08-03 LAB — HEPARIN LEVEL (UNFRACTIONATED): Heparin Unfractionated: 0.42 IU/mL (ref 0.30–0.70)

## 2020-08-03 LAB — GLUCOSE, CAPILLARY: Glucose-Capillary: 108 mg/dL — ABNORMAL HIGH (ref 70–99)

## 2020-08-03 SURGERY — COLONOSCOPY WITH PROPOFOL
Anesthesia: General

## 2020-08-03 MED ORDER — PROPOFOL 10 MG/ML IV BOLUS
INTRAVENOUS | Status: DC | PRN
  Administered 2020-08-03: 10 mg via INTRAVENOUS
  Administered 2020-08-03: 20 mg via INTRAVENOUS
  Administered 2020-08-03: 30 mg via INTRAVENOUS

## 2020-08-03 MED ORDER — MORPHINE SULFATE (PF) 2 MG/ML IV SOLN
1.0000 mg | Freq: Once | INTRAVENOUS | Status: DC
  Filled 2020-08-03 (×2): qty 1

## 2020-08-03 MED ORDER — WARFARIN SODIUM 2.5 MG PO TABS
2.5000 mg | ORAL_TABLET | Freq: Once | ORAL | Status: AC
  Administered 2020-08-03: 2.5 mg via ORAL
  Filled 2020-08-03: qty 1

## 2020-08-03 MED ORDER — PROPOFOL 500 MG/50ML IV EMUL
INTRAVENOUS | Status: DC | PRN
  Administered 2020-08-03: 50 ug/kg/min via INTRAVENOUS

## 2020-08-03 MED ORDER — TRAZODONE HCL 50 MG PO TABS
50.0000 mg | ORAL_TABLET | Freq: Every evening | ORAL | Status: DC | PRN
  Administered 2020-08-03 – 2020-08-04 (×2): 50 mg via ORAL
  Filled 2020-08-03 (×2): qty 1

## 2020-08-03 MED ORDER — SODIUM CHLORIDE 0.9 % IV SOLN: INTRAVENOUS | Status: DC

## 2020-08-03 MED ORDER — WARFARIN - PHARMACIST DOSING INPATIENT: Freq: Every day | Status: DC

## 2020-08-03 NOTE — Progress Notes (Signed)
Mobility Specialist - Progress Note   08/03/20 1225  Mobility  Activity Refused mobility (Cancelled)  Mobility performed by Mobility specialist     Pt is currently off of floor undergoing procedure. Will attempt session at a more appropriate time.    [name] Mobility Specialist 08/03/20, 12:28 PM

## 2020-08-03 NOTE — Op Note (Signed)
[facility] Gastroenterology Patient Name: [name] Procedure Date: 08/03/2020 12:05 PM MRN: [id number] Account #: [id number] Date of Birth: 1931-11-14 Admit Type: Inpatient Age: 86 Room: [facility] ENDO ROOM 2 Gender: Male Note Status: Finalized Procedure:             Colonoscopy Indications:           Melena, Acute post hemorrhagic anemia Providers:             Boykin Nearing. Norma Fredrickson MD, MD Referring MD:          Craig Guess, MD (Referring MD) Medicines:             Propofol per Anesthesia Complications:         No immediate complications. Estimated blood loss: None. Procedure:             Pre-Anesthesia Assessment:                        - The risks and benefits of the procedure and the                         sedation options and risks were discussed with the                         patient. All questions were answered and informed                         consent was obtained.                        - Patient identification and proposed procedure were                         verified prior to the procedure by the nurse. The                         procedure was verified in the procedure room.                        - ASA Grade Assessment: III - A patient with severe                         systemic disease.                        - After reviewing the risks and benefits, the patient                         was deemed in satisfactory condition to undergo the                         procedure.                        After obtaining informed consent, the colonoscope was                         passed under direct vision. Throughout the procedure,  the patient's blood pressure, pulse, and oxygen                         saturations were monitored continuously. The                         Colonoscope was introduced through the anus and                         advanced to the the terminal ileum, with                         identification of the  appendiceal orifice and IC                         valve. The colonoscopy was somewhat difficult due to                         poor endoscopic visualization. Successful completion                         of the procedure was aided by lavage. The patient                         tolerated the procedure well. The quality of the bowel                         preparation was fair. The ileocecal valve, appendiceal                         orifice, and rectum were photographed. Findings:      The perianal and digital rectal examinations were normal. Pertinent       negatives include normal sphincter tone and no palpable rectal lesions.      Non-bleeding internal hemorrhoids were found during retroflexion. The       hemorrhoids were Grade II (internal hemorrhoids that prolapse but reduce       spontaneously).      Multiple small and large-mouthed diverticula were found in the sigmoid       colon and descending colon.      A moderate amount of stool was found in the entire colon, making       visualization difficult. Lavage of the area was performed using a large       amount of sterile water, resulting in clearance with fair visualization.      Hematin (altered blood/coffee-ground-like material) was found in the       sigmoid colon.      There is no endoscopic evidence of bleeding, erythema, mass, polyps or       ulcerations in the entire colon.      The exam was otherwise without abnormality. Impression:            - Preparation of the colon was fair.                        - Non-bleeding internal hemorrhoids.                        - Diverticulosis in the sigmoid colon and in the  descending colon.                        - Stool in the entire examined colon.                        - Blood in the sigmoid colon.                        - No specimens collected. Recommendation:        - Patient has a contact number available for                         emergencies. The  signs and symptoms of potential                         delayed complications were discussed with the patient.                         Return to normal activities tomorrow. Written                         discharge instructions were provided to the patient.                        - Return patient to hospital ward for ongoing care.                        - Mechanical soft diet.                        - Continue present medications.                        - Resume Coumadin (warfarin) today and heparin today                         at prior doses. Refer to managing physician for                         further adjustment of therapy. Procedure Code(s):     --- Professional ---                        [phone], Colonoscopy, flexible; diagnostic, including                         collection of specimen(s) by brushing or washing, when                         performed (separate procedure) Diagnosis Code(s):     --- Professional ---                        K57.30, Diverticulosis of large intestine without                         perforation or abscess without bleeding                        D62, Acute posthemorrhagic anemia  K92.1, Melena (includes Hematochezia)                        K92.2, Gastrointestinal hemorrhage, unspecified                        K64.1, Second degree hemorrhoids CPT copyright 2019 [employer]. All rights reserved. The codes documented in this report are preliminary and upon coder review may  be revised to meet current compliance requirements. Stanton Kidney MD, MD 08/03/2020 12:37:47 PM This report has been signed electronically. Number of Addenda: 0 Note Initiated On: 08/03/2020 12:05 PM Scope Withdrawal Time: 0 hours 9 minutes 44 seconds  Total Procedure Duration: 0 hours 16 minutes 13 seconds  Estimated Blood Loss:  Estimated blood loss: none. Estimated blood loss: none.      [facility]

## 2020-08-03 NOTE — Progress Notes (Signed)
ANTICOAGULATION CONSULT NOTE  Pharmacy Consult for Heparin bridge to Warfarin Indication: aortic stenosis s/p St Jude mechanical AVR (2002)  Patient Measurements: Height: 5\' 11"  (180.3 cm) Weight: 92.2 kg (203 lb 4.2 oz) IBW/kg (Calculated) : 75.3  Heparin DW: 94.8 kg  Vital Signs: Temp: 98.4 F (36.9 C) (09/09 0519) Temp Source: Oral (09/09 0519) BP: 154/79 (09/09 0519) Pulse Rate: 94 (09/09 0519)  Labs: Recent Labs    08/01/20 0015 08/01/20 0015 08/01/20 10/01/20 08/01/20 1836 08/01/20 1836 08/02/20 0511 08/03/20 0449  HGB 8.4*   < >  --  8.3*   < > 7.6* 8.2*  HCT 25.9*   < >  --  25.6*  --  23.1* 25.7*  PLT 260  --   --   --   --  241 275  LABPROT 22.7*  --   --   --   --  25.6* 22.6*  INR 2.1*  --   --   --   --  2.4* 2.1*  HEPARINUNFRC 0.31   < > 0.44  --   --  0.38 0.42  CREATININE 1.30*  --   --   --   --  1.08 1.00   < > = values in this interval not displayed.    Estimated Creatinine Clearance: 61.6 mL/min (by C-G formula based on SCr of 1 mg/dL).   Medical History: Past Medical History:  Diagnosis Date  . Anxiety 10/11  . Aortic stenosis    a. s/p mechcanical AVR, 2002; b. 10/2018 Echo: Triv AI, mean grad 11/2018.  . Bradycardia    chronic, no symptoms 07/2010  . C. difficile colitis   . Carotid bruit    dopplers in past, no abnormalities  . Chronic diastolic CHF (congestive heart failure) (HCC)    a. Echo 03/2015: EF 60-65%, no RWMA, GR1DD, mild BAE, mild to mod MR, mod TR, PASP 65 mmHg; b. 01/2017 Echo: EF 55-60%, NRWMA, grade 1 diastolic dysfunction.  Normal functioning prosthetic aortic valve.  Mean gradient 50 mmHg.  Sev TR. PASP 02/2017; c. 10/2018 Echo: EF 55-60%, Triv AI, mod dil LA, mod-sev TR, PASP 35-40, mild to mod red RV fxn.  . Coronary artery disease    a. mild, cath, 08/2010; b. medically managed  . Decreased hearing    Right ear  . Depression   . Gastric ulcer   . GERD (gastroesophageal reflux disease)   . Hypertension    BP higher than  usual 04/19/10; amlodipine increased by telephone  . Mod-Sev Tricuspid regurgitation    a. 10/2018 Echo: Mod-Sev TR, PASP 35-58mmHg.  43m RLS (restless legs syndrome) 08/23/2015  . S/P AVR    a. St. Jude. mechanical 2002; b. echo 08/2010 EF 60%, trival AI, mild MR, AVR working well; c. on longterm warfarin tx  . SOB (shortness of breath) 10/11   08/2010,Episodes at 5 AM, eventually felt to be anxiety, after complete workup including catheterization, pt greatly improved with anxiety meds 11/11    Medications:  PTA warfarin 5mg  daily  Assessment: Pharmacy has been consulted for warfarin dosing and monitoring in an 86yo male with a PMH significant for aortic stenosis s/p St. Jude mechanical AVR in 2002 (on Coumadin), chronic diastolic HF, HTN, CAD, chronic venous stasis. Upon admission patient Hgb 4, Hct 14, and INR >10; rectal exam revealed heme positive stools. Patient received 2 units pRBC, 10mg  IV vitamin K x1, Kcentra, and 1 unit FFP. INR improved to 1.4.   Hgb has improved 4.1 >>  7.3 since admission. Continue to monitor closely for signs of re-bleeding with resumption of anticoagulation. EGD today did not identify source of bleeding. Colonoscopy is planned for 07/31/20.   Warfarin:  Date INR Plan  07/29/20 > 10 Hold warfarin  07/30/20 1.4 Warfarin 2.5 mg  07/30/20 1.5 Warfarin 2.5 mg     Goal of Therapy:  INR 2.5-3.5 (per Coumadin clinic) Heparin level 0.3 - 0.7 IU/mL Monitor platelets by anticoagulation protocol: Yes   Plan:   Heparin 09/09 @ 0500 HL 0.42 therapeutic. Will continue current rate and will recheck HL w/ am labs and continue to monitor.  Thomasene Ripple, PharmD, BCPS Clinical Pharmacist 08/03/2020 7:01 AM

## 2020-08-03 NOTE — Progress Notes (Signed)
ANTICOAGULATION CONSULT NOTE  Pharmacy Consult for Heparin bridge to Warfarin Indication: aortic stenosis s/p St Jude mechanical AVR (2002)  Patient Measurements: Height: 5\' 11"  (180.3 cm) Weight: 92.2 kg (203 lb 4.2 oz) IBW/kg (Calculated) : 75.3  Heparin DW: 94.8 kg  Vital Signs: Temp: 97.4 F (36.3 C) (09/09 1144) Temp Source: Temporal (09/09 1144) BP: 123/83 (09/09 1144) Pulse Rate: 94 (09/09 1144)  Labs: Recent Labs    08/01/20 0015 08/01/20 0015 08/01/20 0828 08/01/20 1836 08/01/20 1836 08/02/20 0511 08/03/20 0449  HGB 8.4*   < >  --  8.3*   < > 7.6* 8.2*  HCT 25.9*   < >  --  25.6*  --  23.1* 25.7*  PLT 260  --   --   --   --  241 275  LABPROT 22.7*  --   --   --   --  25.6* 22.6*  INR 2.1*  --   --   --   --  2.4* 2.1*  HEPARINUNFRC 0.31   < > 0.44  --   --  0.38 0.42  CREATININE 1.30*  --   --   --   --  1.08 1.00   < > = values in this interval not displayed.    Estimated Creatinine Clearance: 61.6 mL/min (by C-G formula based on SCr of 1 mg/dL).   Medical History: Past Medical History:  Diagnosis Date  . Anxiety 10/11  . Aortic stenosis    a. s/p mechcanical AVR, 2002; b. 10/2018 Echo: Triv AI, mean grad 11/2018.  . Bradycardia    chronic, no symptoms 07/2010  . C. difficile colitis   . Carotid bruit    dopplers in past, no abnormalities  . Chronic diastolic CHF (congestive heart failure) (HCC)    a. Echo 03/2015: EF 60-65%, no RWMA, GR1DD, mild BAE, mild to mod MR, mod TR, PASP 65 mmHg; b. 01/2017 Echo: EF 55-60%, NRWMA, grade 1 diastolic dysfunction.  Normal functioning prosthetic aortic valve.  Mean gradient 50 mmHg.  Sev TR. PASP 02/2017; c. 10/2018 Echo: EF 55-60%, Triv AI, mod dil LA, mod-sev TR, PASP 35-40, mild to mod red RV fxn.  . Coronary artery disease    a. mild, cath, 08/2010; b. medically managed  . Decreased hearing    Right ear  . Depression   . Gastric ulcer   . GERD (gastroesophageal reflux disease)   . Hypertension    BP higher  than usual 04/19/10; amlodipine increased by telephone  . Mod-Sev Tricuspid regurgitation    a. 10/2018 Echo: Mod-Sev TR, PASP 35-72mmHg.  43m RLS (restless legs syndrome) 08/23/2015  . S/P AVR    a. St. Jude. mechanical 2002; b. echo 08/2010 EF 60%, trival AI, mild MR, AVR working well; c. on longterm warfarin tx  . SOB (shortness of breath) 10/11   08/2010,Episodes at 5 AM, eventually felt to be anxiety, after complete workup including catheterization, pt greatly improved with anxiety meds 11/11    Medications:  PTA warfarin 5mg  daily  Assessment: Pharmacy has been consulted for warfarin dosing and monitoring in an 86yo male with a PMH significant for aortic stenosis s/p St. Jude mechanical AVR in 2002 (on Coumadin), chronic diastolic HF, HTN, CAD, chronic venous stasis. Upon admission patient Hgb 4, Hct 14, and INR >10; rectal exam revealed heme positive stools. Patient received 2 units pRBC, 10mg  IV vitamin K x1, Kcentra, and 1 unit FFP. H&H has improved since admission.  EGD today did not identify  source of bleeding. Colonoscopy was performed 08/03/20 and heparin was held during this procedure  warfarin course:  Date   INR   Dose 09/04   1.5   NONE 09/05   1.4   2.5 mg 09/06   1.5   2.5 mg 09/07   2.1   NONE  (2.5 mg vit K po) 09/08   2.4   NONE 09/09   2.1   2.5 mg  Goal of Therapy:  INR 2.5-3.5 (per Coumadin clinic) Heparin level 0.3 - 0.7 IU/mL Monitor platelets by anticoagulation protocol: Yes   Plan:   Warfarin 2.5 mg po x 1  heparin levels have been consistently therapeutic  Restart heparin at 1300 units/hr  Re-check heparin level in am  CBC, INR in am  Burnis Medin, PharmD, BCPS Clinical Pharmacist 08/03/2020 12:05 PM

## 2020-08-03 NOTE — Interval H&P Note (Signed)
History and Physical Interval Note:  08/03/2020 11:09 AM  [name]  has presented today for surgery, with the diagnosis of GI BLEED.  The various methods of treatment have been discussed with the patient and family. After consideration of risks, benefits and other options for treatment, the patient has consented to  Procedure(s): COLONOSCOPY WITH PROPOFOL (N/A) as a surgical intervention.  The patient's history has been reviewed, patient examined, no change in status, stable for surgery.  I have reviewed the patient's chart and labs.  Questions were answered to the patient's satisfaction.     [place], [place]

## 2020-08-03 NOTE — Anesthesia Procedure Notes (Signed)
Date/Time: 08/03/2020 12:21 PM Performed by: Junious Silk, CRNA Pre-anesthesia Checklist: Patient identified, Emergency Drugs available, Suction available, Patient being monitored and Timeout performed Oxygen Delivery Method: Nasal cannula

## 2020-08-03 NOTE — Progress Notes (Addendum)
PROGRESS NOTE    [name]   WUJ:[id number]  DOB: 1931/10/16  PCP: Gracelyn Nurse, MD    DOA: 07/29/2020 LOS: 5   Brief Narrative   [name] is a 86 y.o. M with dCHF, HTN, CAD, AS s/p St Jude AVR 2002 on coumadin, tricuspid regurg, chronic anemia, Cdiff remotely, and PUD who presented to the ED on 07/29/20 from his SNF with confusion, shortness of breath and found to be hypoxic by SNF staff.   In the ER, SpO2 < 90%, tachypneic, severely anemic with Hgb 4 g/dL, supratherapeutic INR >95, and rectal exam positive for blood.   Given PRBCs x2 units, PCCs, vitamin K and FFP.  Started on PPI, admitted to hospitalist service with GI consulted.     Assessment & Plan   Principal Problem:   Acute blood loss anemia Active Problems:   Depression   Aortic stenosis   History of mechanical aortic valve replacement   Chronic diastolic CHF (congestive heart failure) (HCC)   H/O atrial flutter   Lactic acidosis   Hemorrhagic shock (HCC)   Pressure injury of skin    Acute GI bleed - likely diverticular bleeding Acute blood loss anemia on chronic anemia Acquired thrombophilia with supratherapeutic INR INR > 10 at admission.  Given PCC, phytonadione and FFP at admission.  Admitted and started on PPI. EGD 9/5 by Dr. Norma Fredrickson, no ulcers or other bleeding source was found. Colonoscopy 9/9 by Dr. Norma Fredrickson - fair prep, diverticulosis on sigmoid and descending colon, blood in sigmoid colon, non-bleeding internal hemorrhoids. 9/9 - continues to have melena today per nursing --stat H&H --hold off on heparin infusion and warfarin for now   --GI following --Plan for colonoscopy today -Trend Hgb, transfuse for Hbg < 8 given CHF and cardiogenic shock at presentation   Hypernatremia - Na 148 this AM, due to NPO status for procedure.  Expect improvement with resumption of diet.  Will hold off on using D5w for now, recheck tomorrow and start fluids if not improved.   Abdominal  distension, possible very mild ileus - improving S/p bowel prep 9/5 evening, and actually didn't have bowel movements for >12 hours.  Monitor abdominal exam and stool output.   History of aortic stenosis s/p St Jude aortic valve  Supratherapeutic INR - POA, INR > 10.  Target INR 2.5-3.5. Patient's INR was reversed on admission. After EGD showed no upper source, anticoagulation was resumed with heparin bridge and patient has had no recurrent bleeding. -Hold heparin gtt for now pending H&H (ongoing melena, tachy) -Hold warfarin for now as well, INR goal 2.5-3.5 -Possibly Lovenox bridge after colonoscopy, will discuss with pharmacy   Acute on chronic diastolic CHF - POA Hypertension Coronary artery disease, secondary prevention On admission, CXR showed edema, pt was hypoxic 9/5.   Improved with Lasix, now stopped. -Continue metoprolol -Hold clonidine, doxazosin, Imdur due to low BP   Hypokalemia - replaced.  Monitor K and Mg, replace as needed.   Acute metabolic encephalopathy/Delirium Mild, likely related to hospital environment.  Afebrile, leukocytosis resolved, was likely reactive. --Delirium precautions:     -Lights and TV off, minimize interruptions at night    -Blinds open and lights on during day    -Glasses/hearing aid with patient    -Frequent reorientation    -PT/OT when able    -Avoid sedation medications/Beers list medications    Restless leg syndrome - continue ropinirole  Gout - hold allopurinol for now   Depression - hold duloxetine  BPH - hold finasteride   Lower extremity venous stasis wounds - bilateral, POA. WOC consulted - recommends Cleanse lower legs with NS and pat dry Apply Santyl to open wounds  Cover with NS moist 2x2.  Secure with dry dressing and kerlix/tape. Change daily.   Plan to wrap with compression on discharge.   Pressure injury of sacrum, stage 2 - present on admission  Pressure Injury 08/02/20 Sacrum Stage 2 -  Partial  thickness loss of dermis presenting as a shallow open injury with a red, pink wound bed without slough. (Active)  08/02/20 1600  Location: Sacrum  Location Orientation:   Staging: Stage 2 -  Partial thickness loss of dermis presenting as a shallow open injury with a red, pink wound bed without slough.  Wound Description (Comments):   Present on Admission:      DVT prophylaxis: SCDs Start: 07/29/20 1352   Diet:  Diet Orders (From admission, onward)    Start     Ordered   08/03/20 0001  Diet NPO time specified Except for: Other (See Comments)  Diet effective midnight       Comments: may have sips of water  Question:  Except for  Answer:  Other (See Comments)   08/02/20 2023            Code Status: Full Code    Subjective 08/03/20    Patient seen with daughter, [name], at bedside again this AM.  Patient continues to be agitated, confused.  Patient says he is thirsty, is NPO for colonoscopy.  He pushes me away during physical exam.   Disposition Plan & Communication   Status is: Inpatient  Remain Inpatient Appropriate because severity of illness requiring frequent monitoring and ongoing diagnostic testing not appropriate for outpatient setting.  Ongoing melena.  Dispo: The patient is from: SNF              Anticipated d/c is to: SNF              Anticipated d/c date is: 2-3 days              Patient currently is not medically stable for discharge.   Family Communication: daughter, [name], was at bedside during encounter today, 9/9.   Consults, Procedures, Significant Events   Consultants:   Gastroenterology  Procedures:   EGD 07/30/20   Objective   Vitals:   08/02/20 2031 08/03/20 0046 08/03/20 0500 08/03/20 0519  BP: 121/84 115/80  (!) 154/79  Pulse: 85 83  94  Resp: 18 16  18   Temp: 98.1 F (36.7 C) (!) 97.5 F (36.4 C)  98.4 F (36.9 C)  TempSrc: Oral Oral  Oral  SpO2: 96% 91%  98%  Weight:   92.2 kg   Height:       No intake or output data in the 24  hours ending 08/03/20 0748 Filed Weights   07/29/20 0901 08/02/20 0629 08/03/20 0500  Weight: 96.3 kg 94.2 kg 92.2 kg    Physical Exam:  General exam: confused, awake, alert, no acute distress Respiratory system: exam limited by patient refusal - overall clear, normal respiratory effort. Cardiovascular system:  exam limited by patient refusal - RRR, no pedal edema.   Gastrointestinal system: abdomen less distended today, non-tender, +bowel sounds. Central nervous system: A&Oriented only to self, no gross focal neurologic deficits, normal speech Extremities: moves all, mittens on both hands, venous stasis of LE's with distal compression wraps bilaterally Psychiatry: irritable mood, congruent affect, does not  appear to be hallucinating  Labs   Data Reviewed: I have personally reviewed following labs and imaging studies  CBC: Recent Labs  Lab 07/29/20 0903 07/29/20 1138 07/30/20 1746 07/30/20 1746 07/31/20 1427 08/01/20 0015 08/01/20 1836 08/02/20 0511 08/03/20 0449  WBC 10.3   < > 12.6*  --  12.2* 14.3*  --  9.9 9.9  NEUTROABS 8.0*  --   --   --   --   --   --   --   --   HGB 4.1*   < > 7.8*   < > 8.1* 8.4* 8.3* 7.6* 8.2*  HCT 13.0*   < > 23.4*   < > 23.7* 25.9* 25.6* 23.1* 25.7*  MCV 90.9   < > 88.6  --  88.1 90.2  --  91.3 93.1  PLT 257   < > 200  --  215 260  --  241 275   < > = values in this interval not displayed.   Basic Metabolic Panel: Recent Labs  Lab 07/29/20 0903 07/29/20 0903 07/30/20 0506 07/30/20 0506 07/31/20 0226 07/31/20 1427 08/01/20 0015 08/02/20 0511 08/03/20 0449  NA 142   < > 145   < > 145 142 143 142 148*  K 3.5   < > 3.0*   < > 3.2* 3.4* 3.0* 3.2* 3.5  CL 103   < > 108   < > 106 107 105 109 111  CO2 27   < > 28   < > 27 27 27 27 26   GLUCOSE 139*   < > 135*   < > 118* 138* 108* 112* 105*  BUN 120*   < > 99*   < > 80* 71* 69* 49* 36*  CREATININE 1.42*   < > 1.22   < > 1.24 1.37* 1.30* 1.08 1.00  CALCIUM 8.2*   < > 7.9*   < > 8.5* 8.2*  8.3* 8.1* 8.5*  MG 2.6*  --  2.6*  --   --   --   --   --  3.0*   < > = values in this interval not displayed.   GFR: Estimated Creatinine Clearance: 61.6 mL/min (by C-G formula based on SCr of 1 mg/dL). Liver Function Tests: Recent Labs  Lab 07/29/20 0903  AST 67*  ALT 39  ALKPHOS 160*  BILITOT 0.8  PROT 4.8*  ALBUMIN 2.5*   No results for input(s): LIPASE, AMYLASE in the last 168 hours. No results for input(s): AMMONIA in the last 168 hours. Coagulation Profile: Recent Labs  Lab 07/30/20 0632 07/31/20 0226 08/01/20 0015 08/02/20 0511 08/03/20 0449  INR 1.4* 1.5* 2.1* 2.4* 2.1*   Cardiac Enzymes: No results for input(s): CKTOTAL, CKMB, CKMBINDEX, TROPONINI in the last 168 hours. BNP (last 3 results) No results for input(s): PROBNP in the last 8760 hours. HbA1C: No results for input(s): HGBA1C in the last 72 hours. CBG: Recent Labs  Lab 08/02/20 1624 08/02/20 2031  GLUCAP 99 110*   Lipid Profile: No results for input(s): CHOL, HDL, LDLCALC, TRIG, CHOLHDL, LDLDIRECT in the last 72 hours. Thyroid Function Tests: No results for input(s): TSH, T4TOTAL, FREET4, T3FREE, THYROIDAB in the last 72 hours. Anemia Panel: No results for input(s): VITAMINB12, FOLATE, FERRITIN, TIBC, IRON, RETICCTPCT in the last 72 hours. Sepsis Labs: Recent Labs  Lab 07/29/20 0903 07/29/20 1138  LATICACIDVEN 4.3* 2.7*    Recent Results (from the past 240 hour(s))  SARS Coronavirus 2 by RT PCR (hospital order, performed in  [facility] hospital lab) Nasopharyngeal Nasopharyngeal Swab     Status: None   Collection Time: 07/29/20  9:54 AM   Specimen: Nasopharyngeal Swab  Result Value Ref Range Status   SARS Coronavirus 2 NEGATIVE NEGATIVE Final    Comment: (NOTE) SARS-CoV-2 target nucleic acids are NOT DETECTED.  The SARS-CoV-2 RNA is generally detectable in upper and lower respiratory specimens during the acute phase of infection. The lowest concentration of SARS-CoV-2 viral copies  this assay can detect is 250 copies / mL. A negative result does not preclude SARS-CoV-2 infection and should not be used as the sole basis for treatment or other patient management decisions.  A negative result may occur with improper specimen collection / handling, submission of specimen other than nasopharyngeal swab, presence of viral mutation(s) within the areas targeted by this assay, and inadequate number of viral copies (<250 copies / mL). A negative result must be combined with clinical observations, patient history, and epidemiological information.  Fact Sheet for Patients:   BoilerBrush.com.cyhttps://www.fda.gov/media/136312/download  Fact Sheet for Healthcare Providers: https://pope.com/https://www.fda.gov/media/136313/download  This test is not yet approved or  cleared by the [place] FDA and has been authorized for detection and/or diagnosis of SARS-CoV-2 by FDA under an Emergency Use Authorization (EUA).  This EUA will remain in effect (meaning this test can be used) for the duration of the COVID-19 declaration under Section 564(b)(1) of the Act, 21 U.S.C. section 360bbb-3(b)(1), unless the authorization is terminated or revoked sooner.  Performed at [facility], [address] [place], [place] [address]   Blood culture (routine single)     Status: None   Collection Time: 07/29/20  9:54 AM   Specimen: BLOOD  Result Value Ref Range Status   Specimen Description BLOOD RIGHT HAND  Final   Special Requests   Final    BOTTLES DRAWN AEROBIC AND ANAEROBIC Blood Culture adequate volume   Culture   Final    NO GROWTH 5 DAYS Performed at [facility], [address] [place], [place] [address]    Report Status 08/03/2020 FINAL  Final  Urine culture     Status: None   Collection Time: 07/29/20  9:57 AM   Specimen: Urine, Random  Result Value Ref Range Status   Specimen Description   Final    URINE, RANDOM Performed at [facility], [address] [place],  [place] [address]    Special Requests   Final    NONE Performed at [facility], [address] [place], [place] [address]    Culture   Final    NO GROWTH Performed at [facility], [address] [place], [place] [address]    Report Status 07/31/2020 FINAL  Final  MRSA PCR Screening     Status: None   Collection Time: 07/31/20  8:25 PM   Specimen: Nasal Mucosa; Nasopharyngeal  Result Value Ref Range Status   MRSA by PCR NEGATIVE NEGATIVE Final    Comment:        The GeneXpert MRSA Assay (FDA approved for NASAL specimens only), is one component of a comprehensive MRSA colonization surveillance program. It is not intended to diagnose MRSA infection nor to guide or monitor treatment for MRSA infections. Performed at [facility], [address] [place], [place] [address]       Imaging Studies   No results found.   Medications   Scheduled Meds: . Chlorhexidine Gluconate Cloth  6 each Topical Q0600  . collagenase  Topical Daily  . metoprolol succinate  12.5 mg Oral Daily  . rOPINIRole  2 mg Oral QID   Continuous Infusions: . sodium chloride Stopped (07/31/20 2330)  . sodium chloride 20 mL/hr at 08/02/20 0300  . heparin 1,300 Units/hr (08/02/20 1229)       LOS: 5 days    Time spent: 25 minutes    Pennie Banter, DO [facility]  08/03/2020, 7:48 AM    If 7PM-7AM, please contact night-coverage. How to contact the [facility] Attending or Consulting provider 7A - 7P or covering provider during after hours 7P -7A, for this patient?    1. Check the care team in [facility] and look for a) attending/consulting TR[facility] provider listed and b) the [facility] team listed 2. Log into [email or web address] and use [facility] universal password to access. If you do not have the password, please contact the hospital operator. 3. Locate the [facility] provider you are looking for under [facility] and page to a number that you can be directly reached. 4. If you still  have difficulty reaching the provider, please page the [facility] (Director on Call) for the Hospitalists listed on amion for assistance.

## 2020-08-03 NOTE — Progress Notes (Signed)
Physical Therapy Treatment Patient Details Name: [name] MRN: [id number] DOB: Oct 25, 1931 Today's Date: 08/03/2020    History of Present Illness [name] is a 86 y.o. male with medical history significant for aortic stenosis s/p mechanical TAVR on Coumadin, chronic diastolic heart failure, hypertension, CAD, chronic venous stasis, GERD and history of C. difficile who was sent to the ER from the skilled nursing facility for evaluation of respiratory distress and altered mental status.  Patient was said to be lethargic and had room air pulse oximetry in the low 80s.  When EMS arrived patient was placed on nonrebreather mask with improvement in his pulse oximetry to 90%.  His blood pressure was 91/38 in the field by EMS and he was able to answer questions and follow simple commands.    PT Comments    Pt at a diagonal in bed with LE's off of bed upon PT arrival; B mitts donned.  Pt talking (appearing confused) and pt also appearing impulsive with movement.  Able to follow simple commands intermittently with extra cueing and time.  OOB mobility ultimately deferred d/t safety concerns with pt's difficulty to safely follow commands.  Pt repositioned in bed and HOB elevated (pt was c/o SOB so mitt doffed, O2 reading 93% on 2 L O2 via nasal cannula, and then mitt donned again; pt then appearing more comfortable with his breathing).  Will continue to focus on strengthening and progressive functional mobility as able.    Follow Up Recommendations  SNF     Equipment Recommendations  None recommended by PT    Recommendations for Other Services       Precautions / Restrictions Precautions Precautions: Fall Precaution Comments: high fall risk Restrictions Weight Bearing Restrictions: No    Mobility  Bed Mobility Overal bed mobility: Needs Assistance Bed Mobility: Rolling Rolling: Min guard;Min assist (logrolling L and R to reposition in bed)         General bed  mobility comments: vc's for logrolling; pt unable to scoot up in bed with vc's so 2 assist provided (using bed sheets) to scoot pt up in bed to reposition  Transfers                 General transfer comment: Deferred d/t safety concerns  Ambulation/Gait                 Stairs             Wheelchair Mobility    Modified Rankin (Stroke Patients Only)       Balance                                            Cognition Arousal/Alertness: Awake/alert Behavior During Therapy: Restless;Impulsive Overall Cognitive Status: Impaired/Different from baseline                                 General Comments: Oriented to person only      Exercises      General Comments   Nursing cleared pt for participation in physical therapy.  [name] daughter [name] present during session.      Pertinent Vitals/Pain Pain Assessment: Faces Faces Pain Scale: No hurt Pain Intervention(s): Limited activity within patient's tolerance;Monitored during session;Repositioned  HR WFL during sessions activities.    Home Living  Prior Function            PT Goals (current goals can now be found in the care plan section) Acute Rehab PT Goals Patient Stated Goal: to return to SNF PT Goal Formulation: With patient/family Time For Goal Achievement: 08/15/20 Potential to Achieve Goals: Good Additional Goals Additional Goal #1: Pt will perform sit <> stand/stand pivot transfer with mod I and RW. Progress towards PT goals: Not progressing toward goals - comment (d/t confusion limiting mobility (d/t safety concerns))    Frequency    Min 2X/week      PT Plan Current plan remains appropriate    Co-evaluation              AM-PAC PT "6 Clicks" Mobility   Outcome Measure  Help needed turning from your back to your side while in a flat bed without using bedrails?: A Little Help needed moving from lying on your back  to sitting on the side of a flat bed without using bedrails?: A Little Help needed moving to and from a bed to a chair (including a wheelchair)?: A Little Help needed standing up from a chair using your arms (e.g., wheelchair or bedside chair)?: A Little Help needed to walk in hospital room?: A Lot Help needed climbing 3-5 steps with a railing? : Total 6 Click Score: 15    End of Session Equipment Utilized During Treatment: Oxygen (2 L via nasal cannula) Activity Tolerance: Other (comment) (Confusion limiting pt's ability to safely participate) Patient left: in bed;with call bell/phone within reach;with bed alarm set;with family/visitor present Nurse Communication: Mobility status;Precautions;Other (comment) (pt's c/o SOB (nurse present in room; O2 sats 93% on 2 L)) PT Visit Diagnosis: Unsteadiness on feet (R26.81);Muscle weakness (generalized) (M62.81);Other abnormalities of gait and mobility (R26.89);Difficulty in walking, not elsewhere classified (R26.2)     Time: 9024-0973 PT Time Calculation (min) (ACUTE ONLY): 20 min  Charges:  $Therapeutic Activity: 8-22 mins                    [name], PT 08/03/20, 10:46 AM

## 2020-08-03 NOTE — Anesthesia Preprocedure Evaluation (Addendum)
Anesthesia Evaluation  Patient identified by MRN, date of birth, ID band Patient awake    Reviewed: Allergy & Precautions, H&P , NPO status , reviewed documented beta blocker date and time   Airway Mallampati: III  TM Distance: >3 FB Neck ROM: full    Dental  (+) Poor Dentition, Missing   Pulmonary sleep apnea , former smoker,    Pulmonary exam normal        Cardiovascular hypertension, + CAD and +CHF  Normal cardiovascular exam  01/2020 ECHO IMPRESSIONS    1. Left ventricular ejection fraction, by estimation, is 55 to 60%. The  left ventricle has normal function. The left ventricle has no regional  wall motion abnormalities. Left ventricular diastolic function could not  be evaluated.  2. Right ventricular systolic function is normal. The right ventricular  size is mildly enlarged.  3. Left atrial size was severely dilated.  4. Right atrial size was severely dilated.  5. The mitral valve is degenerative. Mild mitral valve regurgitation.  6. Tricuspid valve regurgitation is moderate to severe.  7. The aortic valve has been repaired/replaced. Aortic valve  regurgitation is not visualized. There is a mechanical valve present in  the aortic position. Procedure Date: 2002. Echo findings are consistent  with normal structure and function of the aortic  valve prosthesis.    Neuro/Psych PSYCHIATRIC DISORDERS Anxiety Depression    GI/Hepatic PUD, GERD  ,  Endo/Other    Renal/GU Renal disease     Musculoskeletal  (+) Arthritis ,   Abdominal   Peds  Hematology  (+) Blood dyscrasia, anemia ,   Anesthesia Other Findings Past Medical History: 10/11: Anxiety No date: Aortic stenosis     Comment:  a. s/p mechcanical AVR, 2002; b. 10/2018 Echo: Triv AI,               mean grad . No date: Bradycardia     Comment:  chronic, no symptoms 07/2010 No date: C. difficile colitis No date: Carotid bruit     Comment:   dopplers in past, no abnormalities No date: Chronic diastolic CHF (congestive heart failure) (HCC)     Comment:  a. Echo 03/2015: EF 60-65%, no RWMA, GR1DD, mild BAE,               mild to mod MR, mod TR, PASP 65 mmHg; b. 01/2017 Echo: EF               55-60%, NRWMA, grade 1 diastolic dysfunction.  Normal               functioning prosthetic aortic valve.  Mean gradient 50               mmHg.  Sev TR. PASP ; c. 10/2018 Echo: EF 55-60%,               Triv AI, mod dil LA, mod-sev TR, PASP 35-40, mild to mod               red RV fxn. No date: Coronary artery disease     Comment:  a. mild, cath, 08/2010; b. medically managed No date: Decreased hearing     Comment:  Right ear No date: Depression No date: Gastric ulcer No date: GERD (gastroesophageal reflux disease) No date: Hypertension     Comment:  BP higher than usual 04/19/10; amlodipine increased by               telephone No date: Mod-Sev Tricuspid regurgitation  Comment:  a. 10/2018 Echo: Mod-Sev TR, PASP 35-63mmHg. 08/23/2015: RLS (restless legs syndrome) No date: S/P AVR     Comment:  a. St. Jude. mechanical 2002; b. echo 08/2010 EF 60%,               trival AI, mild MR, AVR working well; c. on longterm               warfarin tx 10/11: SOB (shortness of breath)     Comment:  08/2010,Episodes at 5 AM, eventually felt to be anxiety,              after complete workup including catheterization, pt               greatly improved with anxiety meds 11/11  Past Surgical History: No date: CARDIAC CATHETERIZATION 04/05/2015: ESOPHAGOGASTRODUODENOSCOPY; N/A     Comment:  Procedure: ESOPHAGOGASTRODUODENOSCOPY (EGD);  Surgeon:               Scot Jun, MD;  Location: [facility] ENDOSCOPY;                Service: Endoscopy;  Laterality: N/A; 04/17/2015: ESOPHAGOGASTRODUODENOSCOPY; N/A     Comment:  Procedure: ESOPHAGOGASTRODUODENOSCOPY (EGD);  Surgeon:               Scot Jun, MD;  Location: [facility] ENDOSCOPY;                 Service: Endoscopy;  Laterality: N/A; 08/02/2015: ESOPHAGOGASTRODUODENOSCOPY; N/A     Comment:  Procedure: ESOPHAGOGASTRODUODENOSCOPY (EGD);  Surgeon:               Wallace Cullens, MD;  Location: [facility] ENDOSCOPY;  Service:               Endoscopy;  Laterality: N/A; 07/30/2020: ESOPHAGOGASTRODUODENOSCOPY; N/A     Comment:  Procedure: ESOPHAGOGASTRODUODENOSCOPY (EGD);  Surgeon:               Toledo, Boykin Nearing, MD;  Location: [facility] ENDOSCOPY;                Service: Gastroenterology;  Laterality: N/A; No date: HERNIA REPAIR No date: JOINT REPLACEMENT No date: TOTAL HIP ARTHROPLASTY 1/02: VALVE REPLACEMENT     Comment:  Aortic; echo 3/09 valve working well; echo 10/11 working              well; put on Coumadin  BMI    Body Mass Index: 28.35 kg/m      Reproductive/Obstetrics                            Anesthesia Physical Anesthesia Plan  ASA: IV  Anesthesia Plan: General   Post-op Pain Management:    Induction: Intravenous  PONV Risk Score and Plan: Treatment may vary due to age or medical condition and TIVA  Airway Management Planned: Nasal Cannula and Natural Airway  Additional Equipment:   Intra-op Plan:   Post-operative Plan:   Informed Consent: I have reviewed the patients History and Physical, chart, labs and discussed the procedure including the risks, benefits and alternatives for the proposed anesthesia with the patient or authorized representative who has indicated his/her understanding and acceptance.     Dental Advisory Given  Plan Discussed with: CRNA  Anesthesia Plan Comments: (Pt tolerated EGD recently, expect to tolerate colonoscopy today. Discussed with daughter, who wishes to proceed)       Anesthesia Quick Evaluation

## 2020-08-03 NOTE — Transfer of Care (Signed)
Immediate Anesthesia Transfer of Care Note  Patient: [name]  Procedure(s) Performed: COLONOSCOPY WITH PROPOFOL (N/A )  Patient Location: PACU  Anesthesia Type:General  Level of Consciousness: sedated  Airway & Oxygen Therapy: Patient Spontanous Breathing and Patient connected to nasal cannula oxygen  Post-op Assessment: Report given to RN and Post -op Vital signs reviewed and stable  Post vital signs: Reviewed and stable  Last Vitals:  Vitals Value Taken Time  BP    Temp    Pulse    Resp    SpO2      Last Pain:  Vitals:   08/03/20 1144  TempSrc: Temporal  PainSc:          Complications: No complications documented.

## 2020-08-04 ENCOUNTER — Inpatient Hospital Stay: Payer: Medicare Other

## 2020-08-04 ENCOUNTER — Encounter: Payer: Self-pay | Admitting: Internal Medicine

## 2020-08-04 LAB — CBC
HCT: 26.1 % — ABNORMAL LOW (ref 39.0–52.0)
Hemoglobin: 8.4 g/dL — ABNORMAL LOW (ref 13.0–17.0)
MCH: 29 pg (ref 26.0–34.0)
MCHC: 32.2 g/dL (ref 30.0–36.0)
MCV: 90 fL (ref 80.0–100.0)
Platelets: 284 10*3/uL (ref 150–400)
RBC: 2.9 MIL/uL — ABNORMAL LOW (ref 4.22–5.81)
RDW: 18.7 % — ABNORMAL HIGH (ref 11.5–15.5)
WBC: 9.9 10*3/uL (ref 4.0–10.5)
nRBC: 0.2 % (ref 0.0–0.2)

## 2020-08-04 LAB — HEPARIN LEVEL (UNFRACTIONATED)
Heparin Unfractionated: 0.31 IU/mL (ref 0.30–0.70)
Heparin Unfractionated: 0.43 IU/mL (ref 0.30–0.70)

## 2020-08-04 LAB — BASIC METABOLIC PANEL
Anion gap: 11 (ref 5–15)
BUN: 29 mg/dL — ABNORMAL HIGH (ref 8–23)
CO2: 26 mmol/L (ref 22–32)
Calcium: 8.3 mg/dL — ABNORMAL LOW (ref 8.9–10.3)
Chloride: 112 mmol/L — ABNORMAL HIGH (ref 98–111)
Creatinine, Ser: 1.07 mg/dL (ref 0.61–1.24)
GFR calc Af Amer: >60 mL/min (ref >60)
GFR calc non Af Amer: >60 mL/min (ref >60)
Glucose, Bld: 88 mg/dL (ref 70–99)
Potassium: 3.5 mmol/L (ref 3.5–5.1)
Sodium: 149 mmol/L — ABNORMAL HIGH (ref 135–145)

## 2020-08-04 LAB — URINALYSIS, COMPLETE (UACMP) WITH MICROSCOPIC
Bilirubin Urine: NEGATIVE
Glucose, UA: NEGATIVE mg/dL
Hgb urine dipstick: NEGATIVE
Ketones, ur: 5 mg/dL — AB
Nitrite: NEGATIVE
Protein, ur: NEGATIVE mg/dL
Specific Gravity, Urine: 1.017 (ref 1.005–1.030)
pH: 5 (ref 5.0–8.0)

## 2020-08-04 LAB — PROTIME-INR
INR: 2.5 — ABNORMAL HIGH (ref 0.8–1.2)
Prothrombin Time: 25.8 seconds — ABNORMAL HIGH (ref 11.4–15.2)

## 2020-08-04 LAB — GLUCOSE, CAPILLARY
Glucose-Capillary: 138 mg/dL — ABNORMAL HIGH (ref 70–99)
Glucose-Capillary: 94 mg/dL (ref 70–99)

## 2020-08-04 LAB — BRAIN NATRIURETIC PEPTIDE: B Natriuretic Peptide: 420.8 pg/mL — ABNORMAL HIGH (ref 0.0–100.0)

## 2020-08-04 MED ORDER — THIAMINE HCL 100 MG/ML IJ SOLN: 100.0000 mg | Freq: Every day | INTRAMUSCULAR | Status: DC

## 2020-08-04 MED ORDER — WARFARIN SODIUM 1 MG PO TABS
1.0000 mg | ORAL_TABLET | Freq: Once | ORAL | Status: AC
  Administered 2020-08-04: 1 mg via ORAL
  Filled 2020-08-04 (×2): qty 1

## 2020-08-04 MED ORDER — HALOPERIDOL LACTATE 5 MG/ML IJ SOLN
2.5000 mg | Freq: Once | INTRAMUSCULAR | Status: AC
  Administered 2020-08-04: 2.5 mg via INTRAVENOUS
  Filled 2020-08-04: qty 1

## 2020-08-04 MED ORDER — FOLIC ACID 1 MG PO TABS
1.0000 mg | ORAL_TABLET | Freq: Every day | ORAL | Status: DC
  Administered 2020-08-06 – 2020-08-10 (×5): 1 mg via ORAL
  Filled 2020-08-04 (×5): qty 1

## 2020-08-04 MED ORDER — LORAZEPAM 2 MG/ML IJ SOLN
1.0000 mg | INTRAMUSCULAR | Status: DC | PRN
  Administered 2020-08-05: 1 mg via INTRAVENOUS
  Filled 2020-08-04 (×2): qty 1

## 2020-08-04 MED ORDER — HALOPERIDOL LACTATE 5 MG/ML IJ SOLN: 5.0000 mg | Freq: Once | INTRAMUSCULAR | Status: DC

## 2020-08-04 MED ORDER — LORAZEPAM 1 MG PO TABS: 1.0000 mg | ORAL_TABLET | ORAL | Status: DC | PRN

## 2020-08-04 MED ORDER — ADULT MULTIVITAMIN W/MINERALS CH
1.0000 | ORAL_TABLET | Freq: Every day | ORAL | Status: DC
  Administered 2020-08-06 – 2020-08-10 (×5): 1 via ORAL
  Filled 2020-08-04 (×5): qty 1

## 2020-08-04 MED ORDER — FUROSEMIDE 10 MG/ML IJ SOLN
40.0000 mg | Freq: Two times a day (BID) | INTRAMUSCULAR | Status: AC
  Administered 2020-08-04 – 2020-08-05 (×2): 40 mg via INTRAVENOUS
  Filled 2020-08-04 (×2): qty 4

## 2020-08-04 MED ORDER — DULOXETINE HCL 30 MG PO CPEP
30.0000 mg | ORAL_CAPSULE | Freq: Every day | ORAL | Status: DC
  Administered 2020-08-04 – 2020-08-10 (×6): 30 mg via ORAL
  Filled 2020-08-04 (×6): qty 1

## 2020-08-04 MED ORDER — POTASSIUM CHLORIDE CRYS ER 20 MEQ PO TBCR
40.0000 meq | EXTENDED_RELEASE_TABLET | Freq: Once | ORAL | Status: AC
  Administered 2020-08-04: 40 meq via ORAL
  Filled 2020-08-04: qty 2

## 2020-08-04 MED ORDER — DEXTROSE 5 % IV SOLN: INTRAVENOUS | Status: DC

## 2020-08-04 MED ORDER — THIAMINE HCL 100 MG PO TABS
100.0000 mg | ORAL_TABLET | Freq: Every day | ORAL | Status: DC
  Administered 2020-08-06 – 2020-08-10 (×5): 100 mg via ORAL
  Filled 2020-08-04 (×5): qty 1

## 2020-08-04 MED ORDER — LORAZEPAM 2 MG/ML IJ SOLN
2.0000 mg | Freq: Once | INTRAMUSCULAR | Status: AC
  Administered 2020-08-04: 2 mg via INTRAVENOUS
  Filled 2020-08-04: qty 1

## 2020-08-04 NOTE — Progress Notes (Signed)
ANTICOAGULATION CONSULT NOTE  Pharmacy Consult for Heparin bridge to Warfarin Indication: aortic stenosis s/p St Jude mechanical AVR (2002)  Patient Measurements: Height: 5\' 11"  (180.3 cm) Weight: 92.2 kg (203 lb 4.2 oz) IBW/kg (Calculated) : 75.3  Heparin DW: 94.8 kg  Vital Signs: Temp: 98.6 F (37 C) (09/10 0501) Temp Source: Oral (09/10 0501) BP: 142/69 (09/10 0501) Pulse Rate: 87 (09/10 0501)  Labs: Recent Labs    08/02/20 0511 08/02/20 0511 08/03/20 0449 08/03/20 0449 08/03/20 1454 08/04/20 0517  HGB 7.6*   < > 8.2*   < > 8.5* 8.4*  HCT 23.1*   < > 25.7*  --  27.2* 26.1*  PLT 241  --  275  --   --  284  LABPROT 25.6*  --  22.6*  --   --  25.8*  INR 2.4*  --  2.1*  --   --  2.5*  HEPARINUNFRC 0.38  --  0.42  --   --  0.31  CREATININE 1.08  --  1.00  --   --  1.07   < > = values in this interval not displayed.    Estimated Creatinine Clearance: 57.5 mL/min (by C-G formula based on SCr of 1.07 mg/dL).   Medical History: Past Medical History:  Diagnosis Date  . Anxiety 10/11  . Aortic stenosis    a. s/p mechcanical AVR, 2002; b. 10/2018 Echo: Triv AI, mean grad 11/2018.  . Bradycardia    chronic, no symptoms 07/2010  . C. difficile colitis   . Carotid bruit    dopplers in past, no abnormalities  . Chronic diastolic CHF (congestive heart failure) (HCC)    a. Echo 03/2015: EF 60-65%, no RWMA, GR1DD, mild BAE, mild to mod MR, mod TR, PASP 65 mmHg; b. 01/2017 Echo: EF 55-60%, NRWMA, grade 1 diastolic dysfunction.  Normal functioning prosthetic aortic valve.  Mean gradient 50 mmHg.  Sev TR. PASP 02/2017; c. 10/2018 Echo: EF 55-60%, Triv AI, mod dil LA, mod-sev TR, PASP 35-40, mild to mod red RV fxn.  . Coronary artery disease    a. mild, cath, 08/2010; b. medically managed  . Decreased hearing    Right ear  . Depression   . Gastric ulcer   . GERD (gastroesophageal reflux disease)   . Hypertension    BP higher than usual 04/19/10; amlodipine increased by telephone  .  Mod-Sev Tricuspid regurgitation    a. 10/2018 Echo: Mod-Sev TR, PASP 35-72mmHg.  43m RLS (restless legs syndrome) 08/23/2015  . S/P AVR    a. St. Jude. mechanical 2002; b. echo 08/2010 EF 60%, trival AI, mild MR, AVR working well; c. on longterm warfarin tx  . SOB (shortness of breath) 10/11   08/2010,Episodes at 5 AM, eventually felt to be anxiety, after complete workup including catheterization, pt greatly improved with anxiety meds 11/11    Medications:  PTA warfarin 5mg  daily  Assessment: Pharmacy has been consulted for warfarin dosing and monitoring in an 86yo male with a PMH significant for aortic stenosis s/p St. Jude mechanical AVR in 2002 (on Coumadin), chronic diastolic HF, HTN, CAD, chronic venous stasis. Upon admission patient Hgb 4, Hct 14, and INR >10; rectal exam revealed heme positive stools. Patient received 2 units pRBC, 10mg  IV vitamin K x1, Kcentra, and 1 unit FFP. H&H has improved since admission.  EGD today did not identify source of bleeding. Colonoscopy was performed 08/03/20 and heparin was held during this procedure  warfarin course:  Date   INR  Dose 09/04   1.5   NONE 09/05   1.4   2.5 mg 09/06   1.5   2.5 mg 09/07   2.1   NONE  (2.5 mg vit K po) 09/08   2.4   NONE 09/09   2.1   2.5 mg  Goal of Therapy:  INR 2.5-3.5 (per Coumadin clinic) Heparin level 0.3 - 0.7 IU/mL Monitor platelets by anticoagulation protocol: Yes   Plan:  09/10 @ 0500 HL 0.31 therapeutic, but has been trending down. Will increase rate slightly to 1350 units/hr and will recheck HL at 1500 and continue to monitor.  Thomasene Ripple, PharmD, BCPS Clinical Pharmacist 08/04/2020 7:02 AM

## 2020-08-04 NOTE — Progress Notes (Signed)
Physical Therapy Treatment Patient Details Name: [name] MRN: [id number] DOB: 1931-03-29 Today's Date: 08/04/2020    History of Present Illness [name] is a 86 y.o. male with medical history significant for aortic stenosis s/p mechanical TAVR on Coumadin, chronic diastolic heart failure, hypertension, CAD, chronic venous stasis, GERD and history of C. difficile who was sent to the ER from the skilled nursing facility for evaluation of respiratory distress and altered mental status.  Patient was said to be lethargic and had room air pulse oximetry in the low 80s.  When EMS arrived patient was placed on nonrebreather mask with improvement in his pulse oximetry to 90%.  His blood pressure was 91/38 in the field by EMS and he was able to answer questions and follow simple commands.    PT Comments    Pt resting in bed upon PT arrival; pt's son present.  Pt able to stand from bed up to RW x2 trials with min assist and then take small steps in place (x10 reps B LE's each trial) with UE support on RW and CGA to min assist x2.  Sitting rest break provided between trials.  Pt requiring extra cueing and time to sit down after each activity (pt appearing confused and impulsive during session requiring extra time and assist for safety).  While attempting to have pt sit down after 2nd trial, pt noted to be SOB with HR elevated to 120 bpm and then suddenly HR increased to 140 and then to 150 bpm but then therapist was able to get pt sitting down and HR quickly decreased down to low 100's and then down to around 94 bpm at rest.  Wheezing noted when pt layed down in bed with increased work of breathing; pt's O2 noted to be 99% on room air and after a few minutes of rest in bed, wheezing resolved and improved comfort of breathing noted (nurse notified immediately and reported pt had been wheezing earlier today).  Nurse notified of pt's elevated HR, SOB, wheezing, increased work of breathing, and  pt's mobility during session.  Will continue to focus on strengthening and progressive functional mobility per pt tolerance.   Follow Up Recommendations  SNF     Equipment Recommendations  None recommended by PT    Recommendations for Other Services       Precautions / Restrictions Precautions Precautions: Fall Precaution Comments: high fall risk Restrictions Weight Bearing Restrictions: No    Mobility  Bed Mobility Overal bed mobility: Needs Assistance Bed Mobility: Supine to Sit;Sit to Supine     Supine to sit: Mod assist;Max assist;HOB elevated Sit to supine: +2 for physical assistance (2 assist for safety sit to semi-supine in bed)   General bed mobility comments: assist for LE's and trunk  Transfers Overall transfer level: Needs assistance Equipment used: Rolling walker (2 wheeled) Transfers: Sit to/from Stand Sit to Stand: Min assist;From elevated surface         General transfer comment: x2 trials standing; vc's for UE placement; increased cueing required for pt to sit down onto edge of bed x2 trials  Ambulation/Gait Ambulation/Gait assistance: Min guard;Min assist;+2 physical assistance Gait Distance (Feet):  (pt able to take small steps in place x10 reps B LE's (x2 trials)) Assistive device: Rolling walker (2 wheeled)   Gait velocity: decreased   General Gait Details: decreased B LE foot clearance   Stairs             Wheelchair Mobility    Modified  Rankin (Stroke Patients Only)       Balance Overall balance assessment: Needs assistance Sitting-balance support: Feet supported;Single extremity supported Sitting balance-Leahy Scale: Poor Sitting balance - Comments: pt requiring at least single UE support for static sitting balance   Standing balance support: Bilateral upper extremity supported Standing balance-Leahy Scale: Fair Standing balance comment: pt requiring B UE support on RW for standing balance                             Cognition Arousal/Alertness: Awake/alert Behavior During Therapy: Restless;Impulsive Overall Cognitive Status: Impaired/Different from baseline                                 General Comments: Oriented to person only.  Reports he is in [place].      Exercises      General Comments   Nursing cleared pt for participation in physical therapy.  Pt agreeable to PT session.      Pertinent Vitals/Pain Pain Assessment: Faces Faces Pain Scale: No hurt Pain Intervention(s): Limited activity within patient's tolerance;Monitored during session;Repositioned    Home Living                      Prior Function            PT Goals (current goals can now be found in the care plan section) Acute Rehab PT Goals Patient Stated Goal: to return to SNF PT Goal Formulation: With patient/family Time For Goal Achievement: 08/15/20 Potential to Achieve Goals: Good Additional Goals Additional Goal #1: Pt will perform sit <> stand/stand pivot transfer with mod I and RW. Progress towards PT goals: Progressing toward goals    Frequency    Min 2X/week      PT Plan Current plan remains appropriate    Co-evaluation              AM-PAC PT "6 Clicks" Mobility   Outcome Measure  Help needed turning from your back to your side while in a flat bed without using bedrails?: A Little Help needed moving from lying on your back to sitting on the side of a flat bed without using bedrails?: A Lot Help needed moving to and from a bed to a chair (including a wheelchair)?: A Lot Help needed standing up from a chair using your arms (e.g., wheelchair or bedside chair)?: A Little Help needed to walk in hospital room?: A Lot Help needed climbing 3-5 steps with a railing? : Total 6 Click Score: 13    End of Session Equipment Utilized During Treatment: Gait belt Activity Tolerance: Other (comment) (limited d/t SOB and increased HR with activity) Patient left: in  bed;with call bell/phone within reach;with bed alarm set;with family/visitor present;Other (comment) (bed in lowest position; B fall mats in place) Nurse Communication: Mobility status;Precautions;Other (comment) (pt's elevated HR and wheezing) PT Visit Diagnosis: Unsteadiness on feet (R26.81);Muscle weakness (generalized) (M62.81);Other abnormalities of gait and mobility (R26.89);Difficulty in walking, not elsewhere classified (R26.2)     Time: 1025-8527 PT Time Calculation (min) (ACUTE ONLY): 38 min  Charges:  $Gait Training: 8-22 mins $Therapeutic Activity: 23-37 mins                    [name], PT 08/04/20, 4:58 PM

## 2020-08-04 NOTE — Progress Notes (Signed)
Cross Cover Note Nurse reports patient with increased agitation confusion and intermittent wheezing.  BNP noted to be elevated and + fluid balance Chest xray with findings of CHF. Lasix ordered Strict I & O  Daily weight

## 2020-08-04 NOTE — Progress Notes (Signed)
Mobility Specialist - Progress Note   08/04/20 1200  Mobility  Activity Sat and stood x 3  Level of Assistance Moderate assist, patient does 50-74%  Assistive Device Front wheel walker  Distance Ambulated (ft) 0 ft  Mobility Response Tolerated well  Mobility performed by Mobility specialist  $Mobility charge 1 Mobility    Pre-mobility: 85 HR, 124/65 BP, 98% SpO2 During mobility: 95 HR, 95% SpO2 Post-mobility: 96 HR, 148/94 BP, 97% SpO2   Pt was lying in bed upon arrival with son present in room. Pt agreed to session. Pt was AOx2 to identity and location. Pt appeared confused and presented difficulty staying focused this session. Pt was able to get to EOB with minA and performed sit-to-stand x2 with modA. Heavy breathing was noted, O2 desat to 95%. Verbal cues was needed to remind pt to stand/sit slowly and controlled. Pt had an incontinent episode while in bed. Mobility tech assisted in hygiene. Pt would constantly ask "when are we leaving", mobility reassured pt that the decision would ultimately be made upon doctor's approval. Pt seemed unaware that he was the pt, as he would constantly state that he was "waiting for his wife to leave the hospital so they could go home." Pt believed that his wife was in a room down the hall, but pt's son reminded him that this wife was at home. Overall, pt tolerated session well. Pt was left in bed with no needs requested. Nurse was notified of performance.    Filiberto Pinks Mobility Specialist 08/04/20, 12:08 PM

## 2020-08-04 NOTE — TOC Initial Note (Addendum)
Transition of Care [facility]) - Initial/Assessment Note    Patient Details  Name: [name] MRN: [id number] Date of Birth: 1931-08-28  Transition of Care [facility]) CM/SW Contact:    Chapman Fitch, RN Phone Number: 08/04/2020, 3:07 PM  Clinical Narrative:                 Patient admitted from [facility].  Prior to being at the SNF side he lived in an apartment at [facility].   Patient disoriented.  Assessment completed with daughter.  At baseline patient A&O.  Patient ambulates with RW.  PCP Letitia Libra.  Normally daughter provides transportation.  Denies issues obtaining medications.    PT has worked with patient and recommends SNF.   Per insurance Berkley Harvey will be good through tomorrow, if he discharges after tomorrow Berkley Harvey will have to be restarted  Per [name] at [facility] they can accept over the weekend if patient is medically ready,  But will have to have orders by 11 am on Saturday due to their pharmacy closing   Expected Discharge Plan: Skilled Nursing Facility Barriers to Discharge: Continued Medical Work up   Patient Goals and CMS Choice        Expected Discharge Plan and Services Expected Discharge Plan: Skilled Nursing Facility   Discharge Planning Services: CM Consult   Living arrangements for the past 2 months: Skilled Nursing Facility                                      Prior Living Arrangements/Services Living arrangements for the past 2 months: Skilled Nursing Facility Lives with:: Facility Resident Patient language and need for interpreter reviewed:: Yes Do you feel safe going back to the place where you live?: Yes      Need for Family Participation in Patient Care: Yes (Comment) Care giver support system in place?: Yes (comment)   Criminal Activity/Legal Involvement Pertinent to Current Situation/Hospitalization: No - Comment as needed  Activities of Daily Living      Permission Sought/Granted                  Emotional  Assessment              Admission diagnosis:  Hemorrhagic shock (HCC) [R57.8] Acute blood loss anemia [D62] Lactic acid acidosis [E87.2] Troponin I above reference range [R77.8] Supratherapeutic INR [R79.1] Low hemoglobin [D64.9] H/O heart valve replacement with mechanical valve [Z95.2] Gastrointestinal hemorrhage, unspecified gastrointestinal hemorrhage type [K92.2] Atrial fibrillation, unspecified type (HCC) [I48.91] At risk for injury associated with anticoagulation [Z91.89] Patient Active Problem List   Diagnosis Date Noted  . Pressure injury of skin 08/02/2020  . Hemorrhagic shock (HCC) 07/30/2020  . Acute blood loss anemia 07/29/2020  . Lactic acidosis 07/29/2020  . AKI (acute kidney injury) (HCC) 07/11/2020  . Acute on chronic heart failure with preserved ejection fraction (HFpEF) (HCC)   . Permanent atrial fibrillation (HCC)   . Cellulitis 07/10/2020  . 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 Varicose veins of leg with swelling, right 02/08/2020  . 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 Greater trochanteric pain syndrome 11/02/2019  . Lower limb ulcer, ankle, left, limited to breakdown of skin (HCC) 11/02/2019  . H/O atrial flutter 07/15/2018  . Obstructive sleep apnea 10/23/2017  . Chronic diastolic CHF (congestive heart failure) (HCC) 09/24/2017  . Lymphedema 09/24/2017  .  Chronic hyponatremia 08/12/2017  . Encounter for anticoagulation discussion and counseling 02/24/2017  . Bilateral leg edema 09/25/2015  . RLS (restless legs syndrome) 08/23/2015  . C. difficile colitis 05/26/2015  . Blood loss anemia   . History of mechanical aortic valve replacement   . Anemia 04/02/2015  . GERD (gastroesophageal reflux disease)   . Bradycardia   . Depression   . Anxiety   . Hypertension   . Decreased hearing   . Coronary artery disease   . Aortic stenosis   . Warfarin anticoagulation   . Carotid bruit    PCP:   Gracelyn Nurse, MD Pharmacy:   [facility] [phone] [place] - [address] [place] [address] Phone: [phone] Fax: [phone]  [id number] [place], [place] - [address] [place] [address] Phone: [phone] Fax: [phone]  [facility] [place], [place] - [address] [place], [address] [place], [address] [place] [address] Phone: [phone] Fax: [phone]     Social Determinants of Health (SDOH) Interventions    Readmission Risk Interventions Readmission Risk Prevention Plan 08/04/2020 07/12/2020  Transportation Screening Complete Complete  PCP or Specialist Appt within 3-5 Days - Complete  HRI or Home Care Consult (No Data) -  Social Work Consult for Recovery Care Planning/Counseling Complete Complete  Palliative Care Screening Not Applicable Not Applicable  Medication Review Oceanographer) Complete Complete  Some recent data might be hidden

## 2020-08-04 NOTE — Progress Notes (Signed)
PROGRESS NOTE    [name]   RCV:[id number]  DOB: [birth year]  PCP: Gracelyn Nurse, MD    DOA: 07/29/2020 LOS: 6   Brief Narrative   [name] is a 86 y.o. M with dCHF, HTN, CAD, AS s/p St Jude AVR 2002 on coumadin, tricuspid regurg, chronic anemia, Cdiff remotely, and PUD who presented to the ED on 07/29/20 from his SNF with confusion, shortness of breath and found to be hypoxic by SNF staff.   In the ER, SpO2 < 90%, tachypneic, severely anemic with Hgb 4 g/dL, supratherapeutic INR >10, and rectal exam positive for blood.   Given PRBCs x2 units, PCCs, vitamin K and FFP.  Started on PPI, admitted to hospitalist service with GI consulted.     Assessment & Plan   Principal Problem:   Acute blood loss anemia Active Problems:   Depression   Aortic stenosis   History of mechanical aortic valve replacement   Chronic diastolic CHF (congestive heart failure) (HCC)   H/O atrial flutter   Lactic acidosis   Hemorrhagic shock (HCC)   Pressure injury of skin    Acute GI bleed - likely diverticular bleeding Acute blood loss anemia on chronic anemia Acquired thrombophilia with supratherapeutic INR INR > 10 at admission.  Given PCC, phytonadione and FFP at admission.  Admitted and started on PPI. EGD 9/5 by Dr. Norma Fredrickson, no ulcers or other bleeding source was found. Colonoscopy 9/9 by Dr. Norma Fredrickson - fair prep, diverticulosis on sigmoid and descending colon, blood in sigmoid colon, non-bleeding internal hemorrhoids. 9/9 - ongoing melena today per nursing --stat H&H --resumed on heparin gtt >> bridge warfarin  --GI following -Trend Hgb, transfuse for Hbg < 8 given CHF and cardiogenic shock at presentation   Hypernatremia - Na 148>>149 this AM, due to NPO status for procedure, also poor PO intake likely due to confusion/delirium.  Expect improvement with resumption of diet, but pt's having poor oral intake seemingly due to his confusion. --Will run gentle D5W @ 75 cc/hr x 1  day --BMP in AM --Monitor volume status, Lasix IV if needed and BP can tolerate   Abdominal distension, possible very mild ileus - Resolved. S/p bowel prep 9/5 evening, and actually didn't have bowel movements for >12 hours.   Monitor abdominal exam and stool output.   History of aortic stenosis s/p St Jude aortic valve  Supratherapeutic INR - POA, INR > 10.  Target INR 2.5-3.5.  Patient's INR was reversed on admission. -Resumed heparin gtt after colonoscopy -Resumed warfarin, INR goal 2.5-3.5 --Pharmacy following   Acute on chronic diastolic CHF - POA Hypertension Coronary artery disease, secondary prevention On admission, CXR showed edema, pt was hypoxic 9/5.   Improved with Lasix, now stopped. -Continue metoprolol -Hold clonidine, doxazosin, Imdur due to low BP --Home torsemide and metolazone on hold due to BP as well --Monitor volume status, esp with d5w running --Would give 40 IV Lasix if short of breath or worsening edema   Hypokalemia - replaced.  Monitor K and Mg, replace as needed.   Acute metabolic encephalopathy / Delirium Mild, likely related to hospital environment.  Afebrile, leukocytosis resolved, was likely reactive. --Check a UA to rule out UTI --CXR was negative for PNA few days ago --Delirium precautions:     -Lights and TV off, minimize interruptions at night    -Blinds open and lights on during day    -Glasses/hearing aid with patient    -Frequent reorientation    -PT/OT when able    -  Avoid sedation medications/Beers list medications    Restless leg syndrome - continue ropinirole  Gout - hold allopurinol for now   Depression - hold duloxetine  BPH - hold finasteride   Lower extremity venous stasis wounds - bilateral, POA. WOC consulted - recommends Cleanse lower legs with NS and pat dry Apply Santyl to open wounds  Cover with NS moist 2x2.  Secure with dry dressing and kerlix/tape. Change daily.   Plan to wrap with compression on  discharge.   Pressure injury of sacrum, stage 2 - present on admission  Pressure Injury 08/02/20 Sacrum Stage 2 -  Partial thickness loss of dermis presenting as a shallow open injury with a red, pink wound bed without slough. (Active)  08/02/20 1600  Location: Sacrum  Location Orientation:   Staging: Stage 2 -  Partial thickness loss of dermis presenting as a shallow open injury with a red, pink wound bed without slough.  Wound Description (Comments):   Present on Admission:      DVT prophylaxis: SCDs Start: 07/29/20 1352   Diet:  Diet Orders (From admission, onward)    Start     Ordered   08/03/20 1400  DIET SOFT Room service appropriate? Yes; Fluid consistency: Thin  Diet effective now       Question Answer Comment  Room service appropriate? Yes   Fluid consistency: Thin      08/03/20 1359            Code Status: Full Code    Subjective 08/04/20    Patient seen with son at bedside today.  Patient working with PT.  He remains confused, disoriented to place, time, circumstances.  Asks how to get out of here.  No acute complaints.   Disposition Plan & Communication   Status is: Inpatient  Remain Inpatient Appropriate because severity of illness requiring IV medications, awaiting therapeutic INR x 48 hours  Dispo: The patient is from: SNF              Anticipated d/c is to: SNF              Anticipated d/c date is: 2 days              Patient currently is not medically stable for discharge.   Family Communication: son was at bedside during encounter today, 9/10   Consults, Procedures, Significant Events   Consultants:   Gastroenterology  Procedures:   EGD 07/30/20   Objective   Vitals:   08/03/20 1306 08/03/20 1405 08/03/20 2038 08/04/20 0501  BP:  122/66 137/87 (!) 142/69  Pulse: (!) 102 91 80 87  Resp: (!) 21   20  Temp:   97.9 F (36.6 C) 98.6 F (37 C)  TempSrc:   Oral Oral  SpO2: 98%  100% 100%  Weight:      Height:         Intake/Output Summary (Last 24 hours) at 08/04/2020 0748 Last data filed at 08/04/2020 0500 Gross per 24 hour  Intake 1264.94 ml  Output 300 ml  Net 964.94 ml   Filed Weights   08/02/20 0629 08/03/20 0500 08/03/20 1144  Weight: 94.2 kg 92.2 kg 92.2 kg    Physical Exam:  General exam: confused, awake, alert, no acute distress Respiratory system: clear with diminished bases, no wheezes or crackles appreciated on exam limited by patient talking, normal respiratory effort.   Cardiovascular system:  exam limited by patient talking - RRR, no pedal edema.   Central  nervous system: A&Oriented only to self, no gross focal neurologic deficits, normal speech Extremities: moves all, venous stasis of LE's with distal compression wraps bilaterally   Labs   Data Reviewed: I have personally reviewed following labs and imaging studies  CBC: Recent Labs  Lab 07/29/20 0903 07/29/20 1138 07/31/20 1427 07/31/20 1427 08/01/20 0015 08/01/20 0015 08/01/20 1836 08/02/20 0511 08/03/20 0449 08/03/20 1454 08/04/20 0517  WBC 10.3   < > 12.2*  --  14.3*  --   --  9.9 9.9  --  9.9  NEUTROABS 8.0*  --   --   --   --   --   --   --   --   --   --   HGB 4.1*   < > 8.1*   < > 8.4*   < > 8.3* 7.6* 8.2* 8.5* 8.4*  HCT 13.0*   < > 23.7*   < > 25.9*   < > 25.6* 23.1* 25.7* 27.2* 26.1*  MCV 90.9   < > 88.1  --  90.2  --   --  91.3 93.1  --  90.0  PLT 257   < > 215  --  260  --   --  241 275  --  284   < > = values in this interval not displayed.   Basic Metabolic Panel: Recent Labs  Lab 07/29/20 0903 07/29/20 0903 07/30/20 0506 07/31/20 0226 07/31/20 1427 08/01/20 0015 08/02/20 0511 08/03/20 0449 08/04/20 0517  NA 142   < > 145   < > 142 143 142 148* 149*  K 3.5   < > 3.0*   < > 3.4* 3.0* 3.2* 3.5 3.5  CL 103   < > 108   < > 107 105 109 111 112*  CO2 27   < > 28   < > 27 27 27 26 26   GLUCOSE 139*   < > 135*   < > 138* 108* 112* 105* 88  BUN 120*   < > 99*   < > 71* 69* 49* 36* 29*   CREATININE 1.42*   < > 1.22   < > 1.37* 1.30* 1.08 1.00 1.07  CALCIUM 8.2*   < > 7.9*   < > 8.2* 8.3* 8.1* 8.5* 8.3*  MG 2.6*  --  2.6*  --   --   --   --  3.0*  --    < > = values in this interval not displayed.   GFR: Estimated Creatinine Clearance: 57.5 mL/min (by C-G formula based on SCr of 1.07 mg/dL). Liver Function Tests: Recent Labs  Lab 07/29/20 0903  AST 67*  ALT 39  ALKPHOS 160*  BILITOT 0.8  PROT 4.8*  ALBUMIN 2.5*   No results for input(s): LIPASE, AMYLASE in the last 168 hours. No results for input(s): AMMONIA in the last 168 hours. Coagulation Profile: Recent Labs  Lab 07/31/20 0226 08/01/20 0015 08/02/20 0511 08/03/20 0449 08/04/20 0517  INR 1.5* 2.1* 2.4* 2.1* 2.5*   Cardiac Enzymes: No results for input(s): CKTOTAL, CKMB, CKMBINDEX, TROPONINI in the last 168 hours. BNP (last 3 results) No results for input(s): PROBNP in the last 8760 hours. HbA1C: No results for input(s): HGBA1C in the last 72 hours. CBG: Recent Labs  Lab 08/02/20 1624 08/02/20 2031 08/03/20 2040 08/04/20 0730  GLUCAP 99 110* 108* 94   Lipid Profile: No results for input(s): CHOL, HDL, LDLCALC, TRIG, CHOLHDL, LDLDIRECT in the last 72 hours. Thyroid Function Tests:  No results for input(s): TSH, T4TOTAL, FREET4, T3FREE, THYROIDAB in the last 72 hours. Anemia Panel: No results for input(s): VITAMINB12, FOLATE, FERRITIN, TIBC, IRON, RETICCTPCT in the last 72 hours. Sepsis Labs: Recent Labs  Lab 07/29/20 0903 07/29/20 1138  LATICACIDVEN 4.3* 2.7*    Recent Results (from the past 240 hour(s))  SARS Coronavirus 2 by RT PCR (hospital order, performed in [facility] hospital lab) Nasopharyngeal Nasopharyngeal Swab     Status: None   Collection Time: 07/29/20  9:54 AM   Specimen: Nasopharyngeal Swab  Result Value Ref Range Status   SARS Coronavirus 2 NEGATIVE NEGATIVE Final    Comment: (NOTE) SARS-CoV-2 target nucleic acids are NOT DETECTED.  The SARS-CoV-2 RNA is  generally detectable in upper and lower respiratory specimens during the acute phase of infection. The lowest concentration of SARS-CoV-2 viral copies this assay can detect is 250 copies / mL. A negative result does not preclude SARS-CoV-2 infection and should not be used as the sole basis for treatment or other patient management decisions.  A negative result may occur with improper specimen collection / handling, submission of specimen other than nasopharyngeal swab, presence of viral mutation(s) within the areas targeted by this assay, and inadequate number of viral copies (<250 copies / mL). A negative result must be combined with clinical observations, patient history, and epidemiological information.  Fact Sheet for Patients:   BoilerBrush.com.cy  Fact Sheet for Healthcare Providers: https://pope.com/  This test is not yet approved or  cleared by the [place] FDA and has been authorized for detection and/or diagnosis of SARS-CoV-2 by FDA under an Emergency Use Authorization (EUA).  This EUA will remain in effect (meaning this test can be used) for the duration of the COVID-19 declaration under Section 564(b)(1) of the Act, 21 U.S.C. section 360bbb-3(b)(1), unless the authorization is terminated or revoked sooner.  Performed at [facility], [address] [place], [place] [address]   Blood culture (routine single)     Status: None   Collection Time: 07/29/20  9:54 AM   Specimen: BLOOD  Result Value Ref Range Status   Specimen Description BLOOD RIGHT HAND  Final   Special Requests   Final    BOTTLES DRAWN AEROBIC AND ANAEROBIC Blood Culture adequate volume   Culture   Final    NO GROWTH 5 DAYS Performed at [facility], [address], [place], [place] [address]    Report Status 08/03/2020 FINAL  Final  Urine culture     Status: None   Collection Time: 07/29/20  9:57 AM   Specimen: Urine, Random   Result Value Ref Range Status   Specimen Description   Final    URINE, RANDOM Performed at [facility], [address] [place], [place] [address]    Special Requests   Final    NONE Performed at [facility], [address] [place], [place] [address]    Culture   Final    NO GROWTH Performed at [facility], [address] [place], [place] [address]    Report Status 07/31/2020 FINAL  Final  MRSA PCR Screening     Status: None   Collection Time: 07/31/20  8:25 PM   Specimen: Nasal Mucosa; Nasopharyngeal  Result Value Ref Range Status   MRSA by PCR NEGATIVE NEGATIVE Final    Comment:        The GeneXpert MRSA Assay (FDA approved for NASAL specimens only), is one component of a comprehensive MRSA  colonization surveillance program. It is not intended to diagnose MRSA infection nor to guide or monitor treatment for MRSA infections. Performed at [facility], [address], [place], [place] [address]       Imaging Studies   No results found.   Medications   Scheduled Meds: . Chlorhexidine Gluconate Cloth  6 each Topical Q0600  . collagenase   Topical Daily  . metoprolol succinate  12.5 mg Oral Daily  .  morphine injection  1 mg Intravenous Once  . rOPINIRole  2 mg Oral QID  . Warfarin - Pharmacist Dosing Inpatient   Does not apply q1600   Continuous Infusions: . heparin 1,350 Units/hr (08/04/20 0738)       LOS: 6 days    Time spent: 25 minutes with > 50% spent in coordination of care and direct patient contact.    Pennie Banter, DO [facility]  08/04/2020, 7:48 AM    If 7PM-7AM, please contact night-coverage. How to contact the [facility] Attending or Consulting provider 7A - 7P or covering provider during after hours 7P -7A, for this patient?    1. Check the care team in [facility] and look for a) attending/consulting [facility] provider listed and b) the [facility] team listed 2. Log into [email or web address] and use [facility]'s  universal password to access. If you do not have the password, please contact the hospital operator. 3. Locate the [facility] provider you are looking for under [facility] and page to a number that you can be directly reached. 4. If you still have difficulty reaching the provider, please page the [facility] (Director on Call) for the Hospitalists listed on amion for assistance.

## 2020-08-04 NOTE — Care Management Important Message (Signed)
Important Message  Patient Details  Name: [name] MRN: [id number] Date of Birth: June 14, 1931   Medicare Important Message Given:  Yes     [name] 08/04/2020, 1:41 PM

## 2020-08-04 NOTE — Progress Notes (Signed)
ANTICOAGULATION CONSULT NOTE  Pharmacy Consult for Heparin bridge to Warfarin Indication: aortic stenosis s/p St Jude mechanical AVR (2002)  Patient Measurements: Height: 5\' 11"  (180.3 cm) Weight: 92.2 kg (203 lb 4.2 oz) IBW/kg (Calculated) : 75.3  Heparin DW: 94.8 kg  Vital Signs: Temp: 98.6 F (37 C) (09/10 0501) Temp Source: Oral (09/10 0501) BP: 142/69 (09/10 0501) Pulse Rate: 87 (09/10 0501)  Labs: Recent Labs    08/02/20 0511 08/02/20 0511 08/03/20 0449 08/03/20 0449 08/03/20 1454 08/04/20 0517  HGB 7.6*   < > 8.2*   < > 8.5* 8.4*  HCT 23.1*   < > 25.7*  --  27.2* 26.1*  PLT 241  --  275  --   --  284  LABPROT 25.6*  --  22.6*  --   --  25.8*  INR 2.4*  --  2.1*  --   --  2.5*  HEPARINUNFRC 0.38  --  0.42  --   --  0.31  CREATININE 1.08  --  1.00  --   --  1.07   < > = values in this interval not displayed.    Estimated Creatinine Clearance: 57.5 mL/min (by C-G formula based on SCr of 1.07 mg/dL).   Medical History: Past Medical History:  Diagnosis Date  . Anxiety 10/11  . Aortic stenosis    a. s/p mechcanical AVR, 2002; b. 10/2018 Echo: Triv AI, mean grad 11/2018.  . Bradycardia    chronic, no symptoms 07/2010  . C. difficile colitis   . Carotid bruit    dopplers in past, no abnormalities  . Chronic diastolic CHF (congestive heart failure) (HCC)    a. Echo 03/2015: EF 60-65%, no RWMA, GR1DD, mild BAE, mild to mod MR, mod TR, PASP 65 mmHg; b. 01/2017 Echo: EF 55-60%, NRWMA, grade 1 diastolic dysfunction.  Normal functioning prosthetic aortic valve.  Mean gradient 50 mmHg.  Sev TR. PASP 02/2017; c. 10/2018 Echo: EF 55-60%, Triv AI, mod dil LA, mod-sev TR, PASP 35-40, mild to mod red RV fxn.  . Coronary artery disease    a. mild, cath, 08/2010; b. medically managed  . Decreased hearing    Right ear  . Depression   . Gastric ulcer   . GERD (gastroesophageal reflux disease)   . Hypertension    BP higher than usual 04/19/10; amlodipine increased by telephone  .  Mod-Sev Tricuspid regurgitation    a. 10/2018 Echo: Mod-Sev TR, PASP 35-72mmHg.  43m RLS (restless legs syndrome) 08/23/2015  . S/P AVR    a. St. Jude. mechanical 2002; b. echo 08/2010 EF 60%, trival AI, mild MR, AVR working well; c. on longterm warfarin tx  . SOB (shortness of breath) 10/11   08/2010,Episodes at 5 AM, eventually felt to be anxiety, after complete workup including catheterization, pt greatly improved with anxiety meds 11/11    Medications:  PTA warfarin 5mg  daily  Assessment: Pharmacy has been consulted for warfarin dosing and monitoring in an 86yo male with a PMH significant for aortic stenosis s/p St. Jude mechanical AVR in 2002 (on Coumadin), chronic diastolic HF, HTN, CAD, chronic venous stasis. Upon admission patient Hgb 4, Hct 14, and INR >10; rectal exam revealed heme positive stools. Patient received 2 units pRBC, 10mg  IV vitamin K x1, Kcentra, and 1 unit FFP. H&H has improved since admission.  EGD today did not identify source of bleeding. Colonoscopy was performed 08/03/20 and heparin was held during this procedure  warfarin course:  Date   INR  Dose 09/04   1.5   NONE 09/05   1.4   2.5 mg 09/06   1.5   2.5 mg 09/07   2.1   NONE  (2.5 mg vit K po) 09/08   2.4   NONE 09/09   2.1   2.5 mg 09/10   2.5   1 mg    Goal of Therapy:  INR 2.5-3.5 (per Coumadin clinic) Heparin level 0.3 - 0.7 IU/mL Monitor platelets by anticoagulation protocol: Yes   Plan:   Warfarin 1 mg po x 1 (dose reduced due to sensitive response and supratherapeutic admission INR on home dose)   heparin level  therapeutic  continue heparin at 1350 units/hr  Re-check heparin level in 8 hours  Continue heparin until > 2 consecutive therapeutic INRs  CBC, INR in am  Burnis Medin, PharmD, BCPS Clinical Pharmacist 08/04/2020 6:57 AM

## 2020-08-04 NOTE — Progress Notes (Signed)
Order acknowledged for IS. Patient already has one at bedside. Not appropriate at this time for IS reinstruction on home cpap.  Patient is very confused and per RN coming out of bed. Has given ativan. Will call if patient calms.

## 2020-08-04 NOTE — Consult Note (Signed)
WOC Nurse wound follow up Wound type:venous stasis wounds to bilateral lower legs. Continue enzymatic debridement daily.  Apply unna boots at discharge  Will follow.  Maple Hudson MSN, RN, FNP-BC CWON Wound, Ostomy, Continence Nurse Pager [phone]

## 2020-08-05 DIAGNOSIS — R410 Disorientation, unspecified: Secondary | ICD-10-CM

## 2020-08-05 LAB — LACTIC ACID, PLASMA
Lactic Acid, Venous: 1.5 mmol/L (ref 0.5–1.9)
Lactic Acid, Venous: 1.4 mmol/L (ref 0.5–1.9)

## 2020-08-05 LAB — RETICULOCYTES
Immature Retic Fract: 25.4 % — ABNORMAL HIGH (ref 2.3–15.9)
RBC.: 2.67 MIL/uL — ABNORMAL LOW (ref 4.22–5.81)
Retic Count, Absolute: 204.5 10*3/uL — ABNORMAL HIGH (ref 19.0–186.0)
Retic Ct Pct: 7.7 % — ABNORMAL HIGH (ref 0.4–3.1)

## 2020-08-05 LAB — HEPARIN LEVEL (UNFRACTIONATED): Heparin Unfractionated: 0.35 IU/mL (ref 0.30–0.70)

## 2020-08-05 LAB — BRAIN NATRIURETIC PEPTIDE: B Natriuretic Peptide: 279.3 pg/mL — ABNORMAL HIGH (ref 0.0–100.0)

## 2020-08-05 LAB — BASIC METABOLIC PANEL
Anion gap: 12 (ref 5–15)
BUN: 27 mg/dL — ABNORMAL HIGH (ref 8–23)
CO2: 23 mmol/L (ref 22–32)
Calcium: 8.1 mg/dL — ABNORMAL LOW (ref 8.9–10.3)
Chloride: 113 mmol/L — ABNORMAL HIGH (ref 98–111)
Creatinine, Ser: 1.17 mg/dL (ref 0.61–1.24)
GFR calc Af Amer: >60 mL/min (ref >60)
GFR calc non Af Amer: 56 mL/min — ABNORMAL LOW (ref >60)
Glucose, Bld: 105 mg/dL — ABNORMAL HIGH (ref 70–99)
Potassium: 3.8 mmol/L (ref 3.5–5.1)
Sodium: 148 mmol/L — ABNORMAL HIGH (ref 135–145)

## 2020-08-05 LAB — BLOOD GAS, ARTERIAL
Acid-Base Excess: 1 mmol/L (ref 0.0–2.0)
Bicarbonate: 25.1 mmol/L (ref 20.0–28.0)
FIO2: 0.3
O2 Saturation: 94.9 %
Patient temperature: 37
pCO2 arterial: 37 mmHg (ref 32.0–48.0)
pH, Arterial: 7.44 (ref 7.350–7.450)
pO2, Arterial: 72 mmHg — ABNORMAL LOW (ref 83.0–108.0)

## 2020-08-05 LAB — IRON AND TIBC
Iron: 20 ug/dL — ABNORMAL LOW (ref 45–182)
Saturation Ratios: 6 % — ABNORMAL LOW (ref 17.9–39.5)
TIBC: 325 ug/dL (ref 250–450)
UIBC: 305 ug/dL

## 2020-08-05 LAB — CBC
HCT: 25.4 % — ABNORMAL LOW (ref 39.0–52.0)
Hemoglobin: 7.5 g/dL — ABNORMAL LOW (ref 13.0–17.0)
MCH: 28.3 pg (ref 26.0–34.0)
MCHC: 29.5 g/dL — ABNORMAL LOW (ref 30.0–36.0)
MCV: 95.8 fL (ref 80.0–100.0)
Platelets: 266 10*3/uL (ref 150–400)
RBC: 2.65 MIL/uL — ABNORMAL LOW (ref 4.22–5.81)
RDW: 18.6 % — ABNORMAL HIGH (ref 11.5–15.5)
WBC: 10.9 10*3/uL — ABNORMAL HIGH (ref 4.0–10.5)
nRBC: 0 % (ref 0.0–0.2)

## 2020-08-05 LAB — HEMOGLOBIN AND HEMATOCRIT, BLOOD
HCT: 29.8 % — ABNORMAL LOW (ref 39.0–52.0)
HCT: 30.3 % — ABNORMAL LOW (ref 39.0–52.0)
Hemoglobin: 9.2 g/dL — ABNORMAL LOW (ref 13.0–17.0)
Hemoglobin: 9.4 g/dL — ABNORMAL LOW (ref 13.0–17.0)

## 2020-08-05 LAB — TROPONIN I (HIGH SENSITIVITY)
Troponin I (High Sensitivity): 32 ng/L — ABNORMAL HIGH (ref <18)
Troponin I (High Sensitivity): 36 ng/L — ABNORMAL HIGH (ref <18)

## 2020-08-05 LAB — AMMONIA: Ammonia: 11 umol/L (ref 9–35)

## 2020-08-05 LAB — PREPARE RBC (CROSSMATCH)

## 2020-08-05 LAB — FERRITIN: Ferritin: 60 ng/mL (ref 24–336)

## 2020-08-05 LAB — AST: AST: 33 U/L (ref 15–41)

## 2020-08-05 LAB — PROCALCITONIN: Procalcitonin: <0.1 ng/mL

## 2020-08-05 LAB — VITAMIN B12: Vitamin B-12: 828 pg/mL (ref 180–914)

## 2020-08-05 LAB — MAGNESIUM: Magnesium: 2.5 mg/dL — ABNORMAL HIGH (ref 1.7–2.4)

## 2020-08-05 LAB — PROTIME-INR
INR: 3.1 — ABNORMAL HIGH (ref 0.8–1.2)
Prothrombin Time: 30.8 seconds — ABNORMAL HIGH (ref 11.4–15.2)

## 2020-08-05 LAB — ALT: ALT: 21 U/L (ref 0–44)

## 2020-08-05 LAB — FOLATE: Folate: 11 ng/mL (ref >5.9)

## 2020-08-05 MED ORDER — DEXTROSE-NACL 5-0.45 % IV SOLN: INTRAVENOUS | Status: DC

## 2020-08-05 MED ORDER — WARFARIN SODIUM 1 MG PO TABS
1.5000 mg | ORAL_TABLET | Freq: Once | ORAL | Status: DC
  Filled 2020-08-05: qty 1

## 2020-08-05 MED ORDER — IPRATROPIUM-ALBUTEROL 0.5-2.5 (3) MG/3ML IN SOLN
3.0000 mL | Freq: Four times a day (QID) | RESPIRATORY_TRACT | Status: DC
  Administered 2020-08-05 – 2020-08-10 (×17): 3 mL via RESPIRATORY_TRACT
  Filled 2020-08-05 (×19): qty 3

## 2020-08-05 MED ORDER — SODIUM CHLORIDE 0.9% IV SOLUTION: Freq: Once | INTRAVENOUS | Status: AC

## 2020-08-05 NOTE — Consult Note (Signed)
NEURO HOSPITALIST CONSULT NOTE   Requesting physician: Dr. Arbutus Ped  Reason for Consult: Delirium  History obtained from:  Son and Chart     HPI:                                                                                                                                          [name] is an 86 y.o. male with a PMHx of anxiety, c. Diff, CAD, status post mechanical valve placement on Coumadin, depression, obstructive sleep apnea (CPAP), GERD, GI bleed, HTN, restless leg syndrome, prior tobacco use and chronic heart failure who presented via EMS from his assisted living facility on 9/4 with concerns for respiratory distress. Staff at the facility had also noted that he was pale and his fingertips were blue. Although he denied any obvious bleeding in his stool, labs revealed severe anemia with Hgb of 4.1 and stool sample in the ED was melanotic and hemoccult-positive. He was transfused with 2 units in the ED with improvement of Hgb to 6.3. CT head obtained on 9/5 for acute neurological change revealed findings consistent with an aging brain without acute or reversible finding. During his stay, he has gradually become more encephalopathic. Neurology was called to evaluate.   At his baseline, the patient is essentially fully independent. He carries on full conversations and is fully lucid, per son, without any memory problems. He lives in the assisted living portion of a facility where his wife also resides, separate from him in the SNF unit.   Upper endoscopy (9/5): - Large hiatal hernia. - Normal examined duodenum. - The examination was otherwise normal.  Colonoscopy (9/9): - Preparation of the colon was fair. - Non-bleeding internal hemorrhoids. - Diverticulosis in the sigmoid colon and in the descending colon. - Stool in the entire examined colon. - Blood in the sigmoid colon    Past Medical History:  Diagnosis Date  . Anxiety 10/11  . Aortic  stenosis    a. s/p mechcanical AVR, 2002; b. 10/2018 Echo: Triv AI, mean grad 44mHg.  . Bradycardia    chronic, no symptoms 07/2010  . C. difficile colitis   . Carotid bruit    dopplers in past, no abnormalities  . Chronic diastolic CHF (congestive heart failure) ([facility]    a. Echo 03/2015: EF 60-65%, no RWMA, GR1DD, mild BAE, mild to mod MR, mod TR, PASP 65 mmHg; b. 01/2017 Echo: EF 55-60%, NRWMA, grade 1 diastolic dysfunction.  Normal functioning prosthetic aortic valve.  Mean gradient 50 mmHg.  Sev TR. PASP 551mg; c. 10/2018 Echo: EF 55-60%, Triv AI, mod dil LA, mod-sev TR, PASP 35-40, mild to mod red RV fxn.  . Coronary artery disease    a. mild, cath, 08/2010; b. medically managed  . Decreased hearing  Right ear  . Depression   . Gastric ulcer   . GERD (gastroesophageal reflux disease)   . Hypertension    BP higher than usual 04/19/10; amlodipine increased by telephone  . Mod-Sev Tricuspid regurgitation    a. 10/2018 Echo: Mod-Sev TR, PASP 35-99mHg.  .Marland KitchenRLS (restless legs syndrome) 08/23/2015  . S/P AVR    a. [employer]. mechanical 2002; b. echo 08/2010 EF 60%, trival AI, mild MR, AVR working well; c. on longterm warfarin tx  . SOB (shortness of breath) 10/11   08/2010,Episodes at 5 AM, eventually felt to be anxiety, after complete workup including catheterization, pt greatly improved with anxiety meds 11/11    Past Surgical History:  Procedure Laterality Date  . CARDIAC CATHETERIZATION    . COLONOSCOPY WITH PROPOFOL N/A 08/03/2020   Procedure: COLONOSCOPY WITH PROPOFOL;  Surgeon: Toledo, TBenay Pike MD;  Location: [facility] ENDOSCOPY;  Service: Gastroenterology;  Laterality: N/A;  . ESOPHAGOGASTRODUODENOSCOPY N/A 04/05/2015   Procedure: ESOPHAGOGASTRODUODENOSCOPY (EGD);  Surgeon: RManya Silvas MD;  [facility];  Service: Endoscopy;  Laterality: N/A;  . ESOPHAGOGASTRODUODENOSCOPY N/A 04/17/2015   Procedure: ESOPHAGOGASTRODUODENOSCOPY (EGD);  Surgeon: RManya Silvas MD;   [facility]  Service: Endoscopy;  Laterality: N/A;  . ESOPHAGOGASTRODUODENOSCOPY N/A 08/02/2015   Procedure: ESOPHAGOGASTRODUODENOSCOPY (EGD);  Surgeon: PHulen Luster MD;  [facility];  Service: Endoscopy;  Laterality: N/A;  . ESOPHAGOGASTRODUODENOSCOPY N/A 07/30/2020   Procedure: ESOPHAGOGASTRODUODENOSCOPY (EGD);  Surgeon: Toledo, TBenay Pike MD;  Location: [facility] ENDOSCOPY;  Service: Gastroenterology;  Laterality: N/A;  . HERNIA REPAIR    . JOINT REPLACEMENT    . TOTAL HIP ARTHROPLASTY    . VALVE REPLACEMENT  1/02   Aortic; echo 3/09 valve working well; echo 10/11 working well; put on Coumadin    Family History  Problem Relation Age of Onset  . Hypertension Mother   . Diabetes type II Mother   . Heart disease Mother   . Heart attack Mother   . Hypertension Father   . Heart disease Father   . Stroke Father   . Stroke Brother   . Stroke Brother   . Hypertension Other               Social History:  reports that he quit smoking about 42 years ago. His smoking use included cigarettes. He has never used smokeless tobacco. He reports current alcohol use of about 3.0 standard drinks of alcohol per week. He reports that he does not use drugs.  Allergies  Allergen Reactions  . Sulfa Antibiotics Other (See Comments)    Reaction:  Unknown   . Levofloxacin Nausea Only    INPATIENT MEDICATIONS:                                                                                                                     Scheduled: . collagenase   Topical Daily  . DULoxetine  30 mg Oral Daily  . folic acid  1 mg Oral Daily  . ipratropium-albuterol  3 mL Nebulization Q6H WA  . metoprolol succinate  12.5 mg Oral Daily  .  morphine injection  1 mg Intravenous Once  . multivitamin with minerals  1 tablet Oral Daily  . rOPINIRole  2 mg Oral QID  . thiamine  100 mg Oral Daily  . warfarin  1.5 mg Oral Once  . Warfarin - Pharmacist Dosing Inpatient   Does not apply q1600    ROS:                                                                                                                                        Unable to assess due to AMS.    Blood pressure (!) 156/70, pulse 93, temperature 97.7 F (36.5 C), temperature source Oral, resp. rate 20, height _0  (1.803 m), weight 108.4 kg, SpO2 99 %.   General Examination:                                                                                                       Physical Exam  HEENT-  [place]/AT. Dry oral mucosa.  Lungs- Erratic breathing pattern during a period of increased respiratory rate and depth that had a Cheyne-Stokes quality Extremities- Chronic pigmentary changes distal bilateral lower extremities.   Neurological Examination Mental Status: Eyes closed with severely depressed level of consciousness, but will localize at times to noxious and can be aroused to an eye-open state during which he briefly answered two questions (his name and number of fingers being held up) and followed a simple command, followed by rapidly falling asleep. At times he became spontaneously agitated while still in an eyes-closed state, moving around in the bed, grabbing at his sheets, picking at his lower lip and in one instance with Cheyne-Stokes like respirations. Did not answer any orientation questions other than his name.  Cranial Nerves: II: With eyelids held open, there is no blink to threat. PERRL.   III,IV, VI: Eyes slightly exotropic, near the midline. No nystagmus.  V,VII: Face is grossly symmetric. Reacts to eyelid stimulation bilaterally VIII: Hearing intact to voice when briefly aroused IX,X: Unable to assess XI: No gross asymmetry XII: Midline tongue is noted with eye open Motor/Sensory: Moves/thrashes all 4 extremities symmetrically to noxious. Not able to follow commands for formal motor testing.  Passive dorsiflexion of wrists and digits reveals asterixis.  Deep Tendon Reflexes: Hypoactive x 4.  Plantars:  Upgoing bilaterally  Cerebellar/Gait: Unable to assess    Lab Results: Basic Metabolic  Panel: Recent Labs  Lab 07/30/20 0506 07/31/20 0226 08/01/20 0015 08/01/20 0015 08/02/20 1030 08/02/20 0511 08/03/20 0449 08/04/20 0517 08/05/20 0635  NA 145   < > 143  --  142  --  148* 149* 148*  K 3.0*   < > 3.0*  --  3.2*  --  3.5 3.5 3.8  CL 108   < > 105  --  109  --  111 112* 113*  CO2 28   < > 27  --  27  --  _0 GLUCOSE 135*   < > 108*  --  112*  --  105* 88 105*  BUN 99*   < > 69*  --  49*  --  36* 29* 27*  CREATININE 1.22   < > 1.30*  --  1.08  --  1.00 1.07 1.17  CALCIUM 7.9*   < > 8.3*   < > 8.1*   < > 8.5* 8.3* 8.1*  MG 2.6*  --   --   --   --   --  3.0*  --  2.5*   < > = values in this interval not displayed.    CBC: Recent Labs  Lab 08/01/20 0015 08/01/20 1836 08/02/20 0511 08/03/20 0449 08/03/20 1454 08/04/20 0517 08/05/20 0635  WBC 14.3*  --  9.9 9.9  --  9.9 10.9*  HGB 8.4*   < > 7.6* 8.2* 8.5* 8.4* 7.5*  HCT 25.9*   < > 23.1* 25.7* 27.2* 26.1* 25.4*  MCV 90.2  --  91.3 93.1  --  90.0 95.8  PLT 260  --  241 275  --  284 266   < > = values in this interval not displayed.    Cardiac Enzymes: No results for input(s): CKTOTAL, CKMB, CKMBINDEX, TROPONINI in the last 168 hours.  Lipid Panel: No results for input(s): CHOL, TRIG, HDL, CHOLHDL, VLDL, LDLCALC in the last 168 hours.  Imaging: DG Chest 1 View  Result Date: 08/04/2020 CLINICAL DATA:  Wheezing. EXAM: CHEST  1 VIEW COMPARISON:  07/31/2020. FINDINGS: Median sternotomy with stable cardiomegaly. Aortic valve replacement. There is increasing peribronchial thickening a possible developing Kerley B-lines. Suspected small pleural effusions. No pneumothorax or confluent airspace disease. Stable sclerotic density projecting over the left scapula likely bone island. IMPRESSION: Cardiomegaly with increasing peribronchial thickening and possible developing Kerley B-lines, suspicious for pulmonary edema. Small  pleural effusions. Overall findings suspicious for CHF. Electronically Signed   By: Keith Rake M.D.   On: 08/04/2020 21:36    Assessment: 86 year old male with progressively worsening delirium in the setting of GIB.  1. Exam reveals findings most consistent with an agitated delirium. Asterixis is also noted, which suggests a metabolic encephalopathy.  2. Given GIB with heavy blood loss (Hgb of 4.1 on admission), metabolic encephalopathy secondary to hyperammonemia as a byproduct of metabolism of blood by intestinal flora is relatively high on the DDx. Usually this occurs in the setting of comorbid liver disease.  3. Elevated AST (67) with normal ALT on admission. This pattern raises the question of possible alcoholic cirrhosis, with overlapping delirium tremens also on the DDx for his AMS.  4. Elevated BUN. Given his dry oral mucosa, this suggests possible volume depletion.  5. Elevated ESR and C-reactive protein. May be indicative of an infection or autoimmune condition. Albumin level is low. Alkaline phosphatase is elevated. Coags cannot be used to assess liver function due to anticoagulation.  6. CT head on  9/5: Aging brain without acute or reversible finding.  Recommendations: 1. Ammonia level was ordered today and is pending.  2. EEG on Monday to assess for possible triphasic waveforms, which would be compatible with a metabolic encephalopathy . 3. Currently being transfused with pRBCs. Start IVF when blood transfusion is completed. Monitor closely as he has a diagnosis of chronic diastolic CHF.  4. Gamma glutamyl transferase and repeat AST/ALT have been ordered.     Electronically signed: Dr. Kerney Elbe 08/05/2020, 5:43 PM

## 2020-08-05 NOTE — Progress Notes (Signed)
Pt very agitated. Pulls O2 cannula off frequently

## 2020-08-05 NOTE — Progress Notes (Addendum)
PROGRESS NOTE    [name]   YDX:[id number]  DOB: Dec 11, 1930  PCP: Gracelyn Nurse, MD    DOA: 07/29/2020 LOS: 7   Brief Narrative   [name] is a 86 y.o. M with dCHF, HTN, CAD, AS s/p St Jude AVR 2002 on coumadin, tricuspid regurg, chronic anemia, Cdiff remotely, and PUD who presented to the ED on 07/29/20 from his SNF with confusion, shortness of breath and found to be hypoxic by SNF staff.   In the ER, SpO2 < 90%, tachypneic, severely anemic with Hgb 4 g/dL, supratherapeutic INR >72, and rectal exam positive for blood.   Given PRBCs x2 units, PCCs, vitamin K and FFP.  Started on PPI, admitted to hospitalist service with GI consulted.     Assessment & Plan   Principal Problem:   Acute blood loss anemia Active Problems:   Depression   Aortic stenosis   History of mechanical aortic valve replacement   Chronic diastolic CHF (congestive heart failure) (HCC)   H/O atrial flutter   Lactic acidosis   Hemorrhagic shock (HCC)   Pressure injury of skin   Wheezing / Dyspnea - patient having intermittent episodes of apparent respiratory distress with audible wheezing and pt appears gasping.  However, he has no expiratory wheezes or crackles on lung auscultation.  Suspect we are hearing upper airway sounds, and during long encounter in the room today, noted that patient's gasping occurs when he starts becoming agitated, settles down when he calms down.  O2 requirement has remained stable. --ABG showed normal pCO2, good air movement on exam and no expiratory wheezes, not likely bronchospasm --no crackles on exam when pt calm, hypernatremia, elevated BUN, no edema, dry mucosal membranes - clinically appears dry despite cxr appearance.  BNP is lower than several days ago.   --Avoid further lasix for now. --Inaccurate I/O's - Net fluid balance is unknown, outputs are not being captured consistently as patient is incontinent of urine.  --negative procal and lactic acid, no  leukocytosis on 9/11  --monitor for signs of infection  --stopped gentle D5W for his hypernatremia --Echo pending --check troponins   Anorexia - due to GI procedures and ongoing confusion/delirium, patient has not eaten in several days.  Family expressing concern, what plan would be if he does not start to eat. Discussed NG tube vs PEG tube and artificial feeds.  Explained these would both increase his confusion and agitation.   Monitor intake.  Assist with feeding.  Encourage frequent PO intake, drink water.    Acute metabolic encephalopathy / Delirium - persistent since 9/8 (day 3 of admission) Mild, likely related to hospital environment.  Afebrile, leukocytosis resolved, was likely reactive. --UA and Blood cx pending - rule out infection --CXR's have been negative for PNA, more consistent with pulmonary edema --Ammonia level pending --No CIWA needed, patient NOT a heavy/regular drinker --Consulted neurology for further evaluation --Delirium precautions:     -Lights and TV off, minimize interruptions at night    -Blinds open and lights on during day    -Glasses/hearing aid with patient    -Frequent reorientation    -PT/OT when able    -Avoid sedation medications/Beers list medications   Acute GI bleed - likely diverticular bleeding Acute blood loss anemia superimposed on chronic iron deficiency anemia Acquired thrombophilia with supratherapeutic INR INR > 10 at admission.  Given PCC, phytonadione and FFP at admission.  Admitted and started on PPI. EGD 9/5 by Dr. Norma Fredrickson, no ulcers or other bleeding source  was found. Colonoscopy 9/9 by Dr. Norma Fredrickson - fair prep, diverticulosis on sigmoid and descending colon, blood in sigmoid colon, non-bleeding internal hemorrhoids. 9/9 - ongoing melena per nursing 9/10-11 - no reports of melena or hematochezia --stat H&H --resumed on heparin gtt >> bridge warfarin  --GI following -Trend Hgb, transfuse for Hbg < 8 given CHF and cardiogenic shock  at presentation --will transfuse 1 unit pRBC's today for Hbg 7.5 --IV iron possibly tomorrow   Hypernatremia - Na 148>>149>>148 this AM, due to NPO status for procedure, also poor PO intake likely due to confusion/delirium.  Expect improvement with resumption of diet, but pt having poor oral intake due to his confusion. --Had to stop D5W this AM due to apparent respiratory distress, concern for fluid overload --Depsite D5 running since yesterday, sodium came up only by 1 pt --BMP in AM --encourage pt to drink water / PO intake --assist with feeding patient   History of aortic stenosis s/p St Jude aortic valve  Supratherapeutic INR - POA, INR > 10.  Target INR 2.5-3.5.  Patient's INR was reversed on admission. -Resumed heparin gtt after colonoscopy -Resumed warfarin, INR goal 2.5-3.5 --Pharmacy following    Abdominal distension, possible very mild ileus - Resolved. S/p bowel prep 9/5 evening, and actually didn't have bowel movements for >12 hours.   Monitor abdominal exam and stool output.   Acute on chronic diastolic CHF - POA. Resolved. Hypertension Coronary artery disease, secondary prevention On admission, CXR showed edema, pt was hypoxic 9/5.   Improved with Lasix which was stopped.   Lasix was again given 9/10-11 due to signs of respiratory distress and cxr w/edema. -Continue metoprolol -Hold clonidine, doxazosin, Imdur due to low BP --Home torsemide and metolazone on hold due to BP as well --Monitor volume status --Repeat Echo pending (last in Mar) --consult cardiology if echo has changed --9/11 - with hypernatremia, elevated BUN, dry MM's, no edema, no crackles - clinically seems dry.  Will avoid more Lasix for now.  Getting blood.  Consider gentle D5-1/2NS after blood for hydration and hypernatremia.   Hypokalemia - replaced.  Monitor K and Mg, replace as needed.   Restless leg syndrome - continue ropinirole  Gout - hold allopurinol for now    Depression - hold duloxetine  BPH - hold finasteride   Lower extremity venous stasis wounds - bilateral, POA. WOC consulted - recommends Cleanse lower legs with NS and pat dry Apply Santyl to open wounds  Cover with NS moist 2x2.  Secure with dry dressing and kerlix/tape. Change daily.   Plan to wrap with compression on discharge.   Pressure injury of sacrum, stage 2 - present on admission  Pressure Injury 08/02/20 Sacrum Stage 2 -  Partial thickness loss of dermis presenting as a shallow open injury with a red, pink wound bed without slough. (Active)  08/02/20 1600  Location: Sacrum  Location Orientation:   Staging: Stage 2 -  Partial thickness loss of dermis presenting as a shallow open injury with a red, pink wound bed without slough.  Wound Description (Comments):   Present on Admission:      DVT prophylaxis: SCDs Start: 07/29/20 1352   Diet:  Diet Orders (From admission, onward)    Start     Ordered   08/03/20 1400  DIET SOFT Room service appropriate? Yes; Fluid consistency: Thin  Diet effective now       Question Answer Comment  Room service appropriate? Yes   Fluid consistency: Thin  08/03/20 1359            Code Status: Full Code    Subjective 08/05/20    Patient seen with son at bedside and daughter on speaker phone today.  Patient quite agitated this AM, pulling at his gown, leads, trying to get out of bed.  He has intermittent spells of respiratory distress, gasping and audible wheezing, normal o2 sats.    Long discussion with family about his persistent delirium, they are very concerned not improving yet and patient has not been eating.   Disposition Plan & Communication   Status is: Inpatient  Remain Inpatient Appropriate because severity of illness requiring IV medications, awaiting therapeutic INR x 48 hours.    Dispo: The patient is from: SNF              Anticipated d/c is to: SNF              Anticipated d/c date is: 2 days               Patient currently is not medically stable for discharge.   Family Communication: son was at bedside and daughter by phone during encounter today, 9/11   Consults, Procedures, Significant Events   Consultants:   Gastroenterology  Neurology  Procedures:   EGD 07/30/20   Objective   Vitals:   08/04/20 2002 08/04/20 2310 08/05/20 0445 08/05/20 0445  BP: 135/90 (!) 175/89  132/78  Pulse: 74 (!) 114  90  Resp: 18   16  Temp: 98.3 F (36.8 C)   100 F (37.8 C)  TempSrc: Axillary   Axillary  SpO2: 100% 98%  97%  Weight:   108.4 kg   Height:        Intake/Output Summary (Last 24 hours) at 08/05/2020 0733 Last data filed at 08/05/2020 0300 Gross per 24 hour  Intake 767.32 ml  Output 100 ml  Net 667.32 ml   Filed Weights   08/03/20 0500 08/03/20 1144 08/05/20 0445  Weight: 92.2 kg 92.2 kg 108.4 kg    Physical Exam:  General exam: confused, lethargic, intermittently agitated, appears in mild distress at times Respiratory system: CTAB when patient calm, no wheezes or crackles appreciated, intermittent episdoes of increased respiratory with audible wheezing and use of accessory muscles (occur during bouts of agitation), no expiratory wheezes with these episodes (likely referred upper airway sounds) Cardiovascular system:  RRR, normal S1/S2, no pedal edema.   Central nervous system: A&Oriented only to self, no gross focal neurologic deficits, normal speech Extremities: moves all, venous stasis of LE's, no edema   Labs   Data Reviewed: I have personally reviewed following labs and imaging studies  CBC: Recent Labs  Lab 07/29/20 0903 07/29/20 1138 08/01/20 0015 08/01/20 1836 08/02/20 0511 08/03/20 0449 08/03/20 1454 08/04/20 0517 08/05/20 0635  WBC 10.3   < > 14.3*  --  9.9 9.9  --  9.9 10.9*  NEUTROABS 8.0*  --   --   --   --   --   --   --   --   HGB 4.1*   < > 8.4*   < > 7.6* 8.2* 8.5* 8.4* 7.5*  HCT 13.0*   < > 25.9*   < > 23.1* 25.7* 27.2* 26.1* 25.4*  MCV  90.9   < > 90.2  --  91.3 93.1  --  90.0 95.8  PLT 257   < > 260  --  241 275  --  284 266   < > =  values in this interval not displayed.   Basic Metabolic Panel: Recent Labs  Lab 07/29/20 0903 07/29/20 0903 07/30/20 0506 07/31/20 0226 08/01/20 0015 08/02/20 0511 08/03/20 0449 08/04/20 0517 08/05/20 0635  NA 142   < > 145   < > 143 142 148* 149* 148*  K 3.5   < > 3.0*   < > 3.0* 3.2* 3.5 3.5 3.8  CL 103   < > 108   < > 105 109 111 112* 113*  CO2 27   < > 28   < > 27 27 26 26 23   GLUCOSE 139*   < > 135*   < > 108* 112* 105* 88 105*  BUN 120*   < > 99*   < > 69* 49* 36* 29* 27*  CREATININE 1.42*   < > 1.22   < > 1.30* 1.08 1.00 1.07 1.17  CALCIUM 8.2*   < > 7.9*   < > 8.3* 8.1* 8.5* 8.3* 8.1*  MG 2.6*  --  2.6*  --   --   --  3.0*  --  2.5*   < > = values in this interval not displayed.   GFR: Estimated Creatinine Clearance: 56.7 mL/min (by C-G formula based on SCr of 1.17 mg/dL). Liver Function Tests: Recent Labs  Lab 07/29/20 0903  AST 67*  ALT 39  ALKPHOS 160*  BILITOT 0.8  PROT 4.8*  ALBUMIN 2.5*   No results for input(s): LIPASE, AMYLASE in the last 168 hours. No results for input(s): AMMONIA in the last 168 hours. Coagulation Profile: Recent Labs  Lab 08/01/20 0015 08/02/20 0511 08/03/20 0449 08/04/20 0517 08/05/20 0635  INR 2.1* 2.4* 2.1* 2.5* 3.1*   Cardiac Enzymes: No results for input(s): CKTOTAL, CKMB, CKMBINDEX, TROPONINI in the last 168 hours. BNP (last 3 results) No results for input(s): PROBNP in the last 8760 hours. HbA1C: No results for input(s): HGBA1C in the last 72 hours. CBG: Recent Labs  Lab 08/02/20 1624 08/02/20 2031 08/03/20 2040 08/04/20 0730 08/04/20 2337  GLUCAP 99 110* 108* 94 138*   Lipid Profile: No results for input(s): CHOL, HDL, LDLCALC, TRIG, CHOLHDL, LDLDIRECT in the last 72 hours. Thyroid Function Tests: No results for input(s): TSH, T4TOTAL, FREET4, T3FREE, THYROIDAB in the last 72 hours. Anemia Panel: No  results for input(s): VITAMINB12, FOLATE, FERRITIN, TIBC, IRON, RETICCTPCT in the last 72 hours. Sepsis Labs: Recent Labs  Lab 07/29/20 0903 07/29/20 1138  LATICACIDVEN 4.3* 2.7*    Recent Results (from the past 240 hour(s))  SARS Coronavirus 2 by RT PCR (hospital order, performed in [facility] hospital lab) Nasopharyngeal Nasopharyngeal Swab     Status: None   Collection Time: 07/29/20  9:54 AM   Specimen: Nasopharyngeal Swab  Result Value Ref Range Status   SARS Coronavirus 2 NEGATIVE NEGATIVE Final    Comment: (NOTE) SARS-CoV-2 target nucleic acids are NOT DETECTED.  The SARS-CoV-2 RNA is generally detectable in upper and lower respiratory specimens during the acute phase of infection. The lowest concentration of SARS-CoV-2 viral copies this assay can detect is 250 copies / mL. A negative result does not preclude SARS-CoV-2 infection and should not be used as the sole basis for treatment or other patient management decisions.  A negative result may occur with improper specimen collection / handling, submission of specimen other than nasopharyngeal swab, presence of viral mutation(s) within the areas targeted by this assay, and inadequate number of viral copies (<250 copies / mL). A negative result must be  combined with clinical observations, patient history, and epidemiological information.  Fact Sheet for Patients:   BoilerBrush.com.cy  Fact Sheet for Healthcare Providers: https://pope.com/  This test is not yet approved or  cleared by the [place] FDA and has been authorized for detection and/or diagnosis of SARS-CoV-2 by FDA under an Emergency Use Authorization (EUA).  This EUA will remain in effect (meaning this test can be used) for the duration of the COVID-19 declaration under Section 564(b)(1) of the Act, 21 U.S.C. section 360bbb-3(b)(1), unless the authorization is terminated or revoked sooner.  Performed at  [facility], [address] [place], [place] [address]   Blood culture (routine single)     Status: None   Collection Time: 07/29/20  9:54 AM   Specimen: BLOOD  Result Value Ref Range Status   Specimen Description BLOOD RIGHT HAND  Final   Special Requests   Final    BOTTLES DRAWN AEROBIC AND ANAEROBIC Blood Culture adequate volume   Culture   Final    NO GROWTH 5 DAYS Performed at [facility], [address], [place], [place] [address]    Report Status 08/03/2020 FINAL  Final  Urine culture     Status: None   Collection Time: 07/29/20  9:57 AM   Specimen: Urine, Random  Result Value Ref Range Status   Specimen Description   Final    URINE, RANDOM Performed at [facility], [address], [place], [place] [address]    Special Requests   Final    NONE Performed at [facility], [address], [place], [place] [address]    Culture   Final    NO GROWTH Performed at [facility], [address] [place], [place] [address]    Report Status 07/31/2020 FINAL  Final  MRSA PCR Screening     Status: None   Collection Time: 07/31/20  8:25 PM   Specimen: Nasal Mucosa; Nasopharyngeal  Result Value Ref Range Status   MRSA by PCR NEGATIVE NEGATIVE Final    Comment:        The GeneXpert MRSA Assay (FDA approved for NASAL specimens only), is one component of a comprehensive MRSA colonization surveillance program. It is not intended to diagnose MRSA infection nor to guide or monitor treatment for MRSA infections. Performed at [facility], [address] [place], [place] [address]       Imaging Studies   DG Chest 1 View  Result Date: 08/04/2020 CLINICAL DATA:  Wheezing. EXAM: CHEST  1 VIEW COMPARISON:  07/31/2020. FINDINGS: Median sternotomy with stable cardiomegaly. Aortic valve replacement. There is increasing peribronchial thickening a possible developing Kerley B-lines. Suspected small pleural effusions. No  pneumothorax or confluent airspace disease. Stable sclerotic density projecting over the left scapula likely bone island. IMPRESSION: Cardiomegaly with increasing peribronchial thickening and possible developing Kerley B-lines, suspicious for pulmonary edema. Small pleural effusions. Overall findings suspicious for CHF. Electronically Signed   By: Narda Rutherford M.D.   On: 08/04/2020 21:36     Medications   Scheduled Meds: . collagenase   Topical Daily  . DULoxetine  30 mg Oral Daily  . folic acid  1 mg Oral Daily  . furosemide  40 mg Intravenous Q12H  . metoprolol succinate  12.5 mg Oral Daily  .  morphine injection  1 mg Intravenous Once  . multivitamin with minerals  1 tablet Oral Daily  . rOPINIRole  2 mg Oral QID  . thiamine  100 mg  Oral Daily   Or  . thiamine  100 mg Intravenous Daily  . Warfarin - Pharmacist Dosing Inpatient   Does not apply q1600   Continuous Infusions: . dextrose 75 mL/hr at 08/05/20 0300  . heparin 1,350 Units/hr (08/05/20 0516)       LOS: 7 days    Time spent: 45 minutes with > 50% spent in coordination of care and direct patient contact.    Pennie Banter, DO [facility]  08/05/2020, 7:33 AM    If 7PM-7AM, please contact night-coverage. How to contact the [facility] Attending or Consulting provider 7A - 7P or covering provider during after hours 7P -7A, for this patient?    1. Check the care team in [facility] and look for a) attending/consulting [facility] provider listed and b) the [facility] team listed 2. Log into [email or web address] and use [place]'s universal password to access. If you do not have the password, please contact the hospital operator. 3. Locate the [facility] provider you are looking for under [facility] and page to a number that you can be directly reached. 4. If you still have difficulty reaching the provider, please page the [facility] (Director on Call) for the Hospitalists listed on amion for assistance.

## 2020-08-05 NOTE — Progress Notes (Addendum)
ANTICOAGULATION CONSULT NOTE  Pharmacy Consult for Warfarin Indication: aortic stenosis s/p St Jude mechanical AVR (2002)  Patient Measurements: Height: 5\' 11"  (180.3 cm) Weight: 108.4 kg (239 lb) (low bed) IBW/kg (Calculated) : 75.3  Heparin DW: 94.8 kg  Vital Signs: Temp: 100 F (37.8 C) (09/11 0445) Temp Source: Axillary (09/11 0445) BP: 132/78 (09/11 0445) Pulse Rate: 90 (09/11 0445)  Labs: Recent Labs    08/03/20 0449 08/03/20 0449 08/03/20 1454 08/04/20 0517 08/04/20 1402 08/05/20 0635  HGB 8.2*   < > 8.5* 8.4*  --  7.5*  HCT 25.7*   < > 27.2* 26.1*  --  25.4*  PLT 275  --   --  284  --  266  LABPROT 22.6*  --   --  25.8*  --  30.8*  INR 2.1*  --   --  2.5*  --  3.1*  HEPARINUNFRC 0.42   < >  --  0.31 0.43 0.35  CREATININE 1.00  --   --  1.07  --  1.17   < > = values in this interval not displayed.    Estimated Creatinine Clearance: 56.7 mL/min (by C-G formula based on SCr of 1.17 mg/dL).   Medical History: Past Medical History:  Diagnosis Date  . Anxiety 10/11  . Aortic stenosis    a. s/p mechcanical AVR, 2002; b. 10/2018 Echo: Triv AI, mean grad 11/2018.  . Bradycardia    chronic, no symptoms 07/2010  . C. difficile colitis   . Carotid bruit    dopplers in past, no abnormalities  . Chronic diastolic CHF (congestive heart failure) (HCC)    a. Echo 03/2015: EF 60-65%, no RWMA, GR1DD, mild BAE, mild to mod MR, mod TR, PASP 65 mmHg; b. 01/2017 Echo: EF 55-60%, NRWMA, grade 1 diastolic dysfunction.  Normal functioning prosthetic aortic valve.  Mean gradient 50 mmHg.  Sev TR. PASP 02/2017; c. 10/2018 Echo: EF 55-60%, Triv AI, mod dil LA, mod-sev TR, PASP 35-40, mild to mod red RV fxn.  . Coronary artery disease    a. mild, cath, 08/2010; b. medically managed  . Decreased hearing    Right ear  . Depression   . Gastric ulcer   . GERD (gastroesophageal reflux disease)   . Hypertension    BP higher than usual 04/19/10; amlodipine increased by telephone  . Mod-Sev  Tricuspid regurgitation    a. 10/2018 Echo: Mod-Sev TR, PASP 35-15mmHg.  43m RLS (restless legs syndrome) 08/23/2015  . S/P AVR    a. St. Jude. mechanical 2002; b. echo 08/2010 EF 60%, trival AI, mild MR, AVR working well; c. on longterm warfarin tx  . SOB (shortness of breath) 10/11   08/2010,Episodes at 5 AM, eventually felt to be anxiety, after complete workup including catheterization, pt greatly improved with anxiety meds 11/11    Medications:  PTA warfarin 5mg  daily  Assessment: Pharmacy has been consulted for warfarin dosing and monitoring in an 86yo male with a PMH significant for aortic stenosis s/p St. Jude mechanical AVR in 2002 (on Coumadin), chronic diastolic HF, HTN, CAD, chronic venous stasis. Upon admission patient Hgb 4, Hct 14, and INR >10; rectal exam revealed heme positive stools. Patient received 2 units pRBC, 10mg  IV vitamin K x1, Kcentra, and 1 unit FFP. H&H has improved since admission.  EGD today did not identify source of bleeding. Colonoscopy was performed 08/03/20 and heparin was held during this procedure  warfarin course:  Date   INR   Dose 09/04  1.5   NONE 09/05   1.4   2.5 mg 09/06   1.5   2.5 mg 09/07   2.1   NONE  (2.5 mg vit K po) 09/08   2.4   NONE 09/09   2.1   2.5 mg 09/10   2.5   1 mg   09/11   3.1     Goal of Therapy:  INR 2.5-3.5 (per Coumadin clinic) Heparin level 0.3 - 0.7 IU/mL Monitor platelets by anticoagulation protocol: Yes   Plan:   Warfarin 1.5 mg po x 1 (dose reduced due to sensitive response and supratherapeutic admission INR on home dose)   INR therapeutic x 2 - will dc heparin per protocol  CBC, INR in am  Albina Billet, PharmD, BCPS Clinical Pharmacist 08/05/2020 7:46 AM

## 2020-08-06 ENCOUNTER — Inpatient Hospital Stay: Payer: Medicare Other

## 2020-08-06 ENCOUNTER — Inpatient Hospital Stay (HOSPITAL_COMMUNITY)
Admit: 2020-08-06 | Discharge: 2020-08-06 | Disposition: A | Discharge status: Home / Self Care | Payer: Medicare Other | Attending: Internal Medicine | Admitting: Internal Medicine

## 2020-08-06 DIAGNOSIS — I350 Nonrheumatic aortic (valve) stenosis: Secondary | ICD-10-CM

## 2020-08-06 DIAGNOSIS — R791 Abnormal coagulation profile: Secondary | ICD-10-CM

## 2020-08-06 DIAGNOSIS — R062 Wheezing: Secondary | ICD-10-CM

## 2020-08-06 LAB — MAGNESIUM: Magnesium: 2.6 mg/dL — ABNORMAL HIGH (ref 1.7–2.4)

## 2020-08-06 LAB — TYPE AND SCREEN
ABO/RH(D): O POS
Antibody Screen: NEGATIVE
Unit division: 0

## 2020-08-06 LAB — BASIC METABOLIC PANEL
Anion gap: 16 — ABNORMAL HIGH (ref 5–15)
BUN: 24 mg/dL — ABNORMAL HIGH (ref 8–23)
CO2: 20 mmol/L — ABNORMAL LOW (ref 22–32)
Calcium: 8.3 mg/dL — ABNORMAL LOW (ref 8.9–10.3)
Chloride: 114 mmol/L — ABNORMAL HIGH (ref 98–111)
Creatinine, Ser: 1.09 mg/dL (ref 0.61–1.24)
GFR calc Af Amer: >60 mL/min (ref >60)
GFR calc non Af Amer: >60 mL/min (ref >60)
Glucose, Bld: 102 mg/dL — ABNORMAL HIGH (ref 70–99)
Potassium: 4.3 mmol/L (ref 3.5–5.1)
Sodium: 150 mmol/L — ABNORMAL HIGH (ref 135–145)

## 2020-08-06 LAB — ECHOCARDIOGRAM COMPLETE
AV Mean grad: 9 mmHg
AV Peak grad: 14.3 mmHg
Ao pk vel: 1.89 m/s
Height: 71 in
Weight: 3680 oz

## 2020-08-06 LAB — CBC
HCT: 32.2 % — ABNORMAL LOW (ref 39.0–52.0)
Hemoglobin: 10.3 g/dL — ABNORMAL LOW (ref 13.0–17.0)
MCH: 28.9 pg (ref 26.0–34.0)
MCHC: 32 g/dL (ref 30.0–36.0)
MCV: 90.2 fL (ref 80.0–100.0)
Platelets: 139 10*3/uL — ABNORMAL LOW (ref 150–400)
RBC: 3.57 MIL/uL — ABNORMAL LOW (ref 4.22–5.81)
RDW: 18.4 % — ABNORMAL HIGH (ref 11.5–15.5)
WBC: 11 10*3/uL — ABNORMAL HIGH (ref 4.0–10.5)
nRBC: 0 % (ref 0.0–0.2)

## 2020-08-06 LAB — PROTIME-INR
INR: 3.3 — ABNORMAL HIGH (ref 0.8–1.2)
Prothrombin Time: 32.6 seconds — ABNORMAL HIGH (ref 11.4–15.2)

## 2020-08-06 LAB — GAMMA GT: GGT: 69 U/L — ABNORMAL HIGH (ref 7–50)

## 2020-08-06 LAB — BPAM RBC
Blood Product Expiration Date: 202109232359
ISSUE DATE / TIME: 202109111439
Unit Type and Rh: 5100

## 2020-08-06 LAB — BRAIN NATRIURETIC PEPTIDE: B Natriuretic Peptide: 259.2 pg/mL — ABNORMAL HIGH (ref 0.0–100.0)

## 2020-08-06 MED ORDER — SODIUM CHLORIDE 0.9 % IV SOLN
200.0000 mg | Freq: Once | INTRAVENOUS | Status: AC
  Administered 2020-08-06: 200 mg via INTRAVENOUS
  Filled 2020-08-06: qty 10

## 2020-08-06 MED ORDER — WARFARIN SODIUM 1 MG PO TABS
1.5000 mg | ORAL_TABLET | Freq: Once | ORAL | Status: AC
  Administered 2020-08-06: 1.5 mg via ORAL
  Filled 2020-08-06: qty 1

## 2020-08-06 MED ORDER — TRAZODONE HCL 50 MG PO TABS
50.0000 mg | ORAL_TABLET | Freq: Every day | ORAL | Status: DC
  Administered 2020-08-06 – 2020-08-09 (×4): 50 mg via ORAL
  Filled 2020-08-06 (×4): qty 1

## 2020-08-06 NOTE — Anesthesia Postprocedure Evaluation (Signed)
Anesthesia Post Note  Patient: [name]  Procedure(s) Performed: COLONOSCOPY WITH PROPOFOL (N/A )  Patient location during evaluation: Endoscopy Anesthesia Type: General Level of consciousness: awake and alert Pain management: pain level controlled Vital Signs Assessment: post-procedure vital signs reviewed and stable Respiratory status: spontaneous breathing, nonlabored ventilation and respiratory function stable Cardiovascular status: blood pressure returned to baseline and stable Postop Assessment: no apparent nausea or vomiting Anesthetic complications: no   No complications documented.   Last Vitals:  Vitals:   08/06/20 0440 08/06/20 0445  BP: (!) 131/115 (!) 155/82  Pulse: (!) 103 (!) 108  Resp: 20 20  Temp: 37 C 37 C  SpO2: 97% 98%    Last Pain:  Vitals:   08/05/20 2010  TempSrc: Oral  PainSc:                  Christia Reading

## 2020-08-06 NOTE — Progress Notes (Signed)
PROGRESS NOTE    [name]   UUV:[id number]  DOB: 1931-02-17  PCP: Gracelyn NurseJohnston, [name], MD    DOA: 07/29/2020 LOS: 8   Brief Narrative   [name] is a 86 y.o. M with dCHF, HTN, CAD, AS s/p St Jude AVR 2002 on coumadin, tricuspid regurg, chronic anemia, Cdiff remotely, and PUD who presented to the ED on 07/29/20 from his SNF with confusion, shortness of breath and found to be hypoxic by SNF staff.   In the ER, SpO2 < 90%, tachypneic, severely anemic with Hgb 4 g/dL, supratherapeutic INR >47>10, and rectal exam positive for blood.   Given PRBCs x2 units, PCCs, vitamin K and FFP.  Started on PPI, admitted to hospitalist service with GI consulted.     Assessment & Plan   Principal Problem:   Acute blood loss anemia Active Problems:   Depression   Aortic stenosis   History of mechanical aortic valve replacement   Chronic diastolic CHF (congestive heart failure) (HCC)   H/O atrial flutter   Lactic acidosis   Hemorrhagic shock (HCC)   Pressure injury of skin  Acute metabolic encephalopathy / Delirium - persistent since 9/8 (day 3 of admission).  Mildly improved today 9/12.  Yesterday was persistently altered, today is having some moments of lucidity, follows commands, oriented at times. --UA and Blood cx pending - rule out infection --CXR's have been negative for PNA, more consistent with pulmonary edema but stable --Ammonia level normal --No CIWA needed, patient NOT a heavy/regular drinker --Consulted neurology for further evaluation --Delirium precautions:     -Lights and TV off, minimize interruptions at night    -Blinds open and lights on during day    -Glasses/hearing aid with patient    -Frequent reorientation    -PT/OT when able    -Avoid sedation medications/Beers list medications   Wheezing / Dyspnea - patient having intermittent episodes of Cheyne-stokes-like breathing pattern.  However, he has no expiratory wheezes or crackles on lung auscultation.  O2  requirement has remained stable.  This seems related to his encephalopathy, not cardio/pulmonary etiology at this point. --ABG showed normal pCO2, good air movement on exam and no expiratory wheezes, not likely bronchospasm --no crackles on exam when pt calm, hypernatremia, elevated BUN, no edema, dry mucosal membranes - clinically appears dry despite cxr appearance.  BNP is lower than several days ago.   --Inaccurate I/O's - Net fluid balance is unknown, outputs are not being captured consistently as patient is incontinent of urine.   --Avoid further lasix for now. --Continue gentle D5-1/2NS for volume and hypernatremia --negative procal and lactic acid, no leukocytosis on 9/11  --monitor for signs of infection  --stopped gentle D5W for his hypernatremia   Anorexia - due to GI procedures and ongoing confusion/delirium, patient has not eaten in several days.  Family expressing concern, what plan would be if he does not start to eat. Discussed NG tube vs PEG tube and artificial feeds.  Explained these would both increase his confusion and agitation.   Monitor intake.  Assist with feeding.  Encourage frequent PO intake, drink water.    Acute GI bleed - likely diverticular bleeding Acute blood loss anemia superimposed on chronic iron deficiency anemia Acquired thrombophilia with supratherapeutic INR INR > 10 at admission.  Given PCC, phytonadione and FFP at admission.  Admitted and started on PPI. EGD 9/5 by Dr. Norma Fredricksonoledo, no ulcers or other bleeding source was found. Colonoscopy 9/9 by Dr. Norma Fredricksonoledo - fair prep, diverticulosis on  sigmoid and descending [name], blood in sigmoid [name], non-bleeding internal hemorrhoids. 9/9 - ongoing melena per nursing 9/10-11 - no reports of melena or hematochezia 9/11 - transfused 1 unit RBC's for Hbg 7.5 --stat H&H --resumed on heparin gtt >> bridge warfarin  --GI following -Trend Hgb, transfuse for Hbg < 8 given CHF and cardiogenic shock at  presentation --Venofer today   Hypernatremia - Na 148>>149>>148 this AM, due to NPO status for procedure, also poor PO intake likely due to confusion/delirium.  Expect improvement with resumption of diet, but pt having poor oral intake due to his confusion. --Continue gentle D5-1/2NS at 50 cc/hr --BMP in AM --encourage pt to drink water / PO intake --assist with feeding patient   Acute on chronic diastolic CHF - POA. Resolved. Hypertension Coronary artery disease, secondary prevention On admission, CXR showed edema, pt was hypoxic 9/5.   Improved with Lasix which was stopped.   Lasix was again given 9/10-11 due to signs of respiratory distress and cxr w/edema. 9/12 Echo - EF 60-65%, mildly elevate PASP, mild MR, mod TR, mechanical aortic valve --Avoid further Lasix for now, patient is dry -Continue metoprolol -Hold clonidine, doxazosin, Imdur due to low BP --Home torsemide and metolazone  --Monitor volume status closely --Cardiology consulted    History of aortic stenosis s/p St Jude aortic valve  Supratherapeutic INR - POA, INR > 10.  Target INR 2.5-3.5.  Patient's INR was reversed on admission. INR therapeutic x 2 days.   -Stop heparin -Continue warfarin per pharmacy --INR goal 2.5-3.5   Abdominal distension, possible very mild ileus - Resolved. S/p bowel prep 9/5 evening, and actually didn't have bowel movements for >12 hours.   Monitor abdominal exam and stool output.   Hypokalemia - replaced.  Monitor K and Mg, replace as needed.   Restless leg syndrome - continue ropinirole  Gout - hold allopurinol for now   Depression - hold duloxetine  BPH - hold finasteride   Lower extremity venous stasis wounds - bilateral, POA. WOC consulted - recommends Cleanse lower legs with NS and pat dry Apply Santyl to open wounds  Cover with NS moist 2x2.  Secure with dry dressing and kerlix/tape. Change daily.   Plan to wrap with compression on discharge.   Pressure  injury of sacrum, stage 2 - present on admission  Pressure Injury 08/02/20 Sacrum Stage 2 -  Partial thickness loss of dermis presenting as a shallow open injury with a red, pink wound bed without slough. (Active)  08/02/20 1600  Location: Sacrum  Location Orientation:   Staging: Stage 2 -  Partial thickness loss of dermis presenting as a shallow open injury with a red, pink wound bed without slough.  Wound Description (Comments):   Present on Admission:      DVT prophylaxis: SCDs Start: 07/29/20 1352   Diet:  Diet Orders (From admission, onward)    Start     Ordered   08/03/20 1400  DIET SOFT Room service appropriate? Yes; Fluid consistency: Thin  Diet effective now       Question Answer Comment  Room service appropriate? Yes   Fluid consistency: Thin      08/03/20 1359            Code Status: Full Code    Subjective 08/06/20    Patient seen with son at bedside today.  Sitter present since around 2:30 am.  Overnight, continued to have intermittent spells of agitation, pulling at lines, gown etc.  Episodes of gasping breaths  are less frequent and more mild today.  He has had some moments of lucidity this morning, which is improvement.  Able to tell me his name and follow some commands.    Reviewed all recent labs etc with son at bedside.   Disposition Plan & Communication   Status is: Inpatient  Remain Inpatient Appropriate because severity of illness requiring IV medications, awaiting therapeutic INR x 48 hours.    Dispo: The patient is from: SNF              Anticipated d/c is to: SNF              Anticipated d/c date is: 2 days              Patient currently is not medically stable for discharge.   Family Communication: son was at bedside during encounter today, 9/12   Consults, Procedures, Significant Events   Consultants:   Gastroenterology  Neurology  Cardiology  Procedures:   EGD 07/30/20  Echo 9/11   Objective   Vitals:   08/06/20 0440  08/06/20 0445 08/06/20 0448 08/06/20 1346  BP: (!) 131/115 (!) 155/82  (!) 182/48  Pulse: (!) 103 (!) 108  87  Resp: 20 20  20   Temp: 98.6 F (37 C) 98.6 F (37 C)  (!) 97.5 F (36.4 C)  TempSrc:    Axillary  SpO2: 97% 98%  (!) 86%  Weight:   104.3 kg   Height:        Intake/Output Summary (Last 24 hours) at 08/06/2020 1354 Last data filed at 08/06/2020 0930 Gross per 24 hour  Intake 814.06 ml  Output --  Net 814.06 ml   Filed Weights   08/03/20 1144 08/05/20 0445 08/06/20 0448  Weight: 92.2 kg 108.4 kg 104.3 kg    Physical Exam:  General exam: remains confused, alternates between alert with mild agitation, then becomes lethargic Respiratory system: CTAB when patient calm, no wheezes or crackles appreciated, no rhonchi Cardiovascular system:  RRR, normal S1/S2, no pedal edema, 2/6 systolic murmur, mech valve click.   Central nervous system: intermittently somnolent/lethargic, moments of lucidity today, no gross focal neurologic deficits, normal speech, follows some commands intermittently when awake Extremities: moves all, venous stasis of LE's, no edema   Labs   Data Reviewed: I have personally reviewed following labs and imaging studies  CBC: Recent Labs  Lab 08/02/20 0511 08/02/20 0511 08/03/20 0449 08/03/20 1454 08/04/20 0517 08/05/20 0635 08/05/20 1901 08/05/20 2131 08/06/20 0624  WBC 9.9  --  9.9  --  9.9 10.9*  --   --  11.0*  HGB 7.6*   < > 8.2*   < > 8.4* 7.5* 9.2* 9.4* 10.3*  HCT 23.1*   < > 25.7*   < > 26.1* 25.4* 29.8* 30.3* 32.2*  MCV 91.3  --  93.1  --  90.0 95.8  --   --  90.2  PLT 241  --  275  --  284 266  --   --  139*   < > = values in this interval not displayed.   Basic Metabolic Panel: Recent Labs  Lab 08/02/20 0511 08/03/20 0449 08/04/20 0517 08/05/20 0635 08/06/20 0624  NA 142 148* 149* 148* 150*  K 3.2* 3.5 3.5 3.8 4.3  CL 109 111 112* 113* 114*  CO2 27 26 26 23  20*  GLUCOSE 112* 105* 88 105* 102*  BUN 49* 36* 29* 27* 24*   CREATININE 1.08 1.00 1.07 1.17 1.09  CALCIUM 8.1* 8.5* 8.3* 8.1* 8.3*  MG  --  3.0*  --  2.5* 2.6*   GFR: Estimated Creatinine Clearance: 59.8 mL/min (by C-G formula based on SCr of 1.09 mg/dL). Liver Function Tests: Recent Labs  Lab 08/05/20 1901  AST 33  ALT 21   No results for input(s): LIPASE, AMYLASE in the last 168 hours. Recent Labs  Lab 08/05/20 1901  AMMONIA 11   Coagulation Profile: Recent Labs  Lab 08/02/20 0511 08/03/20 0449 08/04/20 0517 08/05/20 0635 08/06/20 1239  INR 2.4* 2.1* 2.5* 3.1* 3.3*   Cardiac Enzymes: No results for input(s): CKTOTAL, CKMB, CKMBINDEX, TROPONINI in the last 168 hours. BNP (last 3 results) No results for input(s): PROBNP in the last 8760 hours. HbA1C: No results for input(s): HGBA1C in the last 72 hours. CBG: Recent Labs  Lab 08/02/20 1624 08/02/20 2031 08/03/20 2040 08/04/20 0730 08/04/20 2337  GLUCAP 99 110* 108* 94 138*   Lipid Profile: No results for input(s): CHOL, HDL, LDLCALC, TRIG, CHOLHDL, LDLDIRECT in the last 72 hours. Thyroid Function Tests: No results for input(s): TSH, T4TOTAL, FREET4, T3FREE, THYROIDAB in the last 72 hours. Anemia Panel: Recent Labs    08/05/20 0635 08/05/20 1103  VITAMINB12  --  828  FOLATE 11.0  --   FERRITIN 60  --   TIBC 325  --   IRON 20*  --   RETICCTPCT 7.7*  --    Sepsis Labs: Recent Labs  Lab 08/05/20 1222 08/05/20 1901  PROCALCITON <0.10  --   LATICACIDVEN 1.5 1.4    Recent Results (from the past 240 hour(s))  SARS Coronavirus 2 by RT PCR (hospital order, performed in [facility] hospital lab) Nasopharyngeal Nasopharyngeal Swab     Status: None   Collection Time: 07/29/20  9:54 AM   Specimen: Nasopharyngeal Swab  Result Value Ref Range Status   SARS Coronavirus 2 NEGATIVE NEGATIVE Final    Comment: (NOTE) SARS-CoV-2 target nucleic acids are NOT DETECTED.  The SARS-CoV-2 RNA is generally detectable in upper and lower respiratory specimens during the acute  phase of infection. The lowest concentration of SARS-CoV-2 viral copies this assay can detect is 250 copies / mL. A negative result does not preclude SARS-CoV-2 infection and should not be used as the sole basis for treatment or other patient management decisions.  A negative result may occur with improper specimen collection / handling, submission of specimen other than nasopharyngeal swab, presence of viral mutation(s) within the areas targeted by this assay, and inadequate number of viral copies (<250 copies / mL). A negative result must be combined with clinical observations, patient history, and epidemiological information.  Fact Sheet for Patients:   BoilerBrush.com.cy  Fact Sheet for Healthcare Providers: https://pope.com/  This test is not yet approved or  cleared by the [place] FDA and has been authorized for detection and/or diagnosis of SARS-CoV-2 by FDA under an Emergency Use Authorization (EUA).  This EUA will remain in effect (meaning this test can be used) for the duration of the COVID-19 declaration under Section 564(b)(1) of the Act, 21 U.S.C. section 360bbb-3(b)(1), unless the authorization is terminated or revoked sooner.  Performed at [facility], [address] [place], [place] [address]   Blood culture (routine single)     Status: None   Collection Time: 07/29/20  9:54 AM   Specimen: BLOOD  Result Value Ref Range Status   Specimen Description BLOOD RIGHT HAND  Final   Special Requests   Final  BOTTLES DRAWN AEROBIC AND ANAEROBIC Blood Culture adequate volume   Culture   Final    NO GROWTH 5 DAYS Performed at [facility], [address] [place], [place] [address]    Report Status 08/03/2020 FINAL  Final  Urine culture     Status: None   Collection Time: 07/29/20  9:57 AM   Specimen: Urine, Random  Result Value Ref Range Status   Specimen Description   Final    URINE,  RANDOM Performed at [facility], [address], [place], [place] [address]    Special Requests   Final    NONE Performed at [facility], [address] [place], [place] [address]    Culture   Final    NO GROWTH Performed at [facility], [address], [place], [place] [address]    Report Status 07/31/2020 FINAL  Final  MRSA PCR Screening     Status: None   Collection Time: 07/31/20  8:25 PM   Specimen: Nasal Mucosa; Nasopharyngeal  Result Value Ref Range Status   MRSA by PCR NEGATIVE NEGATIVE Final    Comment:        The GeneXpert MRSA Assay (FDA approved for NASAL specimens only), is one component of a comprehensive MRSA colonization surveillance program. It is not intended to diagnose MRSA infection nor to guide or monitor treatment for MRSA infections. Performed at [facility], [address] [place], [place] [address]   Culture, blood (single) w Reflex to ID Panel     Status: None (Preliminary result)   Collection Time: 08/05/20 12:22 PM   Specimen: BLOOD  Result Value Ref Range Status   Specimen Description BLOOD RIGHT ANTECUBITAL  Final   Special Requests   Final    BOTTLES DRAWN AEROBIC AND ANAEROBIC Blood Culture results may not be optimal due to an excessive volume of blood received in culture bottles   Culture   Final    NO GROWTH < 24 HOURS Performed at [facility], [address], [place], [place] [address]    Report Status PENDING  Incomplete      Imaging Studies   DG Chest 1 View  Result Date: 08/04/2020 CLINICAL DATA:  Wheezing. EXAM: CHEST  1 VIEW COMPARISON:  07/31/2020. FINDINGS: Median sternotomy with stable cardiomegaly. Aortic valve replacement. There is increasing peribronchial thickening a possible developing Kerley B-lines. Suspected small pleural effusions. No pneumothorax or confluent airspace disease. Stable sclerotic density projecting over the left scapula likely bone island.  IMPRESSION: Cardiomegaly with increasing peribronchial thickening and possible developing Kerley B-lines, suspicious for pulmonary edema. Small pleural effusions. Overall findings suspicious for CHF. Electronically Signed   By: Narda Rutherford M.D.   On: 08/04/2020 21:36   DG Chest Port 1 View  Result Date: 08/06/2020 CLINICAL DATA:  Hypoxia.  Colonoscopy earlier in the day EXAM: PORTABLE CHEST 1 VIEW COMPARISON:  August 04, 2020 FINDINGS: There is cardiomegaly with pulmonary venous hypertension. There is mild interstitial edema. There is no consolidation. Patient is status post aortic valve replacement. There is aortic atherosclerosis. No adenopathy. No pneumoperitoneum evident portable radiographic examination. IMPRESSION: Cardiomegaly with pulmonary vascular congestion. Mild interstitial edema. There may be volume overload/congestive heart failure. No consolidation. Status post aortic valve replacement. Aortic Atherosclerosis (ICD10-I70.0). Electronically Signed   By: Bretta Bang III M.D.   On: 08/06/2020 09:25   ECHOCARDIOGRAM COMPLETE  Result Date: 08/06/2020    ECHOCARDIOGRAM REPORT   Patient Name:   TABER SWEETSER [facility]  Date of Exam: 08/06/2020 Medical Rec #:  [id number]            Height:       71.0 in Accession #:    [id number]           Weight:       230.0 lb Date of Birth:  [birth year]           BSA:          2.238 m Patient Age:    86 years             BP:           155/82 mmHg Patient Gender: M                    HR:           108 bpm. Exam Location:  [facility] Procedure: 2D Echo Indications:     Dyspnea R06.00 s/p AVR  History:         Patient has prior history of Echocardiogram examinations, most                  recent 02/11/2020.  Sonographer:     Wonda Cerise RDCS Referring Phys:  [id number] Tresa Endo A Mikenna Bunkley Diagnosing Phys: Julien Nordmann MD  Sonographer Comments: Technically challenging study due to limited acoustic windows, Technically difficult study due to poor echo windows,  suboptimal parasternal window, suboptimal apical window and suboptimal subcostal window. Image acquisition challenging due to patient behavioral factors. and Image acquisition challenging due to uncooperative patient. IMPRESSIONS  1. Left ventricular ejection fraction, by estimation, is 60 to 65%. The left ventricle has normal function. The left ventricle has no regional wall motion abnormalities. Left ventricular diastolic parameters are indeterminate.  2. Right ventricular systolic function is normal. The right ventricular size is mildly enlarged. There is mildly elevated pulmonary artery systolic pressure. The estimated right ventricular systolic pressure is 39.2 mmHg.  3. Mild mitral valve regurgitation.  4. Tricuspid valve regurgitation is moderate.  5. The aortic valve was not well visualized. Mechanical aortic valve. No aortic stenosis is present. Aortic valve mean gradient measures 9.0 mmHg.  6. Challenging images FINDINGS  Left Ventricle: Left ventricular ejection fraction, by estimation, is 60 to 65%. The left ventricle has normal function. The left ventricle has no regional wall motion abnormalities. The left ventricular internal cavity size was normal in size. There is  no left ventricular hypertrophy. Left ventricular diastolic parameters are indeterminate. Right Ventricle: The right ventricular size is mildly enlarged. No increase in right ventricular wall thickness. Right ventricular systolic function is normal. There is mildly elevated pulmonary artery systolic pressure. The tricuspid regurgitant velocity is 2.70 m/s, and with an assumed right atrial pressure of 10 mmHg, the estimated right ventricular systolic pressure is 39.2 mmHg. Left Atrium: Left atrial size was normal in size. Right Atrium: Right atrial size was normal in size. Pericardium: There is no evidence of pericardial effusion. Mitral Valve: The mitral valve is normal in structure. Mild mitral valve regurgitation. No evidence of mitral  valve stenosis. Tricuspid Valve: The tricuspid valve is normal in structure. Tricuspid valve regurgitation is moderate . No evidence of tricuspid stenosis. Aortic Valve: The aortic valve was not well visualized. Aortic valve regurgitation is not visualized. No aortic stenosis is present. Aortic valve mean gradient measures 9.0 mmHg. Aortic valve peak gradient measures 14.3 mmHg. There is a mechanical valve present in the aortic position. Pulmonic Valve: The pulmonic valve was normal  in structure. Pulmonic valve regurgitation is not visualized. No evidence of pulmonic stenosis. Aorta: The aortic root is normal in size and structure. Venous: The inferior vena cava is normal in size with greater than 50% respiratory variability, suggesting right atrial pressure of 3 mmHg. IAS/Shunts: No atrial level shunt detected by color flow Doppler.  AORTIC VALVE AV Vmax:      189.00 cm/s AV Vmean:     135.000 cm/s AV VTI:       0.274 m AV Peak Grad: 14.3 mmHg AV Mean Grad: 9.0 mmHg TRICUSPID VALVE TV Peak grad:   29.2 mmHg TV Vmax:        2.70 m/s TR Peak grad:   29.2 mmHg TR Vmax:        270.00 cm/s Julien Nordmann MD Electronically signed by Julien Nordmann MD Signature Date/Time: 08/06/2020/12:34:27 PM    Final (Updated)      Medications   Scheduled Meds: . collagenase   Topical Daily  . DULoxetine  30 mg Oral Daily  . folic acid  1 mg Oral Daily  . ipratropium-albuterol  3 mL Nebulization Q6H WA  . metoprolol succinate  12.5 mg Oral Daily  .  morphine injection  1 mg Intravenous Once  . multivitamin with minerals  1 tablet Oral Daily  . rOPINIRole  2 mg Oral QID  . thiamine  100 mg Oral Daily  . warfarin  1.5 mg Oral Once  . warfarin  1.5 mg Oral ONCE-1600  . Warfarin - Pharmacist Dosing Inpatient   Does not apply q1600   Continuous Infusions: . dextrose 5 % and 0.45% NaCl 50 mL/hr at 08/06/20 0402       LOS: 8 days    Time spent: 35 minutes with > 50% spent in coordination of care and direct patient  contact.    Pennie Banter, DO [facility]  08/06/2020, 1:54 PM    If 7PM-7AM, please contact night-coverage. How to contact the [facility] Attending or Consulting provider 7A - 7P or covering provider during after hours 7P -7A, for this patient?    1. Check the care team in [facility] and look for a) attending/consulting [facility] provider listed and b) the [facility] team listed 2. Log into [email or web address] and use [facility]'s universal password to access. If you do not have the password, please contact the hospital operator. 3. Locate the [facility] provider you are looking for under [facility] and page to a number that you can be directly reached. 4. If you still have difficulty reaching the provider, please page the [facility] (Director on Call) for the Hospitalists listed on amion for assistance. 0

## 2020-08-06 NOTE — Progress Notes (Addendum)
ANTICOAGULATION CONSULT NOTE  Pharmacy Consult for Warfarin Indication: aortic stenosis s/p St Jude mechanical AVR (2002)  Patient Measurements: Height: 5\' 11"  (180.3 cm) Weight: 104.3 kg (230 lb) IBW/kg (Calculated) : 75.3  Heparin DW: 94.8 kg  Vital Signs: Temp: 97.5 F (36.4 C) (09/12 1346) BP: 182/48 (09/12 1346) Pulse Rate: 87 (09/12 1346)  Labs: Recent Labs    08/04/20 0517 08/04/20 0517 08/04/20 1402 08/05/20 0635 08/05/20 0635 08/05/20 1901 08/05/20 1901 08/05/20 2131 08/06/20 0624 08/06/20 1239  HGB 8.4*   < >  --  7.5*   < > 9.2*   < > 9.4* 10.3*  --   HCT 26.1*   < >  --  25.4*   < > 29.8*  --  30.3* 32.2*  --   PLT 284  --   --  266  --   --   --   --  139*  --   LABPROT 25.8*  --   --  30.8*  --   --   --   --   --  32.6*  INR 2.5*  --   --  3.1*  --   --   --   --   --  3.3*  HEPARINUNFRC 0.31  --  0.43 0.35  --   --   --   --   --   --   CREATININE 1.07  --   --  1.17  --   --   --   --  1.09  --   TROPONINIHS  --   --   --   --   --  32*  --  36*  --   --    < > = values in this interval not displayed.    Estimated Creatinine Clearance: 59.8 mL/min (by C-G formula based on SCr of 1.09 mg/dL).   Medical History: Past Medical History:  Diagnosis Date  . Anxiety 10/11  . Aortic stenosis    a. s/p mechcanical AVR, 2002; b. 10/2018 Echo: Triv AI, mean grad 11/2018.  . Bradycardia    chronic, no symptoms 07/2010  . C. difficile colitis   . Carotid bruit    dopplers in past, no abnormalities  . Chronic diastolic CHF (congestive heart failure) (HCC)    a. Echo 03/2015: EF 60-65%, no RWMA, GR1DD, mild BAE, mild to mod MR, mod TR, PASP 65 mmHg; b. 01/2017 Echo: EF 55-60%, NRWMA, grade 1 diastolic dysfunction.  Normal functioning prosthetic aortic valve.  Mean gradient 50 mmHg.  Sev TR. PASP 02/2017; c. 10/2018 Echo: EF 55-60%, Triv AI, mod dil LA, mod-sev TR, PASP 35-40, mild to mod red RV fxn.  . Coronary artery disease    a. mild, cath, 08/2010; b.  medically managed  . Decreased hearing    Right ear  . Depression   . Gastric ulcer   . GERD (gastroesophageal reflux disease)   . Hypertension    BP higher than usual 04/19/10; amlodipine increased by telephone  . Mod-Sev Tricuspid regurgitation    a. 10/2018 Echo: Mod-Sev TR, PASP 35-52mmHg.  43m RLS (restless legs syndrome) 08/23/2015  . S/P AVR    a. St. Jude. mechanical 2002; b. echo 08/2010 EF 60%, trival AI, mild MR, AVR working well; c. on longterm warfarin tx  . SOB (shortness of breath) 10/11   08/2010,Episodes at 5 AM, eventually felt to be anxiety, after complete workup including catheterization, pt greatly improved with anxiety meds 11/11    Medications:  PTA warfarin 5mg  daily  Assessment: Pharmacy has been consulted for warfarin dosing and monitoring in an 86yo male with a PMH significant for aortic stenosis s/p St. Jude mechanical AVR in 2002 (on Coumadin), chronic diastolic HF, HTN, CAD, chronic venous stasis. Upon admission patient Hgb 4, Hct 14, and INR >10; rectal exam revealed heme positive stools. Patient received 2 units pRBC, 10mg  IV vitamin K x1, Kcentra, and 1 unit FFP. H&H has improved since admission.  EGD today did not identify source of bleeding. Colonoscopy was performed 08/03/20 and heparin was held during this procedure  warfarin course:  Date   INR Dose 09/04   1.5 NONE 09/05   1.4 2.5 mg 09/06   1.5 2.5 mg 09/07   2.1 NONE  (2.5 mg vit K po) 09/08   2.4 NONE 09/09   2.1 2.5 mg 09/10   2.5 1 mg   09/11   3.1 1.5mg  (ordered but held per pt lethargy/unsafe po) 09/12   3.3  Goal of Therapy:  INR 2.5-3.5 (per Coumadin clinic) Heparin level 0.3 - 0.7 IU/mL Monitor platelets by anticoagulation protocol: Yes   Plan:   Warfarin 1.5 mg po x 1 (dose reduced due to sensitive response and supratherapeutic admission INR on home dose of 5mg )   Monitor plt carefully, drop from 266>139 today  CBC, INR in am  11/12, PharmD, BCPS Clinical  Pharmacist 08/06/2020 1:47 PM

## 2020-08-06 NOTE — Progress Notes (Signed)
*  PRELIMINARY RESULTS* Echocardiogram 2D Echocardiogram has been performed.  Garrel Ridgel Yenesis Even 08/06/2020, 9:56 AM

## 2020-08-06 NOTE — Consult Note (Signed)
Cardiology Consultation:   Patient ID: [name] MRN: [id number]; DOB: 1930-12-05  Admit date: 07/29/2020 Date of Consult: 08/06/2020  Primary Care Provider: Gracelyn Nurse, MD [facility] Cardiologist: Julien Nordmann, MD  Reason for consult : Shortness of breath Physician requesting consult: Esaw Grandchild   Patient Profile:   [name] is a 86 y.o. male with a hx of  h/o aortic stenosis  s/p St. Jude mechanical aortic valve replacement in 2002 on chronic Coumadin therapy,  mild CAD by cardiac cath in 2011 managed medically,  GERD,  HTN,  carotid bruits,  Chronic hyponatremia asymptomatic bradycardia,  RLS, anxiety, depression,  GI bleed OSA on CPAP osteoarthritis s/p total left hip replacement Ejection fraction greater than 55% March 2018, moderately elevated right heart pressures S/p AVR, mechanical valve on warfarin Permanent atrial fibrillation/flutter Presenting to the hospital with GI bleed, profound anemia hemoglobin 4, respiratory distress, mental status changes  History of Present Illness:   On arrival to the hospital July 29, 2020 was lethargic, hypoxic, hemoglobin of 4, hypotensive INR greater than 10 with heme positive stools Resuscitated with Kcentra, FFP, IV fluids, blood transfusions x2, vitamin K Underwent EGD with GI July 30, 2020 Has large hiatal hernia, no signs of bleed He underwent colon prep but had no bowel movements, concern for ileus, colonoscopy on hold Was bridged with heparin infusion Noted be confused at times throughout his hospital stay, nurses at times with difficulty washing him up Incontinent of stool, black in color  Diagnosed with delirium, acute metabolic encephalopathy Unable to work with physical therapy given confusion and restlessness He did not finish his bowel prep for colonoscopy Finally able to perform colonoscopy August 03, 2020 showing nonbleeding internal hemorrhoids, multiple  diverticula Poor prep making visualization difficult Is recommended by GI that he continue on his heparin warfarin  Many of his medications were held for hypotension including clonidine, Cardura, Imdur Diuretics were on hold secondary to hypotension and poor fluid intake Reported having episodes of anxiety, delirium, shortness of breath with wheezing Received some fluids for hypernatremia September 10 Has received Ativan for agitation Neurology following, felt to have delirium, EEG recommended Had additional transfusion for hemoglobin 7.5  Echocardiogram personally reviewed by myself showing normal LV function, mildly elevated right heart pressures, well-functioning aortic valve   Past Medical History:  Diagnosis Date  . Anxiety 10/11  . Aortic stenosis    a. s/p mechcanical AVR, 2002; b. 10/2018 Echo: Triv AI, mean grad .  . Bradycardia    chronic, no symptoms 07/2010  . C. difficile colitis   . Carotid bruit    dopplers in past, no abnormalities  . Chronic diastolic CHF (congestive heart failure) (HCC)    a. Echo 03/2015: EF 60-65%, no RWMA, GR1DD, mild BAE, mild to mod MR, mod TR, PASP 65 mmHg; b. 01/2017 Echo: EF 55-60%, NRWMA, grade 1 diastolic dysfunction.  Normal functioning prosthetic aortic valve.  Mean gradient 50 mmHg.  Sev TR. PASP ; c. 10/2018 Echo: EF 55-60%, Triv AI, mod dil LA, mod-sev TR, PASP 35-40, mild to mod red RV fxn.  . Coronary artery disease    a. mild, cath, 08/2010; b. medically managed  . Decreased hearing    Right ear  . Depression   . Gastric ulcer   . GERD (gastroesophageal reflux disease)   . Hypertension    BP higher than usual 04/19/10; amlodipine increased by telephone  . Mod-Sev Tricuspid regurgitation    a. 10/2018 Echo: Mod-Sev TR, PASP  35-28mmHg.  Marland Kitchen RLS (restless legs syndrome) 08/23/2015  . S/P AVR    a. St. Jude. mechanical 2002; b. echo 08/2010 EF 60%, trival AI, mild MR, AVR working well; c. on longterm warfarin tx  . SOB  (shortness of breath) 10/11   08/2010,Episodes at 5 AM, eventually felt to be anxiety, after complete workup including catheterization, pt greatly improved with anxiety meds 11/11    Past Surgical History:  Procedure Laterality Date  . CARDIAC CATHETERIZATION    . COLONOSCOPY WITH PROPOFOL N/A 08/03/2020   Procedure: COLONOSCOPY WITH PROPOFOL;  Surgeon: Toledo, Boykin Nearing, MD;  Location: [facility] ENDOSCOPY;  Service: Gastroenterology;  Laterality: N/A;  . ESOPHAGOGASTRODUODENOSCOPY N/A 04/05/2015   Procedure: ESOPHAGOGASTRODUODENOSCOPY (EGD);  Surgeon: Scot Jun, MD;  Location: [facility] ENDOSCOPY;  Service: Endoscopy;  Laterality: N/A;  . ESOPHAGOGASTRODUODENOSCOPY N/A 04/17/2015   Procedure: ESOPHAGOGASTRODUODENOSCOPY (EGD);  Surgeon: Scot Jun, MD;  Location: [facility] ENDOSCOPY;  Service: Endoscopy;  Laterality: N/A;  . ESOPHAGOGASTRODUODENOSCOPY N/A 08/02/2015   Procedure: ESOPHAGOGASTRODUODENOSCOPY (EGD);  Surgeon: Wallace Cullens, MD;  Location[facility] ENDOSCOPY;  Service: Endoscopy;  Laterality: N/A;  . ESOPHAGOGASTRODUODENOSCOPY N/A 07/30/2020   Procedure: ESOPHAGOGASTRODUODENOSCOPY (EGD);  Surgeon: Toledo, Boykin Nearing, MD;  Location: [facility] ENDOSCOPY;  Service: Gastroenterology;  Laterality: N/A;  . HERNIA REPAIR    . JOINT REPLACEMENT    . TOTAL HIP ARTHROPLASTY    . VALVE REPLACEMENT  1/02   Aortic; echo 3/09 valve working well; echo 10/11 working well; put on Coumadin     Home Medications:  Prior to Admission medications   Medication Sig Start Date End Date Taking? Authorizing Provider  Acetaminophen 500 MG capsule Take 2 capsules by mouth every 8 (eight) hours as needed.   Yes [provider]  allopurinol (ZYLOPRIM) 300 MG tablet Take 300 mg by mouth daily.  05/16/20  Yes [provider]  amoxicillin (AMOXIL) 500 MG capsule 4 CAPSULES PRIOR TO DENTAL PROCEDURES AS DIRECTED 06/27/20  Yes [provider]  Cetirizine HCl 10 MG CAPS Take 10 mg by mouth daily.   Yes  [provider]  cloNIDine (CATAPRES) 0.1 MG tablet TAKE 1 TABLET BY MOUTH  TWICE DAILY Patient taking differently: Take 0.1 mg by mouth 2 (two) times daily.  01/05/20  Yes Antonieta Iba, MD  doxazosin (CARDURA) 8 MG tablet Take 8 mg by mouth 2 (two) times daily.  02/09/20  Yes [provider]  DULoxetine (CYMBALTA) 30 MG capsule Take 1 capsule (30 mg total) by mouth daily. 07/15/20  Yes Meredeth Ide, MD  finasteride (PROSCAR) 5 MG tablet Take 5 mg by mouth daily.     Yes [provider]  isosorbide mononitrate (IMDUR) 60 MG 24 hr tablet Take 1 tablet (60 mg total) by mouth 2 (two) times daily. 05/05/20  Yes Creig Hines, NP  metolazone (ZAROXOLYN) 2.5 MG tablet Take 1 tablet (2.5 mg total) by mouth 2 (two) times a week. 02/17/20 07/29/20 Yes Nachmen Mansel, Tollie Pizza, MD  metoprolol succinate (TOPROL-XL) 25 MG 24 hr tablet Take 0.5 tablets (12.5 mg total) by mouth daily. 03/06/20  Yes Tess Potts, Tollie Pizza, MD  potassium chloride (KLOR-CON) 10 MEQ tablet TAKE 3 TABLETS (30 MEQ) BY MOUTH DAILY. TAKE AN ADDITIONAL 2TABS ON THE DAYS YOU TAKE METOLAZONE 06/08/20  Yes Jirah Rider, Tollie Pizza, MD  rOPINIRole (REQUIP) 2 MG tablet TAKE 1 TABLET BY MOUTH 4  TIMES DAILY Patient taking differently: Take 2 mg by mouth in the morning, at noon, in the evening, and  at bedtime. Take 1 tablet by mouth 4  times daily 09/14/18  Yes Millikan, Aundra Millet, NP  torsemide (DEMADEX) 20 MG tablet Take 2 tablets (40 mg total) by mouth daily. Take additional 40 mg for 3 pounds weight gain . 07/14/20  Yes Meredeth Ide, MD  traZODone (DESYREL) 50 MG tablet Take 50 mg by mouth at bedtime.   Yes [provider]  warfarin (COUMADIN) 5 MG tablet Take 5 mg by mouth daily. Takes daily at 1700   Yes [provider]    Inpatient Medications: Scheduled Meds: . collagenase   Topical Daily  . DULoxetine  30 mg Oral Daily  . folic acid  1 mg Oral Daily  . ipratropium-albuterol  3 mL Nebulization Q6H WA   . metoprolol succinate  12.5 mg Oral Daily  .  morphine injection  1 mg Intravenous Once  . multivitamin with minerals  1 tablet Oral Daily  . rOPINIRole  2 mg Oral QID  . thiamine  100 mg Oral Daily  . warfarin  1.5 mg Oral Once  . Warfarin - Pharmacist Dosing Inpatient   Does not apply q1600   Continuous Infusions: . dextrose 5 % and 0.45% NaCl 50 mL/hr at 08/06/20 0402   PRN Meds: ondansetron **OR** ondansetron (ZOFRAN) IV, traZODone  Allergies:    Allergies  Allergen Reactions  . Sulfa Antibiotics Other (See Comments)    Reaction:  Unknown   . Levofloxacin Nausea Only    Social History:   Social History   Socioeconomic History  . Marital status: Married    Spouse name: Not on file  . Number of children: 3  . Years of education: some coll.  . Highest education level: Not on file  Occupational History  . Occupation: Retired    Associate Professor: RETIRED  Tobacco Use  . Smoking status: Former Smoker    Types: Cigarettes    Quit date: 1979    Years since quitting: 42.7  . Smokeless tobacco: Never Used  Vaping Use  . Vaping Use: Never used  Substance and Sexual Activity  . Alcohol use: Yes    Alcohol/week: 3.0 standard drinks    Types: 3 Glasses of wine per week  . Drug use: No  . Sexual activity: Never  Other Topics Concern  . Not on file  Social History Narrative   No regular exercise.   Patient lives at [facility]. His wife lives in same facility but she lives in the assisted living part.      Patient drinks 1-2 cups of caffeine daily.   Patient is right handed.   Social Determinants of Health   Financial Resource Strain:   . Difficulty of Paying Living Expenses: Not on file  Food Insecurity:   . Worried About Programme researcher, broadcasting/film/video in the Last Year: Not on file  . Ran Out of Food in the Last Year: Not on file  Transportation Needs:   . Lack of Transportation (Medical): Not on file  . Lack of Transportation (Non-Medical): Not on file  Physical Activity:   .  Days of Exercise per Week: Not on file  . Minutes of Exercise per Session: Not on file  Stress:   . Feeling of Stress : Not on file  Social Connections:   . Frequency of Communication with Friends and Family: Not on file  . Frequency of Social Gatherings with Friends and Family: Not on file  . Attends Religious Services: Not on file  . Active Member of Clubs  or Organizations: Not on file  . Attends Banker Meetings: Not on file  . Marital Status: Not on file  Intimate Partner Violence:   . Fear of Current or Ex-Partner: Not on file  . Emotionally Abused: Not on file  . Physically Abused: Not on file  . Sexually Abused: Not on file    Family History:    Family History  Problem Relation Age of Onset  . Hypertension Mother   . Diabetes type II Mother   . Heart disease Mother   . Heart attack Mother   . Hypertension Father   . Heart disease Father   . Stroke Father   . Stroke Brother   . Stroke Brother   . Hypertension Other      ROS:  Please see the history of present illness.  Review of Systems  Constitutional: Negative.   HENT: Negative.   Respiratory: Negative.   Cardiovascular: Negative.   Gastrointestinal: Negative.   Musculoskeletal: Negative.   Neurological: Negative.   Psychiatric/Behavioral: Negative.   All other systems reviewed and are negative.    Physical Exam/Data:   Vitals:   08/05/20 2033 08/06/20 0440 08/06/20 0445 08/06/20 0448  BP:  (!) 131/115 (!) 155/82   Pulse:  (!) 103 (!) 108   Resp:  20 20   Temp:  98.6 F (37 C) 98.6 F (37 C)   TempSrc:      SpO2: 95% 97% 98%   Weight:    104.3 kg  Height:        Intake/Output Summary (Last 24 hours) at 08/06/2020 1245 Last data filed at 08/06/2020 0930 Gross per 24 hour  Intake 814.06 ml  Output --  Net 814.06 ml   Last 3 Weights 08/06/2020 08/05/2020 08/03/2020  Weight (lbs) 230 lb 239 lb 203 lb 4.2 oz  Weight (kg) 104.327 kg 108.41 kg 92.2 kg     Body mass index is 32.08  kg/m.  General: Well nourished, somewhat agitated, constantly moving around in bed, sliding downward despite repositioning upwards HEENT: normal Lymph: no adenopathy Neck: no JVD Endocrine:  No thryomegaly Vascular: No carotid bruits; FA pulses 2+ bilaterally without bruits  Cardiac: Irregularly irregular no murmur  Lungs:  clear to auscultation bilaterally, scattered wheeze Abd: soft, nontender, no hepatomegaly  Ext: no edema Musculoskeletal:  No deformities, BUE and BLE strength normal and equal Skin: warm and dry  Neuro:  CNs 2-12 intact, no focal abnormalities noted Psych: Confused, mildly agitated  EKG:  The EKG was personally reviewed and demonstrates:   Atrial fibrillation ventricular rate 94 bpm nonspecific ST abnormality  Telemetry:  Telemetry was personally reviewed and demonstrates: Atrial fibrillation  Relevant CV Studies: Echocardiogram Normal LV function, well-functioning mechanical aortic valve, mildly elevated right heart pressures  Laboratory Data:  High Sensitivity Troponin:   Recent Labs  Lab 07/29/20 0903 07/29/20 1138 08/05/20 1901 08/05/20 2131  TROPONINIHS 82* 80* 32* 36*     Chemistry Recent Labs  Lab 08/04/20 0517 08/05/20 0635 08/06/20 0624  NA 149* 148* 150*  K 3.5 3.8 4.3  CL 112* 113* 114*  CO2 26 23 20*  GLUCOSE 88 105* 102*  BUN 29* 27* 24*  CREATININE 1.07 1.17 1.09  CALCIUM 8.3* 8.1* 8.3*  GFRNONAA >60 56* >60  GFRAA >60 >60 >60  ANIONGAP 11 12 16*    Recent Labs  Lab 08/05/20 1901  AST 33  ALT 21   Hematology Recent Labs  Lab 08/04/20 0517 08/04/20 0517 08/05/20 0635 08/05/20  [id number] 08/05/20 1901 08/05/20 2131 08/06/20 0624  WBC 9.9  --  10.9*  --   --   --  11.0*  RBC 2.90*  --  2.65*  2.67*  --   --   --  3.57*  HGB 8.4*   < > 7.5*   < > 9.2* 9.4* 10.3*  HCT 26.1*   < > 25.4*   < > 29.8* 30.3* 32.2*  MCV 90.0  --  95.8  --   --   --  90.2  MCH 29.0  --  28.3  --   --   --  28.9  MCHC 32.2  --  29.5*  --    --   --  32.0  RDW 18.7*  --  18.6*  --   --   --  18.4*  PLT 284  --  266  --   --   --  139*   < > = values in this interval not displayed.   BNP Recent Labs  Lab 08/04/20 0517 08/05/20 0635  BNP 420.8* 279.3*    DDimer No results for input(s): DDIMER in the last 168 hours.   Radiology/Studies:  DG Chest 1 View  Result Date: 08/04/2020 CLINICAL DATA:  Wheezing. EXAM: CHEST  1 VIEW COMPARISON:  07/31/2020. FINDINGS: Median sternotomy with stable cardiomegaly. Aortic valve replacement. There is increasing peribronchial thickening a possible developing Kerley B-lines. Suspected small pleural effusions. No pneumothorax or confluent airspace disease. Stable sclerotic density projecting over the left scapula likely bone island. IMPRESSION: Cardiomegaly with increasing peribronchial thickening and possible developing Kerley B-lines, suspicious for pulmonary edema. Small pleural effusions. Overall findings suspicious for CHF. Electronically Signed   By: Narda RutherfordMelanie  Sanford M.D.   On: 08/04/2020 21:36   DG Chest Port 1 View  Result Date: 08/06/2020 CLINICAL DATA:  Hypoxia.  Colonoscopy earlier in the day EXAM: PORTABLE CHEST 1 VIEW COMPARISON:  August 04, 2020 FINDINGS: There is cardiomegaly with pulmonary venous hypertension. There is mild interstitial edema. There is no consolidation. Patient is status post aortic valve replacement. There is aortic atherosclerosis. No adenopathy. No pneumoperitoneum evident portable radiographic examination. IMPRESSION: Cardiomegaly with pulmonary vascular congestion. Mild interstitial edema. There may be volume overload/congestive heart failure. No consolidation. Status post aortic valve replacement. Aortic Atherosclerosis (ICD10-I70.0). Electronically Signed   By: Bretta BangWilliam  Woodruff III M.D.   On: 08/06/2020 09:25   ECHOCARDIOGRAM COMPLETE  Result Date: 08/06/2020    ECHOCARDIOGRAM REPORT   Patient Name[facility]   [facility]DNOR [facility] Date of Exam: 08/06/2020 Medical  Rec #:  [id number]            Height:       71.0 in Accession #:    [id number]           Weight:       230.0 lb Date of Birth:  1931/03/29           BSA:          2.238 m Patient Age:    88 years             BP:           155/82 mmHg Patient Gender: M                    HR:           108 bpm. Exam Location:  [facility] Procedure: 2D Echo Indications:     Dyspnea R06.00 s/p AVR  History:  Patient has prior history of Echocardiogram examinations, most                  recent 02/11/2020.  Sonographer:     Wonda Cerise RDCS Referring Phys:  [id number] Tresa Endo A GRIFFITH Diagnosing Phys: Julien Nordmann MD  Sonographer Comments: Technically challenging study due to limited acoustic windows, Technically difficult study due to poor echo windows, suboptimal parasternal window, suboptimal apical window and suboptimal subcostal window. Image acquisition challenging due to patient behavioral factors. and Image acquisition challenging due to uncooperative patient. IMPRESSIONS  1. Left ventricular ejection fraction, by estimation, is 60 to 65%. The left ventricle has normal function. The left ventricle has no regional wall motion abnormalities. Left ventricular diastolic parameters are indeterminate.  2. Right ventricular systolic function is normal. The right ventricular size is mildly enlarged. There is mildly elevated pulmonary artery systolic pressure. The estimated right ventricular systolic pressure is 39.2 mmHg.  3. Mild mitral valve regurgitation.  4. Tricuspid valve regurgitation is moderate.  5. The aortic valve was not well visualized. Mechanical aortic valve. No aortic stenosis is present. Aortic valve mean gradient measures 9.0 mmHg.  6. Challenging images FINDINGS  Left Ventricle: Left ventricular ejection fraction, by estimation, is 60 to 65%. The left ventricle has normal function. The left ventricle has no regional wall motion abnormalities. The left ventricular internal cavity size was normal in size. There is  no  left ventricular hypertrophy. Left ventricular diastolic parameters are indeterminate. Right Ventricle: The right ventricular size is mildly enlarged. No increase in right ventricular wall thickness. Right ventricular systolic function is normal. There is mildly elevated pulmonary artery systolic pressure. The tricuspid regurgitant velocity is 2.70 m/s, and with an assumed right atrial pressure of 10 mmHg, the estimated right ventricular systolic pressure is 39.2 mmHg. Left Atrium: Left atrial size was normal in size. Right Atrium: Right atrial size was normal in size. Pericardium: There is no evidence of pericardial effusion. Mitral Valve: The mitral valve is normal in structure. Mild mitral valve regurgitation. No evidence of mitral valve stenosis. Tricuspid Valve: The tricuspid valve is normal in structure. Tricuspid valve regurgitation is moderate . No evidence of tricuspid stenosis. Aortic Valve: The aortic valve was not well visualized. Aortic valve regurgitation is not visualized. No aortic stenosis is present. Aortic valve mean gradient measures 9.0 mmHg. Aortic valve peak gradient measures 14.3 mmHg. There is a mechanical valve present in the aortic position. Pulmonic Valve: The pulmonic valve was normal in structure. Pulmonic valve regurgitation is not visualized. No evidence of pulmonic stenosis. Aorta: The aortic root is normal in size and structure. Venous: The inferior vena cava is normal in size with greater than 50% respiratory variability, suggesting right atrial pressure of 3 mmHg. IAS/Shunts: No atrial level shunt detected by color flow Doppler.  AORTIC VALVE AV Vmax:      189.00 cm/s AV Vmean:     135.000 cm/s AV VTI:       0.274 m AV Peak Grad: 14.3 mmHg AV Mean Grad: 9.0 mmHg TRICUSPID VALVE TV Peak grad:   29.2 mmHg TV Vmax:        2.70 m/s TR Peak grad:   29.2 mmHg TR Vmax:        270.00 cm/s Julien Nordmann MD Electronically signed by Julien Nordmann MD Signature Date/Time: 08/06/2020/12:34:27  PM    Final (Updated)    {  Assessment and Plan:   1.  Episodes of shortness of breath Echocardiogram with minimally elevated right heart  pressures, Given his prior history of moderate pulmonary hypertension and leg edema, he appears relatively euvolemic.  He has no significant leg edema, low BNP -Suspect agitation/delirium leading to these episodes -Given poor fluid intake, poor p.o. intake in general would be acceptable to continue low rate of half-normal D5 until diet and fluid intake picks up.  Only had several mouthfuls of applesauce today.  P.o. intake limited by delirium  2.  Delirium Presented with confusion but was critically ill at the time with markedly elevated BUN, GI bleed, anemia, poor p.o. intake He has had several rounds of anesthesia for EGD and colonoscopy this admission, sleep-wake disturbance -Neurology following, suspect he may need a mild sedative at night to restore his sleep-wake cycle.  Nurse tech at the bedside reports he has not slept in quite some time  3. S/p AVR mechanical valve On warfarin, therapeutic but requiring periodic transfusion If he continues to be guaiac positive, may need further GI work-up, capsule enteroscopy -Warfarin at 2.0  4.  Atrial flutter/fib Previously declined cardioversion, Elevated CHADS VASC, would continue warfarin at 2.0  5.  GI bleed INR supratherapeutic on arrival Resuscitated with blood transfusion, has had EGD colonoscopy findings unrevealing As above if he continues to bleed may need additional GI work-up Would be very high risk of valve thrombosis and stroke off anticoagulation  Total encounter time more than 110 minutes Greater than 50% was spent in counseling and coordination of care with the patient   For questions or updates, please contact [facility] Please consult [email or web address] for contact info under    Signed, Julien Nordmann, MD  08/06/2020 12:45 PM

## 2020-08-07 ENCOUNTER — Inpatient Hospital Stay: Payer: Medicare Other

## 2020-08-07 DIAGNOSIS — R569 Unspecified convulsions: Secondary | ICD-10-CM

## 2020-08-07 LAB — CBC
HCT: 29.3 % — ABNORMAL LOW (ref 39.0–52.0)
Hemoglobin: 9 g/dL — ABNORMAL LOW (ref 13.0–17.0)
MCH: 28.5 pg (ref 26.0–34.0)
MCHC: 30.7 g/dL (ref 30.0–36.0)
MCV: 92.7 fL (ref 80.0–100.0)
Platelets: 295 10*3/uL (ref 150–400)
RBC: 3.16 MIL/uL — ABNORMAL LOW (ref 4.22–5.81)
RDW: 18.3 % — ABNORMAL HIGH (ref 11.5–15.5)
WBC: 10.1 10*3/uL (ref 4.0–10.5)
nRBC: 0 % (ref 0.0–0.2)

## 2020-08-07 LAB — PROTIME-INR
INR: 2.9 — ABNORMAL HIGH (ref 0.8–1.2)
Prothrombin Time: 29.5 seconds — ABNORMAL HIGH (ref 11.4–15.2)

## 2020-08-07 LAB — BASIC METABOLIC PANEL
Anion gap: 9 (ref 5–15)
BUN: 18 mg/dL (ref 8–23)
CO2: 25 mmol/L (ref 22–32)
Calcium: 8 mg/dL — ABNORMAL LOW (ref 8.9–10.3)
Chloride: 113 mmol/L — ABNORMAL HIGH (ref 98–111)
Creatinine, Ser: 0.99 mg/dL (ref 0.61–1.24)
GFR calc Af Amer: >60 mL/min (ref >60)
GFR calc non Af Amer: >60 mL/min (ref >60)
Glucose, Bld: 112 mg/dL — ABNORMAL HIGH (ref 70–99)
Potassium: 3.7 mmol/L (ref 3.5–5.1)
Sodium: 147 mmol/L — ABNORMAL HIGH (ref 135–145)

## 2020-08-07 LAB — HEMOGLOBIN AND HEMATOCRIT, BLOOD
HCT: 28 % — ABNORMAL LOW (ref 39.0–52.0)
Hemoglobin: 9 g/dL — ABNORMAL LOW (ref 13.0–17.0)

## 2020-08-07 LAB — PROCALCITONIN: Procalcitonin: <0.1 ng/mL

## 2020-08-07 LAB — BRAIN NATRIURETIC PEPTIDE: B Natriuretic Peptide: 265.7 pg/mL — ABNORMAL HIGH (ref 0.0–100.0)

## 2020-08-07 MED ORDER — GUAIFENESIN ER 600 MG PO TB12
600.0000 mg | ORAL_TABLET | Freq: Two times a day (BID) | ORAL | Status: DC
  Administered 2020-08-07 – 2020-08-10 (×6): 600 mg via ORAL
  Filled 2020-08-07 (×7): qty 1

## 2020-08-07 MED ORDER — WARFARIN SODIUM 1 MG PO TABS
1.0000 mg | ORAL_TABLET | Freq: Once | ORAL | Status: AC
  Administered 2020-08-07: 1 mg via ORAL
  Filled 2020-08-07: qty 1

## 2020-08-07 MED ORDER — GUAIFENESIN-DM 100-10 MG/5ML PO SYRP
5.0000 mL | ORAL_SOLUTION | ORAL | Status: DC | PRN
  Administered 2020-08-07: 5 mL via ORAL
  Filled 2020-08-07: qty 5

## 2020-08-07 NOTE — Progress Notes (Signed)
PROGRESS NOTE    [name]   ZOX:[id number]  DOB: 02-01-1931  PCP: Gracelyn Nurse, MD    DOA: 07/29/2020 LOS: 9   Brief Narrative   [name] is a 86 y.o. M with dCHF, HTN, CAD, AS s/p St Jude AVR 2002 on coumadin, tricuspid regurg, chronic anemia, Cdiff remotely, and PUD who presented to the ED on 07/29/20 from his SNF with confusion, shortness of breath and found to be hypoxic by SNF staff.   In the ER, SpO2 < 90%, tachypneic, severely anemic with Hgb 4 g/dL, supratherapeutic INR >81, and rectal exam positive for blood.   Given PRBCs x2 units, PCCs, vitamin K and FFP.  Started on PPI, admitted to hospitalist service with GI consulted.     Assessment & Plan   Principal Problem:   Acute blood loss anemia Active Problems:   Depression   Aortic stenosis   History of mechanical aortic valve replacement   Chronic diastolic CHF (congestive heart failure) (HCC)   H/O atrial flutter   Lactic acidosis   Hemorrhagic shock (HCC)   Pressure injury of skin  Acute metabolic encephalopathy / Delirium - Finally improving.  Had been quite persistent since 9/8 (day 3 of admission).  Mildly improvement seen 9/12, and more so today 9/13.  Today is A&Ox3, continues with intermittent gasping spells. --UA negative for infection --Blood cx pending - rule out infection --CXR's have been negative for PNA, more consistent with pulmonary edema but stable --Ammonia level normal --No CIWA needed, patient NOT a heavy/regular drinker --Consulted neurology for further evaluation --Delirium precautions:     -Lights and TV off, minimize interruptions at night    -Blinds open and lights on during day    -Glasses/hearing aid with patient    -Frequent reorientation    -PT/OT when able    -Avoid sedation medications/Beers list medications   Episodic Wheezing / Dyspnea - patient having intermittent episodes of gasping, Cheyne-stokes-like breathing pattern.  However, he has not had expiratory  wheezes or crackles on lung auscultation.  This seems related to his encephalopathy, not cardio/pulmonary etiology at this point. --ABG 9/11: normal pCO2, good air movement on exam and no expiratory wheezes, not likely bronchospasm --no crackles on exam when pt calm, hypernatremia, elevated BUN, no edema, dry mucosal membranes - clinically appears dry despite cxr appearance.  BNP is lower than several days ago.   --Inaccurate I/O's - Net fluid balance is unknown, outputs are not being captured consistently as patient is incontinent of urine.   --Avoid further lasix for now. --STOP D5-1/2NS - pt has increased O2 requirement this AM, still no edema --negative procal and lactic acid, no leukocytosis on 9/11  --monitor for signs of infection  --stopped gentle D5W for his hypernatremia   Anorexia - due to GI procedures and ongoing confusion/delirium, patient has not eaten in several days.  Family expressing concern, what plan would be if he does not start to eat. Discussed NG tube vs PEG tube and artificial feeds.  Explained these would both increase his confusion and agitation.   Monitor intake.  Assist with feeding.  Encourage frequent PO intake, drink water.    Acute GI bleed - likely diverticular bleeding Acute blood loss anemia superimposed on chronic iron deficiency anemia Acquired thrombophilia with supratherapeutic INR INR > 10 at admission.  Given PCC, phytonadione and FFP at admission.  Admitted and started on PPI. EGD 9/5 by Dr. Norma Fredrickson, no ulcers or other bleeding source was found. Colonoscopy 9/9  by Dr. Norma Fredrickson - fair prep, diverticulosis on sigmoid and descending colon, blood in sigmoid colon, non-bleeding internal hemorrhoids. 9/9 - ongoing melena per nursing 9/10-11 - no reports of melena or hematochezia 9/11 - transfused 1 unit RBC's for Hbg 7.5  9/13 - pt appears pale,  Hbg dropped slightly 10.3 >> 9.0 this AM  --repeat H&H this afternoon  --Is back on warfarin, completed  heparin bridging  --GI following -Trend Hgb, transfuse for Hbg < 8 given CHF and cardiogenic shock at presentation --Venofer given 9/12 --Page GI if bleeding recurs or acutely worsening hemodynamics with drop in H&H   Hypernatremia - Na 148>>149>>148>>150>>147 this AM, due to NPO status for procedure, also poor PO intake likely due to confusion/delirium.  Expect improvement with resumption of diet, but pt having poor oral intake due to his confusion. --We ran gentle D5-1/2NS at 50 cc/hr x 2 days with improvement in Na level --9/13 Stop fluids for now as pt needing more O2 this AM. --BMP in AM --encourage pt to drink water / PO intake --assist with feeding patient   Acute respiratory failure with hypoxia due to below.  Pt with no home oxygen, symptomatic with shortness of breath and requiring supplemental oxygen.   Acute on chronic diastolic CHF - POA. Resolved. Hypertension Coronary artery disease, secondary prevention Valvular heart disease On admission, CXR showed edema, pt was hypoxic 9/5.   Improved with Lasix, which has since been stopped.   Lasix was again given 9/10-11 due to signs of respiratory distress and cxr w/edema. 9/12 Echo - EF 60-65%, mildly elevate PASP, mild MR, mod TR, mechanical aortic valve --Avoid further Lasix for now, patient is dry -Continue metoprolol -Hold clonidine, doxazosin, Imdur due to low BP --Home torsemide and metolazone  --Monitor volume status closely --Cardiology consulted    Permanent A-fib/flutter History of aortic stenosis s/p St Jude aortic valve  Supratherapeutic INR - POA, INR > 10.  Target INR 2.5-3.5.   Patient's INR was reversed on admission. -Off heparin bridge -Continue warfarin per pharmacy --INR goal 2.5-3.5   Abdominal distension, possible very mild ileus - Resolved. S/p bowel prep 9/5 evening, and actually didn't have bowel movements for >12 hours.   Monitor abdominal exam and stool output.   Hypokalemia -  replaced.  Monitor K and Mg, replace as needed.  OSA - have not had on CPAP due to agitation.  Order once he'll tolerate.   Restless leg syndrome - continue ropinirole  Gout - hold allopurinol for now   Depression - hold duloxetine  BPH - hold finasteride   Lower extremity venous stasis wounds - bilateral, POA. WOC following.  Unna boots in place.  Pressure injury of sacrum, stage 2 - present on admission  Pressure Injury 08/02/20 Sacrum Stage 2 -  Partial thickness loss of dermis presenting as a shallow open injury with a red, pink wound bed without slough. (Active)  08/02/20 1600  Location: Sacrum  Location Orientation:   Staging: Stage 2 -  Partial thickness loss of dermis presenting as a shallow open injury with a red, pink wound bed without slough.  Wound Description (Comments):   Present on Admission:      DVT prophylaxis: SCDs Start: 07/29/20 1352   Diet:  Diet Orders (From admission, onward)    Start     Ordered   08/03/20 1400  DIET SOFT Room service appropriate? Yes; Fluid consistency: Thin  Diet effective now       Question Answer Comment  Room service  appropriate? Yes   Fluid consistency: Thin      08/03/20 1359            Code Status: Full Code    Subjective 08/07/20    Patient seen with daughter at bedside today. Patient is awake and much more talkative and interactive.  Is oriented x 3.  Still does drift off to sleep but more easy to wake up now, stays awake much longer.  Still with gasping episodes and wheezing.  NT was getting vitals, O2 was 82%, so O2 was turned up.    He had a coughing spell, got phlegm stuck, turned bright red and briefly was in distress.  RN went to get suction.  I sat patient forward in the bed, leaning forward, encouraging strong cough.  He eventually coughed up some thick appearing yellow sputum and quickly recovered.     Disposition Plan & Communication   Status is: Inpatient  Remain Inpatient Appropriate because  severity of illness requiring IV medications, ongoing encephalopathy.  Inadequate PO intake.  Hypoxic today.    Dispo: The patient is from: SNF              Anticipated d/c is to: SNF              Anticipated d/c date is: 2 days              Patient currently is not medically stable for discharge.   Family Communication: son was at bedside during encounter today, 9/12   Consults, Procedures, Significant Events   Consultants:   Gastroenterology  Neurology  Cardiology  Procedures:   EGD 07/30/20  Echo 9/11   Objective   Vitals:   08/06/20 2000 08/07/20 0342 08/07/20 0408 08/07/20 0754  BP: 105/86 129/64    Pulse: 88 78 83   Resp: 20     Temp: 97.9 F (36.6 C) 97.6 F (36.4 C)    TempSrc:  Oral    SpO2: 97%  97% 96%  Weight:   104.3 kg   Height:        Intake/Output Summary (Last 24 hours) at 08/07/2020 1239 Last data filed at 08/07/2020 1020 Gross per 24 hour  Intake 1230.83 ml  Output --  Net 1230.83 ml   Filed Weights   08/05/20 0445 08/06/20 0448 08/07/20 0408  Weight: 108.4 kg 104.3 kg 104.3 kg    Physical Exam:  General exam: awake, more interactive, no acute distress aside from brief spell with trouble clearing phlegm Respiratory system: scattered wheezes, do not appreciate crackles, exam limited by patient speaking Cardiovascular system:  Irregular but regular rate, normal S1/S2, no pedal edema, 2/6 systolic murmur, mech valve click.   Central nervous system: more alert and less lethargic, no gross focal neurologic deficits, normal speech, following commands today Extremities: moves all, venous stasis of LE's, no edema Psychiatric: agitation slowly improving, no apparent hallucinations.  In mittens.   Labs   Data Reviewed: I have personally reviewed following labs and imaging studies  CBC: Recent Labs  Lab 08/03/20 0449 08/03/20 1454 08/04/20 0517 08/04/20 0517 08/05/20 0635 08/05/20 1901 08/05/20 2131 08/06/20 0624 08/07/20 0701  WBC  9.9  --  9.9  --  10.9*  --   --  11.0* 10.1  HGB 8.2*   < > 8.4*   < > 7.5* 9.2* 9.4* 10.3* 9.0*  HCT 25.7*   < > 26.1*   < > 25.4* 29.8* 30.3* 32.2* 29.3*  MCV 93.1  --  90.0  --  95.8  --   --  90.2 92.7  PLT 275  --  284  --  266  --   --  139* 295   < > = values in this interval not displayed.   Basic Metabolic Panel: Recent Labs  Lab 08/03/20 0449 08/04/20 0517 08/05/20 0635 08/06/20 0624 08/07/20 0701  NA 148* 149* 148* 150* 147*  K 3.5 3.5 3.8 4.3 3.7  CL 111 112* 113* 114* 113*  CO2 26 26 23  20* 25  GLUCOSE 105* 88 105* 102* 112*  BUN 36* 29* 27* 24* 18  CREATININE 1.00 1.07 1.17 1.09 0.99  CALCIUM 8.5* 8.3* 8.1* 8.3* 8.0*  MG 3.0*  --  2.5* 2.6*  --    GFR: Estimated Creatinine Clearance: 65.8 mL/min (by C-G formula based on SCr of 0.99 mg/dL). Liver Function Tests: Recent Labs  Lab 08/05/20 1901  AST 33  ALT 21   No results for input(s): LIPASE, AMYLASE in the last 168 hours. Recent Labs  Lab 08/05/20 1901  AMMONIA 11   Coagulation Profile: Recent Labs  Lab 08/03/20 0449 08/04/20 0517 08/05/20 0635 08/06/20 1239 08/07/20 0701  INR 2.1* 2.5* 3.1* 3.3* 2.9*   Cardiac Enzymes: No results for input(s): CKTOTAL, CKMB, CKMBINDEX, TROPONINI in the last 168 hours. BNP (last 3 results) No results for input(s): PROBNP in the last 8760 hours. HbA1C: No results for input(s): HGBA1C in the last 72 hours. CBG: Recent Labs  Lab 08/02/20 1624 08/02/20 2031 08/03/20 2040 08/04/20 0730 08/04/20 2337  GLUCAP 99 110* 108* 94 138*   Lipid Profile: No results for input(s): CHOL, HDL, LDLCALC, TRIG, CHOLHDL, LDLDIRECT in the last 72 hours. Thyroid Function Tests: No results for input(s): TSH, T4TOTAL, FREET4, T3FREE, THYROIDAB in the last 72 hours. Anemia Panel: Recent Labs    08/05/20 0635 08/05/20 1103  VITAMINB12  --  828  FOLATE 11.0  --   FERRITIN 60  --   TIBC 325  --   IRON 20*  --   RETICCTPCT 7.7*  --    Sepsis Labs: Recent Labs  Lab  08/05/20 1222 08/05/20 1901  PROCALCITON <0.10  --   LATICACIDVEN 1.5 1.4    Recent Results (from the past 240 hour(s))  SARS Coronavirus 2 by RT PCR (hospital order, performed in [facility] hospital lab) Nasopharyngeal Nasopharyngeal Swab     Status: None   Collection Time: 07/29/20  9:54 AM   Specimen: Nasopharyngeal Swab  Result Value Ref Range Status   SARS Coronavirus 2 NEGATIVE NEGATIVE Final    Comment: (NOTE) SARS-CoV-2 target nucleic acids are NOT DETECTED.  The SARS-CoV-2 RNA is generally detectable in upper and lower respiratory specimens during the acute phase of infection. The lowest concentration of SARS-CoV-2 viral copies this assay can detect is 250 copies / mL. A negative result does not preclude SARS-CoV-2 infection and should not be used as the sole basis for treatment or other patient management decisions.  A negative result may occur with improper specimen collection / handling, submission of specimen other than nasopharyngeal swab, presence of viral mutation(s) within the areas targeted by this assay, and inadequate number of viral copies (<250 copies / mL). A negative result must be combined with clinical observations, patient history, and epidemiological information.  Fact Sheet for Patients:   BoilerBrush.com.cy  Fact Sheet for Healthcare Providers: https://pope.com/  This test is not yet approved or  cleared by the [place] FDA and has been authorized for detection  and/or diagnosis of SARS-CoV-2 by FDA under an Emergency Use Authorization (EUA).  This EUA will remain in effect (meaning this test can be used) for the duration of the COVID-19 declaration under Section 564(b)(1) of the Act, 21 U.S.C. section 360bbb-3(b)(1), unless the authorization is terminated or revoked sooner.  Performed at [facility], [address] [place], [place] [address]   Blood culture (routine single)      Status: None   Collection Time: 07/29/20  9:54 AM   Specimen: BLOOD  Result Value Ref Range Status   Specimen Description BLOOD RIGHT HAND  Final   Special Requests   Final    BOTTLES DRAWN AEROBIC AND ANAEROBIC Blood Culture adequate volume   Culture   Final    NO GROWTH 5 DAYS Performed at [facility], [address], [place], [place] [address]    Report Status 08/03/2020 FINAL  Final  Urine culture     Status: None   Collection Time: 07/29/20  9:57 AM   Specimen: Urine, Random  Result Value Ref Range Status   Specimen Description   Final    URINE, RANDOM Performed at [facility], [address], [place], [place] [address]    Special Requests   Final    NONE Performed at [facility], [address] [place], [place] [address]    Culture   Final    NO GROWTH Performed at [facility], [address], [place], [place] [address]    Report Status 07/31/2020 FINAL  Final  MRSA PCR Screening     Status: None   Collection Time: 07/31/20  8:25 PM   Specimen: Nasal Mucosa; Nasopharyngeal  Result Value Ref Range Status   MRSA by PCR NEGATIVE NEGATIVE Final    Comment:        The GeneXpert MRSA Assay (FDA approved for NASAL specimens only), is one component of a comprehensive MRSA colonization surveillance program. It is not intended to diagnose MRSA infection nor to guide or monitor treatment for MRSA infections. Performed at [facility], [address] [place], [place] [address]   Culture, blood (single) w Reflex to ID Panel     Status: None (Preliminary result)   Collection Time: 08/05/20 12:22 PM   Specimen: BLOOD  Result Value Ref Range Status   Specimen Description BLOOD RIGHT ANTECUBITAL  Final   Special Requests   Final    BOTTLES DRAWN AEROBIC AND ANAEROBIC Blood Culture results may not be optimal due to an excessive volume of blood received in culture bottles   Culture   Final    NO GROWTH 2 DAYS Performed at  [facility], [address] [place], [place] [address]    Report Status PENDING  Incomplete      Imaging Studies   DG Chest Port 1 View  Result Date: 08/06/2020 CLINICAL DATA:  Hypoxia.  Colonoscopy earlier in the day EXAM: PORTABLE CHEST 1 VIEW COMPARISON:  August 04, 2020 FINDINGS: There is cardiomegaly with pulmonary venous hypertension. There is mild interstitial edema. There is no consolidation. Patient is status post aortic valve replacement. There is aortic atherosclerosis. No adenopathy. No pneumoperitoneum evident portable radiographic examination. IMPRESSION: Cardiomegaly with pulmonary vascular congestion. Mild interstitial edema. There may be volume overload/congestive heart failure. No consolidation. Status post aortic valve replacement. Aortic Atherosclerosis (ICD10-I70.0). Electronically Signed   By: Bretta Bang III M.D.   On: 08/06/2020 09:25   ECHOCARDIOGRAM COMPLETE  Result Date: 08/06/2020  ECHOCARDIOGRAM REPORT   Patient Name[facility]   [facility] Date of Exam: 08/06/2020 Medical Rec #:  [id number]            Height:       71.0 in Accession #:    [id number]           Weight:       230.0 lb Date of Birth:  05/25/1931           BSA:          2.238 m Patient Age:    88 years             BP:           155/82 mmHg Patient Gender: M                    HR:           108 bpm. Exam Location:  [facility] Procedure: 2D Echo Indications:     Dyspnea R06.00 s/p AVR  History:         Patient has prior history of Echocardiogram examinations, most                  recent 02/11/2020.  Sonographer:     Wonda Ceriseikeshia Stills RDCS Referring Phys:  [id number] Tresa EndoKELLY A Juliannah Ohmann Diagnosing Phys: Julien Nordmannimothy Gollan MD  Sonographer Comments: Technically challenging study due to limited acoustic windows, Technically difficult study due to poor echo windows, suboptimal parasternal window, suboptimal apical window and suboptimal subcostal window. Image acquisition challenging due to patient behavioral  factors. and Image acquisition challenging due to uncooperative patient. IMPRESSIONS  1. Left ventricular ejection fraction, by estimation, is 60 to 65%. The left ventricle has normal function. The left ventricle has no regional wall motion abnormalities. Left ventricular diastolic parameters are indeterminate.  2. Right ventricular systolic function is normal. The right ventricular size is mildly enlarged. There is mildly elevated pulmonary artery systolic pressure. The estimated right ventricular systolic pressure is 39.2 mmHg.  3. Mild mitral valve regurgitation.  4. Tricuspid valve regurgitation is moderate.  5. The aortic valve was not well visualized. Mechanical aortic valve. No aortic stenosis is present. Aortic valve mean gradient measures 9.0 mmHg.  6. Challenging images FINDINGS  Left Ventricle: Left ventricular ejection fraction, by estimation, is 60 to 65%. The left ventricle has normal function. The left ventricle has no regional wall motion abnormalities. The left ventricular internal cavity size was normal in size. There is  no left ventricular hypertrophy. Left ventricular diastolic parameters are indeterminate. Right Ventricle: The right ventricular size is mildly enlarged. No increase in right ventricular wall thickness. Right ventricular systolic function is normal. There is mildly elevated pulmonary artery systolic pressure. The tricuspid regurgitant velocity is 2.70 m/s, and with an assumed right atrial pressure of 10 mmHg, the estimated right ventricular systolic pressure is 39.2 mmHg. Left Atrium: Left atrial size was normal in size. Right Atrium: Right atrial size was normal in size. Pericardium: There is no evidence of pericardial effusion. Mitral Valve: The mitral valve is normal in structure. Mild mitral valve regurgitation. No evidence of mitral valve stenosis. Tricuspid Valve: The tricuspid valve is normal in structure. Tricuspid valve regurgitation is moderate . No evidence of tricuspid  stenosis. Aortic Valve: The aortic valve was not well visualized. Aortic valve regurgitation is not visualized. No aortic stenosis is present. Aortic valve mean gradient measures 9.0 mmHg. Aortic valve peak gradient measures 14.3 mmHg. There is a mechanical valve present  in the aortic position. Pulmonic Valve: The pulmonic valve was normal in structure. Pulmonic valve regurgitation is not visualized. No evidence of pulmonic stenosis. Aorta: The aortic root is normal in size and structure. Venous: The inferior vena cava is normal in size with greater than 50% respiratory variability, suggesting right atrial pressure of 3 mmHg. IAS/Shunts: No atrial level shunt detected by color flow Doppler.  AORTIC VALVE AV Vmax:      189.00 cm/s AV Vmean:     135.000 cm/s AV VTI:       0.274 m AV Peak Grad: 14.3 mmHg AV Mean Grad: 9.0 mmHg TRICUSPID VALVE TV Peak grad:   29.2 mmHg TV Vmax:        2.70 m/s TR Peak grad:   29.2 mmHg TR Vmax:        270.00 cm/s Julien Nordmann MD Electronically signed by Julien Nordmann MD Signature Date/Time: 08/06/2020/12:34:27 PM    Final (Updated)      Medications   Scheduled Meds: . DULoxetine  30 mg Oral Daily  . folic acid  1 mg Oral Daily  . ipratropium-albuterol  3 mL Nebulization Q6H WA  . metoprolol succinate  12.5 mg Oral Daily  .  morphine injection  1 mg Intravenous Once  . multivitamin with minerals  1 tablet Oral Daily  . rOPINIRole  2 mg Oral QID  . thiamine  100 mg Oral Daily  . traZODone  50 mg Oral QHS  . warfarin  1 mg Oral ONCE-1600  . Warfarin - Pharmacist Dosing Inpatient   Does not apply q1600   Continuous Infusions: . dextrose 5 % and 0.45% NaCl 50 mL/hr at 08/07/20 0457       LOS: 9 days    Time spent: 30 minutes with > 50% spent in coordination of care and direct patient contact.    Pennie Banter, DO [facility]  08/07/2020, 12:39 PM    If 7PM-7AM, please contact night-coverage. How to contact the [facility] Attending or Consulting  provider 7A - 7P or covering provider during after hours 7P -7A, for this patient?    1. Check the care team in [facility] and look for a) attending/consulting [facility] provider listed and b) the [facility] team listed 2. Log into [email or web address] and use [place]'s universal password to access. If you do not have the password, please contact the hospital operator. 3. Locate the [facility] provider you are looking for under [facility] and page to a number that you can be directly reached. 4. If you still have difficulty reaching the provider, please page the [facility] (Director on Call) for the Hospitalists listed on amion for assistance. 0

## 2020-08-07 NOTE — Progress Notes (Signed)
eeg done °

## 2020-08-07 NOTE — Procedures (Signed)
Patient Name: [name]  MRN: [id number]  Epilepsy Attending: Charlsie Quest  Referring Physician/Provider: Dr Caryl Pina Date: 08/07/2020 Duration: 28.05 mins  Patient history: 87 year old male with progressively worsening delirium in the setting of GIB. EEG to evaluate for seizure.  Level of alertness: Awake  AEDs during EEG study: None  Technical aspects: This EEG study was done with scalp electrodes positioned according to the 10-20 International system of electrode placement. Electrical activity was acquired at a sampling rate of 500Hz  and reviewed with a high frequency filter of 70Hz  and a low frequency filter of 1Hz . EEG data were recorded continuously and digitally stored.   Description: The posterior dominant rhythm consists of 7 Hz activity of moderate voltage (25-35 uV) seen predominantly in posterior head regions, symmetric and reactive to eye opening and eye closing. EEG showed continuous generalized 3 to 6 Hz theta-delta slowing. Hyperventilation and photic stimulation were not performed.     ABNORMALITY -Continuous slow, generalized  IMPRESSION: This study is suggestive of mild diffuse encephalopathy, nonspecific etiology. No seizures or epileptiform discharges were seen throughout the recording.   [name] 

## 2020-08-07 NOTE — Consult Note (Addendum)
WOC Nurse Consult Note: Reason for Consult: WOC team has been following for bilat leg wounds.  Thesehave iproved during the hospital stay. Generalized edema and darker-clored skin to bilat legs; appearance consistent with venous stasis changes. Left leg with 2 full thickness wounds;  Outer right calf 4X.5X.1cm, dry yellow wound bed, no odor or drainage Inner right calf 3X1X.1cm, dry yellow wound bed, no odor or drainage Right anterior calf with partial thickness wound; .5X.5X.1cm, moist pink wound, small amt pink drainage Dressing procedure/placement/frequency: Discontinue Santyl and applied bilat Una boots and coban to bilat legs.  WOC will change Q Mon while patient is in the hospital; pt will require weekly changes after discharge. Cammie Mcgee MSN, RN, CWOCN, [place], CNS [phone]

## 2020-08-07 NOTE — Progress Notes (Signed)
Mobility Specialist - Progress Note   08/07/20 1321  Mobility  Activity Contraindicated/medical hold  Mobility performed by Mobility specialist    Per discussion with nurse, pt currently OOF for imaging. Will attempt session at a later date/time when pt becomes available.     Filiberto Pinks Mobility Specialist 08/07/20, 1:22 PM

## 2020-08-07 NOTE — Progress Notes (Signed)
ANTICOAGULATION CONSULT NOTE  Pharmacy Consult for Warfarin Indication: aortic stenosis s/p St Jude mechanical AVR (2002)  Patient Measurements: Height: 5\' 11"  (180.3 cm) Weight: 104.3 kg (230 lb) IBW/kg (Calculated) : 75.3  Heparin DW: 94.8 kg  Vital Signs: Temp: 97.6 F (36.4 C) (09/13 0342) Temp Source: Oral (09/13 0342) BP: 129/64 (09/13 0342) Pulse Rate: 83 (09/13 0408)  Labs: Recent Labs    08/04/20 1402 08/05/20 10/05/20 08/05/20 0635 08/05/20 1901 08/05/20 1901 08/05/20 2131 08/06/20 0624 08/06/20 1239  HGB  --  7.5*   < > 9.2*   < > 9.4* 10.3*  --   HCT  --  25.4*   < > 29.8*  --  30.3* 32.2*  --   PLT  --  266  --   --   --   --  139*  --   LABPROT  --  30.8*  --   --   --   --   --  32.6*  INR  --  3.1*  --   --   --   --   --  3.3*  HEPARINUNFRC 0.43 0.35  --   --   --   --   --   --   CREATININE  --  1.17  --   --   --   --  1.09  --   TROPONINIHS  --   --   --  32*  --  36*  --   --    < > = values in this interval not displayed.    Estimated Creatinine Clearance: 59.8 mL/min (by C-G formula based on SCr of 1.09 mg/dL).   Medical History: Past Medical History:  Diagnosis Date  . Anxiety 10/11  . Aortic stenosis    a. s/p mechcanical AVR, 2002; b. 10/2018 Echo: Triv AI, mean grad 11/2018.  . Bradycardia    chronic, no symptoms 07/2010  . C. difficile colitis   . Carotid bruit    dopplers in past, no abnormalities  . Chronic diastolic CHF (congestive heart failure) (HCC)    a. Echo 03/2015: EF 60-65%, no RWMA, GR1DD, mild BAE, mild to mod MR, mod TR, PASP 65 mmHg; b. 01/2017 Echo: EF 55-60%, NRWMA, grade 1 diastolic dysfunction.  Normal functioning prosthetic aortic valve.  Mean gradient 50 mmHg.  Sev TR. PASP 02/2017; c. 10/2018 Echo: EF 55-60%, Triv AI, mod dil LA, mod-sev TR, PASP 35-40, mild to mod red RV fxn.  . Coronary artery disease    a. mild, cath, 08/2010; b. medically managed  . Decreased hearing    Right ear  . Depression   . Gastric ulcer    . GERD (gastroesophageal reflux disease)   . Hypertension    BP higher than usual 04/19/10; amlodipine increased by telephone  . Mod-Sev Tricuspid regurgitation    a. 10/2018 Echo: Mod-Sev TR, PASP 35-55mmHg.  43m RLS (restless legs syndrome) 08/23/2015  . S/P AVR    a. St. Jude. mechanical 2002; b. echo 08/2010 EF 60%, trival AI, mild MR, AVR working well; c. on longterm warfarin tx  . SOB (shortness of breath) 10/11   08/2010,Episodes at 5 AM, eventually felt to be anxiety, after complete workup including catheterization, pt greatly improved with anxiety meds 11/11    Medications:  PTA warfarin 5mg  daily  Assessment: Pharmacy has been consulted for warfarin dosing and monitoring in an 86yo male with a PMH significant for aortic stenosis s/p St. Jude mechanical AVR in 2002 (on  Coumadin), chronic diastolic HF, HTN, CAD, chronic venous stasis. Upon admission patient Hgb 4, Hct 14, and INR >10; rectal exam revealed heme positive stools. Patient received 2 units pRBC, 10mg  IV vitamin K x1, Kcentra, and 1 unit FFP. H&H has improved since admission.    warfarin:  Date   INR   Dose 09/04   1.5   NONE 09/05   1.4   2.5 mg 09/06   1.5   2.5 mg 09/07   2.1   NONE  (2.5 mg vit K po) 09/08   2.4   NONE 09/09   2.1   2.5 mg 09/10   2.5   1 mg   09/11   3.1   NONE 09/12   3.3   1.5 mg 09/13   2.9   1 mg  Goal of Therapy:  INR 2.5-3.5 (per Coumadin clinic) Monitor platelets by anticoagulation protocol: Yes   Plan:   Warfarin 1 mg po x 1 (dose reduced due to sensitive response and supratherapeutic admission INR on home dose of 5mg )   INR in am  10/13, PharmD, BCPS Clinical Pharmacist 08/07/2020 6:52 AM

## 2020-08-07 NOTE — Progress Notes (Signed)
Mobility Specialist - Progress Note   08/07/20 1700  Mobility  Activity  (bed exercises)  Range of Motion/Exercises All extremities  Level of Assistance Contact guard assist, steadying assist  Assistive Device None  Distance Ambulated (ft) 0 ft  Mobility Response Tolerated well  Mobility performed by Mobility specialist  $Mobility charge 1 Mobility    Pt was lying in bed with daughter present in room upon arrival. Pt agreed to session. Pt denied any pain, dizziness, or fatigue. Pt performed bed exercises: straight leg raises, ankle pumps, hip isometrics, hip abd/add (10x/leg), and arm raises (10x/arm) with steadying assist. Pt is impulsive and needed VC to make slow and controlled movements. Overall, pt tolerated session well. Pt was left in bed with all needs in reach.    Filiberto Pinks Mobility Specialist 08/07/20, 5:13 PM

## 2020-08-07 NOTE — Care Management Important Message (Signed)
Important Message  Patient Details  Name: [name] MRN: [id number] Date of Birth: [birth year]   Medicare Important Message Given:  Yes     [name] 08/07/2020, 2:10 PM

## 2020-08-08 DIAGNOSIS — E440 Moderate protein-calorie malnutrition: Secondary | ICD-10-CM | POA: Insufficient documentation

## 2020-08-08 LAB — BASIC METABOLIC PANEL
Anion gap: 10 (ref 5–15)
BUN: 18 mg/dL (ref 8–23)
CO2: 25 mmol/L (ref 22–32)
Calcium: 7.9 mg/dL — ABNORMAL LOW (ref 8.9–10.3)
Chloride: 110 mmol/L (ref 98–111)
Creatinine, Ser: 0.97 mg/dL (ref 0.61–1.24)
GFR calc Af Amer: >60 mL/min (ref >60)
GFR calc non Af Amer: >60 mL/min (ref >60)
Glucose, Bld: 97 mg/dL (ref 70–99)
Potassium: 3.4 mmol/L — ABNORMAL LOW (ref 3.5–5.1)
Sodium: 145 mmol/L (ref 135–145)

## 2020-08-08 LAB — HEMOGLOBIN AND HEMATOCRIT, BLOOD
HCT: 28 % — ABNORMAL LOW (ref 39.0–52.0)
HCT: 29 % — ABNORMAL LOW (ref 39.0–52.0)
Hemoglobin: 8.6 g/dL — ABNORMAL LOW (ref 13.0–17.0)
Hemoglobin: 8.9 g/dL — ABNORMAL LOW (ref 13.0–17.0)

## 2020-08-08 LAB — CBC
HCT: 27.5 % — ABNORMAL LOW (ref 39.0–52.0)
Hemoglobin: 8.4 g/dL — ABNORMAL LOW (ref 13.0–17.0)
MCH: 28.5 pg (ref 26.0–34.0)
MCHC: 30.5 g/dL (ref 30.0–36.0)
MCV: 93.2 fL (ref 80.0–100.0)
Platelets: 274 10*3/uL (ref 150–400)
RBC: 2.95 MIL/uL — ABNORMAL LOW (ref 4.22–5.81)
RDW: 18 % — ABNORMAL HIGH (ref 11.5–15.5)
WBC: 8 10*3/uL (ref 4.0–10.5)
nRBC: 0 % (ref 0.0–0.2)

## 2020-08-08 LAB — HSV(HERPES SIMPLEX VRS) I + II AB-IGG
HSV 1 Glycoprotein G Ab, IgG: 11.1 index — ABNORMAL HIGH (ref 0.00–0.90)
HSV 2 Glycoprotein G Ab, IgG: <0.91 index (ref 0.00–0.90)

## 2020-08-08 LAB — HSV(HERPES SIMPLEX VRS) I + II AB-IGM: HSVI/II Comb IgM: <0.91 Ratio (ref 0.00–0.90)

## 2020-08-08 LAB — PROCALCITONIN: Procalcitonin: <0.1 ng/mL

## 2020-08-08 LAB — PROTIME-INR
INR: 2.6 — ABNORMAL HIGH (ref 0.8–1.2)
Prothrombin Time: 27.3 seconds — ABNORMAL HIGH (ref 11.4–15.2)

## 2020-08-08 MED ORDER — FUROSEMIDE 10 MG/ML IJ SOLN
20.0000 mg | Freq: Once | INTRAMUSCULAR | Status: AC
  Administered 2020-08-08: 20 mg via INTRAVENOUS
  Filled 2020-08-08: qty 4

## 2020-08-08 MED ORDER — POTASSIUM CHLORIDE CRYS ER 20 MEQ PO TBCR
40.0000 meq | EXTENDED_RELEASE_TABLET | ORAL | Status: AC
  Administered 2020-08-08: 40 meq via ORAL
  Filled 2020-08-08: qty 2

## 2020-08-08 MED ORDER — ASCORBIC ACID 500 MG PO TABS
250.0000 mg | ORAL_TABLET | Freq: Two times a day (BID) | ORAL | Status: DC
  Administered 2020-08-08 – 2020-08-10 (×4): 250 mg via ORAL
  Filled 2020-08-08 (×4): qty 1

## 2020-08-08 MED ORDER — FUROSEMIDE 10 MG/ML IJ SOLN
40.0000 mg | Freq: Once | INTRAMUSCULAR | Status: AC
  Administered 2020-08-08: 40 mg via INTRAVENOUS
  Filled 2020-08-08: qty 4

## 2020-08-08 MED ORDER — ACETAMINOPHEN 500 MG PO TABS
1000.0000 mg | ORAL_TABLET | Freq: Four times a day (QID) | ORAL | Status: DC | PRN
  Administered 2020-08-08: 1000 mg via ORAL
  Filled 2020-08-08: qty 2

## 2020-08-08 MED ORDER — WARFARIN SODIUM 2.5 MG PO TABS
2.5000 mg | ORAL_TABLET | Freq: Once | ORAL | Status: AC
  Administered 2020-08-08: 2.5 mg via ORAL
  Filled 2020-08-08: qty 1

## 2020-08-08 MED ORDER — POTASSIUM CHLORIDE CRYS ER 20 MEQ PO TBCR
40.0000 meq | EXTENDED_RELEASE_TABLET | Freq: Once | ORAL | Status: AC
  Administered 2020-08-08: 40 meq via ORAL
  Filled 2020-08-08: qty 2

## 2020-08-08 MED ORDER — ENSURE ENLIVE PO LIQD
237.0000 mL | Freq: Two times a day (BID) | ORAL | Status: DC
  Administered 2020-08-08 – 2020-08-10 (×5): 237 mL via ORAL

## 2020-08-08 MED ORDER — MENTHOL 3 MG MT LOZG
1.0000 | LOZENGE | OROMUCOSAL | Status: DC | PRN
  Filled 2020-08-08: qty 9

## 2020-08-08 NOTE — Progress Notes (Signed)
ANTICOAGULATION CONSULT NOTE  Pharmacy Consult for Warfarin Indication: aortic stenosis s/p St Jude mechanical AVR (2002)  Patient Measurements: Height: 5\' 11"  (180.3 cm) Weight: 104.3 kg (230 lb) IBW/kg (Calculated) : 75.3  Heparin DW: 94.8 kg  Vital Signs: Temp: 97.5 F (36.4 C) (09/14 0524) Temp Source: Oral (09/14 0524) BP: 122/71 (09/14 0524) Pulse Rate: 80 (09/14 0524)  Labs: Recent Labs    08/05/20 1901 08/05/20 1901 08/05/20 2131 08/05/20 2131 08/06/20 0624 08/06/20 0624 08/06/20 1239 08/07/20 0701 08/07/20 1400  HGB 9.2*   < > 9.4*   < > 10.3*   < >  --  9.0* 9.0*  HCT 29.8*   < > 30.3*   < > 32.2*  --   --  29.3* 28.0*  PLT  --   --   --   --  139*  --   --  295  --   LABPROT  --   --   --   --   --   --  32.6* 29.5*  --   INR  --   --   --   --   --   --  3.3* 2.9*  --   CREATININE  --   --   --   --  1.09  --   --  0.99  --   TROPONINIHS 32*  --  36*  --   --   --   --   --   --    < > = values in this interval not displayed.    Estimated Creatinine Clearance: 65.8 mL/min (by C-G formula based on SCr of 0.99 mg/dL).   Medical History: Past Medical History:  Diagnosis Date  . Anxiety 10/11  . Aortic stenosis    a. s/p mechcanical AVR, 2002; b. 10/2018 Echo: Triv AI, mean grad 11/2018.  . Bradycardia    chronic, no symptoms 07/2010  . C. difficile colitis   . Carotid bruit    dopplers in past, no abnormalities  . Chronic diastolic CHF (congestive heart failure) (HCC)    a. Echo 03/2015: EF 60-65%, no RWMA, GR1DD, mild BAE, mild to mod MR, mod TR, PASP 65 mmHg; b. 01/2017 Echo: EF 55-60%, NRWMA, grade 1 diastolic dysfunction.  Normal functioning prosthetic aortic valve.  Mean gradient 50 mmHg.  Sev TR. PASP 02/2017; c. 10/2018 Echo: EF 55-60%, Triv AI, mod dil LA, mod-sev TR, PASP 35-40, mild to mod red RV fxn.  . Coronary artery disease    a. mild, cath, 08/2010; b. medically managed  . Decreased hearing    Right ear  . Depression   . Gastric ulcer    . GERD (gastroesophageal reflux disease)   . Hypertension    BP higher than usual 04/19/10; amlodipine increased by telephone  . Mod-Sev Tricuspid regurgitation    a. 10/2018 Echo: Mod-Sev TR, PASP 35-26mmHg.  43m RLS (restless legs syndrome) 08/23/2015  . S/P AVR    a. St. Jude. mechanical 2002; b. echo 08/2010 EF 60%, trival AI, mild MR, AVR working well; c. on longterm warfarin tx  . SOB (shortness of breath) 10/11   08/2010,Episodes at 5 AM, eventually felt to be anxiety, after complete workup including catheterization, pt greatly improved with anxiety meds 11/11    Medications:  PTA warfarin 5mg  daily  Assessment: Pharmacy has been consulted for warfarin dosing and monitoring in an 86yo male with a PMH significant for aortic stenosis s/p St. Jude mechanical AVR in 2002 (on Coumadin),  chronic diastolic HF, HTN, CAD, chronic venous stasis. Upon admission patient Hgb 4, Hct 14, and INR >10; rectal exam revealed heme positive stools. Patient received 2 units pRBC, 10mg  IV vitamin K x1, Kcentra, and 1 unit FFP. H&H has improved since admission.    warfarin:  Date   INR   Dose 09/04   1.5   NONE 09/05   1.4   2.5 mg 09/06   1.5   2.5 mg 09/07   2.1   NONE  (2.5 mg vit K po) 09/08   2.4   NONE 09/09   2.1   2.5 mg 09/10   2.5   1 mg   09/11   3.1   NONE 09/12   3.3   1.5 mg 09/13   2.9   1 mg 09/14   2.6   2.5 mg  Goal of Therapy:  INR 2.5-3.5 (per Coumadin clinic) Monitor platelets by anticoagulation protocol: Yes   Plan:   Warfarin 2.5 mg po x 1 (dose reduced due to sensitive response and supratherapeutic admission INR on home dose of 5mg )   INR in am  10/14, PharmD, BCPS Clinical Pharmacist 08/08/2020 6:55 AM

## 2020-08-08 NOTE — Progress Notes (Signed)
PROGRESS NOTE    [name]   UKG:[id number]  DOB: 1930-12-05  PCP: Gracelyn Nurse, MD    DOA: 07/29/2020 LOS: 10   Brief Narrative   [name] is a 86 y.o. M with dCHF, HTN, CAD, AS s/p St Jude AVR 2002 on coumadin, tricuspid regurg, chronic anemia, Cdiff remotely, and PUD who presented to the ED on 07/29/20 from his SNF with confusion, shortness of breath and found to be hypoxic by SNF staff.   In the ER, SpO2 < 90%, tachypneic, severely anemic with Hgb 4 g/dL, supratherapeutic INR >76, and rectal exam positive for blood.   Given PRBCs x2 units, PCCs, vitamin K and FFP.  Started on PPI, admitted to hospitalist service with GI consulted.     Assessment & Plan   Principal Problem:   Acute blood loss anemia Active Problems:   Depression   Aortic stenosis   History of mechanical aortic valve replacement   Chronic diastolic CHF (congestive heart failure) (HCC)   H/O atrial flutter   Lactic acidosis   Hemorrhagic shock (HCC)   Pressure injury of skin   Malnutrition of moderate degree  Acute metabolic encephalopathy / Delirium - Finally improving.  Had been persistent since 9/8 (day 3 of admission).  Mildly improvement seen 9/12, and continuing to improve.   Course has been consistent with hospital delirium.   --Infection ruled out --Ammonia level normal --Consulted neurology for further evaluation --Delirium precautions:     -Lights and TV off, minimize interruptions at night    -Blinds open and lights on during day    -Glasses/hearing aid with patient    -Frequent reorientation    -PT/OT when able    -Avoid sedation medications/Beers list medications   Episodic Tachypnea - patient with intermittent episodes Cheyne-stokes-like breathing pattern, will sound wheezy.   However, he has not had expiratory wheezes or crackles on lung auscultation if examined between episodes.   This seems related to his encephalopathy, not cardio/pulmonary etiology at this  point. --ABG 9/11: normal pCO2, good air movement on exam and no expiratory wheezes, not likely bronchospasm 9/12: no crackles on exam when pt calm, hypernatremia, elevated BUN, no edema, dry mucosal membranes - clinically appears dry despite cxr appearance.  BNP not terribly high & lower than several days ago.     Acute respiratory failure with hypoxia due to below.  Pt not on home oxygen, symptomatic with shortness of breath and requiring supplemental oxygen.  Supplemental O2 as needed, maintain sat > 90%, wean as tolerated  Acute on chronic diastolic CHF - POA. Resolved, monitor for recurrence. Hypertension - chronic, stable Coronary artery disease, secondary prevention Valvular heart disease On admission, CXR showed edema, pt was hypoxic 9/5.   Improved with Lasix.   Lasix was again given 9/10-11 due to signs of respiratory distress and cxr w/edema. 9/12 Echo - EF 60-65%, mildly elevate PASP, mild MR, mod TR, mechanical aortic valve  --9/14: gave 20 mg IV Lasix Am, another 40 mg PM due to rising O2 requirements -Continue metoprolol -Hold clonidine, doxazosin, Imdur due to soft BP --Home torsemide and metolazone, giving intermittent IV Lasix as needed --Monitor volume status closely --Cardiology consulted     Anorexia - due to GI procedures and ongoing confusion/delirium, patient has not eaten in several days.  Dietician consulted. Monitor intake.  Assist with feeding.  Encourage frequent PO intake, drink water.    Acute GI bleed - likely diverticular bleeding, resolved. Acute blood loss anemia superimposed on  chronic iron deficiency anemia Acquired thrombophilia with supratherapeutic INR INR > 10 at admission.  Given PCC, phytonadione and FFP at admission.  Admitted and started on PPI. EGD 9/5 by Dr. Norma Fredricksonoledo, no ulcers or other bleeding source was found. Colonoscopy 9/9 by Dr. Norma Fredricksonoledo - fair prep, diverticulosis on sigmoid and descending colon, blood in sigmoid colon, non-bleeding  internal hemorrhoids. 9/9 - ongoing melena per nursing 9/10-11 - no reports of melena or hematochezia 9/11 - transfused 1 unit RBC's for Hbg 7.5  9/12 - Venofer infusion 9/13 - pt appears pale,  Hbg dropped slightly 10.3 >> 9.0 this AM, repeat 9.0  --repeat H&H this afternoon  --Is back on warfarin, completed heparin bridging  --GI consulted -Trend Hgb, transfuse for Hbg < 8 given CHF and cardiogenic shock at presentation --Page GI if bleeding recurs or acutely worsening hemodynamics with drop in H&H   Hypernatremia - Na 148>>149>>148>>150>>147>>145 this AM, due to NPO status for procedure, then very poor PO intake with delirium.  Slowly improving as his mentation clears.   --got gentle D5-1/2NS at 50 cc/hr x 2 days with improvement in Na level --9/13 Stop fluids due to worsening hypoxia. --BMP in AM --encourage pt to drink water / PO intake --assist with feeding patient   Permanent A-fib/flutter History of aortic stenosis s/p St Jude aortic valve  Supratherapeutic INR - POA, INR > 10.  Target INR 2.5-3.5.   Patient's INR was reversed on admission. -Off heparin bridge -Continue warfarin per pharmacy --INR goal 2.5-3.5   Abdominal distension, possible very mild ileus - Resolved. S/p bowel prep 9/5 evening, and actually didn't have bowel movements for >12 hours.   Monitor abdominal exam and stool output.   Hypokalemia - replaced.  Monitor K and Mg, replace as needed.  OSA - have not had on CPAP due to agitation.  Order once he'll tolerate.   Restless leg syndrome - continue ropinirole  Gout - hold allopurinol for now   Depression - hold duloxetine  BPH - hold finasteride   Lower extremity venous stasis wounds - bilateral, POA. WOC following.  Unna boots in place.  Pressure injury of sacrum, stage 2 - present on admission  Pressure Injury 08/02/20 Sacrum Stage 2 -  Partial thickness loss of dermis presenting as a shallow open injury with a red, pink wound  bed without slough. (Active)  08/02/20 1600  Location: Sacrum  Location Orientation:   Staging: Stage 2 -  Partial thickness loss of dermis presenting as a shallow open injury with a red, pink wound bed without slough.  Wound Description (Comments):   Present on Admission:      DVT prophylaxis: SCDs Start: 07/29/20 1352   Diet:  Diet Orders (From admission, onward)    Start     Ordered   08/03/20 1400  DIET SOFT Room service appropriate? Yes; Fluid consistency: Thin  Diet effective now       Question Answer Comment  Room service appropriate? Yes   Fluid consistency: Thin      08/03/20 1359            Code Status: Full Code    Subjective 08/08/20    Patient seen with this AM without family, and again with afternoon with daughter at bedside today.  This morning, pt sitting up in bed, awake alert and interactive.  Seems much closer to baseline.  Asks when he will be released. Thirsty and asking for water.  This afternoon, daughter updated at bedside.  Hopeful patient  can be d/c'd to rehab soon.    Disposition Plan & Communication   Status is: Inpatient  Remain Inpatient Appropriate because severity of illness requiring IV medications, ongoing encephalopathy.  Inadequate PO intake.  Hypoxic today.    Dispo: The patient is from: SNF              Anticipated d/c is to: SNF              Anticipated d/c date is: 2 days              Patient currently is not medically stable for discharge.   Family Communication: son was at bedside during encounter today, 9/12   Consults, Procedures, Significant Events   Consultants:   Gastroenterology  Neurology  Cardiology  Procedures:   EGD 07/30/20  Echo 9/11   Objective   Vitals:   08/07/20 2012 08/07/20 2012 08/08/20 0524 08/08/20 1124  BP:  110/63 122/71 (!) 116/56  Pulse:  75 80 84  Resp:  20 20 16   Temp:  97.6 F (36.4 C) (!) 97.5 F (36.4 C) (!) 97.5 F (36.4 C)  TempSrc:  Oral Oral Oral  SpO2: 93% 93% 94%  92%  Weight:      Height:        Intake/Output Summary (Last 24 hours) at 08/08/2020 1542 Last data filed at 08/08/2020 1358 Gross per 24 hour  Intake 480 ml  Output --  Net 480 ml   Filed Weights   08/05/20 0445 08/06/20 0448 08/07/20 0408  Weight: 108.4 kg 104.3 kg 104.3 kg    Physical Exam:  General exam: awake, talkative, confusion better, no acute distress Respiratory system: CTAB, do not appreciate crackles Cardiovascular system:  Irregular but regular rate, normal S1/S2, no pedal edema, 2/6 systolic murmur, mech valve click.   Central nervous system: more alertno gross focal neurologic deficits, normal speech, follows all commands today Extremities: moves all, venous stasis of LE's, no edema, mitten on left hand only Psychiatric: normal mood, congruent affect, no apparent hallucinations.     Labs   Data Reviewed: I have personally reviewed following labs and imaging studies  CBC: Recent Labs  Lab 08/04/20 0517 08/04/20 0517 08/05/20 0635 08/05/20 1901 08/06/20 0624 08/07/20 0701 08/07/20 1400 08/08/20 0637 08/08/20 1406  WBC 9.9  --  10.9*  --  11.0* 10.1  --  8.0  --   HGB 8.4*   < > 7.5*   < > 10.3* 9.0* 9.0* 8.4* 8.6*  HCT 26.1*   < > 25.4*   < > 32.2* 29.3* 28.0* 27.5* 28.0*  MCV 90.0  --  95.8  --  90.2 92.7  --  93.2  --   PLT 284  --  266  --  139* 295  --  274  --    < > = values in this interval not displayed.   Basic Metabolic Panel: Recent Labs  Lab 08/03/20 0449 08/03/20 0449 08/04/20 0517 08/05/20 0635 08/06/20 0624 08/07/20 0701 08/08/20 0637  NA 148*   < > 149* 148* 150* 147* 145  K 3.5   < > 3.5 3.8 4.3 3.7 3.4*  CL 111   < > 112* 113* 114* 113* 110  CO2 26   < > 26 23 20* 25 25  GLUCOSE 105*   < > 88 105* 102* 112* 97  BUN 36*   < > 29* 27* 24* 18 18  CREATININE 1.00   < > 1.07 1.17 1.09 0.99  0.97  CALCIUM 8.5*   < > 8.3* 8.1* 8.3* 8.0* 7.9*  MG 3.0*  --   --  2.5* 2.6*  --   --    < > = values in this interval not displayed.    GFR: Estimated Creatinine Clearance: 67.2 mL/min (by C-G formula based on SCr of 0.97 mg/dL). Liver Function Tests: Recent Labs  Lab 08/05/20 1901  AST 33  ALT 21   No results for input(s): LIPASE, AMYLASE in the last 168 hours. Recent Labs  Lab 08/05/20 1901  AMMONIA 11   Coagulation Profile: Recent Labs  Lab 08/04/20 0517 08/05/20 0635 08/06/20 1239 08/07/20 0701 08/08/20 0637  INR 2.5* 3.1* 3.3* 2.9* 2.6*   Cardiac Enzymes: No results for input(s): CKTOTAL, CKMB, CKMBINDEX, TROPONINI in the last 168 hours. BNP (last 3 results) No results for input(s): PROBNP in the last 8760 hours. HbA1C: No results for input(s): HGBA1C in the last 72 hours. CBG: Recent Labs  Lab 08/02/20 1624 08/02/20 2031 08/03/20 2040 08/04/20 0730 08/04/20 2337  GLUCAP 99 110* 108* 94 138*   Lipid Profile: No results for input(s): CHOL, HDL, LDLCALC, TRIG, CHOLHDL, LDLDIRECT in the last 72 hours. Thyroid Function Tests: No results for input(s): TSH, T4TOTAL, FREET4, T3FREE, THYROIDAB in the last 72 hours. Anemia Panel: No results for input(s): VITAMINB12, FOLATE, FERRITIN, TIBC, IRON, RETICCTPCT in the last 72 hours. Sepsis Labs: Recent Labs  Lab 08/05/20 1222 08/05/20 1901 08/07/20 1400 08/08/20 0637  PROCALCITON <0.10  --  <0.10 <0.10  LATICACIDVEN 1.5 1.4  --   --     Recent Results (from the past 240 hour(s))  MRSA PCR Screening     Status: None   Collection Time: 07/31/20  8:25 PM   Specimen: Nasal Mucosa; Nasopharyngeal  Result Value Ref Range Status   MRSA by PCR NEGATIVE NEGATIVE Final    Comment:        The GeneXpert MRSA Assay (FDA approved for NASAL specimens only), is one component of a comprehensive MRSA colonization surveillance program. It is not intended to diagnose MRSA infection nor to guide or monitor treatment for MRSA infections. Performed at [facility], [address] [place], [place] [address]   Culture, blood (single) w Reflex  to ID Panel     Status: None (Preliminary result)   Collection Time: 08/05/20 12:22 PM   Specimen: BLOOD  Result Value Ref Range Status   Specimen Description BLOOD RIGHT ANTECUBITAL  Final   Special Requests   Final    BOTTLES DRAWN AEROBIC AND ANAEROBIC Blood Culture results may not be optimal due to an excessive volume of blood received in culture bottles   Culture   Final    NO GROWTH 3 DAYS Performed at [facility], [address], [place], [place] [address]    Report Status PENDING  Incomplete      Imaging Studies   EEG  Result Date: 08/07/2020 Charlsie Quest, MD     08/07/2020  2:07 PM Patient Name: [name] MRN: [id number] Epilepsy Attending: Charlsie Quest Referring Physician/Provider: Dr Caryl Pina Date: 08/07/2020 Duration: 28.05 mins Patient history: 86 year old male with progressively worsening delirium in the setting of GIB. EEG to evaluate for seizure. Level of alertness: Awake AEDs during EEG study: None Technical aspects: This EEG study was done with scalp electrodes positioned according to the 10-20 International system of electrode placement. Electrical activity was acquired at a sampling rate of 500Hz  and reviewed with a  high frequency filter of  and a low frequency filter of . EEG data were recorded continuously and digitally stored. Description: The posterior dominant rhythm consists of 7 Hz activity of moderate voltage (25-35 uV) seen predominantly in posterior head regions, symmetric and reactive to eye opening and eye closing. EEG showed continuous generalized 3 to 6 Hz theta-delta slowing. Hyperventilation and photic stimulation were not performed.   ABNORMALITY -Continuous slow, generalized IMPRESSION: This study is suggestive of mild diffuse encephalopathy, nonspecific etiology. No seizures or epileptiform discharges were seen throughout the recording. Charlsie Quest   DG Chest Port 1 View  Result Date: 08/07/2020 CLINICAL DATA:   Hypoxia. EXAM: PORTABLE CHEST 1 VIEW COMPARISON:  August 06, 2020. FINDINGS: Stable cardiomediastinal silhouette. Status post cardiac valve repair. No pneumothorax is noted. Increased bilateral perihilar and basilar opacities are noted concerning for worsening pulmonary edema with probable small pleural effusions. Bony thorax is unremarkable. IMPRESSION: Increased bilateral perihilar and basilar opacities are noted concerning for worsening pulmonary edema with probable small pleural effusions. Electronically Signed   By: Lupita Raider M.D.   On: 08/07/2020 14:40     Medications   Scheduled Meds: . vitamin C  250 mg Oral BID  . DULoxetine  30 mg Oral Daily  . feeding supplement (ENSURE ENLIVE)  237 mL Oral BID BM  . folic acid  1 mg Oral Daily  . guaiFENesin  600 mg Oral BID  . ipratropium-albuterol  3 mL Nebulization Q6H WA  . metoprolol succinate  12.5 mg Oral Daily  .  morphine injection  1 mg Intravenous Once  . multivitamin with minerals  1 tablet Oral Daily  . potassium chloride  40 mEq Oral Q4H  . rOPINIRole  2 mg Oral QID  . thiamine  100 mg Oral Daily  . traZODone  50 mg Oral QHS  . warfarin  2.5 mg Oral ONCE-1600  . Warfarin - Pharmacist Dosing Inpatient   Does not apply q1600   Continuous Infusions: . dextrose 5 % and 0.45% NaCl 50 mL/hr at 08/07/20 0457       LOS: 10 days    Time spent: 30 minutes with > 50% spent in coordination of care and direct patient contact.    Pennie Banter, DO [facility]  08/08/2020, 3:42 PM    If 7PM-7AM, please contact night-coverage. How to contact the [facility] Attending or Consulting provider 7A - 7P or covering provider during after hours 7P -7A, for this patient?    1. Check the care team in [facility] and look for a) attending/consulting [facility] provider listed and b) the [facility] team listed 2. Log into [email or web address] and use [place]'s universal password to access. If you do not have the password, please contact the hospital  operator. 3. Locate the [facility] provider you are looking for under [facility] and page to a number that you can be directly reached. 4. If you still have difficulty reaching the provider, please page the [facility] (Director on Call) for the Hospitalists listed on amion for assistance. 0

## 2020-08-08 NOTE — Progress Notes (Signed)
Mobility Specialist - Progress Note   08/08/20 1700  Mobility  Activity  (bed exercises)  Range of Motion/Exercises All extremities  Level of Assistance Standby assist, set-up cues, supervision of patient - no hands on  Assistive Device None  Distance Ambulated (ft) 0 ft  Mobility Response Tolerated well  Mobility performed by Mobility specialist  $Mobility charge 1 Mobility    Pre-mobility: 77 HR, 101/58 BP, 97% SpO2 Post-mobility: 81 HR, 117/68 BP, 96% SpO2   Pt was lying in bed with daughter present in room upon arrival. Pt agreed to session. Pt appeared more alert this date than previous sessions. Pt was able to perform straight leg raises, quad sets, ankle pumps, hip add/abd (10x/leg) hip isometrics (15x), and arm raises (10x/arm). Pt denied SOB and pain. Overall, pt tolerated session well. Pt was left in bed with phone/call bell in reach.    Filiberto Pinks Mobility Specialist 08/08/20, 5:11 PM

## 2020-08-08 NOTE — Progress Notes (Addendum)
Initial Nutrition Assessment  DOCUMENTATION CODES:   Non-severe (moderate) malnutrition in context of social or environmental circumstances  INTERVENTION:   Ensure Enlive po BID, each supplement provides 350 kcal and 20 grams of protein  Magic cup TID with meals, each supplement provides 290 kcal and 9 grams of protein  MVI daily  Vitamin C $RemoveB'250mg'TfCCNmiu$  po BID  NUTRITION DIAGNOSIS:   Moderate Malnutrition related to social / environmental circumstances (poor appetite, advanced age) as evidenced by moderate to severe muscle depletions, moderate to severe fat depletions.  GOAL:   Patient will meet greater than or equal to 90% of their needs  MONITOR:   Supplement acceptance, PO intake, Labs, Weight trends, Skin, I & O's  REASON FOR ASSESSMENT:   Consult Assessment of nutrition requirement/status  ASSESSMENT:   86 y.o. male with h/o dCHF, HTN, CAD, AS s/p St Jude AVR 2002 on coumadin, tricuspid regurg, chronic anemia, C diff remotely and PUD who presented to the ED on 07/29/20 from his SNF with confusion, shortness of breath and found to be hypoxic by SNF staff.   Pt s/p EGD 9/5; found to have large hiatal hernia Pt s/p colonoscopy 9/9; found to have hemorrhoids and diverticulosis   Met with pt and pt's daughter in room today. Pt is unable to provide any nutrition related history r/t AMS so history obtained from pt's daughter at bedside. Daughter reports that pt with fair appetite and oral intake at baseline. Daughter reports that pt has only been eating bites of food in the hospital. Pt initially with ileus and abdominal distension but this has resolved. Pt ate a few bites of [place] and some ice cream for lunch today; daughter reports that patient was holding food in his mouth so she just asked him to spit the food out. Pt does enjoy chocolate Ensure per daughter's report. Daughter also reports that pt loves ice cream. RD will add supplements and MVI to help pt meet his estimated needs.  RD will also add vitamin C to support wound healing. Pt is more alert today than previous; will see how pt does with supplements before considering feeding tube.   Daughter reports that pt's UBW is around 204-212lbs. Pt currently up ~17lbs from his UBW r/t edema.   Medications reviewed and include: folic acid, MVI, KCl, thiamine, warfarin, NaCl w/ 5% dextrose $RemoveBef'@50ml'wbVoYBMakU$ /hr  Labs reviewed: K 3.4(L) Iron 20(L), TIBC 325, ferritin 60, folate 11.0, B12 828- 9/11 Hgb 8.6(L), Hct 28.0(L)  NUTRITION - FOCUSED PHYSICAL EXAM:    Most Recent Value  Orbital Region Moderate depletion  Upper Arm Region Severe depletion  Thoracic and Lumbar Region Mild depletion  Buccal Region Mild depletion  Temple Region Moderate depletion  Clavicle Bone Region Severe depletion  Clavicle and Acromion Bone Region Severe depletion  Scapular Bone Region Mild depletion  Dorsal Hand Moderate depletion  Patellar Region Moderate depletion  Anterior Thigh Region Moderate depletion  Posterior Calf Region Severe depletion  Edema (RD Assessment) Mild  Hair Reviewed  Eyes Reviewed  Mouth Reviewed  Skin Reviewed  Nails Reviewed     Diet Order:   Diet Order            DIET SOFT Room service appropriate? Yes; Fluid consistency: Thin  Diet effective now                EDUCATION NEEDS:   No education needs have been identified at this time  Skin:  Skin Assessment: Reviewed RN Assessment (ecchymosis, Stage II sacrum,  Left  medial lower leg:  2.5 cm x 1 cm slough to bed, Right anterior lower leg: 1 cm x 0.5 cm slough to wound bed.)  Last BM:  9/12- type 7  Height:   Ht Readings from Last 1 Encounters:  08/03/20 5' 11"  (1.803 m)    Weight:   Wt Readings from Last 1 Encounters:  08/07/20 104.3 kg    Ideal Body Weight:  78 kg  BMI:  Body mass index is 32.08 kg/m.  Estimated Nutritional Needs:   Kcal:  2000-2300kcal/day  Protein:  100-115g/day  Fluid:  2L/day  Koleen Distance MS, RD, LDN Please  refer to [facility] for RD and/or RD on-call/weekend/after hours pager

## 2020-08-08 NOTE — Progress Notes (Signed)
Physical Therapy Treatment Patient Details Name: Drue Harr MRN: [id number] DOB: [birth year] Today's Date: 08/08/2020    History of Present Illness Cas Cedar Ditullio is a 86 y.o. male with medical history significant for aortic stenosis s/p mechanical TAVR on Coumadin, chronic diastolic heart failure, hypertension, CAD, chronic venous stasis, GERD and history of C. difficile who was sent to the ER from the skilled nursing facility for evaluation of respiratory distress and altered mental status.  Patient was said to be lethargic and had room air pulse oximetry in the low 80s.  When EMS arrived patient was placed on nonrebreather mask with improvement in his pulse oximetry to 90%.  His blood pressure was 91/38 in the field by EMS and he was able to answer questions and follow simple commands.    PT Comments    Patient received sleeping in bed, fidgety. Mitts donned. Once roused, reports he is feeling better, A&O to place, month, year. HOH but follows direction and gestures to sit up on side of bed. He is able to do so with increased time and use of bed rail. Once sitting he is leaning to his left side due to reported R hip pain. Unable to get fully balanced in sitting due to this. And then he began to lie back down and went right back to sleep. He will continue to benefit from skilled PT while here to improve strength and functional independence.      Follow Up Recommendations  SNF     Equipment Recommendations  None recommended by PT    Recommendations for Other Services       Precautions / Restrictions Precautions Precaution Comments: high fall risk Restrictions Weight Bearing Restrictions: No    Mobility  Bed Mobility Overal bed mobility: Needs Assistance Bed Mobility: Supine to Sit;Sit to Supine     Supine to sit: Modified independent (Device/Increase time) Sit to supine: Min assist   General bed mobility comments: no physical assist needed to get from supine to  sitting. Increased time and use of bed rail. Required assistance to bring LEs back up onto bed and for positioning once in supine  Transfers                 General transfer comment: unable to attempt this date due to reported right hip pain with sitting. unable to get fully sitting upright on side of bed. And then started to lie back down. Went right back to sleep.  Ambulation/Gait                 Stairs             Wheelchair Mobility    Modified Rankin (Stroke Patients Only)       Balance Overall balance assessment: Needs assistance Sitting-balance support: Feet supported;Single extremity supported Sitting balance-Leahy Scale: Poor Sitting balance - Comments: pt requiring at least single UE support for static sitting balance                                    Cognition Arousal/Alertness: Lethargic Behavior During Therapy: WFL for tasks assessed/performed Overall Cognitive Status: Within Functional Limits for tasks assessed                                 General Comments: Lethargic, but once roused he is able to tell me where  he is, month, year. Easily falls back to sleep.      Exercises      General Comments        Pertinent Vitals/Pain Pain Assessment: Faces Faces Pain Scale: Hurts even more Pain Location: R hip pain when trying to sit up in bed. Pain Descriptors / Indicators: Aching;Sore Pain Intervention(s): Repositioned    Home Living                      Prior Function            PT Goals (current goals can now be found in the care plan section) Acute Rehab PT Goals PT Goal Formulation: Patient unable to participate in goal setting Time For Goal Achievement: 08/15/20 Potential to Achieve Goals: Good Additional Goals Additional Goal #1: Pt will perform sit <> stand/stand pivot transfer with mod I and RW. Progress towards PT goals: Not progressing toward goals - comment (limited by lethargy  this visit.)    Frequency    Min 2X/week      PT Plan Current plan remains appropriate    Co-evaluation              AM-PAC PT "6 Clicks" Mobility   Outcome Measure  Help needed turning from your back to your side while in a flat bed without using bedrails?: A Little Help needed moving from lying on your back to sitting on the side of a flat bed without using bedrails?: A Little Help needed moving to and from a bed to a chair (including a wheelchair)?: A Lot Help needed standing up from a chair using your arms (e.g., wheelchair or bedside chair)?: A Lot Help needed to walk in hospital room?: A Lot Help needed climbing 3-5 steps with a railing? : Total 6 Click Score: 13    End of Session   Activity Tolerance: Patient limited by lethargy Patient left: in bed;with bed alarm set;with restraints reapplied;Other (comment) (mitts donned per RN request)   PT Visit Diagnosis: Unsteadiness on feet (R26.81);Muscle weakness (generalized) (M62.81);Other abnormalities of gait and mobility (R26.89);Difficulty in walking, not elsewhere classified (R26.2);Pain Pain - Right/Left: Right Pain - part of body: Hip     Time: 1015-1025 PT Time Calculation (min) (ACUTE ONLY): 10 min  Charges:  $Therapeutic Activity: 8-22 mins                     Io Dieujuste, PT, GCS 08/08/20,10:41 AM

## 2020-08-09 ENCOUNTER — Encounter: Payer: Self-pay | Admitting: Family Medicine

## 2020-08-09 DIAGNOSIS — Z515 Encounter for palliative care: Secondary | ICD-10-CM

## 2020-08-09 DIAGNOSIS — R578 Other shock: Secondary | ICD-10-CM

## 2020-08-09 DIAGNOSIS — I4891 Unspecified atrial fibrillation: Secondary | ICD-10-CM

## 2020-08-09 DIAGNOSIS — E440 Moderate protein-calorie malnutrition: Secondary | ICD-10-CM

## 2020-08-09 DIAGNOSIS — R140 Abdominal distension (gaseous): Secondary | ICD-10-CM

## 2020-08-09 DIAGNOSIS — K922 Gastrointestinal hemorrhage, unspecified: Secondary | ICD-10-CM

## 2020-08-09 DIAGNOSIS — Z7189 Other specified counseling: Secondary | ICD-10-CM

## 2020-08-09 LAB — CBC
HCT: 27.5 % — ABNORMAL LOW (ref 39.0–52.0)
Hemoglobin: 8.4 g/dL — ABNORMAL LOW (ref 13.0–17.0)
MCH: 28.7 pg (ref 26.0–34.0)
MCHC: 30.5 g/dL (ref 30.0–36.0)
MCV: 93.9 fL (ref 80.0–100.0)
Platelets: 278 10*3/uL (ref 150–400)
RBC: 2.93 MIL/uL — ABNORMAL LOW (ref 4.22–5.81)
RDW: 18.1 % — ABNORMAL HIGH (ref 11.5–15.5)
WBC: 6.9 10*3/uL (ref 4.0–10.5)
nRBC: 0 % (ref 0.0–0.2)

## 2020-08-09 LAB — BASIC METABOLIC PANEL
Anion gap: 10 (ref 5–15)
BUN: 17 mg/dL (ref 8–23)
CO2: 25 mmol/L (ref 22–32)
Calcium: 8.1 mg/dL — ABNORMAL LOW (ref 8.9–10.3)
Chloride: 109 mmol/L (ref 98–111)
Creatinine, Ser: 1.03 mg/dL (ref 0.61–1.24)
GFR calc Af Amer: >60 mL/min (ref >60)
GFR calc non Af Amer: >60 mL/min (ref >60)
Glucose, Bld: 98 mg/dL (ref 70–99)
Potassium: 4.1 mmol/L (ref 3.5–5.1)
Sodium: 144 mmol/L (ref 135–145)

## 2020-08-09 LAB — PROTIME-INR
INR: 2.6 — ABNORMAL HIGH (ref 0.8–1.2)
Prothrombin Time: 27.1 seconds — ABNORMAL HIGH (ref 11.4–15.2)

## 2020-08-09 LAB — SARS CORONAVIRUS 2 BY RT PCR (HOSPITAL ORDER, PERFORMED IN ~~LOC~~ HOSPITAL LAB): SARS Coronavirus 2: NEGATIVE

## 2020-08-09 LAB — MAGNESIUM: Magnesium: 2.6 mg/dL — ABNORMAL HIGH (ref 1.7–2.4)

## 2020-08-09 LAB — PROCALCITONIN: Procalcitonin: <0.1 ng/mL

## 2020-08-09 MED ORDER — BISACODYL 10 MG RE SUPP
10.0000 mg | Freq: Every day | RECTAL | Status: DC | PRN
  Administered 2020-08-10: 10 mg via RECTAL
  Filled 2020-08-09 (×2): qty 1

## 2020-08-09 MED ORDER — TORSEMIDE 20 MG PO TABS
40.0000 mg | ORAL_TABLET | Freq: Every day | ORAL | Status: DC
  Administered 2020-08-09 – 2020-08-10 (×2): 40 mg via ORAL
  Filled 2020-08-09 (×2): qty 2

## 2020-08-09 MED ORDER — POLYETHYLENE GLYCOL 3350 17 G PO PACK
17.0000 g | PACK | Freq: Every day | ORAL | Status: DC
  Administered 2020-08-10: 17 g via ORAL
  Filled 2020-08-09: qty 1

## 2020-08-09 MED ORDER — WARFARIN SODIUM 2.5 MG PO TABS
2.5000 mg | ORAL_TABLET | Freq: Once | ORAL | Status: AC
  Administered 2020-08-09: 2.5 mg via ORAL
  Filled 2020-08-09: qty 1

## 2020-08-09 MED ORDER — BUSPIRONE HCL 5 MG PO TABS
5.0000 mg | ORAL_TABLET | Freq: Two times a day (BID) | ORAL | Status: DC
  Administered 2020-08-09 – 2020-08-10 (×3): 5 mg via ORAL
  Filled 2020-08-09 (×4): qty 1

## 2020-08-09 MED ORDER — DOCUSATE SODIUM 100 MG PO CAPS
100.0000 mg | ORAL_CAPSULE | Freq: Two times a day (BID) | ORAL | Status: DC
  Administered 2020-08-09 – 2020-08-10 (×3): 100 mg via ORAL
  Filled 2020-08-09 (×2): qty 1

## 2020-08-09 NOTE — Progress Notes (Signed)
ANTICOAGULATION CONSULT NOTE  Pharmacy Consult for Warfarin Indication: aortic stenosis s/p St Jude mechanical AVR (2002)  Patient Measurements: Height: 5\' 11"  (180.3 cm) Weight: 104.3 kg (230 lb) IBW/kg (Calculated) : 75.3  Heparin DW: 94.8 kg  Vital Signs: Temp: 97.5 F (36.4 C) (09/15 0443) Temp Source: Oral (09/15 0443) BP: 115/69 (09/15 0443) Pulse Rate: 73 (09/15 0443)  Labs: Recent Labs    08/07/20 0701 08/07/20 1400 08/08/20 0637 08/08/20 0637 08/08/20 1406 08/08/20 1406 08/08/20 2142 08/09/20 0512  HGB 9.0*   < > 8.4*   < > 8.6*   < > 8.9* 8.4*  HCT 29.3*   < > 27.5*   < > 28.0*  --  29.0* 27.5*  PLT 295  --  274  --   --   --   --  278  LABPROT 29.5*  --  27.3*  --   --   --   --  27.1*  INR 2.9*  --  2.6*  --   --   --   --  2.6*  CREATININE 0.99  --  0.97  --   --   --   --  1.03   < > = values in this interval not displayed.    Estimated Creatinine Clearance: 63.3 mL/min (by C-G formula based on SCr of 1.03 mg/dL).   Medical History: Past Medical History:  Diagnosis Date  . Anxiety 10/11  . Aortic stenosis    a. s/p mechcanical AVR, 2002; b. 10/2018 Echo: Triv AI, mean grad 11/2018.  . Bradycardia    chronic, no symptoms 07/2010  . C. difficile colitis   . Carotid bruit    dopplers in past, no abnormalities  . Chronic diastolic CHF (congestive heart failure) (HCC)    a. Echo 03/2015: EF 60-65%, no RWMA, GR1DD, mild BAE, mild to mod MR, mod TR, PASP 65 mmHg; b. 01/2017 Echo: EF 55-60%, NRWMA, grade 1 diastolic dysfunction.  Normal functioning prosthetic aortic valve.  Mean gradient 50 mmHg.  Sev TR. PASP 02/2017; c. 10/2018 Echo: EF 55-60%, Triv AI, mod dil LA, mod-sev TR, PASP 35-40, mild to mod red RV fxn.  . Coronary artery disease    a. mild, cath, 08/2010; b. medically managed  . Decreased hearing    Right ear  . Depression   . Gastric ulcer   . GERD (gastroesophageal reflux disease)   . Hypertension    BP higher than usual 04/19/10; amlodipine  increased by telephone  . Mod-Sev Tricuspid regurgitation    a. 10/2018 Echo: Mod-Sev TR, PASP 35-25mmHg.  43m RLS (restless legs syndrome) 08/23/2015  . S/P AVR    a. St. Jude. mechanical 2002; b. echo 08/2010 EF 60%, trival AI, mild MR, AVR working well; c. on longterm warfarin tx  . SOB (shortness of breath) 10/11   08/2010,Episodes at 5 AM, eventually felt to be anxiety, after complete workup including catheterization, pt greatly improved with anxiety meds 11/11    Medications:  PTA warfarin 5mg  daily  Assessment: Pharmacy has been consulted for warfarin dosing and monitoring in an 86yo male with a PMH significant for aortic stenosis s/p St. Jude mechanical AVR in 2002 (on Coumadin), chronic diastolic HF, HTN, CAD, chronic venous stasis. Upon admission patient Hgb 4, Hct 14, and INR >10; rectal exam revealed heme positive stools. Patient received 2 units pRBC, 10mg  IV vitamin K x1, Kcentra, and 1 unit FFP. H&H has improved since admission.    warfarin:  Date   INR  Dose 09/04   1.5   NONE 09/05   1.4   2.5 mg 09/06   1.5   2.5 mg 09/07   2.1   NONE  (2.5 mg vit K po) 09/08   2.4   NONE 09/09   2.1   2.5 mg 09/10   2.5   1 mg   09/11   3.1   NONE 09/12   3.3   1.5 mg 09/13   2.9   1 mg 09/14   2.6   2.5 mg 09/15   2.6   2.5 mg   Goal of Therapy:  INR 2.5-3.5 (per Coumadin clinic) Monitor platelets by anticoagulation protocol: Yes   Plan:   Repeat warfarin 2.5 mg po x 1 (dose reduced due to sensitive response and supratherapeutic admission INR on home dose of 5mg )  INR in am  , PharmD, BCPS Clinical Pharmacist 08/09/2020 7:16 AM

## 2020-08-09 NOTE — Consult Note (Signed)
Consultation Note Date: 08/09/2020   Patient Name: [name]  DOB: 23-Feb-1931  MRN: [id number]  Age / Sex: 86 y.o., male   PCP: [name] Hire, MD Referring Physician: Jennye Boroughs, MD   REASON FOR CONSULTATION:Establishing goals of care  Palliative Care consult requested for goals of care discussion in this 86 y.o. male with multiple medical problems including aortic stenosis s/p mechanical TAVR on Coumadin, chronic diastolic heart failure, hypertension, CAD, chronic venous stasis, GERD and history of C. Difficile. Patient presented from [facility] facility to ED with complaints of shortness of breath and altered mental status.  EMS arrived with patient on nonrebreather mask due to oxygen levels in the low 80s.  During ED work-up hemoglobin of 4 and hematocrit of 13.  INR 10.  Rectal exam heme positive.  Patient received 2 units of packed RBCs, vitamin K, and 1 unit of FFP in addition to IV fluids.  Patient followed by GI s/p colonoscopy with likely diverticular bleeding and chronic iron deficiency anemia.  Patient continues to have intermittent confusion and wheezing.  Receiving IV Lasix for support.  Clinical Assessment and Goals of Care: I have reviewed medical records including lab results, imaging, Epic notes, and MAR, received report from the bedside RN, and assessed the patient. I met at the bedside with patient and his daughter/HCPOA [name] to discuss diagnosis prognosis, [place], EOL wishes, disposition and options.  I introduced Palliative Medicine as specialized medical care for people living with serious illness. It focuses on providing relief from the symptoms and stress of a serious illness. The goal is to improve quality of life for both the patient and the family.  Patient is awake, alert and oriented x3.  Currently eating lunch with daughter's assistance.  Noticeable wheezing and some dyspnea with exertion while attempting to reposition in bed.  Oxygen  saturations 96% on room air.  We discussed a brief life review of the patient, along with his functional and nutritional status.  Daughter reports patient and his wife have been married for over 63 years.  They have resided at [facility] for over 7 years in the independent living however wife is now under skilled care.  Daughter reports patient had great quality of life several weeks ago including driving going off to eat twice a week with his plans, and ability to provide all ADLs independently.  She reports significant decline over the past week.  We discussed His current illness and what it means in the larger context of His on-going co-morbidities. Natural disease trajectory and expectations at EOL were discussed.  Daughter verbalizes understanding and appreciation of detailed discussion.  She reports her and her brother remains hopeful for some improvement however they are also realistic and understanding patient's condition and advanced age.  Daughter reports they are hopeful patient will return back to [facility] facility but with awareness he may not be able to return back to his apartment in independent living given his current state of health and decline.  I attempted to elicit values and goals of care important to the patient.    The difference between aggressive medical intervention and comfort care was considered in light of the patient's goals of care.   Family requesting to continue to treat the treatable with all aggressive interventions to allow patient every opportunity to show improvement.  She is appreciative of patient's more awake and alertness on today however still with some noticeable intermittent confusion.  She shares patient only taking a few  bites and sips on yesterday however today he is eating majority of his food and awake and alert holding appropriate conversations in addition to remaining stable on room air.   We discussed in detail concepts specific to CODE STATUS,  artificial feeding and hydration, and rehospitalization.  Daughter reports she is patient's documented healthcare power of attorney however they have not completed an actual advanced directive and she is unaware if patient has documents.    I discussed in detail patient's full CODE STATUS with consideration of his current illness and comorbidities.  Daughter verbalizes understanding expressing patient has previously expressed he would like to attempt made however I have no chance of a meaningful recovery he would not wish to be prolonged by artificial feedings or life support.  Daughter expressed she would like to continue this discussion with her brother as they both would not want to prolong his life or for patient to undergo resuscitation knowing the frailty of his condition.  She reports she has also somewhat hopeful patient would be able to engage in discussions with her and her brother at a later date and time and express his wishes allowing them to be able to make a decision together.  I reviewed the advance directive packet and MOST form with daughter.  She requested to keep these documents and review further with her brother.  At this time [name] is requesting her father remain a full code.  Hospice and Palliative Care services outpatient were explained and offered. Patient and family verbalized their understanding and awareness of both palliative and hospice's goals and philosophy of care.  Daughter verbalizes understanding and is requesting outpatient palliative support with a goal of continuing with medical interventions.  Questions and concerns were addressed.  Hard Choices booklet left for review. The family was encouraged to call with questions or concerns.  PMT will continue to support holistically.   SOCIAL HISTORY:     reports that he quit smoking about 42 years ago. His smoking use included cigarettes. He has never used smokeless tobacco. He reports current alcohol use of about 3.0  standard drinks of alcohol per week. He reports that he does not use drugs.  CODE STATUS: Full code  ADVANCE DIRECTIVES: Primary Decision Maker: [name] (daughter/HCPOA)   SYMPTOM MANAGEMENT: Per attending  Palliative Prophylaxis:   Aspiration, Bowel Regimen and Frequent Pain Assessment  PSYCHO-SOCIAL/SPIRITUAL:  Support System: Family  Desire for further Chaplaincy support: No  Additional Recommendations (Limitations, Scope, Preferences):  Full Scope Treatment and Treat the treatable  Education on hospice/palliative    PAST MEDICAL HISTORY: Past Medical History:  Diagnosis Date  . Anxiety 10/11  . Aortic stenosis    a. s/p mechcanical AVR, 2002; b. 10/2018 Echo: Triv AI, mean grad 68mHg.  . Bradycardia    chronic, no symptoms 07/2010  . C. difficile colitis   . Carotid bruit    dopplers in past, no abnormalities  . [place]    a. Echo 03/2015: EF 60-65%, no RWMA, GR1DD, mild BAE, mild to mod MR, mod TR, PASP 65 mmHg; b. 01/2017 Echo: EF 55-60%, NRWMA, grade 1 diastolic dysfunction.  Normal functioning prosthetic aortic valve.  Mean gradient 50 mmHg.  Sev TR. PASP 574mg; c. 10/2018 Echo: EF 55-60%, Triv AI, mod dil LA, mod-sev TR, PASP 35-40, mild to mod red RV fxn.  . Coronary artery disease    a. mild, cath, 08/2010; b. medically managed  . Decreased hearing    Right  ear  . Depression   . Gastric ulcer   . GERD (gastroesophageal reflux disease)   . Hypertension    BP higher than usual 04/19/10; amlodipine increased by telephone  . Mod-Sev Tricuspid regurgitation    a. 10/2018 Echo: Mod-Sev TR, PASP 35-52mmHg.  Marland Kitchen RLS (restless legs syndrome) 08/23/2015  . S/P AVR    a. St. Jude. mechanical 2002; b. echo 08/2010 EF 60%, trival AI, mild MR, AVR working well; c. on longterm warfarin tx  . SOB (shortness of breath) 10/11   08/2010,Episodes at 5 AM, eventually felt to be anxiety, after complete workup including  catheterization, pt greatly improved with anxiety meds 11/11    ALLERGIES:  is allergic to sulfa antibiotics and levofloxacin.   MEDICATIONS:  Current Facility-Administered Medications  Medication Dose Route Frequency Provider Last Rate Last Admin  . acetaminophen (TYLENOL) tablet 1,000 mg  1,000 mg Oral Q6H PRN Sharion Settler, NP   1,000 mg at 08/08/20 2122  . ascorbic acid (VITAMIN C) tablet 250 mg  250 mg Oral BID Nicole Kindred A, DO   250 mg at 08/09/20 9518  . bisacodyl (DULCOLAX) suppository 10 mg  10 mg Rectal Daily PRN Pickenpack-Cousar, Danahi Reddish N, NP      . busPIRone (BUSPAR) tablet 5 mg  5 mg Oral BID [name] Boroughs, MD      . docusate sodium (COLACE) capsule 100 mg  100 mg Oral BID [name] Boroughs, MD   100 mg at 08/09/20 1221  . DULoxetine (CYMBALTA) DR capsule 30 mg  30 mg Oral Daily Dallie Piles, RPH   30 mg at 08/09/20 8416  . feeding supplement (ENSURE ENLIVE) (ENSURE ENLIVE) liquid 237 mL  237 mL Oral BID BM Nicole Kindred A, DO   237 mL at 08/09/20 1221  . folic acid (FOLVITE) tablet 1 mg  1 mg Oral Daily Sharion Settler, NP   1 mg at 08/09/20 0950  . guaiFENesin (MUCINEX) 12 hr tablet 600 mg  600 mg Oral BID Nicole Kindred A, DO   600 mg at 08/09/20 0950  . guaiFENesin-dextromethorphan (ROBITUSSIN DM) 100-10 MG/5ML syrup 5 mL  5 mL Oral Q4H PRN Nicole Kindred A, DO   5 mL at 08/07/20 1423  . ipratropium-albuterol (DUONEB) 0.5-2.5 (3) MG/3ML nebulizer solution 3 mL  3 mL Nebulization Q6H WA Griffith, Kelly A, DO   3 mL at 08/09/20 0901  . menthol-cetylpyridinium (CEPACOL) lozenge 3 mg  1 lozenge Oral PRN Sharion Settler, NP      . metoprolol succinate (TOPROL-XL) 24 hr tablet 12.5 mg  12.5 mg Oral Daily Agbata, Tochukwu, MD   12.5 mg at 08/09/20 0950  . morphine 2 MG/ML injection 1 mg  1 mg Intravenous Once Nicole Kindred A, DO      . multivitamin with minerals tablet 1 tablet  1 tablet Oral Daily Sharion Settler, NP   1 tablet at 08/09/20 0950  . ondansetron  (ZOFRAN) tablet 4 mg  4 mg Oral Q6H PRN Agbata, Tochukwu, MD       Or  . ondansetron (ZOFRAN) injection 4 mg  4 mg Intravenous Q6H PRN Agbata, Tochukwu, MD      . rOPINIRole (REQUIP) tablet 2 mg  2 mg Oral QID Lucrezia Starch, MD   2 mg at 08/09/20 0950  . thiamine tablet 100 mg  100 mg Oral Daily Sharion Settler, NP   100 mg at 08/09/20 0951  . torsemide (DEMADEX) tablet 40 mg  40 mg Oral Daily [name] Boroughs, MD  40 mg at 08/09/20 1221  . traZODone (DESYREL) tablet 50 mg  50 mg Oral QHS Esaw Grandchild A, DO   50 mg at 08/08/20 2122  . Warfarin - Pharmacist Dosing Inpatient   Does not apply q1600 Lowella Bandy, [facility]   Given at 08/04/20 1718    VITAL SIGNS: BP 126/64   Pulse (!) 49   Temp (!) 97.4 F (36.3 C) (Oral)   Resp 20   Ht 5\' 11"  (1.803 m)   Wt 104.3 kg   SpO2 90%   BMI 32.08 kg/m  Filed Weights   08/05/20 0445 08/06/20 0448 08/07/20 0408  Weight: 108.4 kg 104.3 kg 104.3 kg    Estimated body mass index is 32.08 kg/m as calculated from the following:   Height as of this encounter: 5\' 11"  (1.803 m).   Weight as of this encounter: 104.3 kg.  LABS: CBC:    Component Value Date/Time   WBC 6.9 08/09/2020 0512   HGB 8.4 (L) 08/09/2020 0512   HGB 14.2 01/03/2016 1515   HCT 27.5 (L) 08/09/2020 0512   HCT 42.3 01/03/2016 1515   PLT 278 08/09/2020 0512   PLT 222 01/03/2016 1515   Comprehensive Metabolic Panel:    Component Value Date/Time   NA 144 08/09/2020 0512   NA 139 10/25/2019 1157   NA 129 (L) 03/23/2015 0429   K 4.1 08/09/2020 0512   K 3.7 03/23/2015 0429   BUN 17 08/09/2020 0512   BUN 35 (H) 10/25/2019 1157   BUN 19 03/23/2015 0429   CREATININE 1.03 08/09/2020 0512   CREATININE 0.72 03/23/2015 0429   ALBUMIN 2.5 (L) 07/29/2020 0903   ALBUMIN 4.1 07/28/2014 1627     Review of Systems  Unable to perform ROS: Mental status change   Physical Exam General: NAD, frail chronically-ill appearing Cardiovascular: regular rate and rhythm Pulmonary:  Bilateral wheezing Abdomen: soft, nontender, + bowel sounds Extremities: no edema, no joint deformities Skin: no rashes, warm and dry, chronic changes bilateral lower extremity Neurological: Alert and oriented x3, mood appropriate, will follow commands, some intermittent confusion  Prognosis: Guarded   Discharge Planning:  Skilled Nursing Facility for rehab with Palliative care service follow-up  Recommendations: . Full Code-as requested/confirmed by 09/28/2020 (daughter/HCPOA).  Reviewed in detail MOST form and advanced directives as requested.  Daughter would like to continue discussions with brother and hopeful that patient will be able to engage in discussions at a later time. . Continue with current plan of care per medical team  . Family remains hopeful for some improvement/stability however is also preparing for the worse and realistic and understanding patient may have new baseline or condition may continue to further decline.  Requesting patient return back to [facility] skilled nursing facility with outpatient palliative support.  [facility] referral for outpatient palliative) . Encourage family to continue with ongoing goals of care discussions addressing CODE STATUS and care expectations in the setting of further decline.  [facility] PMT will continue to support and follow. Please call team line with urgent needs.   Palliative Performance Scale: PPS 30%             Daughter expressed understanding and was in agreement with this plan.   Thank you for allowing the Palliative Medicine Team to assist in the care of this patient.  Time In: 1230 Time Out: 1340 Time Total: 70 min.   Visit consisted of counseling and education dealing with the complex and emotionally intense issues of symptom management and palliative  care in the setting of serious and potentially life-threatening illness.Greater than 50%  of this time was spent counseling and coordinating care related to the above assessment and  plan.  Signed by:  Alda Lea, AGPCNP-BC Palliative Medicine Team  Phone: [phone] Pager: [phone] Amion: Bjorn Pippin

## 2020-08-09 NOTE — Progress Notes (Signed)
[facility] Liaison Note:  New referral for [employer] community Palliative to follow at [facility] received from Palliative NP [employer]. TOC Charlynn Court aware. Patient information given to referral. Dayna Barker BSN,, [facility] Liaison [facility] [phone]

## 2020-08-09 NOTE — TOC Progression Note (Addendum)
Transition of Care [facility]) - Progression Note    Patient Details  Name: [name] MRN: [id number] Date of Birth: 11-Nov-1931  Transition of Care [facility]) CM/SW Contact  Margarito Liner, LCSW Phone Number: 08/09/2020, 11:38 AM  Clinical Narrative:  Per MD, patient might be able to discharge to [facility] today. CSW confirmed with admissions coordinator that they do have a bed available but patient needs to be without the telesitter for 24 hours before he admits. It was discontinued last night at 8:10 pm so they will accept him tomorrow if stable. They also want his hemoglobin rechecked in the morning. Uploaded clinicals into [employer] portal to start insurance authorization. Sent secure chat to MD requesting a COVID test.   11:47 am: Insurance authorization approved. [employer] reference number is [id number]. Valid through Friday.  1:09 pm: Berkley Harvey number has generated: [id number].  Expected Discharge Plan: Skilled Nursing Facility Barriers to Discharge: Continued Medical Work up  Expected Discharge Plan and Services Expected Discharge Plan: Skilled Nursing Facility   Discharge Planning Services: CM Consult   Living arrangements for the past 2 months: Skilled Nursing Facility                                       Social Determinants of Health (SDOH) Interventions    Readmission Risk Interventions Readmission Risk Prevention Plan 08/04/2020 07/12/2020  Transportation Screening Complete Complete  PCP or Specialist Appt within 3-5 Days - Complete  HRI or Home Care Consult (No Data) -  Social Work Consult for Recovery Care Planning/Counseling Complete Complete  Palliative Care Screening Not Applicable Not Applicable  Medication Review Oceanographer) Complete Complete  Some recent data might be hidden

## 2020-08-09 NOTE — Progress Notes (Addendum)
Progress Note    [name]  YPP:[id number] DOB: Apr 21, 1931  DOA: 07/29/2020 PCP: Gracelyn Nurse, MD      Brief Narrative:    Medical records reviewed and are as summarized below:  [name] is a 86 y.o. male       Assessment/Plan:   Principal Problem:   Acute blood loss anemia Active Problems:   Depression   Aortic stenosis   History of mechanical aortic valve replacement   Chronic diastolic CHF (congestive heart failure) (HCC)   H/O atrial flutter   Lactic acidosis   Hemorrhagic shock (HCC)   Pressure injury of skin   Malnutrition of moderate degree Acute GI bleeding, likely due to diverticular bleed Stage II sacral decubitus ulcer-unknown if present on admission  Nutrition Problem: Moderate Malnutrition Etiology: social / environmental circumstances (poor appetite, advanced age)  Signs/Symptoms: moderate muscle depletion, severe muscle depletion, moderate fat depletion, severe fat depletion   Body mass index is 32.08 kg/m.  (Obesity)  PLAN  Mental status is improving.  Tele sitter was discontinued last night.  He has to be off of tele sitter for 24 hours prior to discharge to SNF. Resume torsemide for CHF He was weaned off oxygen to room air today.  Continue to monitor oxygen. Hemoglobin level continues to trend down.  Repeat H&H tomorrow Continue warfarin for CAD, A. fib/a flutter and single Jude aortic valve Plan discussed with patient and his daughter at the bedside.  Pressure Injury 08/02/20 Sacrum Stage 2 -  Partial thickness loss of dermis presenting as a shallow open injury with a red, pink wound bed without slough. (Active)  08/02/20 1600  Location: Sacrum  Location Orientation:   Staging: Stage 2 -  Partial thickness loss of dermis presenting as a shallow open injury with a red, pink wound bed without slough.  Wound Description (Comments):   Present on Admission:         Diet Order            DIET SOFT Room  service appropriate? Yes; Fluid consistency: Thin  Diet effective now                    Consultants:  Cardiologist  Procedures:  Colonoscopy on 08/03/2020 showing diverticulosis, nonbleeding internal hemorrhoids and blood in the sigmoid colon.    Medications:   . vitamin C  250 mg Oral BID  . busPIRone  5 mg Oral BID  . docusate sodium  100 mg Oral BID  . DULoxetine  30 mg Oral Daily  . feeding supplement (ENSURE ENLIVE)  237 mL Oral BID BM  . folic acid  1 mg Oral Daily  . guaiFENesin  600 mg Oral BID  . ipratropium-albuterol  3 mL Nebulization Q6H WA  . metoprolol succinate  12.5 mg Oral Daily  .  morphine injection  1 mg Intravenous Once  . multivitamin with minerals  1 tablet Oral Daily  . [START ON 08/10/2020] polyethylene glycol  17 g Oral Daily  . rOPINIRole  2 mg Oral QID  . thiamine  100 mg Oral Daily  . torsemide  40 mg Oral Daily  . traZODone  50 mg Oral QHS  . Warfarin - Pharmacist Dosing Inpatient   Does not apply q1600   Continuous Infusions:   Anti-infectives (From admission, onward)   Start     Dose/Rate Route Frequency Ordered Stop   07/30/20 1300  vancomycin (VANCOREADY) IVPB 1500 mg/300 mL  Status:  Discontinued        1,500 mg 150 mL/hr over 120 Minutes Intravenous Every 24 hours 07/29/20 1232 07/29/20 1400   07/30/20 0000  ceFEPIme (MAXIPIME) 2 g in sodium chloride 0.9 % 100 mL IVPB  Status:  Discontinued        2 g 200 mL/hr over 30 Minutes Intravenous Every 12 hours 07/29/20 1201 07/29/20 1400   07/29/20 2200  piperacillin-tazobactam (ZOSYN) IVPB 3.375 g  Status:  Discontinued        3.375 g 12.5 mL/hr over 240 Minutes Intravenous Every 8 hours 07/29/20 1402 07/30/20 0743   07/29/20 1330  vancomycin (VANCOCIN) IVPB 1000 mg/200 mL premix  Status:  Discontinued        1,000 mg 200 mL/hr over 60 Minutes Intravenous  Once 07/29/20 1232 07/29/20 1400   07/29/20 0945  ceFEPIme (MAXIPIME) 2 g in sodium chloride 0.9 % 100 mL IVPB        2 g 200  mL/hr over 30 Minutes Intravenous  Once 07/29/20 0939 07/29/20 1207   07/29/20 0945  metroNIDAZOLE (FLAGYL) IVPB 500 mg        500 mg 100 mL/hr over 60 Minutes Intravenous  Once 07/29/20 0939 07/29/20 1343   07/29/20 0945  vancomycin (VANCOCIN) IVPB 1000 mg/200 mL premix        1,000 mg 200 mL/hr over 60 Minutes Intravenous  Once 07/29/20 4650 07/29/20 1722             Family Communication/Anticipated D/C date and plan/Code Status   DVT prophylaxis: SCDs Start: 07/29/20 1352     Code Status: Full Code  Family Communication: Plan discussed with patient Disposition Plan:    Status is: Inpatient  Remains inpatient appropriate because:Unsafe d/c plan and Inpatient level of care appropriate due to severity of illness   Dispo:  Patient From: Assisted Living Facility  Planned Disposition: Skilled Nursing Facility  Expected discharge date: 08/10/20  Medically stable for discharge: No.  Hemoglobin is trending down.  Need to repeat hemoglobin tomorrow morning to make sure is stable prior to discharge.  His daughter is now comfortable with him going to SNF today.  He had a tele sitter that was discontinued last night.  He is to be without telesitter for 24 hours before he can be discharged to SNF.           Subjective:   He has been wheezing since morning.  No shortness of breath or chest pain.  His daughter [name]) and his parents are both at the bedside.  Objective:    Vitals:   08/09/20 0901 08/09/20 0950 08/09/20 1000 08/09/20 1140  BP:  (!) 145/70  126/64  Pulse:  76  (!) 49  Resp:  20  20  Temp:  (!) 97.5 F (36.4 C)  (!) 97.4 F (36.3 C)  TempSrc:  Oral  Oral  SpO2: 99% 100% 100% 90%  Weight:      Height:       No data found.   Intake/Output Summary (Last 24 hours) at 08/09/2020 1432 Last data filed at 08/09/2020 1232 Gross per 24 hour  Intake --  Output 50 ml  Net -50 ml   Filed Weights   08/05/20 0445 08/06/20 0448 08/07/20 0408  Weight: 108.4  kg 104.3 kg 104.3 kg    Exam:  GEN: NAD SKIN: Warm and dry.  Chronic skin changes on bilateral legs likely from chronic venous insufficiency.  Stage II sacral decubitus ulcer EYES: No pallor or icterus  ENT: MMM CV: RRR PULM: Bilateral expiratory wheezing.  No rales heard. ABD: soft, ND, NT, +BS CNS: AAO x 3, non focal EXT: No edema or tenderness   Data Reviewed:   I have personally reviewed following labs and imaging studies:  Labs: Labs show the following:   Basic Metabolic Panel: Recent Labs  Lab 08/03/20 0449 08/04/20 0517 08/05/20 0635 08/05/20 0635 08/06/20 0624 08/06/20 0624 08/07/20 0701 08/07/20 0701 08/08/20 0637 08/09/20 0512  NA 148*   < > 148*  --  150*  --  147*  --  145 144  K 3.5   < > 3.8   < > 4.3   < > 3.7   < > 3.4* 4.1  CL 111   < > 113*  --  114*  --  113*  --  110 109  CO2 26   < > 23  --  20*  --  25  --  25 25  GLUCOSE 105*   < > 105*  --  102*  --  112*  --  97 98  BUN 36*   < > 27*  --  24*  --  18  --  18 17  CREATININE 1.00   < > 1.17  --  1.09  --  0.99  --  0.97 1.03  CALCIUM 8.5*   < > 8.1*  --  8.3*  --  8.0*  --  7.9* 8.1*  MG 3.0*  --  2.5*  --  2.6*  --   --   --   --  2.6*   < > = values in this interval not displayed.   GFR Estimated Creatinine Clearance: 63.3 mL/min (by C-G formula based on SCr of 1.03 mg/dL). Liver Function Tests: Recent Labs  Lab 08/05/20 1901  AST 33  ALT 21   No results for input(s): LIPASE, AMYLASE in the last 168 hours. Recent Labs  Lab 08/05/20 1901  AMMONIA 11   Coagulation profile Recent Labs  Lab 08/05/20 0635 08/06/20 1239 08/07/20 0701 08/08/20 0637 08/09/20 0512  INR 3.1* 3.3* 2.9* 2.6* 2.6*    CBC: Recent Labs  Lab 08/05/20 0635 08/05/20 1901 08/06/20 0624 08/06/20 0624 08/07/20 0701 08/07/20 0701 08/07/20 1400 08/08/20 0637 08/08/20 1406 08/08/20 2142 08/09/20 0512  WBC 10.9*  --  11.0*  --  10.1  --   --  8.0  --   --  6.9  HGB 7.5*   < > 10.3*   < > 9.0*   <  > 9.0* 8.4* 8.6* 8.9* 8.4*  HCT 25.4*   < > 32.2*   < > 29.3*   < > 28.0* 27.5* 28.0* 29.0* 27.5*  MCV 95.8  --  90.2  --  92.7  --   --  93.2  --   --  93.9  PLT 266  --  139*  --  295  --   --  274  --   --  278   < > = values in this interval not displayed.   Cardiac Enzymes: No results for input(s): CKTOTAL, CKMB, CKMBINDEX, TROPONINI in the last 168 hours. BNP (last 3 results) No results for input(s): PROBNP in the last 8760 hours. CBG: Recent Labs  Lab 08/02/20 1624 08/02/20 2031 08/03/20 2040 08/04/20 0730 08/04/20 2337  GLUCAP 99 110* 108* 94 138*   D-Dimer: No results for input(s): DDIMER in the last 72 hours. Hgb A1c: No results for input(s): HGBA1C in the last  72 hours. Lipid Profile: No results for input(s): CHOL, HDL, LDLCALC, TRIG, CHOLHDL, LDLDIRECT in the last 72 hours. Thyroid function studies: No results for input(s): TSH, T4TOTAL, T3FREE, THYROIDAB in the last 72 hours.  Invalid input(s): FREET3 Anemia work up: No results for input(s): VITAMINB12, FOLATE, FERRITIN, TIBC, IRON, RETICCTPCT in the last 72 hours. Sepsis Labs: Recent Labs  Lab 08/05/20 0635 08/05/20 1222 08/05/20 1901 08/06/20 0624 08/07/20 0701 08/07/20 1400 08/08/20 0637 08/09/20 0512  PROCALCITON  --  <0.10  --   --   --  <0.10 <0.10 <0.10  WBC   < >  --   --  11.0* 10.1  --  8.0 6.9  LATICACIDVEN  --  1.5 1.4  --   --   --   --   --    < > = values in this interval not displayed.    Microbiology Recent Results (from the past 240 hour(s))  MRSA PCR Screening     Status: None   Collection Time: 07/31/20  8:25 PM   Specimen: Nasal Mucosa; Nasopharyngeal  Result Value Ref Range Status   MRSA by PCR NEGATIVE NEGATIVE Final    Comment:        The GeneXpert MRSA Assay (FDA approved for NASAL specimens only), is one component of a comprehensive MRSA colonization surveillance program. It is not intended to diagnose MRSA infection nor to guide or monitor treatment for MRSA  infections. Performed at [facility], [address] [place], [place] [address]   Culture, blood (single) w Reflex to ID Panel     Status: None (Preliminary result)   Collection Time: 08/05/20 12:22 PM   Specimen: BLOOD  Result Value Ref Range Status   Specimen Description BLOOD RIGHT ANTECUBITAL  Final   Special Requests   Final    BOTTLES DRAWN AEROBIC AND ANAEROBIC Blood Culture results may not be optimal due to an excessive volume of blood received in culture bottles   Culture   Final    NO GROWTH 4 DAYS Performed at [facility], [address] [place], [place] [address]    Report Status PENDING  Incomplete    Procedures and diagnostic studies:  No results found.             LOS: 11 days   Luvenia Cranford  [facility]   Pager on [email or web address]. If 7PM-7AM, please contact night-coverage at [email or web address]     08/09/2020, 2:32 PM

## 2020-08-10 LAB — CBC WITH DIFFERENTIAL/PLATELET
Abs Immature Granulocytes: 0.03 10*3/uL (ref 0.00–0.07)
Basophils Absolute: 0 10*3/uL (ref 0.0–0.1)
Basophils Relative: 1 %
Eosinophils Absolute: 0.2 10*3/uL (ref 0.0–0.5)
Eosinophils Relative: 3 %
HCT: 31 % — ABNORMAL LOW (ref 39.0–52.0)
Hemoglobin: 9.6 g/dL — ABNORMAL LOW (ref 13.0–17.0)
Immature Granulocytes: 0 %
Lymphocytes Relative: 15 %
Lymphs Abs: 1 10*3/uL (ref 0.7–4.0)
MCH: 28.7 pg (ref 26.0–34.0)
MCHC: 31 g/dL (ref 30.0–36.0)
MCV: 92.5 fL (ref 80.0–100.0)
Monocytes Absolute: 0.7 10*3/uL (ref 0.1–1.0)
Monocytes Relative: 9 %
Neutro Abs: 5.1 10*3/uL (ref 1.7–7.7)
Neutrophils Relative %: 72 %
Platelets: 287 10*3/uL (ref 150–400)
RBC: 3.35 MIL/uL — ABNORMAL LOW (ref 4.22–5.81)
RDW: 18.6 % — ABNORMAL HIGH (ref 11.5–15.5)
WBC: 7 10*3/uL (ref 4.0–10.5)
nRBC: 0 % (ref 0.0–0.2)

## 2020-08-10 LAB — BASIC METABOLIC PANEL
Anion gap: 10 (ref 5–15)
BUN: 17 mg/dL (ref 8–23)
CO2: 27 mmol/L (ref 22–32)
Calcium: 8.4 mg/dL — ABNORMAL LOW (ref 8.9–10.3)
Chloride: 106 mmol/L (ref 98–111)
Creatinine, Ser: 1.04 mg/dL (ref 0.61–1.24)
GFR calc Af Amer: >60 mL/min (ref >60)
GFR calc non Af Amer: >60 mL/min (ref >60)
Glucose, Bld: 95 mg/dL (ref 70–99)
Potassium: 4.1 mmol/L (ref 3.5–5.1)
Sodium: 143 mmol/L (ref 135–145)

## 2020-08-10 LAB — PROTIME-INR
INR: 3.2 — ABNORMAL HIGH (ref 0.8–1.2)
Prothrombin Time: 32.1 seconds — ABNORMAL HIGH (ref 11.4–15.2)

## 2020-08-10 LAB — VITAMIN B1: Vitamin B1 (Thiamine): 160.9 nmol/L (ref 66.5–200.0)

## 2020-08-10 LAB — CULTURE, BLOOD (SINGLE): Culture: NO GROWTH

## 2020-08-10 LAB — MAGNESIUM: Magnesium: 2.3 mg/dL (ref 1.7–2.4)

## 2020-08-10 MED ORDER — WARFARIN SODIUM 2.5 MG PO TABS: 2.5000 mg | ORAL_TABLET | Freq: Every day | ORAL | Status: DC

## 2020-08-10 MED ORDER — BUSPIRONE HCL 5 MG PO TABS: 5.0000 mg | ORAL_TABLET | Freq: Two times a day (BID) | ORAL | Status: AC

## 2020-08-10 MED ORDER — WARFARIN SODIUM 1 MG PO TABS
1.0000 mg | ORAL_TABLET | Freq: Once | ORAL | Status: DC
  Filled 2020-08-10: qty 1

## 2020-08-10 MED ORDER — ISOSORBIDE MONONITRATE ER 60 MG PO TB24
60.0000 mg | ORAL_TABLET | Freq: Every day | ORAL | 0 refills | Status: DC
Start: 2020-08-10 — End: 2020-09-13

## 2020-08-10 NOTE — Discharge Summary (Signed)
Physician Discharge Summary  [name] YIA:[id number] DOB: Oct 08, 1931 DOA: 07/29/2020  PCP: Gracelyn Nurse, MD  Admit date: 07/29/2020 Discharge date: 08/10/2020  Discharge disposition: Skilled nursing facility   Recommendations for Outpatient Follow-Up:   Follow-up physician at the nursing home within 3 days of discharge Check INR tomorrow, 08/11/2020 and adjust Coumadin dose accordingly.   Discharge Diagnosis:   Principal Problem:   Acute blood loss anemia Active Problems:   Depression   Aortic stenosis   History of mechanical aortic valve replacement   Chronic diastolic CHF (congestive heart failure) (HCC)   H/O atrial flutter   Lactic acidosis   Hemorrhagic shock (HCC)   Pressure injury of skin   Malnutrition of moderate degree    Discharge Condition: Stable.  Diet recommendation:  Diet Order            Diet - low sodium heart healthy           DIET SOFT Room service appropriate? Yes; Fluid consistency: Thin  Diet effective now                   Code Status: Full Code     Hospital Course:   [name] is a 86 y.o. male with medical history significant foraortic stenosis s/p mechanical TAVR on Coumadin, chronic diastolic heart failure, hypertension, CAD, chronic venous stasis, chronic anemia, GERD and history of C. difficile. He presented to the hospital with lethargy, shortness of breath and decreased oxygen saturation in the low 80s on room air.  Work-up revealed a hemoglobin of 4, INR greater than 10 and rectal exam showed positive heme stools.  He was admitted to the hospital for Coumadin coagulopathy, acute blood loss anemia secondary to acute GI bleeding (likely diverticular bleeding) and acute metabolic encephalopathy.  He was treated with IV vitamin K, Kcentra and 2 units of FFP.  He was also treated with IV Protonix.  He was seen in consultation by the gastroenterologist.  EGD was done on 07/30/2020 and his revealed large  hiatal hernia but there was no evidence of bleeding.  He underwent colonoscopy on 08/03/2020 and this showed nonbleeding internal hemorrhoids, diverticulosis in the sigmoid colon descending colon, stool in the entire examined colon and blood in the sigmoid colon.  He also received IV Lasix for acute on chronic diastolic CHF.  He was seen in consultation by the cardiologist as well.  He was evaluated by the neurologist because of acute metabolic encephalopathy.  EEG was performed on 08/07/2020 and study was suggestive of mild diffuse encephalopathy.  No seizures or epileptiform discharges were seen throughout the recording.  His condition slowly improved.  Warfarin has been resumed.  He is deemed stable for discharge to SNF today.  INR is 3.2 today.  Warfarin will be held today.  INR should be repeated tomorrow and decision will be made as to whether warfarin can be resumed tomorrow or not. INR should be monitored closely at the skilled nursing facility and Coumadin dose should be adjusted accordingly.  Discharge plan was discussed with the patient and his daughter, [name] (over the phone).   Medical Consultants:    Gastroenterologist  Cardiologist  Neurologist   Discharge Exam:    Vitals:   08/09/20 1500 08/09/20 1940 08/09/20 2006 08/10/20 0610  BP:   103/81 126/62  Pulse:   82 (!) 102  Resp:   20 20  Temp:   98 F (36.7 C) (!) 97.5 F (36.4 C)  TempSrc:  Oral  SpO2: 91% 100% 99% 94%  Weight:      Height:         GEN: NAD SKIN: Chronic skin changes/discoloration on bilateral legs suggestive of chronic venous insufficiency.  Venous stasis wounds on bilateral legs.  Stage II sacral decubitus ulcer. EYES: EOMI ENT: MMM CV: RRR PULM: CTA B ABD: soft, ND, NT, +BS CNS: AAO x 3, non focal EXT: No edema or tenderness   The results of significant diagnostics from this hospitalization (including imaging, microbiology, ancillary and laboratory) are listed below for reference.      Procedures and Diagnostic Studies:   EEG  Result Date: 08/07/2020 Charlsie Quest, MD     08/07/2020  2:07 PM Patient Name: [name] MRN: [id number] Epilepsy Attending: Charlsie Quest Referring Physician/Provider: Dr Caryl Pina Date: 08/07/2020 Duration: 28.05 mins Patient history: 86 year old male with progressively worsening delirium in the setting of GIB. EEG to evaluate for seizure. Level of alertness: Awake AEDs during EEG study: None Technical aspects: This EEG study was done with scalp electrodes positioned according to the 10-20 International system of electrode placement. Electrical activity was acquired at a sampling rate of  and reviewed with a high frequency filter of  and a low frequency filter of . EEG data were recorded continuously and digitally stored. Description: The posterior dominant rhythm consists of 7 Hz activity of moderate voltage (25-35 uV) seen predominantly in posterior head regions, symmetric and reactive to eye opening and eye closing. EEG showed continuous generalized 3 to 6 Hz theta-delta slowing. Hyperventilation and photic stimulation were not performed.   ABNORMALITY -Continuous slow, generalized IMPRESSION: This study is suggestive of mild diffuse encephalopathy, nonspecific etiology. No seizures or epileptiform discharges were seen throughout the recording. Charlsie Quest   DG Chest Port 1 View  Result Date: 08/07/2020 CLINICAL DATA:  Hypoxia. EXAM: PORTABLE CHEST 1 VIEW COMPARISON:  August 06, 2020. FINDINGS: Stable cardiomediastinal silhouette. Status post cardiac valve repair. No pneumothorax is noted. Increased bilateral perihilar and basilar opacities are noted concerning for worsening pulmonary edema with probable small pleural effusions. Bony thorax is unremarkable. IMPRESSION: Increased bilateral perihilar and basilar opacities are noted concerning for worsening pulmonary edema with probable small pleural effusions.  Electronically Signed   By: Lupita Raider M.D.   On: 08/07/2020 14:40   DG Chest Port 1 View  Result Date: 08/06/2020 CLINICAL DATA:  Hypoxia.  Colonoscopy earlier in the day EXAM: PORTABLE CHEST 1 VIEW COMPARISON:  August 04, 2020 FINDINGS: There is cardiomegaly with pulmonary venous hypertension. There is mild interstitial edema. There is no consolidation. Patient is status post aortic valve replacement. There is aortic atherosclerosis. No adenopathy. No pneumoperitoneum evident portable radiographic examination. IMPRESSION: Cardiomegaly with pulmonary vascular congestion. Mild interstitial edema. There may be volume overload/congestive heart failure. No consolidation. Status post aortic valve replacement. Aortic Atherosclerosis (ICD10-I70.0). Electronically Signed   By: Bretta Bang III M.D.   On: 08/06/2020 09:25   ECHOCARDIOGRAM COMPLETE  Result Date: 08/06/2020    ECHOCARDIOGRAM REPORT   Patient Name:   CHANTRY HEADEN [facility] Date of Exam: 08/06/2020 Medical Rec #:  [id number]            Height:       71.0 in Accession #:    [id number]           Weight:       230.0 lb Date of Birth:  01-12-1931  BSA:          2.238 m Patient Age:    86 years             BP:           155/82 mmHg Patient Gender: M                    HR:           108 bpm. Exam Location:  [facility] Procedure: 2D Echo Indications:     Dyspnea R06.00 s/p AVR  History:         Patient has prior history of Echocardiogram examinations, most                  recent 02/11/2020.  Sonographer:     Wonda Cerise RDCS Referring Phys:  [id number] Tresa Endo A GRIFFITH Diagnosing Phys: Julien Nordmann MD  Sonographer Comments: Technically challenging study due to limited acoustic windows, Technically difficult study due to poor echo windows, suboptimal parasternal window, suboptimal apical window and suboptimal subcostal window. Image acquisition challenging due to patient behavioral factors. and Image acquisition challenging due to uncooperative  patient. IMPRESSIONS  1. Left ventricular ejection fraction, by estimation, is 60 to 65%. The left ventricle has normal function. The left ventricle has no regional wall motion abnormalities. Left ventricular diastolic parameters are indeterminate.  2. Right ventricular systolic function is normal. The right ventricular size is mildly enlarged. There is mildly elevated pulmonary artery systolic pressure. The estimated right ventricular systolic pressure is 39.2 mmHg.  3. Mild mitral valve regurgitation.  4. Tricuspid valve regurgitation is moderate.  5. The aortic valve was not well visualized. Mechanical aortic valve. No aortic stenosis is present. Aortic valve mean gradient measures 9.0 mmHg.  6. Challenging images FINDINGS  Left Ventricle: Left ventricular ejection fraction, by estimation, is 60 to 65%. The left ventricle has normal function. The left ventricle has no regional wall motion abnormalities. The left ventricular internal cavity size was normal in size. There is  no left ventricular hypertrophy. Left ventricular diastolic parameters are indeterminate. Right Ventricle: The right ventricular size is mildly enlarged. No increase in right ventricular wall thickness. Right ventricular systolic function is normal. There is mildly elevated pulmonary artery systolic pressure. The tricuspid regurgitant velocity is 2.70 m/s, and with an assumed right atrial pressure of 10 mmHg, the estimated right ventricular systolic pressure is 39.2 mmHg. Left Atrium: Left atrial size was normal in size. Right Atrium: Right atrial size was normal in size. Pericardium: There is no evidence of pericardial effusion. Mitral Valve: The mitral valve is normal in structure. Mild mitral valve regurgitation. No evidence of mitral valve stenosis. Tricuspid Valve: The tricuspid valve is normal in structure. Tricuspid valve regurgitation is moderate . No evidence of tricuspid stenosis. Aortic Valve: The aortic valve was not well  visualized. Aortic valve regurgitation is not visualized. No aortic stenosis is present. Aortic valve mean gradient measures 9.0 mmHg. Aortic valve peak gradient measures 14.3 mmHg. There is a mechanical valve present in the aortic position. Pulmonic Valve: The pulmonic valve was normal in structure. Pulmonic valve regurgitation is not visualized. No evidence of pulmonic stenosis. Aorta: The aortic root is normal in size and structure. Venous: The inferior vena cava is normal in size with greater than 50% respiratory variability, suggesting right atrial pressure of 3 mmHg. IAS/Shunts: No atrial level shunt detected by color flow Doppler.  AORTIC VALVE AV Vmax:      189.00 cm/s AV Vmean:  135.000 cm/s AV VTI:       0.274 m AV Peak Grad: 14.3 mmHg AV Mean Grad: 9.0 mmHg TRICUSPID VALVE TV Peak grad:   29.2 mmHg TV Vmax:        2.70 m/s TR Peak grad:   29.2 mmHg TR Vmax:        270.00 cm/s Julien Nordmann MD Electronically signed by Julien Nordmann MD Signature Date/Time: 08/06/2020/12:34:27 PM    Final (Updated)      Labs:   Basic Metabolic Panel: Recent Labs  Lab 08/05/20 1610 08/05/20 9604 08/06/20 5409 08/06/20 8119 08/07/20 0701 08/07/20 0701 08/08/20 1478 08/08/20 2956 08/09/20 0512 08/10/20 0420  NA 148*   < > 150*  --  147*  --  145  --  144 143  K 3.8   < > 4.3   < > 3.7   < > 3.4*   < > 4.1 4.1  CL 113*   < > 114*  --  113*  --  110  --  109 106  CO2 23   < > 20*  --  25  --  25  --  25 27  GLUCOSE 105*   < > 102*  --  112*  --  97  --  98 95  BUN 27*   < > 24*  --  18  --  18  --  17 17  CREATININE 1.17   < > 1.09  --  0.99  --  0.97  --  1.03 1.04  CALCIUM 8.1*   < > 8.3*  --  8.0*  --  7.9*  --  8.1* 8.4*  MG 2.5*  --  2.6*  --   --   --   --   --  2.6* 2.3   < > = values in this interval not displayed.   GFR Estimated Creatinine Clearance: 62.7 mL/min (by C-G formula based on SCr of 1.04 mg/dL). Liver Function Tests: Recent Labs  Lab 08/05/20 1901  AST 33  ALT 21    No results for input(s): LIPASE, AMYLASE in the last 168 hours. Recent Labs  Lab 08/05/20 1901  AMMONIA 11   Coagulation profile Recent Labs  Lab 08/06/20 1239 08/07/20 0701 08/08/20 0637 08/09/20 0512 08/10/20 0420  INR 3.3* 2.9* 2.6* 2.6* 3.2*    CBC: Recent Labs  Lab 08/06/20 0624 08/06/20 0624 08/07/20 0701 08/07/20 1400 08/08/20 0637 08/08/20 1406 08/08/20 2142 08/09/20 0512 08/10/20 0420  WBC 11.0*  --  10.1  --  8.0  --   --  6.9 7.0  NEUTROABS  --   --   --   --   --   --   --   --  5.1  HGB 10.3*   < > 9.0*   < > 8.4* 8.6* 8.9* 8.4* 9.6*  HCT 32.2*   < > 29.3*   < > 27.5* 28.0* 29.0* 27.5* 31.0*  MCV 90.2  --  92.7  --  93.2  --   --  93.9 92.5  PLT 139*  --  295  --  274  --   --  278 287   < > = values in this interval not displayed.   Cardiac Enzymes: No results for input(s): CKTOTAL, CKMB, CKMBINDEX, TROPONINI in the last 168 hours. BNP: Invalid input(s): POCBNP CBG: Recent Labs  Lab 08/03/20 2040 08/04/20 0730 08/04/20 2337  GLUCAP 108* 94 138*   D-Dimer No results for input(s): DDIMER in  the last 72 hours. Hgb A1c No results for input(s): HGBA1C in the last 72 hours. Lipid Profile No results for input(s): CHOL, HDL, LDLCALC, TRIG, CHOLHDL, LDLDIRECT in the last 72 hours. Thyroid function studies No results for input(s): TSH, T4TOTAL, T3FREE, THYROIDAB in the last 72 hours.  Invalid input(s): FREET3 Anemia work up No results for input(s): VITAMINB12, FOLATE, FERRITIN, TIBC, IRON, RETICCTPCT in the last 72 hours. Microbiology Recent Results (from the past 240 hour(s))  MRSA PCR Screening     Status: None   Collection Time: 07/31/20  8:25 PM   Specimen: Nasal Mucosa; Nasopharyngeal  Result Value Ref Range Status   MRSA by PCR NEGATIVE NEGATIVE Final    Comment:        The GeneXpert MRSA Assay (FDA approved for NASAL specimens only), is one component of a comprehensive MRSA colonization surveillance program. It is not intended  to diagnose MRSA infection nor to guide or monitor treatment for MRSA infections. Performed at [facility], [address] [place], [place] [address]   Culture, blood (single) w Reflex to ID Panel     Status: None   Collection Time: 08/05/20 12:22 PM   Specimen: BLOOD  Result Value Ref Range Status   Specimen Description BLOOD RIGHT ANTECUBITAL  Final   Special Requests   Final    BOTTLES DRAWN AEROBIC AND ANAEROBIC Blood Culture results may not be optimal due to an excessive volume of blood received in culture bottles   Culture   Final    NO GROWTH 5 DAYS Performed at [facility], [address], [place], [place] [address]    Report Status 08/10/2020 FINAL  Final  SARS Coronavirus 2 by RT PCR (hospital order, performed in [facility] hospital lab) Nasopharyngeal Nasopharyngeal Swab     Status: None   Collection Time: 08/09/20  1:52 PM   Specimen: Nasopharyngeal Swab  Result Value Ref Range Status   SARS Coronavirus 2 NEGATIVE NEGATIVE Final    Comment: (NOTE) SARS-CoV-2 target nucleic acids are NOT DETECTED.  The SARS-CoV-2 RNA is generally detectable in upper and lower respiratory specimens during the acute phase of infection. The lowest concentration of SARS-CoV-2 viral copies this assay can detect is 250 copies / mL. A negative result does not preclude SARS-CoV-2 infection and should not be used as the sole basis for treatment or other patient management decisions.  A negative result may occur with improper specimen collection / handling, submission of specimen other than nasopharyngeal swab, presence of viral mutation(s) within the areas targeted by this assay, and inadequate number of viral copies (<250 copies / mL). A negative result must be combined with clinical observations, patient history, and epidemiological information.  Fact Sheet for Patients:   BoilerBrush.com.cy  Fact Sheet for Healthcare  Providers: https://pope.com/  This test is not yet approved or  cleared by the [place] FDA and has been authorized for detection and/or diagnosis of SARS-CoV-2 by FDA under an Emergency Use Authorization (EUA).  This EUA will remain in effect (meaning this test can be used) for the duration of the COVID-19 declaration under Section 564(b)(1) of the Act, 21 U.S.C. section 360bbb-3(b)(1), unless the authorization is terminated or revoked sooner.  Performed at [facility], [address] [place], [place] [address]      Discharge Instructions:   Discharge Instructions    Diet - low sodium heart healthy   Complete by: As directed    Discharge instructions   Complete by: As directed  Check INR tomorrow, 08/11/2020, and adjust Coumadin dose accordingly.   Discharge wound care:   Complete by: As directed    Keep sacral wound clean and dry and apply Mepilex border to sacral wound daily.  Apply compression wrap to bilateral legs.  Follow-up with wound care nurse.   Increase activity slowly   Complete by: As directed      Allergies as of 08/10/2020      Reactions   Sulfa Antibiotics Other (See Comments)   Reaction:  Unknown    Levofloxacin Nausea Only      Medication List    STOP taking these medications   cloNIDine 0.1 MG tablet Commonly known as: CATAPRES   doxazosin 8 MG tablet Commonly known as: CARDURA     TAKE these medications   Acetaminophen 500 MG capsule Take 2 capsules by mouth every 8 (eight) hours as needed.   allopurinol 300 MG tablet Commonly known as: ZYLOPRIM Take 300 mg by mouth daily.   amoxicillin 500 MG capsule Commonly known as: AMOXIL 4 CAPSULES PRIOR TO DENTAL PROCEDURES AS DIRECTED   busPIRone 5 MG tablet Commonly known as: BUSPAR Take 1 tablet (5 mg total) by mouth 2 (two) times daily.   Cetirizine HCl 10 MG Caps Take 10 mg by mouth daily.   DULoxetine 30 MG capsule Commonly known as:  CYMBALTA Take 1 capsule (30 mg total) by mouth daily.   finasteride 5 MG tablet Commonly known as: PROSCAR Take 5 mg by mouth daily.   isosorbide mononitrate 60 MG 24 hr tablet Commonly known as: IMDUR Take 1 tablet (60 mg total) by mouth daily. What changed: when to take this   metolazone 2.5 MG tablet Commonly known as: ZAROXOLYN Take 1 tablet (2.5 mg total) by mouth 2 (two) times a week.   metoprolol succinate 25 MG 24 hr tablet Commonly known as: TOPROL-XL Take 0.5 tablets (12.5 mg total) by mouth daily.   potassium chloride 10 MEQ tablet Commonly known as: KLOR-CON TAKE 3 TABLETS (30 MEQ) BY MOUTH DAILY. TAKE AN ADDITIONAL 2TABS ON THE DAYS YOU TAKE METOLAZONE   rOPINIRole 2 MG tablet Commonly known as: REQUIP TAKE 1 TABLET BY MOUTH 4  TIMES DAILY What changed: See the new instructions.   torsemide 20 MG tablet Commonly known as: Demadex Take 2 tablets (40 mg total) by mouth daily. Take additional 40 mg for 3 pounds weight gain .   traZODone 50 MG tablet Commonly known as: DESYREL Take 50 mg by mouth at bedtime.   warfarin 2.5 MG tablet Commonly known as: COUMADIN Take as directed. If you are unsure how to take this medication, talk to your nurse or doctor. Original instructions: Take 1 tablet (2.5 mg total) by mouth daily at 4 PM. Takes daily at 1700 Start taking on: August 11, 2020 What changed:   medication strength  how much to take  when to take this            Discharge Care Instructions  (From admission, onward)         Start     Ordered   08/10/20 0000  Discharge wound care:       Comments: Keep sacral wound clean and dry and apply Mepilex border to sacral wound daily.  Apply compression wrap to bilateral legs.  Follow-up with wound care nurse.   08/10/20 1055          Contact information for after-discharge care    Destination    [facility] Preferred  SNF .   Service: Skilled Nursing Contact information: [address] [place] [address] [phone]                   Time coordinating discharge: 35 minutes  Signed:  Lurene Shadow  [facility] 08/10/2020, 10:55 AM   Pager on [email or web address]. If 7PM-7AM, please contact night-coverage at [email or web address]

## 2020-08-10 NOTE — Plan of Care (Signed)
The patient has been discharged. IV has been removed. The patient has been discharged to SNF. Report given to [facility]. Discharge information has been provided.  Problem: Education: Goal: Knowledge of General Education information will improve Description: Including pain rating scale, medication(s)/side effects and non-pharmacologic comfort measures Outcome: Completed/Met   Problem: Health Behavior/Discharge Planning: Goal: Ability to manage health-related needs will improve Outcome: Completed/Met   Problem: Clinical Measurements: Goal: Ability to maintain clinical measurements within normal limits will improve Outcome: Completed/Met Goal: Will remain free from infection Outcome: Completed/Met Goal: Diagnostic test results will improve Outcome: Completed/Met Goal: Respiratory complications will improve Outcome: Completed/Met Goal: Cardiovascular complication will be avoided Outcome: Completed/Met   Problem: Activity: Goal: Risk for activity intolerance will decrease Outcome: Completed/Met   Problem: Nutrition: Goal: Adequate nutrition will be maintained Outcome: Completed/Met   Problem: Coping: Goal: Level of anxiety will decrease Outcome: Completed/Met   Problem: Elimination: Goal: Will not experience complications related to bowel motility Outcome: Completed/Met Goal: Will not experience complications related to urinary retention Outcome: Completed/Met   Problem: Pain Managment: Goal: General experience of comfort will improve Outcome: Completed/Met   Problem: Safety: Goal: Ability to remain free from injury will improve Outcome: Completed/Met   Problem: Skin Integrity: Goal: Risk for impaired skin integrity will decrease Outcome: Completed/Met   Problem: Acute Rehab PT Goals(only PT should resolve) Goal: Pt Will Go Supine/Side To Sit Outcome: Completed/Met Goal: Patient Will Transfer Sit To/From Stand Outcome: Completed/Met Goal: Pt Will Transfer Bed To  Chair/Chair To Bed Outcome: Completed/Met Goal: Pt Will Ambulate Outcome: Completed/Met Goal: Pt/caregiver will Perform Home Exercise Program Outcome: Completed/Met   Problem: Malnutrition  (NI-5.2) Goal: Food and/or nutrient delivery Description: Individualized approach for food/nutrient provision. Outcome: Completed/Met

## 2020-08-10 NOTE — Progress Notes (Signed)
ANTICOAGULATION CONSULT NOTE  Pharmacy Consult for Warfarin Indication: aortic stenosis s/p St Jude mechanical AVR (2002)  Patient Measurements: Height: 5\' 11"  (180.3 cm) Weight: 104.3 kg (230 lb) IBW/kg (Calculated) : 75.3  Heparin DW: 94.8 kg  Vital Signs: Temp: 97.5 F (36.4 C) (09/16 0610) Temp Source: Oral (09/16 0610) BP: 126/62 (09/16 0610) Pulse Rate: 102 (09/16 0610)  Labs: Recent Labs    08/08/20 0637 08/08/20 1406 08/08/20 2142 08/08/20 2142 08/09/20 0512 08/10/20 0420  HGB 8.4*   < > 8.9*   < > 8.4* 9.6*  HCT 27.5*   < > 29.0*  --  27.5* 31.0*  PLT 274  --   --   --  278 287  LABPROT 27.3*  --   --   --  27.1* 32.1*  INR 2.6*  --   --   --  2.6* 3.2*  CREATININE 0.97  --   --   --  1.03 1.04   < > = values in this interval not displayed.    Estimated Creatinine Clearance: 62.7 mL/min (by C-G formula based on SCr of 1.04 mg/dL).   Medical History: Past Medical History:  Diagnosis Date  . Anxiety 10/11  . Aortic stenosis    a. s/p mechcanical AVR, 2002; b. 10/2018 Echo: Triv AI, mean grad 11/2018.  . Bradycardia    chronic, no symptoms 07/2010  . C. difficile colitis   . Carotid bruit    dopplers in past, no abnormalities  . Chronic diastolic CHF (congestive heart failure) (HCC)    a. Echo 03/2015: EF 60-65%, no RWMA, GR1DD, mild BAE, mild to mod MR, mod TR, PASP 65 mmHg; b. 01/2017 Echo: EF 55-60%, NRWMA, grade 1 diastolic dysfunction.  Normal functioning prosthetic aortic valve.  Mean gradient 50 mmHg.  Sev TR. PASP 02/2017; c. 10/2018 Echo: EF 55-60%, Triv AI, mod dil LA, mod-sev TR, PASP 35-40, mild to mod red RV fxn.  . Coronary artery disease    a. mild, cath, 08/2010; b. medically managed  . Decreased hearing    Right ear  . Depression   . Gastric ulcer   . GERD (gastroesophageal reflux disease)   . Hypertension    BP higher than usual 04/19/10; amlodipine increased by telephone  . Mod-Sev Tricuspid regurgitation    a. 10/2018 Echo: Mod-Sev TR,  PASP 35-46mmHg.  43m RLS (restless legs syndrome) 08/23/2015  . S/P AVR    a. St. Jude. mechanical 2002; b. echo 08/2010 EF 60%, trival AI, mild MR, AVR working well; c. on longterm warfarin tx  . SOB (shortness of breath) 10/11   08/2010,Episodes at 5 AM, eventually felt to be anxiety, after complete workup including catheterization, pt greatly improved with anxiety meds 11/11    Medications:  PTA warfarin 5mg  daily  Assessment: Pharmacy has been consulted for warfarin dosing and monitoring in an 86yo male with a PMH significant for aortic stenosis s/p St. Jude mechanical AVR in 2002 (on Coumadin), chronic diastolic HF, HTN, CAD, chronic venous stasis. Upon admission patient Hgb 4, Hct 14, and INR >10; rectal exam revealed heme positive stools. Patient received 2 units pRBC, 10mg  IV vitamin K x1, Kcentra, and 1 unit FFP. H&H has improved since admission.    warfarin:  Date   INR   Dose 09/04   1.5   NONE 09/05   1.4   2.5 mg 09/06   1.5   2.5 mg 09/07   2.1   NONE  (2.5 mg vit K  po) 09/08   2.4   NONE 09/09   2.1   2.5 mg 09/10   2.5   1 mg   09/11   3.1   NONE 09/12   3.3   1.5 mg 09/13   2.9   1 mg 09/14   2.6   2.5 mg 09/15   2.6   2.5 mg  09/16  ` 3.2   1 mg  Goal of Therapy:  INR 2.5-3.5 (per Coumadin clinic) Monitor platelets by anticoagulation protocol: Yes   Plan:   warfarin 1 mg po x 1 based on inpatient dosing response  Repeat INR in am  Lowella Bandy, PharmD, BCPS Clinical Pharmacist 08/10/2020 6:58 AM

## 2020-08-10 NOTE — Care Management Important Message (Signed)
Important Message  Patient Details  Name: [name] MRN: [id number] Date of Birth: Oct 10, 1931   Medicare Important Message Given:  Yes     [name] 08/10/2020, 1:10 PM

## 2020-08-10 NOTE — TOC Transition Note (Signed)
Transition of Care [facility]) - CM/SW Discharge Note   Patient Details  Name: [name] MRN: [id number] Date of Birth: Jan 15, 1931  Transition of Care [facility]) CM/SW Contact:  Chapman Fitch, RN Phone Number: 08/10/2020, 2:37 PM   Clinical Narrative:     Patient to discharge to [facility] today DC information sent in the HUB.  [name] at [facility] aware.   MD has updated daughter EMS packet printed and on chart   Bedside RN to call report and EMS  Final next level of care: Skilled Nursing Facility Barriers to Discharge: No Barriers Identified   Patient Goals and CMS Choice        Discharge Placement                Patient to be transferred to facility by: EMS      Discharge Plan and Services   Discharge Planning Services: CM Consult                                 Social Determinants of Health (SDOH) Interventions     Readmission Risk Interventions Readmission Risk Prevention Plan 08/10/2020 08/04/2020 07/12/2020  Transportation Screening Complete Complete Complete  PCP or Specialist Appt within 3-5 Days Complete - Complete  HRI or Home Care Consult Complete (No Data) -  Social Work Consult for Recovery Care Planning/Counseling - Complete Complete  Palliative Care Screening Not Applicable Not Applicable Not Applicable  Medication Review Oceanographer) Complete Complete Complete  Some recent data might be hidden

## 2020-08-11 ENCOUNTER — Other Ambulatory Visit
Admission: RE | Admit: 2020-08-11 | Discharge: 2020-08-11 | Disposition: A | Discharge status: Home / Self Care | Payer: Medicare Other | Source: Ambulatory Visit | Attending: Internal Medicine | Admitting: Internal Medicine

## 2020-08-11 DIAGNOSIS — Z7901 Long term (current) use of anticoagulants: Secondary | ICD-10-CM | POA: Diagnosis present

## 2020-08-11 LAB — PROTIME-INR
INR: 2.7 — ABNORMAL HIGH (ref 0.8–1.2)
Prothrombin Time: 27.5 seconds — ABNORMAL HIGH (ref 11.4–15.2)

## 2020-08-14 ENCOUNTER — Telehealth: Payer: Self-pay

## 2020-08-14 ENCOUNTER — Other Ambulatory Visit
Admission: RE | Admit: 2020-08-14 | Discharge: 2020-08-14 | Disposition: A | Discharge status: Home / Self Care | Payer: Medicare Other | Source: Skilled Nursing Facility | Attending: Internal Medicine | Admitting: Internal Medicine

## 2020-08-14 DIAGNOSIS — I4891 Unspecified atrial fibrillation: Secondary | ICD-10-CM | POA: Insufficient documentation

## 2020-08-14 LAB — CBC
HCT: 30 % — ABNORMAL LOW (ref 39.0–52.0)
Hemoglobin: 9.4 g/dL — ABNORMAL LOW (ref 13.0–17.0)
MCH: 29 pg (ref 26.0–34.0)
MCHC: 31.3 g/dL (ref 30.0–36.0)
MCV: 92.6 fL (ref 80.0–100.0)
Platelets: 288 10*3/uL (ref 150–400)
RBC: 3.24 MIL/uL — ABNORMAL LOW (ref 4.22–5.81)
RDW: 17.9 % — ABNORMAL HIGH (ref 11.5–15.5)
WBC: 6.2 10*3/uL (ref 4.0–10.5)
nRBC: 0 % (ref 0.0–0.2)

## 2020-08-14 LAB — PROTIME-INR
INR: 2.4 — ABNORMAL HIGH (ref 0.8–1.2)
Prothrombin Time: 25.1 seconds — ABNORMAL HIGH (ref 11.4–15.2)

## 2020-08-14 NOTE — Telephone Encounter (Signed)
Spoke w/ [name].  Advised her that since pt is in SNF, Dr. Dareen Piano is monitoring his INR and the results are not being sent to me. She will be going by the facility later today and will see if they would be willing to route results to me for dosing.  Asked her to call back if I can be of further assistance.

## 2020-08-14 NOTE — Telephone Encounter (Signed)
-----   Message from [place], [place] sent at 08/10/2020  8:43 AM EDT ----- Regarding: Cory Roughen wants you to call her and let her know if his ltc facility is doing his inr and sending to you

## 2020-08-17 ENCOUNTER — Ambulatory Visit: Payer: Medicare Other | Admitting: Physician Assistant

## 2020-08-17 LAB — H. PYLORI ANTIGEN, STOOL: H. Pylori Stool Ag, Eia: NEGATIVE

## 2020-08-18 ENCOUNTER — Other Ambulatory Visit: Payer: Self-pay

## 2020-08-18 ENCOUNTER — Other Ambulatory Visit
Admission: RE | Admit: 2020-08-18 | Discharge: 2020-08-18 | Disposition: A | Discharge status: Home / Self Care | Payer: Medicare Other | Source: Ambulatory Visit | Attending: Internal Medicine | Admitting: Internal Medicine

## 2020-08-18 ENCOUNTER — Non-Acute Institutional Stay: Payer: Medicare Other | Admitting: Adult Health Nurse Practitioner

## 2020-08-18 DIAGNOSIS — Z515 Encounter for palliative care: Secondary | ICD-10-CM

## 2020-08-18 DIAGNOSIS — R578 Other shock: Secondary | ICD-10-CM

## 2020-08-18 DIAGNOSIS — Z7901 Long term (current) use of anticoagulants: Secondary | ICD-10-CM | POA: Diagnosis present

## 2020-08-18 LAB — PROTIME-INR
INR: 2.4 — ABNORMAL HIGH (ref 0.8–1.2)
Prothrombin Time: 25.5 seconds — ABNORMAL HIGH (ref 11.4–15.2)

## 2020-08-19 NOTE — Progress Notes (Addendum)
Therapist, nutritional Palliative Care Consult Note Telephone: [phone]  Fax: [phone]  PATIENT NAME: [name] DOB: [birth year] MRN: [id number]  PRIMARY CARE PROVIDER:   Gracelyn Nurse, MD  REFERRING PROVIDER:  Dr. Dareen Piano  RESPONSIBLE PARTY:   Self Daughter, Prudencio Burly [phone]       RECOMMENDATIONS and PLAN:  1.  Advanced care planning.  Patient is DNR.  Patient was full code when he was discharged from hospital.  He states today that he has had a DNR filled out at the facility.  Will call daughter to update on visit.  Update (08/22/20):  Per staff and daughter patient is not a DNR and is still currently a full code.    2.  Functional status.  Prior to hospitalization patient was driving and taking care of himself.  He states that he can walk with walker and perform ADLs with minimal assistance. States that while at the facility he has to have assistance with walking and with going to the bathroom.  He is continent of [place].   Denies falls.  States that he feels like he is getting stronger. Continue supportive care at the facility.  Continue PT/OT as ordered  3.  Nutritional status.  Patient has had significant weight loss with this hospitalization.  Was 230 pounds and today weighs 183.6 with BMI of 25.6.  Patient does report fair appetite.  Staff reports that he eats about 50% of his meals.  Does supplement with Ensure.  Continue supportive care at facility and monitoring for weight loss.   4. Pain.  Patient has pain in bilateral knees due to arthritis.  States that he has been told that it is "bone on bone" in his knees.  States that he uses CBD cream on his knees with good pain relief.  Continue current pain regimen  Patient states that he is looking forward to going back home and has someone who comes into his home 3-4 hours per day, 5 days a week to help with things like shopping, laundry, etc.  States that he has a care plan meeting  next week and would be interested in palliative services once discharged back home.  Palliative care will continue to monitor for symptom management and make recommendations as needed.  Patient may be discharged before another visit can be done at facility.  Will pursue new referral upon discharge.  I spent 35 minutes providing this consultation,  from 10:30 to 11:05 including time spent with patient/family, chart review, provider coordination, documentation. More than 50% of the time in this consultation was spent coordinating communication.   HISTORY OF PRESENT ILLNESS:  [name] is a 86 y.o. year old male with multiple medical problems including aortic stenosis w/valve replacement (on coumadin), CHF, HTN, CAD, venous stasis. Palliative Care was asked to help address goals of care. Patient hospitalized 9/4-9/16/21 for acute blood loss anemia believed to be due to diverticular bleeding.  He did require 2 units FFP.  Prior to hospitalization patient a resident in [place] apartment at [facility].  He is now at the SNF at [facility] for rehab.   Does state that he gets dizzy sometimes with position changes; instructed to move slowly with position changes. Denies fever, headache, SOB, cough, N/V/D, constipation, dysuria, hematuria, melena, hematochezia.        CODE STATUS: Full Code  PPS: 40% HOSPICE ELIGIBILITY/DIAGNOSIS: TBD  PHYSICAL EXAM:  BP 122/60 HR 93 O2 100% on RA General: NAD, frail appearing Cardiovascular:  regular rate and rhythm Pulmonary: lung sounds clear; normal respiratory effort Abdomen: soft, nontender, + bowel sounds GU: no suprapubic tenderness Extremities: no edema, no joint deformities; has wound to left lower leg, did not take off bandage for exam (being followed at wound clinic) Skin: no rashes on exposed skin Neurological: Weakness but otherwise nonfocal   PAST MEDICAL HISTORY:  Past Medical History:  Diagnosis Date  . Anxiety 10/11  . Aortic stenosis    a. s/p  mechcanical AVR, 2002; b. 10/2018 Echo: Triv AI, mean grad .  . Bradycardia    chronic, no symptoms 07/2010  . C. difficile colitis   . Carotid bruit    dopplers in past, no abnormalities  . Chronic diastolic CHF (congestive heart failure) (HCC)    a. Echo 03/2015: EF 60-65%, no RWMA, GR1DD, mild BAE, mild to mod MR, mod TR, PASP 65 mmHg; b. 01/2017 Echo: EF 55-60%, NRWMA, grade 1 diastolic dysfunction.  Normal functioning prosthetic aortic valve.  Mean gradient 50 mmHg.  Sev TR. PASP ; c. 10/2018 Echo: EF 55-60%, Triv AI, mod dil LA, mod-sev TR, PASP 35-40, mild to mod red RV fxn.  . Coronary artery disease    a. mild, cath, 08/2010; b. medically managed  . Decreased hearing    Right ear  . Depression   . Gastric ulcer   . GERD (gastroesophageal reflux disease)   . Hypertension    BP higher than usual 04/19/10; amlodipine increased by telephone  . Mod-Sev Tricuspid regurgitation    a. 10/2018 Echo: Mod-Sev TR, PASP 35-67mmHg.  Marland Kitchen RLS (restless legs syndrome) 08/23/2015  . S/P AVR    a. St. Jude. mechanical 2002; b. echo 08/2010 EF 60%, trival AI, mild MR, AVR working well; c. on longterm warfarin tx  . SOB (shortness of breath) 10/11   08/2010,Episodes at 5 AM, eventually felt to be anxiety, after complete workup including catheterization, pt greatly improved with anxiety meds 11/11    SOCIAL HX:  Social History   Tobacco Use  . Smoking status: Former Smoker    Types: Cigarettes    Quit date: 1979    Years since quitting: 42.7  . Smokeless tobacco: Never Used  Substance Use Topics  . Alcohol use: Yes    Alcohol/week: 3.0 standard drinks    Types: 3 Glasses of wine per week    ALLERGIES:  Allergies  Allergen Reactions  . Sulfa Antibiotics Other (See Comments)    Reaction:  Unknown   . Levofloxacin Nausea Only     PERTINENT MEDICATIONS:  Outpatient Encounter Medications as of 08/18/2020  Medication Sig  . Acetaminophen 500 MG capsule Take 2 capsules by mouth every  8 (eight) hours as needed.  Marland Kitchen allopurinol (ZYLOPRIM) 300 MG tablet Take 300 mg by mouth daily.   Marland Kitchen amoxicillin (AMOXIL) 500 MG capsule 4 CAPSULES PRIOR TO DENTAL PROCEDURES AS DIRECTED  . busPIRone (BUSPAR) 5 MG tablet Take 1 tablet (5 mg total) by mouth 2 (two) times daily.  . Cetirizine HCl 10 MG CAPS Take 10 mg by mouth daily.  . DULoxetine (CYMBALTA) 30 MG capsule Take 1 capsule (30 mg total) by mouth daily.  . finasteride (PROSCAR) 5 MG tablet Take 5 mg by mouth daily.    . isosorbide mononitrate (IMDUR) 60 MG 24 hr tablet Take 1 tablet (60 mg total) by mouth daily.  . metoprolol succinate (TOPROL-XL) 25 MG 24 hr tablet Take 0.5 tablets (12.5 mg total) by mouth daily.  . potassium chloride (KLOR-CON) 10  MEQ tablet TAKE 3 TABLETS (30 MEQ) BY MOUTH DAILY. TAKE AN ADDITIONAL 2TABS ON THE DAYS YOU TAKE METOLAZONE  . rOPINIRole (REQUIP) 2 MG tablet TAKE 1 TABLET BY MOUTH 4  TIMES DAILY (Patient taking differently: Take 2 mg by mouth in the morning, at noon, in the evening, and at bedtime. Take 1 tablet by mouth 4  times daily)  . torsemide (DEMADEX) 20 MG tablet Take 2 tablets (40 mg total) by mouth daily. Take additional 40 mg for 3 pounds weight gain .  . traZODone (DESYREL) 50 MG tablet Take 50 mg by mouth at bedtime.  Marland Kitchen warfarin (COUMADIN) 2.5 MG tablet Take 1 tablet (2.5 mg total) by mouth daily at 4 PM. Takes daily at 1700   No facility-administered encounter medications on file as of 08/18/2020.     Sargon Scouten Marlena Clipper, NP

## 2020-08-21 ENCOUNTER — Ambulatory Visit: Payer: Medicare Other | Admitting: Family

## 2020-08-21 ENCOUNTER — Ambulatory Visit (INDEPENDENT_AMBULATORY_CARE_PROVIDER_SITE_OTHER): Payer: Medicare Other

## 2020-08-21 ENCOUNTER — Encounter: Payer: Self-pay | Admitting: Family

## 2020-08-21 ENCOUNTER — Other Ambulatory Visit: Payer: Self-pay

## 2020-08-21 VITALS — BP 125/70 | Ht 71.0 in | Wt 181.0 lb

## 2020-08-21 DIAGNOSIS — Z952 Presence of prosthetic heart valve: Secondary | ICD-10-CM

## 2020-08-21 DIAGNOSIS — D649 Anemia, unspecified: Secondary | ICD-10-CM

## 2020-08-21 DIAGNOSIS — I350 Nonrheumatic aortic (valve) stenosis: Secondary | ICD-10-CM

## 2020-08-21 DIAGNOSIS — Z7901 Long term (current) use of anticoagulants: Secondary | ICD-10-CM

## 2020-08-21 DIAGNOSIS — I5032 Chronic diastolic (congestive) heart failure: Secondary | ICD-10-CM

## 2020-08-21 DIAGNOSIS — I4821 Permanent atrial fibrillation: Secondary | ICD-10-CM

## 2020-08-21 LAB — POCT INR: INR: 1.9 — AB (ref 2.0–3.0)

## 2020-08-21 NOTE — Patient Instructions (Addendum)
--   spoke w/ Babs Sciara, RN @ [facility] and she clarified that pt is taking 2 mg warfarin every day  - advised her to have pt take 5 mg warfarin today & tomorrow, then resume dosage of 2 mg every day - recheck as scheduled on 08/28/20 - may need to increase dosage at that visit

## 2020-08-21 NOTE — Patient Instructions (Signed)
Medication Instructions:   Your Coumadin has been changed to 5 mg on Monday 9/27 and 5 mg on 9/28 and then return to half tablet (2.5 mg) daily.  Repeat Coumadin clinic visit is scheduled for 08/28/2020.  Continue torsemide 40 mg daily with additional 40 mg as needed for 3 pound weight gain.  *If you need a refill on your cardiac medications before your next appointment, please call your pharmacy*   Lab Work: Your provider recommends that you return for lab work today: BNP, BMP, CBC  If you have labs (blood work) drawn today and your tests are completely normal, you will receive your results only by: Marland Kitchen MyChart Message (if you have MyChart) OR . A paper copy in the mail If you have any lab test that is abnormal or we need to change your treatment, we will call you to review the results.  Testing/Procedures: None ordered today.   Follow-Up: At [facility], you and your health needs are our priority.  As part of our continuing mission to provide you with exceptional heart care, we have created designated Provider Care Teams.  These Care Teams include your primary Cardiologist (physician) and Advanced Practice Providers (APPs -  Physician Assistants and Nurse Practitioners) who all work together to provide you with the care you need, when you need it.  We recommend signing up for the patient portal called "MyChart".  Sign up information is provided on this After Visit Summary.  MyChart is used to connect with patients for Virtual Visits (Telemedicine).  Patients are able to view lab/test results, encounter notes, upcoming appointments, etc.  Non-urgent messages can be sent to your provider as well.   To learn more about what you can do with MyChart, go to [email or web address].    Your next appointment:   3 week(s)  The format for your next appointment:   In Person  Provider:   You may see Julien Nordmann, MD or one of the following Advanced Practice Providers on your designated Care  Team:    Nicolasa Ducking, NP  Eula Listen, PA-C  [name], PA-C    Gillian Shields, NP  Other Instructions Continue to weigh daily and report weight gain 2 pounds overnight or 5 pounds in 1 week.  Continue to elevate lower extremities when sitting.  Report new or worsening swelling, shortness of breath, chest pain.  Continue low-sodium, heartily diet.  Monitor feet daily for signs and symptoms of swelling and/or new infection.

## 2020-08-21 NOTE — Progress Notes (Signed)
Office Visit    Patient Name: [name] Date of Encounter: 08/21/2020  Primary Care Provider:  Gracelyn NurseJohnston, [name], MD Primary Cardiologist:  Julien Nordmannimothy Gollan, MD Electrophysiologist:  None   Chief Complaint    [name] is a 86 y.o. male with a hx of permanent atrial fibrillation, aortic stenosis s/p mechanical AVR on chronic Coumadin, chronic diastolic heart failure, nonobstructive coronary disease, HTN, HLD, GERD, chronic dyspnea, chronic venous stasis presents today for follow up of HFrEF.   Past Medical History    Past Medical History:  Diagnosis Date  . Anxiety 10/11  . Aortic stenosis    a. s/p mechcanical AVR, 2002; b. 10/2018 Echo: Triv AI, mean grad 9mmHg.  . Bradycardia    chronic, no symptoms 07/2010  . C. difficile colitis   . Carotid bruit    dopplers in past, no abnormalities  . Chronic diastolic CHF (congestive heart failure) (HCC)    a. Echo 03/2015: EF 60-65%, no RWMA, GR1DD, mild BAE, mild to mod MR, mod TR, PASP 65 mmHg; b. 01/2017 Echo: EF 55-60%, NRWMA, grade 1 diastolic dysfunction.  Normal functioning prosthetic aortic valve.  Mean gradient 50 mmHg.  Sev TR. PASP 54mmHg; c. 10/2018 Echo: EF 55-60%, Triv AI, mod dil LA, mod-sev TR, PASP 35-40, mild to mod red RV fxn.  . Coronary artery disease    a. mild, cath, 08/2010; b. medically managed  . Decreased hearing    Right ear  . Depression   . Gastric ulcer   . GERD (gastroesophageal reflux disease)   . Hypertension    BP higher than usual 04/19/10; amlodipine increased by telephone  . Mod-Sev Tricuspid regurgitation    a. 10/2018 Echo: Mod-Sev TR, PASP 35-5440mmHg.  Marland Kitchen. RLS (restless legs syndrome) 08/23/2015  . S/P AVR    a. [employer]. mechanical 2002; b. echo 08/2010 EF 60%, trival AI, mild MR, AVR working well; c. on longterm warfarin tx  . SOB (shortness of breath) 10/11   08/2010,Episodes at 5 AM, eventually felt to be anxiety, after complete workup including catheterization, pt greatly  improved with anxiety meds 11/11   Past Surgical History:  Procedure Laterality Date  . CARDIAC CATHETERIZATION    . COLONOSCOPY WITH PROPOFOL N/A 08/03/2020   Procedure: COLONOSCOPY WITH PROPOFOL;  Surgeon: Toledo, Boykin Nearingeodoro K, MD;  Location: [facility] ENDOSCOPY;  Service: Gastroenterology;  Laterality: N/A;  . ESOPHAGOGASTRODUODENOSCOPY N/A 04/05/2015   Procedure: ESOPHAGOGASTRODUODENOSCOPY (EGD);  Surgeon: [name], MD;  Location: [facility]COPY;  Service: Endoscopy;  Laterality: N/A;  . ESOPHAGOGASTRODUODENOSCOPY N/A 04/17/2015   Procedure: ESOPHAGOGASTRODUODENOSCOPY (EGD);  Surgeon: [name], MD;  Location[facility]COPY;  Service: Endoscopy;  Laterality: N/A;  . ESOPHAGOGASTRODUODENOSCOPY N/A 08/02/2015   Procedure: ESOPHAGOGASTRODUODENOSCOPY (EGD);  Surgeon: [name], MD;  Location: [facility] END[facility]COPY;  Service: Endoscopy;  Laterality: N/A;  . ESOPHAGOGASTRODUODENOSCOPY N/A 07/30/2020   Procedure: ESOPHAGOGASTRODUODENOSCOPY (EGD);  Surgeon: Toledo, Boykin Nearingeodoro K, MD;  Location: [facility] ENDOSCOPY;  Service: Gastroenterology;  Laterality: N/A;  . HERNIA REPAIR    . JOINT REPLACEMENT    . TOTAL HIP ARTHROPLASTY    . VALVE REPLACEMENT  1/02   Aortic; echo 3/09 valve working well; echo 10/11 working well; put on Coumadin    Allergies  Allergies  Allergen Reactions  . Sulfa Antibiotics Other (See Comments)    Reaction:  Unknown   . Levofloxacin Nausea Only    History of Present Illness    [name] is a 86 y.o. male  with a hx of  permanent atrial fibrillation, aortic stenosis s/p mechanical AVR on chronic Coumadin, chronic diastolic heart failure, nonobstructive coronary disease, HTN, HLD, GERD, chronic dyspnea, chronic venous stasis  last seen while hosptialized.  Followed closely for diastolic heart failure. 07/2019 Torsemide adjusted to 40mg  BID and Metolazone increased to 3x/week. 09/2019 weight down to 206 lb. 12/2019 he was utilizing Metolazone only once per week with  weight 195 lb on home scale. He was recommended to utilize Metolazone 2x/week. Seen 01/2020 with stable weight and diuretic regimen continued.   July 2021 noted weight up to 205lbs and weeping to lower extremities. He was instructed to take Metolazone 3 days in a row with Torsemide 40mg  BID. Repeat BMP with elevated creatinine and diuretics held for 2 days. Seen in follow up 06/23/20 with continued LE edema and abdominal distention. Evaluated in INR for supratherapeutic INR but volume not addressed. CXR 06/26/20 mild central pulmonary vascular congestion with bibasilar pulmonary edema. Seen in clinic 07/03/20 and sent to short stay for 40mg  IV Lasix.   Admitted 07/10/2020 -07/14/2020 after being seen by wound care team and recommended to go to the ED for worsening erythema suspicious for cellulitis.  He was treated with IV vancomycin with resolution of cellulitis.  He was admitted to the hospital 07/29/2020-08/10/20 after presenting with lethargy, shortness of breath, hypoxia.  Work-up relieved hemoglobin 4, INR greater than 10, rectal exam with positive heme stools.  Treated with IV vitamin K, Kcentra, 2 units of FFP as well as IV Protonix.  EGD 07/30/2020 with large hiatal hernia but no evidence of bleeding.  Colonoscopy 08/03/2020 with nonbleeding internal hemorrhoids, diverticulosis in sigmoid colon descending colon, stool in entire examined colon and blood in sigmoid colon.  Echo 08/06/2020 EF 60 to 65%, no  RWMA, RV mildly enlarged, mildly elevated PASP, mild MR, moderate TR, mechanical aortic valve with mean gradient 9 mmHg.  EEG due to acute metabolic encephalopathy 08/07/2020 suggestive of mild diffuse encephalopathy, no seizures.  He was treated with IV Lasix for acute on chronic diastolic heart failure.  His clonidine and doxazosin were discontinued.  He was discharged to SNF.  Present today with his daughter for follow up. He has been in [facility]. He normally resides in the [facility]. Hopeful to return  to his apartment, but recognizes need for increased therapy and strengthening. Reports no lower extremity edema. Endorses his dyspnea has been well controlled. Enjoying working with PT/OT. Reports no lightheadedness, dizziness, near syncope. He has been weighed daily at facility and weight continues to trend down. Endorses eating two good meals per day.   EKGs/Labs/Other Studies Reviewed:   The following studies were reviewed today: Echo 08/06/20 1. Left ventricular ejection fraction, by estimation, is 60 to 65%. The  left ventricle has normal function. The left ventricle has no regional  wall motion abnormalities. Left ventricular diastolic parameters are  indeterminate.   2. Right ventricular systolic function is normal. The right ventricular  size is mildly enlarged. There is mildly elevated pulmonary artery  systolic pressure. The estimated right ventricular systolic pressure is  39.2 mmHg.   3. Mild mitral valve regurgitation.   4. Tricuspid valve regurgitation is moderate.   5. The aortic valve was not well visualized. Mechanical aortic valve. No  aortic stenosis is present. Aortic valve mean gradient measures 9.0 mmHg.   6. Challenging images   EKG:  No EKG today.   Recent Labs: 08/05/2020: ALT 21 08/07/2020: B Natriuretic Peptide 265.7 08/10/2020: BUN  17; Creatinine, Ser 1.04; Magnesium 2.3; Potassium 4.1; Sodium 143 08/14/2020: Hemoglobin 9.4; Platelets 288  Recent Lipid Panel No results found for: CHOL, TRIG, HDL, CHOLHDL, VLDL, LDLCALC, LDLDIRECT  Home Medications   Current Meds  Medication Sig  . Acetaminophen 500 MG capsule Take 2 capsules by mouth every 8 (eight) hours as needed.  Marland Kitchen allopurinol (ZYLOPRIM) 300 MG tablet Take 300 mg by mouth daily.   Marland Kitchen amoxicillin (AMOXIL) 500 MG capsule 4 CAPSULES PRIOR TO DENTAL PROCEDURES AS DIRECTED  . busPIRone (BUSPAR) 5 MG tablet Take 1 tablet (5 mg total) by mouth 2 (two) times daily.  . Cetirizine HCl 10 MG CAPS Take 10 mg by  mouth daily.  . DULoxetine (CYMBALTA) 30 MG capsule Take 1 capsule (30 mg total) by mouth daily.  . finasteride (PROSCAR) 5 MG tablet Take 5 mg by mouth daily.    . isosorbide mononitrate (IMDUR) 60 MG 24 hr tablet Take 1 tablet (60 mg total) by mouth daily.  . metoprolol succinate (TOPROL-XL) 25 MG 24 hr tablet Take 0.5 tablets (12.5 mg total) by mouth daily.  . pantoprazole (PROTONIX) 40 MG tablet Take 40 mg by mouth daily.  . potassium chloride (KLOR-CON) 10 MEQ tablet TAKE 3 TABLETS (30 MEQ) BY MOUTH DAILY. TAKE AN ADDITIONAL 2TABS ON THE DAYS YOU TAKE METOLAZONE  . rOPINIRole (REQUIP) 2 MG tablet TAKE 1 TABLET BY MOUTH 4  TIMES DAILY (Patient taking differently: Take 2 mg by mouth in the morning, at noon, in the evening, and at bedtime. Take 1 tablet by mouth 4  times daily)  . torsemide (DEMADEX) 20 MG tablet Take 2 tablets (40 mg total) by mouth daily. Take additional 40 mg for 3 pounds weight gain .  . traZODone (DESYREL) 50 MG tablet Take 50 mg by mouth at bedtime.  Marland Kitchen warfarin (COUMADIN) 2.5 MG tablet Take 1 tablet (2.5 mg total) by mouth daily at 4 PM. Takes daily at 1700    Review of Systems      Review of Systems  Constitutional: Negative for chills, fever and malaise/fatigue.  Cardiovascular: Positive for dyspnea on exertion. Negative for chest pain, irregular heartbeat, leg swelling, near-syncope, orthopnea, palpitations and syncope.  Respiratory: Negative for cough, shortness of breath and wheezing.   Gastrointestinal: Negative for melena, nausea and vomiting.  Genitourinary: Negative for hematuria.  Neurological: Negative for dizziness, light-headedness and weakness.   All other systems reviewed and are otherwise negative except as noted above.  Physical Exam    VS:  BP 125/70 (BP Location: Left Arm, Patient Position: Sitting, Cuff Size: Normal)   Ht 5\' 11"  (1.803 m)   Wt 181 lb (82.1 kg)   BMI 25.24 kg/m  , BMI Body mass index is 25.24 kg/m. GEN: Well nourished, well  developed, in no acute distress. HEENT: normal. Neck: Supple, no JVD, carotid bruits, or masses. Cardiac: RRR, no murmurs, rubs, or gallops. No clubbing, cyanosis, edema.  Radials/DP/PT 2+ and equal bilaterally.  Respiratory:  Respirations regular and unlabored, clear to auscultation bilaterally. GI: Soft, nontender, nondistended, BS + x 4. MS: No deformity or atrophy. Skin: Warm and dry, no rash. Small red blister to bottom of right 3rd toe and left 2nd toe. No signs of infection. Bilateral LE wrapped in ACE bandage. Heels without signs of pressure injury.  Neuro:  Strength and sensation are intact. Psych: Normal affect.   Assessment & Plan    1. Chronic diastolic heart failure - Recent exacerbation in setting of anemia. Echo 08/06/20 EF  60-65%. Euvolemic and well compensated on exam. Continue Torsemide 40mg  daily and Metolazone 2.5mg  twice per week on Monday and Friday. Weight continues to downtrend. Weight today 181 lbs (07/03/20 in our clinic weight 224lbs). No edema on exam. BMP, BNP today. Consider reduced dose of diuretic pending lab results. Encouraged to utilize Ensure to boost nutritional status, we discussed the implications anemia as well as low protein could have on heart failure. GDMT includes Toprol, Torsemide, Metolazone.  2. CKDIII - Careful titration of diuretics and antihypertensives. BMP today.   3. Anemia - Recent admission with Hb 4 on admission. EGD and colonoscopy performed. Denies melena, hematuria. Recent Hb 08/14/20 Hb 9.4. CBC today.   4. HTN - Doxazosin and Clonidine discontinued during recent hospitalization. BP well controlled. Continue current antihypertensive regimen.   5. Nonobstructive CAD - Stable with no anginal symptoms. No indication for ischemic evaluation at this time. Continue GDMT includes beta-blocker, Imdur. No aspirin secondary to chronic anticoagulation.   6. Permanent atrial fibrillation/chronic anticoagulation - INR 1.9 today. Coumadin adjusted by  Coumadin Clinic RN. Follow up 08/28/20. Prefers for Coumadin clinic to manage his dosing rather than SNF. Denies bleeding complications. CBC today.   7. S/p AVR - Continue SBE prophylaxis. Continue warfarin, as above.   Disposition: Follow up in 3 week(s) with Dr. 10/28/20 or APP.    Mariah Milling, NP 08/21/2020, 6:49 PM

## 2020-08-22 ENCOUNTER — Telehealth: Payer: Self-pay | Admitting: Adult Health Nurse Practitioner

## 2020-08-22 LAB — BASIC METABOLIC PANEL
BUN/Creatinine Ratio: 15 (ref 10–24)
BUN: 15 mg/dL (ref 8–27)
CO2: 27 mmol/L (ref 20–29)
Calcium: 8.8 mg/dL (ref 8.6–10.2)
Chloride: 89 mmol/L — ABNORMAL LOW (ref 96–106)
Creatinine, Ser: 0.98 mg/dL (ref 0.76–1.27)
GFR calc Af Amer: 80 mL/min/{1.73_m2} (ref >59)
GFR calc non Af Amer: 70 mL/min/{1.73_m2} (ref >59)
Glucose: 94 mg/dL (ref 65–99)
Potassium: 4.1 mmol/L (ref 3.5–5.2)
Sodium: 133 mmol/L — ABNORMAL LOW (ref 134–144)

## 2020-08-22 LAB — CBC
Hematocrit: 32.5 % — ABNORMAL LOW (ref 37.5–51.0)
Hemoglobin: 10.6 g/dL — ABNORMAL LOW (ref 13.0–17.7)
MCH: 29 pg (ref 26.6–33.0)
MCHC: 32.6 g/dL (ref 31.5–35.7)
MCV: 89 fL (ref 79–97)
Platelets: 303 10*3/uL (ref 150–450)
RBC: 3.65 x10E6/uL — ABNORMAL LOW (ref 4.14–5.80)
RDW: 16.1 % — ABNORMAL HIGH (ref 11.6–15.4)
WBC: 7.4 10*3/uL (ref 3.4–10.8)

## 2020-08-22 LAB — BRAIN NATRIURETIC PEPTIDE: BNP: 202.5 pg/mL — ABNORMAL HIGH (ref 0.0–100.0)

## 2020-08-22 NOTE — Telephone Encounter (Signed)
Spoke with daughter to update on visit.  Patient had told me that he had filled out a DNR at facility, but staff and daughter state that he has not.  He is still a full code.  She states that he is getting stronger but staff do have concerns about him returning back to his apartment without 24/7 care.  After insurance is up the plan is to use his respite days to further therapy at SNF and to see how much he will be able to do on his own once he does go back.  Encouraged daughter to have conversation with her dad about ACP and will meet together at [employer] on 09/06/20 to go over further. Gave daughter my contact info and encouraged to call with any questions. Nissan Frazzini K. Garner Nash NP

## 2020-08-23 ENCOUNTER — Encounter: Payer: Self-pay | Admitting: Family

## 2020-08-23 NOTE — Progress Notes (Signed)
Note faxed to [facility] to relay recommendations regarding recent lab work.   Recommendations as below:   As BNP is still elevated though improved compared to hospital discharge, recommend continuing Torsemide 40mg  daily. Because of your mildly low sodium, it is VERY important to have a fluid intake of <2L per day. We recommend the SNF monitor your fluid intake. This will help prevent your sodium from lowering further. Recommend weighing daily while at SNF and calling our office for weight gain of 2lbs overnight or 5 lbs in one week.  , NP

## 2020-08-28 ENCOUNTER — Other Ambulatory Visit: Payer: Self-pay

## 2020-08-28 ENCOUNTER — Ambulatory Visit (INDEPENDENT_AMBULATORY_CARE_PROVIDER_SITE_OTHER): Payer: Medicare Other

## 2020-08-28 DIAGNOSIS — I060 Rheumatic aortic stenosis: Secondary | ICD-10-CM

## 2020-08-28 DIAGNOSIS — Z7901 Long term (current) use of anticoagulants: Secondary | ICD-10-CM | POA: Diagnosis not present

## 2020-08-28 DIAGNOSIS — Z5181 Encounter for therapeutic drug level monitoring: Secondary | ICD-10-CM | POA: Diagnosis not present

## 2020-08-28 LAB — POCT INR: INR: 1.3 — AB (ref 2.0–3.0)

## 2020-08-28 NOTE — Patient Instructions (Signed)
-   Resume previous warfarin dosage of 5 mg (1 tablet) every day EXCEPT 2.5 mg (1/2 tablet) on MONDAYS, WEDNESDAYS & FRIDAYS. - Recheck INR in 1 week.

## 2020-09-01 ENCOUNTER — Other Ambulatory Visit
Admission: RE | Admit: 2020-09-01 | Discharge: 2020-09-01 | Disposition: A | Discharge status: Home / Self Care | Payer: Medicare Other | Source: Skilled Nursing Facility | Attending: Internal Medicine | Admitting: Internal Medicine

## 2020-09-01 DIAGNOSIS — Z029 Encounter for administrative examinations, unspecified: Secondary | ICD-10-CM | POA: Insufficient documentation

## 2020-09-01 LAB — BASIC METABOLIC PANEL
Anion gap: 10 (ref 5–15)
BUN: 28 mg/dL — ABNORMAL HIGH (ref 8–23)
CO2: 31 mmol/L (ref 22–32)
Calcium: 8.4 mg/dL — ABNORMAL LOW (ref 8.9–10.3)
Chloride: 90 mmol/L — ABNORMAL LOW (ref 98–111)
Creatinine, Ser: 1.12 mg/dL (ref 0.61–1.24)
GFR calc non Af Amer: 59 mL/min — ABNORMAL LOW (ref >60)
Glucose, Bld: 80 mg/dL (ref 70–99)
Potassium: 2.8 mmol/L — ABNORMAL LOW (ref 3.5–5.1)
Sodium: 131 mmol/L — ABNORMAL LOW (ref 135–145)

## 2020-09-04 ENCOUNTER — Encounter
Admission: RE | Admit: 2020-09-04 | Discharge: 2020-09-04 | Disposition: A | Discharge status: Home / Self Care | Payer: Medicare Other | Source: Ambulatory Visit | Attending: Internal Medicine | Admitting: Internal Medicine

## 2020-09-04 ENCOUNTER — Ambulatory Visit (INDEPENDENT_AMBULATORY_CARE_PROVIDER_SITE_OTHER): Payer: Medicare Other

## 2020-09-04 ENCOUNTER — Other Ambulatory Visit: Payer: Self-pay

## 2020-09-04 DIAGNOSIS — Z7901 Long term (current) use of anticoagulants: Secondary | ICD-10-CM

## 2020-09-04 DIAGNOSIS — I350 Nonrheumatic aortic (valve) stenosis: Secondary | ICD-10-CM | POA: Diagnosis not present

## 2020-09-04 DIAGNOSIS — Z952 Presence of prosthetic heart valve: Secondary | ICD-10-CM | POA: Diagnosis not present

## 2020-09-04 LAB — POCT INR: INR: 1.5 — AB (ref 2.0–3.0)

## 2020-09-04 NOTE — Patient Instructions (Signed)
-   take 1 whole tablet today - tomorrow, START NEW DOSAGE of warfarin of 5 mg (1 tablet) every day EXCEPT 2.5 mg (1/2 tablet) on MONDAYS, WEDNESDAYS & FRIDAYS. - Recheck INR in 10 days.

## 2020-09-05 ENCOUNTER — Telehealth: Payer: Self-pay | Admitting: Adult Health Nurse Practitioner

## 2020-09-05 NOTE — Telephone Encounter (Signed)
Returned daughter's VM that she had to cancel tomorrow's visit to take her dad to wound clinic.  Called to reschedule.  Left VM with reason for call and call back info. Gethsemane Fischler K. Garner Nash NP

## 2020-09-06 ENCOUNTER — Encounter: Payer: Medicare Other | Attending: Internal Medicine | Admitting: Internal Medicine

## 2020-09-06 ENCOUNTER — Other Ambulatory Visit: Payer: Self-pay

## 2020-09-06 DIAGNOSIS — I890 Lymphedema, not elsewhere classified: Secondary | ICD-10-CM | POA: Diagnosis not present

## 2020-09-06 DIAGNOSIS — I87323 Chronic venous hypertension (idiopathic) with inflammation of bilateral lower extremity: Secondary | ICD-10-CM | POA: Insufficient documentation

## 2020-09-06 NOTE — Progress Notes (Signed)
[name], [name] ([id number]) Visit Report for 09/06/2020 Abuse/Suicide Risk Screen Details Patient Name: [name], [name] Date of Service: 09/06/2020 9:45 AM Medical Record Number: [id number] Patient Account Number: [id number] Date of Birth/Sex: [birth year] (86 y.o. M) Treating RN: Rodell Perna Primary Care Marrie Chandra: [name] Other Clinician: Referring Dalis Beers: Referral, Self Treating Luetta Piazza/Extender: Altamese [place] in Treatment: 0 Abuse/Suicide Risk Screen Items Answer ABUSE RISK SCREEN: Has anyone close to you tried to hurt or harm you recentlyo No Do you feel uncomfortable with anyone in your familyo No Has anyone forced you do things that you didnot want to doo No Electronic Signature(s) Signed: 09/06/2020 10:44:22 AM By: Rodell Perna Entered By: Rodell Perna on 09/06/2020 09:55:17 [name] Hilts A. ([id number]) -------------------------------------------------------------------------------- Activities of Daily Living Details Patient Name: [name] Hilts A. Date of Service: 09/06/2020 9:45 AM Medical Record Number: [id number] Patient Account Number: [id number] Date of Birth/Sex: 10-07-1931 (86 y.o. M) Treating RN: Rodell Perna Primary Care Crytal Pensinger: [name] Other Clinician: Referring Milka Windholz: Referral, Self Treating Sarthak Rubenstein/Extender: Altamese [place] in Treatment: 0 Activities of Daily Living Items Answer Activities of Daily Living (Please select one for each item) Drive Automobile Not Able Take Medications Completely Able Use Telephone Completely Able Care for Appearance Completely Able Use Toilet Completely Able Bath / Shower Completely Able Dress Self Completely Able Feed Self Completely Able Walk Need Assistance Get In / Out Bed Need Assistance Housework Need Assistance Prepare Meals Need Assistance Handle Money Completely Able Shop for Self Completely Able Electronic Signature(s) Signed: 09/06/2020 10:44:22 AM By: Rodell Perna Entered By: Rodell Perna on 09/06/2020 09:55:40 [name] Hilts A. ([id number]) -------------------------------------------------------------------------------- Education Screening Details Patient Name: [name] Hilts A. Date of Service: 09/06/2020 9:45 AM Medical Record Number: [id number] Patient Account Number: [id number] Date of Birth/Sex: 08/12/1931 (86 y.o. M) Treating RN: Rodell Perna Primary Care Wannetta Langland: [name] Other Clinician: Referring Elimelech Houseman: Referral, Self Treating Welden Hausmann/Extender: Altamese [place] in Treatment: 0 Primary Learner Assessed: Patient Learning Preferences/Education Level/Primary Language Learning Preference: Explanation Highest Education Level: High School Preferred Language: English Cognitive Barrier Language Barrier: No Translator Needed: No Memory Deficit: No Emotional Barrier: No Cultural/Religious Beliefs Affecting Medical Care: No Physical Barrier Impaired Vision: No Impaired Hearing: No Decreased Hand dexterity: No Knowledge/Comprehension Knowledge Level: High Comprehension Level: High Ability to understand written instructions: High Ability to understand verbal instructions: High Motivation Anxiety Level: Calm Cooperation: Cooperative Education Importance: Acknowledges Need Interest in Health Problems: Asks Questions Perception: Coherent Willingness to Engage in Self-Management High Activities: Readiness to Engage in Self-Management High Activities: Electronic Signature(s) Signed: 09/06/2020 10:44:22 AM By: Rodell Perna Entered By: Rodell Perna on 09/06/2020 09:56:04 [name] Hilts A. ([id number]) -------------------------------------------------------------------------------- Fall Risk Assessment Details Patient Name: [name] Hilts A. Date of Service: 09/06/2020 9:45 AM Medical Record Number: [id number] Patient Account Number: [id number] Date of Birth/Sex: Apr 19, 1931 (86 y.o. M) Treating RN: Rodell Perna Primary Care Olegario Emberson: [name] Other Clinician: Referring Julita Ozbun: Referral, Self Treating Keric Zehren/Extender: Altamese [place] in Treatment: 0 Fall Risk Assessment Items Have you had 2 or more falls in the last 12 monthso 0 No Have you had any fall that resulted in injury in the last 12 monthso 0 No FALLS RISK SCREEN History of falling - immediate or within 3 months 0 No Secondary diagnosis (Do you have 2 or more medical diagnoseso) 0 No Ambulatory aid None/bed rest/wheelchair/nurse 0 No Crutches/cane/walker 15 Yes Furniture 0 No Intravenous therapy Access/Saline/Heparin Lock 0 No Gait/Transferring Normal/ bed rest/ wheelchair 0 No Weak (short steps with  or without shuffle, stooped but able to lift head while walking, may 10 Yes seek support from furniture) Impaired (short steps with shuffle, may have difficulty arising from chair, head down, impaired 0 No balance) Mental Status Oriented to own ability 0 No Electronic Signature(s) Signed: 09/06/2020 10:44:22 AM By: Rodell Perna Entered By: Rodell Perna on 09/06/2020 09:56:12 [name], [name] ([id number]) -------------------------------------------------------------------------------- Foot Assessment Details Patient Name: [name] Hilts A. Date of Service: 09/06/2020 9:45 AM Medical Record Number: [id number] Patient Account Number: [id number] Date of Birth/Sex: July 26, 1931 (86 y.o. M) Treating RN: Rodell Perna Primary Care Aurie Harroun: [name] Other Clinician: Referring Ladora Osterberg: Referral, Self Treating Aarti Mankowski/Extender: Altamese [place] in Treatment: 0 Foot Assessment Items Site Locations + = Sensation present, - = Sensation absent, C = Callus, U = Ulcer R = Redness, W = Warmth, M = Maceration, PU = Pre-ulcerative lesion F = Fissure, S = Swelling, D = Dryness Assessment Right: Left: Other Deformity: No No Prior Foot Ulcer: No No Prior Amputation: No No Charcot Joint: No No Ambulatory  Status: Ambulatory With Help Assistance Device: Walker Gait: Steady Electronic Signature(s) Signed: 09/06/2020 10:44:22 AM By: Rodell Perna Entered By: Rodell Perna on 09/06/2020 09:57:30 [name] Hilts A. ([id number]) -------------------------------------------------------------------------------- Nutrition Risk Screening Details Patient Name: [name] Hilts A. Date of Service: 09/06/2020 9:45 AM Medical Record Number: [id number] Patient Account Number: [id number] Date of Birth/Sex: July 27, 1931 (86 y.o. M) Treating RN: Rodell Perna Primary Care Deamonte Sayegh: [name] Other Clinician: Referring Vivan Agostino: Referral, Self Treating Twanisha Foulk/Extender: Altamese [place] in Treatment: 0 Height (in): 71 Weight (lbs): 190 Body Mass Index (BMI): 26.5 Nutrition Risk Screening Items Score Screening NUTRITION RISK SCREEN: I have an illness or condition that made me change the kind and/or amount of food I eat 0 No I eat fewer than two meals per day 0 No I eat few fruits and vegetables, or milk products 0 No I have three or more drinks of beer, liquor or wine almost every day 0 No I have tooth or mouth problems that make it hard for me to eat 0 No I don't always have enough money to buy the food I need 0 No I eat alone most of the time 0 No I take three or more different prescribed or over-the-counter drugs a day 0 No Without wanting to, I have lost or gained 10 pounds in the last six months 0 No I am not always physically able to shop, cook and/or feed myself 0 No Nutrition Protocols Good Risk Protocol 0 No interventions needed Moderate Risk Protocol High Risk Proctocol Risk Level: Good Risk Score: 0 Electronic Signature(s) Signed: 09/06/2020 10:44:22 AM By: Rodell Perna Entered By: Rodell Perna on 09/06/2020 09:56:17

## 2020-09-11 ENCOUNTER — Ambulatory Visit: Payer: Medicare Other | Admitting: Family

## 2020-09-11 NOTE — Progress Notes (Signed)
[name], Britney A. ([id number]) Visit Report for 09/06/2020 Allergy List Details Patient Name: [name], [name] Date of Service: 09/06/2020 9:45 AM Medical Record Number: [id number] Patient Account Number: [id number] Date of Birth/Sex: Apr 29, 1931 (86 y.o. M) Treating RN: Rodell PernaScott, Dajea Primary Care Marcelis Wissner: Marcelino DusterJohnston, John Other Clinician: Referring Kristyana Notte: Referral, Self Treating Jujuan Dugo/Extender: Maxwell CaulOBSON, MICHAEL G Weeks in Treatment: 0 Allergies Active Allergies Levaquin Sulfa (Sulfonamide Antibiotics) Allergy Notes Electronic Signature(s) Signed: 09/06/2020 10:44:22 AM By: Rodell PernaScott, Dajea Entered By: Rodell PernaScott, Dajea on 09/06/2020 09:53:23 [name], [name] ([id number]) -------------------------------------------------------------------------------- Arrival Information Details Patient Name: [name], [name] Date of Service: 09/06/2020 9:45 AM Medical Record Number: [id number] Patient Account Number: [id number] Date of Birth/Sex: Apr 29, 1931 (86 y.o. M) Treating RN: Huel CoventryWoody, [name] Primary Care [place]: Marcelino DusterJohnston, John Other Clinician: Referring Hanan Mcwilliams: Referral, Self Treating Amour Cutrone/Extender: Altamese CarolinaOBSON, MICHAEL G Weeks in Treatment: 0 Visit Information Patient Arrived: Walker Arrival Time: 09:34 Accompanied By: daughter Transfer Assistance: None Patient Identification Verified: Yes Secondary Verification Process Completed: Yes History Since Last Visit Added or deleted any medications: No Any new allergies or adverse reactions: No Had a fall or experienced change in activities of daily living that may affect risk of falls: No Signs or symptoms of abuse/neglect since last visito No Hospitalized since last visit: No Implantable device outside of the clinic excluding cellular tissue based products placed in the center since last visit: No Has Dressing in Place as Prescribed: Yes Has Compression in Place as Prescribed: Yes Electronic Signature(s) Signed: 09/06/2020 4:20:28 PM By:  Dayton MartesWallace, RCP,RRT,CHT, Sallie RCP, RRT, CHT Entered By: Dayton MartesWallace, RCP,RRT,CHT, Sallie on 09/06/2020 09:35:46 [name], [name] ([id number]) -------------------------------------------------------------------------------- Clinic Level of Care Assessment Details Patient Name: [name], [name] Date of Service: 09/06/2020 9:45 AM Medical Record Number: [id number] Patient Account Number: [id number] Date of Birth/Sex: Apr 29, 1931 (86 y.o. M) Treating RN: Huel CoventryWoody, [name] Primary Care Mendy Chou: Marcelino DusterJohnston, John Other Clinician: Referring [name]: Referral, Self Treating Lavarius Doughten/Extender: Altamese CarolinaOBSON, MICHAEL G Weeks in Treatment: 0 Clinic Level of Care Assessment Items TOOL 2 Quantity Score []  - Use when only an EandM is performed on the INITIAL visit 0 ASSESSMENTS - Nursing Assessment / Reassessment X - General Physical Exam (combine w/ comprehensive assessment (listed just below) when performed on new 1 20 pt. evals) X- 1 25 Comprehensive Assessment (HX, ROS, Risk Assessments, Wounds Hx, etc.) ASSESSMENTS - Wound and Skin Assessment / Reassessment X - Simple Wound Assessment / Reassessment - one wound 1 5 []  - 0 Complex Wound Assessment / Reassessment - multiple wounds []  - 0 Dermatologic / Skin Assessment (not related to wound area) ASSESSMENTS - Ostomy and/or Continence Assessment and Care []  - Incontinence Assessment and Management 0 []  - 0 Ostomy Care Assessment and Management (repouching, etc.) PROCESS - Coordination of Care X - Simple Patient / Family Education for ongoing care 1 15 []  - 0 Complex (extensive) Patient / Family Education for ongoing care []  - 0 Staff obtains ChiropractorConsents, Records, Test Results / Process Orders []  - 0 Staff telephones HHA, Nursing Homes / Clarify orders / etc []  - 0 Routine Transfer to another Facility (non-emergent condition) []  - 0 Routine Hospital Admission (non-emergent condition) []  - 0 New Admissions / Manufacturing engineernsurance Authorizations / Ordering NPWT,  Apligraf, etc. []  - 0 Emergency Hospital Admission (emergent condition) X- 1 10 Simple Discharge Coordination []  - 0 Complex (extensive) Discharge Coordination PROCESS - Special Needs []  - Pediatric / Minor Patient Management 0 []  - 0 Isolation Patient Management []  - 0 Hearing / Language / Visual special needs []  - 0  Assessment of Community assistance (transportation, D/C planning, etc.) []  - 0 Additional assistance / Altered mentation []  - 0 Support Surface(s) Assessment (bed, cushion, seat, etc.) INTERVENTIONS - Wound Cleansing / Measurement X - Wound Imaging (photographs - any number of wounds) 1 5 []  - 0 Wound Tracing (instead of photographs) X- 1 5 Simple Wound Measurement - one wound []  - 0 Complex Wound Measurement - multiple wounds [name], [name] ( ) X- 1 5 Simple Wound Cleansing - one wound []  - 0 Complex Wound Cleansing - multiple wounds INTERVENTIONS - Wound Dressings []  - Small Wound Dressing one or multiple wounds 0 []  - 0 Medium Wound Dressing one or multiple wounds []  - 0 Large Wound Dressing one or multiple wounds []  - 0 Application of Medications - injection INTERVENTIONS - Miscellaneous []  - External ear exam 0 []  - 0 Specimen Collection (cultures, biopsies, blood, body fluids, etc.) []  - 0 Specimen(s) / Culture(s) sent or taken to Lab for analysis []  - 0 Patient Transfer (multiple staff / / Similar devices) []  - 0 Simple Staple / Suture removal (25 or less) []  - 0 Complex Staple / Suture removal (26 or more) []  - 0 Hypo / Hyperglycemic Management (close monitor of Blood Glucose) []  - 0 Ankle / Brachial Index (ABI) - do not check if billed separately Has the patient been seen at the hospital within the last three years: Yes Total Score: 90 Level Of Care: New/Established - Level 3 Electronic Signature(s) Signed: 09/11/2020 10:00:34 AM By: , BSN, RN, CWS, [name] RN, BSN Entered By: [id number], BSN, RN, CWS, [name] on  09/06/2020 10:20:26 ( ) -------------------------------------------------------------------------------- Encounter Discharge Information Details Patient Name: A. Date of Service: 09/06/2020 9:45 AM Medical Record Number: Patient Account Number: Date of Birth/Sex: 1930/12/31 (86 y.o. M) Treating RN: Nurse, adult Primary Care Zaynab Chipman: Other Clinician: Referring Taner Rzepka: Referral, Self Treating Tayari Yankee/Extender: in Treatment: 0 Encounter Discharge Information Items Discharge Condition: Stable Ambulatory Status: Walker Discharge Destination: Home Transportation: Private Auto Accompanied By: daughter Schedule Follow-up Appointment: Yes Clinical Summary of Care: Electronic Signature(s) Signed: 09/11/2020 10:00:34 AM By: , BSN, RN, CWS, [name] RN, BSN Entered By: 09/13/2020, BSN, RN, CWS, [name] on 09/06/2020 10:22:27 Elliot Gurney (09/08/2020) -------------------------------------------------------------------------------- Lower Extremity Assessment Details Patient Name: JOHNATON, SONNEBORN A. Date of Service: 09/06/2020 9:45 AM Medical Record Number: [name] Hilts Patient Account Number: 09/08/2020 Date of Birth/Sex: 1931-04-08 (86 y.o. M) Treating RN: 09/14/1931 Primary Care Hendricks Schwandt: 03-22-1992 Other Clinician: Referring Troy Kanouse: Referral, Self Treating Dekisha Mesmer/Extender: Huel Coventry in Treatment: 0 Edema Assessment Assessed: [Left: No] [Right: No] Edema: [Left: Yes] [Right: Yes] Calf Left: Right: Point of Measurement: 36 cm From Medial Instep 40 cm 37 cm Ankle Left: Right: Point of Measurement: 12 cm From Medial Instep 23 cm 22 cm Vascular Assessment Pulses: Dorsalis Pedis Palpable: [Left:Yes] [Right:Yes] Electronic Signature(s) Signed: 09/06/2020 10:44:22 AM By: Altamese [place] Entered By: 09/13/2020 on 09/06/2020 10:03:09 Elliot Gurney A.  (09/08/2020) -------------------------------------------------------------------------------- Multi Wound Chart Details Patient Name: [name] A. Date of Service: 09/06/2020 9:45 AM Medical Record Number: [name] Hilts Patient Account Number: 09/08/2020 Date of Birth/Sex: Mar 10, 1931 (86 y.o. M) Treating RN: 09/14/1931 Primary Care Anjel Perfetti: 03-22-1992 Other Clinician: Referring Estaban [place]: Referral, Self Treating Omero Kowal/Extender: Rodell Perna in Treatment: 0 Vital Signs Height(in): 71 Pulse(bpm): 73 Weight(lbs): 190 Blood Pressure(mmHg): 110/68 Body Mass Index(BMI): 26 Temperature(F): 97.8 Respiratory Rate(breaths/min): 18 Photos: [N/A:N/A] Wound Location: Left, Medial Lower  Leg N/A N/A Wounding Event: Gradually Appeared N/A N/A Primary Etiology: Venous Leg Ulcer N/A N/A Comorbid History: Cataracts, Sleep Apnea, Congestive N/A N/A Heart Failure, Coronary Artery Disease, Hypertension, Osteoarthritis Date Acquired: 11/26/2019 N/A N/A Weeks of Treatment: 0 N/A N/A Wound Status: Healed - Epithelialized N/A N/A Measurements L x W x D (cm) 0x0x0 N/A N/A Area (cm) : 0 N/A N/A Volume (cm) : 0 N/A N/A % Reduction in Area: 100.00% N/A N/A % Reduction in Volume: 100.00% N/A N/A Classification: Partial Thickness N/A N/A Exudate Amount: Medium N/A N/A Exudate Type: Serous N/A N/A Exudate Color: amber N/A N/A Wound Margin: Flat and Intact N/A N/A Granulation Amount: None Present (0%) N/A N/A Necrotic Amount: Large (67-100%) N/A N/A Necrotic Tissue: Eschar, Adherent Slough N/A N/A Exposed Structures: Fat Layer (Subcutaneous Tissue): N/A N/A Yes Fascia: No Tendon: No Muscle: No Joint: No Bone: No Epithelialization: Medium (34-66%) N/A N/A Treatment Notes Electronic Signature(s) Signed: 09/06/2020 4:31:41 PM By: Baltazar Najjar MD Entered By: Baltazar Najjar on 09/06/2020 10:27:27 [name] Hilts A.  ([id number]) -------------------------------------------------------------------------------- Pain Assessment Details Patient Name: [name] Hilts A. Date of Service: 09/06/2020 9:45 AM Medical Record Number: [id number] Patient Account Number: [id number] Date of Birth/Sex: 11/15/1931 (86 y.o. M) Treating RN: Huel Coventry Primary Care Dee Maday: [name] Other Clinician: Referring Daiel Strohecker: Referral, Self Treating Ahja Martello/Extender: Altamese [place] in Treatment: 0 Active Problems Location of Pain Severity and Description of Pain Patient Has Paino No Site Locations Pain Management and Medication Current Pain Management: Electronic Signature(s) Signed: 09/06/2020 4:20:28 PM By: Dayton Martes RCP, RRT, CHT Signed: 09/11/2020 10:00:34 AM By: Elliot Gurney, BSN, RN, CWS, [name] RN, BSN Entered By: Dayton Martes on 09/06/2020 09:35:55 [name] ([id number]) -------------------------------------------------------------------------------- Patient/Caregiver Education Details Patient Name: [name] Hilts A. Date of Service: 09/06/2020 9:45 AM Medical Record Number: [id number] Patient Account Number: [id number] Date of Birth/Gender: [birth year] (86 y.o. M) Treating RN: Huel Coventry Primary Care Physician: [name] Other Clinician: Referring Physician: Referral, Self Treating Physician/Extender: Altamese [place] in Treatment: 0 Education Assessment Education Provided To: Patient Education Topics Provided Venous: Handouts: Controlling Swelling with Compression Stockings Methods: Demonstration, Explain/Verbal Responses: State content correctly [facility]: Handouts: [facility] Methods: Demonstration, Explain/Verbal Responses: State content correctly Electronic Signature(s) Signed: 09/11/2020 10:00:34 AM By: Elliot Gurney, BSN, RN, CWS, [name] RN, BSN Entered By: Elliot Gurney, BSN, RN, CWS, [name] on 09/06/2020  10:21:18 [name] ([id number]) -------------------------------------------------------------------------------- Wound Assessment Details Patient Name: [name] Hilts A. Date of Service: 09/06/2020 9:45 AM Medical Record Number: [id number] Patient Account Number: [id number] Date of Birth/Sex: [birth year] (86 y.o. M) Treating RN: Huel Coventry Primary Care Mylz Yuan: [name] Other Clinician: Referring Lief Palmatier: Referral, Self Treating Cylah Fannin/Extender: Altamese [place] in Treatment: 0 Wound Status Wound Number: 7 Primary Venous Leg Ulcer Etiology: Wound Location: Left, Medial Lower Leg Wound Healed - Epithelialized Wounding Event: Gradually Appeared Status: Date Acquired: 11/26/2019 Comorbid Cataracts, Sleep Apnea, Congestive Heart Failure, Weeks Of Treatment: 0 History: Coronary Artery Disease, Hypertension, Osteoarthritis Clustered Wound: No Photos Wound Measurements Length: (cm) 0 Width: (cm) 0 Depth: (cm) 0 Area: (cm) 0 Volume: (cm) 0 % Reduction in Area: 100% % Reduction in Volume: 100% Epithelialization: Medium (34-66%) Tunneling: No Undermining: No Wound Description Classification: Partial Thickness Wound Margin: Flat and Intact Exudate Amount: Medium Exudate Type: Serous Exudate Color: amber Foul Odor After Cleansing: No Slough/Fibrino Yes Wound Bed Granulation Amount: None Present (0%) Exposed Structure Necrotic Amount: Large (67-100%) Fascia Exposed: No Necrotic  Quality: Eschar, Adherent Slough Fat Layer (Subcutaneous Tissue) Exposed: Yes Tendon Exposed: No Muscle Exposed: No Joint Exposed: No Bone Exposed: No Electronic Signature(s) Signed: 09/11/2020 10:00:34 AM By: Elliot Gurney, BSN, RN, CWS, [name] RN, BSN Entered By: Elliot Gurney, BSN, RN, CWS, [name] on 09/06/2020 10:21:46 [name] ([id number]) -------------------------------------------------------------------------------- Vitals Details Patient Name: [name] Hilts A. Date of Service:  09/06/2020 9:45 AM Medical Record Number: [id number] Patient Account Number: [id number] Date of Birth/Sex: Aug 25, 1931 (86 y.o. M) Treating RN: Huel Coventry Primary Care Novah Nessel: [name] Other Clinician: Referring Arlissa Monteverde: Referral, Self Treating Mackenzie Lia/Extender: Altamese [place] in Treatment: 0 Vital Signs Time Taken: 09:30 Temperature (F): 97.8 Height (in): 71 Pulse (bpm): 73 Weight (lbs): 190 Respiratory Rate (breaths/min): 18 Body Mass Index (BMI): 26.5 Blood Pressure (mmHg): 110/68 Reference Range: 80 - 120 mg / dl Electronic Signature(s) Signed: 09/06/2020 4:20:28 PM By: Dayton Martes RCP, RRT, CHT Entered By: Dayton Martes on 09/06/2020 09:36:41

## 2020-09-11 NOTE — Progress Notes (Signed)
KEISHAUN, HAZEL ([id number]) Visit Report for 09/06/2020 Chief Complaint Document Details Patient Name: [name], [name] Date of Service: 09/06/2020 9:45 AM Medical Record Number: [id number] Patient Account Number: [id number] Date of Birth/Sex: 10-21-1931 (86 y.o. M) Treating RN: Huel Coventry Primary Care Provider: [name] Other Clinician: Referring Provider: Referral, Self Treating Provider/Extender: Altamese [place] in Treatment: 0 Information Obtained from: Patient Chief Complaint 04/21/2019; patient is here for review of wounds x2 on his right lower extremity. 09/06/2020; patient returns to clinic for review of his bilateral lower extremity issues predominantly chronic venous insufficiency/stasis dermatitis and lymphedema after 2 hospitalizations in 2 separate stays at the skilled level of care at [facility] [employer]) Signed: 09/06/2020 4:31:41 PM By: Baltazar Najjar MD Entered By: Baltazar Najjar on 09/06/2020 10:28:22 [name] ([id number]) -------------------------------------------------------------------------------- HPI Details Patient Name: [name] Date of Service: 09/06/2020 9:45 AM Medical Record Number: [id number] Patient Account Number: [id number] Date of Birth/Sex: 29-Sep-1931 (86 y.o. M) Treating RN: Huel Coventry Primary Care Provider: [name] Other Clinician: Referring Provider: Referral, Self Treating Provider/Extender: Altamese [place] in Treatment: 0 History of Present Illness HPI Description: 04/21/2019 ADMISSION This is an independent 86 year old man who lives in the independent part of [address] of [place]. He has a history of chronic lower extremity edema and wears compression stockings. He states about a month ago he was taking off the one on the right and pulled some skin off accidentally. He has had 2 small wounds on the right anterior and right medial lower extremity. I note looking through  [facility] health link that he had multiple venous ultrasounds in 2015 and 16. These were DVT rule outs. He did have a Baker's cyst on the left. He has not not had a previous wound history although he did have a history of cellulitis in his legs. Past medical history; includes aortic stenosis status post mechanical AVR in 2007 on chronic Coumadin, lower extremity edema with a history of cellulitis, history of MRSA. ABIs in our clinic were 1.1 on the right 6/3; patient with predominantly venous insufficiency ulcers in the right lower leg probably some degree of lymphedema. He had to wounds last week. We put him in 3 layer compression. The nurses at [facility] are changing the dressing. The area laterally is healed but he still has a small very painful area on the anterior tibial area. He is on Coumadin because of mechanical aortic valve. 6/10; venous insufficiency ulcers on the right lower leg. He has some degree of lymphedema. We have been using 3 layer compression with silver collagen small wound. Dimensions are improved. I gave him doxycycline last week which he tolerated because of surrounding erythema. This is improved also 6/17; arrives in clinic today with portable silver collagen dressing tightly adherent to the wound. Also felt that the wrap was too tight 3 layer being changed at the [facility] 6/24; patient's wound is small still adherent debris over the surface however even under illumination it was hard to see what was still open here. We have been using silver collagen 7/1; the patient arrives in the clinic today and the area on the right lower leg is healed. He has severe chronic venous insufficiency. He has 20/30 compression stockings. Readmission: 06/22/2020 on evaluation today patient presents for reevaluation here in our clinic although it has been a little over a year since we last saw him. He does have a history of lymphedema, venous insufficiency, anticoagulant  therapy, he  is on a pacemaker which is the reason for the anticoagulants, hypertension, and congestive heart failure. With that being said unfortunately he has been dealing with a wound of the left and right legs which has been giving him some trouble since arrival first 2021. That is in regard to the left leg ulcer. In regard to the right leg ulcer this is just a very small area that really I think will seal up quite nicely is more of the lymphedema opening than anything else. With that being said unfortunately the left leg is quite significant. We actually have noted that the patient's been dealing with this for quite some time and I think that there is a chance we may want to consider biopsy. With that being said he is on Coumadin therefore we were not able to perform a biopsy at this point. I think that something however in the future that may be warranted we may just have to take him off of the current Coumadin regimen in order to do this. We discussed doing it today but I am more concerned about the fact that he could have issues with uncontrolled bleeding and in that case we may have to send him to the ER. His daughter was not wanting to take that chance which I can completely understand. Nonetheless so far they have been using antibiotic ointment over the area I am just putting a protective dressing I do believe the patient is that he needs some type of compression. 07/07/2020 on evaluation today patient appears to be doing poorly in regard to his bilateral lower extremities today. He actually went to urgent care last night due to having pain in his legs due to the wraps. It appears they got somewhat tight on him because of the fact that he is having such significant swelling. He appears to be fluid overloaded I do not see any signs of infection actively at this point which is good news but unfortunately I think that this is something that may need to be addressed even by cardiology more so than  just with a different or alternate type of wrap. I will contact his cardiologist, Dr. Mariah Milling, as well. 07/10/2020 upon evaluation today patient unfortunately appears to be doing worse even than when I saw him on Friday from the standpoint of where his legs stand. The main issue is that he has increased erythema although there is still not warm to touch I am concerned about infection and about this getting significantly worse. Again my initial concern was more fluid overload and I discussed this with his cardiology office on Friday. With that being said I feel like this has gotten worse his daughter is in agreement and I think that the best option would be to send him to the hospital at this point. READMISSION 09/06/2020 Is a patient who has had 2 stays in this clinic firstly in 2020 and more recently from 06/02/2020 through 07/10/2020. He was cared for by Allen Derry. When he was last here he had bilateral lower extremity ulcers in the setting of chronic venous insufficiency with lymphedema. When he we last saw him he had weeping edema fluido Cellulitis and he was sent to the hospital. He was admitted initially from 06/30/2020 through 07/14/2020 with cellulitis plus stasis dermatitis. Treated empirically with vancomycin and had increases in his torsemide. He was then sent to the skilled level of [place] but readmitted to hospital from 07/29/2020 through 08/10/2020 with blood loss anemia hemoglobin of 4 and  OB positive stools. During this hospitalization he was noted to have a sacral wound at stage II. He was sent to [facility] [place] again in the skilled level. He had compression on his legs with Coban. He is now back at his independent living setting. He has not been wearing compression and his daughter feels there is already increasing swelling. To have stockings at home not exactly sure at what strength. RYLEN, SWINDLER A. ([id number]) Past medical history includes lymphedema, chronic venous  insufficiency, pacemaker, congestive heart failure, hypertension, aortic valve stenosis status post mechanical valve replacement in 2002 on chronic Coumadin, chronic kidney disease stage IIIb, atrial fibrillation, peripheral neuropathy, large hiatal hernia. We did not test his ABIs in the clinic today. Electronic Signature(s) Signed: 09/06/2020 4:31:41 PM By: Baltazar Najjar MD Entered By: Baltazar Najjar on 09/06/2020 10:32:53 Joellyn Quails ([id number]) -------------------------------------------------------------------------------- Physical Exam Details Patient Name: [name] Date of Service: 09/06/2020 9:45 AM Medical Record Number: [id number] Patient Account Number: [id number] Date of Birth/Sex: Feb 17, 1931 (86 y.o. M) Treating RN: Huel Coventry Primary Care Provider: [name] Other Clinician: Referring Provider: Referral, Self Treating Provider/Extender: Altamese [place] in Treatment: 0 Constitutional Sitting or standing Blood Pressure is within target range for patient.. Pulse regular and within target range for patient.Marland Kitchen Respirations regular, non- labored and within target range.. Temperature is normal and within the target range for the patient.Marland Kitchen appears in no distress. Respiratory Respiratory effort is easy and symmetric bilaterally. Rate is normal at rest and on room air.. Bilateral breath sounds are clear and equal in all lobes with no wheezes, rales or rhonchi.. Cardiovascular Mechanical second sound. No murmur JVP is elevated. Pedal pulses are palpable bilaterally. Mild nonpitting edema. Brawny erythema in both legs.. Integumentary (Hair, Skin) There is no open wound. Skin discoloration as described this includes underlying hemosiderin staining but with some degree of erythema but no palpable tenderness. Psychiatric No evidence of depression, anxiety, or agitation. Calm, cooperative, and communicative. Appropriate interactions and  affect.. Notes Wound exam; the patient had an eschar on the left medial calf I remove this there was no open wound underneath this. I see no other concerning areas. We did not look at the sacral wound but apparently this is healed per his daughter Electronic Signature(s) Signed: 09/06/2020 4:31:41 PM By: Baltazar Najjar MD Entered By: Baltazar Najjar on 09/06/2020 10:36:36 [name] ([id number]) -------------------------------------------------------------------------------- Physician Orders Details Patient Name: [name] Date of Service: 09/06/2020 9:45 AM Medical Record Number: [id number] Patient Account Number: [id number] Date of Birth/Sex: 12/30/1930 (86 y.o. M) Treating RN: Huel Coventry Primary Care Provider: [name] Other Clinician: Referring Provider: Referral, Self Treating Provider/Extender: Altamese [place] in Treatment: 0 Verbal / Phone Orders: No Diagnosis Coding Edema Control Wound #7 Left,Medial Lower Leg o Patient to wear own compression stockings - Gave patient measurements: Calf 45cm; Ankle 25; 44 Length 20-30 mmHg Discharge From [facility] Services Wound #7 Left,Medial Lower Leg o Discharge from Wound Care Center - Consult Electronic Signature(s) Signed: 09/06/2020 4:31:41 PM By: Baltazar Najjar MD Signed: 09/11/2020 10:00:34 AM By: Elliot Gurney, BSN, RN, CWS, [name] RN, BSN Entered By: Elliot Gurney, BSN, RN, CWS, [name] on 09/06/2020 10:19:49 Joellyn Quails ([id number]) -------------------------------------------------------------------------------- Problem List Details Patient Name: GERRALD, BASU A. Date of Service: 09/06/2020 9:45 AM Medical Record Number: [id number] Patient Account Number: [id number] Date of Birth/Sex: [birth year] (86 y.o. M) Treating RN: Huel Coventry Primary Care Provider: [name] Other Clinician: Referring Provider: Referral, Self Treating Provider/Extender: Altamese [place] in  Treatment: 0 Active  Problems ICD-10 Encounter Code Description Active Date MDM Diagnosis I87.323 Chronic venous hypertension (idiopathic) with inflammation of bilateral 09/06/2020 No Yes lower extremity I89.0 Lymphedema, not elsewhere classified 09/06/2020 No Yes I50.32 Chronic diastolic (congestive) heart failure 09/06/2020 No Yes Inactive Problems Resolved Problems Electronic Signature(s) Signed: 09/06/2020 4:31:41 PM By: Baltazar Najjar MD Entered By: Baltazar Najjar on 09/06/2020 10:27:15 [name] ([id number]) -------------------------------------------------------------------------------- Progress Note Details Patient Name: [name] Date of Service: 09/06/2020 9:45 AM Medical Record Number: [id number] Patient Account Number: [id number] Date of Birth/Sex: 13-Apr-1931 (86 y.o. M) Treating RN: Huel Coventry Primary Care Provider: [name] Other Clinician: Referring Provider: Referral, Self Treating Provider/Extender: Altamese [place] in Treatment: 0 Subjective Chief Complaint Information obtained from Patient 04/21/2019; patient is here for review of wounds x2 on his right lower extremity. 09/06/2020; patient returns to clinic for review of his bilateral lower extremity issues predominantly chronic venous insufficiency/stasis dermatitis and lymphedema after 2 hospitalizations in 2 separate stays at the skilled level of care at [facility] History of Present Illness (HPI) 04/21/2019 ADMISSION This is an independent 86 year old man who lives in the independent part of [address] of [place]. He has a history of chronic lower extremity edema and wears compression stockings. He states about a month ago he was taking off the one on the right and pulled some skin off accidentally. He has had 2 small wounds on the right anterior and right medial lower extremity. I note looking through [facility] health link that he had multiple venous ultrasounds in 2015 and 16. These were DVT  rule outs. He did have a Baker's cyst on the left. He has not not had a previous wound history although he did have a history of cellulitis in his legs. Past medical history; includes aortic stenosis status post mechanical AVR in 2007 on chronic Coumadin, lower extremity edema with a history of cellulitis, history of MRSA. ABIs in our clinic were 1.1 on the right 6/3; patient with predominantly venous insufficiency ulcers in the right lower leg probably some degree of lymphedema. He had to wounds last week. We put him in 3 layer compression. The nurses at [facility] are changing the dressing. The area laterally is healed but he still has a small very painful area on the anterior tibial area. He is on Coumadin because of mechanical aortic valve. 6/10; venous insufficiency ulcers on the right lower leg. He has some degree of lymphedema. We have been using 3 layer compression with silver collagen small wound. Dimensions are improved. I gave him doxycycline last week which he tolerated because of surrounding erythema. This is improved also 6/17; arrives in clinic today with portable silver collagen dressing tightly adherent to the wound. Also felt that the wrap was too tight 3 layer being changed at the [facility] 6/24; patient's wound is small still adherent debris over the surface however even under illumination it was hard to see what was still open here. We have been using silver collagen 7/1; the patient arrives in the clinic today and the area on the right lower leg is healed. He has severe chronic venous insufficiency. He has 20/30 compression stockings. Readmission: 06/22/2020 on evaluation today patient presents for reevaluation here in our clinic although it has been a little over a year since we last saw him. He does have a history of lymphedema, venous insufficiency, anticoagulant therapy, he is on a pacemaker which is the reason for the anticoagulants, hypertension,  and  congestive heart failure. With that being said unfortunately he has been dealing with a wound of the left and right legs which has been giving him some trouble since arrival first 2021. That is in regard to the left leg ulcer. In regard to the right leg ulcer this is just a very small area that really I think will seal up quite nicely is more of the lymphedema opening than anything else. With that being said unfortunately the left leg is quite significant. We actually have noted that the patient's been dealing with this for quite some time and I think that there is a chance we may want to consider biopsy. With that being said he is on Coumadin therefore we were not able to perform a biopsy at this point. I think that something however in the future that may be warranted we may just have to take him off of the current Coumadin regimen in order to do this. We discussed doing it today but I am more concerned about the fact that he could have issues with uncontrolled bleeding and in that case we may have to send him to the ER. His daughter was not wanting to take that chance which I can completely understand. Nonetheless so far they have been using antibiotic ointment over the area I am just putting a protective dressing I do believe the patient is that he needs some type of compression. 07/07/2020 on evaluation today patient appears to be doing poorly in regard to his bilateral lower extremities today. He actually went to urgent care last night due to having pain in his legs due to the wraps. It appears they got somewhat tight on him because of the fact that he is having such significant swelling. He appears to be fluid overloaded I do not see any signs of infection actively at this point which is good news but unfortunately I think that this is something that may need to be addressed even by cardiology more so than just with a different or alternate type of wrap. I will contact his cardiologist, Dr.  Mariah Milling, as well. 07/10/2020 upon evaluation today patient unfortunately appears to be doing worse even than when I saw him on Friday from the standpoint of where his legs stand. The main issue is that he has increased erythema although there is still not warm to touch I am concerned about infection and about this getting significantly worse. Again my initial concern was more fluid overload and I discussed this with his cardiology office on Friday. With that being said I feel like this has gotten worse his daughter is in agreement and I think that the best option would be to send him to the hospital at this point. READMISSION 09/06/2020 Is a patient who has had 2 stays in this clinic firstly in 2020 and more recently from 06/02/2020 through 07/10/2020. He was cared for by Laney Potash, Isaul A. ([id number]) [place]. When he was last here he had bilateral lower extremity ulcers in the setting of chronic venous insufficiency with lymphedema. When he we last saw him he had weeping edema fluido Cellulitis and he was sent to the hospital. He was admitted initially from 06/30/2020 through 07/14/2020 with cellulitis plus stasis dermatitis. Treated empirically with vancomycin and had increases in his torsemide. He was then sent to the skilled level of [place] but readmitted to hospital from 07/29/2020 through 08/10/2020 with blood loss anemia hemoglobin of 4 and OB positive stools. During this hospitalization he was noted  to have a sacral wound at stage II. He was sent to [facility] [place] again in the skilled level. He had compression on his legs with Coban. He is now back at his independent living setting. He has not been wearing compression and his daughter feels there is already increasing swelling. To have stockings at home not exactly sure at what strength. Past medical history includes lymphedema, chronic venous insufficiency, pacemaker, congestive heart failure, hypertension, aortic valve  stenosis status post mechanical valve replacement in 2002 on chronic Coumadin, chronic kidney disease stage IIIb, atrial fibrillation, peripheral neuropathy, large hiatal hernia. We did not test his ABIs in the clinic today. Patient History Information obtained from Patient. Allergies Levaquin, Sulfa (Sulfonamide Antibiotics) Family History Diabetes - Mother, Heart Disease - Mother,Father, Hypertension - Mother,Father, Stroke - Father,Siblings, No family history of Cancer, Hereditary Spherocytosis, Kidney Disease, Lung Disease, Seizures, Thyroid Problems, Tuberculosis. Social History Former smoker - 1975, Marital Status - Married, Alcohol Use - Moderate, Drug Use - No History, Caffeine Use - Daily. Medical History Eyes Patient has history of Cataracts Denies history of Glaucoma, Optic Neuritis Respiratory Patient has history of Sleep Apnea Denies history of Aspiration, Asthma, Chronic Obstructive Pulmonary Disease (COPD), Pneumothorax, Tuberculosis Cardiovascular Patient has history of Congestive Heart Failure, Coronary Artery Disease, Hypertension Denies history of Angina, Arrhythmia, Deep Vein Thrombosis, Hypotension, Myocardial Infarction, Peripheral Arterial Disease, Peripheral Venous Disease, Phlebitis, Vasculitis Gastrointestinal Denies history of Cirrhosis , Colitis, Crohn s, Hepatitis A, Hepatitis B, Hepatitis C Integumentary (Skin) Denies history of History of Burn, History of pressure wounds Musculoskeletal Patient has history of Osteoarthritis Denies history of Gout, Rheumatoid Arthritis, Osteomyelitis Medical And Surgical History Notes Cardiovascular bradycardia, aortic valve replaced 2002, carotid bruits Gastrointestinal hx GI bleed Review of Systems (ROS) Ear/Nose/Mouth/Throat Denies complaints or symptoms of Difficult clearing ears, Sinusitis. Hematologic/Lymphatic Denies complaints or symptoms of Bleeding / Clotting Disorders, Human Immunodeficiency  Virus. Gastrointestinal Denies complaints or symptoms of Frequent diarrhea, Nausea, Vomiting. Endocrine Denies complaints or symptoms of Hepatitis, Thyroid disease, Polydypsia (Excessive Thirst). Genitourinary Denies complaints or symptoms of Kidney failure/ Dialysis, Incontinence/dribbling. Immunological Denies complaints or symptoms of Hives, Itching. Integumentary (Skin) Denies complaints or symptoms of Wounds, Bleeding or bruising tendency, Breakdown, Swelling. Neurologic Denies complaints or symptoms of Numbness/parasthesias, Focal/Weakness. Psychiatric Denies complaints or symptoms of Anxiety, Claustrophobia. Bridgers, Nashaun A. ([id number]) Objective Constitutional Sitting or standing Blood Pressure is within target range for patient.. Pulse regular and within target range for patient.Marland Kitchen Respirations regular, non- labored and within target range.. Temperature is normal and within the target range for the patient.Marland Kitchen appears in no distress. Vitals Time Taken: 9:30 AM, Height: 71 in, Weight: 190 lbs, BMI: 26.5, Temperature: 97.8 F, Pulse: 73 bpm, Respiratory Rate: 18 breaths/min, Blood Pressure: 110/68 mmHg. Respiratory Respiratory effort is easy and symmetric bilaterally. Rate is normal at rest and on room air.. Bilateral breath sounds are clear and equal in all lobes with no wheezes, rales or rhonchi.. Cardiovascular Mechanical second sound. No murmur JVP is elevated. Pedal pulses are palpable bilaterally. Mild nonpitting edema. Brawny erythema in both legs.Marland Kitchen Psychiatric No evidence of depression, anxiety, or agitation. Calm, cooperative, and communicative. Appropriate interactions and affect.. General Notes: Wound exam; the patient had an eschar on the left medial calf I remove this there was no open wound underneath this. I see no other concerning areas. We did not look at the sacral wound but apparently this is healed per his daughter Integumentary (Hair, Skin) There is no open  wound. Skin discoloration as described  this includes underlying hemosiderin staining but with some degree of erythema but no palpable tenderness. Wound #7 status is Healed - Epithelialized. Original cause of wound was Gradually Appeared. The wound is located on the Left,Medial Lower Leg. The wound measures 0cm length x 0cm width x 0cm depth; 0cm^2 area and 0cm^3 volume. There is Fat Layer (Subcutaneous Tissue) exposed. There is no tunneling or undermining noted. There is a medium amount of serous drainage noted. The wound margin is flat and intact. There is no granulation within the wound bed. There is a large (67-100%) amount of necrotic tissue within the wound bed including Eschar and Adherent Slough. Assessment Active Problems ICD-10 Chronic venous hypertension (idiopathic) with inflammation of bilateral lower extremity Lymphedema, not elsewhere classified Chronic diastolic (congestive) heart failure Plan Edema Control: Wound #7 Left,Medial Lower Leg: Patient to wear own compression stockings - Gave patient measurements: Calf 45cm; Ankle 25; 44 Length 20-30 mmHg Discharge From [facility] Services: Wound #7 Left,Medial Lower Leg: Discharge from Wound Care Center - Consult #1 we ordered the patient 20/30 below-knee stockings gave the patient and his daughter the phone number for elastic therapy. I think they may have a pair of 20/30 stockings that he can use in the interim although these may be support hose 2. The patient has chronic venous insufficiency with stasis dermatitis and lymphedema. His edema is I gather considerably better than what we have recently experienced in this clinic. 3. I am uncertain at this point whether 20/30 below-knee stockings will be sufficient for this man however I think that is where we start with this. 4. Chronic diastolic heart failure. He weighs himself every day. His jugular venous pressure is elevated but I did not see much evidence other than Butcher, Khaleed A.  ([id number]) this of congestive failure. He follows with his primary Dr. Letitia Libra at the [facility]. 5. Noteworthy that he does have significant stasis dermatitis. This is not tender. This is not cellulitis. He went through the usual routine for people that are hospitalized in terms of keeping his leg elevated, minimal out minimal lab ambulation which of course improves the amount of edema and erythema and swelling in the her lower extremities. 6. I do not believe is necessary for this patient to be followed in this clinic at this time but will certainly remain available to them if wound issues in his lower extremities resurface. He was carefully schooled about the use of his compression stockings, leg elevation and moisturizing skin cream at night. Much of this he seemed already familiar with I spent 35 minutes in review of this patient's past medical history, face-to-face evaluation and preparation of this record Electronic Signature(s) Signed: 09/06/2020 4:31:41 PM By: Baltazar Najjar MD Entered By: Baltazar Najjar on 09/06/2020 10:40:05 [name] ([id number]) -------------------------------------------------------------------------------- ROS/PFSH Details Patient Name: [name] Date of Service: 09/06/2020 9:45 AM Medical Record Number: [id number] Patient Account Number: [id number] Date of Birth/Sex: 1931-09-08 (86 y.o. M) Treating RN: Rodell Perna Primary Care Provider: [name] Other Clinician: Referring Provider: Referral, Self Treating Provider/Extender: Altamese  in Treatment: 0 Information Obtained From Patient Ear/Nose/Mouth/Throat Complaints and Symptoms: Negative for: Difficult clearing ears; Sinusitis Hematologic/Lymphatic Complaints and Symptoms: Negative for: Bleeding / Clotting Disorders; Human Immunodeficiency Virus Gastrointestinal Complaints and Symptoms: Negative for: Frequent diarrhea; Nausea; Vomiting Medical  History: Negative for: Cirrhosis ; Colitis; Crohnos; Hepatitis A; Hepatitis B; Hepatitis C Past Medical History Notes: hx GI bleed Endocrine Complaints and Symptoms: Negative for: Hepatitis; Thyroid disease; Polydypsia (Excessive Thirst)  Genitourinary Complaints and Symptoms: Negative for: Kidney failure/ Dialysis; Incontinence/dribbling Immunological Complaints and Symptoms: Negative for: Hives; Itching Integumentary (Skin) Complaints and Symptoms: Negative for: Wounds; Bleeding or bruising tendency; Breakdown; Swelling Medical History: Negative for: History of Burn; History of pressure wounds Neurologic Complaints and Symptoms: Negative for: Numbness/parasthesias; Focal/Weakness Psychiatric Complaints and Symptoms: Negative for: Anxiety; Claustrophobia Eyes Medical History: Positive for: Cataracts Brackins, Rodolph A. ([id number]) Negative for: Glaucoma; Optic Neuritis Respiratory Medical History: Positive for: Sleep Apnea Negative for: Aspiration; Asthma; Chronic Obstructive Pulmonary Disease (COPD); Pneumothorax; Tuberculosis Cardiovascular Medical History: Positive for: Congestive Heart Failure; Coronary Artery Disease; Hypertension Negative for: Angina; Arrhythmia; Deep Vein Thrombosis; Hypotension; Myocardial Infarction; Peripheral Arterial Disease; Peripheral Venous Disease; Phlebitis; Vasculitis Past Medical History Notes: bradycardia, aortic valve replaced 2002, carotid bruits Musculoskeletal Medical History: Positive for: Osteoarthritis Negative for: Gout; Rheumatoid Arthritis; Osteomyelitis Oncologic HBO Extended History Items Eyes: Cataracts Immunizations Pneumococcal Vaccine: Received Pneumococcal Vaccination: Yes Immunization Notes: up to date Implantable Devices Yes Family and Social History Cancer: No; Diabetes: Yes - Mother; Heart Disease: Yes - Mother,Father; Hereditary Spherocytosis: No; Hypertension: Yes - Mother,Father; Kidney Disease: No;  Lung Disease: No; Seizures: No; Stroke: Yes - Father,Siblings; Thyroid Problems: No; Tuberculosis: No; Former smoker - 1975; Marital Status - Married; Alcohol Use: Moderate; Drug Use: No History; Caffeine Use: Daily; Financial Concerns: No; Food, Clothing or Shelter Needs: No; Support System Lacking: No; Transportation Concerns: No Electronic Signature(s) Signed: 09/06/2020 10:44:22 AM By: Rodell PernaScott, Dajea Signed: 09/06/2020 4:31:41 PM By: Baltazar Najjarobson, Terell Kincy MD Entered By: Rodell PernaScott, Dajea on 09/06/2020 09:55:10 [name], Dmario A. ([id number]) -------------------------------------------------------------------------------- SuperBill Details Patient Name: [name], Damani A. Date of Service: 09/06/2020 Medical Record Number: [id number] Patient Account Number: [id number] Date of Birth/Sex: 11/23/1931 [age] y.o. M) Treating RN: Huel CoventryWoody, [name] Primary Care Provider: Marcelino DusterJohnston, John Other Clinician: Referring Provider: Referral, Self Treating Provider/Extender: Altamese CarolinaOBSON, Sherrise Liberto G Weeks in Treatment: 0 Diagnosis Coding ICD-10 Codes Code Description I87.323 Chronic venous hypertension (idiopathic) with inflammation of bilateral lower extremity I89.0 Lymphedema, not elsewhere classified I50.32 Chronic diastolic (congestive) heart failure Facility Procedures CPT4 Code: [id number] Description: 99213 - WOUND CARE VISIT-LEV 3 EST PT Modifier: Quantity: 1 Physician Procedures CPT4 Code: 25956386770424 Description: 99214 - WC PHYS LEVEL 4 - EST PT Modifier: Quantity: 1 CPT4 Code: Description: ICD-10 Diagnosis Description I87.323 Chronic venous hypertension (idiopathic) with inflammation of bilateral l I89.0 Lymphedema, not elsewhere classified I50.32 Chronic diastolic (congestive) heart failure Modifier: ower extremity Quantity: Electronic Signature(s) Signed: 09/06/2020 4:31:41 PM By: Baltazar Najjarobson, Emaya Preston MD Entered By: Baltazar Najjarobson, Keili Hasten on 09/06/2020 10:40:23

## 2020-09-13 ENCOUNTER — Ambulatory Visit (INDEPENDENT_AMBULATORY_CARE_PROVIDER_SITE_OTHER): Payer: Medicare Other | Admitting: Family

## 2020-09-13 ENCOUNTER — Inpatient Hospital Stay
Admission: EM | Admit: 2020-09-13 | Discharge: 2020-09-26 | DRG: 377 | Disposition: A | Discharge status: Skilled Nursing Facility | Payer: Medicare Other | Source: Ambulatory Visit | Attending: Internal Medicine | Admitting: Internal Medicine

## 2020-09-13 ENCOUNTER — Other Ambulatory Visit: Payer: Self-pay

## 2020-09-13 ENCOUNTER — Other Ambulatory Visit
Admission: RE | Admit: 2020-09-13 | Discharge: 2020-09-13 | Disposition: A | Discharge status: Home / Self Care | Payer: Medicare Other | Source: Home / Self Care | Attending: Family | Admitting: Family

## 2020-09-13 ENCOUNTER — Ambulatory Visit (INDEPENDENT_AMBULATORY_CARE_PROVIDER_SITE_OTHER): Payer: Medicare Other

## 2020-09-13 ENCOUNTER — Emergency Department: Payer: Medicare Other

## 2020-09-13 ENCOUNTER — Telehealth: Payer: Self-pay | Admitting: Family

## 2020-09-13 ENCOUNTER — Encounter: Payer: Self-pay | Admitting: Family

## 2020-09-13 VITALS — BP 80/44 | HR 54 | Ht 71.0 in | Wt 202.2 lb

## 2020-09-13 DIAGNOSIS — Z7901 Long term (current) use of anticoagulants: Secondary | ICD-10-CM

## 2020-09-13 DIAGNOSIS — Z882 Allergy status to sulfonamides status: Secondary | ICD-10-CM

## 2020-09-13 DIAGNOSIS — I482 Chronic atrial fibrillation, unspecified: Secondary | ICD-10-CM | POA: Diagnosis not present

## 2020-09-13 DIAGNOSIS — K922 Gastrointestinal hemorrhage, unspecified: Secondary | ICD-10-CM

## 2020-09-13 DIAGNOSIS — I5033 Acute on chronic diastolic (congestive) heart failure: Secondary | ICD-10-CM | POA: Diagnosis not present

## 2020-09-13 DIAGNOSIS — J9601 Acute respiratory failure with hypoxia: Secondary | ICD-10-CM | POA: Diagnosis not present

## 2020-09-13 DIAGNOSIS — Z515 Encounter for palliative care: Secondary | ICD-10-CM | POA: Diagnosis not present

## 2020-09-13 DIAGNOSIS — D62 Acute posthemorrhagic anemia: Secondary | ICD-10-CM | POA: Diagnosis present

## 2020-09-13 DIAGNOSIS — Z952 Presence of prosthetic heart valve: Secondary | ICD-10-CM

## 2020-09-13 DIAGNOSIS — Z87891 Personal history of nicotine dependence: Secondary | ICD-10-CM

## 2020-09-13 DIAGNOSIS — K219 Gastro-esophageal reflux disease without esophagitis: Secondary | ICD-10-CM | POA: Diagnosis present

## 2020-09-13 DIAGNOSIS — I4821 Permanent atrial fibrillation: Secondary | ICD-10-CM | POA: Diagnosis present

## 2020-09-13 DIAGNOSIS — I5032 Chronic diastolic (congestive) heart failure: Secondary | ICD-10-CM | POA: Diagnosis present

## 2020-09-13 DIAGNOSIS — R6521 Severe sepsis with septic shock: Secondary | ICD-10-CM | POA: Diagnosis not present

## 2020-09-13 DIAGNOSIS — Z6830 Body mass index (BMI) 30.0-30.9, adult: Secondary | ICD-10-CM

## 2020-09-13 DIAGNOSIS — Z96649 Presence of unspecified artificial hip joint: Secondary | ICD-10-CM | POA: Diagnosis present

## 2020-09-13 DIAGNOSIS — A419 Sepsis, unspecified organism: Secondary | ICD-10-CM | POA: Diagnosis not present

## 2020-09-13 DIAGNOSIS — K2981 Duodenitis with bleeding: Principal | ICD-10-CM | POA: Diagnosis present

## 2020-09-13 DIAGNOSIS — J9621 Acute and chronic respiratory failure with hypoxia: Secondary | ICD-10-CM | POA: Diagnosis not present

## 2020-09-13 DIAGNOSIS — G9341 Metabolic encephalopathy: Secondary | ICD-10-CM | POA: Diagnosis not present

## 2020-09-13 DIAGNOSIS — Z8711 Personal history of peptic ulcer disease: Secondary | ICD-10-CM

## 2020-09-13 DIAGNOSIS — I2510 Atherosclerotic heart disease of native coronary artery without angina pectoris: Secondary | ICD-10-CM

## 2020-09-13 DIAGNOSIS — M259 Joint disorder, unspecified: Secondary | ICD-10-CM | POA: Diagnosis present

## 2020-09-13 DIAGNOSIS — E876 Hypokalemia: Secondary | ICD-10-CM | POA: Diagnosis not present

## 2020-09-13 DIAGNOSIS — J690 Pneumonitis due to inhalation of food and vomit: Secondary | ICD-10-CM | POA: Diagnosis not present

## 2020-09-13 DIAGNOSIS — N179 Acute kidney failure, unspecified: Secondary | ICD-10-CM | POA: Diagnosis present

## 2020-09-13 DIAGNOSIS — I10 Essential (primary) hypertension: Secondary | ICD-10-CM

## 2020-09-13 DIAGNOSIS — Z823 Family history of stroke: Secondary | ICD-10-CM

## 2020-09-13 DIAGNOSIS — Z8249 Family history of ischemic heart disease and other diseases of the circulatory system: Secondary | ICD-10-CM

## 2020-09-13 DIAGNOSIS — R791 Abnormal coagulation profile: Secondary | ICD-10-CM | POA: Diagnosis present

## 2020-09-13 DIAGNOSIS — I350 Nonrheumatic aortic (valve) stenosis: Secondary | ICD-10-CM | POA: Diagnosis not present

## 2020-09-13 DIAGNOSIS — H9190 Unspecified hearing loss, unspecified ear: Secondary | ICD-10-CM | POA: Diagnosis present

## 2020-09-13 DIAGNOSIS — R0602 Shortness of breath: Secondary | ICD-10-CM

## 2020-09-13 DIAGNOSIS — Z20822 Contact with and (suspected) exposure to covid-19: Secondary | ICD-10-CM | POA: Diagnosis present

## 2020-09-13 DIAGNOSIS — K269 Duodenal ulcer, unspecified as acute or chronic, without hemorrhage or perforation: Secondary | ICD-10-CM | POA: Diagnosis present

## 2020-09-13 DIAGNOSIS — Z8719 Personal history of other diseases of the digestive system: Secondary | ICD-10-CM

## 2020-09-13 DIAGNOSIS — Z881 Allergy status to other antibiotic agents status: Secondary | ICD-10-CM | POA: Diagnosis not present

## 2020-09-13 DIAGNOSIS — I361 Nonrheumatic tricuspid (valve) insufficiency: Secondary | ICD-10-CM | POA: Diagnosis not present

## 2020-09-13 DIAGNOSIS — D649 Anemia, unspecified: Secondary | ICD-10-CM | POA: Diagnosis present

## 2020-09-13 DIAGNOSIS — Z833 Family history of diabetes mellitus: Secondary | ICD-10-CM

## 2020-09-13 DIAGNOSIS — R509 Fever, unspecified: Secondary | ICD-10-CM | POA: Diagnosis not present

## 2020-09-13 DIAGNOSIS — F32A Depression, unspecified: Secondary | ICD-10-CM | POA: Diagnosis present

## 2020-09-13 DIAGNOSIS — R8281 Pyuria: Secondary | ICD-10-CM | POA: Diagnosis not present

## 2020-09-13 DIAGNOSIS — I110 Hypertensive heart disease with heart failure: Secondary | ICD-10-CM | POA: Diagnosis present

## 2020-09-13 DIAGNOSIS — R195 Other fecal abnormalities: Secondary | ICD-10-CM | POA: Diagnosis not present

## 2020-09-13 DIAGNOSIS — K921 Melena: Secondary | ICD-10-CM | POA: Diagnosis not present

## 2020-09-13 DIAGNOSIS — E669 Obesity, unspecified: Secondary | ICD-10-CM | POA: Diagnosis present

## 2020-09-13 DIAGNOSIS — M25511 Pain in right shoulder: Secondary | ICD-10-CM | POA: Diagnosis not present

## 2020-09-13 DIAGNOSIS — F410 Panic disorder [episodic paroxysmal anxiety] without agoraphobia: Secondary | ICD-10-CM | POA: Diagnosis present

## 2020-09-13 DIAGNOSIS — Z66 Do not resuscitate: Secondary | ICD-10-CM | POA: Diagnosis not present

## 2020-09-13 DIAGNOSIS — Z79899 Other long term (current) drug therapy: Secondary | ICD-10-CM

## 2020-09-13 DIAGNOSIS — I340 Nonrheumatic mitral (valve) insufficiency: Secondary | ICD-10-CM | POA: Diagnosis not present

## 2020-09-13 DIAGNOSIS — Z7189 Other specified counseling: Secondary | ICD-10-CM | POA: Diagnosis not present

## 2020-09-13 DIAGNOSIS — I952 Hypotension due to drugs: Secondary | ICD-10-CM

## 2020-09-13 DIAGNOSIS — G2581 Restless legs syndrome: Secondary | ICD-10-CM | POA: Diagnosis present

## 2020-09-13 DIAGNOSIS — I878 Other specified disorders of veins: Secondary | ICD-10-CM | POA: Diagnosis present

## 2020-09-13 DIAGNOSIS — Z5181 Encounter for therapeutic drug level monitoring: Secondary | ICD-10-CM

## 2020-09-13 DIAGNOSIS — R0902 Hypoxemia: Secondary | ICD-10-CM

## 2020-09-13 LAB — RESPIRATORY PANEL BY RT PCR (FLU A&B, COVID)
Influenza A by PCR: NEGATIVE
Influenza B by PCR: NEGATIVE
SARS Coronavirus 2 by RT PCR: NEGATIVE

## 2020-09-13 LAB — CBC
HCT: 24.1 % — ABNORMAL LOW (ref 39.0–52.0)
HCT: 25.8 % — ABNORMAL LOW (ref 39.0–52.0)
Hemoglobin: 7.7 g/dL — ABNORMAL LOW (ref 13.0–17.0)
Hemoglobin: 8.3 g/dL — ABNORMAL LOW (ref 13.0–17.0)
MCH: 28.8 pg (ref 26.0–34.0)
MCH: 28.9 pg (ref 26.0–34.0)
MCHC: 32 g/dL (ref 30.0–36.0)
MCHC: 32.2 g/dL (ref 30.0–36.0)
MCV: 90.3 fL (ref 80.0–100.0)
MCV: 89.9 fL (ref 80.0–100.0)
Platelets: 191 10*3/uL (ref 150–400)
Platelets: 216 10*3/uL (ref 150–400)
RBC: 2.67 MIL/uL — ABNORMAL LOW (ref 4.22–5.81)
RBC: 2.87 MIL/uL — ABNORMAL LOW (ref 4.22–5.81)
RDW: 16.7 % — ABNORMAL HIGH (ref 11.5–15.5)
RDW: 16.6 % — ABNORMAL HIGH (ref 11.5–15.5)
WBC: 7.5 10*3/uL (ref 4.0–10.5)
WBC: 6.9 10*3/uL (ref 4.0–10.5)
nRBC: 0 % (ref 0.0–0.2)
nRBC: 0 % (ref 0.0–0.2)

## 2020-09-13 LAB — URINALYSIS, COMPLETE (UACMP) WITH MICROSCOPIC
Bacteria, UA: NONE SEEN
Bilirubin Urine: NEGATIVE
Glucose, UA: NEGATIVE mg/dL
Hgb urine dipstick: NEGATIVE
Ketones, ur: NEGATIVE mg/dL
Leukocytes,Ua: NEGATIVE
Nitrite: NEGATIVE
Protein, ur: NEGATIVE mg/dL
Specific Gravity, Urine: 1.008 (ref 1.005–1.030)
pH: 6 (ref 5.0–8.0)

## 2020-09-13 LAB — BASIC METABOLIC PANEL
Anion gap: 11 (ref 5–15)
BUN: 49 mg/dL — ABNORMAL HIGH (ref 8–23)
CO2: 30 mmol/L (ref 22–32)
Calcium: 8.6 mg/dL — ABNORMAL LOW (ref 8.9–10.3)
Chloride: 90 mmol/L — ABNORMAL LOW (ref 98–111)
Creatinine, Ser: 1.67 mg/dL — ABNORMAL HIGH (ref 0.61–1.24)
GFR, Estimated: 36 mL/min — ABNORMAL LOW (ref >60)
Glucose, Bld: 122 mg/dL — ABNORMAL HIGH (ref 70–99)
Potassium: 3.6 mmol/L (ref 3.5–5.1)
Sodium: 131 mmol/L — ABNORMAL LOW (ref 135–145)

## 2020-09-13 LAB — RETICULOCYTES
Immature Retic Fract: 17.4 % — ABNORMAL HIGH (ref 2.3–15.9)
RBC.: 2.64 MIL/uL — ABNORMAL LOW (ref 4.22–5.81)
Retic Count, Absolute: 45.7 10*3/uL (ref 19.0–186.0)
Retic Ct Pct: 1.7 % (ref 0.4–3.1)

## 2020-09-13 LAB — IRON AND TIBC
Iron: 16 ug/dL — ABNORMAL LOW (ref 45–182)
Saturation Ratios: 4 % — ABNORMAL LOW (ref 17.9–39.5)
TIBC: 442 ug/dL (ref 250–450)
UIBC: 426 ug/dL

## 2020-09-13 LAB — COMPREHENSIVE METABOLIC PANEL
ALT: 10 U/L (ref 0–44)
AST: 20 U/L (ref 15–41)
Albumin: 3.5 g/dL (ref 3.5–5.0)
Alkaline Phosphatase: 108 U/L (ref 38–126)
Anion gap: 11 (ref 5–15)
BUN: 48 mg/dL — ABNORMAL HIGH (ref 8–23)
CO2: 30 mmol/L (ref 22–32)
Calcium: 8.7 mg/dL — ABNORMAL LOW (ref 8.9–10.3)
Chloride: 90 mmol/L — ABNORMAL LOW (ref 98–111)
Creatinine, Ser: 1.66 mg/dL — ABNORMAL HIGH (ref 0.61–1.24)
GFR, Estimated: 37 mL/min — ABNORMAL LOW (ref >60)
Glucose, Bld: 101 mg/dL — ABNORMAL HIGH (ref 70–99)
Potassium: 3.8 mmol/L (ref 3.5–5.1)
Sodium: 131 mmol/L — ABNORMAL LOW (ref 135–145)
Total Bilirubin: 0.8 mg/dL (ref 0.3–1.2)
Total Protein: 6.1 g/dL — ABNORMAL LOW (ref 6.5–8.1)

## 2020-09-13 LAB — BRAIN NATRIURETIC PEPTIDE: B Natriuretic Peptide: 379.3 pg/mL — ABNORMAL HIGH (ref 0.0–100.0)

## 2020-09-13 LAB — POCT INR: INR: 1.4 — AB (ref 2.0–3.0)

## 2020-09-13 LAB — HEMOGLOBIN AND HEMATOCRIT, BLOOD
HCT: 23.7 % — ABNORMAL LOW (ref 39.0–52.0)
Hemoglobin: 7.7 g/dL — ABNORMAL LOW (ref 13.0–17.0)

## 2020-09-13 LAB — FERRITIN: Ferritin: 42 ng/mL (ref 24–336)

## 2020-09-13 LAB — PREPARE RBC (CROSSMATCH)

## 2020-09-13 LAB — FOLATE: Folate: 14.8 ng/mL (ref >5.9)

## 2020-09-13 MED ORDER — ROPINIROLE HCL 1 MG PO TABS
2.0000 mg | ORAL_TABLET | Freq: Four times a day (QID) | ORAL | Status: DC
  Administered 2020-09-13 – 2020-09-14 (×3): 2 mg via ORAL
  Filled 2020-09-13 (×7): qty 2

## 2020-09-13 MED ORDER — ACETAMINOPHEN 325 MG PO TABS
650.0000 mg | ORAL_TABLET | Freq: Four times a day (QID) | ORAL | Status: DC | PRN
  Administered 2020-09-13 – 2020-09-15 (×2): 650 mg via ORAL
  Filled 2020-09-13 (×3): qty 2

## 2020-09-13 MED ORDER — ONDANSETRON HCL 4 MG/2ML IJ SOLN
4.0000 mg | Freq: Four times a day (QID) | INTRAMUSCULAR | Status: DC | PRN
  Administered 2020-09-22: 4 mg via INTRAVENOUS
  Filled 2020-09-13: qty 2

## 2020-09-13 MED ORDER — SODIUM CHLORIDE 0.9 % IV BOLUS
500.0000 mL | Freq: Once | INTRAVENOUS | Status: AC
  Administered 2020-09-13: 500 mL via INTRAVENOUS

## 2020-09-13 MED ORDER — SODIUM CHLORIDE 0.9 % IV SOLN
10.0000 mL/h | Freq: Once | INTRAVENOUS | Status: AC
  Administered 2020-09-13: 10 mL/h via INTRAVENOUS

## 2020-09-13 MED ORDER — ACETAMINOPHEN 650 MG RE SUPP: 650.0000 mg | Freq: Four times a day (QID) | RECTAL | Status: DC | PRN

## 2020-09-13 MED ORDER — ONDANSETRON HCL 4 MG PO TABS: 4.0000 mg | ORAL_TABLET | Freq: Four times a day (QID) | ORAL | Status: DC | PRN

## 2020-09-13 MED ORDER — SODIUM CHLORIDE 0.9 % IV SOLN: INTRAVENOUS | Status: DC

## 2020-09-13 MED ORDER — MORPHINE SULFATE (PF) 2 MG/ML IV SOLN: 2.0000 mg | INTRAVENOUS | Status: DC | PRN

## 2020-09-13 MED ORDER — TRAZODONE HCL 50 MG PO TABS
50.0000 mg | ORAL_TABLET | Freq: Every day | ORAL | Status: DC
  Administered 2020-09-13 – 2020-09-25 (×12): 50 mg via ORAL
  Filled 2020-09-13 (×12): qty 1

## 2020-09-13 NOTE — Progress Notes (Signed)
Office Visit    Patient Name: [name] Date of Encounter: 09/13/2020  Primary Care Provider:  Gracelyn NurseJohnston, [name], MD Primary Cardiologist:  Julien Nordmannimothy Gollan, MD Electrophysiologist:  None   Chief Complaint    [name] is a 86 y.o. male with a hx of permanent atrial fibrillation, aortic stenosis s/p mechanical AVR on chronic Coumadin, chronic diastolic heart failure, nonobstructive coronary disease, HTN, HLD, GERD, chronic dyspnea, chronic venous stasis presents today for follow up of HFrEF.   Past Medical History    Past Medical History:  Diagnosis Date  . Anxiety 10/11  . Aortic stenosis    a. s/p mechcanical AVR, 2002; b. 10/2018 Echo: Triv AI, mean grad 9mmHg.  . Bradycardia    chronic, no symptoms 07/2010  . C. difficile colitis   . Carotid bruit    dopplers in past, no abnormalities  . Chronic diastolic CHF (congestive heart failure) (HCC)    a. Echo 03/2015: EF 60-65%, no RWMA, GR1DD, mild BAE, mild to mod MR, mod TR, PASP 65 mmHg; b. 01/2017 Echo: EF 55-60%, NRWMA, grade 1 diastolic dysfunction.  Normal functioning prosthetic aortic valve.  Mean gradient 50 mmHg.  Sev TR. PASP 54mmHg; c. 10/2018 Echo: EF 55-60%, Triv AI, mod dil LA, mod-sev TR, PASP 35-40, mild to mod red RV fxn.  . Coronary artery disease    a. mild, cath, 08/2010; b. medically managed  . Decreased hearing    Right ear  . Depression   . Gastric ulcer   . GERD (gastroesophageal reflux disease)   . Hypertension    BP higher than usual 04/19/10; amlodipine increased by telephone  . Mod-Sev Tricuspid regurgitation    a. 10/2018 Echo: Mod-Sev TR, PASP 35-2540mmHg.  Marland Kitchen. RLS (restless legs syndrome) 08/23/2015  . S/P AVR    a. [employer]. mechanical 2002; b. echo 08/2010 EF 60%, trival AI, mild MR, AVR working well; c. on longterm warfarin tx  . SOB (shortness of breath) 10/11   08/2010,Episodes at 5 AM, eventually felt to be anxiety, after complete workup including catheterization, pt  greatly improved with anxiety meds 11/11   Past Surgical History:  Procedure Laterality Date  . CARDIAC CATHETERIZATION    . COLONOSCOPY WITH PROPOFOL N/A 08/03/2020   Procedure: COLONOSCOPY WITH PROPOFOL;  Surgeon: Toledo, Boykin Nearingeodoro K, MD;  Location: [facility] ENDOSCOPY;  Service: Gastroenterology;  Laterality: N/A;  . ESOPHAGOGASTRODUODENOSCOPY N/A 04/05/2015   Procedure: ESOPHAGOGASTRODUODENOSCOPY (EGD);  Surgeon: [name], MD;  [facility];  Service: Endoscopy;  Laterality: N/A;  . ESOPHAGOGASTRODUODENOSCOPY N/A 04/17/2015   Procedure: ESOPHAGOGASTRODUODENOSCOPY (EGD);  Surgeon: [name], MD;  [facility];  Service: Endoscopy;  Laterality: N/A;  . ESOPHAGOGASTRODUODENOSCOPY N/A 08/02/2015   Procedure: ESOPHAGOGASTRODUODENOSCOPY (EGD);  Surgeon: [name], MD;  [facility];  Service: Endoscopy;  Laterality: N/A;  . ESOPHAGOGASTRODUODENOSCOPY N/A 07/30/2020   Procedure: ESOPHAGOGASTRODUODENOSCOPY (EGD);  Surgeon: Toledo, Boykin Nearingeodoro K, MD;  Location: [facility] ENDOSCOPY;  Service: Gastroenterology;  Laterality: N/A;  . HERNIA REPAIR    . JOINT REPLACEMENT    . TOTAL HIP ARTHROPLASTY    . VALVE REPLACEMENT  1/02   Aortic; echo 3/09 valve working well; echo 10/11 working well; put on Coumadin    Allergies  Allergies  Allergen Reactions  . Sulfa Antibiotics Other (See Comments)    Reaction:  Unknown   . Levofloxacin Nausea Only    History of Present Illness    [name] is a 86 y.o. male  with a hx of  permanent atrial fibrillation, aortic stenosis s/p mechanical AVR on chronic Coumadin, chronic diastolic heart failure, nonobstructive coronary disease, HTN, HLD, GERD, chronic dyspnea, chronic venous stasis  last seen 08/21/20.  Followed closely for diastolic heart failure. 07/2019 Torsemide adjusted to 40mg  BID and Metolazone increased to 3x/week. 09/2019 weight down to 206 lb. 12/2019 he was utilizing Metolazone only once per week with weight  195 lb on home scale. He was recommended to utilize Metolazone 2x/week. Seen 01/2020 with stable weight and diuretic regimen continued.   July 2021 noted weight up to 205lbs and weeping to lower extremities. He was instructed to take Metolazone 3 days in a row with Torsemide 40mg  BID. Repeat BMP with elevated creatinine and diuretics held for 2 days. Seen in follow up 06/23/20 with continued LE edema and abdominal distention. Evaluated in INR for supratherapeutic INR but volume not addressed. CXR 06/26/20 mild central pulmonary vascular congestion with bibasilar pulmonary edema. Seen in clinic 07/03/20 and sent to short stay for 40mg  IV Lasix.   Admitted 07/10/2020 -07/14/2020 after being seen by wound care team and recommended to go to the ED for worsening erythema suspicious for cellulitis.  He was treated with IV vancomycin with resolution of cellulitis.  He was admitted to the hospital 07/29/2020-08/10/20 work-up revealed hemoglobin 4, INR greater than 10, rectal exam with positive heme stools.  Treated with IV vitamin K, Kcentra, 2 units of FFP as well as IV Protonix.  EGD 07/30/2020 with large hiatal hernia but no evidence of bleeding.  Colonoscopy 08/03/2020 with nonbleeding internal hemorrhoids, diverticulosis in sigmoid colon descending colon, stool in entire examined colon and blood in sigmoid colon.  Echo 08/06/2020 EF 60 to 65%, no  RWMA, RV mildly enlarged, mildly elevated PASP, mild MR, moderate TR, mechanical aortic valve with mean gradient 9 mmHg.  EEG due to acute metabolic encephalopathy 08/07/2020 suggestive of mild diffuse encephalopathy, no seizures.  He was treated with IV Lasix for acute on chronic diastolic heart failure.  His clonidine and doxazosin were discontinued.  He was discharged to SNF.  Seen in follow up 08/21/20. Lab work collected at that visit with Hb 10.6 and normal renal function. His sodium was mildly low and recommended for strict <2L PO fluid intake. His BNP was mildly elevated  (202.5) though improved compared to previous. Repeat lab work 09/01/20 with K 2.8, Na 131, creatinine 1.12, GFR 59.   Presents today with his daughter for follow-up.  He moved from 08/09/2020 of [facility] back to his apartment on Thursday.  He felt well for a few days and notices over the weekend he has had increased fatigue, swelling of his legs, somnolence.  Reports eating 3 meals per day.  Denies bleeding such as melena or hematuria.  He has been taking torsemide 40 mg twice daily since leaving SNF though it is only prescribed once daily.  He additionally thinks he may have resumed his doxazosin or clonidine which were both discontinued during recent hospitalization in early September.  Long discussion regarding his medications.  Daughter tells me she will assist him to remove his bottles as he still manages his own medications.  EKGs/Labs/Other Studies Reviewed:   The following studies were reviewed today:  Echo 08/06/20 1. Left ventricular ejection fraction, by estimation, is 60 to 65%. The  left ventricle has normal function. The left ventricle has no regional  wall motion abnormalities. Left ventricular diastolic parameters are  indeterminate.   2. Right ventricular systolic function  is normal. The right ventricular  size is mildly enlarged. There is mildly elevated pulmonary artery  systolic pressure. The estimated right ventricular systolic pressure is  39.2 mmHg.   3. Mild mitral valve regurgitation.   4. Tricuspid valve regurgitation is moderate.   5. The aortic valve was not well visualized. Mechanical aortic valve. No  aortic stenosis is present. Aortic valve mean gradient measures 9.0 mmHg.   6. Challenging images   EKG: EKG is ordered today.  EKG performed today demonstrates atrial fibrillation with slow ventricular response rate 54 bpm with prolonged QT (QT 528/QTC 500 ms).  No acute ST/T wave changes.  Recent Labs: 08/05/2020: ALT 21 08/10/2020: Magnesium 2.3 08/21/2020: BNP  202.5; Hemoglobin 10.6; Platelets 303 09/01/2020: BUN 28; Creatinine, Ser 1.12; Potassium 2.8; Sodium 131  Recent Lipid Panel No results found for: CHOL, TRIG, HDL, CHOLHDL, VLDL, LDLCALC, LDLDIRECT  Home Medications   Current Meds  Medication Sig  . Acetaminophen 500 MG capsule Take 2 capsules by mouth every 8 (eight) hours as needed.  Marland Kitchen allopurinol (ZYLOPRIM) 300 MG tablet Take 300 mg by mouth daily.   Marland Kitchen amoxicillin (AMOXIL) 500 MG capsule 4 CAPSULES PRIOR TO DENTAL PROCEDURES AS DIRECTED  . Cetirizine HCl 10 MG CAPS Take 10 mg by mouth daily.  . finasteride (PROSCAR) 5 MG tablet Take 5 mg by mouth daily.    . isosorbide mononitrate (IMDUR) 60 MG 24 hr tablet Take 1 tablet (60 mg total) by mouth daily.  . metolazone (ZAROXOLYN) 2.5 MG tablet Take 1 tablet (2.5 mg total) by mouth 2 (two) times a week.  . metoprolol succinate (TOPROL-XL) 25 MG 24 hr tablet Take 0.5 tablets (12.5 mg total) by mouth daily.  . pantoprazole (PROTONIX) 40 MG tablet Take 40 mg by mouth daily.  . potassium chloride (KLOR-CON) 10 MEQ tablet TAKE 3 TABLETS (30 MEQ) BY MOUTH DAILY. TAKE AN ADDITIONAL 2TABS ON THE DAYS YOU TAKE METOLAZONE  . rOPINIRole (REQUIP) 2 MG tablet TAKE 1 TABLET BY MOUTH 4  TIMES DAILY (Patient taking differently: Take 2 mg by mouth in the morning, at noon, in the evening, and at bedtime. Take 1 tablet by mouth 4  times daily)  . torsemide (DEMADEX) 20 MG tablet Take 2 tablets (40 mg total) by mouth daily. Take additional 40 mg for 3 pounds weight gain .  . traZODone (DESYREL) 50 MG tablet Take 50 mg by mouth at bedtime.  Marland Kitchen warfarin (COUMADIN) 2.5 MG tablet Take 1 tablet (2.5 mg total) by mouth daily at 4 PM. Takes daily at 1700    Review of Systems  Review of Systems  Constitutional: Positive for malaise/fatigue. Negative for chills and fever.  Cardiovascular: Positive for leg swelling. Negative for chest pain, dyspnea on exertion, irregular heartbeat, near-syncope, orthopnea, palpitations  and syncope.  Respiratory: Negative for cough, shortness of breath and wheezing.   Gastrointestinal: Negative for melena, nausea and vomiting.  Genitourinary: Negative for hematuria.  Neurological: Positive for light-headedness and weakness. Negative for dizziness.   All other systems reviewed and are otherwise negative except as noted above.  Physical Exam    VS:  BP (!) 80/44 (BP Location: Left Arm, Patient Position: Sitting, Cuff Size: Normal)   Pulse (!) 54   Ht 5\' 11"  (1.803 m)   Wt 202 lb 4 oz (91.7 kg)   SpO2 98%   BMI 28.21 kg/m  , BMI Body mass index is 28.21 kg/m. GEN: Well nourished, well developed, in no acute distress. HEENT: normal. Neck:  Supple, no JVD, carotid bruits, or masses. Cardiac: Irregularly irregular, bradycardic, no murmurs, rubs, or gallops. No clubbing, cyanosis, edema.  Radials/DP/PT 2+ and equal bilaterally.  Respiratory:  Respirations regular and unlabored, clear to auscultation bilaterally. GI: Soft, nontender, nondistended. MS: No deformity or atrophy. Skin: Warm and dry, no rash.  Pale appearing.  Bilateral lower extremities with compression stockings in place.  Nonpitting edema. Neuro:  Strength and sensation are intact.  Somnolent Psych: Normal affect.    Assessment & Plan    1. Chronic diastolic heart failure - Echo 6/46/80 EF 60-65%. GDMT includes Toprol, Torsemide, Metolazone.  He is up 21 pounds from clinic visit 3 weeks ago.  However, likely slightly dry at that time.  Notices some increased lower extremity edema. LE non pitting on exam with compression stockings in place.  Reports no shortness of breath or dyspnea on exertion.  Hold Torsemide today and tomorrow and resume Friday at 40mg  daily. He will hold Metolazone Friday. Readdress diuretic doses based on lab work.   2. Hypokalemia -Labs ordered by provider at SNF with 09/01/20 K 2.8.  He is uncertain whether he was given extra potassium supplementation or whether recheck was performed.  Stat CMP today for reassessment.  3. Hyponatremia - Discussed important of 2L fluid restriction. 09/01/20 Na 131, no recheck performed. Stat CMP today.  We discussed that if his sodium is persistently low due to somnolence in clinic today will likely need evaluation in the ED.  4. CKDIII - Careful titration of diuretics and antihypertensives.  CMP today.  5. Anemia - Recent admission early 07/2020 with Hb 4 on admission. EGD and colonoscopy performed. Denies melena, hematuria. Hb 08/14/20 Hb 9.4.  Stat CBC today.  As he is pale appearing on exam and fatigued and concern for recurrent anemia.  6. HTN /hypotension-markedly hypotensive today BP 80/44 on initial check and recheck by myself.  Doxazosin and Clonidine discontinued during hospitalization early 07/2020.  Appears he has been taking his doxazosin at home which may be contributory to low BP.  Educated to remove doxazosin and clonidine from his medications.    We will discontinue Imdur 60 mg daily.  He was encouraged to drink lots of fluids today.  7. Nonobstructive CAD -  GDMT includes beta-blocker.  Imdur discontinue, as above. No aspirin secondary to chronic anticoagulation.  No indication for ischemic evaluation this time.  8. Permanent atrial fibrillation/chronic anticoagulation -Slow ventricular response on EKG today 54 bpm.  Discussed with Dr. 08/2020.  We will continue Toprol 12.5 mg daily as it is such a low dose.  He reports some lightheadedness that this is likely due to his markedly low BP, as above.  Following with Coumadin clinic in our office, INR subtherapeutic today, dose adjusted.  9. S/p AVR - Continue SBE prophylaxis. Continue warfarin, as above.   Disposition: He is pale on exam, bradycardic, hypotensive.  Discussed with Dr. Mariah Milling in clinic.  As the patient really wishes to defer repeat hospitalization and ED visit we will obtain stat labs including BNP, CBC , CMP.  If significant lab abnormalities he will require ED visit.   Follow up  in 1 week(s) with Dr. Mariah Milling or APP.    Mariah Milling, NP 09/13/2020, 11:34 AM

## 2020-09-13 NOTE — ED Notes (Signed)
Blood started at this time

## 2020-09-13 NOTE — ED Notes (Signed)
Patient has no complaints at this time. Denies pain.

## 2020-09-13 NOTE — ED Triage Notes (Signed)
Pt here with low hemoglobin low heart rate from his clinic. Pt states that he feels sleepy but otherwise has not issues. Pt NAD in triage.

## 2020-09-13 NOTE — Patient Instructions (Signed)
-   take 1.5 tablets today - tomorrow, START NEW DOSAGE of warfarin of 5 mg (1 tablet) every day - Recheck INR in 1 week

## 2020-09-13 NOTE — ED Provider Notes (Signed)
[facility] Emergency Department Provider Note    First MD Initiated Contact with Patient 09/13/20 1529     (approximate)  I have reviewed the triage vital signs and the nursing notes.   HISTORY  Chief Complaint Bradycardia and Hypotension    HPI [name] is a 86 y.o. male below listed past medical history presents to the ER due to concern for low hemoglobin.  Reportedly has been feeling very weak and tired.  Feels like he is about to faint with any exertion.  Denies any pain.  No melena or hematochezia.  Was just recently hospitalized for GI bleed with a hemoglobin of 3 requiring transfusion.  He is still on Coumadin for mechanical heart valve.    Past Medical History:  Diagnosis Date  . Anxiety 10/11  . Aortic stenosis    a. s/p mechcanical AVR, 2002; b. 10/2018 Echo: Triv AI, mean grad .  . Bradycardia    chronic, no symptoms 07/2010  . C. difficile colitis   . Carotid bruit    dopplers in past, no abnormalities  . Chronic diastolic CHF (congestive heart failure) (HCC)    a. Echo 03/2015: EF 60-65%, no RWMA, GR1DD, mild BAE, mild to mod MR, mod TR, PASP 65 mmHg; b. 01/2017 Echo: EF 55-60%, NRWMA, grade 1 diastolic dysfunction.  Normal functioning prosthetic aortic valve.  Mean gradient 50 mmHg.  Sev TR. PASP ; c. 10/2018 Echo: EF 55-60%, Triv AI, mod dil LA, mod-sev TR, PASP 35-40, mild to mod red RV fxn.  . Coronary artery disease    a. mild, cath, 08/2010; b. medically managed  . Decreased hearing    Right ear  . Depression   . Gastric ulcer   . GERD (gastroesophageal reflux disease)   . Hypertension    BP higher than usual 04/19/10; amlodipine increased by telephone  . Mod-Sev Tricuspid regurgitation    a. 10/2018 Echo: Mod-Sev TR, PASP 35-90mmHg.  Marland Kitchen RLS (restless legs syndrome) 08/23/2015  . S/P AVR    a. [employer]. mechanical 2002; b. echo 08/2010 EF 60%, trival AI, mild MR, AVR working well; c. on longterm warfarin tx   . SOB (shortness of breath) 10/11   08/2010,Episodes at 5 AM, eventually felt to be anxiety, after complete workup including catheterization, pt greatly improved with anxiety meds 11/11   Family History  Problem Relation Age of Onset  . Hypertension Mother   . Diabetes type II Mother   . Heart disease Mother   . Heart attack Mother   . Hypertension Father   . Heart disease Father   . Stroke Father   . Stroke Brother   . Stroke Brother   . Hypertension Other    Past Surgical History:  Procedure Laterality Date  . CARDIAC CATHETERIZATION    . COLONOSCOPY WITH PROPOFOL N/A 08/03/2020   Procedure: COLONOSCOPY WITH PROPOFOL;  Surgeon: Toledo, Boykin Nearing, MD;  Location: [facility] ENDOSCOPY;  Service: Gastroenterology;  Laterality: N/A;  . ESOPHAGOGASTRODUODENOSCOPY N/A 04/05/2015   Procedure: ESOPHAGOGASTRODUODENOSCOPY (EGD);  Surgeon: Scot Jun, MD;  Location: [facility] ENDOSCOPY;  Service: Endoscopy;  Laterality: N/A;  . ESOPHAGOGASTRODUODENOSCOPY N/A 04/17/2015   Procedure: ESOPHAGOGASTRODUODENOSCOPY (EGD);  Surgeon: Scot Jun, MD;  Location: [facility] ENDOSCOPY;  Service: Endoscopy;  Laterality: N/A;  . ESOPHAGOGASTRODUODENOSCOPY N/A 08/02/2015   Procedure: ESOPHAGOGASTRODUODENOSCOPY (EGD);  Surgeon: Wallace Cullens, MD;  Location: [facility] ENDOSCOPY;  Service: Endoscopy;  Laterality: N/A;  . ESOPHAGOGASTRODUODENOSCOPY N/A 07/30/2020   Procedure: ESOPHAGOGASTRODUODENOSCOPY (EGD);  Surgeon: Toledo, Boykin Nearingeodoro K, MD;  Location: [facility] ENDOSCOPY;  Service: Gastroenterology;  Laterality: N/A;  . HERNIA REPAIR    . JOINT REPLACEMENT    . TOTAL HIP ARTHROPLASTY    . VALVE REPLACEMENT  1/02   Aortic; echo 3/09 valve working well; echo 10/11 working well; put on Coumadin   Patient Active Problem List   Diagnosis Date Noted  . History of lower GI bleeding 09/13/2020  . Symptomatic anemia 09/13/2020  . Malnutrition of moderate degree 08/08/2020  . Pressure injury of skin 08/02/2020  . Hemorrhagic shock (HCC)  07/30/2020  . Acute blood loss anemia 07/29/2020  . Lactic acidosis 07/29/2020  . AKI (acute kidney injury) (HCC) 07/11/2020  . Acute on chronic heart failure with preserved ejection fraction (HFpEF) (HCC)   . Permanent atrial fibrillation (HCC)   . Cellulitis 07/10/2020  . 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 Varicose veins of leg with swelling, right 02/08/2020  . 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 Greater trochanteric pain syndrome 11/02/2019  . Lower limb ulcer, ankle, left, limited to breakdown of skin (HCC) 11/02/2019  . H/O atrial flutter 07/15/2018  . Obstructive sleep apnea 10/23/2017  . Chronic diastolic CHF (congestive heart failure) (HCC) 09/24/2017  . Lymphedema 09/24/2017  . Chronic hyponatremia 08/12/2017  . Encounter for anticoagulation discussion and counseling 02/24/2017  . Bilateral leg edema 09/25/2015  . RLS (restless legs syndrome) 08/23/2015  . C. difficile colitis 05/26/2015  . Blood loss anemia   . History of mechanical aortic valve replacement   . Anemia 04/02/2015  . GERD (gastroesophageal reflux disease)   . Bradycardia   . Depression   . Anxiety   . Hypertension   . Decreased hearing   . Coronary artery disease   . Aortic stenosis   . S/P AVR   . Warfarin anticoagulation   . Carotid bruit       Prior to Admission medications   Medication Sig Start Date End Date Taking? Authorizing Provider  allopurinol (ZYLOPRIM) 300 MG tablet Take 300 mg by mouth daily.  05/16/20  Yes [provider]  Cetirizine HCl 10 MG CAPS Take 10 mg by mouth daily.   Yes [provider]  finasteride (PROSCAR) 5 MG tablet Take 5 mg by mouth daily.     Yes [provider]  metoprolol succinate (TOPROL-XL) 25 MG 24 hr tablet Take 0.5 tablets (12.5 mg total) by mouth daily. 03/06/20  Yes Gollan, Tollie Pizzaimothy J, MD  pantoprazole (PROTONIX) 40 MG tablet Take 40 mg by mouth daily.   Yes [provider]  potassium chloride (KLOR-CON) 10 MEQ tablet TAKE 3 TABLETS (30 MEQ) BY MOUTH DAILY. TAKE AN ADDITIONAL 2TABS ON THE DAYS YOU TAKE METOLAZONE 06/08/20  Yes Gollan, Tollie Pizzaimothy J, MD  rOPINIRole (REQUIP) 2 MG tablet TAKE 1 TABLET BY MOUTH 4  TIMES DAILY Patient taking differently: Take 2 mg by mouth in the morning, at noon, in the evening, and at bedtime. Take 1 tablet by mouth 4  times daily 09/14/18  Yes Millikan, Aundra MilletMegan, NP  torsemide (DEMADEX) 20 MG tablet Take 2 tablets (40 mg total) by mouth daily. Take additional 40 mg for 3 pounds weight gain . Patient taking differently: Take 40 mg by mouth 2 (two) times daily.  07/14/20  Yes Meredeth IdeLama, Gagan S, MD  traZODone (DESYREL) 50 MG tablet Take 50 mg by mouth at bedtime.   Yes [provider]  warfarin (COUMADIN) 2.5  MG tablet Take 1 tablet (2.5 mg total) by mouth daily at 4 PM. Takes daily at 1700 Patient taking differently: Take 2.5-3.75 mg by mouth daily. Monday, Tuesday, thursday,friday,saturday, sunday 3.75mg -Wednesday 08/11/20  Yes Lurene Shadow, MD  Acetaminophen 500 MG capsule Take 2 capsules by mouth every 8 (eight) hours as needed.    [provider]  busPIRone (BUSPAR) 5 MG tablet Take 1 tablet (5 mg total) by mouth 2 (two) times daily. Patient not taking: Reported on 09/13/2020 08/10/20   Lurene Shadow, MD  DULoxetine (CYMBALTA) 30 MG capsule Take 1 capsule (30 mg total) by mouth daily. Patient not taking: Reported on 09/13/2020 07/15/20   Meredeth Ide, MD  metolazone (ZAROXOLYN) 2.5 MG tablet Take 1 tablet (2.5 mg total) by mouth 2 (two) times a week. 02/17/20 09/13/20  Antonieta Iba, MD    Allergies Sulfa antibiotics and Levofloxacin    Social History Social History   Tobacco Use  . Smoking status: Former Smoker    Types: Cigarettes    Quit date: 1979    Years since quitting: 42.8  . Smokeless tobacco: Never Used  Vaping Use  . Vaping Use: Never used  Substance Use Topics  . Alcohol use: Yes     Alcohol/week: 3.0 standard drinks    Types: 3 Glasses of wine per week  . Drug use: No    Review of Systems Patient denies headaches, rhinorrhea, blurry vision, numbness, shortness of breath, chest pain, edema, cough, abdominal pain, nausea, vomiting, diarrhea, dysuria, fevers, rashes or hallucinations unless otherwise stated above in HPI. ____________________________________________   PHYSICAL EXAM:  VITAL SIGNS: Vitals:   09/13/20 1818 09/13/20 1838  BP: (!) 107/54 102/85  Pulse: 65 (!) 59  Resp: 18 18  Temp: (!) 97.3 F (36.3 C) (!) 97.3 F (36.3 C)  SpO2: 96% 98%    Constitutional: Alert and oriented.  Eyes: Conjunctivae are normal.  Head: Atraumatic. Nose: No congestion/rhinnorhea. Mouth/Throat: Mucous membranes are moist.   Neck: No stridor. Painless ROM.  Cardiovascular: Normal rate, regular rhythm. Grossly normal heart sounds.  Good peripheral circulation. Respiratory: Normal respiratory effort.  No retractions. Lungs CTAB. Gastrointestinal: Soft and nontender. No distention. No abdominal bruits. No CVA tenderness. Genitourinary: Guaiac negative stools Musculoskeletal: No lower extremity tenderness nor edema.  No joint effusions. Neurologic:  Normal speech and language. No gross focal neurologic deficits are appreciated. No facial droop Skin:  Skin is warm, dry and intact. No rash noted. Psychiatric: Mood and affect are normal. Speech and behavior are normal.  ____________________________________________   LABS (all labs ordered are listed, but only abnormal results are displayed)  Results for orders placed or performed during the hospital encounter of 09/13/20 (from the past 24 hour(s))  CBC     Status: Abnormal   Collection Time: 09/13/20  2:00 PM  Result Value Ref Range   WBC 6.9 4.0 - 10.5 K/uL   RBC 2.87 (L) 4.22 - 5.81 MIL/uL   Hemoglobin 8.3 (L) 13.0 - 17.0 g/dL   HCT 95.1 (L) 39 - 52 %   MCV 89.9 80.0 - 100.0 fL   MCH 28.9 26.0 - 34.0 pg   MCHC  32.2 30.0 - 36.0 g/dL   RDW 88.4 (H) 16.6 - 06.3 %   Platelets 216 150 - 400 K/uL   nRBC 0.0 0.0 - 0.2 %  Basic metabolic panel     Status: Abnormal   Collection Time: 09/13/20  2:00 PM  Result Value Ref Range   Sodium 131 (  L) 135 - 145 mmol/L   Potassium 3.6 3.5 - 5.1 mmol/L   Chloride 90 (L) 98 - 111 mmol/L   CO2 30 22 - 32 mmol/L   Glucose, Bld 122 (H) 70 - 99 mg/dL   BUN 49 (H) 8 - 23 mg/dL   Creatinine, Ser 4.09 (H) 0.61 - 1.24 mg/dL   Calcium 8.6 (L) 8.9 - 10.3 mg/dL   GFR, Estimated 36 (L) >60 mL/min   Anion gap 11 5 - 15  Type and screen [facility]     Status: None (Preliminary result)   Collection Time: 09/13/20  2:00 PM  Result Value Ref Range   ABO/RH(D) O POS    Antibody Screen NEG    Sample Expiration 09/16/2020,2359    Unit Number [id number]    Blood Component Type RBC LR PHER1    Unit division 00    Status of Unit ISSUED    Transfusion Status OK TO TRANSFUSE    Crossmatch Result      Compatible Performed at [facility], [address] [place], [place] [address]   Folate     Status: None   Collection Time: 09/13/20  2:00 PM  Result Value Ref Range   Folate 14.8 >5.9 ng/mL  Iron and TIBC     Status: Abnormal   Collection Time: 09/13/20  2:00 PM  Result Value Ref Range   Iron 16 (L) 45 - 182 ug/dL   TIBC 578 469 - 629 ug/dL   Saturation Ratios 4 (L) 17.9 - 39.5 %   UIBC 426 ug/dL  Ferritin     Status: None   Collection Time: 09/13/20  2:00 PM  Result Value Ref Range   Ferritin 42 24 - 336 ng/mL  Urinalysis, Complete w Microscopic Urine, Clean Catch     Status: Abnormal   Collection Time: 09/13/20  2:17 PM  Result Value Ref Range   Color, Urine YELLOW (A) YELLOW   APPearance CLEAR (A) CLEAR   Specific Gravity, Urine 1.008 1.005 - 1.030   pH 6.0 5.0 - 8.0   Glucose, UA NEGATIVE NEGATIVE mg/dL   Hgb urine dipstick NEGATIVE NEGATIVE   Bilirubin Urine NEGATIVE NEGATIVE   Ketones, ur NEGATIVE NEGATIVE mg/dL    Protein, ur NEGATIVE NEGATIVE mg/dL   Nitrite NEGATIVE NEGATIVE   Leukocytes,Ua NEGATIVE NEGATIVE   RBC / HPF 0-5 0 - 5 RBC/hpf   WBC, UA 0-5 0 - 5 WBC/hpf   Bacteria, UA NONE SEEN NONE SEEN   Squamous Epithelial / LPF 0-5 0 - 5   Mucus PRESENT    Hyaline Casts, UA PRESENT   Prepare RBC (crossmatch)     Status: None   Collection Time: 09/13/20  5:00 PM  Result Value Ref Range   Order Confirmation      ORDER PROCESSED BY BLOOD BANK Performed at [facility], [address] [place], [place] [address]   Respiratory Panel by RT PCR (Flu A&B, Covid) - Nasopharyngeal Swab     Status: None   Collection Time: 09/13/20  5:55 PM   Specimen: Nasopharyngeal Swab  Result Value Ref Range   SARS Coronavirus 2 by RT PCR NEGATIVE NEGATIVE   Influenza A by PCR NEGATIVE NEGATIVE   Influenza B by PCR NEGATIVE NEGATIVE  Reticulocytes     Status: Abnormal   Collection Time: 09/13/20  7:02 PM  Result Value Ref Range   Retic Ct Pct 1.7 0.4 - 3.1 %   RBC. 2.64 (L) 4.22 -  5.81 MIL/uL   Retic Count, Absolute 45.7 19.0 - 186.0 K/uL   Immature Retic Fract 17.4 (H) 2.3 - 15.9 %  Hemoglobin and hematocrit, blood     Status: Abnormal   Collection Time: 09/13/20  7:02 PM  Result Value Ref Range   Hemoglobin 7.7 (L) 13.0 - 17.0 g/dL   HCT 40.9 (L) 39 - 52 %   ____________________________________________  EKG My review and personal interpretation at Time: 14:03   Indication: weakness  Rate: 60  Rhythm: afib Axis: normal Other: nonspecific st abn ____________________________________________  RADIOLOGY  I personally reviewed all radiographic images ordered to evaluate for the above acute complaints and reviewed radiology reports and findings.  These findings were personally discussed with the patient.  Please see medical record for radiology report.  ____________________________________________   PROCEDURES  Procedure(s) performed:  Procedures    Critical Care performed:  no ____________________________________________   INITIAL IMPRESSION / ASSESSMENT AND PLAN / ED COURSE  Pertinent labs & imaging results that were available during my care of the patient were reviewed by me and considered in my medical decision making (see chart for details).   DDX: Hydration, acute anemia, GI bleed, anemia of chronic disease, supratherapeutic INR, sepsis  [name] is a 86 y.o. who presents to the ED with presentation as described above.  Patient pale appearing soft blood pressure.  Denies any hematochezia or melena.  Will check guaiac.  Blood work does show drop in hemoglobin concerning for symptomatic anemia.  INR is 1.4 from earlier today.  Is not complaining any chest pain or shortness of breath.  No report of trauma.  The patient will be placed on continuous pulse oximetry and telemetry for monitoring.  Laboratory evaluation will be sent to evaluate for the above complaints.     Clinical Course as of Sep 13 2002  Wed Sep 13, 2020  1706 Patient is ill-appearing, although may be chronically.  I am going to go ahead and transfuse for symptomatic anemia given recent hgb >10.  Blood pressure is slightly improved with some fluids.  Does show some signs of dehydration.  Patient will require hospitalization.   [PR]  1744 Stools are guaiac negative.   [PR]  1902 Case discussed with hospitalist.  They have requested repeat hemoglobin prior to admission given his recent admission for hemorrhagic shock.   [PR]    Clinical Course User Index [PR] Willy Eddy, MD    The patient was evaluated in Emergency Department today for the symptoms described in the history of present illness. He/she was evaluated in the context of the global COVID-19 pandemic, which necessitated consideration that the patient might be at risk for infection with the SARS-CoV-2 virus that causes COVID-19. Institutional protocols and algorithms that pertain to the evaluation of patients at risk  for COVID-19 are in a state of rapid change based on information released by regulatory bodies including the CDC and federal and state organizations. These policies and algorithms were followed during the patient's care in the ED.  As part of my medical decision making, I reviewed the following data within the electronic MEDICAL RECORD NUMBER Nursing notes reviewed and incorporated, Labs reviewed, notes from prior ED visits and [place] Controlled Substance Database   ____________________________________________   FINAL CLINICAL IMPRESSION(S) / ED DIAGNOSES  Final diagnoses:  Symptomatic anemia      NEW MEDICATIONS STARTED DURING THIS VISIT:  New Prescriptions   No medications on file     Note:  This document was  prepared using Conservation officer, historic buildings and may include unintentional dictation errors.    Willy Eddy, MD 09/13/20 2003

## 2020-09-13 NOTE — H&P (Addendum)
History and Physical    [name] JSE:[id number] DOB: 11-01-1931 DOA: 09/13/2020  PCP: Gracelyn Nurse, MD   Patient coming from: Home  I have personally briefly reviewed patient's old medical records in [facility]  Chief Complaint: Weakness, low hemoglobin  HPI: [name] is a 86 y.o. male with medical history significant for aortic stenosis s/p mechanical AVR on Coumadin, chronic diastolic heart failure, hypertension, CAD, chronic venous stasis, chronic anemia, GERD and history of C. difficile, and recent history of  GI hemorrhage in September 2021 with hb of 4, sent from his PCPs office with hemoglobin of 7.4.  He endorses weakness and lightheadedness.Marland Kitchen He denies chest pain, shortness of breath, palpitations, headache or blurred vision or one-sided weakness.  During his recent past hospitalization, Coumadin was 10 and he was treated with vitamin K, Kcentra and FFP.  He underwent colonoscopy that showed diverticulosis with blood in the colon ED Course: On arrival in the emergency room BP was 107/56, going as low as 97/59, pulse in the mid 50s.  Vitals otherwise within normal limits.  Blood work showed hemoglobin of 8.3, down from 10.63 weeks prior.  BMP significant for creatinine of 1.67, up from 0.98 three weeks prior.  INR 1.4 EKG as reviewed by me : Atrial fibrillation with slow ventricular response of 58.  Patient given 500 cc bolus of normal saline and started on 1 unit PRBCs.Marland Kitchen  Hospitalist consulted for admission  Review of Systems: As per HPI otherwise all other systems on review of systems negative.    Past Medical History:  Diagnosis Date  . Anxiety 10/11  . Aortic stenosis    a. s/p mechcanical AVR, 2002; b. 10/2018 Echo: Triv AI, mean grad .  . Bradycardia    chronic, no symptoms 07/2010  . C. difficile colitis   . Carotid bruit    dopplers in past, no abnormalities  . Chronic diastolic CHF (congestive heart failure) (HCC)    a. Echo  03/2015: EF 60-65%, no RWMA, GR1DD, mild BAE, mild to mod MR, mod TR, PASP 65 mmHg; b. 01/2017 Echo: EF 55-60%, NRWMA, grade 1 diastolic dysfunction.  Normal functioning prosthetic aortic valve.  Mean gradient 50 mmHg.  Sev TR. PASP ; c. 10/2018 Echo: EF 55-60%, Triv AI, mod dil LA, mod-sev TR, PASP 35-40, mild to mod red RV fxn.  . Coronary artery disease    a. mild, cath, 08/2010; b. medically managed  . Decreased hearing    Right ear  . Depression   . Gastric ulcer   . GERD (gastroesophageal reflux disease)   . Hypertension    BP higher than usual 04/19/10; amlodipine increased by telephone  . Mod-Sev Tricuspid regurgitation    a. 10/2018 Echo: Mod-Sev TR, PASP 35-65mmHg.  Marland Kitchen RLS (restless legs syndrome) 08/23/2015  . S/P AVR    a. [employer]. mechanical 2002; b. echo 08/2010 EF 60%, trival AI, mild MR, AVR working well; c. on longterm warfarin tx  . SOB (shortness of breath) 10/11   08/2010,Episodes at 5 AM, eventually felt to be anxiety, after complete workup including catheterization, pt greatly improved with anxiety meds 11/11    Past Surgical History:  Procedure Laterality Date  . CARDIAC CATHETERIZATION    . COLONOSCOPY WITH PROPOFOL N/A 08/03/2020   Procedure: COLONOSCOPY WITH PROPOFOL;  Surgeon: Toledo, Boykin Nearing, MD;  Location: [facility] ENDOSCOPY;  Service: Gastroenterology;  Laterality: N/A;  . ESOPHAGOGASTRODUODENOSCOPY N/A 04/05/2015   Procedure: ESOPHAGOGASTRODUODENOSCOPY (EGD);  Surgeon: Scot Jun,  MD;  Location: [facility] ENDOSCOPY;  Service: Endoscopy;  Laterality: N/A;  . ESOPHAGOGASTRODUODENOSCOPY N/A 04/17/2015   Procedure: ESOPHAGOGASTRODUODENOSCOPY (EGD);  Surgeon: Scot Jun, MD;  Location: [facility] ENDOSCOPY;  Service: Endoscopy;  Laterality: N/A;  . ESOPHAGOGASTRODUODENOSCOPY N/A 08/02/2015   Procedure: ESOPHAGOGASTRODUODENOSCOPY (EGD);  Surgeon: Wallace Cullens, MD;  Location: [facility] ENDOSCOPY;  Service: Endoscopy;  Laterality: N/A;  . ESOPHAGOGASTRODUODENOSCOPY N/A  07/30/2020   Procedure: ESOPHAGOGASTRODUODENOSCOPY (EGD);  Surgeon: Toledo, Boykin Nearing, MD;  Location: [facility] ENDOSCOPY;  Service: Gastroenterology;  Laterality: N/A;  . HERNIA REPAIR    . JOINT REPLACEMENT    . TOTAL HIP ARTHROPLASTY    . VALVE REPLACEMENT  1/02   Aortic; echo 3/09 valve working well; echo 10/11 working well; put on Coumadin     reports that he quit smoking about 42 years ago. His smoking use included cigarettes. He has never used smokeless tobacco. He reports current alcohol use of about 3.0 standard drinks of alcohol per week. He reports that he does not use drugs.  Allergies  Allergen Reactions  . Sulfa Antibiotics Other (See Comments)    Reaction:  Unknown   . Levofloxacin Nausea Only    Family History  Problem Relation Age of Onset  . Hypertension Mother   . Diabetes type II Mother   . Heart disease Mother   . Heart attack Mother   . Hypertension Father   . Heart disease Father   . Stroke Father   . Stroke Brother   . Stroke Brother   . Hypertension Other       Prior to Admission medications   Medication Sig Start Date End Date Taking? Authorizing Provider  allopurinol (ZYLOPRIM) 300 MG tablet Take 300 mg by mouth daily.  05/16/20  Yes [provider]  Cetirizine HCl 10 MG CAPS Take 10 mg by mouth daily.   Yes [provider]  finasteride (PROSCAR) 5 MG tablet Take 5 mg by mouth daily.     Yes [provider]  metoprolol succinate (TOPROL-XL) 25 MG 24 hr tablet Take 0.5 tablets (12.5 mg total) by mouth daily. 03/06/20  Yes Gollan, Tollie Pizza, MD  pantoprazole (PROTONIX) 40 MG tablet Take 40 mg by mouth daily.   Yes [provider]  potassium chloride (KLOR-CON) 10 MEQ tablet TAKE 3 TABLETS (30 MEQ) BY MOUTH DAILY. TAKE AN ADDITIONAL 2TABS ON THE DAYS YOU TAKE METOLAZONE 06/08/20  Yes Gollan, Tollie Pizza, MD  rOPINIRole (REQUIP) 2 MG tablet TAKE 1 TABLET BY MOUTH 4  TIMES DAILY Patient taking differently: Take 2 mg by mouth in  the morning, at noon, in the evening, and at bedtime. Take 1 tablet by mouth 4  times daily 09/14/18  Yes Millikan, Aundra Millet, NP  torsemide (DEMADEX) 20 MG tablet Take 2 tablets (40 mg total) by mouth daily. Take additional 40 mg for 3 pounds weight gain . Patient taking differently: Take 40 mg by mouth 2 (two) times daily.  07/14/20  Yes Meredeth Ide, MD  traZODone (DESYREL) 50 MG tablet Take 50 mg by mouth at bedtime.   Yes [provider]  warfarin (COUMADIN) 2.5 MG tablet Take 1 tablet (2.5 mg total) by mouth daily at 4 PM. Takes daily at 1700 Patient taking differently: Take 2.5-3.75 mg by mouth daily. Monday, Tuesday, thursday,friday,saturday, sunday 3.75mg -Wednesday 08/11/20  Yes Lurene Shadow, MD  Acetaminophen 500 MG capsule Take 2 capsules by mouth every 8 (eight) hours as needed.    [provider]  busPIRone (BUSPAR) 5  MG tablet Take 1 tablet (5 mg total) by mouth 2 (two) times daily. Patient not taking: Reported on 09/13/2020 08/10/20   Lurene Shadow, MD  DULoxetine (CYMBALTA) 30 MG capsule Take 1 capsule (30 mg total) by mouth daily. Patient not taking: Reported on 09/13/2020 07/15/20   Meredeth Ide, MD  metolazone (ZAROXOLYN) 2.5 MG tablet Take 1 tablet (2.5 mg total) by mouth 2 (two) times a week. 02/17/20 09/13/20  Antonieta Iba, MD    Physical Exam: Vitals:   09/13/20 1352 09/13/20 1353 09/13/20 1555 09/13/20 1815  BP: (!) 91/50  (!) 107/56 (!) 97/59  Pulse: (!) 57  (!) 57 (!) 55  Resp: 18  16 18   Temp: (!) 97.5 F (36.4 C)     TempSrc: Oral     SpO2: 100%  97% 97%  Weight:  90.7 kg    Height:  5\' 11"  (1.803 m)       Vitals:   09/13/20 1352 09/13/20 1353 09/13/20 1555 09/13/20 1815  BP: (!) 91/50  (!) 107/56 (!) 97/59  Pulse: (!) 57  (!) 57 (!) 55  Resp: 18  16 18   Temp: (!) 97.5 F (36.4 C)     TempSrc: Oral     SpO2: 100%  97% 97%  Weight:  90.7 kg    Height:  5\' 11"  (1.803 m)        Constitutional: Alert and oriented x 3 . Not in any  apparent distress HEENT:      Head: Normocephalic and atraumatic.         Eyes: PERLA, EOMI, Conjunctivae are normal. Sclera is non-icteric.       Mouth/Throat: Mucous membranes are moist.       Neck: Supple with no signs of meningismus. Cardiovascular: Regular rate and rhythm. No murmurs, gallops, or rubs. 2+ symmetrical distal pulses are present . No JVD. No LE edema Respiratory: Respiratory effort normal .Lungs sounds clear bilaterally. No wheezes, crackles, or rhonchi.  Gastrointestinal: Soft, non tender, and non distended with positive bowel sounds. No rebound or guarding. Genitourinary: No CVA tenderness. Musculoskeletal: Nontender with normal range of motion in all extremities. No cyanosis, or erythema of extremities. Neurologic:  Face is symmetric. Moving all extremities. No gross focal neurologic deficits . Skin: Skin is warm, dry.  No rash or ulcers Psychiatric: Mood and affect are normal    Labs on Admission: I have personally reviewed following labs and imaging studies  CBC: Recent Labs  Lab 09/13/20 1240 09/13/20 1400  WBC 7.5 6.9  HGB 7.7* 8.3*  HCT 24.1* 25.8*  MCV 90.3 89.9  PLT 191 216   Basic Metabolic Panel: Recent Labs  Lab 09/13/20 1240 09/13/20 1400  NA 131* 131*  K 3.8 3.6  CL 90* 90*  CO2 30 30  GLUCOSE 101* 122*  BUN 48* 49*  CREATININE 1.66* 1.67*  CALCIUM 8.7* 8.6*   GFR: Estimated Creatinine Clearance: 35.9 mL/min (A) (by C-G formula based on SCr of 1.67 mg/dL (H)). Liver Function Tests: Recent Labs  Lab 09/13/20 1240  AST 20  ALT 10  ALKPHOS 108  BILITOT 0.8  PROT 6.1*  ALBUMIN 3.5   No results for input(s): LIPASE, AMYLASE in the last 168 hours. No results for input(s): AMMONIA in the last 168 hours. Coagulation Profile: Recent Labs  Lab 09/13/20 1128  INR 1.4*   Cardiac Enzymes: No results for input(s): CKTOTAL, CKMB, CKMBINDEX, TROPONINI in the last 168 hours. BNP (last 3 results) No results for input(s):  PROBNP in the  last 8760 hours. HbA1C: No results for input(s): HGBA1C in the last 72 hours. CBG: No results for input(s): GLUCAP in the last 168 hours. Lipid Profile: No results for input(s): CHOL, HDL, LDLCALC, TRIG, CHOLHDL, LDLDIRECT in the last 72 hours. Thyroid Function Tests: No results for input(s): TSH, T4TOTAL, FREET4, T3FREE, THYROIDAB in the last 72 hours. Anemia Panel: No results for input(s): VITAMINB12, FOLATE, FERRITIN, TIBC, IRON, RETICCTPCT in the last 72 hours. Urine analysis:    Component Value Date/Time   COLORURINE YELLOW (A) 09/13/2020 1417   APPEARANCEUR CLEAR (A) 09/13/2020 1417   APPEARANCEUR Clear 03/20/2015 1146   LABSPEC 1.008 09/13/2020 1417   LABSPEC 1.033 03/20/2015 1146   PHURINE 6.0 09/13/2020 1417   GLUCOSEU NEGATIVE 09/13/2020 1417   GLUCOSEU Negative 03/20/2015 1146   HGBUR NEGATIVE 09/13/2020 1417   BILIRUBINUR NEGATIVE 09/13/2020 1417   BILIRUBINUR 1+ 03/20/2015 1146   KETONESUR NEGATIVE 09/13/2020 1417   PROTEINUR NEGATIVE 09/13/2020 1417   NITRITE NEGATIVE 09/13/2020 1417   LEUKOCYTESUR NEGATIVE 09/13/2020 1417   LEUKOCYTESUR Negative 03/20/2015 1146    Radiological Exams on Admission: DG Chest Portable 1 View  Result Date: 09/13/2020 CLINICAL DATA:  Weakness. EXAM: PORTABLE CHEST 1 VIEW COMPARISON:  August 07, 2020 FINDINGS: Multiple sternal wires are seen. Decreased lung volumes are noted with mild diffuse chronic appearing increased interstitial lung markings. There is no evidence of focal consolidation, pleural effusion or pneumothorax. Stable moderate to marked severity enlargement of the cardiac silhouette is seen. An artificial cardiac valve is noted. There is a large hiatal hernia. Multilevel degenerative changes seen throughout the thoracic spine. IMPRESSION: 1. Evidence of prior median sternotomy/CABG. 2. Stable moderate to marked severity cardiomegaly. 3. Large hiatal hernia. Electronically Signed   By: Aram Candela M.D.   On:  09/13/2020 16:42     Assessment/Plan 86 year old male with history of aortic stenosis s/p mechanical AVR on Coumadin, chronic diastolic heart failure, hypertension, CAD, chronic venous stasis, chronic anemia, GERD and history of C. difficile, and recent history of  GI hemorrhage in September 2021 presenting with hemoglobin 7.7 associated with lightheadedness    Acute blood loss anemia/symptomatic anemia   History of lower GI bleeding with hemorrhagic shock Warfarin anticoagulation -Patient on chronic anticoagulation with Coumadin secondary to mechanical heart valve sent from clinic for low hemoglobin of 7.7, down from 10.6  three weeks prior -Patient hypotensive in the 90s over 50s, improving after bolus -continue transfusion of 1 unit PRBC started in the ED -Patient had colonoscopy in September during his admission that showed diverticulosis and blood in the sigmoid colon -INR 1.4.  Hold Coumadin, FFP -Close monitoring  -Serial H/H    Hypotension -Hold antihypertensives    Coronary artery disease -Hold antiplatelets and beta-blockers as well as nitrates given hypotension and bleeding    Warfarin anticoagulation   History of mechanical aortic valve replacement -Holding Coumadin for aforementioned reasons    Chronic diastolic CHF (congestive heart failure) (HCC) -Holding all BP lowering meds for CHF management    AKI (acute kidney injury) (HCC) -Creatinine 1.64, above baseline of 0.98 -IV hydration    Permanent atrial fibrillation (HCC) -Holding rate control agents due to BP lowering effect. -Holding blood thinners for aforementioned reasons.    DVT prophylaxis: SCDs Code Status: full code  Family Communication:  none  Disposition Plan: Back to previous home environment Consults called: none  Status:At the time of admission, it appears that the appropriate admission status for this patient is  INPATIENT. This is judged to be reasonable and necessary in order to provide the  required intensity of service to ensure the patient's safety given the presenting symptoms, physical exam findings, and initial radiographic and laboratory data in the context of their  Comorbid conditions.   Patient requires inpatient status due to high intensity of service, high risk for further deterioration and high frequency of surveillance required.   I certify that at the point of admission it is my clinical judgment that the patient will require inpatient hospital care spanning beyond 2 midnights     Andris BaumannHazel V Lezlee Gills MD [facility]     09/13/2020, 6:17 PM

## 2020-09-13 NOTE — Patient Instructions (Addendum)
Medication Instructions:  Your physician has recommended you make the following change in your medication:   STOP Isosorbide Mononitrate (Imdur)  HOLD Torsemide this afternoon and tomorrow  DRINK lots of fluid today  On Friday resume Torsemide 40mg  once daily  HOLD Metolazone until Monday  *If you need a refill on your cardiac medications before your next appointment, please call your pharmacy*  Lab Work: Your physician recommends that you return for lab work today at Monday: STAT CBC, CMP, BNP  If you have labs (blood work) drawn today and your tests are completely normal, you will receive your results only by: [facility] MyChart Message (if you have MyChart) OR . A paper copy in the mail If you have any lab test that is abnormal or we need to change your treatment, we will call you to review the results.   Testing/Procedures: Your EKG today showed known atrial fibrillation with bradycardia.  Follow-Up: At [facility], you and your health needs are our priority.  As part of our continuing mission to provide you with exceptional heart care, we have created designated Provider Care Teams.  These Care Teams include your primary Cardiologist (physician) and Advanced Practice Providers (APPs -  Physician Assistants and Nurse Practitioners) who all work together to provide you with the care you need, when you need it.  We recommend signing up for the patient portal called "MyChart".  Sign up information is provided on this After Visit Summary.  MyChart is used to connect with patients for Virtual Visits (Telemedicine).  Patients are able to view lab/test results, encounter notes, upcoming appointments, etc.  Non-urgent messages can be sent to your provider as well.   To learn more about what you can do with MyChart, go to [facility].    Your next appointment:   1 week(s)  The format for your next appointment:   In Person  Provider:   You may see [email or web address], MD or  one of the following Advanced Practice Providers on your designated Care Team:    Julien Nordmann, NP  Nicolasa Ducking, PA-C  Eula Listen, PA-C  Cadence Duquesne, [place]  [place], NP

## 2020-09-13 NOTE — Telephone Encounter (Signed)
STAT labs from clinic returned with Hb of 7.7 (previous 08/21/20 10.6).  Called his daughter [name] and instructed to report to ED. She verbalized understanding of the plan and was agreeable.   Called ED RN and gave report that [name] was coming due to low hemoglobin as well as hypotension.   Alver Sorrow, NP

## 2020-09-13 NOTE — Telephone Encounter (Addendum)
STAT labs from clinic returned with K 3.8, Na 131, Chloride 90, CO2, 30. His renal function is worsening with creatinine 1.66 aand GFR 37. His liver enzymes were normal. BNP elevated at 379.3 (previously 08/21/20 was 202.5)/  Called ED RN to give update however was disconnected twice. Have sent Epic chat message to the RN who triaged him to make her aware.   Called daughter to make her aware.   Alver Sorrow, NP

## 2020-09-13 NOTE — ED Notes (Signed)
Pt sent from [facility] due to low hemoglobin 7.7 and hypotension (80/44). The remainder of his stat-labs came back which show worsening renal function as well as hyponatremia (Na 131).   [name] Hartshorn, NP

## 2020-09-14 DIAGNOSIS — E876 Hypokalemia: Secondary | ICD-10-CM | POA: Diagnosis not present

## 2020-09-14 DIAGNOSIS — D62 Acute posthemorrhagic anemia: Secondary | ICD-10-CM | POA: Diagnosis not present

## 2020-09-14 DIAGNOSIS — N179 Acute kidney failure, unspecified: Secondary | ICD-10-CM

## 2020-09-14 LAB — BASIC METABOLIC PANEL
Anion gap: 11 (ref 5–15)
BUN: 46 mg/dL — ABNORMAL HIGH (ref 8–23)
CO2: 26 mmol/L (ref 22–32)
Calcium: 7.8 mg/dL — ABNORMAL LOW (ref 8.9–10.3)
Chloride: 95 mmol/L — ABNORMAL LOW (ref 98–111)
Creatinine, Ser: 1.36 mg/dL — ABNORMAL HIGH (ref 0.61–1.24)
GFR, Estimated: 46 mL/min — ABNORMAL LOW (ref >60)
Glucose, Bld: 93 mg/dL (ref 70–99)
Potassium: 2.8 mmol/L — ABNORMAL LOW (ref 3.5–5.1)
Sodium: 132 mmol/L — ABNORMAL LOW (ref 135–145)

## 2020-09-14 LAB — CBC
HCT: 26.2 % — ABNORMAL LOW (ref 39.0–52.0)
Hemoglobin: 8.6 g/dL — ABNORMAL LOW (ref 13.0–17.0)
MCH: 29.4 pg (ref 26.0–34.0)
MCHC: 32.8 g/dL (ref 30.0–36.0)
MCV: 89.4 fL (ref 80.0–100.0)
Platelets: 187 10*3/uL (ref 150–400)
RBC: 2.93 MIL/uL — ABNORMAL LOW (ref 4.22–5.81)
RDW: 16.1 % — ABNORMAL HIGH (ref 11.5–15.5)
WBC: 5.2 10*3/uL (ref 4.0–10.5)
nRBC: 0 % (ref 0.0–0.2)

## 2020-09-14 LAB — TYPE AND SCREEN
ABO/RH(D): O POS
Antibody Screen: NEGATIVE
Unit division: 0

## 2020-09-14 LAB — BPAM RBC
Blood Product Expiration Date: 202111102359
ISSUE DATE / TIME: 202110201811
Unit Type and Rh: 5100

## 2020-09-14 MED ORDER — POTASSIUM CHLORIDE CRYS ER 20 MEQ PO TBCR
40.0000 meq | EXTENDED_RELEASE_TABLET | Freq: Two times a day (BID) | ORAL | Status: AC
  Administered 2020-09-14 (×2): 40 meq via ORAL
  Filled 2020-09-14 (×2): qty 2

## 2020-09-14 MED ORDER — ROPINIROLE HCL 1 MG PO TABS
1.0000 mg | ORAL_TABLET | Freq: Every day | ORAL | Status: DC
  Administered 2020-09-15 – 2020-09-26 (×11): 1 mg via ORAL
  Filled 2020-09-14 (×13): qty 1

## 2020-09-14 MED ORDER — ROPINIROLE HCL 1 MG PO TABS
2.0000 mg | ORAL_TABLET | Freq: Three times a day (TID) | ORAL | Status: DC
  Administered 2020-09-14 – 2020-09-26 (×34): 2 mg via ORAL
  Filled 2020-09-14 (×35): qty 2

## 2020-09-14 MED ORDER — POTASSIUM CHLORIDE 10 MEQ/100ML IV SOLN
10.0000 meq | INTRAVENOUS | Status: DC
  Administered 2020-09-14: 10 meq via INTRAVENOUS
  Filled 2020-09-14: qty 100

## 2020-09-14 MED ORDER — DOCUSATE SODIUM 100 MG PO CAPS
200.0000 mg | ORAL_CAPSULE | Freq: Two times a day (BID) | ORAL | Status: DC
  Administered 2020-09-14 – 2020-09-26 (×21): 200 mg via ORAL
  Filled 2020-09-14 (×23): qty 2

## 2020-09-14 MED ORDER — BUSPIRONE HCL 5 MG PO TABS
5.0000 mg | ORAL_TABLET | Freq: Two times a day (BID) | ORAL | Status: DC
  Administered 2020-09-14 – 2020-09-26 (×24): 5 mg via ORAL
  Filled 2020-09-14 (×28): qty 1

## 2020-09-14 MED ORDER — DULOXETINE HCL 30 MG PO CPEP
30.0000 mg | ORAL_CAPSULE | Freq: Every day | ORAL | Status: DC
  Administered 2020-09-14 – 2020-09-26 (×13): 30 mg via ORAL
  Filled 2020-09-14 (×14): qty 1

## 2020-09-14 NOTE — ED Notes (Signed)
Pt continues to c/o arm burning due to potassium infusion. MD notified and RN requested further guidance on administration, this RN currently waiting for further instructions regarding k+ administration.

## 2020-09-14 NOTE — ED Notes (Signed)
Pt c/o arm burning from IV potassium infusion. Rate adjusted for comfort from 154ml/hr to 50 ml/hr

## 2020-09-14 NOTE — ED Notes (Signed)
Pt appears to be having visual hallucinations at this time, picking at things in the air that are not there.

## 2020-09-14 NOTE — Consult Note (Signed)
[name] MoodKiran Emali Heyward , MD [address], [address], [place], [place], [id number] [address], [place], [place], [address] Phone: [phone]  Fax: [phone]  Consultation  Referring Provider:    Dr Para Marchuncan Primary Care Physician:  Gracelyn NurseJohnston, [name], MD Primary Gastroenterologist:  Dr. Norma Fredricksonoledo         Reason for Consultation:     GI bleed   Date of Admission:  09/13/2020 Date of Consultation:  09/14/2020         HPI:   Oren BracketSydnor Jennefer Bravornold Leikam is a 86 y.o. male presented to the emergency room with weakness and a low hemoglobin.  History of aortic stenosis s/p mechanical aortic valve replacement on Coumadin.  Also has a history of chronic diastolic heart failure coronary artery disease, hypertension GERD and prior history of C. difficile diarrhea.   He was admitted last month to the hospital and was seen on 07/29/2020 when he presented with hemorrhagic shock hypotension and an INR greater than 10.  He underwent an upper endoscopy on 07/30/2020 by Dr. Norma Fredricksonoledo that demonstrated a large hiatal hernia but otherwise the exam was normal.  He underwent a colonoscopy on 08/03/2020 which showed nonbleeding internal hemorrhoids and moderate stool in the entire colon making visualization hard and multiple diverticuli were seen throughout the sigmoid and descending colon.  He was discharged on 08/10/2020.  This admission on arrival he was not hypotensive but had a hemoglobin of 8.3 g down from 10.6 a few weeks prior.  AKI and INR 1.4.  Iron was low at 16 and ferritin 42, urine analysis showed no abnormalities B12 awaited this morning hemoglobin is 8.6 g  I went to see the patient in the ER.  Accompanied by his daughter.  He is very hard of hearing.  He absolutely denies seeing any blood in his stool.  Normal brown stool.  Denies any hematemesis.  Denies any NSAID use.  Denies any abdominal pain.  What he says is he wants to eat. Past Medical History:  Diagnosis Date  . Anxiety 10/11  . Aortic stenosis    a.  s/p mechcanical AVR, 2002; b. 10/2018 Echo: Triv AI, mean grad 9mmHg.  . Bradycardia    chronic, no symptoms 07/2010  . C. difficile colitis   . Carotid bruit    dopplers in past, no abnormalities  . Chronic diastolic CHF (congestive heart failure) (HCC)    a. Echo 03/2015: EF 60-65%, no RWMA, GR1DD, mild BAE, mild to mod MR, mod TR, PASP 65 mmHg; b. 01/2017 Echo: EF 55-60%, NRWMA, grade 1 diastolic dysfunction.  Normal functioning prosthetic aortic valve.  Mean gradient 50 mmHg.  Sev TR. PASP 54mmHg; c. 10/2018 Echo: EF 55-60%, Triv AI, mod dil LA, mod-sev TR, PASP 35-40, mild to mod red RV fxn.  . Coronary artery disease    a. mild, cath, 08/2010; b. medically managed  . Decreased hearing    Right ear  . Depression   . Gastric ulcer   . GERD (gastroesophageal reflux disease)   . Hypertension    BP higher than usual 04/19/10; amlodipine increased by telephone  . Mod-Sev Tricuspid regurgitation    a. 10/2018 Echo: Mod-Sev TR, PASP 35-2340mmHg.  Marland Kitchen. RLS (restless legs syndrome) 08/23/2015  . S/P AVR    a. [employer]. mechanical 2002; b. echo 08/2010 EF 60%, trival AI, mild MR, AVR working well; c. on longterm warfarin tx  . SOB (shortness of breath) 10/11   08/2010,Episodes at 5 AM, eventually felt to  be anxiety, after complete workup including catheterization, pt greatly improved with anxiety meds 11/11    Past Surgical History:  Procedure Laterality Date  . CARDIAC CATHETERIZATION    . COLONOSCOPY WITH PROPOFOL N/A 08/03/2020   Procedure: COLONOSCOPY WITH PROPOFOL;  Surgeon: Toledo, Boykin Nearing, MD;  Location: [facility] ENDOSCOPY;  Service: Gastroenterology;  Laterality: N/A;  . ESOPHAGOGASTRODUODENOSCOPY N/A 04/05/2015   Procedure: ESOPHAGOGASTRODUODENOSCOPY (EGD);  Surgeon: Scot Jun, MD;  Location: [facility] ENDOSCOPY;  Service: Endoscopy;  Laterality: N/A;  . ESOPHAGOGASTRODUODENOSCOPY N/A 04/17/2015   Procedure: ESOPHAGOGASTRODUODENOSCOPY (EGD);  Surgeon: Scot Jun, MD;  Location: [facility]  ENDOSCOPY;  Service: Endoscopy;  Laterality: N/A;  . ESOPHAGOGASTRODUODENOSCOPY N/A 08/02/2015   Procedure: ESOPHAGOGASTRODUODENOSCOPY (EGD);  Surgeon: Wallace Cullens, MD;  Location: [facility] ENDOSCOPY;  Service: Endoscopy;  Laterality: N/A;  . ESOPHAGOGASTRODUODENOSCOPY N/A 07/30/2020   Procedure: ESOPHAGOGASTRODUODENOSCOPY (EGD);  Surgeon: Toledo, Boykin Nearing, MD;  Location: [facility] ENDOSCOPY;  Service: Gastroenterology;  Laterality: N/A;  . HERNIA REPAIR    . JOINT REPLACEMENT    . TOTAL HIP ARTHROPLASTY    . VALVE REPLACEMENT  1/02   Aortic; echo 3/09 valve working well; echo 10/11 working well; put on Coumadin    Prior to Admission medications   Medication Sig Start Date End Date Taking? Authorizing Provider  allopurinol (ZYLOPRIM) 300 MG tablet Take 300 mg by mouth daily.  05/16/20  Yes [provider]  Cetirizine HCl 10 MG CAPS Take 10 mg by mouth daily.   Yes [provider]  finasteride (PROSCAR) 5 MG tablet Take 5 mg by mouth daily.     Yes [provider]  metoprolol succinate (TOPROL-XL) 25 MG 24 hr tablet Take 0.5 tablets (12.5 mg total) by mouth daily. 03/06/20  Yes Gollan, Tollie Pizza, MD  pantoprazole (PROTONIX) 40 MG tablet Take 40 mg by mouth daily.   Yes [provider]  potassium chloride (KLOR-CON) 10 MEQ tablet TAKE 3 TABLETS (30 MEQ) BY MOUTH DAILY. TAKE AN ADDITIONAL 2TABS ON THE DAYS YOU TAKE METOLAZONE 06/08/20  Yes Gollan, Tollie Pizza, MD  rOPINIRole (REQUIP) 2 MG tablet TAKE 1 TABLET BY MOUTH 4  TIMES DAILY Patient taking differently: Take 2 mg by mouth in the morning, at noon, in the evening, and at bedtime. Take 1 tablet by mouth 4  times daily 09/14/18  Yes Millikan, Aundra Millet, NP  torsemide (DEMADEX) 20 MG tablet Take 2 tablets (40 mg total) by mouth daily. Take additional 40 mg for 3 pounds weight gain . Patient taking differently: Take 40 mg by mouth 2 (two) times daily.  07/14/20  Yes Meredeth Ide, MD  traZODone (DESYREL) 50 MG tablet Take 50 mg by  mouth at bedtime.   Yes [provider]  warfarin (COUMADIN) 2.5 MG tablet Take 1 tablet (2.5 mg total) by mouth daily at 4 PM. Takes daily at 1700 Patient taking differently: Take 2.5-3.75 mg by mouth daily. Monday, Tuesday, thursday,friday,saturday, sunday 3.75mg -Wednesday 08/11/20  Yes Lurene Shadow, MD  Acetaminophen 500 MG capsule Take 2 capsules by mouth every 8 (eight) hours as needed.    [provider]  busPIRone (BUSPAR) 5 MG tablet Take 1 tablet (5 mg total) by mouth 2 (two) times daily. Patient not taking: Reported on 09/13/2020 08/10/20   Lurene Shadow, MD  DULoxetine (CYMBALTA) 30 MG capsule Take 1 capsule (30 mg total) by mouth daily. Patient not taking: Reported on 09/13/2020 07/15/20   Meredeth Ide, MD  metolazone (ZAROXOLYN) 2.5 MG tablet Take 1 tablet (2.5  mg total) by mouth 2 (two) times a week. 02/17/20 09/13/20  Antonieta Iba, MD    Family History  Problem Relation Age of Onset  . Hypertension Mother   . Diabetes type II Mother   . Heart disease Mother   . Heart attack Mother   . Hypertension Father   . Heart disease Father   . Stroke Father   . Stroke Brother   . Stroke Brother   . Hypertension Other      Social History   Tobacco Use  . Smoking status: Former Smoker    Types: Cigarettes    Quit date: 1979    Years since quitting: 42.8  . Smokeless tobacco: Never Used  Vaping Use  . Vaping Use: Never used  Substance Use Topics  . Alcohol use: Yes    Alcohol/week: 3.0 standard drinks    Types: 3 Glasses of wine per week  . Drug use: No    Allergies as of 09/13/2020 - Review Complete 09/13/2020  Allergen Reaction Noted  . Sulfa antibiotics Other (See Comments) 03/24/2015  . Levofloxacin Nausea Only 04/16/2015    Review of Systems:    All systems reviewed and negative except where noted in HPI.   Physical Exam:  Vital signs in last 24 hours: Temp:  [97.3 F (36.3 C)-97.5 F (36.4 C)] 97.3 F (36.3 C) (10/20 1838) Pulse  Rate:  [52-65] 61 (10/21 0626) Resp:  [10-18] 18 (10/21 0626) BP: (80-112)/(41-85) 104/66 (10/21 0626) SpO2:  [95 %-100 %] 96 % (10/21 0626) Weight:  [90.7 kg-91.7 kg] 90.7 kg (10/20 1353)   General:   Pleasant, cooperative in NAD Head:  Normocephalic and atraumatic. Eyes:   No icterus.   Conjunctiva pink. PERRLA. Ears:  Normal auditory acuity. Neck:  Supple; no masses or thyroidomegaly Lungs: Respirations even and unlabored. Lungs clear to auscultation bilaterally.   No wheezes, crackles, or rhonchi.  Heart:  Regular rate and rhythm; loud A2, without murmur, clicks, rubs or gallops Abdomen:  Soft, nondistended, nontender. Normal bowel sounds. No appreciable masses or hepatomegaly.  No rebound or guarding.  Neurologic:  Alert and oriented x3;  grossly normal neurologically. Skin:  Intact without significant lesions or rashes. Cervical Nodes:  No significant cervical adenopathy. Psych:  Alert and cooperative. Normal affect.  LAB RESULTS: Recent Labs    09/13/20 1240 09/13/20 1240 09/13/20 1400 09/13/20 1902 09/14/20 0447  WBC 7.5  --  6.9  --  5.2  HGB 7.7*   < > 8.3* 7.7* 8.6*  HCT 24.1*   < > 25.8* 23.7* 26.2*  PLT 191  --  216  --  187   < > = values in this interval not displayed.   BMET Recent Labs    09/13/20 1240 09/13/20 1400 09/14/20 0447  NA 131* 131* 132*  K 3.8 3.6 2.8*  CL 90* 90* 95*  CO2 30 30 26   GLUCOSE 101* 122* 93  BUN 48* 49* 46*  CREATININE 1.66* 1.67* 1.36*  CALCIUM 8.7* 8.6* 7.8*   LFT Recent Labs    09/13/20 1240  PROT 6.1*  ALBUMIN 3.5  AST 20  ALT 10  ALKPHOS 108  BILITOT 0.8   PT/INR Recent Labs    09/13/20 1128  INR 1.4*    STUDIES: DG Chest Portable 1 View  Result Date: 09/13/2020 CLINICAL DATA:  Weakness. EXAM: PORTABLE CHEST 1 VIEW COMPARISON:  August 07, 2020 FINDINGS: Multiple sternal wires are seen. Decreased lung volumes are noted with mild diffuse chronic  appearing increased interstitial lung markings. There  is no evidence of focal consolidation, pleural effusion or pneumothorax. Stable moderate to marked severity enlargement of the cardiac silhouette is seen. An artificial cardiac valve is noted. There is a large hiatal hernia. Multilevel degenerative changes seen throughout the thoracic spine. IMPRESSION: 1. Evidence of prior median sternotomy/CABG. 2. Stable moderate to marked severity cardiomegaly. 3. Large hiatal hernia. Electronically Signed   By: Aram Candela M.D.   On: 09/13/2020 16:42      Impression / Plan:   Viral Jaskarn Schweer is a 86 y.o. y/o male with a mechanical aortic valve replacement on Coumadin in addition to congestive heart failure atrial fibrillation admitted with weakness and found to have a 2 g drop in hemoglobin compared to what he was last month when he was admitted with a GI bleed which was evaluated by Dr. Norma Fredrickson and noted to have a large hiatal hernia on upper endoscopy and diverticulosis of the colon on colonoscopy.  The prep was fair but coffee-ground material was seen throughout the colon.  The patient has not had a capsule study of the small bowel.  On this admission noted to have iron deficiency, INR 1.4 on admission and complicated by AKI.  Very likely that he could have had a diverticular bleed considering his history but a small bowel bleed cannot be ruled out.  Patients who have aortic stenosis have a condition Heydes syndrome which is an acquired von Willebrand factor deficiency which usually resolves with valve replacement.  This condition is associated with small bowel AVMs.  Plan  1.  Monitor CBC and transfuse 2.  Continue PPI 3.  If has active bleeding obtain a tagged RBC scan 4.  Suggest a dose of IV iron 5.  We will plan for capsule study of the small bowel tomorrow, consent has been obtained  Risks, benefits, alternatives of Givens capsule discussed with patient to include but not limited to the rare risk of Given's capsule becoming lodged in the GI  tract requiring surgical removal.  The patient agrees with this plan & consent will be obtained.   Thank you for involving me in the care of this patient.      LOS: 1 day   [name] Mood, MD  09/14/2020, 8:28 AM

## 2020-09-14 NOTE — ED Notes (Signed)
Patient placed on bedpan per pt request.

## 2020-09-14 NOTE — ED Notes (Signed)
Consent form completed at this time for capsule study to take place on 09/15/20. Consent form placed with pt chart. Verbal orders taken for pt to receive a soft diet until midnight tonight, at which time pt is to be NPO until procedure takes place.

## 2020-09-14 NOTE — Progress Notes (Signed)
PROGRESS NOTE    [name]  OIZ:[id number] DOB: September 07, 1931 DOA: 09/13/2020 PCP: Gracelyn Nurse, MD    Assessment & Plan:   Principal Problem:   Acute blood loss anemia Active Problems:   Hypertension   Coronary artery disease   S/P AVR   Warfarin anticoagulation   History of mechanical aortic valve replacement   Chronic diastolic CHF (congestive heart failure) (HCC)   AKI (acute kidney injury) (HCC)   Permanent atrial fibrillation (HCC)   History of lower GI bleeding   Symptomatic anemia   Acute blood loss anemia & possible GI bleed: etiology unclear, but on warfarin for mechanical heart valve. Continue to hold warfarin. Hx of lower GI bleed w/ hemorrhagic shock. S/p 1 unit of pRBCs transfused 09/13/20. S/p colonoscopy in 07/2020 which showed diverticulosis & blood in the sigmoid colon. Will continue to monitor H&H. Will have capsule study of small bowel tomorrow as per GI. If active bleeding obtain tagged RBC scan as per GI   Hypotension: secondary to above. Keep MAP >65  Hypokalemia: KCl repleted. Will continue to monitor   CAD: continue to hold aspirin, warfarin, BB & nitrates given hypotension & bleeding   Hx of mechanical aortic valve replacement: continue to hold warfarin. Continue on tele   Chronic diastolic CHF: appears euvolemic. Monitor I/Os  AKI: baseline Cr 0.98. Cr is trending down from day prior. Continue on IVFs.   Permanent a. fib: continue to hold metoprolol and warfarin. Will continue on tele     DVT prophylaxis: SCDs secondary to above  Code Status: full  Family Communication: Disposition Plan: depends on PT/OT recs (not consulted yet)   Status is: Inpatient  Remains inpatient appropriate because:Hemodynamically unstable   Dispo: The patient is from: Home              Anticipated d/c is to: Home vs SNF              Anticipated d/c date is: 3 days              Patient currently is not medically stable to  d/c.      Consultants:   GI    Procedures:   Antimicrobials:    Subjective: Pt c/o being thirsty and hungry  Objective: Vitals:   09/14/20 0105 09/14/20 0312 09/14/20 0447 09/14/20 0626  BP: (!) 104/41 (!) 86/52 101/70 104/66  Pulse: (!) 55 (!) 59 (!) 57 61  Resp: 13 12 13 18   Temp:      TempSrc:      SpO2: 99% 99% 99% 96%  Weight:      Height:        Intake/Output Summary (Last 24 hours) at 09/14/2020 0803 Last data filed at 09/13/2020 2004 Gross per 24 hour  Intake 1355.21 ml  Output --  Net 1355.21 ml   Filed Weights   09/13/20 1353  Weight: 90.7 kg    Examination:  General exam: Appears calm and comfortable. Hard of hearing  Respiratory system: Clear to auscultation. Respiratory effort normal. Cardiovascular system: S1 & S2+.No  rubs, gallops or clicks.. Gastrointestinal system: Abdomen is obese, soft and nontender. Normal bowel sounds heard. Central nervous system: Alert and oriented. Moves all extremities  Psychiatry: Judgement and insight appear normal. Mood & affect appropriate.     Data Reviewed: I have personally reviewed following labs and imaging studies  CBC: Recent Labs  Lab 09/13/20 1240 09/13/20 1400 09/13/20 1902 09/14/20 0447  WBC 7.5 6.9  --  5.2  HGB 7.7* 8.3* 7.7* 8.6*  HCT 24.1* 25.8* 23.7* 26.2*  MCV 90.3 89.9  --  89.4  PLT 191 216  --  187   Basic Metabolic Panel: Recent Labs  Lab 09/13/20 1240 09/13/20 1400 09/14/20 0447  NA 131* 131* 132*  K 3.8 3.6 2.8*  CL 90* 90* 95*  CO2 30 30 26   GLUCOSE 101* 122* 93  BUN 48* 49* 46*  CREATININE 1.66* 1.67* 1.36*  CALCIUM 8.7* 8.6* 7.8*   GFR: Estimated Creatinine Clearance: 44.1 mL/min (A) (by C-G formula based on SCr of 1.36 mg/dL (H)). Liver Function Tests: Recent Labs  Lab 09/13/20 1240  AST 20  ALT 10  ALKPHOS 108  BILITOT 0.8  PROT 6.1*  ALBUMIN 3.5   No results for input(s): LIPASE, AMYLASE in the last 168 hours. No results for input(s): AMMONIA  in the last 168 hours. Coagulation Profile: Recent Labs  Lab 09/13/20 1128  INR 1.4*   Cardiac Enzymes: No results for input(s): CKTOTAL, CKMB, CKMBINDEX, TROPONINI in the last 168 hours. BNP (last 3 results) No results for input(s): PROBNP in the last 8760 hours. HbA1C: No results for input(s): HGBA1C in the last 72 hours. CBG: No results for input(s): GLUCAP in the last 168 hours. Lipid Profile: No results for input(s): CHOL, HDL, LDLCALC, TRIG, CHOLHDL, LDLDIRECT in the last 72 hours. Thyroid Function Tests: No results for input(s): TSH, T4TOTAL, FREET4, T3FREE, THYROIDAB in the last 72 hours. Anemia Panel: Recent Labs    09/13/20 1400 09/13/20 1902  FOLATE 14.8  --   FERRITIN 42  --   TIBC 442  --   IRON 16*  --   RETICCTPCT  --  1.7   Sepsis Labs: No results for input(s): PROCALCITON, LATICACIDVEN in the last 168 hours.  Recent Results (from the past 240 hour(s))  Respiratory Panel by RT PCR (Flu A&B, Covid) - Nasopharyngeal Swab     Status: None   Collection Time: 09/13/20  5:55 PM   Specimen: Nasopharyngeal Swab  Result Value Ref Range Status   SARS Coronavirus 2 by RT PCR NEGATIVE NEGATIVE Final    Comment: (NOTE) SARS-CoV-2 target nucleic acids are NOT DETECTED.  The SARS-CoV-2 RNA is generally detectable in upper respiratoy specimens during the acute phase of infection. The lowest concentration of SARS-CoV-2 viral copies this assay can detect is 131 copies/mL. A negative result does not preclude SARS-Cov-2 infection and should not be used as the sole basis for treatment or other patient management decisions. A negative result may occur with  improper specimen collection/handling, submission of specimen other than nasopharyngeal swab, presence of viral mutation(s) within the areas targeted by this assay, and inadequate number of viral copies (<131 copies/mL). A negative result must be combined with clinical observations, patient history, and  epidemiological information. The expected result is Negative.  Fact Sheet for Patients:  09/15/20  Fact Sheet for Healthcare Providers:  https://www.moore.com/  This test is no t yet approved or cleared by the https://www.young.biz/ FDA and  has been authorized for detection and/or diagnosis of SARS-CoV-2 by FDA under an Emergency Use Authorization (EUA). This EUA will remain  in effect (meaning this test can be used) for the duration of the COVID-19 declaration under Section 564(b)(1) of the Act, 21 U.S.C. section 360bbb-3(b)(1), unless the authorization is terminated or revoked sooner.     Influenza A by PCR NEGATIVE NEGATIVE Final   Influenza B by PCR NEGATIVE NEGATIVE Final    Comment: (NOTE) The  Xpert Xpress SARS-CoV-2/FLU/RSV assay is intended as an aid in  the diagnosis of influenza from Nasopharyngeal swab specimens and  should not be used as a sole basis for treatment. Nasal washings and  aspirates are unacceptable for Xpert Xpress SARS-CoV-2/FLU/RSV  testing.  Fact Sheet for Patients: https://www.moore.com/  Fact Sheet for Healthcare Providers: https://www.young.biz/  This test is not yet approved or cleared by the [place] FDA and  has been authorized for detection and/or diagnosis of SARS-CoV-2 by  FDA under an Emergency Use Authorization (EUA). This EUA will remain  in effect (meaning this test can be used) for the duration of the  Covid-19 declaration under Section 564(b)(1) of the Act, 21  U.S.C. section 360bbb-3(b)(1), unless the authorization is  terminated or revoked. Performed at [facility], [address] [place], [place] [address]          Radiology Studies: DG Chest Portable 1 View  Result Date: 09/13/2020 CLINICAL DATA:  Weakness. EXAM: PORTABLE CHEST 1 VIEW COMPARISON:  August 07, 2020 FINDINGS: Multiple sternal wires are seen. Decreased  lung volumes are noted with mild diffuse chronic appearing increased interstitial lung markings. There is no evidence of focal consolidation, pleural effusion or pneumothorax. Stable moderate to marked severity enlargement of the cardiac silhouette is seen. An artificial cardiac valve is noted. There is a large hiatal hernia. Multilevel degenerative changes seen throughout the thoracic spine. IMPRESSION: 1. Evidence of prior median sternotomy/CABG. 2. Stable moderate to marked severity cardiomegaly. 3. Large hiatal hernia. Electronically Signed   By: Aram Candela M.D.   On: 09/13/2020 16:42        Scheduled Meds: . rOPINIRole  2 mg Oral QID  . traZODone  50 mg Oral QHS   Continuous Infusions: . sodium chloride 100 mL/hr at 09/13/20 1954     LOS: 1 day    Time spent: 33 mins     Charise Killian, MD [facility] Pager [phone]  If 7PM-7AM, please contact night-coverage [email or web address] 09/14/2020, 8:03 AM

## 2020-09-14 NOTE — ED Notes (Signed)
The medication Requip caused the pt to have hallucinations. Gave Requip again, but monitoring to see if pt has hallucinations again.

## 2020-09-14 NOTE — ED Notes (Signed)
Pt requesting water or ice chips. Dr. Mayford Knife notified and states pt to remain NPO until seen by GI consult. Pt given lemon glycerin swabs for mouth.

## 2020-09-15 ENCOUNTER — Encounter
Admission: EM | Disposition: A | Discharge status: Skilled Nursing Facility | Payer: Self-pay | Source: Ambulatory Visit | Attending: Internal Medicine

## 2020-09-15 ENCOUNTER — Encounter: Payer: Self-pay | Admitting: Internal Medicine

## 2020-09-15 DIAGNOSIS — K921 Melena: Secondary | ICD-10-CM | POA: Diagnosis not present

## 2020-09-15 DIAGNOSIS — I482 Chronic atrial fibrillation, unspecified: Secondary | ICD-10-CM | POA: Diagnosis not present

## 2020-09-15 DIAGNOSIS — D649 Anemia, unspecified: Secondary | ICD-10-CM | POA: Diagnosis not present

## 2020-09-15 DIAGNOSIS — D62 Acute posthemorrhagic anemia: Secondary | ICD-10-CM | POA: Diagnosis not present

## 2020-09-15 DIAGNOSIS — N179 Acute kidney failure, unspecified: Secondary | ICD-10-CM | POA: Diagnosis not present

## 2020-09-15 HISTORY — PX: GIVENS CAPSULE STUDY: SHX5432

## 2020-09-15 LAB — CBC
HCT: 27.5 % — ABNORMAL LOW (ref 39.0–52.0)
Hemoglobin: 9.3 g/dL — ABNORMAL LOW (ref 13.0–17.0)
MCH: 29.9 pg (ref 26.0–34.0)
MCHC: 33.8 g/dL (ref 30.0–36.0)
MCV: 88.4 fL (ref 80.0–100.0)
Platelets: 206 10*3/uL (ref 150–400)
RBC: 3.11 MIL/uL — ABNORMAL LOW (ref 4.22–5.81)
RDW: 16.2 % — ABNORMAL HIGH (ref 11.5–15.5)
WBC: 5.4 10*3/uL (ref 4.0–10.5)
nRBC: 0 % (ref 0.0–0.2)

## 2020-09-15 LAB — BASIC METABOLIC PANEL
Anion gap: 9 (ref 5–15)
BUN: 39 mg/dL — ABNORMAL HIGH (ref 8–23)
CO2: 26 mmol/L (ref 22–32)
Calcium: 8.3 mg/dL — ABNORMAL LOW (ref 8.9–10.3)
Chloride: 98 mmol/L (ref 98–111)
Creatinine, Ser: 1.21 mg/dL (ref 0.61–1.24)
GFR, Estimated: 58 mL/min — ABNORMAL LOW (ref >60)
Glucose, Bld: 102 mg/dL — ABNORMAL HIGH (ref 70–99)
Potassium: 3.5 mmol/L (ref 3.5–5.1)
Sodium: 133 mmol/L — ABNORMAL LOW (ref 135–145)

## 2020-09-15 LAB — MAGNESIUM: Magnesium: 2.7 mg/dL — ABNORMAL HIGH (ref 1.7–2.4)

## 2020-09-15 LAB — VITAMIN B12: Vitamin B-12: 515 pg/mL (ref 180–914)

## 2020-09-15 SURGERY — IMAGING PROCEDURE, GI TRACT, INTRALUMINAL, VIA CAPSULE

## 2020-09-15 MED ORDER — MAGNESIUM HYDROXIDE 400 MG/5ML PO SUSP
30.0000 mL | Freq: Once | ORAL | Status: AC
  Administered 2020-09-15: 30 mL via ORAL
  Filled 2020-09-15: qty 30

## 2020-09-15 NOTE — Care Management Important Message (Signed)
Important Message  Patient Details  Name: [name] MRN: [id number] Date of Birth: [birth year]   Medicare Important Message Given:  N/A - LOS <3 / Initial given by admissions  Initial Medicare IM given by Patient Access Associate on 09/14/2020 at 8:02am.   [name] 09/15/2020, 8:39 AM

## 2020-09-15 NOTE — Progress Notes (Signed)
PT Cancellation Note  Patient Details Name: [name] MRN: [id number] DOB: October 17, 1931   Cancelled Treatment:    Reason Eval/Treat Not Completed: Patient not medically ready;Other (comment) (Patient consult received and reviewed. Upon consulting nursing, was told patient is to be undergoing a procedure later today and would be best to be seen after that. Will attempt again at later time/date as available/appropriate.)   Precious Bard, PT, DPT   09/15/2020, 8:37 AM

## 2020-09-15 NOTE — Progress Notes (Signed)
PROGRESS NOTE    [name]  ZOX:[id number] DOB: June 13, 1931 DOA: 09/13/2020 PCP: Gracelyn Nurse, MD    Assessment & Plan:   Principal Problem:   Acute blood loss anemia Active Problems:   Hypertension   Coronary artery disease   S/P AVR   Warfarin anticoagulation   History of mechanical aortic valve replacement   Chronic diastolic CHF (congestive heart failure) (HCC)   AKI (acute kidney injury) (HCC)   Permanent atrial fibrillation (HCC)   History of lower GI bleeding   Symptomatic anemia   Acute blood loss anemia & possible GI bleed: etiology unclear, but on warfarin for mechanical heart valve. Continue to hold warfarin. Hx of lower GI bleed w/ hemorrhagic shock. S/p 1 unit of pRBCs transfused 09/13/20. S/p colonoscopy in 07/2020 which showed diverticulosis & blood in the sigmoid colon. Will continue to monitor H&H. Capsule study of small bowel today as per GI. No bloody bowel movements today so far   Hypotension: secondary to above. Currently stable. Keep MAP >65.  Hypokalemia: WNL today. Will continue to monitor   CAD: continue to hold aspirin, warfarin, BB & nitrates given hypotension & bleeding   Hx of mechanical aortic valve replacement: continue to hold warfarin. Continue on tele   Chronic diastolic CHF: appears euvolemic. Monitor I/Os  AKI: baseline Cr 0.98. Cr continues to trend down daily   Permanent a. fib: continue to hold metoprolol and warfarin. Continue on tele.  RLS: continue on home dose of ropinirole  Depression: severity unknown. Continue on home dose of buspar  DVT prophylaxis: SCDs secondary to above  Code Status: full  Family Communication: Disposition Plan: depends on PT/OT recs  Status is: Inpatient  Remains inpatient appropriate because:Ongoing diagnostic testing needed not appropriate for outpatient work up and IV treatments appropriate due to intensity of illness or inability to take PO   Dispo: The patient is from:  Home              Anticipated d/c is to: SNF              Anticipated d/c date is: 2 days              Patient currently is not medically stable to d/c.    Consultants:   GI    Procedures:   Antimicrobials:    Subjective: Pt c/o malaise   Objective: Vitals:   09/14/20 2300 09/14/20 2330 09/15/20 0003 09/15/20 0418  BP: 107/70 (!) 109/57 (!) 153/76 130/69  Pulse: 81 76 75 72  Resp:   19   Temp:   98 F (36.7 C) 97.6 F (36.4 C)  TempSrc:   Oral Oral  SpO2: 96% 98% 100% 98%  Weight:   94 kg   Height:   5\' 11"  (1.803 m)     Intake/Output Summary (Last 24 hours) at 09/15/2020 0752 Last data filed at 09/15/2020 0400 Gross per 24 hour  Intake 99.36 ml  Output 900 ml  Net -800.64 ml   Filed Weights   09/13/20 1353 09/15/20 0003  Weight: 90.7 kg 94 kg    Examination:  General exam: Appears calm & comfortable. Hard of hearing  Respiratory system: clear breath sounds b/l. No wheezes, rales  Cardiovascular system: S1 & S2+.No  rubs, gallops or clicks.. Gastrointestinal system: Abdomen is obese, soft and nontender. Normal bowel sounds heard. Central nervous system: Alert and oriented. Moves all 4 extremities  Psychiatry: Judgement and insight appear normal. Flat mood and affect  Data Reviewed: I have personally reviewed following labs and imaging studies  CBC: Recent Labs  Lab 09/13/20 1240 09/13/20 1400 09/13/20 1902 09/14/20 0447 09/15/20 0438  WBC 7.5 6.9  --  5.2 5.4  HGB 7.7* 8.3* 7.7* 8.6* 9.3*  HCT 24.1* 25.8* 23.7* 26.2* 27.5*  MCV 90.3 89.9  --  89.4 88.4  PLT 191 216  --  187 206   Basic Metabolic Panel: Recent Labs  Lab 09/13/20 1240 09/13/20 1400 09/14/20 0447 09/15/20 0438  NA 131* 131* 132* 133*  K 3.8 3.6 2.8* 3.5  CL 90* 90* 95* 98  CO2 30 30 26 26   GLUCOSE 101* 122* 93 102*  BUN 48* 49* 46* 39*  CREATININE 1.66* 1.67* 1.36* 1.21  CALCIUM 8.7* 8.6* 7.8* 8.3*  MG  --   --   --  2.7*   GFR: Estimated Creatinine  Clearance: 50.4 mL/min (by C-G formula based on SCr of 1.21 mg/dL). Liver Function Tests: Recent Labs  Lab 09/13/20 1240  AST 20  ALT 10  ALKPHOS 108  BILITOT 0.8  PROT 6.1*  ALBUMIN 3.5   No results for input(s): LIPASE, AMYLASE in the last 168 hours. No results for input(s): AMMONIA in the last 168 hours. Coagulation Profile: Recent Labs  Lab 09/13/20 1128  INR 1.4*   Cardiac Enzymes: No results for input(s): CKTOTAL, CKMB, CKMBINDEX, TROPONINI in the last 168 hours. BNP (last 3 results) No results for input(s): PROBNP in the last 8760 hours. HbA1C: No results for input(s): HGBA1C in the last 72 hours. CBG: No results for input(s): GLUCAP in the last 168 hours. Lipid Profile: No results for input(s): CHOL, HDL, LDLCALC, TRIG, CHOLHDL, LDLDIRECT in the last 72 hours. Thyroid Function Tests: No results for input(s): TSH, T4TOTAL, FREET4, T3FREE, THYROIDAB in the last 72 hours. Anemia Panel: Recent Labs    09/13/20 1400 09/13/20 1902 09/14/20 2125  VITAMINB12  --   --  515  FOLATE 14.8  --   --   FERRITIN 42  --   --   TIBC 442  --   --   IRON 16*  --   --   RETICCTPCT  --  1.7  --    Sepsis Labs: No results for input(s): PROCALCITON, LATICACIDVEN in the last 168 hours.  Recent Results (from the past 240 hour(s))  Respiratory Panel by RT PCR (Flu A&B, Covid) - Nasopharyngeal Swab     Status: None   Collection Time: 09/13/20  5:55 PM   Specimen: Nasopharyngeal Swab  Result Value Ref Range Status   SARS Coronavirus 2 by RT PCR NEGATIVE NEGATIVE Final    Comment: (NOTE) SARS-CoV-2 target nucleic acids are NOT DETECTED.  The SARS-CoV-2 RNA is generally detectable in upper respiratoy specimens during the acute phase of infection. The lowest concentration of SARS-CoV-2 viral copies this assay can detect is 131 copies/mL. A negative result does not preclude SARS-Cov-2 infection and should not be used as the sole basis for treatment or other patient management  decisions. A negative result may occur with  improper specimen collection/handling, submission of specimen other than nasopharyngeal swab, presence of viral mutation(s) within the areas targeted by this assay, and inadequate number of viral copies (<131 copies/mL). A negative result must be combined with clinical observations, patient history, and epidemiological information. The expected result is Negative.  Fact Sheet for Patients:  09/15/20  Fact Sheet for Healthcare Providers:  https://www.moore.com/  This test is no t yet approved or cleared by the https://www.young.biz/  States FDA and  has been authorized for detection and/or diagnosis of SARS-CoV-2 by FDA under an Emergency Use Authorization (EUA). This EUA will remain  in effect (meaning this test can be used) for the duration of the COVID-19 declaration under Section 564(b)(1) of the Act, 21 U.S.C. section 360bbb-3(b)(1), unless the authorization is terminated or revoked sooner.     Influenza A by PCR NEGATIVE NEGATIVE Final   Influenza B by PCR NEGATIVE NEGATIVE Final    Comment: (NOTE) The Xpert Xpress SARS-CoV-2/FLU/RSV assay is intended as an aid in  the diagnosis of influenza from Nasopharyngeal swab specimens and  should not be used as a sole basis for treatment. Nasal washings and  aspirates are unacceptable for Xpert Xpress SARS-CoV-2/FLU/RSV  testing.  Fact Sheet for Patients: https://www.moore.com/  Fact Sheet for Healthcare Providers: https://www.young.biz/  This test is not yet approved or cleared by the [place] FDA and  has been authorized for detection and/or diagnosis of SARS-CoV-2 by  FDA under an Emergency Use Authorization (EUA). This EUA will remain  in effect (meaning this test can be used) for the duration of the  Covid-19 declaration under Section 564(b)(1) of the Act, 21  U.S.C. section 360bbb-3(b)(1), unless the  authorization is  terminated or revoked. Performed at [facility], [address] [place], [place] [address]          Radiology Studies: DG Chest Portable 1 View  Result Date: 09/13/2020 CLINICAL DATA:  Weakness. EXAM: PORTABLE CHEST 1 VIEW COMPARISON:  August 07, 2020 FINDINGS: Multiple sternal wires are seen. Decreased lung volumes are noted with mild diffuse chronic appearing increased interstitial lung markings. There is no evidence of focal consolidation, pleural effusion or pneumothorax. Stable moderate to marked severity enlargement of the cardiac silhouette is seen. An artificial cardiac valve is noted. There is a large hiatal hernia. Multilevel degenerative changes seen throughout the thoracic spine. IMPRESSION: 1. Evidence of prior median sternotomy/CABG. 2. Stable moderate to marked severity cardiomegaly. 3. Large hiatal hernia. Electronically Signed   By: Aram Candela M.D.   On: 09/13/2020 16:42        Scheduled Meds: . busPIRone  5 mg Oral BID  . docusate sodium  200 mg Oral BID  . DULoxetine  30 mg Oral Daily  . rOPINIRole  1 mg Oral Daily  . rOPINIRole  2 mg Oral TID  . traZODone  50 mg Oral QHS   Continuous Infusions: . sodium chloride 75 mL/hr at 09/14/20 2230     LOS: 2 days    Time spent: 31 mins     Charise Killian, MD [facility] Pager [phone]  If 7PM-7AM, please contact night-coverage [email or web address] 09/15/2020, 7:52 AM

## 2020-09-15 NOTE — Evaluation (Signed)
Occupational Therapy Evaluation Patient Details Name: [name] MRN: [id number] DOB: [birth year] Today's Date: 09/15/2020    History of Present Illness Patient is an 86 year old male who presented to ED for weakness and low hemoglobin. PMh includes aortic stenosis s/p mechanical AVR on coumadin, chronic diastolic HF, HTN, CAD, chronic venous stasis, chronic anemia, GERD, C diff, GI hemorrhage (sept 2021), anxiety, bradycardia, hearin glost, RLS   Clinical Impression   [name] was seen for OT evaluation this date. Prior to hospital admission, [name] was MOD I for mobility and ADLs using 4WW and AE, has PCA 5x/week for IADLs. [name] lives alone at [facility] apartment IL[facility]. [name] presents to acute OT demonstrating impaired ADL performance and functional mobility 2/2 decreased safety awareness, functional strength/balance deficits, and decreased activity tolerance. [name] attempted to rise from flat bed, ultimately required HoB elevated and increased time. Upon sitting [name] reports dizziness - vitals stable (BP 134/60, MAP 83, HR 66), resolved c time. [name] required MOD A + RW for sit<>stand at regular bed height and low height toilet - assist for lift off and eccentric control. SUPERVISION for perihygiene using lateral leans (BM light brown colour). SBA doff socks seated EOB, MIN A to don - assist for threading over toes. [name] would benefit from skilled OT to address noted impairments and functional limitations (see below for any additional details) in order to maximize safety and independence while minimizing falls risk and caregiver burden. Upon hospital discharge, recommend STR to maximize [name] safety and return to PLOF.      Follow Up Recommendations  SNF (May progress)    Equipment Recommendations  None recommended by OT    Recommendations for Other Services       Precautions / Restrictions Precautions Precautions: Fall Restrictions Weight Bearing Restrictions: No      Mobility Bed  Mobility Overal bed mobility: Needs Assistance Bed Mobility: Supine to Sit;Sit to Supine     Supine to sit: HOB elevated;Min guard Sit to supine: HOB elevated;Min guard   General bed mobility comments: Attempted to rise from flat bed, ultimately needed HoB elevated but no physical assist    Transfers Overall transfer level: Needs assistance Equipment used: Rolling walker (2 wheeled) Transfers: Sit to/from Stand Sit to Stand: Mod assist      General transfer comment: MOD A from low height toilet - assist for lift off     Balance Overall balance assessment: Needs assistance Sitting-balance support: No upper extremity supported;Feet supported Sitting balance-Leahy Scale: Good     Standing balance support: Bilateral upper extremity supported Standing balance-Leahy Scale: Fair        ADL either performed or assessed with clinical judgement   ADL Overall ADL's : Needs assistance/impaired        General ADL Comments: MOD A for toilet t/f - SUPERVISION for perihygiene using lateral leans. SBA doff socks seated EOB, MIN A to don - assist for threading over toes.                   Pertinent Vitals/Pain Pain Assessment: Faces Faces Pain Scale: Hurts a little bit Pain Location: during BM  Pain Descriptors / Indicators: Grimacing Pain Intervention(s): Limited activity within patient's tolerance     Hand Dominance Right   Extremity/Trunk Assessment Upper Extremity Assessment Upper Extremity Assessment: Generalized weakness   Lower Extremity Assessment Lower Extremity Assessment: Generalized weakness       Communication Communication Communication: HOH   Cognition Arousal/Alertness: Awake/alert Behavior During Therapy:  WFL for tasks assessed/performed Overall Cognitive Status: Within Functional Limits for tasks assessed         General Comments  [name] reports dizziness in sitting - BP 134/60, MAP 83, HR 66    Exercises Exercises: Other exercises Other  Exercises Other Exercises: [name] educated re: OT role, DME recs, d/c recs, falls prevention, ECS Other Exercises: Toileting, LBD, hand hygiene, sup<>sit, sit<>stand x2, sitting/standing balance/tolerance   Shoulder Instructions      Home Living Family/patient expects to be discharged to:: Assisted living Living Arrangements: Alone Available Help at Discharge: Available PRN/intermittently;Personal care attendant;Family (5x/week IADL assist) Type of Home: Apartment Home Access: Level entry     Home Layout: One level     Bathroom Shower/Tub: Producer, television/film/video: Handicapped height (BSC over toilet) Bathroom Accessibility: Yes   Home Equipment: Walker - 4 wheels;Bedside commode;Grab bars - toilet;Grab bars - tub/shower;Wheelchair - power;Adaptive equipment Adaptive Equipment: Sock aid Additional Comments: [name] is resident at [facility] ILF - reports recently ordered a shower seat      Prior Functioning/Environment Level of Independence: Independent with assistive device(s)        Comments:  [name] reports aide comes 5x/week and for varying times (laundry, chores, cooking, etc.); [name] denies fall history.         OT Problem List: Decreased strength;Decreased activity tolerance;Impaired balance (sitting and/or standing);Decreased safety awareness      OT Treatment/Interventions: Self-care/ADL training;Therapeutic exercise;Energy conservation;DME and/or AE instruction;Therapeutic activities;Patient/family education;Balance training    OT Goals(Current goals can be found in the care plan section) Acute Rehab OT Goals Patient Stated Goal: To return home  OT Goal Formulation: With patient Time For Goal Achievement: 09/29/20 Potential to Achieve Goals: Good ADL Goals [name] Will Perform Grooming: with modified independence;standing (c LRAD PRN) [name] Will Perform Lower Body Dressing: sit to/from stand;with modified independence (c LRAD & AE PRN) [name] Will Transfer to Toilet:  with modified independence;ambulating;regular height toilet (c LRAD PRN)  OT Frequency: Min 1X/week   Barriers to D/C: Decreased caregiver support             AM-PAC OT "6 Clicks" Daily Activity     Outcome Measure Help from another person eating meals?: None Help from another person taking care of personal grooming?: A Little Help from another person toileting, which includes using toliet, bedpan, or urinal?: A Little Help from another person bathing (including washing, rinsing, drying)?: A Little Help from another person to put on and taking off regular upper body clothing?: None Help from another person to put on and taking off regular lower body clothing?: A Little 6 Click Score: 20   End of Session Equipment Utilized During Treatment: Gait belt;Rolling walker  Activity Tolerance: Patient tolerated treatment well Patient left: in bed;with call bell/phone within reach;with bed alarm set  OT Visit Diagnosis: Other abnormalities of gait and mobility (R26.89);Muscle weakness (generalized) (M62.81)                Time: 7062-3762 OT Time Calculation (min): 41 min Charges:  OT General Charges $OT Visit: 1 Visit OT Evaluation $OT Eval Moderate Complexity: 1 Mod OT Treatments $Self Care/Home Management : 8-22 mins $Therapeutic Activity: 8-22 mins  Kathie Dike, M.S. OTR/L  09/15/20, 12:25 PM  ascom [phone]

## 2020-09-15 NOTE — Plan of Care (Signed)

## 2020-09-16 ENCOUNTER — Inpatient Hospital Stay: Payer: Medicare Other

## 2020-09-16 ENCOUNTER — Encounter: Payer: Self-pay | Admitting: Internal Medicine

## 2020-09-16 DIAGNOSIS — R195 Other fecal abnormalities: Secondary | ICD-10-CM

## 2020-09-16 DIAGNOSIS — N179 Acute kidney failure, unspecified: Secondary | ICD-10-CM | POA: Diagnosis not present

## 2020-09-16 DIAGNOSIS — D62 Acute posthemorrhagic anemia: Secondary | ICD-10-CM | POA: Diagnosis not present

## 2020-09-16 DIAGNOSIS — D649 Anemia, unspecified: Secondary | ICD-10-CM

## 2020-09-16 DIAGNOSIS — I482 Chronic atrial fibrillation, unspecified: Secondary | ICD-10-CM | POA: Diagnosis not present

## 2020-09-16 LAB — BASIC METABOLIC PANEL
Anion gap: 9 (ref 5–15)
BUN: 25 mg/dL — ABNORMAL HIGH (ref 8–23)
CO2: 26 mmol/L (ref 22–32)
Calcium: 8.6 mg/dL — ABNORMAL LOW (ref 8.9–10.3)
Chloride: 100 mmol/L (ref 98–111)
Creatinine, Ser: 0.91 mg/dL (ref 0.61–1.24)
GFR, Estimated: >60 mL/min (ref >60)
Glucose, Bld: 104 mg/dL — ABNORMAL HIGH (ref 70–99)
Potassium: 3.4 mmol/L — ABNORMAL LOW (ref 3.5–5.1)
Sodium: 135 mmol/L (ref 135–145)

## 2020-09-16 LAB — CBC
HCT: 28.4 % — ABNORMAL LOW (ref 39.0–52.0)
HCT: 28.4 % — ABNORMAL LOW (ref 39.0–52.0)
Hemoglobin: 9.4 g/dL — ABNORMAL LOW (ref 13.0–17.0)
Hemoglobin: 9.1 g/dL — ABNORMAL LOW (ref 13.0–17.0)
MCH: 29.7 pg (ref 26.0–34.0)
MCH: 29.3 pg (ref 26.0–34.0)
MCHC: 33.1 g/dL (ref 30.0–36.0)
MCHC: 32 g/dL (ref 30.0–36.0)
MCV: 89.9 fL (ref 80.0–100.0)
MCV: 91.3 fL (ref 80.0–100.0)
Platelets: 222 10*3/uL (ref 150–400)
Platelets: 246 10*3/uL (ref 150–400)
RBC: 3.16 MIL/uL — ABNORMAL LOW (ref 4.22–5.81)
RBC: 3.11 MIL/uL — ABNORMAL LOW (ref 4.22–5.81)
RDW: 16.5 % — ABNORMAL HIGH (ref 11.5–15.5)
RDW: 16.7 % — ABNORMAL HIGH (ref 11.5–15.5)
WBC: 7.4 10*3/uL (ref 4.0–10.5)
WBC: 7.2 10*3/uL (ref 4.0–10.5)
nRBC: 0 % (ref 0.0–0.2)
nRBC: 0 % (ref 0.0–0.2)

## 2020-09-16 LAB — BRAIN NATRIURETIC PEPTIDE: B Natriuretic Peptide: 469.2 pg/mL — ABNORMAL HIGH (ref 0.0–100.0)

## 2020-09-16 LAB — MAGNESIUM: Magnesium: 2.9 mg/dL — ABNORMAL HIGH (ref 1.7–2.4)

## 2020-09-16 LAB — GLUCOSE, CAPILLARY: Glucose-Capillary: 131 mg/dL — ABNORMAL HIGH (ref 70–99)

## 2020-09-16 LAB — TROPONIN I (HIGH SENSITIVITY): Troponin I (High Sensitivity): 11 ng/L (ref <18)

## 2020-09-16 MED ORDER — BISACODYL 10 MG RE SUPP: 10.0000 mg | Freq: Every day | RECTAL | Status: DC | PRN

## 2020-09-16 MED ORDER — POLYETHYLENE GLYCOL 3350 17 GM/SCOOP PO POWD
1.0000 | Freq: Once | ORAL | Status: AC
  Administered 2020-09-16: 255 g via ORAL
  Filled 2020-09-16 (×2): qty 255

## 2020-09-16 MED ORDER — FUROSEMIDE 10 MG/ML IJ SOLN
40.0000 mg | Freq: Once | INTRAMUSCULAR | Status: AC
  Administered 2020-09-17: 40 mg via INTRAVENOUS
  Filled 2020-09-16: qty 4

## 2020-09-16 MED ORDER — SODIUM CHLORIDE 0.9 % IV SOLN
300.0000 mg | Freq: Once | INTRAVENOUS | Status: AC
  Administered 2020-09-16: 300 mg via INTRAVENOUS
  Filled 2020-09-16: qty 15

## 2020-09-16 MED ORDER — POTASSIUM CHLORIDE CRYS ER 20 MEQ PO TBCR
40.0000 meq | EXTENDED_RELEASE_TABLET | Freq: Once | ORAL | Status: AC
  Administered 2020-09-17: 40 meq via ORAL
  Filled 2020-09-16: qty 2

## 2020-09-16 MED ORDER — POLYETHYLENE GLYCOL 3350 17 G PO PACK
17.0000 g | PACK | Freq: Every day | ORAL | Status: DC
  Administered 2020-09-16 – 2020-09-26 (×9): 17 g via ORAL
  Filled 2020-09-16 (×9): qty 1

## 2020-09-16 NOTE — Progress Notes (Signed)
Cross Cover Note Nurse called reports patient complaint of feeling clammy and short of breath.  VSS. With  sats 100% Oxygen 2L [place] place by RN due to his complaints  Bedside patient endorses he has felt like this off and on all day.  He denies chest pressure or pain. He sates he does feel some better after getting to bedside commode to have bowel movement.  He denies wheezing although expiratory wheeze is noted without  Ned for stethoscope and shortness of breath and wheeze increased with transfer from bedside commode to bed.  Bowel movement brown and without evidence of bleeding.   EKG - A fib, v  rate 95 Chest xray - mild chf  Troponin - normal BNP - elevated AKI nited resolved with creatinine normal range on am labs  IV fluids were discontinued.  Lasix given

## 2020-09-16 NOTE — Progress Notes (Signed)
VCE results Interpretation: Capsule reach cecum, normal small bowel transit, studies complete Copious amounts of coffee-ground liquid in the entire small bowel and likely small bowel erosions but no clear source like AVMs identified. His colon is filled with very dark liquid representing old blood. With history of obscure GI bleed and worsening of anemia, negative bidirectional endoscopy, recommend enterscopy tomorrow  I have discussed over phone with patient's daughter who is agreeable with the procedure  Recommend MiraLAX bowel prep Clear liquid diet only N.p.o. effective a.m. Continue to hold Coumadin Plan for enteroscopy tomorrow  Arlyss Repress, MD [address]  [address]  [place], [place] [address]  Main: [phone]  Fax: [phone] Pager: [phone]

## 2020-09-16 NOTE — Evaluation (Signed)
Physical Therapy Evaluation Patient Details Name: [name] MRN: [id number] DOB: 05/24/1931 Today's Date: 09/16/2020   History of Present Illness  Patient is an 86 year old male who presented to ED for weakness and low hemoglobin; admitted for management of GIB with symptomatic anemia. PMh includes aortic stenosis s/p mechanical AVR on coumadin, chronic diastolic HF, HTN, CAD, chronic venous stasis, chronic anemia, GERD, C diff, GI hemorrhage (sept 2021), anxiety, bradycardia, hearin glost, RLS  Clinical Impression  Upon evaluation, patient alert and oriented to basic information; follows commands and agreeable for participation with session.  Noted with spilled soup over gown/linen in bed; assisted with clean up, linen and clothing change as needed.  Patient generally weak and deconditioned; moderately dyspneic with functional activities (though sats >90% on RA throughout).  Currently requiring min assist for bed mobility; min/mod assist for sit/stand and basic transfers; min assist for gait (25') with RW.  Demonstrates decreased step height/length, heavy WBing bilat UEs on RW; slow and deliberate, but no overt buckling or LOB. Mod SOB with exertion; sats >90% on RA. Would benefit from skilled PT to address above deficits and promote optimal return to PLOF.; recommend transition to STR upon discharge from acute hospitalization. Will monitor progress and update DC recs as appropriate.     Follow Up Recommendations SNF    Equipment Recommendations  Rolling walker with 5" wheels    Recommendations for Other Services       Precautions / Restrictions Precautions Precautions: Fall Restrictions Weight Bearing Restrictions: No      Mobility  Bed Mobility Overal bed mobility: Needs Assistance Bed Mobility: Supine to Sit     Supine to sit: Min guard     General bed mobility comments: increased time, heavy use of bedrails to complete    Transfers Overall transfer level:  Needs assistance Equipment used: Rolling walker (2 wheeled) Transfers: Sit to/from Stand Sit to Stand: Min assist            Ambulation/Gait Ambulation/Gait assistance: Min guard;Min assist Gait Distance (Feet): 25 Feet Assistive device: Rolling walker (2 wheeled)       General Gait Details: decreased step height/length, heavy WBing bilat UEs on RW; slow and deliberate, but no overt buckling or LOB. Mod SOB with exertion; sats >90% on RA.  Stairs            Wheelchair Mobility    Modified Rankin (Stroke Patients Only)       Balance Overall balance assessment: Needs assistance Sitting-balance support: Feet supported;No upper extremity supported Sitting balance-Leahy Scale: Good     Standing balance support: Bilateral upper extremity supported Standing balance-Leahy Scale: Fair                               Pertinent Vitals/Pain Pain Assessment: No/denies pain    Home Living Family/patient expects to be discharged to:: Assisted living Living Arrangements: Alone Available Help at Discharge: Available PRN/intermittently;Personal care attendant;Family Type of Home: Apartment Home Access: Level entry     Home Layout: One level Home Equipment: Walker - 4 wheels;Bedside commode;Grab bars - toilet;Grab bars - tub/shower;Wheelchair - power;Adaptive equipment Additional Comments: Pt is resident at [facility] - reports recently ordered a shower seat    Prior Function Level of Independence: Independent with assistive device(s)         Comments: Ambulatory with 4WRW for household distances; uses wife's power scooter for longer-distances.  Pt reports aide  comes 5x/week and for varying times (laundry, chores, cooking, etc.); pt denies fall history.  Was actively participation with HHPT prior to admission.     Hand Dominance        Extremity/Trunk Assessment   Upper Extremity Assessment Upper Extremity Assessment: Generalized  weakness    Lower Extremity Assessment Lower Extremity Assessment: Generalized weakness (grossly 4-/5 throughout)       Communication   Communication: HOH  Cognition Arousal/Alertness: Awake/alert Behavior During Therapy: WFL for tasks assessed/performed Overall Cognitive Status: Within Functional Limits for tasks assessed                                        General Comments      Exercises Other Exercises Other Exercises: Educated in role of PT, progressive mobility and safety with functional mobility; patient voiced understanding. Other Exercises: Donning undergarments and shorts, min assist to thread; min assist for standing balance to pull pants over hips. Other Exercises: Sit/stand x2 with RW, min assist for lift off, standing balance   Assessment/Plan    PT Assessment Patient needs continued PT services  PT Problem List Decreased strength;Decreased activity tolerance;Decreased balance;Decreased mobility;Decreased knowledge of use of DME;Decreased safety awareness;Decreased knowledge of precautions;Cardiopulmonary status limiting activity       PT Treatment Interventions DME instruction;Therapeutic exercise;Gait training;Functional mobility training;Therapeutic activities;Balance training;Patient/family education    PT Goals (Current goals can be found in the Care Plan section)  Acute Rehab PT Goals Patient Stated Goal: To return home  PT Goal Formulation: With patient Time For Goal Achievement: 09/30/20 Potential to Achieve Goals: Good    Frequency Min 2X/week   Barriers to discharge Decreased caregiver support      Co-evaluation               AM-PAC PT "6 Clicks" Mobility  Outcome Measure Help needed turning from your back to your side while in a flat bed without using bedrails?: None Help needed moving from lying on your back to sitting on the side of a flat bed without using bedrails?: A Little Help needed moving to and from a bed  to a chair (including a wheelchair)?: A Little Help needed standing up from a chair using your arms (e.g., wheelchair or bedside chair)?: A Little Help needed to walk in hospital room?: A Little Help needed climbing 3-5 steps with a railing? : A Lot 6 Click Score: 18    End of Session Equipment Utilized During Treatment: Gait belt Activity Tolerance: Patient tolerated treatment well Patient left: in chair;with call bell/phone within reach;with family/visitor present (alarm pad in place; box not available. CNA to locate/place as available.) Nurse Communication: Mobility status PT Visit Diagnosis: Muscle weakness (generalized) (M62.81);Difficulty in walking, not elsewhere classified (R26.2)    Time: 8250-0370 PT Time Calculation (min) (ACUTE ONLY): 31 min   Charges:   PT Evaluation $PT Eval Moderate Complexity: 1 Mod PT Treatments $Therapeutic Activity: 8-22 mins      Zurii Hewes H. Manson Passey, PT, DPT, NCS 09/16/20, 2:34 PM [phone]

## 2020-09-16 NOTE — Progress Notes (Signed)
PROGRESS NOTE    [name]  GGE:[id number] DOB: December 12, 1930 DOA: 09/13/2020 PCP: Gracelyn Nurse, MD    Assessment & Plan:   Principal Problem:   Acute blood loss anemia Active Problems:   Hypertension   Coronary artery disease   S/P AVR   Warfarin anticoagulation   History of mechanical aortic valve replacement   Chronic diastolic CHF (congestive heart failure) (HCC)   AKI (acute kidney injury) (HCC)   Permanent atrial fibrillation (HCC)   History of lower GI bleeding   Symptomatic anemia   Acute blood loss anemia & possible GI bleed: etiology unclear, but on warfarin for mechanical heart valve. Continue to hold warfarin. Hx of lower GI bleed w/ hemorrhagic shock. S/p 1 unit of pRBCs transfused 09/13/20. S/p colonoscopy in 07/2020 which showed diverticulosis & blood in the sigmoid colon. Will continue to monitor H&H. S/p capsule study 09/15/20, awaiting results.   Hypotension: secondary to above. Currently stable. Keep MAP >65.  Hypokalemia: KCl repleted. Will continue to monitor   CAD: continue to hold aspirin, warfarin, BB & nitrates given hypotension & bleeding   Hx of mechanical aortic valve replacement: continue to hold warfarin. Continue on tele   Chronic diastolic CHF: appears euvolemic. Monitor I/Os  AKI: resolved  Permanent a. fib: continue to hold metoprolol & wafarin. Continue on tele   RLS: continue on home dose of ropinirole  Depression: severity unknown. Continue on home dose of buspar  DVT prophylaxis: SCDs secondary to above  Code Status: full  Family Communication: Disposition Plan:  SNF vs HH. Pt wants to think this over   Status is: Inpatient  Remains inpatient appropriate because:Ongoing diagnostic testing needed not appropriate for outpatient work up and IV treatments appropriate due to intensity of illness or inability to take PO, awaiting capsule study results    Dispo: The patient is from: Home              Anticipated d/c  is to: SNF vs home health               Anticipated d/c date is: 2 days              Patient currently is not medically stable to d/c.    Consultants:   GI    Procedures:   Antimicrobials:    Subjective: Pt c/o fatigue   Objective: Vitals:   09/15/20 1554 09/15/20 1940 09/16/20 0428 09/16/20 0521  BP: (!) 149/75 (!) 156/99 (!) 143/65   Pulse: 76 88 76   Resp: 18 17    Temp: 97.6 F (36.4 C) 97.6 F (36.4 C) 97.6 F (36.4 C)   TempSrc: Oral Oral Oral   SpO2: 99% 100% 96%   Weight:    95.2 kg  Height:        Intake/Output Summary (Last 24 hours) at 09/16/2020 0742 Last data filed at 09/15/2020 2219 Gross per 24 hour  Intake 990 ml  Output 351 ml  Net 639 ml   Filed Weights   09/13/20 1353 09/15/20 0003 09/16/20 0521  Weight: 90.7 kg 94 kg 95.2 kg    Examination:  General exam: Appears calm & comfortable. Hard of hearing  Respiratory system: clear breath sounds b/l. No wheezes or rales  Cardiovascular system: irregularly irregular. No rubs or gallops  Gastrointestinal system: Abdomen is obese, soft and nontender. Normal bowel sounds heard. Central nervous system: alert and oriented. Moves all 4 extremities   Psychiatry: Judgement and insight appear normal. Flat mood  and affect      Data Reviewed: I have personally reviewed following labs and imaging studies  CBC: Recent Labs  Lab 09/13/20 1240 09/13/20 1240 09/13/20 1400 09/13/20 1902 09/14/20 0447 09/15/20 0438 09/16/20 0542  WBC 7.5  --  6.9  --  5.2 5.4 7.4  HGB 7.7*   < > 8.3* 7.7* 8.6* 9.3* 9.4*  HCT 24.1*   < > 25.8* 23.7* 26.2* 27.5* 28.4*  MCV 90.3  --  89.9  --  89.4 88.4 89.9  PLT 191  --  216  --  187 206 222   < > = values in this interval not displayed.   Basic Metabolic Panel: Recent Labs  Lab 09/13/20 1240 09/13/20 1400 09/14/20 0447 09/15/20 0438 09/16/20 0542  NA 131* 131* 132* 133* 135  K 3.8 3.6 2.8* 3.5 3.4*  CL 90* 90* 95* 98 100  CO2 30 30 26 26 26   GLUCOSE  101* 122* 93 102* 104*  BUN 48* 49* 46* 39* 25*  CREATININE 1.66* 1.67* 1.36* 1.21 0.91  CALCIUM 8.7* 8.6* 7.8* 8.3* 8.6*  MG  --   --   --  2.7* 2.9*   GFR: Estimated Creatinine Clearance: 67.4 mL/min (by C-G formula based on SCr of 0.91 mg/dL). Liver Function Tests: Recent Labs  Lab 09/13/20 1240  AST 20  ALT 10  ALKPHOS 108  BILITOT 0.8  PROT 6.1*  ALBUMIN 3.5   No results for input(s): LIPASE, AMYLASE in the last 168 hours. No results for input(s): AMMONIA in the last 168 hours. Coagulation Profile: Recent Labs  Lab 09/13/20 1128  INR 1.4*   Cardiac Enzymes: No results for input(s): CKTOTAL, CKMB, CKMBINDEX, TROPONINI in the last 168 hours. BNP (last 3 results) No results for input(s): PROBNP in the last 8760 hours. HbA1C: No results for input(s): HGBA1C in the last 72 hours. CBG: No results for input(s): GLUCAP in the last 168 hours. Lipid Profile: No results for input(s): CHOL, HDL, LDLCALC, TRIG, CHOLHDL, LDLDIRECT in the last 72 hours. Thyroid Function Tests: No results for input(s): TSH, T4TOTAL, FREET4, T3FREE, THYROIDAB in the last 72 hours. Anemia Panel: Recent Labs    09/13/20 1400 09/13/20 1902 09/14/20 2125  VITAMINB12  --   --  515  FOLATE 14.8  --   --   FERRITIN 42  --   --   TIBC 442  --   --   IRON 16*  --   --   RETICCTPCT  --  1.7  --    Sepsis Labs: No results for input(s): PROCALCITON, LATICACIDVEN in the last 168 hours.  Recent Results (from the past 240 hour(s))  Respiratory Panel by RT PCR (Flu A&B, Covid) - Nasopharyngeal Swab     Status: None   Collection Time: 09/13/20  5:55 PM   Specimen: Nasopharyngeal Swab  Result Value Ref Range Status   SARS Coronavirus 2 by RT PCR NEGATIVE NEGATIVE Final    Comment: (NOTE) SARS-CoV-2 target nucleic acids are NOT DETECTED.  The SARS-CoV-2 RNA is generally detectable in upper respiratoy specimens during the acute phase of infection. The lowest concentration of SARS-CoV-2 viral copies  this assay can detect is 131 copies/mL. A negative result does not preclude SARS-Cov-2 infection and should not be used as the sole basis for treatment or other patient management decisions. A negative result may occur with  improper specimen collection/handling, submission of specimen other than nasopharyngeal swab, presence of viral mutation(s) within the areas targeted by this assay,  and inadequate number of viral copies (<131 copies/mL). A negative result must be combined with clinical observations, patient history, and epidemiological information. The expected result is Negative.  Fact Sheet for Patients:  https://www.moore.com/  Fact Sheet for Healthcare Providers:  https://www.young.biz/  This test is no t yet approved or cleared by the [place] FDA and  has been authorized for detection and/or diagnosis of SARS-CoV-2 by FDA under an Emergency Use Authorization (EUA). This EUA will remain  in effect (meaning this test can be used) for the duration of the COVID-19 declaration under Section 564(b)(1) of the Act, 21 U.S.C. section 360bbb-3(b)(1), unless the authorization is terminated or revoked sooner.     Influenza A by PCR NEGATIVE NEGATIVE Final   Influenza B by PCR NEGATIVE NEGATIVE Final    Comment: (NOTE) The Xpert Xpress SARS-CoV-2/FLU/RSV assay is intended as an aid in  the diagnosis of influenza from Nasopharyngeal swab specimens and  should not be used as a sole basis for treatment. Nasal washings and  aspirates are unacceptable for Xpert Xpress SARS-CoV-2/FLU/RSV  testing.  Fact Sheet for Patients: https://www.moore.com/  Fact Sheet for Healthcare Providers: https://www.young.biz/  This test is not yet approved or cleared by the [place] FDA and  has been authorized for detection and/or diagnosis of SARS-CoV-2 by  FDA under an Emergency Use Authorization (EUA). This EUA  will remain  in effect (meaning this test can be used) for the duration of the  Covid-19 declaration under Section 564(b)(1) of the Act, 21  U.S.C. section 360bbb-3(b)(1), unless the authorization is  terminated or revoked. Performed at [facility], [address], [place], [place] [address]          Radiology Studies: No results found.      Scheduled Meds: . busPIRone  5 mg Oral BID  . docusate sodium  200 mg Oral BID  . DULoxetine  30 mg Oral Daily  . polyethylene glycol  17 g Oral Daily  . rOPINIRole  1 mg Oral Daily  . rOPINIRole  2 mg Oral TID  . traZODone  50 mg Oral QHS   Continuous Infusions: . sodium chloride 75 mL/hr at 09/15/20 1243     LOS: 3 days    Time spent: 32 mins     Charise Killian, MD [facility] Pager [phone]  If 7PM-7AM, please contact night-coverage [email or web address] 09/16/2020, 7:42 AM

## 2020-09-17 ENCOUNTER — Encounter
Admission: EM | Disposition: A | Discharge status: Skilled Nursing Facility | Payer: Self-pay | Source: Ambulatory Visit | Attending: Internal Medicine

## 2020-09-17 ENCOUNTER — Encounter: Payer: Self-pay | Admitting: Internal Medicine

## 2020-09-17 ENCOUNTER — Inpatient Hospital Stay: Payer: Medicare Other | Admitting: Anesthesiology

## 2020-09-17 DIAGNOSIS — D649 Anemia, unspecified: Secondary | ICD-10-CM | POA: Diagnosis not present

## 2020-09-17 DIAGNOSIS — I482 Chronic atrial fibrillation, unspecified: Secondary | ICD-10-CM | POA: Diagnosis not present

## 2020-09-17 DIAGNOSIS — D62 Acute posthemorrhagic anemia: Secondary | ICD-10-CM | POA: Diagnosis not present

## 2020-09-17 DIAGNOSIS — N179 Acute kidney failure, unspecified: Secondary | ICD-10-CM | POA: Diagnosis not present

## 2020-09-17 HISTORY — PX: ENTEROSCOPY: SHX5533

## 2020-09-17 LAB — CBC
HCT: 28.8 % — ABNORMAL LOW (ref 39.0–52.0)
Hemoglobin: 9.4 g/dL — ABNORMAL LOW (ref 13.0–17.0)
MCH: 29.7 pg (ref 26.0–34.0)
MCHC: 32.6 g/dL (ref 30.0–36.0)
MCV: 91.1 fL (ref 80.0–100.0)
Platelets: 227 10*3/uL (ref 150–400)
RBC: 3.16 MIL/uL — ABNORMAL LOW (ref 4.22–5.81)
RDW: 16.6 % — ABNORMAL HIGH (ref 11.5–15.5)
WBC: 7.2 10*3/uL (ref 4.0–10.5)
nRBC: 0 % (ref 0.0–0.2)

## 2020-09-17 LAB — BASIC METABOLIC PANEL
Anion gap: 8 (ref 5–15)
BUN: 17 mg/dL (ref 8–23)
CO2: 26 mmol/L (ref 22–32)
Calcium: 8.5 mg/dL — ABNORMAL LOW (ref 8.9–10.3)
Chloride: 100 mmol/L (ref 98–111)
Creatinine, Ser: 0.79 mg/dL (ref 0.61–1.24)
GFR, Estimated: >60 mL/min (ref >60)
Glucose, Bld: 99 mg/dL (ref 70–99)
Potassium: 3.5 mmol/L (ref 3.5–5.1)
Sodium: 134 mmol/L — ABNORMAL LOW (ref 135–145)

## 2020-09-17 LAB — TROPONIN I (HIGH SENSITIVITY): Troponin I (High Sensitivity): 13 ng/L (ref <18)

## 2020-09-17 LAB — MAGNESIUM: Magnesium: 2.7 mg/dL — ABNORMAL HIGH (ref 1.7–2.4)

## 2020-09-17 SURGERY — ENTEROSCOPY
Anesthesia: General

## 2020-09-17 MED ORDER — PHENYLEPHRINE HCL (PRESSORS) 10 MG/ML IV SOLN
INTRAVENOUS | Status: DC | PRN
  Administered 2020-09-17 (×2): 100 ug via INTRAVENOUS

## 2020-09-17 MED ORDER — SODIUM CHLORIDE 0.9 % IV SOLN: INTRAVENOUS | Status: DC

## 2020-09-17 MED ORDER — METOPROLOL SUCCINATE ER 25 MG PO TB24
12.5000 mg | ORAL_TABLET | Freq: Every day | ORAL | Status: DC
  Administered 2020-09-17 – 2020-09-26 (×10): 12.5 mg via ORAL
  Filled 2020-09-17: qty 0.5
  Filled 2020-09-17 (×6): qty 1
  Filled 2020-09-17: qty 0.5
  Filled 2020-09-17 (×2): qty 1
  Filled 2020-09-17: qty 0.5

## 2020-09-17 MED ORDER — IPRATROPIUM-ALBUTEROL 0.5-2.5 (3) MG/3ML IN SOLN
RESPIRATORY_TRACT | Status: AC
  Administered 2020-09-17: 3 mL via RESPIRATORY_TRACT
  Filled 2020-09-17: qty 3

## 2020-09-17 MED ORDER — PROPOFOL 500 MG/50ML IV EMUL
INTRAVENOUS | Status: DC | PRN
  Administered 2020-09-17: 145 ug/kg/min via INTRAVENOUS

## 2020-09-17 MED ORDER — FUROSEMIDE 10 MG/ML IJ SOLN
40.0000 mg | Freq: Once | INTRAMUSCULAR | Status: AC
  Administered 2020-09-17: 40 mg via INTRAVENOUS
  Filled 2020-09-17: qty 4

## 2020-09-17 MED ORDER — PROPOFOL 10 MG/ML IV BOLUS
INTRAVENOUS | Status: DC | PRN
  Administered 2020-09-17: 40 mg via INTRAVENOUS

## 2020-09-17 MED ORDER — FENTANYL CITRATE (PF) 100 MCG/2ML IJ SOLN: 25.0000 ug | INTRAMUSCULAR | Status: DC | PRN

## 2020-09-17 MED ORDER — IPRATROPIUM-ALBUTEROL 0.5-2.5 (3) MG/3ML IN SOLN: 3.0000 mL | Freq: Once | RESPIRATORY_TRACT | Status: AC

## 2020-09-17 MED ORDER — GLYCOPYRROLATE 0.2 MG/ML IJ SOLN
INTRAMUSCULAR | Status: DC | PRN
  Administered 2020-09-17: .2 mg via INTRAVENOUS

## 2020-09-17 MED ORDER — LIDOCAINE HCL (CARDIAC) PF 100 MG/5ML IV SOSY
PREFILLED_SYRINGE | INTRAVENOUS | Status: DC | PRN
  Administered 2020-09-17: 100 mg via INTRAVENOUS

## 2020-09-17 MED ORDER — ONDANSETRON HCL 4 MG/2ML IJ SOLN: 4.0000 mg | Freq: Once | INTRAMUSCULAR | Status: DC | PRN

## 2020-09-17 MED ORDER — PANTOPRAZOLE SODIUM 40 MG PO TBEC
40.0000 mg | DELAYED_RELEASE_TABLET | Freq: Two times a day (BID) | ORAL | Status: DC
  Administered 2020-09-17 – 2020-09-26 (×17): 40 mg via ORAL
  Filled 2020-09-17 (×17): qty 1

## 2020-09-17 NOTE — Transfer of Care (Signed)
Immediate Anesthesia Transfer of Care Note  Patient: [name]  Procedure(s) Performed: ENTEROSCOPY (N/A )  Patient Location: Endoscopy Unit  Anesthesia Type:General  Level of Consciousness: awake, alert , oriented and patient cooperative  Airway & Oxygen Therapy: Patient Spontanous Breathing and Patient connected to face mask oxygen  Post-op Assessment: Report given to RN and Post -op Vital signs reviewed and stable  Post vital signs: Reviewed and stable  Last Vitals:  Vitals Value Taken Time  BP 97/71 09/17/20 0939  Temp 36.8 C 09/17/20 0939  Pulse 86 09/17/20 0947  Resp 12 09/17/20 0947  SpO2 93 % 09/17/20 0947  Vitals shown include unvalidated device data.  Last Pain:  Vitals:   09/17/20 0939  TempSrc:   PainSc: 0-No pain         Complications: No complications documented.

## 2020-09-17 NOTE — Anesthesia Procedure Notes (Signed)
Procedure Name: General with mask airway Performed by: Mohammed Kindle, CRNA Pre-anesthesia Checklist: Patient identified, Emergency Drugs available, Suction available and Patient being monitored Oxygen Delivery Method: Simple face mask Induction Type: IV induction Placement Confirmation: CO2 detector and positive ETCO2 Dental Injury: Teeth and Oropharynx as per pre-operative assessment

## 2020-09-17 NOTE — Anesthesia Preprocedure Evaluation (Addendum)
Anesthesia Evaluation  Patient identified by MRN, date of birth, ID band Patient awake    Reviewed: Allergy & Precautions, H&P , NPO status , reviewed documented beta blocker date and time   Airway Mallampati: III  TM Distance: >3 FB Neck ROM: full    Dental  (+) Poor Dentition, Missing   Pulmonary sleep apnea , former smoker,    Pulmonary exam normal        Cardiovascular hypertension, + CAD and +CHF  Normal cardiovascular exam+ dysrhythmias Atrial Fibrillation   01/2020 ECHO IMPRESSIONS    1. Left ventricular ejection fraction, by estimation, is 55 to 60%. The  left ventricle has normal function. The left ventricle has no regional  wall motion abnormalities. Left ventricular diastolic function could not  be evaluated.  2. Right ventricular systolic function is normal. The right ventricular  size is mildly enlarged.  3. Left atrial size was severely dilated.  4. Right atrial size was severely dilated.  5. The mitral valve is degenerative. Mild mitral valve regurgitation.  6. Tricuspid valve regurgitation is moderate to severe.  7. The aortic valve has been repaired/replaced. Aortic valve  regurgitation is not visualized. There is a mechanical valve present in  the aortic position. Procedure Date: 2002. Echo findings are consistent  with normal structure and function of the aortic  valve prosthesis.    Neuro/Psych PSYCHIATRIC DISORDERS Anxiety Depression    GI/Hepatic PUD, GERD  ,  Endo/Other    Renal/GU Renal disease     Musculoskeletal  (+) Arthritis ,   Abdominal   Peds  Hematology  (+) Blood dyscrasia, anemia ,   Anesthesia Other Findings Past Medical History: 10/11: Anxiety No date: Aortic stenosis     Comment:  a. s/p mechcanical AVR, 2002; b. 10/2018 Echo: Triv AI,               mean grad . No date: Bradycardia     Comment:  chronic, no symptoms 07/2010 No date: C. difficile  colitis No date: Carotid bruit     Comment:  dopplers in past, no abnormalities No date: Chronic diastolic CHF (congestive heart failure) (HCC)     Comment:  a. Echo 03/2015: EF 60-65%, no RWMA, GR1DD, mild BAE,               mild to mod MR, mod TR, PASP 65 mmHg; b. 01/2017 Echo: EF               55-60%, NRWMA, grade 1 diastolic dysfunction.  Normal               functioning prosthetic aortic valve.  Mean gradient 50               mmHg.  Sev TR. PASP ; c. 10/2018 Echo: EF 55-60%,               Triv AI, mod dil LA, mod-sev TR, PASP 35-40, mild to mod               red RV fxn. No date: Coronary artery disease     Comment:  a. mild, cath, 08/2010; b. medically managed No date: Decreased hearing     Comment:  Right ear No date: Depression No date: Gastric ulcer No date: GERD (gastroesophageal reflux disease) No date: Hypertension     Comment:  BP higher than usual 04/19/10; amlodipine increased by               telephone No date:  Mod-Sev Tricuspid regurgitation     Comment:  a. 10/2018 Echo: Mod-Sev TR, PASP 35-81mmHg. 08/23/2015: RLS (restless legs syndrome) No date: S/P AVR     Comment:  a. [employer]. mechanical 2002; b. echo 08/2010 EF 60%,               trival AI, mild MR, AVR working well; c. on longterm               warfarin tx 10/11: SOB (shortness of breath)     Comment:  08/2010,Episodes at 5 AM, eventually felt to be anxiety,              after complete workup including catheterization, pt               greatly improved with anxiety meds 11/11  Past Surgical History: No date: CARDIAC CATHETERIZATION 04/05/2015: ESOPHAGOGASTRODUODENOSCOPY; N/A     Comment:  Procedure: ESOPHAGOGASTRODUODENOSCOPY (EGD);  Surgeon:               Scot Jun, MD;  Location: [facility] ENDOSCOPY;                Service: Endoscopy;  Laterality: N/A; 04/17/2015: ESOPHAGOGASTRODUODENOSCOPY; N/A     Comment:  Procedure: ESOPHAGOGASTRODUODENOSCOPY (EGD);  Surgeon:               Scot Jun, MD;   Location: [facility] ENDOSCOPY;                Service: Endoscopy;  Laterality: N/A; 08/02/2015: ESOPHAGOGASTRODUODENOSCOPY; N/A     Comment:  Procedure: ESOPHAGOGASTRODUODENOSCOPY (EGD);  Surgeon:               Wallace Cullens, MD;  Location: [facility] ENDOSCOPY;  Service:               Endoscopy;  Laterality: N/A; 07/30/2020: ESOPHAGOGASTRODUODENOSCOPY; N/A     Comment:  Procedure: ESOPHAGOGASTRODUODENOSCOPY (EGD);  Surgeon:               Tol[place], Boykin Nearing, MD;  Location: [facility] ENDOSCOPY;                Service: Gastroenterology;  Laterality: N/A; No date: HERNIA REPAIR No date: JOINT REPLACEMENT No date: TOTAL HIP ARTHROPLASTY 1/02: VALVE REPLACEMENT     Comment:  Aortic; echo 3/09 valve working well; echo 10/11 working              well; put on Coumadin  BMI    Body Mass Index: 28.35 kg/m      Reproductive/Obstetrics                            Anesthesia Physical  Anesthesia Plan  ASA: IV  Anesthesia Plan: General   Post-op Pain Management:    Induction: Intravenous  PONV Risk Score and Plan: Treatment may vary due to age or medical condition and TIVA  Airway Management Planned: Nasal Cannula and Natural Airway  Additional Equipment:   Intra-op Plan:   Post-operative Plan:   Informed Consent: I have reviewed the patients History and Physical, chart, labs and discussed the procedure including the risks, benefits and alternatives for the proposed anesthesia with the patient or authorized representative who has indicated his/her understanding and acceptance.     Dental Advisory Given  Plan Discussed with: CRNA  Anesthesia Plan Comments: (Pt tolerated EGD recently, expect to tolerate colonoscopy today. Discussed with daughter, who wishes to proceed)  Anesthesia Quick Evaluation  

## 2020-09-17 NOTE — Progress Notes (Signed)
PROGRESS NOTE    [name]  GLO:[id number] DOB: 1931-01-28 DOA: 09/13/2020 PCP: Gracelyn Nurse, MD    Assessment & Plan:   Principal Problem:   Acute blood loss anemia Active Problems:   Hypertension   Coronary artery disease   S/P AVR   Warfarin anticoagulation   History of mechanical aortic valve replacement   Chronic diastolic CHF (congestive heart failure) (HCC)   AKI (acute kidney injury) (HCC)   Permanent atrial fibrillation (HCC)   History of lower GI bleeding   Symptomatic anemia   Acute blood loss anemia & possible GI bleed: etiology unclear, but on warfarin for mechanical heart valve. Continue to hold warfarin. Hx of lower GI bleed w/ hemorrhagic shock. S/p 1 unit of pRBCs transfused 09/13/20. S/p colonoscopy in 07/2020 which showed diverticulosis & blood in the sigmoid colon. Will continue to monitor H&H. S/p capsule study 09/15/20, which shows copious amounts of coffee ground liquid in entire small bowel and likely small bowel erosions but no clear source like AVMs identified. S/p small bowel enteroscopy 09/17/20 which showed non bleeding duodenal ulcers w/ a clean ulcer base & recently bleeding erosive duodenopathy that was treated w/ argon beam coagulation.   Hypotension: resolved  Hypokalemia: WNL today. Will continue to monitor   CAD: continue to aspirin, warfarin, BB & nitrates given bleeding  Hx of mechanical aortic valve replacement: continue to hold warfarin. Continue on tele   Chronic diastolic CHF: IV lasix x1. Monitor I/Os  AKI: resolved  Permanent a. fib: continue to hold metoprolol & wafarin. Continue on tele   RLS: continue on home dose of ropinirole   Depression: severity unknown. Continue on home dose of buspar   DVT prophylaxis: SCDs secondary to above  Code Status: full  Family Communication: discussed pt's care w/ pt's son and answered his questions  Disposition Plan:  Pt is agreeable to SNF today   Status is:  Inpatient  Remains inpatient appropriate because:Ongoing diagnostic testing needed not appropriate for outpatient work up and IV treatments appropriate due to intensity of illness or inability to take PO, will need SNF placement    Dispo: The patient is from: Home              Anticipated d/c is to: SNF               Anticipated d/c date is: 2 days - 3 days               Patient currently is not medically stable to d/c.    Consultants:   GI    Procedures:   Antimicrobials:    Subjective: Pt c/o intermittent shortness of breath.   Objective: Vitals:   09/16/20 2115 09/17/20 0009 09/17/20 0044 09/17/20 0346  BP: (!) 150/74  (!) 156/75 137/67  Pulse: 86  81 85  Resp: (!) 24 18 20 18   Temp: 97.6 F (36.4 C)   97.7 F (36.5 C)  TempSrc: Oral     SpO2: 100% 95%  94%  Weight:    96.6 kg  Height:        Intake/Output Summary (Last 24 hours) at 09/17/2020 0801 Last data filed at 09/17/2020 0600 Gross per 24 hour  Intake 1926.95 ml  Output 600 ml  Net 1326.95 ml   Filed Weights   09/15/20 0003 09/16/20 0521 09/17/20 0346  Weight: 94 kg 95.2 kg 96.6 kg    Examination:  General exam: Appears calm and comfortable. Hard of hearing  Respiratory  system: diminished breath sounds b/l  Cardiovascular system: irregularly irregular. No rubs or gallops  Gastrointestinal system: Abdomen is obese, soft and nontender. Normal bowel sounds heard. Central nervous system: alert and oriented. Moves all 4 extremities   Psychiatry: Judgement and insight appear normal. Flat mood and affect      Data Reviewed: I have personally reviewed following labs and imaging studies  CBC: Recent Labs  Lab 09/14/20 0447 09/15/20 0438 09/16/20 0542 09/16/20 2219 09/17/20 0517  WBC 5.2 5.4 7.4 7.2 7.2  HGB 8.6* 9.3* 9.4* 9.1* 9.4*  HCT 26.2* 27.5* 28.4* 28.4* 28.8*  MCV 89.4 88.4 89.9 91.3 91.1  PLT 187 206 222 246 227   Basic Metabolic Panel: Recent Labs  Lab 09/13/20 1400  09/14/20 0447 09/15/20 0438 09/16/20 0542 09/17/20 0517  NA 131* 132* 133* 135 134*  K 3.6 2.8* 3.5 3.4* 3.5  CL 90* 95* 98 100 100  CO2 30 26 26 26 26   GLUCOSE 122* 93 102* 104* 99  BUN 49* 46* 39* 25* 17  CREATININE 1.67* 1.36* 1.21 0.91 0.79  CALCIUM 8.6* 7.8* 8.3* 8.6* 8.5*  MG  --   --  2.7* 2.9* 2.7*   GFR: Estimated Creatinine Clearance: 77.1 mL/min (by C-G formula based on SCr of 0.79 mg/dL). Liver Function Tests: Recent Labs  Lab 09/13/20 1240  AST 20  ALT 10  ALKPHOS 108  BILITOT 0.8  PROT 6.1*  ALBUMIN 3.5   No results for input(s): LIPASE, AMYLASE in the last 168 hours. No results for input(s): AMMONIA in the last 168 hours. Coagulation Profile: Recent Labs  Lab 09/13/20 1128  INR 1.4*   Cardiac Enzymes: No results for input(s): CKTOTAL, CKMB, CKMBINDEX, TROPONINI in the last 168 hours. BNP (last 3 results) No results for input(s): PROBNP in the last 8760 hours. HbA1C: No results for input(s): HGBA1C in the last 72 hours. CBG: Recent Labs  Lab 09/16/20 2133  GLUCAP 131*   Lipid Profile: No results for input(s): CHOL, HDL, LDLCALC, TRIG, CHOLHDL, LDLDIRECT in the last 72 hours. Thyroid Function Tests: No results for input(s): TSH, T4TOTAL, FREET4, T3FREE, THYROIDAB in the last 72 hours. Anemia Panel: Recent Labs    09/14/20 2125  VITAMINB12 515   Sepsis Labs: No results for input(s): PROCALCITON, LATICACIDVEN in the last 168 hours.  Recent Results (from the past 240 hour(s))  Respiratory Panel by RT PCR (Flu A&B, Covid) - Nasopharyngeal Swab     Status: None   Collection Time: 09/13/20  5:55 PM   Specimen: Nasopharyngeal Swab  Result Value Ref Range Status   SARS Coronavirus 2 by RT PCR NEGATIVE NEGATIVE Final    Comment: (NOTE) SARS-CoV-2 target nucleic acids are NOT DETECTED.  The SARS-CoV-2 RNA is generally detectable in upper respiratoy specimens during the acute phase of infection. The lowest concentration of SARS-CoV-2 viral  copies this assay can detect is 131 copies/mL. A negative result does not preclude SARS-Cov-2 infection and should not be used as the sole basis for treatment or other patient management decisions. A negative result may occur with  improper specimen collection/handling, submission of specimen other than nasopharyngeal swab, presence of viral mutation(s) within the areas targeted by this assay, and inadequate number of viral copies (<131 copies/mL). A negative result must be combined with clinical observations, patient history, and epidemiological information. The expected result is Negative.  Fact Sheet for Patients:  09/15/20  Fact Sheet for Healthcare Providers:  https://www.moore.com/  This test is no t yet approved or cleared  by the [place] and  has been authorized for detection and/or diagnosis of SARS-CoV-2 by FDA under an Emergency Use Authorization (EUA). This EUA will remain  in effect (meaning this test can be used) for the duration of the COVID-19 declaration under Section 564(b)(1) of the Act, 21 U.S.C. section 360bbb-3(b)(1), unless the authorization is terminated or revoked sooner.     Influenza A by PCR NEGATIVE NEGATIVE Final   Influenza B by PCR NEGATIVE NEGATIVE Final    Comment: (NOTE) The Xpert Xpress SARS-CoV-2/FLU/RSV assay is intended as an aid in  the diagnosis of influenza from Nasopharyngeal swab specimens and  should not be used as a sole basis for treatment. Nasal washings and  aspirates are unacceptable for Xpert Xpress SARS-CoV-2/FLU/RSV  testing.  Fact Sheet for Patients: https://www.moore.com/  Fact Sheet for Healthcare Providers: https://www.young.biz/  This test is not yet approved or cleared by the [place] FDA and  has been authorized for detection and/or diagnosis of SARS-CoV-2 by  FDA under an Emergency Use Authorization (EUA). This  EUA will remain  in effect (meaning this test can be used) for the duration of the  Covid-19 declaration under Section 564(b)(1) of the Act, 21  U.S.C. section 360bbb-3(b)(1), unless the authorization is  terminated or revoked. Performed at [facility], [address], [place], [place] [address]          Radiology Studies: DG Chest [place] 1 View  Result Date: 09/16/2020 CLINICAL DATA:  Shortness of breath EXAM: PORTABLE CHEST 1 VIEW COMPARISON:  09/13/2020 FINDINGS: Cardiac shadow is enlarged but stable. Postsurgical changes are again seen. Vascular congestion and mild interstitial edema is noted. No focal infiltrate is seen. No bony abnormality is noted. IMPRESSION: Mild CHF. Electronically Signed   By: Alcide Clever M.D.   On: 09/16/2020 22:52        Scheduled Meds: . busPIRone  5 mg Oral BID  . docusate sodium  200 mg Oral BID  . DULoxetine  30 mg Oral Daily  . polyethylene glycol  17 g Oral Daily  . rOPINIRole  1 mg Oral Daily  . rOPINIRole  2 mg Oral TID  . traZODone  50 mg Oral QHS   Continuous Infusions:    LOS: 4 days    Time spent: 30 mins     Charise Killian, MD [facility] Pager [phone]  If 7PM-7AM, please contact night-coverage [email or web address] 09/17/2020, 8:01 AM

## 2020-09-17 NOTE — Op Note (Signed)
[facility] Gastroenterology Patient Name: [name] HiltsSydnor [name] Procedure Date: 09/17/2020 8:57 AM MRN: [id number] Account #: [id number] Date of Birth: [birth year] Admit Type: Inpatient Age: 86 Room: [facility] ENDO ROOM 1 Gender: Male Note Status: Finalized Procedure:             Small bowel enteroscopy Indications:           Obscure gastrointestinal bleeding Providers:             Toney Reilohini Reddy Sharnell Knight MD, MD Medicines:             General Anesthesia Complications:         No immediate complications. Estimated blood loss: None. Procedure:             Pre-Anesthesia Assessment:                        - Prior to the procedure, a History and Physical was                         performed, and patient medications and allergies were                         reviewed. The patient is competent. The risks and                         benefits of the procedure and the sedation options and                         risks were discussed with the patient. All questions                         were answered and informed consent was obtained.                         Patient identification and proposed procedure were                         verified by the physician, the nurse, the                         anesthesiologist, the anesthetist and the technician                         in the pre-procedure area in the procedure room in the                         endoscopy suite. Mental Status Examination: alert and                         oriented. Airway Examination: normal oropharyngeal                         airway and neck mobility. Respiratory Examination:                         clear to auscultation. CV Examination: irregularly                         irregular rate and rhythm. Prophylactic Antibiotics:  The patient does not require prophylactic antibiotics.                         Prior Anticoagulants: The patient has taken Coumadin                         (warfarin),  last dose was 4 days prior to procedure.                         ASA Grade Assessment: IV - A patient with severe                         systemic disease that is a constant threat to life.                         After reviewing the risks and benefits, the patient                         was deemed in satisfactory condition to undergo the                         procedure. The anesthesia plan was to use general                         anesthesia. Immediately prior to administration of                         medications, the patient was re-assessed for adequacy                         to receive sedatives. The heart rate, respiratory                         rate, oxygen saturations, blood pressure, adequacy of                         pulmonary ventilation, and response to care were                         monitored throughout the procedure. The physical                         status of the patient was re-assessed after the                         procedure.                        After obtaining informed consent, the endoscope was                         passed under direct vision. Throughout the procedure,                         the patient's blood pressure, pulse, and oxygen                         saturations were monitored continuously. The  Colonoscope was introduced through the mouth and                         advanced to the proximal jejunum. The small bowel                         enteroscopy was accomplished without difficulty. The                         patient tolerated the procedure fairly well. Findings:      There was no evidence of significant pathology in the entire examined       portion of jejunum.      Many non-bleeding superficial duodenal ulcers with a clean ulcer base       ([name] Class III) were found in the duodenal bulb. The largest lesion       was 4 mm in largest dimension.      A single localized erosion with stigmata of recent  bleeding was found in       the second portion of the duodenum. Coagulation for hemostasis using       argon beam was successful.      The esophagus was normal.      The stomach was normal. Impression:            - The examined portion of the jejunum was normal.                        - Non-bleeding duodenal ulcers with a clean ulcer base                         ([name] Class III).                        - Recently bleeding erosive duodenopathy. Treated with                         argon beam coagulation.                        - Normal esophagus.                        - Normal stomach.                        - No specimens collected. Recommendation:        - Return patient to hospital ward for ongoing care.                        - Resume previous diet today.                        - Use Protonix (pantoprazole) 40 mg PO BID for 1 month. Procedure Code(s):     --- Professional ---                        [phone], Small intestinal endoscopy, enteroscopy beyond                         second portion of duodenum, not including ileum; with  control of bleeding (eg, injection, bipolar cautery,                         unipolar cautery, laser, heater probe, stapler, plasma                         coagulator) Diagnosis Code(s):     --- Professional ---                        K26.9, Duodenal ulcer, unspecified as acute or                         chronic, without hemorrhage or perforation                        K92.2, Gastrointestinal hemorrhage, unspecified CPT copyright 2019 American Medical Association. All rights reserved. The codes documented in this report are preliminary and upon coder review may  be revised to meet current compliance requirements. Dr. Libby Maw Toney Reil MD, MD 09/17/2020 9:35:56 AM This report has been signed electronically. Number of Addenda: 0 Note Initiated On: 09/17/2020 8:57 AM Estimated Blood Loss:  Estimated blood loss:  none.      [facility]

## 2020-09-17 NOTE — Anesthesia Postprocedure Evaluation (Signed)
Anesthesia Post Note  Patient: [name]  Procedure(s) Performed: ENTEROSCOPY (N/A )  Patient location during evaluation: PACU Anesthesia Type: General Level of consciousness: awake and alert and oriented Pain management: pain level controlled Vital Signs Assessment: post-procedure vital signs reviewed and stable Respiratory status: spontaneous breathing Cardiovascular status: blood pressure returned to baseline Anesthetic complications: no   No complications documented.   Last Vitals:  Vitals:   09/17/20 1134 09/17/20 1933  BP: 116/69 133/74  Pulse: 86 87  Resp: 20 18  Temp: (!) 36.3 C 36.5 C  SpO2: 92% 93%    Last Pain:  Vitals:   09/17/20 1933  TempSrc: Oral  PainSc:                  Chanee Henrickson

## 2020-09-18 DIAGNOSIS — N179 Acute kidney failure, unspecified: Secondary | ICD-10-CM | POA: Diagnosis not present

## 2020-09-18 DIAGNOSIS — I482 Chronic atrial fibrillation, unspecified: Secondary | ICD-10-CM | POA: Diagnosis not present

## 2020-09-18 DIAGNOSIS — D62 Acute posthemorrhagic anemia: Secondary | ICD-10-CM | POA: Diagnosis not present

## 2020-09-18 LAB — BASIC METABOLIC PANEL
Anion gap: 9 (ref 5–15)
BUN: 16 mg/dL (ref 8–23)
CO2: 26 mmol/L (ref 22–32)
Calcium: 8.4 mg/dL — ABNORMAL LOW (ref 8.9–10.3)
Chloride: 100 mmol/L (ref 98–111)
Creatinine, Ser: 0.81 mg/dL (ref 0.61–1.24)
GFR, Estimated: >60 mL/min (ref >60)
Glucose, Bld: 91 mg/dL (ref 70–99)
Potassium: 3.7 mmol/L (ref 3.5–5.1)
Sodium: 135 mmol/L (ref 135–145)

## 2020-09-18 LAB — CBC
HCT: 28.4 % — ABNORMAL LOW (ref 39.0–52.0)
Hemoglobin: 9.1 g/dL — ABNORMAL LOW (ref 13.0–17.0)
MCH: 29.4 pg (ref 26.0–34.0)
MCHC: 32 g/dL (ref 30.0–36.0)
MCV: 91.9 fL (ref 80.0–100.0)
Platelets: 218 10*3/uL (ref 150–400)
RBC: 3.09 MIL/uL — ABNORMAL LOW (ref 4.22–5.81)
RDW: 16.8 % — ABNORMAL HIGH (ref 11.5–15.5)
WBC: 5.7 10*3/uL (ref 4.0–10.5)
nRBC: 0 % (ref 0.0–0.2)

## 2020-09-18 LAB — MAGNESIUM: Magnesium: 2.4 mg/dL (ref 1.7–2.4)

## 2020-09-18 MED ORDER — WARFARIN - PHARMACIST DOSING INPATIENT: Freq: Every day | Status: DC

## 2020-09-18 MED ORDER — WARFARIN SODIUM 2.5 MG PO TABS
2.5000 mg | ORAL_TABLET | Freq: Once | ORAL | Status: AC
  Administered 2020-09-18: 2.5 mg via ORAL
  Filled 2020-09-18 (×2): qty 1

## 2020-09-18 MED ORDER — ENOXAPARIN SODIUM 40 MG/0.4ML ~~LOC~~ SOLN
40.0000 mg | SUBCUTANEOUS | Status: DC
  Administered 2020-09-18 – 2020-09-19 (×2): 40 mg via SUBCUTANEOUS
  Filled 2020-09-18 (×2): qty 0.4

## 2020-09-18 MED ORDER — ACETAMINOPHEN 650 MG RE SUPP: 650.0000 mg | Freq: Four times a day (QID) | RECTAL | Status: DC | PRN

## 2020-09-18 MED ORDER — ACETAMINOPHEN 325 MG PO TABS
650.0000 mg | ORAL_TABLET | Freq: Four times a day (QID) | ORAL | Status: DC | PRN
  Administered 2020-09-19 – 2020-09-24 (×4): 650 mg via ORAL
  Filled 2020-09-18 (×5): qty 2

## 2020-09-18 NOTE — TOC Initial Note (Signed)
Transition of Care [facility]) - Initial/Assessment Note    Patient Details  Name: [name] MRN: [id number] Date of Birth: [birth year]  [facility] CM/SW Contact:    Maree Krabbe, LCSW Phone Number: 09/18/2020, 3:39 PM  Clinical Narrative:  Pt alert and oriented. Pt hard of hearing. Pt states he lives at [facility] and he wants to go to [facility] for SNF. Per Cala Bradford if there is no bed tomorrow they have a d/c Wednesday. CSW will follow up with her tomorrow.                 Expected Discharge Plan: Skilled Nursing Facility Barriers to Discharge: Continued Medical Work up   Patient Goals and CMS Choice Patient states their goals for this hospitalization and ongoing recovery are:: to get better   Choice offered to / list presented to : Patient  Expected Discharge Plan and Services Expected Discharge Plan: Skilled Nursing Facility In-house Referral: NA   Post Acute Care Choice: Skilled Nursing Facility Living arrangements for the past 2 months: Assisted Living Facility                                      Prior Living Arrangements/Services Living arrangements for the past 2 months: Assisted Living Facility Lives with:: Self Patient language and need for interpreter reviewed:: Yes        Need for Family Participation in Patient Care: Yes (Comment) Care giver support system in place?: Yes (comment)   Criminal Activity/Legal Involvement Pertinent to Current Situation/Hospitalization: No - Comment as needed  Activities of Daily Living Home Assistive Devices/Equipment: None ADL Screening (condition at time of admission) Patient's cognitive ability adequate to safely complete daily activities?: Yes Is the patient deaf or have difficulty hearing?: Yes Does the patient have difficulty seeing, even when wearing glasses/contacts?: Yes Does the patient have difficulty concentrating, remembering, or making decisions?: No Patient able to express  need for assistance with ADLs?: Yes Does the patient have difficulty dressing or bathing?: No Independently performs ADLs?: (P) Yes (appropriate for developmental age) Does the patient have difficulty walking or climbing stairs?: Yes Weakness of Legs: None Weakness of Arms/Hands: None  Permission Sought/Granted Permission sought to share information with : Family Supports    Share Information with NAME: Cala Bradford  Permission granted to share info w AGENCY: [facility]  Permission granted to share info w Relationship: daughter     Emotional Assessment Appearance:: Appears stated age Attitude/Demeanor/Rapport: Engaged Affect (typically observed): Accepting, Appropriate Orientation: : Oriented to Place, Oriented to  Time, Oriented to Situation, Oriented to Self Alcohol / Substance Use: Not Applicable Psych Involvement: No (comment)  Admission diagnosis:  Symptomatic anemia [D64.9] Patient Active Problem List   Diagnosis Date Noted   History of lower GI bleeding 09/13/2020   Symptomatic anemia 09/13/2020   Malnutrition of moderate degree 08/08/2020   Pressure injury of skin 08/02/2020   Hemorrhagic shock (HCC) 07/30/2020   Acute blood loss anemia 07/29/2020   Lactic acidosis 07/29/2020   AKI (acute kidney injury) (HCC) 07/11/2020   Acute on chronic heart failure with preserved ejection fraction (HFpEF) (HCC)    Permanent atrial fibrillation (HCC)    Cellulitis 07/10/2020   Chronic gouty arthropathy 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 04/03/2020   Varicose veins of leg with swelling, right 02/08/2020   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 difficile colitis 05/26/2015   Blood loss anemia    History of mechanical aortic valve replacement    Anemia 04/02/2015   GERD (gastroesophageal reflux disease)    Bradycardia    Depression    Anxiety    Hypertension    Decreased hearing    Coronary artery disease    Aortic stenosis    S/P AVR    Warfarin anticoagulation    Carotid bruit    PCP:  Gracelyn Nurse, MD Pharmacy:   [facility] - [place], [place] - [address] [place] [address] Phone: [phone] Fax: [phone]     Social Determinants of Health (SDOH) Interventions    Readmission Risk Interventions Readmission Risk Prevention Plan 09/18/2020 08/10/2020 08/04/2020  Transportation Screening Complete Complete Complete  PCP or Specialist Appt within 3-5 Days Complete Complete -  HRI or Home Care Consult Complete Complete (No Data)  Social Work Consult for Recovery Care Planning/Counseling Complete - Complete  Palliative Care Screening Complete Not Applicable Not Applicable  Medication Review Oceanographer) Complete Complete Complete  Some recent data might be hidden

## 2020-09-18 NOTE — Progress Notes (Addendum)
PROGRESS NOTE    [name]  GYI:[id number] DOB: 1931/08/12 DOA: 09/13/2020 PCP: Gracelyn Nurse, MD    Assessment & Plan:   Principal Problem:   Acute blood loss anemia Active Problems:   Hypertension   Coronary artery disease   S/P AVR   Warfarin anticoagulation   History of mechanical aortic valve replacement   Chronic diastolic CHF (congestive heart failure) (HCC)   AKI (acute kidney injury) (HCC)   Permanent atrial fibrillation (HCC)   History of lower GI bleeding   Symptomatic anemia   Acute blood loss anemia & possible GI bleed: etiology unclear, but on warfarin for mechanical heart valve. Hx of lower GI bleed w/ hemorrhagic shock. S/p 1 unit of pRBCs transfused 09/13/20. S/p colonoscopy in 07/2020 which showed diverticulosis & blood in the sigmoid colon. Will continue to monitor H&H. S/p capsule study 09/15/20, which shows copious amounts of coffee ground liquid in entire small bowel and likely small bowel erosions but no clear source like AVMs identified. S/p small bowel enteroscopy 09/17/20 which showed non bleeding duodenal ulcers w/ a clean ulcer base & recently bleeding erosive duodenopathy that was treated w/ argon beam coagulation. Will restart warfarin today. Pharmacy to dose and monitor.  Hypotension: resolved  Hypokalemia: within normal limits   CAD: continue to aspirin, warfarin, BB & nitrates given bleeding  Hx of mechanical aortic valve replacement: will restart warfarin today. Pharmacy to dose and monitor   Chronic diastolic CHF: stable. Continue to monitor I/Os  AKI: resolved  Permanent a. fib: will restart warfarin & metoprolol   RLS: continue on home dose of ropinirole   Depression: severity unknown. Continue on home dose of buspar   DVT prophylaxis: warfarin  Code Status: full  Family Communication: discussed pt's care w/ pt's son and answered his questions  Disposition Plan:  Pt is agreeable to SNF today   Status is:  Inpatient  Remains inpatient appropriate because:Ongoing diagnostic testing needed not appropriate for outpatient work up and IV treatments appropriate due to intensity of illness or inability to take PO, will need SNF placement & INR needs to be therapeutic prior to d/c 2.5-3.5   Dispo: The patient is from: Home              Anticipated d/c is to: SNF               Anticipated d/c date is: 2 days - 3 days               Patient currently is not medically stable to d/c.    Consultants:   GI    Procedures:   Antimicrobials:    Subjective: Pt c/o fatigue   Objective: Vitals:   09/17/20 1134 09/17/20 1933 09/18/20 0432 09/18/20 0634  BP: 116/69 133/74 (!) 151/77   Pulse: 86 87 71   Resp: 20 18 20    Temp: (!) 97.3 F (36.3 C) 97.7 F (36.5 C) 97.8 F (36.6 C)   TempSrc: Oral Oral Oral   SpO2: 92% 93% 100%   Weight:    95.9 kg  Height:        Intake/Output Summary (Last 24 hours) at 09/18/2020 0736 Last data filed at 09/17/2020 2033 Gross per 24 hour  Intake 440 ml  Output 475 ml  Net -35 ml   Filed Weights   09/17/20 0346 09/17/20 0854 09/18/20 0634  Weight: 96.6 kg 96.6 kg 95.9 kg    Examination:  General exam: Appears calm & comfortable. Hard  of hearing  Respiratory system: decreased breath sounds b/l.  Cardiovascular system: irregularly irregular. No rubs or gallops  Gastrointestinal system: Abdomen is obese, soft and nontender. Normal bowel sounds heard. Central nervous system: alert and oriented. Moves all 4 extremities  Psychiatry: Judgement and insight appear normal. Normal mood and affect     Data Reviewed: I have personally reviewed following labs and imaging studies  CBC: Recent Labs  Lab 09/15/20 0438 09/16/20 0542 09/16/20 2219 09/17/20 0517 09/18/20 0653  WBC 5.4 7.4 7.2 7.2 5.7  HGB 9.3* 9.4* 9.1* 9.4* 9.1*  HCT 27.5* 28.4* 28.4* 28.8* 28.4*  MCV 88.4 89.9 91.3 91.1 91.9  PLT 206 222 246 227 218   Basic Metabolic Panel: Recent  Labs  Lab 09/14/20 0447 09/15/20 0438 09/16/20 0542 09/17/20 0517 09/18/20 0607  NA 132* 133* 135 134* 135  K 2.8* 3.5 3.4* 3.5 3.7  CL 95* 98 100 100 100  CO2 26 26 26 26 26   GLUCOSE 93 102* 104* 99 91  BUN 46* 39* 25* 17 16  CREATININE 1.36* 1.21 0.91 0.79 0.81  CALCIUM 7.8* 8.3* 8.6* 8.5* 8.4*  MG  --  2.7* 2.9* 2.7* 2.4   GFR: Estimated Creatinine Clearance: 75.9 mL/min (by C-G formula based on SCr of 0.81 mg/dL). Liver Function Tests: Recent Labs  Lab 09/13/20 1240  AST 20  ALT 10  ALKPHOS 108  BILITOT 0.8  PROT 6.1*  ALBUMIN 3.5   No results for input(s): LIPASE, AMYLASE in the last 168 hours. No results for input(s): AMMONIA in the last 168 hours. Coagulation Profile: Recent Labs  Lab 09/13/20 1128  INR 1.4*   Cardiac Enzymes: No results for input(s): CKTOTAL, CKMB, CKMBINDEX, TROPONINI in the last 168 hours. BNP (last 3 results) No results for input(s): PROBNP in the last 8760 hours. HbA1C: No results for input(s): HGBA1C in the last 72 hours. CBG: Recent Labs  Lab 09/16/20 2133  GLUCAP 131*   Lipid Profile: No results for input(s): CHOL, HDL, LDLCALC, TRIG, CHOLHDL, LDLDIRECT in the last 72 hours. Thyroid Function Tests: No results for input(s): TSH, T4TOTAL, FREET4, T3FREE, THYROIDAB in the last 72 hours. Anemia Panel: No results for input(s): VITAMINB12, FOLATE, FERRITIN, TIBC, IRON, RETICCTPCT in the last 72 hours. Sepsis Labs: No results for input(s): PROCALCITON, LATICACIDVEN in the last 168 hours.  Recent Results (from the past 240 hour(s))  Respiratory Panel by RT PCR (Flu A&B, Covid) - Nasopharyngeal Swab     Status: None   Collection Time: 09/13/20  5:55 PM   Specimen: Nasopharyngeal Swab  Result Value Ref Range Status   SARS Coronavirus 2 by RT PCR NEGATIVE NEGATIVE Final    Comment: (NOTE) SARS-CoV-2 target nucleic acids are NOT DETECTED.  The SARS-CoV-2 RNA is generally detectable in upper respiratoy specimens during the acute  phase of infection. The lowest concentration of SARS-CoV-2 viral copies this assay can detect is 131 copies/mL. A negative result does not preclude SARS-Cov-2 infection and should not be used as the sole basis for treatment or other patient management decisions. A negative result may occur with  improper specimen collection/handling, submission of specimen other than nasopharyngeal swab, presence of viral mutation(s) within the areas targeted by this assay, and inadequate number of viral copies (<131 copies/mL). A negative result must be combined with clinical observations, patient history, and epidemiological information. The expected result is Negative.  Fact Sheet for Patients:  09/15/20  Fact Sheet for Healthcare Providers:  https://www.moore.com/  This test is no t yet  approved or cleared by the [place] and  has been authorized for detection and/or diagnosis of SARS-CoV-2 by FDA under an Emergency Use Authorization (EUA). This EUA will remain  in effect (meaning this test can be used) for the duration of the COVID-19 declaration under Section 564(b)(1) of the Act, 21 U.S.C. section 360bbb-3(b)(1), unless the authorization is terminated or revoked sooner.     Influenza A by PCR NEGATIVE NEGATIVE Final   Influenza B by PCR NEGATIVE NEGATIVE Final    Comment: (NOTE) The Xpert Xpress SARS-CoV-2/FLU/RSV assay is intended as an aid in  the diagnosis of influenza from Nasopharyngeal swab specimens and  should not be used as a sole basis for treatment. Nasal washings and  aspirates are unacceptable for Xpert Xpress SARS-CoV-2/FLU/RSV  testing.  Fact Sheet for Patients: https://www.moore.com/  Fact Sheet for Healthcare Providers: https://www.young.biz/  This test is not yet approved or cleared by the [place] FDA and  has been authorized for detection and/or diagnosis of  SARS-CoV-2 by  FDA under an Emergency Use Authorization (EUA). This EUA will remain  in effect (meaning this test can be used) for the duration of the  Covid-19 declaration under Section 564(b)(1) of the Act, 21  U.S.C. section 360bbb-3(b)(1), unless the authorization is  terminated or revoked. Performed at [facility], [address], [place], [place] [address]          Radiology Studies: DG Chest [place] 1 View  Result Date: 09/16/2020 CLINICAL DATA:  Shortness of breath EXAM: PORTABLE CHEST 1 VIEW COMPARISON:  09/13/2020 FINDINGS: Cardiac shadow is enlarged but stable. Postsurgical changes are again seen. Vascular congestion and mild interstitial edema is noted. No focal infiltrate is seen. No bony abnormality is noted. IMPRESSION: Mild CHF. Electronically Signed   By: Alcide Clever M.D.   On: 09/16/2020 22:52        Scheduled Meds: . busPIRone  5 mg Oral BID  . docusate sodium  200 mg Oral BID  . DULoxetine  30 mg Oral Daily  . metoprolol succinate  12.5 mg Oral Daily  . pantoprazole  40 mg Oral BID AC  . polyethylene glycol  17 g Oral Daily  . rOPINIRole  1 mg Oral Daily  . rOPINIRole  2 mg Oral TID  . traZODone  50 mg Oral QHS   Continuous Infusions:    LOS: 5 days    Time spent: 34 mins     Charise Killian, MD [facility] Pager [phone]  If 7PM-7AM, please contact night-coverage [email or web address] 09/18/2020, 7:36 AM

## 2020-09-18 NOTE — Progress Notes (Signed)
Physical Therapy Treatment Patient Details Name: [name] MRN: [id number] DOB: Apr 16, 1931 Today's Date: 09/18/2020    History of Present Illness Patient is an 86 year old male who presented to ED for weakness and low hemoglobin; admitted for management of GIB with symptomatic anemia. PMh includes aortic stenosis s/p mechanical AVR on coumadin, chronic diastolic HF, HTN, CAD, chronic venous stasis, chronic anemia, GERD, C diff, GI hemorrhage (sept 2021), anxiety, bradycardia, hearin glost, RLS    PT Comments    Pt presents supine in bed on arrival to room and agreeable to PT session. He is CGA+1 with bed mobility as he takes increased time and heavily relies on bed rails throughout. He is minA+1 for sit<>stand transfer and requires VCs to use UEs to push up from bed. During ambulation, pt walks 130 feet with CGA with RW, demonstrating decreased step height/length with heavy use of BUEs on RW. Pt is slow and deliberate, but with no overt buckling or LOB. Pt demonstrates increased SOB once back at his room and clears up within seconds after sitting on bed. Pt performed 5TSTS in 31.63 secs, indicating decreased power, strength, endurance, and increase risk for falls. Pt performed exercises sitting EOB as well as supine in bed. He is encouraged to continue throughout his day. He is left in bed with all needs and daughter bedside. Pt continues to benefit from skilled PT services to maximize return to PLOF and minimize risk of future falls, injury, caregiver burden, and readmission. Will continue to follow POC. Discharge recommendation remains appropriate.   Follow Up Recommendations  SNF     Equipment Recommendations  Rolling walker with 5" wheels    Recommendations for Other Services       Precautions / Restrictions Precautions Precautions: Fall Restrictions Weight Bearing Restrictions: No    Mobility  Bed Mobility Overal bed mobility: Modified Independent Bed Mobility:  Supine to Sit;Sit to Supine     Supine to sit: Modified independent (Device/Increase time);HOB elevated Sit to supine: Modified independent (Device/Increase time)   General bed mobility comments: Pt takes increased time for supine to sit and uses bedrails throughout, but no outside assistance required  Transfers Overall transfer level: Needs assistance Equipment used: Rolling walker (2 wheeled) Transfers: Sit to/from Stand Sit to Stand: Min assist;From elevated surface         General transfer comment: Patient able to push off from bed and perform sit<>stand transfer with minA  Ambulation/Gait Ambulation/Gait assistance: Min guard Gait Distance (Feet): 130 Feet Assistive device: Rolling walker (2 wheeled) Gait Pattern/deviations: Step-to pattern;Decreased step length - left;Decreased step length - right;Shuffle     General Gait Details: Decreased step height/length with heavy WBing bilat UEs on RW; Pt is slow and deliberate, but no overt buckling or LOB. Pt demonstrates increased SOB once back at his room   Stairs             Wheelchair Mobility    Modified Rankin (Stroke Patients Only)       Balance Overall balance assessment: Needs assistance Sitting-balance support: Feet supported;No upper extremity supported Sitting balance-Leahy Scale: Good Sitting balance - Comments: Pt able to maintain balance on sitting EOB   Standing balance support: Bilateral upper extremity supported Standing balance-Leahy Scale: Fair Standing balance comment: Pt able to maintain balance with BUE supported on RW                            Cognition Arousal/Alertness: Awake/alert  Behavior During Therapy: WFL for tasks assessed/performed Overall Cognitive Status: Within Functional Limits for tasks assessed                                        Exercises General Exercises - Lower Extremity Ankle Circles/Pumps: AROM;Both;10 reps;Seated Quad Sets:  AROM;Both;10 reps;Supine Hip ABduction/ADduction: AROM;Both;10 reps;Supine Hip Flexion/Marching: AROM;Both;10 reps;Seated Other Exercises Other Exercises: 5TSTS performed in 31.63 secs, indicating decreased power, strength, endurance, increase risk for falls    General Comments        Pertinent Vitals/Pain Pain Assessment: Faces Faces Pain Scale: Hurts little more Pain Location: Bilateral knees during ambulating Pain Descriptors / Indicators: Aching;Sore Pain Intervention(s): Monitored during session    Home Living                      Prior Function            PT Goals (current goals can now be found in the care plan section) Acute Rehab PT Goals Patient Stated Goal: To return home  PT Goal Formulation: With patient Time For Goal Achievement: 09/30/20 Potential to Achieve Goals: Good Progress towards PT goals: Progressing toward goals    Frequency    Min 2X/week      PT Plan Current plan remains appropriate    Co-evaluation              AM-PAC PT "6 Clicks" Mobility   Outcome Measure  Help needed turning from your back to your side while in a flat bed without using bedrails?: None Help needed moving from lying on your back to sitting on the side of a flat bed without using bedrails?: A Little Help needed moving to and from a bed to a chair (including a wheelchair)?: A Little Help needed standing up from a chair using your arms (e.g., wheelchair or bedside chair)?: A Little Help needed to walk in hospital room?: A Little Help needed climbing 3-5 steps with a railing? : A Lot 6 Click Score: 18    End of Session Equipment Utilized During Treatment: Gait belt Activity Tolerance: Patient tolerated treatment well Patient left: in bed;with call bell/phone within reach;with bed alarm set;with family/visitor present Nurse Communication: Mobility status PT Visit Diagnosis: Muscle weakness (generalized) (M62.81);Difficulty in walking, not elsewhere  classified (R26.2)     Time: 0865-7846 PT Time Calculation (min) (ACUTE ONLY): 31 min  Charges:                         Katherine Basset, SPT Baker Pierini 09/18/2020, 2:45 PM

## 2020-09-18 NOTE — Care Management Important Message (Signed)
Important Message  Patient Details  Name: [name] MRN: [id number] Date of Birth: 1931-02-04   Medicare Important Message Given:  Yes     [name] 09/18/2020, 12:10 PM

## 2020-09-18 NOTE — Consult Note (Signed)
ANTICOAGULATION CONSULT NOTE  Pharmacy Consult for Warfarin Indication: mAVR w/ Hx of Afib  Allergies  Allergen Reactions  . Sulfa Antibiotics Other (See Comments)    Reaction:  Unknown   . Levofloxacin Nausea Only    Patient Measurements: Height: 5\' 11"  (180.3 cm) Weight: 95.9 kg (211 lb 8 oz) IBW/kg (Calculated) : 75.3  Vital Signs: Temp: 97.5 F (36.4 C) (10/25 0806) Temp Source: Oral (10/25 0806) BP: 131/82 (10/25 0806) Pulse Rate: 68 (10/25 0806)  Labs: Recent Labs    09/16/20 0542 09/16/20 0542 09/16/20 2219 09/16/20 2219 09/17/20 0018 09/17/20 0517 09/18/20 0607 09/18/20 0653  HGB 9.4*   < > 9.1*   < >  --  9.4*  --  9.1*  HCT 28.4*   < > 28.4*  --   --  28.8*  --  28.4*  PLT 222   < > 246  --   --  227  --  218  CREATININE 0.91  --   --   --   --  0.79 0.81  --   TROPONINIHS  --   --  11  --  13  --   --   --    < > = values in this interval not displayed.    Estimated Creatinine Clearance: 75.9 mL/min (by C-G formula based on SCr of 0.81 mg/dL).   Medical History: Past Medical History:  Diagnosis Date  . Anxiety 10/11  . Aortic stenosis    a. s/p mechcanical AVR, 2002; b. 10/2018 Echo: Triv AI, mean grad 11/2018.  . Bradycardia    chronic, no symptoms 07/2010  . C. difficile colitis   . Carotid bruit    dopplers in past, no abnormalities  . Chronic diastolic CHF (congestive heart failure) (HCC)    a. Echo 03/2015: EF 60-65%, no RWMA, GR1DD, mild BAE, mild to mod MR, mod TR, PASP 65 mmHg; b. 01/2017 Echo: EF 55-60%, NRWMA, grade 1 diastolic dysfunction.  Normal functioning prosthetic aortic valve.  Mean gradient 50 mmHg.  Sev TR. PASP 02/2017; c. 10/2018 Echo: EF 55-60%, Triv AI, mod dil LA, mod-sev TR, PASP 35-40, mild to mod red RV fxn.  . Coronary artery disease    a. mild, cath, 08/2010; b. medically managed  . Decreased hearing    Right ear  . Depression   . Gastric ulcer   . GERD (gastroesophageal reflux disease)   . Hypertension    BP higher  than usual 04/19/10; amlodipine increased by telephone  . Mod-Sev Tricuspid regurgitation    a. 10/2018 Echo: Mod-Sev TR, PASP 35-63mmHg.  43m RLS (restless legs syndrome) 08/23/2015  . S/P AVR    a. [employer]. mechanical 2002; b. echo 08/2010 EF 60%, trival AI, mild MR, AVR working well; c. on longterm warfarin tx  . SOB (shortness of breath) 10/11   08/2010,Episodes at 5 AM, eventually felt to be anxiety, after complete workup including catheterization, pt greatly improved with anxiety meds 11/11    Medications:  Medications Prior to Admission  Medication Sig Dispense Refill Last Dose  . allopurinol (ZYLOPRIM) 300 MG tablet Take 300 mg by mouth daily.    09/13/2020 at 0900  . Cetirizine HCl 10 MG CAPS Take 10 mg by mouth daily.   09/13/2020 at 0900  . finasteride (PROSCAR) 5 MG tablet Take 5 mg by mouth daily.     09/13/2020 at 0900  . metoprolol succinate (TOPROL-XL) 25 MG 24 hr tablet Take 0.5 tablets (12.5 mg total) by  mouth daily. 90 tablet 3 09/13/2020 at 0900  . pantoprazole (PROTONIX) 40 MG tablet Take 40 mg by mouth daily.   09/13/2020 at 0900  . potassium chloride (KLOR-CON) 10 MEQ tablet TAKE 3 TABLETS (30 MEQ) BY MOUTH DAILY. TAKE AN ADDITIONAL 2TABS ON THE DAYS YOU TAKE METOLAZONE 294 tablet 0 09/13/2020 at 0900  . rOPINIRole (REQUIP) 2 MG tablet TAKE 1 TABLET BY MOUTH 4  TIMES DAILY (Patient taking differently: Take 2 mg by mouth in the morning, at noon, in the evening, and at bedtime. Take 1 tablet by mouth 4  times daily) 360 tablet 3 09/13/2020 at 0900  . torsemide (DEMADEX) 20 MG tablet Take 2 tablets (40 mg total) by mouth daily. Take additional 40 mg for 3 pounds weight gain . (Patient taking differently: Take 40 mg by mouth 2 (two) times daily. ) 60 tablet 2 09/13/2020 at 0900  . traZODone (DESYREL) 50 MG tablet Take 50 mg by mouth at bedtime.   09/12/2020 at 1600  . warfarin (COUMADIN) 2.5 MG tablet Take 1 tablet (2.5 mg total) by mouth daily at 4 PM. Takes daily at 1700  (Patient taking differently: Take 2.5-3.75 mg by mouth daily. Monday, Tuesday, thursday,friday,saturday, sunday 3.75mg -Wednesday)   09/12/2020 at 1600  . Acetaminophen 500 MG capsule Take 2 capsules by mouth every 8 (eight) hours as needed.   PRN at PRN  . busPIRone (BUSPAR) 5 MG tablet Take 1 tablet (5 mg total) by mouth 2 (two) times daily. (Patient not taking: Reported on 09/13/2020)   Not Taking at Unknown time  . DULoxetine (CYMBALTA) 30 MG capsule Take 1 capsule (30 mg total) by mouth daily. (Patient not taking: Reported on 09/13/2020) 30 capsule 3 Not Taking at Unknown time  . metolazone (ZAROXOLYN) 2.5 MG tablet Take 1 tablet (2.5 mg total) by mouth 2 (two) times a week. 72 tablet 3 09/11/2020   Scheduled:  . busPIRone  5 mg Oral BID  . docusate sodium  200 mg Oral BID  . DULoxetine  30 mg Oral Daily  . metoprolol succinate  12.5 mg Oral Daily  . pantoprazole  40 mg Oral BID AC  . polyethylene glycol  17 g Oral Daily  . rOPINIRole  1 mg Oral Daily  . rOPINIRole  2 mg Oral TID  . traZODone  50 mg Oral QHS   Infusions:   PRN: acetaminophen **OR** acetaminophen, bisacodyl, morphine injection, ondansetron **OR** ondansetron (ZOFRAN) IV Anti-infectives (From admission, onward)   None      Assessment: Pharmacy consulted to restart patient's warfarin. Anticoagulation was on hold due to GI bleed. Cleared by GI per attending. No major DDI. Hx of bleeding with a Hgb of 4.1 back in September.   Home regimen: warfarin 2.5 mg [id number] and 3.75 mg on Wednesday  Date INR Warfarin Dose  10/20 1.4 baseline  10/25 -- 2.5 mg     Goal of Therapy:  INR 2.5 - 3.5  Monitor platelets by anticoagulation protocol: Yes   Plan:  Will give warfarin 2.5 mg x 1 tonight and will start an enoxaparin 40 mg daily for DVT ppx while INR gets therapeutic. Daily INR ordered. CBC at least every 3 days. Due to the bleeding risk/history, we will take more cautious approach.   Ronnald Ramp, PharmD,  BCPS 09/18/2020,1:15 PM

## 2020-09-18 NOTE — Plan of Care (Signed)

## 2020-09-18 NOTE — [place] FL2 (Signed)
[place] MEDICAID FL2 LEVEL OF CARE SCREENING TOOL     IDENTIFICATION  Patient Name: [name] Birthdate: 02/05/1931 Sex: male Admission Date (Current Location): 09/13/2020  [place] and [place] Number:  Chiropodist and Address:  [facility], [address] [place], [place] [address]      Provider Number: [id number]  Attending Physician Name and Address:  Charise Killian, MD  Relative Name and Phone Number:       Current Level of Care: Hospital Recommended Level of Care: Skilled Nursing Facility Prior Approval Number:    Date Approved/Denied:   PASRR Number:    Discharge Plan: SNF    Current Diagnoses: Patient Active Problem List   Diagnosis Date Noted  . History of lower GI bleeding 09/13/2020  . Symptomatic anemia 09/13/2020  . Malnutrition of moderate degree 08/08/2020  . Pressure injury of skin 08/02/2020  . Hemorrhagic shock (HCC) 07/30/2020  . Acute blood loss anemia 07/29/2020  . Lactic acidosis 07/29/2020  . AKI (acute kidney injury) (HCC) 07/11/2020  . Acute on chronic heart failure with preserved ejection fraction (HFpEF) (HCC)   . Permanent atrial fibrillation (HCC)   . Cellulitis 07/10/2020  . 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 Varicose veins of leg with swelling, right 02/08/2020  . 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 Greater trochanteric pain syndrome 11/02/2019  . Lower limb ulcer, ankle, left, limited to breakdown of skin (HCC) 11/02/2019  . H/O atrial flutter 07/15/2018  . Obstructive sleep apnea 10/23/2017  . Chronic diastolic CHF (congestive heart failure) (HCC) 09/24/2017  . Lymphedema 09/24/2017  . Chronic hyponatremia 08/12/2017  . Encounter for anticoagulation discussion and counseling 02/24/2017  . Bilateral leg edema 09/25/2015  . RLS (restless legs syndrome) 08/23/2015  . C. difficile colitis 05/26/2015  . Blood loss  anemia   . History of mechanical aortic valve replacement   . Anemia 04/02/2015  . GERD (gastroesophageal reflux disease)   . Bradycardia   . Depression   . Anxiety   . Hypertension   . Decreased hearing   . Coronary artery disease   . Aortic stenosis   . S/P AVR   . Warfarin anticoagulation   . Carotid bruit     Orientation RESPIRATION BLADDER Height & Weight     Self, Time, Situation, Place  Normal Continent Weight: 211 lb 8 oz (95.9 kg) Height:  5\' 11"  (180.3 cm)  BEHAVIORAL SYMPTOMS/MOOD NEUROLOGICAL BOWEL NUTRITION STATUS      Continent Diet (heart healthy, thin liquids)  AMBULATORY STATUS COMMUNICATION OF NEEDS Skin   Limited Assist Verbally Normal                       Personal Care Assistance Level of Assistance  Bathing, Feeding, Dressing Bathing Assistance: Limited assistance Feeding assistance: Independent Dressing Assistance: Limited assistance     Functional Limitations Info  Sight, Hearing, Speech Sight Info: Adequate Hearing Info: Adequate Speech Info: Adequate    SPECIAL CARE FACTORS FREQUENCY  PT (By licensed PT), OT (By licensed OT)     PT Frequency: 5x OT Frequency: 5x            Contractures Contractures Info: Not present    Additional Factors Info  Code Status, Allergies Code Status Info: full code Allergies Info: Sulfa Antibiotics, Levofloxacin           Current Medications (09/18/2020):  This is the current hospital  active medication list Current Facility-Administered Medications  Medication Dose Route Frequency Provider Last Rate Last Admin  . acetaminophen (TYLENOL) tablet 650 mg  650 mg Oral Q6H PRN Andris Baumann, MD   650 mg at 09/15/20 1113   Or  . acetaminophen (TYLENOL) suppository 650 mg  650 mg Rectal Q6H PRN Andris Baumann, MD      . bisacodyl (DULCOLAX) suppository 10 mg  10 mg Rectal Daily PRN Manuela Schwartz, NP      . busPIRone (BUSPAR) tablet 5 mg  5 mg Oral BID Charise Killian, MD   5 mg at  09/18/20 4627  . docusate sodium (COLACE) capsule 200 mg  200 mg Oral BID Charise Killian, MD   200 mg at 09/18/20 0901  . DULoxetine (CYMBALTA) DR capsule 30 mg  30 mg Oral Daily Charise Killian, MD   30 mg at 09/18/20 0902  . metoprolol succinate (TOPROL-XL) 24 hr tablet 12.5 mg  12.5 mg Oral Daily Charise Killian, MD   12.5 mg at 09/18/20 0902  . morphine 2 MG/ML injection 2 mg  2 mg Intravenous Q2H PRN Andris Baumann, MD      . ondansetron [facility]) tablet 4 mg  4 mg Oral Q6H PRN Andris Baumann, MD       Or  . ondansetron [facility]) injection 4 mg  4 mg Intravenous Q6H PRN Andris Baumann, MD      . pantoprazole (PROTONIX) EC tablet 40 mg  40 mg Oral BID AC Toney Reil, MD   40 mg at 09/18/20 0902  . polyethylene glycol (MIRALAX / GLYCOLAX) packet 17 g  17 g Oral Daily Manuela Schwartz, NP   17 g at 09/18/20 0901  . rOPINIRole (REQUIP) tablet 1 mg  1 mg Oral Daily Charise Killian, MD   1 mg at 09/18/20 0430  . rOPINIRole (REQUIP) tablet 2 mg  2 mg Oral TID Charise Killian, MD   2 mg at 09/18/20 0901  . traZODone (DESYREL) tablet 50 mg  50 mg Oral QHS Andris Baumann, MD   50 mg at 09/17/20 2105     Discharge Medications: Please see discharge summary for a list of discharge medications.  Relevant Imaging Results:  Relevant Lab Results:   Additional Information SSN:[id number]  Reuel Boom Markese [place]oxham, LCSW

## 2020-09-19 DIAGNOSIS — D62 Acute posthemorrhagic anemia: Secondary | ICD-10-CM | POA: Diagnosis not present

## 2020-09-19 DIAGNOSIS — N179 Acute kidney failure, unspecified: Secondary | ICD-10-CM | POA: Diagnosis not present

## 2020-09-19 DIAGNOSIS — I482 Chronic atrial fibrillation, unspecified: Secondary | ICD-10-CM | POA: Diagnosis not present

## 2020-09-19 LAB — H PYLORI, IGM, IGG, IGA AB
H Pylori IgG: 0.68 Index Value (ref 0.00–0.79)
H. Pylogi, Iga Abs: <9 units (ref 0.0–8.9)
H. Pylogi, Igm Abs: <9 units (ref 0.0–8.9)

## 2020-09-19 LAB — BASIC METABOLIC PANEL
Anion gap: 9 (ref 5–15)
BUN: 18 mg/dL (ref 8–23)
CO2: 28 mmol/L (ref 22–32)
Calcium: 8.8 mg/dL — ABNORMAL LOW (ref 8.9–10.3)
Chloride: 99 mmol/L (ref 98–111)
Creatinine, Ser: 0.88 mg/dL (ref 0.61–1.24)
GFR, Estimated: >60 mL/min (ref >60)
Glucose, Bld: 88 mg/dL (ref 70–99)
Potassium: 3.8 mmol/L (ref 3.5–5.1)
Sodium: 136 mmol/L (ref 135–145)

## 2020-09-19 LAB — MAGNESIUM: Magnesium: 2.5 mg/dL — ABNORMAL HIGH (ref 1.7–2.4)

## 2020-09-19 LAB — CBC
HCT: 28.9 % — ABNORMAL LOW (ref 39.0–52.0)
Hemoglobin: 9 g/dL — ABNORMAL LOW (ref 13.0–17.0)
MCH: 28.7 pg (ref 26.0–34.0)
MCHC: 31.1 g/dL (ref 30.0–36.0)
MCV: 92 fL (ref 80.0–100.0)
Platelets: 209 10*3/uL (ref 150–400)
RBC: 3.14 MIL/uL — ABNORMAL LOW (ref 4.22–5.81)
RDW: 17.1 % — ABNORMAL HIGH (ref 11.5–15.5)
WBC: 4.5 10*3/uL (ref 4.0–10.5)
nRBC: 0 % (ref 0.0–0.2)

## 2020-09-19 LAB — PROTIME-INR
INR: 1.2 (ref 0.8–1.2)
Prothrombin Time: 14.7 seconds (ref 11.4–15.2)

## 2020-09-19 MED ORDER — FUROSEMIDE 40 MG PO TABS
40.0000 mg | ORAL_TABLET | Freq: Every day | ORAL | Status: DC
  Administered 2020-09-19 – 2020-09-21 (×3): 40 mg via ORAL
  Filled 2020-09-19 (×3): qty 1

## 2020-09-19 MED ORDER — WARFARIN SODIUM 4 MG PO TABS
4.0000 mg | ORAL_TABLET | Freq: Once | ORAL | Status: AC
  Administered 2020-09-19: 4 mg via ORAL
  Filled 2020-09-19: qty 1

## 2020-09-19 NOTE — Progress Notes (Signed)
Mobility Specialist - Progress Note   09/19/20 1330  Mobility  Activity Ambulated in hall  Level of Assistance Contact guard assist, steadying assist  Assistive Device Front wheel walker  Distance Ambulated (ft) 100 ft  Mobility Response Tolerated well  Mobility performed by Mobility specialist  $Mobility charge 1 Mobility    Pre-mobility: 76 HR, 97% SpO2   Pt was lying in bed upon arrival utilizing room air. Pt agreed to session. Pt denied any pain, nausea, or fatigue. Pt was SBA getting EOB and modA standing to RW from elevated bed height. Pt was able to ambulate ~100' in room/hallway with close supervision and CGA. Noted pt is WBAT on L LE. Pt stated that he "can only apply some pressure on L leg d/t bones rubbing together from osteoarthritis". Pt presents with slow, steady gait. Noted heavy breathing, but could not get oxygen towards end of session d/t poor pleth. Overall, pt tolerated session well. Pt requested to return to recliner at the end of session with all needs in reach.   Filiberto Pinks Mobility Specialist 09/19/20, 1:37 PM

## 2020-09-19 NOTE — Progress Notes (Signed)
Occupational Therapy Treatment Patient Details Name: [name] MRN: [id number] DOB: 04-07-1931 Today's Date: 09/19/2020    History of present illness Patient is an 86 year old male who presented to ED for weakness and low hemoglobin; admitted for management of GIB with symptomatic anemia. PMh includes aortic stenosis s/p mechanical AVR on coumadin, chronic diastolic HF, HTN, CAD, chronic venous stasis, chronic anemia, GERD, C diff, GI hemorrhage (sept 2021), anxiety, bradycardia, hearin glost, RLS   OT comments  Pt seen for OT tx this date. Pt performed seated sponge bath with set sup and supervision primarily, Min A to wash back and Min A to complete doffing of undergarments over feet and initiate donning of clean pair. Set up for grooming seated EOB. Pt required brief rest breaks occasionally during bathing but did not require cues to utilize. Pt denied SOB, pain. Endorsed very mild lightheadedness upon initial sitting EOB but resolved quickly and did not recur during session. HR in 70's and SpO2 >97% on room air throughout session. Pt did endorse fatigue at end of session. Overall, pt tolerated session well. Continues to benefit from skilled OT services to maximize return to PLOF and minimize falls risk and caregiver burden. Continue to recommend SNF and will assess as pt continues to progress.   Follow Up Recommendations  SNF    Equipment Recommendations  None recommended by OT    Recommendations for Other Services      Precautions / Restrictions Precautions Precautions: Fall Restrictions Weight Bearing Restrictions: No       Mobility Bed Mobility Overal bed mobility: Modified Independent Bed Mobility: Supine to Sit;Sit to Supine     Supine to sit: HOB elevated;Min guard Sit to supine: Min guard   General bed mobility comments: increased time/effort, heavy reliance on bed rails  Transfers Overall transfer level: Needs assistance Equipment used: Rolling walker  (2 wheeled) Transfers: Sit to/from Stand Sit to Stand: From elevated surface;Min guard;Min assist              Balance Overall balance assessment: Needs assistance Sitting-balance support: Feet supported;No upper extremity supported Sitting balance-Leahy Scale: Good     Standing balance support: Single extremity supported;During functional activity Standing balance-Leahy Scale: Fair                             ADL either performed or assessed with clinical judgement   ADL Overall ADL's : Needs assistance/impaired                                       General ADL Comments: Set up for seated grooming tasks, Set up and Min A for seated sponge bath, Min A for LB dressing to doff/don undergarments over feet, CGA For STS transfer from EOB with bed raised and RW for UE support; Pt tolerated well, HR in 70's, O2 on room air >97% throughout     Vision Patient Visual Report: No change from baseline     Perception     Praxis      Cognition Arousal/Alertness: Awake/alert Behavior During Therapy: WFL for tasks assessed/performed Overall Cognitive Status: Within Functional Limits for tasks assessed  Exercises     Shoulder Instructions       General Comments      Pertinent Vitals/ Pain       Pain Assessment: No/denies pain  Home Living                                          Prior Functioning/Environment              Frequency  Min 1X/week        Progress Toward Goals  OT Goals(current goals can now be found in the care plan section)  Progress towards OT goals: Progressing toward goals  Acute Rehab OT Goals Patient Stated Goal: To return home  OT Goal Formulation: With patient Time For Goal Achievement: 09/29/20 Potential to Achieve Goals: Good  Plan Discharge plan remains appropriate;Frequency remains appropriate    Co-evaluation                  AM-PAC OT "6 Clicks" Daily Activity     Outcome Measure   Help from another person eating meals?: None Help from another person taking care of personal grooming?: A Little Help from another person toileting, which includes using toliet, bedpan, or urinal?: A Little Help from another person bathing (including washing, rinsing, drying)?: A Little Help from another person to put on and taking off regular upper body clothing?: None Help from another person to put on and taking off regular lower body clothing?: A Little 6 Click Score: 20    End of Session Equipment Utilized During Treatment: Gait belt;Rolling walker  OT Visit Diagnosis: Other abnormalities of gait and mobility (R26.89);Muscle weakness (generalized) (M62.81)   Activity Tolerance Patient tolerated treatment well   Patient Left in bed;with call bell/phone within reach;with bed alarm set;with nursing/sitter in room (CNA for vitals and reapplying telemetry)   Nurse Communication          Time: 3875-6433 OT Time Calculation (min): 40 min  Charges: OT General Charges $OT Visit: 1 Visit OT Treatments $Self Care/Home Management : 38-52 mins  Richrd Prime, MPH, MS, OTR/L ascom [phone] 09/19/20, 1:33 PM

## 2020-09-19 NOTE — Progress Notes (Signed)
At 0115, pt found with CPAP off, no O2 on.  O2 sat checked and was 70%.  O2 placed [place] at 5 liters and CPAP replaced.  O2 sat now 100%.  Pt instructed to call for assist if he feels the need to take his mask or O2 off.   Pt verbalized understand.

## 2020-09-19 NOTE — Consult Note (Signed)
ANTICOAGULATION CONSULT NOTE  Pharmacy Consult for Warfarin Indication: mAVR w/ Hx of Afib  Allergies  Allergen Reactions  . Sulfa Antibiotics Other (See Comments)    Reaction:  Unknown   . Levofloxacin Nausea Only    Patient Measurements: Height: 5\' 11"  (180.3 cm) Weight: 97.7 kg (215 lb 6.4 oz) IBW/kg (Calculated) : 75.3  Vital Signs: Temp: 97.7 F (36.5 C) (10/26 1106) Temp Source: Oral (10/26 1106) BP: 132/63 (10/26 1106) Pulse Rate: 60 (10/26 1106)  Labs: Recent Labs    09/16/20 2219 09/16/20 2219 09/17/20 0018 09/17/20 0517 09/17/20 0517 09/18/20 0607 09/18/20 0653 09/19/20 0532  HGB 9.1*   < >  --  9.4*   < >  --  9.1* 9.0*  HCT 28.4*   < >  --  28.8*  --   --  28.4* 28.9*  PLT 246   < >  --  227  --   --  218 209  LABPROT  --   --   --   --   --   --   --  14.7  INR  --   --   --   --   --   --   --  1.2  CREATININE  --   --   --  0.79  --  0.81  --  0.88  TROPONINIHS 11  --  13  --   --   --   --   --    < > = values in this interval not displayed.    Estimated Creatinine Clearance: 70.5 mL/min (by C-G formula based on SCr of 0.88 mg/dL).   Medical History: Past Medical History:  Diagnosis Date  . Anxiety 10/11  . Aortic stenosis    a. s/p mechcanical AVR, 2002; b. 10/2018 Echo: Triv AI, mean grad 11/2018.  . Bradycardia    chronic, no symptoms 07/2010  . C. difficile colitis   . Carotid bruit    dopplers in past, no abnormalities  . Chronic diastolic CHF (congestive heart failure) (HCC)    a. Echo 03/2015: EF 60-65%, no RWMA, GR1DD, mild BAE, mild to mod MR, mod TR, PASP 65 mmHg; b. 01/2017 Echo: EF 55-60%, NRWMA, grade 1 diastolic dysfunction.  Normal functioning prosthetic aortic valve.  Mean gradient 50 mmHg.  Sev TR. PASP 02/2017; c. 10/2018 Echo: EF 55-60%, Triv AI, mod dil LA, mod-sev TR, PASP 35-40, mild to mod red RV fxn.  . Coronary artery disease    a. mild, cath, 08/2010; b. medically managed  . Decreased hearing    Right ear  .  Depression   . Gastric ulcer   . GERD (gastroesophageal reflux disease)   . Hypertension    BP higher than usual 04/19/10; amlodipine increased by telephone  . Mod-Sev Tricuspid regurgitation    a. 10/2018 Echo: Mod-Sev TR, PASP 35-56mmHg.  43m RLS (restless legs syndrome) 08/23/2015  . S/P AVR    a. [employer]. mechanical 2002; b. echo 08/2010 EF 60%, trival AI, mild MR, AVR working well; c. on longterm warfarin tx  . SOB (shortness of breath) 10/11   08/2010,Episodes at 5 AM, eventually felt to be anxiety, after complete workup including catheterization, pt greatly improved with anxiety meds 11/11    Medications:  Medications Prior to Admission  Medication Sig Dispense Refill Last Dose  . allopurinol (ZYLOPRIM) 300 MG tablet Take 300 mg by mouth daily.    09/13/2020 at 0900  . Cetirizine HCl 10 MG CAPS  Take 10 mg by mouth daily.   09/13/2020 at 0900  . finasteride (PROSCAR) 5 MG tablet Take 5 mg by mouth daily.     09/13/2020 at 0900  . metoprolol succinate (TOPROL-XL) 25 MG 24 hr tablet Take 0.5 tablets (12.5 mg total) by mouth daily. 90 tablet 3 09/13/2020 at 0900  . pantoprazole (PROTONIX) 40 MG tablet Take 40 mg by mouth daily.   09/13/2020 at 0900  . potassium chloride (KLOR-CON) 10 MEQ tablet TAKE 3 TABLETS (30 MEQ) BY MOUTH DAILY. TAKE AN ADDITIONAL 2TABS ON THE DAYS YOU TAKE METOLAZONE 294 tablet 0 09/13/2020 at 0900  . rOPINIRole (REQUIP) 2 MG tablet TAKE 1 TABLET BY MOUTH 4  TIMES DAILY (Patient taking differently: Take 2 mg by mouth in the morning, at noon, in the evening, and at bedtime. Take 1 tablet by mouth 4  times daily) 360 tablet 3 09/13/2020 at 0900  . torsemide (DEMADEX) 20 MG tablet Take 2 tablets (40 mg total) by mouth daily. Take additional 40 mg for 3 pounds weight gain . (Patient taking differently: Take 40 mg by mouth 2 (two) times daily. ) 60 tablet 2 09/13/2020 at 0900  . traZODone (DESYREL) 50 MG tablet Take 50 mg by mouth at bedtime.   09/12/2020 at 1600  .  warfarin (COUMADIN) 2.5 MG tablet Take 1 tablet (2.5 mg total) by mouth daily at 4 PM. Takes daily at 1700 (Patient taking differently: Take 2.5-3.75 mg by mouth daily. Monday, Tuesday, thursday,friday,saturday, sunday 3.75mg -Wednesday)   09/12/2020 at 1600  . Acetaminophen 500 MG capsule Take 2 capsules by mouth every 8 (eight) hours as needed.   PRN at PRN  . busPIRone (BUSPAR) 5 MG tablet Take 1 tablet (5 mg total) by mouth 2 (two) times daily. (Patient not taking: Reported on 09/13/2020)   Not Taking at Unknown time  . DULoxetine (CYMBALTA) 30 MG capsule Take 1 capsule (30 mg total) by mouth daily. (Patient not taking: Reported on 09/13/2020) 30 capsule 3 Not Taking at Unknown time  . metolazone (ZAROXOLYN) 2.5 MG tablet Take 1 tablet (2.5 mg total) by mouth 2 (two) times a week. 72 tablet 3 09/11/2020   Scheduled:  . busPIRone  5 mg Oral BID  . docusate sodium  200 mg Oral BID  . DULoxetine  30 mg Oral Daily  . enoxaparin (LOVENOX) injection  40 mg Subcutaneous Q24H  . furosemide  40 mg Oral Daily  . metoprolol succinate  12.5 mg Oral Daily  . pantoprazole  40 mg Oral BID AC  . polyethylene glycol  17 g Oral Daily  . rOPINIRole  1 mg Oral Daily  . rOPINIRole  2 mg Oral TID  . traZODone  50 mg Oral QHS  . Warfarin - Pharmacist Dosing Inpatient   Does not apply q1600   Infusions:   PRN: acetaminophen **OR** acetaminophen, bisacodyl, morphine injection, ondansetron **OR** ondansetron (ZOFRAN) IV Anti-infectives (From admission, onward)   None      Assessment: Pharmacy consulted to restart patient's warfarin. Anticoagulation was on hold due to GI bleed. Cleared by GI per attending. No major DDI. Hx of bleeding with a Hgb of 4.1 back in September.   Home regimen: warfarin 2.5 mg [id number] and 3.75 mg on Wednesday  Date INR Warfarin Dose  10/20 1.4 baseline  10/25 -- 2.5 mg   10/26 1.2     Goal of Therapy:  INR 2.5 - 3.5  Monitor platelets by anticoagulation protocol: Yes    Plan:  Will  give warfarin 4 mg x 1 tonight and continue enoxaparin 40 mg daily for DVT ppx while INR gets therapeutic. Daily INR ordered. CBC at least every 3 days. Discussed during progression, no bleeding per MD. Molli Knock to dose warfarin to reach target with less caution due to bleeding risk/history.   Terrial Rhodes, PharmD, BCPS 09/19/2020,2:01 PM

## 2020-09-19 NOTE — TOC Progression Note (Signed)
Transition of Care [facility]) - Progression Note    Patient Details  Name: [name] MRN: [id number] Date of Birth: [birth year]  [facility] of [facility]) CM/SW Contact  Maree Krabbe, LCSW Phone Number: 09/19/2020, 10:40 AM  Clinical Narrative:   Patient will d/c on Lovenox. Per [name] at [place] that is not a problem. No bed at SNF today. Possible bed tomorrow. MD is aware.    Expected Discharge Plan: Skilled Nursing Facility Barriers to Discharge: Continued Medical Work up  Expected Discharge Plan and Services Expected Discharge Plan: Skilled Nursing Facility In-house Referral: NA   Post Acute Care Choice: Skilled Nursing Facility Living arrangements for the past 2 months: Assisted Living Facility                                       Social Determinants of Health (SDOH) Interventions    Readmission Risk Interventions Readmission Risk Prevention Plan 09/18/2020 08/10/2020 08/04/2020  Transportation Screening Complete Complete Complete  PCP or Specialist Appt within 3-5 Days Complete Complete -  HRI or Home Care Consult Complete Complete (No Data)  Social Work Consult for Recovery Care Planning/Counseling Complete - Complete  Palliative Care Screening Complete Not Applicable Not Applicable  Medication Review Oceanographer) Complete Complete Complete  Some recent data might be hidden

## 2020-09-19 NOTE — Progress Notes (Signed)
PROGRESS NOTE   HPI was taken from Dr. Para March:  [name] is a 86 y.o. male with medical history significant for aortic stenosis s/p mechanical AVR on Coumadin, chronic diastolic heart failure, hypertension, CAD, chronic venous stasis,chronic anemia,GERD and history of C. difficile, and recent history of  GI hemorrhage in September 2021 with hb of 4, sent from his PCPs office with hemoglobin of 7.4.  He endorses weakness and lightheadedness.Marland Kitchen He denies chest pain, shortness of breath, palpitations, headache or blurred vision or one-sided weakness.  During his recent past hospitalization, Coumadin was 10 and he was treated with vitamin K, Kcentra and FFP.  He underwent colonoscopy that showed diverticulosis with blood in the colon ED Course: On arrival in the emergency room BP was 107/56, going as low as 97/59, pulse in the mid 50s.  Vitals otherwise within normal limits.  Blood work showed hemoglobin of 8.3, down from 10.63 weeks prior.  BMP significant for creatinine of 1.67, up from 0.98 three weeks prior.  INR 1.4 EKG as reviewed by me : Atrial fibrillation with slow ventricular response of 58.  Patient given 500 cc bolus of normal saline and started on 1 unit PRBCs.Marland Kitchen  Hospitalist consulted for admission    [name]  CHY:[id number] DOB: 04/22/1931 DOA: 09/13/2020 PCP: Gracelyn Nurse, MD    Assessment & Plan:   Principal Problem:   Acute blood loss anemia Active Problems:   Hypertension   Coronary artery disease   S/P AVR   Warfarin anticoagulation   History of mechanical aortic valve replacement   Chronic diastolic CHF (congestive heart failure) (HCC)   AKI (acute kidney injury) (HCC)   Permanent atrial fibrillation (HCC)   History of lower GI bleeding   Symptomatic anemia   Acute blood loss anemia & possible GI bleed: etiology unclear, but on warfarin for mechanical heart valve. Hx of lower GI bleed w/ hemorrhagic shock. S/p 1 unit of pRBCs transfused  09/13/20. S/p colonoscopy in 07/2020 which showed diverticulosis & blood in the sigmoid colon. Will continue to monitor H&H. S/p capsule study 09/15/20, which shows copious amounts of coffee ground liquid in entire small bowel and likely small bowel erosions but no clear source like AVMs identified. S/p small bowel enteroscopy 09/17/20 which showed non bleeding duodenal ulcers w/ a clean ulcer base & recently bleeding erosive duodenopathy that was treated w/ argon beam coagulation. Will continue on warfarin and lovenox bridge until INR is therapeutic (2.5-3.5). Pharmacy to dose and monitor.  Hypotension: resolved  Hypokalemia: within normal limits   CAD: continue to aspirin, warfarin, BB & nitrates given bleeding  Hx of mechanical aortic valve replacement: continue warfarin and lovenox bridge until INR is therapeutic (INR 2.5-3.5). Pharmacy to dose and monitor   Chronic diastolic CHF: stable. Continue to monitor I/Os  AKI: resolved  Permanent a. Fib: will continue on metoprolol, warfarin   RLS: continue on home dose of ropinirole  Depression: severity unknown. Continue on home dose of buspar   DVT prophylaxis: warfarin  Code Status: full  Family Communication: discussed pt's care w/ pt's daughter, Cala Bradford, and answered her questions  Disposition Plan:  waiting SNF bed placement   Status is: Inpatient  Remains inpatient appropriate because:Ongoing diagnostic testing needed not appropriate for outpatient work up and IV treatments appropriate due to intensity of illness or inability to take PO, waiting on SNF bed is available    Dispo: The patient is from: Home  Anticipated d/c is to: SNF               Anticipated d/c date is: whenever SNF bed is available               Patient currently medically stable but will need to go on lovenox for bridge with warfarin until INR is therapeutic (2.5-3.5)    Consultants:   GI    Procedures:   Antimicrobials:     Subjective: Pt c/o malaise   Objective: Vitals:   09/18/20 2004 09/18/20 2100 09/19/20 0430 09/19/20 0641  BP: (!) 142/53  (!) 115/93   Pulse: 61  60   Resp: 20 18 18    Temp: (!) 97.5 F (36.4 C)  97.7 F (36.5 C)   TempSrc: Oral  Oral   SpO2: 90% 97% 90%   Weight:    97.7 kg  Height:        Intake/Output Summary (Last 24 hours) at 09/19/2020 0814 Last data filed at 09/19/2020 0200 Gross per 24 hour  Intake 240 ml  Output 750 ml  Net -510 ml   Filed Weights   09/17/20 0854 09/18/20 0634 09/19/20 0641  Weight: 96.6 kg 95.9 kg 97.7 kg    Examination:  General exam: Appears calm & comfortable. Hard of hearing  Respiratory system: diminished breath sounds b/l. No wheezes Cardiovascular system: irregularly irregular. No rubs or gallops Gastrointestinal system: Abdomen is obese, soft and nontender. Hypoactive bowel sounds heard. Central nervous system: alert and oriented. Moves all 4 extremities  Psychiatry: judgement and insight appear normal. Flat mood and affect     Data Reviewed: I have personally reviewed following labs and imaging studies  CBC: Recent Labs  Lab 09/16/20 0542 09/16/20 2219 09/17/20 0517 09/18/20 0653 09/19/20 0532  WBC 7.4 7.2 7.2 5.7 4.5  HGB 9.4* 9.1* 9.4* 9.1* 9.0*  HCT 28.4* 28.4* 28.8* 28.4* 28.9*  MCV 89.9 91.3 91.1 91.9 92.0  PLT 222 246 227 218 209   Basic Metabolic Panel: Recent Labs  Lab 09/15/20 0438 09/16/20 0542 09/17/20 0517 09/18/20 0607 09/19/20 0532  NA 133* 135 134* 135 136  K 3.5 3.4* 3.5 3.7 3.8  CL 98 100 100 100 99  CO2 26 26 26 26 28   GLUCOSE 102* 104* 99 91 88  BUN 39* 25* 17 16 18   CREATININE 1.21 0.91 0.79 0.81 0.88  CALCIUM 8.3* 8.6* 8.5* 8.4* 8.8*  MG 2.7* 2.9* 2.7* 2.4 2.5*   GFR: Estimated Creatinine Clearance: 70.5 mL/min (by C-G formula based on SCr of 0.88 mg/dL). Liver Function Tests: Recent Labs  Lab 09/13/20 1240  AST 20  ALT 10  ALKPHOS 108  BILITOT 0.8  PROT 6.1*  ALBUMIN  3.5   No results for input(s): LIPASE, AMYLASE in the last 168 hours. No results for input(s): AMMONIA in the last 168 hours. Coagulation Profile: Recent Labs  Lab 09/13/20 1128 09/19/20 0532  INR 1.4* 1.2   Cardiac Enzymes: No results for input(s): CKTOTAL, CKMB, CKMBINDEX, TROPONINI in the last 168 hours. BNP (last 3 results) No results for input(s): PROBNP in the last 8760 hours. HbA1C: No results for input(s): HGBA1C in the last 72 hours. CBG: Recent Labs  Lab 09/16/20 2133  GLUCAP 131*   Lipid Profile: No results for input(s): CHOL, HDL, LDLCALC, TRIG, CHOLHDL, LDLDIRECT in the last 72 hours. Thyroid Function Tests: No results for input(s): TSH, T4TOTAL, FREET4, T3FREE, THYROIDAB in the last 72 hours. Anemia Panel: No results for input(s): VITAMINB12, FOLATE, FERRITIN,  TIBC, IRON, RETICCTPCT in the last 72 hours. Sepsis Labs: No results for input(s): PROCALCITON, LATICACIDVEN in the last 168 hours.  Recent Results (from the past 240 hour(s))  Respiratory Panel by RT PCR (Flu A&B, Covid) - Nasopharyngeal Swab     Status: None   Collection Time: 09/13/20  5:55 PM   Specimen: Nasopharyngeal Swab  Result Value Ref Range Status   SARS Coronavirus 2 by RT PCR NEGATIVE NEGATIVE Final    Comment: (NOTE) SARS-CoV-2 target nucleic acids are NOT DETECTED.  The SARS-CoV-2 RNA is generally detectable in upper respiratoy specimens during the acute phase of infection. The lowest concentration of SARS-CoV-2 viral copies this assay can detect is 131 copies/mL. A negative result does not preclude SARS-Cov-2 infection and should not be used as the sole basis for treatment or other patient management decisions. A negative result may occur with  improper specimen collection/handling, submission of specimen other than nasopharyngeal swab, presence of viral mutation(s) within the areas targeted by this assay, and inadequate number of viral copies (<131 copies/mL). A negative result  must be combined with clinical observations, patient history, and epidemiological information. The expected result is Negative.  Fact Sheet for Patients:  https://www.moore.com/  Fact Sheet for Healthcare Providers:  https://www.young.biz/  This test is no t yet approved or cleared by the [place] FDA and  has been authorized for detection and/or diagnosis of SARS-CoV-2 by FDA under an Emergency Use Authorization (EUA). This EUA will remain  in effect (meaning this test can be used) for the duration of the COVID-19 declaration under Section 564(b)(1) of the Act, 21 U.S.C. section 360bbb-3(b)(1), unless the authorization is terminated or revoked sooner.     Influenza A by PCR NEGATIVE NEGATIVE Final   Influenza B by PCR NEGATIVE NEGATIVE Final    Comment: (NOTE) The Xpert Xpress SARS-CoV-2/FLU/RSV assay is intended as an aid in  the diagnosis of influenza from Nasopharyngeal swab specimens and  should not be used as a sole basis for treatment. Nasal washings and  aspirates are unacceptable for Xpert Xpress SARS-CoV-2/FLU/RSV  testing.  Fact Sheet for Patients: https://www.moore.com/  Fact Sheet for Healthcare Providers: https://www.young.biz/  This test is not yet approved or cleared by the [place] FDA and  has been authorized for detection and/or diagnosis of SARS-CoV-2 by  FDA under an Emergency Use Authorization (EUA). This EUA will remain  in effect (meaning this test can be used) for the duration of the  Covid-19 declaration under Section 564(b)(1) of the Act, 21  U.S.C. section 360bbb-3(b)(1), unless the authorization is  terminated or revoked. Performed at [facility], [address], [place], [place] [address]          Radiology Studies: No results found.      Scheduled Meds: . busPIRone  5 mg Oral BID  . docusate sodium  200 mg Oral BID  . DULoxetine   30 mg Oral Daily  . enoxaparin (LOVENOX) injection  40 mg Subcutaneous Q24H  . metoprolol succinate  12.5 mg Oral Daily  . pantoprazole  40 mg Oral BID AC  . polyethylene glycol  17 g Oral Daily  . rOPINIRole  1 mg Oral Daily  . rOPINIRole  2 mg Oral TID  . traZODone  50 mg Oral QHS  . Warfarin - Pharmacist Dosing Inpatient   Does not apply q1600   Continuous Infusions:    LOS: 6 days    Time spent: 32 mins     Charise Killian,  MD [facility] Pager [phone]  If 7PM-7AM, please contact night-coverage [email or web address] 09/19/2020, 8:14 AM

## 2020-09-19 NOTE — Plan of Care (Signed)

## 2020-09-20 ENCOUNTER — Ambulatory Visit: Payer: Medicare Other | Admitting: Family

## 2020-09-20 ENCOUNTER — Encounter: Payer: Self-pay | Admitting: Internal Medicine

## 2020-09-20 DIAGNOSIS — I5032 Chronic diastolic (congestive) heart failure: Secondary | ICD-10-CM

## 2020-09-20 DIAGNOSIS — Z7901 Long term (current) use of anticoagulants: Secondary | ICD-10-CM | POA: Diagnosis not present

## 2020-09-20 DIAGNOSIS — D62 Acute posthemorrhagic anemia: Secondary | ICD-10-CM | POA: Diagnosis not present

## 2020-09-20 DIAGNOSIS — Z952 Presence of prosthetic heart valve: Secondary | ICD-10-CM

## 2020-09-20 DIAGNOSIS — I4821 Permanent atrial fibrillation: Secondary | ICD-10-CM | POA: Diagnosis not present

## 2020-09-20 LAB — BASIC METABOLIC PANEL
Anion gap: 8 (ref 5–15)
BUN: 19 mg/dL (ref 8–23)
CO2: 26 mmol/L (ref 22–32)
Calcium: 8.8 mg/dL — ABNORMAL LOW (ref 8.9–10.3)
Chloride: 101 mmol/L (ref 98–111)
Creatinine, Ser: 0.85 mg/dL (ref 0.61–1.24)
GFR, Estimated: >60 mL/min (ref >60)
Glucose, Bld: 99 mg/dL (ref 70–99)
Potassium: 4 mmol/L (ref 3.5–5.1)
Sodium: 135 mmol/L (ref 135–145)

## 2020-09-20 LAB — CBC
HCT: 30 % — ABNORMAL LOW (ref 39.0–52.0)
Hemoglobin: 9.6 g/dL — ABNORMAL LOW (ref 13.0–17.0)
MCH: 29.7 pg (ref 26.0–34.0)
MCHC: 32 g/dL (ref 30.0–36.0)
MCV: 92.9 fL (ref 80.0–100.0)
Platelets: 211 10*3/uL (ref 150–400)
RBC: 3.23 MIL/uL — ABNORMAL LOW (ref 4.22–5.81)
RDW: 17.1 % — ABNORMAL HIGH (ref 11.5–15.5)
WBC: 4.2 10*3/uL (ref 4.0–10.5)
nRBC: 0 % (ref 0.0–0.2)

## 2020-09-20 LAB — PROTIME-INR
INR: 1.2 (ref 0.8–1.2)
Prothrombin Time: 15 seconds (ref 11.4–15.2)

## 2020-09-20 LAB — APTT: aPTT: 124 seconds — ABNORMAL HIGH (ref 24–36)

## 2020-09-20 LAB — HEPARIN LEVEL (UNFRACTIONATED): Heparin Unfractionated: 0.23 IU/mL — ABNORMAL LOW (ref 0.30–0.70)

## 2020-09-20 MED ORDER — HEPARIN BOLUS VIA INFUSION
2400.0000 [IU] | Freq: Once | INTRAVENOUS | Status: AC
  Administered 2020-09-20: 2400 [IU] via INTRAVENOUS
  Filled 2020-09-20: qty 2400

## 2020-09-20 MED ORDER — HEPARIN BOLUS VIA INFUSION
1000.0000 [IU] | Freq: Once | INTRAVENOUS | Status: AC
  Administered 2020-09-20: 1000 [IU] via INTRAVENOUS
  Filled 2020-09-20: qty 1000

## 2020-09-20 MED ORDER — HEPARIN (PORCINE) 25000 UT/250ML-% IV SOLN
1600.0000 [IU]/h | INTRAVENOUS | Status: DC
  Administered 2020-09-20 (×2): 1300 [IU]/h via INTRAVENOUS
  Administered 2020-09-21: 1450 [IU]/h via INTRAVENOUS
  Administered 2020-09-22: 1500 [IU]/h via INTRAVENOUS
  Administered 2020-09-24: 1600 [IU]/h via INTRAVENOUS
  Filled 2020-09-20 (×5): qty 250

## 2020-09-20 MED ORDER — WARFARIN SODIUM 4 MG PO TABS
4.0000 mg | ORAL_TABLET | Freq: Once | ORAL | Status: AC
  Administered 2020-09-20: 4 mg via ORAL
  Filled 2020-09-20: qty 1

## 2020-09-20 NOTE — Progress Notes (Signed)
Mobility Specialist - Progress Note   09/20/20 1221  Mobility  Activity Transferred:  Bed to chair  Level of Assistance Standby assist, set-up cues, supervision of patient - no hands on (CGA for safety)  Assistive Device Front wheel walker  Distance Ambulated (ft) 15 ft  Mobility Response Tolerated well  Mobility performed by Mobility specialist  $Mobility charge 1 Mobility    Pt requested to transfer to recliner. Mobility specialist provided assistance. Pt transferred to recliner using a RW w/ SBA. CGA utilized for safety. No LOB noted. No c/o pain or SOB. O2 sat > 90%. Pt on RA t/o session. Overall, pt tolerated session well. Pt left sitting on recliner w/ lunch placed in front of him. All needs placed in reach.    Janard Culp Mobility Specialist  09/20/20, 12:24 PM

## 2020-09-20 NOTE — Plan of Care (Signed)
No acute complaints or changes. VSS. Problem: Education: Goal: Knowledge of General Education information will improve Description: Including pain rating scale, medication(s)/side effects and non-pharmacologic comfort measures Outcome: Progressing   Problem: Health Behavior/Discharge Planning: Goal: Ability to manage health-related needs will improve Outcome: Progressing   Problem: Clinical Measurements: Goal: Ability to maintain clinical measurements within normal limits will improve Outcome: Progressing Goal: Will remain free from infection Outcome: Progressing Goal: Diagnostic test results will improve Outcome: Progressing Goal: Respiratory complications will improve Outcome: Progressing Goal: Cardiovascular complication will be avoided Outcome: Progressing   Problem: Activity: Goal: Risk for activity intolerance will decrease Outcome: Progressing   Problem: Nutrition: Goal: Adequate nutrition will be maintained Outcome: Progressing   Problem: Coping: Goal: Level of anxiety will decrease Outcome: Progressing   Problem: Elimination: Goal: Will not experience complications related to bowel motility Outcome: Progressing Goal: Will not experience complications related to urinary retention Outcome: Progressing   Problem: Pain Managment: Goal: General experience of comfort will improve Outcome: Progressing   Problem: Safety: Goal: Ability to remain free from injury will improve Outcome: Progressing  Problem: Skin Integrity: Goal: Risk for impaired skin integrity will decrease Outcome: Progressing   Problem: Education: Goal: Ability to identify signs and symptoms of gastrointestinal bleeding will improve Outcome: Progressing   Problem: Bowel/Gastric: Goal: Will show no signs and symptoms of gastrointestinal bleeding Outcome: Progressing   Problem: Fluid Volume: Goal: Will show no signs and symptoms of excessive bleeding Outcome: Progressing   Problem:  Clinical Measurements: Goal: Complications related to the disease process, condition or treatment will be avoided or minimized Outcome: Progressing

## 2020-09-20 NOTE — Progress Notes (Addendum)
Progress Note    [name]  YHC:[id number] DOB: 1931-08-12  DOA: 09/13/2020 PCP: Gracelyn Nurse, MD      Brief Narrative:    Medical records reviewed and are as summarized below:  [name] is a 86 y.o. male with medical history significant for aortic stenosis status post mechanical aortic valve replacement on Coumadin, permanent atrial fibrillation, chronic diastolic CHF, hypertension, CAD, chronic venous stasis, chronic anemia, GERD, history of C. difficile infection, recent GI bleed in September 2021) severe anemia with hemoglobin of 4.  He was referred to the emergency room at the behest of his primary care physician because of generalized weakness, lightheadedness and anemia with hemoglobin of 7.4.  He was admitted to the hospital for acute blood loss anemia likely from GI bleed.    Assessment/Plan:   Principal Problem:   Acute blood loss anemia Active Problems:   Hypertension   Coronary artery disease   S/P AVR   Warfarin anticoagulation   History of mechanical aortic valve replacement   Chronic diastolic CHF (congestive heart failure) (HCC)   AKI (acute kidney injury) (HCC)   Permanent atrial fibrillation (HCC)   History of lower GI bleeding   Symptomatic anemia   Body mass index is 30.04 kg/m.  (Obesity)  Acute blood loss anemia secondary to GI bleeding: s/p capsule endoscopy on 09/16/2019 which showed copious amount of coffee-ground liquid in the entire small bowel and likely small bowel evaluations.  S/p small bowel enteroscopy on 09/17/2020.  This showed nonbleeding duodenal ulcers with a clean ulcer base and "recent bleeding erosive to adenopathy treated with argon beam coagulation.  Continue Protonix. S/p blood transfusion with 1 unit of PRBCs on 09/13/2020  Permanent atrial fibrillation, status post mechanical aortic valve replacement: INR subtherapeutic at 1.2.  Continue Coumadin.  However, patient will need IV heparin bridge.   Consulted cardiologist for further recommendations given recent GI bleed.  He recommended IV heparin infusion and patient has been started on same.  Monitor heparin level per protocol.  Case discussed with Dr. Azucena Cecil, cardiologist  CAD: Continue metoprolol, aspirin Coumadin  Chronic diastolic CHF: Compensated.     Diet Order            Diet Heart Room service appropriate? Yes; Fluid consistency: Thin  Diet effective now                    Consultants:  Gastroenterologist  Cardiologist  Procedures:  Small bowel endoscopy    Medications:   . busPIRone  5 mg Oral BID  . docusate sodium  200 mg Oral BID  . DULoxetine  30 mg Oral Daily  . furosemide  40 mg Oral Daily  . metoprolol succinate  12.5 mg Oral Daily  . pantoprazole  40 mg Oral BID AC  . polyethylene glycol  17 g Oral Daily  . rOPINIRole  1 mg Oral Daily  . rOPINIRole  2 mg Oral TID  . traZODone  50 mg Oral QHS  . warfarin  4 mg Oral ONCE-1600  . Warfarin - Pharmacist Dosing Inpatient   Does not apply q1600   Continuous Infusions: . heparin 1,300 Units/hr (09/20/20 1248)     Anti-infectives (From admission, onward)   None             Family Communication/Anticipated D/C date and plan/Code Status   DVT prophylaxis: SCDs Start: 09/13/20 1948 warfarin (COUMADIN) tablet 4 mg     Code Status: Full Code  Family Communication: Plan discussed with his daughter, Cala Bradford Disposition Plan:    Status is: Inpatient  Remains inpatient appropriate because:IV treatments appropriate due to intensity of illness or inability to take PO and Mechanical aortic valve with subtherapeutic INR   Dispo: The patient is from: Home              Anticipated d/c is to: SNF              Anticipated d/c date is: 2 days              Patient currently is not medically stable to d/c.           Subjective:   No rectal bleeding or hematemesis.  No shortness of breath or chest pain.  Interval events  noted.  Objective:    Vitals:   09/19/20 2013 09/20/20 0420 09/20/20 0753 09/20/20 1144  BP: 128/68 124/79 130/76 126/82  Pulse: 60 62 (!) 58 (!) 58  Resp: 16 16 18 19   Temp: 97.7 F (36.5 C) 97.6 F (36.4 C) (!) 97.5 F (36.4 C) 98.4 F (36.9 C)  TempSrc: Oral Oral Oral   SpO2: 99% 98% 94% 93%  Weight:      Height:       No data found.   Intake/Output Summary (Last 24 hours) at 09/20/2020 1440 Last data filed at 09/20/2020 1033 Gross per 24 hour  Intake 600 ml  Output 625 ml  Net -25 ml   Filed Weights   09/17/20 0854 09/18/20 0634 09/19/20 0641  Weight: 96.6 kg 95.9 kg 97.7 kg    Exam:  GEN: NAD SKIN: No rash EYES: EOMI ENT: MMM CV: RRR PULM: CTA B ABD: soft, ND, NT, +BS CNS: AAO x 3, non focal EXT: No edema or tenderness   Data Reviewed:   I have personally reviewed following labs and imaging studies:  Labs: Labs show the following:   Basic Metabolic Panel: Recent Labs  Lab 09/15/20 0438 09/15/20 0438 09/16/20 0542 09/16/20 0542 09/17/20 0517 09/17/20 0517 09/18/20 0607 09/18/20 0607 09/19/20 0532 09/20/20 0629  NA 133*   < > 135  --  134*  --  135  --  136 135  K 3.5   < > 3.4*   < > 3.5   < > 3.7   < > 3.8 4.0  CL 98   < > 100  --  100  --  100  --  99 101  CO2 26   < > 26  --  26  --  26  --  28 26  GLUCOSE 102*   < > 104*  --  99  --  91  --  88 99  BUN 39*   < > 25*  --  17  --  16  --  18 19  CREATININE 1.21   < > 0.91  --  0.79  --  0.81  --  0.88 0.85  CALCIUM 8.3*   < > 8.6*  --  8.5*  --  8.4*  --  8.8* 8.8*  MG 2.7*  --  2.9*  --  2.7*  --  2.4  --  2.5*  --    < > = values in this interval not displayed.   GFR Estimated Creatinine Clearance: 73 mL/min (by C-G formula based on SCr of 0.85 mg/dL). Liver Function Tests: No results for input(s): AST, ALT, ALKPHOS, BILITOT, PROT, ALBUMIN in the last 168 hours. No results for  input(s): LIPASE, AMYLASE in the last 168 hours. No results for input(s): AMMONIA in the last 168  hours. Coagulation profile Recent Labs  Lab 09/19/20 0532 09/20/20 0629  INR 1.2 1.2    CBC: Recent Labs  Lab 09/16/20 2219 09/17/20 0517 09/18/20 0653 09/19/20 0532 09/20/20 0629  WBC 7.2 7.2 5.7 4.5 4.2  HGB 9.1* 9.4* 9.1* 9.0* 9.6*  HCT 28.4* 28.8* 28.4* 28.9* 30.0*  MCV 91.3 91.1 91.9 92.0 92.9  PLT 246 227 218 209 211   Cardiac Enzymes: No results for input(s): CKTOTAL, CKMB, CKMBINDEX, TROPONINI in the last 168 hours. BNP (last 3 results) No results for input(s): PROBNP in the last 8760 hours. CBG: Recent Labs  Lab 09/16/20 2133  GLUCAP 131*   D-Dimer: No results for input(s): DDIMER in the last 72 hours. Hgb A1c: No results for input(s): HGBA1C in the last 72 hours. Lipid Profile: No results for input(s): CHOL, HDL, LDLCALC, TRIG, CHOLHDL, LDLDIRECT in the last 72 hours. Thyroid function studies: No results for input(s): TSH, T4TOTAL, T3FREE, THYROIDAB in the last 72 hours.  Invalid input(s): FREET3 Anemia work up: No results for input(s): VITAMINB12, FOLATE, FERRITIN, TIBC, IRON, RETICCTPCT in the last 72 hours. Sepsis Labs: Recent Labs  Lab 09/17/20 0517 09/18/20 0653 09/19/20 0532 09/20/20 0629  WBC 7.2 5.7 4.5 4.2    Microbiology Recent Results (from the past 240 hour(s))  Respiratory Panel by RT PCR (Flu A&B, Covid) - Nasopharyngeal Swab     Status: None   Collection Time: 09/13/20  5:55 PM   Specimen: Nasopharyngeal Swab  Result Value Ref Range Status   SARS Coronavirus 2 by RT PCR NEGATIVE NEGATIVE Final    Comment: (NOTE) SARS-CoV-2 target nucleic acids are NOT DETECTED.  The SARS-CoV-2 RNA is generally detectable in upper respiratoy specimens during the acute phase of infection. The lowest concentration of SARS-CoV-2 viral copies this assay can detect is 131 copies/mL. A negative result does not preclude SARS-Cov-2 infection and should not be used as the sole basis for treatment or other patient management decisions. A negative  result may occur with  improper specimen collection/handling, submission of specimen other than nasopharyngeal swab, presence of viral mutation(s) within the areas targeted by this assay, and inadequate number of viral copies (<131 copies/mL). A negative result must be combined with clinical observations, patient history, and epidemiological information. The expected result is Negative.  Fact Sheet for Patients:  https://www.moore.com/https://www.fda.gov/media/142436/download  Fact Sheet for Healthcare Providers:  https://www.young.biz/https://www.fda.gov/media/142435/download  This test is no t yet approved or cleared by the [place] FDA and  has been authorized for detection and/or diagnosis of SARS-CoV-2 by FDA under an Emergency Use Authorization (EUA). This EUA will remain  in effect (meaning this test can be used) for the duration of the COVID-19 declaration under Section 564(b)(1) of the Act, 21 U.S.C. section 360bbb-3(b)(1), unless the authorization is terminated or revoked sooner.     Influenza A by PCR NEGATIVE NEGATIVE Final   Influenza B by PCR NEGATIVE NEGATIVE Final    Comment: (NOTE) The Xpert Xpress SARS-CoV-2/FLU/RSV assay is intended as an aid in  the diagnosis of influenza from Nasopharyngeal swab specimens and  should not be used as a sole basis for treatment. Nasal washings and  aspirates are unacceptable for Xpert Xpress SARS-CoV-2/FLU/RSV  testing.  Fact Sheet for Patients: https://www.moore.com/https://www.fda.gov/media/142436/download  Fact Sheet for Healthcare Providers: https://www.young.biz/https://www.fda.gov/media/142435/download  This test is not yet approved or cleared by the [place] FDA and  has been authorized for detection  and/or diagnosis of SARS-CoV-2 by  FDA under an Emergency Use Authorization (EUA). This EUA will remain  in effect (meaning this test can be used) for the duration of the  Covid-19 declaration under Section 564(b)(1) of the Act, 21  U.S.C. section 360bbb-3(b)(1), unless the authorization is   terminated or revoked. Performed at [facility], [address] [place], [place] [address]     Procedures and diagnostic studies:  No results found.             LOS: 7 days   Deetya Drouillard  [facility]   Pager on [email or web address]. If 7PM-7AM, please contact night-coverage at [email or web address]     09/20/2020, 2:40 PM

## 2020-09-20 NOTE — Consult Note (Signed)
ANTICOAGULATION CONSULT NOTE   Pharmacy Consult for Heparin and warfarin Indication: atrial fibrillation and mAVR  Allergies  Allergen Reactions  . Sulfa Antibiotics Other (See Comments)    Reaction:  Unknown   . Levofloxacin Nausea Only    Patient Measurements: Height: 5\' 11"  (180.3 cm) Weight: 97.7 kg (215 lb 6.4 oz) IBW/kg (Calculated) : 75.3 Heparin Dosing Weight: 94.9 kg  Vital Signs: Temp: 98.4 F (36.9 C) (10/27 1144) Temp Source: Oral (10/27 0753) BP: 126/82 (10/27 1144) Pulse Rate: 58 (10/27 1144)  Labs: Recent Labs    09/18/20 0607 09/18/20 0653 09/18/20 0653 09/19/20 0532 09/20/20 0629  HGB  --  9.1*   < > 9.0* 9.6*  HCT  --  28.4*  --  28.9* 30.0*  PLT  --  218  --  209 211  LABPROT  --   --   --  14.7 15.0  INR  --   --   --  1.2 1.2  CREATININE 0.81  --   --  0.88 0.85   < > = values in this interval not displayed.    Estimated Creatinine Clearance: 73 mL/min (by C-G formula based on SCr of 0.85 mg/dL).   Medical History: Past Medical History:  Diagnosis Date  . Anxiety 10/11  . Aortic stenosis    a. s/p mechcanical AVR, 2002; b. 10/2018 Echo: Triv AI, mean grad 11/2018.  . Bradycardia    chronic, no symptoms 07/2010  . C. difficile colitis   . Carotid bruit    dopplers in past, no abnormalities  . Chronic diastolic CHF (congestive heart failure) (HCC)    a. Echo 03/2015: EF 60-65%, no RWMA, GR1DD, mild BAE, mild to mod MR, mod TR, PASP 65 mmHg; b. 01/2017 Echo: EF 55-60%, NRWMA, grade 1 diastolic dysfunction.  Normal functioning prosthetic aortic valve.  Mean gradient 50 mmHg.  Sev TR. PASP 02/2017; c. 10/2018 Echo: EF 55-60%, Triv AI, mod dil LA, mod-sev TR, PASP 35-40, mild to mod red RV fxn.  . Coronary artery disease    a. mild, cath, 08/2010; b. medically managed  . Decreased hearing    Right ear  . Depression   . Gastric ulcer   . GERD (gastroesophageal reflux disease)   . Hypertension    BP higher than usual 04/19/10; amlodipine  increased by telephone  . Mod-Sev Tricuspid regurgitation    a. 10/2018 Echo: Mod-Sev TR, PASP 35-99mmHg.  43m RLS (restless legs syndrome) 08/23/2015  . S/P AVR    a. St. Jude. mechanical 2002; b. echo 08/2010 EF 60%, trival AI, mild MR, AVR working well; c. on longterm warfarin tx  . SOB (shortness of breath) 10/11   08/2010,Episodes at 5 AM, eventually felt to be anxiety, after complete workup including catheterization, pt greatly improved with anxiety meds 11/11    Medications:  Medications Prior to Admission  Medication Sig Dispense Refill Last Dose  . allopurinol (ZYLOPRIM) 300 MG tablet Take 300 mg by mouth daily.    09/13/2020 at 0900  . Cetirizine HCl 10 MG CAPS Take 10 mg by mouth daily.   09/13/2020 at 0900  . finasteride (PROSCAR) 5 MG tablet Take 5 mg by mouth daily.     09/13/2020 at 0900  . metoprolol succinate (TOPROL-XL) 25 MG 24 hr tablet Take 0.5 tablets (12.5 mg total) by mouth daily. 90 tablet 3 09/13/2020 at 0900  . pantoprazole (PROTONIX) 40 MG tablet Take 40 mg by mouth daily.   09/13/2020 at 0900  .  potassium chloride (KLOR-CON) 10 MEQ tablet TAKE 3 TABLETS (30 MEQ) BY MOUTH DAILY. TAKE AN ADDITIONAL 2TABS ON THE DAYS YOU TAKE METOLAZONE 294 tablet 0 09/13/2020 at 0900  . rOPINIRole (REQUIP) 2 MG tablet TAKE 1 TABLET BY MOUTH 4  TIMES DAILY (Patient taking differently: Take 2 mg by mouth in the morning, at noon, in the evening, and at bedtime. Take 1 tablet by mouth 4  times daily) 360 tablet 3 09/13/2020 at 0900  . torsemide (DEMADEX) 20 MG tablet Take 2 tablets (40 mg total) by mouth daily. Take additional 40 mg for 3 pounds weight gain . (Patient taking differently: Take 40 mg by mouth 2 (two) times daily. ) 60 tablet 2 09/13/2020 at 0900  . traZODone (DESYREL) 50 MG tablet Take 50 mg by mouth at bedtime.   09/12/2020 at 1600  . warfarin (COUMADIN) 2.5 MG tablet Take 1 tablet (2.5 mg total) by mouth daily at 4 PM. Takes daily at 1700 (Patient taking differently: Take  2.5-3.75 mg by mouth daily. Monday, Tuesday, thursday,friday,saturday, sunday 3.75mg -Wednesday)   09/12/2020 at 1600  . Acetaminophen 500 MG capsule Take 2 capsules by mouth every 8 (eight) hours as needed.   PRN at PRN  . busPIRone (BUSPAR) 5 MG tablet Take 1 tablet (5 mg total) by mouth 2 (two) times daily. (Patient not taking: Reported on 09/13/2020)   Not Taking at Unknown time  . DULoxetine (CYMBALTA) 30 MG capsule Take 1 capsule (30 mg total) by mouth daily. (Patient not taking: Reported on 09/13/2020) 30 capsule 3 Not Taking at Unknown time  . metolazone (ZAROXOLYN) 2.5 MG tablet Take 1 tablet (2.5 mg total) by mouth 2 (two) times a week. 72 tablet 3 09/11/2020   Scheduled:  . busPIRone  5 mg Oral BID  . docusate sodium  200 mg Oral BID  . DULoxetine  30 mg Oral Daily  . furosemide  40 mg Oral Daily  . heparin  2,400 Units Intravenous Once  . metoprolol succinate  12.5 mg Oral Daily  . pantoprazole  40 mg Oral BID AC  . polyethylene glycol  17 g Oral Daily  . rOPINIRole  1 mg Oral Daily  . rOPINIRole  2 mg Oral TID  . traZODone  50 mg Oral QHS  . warfarin  4 mg Oral ONCE-1600  . Warfarin - Pharmacist Dosing Inpatient   Does not apply q1600   Infusions:  . heparin     PRN: acetaminophen **OR** acetaminophen, bisacodyl, morphine injection, ondansetron **OR** ondansetron (ZOFRAN) IV Anti-infectives (From admission, onward)   None      Assessment: Pharmacy was consulted to start heparin and restart warfarin for mAVR and afib. INR is currently @1 .2. Provider wants to start heparin bridge until INR is therapeutic. Pt has an hx of GI bleeds. GI cleared patient to restart anticoagulation. NO major DDI with warfarin.   Home dose warfarin:  warfarin 2.5 mg MTuThFSaSu and 3.75 mg on Wednesday  Date INR Warfarin Dose  10/20 1.4 baseline  10/25 -- 2.5 mg   10/26 1.2 4.0 mg  10/27 1.2    Goal of Therapy:  INR 2.5 to 3.5 and Heparin level 0.3 to 0.7 Monitor platelets by  anticoagulation protocol: Yes   Plan:  Give 2400 units bolus x 1. Will give only half bolus due to risk of bleed and pt received enoxaparin last night.  Start heparin infusion at 1300 units/hr Check anti-Xa level in 8 hours and daily while on heparin Monitor CBC daily  while on heparin.   INR subtherapeutic. Will give warfarin 4 mg x 1 tonight. (0.25 mg greater than home dose). Pt received warfarin 4 mg yesterday and we will most likely see the effects tomorrow. Daily INR ordered.   Bridge heparin with warfarin until INR is therapeutic and can d/c heparin infusion.   Plan to have INR therapeutic prior to discharge per MD.   Ronnald Ramp, PharmD, BCPS  09/20/2020,12:28 PM

## 2020-09-20 NOTE — Consult Note (Signed)
ANTICOAGULATION CONSULT NOTE   Pharmacy Consult for Heparin and warfarin Indication: atrial fibrillation and mAVR  Patient Measurements: Height: 5\' 11"  (180.3 cm) Weight: 97.7 kg (215 lb 6.4 oz) IBW/kg (Calculated) : 75.3 Heparin Dosing Weight: 94.9 kg  Labs: Recent Labs    09/18/20 0607 09/18/20 0653 09/18/20 0653 09/19/20 0532 09/20/20 0629 09/20/20 1344 09/20/20 2139  HGB  --  9.1*   < > 9.0* 9.6*  --   --   HCT  --  28.4*  --  28.9* 30.0*  --   --   PLT  --  218  --  209 211  --   --   APTT  --   --   --   --   --  124*  --   LABPROT  --   --   --  14.7 15.0  --   --   INR  --   --   --  1.2 1.2  --   --   HEPARINUNFRC  --   --   --   --   --   --  0.23*  CREATININE 0.81  --   --  0.88 0.85  --   --    < > = values in this interval not displayed.    Estimated Creatinine Clearance: 73 mL/min (by C-G formula based on SCr of 0.85 mg/dL).   Medical History: Past Medical History:  Diagnosis Date  . Anxiety 10/11  . Aortic stenosis    a. s/p mechcanical AVR, 2002; b. 10/2018 Echo: Triv AI, mean grad 11/2018.  . Bradycardia    chronic, no symptoms 07/2010  . C. difficile colitis   . Carotid bruit    dopplers in past, no abnormalities  . Chronic diastolic CHF (congestive heart failure) (HCC)    a. Echo 03/2015: EF 60-65%, no RWMA, GR1DD, mild BAE, mild to mod MR, mod TR, PASP 65 mmHg; b. 01/2017 Echo: EF 55-60%, NRWMA, grade 1 diastolic dysfunction.  Normal functioning prosthetic aortic valve.  Mean gradient 50 mmHg.  Sev TR. PASP 02/2017; c. 10/2018 Echo: EF 55-60%, Triv AI, mod dil LA, mod-sev TR, PASP 35-40, mild to mod red RV fxn.  . Coronary artery disease    a. mild, cath, 08/2010; b. medically managed  . Decreased hearing    Right ear  . Depression   . Gastric ulcer   . GERD (gastroesophageal reflux disease)   . Hypertension    BP higher than usual 04/19/10; amlodipine increased by telephone  . Mod-Sev Tricuspid regurgitation    a. 10/2018 Echo: Mod-Sev TR, PASP  35-58mmHg.  43m RLS (restless legs syndrome) 08/23/2015  . S/P AVR    a. St. Jude. mechanical 2002; b. echo 08/2010 EF 60%, trival AI, mild MR, AVR working well; c. on longterm warfarin tx  . SOB (shortness of breath) 10/11   08/2010,Episodes at 5 AM, eventually felt to be anxiety, after complete workup including catheterization, pt greatly improved with anxiety meds 11/11   Scheduled:  . busPIRone  5 mg Oral BID  . docusate sodium  200 mg Oral BID  . DULoxetine  30 mg Oral Daily  . furosemide  40 mg Oral Daily  . metoprolol succinate  12.5 mg Oral Daily  . pantoprazole  40 mg Oral BID AC  . polyethylene glycol  17 g Oral Daily  . rOPINIRole  1 mg Oral Daily  . rOPINIRole  2 mg Oral TID  . traZODone  50 mg Oral  QHS  . Warfarin - Pharmacist Dosing Inpatient   Does not apply q1600   Infusions:  . heparin 1,300 Units/hr (09/20/20 1248)   Assessment: Pharmacy was consulted to start heparin and restart warfarin for mAVR and afib. INR is currently @1 .2. Provider wants to start heparin bridge until INR is therapeutic. Pt has an hx of GI bleeds. GI cleared patient to restart anticoagulation. NO major DDI with warfarin.   Home dose warfarin:  warfarin 2.5 mg MTuThFSaSu and 3.75 mg on Wednesday  Date INR Warfarin Dose  10/20 1.4 baseline  10/25 -- 2.5 mg   10/26 1.2 4.0 mg  10/27 1.2    Goal of Therapy:  INR 2.5 to 3.5 and Heparin level 0.3 to 0.7 Monitor platelets by anticoagulation protocol: Yes   Plan:  --10/27 at 2139 HL = 0.23, subtherapeutic. Will give heparin 1000 unit IV bolus x 1 and increase heparin drip to 1450 units/hr --Heparin level 8 hours after dose change --Daily CBC per protocol  --INR subtherapeutic. Will give warfarin 4 mg x 1 tonight. (0.25 mg greater than home dose). Pt received warfarin 4 mg yesterday and we will most likely see the effects tomorrow. Daily INR ordered.   Bridge heparin with warfarin until INR is therapeutic and can d/c heparin infusion.    Plan to have INR therapeutic prior to discharge per MD.   2140  09/20/2020,10:39 PM

## 2020-09-20 NOTE — Progress Notes (Signed)
Physical Therapy Treatment Patient Details Name: [name] MRN: [id number] DOB: Feb 07, 1931 Today's Date: 09/20/2020    History of Present Illness Patient is an 86 year old male who presented to ED for weakness and low hemoglobin; admitted for management of GIB with symptomatic anemia. PMh includes aortic stenosis s/p mechanical AVR on coumadin, chronic diastolic HF, HTN, CAD, chronic venous stasis, chronic anemia, GERD, C diff, GI hemorrhage (sept 2021), anxiety, bradycardia, hearin glost, RLS    PT Comments    Pt presents in fowler's position on arrival to room and is agreeable to PT session. Pt continues to be mod I for bed mobility and CGA for sit<>stand transfer and ambulation. Pt walked 210 feet with step through pattern with decreased step height/length with heavy WBing bilat UEs on RW. Pt is slow and deliberate, but no overt buckling or LOB. Pt demonstrates SOB but took short rest breaks throughout walk. Pt walked 10 feet in 13 seconds, indicating household ambulator. Pt performed 5TSTS in recliner chair with pillows on surface. He completed it in 1 min 15 secs, indicating decreased power, strength, endurance, and increase risk for falls. Pt performed 10 breaths on IS reaching . Pt noted his ankle hurting at beginning of ambulation, however notes that it feels better at the end. Pt left in chair with all needs, door open, and nursing notified that there is no chair alarm box in his room. Pt encouraged to continue exercises throughout his day. Pt continues to benefit from skilled PT services to maximize return to PLOF and minimize risk of future falls, injury, caregiver burden, and readmission. Will continue to follow POC. Discharge recommendation remains appropriate.    Follow Up Recommendations  SNF     Equipment Recommendations  Rolling walker with 5" wheels    Recommendations for Other Services       Precautions / Restrictions Precautions Precautions:  Fall Restrictions Weight Bearing Restrictions: No    Mobility  Bed Mobility Overal bed mobility: Modified Independent Bed Mobility: Supine to Sit     Supine to sit: HOB elevated;Min guard     General bed mobility comments: increased time/effort, heavy reliance on bed rails  Transfers Overall transfer level: Needs assistance Equipment used: Rolling walker (2 wheeled);None Transfers: Sit to/from Stand Sit to Stand: From elevated surface;Min guard         General transfer comment: Patient able to push off from bed and perform sit<>stand transfer with CGA. Pt performed 5 sit<>stands from chair without any AD  Ambulation/Gait Ambulation/Gait assistance: Min guard Gait Distance (Feet): 210 Feet Assistive device: Rolling walker (2 wheeled) Gait Pattern/deviations: Step-through pattern;Decreased step length - right;Decreased step length - left Gait velocity: 10 feet / 13 seconds = 0.769 feet/secs Gait velocity interpretation: <1.31 ft/sec, indicative of household ambulator General Gait Details: Decreased step height/length with heavy WBing bilat UEs on RW; Pt is slow and deliberate, but no overt buckling or LOB. Pt demonstrates SOB but took short rest breaks throughout walk.   Stairs             Wheelchair Mobility    Modified Rankin (Stroke Patients Only)       Balance Overall balance assessment: Needs assistance Sitting-balance support: Bilateral upper extremity supported;Feet supported Sitting balance-Leahy Scale: Good Sitting balance - Comments: Pt able to maintain balance on sitting EOB   Standing balance support: During functional activity;Bilateral upper extremity supported Standing balance-Leahy Scale: Good Standing balance comment: Pt able to maintain balance with BUE supported on RW  Cognition Arousal/Alertness: Awake/alert Behavior During Therapy: WFL for tasks assessed/performed Overall Cognitive Status: Within  Functional Limits for tasks assessed                                        Exercises General Exercises - Lower Extremity Ankle Circles/Pumps: AROM;Both;10 reps;Seated Straight Leg Raises: AROM;Both;10 reps;Seated Other Exercises Other Exercises: 5TSTS performed in 1 min 15 secs, indicating decreased power, strength, endurance, increase risk for falls. Pt performed in recliner chair with pillows on surface. Pt cued to stand up straight Other Exercises: 10 breaths on IS, reached    General Comments        Pertinent Vitals/Pain Pain Assessment: Faces Faces Pain Scale: Hurts a little bit Pain Location: L ankle Pain Descriptors / Indicators: Other (Comment) (Stiff) Pain Intervention(s): Monitored during session    Home Living                      Prior Function            PT Goals (current goals can now be found in the care plan section) Acute Rehab PT Goals Patient Stated Goal: To return home  PT Goal Formulation: With patient Time For Goal Achievement: 09/30/20 Potential to Achieve Goals: Good Progress towards PT goals: Progressing toward goals    Frequency    Min 2X/week      PT Plan Current plan remains appropriate    Co-evaluation              AM-PAC PT "6 Clicks" Mobility   Outcome Measure  Help needed turning from your back to your side while in a flat bed without using bedrails?: None Help needed moving from lying on your back to sitting on the side of a flat bed without using bedrails?: A Little Help needed moving to and from a bed to a chair (including a wheelchair)?: A Little Help needed standing up from a chair using your arms (e.g., wheelchair or bedside chair)?: A Little Help needed to walk in hospital room?: A Little Help needed climbing 3-5 steps with a railing? : A Lot 6 Click Score: 18    End of Session Equipment Utilized During Treatment: Gait belt Activity Tolerance: Patient tolerated treatment  well Patient left: with call bell/phone within reach;in chair (No alarm box in room, nursing notified) Nurse Communication: Mobility status PT Visit Diagnosis: Muscle weakness (generalized) (M62.81);Difficulty in walking, not elsewhere classified (R26.2)     Time: 1638-4536 PT Time Calculation (min) (ACUTE ONLY): 27 min  Charges:                         Katherine Basset, SPT Baker Pierini 09/20/2020, 10:22 AM

## 2020-09-20 NOTE — Consult Note (Signed)
Cardiology Consultation:   Patient ID: [name] MRN: [id number]; DOB: 1931-07-30  Admit date: 09/13/2020 Date of Consult: 09/20/2020  Primary Care Provider: Gracelyn Nurse, MD [facility] Cardiologist: Julien Nordmann, MD  [facility] Electrophysiologist:  None    Patient Profile:   [name] is a 86 y.o. male with a hx of permanent atrial fibrillation, aortic stenosis status post mechanical aortic valve who is being seen today for the evaluation of mechanical aortic valve, at the request of Dr. Myriam Forehand.  History of Present Illness:   [name] aortic stenosis status post mechanical AVR 2002, permanent atrial fibrillation, hypertension, nonobstructive CAD who presents due to abnormal lab work.  Patient had blood work obtained from PCPs office showing hemoglobin of 7.4.  He was subsequently sent to the emergency room.    Labs in the ED confirmed low hemoglobin at 7.7.  Warfarin was held, patient was transfused.  He underwent EGD on 10/24 showing nonbleeding duodenal ulcers, a single localized lesion with stigmata of recent bleeding noted, coagulation using argon beam performed.  Hemoglobin has stayed stable in the 9 range.  He states doing okay, denies any blood in his stool or dark stools.  Plan for discharge/transfer to nursing facility planned today.  INR today 1.2.  Of note, INR on admit was 1.4.   Past Medical History:  Diagnosis Date  . Anxiety 10/11  . Aortic stenosis    a. s/p mechcanical AVR, 2002; b. 10/2018 Echo: Triv AI, mean grad .  . Bradycardia    chronic, no symptoms 07/2010  . C. difficile colitis   . Carotid bruit    dopplers in past, no abnormalities  . Chronic diastolic CHF (congestive heart failure) (HCC)    a. Echo 03/2015: EF 60-65%, no RWMA, GR1DD, mild BAE, mild to mod MR, mod TR, PASP 65 mmHg; b. 01/2017 Echo: EF 55-60%, NRWMA, grade 1 diastolic dysfunction.  Normal functioning prosthetic aortic valve.  Mean gradient 50  mmHg.  Sev TR. PASP ; c. 10/2018 Echo: EF 55-60%, Triv AI, mod dil LA, mod-sev TR, PASP 35-40, mild to mod red RV fxn.  . Coronary artery disease    a. mild, cath, 08/2010; b. medically managed  . Decreased hearing    Right ear  . Depression   . Gastric ulcer   . GERD (gastroesophageal reflux disease)   . Hypertension    BP higher than usual 04/19/10; amlodipine increased by telephone  . Mod-Sev Tricuspid regurgitation    a. 10/2018 Echo: Mod-Sev TR, PASP 35-15mmHg.  Marland Kitchen RLS (restless legs syndrome) 08/23/2015  . S/P AVR    a. St. Jude. mechanical 2002; b. echo 08/2010 EF 60%, trival AI, mild MR, AVR working well; c. on longterm warfarin tx  . SOB (shortness of breath) 10/11   08/2010,Episodes at 5 AM, eventually felt to be anxiety, after complete workup including catheterization, pt greatly improved with anxiety meds 11/11    Past Surgical History:  Procedure Laterality Date  . CARDIAC CATHETERIZATION    . COLONOSCOPY WITH PROPOFOL N/A 08/03/2020   Procedure: COLONOSCOPY WITH PROPOFOL;  Surgeon: Toledo, Boykin Nearing, MD;  Location: [facility] ENDOSCOPY;  Service: Gastroenterology;  Laterality: N/A;  . ENTEROSCOPY N/A 09/17/2020   Procedure: ENTEROSCOPY;  Surgeon: Toney Reil, MD;  Location: [facility] ENDOSCOPY;  Service: Gastroenterology;  Laterality: N/A;  . ESOPHAGOGASTRODUODENOSCOPY N/A 04/05/2015   Procedure: ESOPHAGOGASTRODUODENOSCOPY (EGD);  Surgeon: Scot Jun, MD;  Location: [facility] ENDOSCOPY;  Service: Endoscopy;  Laterality: N/A;  . ESOPHAGOGASTRODUODENOSCOPY  N/A 04/17/2015   Procedure: ESOPHAGOGASTRODUODENOSCOPY (EGD);  Surgeon: [name], MD;  [facility];  Service: Endoscopy;  Laterality: N/A;  . ESOPHAGOGASTRODUODENOSCOPY N/A 08/02/2015   Procedure: ESOPHAGOGASTRODUODENOSCOPY (EGD);  Surgeon: [name], MD;  [facility];  Service: Endoscopy;  Laterality: N/A;  . ESOPHAGOGASTRODUODENOSCOPY N/A 07/30/2020   Procedure: ESOPHAGOGASTRODUODENOSCOPY  (EGD);  Surgeon: Toledo, Boykin Nearingeodoro K, MD;  Location: [facility] ENDOSCOPY;  Service: Gastroenterology;  Laterality: N/A;  . GIVENS CAPSULE STUDY N/A 09/15/2020   Procedure: GIVENS CAPSULE STUDY;  Surgeon: Wyline MoodAnna, Kiran, MD;  [facility];  Service: Gastroenterology;  Laterality: N/A;  . HERNIA REPAIR    . JOINT REPLACEMENT    . TOTAL HIP ARTHROPLASTY    . VALVE REPLACEMENT  1/02   Aortic; echo 3/09 valve working well; echo 10/11 working well; put on Coumadin     Home Medications:  Prior to Admission medications   Medication Sig Start Date End Date Taking? Authorizing Provider  allopurinol (ZYLOPRIM) 300 MG tablet Take 300 mg by mouth daily.  05/16/20  Yes [provider]  Cetirizine HCl 10 MG CAPS Take 10 mg by mouth daily.   Yes [provider]  finasteride (PROSCAR) 5 MG tablet Take 5 mg by mouth daily.     Yes [provider]  metoprolol succinate (TOPROL-XL) 25 MG 24 hr tablet Take 0.5 tablets (12.5 mg total) by mouth daily. 03/06/20  Yes Gollan, Tollie Pizzaimothy J, MD  pantoprazole (PROTONIX) 40 MG tablet Take 40 mg by mouth daily.   Yes [provider]  potassium chloride (KLOR-CON) 10 MEQ tablet TAKE 3 TABLETS (30 MEQ) BY MOUTH DAILY. TAKE AN ADDITIONAL 2TABS ON THE DAYS YOU TAKE METOLAZONE 06/08/20  Yes Gollan, Tollie Pizzaimothy J, MD  rOPINIRole (REQUIP) 2 MG tablet TAKE 1 TABLET BY MOUTH 4  TIMES DAILY Patient taking differently: Take 2 mg by mouth in the morning, at noon, in the evening, and at bedtime. Take 1 tablet by mouth 4  times daily 09/14/18  Yes Millikan, Aundra MilletMegan, NP  torsemide (DEMADEX) 20 MG tablet Take 2 tablets (40 mg total) by mouth daily. Take additional 40 mg for 3 pounds weight gain . Patient taking differently: Take 40 mg by mouth 2 (two) times daily.  07/14/20  Yes Meredeth IdeLama, Gagan S, MD  traZODone (DESYREL) 50 MG tablet Take 50 mg by mouth at bedtime.   Yes [provider]  warfarin (COUMADIN) 2.5 MG tablet Take 1 tablet (2.5 mg total) by mouth  daily at 4 PM. Takes daily at 1700 Patient taking differently: Take 2.5-3.75 mg by mouth daily. Monday, Tuesday, thursday,friday,saturday, sunday 3.75mg -Wednesday 08/11/20  Yes Lurene ShadowAyiku, Bernard, MD  Acetaminophen 500 MG capsule Take 2 capsules by mouth every 8 (eight) hours as needed.    [provider]  busPIRone (BUSPAR) 5 MG tablet Take 1 tablet (5 mg total) by mouth 2 (two) times daily. Patient not taking: Reported on 09/13/2020 08/10/20   Lurene ShadowAyiku, Bernard, MD  DULoxetine (CYMBALTA) 30 MG capsule Take 1 capsule (30 mg total) by mouth daily. Patient not taking: Reported on 09/13/2020 07/15/20   Meredeth IdeLama, Gagan S, MD  metolazone (ZAROXOLYN) 2.5 MG tablet Take 1 tablet (2.5 mg total) by mouth 2 (two) times a week. 02/17/20 09/13/20  Antonieta IbaGollan, Timothy J, MD    Inpatient Medications: Scheduled Meds: . busPIRone  5 mg Oral BID  . docusate sodium  200 mg Oral BID  . DULoxetine  30 mg Oral Daily  . furosemide  40 mg Oral Daily  .  metoprolol succinate  12.5 mg Oral Daily  . pantoprazole  40 mg Oral BID AC  . polyethylene glycol  17 g Oral Daily  . rOPINIRole  1 mg Oral Daily  . rOPINIRole  2 mg Oral TID  . traZODone  50 mg Oral QHS  . warfarin  4 mg Oral ONCE-1600  . Warfarin - Pharmacist Dosing Inpatient   Does not apply q1600   Continuous Infusions:  PRN Meds: acetaminophen **OR** acetaminophen, bisacodyl, morphine injection, ondansetron **OR** ondansetron (ZOFRAN) IV  Allergies:    Allergies  Allergen Reactions  . Sulfa Antibiotics Other (See Comments)    Reaction:  Unknown   . Levofloxacin Nausea Only    Social History:   Social History   Socioeconomic History  . Marital status: Married    Spouse name: Not on file  . Number of children: 3  . Years of education: some coll.  . Highest education level: Not on file  Occupational History  . Occupation: Retired    Associate Professor: RETIRED  Tobacco Use  . Smoking status: Former Smoker    Types: Cigarettes    Quit date: 1979     Years since quitting: 42.8  . Smokeless tobacco: Never Used  Vaping Use  . Vaping Use: Never used  Substance and Sexual Activity  . Alcohol use: Yes    Alcohol/week: 3.0 standard drinks    Types: 3 Glasses of wine per week  . Drug use: No  . Sexual activity: Never  Other Topics Concern  . Not on file  Social History Narrative   No regular exercise.   Patient lives at [facility]. His wife lives in same facility but she lives in the assisted living part.      Patient drinks 1-2 cups of caffeine daily.   Patient is right handed.   Social Determinants of Health   Financial Resource Strain:   . Difficulty of Paying Living Expenses: Not on file  Food Insecurity:   . Worried About Programme researcher, broadcasting/film/video in the Last Year: Not on file  . Ran Out of Food in the Last Year: Not on file  Transportation Needs:   . Lack of Transportation (Medical): Not on file  . Lack of Transportation (Non-Medical): Not on file  Physical Activity:   . Days of Exercise per Week: Not on file  . Minutes of Exercise per Session: Not on file  Stress:   . Feeling of Stress : Not on file  Social Connections:   . Frequency of Communication with Friends and Family: Not on file  . Frequency of Social Gatherings with Friends and Family: Not on file  . Attends Religious Services: Not on file  . Active Member of Clubs or Organizations: Not on file  . Attends Banker Meetings: Not on file  . Marital Status: Not on file  Intimate Partner Violence:   . Fear of Current or Ex-Partner: Not on file  . Emotionally Abused: Not on file  . Physically Abused: Not on file  . Sexually Abused: Not on file    Family History:    Family History  Problem Relation Age of Onset  . Hypertension Mother   . Diabetes type II Mother   . Heart disease Mother   . Heart attack Mother   . Hypertension Father   . Heart disease Father   . Stroke Father   . Stroke Brother   . Stroke Brother   . Hypertension Other  ROS:  Please see the history of present illness.   All other ROS reviewed and negative.     Physical Exam/Data:   Vitals:   09/19/20 2013 09/20/20 0420 09/20/20 0753 09/20/20 1144  BP: 128/68 124/79 130/76 126/82  Pulse: 60 62 (!) 58 (!) 58  Resp: Temp: 97.7 F (36.5 C) 97.6 F (36.4 C) (!) 97.5 F (36.4 C) 98.4 F (36.9 C)  TempSrc: Oral Oral Oral   SpO2: 99% 98% 94% 93%  Weight:      Height:        Intake/Output Summary (Last 24 hours) at 09/20/2020 1209 Last data filed at 09/20/2020 1033 Gross per 24 hour  Intake 600 ml  Output 625 ml  Net -25 ml   Last 3 Weights 09/19/2020 09/18/2020 09/17/2020  Weight (lbs) 215 lb 6.4 oz 211 lb 8 oz 212 lb 15.4 oz  Weight (kg) 97.705 kg 95.936 kg 96.6 kg     Body mass index is 30.04 kg/m.  General:  Well nourished, well developed, in no acute distress HEENT: normal Lymph: no adenopathy Neck: no JVD Endocrine:  No thryomegaly Vascular: No carotid bruits; FA pulses 2+ bilaterally without bruits  Cardiac: Irregular irregular, mechanical click noted Lungs:  clear to auscultation bilaterally, no wheezing, rhonchi or rales  Abd: soft, nontender, no hepatomegaly  Ext: Trace edema Musculoskeletal:  No deformities, BUE and BLE strength normal and equal Skin: warm and dry  Neuro:  CNs 2-12 intact, no focal abnormalities noted Psych:  Normal affect   EKG:  The EKG was personally reviewed and demonstrates: Atrial fibrillation Telemetry:  Telemetry was personally reviewed and demonstrates: Atrial fibrillation  Relevant CV Studies: Echo 07/2020 1. Left ventricular ejection fraction, by estimation, is 60 to 65%. The  left ventricle has normal function. The left ventricle has no regional  wall motion abnormalities. Left ventricular diastolic parameters are  indeterminate.  2. Right ventricular systolic function is normal. The right ventricular  size is mildly enlarged. There is mildly elevated pulmonary artery   systolic pressure. The estimated right ventricular systolic pressure is  39.2 mmHg.  3. Mild mitral valve regurgitation.  4. Tricuspid valve regurgitation is moderate.  5. The aortic valve was not well visualized. Mechanical aortic valve. No  aortic stenosis is present. Aortic valve mean gradient measures 9.0 mmHg.  6. Challenging images   Laboratory Data:  High Sensitivity Troponin:   Recent Labs  Lab 09/16/20 2219 09/17/20 0018  TROPONINIHS 11 13     Chemistry Recent Labs  Lab 09/18/20 0607 09/19/20 0532 09/20/20 0629  NA 135 136 135  K 3.7 3.8 4.0  CL 100 99 101  CO2 GLUCOSE 91 88 99  BUN CREATININE 0.81 0.88 0.85  CALCIUM 8.4* 8.8* 8.8*  GFRNONAA >60 >60 >60  ANIONGAP Recent Labs  Lab 09/13/20 1240  PROT 6.1*  ALBUMIN 3.5  AST 20  ALT 10  ALKPHOS 108  BILITOT 0.8   Hematology Recent Labs  Lab 09/18/20 0653 09/19/20 0532 09/20/20 0629  WBC 5.7 4.5 4.2  RBC 3.09* 3.14* 3.23*  HGB 9.1* 9.0* 9.6*  HCT 28.4* 28.9* 30.0*  MCV 91.9 92.0 92.9  MCH 29.4 28.7 29.7  MCHC 32.0 31.1 32.0  RDW 16.8* 17.1* 17.1*  PLT 218 209 211   BNP Recent Labs  Lab 09/13/20 1240 09/16/20 2219  BNP 379.3* 469.2*    DDimer No results for  input(s): DDIMER in the last 168 hours.   Radiology/Studies:  DG Chest Port 1 View  Result Date: 09/16/2020 CLINICAL DATA:  Shortness of breath EXAM: PORTABLE CHEST 1 VIEW COMPARISON:  09/13/2020 FINDINGS: Cardiac shadow is enlarged but stable. Postsurgical changes are again seen. Vascular congestion and mild interstitial edema is noted. No focal infiltrate is seen. No bony abnormality is noted. IMPRESSION: Mild CHF. Electronically Signed   By: Alcide Clever M.D.   On: 09/16/2020 22:52     Assessment and Plan:   1. Mechanical aortic valve -Clinical dilemma involving GI bleed.  Status post upper GI EGD with cautery performed. -H&H stable -Goal INR 3 (afib +mech valve) -Restart Coumadin with  bridging with heparin as per [facility] AHA guidelines for mechanical valve -If patient is to be discharged on subtherapeutic INR, recommend Lovenox bridge.  INR should be monitored and Coumadin dosed accordingly at facility for therapeutic INR. -Obviously monitor patient closely for bleeding -Protonix twice daily  2.  Permanent atrial fibrillation -Rate control -Toprol-XL, anticoagulation as above  3. HFpEF -lasix po  4. Gi bleed, anemia -h/h stable after transfusion, cauterization -protonix 40 bid -GI input appreciated.  Please let us know if additional input is needed.  Cardiology will sign off at this time.  Thank you for this consult.  Signed, Debbe Odea, MD  09/20/2020 12:09 PM

## 2020-09-20 NOTE — TOC Progression Note (Addendum)
Transition of Care [facility]) - Progression Note    Patient Details  Name: [name] MRN: [id number] Date of Birth: [birth year]  [facility]) CM/SW Contact  Maree Krabbe, LCSW Phone Number: 09/20/2020, 10:23 AM  Clinical Narrative:   Per [name] at [facility] they will have a bed for pt today. Berkley Harvey has been started through [employer] portal.  1:06 Pt not medically ready for d/c today. [name] at [facility] updated.  3:02 [name] daughter called asking to speak to the MD stating pt's daughter was told yesterday pt's INR was not therapeutic and pt would d/c today to SNF on Lovenox. However, today pt has a new attending and pt's daughter trying to understand medication and treatment change. MD provided message.  Expected Discharge Plan: Skilled Nursing Facility Barriers to Discharge: Continued Medical Work up  Expected Discharge Plan and Services Expected Discharge Plan: Skilled Nursing Facility In-house Referral: NA   Post Acute Care Choice: Skilled Nursing Facility Living arrangements for the past 2 months: Assisted Living Facility                                       Social Determinants of Health (SDOH) Interventions    Readmission Risk Interventions Readmission Risk Prevention Plan 09/18/2020 08/10/2020 08/04/2020  Transportation Screening Complete Complete Complete  PCP or Specialist Appt within 3-5 Days Complete Complete -  HRI or Home Care Consult Complete Complete (No Data)  Social Work Consult for Recovery Care Planning/Counseling Complete - Complete  Palliative Care Screening Complete Not Applicable Not Applicable  Medication Review Oceanographer) Complete Complete Complete  Some recent data might be hidden

## 2020-09-20 NOTE — Consult Note (Signed)
ANTICOAGULATION CONSULT NOTE  Pharmacy Consult for Warfarin Indication: mAVR w/ Hx of Afib  Allergies  Allergen Reactions  . Sulfa Antibiotics Other (See Comments)    Reaction:  Unknown   . Levofloxacin Nausea Only    Patient Measurements: Height: 5\' 11"  (180.3 cm) Weight: 97.7 kg (215 lb 6.4 oz) IBW/kg (Calculated) : 75.3  Vital Signs: Temp: 97.5 F (36.4 C) (10/27 0753) Temp Source: Oral (10/27 0753) BP: 130/76 (10/27 0753) Pulse Rate: 58 (10/27 0753)  Labs: Recent Labs    09/18/20 0607 09/18/20 09/20/20 09/18/20 0653 09/19/20 0532 09/20/20 0629  HGB  --  9.1*   < > 9.0* 9.6*  HCT  --  28.4*  --  28.9* 30.0*  PLT  --  218  --  209 211  LABPROT  --   --   --  14.7 15.0  INR  --   --   --  1.2 1.2  CREATININE 0.81  --   --  0.88 0.85   < > = values in this interval not displayed.    Estimated Creatinine Clearance: 73 mL/min (by C-G formula based on SCr of 0.85 mg/dL).   Medical History: Past Medical History:  Diagnosis Date  . Anxiety 10/11  . Aortic stenosis    a. s/p mechcanical AVR, 2002; b. 10/2018 Echo: Triv AI, mean grad 11/2018.  . Bradycardia    chronic, no symptoms 07/2010  . C. difficile colitis   . Carotid bruit    dopplers in past, no abnormalities  . Chronic diastolic CHF (congestive heart failure) (HCC)    a. Echo 03/2015: EF 60-65%, no RWMA, GR1DD, mild BAE, mild to mod MR, mod TR, PASP 65 mmHg; b. 01/2017 Echo: EF 55-60%, NRWMA, grade 1 diastolic dysfunction.  Normal functioning prosthetic aortic valve.  Mean gradient 50 mmHg.  Sev TR. PASP 02/2017; c. 10/2018 Echo: EF 55-60%, Triv AI, mod dil LA, mod-sev TR, PASP 35-40, mild to mod red RV fxn.  . Coronary artery disease    a. mild, cath, 08/2010; b. medically managed  . Decreased hearing    Right ear  . Depression   . Gastric ulcer   . GERD (gastroesophageal reflux disease)   . Hypertension    BP higher than usual 04/19/10; amlodipine increased by telephone  . Mod-Sev Tricuspid regurgitation     a. 10/2018 Echo: Mod-Sev TR, PASP 35-64mmHg.  43m RLS (restless legs syndrome) 08/23/2015  . S/P AVR    a. St. Jude. mechanical 2002; b. echo 08/2010 EF 60%, trival AI, mild MR, AVR working well; c. on longterm warfarin tx  . SOB (shortness of breath) 10/11   08/2010,Episodes at 5 AM, eventually felt to be anxiety, after complete workup including catheterization, pt greatly improved with anxiety meds 11/11    Medications:  Medications Prior to Admission  Medication Sig Dispense Refill Last Dose  . allopurinol (ZYLOPRIM) 300 MG tablet Take 300 mg by mouth daily.    09/13/2020 at 0900  . Cetirizine HCl 10 MG CAPS Take 10 mg by mouth daily.   09/13/2020 at 0900  . finasteride (PROSCAR) 5 MG tablet Take 5 mg by mouth daily.     09/13/2020 at 0900  . metoprolol succinate (TOPROL-XL) 25 MG 24 hr tablet Take 0.5 tablets (12.5 mg total) by mouth daily. 90 tablet 3 09/13/2020 at 0900  . pantoprazole (PROTONIX) 40 MG tablet Take 40 mg by mouth daily.   09/13/2020 at 0900  . potassium chloride (KLOR-CON) 10 MEQ tablet TAKE  3 TABLETS (30 MEQ) BY MOUTH DAILY. TAKE AN ADDITIONAL 2TABS ON THE DAYS YOU TAKE METOLAZONE 294 tablet 0 09/13/2020 at 0900  . rOPINIRole (REQUIP) 2 MG tablet TAKE 1 TABLET BY MOUTH 4  TIMES DAILY (Patient taking differently: Take 2 mg by mouth in the morning, at noon, in the evening, and at bedtime. Take 1 tablet by mouth 4  times daily) 360 tablet 3 09/13/2020 at 0900  . torsemide (DEMADEX) 20 MG tablet Take 2 tablets (40 mg total) by mouth daily. Take additional 40 mg for 3 pounds weight gain . (Patient taking differently: Take 40 mg by mouth 2 (two) times daily. ) 60 tablet 2 09/13/2020 at 0900  . traZODone (DESYREL) 50 MG tablet Take 50 mg by mouth at bedtime.   09/12/2020 at 1600  . warfarin (COUMADIN) 2.5 MG tablet Take 1 tablet (2.5 mg total) by mouth daily at 4 PM. Takes daily at 1700 (Patient taking differently: Take 2.5-3.75 mg by mouth daily. Monday, Tuesday,  thursday,friday,saturday, sunday 3.75mg -Wednesday)   09/12/2020 at 1600  . Acetaminophen 500 MG capsule Take 2 capsules by mouth every 8 (eight) hours as needed.   PRN at PRN  . busPIRone (BUSPAR) 5 MG tablet Take 1 tablet (5 mg total) by mouth 2 (two) times daily. (Patient not taking: Reported on 09/13/2020)   Not Taking at Unknown time  . DULoxetine (CYMBALTA) 30 MG capsule Take 1 capsule (30 mg total) by mouth daily. (Patient not taking: Reported on 09/13/2020) 30 capsule 3 Not Taking at Unknown time  . metolazone (ZAROXOLYN) 2.5 MG tablet Take 1 tablet (2.5 mg total) by mouth 2 (two) times a week. 72 tablet 3 09/11/2020   Scheduled:  . busPIRone  5 mg Oral BID  . docusate sodium  200 mg Oral BID  . DULoxetine  30 mg Oral Daily  . enoxaparin (LOVENOX) injection  40 mg Subcutaneous Q24H  . furosemide  40 mg Oral Daily  . metoprolol succinate  12.5 mg Oral Daily  . pantoprazole  40 mg Oral BID AC  . polyethylene glycol  17 g Oral Daily  . rOPINIRole  1 mg Oral Daily  . rOPINIRole  2 mg Oral TID  . traZODone  50 mg Oral QHS  . warfarin  4 mg Oral ONCE-1600  . Warfarin - Pharmacist Dosing Inpatient   Does not apply q1600   Infusions:   PRN: acetaminophen **OR** acetaminophen, bisacodyl, morphine injection, ondansetron **OR** ondansetron (ZOFRAN) IV Anti-infectives (From admission, onward)   None      Assessment: Pharmacy consulted to restart patient's warfarin. Anticoagulation was on hold due to GI bleed. Cleared by GI per attending. No major DDI. Hx of bleeding with a Hgb of 4.1 back in September.   Home regimen: warfarin 2.5 mg MTuThFSaSu and 3.75 mg on Wednesday  Date INR Warfarin Dose  10/20 1.4 baseline  10/25 -- 2.5 mg   10/26 1.2 4.0 mg  10/27 1.2     Goal of Therapy:  INR 2.5 - 3.5  Monitor platelets by anticoagulation protocol: Yes   Plan:  Will repeat warfarin 4 mg x 1 tonight and continue enoxaparin 40 mg daily for DVT ppx while INR gets therapeutic. Daily INR  ordered. CBC at least every 3 days.   Discharge recommendations:  Recommend sending patient home on home regimen of warfarin 2.5 mg MTuThFSaSu and 3.75 mg on Wednesday, with enoxaparin bridge until IR reaches therapeutic goal (2.5 -3.5).    Terrial Rhodes, PharmD, BCPS 09/20/2020,8:34 AM

## 2020-09-21 ENCOUNTER — Inpatient Hospital Stay: Payer: Medicare Other

## 2020-09-21 DIAGNOSIS — Z952 Presence of prosthetic heart valve: Secondary | ICD-10-CM | POA: Diagnosis not present

## 2020-09-21 DIAGNOSIS — J9601 Acute respiratory failure with hypoxia: Secondary | ICD-10-CM | POA: Diagnosis not present

## 2020-09-21 DIAGNOSIS — D62 Acute posthemorrhagic anemia: Secondary | ICD-10-CM | POA: Diagnosis not present

## 2020-09-21 DIAGNOSIS — R509 Fever, unspecified: Secondary | ICD-10-CM

## 2020-09-21 DIAGNOSIS — J9621 Acute and chronic respiratory failure with hypoxia: Secondary | ICD-10-CM | POA: Diagnosis not present

## 2020-09-21 DIAGNOSIS — K922 Gastrointestinal hemorrhage, unspecified: Secondary | ICD-10-CM | POA: Diagnosis not present

## 2020-09-21 DIAGNOSIS — I5032 Chronic diastolic (congestive) heart failure: Secondary | ICD-10-CM | POA: Diagnosis not present

## 2020-09-21 LAB — CBC WITH DIFFERENTIAL/PLATELET
Abs Immature Granulocytes: 0.07 10*3/uL (ref 0.00–0.07)
Basophils Absolute: 0.1 10*3/uL (ref 0.0–0.1)
Basophils Relative: 0 %
Eosinophils Absolute: 0 10*3/uL (ref 0.0–0.5)
Eosinophils Relative: 0 %
HCT: 31.8 % — ABNORMAL LOW (ref 39.0–52.0)
Hemoglobin: 10.3 g/dL — ABNORMAL LOW (ref 13.0–17.0)
Immature Granulocytes: 0 %
Lymphocytes Relative: 3 %
Lymphs Abs: 0.5 10*3/uL — ABNORMAL LOW (ref 0.7–4.0)
MCH: 29.2 pg (ref 26.0–34.0)
MCHC: 32.4 g/dL (ref 30.0–36.0)
MCV: 90.1 fL (ref 80.0–100.0)
Monocytes Absolute: 0.7 10*3/uL (ref 0.1–1.0)
Monocytes Relative: 4 %
Neutro Abs: 15.1 10*3/uL — ABNORMAL HIGH (ref 1.7–7.7)
Neutrophils Relative %: 93 %
Platelets: 259 10*3/uL (ref 150–400)
RBC: 3.53 MIL/uL — ABNORMAL LOW (ref 4.22–5.81)
RDW: 16.9 % — ABNORMAL HIGH (ref 11.5–15.5)
WBC: 16.5 10*3/uL — ABNORMAL HIGH (ref 4.0–10.5)
nRBC: 0 % (ref 0.0–0.2)

## 2020-09-21 LAB — BLOOD GAS, ARTERIAL
Acid-Base Excess: 1.4 mmol/L (ref 0.0–2.0)
Bicarbonate: 24.6 mmol/L (ref 20.0–28.0)
FIO2: 1
O2 Saturation: 100 %
Patient temperature: 37
pCO2 arterial: 33 mmHg (ref 32.0–48.0)
pH, Arterial: 7.48 — ABNORMAL HIGH (ref 7.350–7.450)
pO2, Arterial: 374 mmHg — ABNORMAL HIGH (ref 83.0–108.0)

## 2020-09-21 LAB — BASIC METABOLIC PANEL
Anion gap: 10 (ref 5–15)
BUN: 23 mg/dL (ref 8–23)
CO2: 24 mmol/L (ref 22–32)
Calcium: 8.4 mg/dL — ABNORMAL LOW (ref 8.9–10.3)
Chloride: 100 mmol/L (ref 98–111)
Creatinine, Ser: 1.14 mg/dL (ref 0.61–1.24)
GFR, Estimated: >60 mL/min (ref >60)
Glucose, Bld: 108 mg/dL — ABNORMAL HIGH (ref 70–99)
Potassium: 4.2 mmol/L (ref 3.5–5.1)
Sodium: 134 mmol/L — ABNORMAL LOW (ref 135–145)

## 2020-09-21 LAB — CBC
HCT: 30.5 % — ABNORMAL LOW (ref 39.0–52.0)
Hemoglobin: 9.7 g/dL — ABNORMAL LOW (ref 13.0–17.0)
MCH: 29.1 pg (ref 26.0–34.0)
MCHC: 31.8 g/dL (ref 30.0–36.0)
MCV: 91.6 fL (ref 80.0–100.0)
Platelets: 220 10*3/uL (ref 150–400)
RBC: 3.33 MIL/uL — ABNORMAL LOW (ref 4.22–5.81)
RDW: 17 % — ABNORMAL HIGH (ref 11.5–15.5)
WBC: 5.6 10*3/uL (ref 4.0–10.5)
nRBC: 0 % (ref 0.0–0.2)

## 2020-09-21 LAB — HEPARIN LEVEL (UNFRACTIONATED)
Heparin Unfractionated: 0.34 IU/mL (ref 0.30–0.70)
Heparin Unfractionated: 0.3 IU/mL (ref 0.30–0.70)

## 2020-09-21 LAB — MRSA PCR SCREENING: MRSA by PCR: NEGATIVE

## 2020-09-21 LAB — BRAIN NATRIURETIC PEPTIDE: B Natriuretic Peptide: 620.2 pg/mL — ABNORMAL HIGH (ref 0.0–100.0)

## 2020-09-21 LAB — PROTIME-INR
INR: 1.4 — ABNORMAL HIGH (ref 0.8–1.2)
Prothrombin Time: 16.3 seconds — ABNORMAL HIGH (ref 11.4–15.2)

## 2020-09-21 LAB — PROCALCITONIN: Procalcitonin: 0.38 ng/mL

## 2020-09-21 LAB — GLUCOSE, CAPILLARY: Glucose-Capillary: 101 mg/dL — ABNORMAL HIGH (ref 70–99)

## 2020-09-21 MED ORDER — METOPROLOL TARTRATE 5 MG/5ML IV SOLN
5.0000 mg | Freq: Once | INTRAVENOUS | Status: AC
  Administered 2020-09-21: 5 mg via INTRAVENOUS

## 2020-09-21 MED ORDER — SODIUM CHLORIDE 0.9 % IV SOLN: 250.0000 mL | INTRAVENOUS | Status: DC

## 2020-09-21 MED ORDER — VANCOMYCIN HCL 1250 MG/250ML IV SOLN
1250.0000 mg | Freq: Two times a day (BID) | INTRAVENOUS | Status: DC
  Administered 2020-09-22: 1250 mg via INTRAVENOUS
  Filled 2020-09-21 (×3): qty 250

## 2020-09-21 MED ORDER — ACETAMINOPHEN 10 MG/ML IV SOLN
1000.0000 mg | Freq: Once | INTRAVENOUS | Status: AC
  Administered 2020-09-21: 1000 mg via INTRAVENOUS
  Filled 2020-09-21: qty 100

## 2020-09-21 MED ORDER — HYDROMORPHONE HCL 1 MG/ML IJ SOLN
INTRAMUSCULAR | Status: AC
  Filled 2020-09-21: qty 1

## 2020-09-21 MED ORDER — FUROSEMIDE 10 MG/ML IJ SOLN
40.0000 mg | Freq: Two times a day (BID) | INTRAMUSCULAR | Status: DC
  Administered 2020-09-21: 40 mg via INTRAVENOUS
  Filled 2020-09-21 (×2): qty 4

## 2020-09-21 MED ORDER — HYDROMORPHONE HCL 1 MG/ML IJ SOLN: 1.0000 mg | Freq: Once | INTRAMUSCULAR | Status: AC

## 2020-09-21 MED ORDER — TORSEMIDE 20 MG PO TABS: 40.0000 mg | ORAL_TABLET | Freq: Two times a day (BID) | ORAL | Status: DC

## 2020-09-21 MED ORDER — CHLORHEXIDINE GLUCONATE CLOTH 2 % EX PADS
6.0000 | MEDICATED_PAD | Freq: Every day | CUTANEOUS | Status: DC
  Administered 2020-09-21 – 2020-09-25 (×4): 6 via TOPICAL

## 2020-09-21 MED ORDER — SODIUM CHLORIDE 0.9 % IV SOLN
1.0000 g | Freq: Three times a day (TID) | INTRAVENOUS | Status: DC
  Administered 2020-09-21 – 2020-09-22 (×2): 1 g via INTRAVENOUS
  Filled 2020-09-21 (×4): qty 1

## 2020-09-21 MED ORDER — ALLOPURINOL 300 MG PO TABS
300.0000 mg | ORAL_TABLET | Freq: Every day | ORAL | Status: DC
  Administered 2020-09-21 – 2020-09-26 (×6): 300 mg via ORAL
  Filled 2020-09-21 (×7): qty 1

## 2020-09-21 MED ORDER — ALPRAZOLAM 0.25 MG PO TABS
0.2500 mg | ORAL_TABLET | Freq: Every day | ORAL | Status: DC | PRN
  Administered 2020-09-21 – 2020-09-25 (×4): 0.25 mg via ORAL
  Filled 2020-09-21 (×5): qty 1

## 2020-09-21 MED ORDER — WARFARIN SODIUM 5 MG PO TABS
5.0000 mg | ORAL_TABLET | Freq: Once | ORAL | Status: AC
  Administered 2020-09-21: 5 mg via ORAL
  Filled 2020-09-21: qty 1

## 2020-09-21 MED ORDER — LORAZEPAM 2 MG/ML IJ SOLN
0.5000 mg | Freq: Once | INTRAMUSCULAR | Status: AC
  Administered 2020-09-21: 0.5 mg via INTRAVENOUS
  Filled 2020-09-21: qty 1

## 2020-09-21 MED ORDER — VANCOMYCIN HCL 2000 MG/400ML IV SOLN
2000.0000 mg | Freq: Once | INTRAVENOUS | Status: AC
  Administered 2020-09-21: 2000 mg via INTRAVENOUS
  Filled 2020-09-21: qty 400

## 2020-09-21 MED ORDER — CHLORHEXIDINE GLUCONATE 0.12 % MT SOLN
15.0000 mL | Freq: Two times a day (BID) | OROMUCOSAL | Status: DC
  Administered 2020-09-22 – 2020-09-26 (×10): 15 mL via OROMUCOSAL
  Filled 2020-09-21 (×8): qty 15

## 2020-09-21 MED ORDER — HYDROMORPHONE HCL 1 MG/ML IJ SOLN
1.0000 mg | Freq: Once | INTRAMUSCULAR | Status: AC
  Administered 2020-09-21: 1 mg via INTRAVENOUS

## 2020-09-21 MED ORDER — ORAL CARE MOUTH RINSE
15.0000 mL | Freq: Two times a day (BID) | OROMUCOSAL | Status: DC
  Administered 2020-09-22 – 2020-09-26 (×5): 15 mL via OROMUCOSAL

## 2020-09-21 MED ORDER — FINASTERIDE 5 MG PO TABS
5.0000 mg | ORAL_TABLET | Freq: Every day | ORAL | Status: DC
  Administered 2020-09-21 – 2020-09-26 (×6): 5 mg via ORAL
  Filled 2020-09-21 (×6): qty 1

## 2020-09-21 MED ORDER — NOREPINEPHRINE 4 MG/250ML-% IV SOLN
2.0000 ug/min | INTRAVENOUS | Status: DC
  Administered 2020-09-21: 3 ug/min via INTRAVENOUS
  Filled 2020-09-21 (×2): qty 250

## 2020-09-21 MED ORDER — METOPROLOL TARTRATE 5 MG/5ML IV SOLN
INTRAVENOUS | Status: AC
  Filled 2020-09-21: qty 5

## 2020-09-21 MED ORDER — FUROSEMIDE 10 MG/ML IJ SOLN
40.0000 mg | Freq: Once | INTRAMUSCULAR | Status: AC
  Administered 2020-09-21: 40 mg via INTRAVENOUS
  Filled 2020-09-21: qty 4

## 2020-09-21 NOTE — Plan of Care (Signed)

## 2020-09-21 NOTE — Progress Notes (Signed)
Occupational Therapy Treatment Patient Details Name: [name] MRN: [id number] DOB: [birth year] Today's Date: 09/21/2020    History of present illness Patient is an 86 year old male who presented to ED for weakness and low hemoglobin; admitted for management of GIB with symptomatic anemia. PMh includes aortic stenosis s/p mechanical AVR on coumadin, chronic diastolic HF, HTN, CAD, chronic venous stasis, chronic anemia, GERD, C diff, GI hemorrhage (sept 2021), anxiety, bradycardia, hearin glost, RLS   OT comments  Pt seen for OT tx this date. Pt up in recliner stating he's "not doing too well today." When asked why, pt states his anxiety is very high. STS transfer with RW to attempt to improve comfort in recliner but ultimately, pt wishing to return to bed. Min A for STS transfer and VC for RW mgt and hand/foot placement. Pt reports feeling anxious and unsteady. Encouragement provided. Once returned to bed, pt reported need to use bathroom. Due to pt's anxiety, appearing SOB, and fatigue after mobility, bed pan utilized for pt safety. RN and nurse tech notified of both anxiety and bed pan use. Pt continues to benefit from skilled OT services. Appears more anxious today and functionally not as independent at previous session. Will continue to progress towards goals. Continues to be SNF appropriate.    Follow Up Recommendations  SNF    Equipment Recommendations  3 in 1 bedside commode    Recommendations for Other Services      Precautions / Restrictions Precautions Precautions: Fall Precaution Comments: watch O2 sats Restrictions Weight Bearing Restrictions: No       Mobility Bed Mobility Overal bed mobility: Needs Assistance Bed Mobility: Sit to Supine       Sit to supine: Min guard      Transfers Overall transfer level: Needs assistance Equipment used: Rolling walker (2 wheeled) Transfers: Sit to/from Stand Sit to Stand: Min assist         General transfer  comment: cues for hand placement/foot placement, cues for PLB    Balance Overall balance assessment: Needs assistance Sitting-balance support: Bilateral upper extremity supported;Feet supported Sitting balance-Leahy Scale: Fair     Standing balance support: During functional activity;Bilateral upper extremity supported Standing balance-Leahy Scale: Fair                             ADL either performed or assessed with clinical judgement   ADL                                               Vision       Perception     Praxis      Cognition Arousal/Alertness: Awake/alert Behavior During Therapy: Anxious Overall Cognitive Status: Within Functional Limits for tasks assessed                                          Exercises Other Exercises Other Exercises: ADL mobility, recliner>EOB, sit>sup, rolling - VC for PLB 2/2 anxiety   Shoulder Instructions       General Comments      Pertinent Vitals/ Pain       Pain Assessment: No/denies pain  Home Living  Prior Functioning/Environment              Frequency  Min 1X/week        Progress Toward Goals  OT Goals(current goals can now be found in the care plan section)  Progress towards OT goals: OT to reassess next treatment  Acute Rehab OT Goals Patient Stated Goal: To return home  OT Goal Formulation: With patient Time For Goal Achievement: 09/29/20 Potential to Achieve Goals: Good  Plan Discharge plan remains appropriate;Frequency remains appropriate    Co-evaluation                 AM-PAC OT "6 Clicks" Daily Activity     Outcome Measure   Help from another person eating meals?: None Help from another person taking care of personal grooming?: A Little Help from another person toileting, which includes using toliet, bedpan, or urinal?: A Little Help from another person bathing (including  washing, rinsing, drying)?: A Little Help from another person to put on and taking off regular upper body clothing?: None Help from another person to put on and taking off regular lower body clothing?: A Little 6 Click Score: 20    End of Session Equipment Utilized During Treatment: Gait belt;Rolling walker;Oxygen  OT Visit Diagnosis: Other abnormalities of gait and mobility (R26.89);Muscle weakness (generalized) (M62.81)   Activity Tolerance Patient tolerated treatment well;Other (comment) (anxious)   Patient Left in bed;with call bell/phone within reach;with bed alarm set   Nurse Communication Other (comment) (NT/RN, anxious, on bed pan)        Time: 9242-6834 OT Time Calculation (min): 29 min  Charges: OT General Charges $OT Visit: 1 Visit OT Treatments $Self Care/Home Management : 23-37 mins  Richrd Prime, MPH, MS, OTR/L ascom [phone] 09/21/20, 4:39 PM

## 2020-09-21 NOTE — Progress Notes (Signed)
   09/21/20 1651  Assess: MEWS Score  Temp 97.7 F (36.5 C)  BP (!) 184/121  Pulse Rate (!) 136  Resp (!) 24  SpO2 (!) 78 %  Assess: MEWS Score  MEWS Temp 0  MEWS Systolic 0  MEWS Pulse 3  MEWS RR 1  MEWS LOC 0  MEWS Score 4  MEWS Score Color Red  Assess: if the MEWS score is Yellow or Red  Were vital signs taken at a resting state? Yes (pt restless in bed)  Focused Assessment Change from prior assessment (see assessment flowsheet)  Early Detection of Sepsis Score *See Row Information* Low  MEWS guidelines implemented *See Row Information* Yes  Treat  MEWS Interventions Escalated (See documentation below);Administered prn meds/treatments;Consulted Respiratory Therapy;Other (Comment)  Patients response to intervention Increased  Take Vital Signs  Increase Vital Sign Frequency  Red: Q 1hr X 4 then Q 4hr X 4, if remains red, continue Q 4hrs  Escalate  MEWS: Escalate Red: discuss with charge nurse/RN and provider, consider discussing with RRT  Notify: Charge Nurse/RN  Name of Charge Nurse/RN Notified Larose Hires, RN  Date Charge Nurse/RN Notified 09/21/20  Time Charge Nurse/RN Notified 1652  Notify: Provider  Provider Name/Title Lurene Shadow  Date Provider Notified 09/21/20  Time Provider Notified 1700  Notification Type Page  Notification Reason Change in status  Response See new orders;Other (Comment) (pt transfer)  Date of Provider Response 09/21/20  Time of Provider Response 1700  Notify: Rapid Response  Name of Rapid Response RN Notified Charli, RN  Date Rapid Response Notified 09/21/20  Time Rapid Response Notified 1700  Document  Patient Outcome Transferred/level of care increased  Progress note created (see row info) Yes    Found pt on side of bed, restless, dyspneic, vomit on bed.  Vitals taken as shown, Non re breather placed, respiratory called, rapid called.  Pt placed on BiPap, sent to ICU.  Ulis Rias, RN 09/21/2020 6:49 PM

## 2020-09-21 NOTE — H&P (Signed)
[name] is an 86 y.o. male.   Chief Complaint: altered mental status HPI: 86yo WM with history of AF, diastolic failure and aortic stenosis /AVR on coumadin admitted sept with GIB now presents to the ICU for altered mental status and dyspnea. A rapid response was called and the patient had an ABG that was without deep hypoxia on external oxygen nor hypercapnea. A CXR revealed increased pulmonary edema. On arrival to the ICU the patient was rigoring and indeed had a temp in excess of 101.32F. He was resumed on coumadin after stability from the GIB, but his INR today remained 1.4. Heparin was bridging. He apparently does not have access to alcohol at his home and is outside the window for DT's. There was no leukocytosis this morning. He can provide no clear history at the moment  Past Medical History:  Diagnosis Date  . Anxiety 10/11  . Aortic stenosis    a. s/p mechcanical AVR, 2002; b. 10/2018 Echo: Triv AI, mean grad .  . Bradycardia    chronic, no symptoms 07/2010  . C. difficile colitis   . Carotid bruit    dopplers in past, no abnormalities  . Chronic diastolic CHF (congestive heart failure) (HCC)    a. Echo 03/2015: EF 60-65%, no RWMA, GR1DD, mild BAE, mild to mod MR, mod TR, PASP 65 mmHg; b. 01/2017 Echo: EF 55-60%, NRWMA, grade 1 diastolic dysfunction.  Normal functioning prosthetic aortic valve.  Mean gradient 50 mmHg.  Sev TR. PASP ; c. 10/2018 Echo: EF 55-60%, Triv AI, mod dil LA, mod-sev TR, PASP 35-40, mild to mod red RV fxn.  . Coronary artery disease    a. mild, cath, 08/2010; b. medically managed  . Decreased hearing    Right ear  . Depression   . Gastric ulcer   . GERD (gastroesophageal reflux disease)   . Hypertension    BP higher than usual 04/19/10; amlodipine increased by telephone  . Mod-Sev Tricuspid regurgitation    a. 10/2018 Echo: Mod-Sev TR, PASP 35-33mmHg.  Marland Kitchen RLS (restless legs syndrome) 08/23/2015  . S/P AVR    a. [employer]. mechanical 2002;  b. echo 08/2010 EF 60%, trival AI, mild MR, AVR working well; c. on longterm warfarin tx  . SOB (shortness of breath) 10/11   08/2010,Episodes at 5 AM, eventually felt to be anxiety, after complete workup including catheterization, pt greatly improved with anxiety meds 11/11    Past Surgical History:  Procedure Laterality Date  . CARDIAC CATHETERIZATION    . COLONOSCOPY WITH PROPOFOL N/A 08/03/2020   Procedure: COLONOSCOPY WITH PROPOFOL;  Surgeon: Toledo, Boykin Nearing, MD;  Location: [facility] ENDOSCOPY;  Service: Gastroenterology;  Laterality: N/A;  . ENTEROSCOPY N/A 09/17/2020   Procedure: ENTEROSCOPY;  Surgeon: Toney Reil, MD;  Location: [facility] ENDOSCOPY;  Service: Gastroenterology;  Laterality: N/A;  . ESOPHAGOGASTRODUODENOSCOPY N/A 04/05/2015   Procedure: ESOPHAGOGASTRODUODENOSCOPY (EGD);  Surgeon: Scot Jun, MD;  Location: [facility] ENDOSCOPY;  Service: Endoscopy;  Laterality: N/A;  . ESOPHAGOGASTRODUODENOSCOPY N/A 04/17/2015   Procedure: ESOPHAGOGASTRODUODENOSCOPY (EGD);  Surgeon: Scot Jun, MD;  Location: [facility] ENDOSCOPY;  Service: Endoscopy;  Laterality: N/A;  . ESOPHAGOGASTRODUODENOSCOPY N/A 08/02/2015   Procedure: ESOPHAGOGASTRODUODENOSCOPY (EGD);  Surgeon: Wallace Cullens, MD;  Location: [facility] ENDOSCOPY;  Service: Endoscopy;  Laterality: N/A;  . ESOPHAGOGASTRODUODENOSCOPY N/A 07/30/2020   Procedure: ESOPHAGOGASTRODUODENOSCOPY (EGD);  Surgeon: Toledo, Boykin Nearing, MD;  Location: [facility] ENDOSCOPY;  Service: Gastroenterology;  Laterality: N/A;  . GIVENS CAPSULE STUDY N/A 09/15/2020   Procedure: GIVENS  CAPSULE STUDY;  Surgeon: Wyline Mood, MD;  Location: [facility] ENDOSCOPY;  Service: Gastroenterology;  Laterality: N/A;  . HERNIA REPAIR    . JOINT REPLACEMENT    . TOTAL HIP ARTHROPLASTY    . VALVE REPLACEMENT  1/02   Aortic; echo 3/09 valve working well; echo 10/11 working well; put on Coumadin    Family History  Problem Relation Age of Onset  . Hypertension Mother   . Diabetes type II Mother    . Heart disease Mother   . Heart attack Mother   . Hypertension Father   . Heart disease Father   . Stroke Father   . Stroke Brother   . Stroke Brother   . Hypertension Other    Social History:  reports that he quit smoking about 42 years ago. His smoking use included cigarettes. He has never used smokeless tobacco. He reports current alcohol use of about 3.0 standard drinks of alcohol per week. He reports that he does not use drugs.  Allergies:  Allergies  Allergen Reactions  . Sulfa Antibiotics Other (See Comments)    Reaction:  Unknown   . Levofloxacin Nausea Only    Medications Prior to Admission  Medication Sig Dispense Refill  . allopurinol (ZYLOPRIM) 300 MG tablet Take 300 mg by mouth daily.     . Cetirizine HCl 10 MG CAPS Take 10 mg by mouth daily.    . finasteride (PROSCAR) 5 MG tablet Take 5 mg by mouth daily.      . metoprolol succinate (TOPROL-XL) 25 MG 24 hr tablet Take 0.5 tablets (12.5 mg total) by mouth daily. 90 tablet 3  . pantoprazole (PROTONIX) 40 MG tablet Take 40 mg by mouth daily.    . potassium chloride (KLOR-CON) 10 MEQ tablet TAKE 3 TABLETS (30 MEQ) BY MOUTH DAILY. TAKE AN ADDITIONAL 2TABS ON THE DAYS YOU TAKE METOLAZONE 294 tablet 0  . rOPINIRole (REQUIP) 2 MG tablet TAKE 1 TABLET BY MOUTH 4  TIMES DAILY (Patient taking differently: Take 2 mg by mouth in the morning, at noon, in the evening, and at bedtime. Take 1 tablet by mouth 4  times daily) 360 tablet 3  . torsemide (DEMADEX) 20 MG tablet Take 2 tablets (40 mg total) by mouth daily. Take additional 40 mg for 3 pounds weight gain . (Patient taking differently: Take 40 mg by mouth 2 (two) times daily. ) 60 tablet 2  . traZODone (DESYREL) 50 MG tablet Take 50 mg by mouth at bedtime.    Marland Kitchen warfarin (COUMADIN) 2.5 MG tablet Take 1 tablet (2.5 mg total) by mouth daily at 4 PM. Takes daily at 1700 (Patient taking differently: Take 2.5-3.75 mg by mouth daily. Monday, Tuesday, thursday,friday,saturday,  sunday 3.75mg -Wednesday)    . Acetaminophen 500 MG capsule Take 2 capsules by mouth every 8 (eight) hours as needed.    . busPIRone (BUSPAR) 5 MG tablet Take 1 tablet (5 mg total) by mouth 2 (two) times daily. (Patient not taking: Reported on 09/13/2020)    . DULoxetine (CYMBALTA) 30 MG capsule Take 1 capsule (30 mg total) by mouth daily. (Patient not taking: Reported on 09/13/2020) 30 capsule 3  . metolazone (ZAROXOLYN) 2.5 MG tablet Take 1 tablet (2.5 mg total) by mouth 2 (two) times a week. 72 tablet 3    Results for orders placed or performed during the hospital encounter of 09/13/20 (from the past 48 hour(s))  Protime-INR     Status: None   Collection Time: 09/20/20  6:29 AM  Result  Value Ref Range   Prothrombin Time 15.0 11.4 - 15.2 seconds   INR 1.2 0.8 - 1.2    Comment: (NOTE) INR goal varies based on device and disease states. Performed at [facility], [address] [place], [place] [address]   CBC     Status: Abnormal   Collection Time: 09/20/20  6:29 AM  Result Value Ref Range   WBC 4.2 4.0 - 10.5 K/uL   RBC 3.23 (L) 4.22 - 5.81 MIL/uL   Hemoglobin 9.6 (L) 13.0 - 17.0 g/dL   HCT 31.5 (L) 39 - 52 %   MCV 92.9 80.0 - 100.0 fL   MCH 29.7 26.0 - 34.0 pg   MCHC 32.0 30.0 - 36.0 g/dL   RDW 17.6 (H) 16.0 - 73.7 %   Platelets 211 150 - 400 K/uL   nRBC 0.0 0.0 - 0.2 %    Comment: Performed at [facility], [address] [place], [place] [address]  Basic metabolic panel     Status: Abnormal   Collection Time: 09/20/20  6:29 AM  Result Value Ref Range   Sodium 135 135 - 145 mmol/L   Potassium 4.0 3.5 - 5.1 mmol/L   Chloride 101 98 - 111 mmol/L   CO2 26 22 - 32 mmol/L   Glucose, Bld 99 70 - 99 mg/dL    Comment: Glucose reference range applies only to samples taken after fasting for at least 8 hours.   BUN 19 8 - 23 mg/dL   Creatinine, Ser 9.48 0.61 - 1.24 mg/dL   Calcium 8.8 (L) 8.9 - 10.3 mg/dL   GFR, Estimated >54 >62 mL/min    Comment:  (NOTE) Calculated using the CKD-EPI Creatinine Equation (2021)    Anion gap 8 5 - 15    Comment: Performed at [facility], [address] [place], [place] [address]  APTT     Status: Abnormal   Collection Time: 09/20/20  1:44 PM  Result Value Ref Range   aPTT 124 (H) 24 - 36 seconds    Comment:        IF BASELINE aPTT IS ELEVATED, SUGGEST PATIENT RISK ASSESSMENT BE USED TO DETERMINE APPROPRIATE ANTICOAGULANT THERAPY. Performed at [facility], [address] [place], [place] [address]   Heparin level (unfractionated)     Status: Abnormal   Collection Time: 09/20/20  9:39 PM  Result Value Ref Range   Heparin Unfractionated 0.23 (L) 0.30 - 0.70 IU/mL    Comment: (NOTE) If heparin results are below expected values, and patient dosage has  been confirmed, suggest follow up testing of antithrombin III levels. Performed at [facility], [address] [place], [place] [address]   Heparin level (unfractionated)     Status: None   Collection Time: 09/21/20  3:51 AM  Result Value Ref Range   Heparin Unfractionated 0.34 0.30 - 0.70 IU/mL    Comment: (NOTE) If heparin results are below expected values, and patient dosage has  been confirmed, suggest follow up testing of antithrombin III levels. Performed at [facility], [address] [place], [place] [address]   CBC     Status: Abnormal   Collection Time: 09/21/20  3:51 AM  Result Value Ref Range   WBC 5.6 4.0 - 10.5 K/uL   RBC 3.33 (L) 4.22 - 5.81 MIL/uL   Hemoglobin 9.7 (L) 13.0 - 17.0 g/dL   HCT 78.9 (L) 39 - 52 %   MCV 91.6 80.0 - 100.0 fL  MCH 29.1 26.0 - 34.0 pg   MCHC 31.8 30.0 - 36.0 g/dL   RDW 55.7 (H) 32.2 - 02.5 %   Platelets 220 150 - 400 K/uL   nRBC 0.0 0.0 - 0.2 %    Comment: Performed at [facility], [address] [place], [place] [address]  Protime-INR     Status: Abnormal   Collection Time: 09/21/20  3:51 AM  Result Value Ref Range   Prothrombin  Time 16.3 (H) 11.4 - 15.2 seconds   INR 1.4 (H) 0.8 - 1.2    Comment: (NOTE) INR goal varies based on device and disease states. Performed at [facility], [address] [place], [place] [address]   Heparin level (unfractionated)     Status: None   Collection Time: 09/21/20 12:15 PM  Result Value Ref Range   Heparin Unfractionated 0.30 0.30 - 0.70 IU/mL    Comment: (NOTE) If heparin results are below expected values, and patient dosage has  been confirmed, suggest follow up testing of antithrombin III levels. Performed at [facility], [address] [place], [place] [address]   Blood gas, arterial     Status: Abnormal   Collection Time: 09/21/20  6:00 PM  Result Value Ref Range   FIO2 1.00    Delivery systems HI FLOW NASAL CANNULA    pH, Arterial 7.48 (H) 7.35 - 7.45   pCO2 arterial 33 32 - 48 mmHg   pO2, Arterial 374 (H) 83 - 108 mmHg   Bicarbonate 24.6 20.0 - 28.0 mmol/L   Acid-Base Excess 1.4 0.0 - 2.0 mmol/L   O2 Saturation 100.0 %   Patient temperature 37.0    Collection site RIGHT RADIAL    Sample type ARTERIAL DRAW    Allens test (pass/fail) PASS PASS    Comment: Performed at [facility], [address] [place], [place] [address]  Glucose, capillary     Status: Abnormal   Collection Time: 09/21/20  6:39 PM  Result Value Ref Range   Glucose-Capillary 101 (H) 70 - 99 mg/dL    Comment: Glucose reference range applies only to samples taken after fasting for at least 8 hours.   DG Chest Port 1 View  Result Date: 09/21/2020 CLINICAL DATA:  86 year old male with hypoxia. EXAM: PORTABLE CHEST 1 VIEW COMPARISON:  Chest radiograph dated 09/16/2020. FINDINGS: There is stable cardiomegaly with vascular congestion and edema. Overall interval progression of the CHF compared to the prior radiograph. Small bilateral pleural effusions. Superimposed pneumonia is not excluded. No pneumothorax. Median sternotomy wires and mechanical heart valve. No  acute osseous pathology. IMPRESSION: Cardiomegaly with CHF and small bilateral pleural effusions, worsened since the prior radiograph. Electronically Signed   By: Elgie Collard M.D.   On: 09/21/2020 18:01    Review of Systems  Unable to perform ROS: Mental status change    Blood pressure 113/74, pulse 98, temperature (!) 101.7 F (38.7 C), temperature source Axillary, resp. rate 19, height 5\' 11"  (1.803 m), weight 99.5 kg, SpO2 92 %. Physical Exam Constitutional:      Appearance: He is obese. He is ill-appearing.  HENT:     Head: Normocephalic and atraumatic.     Mouth/Throat:     Mouth: Mucous membranes are moist.  Eyes:     Extraocular Movements: Extraocular movements intact.     Pupils: Pupils are equal, round, and reactive to light.  Cardiovascular:     Rate and Rhythm: Tachycardia present.  Pulmonary:  Effort: Tachypnea and respiratory distress present.  Abdominal:     General: Abdomen is protuberant.     Palpations: Abdomen is soft.     Tenderness: There is no abdominal tenderness.  Neurological:     Mental Status: He is disoriented and confused.     GCS: GCS eye subscore is 4. GCS verbal subscore is 2. GCS motor subscore is 5.      Assessment/Plan  ACUTE HYPOXIC FAILURE Marginal need, more required to temporize ventilation Much improved with a single dose of Dilaudid resolving air hunger  ALTERED MENTAL STATUS The patient will likely need a CT of the head if his mental status is not improved post anti-pyretic It would seem this is a sepsis related encephalopathy, but r/o ICH with anticoagulants  FLASH EDEMA/ACUTE dCHF Rate control post haste, lopressor given  Single dose lasix NIV with narrow delta, maintain EPAP He is not terribly hypoxic, blood gas pO2 in excess of 300 once on NRBM Repeat echo, check BNP  SEPSIS/SIRS Empiric broad coverage Send cultures BP maintained at this time Check WBC/PCT  Remainder of work-up in progress  Critical care  time 6957m  Elyn Aquasonald Danniel Tones, MD 09/21/2020, 7:08 PM

## 2020-09-21 NOTE — Progress Notes (Addendum)
Progress Note    [name]  JKD:[id number] DOB: 09-28-1931  DOA: 09/13/2020 PCP: Gracelyn Nurse, MD      Brief Narrative:    Medical records reviewed and are as summarized below:  [name] is a 86 y.o. male with medical history significant for aortic stenosis status post mechanical aortic valve replacement on Coumadin, permanent atrial fibrillation, chronic diastolic CHF, hypertension, CAD, chronic venous stasis, chronic anemia, GERD, history of C. difficile infection, recent GI bleed in September 2021) severe anemia with hemoglobin of 4.  He was referred to the emergency room at the behest of his primary care physician because of generalized weakness, lightheadedness and anemia with hemoglobin of 7.4.  He was admitted to the hospital for acute blood loss anemia likely from GI bleed.    Assessment/Plan:   Principal Problem:   Upper GI bleeding Active Problems:   Hypertension   Coronary artery disease   S/P AVR   Warfarin anticoagulation   History of mechanical aortic valve replacement   Chronic diastolic CHF (congestive heart failure) (HCC)   AKI (acute kidney injury) (HCC)   Permanent atrial fibrillation (HCC)   Acute blood loss anemia   History of lower GI bleeding   Symptomatic anemia   Body mass index is 30.59 kg/m.  (Obesity)  Acute blood loss anemia secondary to GI bleeding: s/p capsule endoscopy on 09/16/2019 which showed copious amount of coffee-ground liquid in the entire small bowel and likely small bowel evaluations.  S/p small bowel enteroscopy on 09/17/2020.  This showed nonbleeding duodenal ulcers with a clean ulcer base and "recent bleeding erosive to adenopathy treated with argon beam coagulation.  S/p blood transfusion with 1 unit of PRBCs on 09/13/2020. Continue Protonix.  Monitor H&H.  Permanent atrial fibrillation, status post mechanical aortic valve replacement: INR subtherapeutic at 1.4.  Continue IV heparin drip and  monitor heparin level per protocol. Continue Coumadin and monitor INR.    CAD: Continue aspirin, Coumadin and metoprolol  Chronic diastolic CHF: Compensated.  Continue torsemide  Anxiety: Continue BuSpar.  Xanax as needed.   Diet Order            Diet Heart Room service appropriate? Yes; Fluid consistency: Thin  Diet effective now                    Consultants:  Gastroenterologist  Cardiologist  Procedures:  Small bowel endoscopy    Medications:   . allopurinol  300 mg Oral Daily  . busPIRone  5 mg Oral BID  . docusate sodium  200 mg Oral BID  . DULoxetine  30 mg Oral Daily  . finasteride  5 mg Oral Daily  . metoprolol succinate  12.5 mg Oral Daily  . pantoprazole  40 mg Oral BID AC  . polyethylene glycol  17 g Oral Daily  . rOPINIRole  1 mg Oral Daily  . rOPINIRole  2 mg Oral TID  . torsemide  40 mg Oral BID  . traZODone  50 mg Oral QHS  . warfarin  5 mg Oral ONCE-1600  . Warfarin - Pharmacist Dosing Inpatient   Does not apply q1600   Continuous Infusions: . heparin 1,500 Units/hr (09/21/20 1406)     Anti-infectives (From admission, onward)   None             Family Communication/Anticipated D/C date and plan/Code Status   DVT prophylaxis: SCDs Start: 09/13/20 1948 warfarin (COUMADIN) tablet 5 mg  Code Status: Full Code  Family Communication: Plan discussed with his daughter, Cala Bradford Disposition Plan:    Status is: Inpatient  Remains inpatient appropriate because:IV treatments appropriate due to intensity of illness or inability to take PO and Mechanical aortic valve with subtherapeutic INR   Dispo: The patient is from: Home              Anticipated d/c is to: SNF              Anticipated d/c date is: 2 days              Patient currently is not medically stable to d/c.           Subjective:   He complains of anxiety and reportedly had a panic attack last night.  He requested something for anxiety.  No shortness  of breath or chest pain.  Objective:    Vitals:   09/20/20 1608 09/20/20 1922 09/21/20 0512 09/21/20 1118  BP: (!) 145/98 (!) 112/94 129/66 (!) 156/69  Pulse: (!) 49 63 66 69  Resp: 20 18 20 20   Temp: (!) 97.5 F (36.4 C) 97.9 F (36.6 C) 97.9 F (36.6 C) (!) 97.5 F (36.4 C)  TempSrc: Oral Oral Oral Oral  SpO2: 94% 93% 95% 93%  Weight:   99.5 kg   Height:       No data found.   Intake/Output Summary (Last 24 hours) at 09/21/2020 1514 Last data filed at 09/21/2020 1100 Gross per 24 hour  Intake 492.03 ml  Output 236 ml  Net 256.03 ml   Filed Weights   09/18/20 0634 09/19/20 0641 09/21/20 0512  Weight: 95.9 kg 97.7 kg 99.5 kg    Exam:   GEN: NAD SKIN: Warm and dry EYES: No pallor or icterus ENT: MMM CV: RRR PULM: CTA B ABD: soft, ND, NT, +BS CNS: AAO x 3, non focal EXT: Trace bilateral leg edema, no tenderness    Data Reviewed:   I have personally reviewed following labs and imaging studies:  Labs: Labs show the following:   Basic Metabolic Panel: Recent Labs  Lab 09/15/20 0438 09/15/20 0438 09/16/20 0542 09/16/20 0542 09/17/20 0517 09/17/20 0517 09/18/20 0607 09/18/20 0607 09/19/20 0532 09/20/20 0629  NA 133*   < > 135  --  134*  --  135  --  136 135  K 3.5   < > 3.4*   < > 3.5   < > 3.7   < > 3.8 4.0  CL 98   < > 100  --  100  --  100  --  99 101  CO2 26   < > 26  --  26  --  26  --  28 26  GLUCOSE 102*   < > 104*  --  99  --  91  --  88 99  BUN 39*   < > 25*  --  17  --  16  --  18 19  CREATININE 1.21   < > 0.91  --  0.79  --  0.81  --  0.88 0.85  CALCIUM 8.3*   < > 8.6*  --  8.5*  --  8.4*  --  8.8* 8.8*  MG 2.7*  --  2.9*  --  2.7*  --  2.4  --  2.5*  --    < > = values in this interval not displayed.   GFR Estimated Creatinine Clearance: 73.6 mL/min (by C-G formula based  on SCr of 0.85 mg/dL). Liver Function Tests: No results for input(s): AST, ALT, ALKPHOS, BILITOT, PROT, ALBUMIN in the last 168 hours. No results for input(s):  LIPASE, AMYLASE in the last 168 hours. No results for input(s): AMMONIA in the last 168 hours. Coagulation profile Recent Labs  Lab 09/19/20 0532 09/20/20 0629 09/21/20 0351  INR 1.2 1.2 1.4*    CBC: Recent Labs  Lab 09/17/20 0517 09/18/20 0653 09/19/20 0532 09/20/20 0629 09/21/20 0351  WBC 7.2 5.7 4.5 4.2 5.6  HGB 9.4* 9.1* 9.0* 9.6* 9.7*  HCT 28.8* 28.4* 28.9* 30.0* 30.5*  MCV 91.1 91.9 92.0 92.9 91.6  PLT 227 218 209 211 220   Cardiac Enzymes: No results for input(s): CKTOTAL, CKMB, CKMBINDEX, TROPONINI in the last 168 hours. BNP (last 3 results) No results for input(s): PROBNP in the last 8760 hours. CBG: Recent Labs  Lab 09/16/20 2133  GLUCAP 131*   D-Dimer: No results for input(s): DDIMER in the last 72 hours. Hgb A1c: No results for input(s): HGBA1C in the last 72 hours. Lipid Profile: No results for input(s): CHOL, HDL, LDLCALC, TRIG, CHOLHDL, LDLDIRECT in the last 72 hours. Thyroid function studies: No results for input(s): TSH, T4TOTAL, T3FREE, THYROIDAB in the last 72 hours.  Invalid input(s): FREET3 Anemia work up: No results for input(s): VITAMINB12, FOLATE, FERRITIN, TIBC, IRON, RETICCTPCT in the last 72 hours. Sepsis Labs: Recent Labs  Lab 09/18/20 0653 09/19/20 0532 09/20/20 0629 09/21/20 0351  WBC 5.7 4.5 4.2 5.6    Microbiology Recent Results (from the past 240 hour(s))  Respiratory Panel by RT PCR (Flu A&B, Covid) - Nasopharyngeal Swab     Status: None   Collection Time: 09/13/20  5:55 PM   Specimen: Nasopharyngeal Swab  Result Value Ref Range Status   SARS Coronavirus 2 by RT PCR NEGATIVE NEGATIVE Final    Comment: (NOTE) SARS-CoV-2 target nucleic acids are NOT DETECTED.  The SARS-CoV-2 RNA is generally detectable in upper respiratoy specimens during the acute phase of infection. The lowest concentration of SARS-CoV-2 viral copies this assay can detect is 131 copies/mL. A negative result does not preclude SARS-Cov-2 infection  and should not be used as the sole basis for treatment or other patient management decisions. A negative result may occur with  improper specimen collection/handling, submission of specimen other than nasopharyngeal swab, presence of viral mutation(s) within the areas targeted by this assay, and inadequate number of viral copies (<131 copies/mL). A negative result must be combined with clinical observations, patient history, and epidemiological information. The expected result is Negative.  Fact Sheet for Patients:  https://www.moore.com/  Fact Sheet for Healthcare Providers:  https://www.young.biz/  This test is no t yet approved or cleared by the [place] FDA and  has been authorized for detection and/or diagnosis of SARS-CoV-2 by FDA under an Emergency Use Authorization (EUA). This EUA will remain  in effect (meaning this test can be used) for the duration of the COVID-19 declaration under Section 564(b)(1) of the Act, 21 U.S.C. section 360bbb-3(b)(1), unless the authorization is terminated or revoked sooner.     Influenza A by PCR NEGATIVE NEGATIVE Final   Influenza B by PCR NEGATIVE NEGATIVE Final    Comment: (NOTE) The Xpert Xpress SARS-CoV-2/FLU/RSV assay is intended as an aid in  the diagnosis of influenza from Nasopharyngeal swab specimens and  should not be used as a sole basis for treatment. Nasal washings and  aspirates are unacceptable for Xpert Xpress SARS-CoV-2/FLU/RSV  testing.  Fact Sheet for Patients:  https://www.moore.com/https://www.fda.gov/media/142436/download  Fact Sheet for Healthcare Providers: https://www.young.biz/https://www.fda.gov/media/142435/download  This test is not yet approved or cleared by the [place] FDA and  has been authorized for detection and/or diagnosis of SARS-CoV-2 by  FDA under an Emergency Use Authorization (EUA). This EUA will remain  in effect (meaning this test can be used) for the duration of the  Covid-19 declaration  under Section 564(b)(1) of the Act, 21  U.S.C. section 360bbb-3(b)(1), unless the authorization is  terminated or revoked. Performed at [facility], [address] [place], [place] [address]     Procedures and diagnostic studies:  No results found.             LOS: 8 days   Ilse Billman  [facility]   Pager on [email or web address]. If 7PM-7AM, please contact night-coverage at [email or web address]     09/21/2020, 3:14 PM

## 2020-09-21 NOTE — Progress Notes (Addendum)
I was called to the bedside for rapid response.  Patient was confused, tachycardic, hypertensive and hypoxemic.  He had previously received low-dose xanax and low-dose Ativan for anxiety.  He was placed on oxygen via heated high flow nasal cannula.  He was given IV Lasix for suspected acute pulmonary edema.  Chest x-ray that was ordered confirmed CHF pattern with interstitial edema.  ABG showed normal pH without any hypercapnia and PO2 was actually very high because ABG was done on 100% oxygen. BiPAP was ordered for increased work of breathing. CT head has been ordered to rule out acute intracranial abnormality.  Later on, he developed a fever with temperature of 101.7.  Decision was made to transfer the patient to the ICU for further management.  Case was discussed with intensivist, Dr. Earlie Server.  He has started the patient on empiric IV antibiotics for suspected sepsis.  I called Ms. Prudencio Burly to update her on the plan of care.

## 2020-09-21 NOTE — Consult Note (Signed)
Pharmacy Antibiotic Note  [name] is a 86 y.o. male admitted on 09/13/2020 with bacteremia. Pt admitted with altered mental status and shortness of breath. PMH includes Afib, diastolic failure and aortic stenosis with AVR, recent GIB (Sept). Pt was running a fever on admission of 101.5. Pharmacy has been consulted for vancomycin dosing.  Fever 10/28 @ 1801 101.73F  Plan: Vancomycin 2 g IV x 1 dose Vancomycin 1250 mg IV every 12 hours.  Goal trough 15-20 mcg/mL.  Monitor Scr   Height: 5\' 11"  (180.3 cm) Weight: 99.5 kg (219 lb 5.7 oz) IBW/kg (Calculated) : 75.3  Temp (24hrs), Avg:98.7 F (37.1 C), Min:97.5 F (36.4 C), Max:101.7 F (38.7 C)  Recent Labs  Lab 09/16/20 0542 09/16/20 2219 09/17/20 0517 09/18/20 0607 09/18/20 0653 09/19/20 0532 09/20/20 0629 09/21/20 0351  WBC 7.4   < > 7.2  --  5.7 4.5 4.2 5.6  CREATININE 0.91  --  0.79 0.81  --  0.88 0.85  --    < > = values in this interval not displayed.    Estimated Creatinine Clearance: 73.6 mL/min (by C-G formula based on SCr of 0.85 mg/dL).    Allergies  Allergen Reactions  . Sulfa Antibiotics Other (See Comments)    Reaction:  Unknown   . Levofloxacin Nausea Only    Antimicrobials this admission: 10/28 cefepime >>  10/28 vancomycin >>   Microbiology results: 10/28 UCx: pending 10/28 MRSA PCR: pending  Thank you for allowing pharmacy to be a part of this patient's care.  11/28, PharmD Pharmacy Resident  09/21/2020 7:26 PM

## 2020-09-21 NOTE — TOC Progression Note (Signed)
Transition of Care [facility]) - Progression Note    Patient Details  Name: [name] MRN: [id number] Date of Birth: [birth year]  [facility] CM/SW Contact  Maree Krabbe, LCSW Phone Number: 09/21/2020, 10:44 AM  Clinical Narrative:  CSW had started auth yesterday prior to progression. At progression it was stated that pt would not d/c due to INR not being therapeutic. CSW received a call from [facility] stating a peer to peer would need to be done by 11 today (10/28). CSW called at 9:30 this morning to cancel authorization because pt is not medically stable. CSW will restart auth once pt is medically stable.     Expected Discharge Plan: Skilled Nursing Facility Barriers to Discharge: Continued Medical Work up  Expected Discharge Plan and Services Expected Discharge Plan: Skilled Nursing Facility In-house Referral: NA   Post Acute Care Choice: Skilled Nursing Facility Living arrangements for the past 2 months: Assisted Living Facility                                       Social Determinants of Health (SDOH) Interventions    Readmission Risk Interventions Readmission Risk Prevention Plan 09/18/2020 08/10/2020 08/04/2020  Transportation Screening Complete Complete Complete  PCP or Specialist Appt within 3-5 Days Complete Complete -  HRI or Home Care Consult Complete Complete (No Data)  Social Work Consult for Recovery Care Planning/Counseling Complete - Complete  Palliative Care Screening Complete Not Applicable Not Applicable  Medication Review Oceanographer) Complete Complete Complete  Some recent data might be hidden

## 2020-09-21 NOTE — Progress Notes (Addendum)
Pt Rapid response, brought to ICU 5 on Bi-pap. Report at bedside. Pt is agitated/disoriented pulling at lines, Bi-pap mask, and monitoring equipment. 1 mg dilaudid administered. 5 mg lopressor administered for elevated BP and HR. Pt appears more comfortable. VSS at this time. Pt has fever, IV tylenol ordered. IV team to place second IV. Report given to [facility].

## 2020-09-21 NOTE — Progress Notes (Signed)
Mobility Specialist - Progress Note   09/21/20 1209  Mobility  Activity Ambulated in room  Level of Assistance Standby assist, set-up cues, supervision of patient - no hands on (CGA for safety)  Assistive Device Front wheel walker  Distance Ambulated (ft) 30 ft  Mobility Response Tolerated well  Mobility performed by Mobility specialist  $Mobility charge 1 Mobility    Pt laying in bed upon arrival. Pt agreed to session. Pt able to get to EOB mod. Independently. Pt S2S SBA, request bed be elevated to stand. Pt ambulated 30' total in room w/ RW SBA. Further ambulation deferred d/t pt feeling fatigue and having "8/10" pain in bilat knees. Overall, pt tolerated session well. Noted heavy breathing, pt denies SOB. Unable to get O2 reading d/t poor pleth. Pt left sitting on recliner w/ lunch placed in front of him. Call bell and phone placed in reach.     Jakoby Melendrez Mobility Specialist  09/21/20, 12:12 PM

## 2020-09-21 NOTE — Consult Note (Signed)
ANTICOAGULATION CONSULT NOTE   Pharmacy Consult for Heparin and warfarin Indication: atrial fibrillation and mAVR  Allergies  Allergen Reactions  . Sulfa Antibiotics Other (See Comments)    Reaction:  Unknown   . Levofloxacin Nausea Only    Patient Measurements: Height: 5\' 11"  (180.3 cm) Weight: 99.5 kg (219 lb 5.7 oz) IBW/kg (Calculated) : 75.3 Heparin Dosing Weight: 94.9 kg  Vital Signs: Temp: 97.9 F (36.6 C) (10/28 0512) Temp Source: Oral (10/28 0512) BP: 129/66 (10/28 0512) Pulse Rate: 66 (10/28 0512)  Labs: Recent Labs    09/19/20 0532 09/19/20 0532 09/20/20 0629 09/20/20 1344 09/20/20 2139 09/21/20 0351  HGB 9.0*   < > 9.6*  --   --  9.7*  HCT 28.9*  --  30.0*  --   --  30.5*  PLT 209  --  211  --   --  220  APTT  --   --   --  124*  --   --   LABPROT 14.7  --  15.0  --   --  16.3*  INR 1.2  --  1.2  --   --  1.4*  HEPARINUNFRC  --   --   --   --  0.23* 0.34  CREATININE 0.88  --  0.85  --   --   --    < > = values in this interval not displayed.    Estimated Creatinine Clearance: 73.6 mL/min (by C-G formula based on SCr of 0.85 mg/dL).   Medical History: Past Medical History:  Diagnosis Date  . Anxiety 10/11  . Aortic stenosis    a. s/p mechcanical AVR, 2002; b. 10/2018 Echo: Triv AI, mean grad 11/2018.  . Bradycardia    chronic, no symptoms 07/2010  . C. difficile colitis   . Carotid bruit    dopplers in past, no abnormalities  . Chronic diastolic CHF (congestive heart failure) (HCC)    a. Echo 03/2015: EF 60-65%, no RWMA, GR1DD, mild BAE, mild to mod MR, mod TR, PASP 65 mmHg; b. 01/2017 Echo: EF 55-60%, NRWMA, grade 1 diastolic dysfunction.  Normal functioning prosthetic aortic valve.  Mean gradient 50 mmHg.  Sev TR. PASP 02/2017; c. 10/2018 Echo: EF 55-60%, Triv AI, mod dil LA, mod-sev TR, PASP 35-40, mild to mod red RV fxn.  . Coronary artery disease    a. mild, cath, 08/2010; b. medically managed  . Decreased hearing    Right ear  . Depression    . Gastric ulcer   . GERD (gastroesophageal reflux disease)   . Hypertension    BP higher than usual 04/19/10; amlodipine increased by telephone  . Mod-Sev Tricuspid regurgitation    a. 10/2018 Echo: Mod-Sev TR, PASP 35-16mmHg.  43m RLS (restless legs syndrome) 08/23/2015  . S/P AVR    a. [employer] Jude. mechanical 2002; b. echo 08/2010 EF 60%, trival AI, mild MR, AVR working well; c. on longterm warfarin tx  . SOB (shortness of breath) 10/11   08/2010,Episodes at 5 AM, eventually felt to be anxiety, after complete workup including catheterization, pt greatly improved with anxiety meds 11/11    Medications:  Medications Prior to Admission  Medication Sig Dispense Refill Last Dose  . allopurinol (ZYLOPRIM) 300 MG tablet Take 300 mg by mouth daily.    09/13/2020 at 0900  . Cetirizine HCl 10 MG CAPS Take 10 mg by mouth daily.   09/13/2020 at 0900  . finasteride (PROSCAR) 5 MG tablet Take 5 mg by mouth daily.  09/13/2020 at 0900  . metoprolol succinate (TOPROL-XL) 25 MG 24 hr tablet Take 0.5 tablets (12.5 mg total) by mouth daily. 90 tablet 3 09/13/2020 at 0900  . pantoprazole (PROTONIX) 40 MG tablet Take 40 mg by mouth daily.   09/13/2020 at 0900  . potassium chloride (KLOR-CON) 10 MEQ tablet TAKE 3 TABLETS (30 MEQ) BY MOUTH DAILY. TAKE AN ADDITIONAL 2TABS ON THE DAYS YOU TAKE METOLAZONE 294 tablet 0 09/13/2020 at 0900  . rOPINIRole (REQUIP) 2 MG tablet TAKE 1 TABLET BY MOUTH 4  TIMES DAILY (Patient taking differently: Take 2 mg by mouth in the morning, at noon, in the evening, and at bedtime. Take 1 tablet by mouth 4  times daily) 360 tablet 3 09/13/2020 at 0900  . torsemide (DEMADEX) 20 MG tablet Take 2 tablets (40 mg total) by mouth daily. Take additional 40 mg for 3 pounds weight gain . (Patient taking differently: Take 40 mg by mouth 2 (two) times daily. ) 60 tablet 2 09/13/2020 at 0900  . traZODone (DESYREL) 50 MG tablet Take 50 mg by mouth at bedtime.   09/12/2020 at 1600  . warfarin  (COUMADIN) 2.5 MG tablet Take 1 tablet (2.5 mg total) by mouth daily at 4 PM. Takes daily at 1700 (Patient taking differently: Take 2.5-3.75 mg by mouth daily. Monday, Tuesday, thursday,friday,saturday, sunday 3.75mg -Wednesday)   09/12/2020 at 1600  . Acetaminophen 500 MG capsule Take 2 capsules by mouth every 8 (eight) hours as needed.   PRN at PRN  . busPIRone (BUSPAR) 5 MG tablet Take 1 tablet (5 mg total) by mouth 2 (two) times daily. (Patient not taking: Reported on 09/13/2020)   Not Taking at Unknown time  . DULoxetine (CYMBALTA) 30 MG capsule Take 1 capsule (30 mg total) by mouth daily. (Patient not taking: Reported on 09/13/2020) 30 capsule 3 Not Taking at Unknown time  . metolazone (ZAROXOLYN) 2.5 MG tablet Take 1 tablet (2.5 mg total) by mouth 2 (two) times a week. 72 tablet 3 09/11/2020   Scheduled:  . busPIRone  5 mg Oral BID  . docusate sodium  200 mg Oral BID  . DULoxetine  30 mg Oral Daily  . furosemide  40 mg Oral Daily  . metoprolol succinate  12.5 mg Oral Daily  . pantoprazole  40 mg Oral BID AC  . polyethylene glycol  17 g Oral Daily  . rOPINIRole  1 mg Oral Daily  . rOPINIRole  2 mg Oral TID  . traZODone  50 mg Oral QHS  . Warfarin - Pharmacist Dosing Inpatient   Does not apply q1600   Infusions:  . heparin 1,450 Units/hr (09/21/20 0412)   PRN: acetaminophen **OR** acetaminophen, bisacodyl, morphine injection, ondansetron **OR** ondansetron (ZOFRAN) IV Anti-infectives (From admission, onward)   None      Assessment: Pharmacy was consulted to start heparin and restart warfarin for mAVR and afib. INR is currently @1 .4. Most recent HL was 0.34.  Hgb 9.7, Plt 220.  Provider wants to continue heparin bridge until INR is therapeutic. Pt has an hx of GI bleeds. GI cleared patient to restart anticoagulation. NO major DDI with warfarin.   Home dose warfarin:  warfarin 2.5 mg [id number] and 3.75 mg on Wednesday  Date INR Warfarin Dose  10/20 1.4 baseline  10/25 -- 2.5 mg    10/26 1.2 4.0 mg  10/27 1.2 4.0 mg  10/28 1.4    Goal of Therapy:  INR 2.5 to 3.5 and Heparin level 0.3 to 0.7 Monitor platelets by  anticoagulation protocol: Yes   Plan:  Heparin level is currently therapeutic.  Will continue to run heparin infusion at 1450 units/hr and recheck for 2nd therapeutic anti-Xa level at 1200 today, then daily while on heparin. Will continue to monitor CBC daily while on heparin.   INR still subtherapeutic @ 1.4. Pt received warfarin 4 mg for past 2 days.  Will increase dose by 25% and give warfarin 5 mg x 1 tonight. Daily INR ordered.   Bridging warfarin with heparin until INR is therapeutic and then can d/c heparin infusion.   Plan is to have INR therapeutic prior to discharge per MD.   Terrial Rhodes, PharmD 09/21/2020,7:35 AM

## 2020-09-21 NOTE — Consult Note (Signed)
ANTICOAGULATION CONSULT NOTE   Pharmacy Consult for Heparin and warfarin Indication: atrial fibrillation and mAVR  Allergies  Allergen Reactions  . Sulfa Antibiotics Other (See Comments)    Reaction:  Unknown   . Levofloxacin Nausea Only    Patient Measurements: Height: 5\' 11"  (180.3 cm) Weight: 99.5 kg (219 lb 5.7 oz) IBW/kg (Calculated) : 75.3 Heparin Dosing Weight: 94.9 kg  Vital Signs: Temp: 97.5 F (36.4 C) (10/28 1118) Temp Source: Oral (10/28 1118) BP: 156/69 (10/28 1118) Pulse Rate: 69 (10/28 1118)  Labs: Recent Labs    09/19/20 0532 09/19/20 0532 09/20/20 0629 09/20/20 1344 09/20/20 2139 09/21/20 0351 09/21/20 1215  HGB 9.0*   < > 9.6*  --   --  9.7*  --   HCT 28.9*  --  30.0*  --   --  30.5*  --   PLT 209  --  211  --   --  220  --   APTT  --   --   --  124*  --   --   --   LABPROT 14.7  --  15.0  --   --  16.3*  --   INR 1.2  --  1.2  --   --  1.4*  --   HEPARINUNFRC  --   --   --   --  0.23* 0.34 0.30  CREATININE 0.88  --  0.85  --   --   --   --    < > = values in this interval not displayed.    Estimated Creatinine Clearance: 73.6 mL/min (by C-G formula based on SCr of 0.85 mg/dL).   Medical History: Past Medical History:  Diagnosis Date  . Anxiety 10/11  . Aortic stenosis    a. s/p mechcanical AVR, 2002; b. 10/2018 Echo: Triv AI, mean grad 11/2018.  . Bradycardia    chronic, no symptoms 07/2010  . C. difficile colitis   . Carotid bruit    dopplers in past, no abnormalities  . Chronic diastolic CHF (congestive heart failure) (HCC)    a. Echo 03/2015: EF 60-65%, no RWMA, GR1DD, mild BAE, mild to mod MR, mod TR, PASP 65 mmHg; b. 01/2017 Echo: EF 55-60%, NRWMA, grade 1 diastolic dysfunction.  Normal functioning prosthetic aortic valve.  Mean gradient 50 mmHg.  Sev TR. PASP 02/2017; c. 10/2018 Echo: EF 55-60%, Triv AI, mod dil LA, mod-sev TR, PASP 35-40, mild to mod red RV fxn.  . Coronary artery disease    a. mild, cath, 08/2010; b. medically  managed  . Decreased hearing    Right ear  . Depression   . Gastric ulcer   . GERD (gastroesophageal reflux disease)   . Hypertension    BP higher than usual 04/19/10; amlodipine increased by telephone  . Mod-Sev Tricuspid regurgitation    a. 10/2018 Echo: Mod-Sev TR, PASP 35-6mmHg.  43m RLS (restless legs syndrome) 08/23/2015  . S/P AVR    a. St. Jude. mechanical 2002; b. echo 08/2010 EF 60%, trival AI, mild MR, AVR working well; c. on longterm warfarin tx  . SOB (shortness of breath) 10/11   08/2010,Episodes at 5 AM, eventually felt to be anxiety, after complete workup including catheterization, pt greatly improved with anxiety meds 11/11    Medications:  Medications Prior to Admission  Medication Sig Dispense Refill Last Dose  . allopurinol (ZYLOPRIM) 300 MG tablet Take 300 mg by mouth daily.    09/13/2020 at 0900  . Cetirizine HCl 10 MG CAPS  Take 10 mg by mouth daily.   09/13/2020 at 0900  . finasteride (PROSCAR) 5 MG tablet Take 5 mg by mouth daily.     09/13/2020 at 0900  . metoprolol succinate (TOPROL-XL) 25 MG 24 hr tablet Take 0.5 tablets (12.5 mg total) by mouth daily. 90 tablet 3 09/13/2020 at 0900  . pantoprazole (PROTONIX) 40 MG tablet Take 40 mg by mouth daily.   09/13/2020 at 0900  . potassium chloride (KLOR-CON) 10 MEQ tablet TAKE 3 TABLETS (30 MEQ) BY MOUTH DAILY. TAKE AN ADDITIONAL 2TABS ON THE DAYS YOU TAKE METOLAZONE 294 tablet 0 09/13/2020 at 0900  . rOPINIRole (REQUIP) 2 MG tablet TAKE 1 TABLET BY MOUTH 4  TIMES DAILY (Patient taking differently: Take 2 mg by mouth in the morning, at noon, in the evening, and at bedtime. Take 1 tablet by mouth 4  times daily) 360 tablet 3 09/13/2020 at 0900  . torsemide (DEMADEX) 20 MG tablet Take 2 tablets (40 mg total) by mouth daily. Take additional 40 mg for 3 pounds weight gain . (Patient taking differently: Take 40 mg by mouth 2 (two) times daily. ) 60 tablet 2 09/13/2020 at 0900  . traZODone (DESYREL) 50 MG tablet Take 50 mg by  mouth at bedtime.   09/12/2020 at 1600  . warfarin (COUMADIN) 2.5 MG tablet Take 1 tablet (2.5 mg total) by mouth daily at 4 PM. Takes daily at 1700 (Patient taking differently: Take 2.5-3.75 mg by mouth daily. Monday, Tuesday, thursday,friday,saturday, sunday 3.75mg -Wednesday)   09/12/2020 at 1600  . Acetaminophen 500 MG capsule Take 2 capsules by mouth every 8 (eight) hours as needed.   PRN at PRN  . busPIRone (BUSPAR) 5 MG tablet Take 1 tablet (5 mg total) by mouth 2 (two) times daily. (Patient not taking: Reported on 09/13/2020)   Not Taking at Unknown time  . DULoxetine (CYMBALTA) 30 MG capsule Take 1 capsule (30 mg total) by mouth daily. (Patient not taking: Reported on 09/13/2020) 30 capsule 3 Not Taking at Unknown time  . metolazone (ZAROXOLYN) 2.5 MG tablet Take 1 tablet (2.5 mg total) by mouth 2 (two) times a week. 72 tablet 3 09/11/2020   Scheduled:  . busPIRone  5 mg Oral BID  . docusate sodium  200 mg Oral BID  . DULoxetine  30 mg Oral Daily  . furosemide  40 mg Oral Daily  . metoprolol succinate  12.5 mg Oral Daily  . pantoprazole  40 mg Oral BID AC  . polyethylene glycol  17 g Oral Daily  . rOPINIRole  1 mg Oral Daily  . rOPINIRole  2 mg Oral TID  . traZODone  50 mg Oral QHS  . warfarin  5 mg Oral ONCE-1600  . Warfarin - Pharmacist Dosing Inpatient   Does not apply q1600   Infusions:  . heparin 1,450 Units/hr (09/21/20 0412)   PRN: acetaminophen **OR** acetaminophen, bisacodyl, morphine injection, ondansetron **OR** ondansetron (ZOFRAN) IV Anti-infectives (From admission, onward)   None      Assessment: Pharmacy was consulted to start heparin and restart warfarin for mAVR and afib. INR is currently @1 .4. Most recent HL was 0.34.  Hgb 9.7, Plt 220.  Provider wants to continue heparin bridge until INR is therapeutic. Pt has an hx of GI bleeds. GI cleared patient to restart anticoagulation. NO major DDI with warfarin.   Home dose warfarin:  warfarin 2.5 mg MTuThFSaSu and  3.75 mg on Wednesday  Date INR Warfarin Dose  10/20 1.4 baseline  10/25 --  2.5 mg   10/26 1.2 4 mg  10/27 1.2 4 mg  10/28 1.4 5 mg   Goal of Therapy:  INR 2.5 to 3.5 and Heparin level 0.3 to 0.7 Monitor platelets by anticoagulation protocol: Yes   Plan:  Heparin level is currently therapeutic but on the lower end of goal. Will increase heparin to 1500 units/hr to ensure it stays therapeutic. Will work heparin and CBC with AM labs.   INR still subtherapeutic @ 1.4. Pt received warfarin 4 mg for past 2 days.  Will increase dose by 25% and give warfarin 5 mg x 1 tonight to help get INR therapeutic. Daily INR ordered.   Bridging warfarin with heparin until INR is therapeutic and then can d/c heparin infusion.   Plan is to have INR therapeutic prior to discharge per MD.   Ronnald Ramp, PharmD, BCPS 09/21/2020,1:07 PM

## 2020-09-22 ENCOUNTER — Encounter: Payer: Self-pay | Admitting: Gastroenterology

## 2020-09-22 ENCOUNTER — Inpatient Hospital Stay (HOSPITAL_COMMUNITY)
Admit: 2020-09-22 | Discharge: 2020-09-22 | Disposition: A | Discharge status: Home / Self Care | Payer: Medicare Other | Attending: Pulmonary Disease | Admitting: Pulmonary Disease

## 2020-09-22 DIAGNOSIS — J9601 Acute respiratory failure with hypoxia: Secondary | ICD-10-CM | POA: Diagnosis not present

## 2020-09-22 DIAGNOSIS — K922 Gastrointestinal hemorrhage, unspecified: Secondary | ICD-10-CM

## 2020-09-22 DIAGNOSIS — N179 Acute kidney failure, unspecified: Secondary | ICD-10-CM | POA: Diagnosis not present

## 2020-09-22 DIAGNOSIS — I361 Nonrheumatic tricuspid (valve) insufficiency: Secondary | ICD-10-CM

## 2020-09-22 DIAGNOSIS — I340 Nonrheumatic mitral (valve) insufficiency: Secondary | ICD-10-CM

## 2020-09-22 DIAGNOSIS — R509 Fever, unspecified: Secondary | ICD-10-CM | POA: Diagnosis not present

## 2020-09-22 LAB — CBC
HCT: 32 % — ABNORMAL LOW (ref 39.0–52.0)
Hemoglobin: 9.9 g/dL — ABNORMAL LOW (ref 13.0–17.0)
MCH: 28.6 pg (ref 26.0–34.0)
MCHC: 30.9 g/dL (ref 30.0–36.0)
MCV: 92.5 fL (ref 80.0–100.0)
Platelets: 239 10*3/uL (ref 150–400)
RBC: 3.46 MIL/uL — ABNORMAL LOW (ref 4.22–5.81)
RDW: 17.2 % — ABNORMAL HIGH (ref 11.5–15.5)
WBC: 21.8 10*3/uL — ABNORMAL HIGH (ref 4.0–10.5)
nRBC: 0 % (ref 0.0–0.2)

## 2020-09-22 LAB — PROTIME-INR
INR: 1.7 — ABNORMAL HIGH (ref 0.8–1.2)
Prothrombin Time: 19.5 seconds — ABNORMAL HIGH (ref 11.4–15.2)

## 2020-09-22 LAB — LACTIC ACID, PLASMA
Lactic Acid, Venous: 3.6 mmol/L (ref 0.5–1.9)
Lactic Acid, Venous: 2.2 mmol/L (ref 0.5–1.9)

## 2020-09-22 LAB — PROCALCITONIN: Procalcitonin: 15.34 ng/mL

## 2020-09-22 LAB — ECHOCARDIOGRAM COMPLETE
AR max vel: 1.57 cm2
AV Area VTI: 1.32 cm2
AV Area mean vel: 1.41 cm2
AV Mean grad: 5 mmHg
AV Peak grad: 10.2 mmHg
Ao pk vel: 1.6 m/s
Area-P 1/2: 2.34 cm2
Height: 71 in
S' Lateral: 2.62 cm
Weight: 3509.72 oz

## 2020-09-22 LAB — CREATININE, SERUM
Creatinine, Ser: 1.56 mg/dL — ABNORMAL HIGH (ref 0.61–1.24)
GFR, Estimated: 43 mL/min — ABNORMAL LOW (ref >60)

## 2020-09-22 LAB — URINALYSIS, COMPLETE (UACMP) WITH MICROSCOPIC
Bilirubin Urine: NEGATIVE
Glucose, UA: NEGATIVE mg/dL
Ketones, ur: NEGATIVE mg/dL
Nitrite: NEGATIVE
Protein, ur: 100 mg/dL — AB
RBC / HPF: >50 RBC/hpf — ABNORMAL HIGH (ref 0–5)
Specific Gravity, Urine: 1.019 (ref 1.005–1.030)
pH: 5 (ref 5.0–8.0)

## 2020-09-22 LAB — HEPARIN LEVEL (UNFRACTIONATED): Heparin Unfractionated: <0.1 IU/mL — ABNORMAL LOW (ref 0.30–0.70)

## 2020-09-22 MED ORDER — SODIUM CHLORIDE 0.9 % IV SOLN
2.0000 g | Freq: Two times a day (BID) | INTRAVENOUS | Status: DC
  Administered 2020-09-22 – 2020-09-23 (×3): 2 g via INTRAVENOUS
  Filled 2020-09-22 (×5): qty 2

## 2020-09-22 MED ORDER — IPRATROPIUM-ALBUTEROL 0.5-2.5 (3) MG/3ML IN SOLN
3.0000 mL | Freq: Two times a day (BID) | RESPIRATORY_TRACT | Status: DC
  Administered 2020-09-22 – 2020-09-24 (×4): 3 mL via RESPIRATORY_TRACT
  Filled 2020-09-22 (×4): qty 3

## 2020-09-22 MED ORDER — IPRATROPIUM-ALBUTEROL 0.5-2.5 (3) MG/3ML IN SOLN
3.0000 mL | RESPIRATORY_TRACT | Status: DC
  Administered 2020-09-22: 3 mL via RESPIRATORY_TRACT
  Filled 2020-09-22: qty 3

## 2020-09-22 MED ORDER — WARFARIN SODIUM 4 MG PO TABS
4.0000 mg | ORAL_TABLET | Freq: Once | ORAL | Status: AC
  Administered 2020-09-22: 4 mg via ORAL
  Filled 2020-09-22: qty 1

## 2020-09-22 MED ORDER — WARFARIN - PHARMACIST DOSING INPATIENT: Freq: Every day | Status: DC

## 2020-09-22 MED ORDER — LIDOCAINE 5 % EX PTCH
1.0000 | MEDICATED_PATCH | CUTANEOUS | Status: DC
  Administered 2020-09-22 – 2020-09-26 (×5): 1 via TRANSDERMAL
  Filled 2020-09-22 (×7): qty 1

## 2020-09-22 MED ORDER — VANCOMYCIN HCL 1250 MG/250ML IV SOLN
1250.0000 mg | INTRAVENOUS | Status: DC
  Filled 2020-09-22: qty 250

## 2020-09-22 NOTE — Progress Notes (Signed)
*  PRELIMINARY RESULTS* Echocardiogram 2D Echocardiogram has been performed.  [name] 09/22/2020, 9:51 AM

## 2020-09-22 NOTE — Consult Note (Signed)
ANTICOAGULATION CONSULT NOTE   Pharmacy Consult for Heparin and warfarin Indication: atrial fibrillation and mAVR  Allergies  Allergen Reactions  . Sulfa Antibiotics Other (See Comments)    Reaction:  Unknown   . Levofloxacin Nausea Only    Patient Measurements: Height: 5\' 11"  (180.3 cm) Weight: 99.5 kg (219 lb 5.7 oz) IBW/kg (Calculated) : 75.3 Heparin Dosing Weight: 94.9 kg  Vital Signs: Temp: 98.3 F (36.8 C) (10/29 0800) Temp Source: Oral (10/29 0800) BP: 110/65 (10/29 1100) Pulse Rate: 78 (10/29 1100)  Labs: Recent Labs     0000 09/20/20 0629 09/20/20 1344 09/20/20 2139 09/21/20 0351 09/21/20 0351 09/21/20 1215 09/21/20 1916 09/21/20 2136 09/22/20 0504  HGB   < > 9.6*  --   --  9.7*   < >  --  10.3*  --  9.9*  HCT   < > 30.0*  --   --  30.5*  --   --  31.8*  --  32.0*  PLT   < > 211  --   --  220  --   --  259  --  239  APTT  --   --  124*  --   --   --   --   --   --   --   LABPROT  --  15.0  --   --  16.3*  --   --   --   --  19.5*  INR  --  1.2  --   --  1.4*  --   --   --   --  1.7*  HEPARINUNFRC  --   --   --    < > 0.34  --  0.30  --   --  <0.10*  CREATININE  --  0.85  --   --   --   --   --   --  1.14 1.56*   < > = values in this interval not displayed.    Estimated Creatinine Clearance: 40.1 mL/min (A) (by C-G formula based on SCr of 1.56 mg/dL (H)).   Medical History: Past Medical History:  Diagnosis Date  . Anxiety 10/11  . Aortic stenosis    a. s/p mechcanical AVR, 2002; b. 10/2018 Echo: Triv AI, mean grad 11/2018.  . Bradycardia    chronic, no symptoms 07/2010  . C. difficile colitis   . Carotid bruit    dopplers in past, no abnormalities  . Chronic diastolic CHF (congestive heart failure) (HCC)    a. Echo 03/2015: EF 60-65%, no RWMA, GR1DD, mild BAE, mild to mod MR, mod TR, PASP 65 mmHg; b. 01/2017 Echo: EF 55-60%, NRWMA, grade 1 diastolic dysfunction.  Normal functioning prosthetic aortic valve.  Mean gradient 50 mmHg.  Sev TR. PASP  02/2017; c. 10/2018 Echo: EF 55-60%, Triv AI, mod dil LA, mod-sev TR, PASP 35-40, mild to mod red RV fxn.  . Coronary artery disease    a. mild, cath, 08/2010; b. medically managed  . Decreased hearing    Right ear  . Depression   . Gastric ulcer   . GERD (gastroesophageal reflux disease)   . Hypertension    BP higher than usual 04/19/10; amlodipine increased by telephone  . Mod-Sev Tricuspid regurgitation    a. 10/2018 Echo: Mod-Sev TR, PASP 35-51mmHg.  43m RLS (restless legs syndrome) 08/23/2015  . S/P AVR    a. St. Jude. mechanical 2002; b. echo 08/2010 EF 60%, trival AI, mild MR, AVR working well; c.  on longterm warfarin tx  . SOB (shortness of breath) 10/11   08/2010,Episodes at 5 AM, eventually felt to be anxiety, after complete workup including catheterization, pt greatly improved with anxiety meds 11/11    Assessment: Pharmacy was consulted to start heparin and restart warfarin for mAVR and afib. INR 1.4 on admission. Hgb 9.7, Plt 220.  Provider wants to continue heparin bridge until INR is therapeutic. Pt has history of GI bleeds. GI cleared patient to restart anticoagulation. NO major DDI with warfarin.   Home dose warfarin:  warfarin 2.5 mg MTuThFSaSu and 3.75 mg on Wednesday  Date INR Warfarin Dose  10/20 1.4 baseline  10/25 -- 2.5 mg   10/26 1.2 4 mg  10/27 1.2 4 mg  10/28 1.4 5 mg  10/29 1.7    Goal of Therapy:  INR 2.5 to 3.5 and Heparin level 0.3 to 0.7 Monitor platelets by anticoagulation protocol: Yes   Plan:  Anticoagulation discontinued 10/28 after transfer to ICU and concern for worsening mental status with plan for [facility] to r/o I[facility]. Patient mental status improving and CTH cancelled. Discussed patient on rounds 10/29. Okay to restart heparin and warfarin at this time.  Heparin drip to restart at previous rate of 1500 units/hr (no initial bolus per CCMD). Will check HL with morning labs as patient was previously therapeutic on heparin at this rate.  INR starting  to trend up. Difficult to assess whether patient was therapeutic on home regimen prior to discontinuation for GIB. Will order warfarin 4 mg for today.  Bridging warfarin with heparin until INR is therapeutic and then can d/c heparin infusion.   Plan is to have INR therapeutic prior to discharge per MD.   Pricilla Riffle, PharmD 09/22/2020,12:25 PM

## 2020-09-22 NOTE — Progress Notes (Signed)
Physical Therapy Treatment Patient Details Name: [name] MRN: [id number] DOB: 1931-04-01 Today's Date: 09/22/2020    History of Present Illness Patient is an 86 year old male who presented to ED for weakness and low hemoglobin; admitted for management of GIB with symptomatic anemia. PMh includes aortic stenosis s/p mechanical AVR on coumadin, chronic diastolic HF, HTN, CAD, chronic venous stasis, chronic anemia, GERD, C diff, GI hemorrhage (sept 2021), anxiety, bradycardia, hearin glost, RLS. Rapid response called on 10/28.  Patient was confused, tachycardic, hypertensive and hypoxemic.  Place on HFCL then BiPAP for increased work of breathing. He devlovoped a fever with temperature of 101.7 and was transfered to the ICU.     PT Comments    Due to change in status, continued transfer referral in place with POC staying the same. Pt presents in bed on arrival to room with nursing talking to pt's daughter. Pt is agreeable to bed level exercises due to fatigue and pain. Pt performed LE exercises, and notes pain in L lower leg due to skin hurting. Patient's skin looks dry and cracked in L LE but no other abnormalities noted in area of pain. During R shoulder flexion, patient notes soreness and grimaces with movement . When attempted grip strength, patient notes pain in L hand and when asked what happened, pt notes he's not sure but it may have gotten scratched. No abnormalities observed in area of pain. Pt is repositioned with pillows under his lower legs to allow for elevated heels. Pt encouraged to continue exercises throughout her day. Pt left in bed with all needs and daughter bedside. Due to change in status, pt is not progressing towards goals, but continues to benefit from skilled PT services to maximize return to PLOF and minimize risk of future falls, injury, caregiver burden, and readmission. Will continue to follow POC. Discharge recommendation remains appropriate.   Follow Up  Recommendations  SNF     Equipment Recommendations  Rolling walker with 5" wheels    Recommendations for Other Services       Precautions / Restrictions Precautions Precautions: Fall Restrictions Weight Bearing Restrictions: No    Mobility  Bed Mobility               General bed mobility comments: Did not perform due to patient declining  Transfers                 General transfer comment: Did not perform due to patient declining  Ambulation/Gait             General Gait Details: Did not perform due to safety   Stairs             Wheelchair Mobility    Modified Rankin (Stroke Patients Only)       Balance       Sitting balance - Comments: Did not assess due to safety       Standing balance comment: Did not assess due to safety                            Cognition Arousal/Alertness: Lethargic Behavior During Therapy: Flat affect;Anxious Overall Cognitive Status: Difficult to assess         General Comments: Pt has eyes closed throughout session. He opens them when doing exercises but closes them immediately. Pt seems a little anxious and demonstrates flat affect. Patient is HOH as his hearing aides are not charged  Exercises General Exercises - Upper Extremity Shoulder Flexion: AROM;Both;Supine;Other reps (comment) (2 reps) General Exercises - Lower Extremity Hip ABduction/ADduction: AROM;Both;10 reps;Supine Straight Leg Raises: AROM;Both;10 reps;Supine Other Exercises Other Exercises: Attempted grip strength, but pt notes pain in L hand. He's not sure what happened to it but thinks it may have gotten scratched. No abnormalities observed in area of pain    General Comments General comments (skin integrity, edema, etc.): Patient notes pain in L hand and L lower leg due to skin integrity. Patient's L lower leg's skin looks dry and cracked but did not observe any skin breakdown where pt describes pain in L hand       Pertinent Vitals/Pain Pain Assessment: Faces Faces Pain Scale: Hurts little more Pain Location: R arm soreness, L hand and L ankle skin dryness Pain Descriptors / Indicators: Sore;Grimacing Pain Intervention(s): Limited activity within patient's tolerance;Repositioned          PT Goals (current goals can now be found in the care plan section) Acute Rehab PT Goals Patient Stated Goal: To return home  PT Goal Formulation: With patient Time For Goal Achievement: 09/30/20 Potential to Achieve Goals: Poor Progress towards PT goals: Not progressing toward goals - comment (Only able to perform bed level exercises 2/2change of status)    Frequency    Min 2X/week      PT Plan Current plan remains appropriate    Co-evaluation              AM-PAC PT "6 Clicks" Mobility   Outcome Measure  Help needed turning from your back to your side while in a flat bed without using bedrails?: Total Help needed moving from lying on your back to sitting on the side of a flat bed without using bedrails?: Total Help needed moving to and from a bed to a chair (including a wheelchair)?: Total Help needed standing up from a chair using your arms (e.g., wheelchair or bedside chair)?: Total Help needed to walk in hospital room?: Total Help needed climbing 3-5 steps with a railing? : Total 6 Click Score: 6    End of Session Equipment Utilized During Treatment: Oxygen Activity Tolerance: Patient limited by fatigue;Patient limited by lethargy;Patient limited by pain Patient left: in bed;with call bell/phone within reach;with bed alarm set;with family/visitor present Nurse Communication: Mobility status PT Visit Diagnosis: Muscle weakness (generalized) (M62.81);Difficulty in walking, not elsewhere classified (R26.2)     Time: 1610-9604 PT Time Calculation (min) (ACUTE ONLY): 12 min  Charges:                        Katherine Basset, SPT   Baker Pierini 09/22/2020, 10:31 AM

## 2020-09-22 NOTE — Progress Notes (Signed)
CRITICAL CARE NOTE 86 y.o. male with medical history significant for aortic stenosis status post mechanical aortic valve replacement on Coumadin, permanent atrial fibrillation, chronic diastolic CHF, hypertension, CAD, chronic venous stasis, chronic anemia, GERD, history of C. difficile infection, recent GI bleed in September 2021) severe anemia with hemoglobin of 4.  He was referred to the emergency room at the behest of his primary care physician because of generalized weakness, lightheadedness and anemia with hemoglobin of 7.4. He was admitted to the hospital for acute blood loss anemia likely from GI bleed. Patient was transferred to the ICU last night yesterday evening because of fever, hypoxia and change in mental status.  He feels better this morning. CC  follow up sepsis/resp failure  SUBJECTIVE Patient remains critically ill Feels better this am, still slightly lethargic but appropiate + sob + cough ACE inhibitor therapy was not prescribed due to .No  Sig sputum . Also c/o R scapular pain  No n/v/d.no Chest pain. No hemoptysis.  No headaches/seizures. No rashes/joint pain        SIGNIFICANT EVENTS  Asa bove  BP 125/68   Pulse 78   Temp (!) 97.4 F (36.3 C) (Axillary)   Resp 20   Ht 5\' 11"  (1.803 m)   Wt 99.5 kg   SpO2 98%   BMI 30.59 kg/m    REVIEW OF SYSTEMS As aove  PHYSICAL EXAMINATION:  GENERAL:critically ill appearing, no resp distress HEAD: Normocephalic, atraumatic.  EYES: Pupils equal, round, reactive to light.  No scleral icterus.  MOUTH: Moist mucosal membrane. NECK: Supple. No thyromegaly. No nodules. No JVD.  PULMONARY: few rhonci, no wheezes CARDIOVASCULAR: S1 and S2.Mettalic A2,   No murmurs, rubs, or gallops.  GASTROINTESTINAL: Soft, nontender, -distended. No masses. Positive bowel sounds. No hepatosplenomegaly.  MUSCULOSKELETAL: No swelling, clubbing, +Trace  edema.  NEUROLOGIC: awake and alert, appropiate, moves all extremities and follows  commands SKIN:intact,warm,dry  INTAKE/OUTPUT  Intake/Output Summary (Last 24 hours) at 09/22/2020 1547 Last data filed at 09/22/2020 1433 Gross per 24 hour  Intake 2028.04 ml  Output 276 ml  Net 1752.04 ml    LABS  CBC Recent Labs  Lab 09/21/20 0351 09/21/20 1916 09/22/20 0504  WBC 5.6 16.5* 21.8*  HGB 9.7* 10.3* 9.9*  HCT 30.5* 31.8* 32.0*  PLT 220 259 239   Coag's Recent Labs  Lab 09/20/20 0629 09/20/20 1344 09/21/20 0351 09/22/20 0504  APTT  --  124*  --   --   INR 1.2  --  1.4* 1.7*   BMET Recent Labs  Lab 09/19/20 0532 09/19/20 0532 09/20/20 0629 09/21/20 2136 09/22/20 0504  NA 136  --  135 134*  --   K 3.8  --  4.0 4.2  --   CL 99  --  101 100  --   CO2 28  --  26 24  --   BUN 18  --  19 23  --   CREATININE 0.88   < > 0.85 1.14 1.56*  GLUCOSE 88  --  99 108*  --    < > = values in this interval not displayed.   Electrolytes Recent Labs  Lab 09/17/20 0517 09/17/20 0517 09/18/20 0607 09/18/20 0607 09/19/20 0532 09/20/20 0629 09/21/20 2136  CALCIUM 8.5*   < > 8.4*   < > 8.8* 8.8* 8.4*  MG 2.7*  --  2.4  --  2.5*  --   --    < > = values in this interval not displayed.  Sepsis Markers Recent Labs  Lab 09/21/20 1916 09/22/20 0504  PROCALCITON 0.38 15.34   ABG Recent Labs  Lab 09/21/20 1800  PHART 7.48*  PCO2ART 33  PO2ART 374*   Liver Enzymes No results for input(s): AST, ALT, ALKPHOS, BILITOT, ALBUMIN in the last 168 hours. Cardiac Enzymes No results for input(s): TROPONINI, PROBNP in the last 168 hours. Glucose Recent Labs  Lab 09/16/20 2133 09/21/20 1839  GLUCAP 131* 101*     Recent Results (from the past 240 hour(s))  Respiratory Panel by RT PCR (Flu A&B, Covid) - Nasopharyngeal Swab     Status: None   Collection Time: 09/13/20  5:55 PM   Specimen: Nasopharyngeal Swab  Result Value Ref Range Status   SARS Coronavirus 2 by RT PCR NEGATIVE NEGATIVE Final    Comment: (NOTE) SARS-CoV-2 target nucleic acids are NOT  DETECTED.  The SARS-CoV-2 RNA is generally detectable in upper respiratoy specimens during the acute phase of infection. The lowest concentration of SARS-CoV-2 viral copies this assay can detect is 131 copies/mL. A negative result does not preclude SARS-Cov-2 infection and should not be used as the sole basis for treatment or other patient management decisions. A negative result may occur with  improper specimen collection/handling, submission of specimen other than nasopharyngeal swab, presence of viral mutation(s) within the areas targeted by this assay, and inadequate number of viral copies (<131 copies/mL). A negative result must be combined with clinical observations, patient history, and epidemiological information. The expected result is Negative.  Fact Sheet for Patients:  https://www.moore.com/https://www.fda.gov/media/142436/download  Fact Sheet for Healthcare Providers:  https://www.young.biz/https://www.fda.gov/media/142435/download  This test is no t yet approved or cleared by the [place] FDA and  has been authorized for detection and/or diagnosis of SARS-CoV-2 by FDA under an Emergency Use Authorization (EUA). This EUA will remain  in effect (meaning this test can be used) for the duration of the COVID-19 declaration under Section 564(b)(1) of the Act, 21 U.S.C. section 360bbb-3(b)(1), unless the authorization is terminated or revoked sooner.     Influenza A by PCR NEGATIVE NEGATIVE Final   Influenza B by PCR NEGATIVE NEGATIVE Final    Comment: (NOTE) The Xpert Xpress SARS-CoV-2/FLU/RSV assay is intended as an aid in  the diagnosis of influenza from Nasopharyngeal swab specimens and  should not be used as a sole basis for treatment. Nasal washings and  aspirates are unacceptable for Xpert Xpress SARS-CoV-2/FLU/RSV  testing.  Fact Sheet for Patients: https://www.moore.com/https://www.fda.gov/media/142436/download  Fact Sheet for Healthcare Providers: https://www.young.biz/https://www.fda.gov/media/142435/download  This test is not yet  approved or cleared by the [place] FDA and  has been authorized for detection and/or diagnosis of SARS-CoV-2 by  FDA under an Emergency Use Authorization (EUA). This EUA will remain  in effect (meaning this test can be used) for the duration of the  Covid-19 declaration under Section 564(b)(1) of the Act, 21  U.S.C. section 360bbb-3(b)(1), unless the authorization is  terminated or revoked. Performed at [facility], [address] [place], [place] [id number]   MRSA PCR Screening     Status: None   Collection Time: 09/21/20  7:00 PM   Specimen: Nasopharyngeal  Result Value Ref Range Status   MRSA by PCR NEGATIVE NEGATIVE Final    Comment:        The GeneXpert MRSA Assay (FDA approved for NASAL specimens only), is one component of a comprehensive MRSA colonization surveillance program. It is not intended to diagnose MRSA infection nor to guide or monitor treatment for MRSA infections.  Performed at [facility], [address] [place], [place] [address]   CULTURE, BLOOD (ROUTINE X 2) w Reflex to ID Panel     Status: None (Preliminary result)   Collection Time: 09/21/20  9:36 PM   Specimen: BLOOD  Result Value Ref Range Status   Specimen Description BLOOD BLOOD LEFT HAND  Final   Special Requests   Final    BOTTLES DRAWN AEROBIC AND ANAEROBIC Blood Culture adequate volume   Culture   Final    NO GROWTH < 12 HOURS Performed at [facility], [address] [place], [place] [address]    Report Status PENDING  Incomplete  CULTURE, BLOOD (ROUTINE X 2) w Reflex to ID Panel     Status: None (Preliminary result)   Collection Time: 09/21/20  9:36 PM   Specimen: BLOOD  Result Value Ref Range Status   Specimen Description BLOOD LEFT ANTECUBITAL  Final   Special Requests   Final    BOTTLES DRAWN AEROBIC ONLY Blood Culture results may not be optimal due to an inadequate volume of blood received in culture bottles   Culture   Final    NO GROWTH < 12  HOURS Performed at [facility], [address] [place], [place] [address]    Report Status PENDING  Incomplete    MEDICATIONS   Current Facility-Administered Medications:  .  0.9 %  sodium chloride infusion, 250 mL, Intravenous, Continuous, Elyn Aquas, MD .  acetaminophen (TYLENOL) tablet 650 mg, 650 mg, Oral, Q6H PRN, 650 mg at 09/22/20 0509 **OR** acetaminophen (TYLENOL) suppository 650 mg, 650 mg, Rectal, Q6H PRN, Lurene Shadow, MD .  allopurinol (ZYLOPRIM) tablet 300 mg, 300 mg, Oral, Daily, Lurene Shadow, MD, 300 mg at 09/22/20 1055 .  ALPRAZolam Prudy Feeler) tablet 0.25 mg, 0.25 mg, Oral, Daily PRN, Lurene Shadow, MD, 0.25 mg at 09/21/20 1552 .  bisacodyl (DULCOLAX) suppository 10 mg, 10 mg, Rectal, Daily PRN, Lurene Shadow, MD .  busPIRone (BUSPAR) tablet 5 mg, 5 mg, Oral, BID, Lurene Shadow, MD, 5 mg at 09/22/20 1057 .  ceFEPIme (MAXIPIME) 2 g in sodium chloride 0.9 % 100 mL IVPB, 2 g, Intravenous, Q12H, Elyn Aquas, MD .  chlorhexidine (PERIDEX) 0.12 % solution 15 mL, 15 mL, Mouth Rinse, BID, Lurene Shadow, MD, 15 mL at 09/22/20 1056 .  Chlorhexidine Gluconate Cloth 2 % PADS 6 each, 6 each, Topical, Daily, Lurene Shadow, MD, 6 each at 09/21/20 2000 .  docusate sodium (COLACE) capsule 200 mg, 200 mg, Oral, BID, Lurene Shadow, MD, 200 mg at 09/22/20 1056 .  DULoxetine (CYMBALTA) DR capsule 30 mg, 30 mg, Oral, Daily, Lurene Shadow, MD, 30 mg at 09/22/20 1056 .  finasteride (PROSCAR) tablet 5 mg, 5 mg, Oral, Daily, Lurene Shadow, MD, 5 mg at 09/22/20 1056 .  [COMPLETED] heparin bolus via infusion 2,400 Units, 2,400 Units, Intravenous, Once, 2,400 Units at 09/20/20 1238 **FOLLOWED BY** heparin ADULT infusion 100 units/mL (25000 units/280mL sodium chloride 0.45%), 1,500 Units/hr, Intravenous, Continuous, Lurene Shadow, MD, Last Rate: 15 mL/hr at 09/22/20 1235, 1,500 Units/hr at 09/22/20 1235 .  ipratropium-albuterol (DUONEB) 0.5-2.5 (3) MG/3ML nebulizer solution 3  mL, 3 mL, Nebulization, Q4H, Elyn Aquas, MD, 3 mL at 09/22/20 1524 .  lidocaine (LIDODERM) 5 % 1 patch, 1 patch, Transdermal, Q24H, Elyn Aquas, MD, 1 patch at 09/22/20 0805 .  MEDLINE mouth rinse, 15 mL, Mouth Rinse, q12n4p, Lurene Shadow, MD .  metoprolol succinate (TOPROL-XL) 24 hr tablet 12.5 mg, 12.5 mg, Oral, Daily, [place],  Adela Glimpse, MD, 12.5 mg at 09/22/20 1112 .  morphine 2 MG/ML injection 2 mg, 2 mg, Intravenous, Q2H PRN, Lurene Shadow, MD .  norepinephrine (LEVOPHED) 4mg  in premix infusion, 2-10 mcg/min, Intravenous, Titrated, , MD, Stopped at 09/22/20 1030 .  ondansetron (ZOFRAN) tablet 4 mg, 4 mg, Oral, Q6H PRN **OR** ondansetron (ZOFRAN) injection 4 mg, 4 mg, Intravenous, Q6H PRN, 09/24/20, MD .  pantoprazole (PROTONIX) EC tablet 40 mg, 40 mg, Oral, BID AC, Lurene Shadow, MD, 40 mg at 09/22/20 0805 .  polyethylene glycol (MIRALAX / GLYCOLAX) packet 17 g, 17 g, Oral, Daily, 09/24/20, MD, 17 g at 09/21/20 0947 .  rOPINIRole (REQUIP) tablet 1 mg, 1 mg, Oral, Daily, 09/23/20, MD, 1 mg at 09/21/20 0209 .  rOPINIRole (REQUIP) tablet 2 mg, 2 mg, Oral, TID, 0210, MD, 2 mg at 09/22/20 1507 .  traZODone (DESYREL) tablet 50 mg, 50 mg, Oral, QHS, 09/24/20, MD, 50 mg at 09/20/20 2103 .  [START ON 09/23/2020] vancomycin (VANCOREADY) IVPB 1250 mg/250 mL, 1,250 mg, Intravenous, Q24H, 09/25/2020, MD .  Warfarin - Pharmacist Dosing Inpatient, , Does not apply, q1600, Elyn Aquas, MD      Indwelling Urinary Catheter continued, requirement due to   Reason to continue Indwelling Urinary Catheter for strict Intake/Output monitoring for hemodynamic instability   continued, requirement due to   Monitoring of central venous pressure or other hemodynamic parameters   continued, requirement due to, resp failure     ASSESSMENT AND PLAN SYNOPSIS  1.Sepsis unclear sourse, likely aspiration pneumonia, follow blood c/s, off  pressors. BP holding  elevated pro cal, cont vanco/cefepime CHECK LACTIC ACIS  2.Aspiration pneumonia cxr suggestive of R aspiration pneumonia . Vanco/cefepime as above  3. Acute on chronic diastolic heart failure hold diuretics for now due to borderline BP and sepsis. revieved lasix last night, follow BNP  4. Acute blood loss anemia secondary to GI bleeding s/p capsule endoscopy on 09/16/2019 which showed copious amount of coffee-ground liquid in the entire small bowel and likely small bowel evaluations.  S/p small bowel enteroscopy on 09/17/2020.  This showed nonbleeding duodenal ulcers with a clean ulcer base and "recent bleeding erosive to adenopathy treated with argon beam coagulation.  S/p blood transfusion with 1 unit of PRBCs on 09/13/2020. Continue Protonix.  H&H is stable.  ACUTE RESP FAILURE  -continueO2 AS NEEDED ON 2-4 LO2 BY [facility]iley -continue Bronchodilator Therapy -Wean Fio2  as tolerated  AKI SEPSIS RELATED FOLLOW RENAL FNX   PERMANENT AFIB/, status post mechanical aortic valve replacement:  INR still subtherapeutic at 1.7.  Continue IV heparin drip and monitor heparin level per protocol.  Continue Coumadin and monitor INR.  CAD: Continue aspirin, Coumadin and metoprolol  Anxiety: Continue BuSpar.  Xanax as needed.  GI/Nutrition, ON DIET  GI PROPHYLAXIS ON ppi  Constipation protocol as indicated   ELECTROLYTES -follow labs as needed -replace as needed -    ASSESS the need for LABS as needed   Critical Care Time devoted to patient care services described in this note is 55 minutes.   Overall, patient is critically ill, prognosis is guarded. GETTING BETTER. D/W STAFF AND IN multidisciplinary rounds. Discussed with daughter who was present at his bedside  09/15/2020, MD  09/22/2020 3:47 PM 09/24/2020 Pulmonary & Critical Care Medicine

## 2020-09-22 NOTE — Progress Notes (Addendum)
Progress Note    [name]  UEA:[id number] DOB: 11-Apr-1931  DOA: 09/13/2020 PCP: Gracelyn Nurse, MD      Brief Narrative:    Medical records reviewed and are as summarized below:  [name] is a 86 y.o. male with medical history significant for aortic stenosis status post mechanical aortic valve replacement on Coumadin, permanent atrial fibrillation, chronic diastolic CHF, hypertension, CAD, chronic venous stasis, chronic anemia, GERD, history of C. difficile infection, recent GI bleed in September 2021) severe anemia with hemoglobin of 4.  He was referred to the emergency room at the behest of his primary care physician because of generalized weakness, lightheadedness and anemia with hemoglobin of 7.4.  He was admitted to the hospital for acute blood loss anemia likely from GI bleed.    Assessment/Plan:   Principal Problem:   Upper GI bleeding Active Problems:   Hypertension   Coronary artery disease   S/P AVR   Warfarin anticoagulation   History of mechanical aortic valve replacement   Chronic diastolic CHF (congestive heart failure) (HCC)   AKI (acute kidney injury) (HCC)   Permanent atrial fibrillation (HCC)   Acute blood loss anemia   History of lower GI bleeding   Symptomatic anemia   Fever   Acute respiratory failure with hypoxia (HCC)   Body mass index is 30.59 kg/m.  (Obesity)  Recent fever with T-max of 101.7, suspected sepsis, leukocytosis: Continue empiric IV antibiotics.  Blood culture is pending.  Acute kidney injury, recent hypotension: BP has improved but BP still soft.  Lasix has been held.  Acute exacerbation of chronic diastolic CHF: He got IV Lasix on 09/21/2020.  However, Lasix has been held because of worsening kidney function.  Monitor cardiac and respiratory status closely.  2D echo is pending.  Acute hypoxic respiratory failure: He is off of BiPAP.  He is tolerating 4 L/min oxygen via nasal cannula.  Taper off  oxygen as able.  Altered mental status/delirium: Improved.  Continue supportive care  Acute blood loss anemia secondary to GI bleeding: s/p capsule endoscopy on 09/16/2019 which showed copious amount of coffee-ground liquid in the entire small bowel and likely small bowel evaluations.  S/p small bowel enteroscopy on 09/17/2020.  This showed nonbleeding duodenal ulcers with a clean ulcer base and "recent bleeding erosive to adenopathy treated with argon beam coagulation.  S/p blood transfusion with 1 unit of PRBCs on 09/13/2020. Continue Protonix.  H&H is stable.  Permanent atrial fibrillation, status post mechanical aortic valve replacement: INR still subtherapeutic at 1.7.  Continue IV heparin drip and monitor heparin level per protocol.  Continue Coumadin and monitor INR.  CAD: Continue aspirin, Coumadin and metoprolol  Anxiety: Continue BuSpar.  Xanax as needed.   Diet Order            Diet Heart Room service appropriate? Yes; Fluid consistency: Thin  Diet effective now                    Consultants:  Gastroenterologist  Cardiologist  Procedures:  Small bowel endoscopy    Medications:   . allopurinol  300 mg Oral Daily  . busPIRone  5 mg Oral BID  . chlorhexidine  15 mL Mouth Rinse BID  . Chlorhexidine Gluconate Cloth  6 each Topical Daily  . docusate sodium  200 mg Oral BID  . DULoxetine  30 mg Oral Daily  . finasteride  5 mg Oral Daily  . ipratropium-albuterol  3 mL  Nebulization Q4H  . lidocaine  1 patch Transdermal Q24H  . mouth rinse  15 mL Mouth Rinse q12n4p  . metoprolol succinate  12.5 mg Oral Daily  . pantoprazole  40 mg Oral BID AC  . polyethylene glycol  17 g Oral Daily  . rOPINIRole  1 mg Oral Daily  . rOPINIRole  2 mg Oral TID  . traZODone  50 mg Oral QHS  . Warfarin - Pharmacist Dosing Inpatient   Does not apply q1600   Continuous Infusions: . sodium chloride    . ceFEPime (MAXIPIME) IV Stopped (09/22/20 0540)  . heparin Stopped (09/21/20  1700)  . norepinephrine (LEVOPHED) Adult infusion 1 mcg/min (09/22/20 0903)  . vancomycin 1,250 mg (09/22/20 3664)     Anti-infectives (From admission, onward)   Start     Dose/Rate Route Frequency Ordered Stop   09/22/20 0800  vancomycin (VANCOREADY) IVPB 1250 mg/250 mL        1,250 mg 166.7 mL/hr over 90 Minutes Intravenous Every 12 hours 09/21/20 1918     09/21/20 2200  ceFEPIme (MAXIPIME) 1 g in sodium chloride 0.9 % 100 mL IVPB        1 g 200 mL/hr over 30 Minutes Intravenous Every 8 hours 09/21/20 1905     09/21/20 2015  vancomycin (VANCOREADY) IVPB 2000 mg/400 mL        2,000 mg 200 mL/hr over 120 Minutes Intravenous  Once 09/21/20 1918 09/21/20 2319             Family Communication/Anticipated D/C date and plan/Code Status   DVT prophylaxis: SCDs Start: 09/13/20 1948     Code Status: Full Code  Family Communication:  Disposition Plan:    Status is: Inpatient  Remains inpatient appropriate because:IV treatments appropriate due to intensity of illness or inability to take PO and Mechanical aortic valve with subtherapeutic INR   Dispo: The patient is from: Home              Anticipated d/c is to: SNF              Anticipated d/c date is: 2 days              Patient currently is not medically stable to d/c.           Subjective:   Interval events noted.  Patient was transferred to the ICU last night yesterday evening because of fever, hypoxia and change in mental status.  He feels better this morning.   Objective:    Vitals:   09/22/20 0800 09/22/20 0900 09/22/20 1000 09/22/20 1100  BP: (!) 102/36 114/66 (!) 90/59 110/65  Pulse: 61 66 (!) 52 78  Resp: 15 20 (!) 23 (!) 21  Temp: 98.3 F (36.8 C)     TempSrc: Oral     SpO2: 94% 91% 96% 92%  Weight:      Height:       No data found.   Intake/Output Summary (Last 24 hours) at 09/22/2020 1224 Last data filed at 09/22/2020 1100 Gross per 24 hour  Intake 1047.91 ml  Output 276 ml  Net  771.91 ml   Filed Weights   09/18/20 0634 09/19/20 0641 09/21/20 0512  Weight: 95.9 kg 97.7 kg 99.5 kg    Exam:  GEN: NAD SKIN: Warm and dry EYES: EOMI ENT: MMM CV: RRR PULM: CTA B ABD: soft, obese, NT, +BS CNS: AAO x 3, non focal EXT: No edema or tenderness     Data  Reviewed:   Yesterday evening have personally reviewed following labs and imaging studies:  Labs: Labs show the following:   Basic Metabolic Panel: Recent Labs  Lab 09/16/20 0542 09/16/20 0542 09/17/20 0517 09/17/20 0517 09/18/20 0177 09/18/20 0607 09/19/20 0532 09/19/20 0532 09/20/20 0629 09/21/20 2136 09/22/20 0504  NA 135   < > 134*  --  135  --  136  --  135 134*  --   K 3.4*   < > 3.5   < > 3.7   < > 3.8   < > 4.0 4.2  --   CL 100   < > 100  --  100  --  99  --  101 100  --   CO2 26   < > 26  --  26  --  28  --  26 24  --   GLUCOSE 104*   < > 99  --  91  --  88  --  99 108*  --   BUN 25*   < > 17  --  16  --  18  --  19 23  --   CREATININE 0.91   < > 0.79   < > 0.81  --  0.88  --  0.85 1.14 1.56*  CALCIUM 8.6*   < > 8.5*  --  8.4*  --  8.8*  --  8.8* 8.4*  --   MG 2.9*  --  2.7*  --  2.4  --  2.5*  --   --   --   --    < > = values in this interval not displayed.   GFR Estimated Creatinine Clearance: 40.1 mL/min (A) (by C-G formula based on SCr of 1.56 mg/dL (H)). Liver Function Tests: No results for input(s): AST, ALT, ALKPHOS, BILITOT, PROT, ALBUMIN in the last 168 hours. No results for input(s): LIPASE, AMYLASE in the last 168 hours. No results for input(s): AMMONIA in the last 168 hours. Coagulation profile Recent Labs  Lab 09/19/20 0532 09/20/20 0629 09/21/20 0351 09/22/20 0504  INR 1.2 1.2 1.4* 1.7*    CBC: Recent Labs  Lab 09/19/20 0532 09/20/20 0629 09/21/20 0351 09/21/20 1916 09/22/20 0504  WBC 4.5 4.2 5.6 16.5* 21.8*  NEUTROABS  --   --   --  15.1*  --   HGB 9.0* 9.6* 9.7* 10.3* 9.9*  HCT 28.9* 30.0* 30.5* 31.8* 32.0*  MCV 92.0 92.9 91.6 90.1 92.5  PLT 209  211 220 259 239   Cardiac Enzymes: No results for input(s): CKTOTAL, CKMB, CKMBINDEX, TROPONINI in the last 168 hours. BNP (last 3 results) No results for input(s): PROBNP in the last 8760 hours. CBG: Recent Labs  Lab 09/16/20 2133 09/21/20 1839  GLUCAP 131* 101*   D-Dimer: No results for input(s): DDIMER in the last 72 hours. Hgb A1c: No results for input(s): HGBA1C in the last 72 hours. Lipid Profile: No results for input(s): CHOL, HDL, LDLCALC, TRIG, CHOLHDL, LDLDIRECT in the last 72 hours. Thyroid function studies: No results for input(s): TSH, T4TOTAL, T3FREE, THYROIDAB in the last 72 hours.  Invalid input(s): FREET3 Anemia work up: No results for input(s): VITAMINB12, FOLATE, FERRITIN, TIBC, IRON, RETICCTPCT in the last 72 hours. Sepsis Labs: Recent Labs  Lab 09/20/20 0629 09/21/20 0351 09/21/20 1916 09/22/20 0504  PROCALCITON  --   --  0.38 15.34  WBC 4.2 5.6 16.5* 21.8*    Microbiology Recent Results (from the past 240 hour(s))  Respiratory Panel by RT PCR (  Flu A&B, Covid) - Nasopharyngeal Swab     Status: None   Collection Time: 09/13/20  5:55 PM   Specimen: Nasopharyngeal Swab  Result Value Ref Range Status   SARS Coronavirus 2 by RT PCR NEGATIVE NEGATIVE Final    Comment: (NOTE) SARS-CoV-2 target nucleic acids are NOT DETECTED.  The SARS-CoV-2 RNA is generally detectable in upper respiratoy specimens during the acute phase of infection. The lowest concentration of SARS-CoV-2 viral copies this assay can detect is 131 copies/mL. A negative result does not preclude SARS-Cov-2 infection and should not be used as the sole basis for treatment or other patient management decisions. A negative result may occur with  improper specimen collection/handling, submission of specimen other than nasopharyngeal swab, presence of viral mutation(s) within the areas targeted by this assay, and inadequate number of viral copies (<131 copies/mL). A negative result must be  combined with clinical observations, patient history, and epidemiological information. The expected result is Negative.  Fact Sheet for Patients:  https://www.moore.com/  Fact Sheet for Healthcare Providers:  https://www.young.biz/  This test is no t yet approved or cleared by the [place] FDA and  has been authorized for detection and/or diagnosis of SARS-CoV-2 by FDA under an Emergency Use Authorization (EUA). This EUA will remain  in effect (meaning this test can be used) for the duration of the COVID-19 declaration under Section 564(b)(1) of the Act, 21 U.S.C. section 360bbb-3(b)(1), unless the authorization is terminated or revoked sooner.     Influenza A by PCR NEGATIVE NEGATIVE Final   Influenza B by PCR NEGATIVE NEGATIVE Final    Comment: (NOTE) The Xpert Xpress SARS-CoV-2/FLU/RSV assay is intended as an aid in  the diagnosis of influenza from Nasopharyngeal swab specimens and  should not be used as a sole basis for treatment. Nasal washings and  aspirates are unacceptable for Xpert Xpress SARS-CoV-2/FLU/RSV  testing.  Fact Sheet for Patients: https://www.moore.com/  Fact Sheet for Healthcare Providers: https://www.young.biz/  This test is not yet approved or cleared by the [place] FDA and  has been authorized for detection and/or diagnosis of SARS-CoV-2 by  FDA under an Emergency Use Authorization (EUA). This EUA will remain  in effect (meaning this test can be used) for the duration of the  Covid-19 declaration under Section 564(b)(1) of the Act, 21  U.S.C. section 360bbb-3(b)(1), unless the authorization is  terminated or revoked. Performed at [facility], [address] [place], [place] [address]   MRSA PCR Screening     Status: None   Collection Time: 09/21/20  7:00 PM   Specimen: Nasopharyngeal  Result Value Ref Range Status   MRSA by PCR NEGATIVE NEGATIVE  Final    Comment:        The GeneXpert MRSA Assay (FDA approved for NASAL specimens only), is one component of a comprehensive MRSA colonization surveillance program. It is not intended to diagnose MRSA infection nor to guide or monitor treatment for MRSA infections. Performed at [facility], [address] [place], [place] [address]   CULTURE, BLOOD (ROUTINE X 2) w Reflex to ID Panel     Status: None (Preliminary result)   Collection Time: 09/21/20  9:36 PM   Specimen: BLOOD  Result Value Ref Range Status   Specimen Description BLOOD BLOOD LEFT HAND  Final   Special Requests   Final    BOTTLES DRAWN AEROBIC AND ANAEROBIC Blood Culture adequate volume   Culture   Final    NO GROWTH < 12 HOURS  Performed at [facility], [address] [place], [place] [address]    Report Status PENDING  Incomplete  CULTURE, BLOOD (ROUTINE X 2) w Reflex to ID Panel     Status: None (Preliminary result)   Collection Time: 09/21/20  9:36 PM   Specimen: BLOOD  Result Value Ref Range Status   Specimen Description BLOOD LEFT ANTECUBITAL  Final   Special Requests   Final    BOTTLES DRAWN AEROBIC ONLY Blood Culture results may not be optimal due to an inadequate volume of blood received in culture bottles   Culture   Final    NO GROWTH < 12 HOURS Performed at [facility], [address] [place], [place] [address]    Report Status PENDING  Incomplete    Procedures and diagnostic studies:  DG Chest Port 1 View  Result Date: 09/21/2020 CLINICAL DATA:  86 year old male with hypoxia. EXAM: PORTABLE CHEST 1 VIEW COMPARISON:  Chest radiograph dated 09/16/2020. FINDINGS: There is stable cardiomegaly with vascular congestion and edema. Overall interval progression of the CHF compared to the prior radiograph. Small bilateral pleural effusions. Superimposed pneumonia is not excluded. No pneumothorax. Median sternotomy wires and mechanical heart valve. No acute osseous  pathology. IMPRESSION: Cardiomegaly with CHF and small bilateral pleural effusions, worsened since the prior radiograph. Electronically Signed   By: Elgie CollardArash  Radparvar M.D.   On: 09/21/2020 18:01               LOS: 9 days   Makenzie Vittorio  [facility]   Pager on [email or web address]. If 7PM-7AM, please contact night-coverage at [email or web address]     09/22/2020, 12:24 PM

## 2020-09-22 NOTE — Consult Note (Signed)
Pharmacy Antibiotic Note  [name] is a 86 y.o. male admitted on 09/13/2020. Pt transferred to ICU for worsening respiratory status and AMS, also with fever tmax 101.7. WBC trending up, PCT 0.38>15. Patient was started on broad spectrum antibiotics upon transfer to the ICU and cultures drawn. AMS improving 10/29. Original plan for [facility] cancelled s/t improvement. Per MD, continue antibiotics at this time. Pharmacy has been consulted for vancomycin dosing.  Today is day 2 of vancomycin and cefepime.  Plan: Change vancomycin to 1250 mg IV q24h for bump in renal function. Expect likely multifactorial with acute decompensation requiring transfer to ICU. Monitor renal function closely for any necessary antibiotic changes. Will attempt de-escalation as clinically appropriate.  Height: 5\' 11"  (180.3 cm) Weight: 99.5 kg (219 lb 5.7 oz) IBW/kg (Calculated) : 75.3  Temp (24hrs), Avg:98.8 F (37.1 C), Min:97.4 F (36.3 C), Max:101.7 F (38.7 C)  Recent Labs  Lab 09/18/20 0607 09/18/20 0653 09/19/20 0532 09/20/20 0629 09/21/20 0351 09/21/20 1916 09/21/20 2136 09/22/20 0504  WBC  --    < > 4.5 4.2 5.6 16.5*  --  21.8*  CREATININE 0.81  --  0.88 0.85  --   --  1.14 1.56*   < > = values in this interval not displayed.    Estimated Creatinine Clearance: 40.1 mL/min (A) (by C-G formula based on SCr of 1.56 mg/dL (H)).    Allergies  Allergen Reactions  . Sulfa Antibiotics Other (See Comments)    Reaction:  Unknown   . Levofloxacin Nausea Only    Antimicrobials this admission: 10/28 cefepime >>  10/28 vancomycin >>   Microbiology results: 10/28 BCx: NG 12 hr 10/28 UCx: pending 10/28 MRSA PCR: negative  Thank you for allowing pharmacy to be a part of this patient's care.  11/28, PharmD Clinical Pharmacist 09/22/2020 12:52 PM

## 2020-09-22 NOTE — Consult Note (Signed)
ANTICOAGULATION CONSULT NOTE   Pharmacy Consult for Heparin and warfarin Indication: atrial fibrillation and mAVR  Allergies  Allergen Reactions  . Sulfa Antibiotics Other (See Comments)    Reaction:  Unknown   . Levofloxacin Nausea Only    Patient Measurements: Height: 5\' 11"  (180.3 cm) Weight: 99.5 kg (219 lb 5.7 oz) IBW/kg (Calculated) : 75.3 Heparin Dosing Weight: 94.9 kg  Vital Signs: Temp: 99.9 F (37.7 C) (10/28 2000) Temp Source: Axillary (10/28 2000) BP: 90/56 (10/28 2300) Pulse Rate: 78 (10/28 2300)  Labs: Recent Labs     0000 09/20/20 0629 09/20/20 1344 09/20/20 2139 09/21/20 0351 09/21/20 0351 09/21/20 1215 09/21/20 1916 09/21/20 2136 09/22/20 0504  HGB   < > 9.6*  --   --  9.7*   < >  --  10.3*  --  9.9*  HCT   < > 30.0*  --   --  30.5*  --   --  31.8*  --  32.0*  PLT   < > 211  --   --  220  --   --  259  --  239  APTT  --   --  124*  --   --   --   --   --   --   --   LABPROT  --  15.0  --   --  16.3*  --   --   --   --  19.5*  INR  --  1.2  --   --  1.4*  --   --   --   --  1.7*  HEPARINUNFRC  --   --   --    < > 0.34  --  0.30  --   --  <0.10*  CREATININE  --  0.85  --   --   --   --   --   --  1.14 1.56*   < > = values in this interval not displayed.    Estimated Creatinine Clearance: 40.1 mL/min (A) (by C-G formula based on SCr of 1.56 mg/dL (H)).   Medical History: Past Medical History:  Diagnosis Date  . Anxiety 10/11  . Aortic stenosis    a. s/p mechcanical AVR, 2002; b. 10/2018 Echo: Triv AI, mean grad 11/2018.  . Bradycardia    chronic, no symptoms 07/2010  . C. difficile colitis   . Carotid bruit    dopplers in past, no abnormalities  . Chronic diastolic CHF (congestive heart failure) (HCC)    a. Echo 03/2015: EF 60-65%, no RWMA, GR1DD, mild BAE, mild to mod MR, mod TR, PASP 65 mmHg; b. 01/2017 Echo: EF 55-60%, NRWMA, grade 1 diastolic dysfunction.  Normal functioning prosthetic aortic valve.  Mean gradient 50 mmHg.  Sev TR. PASP  02/2017; c. 10/2018 Echo: EF 55-60%, Triv AI, mod dil LA, mod-sev TR, PASP 35-40, mild to mod red RV fxn.  . Coronary artery disease    a. mild, cath, 08/2010; b. medically managed  . Decreased hearing    Right ear  . Depression   . Gastric ulcer   . GERD (gastroesophageal reflux disease)   . Hypertension    BP higher than usual 04/19/10; amlodipine increased by telephone  . Mod-Sev Tricuspid regurgitation    a. 10/2018 Echo: Mod-Sev TR, PASP 35-72mmHg.  43m RLS (restless legs syndrome) 08/23/2015  . S/P AVR    a. [employer]. mechanical 2002; b. echo 08/2010 EF 60%, trival AI, mild MR, AVR working well; c.  on longterm warfarin tx  . SOB (shortness of breath) 10/11   08/2010,Episodes at 5 AM, eventually felt to be anxiety, after complete workup including catheterization, pt greatly improved with anxiety meds 11/11    Medications:  Medications Prior to Admission  Medication Sig Dispense Refill Last Dose  . allopurinol (ZYLOPRIM) 300 MG tablet Take 300 mg by mouth daily.    09/13/2020 at 0900  . Cetirizine HCl 10 MG CAPS Take 10 mg by mouth daily.   09/13/2020 at 0900  . finasteride (PROSCAR) 5 MG tablet Take 5 mg by mouth daily.     09/13/2020 at 0900  . metoprolol succinate (TOPROL-XL) 25 MG 24 hr tablet Take 0.5 tablets (12.5 mg total) by mouth daily. 90 tablet 3 09/13/2020 at 0900  . pantoprazole (PROTONIX) 40 MG tablet Take 40 mg by mouth daily.   09/13/2020 at 0900  . potassium chloride (KLOR-CON) 10 MEQ tablet TAKE 3 TABLETS (30 MEQ) BY MOUTH DAILY. TAKE AN ADDITIONAL 2TABS ON THE DAYS YOU TAKE METOLAZONE 294 tablet 0 09/13/2020 at 0900  . rOPINIRole (REQUIP) 2 MG tablet TAKE 1 TABLET BY MOUTH 4  TIMES DAILY (Patient taking differently: Take 2 mg by mouth in the morning, at noon, in the evening, and at bedtime. Take 1 tablet by mouth 4  times daily) 360 tablet 3 09/13/2020 at 0900  . torsemide (DEMADEX) 20 MG tablet Take 2 tablets (40 mg total) by mouth daily. Take additional 40 mg for 3  pounds weight gain . (Patient taking differently: Take 40 mg by mouth 2 (two) times daily. ) 60 tablet 2 09/13/2020 at 0900  . traZODone (DESYREL) 50 MG tablet Take 50 mg by mouth at bedtime.   09/12/2020 at 1600  . warfarin (COUMADIN) 2.5 MG tablet Take 1 tablet (2.5 mg total) by mouth daily at 4 PM. Takes daily at 1700 (Patient taking differently: Take 2.5-3.75 mg by mouth daily. Monday, Tuesday, thursday,friday,saturday, sunday 3.75mg -Wednesday)   09/12/2020 at 1600  . Acetaminophen 500 MG capsule Take 2 capsules by mouth every 8 (eight) hours as needed.   PRN at PRN  . busPIRone (BUSPAR) 5 MG tablet Take 1 tablet (5 mg total) by mouth 2 (two) times daily. (Patient not taking: Reported on 09/13/2020)   Not Taking at Unknown time  . DULoxetine (CYMBALTA) 30 MG capsule Take 1 capsule (30 mg total) by mouth daily. (Patient not taking: Reported on 09/13/2020) 30 capsule 3 Not Taking at Unknown time  . metolazone (ZAROXOLYN) 2.5 MG tablet Take 1 tablet (2.5 mg total) by mouth 2 (two) times a week. 72 tablet 3 09/11/2020   Scheduled:  . allopurinol  300 mg Oral Daily  . busPIRone  5 mg Oral BID  . chlorhexidine  15 mL Mouth Rinse BID  . Chlorhexidine Gluconate Cloth  6 each Topical Daily  . docusate sodium  200 mg Oral BID  . DULoxetine  30 mg Oral Daily  . finasteride  5 mg Oral Daily  . furosemide  40 mg Intravenous BID  . lidocaine  1 patch Transdermal Q24H  . mouth rinse  15 mL Mouth Rinse q12n4p  . metoprolol succinate  12.5 mg Oral Daily  . pantoprazole  40 mg Oral BID AC  . polyethylene glycol  17 g Oral Daily  . rOPINIRole  1 mg Oral Daily  . rOPINIRole  2 mg Oral TID  . traZODone  50 mg Oral QHS   Infusions:  . sodium chloride    . ceFEPime (MAXIPIME)  IV 1 g (09/22/20 0510)  . heparin Stopped (09/21/20 1700)  . norepinephrine (LEVOPHED) Adult infusion 5 mcg/min (09/21/20 2320)  . vancomycin     PRN: acetaminophen **OR** acetaminophen, ALPRAZolam, bisacodyl, morphine injection,  ondansetron **OR** ondansetron (ZOFRAN) IV Anti-infectives (From admission, onward)   Start     Dose/Rate Route Frequency Ordered Stop   09/22/20 0800  vancomycin (VANCOREADY) IVPB 1250 mg/250 mL        1,250 mg 166.7 mL/hr over 90 Minutes Intravenous Every 12 hours 09/21/20 1918     09/21/20 2200  ceFEPIme (MAXIPIME) 1 g in sodium chloride 0.9 % 100 mL IVPB        1 g 200 mL/hr over 30 Minutes Intravenous Every 8 hours 09/21/20 1905     09/21/20 2015  vancomycin (VANCOREADY) IVPB 2000 mg/400 mL        2,000 mg 200 mL/hr over 120 Minutes Intravenous  Once 09/21/20 1918 09/21/20 2319     Assessment: Pharmacy was consulted to start heparin and restart warfarin for mAVR and afib. INR is currently @1 .4. Most recent HL was 0.34.  Hgb 9.7, Plt 220.  Provider wants to continue heparin bridge until INR is therapeutic. Pt has an hx of GI bleeds. GI cleared patient to restart anticoagulation. NO major DDI with warfarin.   Home dose warfarin:  warfarin 2.5 mg [id number] and 3.75 mg on Wednesday  Date INR Warfarin Dose  10/20 1.4 baseline  10/25 -- 2.5 mg   10/26 1.2 4 mg  10/27 1.2 4 mg  10/28 1.4 5 mg   Goal of Therapy:  INR 2.5 to 3.5 and Heparin level 0.3 to 0.7 Monitor platelets by anticoagulation protocol: Yes   Plan:  Heparin level is currently SUBtherapeutic, per MAR heparin was stopped 10/28 ~5pm due to pt pulling line out and it was not restarted.  Will notify [facility] pharmacist to restart if warranted, INR is trending up and is 1.7 today.   INR still subtherapeutic @ 1.4. Pt received warfarin 4 mg for past 2 days.  Will increase dose by 25% and give warfarin 5 mg x 1 tonight to help get INR therapeutic. Daily INR ordered.   Bridging warfarin with heparin until INR is therapeutic and then can d/c heparin infusion.   Plan is to have INR therapeutic prior to discharge per MD.   11/28, PharmD 09/22/2020,7:04 AM

## 2020-09-23 ENCOUNTER — Inpatient Hospital Stay: Payer: Medicare Other

## 2020-09-23 DIAGNOSIS — K922 Gastrointestinal hemorrhage, unspecified: Secondary | ICD-10-CM | POA: Diagnosis not present

## 2020-09-23 DIAGNOSIS — J9601 Acute respiratory failure with hypoxia: Secondary | ICD-10-CM | POA: Diagnosis not present

## 2020-09-23 DIAGNOSIS — D62 Acute posthemorrhagic anemia: Secondary | ICD-10-CM | POA: Diagnosis not present

## 2020-09-23 DIAGNOSIS — N179 Acute kidney failure, unspecified: Secondary | ICD-10-CM | POA: Diagnosis not present

## 2020-09-23 LAB — CBC WITH DIFFERENTIAL/PLATELET
Abs Immature Granulocytes: 0.04 10*3/uL (ref 0.00–0.07)
Basophils Absolute: 0 10*3/uL (ref 0.0–0.1)
Basophils Relative: 0 %
Eosinophils Absolute: 0 10*3/uL (ref 0.0–0.5)
Eosinophils Relative: 0 %
HCT: 30.4 % — ABNORMAL LOW (ref 39.0–52.0)
Hemoglobin: 9.7 g/dL — ABNORMAL LOW (ref 13.0–17.0)
Immature Granulocytes: 1 %
Lymphocytes Relative: 7 %
Lymphs Abs: 0.6 10*3/uL — ABNORMAL LOW (ref 0.7–4.0)
MCH: 29.2 pg (ref 26.0–34.0)
MCHC: 31.9 g/dL (ref 30.0–36.0)
MCV: 91.6 fL (ref 80.0–100.0)
Monocytes Absolute: 0.7 10*3/uL (ref 0.1–1.0)
Monocytes Relative: 8 %
Neutro Abs: 7.4 10*3/uL (ref 1.7–7.7)
Neutrophils Relative %: 84 %
Platelets: 182 10*3/uL (ref 150–400)
RBC: 3.32 MIL/uL — ABNORMAL LOW (ref 4.22–5.81)
RDW: 17.1 % — ABNORMAL HIGH (ref 11.5–15.5)
WBC: 8.7 10*3/uL (ref 4.0–10.5)
nRBC: 0 % (ref 0.0–0.2)

## 2020-09-23 LAB — PHOSPHORUS: Phosphorus: 3 mg/dL (ref 2.5–4.6)

## 2020-09-23 LAB — BASIC METABOLIC PANEL
Anion gap: 11 (ref 5–15)
BUN: 34 mg/dL — ABNORMAL HIGH (ref 8–23)
CO2: 24 mmol/L (ref 22–32)
Calcium: 8.7 mg/dL — ABNORMAL LOW (ref 8.9–10.3)
Chloride: 99 mmol/L (ref 98–111)
Creatinine, Ser: 1.62 mg/dL — ABNORMAL HIGH (ref 0.61–1.24)
GFR, Estimated: 41 mL/min — ABNORMAL LOW (ref >60)
Glucose, Bld: 99 mg/dL (ref 70–99)
Potassium: 4.2 mmol/L (ref 3.5–5.1)
Sodium: 134 mmol/L — ABNORMAL LOW (ref 135–145)

## 2020-09-23 LAB — URINE CULTURE: Culture: NO GROWTH

## 2020-09-23 LAB — MAGNESIUM: Magnesium: 2.3 mg/dL (ref 1.7–2.4)

## 2020-09-23 LAB — PROTIME-INR
INR: 2.4 — ABNORMAL HIGH (ref 0.8–1.2)
Prothrombin Time: 25.7 seconds — ABNORMAL HIGH (ref 11.4–15.2)

## 2020-09-23 LAB — PROCALCITONIN: Procalcitonin: 13.03 ng/mL

## 2020-09-23 LAB — HEPARIN LEVEL (UNFRACTIONATED): Heparin Unfractionated: 0.27 IU/mL — ABNORMAL LOW (ref 0.30–0.70)

## 2020-09-23 MED ORDER — WARFARIN SODIUM 1 MG PO TABS
1.0000 mg | ORAL_TABLET | Freq: Once | ORAL | Status: AC
  Administered 2020-09-23: 1 mg via ORAL
  Filled 2020-09-23: qty 1

## 2020-09-23 NOTE — Consult Note (Signed)
ANTICOAGULATION CONSULT NOTE   Pharmacy Consult for Heparin and warfarin Indication: atrial fibrillation and mAVR  Patient Measurements: Height: 5\' 11"  (180.3 cm) Weight: 99.5 kg (219 lb 5.7 oz) IBW/kg (Calculated) : 75.3 Heparin Dosing Weight: 94.9 kg  Vital Signs: Temp: 98.1 F (36.7 C) (10/30 0400) Temp Source: Axillary (10/30 0400) BP: 108/84 (10/30 0600) Pulse Rate: 65 (10/30 0600)  Labs: Recent Labs    09/20/20 1344 09/20/20 2139 09/21/20 0351 09/21/20 0351 09/21/20 1215 09/21/20 1916 09/21/20 1916 09/21/20 2136 09/22/20 0504 09/23/20 0347  HGB  --   --  9.7*   < >  --  10.3*   < >  --  9.9* 9.7*  HCT  --   --  30.5*   < >  --  31.8*  --   --  32.0* 30.4*  PLT  --   --  220   < >  --  259  --   --  239 182  APTT 124*  --   --   --   --   --   --   --   --   --   LABPROT  --   --  16.3*  --   --   --   --   --  19.5* 25.7*  INR  --   --  1.4*  --   --   --   --   --  1.7* 2.4*  HEPARINUNFRC  --    < > 0.34   < > 0.30  --   --   --  <0.10* 0.27*  CREATININE  --   --   --   --   --   --   --  1.14 1.56* 1.62*   < > = values in this interval not displayed.    Estimated Creatinine Clearance: 38.6 mL/min (A) (by C-G formula based on SCr of 1.62 mg/dL (H)).   Medical History: Past Medical History:  Diagnosis Date  . Anxiety 10/11  . Aortic stenosis    a. s/p mechcanical AVR, 2002; b. 10/2018 Echo: Triv AI, mean grad 11/2018.  . Bradycardia    chronic, no symptoms 07/2010  . C. difficile colitis   . Carotid bruit    dopplers in past, no abnormalities  . Chronic diastolic CHF (congestive heart failure) (HCC)    a. Echo 03/2015: EF 60-65%, no RWMA, GR1DD, mild BAE, mild to mod MR, mod TR, PASP 65 mmHg; b. 01/2017 Echo: EF 55-60%, NRWMA, grade 1 diastolic dysfunction.  Normal functioning prosthetic aortic valve.  Mean gradient 50 mmHg.  Sev TR. PASP 02/2017; c. 10/2018 Echo: EF 55-60%, Triv AI, mod dil LA, mod-sev TR, PASP 35-40, mild to mod red RV fxn.  . Coronary  artery disease    a. mild, cath, 08/2010; b. medically managed  . Decreased hearing    Right ear  . Depression   . Gastric ulcer   . GERD (gastroesophageal reflux disease)   . Hypertension    BP higher than usual 04/19/10; amlodipine increased by telephone  . Mod-Sev Tricuspid regurgitation    a. 10/2018 Echo: Mod-Sev TR, PASP 35-91mmHg.  43m RLS (restless legs syndrome) 08/23/2015  . S/P AVR    a. [employer]. mechanical 2002; b. echo 08/2010 EF 60%, trival AI, mild MR, AVR working well; c. on longterm warfarin tx  . SOB (shortness of breath) 10/11   08/2010,Episodes at 5 AM, eventually felt to be anxiety, after complete workup including catheterization,  pt greatly improved with anxiety meds 11/11    Assessment: Pharmacy was consulted to start heparin and restart warfarin for mAVR and afib. INR 1.4 on admission. Hgb 9.7, Plt 220.  Provider wants to continue heparin bridge until INR is therapeutic. Pt has history of GI bleeds. GI cleared patient to restart anticoagulation. NO major DDI with warfarin.   Home dose warfarin:  warfarin 2.5 mg [id number] and 3.75 mg on Wednesday  warfarin course:  Date                            INR                              Dose 10/20                           1.4                               NONE 10/25                                                               2.5 mg 10/26                           1.2                               4 mg 10/27                           1.2   4 mg                                  10/28                           1.4                               5 mg 10/29                           1.7                               4 mg 10/30                           2.4                               1 mg        Goals of Therapy:  INR 2.5-3.5 (per Coumadin clinic) Heparin level 0.3 - 0.7 IU/mL Monitor platelets by anticoagulation protocol: Yes   Plan:  heparin  Anti-Xa level slightly subtherapeutic  Increase infusion rate  slightly to 1600 units/hr  Recheck anti-Xa level 8 hours after rate change  Repeat CBC in am  warfarin  INR slightly subtherapeutic but with and increase of approximately 40 % overnight:   Warfarin 1 mg po x 1  Re-check INR in am  Bridging warfarin with heparin until INR is therapeutic and then can d/c heparin infusion.   Plan is to have INR therapeutic prior to discharge per MD.   Lowella Bandy, PharmD 09/23/2020,7:10 AM

## 2020-09-23 NOTE — Progress Notes (Signed)
Progress Note    [name]  ZOX:[id number] DOB: [birth year]  DOA: 09/13/2020 PCP: Gracelyn Nurse, MD      Brief Narrative:    Medical records reviewed and are as summarized below:  [name] is a 86 y.o. male with medical history significant for aortic stenosis status post mechanical aortic valve replacement on Coumadin, permanent atrial fibrillation, chronic diastolic CHF, hypertension, CAD, chronic venous stasis, chronic anemia, GERD, history of C. difficile infection, recent GI bleed in September 2021) severe anemia with hemoglobin of 4.  He was referred to the emergency room at the behest of his primary care physician because of generalized weakness, lightheadedness and anemia with hemoglobin of 7.4.  He was admitted to the hospital for acute blood loss anemia likely from GI bleed.    Assessment/Plan:   Principal Problem:   Upper GI bleeding Active Problems:   Hypertension   Coronary artery disease   S/P AVR   Warfarin anticoagulation   History of mechanical aortic valve replacement   Chronic diastolic CHF (congestive heart failure) (HCC)   AKI (acute kidney injury) (HCC)   Permanent atrial fibrillation (HCC)   Acute blood loss anemia   History of lower GI bleeding   Symptomatic anemia   Fever   Acute respiratory failure with hypoxia (HCC)   Body mass index is 30.59 kg/m.  (Obesity)  Recent fever with T-max of 101.7 on 09/21/2020, suspected septic shock from ? aspiration pneumonia, leukocytosis, lactic acidosis: Leukocytosis has improved.  He is off of Levophed.  No growth on blood cultures thus far.  Continue empiric IV antibiotics. Abnormal urinalysis with pyuria.  Unclear if this is real urinary tract infection.  Follow-up urine cultures.  Acute kidney injury, recent hypotension: BP has improved.  Creatinine is still trending up.  Continue to monitor.  Lasix has been held.  Acute exacerbation of chronic diastolic CHF: Repeat chest  x-ray shows mild pulmonary edema.  Lasix has been held because of worsening kidney function.  Monitor cardiac and respiratory status closely.  2D echo showed EF estimated at 55 to 60%, grade 2 diastolic dysfunction,  Acute hypoxic respiratory failure: Improving.  He is tolerating 2 L/min oxygen via nasal cannula.  Taper off oxygen as able.    Altered mental status/delirium: Improved.  Continue supportive care  Acute blood loss anemia secondary to GI bleeding: s/p capsule endoscopy on 09/16/2019 which showed copious amount of coffee-ground liquid in the entire small bowel and likely small bowel evaluations.  S/p small bowel enteroscopy on 09/17/2020.  This showed nonbleeding duodenal ulcers with a clean ulcer base and "recent bleeding erosive to adenopathy treated with argon beam coagulation.  S/p blood transfusion with 1 unit of PRBCs on 09/13/2020. Continue Protonix.  H&H is stable.  Permanent atrial fibrillation, status post mechanical aortic valve replacement: INR is 2.4.  Continue IV heparin and monitor level per protocol.  Plan to discontinue IV heparin tomorrow if INR is therapeutic.  CAD: Continue aspirin, Coumadin and metoprolol  Anxiety: Continue BuSpar.  Xanax as needed.  Transfer patient from ICU to progressive cardiac unit.  This was discussed with intensivist, Dr. Allena Napoleon, via secure chat.   Diet Order            Diet Heart Room service appropriate? Yes; Fluid consistency: Thin  Diet effective now                    Consultants:  Gastroenterologist  Cardiologist  Procedures:  Small  bowel endoscopy    Medications:   . allopurinol  300 mg Oral Daily  . busPIRone  5 mg Oral BID  . chlorhexidine  15 mL Mouth Rinse BID  . Chlorhexidine Gluconate Cloth  6 each Topical Daily  . docusate sodium  200 mg Oral BID  . DULoxetine  30 mg Oral Daily  . finasteride  5 mg Oral Daily  . ipratropium-albuterol  3 mL Nebulization BID  . lidocaine  1 patch Transdermal Q24H   . mouth rinse  15 mL Mouth Rinse q12n4p  . metoprolol succinate  12.5 mg Oral Daily  . pantoprazole  40 mg Oral BID AC  . polyethylene glycol  17 g Oral Daily  . rOPINIRole  1 mg Oral Daily  . rOPINIRole  2 mg Oral TID  . traZODone  50 mg Oral QHS  . warfarin  1 mg Oral ONCE-1600  . Warfarin - Pharmacist Dosing Inpatient   Does not apply q1600   Continuous Infusions: . sodium chloride    . ceFEPime (MAXIPIME) IV Stopped (09/23/20 1036)  . heparin 1,600 Units/hr (09/23/20 1251)  . norepinephrine (LEVOPHED) Adult infusion Stopped (09/22/20 1030)     Anti-infectives (From admission, onward)   Start     Dose/Rate Route Frequency Ordered Stop   09/23/20 1000  vancomycin (VANCOREADY) IVPB 1250 mg/250 mL  Status:  Discontinued        1,250 mg 166.7 mL/hr over 90 Minutes Intravenous Every 24 hours 09/22/20 1251 09/22/20 1955   09/22/20 2200  ceFEPIme (MAXIPIME) 2 g in sodium chloride 0.9 % 100 mL IVPB        2 g 200 mL/hr over 30 Minutes Intravenous Every 12 hours 09/22/20 1251     09/22/20 0800  vancomycin (VANCOREADY) IVPB 1250 mg/250 mL  Status:  Discontinued        1,250 mg 166.7 mL/hr over 90 Minutes Intravenous Every 12 hours 09/21/20 1918 09/22/20 1251   09/21/20 2200  ceFEPIme (MAXIPIME) 1 g in sodium chloride 0.9 % 100 mL IVPB  Status:  Discontinued        1 g 200 mL/hr over 30 Minutes Intravenous Every 8 hours 09/21/20 1905 09/22/20 1251   09/21/20 2015  vancomycin (VANCOREADY) IVPB 2000 mg/400 mL        2,000 mg 200 mL/hr over 120 Minutes Intravenous  Once 09/21/20 1918 09/21/20 2319             Family Communication/Anticipated D/C date and plan/Code Status   DVT prophylaxis: SCDs Start: 09/13/20 1948     Code Status: Full Code  Family Communication:  Disposition Plan:    Status is: Inpatient  Remains inpatient appropriate because:IV treatments appropriate due to intensity of illness or inability to take PO and Mechanical aortic valve with subtherapeutic  INR   Dispo: The patient is from: Home              Anticipated d/c is to: SNF              Anticipated d/c date is: 2 days              Patient currently is not medically stable to d/c.           Subjective:   Interval events noted.  He is feeling better today.  No acute issues overnight.  No shortness of breath or chest pain.   Objective:    Vitals:   09/23/20 0900 09/23/20 1002 09/23/20 1100 09/23/20 1200  BP: Marland Kitchen)  109/56 111/60 123/71 (!) 147/77  Pulse: 68 73 77 82  Resp: 16  18 17   Temp:    97.8 F (36.6 C)  TempSrc:    Axillary  SpO2: 98%  97% 100%  Weight:      Height:       No data found.   Intake/Output Summary (Last 24 hours) at 09/23/2020 1304 Last data filed at 09/23/2020 1220 Gross per 24 hour  Intake 973.85 ml  Output 595 ml  Net 378.85 ml   Filed Weights   09/18/20 0634 09/19/20 0641 09/21/20 0512  Weight: 95.9 kg 97.7 kg 99.5 kg    Exam:   GEN: NAD SKIN: Warm and dry EYES: EOMI ENT: MMM CV: RRR PULM: CTA B ABD: soft, ND, NT, +BS CNS: AAO x 3, non focal EXT: No edema or tenderness      Data Reviewed:   I have personally reviewed following labs and imaging studies:  Labs: Labs show the following:   Basic Metabolic Panel: Recent Labs  Lab 09/17/20 0517 09/17/20 0517 09/18/20 [id number] 09/18/20 [id number] 09/19/20 0532 09/19/20 0532 09/20/20 0629 09/20/20 0629 09/21/20 2136 09/22/20 0504 09/23/20 0347  NA 134*   < > 135  --  136  --  135  --  134*  --  134*  K 3.5   < > 3.7   < > 3.8   < > 4.0   < > 4.2  --  4.2  CL 100   < > 100  --  99  --  101  --  100  --  99  CO2 26   < > 26  --  28  --  26  --  24  --  24  GLUCOSE 99   < > 91  --  88  --  99  --  108*  --  99  BUN 17   < > 16  --  18  --  19  --  23  --  34*  CREATININE 0.79   < > 0.81   < > 0.88  --  0.85  --  1.14 1.56* 1.62*  CALCIUM 8.5*   < > 8.4*  --  8.8*  --  8.8*  --  8.4*  --  8.7*  MG 2.7*  --  2.4  --  2.5*  --   --   --   --   --  2.3  PHOS  --   --    --   --   --   --   --   --   --   --  3.0   < > = values in this interval not displayed.   GFR Estimated Creatinine Clearance: 38.6 mL/min (A) (by C-G formula based on SCr of 1.62 mg/dL (H)). Liver Function Tests: No results for input(s): AST, ALT, ALKPHOS, BILITOT, PROT, ALBUMIN in the last 168 hours. No results for input(s): LIPASE, AMYLASE in the last 168 hours. No results for input(s): AMMONIA in the last 168 hours. Coagulation profile Recent Labs  Lab 09/19/20 0532 09/20/20 0629 09/21/20 0351 09/22/20 0504 09/23/20 0347  INR 1.2 1.2 1.4* 1.7* 2.4*    CBC: Recent Labs  Lab 09/20/20 0629 09/21/20 0351 09/21/20 1916 09/22/20 0504 09/23/20 0347  WBC 4.2 5.6 16.5* 21.8* 8.7  NEUTROABS  --   --  15.1*  --  7.4  HGB 9.6* 9.7* 10.3* 9.9* 9.7*  HCT 30.0* 30.5* 31.8* 32.0* 30.4*  MCV  92.9 91.6 90.1 92.5 91.6  PLT 211 220 259 239 182   Cardiac Enzymes: No results for input(s): CKTOTAL, CKMB, CKMBINDEX, TROPONINI in the last 168 hours. BNP (last 3 results) No results for input(s): PROBNP in the last 8760 hours. CBG: Recent Labs  Lab 09/16/20 2133 09/21/20 1839  GLUCAP 131* 101*   D-Dimer: No results for input(s): DDIMER in the last 72 hours. Hgb A1c: No results for input(s): HGBA1C in the last 72 hours. Lipid Profile: No results for input(s): CHOL, HDL, LDLCALC, TRIG, CHOLHDL, LDLDIRECT in the last 72 hours. Thyroid function studies: No results for input(s): TSH, T4TOTAL, T3FREE, THYROIDAB in the last 72 hours.  Invalid input(s): FREET3 Anemia work up: No results for input(s): VITAMINB12, FOLATE, FERRITIN, TIBC, IRON, RETICCTPCT in the last 72 hours. Sepsis Labs: Recent Labs  Lab 09/21/20 0351 09/21/20 1916 09/22/20 0504 09/22/20 1637 09/22/20 2002 09/23/20 0347  PROCALCITON  --  0.38 15.34  --   --  13.03  WBC 5.6 16.5* 21.8*  --   --  8.7  LATICACIDVEN  --   --   --  3.6* 2.2*  --     Microbiology Recent Results (from the past 240 hour(s))   Respiratory Panel by RT PCR (Flu A&B, Covid) - Nasopharyngeal Swab     Status: None   Collection Time: 09/13/20  5:55 PM   Specimen: Nasopharyngeal Swab  Result Value Ref Range Status   SARS Coronavirus 2 by RT PCR NEGATIVE NEGATIVE Final    Comment: (NOTE) SARS-CoV-2 target nucleic acids are NOT DETECTED.  The SARS-CoV-2 RNA is generally detectable in upper respiratoy specimens during the acute phase of infection. The lowest concentration of SARS-CoV-2 viral copies this assay can detect is 131 copies/mL. A negative result does not preclude SARS-Cov-2 infection and should not be used as the sole basis for treatment or other patient management decisions. A negative result may occur with  improper specimen collection/handling, submission of specimen other than nasopharyngeal swab, presence of viral mutation(s) within the areas targeted by this assay, and inadequate number of viral copies (<131 copies/mL). A negative result must be combined with clinical observations, patient history, and epidemiological information. The expected result is Negative.  Fact Sheet for Patients:  https://www.moore.com/  Fact Sheet for Healthcare Providers:  https://www.young.biz/  This test is no t yet approved or cleared by the [place] FDA and  has been authorized for detection and/or diagnosis of SARS-CoV-2 by FDA under an Emergency Use Authorization (EUA). This EUA will remain  in effect (meaning this test can be used) for the duration of the COVID-19 declaration under Section 564(b)(1) of the Act, 21 U.S.C. section 360bbb-3(b)(1), unless the authorization is terminated or revoked sooner.     Influenza A by PCR NEGATIVE NEGATIVE Final   Influenza B by PCR NEGATIVE NEGATIVE Final    Comment: (NOTE) The Xpert Xpress SARS-CoV-2/FLU/RSV assay is intended as an aid in  the diagnosis of influenza from Nasopharyngeal swab specimens and  should not be used as  a sole basis for treatment. Nasal washings and  aspirates are unacceptable for Xpert Xpress SARS-CoV-2/FLU/RSV  testing.  Fact Sheet for Patients: https://www.moore.com/  Fact Sheet for Healthcare Providers: https://www.young.biz/  This test is not yet approved or cleared by the [place] FDA and  has been authorized for detection and/or diagnosis of SARS-CoV-2 by  FDA under an Emergency Use Authorization (EUA). This EUA will remain  in effect (meaning this test can be used) for the duration of  the  Covid-19 declaration under Section 564(b)(1) of the Act, 21  U.S.C. section 360bbb-3(b)(1), unless the authorization is  terminated or revoked. Performed at [facility], [address] [name], [place] [address]   Urine Culture     Status: None   Collection Time: 09/21/20  7:00 PM   Specimen: Urine, Catheterized  Result Value Ref Range Status   Specimen Description   Final    URINE, CATHETERIZED Performed at [facility], [address] [place], [place] [address]    Special Requests   Final    NONE Performed at [facility], [address] [place], [place] [address]    Culture   Final    NO GROWTH Performed at [facility], [address] [place], [place] [address]    Report Status 09/23/2020 FINAL  Final  MRSA PCR Screening     Status: None   Collection Time: 09/21/20  7:00 PM   Specimen: Nasopharyngeal  Result Value Ref Range Status   MRSA by PCR NEGATIVE NEGATIVE Final    Comment:        The GeneXpert MRSA Assay (FDA approved for NASAL specimens only), is one component of a comprehensive MRSA colonization surveillance program. It is not intended to diagnose MRSA infection nor to guide or monitor treatment for MRSA infections. Performed at [facility], [address] [place], [place] [address]   CULTURE, BLOOD (ROUTINE X 2) w Reflex to ID Panel     Status: None  (Preliminary result)   Collection Time: 09/21/20  9:36 PM   Specimen: BLOOD  Result Value Ref Range Status   Specimen Description BLOOD BLOOD LEFT HAND  Final   Special Requests   Final    BOTTLES DRAWN AEROBIC AND ANAEROBIC Blood Culture adequate volume   Culture   Final    NO GROWTH 2 DAYS Performed at [facility], [address], [place], [place] [address]    Report Status PENDING  Incomplete  CULTURE, BLOOD (ROUTINE X 2) w Reflex to ID Panel     Status: None (Preliminary result)   Collection Time: 09/21/20  9:36 PM   Specimen: BLOOD  Result Value Ref Range Status   Specimen Description BLOOD LEFT ANTECUBITAL  Final   Special Requests   Final    BOTTLES DRAWN AEROBIC ONLY Blood Culture results may not be optimal due to an inadequate volume of blood received in culture bottles   Culture   Final    NO GROWTH 2 DAYS Performed at [facility], [address], [place], [place] [address]    Report Status PENDING  Incomplete    Procedures and diagnostic studies:  DG Chest Port 1 View  Result Date: 09/23/2020 CLINICAL DATA:  Shortness of breath EXAM: PORTABLE CHEST 1 VIEW COMPARISON:  09/21/2020 FINDINGS: Mild cardiomegaly with hazy bilateral opacities. Small pleural effusions. Remote median sternotomy IMPRESSION: Mild pulmonary edema with mild cardiomegaly and small pleural effusions Electronically Signed   By: Deatra Robinson M.D.   On: 09/23/2020 06:31   DG Chest Port 1 View  Result Date: 09/21/2020 CLINICAL DATA:  86 year old male with hypoxia. EXAM: PORTABLE CHEST 1 VIEW COMPARISON:  Chest radiograph dated 09/16/2020. FINDINGS: There is stable cardiomegaly with vascular congestion and edema. Overall interval progression of the CHF compared to the prior radiograph. Small bilateral pleural effusions. Superimposed pneumonia is not excluded. No pneumothorax. Median sternotomy wires and mechanical heart valve. No acute osseous pathology. IMPRESSION: Cardiomegaly with  CHF  and small bilateral pleural effusions, worsened since the prior radiograph. Electronically Signed   By: Elgie Collard M.D.   On: 09/21/2020 18:01   ECHOCARDIOGRAM COMPLETE  Result Date: 09/22/2020    ECHOCARDIOGRAM REPORT   Patient Name:   LYDELL MOGA [facility] Date of Exam: 09/22/2020 Medical Rec #:  [id number]            Height:       71.0 in Accession #:    [id number]           Weight:       219.4 lb Date of Birth:  06/05/1931           BSA:          2.193 m Patient Age:    89 years             BP:           102/36 mmHg Patient Gender: M                    HR:           70 bpm. Exam Location:  [facility] Procedure: 2D Echo, Color Doppler and Cardiac Doppler Indications:     R78.81 Bacteremia  History:         Patient has prior history of Echocardiogram examinations, most                  recent 08/06/2020. CHF, CAD; Risk Factors:Hypertension. S/P AVR.  Sonographer:     Humphrey Rolls RDCS (AE) Referring Phys:  [id number] Elyn Aquas Diagnosing Phys: Lorine Bears MD  Sonographer Comments: Image acquisition challenging due to uncooperative patient and Image acquisition challenging due to patient body habitus. IMPRESSIONS  1. Left ventricular ejection fraction, by estimation, is 55 to 60%. The left ventricle has normal function. Left ventricular endocardial border not optimally defined to evaluate regional wall motion. There is mild left ventricular hypertrophy. Left ventricular diastolic parameters are consistent with Grade II diastolic dysfunction (pseudonormalization).  2. Right ventricular systolic function is mildly reduced. The right ventricular size is moderately enlarged. There is normal pulmonary artery systolic pressure.  3. Left atrial size was severely dilated.  4. Right atrial size was severely dilated.  5. The mitral valve is normal in structure. Mild mitral valve regurgitation. No evidence of mitral stenosis. Moderate mitral annular calcification.  6. Tricuspid valve regurgitation is moderate.  7.  The aortic valve was not well visualized. Aortic valve regurgitation is not visualized. No aortic stenosis is present. Aortic valve mean gradient measures 5.0 mmHg.  8. The inferior vena cava is dilated in size with <50% respiratory variability, suggesting right atrial pressure of 15 mmHg. Conclusion(s)/Recommendation(s): No evidence of valvular vegetations on this transthoracic echocardiogram. Consider a transesophageal echocardiogram to exclude infective endocarditis if clinically indicated. FINDINGS  Left Ventricle: Left ventricular ejection fraction, by estimation, is 55 to 60%. The left ventricle has normal function. Left ventricular endocardial border not optimally defined to evaluate regional wall motion. The left ventricular internal cavity size was normal in size. There is mild left ventricular hypertrophy. Left ventricular diastolic parameters are consistent with Grade II diastolic dysfunction (pseudonormalization). Right Ventricle: The right ventricular size is moderately enlarged. No increase in right ventricular wall thickness. Right ventricular systolic function is mildly reduced. There is normal pulmonary artery systolic pressure. The tricuspid regurgitant velocity is 2.05 m/s, and with an assumed right atrial pressure of 15 mmHg, the estimated right ventricular systolic pressure is 31.8 mmHg. Left  Atrium: Left atrial size was severely dilated. Right Atrium: Right atrial size was severely dilated. Pericardium: There is no evidence of pericardial effusion. Mitral Valve: The mitral valve is normal in structure. There is moderate calcification of the mitral valve leaflet(s). Moderate mitral annular calcification. Mild mitral valve regurgitation. No evidence of mitral valve stenosis. MV peak gradient, 7.3 mmHg. The mean mitral valve gradient is 2.0 mmHg. Tricuspid Valve: The tricuspid valve is normal in structure. Tricuspid valve regurgitation is moderate . No evidence of tricuspid stenosis. Aortic Valve:  The aortic valve was not well visualized. Aortic valve regurgitation is not visualized. No aortic stenosis is present. Aortic valve mean gradient measures 5.0 mmHg. Aortic valve peak gradient measures 10.2 mmHg. Aortic valve area, by VTI measures 1.32 cm. There is a mechanical valve present in the aortic position. Pulmonic Valve: The pulmonic valve was normal in structure. Pulmonic valve regurgitation is not visualized. No evidence of pulmonic stenosis. Aorta: The aortic root is normal in size and structure. Venous: The inferior vena cava is dilated in size with less than 50% respiratory variability, suggesting right atrial pressure of 15 mmHg. IAS/Shunts: No atrial level shunt detected by color flow Doppler.  LEFT VENTRICLE PLAX 2D LVIDd:         4.16 cm  Diastology LVIDs:         2.62 cm  LV e' medial:   6.20 cm/s LV PW:         1.13 cm  LV E/e' medial: 20.6 LV IVS:        0.87 cm LVOT diam:     2.10 cm LV SV:         39 LV SV Index:   18 LVOT Area:     3.46 cm  LEFT ATRIUM            Index LA diam:      4.40 cm  2.01 cm/m LA Vol (A4C): 103.0 ml 46.97 ml/m  AORTIC VALVE                    PULMONIC VALVE AV Area (Vmax):    1.57 cm     PV Vmax:       0.83 m/s AV Area (Vmean):   1.41 cm     PV Vmean:      47.700 cm/s AV Area (VTI):     1.32 cm     PV VTI:        0.152 m AV Vmax:           160.00 cm/s  PV Peak grad:  2.7 mmHg AV Vmean:          108.000 cm/s PV Mean grad:  1.0 mmHg AV VTI:            0.296 m AV Peak Grad:      10.2 mmHg AV Mean Grad:      5.0 mmHg LVOT Vmax:         72.40 cm/s LVOT Vmean:        44.000 cm/s LVOT VTI:          0.113 m LVOT/AV VTI ratio: 0.38  AORTA Ao Root diam: 3.10 cm MITRAL VALVE                TRICUSPID VALVE MV Area (PHT): 2.34 cm     TR Peak grad:   16.8 mmHg MV Peak grad:  7.3 mmHg     TR Vmax:  205.00 cm/s MV Mean grad:  2.0 mmHg MV Vmax:       1.35 m/s     SHUNTS MV Vmean:      64.1 cm/s    Systemic VTI:  0.11 m MV Decel Time: 324 msec     Systemic Diam: 2.10 cm  MV E velocity: 128.00 cm/s MV A velocity: 45.10 cm/s MV E/A ratio:  2.84 Lorine Bears MD Electronically signed by Lorine Bears MD Signature Date/Time: 09/22/2020/2:31:19 PM    Final                LOS: 10 days   Saavi Mceachron  [facility]   Pager on [email or web address]. If 7PM-7AM, please contact night-coverage at [email or web address]     09/23/2020, 1:04 PM

## 2020-09-24 DIAGNOSIS — J9601 Acute respiratory failure with hypoxia: Secondary | ICD-10-CM | POA: Diagnosis not present

## 2020-09-24 DIAGNOSIS — D62 Acute posthemorrhagic anemia: Secondary | ICD-10-CM | POA: Diagnosis not present

## 2020-09-24 DIAGNOSIS — K922 Gastrointestinal hemorrhage, unspecified: Secondary | ICD-10-CM | POA: Diagnosis not present

## 2020-09-24 DIAGNOSIS — N179 Acute kidney failure, unspecified: Secondary | ICD-10-CM | POA: Diagnosis not present

## 2020-09-24 LAB — BASIC METABOLIC PANEL
Anion gap: 9 (ref 5–15)
BUN: 34 mg/dL — ABNORMAL HIGH (ref 8–23)
CO2: 25 mmol/L (ref 22–32)
Calcium: 8.4 mg/dL — ABNORMAL LOW (ref 8.9–10.3)
Chloride: 101 mmol/L (ref 98–111)
Creatinine, Ser: 1.48 mg/dL — ABNORMAL HIGH (ref 0.61–1.24)
GFR, Estimated: 46 mL/min — ABNORMAL LOW (ref >60)
Glucose, Bld: 100 mg/dL — ABNORMAL HIGH (ref 70–99)
Potassium: 3.7 mmol/L (ref 3.5–5.1)
Sodium: 135 mmol/L (ref 135–145)

## 2020-09-24 LAB — HEPARIN LEVEL (UNFRACTIONATED): Heparin Unfractionated: 0.37 IU/mL (ref 0.30–0.70)

## 2020-09-24 LAB — CBC
HCT: 28.1 % — ABNORMAL LOW (ref 39.0–52.0)
Hemoglobin: 8.9 g/dL — ABNORMAL LOW (ref 13.0–17.0)
MCH: 29.2 pg (ref 26.0–34.0)
MCHC: 31.7 g/dL (ref 30.0–36.0)
MCV: 92.1 fL (ref 80.0–100.0)
Platelets: 167 10*3/uL (ref 150–400)
RBC: 3.05 MIL/uL — ABNORMAL LOW (ref 4.22–5.81)
RDW: 17.3 % — ABNORMAL HIGH (ref 11.5–15.5)
WBC: 6.6 10*3/uL (ref 4.0–10.5)
nRBC: 0 % (ref 0.0–0.2)

## 2020-09-24 LAB — PROTIME-INR
INR: 2.6 — ABNORMAL HIGH (ref 0.8–1.2)
Prothrombin Time: 26.7 seconds — ABNORMAL HIGH (ref 11.4–15.2)

## 2020-09-24 LAB — LACTIC ACID, PLASMA: Lactic Acid, Venous: 1.1 mmol/L (ref 0.5–1.9)

## 2020-09-24 LAB — GLUCOSE, CAPILLARY: Glucose-Capillary: 73 mg/dL (ref 70–99)

## 2020-09-24 LAB — MAGNESIUM: Magnesium: 2.4 mg/dL (ref 1.7–2.4)

## 2020-09-24 LAB — URINE CULTURE: Culture: NO GROWTH

## 2020-09-24 LAB — PHOSPHORUS: Phosphorus: 3.2 mg/dL (ref 2.5–4.6)

## 2020-09-24 MED ORDER — IPRATROPIUM-ALBUTEROL 0.5-2.5 (3) MG/3ML IN SOLN
3.0000 mL | RESPIRATORY_TRACT | Status: DC | PRN
  Administered 2020-09-25 – 2020-09-26 (×3): 3 mL via RESPIRATORY_TRACT
  Filled 2020-09-24 (×3): qty 3

## 2020-09-24 MED ORDER — TORSEMIDE 20 MG PO TABS
40.0000 mg | ORAL_TABLET | Freq: Every day | ORAL | Status: DC
  Administered 2020-09-24 – 2020-09-26 (×3): 40 mg via ORAL
  Filled 2020-09-24 (×3): qty 2

## 2020-09-24 MED ORDER — AMOXICILLIN-POT CLAVULANATE 875-125 MG PO TABS
1.0000 | ORAL_TABLET | Freq: Two times a day (BID) | ORAL | Status: DC
  Administered 2020-09-24 – 2020-09-26 (×5): 1 via ORAL
  Filled 2020-09-24 (×5): qty 1

## 2020-09-24 MED ORDER — WARFARIN SODIUM 2.5 MG PO TABS
2.5000 mg | ORAL_TABLET | Freq: Once | ORAL | Status: AC
  Administered 2020-09-24: 2.5 mg via ORAL
  Filled 2020-09-24: qty 1

## 2020-09-24 MED ORDER — FUROSEMIDE 10 MG/ML IJ SOLN
20.0000 mg | Freq: Once | INTRAMUSCULAR | Status: AC
  Administered 2020-09-24: 20 mg via INTRAVENOUS
  Filled 2020-09-24: qty 2

## 2020-09-24 NOTE — Consult Note (Signed)
ANTICOAGULATION CONSULT NOTE   Pharmacy Consult for Heparin and warfarin Indication: atrial fibrillation and mAVR  Patient Measurements: Height: 5\' 11"  (180.3 cm) Weight: 100.5 kg (221 lb 9.6 oz) IBW/kg (Calculated) : 75.3 Heparin Dosing Weight: 94.9 kg  Vital Signs: Temp: 97.9 F (36.6 C) (10/31 0730) Temp Source: Oral (10/31 0133) BP: 122/72 (10/31 0730) Pulse Rate: 69 (10/31 0842)  Labs: Recent Labs    09/22/20 0504 09/22/20 0504 09/23/20 0347 09/24/20 0507  HGB 9.9*   < > 9.7* 8.9*  HCT 32.0*  --  30.4* 28.1*  PLT 239  --  182 167  LABPROT 19.5*  --  25.7* 26.7*  INR 1.7*  --  2.4* 2.6*  HEPARINUNFRC <0.10*  --  0.27* 0.37  CREATININE 1.56*  --  1.62* 1.48*   < > = values in this interval not displayed.    Estimated Creatinine Clearance: 42.5 mL/min (A) (by C-G formula based on SCr of 1.48 mg/dL (H)).   Medical History: Past Medical History:  Diagnosis Date  . Anxiety 10/11  . Aortic stenosis    a. s/p mechcanical AVR, 2002; b. 10/2018 Echo: Triv AI, mean grad 11/2018.  . Bradycardia    chronic, no symptoms 07/2010  . C. difficile colitis   . Carotid bruit    dopplers in past, no abnormalities  . Chronic diastolic CHF (congestive heart failure) (HCC)    a. Echo 03/2015: EF 60-65%, no RWMA, GR1DD, mild BAE, mild to mod MR, mod TR, PASP 65 mmHg; b. 01/2017 Echo: EF 55-60%, NRWMA, grade 1 diastolic dysfunction.  Normal functioning prosthetic aortic valve.  Mean gradient 50 mmHg.  Sev TR. PASP 02/2017; c. 10/2018 Echo: EF 55-60%, Triv AI, mod dil LA, mod-sev TR, PASP 35-40, mild to mod red RV fxn.  . Coronary artery disease    a. mild, cath, 08/2010; b. medically managed  . Decreased hearing    Right ear  . Depression   . Gastric ulcer   . GERD (gastroesophageal reflux disease)   . Hypertension    BP higher than usual 04/19/10; amlodipine increased by telephone  . Mod-Sev Tricuspid regurgitation    a. 10/2018 Echo: Mod-Sev TR, PASP 35-39mmHg.  43m RLS (restless  legs syndrome) 08/23/2015  . S/P AVR    a. [employer]. mechanical 2002; b. echo 08/2010 EF 60%, trival AI, mild MR, AVR working well; c. on longterm warfarin tx  . SOB (shortness of breath) 10/11   08/2010,Episodes at 5 AM, eventually felt to be anxiety, after complete workup including catheterization, pt greatly improved with anxiety meds 11/11    Assessment: Pharmacy was consulted to start heparin and restart warfarin for mAVR and afib. INR 1.4 on admission. Hgb 9.7, Plt 220.  Provider wants to continue heparin bridge until INR is therapeutic. Pt has history of GI bleeds. GI cleared patient to restart anticoagulation. NO major DDI with warfarin.   Home dose warfarin:  warfarin 2.5 mg [id number] and 3.75 mg on Wednesday  warfarin course:  Date                            INR                              Dose 10/20  1.4                               NONE 10/25                                                               2.5 mg 10/26                           1.2                               4 mg 10/27                           1.2   4 mg                                  10/28                           1.4                               5 mg 10/29                           1.7                               4 mg 10/30                           2.4                               1 mg  10/31   2.6        2.5 mg  Goals of Therapy:  INR 2.5-3.5 (per Coumadin clinic) Monitor platelets by anticoagulation protocol: Yes   Plan:   heparin  INR now therapeutic  Stop heparin warfarin  INR therapeutict:   Warfarin 2.5 mg po x 1  Re-check INR in am  Lowella Bandy, PharmD 09/24/2020,9:16 AM

## 2020-09-24 NOTE — Progress Notes (Addendum)
Progress Note    [name]  ZOX:[id number] DOB: 18-Apr-1931  DOA: 09/13/2020 PCP: Gracelyn NurseJohnston, [name], MD      Brief Narrative:    Medical records reviewed and are as summarized below:  [name] is a 86 y.o. male with medical history significant for aortic stenosis status post mechanical aortic valve replacement on Coumadin, permanent atrial fibrillation, chronic diastolic CHF, hypertension, CAD, chronic venous stasis, chronic anemia, GERD, history of C. difficile infection, recent GI bleed in September 2021) severe anemia with hemoglobin of 4.  He was referred to the emergency room at the behest of his primary care physician because of generalized weakness, lightheadedness and anemia with hemoglobin of 7.4.  He was admitted to the hospital for acute blood loss anemia likely from GI bleed.    Assessment/Plan:   Principal Problem:   Upper GI bleeding Active Problems:   Hypertension   Coronary artery disease   S/P AVR   Warfarin anticoagulation   History of mechanical aortic valve replacement   Chronic diastolic CHF (congestive heart failure) (HCC)   AKI (acute kidney injury) (HCC)   Permanent atrial fibrillation (HCC)   Acute blood loss anemia   History of lower GI bleeding   Symptomatic anemia   Fever   Acute respiratory failure with hypoxia (HCC)   Body mass index is 30.91 kg/m.  (Obesity)  Recent fever with T-max of 101.7 on 09/21/2020, suspected septic shock from ? aspiration pneumonia, leukocytosis, lactic acidosis, elevated procalcitonin at 15.34: Leukocytosis is resolved.  No growth on blood cultures as well.  Discontinue IV cefepime and start Augmentin.   Abnormal urinalysis with pyuria but urine culture did not show any growth.     Acute kidney injury, recent hypotension: BP has improved.  Creatinine is starting to trend down.  Continue to monitor.    Acute exacerbation of chronic diastolic CHF: Resume home dose of torsemide to avoid  decompensated CHF.  2D echo showed EF estimated at 55 to 60%, grade 2 diastolic dysfunction,  Acute hypoxic respiratory failure: Continue 2 L/min oxygen via nasal cannula and taper off as able.    Delirium/acute metabolic encephalopathy: Resolved.  Acute blood loss anemia secondary to GI bleeding: s/p capsule endoscopy on 09/16/2019 which showed copious amount of coffee-ground liquid in the entire small bowel and likely small bowel evaluations.  S/p small bowel enteroscopy on 09/17/2020.  This showed nonbleeding duodenal ulcers with a clean ulcer base and "recent bleeding erosive to adenopathy treated with argon beam coagulation.  S/p blood transfusion with 1 unit of PRBCs on 09/13/2020. Continue Protonix.  H&H is stable.  Permanent atrial fibrillation, status post mechanical aortic valve replacement: INR is 2.6.  Discontinue IV heparin infusion.  Continue to monitor INR and adjust Coumadin accordingly.    CAD: Continue aspirin, Coumadin and metoprolol  Anxiety: Continue BuSpar.  Xanax as needed.  Tender black lesion (nodule) on the medial border of the dorsal aspect of the left wrist joint.  Unclear if this is from trauma.  Analgesics as needed for pain.  Continue to monitor.  Discontinue Foley catheter   Rapid response was called because of decreased oxygen saturation in the 60s while on 2 L/min oxygen.  I went back to the bedside to see the patient.  It was discovered that the pulse oximeter reading was inaccurate and oxygen saturation 92 to 94% on 2 L/min oxygen.  His fingers were cold and the fingertips were bluish.  Unclear if patient has history  of Raynaud's disease.  Plan of care was discussed with the patient's son, Sid, at the bedside.   Diet Order            Diet Heart Room service appropriate? Yes; Fluid consistency: Thin  Diet effective now                    Consultants:  Gastroenterologist  Cardiologist  Procedures:  Small bowel  endoscopy    Medications:   . allopurinol  300 mg Oral Daily  . amoxicillin-clavulanate  1 tablet Oral Q12H  . busPIRone  5 mg Oral BID  . chlorhexidine  15 mL Mouth Rinse BID  . Chlorhexidine Gluconate Cloth  6 each Topical Daily  . docusate sodium  200 mg Oral BID  . DULoxetine  30 mg Oral Daily  . finasteride  5 mg Oral Daily  . ipratropium-albuterol  3 mL Nebulization BID  . lidocaine  1 patch Transdermal Q24H  . mouth rinse  15 mL Mouth Rinse q12n4p  . metoprolol succinate  12.5 mg Oral Daily  . pantoprazole  40 mg Oral BID AC  . polyethylene glycol  17 g Oral Daily  . rOPINIRole  1 mg Oral Daily  . rOPINIRole  2 mg Oral TID  . torsemide  40 mg Oral Daily  . traZODone  50 mg Oral QHS  . warfarin  2.5 mg Oral ONCE-1600  . Warfarin - Pharmacist Dosing Inpatient   Does not apply q1600   Continuous Infusions: . sodium chloride       Anti-infectives (From admission, onward)   Start     Dose/Rate Route Frequency Ordered Stop   09/24/20 1000  amoxicillin-clavulanate (AUGMENTIN) 875-125 MG per tablet 1 tablet        1 tablet Oral Every 12 hours 09/24/20 0806     09/23/20 1000  vancomycin (VANCOREADY) IVPB 1250 mg/250 mL  Status:  Discontinued        1,250 mg 166.7 mL/hr over 90 Minutes Intravenous Every 24 hours 09/22/20 1251 09/22/20 1955   09/22/20 2200  ceFEPIme (MAXIPIME) 2 g in sodium chloride 0.9 % 100 mL IVPB  Status:  Discontinued        2 g 200 mL/hr over 30 Minutes Intravenous Every 12 hours 09/22/20 1251 09/24/20 0806   09/22/20 0800  vancomycin (VANCOREADY) IVPB 1250 mg/250 mL  Status:  Discontinued        1,250 mg 166.7 mL/hr over 90 Minutes Intravenous Every 12 hours 09/21/20 1918 09/22/20 1251   09/21/20 2200  ceFEPIme (MAXIPIME) 1 g in sodium chloride 0.9 % 100 mL IVPB  Status:  Discontinued        1 g 200 mL/hr over 30 Minutes Intravenous Every 8 hours 09/21/20 1905 09/22/20 1251   09/21/20 2015  vancomycin (VANCOREADY) IVPB 2000 mg/400 mL        2,000  mg 200 mL/hr over 120 Minutes Intravenous  Once 09/21/20 1918 09/21/20 2319             Family Communication/Anticipated D/C date and plan/Code Status   DVT prophylaxis: SCDs Start: 09/13/20 1948     Code Status: Full Code  Family Communication:  Disposition Plan:    Status is: Inpatient  Remains inpatient appropriate because:IV treatments appropriate due to intensity of illness or inability to take PO and Mechanical aortic valve with subtherapeutic INR   Dispo: The patient is from: Home              Anticipated  d/c is to: SNF              Anticipated d/c date is: 2 days              Patient currently is not medically stable to d/c.           Subjective:   C/o pain in the left hand around the wrist joint.  He said he noticed a lesion on his hand today.  No shortness of breath or chest pain.   Objective:    Vitals:   09/24/20 0730 09/24/20 0800 09/24/20 0842 09/24/20 1133  BP: 122/72   119/67  Pulse: (!) 49  69 79  Resp: 17 12  20   Temp: 97.9 F (36.6 C)   98.3 F (36.8 C)  TempSrc:      SpO2: 97%   (!) 88%  Weight:      Height:       No data found.   Intake/Output Summary (Last 24 hours) at 09/24/2020 1214 Last data filed at 09/24/2020 1100 Gross per 24 hour  Intake 474.68 ml  Output 1525 ml  Net -1050.32 ml   Filed Weights   09/19/20 0641 09/21/20 0512 09/24/20 0133  Weight: 97.7 kg 99.5 kg 100.5 kg    Exam:   GEN: NAD SKIN: Tender black nodule with surrounding erythema on the medial border of the dorsal aspect of the left wrist joint. EYES: EOMI ENT: MMM CV: RRR PULM: CTA B ABD: soft, obese, NT, +BS CNS: AAO x 3, non focal EXT: No edema or tenderness       Data Reviewed:   I have personally reviewed following labs and imaging studies:  Labs: Labs show the following:   Basic Metabolic Panel: Recent Labs  Lab 09/18/20 0607 09/18/20 0607 09/19/20 0532 09/19/20 0532 09/20/20 09/22/20 09/20/20 09/22/20 09/21/20 2136  09/21/20 2136 09/22/20 0504 09/23/20 0347 09/24/20 0507  NA 135   < > 136  --  135  --  134*  --   --  134* 135  K 3.7   < > 3.8   < > 4.0   < > 4.2   < >  --  4.2 3.7  CL 100   < > 99  --  101  --  100  --   --  99 101  CO2 26   < > 28  --  26  --  24  --   --  24 25  GLUCOSE 91   < > 88  --  99  --  108*  --   --  99 100*  BUN 16   < > 18  --  19  --  23  --   --  34* 34*  CREATININE 0.81   < > 0.88   < > 0.85  --  1.14  --  1.56* 1.62* 1.48*  CALCIUM 8.4*   < > 8.8*  --  8.8*  --  8.4*  --   --  8.7* 8.4*  MG 2.4  --  2.5*  --   --   --   --   --   --  2.3 2.4  PHOS  --   --   --   --   --   --   --   --   --  3.0 3.2   < > = values in this interval not displayed.   GFR Estimated Creatinine Clearance: 42.5 mL/min (A) (by C-G  formula based on SCr of 1.48 mg/dL (H)). Liver Function Tests: No results for input(s): AST, ALT, ALKPHOS, BILITOT, PROT, ALBUMIN in the last 168 hours. No results for input(s): LIPASE, AMYLASE in the last 168 hours. No results for input(s): AMMONIA in the last 168 hours. Coagulation profile Recent Labs  Lab 09/20/20 0629 09/21/20 0351 09/22/20 0504 09/23/20 0347 09/24/20 0507  INR 1.2 1.4* 1.7* 2.4* 2.6*    CBC: Recent Labs  Lab 09/21/20 0351 09/21/20 1916 09/22/20 0504 09/23/20 0347 09/24/20 0507  WBC 5.6 16.5* 21.8* 8.7 6.6  NEUTROABS  --  15.1*  --  7.4  --   HGB 9.7* 10.3* 9.9* 9.7* 8.9*  HCT 30.5* 31.8* 32.0* 30.4* 28.1*  MCV 91.6 90.1 92.5 91.6 92.1  PLT 220 259 239 182 167   Cardiac Enzymes: No results for input(s): CKTOTAL, CKMB, CKMBINDEX, TROPONINI in the last 168 hours. BNP (last 3 results) No results for input(s): PROBNP in the last 8760 hours. CBG: Recent Labs  Lab 09/21/20 1839 09/24/20 1142  GLUCAP 101* 73   D-Dimer: No results for input(s): DDIMER in the last 72 hours. Hgb A1c: No results for input(s): HGBA1C in the last 72 hours. Lipid Profile: No results for input(s): CHOL, HDL, LDLCALC, TRIG, CHOLHDL,  LDLDIRECT in the last 72 hours. Thyroid function studies: No results for input(s): TSH, T4TOTAL, T3FREE, THYROIDAB in the last 72 hours.  Invalid input(s): FREET3 Anemia work up: No results for input(s): VITAMINB12, FOLATE, FERRITIN, TIBC, IRON, RETICCTPCT in the last 72 hours. Sepsis Labs: Recent Labs  Lab 09/21/20 1916 09/22/20 0504 09/22/20 1637 09/22/20 2002 09/23/20 0347 09/24/20 0507  PROCALCITON 0.38 15.34  --   --  13.03  --   WBC 16.5* 21.8*  --   --  8.7 6.6  LATICACIDVEN  --   --  3.6* 2.2*  --  1.1    Microbiology Recent Results (from the past 240 hour(s))  Urine Culture     Status: None   Collection Time: 09/21/20  7:00 PM   Specimen: Urine, Catheterized  Result Value Ref Range Status   Specimen Description   Final    URINE, CATHETERIZED Performed at [facility], [address], [place], [place] [address]    Special Requests   Final    NONE Performed at [facility], [address], [place], [place] [address]    Culture   Final    NO GROWTH Performed at [facility], [address], [place], [place] [address]    Report Status 09/23/2020 FINAL  Final  MRSA PCR Screening     Status: None   Collection Time: 09/21/20  7:00 PM   Specimen: Nasopharyngeal  Result Value Ref Range Status   MRSA by PCR NEGATIVE NEGATIVE Final    Comment:        The GeneXpert MRSA Assay (FDA approved for NASAL specimens only), is one component of a comprehensive MRSA colonization surveillance program. It is not intended to diagnose MRSA infection nor to guide or monitor treatment for MRSA infections. Performed at [facility], [address] [place], [place] [address]   CULTURE, BLOOD (ROUTINE X 2) w Reflex to ID Panel     Status: None (Preliminary result)   Collection Time: 09/21/20  9:36 PM   Specimen: BLOOD  Result Value Ref Range Status   Specimen Description BLOOD BLOOD LEFT HAND  Final   Special Requests   Final    BOTTLES  DRAWN AEROBIC AND ANAEROBIC  Blood Culture adequate volume   Culture   Final    NO GROWTH 3 DAYS Performed at [facility], [address] [place], [place] [address]    Report Status PENDING  Incomplete  CULTURE, BLOOD (ROUTINE X 2) w Reflex to ID Panel     Status: None (Preliminary result)   Collection Time: 09/21/20  9:36 PM   Specimen: BLOOD  Result Value Ref Range Status   Specimen Description BLOOD LEFT ANTECUBITAL  Final   Special Requests   Final    BOTTLES DRAWN AEROBIC ONLY Blood Culture results may not be optimal due to an inadequate volume of blood received in culture bottles   Culture   Final    NO GROWTH 3 DAYS Performed at [facility], [address] [place], [place] [address]    Report Status PENDING  Incomplete  Urine Culture     Status: None   Collection Time: 09/22/20  8:33 PM   Specimen: Urine, Random  Result Value Ref Range Status   Specimen Description   Final    URINE, RANDOM Performed at [facility], [address] [place], [place] [address]    Special Requests   Final    NONE Performed at [facility], [address] [place], [place] [address]    Culture   Final    NO GROWTH Performed at [facility], [address] [place], [place] [address]    Report Status 09/24/2020 FINAL  Final    Procedures and diagnostic studies:  DG Chest Port 1 View  Result Date: 09/23/2020 CLINICAL DATA:  Shortness of breath EXAM: PORTABLE CHEST 1 VIEW COMPARISON:  09/21/2020 FINDINGS: Mild cardiomegaly with hazy bilateral opacities. Small pleural effusions. Remote median sternotomy IMPRESSION: Mild pulmonary edema with mild cardiomegaly and small pleural effusions Electronically Signed   By: Deatra Robinson M.D.   On: 09/23/2020 06:31               LOS: 11 days   Lydell Moga  [facility]   Pager on [email or web address]. If 7PM-7AM, please contact night-coverage at [email or web address]     09/24/2020, 12:14  PM

## 2020-09-24 NOTE — Significant Event (Addendum)
Rapid Response Event Note   Reason for Call : called RR on pt with decreased 02 sats noted in 70's by staff.   Initial Focused Assessment: Pt laying in bed, no distress noted. NRB on 02 sats 100%.       Interventions: Dr Myriam Forehand at bedside to evaluate. Moved back to 3L [place] 02 sats still maintaining 100%. Cpap when sleeping.   Plan of Care: Edwina, RN- to call if further assistance needed.    Event Summary:   MD Notified: Myriam Forehand Call Time: 1142 Arrival Time:1145 End Time:1150  Andrew Soria A, RN

## 2020-09-24 NOTE — Progress Notes (Signed)
Pt oxygen saturations on 2L was sitting at 69-74%. Pt please patient on 15L high flow. Pt oxygen saturations was sitting at 74%. Respiratory, Rapid, charge nurse, and MD notified. Pt oxygen went up to 100% on 15L high flow. Respiratory decreased pt oxygen to 2-3L. Oxygen saturations maintained at 95-98%.

## 2020-09-24 NOTE — Progress Notes (Signed)
Mobility Specialist - Progress Note   09/24/20 1300  Mobility  Activity Contraindicated/medical hold  Mobility performed by Mobility specialist    Per discussion with nurse, mobility session on hold d/t low O2 sats this date. Will continue to monitor and attempt session as pt is medically appropriate to be seen.    Filiberto Pinks Mobility Specialist 09/24/20, 1:58 PM

## 2020-09-25 DIAGNOSIS — J9601 Acute respiratory failure with hypoxia: Secondary | ICD-10-CM | POA: Diagnosis not present

## 2020-09-25 DIAGNOSIS — K922 Gastrointestinal hemorrhage, unspecified: Secondary | ICD-10-CM | POA: Diagnosis not present

## 2020-09-25 DIAGNOSIS — I5032 Chronic diastolic (congestive) heart failure: Secondary | ICD-10-CM | POA: Diagnosis not present

## 2020-09-25 DIAGNOSIS — N179 Acute kidney failure, unspecified: Secondary | ICD-10-CM | POA: Diagnosis not present

## 2020-09-25 LAB — CBC WITH DIFFERENTIAL/PLATELET
Abs Immature Granulocytes: 0.03 10*3/uL (ref 0.00–0.07)
Basophils Absolute: 0 10*3/uL (ref 0.0–0.1)
Basophils Relative: 0 %
Eosinophils Absolute: 0.2 10*3/uL (ref 0.0–0.5)
Eosinophils Relative: 3 %
HCT: 31.9 % — ABNORMAL LOW (ref 39.0–52.0)
Hemoglobin: 10.2 g/dL — ABNORMAL LOW (ref 13.0–17.0)
Immature Granulocytes: 0 %
Lymphocytes Relative: 9 %
Lymphs Abs: 0.7 10*3/uL (ref 0.7–4.0)
MCH: 29.1 pg (ref 26.0–34.0)
MCHC: 32 g/dL (ref 30.0–36.0)
MCV: 90.9 fL (ref 80.0–100.0)
Monocytes Absolute: 0.6 10*3/uL (ref 0.1–1.0)
Monocytes Relative: 9 %
Neutro Abs: 5.6 10*3/uL (ref 1.7–7.7)
Neutrophils Relative %: 79 %
Platelets: 196 10*3/uL (ref 150–400)
RBC: 3.51 MIL/uL — ABNORMAL LOW (ref 4.22–5.81)
RDW: 17 % — ABNORMAL HIGH (ref 11.5–15.5)
WBC: 7.1 10*3/uL (ref 4.0–10.5)
nRBC: 0 % (ref 0.0–0.2)

## 2020-09-25 LAB — BLOOD GAS, ARTERIAL
Acid-Base Excess: 3.9 mmol/L — ABNORMAL HIGH (ref 0.0–2.0)
Bicarbonate: 27.6 mmol/L (ref 20.0–28.0)
FIO2: 0.21
O2 Saturation: 96.9 %
Patient temperature: 37
pCO2 arterial: 37 mmHg (ref 32.0–48.0)
pH, Arterial: 7.48 — ABNORMAL HIGH (ref 7.350–7.450)
pO2, Arterial: 83 mmHg (ref 83.0–108.0)

## 2020-09-25 LAB — BASIC METABOLIC PANEL
Anion gap: 11 (ref 5–15)
BUN: 32 mg/dL — ABNORMAL HIGH (ref 8–23)
CO2: 27 mmol/L (ref 22–32)
Calcium: 8.8 mg/dL — ABNORMAL LOW (ref 8.9–10.3)
Chloride: 101 mmol/L (ref 98–111)
Creatinine, Ser: 1.31 mg/dL — ABNORMAL HIGH (ref 0.61–1.24)
GFR, Estimated: 53 mL/min — ABNORMAL LOW (ref >60)
Glucose, Bld: 100 mg/dL — ABNORMAL HIGH (ref 70–99)
Potassium: 3.5 mmol/L (ref 3.5–5.1)
Sodium: 139 mmol/L (ref 135–145)

## 2020-09-25 LAB — PHOSPHORUS: Phosphorus: 2.5 mg/dL (ref 2.5–4.6)

## 2020-09-25 LAB — PROTIME-INR
INR: 2.4 — ABNORMAL HIGH (ref 0.8–1.2)
Prothrombin Time: 25.2 seconds — ABNORMAL HIGH (ref 11.4–15.2)

## 2020-09-25 LAB — BRAIN NATRIURETIC PEPTIDE: B Natriuretic Peptide: 980 pg/mL — ABNORMAL HIGH (ref 0.0–100.0)

## 2020-09-25 LAB — MAGNESIUM: Magnesium: 2.3 mg/dL (ref 1.7–2.4)

## 2020-09-25 MED ORDER — MORPHINE SULFATE (PF) 2 MG/ML IV SOLN
2.0000 mg | INTRAVENOUS | Status: AC | PRN
  Administered 2020-09-25 – 2020-09-26 (×2): 2 mg via INTRAVENOUS
  Filled 2020-09-25 (×2): qty 1

## 2020-09-25 MED ORDER — FUROSEMIDE 10 MG/ML IJ SOLN
40.0000 mg | Freq: Two times a day (BID) | INTRAMUSCULAR | Status: DC
  Administered 2020-09-25 (×2): 40 mg via INTRAVENOUS
  Filled 2020-09-25: qty 4

## 2020-09-25 MED ORDER — WARFARIN SODIUM 3 MG PO TABS
3.0000 mg | ORAL_TABLET | Freq: Once | ORAL | Status: AC
  Administered 2020-09-25: 3 mg via ORAL
  Filled 2020-09-25: qty 1

## 2020-09-25 NOTE — Care Management Important Message (Signed)
Important Message  Patient Details  Name: [name] MRN: [id number] Date of Birth: 1931-04-27   Medicare Important Message Given:  Yes     [name] 09/25/2020, 12:17 PM

## 2020-09-25 NOTE — Progress Notes (Signed)
Mobility Specialist - Progress Note   09/25/20 1222  Mobility  Activity Contraindicated/medical hold  Mobility performed by Mobility specialist    Per discussion w/ nurse, pt had just returned from procedure. Suggests pt rest at this time. Will hold and re-attempt session at a later date/time when pt is appropriate.     Nekeisha Aure Mobility Specialist  09/25/20, 12:24 PM

## 2020-09-25 NOTE — Progress Notes (Signed)
Physical Therapy Treatment Patient Details Name: [name] MRN: [id number] DOB: 1931/10/04 Today's Date: 09/25/2020    History of Present Illness Patient is an 86 year old male who presented to ED for weakness and low hemoglobin; admitted for management of GIB with symptomatic anemia. PMh includes aortic stenosis s/p mechanical AVR on coumadin, chronic diastolic HF, HTN, CAD, chronic venous stasis, chronic anemia, GERD, C diff, GI hemorrhage (sept 2021), anxiety, bradycardia, hearin glost, RLS. Rapid response called on 10/28.  Patient was confused, tachycardic, hypertensive and hypoxemic.  Place on HFCL then BiPAP for increased work of breathing. He devlovoped a fever with temperature of 101.7 and was transfered to the ICU.     PT Comments    Pt received lying in bed after just having been cleaned up by nurse tech. Pt agreeable to participate in PT session. Pt self initiated supine to sit transfer and required CGA to come from mid to full upright sitting with heavy UE usage. Pt performed sit <> stand transfer to RW from elevated height of bed with min A for boosting hips and slight anterior weight shift. Pt performed several times requesting to attempt without assistance then asking for help when unable to do it alone. Pt performed standing marches prior to taking several steps forward and backward then proceeded to ambulate further in room using RW with CGA. Pt ambulated side to side using RW and CGA for hip strengthening. Pillows placed in chair to elevate height of chair. Pt sat to recliner chair and attempted 2 sit <> stands with heavy BUE usage on armrests requiring mod A secondary to fatigue. Pt then performed BLE therex and left with BLE elevated. Pt remained on O2 throughout session. Clinician had difficulty getting SpO2 reading due to pt's very cold hands however nurse arrived and lowered O2 to 2L stating that his oxygen blood test came back good. Pt left with all needs within reach  and daughter at bedside. Pt making good progress since previous session. Continue to recommend STR to optimize return to PLOF.     Follow Up Recommendations  SNF     Equipment Recommendations  Rolling walker with 5" wheels    Recommendations for Other Services       Precautions / Restrictions Precautions Precautions: Fall Precaution Comments: watch O2 sats Restrictions Weight Bearing Restrictions: No    Mobility  Bed Mobility Overal bed mobility: Needs Assistance Bed Mobility: Supine to Sit     Supine to sit: HOB elevated;Min guard Sit to supine: Min guard;HOB elevated   General bed mobility comments: CGA for truncal elevation  Transfers Overall transfer level: Needs assistance Equipment used: Rolling walker (2 wheeled) Transfers: Sit to/from Stand Sit to Stand: Min assist;Mod assist;From elevated surface         General transfer comment: Min A for sit <> stand from elevated bed for boosting hips and slight anterior weight shift; mod A to stand once pt fatigued  Ambulation/Gait Ambulation/Gait assistance: Min guard Gait Distance (Feet): 30 Feet Assistive device: Rolling walker (2 wheeled) Gait Pattern/deviations: Step-through pattern;Decreased step length - right;Decreased step length - left Gait velocity: decreased   General Gait Details: decreased step lengths bilaterally; guarded gait and decreased speed for safety; CGA and use of RW for steadying   Stairs             Wheelchair Mobility    Modified Rankin (Stroke Patients Only)       Balance Overall balance assessment: Needs assistance Sitting-balance support: Bilateral upper  extremity supported;Feet supported Sitting balance-Leahy Scale: Fair Sitting balance - Comments: no overt LOB; SBA for safety   Standing balance support: During functional activity;Bilateral upper extremity supported Standing balance-Leahy Scale: Fair Standing balance comment: CGA for safety while reliant on RW                             Cognition Arousal/Alertness: Awake/alert Behavior During Therapy: WFL for tasks assessed/performed Overall Cognitive Status: Difficult to assess                                 General Comments: Pt would state things like thinking he saw a curled up cat in the room and asking if [facility] was outside the window. Daughter present to reorient.      Exercises Other Exercises Other Exercises: sit <> stand x 5 with min-mod A and RW Other Exercises: seated therex: alternating marches x 10 bilat and LAQ x 15 bilat    General Comments        Pertinent Vitals/Pain Pain Assessment: No/denies pain    Home Living                      Prior Function            PT Goals (current goals can now be found in the care plan section) Acute Rehab PT Goals Patient Stated Goal: To return home  PT Goal Formulation: With patient Time For Goal Achievement: 09/30/20 Potential to Achieve Goals: Fair Progress towards PT goals: Progressing toward goals    Frequency    Min 2X/week      PT Plan Current plan remains appropriate    Co-evaluation              AM-PAC PT "6 Clicks" Mobility   Outcome Measure  Help needed turning from your back to your side while in a flat bed without using bedrails?: A Little Help needed moving from lying on your back to sitting on the side of a flat bed without using bedrails?: A Little Help needed moving to and from a bed to a chair (including a wheelchair)?: A Lot Help needed standing up from a chair using your arms (e.g., wheelchair or bedside chair)?: A Lot Help needed to walk in hospital room?: A Little Help needed climbing 3-5 steps with a railing? : A Lot 6 Click Score: 15    End of Session Equipment Utilized During Treatment: Oxygen Activity Tolerance: Patient limited by fatigue;Patient tolerated treatment well Patient left: in chair;with call bell/phone within reach;with chair alarm  set;with family/visitor present Nurse Communication: Mobility status PT Visit Diagnosis: Muscle weakness (generalized) (M62.81);Difficulty in walking, not elsewhere classified (R26.2)     Time: 1275-1700 PT Time Calculation (min) (ACUTE ONLY): 41 min  Charges:                         Frederich Chick, SPT   Frederich Chick 09/25/2020, 5:03 PM

## 2020-09-25 NOTE — Progress Notes (Addendum)
Progress Note    Vito BergerSydnor Arnold Fiorenza  LKG:[id number] DOB: 14-Sep-1931  DOA: 09/13/2020 PCP: Gracelyn NurseJohnston, [name], MD      Brief Narrative:    Medical records reviewed and are as summarized below:  Grafton Jennefer Bravornold Tencza is a 86 y.o. male with medical history significant for aortic stenosis status post mechanical aortic valve replacement on Coumadin, permanent atrial fibrillation, chronic diastolic CHF, hypertension, CAD, chronic venous stasis, chronic anemia, GERD, history of C. difficile infection, recent GI bleed in September 2021) severe anemia with hemoglobin of 4.  He was referred to the emergency room at the behest of his primary care physician because of generalized weakness, lightheadedness and anemia with hemoglobin of 7.4.  He was admitted to the hospital for acute blood loss anemia likely from GI bleed.    Assessment/Plan:   Principal Problem:   Upper GI bleeding Active Problems:   Hypertension   Coronary artery disease   S/P AVR   Warfarin anticoagulation   History of mechanical aortic valve replacement   Chronic diastolic CHF (congestive heart failure) (HCC)   AKI (acute kidney injury) (HCC)   Permanent atrial fibrillation (HCC)   Acute blood loss anemia   History of lower GI bleeding   Symptomatic anemia   Fever   Acute respiratory failure with hypoxia (HCC)   Body mass index is 30.91 kg/m.  (Obesity)  Recent fever with T-max of 101.7 on 09/21/2020, suspected septic shock from ? aspiration pneumonia, leukocytosis, lactic acidosis, elevated procalcitonin at 15.34:   No growth on blood cultures thus far.  Continue Augmentin. Abnormal urinalysis with pyuria but urine culture did not show any growth.     Acute kidney injury, recent hypotension: BP stable.  Creatinine is trending down.  Continue to monitor.      Acute exacerbation of chronic diastolic CHF: He was given IV Lasix 09/24/2020 for acute exacerbation.  Continue IV Lasix.  2D echo showed EF  estimated at 55 to 60%, grade 2 diastolic dysfunction,  Worsening acute hypoxic respiratory failure: He is requiring 5 L/min oxygen via nasal cannula.  There was concern that pulse oximetry is not picking up the right oxygen saturation.  ABG has been ordered to check PO2 and oxygen saturation on room air.  Delirium/acute metabolic encephalopathy: Resolved.  Acute blood loss anemia secondary to GI bleeding: s/p capsule endoscopy on 09/16/2019 which showed copious amount of coffee-ground liquid in the entire small bowel and likely small bowel evaluations.  S/p small bowel enteroscopy on 09/17/2020.  This showed nonbleeding duodenal ulcers with a clean ulcer base and "recent bleeding erosive to adenopathy treated with argon beam coagulation.  S/p blood transfusion with 1 unit of PRBCs on 09/13/2020. Continue Protonix.  H&H is stable.  Permanent atrial fibrillation, status post mechanical aortic valve replacement: INR is 2.4.  Continue Coumadin and monitor INR.    CAD: Continue aspirin, Coumadin and metoprolol  Anxiety: Continue BuSpar.  Xanax as needed.  IV morphine as needed for anxiety/air hunger.  Tender black lesion (nodule) on the medial border of the dorsal aspect of the left wrist joint: Resolved.  This was probably a hemorrhagic bulla.  Plan of care was discussed with his daughter at the bedside.  Patient and his daughter understand that kidney function may worsen with IV diuresis.    Diet Order            Diet Heart Room service appropriate? Yes; Fluid consistency: Thin  Diet effective now  Consultants:  Gastroenterologist  Cardiologist  Procedures:  Small bowel endoscopy    Medications:   . allopurinol  300 mg Oral Daily  . amoxicillin-clavulanate  1 tablet Oral Q12H  . busPIRone  5 mg Oral BID  . chlorhexidine  15 mL Mouth Rinse BID  . Chlorhexidine Gluconate Cloth  6 each Topical Daily  . docusate sodium  200 mg Oral BID  . DULoxetine   30 mg Oral Daily  . finasteride  5 mg Oral Daily  . furosemide  40 mg Intravenous BID  . lidocaine  1 patch Transdermal Q24H  . mouth rinse  15 mL Mouth Rinse q12n4p  . metoprolol succinate  12.5 mg Oral Daily  . pantoprazole  40 mg Oral BID AC  . polyethylene glycol  17 g Oral Daily  . rOPINIRole  1 mg Oral Daily  . rOPINIRole  2 mg Oral TID  . torsemide  40 mg Oral Daily  . traZODone  50 mg Oral QHS  . warfarin  3 mg Oral ONCE-1600  . Warfarin - Pharmacist Dosing Inpatient   Does not apply q1600   Continuous Infusions: . sodium chloride       Anti-infectives (From admission, onward)   Start     Dose/Rate Route Frequency Ordered Stop   09/24/20 1000  amoxicillin-clavulanate (AUGMENTIN) 875-125 MG per tablet 1 tablet        1 tablet Oral Every 12 hours 09/24/20 0806     09/23/20 1000  vancomycin (VANCOREADY) IVPB 1250 mg/250 mL  Status:  Discontinued        1,250 mg 166.7 mL/hr over 90 Minutes Intravenous Every 24 hours 09/22/20 1251 09/22/20 1955   09/22/20 2200  ceFEPIme (MAXIPIME) 2 g in sodium chloride 0.9 % 100 mL IVPB  Status:  Discontinued        2 g 200 mL/hr over 30 Minutes Intravenous Every 12 hours 09/22/20 1251 09/24/20 0806   09/22/20 0800  vancomycin (VANCOREADY) IVPB 1250 mg/250 mL  Status:  Discontinued        1,250 mg 166.7 mL/hr over 90 Minutes Intravenous Every 12 hours 09/21/20 1918 09/22/20 1251   09/21/20 2200  ceFEPIme (MAXIPIME) 1 g in sodium chloride 0.9 % 100 mL IVPB  Status:  Discontinued        1 g 200 mL/hr over 30 Minutes Intravenous Every 8 hours 09/21/20 1905 09/22/20 1251   09/21/20 2015  vancomycin (VANCOREADY) IVPB 2000 mg/400 mL        2,000 mg 200 mL/hr over 120 Minutes Intravenous  Once 09/21/20 1918 09/21/20 2319             Family Communication/Anticipated D/C date and plan/Code Status   DVT prophylaxis: SCDs Start: 09/13/20 1948     Code Status: Full Code  Family Communication:  Disposition Plan:    Status is:  Inpatient  Remains inpatient appropriate because:IV treatments appropriate due to intensity of illness or inability to take PO and Mechanical aortic valve with subtherapeutic INR   Dispo: The patient is from: Home              Anticipated d/c is to: SNF              Anticipated d/c date is: 2 days              Patient currently is not medically stable to d/c.           Subjective:   Interval events noted.  He complains of  shortness of breath and anxiety.  His daughter, Cala Bradford, is at the bedside.   Objective:    Vitals:   09/24/20 2105 09/25/20 0332 09/25/20 0720 09/25/20 1121  BP:  140/77 101/65 (!) 151/87  Pulse:  80 (!) 52 86  Resp:  Temp:  97.7 F (36.5 C) 98.3 F (36.8 C) 97.9 F (36.6 C)  TempSrc:      SpO2: 94% (!) 62% (!) 86% 90%  Weight:      Height:       No data found.   Intake/Output Summary (Last 24 hours) at 09/25/2020 1409 Last data filed at 09/25/2020 1610 Gross per 24 hour  Intake 360 ml  Output 2200 ml  Net -1840 ml   Filed Weights   09/19/20 0641 09/21/20 0512 09/24/20 0133  Weight: 97.7 kg 99.5 kg 100.5 kg    Exam:  GEN: NAD SKIN: Black nodule/bulla on the medial border of the left wrist joint has ruptured EYES: EOMI ENT: MMM CV: RRR PULM: Bilateral expiratory wheezing and bibasilar rales. ABD: soft, obese, NT, +BS CNS: AAO x 3, non focal EXT: No edema or tenderness        Data Reviewed:   I have personally reviewed following labs and imaging studies:  Labs: Labs show the following:   Basic Metabolic Panel: Recent Labs  Lab 09/19/20 0532 09/19/20 0532 09/20/20 9604 09/20/20 5409 09/21/20 2136 09/21/20 2136 09/22/20 0504 09/23/20 0347 09/23/20 0347 09/24/20 0507 09/25/20 0424  NA 136   < > 135  --  134*  --   --  134*  --  135 139  K 3.8   < > 4.0   < > 4.2   < >  --  4.2   < > 3.7 3.5  CL 99   < > 101  --  100  --   --  99  --  101 101  CO2 28   < > 26  --  24  --   --  24  --  25 27   GLUCOSE 88   < > 99  --  108*  --   --  99  --  100* 100*  BUN 18   < > 19  --  23  --   --  34*  --  34* 32*  CREATININE 0.88   < > 0.85   < > 1.14  --  1.56* 1.62*  --  1.48* 1.31*  CALCIUM 8.8*   < > 8.8*  --  8.4*  --   --  8.7*  --  8.4* 8.8*  MG 2.5*  --   --   --   --   --   --  2.3  --  2.4 2.3  PHOS  --   --   --   --   --   --   --  3.0  --  3.2 2.5   < > = values in this interval not displayed.   GFR Estimated Creatinine Clearance: 48 mL/min (A) (by C-G formula based on SCr of 1.31 mg/dL (H)). Liver Function Tests: No results for input(s): AST, ALT, ALKPHOS, BILITOT, PROT, ALBUMIN in the last 168 hours. No results for input(s): LIPASE, AMYLASE in the last 168 hours. No results for input(s): AMMONIA in the last 168 hours. Coagulation profile Recent Labs  Lab 09/21/20 0351 09/22/20 0504 09/23/20 0347 09/24/20 0507 09/25/20 0424  INR 1.4* 1.7* 2.4* 2.6* 2.4*  CBC: Recent Labs  Lab 09/21/20 1916 09/22/20 0504 09/23/20 0347 09/24/20 0507 09/25/20 0424  WBC 16.5* 21.8* 8.7 6.6 7.1  NEUTROABS 15.1*  --  7.4  --  5.6  HGB 10.3* 9.9* 9.7* 8.9* 10.2*  HCT 31.8* 32.0* 30.4* 28.1* 31.9*  MCV 90.1 92.5 91.6 92.1 90.9  PLT 259 239 182 167 196   Cardiac Enzymes: No results for input(s): CKTOTAL, CKMB, CKMBINDEX, TROPONINI in the last 168 hours. BNP (last 3 results) No results for input(s): PROBNP in the last 8760 hours. CBG: Recent Labs  Lab 09/21/20 1839 09/24/20 1142  GLUCAP 101* 73   D-Dimer: No results for input(s): DDIMER in the last 72 hours. Hgb A1c: No results for input(s): HGBA1C in the last 72 hours. Lipid Profile: No results for input(s): CHOL, HDL, LDLCALC, TRIG, CHOLHDL, LDLDIRECT in the last 72 hours. Thyroid function studies: No results for input(s): TSH, T4TOTAL, T3FREE, THYROIDAB in the last 72 hours.  Invalid input(s): FREET3 Anemia work up: No results for input(s): VITAMINB12, FOLATE, FERRITIN, TIBC, IRON, RETICCTPCT in the last 72  hours. Sepsis Labs: Recent Labs  Lab 09/21/20 1916 09/21/20 1916 09/22/20 0504 09/22/20 1637 09/22/20 2002 09/23/20 0347 09/24/20 0507 09/25/20 0424  PROCALCITON 0.38  --  15.34  --   --  13.03  --   --   WBC 16.5*   < > 21.8*  --   --  8.7 6.6 7.1  LATICACIDVEN  --   --   --  3.6* 2.2*  --  1.1  --    < > = values in this interval not displayed.    Microbiology Recent Results (from the past 240 hour(s))  Urine Culture     Status: None   Collection Time: 09/21/20  7:00 PM   Specimen: Urine, Catheterized  Result Value Ref Range Status   Specimen Description   Final    URINE, CATHETERIZED Performed at [facility], [address] [place], [place] [address]    Special Requests   Final    NONE Performed at [facility], [address], [place], [place] [address]    Culture   Final    NO GROWTH Performed at [facility], [address] [place], [place] [address]    Report Status 09/23/2020 FINAL  Final  MRSA PCR Screening     Status: None   Collection Time: 09/21/20  7:00 PM   Specimen: Nasopharyngeal  Result Value Ref Range Status   MRSA by PCR NEGATIVE NEGATIVE Final    Comment:        The GeneXpert MRSA Assay (FDA approved for NASAL specimens only), is one component of a comprehensive MRSA colonization surveillance program. It is not intended to diagnose MRSA infection nor to guide or monitor treatment for MRSA infections. Performed at [facility], [address] [place], [place] [address]   CULTURE, BLOOD (ROUTINE X 2) w Reflex to ID Panel     Status: None (Preliminary result)   Collection Time: 09/21/20  9:36 PM   Specimen: BLOOD  Result Value Ref Range Status   Specimen Description BLOOD BLOOD LEFT HAND  Final   Special Requests   Final    BOTTLES DRAWN AEROBIC AND ANAEROBIC Blood Culture adequate volume   Culture   Final    NO GROWTH 4 DAYS Performed at [facility], [address], [place], [place]  [address]    Report Status PENDING  Incomplete  CULTURE, BLOOD (ROUTINE X 2)  w Reflex to ID Panel     Status: None (Preliminary result)   Collection Time: 09/21/20  9:36 PM   Specimen: BLOOD  Result Value Ref Range Status   Specimen Description BLOOD LEFT ANTECUBITAL  Final   Special Requests   Final    BOTTLES DRAWN AEROBIC ONLY Blood Culture results may not be optimal due to an inadequate volume of blood received in culture bottles   Culture   Final    NO GROWTH 4 DAYS Performed at [facility], [address], [place], [place] [address]    Report Status PENDING  Incomplete  Urine Culture     Status: None   Collection Time: 09/22/20  8:33 PM   Specimen: Urine, Random  Result Value Ref Range Status   Specimen Description   Final    URINE, RANDOM Performed at [facility], [address] [place], [place] [address]    Special Requests   Final    NONE Performed at [facility], [address], [place], [place] [address]    Culture   Final    NO GROWTH Performed at [facility], [address] [place], [place] [address]    Report Status 09/24/2020 FINAL  Final    Procedures and diagnostic studies:  No results found.             LOS: 12 days   Gladie Gravette  [employer] Chartered loss adjuster on [email or web address]. If 7PM-7AM, please contact night-coverage at [email or web address]     09/25/2020, 2:09 PM

## 2020-09-25 NOTE — Consult Note (Signed)
ANTICOAGULATION CONSULT NOTE   Pharmacy Consult for Heparin and warfarin Indication: atrial fibrillation and mAVR  Patient Measurements: Height: 5\' 11"  (180.3 cm) Weight: 100.5 kg (221 lb 9.6 oz) IBW/kg (Calculated) : 75.3 Heparin Dosing Weight: 94.9 kg  Vital Signs: Temp: 98.3 F (36.8 C) (11/01 0720) Temp Source: Oral (10/31 2039) BP: 101/65 (11/01 0720) Pulse Rate: 52 (11/01 0720)  Labs: Recent Labs    09/23/20 0347 09/23/20 0347 09/24/20 0507 09/25/20 0424  HGB 9.7*   < > 8.9* 10.2*  HCT 30.4*  --  28.1* 31.9*  PLT 182  --  167 196  LABPROT 25.7*  --  26.7* 25.2*  INR 2.4*  --  2.6* 2.4*  HEPARINUNFRC 0.27*  --  0.37  --   CREATININE 1.62*  --  1.48* 1.31*   < > = values in this interval not displayed.    Estimated Creatinine Clearance: 48 mL/min (A) (by C-G formula based on SCr of 1.31 mg/dL (H)).   Medical History: Past Medical History:  Diagnosis Date  . Anxiety 10/11  . Aortic stenosis    a. s/p mechcanical AVR, 2002; b. 10/2018 Echo: Triv AI, mean grad 11/2018.  . Bradycardia    chronic, no symptoms 07/2010  . C. difficile colitis   . Carotid bruit    dopplers in past, no abnormalities  . Chronic diastolic CHF (congestive heart failure) (HCC)    a. Echo 03/2015: EF 60-65%, no RWMA, GR1DD, mild BAE, mild to mod MR, mod TR, PASP 65 mmHg; b. 01/2017 Echo: EF 55-60%, NRWMA, grade 1 diastolic dysfunction.  Normal functioning prosthetic aortic valve.  Mean gradient 50 mmHg.  Sev TR. PASP 02/2017; c. 10/2018 Echo: EF 55-60%, Triv AI, mod dil LA, mod-sev TR, PASP 35-40, mild to mod red RV fxn.  . Coronary artery disease    a. mild, cath, 08/2010; b. medically managed  . Decreased hearing    Right ear  . Depression   . Gastric ulcer   . GERD (gastroesophageal reflux disease)   . Hypertension    BP higher than usual 04/19/10; amlodipine increased by telephone  . Mod-Sev Tricuspid regurgitation    a. 10/2018 Echo: Mod-Sev TR, PASP 35-37mmHg.  43m RLS (restless legs  syndrome) 08/23/2015  . S/P AVR    a. [employer]. mechanical 2002; b. echo 08/2010 EF 60%, trival AI, mild MR, AVR working well; c. on longterm warfarin tx  . SOB (shortness of breath) 10/11   08/2010,Episodes at 5 AM, eventually felt to be anxiety, after complete workup including catheterization, pt greatly improved with anxiety meds 11/11    Assessment: Pharmacy was consulted to start heparin and restart warfarin for mAVR and afib. INR 1.4 on admission. Hgb 9.7, Plt 220.  Provider wants to continue heparin bridge until INR is therapeutic. Pt has history of GI bleeds. GI cleared patient to restart anticoagulation. NO major DDI with warfarin. Pt is on augmentin 875 mg BID.   Home dose warfarin:  warfarin 2.5 mg MTuThFSaSu and 3.75 mg on Wednesday  warfarin course:  Date                            INR                              Dose 10/20  1.4                               NONE 10/25                                                               2.5 mg 10/26                           1.2                               4 mg 10/27                           1.2   4 mg                                  10/28                           1.4                               5 mg 10/29                           1.7                               4 mg 10/30                           2.4                               1 mg  10/31   2.6        2.5 mg 11/1   2.4   3 mg  Goals of Therapy:  INR 2.5-3.5 (per Coumadin clinic) Monitor platelets by anticoagulation protocol: Yes   Plan:  INR is slightly therapeutic. Will give INR 3 mg, which is slightly higher than home regimen. Predict INR will be therapeutic tomorrow. Daily INR ordered. CBC every 3 days.   Discharge recommendation: Continue home warfarin regimen:   warfarin 2.5 mg MTuThFSaSu and 3.75 mg on Wed.   And follow up with warfarin clinic in 2-3 days.   Ronnald Ramp, PharmD, BCPS 09/25/2020,8:31 AM

## 2020-09-25 NOTE — Progress Notes (Signed)
OT Cancellation Note  Patient Details Name: [name] MRN: [id number] DOB: [birth year]   Cancelled Treatment:    Reason Eval/Treat Not Completed: Fatigue/lethargy limiting ability to participate. Pt noted to have had RR yesterday 2/2 low O2 stats. Pt recently returned from procedure citing fatigue, will hold this AM and continue to follow POC as able.   Kathie Dike, M.S. OTR/L  09/25/20, 12:36 PM  ascom [phone]

## 2020-09-26 ENCOUNTER — Encounter
Admission: RE | Admit: 2020-09-26 | Discharge: 2020-09-26 | Disposition: A | Discharge status: Home / Self Care | Payer: Medicare Other | Source: Ambulatory Visit | Attending: Internal Medicine | Admitting: Internal Medicine

## 2020-09-26 DIAGNOSIS — Z7189 Other specified counseling: Secondary | ICD-10-CM

## 2020-09-26 DIAGNOSIS — Z515 Encounter for palliative care: Secondary | ICD-10-CM

## 2020-09-26 DIAGNOSIS — J9621 Acute and chronic respiratory failure with hypoxia: Secondary | ICD-10-CM

## 2020-09-26 LAB — BASIC METABOLIC PANEL
Anion gap: 11 (ref 5–15)
BUN: 28 mg/dL — ABNORMAL HIGH (ref 8–23)
CO2: 29 mmol/L (ref 22–32)
Calcium: 8.8 mg/dL — ABNORMAL LOW (ref 8.9–10.3)
Chloride: 97 mmol/L — ABNORMAL LOW (ref 98–111)
Creatinine, Ser: 1.08 mg/dL (ref 0.61–1.24)
GFR, Estimated: >60 mL/min (ref >60)
Glucose, Bld: 101 mg/dL — ABNORMAL HIGH (ref 70–99)
Potassium: 3.7 mmol/L (ref 3.5–5.1)
Sodium: 137 mmol/L (ref 135–145)

## 2020-09-26 LAB — CULTURE, BLOOD (ROUTINE X 2)
Culture: NO GROWTH
Culture: NO GROWTH
Special Requests: ADEQUATE

## 2020-09-26 LAB — PROTIME-INR
INR: 2.8 — ABNORMAL HIGH (ref 0.8–1.2)
Prothrombin Time: 28.6 seconds — ABNORMAL HIGH (ref 11.4–15.2)

## 2020-09-26 LAB — MAGNESIUM: Magnesium: 2.1 mg/dL (ref 1.7–2.4)

## 2020-09-26 LAB — PHOSPHORUS: Phosphorus: 2.7 mg/dL (ref 2.5–4.6)

## 2020-09-26 LAB — SARS CORONAVIRUS 2 BY RT PCR (HOSPITAL ORDER, PERFORMED IN ~~LOC~~ HOSPITAL LAB): SARS Coronavirus 2: NEGATIVE

## 2020-09-26 MED ORDER — ALPRAZOLAM 0.25 MG PO TABS: 0.2500 mg | ORAL_TABLET | Freq: Every day | ORAL | 0 refills | Status: AC | PRN

## 2020-09-26 MED ORDER — TORSEMIDE 20 MG PO TABS: 40.0000 mg | ORAL_TABLET | Freq: Two times a day (BID) | ORAL | Status: AC

## 2020-09-26 MED ORDER — WARFARIN SODIUM 2.5 MG PO TABS
2.5000 mg | ORAL_TABLET | Freq: Once | ORAL | Status: AC
  Administered 2020-09-26: 2.5 mg via ORAL
  Filled 2020-09-26: qty 1

## 2020-09-26 MED ORDER — WARFARIN SODIUM 2.5 MG PO TABS
2.5000 mg | ORAL_TABLET | Freq: Every day | ORAL | Status: DC
Start: 2020-09-26 — End: 2020-10-30

## 2020-09-26 NOTE — Progress Notes (Signed)
[place] called to [place] [facility].

## 2020-09-26 NOTE — Discharge Summary (Addendum)
Physician Discharge Summary  [name] BergerSydnor Arnold Disch WUJ:[id number] DOB: 12-12-1930 DOA: 09/13/2020  PCP: Gracelyn NurseJohnston, [name], MD  Admit date: 09/13/2020 Discharge date: 09/26/2020  Discharge disposition: Skilled nursing facility   Recommendations for Outpatient Follow-Up:   Follow-up with physician at the nursing home within 3 days of discharge Follow-up with cardiologist within 1 to 2 weeks of discharge Monitor INR closely and adjust Coumadin level accordingly.   Discharge Diagnosis:   Principal Problem:   Upper GI bleeding Active Problems:   Hypertension   Coronary artery disease   S/P AVR   Warfarin anticoagulation   History of mechanical aortic valve replacement   Chronic diastolic CHF (congestive heart failure) (HCC)   AKI (acute kidney injury) (HCC)   Permanent atrial fibrillation (HCC)   Acute blood loss anemia   History of lower GI bleeding   Symptomatic anemia   Fever   Acute on chronic respiratory failure with hypoxia (HCC)    Discharge Condition: Stable.  Diet recommendation:  Diet Order            Diet - low sodium heart healthy           Diet Heart Room service appropriate? Yes; Fluid consistency: Thin  Diet effective now                   Code Status: Full Code     Hospital Course:   [name] is a 86 y.o. male with medical history significant for aortic stenosis status post mechanical aortic valve replacement on Coumadin, permanent atrial fibrillation, chronic diastolic CHF, hypertension, CAD, chronic venous stasis, chronic anemia, GERD, history of C. difficile infection, recent GI bleed in September 2021) severe anemia with hemoglobin of 4.  He was referred to the emergency room at the behest of his primary care physician because of generalized weakness, lightheadedness and anemia with hemoglobin of 7.4.  He was admitted to the hospital for acute blood loss anemia likely from GI bleed.  He underwent capsule endoscopy on  09/16/2019 which showed copious amount of coffee-ground liquid in the entire small bowel and likely small bowel erosions.  Small bowel endoscopy was performed on 09/17/2020 and this showed nonbleeding duodenal ulcers with a clean ulcer base and recent bleeding erosive to duodenopathy treated with argon beam coagulation.  He was treated with IV Protonix and was given 1 unit of packed red blood cells.  Coumadin was held initially because of anemia and GI bleed.  Coumadin was restarted with IV heparin infusion bridge until INR was therapeutic.  Hospital course was complicated by septic shock from ? aspiration pneumonia complicated by acute hypoxemic respiratory failure, acute metabolic encephalopathy and acute kidney injury.  He was transferred to the ICU where he was treated with vasopressors and BiPAP.  He was also treated with empiric IV antibiotics and was switched to Augmentin.  He required IV Lasix for acute exacerbation of chronic diastolic CHF.  He was evaluated by PT and OT who recommended further rehabilitation at a skilled nursing facility.  Patient has poor circulation and cold extremities and and that sometimes makes pulse oximeter reading inaccurate.  ABG was obtained on room air and it showed PO2 of 83 and oxygen saturation of 96.9%.  On the day of discharge, oxygen saturation at rest was greater than 90% but oxygen saturation with short ambulation was 87% on room air.  Patient will benefit from 2 L/min oxygen with activity.  He is deemed stable for discharge to SNF today.  Discharge plan was discussed with the patient and his daughter, Cala Bradford, at the bedside.     Medical Consultants:    Intensivist   Discharge Exam:    Vitals:   09/25/20 1617 09/25/20 1944 09/26/20 0416 09/26/20 0728  BP: 124/60 (!) 150/74 121/81 122/62  Pulse: 73 71 90 75  Resp: 17   17  Temp: (!) 97.4 F (36.3 C) (!) 97.5 F (36.4 C) 97.7 F (36.5 C) 97.9 F (36.6 C)  TempSrc: Oral Oral Oral   SpO2: (!)  89% 94% 92% 92%  Weight:   94.7 kg   Height:         GEN: NAD SKIN: Small wound on the medial border of the left wrist joint (dorsal aspect) EYES: No pallor or icterus ENT: MMM CV: RRR PULM: CTA B ABD: soft, obese, NT, +BS CNS: AAO x 3, non focal EXT: No edema or tenderness   The results of significant diagnostics from this hospitalization (including imaging, microbiology, ancillary and laboratory) are listed below for reference.     Procedures and Diagnostic Studies:   DG Chest Port 1 View  Result Date: 09/23/2020 CLINICAL DATA:  Shortness of breath EXAM: PORTABLE CHEST 1 VIEW COMPARISON:  09/21/2020 FINDINGS: Mild cardiomegaly with hazy bilateral opacities. Small pleural effusions. Remote median sternotomy IMPRESSION: Mild pulmonary edema with mild cardiomegaly and small pleural effusions Electronically Signed   By: Deatra Robinson M.D.   On: 09/23/2020 06:31   ECHOCARDIOGRAM COMPLETE  Result Date: 09/22/2020    ECHOCARDIOGRAM REPORT   Patient Name:   [name] [facility] Date of Exam: 09/22/2020 Medical Rec #:  [id number]            Height:       71.0 in Accession #:    [id number]           Weight:       219.4 lb Date of Birth:  [birth year]           BSA:          2.193 m Patient Age:    86 years             BP:           102/36 mmHg Patient Gender: M                    HR:           70 bpm. Exam Location:  [facility] Procedure: 2D Echo, Color Doppler and Cardiac Doppler Indications:     R78.81 Bacteremia  History:         Patient has prior history of Echocardiogram examinations, most                  recent 08/06/2020. CHF, CAD; Risk Factors:Hypertension. S/P AVR.  Sonographer:     Humphrey Rolls RDCS (AE) Referring Phys:  [id number] Elyn Aquas Diagnosing Phys: Lorine Bears MD  Sonographer Comments: Image acquisition challenging due to uncooperative patient and Image acquisition challenging due to patient body habitus. IMPRESSIONS  1. Left ventricular ejection fraction, by estimation, is 55 to  60%. The left ventricle has normal function. Left ventricular endocardial border not optimally defined to evaluate regional wall motion. There is mild left ventricular hypertrophy. Left ventricular diastolic parameters are consistent with Grade II diastolic dysfunction (pseudonormalization).  2. Right ventricular systolic function is mildly reduced. The right ventricular size is moderately enlarged. There is normal pulmonary artery systolic pressure.  3. Left atrial size was severely  dilated.  4. Right atrial size was severely dilated.  5. The mitral valve is normal in structure. Mild mitral valve regurgitation. No evidence of mitral stenosis. Moderate mitral annular calcification.  6. Tricuspid valve regurgitation is moderate.  7. The aortic valve was not well visualized. Aortic valve regurgitation is not visualized. No aortic stenosis is present. Aortic valve mean gradient measures 5.0 mmHg.  8. The inferior vena cava is dilated in size with <50% respiratory variability, suggesting right atrial pressure of 15 mmHg. Conclusion(s)/Recommendation(s): No evidence of valvular vegetations on this transthoracic echocardiogram. Consider a transesophageal echocardiogram to exclude infective endocarditis if clinically indicated. FINDINGS  Left Ventricle: Left ventricular ejection fraction, by estimation, is 55 to 60%. The left ventricle has normal function. Left ventricular endocardial border not optimally defined to evaluate regional wall motion. The left ventricular internal cavity size was normal in size. There is mild left ventricular hypertrophy. Left ventricular diastolic parameters are consistent with Grade II diastolic dysfunction (pseudonormalization). Right Ventricle: The right ventricular size is moderately enlarged. No increase in right ventricular wall thickness. Right ventricular systolic function is mildly reduced. There is normal pulmonary artery systolic pressure. The tricuspid regurgitant velocity is 2.05  m/s, and with an assumed right atrial pressure of 15 mmHg, the estimated right ventricular systolic pressure is 31.8 mmHg. Left Atrium: Left atrial size was severely dilated. Right Atrium: Right atrial size was severely dilated. Pericardium: There is no evidence of pericardial effusion. Mitral Valve: The mitral valve is normal in structure. There is moderate calcification of the mitral valve leaflet(s). Moderate mitral annular calcification. Mild mitral valve regurgitation. No evidence of mitral valve stenosis. MV peak gradient, 7.3 mmHg. The mean mitral valve gradient is 2.0 mmHg. Tricuspid Valve: The tricuspid valve is normal in structure. Tricuspid valve regurgitation is moderate . No evidence of tricuspid stenosis. Aortic Valve: The aortic valve was not well visualized. Aortic valve regurgitation is not visualized. No aortic stenosis is present. Aortic valve mean gradient measures 5.0 mmHg. Aortic valve peak gradient measures 10.2 mmHg. Aortic valve area, by VTI measures 1.32 cm. There is a mechanical valve present in the aortic position. Pulmonic Valve: The pulmonic valve was normal in structure. Pulmonic valve regurgitation is not visualized. No evidence of pulmonic stenosis. Aorta: The aortic root is normal in size and structure. Venous: The inferior vena cava is dilated in size with less than 50% respiratory variability, suggesting right atrial pressure of 15 mmHg. IAS/Shunts: No atrial level shunt detected by color flow Doppler.  LEFT VENTRICLE PLAX 2D LVIDd:         4.16 cm  Diastology LVIDs:         2.62 cm  LV e' medial:   6.20 cm/s LV PW:         1.13 cm  LV E/e' medial: 20.6 LV IVS:        0.87 cm LVOT diam:     2.10 cm LV SV:         39 LV SV Index:   18 LVOT Area:     3.46 cm  LEFT ATRIUM            Index LA diam:      4.40 cm  2.01 cm/m LA Vol (A4C): 103.0 ml 46.97 ml/m  AORTIC VALVE                    PULMONIC VALVE AV Area (Vmax):    1.57 cm     PV Vmax:  0.83 m/s AV Area (Vmean):   1.41  cm     PV Vmean:      47.700 cm/s AV Area (VTI):     1.32 cm     PV VTI:        0.152 m AV Vmax:           160.00 cm/s  PV Peak grad:  2.7 mmHg AV Vmean:          108.000 cm/s PV Mean grad:  1.0 mmHg AV VTI:            0.296 m AV Peak Grad:      10.2 mmHg AV Mean Grad:      5.0 mmHg LVOT Vmax:         72.40 cm/s LVOT Vmean:        44.000 cm/s LVOT VTI:          0.113 m LVOT/AV VTI ratio: 0.38  AORTA Ao Root diam: 3.10 cm MITRAL VALVE                TRICUSPID VALVE MV Area (PHT): 2.34 cm     TR Peak grad:   16.8 mmHg MV Peak grad:  7.3 mmHg     TR Vmax:        205.00 cm/s MV Mean grad:  2.0 mmHg MV Vmax:       1.35 m/s     SHUNTS MV Vmean:      64.1 cm/s    Systemic VTI:  0.11 m MV Decel Time: 324 msec     Systemic Diam: 2.10 cm MV E velocity: 128.00 cm/s MV A velocity: 45.10 cm/s MV E/A ratio:  2.84 Lorine Bears MD Electronically signed by Lorine Bears MD Signature Date/Time: 09/22/2020/2:31:19 PM    Final      Labs:   Basic Metabolic Panel: Recent Labs  Lab 09/21/20 2136 09/21/20 2136 09/22/20 9147 09/23/20 0347 09/23/20 0347 09/24/20 0507 09/24/20 0507 09/25/20 0424 09/26/20 1143  NA 134*  --   --  134*  --  135  --  139 137  K 4.2   < >  --  4.2   < > 3.7   < > 3.5 3.7  CL 100  --   --  99  --  101  --  101 97*  CO2 24  --   --  24  --  25  --  27 29  GLUCOSE 108*  --   --  99  --  100*  --  100* 101*  BUN 23  --   --  34*  --  34*  --  32* 28*  CREATININE 1.14   < > 1.56* 1.62*  --  1.48*  --  1.31* 1.08  CALCIUM 8.4*  --   --  8.7*  --  8.4*  --  8.8* 8.8*  MG  --   --   --  2.3  --  2.4  --  2.3 2.1  PHOS  --   --   --  3.0  --  3.2  --  2.5 2.7   < > = values in this interval not displayed.   GFR Estimated Creatinine Clearance: 56.6 mL/min (by C-G formula based on SCr of 1.08 mg/dL). Liver Function Tests: No results for input(s): AST, ALT, ALKPHOS, BILITOT, PROT, ALBUMIN in the last 168 hours. No results for input(s): LIPASE, AMYLASE in the last 168 hours. No results  for input(s): AMMONIA  in the last 168 hours. Coagulation profile Recent Labs  Lab 09/22/20 0504 09/23/20 0347 09/24/20 0507 09/25/20 0424 09/26/20 1143  INR 1.7* 2.4* 2.6* 2.4* 2.8*    CBC: Recent Labs  Lab 09/21/20 1916 09/22/20 0504 09/23/20 0347 09/24/20 0507 09/25/20 0424  WBC 16.5* 21.8* 8.7 6.6 7.1  NEUTROABS 15.1*  --  7.4  --  5.6  HGB 10.3* 9.9* 9.7* 8.9* 10.2*  HCT 31.8* 32.0* 30.4* 28.1* 31.9*  MCV 90.1 92.5 91.6 92.1 90.9  PLT 259 239 182 167 196   Cardiac Enzymes: No results for input(s): CKTOTAL, CKMB, CKMBINDEX, TROPONINI in the last 168 hours. BNP: Invalid input(s): POCBNP CBG: Recent Labs  Lab 09/21/20 1839 09/24/20 1142  GLUCAP 101* 73   D-Dimer No results for input(s): DDIMER in the last 72 hours. Hgb A1c No results for input(s): HGBA1C in the last 72 hours. Lipid Profile No results for input(s): CHOL, HDL, LDLCALC, TRIG, CHOLHDL, LDLDIRECT in the last 72 hours. Thyroid function studies No results for input(s): TSH, T4TOTAL, T3FREE, THYROIDAB in the last 72 hours.  Invalid input(s): FREET3 Anemia work up No results for input(s): VITAMINB12, FOLATE, FERRITIN, TIBC, IRON, RETICCTPCT in the last 72 hours. Microbiology Recent Results (from the past 240 hour(s))  Urine Culture     Status: None   Collection Time: 09/21/20  7:00 PM   Specimen: Urine, Catheterized  Result Value Ref Range Status   Specimen Description   Final    URINE, CATHETERIZED Performed at [facility], [address] [place], [place] [address]    Special Requests   Final    NONE Performed at [facility], [address], [place], [place] [address]    Culture   Final    NO GROWTH Performed at [facility], [address], [place], [place] [address]    Report Status 09/23/2020 FINAL  Final  MRSA PCR Screening     Status: None   Collection Time: 09/21/20  7:00 PM   Specimen: Nasopharyngeal  Result Value Ref Range Status   MRSA by PCR  NEGATIVE NEGATIVE Final    Comment:        The GeneXpert MRSA Assay (FDA approved for NASAL specimens only), is one component of a comprehensive MRSA colonization surveillance program. It is not intended to diagnose MRSA infection nor to guide or monitor treatment for MRSA infections. Performed at [facility], [address] [place], [place] [address]   CULTURE, BLOOD (ROUTINE X 2) w Reflex to ID Panel     Status: None   Collection Time: 09/21/20  9:36 PM   Specimen: BLOOD  Result Value Ref Range Status   Specimen Description BLOOD BLOOD LEFT HAND  Final   Special Requests   Final    BOTTLES DRAWN AEROBIC AND ANAEROBIC Blood Culture adequate volume   Culture   Final    NO GROWTH 5 DAYS Performed at [facility], [address] [place], [place] [address]    Report Status 09/26/2020 FINAL  Final  CULTURE, BLOOD (ROUTINE X 2) w Reflex to ID Panel     Status: None   Collection Time: 09/21/20  9:36 PM   Specimen: BLOOD  Result Value Ref Range Status   Specimen Description BLOOD LEFT ANTECUBITAL  Final   Special Requests   Final    BOTTLES DRAWN AEROBIC ONLY Blood Culture results may not be optimal due to an inadequate volume of blood received in culture bottles   Culture  Final    NO GROWTH 5 DAYS Performed at [facility], [address] [place] [place], [place] [address]    Report Status 09/26/2020 FINAL  Final  Urine Culture     Status: None   Collection Time: 09/22/20  8:33 PM   Specimen: Urine, Random  Result Value Ref Range Status   Specimen Description   Final    URINE, RANDOM Performed at [facility], [address] [place], [place] [address]    Special Requests   Final    NONE Performed at [facility], [address], [place], [place] [address]    Culture   Final    NO GROWTH Performed at [facility], [address] [place], [place] [address]    Report Status 09/24/2020 FINAL  Final     Discharge  Instructions:   Discharge Instructions    Diet - low sodium heart healthy   Complete by: As directed    Discharge wound care:   Complete by: As directed    Keep wounds clean and dry   For home use only DME oxygen   Complete by: As directed    2 L/min oxygen required as needed for ambulation/activity   Length of Need: Lifetime   Mode or (Route): Nasal cannula   Liters per Minute: 2   Frequency: Continuous (stationary and portable oxygen unit needed)   Oxygen conserving device: Yes   Oxygen delivery system: Gas   Increase activity slowly   Complete by: As directed      Allergies as of 09/26/2020      Reactions   Sulfa Antibiotics Other (See Comments)   Reaction:  Unknown    Levofloxacin Nausea Only      Medication List    TAKE these medications   Acetaminophen 500 MG capsule Take 2 capsules by mouth every 8 (eight) hours as needed.   allopurinol 300 MG tablet Commonly known as: ZYLOPRIM Take 300 mg by mouth daily.   ALPRAZolam 0.25 MG tablet Commonly known as: XANAX Take 1 tablet (0.25 mg total) by mouth daily as needed for anxiety.   busPIRone 5 MG tablet Commonly known as: BUSPAR Take 1 tablet (5 mg total) by mouth 2 (two) times daily.   Cetirizine HCl 10 MG Caps Take 10 mg by mouth daily.   DULoxetine 30 MG capsule Commonly known as: CYMBALTA Take 1 capsule (30 mg total) by mouth daily.   finasteride 5 MG tablet Commonly known as: PROSCAR Take 5 mg by mouth daily.   metolazone 2.5 MG tablet Commonly known as: ZAROXOLYN Take 1 tablet (2.5 mg total) by mouth 2 (two) times a week.   metoprolol succinate 25 MG 24 hr tablet Commonly known as: TOPROL-XL Take 0.5 tablets (12.5 mg total) by mouth daily.   pantoprazole 40 MG tablet Commonly known as: PROTONIX Take 40 mg by mouth daily.   potassium chloride 10 MEQ tablet Commonly known as: KLOR-CON TAKE 3 TABLETS (30 MEQ) BY MOUTH DAILY. TAKE AN ADDITIONAL 2TABS ON THE DAYS YOU TAKE METOLAZONE    rOPINIRole 2 MG tablet Commonly known as: REQUIP TAKE 1 TABLET BY MOUTH 4  TIMES DAILY What changed: See the new instructions.   torsemide 20 MG tablet Commonly known as: Demadex Take 2 tablets (40 mg total) by mouth 2 (two) times daily.   traZODone 50 MG tablet Commonly known as: DESYREL Take 50 mg by mouth at bedtime.   warfarin 2.5 MG tablet Commonly known as: COUMADIN Take  as directed. If you are unsure how to take this medication, talk to your nurse or doctor. Original instructions: Take 1 tablet (2.5 mg total) by mouth daily at 4 PM. Takes daily at 1700 What changed:   how much to take  when to take this  additional instructions            Durable Medical Equipment  (From admission, onward)         Start     Ordered   09/26/20 0000  For home use only DME oxygen       Comments: 2 L/min oxygen required as needed for ambulation/activity  Question Answer Comment  Length of Need Lifetime   Mode or (Route) Nasal cannula   Liters per Minute 2   Frequency Continuous (stationary and portable oxygen unit needed)   Oxygen conserving device Yes   Oxygen delivery system Gas      09/26/20 1258           Discharge Care Instructions  (From admission, onward)         Start     Ordered   09/26/20 0000  Discharge wound care:       Comments: Keep wounds clean and dry   09/26/20 1258          Contact information for after-discharge care    Destination    [facility] .   Service: Skilled Nursing Contact information: [address] [place] [address] [phone]                   Time coordinating discharge: 35 minutes  Signed:  Lurene Shadow  [facility] 09/26/2020, 12:59 PM   Pager on [email or web address]. If 7PM-7AM, please contact night-coverage at [email or web address]

## 2020-09-26 NOTE — Progress Notes (Signed)
OT Cancellation Note  Patient Details Name: [name] MRN: [id number] DOB: [birth year]   Cancelled Treatment:    Reason Eval/Treat Not Completed: Patient declined, no reason specified. Upon arrival pt seated EOB eating breakfast. Reports he is not ready for mobility at this time and plans to d/c later this date. Will continue to follow and engage in OT treatment as pt able and willing to participate.   Kathie Dike, M.S. OTR/L  09/26/20, 10:16 AM  ascom [phone]

## 2020-09-26 NOTE — TOC Transition Note (Signed)
Transition of Care [facility]) - CM/SW Discharge Note   Patient Details  Name: [name] MRN: [id number] Date of Birth: 1931/08/31  Transition of Care [facility]) CM/SW Contact:  Eilleen Kempf, LCSW Phone Number: 09/26/2020, 2:22 PM   Clinical Narrative:    CSW spoke with Aundra Millet, patient has authorization and is [facility]. RM# [id number] is scheduled to pick up at 4:00pm     Barriers to Discharge: Continued Medical Work up   Patient Goals and CMS Choice Patient states their goals for this hospitalization and ongoing recovery are:: to get better   Choice offered to / list presented to : Patient  Discharge Placement                       Discharge Plan and Services In-house Referral: NA   Post Acute Care Choice: Skilled Nursing Facility                               Social Determinants of Health (SDOH) Interventions     Readmission Risk Interventions Readmission Risk Prevention Plan 09/18/2020 08/10/2020 08/04/2020  Transportation Screening Complete Complete Complete  PCP or Specialist Appt within 3-5 Days Complete Complete -  HRI or Home Care Consult Complete Complete (No Data)  Social Work Consult for Recovery Care Planning/Counseling Complete - Complete  Palliative Care Screening Complete Not Applicable Not Applicable  Medication Review Oceanographer) Complete Complete Complete  Some recent data might be hidden

## 2020-09-26 NOTE — TOC Progression Note (Signed)
Transition of Care [facility]) - Progression Note    Patient Details  Name: [name] MRN: [id number] Date of Birth: May 21, 1931  Transition of Care [facility]) CM/SW Contact  Eilleen Kempf, LCSW Phone Number: 09/26/2020, 9:31 AM  Clinical Narrative:    CSW called patient's POA, Cala Bradford to confirm discharge plan. Cala Bradford stated the patient brought his CPAP with him and will take it back to the facility when he discharges. Prior to this admission patient did not require oxygen, if needed now will need an order for it. Cala Bradford confirmed patient will [facility] to [facility]   Expected Discharge Plan: Skilled Nursing Facility Barriers to Discharge: Continued Medical Work up  Expected Discharge Plan and Services Expected Discharge Plan: Skilled Nursing Facility In-house Referral: NA   Post Acute Care Choice: Skilled Nursing Facility Living arrangements for the past 2 months: Assisted Living Facility                                       Social Determinants of Health (SDOH) Interventions    Readmission Risk Interventions Readmission Risk Prevention Plan 09/18/2020 08/10/2020 08/04/2020  Transportation Screening Complete Complete Complete  PCP or Specialist Appt within 3-5 Days Complete Complete -  HRI or Home Care Consult Complete Complete (No Data)  Social Work Consult for Recovery Care Planning/Counseling Complete - Complete  Palliative Care Screening Complete Not Applicable Not Applicable  Medication Review Oceanographer) Complete Complete Complete  Some recent data might be hidden

## 2020-09-26 NOTE — Progress Notes (Signed)
VSS at time of discharge. Patient discharged with medical transport. Discharge packet provided. PIV and tele removed from patient.

## 2020-09-26 NOTE — Consult Note (Signed)
ANTICOAGULATION CONSULT NOTE   Pharmacy Consult for warfarin Indication: atrial fibrillation and mAVR  Patient Measurements: Height: 5\' 11"  (180.3 cm) Weight: 94.7 kg (208 lb 12.8 oz) IBW/kg (Calculated) : 75.3 Heparin Dosing Weight: 94.9 kg  Vital Signs: Temp: 97.5 F (36.4 C) (11/02 1304) Temp Source: Oral (11/02 1304) BP: 130/69 (11/02 1304) Pulse Rate: 67 (11/02 1304)  Labs: Recent Labs    09/24/20 0507 09/25/20 0424 09/26/20 1143  HGB 8.9* 10.2*  --   HCT 28.1* 31.9*  --   PLT 167 196  --   LABPROT 26.7* 25.2* 28.6*  INR 2.6* 2.4* 2.8*  HEPARINUNFRC 0.37  --   --   CREATININE 1.48* 1.31* 1.08    Estimated Creatinine Clearance: 56.6 mL/min (by C-G formula based on SCr of 1.08 mg/dL).   Medical History: Past Medical History:  Diagnosis Date  . Anxiety 10/11  . Aortic stenosis    a. s/p mechcanical AVR, 2002; b. 10/2018 Echo: Triv AI, mean grad 11/2018.  . Bradycardia    chronic, no symptoms 07/2010  . C. difficile colitis   . Carotid bruit    dopplers in past, no abnormalities  . Chronic diastolic CHF (congestive heart failure) (HCC)    a. Echo 03/2015: EF 60-65%, no RWMA, GR1DD, mild BAE, mild to mod MR, mod TR, PASP 65 mmHg; b. 01/2017 Echo: EF 55-60%, NRWMA, grade 1 diastolic dysfunction.  Normal functioning prosthetic aortic valve.  Mean gradient 50 mmHg.  Sev TR. PASP 02/2017; c. 10/2018 Echo: EF 55-60%, Triv AI, mod dil LA, mod-sev TR, PASP 35-40, mild to mod red RV fxn.  . Coronary artery disease    a. mild, cath, 08/2010; b. medically managed  . Decreased hearing    Right ear  . Depression   . Gastric ulcer   . GERD (gastroesophageal reflux disease)   . Hypertension    BP higher than usual 04/19/10; amlodipine increased by telephone  . Mod-Sev Tricuspid regurgitation    a. 10/2018 Echo: Mod-Sev TR, PASP 35-53mmHg.  43m RLS (restless legs syndrome) 08/23/2015  . S/P AVR    a. St. Jude. mechanical 2002; b. echo 08/2010 EF 60%, trival AI, mild MR, AVR  working well; c. on longterm warfarin tx  . SOB (shortness of breath) 10/11   08/2010,Episodes at 5 AM, eventually felt to be anxiety, after complete workup including catheterization, pt greatly improved with anxiety meds 11/11    Assessment: Pharmacy was consulted to start heparin and restart warfarin for mAVR and afib. INR 1.4 on admission. Hgb 9.7, Plt 220.  Provider wants to continue heparin bridge until INR is therapeutic. Pt has history of GI bleeds. GI cleared patient to restart anticoagulation. NO major DDI with warfarin. Pt is on augmentin 875 mg BID.   Home dose warfarin:  warfarin 2.5 mg MTuThFSaSu and 3.75 mg on Wednesday  warfarin course:  Date                            INR                              Dose 10/20                           1.4  NONE 10/25                                                               2.5 mg 10/26                           1.2                               4 mg 10/27                           1.2   4 mg                                  10/28                           1.4                               5 mg 10/29                           1.7                               4 mg 10/30                           2.4                               1 mg  10/31   2.6        2.5 mg 11/1   2.4   3 mg 11/2   2.8   2.5 mg  Goals of Therapy:  INR 2.5-3.5 (per Coumadin clinic) Monitor platelets by anticoagulation protocol: Yes   Plan:  INR is therapeutic. Will give INR 2.5 mg x 1 tonight (home regimen). Daily INR ordered. CBC every 3 days.   Discharge recommendation: Continue home warfarin regimen:   warfarin 2.5 mg MTuThFSaSu and 3.75 mg on Wed.   And follow up with warfarin clinic in 2-3 days.   Ronnald Ramp, PharmD, BCPS 09/26/2020,1:12 PM

## 2020-09-26 NOTE — Progress Notes (Signed)
Pt received two duonebs and two doses of Morphine IV  throughout the night for wheezing, shallow breathing and anxiety.  O2 sat 97% on 2 liters  at this time.  Pt now using the urinal w/o difficulty.

## 2020-09-26 NOTE — Plan of Care (Signed)

## 2020-09-26 NOTE — Progress Notes (Addendum)
Mobility Specialist - Progress Note   09/26/20 1217  Mobility  Activity Ambulated in room (ambulated in room twice)  Level of Assistance Contact guard assist, steadying assist  Assistive Device Front wheel walker  Distance Ambulated (ft) 60 ft  Mobility Response Tolerated well  Mobility performed by Mobility specialist  $Mobility charge 1 Mobility    Pt laying in bed w/ daughter present in room upon arrival. Pt agreed to session. Pt currently on RA. Session required extra time for obtaining an accurate reading for O2 d/t poor pleth. Pt SBA w/ bed mobility. Pt needed mod. Assist w/ S2S for boost and steadying. Pt ambulated 30' in room 1st attempt, unable to measure O2 d/t poor pleth. While sitting on recliner after 1st attempt, mobility specialist able to secure O2 reading by placing O2 reading on his R ear lobe. O2 sat > 90% while resting. Ambulated an additional 30' room. Noted O2 desat to 87% while on RA. Noted pt SOB. Pt states it's d/t wearing a mask while ambulating. Unable to measure O2 after ambulating a 2nd attempt d/t O2 reading constantly falling off ear lobe. Pain had no complaints. Only noted slight SOB while ambulating w/ a mask on. Pt needed CGA t/o ambulation for steadying and safety. Pt has a slow and slightly unstable gait d/t feeling weak in LE. Overall, pt tolerated session well. Pt left sitting on recliner w/ daughter and NP present in room. Chair alarm set.       Rosamaria Donn Mobility Specialist  09/26/20, 12:29 PM

## 2020-09-26 NOTE — Consult Note (Signed)
Consultation Note Date: 09/26/2020   Patient Name: [name]  DOB: [birth year]  MRN: [id number]  Age / Sex: 86 y.o., male  PCP: Gracelyn NurseJohnston, [name], MD Referring Physician: Lurene ShadowAyiku, Bernard, MD  Reason for Consultation: Establishing goals of care  HPI/Patient Profile: [name] is a 86 y.o. male with medical history significant for aortic stenosis s/p mechanical AVR on Coumadin, chronic diastolic heart failure, hypertension, CAD, chronic venous stasis,chronic anemia,GERD and history of C. difficile, and recent history of  GI hemorrhage in September 2021 with hb of 4, sent from his PCPs office with hemoglobin of 7.4.   Clinical Assessment and Goals of Care: Patient is sitting in bedside chair. Mobility person is working with him. Daughter is at bedside. She states patient lives in an apartment and his wife lives in the SNF part of the area. She states prior to last admission, he would go see her to visit. He was fully independent, and drove.   She states following last admission, he has declined. She states he spends his time in his chair and no longer drives. She states a person comes in to help with cleaning around the house Monday-Friday.  Daughter states following last admission, outpatient palliative was supposed to meet with them, but was not able to before this admission. She states they have questions about a DNR status and care moving forward.   Mobility person left, and we discussed his diagnosis, prognosis, GOC, EOL wishes disposition and options.  A detailed discussion was had today regarding advanced directives.  Concepts specific to code status, artifical feeding and hydration, IV antibiotics and rehospitalization were discussed.  The difference between an aggressive medical intervention path and a comfort care path was discussed.  Values and goals of care important to patient and  family were attempted to be elicited.  Discussed limitations of medical interventions to prolong quality of life in some situations and discussed the concept of human mortality.  Patient discusses his decline. He discusses his wishes. He confirms his daughter [name] who is present is his HPOA. He states he would like to continue to work on treating the treatable for now as his QOL remains acceptable. He is interested in returning to the hospital if needed. Patient states he would not want to be placed on a ventilator in the case of respiratory distress/failure, or to have a feeding tube, or to have dialysis. He states he would not want to have chest compressions, shocks, or a breathing tube if his heart or breathing stopped.   Patient and daughter verbalizes understanding and appreciation of detailed discussion.   Educated patient and family on natural trajectory of illness, and expectations at EOL were discussed.  All questions and concerns addressed.   I completed a MOST form today with patient, in the presence of his daughter [name] who he identifies as his HPOA, and the signed original was placed in the chart. A photocopy was placed in the chart to be scanned into EMR. The patient outlined their wishes for the  following treatment decisions:  Cardiopulmonary Resuscitation: Do Not Attempt Resuscitation (DNR/No CPR)  Medical Interventions: Limited Additional Interventions: Use medical treatment, IV fluids and cardiac monitoring as indicated, DO NOT USE intubation or mechanical ventilation. May consider use of less invasive airway support such as BiPAP or CPAP. Also provide comfort measures. Transfer to the hospital if indicated. Avoid intensive care.   Antibiotics: Antibiotics if indicated  IV Fluids: IV fluids for a defined trial period  Feeding Tube: No feeding tube          SUMMARY OF RECOMMENDATIONS   Recommend palliative to follow outpatient. DNR/DNI.   Prognosis:   Poor overall       Primary Diagnoses: Present on Admission:  Permanent atrial fibrillation (HCC)  Hypertension  Chronic diastolic CHF (congestive heart failure) (HCC)  Coronary artery disease  AKI (acute kidney injury) (HCC)  Acute blood loss anemia  Symptomatic anemia   I have reviewed the medical record, interviewed the patient and family, and examined the patient. The following aspects are pertinent.  Past Medical History:  Diagnosis Date   Anxiety 10/11   Aortic stenosis    a. s/p mechcanical AVR, 2002; b. 10/2018 Echo: Triv AI, mean grad .   Bradycardia    chronic, no symptoms 07/2010   C. difficile colitis    Carotid bruit    dopplers in past, no abnormalities   Chronic diastolic CHF (congestive heart failure) (HCC)    a. Echo 03/2015: EF 60-65%, no RWMA, GR1DD, mild BAE, mild to mod MR, mod TR, PASP 65 mmHg; b. 01/2017 Echo: EF 55-60%, NRWMA, grade 1 diastolic dysfunction.  Normal functioning prosthetic aortic valve.  Mean gradient 50 mmHg.  Sev TR. PASP ; c. 10/2018 Echo: EF 55-60%, Triv AI, mod dil LA, mod-sev TR, PASP 35-40, mild to mod red RV fxn.   Coronary artery disease    a. mild, cath, 08/2010; b. medically managed   Decreased hearing    Right ear   Depression    Gastric ulcer    GERD (gastroesophageal reflux disease)    Hypertension    BP higher than usual 04/19/10; amlodipine increased by telephone   Mod-Sev Tricuspid regurgitation    a. 10/2018 Echo: Mod-Sev TR, PASP 35-22mmHg.   RLS (restless legs syndrome) 08/23/2015   S/P AVR    a. St. Jude. mechanical 2002; b. echo 08/2010 EF 60%, trival AI, mild MR, AVR working well; c. on longterm warfarin tx   SOB (shortness of breath) 10/11   08/2010,Episodes at 5 AM, eventually felt to be anxiety, after complete workup including catheterization, pt greatly improved with anxiety meds 11/11   Social History   Socioeconomic History   Marital status: Married    Spouse name: Not on file   Number of  children: 3   Years of education: some coll.   Highest education level: Not on file  Occupational History   Occupation: Retired    Associate Professor: RETIRED  Tobacco Use   Smoking status: Former Smoker    Types: Cigarettes    Quit date: 1979    Years since quitting: 42.8   Smokeless tobacco: Never Used  Building services engineer Use: Never used  Substance and Sexual Activity   Alcohol use: Yes    Alcohol/week: 3.0 standard drinks    Types: 3 Glasses of wine per week   Drug use: No   Sexual activity: Never  Other Topics Concern   Not on file  Social History Narrative   No regular exercise.  Patient lives at [facility]. His wife lives in same facility but she lives in the assisted living part.      Patient drinks 1-2 cups of caffeine daily.   Patient is right handed.   Social Determinants of Health   Financial Resource Strain:    Difficulty of Paying Living Expenses: Not on file  Food Insecurity:    Worried About Programme researcher, broadcasting/film/video in the Last Year: Not on file   [employer] of Food in the Last Year: Not on file  Transportation Needs:    Lack of Transportation (Medical): Not on file   Lack of Transportation (Non-Medical): Not on file  Physical Activity:    Days of Exercise per Week: Not on file   Minutes of Exercise per Session: Not on file  Stress:    Feeling of Stress : Not on file  Social Connections:    Frequency of Communication with Friends and Family: Not on file   Frequency of Social Gatherings with Friends and Family: Not on file   Attends Religious Services: Not on file   Active Member of Clubs or Organizations: Not on file   Attends Banker Meetings: Not on file   Marital Status: Not on file   Family History  Problem Relation Age of Onset   Hypertension Mother    Diabetes type II Mother    Heart disease Mother    Heart attack Mother    Hypertension Father    Heart disease Father    Stroke Father    Stroke Brother     Stroke Brother    Hypertension Other    Scheduled Meds:  allopurinol  300 mg Oral Daily   amoxicillin-clavulanate  1 tablet Oral Q12H   busPIRone  5 mg Oral BID   chlorhexidine  15 mL Mouth Rinse BID   Chlorhexidine Gluconate Cloth  6 each Topical Daily   docusate sodium  200 mg Oral BID   DULoxetine  30 mg Oral Daily   finasteride  5 mg Oral Daily   lidocaine  1 patch Transdermal Q24H   mouth rinse  15 mL Mouth Rinse q12n4p   metoprolol succinate  12.5 mg Oral Daily   pantoprazole  40 mg Oral BID AC   polyethylene glycol  17 g Oral Daily   rOPINIRole  1 mg Oral Daily   rOPINIRole  2 mg Oral TID   torsemide  40 mg Oral Daily   traZODone  50 mg Oral QHS   warfarin  2.5 mg Oral ONCE-1600   Warfarin - Pharmacist Dosing Inpatient   Does not apply q1600   Continuous Infusions:  sodium chloride     PRN Meds:.acetaminophen **OR** acetaminophen, ALPRAZolam, bisacodyl, ipratropium-albuterol, ondansetron **OR** ondansetron (ZOFRAN) IV Medications Prior to Admission:  Prior to Admission medications   Medication Sig Start Date End Date Taking? Authorizing Provider  allopurinol (ZYLOPRIM) 300 MG tablet Take 300 mg by mouth daily.  05/16/20  Yes [provider]  Cetirizine HCl 10 MG CAPS Take 10 mg by mouth daily.   Yes [provider]  finasteride (PROSCAR) 5 MG tablet Take 5 mg by mouth daily.     Yes [provider]  metoprolol succinate (TOPROL-XL) 25 MG 24 hr tablet Take 0.5 tablets (12.5 mg total) by mouth daily. 03/06/20  Yes Gollan, Tollie Pizza, MD  pantoprazole (PROTONIX) 40 MG tablet Take 40 mg by mouth daily.   Yes [provider]  potassium chloride (KLOR-CON) 10 MEQ tablet TAKE 3  TABLETS (30 MEQ) BY MOUTH DAILY. TAKE AN ADDITIONAL 2TABS ON THE DAYS YOU TAKE METOLAZONE 06/08/20  Yes Gollan, Tollie Pizza, MD  rOPINIRole (REQUIP) 2 MG tablet TAKE 1 TABLET BY MOUTH 4  TIMES DAILY Patient taking differently: Take 2 mg by mouth in the  morning, at noon, in the evening, and at bedtime. Take 1 tablet by mouth 4  times daily 09/14/18  Yes Millikan, Aundra Millet, NP  traZODone (DESYREL) 50 MG tablet Take 50 mg by mouth at bedtime.   Yes [provider]  Acetaminophen 500 MG capsule Take 2 capsules by mouth every 8 (eight) hours as needed.    [provider]  ALPRAZolam Prudy Feeler) 0.25 MG tablet Take 1 tablet (0.25 mg total) by mouth daily as needed for anxiety. 09/26/20   Lurene Shadow, MD  busPIRone (BUSPAR) 5 MG tablet Take 1 tablet (5 mg total) by mouth 2 (two) times daily. Patient not taking: Reported on 09/13/2020 08/10/20   Lurene Shadow, MD  DULoxetine (CYMBALTA) 30 MG capsule Take 1 capsule (30 mg total) by mouth daily. Patient not taking: Reported on 09/13/2020 07/15/20   Meredeth Ide, MD  metolazone (ZAROXOLYN) 2.5 MG tablet Take 1 tablet (2.5 mg total) by mouth 2 (two) times a week. 02/17/20 09/13/20  Antonieta Iba, MD  torsemide (DEMADEX) 20 MG tablet Take 2 tablets (40 mg total) by mouth 2 (two) times daily. 09/26/20   Lurene Shadow, MD  warfarin (COUMADIN) 2.5 MG tablet Take 1 tablet (2.5 mg total) by mouth daily at 4 PM. Takes daily at 1700 09/26/20   Lurene Shadow, MD   Allergies  Allergen Reactions   Sulfa Antibiotics Other (See Comments)    Reaction:  Unknown    Levofloxacin Nausea Only   Review of Systems  Constitutional: Positive for activity change.  Cardiovascular: Positive for leg swelling.    Physical Exam Pulmonary:     Effort: Pulmonary effort is normal.  Skin:    General: Skin is warm and dry.  Neurological:     Mental Status: He is alert.     Vital Signs: BP 130/69 (BP Location: Right Arm)    Pulse 67    Temp (!) 97.5 F (36.4 C) (Oral)    Resp 17    Ht 5\' 11"  (1.803 m)    Wt 94.7 kg    SpO2 100%    BMI 29.12 kg/m  Pain Scale: 0-10   Pain Score: Asleep   SpO2: SpO2: 100 % O2 Device:SpO2: 100 % O2 Flow Rate: .O2 Flow Rate (L/min): 2 L/min  IO: Intake/output summary:    Intake/Output Summary (Last 24 hours) at 09/26/2020 1313 Last data filed at 09/26/2020 1000 Gross per 24 hour  Intake 360 ml  Output 2425 ml  Net -2065 ml    LBM: Last BM Date: 09/23/20 (Per pt) Baseline Weight: Weight: 90.7 kg Most recent weight: Weight: 94.7 kg     Palliative Assessment/Data: 50%     Time In: 11:45 Time Out: 12:55 Time Total:70 min Greater than 50%  of this time was spent counseling and coordinating care related to the above assessment and plan.  Signed by: 09/25/20, NP   Please contact Palliative Medicine Team phone at [phone] for questions and concerns.  For individual provider: See [phone]

## 2020-09-29 ENCOUNTER — Other Ambulatory Visit
Admission: RE | Admit: 2020-09-29 | Discharge: 2020-09-29 | Disposition: A | Discharge status: Home / Self Care | Payer: Medicare Other | Source: Ambulatory Visit | Attending: Internal Medicine | Admitting: Internal Medicine

## 2020-09-29 DIAGNOSIS — I4891 Unspecified atrial fibrillation: Secondary | ICD-10-CM | POA: Insufficient documentation

## 2020-09-29 LAB — PROTIME-INR
INR: 2.5 — ABNORMAL HIGH (ref 0.8–1.2)
Prothrombin Time: 26.4 seconds — ABNORMAL HIGH (ref 11.4–15.2)

## 2020-10-05 ENCOUNTER — Other Ambulatory Visit
Admission: RE | Admit: 2020-10-05 | Discharge: 2020-10-05 | Disposition: A | Discharge status: Home / Self Care | Payer: Medicare Other | Source: Ambulatory Visit | Attending: Internal Medicine | Admitting: Internal Medicine

## 2020-10-06 ENCOUNTER — Other Ambulatory Visit
Admission: RE | Admit: 2020-10-06 | Discharge: 2020-10-06 | Disposition: A | Discharge status: Home / Self Care | Payer: Medicare Other | Source: Skilled Nursing Facility | Attending: Internal Medicine | Admitting: Internal Medicine

## 2020-10-06 DIAGNOSIS — I4891 Unspecified atrial fibrillation: Secondary | ICD-10-CM | POA: Diagnosis present

## 2020-10-06 LAB — PROTIME-INR
INR: 1.4 — ABNORMAL HIGH (ref 0.8–1.2)
Prothrombin Time: 16.5 s — ABNORMAL HIGH (ref 11.4–15.2)

## 2020-10-10 ENCOUNTER — Other Ambulatory Visit
Admission: RE | Admit: 2020-10-10 | Discharge: 2020-10-10 | Disposition: A | Discharge status: Home / Self Care | Payer: Medicare Other | Source: Ambulatory Visit | Attending: Internal Medicine | Admitting: Internal Medicine

## 2020-10-10 DIAGNOSIS — I4891 Unspecified atrial fibrillation: Secondary | ICD-10-CM | POA: Insufficient documentation

## 2020-10-10 LAB — PROTIME-INR
INR: 1.4 — ABNORMAL HIGH (ref 0.8–1.2)
Prothrombin Time: 16.5 seconds — ABNORMAL HIGH (ref 11.4–15.2)

## 2020-10-13 ENCOUNTER — Other Ambulatory Visit: Payer: Self-pay

## 2020-10-13 ENCOUNTER — Other Ambulatory Visit
Admission: RE | Admit: 2020-10-13 | Discharge: 2020-10-13 | Disposition: A | Discharge status: Home / Self Care | Payer: Medicare Other | Source: Ambulatory Visit | Attending: Internal Medicine | Admitting: Internal Medicine

## 2020-10-13 ENCOUNTER — Non-Acute Institutional Stay: Payer: Medicare Other | Admitting: Adult Health Nurse Practitioner

## 2020-10-13 DIAGNOSIS — I739 Peripheral vascular disease, unspecified: Secondary | ICD-10-CM

## 2020-10-13 DIAGNOSIS — Z5181 Encounter for therapeutic drug level monitoring: Secondary | ICD-10-CM | POA: Diagnosis present

## 2020-10-13 DIAGNOSIS — I5032 Chronic diastolic (congestive) heart failure: Secondary | ICD-10-CM

## 2020-10-13 DIAGNOSIS — D649 Anemia, unspecified: Secondary | ICD-10-CM

## 2020-10-13 DIAGNOSIS — Z515 Encounter for palliative care: Secondary | ICD-10-CM

## 2020-10-13 LAB — PROTIME-INR
INR: 1.6 — ABNORMAL HIGH (ref 0.8–1.2)
Prothrombin Time: 18.3 seconds — ABNORMAL HIGH (ref 11.4–15.2)

## 2020-10-13 NOTE — Progress Notes (Signed)
Therapist, nutritional Palliative Care Consult Note Telephone: [phone]  Fax: [phone]  PATIENT NAME: [name] DOB: 1930/11/27 MRN: [id number]  PRIMARY CARE PROVIDER:   Gracelyn Nurse, MD  REFERRING PROVIDER:  Dr. Dareen Piano  RESPONSIBLE PARTY:   Self Daughter, Prudencio Burly [phone]       RECOMMENDATIONS and PLAN:  1.  Advanced care planning.  Patient is DNR.  Will update daughter on visit  2.  Functional status.  He states that he can walk with walker and perform ADLs with minimal assistance. States that while at the facility he has to have assistance with walking and with going to the bathroom.  He is continent of [place]. States that he did have a fall this morning going from his bed to the bathroom and he fell into the wall. States that he hit his head but denies headache, pain, dizziness. States having pain in right hip and knees but states that this is baseline due to his arthritis. He is receiving short term rehab at facility.  Continue PT/OT as ordered.  3.  Nutritional status.  Patient states good appetite.  Weight has been stable in the 190s.  Staff reports that he eats well.  Continue supportive care at the facility.  4.  Goals of care.  [place]  Does state that he will have someone who will help with shopping and cleaning.  He does state that he was told that if he ends up in the hospital again with GI bleed that he will require long term care placement.   Palliative will continue to monitor for symptom management/decline and make recommendations as needed.  Will follow up in 4-6 weeks.    I spent 30 minutes providing this consultation,  from 11:00 to 11:30 including time spent with patient/family, chart review, provider coordination, documentation. More than 50% of the time in this consultation was spent coordinating communication.   HISTORY OF PRESENT ILLNESS:  [name] is a  86 y.o. year old male with multiple medical problems including aortic stenosis w/valve replacement (on coumadin), CHF, HTN, CAD, venous stasis.  Palliative Care was asked to help address goals of care. After last visit patient had finished short term rehab after having a GI bleed.  [place] able [place]  Shortly after he was readmitted to hospital 10/20-11/2/21 for symptomatic anemia and was found to have duodenal ulcers.  Hospitalization was complicated due to sepsis secondary to aspiration pneumonia.  Patient is currently receiving short term rehab at [facility].  Patient states still having fatigue and weakness but feels like this is getting better. Patient does have chronic wound to left lower leg related to venous stasis. Denies fever, headache, SOB, cough, N/V/D, constipation, dysuria, hematuria, melena, hematochezia.  CODE STATUS: DNR  PPS: 40% HOSPICE ELIGIBILITY/DIAGNOSIS: TBD  PHYSICAL EXAM:  HR 72 O2 100% on RA General: NAD, frail appearing Cardiovascular: regular rate and rhythm Pulmonary: lung sounds clear; normal respiratory effort Abdomen: soft, nontender, + bowel sounds GU: no suprapubic tenderness Extremities: no edema, no joint deformities; has wound to left lower leg, did not take off bandage for exam (being followed at wound clinic) Skin: no rashes on exposed skin Neurological: Weakness but otherwise nonfocal  PAST MEDICAL HISTORY:  Past Medical History:  Diagnosis Date  . Anxiety 10/11  . Aortic stenosis    a. s/p mechcanical AVR, 2002; b. 10/2018  Echo: Triv AI, mean grad .  . Bradycardia    chronic, no symptoms 07/2010  . C. difficile colitis   . Carotid bruit    dopplers in past, no abnormalities  . Chronic diastolic CHF (congestive heart failure) (HCC)    a. Echo 03/2015: EF 60-65%, no RWMA, GR1DD, mild BAE, mild to mod MR, mod TR, PASP 65 mmHg; b. 01/2017 Echo: EF 55-60%, NRWMA, grade 1 diastolic dysfunction.  Normal  functioning prosthetic aortic valve.  Mean gradient 50 mmHg.  Sev TR. PASP ; c. 10/2018 Echo: EF 55-60%, Triv AI, mod dil LA, mod-sev TR, PASP 35-40, mild to mod red RV fxn.  . Coronary artery disease    a. mild, cath, 08/2010; b. medically managed  . Decreased hearing    Right ear  . Depression   . Gastric ulcer   . GERD (gastroesophageal reflux disease)   . Hypertension    BP higher than usual 04/19/10; amlodipine increased by telephone  . Mod-Sev Tricuspid regurgitation    a. 10/2018 Echo: Mod-Sev TR, PASP 35-63mmHg.  Marland Kitchen RLS (restless legs syndrome) 08/23/2015  . S/P AVR    a. [employer]. mechanical 2002; b. echo 08/2010 EF 60%, trival AI, mild MR, AVR working well; c. on longterm warfarin tx  . SOB (shortness of breath) 10/11   08/2010,Episodes at 5 AM, eventually felt to be anxiety, after complete workup including catheterization, pt greatly improved with anxiety meds 11/11    SOCIAL HX:  Social History   Tobacco Use  . Smoking status: Former Smoker    Types: Cigarettes    Quit date: 1979    Years since quitting: 42.9  . Smokeless tobacco: Never Used  Substance Use Topics  . Alcohol use: Yes    Alcohol/week: 3.0 standard drinks    Types: 3 Glasses of wine per week    ALLERGIES:  Allergies  Allergen Reactions  . Sulfa Antibiotics Other (See Comments)    Reaction:  Unknown   . Levofloxacin Nausea Only     PERTINENT MEDICATIONS:  Outpatient Encounter Medications as of 10/13/2020  Medication Sig  . Acetaminophen 500 MG capsule Take 2 capsules by mouth every 8 (eight) hours as needed.  Marland Kitchen allopurinol (ZYLOPRIM) 300 MG tablet Take 300 mg by mouth daily.   Marland Kitchen ALPRAZolam (XANAX) 0.25 MG tablet Take 1 tablet (0.25 mg total) by mouth daily as needed for anxiety.  . busPIRone (BUSPAR) 5 MG tablet Take 1 tablet (5 mg total) by mouth 2 (two) times daily. (Patient not taking: Reported on 09/13/2020)  . Cetirizine HCl 10 MG CAPS Take 10 mg by mouth daily.  . DULoxetine (CYMBALTA)  30 MG capsule Take 1 capsule (30 mg total) by mouth daily. (Patient not taking: Reported on 09/13/2020)  . finasteride (PROSCAR) 5 MG tablet Take 5 mg by mouth daily.    . metolazone (ZAROXOLYN) 2.5 MG tablet Take 1 tablet (2.5 mg total) by mouth 2 (two) times a week.  . metoprolol succinate (TOPROL-XL) 25 MG 24 hr tablet Take 0.5 tablets (12.5 mg total) by mouth daily.  . pantoprazole (PROTONIX) 40 MG tablet Take 40 mg by mouth daily.  . potassium chloride (KLOR-CON) 10 MEQ tablet TAKE 3 TABLETS (30 MEQ) BY MOUTH DAILY. TAKE AN ADDITIONAL 2TABS ON THE DAYS YOU TAKE METOLAZONE  . rOPINIRole (REQUIP) 2 MG tablet TAKE 1 TABLET BY MOUTH 4  TIMES DAILY (Patient taking differently: Take 2 mg by mouth in the morning, at noon, in the evening, and at bedtime.  Take 1 tablet by mouth 4  times daily)  . torsemide (DEMADEX) 20 MG tablet Take 2 tablets (40 mg total) by mouth 2 (two) times daily.  . traZODone (DESYREL) 50 MG tablet Take 50 mg by mouth at bedtime.  Marland Kitchen warfarin (COUMADIN) 2.5 MG tablet Take 1 tablet (2.5 mg total) by mouth daily at 4 PM. Takes daily at 1700   No facility-administered encounter medications on file as of 10/13/2020.     Kegan Shepardson Marlena Clipper, NP

## 2020-10-17 ENCOUNTER — Encounter: Payer: Medicare Other | Attending: Internal Medicine | Admitting: Physician Assistant

## 2020-10-17 ENCOUNTER — Other Ambulatory Visit: Payer: Self-pay

## 2020-10-17 ENCOUNTER — Other Ambulatory Visit
Admission: RE | Admit: 2020-10-17 | Discharge: 2020-10-17 | Disposition: A | Discharge status: Home / Self Care | Payer: Medicare Other | Source: Ambulatory Visit | Attending: Internal Medicine | Admitting: Internal Medicine

## 2020-10-17 DIAGNOSIS — I4891 Unspecified atrial fibrillation: Secondary | ICD-10-CM | POA: Diagnosis present

## 2020-10-17 DIAGNOSIS — I87323 Chronic venous hypertension (idiopathic) with inflammation of bilateral lower extremity: Secondary | ICD-10-CM | POA: Insufficient documentation

## 2020-10-17 DIAGNOSIS — I890 Lymphedema, not elsewhere classified: Secondary | ICD-10-CM | POA: Diagnosis not present

## 2020-10-17 LAB — PROTIME-INR
INR: 2.2 — ABNORMAL HIGH (ref 0.8–1.2)
Prothrombin Time: 23.5 seconds — ABNORMAL HIGH (ref 11.4–15.2)

## 2020-10-17 NOTE — Progress Notes (Signed)
[name], [name] ([id number]) Visit Report for 10/17/2020 Allergy List Details Patient Name: [name], [name] Date of Service: 10/17/2020 9:15 AM Medical Record Number: [id number] Patient Account Number: [id number] Date of Birth/Sex: 1931/02/20 (86 y.o. M) Treating RN: Rogers Blocker Primary Care Deah Ottaway: [name] Other Clinician: Referring Aubert Choyce: [name] Treating Virgle Arth/Extender: Allen Derry Weeks in Treatment: 0 Allergies Active Allergies Levaquin Sulfa (Sulfonamide Antibiotics) Allergy Notes Electronic Signature(s) Signed: 10/17/2020 4:20:00 PM By: Phillis Haggis, Dondra Prader Entered By: Phillis Haggis, Dondra Prader on 10/17/2020 09:42:04 [name] ([id number]) -------------------------------------------------------------------------------- Arrival Information Details Patient Name: [name] Hilts A. Date of Service: 10/17/2020 9:15 AM Medical Record Number: [id number] Patient Account Number: [id number] Date of Birth/Sex: [birth year] (86 y.o. M) Treating RN: Rogers Blocker Primary Care Zyshawn Bohnenkamp: [name] Other Clinician: Referring Jahmiya Guidotti: [name] Treating Dmario Russom/Extender: Rowan Blase in Treatment: 0 Visit Information Patient Arrived: Wheel Chair Arrival Time: 09:40 Accompanied By: daughter Transfer Assistance: None Patient Identification Verified: Yes Secondary Verification Process Completed: Yes History Since Last Visit Electronic Signature(s) Signed: 10/17/2020 4:20:00 PM By: Phillis Haggis, Dondra Prader Entered By: Phillis Haggis, Dondra Prader on 10/17/2020 09:41:51 [name] Hilts A. ([id number]) -------------------------------------------------------------------------------- Clinic Level of Care Assessment Details Patient Name: [name] Hilts A. Date of Service: 10/17/2020 9:15 AM Medical Record Number: [id number] Patient Account Number: [id number] Date of Birth/Sex: 04-24-1931 (86 y.o. M) Treating RN: Huel Coventry Primary Care Mandela Bello:  [name] Other Clinician: Referring Airis Barbee: [name] Treating Valicia Rief/Extender: Rowan Blase in Treatment: 0 Clinic Level of Care Assessment Items TOOL 2 Quantity Score []  - Use when only an EandM is performed on the INITIAL visit 0 ASSESSMENTS - Nursing Assessment / Reassessment X - General Physical Exam (combine w/ comprehensive assessment (listed just below) when performed on new 1 20 pt. evals) X- 1 25 Comprehensive Assessment (HX, ROS, Risk Assessments, Wounds Hx, etc.) ASSESSMENTS - Wound and Skin Assessment / Reassessment X - Simple Wound Assessment / Reassessment - one wound 1 5 []  - 0 Complex Wound Assessment / Reassessment - multiple wounds []  - 0 Dermatologic / Skin Assessment (not related to wound area) ASSESSMENTS - Ostomy and/or Continence Assessment and Care []  - Incontinence Assessment and Management 0 []  - 0 Ostomy Care Assessment and Management (repouching, etc.) PROCESS - Coordination of Care X - Simple Patient / Family Education for ongoing care 1 15 []  - 0 Complex (extensive) Patient / Family Education for ongoing care X- 1 10 Staff obtains , Records, Test Results / Process Orders []  - 0 Staff telephones HHA, Nursing Homes / Clarify orders / etc []  - 0 Routine Transfer to another Facility (non-emergent condition) []  - 0 Routine Hospital Admission (non-emergent condition) []  - 0 New Admissions / / Ordering NPWT, Apligraf, etc. []  - 0 Emergency Hospital Admission (emergent condition) X- 1 10 Simple Discharge Coordination []  - 0 Complex (extensive) Discharge Coordination PROCESS - Special Needs []  - Pediatric / Minor Patient Management 0 []  - 0 Isolation Patient Management []  - 0 Hearing / Language / Visual special needs []  - 0 Assessment of Community assistance (transportation, D/C planning, etc.) []  - 0 Additional assistance / Altered mentation []  - 0 Support Surface(s) Assessment (bed,  cushion, seat, etc.) INTERVENTIONS - Wound Cleansing / Measurement X - Wound Imaging (photographs - any number of wounds) 1 5 []  - 0 Wound Tracing (instead of photographs) X- 1 5 Simple Wound Measurement - one wound []  - 0 Complex Wound Measurement - multiple wounds [name], [name] ( ) X- 1 5 Simple Wound Cleansing -  one wound []  - 0 Complex Wound Cleansing - multiple wounds INTERVENTIONS - Wound Dressings []  - Small Wound Dressing one or multiple wounds 0 X- 1 15 Medium Wound Dressing one or multiple wounds []  - 0 Large Wound Dressing one or multiple wounds []  - 0 Application of Medications - injection INTERVENTIONS - Miscellaneous []  - External ear exam 0 []  - 0 Specimen Collection (cultures, biopsies, blood, body fluids, etc.) []  - 0 Specimen(s) / Culture(s) sent or taken to Lab for analysis []  - 0 Patient Transfer (multiple staff / / Similar devices) []  - 0 Simple Staple / Suture removal (25 or less) []  - 0 Complex Staple / Suture removal (26 or more) []  - 0 Hypo / Hyperglycemic Management (close monitor of Blood Glucose) []  - 0 Ankle / Brachial Index (ABI) - do not check if billed separately Has the patient been seen at the hospital within the last three years: Yes Total Score: 115 Level Of Care: New/Established - Level 3 Electronic Signature(s) Signed: 10/17/2020 5:19:05 PM By: , BSN, RN, CWS, [name] RN, BSN Entered By: , BSN, RN, CWS, [name] on 10/17/2020 10:10:17 ( ) -------------------------------------------------------------------------------- Encounter Discharge Information Details Patient Name: A. Date of Service: 10/17/2020 9:15 AM Medical Record Number: Patient Account Number: Date of Birth/Sex: [birth year] (86 y.o. M) Treating RN: 10/19/2020 Primary Care Jacky Dross: Elliot Gurney Other Clinician: Referring Hatsue Sime: Elliot Gurney Treating Maximilien Hayashi/Extender:  10/19/2020 in Treatment: 0 Encounter Discharge Information Items Discharge Condition: Stable Ambulatory Status: Wheelchair Discharge Destination: Home Transportation: Private Auto Accompanied By: self Schedule Follow-up Appointment: Yes Clinical Summary of Care: Patient Declined Electronic Signature(s) Signed: 10/17/2020 5:17:28 PM By: [id number] RN Entered By: [name] Hilts on 10/17/2020 10:24:31 [id number] A. ([id number]) -------------------------------------------------------------------------------- Lower Extremity Assessment Details Patient Name: 09/14/1931 A. Date of Service: 10/17/2020 9:15 AM Medical Record Number: Huel Coventry Patient Account Number: [name] Date of Birth/Sex: [birth year] (86 y.o. M) Treating RN: 10/19/2020 Primary Care Felesha Moncrieffe: Yevonne Pax Other Clinician: Referring Jaselyn Nahm: Yevonne Pax Treating Encarnacion Scioneaux/Extender: 10/19/2020 Weeks in Treatment: 0 Edema Assessment Assessed: [Left: Yes] [Right: No] Edema: [Left: Ye] [Right: s] Calf Left: Right: Point of Measurement: 33 cm From Medial Instep 37 cm Ankle Left: Right: Point of Measurement: 10 cm From Medial Instep 23 cm Vascular Assessment Pulses: Dorsalis Pedis Doppler Audible: [Left:Yes] Posterior Tibial Doppler Audible: [Left:Yes] Electronic Signature(s) Signed: 10/17/2020 4:20:00 PM By: [id number], [name] Hilts Entered By: 10/19/2020, [id number] on 10/17/2020 09:51:51 09/14/1931, Anthonymichael A. (06-11-1977) -------------------------------------------------------------------------------- Multi Wound Chart Details Patient Name: Rogers Blocker A. Date of Service: 10/17/2020 9:15 AM Medical Record Number: [name] Patient Account Number: Allen Derry Date of Birth/Sex: 1931-11-08 (86 y.o. M) Treating RN: Dondra Prader Primary Care Baden Betsch: Phillis Haggis Other Clinician: Referring Sabriyah Wilcher: Dondra Prader Treating Happy Begeman/Extender: 10/19/2020 in Treatment: 0 Vital  Signs Height(in): Pulse(bpm): 64 Weight(lbs): Blood Pressure(mmHg): 121/76 Body Mass Index(BMI): Temperature(F): 97.6 Respiratory Rate(breaths/min): 20 Photos: [8:No Photos] [N/A:N/A] Wound Location: [8:Left, Medial Lower Leg] [N/A:N/A] Wounding Event: [8:Gradually Appeared] [N/A:N/A] Primary Etiology: [8:Venous Leg Ulcer] [N/A:N/A] Comorbid History: [8:Cataracts, Sleep Apnea, Congestive Heart Failure, Coronary Artery Disease, Hypertension, Osteoarthritis] [N/A:N/A] Date Acquired: [8:09/25/2020] [N/A:N/A] Weeks of Treatment: [8:0] [N/A:N/A] Wound Status: [8:Open] [N/A:N/A] Measurements L x W x D (cm) [8:1x0.9x0.1] [N/A:N/A] Area (cm) : [8:0.707] [N/A:N/A] Volume (cm) : [8:0.071] [N/A:N/A] % Reduction in Area: [8:0.00%] [N/A:N/A] % Reduction in Volume: [8:0.00%] [N/A:N/A] Classification: [8:Full Thickness Without Exposed Support Structures] [N/A:N/A] Exudate Amount: [8:Medium] [N/A:N/A] Exudate Type: [8:Serosanguineous] [N/A:N/A]  Exudate Color: [8:red, brown] [N/A:N/A] Granulation Amount: [8:Medium (34-66%)] [N/A:N/A] Granulation Quality: [8:Red] [N/A:N/A] Necrotic Amount: [8:Medium (34-66%)] [N/A:N/A] Exposed Structures: [8:Fat Layer (Subcutaneous Tissue): Yes Fascia: No Tendon: No Muscle: No Joint: No Bone: No None] [N/A:N/A N/A] Treatment Notes Electronic Signature(s) Signed: 10/17/2020 5:19:05 PM By: Elliot GurneyWoody, BSN, RN, CWS, [name] RN, BSN Entered By: Elliot GurneyWoody, BSN, RN, CWS, [name] on 10/17/2020 10:07:04 [name] QuailsSCHENK, [name] ([id number]) -------------------------------------------------------------------------------- Multi-Disciplinary Care Plan Details Patient Name: [name], [name] Date of Service: 10/17/2020 9:15 AM Medical Record Number: [id number] Patient Account Number: [id number] Date of Birth/Sex: August 23, 1931 (86 y.o. M) Treating RN: Huel CoventryWoody, [name] Primary Care Nahuel Wilbert: Marcelino DusterJohnston, John Other Clinician: Referring Gertude Benito: Marcelino DusterJohnston, John Treating Kailin Leu/Extender: Rowan BlaseStone,  Hoyt Weeks in Treatment: 0 Active Inactive Orientation to the Wound Care Program Nursing Diagnoses: Knowledge deficit related to the wound healing center program Goals: Patient/caregiver will verbalize understanding of the Wound Healing Center Program Date Initiated: 10/17/2020 Target Resolution Date: 10/24/2020 Goal Status: Active Interventions: Provide education on orientation to the wound center Notes: Venous Leg Ulcer Nursing Diagnoses: Actual venous Insuffiency (use after diagnosis is confirmed) Potential for venous Insuffiency (use before diagnosis confirmed) Goals: Patient will maintain optimal edema control Date Initiated: 10/17/2020 Target Resolution Date: 10/31/2020 Goal Status: Active Interventions: Assess peripheral edema status every visit. Treatment Activities: Therapeutic compression applied : 10/17/2020 Notes: Wound/Skin Impairment Nursing Diagnoses: Impaired tissue integrity Goals: Patient/caregiver will verbalize understanding of skin care regimen Date Initiated: 10/17/2020 Target Resolution Date: 11/16/2020 Goal Status: Active Ulcer/skin breakdown will have a volume reduction of 30% by week 4 Date Initiated: 10/17/2020 Target Resolution Date: 12/18/2019 Goal Status: Active Ulcer/skin breakdown will have a volume reduction of 50% by week 8 Date Initiated: 10/17/2020 Target Resolution Date: 01/18/2020 Goal Status: Active Ulcer/skin breakdown will have a volume reduction of 80% by week 12 Date Initiated: 10/17/2020 Target Resolution Date: 02/15/2020 Goal Status: Active Ulcer/skin breakdown will heal within 14 weeks Date Initiated: 10/17/2020 Target Resolution Date: 02/28/2021 [name] QuailsSCHENK, [name] ([id number]) Goal Status: Active Interventions: Assess patient/caregiver ability to obtain necessary supplies Assess ulceration(s) every visit Provide education on ulcer and skin care Treatment Activities: Skin care regimen initiated :  10/17/2020 Notes: Electronic Signature(s) Signed: 10/17/2020 5:19:05 PM By: Elliot GurneyWoody, BSN, RN, CWS, [name] RN, BSN Entered By: Elliot GurneyWoody, BSN, RN, CWS, [name] on 10/17/2020 10:06:27 [name], Sakai A. ([id number]) -------------------------------------------------------------------------------- Pain Assessment Details Patient Name: [name], [name] Date of Service: 10/17/2020 9:15 AM Medical Record Number: [id number] Patient Account Number: [id number] Date of Birth/Sex: August 23, 1931 (86 y.o. M) Treating RN: Rogers BlockerSanchez, Kenia Primary Care Helmi Hechavarria: Marcelino DusterJohnston, John Other Clinician: Referring Teosha Casso: Marcelino DusterJohnston, John Treating Wesly Whisenant/Extender: Rowan BlaseStone, Hoyt Weeks in Treatment: 0 Active Problems Location of Pain Severity and Description of Pain Patient Has Paino No Site Locations Rate the pain. Current Pain Level: 0 Pain Management and Medication Current Pain Management: Electronic Signature(s) Signed: 10/17/2020 4:20:00 PM By: Phillis HaggisSanchez Pereyda, Kenia Entered By: Phillis HaggisSanchez Pereyda, Kenia on 10/17/2020 09:49:07 [name], Aqeel A. ([id number]) -------------------------------------------------------------------------------- Patient/Caregiver Education Details Patient Name: [name], [name] Date of Service: 10/17/2020 9:15 AM Medical Record Number: [id number] Patient Account Number: [id number] Date of Birth/Gender: August 23, 1931 (86 y.o. M) Treating RN: Huel CoventryWoody, [name] Primary Care Physician: Marcelino DusterJohnston, John Other Clinician: Referring Physician: Marcelino DusterJohnston, John Treating Physician/Extender: Rowan BlaseStone, Hoyt Weeks in Treatment: 0 Education Assessment Education Provided To: Patient Education Topics Provided Welcome To The Wound Care Center: Handouts: Welcome To The Wound Care Center Methods: Demonstration, Explain/Verbal Responses: State content correctly Wound/Skin Impairment: Handouts: Caring for Your Ulcer Methods: Demonstration, Explain/Verbal Responses: State content correctly Electronic Signature(s)  Signed:  10/17/2020 5:19:05 PM By: Elliot Gurney, BSN, RN, CWS, [name] RN, BSN Entered By: Elliot Gurney, BSN, RN, CWS, [name] on 10/17/2020 10:10:56 [name] ([id number]) -------------------------------------------------------------------------------- Wound Assessment Details Patient Name: [name] Hilts A. Date of Service: 10/17/2020 9:15 AM Medical Record Number: [id number] Patient Account Number: [id number] Date of Birth/Sex: 08-24-1931 (86 y.o. M) Treating RN: Rogers Blocker Primary Care Jamarri Vuncannon: [name] Other Clinician: Referring Mckinnon Glick: [name] Treating Paradise Vensel/Extender: Allen Derry Weeks in Treatment: 0 Wound Status Wound Number: 8 Primary Venous Leg Ulcer Etiology: Wound Location: Left, Medial Lower Leg Wound Open Wounding Event: Gradually Appeared Status: Date Acquired: 09/25/2020 Comorbid Cataracts, Sleep Apnea, Congestive Heart Failure, Weeks Of Treatment: 0 History: Coronary Artery Disease, Hypertension, Osteoarthritis Clustered Wound: No Photos Wound Measurements Length: (cm) 1 Width: (cm) 0.9 Depth: (cm) 0.1 Area: (cm) 0.707 Volume: (cm) 0.071 % Reduction in Area: 0% % Reduction in Volume: 0% Epithelialization: None Tunneling: No Undermining: No Wound Description Classification: Full Thickness Without Exposed Support Structu Exudate Amount: Medium Exudate Type: Serosanguineous Exudate Color: red, brown res Foul Odor After Cleansing: No Slough/Fibrino Yes Wound Bed Granulation Amount: Medium (34-66%) Exposed Structure Granulation Quality: Red Fascia Exposed: No Necrotic Amount: Medium (34-66%) Fat Layer (Subcutaneous Tissue) Exposed: Yes Necrotic Quality: Adherent Slough Tendon Exposed: No Muscle Exposed: No Joint Exposed: No Bone Exposed: No Treatment Notes Wound #8 (Left, Medial Lower Leg) 1. Cleansed with: Clean wound with Normal Saline 3. Peri-wound Care: Barrier cream 4. Dressing Applied: [name], [name] ([id number]) Calcium Alginate with  Silver 5. Secondary Dressing Applied ABD Pad Kerlix/Conform Notes tubigrip size F Electronic Signature(s) Signed: 10/17/2020 3:53:56 PM By: Shawn Stall Signed: 10/17/2020 4:20:00 PM By: Phillis Haggis, Dondra Prader Entered By: Shawn Stall on 10/17/2020 10:50:34 [name] Hilts A. ([id number]) -------------------------------------------------------------------------------- Vitals Details Patient Name: [name] Hilts A. Date of Service: 10/17/2020 9:15 AM Medical Record Number: [id number] Patient Account Number: [id number] Date of Birth/Sex: 03-May-1931 (86 y.o. M) Treating RN: Rogers Blocker Primary Care Christen Bedoya: [name] Other Clinician: Referring Darrian Goodwill: [name] Treating Margalit Leece/Extender: Allen Derry Weeks in Treatment: 0 Vital Signs Time Taken: 09:40 Temperature (F): 97.6 Pulse (bpm): 64 Respiratory Rate (breaths/min): 20 Blood Pressure (mmHg): 121/76 Reference Range: 80 - 120 mg / dl Electronic Signature(s) Signed: 10/17/2020 4:20:00 PM By: Phillis Haggis, Dondra Prader Entered By: Phillis Haggis, Dondra Prader on 10/17/2020 09:49:32

## 2020-10-17 NOTE — Progress Notes (Signed)
ADI, DORO ([id number]) Visit Report for 10/17/2020 Abuse/Suicide Risk Screen Details Patient Name: [name], [name] Date of Service: 10/17/2020 9:15 AM Medical Record Number: [id number] Patient Account Number: [id number] Date of Birth/Sex: August 15, 1931 (86 y.o. M) Treating RN: Rogers Blocker Primary Care Fae Blossom: [name] Other Clinician: Referring Korby Ratay: [name] Treating Haygen Zebrowski/Extender: Rowan Blase in Treatment: 0 Abuse/Suicide Risk Screen Items Answer ABUSE RISK SCREEN: Has anyone close to you tried to hurt or harm you recentlyo No Do you feel uncomfortable with anyone in your familyo No Has anyone forced you do things that you didnot want to doo No Electronic Signature(s) Signed: 10/17/2020 4:20:00 PM By: Phillis Haggis, Dondra Prader Entered By: Phillis Haggis, Kenia on 10/17/2020 09:43:04 [name] Hilts A. ([id number]) -------------------------------------------------------------------------------- Activities of Daily Living Details Patient Name: [name] Hilts A. Date of Service: 10/17/2020 9:15 AM Medical Record Number: [id number] Patient Account Number: [id number] Date of Birth/Sex: [birth year] (86 y.o. M) Treating RN: Rogers Blocker Primary Care Mateus Rewerts: [name] Other Clinician: Referring Trigg Delarocha: [name] Treating Elzie Knisley/Extender: Rowan Blase in Treatment: 0 Activities of Daily Living Items Answer Activities of Daily Living (Please select one for each item) Drive Automobile Not Able Take Medications Need Assistance Use Telephone Need Assistance Care for Appearance Need Assistance Use Toilet Need Assistance Bath / Shower Need Assistance Dress Self Need Assistance Feed Self Need Assistance Walk Need Assistance Get In / Out Bed Need Assistance Housework Need Assistance Prepare Meals Need Assistance Handle Money Need Assistance Shop for Self Need Assistance Electronic Signature(s) Signed: 10/17/2020 4:20:00 PM By:  Phillis Haggis, Dondra Prader Entered By: Phillis Haggis, Dondra Prader on 10/17/2020 09:43:54 [name] Hilts A. ([id number]) -------------------------------------------------------------------------------- Education Screening Details Patient Name: [name] Hilts A. Date of Service: 10/17/2020 9:15 AM Medical Record Number: [id number] Patient Account Number: [id number] Date of Birth/Sex: 08/10/1931 (86 y.o. M) Treating RN: Rogers Blocker Primary Care Skyeler Smola: [name] Other Clinician: Referring Jaleyah Longhi: [name] Treating Darrelyn Morro/Extender: Rowan Blase in Treatment: 0 Primary Learner Assessed: Patient Learning Preferences/Education Level/Primary Language Learning Preference: Explanation Highest Education Level: High School Preferred Language: English Cognitive Barrier Language Barrier: No Translator Needed: No Memory Deficit: No Emotional Barrier: No Cultural/Religious Beliefs Affecting Medical Care: No Physical Barrier Impaired Vision: Yes Glasses Impaired Hearing: Yes Hearing Aid Decreased Hand dexterity: No Knowledge/Comprehension Knowledge Level: Medium Comprehension Level: Medium Ability to understand written instructions: Medium Ability to understand verbal instructions: Medium Motivation Anxiety Level: Calm Cooperation: Cooperative Education Importance: Acknowledges Need Interest in Health Problems: Asks Questions Perception: Coherent Willingness to Engage in Self-Management Medium Activities: Readiness to Engage in Self-Management Medium Activities: Electronic Signature(s) Signed: 10/17/2020 4:20:00 PM By: Phillis Haggis, Dondra Prader Entered By: Phillis Haggis, Dondra Prader on 10/17/2020 09:46:08 [name] Hilts A. ([id number]) -------------------------------------------------------------------------------- Fall Risk Assessment Details Patient Name: [name] Hilts A. Date of Service: 10/17/2020 9:15 AM Medical Record Number: [id number] Patient Account Number:  [id number] Date of Birth/Sex: 03-30-1931 (86 y.o. M) Treating RN: Rogers Blocker Primary Care Tamirra Sienkiewicz: [name] Other Clinician: Referring Ephram Kornegay: [name] Treating Gargi Berch/Extender: Rowan Blase in Treatment: 0 Fall Risk Assessment Items Have you had 2 or more falls in the last 12 monthso 0 No Have you had any fall that resulted in injury in the last 12 monthso 0 No FALLS RISK SCREEN History of falling - immediate or within 3 months 0 No Secondary diagnosis (Do you have 2 or more medical diagnoseso) 15 Yes Ambulatory aid None/bed rest/wheelchair/nurse 0 Yes Crutches/cane/walker 0 No Furniture 0 No Intravenous therapy Access/Saline/Heparin Lock 0 No Gait/Transferring Normal/ bed rest/ wheelchair 0  Yes Weak (short steps with or without shuffle, stooped but able to lift head while walking, may 0 No seek support from furniture) Impaired (short steps with shuffle, may have difficulty arising from chair, head down, impaired 0 No balance) Mental Status Oriented to own ability 0 Yes Electronic Signature(s) Signed: 10/17/2020 4:20:00 PM By: Phillis Haggis, Dondra Prader Entered By: Phillis Haggis, Kenia on 10/17/2020 09:52:32 [name] Hilts A. ([id number]) -------------------------------------------------------------------------------- Foot Assessment Details Patient Name: [name] Hilts A. Date of Service: 10/17/2020 9:15 AM Medical Record Number: [id number] Patient Account Number: [id number] Date of Birth/Sex: Nov 16, 1931 (86 y.o. M) Treating RN: Rogers Blocker Primary Care Landy Dunnavant: [name] Other Clinician: Referring Verdia Bolt: [name] Treating Ndrew Creason/Extender: Allen Derry Weeks in Treatment: 0 Foot Assessment Items Site Locations + = Sensation present, - = Sensation absent, C = Callus, U = Ulcer R = Redness, W = Warmth, M = Maceration, PU = Pre-ulcerative lesion F = Fissure, S = Swelling, D = Dryness Assessment Right: Left: Other Deformity: No  No Prior Foot Ulcer: No No Prior Amputation: No No Charcot Joint: No No Ambulatory Status: Non-ambulatory Assistance Device: Wheelchair Gait: Surveyor, mining) Signed: 10/17/2020 4:20:00 PM By: Phillis Haggis, Dondra Prader Entered By: Phillis Haggis, Dondra Prader on 10/17/2020 09:52:58 [name] Hilts A. ([id number]) -------------------------------------------------------------------------------- Nutrition Risk Screening Details Patient Name: [name] Hilts A. Date of Service: 10/17/2020 9:15 AM Medical Record Number: [id number] Patient Account Number: [id number] Date of Birth/Sex: 1931/09/22 (86 y.o. M) Treating RN: Rogers Blocker Primary Care Mariam Helbert: [name] Other Clinician: Referring Penni Penado: [name] Treating Lennyx Verdell/Extender: Allen Derry Weeks in Treatment: 0 Height (in): Weight (lbs): Body Mass Index (BMI): Nutrition Risk Screening Items Score Screening NUTRITION RISK SCREEN: I have an illness or condition that made me change the kind and/or amount of food I eat 0 No I eat fewer than two meals per day 0 No I eat few fruits and vegetables, or milk products 0 No I have three or more drinks of beer, liquor or wine almost every day 0 No I have tooth or mouth problems that make it hard for me to eat 0 No I don't always have enough money to buy the food I need 0 No I eat alone most of the time 0 No I take three or more different prescribed or over-the-counter drugs a day 0 No Without wanting to, I have lost or gained 10 pounds in the last six months 0 No I am not always physically able to shop, cook and/or feed myself 0 No Nutrition Protocols Good Risk Protocol 0 No interventions needed Moderate Risk Protocol High Risk Proctocol Risk Level: Good Risk Score: 0 Electronic Signature(s) Signed: 10/17/2020 4:20:00 PM By: Phillis Haggis, Dondra Prader Entered By: Phillis Haggis, Dondra Prader on 10/17/2020 09:52:45

## 2020-10-17 NOTE — Progress Notes (Signed)
JULIOUS, LANGLOIS ([id number]) Visit Report for 10/17/2020 Chief Complaint Document Details Patient Name: [name], [name] Date of Service: 10/17/2020 9:15 AM Medical Record Number: [id number] Patient Account Number: [id number] Date of Birth/Sex: 1931-02-16 (86 y.o. M) Treating RN: Huel Coventry Primary Care Provider: [name] Other Clinician: Referring Provider: [name] Treating Provider/Extender: Rowan Blase in Treatment: 0 Information Obtained from: Patient Chief Complaint Left Medical LE Ulcers Electronic Signature(s) Signed: 10/17/2020 9:57:44 AM By: Lenda Kelp PA-C Entered By: Lenda Kelp on 10/17/2020 09:57:44 [name] Hilts A. ([id number]) -------------------------------------------------------------------------------- HPI Details Patient Name: [name] Hilts A. Date of Service: 10/17/2020 9:15 AM Medical Record Number: [id number] Patient Account Number: [id number] Date of Birth/Sex: [birth year] (86 y.o. M) Treating RN: Huel Coventry Primary Care Provider: [name] Other Clinician: Referring Provider: [name] Treating Provider/Extender: Rowan Blase in Treatment: 0 History of Present Illness HPI Description: 04/21/2019 ADMISSION This is an independent 86 year old man who lives in the independent part of [address] of [place]. He has a history of chronic lower extremity edema and wears compression stockings. He states about a month ago he was taking off the one on the right and pulled some skin off accidentally. He has had 2 small wounds on the right anterior and right medial lower extremity. I note looking through [facility] health link that he had multiple venous ultrasounds in 2015 and 16. These were DVT rule outs. He did have a Baker's cyst on the left. He has not not had a previous wound history although he did have a history of cellulitis in his legs. Past medical history; includes aortic stenosis status post mechanical AVR in 2007 on  chronic Coumadin, lower extremity edema with a history of cellulitis, history of MRSA. ABIs in our clinic were 1.1 on the right 6/3; patient with predominantly venous insufficiency ulcers in the right lower leg probably some degree of lymphedema. He had to wounds last week. We put him in 3 layer compression. The nurses at [facility] are changing the dressing. The area laterally is healed but he still has a small very painful area on the anterior tibial area. He is on Coumadin because of mechanical aortic valve. 6/10; venous insufficiency ulcers on the right lower leg. He has some degree of lymphedema. We have been using 3 layer compression with silver collagen small wound. Dimensions are improved. I gave him doxycycline last week which he tolerated because of surrounding erythema. This is improved also 6/17; arrives in clinic today with portable silver collagen dressing tightly adherent to the wound. Also felt that the wrap was too tight 3 layer being changed at the [facility] 6/24; patient's wound is small still adherent debris over the surface however even under illumination it was hard to see what was still open here. We have been using silver collagen 7/1; the patient arrives in the clinic today and the area on the right lower leg is healed. He has severe chronic venous insufficiency. He has 20/30 compression stockings. Readmission: 06/22/2020 on evaluation today patient presents for reevaluation here in our clinic although it has been a little over a year since we last saw him. He does have a history of lymphedema, venous insufficiency, anticoagulant therapy, he is on a pacemaker which is the reason for the anticoagulants, hypertension, and congestive heart failure. With that being said unfortunately he has been dealing with a wound of the left and right legs which has been giving him some trouble since arrival first 2021. That is in  regard to the left leg ulcer. In regard  to the right leg ulcer this is just a very small area that really I think will seal up quite nicely is more of the lymphedema opening than anything else. With that being said unfortunately the left leg is quite significant. We actually have noted that the patient's been dealing with this for quite some time and I think that there is a chance we may want to consider biopsy. With that being said he is on Coumadin therefore we were not able to perform a biopsy at this point. I think that something however in the future that may be warranted we may just have to take him off of the current Coumadin regimen in order to do this. We discussed doing it today but I am more concerned about the fact that he could have issues with uncontrolled bleeding and in that case we may have to send him to the ER. His daughter was not wanting to take that chance which I can completely understand. Nonetheless so far they have been using antibiotic ointment over the area I am just putting a protective dressing I do believe the patient is that he needs some type of compression. 07/07/2020 on evaluation today patient appears to be doing poorly in regard to his bilateral lower extremities today. He actually went to urgent care last night due to having pain in his legs due to the wraps. It appears they got somewhat tight on him because of the fact that he is having such significant swelling. He appears to be fluid overloaded I do not see any signs of infection actively at this point which is good news but unfortunately I think that this is something that may need to be addressed even by cardiology more so than just with a different or alternate type of wrap. I will contact his cardiologist, Dr. Mariah Milling, as well. 07/10/2020 upon evaluation today patient unfortunately appears to be doing worse even than when I saw him on Friday from the standpoint of where his legs stand. The main issue is that he has increased erythema although there  is still not warm to touch I am concerned about infection and about this getting significantly worse. Again my initial concern was more fluid overload and I discussed this with his cardiology office on Friday. With that being said I feel like this has gotten worse his daughter is in agreement and I think that the best option would be to send him to the hospital at this point. READMISSION 09/06/2020 Is a patient who has had 2 stays in this clinic firstly in 2020 and more recently from 06/02/2020 through 07/10/2020. He was cared for by Allen Derry. When he was last here he had bilateral lower extremity ulcers in the setting of chronic venous insufficiency with lymphedema. When he we last saw him he had weeping edema fluido Cellulitis and he was sent to the hospital. He was admitted initially from 06/30/2020 through 07/14/2020 with cellulitis plus stasis dermatitis. Treated empirically with vancomycin and had increases in his torsemide. He was then sent to the skilled level of [place] but readmitted to hospital from 07/29/2020 through 08/10/2020 with blood loss anemia hemoglobin of 4 and OB positive stools. During this hospitalization he was noted to have a sacral wound at stage II. He was sent to [facility] [place] again in the skilled level. He had compression on his legs with Coban. He is now back at his independent living setting. He  has not been wearing compression and his daughter feels there is already increasing swelling. To have stockings at home not exactly sure at what strength. [name], [name] ([id number]) Past medical history includes lymphedema, chronic venous insufficiency, pacemaker, congestive heart failure, hypertension, aortic valve stenosis status post mechanical valve replacement in 2002 on chronic Coumadin, chronic kidney disease stage IIIb, atrial fibrillation, peripheral neuropathy, large hiatal hernia. We did not test his ABIs in the clinic  today. Readmission: 10/17/2020 on evaluation today patient appears to be doing somewhat poorly in regard to his left leg and the fact that he does have an open wound at this point. He has not had the compression socks placed at the facility they have not been putting them on at all unfortunately. He does not have on the right leg currently and apparently there is significant put him on the left leg with the wound which I understand is that can be somewhat tight obviously. Nonetheless I do believe the right leg needs to have a compression sock and I do believe on the left side right now we can do something a little better in order to try to get the wound healed at this point. The patient is on torsemide and currently allopurinol for gout. He also has daily weights due to congestive heart failure I am assuming in order to monitor his fluid status overall. He is currently a DNR at the facility. Electronic Signature(s) Signed: 10/17/2020 10:11:09 AM By: Lenda Kelp PA-C Entered By: Lenda Kelp on 10/17/2020 10:11:09 [name] ([id number]) -------------------------------------------------------------------------------- Physical Exam Details Patient Name: [name] Hilts A. Date of Service: 10/17/2020 9:15 AM Medical Record Number: [id number] Patient Account Number: [id number] Date of Birth/Sex: 1931-01-29 (86 y.o. M) Treating RN: Huel Coventry Primary Care Provider: [name] Other Clinician: Referring Provider: [name] Treating Provider/Extender: Allen Derry Weeks in Treatment: 0 Constitutional sitting or standing blood pressure is within target range for patient.. pulse regular and within target range for patient.Marland Kitchen respirations regular, non- labored and within target range for patient.Marland Kitchen temperature within target range for patient.. Well-nourished and well-hydrated in no acute distress. Eyes conjunctiva clear no eyelid edema noted. pupils equal round and reactive to  light and accommodation. Ears, Nose, Mouth, and Throat no gross abnormality of ear auricles or external auditory canals. normal hearing noted during conversation. mucus membranes moist. Respiratory normal breathing without difficulty. Cardiovascular 1+ pitting edema of the bilateral lower extremities. Musculoskeletal Patient unable to walk without assistance. no significant deformity or arthritic changes, no loss or range of motion, no clubbing. Psychiatric this patient is able to make decisions and demonstrates good insight into disease process. Alert and Oriented x 3. pleasant and cooperative. Notes Upon inspection patient's wound bed actually showed signs of being fairly clean in my opinion. With that being said currently a Vaseline gauze is being utilized and obviously I think he needs to have something to keep this more dry and absorb this. We will likely switch him over to a silver alginate dressing at this point which I think will do much better. Electronic Signature(s) Signed: 10/17/2020 10:16:02 AM By: Lenda Kelp PA-C Entered By: Lenda Kelp on 10/17/2020 10:16:01 [name] ([id number]) -------------------------------------------------------------------------------- Physician Orders Details Patient Name: [name] Hilts A. Date of Service: 10/17/2020 9:15 AM Medical Record Number: [id number] Patient Account Number: [id number] Date of Birth/Sex: [birth year] (86 y.o. M) Treating RN: Huel Coventry Primary Care Provider: [name] Other Clinician: Referring Provider: [name] Treating Provider/Extender: Allen Derry Weeks in  Treatment: 0 Verbal / Phone Orders: No Diagnosis Coding ICD-10 Coding Code Description I89.0 Lymphedema, not elsewhere classified I87.2 Venous insufficiency (chronic) (peripheral) L97.822 Non-pressure chronic ulcer of other part of left lower leg with fat layer exposed L97.812 Non-pressure chronic ulcer of other part of right lower  leg with fat layer exposed Z79.01 Long term (current) use of anticoagulants Z95.0 Presence of cardiac pacemaker I10 Essential (primary) hypertension I50.42 Chronic combined systolic (congestive) and diastolic (congestive) heart failure I25.10 Atherosclerotic heart disease of native coronary artery without angina pectoris Wound Cleansing Wound #8 Left,Medial Lower Leg o Dial antibacterial soap, wash wounds, rinse and pat dry prior to dressing wounds o May Shower, gently pat wound dry prior to applying new dressing. Anesthetic (add to Medication List) Wound #8 Left,Medial Lower Leg o Topical Lidocaine 4% cream applied to wound bed prior to debridement (In Clinic Only). Skin Barriers/Peri-Wound Care Wound #8 Left,Medial Lower Leg o Barrier cream Primary Wound Dressing Wound #8 Left,Medial Lower Leg o Silver Alginate Secondary Dressing Wound #8 Left,Medial Lower Leg o ABD and Kerlix/Conform Dressing Change Frequency Wound #8 Left,Medial Lower Leg o Change Dressing Monday, Wednesday, Friday Follow-up Appointments Wound #8 Left,Medial Lower Leg o Return Appointment in 2 weeks. Edema Control Wound #8 Left,Medial Lower Leg o Patient to wear own compression stockings o Other: - [name] ([id number]) Additional Orders / Instructions Wound #8 Left,Medial Lower Leg o Increase protein intake. Electronic Signature(s) Signed: 10/17/2020 5:19:05 PM By: Elliot GurneyWoody, BSN, RN, CWS, [name] RN, BSN Signed: 10/17/2020 5:21:29 PM By: Lenda KelpStone III, Rhyan Radler PA-C Entered By: Elliot GurneyWoody, BSN, RN, CWS, [name] on 10/17/2020 10:09:27 [name] QuailsSCHENK, Nghia A. ([id number]) -------------------------------------------------------------------------------- Problem List Details Patient Name: [name] HiltsSCHENK, [name] Date of Service: 10/17/2020 9:15 AM Medical Record Number: [id number] Patient Account Number: [id number] Date of Birth/Sex: 1931/04/11 (86 y.o. M) Treating RN: Huel CoventryWoody, [name] Primary  Care Provider: Marcelino DusterJohnston, John Other Clinician: Referring Provider: Marcelino DusterJohnston, John Treating Provider/Extender: Rowan BlaseStone, Timur Nibert Weeks in Treatment: 0 Active Problems ICD-10 Encounter Code Description Active Date MDM Diagnosis I89.0 Lymphedema, not elsewhere classified 10/17/2020 No Yes I87.2 Venous insufficiency (chronic) (peripheral) 10/17/2020 No Yes L97.822 Non-pressure chronic ulcer of other part of left lower leg with fat layer 10/17/2020 No Yes exposed L97.812 Non-pressure chronic ulcer of other part of right lower leg with fat layer 10/17/2020 No Yes exposed Z79.01 Long term (current) use of anticoagulants 10/17/2020 No Yes Z95.0 Presence of cardiac pacemaker 10/17/2020 No Yes I10 Essential (primary) hypertension 10/17/2020 No Yes I50.42 Chronic combined systolic (congestive) and diastolic (congestive) heart 10/17/2020 No Yes failure I25.10 Atherosclerotic heart disease of native coronary artery without angina 10/17/2020 No Yes pectoris Inactive Problems Resolved Problems Electronic Signature(s) Signed: 10/17/2020 9:43:00 AM By: Lenda KelpStone III, Krystel Fletchall PA-C Entered By: Lenda KelpStone III, Zaid Tomes on 10/17/2020 09:43:00 [name], Anush A. ([id number]) -------------------------------------------------------------------------------- Progress Note Details Patient Name: [name] HiltsSCHENK, [name] Date of Service: 10/17/2020 9:15 AM Medical Record Number: [id number] Patient Account Number: [id number] Date of Birth/Sex: 1931/04/11 (86 y.o. M) Treating RN: Huel CoventryWoody, [name] Primary Care Provider: Marcelino DusterJohnston, John Other Clinician: Referring Provider: Marcelino DusterJohnston, John Treating Provider/Extender: Rowan BlaseStone, Add Dinapoli Weeks in Treatment: 0 Subjective Chief Complaint Information obtained from Patient Left Medical LE Ulcers History of Present Illness (HPI) 04/21/2019 ADMISSION This is an independent 86 year old man who lives in the independent part of [address]. He has a history of chronic lower extremity edema and wears  compression stockings. He states about a month ago he was taking off the one on the right and pulled some skin off  accidentally. He has had 2 small wounds on the right anterior and right medial lower extremity. I note looking through [facility] health link that he had multiple venous ultrasounds in 2015 and 16. These were DVT rule outs. He did have a Baker's cyst on the left. He has not not had a previous wound history although he did have a history of cellulitis in his legs. Past medical history; includes aortic stenosis status post mechanical AVR in 2007 on chronic Coumadin, lower extremity edema with a history of cellulitis, history of MRSA. ABIs in our clinic were 1.1 on the right 6/3; patient with predominantly venous insufficiency ulcers in the right lower leg probably some degree of lymphedema. He had to wounds last week. We put him in 3 layer compression. The nurses at [facility] are changing the dressing. The area laterally is healed but he still has a small very painful area on the anterior tibial area. He is on Coumadin because of mechanical aortic valve. 6/10; venous insufficiency ulcers on the right lower leg. He has some degree of lymphedema. We have been using 3 layer compression with silver collagen small wound. Dimensions are improved. I gave him doxycycline last week which he tolerated because of surrounding erythema. This is improved also 6/17; arrives in clinic today with portable silver collagen dressing tightly adherent to the wound. Also felt that the wrap was too tight 3 layer being changed at the [facility] 6/24; patient's wound is small still adherent debris over the surface however even under illumination it was hard to see what was still open here. We have been using silver collagen 7/1; the patient arrives in the clinic today and the area on the right lower leg is healed. He has severe chronic venous insufficiency. He has 20/30 compression  stockings. Readmission: 06/22/2020 on evaluation today patient presents for reevaluation here in our clinic although it has been a little over a year since we last saw him. He does have a history of lymphedema, venous insufficiency, anticoagulant therapy, he is on a pacemaker which is the reason for the anticoagulants, hypertension, and congestive heart failure. With that being said unfortunately he has been dealing with a wound of the left and right legs which has been giving him some trouble since arrival first 2021. That is in regard to the left leg ulcer. In regard to the right leg ulcer this is just a very small area that really I think will seal up quite nicely is more of the lymphedema opening than anything else. With that being said unfortunately the left leg is quite significant. We actually have noted that the patient's been dealing with this for quite some time and I think that there is a chance we may want to consider biopsy. With that being said he is on Coumadin therefore we were not able to perform a biopsy at this point. I think that something however in the future that may be warranted we may just have to take him off of the current Coumadin regimen in order to do this. We discussed doing it today but I am more concerned about the fact that he could have issues with uncontrolled bleeding and in that case we may have to send him to the ER. His daughter was not wanting to take that chance which I can completely understand. Nonetheless so far they have been using antibiotic ointment over the area I am just putting a protective dressing I do believe the patient is that he  needs some type of compression. 07/07/2020 on evaluation today patient appears to be doing poorly in regard to his bilateral lower extremities today. He actually went to urgent care last night due to having pain in his legs due to the wraps. It appears they got somewhat tight on him because of the fact that he is  having such significant swelling. He appears to be fluid overloaded I do not see any signs of infection actively at this point which is good news but unfortunately I think that this is something that may need to be addressed even by cardiology more so than just with a different or alternate type of wrap. I will contact his cardiologist, Dr. Mariah Milling, as well. 07/10/2020 upon evaluation today patient unfortunately appears to be doing worse even than when I saw him on Friday from the standpoint of where his legs stand. The main issue is that he has increased erythema although there is still not warm to touch I am concerned about infection and about this getting significantly worse. Again my initial concern was more fluid overload and I discussed this with his cardiology office on Friday. With that being said I feel like this has gotten worse his daughter is in agreement and I think that the best option would be to send him to the hospital at this point. READMISSION 09/06/2020 Is a patient who has had 2 stays in this clinic firstly in 2020 and more recently from 06/02/2020 through 07/10/2020. He was cared for by Allen Derry. When he was last here he had bilateral lower extremity ulcers in the setting of chronic venous insufficiency with lymphedema. When he we last saw him he had weeping edema fluido Cellulitis and he was sent to the hospital. He was admitted initially from 06/30/2020 through 07/14/2020 [facility], Yehonatan A. ([id number]) with cellulitis plus stasis dermatitis. Treated empirically with vancomycin and had increases in his torsemide. He was then sent to the skilled level of [place] but readmitted to hospital from 07/29/2020 through 08/10/2020 with blood loss anemia hemoglobin of 4 and OB positive stools. During this hospitalization he was noted to have a sacral wound at stage II. He was sent to [facility] [place] again in the skilled level. He had compression on his legs with Coban. He is  now back at his independent living setting. He has not been wearing compression and his daughter feels there is already increasing swelling. To have stockings at home not exactly sure at what strength. Past medical history includes lymphedema, chronic venous insufficiency, pacemaker, congestive heart failure, hypertension, aortic valve stenosis status post mechanical valve replacement in 2002 on chronic Coumadin, chronic kidney disease stage IIIb, atrial fibrillation, peripheral neuropathy, large hiatal hernia. We did not test his ABIs in the clinic today. Readmission: 10/17/2020 on evaluation today patient appears to be doing somewhat poorly in regard to his left leg and the fact that he does have an open wound at this point. He has not had the compression socks placed at the facility they have not been putting them on at all unfortunately. He does not have on the right leg currently and apparently there is significant put him on the left leg with the wound which I understand is that can be somewhat tight obviously. Nonetheless I do believe the right leg needs to have a compression sock and I do believe on the left side right now we can do something a little better in order to try to get the wound healed at this  point. The patient is on torsemide and currently allopurinol for gout. He also has daily weights due to congestive heart failure I am assuming in order to monitor his fluid status overall. He is currently a DNR at the facility. Patient History Information obtained from Patient. Allergies Levaquin, Sulfa (Sulfonamide Antibiotics) Family History Diabetes - Mother, Heart Disease - Mother,Father, Hypertension - Mother,Father, Stroke - Father,Siblings, No family history of Cancer, Hereditary Spherocytosis, Kidney Disease, Lung Disease, Seizures, Thyroid Problems, Tuberculosis. Social History Former smoker - 1975, Marital Status - Married, Alcohol Use - Moderate, Drug Use - No History,  Caffeine Use - Daily. Medical History Eyes Patient has history of Cataracts Denies history of Glaucoma, Optic Neuritis Respiratory Patient has history of Sleep Apnea Denies history of Aspiration, Asthma, Chronic Obstructive Pulmonary Disease (COPD), Pneumothorax, Tuberculosis Cardiovascular Patient has history of Congestive Heart Failure, Coronary Artery Disease, Hypertension Denies history of Angina, Arrhythmia, Deep Vein Thrombosis, Hypotension, Myocardial Infarction, Peripheral Arterial Disease, Peripheral Venous Disease, Phlebitis, Vasculitis Gastrointestinal Denies history of Cirrhosis , Colitis, Crohn s, Hepatitis A, Hepatitis B, Hepatitis C Integumentary (Skin) Denies history of History of Burn, History of pressure wounds Musculoskeletal Patient has history of Osteoarthritis Denies history of Gout, Rheumatoid Arthritis, Osteomyelitis Medical And Surgical History Notes Cardiovascular bradycardia, aortic valve replaced 2002, carotid bruits Gastrointestinal hx GI bleed Objective Constitutional sitting or standing blood pressure is within target range for patient.. pulse regular and within target range for patient.Marland Kitchen respirations regular, non- labored and within target range for patient.Marland Kitchen temperature within target range for patient.. Well-nourished and well-hydrated in no acute distress. [name], [name] ([id number]) Vitals Time Taken: 9:40 AM, Temperature: 97.6 F, Pulse: 64 bpm, Respiratory Rate: 20 breaths/min, Blood Pressure: 121/76 mmHg. Eyes conjunctiva clear no eyelid edema noted. pupils equal round and reactive to light and accommodation. Ears, Nose, Mouth, and Throat no gross abnormality of ear auricles or external auditory canals. normal hearing noted during conversation. mucus membranes moist. Respiratory normal breathing without difficulty. Cardiovascular 1+ pitting edema of the bilateral lower extremities. Musculoskeletal Patient unable to walk without  assistance. no significant deformity or arthritic changes, no loss or range of motion, no clubbing. Psychiatric this patient is able to make decisions and demonstrates good insight into disease process. Alert and Oriented x 3. pleasant and cooperative. General Notes: Upon inspection patient's wound bed actually showed signs of being fairly clean in my opinion. With that being said currently a Vaseline gauze is being utilized and obviously I think he needs to have something to keep this more dry and absorb this. We will likely switch him over to a silver alginate dressing at this point which I think will do much better. Integumentary (Hair, Skin) Wound #8 status is Open. Original cause of wound was Gradually Appeared. The wound is located on the Left,Medial Lower Leg. The wound measures 1cm length x 0.9cm width x 0.1cm depth; 0.707cm^2 area and 0.071cm^3 volume. There is Fat Layer (Subcutaneous Tissue) exposed. There is no tunneling or undermining noted. There is a medium amount of serosanguineous drainage noted. There is medium (34-66%) red granulation within the wound bed. There is a medium (34-66%) amount of necrotic tissue within the wound bed including Adherent Slough. 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 Anesthetic (add to Medication List): Wound #8 Left,Medial Lower Leg: Topical Lidocaine 4% cream applied to wound bed prior to debridement (In Clinic Only). Skin Barriers/Peri-Wound Care: Wound #8 Left,Medial Lower Leg: Barrier cream Primary Wound Dressing: Wound #8 Left,Medial Lower Leg: Silver Alginate Secondary Dressing: Wound #8 Left,Medial Lower Leg: ABD and Kerlix/Conform Dressing Change Frequency: Wound #8 Left,Medial Lower Leg: Change Dressing Monday, Wednesday, Friday Follow-up Appointments: [name], [name] ([id number]) Wound #8 Left,Medial Lower Leg: Return Appointment in 2 weeks. Edema Control: Wound #8 Left,Medial Lower Leg: Patient to wear own compression stockings Other: - Tubi-F in the clinic Additional Orders / Instructions: Wound #8 Left,Medial Lower Leg: Increase protein intake. 1. I would recommend currently that we go ahead and initiate treatment with a silver alginate dressing to the left leg at this point. Will use an ABD pad and Tubigrip to cover. 2. I am also can recommend at this time that we have the patient on the right leg use his compression stocking he should have this put on first in the morning and not come off until bedtime at night. 3. I am also can recommend he should be try to elevate his legs much as possible when he is in his recliner or even when he is in bed. He should not be sitting up for long periods of time as this will cause increased and worsening swelling overall. 4. I did send specific orders back regarding the use of compression stockings on the right leg until we can use them on both legs once the wound heals. We will see patient back for reevaluation in 2 weeks here in the clinic. If anything worsens or changes patient will contact our office for additional recommendations. Electronic Signature(s) Signed: 10/17/2020 10:17:02 AM By: Lenda Kelp PA-C Entered By: Lenda Kelp on 10/17/2020 10:17:01 [name] ([id number]) -------------------------------------------------------------------------------- ROS/PFSH Details Patient Name: [name] Hilts A. Date of Service: 10/17/2020 9:15 AM Medical Record Number: [id number] Patient Account Number: [id number] Date of Birth/Sex: [birth year] (86 y.o. M) Treating RN: Rogers Blocker Primary Care Provider: [name] Other Clinician: Referring Provider: [name] Treating Provider/Extender: Rowan Blase in Treatment: 0 Information Obtained From Patient Eyes Medical History: Positive for: Cataracts Negative for: Glaucoma; Optic Neuritis Respiratory Medical History: Positive for: Sleep Apnea Negative for: Aspiration; Asthma; Chronic Obstructive Pulmonary Disease (COPD); Pneumothorax; Tuberculosis Cardiovascular Medical History: Positive for: Congestive Heart Failure; Coronary Artery Disease; Hypertension Negative for: Angina; Arrhythmia; Deep Vein Thrombosis; Hypotension; Myocardial Infarction; Peripheral Arterial Disease; Peripheral Venous Disease; Phlebitis; Vasculitis Past Medical History Notes: bradycardia, aortic valve replaced 2002, carotid bruits Gastrointestinal Medical History: Negative for: Cirrhosis ; Colitis; Crohnos; Hepatitis A; Hepatitis B; Hepatitis C Past Medical History Notes: hx GI bleed Integumentary (Skin) Medical History: Negative for: History of Burn; History of pressure wounds Musculoskeletal Medical History: Positive for: Osteoarthritis Negative for: Gout; Rheumatoid Arthritis; Osteomyelitis HBO Extended History Items Eyes: Cataracts Immunizations Pneumococcal Vaccine: Received Pneumococcal Vaccination: Yes Immunization Notes: up to date Implantable Devices Yes Family and Social History [name], [name] ([id number]) Cancer: No; Diabetes: Yes - Mother; Heart Disease: Yes - Mother,Father; Hereditary Spherocytosis: No; Hypertension: Yes - Mother,Father; Kidney Disease: No; Lung Disease:  No; Seizures: No; Stroke: Yes -  Father,Siblings; Thyroid Problems: No; Tuberculosis: No; Former smoker - 1975; Marital Status - Married; Alcohol Use: Moderate; Drug Use: No History; Caffeine Use: Daily; Financial Concerns: No; Food, Clothing or Shelter Needs: No; Support System Lacking: No; Transportation Concerns: No Electronic Signature(s) Signed: 10/17/2020 4:20:00 PM By: Phillis Haggis, Dondra Prader Signed: 10/17/2020 5:21:29 PM By: Lenda Kelp PA-C Entered By: Phillis Haggis, Dondra Prader on 10/17/2020 09:42:25 [name] Hilts A. ([id number]) -------------------------------------------------------------------------------- SuperBill Details Patient Name: [name] Hilts A. Date of Service: 10/17/2020 Medical Record Number: [id number] Patient Account Number: [id number] Date of Birth/Sex: 12-May-1931 (86 y.o. M) Treating RN: Huel Coventry Primary Care Provider: [name] Other Clinician: Referring Provider: [name] Treating Provider/Extender: Allen Derry Weeks in Treatment: 0 Diagnosis Coding ICD-10 Codes Code Description I89.0 Lymphedema, not elsewhere classified I87.2 Venous insufficiency (chronic) (peripheral) L97.822 Non-pressure chronic ulcer of other part of left lower leg with fat layer exposed L97.812 Non-pressure chronic ulcer of other part of right lower leg with fat layer exposed Z79.01 Long term (current) use of anticoagulants Z95.0 Presence of cardiac pacemaker I10 Essential (primary) hypertension I50.42 Chronic combined systolic (congestive) and diastolic (congestive) heart failure I25.10 Atherosclerotic heart disease of native coronary artery without angina pectoris Facility Procedures CPT4 Code: [id number] Description: 99213 - WOUND CARE VISIT-LEV 3 EST PT Modifier: Quantity: 1 Physician Procedures CPT4 Code: 3086578 Description: 99214 - WC PHYS LEVEL 4 - EST PT Modifier: Quantity: 1 CPT4 Code: Description: ICD-10 Diagnosis Description I89.0 Lymphedema, not  elsewhere classified I87.2 Venous insufficiency (chronic) (peripheral) L97.822 Non-pressure chronic ulcer of other part of left lower leg with fat lay L97.812 Non-pressure chronic ulcer of  other part of right lower leg with fat la Modifier: er exposed yer exposed Quantity: Electronic Signature(s) Signed: 10/17/2020 10:17:19 AM By: Lenda Kelp PA-C Entered By: Lenda Kelp on 10/17/2020 10:17:19

## 2020-10-25 ENCOUNTER — Other Ambulatory Visit
Admission: RE | Admit: 2020-10-25 | Discharge: 2020-10-25 | Disposition: A | Discharge status: Home / Self Care | Payer: Medicare Other | Source: Ambulatory Visit | Attending: Internal Medicine | Admitting: Internal Medicine

## 2020-10-25 DIAGNOSIS — I4891 Unspecified atrial fibrillation: Secondary | ICD-10-CM | POA: Insufficient documentation

## 2020-10-25 LAB — PROTIME-INR
INR: 2.2 — ABNORMAL HIGH (ref 0.8–1.2)
Prothrombin Time: 23.4 seconds — ABNORMAL HIGH (ref 11.4–15.2)

## 2020-10-30 ENCOUNTER — Emergency Department
Admission: EM | Admit: 2020-10-30 | Discharge: 2020-10-30 | Disposition: A | Discharge status: Home / Self Care | Payer: Medicare Other | Source: Home / Self Care | Attending: Emergency Medicine | Admitting: Emergency Medicine

## 2020-10-30 ENCOUNTER — Encounter: Payer: Self-pay | Admitting: Emergency Medicine

## 2020-10-30 ENCOUNTER — Emergency Department: Payer: Medicare Other

## 2020-10-30 ENCOUNTER — Other Ambulatory Visit: Payer: Self-pay

## 2020-10-30 ENCOUNTER — Encounter: Payer: Medicare Other | Attending: Internal Medicine | Admitting: Physician Assistant

## 2020-10-30 DIAGNOSIS — I130 Hypertensive heart and chronic kidney disease with heart failure and stage 1 through stage 4 chronic kidney disease, or unspecified chronic kidney disease: Secondary | ICD-10-CM | POA: Diagnosis not present

## 2020-10-30 DIAGNOSIS — Z79899 Other long term (current) drug therapy: Secondary | ICD-10-CM | POA: Insufficient documentation

## 2020-10-30 DIAGNOSIS — Z950 Presence of cardiac pacemaker: Secondary | ICD-10-CM | POA: Diagnosis not present

## 2020-10-30 DIAGNOSIS — Z881 Allergy status to other antibiotic agents status: Secondary | ICD-10-CM | POA: Diagnosis not present

## 2020-10-30 DIAGNOSIS — Z87891 Personal history of nicotine dependence: Secondary | ICD-10-CM | POA: Insufficient documentation

## 2020-10-30 DIAGNOSIS — I5042 Chronic combined systolic (congestive) and diastolic (congestive) heart failure: Secondary | ICD-10-CM | POA: Insufficient documentation

## 2020-10-30 DIAGNOSIS — I87323 Chronic venous hypertension (idiopathic) with inflammation of bilateral lower extremity: Secondary | ICD-10-CM | POA: Diagnosis not present

## 2020-10-30 DIAGNOSIS — N1832 Chronic kidney disease, stage 3b: Secondary | ICD-10-CM | POA: Insufficient documentation

## 2020-10-30 DIAGNOSIS — I110 Hypertensive heart disease with heart failure: Secondary | ICD-10-CM | POA: Insufficient documentation

## 2020-10-30 DIAGNOSIS — L089 Local infection of the skin and subcutaneous tissue, unspecified: Secondary | ICD-10-CM | POA: Insufficient documentation

## 2020-10-30 DIAGNOSIS — M79605 Pain in left leg: Secondary | ICD-10-CM | POA: Insufficient documentation

## 2020-10-30 DIAGNOSIS — I5032 Chronic diastolic (congestive) heart failure: Secondary | ICD-10-CM | POA: Insufficient documentation

## 2020-10-30 DIAGNOSIS — Z882 Allergy status to sulfonamides status: Secondary | ICD-10-CM | POA: Insufficient documentation

## 2020-10-30 DIAGNOSIS — Z96649 Presence of unspecified artificial hip joint: Secondary | ICD-10-CM | POA: Insufficient documentation

## 2020-10-30 DIAGNOSIS — T148XXA Other injury of unspecified body region, initial encounter: Secondary | ICD-10-CM

## 2020-10-30 DIAGNOSIS — I890 Lymphedema, not elsewhere classified: Secondary | ICD-10-CM | POA: Insufficient documentation

## 2020-10-30 DIAGNOSIS — I2510 Atherosclerotic heart disease of native coronary artery without angina pectoris: Secondary | ICD-10-CM | POA: Insufficient documentation

## 2020-10-30 DIAGNOSIS — Z7901 Long term (current) use of anticoagulants: Secondary | ICD-10-CM | POA: Insufficient documentation

## 2020-10-30 DIAGNOSIS — I4821 Permanent atrial fibrillation: Secondary | ICD-10-CM | POA: Insufficient documentation

## 2020-10-30 DIAGNOSIS — Z8614 Personal history of Methicillin resistant Staphylococcus aureus infection: Secondary | ICD-10-CM | POA: Insufficient documentation

## 2020-10-30 LAB — LACTIC ACID, PLASMA
Lactic Acid, Venous: 1.2 mmol/L (ref 0.5–1.9)
Lactic Acid, Venous: 1.6 mmol/L (ref 0.5–1.9)

## 2020-10-30 LAB — COMPREHENSIVE METABOLIC PANEL
ALT: 9 U/L (ref 0–44)
AST: 24 U/L (ref 15–41)
Albumin: 3.5 g/dL (ref 3.5–5.0)
Alkaline Phosphatase: 131 U/L — ABNORMAL HIGH (ref 38–126)
Anion gap: 10 (ref 5–15)
BUN: 57 mg/dL — ABNORMAL HIGH (ref 8–23)
CO2: 30 mmol/L (ref 22–32)
Calcium: 8.7 mg/dL — ABNORMAL LOW (ref 8.9–10.3)
Chloride: 92 mmol/L — ABNORMAL LOW (ref 98–111)
Creatinine, Ser: 1.46 mg/dL — ABNORMAL HIGH (ref 0.61–1.24)
GFR, Estimated: 46 mL/min — ABNORMAL LOW (ref >60)
Glucose, Bld: 88 mg/dL (ref 70–99)
Potassium: 3.6 mmol/L (ref 3.5–5.1)
Sodium: 132 mmol/L — ABNORMAL LOW (ref 135–145)
Total Bilirubin: 1 mg/dL (ref 0.3–1.2)
Total Protein: 6.6 g/dL (ref 6.5–8.1)

## 2020-10-30 LAB — CBC WITH DIFFERENTIAL/PLATELET
Abs Immature Granulocytes: 0.04 10*3/uL (ref 0.00–0.07)
Basophils Absolute: 0 10*3/uL (ref 0.0–0.1)
Basophils Relative: 1 %
Eosinophils Absolute: 0.2 10*3/uL (ref 0.0–0.5)
Eosinophils Relative: 3 %
HCT: 29.2 % — ABNORMAL LOW (ref 39.0–52.0)
Hemoglobin: 9.3 g/dL — ABNORMAL LOW (ref 13.0–17.0)
Immature Granulocytes: 1 %
Lymphocytes Relative: 10 %
Lymphs Abs: 0.8 10*3/uL (ref 0.7–4.0)
MCH: 28.3 pg (ref 26.0–34.0)
MCHC: 31.8 g/dL (ref 30.0–36.0)
MCV: 88.8 fL (ref 80.0–100.0)
Monocytes Absolute: 0.8 10*3/uL (ref 0.1–1.0)
Monocytes Relative: 10 %
Neutro Abs: 6.3 10*3/uL (ref 1.7–7.7)
Neutrophils Relative %: 75 %
Platelets: 209 10*3/uL (ref 150–400)
RBC: 3.29 MIL/uL — ABNORMAL LOW (ref 4.22–5.81)
RDW: 16.6 % — ABNORMAL HIGH (ref 11.5–15.5)
WBC: 8.3 10*3/uL (ref 4.0–10.5)
nRBC: 0 % (ref 0.0–0.2)

## 2020-10-30 LAB — PROTIME-INR
INR: 2.4 — ABNORMAL HIGH (ref 0.8–1.2)
Prothrombin Time: 25.1 seconds — ABNORMAL HIGH (ref 11.4–15.2)

## 2020-10-30 MED ORDER — SODIUM CHLORIDE 0.9 % IV SOLN: 2.0000 g | INTRAVENOUS | 0 refills | Status: AC

## 2020-10-30 MED ORDER — CEFDINIR 300 MG PO CAPS: 300.0000 mg | ORAL_CAPSULE | Freq: Two times a day (BID) | ORAL | 0 refills | Status: AC

## 2020-10-30 MED ORDER — SODIUM CHLORIDE 0.9 % IV SOLN
2.0000 g | INTRAVENOUS | Status: DC
  Administered 2020-10-30: 2 g via INTRAVENOUS
  Filled 2020-10-30: qty 20

## 2020-10-30 NOTE — ED Provider Notes (Signed)
[facility] Emergency Department Provider Note  ____________________________________________  Time seen: Approximately 5:33 PM  I have reviewed the triage vital signs and the nursing notes.   HISTORY  Chief Complaint Wound Infection    HPI [name] is a 86 y.o. male with a history of aortic stenosis, diastolic heart failure, GERD, permanent atrial fibrillation, lymphedema who was sent to the ED from wound care clinic for possible infection of the left leg.  Patient reports having left leg pain for the past week with increased swelling.  Pain is constant, worse with movement and palpation, no alleviating factors, nonradiating.  Moderate intensity.  Denies fevers chills chest pain or shortness of breath.  Has been compliant with medications.  Lives at [facility] [place].  No recent trauma.    Past Medical History:  Diagnosis Date  . Anxiety 10/11  . Aortic stenosis    a. s/p mechcanical AVR, 2002; b. 10/2018 Echo: Triv AI, mean grad .  . Bradycardia    chronic, no symptoms 07/2010  . C. difficile colitis   . Carotid bruit    dopplers in past, no abnormalities  . Chronic diastolic CHF (congestive heart failure) (HCC)    a. Echo 03/2015: EF 60-65%, no RWMA, GR1DD, mild BAE, mild to mod MR, mod TR, PASP 65 mmHg; b. 01/2017 Echo: EF 55-60%, NRWMA, grade 1 diastolic dysfunction.  Normal functioning prosthetic aortic valve.  Mean gradient 50 mmHg.  Sev TR. PASP ; c. 10/2018 Echo: EF 55-60%, Triv AI, mod dil LA, mod-sev TR, PASP 35-40, mild to mod red RV fxn.  . Coronary artery disease    a. mild, cath, 08/2010; b. medically managed  . Decreased hearing    Right ear  . Depression   . Gastric ulcer   . GERD (gastroesophageal reflux disease)   . Hypertension    BP higher than usual 04/19/10; amlodipine increased by telephone  . Mod-Sev Tricuspid regurgitation    a. 10/2018 Echo: Mod-Sev TR, PASP 35-56mmHg.  Marland Kitchen RLS (restless legs syndrome)  08/23/2015  . S/P AVR    a. [employer]. mechanical 2002; b. echo 08/2010 EF 60%, trival AI, mild MR, AVR working well; c. on longterm warfarin tx  . SOB (shortness of breath) 10/11   08/2010,Episodes at 5 AM, eventually felt to be anxiety, after complete workup including catheterization, pt greatly improved with anxiety meds 11/11     Patient Active Problem List   Diagnosis Date Noted  . Fever 09/21/2020  . Acute on chronic respiratory failure with hypoxia (HCC) 09/21/2020  . History of lower GI bleeding 09/13/2020  . Symptomatic anemia 09/13/2020  . Malnutrition of moderate degree 08/08/2020  . Pressure injury of skin 08/02/2020  . Hemorrhagic shock (HCC) 07/30/2020  . Acute blood loss anemia 07/29/2020  . Lactic acidosis 07/29/2020  . AKI (acute kidney injury) (HCC) 07/11/2020  . Acute on chronic heart failure with preserved ejection fraction (HFpEF) (HCC)   . Permanent atrial fibrillation (HCC)   . Cellulitis 07/10/2020  . 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 Varicose veins of leg with swelling, right 02/08/2020  . 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 Greater trochanteric pain syndrome 11/02/2019  . Lower limb ulcer, ankle, left, limited to breakdown of skin (HCC) 11/02/2019  . H/O atrial flutter 07/15/2018  . Obstructive sleep apnea 10/23/2017  . Chronic diastolic CHF (congestive heart failure) (HCC) 09/24/2017  . Lymphedema 09/24/2017  . Chronic hyponatremia 08/12/2017  .  Encounter for anticoagulation discussion and counseling 02/24/2017  . Bilateral leg edema 09/25/2015  . RLS (restless legs syndrome) 08/23/2015  . Upper GI bleeding 07/31/2015  . C. difficile colitis 05/26/2015  . Blood loss anemia   . History of mechanical aortic valve replacement   . Anemia 04/02/2015  . GERD (gastroesophageal reflux disease)   . Bradycardia   . Depression   . Anxiety   . Hypertension   . Decreased hearing   . Coronary  artery disease   . Aortic stenosis   . S/P AVR   . Warfarin anticoagulation   . Carotid bruit      Past Surgical History:  Procedure Laterality Date  . CARDIAC CATHETERIZATION    . COLONOSCOPY WITH PROPOFOL N/A 08/03/2020   Procedure: COLONOSCOPY WITH PROPOFOL;  Surgeon: Toledo, Boykin Nearing, MD;  Location: [facility] ENDOSCOPY;  Service: Gastroenterology;  Laterality: N/A;  . ENTEROSCOPY N/A 09/17/2020   Procedure: ENTEROSCOPY;  Surgeon: Toney Reil, MD;  Location: [facility] ENDOSCOPY;  Service: Gastroenterology;  Laterality: N/A;  . ESOPHAGOGASTRODUODENOSCOPY N/A 04/05/2015   Procedure: ESOPHAGOGASTRODUODENOSCOPY (EGD);  Surgeon: Scot Jun, MD;  Location: [facility] ENDOSCOPY;  Service: Endoscopy;  Laterality: N/A;  . ESOPHAGOGASTRODUODENOSCOPY N/A 04/17/2015   Procedure: ESOPHAGOGASTRODUODENOSCOPY (EGD);  Surgeon: Scot Jun, MD;  Location: [facility] ENDOSCOPY;  Service: Endoscopy;  Laterality: N/A;  . ESOPHAGOGASTRODUODENOSCOPY N/A 08/02/2015   Procedure: ESOPHAGOGASTRODUODENOSCOPY (EGD);  Surgeon: Wallace Cullens, MD;  Location: [facility] ENDOSCOPY;  Service: Endoscopy;  Laterality: N/A;  . ESOPHAGOGASTRODUODENOSCOPY N/A 07/30/2020   Procedure: ESOPHAGOGASTRODUODENOSCOPY (EGD);  Surgeon: Toledo, Boykin Nearing, MD;  Location: [facility] ENDOSCOPY;  Service: Gastroenterology;  Laterality: N/A;  . GIVENS CAPSULE STUDY N/A 09/15/2020   Procedure: GIVENS CAPSULE STUDY;  Surgeon: Wyline Mood, MD;  Location: [facility] ENDOSCOPY;  Service: Gastroenterology;  Laterality: N/A;  . HERNIA REPAIR    . JOINT REPLACEMENT    . TOTAL HIP ARTHROPLASTY    . VALVE REPLACEMENT  1/02   Aortic; echo 3/09 valve working well; echo 10/11 working well; put on Coumadin     Prior to Admission medications   Medication Sig Start Date End Date Taking? Authorizing Provider  allopurinol (ZYLOPRIM) 300 MG tablet Take 300 mg by mouth daily.  05/16/20  Yes [provider]  ALPRAZolam (XANAX) 0.25 MG tablet Take 1 tablet (0.25 mg total) by  mouth daily as needed for anxiety. 09/26/20  Yes Lurene Shadow, MD  busPIRone (BUSPAR) 5 MG tablet Take 1 tablet (5 mg total) by mouth 2 (two) times daily. 08/10/20  Yes Lurene Shadow, MD  Cetirizine HCl 10 MG CAPS Take 10 mg by mouth daily.   Yes [provider]  DULoxetine (CYMBALTA) 30 MG capsule Take 1 capsule (30 mg total) by mouth daily. 07/15/20  Yes Meredeth Ide, MD  finasteride (PROSCAR) 5 MG tablet Take 5 mg by mouth daily.     Yes [provider]  metoprolol succinate (TOPROL-XL) 25 MG 24 hr tablet Take 0.5 tablets (12.5 mg total) by mouth daily. 03/06/20  Yes Gollan, Tollie Pizza, MD  pantoprazole (PROTONIX) 40 MG tablet Take 40 mg by mouth daily.   Yes [provider]  potassium chloride (KLOR-CON) 10 MEQ tablet TAKE 3 TABLETS (30 MEQ) BY MOUTH DAILY. TAKE AN ADDITIONAL 2TABS ON THE DAYS YOU TAKE METOLAZONE Patient taking differently: Take 30-50 mEq by mouth daily.  06/08/20  Yes Gollan, Tollie Pizza, MD  rOPINIRole (REQUIP) 2 MG tablet TAKE 1 TABLET BY MOUTH 4  TIMES DAILY Patient taking differently:  Take 2 mg by mouth in the morning, at noon, in the evening, and at bedtime. Take 1 tablet by mouth 4  times daily 09/14/18  Yes Butch Penny, NP  torsemide (DEMADEX) 20 MG tablet Take 2 tablets (40 mg total) by mouth 2 (two) times daily. 09/26/20  Yes Lurene Shadow, MD  traZODone (DESYREL) 50 MG tablet Take 50 mg by mouth at bedtime.   Yes [provider]  Acetaminophen 500 MG capsule Take 2 capsules by mouth every 8 (eight) hours as needed.    [provider]  cefdinir (OMNICEF) 300 MG capsule Take 1 capsule (300 mg total) by mouth 2 (two) times daily for 7 days. 11/03/20 11/10/20  Sharman Cheek, MD  cefTRIAXone 2 g in sodium chloride 0.9 % 100 mL Inject 2 g into the vein daily for 3 days. 10/31/20 11/03/20  Sharman Cheek, MD  metolazone (ZAROXOLYN) 2.5 MG tablet Take 1 tablet (2.5 mg total) by mouth 2 (two) times a week. 02/17/20 09/13/20   Antonieta Iba, MD  warfarin (COUMADIN) 1 MG tablet Take 0.5 mg by mouth daily. Take along with 3 mg tablet for total 3.5 mg once daily 10/21/20   [provider]  warfarin (COUMADIN) 3 MG tablet Take 3 mg by mouth daily. Take along with 0.5 mg for total 3.5 mg once daily 10/24/20   [provider]     Allergies Sulfa antibiotics and Levofloxacin   Family History  Problem Relation Age of Onset  . Hypertension Mother   . Diabetes type II Mother   . Heart disease Mother   . Heart attack Mother   . Hypertension Father   . Heart disease Father   . Stroke Father   . Stroke Brother   . Stroke Brother   . Hypertension Other     Social History Social History   Tobacco Use  . Smoking status: Former Smoker    Types: Cigarettes    Quit date: 1979    Years since quitting: 42.9  . Smokeless tobacco: Never Used  Vaping Use  . Vaping Use: Never used  Substance Use Topics  . Alcohol use: Yes    Alcohol/week: 3.0 standard drinks    Types: 3 Glasses of wine per week  . Drug use: No    Review of Systems  Constitutional:   No fever or chills.  ENT:   No sore throat. No rhinorrhea. Cardiovascular:   No chest pain or syncope. Respiratory:   No dyspnea or cough. Gastrointestinal:   Negative for abdominal pain, vomiting and diarrhea.  Musculoskeletal: Left leg pain and swelling as above All other systems reviewed and are negative except as documented above in ROS and HPI.  ____________________________________________   PHYSICAL EXAM:  VITAL SIGNS: ED Triage Vitals  Enc Vitals Group     BP 10/30/20 1320 (!) 148/68     Pulse Rate 10/30/20 1320 68     Resp 10/30/20 1320 20     Temp 10/30/20 1320 98.4 F (36.9 C)     Temp Source 10/30/20 1320 Oral     SpO2 10/30/20 1320 95 %     Weight 10/30/20 1321 195 lb (88.5 kg)     Height 10/30/20 1321 5\' 11"  (1.803 m)     Head Circumference --      Peak Flow --      Pain Score 10/30/20 1321 8     Pain Loc --       Pain Edu? --  Excl. in GC? --     Vital signs reviewed, nursing assessments reviewed.   Constitutional:   Alert and oriented. Non-toxic appearance. Eyes:   Conjunctivae are normal. EOMI. PERRL. ENT      Head:   Normocephalic and atraumatic.      Nose:   Wearing a mask.      Mouth/Throat:   Wearing a mask.      Neck:   No meningismus. Full ROM. Hematological/Lymphatic/Immunilogical:   No cervical lymphadenopathy. Cardiovascular:   RRR. Symmetric bilateral radial and DP pulses.  No murmurs. Cap refill less than 2 seconds. Respiratory:   Normal respiratory effort without tachypnea/retractions. Breath sounds are clear and equal bilaterally. No wheezes/rales/rhonchi. Gastrointestinal:   Soft and nontender. Non distended. There is no CVA tenderness.  No rebound, rigidity, or guarding. Musculoskeletal:   Normal range of motion in all extremities. No joint effusions.  There is 2+ pitting edema on left lower extremity, trace pitting edema on the right lower extremity.  Increased calf circumference on the left.  There is tenderness on the left as well.  There is 1 small superficial skin wound on the right shin, several small ones on the left lower leg with one on the medial calf appearing inflamed.  No crepitus or lymphangitis.  No fluctuance, no purulent drainage.  There is serous drainage from the left medial calf wound. Neurologic:   Normal speech and language.  Motor grossly intact. No acute focal neurologic deficits are appreciated.  Skin:    Skin is warm, dry and intact. No rash noted.  No petechiae, purpura, or bullae.  ____________________________________________    LABS (pertinent positives/negatives) (all labs ordered are listed, but only abnormal results are displayed) Labs Reviewed  COMPREHENSIVE METABOLIC PANEL - Abnormal; Notable for the following components:      Result Value   Sodium 132 (*)    Chloride 92 (*)    BUN 57 (*)    Creatinine, Ser 1.46 (*)    Calcium 8.7  (*)    Alkaline Phosphatase 131 (*)    GFR, Estimated 46 (*)    All other components within normal limits  CBC WITH DIFFERENTIAL/PLATELET - Abnormal; Notable for the following components:   RBC 3.29 (*)    Hemoglobin 9.3 (*)    HCT 29.2 (*)    RDW 16.6 (*)    All other components within normal limits  PROTIME-INR - Abnormal; Notable for the following components:   Prothrombin Time 25.1 (*)    INR 2.4 (*)    All other components within normal limits  LACTIC ACID, PLASMA  LACTIC ACID, PLASMA   ____________________________________________   EKG    ____________________________________________    RADIOLOGY  DG Tibia/Fibula Left  Result Date: 10/30/2020 CLINICAL DATA:  Left leg pain and swelling.  Chronic skin wound. EXAM: LEFT TIBIA AND FIBULA - 2 VIEW COMPARISON:  None. FINDINGS: There is no evidence of fracture or other focal bone lesions. Soft tissues are unremarkable. IMPRESSION: Negative. Electronically Signed   By: Lupita Raider M.D.   On: 10/30/2020 18:55   US Venous Img Lower Unilateral Left  Result Date: 10/30/2020 CLINICAL DATA:  Left lower extremity pain and edema EXAM: Left LOWER EXTREMITY VENOUS DOPPLER ULTRASOUND TECHNIQUE: Gray-scale sonography with compression, as well as color and duplex ultrasound, were performed to evaluate the deep venous system(s) from the level of the common femoral vein through the popliteal and proximal calf veins. COMPARISON:  06/14/2015 FINDINGS: VENOUS Normal compressibility of the common femoral,  superficial femoral, and popliteal veins, as well as the visualized calf veins. Visualized portions of profunda femoral vein and great saphenous vein unremarkable. No filling defects to suggest DVT on grayscale or color Doppler imaging. Doppler waveforms show normal direction of venous flow, normal respiratory plasticity and response to augmentation. Limited views of the contralateral common femoral vein are unremarkable. OTHER Mildly complex fluid  collection in the popliteal fossa measuring 3.6 by 6.4 x 1.3 cm. Limitations: none IMPRESSION: 1. Negative for acute left lower extremity DVT. 2. 6.4 cm mildly complicated popliteal fossa cyst. Electronically Signed   By: Jasmine PangKim  Fujinaga M.D.   On: 10/30/2020 18:29    ____________________________________________   PROCEDURES Procedures  ____________________________________________  DIFFERENTIAL DIAGNOSIS   DVT, cellulitis, worsened lymphedema  CLINICAL IMPRESSION / ASSESSMENT AND PLAN / ED COURSE  Medications ordered in the ED: Medications  cefTRIAXone (ROCEPHIN) 2 g in sodium chloride 0.9 % 100 mL IVPB (0 g Intravenous Stopped 10/30/20 1910)    Pertinent labs & imaging results that were available during my care of the patient were reviewed by me and considered in my medical decision making (see chart for details).  Tyshun Jennefer Bravornold Osier was evaluated in Emergency Department on 10/30/2020 for the symptoms described in the history of present illness. He was evaluated in the context of the global COVID-19 pandemic, which necessitated consideration that the patient might be at risk for infection with the SARS-CoV-2 virus that causes COVID-19. Institutional protocols and algorithms that pertain to the evaluation of patients at risk for COVID-19 are in a state of rapid change based on information released by regulatory bodies including the CDC and federal and state organizations. These policies and algorithms were followed during the patient's care in the ED.   Patient sent from wound care center with concern for cellulitis and worsening lower extremity wounds.  Will check x-ray and ultrasound.  Lab panel is reassuring, no leukocytosis, creatinine slightly elevated above previous baseline of 1.0 a month ago, lactate normal.  Vital signs normal, patient is not septic.  ----------------------------------------- 8:15 PM on 10/30/2020 -----------------------------------------  X-ray and ultrasound  of the left leg are unremarkable.  Discussed with the daughter at bedside who [facility] in SNF care and can receive IV medications under their care.  [facility], maintain IV, prescription written for an additional 3 days of ceftriaxone and then transition to cefdinir.      ____________________________________________   FINAL CLINICAL IMPRESSION(S) / ED DIAGNOSES    Final diagnoses:  Lymphedema  Wound infection     ED Discharge Orders         Ordered    cefdinir (OMNICEF) 300 MG capsule  2 times daily        10/30/20 2014    cefTRIAXone 2 g in sodium chloride 0.9 % 100 mL  Every 24 hours        10/30/20 2014          Portions of this note were generated with dragon dictation software. Dictation errors may occur despite best attempts at proofreading.   Sharman CheekStafford, Luba Matzen, MD 10/30/20 2016

## 2020-10-30 NOTE — Progress Notes (Addendum)
BURL, TAUZIN ([id number]) Visit Report for 10/30/2020 Chief Complaint Document Details Patient Name: ZERRICK, HANSSEN A. Date of Service: 10/30/2020 11:00 AM Medical Record Number: [id number] Patient Account Number: [id number] Date of Birth/Sex: 04-Jul-1931 (86 y.o. M) Treating RN: Huel Coventry Primary Care Provider: [name] Other Clinician: Referring Provider: [name] Treating Provider/Extender: Rowan Blase in Treatment: 1 Information Obtained from: Patient Chief Complaint Left Medical LE Ulcers Electronic Signature(s) Signed: 10/30/2020 11:16:43 AM By: Lenda Kelp PA-C Entered By: Lenda Kelp on 10/30/2020 11:16:43 [name] ([id number]) -------------------------------------------------------------------------------- HPI Details Patient Name: [name] Date of Service: 10/30/2020 11:00 AM Medical Record Number: [id number] Patient Account Number: [id number] Date of Birth/Sex: 12-15-1930 (86 y.o. M) Treating RN: Huel Coventry Primary Care Provider: [name] Other Clinician: Referring Provider: [name] Treating Provider/Extender: Rowan Blase in Treatment: 1 History of Present Illness HPI Description: 04/21/2019 ADMISSION This is an independent 86 year old man who lives in the independent part of [address] of [place]. He has a history of chronic lower extremity edema and wears compression stockings. He states about a month ago he was taking off the one on the right and pulled some skin off accidentally. He has had 2 small wounds on the right anterior and right medial lower extremity. I note looking through [facility] health link that he had multiple venous ultrasounds in 2015 and 16. These were DVT rule outs. He did have a Baker's cyst on the left. He has not not had a previous wound history although he did have a history of cellulitis in his legs. Past medical history; includes aortic stenosis status post mechanical AVR in 2007 on  chronic Coumadin, lower extremity edema with a history of cellulitis, history of MRSA. ABIs in our clinic were 1.1 on the right 6/3; patient with predominantly venous insufficiency ulcers in the right lower leg probably some degree of lymphedema. He had to wounds last week. We put him in 3 layer compression. The nurses at [facility] are changing the dressing. The area laterally is healed but he still has a small very painful area on the anterior tibial area. He is on Coumadin because of mechanical aortic valve. 6/10; venous insufficiency ulcers on the right lower leg. He has some degree of lymphedema. We have been using 3 layer compression with silver collagen small wound. Dimensions are improved. I gave him doxycycline last week which he tolerated because of surrounding erythema. This is improved also 6/17; arrives in clinic today with portable silver collagen dressing tightly adherent to the wound. Also felt that the wrap was too tight 3 layer being changed at the [facility] 6/24; patient's wound is small still adherent debris over the surface however even under illumination it was hard to see what was still open here. We have been using silver collagen 7/1; the patient arrives in the clinic today and the area on the right lower leg is healed. He has severe chronic venous insufficiency. He has 20/30 compression stockings. Readmission: 06/22/2020 on evaluation today patient presents for reevaluation here in our clinic although it has been a little over a year since we last saw him. He does have a history of lymphedema, venous insufficiency, anticoagulant therapy, he is on a pacemaker which is the reason for the anticoagulants, hypertension, and congestive heart failure. With that being said unfortunately he has been dealing with a wound of the left and right legs which has been giving him some trouble since arrival first 2021. That is in  regard to the left leg ulcer. In regard  to the right leg ulcer this is just a very small area that really I think will seal up quite nicely is more of the lymphedema opening than anything else. With that being said unfortunately the left leg is quite significant. We actually have noted that the patient's been dealing with this for quite some time and I think that there is a chance we may want to consider biopsy. With that being said he is on Coumadin therefore we were not able to perform a biopsy at this point. I think that something however in the future that may be warranted we may just have to take him off of the current Coumadin regimen in order to do this. We discussed doing it today but I am more concerned about the fact that he could have issues with uncontrolled bleeding and in that case we may have to send him to the ER. His daughter was not wanting to take that chance which I can completely understand. Nonetheless so far they have been using antibiotic ointment over the area I am just putting a protective dressing I do believe the patient is that he needs some type of compression. 07/07/2020 on evaluation today patient appears to be doing poorly in regard to his bilateral lower extremities today. He actually went to urgent care last night due to having pain in his legs due to the wraps. It appears they got somewhat tight on him because of the fact that he is having such significant swelling. He appears to be fluid overloaded I do not see any signs of infection actively at this point which is good news but unfortunately I think that this is something that may need to be addressed even by cardiology more so than just with a different or alternate type of wrap. I will contact his cardiologist, Dr. Mariah Milling, as well. 07/10/2020 upon evaluation today patient unfortunately appears to be doing worse even than when I saw him on Friday from the standpoint of where his legs stand. The main issue is that he has increased erythema although there  is still not warm to touch I am concerned about infection and about this getting significantly worse. Again my initial concern was more fluid overload and I discussed this with his cardiology office on Friday. With that being said I feel like this has gotten worse his daughter is in agreement and I think that the best option would be to send him to the hospital at this point. READMISSION 09/06/2020 Is a patient who has had 2 stays in this clinic firstly in 2020 and more recently from 06/02/2020 through 07/10/2020. He was cared for by Allen Derry. When he was last here he had bilateral lower extremity ulcers in the setting of chronic venous insufficiency with lymphedema. When he we last saw him he had weeping edema fluido Cellulitis and he was sent to the hospital. He was admitted initially from 06/30/2020 through 07/14/2020 with cellulitis plus stasis dermatitis. Treated empirically with vancomycin and had increases in his torsemide. He was then sent to the skilled level of [facility] [place] but readmitted to hospital from 07/29/2020 through 08/10/2020 with blood loss anemia hemoglobin of 4 and OB positive stools. During this hospitalization he was noted to have a sacral wound at stage II. He was sent to [facility] [place] again in the skilled level. He had compression on his legs with Coban. He is now back at his independent living setting. He  has not been wearing compression and his daughter feels there is already increasing swelling. To have stockings at home not exactly sure at what strength. KEVON, TENCH A. ([id number]) Past medical history includes lymphedema, chronic venous insufficiency, pacemaker, congestive heart failure, hypertension, aortic valve stenosis status post mechanical valve replacement in 2002 on chronic Coumadin, chronic kidney disease stage IIIb, atrial fibrillation, peripheral neuropathy, large hiatal hernia. We did not test his ABIs in the clinic  today. Readmission: 10/17/2020 on evaluation today patient appears to be doing somewhat poorly in regard to his left leg and the fact that he does have an open wound at this point. He has not had the compression socks placed at the facility they have not been putting them on at all unfortunately. He does not have on the right leg currently and apparently there is significant put him on the left leg with the wound which I understand is that can be somewhat tight obviously. Nonetheless I do believe the right leg needs to have a compression sock and I do believe on the left side right now we can do something a little better in order to try to get the wound healed at this point. The patient is on torsemide and currently allopurinol for gout. He also has daily weights due to congestive heart failure I am assuming in order to monitor his fluid status overall. He is currently a DNR at the facility. 10/30/2020 upon evaluation today patient appears to be doing somewhat poorly at this time in regard to his bilateral lower extremities. His left leg which is the only place where he had a wound last week when I saw him is actually doing worse with new wound openings. The right leg also has a small wound as well. However the most concerning thing is that last week he did not have any signs of infection or erythema there is no warmth to touch over the leg. This week the entire leg is more sore he also has erythema extending from the ankle to just below the knee with the area being erythematous and warm to touch which I believe represents cellulitis at minimum. I am concerned about how this is spread so quickly and the fact that he is having increased pain and also not feeling as well. All in all I discussed with the patient that unfortunately I feel like he may be best served going to the ER for further evaluation of this issue currently to try to see about the potential for IV antibiotics and to have  appropriate lab work performed. His daughter is present during the visit today and she was included in the decision making at this point. Electronic Signature(s) Signed: 10/30/2020 11:30:53 AM By: Lenda Kelp PA-C Entered By: Lenda Kelp on 10/30/2020 11:30:53 Joellyn Quails ([id number]) -------------------------------------------------------------------------------- Physical Exam Details Patient Name: [name] Date of Service: 10/30/2020 11:00 AM Medical Record Number: [id number] Patient Account Number: [id number] Date of Birth/Sex: 08/19/1931 (86 y.o. M) Treating RN: Huel Coventry Primary Care Provider: [name] Other Clinician: Referring Provider: [name] Treating Provider/Extender: Allen Derry Weeks in Treatment: 1 Constitutional Well-nourished and well-hydrated in no acute distress. Respiratory normal breathing without difficulty. Psychiatric this patient is able to make decisions and demonstrates good insight into disease process. Alert and Oriented x 3. pleasant and cooperative. Notes Upon inspection today patient's leg appears to not be doing so well as far as the erythema and warmth is concerned on the left especially. The right also shows  evidence of this but not quite as significant. Nonetheless I believe the patient does need to go to the ER for further evaluation and treatment ASAP. Electronic Signature(s) Signed: 10/30/2020 11:32:13 AM By: Lenda KelpStone III, Jaksen Fiorella PA-C Entered By: Lenda KelpStone III, Wafa Martes on 10/30/2020 11:32:13 [name], Montgomery A. ([id number]) -------------------------------------------------------------------------------- Physician Orders Details Patient Name: [name], Jaksen A. Date of Service: 10/30/2020 11:00 AM Medical Record Number: [id number] Patient Account Number: [id number] Date of Birth/Sex: Aug 09, 1931 (86 y.o. M) Treating RN: Huel CoventryWoody, [name] Primary Care Provider: Marcelino DusterJohnston, John Other Clinician: Referring Provider: Marcelino DusterJohnston, John Treating  Provider/Extender: Rowan BlaseStone, Shrita Thien Weeks in Treatment: 1 Verbal / Phone Orders: No Diagnosis Coding ICD-10 Coding Code Description I89.0 Lymphedema, not elsewhere classified I87.2 Venous insufficiency (chronic) (peripheral) L97.822 Non-pressure chronic ulcer of other part of left lower leg with fat layer exposed L97.812 Non-pressure chronic ulcer of other part of right lower leg with fat layer exposed Z79.01 Long term (current) use of anticoagulants Z95.0 Presence of cardiac pacemaker I10 Essential (primary) hypertension I50.42 Chronic combined systolic (congestive) and diastolic (congestive) heart failure I25.10 Atherosclerotic heart disease of native coronary artery without angina pectoris Wound Cleansing Wound #8 Left,Medial Lower Leg o May Shower, gently pat wound dry prior to applying new dressing. Anesthetic (add to Medication List) Wound #8 Left,Medial Lower Leg o Topical Lidocaine 4% cream applied to wound bed prior to debridement (In Clinic Only). Skin Barriers/Peri-Wound Care Wound #8 Left,Medial Lower Leg o Barrier cream Primary Wound Dressing Wound #8 Left,Medial Lower Leg o Silver Alginate Secondary Dressing Wound #8 Left,Medial Lower Leg o ABD and Kerlix/Conform Dressing Change Frequency Wound #8 Left,Medial Lower Leg o Change Dressing Monday, Wednesday, Friday Follow-up Appointments Wound #8 Left,Medial Lower Leg o Return Appointment in 2 weeks. Edema Control Wound #8 Left,Medial Lower Leg o Patient to wear own compression stockings o Other: - Tubi-F in the clinic Additional Orders / Instructions [name], Leeroy A. ([id number]) Wound #8 Left,Medial Lower Leg o Increase protein intake. Notes Patient sent to the Emergency Department to be evaluated for IV antibiotics. Electronic Signature(s) Signed: 10/30/2020 5:04:06 PM By: Elliot GurneyWoody, BSN, RN, CWS, [name] RN, BSN Signed: 10/31/2020 5:22:05 PM By: Lenda KelpStone III, Oliviya Gilkison PA-C Entered By: Elliot GurneyWoody, BSN, RN, CWS,  [name] on 10/30/2020 17:04:06 [name], Whitley AMarland Kitchen. ([id number]) -------------------------------------------------------------------------------- Problem List Details Patient Name: [name], Antwone A. Date of Service: 10/30/2020 11:00 AM Medical Record Number: [id number] Patient Account Number: [id number] Date of Birth/Sex: Aug 09, 1931 (86 y.o. M) Treating RN: Huel CoventryWoody, [name] Primary Care Provider: Marcelino DusterJohnston, John Other Clinician: Referring Provider: Marcelino DusterJohnston, John Treating Provider/Extender: Rowan BlaseStone, Maleiah Dula Weeks in Treatment: 1 Active Problems ICD-10 Encounter Code Description Active Date MDM Diagnosis I89.0 Lymphedema, not elsewhere classified 10/17/2020 No Yes I87.2 Venous insufficiency (chronic) (peripheral) 10/17/2020 No Yes L97.822 Non-pressure chronic ulcer of other part of left lower leg with fat layer 10/17/2020 No Yes exposed L97.812 Non-pressure chronic ulcer of other part of right lower leg with fat layer 10/17/2020 No Yes exposed Z79.01 Long term (current) use of anticoagulants 10/17/2020 No Yes Z95.0 Presence of cardiac pacemaker 10/17/2020 No Yes I10 Essential (primary) hypertension 10/17/2020 No Yes I50.42 Chronic combined systolic (congestive) and diastolic (congestive) heart 10/17/2020 No Yes failure I25.10 Atherosclerotic heart disease of native coronary artery without angina 10/17/2020 No Yes pectoris Inactive Problems Resolved Problems Electronic Signature(s) Signed: 10/30/2020 11:16:36 AM By: Lenda KelpStone III, Janith Nielson PA-C Entered By: Lenda KelpStone III, Ever Gustafson on 10/30/2020 11:16:35 [name], Lot A. ([id number]) -------------------------------------------------------------------------------- Progress Note Details Patient Name: [name], Gonsalo A. Date of Service: 10/30/2020 11:00 AM Medical Record Number: [id number] Patient Account Number:  [id number] Date of Birth/Sex: 1931-01-24 (86 y.o. M) Treating RN: Huel Coventry Primary Care Provider: [name] Other Clinician: Referring Provider:  [name] Treating Provider/Extender: Rowan Blase in Treatment: 1 Subjective Chief Complaint Information obtained from Patient Left Medical LE Ulcers History of Present Illness (HPI) 04/21/2019 ADMISSION This is an independent 86 year old man who lives in the independent part of [address] of [place]. He has a history of chronic lower extremity edema and wears compression stockings. He states about a month ago he was taking off the one on the right and pulled some skin off accidentally. He has had 2 small wounds on the right anterior and right medial lower extremity. I note looking through [facility] health link that he had multiple venous ultrasounds in 2015 and 16. These were DVT rule outs. He did have a Baker's cyst on the left. He has not not had a previous wound history although he did have a history of cellulitis in his legs. Past medical history; includes aortic stenosis status post mechanical AVR in 2007 on chronic Coumadin, lower extremity edema with a history of cellulitis, history of MRSA. ABIs in our clinic were 1.1 on the right 6/3; patient with predominantly venous insufficiency ulcers in the right lower leg probably some degree of lymphedema. He had to wounds last week. We put him in 3 layer compression. The nurses at [facility] are changing the dressing. The area laterally is healed but he still has a small very painful area on the anterior tibial area. He is on Coumadin because of mechanical aortic valve. 6/10; venous insufficiency ulcers on the right lower leg. He has some degree of lymphedema. We have been using 3 layer compression with silver collagen small wound. Dimensions are improved. I gave him doxycycline last week which he tolerated because of surrounding erythema. This is improved also 6/17; arrives in clinic today with portable silver collagen dressing tightly adherent to the wound. Also felt that the wrap was too tight 3 layer being changed at the  [facility] 6/24; patient's wound is small still adherent debris over the surface however even under illumination it was hard to see what was still open here. We have been using silver collagen 7/1; the patient arrives in the clinic today and the area on the right lower leg is healed. He has severe chronic venous insufficiency. He has 20/30 compression stockings. Readmission: 06/22/2020 on evaluation today patient presents for reevaluation here in our clinic although it has been a little over a year since we last saw him. He does have a history of lymphedema, venous insufficiency, anticoagulant therapy, he is on a pacemaker which is the reason for the anticoagulants, hypertension, and congestive heart failure. With that being said unfortunately he has been dealing with a wound of the left and right legs which has been giving him some trouble since arrival first 2021. That is in regard to the left leg ulcer. In regard to the right leg ulcer this is just a very small area that really I think will seal up quite nicely is more of the lymphedema opening than anything else. With that being said unfortunately the left leg is quite significant. We actually have noted that the patient's been dealing with this for quite some time and I think that there is a chance we may want to consider biopsy. With that being said he is on Coumadin therefore we were not able to perform a biopsy at this point. I think that something however in  the future that may be warranted we may just have to take him off of the current Coumadin regimen in order to do this. We discussed doing it today but I am more concerned about the fact that he could have issues with uncontrolled bleeding and in that case we may have to send him to the ER. His daughter was not wanting to take that chance which I can completely understand. Nonetheless so far they have been using antibiotic ointment over the area I am just putting a protective  dressing I do believe the patient is that he needs some type of compression. 07/07/2020 on evaluation today patient appears to be doing poorly in regard to his bilateral lower extremities today. He actually went to urgent care last night due to having pain in his legs due to the wraps. It appears they got somewhat tight on him because of the fact that he is having such significant swelling. He appears to be fluid overloaded I do not see any signs of infection actively at this point which is good news but unfortunately I think that this is something that may need to be addressed even by cardiology more so than just with a different or alternate type of wrap. I will contact his cardiologist, Dr. Mariah Milling, as well. 07/10/2020 upon evaluation today patient unfortunately appears to be doing worse even than when I saw him on Friday from the standpoint of where his legs stand. The main issue is that he has increased erythema although there is still not warm to touch I am concerned about infection and about this getting significantly worse. Again my initial concern was more fluid overload and I discussed this with his cardiology office on Friday. With that being said I feel like this has gotten worse his daughter is in agreement and I think that the best option would be to send him to the hospital at this point. READMISSION 09/06/2020 Is a patient who has had 2 stays in this clinic firstly in 2020 and more recently from 06/02/2020 through 07/10/2020. He was cared for by Allen Derry. When he was last here he had bilateral lower extremity ulcers in the setting of chronic venous insufficiency with lymphedema. When he we last saw him he had weeping edema fluido Cellulitis and he was sent to the hospital. He was admitted initially from 06/30/2020 through 07/14/2020 [facility], Keymarion A. ([id number]) with cellulitis plus stasis dermatitis. Treated empirically with vancomycin and had increases in his torsemide. He was then sent  to the skilled level of [place] [facility] [place] but readmitted to hospital from 07/29/2020 through 08/10/2020 with blood loss anemia hemoglobin of 4 and OB positive stools. During this hospitalization he was noted to have a sacral wound at stage II. He was sent to [facility] [place] again in the skilled level. He had compression on his legs with Coban. He is now back at his independent living setting. He has not been wearing compression and his daughter feels there is already increasing swelling. To have stockings at home not exactly sure at what strength. Past medical history includes lymphedema, chronic venous insufficiency, pacemaker, congestive heart failure, hypertension, aortic valve stenosis status post mechanical valve replacement in 2002 on chronic Coumadin, chronic kidney disease stage IIIb, atrial fibrillation, peripheral neuropathy, large hiatal hernia. We did not test his ABIs in the clinic today. Readmission: 10/17/2020 on evaluation today patient appears to be doing somewhat poorly in regard to his left leg and the fact that he does have an open  wound at this point. He has not had the compression socks placed at the facility they have not been putting them on at all unfortunately. He does not have on the right leg currently and apparently there is significant put him on the left leg with the wound which I understand is that can be somewhat tight obviously. Nonetheless I do believe the right leg needs to have a compression sock and I do believe on the left side right now we can do something a little better in order to try to get the wound healed at this point. The patient is on torsemide and currently allopurinol for gout. He also has daily weights due to congestive heart failure I am assuming in order to monitor his fluid status overall. He is currently a DNR at the facility. 10/30/2020 upon evaluation today patient appears to be doing somewhat poorly at this time in regard to his  bilateral lower extremities. His left leg which is the only place where he had a wound last week when I saw him is actually doing worse with new wound openings. The right leg also has a small wound as well. However the most concerning thing is that last week he did not have any signs of infection or erythema there is no warmth to touch over the leg. This week the entire leg is more sore he also has erythema extending from the ankle to just below the knee with the area being erythematous and warm to touch which I believe represents cellulitis at minimum. I am concerned about how this is spread so quickly and the fact that he is having increased pain and also not feeling as well. All in all I discussed with the patient that unfortunately I feel like he may be best served going to the ER for further evaluation of this issue currently to try to see about the potential for IV antibiotics and to have appropriate lab work performed. His daughter is present during the visit today and she was included in the decision making at this point. Objective Constitutional Well-nourished and well-hydrated in no acute distress. Vitals Time Taken: 11:11 AM, Temperature: 97.6 F, Pulse: 72 bpm, Respiratory Rate: 20 breaths/min, Blood Pressure: 136/83 mmHg. Respiratory normal breathing without difficulty. Psychiatric this patient is able to make decisions and demonstrates good insight into disease process. Alert and Oriented x 3. pleasant and cooperative. General Notes: Upon inspection today patient's leg appears to not be doing so well as far as the erythema and warmth is concerned on the left especially. The right also shows evidence of this but not quite as significant. Nonetheless I believe the patient does need to go to the ER for further evaluation and treatment ASAP. Integumentary (Hair, Skin) Wound #8 status is Open. Original cause of wound was Gradually Appeared. The wound is located on the Left,Medial Lower  Leg. The wound measures 2.1cm length x 1.2cm width x 0.1cm depth; 1.979cm^2 area and 0.198cm^3 volume. There is Fat Layer (Subcutaneous Tissue) exposed. There is a large amount of serous drainage noted. The wound margin is flat and intact. There is no granulation within the wound bed. There is a large (67-100%) amount of necrotic tissue within the wound bed including Adherent Slough. Assessment Active Problems ICD-10 Lymphedema, not elsewhere classified Venous insufficiency (chronic) (peripheral) Non-pressure chronic ulcer of other part of left lower leg with fat layer exposed Non-pressure chronic ulcer of other part of right lower leg with fat layer exposed Long term (current) use of anticoagulants  Zeringue, Akoni A. 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 My concern today is that the patient has erythema and warmth over the entirety of the leg from just around the ankle all the way to just below the knee and this was not present prior to my evaluation today. I had seen him last week and that was not the case. With that being said coupled with the fact that he is having increased discomfort at this time and overall in general is not feeling well I think the best option would be to have the patient seen in the ER for evaluation and treatment and possible initiation of IV antibiotic therapy to be transitioned to oral at some point deemed appropriate by the hospital. 2. With regard to dressings today we will get a put ABD pads and Tubigrip on and just to hold everything in place until he gets there for further evaluation and treatment. Obviously I think however that this is just temporary and obviously he really does need something done for his legs from the standpoint of infection. The  cellulitis is quite extensive compared to last week when he had no evidence of anything such. I did contact the triage nurse Judeth Cornfield at the hospital as well and she was made aware that the patient is coming for evaluation in the ER. We will see the patient back for follow-up visit after his ER evaluation. Electronic Signature(s) Signed: 10/30/2020 1:54:38 PM By: Lenda Kelp PA-C Previous Signature: 10/30/2020 11:35:16 AM Version By: Lenda Kelp PA-C Previous Signature: 10/30/2020 11:33:50 AM Version By: Lenda Kelp PA-C Entered By: Lenda Kelp on 10/30/2020 13:54:38 [name], Henrique A. ([id number]) -------------------------------------------------------------------------------- SuperBill Details Patient Name: [name] Date of Service: 10/30/2020 Medical Record Number: [id number] Patient Account Number: [id number] Date of Birth/Sex: February 18, 1931 (86 y.o. M) Treating RN: Huel Coventry Primary Care Provider: [name] Other Clinician: Referring Provider: [name] Treating Provider/Extender: Rowan Blase in Treatment: 1 Diagnosis Coding ICD-10 Codes Code Description I89.0 Lymphedema, not elsewhere classified I87.2 Venous insufficiency (chronic) (peripheral) L97.822 Non-pressure chronic ulcer of other part of left lower leg with fat layer exposed L97.812 Non-pressure chronic ulcer of other part of right lower leg with fat layer exposed Z79.01 Long term (current) use of anticoagulants Z95.0 Presence of cardiac pacemaker I10 Essential (primary) hypertension I50.42 Chronic combined systolic (congestive) and diastolic (congestive) heart failure I25.10 Atherosclerotic heart disease of native coronary artery without angina pectoris Facility Procedures CPT4 Code: 50539767 Description: 99213 - WOUND CARE VISIT-LEV 3 EST PT Modifier: Quantity: 1 Physician Procedures CPT4 Code: 3419379 Description: 99214 - WC PHYS LEVEL 4 - EST PT Modifier: Quantity: 1 CPT4  Code: Description: ICD-10 Diagnosis Description I89.0 Lymphedema, not elsewhere classified I87.2 Venous insufficiency (chronic) (peripheral) L97.822 Non-pressure chronic ulcer of other part of left lower leg with fat lay L97.812 Non-pressure chronic ulcer of  other part of right lower leg with fat la Modifier: er exposed yer exposed Quantity: Electronic Signature(s) Signed: 10/30/2020 5:05:30 PM By: Elliot Gurney, BSN, RN, CWS, [name] RN, BSN Signed: 10/31/2020 5:22:05 PM By: Lenda Kelp PA-C Previous Signature: 10/30/2020 11:35:45 AM Version By: Lenda Kelp PA-C Entered By: Elliot Gurney, BSN, RN, CWS, [name] on 10/30/2020 17:05:30

## 2020-10-30 NOTE — ED Notes (Signed)
No monitor in room to update vitals.  Charge RN aware.

## 2020-10-30 NOTE — ED Triage Notes (Addendum)
Pt via POV from [address] of [place]. Pt from the wound care clinic and per daughter pt was sent over here for potential infection in the wounds in his L leg. Daughter states that wound nurses states that his legs are hot to the touch. Pt c/o L leg pain. Pt is A&Ox4 and NAD. On assessment, L leg is warm to the touch and weepy.

## 2020-10-30 NOTE — ED Notes (Signed)
Pt being sent back to facility with 20G IV in RAC in place. MD request for 2g IV ceftriaxone for 3 days. Spoke with [facility] facility to ensure capability to carry out MD orders. Per facility, they are able to administer IV medication and fill paper prescription within the facility. ED administration aware and will contact facility in 3 days time to ensure prompt IV removal following therapy completion.

## 2020-10-30 NOTE — Discharge Instructions (Signed)
We gave you a dose of ceftriaxone in the ED today for an infected wound on the left leg.  You should receive a daily dose of ceftriaxone for the next 3 days, and then transition to oral cefdinir to complete the antibiotic course.

## 2020-10-31 ENCOUNTER — Ambulatory Visit (INDEPENDENT_AMBULATORY_CARE_PROVIDER_SITE_OTHER): Payer: Medicare Other | Admitting: Vascular Surgery

## 2020-10-31 NOTE — Progress Notes (Signed)
ALTAN, KRAAI ([id number]) Visit Report for 10/30/2020 Arrival Information Details Patient Name: [name], [name] Date of Service: 10/30/2020 11:00 AM Medical Record Number: [id number] Patient Account Number: [id number] Date of Birth/Sex: 07-Sep-1931 (86 y.o. M) Treating RN: Huel Coventry Primary Care [name]: [name] Other Clinician: Referring [name]: [name] Treating [name]/Extender: Rowan Blase in Treatment: 1 Visit Information History Since Last Visit Added or deleted any medications: No Patient Arrived: Wheel Chair Has Dressing in Place as Prescribed: Yes Arrival Time: 11:10 Pain Present Now: Yes Accompanied By: daughter, [name] Transfer Assistance: None Patient Identification Verified: Yes Secondary Verification Process Completed: Yes Electronic Signature(s) Signed: 10/31/2020 5:20:48 PM By: Elliot Gurney, BSN, RN, CWS, [name] RN, BSN Entered By: Elliot Gurney, BSN, RN, CWS, [name] on 10/30/2020 11:11:27 [name] ([id number]) -------------------------------------------------------------------------------- Clinic Level of Care Assessment Details Patient Name: [name] A. Date of Service: 10/30/2020 11:00 AM Medical Record Number: [id number] Patient Account Number: [id number] Date of Birth/Sex: May 21, 1931 (86 y.o. M) Treating RN: Huel Coventry Primary Care Kingsly Kloepfer: [name] Other Clinician: Referring [name]: [name] Treating Maymunah Stegemann/Extender: Rowan Blase in Treatment: 1 Clinic Level of Care Assessment Items TOOL 4 Quantity Score []  - Use when only an EandM is performed on FOLLOW-UP visit 0 ASSESSMENTS - Nursing Assessment / Reassessment X - Reassessment of Co-morbidities (includes updates in patient status) 1 10 X- 1 5 Reassessment of Adherence to Treatment Plan ASSESSMENTS - Wound and Skin Assessment / Reassessment X - Simple Wound Assessment / Reassessment - one wound 1 5 []  - 0 Complex Wound Assessment / Reassessment - multiple  wounds []  - 0 Dermatologic / Skin Assessment (not related to wound area) ASSESSMENTS - Focused Assessment []  - Circumferential Edema Measurements - multi extremities 0 []  - 0 Nutritional Assessment / Counseling / Intervention []  - 0 Lower Extremity Assessment (monofilament, tuning fork, pulses) []  - 0 Peripheral Arterial Disease Assessment (using hand held doppler) ASSESSMENTS - Ostomy and/or Continence Assessment and Care []  - Incontinence Assessment and Management 0 []  - 0 Ostomy Care Assessment and Management (repouching, etc.) PROCESS - Coordination of Care X - Simple Patient / Family Education for ongoing care 1 15 []  - 0 Complex (extensive) Patient / Family Education for ongoing care []  - 0 Staff obtains , Records, Test Results / Process Orders []  - 0 Staff telephones HHA, Nursing Homes / Clarify orders / etc []  - 0 Routine Transfer to another Facility (non-emergent condition) X- 1 10 Routine Hospital Admission (non-emergent condition) []  - 0 New Admissions / / Ordering NPWT, Apligraf, etc. []  - 0 Emergency Hospital Admission (emergent condition) X- 1 10 Simple Discharge Coordination []  - 0 Complex (extensive) Discharge Coordination PROCESS - Special Needs []  - Pediatric / Minor Patient Management 0 []  - 0 Isolation Patient Management []  - 0 Hearing / Language / Visual special needs []  - 0 Assessment of Community assistance (transportation, D/C planning, etc.) []  - 0 Additional assistance / Altered mentation []  - 0 Support Surface(s) Assessment (bed, cushion, seat, etc.) INTERVENTIONS - Wound Cleansing / Measurement Millspaugh, Deveron A. ( ) X- 1 5 Simple Wound Cleansing - one wound []  - 0 Complex Wound Cleansing - multiple wounds X- 1 5 Wound Imaging (photographs - any number of wounds) []  - 0 Wound Tracing (instead of photographs) X- 1 5 Simple Wound Measurement - one wound []  - 0 Complex Wound Measurement -  multiple wounds INTERVENTIONS - Wound Dressings []  - Small Wound Dressing one or multiple wounds 0 []  - 0 Medium Wound  Dressing one or multiple wounds []  - 0 Large Wound Dressing one or multiple wounds []  - 0 Application of Medications - topical []  - 0 Application of Medications - injection INTERVENTIONS - Miscellaneous []  - External ear exam 0 []  - 0 Specimen Collection (cultures, biopsies, blood, body fluids, etc.) []  - 0 Specimen(s) / Culture(s) sent or taken to Lab for analysis []  - 0 Patient Transfer (multiple staff / / Similar devices) []  - 0 Simple Staple / Suture removal (25 or less) []  - 0 Complex Staple / Suture removal (26 or more) []  - 0 Hypo / Hyperglycemic Management (close monitor of Blood Glucose) []  - 0 Ankle / Brachial Index (ABI) - do not check if billed separately X- 1 5 Vital Signs Has the patient been seen at the hospital within the last three years: Yes Total Score: 75 Level Of Care: New/Established - Level 2 Electronic Signature(s) Signed: 10/31/2020 5:20:48 PM By: , BSN, RN, CWS, [name] RN, BSN Entered By: , BSN, RN, CWS, [name] on 10/30/2020 17:05:15 ( ) -------------------------------------------------------------------------------- Encounter Discharge Information Details Patient Name: Nurse, [name] Date of Service: 10/30/2020 11:00 AM Medical Record Number: Patient Account Number: Date of Birth/Sex: Aug 04, 1931 (86 y.o. M) Treating RN: Elliot Gurney Primary Care Kolby Myung: Elliot Gurney Other Clinician: Referring Earl Zellmer: 14/04/2020 Treating Ercel Normoyle/Extender: [name] in Treatment: 1 Encounter Discharge Information Items Discharge Condition: Stable Ambulatory Status: Wheelchair Discharge Destination: Home Transportation: Private Auto Accompanied By: daughter Schedule Follow-up Appointment: Yes Clinical Summary of Care: Electronic Signature(s) Signed:  10/30/2020 5:06:58 PM By: [name], BSN, RN, CWS, [name] RN, BSN Entered By: 14/04/2020, BSN, RN, CWS, [name] on 10/30/2020 17:06:58 [id number] (09/14/1931) -------------------------------------------------------------------------------- Lower Extremity Assessment Details Patient Name: [name], [name] Date of Service: 10/30/2020 11:00 AM Medical Record Number: [name] Patient Account Number: [name] Date of Birth/Sex: 09-Nov-1931 (86 y.o. M) Treating RN: Elliot Gurney Primary Care Josh Nicolosi: Elliot Gurney Other Clinician: Referring Tarin Johndrow: 14/04/2020 Treating Tyara Dassow/Extender: [name] Weeks in Treatment: 1 Edema Assessment Assessed: [Left: No] [Right: No] [Left: Edema] [Right: :] Calf Left: Right: Point of Measurement: 33 cm From Medial Instep 38.5 cm Ankle Left: Right: Point of Measurement: 10 cm From Medial Instep 23 cm Vascular Assessment Pulses: Dorsalis Pedis Palpable: [Left:Yes] [Right:Yes] Electronic Signature(s) Signed: 10/31/2020 5:20:48 PM By: [name], BSN, RN, CWS, [name] RN, BSN Entered By: 14/04/2020, BSN, RN, CWS, [name] on 10/30/2020 11:28:28 [id number] A. (09/14/1931) -------------------------------------------------------------------------------- Multi Wound Chart Details Patient Name: 06-11-1977 A. Date of Service: 10/30/2020 11:00 AM Medical Record Number: [name] Patient Account Number: [name] Date of Birth/Sex: September 19, 1931 (86 y.o. M) Treating RN: Elliot Gurney Primary Care Kidus Delman: Elliot Gurney Other Clinician: Referring Rae Plotner: 14/04/2020 Treating Cace Osorto/Extender: [name] in Treatment: 1 Vital Signs Height(in): Pulse(bpm): 72 Weight(lbs): Blood Pressure(mmHg): 136/83 Body Mass Index(BMI): Temperature(F): 97.6 Respiratory Rate(breaths/min): 20 Photos: [N/A:N/A] Wound Location: Left, Medial Lower Leg N/A N/A Wounding Event: Gradually Appeared N/A N/A Primary Etiology: Venous Leg Ulcer N/A N/A Comorbid History: Cataracts,  Sleep Apnea, Congestive N/A N/A Heart Failure, Coronary Artery Disease, Hypertension, Osteoarthritis Date Acquired: 09/25/2020 N/A N/A Weeks of Treatment: 1 N/A N/A Wound Status: Open N/A N/A Measurements L x W x D (cm) 2.1x1.2x0.1 N/A N/A Area (cm) : 1.979 N/A N/A Volume (cm) : 0.198 N/A N/A % Reduction in Area: -179.90% N/A N/A % Reduction in Volume: -178.90% N/A N/A Classification: Full Thickness Without Exposed N/A N/A Support Structures Exudate Amount: Large N/A N/A Exudate Type: Serous N/A  N/A Exudate Color: amber N/A N/A Wound Margin: Flat and Intact N/A N/A Granulation Amount: None Present (0%) N/A N/A Necrotic Amount: Large (67-100%) N/A N/A Exposed Structures: Fat Layer (Subcutaneous Tissue): N/A N/A Yes Fascia: No Tendon: No Muscle: No Joint: No Bone: No Epithelialization: None N/A N/A Treatment Notes Electronic Signature(s) Signed: 10/30/2020 5:01:11 PM By: Elliot Gurney, BSN, RN, CWS, [name] RN, BSN Entered By: Elliot Gurney, BSN, RN, CWS, [name] on 10/30/2020 17:01:11 [name] A. ([id number]) -------------------------------------------------------------------------------- Multi-Disciplinary Care Plan Details Patient Name: [name] A. Date of Service: 10/30/2020 11:00 AM Medical Record Number: [id number] Patient Account Number: [id number] Date of Birth/Sex: Apr 22, 1931 (86 y.o. M) Treating RN: Huel Coventry Primary Care Payslie Mccaig: [name] Other Clinician: Referring Shanie Mauzy: [name] Treating Alisi Lupien/Extender: Rowan Blase in Treatment: 1 Active Inactive Orientation to the Wound Care Program Nursing Diagnoses: Knowledge deficit related to the wound healing center program Goals: Patient/caregiver will verbalize understanding of the Wound Healing Center Program Date Initiated: 10/17/2020 Target Resolution Date: 10/24/2020 Goal Status: Active Interventions: Provide education on orientation to the wound center Notes: Venous Leg Ulcer Nursing  Diagnoses: Actual venous Insuffiency (use after diagnosis is confirmed) Potential for venous Insuffiency (use before diagnosis confirmed) Goals: Patient will maintain optimal edema control Date Initiated: 10/17/2020 Target Resolution Date: 10/31/2020 Goal Status: Active Interventions: Assess peripheral edema status every visit. Treatment Activities: Therapeutic compression applied : 10/17/2020 Notes: Wound/Skin Impairment Nursing Diagnoses: Impaired tissue integrity Goals: Patient/caregiver will verbalize understanding of skin care regimen Date Initiated: 10/17/2020 Target Resolution Date: 11/16/2020 Goal Status: Active Ulcer/skin breakdown will have a volume reduction of 30% by week 4 Date Initiated: 10/17/2020 Target Resolution Date: 12/18/2019 Goal Status: Active Ulcer/skin breakdown will have a volume reduction of 50% by week 8 Date Initiated: 10/17/2020 Target Resolution Date: 01/18/2020 Goal Status: Active Ulcer/skin breakdown will have a volume reduction of 80% by week 12 Date Initiated: 10/17/2020 Target Resolution Date: 02/15/2020 Goal Status: Active Ulcer/skin breakdown will heal within 14 weeks Date Initiated: 10/17/2020 Target Resolution Date: 02/28/2021 BRACY, PEPPER ([id number]) Goal Status: Active Interventions: Assess patient/caregiver ability to obtain necessary supplies Assess ulceration(s) every visit Provide education on ulcer and skin care Treatment Activities: Skin care regimen initiated : 10/17/2020 Notes: Electronic Signature(s) Signed: 10/30/2020 5:01:04 PM By: Elliot Gurney, BSN, RN, CWS, [name] RN, BSN Entered By: Elliot Gurney, BSN, RN, CWS, [name] on 10/30/2020 17:01:03 [name] A. ([id number]) -------------------------------------------------------------------------------- Pain Assessment Details Patient Name: [name] A. Date of Service: 10/30/2020 11:00 AM Medical Record Number: [id number] Patient Account Number: [id number] Date of Birth/Sex:  1931-11-19 (86 y.o. M) Treating RN: Huel Coventry Primary Care Reeda Soohoo: [name] Other Clinician: Referring Casanova Schurman: [name] Treating Jujhar Everett/Extender: Rowan Blase in Treatment: 1 Active Problems Location of Pain Severity and Description of Pain Patient Has Paino Yes Site Locations Pain Location: Pain in Ulcers Character of Pain Describe the Pain: Sharp, Tender Pain Management and Medication Current Pain Management: Electronic Signature(s) Signed: 10/31/2020 5:20:48 PM By: Elliot Gurney, BSN, RN, CWS, [name] RN, BSN Entered By: Elliot Gurney, BSN, RN, CWS, [name] on 10/30/2020 11:12:31 [name] ([id number]) -------------------------------------------------------------------------------- Patient/Caregiver Education Details Patient Name: [name] A. Date of Service: 10/30/2020 11:00 AM Medical Record Number: [id number] Patient Account Number: [id number] Date of Birth/Gender: January 31, 1931 (86 y.o. M) Treating RN: Huel Coventry Primary Care Physician: [name] Other Clinician: Referring Physician: [name] Treating Physician/Extender: Rowan Blase in Treatment: 1 Education Assessment Education Provided To: Patient Education Topics Provided Wound/Skin Impairment: Handouts: Caring for Your Ulcer Methods: Demonstration, Explain/Verbal Responses: State content correctly  Electronic Signature(s) Signed: 10/31/2020 5:20:48 PM By: Elliot GurneyWoody, BSN, RN, CWS, [name] RN, BSN Entered By: Elliot GurneyWoody, BSN, RN, CWS, [name] on 10/30/2020 17:05:44 [name], [name] ([id number]) -------------------------------------------------------------------------------- Wound Assessment Details Patient Name: [name], [name] Date of Service: 10/30/2020 11:00 AM Medical Record Number: [id number] Patient Account Number: [id number] Date of Birth/Sex: [birth year] (86 y.o. M) Treating RN: Huel CoventryWoody, [name] Primary Care Sarabella Caprio: Marcelino DusterJohnston, John Other Clinician: Referring Ezme Duch: Marcelino DusterJohnston, John Treating  Jamil Armwood/Extender: Allen DerryStone, Hoyt Weeks in Treatment: 1 Wound Status Wound Number: 8 Primary Venous Leg Ulcer Etiology: Wound Location: Left, Medial Lower Leg Wound Open Wounding Event: Gradually Appeared Status: Date Acquired: 09/25/2020 Comorbid Cataracts, Sleep Apnea, Congestive Heart Failure, Weeks Of Treatment: 1 History: Coronary Artery Disease, Hypertension, Osteoarthritis Clustered Wound: No Photos Wound Measurements Length: (cm) 2.1 Width: (cm) 1.2 Depth: (cm) 0.1 Area: (cm) 1.979 Volume: (cm) 0.198 % Reduction in Area: -179.9% % Reduction in Volume: -178.9% Epithelialization: None Wound Description Classification: Full Thickness Without Exposed Support Structures Wound Margin: Flat and Intact Exudate Amount: Large Exudate Type: Serous Exudate Color: amber Foul Odor After Cleansing: No Slough/Fibrino Yes Wound Bed Granulation Amount: None Present (0%) Exposed Structure Necrotic Amount: Large (67-100%) Fascia Exposed: No Necrotic Quality: Adherent Slough Fat Layer (Subcutaneous Tissue) Exposed: Yes Tendon Exposed: No Muscle Exposed: No Joint Exposed: No Bone Exposed: No Treatment Notes Wound #8 (Left, Medial Lower Leg) Notes ABD, kerlix and tape applied. Patient is going from here to the emergency department for evaluation. Electronic Signature(s) Signed: 10/30/2020 11:50:16 AM By: Elliot GurneyWoody, BSN, RN, CWS, [name] RN, BSN [name], [name] ([id number]) Entered By: Elliot GurneyWoody, BSN, RN, CWS, [name] on 10/30/2020 11:50:16 [name], [name] ([id number]) -------------------------------------------------------------------------------- Vitals Details Patient Name: [name], [name] Date of Service: 10/30/2020 11:00 AM Medical Record Number: [id number] Patient Account Number: [id number] Date of Birth/Sex: [birth year] (86 y.o. M) Treating RN: Huel CoventryWoody, [name] Primary Care Loralye Loberg: Marcelino DusterJohnston, John Other Clinician: Referring Riot Barrick: Marcelino DusterJohnston, John Treating Deaken Jurgens/Extender: Rowan BlaseStone,  Hoyt Weeks in Treatment: 1 Vital Signs Time Taken: 11:11 Temperature (F): 97.6 Pulse (bpm): 72 Respiratory Rate (breaths/min): 20 Blood Pressure (mmHg): 136/83 Reference Range: 80 - 120 mg / dl Electronic Signature(s) Signed: 10/31/2020 5:20:48 PM By: Elliot GurneyWoody, BSN, RN, CWS, [name] RN, BSN Entered By: Elliot GurneyWoody, BSN, RN, CWS, [name] on 10/30/2020 11:12:03

## 2020-11-02 ENCOUNTER — Other Ambulatory Visit
Admission: RE | Admit: 2020-11-02 | Discharge: 2020-11-02 | Disposition: A | Discharge status: Home / Self Care | Payer: Medicare Other | Source: Ambulatory Visit | Attending: Internal Medicine | Admitting: Internal Medicine

## 2020-11-02 DIAGNOSIS — Z7901 Long term (current) use of anticoagulants: Secondary | ICD-10-CM | POA: Insufficient documentation

## 2020-11-02 DIAGNOSIS — I4891 Unspecified atrial fibrillation: Secondary | ICD-10-CM | POA: Insufficient documentation

## 2020-11-02 LAB — PROTIME-INR
INR: 2.4 — ABNORMAL HIGH (ref 0.8–1.2)
Prothrombin Time: 25.3 seconds — ABNORMAL HIGH (ref 11.4–15.2)

## 2020-11-08 ENCOUNTER — Ambulatory Visit (INDEPENDENT_AMBULATORY_CARE_PROVIDER_SITE_OTHER): Payer: Medicare Other | Admitting: Nurse Practitioner

## 2020-11-13 ENCOUNTER — Encounter: Payer: Medicare Other | Admitting: Physician Assistant

## 2020-11-13 ENCOUNTER — Other Ambulatory Visit: Payer: Self-pay

## 2020-11-13 NOTE — Progress Notes (Addendum)
DEQUANE, STRAHAN ([id number]) Visit Report for 11/13/2020 Chief Complaint Document Details Patient Name: [name], [name] Date of Service: 11/13/2020 8:15 AM Medical Record Number: [id number] Patient Account Number: [id number] Date of Birth/Sex: 04/10/1931 (86 y.o. M) Treating RN: Huel Coventry Primary Care Provider: [name] Other Clinician: Referring Provider: [name] Treating Provider/Extender: Rowan Blase in Treatment: 3 Information Obtained from: Patient Chief Complaint Left Medical LE Ulcers Electronic Signature(s) Signed: 11/13/2020 8:33:22 AM By: Lenda Kelp PA-C Entered By: Lenda Kelp on 11/13/2020 08:33:22 [name] ([id number]) -------------------------------------------------------------------------------- Debridement Details Patient Name: [name] Hilts A. Date of Service: 11/13/2020 8:15 AM Medical Record Number: [id number] Patient Account Number: [id number] Date of Birth/Sex: 07-03-1931 (86 y.o. M) Treating RN: Huel Coventry Primary Care Provider: [name] Other Clinician: Referring Provider: [name] Treating Provider/Extender: Allen Derry Weeks in Treatment: 3 Debridement Performed for Wound #8 Left,Medial Lower Leg Assessment: Performed By: Physician Nelida Meuse., PA-C Debridement Type: Debridement Severity of Tissue Pre Debridement: Fat layer exposed Level of Consciousness (Pre- Awake and Alert procedure): Pre-procedure Verification/Time Out Yes - 08:40 Taken: Total Area Debrided (L x W): 1 (cm) x 1 (cm) = 1 (cm) Tissue and other material Non-Viable, Slough, Subcutaneous, Slough debrided: Level: Skin/Subcutaneous Tissue Debridement Description: Excisional Instrument: Curette Bleeding: Minimum Hemostasis Achieved: Pressure Response to Treatment: Procedure was tolerated well Level of Consciousness (Post- Awake and Alert procedure): Post Debridement Measurements of Total Wound Length: (cm) 1 Width:  (cm) 1 Depth: (cm) 0.1 Volume: (cm) 0.079 Character of Wound/Ulcer Post Debridement: Stable Severity of Tissue Post Debridement: Fat layer exposed Post Procedure Diagnosis Same as Pre-procedure Electronic Signature(s) Signed: 11/13/2020 4:39:05 PM By: Elliot Gurney, BSN, RN, CWS, [name] RN, BSN Signed: 11/14/2020 5:24:08 PM By: Lenda Kelp PA-C Entered By: Elliot Gurney, BSN, RN, CWS, [name] on 11/13/2020 08:40:48 [name] Hilts A. ([id number]) -------------------------------------------------------------------------------- HPI Details Patient Name: [name] Hilts A. Date of Service: 11/13/2020 8:15 AM Medical Record Number: [id number] Patient Account Number: [id number] Date of Birth/Sex: December 07, 1930 (86 y.o. M) Treating RN: Huel Coventry Primary Care Provider: [name] Other Clinician: Referring Provider: [name] Treating Provider/Extender: Rowan Blase in Treatment: 3 History of Present Illness HPI Description: 04/21/2019 ADMISSION This is an independent 86 year old man who lives in the independent part of [address] of [place]. He has a history of chronic lower extremity edema and wears compression stockings. He states about a month ago he was taking off the one on the right and pulled some skin off accidentally. He has had 2 small wounds on the right anterior and right medial lower extremity. I note looking through [facility] health link that he had multiple venous ultrasounds in 2015 and 16. These were DVT rule outs. He did have a Baker's cyst on the left. He has not not had a previous wound history although he did have a history of cellulitis in his legs. Past medical history; includes aortic stenosis status post mechanical AVR in 2007 on chronic Coumadin, lower extremity edema with a history of cellulitis, history of MRSA. ABIs in our clinic were 1.1 on the right 6/3; patient with predominantly venous insufficiency ulcers in the right lower leg probably some degree of lymphedema. He had  to wounds last week. We put him in 3 layer compression. The nurses at [facility] are changing the dressing. The area laterally is healed but he still has a small very painful area on the anterior tibial area. He is on Coumadin because of mechanical aortic valve. 6/10; venous insufficiency ulcers on  the right lower leg. He has some degree of lymphedema. We have been using 3 layer compression with silver collagen small wound. Dimensions are improved. I gave him doxycycline last week which he tolerated because of surrounding erythema. This is improved also 6/17; arrives in clinic today with portable silver collagen dressing tightly adherent to the wound. Also felt that the wrap was too tight 3 layer being changed at the [facility] 6/24; patient's wound is small still adherent debris over the surface however even under illumination it was hard to see what was still open here. We have been using silver collagen 7/1; the patient arrives in the clinic today and the area on the right lower leg is healed. He has severe chronic venous insufficiency. He has 20/30 compression stockings. Readmission: 06/22/2020 on evaluation today patient presents for reevaluation here in our clinic although it has been a little over a year since we last saw him. He does have a history of lymphedema, venous insufficiency, anticoagulant therapy, he is on a pacemaker which is the reason for the anticoagulants, hypertension, and congestive heart failure. With that being said unfortunately he has been dealing with a wound of the left and right legs which has been giving him some trouble since arrival first 2021. That is in regard to the left leg ulcer. In regard to the right leg ulcer this is just a very small area that really I think will seal up quite nicely is more of the lymphedema opening than anything else. With that being said unfortunately the left leg is quite significant. We actually have noted that the  patient's been dealing with this for quite some time and I think that there is a chance we may want to consider biopsy. With that being said he is on Coumadin therefore we were not able to perform a biopsy at this point. I think that something however in the future that may be warranted we may just have to take him off of the current Coumadin regimen in order to do this. We discussed doing it today but I am more concerned about the fact that he could have issues with uncontrolled bleeding and in that case we may have to send him to the ER. His daughter was not wanting to take that chance which I can completely understand. Nonetheless so far they have been using antibiotic ointment over the area I am just putting a protective dressing I do believe the patient is that he needs some type of compression. 07/07/2020 on evaluation today patient appears to be doing poorly in regard to his bilateral lower extremities today. He actually went to urgent care last night due to having pain in his legs due to the wraps. It appears they got somewhat tight on him because of the fact that he is having such significant swelling. He appears to be fluid overloaded I do not see any signs of infection actively at this point which is good news but unfortunately I think that this is something that may need to be addressed even by cardiology more so than just with a different or alternate type of wrap. I will contact his cardiologist, Dr. Mariah Milling, as well. 07/10/2020 upon evaluation today patient unfortunately appears to be doing worse even than when I saw him on Friday from the standpoint of where his legs stand. The main issue is that he has increased erythema although there is still not warm to touch I am concerned about infection and about this getting significantly  worse. Again my initial concern was more fluid overload and I discussed this with his cardiology office on Friday. With that being said I feel like this has  gotten worse his daughter is in agreement and I think that the best option would be to send him to the hospital at this point. READMISSION 09/06/2020 Is a patient who has had 2 stays in this clinic firstly in 2020 and more recently from 06/02/2020 through 07/10/2020. He was cared for by Allen Derry. When he was last here he had bilateral lower extremity ulcers in the setting of chronic venous insufficiency with lymphedema. When he we last saw him he had weeping edema fluido Cellulitis and he was sent to the hospital. He was admitted initially from 06/30/2020 through 07/14/2020 with cellulitis plus stasis dermatitis. Treated empirically with vancomycin and had increases in his torsemide. He was then sent to the skilled level of [facility] but readmitted to hospital from 07/29/2020 through 08/10/2020 with blood loss anemia hemoglobin of 4 and OB positive stools. During this hospitalization he was noted to have a sacral wound at stage II. He was sent to [facility] [place] again in the skilled level. He had compression on his legs with Coban. He is now back at his independent living setting. He has not been wearing compression and his daughter feels there is already increasing swelling. To have stockings at home not exactly sure at what strength. [name], [name] ([id number]) Past medical history includes lymphedema, chronic venous insufficiency, pacemaker, congestive heart failure, hypertension, aortic valve stenosis status post mechanical valve replacement in 2002 on chronic Coumadin, chronic kidney disease stage IIIb, atrial fibrillation, peripheral neuropathy, large hiatal hernia. We did not test his ABIs in the clinic today. Readmission: 10/17/2020 on evaluation today patient appears to be doing somewhat poorly in regard to his left leg and the fact that he does have an open wound at this point. He has not had the compression socks placed at the facility they have not been putting them on at  all unfortunately. He does not have on the right leg currently and apparently there is significant put him on the left leg with the wound which I understand is that can be somewhat tight obviously. Nonetheless I do believe the right leg needs to have a compression sock and I do believe on the left side right now we can do something a little better in order to try to get the wound healed at this point. The patient is on torsemide and currently allopurinol for gout. He also has daily weights due to congestive heart failure I am assuming in order to monitor his fluid status overall. He is currently a DNR at the facility. 10/30/2020 upon evaluation today patient appears to be doing somewhat poorly at this time in regard to his bilateral lower extremities. His left leg which is the only place where he had a wound last week when I saw him is actually doing worse with new wound openings. The right leg also has a small wound as well. However the most concerning thing is that last week he did not have any signs of infection or erythema there is no warmth to touch over the leg. This week the entire leg is more sore he also has erythema extending from the ankle to just below the knee with the area being erythematous and warm to touch which I believe represents cellulitis at minimum. I am concerned about how this is spread so quickly and the  fact that he is having increased pain and also not feeling as well. All in all I discussed with the patient that unfortunately I feel like he may be best served going to the ER for further evaluation of this issue currently to try to see about the potential for IV antibiotics and to have appropriate lab work performed. His daughter is present during the visit today and she was included in the decision making at this point. 11/13/2020 upon evaluation today patient appears to be doing well with regard to his leg ulcer. Fortunately he does not appear to be showing signs of  infection anything like it was previous. This is great news. He was sent home with IV antibiotic therapy that seems to have done extremely well for him. No fevers, chills, nausea, vomiting, or diarrhea. Electronic Signature(s) Signed: 11/13/2020 8:47:39 AM By: Lenda Kelp PA-C Entered By: Lenda Kelp on 11/13/2020 08:47:39 [name] ([id number]) -------------------------------------------------------------------------------- Physical Exam Details Patient Name: [name] Hilts A. Date of Service: 11/13/2020 8:15 AM Medical Record Number: [id number] Patient Account Number: [id number] Date of Birth/Sex: [birth year] (86 y.o. M) Treating RN: Huel Coventry Primary Care Provider: [name] Other Clinician: Referring Provider: [name] Treating Provider/Extender: Allen Derry Weeks in Treatment: 3 Constitutional Well-nourished and well-hydrated in no acute distress. Respiratory normal breathing without difficulty. Psychiatric this patient is able to make decisions and demonstrates good insight into disease process. Alert and Oriented x 3. pleasant and cooperative. Notes Patient's wound bed actually showed signs of good granulation at this he did have some slough noted I carefully clean this off nothing too aggressive today. Overall I am very pleased in regard to his leg ulceration. With that being said he apparently has a wound on his gluteal region as well we did not have him in the room to be able to evaluate that today we will have to do that at the next visit. Nonetheless that is something to look out at that point as well. In the meantime at the [facility] they have been treating this. Electronic Signature(s) Signed: 11/13/2020 8:48:16 AM By: Lenda Kelp PA-C Entered By: Lenda Kelp on 11/13/2020 08:48:16 [name] ([id number]) -------------------------------------------------------------------------------- Physician Orders Details Patient  Name: [name] Hilts A. Date of Service: 11/13/2020 8:15 AM Medical Record Number: [id number] Patient Account Number: [id number] Date of Birth/Sex: [birth year] (86 y.o. M) Treating RN: Huel Coventry Primary Care Provider: [name] Other Clinician: Referring Provider: [name] Treating Provider/Extender: Rowan Blase in Treatment: 3 Verbal / Phone Orders: No Diagnosis Coding ICD-10 Coding Code Description I89.0 Lymphedema, not elsewhere classified I87.2 Venous insufficiency (chronic) (peripheral) L97.822 Non-pressure chronic ulcer of other part of left lower leg with fat layer exposed L97.812 Non-pressure chronic ulcer of other part of right lower leg with fat layer exposed Z79.01 Long term (current) use of anticoagulants Z95.0 Presence of cardiac pacemaker I10 Essential (primary) hypertension I50.42 Chronic combined systolic (congestive) and diastolic (congestive) heart failure I25.10 Atherosclerotic heart disease of native coronary artery without angina pectoris Wound Cleansing Wound #8 Left,Medial Lower Leg o May Shower, gently pat wound dry prior to applying new dressing. Wound #9 Left Calcaneus o May Shower, gently pat wound dry prior to applying new dressing. Anesthetic (add to Medication List) Wound #8 Left,Medial Lower Leg o Topical Lidocaine 4% cream applied to wound bed prior to debridement (In Clinic Only). Skin Barriers/Peri-Wound Care Wound #8 Left,Medial Lower Leg o Barrier cream Primary Wound Dressing Wound #8 Left,Medial Lower Leg o Silver  Alginate Wound #9 Left Calcaneus o Other: - triple antibiotic ointment Secondary Dressing Wound #8 Left,Medial Lower Leg o ABD and Kerlix/Conform Wound #9 Left Calcaneus o ABD and Kerlix/Conform Dressing Change Frequency Wound #8 Left,Medial Lower Leg o Change Dressing Monday, Wednesday, Friday Wound #9 Left Calcaneus o Change Dressing Monday, Wednesday, Friday [facility]  [name] ([id number]) Wound #8 Left,Medial Lower Leg o Return Appointment in 2 weeks. Wound #9 Left Calcaneus o Return Appointment in 2 weeks. Edema Control Wound #8 Left,Medial Lower Leg o Patient to wear own compression stockings o Other: - Tubi-F in the clinic Wound #9 Left Calcaneus o Patient to wear own compression stockings o Other: - Tubi-F in the clinic Additional Orders / Instructions Wound #8 Left,Medial Lower Leg o Increase protein intake. Electronic Signature(s) Signed: 11/13/2020 4:39:05 PM By: Elliot GurneyWoody, BSN, RN, CWS, [name] RN, BSN Signed: 11/14/2020 5:24:08 PM By: Lenda KelpStone III, Axzel Rockhill PA-C Entered By: Elliot GurneyWoody, BSN, RN, CWS, [name] on 11/13/2020 08:42:38 [name] QuailsSCHENK, [name] ([id number]) -------------------------------------------------------------------------------- Problem List Details Patient Name: [name] HiltsSCHENK, [name] Date of Service: 11/13/2020 8:15 AM Medical Record Number: [id number] Patient Account Number: [id number] Date of Birth/Sex: 1931-08-05 (86 y.o. M) Treating RN: Huel CoventryWoody, [name] Primary Care Provider: Marcelino DusterJohnston, John Other Clinician: Referring Provider: Marcelino DusterJohnston, John Treating Provider/Extender: Rowan BlaseStone, Daud Cayer Weeks in Treatment: 3 Active Problems ICD-10 Encounter Code Description Active Date MDM Diagnosis I89.0 Lymphedema, not elsewhere classified 10/17/2020 No Yes I87.2 Venous insufficiency (chronic) (peripheral) 10/17/2020 No Yes L97.822 Non-pressure chronic ulcer of other part of left lower leg with fat layer 10/17/2020 No Yes exposed L97.812 Non-pressure chronic ulcer of other part of right lower leg with fat layer 10/17/2020 No Yes exposed Z79.01 Long term (current) use of anticoagulants 10/17/2020 No Yes Z95.0 Presence of cardiac pacemaker 10/17/2020 No Yes I10 Essential (primary) hypertension 10/17/2020 No Yes I50.42 Chronic combined systolic (congestive) and diastolic (congestive) heart 10/17/2020 No Yes failure I25.10  Atherosclerotic heart disease of native coronary artery without angina 10/17/2020 No Yes pectoris Inactive Problems Resolved Problems Electronic Signature(s) Signed: 11/13/2020 8:33:17 AM By: Lenda KelpStone III, Asheton Viramontes PA-C Entered By: Lenda KelpStone III, Demar Shad on 11/13/2020 08:33:16 [name], [name] ([id number]) -------------------------------------------------------------------------------- Progress Note Details Patient Name: [name] HiltsSCHENK, Aleem A. Date of Service: 11/13/2020 8:15 AM Medical Record Number: [id number] Patient Account Number: [id number] Date of Birth/Sex: 1931-08-05 (86 y.o. M) Treating RN: Huel CoventryWoody, [name] Primary Care Provider: Marcelino DusterJohnston, John Other Clinician: Referring Provider: Marcelino DusterJohnston, John Treating Provider/Extender: Rowan BlaseStone, Ismael Treptow Weeks in Treatment: 3 Subjective Chief Complaint Information obtained from Patient Left Medical LE Ulcers History of Present Illness (HPI) 04/21/2019 ADMISSION This is an independent 86 year old man who lives in the [place] part of [address]. He has a history of chronic lower extremity edema and wears compression stockings. He states about a month ago he was taking off the one on the right and pulled some skin off accidentally. He has had 2 small wounds on the right anterior and right medial lower extremity. I note [facility] link that he had multiple venous ultrasounds in 2015 and 16. These were DVT rule outs. He did have a Baker's cyst on the left. He has not not had a previous wound history although he did have a history of cellulitis in his legs. Past medical history; includes aortic stenosis status post mechanical AVR in 2007 on chronic Coumadin, lower extremity edema with a history of cellulitis, history of MRSA. ABIs in our clinic were 1.1 on the right 6/3; patient with predominantly venous insufficiency ulcers in the right lower leg  probably some degree of lymphedema. He had to wounds last week. We put him in 3 layer  compression. The nurses at [facility] are changing the dressing. The area laterally is healed but he still has a small very painful area on the anterior tibial area. He is on Coumadin because of mechanical aortic valve. 6/10; venous insufficiency ulcers on the right lower leg. He has some degree of lymphedema. We have been using 3 layer compression with silver collagen small wound. Dimensions are improved. I gave him doxycycline last week which he tolerated because of surrounding erythema. This is improved also 6/17; arrives in clinic today with portable silver collagen dressing tightly adherent to the wound. Also felt that the wrap was too tight 3 layer being changed at the [facility] 6/24; patient's wound is small still adherent debris over the surface however even under illumination it was hard to see what was still open here. We have been using silver collagen 7/1; the patient arrives in the clinic today and the area on the right lower leg is healed. He has severe chronic venous insufficiency. He has 20/30 compression stockings. Readmission: 06/22/2020 on evaluation today patient presents for reevaluation here in our clinic although it has been a little over a year since we last saw him. He does have a history of lymphedema, venous insufficiency, anticoagulant therapy, he is on a pacemaker which is the reason for the anticoagulants, hypertension, and congestive heart failure. With that being said unfortunately he has been dealing with a wound of the left and right legs which has been giving him some trouble since arrival first 2021. That is in regard to the left leg ulcer. In regard to the right leg ulcer this is just a very small area that really I think will seal up quite nicely is more of the lymphedema opening than anything else. With that being said unfortunately the left leg is quite significant. We actually have noted that the patient's been dealing with this for quite  some time and I think that there is a chance we may want to consider biopsy. With that being said he is on Coumadin therefore we were not able to perform a biopsy at this point. I think that something however in the future that may be warranted we may just have to take him off of the current Coumadin regimen in order to do this. We discussed doing it today but I am more concerned about the fact that he could have issues with uncontrolled bleeding and in that case we may have to send him to the ER. His daughter was not wanting to take that chance which I can completely understand. Nonetheless so far they have been using antibiotic ointment over the area I am just putting a protective dressing I do believe the patient is that he needs some type of compression. 07/07/2020 on evaluation today patient appears to be doing poorly in regard to his bilateral lower extremities today. He actually went to urgent care last night due to having pain in his legs due to the wraps. It appears they got somewhat tight on him because of the fact that he is having such significant swelling. He appears to be fluid overloaded I do not see any signs of infection actively at this point which is good news but unfortunately I think that this is something that may need to be addressed even by cardiology more so than just with a different or alternate type of wrap. I  will contact his cardiologist, Dr. Mariah Milling, as well. 07/10/2020 upon evaluation today patient unfortunately appears to be doing worse even than when I saw him on Friday from the standpoint of where his legs stand. The main issue is that he has increased erythema although there is still not warm to touch I am concerned about infection and about this getting significantly worse. Again my initial concern was more fluid overload and I discussed this with his cardiology office on Friday. With that being said I feel like this has gotten worse his daughter is in agreement and I  think that the best option would be to send him to the hospital at this point. READMISSION 09/06/2020 Is a patient who has had 2 stays in this clinic firstly in 2020 and more recently from 06/02/2020 through 07/10/2020. He was cared for by Allen Derry. When he was last here he had bilateral lower extremity ulcers in the setting of chronic venous insufficiency with lymphedema. When he we last saw him he had weeping edema fluido Cellulitis and he was sent to the hospital. He was admitted initially from 06/30/2020 through 07/14/2020 [facility], Corin A. ([id number]) with cellulitis plus stasis dermatitis. Treated empirically with vancomycin and had increases in his torsemide. He was then sent to the skilled level of [facility] but readmitted to hospital from 07/29/2020 through 08/10/2020 with blood loss anemia hemoglobin of 4 and OB positive stools. During this hospitalization he was noted to have a sacral wound at stage II. He was sent to [facility] [place] again in the skilled level. He had compression on his legs with Coban. He is now back at his independent living setting. He has not been wearing compression and his daughter feels there is already increasing swelling. To have stockings at home not exactly sure at what strength. Past medical history includes lymphedema, chronic venous insufficiency, pacemaker, congestive heart failure, hypertension, aortic valve stenosis status post mechanical valve replacement in 2002 on chronic Coumadin, chronic kidney disease stage IIIb, atrial fibrillation, peripheral neuropathy, large hiatal hernia. We did not test his ABIs in the clinic today. Readmission: 10/17/2020 on evaluation today patient appears to be doing somewhat poorly in regard to his left leg and the fact that he does have an open wound at this point. He has not had the compression socks placed at the facility they have not been putting them on at all unfortunately. He does not have on the right  leg currently and apparently there is significant put him on the left leg with the wound which I understand is that can be somewhat tight obviously. Nonetheless I do believe the right leg needs to have a compression sock and I do believe on the left side right now we can do something a little better in order to try to get the wound healed at this point. The patient is on torsemide and currently allopurinol for gout. He also has daily weights due to congestive heart failure I am assuming in order to monitor his fluid status overall. He is currently a DNR at the facility. 10/30/2020 upon evaluation today patient appears to be doing somewhat poorly at this time in regard to his bilateral lower extremities. His left leg which is the only place where he had a wound last week when I saw him is actually doing worse with new wound openings. The right leg also has a small wound as well. However the most concerning thing is that last week he did not have any signs  of infection or erythema there is no warmth to touch over the leg. This week the entire leg is more sore he also has erythema extending from the ankle to just below the knee with the area being erythematous and warm to touch which I believe represents cellulitis at minimum. I am concerned about how this is spread so quickly and the fact that he is having increased pain and also not feeling as well. All in all I discussed with the patient that unfortunately I feel like he may be best served going to the ER for further evaluation of this issue currently to try to see about the potential for IV antibiotics and to have appropriate lab work performed. His daughter is present during the visit today and she was included in the decision making at this point. 11/13/2020 upon evaluation today patient appears to be doing well with regard to his leg ulcer. Fortunately he does not appear to be showing signs of infection anything like it was previous. This is great  news. He was sent home with IV antibiotic therapy that seems to have done extremely well for him. No fevers, chills, nausea, vomiting, or diarrhea. Objective Constitutional Well-nourished and well-hydrated in no acute distress. Vitals Time Taken: 8:20 AM, Temperature: 97.8 F, Pulse: 76 bpm, Respiratory Rate: 16 breaths/min, Blood Pressure: 116/67 mmHg. Respiratory normal breathing without difficulty. Psychiatric this patient is able to make decisions and demonstrates good insight into disease process. Alert and Oriented x 3. pleasant and cooperative. General Notes: Patient's wound bed actually showed signs of good granulation at this he did have some slough noted I carefully clean this off nothing too aggressive today. Overall I am very pleased in regard to his leg ulceration. With that being said he apparently has a wound on his gluteal region as well we did not have him in the room to be able to evaluate that today we will have to do that at the next visit. Nonetheless that is something to look out at that point as well. In the meantime at the [facility] they have been treating this. Integumentary (Hair, Skin) Wound #8 status is Open. Original cause of wound was Gradually Appeared. The wound is located on the Left,Medial Lower Leg. The wound measures 1cm length x 1cm width x 0.1cm depth; 0.785cm^2 area and 0.079cm^3 volume. There is Fat Layer (Subcutaneous Tissue) exposed. There is no tunneling or undermining noted. There is a large amount of serous drainage noted. The wound margin is flat and intact. There is no granulation within the wound bed. There is a large (67-100%) amount of necrotic tissue within the wound bed including Adherent Slough. Wound #9 status is Open. Original cause of wound was Gradually Appeared. The wound is located on the Left Calcaneus. The wound measures 0.4cm length x 1.2cm width x 0.1cm depth; 0.377cm^2 area and 0.038cm^3 volume. There is no tunneling or  undermining noted. There is a none present amount of drainage noted. There is no granulation within the wound bed. There is a large (67-100%) amount of necrotic tissue within the wound bed including Eschar. [name], [name] ([id number]) Assessment Active Problems ICD-10 Lymphedema, not elsewhere classified Venous insufficiency (chronic) (peripheral) Non-pressure chronic ulcer of other part of left lower leg with fat layer exposed Non-pressure chronic ulcer of other part of right lower leg with fat layer exposed Long term (current) use of anticoagulants Presence of cardiac pacemaker Essential (primary) hypertension Chronic combined systolic (congestive) and diastolic (congestive) heart failure Atherosclerotic heart disease  of native coronary artery without angina pectoris Procedures Wound #8 Pre-procedure diagnosis of Wound #8 is a Venous Leg Ulcer located on the Left,Medial Lower Leg .Severity of Tissue Pre Debridement is: Fat layer exposed. There was a Excisional Skin/Subcutaneous Tissue Debridement with a total area of 1 sq cm performed by Nelida Meuse., PA-C. With the following instrument(s): Curette to remove Non-Viable tissue/material. Material removed includes Subcutaneous Tissue and Slough and. No specimens were taken. A time out was conducted at 08:40, prior to the start of the procedure. A Minimum amount of bleeding was controlled with Pressure. The procedure was tolerated well. Post Debridement Measurements: 1cm length x 1cm width x 0.1cm depth; 0.079cm^3 volume. Character of Wound/Ulcer Post Debridement is stable. Severity of Tissue Post Debridement is: Fat layer exposed. Post procedure Diagnosis Wound #8: Same as Pre-Procedure Plan Wound Cleansing: Wound #8 Left,Medial Lower Leg: May Shower, gently pat wound dry prior to applying new dressing. Wound #9 Left Calcaneus: May Shower, gently pat wound dry prior to applying new dressing. Anesthetic (add to Medication  List): Wound #8 Left,Medial Lower Leg: Topical Lidocaine 4% cream applied to wound bed prior to debridement (In Clinic Only). Skin Barriers/Peri-Wound Care: Wound #8 Left,Medial Lower Leg: Barrier cream Primary Wound Dressing: Wound #8 Left,Medial Lower Leg: Silver Alginate Wound #9 Left Calcaneus: Other: - triple antibiotic ointment Secondary Dressing: Wound #8 Left,Medial Lower Leg: ABD and Kerlix/Conform Wound #9 Left Calcaneus: ABD and Kerlix/Conform Dressing Change Frequency: Wound #8 Left,Medial Lower Leg: Change Dressing Monday, Wednesday, Friday Wound #9 Left Calcaneus: Change Dressing Monday, Wednesday, Friday Follow-up Appointments: Wound #8 Left,Medial Lower Leg: Return Appointment in 2 weeks. Wound #9 Left Calcaneus: Return Appointment in 2 weeks. Edema Control: Wound #8 Left,Medial Lower Leg: Patient to wear own compression stockings Other: - Tubi-F in the clinic Wound #9 Left Calcaneus: Patient to wear own compression stockings Other: - Tubi-F in the clinic [facility], Siddiq A. ([id number]) Additional Orders / Instructions: Wound #8 Left,Medial Lower Leg: Increase protein intake. 1. Would recommend currently that regarding continue with the wound care measures as before and the patient is in agreement with the plan that is including a silver alginate dressing to the leg ulceration region I think that is appropriate. 2. I am also can recommend currently that we continue with some triple antibiotic ointment on the back of the heel where he has a small area I cannot even tell if this is truly open but I am not able to get the eschar off there for me to treated as such try to loosen this up a little triple antibiotic ointment in the meantime. We will see patient back for reevaluation in 2 weeks here in the clinic. If anything worsens or changes patient will contact our office for additional recommendations. Electronic Signature(s) Signed: 11/13/2020 8:48:55 AM By:  Lenda Kelp PA-C Entered By: Lenda Kelp on 11/13/2020 08:48:55 [name] Hilts AMarland Kitchen ([id number]) -------------------------------------------------------------------------------- SuperBill Details Patient Name: [name] Hilts A. Date of Service: 11/13/2020 Medical Record Number: [id number] Patient Account Number: [id number] Date of Birth/Sex: May 11, 1931 (86 y.o. M) Treating RN: Huel Coventry Primary Care Provider: [name] Other Clinician: Referring Provider: [name] Treating Provider/Extender: Rowan Blase in Treatment: 3 Diagnosis Coding ICD-10 Codes Code Description I89.0 Lymphedema, not elsewhere classified I87.2 Venous insufficiency (chronic) (peripheral) L97.822 Non-pressure chronic ulcer of other part of left lower leg with fat layer exposed L97.812 Non-pressure chronic ulcer of other part of right lower leg with fat layer exposed Z79.01 Long term (current) use of  anticoagulants Z95.0 Presence of cardiac pacemaker I10 Essential (primary) hypertension I50.42 Chronic combined systolic (congestive) and diastolic (congestive) heart failure I25.10 Atherosclerotic heart disease of native coronary artery without angina pectoris Facility Procedures CPT4 Code: [id number] Description: 11042 - DEB SUBQ TISSUE 20 SQ CM/< Modifier: Quantity: 1 CPT4 Code: Description: ICD-10 Diagnosis Description L97.822 Non-pressure chronic ulcer of other part of left lower leg with fat layer Modifier: exposed Quantity: Physician Procedures CPT4 Code: [id number] Description: 11042 - WC PHYS SUBQ TISS 20 SQ CM Modifier: Quantity: 1 CPT4 Code: Description: ICD-10 Diagnosis Description L97.822 Non-pressure chronic ulcer of other part of left lower leg with fat layer Modifier: exposed Quantity: Electronic Signature(s) Signed: 11/13/2020 8:49:37 AM By: Lenda Kelp PA-C Entered By: Lenda Kelp on 11/13/2020 08:49:36

## 2020-11-13 NOTE — Progress Notes (Signed)
TEEJAY, MEADER ([id number]) Visit Report for 11/13/2020 Arrival Information Details Patient Name: [name], [name] Date of Service: 11/13/2020 8:15 AM Medical Record Number: [id number] Patient Account Number: [id number] Date of Birth/Sex: 06/26/1931 (86 y.o. M) Treating RN: Huel Coventry Primary Care Auriella Wieand: [name] Other Clinician: Referring Somaly Marteney: [name] Treating Selestino Nila/Extender: Rowan Blase in Treatment: 3 Visit Information History Since Last Visit Added or deleted any medications: No Patient Arrived: Wheel Chair Any new allergies or adverse reactions: No Arrival Time: 08:18 Had a fall or experienced change in No Accompanied By: daughter activities of daily living that may affect Transfer Assistance: None risk of falls: Patient Identification Verified: Yes Signs or symptoms of abuse/neglect since last visito No Secondary Verification Process Completed: Yes Hospitalized since last visit: No Has Dressing in Place as Prescribed: Yes Has Compression in Place as Prescribed: Yes Pain Present Now: Yes Electronic Signature(s) Signed: 11/13/2020 4:39:32 PM By: Dayton Martes RCP, RRT, CHT Entered By: Dayton Martes on 11/13/2020 08:20:05 Joellyn Quails ([id number]) -------------------------------------------------------------------------------- Encounter Discharge Information Details Patient Name: [name] Hilts A. Date of Service: 11/13/2020 8:15 AM Medical Record Number: [id number] Patient Account Number: [id number] Date of Birth/Sex: [birth year] (86 y.o. M) Treating RN: Huel Coventry Primary Care Marialuisa Basara: [name] Other Clinician: Referring Michela Herst: [name] Treating Krzysztof Reichelt/Extender: Rowan Blase in Treatment: 3 Encounter Discharge Information Items Post Procedure Vitals Discharge Condition: Stable Unable to obtain vitals Reason: . Ambulatory Status: Wheelchair Discharge Destination:  Home Transportation: Private Auto Accompanied By: daughter Schedule Follow-up Appointment: Yes Clinical Summary of Care: Electronic Signature(s) Signed: 11/13/2020 11:47:35 AM By: Elliot Gurney, BSN, RN, CWS, [name] RN, BSN Entered By: Elliot Gurney, BSN, RN, CWS, [name] on 11/13/2020 11:47:35 Joellyn Quails ([id number]) -------------------------------------------------------------------------------- Lower Extremity Assessment Details Patient Name: [name] Hilts A. Date of Service: 11/13/2020 8:15 AM Medical Record Number: [id number] Patient Account Number: [id number] Date of Birth/Sex: 15-Jan-1931 (86 y.o. M) Treating RN: Huel Coventry Primary Care Xandrea Clarey: [name] Other Clinician: Referring Adora Yeh: [name] Treating Tarez Bowns/Extender: Allen Derry Weeks in Treatment: 3 Edema Assessment Assessed: [Left: No] [Right: No] [Left: Edema] [Right: :] Calf Left: Right: Point of Measurement: 33 cm From Medial Instep 38 cm Ankle Left: Right: Point of Measurement: 10 cm From Medial Instep 23 cm Vascular Assessment Pulses: Dorsalis Pedis Palpable: [Left:Yes] Electronic Signature(s) Signed: 11/13/2020 4:39:05 PM By: Elliot Gurney, BSN, RN, CWS, [name] RN, BSN Entered By: Elliot Gurney, BSN, RN, CWS, [name] on 11/13/2020 08:30:55 [name] Hilts A. ([id number]) -------------------------------------------------------------------------------- Multi Wound Chart Details Patient Name: [name] Hilts A. Date of Service: 11/13/2020 8:15 AM Medical Record Number: [id number] Patient Account Number: [id number] Date of Birth/Sex: 04-26-1931 (86 y.o. M) Treating RN: Huel Coventry Primary Care Terez Freimark: [name] Other Clinician: Referring Precious Gilchrest: [name] Treating Joan Herschberger/Extender: Rowan Blase in Treatment: 3 Vital Signs Height(in): Pulse(bpm): 76 Weight(lbs): Blood Pressure(mmHg): 116/67 Body Mass Index(BMI): Temperature(F): 97.8 Respiratory Rate(breaths/min): 16 Photos: [N/A:N/A] Wound Location:  Left, Medial Lower Leg N/A N/A Wounding Event: Gradually Appeared N/A N/A Primary Etiology: Venous Leg Ulcer N/A N/A Comorbid History: Cataracts, Sleep Apnea, Congestive N/A N/A Heart Failure, Coronary Artery Disease, Hypertension, Osteoarthritis Date Acquired: 09/25/2020 N/A N/A Weeks of Treatment: 3 N/A N/A Wound Status: Open N/A N/A Measurements L x W x D (cm) 1x1x0.1 N/A N/A Area (cm) : 0.785 N/A N/A Volume (cm) : 0.079 N/A N/A % Reduction in Area: -11.00% N/A N/A % Reduction in Volume: -11.30% N/A N/A Classification: Full Thickness Without Exposed N/A N/A Support Structures Exudate Amount: Large N/A N/A Exudate Type: Serous  N/A N/A Exudate Color: amber N/A N/A Wound Margin: Flat and Intact N/A N/A Granulation Amount: None Present (0%) N/A N/A Necrotic Amount: Large (67-100%) N/A N/A Exposed Structures: Fat Layer (Subcutaneous Tissue): N/A N/A Yes Fascia: No Tendon: No Muscle: No Joint: No Bone: No Epithelialization: None N/A N/A Treatment Notes Electronic Signature(s) Signed: 11/13/2020 4:39:05 PM By: Elliot Gurney, BSN, RN, CWS, [name] RN, BSN Entered By: Elliot Gurney, BSN, RN, CWS, [name] on 11/13/2020 08:35:24 Joellyn Quails ([id number]) -------------------------------------------------------------------------------- Multi-Disciplinary Care Plan Details Patient Name: [name] Hilts A. Date of Service: 11/13/2020 8:15 AM Medical Record Number: [id number] Patient Account Number: [id number] Date of Birth/Sex: 1931/04/11 (86 y.o. M) Treating RN: Huel Coventry Primary Care Mechele Kittleson: [name] Other Clinician: Referring [name] Donath: [name] Treating Johonna Binette/Extender: Rowan Blase in Treatment: 3 Active Inactive Venous Leg Ulcer Nursing Diagnoses: Actual venous Insuffiency (use after diagnosis is confirmed) Potential for venous Insuffiency (use before diagnosis confirmed) Goals: Patient will maintain optimal edema control Date Initiated: 10/17/2020 Target Resolution  Date: 10/31/2020 Goal Status: Active Interventions: Assess peripheral edema status every visit. Treatment Activities: Therapeutic compression applied : 10/17/2020 Notes: Wound/Skin Impairment Nursing Diagnoses: Impaired tissue integrity Goals: Patient/caregiver will verbalize understanding of skin care regimen Date Initiated: 10/17/2020 Target Resolution Date: 11/16/2020 Goal Status: Active Ulcer/skin breakdown will have a volume reduction of 30% by week 4 Date Initiated: 10/17/2020 Target Resolution Date: 12/18/2019 Goal Status: Active Ulcer/skin breakdown will have a volume reduction of 50% by week 8 Date Initiated: 10/17/2020 Target Resolution Date: 01/18/2020 Goal Status: Active Ulcer/skin breakdown will have a volume reduction of 80% by week 12 Date Initiated: 10/17/2020 Target Resolution Date: 02/15/2020 Goal Status: Active Ulcer/skin breakdown will heal within 14 weeks Date Initiated: 10/17/2020 Target Resolution Date: 02/28/2021 Goal Status: Active Interventions: Assess patient/caregiver ability to obtain necessary supplies Assess ulceration(s) every visit Provide education on ulcer and skin care Treatment Activities: Skin care regimen initiated : 10/17/2020 Notes: Electronic Signature(s) Signed: 11/13/2020 4:39:05 PM By: Elliot Gurney, BSN, RN, CWS, [name] RN, BSN [name], [name] ([id number]) Entered By: Elliot Gurney, BSN, RN, CWS, [name] on 11/13/2020 08:35:16 [name] Hilts A. ([id number]) -------------------------------------------------------------------------------- Pain Assessment Details Patient Name: [name] Hilts A. Date of Service: 11/13/2020 8:15 AM Medical Record Number: [id number] Patient Account Number: [id number] Date of Birth/Sex: 08/20/1931 (86 y.o. M) Treating RN: Huel Coventry Primary Care Isabele Lollar: [name] Other Clinician: Referring Tyquez Hollibaugh: [name] Treating Juniel Groene/Extender: Rowan Blase in Treatment: 3 Active Problems Location of Pain  Severity and Description of Pain Patient Has Paino No Site Locations Pain Management and Medication Current Pain Management: Notes Patient denies pain at this time. Electronic Signature(s) Signed: 11/13/2020 4:39:05 PM By: Elliot Gurney, BSN, RN, CWS, [name] RN, BSN Entered By: Elliot Gurney, BSN, RN, CWS, [name] on 11/13/2020 08:22:27 Joellyn Quails ([id number]) -------------------------------------------------------------------------------- Patient/Caregiver Education Details Patient Name: [name] Hilts A. Date of Service: 11/13/2020 8:15 AM Medical Record Number: [id number] Patient Account Number: [id number] Date of Birth/Gender: Feb 04, 1931 (86 y.o. M) Treating RN: Huel Coventry Primary Care Physician: [name] Other Clinician: Referring Physician: [name] Treating Physician/Extender: Rowan Blase in Treatment: 3 Education Assessment Education Provided To: Patient Education Topics Provided Pressure: Handouts: Pressure Ulcers: Care and Offloading Methods: Demonstration, Explain/Verbal Responses: State content correctly Wound/Skin Impairment: Handouts: Caring for Your Ulcer, Other: continue wound care as prscribed Methods: Demonstration Responses: State content correctly Electronic Signature(s) Signed: 11/13/2020 4:39:05 PM By: Elliot Gurney, BSN, RN, CWS, [name] RN, BSN Entered By: Elliot Gurney, BSN, RN, CWS, [name] on 11/13/2020 11:43:46 [name] Hilts A. ([id number]) -------------------------------------------------------------------------------- Wound Assessment Details Patient Name:  [name], [name] Date of Service: 11/13/2020 8:15 AM Medical Record Number: [id number] Patient Account Number: [id number] Date of Birth/Sex: 11-Nov-1931 (86 y.o. M) Treating RN: Huel Coventry Primary Care Nafisah Runions: [name] Other Clinician: Referring Kemper Heupel: [name] Treating Teo Moede/Extender: Allen Derry Weeks in Treatment: 3 Wound Status Wound Number: 8 Primary Venous Leg  Ulcer Etiology: Wound Location: Left, Medial Lower Leg Wound Open Wounding Event: Gradually Appeared Status: Date Acquired: 09/25/2020 Comorbid Cataracts, Sleep Apnea, Congestive Heart Failure, Weeks Of Treatment: 3 History: Coronary Artery Disease, Hypertension, Osteoarthritis Clustered Wound: No Photos Wound Measurements Length: (cm) 1 Width: (cm) 1 Depth: (cm) 0.1 Area: (cm) 0.785 Volume: (cm) 0.079 % Reduction in Area: -11% % Reduction in Volume: -11.3% Epithelialization: None Tunneling: No Undermining: No Wound Description Classification: Full Thickness Without Exposed Support Structu Wound Margin: Flat and Intact Exudate Amount: Large Exudate Type: Serous Exudate Color: amber res Foul Odor After Cleansing: No Slough/Fibrino Yes Wound Bed Granulation Amount: None Present (0%) Exposed Structure Necrotic Amount: Large (67-100%) Fascia Exposed: No Necrotic Quality: Adherent Slough Fat Layer (Subcutaneous Tissue) Exposed: Yes Tendon Exposed: No Muscle Exposed: No Joint Exposed: No Bone Exposed: No Treatment Notes Wound #8 (Left, Medial Lower Leg) Notes Silver cell, gauze, conform secured with tape. Electronic Signature(s) Signed: 11/13/2020 4:39:05 PM By: Elliot Gurney, BSN, RN, CWS, [name] RN, BSN [name], [name] ([id number]) Entered By: Elliot Gurney, BSN, RN, CWS, [name] on 11/13/2020 08:29:35 Joellyn Quails ([id number]) -------------------------------------------------------------------------------- Wound Assessment Details Patient Name: [name] Hilts A. Date of Service: 11/13/2020 8:15 AM Medical Record Number: [id number] Patient Account Number: [id number] Date of Birth/Sex: November 12, 1931 (86 y.o. M) Treating RN: Huel Coventry Primary Care Mj Willis: [name] Other Clinician: Referring Luv Mish: [name] Treating Haralambos Yeatts/Extender: Allen Derry Weeks in Treatment: 3 Wound Status Wound Number: 9 Primary Pressure Ulcer Etiology: Wound Location: Left  Calcaneus Wound Open Wounding Event: Gradually Appeared Status: Date Acquired: 11/10/2020 Comorbid Cataracts, Sleep Apnea, Congestive Heart Failure, Weeks Of Treatment: 0 History: Coronary Artery Disease, Hypertension, Osteoarthritis Clustered Wound: No Wound Measurements Length: (cm) 0.4 Width: (cm) 1.2 Depth: (cm) 0.1 Area: (cm) 0.377 Volume: (cm) 0.038 % Reduction in Area: 0% % Reduction in Volume: 0% Epithelialization: None Tunneling: No Undermining: No Wound Description Classification: Unstageable/Unclassified Exudate Amount: None Present Wound Bed Granulation Amount: None Present (0%) Exposed Structure Necrotic Amount: Large (67-100%) Fascia Exposed: No Necrotic Quality: Eschar Fat Layer (Subcutaneous Tissue) Exposed: No Tendon Exposed: No Muscle Exposed: No Joint Exposed: No Bone Exposed: No Treatment Notes Wound #9 (Left Calcaneus) Notes Silver cell, gauze, conform secured with tape. Electronic Signature(s) Signed: 11/13/2020 4:39:05 PM By: Elliot Gurney, BSN, RN, CWS, [name] RN, BSN Entered By: Elliot Gurney, BSN, RN, CWS, [name] on 11/13/2020 08:39:35 Joellyn Quails ([id number]) -------------------------------------------------------------------------------- Vitals Details Patient Name: [name] Hilts A. Date of Service: 11/13/2020 8:15 AM Medical Record Number: [id number] Patient Account Number: [id number] Date of Birth/Sex: 22-Nov-1931 (86 y.o. M) Treating RN: Huel Coventry Primary Care King Pinzon: [name] Other Clinician: Referring Tidus Upchurch: [name] Treating Enrique Manganaro/Extender: Allen Derry Weeks in Treatment: 3 Vital Signs Time Taken: 08:20 Temperature (F): 97.8 Pulse (bpm): 76 Respiratory Rate (breaths/min): 16 Blood Pressure (mmHg): 116/67 Reference Range: 80 - 120 mg / dl Electronic Signature(s) Signed: 11/13/2020 4:39:32 PM By: Dayton Martes RCP, RRT, CHT Entered By: Dayton Martes on 11/13/2020 08:20:45

## 2020-11-27 ENCOUNTER — Other Ambulatory Visit: Payer: Self-pay

## 2020-11-27 ENCOUNTER — Encounter: Payer: Medicare Other | Attending: Internal Medicine | Admitting: Physician Assistant

## 2020-11-27 DIAGNOSIS — Z66 Do not resuscitate: Secondary | ICD-10-CM | POA: Insufficient documentation

## 2020-11-27 DIAGNOSIS — Z79899 Other long term (current) drug therapy: Secondary | ICD-10-CM | POA: Diagnosis not present

## 2020-11-27 DIAGNOSIS — L97822 Non-pressure chronic ulcer of other part of left lower leg with fat layer exposed: Secondary | ICD-10-CM | POA: Insufficient documentation

## 2020-11-27 DIAGNOSIS — I872 Venous insufficiency (chronic) (peripheral): Secondary | ICD-10-CM | POA: Insufficient documentation

## 2020-11-27 DIAGNOSIS — L97812 Non-pressure chronic ulcer of other part of right lower leg with fat layer exposed: Secondary | ICD-10-CM | POA: Diagnosis not present

## 2020-11-27 DIAGNOSIS — Z8614 Personal history of Methicillin resistant Staphylococcus aureus infection: Secondary | ICD-10-CM | POA: Insufficient documentation

## 2020-11-27 DIAGNOSIS — I2510 Atherosclerotic heart disease of native coronary artery without angina pectoris: Secondary | ICD-10-CM | POA: Diagnosis not present

## 2020-11-27 DIAGNOSIS — Z952 Presence of prosthetic heart valve: Secondary | ICD-10-CM | POA: Diagnosis not present

## 2020-11-27 DIAGNOSIS — I130 Hypertensive heart and chronic kidney disease with heart failure and stage 1 through stage 4 chronic kidney disease, or unspecified chronic kidney disease: Secondary | ICD-10-CM | POA: Diagnosis not present

## 2020-11-27 DIAGNOSIS — Z87891 Personal history of nicotine dependence: Secondary | ICD-10-CM | POA: Insufficient documentation

## 2020-11-27 DIAGNOSIS — Z881 Allergy status to other antibiotic agents status: Secondary | ICD-10-CM | POA: Diagnosis not present

## 2020-11-27 DIAGNOSIS — I4821 Permanent atrial fibrillation: Secondary | ICD-10-CM | POA: Diagnosis not present

## 2020-11-27 DIAGNOSIS — N1832 Chronic kidney disease, stage 3b: Secondary | ICD-10-CM | POA: Diagnosis not present

## 2020-11-27 DIAGNOSIS — L089 Local infection of the skin and subcutaneous tissue, unspecified: Secondary | ICD-10-CM | POA: Insufficient documentation

## 2020-11-27 DIAGNOSIS — I87323 Chronic venous hypertension (idiopathic) with inflammation of bilateral lower extremity: Secondary | ICD-10-CM | POA: Insufficient documentation

## 2020-11-27 DIAGNOSIS — I5042 Chronic combined systolic (congestive) and diastolic (congestive) heart failure: Secondary | ICD-10-CM | POA: Diagnosis not present

## 2020-11-27 DIAGNOSIS — I890 Lymphedema, not elsewhere classified: Secondary | ICD-10-CM | POA: Insufficient documentation

## 2020-11-27 DIAGNOSIS — Z882 Allergy status to sulfonamides status: Secondary | ICD-10-CM | POA: Insufficient documentation

## 2020-11-27 DIAGNOSIS — Z7901 Long term (current) use of anticoagulants: Secondary | ICD-10-CM | POA: Diagnosis not present

## 2020-11-27 DIAGNOSIS — Z950 Presence of cardiac pacemaker: Secondary | ICD-10-CM | POA: Diagnosis not present

## 2020-11-27 NOTE — Progress Notes (Addendum)
[name], [name] ([id number]) Visit Report for 11/27/2020 Chief Complaint Document Details Patient Name: [name], Larone A. Date of Service: 11/27/2020 11:15 AM Medical Record Number: [id number] Patient Account Number: [id number] Date of Birth/Sex: September 10, 1931 (87 y.o. M) Treating RN: Huel CoventryWoody, [name] Primary Care Provider: Marcelino DusterJohnston, John Other Clinician: Referring Provider: Marcelino DusterJohnston, John Treating Provider/Extender: Rowan BlaseStone,  Weeks in Treatment: 5 Information Obtained from: Patient Chief Complaint Left Medical LE Ulcers Electronic Signature(s) Signed: 11/27/2020 11:54:19 AM By: Lenda KelpStone III,  PA-C Entered By: Lenda KelpStone III,  on 11/27/2020 11:54:18 [name], [name] ([id number]) -------------------------------------------------------------------------------- HPI Details Patient Name: [name], [name] Date of Service: 11/27/2020 11:15 AM Medical Record Number: [id number] Patient Account Number: [id number] Date of Birth/Sex: September 10, 1931 (87 y.o. M) Treating RN: Huel CoventryWoody, [name] Primary Care Provider: Marcelino DusterJohnston, John Other Clinician: Referring Provider: Marcelino DusterJohnston, John Treating Provider/Extender: Rowan BlaseStone,  Weeks in Treatment: 5 History of Present Illness HPI Description: 04/21/2019 ADMISSION This is an independent 87 year old man who lives in the independent part of [address]. He has a history of chronic lower extremity edema and wears compression stockings. He states about a month ago he was taking off the one on the right and pulled some skin off accidentally. He has had 2 small wounds on the right anterior and right medial lower extremity. [facility] that he had multiple venous ultrasounds in 2015 and 16. These were DVT rule outs. He did have a Baker's cyst on the left. He has not not had a previous wound history although he did have a history of cellulitis in his legs. Past medical history; includes aortic stenosis status post mechanical AVR in 2007 on chronic  Coumadin, lower extremity edema with a history of cellulitis, history of MRSA. ABIs in our clinic were 1.1 on the right 6/3; patient with predominantly venous insufficiency ulcers in the right lower leg probably some degree of lymphedema. He had to wounds last week. We put him in 3 layer compression. [facility] [place] [facility]. The area laterally is healed but he still has a small very painful area on the anterior tibial area. He is on Coumadin because of mechanical aortic valve. 6/10; venous insufficiency ulcers on the right lower leg. He has some degree of lymphedema. We have been using 3 layer compression with silver collagen small wound. Dimensions are improved. I gave him doxycycline last week which he tolerated because of surrounding erythema. This is improved also 6/17; arrives in clinic today with portable silver collagen dressing tightly adherent to the wound. Also felt that the wrap was too tight 3 [facility]; patient's wound is small still adherent debris over the surface however even under illumination it was hard to see what was still open here. We have been using silver collagen 7/1; the patient arrives in the clinic today and the area on the right lower leg is healed. He has severe chronic venous insufficiency. He has 20/30 compression stockings. Readmission: 06/22/2020 on evaluation today patient presents for reevaluation here in our clinic although it has been a little over a year since we last saw him. He does have a history of lymphedema, venous insufficiency, anticoagulant therapy, he is on a pacemaker which is the reason for the anticoagulants, hypertension, and congestive heart failure. With that being said unfortunately he has been dealing with a wound of the left and right legs which has been giving him some trouble since arrival first 2021. That is in  regard to the left leg ulcer. In regard to the  right leg ulcer this is just a very small area that really I think will seal up quite nicely is more of the lymphedema opening than anything else. With that being said unfortunately the left leg is quite significant. We actually have noted that the patient's been dealing with this for quite some time and I think that there is a chance we may want to consider biopsy. With that being said he is on Coumadin therefore we were not able to perform a biopsy at this point. I think that something however in the future that may be warranted we may just have to take him off of the current Coumadin regimen in order to do this. We discussed doing it today but I am more concerned about the fact that he could have issues with uncontrolled bleeding and in that case we may have to send him to the ER. His daughter was not wanting to take that chance which I can completely understand. Nonetheless so far they have been using antibiotic ointment over the area I am just putting a protective dressing I do believe the patient is that he needs some type of compression. 07/07/2020 on evaluation today patient appears to be doing poorly in regard to his bilateral lower extremities today. He actually went to urgent care last night due to having pain in his legs due to the wraps. It appears they got somewhat tight on him because of the fact that he is having such significant swelling. He appears to be fluid overloaded I do not see any signs of infection actively at this point which is good news but unfortunately I think that this is something that may need to be addressed even by cardiology more so than just with a different or alternate type of wrap. I will contact his cardiologist, Dr. Mariah Milling, as well. 07/10/2020 upon evaluation today patient unfortunately appears to be doing worse even than when I saw him on Friday from the standpoint of where his legs stand. The main issue is that he has increased erythema although there is  still not warm to touch I am concerned about infection and about this getting significantly worse. Again my initial concern was more fluid overload and I discussed this with his cardiology office on Friday. With that being said I feel like this has gotten worse his daughter is in agreement and I think that the best option would be to send him to the hospital at this point. READMISSION 09/06/2020 Is a patient who has had 2 stays in this clinic firstly in 2020 and more recently from 06/02/2020 through 07/10/2020. He was cared for by Allen Derry. When he was last here he had bilateral lower extremity ulcers in the setting of chronic venous insufficiency with lymphedema. When he we last saw him he had weeping edema fluido Cellulitis and he was sent to the hospital. He was admitted initially from 06/30/2020 through 07/14/2020 with cellulitis plus stasis dermatitis. Treated empirically with vancomycin and had increases in his torsemide. He was then sent to the skilled level of [place] but readmitted to hospital from 07/29/2020 through 08/10/2020 with blood loss anemia hemoglobin of 4 and OB positive stools. During this hospitalization he was noted to have a sacral wound at stage II. He was sent to [facility] again in the skilled level. He had compression on his legs with Coban. He is now back at his independent living setting. He  has not been wearing compression and his daughter feels there is already increasing swelling. To have stockings at home not exactly sure at what strength. ENOS, MUHL A. ([id number]) Past medical history includes lymphedema, chronic venous insufficiency, pacemaker, congestive heart failure, hypertension, aortic valve stenosis status post mechanical valve replacement in 2002 on chronic Coumadin, chronic kidney disease stage IIIb, atrial fibrillation, peripheral neuropathy, large hiatal hernia. We did not test his ABIs in the clinic today. Readmission: 10/17/2020  on evaluation today patient appears to be doing somewhat poorly in regard to his left leg and the fact that he does have an open wound at this point. He has not had the compression socks placed at the facility they have not been putting them on at all unfortunately. He does not have on the right leg currently and apparently there is significant put him on the left leg with the wound which I understand is that can be somewhat tight obviously. Nonetheless I do believe the right leg needs to have a compression sock and I do believe on the left side right now we can do something a little better in order to try to get the wound healed at this point. The patient is on torsemide and currently allopurinol for gout. He also has daily weights due to congestive heart failure I am assuming in order to monitor his fluid status overall. He is currently a DNR at the facility. 10/30/2020 upon evaluation today patient appears to be doing somewhat poorly at this time in regard to his bilateral lower extremities. His left leg which is the only place where he had a wound last week when I saw him is actually doing worse with new wound openings. The right leg also has a small wound as well. However the most concerning thing is that last week he did not have any signs of infection or erythema there is no warmth to touch over the leg. This week the entire leg is more sore he also has erythema extending from the ankle to just below the knee with the area being erythematous and warm to touch which I believe represents cellulitis at minimum. I am concerned about how this is spread so quickly and the fact that he is having increased pain and also not feeling as well. All in all I discussed with the patient that unfortunately I feel like he may be best served going to the ER for further evaluation of this issue currently to try to see about the potential for IV antibiotics and to have appropriate lab work performed. His daughter is  present during the visit today and she was included in the decision making at this point. 11/13/2020 upon evaluation today patient appears to be doing well with regard to his leg ulcer. Fortunately he does not appear to be showing signs of infection anything like it was previous. This is great news. He was sent home with IV antibiotic therapy that seems to have done extremely well for him. No fevers, chills, nausea, vomiting, or diarrhea. 11/27/2020 upon evaluation today patient appears to be doing quite well in regard to his wounds currently. Fortunately there is no signs of active infection at this time which is great news and I am very pleased in this regard. He did have some concerns or his daughter did about an area on his gluteal region. With that being said I see nothing that is actually open at this point which is great news I do think continuing with a derma cloud  is a good way to go. With regard to his leg he does have a new area on the right leg of open wound locations which is new compared to last visit we will need to address this as well. Otherwise he seems to be doing quite well in my opinion. Electronic Signature(s) Signed: 11/27/2020 2:02:54 PM By: Lenda Kelp PA-C Entered By: Lenda Kelp on 11/27/2020 14:02:54 [name] ([id number]) -------------------------------------------------------------------------------- Physical Exam Details Patient Name: [name] Hilts A. Date of Service: 11/27/2020 11:15 AM Medical Record Number: [id number] Patient Account Number: [id number] Date of Birth/Sex: 1931/04/05 (87 y.o. M) Treating RN: Huel Coventry Primary Care Provider: [name] Other Clinician: Referring Provider: [name] Treating Provider/Extender: Allen Derry Weeks in Treatment: 5 Constitutional Well-nourished and well-hydrated in no acute distress. Respiratory normal breathing without difficulty. Psychiatric this patient is able to make decisions and  demonstrates good insight into disease process. Alert and Oriented x 3. pleasant and cooperative. Notes Upon inspection patient's wound bed actually showed signs of good granulation at this time. There does not appear to be any evidence of active infection which is great news. Overall I am very pleased with how his legs are doing and I am also happy with the fact that he does not seem to have anything open in the gluteal sacral region at this point I think we need to keep a close eye on this. Electronic Signature(s) Signed: 11/27/2020 2:03:17 PM By: Lenda Kelp PA-C Entered By: Lenda Kelp on 11/27/2020 14:03:16 [name] ([id number]) -------------------------------------------------------------------------------- Physician Orders Details Patient Name: [name] Hilts A. Date of Service: 11/27/2020 11:15 AM Medical Record Number: [id number] Patient Account Number: [id number] Date of Birth/Sex: 1931-06-18 (87 y.o. M) Treating RN: Yevonne Pax Primary Care Provider: [name] Other Clinician: Referring Provider: [name] Treating Provider/Extender: Rowan Blase in Treatment: 5 Verbal / Phone Orders: No Diagnosis Coding ICD-10 Coding Code Description I89.0 Lymphedema, not elsewhere classified I87.2 Venous insufficiency (chronic) (peripheral) L97.822 Non-pressure chronic ulcer of other part of left lower leg with fat layer exposed L97.812 Non-pressure chronic ulcer of other part of right lower leg with fat layer exposed Z79.01 Long term (current) use of anticoagulants Z95.0 Presence of cardiac pacemaker I10 Essential (primary) hypertension I50.42 Chronic combined systolic (congestive) and diastolic (congestive) heart failure I25.10 Atherosclerotic heart disease of native coronary artery without angina pectoris Follow-up Appointments o Return Appointment in 2 weeks. Primary Wound Dressing Wound #10 Right Lower Leg o Alginate with Silver Wound #8  Left,Medial Lower Leg o Alginate with Silver Secondary Dressing Wound #10 Right Lower Leg o Other - roll gauze Wound #8 Left,Medial Lower Leg o Other - roll gauze Dressing Change Frequency Wound #10 Right Lower Leg o Three times weekly - monday wednesday and friday Wound #8 Left,Medial Lower Leg o Three times weekly - monday wednesday and friday Tubi-grip Bilateral Lower Extremities o Single layer _____ - size F Notes derma cloud to sacral area daily Electronic Signature(s) Signed: 11/27/2020 2:13:03 PM By: Lenda Kelp PA-C Signed: 12/01/2020 4:22:48 PM By: Yevonne Pax RN Entered By: Yevonne Pax on 11/27/2020 12:01:36 [name], [name] ([id number]) -------------------------------------------------------------------------------- Problem List Details Patient Name: [name] Hilts A. Date of Service: 11/27/2020 11:15 AM Medical Record Number: [id number] Patient Account Number: [id number] Date of Birth/Sex: 1931/09/23 (87 y.o. M) Treating RN: Huel Coventry Primary Care Provider: [name] Other Clinician: Referring Provider: [name] Treating Provider/Extender: Rowan Blase in Treatment: 5 Active Problems ICD-10 Encounter Code Description Active Date MDM Diagnosis I89.0  Lymphedema, not elsewhere classified 10/17/2020 No Yes I87.2 Venous insufficiency (chronic) (peripheral) 10/17/2020 No Yes L97.822 Non-pressure chronic ulcer of other part of left lower leg with fat layer 10/17/2020 No Yes exposed L97.812 Non-pressure chronic ulcer of other part of right lower leg with fat layer 10/17/2020 No Yes exposed Z79.01 Long term (current) use of anticoagulants 10/17/2020 No Yes Z95.0 Presence of cardiac pacemaker 10/17/2020 No Yes I10 Essential (primary) hypertension 10/17/2020 No Yes I50.42 Chronic combined systolic (congestive) and diastolic (congestive) heart 10/17/2020 No Yes failure I25.10 Atherosclerotic heart disease of native coronary artery without  angina 10/17/2020 No Yes pectoris Inactive Problems Resolved Problems Electronic Signature(s) Signed: 11/27/2020 2:13:03 PM By: Lenda Kelp PA-C Signed: 12/01/2020 4:22:48 PM By: Yevonne Pax RN Previous Signature: 11/27/2020 11:54:01 AM Version By: Lenda Kelp PA-C Entered By: Yevonne Pax on 11/27/2020 11:56:06 [name], [name] ([id number]) [name], [name] ([id number]) -------------------------------------------------------------------------------- Progress Note Details Patient Name: [name] Hilts A. Date of Service: 11/27/2020 11:15 AM Medical Record Number: [id number] Patient Account Number: [id number] Date of Birth/Sex: August 22, 1931 (87 y.o. M) Treating RN: Huel Coventry Primary Care Provider: [name] Other Clinician: Referring Provider: [name] Treating Provider/Extender: Rowan Blase in Treatment: 5 Subjective Chief Complaint Information obtained from Patient Left Medical LE Ulcers History of Present Illness (HPI) 04/21/2019 ADMISSION This is an independent 87 year old man who lives in the independent part of [address] [place]. He has a history of chronic lower extremity edema and wears compression stockings. He states about a month ago he was taking off the one on the right and pulled some skin off accidentally. He has had 2 small wounds on the right anterior and right medial lower extremity. I note looking through [facility] link that he had multiple venous ultrasounds in 2015 and 16. These were DVT rule outs. He did have a Baker's cyst on the left. He has not not had a previous wound history although he did have a history of cellulitis in his legs. Past medical history; includes aortic stenosis status post mechanical AVR in 2007 on chronic Coumadin, lower extremity edema with a history of cellulitis, history of MRSA. ABIs in our clinic were 1.1 on the right 6/3; patient with predominantly venous insufficiency ulcers in the right lower leg probably some  degree of lymphedema. He had to wounds last week. We put him in 3 layer compression. The nurses at [facility] are changing the dressing. The area laterally is healed but he still has a small very painful area on the anterior tibial area. He is on Coumadin because of mechanical aortic valve. 6/10; venous insufficiency ulcers on the right lower leg. He has some degree of lymphedema. We have been using 3 layer compression with silver collagen small wound. Dimensions are improved. I gave him doxycycline last week which he tolerated because of surrounding erythema. This is improved also 6/17; arrives in clinic today with portable silver collagen dressing tightly adherent to the wound. Also felt that the wrap was too tight 3 layer being changed at the [facility] 6/24; patient's wound is small still adherent debris over the surface however even under illumination it was hard to see what was still open here. We have been using silver collagen 7/1; the patient arrives in the clinic today and the area on the right lower leg is healed. He has severe chronic venous insufficiency. He has 20/30 compression stockings. Readmission: 06/22/2020 on evaluation today patient presents for reevaluation here in our clinic although it has been  a little over a year since we last saw him. He does have a history of lymphedema, venous insufficiency, anticoagulant therapy, he is on a pacemaker which is the reason for the anticoagulants, hypertension, and congestive heart failure. With that being said unfortunately he has been dealing with a wound of the left and right legs which has been giving him some trouble since arrival first 2021. That is in regard to the left leg ulcer. In regard to the right leg ulcer this is just a very small area that really I think will seal up quite nicely is more of the lymphedema opening than anything else. With that being said unfortunately the left leg is quite significant. We  actually have noted that the patient's been dealing with this for quite some time and I think that there is a chance we may want to consider biopsy. With that being said he is on Coumadin therefore we were not able to perform a biopsy at this point. I think that something however in the future that may be warranted we may just have to take him off of the current Coumadin regimen in order to do this. We discussed doing it today but I am more concerned about the fact that he could have issues with uncontrolled bleeding and in that case we may have to send him to the ER. His daughter was not wanting to take that chance which I can completely understand. Nonetheless so far they have been using antibiotic ointment over the area I am just putting a protective dressing I do believe the patient is that he needs some type of compression. 07/07/2020 on evaluation today patient appears to be doing poorly in regard to his bilateral lower extremities today. He actually went to urgent care last night due to having pain in his legs due to the wraps. It appears they got somewhat tight on him because of the fact that he is having such significant swelling. He appears to be fluid overloaded I do not see any signs of infection actively at this point which is good news but unfortunately I think that this is something that may need to be addressed even by cardiology more so than just with a different or alternate type of wrap. I will contact his cardiologist, Dr. Mariah MillingGollan, as well. 07/10/2020 upon evaluation today patient unfortunately appears to be doing worse even than when I saw him on Friday from the standpoint of where his legs stand. The main issue is that he has increased erythema although there is still not warm to touch I am concerned about infection and about this getting significantly worse. Again my initial concern was more fluid overload and I discussed this with his cardiology office on Friday. With that being  said I feel like this has gotten worse his daughter is in agreement and I think that the best option would be to send him to the hospital at this point. READMISSION 09/06/2020 Is a patient who has had 2 stays in this clinic firstly in 2020 and more recently from 06/02/2020 through 07/10/2020. He was cared for by Allen DerryHoyt Stone. When he was last here he had bilateral lower extremity ulcers in the setting of chronic venous insufficiency with lymphedema. When he we last saw him he had weeping edema fluido Cellulitis and he was sent to the hospital. He was admitted initially from 06/30/2020 through [name] ([id number]) with cellulitis plus stasis dermatitis. Treated empirically with vancomycin and had increases in his torsemide. He  was then sent to the skilled level of [place] but readmitted to hospital from 07/29/2020 through 08/10/2020 with blood loss anemia hemoglobin of 4 and OB positive stools. During this hospitalization he was noted to have a sacral wound at stage II. He was sent to [facility] [place] again in the skilled level. He had compression on his legs with Coban. He is now back at his independent living setting. He has not been wearing compression and his daughter feels there is already increasing swelling. To have stockings at home not exactly sure at what strength. Past medical history includes lymphedema, chronic venous insufficiency, pacemaker, congestive heart failure, hypertension, aortic valve stenosis status post mechanical valve replacement in 2002 on chronic Coumadin, chronic kidney disease stage IIIb, atrial fibrillation, peripheral neuropathy, large hiatal hernia. We did not test his ABIs in the clinic today. Readmission: 10/17/2020 on evaluation today patient appears to be doing somewhat poorly in regard to his left leg and the fact that he does have an open wound at this point. He has not had the compression socks placed at the facility they have not  been putting them on at all unfortunately. He does not have on the right leg currently and apparently there is significant put him on the left leg with the wound which I understand is that can be somewhat tight obviously. Nonetheless I do believe the right leg needs to have a compression sock and I do believe on the left side right now we can do something a little better in order to try to get the wound healed at this point. The patient is on torsemide and currently allopurinol for gout. He also has daily weights due to congestive heart failure I am assuming in order to monitor his fluid status overall. He is currently a DNR at the facility. 10/30/2020 upon evaluation today patient appears to be doing somewhat poorly at this time in regard to his bilateral lower extremities. His left leg which is the only place where he had a wound last week when I saw him is actually doing worse with new wound openings. The right leg also has a small wound as well. However the most concerning thing is that last week he did not have any signs of infection or erythema there is no warmth to touch over the leg. This week the entire leg is more sore he also has erythema extending from the ankle to just below the knee with the area being erythematous and warm to touch which I believe represents cellulitis at minimum. I am concerned about how this is spread so quickly and the fact that he is having increased pain and also not feeling as well. All in all I discussed with the patient that unfortunately I feel like he may be best served going to the ER for further evaluation of this issue currently to try to see about the potential for IV antibiotics and to have appropriate lab work performed. His daughter is present during the visit today and she was included in the decision making at this point. 11/13/2020 upon evaluation today patient appears to be doing well with regard to his leg ulcer. Fortunately he does not appear to be  showing signs of infection anything like it was previous. This is great news. He was sent home with IV antibiotic therapy that seems to have done extremely well for him. No fevers, chills, nausea, vomiting, or diarrhea. 11/27/2020 upon evaluation today patient appears to be doing quite well in  regard to his wounds currently. Fortunately there is no signs of active infection at this time which is great news and I am very pleased in this regard. He did have some concerns or his daughter did about an area on his gluteal region. With that being said I see nothing that is actually open at this point which is great news I do think continuing with a derma cloud is a good way to go. With regard to his leg he does have a new area on the right leg of open wound locations which is new compared to last visit we will need to address this as well. Otherwise he seems to be doing quite well in my opinion. Objective Constitutional Well-nourished and well-hydrated in no acute distress. Vitals Time Taken: 11:26 AM, Temperature: 97.6 F, Pulse: 71 bpm, Respiratory Rate: 18 breaths/min, Blood Pressure: 147/86 mmHg. Respiratory normal breathing without difficulty. Psychiatric this patient is able to make decisions and demonstrates good insight into disease process. Alert and Oriented x 3. pleasant and cooperative. General Notes: Upon inspection patient's wound bed actually showed signs of good granulation at this time. There does not appear to be any evidence of active infection which is great news. Overall I am very pleased with how his legs are doing and I am also happy with the fact that he does not seem to have anything open in the gluteal sacral region at this point I think we need to keep a close eye on this. Integumentary (Hair, Skin) Wound #10 status is Open. Original cause of wound was Gradually Appeared. The wound is located on the Right Lower Leg. The wound measures 11cm length x 2cm width x 0.1cm depth;  17.279cm^2 area and 1.728cm^3 volume. There is Fat Layer (Subcutaneous Tissue) exposed. There is no tunneling or undermining noted. There is a medium amount of serosanguineous drainage noted. There is small (1-33%) pink granulation within the wound bed. There is a large (67-100%) amount of necrotic tissue within the wound bed including Adherent Slough. Wound #8 status is Open. Original cause of wound was Gradually Appeared. The wound is located on the Left,Medial Lower Leg. The wound measures 0.4cm length x 0.4cm width x 0.1cm depth; 0.126cm^2 area and 0.013cm^3 volume. There is Fat Layer (Subcutaneous Tissue) exposed. There is no tunneling or undermining noted. There is a large amount of serous drainage noted. The wound margin is flat and intact. There is medium (34-66%) pink, pale granulation within the wound bed. There is a medium (34-66%) amount of necrotic tissue within the wound [name], Ira A. ([id number]) bed including Adherent Slough. Wound #9 status is Open. Original cause of wound was Gradually Appeared. The wound is located on the Left Calcaneus. The wound measures 0cm length x 0cm width x 0cm depth; 0cm^2 area and 0cm^3 volume. There is no tunneling or undermining noted. There is a none present amount of drainage noted. There is no granulation within the wound bed. There is a large (67-100%) amount of necrotic tissue within the wound bed including Eschar. 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 Primary  Wound Dressing: Wound #10 Right Lower Leg: Alginate with Silver Wound #8 Left,Medial Lower Leg: Alginate with Silver Secondary Dressing: Wound #10 Right Lower Leg: Other - roll gauze Wound #8 Left,Medial Lower Leg: Other - roll gauze Dressing Change Frequency: Wound #10 Right Lower Leg: Three times weekly - monday wednesday and friday Wound #8 Left,Medial Lower Leg: Three times weekly - monday wednesday and friday Tubi-grip: Single layer _____ - size F General Notes: derma cloud to sacral area daily 1. Would recommend currently that we going to continue with the wound care measures as before the patient is in agreement with that plan. This includes the silver alginate to lower extremities followed by the Tubigrip F which has seemed to have been beneficial for the patient. 2. I am also can recommend that we continue with the derma cloud to the sacral and gluteal region to help protect this area I think that still of utmost importance. 3. Also can recommend continued and appropriate offloading and repositioning to prevent any breakdown in the sacral/gluteal region. We will see patient back for reevaluation in 2 weeks here in the clinic. If anything worsens or changes patient will contact our office for additional recommendations. Electronic Signature(s) Signed: 11/27/2020 2:05:06 PM By: Worthy Keeler PA-C Entered By: Worthy Keeler on 11/27/2020 14:05:05 [name] Bridge A. ([id number]) -------------------------------------------------------------------------------- SuperBill Details Patient Name: [name] Bridge A. Date of Service: 11/27/2020 Medical Record Number: [id number] Patient Account Number: [id number] Date of Birth/Sex: [birth year] (87 y.o. M) Treating RN: Carlene Coria Primary Care Provider: Harrel Lemon Other Clinician: Referring Provider: Harrel Lemon Treating Provider/Extender: Skipper Cliche in Treatment: 5 Diagnosis Coding ICD-10 Codes Code Description I89.0 Lymphedema, not elsewhere classified I87.2 Venous insufficiency (chronic) (peripheral) L97.822 Non-pressure chronic ulcer of other part of left lower leg with fat layer exposed L97.812 Non-pressure chronic ulcer of other part of right lower leg with fat layer exposed Z79.01 Long term (current) use of anticoagulants Z95.0 Presence of cardiac pacemaker I10 Essential (primary) hypertension I50.42 Chronic combined systolic (congestive) and diastolic (congestive) heart failure I25.10 Atherosclerotic heart disease of native coronary artery without angina pectoris Facility Procedures CPT4 Code: [id number] Description: 99214 - WOUND CARE VISIT-LEV 4 EST PT Modifier: Quantity: 1 Physician Procedures CPT4 Code: [id number] Description: 99213 - WC PHYS LEVEL 3 - EST PT Modifier: Quantity: 1 CPT4 Code: Description: ICD-10 Diagnosis Description I89.0 Lymphedema, not elsewhere classified I87.2 Venous insufficiency (chronic) (peripheral) L97.822 Non-pressure chronic ulcer of other part of left lower leg with fat lay L97.812 Non-pressure chronic ulcer of  other part of right lower leg with fat la Modifier: er exposed yer exposed Quantity: Electronic Signature(s) Signed: 11/27/2020 2:05:58 PM By: Worthy Keeler PA-C Entered By: Worthy Keeler on 11/27/2020 14:05:57

## 2020-11-30 ENCOUNTER — Other Ambulatory Visit
Admission: RE | Admit: 2020-11-30 | Discharge: 2020-11-30 | Disposition: A | Discharge status: Home / Self Care | Payer: Medicare Other | Source: Ambulatory Visit | Attending: Internal Medicine | Admitting: Internal Medicine

## 2020-11-30 DIAGNOSIS — Z7901 Long term (current) use of anticoagulants: Secondary | ICD-10-CM | POA: Diagnosis not present

## 2020-11-30 DIAGNOSIS — I4891 Unspecified atrial fibrillation: Secondary | ICD-10-CM | POA: Diagnosis present

## 2020-11-30 LAB — PROTIME-INR
INR: 2.8 — ABNORMAL HIGH (ref 0.8–1.2)
Prothrombin Time: 28.8 seconds — ABNORMAL HIGH (ref 11.4–15.2)

## 2020-12-01 NOTE — Progress Notes (Signed)
TYRE, BEAVER ([id number]) Visit Report for 11/27/2020 Arrival Information Details Patient Name: [name], [name] Date of Service: 11/27/2020 11:15 AM Medical Record Number: [id number] Patient Account Number: [id number] Date of Birth/Sex: [birth year] (87 y.o. M) Treating RN: Yevonne Pax Primary Care Harriet Sutphen: [name] Other Clinician: Referring Verneal Wiers: [name] Treating Tiffiany Beadles/Extender: Rowan Blase in Treatment: 5 Visit Information History Since Last Visit All ordered tests and consults were completed: No Patient Arrived: Wheel Chair Added or deleted any medications: No Arrival Time: 11:26 Any new allergies or adverse reactions: No Accompanied By: self Had a fall or experienced change in No Transfer Assistance: None activities of daily living that may affect Patient Identification Verified: Yes risk of falls: Secondary Verification Process Completed: Yes Signs or symptoms of abuse/neglect since last visito No Patient Requires Transmission-Based Precautions: No Hospitalized since last visit: No Patient Has Alerts: No Implantable device outside of the clinic excluding No cellular tissue based products placed in the center since last visit: Has Dressing in Place as Prescribed: Yes Pain Present Now: No Electronic Signature(s) Signed: 12/01/2020 4:22:48 PM By: Yevonne Pax RN Entered By: Yevonne Pax on 11/27/2020 11:26:43 Imler, [name] ([id number]) -------------------------------------------------------------------------------- Clinic Level of Care Assessment Details Patient Name: [name] A. Date of Service: 11/27/2020 11:15 AM Medical Record Number: [id number] Patient Account Number: [id number] Date of Birth/Sex: 1931-08-19 (87 y.o. M) Treating RN: Yevonne Pax Primary Care Chinita Schimpf: [name] Other Clinician: Referring Alesi Zachery: [name] Treating Yehoshua Vitelli/Extender: Rowan Blase in Treatment: 5 Clinic Level of Care Assessment  Items TOOL 4 Quantity Score X - Use when only an EandM is performed on FOLLOW-UP visit 1 0 ASSESSMENTS - Nursing Assessment / Reassessment X - Reassessment of Co-morbidities (includes updates in patient status) 1 10 X- 1 5 Reassessment of Adherence to Treatment Plan ASSESSMENTS - Wound and Skin Assessment / Reassessment []  - Simple Wound Assessment / Reassessment - one wound 0 X- 2 5 Complex Wound Assessment / Reassessment - multiple wounds []  - 0 Dermatologic / Skin Assessment (not related to wound area) ASSESSMENTS - Focused Assessment []  - Circumferential Edema Measurements - multi extremities 0 []  - 0 Nutritional Assessment / Counseling / Intervention []  - 0 Lower Extremity Assessment (monofilament, tuning fork, pulses) []  - 0 Peripheral Arterial Disease Assessment (using hand held doppler) ASSESSMENTS - Ostomy and/or Continence Assessment and Care []  - Incontinence Assessment and Management 0 []  - 0 Ostomy Care Assessment and Management (repouching, etc.) PROCESS - Coordination of Care X - Simple Patient / Family Education for ongoing care 1 15 []  - 0 Complex (extensive) Patient / Family Education for ongoing care X- 1 10 Staff obtains , Records, Test Results / Process Orders []  - 0 Staff telephones HHA, Nursing Homes / Clarify orders / etc []  - 0 Routine Transfer to another Facility (non-emergent condition) []  - 0 Routine Hospital Admission (non-emergent condition) []  - 0 New Admissions / / Ordering NPWT, Apligraf, etc. []  - 0 Emergency Hospital Admission (emergent condition) X- 1 10 Simple Discharge Coordination []  - 0 Complex (extensive) Discharge Coordination PROCESS - Special Needs []  - Pediatric / Minor Patient Management 0 []  - 0 Isolation Patient Management []  - 0 Hearing / Language / Visual special needs []  - 0 Assessment of Community assistance (transportation, D/C planning, etc.) []  - 0 Additional assistance /  Altered mentation []  - 0 Support Surface(s) Assessment (bed, cushion, seat, etc.) INTERVENTIONS - Wound Cleansing / Measurement [name], [name] ( ) []  - 0 Simple Wound Cleansing -  one wound X- 2 5 Complex Wound Cleansing - multiple wounds X- 1 5 Wound Imaging (photographs - any number of wounds) []  - 0 Wound Tracing (instead of photographs) []  - 0 Simple Wound Measurement - one wound X- 2 5 Complex Wound Measurement - multiple wounds INTERVENTIONS - Wound Dressings []  - Small Wound Dressing one or multiple wounds 0 X- 2 15 Medium Wound Dressing one or multiple wounds []  - 0 Large Wound Dressing one or multiple wounds X- 1 5 Application of Medications - topical []  - 0 Application of Medications - injection INTERVENTIONS - Miscellaneous []  - External ear exam 0 []  - 0 Specimen Collection (cultures, biopsies, blood, body fluids, etc.) []  - 0 Specimen(s) / Culture(s) sent or taken to Lab for analysis []  - 0 Patient Transfer (multiple staff / / Similar devices) []  - 0 Simple Staple / Suture removal (25 or less) []  - 0 Complex Staple / Suture removal (26 or more) []  - 0 Hypo / Hyperglycemic Management (close monitor of Blood Glucose) []  - 0 Ankle / Brachial Index (ABI) - do not check if billed separately []  - 0 Vital Signs Has the patient been seen at the hospital within the last three years: Yes Total Score: 120 Level Of Care: New/Established - Level 4 Electronic Signature(s) Signed: 12/01/2020 4:22:48 PM By: RN Entered By: on 11/27/2020 12:02:26 A. ( ) -------------------------------------------------------------------------------- Encounter Discharge Information Details Patient Name: A. Date of Service: 11/27/2020 11:15 AM Medical Record Number: Nurse, adult Patient Account Number: Date of Birth/Sex: [birth year] (87 y.o. M) Treating RN: Primary Care Quang Thorpe:  Other Clinician: Referring [name]: 01/29/2021 Treating Zettie Gootee/Extender: Yevonne Pax in Treatment: 5 Encounter Discharge Information Items Discharge Condition: Stable Ambulatory Status: Wheelchair Discharge Destination: Home Transportation: Private Auto Accompanied By: self Schedule Follow-up Appointment: Yes Clinical Summary of Care: Patient Declined Electronic Signature(s) Signed: 12/01/2020 4:22:48 PM By: 01/25/2021 RN Entered By: [name] on 11/27/2020 12:17:28 [name] A. (01/25/2021) -------------------------------------------------------------------------------- Lower Extremity Assessment Details Patient Name: [id number] A. Date of Service: 11/27/2020 11:15 AM Medical Record Number: 09/14/1931 Patient Account Number: [id number] Date of Birth/Sex: 1931-10-27 (87 y.o. M) Treating RN: [name] Primary Care Cleotha Whalin: Rowan Blase Other Clinician: Referring Lasha Echeverria: 01/29/2021 Treating Rahkeem Senft/Extender: Yevonne Pax Weeks in Treatment: 5 Edema Assessment Assessed: [Left: No] [Right: No] [Left: Edema] [Right: :] Calf Left: Right: Point of Measurement: From Medial Instep 38 cm Ankle Left: Right: Point of Measurement: From Medial Instep 23 cm Electronic Signature(s) Signed: 12/01/2020 4:22:48 PM By: 01/25/2021 RN Entered By: [name] on 11/27/2020 11:49:23 [name], [name] ([name]) -------------------------------------------------------------------------------- Multi Wound Chart Details Patient Name: 01/25/2021 A. Date of Service: 11/27/2020 11:15 AM Medical Record Number: [id number] Patient Account Number: 09/14/1931 Date of Birth/Sex: 26-Sep-1931 (87 y.o. M) Treating RN: [name] Primary Care Zakkery Dorian: [name] Other Clinician: Referring Savanna Dooley: Allen Derry Treating Hayli Milligan/Extender: 01/29/2021 in Treatment: 5 Vital Signs Height(in): Pulse(bpm): 71 Weight(lbs): Blood Pressure(mmHg):  147/86 Body Mass Index(BMI): Temperature(F): 97.6 Respiratory Rate(breaths/min): 18 Photos: [10:No Photos] [8:No Photos] [9:No Photos] Wound Location: [10:Right Lower Leg] [8:Left, Medial Lower Leg] [9:Left Calcaneus] Wounding Event: [10:Gradually Appeared] [8:Gradually Appeared] [9:Gradually Appeared] Primary Etiology: [10:Venous Leg Ulcer] [8:Venous Leg Ulcer] [9:Pressure Ulcer] Comorbid History: [10:Cataracts, Sleep Apnea, Congestive Heart Failure, Coronary Artery Disease, Hypertension, Osteoarthritis] [8:Cataracts, Sleep Apnea, Congestive Heart Failure, Coronary Artery Disease, Hypertension, Osteoarthritis] [9:Cataracts, Sleep  Apnea, Congestive Heart Failure, Coronary Artery Disease, Hypertension, Osteoarthritis]  Date Acquired: [10:10/25/2020] [8:09/25/2020] [9:11/10/2020] Weeks of Treatment: [10:0] [8:5] [9:2] Wound Status: [10:Open] [8:Open] [9:Open] Measurements L x W x D (cm) [10:11x2x0.1] [8:0.4x0.4x0.1] [9:0x0x0] Area (cm) : [10:17.279] [8:0.126] [9:0] Volume (cm) : [10:1.728] [8:0.013] [9:0] % Reduction in Area: [10:0.00%] [8:82.20%] [9:100.00%] % Reduction in Volume: [10:0.00%] [8:81.70%] [9:100.00%] Classification: [10:Full Thickness Without Exposed Support Structures] [8:Full Thickness Without Exposed Support Structures] [9:Unstageable/Unclassified] Exudate Amount: [10:Medium] [8:Large] [9:None Present] Exudate Type: [10:Serosanguineous] [8:Serous] [9:N/A] Exudate Color: [10:red, brown] [8:amber] [9:N/A] Wound Margin: [10:N/A] [8:Flat and Intact] [9:N/A] Granulation Amount: [10:Small (1-33%)] [8:Medium (34-66%)] [9:None Present (0%)] Granulation Quality: [10:Pink] [8:Pink, Pale] [9:N/A] Necrotic Amount: [10:Large (67-100%)] [8:Medium (34-66%)] [9:Large (67-100%)] Necrotic Tissue: [10:Adherent Slough] [8:Adherent Slough] [9:Eschar] Exposed Structures: [10:Fat Layer (Subcutaneous Tissue): Yes Fascia: No Tendon: No Muscle: No Joint: No Bone: No None] [8:Fat Layer  (Subcutaneous Tissue): Yes Fascia: No Tendon: No Muscle: No Joint: No Bone: No None] [9:Fascia: No Fat Layer (Subcutaneous Tissue): No Tendon:  No Muscle: No Joint: No Bone: No Large (67-100%)] Treatment Notes Electronic Signature(s) Signed: 12/01/2020 4:22:48 PM By: Yevonne Pax RN Entered By: Yevonne Pax on 11/27/2020 11:57:08 [name] A. ([id number]) -------------------------------------------------------------------------------- Multi-Disciplinary Care Plan Details Patient Name: [name] A. Date of Service: 11/27/2020 11:15 AM Medical Record Number: [id number] Patient Account Number: [id number] Date of Birth/Sex: [birth year] (87 y.o. M) Treating RN: Yevonne Pax Primary Care Demetruis Depaul: [name] Other Clinician: Referring Carlous Olivares: [name] Treating Lavanya Roa/Extender: Rowan Blase in Treatment: 5 Active Inactive Venous Leg Ulcer Nursing Diagnoses: Actual venous Insuffiency (use after diagnosis is confirmed) Potential for venous Insuffiency (use before diagnosis confirmed) Goals: Patient will maintain optimal edema control Date Initiated: 10/17/2020 Target Resolution Date: 10/31/2020 Goal Status: Active Interventions: Assess peripheral edema status every visit. Treatment Activities: Therapeutic compression applied : 10/17/2020 Notes: Wound/Skin Impairment Nursing Diagnoses: Impaired tissue integrity Goals: Patient/caregiver will verbalize understanding of skin care regimen Date Initiated: 10/17/2020 Target Resolution Date: 11/16/2020 Goal Status: Active Ulcer/skin breakdown will have a volume reduction of 30% by week 4 Date Initiated: 10/17/2020 Target Resolution Date: 12/18/2019 Goal Status: Active Ulcer/skin breakdown will have a volume reduction of 50% by week 8 Date Initiated: 10/17/2020 Target Resolution Date: 01/18/2020 Goal Status: Active Ulcer/skin breakdown will have a volume reduction of 80% by week 12 Date Initiated: 10/17/2020 Target  Resolution Date: 02/15/2020 Goal Status: Active Ulcer/skin breakdown will heal within 14 weeks Date Initiated: 10/17/2020 Target Resolution Date: 02/28/2021 Goal Status: Active Interventions: Assess patient/caregiver ability to obtain necessary supplies Assess ulceration(s) every visit Provide education on ulcer and skin care Treatment Activities: Skin care regimen initiated : 10/17/2020 Notes: Electronic Signature(s) Signed: 12/01/2020 4:22:48 PM By: Yevonne Pax RN [name], [name] ([id number]) Entered By: Yevonne Pax on 11/27/2020 11:56:46 [name], [name] ([id number]) -------------------------------------------------------------------------------- Pain Assessment Details Patient Name: [name] A. Date of Service: 11/27/2020 11:15 AM Medical Record Number: [id number] Patient Account Number: [id number] Date of Birth/Sex: Mar 26, 1931 (87 y.o. M) Treating RN: Yevonne Pax Primary Care Rayder Sullenger: [name] Other Clinician: Referring Blakleigh Straw: [name] Treating Majel Giel/Extender: Rowan Blase in Treatment: 5 Active Problems Location of Pain Severity and Description of Pain Patient Has Paino No Site Locations Pain Management and Medication Current Pain Management: Electronic Signature(s) Signed: 12/01/2020 4:22:48 PM By: Yevonne Pax RN Entered By: Yevonne Pax on 11/27/2020 11:27:25 [name] A. ([id number]) -------------------------------------------------------------------------------- Patient/Caregiver Education Details Patient Name: [name] A. Date of Service: 11/27/2020 11:15 AM Medical Record Number: [id number] Patient Account Number: [id number] Date of Birth/Gender: 06-20-1931 (87 y.o. M) Treating RN: Yevonne Pax Primary  Care Physician: [name] Other Clinician: Referring Physician: [name] Treating Physician/Extender: Rowan Blase in Treatment: 5 Education Assessment Education Provided To: Patient Education Topics  Provided Wound/Skin Impairment: Methods: Explain/Verbal Responses: State content correctly Electronic Signature(s) Signed: 12/01/2020 4:22:48 PM By: Yevonne Pax RN Entered By: Yevonne Pax on 11/27/2020 12:02:47 [name] A. ([id number]) -------------------------------------------------------------------------------- Wound Assessment Details Patient Name: [name] A. Date of Service: 11/27/2020 11:15 AM Medical Record Number: [id number] Patient Account Number: [id number] Date of Birth/Sex: 1931/10/19 (87 y.o. M) Treating RN: Yevonne Pax Primary Care Gabreille Dardis: [name] Other Clinician: Referring Sharisse Rantz: [name] Treating Hadden Steig/Extender: Allen Derry Weeks in Treatment: 5 Wound Status Wound Number: 10 Primary Venous Leg Ulcer Etiology: Wound Location: Right Lower Leg Wound Open Wounding Event: Gradually Appeared Status: Date Acquired: 10/25/2020 Comorbid Cataracts, Sleep Apnea, Congestive Heart Failure, Weeks Of Treatment: 0 History: Coronary Artery Disease, Hypertension, Osteoarthritis Clustered Wound: No Wound Measurements Length: (cm) 11 Width: (cm) 2 Depth: (cm) 0.1 Area: (cm) 17.279 Volume: (cm) 1.728 % Reduction in Area: 0% % Reduction in Volume: 0% Epithelialization: None Tunneling: No Undermining: No Wound Description Classification: Full Thickness Without Exposed Support Struct Exudate Amount: Medium Exudate Type: Serosanguineous Exudate Color: red, brown ures Foul Odor After Cleansing: No Slough/Fibrino Yes Wound Bed Granulation Amount: Small (1-33%) Exposed Structure Granulation Quality: Pink Fascia Exposed: No Necrotic Amount: Large (67-100%) Fat Layer (Subcutaneous Tissue) Exposed: Yes Necrotic Quality: Adherent Slough Tendon Exposed: No Muscle Exposed: No Joint Exposed: No Bone Exposed: No Treatment Notes Wound #10 (Right Lower Leg) 1. Cleansed with: Clean wound with Normal Saline Cleanse wound with antibacterial soap  and water 4. Dressing Applied: Calcium Alginate with Silver 5. Secondary Dressing Applied Kerlix/Conform Notes tubi grip size F Electronic Signature(s) Signed: 12/01/2020 4:22:48 PM By: Yevonne Pax RN Entered By: Yevonne Pax on 11/27/2020 11:55:26 [name] A. ([id number]) -------------------------------------------------------------------------------- Wound Assessment Details Patient Name: [name] A. Date of Service: 11/27/2020 11:15 AM Medical Record Number: [id number] Patient Account Number: [id number] Date of Birth/Sex: 1931-10-18 (87 y.o. M) Treating RN: Yevonne Pax Primary Care Ima Hafner: [name] Other Clinician: Referring Lenea Bywater: [name] Treating Navia Lindahl/Extender: Allen Derry Weeks in Treatment: 5 Wound Status Wound Number: 8 Primary Venous Leg Ulcer Etiology: Wound Location: Left, Medial Lower Leg Wound Open Wounding Event: Gradually Appeared Status: Date Acquired: 09/25/2020 Comorbid Cataracts, Sleep Apnea, Congestive Heart Failure, Weeks Of Treatment: 5 History: Coronary Artery Disease, Hypertension, Osteoarthritis Clustered Wound: No Wound Measurements Length: (cm) 0.4 Width: (cm) 0.4 Depth: (cm) 0.1 Area: (cm) 0.126 Volume: (cm) 0.013 % Reduction in Area: 82.2% % Reduction in Volume: 81.7% Epithelialization: None Tunneling: No Undermining: No Wound Description Classification: Full Thickness Without Exposed Support Struct Wound Margin: Flat and Intact Exudate Amount: Large Exudate Type: Serous Exudate Color: amber ures Foul Odor After Cleansing: No Slough/Fibrino Yes Wound Bed Granulation Amount: Medium (34-66%) Exposed Structure Granulation Quality: Pink, Pale Fascia Exposed: No Necrotic Amount: Medium (34-66%) Fat Layer (Subcutaneous Tissue) Exposed: Yes Necrotic Quality: Adherent Slough Tendon Exposed: No Muscle Exposed: No Joint Exposed: No Bone Exposed: No Treatment Notes Wound #8 (Left, Medial Lower Leg) 1.  Cleansed with: Clean wound with Normal Saline Cleanse wound with antibacterial soap and water 4. Dressing Applied: Calcium Alginate with Silver 5. Secondary Dressing Applied Kerlix/Conform Notes tubi grip size F Electronic Signature(s) Signed: 12/01/2020 4:22:48 PM By: Yevonne Pax RN Entered By: Yevonne Pax on 11/27/2020 11:43:30 [name] A. ([id number]) -------------------------------------------------------------------------------- Wound Assessment Details Patient Name: [name] A. Date of Service: 11/27/2020 11:15 AM Medical Record Number:  [id number] Patient Account Number: [id number] Date of Birth/Sex: 12/29/1930 (87 y.o. M) Treating RN: Carlene Coria Primary Care Kendyl Bissonnette: Harrel Lemon Other Clinician: Referring Izzy Doubek: Harrel Lemon Treating Skyllar Notarianni/Extender: Jeri Cos Weeks in Treatment: 5 Wound Status Wound Number: 9 Primary Pressure Ulcer Etiology: Wound Location: Left Calcaneus Wound Open Wounding Event: Gradually Appeared Status: Date Acquired: 11/10/2020 Comorbid Cataracts, Sleep Apnea, Congestive Heart Failure, Weeks Of Treatment: 2 History: Coronary Artery Disease, Hypertension, Osteoarthritis Clustered Wound: No Wound Measurements Length: (cm) 0 Width: (cm) 0 Depth: (cm) 0 Area: (cm) 0 Volume: (cm) 0 % Reduction in Area: 100% % Reduction in Volume: 100% Epithelialization: Large (67-100%) Tunneling: No Undermining: No Wound Description Classification: Unstageable/Unclassified Exudate Amount: None Present Wound Bed Granulation Amount: None Present (0%) Exposed Structure Necrotic Amount: Large (67-100%) Fascia Exposed: No Necrotic Quality: Eschar Fat Layer (Subcutaneous Tissue) Exposed: No Tendon Exposed: No Muscle Exposed: No Joint Exposed: No Bone Exposed: No Electronic Signature(s) Signed: 12/01/2020 4:22:48 PM By: Carlene Coria RN Entered By: Carlene Coria on 11/27/2020 11:45:11 [place], [place].  ([id number]) -------------------------------------------------------------------------------- Vitals Details Patient Name: [name] Bridge A. Date of Service: 11/27/2020 11:15 AM Medical Record Number: [id number] Patient Account Number: [id number] Date of Birth/Sex: [birth year] (87 y.o. M) Treating RN: Carlene Coria Primary Care Diahn Waidelich: Harrel Lemon Other Clinician: Referring Janesha Brissette: Harrel Lemon Treating Marializ Ferrebee/Extender: Jeri Cos Weeks in Treatment: 5 Vital Signs Time Taken: 11:26 Temperature (F): 97.6 Pulse (bpm): 71 Respiratory Rate (breaths/min): 18 Blood Pressure (mmHg): 147/86 Reference Range: 80 - 120 mg / dl Electronic Signature(s) Signed: 12/01/2020 4:22:48 PM By: Carlene Coria RN Entered By: Carlene Coria on 11/27/2020 11:27:17

## 2020-12-04 ENCOUNTER — Encounter
Admission: RE | Admit: 2020-12-04 | Discharge: 2020-12-04 | Disposition: A | Discharge status: Home / Self Care | Payer: Medicare Other | Source: Ambulatory Visit | Attending: Internal Medicine | Admitting: Internal Medicine

## 2020-12-07 ENCOUNTER — Other Ambulatory Visit
Admission: RE | Admit: 2020-12-07 | Discharge: 2020-12-07 | Disposition: A | Discharge status: Home / Self Care | Payer: Medicare Other | Source: Ambulatory Visit | Attending: Internal Medicine | Admitting: Internal Medicine

## 2020-12-07 DIAGNOSIS — I4891 Unspecified atrial fibrillation: Secondary | ICD-10-CM | POA: Insufficient documentation

## 2020-12-07 LAB — PROTIME-INR
INR: 3.4 — ABNORMAL HIGH (ref 0.8–1.2)
Prothrombin Time: 32.9 seconds — ABNORMAL HIGH (ref 11.4–15.2)

## 2020-12-11 ENCOUNTER — Ambulatory Visit: Payer: Medicare Other | Admitting: Physician Assistant

## 2020-12-15 ENCOUNTER — Other Ambulatory Visit: Payer: Self-pay

## 2020-12-15 ENCOUNTER — Encounter: Payer: Medicare Other | Admitting: Physician Assistant

## 2020-12-15 DIAGNOSIS — I87323 Chronic venous hypertension (idiopathic) with inflammation of bilateral lower extremity: Secondary | ICD-10-CM | POA: Diagnosis not present

## 2020-12-15 NOTE — Progress Notes (Addendum)
SABAN, HEINLEN ([id number]) Visit Report for 12/15/2020 Chief Complaint Document Details Patient Name: [name], [name] Date of Service: 12/15/2020 10:00 AM Medical Record Number: [id number] Patient Account Number: [id number] Date of Birth/Sex: 03-22-1931 (87 y.o. M) Treating RN: Huel Coventry Primary Care Provider: [name] Other Clinician: Referring Provider: [name] Treating Provider/Extender: Rowan Blase in Treatment: 8 Information Obtained from: Patient Chief Complaint Left Medical LE Ulcers Electronic Signature(s) Signed: 12/15/2020 10:28:33 AM By: Lenda Kelp PA-C Entered By: Lenda Kelp on 12/15/2020 10:28:33 Joellyn Quails ([id number]) -------------------------------------------------------------------------------- HPI Details Patient Name: [name] A. Date of Service: 12/15/2020 10:00 AM Medical Record Number: [id number] Patient Account Number: [id number] Date of Birth/Sex: 13-Jan-1931 (87 y.o. M) Treating RN: Huel Coventry Primary Care Provider: [name] Other Clinician: Referring Provider: [name] Treating Provider/Extender: Rowan Blase in Treatment: 8 History of Present Illness HPI Description: 04/21/2019 ADMISSION This is an independent 87 year old man who lives in the independent part of [address] of [place]. He has a history of chronic lower extremity edema and wears compression stockings. He states about a month ago he was taking off the one on the right and pulled some skin off accidentally. He has had 2 small wounds on the right anterior and right medial lower extremity. I note looking through [facility] health link that he had multiple venous ultrasounds in 2015 and 16. These were DVT rule outs. He did have a Baker's cyst on the left. He has not not had a previous wound history although he did have a history of cellulitis in his legs. Past medical history; includes aortic stenosis status post mechanical AVR in 2007 on  chronic Coumadin, lower extremity edema with a history of cellulitis, history of MRSA. ABIs in our clinic were 1.1 on the right 6/3; patient with predominantly venous insufficiency ulcers in the right lower leg probably some degree of lymphedema. He had to wounds last week. We put him in 3 layer compression. The nurses at [facility] are changing the dressing. The area laterally is healed but he still has a small very painful area on the anterior tibial area. He is on Coumadin because of mechanical aortic valve. 6/10; venous insufficiency ulcers on the right lower leg. He has some degree of lymphedema. We have been using 3 layer compression with silver collagen small wound. Dimensions are improved. I gave him doxycycline last week which he tolerated because of surrounding erythema. This is improved also 6/17; arrives in clinic today with portable silver collagen dressing tightly adherent to the wound. Also felt that the wrap was too tight 3 layer being changed at the [facility] 6/24; patient's wound is small still adherent debris over the surface however even under illumination it was hard to see what was still open here. We have been using silver collagen 7/1; the patient arrives in the clinic today and the area on the right lower leg is healed. He has severe chronic venous insufficiency. He has 20/30 compression stockings. Readmission: 06/22/2020 on evaluation today patient presents for reevaluation here in our clinic although it has been a little over a year since we last saw him. He does have a history of lymphedema, venous insufficiency, anticoagulant therapy, he is on a pacemaker which is the reason for the anticoagulants, hypertension, and congestive heart failure. With that being said unfortunately he has been dealing with a wound of the left and right legs which has been giving him some trouble since arrival first 2021. That is in  regard to the left leg ulcer. In regard  to the right leg ulcer this is just a very small area that really I think will seal up quite nicely is more of the lymphedema opening than anything else. With that being said unfortunately the left leg is quite significant. We actually have noted that the patient's been dealing with this for quite some time and I think that there is a chance we may want to consider biopsy. With that being said he is on Coumadin therefore we were not able to perform a biopsy at this point. I think that something however in the future that may be warranted we may just have to take him off of the current Coumadin regimen in order to do this. We discussed doing it today but I am more concerned about the fact that he could have issues with uncontrolled bleeding and in that case we may have to send him to the ER. His daughter was not wanting to take that chance which I can completely understand. Nonetheless so far they have been using antibiotic ointment over the area I am just putting a protective dressing I do believe the patient is that he needs some type of compression. 07/07/2020 on evaluation today patient appears to be doing poorly in regard to his bilateral lower extremities today. He actually went to urgent care last night due to having pain in his legs due to the wraps. It appears they got somewhat tight on him because of the fact that he is having such significant swelling. He appears to be fluid overloaded I do not see any signs of infection actively at this point which is good news but unfortunately I think that this is something that may need to be addressed even by cardiology more so than just with a different or alternate type of wrap. I will contact his cardiologist, Dr. Mariah Milling, as well. 07/10/2020 upon evaluation today patient unfortunately appears to be doing worse even than when I saw him on Friday from the standpoint of where his legs stand. The main issue is that he has increased erythema although there  is still not warm to touch I am concerned about infection and about this getting significantly worse. Again my initial concern was more fluid overload and I discussed this with his cardiology office on Friday. With that being said I feel like this has gotten worse his daughter is in agreement and I think that the best option would be to send him to the hospital at this point. READMISSION 09/06/2020 Is a patient who has had 2 stays in this clinic firstly in 2020 and more recently from 06/02/2020 through 07/10/2020. He was cared for by Allen Derry. When he was last here he had bilateral lower extremity ulcers in the setting of chronic venous insufficiency with lymphedema. When he we last saw him he had weeping edema fluido Cellulitis and he was sent to the hospital. He was admitted initially from 06/30/2020 through 07/14/2020 with cellulitis plus stasis dermatitis. Treated empirically with vancomycin and had increases in his torsemide. He was then sent to the skilled level of [facility] [place] but readmitted to hospital from 07/29/2020 through 08/10/2020 with blood loss anemia hemoglobin of 4 and OB positive stools. During this hospitalization he was noted to have a sacral wound at stage II. He was sent to [facility] [place] again in the skilled level. He had compression on his legs with Coban. He is now back at his independent living setting. He  has not been wearing compression and his daughter feels there is already increasing swelling. To have stockings at home not exactly sure at what strength. DIMITRIUS, [name] ([id number]) Past medical history includes lymphedema, chronic venous insufficiency, pacemaker, congestive heart failure, hypertension, aortic valve stenosis status post mechanical valve replacement in 2002 on chronic Coumadin, chronic kidney disease stage IIIb, atrial fibrillation, peripheral neuropathy, large hiatal hernia. We did not test his ABIs in the clinic  today. Readmission: 10/17/2020 on evaluation today patient appears to be doing somewhat poorly in regard to his left leg and the fact that he does have an open wound at this point. He has not had the compression socks placed at the facility they have not been putting them on at all unfortunately. He does not have on the right leg currently and apparently there is significant put him on the left leg with the wound which I understand is that can be somewhat tight obviously. Nonetheless I do believe the right leg needs to have a compression sock and I do believe on the left side right now we can do something a little better in order to try to get the wound healed at this point. The patient is on torsemide and currently allopurinol for gout. He also has daily weights due to congestive heart failure I am assuming in order to monitor his fluid status overall. He is currently a DNR at the facility. 10/30/2020 upon evaluation today patient appears to be doing somewhat poorly at this time in regard to his bilateral lower extremities. His left leg which is the only place where he had a wound last week when I saw him is actually doing worse with new wound openings. The right leg also has a small wound as well. However the most concerning thing is that last week he did not have any signs of infection or erythema there is no warmth to touch over the leg. This week the entire leg is more sore he also has erythema extending from the ankle to just below the knee with the area being erythematous and warm to touch which I believe represents cellulitis at minimum. I am concerned about how this is spread so quickly and the fact that he is having increased pain and also not feeling as well. All in all I discussed with the patient that unfortunately I feel like he may be best served going to the ER for further evaluation of this issue currently to try to see about the potential for IV antibiotics and to have  appropriate lab work performed. His daughter is present during the visit today and she was included in the decision making at this point. 11/13/2020 upon evaluation today patient appears to be doing well with regard to his leg ulcer. Fortunately he does not appear to be showing signs of infection anything like it was previous. This is great news. He was sent home with IV antibiotic therapy that seems to have done extremely well for him. No fevers, chills, nausea, vomiting, or diarrhea. 11/27/2020 upon evaluation today patient appears to be doing quite well in regard to his wounds currently. Fortunately there is no signs of active infection at this time which is great news and I am very pleased in this regard. He did have some concerns or his daughter did about an area on his gluteal region. With that being said I see nothing that is actually open at this point which is great news I do think continuing with a derma cloud  is a good way to go. With regard to his leg he does have a new area on the right leg of open wound locations which is new compared to last visit we will need to address this as well. Otherwise he seems to be doing quite well in my opinion. 12/15/2020 upon evaluation today patient appears to be doing well with regard to his leg ulcer on the left. With that being said he did not have the Tubigrip on which I think is integral to getting this closed as he continues to weep. Subsequently I think the Tubigrip will help control some of the edema which will help him in that regard. Fortunately there is no signs of active infection at this time. No fevers, chills, nausea, vomiting, or diarrhea. Electronic Signature(s) Signed: 12/15/2020 10:47:49 AM By: Lenda Kelp PA-C Entered By: Lenda Kelp on 12/15/2020 10:47:49 Joellyn Quails ([id number]) -------------------------------------------------------------------------------- Physical Exam Details Patient Name: [name] A. Date of  Service: 12/15/2020 10:00 AM Medical Record Number: [id number] Patient Account Number: [id number] Date of Birth/Sex: 1931-11-01 (87 y.o. M) Treating RN: Huel Coventry Primary Care Provider: [name] Other Clinician: Referring Provider: [name] Treating Provider/Extender: Allen Derry Weeks in Treatment: 8 Constitutional Well-nourished and well-hydrated in no acute distress. Respiratory normal breathing without difficulty. Psychiatric this patient is able to make decisions and demonstrates good insight into disease process. Alert and Oriented x 3. pleasant and cooperative. Notes Upon inspection patient's wound bed actually showed signs of good granulation at this point. There does not appear to be any evidence of active infection which is great news and overall I am extremely pleased with where things stand today. No fevers, chills, nausea, vomiting, or diarrhea. Electronic Signature(s) Signed: 12/15/2020 10:48:07 AM By: Lenda Kelp PA-C Entered By: Lenda Kelp on 12/15/2020 10:48:07 Joellyn Quails ([id number]) -------------------------------------------------------------------------------- Physician Orders Details Patient Name: [name] A. Date of Service: 12/15/2020 10:00 AM Medical Record Number: [id number] Patient Account Number: [id number] Date of Birth/Sex: 05-07-1931 (87 y.o. M) Treating RN: Rogers Blocker Primary Care Provider: [name] Other Clinician: Referring Provider: [name] Treating Provider/Extender: Rowan Blase in Treatment: 8 Verbal / Phone Orders: No Diagnosis Coding ICD-10 Coding Code Description I89.0 Lymphedema, not elsewhere classified I87.2 Venous insufficiency (chronic) (peripheral) L97.822 Non-pressure chronic ulcer of other part of left lower leg with fat layer exposed L97.812 Non-pressure chronic ulcer of other part of right lower leg with fat layer exposed Z79.01 Long term (current) use of anticoagulants Z95.0  Presence of cardiac pacemaker I10 Essential (primary) hypertension I50.42 Chronic combined systolic (congestive) and diastolic (congestive) heart failure I25.10 Atherosclerotic heart disease of native coronary artery without angina pectoris Follow-up Appointments o Return Appointment in 2 weeks. Edema Control - Lymphedema / Segmental Compressive Device / Other Wound #8 Left,Medial Lower Leg o Patient to wear own compression stockings. - own compression stocking on right extremity, tubi grip on left extremity at all times for compression Non-Wound Condition Right Lower Extremity o Apply appropriate compression. Wound Treatment Wound #8 - Lower Leg Wound Laterality: Left, Medial Cleanser: Normal Saline (Generic) 3 x Per Week/30 Days Discharge Instructions: Wash your hands with soap and water. Remove old dressing, discard into plastic bag and place into trash. Cleanse the wound with Normal Saline prior to applying a clean dressing using gauze sponges, not tissues or cotton balls. Do not scrub or use excessive force. Pat dry using gauze sponges, not tissue or cotton balls. Primary Dressing: Silvercel Small 2x2 (in/in) (Generic) 3 x  Per Week/30 Days Discharge Instructions: Apply Silvercel Small 2x2 (in/in) as instructed Secondary Dressing: Gauze 3 x Per Week/30 Days Discharge Instructions: As directed Secured With: Kerlix Roll Sterile or Non-Sterile 6-ply 4.5x4 (yd/yd) (Generic) 3 x Per Week/30 Days Discharge Instructions: Apply Kerlix as directed Secured With: Tubigrip Size F, 4x10 (in/yd) (Generic) 3 x Per Week/30 Days Discharge Instructions: Apply Tubigrip F 3 finger-widths below knee to base of toes to secure dressing and/or for swelling. Electronic Signature(s) Signed: 12/15/2020 10:57:08 AM By: Phillis Haggis, Dondra Prader RN Signed: 12/15/2020 4:53:43 PM By: Lenda Kelp PA-C Entered By: Phillis Haggis, Dondra Prader on 12/15/2020 10:36:44 [name] A.  ([id number]) -------------------------------------------------------------------------------- Problem List Details Patient Name: [name] A. Date of Service: 12/15/2020 10:00 AM Medical Record Number: [id number] Patient Account Number: [id number] Date of Birth/Sex: 1931-10-05 (87 y.o. M) Treating RN: Huel Coventry Primary Care Provider: [name] Other Clinician: Referring Provider: [name] Treating Provider/Extender: Rowan Blase in Treatment: 8 Active Problems ICD-10 Encounter Code Description Active Date MDM Diagnosis I89.0 Lymphedema, not elsewhere classified 10/17/2020 No Yes I87.2 Venous insufficiency (chronic) (peripheral) 10/17/2020 No Yes L97.822 Non-pressure chronic ulcer of other part of left lower leg with fat layer 10/17/2020 No Yes exposed L97.812 Non-pressure chronic ulcer of other part of right lower leg with fat layer 10/17/2020 No Yes exposed Z79.01 Long term (current) use of anticoagulants 10/17/2020 No Yes Z95.0 Presence of cardiac pacemaker 10/17/2020 No Yes I10 Essential (primary) hypertension 10/17/2020 No Yes I50.42 Chronic combined systolic (congestive) and diastolic (congestive) heart 10/17/2020 No Yes failure I25.10 Atherosclerotic heart disease of native coronary artery without angina 10/17/2020 No Yes pectoris Inactive Problems Resolved Problems Electronic Signature(s) Signed: 12/15/2020 10:28:27 AM By: Lenda Kelp PA-C Entered By: Lenda Kelp on 12/15/2020 10:28:27 [name] A. ([id number]) -------------------------------------------------------------------------------- Progress Note Details Patient Name: [name] A. Date of Service: 12/15/2020 10:00 AM Medical Record Number: [id number] Patient Account Number: [id number] Date of Birth/Sex: 11/10/1931 (87 y.o. M) Treating RN: Huel Coventry Primary Care Provider: [name] Other Clinician: Referring Provider: [name] Treating Provider/Extender: Rowan Blase in Treatment: 8 Subjective Chief Complaint Information obtained from Patient Left Medical LE Ulcers History of Present Illness (HPI) 04/21/2019 ADMISSION This is an independent 87 year old man who lives in the independent part of [address] [place]. He has a history of chronic lower extremity edema and wears compression stockings. He states about a month ago he was taking off the one on the right and pulled some skin off accidentally. He has had 2 small wounds on the right anterior and right medial lower extremity. I note looking through [facility] health link that he had multiple venous ultrasounds in 2015 and 16. These were DVT rule outs. He did have a Baker's cyst on the left. He has not not had a previous wound history although he did have a history of cellulitis in his legs. Past medical history; includes aortic stenosis status post mechanical AVR in 2007 on chronic Coumadin, lower extremity edema with a history of cellulitis, history of MRSA. ABIs in our clinic were 1.1 on the right 6/3; patient with predominantly venous insufficiency ulcers in the right lower leg probably some degree of lymphedema. He had to wounds last week. We put him in 3 layer compression. The nurses at [facility] are changing the dressing. The area laterally is healed but he still has a small very painful area on the anterior tibial area. He is on Coumadin because of mechanical aortic valve. 6/10; venous insufficiency ulcers  on the right lower leg. He has some degree of lymphedema. We have been using 3 layer compression with silver collagen small wound. Dimensions are improved. I gave him doxycycline last week which he tolerated because of surrounding erythema. This is improved also 6/17; arrives in clinic today with portable silver collagen dressing tightly adherent to the wound. Also felt that the wrap was too tight [facility]; patient's wound is small  still adherent debris over the surface however even under illumination it was hard to see what was still open here. We have been using silver collagen 7/1; the patient arrives in the clinic today and the area on the right lower leg is healed. He has severe chronic venous insufficiency. He has 20/30 compression stockings. Readmission: 06/22/2020 on evaluation today patient presents for reevaluation here in our clinic although it has been a little over a year since we last saw him. He does have a history of lymphedema, venous insufficiency, anticoagulant therapy, he is on a pacemaker which is the reason for the anticoagulants, hypertension, and congestive heart failure. With that being said unfortunately he has been dealing with a wound of the left and right legs which has been giving him some trouble since arrival first 2021. That is in regard to the left leg ulcer. In regard to the right leg ulcer this is just a very small area that really I think will seal up quite nicely is more of the lymphedema opening than anything else. With that being said unfortunately the left leg is quite significant. We actually have noted that the patient's been dealing with this for quite some time and I think that there is a chance we may want to consider biopsy. With that being said he is on Coumadin therefore we were not able to perform a biopsy at this point. I think that something however in the future that may be warranted we may just have to take him off of the current Coumadin regimen in order to do this. We discussed doing it today but I am more concerned about the fact that he could have issues with uncontrolled bleeding and in that case we may have to send him to the ER. His daughter was not wanting to take that chance which I can completely understand. Nonetheless so far they have been using antibiotic ointment over the area I am just putting a protective dressing I do believe the patient is that he needs some  type of compression. 07/07/2020 on evaluation today patient appears to be doing poorly in regard to his bilateral lower extremities today. He actually went to urgent care last night due to having pain in his legs due to the wraps. It appears they got somewhat tight on him because of the fact that he is having such significant swelling. He appears to be fluid overloaded I do not see any signs of infection actively at this point which is good news but unfortunately I think that this is something that may need to be addressed even by cardiology more so than just with a different or alternate type of wrap. I will contact his cardiologist, Dr. Mariah MillingGollan, as well. 07/10/2020 upon evaluation today patient unfortunately appears to be doing worse even than when I saw him on Friday from the standpoint of where his legs stand. The main issue is that he has increased erythema although there is still not warm to touch I am concerned about infection and about this  getting significantly worse. Again my initial concern was more fluid overload and I discussed this with his cardiology office on Friday. With that being said I feel like this has gotten worse his daughter is in agreement and I think that the best option would be to send him to the hospital at this point. READMISSION 09/06/2020 Is a patient who has had 2 stays in this clinic firstly in 2020 and more recently from 06/02/2020 through 07/10/2020. He was cared for by Allen Derry. When he was last here he had bilateral lower extremity ulcers in the setting of chronic venous insufficiency with lymphedema. When he we last saw him he had weeping edema fluido Cellulitis and he was sent to the hospital. He was admitted initially from 06/30/2020 through 07/14/2020 [facility] ([id number]) with cellulitis plus stasis dermatitis. Treated empirically with vancomycin and had increases in his torsemide. He was then sent to the skilled level of [place] but  readmitted to hospital from 07/29/2020 through 08/10/2020 with blood loss anemia hemoglobin of 4 and OB positive stools. During this hospitalization he was noted to have a sacral wound at stage II. He was sent to [facility] [place] again in the skilled level. He had compression on his legs with Coban. He is now back at his independent living setting. He has not been wearing compression and his daughter feels there is already increasing swelling. To have stockings at home not exactly sure at what strength. Past medical history includes lymphedema, chronic venous insufficiency, pacemaker, congestive heart failure, hypertension, aortic valve stenosis status post mechanical valve replacement in 2002 on chronic Coumadin, chronic kidney disease stage IIIb, atrial fibrillation, peripheral neuropathy, large hiatal hernia. We did not test his ABIs in the clinic today. Readmission: 10/17/2020 on evaluation today patient appears to be doing somewhat poorly in regard to his left leg and the fact that he does have an open wound at this point. He has not had the compression socks placed at the facility they have not been putting them on at all unfortunately. He does not have on the right leg currently and apparently there is significant put him on the left leg with the wound which I understand is that can be somewhat tight obviously. Nonetheless I do believe the right leg needs to have a compression sock and I do believe on the left side right now we can do something a little better in order to try to get the wound healed at this point. The patient is on torsemide and currently allopurinol for gout. He also has daily weights due to congestive heart failure I am assuming in order to monitor his fluid status overall. He is currently a DNR at the facility. 10/30/2020 upon evaluation today patient appears to be doing somewhat poorly at this time in regard to his bilateral lower extremities. His left leg which is the  only place where he had a wound last week when I saw him is actually doing worse with new wound openings. The right leg also has a small wound as well. However the most concerning thing is that last week he did not have any signs of infection or erythema there is no warmth to touch over the leg. This week the entire leg is more sore he also has erythema extending from the ankle to just below the knee with the area being erythematous and warm to touch which I believe represents cellulitis at minimum. I am concerned about how this is spread so quickly  and the fact that he is having increased pain and also not feeling as well. All in all I discussed with the patient that unfortunately I feel like he may be best served going to the ER for further evaluation of this issue currently to try to see about the potential for IV antibiotics and to have appropriate lab work performed. His daughter is present during the visit today and she was included in the decision making at this point. 11/13/2020 upon evaluation today patient appears to be doing well with regard to his leg ulcer. Fortunately he does not appear to be showing signs of infection anything like it was previous. This is great news. He was sent home with IV antibiotic therapy that seems to have done extremely well for him. No fevers, chills, nausea, vomiting, or diarrhea. 11/27/2020 upon evaluation today patient appears to be doing quite well in regard to his wounds currently. Fortunately there is no signs of active infection at this time which is great news and I am very pleased in this regard. He did have some concerns or his daughter did about an area on his gluteal region. With that being said I see nothing that is actually open at this point which is great news I do think continuing with a derma cloud is a good way to go. With regard to his leg he does have a new area on the right leg of open wound locations which is new compared to last visit we  will need to address this as well. Otherwise he seems to be doing quite well in my opinion. 12/15/2020 upon evaluation today patient appears to be doing well with regard to his leg ulcer on the left. With that being said he did not have the Tubigrip on which I think is integral to getting this closed as he continues to weep. Subsequently I think the Tubigrip will help control some of the edema which will help him in that regard. Fortunately there is no signs of active infection at this time. No fevers, chills, nausea, vomiting, or diarrhea. Objective Constitutional Well-nourished and well-hydrated in no acute distress. Vitals Time Taken: 10:11 AM, Temperature: 97.6 F, Pulse: 69 bpm, Respiratory Rate: 16 breaths/min, Blood Pressure: 122/77 mmHg. Respiratory normal breathing without difficulty. Psychiatric this patient is able to make decisions and demonstrates good insight into disease process. Alert and Oriented x 3. pleasant and cooperative. General Notes: Upon inspection patient's wound bed actually showed signs of good granulation at this point. There does not appear to be any evidence of active infection which is great news and overall I am extremely pleased with where things stand today. No fevers, chills, nausea, vomiting, or diarrhea. Integumentary (Hair, Skin) Wound #10 status is Healed - Epithelialized. Original cause of wound was Gradually Appeared. The wound is located on the Right Lower Leg. The wound measures 0cm length x 0cm width x 0cm depth; 0cm^2 area and 0cm^3 volume. Wound #8 status is Open. Original cause of wound was Gradually Appeared. The wound is located on the Left,Medial Lower Leg. The wound Terhaar, Quamaine A. ([id number]) measures 0.2cm length x 0.3cm width x 0.1cm depth; 0.047cm^2 area and 0.005cm^3 volume. There is Fat Layer (Subcutaneous Tissue) exposed. There is no tunneling or undermining noted. There is a small amount of serous drainage noted. The wound margin is  flat and intact. There is medium (34-66%) pink, pale granulation within the wound bed. There is no necrotic tissue within the wound bed. 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 Edema Control - Lymphedema / Segmental Compressive Device / Other: Wound #8 Left,Medial Lower Leg: Patient to wear own compression stockings. - own compression stocking on right extremity, tubi grip on left extremity at all times for compression Non-Wound Condition: Apply appropriate compression. WOUND #8: - Lower Leg Wound Laterality: Left, Medial Cleanser: Normal Saline (Generic) 3 x Per Week/30 Days Discharge Instructions: Wash your hands with soap and water. Remove old dressing, discard into plastic bag and place into trash. Cleanse the wound with Normal Saline prior to applying a clean dressing using gauze sponges, not tissues or cotton balls. Do not scrub or use excessive force. Pat dry using gauze sponges, not tissue or cotton balls. Primary Dressing: Silvercel Small 2x2 (in/in) (Generic) 3 x Per Week/30 Days Discharge Instructions: Apply Silvercel Small 2x2 (in/in) as instructed Secondary Dressing: Gauze 3 x Per Week/30 Days Discharge Instructions: As directed Secured With: Kerlix Roll Sterile or Non-Sterile 6-ply 4.5x4 (yd/yd) (Generic) 3 x Per Week/30 Days Discharge Instructions: Apply Kerlix as directed Secured With: Tubigrip Size F, 4x10  (in/yd) (Generic) 3 x Per Week/30 Days Discharge Instructions: Apply Tubigrip F 3 finger-widths below knee to base of toes to secure dressing and/or for swelling. 1. I am going to suggest currently that we go ahead and continue with the wound care measures as before and the patient is in agreement with the plan this includes the use of the silver alginate dressing followed by roll gauze just wrapped around 2-3 times to secure in place secured with tape. The Tubigrip with the me placed over top of this he should have the Tubigrip on at all times and made a very specific note to send back to the facility in this regard. This was also placed on the printed orders. He needs to have the Tubigrip always on. 2. Also can recommend the patient have his feet elevated as much as he can and again I do believe to the dementia side of things this is not exactly the easiest thing to do. We will see patient back for reevaluation in 1 week here in the clinic. If anything worsens or changes patient will contact our office for additional recommendations. Electronic Signature(s) Signed: 12/15/2020 10:50:04 AM By: Lenda KelpStone III, Ronnald Shedden PA-C Entered By: Lenda KelpStone III, Murray Durrell on 12/15/2020 10:50:04 [name] HiltsSCHENK, Jeriko AMarland Kitchen. ([id number]) -------------------------------------------------------------------------------- SuperBill Details Patient Name: [name] HiltsSCHENK, [name] Date of Service: 12/15/2020 Medical Record Number: [id number] Patient Account Number: [id number] Date of Birth/Sex: 13-Jun-1931 [age] y.o. M) Treating RN: Rogers BlockerSanchez, Kenia Primary Care Provider: Marcelino DusterJohnston, John Other Clinician: Referring Provider: Marcelino DusterJohnston, John Treating Provider/Extender: Rowan BlaseStone, Keairra Bardon Weeks in Treatment: 8 Diagnosis Coding ICD-10 Codes Code Description I89.0 Lymphedema, not elsewhere classified I87.2 Venous insufficiency (chronic) (peripheral) L97.822 Non-pressure chronic ulcer of other part of left lower leg with fat layer exposed L97.812 Non-pressure chronic  ulcer of other part of right lower leg with fat layer exposed Z79.01 Long term (current) use of anticoagulants Z95.0 Presence of cardiac pacemaker I10 Essential (primary) hypertension I50.42 Chronic combined systolic (congestive) and diastolic (congestive) heart failure I25.10 Atherosclerotic heart disease of native coronary artery  without angina pectoris Facility Procedures CPT4 Code: [id number] Description: 99213 - WOUND CARE VISIT-LEV 3 EST PT Modifier: Quantity: 1 Physician Procedures CPT4 Code: [id number] Description: 99213 - WC PHYS LEVEL 3 - EST PT Modifier: Quantity: 1 CPT4 Code: Description: ICD-10 Diagnosis Description I89.0 Lymphedema, not elsewhere classified I87.2 Venous insufficiency (chronic) (peripheral) L97.822 Non-pressure chronic ulcer of other part of left lower leg with fat lay L97.812 Non-pressure chronic ulcer of  other part of right lower leg with fat la Modifier: er exposed yer exposed Quantity: Electronic Signature(s) Signed: 12/15/2020 10:50:19 AM By: Lenda Kelp PA-C Entered By: Lenda Kelp on 12/15/2020 10:50:19

## 2020-12-15 NOTE — Progress Notes (Signed)
HOMER, MILLER ([id number]) Visit Report for 12/15/2020 Arrival Information Details Patient Name: URAL, ACREE A. Date of Service: 12/15/2020 10:00 AM Medical Record Number: [id number] Patient Account Number: [id number] Date of Birth/Sex: 10-Apr-1931 (87 y.o. M) Treating RN: Rogers Blocker Primary Care Skylah Delauter: [name] Other Clinician: Referring Trulee Hamstra: [name] Treating Orlando Devereux/Extender: Rowan Blase in Treatment: 8 Visit Information History Since Last Visit Pain Present Now: No Patient Arrived: Wheel Chair Arrival Time: 10:09 Accompanied By: daughter Transfer Assistance: None Patient Requires Transmission-Based Precautions: No Patient Has Alerts: No Electronic Signature(s) Signed: 12/15/2020 10:57:08 AM By: Phillis Haggis, Dondra Prader RN Entered By: Phillis Haggis, Dondra Prader on 12/15/2020 10:10:30 Joellyn Quails ([id number]) -------------------------------------------------------------------------------- Clinic Level of Care Assessment Details Patient Name: [name] Date of Service: 12/15/2020 10:00 AM Medical Record Number: [id number] Patient Account Number: [id number] Date of Birth/Sex: [birth year] (87 y.o. M) Treating RN: Rogers Blocker Primary Care Brixton Schnapp: [name] Other Clinician: Referring Deicy Rusk: [name] Treating Jeimy Bickert/Extender: Rowan Blase in Treatment: 8 Clinic Level of Care Assessment Items TOOL 4 Quantity Score X - Use when only an EandM is performed on FOLLOW-UP visit 1 0 ASSESSMENTS - Nursing Assessment / Reassessment X - Reassessment of Co-morbidities (includes updates in patient status) 1 10 X- 1 5 Reassessment of Adherence to Treatment Plan ASSESSMENTS - Wound and Skin Assessment / Reassessment X - Simple Wound Assessment / Reassessment - one wound 1 5 []  - 0 Complex Wound Assessment / Reassessment - multiple wounds []  - 0 Dermatologic / Skin Assessment (not related to wound area) ASSESSMENTS - Focused  Assessment []  - Circumferential Edema Measurements - multi extremities 0 []  - 0 Nutritional Assessment / Counseling / Intervention []  - 0 Lower Extremity Assessment (monofilament, tuning fork, pulses) []  - 0 Peripheral Arterial Disease Assessment (using hand held doppler) ASSESSMENTS - Ostomy and/or Continence Assessment and Care []  - Incontinence Assessment and Management 0 []  - 0 Ostomy Care Assessment and Management (repouching, etc.) PROCESS - Coordination of Care X - Simple Patient / Family Education for ongoing care 1 15 []  - 0 Complex (extensive) Patient / Family Education for ongoing care []  - 0 Staff obtains , Records, Test Results / Process Orders []  - 0 Staff telephones HHA, Nursing Homes / Clarify orders / etc []  - 0 Routine Transfer to another Facility (non-emergent condition) []  - 0 Routine Hospital Admission (non-emergent condition) []  - 0 New Admissions / / Ordering NPWT, Apligraf, etc. []  - 0 Emergency Hospital Admission (emergent condition) X- 1 10 Simple Discharge Coordination []  - 0 Complex (extensive) Discharge Coordination PROCESS - Special Needs []  - Pediatric / Minor Patient Management 0 []  - 0 Isolation Patient Management []  - 0 Hearing / Language / Visual special needs []  - 0 Assessment of Community assistance (transportation, D/C planning, etc.) []  - 0 Additional assistance / Altered mentation []  - 0 Support Surface(s) Assessment (bed, cushion, seat, etc.) INTERVENTIONS - Wound Cleansing / Measurement Zellner, [place] A. ( ) X- 1 5 Simple Wound Cleansing - one wound []  - 0 Complex Wound Cleansing - multiple wounds X- 1 5 Wound Imaging (photographs - any number of wounds) []  - 0 Wound Tracing (instead of photographs) X- 1 5 Simple Wound Measurement - one wound []  - 0 Complex Wound Measurement - multiple wounds INTERVENTIONS - Wound Dressings []  - Small Wound Dressing one or multiple wounds  0 X- 1 15 Medium Wound Dressing one or multiple wounds []  - 0 Large Wound Dressing one or multiple wounds []  - 0  Application of Medications - topical []  - 0 Application of Medications - injection INTERVENTIONS - Miscellaneous []  - External ear exam 0 []  - 0 Specimen Collection (cultures, biopsies, blood, body fluids, etc.) []  - 0 Specimen(s) / Culture(s) sent or taken to Lab for analysis []  - 0 Patient Transfer (multiple staff / / Similar devices) []  - 0 Simple Staple / Suture removal (25 or less) []  - 0 Complex Staple / Suture removal (26 or more) []  - 0 Hypo / Hyperglycemic Management (close monitor of Blood Glucose) []  - 0 Ankle / Brachial Index (ABI) - do not check if billed separately X- 1 5 Vital Signs Has the patient been seen at the hospital within the last three years: Yes Total Score: 80 Level Of Care: New/Established - Level 3 Electronic Signature(s) Signed: 12/15/2020 10:57:08 AM By: , RN Entered By: , Kenia on 12/15/2020 10:46:44 A. ( ) -------------------------------------------------------------------------------- Encounter Discharge Information Details Patient Name: A. Date of Service: 12/15/2020 10:00 AM Medical Record Number: 12/17/2020 Patient Account Number: Phillis Haggis Date of Birth/Sex: [birth year] (87 y.o. M) Treating RN: 12/17/2020 Primary Care Maleeyah Mccaughey: [name] Other Clinician: Referring Matheson Vandehei: [phone] Treating Kadien Lineman/Extender: [name] in Treatment: 8 Encounter Discharge Information Items Discharge Condition: Stable Ambulatory Status: Wheelchair Discharge Destination: Home Transportation: Private Auto Accompanied By: daughter Schedule Follow-up Appointment: Yes Clinical Summary of Care: Electronic Signature(s) Signed: 12/15/2020 10:57:08 AM By: [id number], [id number] RN Entered By: 09/14/1931, 06-11-1977 on 12/15/2020  10:47:43 [name] A. ([name]) -------------------------------------------------------------------------------- Lower Extremity Assessment Details Patient Name: Rowan Blase A. Date of Service: 12/15/2020 10:00 AM Medical Record Number: Phillis Haggis Patient Account Number: Dondra Prader Date of Birth/Sex: 09-08-1931 (87 y.o. M) Treating RN: 12/17/2020 Primary Care Jaicee Michelotti: [name] Other Clinician: Referring Ahuva Poynor: [id number] Treating Shamyah Stantz/Extender: [name] Weeks in Treatment: 8 Edema Assessment Assessed: [Left: Yes] [Right: Yes] Edema: [Left: No] [Right: No] Calf Left: Right: Point of Measurement: 33 cm From Medial Instep 35.5 cm 33.5 cm Ankle Left: Right: Point of Measurement: 10 cm From Medial Instep 21.5 cm 22.5 cm Vascular Assessment Pulses: Dorsalis Pedis Palpable: [Left:Yes] [Right:Yes] Electronic Signature(s) Signed: 12/15/2020 10:57:08 AM By: [id number], [id number] RN Entered By: 09/14/1931, [birth year]1979 on 12/15/2020 10:27:33 [name] A. ([name]) -------------------------------------------------------------------------------- Multi Wound Chart Details Patient Name: Allen Derry A. Date of Service: 12/15/2020 10:00 AM Medical Record Number: Phillis Haggis Patient Account Number: Dondra Prader Date of Birth/Sex: 1931/07/09 (87 y.o. M) Treating RN: 12/17/2020 Primary Care Arleene Settle: [name] Other Clinician: Referring Cayleigh Paull: [phone] Treating Leaner Morici/Extender: [name] in Treatment: 8 Vital Signs Height(in): Pulse(bpm): 69 Weight(lbs): Blood Pressure(mmHg): 122/77 Body Mass Index(BMI): Temperature(F): 97.6 Respiratory Rate(breaths/min): 16 Photos: [10:No Photos] [N/A:N/A] Wound Location: Right Lower Leg Left, Medial Lower Leg N/A Wounding Event: Gradually Appeared Gradually Appeared N/A Primary Etiology: Venous Leg Ulcer Venous Leg Ulcer N/A Comorbid History: N/A Cataracts, Sleep Apnea, Congestive  N/A Heart Failure, Coronary Artery Disease, Hypertension, Osteoarthritis Date Acquired: 10/25/2020 09/25/2020 N/A Weeks of Treatment: 2 8 N/A Wound Status: Healed - Epithelialized Open N/A Measurements L x W x D (cm) 0x0x0 0.2x0.3x0.1 N/A Area (cm) : 0 0.047 N/A Volume (cm) : 0 0.005 N/A % Reduction in Area: 100.00% 93.40% N/A % Reduction in Volume: 100.00% 93.00% N/A Classification: Full Thickness Without Exposed Full Thickness Without Exposed N/A Support Structures Support Structures Exudate Amount: N/A Small N/A Exudate Type: N/A Serous N/A Exudate Color: N/A amber N/A Wound Margin: N/A Flat and Intact N/A Granulation  Amount: N/A Medium (34-66%) N/A Granulation Quality: N/A Pink, Pale N/A Necrotic Amount: N/A None Present (0%) N/A Epithelialization: N/A Medium (34-66%) N/A Treatment Notes Electronic Signature(s) Signed: 12/15/2020 10:57:08 AM By: Phillis Haggis, Dondra Prader RN Entered By: Phillis Haggis, Dondra Prader on 12/15/2020 10:29:39 Joellyn Quails ([id number]) -------------------------------------------------------------------------------- Multi-Disciplinary Care Plan Details Patient Name: [name] Date of Service: 12/15/2020 10:00 AM Medical Record Number: [id number] Patient Account Number: [id number] Date of Birth/Sex: Jan 01, 1931 (87 y.o. M) Treating RN: Rogers Blocker Primary Care Jatasia Gundrum: [name] Other Clinician: Referring Phuong Moffatt: [name] Treating Nola Botkins/Extender: Rowan Blase in Treatment: 8 Active Inactive Venous Leg Ulcer Nursing Diagnoses: Actual venous Insuffiency (use after diagnosis is confirmed) Potential for venous Insuffiency (use before diagnosis confirmed) Goals: Patient will maintain optimal edema control Date Initiated: 10/17/2020 Target Resolution Date: 10/31/2020 Goal Status: Active Interventions: Assess peripheral edema status every visit. Treatment Activities: Therapeutic compression applied :  10/17/2020 Notes: Wound/Skin Impairment Nursing Diagnoses: Impaired tissue integrity Goals: Patient/caregiver will verbalize understanding of skin care regimen Date Initiated: 10/17/2020 Target Resolution Date: 11/16/2020 Goal Status: Active Ulcer/skin breakdown will have a volume reduction of 30% by week 4 Date Initiated: 10/17/2020 Target Resolution Date: 12/18/2019 Goal Status: Active Ulcer/skin breakdown will have a volume reduction of 50% by week 8 Date Initiated: 10/17/2020 Target Resolution Date: 01/18/2020 Goal Status: Active Ulcer/skin breakdown will have a volume reduction of 80% by week 12 Date Initiated: 10/17/2020 Target Resolution Date: 02/15/2020 Goal Status: Active Ulcer/skin breakdown will heal within 14 weeks Date Initiated: 10/17/2020 Target Resolution Date: 02/28/2021 Goal Status: Active Interventions: Assess patient/caregiver ability to obtain necessary supplies Assess ulceration(s) every visit Provide education on ulcer and skin care Treatment Activities: Skin care regimen initiated : 10/17/2020 Notes: Electronic Signature(s) Signed: 12/15/2020 10:57:08 AM By: Lajean Manes RN [place], Tel A. ([id number]) Entered By: Phillis Haggis, Dondra Prader on 12/15/2020 10:29:27 [name] ([id number]) -------------------------------------------------------------------------------- Pain Assessment Details Patient Name: [name] Date of Service: 12/15/2020 10:00 AM Medical Record Number: [id number] Patient Account Number: [id number] Date of Birth/Sex: [birth year] (87 y.o. M) Treating RN: Rogers Blocker Primary Care Krystian Younglove: [name] Other Clinician: Referring Brexley Cutshaw: [name] Treating Krayton Wortley/Extender: Rowan Blase in Treatment: 8 Active Problems Location of Pain Severity and Description of Pain Patient Has Paino No Site Locations Rate the pain. Current Pain Level: 0 Pain Management and Medication Current Pain  Management: Electronic Signature(s) Signed: 12/15/2020 10:57:08 AM By: Phillis Haggis, Dondra Prader RN Entered By: Phillis Haggis, Kenia on 12/15/2020 10:13:48 Joellyn Quails ([id number]) -------------------------------------------------------------------------------- Patient/Caregiver Education Details Patient Name: [name] Date of Service: 12/15/2020 10:00 AM Medical Record Number: [id number] Patient Account Number: [id number] Date of Birth/Gender: 12-22-1930 (87 y.o. M) Treating RN: Rogers Blocker Primary Care Physician: [name] Other Clinician: Referring Physician: [name] Treating Physician/Extender: Rowan Blase in Treatment: 8 Education Assessment Education Provided To: Patient and Caregiver Education Topics Provided Wound/Skin Impairment: Methods: Explain/Verbal Responses: State content correctly Electronic Signature(s) Signed: 12/15/2020 10:57:08 AM By: Phillis Haggis, Dondra Prader RN Entered By: Phillis Haggis, Dondra Prader on 12/15/2020 10:47:10 [name] ([id number]) -------------------------------------------------------------------------------- Wound Assessment Details Patient Name: [name] Date of Service: 12/15/2020 10:00 AM Medical Record Number: [id number] Patient Account Number: [id number] Date of Birth/Sex: 1931/04/24 (87 y.o. M) Treating RN: Rogers Blocker Primary Care Anthon Harpole: [name] Other Clinician: Referring Zela Sobieski: [name] Treating Blane Worthington/Extender: Allen Derry Weeks in Treatment: 8 Wound Status Wound Number: 10 Primary Etiology: Venous Leg Ulcer Wound Location: Right Lower Leg Wound Status: Healed - Epithelialized Wounding Event: Gradually  Appeared Date Acquired: 10/25/2020 Weeks Of Treatment: 2 Clustered Wound: No Wound Measurements Length: (cm) Width: (cm) Depth: (cm) Area: (cm) Volume: (cm) 0 % Reduction in Area: 100% 0 % Reduction in Volume: 100% 0 0 0 Wound  Description Classification: Full Thickness Without Exposed Support Structu res Treatment Notes Wound #10 (Lower Leg) Wound Laterality: Right Cleanser Peri-Wound Care Topical Primary Dressing Secondary Dressing Secured With Compression Wrap Compression Stockings Add-Ons Electronic Signature(s) Signed: 12/15/2020 10:57:08 AM By: Phillis HaggisSanchez Pereyda, Dondra PraderKenia RN Entered By: Phillis HaggisSanchez Pereyda, Dondra PraderKenia on 12/15/2020 10:23:33 [name], Bryceson A. ([id number]) -------------------------------------------------------------------------------- Wound Assessment Details Patient Name: [name], Trisha A. Date of Service: 12/15/2020 10:00 AM Medical Record Number: [id number] Patient Account Number: [id number] Date of Birth/Sex: Feb 19, 1931 (87 y.o. M) Treating RN: Rogers BlockerSanchez, Kenia Primary Care Asmar Brozek: Marcelino DusterJohnston, John Other Clinician: Referring Kiesha Ensey: Marcelino DusterJohnston, John Treating Brion Sossamon/Extender: Allen DerryStone, Hoyt Weeks in Treatment: 8 Wound Status Wound Number: 8 Primary Venous Leg Ulcer Etiology: Wound Location: Left, Medial Lower Leg Wound Open Wounding Event: Gradually Appeared Status: Date Acquired: 09/25/2020 Comorbid Cataracts, Sleep Apnea, Congestive Heart Failure, Weeks Of Treatment: 8 History: Coronary Artery Disease, Hypertension, Osteoarthritis Clustered Wound: No Photos Wound Measurements Length: (cm) 0.2 Width: (cm) 0.3 Depth: (cm) 0.1 Area: (cm) 0.047 Volume: (cm) 0.005 % Reduction in Area: 93.4% % Reduction in Volume: 93% Epithelialization: Medium (34-66%) Tunneling: No Undermining: No Wound Description Classification: Full Thickness Without Exposed Support Structures Wound Margin: Flat and Intact Exudate Amount: Small Exudate Type: Serous Exudate Color: amber Foul Odor After Cleansing: No Slough/Fibrino No Wound Bed Granulation Amount: Medium (34-66%) Exposed Structure Granulation Quality: Pink, Pale Fascia Exposed: No Necrotic Amount: None Present (0%) Fat Layer (Subcutaneous  Tissue) Exposed: Yes Tendon Exposed: No Muscle Exposed: No Joint Exposed: No Bone Exposed: No Treatment Notes Wound #8 (Lower Leg) Wound Laterality: Left, Medial Cleanser Normal Saline Discharge Instruction: Wash your hands with soap and water. Remove old dressing, discard into plastic bag and place into trash. Cleanse the wound with Normal Saline prior to applying a clean dressing using gauze sponges, not tissues or cotton balls. Do not scrub or use excessive force. Pat dry using gauze sponges, not tissue or cotton balls. [name], Bailen A. ([id number]) Peri-Wound Care Topical Primary Dressing Silvercel Small 2x2 (in/in) Discharge Instruction: Apply Silvercel Small 2x2 (in/in) as instructed Secondary Dressing Gauze Discharge Instruction: As directed Secured With Kerlix Roll Sterile or Non-Sterile 6-ply 4.5x4 (yd/yd) Discharge Instruction: Apply Kerlix as directed Tubigrip Size F, 4x10 (in/yd) Discharge Instruction: Apply Tubigrip F 3 finger-widths below knee to base of toes to secure dressing and/or for swelling. Compression Wrap Compression Stockings Add-Ons Electronic Signature(s) Signed: 12/15/2020 10:57:08 AM By: Phillis HaggisSanchez Pereyda, Dondra PraderKenia RN Entered By: Phillis HaggisSanchez Pereyda, Kenia on 12/15/2020 10:24:44 [name], Michael A. ([id number]) -------------------------------------------------------------------------------- Vitals Details Patient Name: [name], Mcgwire A. Date of Service: 12/15/2020 10:00 AM Medical Record Number: [id number] Patient Account Number: [id number] Date of Birth/Sex: Feb 19, 1931 (87 y.o. M) Treating RN: Rogers BlockerSanchez, Kenia Primary Care Ladaysha Soutar: Marcelino DusterJohnston, John Other Clinician: Referring Kamilya Wakeman: Marcelino DusterJohnston, John Treating Nelissa Bolduc/Extender: Rowan BlaseStone, Hoyt Weeks in Treatment: 8 Vital Signs Time Taken: 10:11 Temperature (F): 97.6 Pulse (bpm): 69 Respiratory Rate (breaths/min): 16 Blood Pressure (mmHg): 122/77 Reference Range: 80 - 120 mg / dl Electronic  Signature(s) Signed: 12/15/2020 10:57:08 AM By: Phillis HaggisSanchez Pereyda, Dondra PraderKenia RN Entered By: Phillis HaggisSanchez Pereyda, Dondra PraderKenia on 12/15/2020 10:13:32

## 2020-12-20 ENCOUNTER — Other Ambulatory Visit: Payer: Self-pay

## 2020-12-20 ENCOUNTER — Non-Acute Institutional Stay: Payer: Medicare Other | Admitting: Adult Health Nurse Practitioner

## 2020-12-20 DIAGNOSIS — I83028 Varicose veins of left lower extremity with ulcer other part of lower leg: Secondary | ICD-10-CM

## 2020-12-20 DIAGNOSIS — Z515 Encounter for palliative care: Secondary | ICD-10-CM

## 2020-12-20 DIAGNOSIS — L97829 Non-pressure chronic ulcer of other part of left lower leg with unspecified severity: Secondary | ICD-10-CM

## 2020-12-20 DIAGNOSIS — R400 Somnolence: Secondary | ICD-10-CM

## 2020-12-20 DIAGNOSIS — I5032 Chronic diastolic (congestive) heart failure: Secondary | ICD-10-CM

## 2020-12-20 NOTE — Progress Notes (Signed)
Therapist, nutritional Palliative Care Consult Note Telephone: [phone]  Fax: [phone]  PATIENT NAME: [name] DOB: 09-16-1931 MRN: [id number]  PRIMARY CARE PROVIDER:   Dr. Delano Metz PROVIDER:  Dr. Dareen Piano  RESPONSIBLE PARTY:   Daughter, Prudencio Burly [phone]  Chief complaint:  Follow up palliative visit/drowsiness  RECOMMENDATIONS and PLAN: 1.Advanced care planning. Patient is DNR.  Spoke with daughter on phone to update on visit  2.  Drowsiness.  This is a new symptom.  Unsure if disease progression, underlying undiagnosed disease process, or medication related.  He has not had any new medication changes.  He is on several medications and recommend pharmacy to do a med review.  Have reached out to facility to request this  3.  CHF.  Patient doing well on current med regimen.  Does have occasional increase in edema that is relieved with extra dose of torsemide.  Continue current CHF medication regimen  4.  Chronic venous ulcer.  This is being followed by wound clinic.  Per nurse reports this is improving.  Continue follow up and recommendations by wound clinic.  Had discussion with daughter about her father's decline and drowsiness being related to disease progression, undiagnosed disease, or medication related.  She wants to limit doctor's visits, testing, and new meds as much as possible.  Does not want to pursue hospice at this time.  Palliative will continue to monitor for symptom management/decline and make recommendations as needed.  Will follow up in 6-8 weeks.  Daughter encouraged to call with any questions or concerns.  I spent 40 minutes providing this consultation. More than 50% of the time in this consultation was spent coordinating communication.   HISTORY OF PRESENT ILLNESS:  [name] is a 87 y.o. year old male with multiple medical problems including aortic stenosis w/valve replacement (on  coumadin), CHF, HTN, CAD, venous stasis. Palliative Care was asked to help address goals of care. Patient is now a resident in the SNF at [facility] [place].  Six months prior he had been a resident in one of their independent living apartments.  Patient had ED visit on 10/30/20 after being seen at wound clinic with concerns for cellulitis.  He was sent back to SNF with IV antibiotics for cellulitis of left lower leg.  He was treated at facility for cellulitis of left leg again in early January.  Patient's wound to left lower leg is chronic venous stasis ulcer which is being followed at wound clinic.  Nurse notes indicate ulcer is healing.  He denies any pain to the wound.  States that occasionally gets pain to hips and knees that are relieved with Tylenol.  Patient has SOB the comes and goes when his edema worsens.  Is on torsemide 40 mg BID and PRN.  Patient uses CPAP at night.  Appetite is good and weight is stable in the 190s.  Patient has had a functional decline and staff report that he now requires more help with ADLs.  Can stand up on his own and use a walker to ambulate but also uses a wheelchair to get around. Staff and daughter report that over the past 4-6 weeks that he has been having more drowsiness.  Other than antibiotic use he has not had any medication changes.  Though do have concerns that he has benadryl PRN which can cause drowsiness and confusion in geriatric patients.  He does not get this everyday and his drowsiness is everyday. He  dozes off even in mid sentence and then will just spontaneously wake back up.   He would not wake up to my voice or touch but just rouse on his own.  He does not doze off for very long, maybe just for a minute but he did this more than 5 times in 15 minutes.    CODE STATUS: DNR  PPS: 40% HOSPICE ELIGIBILITY/DIAGNOSIS: TBD  PHYSICAL EXAM: HR 62 O2 98% on RA General: NAD, frail appearing Eyes: sclera anicteric and noninjected with no discharge  noted Cardiovascular: regular rate and rhythm Pulmonary:lung sounds clear; normal respiratory effort Abdomen: soft, nontender, + bowel sounds GU: no suprapubic tenderness Extremities: no edema, no joint deformities; has wound to left lower leg, did not take off bandage for exam (being followed at wound clinic) Skin: no rasheson exposed skin Neurological: Weakness; drowsiness  PAST MEDICAL HISTORY:  Past Medical History:  Diagnosis Date  . Anxiety 10/11  . Aortic stenosis    a. s/p mechcanical AVR, 2002; b. 10/2018 Echo: Triv AI, mean grad .  . Bradycardia    chronic, no symptoms 07/2010  . C. difficile colitis   . Carotid bruit    dopplers in past, no abnormalities  . Chronic diastolic CHF (congestive heart failure) (HCC)    a. Echo 03/2015: EF 60-65%, no RWMA, GR1DD, mild BAE, mild to mod MR, mod TR, PASP 65 mmHg; b. 01/2017 Echo: EF 55-60%, NRWMA, grade 1 diastolic dysfunction.  Normal functioning prosthetic aortic valve.  Mean gradient 50 mmHg.  Sev TR. PASP ; c. 10/2018 Echo: EF 55-60%, Triv AI, mod dil LA, mod-sev TR, PASP 35-40, mild to mod red RV fxn.  . Coronary artery disease    a. mild, cath, 08/2010; b. medically managed  . Decreased hearing    Right ear  . Depression   . Gastric ulcer   . GERD (gastroesophageal reflux disease)   . Hypertension    BP higher than usual 04/19/10; amlodipine increased by telephone  . Mod-Sev Tricuspid regurgitation    a. 10/2018 Echo: Mod-Sev TR, PASP 35-28mmHg.  Marland Kitchen RLS (restless legs syndrome) 08/23/2015  . S/P AVR    a. [employer]. mechanical 2002; b. echo 08/2010 EF 60%, trival AI, mild MR, AVR working well; c. on longterm warfarin tx  . SOB (shortness of breath) 10/11   08/2010,Episodes at 5 AM, eventually felt to be anxiety, after complete workup including catheterization, pt greatly improved with anxiety meds 11/11    SOCIAL HX:  Social History   Tobacco Use  . Smoking status: Former Smoker    Types: Cigarettes    Quit  date: 1979    Years since quitting: 43.0  . Smokeless tobacco: Never Used  Substance Use Topics  . Alcohol use: Yes    Alcohol/week: 3.0 standard drinks    Types: 3 Glasses of wine per week    ALLERGIES:  Allergies  Allergen Reactions  . Sulfa Antibiotics Other (See Comments)    Reaction:  Unknown   . Levofloxacin Nausea Only     PERTINENT MEDICATIONS:  Outpatient Encounter Medications as of 12/20/2020  Medication Sig  . Acetaminophen 500 MG capsule Take 2 capsules by mouth every 8 (eight) hours as needed.  Marland Kitchen allopurinol (ZYLOPRIM) 300 MG tablet Take 300 mg by mouth daily.   Marland Kitchen ALPRAZolam (XANAX) 0.25 MG tablet Take 1 tablet (0.25 mg total) by mouth daily as needed for anxiety.  . busPIRone (BUSPAR) 5 MG tablet Take 1 tablet (5 mg total) by mouth  2 (two) times daily.  . Cetirizine HCl 10 MG CAPS Take 10 mg by mouth daily.  . DULoxetine (CYMBALTA) 30 MG capsule Take 1 capsule (30 mg total) by mouth daily.  . finasteride (PROSCAR) 5 MG tablet Take 5 mg by mouth daily.    . metolazone (ZAROXOLYN) 2.5 MG tablet Take 1 tablet (2.5 mg total) by mouth 2 (two) times a week.  . metoprolol succinate (TOPROL-XL) 25 MG 24 hr tablet Take 0.5 tablets (12.5 mg total) by mouth daily.  . pantoprazole (PROTONIX) 40 MG tablet Take 40 mg by mouth daily.  . potassium chloride (KLOR-CON) 10 MEQ tablet TAKE 3 TABLETS (30 MEQ) BY MOUTH DAILY. TAKE AN ADDITIONAL 2TABS ON THE DAYS YOU TAKE METOLAZONE (Patient taking differently: Take 30-50 mEq by mouth daily. )  . rOPINIRole (REQUIP) 2 MG tablet TAKE 1 TABLET BY MOUTH 4  TIMES DAILY (Patient taking differently: Take 2 mg by mouth in the morning, at noon, in the evening, and at bedtime. Take 1 tablet by mouth 4  times daily)  . torsemide (DEMADEX) 20 MG tablet Take 2 tablets (40 mg total) by mouth 2 (two) times daily.  . traZODone (DESYREL) 50 MG tablet Take 50 mg by mouth at bedtime.  Marland Kitchen warfarin (COUMADIN) 1 MG tablet Take 0.5 mg by mouth daily. Take along with  3 mg tablet for total 3.5 mg once daily  . warfarin (COUMADIN) 3 MG tablet Take 3 mg by mouth daily. Take along with 0.5 mg for total 3.5 mg once daily   No facility-administered encounter medications on file as of 12/20/2020.     Seynabou Fults Marlena Clipper, NP

## 2020-12-22 ENCOUNTER — Other Ambulatory Visit
Admission: RE | Admit: 2020-12-22 | Discharge: 2020-12-22 | Disposition: A | Discharge status: Home / Self Care | Payer: Medicare Other | Source: Skilled Nursing Facility | Attending: Internal Medicine | Admitting: Internal Medicine

## 2020-12-22 DIAGNOSIS — I4891 Unspecified atrial fibrillation: Secondary | ICD-10-CM | POA: Insufficient documentation

## 2020-12-22 LAB — PROTIME-INR
INR: 3.6 — ABNORMAL HIGH (ref 0.8–1.2)
Prothrombin Time: 34.9 seconds — ABNORMAL HIGH (ref 11.4–15.2)

## 2020-12-29 ENCOUNTER — Other Ambulatory Visit
Admission: RE | Admit: 2020-12-29 | Discharge: 2020-12-29 | Disposition: A | Discharge status: Home / Self Care | Payer: Medicare Other | Source: Skilled Nursing Facility | Attending: Internal Medicine | Admitting: Internal Medicine

## 2020-12-29 DIAGNOSIS — I4891 Unspecified atrial fibrillation: Secondary | ICD-10-CM | POA: Insufficient documentation

## 2020-12-29 LAB — PROTIME-INR
INR: 3.1 — ABNORMAL HIGH (ref 0.8–1.2)
Prothrombin Time: 30.8 seconds — ABNORMAL HIGH (ref 11.4–15.2)

## 2021-01-01 ENCOUNTER — Other Ambulatory Visit: Payer: Self-pay

## 2021-01-01 ENCOUNTER — Encounter: Payer: Medicare Other | Attending: Physician Assistant | Admitting: Physician Assistant

## 2021-01-01 DIAGNOSIS — Z7901 Long term (current) use of anticoagulants: Secondary | ICD-10-CM | POA: Insufficient documentation

## 2021-01-01 DIAGNOSIS — G629 Polyneuropathy, unspecified: Secondary | ICD-10-CM | POA: Diagnosis not present

## 2021-01-01 DIAGNOSIS — L97812 Non-pressure chronic ulcer of other part of right lower leg with fat layer exposed: Secondary | ICD-10-CM | POA: Diagnosis not present

## 2021-01-01 DIAGNOSIS — I5042 Chronic combined systolic (congestive) and diastolic (congestive) heart failure: Secondary | ICD-10-CM | POA: Insufficient documentation

## 2021-01-01 DIAGNOSIS — I890 Lymphedema, not elsewhere classified: Secondary | ICD-10-CM | POA: Diagnosis not present

## 2021-01-01 DIAGNOSIS — Z952 Presence of prosthetic heart valve: Secondary | ICD-10-CM | POA: Diagnosis not present

## 2021-01-01 DIAGNOSIS — Z950 Presence of cardiac pacemaker: Secondary | ICD-10-CM | POA: Diagnosis not present

## 2021-01-01 DIAGNOSIS — I872 Venous insufficiency (chronic) (peripheral): Secondary | ICD-10-CM | POA: Insufficient documentation

## 2021-01-01 DIAGNOSIS — L97822 Non-pressure chronic ulcer of other part of left lower leg with fat layer exposed: Secondary | ICD-10-CM | POA: Insufficient documentation

## 2021-01-01 DIAGNOSIS — I130 Hypertensive heart and chronic kidney disease with heart failure and stage 1 through stage 4 chronic kidney disease, or unspecified chronic kidney disease: Secondary | ICD-10-CM | POA: Diagnosis not present

## 2021-01-01 DIAGNOSIS — N1832 Chronic kidney disease, stage 3b: Secondary | ICD-10-CM | POA: Diagnosis not present

## 2021-01-01 NOTE — Progress Notes (Addendum)
NOEH, SPARACINO ([id number]) Visit Report for 01/01/2021 Chief Complaint Document Details Patient Name: [name], [name] Date of Service: 01/01/2021 11:00 AM Medical Record Number: [id number] Patient Account Number: [id number] Date of Birth/Sex: 1931-05-13 (87 y.o. M) Treating RN: Yevonne Pax Primary Care Provider: [name] Other Clinician: Referring Provider: [name] Treating Provider/Extender: Rowan Blase in Treatment: 10 Information Obtained from: Patient Chief Complaint Left Medical LE Ulcers Electronic Signature(s) Signed: 01/01/2021 11:12:41 AM By: Lenda Kelp PA-C Entered By: Lenda Kelp on 01/01/2021 11:12:40 [name] Hilts A. ([id number]) -------------------------------------------------------------------------------- HPI Details Patient Name: [name] Hilts A. Date of Service: 01/01/2021 11:00 AM Medical Record Number: [id number] Patient Account Number: [id number] Date of Birth/Sex: May 17, 1931 (87 y.o. M) Treating RN: Yevonne Pax Primary Care Provider: [name] Other Clinician: Referring Provider: [name] Treating Provider/Extender: Rowan Blase in Treatment: 10 History of Present Illness HPI Description: 04/21/2019 ADMISSION This is an independent 87 year old man who lives in the independent part of [address] of [place]. He has a history of chronic lower extremity edema and wears compression stockings. He states about a month ago he was taking off the one on the right and pulled some skin off accidentally. He has had 2 small wounds on the right anterior and right medial lower extremity. I note looking through [facility] health link that he had multiple venous ultrasounds in 2015 and 16. These were DVT rule outs. He did have a Baker's cyst on the left. He has not not had a previous wound history although he did have a history of cellulitis in his legs. Past medical history; includes aortic stenosis status post mechanical AVR in 2007 on  chronic Coumadin, lower extremity edema with a history of cellulitis, history of MRSA. ABIs in our clinic were 1.1 on the right 6/3; patient with predominantly venous insufficiency ulcers in the right lower leg probably some degree of lymphedema. He had to wounds last week. We put him in 3 layer compression. The nurses at [facility] are changing the dressing. The area laterally is healed but he still has a small very painful area on the anterior tibial area. He is on Coumadin because of mechanical aortic valve. 6/10; venous insufficiency ulcers on the right lower leg. He has some degree of lymphedema. We have been using 3 layer compression with silver collagen small wound. Dimensions are improved. I gave him doxycycline last week which he tolerated because of surrounding erythema. This is improved also 6/17; arrives in clinic today with portable silver collagen dressing tightly adherent to the wound. Also felt that the wrap was too tight 3 layer being changed at the [facility] 6/24; patient's wound is small still adherent debris over the surface however even under illumination it was hard to see what was still open here. We have been using silver collagen 7/1; the patient arrives in the clinic today and the area on the right lower leg is healed. He has severe chronic venous insufficiency. He has 20/30 compression stockings. Readmission: 06/22/2020 on evaluation today patient presents for reevaluation here in our clinic although it has been a little over a year since we last saw him. He does have a history of lymphedema, venous insufficiency, anticoagulant therapy, he is on a pacemaker which is the reason for the anticoagulants, hypertension, and congestive heart failure. With that being said unfortunately he has been dealing with a wound of the left and right legs which has been giving him some trouble since arrival first 2021. That is in  regard to the left leg ulcer. In regard  to the right leg ulcer this is just a very small area that really I think will seal up quite nicely is more of the lymphedema opening than anything else. With that being said unfortunately the left leg is quite significant. We actually have noted that the patient's been dealing with this for quite some time and I think that there is a chance we may want to consider biopsy. With that being said he is on Coumadin therefore we were not able to perform a biopsy at this point. I think that something however in the future that may be warranted we may just have to take him off of the current Coumadin regimen in order to do this. We discussed doing it today but I am more concerned about the fact that he could have issues with uncontrolled bleeding and in that case we may have to send him to the ER. His daughter was not wanting to take that chance which I can completely understand. Nonetheless so far they have been using antibiotic ointment over the area I am just putting a protective dressing I do believe the patient is that he needs some type of compression. 07/07/2020 on evaluation today patient appears to be doing poorly in regard to his bilateral lower extremities today. He actually went to urgent care last night due to having pain in his legs due to the wraps. It appears they got somewhat tight on him because of the fact that he is having such significant swelling. He appears to be fluid overloaded I do not see any signs of infection actively at this point which is good news but unfortunately I think that this is something that may need to be addressed even by cardiology more so than just with a different or alternate type of wrap. I will contact his cardiologist, Dr. Mariah Milling, as well. 07/10/2020 upon evaluation today patient unfortunately appears to be doing worse even than when I saw him on Friday from the standpoint of where his legs stand. The main issue is that he has increased erythema although there  is still not warm to touch I am concerned about infection and about this getting significantly worse. Again my initial concern was more fluid overload and I discussed this with his cardiology office on Friday. With that being said I feel like this has gotten worse his daughter is in agreement and I think that the best option would be to send him to the hospital at this point. READMISSION 09/06/2020 Is a patient who has had 2 stays in this clinic firstly in 2020 and more recently from 06/02/2020 through 07/10/2020. He was cared for by Allen Derry. When he was last here he had bilateral lower extremity ulcers in the setting of chronic venous insufficiency with lymphedema. When he we last saw him he had weeping edema fluido Cellulitis and he was sent to the hospital. He was admitted initially from 06/30/2020 through 07/14/2020 with cellulitis plus stasis dermatitis. Treated empirically with vancomycin and had increases in his torsemide. He was then sent to the skilled level of [facility] [place] but readmitted to hospital from 07/29/2020 through 08/10/2020 with blood loss anemia hemoglobin of 4 and OB positive stools. During this hospitalization he was noted to have a sacral wound at stage II. He was sent to [facility] [place] again in the skilled level. He had compression on his legs with Coban. He is now back at his independent living setting. He  has not been wearing compression and his daughter feels there is already increasing swelling. To have stockings at home not exactly sure at what strength. [name], [name] ([id number]) Past medical history includes lymphedema, chronic venous insufficiency, pacemaker, congestive heart failure, hypertension, aortic valve stenosis status post mechanical valve replacement in 2002 on chronic Coumadin, chronic kidney disease stage IIIb, atrial fibrillation, peripheral neuropathy, large hiatal hernia. We did not test his ABIs in the clinic  today. Readmission: 10/17/2020 on evaluation today patient appears to be doing somewhat poorly in regard to his left leg and the fact that he does have an open wound at this point. He has not had the compression socks placed at the facility they have not been putting them on at all unfortunately. He does not have on the right leg currently and apparently there is significant put him on the left leg with the wound which I understand is that can be somewhat tight obviously. Nonetheless I do believe the right leg needs to have a compression sock and I do believe on the left side right now we can do something a little better in order to try to get the wound healed at this point. The patient is on torsemide and currently allopurinol for gout. He also has daily weights due to congestive heart failure I am assuming in order to monitor his fluid status overall. He is currently a DNR at the facility. 10/30/2020 upon evaluation today patient appears to be doing somewhat poorly at this time in regard to his bilateral lower extremities. His left leg which is the only place where he had a wound last week when I saw him is actually doing worse with new wound openings. The right leg also has a small wound as well. However the most concerning thing is that last week he did not have any signs of infection or erythema there is no warmth to touch over the leg. This week the entire leg is more sore he also has erythema extending from the ankle to just below the knee with the area being erythematous and warm to touch which I believe represents cellulitis at minimum. I am concerned about how this is spread so quickly and the fact that he is having increased pain and also not feeling as well. All in all I discussed with the patient that unfortunately I feel like he may be best served going to the ER for further evaluation of this issue currently to try to see about the potential for IV antibiotics and to have  appropriate lab work performed. His daughter is present during the visit today and she was included in the decision making at this point. 11/13/2020 upon evaluation today patient appears to be doing well with regard to his leg ulcer. Fortunately he does not appear to be showing signs of infection anything like it was previous. This is great news. He was sent home with IV antibiotic therapy that seems to have done extremely well for him. No fevers, chills, nausea, vomiting, or diarrhea. 11/27/2020 upon evaluation today patient appears to be doing quite well in regard to his wounds currently. Fortunately there is no signs of active infection at this time which is great news and I am very pleased in this regard. He did have some concerns or his daughter did about an area on his gluteal region. With that being said I see nothing that is actually open at this point which is great news I do think continuing with a derma cloud  is a good way to go. With regard to his leg he does have a new area on the right leg of open wound locations which is new compared to last visit we will need to address this as well. Otherwise he seems to be doing quite well in my opinion. 12/15/2020 upon evaluation today patient appears to be doing well with regard to his leg ulcer on the left. With that being said he did not have the Tubigrip on which I think is integral to getting this closed as he continues to weep. Subsequently I think the Tubigrip will help control some of the edema which will help him in that regard. Fortunately there is no signs of active infection at this time. No fevers, chills, nausea, vomiting, or diarrhea. 01/01/2021 upon evaluation today patient appears to be doing well with regard to his wound. This is measuring better and actually is significantly smaller. Fortunately there is no signs of active infection at this time. No fevers, chills, nausea, vomiting, or diarrhea. Electronic Signature(s) Signed:  01/01/2021 11:28:26 AM By: Lenda Kelp PA-C Entered By: Lenda Kelp on 01/01/2021 11:28:26 [name] ([id number]) -------------------------------------------------------------------------------- Physical Exam Details Patient Name: [name] Hilts A. Date of Service: 01/01/2021 11:00 AM Medical Record Number: [id number] Patient Account Number: [id number] Date of Birth/Sex: 22-Feb-1931 (87 y.o. M) Treating RN: Yevonne Pax Primary Care Provider: [name] Other Clinician: Referring Provider: [name] Treating Provider/Extender: Allen Derry Weeks in Treatment: 10 Constitutional Well-nourished and well-hydrated in no acute distress. Respiratory normal breathing without difficulty. Psychiatric this patient is able to make decisions and demonstrates good insight into disease process. Alert and Oriented x 3. pleasant and cooperative. Notes Upon inspection patient's wound bed actually showed signs of good granulation epithelization at this point. There does not appear to be any evidence of infection which is great news and overall I am extremely pleased with where things stand today. Electronic Signature(s) Signed: 01/01/2021 11:28:59 AM By: Lenda Kelp PA-C Entered By: Lenda Kelp on 01/01/2021 11:28:59 [name] ([id number]) -------------------------------------------------------------------------------- Physician Orders Details Patient Name: [name] Hilts A. Date of Service: 01/01/2021 11:00 AM Medical Record Number: [id number] Patient Account Number: [id number] Date of Birth/Sex: 1931-09-13 (87 y.o. M) Treating RN: Yevonne Pax Primary Care Provider: [name] Other Clinician: Referring Provider: [name] Treating Provider/Extender: Rowan Blase in Treatment: 10 Verbal / Phone Orders: No Diagnosis Coding ICD-10 Coding Code Description I89.0 Lymphedema, not elsewhere classified I87.2 Venous insufficiency (chronic)  (peripheral) L97.822 Non-pressure chronic ulcer of other part of left lower leg with fat layer exposed L97.812 Non-pressure chronic ulcer of other part of right lower leg with fat layer exposed Z79.01 Long term (current) use of anticoagulants Z95.0 Presence of cardiac pacemaker I10 Essential (primary) hypertension I50.42 Chronic combined systolic (congestive) and diastolic (congestive) heart failure I25.10 Atherosclerotic heart disease of native coronary artery without angina pectoris Follow-up Appointments o Return Appointment in 2 weeks. Non-Wound Condition Right Lower Extremity o Apply appropriate compression. Wound Treatment Wound #8 - Lower Leg Wound Laterality: Left, Medial Cleanser: Normal Saline (Generic) 3 x Per Week/30 Days Discharge Instructions: Wash your hands with soap and water. Remove old dressing, discard into plastic bag and place into trash. Cleanse the wound with Normal Saline prior to applying a clean dressing using gauze sponges, not tissues or cotton balls. Do not scrub or use excessive force. Pat dry using gauze sponges, not tissue or cotton balls. Primary Dressing: Silvercel Small 2x2 (in/in) (Generic) 3 x Per Week/30 Days Discharge  Instructions: Apply Silvercel Small 2x2 (in/in) as instructed Secondary Dressing: Gauze 3 x Per Week/30 Days Discharge Instructions: As directed Secured With: Kerlix Roll Sterile or Non-Sterile 6-ply 4.5x4 (yd/yd) (Generic) 3 x Per Week/30 Days Discharge Instructions: Apply Kerlix as directed Secured With: Tubigrip Size F, 4x10 (in/yd) (Generic) 3 x Per Week/30 Days Discharge Instructions: Apply Tubigrip F 3 finger-widths below knee to base of toes to secure dressing and/or for swelling. Electronic Signature(s) Signed: 01/01/2021 4:46:05 PM By: Lenda KelpStone III, Jensine Luz PA-C Signed: 01/03/2021 9:44:48 AM By: Yevonne PaxEpps, Carrie RN Entered By: Yevonne PaxEpps, Carrie on 01/01/2021 11:23:33 [name], [name]  ([id number]) -------------------------------------------------------------------------------- Problem List Details Patient Name: [name], [name] Date of Service: 01/01/2021 11:00 AM Medical Record Number: [id number] Patient Account Number: [id number] Date of Birth/Sex: 08-28-1931 (87 y.o. M) Treating RN: Yevonne PaxEpps, Carrie Primary Care Provider: Marcelino DusterJohnston, John Other Clinician: Referring Provider: Marcelino DusterJohnston, John Treating Provider/Extender: Rowan BlaseStone, Alayne Estrella Weeks in Treatment: 10 Active Problems ICD-10 Encounter Code Description Active Date MDM Diagnosis I89.0 Lymphedema, not elsewhere classified 10/17/2020 No Yes I87.2 Venous insufficiency (chronic) (peripheral) 10/17/2020 No Yes L97.822 Non-pressure chronic ulcer of other part of left lower leg with fat layer 10/17/2020 No Yes exposed L97.812 Non-pressure chronic ulcer of other part of right lower leg with fat layer 10/17/2020 No Yes exposed Z79.01 Long term (current) use of anticoagulants 10/17/2020 No Yes Z95.0 Presence of cardiac pacemaker 10/17/2020 No Yes I10 Essential (primary) hypertension 10/17/2020 No Yes I50.42 Chronic combined systolic (congestive) and diastolic (congestive) heart 10/17/2020 No Yes failure I25.10 Atherosclerotic heart disease of native coronary artery without angina 10/17/2020 No Yes pectoris Inactive Problems Resolved Problems Electronic Signature(s) Signed: 01/01/2021 11:12:18 AM By: Lenda KelpStone III, Adrianna Dudas PA-C Entered By: Lenda KelpStone III, Achillies Buehl on 01/01/2021 11:12:17 [name], [name] ([id number]) -------------------------------------------------------------------------------- Progress Note Details Patient Name: [name], [name] Date of Service: 01/01/2021 11:00 AM Medical Record Number: [id number] Patient Account Number: [id number] Date of Birth/Sex: 08-28-1931 (87 y.o. M) Treating RN: Yevonne PaxEpps, Carrie Primary Care Provider: Marcelino DusterJohnston, John Other Clinician: Referring Provider: Marcelino DusterJohnston, John Treating Provider/Extender: Rowan BlaseStone,  Nelida Mandarino Weeks in Treatment: 10 Subjective Chief Complaint Information obtained from Patient Left Medical LE Ulcers History of Present Illness (HPI) 04/21/2019 ADMISSION This is an independent 87 year old man who lives [place] the [place] part [place] [address]. He has a history of chronic lower extremity edema and wears compression stockings. He states about a month ago he was taking off the one on the right and pulled some skin off accidentally. He has had 2 small wounds on the right anterior and right medial lower extremity. I note looking through [facility] health link that he had multiple venous ultrasounds in 2015 and 16. These were DVT rule outs. He did have a Baker's cyst on the left. He has not not had a previous wound history although he did have a history of cellulitis in his legs. Past medical history; includes aortic stenosis status post mechanical AVR in 2007 on chronic Coumadin, lower extremity edema with a history of cellulitis, history of MRSA. ABIs in our clinic were 1.1 on the right 6/3; patient with predominantly venous insufficiency ulcers in the right lower leg probably some degree of lymphedema. He had to wounds last week. We put him in 3 layer compression. [facility]. The area laterally is healed but he still has a small very painful area on the anterior tibial area. He is on Coumadin because of mechanical aortic valve. 6/10; venous insufficiency ulcers on the right lower leg. He  has some degree of lymphedema. We have been using 3 layer compression with silver collagen small wound. Dimensions are improved. I gave him doxycycline last week which he tolerated because of surrounding erythema. This is improved also 6/17; arrives in clinic today with portable silver collagen dressing tightly adherent to the wound. Also felt that the wrap was too tight 3 layer being changed at the [facility] 6/24; patient's wound is  small still adherent debris over the surface however even under illumination it was hard to see what was still open here. We have been using silver collagen 7/1; the patient arrives in the clinic today and the area on the right lower leg is healed. He has severe chronic venous insufficiency. He has 20/30 compression stockings. Readmission: 06/22/2020 on evaluation today patient presents for reevaluation here in our clinic although it has been a little over a year since we last saw him. He does have a history of lymphedema, venous insufficiency, anticoagulant therapy, he is on a pacemaker which is the reason for the anticoagulants, hypertension, and congestive heart failure. With that being said unfortunately he has been dealing with a wound of the left and right legs which has been giving him some trouble since arrival first 2021. That is in regard to the left leg ulcer. In regard to the right leg ulcer this is just a very small area that really I think will seal up quite nicely is more of the lymphedema opening than anything else. With that being said unfortunately the left leg is quite significant. We actually have noted that the patient's been dealing with this for quite some time and I think that there is a chance we may want to consider biopsy. With that being said he is on Coumadin therefore we were not able to perform a biopsy at this point. I think that something however in the future that may be warranted we may just have to take him off of the current Coumadin regimen in order to do this. We discussed doing it today but I am more concerned about the fact that he could have issues with uncontrolled bleeding and in that case we may have to send him to the ER. His daughter was not wanting to take that chance which I can completely understand. Nonetheless so far they have been using antibiotic ointment over the area I am just putting a protective dressing I do believe the patient is that he  needs some type of compression. 07/07/2020 on evaluation today patient appears to be doing poorly in regard to his bilateral lower extremities today. He actually went to urgent care last night due to having pain in his legs due to the wraps. It appears they got somewhat tight on him because of the fact that he is having such significant swelling. He appears to be fluid overloaded I do not see any signs of infection actively at this point which is good news but unfortunately I think that this is something that may need to be addressed even by cardiology more so than just with a different or alternate type of wrap. I will contact his cardiologist, Dr. Mariah Milling, as well. 07/10/2020 upon evaluation today patient unfortunately appears to be doing worse even than when I saw him on Friday from the standpoint of where his legs stand. The main issue is that he has increased erythema although there is still not warm to touch I am concerned about infection and about this getting significantly worse. Again my initial  concern was more fluid overload and I discussed this with his cardiology office on Friday. With that being said I feel like this has gotten worse his daughter is in agreement and I think that the best option would be to send him to the hospital at this point. READMISSION 09/06/2020 Is a patient who has had 2 stays in this clinic firstly in 2020 and more recently from 06/02/2020 through 07/10/2020. He was cared for by Allen Derry. When he was last here he had bilateral lower extremity ulcers in the setting of chronic venous insufficiency with lymphedema. When he we last saw him he had weeping edema fluido Cellulitis and he was sent to the hospital. He was admitted initially from 06/30/2020 through 07/14/2020 [facility] [place] [facility]. ([id number]) with cellulitis plus stasis dermatitis. Treated empirically with vancomycin and had increases in his torsemide. He was then sent to the skilled level of [place]  but readmitted to hospital from 07/29/2020 through 08/10/2020 with blood loss anemia hemoglobin of 4 and OB positive stools. During this hospitalization he was noted to have a sacral wound at stage II. He was sent to [facility] [place] again in the skilled level. He had compression on his legs with Coban. He is now back at his independent living setting. He has not been wearing compression and his daughter feels there is already increasing swelling. To have stockings at home not exactly sure at what strength. Past medical history includes lymphedema, chronic venous insufficiency, pacemaker, congestive heart failure, hypertension, aortic valve stenosis status post mechanical valve replacement in 2002 on chronic Coumadin, chronic kidney disease stage IIIb, atrial fibrillation, peripheral neuropathy, large hiatal hernia. We did not test his ABIs in the clinic today. Readmission: 10/17/2020 on evaluation today patient appears to be doing somewhat poorly in regard to his left leg and the fact that he does have an open wound at this point. He has not had the compression socks placed at the facility they have not been putting them on at all unfortunately. He does not have on the right leg currently and apparently there is significant put him on the left leg with the wound which I understand is that can be somewhat tight obviously. Nonetheless I do believe the right leg needs to have a compression sock and I do believe on the left side right now we can do something a little better in order to try to get the wound healed at this point. The patient is on torsemide and currently allopurinol for gout. He also has daily weights due to congestive heart failure I am assuming in order to monitor his fluid status overall. He is currently a DNR at the facility. 10/30/2020 upon evaluation today patient appears to be doing somewhat poorly at this time in regard to his bilateral lower extremities. His left leg which is  the only place where he had a wound last week when I saw him is actually doing worse with new wound openings. The right leg also has a small wound as well. However the most concerning thing is that last week he did not have any signs of infection or erythema there is no warmth to touch over the leg. This week the entire leg is more sore he also has erythema extending from the ankle to just below the knee with the area being erythematous and warm to touch which I believe represents cellulitis at minimum. I am concerned about how this is spread so quickly and the fact that he is  having increased pain and also not feeling as well. All in all I discussed with the patient that unfortunately I feel like he may be best served going to the ER for further evaluation of this issue currently to try to see about the potential for IV antibiotics and to have appropriate lab work performed. His daughter is present during the visit today and she was included in the decision making at this point. 11/13/2020 upon evaluation today patient appears to be doing well with regard to his leg ulcer. Fortunately he does not appear to be showing signs of infection anything like it was previous. This is great news. He was sent home with IV antibiotic therapy that seems to have done extremely well for him. No fevers, chills, nausea, vomiting, or diarrhea. 11/27/2020 upon evaluation today patient appears to be doing quite well in regard to his wounds currently. Fortunately there is no signs of active infection at this time which is great news and I am very pleased in this regard. He did have some concerns or his daughter did about an area on his gluteal region. With that being said I see nothing that is actually open at this point which is great news I do think continuing with a derma cloud is a good way to go. With regard to his leg he does have a new area on the right leg of open wound locations which is new compared to last visit  we will need to address this as well. Otherwise he seems to be doing quite well in my opinion. 12/15/2020 upon evaluation today patient appears to be doing well with regard to his leg ulcer on the left. With that being said he did not have the Tubigrip on which I think is integral to getting this closed as he continues to weep. Subsequently I think the Tubigrip will help control some of the edema which will help him in that regard. Fortunately there is no signs of active infection at this time. No fevers, chills, nausea, vomiting, or diarrhea. 01/01/2021 upon evaluation today patient appears to be doing well with regard to his wound. This is measuring better and actually is significantly smaller. Fortunately there is no signs of active infection at this time. No fevers, chills, nausea, vomiting, or diarrhea. Objective Constitutional Well-nourished and well-hydrated in no acute distress. Vitals Time Taken: 11:04 AM, Temperature: 97.5 F, Pulse: 58 bpm, Respiratory Rate: 18 breaths/min, Blood Pressure: 120/77 mmHg. Respiratory normal breathing without difficulty. Psychiatric this patient is able to make decisions and demonstrates good insight into disease process. Alert and Oriented x 3. pleasant and cooperative. General Notes: Upon inspection patient's wound bed actually showed signs of good granulation epithelization at this point. There does not appear to be any evidence of infection which is great news and overall I am extremely pleased with where things stand today. Integumentary (Hair, Skin) Wound #8 status is Open. Original cause of wound was Gradually Appeared. The wound is located on the Left,Medial Lower Leg. The wound measures 0.3cm length x 0.2cm width x 0.1cm depth; 0.047cm^2 area and 0.005cm^3 volume. There is Fat Layer (Subcutaneous Tissue) [name], [name] ([id number]) exposed. There is no tunneling or undermining noted. There is a small amount of serous drainage noted. The wound  margin is flat and intact. There is medium (34-66%) pink granulation within the wound bed. There is no necrotic tissue within the wound bed. 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 Non-Wound Condition: Apply appropriate compression. WOUND #8: - Lower Leg Wound Laterality: Left, Medial Cleanser: Normal Saline (Generic) 3 x Per Week/30 Days Discharge Instructions: Wash your hands with soap and water. Remove old dressing, discard into plastic bag and place into trash. Cleanse the wound with Normal Saline prior to applying a clean dressing using gauze sponges, not tissues or cotton balls. Do not scrub or use excessive force. Pat dry using gauze sponges, not tissue or cotton balls. Primary Dressing: Silvercel Small 2x2 (in/in) (Generic) 3 x Per Week/30 Days Discharge Instructions: Apply Silvercel Small 2x2 (in/in) as instructed Secondary Dressing: Gauze 3 x Per Week/30 Days Discharge Instructions: As directed Secured With: Kerlix Roll Sterile or Non-Sterile 6-ply 4.5x4 (yd/yd) (Generic) 3 x Per Week/30 Days Discharge Instructions: Apply Kerlix as directed Secured With: Tubigrip Size F, 4x10 (in/yd) (Generic) 3 x Per Week/30 Days Discharge Instructions: Apply Tubigrip F 3 finger-widths below knee to base of toes to secure dressing and/or for swelling. 1. I would recommend currently that we go ahead and continue with the wound care  measures as before utilizing the silver alginate I think less doing a good job. 2. Also can recommend the patient continue with the Tubigrip which I think is helping with edema control. 3. I would also recommend he continue to elevate his legs much as possible try to keep the swelling under control as well. We will see patient back for reevaluation in 1 week here in the clinic. If anything worsens or changes patient will contact our office for additional recommendations. Electronic Signature(s) Signed: 01/01/2021 11:29:30 AM By: Lenda Kelp PA-C Entered By: Lenda Kelp on 01/01/2021 11:29:30 [name] Hilts A. ([id number]) -------------------------------------------------------------------------------- SuperBill Details Patient Name: [name] Hilts A. Date of Service: 01/01/2021 Medical Record Number: [id number] Patient Account Number: [id number] Date of Birth/Sex: 02-Dec-1930 (87 y.o. M) Treating RN: Yevonne Pax Primary Care Provider: [name] Other Clinician: Referring Provider: [name] Treating Provider/Extender: Rowan Blase in Treatment: 10 Diagnosis Coding ICD-10 Codes Code Description I89.0 Lymphedema, not elsewhere classified I87.2 Venous insufficiency (chronic) (peripheral) L97.822 Non-pressure chronic ulcer of other part of left lower leg with fat layer exposed L97.812 Non-pressure chronic ulcer of other part of right lower leg with fat layer exposed Z79.01 Long term (current) use of anticoagulants Z95.0 Presence of cardiac pacemaker I10 Essential (primary) hypertension I50.42 Chronic combined systolic (congestive) and diastolic (congestive) heart failure I25.10 Atherosclerotic heart disease of native coronary artery without angina pectoris Facility Procedures CPT4 Code: [id number] Description: [phone] - WOUND CARE VISIT-LEV 2 EST PT Modifier: Quantity: 1 Physician Procedures CPT4 Code: 2952841 Description: 99213 - WC PHYS LEVEL 3 - EST  PT Modifier: Quantity: 1 CPT4 Code: Description: ICD-10 Diagnosis Description I89.0 Lymphedema, not elsewhere classified I87.2 Venous insufficiency (chronic) (peripheral) L97.822 Non-pressure chronic ulcer of other part of left lower leg with fat lay L97.812 Non-pressure chronic ulcer of  other part of right lower leg with fat la Modifier: er exposed yer exposed Quantity: Electronic Signature(s) Signed: 01/01/2021 11:29:52 AM By: Lenda Kelp PA-C Entered By: Lenda Kelp on 01/01/2021 11:29:52

## 2021-01-03 NOTE — Progress Notes (Signed)
[name], [name] ([id number]) Visit Report for 01/01/2021 Arrival Information Details Patient Name: [name], [name] Date of Service: 01/01/2021 11:00 AM Medical Record Number: [id number] Patient Account Number: [id number] Date of Birth/Sex: 03-13-1931 (87 y.o. M) Treating RN: Rogers Blocker Primary Care Konnie Noffsinger: [name] Other Clinician: Referring Shellye Zandi: [name] Treating Tamieka Rancourt/Extender: Rowan Blase in [name]: 10 Visit Information History Since Last Visit Pain Present Now: No Patient Arrived: Wheel Chair Arrival Time: 11:03 Accompanied By: daughter Transfer Assistance: Manual Patient Identification Verified: Yes Secondary Verification Process Completed: Yes Patient Requires Transmission-Based Precautions: No Patient Has Alerts: No Electronic Signature(s) Signed: 01/01/2021 11:26:45 AM By: Phillis Haggis, Dondra Prader RN Entered By: Phillis Haggis, Dondra Prader on 01/01/2021 11:04:30 [name] Hilts A. ([id number]) -------------------------------------------------------------------------------- Clinic Level of Care Assessment Details Patient Name: [name] Hilts A. Date of Service: 01/01/2021 11:00 AM Medical Record Number: [id number] Patient Account Number: [id number] Date of Birth/Sex: 1931-04-26 (87 y.o. M) Treating RN: Yevonne Pax Primary Care Josuha Fontanez: [name] Other Clinician: Referring Alayiah Fontes: [name] Treating Jedidiah Demartini/Extender: Rowan Blase in [name]: 10 Clinic Level of Care Assessment Items TOOL 4 Quantity Score X - Use when only an EandM is performed on FOLLOW-UP visit 1 0 ASSESSMENTS - Nursing Assessment / Reassessment X - Reassessment of Co-morbidities (includes updates in patient status) 1 10 X- 1 5 Reassessment of Adherence to [name] Plan ASSESSMENTS - Wound and Skin Assessment / Reassessment X - Simple Wound Assessment / Reassessment - one wound 1 5 []  - 0 Complex Wound Assessment / Reassessment - multiple wounds []  -  0 Dermatologic / Skin Assessment (not related to wound area) ASSESSMENTS - Focused Assessment []  - Circumferential Edema Measurements - multi extremities 0 []  - 0 Nutritional Assessment / Counseling / Intervention []  - 0 Lower Extremity Assessment (monofilament, tuning fork, pulses) []  - 0 Peripheral Arterial Disease Assessment (using hand held doppler) ASSESSMENTS - Ostomy and/or Continence Assessment and Care []  - Incontinence Assessment and Management 0 []  - 0 Ostomy Care Assessment and Management (repouching, etc.) PROCESS - Coordination of Care X - Simple Patient / Family Education for ongoing care 1 15 []  - 0 Complex (extensive) Patient / Family Education for ongoing care []  - 0 Staff obtains , Records, Test Results / Process Orders []  - 0 Staff telephones HHA, Nursing Homes / Clarify orders / etc []  - 0 Routine Transfer to another Facility (non-emergent condition) []  - 0 Routine Hospital Admission (non-emergent condition) []  - 0 New Admissions / / Ordering NPWT, Apligraf, etc. []  - 0 Emergency Hospital Admission (emergent condition) X- 1 10 Simple Discharge Coordination []  - 0 Complex (extensive) Discharge Coordination PROCESS - Special Needs []  - Pediatric / Minor Patient Management 0 []  - 0 Isolation Patient Management []  - 0 Hearing / Language / Visual special needs []  - 0 Assessment of Community assistance (transportation, D/C planning, etc.) []  - 0 Additional assistance / Altered mentation []  - 0 Support Surface(s) Assessment (bed, cushion, seat, etc.) INTERVENTIONS - Wound Cleansing / Measurement Marks, [name] ( ) X- 1 5 Simple Wound Cleansing - one wound []  - 0 Complex Wound Cleansing - multiple wounds []  - 0 Wound Imaging (photographs - any number of wounds) []  - 0 Wound Tracing (instead of photographs) X- 1 5 Simple Wound Measurement - one wound []  - 0 Complex Wound Measurement - multiple  wounds INTERVENTIONS - Wound Dressings X - Small Wound Dressing one or multiple wounds 1 10 []  - 0 Medium Wound Dressing one or multiple wounds []  - 0  Large Wound Dressing one or multiple wounds X- 1 5 Application of Medications - topical []  - 0 Application of Medications - injection INTERVENTIONS - Miscellaneous []  - External ear exam 0 []  - 0 Specimen Collection (cultures, biopsies, blood, body fluids, etc.) []  - 0 Specimen(s) / Culture(s) sent or taken to Lab for analysis []  - 0 Patient Transfer (multiple staff / / Similar devices) []  - 0 Simple Staple / Suture removal (25 or less) []  - 0 Complex Staple / Suture removal (26 or more) []  - 0 Hypo / Hyperglycemic Management (close monitor of Blood Glucose) []  - 0 Ankle / Brachial Index (ABI) - do not check if billed separately X- 1 5 Vital Signs Has the patient been seen at the hospital within the last three years: Yes Total Score: 75 Level Of Care: New/Established - Level 2 Electronic Signature(s) Signed: 01/03/2021 9:44:48 AM By: RN Entered By: on 01/01/2021 11:24:15 Nurse, [name] ( ) -------------------------------------------------------------------------------- Encounter Discharge Information Details Patient Name: A. Date of Service: 01/01/2021 11:00 AM Medical Record Number: Patient Account Number: 03/03/2021 Date of Birth/Sex: [birth year] (87 y.o. M) Treating RN: 03/01/2021 Primary Care Kessie Croston: [name] Hilts Other Clinician: Referring Kyiah Canepa: [id number] Treating Romar Woodrick/Extender: [name]: 10 Encounter Discharge Information Items Discharge Condition: Stable Ambulatory Status: Ambulatory Discharge Destination: Home Transportation: Private Auto Accompanied By: self Schedule Follow-up Appointment: Yes Clinical Summary of Care: Electronic Signature(s) Signed: 01/03/2021 9:44:48 AM By: [id number]  RN Entered By: [id number] on 01/01/2021 11:36:52 [name], [name] ([id number]) -------------------------------------------------------------------------------- Lower Extremity Assessment Details Patient Name: Yevonne Pax A. Date of Service: 01/01/2021 11:00 AM Medical Record Number: [name] Patient Account Number: Rowan Blase Date of Birth/Sex: Mar 26, 1931 (87 y.o. M) Treating RN: Yevonne Pax Primary Care Kobi Aller: 03/01/2021 Other Clinician: Referring Avagail Whittlesey: [id number] Treating Solyana Nonaka/Extender: [name] Hilts [name] in [name]: 10 Edema Assessment Assessed: [Left: Yes] [Right: No] Edema: [Left: Ye] [Right: s] Calf Left: Right: Point of Measurement: 33 cm From Medial Instep 34 cm Ankle Left: Right: Point of Measurement: 10 cm From Medial Instep 22 cm Vascular Assessment Pulses: Dorsalis Pedis Palpable: [Left:Yes] Electronic Signature(s) Signed: 01/01/2021 11:26:45 AM By: [id number], [id number] RN Entered By: 09/14/1931, 06-11-1977 on 01/01/2021 11:15:47 [name] A. ([name]) -------------------------------------------------------------------------------- Multi Wound Chart Details Patient Name: [name] Derry A. Date of Service: 01/01/2021 11:00 AM Medical Record Number: Phillis Haggis Patient Account Number: Dondra Prader Date of Birth/Sex: 27-Nov-1930 (87 y.o. M) Treating RN: 03/01/2021 Primary Care Jaleeya Mcnelly: [name] Hilts Other Clinician: Referring Alsace Dowd: [id number] Treating Rogerio Boutelle/Extender: [name]: 10 Vital Signs Height(in): Pulse(bpm): 58 Weight(lbs): Blood Pressure(mmHg): 120/77 Body Mass Index(BMI): Temperature(F): 97.5 Respiratory Rate(breaths/min): 18 Photos: [N/A:N/A] Wound Location: Left, Medial Lower Leg N/A N/A Wounding Event: Gradually Appeared N/A N/A Primary Etiology: Venous Leg Ulcer N/A N/A Comorbid History: Cataracts, Sleep Apnea, Congestive N/A N/A Heart Failure, Coronary Artery Disease,  Hypertension, Osteoarthritis Date Acquired: 09/25/2020 N/A N/A [name] of [name]: 10 N/A N/A Wound Status: Open N/A N/A Measurements L x W x D (cm) 0.3x0.2x0.1 N/A N/A Area (cm) : 0.047 N/A N/A Volume (cm) : 0.005 N/A N/A % Reduction in Area: 93.40% N/A N/A % Reduction in Volume: 93.00% N/A N/A Classification: Full Thickness Without Exposed N/A N/A Support Structures Exudate Amount: Small N/A N/A Exudate Type: Serous N/A N/A Exudate Color: amber N/A N/A Wound Margin: Flat and Intact N/A N/A Granulation Amount: Medium (34-66%) N/A N/A Granulation Quality: Pink N/A N/A Necrotic Amount: None  Present (0%) N/A N/A Exposed Structures: Fat Layer (Subcutaneous Tissue): N/A N/A Yes Fascia: No Tendon: No Muscle: No Joint: No Bone: No Epithelialization: Medium (34-66%) N/A N/A [name] Notes Electronic Signature(s) Signed: 01/03/2021 9:44:48 AM By: Yevonne Pax RN Entered By: Yevonne Pax on 01/01/2021 11:23:09 [name] Hilts A. ([id number]) -------------------------------------------------------------------------------- Multi-Disciplinary Care Plan Details Patient Name: [name] Hilts A. Date of Service: 01/01/2021 11:00 AM Medical Record Number: [id number] Patient Account Number: [id number] Date of Birth/Sex: 07-23-1931 (87 y.o. M) Treating RN: Yevonne Pax Primary Care Kadisha Goodine: [name] Other Clinician: Referring Madgie Dhaliwal: [name] Treating Andelyn Spade/Extender: Rowan Blase in [name]: 10 Active Inactive Wound/Skin Impairment Nursing Diagnoses: Impaired tissue integrity Goals: Patient/caregiver will verbalize understanding of skin care regimen Date Initiated: 10/17/2020 Target Resolution Date: 01/26/2021 Goal Status: Active Ulcer/skin breakdown will have a volume reduction of 30% by week 4 Date Initiated: 10/17/2020 Date Inactivated: 01/01/2021 Target Resolution Date: 12/18/2019 Goal Status: Unmet Unmet Reason: comorbities Ulcer/skin breakdown will have a  volume reduction of 50% by week 8 Date Initiated: 10/17/2020 Target Resolution Date: 01/18/2020 Goal Status: Active Ulcer/skin breakdown will have a volume reduction of 80% by week 12 Date Initiated: 10/17/2020 Target Resolution Date: 02/15/2020 Goal Status: Active Ulcer/skin breakdown will heal within 14 [name] Date Initiated: 10/17/2020 Target Resolution Date: 02/28/2021 Goal Status: Active Interventions: Assess patient/caregiver ability to obtain necessary supplies Assess ulceration(s) every visit Provide education on ulcer and skin care [name] Activities: Skin care regimen initiated : 10/17/2020 Notes: Electronic Signature(s) Signed: 01/03/2021 9:44:48 AM By: Yevonne Pax RN Entered By: Yevonne Pax on 01/01/2021 11:29:06 [name], [name] ([id number]) -------------------------------------------------------------------------------- Pain Assessment Details Patient Name: [name] Hilts A. Date of Service: 01/01/2021 11:00 AM Medical Record Number: [id number] Patient Account Number: [id number] Date of Birth/Sex: 04/24/1931 (87 y.o. M) Treating RN: Rogers Blocker Primary Care Alexis Mizuno: [name] Other Clinician: Referring Laurelin Elson: [name] Treating Carolann Brazell/Extender: Rowan Blase in [name]: 10 Active Problems Location of Pain Severity and Description of Pain Patient Has Paino No Site Locations Pain Management and Medication Current Pain Management: Electronic Signature(s) Signed: 01/01/2021 11:26:45 AM By: Phillis Haggis, Dondra Prader RN Entered By: Phillis Haggis, Dondra Prader on 01/01/2021 11:08:14 [name] ([id number]) -------------------------------------------------------------------------------- Patient/Caregiver Education Details Patient Name: [name] Hilts A. Date of Service: 01/01/2021 11:00 AM Medical Record Number: [id number] Patient Account Number: [id number] Date of Birth/Gender: 07/01/1931 (87 y.o. M) Treating RN: Yevonne Pax Primary Care  Physician: [name] Other Clinician: Referring Physician: [name] Treating Physician/Extender: Rowan Blase in [name]: 10 Education Assessment Education Provided To: Patient Education Topics Provided Wound/Skin Impairment: Methods: Explain/Verbal Responses: State content correctly Electronic Signature(s) Signed: 01/03/2021 9:44:48 AM By: Yevonne Pax RN Entered By: Yevonne Pax on 01/01/2021 11:24:33 [name] Hilts A. ([id number]) -------------------------------------------------------------------------------- Wound Assessment Details Patient Name: [name] Hilts A. Date of Service: 01/01/2021 11:00 AM Medical Record Number: [id number] Patient Account Number: [id number] Date of Birth/Sex: 12/21/1930 (87 y.o. M) Treating RN: Rogers Blocker Primary Care Milton Streicher: [name] Other Clinician: Referring Orvan Papadakis: [name] Treating Dearl Rudden/Extender: [name]: 10 Wound Status Wound Number: 8 Primary Venous Leg Ulcer Etiology: Wound Location: Left, Medial Lower Leg Wound Open Wounding Event: Gradually Appeared Status: Date Acquired: 09/25/2020 Comorbid Cataracts, Sleep Apnea, Congestive Heart Failure, [name] Of [name]: 10 History: Coronary Artery Disease, Hypertension, Osteoarthritis Clustered Wound: No Photos Wound Measurements Length: (cm) 0.3 Width: (cm) 0.2 Depth: (cm) 0.1 Area: (cm) 0.047 Volume: (cm) 0.005 % Reduction in Area: 93.4% % Reduction in Volume: 93% Epithelialization: Medium (34-66%) Tunneling: No Undermining: No Wound Description Classification:  Full Thickness Without Exposed Support Structures Wound Margin: Flat and Intact Exudate Amount: Small Exudate Type: Serous Exudate Color: amber Foul Odor After Cleansing: No Slough/Fibrino No Wound Bed Granulation Amount: Medium (34-66%) Exposed Structure Granulation Quality: Pink Fascia Exposed: No Necrotic Amount: None Present (0%) Fat Layer  (Subcutaneous Tissue) Exposed: Yes Tendon Exposed: No Muscle Exposed: No Joint Exposed: No Bone Exposed: No [name] Notes Wound #8 (Lower Leg) Wound Laterality: Left, Medial Cleanser Normal Saline Discharge Instruction: Wash your hands with soap and water. Remove old dressing, discard into plastic bag and place into trash. Cleanse the wound with Normal Saline prior to applying a clean dressing using gauze sponges, not tissues or cotton balls. Do not scrub or use excessive force. Pat dry using gauze sponges, not tissue or cotton balls. [name], [name] ([id number]) Peri-Wound Care Topical Primary Dressing Silvercel Small 2x2 (in/in) Discharge Instruction: Apply Silvercel Small 2x2 (in/in) as instructed Secondary Dressing Gauze Discharge Instruction: As directed Secured With Kerlix Roll Sterile or Non-Sterile 6-ply 4.5x4 (yd/yd) Discharge Instruction: Apply Kerlix as directed Tubigrip Size F, 4x10 (in/yd) Discharge Instruction: Apply Tubigrip F 3 finger-widths below knee to base of toes to secure dressing and/or for swelling. Compression Wrap Compression Stockings Add-Ons Electronic Signature(s) Signed: 01/01/2021 11:26:45 AM By: Phillis Haggis, Dondra Prader RN Entered By: Phillis Haggis, Kenia on 01/01/2021 11:13:40 [name] Hilts A. ([id number]) -------------------------------------------------------------------------------- Vitals Details Patient Name: [name] Hilts A. Date of Service: 01/01/2021 11:00 AM Medical Record Number: [id number] Patient Account Number: [id number] Date of Birth/Sex: 21-Aug-1931 (87 y.o. M) Treating RN: Rogers Blocker Primary Care Irvin Lizama: [name] Other Clinician: Referring Teron Blais: [name] Treating Tran Arzuaga/Extender: [name]: 10 Vital Signs Time Taken: 11:04 Temperature (F): 97.5 Pulse (bpm): 58 Respiratory Rate (breaths/min): 18 Blood Pressure (mmHg): 120/77 Reference Range: 80 - 120 mg / dl Electronic  Signature(s) Signed: 01/01/2021 11:26:45 AM By: Phillis Haggis, Dondra Prader RN Entered By: Phillis Haggis, Dondra Prader on 01/01/2021 11:07:33

## 2021-01-15 ENCOUNTER — Other Ambulatory Visit: Payer: Self-pay

## 2021-01-15 ENCOUNTER — Encounter: Payer: Medicare Other | Admitting: Physician Assistant

## 2021-01-15 DIAGNOSIS — L97822 Non-pressure chronic ulcer of other part of left lower leg with fat layer exposed: Secondary | ICD-10-CM | POA: Diagnosis not present

## 2021-01-15 NOTE — Progress Notes (Signed)
KAMDYN, COLBORN ([id number]) Visit Report for 01/15/2021 Arrival Information Details Patient Name: ZHAIRE, LOCKER A. Date of Service: 01/15/2021 1:45 PM Medical Record Number: [id number] Patient Account Number: [id number] Date of Birth/Sex: 07-10-1931 (87 y.o. M) Treating RN: Yevonne Pax Primary Care Seth Higginbotham: [name] Other Clinician: Referring Kaliann Coryell: [name] Treating Kenzlee Fishburn/Extender: Rowan Blase in Treatment: 12 Visit Information History Since Last Visit Added or deleted any medications: No Patient Arrived: Wheel Chair Any new allergies or adverse reactions: No Arrival Time: 13:59 Had a fall or experienced change in No Accompanied By: daughter activities of daily living that may affect Transfer Assistance: None risk of falls: Patient Identification Verified: Yes Signs or symptoms of abuse/neglect since last visito No Secondary Verification Process Completed: Yes Hospitalized since last visit: No Patient Requires Transmission-Based Precautions: No Implantable device outside of the clinic excluding No Patient Has Alerts: No cellular tissue based products placed in the center since last visit: Has Dressing in Place as Prescribed: Yes Pain Present Now: No Electronic Signature(s) Signed: 01/15/2021 4:30:05 PM By: Dayton Martes RCP, RRT, CHT Entered By: Dayton Martes on 01/15/2021 13:59:38 Isaacks, Eryn A. ([id number]) -------------------------------------------------------------------------------- Clinic Level of Care Assessment Details Patient Name: [name] Date of Service: 01/15/2021 1:45 PM Medical Record Number: [id number] Patient Account Number: [id number] Date of Birth/Sex: 1931-02-04 (87 y.o. M) Treating RN: Rogers Blocker Primary Care Zanetta Dehaan: [name] Other Clinician: Referring Kippy Gohman: [name] Treating Arthelia Callicott/Extender: Rowan Blase in Treatment: 12 Clinic Level of Care Assessment  Items TOOL 4 Quantity Score X - Use when only an EandM is performed on FOLLOW-UP visit 1 0 ASSESSMENTS - Nursing Assessment / Reassessment X - Reassessment of Co-morbidities (includes updates in patient status) 1 10 X- 1 5 Reassessment of Adherence to Treatment Plan ASSESSMENTS - Wound and Skin Assessment / Reassessment []  - Simple Wound Assessment / Reassessment - one wound 0 X- 1 5 Complex Wound Assessment / Reassessment - multiple wounds []  - 0 Dermatologic / Skin Assessment (not related to wound area) ASSESSMENTS - Focused Assessment []  - Circumferential Edema Measurements - multi extremities 0 []  - 0 Nutritional Assessment / Counseling / Intervention []  - 0 Lower Extremity Assessment (monofilament, tuning fork, pulses) []  - 0 Peripheral Arterial Disease Assessment (using hand held doppler) ASSESSMENTS - Ostomy and/or Continence Assessment and Care []  - Incontinence Assessment and Management 0 []  - 0 Ostomy Care Assessment and Management (repouching, etc.) PROCESS - Coordination of Care X - Simple Patient / Family Education for ongoing care 1 15 []  - 0 Complex (extensive) Patient / Family Education for ongoing care []  - 0 Staff obtains , Records, Test Results / Process Orders []  - 0 Staff telephones HHA, Nursing Homes / Clarify orders / etc []  - 0 Routine Transfer to another Facility (non-emergent condition) []  - 0 Routine Hospital Admission (non-emergent condition) []  - 0 New Admissions / / Ordering NPWT, Apligraf, etc. []  - 0 Emergency Hospital Admission (emergent condition) X- 1 10 Simple Discharge Coordination []  - 0 Complex (extensive) Discharge Coordination PROCESS - Special Needs []  - Pediatric / Minor Patient Management 0 []  - 0 Isolation Patient Management []  - 0 Hearing / Language / Visual special needs []  - 0 Assessment of Community assistance (transportation, D/C planning, etc.) []  - 0 Additional assistance /  Altered mentation []  - 0 Support Surface(s) Assessment (bed, cushion, seat, etc.) INTERVENTIONS - Wound Cleansing / Measurement Prindle, Sigmund A. ( ) []  - 0 Simple Wound Cleansing - one wound X- 2  5 Complex Wound Cleansing - multiple wounds X- 1 5 Wound Imaging (photographs - any number of wounds) []  - 0 Wound Tracing (instead of photographs) []  - 0 Simple Wound Measurement - one wound X- 2 5 Complex Wound Measurement - multiple wounds INTERVENTIONS - Wound Dressings []  - Small Wound Dressing one or multiple wounds 0 X- 1 15 Medium Wound Dressing one or multiple wounds []  - 0 Large Wound Dressing one or multiple wounds []  - 0 Application of Medications - topical []  - 0 Application of Medications - injection INTERVENTIONS - Miscellaneous []  - External ear exam 0 []  - 0 Specimen Collection (cultures, biopsies, blood, body fluids, etc.) []  - 0 Specimen(s) / Culture(s) sent or taken to Lab for analysis []  - 0 Patient Transfer (multiple staff / / Similar devices) []  - 0 Simple Staple / Suture removal (25 or less) []  - 0 Complex Staple / Suture removal (26 or more) []  - 0 Hypo / Hyperglycemic Management (close monitor of Blood Glucose) []  - 0 Ankle / Brachial Index (ABI) - do not check if billed separately X- 1 5 Vital Signs Has the patient been seen at the hospital within the last three years: Yes Total Score: 90 Level Of Care: New/Established - Level 3 Electronic Signature(s) Signed: 01/15/2021 3:32:14 PM By: , RN Entered By: , Kenia on 01/15/2021 14:57:35 A. ( ) -------------------------------------------------------------------------------- Encounter Discharge Information Details Patient Name: A. Date of Service: 01/15/2021 1:45 PM Medical Record Number: Nurse, adult Patient Account Number: Date of Birth/Sex: [birth year] (87 y.o. M) Treating RN: Primary Care Shatarra Wehling: 01/17/2021 Other Clinician: Referring Duc Crocket: Phillis Haggis Treating Eamon Tantillo/Extender: Dondra Prader in Treatment: 12 Encounter Discharge Information Items Discharge Condition: Stable Ambulatory Status: Wheelchair Discharge Destination: Home Transportation: Private Auto Accompanied By: daughter Schedule Follow-up Appointment: Yes Clinical Summary of Care: Electronic Signature(s) Signed: 01/15/2021 3:32:14 PM By: 01/17/2021, [name] RN Entered By: [id number], [name] on 01/15/2021 14:58:26 [id number] A. ([id number]) -------------------------------------------------------------------------------- Lower Extremity Assessment Details Patient Name: 09/14/1931 A. Date of Service: 01/15/2021 1:45 PM Medical Record Number: Rogers Blocker Patient Account Number: [name] Date of Birth/Sex: Oct 13, 1931 (87 y.o. M) Treating RN: 01/17/2021 Primary Care Carney Saxton: Phillis Haggis Other Clinician: Referring Habeeb Puertas: Dondra Prader Treating Dmari Schubring/Extender: Phillis Haggis Weeks in Treatment: 12 Edema Assessment Assessed: [Left: Yes] [Right: Yes] Edema: [Left: Yes] [Right: Yes] Calf Left: Right: Point of Measurement: 33 cm From Medial Instep 35.5 cm 35.5 cm Ankle Left: Right: Point of Measurement: 10 cm From Medial Instep 22 cm 22 cm Vascular Assessment Pulses: Dorsalis Pedis Palpable: [Left:Yes] [Right:Yes] Electronic Signature(s) Signed: 01/15/2021 3:32:14 PM By: 01/17/2021, [name] RN Entered By: [id number], [name] on 01/15/2021 14:23:27 [id number] A. ([id number]) -------------------------------------------------------------------------------- Multi Wound Chart Details Patient Name: 09/14/1931 A. Date of Service: 01/15/2021 1:45 PM Medical Record Number: Rogers Blocker Patient Account Number: [name] Date of Birth/Sex: November 26, 1930 (87 y.o. M) Treating RN: 01/17/2021 Primary Care Kalisha Keadle: Phillis Haggis Other  Clinician: Referring Kaelin Bonelli: Dondra Prader Treating Joash Tony/Extender: Phillis Haggis in Treatment: 12 Vital Signs Height(in): Pulse(bpm): 71 Weight(lbs): Blood Pressure(mmHg): 147/73 Body Mass Index(BMI): Temperature(F): 98.2 Respiratory Rate(breaths/min): 18 Photos: [N/A:N/A] Wound Location: Right, Lateral Lower Leg Left, Medial Lower Leg N/A Wounding Event: Gradually Appeared Gradually Appeared N/A Primary Etiology: Venous Leg Ulcer Venous Leg Ulcer N/A Comorbid History: Cataracts, Sleep Apnea, Congestive Cataracts, Sleep Apnea, Congestive N/A Heart Failure, Coronary Artery Heart Failure, Coronary Artery Disease, Hypertension, Disease, Hypertension, Osteoarthritis Osteoarthritis Date  Acquired: 01/08/2021 09/25/2020 N/A Weeks of Treatment: 0 12 N/A Wound Status: Open Open N/A Measurements L x W x D (cm) 1.7x1x0.1 0.5x0.4x0.2 N/A Area (cm) : 1.335 0.157 N/A Volume (cm) : 0.134 0.031 N/A % Reduction in Area: 0.00% 77.80% N/A % Reduction in Volume: 0.00% 56.30% N/A Classification: Full Thickness Without Exposed Full Thickness Without Exposed N/A Support Structures Support Structures Exudate Amount: Medium Large N/A Exudate Type: Sanguinous Serous N/A Exudate Color: red amber N/A Wound Margin: N/A Flat and Intact N/A Granulation Amount: Large (67-100%) Medium (34-66%) N/A Granulation Quality: Red Pink N/A Necrotic Amount: None Present (0%) None Present (0%) N/A Exposed Structures: Fat Layer (Subcutaneous Tissue): Fat Layer (Subcutaneous Tissue): N/A Yes Yes Fascia: No Fascia: No Tendon: No Tendon: No Muscle: No Muscle: No Joint: No Joint: No Bone: No Bone: No Epithelialization: None Medium (34-66%) N/A Treatment Notes Electronic Signature(s) Signed: 01/15/2021 3:32:14 PM By: Phillis HaggisSanchez Pereyda, Dondra PraderKenia RN Entered By: Phillis HaggisSanchez Pereyda, Dondra PraderKenia on 01/15/2021 14:50:57 [name], Hilman A.  ([id number]) -------------------------------------------------------------------------------- Multi-Disciplinary Care Plan Details Patient Name: [name], Tulio A. Date of Service: 01/15/2021 1:45 PM Medical Record Number: [id number] Patient Account Number: [id number] Date of Birth/Sex: October 08, 1931 (87 y.o. M) Treating RN: Rogers BlockerSanchez, Kenia Primary Care Domenick Quebedeaux: Marcelino DusterJohnston, John Other Clinician: Referring Tayden Duran: Marcelino DusterJohnston, John Treating Domanik Rainville/Extender: Rowan BlaseStone, Hoyt Weeks in Treatment: 12 Active Inactive Wound/Skin Impairment Nursing Diagnoses: Impaired tissue integrity Goals: Patient/caregiver will verbalize understanding of skin care regimen Date Initiated: 10/17/2020 Target Resolution Date: 01/26/2021 Goal Status: Active Ulcer/skin breakdown will have a volume reduction of 30% by week 4 Date Initiated: 10/17/2020 Date Inactivated: 01/01/2021 Target Resolution Date: 12/18/2019 Goal Status: Unmet Unmet Reason: comorbities Ulcer/skin breakdown will have a volume reduction of 50% by week 8 Date Initiated: 10/17/2020 Target Resolution Date: 01/18/2020 Goal Status: Active Ulcer/skin breakdown will have a volume reduction of 80% by week 12 Date Initiated: 10/17/2020 Target Resolution Date: 02/15/2020 Goal Status: Active Ulcer/skin breakdown will heal within 14 weeks Date Initiated: 10/17/2020 Target Resolution Date: 02/28/2021 Goal Status: Active Interventions: Assess patient/caregiver ability to obtain necessary supplies Assess ulceration(s) every visit Provide education on ulcer and skin care Treatment Activities: Skin care regimen initiated : 10/17/2020 Notes: Electronic Signature(s) Signed: 01/15/2021 3:32:14 PM By: Phillis HaggisSanchez Pereyda, Dondra PraderKenia RN Entered By: Phillis HaggisSanchez Pereyda, Dondra PraderKenia on 01/15/2021 14:49:53 [name], Koal A. ([id number]) -------------------------------------------------------------------------------- Pain Assessment Details Patient Name: [name], Jeran A. Date of Service:  01/15/2021 1:45 PM Medical Record Number: [id number] Patient Account Number: [id number] Date of Birth/Sex: October 08, 1931 (87 y.o. M) Treating RN: Rogers BlockerSanchez, Kenia Primary Care Orlene Salmons: Marcelino DusterJohnston, John Other Clinician: Referring Dezmond Downie: Marcelino DusterJohnston, John Treating Nehemias Sauceda/Extender: Rowan BlaseStone, Hoyt Weeks in Treatment: 12 Active Problems Location of Pain Severity and Description of Pain Patient Has Paino No Site Locations Rate the pain. Current Pain Level: 0 Pain Management and Medication Current Pain Management: Electronic Signature(s) Signed: 01/15/2021 3:32:14 PM By: Phillis HaggisSanchez Pereyda, Dondra PraderKenia RN Entered By: Phillis HaggisSanchez Pereyda, Kenia on 01/15/2021 14:07:36 [name], Governor A. ([id number]) -------------------------------------------------------------------------------- Patient/Caregiver Education Details Patient Name: [name], Quantarius A. Date of Service: 01/15/2021 1:45 PM Medical Record Number: [id number] Patient Account Number: [id number] Date of Birth/Gender: October 08, 1931 (87 y.o. M) Treating RN: Rogers BlockerSanchez, Kenia Primary Care Physician: Marcelino DusterJohnston, John Other Clinician: Referring Physician: Marcelino DusterJohnston, John Treating Physician/Extender: Rowan BlaseStone, Hoyt Weeks in Treatment: 12 Education Assessment Education Provided To: Patient Education Topics Provided Wound/Skin Impairment: Methods: Explain/Verbal Responses: State content correctly Electronic Signature(s) Signed: 01/15/2021 3:32:14 PM By: Phillis HaggisSanchez Pereyda, Dondra PraderKenia RN Entered By: Phillis HaggisSanchez Pereyda, Dondra PraderKenia on 01/15/2021 14:57:50 [name], Ethel A. ([id number]) -------------------------------------------------------------------------------- Wound Assessment Details Patient Name:  Thinnes, Roberto A. Date of Service: 01/15/2021 1:45 PM Medical Record Number: [id number] Patient Account Number: [id number] Date of Birth/Sex: March 09, 1931 (87 y.o. M) Treating RN: Rogers Blocker Primary Care Naomi Castrogiovanni: [name] Other Clinician: Referring Eliah Marquard: [name] Treating  Senaya Dicenso/Extender: Allen Derry Weeks in Treatment: 12 Wound Status Wound Number: 11 Primary Venous Leg Ulcer Etiology: Wound Location: Right, Lateral Lower Leg Wound Open Wounding Event: Gradually Appeared Status: Date Acquired: 01/08/2021 Comorbid Cataracts, Sleep Apnea, Congestive Heart Failure, Weeks Of Treatment: 0 History: Coronary Artery Disease, Hypertension, Osteoarthritis Clustered Wound: No Photos Wound Measurements Length: (cm) 1.7 Width: (cm) 1 Depth: (cm) 0.1 Area: (cm) 1.335 Volume: (cm) 0.134 % Reduction in Area: 0% % Reduction in Volume: 0% Epithelialization: None Tunneling: No Undermining: No Wound Description Classification: Full Thickness Without Exposed Support Structures Exudate Amount: Medium Exudate Type: Sanguinous Exudate Color: red Foul Odor After Cleansing: No Slough/Fibrino No Wound Bed Granulation Amount: Large (67-100%) Exposed Structure Granulation Quality: Red Fascia Exposed: No Necrotic Amount: None Present (0%) Fat Layer (Subcutaneous Tissue) Exposed: Yes Tendon Exposed: No Muscle Exposed: No Joint Exposed: No Bone Exposed: No Treatment Notes Wound #11 (Lower Leg) Wound Laterality: Right, Lateral Cleanser Normal Saline Discharge Instruction: Wash your hands with soap and water. Remove old dressing, discard into plastic bag and place into trash. Cleanse the wound with Normal Saline prior to applying a clean dressing using gauze sponges, not tissues or cotton balls. Do not scrub or use excessive force. Pat dry using gauze sponges, not tissue or cotton balls. DONTAVIUS, KEIM A. ([id number]) Peri-Wound Care Topical Primary Dressing Silvercel Small 2x2 (in/in) Discharge Instruction: Apply Silvercel Small 2x2 (in/in) as instructed Secondary Dressing Gauze Discharge Instruction: As directed Secured With Kerlix Roll Sterile or Non-Sterile 6-ply 4.5x4 (yd/yd) Discharge Instruction: Apply Kerlix as directed Tubigrip Size F, 4x10  (in/yd) Discharge Instruction: Apply Tubigrip F 3 finger-widths below knee to base of toes to secure dressing and/or for swelling. Compression Wrap Compression Stockings Add-Ons Electronic Signature(s) Signed: 01/15/2021 3:32:14 PM By: Phillis Haggis, Dondra Prader RN Entered By: Phillis Haggis, Kenia on 01/15/2021 14:20:38 [name] ([id number]) -------------------------------------------------------------------------------- Wound Assessment Details Patient Name: [name] Date of Service: 01/15/2021 1:45 PM Medical Record Number: [id number] Patient Account Number: [id number] Date of Birth/Sex: November 11, 1931 (87 y.o. M) Treating RN: Rogers Blocker Primary Care Cheikh Bramble: [name] Other Clinician: Referring Myleah Cavendish: [name] Treating Clea Dubach/Extender: Allen Derry Weeks in Treatment: 12 Wound Status Wound Number: 8 Primary Venous Leg Ulcer Etiology: Wound Location: Left, Medial Lower Leg Wound Open Wounding Event: Gradually Appeared Status: Date Acquired: 09/25/2020 Comorbid Cataracts, Sleep Apnea, Congestive Heart Failure, Weeks Of Treatment: 12 History: Coronary Artery Disease, Hypertension, Osteoarthritis Clustered Wound: No Photos Wound Measurements Length: (cm) 0.5 Width: (cm) 0.4 Depth: (cm) 0.2 Area: (cm) 0.157 Volume: (cm) 0.031 % Reduction in Area: 77.8% % Reduction in Volume: 56.3% Epithelialization: Medium (34-66%) Tunneling: No Undermining: No Wound Description Classification: Full Thickness Without Exposed Support Structures Wound Margin: Flat and Intact Exudate Amount: Large Exudate Type: Serous Exudate Color: amber Foul Odor After Cleansing: No Slough/Fibrino No Wound Bed Granulation Amount: Medium (34-66%) Exposed Structure Granulation Quality: Pink Fascia Exposed: No Necrotic Amount: None Present (0%) Fat Layer (Subcutaneous Tissue) Exposed: Yes Tendon Exposed: No Muscle Exposed: No Joint Exposed: No Bone Exposed:  No Treatment Notes Wound #8 (Lower Leg) Wound Laterality: Left, Medial Cleanser Normal Saline Discharge Instruction: Wash your hands with soap and water. Remove old dressing, discard into plastic bag and place into trash. Cleanse the wound with Normal Saline prior  to applying a clean dressing using gauze sponges, not tissues or cotton balls. Do not scrub or use excessive force. Pat dry using gauze sponges, not tissue or cotton balls. ABEL, HAGEMAN A. ([id number]) Peri-Wound Care Topical Primary Dressing Silvercel Small 2x2 (in/in) Discharge Instruction: Apply Silvercel Small 2x2 (in/in) as instructed Secondary Dressing Gauze Discharge Instruction: As directed Secured With Kerlix Roll Sterile or Non-Sterile 6-ply 4.5x4 (yd/yd) Discharge Instruction: Apply Kerlix as directed Tubigrip Size F, 4x10 (in/yd) Discharge Instruction: Apply Tubigrip F 3 finger-widths below knee to base of toes to secure dressing and/or for swelling. Compression Wrap Compression Stockings Add-Ons Electronic Signature(s) Signed: 01/15/2021 3:32:14 PM By: Phillis Haggis, Dondra Prader RN Entered By: Phillis Haggis, Kenia on 01/15/2021 14:21:15 [name] ([id number]) -------------------------------------------------------------------------------- Vitals Details Patient Name: [name] Date of Service: 01/15/2021 1:45 PM Medical Record Number: [id number] Patient Account Number: [id number] Date of Birth/Sex: 1931/02/25 (87 y.o. M) Treating RN: Yevonne Pax Primary Care Erendida Wrenn: [name] Other Clinician: Referring Deashia Soule: [name] Treating Passion Lavin/Extender: Rowan Blase in Treatment: 12 Vital Signs Time Taken: 14:00 Temperature (F): 98.2 Pulse (bpm): 71 Respiratory Rate (breaths/min): 18 Blood Pressure (mmHg): 147/73 Reference Range: 80 - 120 mg / dl Electronic Signature(s) Signed: 01/15/2021 4:30:05 PM By: Dayton Martes RCP, RRT, CHT Entered By: Dayton Martes on 01/15/2021 14:00:20

## 2021-01-15 NOTE — Progress Notes (Addendum)
[name] ([id number]) Visit Report for 01/15/2021 Chief Complaint Document Details Patient Name: [name], [name] Date of Service: 01/15/2021 1:45 PM Medical Record Number: [id number] Patient Account Number: [id number] Date of Birth/Sex: Jul 12, 1931 (87 y.o. M) Treating RN: Yevonne Pax Primary Care Provider: [name] Other Clinician: Referring Provider: [name] Treating Provider/Extender: Rowan Blase in Treatment: 12 Information Obtained from: Patient Chief Complaint Left Medical LE Ulcers Electronic Signature(s) Signed: 01/15/2021 2:33:18 PM By: Lenda Kelp PA-C Entered By: Lenda Kelp on 01/15/2021 14:33:18 [name] Hilts A. ([id number]) -------------------------------------------------------------------------------- HPI Details Patient Name: [name] Hilts A. Date of Service: 01/15/2021 1:45 PM Medical Record Number: [id number] Patient Account Number: [id number] Date of Birth/Sex: 1931-06-18 (87 y.o. M) Treating RN: Yevonne Pax Primary Care Provider: [name] Other Clinician: Referring Provider: [name] Treating Provider/Extender: Rowan Blase in Treatment: 12 History of Present Illness HPI Description: 04/21/2019 ADMISSION This is an independent 87 year old man who lives in the independent part of [address] [place]. He has a history of chronic lower extremity edema and wears compression stockings. He states about a month ago he was taking off the one on the right and pulled some skin off accidentally. He has had 2 small wounds on the right anterior and right medial lower extremity. I note looking through [facility] link that he had multiple venous ultrasounds in 2015 and 16. These were DVT rule outs. He did have a Baker's cyst on the left. He has not not had a previous wound history although he did have a history of cellulitis in his legs. Past medical history; includes aortic stenosis status post mechanical AVR in 2007 on  chronic Coumadin, lower extremity edema with a history of cellulitis, history of MRSA. ABIs in our clinic were 1.1 on the right 6/3; patient with predominantly venous insufficiency ulcers in the right lower leg probably some degree of lymphedema. He had to wounds last week. We put him in 3 layer compression. The nurses at [facility] are changing the dressing. The area laterally is healed but he still has a small very painful area on the anterior tibial area. He is on Coumadin because of mechanical aortic valve. 6/10; venous insufficiency ulcers on the right lower leg. He has some degree of lymphedema. We have been using 3 layer compression with silver collagen small wound. Dimensions are improved. I gave him doxycycline last week which he tolerated because of surrounding erythema. This is improved also 6/17; arrives in clinic today with portable silver collagen dressing tightly adherent to the wound. Also felt that the wrap was too tight 3 layer being changed at the [facility] 6/24; patient's wound is small still adherent debris over the surface however even under illumination it was hard to see what was still open here. We have been using silver collagen 7/1; the patient arrives in the clinic today and the area on the right lower leg is healed. He has severe chronic venous insufficiency. He has 20/30 compression stockings. Readmission: 06/22/2020 on evaluation today patient presents for reevaluation here in our clinic although it has been a little over a year since we last saw him. He does have a history of lymphedema, venous insufficiency, anticoagulant therapy, he is on a pacemaker which is the reason for the anticoagulants, hypertension, and congestive heart failure. With that being said unfortunately he has been dealing with a wound of the left and right legs which has been giving him some trouble since arrival first 2021. That is in  regard to the left leg ulcer. In regard  to the right leg ulcer this is just a very small area that really I think will seal up quite nicely is more of the lymphedema opening than anything else. With that being said unfortunately the left leg is quite significant. We actually have noted that the patient's been dealing with this for quite some time and I think that there is a chance we may want to consider biopsy. With that being said he is on Coumadin therefore we were not able to perform a biopsy at this point. I think that something however in the future that may be warranted we may just have to take him off of the current Coumadin regimen in order to do this. We discussed doing it today but I am more concerned about the fact that he could have issues with uncontrolled bleeding and in that case we may have to send him to the ER. His daughter was not wanting to take that chance which I can completely understand. Nonetheless so far they have been using antibiotic ointment over the area I am just putting a protective dressing I do believe the patient is that he needs some type of compression. 07/07/2020 on evaluation today patient appears to be doing poorly in regard to his bilateral lower extremities today. He actually went to urgent care last night due to having pain in his legs due to the wraps. It appears they got somewhat tight on him because of the fact that he is having such significant swelling. He appears to be fluid overloaded I do not see any signs of infection actively at this point which is good news but unfortunately I think that this is something that may need to be addressed even by cardiology more so than just with a different or alternate type of wrap. I will contact his cardiologist, Dr. Mariah Milling, as well. 07/10/2020 upon evaluation today patient unfortunately appears to be doing worse even than when I saw him on Friday from the standpoint of where his legs stand. The main issue is that he has increased erythema although there  is still not warm to touch I am concerned about infection and about this getting significantly worse. Again my initial concern was more fluid overload and I discussed this with his cardiology office on Friday. With that being said I feel like this has gotten worse his daughter is in agreement and I think that the best option would be to send him to the hospital at this point. READMISSION 09/06/2020 Is a patient who has had 2 stays in this clinic firstly in 2020 and more recently from 06/02/2020 through 07/10/2020. He was cared for by [facility]. When he was last here he had bilateral lower extremity ulcers in the setting of chronic venous insufficiency with lymphedema. When he we last saw him he had weeping edema fluido Cellulitis and he was sent to the hospital. He was admitted initially from 06/30/2020 through 07/14/2020 with cellulitis plus stasis dermatitis. Treated empirically with vancomycin and had increases in his torsemide. He was then sent to the skilled level of [facility] but readmitted to hospital from 07/29/2020 through 08/10/2020 with blood loss anemia hemoglobin of 4 and OB positive stools. During this hospitalization he was noted to have a sacral wound at stage II. He was sent to [facility] [place] again in the skilled level. He had compression on his legs with Coban. He is now back at his independent living setting. He  has not been wearing compression and his daughter feels there is already increasing swelling. To have stockings at home not exactly sure at what strength. [name], [name] ([id number]) Past medical history includes lymphedema, chronic venous insufficiency, pacemaker, congestive heart failure, hypertension, aortic valve stenosis status post mechanical valve replacement in 2002 on chronic Coumadin, chronic kidney disease stage IIIb, atrial fibrillation, peripheral neuropathy, large hiatal hernia. We did not test his ABIs in the clinic  today. Readmission: 10/17/2020 on evaluation today patient appears to be doing somewhat poorly in regard to his left leg and the fact that he does have an open wound at this point. He has not had the compression socks placed at the facility they have not been putting them on at all unfortunately. He does not have on the right leg currently and apparently there is significant put him on the left leg with the wound which I understand is that can be somewhat tight obviously. Nonetheless I do believe the right leg needs to have a compression sock and I do believe on the left side right now we can do something a little better in order to try to get the wound healed at this point. The patient is on torsemide and currently allopurinol for gout. He also has daily weights due to congestive heart failure I am assuming in order to monitor his fluid status overall. He is currently a DNR at the facility. 10/30/2020 upon evaluation today patient appears to be doing somewhat poorly at this time in regard to his bilateral lower extremities. His left leg which is the only place where he had a wound last week when I saw him is actually doing worse with new wound openings. The right leg also has a small wound as well. However the most concerning thing is that last week he did not have any signs of infection or erythema there is no warmth to touch over the leg. This week the entire leg is more sore he also has erythema extending from the ankle to just below the knee with the area being erythematous and warm to touch which I believe represents cellulitis at minimum. I am concerned about how this is spread so quickly and the fact that he is having increased pain and also not feeling as well. All in all I discussed with the patient that unfortunately I feel like he may be best served going to the ER for further evaluation of this issue currently to try to see about the potential for IV antibiotics and to have  appropriate lab work performed. His daughter is present during the visit today and she was included in the decision making at this point. 11/13/2020 upon evaluation today patient appears to be doing well with regard to his leg ulcer. Fortunately he does not appear to be showing signs of infection anything like it was previous. This is great news. He was sent home with IV antibiotic therapy that seems to have done extremely well for him. No fevers, chills, nausea, vomiting, or diarrhea. 11/27/2020 upon evaluation today patient appears to be doing quite well in regard to his wounds currently. Fortunately there is no signs of active infection at this time which is great news and I am very pleased in this regard. He did have some concerns or his daughter did about an area on his gluteal region. With that being said I see nothing that is actually open at this point which is great news I do think continuing with a derma cloud  is a good way to go. With regard to his leg he does have a new area on the right leg of open wound locations which is new compared to last visit we will need to address this as well. Otherwise he seems to be doing quite well in my opinion. 12/15/2020 upon evaluation today patient appears to be doing well with regard to his leg ulcer on the left. With that being said he did not have the Tubigrip on which I think is integral to getting this closed as he continues to weep. Subsequently I think the Tubigrip will help control some of the edema which will help him in that regard. Fortunately there is no signs of active infection at this time. No fevers, chills, nausea, vomiting, or diarrhea. 01/01/2021 upon evaluation today patient appears to be doing well with regard to his wound. This is measuring better and actually is significantly smaller. Fortunately there is no signs of active infection at this time. No fevers, chills, nausea, vomiting, or diarrhea. 01/15/2021 on evaluation today patient  appears to be doing well with regard to his left leg ulcer though he still continues to weep quite a bit he did not have the Tubigrip on the day that he supposed to. Fortunately there is no signs of active infection at this time. No fevers, chills, nausea, vomiting, or diarrhea. Electronic Signature(s) Signed: 01/15/2021 3:01:09 PM By: Lenda Kelp PA-C Entered By: Lenda Kelp on 01/15/2021 15:01:08 [name] ([id number]) -------------------------------------------------------------------------------- Physical Exam Details Patient Name: [name] Hilts A. Date of Service: 01/15/2021 1:45 PM Medical Record Number: [id number] Patient Account Number: [id number] Date of Birth/Sex: 04-Apr-1931 (87 y.o. M) Treating RN: Yevonne Pax Primary Care Provider: [name] Other Clinician: Referring Provider: [name] Treating Provider/Extender: Rowan Blase in Treatment: 12 Constitutional Well-nourished and well-hydrated in no acute distress. Respiratory normal breathing without difficulty. Psychiatric this patient is able to make decisions and demonstrates good insight into disease process. Alert and Oriented x 3. pleasant and cooperative. Notes Upon inspection patient's wound bed actually showed signs of good granulation epithelization at this point. There does not appear to be any evidence of active infection which is great news and overall very pleased with where things stand today. No fevers, chills, nausea, vomiting, or diarrhea. With that being said he does have a wound on the right leg again now and though has been wearing a compression sock I think going back to the Tubigrip is probably best. Electronic Signature(s) Signed: 01/15/2021 3:01:36 PM By: Lenda Kelp PA-C Entered By: Lenda Kelp on 01/15/2021 15:01:36 [name] Hilts A. ([id number]) -------------------------------------------------------------------------------- Physician Orders Details Patient  Name: [name] Hilts A. Date of Service: 01/15/2021 1:45 PM Medical Record Number: [id number] Patient Account Number: [id number] Date of Birth/Sex: 1931-06-19 (87 y.o. M) Treating RN: Rogers Blocker Primary Care Provider: [name] Other Clinician: Referring Provider: [name] Treating Provider/Extender: Rowan Blase in Treatment: 12 Verbal / Phone Orders: No Diagnosis Coding ICD-10 Coding Code Description I89.0 Lymphedema, not elsewhere classified I87.2 Venous insufficiency (chronic) (peripheral) L97.822 Non-pressure chronic ulcer of other part of left lower leg with fat layer exposed L97.812 Non-pressure chronic ulcer of other part of right lower leg with fat layer exposed Z79.01 Long term (current) use of anticoagulants Z95.0 Presence of cardiac pacemaker I10 Essential (primary) hypertension I50.42 Chronic combined systolic (congestive) and diastolic (congestive) heart failure I25.10 Atherosclerotic heart disease of native coronary artery without angina pectoris Follow-up Appointments o Return Appointment in 2 weeks. Bathing/ [employer]  Wound #11 Right,Lateral Lower Leg o Clean wound with Normal Saline or wound cleanser. Wound #8 Left,Medial Lower Leg o Clean wound with Normal Saline or wound cleanser. Edema Control - Lymphedema / Segmental Compressive Device / Other Bilateral Lower Extremities o Tubigrip single layer applied. o Elevate legs to the level of the heart and pump ankles as often as possible o Elevate leg(s) parallel to the floor when sitting. Wound Treatment Wound #11 - Lower Leg Wound Laterality: Right, Lateral Cleanser: Normal Saline (Generic) 3 x Per Week/30 Days Discharge Instructions: Wash your hands with soap and water. Remove old dressing, discard into plastic bag and place into trash. Cleanse the wound with Normal Saline prior to applying a clean dressing using gauze sponges, not tissues or cotton balls. Do not scrub or use  excessive force. Pat dry using gauze sponges, not tissue or cotton balls. Primary Dressing: Silvercel Small 2x2 (in/in) (Generic) 3 x Per Week/30 Days Discharge Instructions: Apply Silvercel Small 2x2 (in/in) as instructed Secondary Dressing: Gauze 3 x Per Week/30 Days Discharge Instructions: As directed Secured With: Kerlix Roll Sterile or Non-Sterile 6-ply 4.5x4 (yd/yd) (Generic) 3 x Per Week/30 Days Discharge Instructions: Apply Kerlix as directed Secured With: Tubigrip Size F, 4x10 (in/yd) (Generic) 3 x Per Week/30 Days Discharge Instructions: Apply Tubigrip F 3 finger-widths below knee to base of toes to secure dressing and/or for swelling. Wound #8 - Lower Leg Wound Laterality: Left, Medial Cleanser: Normal Saline (Generic) 3 x Per Week/30 Days Discharge Instructions: Wash your hands with soap and water. Remove old dressing, discard into plastic bag and place into trash. Cleanse the wound with Normal Saline prior to applying a clean dressing using gauze sponges, not tissues or cotton balls. Do not scrub or use excessive force. Pat dry using gauze sponges, not tissue or cotton balls. [name], [name] ([id number]) Primary Dressing: Silvercel Small 2x2 (in/in) (Generic) 3 x Per Week/30 Days Discharge Instructions: Apply Silvercel Small 2x2 (in/in) as instructed Secondary Dressing: Gauze 3 x Per Week/30 Days Discharge Instructions: As directed Secured With: Kerlix Roll Sterile or Non-Sterile 6-ply 4.5x4 (yd/yd) (Generic) 3 x Per Week/30 Days Discharge Instructions: Apply Kerlix as directed Secured With: Tubigrip Size F, 4x10 (in/yd) (Generic) 3 x Per Week/30 Days Discharge Instructions: Apply Tubigrip F 3 finger-widths below knee to base of toes to secure dressing and/or for swelling. Electronic Signature(s) Signed: 01/15/2021 3:32:14 PM By: Phillis Haggis, Dondra Prader RN Signed: 01/15/2021 5:46:59 PM By: Lenda Kelp PA-C Entered By: Phillis Haggis, Dondra Prader on 01/15/2021 14:56:48 [name] Hilts A. ([id number]) -------------------------------------------------------------------------------- Problem List Details Patient Name: [name] Hilts A. Date of Service: 01/15/2021 1:45 PM Medical Record Number: [id number] Patient Account Number: [id number] Date of Birth/Sex: 1931-03-27 (87 y.o. M) Treating RN: Yevonne Pax Primary Care Provider: [name] Other Clinician: Referring Provider: [name] Treating Provider/Extender: Rowan Blase in Treatment: 12 Active Problems ICD-10 Encounter Code Description Active Date MDM Diagnosis I89.0 Lymphedema, not elsewhere classified 10/17/2020 No Yes I87.2 Venous insufficiency (chronic) (peripheral) 10/17/2020 No Yes L97.822 Non-pressure chronic ulcer of other part of left lower leg with fat layer 10/17/2020 No Yes exposed L97.812 Non-pressure chronic ulcer of other part of right lower leg with fat layer 10/17/2020 No Yes exposed Z79.01 Long term (current) use of anticoagulants 10/17/2020 No Yes Z95.0 Presence of cardiac pacemaker 10/17/2020 No Yes I10 Essential (primary) hypertension 10/17/2020 No Yes I50.42 Chronic combined systolic (congestive) and diastolic (congestive) heart 10/17/2020 No Yes failure I25.10 Atherosclerotic heart disease of native coronary artery without angina 10/17/2020 No  Yes pectoris Inactive Problems Resolved Problems Electronic Signature(s) Signed: 01/15/2021 2:33:13 PM By: Lenda KelpStone III, Lochlann Mastrangelo PA-C Entered By: Lenda KelpStone III, Calliope Delangel on 01/15/2021 14:33:13 [name], [name] ([id number]) -------------------------------------------------------------------------------- Progress Note Details Patient Name: [name], Sebron A. Date of Service: 01/15/2021 1:45 PM Medical Record Number: [id number] Patient Account Number: [id number] Date of Birth/Sex: [birth year] (87 y.o. M) Treating RN: Yevonne PaxEpps, Carrie Primary Care Provider: Marcelino DusterJohnston, John Other Clinician: Referring Provider: Marcelino DusterJohnston, John Treating  Provider/Extender: Rowan BlaseStone, Seve Monette Weeks in Treatment: 12 Subjective Chief Complaint Information obtained from Patient Left Medical LE Ulcers History of Present Illness (HPI) 04/21/2019 ADMISSION This is an independent 87 year old man who lives [place] the [place] part [place] [address]. He has a history of chronic lower extremity edema and wears compression stockings. He states about a month ago he was taking off the one on the right and pulled some skin off accidentally. He has had 2 small wounds on the right anterior and right medial lower extremity. I [facility] that he had multiple venous ultrasounds in 2015 and 16. These were DVT rule outs. He did have a Baker's cyst on the left. He has not not had a previous wound history although he did have a history of cellulitis in his legs. Past medical history; includes aortic stenosis status post mechanical AVR in 2007 on chronic Coumadin, lower extremity edema with a history of cellulitis, history of MRSA. ABIs in our clinic were 1.1 on the right 6/3; patient with predominantly venous insufficiency ulcers in the right lower leg probably some degree of lymphedema. He had to wounds last week. We put him in 3 layer compression. [facility] [place] [facility]. The area laterally is healed but he still has a small very painful area on the anterior tibial area. He is on Coumadin because of mechanical aortic valve. 6/10; venous insufficiency ulcers on the right lower leg. He has some degree of lymphedema. We have been using 3 layer compression with silver collagen small wound. Dimensions are improved. I gave him doxycycline last week which he tolerated because of surrounding erythema. This is improved also 6/17; arrives in clinic today with portable silver collagen dressing tightly adherent to the wound. Also felt that the wrap was too tight 3 [facility]  [place] [facility]; patient's wound is small still adherent debris over the surface however even under illumination it was hard to see what was still open here. We have been using silver collagen 7/1; the patient arrives in the clinic today and the area on the right lower leg is healed. He has severe chronic venous insufficiency. He has 20/30 compression stockings. Readmission: 06/22/2020 on evaluation today patient presents for reevaluation here in our clinic although it has been a little over a year since we last saw him. He does have a history of lymphedema, venous insufficiency, anticoagulant therapy, he is on a pacemaker which is the reason for the anticoagulants, hypertension, and congestive heart failure. With that being said unfortunately he has been dealing with a wound of the left and right legs which has been giving him some trouble since arrival first 2021. That is in regard to the left leg ulcer. In regard to the right leg ulcer this is just a very small area that really I think will seal up quite nicely is more of the lymphedema opening than anything else. With that being said unfortunately the left leg is quite significant. We actually have noted  that the patient's been dealing with this for quite some time and I think that there is a chance we may want to consider biopsy. With that being said he is on Coumadin therefore we were not able to perform a biopsy at this point. I think that something however in the future that may be warranted we may just have to take him off of the current Coumadin regimen in order to do this. We discussed doing it today but I am more concerned about the fact that he could have issues with uncontrolled bleeding and in that case we may have to send him to the ER. His daughter was not wanting to take that chance which I can completely understand. Nonetheless so far they have been using antibiotic ointment over the area I am just putting a protective dressing I do  believe the patient is that he needs some type of compression. 07/07/2020 on evaluation today patient appears to be doing poorly in regard to his bilateral lower extremities today. He actually went to urgent care last night due to having pain in his legs due to the wraps. It appears they got somewhat tight on him because of the fact that he is having such significant swelling. He appears to be fluid overloaded I do not see any signs of infection actively at this point which is good news but unfortunately I think that this is something that may need to be addressed even by cardiology more so than just with a different or alternate type of wrap. I will contact his cardiologist, Dr. Mariah Milling, as well. 07/10/2020 upon evaluation today patient unfortunately appears to be doing worse even than when I saw him on Friday from the standpoint of where his legs stand. The main issue is that he has increased erythema although there is still not warm to touch I am concerned about infection and about this getting significantly worse. Again my initial concern was more fluid overload and I discussed this with his cardiology office on Friday. With that being said I feel like this has gotten worse his daughter is in agreement and I think that the best option would be to send him to the hospital at this point. READMISSION 09/06/2020 Is a patient who has had 2 stays in this clinic firstly in 2020 and more recently from 06/02/2020 through 07/10/2020. He was cared for by [facility]. When he was last here he had bilateral lower extremity ulcers in the setting of chronic venous insufficiency with lymphedema. When he we last saw him he had weeping edema fluido Cellulitis and he was sent to the hospital. He was admitted initially from 06/30/2020 through 07/14/2020 [facility], Baily A. ([id number]) with cellulitis plus stasis dermatitis. Treated empirically with vancomycin and had increases in his torsemide. He was then sent to the skilled  level of [facility] but readmitted to hospital from 07/29/2020 through 08/10/2020 with blood loss anemia hemoglobin of 4 and OB positive stools. During this hospitalization he was noted to have a sacral wound at stage II. He was sent to [facility] [place] again in the skilled level. He had compression on his legs with Coban. He is now back at his independent living setting. He has not been wearing compression and his daughter feels there is already increasing swelling. To have stockings at home not exactly sure at what strength. Past medical history includes lymphedema, chronic venous insufficiency, pacemaker, congestive heart failure, hypertension, aortic valve stenosis status post mechanical valve replacement in 2002 on  chronic Coumadin, chronic kidney disease stage IIIb, atrial fibrillation, peripheral neuropathy, large hiatal hernia. We did not test his ABIs in the clinic today. Readmission: 10/17/2020 on evaluation today patient appears to be doing somewhat poorly in regard to his left leg and the fact that he does have an open wound at this point. He has not had the compression socks placed at the facility they have not been putting them on at all unfortunately. He does not have on the right leg currently and apparently there is significant put him on the left leg with the wound which I understand is that can be somewhat tight obviously. Nonetheless I do believe the right leg needs to have a compression sock and I do believe on the left side right now we can do something a little better in order to try to get the wound healed at this point. The patient is on torsemide and currently allopurinol for gout. He also has daily weights due to congestive heart failure I am assuming in order to monitor his fluid status overall. He is currently a DNR at the facility. 10/30/2020 upon evaluation today patient appears to be doing somewhat poorly at this time in regard to his bilateral lower  extremities. His left leg which is the only place where he had a wound last week when I saw him is actually doing worse with new wound openings. The right leg also has a small wound as well. However the most concerning thing is that last week he did not have any signs of infection or erythema there is no warmth to touch over the leg. This week the entire leg is more sore he also has erythema extending from the ankle to just below the knee with the area being erythematous and warm to touch which I believe represents cellulitis at minimum. I am concerned about how this is spread so quickly and the fact that he is having increased pain and also not feeling as well. All in all I discussed with the patient that unfortunately I feel like he may be best served going to the ER for further evaluation of this issue currently to try to see about the potential for IV antibiotics and to have appropriate lab work performed. His daughter is present during the visit today and she was included in the decision making at this point. 11/13/2020 upon evaluation today patient appears to be doing well with regard to his leg ulcer. Fortunately he does not appear to be showing signs of infection anything like it was previous. This is great news. He was sent home with IV antibiotic therapy that seems to have done extremely well for him. No fevers, chills, nausea, vomiting, or diarrhea. 11/27/2020 upon evaluation today patient appears to be doing quite well in regard to his wounds currently. Fortunately there is no signs of active infection at this time which is great news and I am very pleased in this regard. He did have some concerns or his daughter did about an area on his gluteal region. With that being said I see nothing that is actually open at this point which is great news I do think continuing with a derma cloud is a good way to go. With regard to his leg he does have a new area on the right leg of open wound locations  which is new compared to last visit we will need to address this as well. Otherwise he seems to be doing quite well in my opinion. 12/15/2020 upon  evaluation today patient appears to be doing well with regard to his leg ulcer on the left. With that being said he did not have the Tubigrip on which I think is integral to getting this closed as he continues to weep. Subsequently I think the Tubigrip will help control some of the edema which will help him in that regard. Fortunately there is no signs of active infection at this time. No fevers, chills, nausea, vomiting, or diarrhea. 01/01/2021 upon evaluation today patient appears to be doing well with regard to his wound. This is measuring better and actually is significantly smaller. Fortunately there is no signs of active infection at this time. No fevers, chills, nausea, vomiting, or diarrhea. 01/15/2021 on evaluation today patient appears to be doing well with regard to his left leg ulcer though he still continues to weep quite a bit he did not have the Tubigrip on the day that he supposed to. Fortunately there is no signs of active infection at this time. No fevers, chills, nausea, vomiting, or diarrhea. Objective Constitutional Well-nourished and well-hydrated in no acute distress. Vitals Time Taken: 2:00 PM, Temperature: 98.2 F, Pulse: 71 bpm, Respiratory Rate: 18 breaths/min, Blood Pressure: 147/73 mmHg. Respiratory normal breathing without difficulty. Psychiatric this patient is able to make decisions and demonstrates good insight into disease process. Alert and Oriented x 3. pleasant and cooperative. General Notes: Upon inspection patient's wound bed actually showed signs of good granulation epithelization at this point. There does not appear to be any evidence of active infection which is great news and overall very pleased with where things stand today. No fevers, chills, nausea, [name], [name] ([id number]) vomiting, or diarrhea. With  that being said he does have a wound on the right leg again now and though has been wearing a compression sock I think going back to the Tubigrip is probably best. Integumentary (Hair, Skin) Wound #11 status is Open. Original cause of wound was Gradually Appeared. The date acquired was: 01/08/2021. The wound is located on the Right,Lateral Lower Leg. The wound measures 1.7cm length x 1cm width x 0.1cm depth; 1.335cm^2 area and 0.134cm^3 volume. There is Fat Layer (Subcutaneous Tissue) exposed. There is no tunneling or undermining noted. There is a medium amount of sanguinous drainage noted. There is large (67-100%) red granulation within the wound bed. There is no necrotic tissue within the wound bed. Wound #8 status is Open. Original cause of wound was Gradually Appeared. The date acquired was: 09/25/2020. The wound has been in treatment 12 weeks. The wound is located on the Left,Medial Lower Leg. The wound measures 0.5cm length x 0.4cm width x 0.2cm depth; 0.157cm^2 area and 0.031cm^3 volume. There is Fat Layer (Subcutaneous Tissue) exposed. There is no tunneling or undermining noted. There is a large amount of serous drainage noted. The wound margin is flat and intact. There is medium (34-66%) pink granulation within the wound bed. There is no necrotic tissue within the wound bed. 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 Bathing/  Shower/ Hygiene: Wound #11 Right,Lateral Lower Leg: Clean wound with Normal Saline  or wound cleanser. Wound #8 Left,Medial Lower Leg: Clean wound with Normal Saline or wound cleanser. Edema Control - Lymphedema / Segmental Compressive Device / Other: Tubigrip single layer applied. Elevate legs to the level of the heart and pump ankles as often as possible Elevate leg(s) parallel to the floor when sitting. WOUND #11: - Lower Leg Wound Laterality: Right, Lateral Cleanser: Normal Saline (Generic) 3 x Per Week/30 Days Discharge Instructions: Wash your hands with soap and water. Remove old dressing, discard into plastic bag and place into trash. Cleanse the wound with Normal Saline prior to applying a clean dressing using gauze sponges, not tissues or cotton balls. Do not scrub or use excessive force. Pat dry using gauze sponges, not tissue or cotton balls. Primary Dressing: Silvercel Small 2x2 (in/in) (Generic) 3 x Per Week/30 Days Discharge Instructions: Apply Silvercel Small 2x2 (in/in) as instructed Secondary Dressing: Gauze 3 x Per Week/30 Days Discharge Instructions: As directed Secured With: Kerlix Roll Sterile or Non-Sterile 6-ply 4.5x4 (yd/yd) (Generic) 3 x Per Week/30 Days Discharge Instructions: Apply Kerlix as directed Secured With: Tubigrip Size F, 4x10 (in/yd) (Generic) 3 x Per Week/30 Days Discharge Instructions: Apply Tubigrip F 3 finger-widths below knee to base of toes to secure dressing and/or for swelling. WOUND #8: - Lower Leg Wound Laterality: Left, Medial Cleanser: Normal Saline (Generic) 3 x Per Week/30 Days Discharge Instructions: Wash your hands with soap and water. Remove old dressing, discard into plastic bag and place into trash. Cleanse the wound with Normal Saline prior to applying a clean dressing using gauze sponges, not tissues or cotton balls. Do not scrub or use excessive force. Pat dry using gauze sponges, not tissue or cotton balls. Primary Dressing:  Silvercel Small 2x2 (in/in) (Generic) 3 x Per Week/30 Days Discharge Instructions: Apply Silvercel Small 2x2 (in/in) as instructed Secondary Dressing: Gauze 3 x Per Week/30 Days Discharge Instructions: As directed Secured With: Kerlix Roll Sterile or Non-Sterile 6-ply 4.5x4 (yd/yd) (Generic) 3 x Per Week/30 Days Discharge Instructions: Apply Kerlix as directed Secured With: Tubigrip Size F, 4x10 (in/yd) (Generic) 3 x Per Week/30 Days Discharge Instructions: Apply Tubigrip F 3 finger-widths below knee to base of toes to secure dressing and/or for swelling. [name], [name] ([id number]) 1. Would recommend currently that we going continue with the wound care measures as before and the patient is in agreement with the plan that includes the use of a silver alginate dressing to both wound locations I think that still the best way to go. 2. I am also can recommend that we have the patient go ahead and continue with the Tubigrip to both legs which I think is good to help with edema control for the time being. Once he is healed we will go over to a compression sock we will get a get this healed first. We will see patient back for reevaluation in 1 week here in the clinic. If anything worsens or changes patient will contact our office for additional recommendations. Electronic Signature(s) Signed: 01/15/2021 3:02:50 PM By: Lenda Kelp PA-C Entered By: Lenda Kelp on 01/15/2021 15:02:50 [name] Hilts A. ([id number]) -------------------------------------------------------------------------------- SuperBill Details Patient Name: [name] Hilts A. Date of Service: 01/15/2021 Medical Record Number: [id number] Patient Account Number: [id number] Date of Birth/Sex: 03-06-1931 (87 y.o. M) Treating RN: Rogers Blocker Primary Care Provider: [name] Other Clinician: Referring Provider: [name] Treating Provider/Extender: Rowan Blase in Treatment: 12 Diagnosis Coding ICD-10  Codes Code Description I89.0 Lymphedema, not elsewhere classified I87.2 Venous insufficiency (chronic) (peripheral)  Z61.096 Non-pressure chronic ulcer of other part of left lower leg with fat layer exposed L97.812 Non-pressure chronic ulcer of other part of right lower leg with fat layer exposed Z79.01 Long term (current) use of anticoagulants Z95.0 Presence of cardiac pacemaker I10 Essential (primary) hypertension I50.42 Chronic combined systolic (congestive) and diastolic (congestive) heart failure I25.10 Atherosclerotic heart disease of native coronary artery without angina pectoris Facility Procedures CPT4 Code: [id number] Description: 99213 - WOUND CARE VISIT-LEV 3 EST PT Modifier: Quantity: 1 Physician Procedures CPT4 Code: 1914782 Description: 99213 - WC PHYS LEVEL 3 - EST PT Modifier: Quantity: 1 CPT4 Code: Description: ICD-10 Diagnosis Description I89.0 Lymphedema, not elsewhere classified I87.2 Venous insufficiency (chronic) (peripheral) L97.822 Non-pressure chronic ulcer of other part of left lower leg with fat lay L97.812 Non-pressure chronic ulcer of  other part of right lower leg with fat la Modifier: er exposed yer exposed Quantity: Electronic Signature(s) Signed: 01/15/2021 3:04:19 PM By: Lenda Kelp PA-C Entered By: Lenda Kelp on 01/15/2021 15:04:18

## 2021-01-16 ENCOUNTER — Ambulatory Visit: Payer: Medicare Other | Admitting: Physician Assistant

## 2021-01-16 ENCOUNTER — Other Ambulatory Visit
Admission: RE | Admit: 2021-01-16 | Discharge: 2021-01-16 | Disposition: A | Discharge status: Home / Self Care | Payer: Medicare Other | Source: Ambulatory Visit | Attending: Internal Medicine | Admitting: Internal Medicine

## 2021-01-16 ENCOUNTER — Telehealth: Payer: Self-pay | Admitting: Cardiovascular Disease

## 2021-01-16 DIAGNOSIS — I4891 Unspecified atrial fibrillation: Secondary | ICD-10-CM | POA: Insufficient documentation

## 2021-01-16 DIAGNOSIS — Z7901 Long term (current) use of anticoagulants: Secondary | ICD-10-CM | POA: Diagnosis present

## 2021-01-16 LAB — PROTIME-INR
INR: 3 — ABNORMAL HIGH (ref 0.8–1.2)
Prothrombin Time: 30.2 seconds — ABNORMAL HIGH (ref 11.4–15.2)

## 2021-01-16 NOTE — Telephone Encounter (Signed)
Returned a call to this pt; there was no answer so left a message to call back.   This pt was last seen in our [facility] on 09/13/2020 so I am unsure who has been following his INR and dosing his Warfarin. The pt would need to contact the managing office for advice.

## 2021-01-16 NOTE — Telephone Encounter (Signed)
If Home Health RN is calling please get Coumadin Nurse on the phone STAT  1.  Are you calling in regards to an appointment? no  2.  Are you calling for a refill ? no  3.  Are you having bleeding issues? no  4.  Do you need clearance to hold Coumadin? No  Patient calling for advise on how to adjust INR from 3 to a value of 2 before Monday.  Patient wants advise on dosing.   Please route to the Coumadin Clinic Pool

## 2021-01-19 NOTE — Telephone Encounter (Signed)
Pt is currently in the SNF @ [facility] where Dr. Dareen Piano is monitoring his INR and managing his warfarin. Pt was hoping to be there temporarily and return to our office, but he may be there longer than he initially intended.

## 2021-01-22 ENCOUNTER — Other Ambulatory Visit
Admission: RE | Admit: 2021-01-22 | Discharge: 2021-01-22 | Disposition: A | Discharge status: Home / Self Care | Payer: Medicare Other | Source: Ambulatory Visit | Attending: Internal Medicine | Admitting: Internal Medicine

## 2021-01-22 ENCOUNTER — Inpatient Hospital Stay: Admit: 2021-01-22 | Payer: Medicare Other

## 2021-01-22 DIAGNOSIS — Z7901 Long term (current) use of anticoagulants: Secondary | ICD-10-CM | POA: Diagnosis not present

## 2021-01-22 DIAGNOSIS — I4891 Unspecified atrial fibrillation: Secondary | ICD-10-CM | POA: Insufficient documentation

## 2021-01-22 LAB — PROTIME-INR
INR: 2.6 — ABNORMAL HIGH (ref 0.8–1.2)
Prothrombin Time: 27.3 seconds — ABNORMAL HIGH (ref 11.4–15.2)

## 2021-01-26 ENCOUNTER — Other Ambulatory Visit
Admission: RE | Admit: 2021-01-26 | Discharge: 2021-01-26 | Disposition: A | Discharge status: Home / Self Care | Payer: Medicare Other | Source: Skilled Nursing Facility | Attending: Internal Medicine | Admitting: Internal Medicine

## 2021-01-26 DIAGNOSIS — I4891 Unspecified atrial fibrillation: Secondary | ICD-10-CM | POA: Insufficient documentation

## 2021-01-26 LAB — PROTIME-INR
INR: 2.4 — ABNORMAL HIGH (ref 0.8–1.2)
Prothrombin Time: 25.1 seconds — ABNORMAL HIGH (ref 11.4–15.2)

## 2021-01-29 ENCOUNTER — Encounter: Payer: Medicare Other | Attending: Physician Assistant | Admitting: Physician Assistant

## 2021-01-29 ENCOUNTER — Other Ambulatory Visit: Payer: Self-pay

## 2021-01-29 DIAGNOSIS — I872 Venous insufficiency (chronic) (peripheral): Secondary | ICD-10-CM | POA: Insufficient documentation

## 2021-01-29 DIAGNOSIS — Z952 Presence of prosthetic heart valve: Secondary | ICD-10-CM | POA: Diagnosis not present

## 2021-01-29 DIAGNOSIS — L89152 Pressure ulcer of sacral region, stage 2: Secondary | ICD-10-CM | POA: Diagnosis not present

## 2021-01-29 DIAGNOSIS — Z950 Presence of cardiac pacemaker: Secondary | ICD-10-CM | POA: Diagnosis not present

## 2021-01-29 DIAGNOSIS — N1832 Chronic kidney disease, stage 3b: Secondary | ICD-10-CM | POA: Insufficient documentation

## 2021-01-29 DIAGNOSIS — Z7901 Long term (current) use of anticoagulants: Secondary | ICD-10-CM | POA: Insufficient documentation

## 2021-01-29 DIAGNOSIS — I2510 Atherosclerotic heart disease of native coronary artery without angina pectoris: Secondary | ICD-10-CM | POA: Insufficient documentation

## 2021-01-29 DIAGNOSIS — I890 Lymphedema, not elsewhere classified: Secondary | ICD-10-CM | POA: Insufficient documentation

## 2021-01-29 DIAGNOSIS — L97822 Non-pressure chronic ulcer of other part of left lower leg with fat layer exposed: Secondary | ICD-10-CM | POA: Diagnosis present

## 2021-01-29 DIAGNOSIS — I130 Hypertensive heart and chronic kidney disease with heart failure and stage 1 through stage 4 chronic kidney disease, or unspecified chronic kidney disease: Secondary | ICD-10-CM | POA: Insufficient documentation

## 2021-01-29 DIAGNOSIS — L97812 Non-pressure chronic ulcer of other part of right lower leg with fat layer exposed: Secondary | ICD-10-CM | POA: Diagnosis not present

## 2021-01-29 DIAGNOSIS — G629 Polyneuropathy, unspecified: Secondary | ICD-10-CM | POA: Insufficient documentation

## 2021-01-29 DIAGNOSIS — I5042 Chronic combined systolic (congestive) and diastolic (congestive) heart failure: Secondary | ICD-10-CM | POA: Diagnosis not present

## 2021-01-31 NOTE — Progress Notes (Signed)
[name], [name] ([id number]) Visit Report for 01/29/2021 Arrival Information Details Patient Name: [name], [name] Date of Service: 01/29/2021 11:15 AM Medical Record Number: [id number] Patient Account Number: [id number] Date of Birth/Sex: 08/15/1931 (87 y.o. M) Treating RN: [name] Primary Care [name]: [name] Other Clinician: Lolita [name] Referring [name]: [name] Treating [name]/Extender: [name] in Treatment: 14 Visit Information History Since Last Visit Added or deleted any medications: No Patient Arrived: Wheel Chair Had a fall or experienced change in No Arrival Time: 11:51 activities of daily living that may affect Accompanied By: daughter risk of falls: Transfer Assistance: [employer] Patient Hospitalized since last visit: No Lift Pain Present Now: Yes Patient Identification Verified: Yes Secondary Verification Process Completed: Yes Patient Requires Transmission-Based No Precautions: Patient Has Alerts: No Electronic Signature(s) Signed: 01/29/2021 1:04:35 PM By: [name] Entered By: [name] on 01/29/2021 11:56:03 [name], [name] ([id number]) -------------------------------------------------------------------------------- Clinic Level of Care Assessment Details Patient Name: [name] Hilts A. Date of Service: 01/29/2021 11:15 AM Medical Record Number: [id number] Patient Account Number: [id number] Date of Birth/Sex: October 28, 1931 (87 y.o. M) Treating RN: [name] Primary Care [name]: [name] Other Clinician: Lolita [name] Referring [name]: [name] Treating Pia Jedlicka/Extender: [name] in Treatment: 14 Clinic Level of Care Assessment Items TOOL 4 Quantity Score X - Use when only an EandM is performed on FOLLOW-UP visit 1 0 ASSESSMENTS - Nursing Assessment / Reassessment []  - Reassessment of Co-morbidities (includes updates in patient status) 0 []  - 0 Reassessment of Adherence to  Treatment Plan ASSESSMENTS - Wound and Skin Assessment / Reassessment X - Simple Wound Assessment / Reassessment - one wound 1 5 []  - 0 Complex Wound Assessment / Reassessment - multiple wounds []  - 0 Dermatologic / Skin Assessment (not related to wound area) ASSESSMENTS - Focused Assessment []  - Circumferential Edema Measurements - multi extremities 0 []  - 0 Nutritional Assessment / Counseling / Intervention []  - 0 Lower Extremity Assessment (monofilament, tuning fork, pulses) []  - 0 Peripheral Arterial Disease Assessment (using hand held doppler) ASSESSMENTS - Ostomy and/or Continence Assessment and Care []  - Incontinence Assessment and Management 0 []  - 0 Ostomy Care Assessment and Management (repouching, etc.) PROCESS - Coordination of Care X - Simple Patient / Family Education for ongoing care 1 15 []  - 0 Complex (extensive) Patient / Family Education for ongoing care []  - 0 Staff obtains , Records, Test Results / Process Orders []  - 0 Staff telephones HHA, Nursing Homes / Clarify orders / etc []  - 0 Routine Transfer to another Facility (non-emergent condition) []  - 0 Routine Hospital Admission (non-emergent condition) []  - 0 New Admissions / / Ordering NPWT, Apligraf, etc. []  - 0 Emergency Hospital Admission (emergent condition) X- 1 10 Simple Discharge Coordination []  - 0 Complex (extensive) Discharge Coordination PROCESS - Special Needs []  - Pediatric / Minor Patient Management 0 []  - 0 Isolation Patient Management []  - 0 Hearing / Language / Visual special needs []  - 0 Assessment of Community assistance (transportation, D/C planning, etc.) []  - 0 Additional assistance / Altered mentation []  - 0 Support Surface(s) Assessment (bed, cushion, seat, etc.) INTERVENTIONS - Wound Cleansing / Measurement [name], [name] ( ) []  - 0 Simple Wound Cleansing - one wound X- 2 5 Complex Wound Cleansing - multiple wounds X- 1  5 Wound Imaging (photographs - any number of wounds) []  - 0 Wound Tracing (instead of photographs) []  - 0 Simple Wound Measurement - one wound X- 2 5 Complex Wound Measurement -  multiple wounds INTERVENTIONS - Wound Dressings []  - Small Wound Dressing one or multiple wounds 0 X- 2 15 Medium Wound Dressing one or multiple wounds []  - 0 Large Wound Dressing one or multiple wounds X- 1 5 Application of Medications - topical []  - 0 Application of Medications - injection INTERVENTIONS - Miscellaneous []  - External ear exam 0 []  - 0 Specimen Collection (cultures, biopsies, blood, body fluids, etc.) []  - 0 Specimen(s) / Culture(s) sent or taken to Lab for analysis []  - 0 Patient Transfer (multiple staff / / Similar devices) []  - 0 Simple Staple / Suture removal (25 or less) []  - 0 Complex Staple / Suture removal (26 or more) []  - 0 Hypo / Hyperglycemic Management (close monitor of Blood Glucose) []  - 0 Ankle / Brachial Index (ABI) - do not check if billed separately X- 1 5 Vital Signs Has the patient been seen at the hospital within the last three years: Yes Total Score: 95 Level Of Care: New/Established - Level 3 Electronic Signature(s) Signed: 01/31/2021 12:09:52 PM By: RN Entered By: on 01/29/2021 12:48:20 A. ( ) -------------------------------------------------------------------------------- Encounter Discharge Information Details Patient Name: Nurse, [name] Date of Service: 01/29/2021 11:15 AM Medical Record Number: Patient Account Number: Date of Birth/Sex: September 13, 1931 (87 y.o. M) Treating RN: [name] Primary Care [name]: [name] Other Clinician: 03/31/2021 Referring Chloris Marcoux: [name] Hilts Treating Lauro Manlove/Extender: [id number] in Treatment: 14 Encounter Discharge Information Items Discharge Condition: Stable Ambulatory Status: Wheelchair Discharge  Destination: Home Transportation: Private Auto Accompanied By: self Schedule Follow-up Appointment: Yes Clinical Summary of Care: Patient Declined Electronic Signature(s) Signed: 01/31/2021 12:09:52 PM By: [id number] RN Entered By: [id number] on 01/29/2021 12:49:56 06-11-1977 A. ([name]) -------------------------------------------------------------------------------- Lower Extremity Assessment Details Patient Name: [name] A. Date of Service: 01/29/2021 11:15 AM Medical Record Number: [name] Patient Account Number: [name] Date of Birth/Sex: 1931-03-20 (87 y.o. M) Treating RN: [name] Primary Care Alanea Woolridge: [name] Other Clinician: 03/31/2021 Referring Regan Llorente: [name] Hilts Treating Cristen Murcia/Extender: [id number] Weeks in Treatment: 14 Edema Assessment Assessed: [Left: Yes] [Right: Yes] Edema: [Left: Yes] [Right: Yes] Calf Left: Right: Point of Measurement: 33 cm From Medial Instep 35.5 cm 35.5 cm Ankle Left: Right: Point of Measurement: 10 cm From Medial Instep 22 cm 22 cm Vascular Assessment Pulses: Dorsalis Pedis Palpable: [Left:Yes] [Right:Yes] Electronic Signature(s) Signed: 01/29/2021 1:04:35 PM By: 03/31/2021 Signed: 01/31/2021 12:09:52 PM By: [id number] RN Entered By: 09/14/1931 on 01/29/2021 12:14:32 Doty, Terrall A. ([name]) -------------------------------------------------------------------------------- Multi Wound Chart Details Patient Name: [name] A. Date of Service: 01/29/2021 11:15 AM Medical Record Number: [name] Patient Account Number: Allen Derry Date of Birth/Sex: 09-18-1931 (87 y.o. M) Treating RN: 04/02/2021 Primary Care Reyan Helle: [name] Other Clinician: Lolita [name] Referring Renna Kilmer: 03/31/2021 Treating Delontae Lamm/Extender: [id number] Weeks in Treatment: 14 Vital Signs Height(in): Pulse(bpm): 87 Weight(lbs): Blood Pressure(mmHg): 149/85 Body Mass  Index(BMI): Temperature(F): 98.1 Respiratory Rate(breaths/min): 16 Photos: Wound Location: Right, Lateral Lower Leg Sacrum Left, Medial Lower Leg Wounding Event: Gradually Appeared Pressure Injury Gradually Appeared Primary Etiology: Venous Leg Ulcer Pressure Ulcer Venous Leg Ulcer Comorbid History: Cataracts, Sleep Apnea, Congestive Cataracts, Sleep Apnea, Congestive Cataracts, Sleep Apnea, Congestive Heart Failure, Coronary Artery Heart Failure, Coronary Artery Heart Failure, Coronary Artery Disease, Hypertension, Disease, Hypertension, Disease, Hypertension, Osteoarthritis Osteoarthritis Osteoarthritis Date Acquired: 01/08/2021 01/18/2021 09/25/2020 Weeks of Treatment: 2 0 14 Wound Status: Healed - Epithelialized Open Open Measurements L x W x  D (cm) 0x0x0 0.6x0.7x0.3 0.3x0.3x0.2 Area (cm) : 0 0.33 0.071 Volume (cm) : 0 0.099 0.014 % Reduction in Area: 100.00% N/A 90.00% % Reduction in Volume: 100.00% N/A 80.30% Classification: Full Thickness Without Exposed Category/Stage II Full Thickness Without Exposed Support Structures Support Structures Exudate Amount: None Present Medium Small Exudate Type: N/A Serous Serous Exudate Color: N/A amber amber Wound Margin: N/A Flat and Intact Flat and Intact Granulation Amount: None Present (0%) Small (1-33%) Medium (34-66%) Granulation Quality: N/A Red Pink Necrotic Amount: None Present (0%) Small (1-33%) None Present (0%) Exposed Structures: Fascia: No Fat Layer (Subcutaneous Tissue): Fat Layer (Subcutaneous Tissue): Fat Layer (Subcutaneous Tissue): Yes Yes No Fascia: No Fascia: No Tendon: No Tendon: No Tendon: No Muscle: No Muscle: No Muscle: No Joint: No Joint: No Joint: No Bone: No Bone: No Bone: No Epithelialization: None N/A Medium (34-66%) Treatment Notes Electronic Signature(s) Signed: 01/31/2021 12:09:52 PM By: [name] Pax RN Entered By: [name] on 01/29/2021 12:46:13 [name]  ([id number]) -------------------------------------------------------------------------------- Multi-Disciplinary Care Plan Details Patient Name: [name] Hilts A. Date of Service: 01/29/2021 11:15 AM Medical Record Number: [id number] Patient Account Number: [id number] Date of Birth/Sex: [birth year] (87 y.o. M) Treating RN: [name] Primary Care Ijeoma Loor: [name] Other Clinician: Lolita [name] Referring Venesha Petraitis: [name] Treating Khalfani Weideman/Extender: [name] in Treatment: 14 Active Inactive Wound/Skin Impairment Nursing Diagnoses: Impaired tissue integrity Goals: Patient/caregiver will verbalize understanding of skin care regimen Date Initiated: 10/17/2020 Target Resolution Date: 02/26/2021 Goal Status: Active Ulcer/skin breakdown will have a volume reduction of 30% by week 4 Date Initiated: 10/17/2020 Date Inactivated: 01/01/2021 Target Resolution Date: 12/18/2019 Goal Status: Unmet Unmet Reason: comorbities Ulcer/skin breakdown will have a volume reduction of 50% by week 8 Date Initiated: 10/17/2020 Date Inactivated: 01/29/2021 Target Resolution Date: 01/18/2020 Goal Status: Unmet Unmet Reason: comorbities Ulcer/skin breakdown will have a volume reduction of 80% by week 12 Date Initiated: 10/17/2020 Target Resolution Date: 02/15/2020 Goal Status: Active Ulcer/skin breakdown will heal within 14 weeks Date Initiated: 10/17/2020 Target Resolution Date: 02/28/2021 Goal Status: Active Interventions: Assess patient/caregiver ability to obtain necessary supplies Assess ulceration(s) every visit Provide education on ulcer and skin care Treatment Activities: Skin care regimen initiated : 10/17/2020 Notes: Electronic Signature(s) Signed: 01/31/2021 12:09:52 PM By: [name] Pax RN Entered By: [name] on 01/29/2021 12:45:58 [name], [name] ([id number]) -------------------------------------------------------------------------------- Pain Assessment  Details Patient Name: [name] Hilts A. Date of Service: 01/29/2021 11:15 AM Medical Record Number: [id number] Patient Account Number: [id number] Date of Birth/Sex: [birth year] (87 y.o. M) Treating RN: [name] Primary Care Shaniyah Wix: [name] Other Clinician: Lolita [name] Referring Dayan Desa: [name] Treating Deanna Boehlke/Extender: [name] in Treatment: 14 Active Problems Location of Pain Severity and Description of Pain Patient Has Paino Yes Site Locations Rate the pain. Current Pain Level: 4 Pain Management and Medication Current Pain Management: Electronic Signature(s) Signed: 01/29/2021 1:04:35 PM By: [name] Signed: 01/31/2021 12:09:52 PM By: [name] Pax RN Entered By: [name] on 01/29/2021 11:58:26 [name] Hilts A. ([id number]) -------------------------------------------------------------------------------- Patient/Caregiver Education Details Patient Name: [name] Hilts A. Date of Service: 01/29/2021 11:15 AM Medical Record Number: [id number] Patient Account Number: [id number] Date of Birth/Gender: 1931-10-14 (87 y.o. M) Treating RN: [name] Primary Care Physician: [name] Other Clinician: Lolita [name] Referring Physician: Marcelino [name] Treating Physician/Extender: [name] in Treatment: 14 Education Assessment Education Provided To: Patient Education Topics Provided Wound/Skin Impairment: Methods: Explain/Verbal Responses: State content correctly [employer]) Signed: 01/31/2021 12:09:52 PM By: [name] Pax RN Entered By: [name] on  01/29/2021 12:49:09 Halsted, Kurtiss A. ([id number]) -------------------------------------------------------------------------------- Wound Assessment Details Patient Name: [name], [name] Date of Service: 01/29/2021 11:15 AM Medical Record Number: [id number] Patient Account Number: [id number] Date of Birth/Sex: 1931/08/04 (87 y.o. M) Treating RN: [name] Primary Care Airika Alkhatib: [name] Other Clinician: Lolita [name] Referring Sophiamarie Nease: [name] Treating Kairav Russomanno/Extender: Allen Derry Weeks in Treatment: 14 Wound Status Wound Number: 11 Primary Venous Leg Ulcer Etiology: Wound Location: Right, Lateral Lower Leg Wound Healed - Epithelialized Wounding Event: Gradually Appeared Status: Date Acquired: 01/08/2021 Comorbid Cataracts, Sleep Apnea, Congestive Heart Failure, Weeks Of Treatment: 2 History: Coronary Artery Disease, Hypertension, Osteoarthritis Clustered Wound: No Photos Wound Measurements Length: (cm) 0 Width: (cm) 0 Depth: (cm) 0 Area: (cm) 0 Volume: (cm) 0 % Reduction in Area: 100% % Reduction in Volume: 100% Epithelialization: None Tunneling: No Undermining: No Wound Description Classification: Full Thickness Without Exposed Support Structure Exudate Amount: None Present s Foul Odor After Cleansing: No Slough/Fibrino No Wound Bed Granulation Amount: None Present (0%) Exposed Structure Necrotic Amount: None Present (0%) Fascia Exposed: No Fat Layer (Subcutaneous Tissue) Exposed: No Tendon Exposed: No Muscle Exposed: No Joint Exposed: No Bone Exposed: No Treatment Notes Wound #11 (Lower Leg) Wound Laterality: Right, Lateral Cleanser Peri-Wound Care Topical Primary Dressing JAQUAVIOUS, MERCER A. ([id number]) Secondary Dressing Secured With Compression Wrap Compression Stockings Add-Ons Electronic Signature(s) Signed: 01/31/2021 12:09:52 PM By: [name] Pax RN Entered By: [name] on 01/29/2021 12:19:09 [name] Hilts A. ([id number]) -------------------------------------------------------------------------------- Wound Assessment Details Patient Name: [name] Hilts A. Date of Service: 01/29/2021 11:15 AM Medical Record Number: [id number] Patient Account Number: [id number] Date of Birth/Sex: [birth year] (87 y.o. M) Treating RN: [name] Primary Care Laya Letendre: [name] Other Clinician: Lolita [name] Referring Kolby Schara: [name] Treating Jacklyne Baik/Extender: Allen Derry Weeks in Treatment: 14 Wound Status Wound Number: 12 Primary Pressure Ulcer Etiology: Wound Location: Sacrum Wound Open Wounding Event: Pressure Injury Status: Date Acquired: 01/18/2021 Comorbid Cataracts, Sleep Apnea, Congestive Heart Failure, Weeks Of Treatment: 0 History: Coronary Artery Disease, Hypertension, Osteoarthritis Clustered Wound: No Photos Wound Measurements Length: (cm) 0.6 Width: (cm) 0.7 Depth: (cm) 0.3 Area: (cm) 0.33 Volume: (cm) 0.099 % Reduction in Area: % Reduction in Volume: Tunneling: No Undermining: No Wound Description Classification: Category/Stage II Wound Margin: Flat and Intact Exudate Amount: Medium Exudate Type: Serous Exudate Color: amber Foul Odor After Cleansing: No Slough/Fibrino Yes Wound Bed Granulation Amount: Small (1-33%) Exposed Structure Granulation Quality: Red Fascia Exposed: No Necrotic Amount: Small (1-33%) Fat Layer (Subcutaneous Tissue) Exposed: Yes Necrotic Quality: Adherent Slough Tendon Exposed: No Muscle Exposed: No Joint Exposed: No Bone Exposed: No Treatment Notes Wound #12 (Sacrum) Cleanser Normal Saline Discharge Instruction: Wash your hands with soap and water. Remove old dressing, discard into plastic bag and place into trash. Cleanse the wound with Normal Saline prior to applying a clean dressing using gauze sponges, not tissues or cotton balls. Do not scrub or use excessive force. Pat dry using gauze sponges, not tissue or cotton balls. [name], [name] ([id number]) Peri-Wound Care Topical Primary Dressing Prisma 4.34 (in) Discharge Instruction: Moisten w/normal saline or sterile water; Cover wound as directed. Do not remove from wound bed. Secondary Dressing Mepilex Border Flex, 4x4 (in/in) Discharge Instruction: Apply to wound as directed. Do not cut. Secured With Compression  Wrap Compression Stockings Add-Ons Electronic Signature(s) Signed: 01/29/2021 1:04:35 PM By: [name] Signed: 01/31/2021 12:09:52 PM By: [name] Pax RN Entered By: [name] on 01/29/2021 12:12:21 [name] Hilts A. ([id number]) -------------------------------------------------------------------------------- Wound Assessment Details Patient Name: [name], [name]  A. Date of Service: 01/29/2021 11:15 AM Medical Record Number: [id number] Patient Account Number: [id number] Date of Birth/Sex: 21-Aug-1931 (87 y.o. M) Treating RN: [name] PaxEpps, Carrie Primary Care Soumya Colson: [name] DusterJohnston, John Other Clinician: Lolita CramBurnette, Kyara Referring Killian Schwer: [name] DusterJohnston, John Treating Clovia Reine/Extender: Allen DerryStone, Hoyt Weeks in Treatment: 14 Wound Status Wound Number: 8 Primary Venous Leg Ulcer Etiology: Wound Location: Left, Medial Lower Leg Wound Open Wounding Event: Gradually Appeared Status: Date Acquired: 09/25/2020 Comorbid Cataracts, Sleep Apnea, Congestive Heart Failure, Weeks Of Treatment: 14 History: Coronary Artery Disease, Hypertension, Osteoarthritis Clustered Wound: No Photos Wound Measurements Length: (cm) 0.3 Width: (cm) 0.3 Depth: (cm) 0.2 Area: (cm) 0.071 Volume: (cm) 0.014 % Reduction in Area: 90% % Reduction in Volume: 80.3% Epithelialization: Medium (34-66%) Tunneling: No Undermining: No Wound Description Classification: Full Thickness Without Exposed Support Structures Wound Margin: Flat and Intact Exudate Amount: Small Exudate Type: Serous Exudate Color: amber Foul Odor After Cleansing: No Slough/Fibrino No Wound Bed Granulation Amount: Medium (34-66%) Exposed Structure Granulation Quality: Pink Fascia Exposed: No Necrotic Amount: None Present (0%) Fat Layer (Subcutaneous Tissue) Exposed: Yes Tendon Exposed: No Muscle Exposed: No Joint Exposed: No Bone Exposed: No Treatment Notes Wound #8 (Lower Leg) Wound Laterality: Left, Medial Cleanser Normal Saline Discharge  Instruction: Wash your hands with soap and water. Remove old dressing, discard into plastic bag and place into trash. Cleanse the wound with Normal Saline prior to applying a clean dressing using gauze sponges, not tissues or cotton balls. Do not scrub or use excessive force. Pat dry using gauze sponges, not tissue or cotton balls. [name], [name] ([id number]) Peri-Wound Care Topical Primary Dressing Silvercel Small 2x2 (in/in) Discharge Instruction: Apply Silvercel Small 2x2 (in/in) as instructed Secondary Dressing Gauze Discharge Instruction: As directed Secured With Kerlix Roll Sterile or Non-Sterile 6-ply 4.5x4 (yd/yd) Discharge Instruction: Apply Kerlix as directed Tubigrip Size F, 4x10 (in/yd) Discharge Instruction: Apply Tubigrip F 3 finger-widths below knee to base of toes to secure dressing and/or for swelling. Compression Wrap Compression Stockings Add-Ons Electronic Signature(s) Signed: 01/29/2021 1:04:35 PM By: [name] CramBurnette, Kyara Signed: 01/31/2021 12:09:52 PM By: [name] PaxEpps, Carrie RN Entered By: [name] CramBurnette, Kyara on 01/29/2021 12:14:04 [name], Ulysses A. ([id number]) -------------------------------------------------------------------------------- Vitals Details Patient Name: [name], Nichalas A. Date of Service: 01/29/2021 11:15 AM Medical Record Number: [id number] Patient Account Number: [id number] Date of Birth/Sex: 21-Aug-1931 (87 y.o. M) Treating RN: [name] PaxEpps, Carrie Primary Care Monick Rena: [name] DusterJohnston, John Other Clinician: Lolita CramBurnette, Kyara Referring Amillia Biffle: [name] DusterJohnston, John Treating Zannah Melucci/Extender: Allen DerryStone, Hoyt Weeks in Treatment: 14 Vital Signs Time Taken: 11:55 Temperature (F): 98.1 Pulse (bpm): 87 Respiratory Rate (breaths/min): 16 Blood Pressure (mmHg): 149/85 Reference Range: 80 - 120 mg / dl Electronic Signature(s) Signed: 01/29/2021 1:04:35 PM By: [name] CramBurnette, Kyara Entered By: [name] CramBurnette, Kyara on 01/29/2021 11:58:14

## 2021-01-31 NOTE — Progress Notes (Signed)
CAMDIN, HEGNER ([id number]) Visit Report for 01/29/2021 Chief Complaint Document Details Patient Name: [name], [name] Date of Service: 01/29/2021 11:15 AM Medical Record Number: [id number] Patient Account Number: [id number] Date of Birth/Sex: 1931-05-07 (87 y.o. M) Treating RN: Yevonne Pax Primary Care Provider: [name] Other Clinician: Lolita Cram Referring Provider: [name] Treating Provider/Extender: Rowan Blase in Treatment: 14 Information Obtained from: Patient Chief Complaint Left LE Ulcer and Sacral Ulcer Electronic Signature(s) Signed: 01/29/2021 12:23:23 PM By: Lenda Kelp PA-C Previous Signature: 01/29/2021 12:23:02 PM Version By: Lenda Kelp PA-C Entered By: Lenda Kelp on 01/29/2021 12:23:23 [name] Hilts A. ([id number]) -------------------------------------------------------------------------------- HPI Details Patient Name: [name] Hilts A. Date of Service: 01/29/2021 11:15 AM Medical Record Number: [id number] Patient Account Number: [id number] Date of Birth/Sex: June 22, 1931 (87 y.o. M) Treating RN: Yevonne Pax Primary Care Provider: [name] Other Clinician: Lolita Cram Referring Provider: [name] Treating Provider/Extender: Rowan Blase in Treatment: 14 History of Present Illness HPI Description: 04/21/2019 ADMISSION This is an independent 87 year old man who lives in the independent part of [address] of [place]. He has a history of chronic lower extremity edema and wears compression stockings. He states about a month ago he was taking off the one on the right and pulled some skin off accidentally. He has had 2 small wounds on the right anterior and right medial lower extremity. I note looking through [facility] health link that he had multiple venous ultrasounds in 2015 and 16. These were DVT rule outs. He did have a Baker's cyst on the left. He has not not had a previous wound history although he did have a  history of cellulitis in his legs. Past medical history; includes aortic stenosis status post mechanical AVR in 2007 on chronic Coumadin, lower extremity edema with a history of cellulitis, history of MRSA. ABIs in our clinic were 1.1 on the right 6/3; patient with predominantly venous insufficiency ulcers in the right lower leg probably some degree of lymphedema. He had to wounds last week. We put him in 3 layer compression. The nurses at [facility] are changing the dressing. The area laterally is healed but he still has a small very painful area on the anterior tibial area. He is on Coumadin because of mechanical aortic valve. 6/10; venous insufficiency ulcers on the right lower leg. He has some degree of lymphedema. We have been using 3 layer compression with silver collagen small wound. Dimensions are improved. I gave him doxycycline last week which he tolerated because of surrounding erythema. This is improved also 6/17; arrives in clinic today with portable silver collagen dressing tightly adherent to the wound. Also felt that the wrap was too tight 3 layer being changed at the [facility] 6/24; patient's wound is small still adherent debris over the surface however even under illumination it was hard to see what was still open here. We have been using silver collagen 7/1; the patient arrives in the clinic today and the area on the right lower leg is healed. He has severe chronic venous insufficiency. He has 20/30 compression stockings. Readmission: 06/22/2020 on evaluation today patient presents for reevaluation here in our clinic although it has been a little over a year since we last saw him. He does have a history of lymphedema, venous insufficiency, anticoagulant therapy, he is on a pacemaker which is the reason for the anticoagulants, hypertension, and congestive heart failure. With that being said unfortunately he has been dealing with a wound of the left  and right  legs which has been giving him some trouble since arrival first 2021. That is in regard to the left leg ulcer. In regard to the right leg ulcer this is just a very small area that really I think will seal up quite nicely is more of the lymphedema opening than anything else. With that being said unfortunately the left leg is quite significant. We actually have noted that the patient's been dealing with this for quite some time and I think that there is a chance we may want to consider biopsy. With that being said he is on Coumadin therefore we were not able to perform a biopsy at this point. I think that something however in the future that may be warranted we may just have to take him off of the current Coumadin regimen in order to do this. We discussed doing it today but I am more concerned about the fact that he could have issues with uncontrolled bleeding and in that case we may have to send him to the ER. His daughter was not wanting to take that chance which I can completely understand. Nonetheless so far they have been using antibiotic ointment over the area I am just putting a protective dressing I do believe the patient is that he needs some type of compression. 07/07/2020 on evaluation today patient appears to be doing poorly in regard to his bilateral lower extremities today. He actually went to urgent care last night due to having pain in his legs due to the wraps. It appears they got somewhat tight on him because of the fact that he is having such significant swelling. He appears to be fluid overloaded I do not see any signs of infection actively at this point which is good news but unfortunately I think that this is something that may need to be addressed even by cardiology more so than just with a different or alternate type of wrap. I will contact his cardiologist, Dr. Mariah Milling, as well. 07/10/2020 upon evaluation today patient unfortunately appears to be doing worse even than when I saw him  on Friday from the standpoint of where his legs stand. The main issue is that he has increased erythema although there is still not warm to touch I am concerned about infection and about this getting significantly worse. Again my initial concern was more fluid overload and I discussed this with his cardiology office on Friday. With that being said I feel like this has gotten worse his daughter is in agreement and I think that the best option would be to send him to the hospital at this point. READMISSION 09/06/2020 Is a patient who has had 2 stays in this clinic firstly in 2020 and more recently from 06/02/2020 through 07/10/2020. He was cared for by Allen Derry. When he was last here he had bilateral lower extremity ulcers in the setting of chronic venous insufficiency with lymphedema. When he we last saw him he had weeping edema fluido Cellulitis and he was sent to the hospital. He was admitted initially from 06/30/2020 through 07/14/2020 with cellulitis plus stasis dermatitis. Treated empirically with vancomycin and had increases in his torsemide. He was then sent to the skilled level of [facility] but readmitted to hospital from 07/29/2020 through 08/10/2020 with blood loss anemia hemoglobin of 4 and OB positive stools. During this hospitalization he was noted to have a sacral wound at stage II. He was sent to [facility] again in the skilled level. He  had compression on his legs with Coban. He is now back at his independent living setting. He has not been wearing compression and his daughter feels there is already increasing swelling. To have stockings at home not exactly sure at what strength. [name], [name] ([id number]) Past medical history includes lymphedema, chronic venous insufficiency, pacemaker, congestive heart failure, hypertension, aortic valve stenosis status post mechanical valve replacement in 2002 on chronic Coumadin, chronic kidney disease stage IIIb, atrial  fibrillation, peripheral neuropathy, large hiatal hernia. We did not test his ABIs in the clinic today. Readmission: 10/17/2020 on evaluation today patient appears to be doing somewhat poorly in regard to his left leg and the fact that he does have an open wound at this point. He has not had the compression socks placed at the facility they have not been putting them on at all unfortunately. He does not have on the right leg currently and apparently there is significant put him on the left leg with the wound which I understand is that can be somewhat tight obviously. Nonetheless I do believe the right leg needs to have a compression sock and I do believe on the left side right now we can do something a little better in order to try to get the wound healed at this point. The patient is on torsemide and currently allopurinol for gout. He also has daily weights due to congestive heart failure I am assuming in order to monitor his fluid status overall. He is currently a DNR at the facility. 10/30/2020 upon evaluation today patient appears to be doing somewhat poorly at this time in regard to his bilateral lower extremities. His left leg which is the only place where he had a wound last week when I saw him is actually doing worse with new wound openings. The right leg also has a small wound as well. However the most concerning thing is that last week he did not have any signs of infection or erythema there is no warmth to touch over the leg. This week the entire leg is more sore he also has erythema extending from the ankle to just below the knee with the area being erythematous and warm to touch which I believe represents cellulitis at minimum. I am concerned about how this is spread so quickly and the fact that he is having increased pain and also not feeling as well. All in all I discussed with the patient that unfortunately I feel like he may be best served going to the ER for further evaluation of  this issue currently to try to see about the potential for IV antibiotics and to have appropriate lab work performed. His daughter is present during the visit today and she was included in the decision making at this point. 11/13/2020 upon evaluation today patient appears to be doing well with regard to his leg ulcer. Fortunately he does not appear to be showing signs of infection anything like it was previous. This is great news. He was sent home with IV antibiotic therapy that seems to have done extremely well for him. No fevers, chills, nausea, vomiting, or diarrhea. 11/27/2020 upon evaluation today patient appears to be doing quite well in regard to his wounds currently. Fortunately there is no signs of active infection at this time which is great news and I am very pleased in this regard. He did have some concerns or his daughter did about an area on his gluteal region. With that being said I see nothing that is  actually open at this point which is great news I do think continuing with a derma cloud is a good way to go. With regard to his leg he does have a new area on the right leg of open wound locations which is new compared to last visit we will need to address this as well. Otherwise he seems to be doing quite well in my opinion. 12/15/2020 upon evaluation today patient appears to be doing well with regard to his leg ulcer on the left. With that being said he did not have the Tubigrip on which I think is integral to getting this closed as he continues to weep. Subsequently I think the Tubigrip will help control some of the edema which will help him in that regard. Fortunately there is no signs of active infection at this time. No fevers, chills, nausea, vomiting, or diarrhea. 01/01/2021 upon evaluation today patient appears to be doing well with regard to his wound. This is measuring better and actually is significantly smaller. Fortunately there is no signs of active infection at this time. No  fevers, chills, nausea, vomiting, or diarrhea. 01/15/2021 on evaluation today patient appears to be doing well with regard to his left leg ulcer though he still continues to weep quite a bit he did not have the Tubigrip on the day that he supposed to. Fortunately there is no signs of active infection at this time. No fevers, chills, nausea, vomiting, or diarrhea. 01/29/2021 upon evaluation today patient appears to be doing well with regard to his right leg which is completely healed. His left leg though not healed is just a very small open area that is weeping. With regard to his gluteal region this is actually new and in fact he has a wound that is really in the sacral area. This is stated to be a stage II that may be the case there is really no slough buildup I [name] leave it as such for the time being. We will keep a close eye on things however and see how it proceeds. Electronic Signature(s) Signed: 01/29/2021 12:23:31 PM By: Lenda Kelp PA-C Entered By: Lenda Kelp on 01/29/2021 12:23:30 [name] ([id number]) -------------------------------------------------------------------------------- Physical Exam Details Patient Name: [name] Hilts A. Date of Service: 01/29/2021 11:15 AM Medical Record Number: [id number] Patient Account Number: [id number] Date of Birth/Sex: 06-Aug-1931 (87 y.o. M) Treating RN: Yevonne Pax Primary Care Provider: [name] Other Clinician: Lolita Cram Referring Provider: [name] Treating Provider/Extender: Allen Derry Weeks in Treatment: 14 Constitutional Well-nourished and well-hydrated in no acute distress. Respiratory normal breathing without difficulty. Psychiatric this patient is able to make decisions and demonstrates good insight into disease process. Alert and Oriented x 3. pleasant and cooperative. Notes Upon inspection patient's wound bed actually showed signs of good granulation at this point. In regard to the lower  extremity that is. In regard to the sacral region this appears to be very superficial though is tender. I am hopeful that we can get this to heal fairly quickly. We will use collagen in this area. Electronic Signature(s) Signed: 01/29/2021 12:23:56 PM By: Lenda Kelp PA-C Entered By: Lenda Kelp on 01/29/2021 12:23:54 [name] ([id number]) -------------------------------------------------------------------------------- Physician Orders Details Patient Name: [name] Hilts A. Date of Service: 01/29/2021 11:15 AM Medical Record Number: [id number] Patient Account Number: [id number] Date of Birth/Sex: [birth year] (87 y.o. M) Treating RN: Yevonne Pax Primary Care Provider: [name] Other Clinician: Lolita Cram Referring Provider: [name] Treating Provider/Extender: Allen Derry Weeks in  Treatment: 14 Verbal / Phone Orders: No Diagnosis Coding Follow-up Appointments o Return Appointment in 2 weeks. Bathing/ Shower/ Hygiene Wound #8 Left,Medial Lower Leg o Clean wound with Normal Saline or wound cleanser. Edema Control - Lymphedema / Segmental Compressive Device / Other Bilateral Lower Extremities o Tubigrip single layer applied. - left lower leg o Patient to wear own compression stockings. Remove compression stockings every night before going to bed and put on every morning when getting up. - right lower leg o Elevate legs to the level of the heart and pump ankles as often as possible o Elevate leg(s) parallel to the floor when sitting. Off-Loading o Turn and reposition every 2 hours Wound Treatment Wound #12 - Sacrum Cleanser: Normal Saline (Generic) 3 x Per Week/30 Days Discharge Instructions: Wash your hands with soap and water. Remove old dressing, discard into plastic bag and place into trash. Cleanse the wound with Normal Saline prior to applying a clean dressing using gauze sponges, not tissues or cotton balls. Do not scrub or use  excessive force. Pat dry using gauze sponges, not tissue or cotton balls. Primary Dressing: Prisma 4.34 (in) (Generic) 3 x Per Week/30 Days Discharge Instructions: Moisten w/normal saline or sterile water; Cover wound as directed. Do not remove from wound bed. Secondary Dressing: Mepilex Border Flex, 4x4 (in/in) (Generic) 3 x Per Week/30 Days Discharge Instructions: Apply to wound as directed. Do not cut. Wound #8 - Lower Leg Wound Laterality: Left, Medial Cleanser: Normal Saline (Generic) 3 x Per Week/30 Days Discharge Instructions: Wash your hands with soap and water. Remove old dressing, discard into plastic bag and place into trash. Cleanse the wound with Normal Saline prior to applying a clean dressing using gauze sponges, not tissues or cotton balls. Do not scrub or use excessive force. Pat dry using gauze sponges, not tissue or cotton balls. Primary Dressing: Silvercel Small 2x2 (in/in) (Generic) 3 x Per Week/30 Days Discharge Instructions: Apply Silvercel Small 2x2 (in/in) as instructed Secondary Dressing: Gauze 3 x Per Week/30 Days Discharge Instructions: As directed Secured With: Kerlix Roll Sterile or Non-Sterile 6-ply 4.5x4 (yd/yd) (Generic) 3 x Per Week/30 Days Discharge Instructions: Apply Kerlix as directed Secured With: Tubigrip Size F, 4x10 (in/yd) (Generic) 3 x Per Week/30 Days Discharge Instructions: Apply Tubigrip F 3 finger-widths below knee to base of toes to secure dressing and/or for swelling. Electronic Signature(s) Signed: 01/31/2021 8:14:32 AM By: Lenda Kelp PA-C Signed: 01/31/2021 12:09:52 PM By: Yevonne Pax RN Previous Signature: 01/29/2021 12:29:11 PM Version By: Geni Bers, Zayven A. ([id number]) Entered By: Yevonne Pax on 01/29/2021 12:46:59 [name] Hilts A. ([id number]) -------------------------------------------------------------------------------- Problem List Details Patient Name: [name] Hilts A. Date of Service: 01/29/2021 11:15  AM Medical Record Number: [id number] Patient Account Number: [id number] Date of Birth/Sex: 1931-09-09 (87 y.o. M) Treating RN: Yevonne Pax Primary Care Provider: [name] Other Clinician: Lolita Cram Referring Provider: [name] Treating Provider/Extender: Rowan Blase in Treatment: 14 Active Problems ICD-10 Encounter Code Description Active Date MDM Diagnosis I89.0 Lymphedema, not elsewhere classified 10/17/2020 No Yes I87.2 Venous insufficiency (chronic) (peripheral) 10/17/2020 No Yes L97.822 Non-pressure chronic ulcer of other part of left lower leg with fat layer 10/17/2020 No Yes exposed L97.812 Non-pressure chronic ulcer of other part of right lower leg with fat layer 10/17/2020 No Yes exposed L89.152 Pressure ulcer of sacral region, stage 2 01/29/2021 No Yes Z79.01 Long term (current) use of anticoagulants 10/17/2020 No Yes Z95.0 Presence of cardiac pacemaker 10/17/2020 No Yes I10 Essential (primary) hypertension  10/17/2020 No Yes 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 ([id number]) Previous Signature: 01/29/2021 12:22:55 PM Version By: Lenda Kelp PA-C Entered By: Lenda Kelp on 01/29/2021 12:25:43 [name] Hilts A. ([id number]) -------------------------------------------------------------------------------- Progress Note Details Patient Name: [name] Hilts A. Date of Service: 01/29/2021 11:15 AM Medical Record Number: [id number] Patient Account Number: [id number] Date of Birth/Sex: 02-13-1931 (87 y.o. M) Treating RN: Yevonne Pax Primary Care Provider: [name] Other Clinician: Lolita Cram Referring Provider: [name] Treating  Provider/Extender: Rowan Blase in Treatment: 14 Subjective Chief Complaint Information obtained from Patient Left LE Ulcer and Sacral Ulcer History of Present Illness (HPI) 04/21/2019 ADMISSION This is an independent 87 year old man who lives in the independent part of [address] of [place]. He has a history of chronic lower extremity edema and wears compression stockings. He states about a month ago he was taking off the one on the right and pulled some skin off accidentally. He has had 2 small wounds on the right anterior and right medial lower extremity. I note looking through [facility] health link that he had multiple venous ultrasounds in 2015 and 16. These were DVT rule outs. He did have a Baker's cyst on the left. He has not not had a previous wound history although he did have a history of cellulitis in his legs. Past medical history; includes aortic stenosis status post mechanical AVR in 2007 on chronic Coumadin, lower extremity edema with a history of cellulitis, history of MRSA. ABIs in our clinic were 1.1 on the right 6/3; patient with predominantly venous insufficiency ulcers in the right lower leg probably some degree of lymphedema. He had to wounds last week. We put him in 3 layer compression. The nurses at [facility] are changing the dressing. The area laterally is healed but he still has a small very painful area on the anterior tibial area. He is on Coumadin because of mechanical aortic valve. 6/10; venous insufficiency ulcers on the right lower leg. He has some degree of lymphedema. We have been using 3 layer compression with silver collagen small wound. Dimensions are improved. I gave him doxycycline last week which he tolerated because of surrounding erythema. This is improved also 6/17; arrives in clinic today with portable silver collagen dressing tightly adherent to the wound. Also felt that the wrap was too tight 3 layer being changed at the [facility] 6/24; patient's wound is small still adherent debris over the surface however even under illumination it was hard to see what was still open here. We have been using silver collagen 7/1; the patient arrives in the clinic today and the area on the right lower leg is healed. He has severe chronic venous insufficiency. He has 20/30 compression stockings. Readmission: 06/22/2020 on evaluation today patient presents for reevaluation here in our clinic although it has been a little over a year since we last saw him. He does have a history of lymphedema, venous insufficiency, anticoagulant therapy, he is on a pacemaker which is the reason for the anticoagulants, hypertension, and congestive heart failure. With that being said unfortunately he has been dealing with a wound of the left and right legs which has been giving him some trouble since arrival first 2021. That is in regard to the left leg ulcer.  In regard to the right leg ulcer this is just a very small area that really I think will seal up quite nicely is more of the lymphedema opening than anything else. With that being said unfortunately the left leg is quite significant. We actually have noted that the patient's been dealing with this for quite some time and I think that there is a chance we may want to consider biopsy. With that being said he is on Coumadin therefore we were not able to perform a biopsy at this point. I think that something however in the future that may be warranted we may just have to take him off of the current Coumadin regimen in order to do this. We discussed doing it today but I am more concerned about the fact that he could have issues with uncontrolled bleeding and in that case we may have to send him to the ER. His daughter was not wanting to take that chance which I can completely understand. Nonetheless so far they have been using antibiotic ointment over the area I am just putting a protective dressing I do  believe the patient is that he needs some type of compression. 07/07/2020 on evaluation today patient appears to be doing poorly in regard to his bilateral lower extremities today. He actually went to urgent care last night due to having pain in his legs due to the wraps. It appears they got somewhat tight on him because of the fact that he is having such significant swelling. He appears to be fluid overloaded I do not see any signs of infection actively at this point which is good news but unfortunately I think that this is something that may need to be addressed even by cardiology more so than just with a different or alternate type of wrap. I will contact his cardiologist, Dr. Mariah Milling, as well. 07/10/2020 upon evaluation today patient unfortunately appears to be doing worse even than when I saw him on Friday from the standpoint of where his legs stand. The main issue is that he has increased erythema although there is still not warm to touch I am concerned about infection and about this getting significantly worse. Again my initial concern was more fluid overload and I discussed this with his cardiology office on Friday. With that being said I feel like this has gotten worse his daughter is in agreement and I think that the best option would be to send him to the hospital at this point. READMISSION 09/06/2020 Is a patient who has had 2 stays in this clinic firstly in 2020 and more recently from 06/02/2020 through 07/10/2020. He was cared for by [facility]. When he was last here he had bilateral lower extremity ulcers in the setting of chronic venous insufficiency with lymphedema. When he we last saw him he had weeping edema fluido Cellulitis and he was sent to the hospital. He was admitted initially from 06/30/2020 through 07/14/2020 [facility], Jacksen A. ([id number]) with cellulitis plus stasis dermatitis. Treated empirically with vancomycin and had increases in his torsemide. He was then sent to the skilled  level of [facility] but readmitted to hospital from 07/29/2020 through 08/10/2020 with blood loss anemia hemoglobin of 4 and OB positive stools. During this hospitalization he was noted to have a sacral wound at stage II. He was sent to [facility] again in the skilled level. He had compression on his legs with Coban. He is now back at his independent living setting. He has not  been wearing compression and his daughter feels there is already increasing swelling. To have stockings at home not exactly sure at what strength. Past medical history includes lymphedema, chronic venous insufficiency, pacemaker, congestive heart failure, hypertension, aortic valve stenosis status post mechanical valve replacement in 2002 on chronic Coumadin, chronic kidney disease stage IIIb, atrial fibrillation, peripheral neuropathy, large hiatal hernia. We did not test his ABIs in the clinic today. Readmission: 10/17/2020 on evaluation today patient appears to be doing somewhat poorly in regard to his left leg and the fact that he does have an open wound at this point. He has not had the compression socks placed at the facility they have not been putting them on at all unfortunately. He does not have on the right leg currently and apparently there is significant put him on the left leg with the wound which I understand is that can be somewhat tight obviously. Nonetheless I do believe the right leg needs to have a compression sock and I do believe on the left side right now we can do something a little better in order to try to get the wound healed at this point. The patient is on torsemide and currently allopurinol for gout. He also has daily weights due to congestive heart failure I am assuming in order to monitor his fluid status overall. He is currently a DNR at the facility. 10/30/2020 upon evaluation today patient appears to be doing somewhat poorly at this time in regard to his bilateral lower  extremities. His left leg which is the only place where he had a wound last week when I saw him is actually doing worse with new wound openings. The right leg also has a small wound as well. However the most concerning thing is that last week he did not have any signs of infection or erythema there is no warmth to touch over the leg. This week the entire leg is more sore he also has erythema extending from the ankle to just below the knee with the area being erythematous and warm to touch which I believe represents cellulitis at minimum. I am concerned about how this is spread so quickly and the fact that he is having increased pain and also not feeling as well. All in all I discussed with the patient that unfortunately I feel like he may be best served going to the ER for further evaluation of this issue currently to try to see about the potential for IV antibiotics and to have appropriate lab work performed. His daughter is present during the visit today and she was included in the decision making at this point. 11/13/2020 upon evaluation today patient appears to be doing well with regard to his leg ulcer. Fortunately he does not appear to be showing signs of infection anything like it was previous. This is great news. He was sent home with IV antibiotic therapy that seems to have done extremely well for him. No fevers, chills, nausea, vomiting, or diarrhea. 11/27/2020 upon evaluation today patient appears to be doing quite well in regard to his wounds currently. Fortunately there is no signs of active infection at this time which is great news and I am very pleased in this regard. He did have some concerns or his daughter did about an area on his gluteal region. With that being said I see nothing that is actually open at this point which is great news I do think continuing with a derma cloud is a good way to go. With  regard to his leg he does have a new area on the right leg of open wound locations  which is new compared to last visit we will need to address this as well. Otherwise he seems to be doing quite well in my opinion. 12/15/2020 upon evaluation today patient appears to be doing well with regard to his leg ulcer on the left. With that being said he did not have the Tubigrip on which I think is integral to getting this closed as he continues to weep. Subsequently I think the Tubigrip will help control some of the edema which will help him in that regard. Fortunately there is no signs of active infection at this time. No fevers, chills, nausea, vomiting, or diarrhea. 01/01/2021 upon evaluation today patient appears to be doing well with regard to his wound. This is measuring better and actually is significantly smaller. Fortunately there is no signs of active infection at this time. No fevers, chills, nausea, vomiting, or diarrhea. 01/15/2021 on evaluation today patient appears to be doing well with regard to his left leg ulcer though he still continues to weep quite a bit he did not have the Tubigrip on the day that he supposed to. Fortunately there is no signs of active infection at this time. No fevers, chills, nausea, vomiting, or diarrhea. 01/29/2021 upon evaluation today patient appears to be doing well with regard to his right leg which is completely healed. His left leg though not healed is just a very small open area that is weeping. With regard to his gluteal region this is actually new and in fact he has a wound that is really in the sacral area. This is stated to be a stage II that may be the case there is really no slough buildup I [name] leave it as such for the time being. We will keep a close eye on things however and see how it proceeds. Objective Constitutional Well-nourished and well-hydrated in no acute distress. Vitals Time Taken: 11:55 AM, Temperature: 98.1 F, Pulse: 87 bpm, Respiratory Rate: 16 breaths/min, Blood Pressure: 149/85 mmHg. Respiratory normal breathing  without difficulty. Psychiatric this patient is able to make decisions and demonstrates good insight into disease process. Alert and Oriented x 3. pleasant and cooperative. [name], [name] ([id number]) General Notes: Upon inspection patient's wound bed actually showed signs of good granulation at this point. In regard to the lower extremity that is. In regard to the sacral region this appears to be very superficial though is tender. I am hopeful that we can get this to heal fairly quickly. We will use collagen in this area. Integumentary (Hair, Skin) Wound #11 status is Healed - Epithelialized. Original cause of wound was Gradually Appeared. The date acquired was: 01/08/2021. The wound has been in treatment 2 weeks. The wound is located on the Right,Lateral Lower Leg. The wound measures 0cm length x 0cm width x 0cm depth; 0cm^2 area and 0cm^3 volume. There is no tunneling or undermining noted. There is a none present amount of drainage noted. There is no granulation within the wound bed. There is no necrotic tissue within the wound bed. Wound #12 status is Open. Original cause of wound was Pressure Injury. The date acquired was: 01/18/2021. The wound is located on the Sacrum. The wound measures 0.6cm length x 0.7cm width x 0.3cm depth; 0.33cm^2 area and 0.099cm^3 volume. There is Fat Layer (Subcutaneous Tissue) exposed. There is no tunneling or undermining noted. There is a medium amount of serous drainage noted. The wound  margin is flat and intact. There is small (1-33%) red granulation within the wound bed. There is a small (1-33%) amount of necrotic tissue within the wound bed including Adherent Slough. Wound #8 status is Open. Original cause of wound was Gradually Appeared. The date acquired was: 09/25/2020. The wound has been in treatment 14 weeks. The wound is located on the Left,Medial Lower Leg. The wound measures 0.3cm length x 0.3cm width x 0.2cm depth; 0.071cm^2 area and 0.014cm^3 volume.  There is Fat Layer (Subcutaneous Tissue) exposed. There is no tunneling or undermining noted. There is a small amount of serous drainage noted. The wound margin is flat and intact. There is medium (34-66%) pink granulation within the wound bed. There is no necrotic tissue within the wound bed. 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 Bathing/ Shower/ Hygiene: Wound #8 Left,Medial Lower Leg: Clean wound with Normal Saline or wound cleanser. Edema Control - Lymphedema / Segmental Compressive Device / Other: Tubigrip single layer applied. - left lower leg Patient to wear own compression stockings. Remove compression stockings every night before going to bed and put on every morning when getting up. - right lower leg Elevate legs to the level of the heart and pump ankles as often as possible Elevate leg(s) parallel to the floor when sitting. Off-Loading: Turn and reposition every 2 hours WOUND #12: - Sacrum Wound Laterality: Cleanser: Normal Saline (Generic) 3 x Per Week/30 Days Discharge Instructions: Wash your hands with soap and water. Remove old dressing, discard into plastic bag and place into trash. Cleanse the wound with Normal Saline prior to applying a clean dressing using gauze sponges, not tissues or cotton balls. Do not scrub or use excessive force. Pat dry using gauze sponges,  not tissue or cotton balls. Primary Dressing: Prisma 4.34 (in) (Generic) 3 x Per Week/30 Days Discharge Instructions: Moisten w/normal saline or sterile water; Cover wound as directed. Do not remove from wound bed. Secondary Dressing: Mepilex Border Flex, 4x4 (in/in) (Generic) 3 x Per Week/30 Days Discharge Instructions: Apply to wound as directed. Do not cut. WOUND #8: - Lower Leg Wound Laterality: Left, Medial Cleanser: Normal Saline (Generic) 3 x Per Week/30 Days Discharge Instructions: Wash your hands with soap and water. Remove old dressing, discard into plastic bag and place into trash. Cleanse the wound with Normal Saline prior to applying a clean dressing using gauze sponges, not tissues or cotton balls. Do not scrub or use excessive force. Pat dry using gauze sponges, not tissue or cotton balls. [name], [name] ([id number]) Primary Dressing: Silvercel Small 2x2 (in/in) (Generic) 3 x Per Week/30 Days Discharge Instructions: Apply Silvercel Small 2x2 (in/in) as instructed Secondary Dressing: Gauze 3 x Per Week/30 Days Discharge Instructions: As directed Secured With: Kerlix Roll Sterile or Non-Sterile 6-ply 4.5x4 (yd/yd) (Generic) 3 x Per Week/30 Days Discharge Instructions: Apply Kerlix as directed Secured With: Tubigrip Size F, 4x10 (in/yd) (Generic) 3 x Per Week/30 Days Discharge Instructions: Apply Tubigrip F 3 finger-widths below knee to base of toes to secure dressing and/or for swelling.  1. We will continue with the silver alginate and Tubigrip for the leg I think that still the best way to go. 2. I am also can recommend that we have the patient go ahead and utilize silver collagen for the sacral region I think this will be a good option and a border foam dressing for protection. 3. I would recommend that offloading in regard to the bottom be performed to keep pressure off he should be repositioned every 2 hours to ensure is not sitting for long periods of time. We will see  patient back for reevaluation in 2 weeks here in the clinic. If anything worsens or changes patient will contact our office for additional recommendations. Electronic Signature(s) Signed: 01/29/2021 12:27:54 PM By: Lenda Kelp PA-C Previous Signature: 01/29/2021 12:24:35 PM Version By: Lenda Kelp PA-C Entered By: Lenda Kelp on 01/29/2021 12:27:54 [name] Hilts A. ([id number]) -------------------------------------------------------------------------------- SuperBill Details Patient Name: [name] Hilts A. Date of Service: 01/29/2021 Medical Record Number: [id number] Patient Account Number: [id number] Date of Birth/Sex: [birth year] (87 y.o. M) Treating RN: Yevonne Pax Primary Care Provider: [name] Other Clinician: Lolita Cram Referring Provider: [name] Treating Provider/Extender: Rowan Blase in Treatment: 14 Diagnosis Coding ICD-10 Codes Code Description I89.0 Lymphedema, not elsewhere classified I87.2 Venous insufficiency (chronic) (peripheral) L97.822 Non-pressure chronic ulcer of other part of left lower leg with fat layer exposed L97.812 Non-pressure chronic ulcer of other part of right lower leg with fat layer exposed L89.152 Pressure ulcer of sacral region, stage 2 Z79.01 Long term (current) use of anticoagulants Z95.0 Presence of cardiac pacemaker I10 Essential (primary) hypertension I50.42 Chronic combined systolic (congestive) and diastolic (congestive) heart failure I25.10 Atherosclerotic heart disease of native coronary artery without angina pectoris Facility Procedures CPT4 Code: [id number] Description: 99213 - WOUND CARE VISIT-LEV 3 EST PT Modifier: Quantity: 1 Physician Procedures CPT4 Code: [id number] Description: 99214 - WC PHYS LEVEL 4 - EST PT Modifier: Quantity: 1 CPT4 Code: Description: ICD-10 Diagnosis Description I89.0 Lymphedema, not elsewhere classified I87.2 Venous insufficiency (chronic) (peripheral) L97.822 Non-pressure  chronic ulcer of other part of left lower leg with fat lay L89.152 Pressure ulcer of sacral  region, stage 2 Modifier: er exposed Quantity: Electronic Signature(s) Signed: 01/31/2021 8:14:32 AM By: Lenda Kelp PA-C Signed: 01/31/2021 12:09:52 PM By: Yevonne Pax RN Previous Signature: 01/29/2021 12:28:24 PM Version By: Lenda Kelp PA-C Previous Signature: 01/29/2021 12:24:54 PM Version By: Lenda Kelp PA-C Entered By: Yevonne Pax on 01/29/2021 12:48:36

## 2021-02-12 ENCOUNTER — Encounter: Payer: Medicare Other | Admitting: Physician Assistant

## 2021-02-12 ENCOUNTER — Other Ambulatory Visit
Admission: RE | Admit: 2021-02-12 | Discharge: 2021-02-12 | Disposition: A | Discharge status: Home / Self Care | Payer: Medicare Other | Source: Ambulatory Visit | Attending: Internal Medicine | Admitting: Internal Medicine

## 2021-02-12 ENCOUNTER — Other Ambulatory Visit: Payer: Self-pay

## 2021-02-12 DIAGNOSIS — I4891 Unspecified atrial fibrillation: Secondary | ICD-10-CM | POA: Insufficient documentation

## 2021-02-12 DIAGNOSIS — L97822 Non-pressure chronic ulcer of other part of left lower leg with fat layer exposed: Secondary | ICD-10-CM | POA: Diagnosis not present

## 2021-02-12 LAB — PROTIME-INR
INR: 3.1 — ABNORMAL HIGH (ref 0.8–1.2)
Prothrombin Time: 30.9 seconds — ABNORMAL HIGH (ref 11.4–15.2)

## 2021-02-12 NOTE — Progress Notes (Addendum)
[name], [name] ([id number]) Visit Report for 02/12/2021 Chief Complaint Document Details Patient Name: [name], [name] Date of Service: 02/12/2021 12:30 PM Medical Record Number: [id number] Patient Account Number: [id number] Date of Birth/Sex: April 29, 1931 (87 y.o. M) Treating RN: Yevonne PaxEpps, Carrie Primary Care Provider: Marcelino DusterJohnston, John Other Clinician: Lolita CramBurnette, Kyara Referring Provider: Marcelino DusterJohnston, John Treating Provider/Extender: Rowan BlaseStone, Fenna Semel Weeks in Treatment: 16 Information Obtained from: Patient Chief Complaint Left LE Ulcer and Sacral Ulcer Electronic Signature(s) Signed: 02/12/2021 12:56:52 PM By: Lenda KelpStone III, Mariann Palo PA-C Entered By: Lenda KelpStone III, Zakir Henner on 02/12/2021 12:56:52 [name], [name] ([id number]) -------------------------------------------------------------------------------- HPI Details Patient Name: [name], [name] Date of Service: 02/12/2021 12:30 PM Medical Record Number: [id number] Patient Account Number: [id number] Date of Birth/Sex: April 29, 1931 (87 y.o. M) Treating RN: Yevonne PaxEpps, Carrie Primary Care Provider: Marcelino DusterJohnston, John Other Clinician: Lolita CramBurnette, Kyara Referring Provider: Marcelino DusterJohnston, John Treating Provider/Extender: Rowan BlaseStone, Korby Ratay Weeks in Treatment: 16 History of Present Illness HPI Description: 04/21/2019 ADMISSION This is an independent 87 year old man who lives in the independent part of [address]. He has a history of chronic lower extremity edema and wears compression stockings. He states about a month ago he was taking off the one on the right and pulled some skin off accidentally. He has had 2 small wounds on the right anterior and right medial lower extremity. [facility] that he had multiple venous ultrasounds in 2015 and 16. These were DVT rule outs. He did have a Baker's cyst on the left. He has not not had a previous wound history although he did have a history of cellulitis in his legs. Past medical history; includes aortic  stenosis status post mechanical AVR in 2007 on chronic Coumadin, lower extremity edema with a history of cellulitis, history of MRSA. ABIs in our clinic were 1.1 on the right 6/3; patient with predominantly venous insufficiency ulcers in the right lower leg probably some degree of lymphedema. He had to wounds last week. We put him in 3 layer compression. [facility] [place] [facility]. The area laterally is healed but he still has a small very painful area on the anterior tibial area. He is on Coumadin because of mechanical aortic valve. 6/10; venous insufficiency ulcers on the right lower leg. He has some degree of lymphedema. We have been using 3 layer compression with silver collagen small wound. Dimensions are improved. I gave him doxycycline last week which he tolerated because of surrounding erythema. This is improved also 6/17; arrives in clinic today with portable silver collagen dressing tightly adherent to the wound. Also felt that the wrap was too tight 3 [facility]; patient's wound is small still adherent debris over the surface however even under illumination it was hard to see what was still open here. We have been using silver collagen 7/1; the patient arrives in the clinic today and the area on the right lower leg is healed. He has severe chronic venous insufficiency. He has 20/30 compression stockings. Readmission: 06/22/2020 on evaluation today patient presents for reevaluation here in our clinic although it has been a little over a year since we last saw him. He does have a history of lymphedema, venous insufficiency, anticoagulant therapy, he is on a pacemaker which is the reason for the anticoagulants, hypertension, and congestive heart failure. With that being said unfortunately he has been dealing with a wound of the left and right legs which has been giving him some trouble since  arrival first 2021. That  is in regard to the left leg ulcer. In regard to the right leg ulcer this is just a very small area that really I think will seal up quite nicely is more of the lymphedema opening than anything else. With that being said unfortunately the left leg is quite significant. We actually have noted that the patient's been dealing with this for quite some time and I think that there is a chance we may want to consider biopsy. With that being said he is on Coumadin therefore we were not able to perform a biopsy at this point. I think that something however in the future that may be warranted we may just have to take him off of the current Coumadin regimen in order to do this. We discussed doing it today but I am more concerned about the fact that he could have issues with uncontrolled bleeding and in that case we may have to send him to the ER. His daughter was not wanting to take that chance which I can completely understand. Nonetheless so far they have been using antibiotic ointment over the area I am just putting a protective dressing I do believe the patient is that he needs some type of compression. 07/07/2020 on evaluation today patient appears to be doing poorly in regard to his bilateral lower extremities today. He actually went to urgent care last night due to having pain in his legs due to the wraps. It appears they got somewhat tight on him because of the fact that he is having such significant swelling. He appears to be fluid overloaded I do not see any signs of infection actively at this point which is good news but unfortunately I think that this is something that may need to be addressed even by cardiology more so than just with a different or alternate type of wrap. I will contact his cardiologist, Dr. Mariah Milling, as well. 07/10/2020 upon evaluation today patient unfortunately appears to be doing worse even than when I saw him on Friday from the standpoint of where his legs stand. The main issue is  that he has increased erythema although there is still not warm to touch I am concerned about infection and about this getting significantly worse. Again my initial concern was more fluid overload and I discussed this with his cardiology office on Friday. With that being said I feel like this has gotten worse his daughter is in agreement and I think that the best option would be to send him to the hospital at this point. READMISSION 09/06/2020 Is a patient who has had 2 stays in this clinic firstly in 2020 and more recently from 06/02/2020 through 07/10/2020. He was cared for by [facility]. When he was last here he had bilateral lower extremity ulcers in the setting of chronic venous insufficiency with lymphedema. When he we last saw him he had weeping edema fluido Cellulitis and he was sent to the hospital. He was admitted initially from 06/30/2020 through 07/14/2020 with cellulitis plus stasis dermatitis. Treated empirically with vancomycin and had increases in his torsemide. He was then sent to the skilled level of [place] but readmitted to hospital from 07/29/2020 through 08/10/2020 with blood loss anemia hemoglobin of 4 and OB positive stools. During this hospitalization he was noted to have a sacral wound at stage II. He was sent to [facility] again in the skilled level. He had compression on his legs with Coban. He is now back  at his independent living setting. He has not been wearing compression and his daughter feels there is already increasing swelling. To have stockings at home not exactly sure at what strength. TREVEN, HOLTMAN A. ([id number]) Past medical history includes lymphedema, chronic venous insufficiency, pacemaker, congestive heart failure, hypertension, aortic valve stenosis status post mechanical valve replacement in 2002 on chronic Coumadin, chronic kidney disease stage IIIb, atrial fibrillation, peripheral neuropathy, large hiatal hernia. We did not test his  ABIs in the clinic today. Readmission: 10/17/2020 on evaluation today patient appears to be doing somewhat poorly in regard to his left leg and the fact that he does have an open wound at this point. He has not had the compression socks placed at the facility they have not been putting them on at all unfortunately. He does not have on the right leg currently and apparently there is significant put him on the left leg with the wound which I understand is that can be somewhat tight obviously. Nonetheless I do believe the right leg needs to have a compression sock and I do believe on the left side right now we can do something a little better in order to try to get the wound healed at this point. The patient is on torsemide and currently allopurinol for gout. He also has daily weights due to congestive heart failure I am assuming in order to monitor his fluid status overall. He is currently a DNR at the facility. 10/30/2020 upon evaluation today patient appears to be doing somewhat poorly at this time in regard to his bilateral lower extremities. His left leg which is the only place where he had a wound last week when I saw him is actually doing worse with new wound openings. The right leg also has a small wound as well. However the most concerning thing is that last week he did not have any signs of infection or erythema there is no warmth to touch over the leg. This week the entire leg is more sore he also has erythema extending from the ankle to just below the knee with the area being erythematous and warm to touch which I believe represents cellulitis at minimum. I am concerned about how this is spread so quickly and the fact that he is having increased pain and also not feeling as well. All in all I discussed with the patient that unfortunately I feel like he may be best served going to the ER for further evaluation of this issue currently to try to see about the potential for IV antibiotics and to  have appropriate lab work performed. His daughter is present during the visit today and she was included in the decision making at this point. 11/13/2020 upon evaluation today patient appears to be doing well with regard to his leg ulcer. Fortunately he does not appear to be showing signs of infection anything like it was previous. This is great news. He was sent home with IV antibiotic therapy that seems to have done extremely well for him. No fevers, chills, nausea, vomiting, or diarrhea. 11/27/2020 upon evaluation today patient appears to be doing quite well in regard to his wounds currently. Fortunately there is no signs of active infection at this time which is great news and I am very pleased in this regard. He did have some concerns or his daughter did about an area on his gluteal region. With that being said I see nothing that is actually open at this point which is great news I do  think continuing with a derma cloud is a good way to go. With regard to his leg he does have a new area on the right leg of open wound locations which is new compared to last visit we will need to address this as well. Otherwise he seems to be doing quite well in my opinion. 12/15/2020 upon evaluation today patient appears to be doing well with regard to his leg ulcer on the left. With that being said he did not have the Tubigrip on which I think is integral to getting this closed as he continues to weep. Subsequently I think the Tubigrip will help control some of the edema which will help him in that regard. Fortunately there is no signs of active infection at this time. No fevers, chills, nausea, vomiting, or diarrhea. 01/01/2021 upon evaluation today patient appears to be doing well with regard to his wound. This is measuring better and actually is significantly smaller. Fortunately there is no signs of active infection at this time. No fevers, chills, nausea, vomiting, or diarrhea. 01/15/2021 on evaluation today  patient appears to be doing well with regard to his left leg ulcer though he still continues to weep quite a bit he did not have the Tubigrip on the day that he supposed to. Fortunately there is no signs of active infection at this time. No fevers, chills, nausea, vomiting, or diarrhea. 01/29/2021 upon evaluation today patient appears to be doing well with regard to his right leg which is completely healed. His left leg though not healed is just a very small open area that is weeping. With regard to his gluteal region this is actually new and in fact he has a wound that is really in the sacral area. This is stated to be a stage II that may be the case there is really no slough buildup I Ernie Hew leave it as such for the time being. We will keep a close eye on things however and see how it proceeds. 02/12/2021 upon evaluation today patient appears to be doing excellent in regard to his leg ulcers. In fact everything appears to be closed as best I can tell at this point. I think he is ready to transition into his compression stocking. With that being said his gluteal region still is open although it appears to be fairly clean I think the collagen is still a good thing to do here. We will see how things continue to improve over the next week or so. Electronic Signature(s) Signed: 02/12/2021 1:07:48 PM By: Lenda Kelp PA-C Entered By: Lenda Kelp on 02/12/2021 13:07:48 [name] Quails ([id number]) -------------------------------------------------------------------------------- Physical Exam Details Patient Name: [name] A. Date of Service: 02/12/2021 12:30 PM Medical Record Number: [id number] Patient Account Number: [id number] Date of Birth/Sex: [birth year] (87 y.o. M) Treating RN: Yevonne Pax Primary Care Provider: [name] Other Clinician: Lolita Cram Referring Provider: [name] Treating Provider/Extender: Rowan Blase in Treatment:  16 Constitutional Well-nourished and well-hydrated in no acute distress. Respiratory normal breathing without difficulty. Psychiatric this patient is able to make decisions and demonstrates good insight into disease process. Alert and Oriented x 3. pleasant and cooperative. Notes Upon inspection patient's wound bed actually showed signs of good granulation epithelization at this point. There does not appear to be any evidence of slough buildup although I am concerned a little bit about the fact that he still has some irritation erythema around I think he still get a lot of pressure to this area spends most  of his time sitting. His daughter is can look into getting a Roho type cushion. Electronic Signature(s) Signed: 02/12/2021 1:08:11 PM By: Lenda Kelp PA-C Entered By: Lenda Kelp on 02/12/2021 13:08:11 [name] Quails ([id number]) -------------------------------------------------------------------------------- Physician Orders Details Patient Name: [name] A. Date of Service: 02/12/2021 12:30 PM Medical Record Number: [id number] Patient Account Number: [id number] Date of Birth/Sex: 08-Mar-1931 (87 y.o. M) Treating RN: Yevonne Pax Primary Care Provider: [name] Other Clinician: Lolita Cram Referring Provider: [name] Treating Provider/Extender: Rowan Blase in Treatment: [phone] Verbal / Phone Orders: No Diagnosis Coding ICD-10 Coding Code Description I89.0 Lymphedema, not elsewhere classified I87.2 Venous insufficiency (chronic) (peripheral) L97.822 Non-pressure chronic ulcer of other part of left lower leg with fat layer exposed L97.812 Non-pressure chronic ulcer of other part of right lower leg with fat layer exposed L89.152 Pressure ulcer of sacral region, stage 2 Z79.01 Long term (current) use of anticoagulants Z95.0 Presence of cardiac pacemaker I10 Essential (primary) hypertension I50.42 Chronic combined systolic (congestive) and diastolic  (congestive) heart failure I25.10 Atherosclerotic heart disease of native coronary artery without angina pectoris Follow-up Appointments o Return Appointment in 2 weeks. Edema Control - Lymphedema / Segmental Compressive Device / Other Bilateral Lower Extremities o Patient to wear own compression stockings. Remove compression stockings every night before going to bed and put on every morning when getting up. - bilat lower leg o Elevate legs to the level of the heart and pump ankles as often as possible o Elevate leg(s) parallel to the floor when sitting. Off-Loading o Turn and reposition every 2 hours Wound Treatment Wound #12 - Sacrum Cleanser: Normal Saline (Generic) 3 x Per Week/30 Days Discharge Instructions: Wash your hands with soap and water. Remove old dressing, discard into plastic bag and place into trash. Cleanse the wound with Normal Saline prior to applying a clean dressing using gauze sponges, not tissues or cotton balls. Do not scrub or use excessive force. Pat dry using gauze sponges, not tissue or cotton balls. Primary Dressing: Prisma 4.34 (in) (Generic) 3 x Per Week/30 Days Discharge Instructions: Moisten w/normal saline or sterile water; Cover wound as directed. Do not remove from wound bed. Secondary Dressing: Mepilex Border Flex, 4x4 (in/in) (Generic) 3 x Per Week/30 Days Discharge Instructions: Apply to wound as directed. Do not cut. Electronic Signature(s) Signed: 02/12/2021 4:49:49 PM By: Lenda Kelp PA-C Signed: 03/02/2021 8:11:40 AM By: Yevonne Pax RN Entered By: Yevonne Pax on 02/12/2021 13:01:16 [name] A. ([id number]) -------------------------------------------------------------------------------- Problem List Details Patient Name: [name] A. Date of Service: 02/12/2021 12:30 PM Medical Record Number: [id number] Patient Account Number: [id number] Date of Birth/Sex: 1931-01-23 (87 y.o. M) Treating RN: Yevonne Pax Primary Care  Provider: [name] Other Clinician: Lolita Cram Referring Provider: [name] Treating Provider/Extender: Rowan Blase in Treatment: 16 Active Problems ICD-10 Encounter Code Description Active Date MDM Diagnosis I89.0 Lymphedema, not elsewhere classified 10/17/2020 No Yes I87.2 Venous insufficiency (chronic) (peripheral) 10/17/2020 No Yes L97.822 Non-pressure chronic ulcer of other part of left lower leg with fat layer 10/17/2020 No Yes exposed L97.812 Non-pressure chronic ulcer of other part of right lower leg with fat layer 10/17/2020 No Yes exposed L89.152 Pressure ulcer of sacral region, stage 2 01/29/2021 No Yes Z79.01 Long term (current) use of anticoagulants 10/17/2020 No Yes Z95.0 Presence of cardiac pacemaker 10/17/2020 No Yes I10 Essential (primary) hypertension 10/17/2020 No Yes I50.42 Chronic combined systolic (congestive) and diastolic (congestive) heart 10/17/2020 No Yes failure I25.10 Atherosclerotic heart disease of native coronary  artery without angina 10/17/2020 No Yes pectoris Inactive Problems Resolved Problems Electronic Signature(s) Signed: 02/12/2021 12:56:46 PM By: Geni Bers, Mariano A. ([id number]) Entered By: Lenda Kelp on 02/12/2021 12:56:46 [name] A. ([id number]) -------------------------------------------------------------------------------- Progress Note Details Patient Name: [name] A. Date of Service: 02/12/2021 12:30 PM Medical Record Number: [id number] Patient Account Number: [id number] Date of Birth/Sex: 1931-06-16 (87 y.o. M) Treating RN: Yevonne Pax Primary Care Provider: [name] Other Clinician: Lolita Cram Referring Provider: [name] Treating Provider/Extender: Rowan Blase in Treatment: 16 Subjective Chief Complaint Information obtained from Patient Left LE Ulcer and Sacral Ulcer History of Present Illness (HPI) 04/21/2019 ADMISSION This is an independent  87 year old man who lives in the independent part of [address] [place]. He has a history of chronic lower extremity edema and wears compression stockings. He states about a month ago he was taking off the one on the right and pulled some skin off accidentally. He has had 2 small wounds on the right anterior and right medial lower extremity. I note looking through [facility] health link that he had multiple venous ultrasounds in 2015 and 16. These were DVT rule outs. He did have a Baker's cyst on the left. He has not not had a previous wound history although he did have a history of cellulitis in his legs. Past medical history; includes aortic stenosis status post mechanical AVR in 2007 on chronic Coumadin, lower extremity edema with a history of cellulitis, history of MRSA. ABIs in our clinic were 1.1 on the right 6/3; patient with predominantly venous insufficiency ulcers in the right lower leg probably some degree of lymphedema. He had to wounds last week. We put him in 3 layer compression. The nurses at [facility] are changing the dressing. The area laterally is healed but he still has a small very painful area on the anterior tibial area. He is on Coumadin because of mechanical aortic valve. 6/10; venous insufficiency ulcers on the right lower leg. He has some degree of lymphedema. We have been using 3 layer compression with silver collagen small wound. Dimensions are improved. I gave him doxycycline last week which he tolerated because of surrounding erythema. This is improved also 6/17; arrives in clinic today with portable silver collagen dressing tightly adherent to the wound. Also felt that the wrap was too tight 3 layer being changed at the [facility] 6/24; patient's wound is small still adherent debris over the surface however even under illumination it was hard to see what was still open here. We have been using silver collagen 7/1; the patient arrives in the clinic  today and the area on the right lower leg is healed. He has severe chronic venous insufficiency. He has 20/30 compression stockings. Readmission: 06/22/2020 on evaluation today patient presents for reevaluation here in our clinic although it has been a little over a year since we last saw him. He does have a history of lymphedema, venous insufficiency, anticoagulant therapy, he is on a pacemaker which is the reason for the anticoagulants, hypertension, and congestive heart failure. With that being said unfortunately he has been dealing with a wound of the left and right legs which has been giving him some trouble since arrival first 2021. That is in regard to the left leg ulcer. In regard to the right leg ulcer this is just a very small area that really I think will seal up quite nicely is more of the lymphedema opening than anything else. With that  being said unfortunately the left leg is quite significant. We actually have noted that the patient's been dealing with this for quite some time and I think that there is a chance we may want to consider biopsy. With that being said he is on Coumadin therefore we were not able to perform a biopsy at this point. I think that something however in the future that may be warranted we may just have to take him off of the current Coumadin regimen in order to do this. We discussed doing it today but I am more concerned about the fact that he could have issues with uncontrolled bleeding and in that case we may have to send him to the ER. His daughter was not wanting to take that chance which I can completely understand. Nonetheless so far they have been using antibiotic ointment over the area I am just putting a protective dressing I do believe the patient is that he needs some type of compression. 07/07/2020 on evaluation today patient appears to be doing poorly in regard to his bilateral lower extremities today. He actually went to urgent care last night due to  having pain in his legs due to the wraps. It appears they got somewhat tight on him because of the fact that he is having such significant swelling. He appears to be fluid overloaded I do not see any signs of infection actively at this point which is good news but unfortunately I think that this is something that may need to be addressed even by cardiology more so than just with a different or alternate type of wrap. I will contact his cardiologist, Dr. Mariah Milling, as well. 07/10/2020 upon evaluation today patient unfortunately appears to be doing worse even than when I saw him on Friday from the standpoint of where his legs stand. The main issue is that he has increased erythema although there is still not warm to touch I am concerned about infection and about this getting significantly worse. Again my initial concern was more fluid overload and I discussed this with his cardiology office on Friday. With that being said I feel like this has gotten worse his daughter is in agreement and I think that the best option would be to send him to the hospital at this point. READMISSION 09/06/2020 Is a patient who has had 2 stays in this clinic firstly in 2020 and more recently from 06/02/2020 through 07/10/2020. He was cared for by Allen Derry. When he was last here he had bilateral lower extremity ulcers in the setting of chronic venous insufficiency with lymphedema. When he we last saw him he had weeping edema fluido Cellulitis and he was sent to the hospital. He was admitted initially from 06/30/2020 through 07/14/2020 [facility] [place], Leticia A. ([id number]) with cellulitis plus stasis dermatitis. Treated empirically with vancomycin and had increases in his torsemide. He was then sent to the skilled level of [facility] but readmitted to hospital from 07/29/2020 through 08/10/2020 with blood loss anemia hemoglobin of 4 and OB positive stools. During this hospitalization he was noted to have a sacral wound at stage II.  He was sent to [facility] again in the skilled level. He had compression on his legs with Coban. He is now back at his independent living setting. He has not been wearing compression and his daughter feels there is already increasing swelling. To have stockings at home not exactly sure at what strength. Past medical history includes lymphedema, chronic venous insufficiency, pacemaker, congestive heart  failure, hypertension, aortic valve stenosis status post mechanical valve replacement in 2002 on chronic Coumadin, chronic kidney disease stage IIIb, atrial fibrillation, peripheral neuropathy, large hiatal hernia. We did not test his ABIs in the clinic today. Readmission: 10/17/2020 on evaluation today patient appears to be doing somewhat poorly in regard to his left leg and the fact that he does have an open wound at this point. He has not had the compression socks placed at the facility they have not been putting them on at all unfortunately. He does not have on the right leg currently and apparently there is significant put him on the left leg with the wound which I understand is that can be somewhat tight obviously. Nonetheless I do believe the right leg needs to have a compression sock and I do believe on the left side right now we can do something a little better in order to try to get the wound healed at this point. The patient is on torsemide and currently allopurinol for gout. He also has daily weights due to congestive heart failure I am assuming in order to monitor his fluid status overall. He is currently a DNR at the facility. 10/30/2020 upon evaluation today patient appears to be doing somewhat poorly at this time in regard to his bilateral lower extremities. His left leg which is the only place where he had a wound last week when I saw him is actually doing worse with new wound openings. The right leg also has a small wound as well. However the most concerning thing is that  last week he did not have any signs of infection or erythema there is no warmth to touch over the leg. This week the entire leg is more sore he also has erythema extending from the ankle to just below the knee with the area being erythematous and warm to touch which I believe represents cellulitis at minimum. I am concerned about how this is spread so quickly and the fact that he is having increased pain and also not feeling as well. All in all I discussed with the patient that unfortunately I feel like he may be best served going to the ER for further evaluation of this issue currently to try to see about the potential for IV antibiotics and to have appropriate lab work performed. His daughter is present during the visit today and she was included in the decision making at this point. 11/13/2020 upon evaluation today patient appears to be doing well with regard to his leg ulcer. Fortunately he does not appear to be showing signs of infection anything like it was previous. This is great news. He was sent home with IV antibiotic therapy that seems to have done extremely well for him. No fevers, chills, nausea, vomiting, or diarrhea. 11/27/2020 upon evaluation today patient appears to be doing quite well in regard to his wounds currently. Fortunately there is no signs of active infection at this time which is great news and I am very pleased in this regard. He did have some concerns or his daughter did about an area on his gluteal region. With that being said I see nothing that is actually open at this point which is great news I do think continuing with a derma cloud is a good way to go. With regard to his leg he does have a new area on the right leg of open wound locations which is new compared to last visit we will need to address this as well. Otherwise  he seems to be doing quite well in my opinion. 12/15/2020 upon evaluation today patient appears to be doing well with regard to his leg ulcer on the  left. With that being said he did not have the Tubigrip on which I think is integral to getting this closed as he continues to weep. Subsequently I think the Tubigrip will help control some of the edema which will help him in that regard. Fortunately there is no signs of active infection at this time. No fevers, chills, nausea, vomiting, or diarrhea. 01/01/2021 upon evaluation today patient appears to be doing well with regard to his wound. This is measuring better and actually is significantly smaller. Fortunately there is no signs of active infection at this time. No fevers, chills, nausea, vomiting, or diarrhea. 01/15/2021 on evaluation today patient appears to be doing well with regard to his left leg ulcer though he still continues to weep quite a bit he did not have the Tubigrip on the day that he supposed to. Fortunately there is no signs of active infection at this time. No fevers, chills, nausea, vomiting, or diarrhea. 01/29/2021 upon evaluation today patient appears to be doing well with regard to his right leg which is completely healed. His left leg though not healed is just a very small open area that is weeping. With regard to his gluteal region this is actually new and in fact he has a wound that is really in the sacral area. This is stated to be a stage II that may be the case there is really no slough buildup I Ernie Hew leave it as such for the time being. We will keep a close eye on things however and see how it proceeds. 02/12/2021 upon evaluation today patient appears to be doing excellent in regard to his leg ulcers. In fact everything appears to be closed as best I can tell at this point. I think he is ready to transition into his compression stocking. With that being said his gluteal region still is open although it appears to be fairly clean I think the collagen is still a good thing to do here. We will see how things continue to improve over the next week  or so. Objective Constitutional Well-nourished and well-hydrated in no acute distress. Vitals Time Taken: 12:46 PM, Temperature: 97.6 F, Pulse: 67 bpm, Respiratory Rate: 18 breaths/min, Blood Pressure: 136/63 mmHg. Phang, Parish A. ([id number]) Respiratory normal breathing without difficulty. Psychiatric this patient is able to make decisions and demonstrates good insight into disease process. Alert and Oriented x 3. pleasant and cooperative. General Notes: Upon inspection patient's wound bed actually showed signs of good granulation epithelization at this point. There does not appear to be any evidence of slough buildup although I am concerned a little bit about the fact that he still has some irritation erythema around I think he still get a lot of pressure to this area spends most of his time sitting. His daughter is can look into getting a Roho type cushion. Integumentary (Hair, Skin) Wound #12 status is Open. Original cause of wound was Pressure Injury. The date acquired was: 01/18/2021. The wound has been in treatment 2 weeks. The wound is located on the Sacrum. The wound measures 0.5cm length x 0.7cm width x 0.2cm depth; 0.275cm^2 area and 0.055cm^3 volume. There is Fat Layer (Subcutaneous Tissue) exposed. There is no tunneling or undermining noted. There is a medium amount of serous drainage noted. The wound margin is flat and intact. There is large (67-100%)  red, pink granulation within the wound bed. There is a small (1-33%) amount of necrotic tissue within the wound bed including Adherent Slough. Wound #8 status is Open. Original cause of wound was Gradually Appeared. The date acquired was: 09/25/2020. The wound has been in treatment 16 weeks. The wound is located on the Left,Medial Lower Leg. The wound measures 0cm length x 0cm width x 0cm depth; 0cm^2 area and 0cm^3 volume. There is no tunneling or undermining noted. There is a none present amount of drainage noted. The wound  margin is flat and intact. There is no granulation within the wound bed. There is no necrotic tissue within the wound bed. 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 Edema Control - Lymphedema / Segmental Compressive Device / Other: Patient to wear own compression stockings. Remove compression stockings every night before going to bed and put on every morning when getting up. - bilat lower leg Elevate legs to the level of the heart and pump ankles as often as possible Elevate leg(s) parallel to the floor when sitting. Off-Loading: Turn and reposition every 2 hours WOUND #12: - Sacrum Wound Laterality: Cleanser: Normal Saline (Generic) 3 x Per Week/30 Days Discharge Instructions: Wash your hands with soap and water. Remove old dressing, discard into plastic bag and place into trash. Cleanse the wound with Normal Saline prior to applying a clean dressing using gauze sponges, not tissues or cotton balls. Do not scrub or use excessive force. Pat dry using gauze sponges, not tissue or cotton balls. Primary Dressing: Prisma 4.34 (in) (Generic) 3 x Per Week/30 Days Discharge Instructions: Moisten w/normal saline or sterile water; Cover wound as directed. Do not remove from wound bed. Secondary Dressing: Mepilex Border Flex, 4x4 (in/in) (Generic) 3 x Per Week/30 Days Discharge Instructions: Apply to  wound as directed. Do not cut. 1. Would recommend currently that we going continue with the wound care measures as before and the patient is in agreement the plan this includes the use of silver collagen which I think is doing a good job for him. 2. I am also can recommend that we continue with the offloading. And the daughter is good to look into getting him a Roho cushion which I think would be beneficial. Howerton, Jachob A. ([id number]) 3. I am also going to suggest that he continue to monitor for any signs of worsening that would be done mainly by the nursing staff which is also him. We will see patient back for reevaluation in 2 weeks here in the clinic. If anything worsens or changes patient will contact our office for additional recommendations. Electronic Signature(s) Signed: 02/12/2021 1:09:06 PM By: Lenda Kelp PA-C Entered By: Lenda Kelp on 02/12/2021 13:09:06 [name] A. ([id number]) -------------------------------------------------------------------------------- SuperBill Details Patient Name: [name] A. Date of Service: 02/12/2021 Medical Record Number: [id number] Patient Account Number: [id number] Date of Birth/Sex: 01-05-1931 (87 y.o. M) Treating RN: Yevonne Pax Primary Care Provider: [name] Other Clinician: Lolita Cram Referring Provider: [name] Treating Provider/Extender: Allen Derry Weeks in Treatment: [phone]  Diagnosis Coding ICD-10 Codes Code Description I89.0 Lymphedema, not elsewhere classified I87.2 Venous insufficiency (chronic) (peripheral) L97.822 Non-pressure chronic ulcer of other part of left lower leg with fat layer exposed L97.812 Non-pressure chronic ulcer of other part of right lower leg with fat layer exposed L89.152 Pressure ulcer of sacral region, stage 2 Z79.01 Long term (current) use of anticoagulants Z95.0 Presence of cardiac pacemaker I10 Essential (primary) hypertension I50.42 Chronic combined systolic  (congestive) and diastolic (congestive) heart failure I25.10 Atherosclerotic heart disease of native coronary artery without angina pectoris Facility Procedures CPT4 Code: 12248250 Description: 99213 - WOUND CARE VISIT-LEV 3 EST PT Modifier: Quantity: 1 Physician Procedures CPT4 Code: 0370488 Description: 99213 - WC PHYS LEVEL 3 - EST PT Modifier: Quantity: 1 CPT4 Code: Description: ICD-10 Diagnosis Description I89.0 Lymphedema, not elsewhere classified I87.2 Venous insufficiency (chronic) (peripheral) L97.822 Non-pressure chronic ulcer of other part of left lower leg with fat lay L97.812 Non-pressure chronic ulcer of  other part of right lower leg with fat la Modifier: er exposed yer exposed Quantity: Electronic Signature(s) Signed: 02/12/2021 1:09:22 PM By: Lenda Kelp PA-C Entered By: Lenda Kelp on 02/12/2021 13:09:21

## 2021-02-17 IMAGING — DX DG ABDOMEN 1V
2 series · 2 of 2 positions shown · non-contrast
Comparison: CT 07/29/2020

CLINICAL DATA: Abdominal distention

EXAM:
ABDOMEN - 1 VIEW

[abdomen supine (1 of 2)]
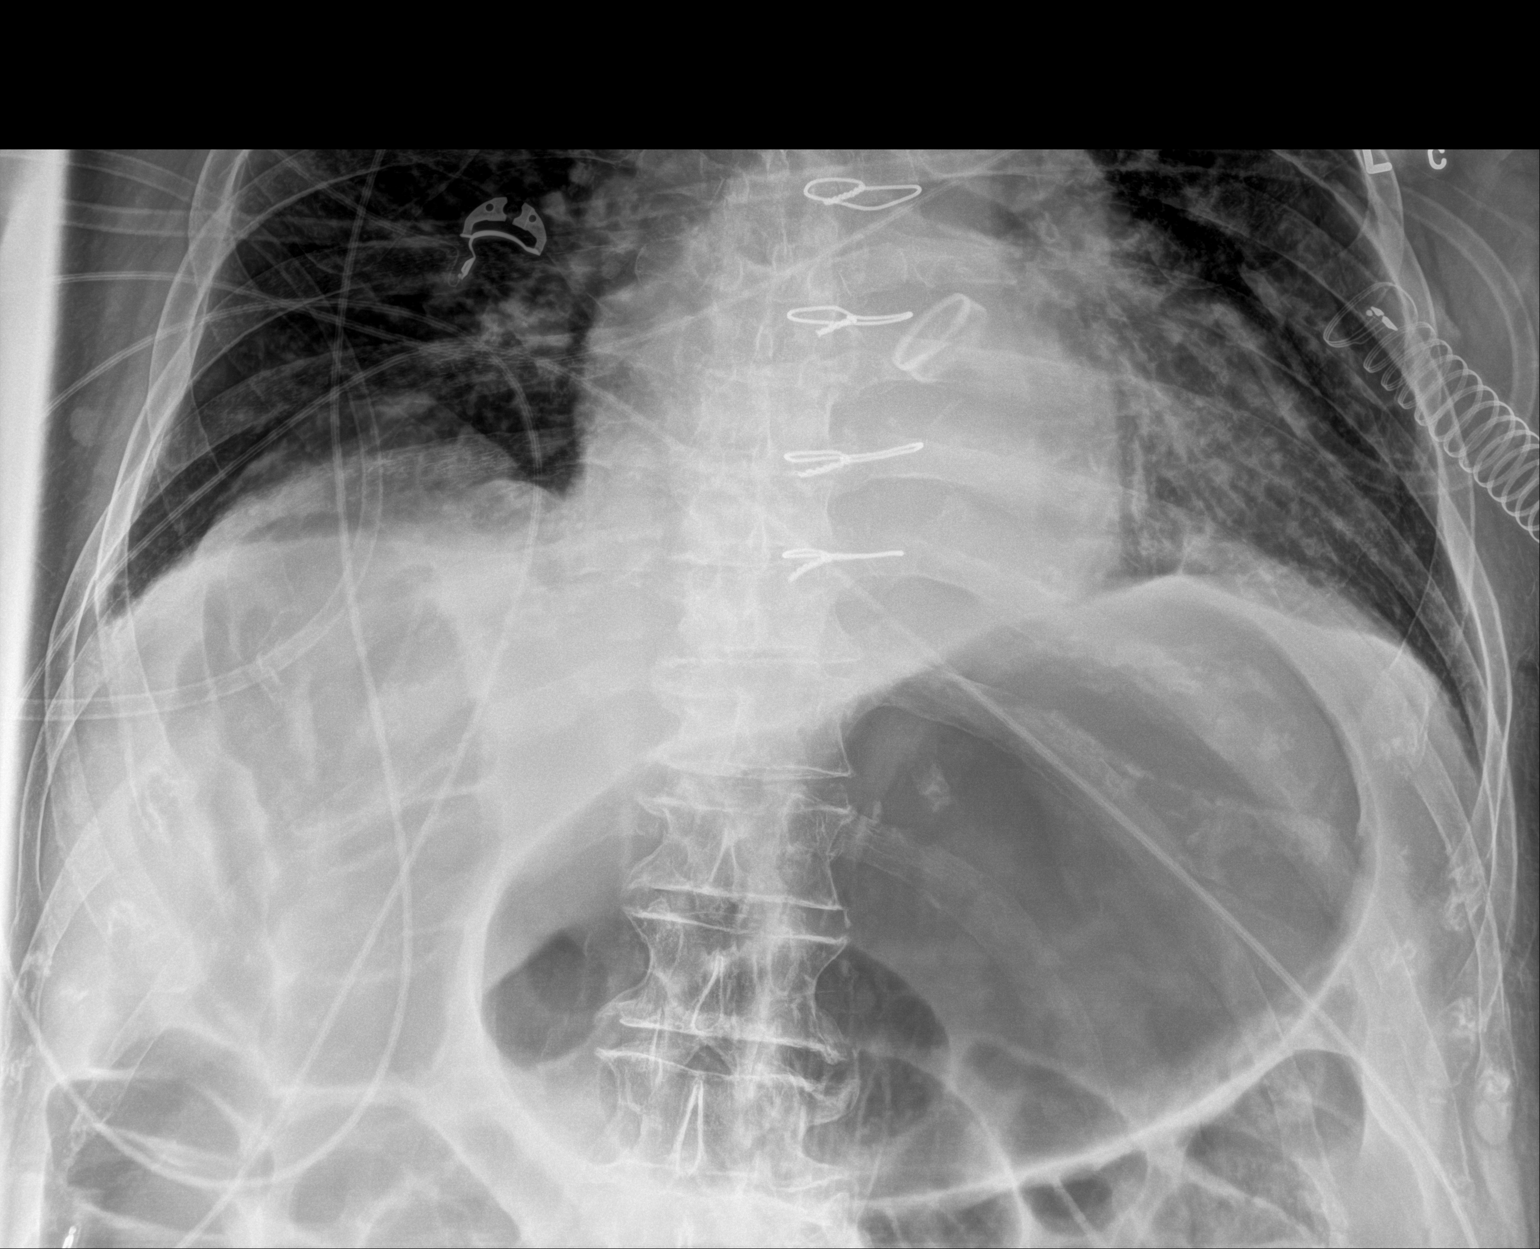

[abdomen supine (2 of 2)]
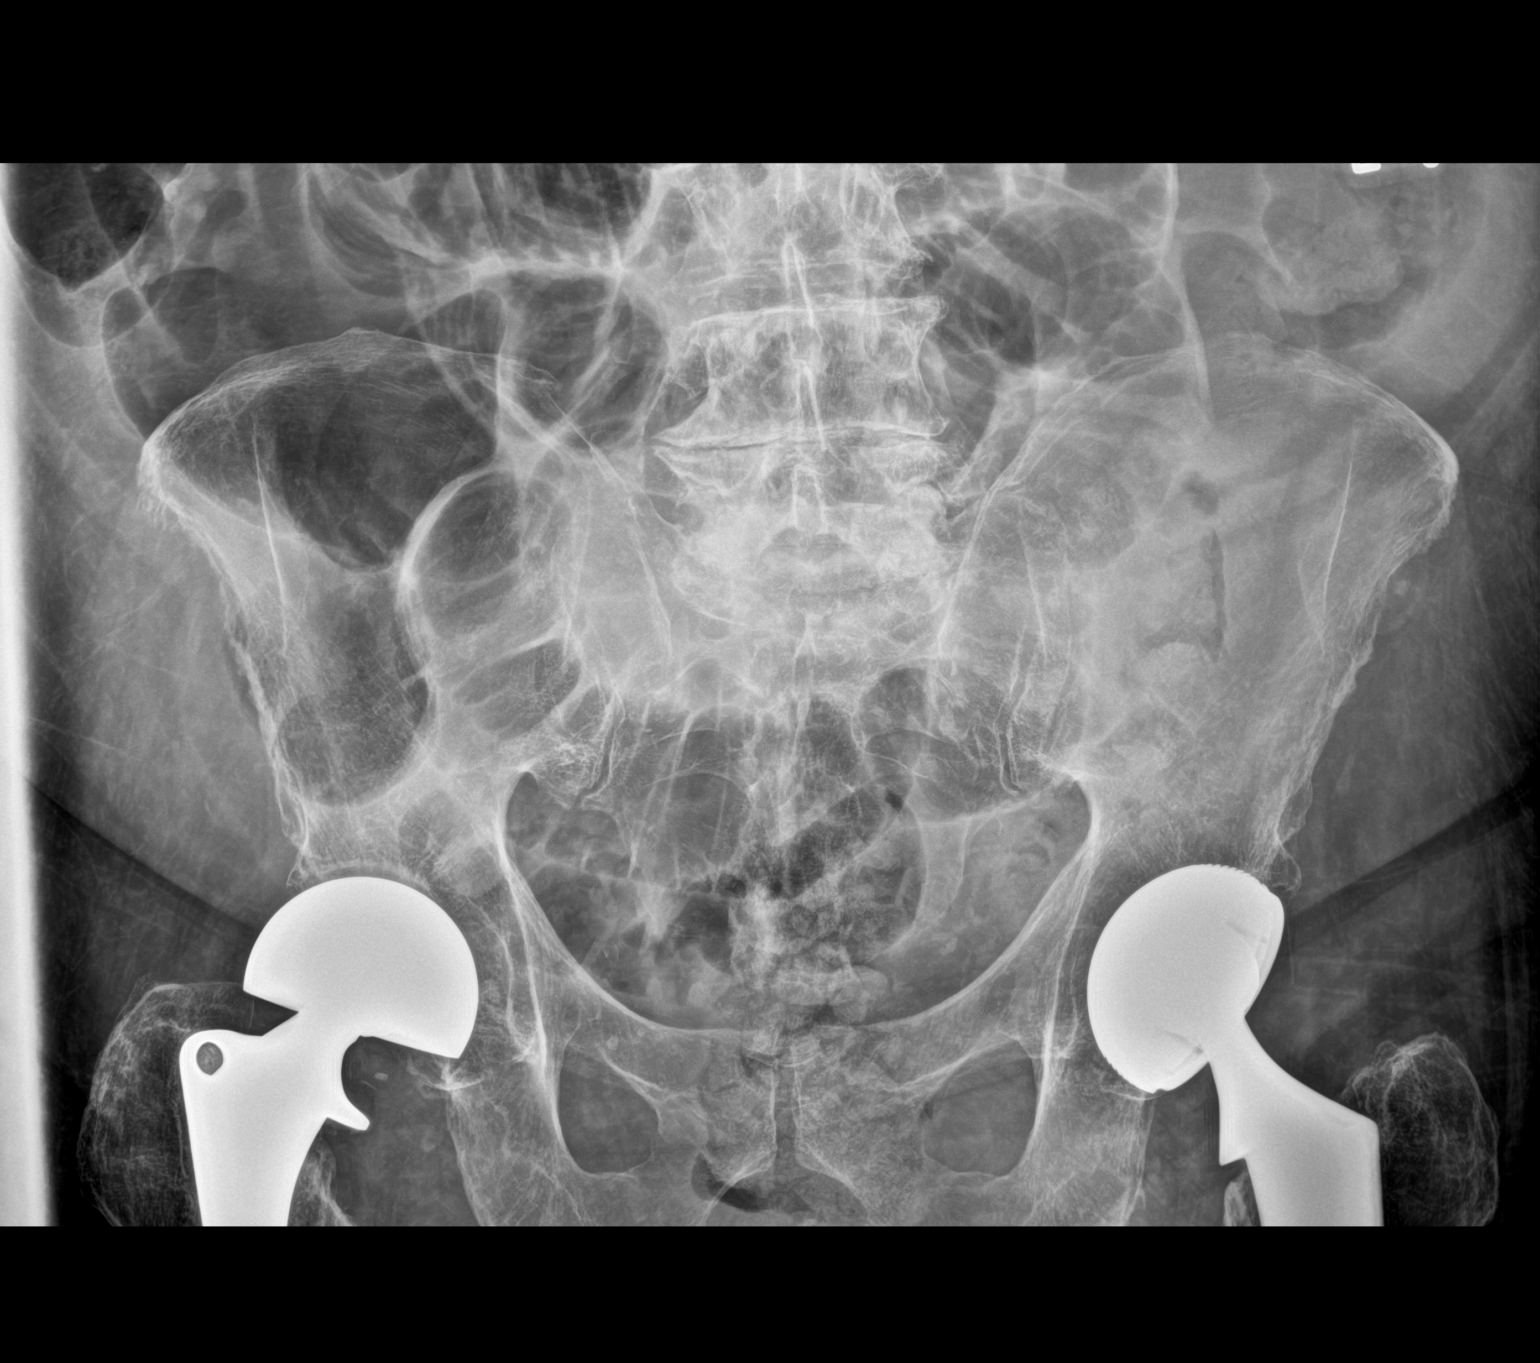

[2 of 2 positions shown; findings below may reference images not displayed]

FINDINGS: Diffuse gaseous distention of bowel, increasing since prior CT.
Marked gaseous distention of the stomach. Findings likely reflect
ileus. No free air organomegaly. Bilateral hip replacements and
degenerative changes in the lumbar spine.
IMPRESSION: Worsening diffuse gaseous distention of bowel with marked gaseous
distention of the stomach. Findings likely reflect ileus.

## 2021-02-19 ENCOUNTER — Other Ambulatory Visit
Admission: RE | Admit: 2021-02-19 | Discharge: 2021-02-19 | Disposition: A | Discharge status: Home / Self Care | Payer: Medicare Other | Source: Ambulatory Visit | Attending: Internal Medicine | Admitting: Internal Medicine

## 2021-02-19 DIAGNOSIS — I4891 Unspecified atrial fibrillation: Secondary | ICD-10-CM | POA: Insufficient documentation

## 2021-02-19 LAB — PROTIME-INR
INR: 1.9 — ABNORMAL HIGH (ref 0.8–1.2)
Prothrombin Time: 20.8 seconds — ABNORMAL HIGH (ref 11.4–15.2)

## 2021-02-23 ENCOUNTER — Encounter
Admission: RE | Admit: 2021-02-23 | Discharge: 2021-02-23 | Disposition: A | Discharge status: Home / Self Care | Payer: Medicare Other | Source: Ambulatory Visit | Attending: Internal Medicine | Admitting: Internal Medicine

## 2021-02-23 ENCOUNTER — Other Ambulatory Visit
Admission: RE | Admit: 2021-02-23 | Discharge: 2021-02-23 | Disposition: A | Discharge status: Home / Self Care | Payer: Medicare Other | Source: Skilled Nursing Facility | Attending: Internal Medicine | Admitting: Internal Medicine

## 2021-02-23 DIAGNOSIS — I509 Heart failure, unspecified: Secondary | ICD-10-CM | POA: Diagnosis not present

## 2021-02-23 DIAGNOSIS — J189 Pneumonia, unspecified organism: Secondary | ICD-10-CM | POA: Insufficient documentation

## 2021-02-23 DIAGNOSIS — I4891 Unspecified atrial fibrillation: Secondary | ICD-10-CM | POA: Diagnosis present

## 2021-02-23 LAB — CBC WITH DIFFERENTIAL/PLATELET
Abs Immature Granulocytes: 0.05 10*3/uL (ref 0.00–0.07)
Basophils Absolute: 0.1 10*3/uL (ref 0.0–0.1)
Basophils Relative: 1 %
Eosinophils Absolute: 0.1 10*3/uL (ref 0.0–0.5)
Eosinophils Relative: 1 %
HCT: 27.2 % — ABNORMAL LOW (ref 39.0–52.0)
Hemoglobin: 8.6 g/dL — ABNORMAL LOW (ref 13.0–17.0)
Immature Granulocytes: 1 %
Lymphocytes Relative: 12 %
Lymphs Abs: 0.8 10*3/uL (ref 0.7–4.0)
MCH: 27.8 pg (ref 26.0–34.0)
MCHC: 31.6 g/dL (ref 30.0–36.0)
MCV: 88 fL (ref 80.0–100.0)
Monocytes Absolute: 1.2 10*3/uL — ABNORMAL HIGH (ref 0.1–1.0)
Monocytes Relative: 17 %
Neutro Abs: 4.8 10*3/uL (ref 1.7–7.7)
Neutrophils Relative %: 68 %
Platelets: 222 10*3/uL (ref 150–400)
RBC: 3.09 MIL/uL — ABNORMAL LOW (ref 4.22–5.81)
RDW: 19.5 % — ABNORMAL HIGH (ref 11.5–15.5)
WBC: 7 10*3/uL (ref 4.0–10.5)
nRBC: 0 % (ref 0.0–0.2)

## 2021-02-23 LAB — BASIC METABOLIC PANEL
Anion gap: 13 (ref 5–15)
BUN: 58 mg/dL — ABNORMAL HIGH (ref 8–23)
CO2: 28 mmol/L (ref 22–32)
Calcium: 8.9 mg/dL (ref 8.9–10.3)
Chloride: 90 mmol/L — ABNORMAL LOW (ref 98–111)
Creatinine, Ser: 1.61 mg/dL — ABNORMAL HIGH (ref 0.61–1.24)
GFR, Estimated: 41 mL/min — ABNORMAL LOW (ref >60)
Glucose, Bld: 206 mg/dL — ABNORMAL HIGH (ref 70–99)
Potassium: 3.3 mmol/L — ABNORMAL LOW (ref 3.5–5.1)
Sodium: 131 mmol/L — ABNORMAL LOW (ref 135–145)

## 2021-02-23 LAB — PROTIME-INR
INR: 1.6 — ABNORMAL HIGH (ref 0.8–1.2)
Prothrombin Time: 18.4 seconds — ABNORMAL HIGH (ref 11.4–15.2)

## 2021-02-26 ENCOUNTER — Other Ambulatory Visit: Payer: Self-pay

## 2021-02-26 ENCOUNTER — Encounter: Payer: Medicare Other | Attending: Physician Assistant | Admitting: Physician Assistant

## 2021-02-26 DIAGNOSIS — I872 Venous insufficiency (chronic) (peripheral): Secondary | ICD-10-CM | POA: Insufficient documentation

## 2021-02-26 DIAGNOSIS — I890 Lymphedema, not elsewhere classified: Secondary | ICD-10-CM | POA: Diagnosis not present

## 2021-02-26 DIAGNOSIS — D500 Iron deficiency anemia secondary to blood loss (chronic): Secondary | ICD-10-CM | POA: Insufficient documentation

## 2021-02-26 DIAGNOSIS — E1122 Type 2 diabetes mellitus with diabetic chronic kidney disease: Secondary | ICD-10-CM | POA: Insufficient documentation

## 2021-02-26 DIAGNOSIS — I2510 Atherosclerotic heart disease of native coronary artery without angina pectoris: Secondary | ICD-10-CM | POA: Insufficient documentation

## 2021-02-26 DIAGNOSIS — I5042 Chronic combined systolic (congestive) and diastolic (congestive) heart failure: Secondary | ICD-10-CM | POA: Diagnosis not present

## 2021-02-26 DIAGNOSIS — L89152 Pressure ulcer of sacral region, stage 2: Secondary | ICD-10-CM | POA: Diagnosis not present

## 2021-02-26 DIAGNOSIS — L97822 Non-pressure chronic ulcer of other part of left lower leg with fat layer exposed: Secondary | ICD-10-CM | POA: Diagnosis not present

## 2021-02-26 DIAGNOSIS — G629 Polyneuropathy, unspecified: Secondary | ICD-10-CM | POA: Insufficient documentation

## 2021-02-26 DIAGNOSIS — Z952 Presence of prosthetic heart valve: Secondary | ICD-10-CM | POA: Diagnosis not present

## 2021-02-26 DIAGNOSIS — Z7901 Long term (current) use of anticoagulants: Secondary | ICD-10-CM | POA: Diagnosis not present

## 2021-02-26 DIAGNOSIS — N1832 Chronic kidney disease, stage 3b: Secondary | ICD-10-CM | POA: Diagnosis not present

## 2021-02-26 DIAGNOSIS — I130 Hypertensive heart and chronic kidney disease with heart failure and stage 1 through stage 4 chronic kidney disease, or unspecified chronic kidney disease: Secondary | ICD-10-CM | POA: Insufficient documentation

## 2021-02-26 DIAGNOSIS — Z950 Presence of cardiac pacemaker: Secondary | ICD-10-CM | POA: Insufficient documentation

## 2021-02-26 DIAGNOSIS — I350 Nonrheumatic aortic (valve) stenosis: Secondary | ICD-10-CM | POA: Diagnosis not present

## 2021-02-26 DIAGNOSIS — L97812 Non-pressure chronic ulcer of other part of right lower leg with fat layer exposed: Secondary | ICD-10-CM | POA: Insufficient documentation

## 2021-02-26 NOTE — Progress Notes (Addendum)
CADE, DASHNER ([id number]) Visit Report for 02/26/2021 Chief Complaint Document Details Patient Name: [name], [name] Date of Service: 02/26/2021 12:30 PM Medical Record Number: [id number] Patient Account Number: [id number] Date of Birth/Sex: Apr 17, 1931 (87 y.o. M) Treating RN: Yevonne Pax Primary Care Provider: [name] Other Clinician: Lolita Cram Referring Provider: [name] Treating Provider/Extender: Rowan Blase in Treatment: 18 Information Obtained from: Patient Chief Complaint Left LE Ulcer and Sacral Ulcer Electronic Signature(s) Signed: 02/26/2021 1:24:49 PM By: Lenda Kelp PA-C Entered By: Lenda Kelp on 02/26/2021 13:24:49 [name] Hilts A. ([id number]) -------------------------------------------------------------------------------- HPI Details Patient Name: [name] Hilts A. Date of Service: 02/26/2021 12:30 PM Medical Record Number: [id number] Patient Account Number: [id number] Date of Birth/Sex: 01-11-1931 (87 y.o. M) Treating RN: Yevonne Pax Primary Care Provider: [name] Other Clinician: Lolita Cram Referring Provider: [name] Treating Provider/Extender: Rowan Blase in Treatment: 18 History of Present Illness HPI Description: 04/21/2019 ADMISSION This is an independent 87 year old man who lives in the independent part of [address] of [place]. He has a history of chronic lower extremity edema and wears compression stockings. He states about a month ago he was taking off the one on the right and pulled some skin off accidentally. He has had 2 small wounds on the right anterior and right medial lower extremity. I note looking through [facility] health link that he had multiple venous ultrasounds in 2015 and 16. These were DVT rule outs. He did have a Baker's cyst on the left. He has not not had a previous wound history although he did have a history of cellulitis in his legs. Past medical history; includes aortic stenosis  status post mechanical AVR in 2007 on chronic Coumadin, lower extremity edema with a history of cellulitis, history of MRSA. ABIs in our clinic were 1.1 on the right 6/3; patient with predominantly venous insufficiency ulcers in the right lower leg probably some degree of lymphedema. He had to wounds last week. We put him in 3 layer compression. The nurses at [facility] are changing the dressing. The area laterally is healed but he still has a small very painful area on the anterior tibial area. He is on Coumadin because of mechanical aortic valve. 6/10; venous insufficiency ulcers on the right lower leg. He has some degree of lymphedema. We have been using 3 layer compression with silver collagen small wound. Dimensions are improved. I gave him doxycycline last week which he tolerated because of surrounding erythema. This is improved also 6/17; arrives in clinic today with portable silver collagen dressing tightly adherent to the wound. Also felt that the wrap was too tight 3 layer being changed at the [facility] 6/24; patient's wound is small still adherent debris over the surface however even under illumination it was hard to see what was still open here. We have been using silver collagen 7/1; the patient arrives in the clinic today and the area on the right lower leg is healed. He has severe chronic venous insufficiency. He has 20/30 compression stockings. Readmission: 06/22/2020 on evaluation today patient presents for reevaluation here in our clinic although it has been a little over a year since we last saw him. He does have a history of lymphedema, venous insufficiency, anticoagulant therapy, he is on a pacemaker which is the reason for the anticoagulants, hypertension, and congestive heart failure. With that being said unfortunately he has been dealing with a wound of the left and right legs which has been giving him some trouble since  arrival first 2021. That is in  regard to the left leg ulcer. In regard to the right leg ulcer this is just a very small area that really I think will seal up quite nicely is more of the lymphedema opening than anything else. With that being said unfortunately the left leg is quite significant. We actually have noted that the patient's been dealing with this for quite some time and I think that there is a chance we may want to consider biopsy. With that being said he is on Coumadin therefore we were not able to perform a biopsy at this point. I think that something however in the future that may be warranted we may just have to take him off of the current Coumadin regimen in order to do this. We discussed doing it today but I am more concerned about the fact that he could have issues with uncontrolled bleeding and in that case we may have to send him to the ER. His daughter was not wanting to take that chance which I can completely understand. Nonetheless so far they have been using antibiotic ointment over the area I am just putting a protective dressing I do believe the patient is that he needs some type of compression. 07/07/2020 on evaluation today patient appears to be doing poorly in regard to his bilateral lower extremities today. He actually went to urgent care last night due to having pain in his legs due to the wraps. It appears they got somewhat tight on him because of the fact that he is having such significant swelling. He appears to be fluid overloaded I do not see any signs of infection actively at this point which is good news but unfortunately I think that this is something that may need to be addressed even by cardiology more so than just with a different or alternate type of wrap. I will contact his cardiologist, Dr. Mariah MillingGollan, as well. 07/10/2020 upon evaluation today patient unfortunately appears to be doing worse even than when I saw him on Friday from the standpoint of where his legs stand. The main issue is that  he has increased erythema although there is still not warm to touch I am concerned about infection and about this getting significantly worse. Again my initial concern was more fluid overload and I discussed this with his cardiology office on Friday. With that being said I feel like this has gotten worse his daughter is in agreement and I think that the best option would be to send him to the hospital at this point. READMISSION 09/06/2020 Is a patient who has had 2 stays in this clinic firstly in 2020 and more recently from 06/02/2020 through 07/10/2020. He was cared for by [facility]. When he was last here he had bilateral lower extremity ulcers in the setting of chronic venous insufficiency with lymphedema. When he we last saw him he had weeping edema fluido Cellulitis and he was sent to the hospital. He was admitted initially from 06/30/2020 through 07/14/2020 with cellulitis plus stasis dermatitis. Treated empirically with vancomycin and had increases in his torsemide. He was then sent to the skilled level of [facility] but readmitted to hospital from 07/29/2020 through 08/10/2020 with blood loss anemia hemoglobin of 4 and OB positive stools. During this hospitalization he was noted to have a sacral wound at stage II. [facility] again in the skilled level. He had compression on his legs with Coban. He is now back  at his independent living setting. He has not been wearing compression and his daughter feels there is already increasing swelling. To have stockings at home not exactly sure at what strength. [name], [name] ([id number]) Past medical history includes lymphedema, chronic venous insufficiency, pacemaker, congestive heart failure, hypertension, aortic valve stenosis status post mechanical valve replacement in 2002 on chronic Coumadin, chronic kidney disease stage IIIb, atrial fibrillation, peripheral neuropathy, large hiatal hernia. We did not test his ABIs in  the clinic today. Readmission: 10/17/2020 on evaluation today patient appears to be doing somewhat poorly in regard to his left leg and the fact that he does have an open wound at this point. He has not had the compression socks placed at the facility they have not been putting them on at all unfortunately. He does not have on the right leg currently and apparently there is significant put him on the left leg with the wound which I understand is that can be somewhat tight obviously. Nonetheless I do believe the right leg needs to have a compression sock and I do believe on the left side right now we can do something a little better in order to try to get the wound healed at this point. The patient is on torsemide and currently allopurinol for gout. He also has daily weights due to congestive heart failure I am assuming in order to monitor his fluid status overall. He is currently a DNR at the facility. 10/30/2020 upon evaluation today patient appears to be doing somewhat poorly at this time in regard to his bilateral lower extremities. His left leg which is the only place where he had a wound last week when I saw him is actually doing worse with new wound openings. The right leg also has a small wound as well. However the most concerning thing is that last week he did not have any signs of infection or erythema there is no warmth to touch over the leg. This week the entire leg is more sore he also has erythema extending from the ankle to just below the knee with the area being erythematous and warm to touch which I believe represents cellulitis at minimum. I am concerned about how this is spread so quickly and the fact that he is having increased pain and also not feeling as well. All in all I discussed with the patient that unfortunately I feel like he may be best served going to the ER for further evaluation of this issue currently to try to see about the potential for IV antibiotics and to have  appropriate lab work performed. His daughter is present during the visit today and she was included in the decision making at this point. 11/13/2020 upon evaluation today patient appears to be doing well with regard to his leg ulcer. Fortunately he does not appear to be showing signs of infection anything like it was previous. This is great news. He was sent home with IV antibiotic therapy that seems to have done extremely well for him. No fevers, chills, nausea, vomiting, or diarrhea. 11/27/2020 upon evaluation today patient appears to be doing quite well in regard to his wounds currently. Fortunately there is no signs of active infection at this time which is great news and I am very pleased in this regard. He did have some concerns or his daughter did about an area on his gluteal region. With that being said I see nothing that is actually open at this point which is great news I do  think continuing with a derma cloud is a good way to go. With regard to his leg he does have a new area on the right leg of open wound locations which is new compared to last visit we will need to address this as well. Otherwise he seems to be doing quite well in my opinion. 12/15/2020 upon evaluation today patient appears to be doing well with regard to his leg ulcer on the left. With that being said he did not have the Tubigrip on which I think is integral to getting this closed as he continues to weep. Subsequently I think the Tubigrip will help control some of the edema which will help him in that regard. Fortunately there is no signs of active infection at this time. No fevers, chills, nausea, vomiting, or diarrhea. 01/01/2021 upon evaluation today patient appears to be doing well with regard to his wound. This is measuring better and actually is significantly smaller. Fortunately there is no signs of active infection at this time. No fevers, chills, nausea, vomiting, or diarrhea. 01/15/2021 on evaluation today patient  appears to be doing well with regard to his left leg ulcer though he still continues to weep quite a bit he did not have the Tubigrip on the day that he supposed to. Fortunately there is no signs of active infection at this time. No fevers, chills, nausea, vomiting, or diarrhea. 01/29/2021 upon evaluation today patient appears to be doing well with regard to his right leg which is completely healed. His left leg though not healed is just a very small open area that is weeping. With regard to his gluteal region this is actually new and in fact he has a wound that is really in the sacral area. This is stated to be a stage II that may be the case there is really no slough buildup I Ernie Hew leave it as such for the time being. We will keep a close eye on things however and see how it proceeds. 02/12/2021 upon evaluation today patient appears to be doing excellent in regard to his leg ulcers. In fact everything appears to be closed as best I can tell at this point. I think he is ready to transition into his compression stocking. With that being said his gluteal region still is open although it appears to be fairly clean I think the collagen is still a good thing to do here. We will see how things continue to improve over the next week or so. 02/26/2021 on evaluation today patient appears to be doing well with regard to his wound. He is tolerating the dressing changes without complication the wound is smaller though I feel like he is at the point where we need to try to do something to dry this up a little bit more I think switching from silver collagen to a silver alginate would be beneficial. Electronic Signature(s) Signed: 02/26/2021 1:32:59 PM By: Lenda Kelp PA-C Entered By: Lenda Kelp on 02/26/2021 13:32:58 [name] Hilts A. ([id number]) -------------------------------------------------------------------------------- Physical Exam Details Patient Name: [name] Hilts A. Date of Service: 02/26/2021  12:30 PM Medical Record Number: [id number] Patient Account Number: [id number] Date of Birth/Sex: [birth year] (87 y.o. M) Treating RN: Yevonne Pax Primary Care Provider: [name] Other Clinician: Lolita Cram Referring Provider: [name] Treating Provider/Extender: Rowan Blase in Treatment: 18 Constitutional Well-nourished and well-hydrated in no acute distress. Respiratory normal breathing without difficulty. Psychiatric this patient is able to make decisions and demonstrates good insight into disease process. Alert and  Oriented x 3. pleasant and cooperative. Notes Upon inspection patient's wound bed actually showed signs of good granulation epithelization. Overall I feel like he is doing much better although I think switching to silver alginate and away from the silver collagen would be the ideal thing to do based on what I am seeing currently. The patient is in agreement with that plan. His daughter also states agreement. Obviously he is being taken care of at the facility. Electronic Signature(s) Signed: 02/26/2021 1:33:22 PM By: Lenda Kelp PA-C Entered By: Lenda Kelp on 02/26/2021 13:33:22 [name] ([id number]) -------------------------------------------------------------------------------- Physician Orders Details Patient Name: [name] Hilts A. Date of Service: 02/26/2021 12:30 PM Medical Record Number: [id number] Patient Account Number: [id number] Date of Birth/Sex: January 21, 1931 (87 y.o. M) Treating RN: Yevonne Pax Primary Care Provider: [name] Other Clinician: Lolita Cram Referring Provider: [name] Treating Provider/Extender: Rowan Blase in Treatment: 18 Verbal / Phone Orders: No Diagnosis Coding ICD-10 Coding Code Description I89.0 Lymphedema, not elsewhere classified I87.2 Venous insufficiency (chronic) (peripheral) L97.822 Non-pressure chronic ulcer of other part of left lower leg with fat layer  exposed L97.812 Non-pressure chronic ulcer of other part of right lower leg with fat layer exposed L89.152 Pressure ulcer of sacral region, stage 2 Z79.01 Long term (current) use of anticoagulants Z95.0 Presence of cardiac pacemaker I10 Essential (primary) hypertension I50.42 Chronic combined systolic (congestive) and diastolic (congestive) heart failure I25.10 Atherosclerotic heart disease of native coronary artery without angina pectoris Follow-up Appointments o Return Appointment in 2 weeks. Edema Control - Lymphedema / Segmental Compressive Device / Other Bilateral Lower Extremities o Patient to wear own compression stockings. Remove compression stockings every night before going to bed and put on every morning when getting up. - bilat lower leg o Elevate legs to the level of the heart and pump ankles as often as possible o Elevate leg(s) parallel to the floor when sitting. Off-Loading o Turn and reposition every 2 hours Wound Treatment Wound #12 - Sacrum Cleanser: Normal Saline (Generic) 3 x Per Week/30 Days Discharge Instructions: Wash your hands with soap and water. Remove old dressing, discard into plastic bag and place into trash. Cleanse the wound with Normal Saline prior to applying a clean dressing using gauze sponges, not tissues or cotton balls. Do not scrub or use excessive force. Pat dry using gauze sponges, not tissue or cotton balls. Primary Dressing: Silvercel Small 2x2 (in/in) (Generic) 3 x Per Week/30 Days Discharge Instructions: Apply Silvercel Small 2x2 (in/in) as instructed Secondary Dressing: Mepilex Border Flex, 4x4 (in/in) (Generic) 3 x Per Week/30 Days Discharge Instructions: Apply to wound as directed. Do not cut. Electronic Signature(s) Signed: 02/26/2021 5:03:26 PM By: Lenda Kelp PA-C Signed: 03/02/2021 8:10:37 AM By: Yevonne Pax RN Entered By: Yevonne Pax on 02/26/2021 13:30:07 [name]  ([id number]) -------------------------------------------------------------------------------- Problem List Details Patient Name: [name] Hilts A. Date of Service: 02/26/2021 12:30 PM Medical Record Number: [id number] Patient Account Number: [id number] Date of Birth/Sex: 01-12-1931 (87 y.o. M) Treating RN: Yevonne Pax Primary Care Provider: [name] Other Clinician: Lolita Cram Referring Provider: [name] Treating Provider/Extender: Rowan Blase in Treatment: 18 Active Problems ICD-10 Encounter Code Description Active Date MDM Diagnosis I89.0 Lymphedema, not elsewhere classified 10/17/2020 No Yes I87.2 Venous insufficiency (chronic) (peripheral) 10/17/2020 No Yes L97.822 Non-pressure chronic ulcer of other part of left lower leg with fat layer 10/17/2020 No Yes exposed L97.812 Non-pressure chronic ulcer of other part of right lower leg with fat layer 10/17/2020 No Yes exposed L89.152  Pressure ulcer of sacral region, stage 2 01/29/2021 No Yes Z79.01 Long term (current) use of anticoagulants 10/17/2020 No Yes Z95.0 Presence of cardiac pacemaker 10/17/2020 No Yes I10 Essential (primary) hypertension 10/17/2020 No Yes I50.42 Chronic combined systolic (congestive) and diastolic (congestive) heart 10/17/2020 No Yes failure I25.10 Atherosclerotic heart disease of native coronary artery without angina 10/17/2020 No Yes pectoris Inactive Problems Resolved Problems Electronic Signature(s) Signed: 02/26/2021 1:24:40 PM By: Geni Bers, Jayton A. ([id number]) Entered By: Lenda Kelp on 02/26/2021 13:24:40 [name], [name] ([id number]) -------------------------------------------------------------------------------- Progress Note Details Patient Name: [name] Hilts A. Date of Service: 02/26/2021 12:30 PM Medical Record Number: [id number] Patient Account Number: [id number] Date of Birth/Sex: May 11, 1931 (87 y.o. M) Treating RN: Yevonne Pax Primary Care  Provider: [name] Other Clinician: Lolita Cram Referring Provider: [name] Treating Provider/Extender: Rowan Blase in Treatment: 18 Subjective Chief Complaint Information obtained from Patient Left LE Ulcer and Sacral Ulcer History of Present Illness (HPI) 04/21/2019 ADMISSION This is an independent 87 year old man who lives in the independent part of [address] of [place]. He has a history of chronic lower extremity edema and wears compression stockings. He states about a month ago he was taking off the one on the right and pulled some skin off accidentally. He has had 2 small wounds on the right anterior and right medial lower extremity. I note looking through [facility] health link that he had multiple venous ultrasounds in 2015 and 16. These were DVT rule outs. He did have a Baker's cyst on the left. He has not not had a previous wound history although he did have a history of cellulitis in his legs. Past medical history; includes aortic stenosis status post mechanical AVR in 2007 on chronic Coumadin, lower extremity edema with a history of cellulitis, history of MRSA. ABIs in our clinic were 1.1 on the right 6/3; patient with predominantly venous insufficiency ulcers in the right lower leg probably some degree of lymphedema. He had to wounds last week. We put him in 3 layer compression. The nurses at [facility] are changing the dressing. The area laterally is healed but he still has a small very painful area on the anterior tibial area. He is on Coumadin because of mechanical aortic valve. 6/10; venous insufficiency ulcers on the right lower leg. He has some degree of lymphedema. We have been using 3 layer compression with silver collagen small wound. Dimensions are improved. I gave him doxycycline last week which he tolerated because of surrounding erythema. This is improved also 6/17; arrives in clinic today with portable silver collagen dressing tightly  adherent to the wound. Also felt that the wrap was too tight 3 layer being changed at the [facility] 6/24; patient's wound is small still adherent debris over the surface however even under illumination it was hard to see what was still open here. We have been using silver collagen 7/1; the patient arrives in the clinic today and the area on the right lower leg is healed. He has severe chronic venous insufficiency. He has 20/30 compression stockings. Readmission: 06/22/2020 on evaluation today patient presents for reevaluation here in our clinic although it has been a little over a year since we last saw him. He does have a history of lymphedema, venous insufficiency, anticoagulant therapy, he is on a pacemaker which is the reason for the anticoagulants, hypertension, and congestive heart failure. With that being said unfortunately he has been dealing with a wound of the left and right  legs which has been giving him some trouble since arrival first 2021. That is in regard to the left leg ulcer. In regard to the right leg ulcer this is just a very small area that really I think will seal up quite nicely is more of the lymphedema opening than anything else. With that being said unfortunately the left leg is quite significant. We actually have noted that the patient's been dealing with this for quite some time and I think that there is a chance we may want to consider biopsy. With that being said he is on Coumadin therefore we were not able to perform a biopsy at this point. I think that something however in the future that may be warranted we may just have to take him off of the current Coumadin regimen in order to do this. We discussed doing it today but I am more concerned about the fact that he could have issues with uncontrolled bleeding and in that case we may have to send him to the ER. His daughter was not wanting to take that chance which I can completely understand. Nonetheless so  far they have been using antibiotic ointment over the area I am just putting a protective dressing I do believe the patient is that he needs some type of compression. 07/07/2020 on evaluation today patient appears to be doing poorly in regard to his bilateral lower extremities today. He actually went to urgent care last night due to having pain in his legs due to the wraps. It appears they got somewhat tight on him because of the fact that he is having such significant swelling. He appears to be fluid overloaded I do not see any signs of infection actively at this point which is good news but unfortunately I think that this is something that may need to be addressed even by cardiology more so than just with a different or alternate type of wrap. I will contact his cardiologist, Dr. Mariah Milling, as well. 07/10/2020 upon evaluation today patient unfortunately appears to be doing worse even than when I saw him on Friday from the standpoint of where his legs stand. The main issue is that he has increased erythema although there is still not warm to touch I am concerned about infection and about this getting significantly worse. Again my initial concern was more fluid overload and I discussed this with his cardiology office on Friday. With that being said I feel like this has gotten worse his daughter is in agreement and I think that the best option would be to send him to the hospital at this point. READMISSION 09/06/2020 Is a patient who has had 2 stays in this clinic firstly in 2020 and more recently from 06/02/2020 through 07/10/2020. He was cared for by Allen Derry. When he was last here he had bilateral lower extremity ulcers in the setting of chronic venous insufficiency with lymphedema. When he we last saw him he had weeping edema fluido Cellulitis and he was sent to the hospital. He was admitted initially from 06/30/2020 through 07/14/2020 [facility], Ganesh A. ([id number]) with cellulitis plus stasis dermatitis.  Treated empirically with vancomycin and had increases in his torsemide. He was then sent to the skilled level of [facility] but readmitted to hospital from 07/29/2020 through 08/10/2020 with blood loss anemia hemoglobin of 4 and OB positive stools. During this hospitalization he was noted to have a sacral wound at stage II. He was sent to [facility] again in the skilled  level. He had compression on his legs with Coban. He is now back at his independent living setting. He has not been wearing compression and his daughter feels there is already increasing swelling. To have stockings at home not exactly sure at what strength. Past medical history includes lymphedema, chronic venous insufficiency, pacemaker, congestive heart failure, hypertension, aortic valve stenosis status post mechanical valve replacement in 2002 on chronic Coumadin, chronic kidney disease stage IIIb, atrial fibrillation, peripheral neuropathy, large hiatal hernia. We did not test his ABIs in the clinic today. Readmission: 10/17/2020 on evaluation today patient appears to be doing somewhat poorly in regard to his left leg and the fact that he does have an open wound at this point. He has not had the compression socks placed at the facility they have not been putting them on at all unfortunately. He does not have on the right leg currently and apparently there is significant put him on the left leg with the wound which I understand is that can be somewhat tight obviously. Nonetheless I do believe the right leg needs to have a compression sock and I do believe on the left side right now we can do something a little better in order to try to get the wound healed at this point. The patient is on torsemide and currently allopurinol for gout. He also has daily weights due to congestive heart failure I am assuming in order to monitor his fluid status overall. He is currently a DNR at the facility. 10/30/2020 upon evaluation  today patient appears to be doing somewhat poorly at this time in regard to his bilateral lower extremities. His left leg which is the only place where he had a wound last week when I saw him is actually doing worse with new wound openings. The right leg also has a small wound as well. However the most concerning thing is that last week he did not have any signs of infection or erythema there is no warmth to touch over the leg. This week the entire leg is more sore he also has erythema extending from the ankle to just below the knee with the area being erythematous and warm to touch which I believe represents cellulitis at minimum. I am concerned about how this is spread so quickly and the fact that he is having increased pain and also not feeling as well. All in all I discussed with the patient that unfortunately I feel like he may be best served going to the ER for further evaluation of this issue currently to try to see about the potential for IV antibiotics and to have appropriate lab work performed. His daughter is present during the visit today and she was included in the decision making at this point. 11/13/2020 upon evaluation today patient appears to be doing well with regard to his leg ulcer. Fortunately he does not appear to be showing signs of infection anything like it was previous. This is great news. He was sent home with IV antibiotic therapy that seems to have done extremely well for him. No fevers, chills, nausea, vomiting, or diarrhea. 11/27/2020 upon evaluation today patient appears to be doing quite well in regard to his wounds currently. Fortunately there is no signs of active infection at this time which is great news and I am very pleased in this regard. He did have some concerns or his daughter did about an area on his gluteal region. With that being said I see nothing that is actually open at  this point which is great news I do think continuing with a derma cloud is a good way  to go. With regard to his leg he does have a new area on the right leg of open wound locations which is new compared to last visit we will need to address this as well. Otherwise he seems to be doing quite well in my opinion. 12/15/2020 upon evaluation today patient appears to be doing well with regard to his leg ulcer on the left. With that being said he did not have the Tubigrip on which I think is integral to getting this closed as he continues to weep. Subsequently I think the Tubigrip will help control some of the edema which will help him in that regard. Fortunately there is no signs of active infection at this time. No fevers, chills, nausea, vomiting, or diarrhea. 01/01/2021 upon evaluation today patient appears to be doing well with regard to his wound. This is measuring better and actually is significantly smaller. Fortunately there is no signs of active infection at this time. No fevers, chills, nausea, vomiting, or diarrhea. 01/15/2021 on evaluation today patient appears to be doing well with regard to his left leg ulcer though he still continues to weep quite a bit he did not have the Tubigrip on the day that he supposed to. Fortunately there is no signs of active infection at this time. No fevers, chills, nausea, vomiting, or diarrhea. 01/29/2021 upon evaluation today patient appears to be doing well with regard to his right leg which is completely healed. His left leg though not healed is just a very small open area that is weeping. With regard to his gluteal region this is actually new and in fact he has a wound that is really in the sacral area. This is stated to be a stage II that may be the case there is really no slough buildup I Ernie Hew leave it as such for the time being. We will keep a close eye on things however and see how it proceeds. 02/12/2021 upon evaluation today patient appears to be doing excellent in regard to his leg ulcers. In fact everything appears to be closed as best I  can tell at this point. I think he is ready to transition into his compression stocking. With that being said his gluteal region still is open although it appears to be fairly clean I think the collagen is still a good thing to do here. We will see how things continue to improve over the next week or so. 02/26/2021 on evaluation today patient appears to be doing well with regard to his wound. He is tolerating the dressing changes without complication the wound is smaller though I feel like he is at the point where we need to try to do something to dry this up a little bit more I think switching from silver collagen to a silver alginate would be beneficial. Objective Constitutional [name], [name] ([id number]) Well-nourished and well-hydrated in no acute distress. Vitals Time Taken: 1:00 AM, Temperature: 97.6 F, Pulse: 70 bpm, Respiratory Rate: 18 breaths/min, Blood Pressure: 112/57 mmHg. Respiratory normal breathing without difficulty. Psychiatric this patient is able to make decisions and demonstrates good insight into disease process. Alert and Oriented x 3. pleasant and cooperative. General Notes: Upon inspection patient's wound bed actually showed signs of good granulation epithelization. Overall I feel like he is doing much better although I think switching to silver alginate and away from the silver collagen would be the ideal  thing to do based on what I am seeing currently. The patient is in agreement with that plan. His daughter also states agreement. Obviously he is being taken care of at the facility. Integumentary (Hair, Skin) Wound #12 status is Open. Original cause of wound was Pressure Injury. The date acquired was: 01/18/2021. The wound has been in treatment 4 weeks. The wound is located on the Sacrum. The wound measures 0.5cm length x 0.3cm width x 0.1cm depth; 0.118cm^2 area and 0.012cm^3 volume. There is Fat Layer (Subcutaneous Tissue) exposed. There is no tunneling or  undermining noted. There is a small amount of serous drainage noted. The wound margin is flat and intact. There is large (67-100%) red, pink granulation within the wound bed. There is a small (1-33%) amount of necrotic tissue within the wound bed including Adherent Slough. 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 Edema Control - Lymphedema / Segmental Compressive Device / Other: Patient to wear own compression stockings. Remove compression stockings every night before going to bed and put on every morning when getting up. - bilat lower leg Elevate legs to the level of the heart and pump ankles as often as possible Elevate leg(s) parallel to the floor when sitting. Off-Loading: Turn and reposition every 2 hours WOUND #12: - Sacrum Wound Laterality: Cleanser: Normal Saline (Generic) 3 x Per Week/30 Days Discharge Instructions: Wash your hands with soap and water. Remove old dressing, discard into plastic bag and place into trash. Cleanse the wound with Normal Saline prior to applying a clean dressing using gauze sponges, not tissues or cotton balls. Do not scrub or use excessive force. Pat dry using gauze sponges, not tissue or cotton balls. Primary Dressing: Silvercel Small 2x2 (in/in) (Generic) 3 x Per Week/30 Days Discharge Instructions: Apply Silvercel Small 2x2 (in/in) as  instructed Secondary Dressing: Mepilex Border Flex, 4x4 (in/in) (Generic) 3 x Per Week/30 Days Discharge Instructions: Apply to wound as directed. Do not cut. 1. Would recommend currently that we continue with the wound care measures as before and the patient is in agreement with the plan that includes the use of the silver alginate dressing which I think is going to do a great job for him. 2. I am going to recommend that we continue with border foam dressing. 3. The patient is also going to continue with the Roho cushion which I think is optimal and actually is very comfortable for him as well. [name], [name] ([id number]) We will see patient back for reevaluation in 2 weeks here in the clinic. If anything worsens or changes patient will contact our office for additional recommendations. Electronic Signature(s) Signed: 02/26/2021 1:34:04 PM By: Lenda Kelp PA-C Entered By: Lenda Kelp on 02/26/2021 13:34:03 [name] Hilts A. ([id number]) -------------------------------------------------------------------------------- SuperBill Details Patient Name: [name] Hilts A. Date of Service: 02/26/2021 Medical Record Number: [id number] Patient Account Number: [id number] Date of Birth/Sex: 03-14-1931 (87 y.o. M) Treating RN: Yevonne Pax Primary Care Provider: [name] Other Clinician: Lolita Cram Referring Provider: [name] Treating Provider/Extender: Allen Derry Weeks in  Treatment: 18 Diagnosis Coding ICD-10 Codes Code Description I89.0 Lymphedema, not elsewhere classified I87.2 Venous insufficiency (chronic) (peripheral) L97.822 Non-pressure chronic ulcer of other part of left lower leg with fat layer exposed L97.812 Non-pressure chronic ulcer of other part of right lower leg with fat layer exposed L89.152 Pressure ulcer of sacral region, stage 2 Z79.01 Long term (current) use of anticoagulants Z95.0 Presence of cardiac pacemaker I10 Essential (primary)  hypertension I50.42 Chronic combined systolic (congestive) and diastolic (congestive) heart failure I25.10 Atherosclerotic heart disease of native coronary artery without angina pectoris Facility Procedures CPT4 Code: 62952841 Description: 99213 - WOUND CARE VISIT-LEV 3 EST PT Modifier: Quantity: 1 Physician Procedures CPT4 Code: 3244010 Description: 99213 - WC PHYS LEVEL 3 - EST PT Modifier: Quantity: 1 CPT4 Code: Description: ICD-10 Diagnosis Description I89.0 Lymphedema, not elsewhere classified I87.2 Venous insufficiency (chronic) (peripheral) L97.822 Non-pressure chronic ulcer of other part of left lower leg with fat lay L97.812 Non-pressure chronic ulcer of  other part of right lower leg with fat la Modifier: er exposed yer exposed Quantity: Electronic Signature(s) Signed: 02/26/2021 1:34:24 PM By: Lenda Kelp PA-C Entered By: Lenda Kelp on 02/26/2021 13:34:24

## 2021-02-28 ENCOUNTER — Other Ambulatory Visit: Payer: Self-pay

## 2021-02-28 ENCOUNTER — Non-Acute Institutional Stay: Payer: Medicare Other | Admitting: Adult Health Nurse Practitioner

## 2021-02-28 DIAGNOSIS — L89159 Pressure ulcer of sacral region, unspecified stage: Secondary | ICD-10-CM

## 2021-02-28 DIAGNOSIS — I739 Peripheral vascular disease, unspecified: Secondary | ICD-10-CM

## 2021-02-28 DIAGNOSIS — Z515 Encounter for palliative care: Secondary | ICD-10-CM

## 2021-02-28 DIAGNOSIS — I5032 Chronic diastolic (congestive) heart failure: Secondary | ICD-10-CM

## 2021-02-28 NOTE — Progress Notes (Signed)
Therapist, nutritional Palliative Care Consult Note Telephone: [phone]  Fax: [phone]  PATIENT NAME: [name] DOB: [birth year] MRN: [id number]  PRIMARY CARE PROVIDER:   Dr. Delano Metz PROVIDER:  Dr. Dareen Piano  RESPONSIBLE PARTY:   Daughter, Prudencio Burly [phone]  Chief complaint:  Follow up palliative visit  RECOMMENDATIONS and PLAN: 1.Advanced care planning. Patient is DNR.  Will call daughter to update on visit.  2.  CHF/PVD.  Patient is having increased edema.  However only trace edema noted to bilateral lower extremities today.  Patient has been started on Zaroxolyn with decreased edema.  Patient has been started on the Zaroxolyn every other day.  PCP monitoring closely.  3.  Chronic venous ulcer/sacral pressure ulcer.  Continue follow-up and recommendations by wound clinic.  Palliative continue to monitor for symptom management/decline and make recommendations as needed.  Follow-up in 8 to 10 weeks.  I spent 35 minutes providing this consultation, including time spent with patient, provider coordination, chart review, documentation. More than 50% of the time in this consultation was spent coordinating communication.   HISTORY OF PRESENT ILLNESS:  [name] is a 87 y.o. year old male with multiple medical problems including aortic stenosis w/valve replacement (on coumadin), CHF, HTN, CAD, venous stasis. Palliative Care was asked to help address goals of care.  Patient is having weight gain believed to be related to edema and is having abdominal distention.  Current weight 209.8 pounds with BMI of 30.1.  Patient has recently finished antibiotic for pneumonia.  Patient has CPAP at night.  Patient has required supplemental oxygen as needed throughout the day at 1 L.  Today he is satting in the 90s on room air.  Staff does report that he is to have a repeat chest x-ray in a few weeks.  Patient is complaining of  occasional pain in the left shoulder.  Does state that it started about a year ago when he was living in his apartment and he turned to turn on a switch.  Sitter with him at visit confirms this.  He does get relief with as needed Tylenol.  Rest of ROS asked and negative per patient and staff.  CODE STATUS: DNR  PPS: 40% HOSPICE ELIGIBILITY/DIAGNOSIS: TBD  PHYSICAL EXAM: HR60O2 96% on RA General: NAD, frail appearing Eyes: sclera anicteric and noninjected with no discharge noted Cardiovascular: regular rate and rhythm Pulmonary:Wheezing heard in LLL; normal respiratory effort Abdomen: soft, nontender, with some abdominal distention noted + bowel sounds GU: no suprapubic tenderness Extremities: no edema, no joint deformities; has wound to left lower leg, did not take off bandage for exam (being followed at wound clinic) Skin: no rasheson exposed skin; has pressure injury to sacrum (did not take off bandage during visit today, being followed at wound clinic) Neurological: Weakness; drowsiness   PAST MEDICAL HISTORY:  Past Medical History:  Diagnosis Date  . Anxiety 10/11  . Aortic stenosis    a. s/p mechcanical AVR, 2002; b. 10/2018 Echo: Triv AI, mean grad .  . Bradycardia    chronic, no symptoms 07/2010  . C. difficile colitis   . Carotid bruit    dopplers in past, no abnormalities  . Chronic diastolic CHF (congestive heart failure) (HCC)    a. Echo 03/2015: EF 60-65%, no RWMA, GR1DD, mild BAE, mild to mod MR, mod TR, PASP 65 mmHg; b. 01/2017 Echo: EF 55-60%, NRWMA, grade 1 diastolic dysfunction.  Normal functioning prosthetic aortic valve.  Mean gradient 50  mmHg.  Sev TR. PASP ; c. 10/2018 Echo: EF 55-60%, Triv AI, mod dil LA, mod-sev TR, PASP 35-40, mild to mod red RV fxn.  . Coronary artery disease    a. mild, cath, 08/2010; b. medically managed  . Decreased hearing    Right ear  . Depression   . Gastric ulcer   . GERD (gastroesophageal reflux disease)   .  Hypertension    BP higher than usual 04/19/10; amlodipine increased by telephone  . Mod-Sev Tricuspid regurgitation    a. 10/2018 Echo: Mod-Sev TR, PASP 35-28mmHg.  Marland Kitchen RLS (restless legs syndrome) 08/23/2015  . S/P AVR    a. [employer]. mechanical 2002; b. echo 08/2010 EF 60%, trival AI, mild MR, AVR working well; c. on longterm warfarin tx  . SOB (shortness of breath) 10/11   08/2010,Episodes at 5 AM, eventually felt to be anxiety, after complete workup including catheterization, pt greatly improved with anxiety meds 11/11    SOCIAL HX:  Social History   Tobacco Use  . Smoking status: Former Smoker    Types: Cigarettes    Quit date: 1979    Years since quitting: 43.2  . Smokeless tobacco: Never Used  Substance Use Topics  . Alcohol use: Yes    Alcohol/week: 3.0 standard drinks    Types: 3 Glasses of wine per week    ALLERGIES:  Allergies  Allergen Reactions  . Sulfa Antibiotics Other (See Comments)    Reaction:  Unknown   . Levofloxacin Nausea Only     PERTINENT MEDICATIONS:  Outpatient Encounter Medications as of 02/28/2021  Medication Sig  . Acetaminophen 500 MG capsule Take 2 capsules by mouth every 8 (eight) hours as needed.  Marland Kitchen allopurinol (ZYLOPRIM) 300 MG tablet Take 300 mg by mouth daily.   Marland Kitchen ALPRAZolam (XANAX) 0.25 MG tablet Take 1 tablet (0.25 mg total) by mouth daily as needed for anxiety.  . busPIRone (BUSPAR) 5 MG tablet Take 1 tablet (5 mg total) by mouth 2 (two) times daily.  . Cetirizine HCl 10 MG CAPS Take 10 mg by mouth daily.  . DULoxetine (CYMBALTA) 30 MG capsule Take 1 capsule (30 mg total) by mouth daily.  . finasteride (PROSCAR) 5 MG tablet Take 5 mg by mouth daily.    . metolazone (ZAROXOLYN) 2.5 MG tablet Take 1 tablet (2.5 mg total) by mouth 2 (two) times a week.  . metoprolol succinate (TOPROL-XL) 25 MG 24 hr tablet Take 0.5 tablets (12.5 mg total) by mouth daily.  . pantoprazole (PROTONIX) 40 MG tablet Take 40 mg by mouth daily.  . potassium chloride  (KLOR-CON) 10 MEQ tablet TAKE 3 TABLETS (30 MEQ) BY MOUTH DAILY. TAKE AN ADDITIONAL 2TABS ON THE DAYS YOU TAKE METOLAZONE (Patient taking differently: Take 30-50 mEq by mouth daily. )  . rOPINIRole (REQUIP) 2 MG tablet TAKE 1 TABLET BY MOUTH 4  TIMES DAILY (Patient taking differently: Take 2 mg by mouth in the morning, at noon, in the evening, and at bedtime. Take 1 tablet by mouth 4  times daily)  . torsemide (DEMADEX) 20 MG tablet Take 2 tablets (40 mg total) by mouth 2 (two) times daily.  . traZODone (DESYREL) 50 MG tablet Take 50 mg by mouth at bedtime.  Marland Kitchen warfarin (COUMADIN) 1 MG tablet Take 0.5 mg by mouth daily. Take along with 3 mg tablet for total 3.5 mg once daily  . warfarin (COUMADIN) 3 MG tablet Take 3 mg by mouth daily. Take along with 0.5 mg for total  3.5 mg once daily   No facility-administered encounter medications on file as of 02/28/2021.    PHYSICAL EXAM:   General: NAD, frail appearing, thin Cardiovascular: regular rate and rhythm Pulmonary: clear ant fields Abdomen: soft, nontender, + bowel sounds GU: no suprapubic tenderness Extremities: no edema, no joint deformities Skin: no rashes Neurological: Weakness but otherwise nonfocal  [name] Goodlin Marlena Clipper, NP

## 2021-03-01 ENCOUNTER — Telehealth: Payer: Self-pay | Admitting: Adult Health Nurse Practitioner

## 2021-03-01 NOTE — Telephone Encounter (Signed)
Called daughter to update on yesterday's visit.  Left VM with reason for call and call back info Dorise Gangi K. Ainara Eldridge NP 

## 2021-03-02 NOTE — Progress Notes (Signed)
[name], [name] ([id number]) Visit Report for 02/12/2021 Arrival Information Details Patient Name: [name], [name]. Date of Service: 02/12/2021 12:30 PM Medical Record Number: [id number] Patient Account Number: [id number] Date of Birth/Sex: 08/26/1931 (87 y.o. M) Treating RN: Yevonne Pax Primary Care Yanette Tripoli: [name] Other Clinician: Lolita Cram Referring Jaqwan Wieber: [name] Treating Jaque Dacy/Extender: Rowan Blase in Treatment: 16 Visit Information History Since Last Visit All ordered tests and consults were completed: No Patient Arrived: Wheel Chair Added or deleted any medications: No Arrival Time: 12:42 Any new allergies or adverse reactions: No Accompanied By: daughter Had [name] fall or experienced change in No Transfer Assistance: None activities of daily living that may affect Patient Identification Verified: Yes risk of falls: Secondary Verification Process Completed: Yes Signs or symptoms of abuse/neglect since last visito No Patient Requires Transmission-Based Precautions: No Hospitalized since last visit: No Patient Has Alerts: No Implantable device outside of the clinic excluding No cellular tissue based products placed in the center since last visit: Has Dressing in Place as Prescribed: Yes Pain Present Now: No Electronic Signature(s) Signed: 03/02/2021 8:11:40 AM By: Yevonne Pax RN Entered By: Yevonne Pax on 02/12/2021 12:43:05 [name]. ([id number]) -------------------------------------------------------------------------------- Clinic Level of Care Assessment Details Patient Name: [name]. Date of Service: 02/12/2021 12:30 PM Medical Record Number: [id number] Patient Account Number: [id number] Date of Birth/Sex: [birth year] (87 y.o. M) Treating RN: Yevonne Pax Primary Care Bascom Biel: [name] Other Clinician: Lolita Cram Referring Jay Haskew: [name] Treating Krish Bailly/Extender: Rowan Blase in  Treatment: 16 Clinic Level of Care Assessment Items TOOL 4 Quantity Score X - Use when only an EandM is performed on FOLLOW-UP visit 1 0 ASSESSMENTS - Nursing Assessment / Reassessment X - Reassessment of Co-morbidities (includes updates in patient status) 1 10 X- 1 5 Reassessment of Adherence to Treatment Plan ASSESSMENTS - Wound and Skin Assessment / Reassessment []  - Simple Wound Assessment / Reassessment - one wound 0 X- 2 5 Complex Wound Assessment / Reassessment - multiple wounds []  - 0 Dermatologic / Skin Assessment (not related to wound area) ASSESSMENTS - Focused Assessment []  - Circumferential Edema Measurements - multi extremities 0 []  - 0 Nutritional Assessment / Counseling / Intervention []  - 0 Lower Extremity Assessment (monofilament, tuning fork, pulses) []  - 0 Peripheral Arterial Disease Assessment (using hand held doppler) ASSESSMENTS - Ostomy and/or Continence Assessment and Care []  - Incontinence Assessment and Management 0 []  - 0 Ostomy Care Assessment and Management (repouching, etc.) PROCESS - Coordination of Care X - Simple Patient / Family Education for ongoing care 1 15 []  - 0 Complex (extensive) Patient / Family Education for ongoing care []  - 0 Staff obtains , Records, Test Results / Process Orders []  - 0 Staff telephones HHA, Nursing Homes / Clarify orders / etc []  - 0 Routine Transfer to another Facility (non-emergent condition) []  - 0 Routine Hospital Admission (non-emergent condition) []  - 0 New Admissions / / Ordering NPWT, Apligraf, etc. []  - 0 Emergency Hospital Admission (emergent condition) X- 1 10 Simple Discharge Coordination []  - 0 Complex (extensive) Discharge Coordination PROCESS - Special Needs []  - Pediatric / Minor Patient Management 0 []  - 0 Isolation Patient Management []  - 0 Hearing / Language / Visual special needs []  - 0 Assessment of Community assistance (transportation, D/C  planning, etc.) []  - 0 Additional assistance / Altered mentation []  - 0 Support Surface(s) Assessment (bed, cushion, seat, etc.) INTERVENTIONS - Wound Cleansing / Measurement Strebeck, Jaskirat [name]. ( ) []  - 0  Simple Wound Cleansing - one wound X- 2 5 Complex Wound Cleansing - multiple wounds X- 1 5 Wound Imaging (photographs - any number of wounds) []  - 0 Wound Tracing (instead of photographs) []  - 0 Simple Wound Measurement - one wound X- 2 5 Complex Wound Measurement - multiple wounds INTERVENTIONS - Wound Dressings X - Small Wound Dressing one or multiple wounds 1 10 []  - 0 Medium Wound Dressing one or multiple wounds []  - 0 Large Wound Dressing one or multiple wounds X- 1 5 Application of Medications - topical []  - 0 Application of Medications - injection INTERVENTIONS - Miscellaneous []  - External ear exam 0 []  - 0 Specimen Collection (cultures, biopsies, blood, body fluids, etc.) []  - 0 Specimen(s) / Culture(s) sent or taken to Lab for analysis []  - 0 Patient Transfer (multiple staff / Nurse, adultHoyer Lift / Similar devices) []  - 0 Simple Staple / Suture removal (25 or less) []  - 0 Complex Staple / Suture removal (26 or more) []  - 0 Hypo / Hyperglycemic Management (close monitor of Blood Glucose) []  - 0 Ankle / Brachial Index (ABI) - do not check if billed separately X- 1 5 Vital Signs Has the patient been seen at the hospital within the last three years: Yes Total Score: 95 Level Of Care: New/Established - Level 3 Electronic Signature(s) Signed: 03/02/2021 8:11:40 AM By: Yevonne PaxEpps, Carrie RN Entered By: Yevonne PaxEpps, Carrie on 02/12/2021 13:06:47 [name] HiltsSCHENK, Manley [name]. ([id number]) -------------------------------------------------------------------------------- Encounter Discharge Information Details Patient Name: [name] HiltsSCHENK, [name]. Date of Service: 02/12/2021 12:30 PM Medical Record Number: [id number] Patient Account Number: [id number] Date of Birth/Sex: Nov 25, 1931 (87 y.o.  M) Treating RN: Rogers BlockerSanchez, Kenia Primary Care Sylvi Rybolt: Marcelino DusterJohnston, John Other Clinician: Lolita CramBurnette, Kyara Referring Donna Snooks: Marcelino DusterJohnston, John Treating Herley Bernardini/Extender: Rowan BlaseStone, Hoyt Weeks in Treatment: 16 Encounter Discharge Information Items Discharge Condition: Stable Ambulatory Status: Wheelchair Discharge Destination: Skilled Nursing Facility Orders Sent: Yes Transportation: Private Auto Accompanied By: daughter Schedule Follow-up Appointment: Yes Clinical Summary of Care: Electronic Signature(s) Signed: 02/12/2021 3:13:12 PM By: Phillis HaggisSanchez Pereyda, Dondra PraderKenia RN Entered By: Phillis HaggisSanchez Pereyda, Dondra PraderKenia on 02/12/2021 15:13:12 [name] HiltsSCHENK, Lexander [name]. ([id number]) -------------------------------------------------------------------------------- Lower Extremity Assessment Details Patient Name: [name] HiltsSCHENK, Sahej [name]. Date of Service: 02/12/2021 12:30 PM Medical Record Number: [id number] Patient Account Number: [id number] Date of Birth/Sex: Nov 25, 1931 (87 y.o. M) Treating RN: Rogers BlockerSanchez, Kenia Primary Care Alizea Pell: Marcelino DusterJohnston, John Other Clinician: Lolita CramBurnette, Kyara Referring Wilberth Damon: Marcelino DusterJohnston, John Treating Amica Harron/Extender: Allen DerryStone, Hoyt Weeks in Treatment: 16 Edema Assessment Assessed: [Left: Yes] [Right: No] Edema: [Left: N] [Right: o] Calf Left: Right: Point of Measurement: 33 cm From Medial Instep 33 cm Ankle Left: Right: Point of Measurement: 10 cm From Medial Instep 21.5 cm Electronic Signature(s) Signed: 02/12/2021 4:25:39 PM By: Phillis HaggisSanchez Pereyda, Dondra PraderKenia RN Entered By: Phillis HaggisSanchez Pereyda, Kenia on 02/12/2021 12:54:54 [name] HiltsSCHENK, Saket [name]. ([id number]) -------------------------------------------------------------------------------- Multi Wound Chart Details Patient Name: [name] HiltsSCHENK, Tredarius [name]. Date of Service: 02/12/2021 12:30 PM Medical Record Number: [id number] Patient Account Number: [id number] Date of Birth/Sex: Nov 25, 1931 (87 y.o. M) Treating RN: Yevonne PaxEpps, Carrie Primary Care Ezell Melikian: Marcelino DusterJohnston, John Other  Clinician: Lolita CramBurnette, Kyara Referring Lachae Hohler: Marcelino DusterJohnston, John Treating Koden Hunzeker/Extender: Rowan BlaseStone, Hoyt Weeks in Treatment: 16 Vital Signs Height(in): Pulse(bpm): 67 Weight(lbs): Blood Pressure(mmHg): 136/63 Body Mass Index(BMI): Temperature(F): 97.6 Respiratory Rate(breaths/min): 18 Photos: [N/[name]:N/[name]] Wound Location: Sacrum Left, Medial Lower Leg N/[name] Wounding Event: Pressure Injury Gradually Appeared N/[name] Primary Etiology: Pressure Ulcer Venous Leg Ulcer N/[name] Comorbid History: Cataracts, Sleep Apnea, Congestive Cataracts, Sleep Apnea, Congestive N/[name] Heart Failure, Coronary Artery Heart Failure, Coronary Artery Disease, Hypertension, Disease, Hypertension, Osteoarthritis  Osteoarthritis Date Acquired: 01/18/2021 09/25/2020 N/[name] Weeks of Treatment: 2 16 N/[name] Wound Status: Open Open N/[name] Measurements L x W x D (cm) 0.5x0.7x0.2 0.1x0.1x0.1 N/[name] Area (cm) : 0.275 0.008 N/[name] Volume (cm) : 0.055 0.001 N/[name] % Reduction in Area: 16.70% 98.90% N/[name] % Reduction in Volume: 44.40% 98.60% N/[name] Classification: Category/Stage II Full Thickness Without Exposed N/[name] Support Structures Exudate Amount: Medium Small N/[name] Exudate Type: Serous Serous N/[name] Exudate Color: amber amber N/[name] Wound Margin: Flat and Intact Flat and Intact N/[name] Granulation Amount: Large (67-100%) None Present (0%) N/[name] Granulation Quality: Red, Pink N/[name] N/[name] Necrotic Amount: Small (1-33%) None Present (0%) N/[name] Exposed Structures: Fat Layer (Subcutaneous Tissue): Fat Layer (Subcutaneous Tissue): N/[name] Yes Yes Fascia: No Fascia: No Tendon: No Tendon: No Muscle: No Muscle: No Joint: No Joint: No Bone: No Bone: No Epithelialization: None Large (67-100%) N/[name] Treatment Notes Electronic Signature(s) Signed: 03/02/2021 8:11:40 AM By: Yevonne Pax RN Entered By: Yevonne Pax on 02/12/2021 12:59:27 [name]. ([id number]) -------------------------------------------------------------------------------- Multi-Disciplinary Care Plan  Details Patient Name: [name]. Date of Service: 02/12/2021 12:30 PM Medical Record Number: [id number] Patient Account Number: [id number] Date of Birth/Sex: [birth year] (87 y.o. M) Treating RN: Yevonne Pax Primary Care Timika Muench: [name] Other Clinician: Lolita Cram Referring Donnovan Stamour: [name] Treating Lambert Jeanty/Extender: Rowan Blase in Treatment: 16 Active Inactive Wound/Skin Impairment Nursing Diagnoses: Impaired tissue integrity Goals: Patient/caregiver will verbalize understanding of skin care regimen Date Initiated: 10/17/2020 Target Resolution Date: 02/26/2021 Goal Status: Active Ulcer/skin breakdown will have [name] volume reduction of 30% by week 4 Date Initiated: 10/17/2020 Date Inactivated: 01/01/2021 Target Resolution Date: 12/18/2019 Goal Status: Unmet Unmet Reason: comorbities Ulcer/skin breakdown will have [name] volume reduction of 50% by week 8 Date Initiated: 10/17/2020 Date Inactivated: 01/29/2021 Target Resolution Date: 01/18/2020 Goal Status: Unmet Unmet Reason: comorbities Ulcer/skin breakdown will have [name] volume reduction of 80% by week 12 Date Initiated: 10/17/2020 Target Resolution Date: 02/15/2020 Goal Status: Active Ulcer/skin breakdown will heal within 14 weeks Date Initiated: 10/17/2020 Target Resolution Date: 02/28/2021 Goal Status: Active Interventions: Assess patient/caregiver ability to obtain necessary supplies Assess ulceration(s) every visit Provide education on ulcer and skin care Treatment Activities: Skin care regimen initiated : 10/17/2020 Notes: Electronic Signature(s) Signed: 03/02/2021 8:11:40 AM By: Yevonne Pax RN Entered By: Yevonne Pax on 02/12/2021 12:59:18 Britten, Yasser [name]. ([id number]) -------------------------------------------------------------------------------- Pain Assessment Details Patient Name: [name]. Date of Service: 02/12/2021 12:30 PM Medical Record Number: [id number] Patient Account  Number: [id number] Date of Birth/Sex: 12/08/1930 (87 y.o. M) Treating RN: Rogers Blocker Primary Care Nariah Morgano: [name] Other Clinician: Lolita Cram Referring Decker Cogdell: [name] Treating Coryn Mosso/Extender: Rowan Blase in Treatment: 16 Active Problems Location of Pain Severity and Description of Pain Patient Has Paino No Site Locations Rate the pain. Current Pain Level: 0 Pain Management and Medication Current Pain Management: Electronic Signature(s) Signed: 02/12/2021 4:25:39 PM By: Phillis Haggis, Dondra Prader RN Entered By: Phillis Haggis, Kenia on 02/12/2021 12:50:38 Joellyn Quails ([id number]) -------------------------------------------------------------------------------- Patient/Caregiver Education Details Patient Name: [name]. Date of Service: 02/12/2021 12:30 PM Medical Record Number: [id number] Patient Account Number: [id number] Date of Birth/Gender: 11/16/1931 (87 y.o. M) Treating RN: Yevonne Pax Primary Care Physician: [name] Other Clinician: Lolita Cram Referring Physician: [name] Treating Physician/Extender: Rowan Blase in Treatment: 16 Education Assessment Education Provided To: Patient Education Topics Provided Wound/Skin Impairment: Methods: Explain/Verbal Responses: State content correctly Electronic Signature(s) Signed: 03/02/2021 8:11:40 AM By: Yevonne Pax RN Entered By: Yevonne Pax on 02/12/2021 13:07:59 Dever, Zamarian [name]. (  [id number]) -------------------------------------------------------------------------------- Wound Assessment Details Patient Name: ARSHAWN, VALDEZ [name]. Date of Service: 02/12/2021 12:30 PM Medical Record Number: [id number] Patient Account Number: [id number] Date of Birth/Sex: 1930/12/18 (87 y.o. M) Treating RN: Rogers Blocker Primary Care Karmen Altamirano: [name] Other Clinician: Lolita Cram Referring Joshiah Traynham: [name] Treating Tyller Bowlby/Extender: Allen Derry Weeks in  Treatment: 16 Wound Status Wound Number: 12 Primary Pressure Ulcer Etiology: Wound Location: Sacrum Wound Open Wounding Event: Pressure Injury Status: Date Acquired: 01/18/2021 Comorbid Cataracts, Sleep Apnea, Congestive Heart Failure, Weeks Of Treatment: 2 History: Coronary Artery Disease, Hypertension, Osteoarthritis Clustered Wound: No Photos Wound Measurements Length: (cm) 0.5 Width: (cm) 0.7 Depth: (cm) 0.2 Area: (cm) 0.275 Volume: (cm) 0.055 % Reduction in Area: 16.7% % Reduction in Volume: 44.4% Epithelialization: None Tunneling: No Undermining: No Wound Description Classification: Category/Stage II Wound Margin: Flat and Intact Exudate Amount: Medium Exudate Type: Serous Exudate Color: amber Foul Odor After Cleansing: No Slough/Fibrino Yes Wound Bed Granulation Amount: Large (67-100%) Exposed Structure Granulation Quality: Red, Pink Fascia Exposed: No Necrotic Amount: Small (1-33%) Fat Layer (Subcutaneous Tissue) Exposed: Yes Necrotic Quality: Adherent Slough Tendon Exposed: No Muscle Exposed: No Joint Exposed: No Bone Exposed: No Electronic Signature(s) Signed: 02/12/2021 4:25:39 PM By: Phillis Haggis, Dondra Prader RN Entered By: Phillis Haggis, Kenia on 02/12/2021 12:51:50 [name]. ([id number]) -------------------------------------------------------------------------------- Wound Assessment Details Patient Name: [name]. Date of Service: 02/12/2021 12:30 PM Medical Record Number: [id number] Patient Account Number: [id number] Date of Birth/Sex: 11-Aug-1931 (87 y.o. M) Treating RN: Yevonne Pax Primary Care Pancho Rushing: [name] Other Clinician: Lolita Cram Referring Abdikadir Fohl: [name] Treating Rommie Dunn/Extender: Allen Derry Weeks in Treatment: 16 Wound Status Wound Number: 8 Primary Venous Leg Ulcer Etiology: Wound Location: Left, Medial Lower Leg Wound Open Wounding Event: Gradually Appeared Status: Date Acquired:  09/25/2020 Comorbid Cataracts, Sleep Apnea, Congestive Heart Failure, Weeks Of Treatment: 16 History: Coronary Artery Disease, Hypertension, Osteoarthritis Clustered Wound: No Photos Wound Measurements Length: (cm) 0 Width: (cm) 0 Depth: (cm) 0 Area: (cm) Volume: (cm) % Reduction in Area: 100% % Reduction in Volume: 100% Epithelialization: Large (67-100%) 0 Tunneling: No 0 Undermining: No Wound Description Classification: Full Thickness Without Exposed Support Structures Wound Margin: Flat and Intact Exudate Amount: None Present Foul Odor After Cleansing: No Slough/Fibrino No Wound Bed Granulation Amount: None Present (0%) Exposed Structure Necrotic Amount: None Present (0%) Fascia Exposed: No Fat Layer (Subcutaneous Tissue) Exposed: No Tendon Exposed: No Muscle Exposed: No Joint Exposed: No Bone Exposed: No Electronic Signature(s) Signed: 03/02/2021 8:11:40 AM By: Yevonne Pax RN Entered By: Yevonne Pax on 02/12/2021 13:00:04 [name]. ([id number]) -------------------------------------------------------------------------------- Vitals Details Patient Name: [name]. Date of Service: 02/12/2021 12:30 PM Medical Record Number: [id number] Patient Account Number: [id number] Date of Birth/Sex: Aug 05, 1931 (87 y.o. M) Treating RN: Rogers Blocker Primary Care Leigh Blas: [name] Other Clinician: Lolita Cram Referring Lanitra Battaglini: [name] Treating Raynie Steinhaus/Extender: Rowan Blase in Treatment: 16 Vital Signs Time Taken: 12:46 Temperature (F): 97.6 Pulse (bpm): 67 Respiratory Rate (breaths/min): 18 Blood Pressure (mmHg): 136/63 Reference Range: 80 - 120 mg / dl Electronic Signature(s) Signed: 02/12/2021 4:25:39 PM By: Phillis Haggis, Dondra Prader RN Entered By: Phillis Haggis, Dondra Prader on 02/12/2021 12:47:24

## 2021-03-02 NOTE — Progress Notes (Signed)
[name], [name] ([id number]) Visit Report for 02/26/2021 Arrival Information Details Patient Name: [name], [name] Date of Service: 02/26/2021 12:30 PM Medical Record Number: [id number] Patient Account Number: [id number] Date of Birth/Sex: 11-Nov-1931 (87 y.o. M) Treating RN: Yevonne Pax Primary Care Leaira Fullam: [name] Other Clinician: Lolita Cram Referring Mardell Suttles: [name] Treating Annaleia Pence/Extender: Rowan Blase in Treatment: 18 Visit Information History Since Last Visit Added or deleted any medications: Yes Patient Arrived: Wheel Chair Had a fall or experienced change in Yes Arrival Time: 12:54 activities of daily living that may affect Accompanied By: daughter risk of falls: Transfer Assistance: EasyPivot Patient Implantable device outside of the clinic excluding No Lift cellular tissue based products placed in the center Patient Identification Verified: Yes since last visit: Secondary Verification Process Completed: Yes Pain Present Now: No Patient Requires Transmission-Based No Precautions: Patient Has Alerts: No Electronic Signature(s) Signed: 02/26/2021 4:17:01 PM By: Lolita Cram Entered By: Lolita Cram on 02/26/2021 13:05:30 [name] A. ([id number]) -------------------------------------------------------------------------------- Clinic Level of Care Assessment Details Patient Name: [name] A. Date of Service: 02/26/2021 12:30 PM Medical Record Number: [id number] Patient Account Number: [id number] Date of Birth/Sex: February 28, 1931 (87 y.o. M) Treating RN: Yevonne Pax Primary Care Stephannie Broner: [name] Other Clinician: Lolita Cram Referring Abriel Geesey: [name] Treating Jaclin Finks/Extender: Rowan Blase in Treatment: 18 Clinic Level of Care Assessment Items TOOL 4 Quantity Score X - Use when only an EandM is performed on FOLLOW-UP visit 1 0 ASSESSMENTS - Nursing Assessment / Reassessment X - Reassessment of  Co-morbidities (includes updates in patient status) 1 10 X- 1 5 Reassessment of Adherence to Treatment Plan ASSESSMENTS - Wound and Skin Assessment / Reassessment X - Simple Wound Assessment / Reassessment - one wound 1 5 []  - 0 Complex Wound Assessment / Reassessment - multiple wounds []  - 0 Dermatologic / Skin Assessment (not related to wound area) ASSESSMENTS - Focused Assessment []  - Circumferential Edema Measurements - multi extremities 0 []  - 0 Nutritional Assessment / Counseling / Intervention []  - 0 Lower Extremity Assessment (monofilament, tuning fork, pulses) []  - 0 Peripheral Arterial Disease Assessment (using hand held doppler) ASSESSMENTS - Ostomy and/or Continence Assessment and Care []  - Incontinence Assessment and Management 0 []  - 0 Ostomy Care Assessment and Management (repouching, etc.) PROCESS - Coordination of Care X - Simple Patient / Family Education for ongoing care 1 15 []  - 0 Complex (extensive) Patient / Family Education for ongoing care []  - 0 Staff obtains , Records, Test Results / Process Orders []  - 0 Staff telephones HHA, Nursing Homes / Clarify orders / etc []  - 0 Routine Transfer to another Facility (non-emergent condition) []  - 0 Routine Hospital Admission (non-emergent condition) []  - 0 New Admissions / / Ordering NPWT, Apligraf, etc. []  - 0 Emergency Hospital Admission (emergent condition) X- 1 10 Simple Discharge Coordination []  - 0 Complex (extensive) Discharge Coordination PROCESS - Special Needs []  - Pediatric / Minor Patient Management 0 []  - 0 Isolation Patient Management []  - 0 Hearing / Language / Visual special needs []  - 0 Assessment of Community assistance (transportation, D/C planning, etc.) []  - 0 Additional assistance / Altered mentation []  - 0 Support Surface(s) Assessment (bed, cushion, seat, etc.) INTERVENTIONS - Wound Cleansing / Measurement [name], [name] ( ) X- 1  5 Simple Wound Cleansing - one wound []  - 0 Complex Wound Cleansing - multiple wounds X- 1 5 Wound Imaging (photographs - any number of wounds) []  - 0 Wound Tracing (instead of photographs)  X- 1 5 Simple Wound Measurement - one wound []  - 0 Complex Wound Measurement - multiple wounds INTERVENTIONS - Wound Dressings X - Small Wound Dressing one or multiple wounds 1 10 []  - 0 Medium Wound Dressing one or multiple wounds []  - 0 Large Wound Dressing one or multiple wounds X- 1 5 Application of Medications - topical []  - 0 Application of Medications - injection INTERVENTIONS - Miscellaneous []  - External ear exam 0 []  - 0 Specimen Collection (cultures, biopsies, blood, body fluids, etc.) []  - 0 Specimen(s) / Culture(s) sent or taken to Lab for analysis []  - 0 Patient Transfer (multiple staff / / Similar devices) []  - 0 Simple Staple / Suture removal (25 or less) []  - 0 Complex Staple / Suture removal (26 or more) []  - 0 Hypo / Hyperglycemic Management (close monitor of Blood Glucose) []  - 0 Ankle / Brachial Index (ABI) - do not check if billed separately X- 1 5 Vital Signs Has the patient been seen at the hospital within the last three years: Yes Total Score: 80 Level Of Care: New/Established - Level 3 Electronic Signature(s) Signed: 03/02/2021 8:10:37 AM By: RN Entered By: on 02/26/2021 13:30:56 A. ( ) -------------------------------------------------------------------------------- Encounter Discharge Information Details Patient Name: A. Date of Service: 02/26/2021 12:30 PM Medical Record Number: Patient Account Number: Date of Birth/Sex: 1931-02-28 (87 y.o. M) Treating RN: 05/02/2021 Primary Care Antavious Spanos: Yevonne Pax Other Clinician: Yevonne Pax Referring Jocelyn Lowery: 04/28/2021 Treating Shaurya Rawdon/Extender: [name] in Treatment: 18 Encounter  Discharge Information Items Discharge Condition: Stable Ambulatory Status: Wheelchair Discharge Destination: Skilled Nursing Facility Orders Sent: Yes Transportation: Private Auto Accompanied By: daughter Schedule Follow-up Appointment: Yes Clinical Summary of Care: Electronic Signature(s) Signed: 02/26/2021 4:55:51 PM By: 04/28/2021, [id number] RN Entered By: [id number], 09/14/1931 on 02/26/2021 13:41:41 [name] Blocker A. ([name]) -------------------------------------------------------------------------------- Lower Extremity Assessment Details Patient Name: Lolita Cram A. Date of Service: 02/26/2021 12:30 PM Medical Record Number: Rowan Blase Patient Account Number: [id number] Date of Birth/Sex: Jun 12, 1931 (87 y.o. M) Treating RN: Dondra Prader Primary Care Ariq Khamis: Phillis Haggis Other Clinician: Dondra Prader Referring Floyed Masoud: 04/28/2021 Treating Aalyssa Elderkin/Extender: [name] Weeks in Treatment: 18 Electronic Signature(s) Signed: 02/26/2021 4:17:01 PM By: [name] Signed: 03/02/2021 8:10:37 AM By: [id number] RN Entered By: [id number] on 02/26/2021 13:11:59 [name], [name] (06-11-1977) -------------------------------------------------------------------------------- Multi Wound Chart Details Patient Name: Yevonne Pax A. Date of Service: 02/26/2021 12:30 PM Medical Record Number: Lolita Cram Patient Account Number: [name] Date of Birth/Sex: 28-Jan-1931 (87 y.o. M) Treating RN: Lolita Cram Primary Care Aniyha Tate: 05/02/2021 Other Clinician: Yevonne Pax Referring Darik Massing: Lolita Cram Treating Imad Shostak/Extender: 04/28/2021 in Treatment: 18 Vital Signs Height(in): Pulse(bpm): 70 Weight(lbs): Blood Pressure(mmHg): 112/57 Body Mass Index(BMI): Temperature(F): 97.6 Respiratory Rate(breaths/min): 18 Photos: [N/A:N/A] Wound Location: Sacrum N/A N/A Wounding Event: Pressure Injury N/A N/A Primary Etiology: Pressure Ulcer N/A  N/A Comorbid History: Cataracts, Sleep Apnea, Congestive N/A N/A Heart Failure, Coronary Artery Disease, Hypertension, Osteoarthritis Date Acquired: 01/18/2021 N/A N/A Weeks of Treatment: 4 N/A N/A Wound Status: Open N/A N/A Measurements L x W x D (cm) 0.5x0.3x0.1 N/A N/A Area (cm) : 0.118 N/A N/A Volume (cm) : 0.012 N/A N/A % Reduction in Area: 64.20% N/A N/A % Reduction in Volume: 87.90% N/A N/A Classification: Category/Stage II N/A N/A Exudate Amount: Small N/A N/A Exudate Type: Serous N/A N/A Exudate Color: amber N/A N/A Wound Margin: Flat and Intact N/A N/A Granulation Amount: Large (  67-100%) N/A N/A Granulation Quality: Red, Pink N/A N/A Necrotic Amount: Small (1-33%) N/A N/A Exposed Structures: Fat Layer (Subcutaneous Tissue): N/A N/A Yes Fascia: No Tendon: No Muscle: No Joint: No Bone: No Epithelialization: None N/A N/A Treatment Notes Electronic Signature(s) Signed: 03/02/2021 8:10:37 AM By: Yevonne PaxEpps, Carrie RN Entered By: Yevonne PaxEpps, Carrie on 02/26/2021 13:28:34 [name], [name] ([id number]) -------------------------------------------------------------------------------- Multi-Disciplinary Care Plan Details Patient Name: [name] HiltsSCHENK, [name] Date of Service: 02/26/2021 12:30 PM Medical Record Number: [id number] Patient Account Number: [id number] Date of Birth/Sex: 1931-03-29 (87 y.o. M) Treating RN: Yevonne PaxEpps, Carrie Primary Care Jasher Barkan: Marcelino DusterJohnston, John Other Clinician: Lolita CramBurnette, Kyara Referring Harlo Jaso: Marcelino DusterJohnston, John Treating Marlo Arriola/Extender: Rowan BlaseStone, Hoyt Weeks in Treatment: 18 Active Inactive Wound/Skin Impairment Nursing Diagnoses: Impaired tissue integrity Goals: Patient/caregiver will verbalize understanding of skin care regimen Date Initiated: 10/17/2020 Target Resolution Date: 02/26/2021 Goal Status: Active Ulcer/skin breakdown will have a volume reduction of 30% by week 4 Date Initiated: 10/17/2020 Date Inactivated: 01/01/2021 Target Resolution Date:  12/18/2019 Goal Status: Unmet Unmet Reason: comorbities Ulcer/skin breakdown will have a volume reduction of 50% by week 8 Date Initiated: 10/17/2020 Date Inactivated: 01/29/2021 Target Resolution Date: 01/18/2020 Goal Status: Unmet Unmet Reason: comorbities Ulcer/skin breakdown will have a volume reduction of 80% by week 12 Date Initiated: 10/17/2020 Target Resolution Date: 02/15/2020 Goal Status: Active Ulcer/skin breakdown will heal within 14 weeks Date Initiated: 10/17/2020 Target Resolution Date: 02/28/2021 Goal Status: Active Interventions: Assess patient/caregiver ability to obtain necessary supplies Assess ulceration(s) every visit Provide education on ulcer and skin care Treatment Activities: Skin care regimen initiated : 10/17/2020 Notes: Electronic Signature(s) Signed: 03/02/2021 8:10:37 AM By: Yevonne PaxEpps, Carrie RN Entered By: Yevonne PaxEpps, Carrie on 02/26/2021 13:28:25 [name] HiltsSCHENK, [name] ([id number]) -------------------------------------------------------------------------------- Pain Assessment Details Patient Name: [name] HiltsSCHENK, [name] Date of Service: 02/26/2021 12:30 PM Medical Record Number: [id number] Patient Account Number: [id number] Date of Birth/Sex: 1931-03-29 (87 y.o. M) Treating RN: Yevonne PaxEpps, Carrie Primary Care Walther Sanagustin: Marcelino DusterJohnston, John Other Clinician: Lolita CramBurnette, Kyara Referring Gwendola Hornaday: Marcelino DusterJohnston, John Treating Staisha Winiarski/Extender: Rowan BlaseStone, Hoyt Weeks in Treatment: 18 Active Problems Location of Pain Severity and Description of Pain Patient Has Paino No Site Locations Rate the pain. Current Pain Level: 0 Pain Management and Medication Current Pain Management: Electronic Signature(s) Signed: 02/26/2021 4:17:01 PM By: Lolita CramBurnette, Kyara Signed: 03/02/2021 8:10:37 AM By: Yevonne PaxEpps, Carrie RN Entered By: Lolita CramBurnette, Kyara on 02/26/2021 13:06:05 [name] HiltsSCHENK, [name] ([id number]) -------------------------------------------------------------------------------- Patient/Caregiver Education  Details Patient Name: [name] HiltsSCHENK, [name] Date of Service: 02/26/2021 12:30 PM Medical Record Number: [id number] Patient Account Number: [id number] Date of Birth/Gender: 1931-03-29 (87 y.o. M) Treating RN: Yevonne PaxEpps, Carrie Primary Care Physician: Marcelino DusterJohnston, John Other Clinician: Lolita CramBurnette, Kyara Referring Physician: Marcelino DusterJohnston, John Treating Physician/Extender: Rowan BlaseStone, Hoyt Weeks in Treatment: 18 Education Assessment Education Provided To: Patient Education Topics Provided Wound/Skin Impairment: Methods: Printed Responses: State content correctly [employer]-F[employer] CompanyElectronic Signature(s) Signed: 03/02/2021 8:10:37 AM By: Yevonne PaxEpps, Carrie RN Entered By: Yevonne PaxEpps, Carrie on 02/26/2021 13:31:37 [name] HiltsSCHENK, [name] ([id number]) -------------------------------------------------------------------------------- Wound Assessment Details Patient Name: [name] HiltsSCHENK, [name] Date of Service: 02/26/2021 12:30 PM Medical Record Number: [id number] Patient Account Number: [id number] Date of Birth/Sex: 1931-03-29 (87 y.o. M) Treating RN: Yevonne PaxEpps, Carrie Primary Care Gauge Winski: Marcelino DusterJohnston, John Other Clinician: Lolita CramBurnette, Kyara Referring Marshawn Normoyle: Marcelino DusterJohnston, John Treating Dontravious Camille/Extender: [name], Hoyt Weeks in Treatment: 18 Wound Status Wound Number: 12 Primary Pressure Ulcer Etiology: Wound Location: Sacrum Wound Open Wounding Event: Pressure Injury Status: Date Acquired: 01/18/2021 Comorbid Cataracts, Sleep Apnea, Congestive Heart Failure, Weeks Of Treatment: 4 History: Coronary Artery Disease, Hypertension, Osteoarthritis Clustered Wound: No Photos Wound Measurements Length: (cm) 0.5  Width: (cm) 0.3 Depth: (cm) 0.1 Area: (cm) 0.118 Volume: (cm) 0.012 % Reduction in Area: 64.2% % Reduction in Volume: 87.9% Epithelialization: None Tunneling: No Undermining: No Wound Description Classification: Category/Stage II Wound Margin: Flat and Intact Exudate Amount: Small Exudate Type: Serous Exudate Color: amber Foul Odor After  Cleansing: No Slough/Fibrino Yes Wound Bed Granulation Amount: Large (67-100%) Exposed Structure Granulation Quality: Red, Pink Fascia Exposed: No Necrotic Amount: Small (1-33%) Fat Layer (Subcutaneous Tissue) Exposed: Yes Necrotic Quality: Adherent Slough Tendon Exposed: No Muscle Exposed: No Joint Exposed: No Bone Exposed: No Treatment Notes Wound #12 (Sacrum) Cleanser Normal Saline Discharge Instruction: Wash your hands with soap and water. Remove old dressing, discard into plastic bag and place into trash. Cleanse the wound with Normal Saline prior to applying a clean dressing using gauze sponges, not tissues or cotton balls. Do not scrub or use excessive force. Pat dry using gauze sponges, not tissue or cotton balls. [name], NEHRING A. ([id number]) Peri-Wound Care Topical Primary Dressing Silvercel Small 2x2 (in/in) Discharge Instruction: Apply Silvercel Small 2x2 (in/in) as instructed Secondary Dressing Mepilex Border Flex, 4x4 (in/in) Discharge Instruction: Apply to wound as directed. Do not cut. Secured With Compression Wrap Compression Stockings Add-Ons Electronic Signature(s) Signed: 02/26/2021 4:17:01 PM By: Lolita Cram Signed: 03/02/2021 8:10:37 AM By: Yevonne Pax RN Entered By: Lolita Cram on 02/26/2021 13:11:43 [name] A. ([id number]) -------------------------------------------------------------------------------- Vitals Details Patient Name: [name] A. Date of Service: 02/26/2021 12:30 PM Medical Record Number: [id number] Patient Account Number: [id number] Date of Birth/Sex: [birth year] (87 y.o. M) Treating RN: Yevonne Pax Primary Care Finn Altemose: [name] Other Clinician: Lolita Cram Referring Prem Coykendall: [name] Treating Hilja Kintzel/Extender: [name] Derry Weeks in Treatment: 18 Vital Signs Time Taken: 01:00 Temperature (F): 97.6 Pulse (bpm): 70 Respiratory Rate (breaths/min): 18 Blood Pressure (mmHg): 112/57 Reference  Range: 80 - 120 mg / dl Electronic Signature(s) Signed: 02/26/2021 4:17:01 PM By: Lolita Cram Entered By: Lolita Cram on 02/26/2021 13:05:58

## 2021-03-06 ENCOUNTER — Other Ambulatory Visit
Admission: RE | Admit: 2021-03-06 | Discharge: 2021-03-06 | Disposition: A | Discharge status: Home / Self Care | Payer: Medicare Other | Source: Ambulatory Visit | Attending: Internal Medicine | Admitting: Internal Medicine

## 2021-03-06 DIAGNOSIS — I4891 Unspecified atrial fibrillation: Secondary | ICD-10-CM | POA: Diagnosis present

## 2021-03-06 LAB — PROTIME-INR
INR: 2.4 — ABNORMAL HIGH (ref 0.8–1.2)
Prothrombin Time: 25.6 seconds — ABNORMAL HIGH (ref 11.4–15.2)

## 2021-03-13 ENCOUNTER — Other Ambulatory Visit
Admission: RE | Admit: 2021-03-13 | Discharge: 2021-03-13 | Disposition: A | Discharge status: Home / Self Care | Payer: Medicare Other | Source: Ambulatory Visit | Attending: Internal Medicine | Admitting: Internal Medicine

## 2021-03-13 DIAGNOSIS — I4891 Unspecified atrial fibrillation: Secondary | ICD-10-CM | POA: Insufficient documentation

## 2021-03-13 LAB — PROTIME-INR
INR: 2.9 — ABNORMAL HIGH (ref 0.8–1.2)
Prothrombin Time: 30.6 seconds — ABNORMAL HIGH (ref 11.4–15.2)

## 2021-03-16 ENCOUNTER — Encounter: Payer: Medicare Other | Admitting: Physician Assistant

## 2021-03-16 ENCOUNTER — Other Ambulatory Visit: Payer: Self-pay

## 2021-03-16 DIAGNOSIS — L97812 Non-pressure chronic ulcer of other part of right lower leg with fat layer exposed: Secondary | ICD-10-CM | POA: Diagnosis not present

## 2021-03-16 NOTE — Progress Notes (Addendum)
[name], [name] ([id number]) Visit Report for 03/16/2021 Arrival Information Details Patient Name: [name], [name] Date of Service: 03/16/2021 8:15 AM Medical Record Number: [id number] Patient Account Number: [id number] Date of Birth/Sex: [birth year] (87 y.o. M) Treating RN: Donnamarie Poag Primary Care Sayyid Harewood: Harrel Lemon Other Clinician: Jeanine Luz Referring Crosley Stejskal: Harrel Lemon Treating Tyrone Pautsch/Extender: Skipper Cliche in Treatment: 21 Visit Information History Since Last Visit Added or deleted any medications: No Patient Arrived: Wheel Chair Had a fall or experienced change in No Arrival Time: 08:50 activities of daily living that may affect Accompanied By: daughter risk of falls: Transfer Assistance: Manual Hospitalized since last visit: No Patient Identification Verified: Yes Has Dressing in Place as Prescribed: Yes Secondary Verification Process Completed: Yes Pain Present Now: Yes Patient Requires Transmission-Based Precautions: No Patient Has Alerts: No Electronic Signature(s) Signed: 03/16/2021 4:01:56 PM By: Donnamarie Poag Entered By: Donnamarie Poag on 03/16/2021 08:53:23 [place], [place]. ([id number]) -------------------------------------------------------------------------------- Clinic Level of Care Assessment Details Patient Name: [name] Bridge A. Date of Service: 03/16/2021 8:15 AM Medical Record Number: [id number] Patient Account Number: [id number] Date of Birth/Sex: 04-Apr-1931 (87 y.o. M) Treating RN: Carlene Coria Primary Care Scheryl Sanborn: Harrel Lemon Other Clinician: Referring Finley Dinkel: Harrel Lemon Treating Breylon Sherrow/Extender: Skipper Cliche in Treatment: 21 Clinic Level of Care Assessment Items TOOL 4 Quantity Score X - Use when only an EandM is performed on FOLLOW-UP visit 1 0 ASSESSMENTS - Nursing Assessment / Reassessment X - Reassessment of Co-morbidities (includes updates in patient status) 1 10 X- 1 5 Reassessment of Adherence to  Treatment Plan ASSESSMENTS - Wound and Skin Assessment / Reassessment X - Simple Wound Assessment / Reassessment - one wound 1 5 _0  - 0 Complex Wound Assessment / Reassessment - multiple wounds _1  - 0 Dermatologic / Skin Assessment (not related to wound area) ASSESSMENTS - Focused Assessment _2  - Circumferential Edema Measurements - multi extremities 0 _3  - 0 Nutritional Assessment / Counseling / Intervention _4  - 0 Lower Extremity Assessment (monofilament, tuning fork, pulses) _5  - 0 Peripheral Arterial Disease Assessment (using hand held doppler) ASSESSMENTS - Ostomy and/or Continence Assessment and Care _6  - Incontinence Assessment and Management 0 _7  - 0 Ostomy Care Assessment and Management (repouching, etc.) PROCESS - Coordination of Care X - Simple Patient / Family Education for ongoing care 1 15 _8  - 0 Complex (extensive) Patient / Family Education for ongoing care _9  - 0 Staff obtains Programmer, systems, Records, Test Results / Process Orders _10  - 0 Staff telephones HHA, Nursing Homes / Clarify orders / etc _11  - 0 Routine Transfer to another Facility (non-emergent condition) _12  - 0 Routine Hospital Admission (non-emergent condition) _13  - 0 New Admissions / Biomedical engineer / Ordering NPWT, Apligraf, etc. _14  - 0 Emergency Hospital Admission (emergent condition) X- 1 10 Simple Discharge Coordination _15  - 0 Complex (extensive) Discharge Coordination PROCESS - Special Needs _16  - Pediatric / Minor Patient Management 0 _17  - 0 Isolation Patient Management _18  - 0 Hearing / Language / Visual special needs _19  - 0 Assessment of Community assistance (transportation, D/C planning, etc.) _20  - 0 Additional assistance / Altered mentation _21  - 0 Support Surface(s) Assessment (bed, cushion, seat, etc.) INTERVENTIONS - Wound Cleansing / Measurement [name], [name] ([id number]) X- 1 5 Simple Wound Cleansing - one wound _22  - 0 Complex Wound Cleansing - multiple wounds X- 1  5 Wound Imaging (photographs - any number of wounds) _23  - 0 Wound Tracing (instead of photographs) X- 1 5 Simple Wound Measurement - one wound _24  -  0 Complex Wound Measurement - multiple wounds INTERVENTIONS - Wound Dressings X - Small Wound Dressing one or multiple wounds 1 10 _0  - 0 Medium Wound Dressing one or multiple wounds _1  - 0 Large Wound Dressing one or multiple wounds X- 1 5 Application of Medications - topical <FIEPPIRJJOACZYSA>_6<\/TKZSWFUXNATFTDDU>_2  - 0 Application of Medications - injection INTERVENTIONS - Miscellaneous _3  - External ear exam 0 _4  - 0 Specimen Collection (cultures, biopsies, blood, body fluids, etc.) _5  - 0 Specimen(s) / Culture(s) sent or taken to Lab for analysis _6  - 0 Patient Transfer (multiple staff / Civil Service fast streamer / Similar devices) _7  - 0 Simple Staple / Suture removal (25 or less) _8  - 0 Complex Staple / Suture removal (26 or more) _9  - 0 Hypo / Hyperglycemic Management (close monitor of Blood Glucose) _10  - 0 Ankle / Brachial Index (ABI) - do not check if billed separately X- 1 5 Vital Signs Has the patient been seen at the hospital within the last three years: Yes Total Score: 80 Level Of Care: New/Established - Level 3 Electronic Signature(s) Signed: 03/21/2021 3:51:10 PM By: Carlene Coria RN Entered By: Carlene Coria on 03/16/2021 09:18:33 [name] Bridge A. ([id number]) -------------------------------------------------------------------------------- Encounter Discharge Information Details Patient Name: [name] Bridge A. Date of Service: 03/16/2021 8:15 AM Medical Record Number: [id number] Patient Account Number: [id number] Date of Birth/Sex: 05/01/1931 (87 y.o. M) Treating RN: Carlene Coria Primary Care Tully Burgo: Harrel Lemon Other Clinician: Referring Analia Zuk: Harrel Lemon Treating Johndavid Geralds/Extender: Skipper Cliche in Treatment: 21 Encounter Discharge Information Items Discharge Condition: Stable Ambulatory Status: Wheelchair Discharge Destination:  Home Transportation: Private Auto Accompanied By: daughter Schedule Follow-up Appointment: Yes Clinical Summary of Care: Electronic Signature(s) Signed: 03/21/2021 3:51:10 PM By: Carlene Coria RN Entered By: Carlene Coria on 03/16/2021 09:19:40 [place], [place] ([id number]) -------------------------------------------------------------------------------- Lower Extremity Assessment Details Patient Name: [name] Bridge A. Date of Service: 03/16/2021 8:15 AM Medical Record Number: [id number] Patient Account Number: [id number] Date of Birth/Sex: 12/05/1930 (87 y.o. M) Treating RN: Donnamarie Poag Primary Care Yosmar Ryker: Harrel Lemon Other Clinician: Referring Briea Mcenery: Harrel Lemon Treating Delphina Schum/Extender: Jeri Cos Weeks in Treatment: 21 Electronic Signature(s) Signed: 03/16/2021 4:01:56 PM By: Donnamarie Poag Entered By: Donnamarie Poag on 03/16/2021 09:00:37 [name] Bridge A. ([id number]) -------------------------------------------------------------------------------- Multi Wound Chart Details Patient Name: [name] Bridge A. Date of Service: 03/16/2021 8:15 AM Medical Record Number: [id number] Patient Account Number: [id number] Date of Birth/Sex: 1931/08/13 (87 y.o. M) Treating RN: Carlene Coria Primary Care Coen Miyasato: Harrel Lemon Other Clinician: Referring Evadna Donaghy: Harrel Lemon Treating Sachi Boulay/Extender: Skipper Cliche in Treatment: 21 Vital Signs Height(in): Pulse(bpm): 45 Weight(lbs): Blood Pressure(mmHg): 155/77 Body Mass Index(BMI): Temperature(F): 98.1 Respiratory Rate(breaths/min): 18 Photos: [N/A:N/A] Wound Location: Sacrum N/A N/A Wounding Event: Pressure Injury N/A N/A Primary Etiology: Pressure Ulcer N/A N/A Comorbid History: Cataracts, Sleep Apnea, Congestive N/A N/A Heart Failure, Coronary Artery Disease, Hypertension, Osteoarthritis Date Acquired: 01/18/2021 N/A N/A Weeks of Treatment: 6 N/A N/A Wound Status: Open N/A N/A Measurements L x W x D (cm)  0.2x0.1x0.1 N/A N/A Area (cm) : 0.016 N/A N/A Volume (cm) : 0.002 N/A N/A % Reduction in Area: 95.20% N/A N/A % Reduction in Volume: 98.00% N/A N/A Classification: Category/Stage II N/A N/A Exudate Amount: Small N/A N/A Exudate Type: Serous N/A N/A Exudate Color: amber N/A N/A Wound Margin: Flat and Intact N/A N/A Granulation Amount: Large (67-100%) N/A N/A Granulation Quality: Red, Pink N/A N/A Necrotic Amount: Small (1-33%) N/A N/A Exposed Structures: Fat Layer (Subcutaneous Tissue): N/A N/A Yes Fascia: No Tendon: No Muscle: No Joint: No  Bone: No Epithelialization: None N/A N/A Treatment Notes Electronic Signature(s) Signed: 03/21/2021 3:51:10 PM By: Carlene Coria RN Entered By: Carlene Coria on 03/16/2021 09:14:35 [name] Bridge A. ([id number]) -------------------------------------------------------------------------------- [place] Details Patient Name: [name] Bridge A. Date of Service: 03/16/2021 8:15 AM Medical Record Number: [id number] Patient Account Number: [id number] Date of Birth/Sex: Mar 10, 1931 (87 y.o. M) Treating RN: Carlene Coria Primary Care Karmen Altamirano: Harrel Lemon Other Clinician: Referring Soriyah Osberg: Harrel Lemon Treating Attikus Bartoszek/Extender: Skipper Cliche in Treatment: 21 Active Inactive Wound/Skin Impairment Nursing Diagnoses: Impaired tissue integrity Goals: Patient/caregiver will verbalize understanding of skin care regimen Date Initiated: 10/17/2020 Target Resolution Date: 03/28/2021 Goal Status: Active Ulcer/skin breakdown will have a volume reduction of 30% by week 4 Date Initiated: 10/17/2020 Date Inactivated: 01/01/2021 Target Resolution Date: 12/18/2019 Goal Status: Unmet Unmet Reason: comorbities Ulcer/skin breakdown will have a volume reduction of 50% by week 8 Date Initiated: 10/17/2020 Date Inactivated: 01/29/2021 Target Resolution Date: 01/18/2020 Goal Status: Unmet Unmet Reason: comorbities Ulcer/skin breakdown  will have a volume reduction of 80% by week 12 Date Initiated: 10/17/2020 Date Inactivated: 03/16/2021 Target Resolution Date: 02/15/2020 Goal Status: Met Ulcer/skin breakdown will heal within 14 weeks Date Initiated: 10/17/2020 Target Resolution Date: 03/17/2021 Goal Status: Active Interventions: Assess patient/caregiver ability to obtain necessary supplies Assess ulceration(s) every visit Provide education on ulcer and skin care Treatment Activities: Skin care regimen initiated : 10/17/2020 Notes: Electronic Signature(s) Signed: 03/21/2021 3:51:10 PM By: Carlene Coria RN Entered By: Carlene Coria on 03/16/2021 09:14:26 [place], [place]. ([id number]) -------------------------------------------------------------------------------- Pain Assessment Details Patient Name: [name], [name] Date of Service: 03/16/2021 8:15 AM Medical Record Number: [id number] Patient Account Number: [id number] Date of Birth/Sex: Nov 01, 1931 (87 y.o. M) Treating RN: Donnamarie Poag Primary Care Zofia Peckinpaugh: Harrel Lemon Other Clinician: Jeanine Luz Referring Marques Ericson: Harrel Lemon Treating Chloey Ricard/Extender: Skipper Cliche in Treatment: 21 Active Problems Location of Pain Severity and Description of Pain Patient Has Paino Yes Site Locations Rate the pain. Current Pain Level: 5 Pain Management and Medication Current Pain Management: Electronic Signature(s) Signed: 03/16/2021 4:01:56 PM By: Donnamarie Poag Entered By: Donnamarie Poag on 03/16/2021 08:53:50 [name] Bridge A. ([id number]) -------------------------------------------------------------------------------- Patient/Caregiver Education Details Patient Name: [name] Bridge A. Date of Service: 03/16/2021 8:15 AM Medical Record Number: [id number] Patient Account Number: [id number] Date of Birth/Gender: [birth year] (87 y.o. M) Treating RN: Carlene Coria Primary Care Physician: Harrel Lemon Other Clinician: Referring Physician: Harrel Lemon Treating  Physician/Extender: Skipper Cliche in Treatment: 21 Education Assessment Education Provided To: Patient Education Topics Provided Wound/Skin Impairment: Methods: Explain/Verbal Responses: State content correctly Electronic Signature(s) Signed: 03/21/2021 3:51:10 PM By: Carlene Coria RN Entered By: Carlene Coria on 03/16/2021 09:18:52 [place], [place]. ([id number]) -------------------------------------------------------------------------------- Wound Assessment Details Patient Name: [name] Bridge A. Date of Service: 03/16/2021 8:15 AM Medical Record Number: [id number] Patient Account Number: [id number] Date of Birth/Sex: September 28, 1931 (87 y.o. M) Treating RN: Carlene Coria Primary Care Yaniris Braddock: Harrel Lemon Other Clinician: Referring Aris Even: Harrel Lemon Treating Anamae Rochelle/Extender: Jeri Cos Weeks in Treatment: 21 Wound Status Wound Number: 12 Primary Pressure Ulcer Etiology: Wound Location: Sacrum Wound Open Wounding Event: Pressure Injury Status: Date Acquired: 01/18/2021 Comorbid Cataracts, Sleep Apnea, Congestive Heart Failure, Weeks Of Treatment: 6 History: Coronary Artery Disease, Hypertension, Osteoarthritis Clustered Wound: No Photos Wound Measurements Length: (cm) 0.2 Width: (cm) 0.1 Depth: (cm) 0.1 Area: (cm) 0.016 Volume: (cm) 0.002 % Reduction in Area: 95.2% % Reduction in Volume: 98% Epithelialization: None Tunneling: No Undermining: No Wound Description Classification: Category/Stage II Wound Margin: Flat and Intact Exudate Amount: Small Exudate Type:  Serous Exudate Color: amber Foul Odor After Cleansing: No Slough/Fibrino Yes Wound Bed Granulation Amount: Large (67-100%) Exposed Structure Granulation Quality: Red, Pink Fascia Exposed: No Necrotic Amount: Small (1-33%) Fat Layer (Subcutaneous Tissue) Exposed: Yes Tendon Exposed: No Muscle Exposed: No Joint Exposed: No Bone Exposed: No Treatment Notes Wound #12  (Sacrum) Cleanser Normal Saline Discharge Instruction: Wash your hands with soap and water. Remove old dressing, discard into plastic bag and place into trash. Cleanse the wound with Normal Saline prior to applying a clean dressing using gauze sponges, not tissues or cotton balls. Do not scrub or use excessive force. Pat dry using gauze sponges, not tissue or cotton balls. [place], [place] ([id number]) Peri-Wound Care Topical Primary Dressing Silvercel Small 2x2 (in/in) Discharge Instruction: Apply Silvercel Small 2x2 (in/in) as instructed Secondary Dressing Mepilex Border Flex, 4x4 (in/in) Discharge Instruction: Apply to wound as directed. Do not cut. Secured With Compression Wrap Compression Stockings Environmental education officer) Signed: 03/21/2021 3:51:10 PM By: Carlene Coria RN Entered By: Carlene Coria on 03/16/2021 09:12:57 [name] Bridge A. ([id number]) -------------------------------------------------------------------------------- Vitals Details Patient Name: [name] Bridge A. Date of Service: 03/16/2021 8:15 AM Medical Record Number: [id number] Patient Account Number: [id number] Date of Birth/Sex: 01-Dec-1930 (87 y.o. M) Treating RN: Donnamarie Poag Primary Care Keshawna Dix: Harrel Lemon Other Clinician: Jeanine Luz Referring Suda Forbess: Harrel Lemon Treating Carmella Kees/Extender: Jeri Cos Weeks in Treatment: 21 Vital Signs Time Taken: 08:50 Temperature (F): 98.1 Pulse (bpm): 73 Respiratory Rate (breaths/min): 18 Blood Pressure (mmHg): 155/77 Reference Range: 80 - 120 mg / dl Electronic Signature(s) Signed: 03/16/2021 4:01:56 PM By: Donnamarie Poag Entered ByDonnamarie Poag on 03/16/2021 08:53:41

## 2021-03-16 NOTE — Progress Notes (Addendum)
AMARII, BORDAS ([id number]) Visit Report for 03/16/2021 Chief Complaint Document Details Patient Name: [name], [name] Date of Service: 03/16/2021 8:15 AM Medical Record Number: [id number] Patient Account Number: [id number] Date of Birth/Sex: [name] (87 y.o. M) Treating RN: Yevonne Pax Primary Care Provider: [name] Other Clinician: Lolita Cram Referring Provider: [name] Treating Provider/Extender: Rowan Blase in Treatment: 21 Information Obtained from: Patient Chief Complaint Left LE Ulcer and Sacral Ulcer Electronic Signature(s) Signed: 03/16/2021 8:39:50 AM By: Lenda Kelp PA-C Entered By: Lenda Kelp on 03/16/2021 08:39:50 [name] Hilts A. ([id number]) -------------------------------------------------------------------------------- HPI Details Patient Name: [name] Hilts A. Date of Service: 03/16/2021 8:15 AM Medical Record Number: [id number] Patient Account Number: [id number] Date of Birth/Sex: [name], [name] (87 y.o. M) Treating RN: Yevonne Pax Primary Care Provider: [name] Other Clinician: Lolita Cram Referring Provider: [name] Treating Provider/Extender: Rowan Blase in Treatment: 21 History of Present Illness HPI Description: 04/21/2019 ADMISSION This is an independent 87 year old man who lives in the independent part of [address] of [place]. He has a history of chronic lower extremity edema and wears compression stockings. He states about a month ago he was taking off the one on the right and pulled some skin off accidentally. He has had 2 small wounds on the right anterior and right medial lower extremity. I note looking through [facility] health link that he had multiple venous ultrasounds in 2015 and 16. These were DVT rule outs. He did have a Baker's cyst on the left. He has not not had a previous wound history although he did have a history of cellulitis in his legs. Past medical history; includes aortic  stenosis status post mechanical AVR in 2007 on chronic Coumadin, lower extremity edema with a history of cellulitis, history of MRSA. ABIs in our clinic were 1.1 on the right 6/3; patient with predominantly venous insufficiency ulcers in the right lower leg probably some degree of lymphedema. He had to wounds last week. We put him in 3 layer compression. The nurses at [facility] are changing the dressing. The area laterally is healed but he still has a small very painful area on the anterior tibial area. He is on Coumadin because of mechanical aortic valve. 6/10; venous insufficiency ulcers on the right lower leg. He has some degree of lymphedema. We have been using 3 layer compression with silver collagen small wound. Dimensions are improved. I gave him doxycycline last week which he tolerated because of surrounding erythema. This is improved also 6/17; arrives in clinic today with portable silver collagen dressing tightly adherent to the wound. Also felt that the wrap was too tight 3 layer being changed at the [facility] 6/24; patient's wound is small still adherent debris over the surface however even under illumination it was hard to see what was still open here. We have been using silver collagen 7/1; the patient arrives in the clinic today and the area on the right lower leg is healed. He has severe chronic venous insufficiency. He has 20/30 compression stockings. Readmission: 06/22/2020 on evaluation today patient presents for reevaluation here in our clinic although it has been a little over a year since we last saw him. He does have a history of lymphedema, venous insufficiency, anticoagulant therapy, he is on a pacemaker which is the reason for the anticoagulants, hypertension, and congestive heart failure. With that being said unfortunately he has been dealing with a wound of the left and right legs which has been giving him some trouble since  arrival first 2021. That  is in regard to the left leg ulcer. In regard to the right leg ulcer this is just a very small area that really I think will seal up quite nicely is more of the lymphedema opening than anything else. With that being said unfortunately the left leg is quite significant. We actually have noted that the patient's been dealing with this for quite some time and I think that there is a chance we may want to consider biopsy. With that being said he is on Coumadin therefore we were not able to perform a biopsy at this point. I think that something however in the future that may be warranted we may just have to take him off of the current Coumadin regimen in order to do this. We discussed doing it today but I am more concerned about the fact that he could have issues with uncontrolled bleeding and in that case we may have to send him to the ER. His daughter was not wanting to take that chance which I can completely understand. Nonetheless so far they have been using antibiotic ointment over the area I am just putting a protective dressing I do believe the patient is that he needs some type of compression. 07/07/2020 on evaluation today patient appears to be doing poorly in regard to his bilateral lower extremities today. He actually went to urgent care last night due to having pain in his legs due to the wraps. It appears they got somewhat tight on him because of the fact that he is having such significant swelling. He appears to be fluid overloaded I do not see any signs of infection actively at this point which is good news but unfortunately I think that this is something that may need to be addressed even by cardiology more so than just with a different or alternate type of wrap. I will contact his cardiologist, Dr. Mariah Milling, as well. 07/10/2020 upon evaluation today patient unfortunately appears to be doing worse even than when I saw him on Friday from the standpoint of where his legs stand. The main issue is  that he has increased erythema although there is still not warm to touch I am concerned about infection and about this getting significantly worse. Again my initial concern was more fluid overload and I discussed this with his cardiology office on Friday. With that being said I feel like this has gotten worse his daughter is in agreement and I think that the best option would be to send him to the hospital at this point. READMISSION 09/06/2020 Is a patient who has had 2 stays in this clinic firstly in 2020 and more recently from 06/02/2020 through 07/10/2020. He was cared for by Allen Derry. When he was last here he had bilateral lower extremity ulcers in the setting of chronic venous insufficiency with lymphedema. When he we last saw him he had weeping edema fluido Cellulitis and he was sent to the hospital. He was admitted initially from 06/30/2020 through 07/14/2020 with cellulitis plus stasis dermatitis. Treated empirically with vancomycin and had increases in his torsemide. He was then sent to the skilled level of [facility] but readmitted to hospital from 07/29/2020 through 08/10/2020 with blood loss anemia hemoglobin of 4 and OB positive stools. During this hospitalization he was noted to have a sacral wound at stage II. He was sent to [facility] again in the skilled level. He had compression on his legs with Coban. He is now back  at his independent living setting. He has not been wearing compression and his daughter feels there is already increasing swelling. To have stockings at home not exactly sure at what strength. TREVEN, HOLTMAN A. ([id number]) Past medical history includes lymphedema, chronic venous insufficiency, pacemaker, congestive heart failure, hypertension, aortic valve stenosis status post mechanical valve replacement in 2002 on chronic Coumadin, chronic kidney disease stage IIIb, atrial fibrillation, peripheral neuropathy, large hiatal hernia. We did not test his  ABIs in the clinic today. Readmission: 10/17/2020 on evaluation today patient appears to be doing somewhat poorly in regard to his left leg and the fact that he does have an open wound at this point. He has not had the compression socks placed at the facility they have not been putting them on at all unfortunately. He does not have on the right leg currently and apparently there is significant put him on the left leg with the wound which I understand is that can be somewhat tight obviously. Nonetheless I do believe the right leg needs to have a compression sock and I do believe on the left side right now we can do something a little better in order to try to get the wound healed at this point. The patient is on torsemide and currently allopurinol for gout. He also has daily weights due to congestive heart failure I am assuming in order to monitor his fluid status overall. He is currently a DNR at the facility. 10/30/2020 upon evaluation today patient appears to be doing somewhat poorly at this time in regard to his bilateral lower extremities. His left leg which is the only place where he had a wound last week when I saw him is actually doing worse with new wound openings. The right leg also has a small wound as well. However the most concerning thing is that last week he did not have any signs of infection or erythema there is no warmth to touch over the leg. This week the entire leg is more sore he also has erythema extending from the ankle to just below the knee with the area being erythematous and warm to touch which I believe represents cellulitis at minimum. I am concerned about how this is spread so quickly and the fact that he is having increased pain and also not feeling as well. All in all I discussed with the patient that unfortunately I feel like he may be best served going to the ER for further evaluation of this issue currently to try to see about the potential for IV antibiotics and to  have appropriate lab work performed. His daughter is present during the visit today and she was included in the decision making at this point. 11/13/2020 upon evaluation today patient appears to be doing well with regard to his leg ulcer. Fortunately he does not appear to be showing signs of infection anything like it was previous. This is great news. He was sent home with IV antibiotic therapy that seems to have done extremely well for him. No fevers, chills, nausea, vomiting, or diarrhea. 11/27/2020 upon evaluation today patient appears to be doing quite well in regard to his wounds currently. Fortunately there is no signs of active infection at this time which is great news and I am very pleased in this regard. He did have some concerns or his daughter did about an area on his gluteal region. With that being said I see nothing that is actually open at this point which is great news I do  think continuing with a derma cloud is a good way to go. With regard to his leg he does have a new area on the right leg of open wound locations which is new compared to last visit we will need to address this as well. Otherwise he seems to be doing quite well in my opinion. 12/15/2020 upon evaluation today patient appears to be doing well with regard to his leg ulcer on the left. With that being said he did not have the Tubigrip on which I think is integral to getting this closed as he continues to weep. Subsequently I think the Tubigrip will help control some of the edema which will help him in that regard. Fortunately there is no signs of active infection at this time. No fevers, chills, nausea, vomiting, or diarrhea. 01/01/2021 upon evaluation today patient appears to be doing well with regard to his wound. This is measuring better and actually is significantly smaller. Fortunately there is no signs of active infection at this time. No fevers, chills, nausea, vomiting, or diarrhea. 01/15/2021 on evaluation today  patient appears to be doing well with regard to his left leg ulcer though he still continues to weep quite a bit he did not have the Tubigrip on the day that he supposed to. Fortunately there is no signs of active infection at this time. No fevers, chills, nausea, vomiting, or diarrhea. 01/29/2021 upon evaluation today patient appears to be doing well with regard to his right leg which is completely healed. His left leg though not healed is just a very small open area that is weeping. With regard to his gluteal region this is actually new and in fact he has a wound that is really in the sacral area. This is stated to be a stage II that may be the case there is really no slough buildup I Ernie Hew leave it as such for the time being. We will keep a close eye on things however and see how it proceeds. 02/12/2021 upon evaluation today patient appears to be doing excellent in regard to his leg ulcers. In fact everything appears to be closed as best I can tell at this point. I think he is ready to transition into his compression stocking. With that being said his gluteal region still is open although it appears to be fairly clean I think the collagen is still a good thing to do here. We will see how things continue to improve over the next week or so. 02/26/2021 on evaluation today patient appears to be doing well with regard to his wound. He is tolerating the dressing changes without complication the wound is smaller though I feel like he is at the point where we need to try to do something to dry this up a little bit more I think switching from silver collagen to a silver alginate would be beneficial. 03/16/2021 upon evaluation today patient appears to be doing excellent in regard to his wound. He has been tolerating the dressing changes without complication. Fortunately there is no signs of active infection at this time. No fevers, chills, nausea, vomiting, or diarrhea. Electronic Signature(s) Signed: 03/16/2021  9:18:41 AM By: Lenda Kelp PA-C Entered By: Lenda Kelp on 03/16/2021 [name]:18:41 Joellyn Quails ([id number]) -------------------------------------------------------------------------------- Physical Exam Details Patient Name: [name] Hilts A. Date of Service: 03/16/2021 8:15 AM Medical Record Number: [id number] Patient Account Number: [id number] Date of Birth/Sex: [name] (87 y.o. M) Treating RN: Yevonne Pax Primary Care Provider: [name] Other Clinician: Referring Provider:  [name] Treating Provider/Extender: Allen Derry Weeks in Treatment: 21 Constitutional Well-nourished and well-hydrated in no acute distress. Respiratory normal breathing without difficulty. Psychiatric this patient is able to make decisions and demonstrates good insight into disease process. Alert and Oriented x 3. pleasant and cooperative. Notes Patient's wound is again doing excellent it is around 8.1 size in general I feel like this is minimal and almost completely closed. With that being said I do think it still open and needs to be monitored for just a little bit longer I think his Roho cushion is done a great job for him. Electronic Signature(s) Signed: 03/16/2021 9:19:02 AM By: Lenda Kelp PA-C Entered By: Lenda Kelp on 03/16/2021 [name]:19:02 Joellyn Quails ([id number]) -------------------------------------------------------------------------------- Physician Orders Details Patient Name: [name] Hilts A. Date of Service: 03/16/2021 8:15 AM Medical Record Number: [id number] Patient Account Number: [id number] Date of Birth/Sex: [name] (87 y.o. M) Treating RN: Yevonne Pax Primary Care Provider: [name] Other Clinician: Referring Provider: [name] Treating Provider/Extender: Rowan Blase in Treatment: 21 Verbal / Phone Orders: No Diagnosis Coding ICD-10 Coding Code Description I89.0 Lymphedema, not elsewhere classified I87.2 Venous insufficiency  (chronic) (peripheral) L97.822 Non-pressure chronic ulcer of other part of left lower leg with fat layer exposed L97.812 Non-pressure chronic ulcer of other part of right lower leg with fat layer exposed L89.152 Pressure ulcer of sacral region, stage 2 Z79.01 Long term (current) use of anticoagulants Z95.0 Presence of cardiac pacemaker I10 Essential (primary) hypertension I50.42 Chronic combined systolic (congestive) and diastolic (congestive) heart failure I25.10 Atherosclerotic heart disease of native coronary artery without angina pectoris Follow-up Appointments o Return Appointment in 2 weeks. Edema Control - Lymphedema / Segmental Compressive Device / Other Bilateral Lower Extremities o Patient to wear own compression stockings. Remove compression stockings every night before going to bed and put on every morning when getting up. - bilat lower leg o Elevate legs to the level of the heart and pump ankles as often as possible o Elevate leg(s) parallel to the floor when sitting. Off-Loading o Turn and reposition every 2 hours Wound Treatment Wound #12 - Sacrum Cleanser: Normal Saline (Generic) 3 x Per Week/30 Days Discharge Instructions: Wash your hands with soap and water. Remove old dressing, discard into plastic bag and place into trash. Cleanse the wound with Normal Saline prior to applying a clean dressing using gauze sponges, not tissues or cotton balls. Do not scrub or use excessive force. Pat dry using gauze sponges, not tissue or cotton balls. Primary Dressing: Silvercel Small 2x2 (in/in) (Generic) 3 x Per Week/30 Days Discharge Instructions: Apply Silvercel Small 2x2 (in/in) as instructed Secondary Dressing: Mepilex Border Flex, 4x4 (in/in) (Generic) 3 x Per Week/30 Days Discharge Instructions: Apply to wound as directed. Do not cut. Electronic Signature(s) Signed: 03/16/2021 1:[name]:49 PM By: Lenda Kelp PA-C Signed: 03/21/2021 3:51:10 PM By: Yevonne Pax  RN Entered By: Yevonne Pax on 03/16/2021 [name]:18:02 [name] Hilts A. ([id number]) -------------------------------------------------------------------------------- Problem List Details Patient Name: [name] Hilts A. Date of Service: 03/16/2021 8:15 AM Medical Record Number: [id number] Patient Account Number: [id number] Date of Birth/Sex: [name] (87 y.o. M) Treating RN: Yevonne Pax Primary Care Provider: [name] Other Clinician: Lolita Cram Referring Provider: [name] Treating Provider/Extender: Rowan Blase in Treatment: 21 Active Problems ICD-10 Encounter Code Description Active Date MDM Diagnosis I89.0 Lymphedema, not elsewhere classified 10/17/2020 No Yes I87.2 Venous insufficiency (chronic) (peripheral) 10/17/2020 No Yes L97.822 Non-pressure chronic ulcer of other part of left lower leg with fat layer  10/17/2020 No Yes exposed L97.812 Non-pressure chronic ulcer of other part of right lower leg with fat layer 10/17/2020 No Yes exposed L89.152 Pressure ulcer of sacral region, stage 2 01/29/2021 No Yes Z79.01 Long term (current) use of anticoagulants 10/17/2020 No Yes Z95.0 Presence of cardiac pacemaker 10/17/2020 No Yes I10 Essential (primary) hypertension 10/17/2020 No Yes I50.42 Chronic combined systolic (congestive) and diastolic (congestive) heart 10/17/2020 No Yes failure I25.10 Atherosclerotic heart disease of native coronary artery without angina 10/17/2020 No Yes pectoris Inactive Problems Resolved Problems Electronic Signature(s) Signed: 03/16/2021 8:39:03 AM By: Geni Bers, Vidal A. ([id number]) Entered By: Lenda Kelp on 03/16/2021 08:39:03 Braxton, Ina A. ([id number]) -------------------------------------------------------------------------------- Progress Note Details Patient Name: [name] Hilts A. Date of Service: 03/16/2021 8:15 AM Medical Record Number: [id number] Patient Account Number: [id number] Date of  Birth/Sex: [name] (87 y.o. M) Treating RN: Yevonne Pax Primary Care Provider: [name] Other Clinician: Referring Provider: [name] Treating Provider/Extender: Rowan Blase in Treatment: 21 Subjective Chief Complaint Information obtained from Patient Left LE Ulcer and Sacral Ulcer History of Present Illness (HPI) 04/21/2019 ADMISSION This is an independent 87 year old man who lives in the independent part of [address] [place]. He has a history of chronic lower extremity edema and wears compression stockings. He states about a month ago he was taking off the one on the right and pulled some skin off accidentally. He has had 2 small wounds on the right anterior and right medial lower extremity. I note looking through [facility] health link that he had multiple venous ultrasounds in 2015 and 16. These were DVT rule outs. He did have a Baker's cyst on the left. He has not not had a previous wound history although he did have a history of cellulitis in his legs. Past medical history; includes aortic stenosis status post mechanical AVR in 2007 on chronic Coumadin, lower extremity edema with a history of cellulitis, history of MRSA. ABIs in our clinic were 1.1 on the right 6/3; patient with predominantly venous insufficiency ulcers in the right lower leg probably some degree of lymphedema. He had to wounds last week. We put him in 3 layer compression. The nurses at [facility] are changing the dressing. The area laterally is healed but he still has a small very painful area on the anterior tibial area. He is on Coumadin because of mechanical aortic valve. 6/10; venous insufficiency ulcers on the right lower leg. He has some degree of lymphedema. We have been using 3 layer compression with silver collagen small wound. Dimensions are improved. I gave him doxycycline last week which he tolerated because of surrounding erythema. This is improved also 6/17; arrives in  clinic today with portable silver collagen dressing tightly adherent to the wound. Also felt that the wrap was too tight 3 layer being changed at the [facility] 6/24; patient's wound is small still adherent debris over the surface however even under illumination it was hard to see what was still open here. We have been using silver collagen 7/1; the patient arrives in the clinic today and the area on the right lower leg is healed. He has severe chronic venous insufficiency. He has 20/30 compression stockings. Readmission: 06/22/2020 on evaluation today patient presents for reevaluation here in our clinic although it has been a little over a year since we last saw him. He does have a history of lymphedema, venous insufficiency, anticoagulant therapy, he is on a pacemaker which is the reason for the anticoagulants, hypertension,  and congestive heart failure. With that being said unfortunately he has been dealing with a wound of the left and right legs which has been giving him some trouble since arrival first 2021. That is in regard to the left leg ulcer. In regard to the right leg ulcer this is just a very small area that really I think will seal up quite nicely is more of the lymphedema opening than anything else. With that being said unfortunately the left leg is quite significant. We actually have noted that the patient's been dealing with this for quite some time and I think that there is a chance we may want to consider biopsy. With that being said he is on Coumadin therefore we were not able to perform a biopsy at this point. I think that something however in the future that may be warranted we may just have to take him off of the current Coumadin regimen in order to do this. We discussed doing it today but I am more concerned about the fact that he could have issues with uncontrolled bleeding and in that case we may have to send him to the ER. His daughter was not wanting to take that  chance which I can completely understand. Nonetheless so far they have been using antibiotic ointment over the area I am just putting a protective dressing I do believe the patient is that he needs some type of compression. 07/07/2020 on evaluation today patient appears to be doing poorly in regard to his bilateral lower extremities today. He actually went to urgent care last night due to having pain in his legs due to the wraps. It appears they got somewhat tight on him because of the fact that he is having such significant swelling. He appears to be fluid overloaded I do not see any signs of infection actively at this point which is good news but unfortunately I think that this is something that may need to be addressed even by cardiology more so than just with a different or alternate type of wrap. I will contact his cardiologist, Dr. Mariah Milling, as well. 07/10/2020 upon evaluation today patient unfortunately appears to be doing worse even than when I saw him on Friday from the standpoint of where his legs stand. The main issue is that he has increased erythema although there is still not warm to touch I am concerned about infection and about this getting significantly worse. Again my initial concern was more fluid overload and I discussed this with his cardiology office on Friday. With that being said I feel like this has gotten worse his daughter is in agreement and I think that the best option would be to send him to the hospital at this point. READMISSION 09/06/2020 Is a patient who has had 2 stays in this clinic firstly in 2020 and more recently from 06/02/2020 through 07/10/2020. He was cared for by [facility]. When he was last here he had bilateral lower extremity ulcers in the setting of chronic venous insufficiency with lymphedema. When he we last saw him he had weeping edema fluido Cellulitis and he was sent to the hospital. He was admitted initially from 06/30/2020 through 07/14/2020 [facility],  Stanislaw A. ([id number]) with cellulitis plus stasis dermatitis. Treated empirically with vancomycin and had increases in his torsemide. He was then sent to the skilled level of [facility] but readmitted to hospital from 07/29/2020 through 08/10/2020 with blood loss anemia hemoglobin of 4 and OB positive stools. During this hospitalization he  was noted to have a sacral wound at stage II. He was sent to [facility] [place] again in the skilled level. He had compression on his legs with Coban. He is now back at his independent living setting. He has not been wearing compression and his daughter feels there is already increasing swelling. To have stockings at home not exactly sure at what strength. Past medical history includes lymphedema, chronic venous insufficiency, pacemaker, congestive heart failure, hypertension, aortic valve stenosis status post mechanical valve replacement in 2002 on chronic Coumadin, chronic kidney disease stage IIIb, atrial fibrillation, peripheral neuropathy, large hiatal hernia. We did not test his ABIs in the clinic today. Readmission: 10/17/2020 on evaluation today patient appears to be doing somewhat poorly in regard to his left leg and the fact that he does have an open wound at this point. He has not had the compression socks placed at the facility they have not been putting them on at all unfortunately. He does not have on the right leg currently and apparently there is significant put him on the left leg with the wound which I understand is that can be somewhat tight obviously. Nonetheless I do believe the right leg needs to have a compression sock and I do believe on the left side right now we can do something a little better in order to try to get the wound healed at this point. The patient is on torsemide and currently allopurinol for gout. He also has daily weights due to congestive heart failure I am assuming in order to monitor his fluid status overall. He  is currently a DNR at the facility. 10/30/2020 upon evaluation today patient appears to be doing somewhat poorly at this time in regard to his bilateral lower extremities. His left leg which is the only place where he had a wound last week when I saw him is actually doing worse with new wound openings. The right leg also has a small wound as well. However the most concerning thing is that last week he did not have any signs of infection or erythema there is no warmth to touch over the leg. This week the entire leg is more sore he also has erythema extending from the ankle to just below the knee with the area being erythematous and warm to touch which I believe represents cellulitis at minimum. I am concerned about how this is spread so quickly and the fact that he is having increased pain and also not feeling as well. All in all I discussed with the patient that unfortunately I feel like he may be best served going to the ER for further evaluation of this issue currently to try to see about the potential for IV antibiotics and to have appropriate lab work performed. His daughter is present during the visit today and she was included in the decision making at this point. 11/13/2020 upon evaluation today patient appears to be doing well with regard to his leg ulcer. Fortunately he does not appear to be showing signs of infection anything like it was previous. This is great news. He was sent home with IV antibiotic therapy that seems to have done extremely well for him. No fevers, chills, nausea, vomiting, or diarrhea. 11/27/2020 upon evaluation today patient appears to be doing quite well in regard to his wounds currently. Fortunately there is no signs of active infection at this time which is great news and I am very pleased in this regard. He did have some concerns or his  daughter did about an area on his gluteal region. With that being said I see nothing that is actually open at this point which is  great news I do think continuing with a derma cloud is a good way to go. With regard to his leg he does have a new area on the right leg of open wound locations which is new compared to last visit we will need to address this as well. Otherwise he seems to be doing quite well in my opinion. 12/15/2020 upon evaluation today patient appears to be doing well with regard to his leg ulcer on the left. With that being said he did not have the Tubigrip on which I think is integral to getting this closed as he continues to weep. Subsequently I think the Tubigrip will help control some of the edema which will help him in that regard. Fortunately there is no signs of active infection at this time. No fevers, chills, nausea, vomiting, or diarrhea. 01/01/2021 upon evaluation today patient appears to be doing well with regard to his wound. This is measuring better and actually is significantly smaller. Fortunately there is no signs of active infection at this time. No fevers, chills, nausea, vomiting, or diarrhea. 01/15/2021 on evaluation today patient appears to be doing well with regard to his left leg ulcer though he still continues to weep quite a bit he did not have the Tubigrip on the day that he supposed to. Fortunately there is no signs of active infection at this time. No fevers, chills, nausea, vomiting, or diarrhea. 01/29/2021 upon evaluation today patient appears to be doing well with regard to his right leg which is completely healed. His left leg though not healed is just a very small open area that is weeping. With regard to his gluteal region this is actually new and in fact he has a wound that is really in the sacral area. This is stated to be a stage II that may be the case there is really no slough buildup I Ernie HewMinna leave it as such for the time being. We will keep a close eye on things however and see how it proceeds. 02/12/2021 upon evaluation today patient appears to be doing excellent in regard to  his leg ulcers. In fact everything appears to be closed as best I can tell at this point. I think he is ready to transition into his compression stocking. With that being said his gluteal region still is open although it appears to be fairly clean I think the collagen is still a good thing to do here. We will see how things continue to improve over the next week or so. 02/26/2021 on evaluation today patient appears to be doing well with regard to his wound. He is tolerating the dressing changes without complication the wound is smaller though I feel like he is at the point where we need to try to do something to dry this up a little bit more I think switching from silver collagen to a silver alginate would be beneficial. 03/16/2021 upon evaluation today patient appears to be doing excellent in regard to his wound. He has been tolerating the dressing changes without complication. Fortunately there is no signs of active infection at this time. No fevers, chills, nausea, vomiting, or diarrhea. Mcquaig, Shankar A. ([id number]) Objective Constitutional Well-nourished and well-hydrated in no acute distress. Vitals Time Taken: 8:50 AM, Temperature: 98.1 F, Pulse: 73 bpm, Respiratory Rate: 18 breaths/min, Blood Pressure: 155/77 mmHg. Respiratory normal breathing without difficulty.  Psychiatric this patient is able to make decisions and demonstrates good insight into disease process. Alert and Oriented x 3. pleasant and cooperative. General Notes: Patient's wound is again doing excellent it is around 8.1 size in general I feel like this is minimal and almost completely closed. With that being said I do think it still open and needs to be monitored for just a little bit longer I think his Roho cushion is done a great job for him. Integumentary (Hair, Skin) Wound #12 status is Open. Original cause of wound was Pressure Injury. The date acquired was: 01/18/2021. The wound has been in treatment 6 weeks. The  wound is located on the Sacrum. The wound measures 0.2cm length x 0.1cm width x 0.1cm depth; 0.016cm^2 area and 0.002cm^3 volume. There is Fat Layer (Subcutaneous Tissue) exposed. There is no tunneling or undermining noted. There is a small amount of serous drainage noted. The wound margin is flat and intact. There is large (67-100%) red, pink granulation within the wound bed. There is a small (1-33%) amount of necrotic tissue within the wound bed. 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 Edema Control - Lymphedema / Segmental Compressive Device / Other: Patient to wear own compression stockings. Remove compression stockings every night before going to bed and put on every morning when getting up. - bilat lower leg Elevate legs to the level of the heart and pump ankles as often as possible Elevate leg(s) parallel to the floor when sitting. Off-Loading: Turn and reposition every 2 hours WOUND #12: - Sacrum Wound Laterality: Cleanser: Normal Saline (Generic) 3 x Per Week/30 Days Discharge Instructions: Wash your hands with soap and water. Remove old dressing, discard into plastic bag and place into trash. Cleanse the wound with Normal Saline prior to applying a clean dressing using gauze sponges, not tissues or cotton balls. Do not scrub or use excessive force. Pat dry using gauze sponges, not  tissue or cotton balls. Primary Dressing: Silvercel Small 2x2 (in/in) (Generic) 3 x Per Week/30 Days Discharge Instructions: Apply Silvercel Small 2x2 (in/in) as instructed Secondary Dressing: Mepilex Border Flex, 4x4 (in/in) (Generic) 3 x Per Week/30 Days Discharge Instructions: Apply to wound as directed. Do not cut. Keatley, Candice A. ([id number]) 1. I am good recommend currently that we going to continue with the wound care measures as before and the patient is in agreement with the plan. This includes the use of the silver alginate dressing to the wound bed but has not excellent. 2. Muscle can recommend a border foam dressing be continued which I think is also doing a great job for him. 3. I am also going to suggest he continue to use the Roho cushion for offloading I think that is doing an awesome job for him as well. We will see patient back for reevaluation in 2 weeks here in the clinic. If anything worsens or changes patient will contact our office for additional recommendations. Electronic Signature(s) Signed: 03/16/2021 9:19:38 AM By: Lenda Kelp PA-C Entered By: Lenda Kelp on 03/16/2021  [name]:19:38 ALBI, [place] AMarland Kitchen ([id number]) -------------------------------------------------------------------------------- SuperBill Details Patient Name: KASH, DAVIE A. Date of Service: 03/16/2021 Medical Record Number: [id number] Patient Account Number: [id number] Date of Birth/Sex: [birth year] (87 y.o. M) Treating RN: Yevonne Pax Primary Care Provider: [name] Other Clinician: Referring Provider: [name] Treating Provider/Extender: Rowan Blase in Treatment: 21 Diagnosis Coding ICD-10 Codes Code Description I89.0 Lymphedema, not elsewhere classified I87.2 Venous insufficiency (chronic) (peripheral) L97.822 Non-pressure chronic ulcer of other part of left lower leg with fat layer exposed L97.812 Non-pressure chronic ulcer of other part of right lower leg with fat  layer exposed L89.152 Pressure ulcer of sacral region, stage 2 Z79.01 Long term (current) use of anticoagulants Z95.0 Presence of cardiac pacemaker I10 Essential (primary) hypertension I50.42 Chronic combined systolic (congestive) and diastolic (congestive) heart failure I25.10 Atherosclerotic heart disease of native coronary artery without angina pectoris Facility Procedures CPT4 Code: [id number] Description: 99213 - WOUND CARE VISIT-LEV 3 EST PT Modifier: Quantity: 1 Physician Procedures CPT4 Code: [id number] Description: 99214 - WC PHYS LEVEL 4 - EST PT Modifier: Quantity: 1 CPT4 Code: Description: ICD-10 Diagnosis Description I89.0 Lymphedema, not elsewhere classified I87.2 Venous insufficiency (chronic) (peripheral) L97.822 Non-pressure chronic ulcer of other part of left lower leg with fat lay L97.812 Non-pressure chronic ulcer of  other part of right lower leg with fat la Modifier: er exposed yer exposed Quantity: Electronic Signature(s) Signed: 03/16/2021 9:19:51 AM By: Lenda Kelp PA-C Entered By: Lenda Kelp on 03/16/2021 [name]:19:51

## 2021-03-26 ENCOUNTER — Other Ambulatory Visit: Payer: Self-pay | Admitting: Internal Medicine

## 2021-03-26 DIAGNOSIS — R1310 Dysphagia, unspecified: Secondary | ICD-10-CM

## 2021-04-02 ENCOUNTER — Other Ambulatory Visit: Payer: Self-pay

## 2021-04-02 ENCOUNTER — Encounter: Payer: Medicare Other | Attending: Physician Assistant | Admitting: Physician Assistant

## 2021-04-02 DIAGNOSIS — I5042 Chronic combined systolic (congestive) and diastolic (congestive) heart failure: Secondary | ICD-10-CM | POA: Insufficient documentation

## 2021-04-02 DIAGNOSIS — N1832 Chronic kidney disease, stage 3b: Secondary | ICD-10-CM | POA: Insufficient documentation

## 2021-04-02 DIAGNOSIS — I872 Venous insufficiency (chronic) (peripheral): Secondary | ICD-10-CM | POA: Insufficient documentation

## 2021-04-02 DIAGNOSIS — M109 Gout, unspecified: Secondary | ICD-10-CM | POA: Insufficient documentation

## 2021-04-02 DIAGNOSIS — I130 Hypertensive heart and chronic kidney disease with heart failure and stage 1 through stage 4 chronic kidney disease, or unspecified chronic kidney disease: Secondary | ICD-10-CM | POA: Insufficient documentation

## 2021-04-02 DIAGNOSIS — Z950 Presence of cardiac pacemaker: Secondary | ICD-10-CM | POA: Insufficient documentation

## 2021-04-02 DIAGNOSIS — I890 Lymphedema, not elsewhere classified: Secondary | ICD-10-CM | POA: Insufficient documentation

## 2021-04-02 DIAGNOSIS — L89152 Pressure ulcer of sacral region, stage 2: Secondary | ICD-10-CM | POA: Insufficient documentation

## 2021-04-02 DIAGNOSIS — I2510 Atherosclerotic heart disease of native coronary artery without angina pectoris: Secondary | ICD-10-CM | POA: Insufficient documentation

## 2021-04-02 DIAGNOSIS — L97812 Non-pressure chronic ulcer of other part of right lower leg with fat layer exposed: Secondary | ICD-10-CM | POA: Insufficient documentation

## 2021-04-02 DIAGNOSIS — Z952 Presence of prosthetic heart valve: Secondary | ICD-10-CM | POA: Insufficient documentation

## 2021-04-02 DIAGNOSIS — Z7901 Long term (current) use of anticoagulants: Secondary | ICD-10-CM | POA: Insufficient documentation

## 2021-04-02 DIAGNOSIS — G629 Polyneuropathy, unspecified: Secondary | ICD-10-CM | POA: Insufficient documentation

## 2021-04-02 DIAGNOSIS — L97822 Non-pressure chronic ulcer of other part of left lower leg with fat layer exposed: Secondary | ICD-10-CM | POA: Insufficient documentation

## 2021-04-03 NOTE — Progress Notes (Signed)
[name], [name] ([id number]) Visit Report for 04/02/2021 Chief Complaint Document Details Patient Name: [name], [name]. Date of Service: 04/02/2021 9:30 AM Medical Record Number: [id number] Patient Account Number: [id number] Date of Birth/Sex: [birth year] (87 y.o. M) Treating RN: Hansel Feinstein Primary Care Provider: [name] Other Clinician: Referring Provider: [name] Treating Provider/Extender: Rowan Blase in Treatment: 23 Information Obtained from: Patient Chief Complaint Sacral and Left Gluteal Ulcers Electronic Signature(s) Signed: 04/02/2021 10:26:10 AM By: Lenda Kelp PA-C Entered By: Lenda Kelp on 04/02/2021 10:26:10 Joellyn Quails ([id number]) -------------------------------------------------------------------------------- HPI Details Patient Name: [name]. Date of Service: 04/02/2021 9:30 AM Medical Record Number: [id number] Patient Account Number: [id number] Date of Birth/Sex: [birth year] (87 y.o. M) Treating RN: Hansel Feinstein Primary Care Provider: [name] Other Clinician: Referring Provider: [name] Treating Provider/Extender: Rowan Blase in Treatment: 23 History of Present Illness HPI Description: 04/21/2019 ADMISSION This is an independent 87 year old man who lives in the independent part of [address] of [place]. He has [name] history of chronic lower extremity edema and wears compression stockings. He states about [name] month ago he was taking off the one on the right and pulled some skin off accidentally. He has had 2 small wounds on the right anterior and right medial lower extremity. I note looking through [facility] health link that he had multiple venous ultrasounds in 2015 and 16. These were DVT rule outs. He did have [name] Baker's cyst on the left. He has not not had [name] previous wound history although he did have [name] history of cellulitis in his legs. Past medical history; includes aortic stenosis status post mechanical AVR in 2007  on chronic Coumadin, lower extremity edema with [name] history of cellulitis, history of MRSA. ABIs in our clinic were 1.1 on the right 6/3; patient with predominantly venous insufficiency ulcers in the right lower leg probably some degree of lymphedema. He had to wounds last week. We put him in 3 layer compression. The nurses at [facility] are changing the dressing. The area laterally is healed but he still has [name] small very painful area on the anterior tibial area. He is on Coumadin because of mechanical aortic valve. 6/10; venous insufficiency ulcers on the right lower leg. He has some degree of lymphedema. We have been using 3 layer compression with silver collagen small wound. Dimensions are improved. I gave him doxycycline last week which he tolerated because of surrounding erythema. This is improved also 6/17; arrives in clinic today with portable silver collagen dressing tightly adherent to the wound. Also felt that the wrap was too tight 3 layer being changed at the [facility] 6/24; patient's wound is small still adherent debris over the surface however even under illumination it was hard to see what was still open here. We have been using silver collagen 7/1; the patient arrives in the clinic today and the area on the right lower leg is healed. He has severe chronic venous insufficiency. He has 20/30 compression stockings. Readmission: 06/22/2020 on evaluation today patient presents for reevaluation here in our clinic although it has been [name] little over [name] year since we last saw him. He does have [name] history of lymphedema, venous insufficiency, anticoagulant therapy, he is on [name] pacemaker which is the reason for the anticoagulants, hypertension, and congestive heart failure. With that being said unfortunately he has been dealing with [name] wound of the left and right legs which has been giving him some trouble since arrival first 2021. That is  in regard to the left leg ulcer. In  regard to the right leg ulcer this is just [name] very small area that really I think will seal up quite nicely is more of the lymphedema opening than anything else. With that being said unfortunately the left leg is quite significant. We actually have noted that the patient's been dealing with this for quite some time and I think that there is [name] chance we may want to consider biopsy. With that being said he is on Coumadin therefore we were not able to perform [name] biopsy at this point. I think that something however in the future that may be warranted we may just have to take him off of the current Coumadin regimen in order to do this. We discussed doing it today but I am more concerned about the fact that he could have issues with uncontrolled bleeding and in that case we may have to send him to the ER. His daughter was not wanting to take that chance which I can completely understand. Nonetheless so far they have been using antibiotic ointment over the area I am just putting [name] protective dressing I do believe the patient is that he needs some type of compression. 07/07/2020 on evaluation today patient appears to be doing poorly in regard to his bilateral lower extremities today. He actually went to urgent care last night due to having pain in his legs due to the wraps. It appears they got somewhat tight on him because of the fact that he is having such significant swelling. He appears to be fluid overloaded I do not see any signs of infection actively at this point which is good news but unfortunately I think that this is something that may need to be addressed even by cardiology more so than just with [name] different or alternate type of wrap. I will contact his cardiologist, Dr. Mariah Milling, as well. 07/10/2020 upon evaluation today patient unfortunately appears to be doing worse even than when I saw him on Friday from the standpoint of where his legs stand. The main issue is that he has increased erythema although  there is still not warm to touch I am concerned about infection and about this getting significantly worse. Again my initial concern was more fluid overload and I discussed this with his cardiology office on Friday. With that being said I feel like this has gotten worse his daughter is in agreement and I think that the best option would be to send him to the hospital at this point. READMISSION 09/06/2020 Is [name] patient who has had 2 stays in this clinic firstly in 2020 and more recently from 06/02/2020 through 07/10/2020. He was cared for by [facility]. When he was last here he had bilateral lower extremity ulcers in the setting of chronic venous insufficiency with lymphedema. When he we last saw him he had weeping edema fluido Cellulitis and he was sent to the hospital. He was admitted initially from 06/30/2020 through 07/14/2020 with cellulitis plus stasis dermatitis. Treated empirically with vancomycin and had increases in his torsemide. He was then sent to the skilled level of [facility] but readmitted to hospital from 07/29/2020 through 08/10/2020 with blood loss anemia hemoglobin of 4 and OB positive stools. During this hospitalization he was noted to have [name] sacral wound at stage II. He was sent to [facility] [place] again in the skilled level. He had compression on his legs with Coban. He is now back at his independent living setting.  He has not been wearing compression and his daughter feels there is already increasing swelling. To have stockings at home not exactly sure at what strength. [name] HiltsSCHENK, Ramadan [name]. ([id number]) Past medical history includes lymphedema, chronic venous insufficiency, pacemaker, congestive heart failure, hypertension, aortic valve stenosis status post mechanical valve replacement in 2002 on chronic Coumadin, chronic kidney disease stage IIIb, atrial fibrillation, peripheral neuropathy, large hiatal hernia. We did not test his ABIs in the clinic  today. Readmission: 10/17/2020 on evaluation today patient appears to be doing somewhat poorly in regard to his left leg and the fact that he does have an open wound at this point. He has not had the compression socks placed at the facility they have not been putting them on at all unfortunately. He does not have on the right leg currently and apparently there is significant put him on the left leg with the wound which I understand is that can be somewhat tight obviously. Nonetheless I do believe the right leg needs to have [name] compression sock and I do believe on the left side right now we can do something [name] little better in order to try to get the wound healed at this point. The patient is on torsemide and currently allopurinol for gout. He also has daily weights due to congestive heart failure I am assuming in order to monitor his fluid status overall. He is currently [name] DNR at the facility. 10/30/2020 upon evaluation today patient appears to be doing somewhat poorly at this time in regard to his bilateral lower extremities. His left leg which is the only place where he had [name] wound last week when I saw him is actually doing worse with new wound openings. The right leg also has [name] small wound as well. However the most concerning thing is that last week he did not have any signs of infection or erythema there is no warmth to touch over the leg. This week the entire leg is more sore he also has erythema extending from the ankle to just below the knee with the area being erythematous and warm to touch which I believe represents cellulitis at minimum. I am concerned about how this is spread so quickly and the fact that he is having increased pain and also not feeling as well. All in all I discussed with the patient that unfortunately I feel like he may be best served going to the ER for further evaluation of this issue currently to try to see about the potential for IV antibiotics and to have  appropriate lab work performed. His daughter is present during the visit today and she was included in the decision making at this point. 11/13/2020 upon evaluation today patient appears to be doing well with regard to his leg ulcer. Fortunately he does not appear to be showing signs of infection anything like it was previous. This is great news. He was sent home with IV antibiotic therapy that seems to have done extremely well for him. No fevers, chills, nausea, vomiting, or diarrhea. 11/27/2020 upon evaluation today patient appears to be doing quite well in regard to his wounds currently. Fortunately there is no signs of active infection at this time which is great news and I am very pleased in this regard. He did have some concerns or his daughter did about an area on his gluteal region. With that being said I see nothing that is actually open at this point which is great news I do think continuing with [name] derma  cloud is [name] good way to go. With regard to his leg he does have [name] new area on the right leg of open wound locations which is new compared to last visit we will need to address this as well. Otherwise he seems to be doing quite well in my opinion. 12/15/2020 upon evaluation today patient appears to be doing well with regard to his leg ulcer on the left. With that being said he did not have the Tubigrip on which I think is integral to getting this closed as he continues to weep. Subsequently I think the Tubigrip will help control some of the edema which will help him in that regard. Fortunately there is no signs of active infection at this time. No fevers, chills, nausea, vomiting, or diarrhea. 01/01/2021 upon evaluation today patient appears to be doing well with regard to his wound. This is measuring better and actually is significantly smaller. Fortunately there is no signs of active infection at this time. No fevers, chills, nausea, vomiting, or diarrhea. 01/15/2021 on evaluation today patient  appears to be doing well with regard to his left leg ulcer though he still continues to weep quite [name] bit he did not have the Tubigrip on the day that he supposed to. Fortunately there is no signs of active infection at this time. No fevers, chills, nausea, vomiting, or diarrhea. 01/29/2021 upon evaluation today patient appears to be doing well with regard to his right leg which is completely healed. His left leg though not healed is just [name] very small open area that is weeping. With regard to his gluteal region this is actually new and in fact he has [name] wound that is really in the sacral area. This is stated to be [name] stage II that may be the case there is really no slough buildup I Ernie Hew leave it as such for the time being. We will keep [name] close eye on things however and see how it proceeds. 02/12/2021 upon evaluation today patient appears to be doing excellent in regard to his leg ulcers. In fact everything appears to be closed as best I can tell at this point. I think he is ready to transition into his compression stocking. With that being said his gluteal region still is open although it appears to be fairly clean I think the collagen is still [name] good thing to do here. We will see how things continue to improve over the next week or so. 02/26/2021 on evaluation today patient appears to be doing well with regard to his wound. He is tolerating the dressing changes without complication the wound is smaller though I feel like he is at the point where we need to try to do something to dry this up [name] little bit more I think switching from silver collagen to [name] silver alginate would be beneficial. 03/16/2021 upon evaluation today patient appears to be doing excellent in regard to his wound. He has been tolerating the dressing changes without complication. Fortunately there is no signs of active infection at this time. No fevers, chills, nausea, vomiting, or diarrhea. 04/02/2021 upon evaluation today patient appears to  be doing well with regard to his wound. He really does not have anything terribly open but nonetheless he still has [name] lot of redness in the sacral area I think there is still [name] lot of pressure getting to this region. With that being said I think he really needs to have more appropriate offloading I think that may be the biggest issue  he spends [name] lot of time sitting in his recliner from what I am hearing. Electronic Signature(s) Signed: 04/02/2021 10:26:26 AM By: Lenda Kelp PA-C Entered By: Lenda Kelp on 04/02/2021 10:26:26 Joellyn Quails ([id number]) -------------------------------------------------------------------------------- Physical Exam Details Patient Name: [name]. Date of Service: 04/02/2021 9:30 AM Medical Record Number: [id number] Patient Account Number: [id number] Date of Birth/Sex: [birth year] (87 y.o. M) Treating RN: Hansel Feinstein Primary Care Provider: [name] Other Clinician: Referring Provider: [name] Treating Provider/Extender: Rowan Blase in Treatment: 23 Constitutional Well-nourished and well-hydrated in no acute distress. Respiratory normal breathing without difficulty. Psychiatric this patient is able to make decisions and demonstrates good insight into disease process. Alert and Oriented x 3. pleasant and cooperative. Notes Patient's wound bed currently in the sacral area does not appear to be terrible but he does have one there as well as in the left gluteal region. It also appears that the dressing may not be covering fully over the region that it needs to. This is something that we are addressing in the orders as well. With that being said I do think the patient really needs to have more aggressive offloading. Really he should not be sitting up in the chair for any longer than 2 hours maximum that he needs to spend 2 hours in the bed offloaded and rotating back-and-forth for this on [name] schedule. Otherwise I think he is can be  getting too much pressure to 1 area which is going to cause the biggest issue here. Electronic Signature(s) Signed: 04/02/2021 10:27:10 AM By: Lenda Kelp PA-C Entered By: Lenda Kelp on 04/02/2021 10:27:10 Joellyn Quails ([id number]) -------------------------------------------------------------------------------- Physician Orders Details Patient Name: [name]. Date of Service: 04/02/2021 9:30 AM Medical Record Number: [id number] Patient Account Number: [id number] Date of Birth/Sex: 04-25-1931 (87 y.o. M) Treating RN: Hansel Feinstein Primary Care Provider: [name] Other Clinician: Referring Provider: [name] Treating Provider/Extender: Rowan Blase in Treatment: 23 Verbal / Phone Orders: No Diagnosis Coding Follow-up Appointments o Return Appointment in 2 weeks. Edema Control - Lymphedema / Segmental Compressive Device / Other Bilateral Lower Extremities o Patient to wear own compression stockings. Remove compression stockings every night before going to bed and put on every morning when getting up. - bilat lower leg o Elevate legs to the level of the heart and pump ankles as often as possible o Elevate leg(s) parallel to the floor when sitting. Off-Loading o Gel wheelchair cushion - keep gel cushion under him when out of bed at all times o Turn and reposition every 2 hours - keep off of sacral wound area/REPOSITION EVERY 2 HOURS FOR WOUND HEALIN Additional Orders / Instructions Wound #12 Sacrum o Other: - KEEP DRESSINGS ON BOTH SACRAL WOUNDS/FOLLOW WOUND CARE ORDERS Wound #13 Left Gluteus o Other: - KEEP DRESSINGS ON BOTH SACRAL WOUNDS/FOLLOW WOUND CARE ORDERS Wound Treatment Wound #12 - Sacrum Cleanser: Normal Saline (Generic) 3 x Per Week/30 Days Discharge Instructions: Wash your hands with soap and water. Remove old dressing, discard into plastic bag and place into trash. Cleanse the wound with Normal Saline prior to applying [name]  clean dressing using gauze sponges, not tissues or cotton balls. Do not scrub or use excessive force. Pat dry using gauze sponges, not tissue or cotton balls. Primary Dressing: Silvercel Small 2x2 (in/in) (Generic) 3 x Per Week/30 Days Discharge Instructions: Apply Silvercel Small 2x2 (in/in) as instructed Secondary Dressing: Mepilex Border Flex, 4x4 (in/in) (Generic) 3 x Per Week/30 Days Discharge  Instructions: Apply to wound as directed. Do not cut. Wound #13 - Gluteus Wound Laterality: Left Cleanser: Normal Saline (Generic) 3 x Per Week/30 Days Discharge Instructions: Wash your hands with soap and water. Remove old dressing, discard into plastic bag and place into trash. Cleanse the wound with Normal Saline prior to applying [name] clean dressing using gauze sponges, not tissues or cotton balls. Do not scrub or use excessive force. Pat dry using gauze sponges, not tissue or cotton balls. Primary Dressing: Silvercel Small 2x2 (in/in) (Generic) 3 x Per Week/30 Days Discharge Instructions: Apply Silvercel Small 2x2 (in/in) as instructed Secondary Dressing: Mepilex Border Flex, 4x4 (in/in) (Generic) 3 x Per Week/30 Days Discharge Instructions: Apply to wound as directed. Do not cut. Electronic Signature(s) Signed: 04/02/2021 10:31:37 AM By: Hansel Feinstein Signed: 04/02/2021 6:51:50 PM By: Lenda Kelp PA-C Entered By: Hansel Feinstein on 04/02/2021 10:20:28 Joellyn Quails ([id number]) -------------------------------------------------------------------------------- Problem List Details Patient Name: [name]. Date of Service: 04/02/2021 9:30 AM Medical Record Number: [id number] Patient Account Number: [id number] Date of Birth/Sex: [birth year] (87 y.o. M) Treating RN: Hansel Feinstein Primary Care Provider: [name] Other Clinician: Referring Provider: [name] Treating Provider/Extender: Rowan Blase in Treatment: 23 Active Problems ICD-10 Encounter Code Description Active Date  MDM Diagnosis I89.0 Lymphedema, not elsewhere classified 10/17/2020 No Yes I87.2 Venous insufficiency (chronic) (peripheral) 10/17/2020 No Yes L97.822 Non-pressure chronic ulcer of other part of left lower leg with fat layer 10/17/2020 No Yes exposed L97.812 Non-pressure chronic ulcer of other part of right lower leg with fat layer 10/17/2020 No Yes exposed L89.152 Pressure ulcer of sacral region, stage 2 01/29/2021 No Yes Z79.01 Long term (current) use of anticoagulants 10/17/2020 No Yes Z95.0 Presence of cardiac pacemaker 10/17/2020 No Yes I10 Essential (primary) hypertension 10/17/2020 No Yes 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 ([id number]) Entered By: Lenda Kelp on 04/02/2021 10:25:43 Schweer, Ayden [name]. ([id number]) -------------------------------------------------------------------------------- Progress Note Details Patient Name: [name]. Date of Service: 04/02/2021 9:30 AM Medical Record Number: [id number] Patient Account Number: [id number] Date of Birth/Sex: [birth year] (87 y.o. M) Treating RN: Hansel Feinstein Primary Care Provider: [name] Other Clinician: Referring Provider: [name] Treating Provider/Extender: Rowan Blase in Treatment: 23 Subjective Chief Complaint Information obtained from Patient Sacral and Left Gluteal Ulcers History of Present Illness (HPI) 04/21/2019 ADMISSION This is an independent 87 year old man who lives in the independent part of [address] [place]. He has [name] history of chronic lower extremity edema and wears compression stockings. He states about [name] month ago he was taking off the one on the right and pulled some skin  off accidentally. He has had 2 small wounds on the right anterior and right medial lower extremity. I note looking through [facility] health link that he had multiple venous ultrasounds in 2015 and 16. These were DVT rule outs. He did have [name] Baker's cyst on the left. He has not not had [name] previous wound history although he did have [name] history of cellulitis in his legs. Past medical history; includes aortic stenosis status post mechanical AVR in 2007 on chronic Coumadin, lower extremity edema with [name] history of cellulitis, history of MRSA. ABIs in our clinic were 1.1 on the right 6/3; patient with predominantly venous insufficiency ulcers in the right lower  leg probably some degree of lymphedema. He had to wounds last week. We put him in 3 layer compression. The nurses at [facility] are changing the dressing. The area laterally is healed but he still has [name] small very painful area on the anterior tibial area. He is on Coumadin because of mechanical aortic valve. 6/10; venous insufficiency ulcers on the right lower leg. He has some degree of lymphedema. We have been using 3 layer compression with silver collagen small wound. Dimensions are improved. I gave him doxycycline last week which he tolerated because of surrounding erythema. This is improved also 6/17; arrives in clinic today with portable silver collagen dressing tightly adherent to the wound. Also felt that the wrap was too tight 3 layer being changed at the [facility] 6/24; patient's wound is small still adherent debris over the surface however even under illumination it was hard to see what was still open here. We have been using silver collagen 7/1; the patient arrives in the clinic today and the area on the right lower leg is healed. He has severe chronic venous insufficiency. He has 20/30 compression stockings. Readmission: 06/22/2020 on evaluation today patient presents for reevaluation here in our clinic although it has  been [name] little over [name] year since we last saw him. He does have [name] history of lymphedema, venous insufficiency, anticoagulant therapy, he is on [name] pacemaker which is the reason for the anticoagulants, hypertension, and congestive heart failure. With that being said unfortunately he has been dealing with [name] wound of the left and right legs which has been giving him some trouble since arrival first 2021. That is in regard to the left leg ulcer. In regard to the right leg ulcer this is just [name] very small area that really I think will seal up quite nicely is more of the lymphedema opening than anything else. With that being said unfortunately the left leg is quite significant. We actually have noted that the patient's been dealing with this for quite some time and I think that there is [name] chance we may want to consider biopsy. With that being said he is on Coumadin therefore we were not able to perform [name] biopsy at this point. I think that something however in the future that may be warranted we may just have to take him off of the current Coumadin regimen in order to do this. We discussed doing it today but I am more concerned about the fact that he could have issues with uncontrolled bleeding and in that case we may have to send him to the ER. His daughter was not wanting to take that chance which I can completely understand. Nonetheless so far they have been using antibiotic ointment over the area I am just putting [name] protective dressing I do believe the patient is that he needs some type of compression. 07/07/2020 on evaluation today patient appears to be doing poorly in regard to his bilateral lower extremities today. He actually went to urgent care last night due to having pain in his legs due to the wraps. It appears they got somewhat tight on him because of the fact that he is having such significant swelling. He appears to be fluid overloaded I do not see any signs of infection actively at this point which  is good news but unfortunately I think that this is something that may need to be addressed even by cardiology more so than just with [name] different or alternate type of wrap.  I will contact his cardiologist, Dr. Mariah Milling, as well. 07/10/2020 upon evaluation today patient unfortunately appears to be doing worse even than when I saw him on Friday from the standpoint of where his legs stand. The main issue is that he has increased erythema although there is still not warm to touch I am concerned about infection and about this getting significantly worse. Again my initial concern was more fluid overload and I discussed this with his cardiology office on Friday. With that being said I feel like this has gotten worse his daughter is in agreement and I think that the best option would be to send him to the hospital at this point. READMISSION 09/06/2020 Is [name] patient who has had 2 stays in this clinic firstly in 2020 and more recently from 06/02/2020 through 07/10/2020. He was cared for by [facility]. When he was last here he had bilateral lower extremity ulcers in the setting of chronic venous insufficiency with lymphedema. When he we last saw him he had weeping edema fluido Cellulitis and he was sent to the hospital. He was admitted initially from 06/30/2020 through 07/14/2020 [facility], Yanky [name]. ([id number]) with cellulitis plus stasis dermatitis. Treated empirically with vancomycin and had increases in his torsemide. He was then sent to the skilled level of [facility] but readmitted to hospital from 07/29/2020 through 08/10/2020 with blood loss anemia hemoglobin of 4 and OB positive stools. During this hospitalization he was noted to have [name] sacral wound at stage II. He was sent to [facility] [place] again in the skilled level. He had compression on his legs with Coban. He is now back at his independent living setting. He has not been wearing compression and his daughter feels there is already increasing  swelling. To have stockings at home not exactly sure at what strength. Past medical history includes lymphedema, chronic venous insufficiency, pacemaker, congestive heart failure, hypertension, aortic valve stenosis status post mechanical valve replacement in 2002 on chronic Coumadin, chronic kidney disease stage IIIb, atrial fibrillation, peripheral neuropathy, large hiatal hernia. We did not test his ABIs in the clinic today. Readmission: 10/17/2020 on evaluation today patient appears to be doing somewhat poorly in regard to his left leg and the fact that he does have an open wound at this point. He has not had the compression socks placed at the facility they have not been putting them on at all unfortunately. He does not have on the right leg currently and apparently there is significant put him on the left leg with the wound which I understand is that can be somewhat tight obviously. Nonetheless I do believe the right leg needs to have [name] compression sock and I do believe on the left side right now we can do something [name] little better in order to try to get the wound healed at this point. The patient is on torsemide and currently allopurinol for gout. He also has daily weights due to congestive heart failure I am assuming in order to monitor his fluid status overall. He is currently [name] DNR at the facility. 10/30/2020 upon evaluation today patient appears to be doing somewhat poorly at this time in regard to his bilateral lower extremities. His left leg which is the only place where he had [name] wound last week when I saw him is actually doing worse with new wound openings. The right leg also has [name] small wound as well. However the most concerning thing is that last week he did not have any signs  of infection or erythema there is no warmth to touch over the leg. This week the entire leg is more sore he also has erythema extending from the ankle to just below the knee with the area being erythematous and  warm to touch which I believe represents cellulitis at minimum. I am concerned about how this is spread so quickly and the fact that he is having increased pain and also not feeling as well. All in all I discussed with the patient that unfortunately I feel like he may be best served going to the ER for further evaluation of this issue currently to try to see about the potential for IV antibiotics and to have appropriate lab work performed. His daughter is present during the visit today and she was included in the decision making at this point. 11/13/2020 upon evaluation today patient appears to be doing well with regard to his leg ulcer. Fortunately he does not appear to be showing signs of infection anything like it was previous. This is great news. He was sent home with IV antibiotic therapy that seems to have done extremely well for him. No fevers, chills, nausea, vomiting, or diarrhea. 11/27/2020 upon evaluation today patient appears to be doing quite well in regard to his wounds currently. Fortunately there is no signs of active infection at this time which is great news and I am very pleased in this regard. He did have some concerns or his daughter did about an area on his gluteal region. With that being said I see nothing that is actually open at this point which is great news I do think continuing with [name] derma cloud is [name] good way to go. With regard to his leg he does have [name] new area on the right leg of open wound locations which is new compared to last visit we will need to address this as well. Otherwise he seems to be doing quite well in my opinion. 12/15/2020 upon evaluation today patient appears to be doing well with regard to his leg ulcer on the left. With that being said he did not have the Tubigrip on which I think is integral to getting this closed as he continues to weep. Subsequently I think the Tubigrip will help control some of the edema which will help him in that regard. Fortunately  there is no signs of active infection at this time. No fevers, chills, nausea, vomiting, or diarrhea. 01/01/2021 upon evaluation today patient appears to be doing well with regard to his wound. This is measuring better and actually is significantly smaller. Fortunately there is no signs of active infection at this time. No fevers, chills, nausea, vomiting, or diarrhea. 01/15/2021 on evaluation today patient appears to be doing well with regard to his left leg ulcer though he still continues to weep quite [name] bit he did not have the Tubigrip on the day that he supposed to. Fortunately there is no signs of active infection at this time. No fevers, chills, nausea, vomiting, or diarrhea. 01/29/2021 upon evaluation today patient appears to be doing well with regard to his right leg which is completely healed. His left leg though not healed is just [name] very small open area that is weeping. With regard to his gluteal region this is actually new and in fact he has [name] wound that is really in the sacral area. This is stated to be [name] stage II that may be the case there is really no slough buildup I Ernie HewMinna leave it as such  for the time being. We will keep [name] close eye on things however and see how it proceeds. 02/12/2021 upon evaluation today patient appears to be doing excellent in regard to his leg ulcers. In fact everything appears to be closed as best I can tell at this point. I think he is ready to transition into his compression stocking. With that being said his gluteal region still is open although it appears to be fairly clean I think the collagen is still [name] good thing to do here. We will see how things continue to improve over the next week or so. 02/26/2021 on evaluation today patient appears to be doing well with regard to his wound. He is tolerating the dressing changes without complication the wound is smaller though I feel like he is at the point where we need to try to do something to dry this up [name] little bit  more I think switching from silver collagen to [name] silver alginate would be beneficial. 03/16/2021 upon evaluation today patient appears to be doing excellent in regard to his wound. He has been tolerating the dressing changes without complication. Fortunately there is no signs of active infection at this time. No fevers, chills, nausea, vomiting, or diarrhea. 04/02/2021 upon evaluation today patient appears to be doing well with regard to his wound. He really does not have anything terribly open but nonetheless he still has [name] lot of redness in the sacral area I think there is still [name] lot of pressure getting to this region. With that being said I think he really needs to have more appropriate offloading I think that may be the biggest issue he spends [name] lot of time sitting in his recliner from what I am hearing. Jaeger, Hillman [name]. ([id number]) Objective Constitutional Well-nourished and well-hydrated in no acute distress. Vitals Time Taken: 9:36 AM, Temperature: 97.5 F, Pulse: 75 bpm, Respiratory Rate: 18 breaths/min, Blood Pressure: 113/66 mmHg. Respiratory normal breathing without difficulty. Psychiatric this patient is able to make decisions and demonstrates good insight into disease process. Alert and Oriented x 3. pleasant and cooperative. General Notes: Patient's wound bed currently in the sacral area does not appear to be terrible but he does have one there as well as in the left gluteal region. It also appears that the dressing may not be covering fully over the region that it needs to. This is something that we are addressing in the orders as well. With that being said I do think the patient really needs to have more aggressive offloading. Really he should not be sitting up in the chair for any longer than 2 hours maximum that he needs to spend 2 hours in the bed offloaded and rotating back-and-forth for this on [name] schedule. Otherwise I think he is can be getting too much pressure to 1 area  which is going to cause the biggest issue here. Integumentary (Hair, Skin) Wound #12 status is Open. Original cause of wound was Pressure Injury. The date acquired was: 01/18/2021. The wound has been in treatment 9 weeks. The wound is located on the Sacrum. The wound measures 0.2cm length x 0.3cm width x 0.1cm depth; 0.047cm^2 area and 0.005cm^3 volume. There is Fat Layer (Subcutaneous Tissue) exposed. There is [name] small amount of serous drainage noted. The wound margin is flat and intact. There is large (67-100%) red, pink granulation within the wound bed. There is [name] small (1-33%) amount of necrotic tissue within the wound bed. Wound #13 status is Open. Original cause of wound  was Shear/Friction. The date acquired was: 03/31/2021. The wound is located on the Left Gluteus. The wound measures 0.3cm length x 0.3cm width x 0.1cm depth; 0.071cm^2 area and 0.007cm^3 volume. There is Fat Layer (Subcutaneous Tissue) exposed. There is no tunneling or undermining noted. There is [name] medium amount of serous drainage noted. The wound margin is flat and intact. There is small (1-33%) granulation within the wound bed. There is [name] large (67-100%) amount of necrotic tissue within the wound bed including Adherent Slough. 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 Edema Control - Lymphedema / Segmental Compressive Device  / Other: Patient to wear own compression stockings. Remove compression stockings every night before going to bed and put on every morning when getting up. - bilat lower leg Elevate legs to the level of the heart and pump ankles as often as possible Elevate leg(s) parallel to the floor when sitting. Off-Loading: Gel wheelchair cushion - keep gel cushion under him when out of bed at all times Turn and reposition every 2 hours - keep off of sacral wound area/REPOSITION EVERY 2 HOURS FOR WOUND HEALIN Additional Orders / Instructions: Wound #12 SacrumYitzchok Carriger, Rai [name]. ([id number]) Other: - KEEP DRESSINGS ON BOTH SACRAL WOUNDS/FOLLOW WOUND CARE ORDERS Wound #13 Left Gluteus: Other: - KEEP DRESSINGS ON BOTH SACRAL WOUNDS/FOLLOW WOUND CARE ORDERS WOUND #12: - Sacrum Wound Laterality: Cleanser: Normal Saline (Generic) 3 x Per Week/30 Days Discharge Instructions: Wash your hands with soap and water. Remove old dressing, discard into plastic bag and place into trash. Cleanse the wound with Normal Saline prior to applying [name] clean dressing using gauze sponges, not tissues or cotton balls. Do not scrub or use excessive force. Pat dry using gauze sponges, not tissue or cotton balls. Primary Dressing: Silvercel Small 2x2 (in/in) (Generic) 3 x Per Week/30 Days Discharge Instructions: Apply Silvercel Small 2x2 (in/in) as instructed Secondary Dressing: Mepilex Border Flex, 4x4 (in/in) (Generic) 3 x Per Week/30 Days Discharge Instructions: Apply to wound as directed. Do not cut. WOUND #13: - Gluteus Wound Laterality: Left Cleanser: Normal Saline (Generic) 3 x Per Week/30 Days Discharge Instructions: Wash your hands with soap and water. Remove old dressing, discard into plastic bag and place into trash. Cleanse the wound with Normal Saline prior to applying [name] clean dressing using gauze sponges, not tissues or cotton balls. Do not scrub or use excessive force. Pat dry using gauze sponges, not tissue or  cotton balls. Primary Dressing: Silvercel Small 2x2 (in/in) (Generic) 3 x Per Week/30 Days Discharge Instructions: Apply Silvercel Small 2x2 (in/in) as instructed Secondary Dressing: Mepilex Border Flex, 4x4 (in/in) (Generic) 3 x Per Week/30 Days Discharge Instructions: Apply to wound as directed. Do not cut. 1. I would recommend currently that we go ahead and initiate [name] continuation of treatment with silver alginate dressing followed by the border foam dressing I think this is [name] good way to go. 2. I am also can recommend that we have the patient offload every 2 hours if he spends 2 hours in the chair and he  spent 2 hours in the bed and back and forth. This was written out very specifically and in detail for the facility. 3. I am also can recommend that the patient continue to be monitored for any signs of worsening or infection. Obviously if anything occurs that is of concern should let me know soon as possible. We will see patient back for reevaluation in 1 week here in the clinic. If anything worsens or changes patient will contact our office for additional recommendations. Electronic Signature(s) Signed: 04/02/2021 10:27:48 AM By: Lenda Kelp PA-C Entered By: Lenda Kelp on 04/02/2021 10:27:48 Joellyn Quails ([id number]) -------------------------------------------------------------------------------- SuperBill Details Patient Name: [name]. Date of Service: 04/02/2021 Medical Record Number: [id number] Patient Account Number: [id number] Date of Birth/Sex: 20-Mar-1931 (87 y.o. M) Treating RN: Hansel Feinstein Primary Care Provider: [name] Other Clinician: Referring Provider: [name] Treating Provider/Extender: Rowan Blase in Treatment: 23 Diagnosis Coding ICD-10 Codes Code Description I89.0 Lymphedema, not elsewhere classified I87.2 Venous insufficiency (chronic) (peripheral) L97.822 Non-pressure chronic ulcer of other part of left lower leg with fat  layer exposed L97.812 Non-pressure chronic ulcer of other part of right lower leg with fat layer exposed L89.152 Pressure ulcer of sacral region, stage 2 Z79.01 Long term (current) use of anticoagulants Z95.0 Presence of cardiac pacemaker I10 Essential (primary) hypertension I50.42 Chronic combined systolic (congestive) and diastolic (congestive) heart failure I25.10 Atherosclerotic heart disease of native coronary artery without angina pectoris Physician Procedures CPT4 Code: [id number] Description: 99213 - WC PHYS LEVEL 3 - EST PT Modifier: Quantity: 1 CPT4 Code: Description: ICD-10 Diagnosis Description I89.0 Lymphedema, not elsewhere classified I87.2 Venous insufficiency (chronic) (peripheral) L97.822 Non-pressure chronic ulcer of other part of left lower leg with fat lay L97.812 Non-pressure chronic ulcer of  other part of right lower leg with fat la Modifier: er exposed yer exposed Quantity: Electronic Signature(s) Signed: 04/02/2021 10:28:04 AM By: Lenda Kelp PA-C Entered By: Lenda Kelp on 04/02/2021 10:28:04

## 2021-04-03 NOTE — Progress Notes (Signed)
VALERIE, FREDIN ([id number]) Visit Report for 04/02/2021 Arrival Information Details Patient Name: [name], [name] Date of Service: 04/02/2021 9:30 [name] Medical Record Number: [id number] Patient Account Number: [id number] Date of Birth/Sex: 1930/12/10 (87 y.o. M) Treating RN: Huel Coventry Primary Care Naimah Yingst: [name] Other Clinician: Referring Vinny Taranto: [name] Treating Yeiden Frenkel/Extender: Rowan Blase in Treatment: 23 Visit Information History Since Last Visit Added or deleted any medications: No Patient Arrived: Wheel Chair Pain Present Now: Yes Arrival Time: 09:34 Accompanied By: daugher Transfer Assistance: Manual Patient Identification Verified: Yes Secondary Verification Process Completed: Yes Patient Requires Transmission-Based Precautions: No Patient Has Alerts: No Electronic Signature(s) Signed: 04/03/2021 10:18:25 [name] By: Elliot Gurney, BSN, RN, CWS, [name] RN, BSN Entered By: Elliot Gurney, BSN, RN, CWS, [name] on 04/02/2021 09:36:24 [name] ([id number]) -------------------------------------------------------------------------------- Encounter Discharge Information Details Patient Name: [name] Hilts A. Date of Service: 04/02/2021 9:30 [name] Medical Record Number: [id number] Patient Account Number: [id number] Date of Birth/Sex: June 04, 1931 (87 y.o. M) Treating RN: Hansel Feinstein Primary Care Lonell Stamos: [name] Other Clinician: Referring Jefferie Holston: [name] Treating Nithin Demeo/Extender: Rowan Blase in Treatment: 23 Encounter Discharge Information Items Discharge Condition: Stable Ambulatory Status: Wheelchair Discharge Destination: Skilled Nursing Facility Telephoned: No Orders Sent: Yes Transportation: Other Accompanied By: daughter Schedule Follow-up Appointment: Yes Clinical Summary of Care: Electronic Signature(s) Signed: 04/02/2021 10:31:37 [name] By: Hansel Feinstein Entered By: Hansel Feinstein on 04/02/2021 10:29:01 [name]  ([id number]) -------------------------------------------------------------------------------- Lower Extremity Assessment Details Patient Name: [name] Hilts A. Date of Service: 04/02/2021 9:30 [name] Medical Record Number: [id number] Patient Account Number: [id number] Date of Birth/Sex: 1931/02/01 (87 y.o. M) Treating RN: Huel Coventry Primary Care Ansley Mangiapane: [name] Other Clinician: Referring Ayuub Penley: [name] Treating Kairav Russomanno/Extender: Rowan Blase in Treatment: 23 Electronic Signature(s) Signed: 04/03/2021 10:18:25 [name] By: Elliot Gurney, BSN, RN, CWS, [name] RN, BSN Entered By: Elliot Gurney, BSN, RN, CWS, [name] on 04/02/2021 09:49:33 [name] ([id number]) -------------------------------------------------------------------------------- Multi Wound Chart Details Patient Name: [name] Hilts A. Date of Service: 04/02/2021 9:30 [name] Medical Record Number: [id number] Patient Account Number: [id number] Date of Birth/Sex: 1931/04/11 (87 y.o. M) Treating RN: Hansel Feinstein Primary Care Naleah Kofoed: [name] Other Clinician: Referring Toure Edmonds: [name] Treating Jamey Demchak/Extender: Rowan Blase in Treatment: 23 Vital Signs Height(in): Pulse(bpm): 75 Weight(lbs): Blood Pressure(mmHg): 113/66 Body Mass Index(BMI): Temperature(F): 97.5 Respiratory Rate(breaths/min): 18 Photos: [N/A:N/A] Wound Location: Sacrum Left Gluteus N/A Wounding Event: Pressure Injury Shear/Friction N/A Primary Etiology: Pressure Ulcer Pressure Ulcer N/A Comorbid History: Cataracts, Sleep Apnea, Congestive Cataracts, Sleep Apnea, Congestive N/A Heart Failure, Coronary Artery Heart Failure, Coronary Artery Disease, Hypertension, Disease, Hypertension, Osteoarthritis Osteoarthritis Date Acquired: 01/18/2021 03/31/2021 N/A Weeks of Treatment: 9 0 N/A Wound Status: Open Open N/A Measurements L x W x D (cm) 0.2x0.3x0.1 0.3x0.3x0.1 N/A Area (cm) : 0.047 0.071 N/A Volume (cm) : 0.005 0.007 N/A % Reduction in  Area: 85.80% 0.00% N/A % Reduction in Volume: 94.90% 0.00% N/A Classification: Category/Stage II Category/Stage II N/A Exudate Amount: Small Medium N/A Exudate Type: Serous Serous N/A Exudate Color: amber amber N/A Wound Margin: Flat and Intact Flat and Intact N/A Granulation Amount: Large (67-100%) Small (1-33%) N/A Granulation Quality: Red, Pink N/A N/A Necrotic Amount: Small (1-33%) Large (67-100%) N/A Exposed Structures: Fat Layer (Subcutaneous Tissue): Fat Layer (Subcutaneous Tissue): N/A Yes Yes Fascia: No Fascia: No Tendon: No Tendon: No Muscle: No Muscle: No Joint: No Joint: No Bone: No Bone: No Epithelialization: None None N/A Treatment Notes Electronic Signature(s) Signed: 04/02/2021 10:31:37 [name] By: Hansel Feinstein Entered ByHansel Feinstein on 04/02/2021 10:13:47  [name], [name] AMarland Kitchen ([id number]) -------------------------------------------------------------------------------- Multi-Disciplinary Care Plan Details Patient Name: [name], [name] Date of Service: 04/02/2021 9:30 [name] Medical Record Number: [id number] Patient Account Number: [id number] Date of Birth/Sex: 02/07/1931 (87 y.o. M) Treating RN: Hansel Feinstein Primary Care Warren Kugelman: [name] Other Clinician: Referring Kilynn Fitzsimmons: [name] Treating Hamda Klutts/Extender: Allen Derry Weeks in Treatment: 23 Active Inactive Electronic Signature(s) Signed: 04/02/2021 10:31:37 [name] By: Hansel Feinstein Entered By: Hansel Feinstein on 04/02/2021 10:13:39 [name] Hilts A. ([id number]) -------------------------------------------------------------------------------- Pain Assessment Details Patient Name: [name] Hilts A. Date of Service: 04/02/2021 9:30 [name] Medical Record Number: [id number] Patient Account Number: [id number] Date of Birth/Sex: 07/05/1931 (87 y.o. M) Treating RN: Huel Coventry Primary Care Kasee Hantz: [name] Other Clinician: Referring Evren Shankland: [name] Treating Wania Longstreth/Extender: Rowan Blase in  Treatment: 23 Active Problems Location of Pain Severity and Description of Pain Patient Has Paino Yes Site Locations Pain Location: Pain in Ulcers Rate the pain. Current Pain Level: 7 Pain Management and Medication Current Pain Management: Electronic Signature(s) Signed: 04/03/2021 10:18:25 [name] By: Elliot Gurney, BSN, RN, CWS, [name] RN, BSN Entered By: Elliot Gurney, BSN, RN, CWS, [name] on 04/02/2021 09:37:05 [name] ([id number]) -------------------------------------------------------------------------------- Patient/Caregiver Education Details Patient Name: [name] Hilts A. Date of Service: 04/02/2021 9:30 [name] Medical Record Number: [id number] Patient Account Number: [id number] Date of Birth/Gender: 01-23-1931 (87 y.o. M) Treating RN: Hansel Feinstein Primary Care Physician: [name] Other Clinician: Referring Physician: [name] Treating Physician/Extender: Rowan Blase in Treatment: 23 Education Assessment Education Provided To: Patient and Caregiver daughter present and orders to SNF Education Topics Provided Basic Hygiene: Wound/Skin Impairment: Electronic Signature(s) Signed: 04/02/2021 10:31:37 [name] By: Hansel Feinstein Entered By: Hansel Feinstein on 04/02/2021 10:14:24 [name] Hilts A. ([id number]) -------------------------------------------------------------------------------- Wound Assessment Details Patient Name: [name] Hilts A. Date of Service: 04/02/2021 9:30 [name] Medical Record Number: [id number] Patient Account Number: [id number] Date of Birth/Sex: Apr 23, 1931 (87 y.o. M) Treating RN: Huel Coventry Primary Care Rishita Petron: [name] Other Clinician: Referring Daly Whipkey: [name] Treating Shacola Schussler/Extender: Allen Derry Weeks in Treatment: 23 Wound Status Wound Number: 12 Primary Pressure Ulcer Etiology: Wound Location: Sacrum Wound Open Wounding Event: Pressure Injury Status: Date Acquired: 01/18/2021 Comorbid Cataracts, Sleep Apnea, Congestive Heart  Failure, Weeks Of Treatment: 9 History: Coronary Artery Disease, Hypertension, Osteoarthritis Clustered Wound: No Photos Wound Measurements Length: (cm) 0.2 Width: (cm) 0.3 Depth: (cm) 0.1 Area: (cm) 0.047 Volume: (cm) 0.005 % Reduction in Area: 85.8% % Reduction in Volume: 94.9% Epithelialization: None Wound Description Classification: Category/Stage II Wound Margin: Flat and Intact Exudate Amount: Small Exudate Type: Serous Exudate Color: amber Foul Odor After Cleansing: No Slough/Fibrino Yes Wound Bed Granulation Amount: Large (67-100%) Exposed Structure Granulation Quality: Red, Pink Fascia Exposed: No Necrotic Amount: Small (1-33%) Fat Layer (Subcutaneous Tissue) Exposed: Yes Tendon Exposed: No Muscle Exposed: No Joint Exposed: No Bone Exposed: No Treatment Notes Wound #12 (Sacrum) Cleanser Normal Saline Discharge Instruction: Wash your hands with soap and water. Remove old dressing, discard into plastic bag and place into trash. Cleanse the wound with Normal Saline prior to applying a clean dressing using gauze sponges, not tissues or cotton balls. Do not scrub or use excessive force. Pat dry using gauze sponges, not tissue or cotton balls. [name], [name] ([id number]) Peri-Wound Care Topical Primary Dressing Silvercel Small 2x2 (in/in) Discharge Instruction: Apply Silvercel Small 2x2 (in/in) as instructed Secondary Dressing Mepilex Border Flex, 4x4 (in/in) Discharge Instruction: Apply to wound as directed. Do not cut. Secured With Compression Wrap Compression Stockings Facilities manager) Signed: 04/03/2021 10:18:25 [name] By: Elliot Gurney, BSN,  RN, CWS, [name] RN, BSN Entered By: Elliot Gurney, BSN, RN, CWS, [name] on 04/02/2021 09:48:27 [name] ([id number]) -------------------------------------------------------------------------------- Wound Assessment Details Patient Name: [name] Hilts A. Date of Service: 04/02/2021 9:30 [name] Medical Record Number:  [id number] Patient Account Number: [id number] Date of Birth/Sex: 06-08-1931 (87 y.o. M) Treating RN: Huel Coventry Primary Care Ebany Bowermaster: [name] Other Clinician: Referring Cooper Stamp: [name] Treating Ailed Defibaugh/Extender: Allen Derry Weeks in Treatment: 23 Wound Status Wound Number: 13 Primary Pressure Ulcer Etiology: Wound Location: Left Gluteus Wound Open Wounding Event: Shear/Friction Status: Date Acquired: 03/31/2021 Comorbid Cataracts, Sleep Apnea, Congestive Heart Failure, Weeks Of Treatment: 0 History: Coronary Artery Disease, Hypertension, Osteoarthritis Clustered Wound: No Photos Wound Measurements Length: (cm) 0.3 Width: (cm) 0.3 Depth: (cm) 0.1 Area: (cm) 0.071 Volume: (cm) 0.007 % Reduction in Area: 0% % Reduction in Volume: 0% Epithelialization: None Tunneling: No Undermining: No Wound Description Classification: Category/Stage II Wound Margin: Flat and Intact Exudate Amount: Medium Exudate Type: Serous Exudate Color: amber Foul Odor After Cleansing: No Slough/Fibrino Yes Wound Bed Granulation Amount: Small (1-33%) Exposed Structure Necrotic Amount: Large (67-100%) Fascia Exposed: No Necrotic Quality: Adherent Slough Fat Layer (Subcutaneous Tissue) Exposed: Yes Tendon Exposed: No Muscle Exposed: No Joint Exposed: No Bone Exposed: No Treatment Notes Wound #13 (Gluteus) Wound Laterality: Left Cleanser Normal Saline Discharge Instruction: Wash your hands with soap and water. Remove old dressing, discard into plastic bag and place into trash. Cleanse the wound with Normal Saline prior to applying a clean dressing using gauze sponges, not tissues or cotton balls. Do not scrub or use excessive force. Pat dry using gauze sponges, not tissue or cotton balls. [name], [name] ([id number]) Peri-Wound Care Topical Primary Dressing Silvercel Small 2x2 (in/in) Discharge Instruction: Apply Silvercel Small 2x2 (in/in) as instructed Secondary  Dressing Mepilex Border Flex, 4x4 (in/in) Discharge Instruction: Apply to wound as directed. Do not cut. Secured With Compression Wrap Compression Stockings Facilities manager) Signed: 04/03/2021 10:18:25 [name] By: Elliot Gurney, BSN, RN, CWS, [name] RN, BSN Entered By: Elliot Gurney, BSN, RN, CWS, [name] on 04/02/2021 09:49:21 [name] ([id number]) -------------------------------------------------------------------------------- Vitals Details Patient Name: [name] Hilts A. Date of Service: 04/02/2021 9:30 [name] Medical Record Number: [id number] Patient Account Number: [id number] Date of Birth/Sex: September 19, 1931 (87 y.o. M) Treating RN: Huel Coventry Primary Care Treasure Ingrum: [name] Other Clinician: Referring Raylen Tangonan: [name] Treating Tomma Ehinger/Extender: Rowan Blase in Treatment: 23 Vital Signs Time Taken: 09:36 Temperature (F): 97.5 Pulse (bpm): 75 Respiratory Rate (breaths/min): 18 Blood Pressure (mmHg): 113/66 Reference Range: 80 - 120 mg / dl Electronic Signature(s) Signed: 04/03/2021 10:18:25 [name] By: Elliot Gurney, BSN, RN, CWS, [name] RN, BSN Entered By: Elliot Gurney, BSN, RN, CWS, [name] on 04/02/2021 09:36:54

## 2021-04-11 ENCOUNTER — Other Ambulatory Visit: Payer: Self-pay

## 2021-04-11 ENCOUNTER — Ambulatory Visit
Admission: RE | Admit: 2021-04-11 | Discharge: 2021-04-11 | Disposition: A | Discharge status: Home / Self Care | Payer: Medicare Other | Source: Ambulatory Visit | Attending: Internal Medicine | Admitting: Internal Medicine

## 2021-04-11 DIAGNOSIS — R1310 Dysphagia, unspecified: Secondary | ICD-10-CM | POA: Insufficient documentation

## 2021-04-11 NOTE — Therapy (Signed)
[facility] DIAGNOSTIC RADIOLOGY [address] [place], [place], [address] Phone: [phone]   Fax:     Modified Barium Swallow  Patient Details  Name: [name] MRN: [id number] Date of Birth: 11/11/1931 No data recorded  Encounter Date: 04/11/2021   End of Session - 04/11/21 1815    Visit Number 1    Number of Visits 1    Date for SLP Re-Evaluation 04/11/21    SLP Start Time 1255    SLP Stop Time  1335    SLP Time Calculation (min) 40 min    Activity Tolerance Patient tolerated treatment well           Objective Swallowing Evaluation: Type of Study: MBS-Modified Barium Swallow Study   Patient Details  Name: [name] MRN: [id number] Date of Birth: Apr 28, 1931  Today's Date: 04/11/2021 Time: SLP Start Time (ACUTE ONLY): 1255 -SLP Stop Time (ACUTE ONLY): 1335  SLP Time Calculation (min) (ACUTE ONLY): 40 min   Past Medical History:  Past Medical History:  Diagnosis Date  . Anxiety 10/11  . Aortic stenosis    a. s/p mechcanical AVR, 2002; b. 10/2018 Echo: Triv AI, mean grad .  . Bradycardia    chronic, no symptoms 07/2010  . C. difficile colitis   . Carotid bruit    dopplers in past, no abnormalities  . Chronic diastolic CHF (congestive heart failure) (HCC)    a. Echo 03/2015: EF 60-65%, no RWMA, GR1DD, mild BAE, mild to mod MR, mod TR, PASP 65 mmHg; b. 01/2017 Echo: EF 55-60%, NRWMA, grade 1 diastolic dysfunction.  Normal functioning prosthetic aortic valve.  Mean gradient 50 mmHg.  Sev TR. PASP ; c. 10/2018 Echo: EF 55-60%, Triv AI, mod dil LA, mod-sev TR, PASP 35-40, mild to mod red RV fxn.  . Coronary artery disease    a. mild, cath, 08/2010; b. medically managed  . Decreased hearing    Right ear  . Depression   . Gastric ulcer   . GERD (gastroesophageal reflux disease)   . Hypertension    BP higher than usual 04/19/10; amlodipine increased by telephone  . Mod-Sev Tricuspid regurgitation     a. 10/2018 Echo: Mod-Sev TR, PASP 35-18mmHg.  Marland Kitchen RLS (restless legs syndrome) 08/23/2015  . S/P AVR    a. [employer]. mechanical 2002; b. echo 08/2010 EF 60%, trival AI, mild MR, AVR working well; c. on longterm warfarin tx  . SOB (shortness of breath) 10/11   08/2010,Episodes at 5 AM, eventually felt to be anxiety, after complete workup including catheterization, pt greatly improved with anxiety meds 11/11   Past Surgical History:  Past Surgical History:  Procedure Laterality Date  . CARDIAC CATHETERIZATION    . COLONOSCOPY WITH PROPOFOL N/A 08/03/2020   Procedure: COLONOSCOPY WITH PROPOFOL;  Surgeon: Toledo, Boykin Nearing, MD;  Location: [facility] ENDOSCOPY;  Service: Gastroenterology;  Laterality: N/A;  . ENTEROSCOPY N/A 09/17/2020   Procedure: ENTEROSCOPY;  Surgeon: Toney Reil, MD;  Location: [facility] ENDOSCOPY;  Service: Gastroenterology;  Laterality: N/A;  . ESOPHAGOGASTRODUODENOSCOPY N/A 04/05/2015   Procedure: ESOPHAGOGASTRODUODENOSCOPY (EGD);  Surgeon: Scot Jun, MD;  Location: [facility] ENDOSCOPY;  Service: Endoscopy;  Laterality: N/A;  . ESOPHAGOGASTRODUODENOSCOPY N/A 04/17/2015   Procedure: ESOPHAGOGASTRODUODENOSCOPY (EGD);  Surgeon: Scot Jun, MD;  Location: [facility] ENDOSCOPY;  Service: Endoscopy;  Laterality: N/A;  . ESOPHAGOGASTRODUODENOSCOPY N/A 08/02/2015   Procedure: ESOPHAGOGASTRODUODENOSCOPY (EGD);  Surgeon: Wallace Cullens, MD;  Location: [facility] ENDOSCOPY;  Service: Endoscopy;  Laterality: N/A;  .  ESOPHAGOGASTRODUODENOSCOPY N/A 07/30/2020   Procedure: ESOPHAGOGASTRODUODENOSCOPY (EGD);  Surgeon: Toledo, Boykin Nearingeodoro K, MD;  Location: [facility] ENDOSCOPY;  Service: Gastroenterology;  Laterality: N/A;  . GIVENS CAPSULE STUDY N/A 09/15/2020   Procedure: GIVENS CAPSULE STUDY;  Surgeon: Wyline MoodAnna, Kiran, MD;  [facility];  Service: Gastroenterology;  Laterality: N/A;  . HERNIA REPAIR    . JOINT REPLACEMENT    . TOTAL HIP ARTHROPLASTY    . VALVE REPLACEMENT  1/02   Aortic; echo 3/09 valve  working well; echo 10/11 working well; put on Coumadin   HPI: Patient is an 87 y.o. male who presents [facility] referred due to multiple choking episodes in the past month. PMHx CHF, aortic stenosis, HTN, CAD, venous stasis, cellulitis, GERD, large hiatal hernia (per 09/13/20 cxr).   No data recorded  Subjective Assessment - 04/11/21 1803    Subjective Daughter reports pt had aspiration PNA after choking episode recently    Currently in Pain? Yes    Pain Score --   FACES 6   Pain Location Sacrum    Pain Descriptors / Indicators Other (Comment);Discomfort   fidgeting           Assessment / Plan / Recommendation  CHL IP CLINICAL IMPRESSIONS 04/11/2021  Clinical Impression Patient presents with moderate pharyngeal and pharyngoesophageal dysphagia. Oral stage is grossly within functional limits, with adequate bolus control, rotary mastication, and timely anterior to posterior transfer. Swallow initiation occurs at the level of the valleculae, which is within functional limits given normative age-related changes in swallowing. Pharyngeal stage is characterized by adequate tongue base retraction, impaired pharyngeal constriction and reduced hyolaryngeal excursion which results in minimal epiglottic deflection and reduced duration of cricopharyngeus opening. This results in pharyngeal residue with all consistencies (solids >liquids). Mild-moderate valleculae and pyriform sinus residue with thin liquids, increasing with thickness of bolus, with moderate-severe retention of regular solid in the valleculae. Despite incomplete epiglottic deflection, patient had adequate airway protection, with no aspiration observed on this exam. There was transient laryngeal penetration of thin liquid with multiple swallows only. Cervical esophageal phase is noted for prominent cricopharyngeus, with reduced stripping of the bolus through the pharynx, primarily due to shortened duration of UES  opening. Patient has a history of GERD and large hiatal hernia which may also impact pharyngeal phase. Several compensatory strategies were attempted. Chin tuck was not effective in reducing residue when used during or after the swallow. The most effective techniques were using liquid wash after solids and using multiple effortful swallows to facilitate clearance of residue. After the study, SLP reviewed findings with patient and his daughter using imaging for teachback. Recommend mechanical soft, moistened solids (dysphagia 2 may be more appropriate if pt is not willing or able to cut foods), thin liquids, with use of the above precautions to reduce aspiration risk. Without use of liquid wash and clearing swallows, pt will be at increased risk for aspiration with buildup of residue. Discussed aspiration risks with pt and daughter, including quality of life considerations given pt's age. Encouraged him to continue working with speech therapist at his facility with use of strategies, and possibly pharyngeal exercises targeting hyolaryngeal excursion.  SLP Visit Diagnosis Dysphagia, pharyngeal phase (R13.13);Dysphagia, pharyngoesophageal phase (R13.14)  Attention and concentration deficit following --  Frontal lobe and executive function deficit following --  Impact on safety and function Mild aspiration risk;Moderate aspiration risk      CHL IP TREATMENT RECOMMENDATION 04/11/2021  Treatment Recommendations Defer treatment plan to f/u with SLP  Prognosis 04/11/2021  Prognosis for Safe Diet Advancement Fair  Barriers to Reach Goals --  Barriers/Prognosis Comment --    CHL IP DIET RECOMMENDATION 04/11/2021  SLP Diet Recommendations Dysphagia 3 (Mech soft) solids;Dysphagia 2 (Fine chop) solids;Thin liquid  Liquid Administration via Cup  Medication Administration Crushed with puree  Compensations Slow rate;Small sips/bites;Multiple dry swallows after each bite/sip;Follow solids with liquid  Postural  Changes Remain semi-upright after after feeds/meals (Comment);Seated upright at 90 degrees      CHL IP OTHER RECOMMENDATIONS 04/11/2021  Recommended Consults --  Oral Care Recommendations Oral care BID  Other Recommendations --      No flowsheet data found.    No flowsheet data found.         CHL IP ORAL PHASE 04/11/2021  Oral Phase WFL    CHL IP PHARYNGEAL PHASE 04/11/2021  Pharyngeal Phase Impaired  Pharyngeal- Nectar Cup Delayed swallow initiation-vallecula;Reduced pharyngeal peristalsis;Reduced epiglottic inversion;Reduced anterior laryngeal mobility;Reduced laryngeal elevation;Reduced airway/laryngeal closure;Pharyngeal residue - valleculae;Pharyngeal residue - pyriform  Pharyngeal Material does not enter airway  Pharyngeal- Thin Cup Delayed swallow initiation-vallecula;Reduced pharyngeal peristalsis;Reduced epiglottic inversion;Reduced anterior laryngeal mobility;Reduced laryngeal elevation;Reduced airway/laryngeal closure;Penetration/Aspiration during swallow;Pharyngeal residue - pyriform;Pharyngeal residue - valleculae  Pharyngeal Material does not enter airway;Material enters airway, remains ABOVE vocal cords then ejected out  Pharyngeal- Puree Delayed swallow initiation-vallecula;Reduced pharyngeal peristalsis;Reduced epiglottic inversion;Reduced anterior laryngeal mobility;Reduced laryngeal elevation;Reduced airway/laryngeal closure;Pharyngeal residue - valleculae;Pharyngeal residue - pyriform  Pharyngeal Material does not enter airway  Pharyngeal- Mechanical Soft Delayed swallow initiation-vallecula;Reduced pharyngeal peristalsis;Reduced epiglottic inversion;Reduced anterior laryngeal mobility;Reduced laryngeal elevation;Reduced airway/laryngeal closure;Pharyngeal residue - valleculae;Pharyngeal residue - pyriform;Pharyngeal residue - posterior pharnyx  Pharyngeal Material does not enter airway     CHL IP CERVICAL ESOPHAGEAL PHASE 04/11/2021  Cervical Esophageal Phase Impaired   Cervical Esophageal Comment prominent cricopharyngeus. reduced duration of opening   Rondel Baton, [place], CCC-SLP Speech-Language Pathologist   Arlana Lindau 04/11/2021, 6:16 PM      There were no vitals filed for this visit.    Dysphagia, unspecified type - Plan: DG SWALLOW FUNC OP MEDICARE SPEECH PATH, DG SWALLOW FUNC OP MEDICARE SPEECH PATH        Problem List Patient Active Problem List   Diagnosis Date Noted  . Fever 09/21/2020  . Acute on chronic respiratory failure with hypoxia (HCC) 09/21/2020  . History of lower GI bleeding 09/13/2020  . Symptomatic anemia 09/13/2020  . Malnutrition of moderate degree 08/08/2020  . Pressure injury of skin 08/02/2020  . Hemorrhagic shock (HCC) 07/30/2020  . Acute blood loss anemia 07/29/2020  . Lactic acidosis 07/29/2020  . AKI (acute kidney injury) (HCC) 07/11/2020  . Acute on chronic heart failure with preserved ejection fraction (HFpEF) (HCC)   . Permanent atrial fibrillation (HCC)   . Cellulitis 07/10/2020  . 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 Varicose veins of leg with swelling, right 02/08/2020  . 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 Greater trochanteric pain syndrome 11/02/2019  . Lower limb ulcer, ankle, left, limited to breakdown of skin (HCC) 11/02/2019  . H/O atrial flutter 07/15/2018  . Obstructive sleep apnea 10/23/2017  . Chronic diastolic CHF (congestive heart failure) (HCC) 09/24/2017  . Lymphedema 09/24/2017  . Chronic hyponatremia 08/12/2017  . Encounter for anticoagulation discussion and counseling 02/24/2017  . Bilateral leg edema 09/25/2015  . RLS (restless legs syndrome) 08/23/2015  . Upper GI bleeding 07/31/2015  . C. difficile colitis 05/26/2015  . Blood loss anemia   .  History of mechanical aortic valve replacement   . Anemia 04/02/2015  . GERD (gastroesophageal reflux disease)   . Bradycardia   . Depression   . Anxiety   .  Hypertension   . Decreased hearing   . Coronary artery disease   . Aortic stenosis   . S/P AVR   . Warfarin anticoagulation   . Carotid bruit     Arlana Lindau 04/11/2021, 6:15 PM  [place] [facility] DIAGNOSTIC RADIOLOGY [address] [place], [place], [address] Phone: [phone]   Fax:     Name: Zevin Nevares MRN: [id number] Date of Birth: [birth year]

## 2021-04-12 ENCOUNTER — Other Ambulatory Visit
Admission: RE | Admit: 2021-04-12 | Discharge: 2021-04-12 | Disposition: A | Discharge status: Home / Self Care | Payer: Medicare Other | Source: Ambulatory Visit | Attending: Internal Medicine | Admitting: Internal Medicine

## 2021-04-12 DIAGNOSIS — Z7901 Long term (current) use of anticoagulants: Secondary | ICD-10-CM | POA: Insufficient documentation

## 2021-04-12 DIAGNOSIS — I4891 Unspecified atrial fibrillation: Secondary | ICD-10-CM | POA: Insufficient documentation

## 2021-04-12 DIAGNOSIS — R110 Nausea: Secondary | ICD-10-CM | POA: Insufficient documentation

## 2021-04-12 LAB — PROTIME-INR
INR: 3.6 — ABNORMAL HIGH (ref 0.8–1.2)
Prothrombin Time: 35.6 seconds — ABNORMAL HIGH (ref 11.4–15.2)

## 2021-04-12 LAB — COMPREHENSIVE METABOLIC PANEL
ALT: 10 U/L (ref 0–44)
AST: 32 U/L (ref 15–41)
Albumin: 4.3 g/dL (ref 3.5–5.0)
Alkaline Phosphatase: 176 U/L — ABNORMAL HIGH (ref 38–126)
Anion gap: 15 (ref 5–15)
BUN: 117 mg/dL — ABNORMAL HIGH (ref 8–23)
CO2: 29 mmol/L (ref 22–32)
Calcium: 9.9 mg/dL (ref 8.9–10.3)
Chloride: 85 mmol/L — ABNORMAL LOW (ref 98–111)
Creatinine, Ser: 2.14 mg/dL — ABNORMAL HIGH (ref 0.61–1.24)
GFR, Estimated: 29 mL/min — ABNORMAL LOW (ref >60)
Glucose, Bld: 139 mg/dL — ABNORMAL HIGH (ref 70–99)
Potassium: 4.1 mmol/L (ref 3.5–5.1)
Sodium: 129 mmol/L — ABNORMAL LOW (ref 135–145)
Total Bilirubin: 0.8 mg/dL (ref 0.3–1.2)
Total Protein: 8.1 g/dL (ref 6.5–8.1)

## 2021-04-16 ENCOUNTER — Encounter: Payer: Medicare Other | Admitting: Internal Medicine

## 2021-04-16 ENCOUNTER — Other Ambulatory Visit: Payer: Self-pay

## 2021-04-16 DIAGNOSIS — I130 Hypertensive heart and chronic kidney disease with heart failure and stage 1 through stage 4 chronic kidney disease, or unspecified chronic kidney disease: Secondary | ICD-10-CM | POA: Diagnosis not present

## 2021-04-16 DIAGNOSIS — Z950 Presence of cardiac pacemaker: Secondary | ICD-10-CM | POA: Diagnosis not present

## 2021-04-16 DIAGNOSIS — L97812 Non-pressure chronic ulcer of other part of right lower leg with fat layer exposed: Secondary | ICD-10-CM | POA: Diagnosis not present

## 2021-04-16 DIAGNOSIS — L89152 Pressure ulcer of sacral region, stage 2: Secondary | ICD-10-CM | POA: Diagnosis not present

## 2021-04-16 DIAGNOSIS — I890 Lymphedema, not elsewhere classified: Secondary | ICD-10-CM | POA: Diagnosis not present

## 2021-04-16 DIAGNOSIS — I872 Venous insufficiency (chronic) (peripheral): Secondary | ICD-10-CM | POA: Diagnosis not present

## 2021-04-16 DIAGNOSIS — I2510 Atherosclerotic heart disease of native coronary artery without angina pectoris: Secondary | ICD-10-CM | POA: Diagnosis not present

## 2021-04-16 DIAGNOSIS — N1832 Chronic kidney disease, stage 3b: Secondary | ICD-10-CM | POA: Diagnosis not present

## 2021-04-16 DIAGNOSIS — M109 Gout, unspecified: Secondary | ICD-10-CM | POA: Diagnosis not present

## 2021-04-16 DIAGNOSIS — I5042 Chronic combined systolic (congestive) and diastolic (congestive) heart failure: Secondary | ICD-10-CM | POA: Diagnosis not present

## 2021-04-16 DIAGNOSIS — L97822 Non-pressure chronic ulcer of other part of left lower leg with fat layer exposed: Secondary | ICD-10-CM | POA: Diagnosis not present

## 2021-04-16 DIAGNOSIS — Z952 Presence of prosthetic heart valve: Secondary | ICD-10-CM | POA: Diagnosis not present

## 2021-04-16 DIAGNOSIS — G629 Polyneuropathy, unspecified: Secondary | ICD-10-CM | POA: Diagnosis not present

## 2021-04-16 DIAGNOSIS — Z7901 Long term (current) use of anticoagulants: Secondary | ICD-10-CM | POA: Diagnosis not present

## 2021-04-18 NOTE — Progress Notes (Signed)
[name], [name] ([id number]) Visit Report for 04/16/2021 HPI Details Patient Name: [name], [name] Date of Service: 04/16/2021 11:15 AM Medical Record Number: [id number] Patient Account Number: [id number] Date of Birth/Sex: September 28, 1931 (87 y.o. M) Treating RN: Primary Care Provider: [name] Other Clinician: Referring Provider: [name] Treating Provider/Extender: Altamese [place] in Treatment: 25 History of Present Illness HPI Description: 04/21/2019 ADMISSION This is an independent 87 year old man who lives in the independent part of [address] of [place]. He has a history of chronic lower extremity edema and wears compression stockings. He states about a month ago he was taking off the one on the right and pulled some skin off accidentally. He has had 2 small wounds on the right anterior and right medial lower extremity. I note looking through [facility] health link that he had multiple venous ultrasounds in 2015 and 16. These were DVT rule outs. He did have a Baker's cyst on the left. He has not not had a previous wound history although he did have a history of cellulitis in his legs. Past medical history; includes aortic stenosis status post mechanical AVR in 2007 on chronic Coumadin, lower extremity edema with a history of cellulitis, history of MRSA. ABIs in our clinic were 1.1 on the right 6/3; patient with predominantly venous insufficiency ulcers in the right lower leg probably some degree of lymphedema. He had to wounds last week. We put him in 3 layer compression. The nurses at [facility] are changing the dressing. The area laterally is healed but he still has a small very painful area on the anterior tibial area. He is on Coumadin because of mechanical aortic valve. 6/10; venous insufficiency ulcers on the right lower leg. He has some degree of lymphedema. We have been using 3 layer compression with silver collagen small wound. Dimensions are improved. I  gave him doxycycline last week which he tolerated because of surrounding erythema. This is improved also 6/17; arrives in clinic today with portable silver collagen dressing tightly adherent to the wound. Also felt that the wrap was too tight 3 layer being changed at the [facility] 6/24; patient's wound is small still adherent debris over the surface however even under illumination it was hard to see what was still open here. We have been using silver collagen 7/1; the patient arrives in the clinic today and the area on the right lower leg is healed. He has severe chronic venous insufficiency. He has 20/30 compression stockings. Readmission: 06/22/2020 on evaluation today patient presents for reevaluation here in our clinic although it has been a little over a year since we last saw him. He does have a history of lymphedema, venous insufficiency, anticoagulant therapy, he is on a pacemaker which is the reason for the anticoagulants, hypertension, and congestive heart failure. With that being said unfortunately he has been dealing with a wound of the left and right legs which has been giving him some trouble since arrival first 2021. That is in regard to the left leg ulcer. In regard to the right leg ulcer this is just a very small area that really I think will seal up quite nicely is more of the lymphedema opening than anything else. With that being said unfortunately the left leg is quite significant. We actually have noted that the patient's been dealing with this for quite some time and I think that there is a chance we may want to consider biopsy. With that being said he is on Coumadin therefore we  were not able to perform a biopsy at this point. I think that something however in the future that may be warranted we may just have to take him off of the current Coumadin regimen in order to do this. We discussed doing it today but I am more concerned about the fact that he could have  issues with uncontrolled bleeding and in that case we may have to send him to the ER. His daughter was not wanting to take that chance which I can completely understand. Nonetheless so far they have been using antibiotic ointment over the area I am just putting a protective dressing I do believe the patient is that he needs some type of compression. 07/07/2020 on evaluation today patient appears to be doing poorly in regard to his bilateral lower extremities today. He actually went to urgent care last night due to having pain in his legs due to the wraps. It appears they got somewhat tight on him because of the fact that he is having such significant swelling. He appears to be fluid overloaded I do not see any signs of infection actively at this point which is good news but unfortunately I think that this is something that may need to be addressed even by cardiology more so than just with a different or alternate type of wrap. I will contact his cardiologist, Dr. Mariah Milling, as well. 07/10/2020 upon evaluation today patient unfortunately appears to be doing worse even than when I saw him on Friday from the standpoint of where his legs stand. The main issue is that he has increased erythema although there is still not warm to touch I am concerned about infection and about this getting significantly worse. Again my initial concern was more fluid overload and I discussed this with his cardiology office on Friday. With that being said I feel like this has gotten worse his daughter is in agreement and I think that the best option would be to send him to the hospital at this point. READMISSION 09/06/2020 Is a patient who has had 2 stays in this clinic firstly in 2020 and more recently from 06/02/2020 through 07/10/2020. He was cared for by [facility]. When he was last here he had bilateral lower extremity ulcers in the setting of chronic venous insufficiency with lymphedema. When he we last saw him he had weeping  edema fluido Cellulitis and he was sent to the hospital. He was admitted initially from 06/30/2020 through 07/14/2020 with cellulitis plus stasis dermatitis. Treated empirically with vancomycin and had increases in his torsemide. He was then sent to the skilled level [facility] [place] [facility] ([id number]) of [place] but readmitted to hospital from 07/29/2020 through 08/10/2020 with blood loss anemia hemoglobin of 4 and OB positive stools. During this hospitalization he was noted to have a sacral wound at stage II. He was sent to [facility] [place] again in the skilled level. He had compression on his legs with Coban. He is now back at his independent living setting. He has not been wearing compression and his daughter feels there is already increasing swelling. To have stockings at home not exactly sure at what strength. Past medical history includes lymphedema, chronic venous insufficiency, pacemaker, congestive heart failure, hypertension, aortic valve stenosis status post mechanical valve replacement in 2002 on chronic Coumadin, chronic kidney disease stage IIIb, atrial fibrillation, peripheral neuropathy, large hiatal hernia. We did not test his ABIs in the clinic today. Readmission: 10/17/2020 on evaluation today patient appears to be doing somewhat  poorly in regard to his left leg and the fact that he does have an open wound at this point. He has not had the compression socks placed at the facility they have not been putting them on at all unfortunately. He does not have on the right leg currently and apparently there is significant put him on the left leg with the wound which I understand is that can be somewhat tight obviously. Nonetheless I do believe the right leg needs to have a compression sock and I do believe on the left side right now we can do something a little better in order to try to get the wound healed at this point. The patient is on torsemide and currently allopurinol for  gout. He also has daily weights due to congestive heart failure I am assuming in order to monitor his fluid status overall. He is currently a DNR at the facility. 10/30/2020 upon evaluation today patient appears to be doing somewhat poorly at this time in regard to his bilateral lower extremities. His left leg which is the only place where he had a wound last week when I saw him is actually doing worse with new wound openings. The right leg also has a small wound as well. However the most concerning thing is that last week he did not have any signs of infection or erythema there is no warmth to touch over the leg. This week the entire leg is more sore he also has erythema extending from the ankle to just below the knee with the area being erythematous and warm to touch which I believe represents cellulitis at minimum. I am concerned about how this is spread so quickly and the fact that he is having increased pain and also not feeling as well. All in all I discussed with the patient that unfortunately I feel like he may be best served going to the ER for further evaluation of this issue currently to try to see about the potential for IV antibiotics and to have appropriate lab work performed. His daughter is present during the visit today and she was included in the decision making at this point. 11/13/2020 upon evaluation today patient appears to be doing well with regard to his leg ulcer. Fortunately he does not appear to be showing signs of infection anything like it was previous. This is great news. He was sent home with IV antibiotic therapy that seems to have done extremely well for him. No fevers, chills, nausea, vomiting, or diarrhea. 11/27/2020 upon evaluation today patient appears to be doing quite well in regard to his wounds currently. Fortunately there is no signs of active infection at this time which is great news and I am very pleased in this regard. He did have some concerns or his  daughter did about an area on his gluteal region. With that being said I see nothing that is actually open at this point which is great news I do think continuing with a derma cloud is a good way to go. With regard to his leg he does have a new area on the right leg of open wound locations which is new compared to last visit we will need to address this as well. Otherwise he seems to be doing quite well in my opinion. 12/15/2020 upon evaluation today patient appears to be doing well with regard to his leg ulcer on the left. With that being said he did not have the Tubigrip on which I think is integral to getting  this closed as he continues to weep. Subsequently I think the Tubigrip will help control some of the edema which will help him in that regard. Fortunately there is no signs of active infection at this time. No fevers, chills, nausea, vomiting, or diarrhea. 01/01/2021 upon evaluation today patient appears to be doing well with regard to his wound. This is measuring better and actually is significantly smaller. Fortunately there is no signs of active infection at this time. No fevers, chills, nausea, vomiting, or diarrhea. 01/15/2021 on evaluation today patient appears to be doing well with regard to his left leg ulcer though he still continues to weep quite a bit he did not have the Tubigrip on the day that he supposed to. Fortunately there is no signs of active infection at this time. No fevers, chills, nausea, vomiting, or diarrhea. 01/29/2021 upon evaluation today patient appears to be doing well with regard to his right leg which is completely healed. His left leg though not healed is just a very small open area that is weeping. With regard to his gluteal region this is actually new and in fact he has a wound that is really in the sacral area. This is stated to be a stage II that may be the case there is really no slough buildup I [name] leave it as such for the time being. We will keep a close  eye on things however and see how it proceeds. 02/12/2021 upon evaluation today patient appears to be doing excellent in regard to his leg ulcers. In fact everything appears to be closed as best I can tell at this point. I think he is ready to transition into his compression stocking. With that being said his gluteal region still is open although it appears to be fairly clean I think the collagen is still a good thing to do here. We will see how things continue to improve over the next week or so. 02/26/2021 on evaluation today patient appears to be doing well with regard to his wound. He is tolerating the dressing changes without complication the wound is smaller though I feel like he is at the point where we need to try to do something to dry this up a little bit more I think switching from silver collagen to a silver alginate would be beneficial. 03/16/2021 upon evaluation today patient appears to be doing excellent in regard to his wound. He has been tolerating the dressing changes without complication. Fortunately there is no signs of active infection at this time. No fevers, chills, nausea, vomiting, or diarrhea. 04/02/2021 upon evaluation today patient appears to be doing well with regard to his wound. He really does not have anything terribly open but nonetheless he still has a lot of redness in the sacral area I think there is still a lot of pressure getting to this region. With that being said I think he really needs to have more appropriate offloading I think that may be the biggest issue he spends a lot of time sitting in his recliner from what I am hearing. 5/23; there has been some improvement in the sacral area not so much in the area on the left buttock. Both of these very tiny punched-out areas with surrounding erythema. They seem to be tender out of proportion to what you might expect looking at them. We have been using silver alginate on this area Electronic Signature(s) EYAN, HAGOOD ([id number]) Signed: 04/16/2021 3:42:13 PM By: Baltazar Najjar MD Entered By: Baltazar Najjar on  04/16/2021 11:54:51 [name], [name] ([id number]) -------------------------------------------------------------------------------- Physical Exam Details Patient Name: [name], [name] Date of Service: 04/16/2021 11:15 AM Medical Record Number: [id number] Patient Account Number: [id number] Date of Birth/Sex: [birth year] (87 y.o. M) Treating RN: Primary Care Provider: Marcelino DusterJohnston, John Other Clinician: Referring Provider: Marcelino DusterJohnston, John Treating Provider/Extender: Altamese CarolinaOBSON, Lillybeth Tal G Weeks in Treatment: 25 Constitutional Sitting or standing Blood Pressure is within target range for patient.. Pulse regular and within target range for patient.Marland Kitchen. Respirations regular, non- labored and within target range.. Temperature is normal and within the target range for the patient.Marland Kitchen. appears in no distress. Notes Wound exam; he has 2 small areas which are roughly the same one is on the lower coccyx the other in the left buttock in close proximity to the gluteal cleft. These are tiny raised erythematous areas with central small openings. They seem tender out of proportion to what one might expect. No clear surrounding cellulitis Electronic Signature(s) Signed: 04/16/2021 3:42:13 PM By: Baltazar Najjarobson, Jonanthan Bolender MD Entered By: Baltazar Najjarobson, Jae Skeet on 04/16/2021 11:56:51 [name], Farzad A. ([id number]) -------------------------------------------------------------------------------- Physician Orders Details Patient Name: [name], [name] Date of Service: 04/16/2021 11:15 AM Medical Record Number: [id number] Patient Account Number: [id number] Date of Birth/Sex: [birth year] (87 y.o. M) Treating RN: Huel CoventryWoody, [name] Primary Care Provider: Marcelino DusterJohnston, John Other Clinician: Referring Provider: Marcelino DusterJohnston, John Treating Provider/Extender: Altamese CarolinaOBSON, Kelvon Giannini G Weeks in Treatment: 7025 Verbal / Phone Orders: No Diagnosis Coding Follow-up  Appointments o Return Appointment in 2 weeks. Edema Control - Lymphedema / Segmental Compressive Device / Other Bilateral Lower Extremities o Patient to wear own compression stockings. Remove compression stockings every night before going to bed and put on every morning when getting up. - bilat lower leg o Elevate legs to the level of the heart and pump ankles as often as possible o Elevate leg(s) parallel to the floor when sitting. Off-Loading o Gel wheelchair cushion - keep gel cushion under him when out of bed at all times o Turn and reposition every 2 hours - keep off of sacral wound area/REPOSITION EVERY 2 HOURS FOR WOUND HEALIN Additional Orders / Instructions Wound #12 Sacrum o Other: - KEEP DRESSINGS ON BOTH SACRAL WOUNDS/FOLLOW WOUND CARE ORDERS Wound #13 Left Gluteus o Other: - KEEP DRESSINGS ON BOTH SACRAL WOUNDS/FOLLOW WOUND CARE ORDERS Wound Treatment Wound #12 - Sacrum Cleanser: Normal Saline (Generic) 3 x Per Week/30 Days Discharge Instructions: Wash your hands with soap and water. Remove old dressing, discard into plastic bag and place into trash. Cleanse the wound with Normal Saline prior to applying a clean dressing using gauze sponges, not tissues or cotton balls. Do not scrub or use excessive force. Pat dry using gauze sponges, not tissue or cotton balls. Primary Dressing: Silvercel Small 2x2 (in/in) (Generic) 3 x Per Week/30 Days Discharge Instructions: Apply Silvercel Small 2x2 (in/in) as instructed Secondary Dressing: Mepilex Border Flex, 4x4 (in/in) (Generic) 3 x Per Week/30 Days Discharge Instructions: Apply to wound as directed. Do not cut. Wound #13 - Gluteus Wound Laterality: Left Cleanser: Normal Saline (Generic) 3 x Per Week/30 Days Discharge Instructions: Wash your hands with soap and water. Remove old dressing, discard into plastic bag and place into trash. Cleanse the wound with Normal Saline prior to applying a clean dressing using gauze  sponges, not tissues or cotton balls. Do not scrub or use excessive force. Pat dry using gauze sponges, not tissue or cotton balls. Primary Dressing: Silvercel Small 2x2 (in/in) (Generic) 3 x Per Week/30 Days Discharge Instructions: Apply Silvercel Small 2x2 (in/in) as instructed Secondary  Dressing: Mepilex Border Flex, 4x4 (in/in) (Generic) 3 x Per Week/30 Days Discharge Instructions: Apply to wound as directed. Do not cut. Electronic Signature(s) Signed: 04/16/2021 3:42:13 PM By: Baltazar Najjar MD Signed: 04/17/2021 5:46:47 PM By: Elliot Gurney, BSN, RN, CWS, [name] RN, BSN Entered By: Elliot Gurney, BSN, RN, CWS, [name] on 04/16/2021 11:40:02 Joellyn Quails ([id number]) -------------------------------------------------------------------------------- Problem List Details Patient Name: LADARRELL, CORNWALL A. Date of Service: 04/16/2021 11:15 AM Medical Record Number: [id number] Patient Account Number: [id number] Date of Birth/Sex: [birth year] (87 y.o. 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 10/17/2020 No Yes failure I25.10 Atherosclerotic heart disease of native coronary artery without angina 10/17/2020 No Yes pectoris Inactive Problems ICD-10 Code Description Active Date Inactive Date L97.822 Non-pressure chronic ulcer of other part of left lower leg with fat layer exposed 10/17/2020 10/17/2020 L97.812 Non-pressure chronic ulcer of other part of right lower leg with fat layer  10/17/2020 10/17/2020 exposed I89.0 Lymphedema, not elsewhere classified 10/17/2020 10/17/2020 I87.2 Venous insufficiency (chronic) (peripheral) 10/17/2020 10/17/2020 Resolved Problems Electronic Signature(s) Signed: 04/16/2021 3:42:13 PM By: Baltazar Najjar MD Joellyn Quails ([id number]) Entered By: Baltazar Najjar on 04/16/2021 11:53:39 [name], [name] ([id number]) -------------------------------------------------------------------------------- Progress Note Details Patient Name: [name] Hilts A. Date of Service: 04/16/2021 11:15 AM Medical Record Number: [id number] Patient Account Number: [id number] Date of Birth/Sex: March 13, 1931 (87 y.o. M) Treating RN: Primary Care Provider: [name] Other Clinician: Referring Provider: [name] Treating Provider/Extender: Altamese [place] in Treatment: 25 Subjective History of Present Illness (HPI) 04/21/2019 ADMISSION This is an independent 87 year old man who lives in the independent part of [address] of [place]. He has a history of chronic lower extremity edema and wears compression stockings. He states about a month ago he was taking off the one on the right and pulled some skin off accidentally. He has had 2 small wounds on the right anterior and right medial lower extremity. I note looking through [facility] health link that he had multiple venous ultrasounds in 2015 and 16. These were DVT rule outs. He did have a Baker's cyst on the left. He has not not had a previous wound history although he did have a history of cellulitis in his legs. Past medical history; includes aortic stenosis status post mechanical AVR in 2007 on chronic Coumadin, lower extremity edema with a history of cellulitis, history of MRSA. ABIs in our clinic were 1.1 on the right 6/3; patient with predominantly venous insufficiency ulcers in the right lower leg probably some degree of lymphedema. He had to wounds last week. We put him in 3 layer compression.  The nurses at [facility] are changing the dressing. The area laterally is healed but he still has a small very painful area on the anterior tibial area. He is on Coumadin because of mechanical aortic valve. 6/10; venous insufficiency ulcers on the right lower leg. He has some degree of lymphedema. We have been using 3 layer compression with silver collagen small wound. Dimensions are improved. I gave him doxycycline last week which he tolerated because of surrounding erythema. This is improved also 6/17; arrives in clinic today  with portable silver collagen dressing tightly adherent to the wound. Also felt that the wrap was too tight 3 layer being changed at the [facility] 6/24; patient's wound is small still adherent debris over the surface however even under illumination it was hard to see what was still open here. We have been using silver collagen 7/1; the patient arrives in the clinic today and the area on the right lower leg is healed. He has severe chronic venous insufficiency. He has 20/30 compression stockings. Readmission: 06/22/2020 on evaluation today patient presents for reevaluation here in our clinic although it has been a little over a year since we last saw him. He does have a history of lymphedema, venous insufficiency, anticoagulant therapy, he is on a pacemaker which is the reason for the anticoagulants, hypertension, and congestive heart failure. With that being said unfortunately he has been dealing with a wound of the left and right legs which has been giving him some trouble since arrival first 2021. That is in regard to the left leg ulcer. In regard to the right leg ulcer this is just a very small area that really I think will seal up quite nicely is more of the lymphedema opening than anything else. With that being said unfortunately the left leg is quite significant. We actually have noted that the patient's been dealing with this for quite some time and  I think that there is a chance we may want to consider biopsy. With that being said he is on Coumadin therefore we were not able to perform a biopsy at this point. I think that something however in the future that may be warranted we may just have to take him off of the current Coumadin regimen in order to do this. We discussed doing it today but I am more concerned about the fact that he could have issues with uncontrolled bleeding and in that case we may have to send him to the ER. His daughter was not wanting to take that chance which I can completely understand. Nonetheless so far they have been using antibiotic ointment over the area I am just putting a protective dressing I do believe the patient is that he needs some type of compression. 07/07/2020 on evaluation today patient appears to be doing poorly in regard to his bilateral lower extremities today. He actually went to urgent care last night due to having pain in his legs due to the wraps. It appears they got somewhat tight on him because of the fact that he is having such significant swelling. He appears to be fluid overloaded I do not see any signs of infection actively at this point which is good news but unfortunately I think that this is something that may need to be addressed even by cardiology more so than just with a different or alternate type of wrap. I will contact his cardiologist, Dr. Mariah Milling, as well. 07/10/2020 upon evaluation today patient unfortunately appears to be doing worse even than when I saw him on Friday from the standpoint of where his legs stand. The main issue is that he has increased erythema although there is still not warm to touch I am concerned about infection and about this getting significantly worse. Again my initial concern was more fluid overload and I discussed this with his cardiology office on Friday. With that being said I feel like this has gotten worse his daughter is in agreement and I think that  the best option would be to send  him to the hospital at this point. READMISSION 09/06/2020 Is a patient who has had 2 stays in this clinic firstly in 2020 and more recently from 06/02/2020 through 07/10/2020. He was cared for by [facility]. When he was last here he had bilateral lower extremity ulcers in the setting of chronic venous insufficiency with lymphedema. When he we last saw him he had weeping edema fluido Cellulitis and he was sent to the hospital. He was admitted initially from 06/30/2020 through 07/14/2020 with cellulitis plus stasis dermatitis. Treated empirically with vancomycin and had increases in his torsemide. He was then sent to the skilled level of [facility] but readmitted to hospital from 07/29/2020 through 08/10/2020 with blood loss anemia hemoglobin of 4 and OB positive stools. During this hospitalization he was noted to have a sacral wound at stage II. He was sent to [facility] again in the skilled level. He had compression on his legs with Coban. He is now back at his independent living setting. He has not been wearing compression and his daughter feels there is already increasing swelling. To have stockings at home not exactly sure at what strength. [name], [name] ([id number]) Past medical history includes lymphedema, chronic venous insufficiency, pacemaker, congestive heart failure, hypertension, aortic valve stenosis status post mechanical valve replacement in 2002 on chronic Coumadin, chronic kidney disease stage IIIb, atrial fibrillation, peripheral neuropathy, large hiatal hernia. We did not test his ABIs in the clinic today. Readmission: 10/17/2020 on evaluation today patient appears to be doing somewhat poorly in regard to his left leg and the fact that he does have an open wound at this point. He has not had the compression socks placed at the facility they have not been putting them on at all unfortunately. He does not have on the right leg  currently and apparently there is significant put him on the left leg with the wound which I understand is that can be somewhat tight obviously. Nonetheless I do believe the right leg needs to have a compression sock and I do believe on the left side right now we can do something a little better in order to try to get the wound healed at this point. The patient is on torsemide and currently allopurinol for gout. He also has daily weights due to congestive heart failure I am assuming in order to monitor his fluid status overall. He is currently a DNR at the facility. 10/30/2020 upon evaluation today patient appears to be doing somewhat poorly at this time in regard to his bilateral lower extremities. His left leg which is the only place where he had a wound last week when I saw him is actually doing worse with new wound openings. The right leg also has a small wound as well. However the most concerning thing is that last week he did not have any signs of infection or erythema there is no warmth to touch over the leg. This week the entire leg is more sore he also has erythema extending from the ankle to just below the knee with the area being erythematous and warm to touch which I believe represents cellulitis at minimum. I am concerned about how this is spread so quickly and the fact that he is having increased pain and also not feeling as well. All in all I discussed with the patient that unfortunately I feel like he may be best served going to the ER for further evaluation of this issue currently to try to see about  the potential for IV antibiotics and to have appropriate lab work performed. His daughter is present during the visit today and she was included in the decision making at this point. 11/13/2020 upon evaluation today patient appears to be doing well with regard to his leg ulcer. Fortunately he does not appear to be showing signs of infection anything like it was previous. This is great news.  He was sent home with IV antibiotic therapy that seems to have done extremely well for him. No fevers, chills, nausea, vomiting, or diarrhea. 11/27/2020 upon evaluation today patient appears to be doing quite well in regard to his wounds currently. Fortunately there is no signs of active infection at this time which is great news and I am very pleased in this regard. He did have some concerns or his daughter did about an area on his gluteal region. With that being said I see nothing that is actually open at this point which is great news I do think continuing with a derma cloud is a good way to go. With regard to his leg he does have a new area on the right leg of open wound locations which is new compared to last visit we will need to address this as well. Otherwise he seems to be doing quite well in my opinion. 12/15/2020 upon evaluation today patient appears to be doing well with regard to his leg ulcer on the left. With that being said he did not have the Tubigrip on which I think is integral to getting this closed as he continues to weep. Subsequently I think the Tubigrip will help control some of the edema which will help him in that regard. Fortunately there is no signs of active infection at this time. No fevers, chills, nausea, vomiting, or diarrhea. 01/01/2021 upon evaluation today patient appears to be doing well with regard to his wound. This is measuring better and actually is significantly smaller. Fortunately there is no signs of active infection at this time. No fevers, chills, nausea, vomiting, or diarrhea. 01/15/2021 on evaluation today patient appears to be doing well with regard to his left leg ulcer though he still continues to weep quite a bit he did not have the Tubigrip on the day that he supposed to. Fortunately there is no signs of active infection at this time. No fevers, chills, nausea, vomiting, or diarrhea. 01/29/2021 upon evaluation today patient appears to be doing well with  regard to his right leg which is completely healed. His left leg though not healed is just a very small open area that is weeping. With regard to his gluteal region this is actually new and in fact he has a wound that is really in the sacral area. This is stated to be a stage II that may be the case there is really no slough buildup I [name] leave it as such for the time being. We will keep a close eye on things however and see how it proceeds. 02/12/2021 upon evaluation today patient appears to be doing excellent in regard to his leg ulcers. In fact everything appears to be closed as best I can tell at this point. I think he is ready to transition into his compression stocking. With that being said his gluteal region still is open although it appears to be fairly clean I think the collagen is still a good thing to do here. We will see how things continue to improve over the next week or so. 02/26/2021 on evaluation today patient appears  to be doing well with regard to his wound. He is tolerating the dressing changes without complication the wound is smaller though I feel like he is at the point where we need to try to do something to dry this up a little bit more I think switching from silver collagen to a silver alginate would be beneficial. 03/16/2021 upon evaluation today patient appears to be doing excellent in regard to his wound. He has been tolerating the dressing changes without complication. Fortunately there is no signs of active infection at this time. No fevers, chills, nausea, vomiting, or diarrhea. 04/02/2021 upon evaluation today patient appears to be doing well with regard to his wound. He really does not have anything terribly open but nonetheless he still has a lot of redness in the sacral area I think there is still a lot of pressure getting to this region. With that being said I think he really needs to have more appropriate offloading I think that may be the biggest issue he spends a  lot of time sitting in his recliner from what I am hearing. 5/23; there has been some improvement in the sacral area not so much in the area on the left buttock. Both of these very tiny punched-out areas with surrounding erythema. They seem to be tender out of proportion to what you might expect looking at them. We have been using silver alginate on this area [facility], Kell A. ([id number]) Objective Constitutional Sitting or standing Blood Pressure is within target range for patient.. Pulse regular and within target range for patient.Marland Kitchen Respirations regular, non- labored and within target range.. Temperature is normal and within the target range for the patient.Marland Kitchen appears in no distress. Vitals Time Taken: 11:25 AM, Temperature: 97.7 F, Pulse: 66 bpm, Respiratory Rate: 18 breaths/min, Blood Pressure: 122/75 mmHg. General Notes: Wound exam; he has 2 small areas which are roughly the same one is on the lower coccyx the other in the left buttock in close proximity to the gluteal cleft. These are tiny raised erythematous areas with central small openings. They seem tender out of proportion to what one might expect. No clear surrounding cellulitis Integumentary (Hair, Skin) Wound #12 status is Open. Original cause of wound was Pressure Injury. The date acquired was: 01/18/2021. The wound has been in treatment 11 weeks. The wound is located on the Sacrum. The wound measures 0.2cm length x 0.1cm width x 0.1cm depth; 0.016cm^2 area and 0.002cm^3 volume. There is Fat Layer (Subcutaneous Tissue) exposed. There is no tunneling or undermining noted. There is a small amount of serous drainage noted. The wound margin is flat and intact. There is large (67-100%) red, pink granulation within the wound bed. There is a small (1-33%) amount of necrotic tissue within the wound bed. Wound #13 status is Open. Original cause of wound was Shear/Friction. The date acquired was: 03/31/2021. The wound has been in treatment  2 weeks. The wound is located on the Left Gluteus. The wound measures 0.3cm length x 0.2cm width x 0.1cm depth; 0.047cm^2 area and 0.005cm^3 volume. There is Fat Layer (Subcutaneous Tissue) exposed. There is no tunneling or undermining noted. There is a medium amount of serous drainage noted. The wound margin is flat and intact. There is small (1-33%) granulation within the wound bed. There is a large (67- 100%) amount of necrotic tissue within the wound bed including Adherent Slough. 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 Edema Control - Lymphedema / Segmental Compressive Device / Other: Patient to wear own compression stockings. Remove compression stockings every night before going to bed and put on every morning when getting up. - bilat lower leg Elevate legs to the level of the heart and pump ankles as often as possible Elevate leg(s) parallel to the floor when sitting. Off-Loading: Gel wheelchair cushion - keep gel cushion under him when out of bed at all times Turn and reposition every 2 hours - keep off of sacral wound area/REPOSITION EVERY 2 HOURS FOR WOUND HEALIN Additional Orders / Instructions: Wound #12 Sacrum: Other: - KEEP DRESSINGS ON BOTH SACRAL WOUNDS/FOLLOW WOUND CARE ORDERS Wound #13 Left Gluteus: Other: - KEEP DRESSINGS ON BOTH SACRAL WOUNDS/FOLLOW WOUND CARE ORDERS WOUND #12: - Sacrum Wound Laterality: Cleanser: Normal Saline (Generic) 3 x Per Week/30 Days Discharge Instructions: Wash your hands with soap and water. Remove old dressing, discard into plastic bag and place into trash. Cleanse the wound with  Normal Saline prior to applying a clean dressing using gauze sponges, not tissues or cotton balls. Do not scrub or use excessive force. Pat dry using gauze sponges, not tissue or cotton balls. Primary Dressing: Silvercel Small 2x2 (in/in) (Generic) 3 x Per Week/30 Days Discharge Instructions: Apply Silvercel Small 2x2 (in/in) as instructed Secondary Dressing: Mepilex Border Flex, 4x4 (in/in) (Generic) 3 x Per Week/30 Days [name], [name] ([id number]) Discharge Instructions: Apply to wound as directed. Do not cut. WOUND #13: - Gluteus Wound Laterality: Left Cleanser: Normal Saline (Generic) 3 x Per Week/30 Days Discharge Instructions: Wash your hands with soap and water. Remove old dressing, discard into plastic bag and place into trash. Cleanse the wound with Normal Saline prior to applying a clean dressing using gauze sponges, not tissues or cotton balls. Do not scrub or use excessive force. Pat dry using gauze sponges, not tissue or cotton balls. Primary Dressing: Silvercel Small 2x2 (in/in) (Generic) 3 x Per Week/30 Days Discharge Instructions: Apply Silvercel Small 2x2 (in/in) as instructed Secondary Dressing: Mepilex Border Flex, 4x4 (in/in) (Generic) 3 x Per Week/30 Days Discharge Instructions: Apply to wound as directed. Do not cut. 1. I did not change the primary dressing which is silver alginate 2. We are covering this with a border foam 3. Not exactly sure how much they are offloading this at the skilled facility [facility]] 4. I also wondered looking at these as if they had initially a dermatologic basis however not sure what this would be. They seem inordinately tender Electronic Signature(s) Signed: 04/16/2021 3:42:13 PM By: Baltazar Najjar MD Entered By: Baltazar Najjar on 04/16/2021 11:58:13 [name] Hilts A. ([id number]) -------------------------------------------------------------------------------- SuperBill Details Patient Name: [name] Hilts A. Date of  Service: 04/16/2021 Medical Record Number: [id number] Patient Account Number: [id number] Date of Birth/Sex: 12/30/1930 (87 y.o. M) Treating RN: Huel Coventry Primary Care Provider: [name] Other Clinician: Referring Provider: [name] Treating Provider/Extender: Altamese [place] in Treatment: 25 Diagnosis Coding ICD-10 Codes Code Description I89.0 Lymphedema, not elsewhere classified I87.2 Venous insufficiency (chronic) (peripheral) L97.822 Non-pressure chronic ulcer of other part of left lower leg with fat layer exposed L97.812 Non-pressure chronic ulcer of other part of right lower leg with fat layer exposed L89.152 Pressure ulcer of sacral region, stage 2 Z79.01 Long term (current) use of anticoagulants Z95.0 Presence of cardiac pacemaker I10  Essential (primary) hypertension I50.42 Chronic combined systolic (congestive) and diastolic (congestive) heart failure I25.10 Atherosclerotic heart disease of native coronary artery without angina pectoris Facility Procedures CPT4 Code: [id number] Description: 99213 - WOUND CARE VISIT-LEV 3 EST PT Modifier: Quantity: 1 Physician Procedures CPT4 Code: 4782956 Description: 99213 - WC PHYS LEVEL 3 - EST PT Modifier: Quantity: 1 CPT4 Code: Description: ICD-10 Diagnosis Description L89.152 Pressure ulcer of sacral region, stage 2 Modifier: Quantity: Electronic Signature(s) Signed: 04/16/2021 3:42:13 PM By: Baltazar Najjar MD Entered By: Baltazar Najjar on 04/16/2021 11:58:31

## 2021-04-18 NOTE — Progress Notes (Signed)
MICHAEL, WALRATH ([id number]) Visit Report for 04/16/2021 Arrival Information Details Patient Name: [name], [name] Date of Service: 04/16/2021 11:15 AM Medical Record Number: [id number] Patient Account Number: [id number] Date of Birth/Sex: 01/25/1931 (87 y.o. M) Treating RN: Yevonne Pax Primary Care Ahni Bradwell: [name] Other Clinician: Referring Yosmar Ryker: [name] Treating Mishika Flippen/Extender: Altamese [place] in Treatment: 25 Visit Information History Since Last Visit All ordered tests and consults were completed: No Patient Arrived: Wheel Chair Added or deleted any medications: No Arrival Time: 11:23 Any new allergies or adverse reactions: No Accompanied By: daughter Had a fall or experienced change in No Transfer Assistance: None activities of daily living that may affect Patient Identification Verified: Yes risk of falls: Secondary Verification Process Completed: Yes Signs or symptoms of abuse/neglect since last visito No Patient Requires Transmission-Based Precautions: No Hospitalized since last visit: No Patient Has Alerts: No Implantable device outside of the clinic excluding No cellular tissue based products placed in the center since last visit: Has Dressing in Place as Prescribed: Yes Pain Present Now: No Electronic Signature(s) Signed: 04/16/2021 2:18:56 PM By: Yevonne Pax RN Entered By: Yevonne Pax on 04/16/2021 11:24:40 [name], [name] ([id number]) -------------------------------------------------------------------------------- Clinic Level of Care Assessment Details Patient Name: [name] Hilts A. Date of Service: 04/16/2021 11:15 AM Medical Record Number: [id number] Patient Account Number: [id number] Date of Birth/Sex: 12-01-1930 (87 y.o. M) Treating RN: Huel Coventry Primary Care Carel Carrier: [name] Other Clinician: Referring Samaad Hashem: [name] Treating Sarajane Fambrough/Extender: Altamese [place] in Treatment: 25 Clinic Level of  Care Assessment Items TOOL 4 Quantity Score []  - Use when only an EandM is performed on FOLLOW-UP visit 0 ASSESSMENTS - Nursing Assessment / Reassessment X - Reassessment of Co-morbidities (includes updates in patient status) 1 10 X- 1 5 Reassessment of Adherence to Treatment Plan ASSESSMENTS - Wound and Skin Assessment / Reassessment []  - Simple Wound Assessment / Reassessment - one wound 0 X- 2 5 Complex Wound Assessment / Reassessment - multiple wounds []  - 0 Dermatologic / Skin Assessment (not related to wound area) ASSESSMENTS - Focused Assessment []  - Circumferential Edema Measurements - multi extremities 0 []  - 0 Nutritional Assessment / Counseling / Intervention []  - 0 Lower Extremity Assessment (monofilament, tuning fork, pulses) []  - 0 Peripheral Arterial Disease Assessment (using hand held doppler) ASSESSMENTS - Ostomy and/or Continence Assessment and Care []  - Incontinence Assessment and Management 0 []  - 0 Ostomy Care Assessment and Management (repouching, etc.) PROCESS - Coordination of Care X - Simple Patient / Family Education for ongoing care 1 15 []  - 0 Complex (extensive) Patient / Family Education for ongoing care []  - 0 Staff obtains , Records, Test Results / Process Orders []  - 0 Staff telephones HHA, Nursing Homes / Clarify orders / etc []  - 0 Routine Transfer to another Facility (non-emergent condition) []  - 0 Routine Hospital Admission (non-emergent condition) []  - 0 New Admissions / / Ordering NPWT, Apligraf, etc. []  - 0 Emergency Hospital Admission (emergent condition) X- 1 10 Simple Discharge Coordination []  - 0 Complex (extensive) Discharge Coordination PROCESS - Special Needs []  - Pediatric / Minor Patient Management 0 []  - 0 Isolation Patient Management []  - 0 Hearing / Language / Visual special needs []  - 0 Assessment of Community assistance (transportation, D/C planning, etc.) []  - 0 Additional  assistance / Altered mentation []  - 0 Support Surface(s) Assessment (bed, cushion, seat, etc.) INTERVENTIONS - Wound Cleansing / Measurement [name], [name] ( ) []  - 0 Simple Wound Cleansing -  one wound X- 2 5 Complex Wound Cleansing - multiple wounds X- 1 5 Wound Imaging (photographs - any number of wounds) []  - 0 Wound Tracing (instead of photographs) []  - 0 Simple Wound Measurement - one wound X- 2 5 Complex Wound Measurement - multiple wounds INTERVENTIONS - Wound Dressings []  - Small Wound Dressing one or multiple wounds 0 X- 1 15 Medium Wound Dressing one or multiple wounds []  - 0 Large Wound Dressing one or multiple wounds []  - 0 Application of Medications - topical []  - 0 Application of Medications - injection INTERVENTIONS - Miscellaneous []  - External ear exam 0 []  - 0 Specimen Collection (cultures, biopsies, blood, body fluids, etc.) []  - 0 Specimen(s) / Culture(s) sent or taken to Lab for analysis []  - 0 Patient Transfer (multiple staff / / Similar devices) []  - 0 Simple Staple / Suture removal (25 or less) []  - 0 Complex Staple / Suture removal (26 or more) []  - 0 Hypo / Hyperglycemic Management (close monitor of Blood Glucose) []  - 0 Ankle / Brachial Index (ABI) - do not check if billed separately X- 1 5 Vital Signs Has the patient been seen at the hospital within the last three years: Yes Total Score: 95 Level Of Care: New/Established - Level 3 Electronic Signature(s) Signed: 04/17/2021 5:46:47 PM By: , BSN, RN, CWS, [name] RN, BSN Entered By: , BSN, RN, CWS, [name] on 04/16/2021 11:40:44 ( ) -------------------------------------------------------------------------------- Encounter Discharge Information Details Patient Name: A. Date of Service: 04/16/2021 11:15 AM Medical Record Number: Patient Account Number: Nurse, adult Date of Birth/Sex: 03/23/1931 (87 y.o. M) Treating  RN: Primary Care Cicily Bonano: Other Clinician: Referring Randell Teare: 04/19/2021 Treating Rylan Bernard/Extender: Elliot Gurney in Treatment: 25 Encounter Discharge Information Items Discharge Condition: Stable Ambulatory Status: Wheelchair Discharge Destination: Skilled Nursing Facility Orders Sent: Yes Transportation: Private Auto Accompanied By: daughter Schedule Follow-up Appointment: No Clinical Summary of Care: Electronic Signature(s) Signed: 04/17/2021 5:46:47 PM By: Joellyn Quails, BSN, RN, CWS, [name] RN, BSN Entered By: [id number], BSN, RN, CWS, [name] on 04/16/2021 11:43:44 04/18/2021 ([id number]) -------------------------------------------------------------------------------- Lower Extremity Assessment Details Patient Name: [name], [name] Date of Service: 04/16/2021 11:15 AM Medical Record Number: [id number] Patient Account Number: Huel Coventry Date of Birth/Sex: 02-11-1931 (87 y.o. M) Treating RN: Altamese [place] Primary Care Georgie Haque: 26 Other Clinician: Referring Maleeyah Mccaughey: 04/19/2021 Treating Singleton Hickox/Extender: Elliot Gurney in Treatment: 25 Electronic Signature(s) Signed: 04/16/2021 2:18:56 PM By: 04/18/2021 RN Entered By: Joellyn Quails on 04/16/2021 11:28:19 Yerger, Jeremaih A. ([name] Hilts) -------------------------------------------------------------------------------- Multi Wound Chart Details Patient Name: 04/18/2021 A. Date of Service: 04/16/2021 11:15 AM Medical Record Number: [id number] Patient Account Number: 09/14/1931 Date of Birth/Sex: [birth year] (87 y.o. M) Treating RN: [name] Primary Care Yanett Conkright: [name] Other Clinician: Referring Odessa Morren: Altamese [place] Treating Jael Kostick/Extender: 04/18/2021 in Treatment: 25 Vital Signs Height(in): Pulse(bpm): 66 Weight(lbs): Blood Pressure(mmHg): 122/75 Body Mass Index(BMI): Temperature(F): 97.7 Respiratory Rate(breaths/min): 18 Photos:  [N/A:N/A] Wound Location: Sacrum Left Gluteus N/A Wounding Event: Pressure Injury Shear/Friction N/A Primary Etiology: Pressure Ulcer Pressure Ulcer N/A Comorbid History: Cataracts, Sleep Apnea, Congestive Cataracts, Sleep Apnea, Congestive N/A Heart Failure, Coronary Artery Heart Failure, Coronary Artery Disease, Hypertension, Disease, Hypertension, Osteoarthritis Osteoarthritis Date Acquired: 01/18/2021 03/31/2021 N/A Weeks of Treatment: 11 2 N/A Wound Status: Open Open N/A Measurements L x W x D (cm) 0.2x0.1x0.1 0.3x0.2x0.1 N/A Area (cm) : 0.016 0.047 N/A Volume (cm) : 0.002 0.005 N/A %  Reduction in Area: 95.20% 33.80% N/A % Reduction in Volume: 98.00% 28.60% N/A Classification: Category/Stage II Category/Stage II N/A Exudate Amount: Small Medium N/A Exudate Type: Serous Serous N/A Exudate Color: amber amber N/A Wound Margin: Flat and Intact Flat and Intact N/A Granulation Amount: Large (67-100%) Small (1-33%) N/A Granulation Quality: Red, Pink N/A N/A Necrotic Amount: Small (1-33%) Large (67-100%) N/A Exposed Structures: Fat Layer (Subcutaneous Tissue): Fat Layer (Subcutaneous Tissue): N/A Yes Yes Fascia: No Fascia: No Tendon: No Tendon: No Muscle: No Muscle: No Joint: No Joint: No Bone: No Bone: No Epithelialization: None None N/A Treatment Notes Wound #12 (Sacrum) Cleanser Normal Saline Discharge Instruction: Wash your hands with soap and water. Remove old dressing, discard into plastic bag and place into trash. Cleanse the wound with Normal Saline prior to applying a clean dressing using gauze sponges, not tissues or cotton balls. Do not scrub or use excessive force. Pat dry using gauze sponges, not tissue or cotton balls. [name], [name] ([id number]) Peri-Wound Care Topical Primary Dressing Silvercel Small 2x2 (in/in) Discharge Instruction: Apply Silvercel Small 2x2 (in/in) as instructed Secondary Dressing Mepilex Border Flex, 4x4 (in/in) Discharge  Instruction: Apply to wound as directed. Do not cut. Secured With Compression Wrap Compression Stockings Add-Ons Wound #13 (Gluteus) Wound Laterality: Left Cleanser Normal Saline Discharge Instruction: Wash your hands with soap and water. Remove old dressing, discard into plastic bag and place into trash. Cleanse the wound with Normal Saline prior to applying a clean dressing using gauze sponges, not tissues or cotton balls. Do not scrub or use excessive force. Pat dry using gauze sponges, not tissue or cotton balls. Peri-Wound Care Topical Primary Dressing Silvercel Small 2x2 (in/in) Discharge Instruction: Apply Silvercel Small 2x2 (in/in) as instructed Secondary Dressing Mepilex Border Flex, 4x4 (in/in) Discharge Instruction: Apply to wound as directed. Do not cut. Secured With Compression Wrap Compression Stockings Facilities manager) Signed: 04/16/2021 3:42:13 PM By: Baltazar Najjar MD Entered By: Baltazar Najjar on 04/16/2021 11:53:53 [name] Hilts A. ([id number]) -------------------------------------------------------------------------------- Multi-Disciplinary Care Plan Details Patient Name: [name] Hilts A. Date of Service: 04/16/2021 11:15 AM Medical Record Number: [id number] Patient Account Number: [id number] Date of Birth/Sex: [birth year] (87 y.o. M) Treating RN: Huel Coventry Primary Care Raylie Maddison: [name] Other Clinician: Referring Xzavion Doswell: [name] Treating Jaray Boliver/Extender: Altamese [place] in Treatment: 25 Active Inactive Pressure Nursing Diagnoses: Knowledge deficit related to management of pressures ulcers Potential for impaired tissue integrity related to pressure, friction, moisture, and shear Goals: Patient will remain free from development of additional pressure ulcers Date Initiated: 04/16/2021 Target Resolution Date: 04/27/2021 Goal Status: Active Interventions: Assess: immobility, friction, shearing, incontinence upon  admission and as needed Notes: Electronic Signature(s) Signed: 04/17/2021 5:46:47 PM By: Elliot Gurney, BSN, RN, CWS, [name] RN, BSN Entered By: Elliot Gurney, BSN, RN, CWS, [name] on 04/16/2021 11:38:01 [name] Hilts A. ([id number]) -------------------------------------------------------------------------------- Pain Assessment Details Patient Name: [name] Hilts A. Date of Service: 04/16/2021 11:15 AM Medical Record Number: [id number] Patient Account Number: [id number] Date of Birth/Sex: 08/21/1931 (87 y.o. M) Treating RN: Yevonne Pax Primary Care Anthonia Monger: [name] Other Clinician: Referring Engelbert Sevin: [name] Treating Yaritsa Savarino/Extender: Altamese [place] in Treatment: 25 Active Problems Location of Pain Severity and Description of Pain Patient Has Paino No Site Locations Pain Management and Medication Current Pain Management: Electronic Signature(s) Signed: 04/16/2021 2:18:56 PM By: Yevonne Pax RN Entered By: Yevonne Pax on 04/16/2021 11:26:18 [name] Hilts A. ([id number]) -------------------------------------------------------------------------------- Patient/Caregiver Education Details Patient Name: [name] Hilts A. Date of Service: 04/16/2021 11:15 AM Medical Record Number: [id number]  Patient Account Number: [id number] Date of Birth/Gender: Apr 18, 1931 (87 y.o. M) Treating RN: Huel CoventryWoody, [name] Primary Care Physician: Marcelino DusterJohnston, John Other Clinician: Referring Physician: Marcelino DusterJohnston, John Treating Physician/Extender: Altamese CarolinaOBSON, MICHAEL G Weeks in Treatment: 25 Education Assessment Education Provided To: Patient Education Topics Provided Wound/Skin Impairment: Handouts: Caring for Your Ulcer Methods: Demonstration, Explain/Verbal Responses: State content correctly Electronic Signature(s) Signed: 04/17/2021 5:46:47 PM By: Elliot GurneyWoody, BSN, RN, CWS, [name] RN, BSN Entered By: Elliot GurneyWoody, BSN, RN, CWS, [name] on 04/16/2021 11:41:06 [name], [name]  ([id number]) -------------------------------------------------------------------------------- Wound Assessment Details Patient Name: [name], [name] Date of Service: 04/16/2021 11:15 AM Medical Record Number: [id number] Patient Account Number: [id number] Date of Birth/Sex: Apr 18, 1931 (87 y.o. M) Treating RN: Yevonne PaxEpps, Carrie Primary Care Leyan Branden: Marcelino DusterJohnston, John Other Clinician: Referring Damia Bobrowski: Marcelino DusterJohnston, John Treating Hansford Hirt/Extender: Altamese CarolinaOBSON, MICHAEL G Weeks in Treatment: 25 Wound Status Wound Number: 12 Primary Pressure Ulcer Etiology: Wound Location: Sacrum Wound Open Wounding Event: Pressure Injury Status: Date Acquired: 01/18/2021 Comorbid Cataracts, Sleep Apnea, Congestive Heart Failure, Weeks Of Treatment: 11 History: Coronary Artery Disease, Hypertension, Osteoarthritis Clustered Wound: No Photos Wound Measurements Length: (cm) 0.2 Width: (cm) 0.1 Depth: (cm) 0.1 Area: (cm) 0.016 Volume: (cm) 0.002 % Reduction in Area: 95.2% % Reduction in Volume: 98% Epithelialization: None Tunneling: No Undermining: No Wound Description Classification: Category/Stage II Wound Margin: Flat and Intact Exudate Amount: Small Exudate Type: Serous Exudate Color: amber Foul Odor After Cleansing: No Slough/Fibrino Yes Wound Bed Granulation Amount: Large (67-100%) Exposed Structure Granulation Quality: Red, Pink Fascia Exposed: No Necrotic Amount: Small (1-33%) Fat Layer (Subcutaneous Tissue) Exposed: Yes Tendon Exposed: No Muscle Exposed: No Joint Exposed: No Bone Exposed: No Treatment Notes Wound #12 (Sacrum) Cleanser Normal Saline Discharge Instruction: Wash your hands with soap and water. Remove old dressing, discard into plastic bag and place into trash. Cleanse the wound with Normal Saline prior to applying a clean dressing using gauze sponges, not tissues or cotton balls. Do not scrub or use excessive force. Pat dry using gauze sponges, not tissue or cotton  balls. [name], [name] ([id number]) Peri-Wound Care Topical Primary Dressing Silvercel Small 2x2 (in/in) Discharge Instruction: Apply Silvercel Small 2x2 (in/in) as instructed Secondary Dressing Mepilex Border Flex, 4x4 (in/in) Discharge Instruction: Apply to wound as directed. Do not cut. Secured With Compression Wrap Compression Stockings Add-Ons Electronic Signature(s) Signed: 04/16/2021 2:18:56 PM By: Yevonne PaxEpps, Carrie RN Entered By: Yevonne PaxEpps, Carrie on 04/16/2021 11:27:42 [name], [name] ([id number]) -------------------------------------------------------------------------------- Wound Assessment Details Patient Name: [name], [name] A. Date of Service: 04/16/2021 11:15 AM Medical Record Number: [id number] Patient Account Number: [id number] Date of Birth/Sex: Apr 18, 1931 (87 y.o. M) Treating RN: Yevonne PaxEpps, Carrie Primary Care Crosby Oriordan: Marcelino DusterJohnston, John Other Clinician: Referring Jade Burkard: Marcelino DusterJohnston, John Treating Johnette Teigen/Extender: Altamese CarolinaOBSON, MICHAEL G Weeks in Treatment: 25 Wound Status Wound Number: 13 Primary Pressure Ulcer Etiology: Wound Location: Left Gluteus Wound Open Wounding Event: Shear/Friction Status: Date Acquired: 03/31/2021 Comorbid Cataracts, Sleep Apnea, Congestive Heart Failure, Weeks Of Treatment: 2 History: Coronary Artery Disease, Hypertension, Osteoarthritis Clustered Wound: No Photos Wound Measurements Length: (cm) 0.3 Width: (cm) 0.2 Depth: (cm) 0.1 Area: (cm) 0.047 Volume: (cm) 0.005 % Reduction in Area: 33.8% % Reduction in Volume: 28.6% Epithelialization: None Tunneling: No Undermining: No Wound Description Classification: Category/Stage II Wound Margin: Flat and Intact Exudate Amount: Medium Exudate Type: Serous Exudate Color: amber Foul Odor After Cleansing: No Slough/Fibrino Yes Wound Bed Granulation Amount: Small (1-33%) Exposed Structure Necrotic Amount: Large (67-100%) Fascia Exposed: No Necrotic Quality: Adherent Slough Fat Layer  (Subcutaneous Tissue) Exposed: Yes Tendon Exposed: No Muscle Exposed:  No Joint Exposed: No Bone Exposed: No Treatment Notes Wound #13 (Gluteus) Wound Laterality: Left Cleanser Normal Saline Discharge Instruction: Wash your hands with soap and water. Remove old dressing, discard into plastic bag and place into trash. Cleanse the wound with Normal Saline prior to applying a clean dressing using gauze sponges, not tissues or cotton balls. Do not scrub or use excessive force. Pat dry using gauze sponges, not tissue or cotton balls. MATTEUS, MCNELLY A. ([id number]) Peri-Wound Care Topical Primary Dressing Silvercel Small 2x2 (in/in) Discharge Instruction: Apply Silvercel Small 2x2 (in/in) as instructed Secondary Dressing Mepilex Border Flex, 4x4 (in/in) Discharge Instruction: Apply to wound as directed. Do not cut. Secured With Compression Wrap Compression Stockings Add-Ons Electronic Signature(s) Signed: 04/16/2021 2:18:56 PM By: Yevonne Pax RN Entered By: Yevonne Pax on 04/16/2021 11:28:06 [name] Hilts A. ([id number]) -------------------------------------------------------------------------------- Vitals Details Patient Name: [name] Hilts A. Date of Service: 04/16/2021 11:15 AM Medical Record Number: [id number] Patient Account Number: [id number] Date of Birth/Sex: Nov 14, 1931 (87 y.o. M) Treating RN: Yevonne Pax Primary Care Kamden Stanislaw: [name] Other Clinician: Referring Shammara Jarrett: [name] Treating Stephanie Littman/Extender: Altamese [place] in Treatment: 25 Vital Signs Time Taken: 11:25 Temperature (F): 97.7 Pulse (bpm): 66 Respiratory Rate (breaths/min): 18 Blood Pressure (mmHg): 122/75 Reference Range: 80 - 120 mg / dl Electronic Signature(s) Signed: 04/16/2021 2:18:56 PM By: Yevonne Pax RN Entered By: Yevonne Pax on 04/16/2021 11:25:38

## 2021-04-20 ENCOUNTER — Encounter: Payer: Self-pay | Admitting: Adult Health Nurse Practitioner

## 2021-04-20 ENCOUNTER — Other Ambulatory Visit: Payer: Self-pay

## 2021-04-20 ENCOUNTER — Non-Acute Institutional Stay: Payer: Medicare Other | Admitting: Adult Health Nurse Practitioner

## 2021-04-20 VITALS — HR 71 | Wt 165.9 lb

## 2021-04-20 DIAGNOSIS — H579 Unspecified disorder of eye and adnexa: Secondary | ICD-10-CM

## 2021-04-20 DIAGNOSIS — Z515 Encounter for palliative care: Secondary | ICD-10-CM

## 2021-04-20 DIAGNOSIS — L89159 Pressure ulcer of sacral region, unspecified stage: Secondary | ICD-10-CM

## 2021-04-20 DIAGNOSIS — E46 Unspecified protein-calorie malnutrition: Secondary | ICD-10-CM

## 2021-04-20 DIAGNOSIS — I5032 Chronic diastolic (congestive) heart failure: Secondary | ICD-10-CM

## 2021-04-20 NOTE — Progress Notes (Signed)
[facility] Palliative Care Consult Note Telephone: [phone]  Fax: [phone]    Date of encounter: 04/20/21 PATIENT NAME: [name] [address] [place] [address]   [phone] (home)  DOB: 1931/10/09 MRN: [id number] PRIMARY CARE PROVIDER:    Dr. Frazier Richards  REFERRING PROVIDER:   Dr. Frazier Richards  RESPONSIBLE PARTY:    Contact Information    Name Relation Home Work Mobile   Kerrie [place] Daughter [phone]     Troxler,Suzanne Daughter [phone]  [phone]       I met face to face with patient in home/facility. Palliative Care was asked to follow this patient by consultation request of  Dr. Frazier Richards to address advance care planning and complex medical decision making. This is a follow up visit.  Spoke with daughter to update on today's visit                                   ASSESSMENT AND PLAN / RECOMMENDATIONS:   Advance Care Planning/Goals of Care: Goals include to maximize quality of life and symptom management.   CODE STATUS: DNR  Symptom Management/Plan:  CHF: This is improved with decreased edema.  No complaints of chest pain, shortness of breath or cough.  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  Patient is being followed at wound clinic.  Continue follow-up and recommendations at wound clinic  Eye pressure: Patient has had recent eye exam with no abnormal findings.  Eye exam was done by in-house optometrist.  The patient continues to have or worsening symptoms may consider taking him to outside optometry  PCM: Patient has been having decreased appetite over the past couple of weeks.  Diarrhea may be related to starting Ensure. Had discussion with daughter about hospice given her father's current nutritional decline.  Staff does report that he is doing better today than he has been.   Daughter does state that he is getting physical therapy and would like to continue this.  We will see patient in 2 weeks for reevaluation.  Daughter encouraged to call sooner if he has continued decline.   Follow up Palliative Care Visit: Palliative care will continue to follow for complex medical decision making, advance care planning, and clarification of goals. Return 2 weeks or prn.  Daughter encouraged to call with any questions or concerns  I spent 60 minutes providing this consultation. More than 50% of the time in this consultation was spent in counseling and care coordination.  PPS: 40%  HOSPICE ELIGIBILITY/DIAGNOSIS: TBD  Chief Complaint: Follow-up palliative visit  HISTORY OF PRESENT ILLNESS:  [name] is a 87 y.o. year old male  with aortic stenosis w/valve replacement (on coumadin), CHF, HTN, CAD, venous stasis.  Patient is having decreased appetite over the past couple of weeks.  Staff does report he has had a 5 pound weight loss in the past week.  Patient has had about an 18 pound weight loss over the past month.  On 03/20/2021 he weighed 184.4 pounds with BMI of 26.46 and on 04/19/2021 he weighed 165.9 pounds with BMI of 23.8.  Patient looks thinner today from last appointment about 6 weeks ago.  Staff and daughter do report that when he does eat he just eats bites.  Lab work did show elevated bun and creatinine most  likely related to poor oral intake.  Family has just started boost this week and patient has been having diarrhea that started yesterday.  Patient denies N/V, cramping, fever.  Patient has had some recent episodes of choking and coughing while eating and diet was changed to chopped with thin liquids.  Ramm swallow showed normal swallowing function.  Patient does have stage II pressure injury to gluteal cleft.  This is being treated at wound clinic.  He does have some pain when sitting in chair due to the wound.  Daughter does state that staff has been trying to get  him to lie down in bed throughout the day to get pressure off the wound but he prefers to either sit in his recliner or wheelchair.  Patient did have a fall on 03/24/2021 and did have x-rays of right ankle and left wrist due to pain post fall.  There were no acute findings on the x-rays.  Patient is complaining that he feels like there is some pressure of his eyes and does state having headaches.  Patient has recently had eye exam done.  Rest of 10 point ROS asked and negative except what stated in HPI  History obtained from review of EMR and interview with family, facility staff and [name].   PHYSICAL EXAM:  General: NAD, frail appearing Eyes: sclera anicteric and noninjected with no discharge noted Cardiovascular: regular rate and rhythm Pulmonary:lung sounds clear; normal respiratory effort Abdomen: soft, nontender, + bowel sounds Extremities: no edema, no joint deformities; has wound to left lower leg, did not take off bandage for exam (being followed at wound clinic) Skin: no rasheson exposed skin Neurological: Weakness; drowsiness   Thank you for the opportunity to participate in the care of [name].  The palliative care team will continue to follow. Please call our office at [phone] if we can be of additional assistance.   An Lannan Jenetta Downer, NP , DNP  This chart was dictated using voice recognition software. Despite best efforts to proofread, errors can occur which can change the documentation meaning.   COVID-19 PATIENT SCREENING TOOL Asked and negative response unless otherwise noted:   Have you had symptoms of covid, tested positive or been in contact with someone with symptoms/positive test in the past 5-10 days?  Negative

## 2021-04-27 ENCOUNTER — Other Ambulatory Visit
Admission: RE | Admit: 2021-04-27 | Discharge: 2021-04-27 | Disposition: A | Discharge status: Home / Self Care | Payer: Medicare Other | Source: Skilled Nursing Facility | Attending: Internal Medicine | Admitting: Internal Medicine

## 2021-04-27 DIAGNOSIS — I4891 Unspecified atrial fibrillation: Secondary | ICD-10-CM | POA: Diagnosis present

## 2021-04-27 DIAGNOSIS — Z7901 Long term (current) use of anticoagulants: Secondary | ICD-10-CM | POA: Diagnosis present

## 2021-04-27 LAB — PROTIME-INR
INR: 8 (ref 0.8–1.2)
Prothrombin Time: 64.3 seconds — ABNORMAL HIGH (ref 11.4–15.2)

## 2021-04-30 ENCOUNTER — Other Ambulatory Visit: Payer: Self-pay

## 2021-04-30 ENCOUNTER — Encounter: Payer: Medicare Other | Attending: Physician Assistant | Admitting: Physician Assistant

## 2021-04-30 DIAGNOSIS — L89152 Pressure ulcer of sacral region, stage 2: Secondary | ICD-10-CM | POA: Diagnosis not present

## 2021-04-30 DIAGNOSIS — Z7901 Long term (current) use of anticoagulants: Secondary | ICD-10-CM | POA: Insufficient documentation

## 2021-04-30 DIAGNOSIS — Z952 Presence of prosthetic heart valve: Secondary | ICD-10-CM | POA: Insufficient documentation

## 2021-04-30 DIAGNOSIS — L97822 Non-pressure chronic ulcer of other part of left lower leg with fat layer exposed: Secondary | ICD-10-CM | POA: Insufficient documentation

## 2021-04-30 DIAGNOSIS — I5042 Chronic combined systolic (congestive) and diastolic (congestive) heart failure: Secondary | ICD-10-CM | POA: Insufficient documentation

## 2021-04-30 DIAGNOSIS — G629 Polyneuropathy, unspecified: Secondary | ICD-10-CM | POA: Insufficient documentation

## 2021-04-30 DIAGNOSIS — I890 Lymphedema, not elsewhere classified: Secondary | ICD-10-CM | POA: Insufficient documentation

## 2021-04-30 DIAGNOSIS — N1832 Chronic kidney disease, stage 3b: Secondary | ICD-10-CM | POA: Diagnosis not present

## 2021-04-30 DIAGNOSIS — L97812 Non-pressure chronic ulcer of other part of right lower leg with fat layer exposed: Secondary | ICD-10-CM | POA: Diagnosis not present

## 2021-04-30 DIAGNOSIS — I872 Venous insufficiency (chronic) (peripheral): Secondary | ICD-10-CM | POA: Insufficient documentation

## 2021-04-30 DIAGNOSIS — I130 Hypertensive heart and chronic kidney disease with heart failure and stage 1 through stage 4 chronic kidney disease, or unspecified chronic kidney disease: Secondary | ICD-10-CM | POA: Diagnosis not present

## 2021-04-30 DIAGNOSIS — I2510 Atherosclerotic heart disease of native coronary artery without angina pectoris: Secondary | ICD-10-CM | POA: Insufficient documentation

## 2021-04-30 DIAGNOSIS — Z950 Presence of cardiac pacemaker: Secondary | ICD-10-CM | POA: Insufficient documentation

## 2021-04-30 NOTE — Progress Notes (Addendum)
[name], [name] ([id number]) Visit Report for 04/30/2021 Chief Complaint Document Details Patient Name: CARLIS, BLANCHARD A. Date of Service: 04/30/2021 9:45 AM Medical Record Number: [id number] Patient Account Number: [id number] Date of Birth/Sex: 10/14/1931 (87 y.o. M) Treating RN: Hansel Feinstein Primary Care Provider: [name] Other Clinician: Referring Provider: [name] Treating Provider/Extender: Rowan Blase in Treatment: 27 Information Obtained from: Patient Chief Complaint Sacral and Left Gluteal Ulcers Electronic Signature(s) Signed: 04/30/2021 10:14:49 AM By: Lenda Kelp PA-C Entered By: Lenda Kelp on 04/30/2021 10:14:49 [name] Hilts A. ([id number]) -------------------------------------------------------------------------------- HPI Details Patient Name: [name] Hilts A. Date of Service: 04/30/2021 9:45 AM Medical Record Number: [id number] Patient Account Number: [id number] Date of Birth/Sex: 1931-11-08 (87 y.o. M) Treating RN: Hansel Feinstein Primary Care Provider: [name] Other Clinician: Referring Provider: [name] Treating Provider/Extender: Rowan Blase in Treatment: 27 History of Present Illness HPI Description: 04/21/2019 ADMISSION This is an independent 87 year old man who lives in the independent part of [address] of [place]. He has a history of chronic lower extremity edema and wears compression stockings. He states about a month ago he was taking off the one on the right and pulled some skin off accidentally. He has had 2 small wounds on the right anterior and right medial lower extremity. I note looking through [facility] health link that he had multiple venous ultrasounds in 2015 and 16. These were DVT rule outs. He did have a Baker's cyst on the left. He has not not had a previous wound history although he did have a history of cellulitis in his legs. Past medical history; includes aortic stenosis status post mechanical AVR in 2007  on chronic Coumadin, lower extremity edema with a history of cellulitis, history of MRSA. ABIs in our clinic were 1.1 on the right 6/3; patient with predominantly venous insufficiency ulcers in the right lower leg probably some degree of lymphedema. He had to wounds last week. We put him in 3 layer compression. The nurses at [facility] are changing the dressing. The area laterally is healed but he still has a small very painful area on the anterior tibial area. He is on Coumadin because of mechanical aortic valve. 6/10; venous insufficiency ulcers on the right lower leg. He has some degree of lymphedema. We have been using 3 layer compression with silver collagen small wound. Dimensions are improved. I gave him doxycycline last week which he tolerated because of surrounding erythema. This is improved also 6/17; arrives in clinic today with portable silver collagen dressing tightly adherent to the wound. Also felt that the wrap was too tight 3 layer being changed at the [facility] 6/24; patient's wound is small still adherent debris over the surface however even under illumination it was hard to see what was still open here. We have been using silver collagen 7/1; the patient arrives in the clinic today and the area on the right lower leg is healed. He has severe chronic venous insufficiency. He has 20/30 compression stockings. Readmission: 06/22/2020 on evaluation today patient presents for reevaluation here in our clinic although it has been a little over a year since we last saw him. He does have a history of lymphedema, venous insufficiency, anticoagulant therapy, he is on a pacemaker which is the reason for the anticoagulants, hypertension, and congestive heart failure. With that being said unfortunately he has been dealing with a wound of the left and right legs which has been giving him some trouble since arrival first 2021. That is  in regard to the left leg ulcer. In  regard to the right leg ulcer this is just a very small area that really I think will seal up quite nicely is more of the lymphedema opening than anything else. With that being said unfortunately the left leg is quite significant. We actually have noted that the patient's been dealing with this for quite some time and I think that there is a chance we may want to consider biopsy. With that being said he is on Coumadin therefore we were not able to perform a biopsy at this point. I think that something however in the future that may be warranted we may just have to take him off of the current Coumadin regimen in order to do this. We discussed doing it today but I am more concerned about the fact that he could have issues with uncontrolled bleeding and in that case we may have to send him to the ER. His daughter was not wanting to take that chance which I can completely understand. Nonetheless so far they have been using antibiotic ointment over the area I am just putting a protective dressing I do believe the patient is that he needs some type of compression. 07/07/2020 on evaluation today patient appears to be doing poorly in regard to his bilateral lower extremities today. He actually went to urgent care last night due to having pain in his legs due to the wraps. It appears they got somewhat tight on him because of the fact that he is having such significant swelling. He appears to be fluid overloaded I do not see any signs of infection actively at this point which is good news but unfortunately I think that this is something that may need to be addressed even by cardiology more so than just with a different or alternate type of wrap. I will contact his cardiologist, Dr. Mariah Milling, as well. 07/10/2020 upon evaluation today patient unfortunately appears to be doing worse even than when I saw him on Friday from the standpoint of where his legs stand. The main issue is that he has increased erythema although  there is still not warm to touch I am concerned about infection and about this getting significantly worse. Again my initial concern was more fluid overload and I discussed this with his cardiology office on Friday. With that being said I feel like this has gotten worse his daughter is in agreement and I think that the best option would be to send him to the hospital at this point. READMISSION 09/06/2020 Is a patient who has had 2 stays in this clinic firstly in 2020 and more recently from 06/02/2020 through 07/10/2020. He was cared for by [facility]. When he was last here he had bilateral lower extremity ulcers in the setting of chronic venous insufficiency with lymphedema. When he we last saw him he had weeping edema fluido Cellulitis and he was sent to the hospital. He was admitted initially from 06/30/2020 through 07/14/2020 with cellulitis plus stasis dermatitis. Treated empirically with vancomycin and had increases in his torsemide. He was then sent to the skilled level of [facility] but readmitted to hospital from 07/29/2020 through 08/10/2020 with blood loss anemia hemoglobin of 4 and OB positive stools. During this hospitalization he was noted to have a sacral wound at stage II. He was sent to [facility] [place] again in the skilled level. He had compression on his legs with Coban. He is now back at his independent living setting.  He has not been wearing compression and his daughter feels there is already increasing swelling. To have stockings at home not exactly sure at what strength. [name] HiltsSCHENK, Ramadan A. ([id number]) Past medical history includes lymphedema, chronic venous insufficiency, pacemaker, congestive heart failure, hypertension, aortic valve stenosis status post mechanical valve replacement in 2002 on chronic Coumadin, chronic kidney disease stage IIIb, atrial fibrillation, peripheral neuropathy, large hiatal hernia. We did not test his ABIs in the clinic  today. Readmission: 10/17/2020 on evaluation today patient appears to be doing somewhat poorly in regard to his left leg and the fact that he does have an open wound at this point. He has not had the compression socks placed at the facility they have not been putting them on at all unfortunately. He does not have on the right leg currently and apparently there is significant put him on the left leg with the wound which I understand is that can be somewhat tight obviously. Nonetheless I do believe the right leg needs to have a compression sock and I do believe on the left side right now we can do something a little better in order to try to get the wound healed at this point. The patient is on torsemide and currently allopurinol for gout. He also has daily weights due to congestive heart failure I am assuming in order to monitor his fluid status overall. He is currently a DNR at the facility. 10/30/2020 upon evaluation today patient appears to be doing somewhat poorly at this time in regard to his bilateral lower extremities. His left leg which is the only place where he had a wound last week when I saw him is actually doing worse with new wound openings. The right leg also has a small wound as well. However the most concerning thing is that last week he did not have any signs of infection or erythema there is no warmth to touch over the leg. This week the entire leg is more sore he also has erythema extending from the ankle to just below the knee with the area being erythematous and warm to touch which I believe represents cellulitis at minimum. I am concerned about how this is spread so quickly and the fact that he is having increased pain and also not feeling as well. All in all I discussed with the patient that unfortunately I feel like he may be best served going to the ER for further evaluation of this issue currently to try to see about the potential for IV antibiotics and to have  appropriate lab work performed. His daughter is present during the visit today and she was included in the decision making at this point. 11/13/2020 upon evaluation today patient appears to be doing well with regard to his leg ulcer. Fortunately he does not appear to be showing signs of infection anything like it was previous. This is great news. He was sent home with IV antibiotic therapy that seems to have done extremely well for him. No fevers, chills, nausea, vomiting, or diarrhea. 11/27/2020 upon evaluation today patient appears to be doing quite well in regard to his wounds currently. Fortunately there is no signs of active infection at this time which is great news and I am very pleased in this regard. He did have some concerns or his daughter did about an area on his gluteal region. With that being said I see nothing that is actually open at this point which is great news I do think continuing with a derma  cloud is a good way to go. With regard to his leg he does have a new area on the right leg of open wound locations which is new compared to last visit we will need to address this as well. Otherwise he seems to be doing quite well in my opinion. 12/15/2020 upon evaluation today patient appears to be doing well with regard to his leg ulcer on the left. With that being said he did not have the Tubigrip on which I think is integral to getting this closed as he continues to weep. Subsequently I think the Tubigrip will help control some of the edema which will help him in that regard. Fortunately there is no signs of active infection at this time. No fevers, chills, nausea, vomiting, or diarrhea. 01/01/2021 upon evaluation today patient appears to be doing well with regard to his wound. This is measuring better and actually is significantly smaller. Fortunately there is no signs of active infection at this time. No fevers, chills, nausea, vomiting, or diarrhea. 01/15/2021 on evaluation today patient  appears to be doing well with regard to his left leg ulcer though he still continues to weep quite a bit he did not have the Tubigrip on the day that he supposed to. Fortunately there is no signs of active infection at this time. No fevers, chills, nausea, vomiting, or diarrhea. 01/29/2021 upon evaluation today patient appears to be doing well with regard to his right leg which is completely healed. His left leg though not healed is just a very small open area that is weeping. With regard to his gluteal region this is actually new and in fact he has a wound that is really in the sacral area. This is stated to be a stage II that may be the case there is really no slough buildup I [name] leave it as such for the time being. We will keep a close eye on things however and see how it proceeds. 02/12/2021 upon evaluation today patient appears to be doing excellent in regard to his leg ulcers. In fact everything appears to be closed as best I can tell at this point. I think he is ready to transition into his compression stocking. With that being said his gluteal region still is open although it appears to be fairly clean I think the collagen is still a good thing to do here. We will see how things continue to improve over the next week or so. 02/26/2021 on evaluation today patient appears to be doing well with regard to his wound. He is tolerating the dressing changes without complication the wound is smaller though I feel like he is at the point where we need to try to do something to dry this up a little bit more I think switching from silver collagen to a silver alginate would be beneficial. 03/16/2021 upon evaluation today patient appears to be doing excellent in regard to his wound. He has been tolerating the dressing changes without complication. Fortunately there is no signs of active infection at this time. No fevers, chills, nausea, vomiting, or diarrhea. 04/02/2021 upon evaluation today patient appears to  be doing well with regard to his wound. He really does not have anything terribly open but nonetheless he still has a lot of redness in the sacral area I think there is still a lot of pressure getting to this region. With that being said I think he really needs to have more appropriate offloading I think that may be the biggest issue  he spends a lot of time sitting in his recliner from what I am hearing. 5/23; there has been some improvement in the sacral area not so much in the area on the left buttock. Both of these very tiny punched-out areas with surrounding erythema. They seem to be tender out of proportion to what you might expect looking at them. We have been using silver alginate on this area 04/30/2021 upon evaluation today patient appears to be doing a little bit more poorly in regard to the wounds in the gluteal region and sacral region. Fortunately there does not appear to be any signs of active infection which is great news but I do feel like there is some additional pressure injury here. I did discuss with the patient again today trying to keep pressure off the area I think this is still of utmost importance. Fortunately there is no signs of active infection systemically which is great news. [name], [name] ([id number]) Electronic Signature(s) Signed: 04/30/2021 3:37:08 PM By: Lenda Kelp PA-C Entered By: Lenda Kelp on 04/30/2021 15:37:07 Joellyn Quails ([id number]) -------------------------------------------------------------------------------- Physical Exam Details Patient Name: [name] Hilts A. Date of Service: 04/30/2021 9:45 AM Medical Record Number: [id number] Patient Account Number: [id number] Date of Birth/Sex: 07-22-1931 (87 y.o. M) Treating RN: Hansel Feinstein Primary Care Provider: [name] Other Clinician: Referring Provider: [name] Treating Provider/Extender: Rowan Blase in Treatment: 27 Constitutional Well-nourished and well-hydrated in no  acute distress. Respiratory normal breathing without difficulty. Psychiatric this patient is able to make decisions and demonstrates good insight into disease process. Alert and Oriented x 3. pleasant and cooperative. Notes Upon inspection patient's wound bed actually showed signs of good granulation epithelization at this point. There does not appear to be any evidence of infection which is great and overall very pleased with where things stand. I do think that however he needs to keep pressure off of the area I discussed that with him I discussed it with his daughter the big issue is his dementia and whether or not he is can actually follow through with the recommendations. Electronic Signature(s) Signed: 04/30/2021 3:37:44 PM By: Lenda Kelp PA-C Entered By: Lenda Kelp on 04/30/2021 15:37:44 Joellyn Quails ([id number]) -------------------------------------------------------------------------------- Physician Orders Details Patient Name: [name] Hilts A. Date of Service: 04/30/2021 9:45 AM Medical Record Number: [id number] Patient Account Number: [id number] Date of Birth/Sex: [birth year] (87 y.o. M) Treating RN: Hansel Feinstein Primary Care Provider: [name] Other Clinician: Referring Provider: [name] Treating Provider/Extender: Rowan Blase in Treatment: 27 Verbal / Phone Orders: No Diagnosis Coding ICD-10 Coding Code Description L89.152 Pressure ulcer of sacral region, stage 2 Z79.01 Long term (current) use of anticoagulants Z95.0 Presence of cardiac pacemaker I10 Essential (primary) hypertension I50.42 Chronic combined systolic (congestive) and diastolic (congestive) heart failure I25.10 Atherosclerotic heart disease of native coronary artery without angina pectoris Follow-up Appointments o Return Appointment in 2 weeks. Edema Control - Lymphedema / Segmental Compressive Device / Other Bilateral Lower Extremities o Patient to wear own compression  stockings. Remove compression stockings every night before going to bed and put on every morning when getting up. - bilat lower leg o Elevate legs to the level of the heart and pump ankles as often as possible o Elevate leg(s) parallel to the floor when sitting. Off-Loading o Gel wheelchair cushion - keep gel cushion under him when out of bed at all times o Low air-loss mattress (Group 2) - needs an air mattress to offload pressure off of wounds   o Turn and reposition every 2 hours - keep off of sacral wound area/REPOSITION EVERY 2 HOURS FOR WOUND HEALIN Additional Orders / Instructions Wound #12 Sacrum o Other: - KEEP DRESSINGS ON BOTH SACRAL WOUNDS/FOLLOW WOUND CARE ORDERS Wound #13 Left Gluteus o Other: - KEEP DRESSINGS ON BOTH SACRAL WOUNDS/FOLLOW WOUND CARE ORDERS Wound Treatment Wound #12 - Sacrum Cleanser: Normal Saline (Generic) 3 x Per Week/30 Days Discharge Instructions: Wash your hands with soap and water. Remove old dressing, discard into plastic bag and place into trash. Cleanse the wound with Normal Saline prior to applying a clean dressing using gauze sponges, not tissues or cotton balls. Do not scrub or use excessive force. Pat dry using gauze sponges, not tissue or cotton balls. Primary Dressing: Hydrofera Blue Ready Transfer Foam, 2.5x2.5 (in/in) 3 x Per Week/30 Days Discharge Instructions: Apply Hydrofera Blue Ready to wound bed as directed Secondary Dressing: Mepilex Border Flex, 4x4 (in/in) (Generic) 3 x Per Week/30 Days Discharge Instructions: Apply to wound as directed. Do not cut. Wound #13 - Gluteus Wound Laterality: Left Cleanser: Normal Saline (Generic) 3 x Per Week/30 Days Discharge Instructions: Wash your hands with soap and water. Remove old dressing, discard into plastic bag and place into trash. Cleanse the wound with Normal Saline prior to applying a clean dressing using gauze sponges, not tissues or cotton balls. Do not scrub or use excessive  force. Pat dry using gauze sponges, not tissue or cotton balls. Primary Dressing: Hydrofera Blue Ready Transfer Foam, 2.5x2.5 (in/in) 3 x Per Week/30 Days Discharge Instructions: Apply Hydrofera Blue Ready to wound bed as directed [facility] [place] A. ([id number]) Secondary Dressing: Mepilex Border Flex, 4x4 (in/in) (Generic) 3 x Per Week/30 Days Discharge Instructions: Apply to wound as directed. Do not cut. Electronic Signature(s) Signed: 04/30/2021 3:39:51 PM By: Hansel Feinstein Signed: 04/30/2021 4:00:10 PM By: Lenda Kelp PA-C Entered By: Hansel Feinstein on 04/30/2021 10:42:08 Joellyn Quails ([id number]) -------------------------------------------------------------------------------- Problem List Details Patient Name: [name] Hilts A. Date of Service: 04/30/2021 9:45 AM Medical Record Number: [id number] Patient Account Number: [id number] Date of Birth/Sex: [birth year] (87 y.o. M) Treating RN: Hansel Feinstein Primary Care Provider: [name] Other Clinician: Referring Provider: [name] Treating Provider/Extender: Rowan Blase in Treatment: 27 Active Problems ICD-10 Encounter Code Description Active Date MDM Diagnosis L89.152 Pressure ulcer of sacral region, stage 2 01/29/2021 No Yes Z79.01 Long term (current) use of anticoagulants 10/17/2020 No Yes Z95.0 Presence of cardiac pacemaker 10/17/2020 No Yes I10 Essential (primary) hypertension 10/17/2020 No Yes I50.42 Chronic combined systolic (congestive) and diastolic (congestive) heart 10/17/2020 No Yes failure I25.10 Atherosclerotic heart disease of native coronary artery without angina 10/17/2020 No Yes pectoris Inactive Problems ICD-10 Code Description Active Date Inactive Date I89.0 Lymphedema, not elsewhere classified 10/17/2020 10/17/2020 I87.2 Venous insufficiency (chronic) (peripheral) 10/17/2020 10/17/2020 L97.822 Non-pressure chronic ulcer of other part of left lower leg with fat layer exposed 10/17/2020  10/17/2020 L97.812 Non-pressure chronic ulcer of other part of right lower leg with fat layer 10/17/2020 10/17/2020 exposed Resolved Problems Electronic Signature(s) Signed: 04/30/2021 10:14:42 AM By: Geni Bers, Mosiah A. ([id number]) Entered By: Lenda Kelp on 04/30/2021 10:14:42 [name] Hilts A. ([id number]) -------------------------------------------------------------------------------- Progress Note Details Patient Name: [name] Hilts A. Date of Service: 04/30/2021 9:45 AM Medical Record Number: [id number] Patient Account Number: [id number] Date of Birth/Sex: 1931/01/15 (87 y.o. M) Treating RN: Hansel Feinstein Primary Care Provider: [name] Other Clinician: Referring Provider: [name] Treating Provider/Extender: Rowan Blase in Treatment: 27 Subjective Chief Complaint Information  obtained from Patient Sacral and Left Gluteal Ulcers History of Present Illness (HPI) 04/21/2019 ADMISSION This is an independent 87 year old man who lives in the independent part of [address] of [place]. He has a history of chronic lower extremity edema and wears compression stockings. He states about a month ago he was taking off the one on the right and pulled some skin off accidentally. He has had 2 small wounds on the right anterior and right medial lower extremity. I note looking through [facility] health link that he had multiple venous ultrasounds in 2015 and 16. These were DVT rule outs. He did have a Baker's cyst on the left. He has not not had a previous wound history although he did have a history of cellulitis in his legs. Past medical history; includes aortic stenosis status post mechanical AVR in 2007 on chronic Coumadin, lower extremity edema with a history of cellulitis, history of MRSA. ABIs in our clinic were 1.1 on the right 6/3; patient with predominantly venous insufficiency ulcers in the right lower leg probably some degree of lymphedema. He had to wounds  last week. We put him in 3 layer compression. The nurses at [facility] are changing the dressing. The area laterally is healed but he still has a small very painful area on the anterior tibial area. He is on Coumadin because of mechanical aortic valve. 6/10; venous insufficiency ulcers on the right lower leg. He has some degree of lymphedema. We have been using 3 layer compression with silver collagen small wound. Dimensions are improved. I gave him doxycycline last week which he tolerated because of surrounding erythema. This is improved also 6/17; arrives in clinic today with portable silver collagen dressing tightly adherent to the wound. Also felt that the wrap was too tight 3 layer being changed at the [facility] 6/24; patient's wound is small still adherent debris over the surface however even under illumination it was hard to see what was still open here. We have been using silver collagen 7/1; the patient arrives in the clinic today and the area on the right lower leg is healed. He has severe chronic venous insufficiency. He has 20/30 compression stockings. Readmission: 06/22/2020 on evaluation today patient presents for reevaluation here in our clinic although it has been a little over a year since we last saw him. He does have a history of lymphedema, venous insufficiency, anticoagulant therapy, he is on a pacemaker which is the reason for the anticoagulants, hypertension, and congestive heart failure. With that being said unfortunately he has been dealing with a wound of the left and right legs which has been giving him some trouble since arrival first 2021. That is in regard to the left leg ulcer. In regard to the right leg ulcer this is just a very small area that really I think will seal up quite nicely is more of the lymphedema opening than anything else. With that being said unfortunately the left leg is quite significant. We actually have noted that the patient's  been dealing with this for quite some time and I think that there is a chance we may want to consider biopsy. With that being said he is on Coumadin therefore we were not able to perform a biopsy at this point. I think that something however in the future that may be warranted we may just have to take him off of the current Coumadin regimen in order to do this. We discussed doing it today but I am more concerned  about the fact that he could have issues with uncontrolled bleeding and in that case we may have to send him to the ER. His daughter was not wanting to take that chance which I can completely understand. Nonetheless so far they have been using antibiotic ointment over the area I am just putting a protective dressing I do believe the patient is that he needs some type of compression. 07/07/2020 on evaluation today patient appears to be doing poorly in regard to his bilateral lower extremities today. He actually went to urgent care last night due to having pain in his legs due to the wraps. It appears they got somewhat tight on him because of the fact that he is having such significant swelling. He appears to be fluid overloaded I do not see any signs of infection actively at this point which is good news but unfortunately I think that this is something that may need to be addressed even by cardiology more so than just with a different or alternate type of wrap. I will contact his cardiologist, Dr. Mariah Milling, as well. 07/10/2020 upon evaluation today patient unfortunately appears to be doing worse even than when I saw him on Friday from the standpoint of where his legs stand. The main issue is that he has increased erythema although there is still not warm to touch I am concerned about infection and about this getting significantly worse. Again my initial concern was more fluid overload and I discussed this with his cardiology office on Friday. With that being said I feel like this has gotten worse  his daughter is in agreement and I think that the best option would be to send him to the hospital at this point. READMISSION 09/06/2020 Is a patient who has had 2 stays in this clinic firstly in 2020 and more recently from 06/02/2020 through 07/10/2020. He was cared for by [facility]. When he was last here he had bilateral lower extremity ulcers in the setting of chronic venous insufficiency with lymphedema. When he we last saw him he had weeping edema fluido Cellulitis and he was sent to the hospital. He was admitted initially from 06/30/2020 through 07/14/2020 [facility], [name] ([id number]) with cellulitis plus stasis dermatitis. Treated empirically with vancomycin and had increases in his torsemide. He was then sent to the skilled level of [facility] but readmitted to hospital from 07/29/2020 through 08/10/2020 with blood loss anemia hemoglobin of 4 and OB positive stools. During this hospitalization he was noted to have a sacral wound at stage II. He was sent to [facility] again in the skilled level. He had compression on his legs with Coban. He is now back at his independent living setting. He has not been wearing compression and his daughter feels there is already increasing swelling. To have stockings at home not exactly sure at what strength. Past medical history includes lymphedema, chronic venous insufficiency, pacemaker, congestive heart failure, hypertension, aortic valve stenosis status post mechanical valve replacement in 2002 on chronic Coumadin, chronic kidney disease stage IIIb, atrial fibrillation, peripheral neuropathy, large hiatal hernia. We did not test his ABIs in the clinic today. Readmission: 10/17/2020 on evaluation today patient appears to be doing somewhat poorly in regard to his left leg and the fact that he does have an open wound at this point. He has not had the compression socks placed at the facility they have not been putting them on at all  unfortunately. He does not have on the right leg currently  and apparently there is significant put him on the left leg with the wound which I understand is that can be somewhat tight obviously. Nonetheless I do believe the right leg needs to have a compression sock and I do believe on the left side right now we can do something a little better in order to try to get the wound healed at this point. The patient is on torsemide and currently allopurinol for gout. He also has daily weights due to congestive heart failure I am assuming in order to monitor his fluid status overall. He is currently a DNR at the facility. 10/30/2020 upon evaluation today patient appears to be doing somewhat poorly at this time in regard to his bilateral lower extremities. His left leg which is the only place where he had a wound last week when I saw him is actually doing worse with new wound openings. The right leg also has a small wound as well. However the most concerning thing is that last week he did not have any signs of infection or erythema there is no warmth to touch over the leg. This week the entire leg is more sore he also has erythema extending from the ankle to just below the knee with the area being erythematous and warm to touch which I believe represents cellulitis at minimum. I am concerned about how this is spread so quickly and the fact that he is having increased pain and also not feeling as well. All in all I discussed with the patient that unfortunately I feel like he may be best served going to the ER for further evaluation of this issue currently to try to see about the potential for IV antibiotics and to have appropriate lab work performed. His daughter is present during the visit today and she was included in the decision making at this point. 11/13/2020 upon evaluation today patient appears to be doing well with regard to his leg ulcer. Fortunately he does not appear to be showing signs of infection  anything like it was previous. This is great news. He was sent home with IV antibiotic therapy that seems to have done extremely well for him. No fevers, chills, nausea, vomiting, or diarrhea. 11/27/2020 upon evaluation today patient appears to be doing quite well in regard to his wounds currently. Fortunately there is no signs of active infection at this time which is great news and I am very pleased in this regard. He did have some concerns or his daughter did about an area on his gluteal region. With that being said I see nothing that is actually open at this point which is great news I do think continuing with a derma cloud is a good way to go. With regard to his leg he does have a new area on the right leg of open wound locations which is new compared to last visit we will need to address this as well. Otherwise he seems to be doing quite well in my opinion. 12/15/2020 upon evaluation today patient appears to be doing well with regard to his leg ulcer on the left. With that being said he did not have the Tubigrip on which I think is integral to getting this closed as he continues to weep. Subsequently I think the Tubigrip will help control some of the edema which will help him in that regard. Fortunately there is no signs of active infection at this time. No fevers, chills, nausea, vomiting, or diarrhea. 01/01/2021 upon evaluation today patient appears to  be doing well with regard to his wound. This is measuring better and actually is significantly smaller. Fortunately there is no signs of active infection at this time. No fevers, chills, nausea, vomiting, or diarrhea. 01/15/2021 on evaluation today patient appears to be doing well with regard to his left leg ulcer though he still continues to weep quite a bit he did not have the Tubigrip on the day that he supposed to. Fortunately there is no signs of active infection at this time. No fevers, chills, nausea, vomiting, or diarrhea. 01/29/2021 upon  evaluation today patient appears to be doing well with regard to his right leg which is completely healed. His left leg though not healed is just a very small open area that is weeping. With regard to his gluteal region this is actually new and in fact he has a wound that is really in the sacral area. This is stated to be a stage II that may be the case there is really no slough buildup I [name] leave it as such for the time being. We will keep a close eye on things however and see how it proceeds. 02/12/2021 upon evaluation today patient appears to be doing excellent in regard to his leg ulcers. In fact everything appears to be closed as best I can tell at this point. I think he is ready to transition into his compression stocking. With that being said his gluteal region still is open although it appears to be fairly clean I think the collagen is still a good thing to do here. We will see how things continue to improve over the next week or so. 02/26/2021 on evaluation today patient appears to be doing well with regard to his wound. He is tolerating the dressing changes without complication the wound is smaller though I feel like he is at the point where we need to try to do something to dry this up a little bit more I think switching from silver collagen to a silver alginate would be beneficial. 03/16/2021 upon evaluation today patient appears to be doing excellent in regard to his wound. He has been tolerating the dressing changes without complication. Fortunately there is no signs of active infection at this time. No fevers, chills, nausea, vomiting, or diarrhea. 04/02/2021 upon evaluation today patient appears to be doing well with regard to his wound. He really does not have anything terribly open but nonetheless he still has a lot of redness in the sacral area I think there is still a lot of pressure getting to this region. With that being said I think he really needs to have more appropriate  offloading I think that may be the biggest issue he spends a lot of time sitting in his recliner from what I am hearing. 5/23; there has been some improvement in the sacral area not so much in the area on the left buttock. Both of these very tiny punched-out areas with surrounding erythema. They seem to be tender out of proportion to what you might expect looking at them. We have been using silver alginate on this area 04/30/2021 upon evaluation today patient appears to be doing a little bit more poorly in regard to the wounds in the gluteal region and sacral Cappucci, [name] ([id number]) region. Fortunately there does not appear to be any signs of active infection which is great news but I do feel like there is some additional pressure injury here. I did discuss with the patient again today trying to keep pressure  off the area I think this is still of utmost importance. Fortunately there is no signs of active infection systemically which is great news. Objective Constitutional Well-nourished and well-hydrated in no acute distress. Vitals Time Taken: 9:55 AM, Temperature: 97.6 F, Pulse: 79 bpm, Respiratory Rate: 16 breaths/min, Blood Pressure: 124/74 mmHg. Respiratory normal breathing without difficulty. Psychiatric this patient is able to make decisions and demonstrates good insight into disease process. Alert and Oriented x 3. pleasant and cooperative. General Notes: Upon inspection patient's wound bed actually showed signs of good granulation epithelization at this point. There does not appear to be any evidence of infection which is great and overall very pleased with where things stand. I do think that however he needs to keep pressure off of the area I discussed that with him I discussed it with his daughter the big issue is his dementia and whether or not he is can actually follow through with the recommendations. Integumentary (Hair, Skin) Wound #12 status is Open. Original cause of  wound was Pressure Injury. The date acquired was: 01/18/2021. The wound has been in treatment 13 weeks. The wound is located on the Sacrum. The wound measures 0.4cm length x 0.4cm width x 0.1cm depth; 0.126cm^2 area and 0.013cm^3 volume. There is Fat Layer (Subcutaneous Tissue) exposed. There is a small amount of serous drainage noted. The wound margin is flat and intact. There is large (67-100%) red, pink granulation within the wound bed. There is a small (1-33%) amount of necrotic tissue within the wound bed including Adherent Slough. Wound #13 status is Open. Original cause of wound was Shear/Friction. The date acquired was: 03/31/2021. The wound has been in treatment 4 weeks. The wound is located on the Left Gluteus. The wound measures 0.5cm length x 0.5cm width x 0.1cm depth; 0.196cm^2 area and 0.02cm^3 volume. There is Fat Layer (Subcutaneous Tissue) exposed. There is no tunneling or undermining noted. There is a medium amount of serous drainage noted. The wound margin is flat and intact. There is large (67-100%) red, pink granulation within the wound bed. There is a small (1-33%) amount of necrotic tissue within the wound bed including Adherent Slough. 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 Edema Control - Lymphedema / Segmental Compressive Device / Other: Patient to wear own compression stockings. Remove compression stockings every night before going to bed and put on every morning when getting up. - bilat lower leg Elevate legs to the level of the heart and pump ankles as often as possible Elevate leg(s) parallel to the floor when  sitting. Off-Loading: DENARIO, BAGOT A. ([id number]) Gel wheelchair cushion - keep gel cushion under him when out of bed at all times Low air-loss mattress (Group 2) - needs an air mattress to offload pressure off of wounds Turn and reposition every 2 hours - keep off of sacral wound area/REPOSITION EVERY 2 HOURS FOR WOUND HEALIN Additional Orders / Instructions: Wound #12 Sacrum: Other: - KEEP DRESSINGS ON BOTH SACRAL WOUNDS/FOLLOW WOUND CARE ORDERS Wound #13 Left Gluteus: Other: - KEEP DRESSINGS ON BOTH SACRAL WOUNDS/FOLLOW WOUND CARE ORDERS WOUND #12: - Sacrum Wound Laterality: Cleanser: Normal Saline (Generic) 3 x Per Week/30 Days Discharge Instructions: Wash your hands with soap and water. Remove old dressing, discard into plastic  bag and place into trash. Cleanse the wound with Normal Saline prior to applying a clean dressing using gauze sponges, not tissues or cotton balls. Do not scrub or use excessive force. Pat dry using gauze sponges, not tissue or cotton balls. Primary Dressing: Hydrofera Blue Ready Transfer Foam, 2.5x2.5 (in/in) 3 x Per Week/30 Days Discharge Instructions: Apply Hydrofera Blue Ready to wound bed as directed Secondary Dressing: Mepilex Border Flex, 4x4 (in/in) (Generic) 3 x Per Week/30 Days Discharge Instructions: Apply to wound as directed. Do not cut. WOUND #13: - Gluteus Wound Laterality: Left Cleanser: Normal Saline (Generic) 3 x Per Week/30 Days Discharge Instructions: Wash your hands with soap and water. Remove old dressing, discard into plastic bag and place into trash. Cleanse the wound with Normal Saline prior to applying a clean dressing using gauze sponges, not tissues or cotton balls. Do not scrub or use excessive force. Pat dry using gauze sponges, not tissue or cotton balls. Primary Dressing: Hydrofera Blue Ready Transfer Foam, 2.5x2.5 (in/in) 3 x Per Week/30 Days Discharge Instructions: Apply Hydrofera Blue Ready to wound bed as  directed Secondary Dressing: Mepilex Border Flex, 4x4 (in/in) (Generic) 3 x Per Week/30 Days Discharge Instructions: Apply to wound as directed. Do not cut. 1. Would recommend currently that we go ahead and switch to [facility] dressing I think this would be a better way to go at this point. 2. I am also can recommend that we have the patient go ahead and continue with the border foam dressing to cover which I think is still good to provide additional protection. 3. I am also can recommend that we have the patient look for any evidence of anything worsening at the facility or rather the facility should keep an eye on this. If anything is noted they should address this is soon as possible. We will see patient back for reevaluation in 1 week here in the clinic. If anything worsens or changes patient will contact our office for additional recommendations. Electronic Signature(s) Signed: 04/30/2021 3:46:52 PM By: Lenda KelpStone III, [name] Belding PA-C Entered By: Lenda KelpStone III, Evalise Abruzzese on 04/30/2021 15:46:52 [name] HiltsSCHENK, Hassan A. ([id number]) -------------------------------------------------------------------------------- SuperBill Details Patient Name: [name] HiltsSCHENK, [name] Date of Service: 04/30/2021 Medical Record Number: [id number] Patient Account Number: [id number] Date of Birth/Sex: 03/23/1931 [age] y.o. M) Treating RN: Hansel FeinsteinBishop, Joy Primary Care Provider: Marcelino DusterJohnston, John Other Clinician: Referring Provider: Marcelino DusterJohnston, John Treating Provider/Extender: Rowan BlaseStone, Rondalyn Belford Weeks in Treatment: 27 Diagnosis Coding ICD-10 Codes Code Description [phone] Pressure ulcer of sacral region, stage 2 Z79.01 Long term (current) use of anticoagulants Z95.0 Presence of cardiac pacemaker I10 Essential (primary) hypertension I50.42 Chronic combined systolic (congestive) and diastolic (congestive) heart failure I25.10 Atherosclerotic heart disease of native coronary artery without angina pectoris Facility Procedures CPT4 Code:  [id number] Description: 99213 - WOUND CARE VISIT-LEV 3 EST PT Modifier: Quantity: 1 Physician Procedures CPT4 Code: [id number] Description: 99213 - WC PHYS LEVEL 3 - EST PT Modifier: Quantity: 1 CPT4 Code: Description: ICD-10 Diagnosis Description L89.152 Pressure ulcer of sacral region, stage 2 Z79.01 Long term (current) use of anticoagulants Z95.0 Presence of cardiac pacemaker I10 Essential (primary) hypertension Modifier: Quantity: Electronic Signature(s) Signed: 04/30/2021 3:47:17 PM By: Lenda KelpStone III, Pariss Hommes PA-C Previous Signature: 04/30/2021 3:39:51 PM Version By: Hansel FeinsteinBishop, Joy Entered By: Lenda KelpStone III, Brenn Gatton on 04/30/2021 15:47:16

## 2021-04-30 NOTE — Progress Notes (Signed)
CARTEL, MAUSS ([id number]) Visit Report for 04/30/2021 Arrival Information Details Patient Name: [name], [name]. Date of Service: 04/30/2021 9:45 AM Medical Record Number: [id number] Patient Account Number: [id number] Date of Birth/Sex: Jun 07, 1931 (87 y.o. M) Treating RN: Hansel Feinstein Primary Care Ashleigh Luckow: [name] Other Clinician: Referring Michaeleen Down: [name] Treating Jaquilla Woodroof/Extender: Rowan Blase in Treatment: 27 Visit Information History Since Last Visit Added or deleted any medications: No Patient Arrived: Wheel Chair Had [name] fall or experienced change in Yes Arrival Time: 09:57 activities of daily living that may affect Accompanied By: daughter risk of falls: Transfer Assistance: None Hospitalized since last visit: No Patient Identification Verified: Yes Pain Present Now: Yes Secondary Verification Process Completed: Yes Patient Requires Transmission-Based Precautions: No Patient Has Alerts: No Electronic Signature(s) Signed: 04/30/2021 3:36:07 PM By: Lolita Cram Entered By: Lolita Cram on 04/30/2021 09:59:05 [name]. ([id number]) -------------------------------------------------------------------------------- Clinic Level of Care Assessment Details Patient Name: [name]. Date of Service: 04/30/2021 9:45 AM Medical Record Number: [id number] Patient Account Number: [id number] Date of Birth/Sex: [birth year] (87 y.o. M) Treating RN: Hansel Feinstein Primary Care Rashad Obeid: [name] Other Clinician: Referring Jurrell Royster: [name] Treating Suellen Durocher/Extender: Rowan Blase in Treatment: 27 Clinic Level of Care Assessment Items TOOL 4 Quantity Score []  - Use when only an EandM is performed on FOLLOW-UP visit 0 ASSESSMENTS - Nursing Assessment / Reassessment []  - Reassessment of Co-morbidities (includes updates in patient status) 0 []  - 0 Reassessment of Adherence to Treatment Plan ASSESSMENTS - Wound and Skin Assessment /  Reassessment []  - Simple Wound Assessment / Reassessment - one wound 0 X- 2 5 Complex Wound Assessment / Reassessment - multiple wounds []  - 0 Dermatologic / Skin Assessment (not related to wound area) ASSESSMENTS - Focused Assessment []  - Circumferential Edema Measurements - multi extremities 0 []  - 0 Nutritional Assessment / Counseling / Intervention []  - 0 Lower Extremity Assessment (monofilament, tuning fork, pulses) []  - 0 Peripheral Arterial Disease Assessment (using hand held doppler) ASSESSMENTS - Ostomy and/or Continence Assessment and Care []  - Incontinence Assessment and Management 0 []  - 0 Ostomy Care Assessment and Management (repouching, etc.) PROCESS - Coordination of Care X - Simple Patient / Family Education for ongoing care 1 15 []  - 0 Complex (extensive) Patient / Family Education for ongoing care []  - 0 Staff obtains , Records, Test Results / Process Orders []  - 0 Staff telephones HHA, Nursing Homes / Clarify orders / etc []  - 0 Routine Transfer to another Facility (non-emergent condition) []  - 0 Routine Hospital Admission (non-emergent condition) []  - 0 New Admissions / / Ordering NPWT, Apligraf, etc. []  - 0 Emergency Hospital Admission (emergent condition) X- 1 10 Simple Discharge Coordination []  - 0 Complex (extensive) Discharge Coordination PROCESS - Special Needs []  - Pediatric / Minor Patient Management 0 []  - 0 Isolation Patient Management []  - 0 Hearing / Language / Visual special needs []  - 0 Assessment of Community assistance (transportation, D/C planning, etc.) []  - 0 Additional assistance / Altered mentation []  - 0 Support Surface(s) Assessment (bed, cushion, seat, etc.) INTERVENTIONS - Wound Cleansing / Measurement Rathert, Jaxan [name]. ( ) []  - 0 Simple Wound Cleansing - one wound X- 2 5 Complex Wound Cleansing - multiple wounds []  - 0 Wound Imaging (photographs - any number of wounds) []   - 0 Wound Tracing (instead of photographs) []  - 0 Simple Wound Measurement - one wound X- 2 5 Complex Wound Measurement - multiple wounds INTERVENTIONS - Wound Dressings []  -  Small Wound Dressing one or multiple wounds 0 X- 2 15 Medium Wound Dressing one or multiple wounds []  - 0 Large Wound Dressing one or multiple wounds []  - 0 Application of Medications - topical []  - 0 Application of Medications - injection INTERVENTIONS - Miscellaneous []  - External ear exam 0 []  - 0 Specimen Collection (cultures, biopsies, blood, body fluids, etc.) []  - 0 Specimen(s) / Culture(s) sent or taken to Lab for analysis []  - 0 Patient Transfer (multiple staff / / Similar devices) []  - 0 Simple Staple / Suture removal (25 or less) []  - 0 Complex Staple / Suture removal (26 or more) []  - 0 Hypo / Hyperglycemic Management (close monitor of Blood Glucose) []  - 0 Ankle / Brachial Index (ABI) - do not check if billed separately X- 1 5 Vital Signs Has the patient been seen at the hospital within the last three years: Yes Total Score: 90 Level Of Care: New/Established - Level 3 Electronic Signature(s) Signed: 04/30/2021 3:39:51 PM By: Entered By: on 04/30/2021 10:42:58 Schaum, Kejon [name]. ( ) -------------------------------------------------------------------------------- Lower Extremity Assessment Details Patient Name: [name]. Date of Service: 04/30/2021 9:45 AM Medical Record Number: Patient Account Number: Date of Birth/Sex: 07-29-1931 (87 y.o. M) Treating RN: 06/30/2021 Primary Care Tamy Accardo: Hansel Feinstein Other Clinician: Referring Cameka Rae: Hansel Feinstein Treating Jarome Trull/Extender: 06/30/2021 Weeks in Treatment: 27 Electronic Signature(s) Signed: 04/30/2021 3:36:07 PM By: [name] Hilts Signed: 04/30/2021 3:39:51 PM By: [id number] Entered By: [id number] on 04/30/2021 10:11:56 [name].  (06-11-1977) -------------------------------------------------------------------------------- Multi Wound Chart Details Patient Name: Hansel Feinstein [name]. Date of Service: 04/30/2021 9:45 AM Medical Record Number: [name] Patient Account Number: Allen Derry Date of Birth/Sex: 1931/05/30 (87 y.o. M) Treating RN: 06/30/2021 Primary Care Bliss Tsang: Hansel Feinstein Other Clinician: Referring Tawonna Esquer: Lolita Cram Treating Brock Mokry/Extender: 06/30/2021 in Treatment: 27 Vital Signs Height(in): Pulse(bpm): 79 Weight(lbs): Blood Pressure(mmHg): 124/74 Body Mass Index(BMI): Temperature(F): 97.6 Respiratory Rate(breaths/min): 16 Photos: [N/[name]:N/[name]] Wound Location: Sacrum Left Gluteus N/[name] Wounding Event: Pressure Injury Shear/Friction N/[name] Primary Etiology: Pressure Ulcer Pressure Ulcer N/[name] Comorbid History: Cataracts, Sleep Apnea, Congestive Cataracts, Sleep Apnea, Congestive N/[name] Heart Failure, Coronary Artery Heart Failure, Coronary Artery Disease, Hypertension, Disease, Hypertension, Osteoarthritis Osteoarthritis Date Acquired: 01/18/2021 03/31/2021 N/[name] Weeks of Treatment: 13 4 N/[name] Wound Status: Open Open N/[name] Measurements L x W x D (cm) 0.4x0.4x0.1 0.5x0.5x0.1 N/[name] Area (cm) : 0.126 0.196 N/[name] Volume (cm) : 0.013 0.02 N/[name] % Reduction in Area: 61.80% -176.10% N/[name] % Reduction in Volume: 86.90% -185.70% N/[name] Classification: Category/Stage II Category/Stage II N/[name] Exudate Amount: Small Medium N/[name] Exudate Type: Serous Serous N/[name] Exudate Color: amber amber N/[name] Wound Margin: Flat and Intact Flat and Intact N/[name] Granulation Amount: Large (67-100%) Large (67-100%) N/[name] Granulation Quality: Red, Pink Red, Pink N/[name] Necrotic Amount: Small (1-33%) Small (1-33%) N/[name] Exposed Structures: Fat Layer (Subcutaneous Tissue): Fat Layer (Subcutaneous Tissue): N/[name] Yes Yes Fascia: No Fascia: No Tendon: No Tendon: No Muscle: No Muscle: No Joint: No Joint: No Bone: No Bone: No Epithelialization:  None None N/[name] Treatment Notes Electronic Signature(s) Signed: 04/30/2021 3:39:51 PM By: [id number] Entered By: 09/14/1931 on 04/30/2021 10:35:49 Hansel Feinstein ([name]) -------------------------------------------------------------------------------- Multi-Disciplinary Care Plan Details Patient Name: [name] [name]. Date of Service: 04/30/2021 9:45 AM Medical Record Number: [id number] Patient Account Number: 01/20/2021 Date of Birth/Sex: 02/19/1931 (87 y.o. M) Treating RN: Hansel Feinstein Primary Care Willene Holian: Hansel Feinstein Other Clinician: Referring Kate Sweetman: 06/30/2021 Treating Brihana Quickel/Extender: Joellyn Quails  Weeks in Treatment: 27 Active Inactive Electronic Signature(s) Signed: 04/30/2021 3:39:51 PM By: Hansel FeinsteinBishop, Joy Entered By: Hansel FeinsteinBishop, Joy on 04/30/2021 10:34:45 [name] HiltsSCHENK, Alexiz [name]. ([id number]) -------------------------------------------------------------------------------- Pain Assessment Details Patient Name: [name] HiltsSCHENK, Nathanyl [name]. Date of Service: 04/30/2021 9:45 AM Medical Record Number: [id number] Patient Account Number: [id number] Date of Birth/Sex: 07/23/1931 (87 y.o. M) Treating RN: Hansel FeinsteinBishop, Joy Primary Care Schawn Byas: Marcelino DusterJohnston, John Other Clinician: Referring Sherle Mello: Marcelino DusterJohnston, John Treating Srija Southard/Extender: Rowan BlaseStone, Hoyt Weeks in Treatment: 27 Active Problems Location of Pain Severity and Description of Pain Patient Has Paino Yes Site Locations Pain Location: Generalized Pain Rate the pain. Current Pain Level: 8 Pain Management and Medication Current Pain Management: Electronic Signature(s) Signed: 04/30/2021 3:36:07 PM By: Lolita CramBurnette, Kyara Signed: 04/30/2021 3:39:51 PM By: Hansel FeinsteinBishop, Joy Entered By: Lolita CramBurnette, Kyara on 04/30/2021 09:59:54 [name] HiltsSCHENK, Dalten [name]. ([id number]) -------------------------------------------------------------------------------- Patient/Caregiver Education Details Patient Name: [name] HiltsSCHENK, Izack [name]. Date of Service: 04/30/2021 9:45 AM Medical Record Number:  [id number] Patient Account Number: [id number] Date of Birth/Gender: 07/23/1931 (87 y.o. M) Treating RN: Hansel FeinsteinBishop, Joy Primary Care Physician: Marcelino DusterJohnston, John Other Clinician: Referring Physician: Marcelino DusterJohnston, John Treating Physician/Extender: Rowan BlaseStone, Hoyt Weeks in Treatment: 27 Education Assessment Education Provided To: Patient and Caregiver Education Topics Provided Offloading: Methods: Demonstration, Explain/Verbal Responses: State content correctly Wound/Skin Impairment: Electronic Signature(s) Signed: 04/30/2021 3:39:51 PM By: Hansel FeinsteinBishop, Joy Entered By: Hansel FeinsteinBishop, Joy on 04/30/2021 10:38:59 Rottinghaus, Colleen [name]. ([id number]) -------------------------------------------------------------------------------- Wound Assessment Details Patient Name: [name] HiltsSCHENK, Mervin [name]. Date of Service: 04/30/2021 9:45 AM Medical Record Number: [id number] Patient Account Number: [id number] Date of Birth/Sex: 07/23/1931 (87 y.o. M) Treating RN: Hansel FeinsteinBishop, Joy Primary Care Kyre Jeffries: Marcelino DusterJohnston, John Other Clinician: Referring Breasia Karges: Marcelino DusterJohnston, John Treating Ona Rathert/Extender: Allen DerryStone, Hoyt Weeks in Treatment: 27 Wound Status Wound Number: 12 Primary Pressure Ulcer Etiology: Wound Location: Sacrum Wound Open Wounding Event: Pressure Injury Status: Date Acquired: 01/18/2021 Comorbid Cataracts, Sleep Apnea, Congestive Heart Failure, Weeks Of Treatment: 13 History: Coronary Artery Disease, Hypertension, Osteoarthritis Clustered Wound: No Photos Wound Measurements Length: (cm) 0.4 Width: (cm) 0.4 Depth: (cm) 0.1 Area: (cm) 0.126 Volume: (cm) 0.013 % Reduction in Area: 61.8% % Reduction in Volume: 86.9% Epithelialization: None Wound Description Classification: Category/Stage II Wound Margin: Flat and Intact Exudate Amount: Small Exudate Type: Serous Exudate Color: amber Foul Odor After Cleansing: No Slough/Fibrino Yes Wound Bed Granulation Amount: Large (67-100%) Exposed Structure Granulation Quality: Red,  Pink Fascia Exposed: No Necrotic Amount: Small (1-33%) Fat Layer (Subcutaneous Tissue) Exposed: Yes Necrotic Quality: Adherent Slough Tendon Exposed: No Muscle Exposed: No Joint Exposed: No Bone Exposed: No Electronic Signature(s) Signed: 04/30/2021 3:36:07 PM By: Lolita CramBurnette, Kyara Signed: 04/30/2021 3:39:51 PM By: Hansel FeinsteinBishop, Joy Entered By: Lolita CramBurnette, Kyara on 04/30/2021 10:11:08 Snedeker, Trampus [name]. ([id number]) -------------------------------------------------------------------------------- Wound Assessment Details Patient Name: [name] HiltsSCHENK, Tabias [name]. Date of Service: 04/30/2021 9:45 AM Medical Record Number: [id number] Patient Account Number: [id number] Date of Birth/Sex: 07/23/1931 (87 y.o. M) Treating RN: Hansel FeinsteinBishop, Joy Primary Care Garlin Batdorf: Marcelino DusterJohnston, John Other Clinician: Referring Alison Breeding: Marcelino DusterJohnston, John Treating Patriece Archbold/Extender: Allen DerryStone, Hoyt Weeks in Treatment: 27 Wound Status Wound Number: 13 Primary Pressure Ulcer Etiology: Wound Location: Left Gluteus Wound Open Wounding Event: Shear/Friction Status: Date Acquired: 03/31/2021 Comorbid Cataracts, Sleep Apnea, Congestive Heart Failure, Weeks Of Treatment: 4 History: Coronary Artery Disease, Hypertension, Osteoarthritis Clustered Wound: No Photos Wound Measurements Length: (cm) 0.5 Width: (cm) 0.5 Depth: (cm) 0.1 Area: (cm) 0.196 Volume: (cm) 0.02 % Reduction in Area: -176.1% % Reduction in Volume: -185.7% Epithelialization: None Tunneling: No Undermining: No Wound Description Classification: Category/Stage II Wound Margin: Flat and Intact Exudate Amount: Medium Exudate Type: Serous  Exudate Color: amber Foul Odor After Cleansing: No Slough/Fibrino Yes Wound Bed Granulation Amount: Large (67-100%) Exposed Structure Granulation Quality: Red, Pink Fascia Exposed: No Necrotic Amount: Small (1-33%) Fat Layer (Subcutaneous Tissue) Exposed: Yes Necrotic Quality: Adherent Slough Tendon Exposed: No Muscle Exposed: No Joint  Exposed: No Bone Exposed: No Electronic Signature(s) Signed: 04/30/2021 3:36:07 PM By: Lolita Cram Signed: 04/30/2021 3:39:51 PM By: Hansel Feinstein Entered By: Lolita Cram on 04/30/2021 10:11:43 Terriquez, Kodey [name]. ([id number]) -------------------------------------------------------------------------------- Vitals Details Patient Name: [name]. Date of Service: 04/30/2021 9:45 AM Medical Record Number: [id number] Patient Account Number: [id number] Date of Birth/Sex: [birth year] (87 y.o. M) Treating RN: Hansel Feinstein Primary Care Jasmain Ahlberg: [name] Other Clinician: Referring Carisha Kantor: [name] Treating Mattisyn Cardona/Extender: Rowan Blase in Treatment: 27 Vital Signs Time Taken: 09:55 Temperature (F): 97.6 Pulse (bpm): 79 Respiratory Rate (breaths/min): 16 Blood Pressure (mmHg): 124/74 Reference Range: 80 - 120 mg / dl Electronic Signature(s) Signed: 04/30/2021 3:36:07 PM By: Lolita Cram Entered By: Lolita Cram on 04/30/2021 09:59:44

## 2021-05-04 ENCOUNTER — Other Ambulatory Visit: Payer: Self-pay

## 2021-05-04 ENCOUNTER — Non-Acute Institutional Stay: Payer: Medicare Other | Admitting: Adult Health Nurse Practitioner

## 2021-05-04 VITALS — HR 64 | Wt 162.3 lb

## 2021-05-04 DIAGNOSIS — L89159 Pressure ulcer of sacral region, unspecified stage: Secondary | ICD-10-CM

## 2021-05-04 DIAGNOSIS — B370 Candidal stomatitis: Secondary | ICD-10-CM

## 2021-05-04 DIAGNOSIS — I5032 Chronic diastolic (congestive) heart failure: Secondary | ICD-10-CM

## 2021-05-04 DIAGNOSIS — Z515 Encounter for palliative care: Secondary | ICD-10-CM

## 2021-05-04 DIAGNOSIS — E46 Unspecified protein-calorie malnutrition: Secondary | ICD-10-CM

## 2021-05-04 NOTE — Progress Notes (Signed)
[facility] Palliative Care Consult Note Telephone: [phone]  Fax: [phone]    Date of encounter: 05/04/21 PATIENT NAME: [name] [address] [place] [address]   [phone] (home)  DOB: [birth year] MRN: [id number] PRIMARY CARE PROVIDER:    Dr. Frazier Richards  REFERRING PROVIDER:   Dr. Frazier Richards  RESPONSIBLE PARTY:    Contact Information     Name Relation Home Work Mobile   Kerrie [place] Daughter [phone]     Troxler,Suzanne Daughter [phone]  [phone]        I met face to face with patient in facility. Palliative Care was asked to follow this patient by consultation request of  Dr. Frazier Richards to address advance care planning and complex medical decision making. This is a follow up visit.  Called daughter, [name], to update on today's visit                                   ASSESSMENT AND PLAN / RECOMMENDATIONS:   Advance Care Planning/Goals of Care: Goals include to maximize quality of life and symptom management.  CODE STATUS: DNR  Symptom Management/Plan:  CHF: This seems stable at this time and patient has no edema or increased shortness of breath today.  Staff does report that he is not needed supplemental oxygen in over a week.  Continue supportive care at facility  [facility]: Patient is to start Remeron today.  Have tried supplemental drinks in the past and this is caused diarrhea.  We will continue to monitor weights  Pressure injury: Patient is being followed at wound clinic.  Staff does report that wound has not improved but has not worsened either.  Continue follow-up and recommendations at wound clinic  [place]: Lab communication for Dr. Ouida Sills for recommendation for nystatin solution.  Patient is on Coumadin and would like Dr. Tonette Bihari input as this may require more monitoring while he is on an antifungal.  Have discussed hospice services with patient and he has  declined hospice services at this time I would like to see if the Remeron will help improve his appetite.  Also discussed hospice with his daughter.  She is in agreement with her father being reevaluated in a couple of weeks to see if the Remeron has made any improvement in his appetite.  Will reevaluate in 2 weeks.   Follow up Palliative Care Visit: Palliative care will continue to follow for complex medical decision making, advance care planning, and clarification of goals. Return 2 weeks or prn. Daughter encouraged to call with any questions or concerns  I spent 45 minutes providing this consultation. More than 50% of the time in this consultation was spent in counseling and care coordination.  PPS: 40-30%  HOSPICE ELIGIBILITY/DIAGNOSIS: TBD  Chief Complaint: follow up palliative visit  HISTORY OF PRESENT ILLNESS:  [name] is a 87 y.o. year old male  with aortic stenosis w/valve replacement (on coumadin), CHF, HTN, CAD, venous stasis .  Patient is having continued weight loss and has lost another 5 pounds over the past 2 weeks.  2 weeks ago he weighed 167.9 pounds and now weighs 162.3 pounds.  Patient will be starting Remeron today.  Patient is complaining of sores in his mouth today.  States that it does hurt when he eats and when he brushes his teeth.  Staff has reported that patient is getting weaker and today is the  first day that they have actually had to use the left with him for transfers.  History obtained from review of EMR and interview with family, facility staffand [name].     Physical Exam:  Constitutional: NAD General: frail appearing, thin/WNWD/obese  EYES: anicteric sclera, lids intact, no discharge  ENMT: intact hearing, oral mucous membranes moist, does have white patches to right side of back of throat and to roof of mouth. CV: S1S2, RRR, no LE edema Pulmonary: LCTA, no increased work of breathing, no cough Abdomen:  normo-active BS + 4 quadrants,  soft and non tender MSK: Patient is requiring lift for transfer today Skin: warm and dry, no rashes on visible skin Neuro:  A and O x 3 Hem/lymph/immuno: Does have widespread bruising related to being on Coumadin   Thank you for the opportunity to participate in the care of [name].  The palliative care team will continue to follow. Please call our office at [phone] if we can be of additional assistance.   Sammye Staff Jenetta Downer, NP , DNP  This chart was dictated using voice recognition software.  Despite best efforts to proofread,  errors can occur which can change the documentation meaning.   COVID-19 PATIENT SCREENING TOOL Asked and negative response unless otherwise noted:   Have you had symptoms of covid, tested positive or been in contact with someone with symptoms/positive test in the past 5-10 days?

## 2021-05-07 ENCOUNTER — Other Ambulatory Visit
Admission: RE | Admit: 2021-05-07 | Discharge: 2021-05-07 | Disposition: A | Discharge status: Home / Self Care | Payer: Medicare Other | Source: Ambulatory Visit | Attending: Internal Medicine | Admitting: Internal Medicine

## 2021-05-07 DIAGNOSIS — I4891 Unspecified atrial fibrillation: Secondary | ICD-10-CM | POA: Insufficient documentation

## 2021-05-07 DIAGNOSIS — Z7901 Long term (current) use of anticoagulants: Secondary | ICD-10-CM | POA: Diagnosis present

## 2021-05-07 LAB — PROTIME-INR
INR: 4.7 (ref 0.8–1.2)
Prothrombin Time: 44 seconds — ABNORMAL HIGH (ref 11.4–15.2)

## 2021-05-14 ENCOUNTER — Other Ambulatory Visit: Payer: Self-pay

## 2021-05-14 ENCOUNTER — Encounter: Payer: Medicare Other | Admitting: Physician Assistant

## 2021-05-14 DIAGNOSIS — L97822 Non-pressure chronic ulcer of other part of left lower leg with fat layer exposed: Secondary | ICD-10-CM | POA: Diagnosis not present

## 2021-05-14 NOTE — Progress Notes (Addendum)
[name], [name] ([id number]) Visit Report for 05/14/2021 Chief Complaint Document Details Patient Name: [name], [name] Date of Service: 05/14/2021 10:45 AM Medical Record Number: [id number] Patient Account Number: [id number] Date of Birth/Sex: Oct 16, 1931 (87 y.o. M) Treating RN: Hansel Feinstein Primary Care Provider: [name] Other Clinician: Referring Provider: [name] Treating Provider/Extender: Rowan Blase in Treatment: 29 Information Obtained from: Patient Chief Complaint Sacral and Left Gluteal Ulcers Electronic Signature(s) Signed: 05/14/2021 11:39:56 AM By: Lenda Kelp PA-C Entered By: Lenda Kelp on 05/14/2021 11:39:56 [name] Hilts A. ([id number]) -------------------------------------------------------------------------------- HPI Details Patient Name: [name] Hilts A. Date of Service: 05/14/2021 10:45 AM Medical Record Number: [id number] Patient Account Number: [id number] Date of Birth/Sex: 22-Sep-1931 (87 y.o. M) Treating RN: Hansel Feinstein Primary Care Provider: [name] Other Clinician: Referring Provider: [name] Treating Provider/Extender: Rowan Blase in Treatment: 29 History of Present Illness HPI Description: 04/21/2019 ADMISSION This is an independent 87 year old man who lives in the independent part of [address] of [place]. He has a history of chronic lower extremity edema and wears compression stockings. He states about a month ago he was taking off the one on the right and pulled some skin off accidentally. He has had 2 small wounds on the right anterior and right medial lower extremity. I note looking through [facility] health link that he had multiple venous ultrasounds in 2015 and 16. These were DVT rule outs. He did have a Baker's cyst on the left. He has not not had a previous wound history although he did have a history of cellulitis in his legs. Past medical history; includes aortic stenosis status post mechanical AVR in  2007 on chronic Coumadin, lower extremity edema with a history of cellulitis, history of MRSA. ABIs in our clinic were 1.1 on the right 6/3; patient with predominantly venous insufficiency ulcers in the right lower leg probably some degree of lymphedema. He had to wounds last week. We put him in 3 layer compression. The nurses at [facility] are changing the dressing. The area laterally is healed but he still has a small very painful area on the anterior tibial area. He is on Coumadin because of mechanical aortic valve. 6/10; venous insufficiency ulcers on the right lower leg. He has some degree of lymphedema. We have been using 3 layer compression with silver collagen small wound. Dimensions are improved. I gave him doxycycline last week which he tolerated because of surrounding erythema. This is improved also 6/17; arrives in clinic today with portable silver collagen dressing tightly adherent to the wound. Also felt that the wrap was too tight 3 layer being changed at the [facility] 6/24; patient's wound is small still adherent debris over the surface however even under illumination it was hard to see what was still open here. We have been using silver collagen 7/1; the patient arrives in the clinic today and the area on the right lower leg is healed. He has severe chronic venous insufficiency. He has 20/30 compression stockings. Readmission: 06/22/2020 on evaluation today patient presents for reevaluation here in our clinic although it has been a little over a year since we last saw him. He does have a history of lymphedema, venous insufficiency, anticoagulant therapy, he is on a pacemaker which is the reason for the anticoagulants, hypertension, and congestive heart failure. With that being said unfortunately he has been dealing with a wound of the left and right legs which has been giving him some trouble since arrival first 2021. That is  in regard to the left leg ulcer. In  regard to the right leg ulcer this is just a very small area that really I think will seal up quite nicely is more of the lymphedema opening than anything else. With that being said unfortunately the left leg is quite significant. We actually have noted that the patient's been dealing with this for quite some time and I think that there is a chance we may want to consider biopsy. With that being said he is on Coumadin therefore we were not able to perform a biopsy at this point. I think that something however in the future that may be warranted we may just have to take him off of the current Coumadin regimen in order to do this. We discussed doing it today but I am more concerned about the fact that he could have issues with uncontrolled bleeding and in that case we may have to send him to the ER. His daughter was not wanting to take that chance which I can completely understand. Nonetheless so far they have been using antibiotic ointment over the area I am just putting a protective dressing I do believe the patient is that he needs some type of compression. 07/07/2020 on evaluation today patient appears to be doing poorly in regard to his bilateral lower extremities today. He actually went to urgent care last night due to having pain in his legs due to the wraps. It appears they got somewhat tight on him because of the fact that he is having such significant swelling. He appears to be fluid overloaded I do not see any signs of infection actively at this point which is good news but unfortunately I think that this is something that may need to be addressed even by cardiology more so than just with a different or alternate type of wrap. I will contact his cardiologist, Dr. Mariah Milling, as well. 07/10/2020 upon evaluation today patient unfortunately appears to be doing worse even than when I saw him on Friday from the standpoint of where his legs stand. The main issue is that he has increased erythema although  there is still not warm to touch I am concerned about infection and about this getting significantly worse. Again my initial concern was more fluid overload and I discussed this with his cardiology office on Friday. With that being said I feel like this has gotten worse his daughter is in agreement and I think that the best option would be to send him to the hospital at this point. READMISSION 09/06/2020 Is a patient who has had 2 stays in this clinic firstly in 2020 and more recently from 06/02/2020 through 07/10/2020. He was cared for by [facility]. When he was last here he had bilateral lower extremity ulcers in the setting of chronic venous insufficiency with lymphedema. When he we last saw him he had weeping edema fluido Cellulitis and he was sent to the hospital. He was admitted initially from 06/30/2020 through 07/14/2020 with cellulitis plus stasis dermatitis. Treated empirically with vancomycin and had increases in his torsemide. He was then sent to the skilled level of [facility] but readmitted to hospital from 07/29/2020 through 08/10/2020 with blood loss anemia hemoglobin of 4 and OB positive stools. During this hospitalization he was noted to have a sacral wound at stage II. He was sent to [facility] [place] [facility] again in the skilled level. He had compression on his legs with Coban. He is now back at his independent living setting.  He has not been wearing compression and his daughter feels there is already increasing swelling. To have stockings at home not exactly sure at what strength. [name] HiltsSCHENK, Ramadan A. ([id number]) Past medical history includes lymphedema, chronic venous insufficiency, pacemaker, congestive heart failure, hypertension, aortic valve stenosis status post mechanical valve replacement in 2002 on chronic Coumadin, chronic kidney disease stage IIIb, atrial fibrillation, peripheral neuropathy, large hiatal hernia. We did not test his ABIs in the clinic  today. Readmission: 10/17/2020 on evaluation today patient appears to be doing somewhat poorly in regard to his left leg and the fact that he does have an open wound at this point. He has not had the compression socks placed at the facility they have not been putting them on at all unfortunately. He does not have on the right leg currently and apparently there is significant put him on the left leg with the wound which I understand is that can be somewhat tight obviously. Nonetheless I do believe the right leg needs to have a compression sock and I do believe on the left side right now we can do something a little better in order to try to get the wound healed at this point. The patient is on torsemide and currently allopurinol for gout. He also has daily weights due to congestive heart failure I am assuming in order to monitor his fluid status overall. He is currently a DNR at the facility. 10/30/2020 upon evaluation today patient appears to be doing somewhat poorly at this time in regard to his bilateral lower extremities. His left leg which is the only place where he had a wound last week when I saw him is actually doing worse with new wound openings. The right leg also has a small wound as well. However the most concerning thing is that last week he did not have any signs of infection or erythema there is no warmth to touch over the leg. This week the entire leg is more sore he also has erythema extending from the ankle to just below the knee with the area being erythematous and warm to touch which I believe represents cellulitis at minimum. I am concerned about how this is spread so quickly and the fact that he is having increased pain and also not feeling as well. All in all I discussed with the patient that unfortunately I feel like he may be best served going to the ER for further evaluation of this issue currently to try to see about the potential for IV antibiotics and to have  appropriate lab work performed. His daughter is present during the visit today and she was included in the decision making at this point. 11/13/2020 upon evaluation today patient appears to be doing well with regard to his leg ulcer. Fortunately he does not appear to be showing signs of infection anything like it was previous. This is great news. He was sent home with IV antibiotic therapy that seems to have done extremely well for him. No fevers, chills, nausea, vomiting, or diarrhea. 11/27/2020 upon evaluation today patient appears to be doing quite well in regard to his wounds currently. Fortunately there is no signs of active infection at this time which is great news and I am very pleased in this regard. He did have some concerns or his daughter did about an area on his gluteal region. With that being said I see nothing that is actually open at this point which is great news I do think continuing with a derma  cloud is a good way to go. With regard to his leg he does have a new area on the right leg of open wound locations which is new compared to last visit we will need to address this as well. Otherwise he seems to be doing quite well in my opinion. 12/15/2020 upon evaluation today patient appears to be doing well with regard to his leg ulcer on the left. With that being said he did not have the Tubigrip on which I think is integral to getting this closed as he continues to weep. Subsequently I think the Tubigrip will help control some of the edema which will help him in that regard. Fortunately there is no signs of active infection at this time. No fevers, chills, nausea, vomiting, or diarrhea. 01/01/2021 upon evaluation today patient appears to be doing well with regard to his wound. This is measuring better and actually is significantly smaller. Fortunately there is no signs of active infection at this time. No fevers, chills, nausea, vomiting, or diarrhea. 01/15/2021 on evaluation today patient  appears to be doing well with regard to his left leg ulcer though he still continues to weep quite a bit he did not have the Tubigrip on the day that he supposed to. Fortunately there is no signs of active infection at this time. No fevers, chills, nausea, vomiting, or diarrhea. 01/29/2021 upon evaluation today patient appears to be doing well with regard to his right leg which is completely healed. His left leg though not healed is just a very small open area that is weeping. With regard to his gluteal region this is actually new and in fact he has a wound that is really in the sacral area. This is stated to be a stage II that may be the case there is really no slough buildup I Ernie Hew leave it as such for the time being. We will keep a close eye on things however and see how it proceeds. 02/12/2021 upon evaluation today patient appears to be doing excellent in regard to his leg ulcers. In fact everything appears to be closed as best I can tell at this point. I think he is ready to transition into his compression stocking. With that being said his gluteal region still is open although it appears to be fairly clean I think the collagen is still a good thing to do here. We will see how things continue to improve over the next week or so. 02/26/2021 on evaluation today patient appears to be doing well with regard to his wound. He is tolerating the dressing changes without complication the wound is smaller though I feel like he is at the point where we need to try to do something to dry this up a little bit more I think switching from silver collagen to a silver alginate would be beneficial. 03/16/2021 upon evaluation today patient appears to be doing excellent in regard to his wound. He has been tolerating the dressing changes without complication. Fortunately there is no signs of active infection at this time. No fevers, chills, nausea, vomiting, or diarrhea. 04/02/2021 upon evaluation today patient appears to  be doing well with regard to his wound. He really does not have anything terribly open but nonetheless he still has a lot of redness in the sacral area I think there is still a lot of pressure getting to this region. With that being said I think he really needs to have more appropriate offloading I think that may be the biggest issue  he spends a lot of time sitting in his recliner from what I am hearing. 5/23; there has been some improvement in the sacral area not so much in the area on the left buttock. Both of these very tiny punched-out areas with surrounding erythema. They seem to be tender out of proportion to what you might expect looking at them. We have been using silver alginate on this area 04/30/2021 upon evaluation today patient appears to be doing a little bit more poorly in regard to the wounds in the gluteal region and sacral region. Fortunately there does not appear to be any signs of active infection which is great news but I do feel like there is some additional pressure injury here. I did discuss with the patient again today trying to keep pressure off the area I think this is still of utmost importance. Fortunately there is no signs of active infection systemically which is great news. 05/14/2021 upon evaluation today patient appears to be doing about the same in regard to his ulcer in the sacral region. Fortunately there does Mccalla, Charlis A. ([id number]) not appear to be any signs of active infection which is great news and overall very pleased in that regard. With that being said he does have some bleeding when cleansed with saline and gauze over the central portion of the wound I think this is indicative of the better wound bed which is at least good news. Electronic Signature(s) Signed: 05/14/2021 6:07:29 PM By: Lenda Kelp PA-C Entered By: Lenda Kelp on 05/14/2021 18:07:29 Joellyn Quails  ([id number]) -------------------------------------------------------------------------------- Physical Exam Details Patient Name: [name] Hilts A. Date of Service: 05/14/2021 10:45 AM Medical Record Number: [id number] Patient Account Number: [id number] Date of Birth/Sex: September 24, 1931 (87 y.o. M) Treating RN: Hansel Feinstein Primary Care Provider: [name] Other Clinician: Referring Provider: [name] Treating Provider/Extender: Rowan Blase in Treatment: 29 Constitutional Well-nourished and well-hydrated in no acute distress. Respiratory normal breathing without difficulty. Psychiatric this patient is able to make decisions and demonstrates good insight into disease process. Alert and Oriented x 3. pleasant and cooperative. Notes Patient's wound bed actually does not appear to be significantly smaller but fortunately does not appear to show any signs of infection either and very pleased in that regard. Electronic Signature(s) Signed: 05/14/2021 6:07:42 PM By: Lenda Kelp PA-C Entered By: Lenda Kelp on 05/14/2021 18:07:41 Joellyn Quails ([id number]) -------------------------------------------------------------------------------- Physician Orders Details Patient Name: [name] Hilts A. Date of Service: 05/14/2021 10:45 AM Medical Record Number: [id number] Patient Account Number: [id number] Date of Birth/Sex: 10-07-1931 (87 y.o. M) Treating RN: Hansel Feinstein Primary Care Provider: [name] Other Clinician: Referring Provider: [name] Treating Provider/Extender: Rowan Blase in Treatment: 29 Verbal / Phone Orders: No Diagnosis Coding ICD-10 Coding Code Description L89.152 Pressure ulcer of sacral region, stage 2 Z79.01 Long term (current) use of anticoagulants Z95.0 Presence of cardiac pacemaker I10 Essential (primary) hypertension I50.42 Chronic combined systolic (congestive) and diastolic (congestive) heart failure I25.10 Atherosclerotic  heart disease of native coronary artery without angina pectoris Follow-up Appointments o Return Appointment in 2 weeks. Edema Control - Lymphedema / Segmental Compressive Device / Other Bilateral Lower Extremities o Patient to wear own compression stockings. Remove compression stockings every night before going to bed and put on every morning when getting up. - bilat lower leg o Elevate legs to the level of the heart and pump ankles as often as possible o Elevate leg(s) parallel to the floor when sitting. Off-Loading o Gel wheelchair  cushion - keep gel cushion under him when out of bed at all times o Low air-loss mattress (Group 2) - needs an air mattress to offload pressure off of wounds o Turn and reposition every 2 hours - keep off of sacral wound area/REPOSITION EVERY 2 HOURS FOR WOUND HEALIN Additional Orders / Instructions Wound #12 Sacrum o Other: - KEEP DRESSINGS ON BOTH SACRAL WOUNDS/FOLLOW WOUND CARE ORDERS Wound #13 Left Gluteus o Other: - KEEP DRESSINGS ON BOTH SACRAL WOUNDS/FOLLOW WOUND CARE ORDERS Wound Treatment Wound #12 - Sacrum Cleanser: Normal Saline (Generic) 3 x Per Week/30 Days Discharge Instructions: Wash your hands with soap and water. Remove old dressing, discard into plastic bag and place into trash. Cleanse the wound with Normal Saline prior to applying a clean dressing using gauze sponges, not tissues or cotton balls. Do not scrub or use excessive force. Pat dry using gauze sponges, not tissue or cotton balls. Primary Dressing: Hydrofera Blue Ready Transfer Foam, 2.5x2.5 (in/in) 3 x Per Week/30 Days Discharge Instructions: Apply Hydrofera Blue Ready to wound bed as directed Secondary Dressing: Mepilex Border Flex, 4x4 (in/in) (Generic) 3 x Per Week/30 Days Discharge Instructions: Apply to wound as directed. Do not cut. Wound #13 - Gluteus Wound Laterality: Left Cleanser: Normal Saline (Generic) 3 x Per Week/30 Days Discharge Instructions:  Wash your hands with soap and water. Remove old dressing, discard into plastic bag and place into trash. Cleanse the wound with Normal Saline prior to applying a clean dressing using gauze sponges, not tissues or cotton balls. Do not scrub or use excessive force. Pat dry using gauze sponges, not tissue or cotton balls. Primary Dressing: Hydrofera Blue Ready Transfer Foam, 2.5x2.5 (in/in) 3 x Per Week/30 Days Discharge Instructions: Apply Hydrofera Blue Ready to wound bed as directed [facility] Mcc[facility]ENK, Zaul A. ([id number]) Secondary Dressing: Mepilex Border Flex, 4x4 (in/in) (Generic) 3 x Per Week/30 Days Discharge Instructions: Apply to wound as directed. Do not cut. Electronic Signature(s) Signed: 05/14/2021 3:06:19 PM By: Hansel FeinsteinBishop, Joy Signed: 05/14/2021 6:32:22 PM By: Lenda KelpStone III, Armandina Iman PA-C Entered By: Hansel FeinsteinBishop, Joy on 05/14/2021 11:52:48 [name], Margues A. ([id number]) -------------------------------------------------------------------------------- Problem List Details Patient Name: [name] HiltsSCHENK, Caliber A. Date of Service: 05/14/2021 10:45 AM Medical Record Number: [id number] Patient Account Number: [id number] Date of Birth/Sex: 1931-09-03 (87 y.o. M) Treating RN: Hansel FeinsteinBishop, Joy Primary Care Provider: Marcelino DusterJohnston, John Other Clinician: Referring Provider: Marcelino DusterJohnston, John Treating Provider/Extender: Rowan BlaseStone, Murry Diaz Weeks in Treatment: 29 Active Problems ICD-10 Encounter Code Description Active Date MDM Diagnosis L89.152 Pressure ulcer of sacral region, stage 2 01/29/2021 No Yes Z79.01 Long term (current) use of anticoagulants 10/17/2020 No Yes Z95.0 Presence of cardiac pacemaker 10/17/2020 No Yes I10 Essential (primary) hypertension 10/17/2020 No Yes I50.42 Chronic combined systolic (congestive) and diastolic (congestive) heart 10/17/2020 No Yes failure I25.10 Atherosclerotic heart disease of native coronary artery without angina 10/17/2020 No Yes pectoris Inactive Problems ICD-10 Code Description Active Date  Inactive Date I89.0 Lymphedema, not elsewhere classified 10/17/2020 10/17/2020 I87.2 Venous insufficiency (chronic) (peripheral) 10/17/2020 10/17/2020 L97.822 Non-pressure chronic ulcer of other part of left lower leg with fat layer exposed 10/17/2020 10/17/2020 L97.812 Non-pressure chronic ulcer of other part of right lower leg with fat layer 10/17/2020 10/17/2020 exposed Resolved Problems Electronic Signature(s) Signed: 05/14/2021 11:39:50 AM By: Geni BersStone III, Antonina Deziel PA-C Furuya, Eirik A. ([id number]) Entered By: Lenda KelpStone III, Arcenia Scarbro on 05/14/2021 11:39:50 [name] HiltsSCHENK, Alphonsa A. ([id number]) -------------------------------------------------------------------------------- Progress Note Details Patient Name: [name] HiltsSCHENK, Kidus A. Date of Service: 05/14/2021 10:45 AM Medical Record Number: [id number] Patient Account Number: [id number] Date of Birth/Sex:  [birth year] (87 y.o. M) Treating RN: Hansel Feinstein Primary Care Provider: [name] Other Clinician: Referring Provider: [name] Treating Provider/Extender: Rowan Blase in Treatment: 29 Subjective Chief Complaint Information obtained from Patient Sacral and Left Gluteal Ulcers History of Present Illness (HPI) 04/21/2019 ADMISSION This is an independent 87 year old man who lives in the independent part of [address] of [place]. He has a history of chronic lower extremity edema and wears compression stockings. He states about a month ago he was taking off the one on the right and pulled some skin off accidentally. He has had 2 small wounds on the right anterior and right medial lower extremity. I note looking through [facility] health link that he had multiple venous ultrasounds in 2015 and 16. These were DVT rule outs. He did have a Baker's cyst on the left. He has not not had a previous wound history although he did have a history of cellulitis in his legs. Past medical history; includes aortic stenosis status post mechanical AVR in 2007 on chronic  Coumadin, lower extremity edema with a history of cellulitis, history of MRSA. ABIs in our clinic were 1.1 on the right 6/3; patient with predominantly venous insufficiency ulcers in the right lower leg probably some degree of lymphedema. He had to wounds last week. We put him in 3 layer compression. The nurses at [facility] are changing the dressing. The area laterally is healed but he still has a small very painful area on the anterior tibial area. He is on Coumadin because of mechanical aortic valve. 6/10; venous insufficiency ulcers on the right lower leg. He has some degree of lymphedema. We have been using 3 layer compression with silver collagen small wound. Dimensions are improved. I gave him doxycycline last week which he tolerated because of surrounding erythema. This is improved also 6/17; arrives in clinic today with portable silver collagen dressing tightly adherent to the wound. Also felt that the wrap was too tight 3 layer being changed at the [facility] 6/24; patient's wound is small still adherent debris over the surface however even under illumination it was hard to see what was still open here. We have been using silver collagen 7/1; the patient arrives in the clinic today and the area on the right lower leg is healed. He has severe chronic venous insufficiency. He has 20/30 compression stockings. Readmission: 06/22/2020 on evaluation today patient presents for reevaluation here in our clinic although it has been a little over a year since we last saw him. He does have a history of lymphedema, venous insufficiency, anticoagulant therapy, he is on a pacemaker which is the reason for the anticoagulants, hypertension, and congestive heart failure. With that being said unfortunately he has been dealing with a wound of the left and right legs which has been giving him some trouble since arrival first 2021. That is in regard to the left leg ulcer. In regard to the  right leg ulcer this is just a very small area that really I think will seal up quite nicely is more of the lymphedema opening than anything else. With that being said unfortunately the left leg is quite significant. We actually have noted that the patient's been dealing with this for quite some time and I think that there is a chance we may want to consider biopsy. With that being said he is on Coumadin therefore we were not able to perform a biopsy at this point. I think that something however in the future that  may be warranted we may just have to take him off of the current Coumadin regimen in order to do this. We discussed doing it today but I am more concerned about the fact that he could have issues with uncontrolled bleeding and in that case we may have to send him to the ER. His daughter was not wanting to take that chance which I can completely understand. Nonetheless so far they have been using antibiotic ointment over the area I am just putting a protective dressing I do believe the patient is that he needs some type of compression. 07/07/2020 on evaluation today patient appears to be doing poorly in regard to his bilateral lower extremities today. He actually went to urgent care last night due to having pain in his legs due to the wraps. It appears they got somewhat tight on him because of the fact that he is having such significant swelling. He appears to be fluid overloaded I do not see any signs of infection actively at this point which is good news but unfortunately I think that this is something that may need to be addressed even by cardiology more so than just with a different or alternate type of wrap. I will contact his cardiologist, Dr. Mariah Milling, as well. 07/10/2020 upon evaluation today patient unfortunately appears to be doing worse even than when I saw him on Friday from the standpoint of where his legs stand. The main issue is that he has increased erythema although there is  still not warm to touch I am concerned about infection and about this getting significantly worse. Again my initial concern was more fluid overload and I discussed this with his cardiology office on Friday. With that being said I feel like this has gotten worse his daughter is in agreement and I think that the best option would be to send him to the hospital at this point. READMISSION 09/06/2020 Is a patient who has had 2 stays in this clinic firstly in 2020 and more recently from 06/02/2020 through 07/10/2020. He was cared for by [facility]. When he was last here he had bilateral lower extremity ulcers in the setting of chronic venous insufficiency with lymphedema. When he we last saw him he had weeping edema fluido Cellulitis and he was sent to the hospital. He was admitted initially from 06/30/2020 through 07/14/2020 [facility], Coda A. ([id number]) with cellulitis plus stasis dermatitis. Treated empirically with vancomycin and had increases in his torsemide. He was then sent to the skilled level of [facility] but readmitted to hospital from 07/29/2020 through 08/10/2020 with blood loss anemia hemoglobin of 4 and OB positive stools. During this hospitalization he was noted to have a sacral wound at stage II. He was sent to [facility] again in the skilled level. He had compression on his legs with Coban. He is now back at his independent living setting. He has not been wearing compression and his daughter feels there is already increasing swelling. To have stockings at home not exactly sure at what strength. Past medical history includes lymphedema, chronic venous insufficiency, pacemaker, congestive heart failure, hypertension, aortic valve stenosis status post mechanical valve replacement in 2002 on chronic Coumadin, chronic kidney disease stage IIIb, atrial fibrillation, peripheral neuropathy, large hiatal hernia. We did not test his ABIs in the clinic today. Readmission: 10/17/2020  on evaluation today patient appears to be doing somewhat poorly in regard to his left leg and the fact that he does have an open wound at this  point. He has not had the compression socks placed at the facility they have not been putting them on at all unfortunately. He does not have on the right leg currently and apparently there is significant put him on the left leg with the wound which I understand is that can be somewhat tight obviously. Nonetheless I do believe the right leg needs to have a compression sock and I do believe on the left side right now we can do something a little better in order to try to get the wound healed at this point. The patient is on torsemide and currently allopurinol for gout. He also has daily weights due to congestive heart failure I am assuming in order to monitor his fluid status overall. He is currently a DNR at the facility. 10/30/2020 upon evaluation today patient appears to be doing somewhat poorly at this time in regard to his bilateral lower extremities. His left leg which is the only place where he had a wound last week when I saw him is actually doing worse with new wound openings. The right leg also has a small wound as well. However the most concerning thing is that last week he did not have any signs of infection or erythema there is no warmth to touch over the leg. This week the entire leg is more sore he also has erythema extending from the ankle to just below the knee with the area being erythematous and warm to touch which I believe represents cellulitis at minimum. I am concerned about how this is spread so quickly and the fact that he is having increased pain and also not feeling as well. All in all I discussed with the patient that unfortunately I feel like he may be best served going to the ER for further evaluation of this issue currently to try to see about the potential for IV antibiotics and to have appropriate lab work performed. His daughter is  present during the visit today and she was included in the decision making at this point. 11/13/2020 upon evaluation today patient appears to be doing well with regard to his leg ulcer. Fortunately he does not appear to be showing signs of infection anything like it was previous. This is great news. He was sent home with IV antibiotic therapy that seems to have done extremely well for him. No fevers, chills, nausea, vomiting, or diarrhea. 11/27/2020 upon evaluation today patient appears to be doing quite well in regard to his wounds currently. Fortunately there is no signs of active infection at this time which is great news and I am very pleased in this regard. He did have some concerns or his daughter did about an area on his gluteal region. With that being said I see nothing that is actually open at this point which is great news I do think continuing with a derma cloud is a good way to go. With regard to his leg he does have a new area on the right leg of open wound locations which is new compared to last visit we will need to address this as well. Otherwise he seems to be doing quite well in my opinion. 12/15/2020 upon evaluation today patient appears to be doing well with regard to his leg ulcer on the left. With that being said he did not have the Tubigrip on which I think is integral to getting this closed as he continues to weep. Subsequently I think the Tubigrip will help control some of the edema which  will help him in that regard. Fortunately there is no signs of active infection at this time. No fevers, chills, nausea, vomiting, or diarrhea. 01/01/2021 upon evaluation today patient appears to be doing well with regard to his wound. This is measuring better and actually is significantly smaller. Fortunately there is no signs of active infection at this time. No fevers, chills, nausea, vomiting, or diarrhea. 01/15/2021 on evaluation today patient appears to be doing well with regard to his left  leg ulcer though he still continues to weep quite a bit he did not have the Tubigrip on the day that he supposed to. Fortunately there is no signs of active infection at this time. No fevers, chills, nausea, vomiting, or diarrhea. 01/29/2021 upon evaluation today patient appears to be doing well with regard to his right leg which is completely healed. His left leg though not healed is just a very small open area that is weeping. With regard to his gluteal region this is actually new and in fact he has a wound that is really in the sacral area. This is stated to be a stage II that may be the case there is really no slough buildup I Ernie Hew leave it as such for the time being. We will keep a close eye on things however and see how it proceeds. 02/12/2021 upon evaluation today patient appears to be doing excellent in regard to his leg ulcers. In fact everything appears to be closed as best I can tell at this point. I think he is ready to transition into his compression stocking. With that being said his gluteal region still is open although it appears to be fairly clean I think the collagen is still a good thing to do here. We will see how things continue to improve over the next week or so. 02/26/2021 on evaluation today patient appears to be doing well with regard to his wound. He is tolerating the dressing changes without complication the wound is smaller though I feel like he is at the point where we need to try to do something to dry this up a little bit more I think switching from silver collagen to a silver alginate would be beneficial. 03/16/2021 upon evaluation today patient appears to be doing excellent in regard to his wound. He has been tolerating the dressing changes without complication. Fortunately there is no signs of active infection at this time. No fevers, chills, nausea, vomiting, or diarrhea. 04/02/2021 upon evaluation today patient appears to be doing well with regard to his wound. He really  does not have anything terribly open but nonetheless he still has a lot of redness in the sacral area I think there is still a lot of pressure getting to this region. With that being said I think he really needs to have more appropriate offloading I think that may be the biggest issue he spends a lot of time sitting in his recliner from what I am hearing. 5/23; there has been some improvement in the sacral area not so much in the area on the left buttock. Both of these very tiny punched-out areas with surrounding erythema. They seem to be tender out of proportion to what you might expect looking at them. We have been using silver alginate on this area 04/30/2021 upon evaluation today patient appears to be doing a little bit more poorly in regard to the wounds in the gluteal region and sacral Caffee, Robben A. ([id number]) region. Fortunately there does not appear to be any signs  of active infection which is great news but I do feel like there is some additional pressure injury here. I did discuss with the patient again today trying to keep pressure off the area I think this is still of utmost importance. Fortunately there is no signs of active infection systemically which is great news. 05/14/2021 upon evaluation today patient appears to be doing about the same in regard to his ulcer in the sacral region. Fortunately there does not appear to be any signs of active infection which is great news and overall very pleased in that regard. With that being said he does have some bleeding when cleansed with saline and gauze over the central portion of the wound I think this is indicative of the better wound bed which is at least good news. Objective Constitutional Well-nourished and well-hydrated in no acute distress. Vitals Time Taken: 11:03 AM, Temperature: 97.6 F, Pulse: 60 bpm, Respiratory Rate: 18 breaths/min, Blood Pressure: 116/64 mmHg. Respiratory normal breathing without  difficulty. Psychiatric this patient is able to make decisions and demonstrates good insight into disease process. Alert and Oriented x 3. pleasant and cooperative. General Notes: Patient's wound bed actually does not appear to be significantly smaller but fortunately does not appear to show any signs of infection either and very pleased in that regard. Integumentary (Hair, Skin) Wound #12 status is Open. Original cause of wound was Pressure Injury. The date acquired was: 01/18/2021. The wound has been in treatment 15 weeks. The wound is located on the Sacrum. The wound measures 0.4cm length x 0.4cm width x 0.1cm depth; 0.126cm^2 area and 0.013cm^3 volume. There is Fat Layer (Subcutaneous Tissue) exposed. There is no tunneling or undermining noted. There is a small amount of serous drainage noted. The wound margin is flat and intact. There is medium (34-66%) red, pink granulation within the wound bed. There is a medium (34-66%) amount of necrotic tissue within the wound bed including Adherent Slough. Wound #13 status is Open. Original cause of wound was Shear/Friction. The date acquired was: 03/31/2021. The wound has been in treatment 6 weeks. The wound is located on the Left Gluteus. The wound measures 0.1cm length x 0.1cm width x 0.1cm depth; 0.008cm^2 area and 0.001cm^3 volume. There is Fat Layer (Subcutaneous Tissue) exposed. There is no tunneling or undermining noted. There is a small amount of serous drainage noted. The wound margin is flat and intact. There is large (67-100%) red, pink granulation within the wound bed. There is a small (1-33%) amount of necrotic tissue within the wound bed including Adherent Slough. Assessment Active Problems ICD-10 Pressure ulcer of sacral region, stage 2 Long term (current) use of anticoagulants Presence of cardiac pacemaker Essential (primary) hypertension Chronic combined systolic (congestive) and diastolic (congestive) heart failure Atherosclerotic  heart disease of native coronary artery without angina pectoris Plan Follow-up Appointments: Return Appointment in 2 weeks. Edema Control - Lymphedema / Segmental Compressive Device / Other: Patient to wear own compression stockings. Remove compression stockings every night before going to bed and put on every morning when getting up. - bilat lower leg Eggenberger, Brown A. ([id number]) Elevate legs to the level of the heart and pump ankles as often as possible Elevate leg(s) parallel to the floor when sitting. Off-Loading: Gel wheelchair cushion - keep gel cushion under him when out of bed at all times Low air-loss mattress (Group 2) - needs an air mattress to offload pressure off of wounds Turn and reposition every 2 hours - keep off of sacral wound area/REPOSITION  EVERY 2 HOURS FOR WOUND HEALIN Additional Orders / Instructions: Wound #12 Sacrum: Other: - KEEP DRESSINGS ON BOTH SACRAL WOUNDS/FOLLOW WOUND CARE ORDERS Wound #13 Left Gluteus: Other: - KEEP DRESSINGS ON BOTH SACRAL WOUNDS/FOLLOW WOUND CARE ORDERS WOUND #12: - Sacrum Wound Laterality: Cleanser: Normal Saline (Generic) 3 x Per Week/30 Days Discharge Instructions: Wash your hands with soap and water. Remove old dressing, discard into plastic bag and place into trash. Cleanse the wound with Normal Saline prior to applying a clean dressing using gauze sponges, not tissues or cotton balls. Do not scrub or use excessive force. Pat dry using gauze sponges, not tissue or cotton balls. Primary Dressing: Hydrofera Blue Ready Transfer Foam, 2.5x2.5 (in/in) 3 x Per Week/30 Days Discharge Instructions: Apply Hydrofera Blue Ready to wound bed as directed Secondary Dressing: Mepilex Border Flex, 4x4 (in/in) (Generic) 3 x Per Week/30 Days Discharge Instructions: Apply to wound as directed. Do not cut. WOUND #13: - Gluteus Wound Laterality: Left Cleanser: Normal Saline (Generic) 3 x Per Week/30 Days Discharge Instructions: Wash your hands  with soap and water. Remove old dressing, discard into plastic bag and place into trash. Cleanse the wound with Normal Saline prior to applying a clean dressing using gauze sponges, not tissues or cotton balls. Do not scrub or use excessive force. Pat dry using gauze sponges, not tissue or cotton balls. Primary Dressing: Hydrofera Blue Ready Transfer Foam, 2.5x2.5 (in/in) 3 x Per Week/30 Days Discharge Instructions: Apply Hydrofera Blue Ready to wound bed as directed Secondary Dressing: Mepilex Border Flex, 4x4 (in/in) (Generic) 3 x Per Week/30 Days Discharge Instructions: Apply to wound as directed. Do not cut. 1. Would recommend currently that we actually going continue with wound care measures as before and the patient is in agreement with plan. This includes the use of Hydrofera Blue which I think still doing a good job followed by border foam dressing. 2. I am also can recommend appropriate offloading he likes the air mattress which is good he also likes to the Roho cushion which is also good. Hopefully will start to see things really improve here. We will see patient back for reevaluation in 1 week here in the clinic. If anything worsens or changes patient will contact our office for additional recommendations. Electronic Signature(s) Signed: 05/14/2021 6:08:55 PM By: Lenda Kelp PA-C Entered By: Lenda Kelp on 05/14/2021 18:08:55 [name] Hilts A. ([id number]) -------------------------------------------------------------------------------- SuperBill Details Patient Name: [name] Hilts A. Date of Service: 05/14/2021 Medical Record Number: [id number] Patient Account Number: [id number] Date of Birth/Sex: 02/28/1931 (86 y.o. M) Treating RN: Hansel Feinstein Primary Care Provider: [name] Other Clinician: Referring Provider: [name] Treating Provider/Extender: Rowan Blase in Treatment: 29 Diagnosis Coding ICD-10 Codes Code Description [phone] Pressure ulcer of  sacral region, stage 2 Z79.01 Long term (current) use of anticoagulants Z95.0 Presence of cardiac pacemaker I10 Essential (primary) hypertension I50.42 Chronic combined systolic (congestive) and diastolic (congestive) heart failure I25.10 Atherosclerotic heart disease of native coronary artery without angina pectoris Facility Procedures CPT4 Code: [id number] Description: [phone] - WOUND CARE VISIT-LEV 2 EST PT Modifier: Quantity: 1 Physician Procedures CPT4 Code: [id number] Description: 99213 - WC PHYS LEVEL 3 - EST PT Modifier: Quantity: 1 CPT4 Code: Description: ICD-10 Diagnosis Description L89.152 Pressure ulcer of sacral region, stage 2 Z79.01 Long term (current) use of anticoagulants Z95.0 Presence of cardiac pacemaker I10 Essential (primary) hypertension Modifier: Quantity: Electronic Signature(s) Signed: 05/14/2021 6:09:15 PM By: Lenda Kelp PA-C Previous Signature: 05/14/2021 3:06:19 PM Version By: Hansel Feinstein Entered  By: Lenda Kelp on 05/14/2021 18:09:15

## 2021-05-15 NOTE — Progress Notes (Signed)
ARGELIO, GRANIER ([id number]) Visit Report for 05/14/2021 Arrival Information Details Patient Name: [name], [name] Date of Service: 05/14/2021 10:45 AM Medical Record Number: [id number] Patient Account Number: [id number] Date of Birth/Sex: 1931/06/13 (87 y.o. M) Treating RN: [name] Primary Care [name]: [name] Other Clinician: Referring [name]: [name] Treating [name]: [name] in Treatment: 29 Visit Information History Since Last Visit Added or deleted any medications: No Patient Arrived: Wheel Chair Had a fall or experienced change in No Arrival Time: 11:00 activities of daily living that may affect Accompanied By: daughter risk of falls: Transfer Assistance: EasyP[employer] Patient Hospitalized since last visit: No Lift Pain Present Now: Yes Patient Identification Verified: Yes Secondary Verification Process Completed: Yes Patient Requires Transmission-Based No Precautions: Patient Has Alerts: No Electronic Signature(s) Signed: 05/15/2021 4:23:36 PM By: [name] Entered By: [name] on 05/14/2021 11:14:56 Haberle, [name] ([id number]) -------------------------------------------------------------------------------- Clinic Level of Care Assessment Details Patient Name: [name] A. Date of Service: 05/14/2021 10:45 AM Medical Record Number: [id number] Patient Account Number: [id number] Date of Birth/Sex: 1930-12-03 (87 y.o. M) Treating RN: [name] Primary Care [name]: [name] Other Clinician: Referring [name]: [name] Treating [name]/Extender: [name] in Treatment: 29 Clinic Level of Care Assessment Items TOOL 4 Quantity Score []  - Use when only an EandM is performed on FOLLOW-UP visit 0 ASSESSMENTS - Nursing Assessment / Reassessment []  - Reassessment of Co-morbidities (includes updates in patient status) 0 []  - 0 Reassessment of Adherence to Treatment Plan ASSESSMENTS - Wound  and Skin Assessment / Reassessment X - Simple Wound Assessment / Reassessment - one wound 1 5 []  - 0 Complex Wound Assessment / Reassessment - multiple wounds []  - 0 Dermatologic / Skin Assessment (not related to wound area) ASSESSMENTS - Focused Assessment []  - Circumferential Edema Measurements - multi extremities 0 []  - 0 Nutritional Assessment / Counseling / Intervention []  - 0 Lower Extremity Assessment (monofilament, tuning fork, pulses) []  - 0 Peripheral Arterial Disease Assessment (using hand held doppler) ASSESSMENTS - Ostomy and/or Continence Assessment and Care []  - Incontinence Assessment and Management 0 []  - 0 Ostomy Care Assessment and Management (repouching, etc.) PROCESS - Coordination of Care X - Simple Patient / Family Education for ongoing care 1 15 []  - 0 Complex (extensive) Patient / Family Education for ongoing care []  - 0 Staff obtains , Records, Test Results / Process Orders X- 1 10 Staff telephones HHA, Nursing Homes / Clarify orders / etc []  - 0 Routine Transfer to another Facility (non-emergent condition) []  - 0 Routine Hospital Admission (non-emergent condition) []  - 0 New Admissions / / Ordering NPWT, Apligraf, etc. []  - 0 Emergency Hospital Admission (emergent condition) X- 1 10 Simple Discharge Coordination []  - 0 Complex (extensive) Discharge Coordination PROCESS - Special Needs []  - Pediatric / Minor Patient Management 0 []  - 0 Isolation Patient Management []  - 0 Hearing / Language / Visual special needs []  - 0 Assessment of Community assistance (transportation, D/C planning, etc.) []  - 0 Additional assistance / Altered mentation []  - 0 Support Surface(s) Assessment (bed, cushion, seat, etc.) INTERVENTIONS - Wound Cleansing / Measurement [name], [name] ( ) []  - 0 Simple Wound Cleansing - one wound []  - 0 Complex Wound Cleansing - multiple wounds []  - 0 Wound Imaging (photographs -  any number of wounds) []  - 0 Wound Tracing (instead of photographs) []  - 0 Simple Wound Measurement - one wound []  - 0 Complex Wound Measurement - multiple wounds INTERVENTIONS -  Wound Dressings X - Small Wound Dressing one or multiple wounds 1 10 []  - 0 Medium Wound Dressing one or multiple wounds []  - 0 Large Wound Dressing one or multiple wounds []  - 0 Application of Medications - topical []  - 0 Application of Medications - injection INTERVENTIONS - Miscellaneous []  - External ear exam 0 []  - 0 Specimen Collection (cultures, biopsies, blood, body fluids, etc.) []  - 0 Specimen(s) / Culture(s) sent or taken to Lab for analysis []  - 0 Patient Transfer (multiple staff / Michiel SitesHoyer Lift / Similar devices) []  - 0 Simple Staple / Suture removal (25 or less) []  - 0 Complex Staple / Suture removal (26 or more) []  - 0 Hypo / Hyperglycemic Management (close monitor of Blood Glucose) []  - 0 Ankle / Brachial Index (ABI) - do not check if billed separately X- 1 5 Vital Signs Has the patient been seen at the hospital within the last three years: Yes Total Score: 55 Level Of Care: New/Established - Level 2 Electronic Signature(s) Signed: 05/14/2021 3:06:19 PM By: [name] FeinsteinBishop, [name] Entered By: [name] FeinsteinBishop, [name] on 05/14/2021 11:53:16 [name], [name] ([id number]) -------------------------------------------------------------------------------- Encounter Discharge Information Details Patient Name: [name] HiltsSCHENK, [name] Date of Service: 05/14/2021 10:45 AM Medical Record Number: [id number] Patient Account Number: [id number] Date of Birth/Sex: [birth year] (87 y.o. M) Treating RN: [name] BlockerSanchez, [name] Primary Care Kaitlyne Friedhoff: [name] DusterJohnston, [name] Other Clinician: Referring Breslyn Abdo: [name] DusterJohnston, [name] Treating Laurance Heide/Extender: [name] BlaseStone, [name] in Treatment: 29 Encounter Discharge Information Items Discharge Condition: Stable Ambulatory Status: Wheelchair Discharge Destination: Skilled Nursing Facility Orders Sent:  Yes Transportation: Private Auto Accompanied By: daughter Schedule Follow-up Appointment: Yes Clinical Summary of Care: Electronic Signature(s) Signed: 05/14/2021 4:29:35 PM By: Phillis HaggisSanchez Pereyda, Dondra PraderKenia RN Entered By: Phillis HaggisSanchez Pereyda, Dondra PraderKenia on 05/14/2021 12:01:08 [name] HiltsSCHENK, Kaelob A. ([id number]) -------------------------------------------------------------------------------- Lower Extremity Assessment Details Patient Name: [name] HiltsSCHENK, Chais A. Date of Service: 05/14/2021 10:45 AM Medical Record Number: [id number] Patient Account Number: [id number] Date of Birth/Sex: [birth year] (87 y.o. M) Treating RN: [name] FeinsteinBishop, [name] Primary Care Keasia Dubose: [name] DusterJohnston, [name] Other Clinician: Referring Makynzee Tigges: [name] DusterJohnston, [name] Treating Shaune Westfall/Extender: Allen DerryStone, [name] in Treatment: 29 Electronic Signature(s) Signed: 05/14/2021 3:06:19 PM By: [name] FeinsteinBishop, [name] Signed: 05/15/2021 4:23:36 PM By: [name] CramBurnette, Kyara Entered By: [name] CramBurnette, Kyara on 05/14/2021 11:23:55 [name], [name] ([id number]) -------------------------------------------------------------------------------- Multi Wound Chart Details Patient Name: [name] HiltsSCHENK, Kingdom A. Date of Service: 05/14/2021 10:45 AM Medical Record Number: [id number] Patient Account Number: [id number] Date of Birth/Sex: [birth year] (87 y.o. M) Treating RN: [name] FeinsteinBishop, [name] Primary Care Yosselyn Tax: [name] DusterJohnston, [name] Other Clinician: Referring Chandra Feger: [name] DusterJohnston, [name] Treating Cross Jorge/Extender: [name] BlaseStone, [name] in Treatment: 29 Vital Signs Height(in): Pulse(bpm): 60 Weight(lbs): Blood Pressure(mmHg): 116/64 Body Mass Index(BMI): Temperature(F): 97.6 Respiratory Rate(breaths/min): 18 Photos: [N/A:N/A] Wound Location: Sacrum Left Gluteus N/A Wounding Event: Pressure Injury Shear/Friction N/A Primary Etiology: Pressure Ulcer Pressure Ulcer N/A Comorbid History: Cataracts, Sleep Apnea, Congestive Cataracts, Sleep Apnea, Congestive N/A Heart Failure, Coronary Artery Heart Failure, Coronary  Artery Disease, Hypertension, Disease, Hypertension, Osteoarthritis Osteoarthritis Date Acquired: 01/18/2021 03/31/2021 N/A [name] of Treatment: 15 6 N/A Wound Status: Open Open N/A Measurements L x W x D (cm) 0.4x0.4x0.1 0.1x0.1x0.1 N/A Area (cm) : 0.126 0.008 N/A Volume (cm) : 0.013 0.001 N/A % Reduction in Area: 61.80% 88.70% N/A % Reduction in Volume: 86.90% 85.70% N/A Classification: Category/Stage II Category/Stage II N/A Exudate Amount: Small Small N/A Exudate Type: Serous Serous N/A Exudate Color: amber amber N/A Wound Margin: Flat and Intact Flat and Intact N/A Granulation Amount: Medium (34-66%) Large (67-100%) N/A Granulation Quality: Red, Pink Red, Pink N/A Necrotic Amount:  Medium (34-66%) Small (1-33%) N/A Exposed Structures: Fat Layer (Subcutaneous Tissue): Fat Layer (Subcutaneous Tissue): N/A Yes Yes Fascia: No Fascia: No Tendon: No Tendon: No Muscle: No Muscle: No Joint: No Joint: No Bone: No Bone: No Epithelialization: None None N/A Treatment Notes Electronic Signature(s) Signed: 05/14/2021 3:06:19 PM By: [name] Entered By: [name] on 05/14/2021 11:52:13 [name] ([id number]) -------------------------------------------------------------------------------- Multi-Disciplinary Care Plan Details Patient Name: [name] A. Date of Service: 05/14/2021 10:45 AM Medical Record Number: [id number] Patient Account Number: [id number] Date of Birth/Sex: 05/18/1931 (87 y.o. M) Treating RN: [name] Primary Care Melinna Linarez: [name] Other Clinician: Referring Sumedha Munnerlyn: [name] Treating Seleste Tallman/Extender: Allen Derry [name] in Treatment: 29 Active Inactive Electronic Signature(s) Signed: 05/14/2021 3:06:19 PM By: [name] Entered By: [name] on 05/14/2021 11:52:04 [name] A. ([id number]) -------------------------------------------------------------------------------- Pain Assessment Details Patient Name: [name] A. Date of Service: 05/14/2021 10:45 AM Medical Record Number: [id number] Patient Account Number: [id number] Date of Birth/Sex: August 11, 1931 (87 y.o. M) Treating RN: [name] Primary Care Deland Slocumb: [name] Other Clinician: Referring Zenola Dezarn: [name] Treating Lindley Hiney/Extender: [name] in Treatment: 29 Active Problems Location of Pain Severity and Description of Pain Patient Has Paino Yes Site Locations Rate the pain. Current Pain Level: 7 Pain Management and Medication Current Pain Management: Electronic Signature(s) Signed: 05/14/2021 3:06:19 PM By: [name] Signed: 05/15/2021 4:23:36 PM By: [name] Entered By: [name] on 05/14/2021 11:15:30 [name] A. ([id number]) -------------------------------------------------------------------------------- Patient/Caregiver Education Details Patient Name: [name] A. Date of Service: 05/14/2021 10:45 AM Medical Record Number: [id number] Patient Account Number: [id number] Date of Birth/Gender: Sep 10, 1931 (87 y.o. M) Treating RN: [name] Primary Care Physician: [name] Other Clinician: Referring Physician: Marcelino [name] Treating Physician/Extender: [name] in Treatment: 29 Education Assessment Education Provided To: Patient and Caregiver daughter/SNF Education Topics Provided Basic Hygiene: Methods: Explain/Verbal Responses: State content correctly Offloading: Methods: Explain/Verbal Responses: State content correctly Wound/Skin Impairment: Methods: Explain/Verbal Responses: State content correctly Electronic Signature(s) Signed: 05/14/2021 3:06:19 PM By: [name] Entered By: [name] on 05/14/2021 11:53:49 [name], [name] ([id number]) -------------------------------------------------------------------------------- Wound Assessment Details Patient Name: [name] A. Date of Service: 05/14/2021 10:45 AM Medical Record Number:  [id number] Patient Account Number: [id number] Date of Birth/Sex: [birth year] (87 y.o. M) Treating RN: [name] Primary Care Cathrine Krizan: [name] Other Clinician: Referring Ellawyn Wogan: [name] Treating Bharat Antillon/Extender: Allen Derry [name] in Treatment: 29 Wound Status Wound Number: 12 Primary Pressure Ulcer Etiology: Wound Location: Sacrum Wound Open Wounding Event: Pressure Injury Status: Date Acquired: 01/18/2021 Comorbid Cataracts, Sleep Apnea, Congestive Heart Failure, [name] Of Treatment: 15 History: Coronary Artery Disease, Hypertension, Osteoarthritis Clustered Wound: No Photos Wound Measurements Length: (cm) 0.4 Width: (cm) 0.4 Depth: (cm) 0.1 Area: (cm) 0.126 Volume: (cm) 0.013 % Reduction in Area: 61.8% % Reduction in Volume: 86.9% Epithelialization: None Tunneling: No Undermining: No Wound Description Classification: Category/Stage II Wound Margin: Flat and Intact Exudate Amount: Small Exudate Type: Serous Exudate Color: amber Foul Odor After Cleansing: No Slough/Fibrino Yes Wound Bed Granulation Amount: Medium (34-66%) Exposed Structure Granulation Quality: Red, Pink Fascia Exposed: No Necrotic Amount: Medium (34-66%) Fat Layer (Subcutaneous Tissue) Exposed: Yes Necrotic Quality: Adherent Slough Tendon Exposed: No Muscle Exposed: No Joint Exposed: No Bone Exposed: No Treatment Notes Wound #12 (Sacrum) Cleanser Normal Saline Discharge Instruction: Wash your hands with soap and water. Remove old dressing, discard into plastic bag and place into trash. Cleanse the wound with Normal Saline prior to applying a clean dressing using gauze sponges, not  tissues or cotton balls. Do not scrub or use excessive force. Pat dry using gauze sponges, not tissue or cotton balls. [name], [name] ([id number]) Peri-Wound Care Topical Primary Dressing Hydrofera Blue Ready Transfer Foam, 2.5x2.5 (in/in) Discharge Instruction: Apply Hydrofera Blue Ready to  wound bed as directed Secondary Dressing Mepilex Border Flex, 4x4 (in/in) Discharge Instruction: Apply to wound as directed. Do not cut. Secured With Compression Wrap Compression Stockings Add-Ons Electronic Signature(s) Signed: 05/14/2021 3:06:19 PM By: [name] Signed: 05/15/2021 4:23:36 PM By: [name] Entered By: [name] on 05/14/2021 11:22:59 [name] A. ([id number]) -------------------------------------------------------------------------------- Wound Assessment Details Patient Name: [name] A. Date of Service: 05/14/2021 10:45 AM Medical Record Number: [id number] Patient Account Number: [id number] Date of Birth/Sex: 1930/12/24 (87 y.o. M) Treating RN: [name] Primary Care Clinton Dragone: [name] Other Clinician: Referring Alynna Hargrove: [name] Treating Kamden Reber/Extender: Allen Derry [name] in Treatment: 29 Wound Status Wound Number: 13 Primary Pressure Ulcer Etiology: Wound Location: Left Gluteus Wound Open Wounding Event: Shear/Friction Status: Date Acquired: 03/31/2021 Comorbid Cataracts, Sleep Apnea, Congestive Heart Failure, [name] Of Treatment: 6 History: Coronary Artery Disease, Hypertension, Osteoarthritis Clustered Wound: No Photos Wound Measurements Length: (cm) 0.1 Width: (cm) 0.1 Depth: (cm) 0.1 Area: (cm) 0.008 Volume: (cm) 0.001 % Reduction in Area: 88.7% % Reduction in Volume: 85.7% Epithelialization: None Tunneling: No Undermining: No Wound Description Classification: Category/Stage II Wound Margin: Flat and Intact Exudate Amount: Small Exudate Type: Serous Exudate Color: amber Foul Odor After Cleansing: No Slough/Fibrino Yes Wound Bed Granulation Amount: Large (67-100%) Exposed Structure Granulation Quality: Red, Pink Fascia Exposed: No Necrotic Amount: Small (1-33%) Fat Layer (Subcutaneous Tissue) Exposed: Yes Necrotic Quality: Adherent Slough Tendon Exposed: No Muscle Exposed: No Joint Exposed:  No Bone Exposed: No Treatment Notes Wound #13 (Gluteus) Wound Laterality: Left Cleanser Normal Saline Discharge Instruction: Wash your hands with soap and water. Remove old dressing, discard into plastic bag and place into trash. Cleanse the wound with Normal Saline prior to applying a clean dressing using gauze sponges, not tissues or cotton balls. Do not scrub or use excessive force. Pat dry using gauze sponges, not tissue or cotton balls. [name], [name] ([id number]) Peri-Wound Care Topical Primary Dressing Hydrofera Blue Ready Transfer Foam, 2.5x2.5 (in/in) Discharge Instruction: Apply Hydrofera Blue Ready to wound bed as directed Secondary Dressing Mepilex Border Flex, 4x4 (in/in) Discharge Instruction: Apply to wound as directed. Do not cut. Secured With Compression Wrap Compression Stockings Add-Ons Electronic Signature(s) Signed: 05/14/2021 3:06:19 PM By: [name] Signed: 05/15/2021 4:23:36 PM By: [name] Entered By: [name] on 05/14/2021 11:23:43 [name] A. ([id number]) -------------------------------------------------------------------------------- Vitals Details Patient Name: [name] A. Date of Service: 05/14/2021 10:45 AM Medical Record Number: [id number] Patient Account Number: [id number] Date of Birth/Sex: [birth year] (87 y.o. M) Treating RN: [name] Primary Care David Rodriquez: [name] Other Clinician: Referring Cameka Rae: [name] Treating Itzayana Pardy/Extender: Allen Derry [name] in Treatment: 29 Vital Signs Time Taken: 11:03 Temperature (F): 97.6 Pulse (bpm): 60 Respiratory Rate (breaths/min): 18 Blood Pressure (mmHg): 116/64 Reference Range: 80 - 120 mg / dl Electronic Signature(s) Signed: 05/15/2021 4:23:36 PM By: [name] Entered By: [name] on 05/14/2021 11:15:19

## 2021-05-16 ENCOUNTER — Non-Acute Institutional Stay: Payer: Medicare Other | Admitting: Adult Health Nurse Practitioner

## 2021-05-16 ENCOUNTER — Other Ambulatory Visit: Payer: Self-pay

## 2021-05-16 ENCOUNTER — Encounter: Payer: Self-pay | Admitting: Adult Health Nurse Practitioner

## 2021-05-16 VITALS — HR 93 | Wt 169.3 lb

## 2021-05-16 DIAGNOSIS — I5032 Chronic diastolic (congestive) heart failure: Secondary | ICD-10-CM

## 2021-05-16 DIAGNOSIS — Z515 Encounter for palliative care: Secondary | ICD-10-CM

## 2021-05-16 DIAGNOSIS — E46 Unspecified protein-calorie malnutrition: Secondary | ICD-10-CM

## 2021-05-16 NOTE — Progress Notes (Signed)
[facility] Pall[facility] Consult Note Telephone: [phone]  Fax: [phone]    Date of encounter: 05/16/21 PATIENT NAME: [name] [address] [place] [address]   [phone] (home)  DOB: 31-Oct-1931 MRN: [id number] PRIMARY CARE PROVIDER:    Dr. Frazier Richards  REFERRING PROVIDER:   Dr. Frazier Richards  RESPONSIBLE PARTY:    Contact Information     Name Relation Home Work Mobile   Kerrie [place] Daughter [phone]     Troxler,Suzanne Daughter [phone]  [phone]        I met face to face with patient in facility. Palliative Care was asked to follow this patient by consultation request of  Dr. Frazier Richards to address advance care planning and complex medical decision making. This is a follow up visit.   Called daughter, [name], to update on today's visit                                    ASSESSMENT AND PLAN / RECOMMENDATIONS:    Advance Care Planning/Goals of Care: Goals include to maximize quality of life and symptom management. CODE STATUS: DNR  Symptom Management/Plan:  CHF: This seems to be stable with no increase in edema or shortness of breath.  Continue supportive care at facility  [facility]: Patient's appetite is improving and is gaining weight.  Continue Remeron as ordered and supportive care at facility. Did talk with daughter via phone today to update on today's visit and she feels as if her father has made good improvement since starting the Remeron.  His appetite is picking up and starting to regain some of his strength.  Palliative will continue to monitor for symptom management/decline and make recommendations as needed.   Follow up Palliative Care Visit: Palliative care will continue to follow for complex medical decision making, advance care planning, and clarification of goals. Return 6-8 weeks or prn.  I spent 40 minutes providing this consultation. More than 50% of the  time in this consultation was spent in counseling and care coordination.  PPS: 40%  HOSPICE ELIGIBILITY/DIAGNOSIS: TBD  Chief Complaint: follow up palliative visit  HISTORY OF PRESENT ILLNESS:  [name] is a 87 y.o. year old male  with aortic stenosis w/valve replacement (on coumadin), CHF, HTN, CAD, venous stasis.  Patient states feeling better today than he has.  States having some arthritis pain that comes and goes.  Patient states that his appetite is picking up.  Staff does report that he has had a weight gain from 162.3 pounds on 05/04/2021 169.3 pounds today.  Staff does report that they have not had to use the left for transfers in over a week.  Patient continues to work with physical therapy and he feels like he is starting to get stronger.  He takes it slowly but can get up with assistance and walk with walker short distances.  Does continue to use wheelchair for longer distances.  Denies falls, increased shortness of breath or cough, chest pain, headaches, N/V/D, constipation, dysuria, hematuria.  History obtained from review of EMR and interview with family, facility staffand Mr. Gibbs.   Physical Exam:   Constitutional: NAD General: frail appearing, WNWD EYES: anicteric sclera, lids intact, no discharge ENMT: intact hearing, oral mucous membranes moist CV: S1S2, RRR, no LE edema Pulmonary: LCTA, no increased work of breathing, no cough Abdomen:  normo-active BS + 4 quadrants, soft and  non tender MSK: Patient is slow but does ambulate short distances with walker.  Does use wheelchair for longer distances Skin: warm and dry, no rashes on visible skin Neuro:  A and O x 3 Hem/lymph/immuno: Does have widespread bruising related to being on Coumadin     Thank you for the opportunity to participate in the care of [name].  The palliative care team will continue to follow. Please call our office at [phone] if we can be of additional assistance.   Ruston Fedora Jenetta Downer,  NP , DNP  This chart was dictated using voice recognition software.  Despite best efforts to proofread,  errors can occur which can change the documentation meaning.   COVID-19 PATIENT SCREENING TOOL Asked and negative response unless otherwise noted:   Have you had symptoms of covid, tested positive or been in contact with someone with symptoms/positive test in the past 5-10 days?

## 2021-05-18 ENCOUNTER — Other Ambulatory Visit
Admission: RE | Admit: 2021-05-18 | Discharge: 2021-05-18 | Disposition: A | Discharge status: Home / Self Care | Payer: Medicare Other | Source: Skilled Nursing Facility | Attending: Internal Medicine | Admitting: Internal Medicine

## 2021-05-18 DIAGNOSIS — I4891 Unspecified atrial fibrillation: Secondary | ICD-10-CM | POA: Diagnosis present

## 2021-05-18 LAB — PROTIME-INR
INR: 1.6 — ABNORMAL HIGH (ref 0.8–1.2)
Prothrombin Time: 19.4 seconds — ABNORMAL HIGH (ref 11.4–15.2)

## 2021-06-01 ENCOUNTER — Other Ambulatory Visit: Payer: Self-pay

## 2021-06-01 ENCOUNTER — Encounter: Payer: Medicare Other | Attending: Physician Assistant | Admitting: Physician Assistant

## 2021-06-01 DIAGNOSIS — L97812 Non-pressure chronic ulcer of other part of right lower leg with fat layer exposed: Secondary | ICD-10-CM | POA: Insufficient documentation

## 2021-06-01 DIAGNOSIS — Z7901 Long term (current) use of anticoagulants: Secondary | ICD-10-CM | POA: Insufficient documentation

## 2021-06-01 DIAGNOSIS — L89152 Pressure ulcer of sacral region, stage 2: Secondary | ICD-10-CM | POA: Insufficient documentation

## 2021-06-01 DIAGNOSIS — N1832 Chronic kidney disease, stage 3b: Secondary | ICD-10-CM | POA: Insufficient documentation

## 2021-06-01 DIAGNOSIS — Z950 Presence of cardiac pacemaker: Secondary | ICD-10-CM | POA: Insufficient documentation

## 2021-06-01 DIAGNOSIS — I872 Venous insufficiency (chronic) (peripheral): Secondary | ICD-10-CM | POA: Diagnosis not present

## 2021-06-01 DIAGNOSIS — I129 Hypertensive chronic kidney disease with stage 1 through stage 4 chronic kidney disease, or unspecified chronic kidney disease: Secondary | ICD-10-CM | POA: Diagnosis not present

## 2021-06-01 DIAGNOSIS — Z952 Presence of prosthetic heart valve: Secondary | ICD-10-CM | POA: Insufficient documentation

## 2021-06-01 DIAGNOSIS — I890 Lymphedema, not elsewhere classified: Secondary | ICD-10-CM | POA: Diagnosis not present

## 2021-06-01 DIAGNOSIS — L97822 Non-pressure chronic ulcer of other part of left lower leg with fat layer exposed: Secondary | ICD-10-CM | POA: Insufficient documentation

## 2021-06-01 NOTE — Progress Notes (Addendum)
[name], [name] ([id number]) Visit Report for 06/01/2021 Chief Complaint Document Details Patient Name: [name], [name] Date of Service: 06/01/2021 10:00 AM Medical Record Number: [id number] Patient Account Number: [id number] Date of Birth/Sex: 26-Jun-1931 (87 y.o. M) Treating RN: Yevonne Pax Primary Care Provider: [name] Other Clinician: Referring Provider: [name] Treating Provider/Extender: Rowan Blase in Treatment: 32 Information Obtained from: Patient Chief Complaint Sacral and Left Gluteal Ulcers Electronic Signature(s) Signed: 06/01/2021 10:15:30 AM By: Lenda Kelp PA-C Entered By: Lenda Kelp on 06/01/2021 10:15:30 Joellyn Quails ([id number]) -------------------------------------------------------------------------------- HPI Details Patient Name: [name] Hilts A. Date of Service: 06/01/2021 10:00 AM Medical Record Number: [id number] Patient Account Number: [id number] Date of Birth/Sex: 10-06-1931 (87 y.o. M) Treating RN: Yevonne Pax Primary Care Provider: [name] Other Clinician: Referring Provider: [name] Treating Provider/Extender: Rowan Blase in Treatment: 32 History of Present Illness HPI Description: 04/21/2019 ADMISSION This is an independent 87 year old man who lives in the independent part of [address] of [place]. He has a history of chronic lower extremity edema and wears compression stockings. He states about a month ago he was taking off the one on the right and pulled some skin off accidentally. He has had 2 small wounds on the right anterior and right medial lower extremity. I note looking through [facility] health link that he had multiple venous ultrasounds in 2015 and 16. These were DVT rule outs. He did have a Baker's cyst on the left. He has not not had a previous wound history although he did have a history of cellulitis in his legs. Past medical history; includes aortic stenosis status post mechanical AVR in  2007 on chronic Coumadin, lower extremity edema with a history of cellulitis, history of MRSA. ABIs in our clinic were 1.1 on the right 6/3; patient with predominantly venous insufficiency ulcers in the right lower leg probably some degree of lymphedema. He had to wounds last week. We put him in 3 layer compression. The nurses at [facility] are changing the dressing. The area laterally is healed but he still has a small very painful area on the anterior tibial area. He is on Coumadin because of mechanical aortic valve. 6/10; venous insufficiency ulcers on the right lower leg. He has some degree of lymphedema. We have been using 3 layer compression with silver collagen small wound. Dimensions are improved. I gave him doxycycline last week which he tolerated because of surrounding erythema. This is improved also 6/17; arrives in clinic today with portable silver collagen dressing tightly adherent to the wound. Also felt that the wrap was too tight 3 layer being changed at the [facility] 6/24; patient's wound is small still adherent debris over the surface however even under illumination it was hard to see what was still open here. We have been using silver collagen 7/1; the patient arrives in the clinic today and the area on the right lower leg is healed. He has severe chronic venous insufficiency. He has 20/30 compression stockings. Readmission: 06/22/2020 on evaluation today patient presents for reevaluation here in our clinic although it has been a little over a year since we last saw him. He does have a history of lymphedema, venous insufficiency, anticoagulant therapy, he is on a pacemaker which is the reason for the anticoagulants, hypertension, and congestive heart failure. With that being said unfortunately he has been dealing with a wound of the left and right legs which has been giving him some trouble since arrival first 2021. That is  in regard to the left leg ulcer. In  regard to the right leg ulcer this is just a very small area that really I think will seal up quite nicely is more of the lymphedema opening than anything else. With that being said unfortunately the left leg is quite significant. We actually have noted that the patient's been dealing with this for quite some time and I think that there is a chance we may want to consider biopsy. With that being said he is on Coumadin therefore we were not able to perform a biopsy at this point. I think that something however in the future that may be warranted we may just have to take him off of the current Coumadin regimen in order to do this. We discussed doing it today but I am more concerned about the fact that he could have issues with uncontrolled bleeding and in that case we may have to send him to the ER. His daughter was not wanting to take that chance which I can completely understand. Nonetheless so far they have been using antibiotic ointment over the area I am just putting a protective dressing I do believe the patient is that he needs some type of compression. 07/07/2020 on evaluation today patient appears to be doing poorly in regard to his bilateral lower extremities today. He actually went to urgent care last night due to having pain in his legs due to the wraps. It appears they got somewhat tight on him because of the fact that he is having such significant swelling. He appears to be fluid overloaded I do not see any signs of infection actively at this point which is good news but unfortunately I think that this is something that may need to be addressed even by cardiology more so than just with a different or alternate type of wrap. I will contact his cardiologist, Dr. Mariah Milling, as well. 07/10/2020 upon evaluation today patient unfortunately appears to be doing worse even than when I saw him on Friday from the standpoint of where his legs stand. The main issue is that he has increased erythema although  there is still not warm to touch I am concerned about infection and about this getting significantly worse. Again my initial concern was more fluid overload and I discussed this with his cardiology office on Friday. With that being said I feel like this has gotten worse his daughter is in agreement and I think that the best option would be to send him to the hospital at this point. READMISSION 09/06/2020 Is a patient who has had 2 stays in this clinic firstly in 2020 and more recently from 06/02/2020 through 07/10/2020. He was cared for by [facility]. When he was last here he had bilateral lower extremity ulcers in the setting of chronic venous insufficiency with lymphedema. When he we last saw him he had weeping edema fluido Cellulitis and he was sent to the hospital. He was admitted initially from 06/30/2020 through 07/14/2020 with cellulitis plus stasis dermatitis. Treated empirically with vancomycin and had increases in his torsemide. He was then sent to the skilled level of [facility] but readmitted to hospital from 07/29/2020 through 08/10/2020 with blood loss anemia hemoglobin of 4 and OB positive stools. During this hospitalization he was noted to have a sacral wound at stage II. He was sent to [facility] [place] again in the skilled level. He had compression on his legs with Coban. He is now back at his independent living setting.  He has not been wearing compression and his daughter feels there is already increasing swelling. To have stockings at home not exactly sure at what strength. [name] HiltsSCHENK, Ramadan A. ([id number]) Past medical history includes lymphedema, chronic venous insufficiency, pacemaker, congestive heart failure, hypertension, aortic valve stenosis status post mechanical valve replacement in 2002 on chronic Coumadin, chronic kidney disease stage IIIb, atrial fibrillation, peripheral neuropathy, large hiatal hernia. We did not test his ABIs in the clinic  today. Readmission: 10/17/2020 on evaluation today patient appears to be doing somewhat poorly in regard to his left leg and the fact that he does have an open wound at this point. He has not had the compression socks placed at the facility they have not been putting them on at all unfortunately. He does not have on the right leg currently and apparently there is significant put him on the left leg with the wound which I understand is that can be somewhat tight obviously. Nonetheless I do believe the right leg needs to have a compression sock and I do believe on the left side right now we can do something a little better in order to try to get the wound healed at this point. The patient is on torsemide and currently allopurinol for gout. He also has daily weights due to congestive heart failure I am assuming in order to monitor his fluid status overall. He is currently a DNR at the facility. 10/30/2020 upon evaluation today patient appears to be doing somewhat poorly at this time in regard to his bilateral lower extremities. His left leg which is the only place where he had a wound last week when I saw him is actually doing worse with new wound openings. The right leg also has a small wound as well. However the most concerning thing is that last week he did not have any signs of infection or erythema there is no warmth to touch over the leg. This week the entire leg is more sore he also has erythema extending from the ankle to just below the knee with the area being erythematous and warm to touch which I believe represents cellulitis at minimum. I am concerned about how this is spread so quickly and the fact that he is having increased pain and also not feeling as well. All in all I discussed with the patient that unfortunately I feel like he may be best served going to the ER for further evaluation of this issue currently to try to see about the potential for IV antibiotics and to have  appropriate lab work performed. His daughter is present during the visit today and she was included in the decision making at this point. 11/13/2020 upon evaluation today patient appears to be doing well with regard to his leg ulcer. Fortunately he does not appear to be showing signs of infection anything like it was previous. This is great news. He was sent home with IV antibiotic therapy that seems to have done extremely well for him. No fevers, chills, nausea, vomiting, or diarrhea. 11/27/2020 upon evaluation today patient appears to be doing quite well in regard to his wounds currently. Fortunately there is no signs of active infection at this time which is great news and I am very pleased in this regard. He did have some concerns or his daughter did about an area on his gluteal region. With that being said I see nothing that is actually open at this point which is great news I do think continuing with a derma  cloud is a good way to go. With regard to his leg he does have a new area on the right leg of open wound locations which is new compared to last visit we will need to address this as well. Otherwise he seems to be doing quite well in my opinion. 12/15/2020 upon evaluation today patient appears to be doing well with regard to his leg ulcer on the left. With that being said he did not have the Tubigrip on which I think is integral to getting this closed as he continues to weep. Subsequently I think the Tubigrip will help control some of the edema which will help him in that regard. Fortunately there is no signs of active infection at this time. No fevers, chills, nausea, vomiting, or diarrhea. 01/01/2021 upon evaluation today patient appears to be doing well with regard to his wound. This is measuring better and actually is significantly smaller. Fortunately there is no signs of active infection at this time. No fevers, chills, nausea, vomiting, or diarrhea. 01/15/2021 on evaluation today patient  appears to be doing well with regard to his left leg ulcer though he still continues to weep quite a bit he did not have the Tubigrip on the day that he supposed to. Fortunately there is no signs of active infection at this time. No fevers, chills, nausea, vomiting, or diarrhea. 01/29/2021 upon evaluation today patient appears to be doing well with regard to his right leg which is completely healed. His left leg though not healed is just a very small open area that is weeping. With regard to his gluteal region this is actually new and in fact he has a wound that is really in the sacral area. This is stated to be a stage II that may be the case there is really no slough buildup I Ernie Hew leave it as such for the time being. We will keep a close eye on things however and see how it proceeds. 02/12/2021 upon evaluation today patient appears to be doing excellent in regard to his leg ulcers. In fact everything appears to be closed as best I can tell at this point. I think he is ready to transition into his compression stocking. With that being said his gluteal region still is open although it appears to be fairly clean I think the collagen is still a good thing to do here. We will see how things continue to improve over the next week or so. 02/26/2021 on evaluation today patient appears to be doing well with regard to his wound. He is tolerating the dressing changes without complication the wound is smaller though I feel like he is at the point where we need to try to do something to dry this up a little bit more I think switching from silver collagen to a silver alginate would be beneficial. 03/16/2021 upon evaluation today patient appears to be doing excellent in regard to his wound. He has been tolerating the dressing changes without complication. Fortunately there is no signs of active infection at this time. No fevers, chills, nausea, vomiting, or diarrhea. 04/02/2021 upon evaluation today patient appears to  be doing well with regard to his wound. He really does not have anything terribly open but nonetheless he still has a lot of redness in the sacral area I think there is still a lot of pressure getting to this region. With that being said I think he really needs to have more appropriate offloading I think that may be the biggest issue  he spends a lot of time sitting in his recliner from what I am hearing. 5/23; there has been some improvement in the sacral area not so much in the area on the left buttock. Both of these very tiny punched-out areas with surrounding erythema. They seem to be tender out of proportion to what you might expect looking at them. We have been using silver alginate on this area 04/30/2021 upon evaluation today patient appears to be doing a little bit more poorly in regard to the wounds in the gluteal region and sacral region. Fortunately there does not appear to be any signs of active infection which is great news but I do feel like there is some additional pressure injury here. I did discuss with the patient again today trying to keep pressure off the area I think this is still of utmost importance. Fortunately there is no signs of active infection systemically which is great news. 05/14/2021 upon evaluation today patient appears to be doing about the same in regard to his ulcer in the sacral region. Fortunately there does Junkin, Tarrance A. ([id number]) not appear to be any signs of active infection which is great news and overall very pleased in that regard. With that being said he does have some bleeding when cleansed with saline and gauze over the central portion of the wound I think this is indicative of the better wound bed which is at least good news. 06/01/2021 upon evaluation today patient's wounds actually appear to be doing better one is healed the central is still open but seems to be doing excellent. There is minimal slough noted. Electronic Signature(s) Signed:  06/01/2021 10:25:52 AM By: Lenda Kelp PA-C Entered By: Lenda Kelp on 06/01/2021 10:25:52 Joellyn Quails ([id number]) -------------------------------------------------------------------------------- Physical Exam Details Patient Name: [name] Hilts A. Date of Service: 06/01/2021 10:00 AM Medical Record Number: [id number] Patient Account Number: [id number] Date of Birth/Sex: [birth year] (87 y.o. M) Treating RN: Yevonne Pax Primary Care Provider: [name] Other Clinician: Referring Provider: [name] Treating Provider/Extender: Rowan Blase in Treatment: 32 Constitutional Well-nourished and well-hydrated in no acute distress. Respiratory normal breathing without difficulty. Psychiatric this patient is able to make decisions and demonstrates good insight into disease process. Alert and Oriented x 3. pleasant and cooperative. Notes Upon inspection patient's wound bed actually showed signs of good granulation epithelization at this point. There does not appear to be any evidence of active infection. The good news is I feel like that the patient is showing signs of excellent improvement which is great news as well. Electronic Signature(s) Signed: 06/01/2021 10:26:09 AM By: Lenda Kelp PA-C Entered By: Lenda Kelp on 06/01/2021 10:26:08 Joellyn Quails ([id number]) -------------------------------------------------------------------------------- Physician Orders Details Patient Name: [name] Hilts A. Date of Service: 06/01/2021 10:00 AM Medical Record Number: [id number] Patient Account Number: [id number] Date of Birth/Sex: [birth year] (87 y.o. M) Treating RN: Yevonne Pax Primary Care Provider: [name] Other Clinician: Referring Provider: [name] Treating Provider/Extender: Rowan Blase in Treatment: 32 Verbal / Phone Orders: No Diagnosis Coding ICD-10 Coding Code Description L89.152 Pressure ulcer of sacral region, stage 2 Z79.01 Long  term (current) use of anticoagulants Z95.0 Presence of cardiac pacemaker I10 Essential (primary) hypertension I50.42 Chronic combined systolic (congestive) and diastolic (congestive) heart failure I25.10 Atherosclerotic heart disease of native coronary artery without angina pectoris Follow-up Appointments o Return Appointment in 2 weeks. Edema Control - Lymphedema / Segmental Compressive Device / Other Bilateral Lower Extremities o Patient to wear own compression stockings.  Remove compression stockings every night before going to bed and put on every morning when getting up. - bilat lower leg o Elevate legs to the level of the heart and pump ankles as often as possible o Elevate leg(s) parallel to the floor when sitting. Off-Loading o Gel wheelchair cushion - keep gel cushion under him when out of bed at all times o Low air-loss mattress (Group 2) - needs an air mattress to offload pressure off of wounds o Turn and reposition every 2 hours - keep off of sacral wound area/REPOSITION EVERY 2 HOURS FOR WOUND HEALIN Additional Orders / Instructions Wound #12 Sacrum o Other: - KEEP DRESSINGS ON BOTH SACRAL WOUNDS/FOLLOW WOUND CARE ORDERS Wound Treatment Wound #12 - Sacrum Cleanser: Normal Saline (Generic) 3 x Per Week/30 Days Discharge Instructions: Wash your hands with soap and water. Remove old dressing, discard into plastic bag and place into trash. Cleanse the wound with Normal Saline prior to applying a clean dressing using gauze sponges, not tissues or cotton balls. Do not scrub or use excessive force. Pat dry using gauze sponges, not tissue or cotton balls. Primary Dressing: Hydrofera Blue Ready Transfer Foam, 2.5x2.5 (in/in) 3 x Per Week/30 Days Discharge Instructions: Apply Hydrofera Blue Ready to wound bed as directed Secondary Dressing: Mepilex Border Flex, 4x4 (in/in) (Generic) 3 x Per Week/30 Days Discharge Instructions: Apply to wound as directed. Do not  cut. Electronic Signature(s) Signed: 06/01/2021 5:03:48 PM By: Lenda Kelp PA-C Signed: 06/04/2021 7:11:39 PM By: Yevonne Pax RN Entered By: Yevonne Pax on 06/01/2021 10:21:26 Joellyn Quails ([id number]) -------------------------------------------------------------------------------- Problem List Details Patient Name: [name] Hilts A. Date of Service: 06/01/2021 10:00 AM Medical Record Number: [id number] Patient Account Number: [id number] Date of Birth/Sex: 03-Feb-1931 (87 y.o. M) Treating RN: Yevonne Pax Primary Care Provider: [name] Other Clinician: Referring Provider: [name] Treating Provider/Extender: Rowan Blase in Treatment: 32 Active Problems ICD-10 Encounter Code Description Active Date MDM Diagnosis L89.152 Pressure ulcer of sacral region, stage 2 01/29/2021 No Yes Z79.01 Long term (current) use of anticoagulants 10/17/2020 No Yes Z95.0 Presence of cardiac pacemaker 10/17/2020 No Yes I10 Essential (primary) hypertension 10/17/2020 No Yes I50.42 Chronic combined systolic (congestive) and diastolic (congestive) heart 10/17/2020 No Yes failure I25.10 Atherosclerotic heart disease of native coronary artery without angina 10/17/2020 No Yes pectoris Inactive Problems ICD-10 Code Description Active Date Inactive Date I89.0 Lymphedema, not elsewhere classified 10/17/2020 10/17/2020 I87.2 Venous insufficiency (chronic) (peripheral) 10/17/2020 10/17/2020 L97.822 Non-pressure chronic ulcer of other part of left lower leg with fat layer exposed 10/17/2020 10/17/2020 L97.812 Non-pressure chronic ulcer of other part of right lower leg with fat layer 10/17/2020 10/17/2020 exposed Resolved Problems Electronic Signature(s) Signed: 06/01/2021 10:15:22 AM By: Geni Bers, Arlander A. ([id number]) Entered By: Lenda Kelp on 06/01/2021 10:15:22 [name] Hilts A.  ([id number]) -------------------------------------------------------------------------------- Progress Note Details Patient Name: [name] Hilts A. Date of Service: 06/01/2021 10:00 AM Medical Record Number: [id number] Patient Account Number: [id number] Date of Birth/Sex: 1931/11/01 (87 y.o. M) Treating RN: Yevonne Pax Primary Care Provider: [name] Other Clinician: Referring Provider: [name] Treating Provider/Extender: Rowan Blase in Treatment: 32 Subjective Chief Complaint Information obtained from Patient Sacral and Left Gluteal Ulcers History of Present Illness (HPI) 04/21/2019 ADMISSION This is an independent 87 year old man who lives in the independent part of [address] of [place]. He has a history of chronic lower extremity edema and wears compression stockings. He states about a month ago he was taking off the one on the right  and pulled some skin off accidentally. He has had 2 small wounds on the right anterior and right medial lower extremity. I note looking through [facility] health link that he had multiple venous ultrasounds in 2015 and 16. These were DVT rule outs. He did have a Baker's cyst on the left. He has not not had a previous wound history although he did have a history of cellulitis in his legs. Past medical history; includes aortic stenosis status post mechanical AVR in 2007 on chronic Coumadin, lower extremity edema with a history of cellulitis, history of MRSA. ABIs in our clinic were 1.1 on the right 6/3; patient with predominantly venous insufficiency ulcers in the right lower leg probably some degree of lymphedema. He had to wounds last week. We put him in 3 layer compression. The nurses at [facility] are changing the dressing. The area laterally is healed but he still has a small very painful area on the anterior tibial area. He is on Coumadin because of mechanical aortic valve. 6/10; venous insufficiency ulcers on the right lower leg.  He has some degree of lymphedema. We have been using 3 layer compression with silver collagen small wound. Dimensions are improved. I gave him doxycycline last week which he tolerated because of surrounding erythema. This is improved also 6/17; arrives in clinic today with portable silver collagen dressing tightly adherent to the wound. Also felt that the wrap was too tight 3 layer being changed at the [facility] 6/24; patient's wound is small still adherent debris over the surface however even under illumination it was hard to see what was still open here. We have been using silver collagen 7/1; the patient arrives in the clinic today and the area on the right lower leg is healed. He has severe chronic venous insufficiency. He has 20/30 compression stockings. Readmission: 06/22/2020 on evaluation today patient presents for reevaluation here in our clinic although it has been a little over a year since we last saw him. He does have a history of lymphedema, venous insufficiency, anticoagulant therapy, he is on a pacemaker which is the reason for the anticoagulants, hypertension, and congestive heart failure. With that being said unfortunately he has been dealing with a wound of the left and right legs which has been giving him some trouble since arrival first 2021. That is in regard to the left leg ulcer. In regard to the right leg ulcer this is just a very small area that really I think will seal up quite nicely is more of the lymphedema opening than anything else. With that being said unfortunately the left leg is quite significant. We actually have noted that the patient's been dealing with this for quite some time and I think that there is a chance we may want to consider biopsy. With that being said he is on Coumadin therefore we were not able to perform a biopsy at this point. I think that something however in the future that may be warranted we may just have to take him off of the  current Coumadin regimen in order to do this. We discussed doing it today but I am more concerned about the fact that he could have issues with uncontrolled bleeding and in that case we may have to send him to the ER. His daughter was not wanting to take that chance which I can completely understand. Nonetheless so far they have been using antibiotic ointment over the area I am just putting a protective dressing I do  believe the patient is that he needs some type of compression. 07/07/2020 on evaluation today patient appears to be doing poorly in regard to his bilateral lower extremities today. He actually went to urgent care last night due to having pain in his legs due to the wraps. It appears they got somewhat tight on him because of the fact that he is having such significant swelling. He appears to be fluid overloaded I do not see any signs of infection actively at this point which is good news but unfortunately I think that this is something that may need to be addressed even by cardiology more so than just with a different or alternate type of wrap. I will contact his cardiologist, Dr. Mariah Milling, as well. 07/10/2020 upon evaluation today patient unfortunately appears to be doing worse even than when I saw him on Friday from the standpoint of where his legs stand. The main issue is that he has increased erythema although there is still not warm to touch I am concerned about infection and about this getting significantly worse. Again my initial concern was more fluid overload and I discussed this with his cardiology office on Friday. With that being said I feel like this has gotten worse his daughter is in agreement and I think that the best option would be to send him to the hospital at this point. READMISSION 09/06/2020 Is a patient who has had 2 stays in this clinic firstly in 2020 and more recently from 06/02/2020 through 07/10/2020. He was cared for by [facility]. When he was last here he had  bilateral lower extremity ulcers in the setting of chronic venous insufficiency with lymphedema. When he we last saw him he had weeping edema fluido Cellulitis and he was sent to the hospital. He was admitted initially from 06/30/2020 through 07/14/2020 [facility], Strother A. ([id number]) with cellulitis plus stasis dermatitis. Treated empirically with vancomycin and had increases in his torsemide. He was then sent to the skilled level of [facility] but readmitted to hospital from 07/29/2020 through 08/10/2020 with blood loss anemia hemoglobin of 4 and OB positive stools. During this hospitalization he was noted to have a sacral wound at stage II. He was sent to [facility] again in the skilled level. He had compression on his legs with Coban. He is now back at his independent living setting. He has not been wearing compression and his daughter feels there is already increasing swelling. To have stockings at home not exactly sure at what strength. Past medical history includes lymphedema, chronic venous insufficiency, pacemaker, congestive heart failure, hypertension, aortic valve stenosis status post mechanical valve replacement in 2002 on chronic Coumadin, chronic kidney disease stage IIIb, atrial fibrillation, peripheral neuropathy, large hiatal hernia. We did not test his ABIs in the clinic today. Readmission: 10/17/2020 on evaluation today patient appears to be doing somewhat poorly in regard to his left leg and the fact that he does have an open wound at this point. He has not had the compression socks placed at the facility they have not been putting them on at all unfortunately. He does not have on the right leg currently and apparently there is significant put him on the left leg with the wound which I understand is that can be somewhat tight obviously. Nonetheless I do believe the right leg needs to have a compression sock and I do believe on the left side right now we can do  something a little better in order to try to  get the wound healed at this point. The patient is on torsemide and currently allopurinol for gout. He also has daily weights due to congestive heart failure I am assuming in order to monitor his fluid status overall. He is currently a DNR at the facility. 10/30/2020 upon evaluation today patient appears to be doing somewhat poorly at this time in regard to his bilateral lower extremities. His left leg which is the only place where he had a wound last week when I saw him is actually doing worse with new wound openings. The right leg also has a small wound as well. However the most concerning thing is that last week he did not have any signs of infection or erythema there is no warmth to touch over the leg. This week the entire leg is more sore he also has erythema extending from the ankle to just below the knee with the area being erythematous and warm to touch which I believe represents cellulitis at minimum. I am concerned about how this is spread so quickly and the fact that he is having increased pain and also not feeling as well. All in all I discussed with the patient that unfortunately I feel like he may be best served going to the ER for further evaluation of this issue currently to try to see about the potential for IV antibiotics and to have appropriate lab work performed. His daughter is present during the visit today and she was included in the decision making at this point. 11/13/2020 upon evaluation today patient appears to be doing well with regard to his leg ulcer. Fortunately he does not appear to be showing signs of infection anything like it was previous. This is great news. He was sent home with IV antibiotic therapy that seems to have done extremely well for him. No fevers, chills, nausea, vomiting, or diarrhea. 11/27/2020 upon evaluation today patient appears to be doing quite well in regard to his wounds currently. Fortunately there is no  signs of active infection at this time which is great news and I am very pleased in this regard. He did have some concerns or his daughter did about an area on his gluteal region. With that being said I see nothing that is actually open at this point which is great news I do think continuing with a derma cloud is a good way to go. With regard to his leg he does have a new area on the right leg of open wound locations which is new compared to last visit we will need to address this as well. Otherwise he seems to be doing quite well in my opinion. 12/15/2020 upon evaluation today patient appears to be doing well with regard to his leg ulcer on the left. With that being said he did not have the Tubigrip on which I think is integral to getting this closed as he continues to weep. Subsequently I think the Tubigrip will help control some of the edema which will help him in that regard. Fortunately there is no signs of active infection at this time. No fevers, chills, nausea, vomiting, or diarrhea. 01/01/2021 upon evaluation today patient appears to be doing well with regard to his wound. This is measuring better and actually is significantly smaller. Fortunately there is no signs of active infection at this time. No fevers, chills, nausea, vomiting, or diarrhea. 01/15/2021 on evaluation today patient appears to be doing well with regard to his left leg ulcer though he still continues to weep quite  a bit he did not have the Tubigrip on the day that he supposed to. Fortunately there is no signs of active infection at this time. No fevers, chills, nausea, vomiting, or diarrhea. 01/29/2021 upon evaluation today patient appears to be doing well with regard to his right leg which is completely healed. His left leg though not healed is just a very small open area that is weeping. With regard to his gluteal region this is actually new and in fact he has a wound that is really in the sacral area. This is stated to be a  stage II that may be the case there is really no slough buildup I Ernie Hew leave it as such for the time being. We will keep a close eye on things however and see how it proceeds. 02/12/2021 upon evaluation today patient appears to be doing excellent in regard to his leg ulcers. In fact everything appears to be closed as best I can tell at this point. I think he is ready to transition into his compression stocking. With that being said his gluteal region still is open although it appears to be fairly clean I think the collagen is still a good thing to do here. We will see how things continue to improve over the next week or so. 02/26/2021 on evaluation today patient appears to be doing well with regard to his wound. He is tolerating the dressing changes without complication the wound is smaller though I feel like he is at the point where we need to try to do something to dry this up a little bit more I think switching from silver collagen to a silver alginate would be beneficial. 03/16/2021 upon evaluation today patient appears to be doing excellent in regard to his wound. He has been tolerating the dressing changes without complication. Fortunately there is no signs of active infection at this time. No fevers, chills, nausea, vomiting, or diarrhea. 04/02/2021 upon evaluation today patient appears to be doing well with regard to his wound. He really does not have anything terribly open but nonetheless he still has a lot of redness in the sacral area I think there is still a lot of pressure getting to this region. With that being said I think he really needs to have more appropriate offloading I think that may be the biggest issue he spends a lot of time sitting in his recliner from what I am hearing. 5/23; there has been some improvement in the sacral area not so much in the area on the left buttock. Both of these very tiny punched-out areas with surrounding erythema. They seem to be tender out of proportion  to what you might expect looking at them. We have been using silver alginate on this area 04/30/2021 upon evaluation today patient appears to be doing a little bit more poorly in regard to the wounds in the gluteal region and sacral Blakney, Maks A. ([id number]) region. Fortunately there does not appear to be any signs of active infection which is great news but I do feel like there is some additional pressure injury here. I did discuss with the patient again today trying to keep pressure off the area I think this is still of utmost importance. Fortunately there is no signs of active infection systemically which is great news. 05/14/2021 upon evaluation today patient appears to be doing about the same in regard to his ulcer in the sacral region. Fortunately there does not appear to be any signs of active infection which is  great news and overall very pleased in that regard. With that being said he does have some bleeding when cleansed with saline and gauze over the central portion of the wound I think this is indicative of the better wound bed which is at least good news. 06/01/2021 upon evaluation today patient's wounds actually appear to be doing better one is healed the central is still open but seems to be doing excellent. There is minimal slough noted. Objective Constitutional Well-nourished and well-hydrated in no acute distress. Vitals Time Taken: 10:04 AM, Temperature: 97.6 F, Pulse: 65 bpm, Respiratory Rate: 18 breaths/min, Blood Pressure: 118/54 mmHg. Respiratory normal breathing without difficulty. Psychiatric this patient is able to make decisions and demonstrates good insight into disease process. Alert and Oriented x 3. pleasant and cooperative. General Notes: Upon inspection patient's wound bed actually showed signs of good granulation epithelization at this point. There does not appear to be any evidence of active infection. The good news is I feel like that the patient is showing  signs of excellent improvement which is great news as well. Integumentary (Hair, Skin) Wound #12 status is Open. Original cause of wound was Pressure Injury. The date acquired was: 01/18/2021. The wound has been in treatment 17 weeks. The wound is located on the Sacrum. The wound measures 0.5cm length x 0.3cm width x 0.1cm depth; 0.118cm^2 area and 0.012cm^3 volume. There is Fat Layer (Subcutaneous Tissue) exposed. There is no tunneling or undermining noted. There is a small amount of serous drainage noted. The wound margin is flat and intact. There is medium (34-66%) red, pink granulation within the wound bed. There is a medium (34-66%) amount of necrotic tissue within the wound bed including Adherent Slough. Wound #13 status is Healed - Epithelialized. Original cause of wound was Shear/Friction. The date acquired was: 03/31/2021. The wound has been in treatment 8 weeks. The wound is located on the Left Gluteus. The wound measures 0cm length x 0cm width x 0cm depth; 0cm^2 area and 0cm^3 volume. There is no tunneling or undermining noted. There is a none present amount of drainage noted. The wound margin is flat and intact. There is no granulation within the wound bed. There is no necrotic tissue within the wound bed. 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 Mcquaid, Jaivion A. ([id number]) Edema Control - Lymphedema / Segmental Compressive Device / Other: Patient to wear own compression stockings. Remove compression stockings every night before going to bed and put on every morning when getting up. - bilat lower leg Elevate legs to the level  of the heart and pump ankles as often as possible Elevate leg(s) parallel to the floor when sitting. Off-Loading: Gel wheelchair cushion - keep gel cushion under him when out of bed at all times Low air-loss mattress (Group 2) - needs an air mattress to offload pressure off of wounds Turn and reposition every 2 hours - keep off of sacral wound area/REPOSITION EVERY 2 HOURS FOR WOUND HEALIN Additional Orders / Instructions: Wound #12 Sacrum: Other: - KEEP DRESSINGS ON BOTH SACRAL WOUNDS/FOLLOW WOUND CARE ORDERS WOUND #12: - Sacrum Wound Laterality: Cleanser: Normal Saline (Generic) 3 x Per Week/30 Days Discharge Instructions: Wash your hands with soap and water. Remove old dressing, discard into plastic bag  and place into trash. Cleanse the wound with Normal Saline prior to applying a clean dressing using gauze sponges, not tissues or cotton balls. Do not scrub or use excessive force. Pat dry using gauze sponges, not tissue or cotton balls. Primary Dressing: Hydrofera Blue Ready Transfer Foam, 2.5x2.5 (in/in) 3 x Per Week/30 Days Discharge Instructions: Apply Hydrofera Blue Ready to wound bed as directed Secondary Dressing: Mepilex Border Flex, 4x4 (in/in) (Generic) 3 x Per Week/30 Days Discharge Instructions: Apply to wound as directed. Do not cut. 1. Would recommend that we going continue with wound care measures as before and the patient is in agreement with plan this includes the use of the [facility] which I think is still doing a good job. 2. I am also can recommend that we continue with the border foam dressing to cover. 3. The patient did comment on the fact that his wound has not been hurting but this morning was hurting a lot more. With that being said he did not have a dressing upon arrival today at all not even a Band-Aid this is probably why it was bothering more. We will see patient back for reevaluation in 1 week here in the clinic. If anything worsens or changes patient  will contact our office for additional recommendations. Electronic Signature(s) Signed: 06/01/2021 10:26:44 AM By: Lenda Kelp PA-C Entered By: Lenda Kelp on 06/01/2021 10:26:44 Joellyn Quails ([id number]) -------------------------------------------------------------------------------- SuperBill Details Patient Name: [name] Hilts A. Date of Service: 06/01/2021 Medical Record Number: [id number] Patient Account Number: [id number] Date of Birth/Sex: 05/26/1931 (87 y.o. M) Treating RN: Yevonne Pax Primary Care Provider: [name] Other Clinician: Referring Provider: [name] Treating Provider/Extender: Rowan Blase in Treatment: 32 Diagnosis Coding ICD-10 Codes Code Description [phone] Pressure ulcer of sacral region, stage 2 Z79.01 Long term (current) use of anticoagulants Z95.0 Presence of cardiac pacemaker I10 Essential (primary) hypertension I50.42 Chronic combined systolic (congestive) and diastolic (congestive) heart failure I25.10 Atherosclerotic heart disease of native coronary artery without angina pectoris Facility Procedures CPT4 Code: [id number] Description: 99213 - WOUND CARE VISIT-LEV 3 EST PT Modifier: Quantity: 1 Physician Procedures CPT4 Code: [id number] Description: 99213 - WC PHYS LEVEL 3 - EST PT Modifier: Quantity: 1 CPT4 Code: Description: ICD-10 Diagnosis Description L89.152 Pressure ulcer of sacral region, stage 2 Z79.01 Long term (current) use of anticoagulants Z95.0 Presence of cardiac pacemaker I10 Essential (primary) hypertension Modifier: Quantity: Electronic Signature(s) Signed: 06/01/2021 10:27:45 AM By: Lenda Kelp PA-C Entered By: Lenda Kelp on 06/01/2021 10:27:45

## 2021-06-05 NOTE — Progress Notes (Signed)
VIET, KEMMERER ([id number]) Visit Report for 06/01/2021 Arrival Information Details Patient Name: [name], [name] Date of Service: 06/01/2021 10:00 AM Medical Record Number: [id number] Patient Account Number: [id number] Date of Birth/Sex: 1931-05-03 (87 y.o. M) Treating RN: Yevonne Pax Primary Care Aleksandar Duve: [name] Other Clinician: Referring Devin Foskey: [name] Treating Esparanza Krider/Extender: Rowan Blase in Treatment: 32 Visit Information History Since Last Visit All ordered tests and consults were completed: No Patient Arrived: Wheel Chair Added or deleted any medications: No Arrival Time: 10:01 Any new allergies or adverse reactions: No Accompanied By: self Had a fall or experienced change in No Transfer Assistance: None activities of daily living that may affect Patient Identification Verified: Yes risk of falls: Secondary Verification Process Completed: Yes Signs or symptoms of abuse/neglect since last visito No Patient Requires Transmission-Based Precautions: No Hospitalized since last visit: No Patient Has Alerts: No Implantable device outside of the clinic excluding No cellular tissue based products placed in the center since last visit: Has Dressing in Place as Prescribed: Yes Pain Present Now: No Electronic Signature(s) Signed: 06/04/2021 7:11:39 PM By: Yevonne Pax RN Entered By: Yevonne Pax on 06/01/2021 10:04:29 [name] ([id number]) -------------------------------------------------------------------------------- Clinic Level of Care Assessment Details Patient Name: [name] Date of Service: 06/01/2021 10:00 AM Medical Record Number: [id number] Patient Account Number: [id number] Date of Birth/Sex: Aug 16, 1931 (87 y.o. M) Treating RN: Yevonne Pax Primary Care Adebayo Ensminger: [name] Other Clinician: Referring Lennox Leikam: [name] Treating Adin Lariccia/Extender: Rowan Blase in Treatment: 32 Clinic Level of Care Assessment  Items TOOL 4 Quantity Score X - Use when only an EandM is performed on FOLLOW-UP visit 1 0 ASSESSMENTS - Nursing Assessment / Reassessment X - Reassessment of Co-morbidities (includes updates in patient status) 1 10 X- 1 5 Reassessment of Adherence to Treatment Plan ASSESSMENTS - Wound and Skin Assessment / Reassessment X - Simple Wound Assessment / Reassessment - one wound 1 5 []  - 0 Complex Wound Assessment / Reassessment - multiple wounds []  - 0 Dermatologic / Skin Assessment (not related to wound area) ASSESSMENTS - Focused Assessment []  - Circumferential Edema Measurements - multi extremities 0 []  - 0 Nutritional Assessment / Counseling / Intervention []  - 0 Lower Extremity Assessment (monofilament, tuning fork, pulses) []  - 0 Peripheral Arterial Disease Assessment (using hand held doppler) ASSESSMENTS - Ostomy and/or Continence Assessment and Care []  - Incontinence Assessment and Management 0 []  - 0 Ostomy Care Assessment and Management (repouching, etc.) PROCESS - Coordination of Care X - Simple Patient / Family Education for ongoing care 1 15 []  - 0 Complex (extensive) Patient / Family Education for ongoing care []  - 0 Staff obtains , Records, Test Results / Process Orders []  - 0 Staff telephones HHA, Nursing Homes / Clarify orders / etc []  - 0 Routine Transfer to another Facility (non-emergent condition) []  - 0 Routine Hospital Admission (non-emergent condition) []  - 0 New Admissions / / Ordering NPWT, Apligraf, etc. []  - 0 Emergency Hospital Admission (emergent condition) X- 1 10 Simple Discharge Coordination []  - 0 Complex (extensive) Discharge Coordination PROCESS - Special Needs []  - Pediatric / Minor Patient Management 0 []  - 0 Isolation Patient Management []  - 0 Hearing / Language / Visual special needs []  - 0 Assessment of Community assistance (transportation, D/C planning, etc.) []  - 0 Additional assistance /  Altered mentation []  - 0 Support Surface(s) Assessment (bed, cushion, seat, etc.) INTERVENTIONS - Wound Cleansing / Measurement Weinreb, Kodee A. ( ) X- 1 5 Simple Wound Cleansing -  one wound []  - 0 Complex Wound Cleansing - multiple wounds X- 1 5 Wound Imaging (photographs - any number of wounds) []  - 0 Wound Tracing (instead of photographs) X- 1 5 Simple Wound Measurement - one wound []  - 0 Complex Wound Measurement - multiple wounds INTERVENTIONS - Wound Dressings X - Small Wound Dressing one or multiple wounds 1 10 []  - 0 Medium Wound Dressing one or multiple wounds []  - 0 Large Wound Dressing one or multiple wounds X- 1 5 Application of Medications - topical []  - 0 Application of Medications - injection INTERVENTIONS - Miscellaneous []  - External ear exam 0 []  - 0 Specimen Collection (cultures, biopsies, blood, body fluids, etc.) []  - 0 Specimen(s) / Culture(s) sent or taken to Lab for analysis []  - 0 Patient Transfer (multiple staff / Nurse, adultHoyer Lift / Similar devices) []  - 0 Simple Staple / Suture removal (25 or less) []  - 0 Complex Staple / Suture removal (26 or more) []  - 0 Hypo / Hyperglycemic Management (close monitor of Blood Glucose) []  - 0 Ankle / Brachial Index (ABI) - do not check if billed separately X- 1 5 Vital Signs Has the patient been seen at the hospital within the last three years: Yes Total Score: 80 Level Of Care: New/Established - Level 3 Electronic Signature(s) Signed: 06/04/2021 7:11:39 PM By: Yevonne PaxEpps, Carrie RN Entered By: Yevonne PaxEpps, Carrie on 06/01/2021 10:22:19 [name], Nazaire A. ([id number]) -------------------------------------------------------------------------------- Encounter Discharge Information Details Patient Name: [name], Jenkins A. Date of Service: 06/01/2021 10:00 AM Medical Record Number: [id number] Patient Account Number: [id number] Date of Birth/Sex: July 27, 1931 (87 y.o. M) Treating RN: Yevonne PaxEpps, Carrie Primary Care Marizol Borror:  Marcelino DusterJohnston, John Other Clinician: Referring Crandall Harvel: Marcelino DusterJohnston, John Treating Kabeer Hoagland/Extender: Rowan BlaseStone, Hoyt Weeks in Treatment: 32 Encounter Discharge Information Items Discharge Condition: Stable Ambulatory Status: Wheelchair Discharge Destination: Home Transportation: Private Auto Accompanied By: self Schedule Follow-up Appointment: Yes Clinical Summary of Care: Patient Declined Electronic Signature(s) Signed: 06/04/2021 7:11:39 PM By: Yevonne PaxEpps, Carrie RN Entered By: Yevonne PaxEpps, Carrie on 06/01/2021 10:23:38 [name], Eryn A. ([id number]) -------------------------------------------------------------------------------- Lower Extremity Assessment Details Patient Name: [name], Lexus A. Date of Service: 06/01/2021 10:00 AM Medical Record Number: [id number] Patient Account Number: [id number] Date of Birth/Sex: July 27, 1931 (87 y.o. M) Treating RN: Yevonne PaxEpps, Carrie Primary Care Jerzey Komperda: Marcelino DusterJohnston, John Other Clinician: Referring Arena Lindahl: Marcelino DusterJohnston, John Treating Kamden Stanislaw/Extender: Allen DerryStone, Hoyt Weeks in Treatment: 32 Electronic Signature(s) Signed: 06/04/2021 7:11:39 PM By: Yevonne PaxEpps, Carrie RN Entered By: Yevonne PaxEpps, Carrie on 06/01/2021 10:14:16 [name], Melissa A. ([id number]) -------------------------------------------------------------------------------- Multi Wound Chart Details Patient Name: [name], Rayhan A. Date of Service: 06/01/2021 10:00 AM Medical Record Number: [id number] Patient Account Number: [id number] Date of Birth/Sex: July 27, 1931 (87 y.o. M) Treating RN: Yevonne PaxEpps, Carrie Primary Care Yittel Emrich: Marcelino DusterJohnston, John Other Clinician: Referring Partick Musselman: Marcelino DusterJohnston, John Treating Alick Lecomte/Extender: Rowan BlaseStone, Hoyt Weeks in Treatment: 32 Vital Signs Height(in): Pulse(bpm): 65 Weight(lbs): Blood Pressure(mmHg): 118/54 Body Mass Index(BMI): Temperature(F): 97.6 Respiratory Rate(breaths/min): 18 Photos: [N/A:N/A] Wound Location: Sacrum Left Gluteus N/A Wounding Event: Pressure Injury Shear/Friction N/A Primary  Etiology: Pressure Ulcer Pressure Ulcer N/A Comorbid History: Cataracts, Sleep Apnea, Congestive Cataracts, Sleep Apnea, Congestive N/A Heart Failure, Coronary Artery Heart Failure, Coronary Artery Disease, Hypertension, Disease, Hypertension, Osteoarthritis Osteoarthritis Date Acquired: 01/18/2021 03/31/2021 N/A Weeks of Treatment: 17 8 N/A Wound Status: Open Open N/A Measurements L x W x D (cm) 0.5x0.3x0.1 0.1x0.1x0.1 N/A Area (cm) : 0.118 0.008 N/A Volume (cm) : 0.012 0.001 N/A % Reduction in Area: 64.20% 88.70% N/A % Reduction in Volume: 87.90% 85.70% N/A Classification: Category/Stage II Category/Stage II N/A  Exudate Amount: Small Small N/A Exudate Type: Serous Serous N/A Exudate Color: amber amber N/A Wound Margin: Flat and Intact Flat and Intact N/A Granulation Amount: Medium (34-66%) None Present (0%) N/A Granulation Quality: Red, Pink N/A N/A Necrotic Amount: Medium (34-66%) Large (67-100%) N/A Necrotic Tissue: Adherent Slough Eschar N/A Exposed Structures: Fat Layer (Subcutaneous Tissue): Fascia: No N/A Yes Fat Layer (Subcutaneous Tissue): Fascia: No No Tendon: No Tendon: No Muscle: No Muscle: No Joint: No Joint: No Bone: No Bone: No Epithelialization: None None N/A Treatment Notes Electronic Signature(s) Signed: 06/04/2021 7:11:39 PM By: Yevonne Pax RN Entered By: Yevonne Pax on 06/01/2021 10:18:33 [name] ([id number]) [name], Warden A. ([id number]) -------------------------------------------------------------------------------- Multi-Disciplinary Care Plan Details Patient Name: [name] Date of Service: 06/01/2021 10:00 AM Medical Record Number: [id number] Patient Account Number: [id number] Date of Birth/Sex: 08/06/1931 (87 y.o. M) Treating RN: Yevonne Pax Primary Care Tajee Savant: [name] Other Clinician: Referring Tayden Duran: [name] Treating Hiro Vipond/Extender: Allen Derry Weeks in Treatment: 32 Active Inactive Electronic  Signature(s) Signed: 06/04/2021 7:11:39 PM By: Yevonne Pax RN Entered By: Yevonne Pax on 06/01/2021 10:18:12 [name] ([id number]) -------------------------------------------------------------------------------- Pain Assessment Details Patient Name: [name] Date of Service: 06/01/2021 10:00 AM Medical Record Number: [id number] Patient Account Number: [id number] Date of Birth/Sex: [birth year] (87 y.o. M) Treating RN: Yevonne Pax Primary Care Reilly Blades: [name] Other Clinician: Referring Oree Mirelez: [name] Treating Trent Gabler/Extender: Rowan Blase in Treatment: 32 Active Problems Location of Pain Severity and Description of Pain Patient Has Paino No Site Locations Pain Management and Medication Current Pain Management: Electronic Signature(s) Signed: 06/04/2021 7:11:39 PM By: Yevonne Pax RN Entered By: Yevonne Pax on 06/01/2021 10:12:02 Joellyn Quails ([id number]) -------------------------------------------------------------------------------- Patient/Caregiver Education Details Patient Name: [name] Date of Service: 06/01/2021 10:00 AM Medical Record Number: [id number] Patient Account Number: [id number] Date of Birth/Gender: June 07, 1931 (87 y.o. M) Treating RN: Yevonne Pax Primary Care Physician: [name] Other Clinician: Referring Physician: [name] Treating Physician/Extender: Rowan Blase in Treatment: 32 Education Assessment Education Provided To: Patient Education Topics Provided Wound/Skin Impairment: Methods: Explain/Verbal Responses: State content correctly Electronic Signature(s) Signed: 06/04/2021 7:11:39 PM By: Yevonne Pax RN Entered By: Yevonne Pax on 06/01/2021 10:22:39 [name] ([id number]) -------------------------------------------------------------------------------- Wound Assessment Details Patient Name: [name] Date of Service: 06/01/2021 10:00 AM Medical Record Number:  [id number] Patient Account Number: [id number] Date of Birth/Sex: 23-Aug-1931 (87 y.o. M) Treating RN: Yevonne Pax Primary Care Ivet Guerrieri: [name] Other Clinician: Referring Darreon Lutes: [name] Treating Ikechukwu Cerny/Extender: Allen Derry Weeks in Treatment: 32 Wound Status Wound Number: 12 Primary Pressure Ulcer Etiology: Wound Location: Sacrum Wound Open Wounding Event: Pressure Injury Status: Date Acquired: 01/18/2021 Comorbid Cataracts, Sleep Apnea, Congestive Heart Failure, Weeks Of Treatment: 17 History: Coronary Artery Disease, Hypertension, Osteoarthritis Clustered Wound: No Photos Wound Measurements Length: (cm) 0.5 Width: (cm) 0.3 Depth: (cm) 0.1 Area: (cm) 0.118 Volume: (cm) 0.012 % Reduction in Area: 64.2% % Reduction in Volume: 87.9% Epithelialization: None Tunneling: No Undermining: No Wound Description Classification: Category/Stage II Wound Margin: Flat and Intact Exudate Amount: Small Exudate Type: Serous Exudate Color: amber Foul Odor After Cleansing: No Slough/Fibrino Yes Wound Bed Granulation Amount: Medium (34-66%) Exposed Structure Granulation Quality: Red, Pink Fascia Exposed: No Necrotic Amount: Medium (34-66%) Fat Layer (Subcutaneous Tissue) Exposed: Yes Necrotic Quality: Adherent Slough Tendon Exposed: No Muscle Exposed: No Joint Exposed: No Bone Exposed: No Treatment Notes Wound #12 (Sacrum) Cleanser Normal Saline Discharge Instruction: Wash your hands with soap and water. Remove old dressing, discard into  plastic bag and place into trash. Cleanse the wound with Normal Saline prior to applying a clean dressing using gauze sponges, not tissues or cotton balls. Do not scrub or use excessive force. Pat dry using gauze sponges, not tissue or cotton balls. NASEER, HEARN A. ([id number]) Peri-Wound Care Topical Primary Dressing Hydrofera Blue Ready Transfer Foam, 2.5x2.5 (in/in) Discharge Instruction: Apply Hydrofera Blue Ready to  wound bed as directed Secondary Dressing Mepilex Border Flex, 4x4 (in/in) Discharge Instruction: Apply to wound as directed. Do not cut. Secured With Compression Wrap Compression Stockings Facilities manager) Signed: 06/04/2021 7:11:39 PM By: Yevonne Pax RN Entered By: Yevonne Pax on 06/01/2021 10:13:01 [name] ([id number]) -------------------------------------------------------------------------------- Wound Assessment Details Patient Name: [name] Date of Service: 06/01/2021 10:00 AM Medical Record Number: [id number] Patient Account Number: [id number] Date of Birth/Sex: [birth year] (87 y.o. M) Treating RN: Yevonne Pax Primary Care Dessa Ledee: [name] Other Clinician: Referring Keandre Linden: [name] Treating Psalm Schappell/Extender: Rowan Blase in Treatment: 32 Wound Status Wound Number: 13 Primary Pressure Ulcer Etiology: Wound Location: Left Gluteus Wound Healed - Epithelialized Wounding Event: Shear/Friction Status: Date Acquired: 03/31/2021 Comorbid Cataracts, Sleep Apnea, Congestive Heart Failure, Weeks Of Treatment: 8 History: Coronary Artery Disease, Hypertension, Osteoarthritis Clustered Wound: No Photos Wound Measurements Length: (cm) 0 % Red Width: (cm) 0 % Red Depth: (cm) 0 Epith Area: (cm) 0 Tunn Volume: (cm) 0 Unde uction in Area: 100% uction in Volume: 100% elialization: Large (67-100%) eling: No rmining: No Wound Description Classification: Category/Stage II Foul Wound Margin: Flat and Intact Slou Exudate Amount: None Present Odor After Cleansing: No gh/Fibrino No Wound Bed Granulation Amount: None Present (0%) Exposed Structure Necrotic Amount: None Present (0%) Fascia Exposed: No Fat Layer (Subcutaneous Tissue) Exposed: No Tendon Exposed: No Muscle Exposed: No Joint Exposed: No Bone Exposed: No Treatment Notes Wound #13 (Gluteus) Wound Laterality: Left Cleanser Peri-Wound Care Topical Primary  Dressing DARVELL, MONTEFORTE A. ([id number]) Secondary Dressing Secured With Compression Wrap Compression Stockings Add-Ons Electronic Signature(s) Signed: 06/04/2021 7:11:39 PM By: Yevonne Pax RN Entered By: Yevonne Pax on 06/01/2021 10:20:48 [name] ([id number]) -------------------------------------------------------------------------------- Vitals Details Patient Name: [name] Date of Service: 06/01/2021 10:00 AM Medical Record Number: [id number] Patient Account Number: [id number] Date of Birth/Sex: 1931-04-30 (87 y.o. M) Treating RN: Yevonne Pax Primary Care Jabarri Stefanelli: [name] Other Clinician: Referring Willo Yoon: [name] Treating Jacari Kirsten/Extender: Allen Derry Weeks in Treatment: 32 Vital Signs Time Taken: 10:04 Temperature (F): 97.6 Pulse (bpm): 65 Respiratory Rate (breaths/min): 18 Blood Pressure (mmHg): 118/54 Reference Range: 80 - 120 mg / dl Electronic Signature(s) Signed: 06/04/2021 7:11:39 PM By: Yevonne Pax RN Entered By: Yevonne Pax on 06/01/2021 10:11:47

## 2021-06-11 ENCOUNTER — Other Ambulatory Visit: Payer: Self-pay

## 2021-06-11 ENCOUNTER — Encounter: Payer: Medicare Other | Admitting: Physician Assistant

## 2021-06-11 DIAGNOSIS — L89152 Pressure ulcer of sacral region, stage 2: Secondary | ICD-10-CM | POA: Diagnosis not present

## 2021-06-11 NOTE — Progress Notes (Addendum)
[name], [name] ([id number]) Visit Report for 06/11/2021 Arrival Information Details Patient Name: [name], [name] Date of Service: 06/11/2021 11:15 AM Medical Record Number: [id number] Patient Account Number: [id number] Date of Birth/Sex: November 21, 1931 (87 y.o. M) Treating RN: Hansel Feinstein Primary Care Embree Brawley: [name] Other Clinician: Referring Dayami Taitt: [name] Treating Lashaunta Sicard/Extender: Rowan Blase in Treatment: 33 Visit Information History Since Last Visit Added or deleted any medications: No Patient Arrived: Wheel Chair Had a fall or experienced change in No Arrival Time: 11:19 activities of daily living that may affect Accompanied By: daughter risk of falls: Transfer Assistance: Easy[employer] Patient Hospitalized since last visit: No Lift Has Dressing in Place as Prescribed: Yes Patient Identification Verified: Yes Pain Present Now: Yes Secondary Verification Process Completed: Yes Patient Requires Transmission-Based No Precautions: Patient Has Alerts: No Electronic Signature(s) Signed: 06/11/2021 2:49:51 PM By: Hansel Feinstein Entered By: Hansel Feinstein on 06/11/2021 11:19:48 Brine, Bane A. ([id number]) -------------------------------------------------------------------------------- Clinic Level of Care Assessment Details Patient Name: [name] Hilts A. Date of Service: 06/11/2021 11:15 AM Medical Record Number: [id number] Patient Account Number: [id number] Date of Birth/Sex: February 16, 1931 (87 y.o. M) Treating RN: Hansel Feinstein Primary Care Deardra Hinkley: [name] Other Clinician: Referring Bertel Venard: [name] Treating Venola Castello/Extender: Rowan Blase in Treatment: 33 Clinic Level of Care Assessment Items TOOL 4 Quantity Score []  - Use when only an EandM is performed on FOLLOW-UP visit 0 ASSESSMENTS - Nursing Assessment / Reassessment []  - Reassessment of Co-morbidities (includes updates in patient status) 0 []  - 0 Reassessment of Adherence to  Treatment Plan ASSESSMENTS - Wound and Skin Assessment / Reassessment X - Simple Wound Assessment / Reassessment - one wound 1 5 []  - 0 Complex Wound Assessment / Reassessment - multiple wounds []  - 0 Dermatologic / Skin Assessment (not related to wound area) ASSESSMENTS - Focused Assessment []  - Circumferential Edema Measurements - multi extremities 0 []  - 0 Nutritional Assessment / Counseling / Intervention []  - 0 Lower Extremity Assessment (monofilament, tuning fork, pulses) []  - 0 Peripheral Arterial Disease Assessment (using hand held doppler) ASSESSMENTS - Ostomy and/or Continence Assessment and Care []  - Incontinence Assessment and Management 0 []  - 0 Ostomy Care Assessment and Management (repouching, etc.) PROCESS - Coordination of Care X - Simple Patient / Family Education for ongoing care 1 15 []  - 0 Complex (extensive) Patient / Family Education for ongoing care []  - 0 Staff obtains , Records, Test Results / Process Orders []  - 0 Staff telephones HHA, Nursing Homes / Clarify orders / etc []  - 0 Routine Transfer to another Facility (non-emergent condition) []  - 0 Routine Hospital Admission (non-emergent condition) []  - 0 New Admissions / / Ordering NPWT, Apligraf, etc. []  - 0 Emergency Hospital Admission (emergent condition) X- 1 10 Simple Discharge Coordination []  - 0 Complex (extensive) Discharge Coordination PROCESS - Special Needs []  - Pediatric / Minor Patient Management 0 []  - 0 Isolation Patient Management []  - 0 Hearing / Language / Visual special needs []  - 0 Assessment of Community assistance (transportation, D/C planning, etc.) []  - 0 Additional assistance / Altered mentation []  - 0 Support Surface(s) Assessment (bed, cushion, seat, etc.) INTERVENTIONS - Wound Cleansing / Measurement Benedetti, Roch A. ( ) X- 1 5 Simple Wound Cleansing - one wound []  - 0 Complex Wound Cleansing - multiple wounds []  -  0 Wound Imaging (photographs - any number of wounds) []  - 0 Wound Tracing (instead of photographs) X- 1 5 Simple Wound Measurement - one wound []  - 0 Complex  Wound Measurement - multiple wounds INTERVENTIONS - Wound Dressings X - Small Wound Dressing one or multiple wounds 1 10 []  - 0 Medium Wound Dressing one or multiple wounds []  - 0 Large Wound Dressing one or multiple wounds []  - 0 Application of Medications - topical []  - 0 Application of Medications - injection INTERVENTIONS - Miscellaneous []  - External ear exam 0 []  - 0 Specimen Collection (cultures, biopsies, blood, body fluids, etc.) []  - 0 Specimen(s) / Culture(s) sent or taken to Lab for analysis []  - 0 Patient Transfer (multiple staff / Nurse, adultHoyer Lift / Similar devices) []  - 0 Simple Staple / Suture removal (25 or less) []  - 0 Complex Staple / Suture removal (26 or more) []  - 0 Hypo / Hyperglycemic Management (close monitor of Blood Glucose) []  - 0 Ankle / Brachial Index (ABI) - do not check if billed separately X- 1 5 Vital Signs Has the patient been seen at the hospital within the last three years: Yes Total Score: 55 Level Of Care: New/Established - Level 2 Electronic Signature(s) Signed: 06/11/2021 2:49:51 PM By: Hansel FeinsteinBishop, Joy Entered By: Hansel FeinsteinBishop, Joy on 06/11/2021 11:53:30 Rule, Gunnard A. ([id number]) -------------------------------------------------------------------------------- Complex / Palliative Patient Assessment Details Patient Name: [name] HiltsSCHENK, [name] Date of Service: 06/11/2021 11:15 AM Medical Record Number: [id number] Patient Account Number: [id number] Date of Birth/Sex: [birth year] (87 y.o. M) Treating RN: Hansel FeinsteinBishop, Joy Primary Care Chipper Koudelka: Marcelino DusterJohnston, John Other Clinician: Referring Neno Hohensee: Marcelino DusterJohnston, John Treating Danila Eddie/Extender: Rowan BlaseStone, Hoyt Weeks in Treatment: 33 Palliative Management Criteria Complex Wound Management Criteria Patient has remarkable or complex co-morbidities requiring  medications or treatments that extend wound healing times. Examples: o Diabetes mellitus with chronic renal failure or end stage renal disease requiring dialysis o Advanced or poorly controlled rheumatoid arthritis o Diabetes mellitus and end stage chronic obstructive pulmonary disease o Active cancer with current chemo- or radiation therapy cognitive decline with memory, lives in SNF, non compliant to offload wounds, CHF, CAD Care Approach Wound Care Plan: Complex Wound Management Electronic Signature(s) Signed: 06/18/2021 9:28:24 AM By: Hansel FeinsteinBishop, Joy Signed: 07/25/2021 5:26:53 PM By: Lenda KelpStone III, Hoyt PA-C Previous Signature: 06/18/2021 9:28:19 AM Version By: Hansel FeinsteinBishop, Joy Entered By: Hansel FeinsteinBishop, Joy on 06/18/2021 09:28:23 [name] HiltsSCHENK, Lj A. ([id number]) -------------------------------------------------------------------------------- Encounter Discharge Information Details Patient Name: [name] HiltsSCHENK, Augie A. Date of Service: 06/11/2021 11:15 AM Medical Record Number: [id number] Patient Account Number: [id number] Date of Birth/Sex: [birth year] (87 y.o. M) Treating RN: Hansel FeinsteinBishop, Joy Primary Care Lanasia Porras: Marcelino DusterJohnston, John Other Clinician: Referring Jylan Loeza: Marcelino DusterJohnston, John Treating Aldean Pipe/Extender: Rowan BlaseStone, Hoyt Weeks in Treatment: 33 Encounter Discharge Information Items Discharge Condition: Stable Ambulatory Status: Wheelchair Discharge Destination: Skilled Nursing Facility Telephoned: No Orders Sent: Yes Transportation: Other Accompanied By: daughter Schedule Follow-up Appointment: Yes Clinical Summary of Care: Electronic Signature(s) Signed: 06/11/2021 2:49:51 PM By: Hansel FeinsteinBishop, Joy Entered By: Hansel FeinsteinBishop, Joy on 06/11/2021 11:57:35 Belding, Vash A. ([id number]) -------------------------------------------------------------------------------- Lower Extremity Assessment Details Patient Name: [name] HiltsSCHENK, Takumi A. Date of Service: 06/11/2021 11:15 AM Medical Record Number: [id number] Patient Account  Number: [id number] Date of Birth/Sex: [birth year] (87 y.o. M) Treating RN: Hansel FeinsteinBishop, Joy Primary Care Esai Stecklein: Marcelino DusterJohnston, John Other Clinician: Referring Marka Treloar: Marcelino DusterJohnston, John Treating Cimone Fahey/Extender: Allen DerryStone, Hoyt Weeks in Treatment: 33 Electronic Signature(s) Signed: 06/11/2021 2:49:51 PM By: Hansel FeinsteinBishop, Joy Entered By: Hansel FeinsteinBishop, Joy on 06/11/2021 11:26:35 [name] HiltsSCHENK, Leiland A. ([id number]) -------------------------------------------------------------------------------- Multi Wound Chart Details Patient Name: [name] HiltsSCHENK, Bruce A. Date of Service: 06/11/2021 11:15 AM Medical Record Number: [id number] Patient Account Number: [id number] Date of Birth/Sex: [birth year] (87 y.o. M) Treating RN: Hansel FeinsteinBishop, Joy Primary Care Jermond Burkemper:  [name] Other Clinician: Referring Novali Vollman: [name] Treating Treyshawn Muldrew/Extender: Rowan Blase in Treatment: 33 Vital Signs Height(in): Pulse(bpm): 73 Weight(lbs): Blood Pressure(mmHg): 122/74 Body Mass Index(BMI): Temperature(F): 97.6 Respiratory Rate(breaths/min): 16 Photos: [N/A:N/A] Wound Location: Sacrum N/A N/A Wounding Event: Pressure Injury N/A N/A Primary Etiology: Pressure Ulcer N/A N/A Comorbid History: Cataracts, Sleep Apnea, Congestive N/A N/A Heart Failure, Coronary Artery Disease, Hypertension, Osteoarthritis Date Acquired: 01/18/2021 N/A N/A Weeks of Treatment: 19 N/A N/A Wound Status: Open N/A N/A Measurements L x W x D (cm) 0.5x0.3x0.1 N/A N/A Area (cm) : 0.118 N/A N/A Volume (cm) : 0.012 N/A N/A % Reduction in Area: 64.20% N/A N/A % Reduction in Volume: 87.90% N/A N/A Classification: Category/Stage II N/A N/A Exudate Amount: Small N/A N/A Exudate Type: Serous N/A N/A Exudate Color: amber N/A N/A Wound Margin: Flat and Intact N/A N/A Granulation Amount: Medium (34-66%) N/A N/A Granulation Quality: Red, Pink N/A N/A Necrotic Amount: Medium (34-66%) N/A N/A Exposed Structures: Fat Layer (Subcutaneous Tissue): N/A  N/A Yes Fascia: No Tendon: No Muscle: No Joint: No Bone: No Epithelialization: None N/A N/A Treatment Notes Electronic Signature(s) Signed: 06/11/2021 2:49:51 PM By: Hansel Feinstein Entered By: Hansel Feinstein on 06/11/2021 11:27:02 Joellyn Quails ([id number]) -------------------------------------------------------------------------------- Multi-Disciplinary Care Plan Details Patient Name: [name] Hilts A. Date of Service: 06/11/2021 11:15 AM Medical Record Number: [id number] Patient Account Number: [id number] Date of Birth/Sex: [birth year] (87 y.o. M) Treating RN: Hansel Feinstein Primary Care Lazaria Schaben: [name] Other Clinician: Referring Deara Bober: [name] Treating Tamyia Minich/Extender: Allen Derry Weeks in Treatment: 41 Active Inactive Electronic Signature(s) Signed: 06/11/2021 2:49:51 PM By: Hansel Feinstein Entered By: Hansel Feinstein on 06/11/2021 11:26:51 [name] Hilts A. ([id number]) -------------------------------------------------------------------------------- Pain Assessment Details Patient Name: [name] Hilts A. Date of Service: 06/11/2021 11:15 AM Medical Record Number: [id number] Patient Account Number: [id number] Date of Birth/Sex: [birth year] (87 y.o. M) Treating RN: Hansel Feinstein Primary Care Ourania Hamler: [name] Other Clinician: Referring Daleysa Kristiansen: [name] Treating Aaden Buckman/Extender: Rowan Blase in Treatment: 33 Active Problems Location of Pain Severity and Description of Pain Patient Has Paino Yes Site Locations Pain Location: Generalized Pain Rate the pain. Current Pain Level: 6 Pain Management and Medication Current Pain Management: Electronic Signature(s) Signed: 06/11/2021 2:49:51 PM By: Hansel Feinstein Entered By: Hansel Feinstein on 06/11/2021 11:20:49 [name] Hilts A. ([id number]) -------------------------------------------------------------------------------- Patient/Caregiver Education Details Patient Name: [name] Hilts A. Date of Service:  06/11/2021 11:15 AM Medical Record Number: [id number] Patient Account Number: [id number] Date of Birth/Gender: Nov 29, 1930 (87 y.o. M) Treating RN: Hansel Feinstein Primary Care Physician: [name] Other Clinician: Referring Physician: [name] Treating Physician/Extender: Rowan Blase in Treatment: 57 Education Assessment Education Provided To: Patient and Caregiver Education Topics Provided Basic Hygiene: Offloading: Wound/Skin Impairment: Psychologist, prison and probation services) Signed: 06/11/2021 2:49:51 PM By: Hansel Feinstein Entered By: Hansel Feinstein on 06/11/2021 11:53:47 Maciejewski, Keontae A. ([id number]) -------------------------------------------------------------------------------- Wound Assessment Details Patient Name: [name] Hilts A. Date of Service: 06/11/2021 11:15 AM Medical Record Number: [id number] Patient Account Number: [id number] Date of Birth/Sex: December 10, 1930 (87 y.o. M) Treating RN: Hansel Feinstein Primary Care Erice Ahles: [name] Other Clinician: Referring Jeno Calleros: [name] Treating Selah Klang/Extender: Allen Derry Weeks in Treatment: 33 Wound Status Wound Number: 12 Primary Pressure Ulcer Etiology: Wound Location: Sacrum Wound Open Wounding Event: Pressure Injury Status: Date Acquired: 01/18/2021 Comorbid Cataracts, Sleep Apnea, Congestive Heart Failure, Weeks Of Treatment: 19 History: Coronary Artery Disease, Hypertension, Osteoarthritis Clustered Wound: No Photos Wound Measurements Length: (cm) 0.5 Width: (cm) 0.3 Depth: (cm) 0.1 Area: (cm) 0.118 Volume: (cm) 0.012 % Reduction in Area:  64.2% % Reduction in Volume: 87.9% Epithelialization: None Tunneling: No Undermining: No Wound Description Classification: Category/Stage II Wound Margin: Flat and Intact Exudate Amount: Small Exudate Type: Serous Exudate Color: amber Foul Odor After Cleansing: No Slough/Fibrino Yes Wound Bed Granulation Amount: Medium (34-66%) Exposed Structure Granulation  Quality: Red, Pink Fascia Exposed: No Necrotic Amount: Medium (34-66%) Fat Layer (Subcutaneous Tissue) Exposed: Yes Necrotic Quality: Adherent Slough Tendon Exposed: No Muscle Exposed: No Joint Exposed: No Bone Exposed: No Electronic Signature(s) Signed: 06/11/2021 2:49:51 PM By: Hansel Feinstein Entered By: Hansel Feinstein on 06/11/2021 11:26:19 Trager, Brylin A. ([id number]) -------------------------------------------------------------------------------- Vitals Details Patient Name: [name] Hilts A. Date of Service: 06/11/2021 11:15 AM Medical Record Number: [id number] Patient Account Number: [id number] Date of Birth/Sex: 04/25/1931 (87 y.o. M) Treating RN: Hansel Feinstein Primary Care Dalesha Stanback: [name] Other Clinician: Referring Branna Cortina: [name] Treating Abie Cheek/Extender: Rowan Blase in Treatment: 33 Vital Signs Time Taken: 11:16 Temperature (F): 97.6 Pulse (bpm): 73 Respiratory Rate (breaths/min): 16 Blood Pressure (mmHg): 122/74 Reference Range: 80 - 120 mg / dl Electronic Signature(s) Signed: 06/11/2021 2:49:51 PM By: Hansel Feinstein Entered ByHansel Feinstein on 06/11/2021 11:20:07

## 2021-06-11 NOTE — Progress Notes (Addendum)
NYLEN, CREQUE ([id number]) Visit Report for 06/11/2021 Chief Complaint Document Details Patient Name: [name], [name] A. Date of Service: 06/11/2021 11:15 AM Medical Record Number: [id number] Patient Account Number: [id number] Date of Birth/Sex: [birth year] (87 y.o. M) Treating RN: Hansel Feinstein Primary Care Provider: [name] Other Clinician: Referring Provider: [name] Treating Provider/Extender: Rowan Blase in Treatment: 33 Information Obtained from: Patient Chief Complaint Sacral and Left Gluteal Ulcers Electronic Signature(s) Signed: 06/11/2021 11:27:39 AM By: Lenda Kelp PA-C Entered By: Lenda Kelp on 06/11/2021 11:27:39 [name] Hilts A. ([id number]) -------------------------------------------------------------------------------- HPI Details Patient Name: [name] Hilts A. Date of Service: 06/11/2021 11:15 AM Medical Record Number: [id number] Patient Account Number: [id number] Date of Birth/Sex: [birth year] (87 y.o. M) Treating RN: Hansel Feinstein Primary Care Provider: [name] Other Clinician: Referring Provider: [name] Treating Provider/Extender: Rowan Blase in Treatment: 33 History of Present Illness HPI Description: 04/21/2019 ADMISSION This is an independent 87 year old man who lives in the independent part of [address] of [place]. He has a history of chronic lower extremity edema and wears compression stockings. He states about a month ago he was taking off the one on the right and pulled some skin off accidentally. He has had 2 small wounds on the right anterior and right medial lower extremity. I note looking through [facility] health link that he had multiple venous ultrasounds in 2015 and 16. These were DVT rule outs. He did have a Baker's cyst on the left. He has not not had a previous wound history although he did have a history of cellulitis in his legs. Past medical history; includes aortic stenosis status post mechanical AVR in  2007 on chronic Coumadin, lower extremity edema with a history of cellulitis, history of MRSA. ABIs in our clinic were 1.1 on the right 6/3; patient with predominantly venous insufficiency ulcers in the right lower leg probably some degree of lymphedema. He had to wounds last week. We put him in 3 layer compression. The nurses at [facility] are changing the dressing. The area laterally is healed but he still has a small very painful area on the anterior tibial area. He is on Coumadin because of mechanical aortic valve. 6/10; venous insufficiency ulcers on the right lower leg. He has some degree of lymphedema. We have been using 3 layer compression with silver collagen small wound. Dimensions are improved. I gave him doxycycline last week which he tolerated because of surrounding erythema. This is improved also 6/17; arrives in clinic today with portable silver collagen dressing tightly adherent to the wound. Also felt that the wrap was too tight 3 layer being changed at the [facility] 6/24; patient's wound is small still adherent debris over the surface however even under illumination it was hard to see what was still open here. We have been using silver collagen 7/1; the patient arrives in the clinic today and the area on the right lower leg is healed. He has severe chronic venous insufficiency. He has 20/30 compression stockings. Readmission: 06/22/2020 on evaluation today patient presents for reevaluation here in our clinic although it has been a little over a year since we last saw him. He does have a history of lymphedema, venous insufficiency, anticoagulant therapy, he is on a pacemaker which is the reason for the anticoagulants, hypertension, and congestive heart failure. With that being said unfortunately he has been dealing with a wound of the left and right legs which has been giving him some trouble since arrival first 2021. That is  in regard to the left leg ulcer. In  regard to the right leg ulcer this is just a very small area that really I think will seal up quite nicely is more of the lymphedema opening than anything else. With that being said unfortunately the left leg is quite significant. We actually have noted that the patient's been dealing with this for quite some time and I think that there is a chance we may want to consider biopsy. With that being said he is on Coumadin therefore we were not able to perform a biopsy at this point. I think that something however in the future that may be warranted we may just have to take him off of the current Coumadin regimen in order to do this. We discussed doing it today but I am more concerned about the fact that he could have issues with uncontrolled bleeding and in that case we may have to send him to the ER. His daughter was not wanting to take that chance which I can completely understand. Nonetheless so far they have been using antibiotic ointment over the area I am just putting a protective dressing I do believe the patient is that he needs some type of compression. 07/07/2020 on evaluation today patient appears to be doing poorly in regard to his bilateral lower extremities today. He actually went to urgent care last night due to having pain in his legs due to the wraps. It appears they got somewhat tight on him because of the fact that he is having such significant swelling. He appears to be fluid overloaded I do not see any signs of infection actively at this point which is good news but unfortunately I think that this is something that may need to be addressed even by cardiology more so than just with a different or alternate type of wrap. I will contact his cardiologist, Dr. Mariah Milling, as well. 07/10/2020 upon evaluation today patient unfortunately appears to be doing worse even than when I saw him on Friday from the standpoint of where his legs stand. The main issue is that he has increased erythema although  there is still not warm to touch I am concerned about infection and about this getting significantly worse. Again my initial concern was more fluid overload and I discussed this with his cardiology office on Friday. With that being said I feel like this has gotten worse his daughter is in agreement and I think that the best option would be to send him to the hospital at this point. READMISSION 09/06/2020 Is a patient who has had 2 stays in this clinic firstly in 2020 and more recently from 06/02/2020 through 07/10/2020. He was cared for by [facility]. When he was last here he had bilateral lower extremity ulcers in the setting of chronic venous insufficiency with lymphedema. When he we last saw him he had weeping edema fluido Cellulitis and he was sent to the hospital. He was admitted initially from 06/30/2020 through 07/14/2020 with cellulitis plus stasis dermatitis. Treated empirically with vancomycin and had increases in his torsemide. He was then sent to the skilled level of [facility] but readmitted to hospital from 07/29/2020 through 08/10/2020 with blood loss anemia hemoglobin of 4 and OB positive stools. During this hospitalization he was noted to have a sacral wound at stage II. He was sent to [facility] of [place] again in the skilled level. He had compression on his legs with Coban. He is now back at his independent living setting.  He has not been wearing compression and his daughter feels there is already increasing swelling. To have stockings at home not exactly sure at what strength. [name], [name] ([id number]) Past medical history includes lymphedema, chronic venous insufficiency, pacemaker, congestive heart failure, hypertension, aortic valve stenosis status post mechanical valve replacement in 2002 on chronic Coumadin, chronic kidney disease stage IIIb, atrial fibrillation, peripheral neuropathy, large hiatal hernia. We did not test his ABIs in the clinic  today. Readmission: 10/17/2020 on evaluation today patient appears to be doing somewhat poorly in regard to his left leg and the fact that he does have an open wound at this point. He has not had the compression socks placed at the facility they have not been putting them on at all unfortunately. He does not have on the right leg currently and apparently there is significant put him on the left leg with the wound which I understand is that can be somewhat tight obviously. Nonetheless I do believe the right leg needs to have a compression sock and I do believe on the left side right now we can do something a little better in order to try to get the wound healed at this point. The patient is on torsemide and currently allopurinol for gout. He also has daily weights due to congestive heart failure I am assuming in order to monitor his fluid status overall. He is currently a DNR at the facility. 10/30/2020 upon evaluation today patient appears to be doing somewhat poorly at this time in regard to his bilateral lower extremities. His left leg which is the only place where he had a wound last week when I saw him is actually doing worse with new wound openings. The right leg also has a small wound as well. However the most concerning thing is that last week he did not have any signs of infection or erythema there is no warmth to touch over the leg. This week the entire leg is more sore he also has erythema extending from the ankle to just below the knee with the area being erythematous and warm to touch which I believe represents cellulitis at minimum. I am concerned about how this is spread so quickly and the fact that he is having increased pain and also not feeling as well. All in all I discussed with the patient that unfortunately I feel like he may be best served going to the ER for further evaluation of this issue currently to try to see about the potential for IV antibiotics and to have  appropriate lab work performed. His daughter is present during the visit today and she was included in the decision making at this point. 11/13/2020 upon evaluation today patient appears to be doing well with regard to his leg ulcer. Fortunately he does not appear to be showing signs of infection anything like it was previous. This is great news. He was sent home with IV antibiotic therapy that seems to have done extremely well for him. No fevers, chills, nausea, vomiting, or diarrhea. 11/27/2020 upon evaluation today patient appears to be doing quite well in regard to his wounds currently. Fortunately there is no signs of active infection at this time which is great news and I am very pleased in this regard. He did have some concerns or his daughter did about an area on his gluteal region. With that being said I see nothing that is actually open at this point which is great news I do think continuing with a derma  cloud is a good way to go. With regard to his leg he does have a new area on the right leg of open wound locations which is new compared to last visit we will need to address this as well. Otherwise he seems to be doing quite well in my opinion. 12/15/2020 upon evaluation today patient appears to be doing well with regard to his leg ulcer on the left. With that being said he did not have the Tubigrip on which I think is integral to getting this closed as he continues to weep. Subsequently I think the Tubigrip will help control some of the edema which will help him in that regard. Fortunately there is no signs of active infection at this time. No fevers, chills, nausea, vomiting, or diarrhea. 01/01/2021 upon evaluation today patient appears to be doing well with regard to his wound. This is measuring better and actually is significantly smaller. Fortunately there is no signs of active infection at this time. No fevers, chills, nausea, vomiting, or diarrhea. 01/15/2021 on evaluation today patient  appears to be doing well with regard to his left leg ulcer though he still continues to weep quite a bit he did not have the Tubigrip on the day that he supposed to. Fortunately there is no signs of active infection at this time. No fevers, chills, nausea, vomiting, or diarrhea. 01/29/2021 upon evaluation today patient appears to be doing well with regard to his right leg which is completely healed. His left leg though not healed is just a very small open area that is weeping. With regard to his gluteal region this is actually new and in fact he has a wound that is really in the sacral area. This is stated to be a stage II that may be the case there is really no slough buildup I Ernie Hew leave it as such for the time being. We will keep a close eye on things however and see how it proceeds. 02/12/2021 upon evaluation today patient appears to be doing excellent in regard to his leg ulcers. In fact everything appears to be closed as best I can tell at this point. I think he is ready to transition into his compression stocking. With that being said his gluteal region still is open although it appears to be fairly clean I think the collagen is still a good thing to do here. We will see how things continue to improve over the next week or so. 02/26/2021 on evaluation today patient appears to be doing well with regard to his wound. He is tolerating the dressing changes without complication the wound is smaller though I feel like he is at the point where we need to try to do something to dry this up a little bit more I think switching from silver collagen to a silver alginate would be beneficial. 03/16/2021 upon evaluation today patient appears to be doing excellent in regard to his wound. He has been tolerating the dressing changes without complication. Fortunately there is no signs of active infection at this time. No fevers, chills, nausea, vomiting, or diarrhea. 04/02/2021 upon evaluation today patient appears to  be doing well with regard to his wound. He really does not have anything terribly open but nonetheless he still has a lot of redness in the sacral area I think there is still a lot of pressure getting to this region. With that being said I think he really needs to have more appropriate offloading I think that may be the biggest issue  he spends a lot of time sitting in his recliner from what I am hearing. 5/23; there has been some improvement in the sacral area not so much in the area on the left buttock. Both of these very tiny punched-out areas with surrounding erythema. They seem to be tender out of proportion to what you might expect looking at them. We have been using silver alginate on this area 04/30/2021 upon evaluation today patient appears to be doing a little bit more poorly in regard to the wounds in the gluteal region and sacral region. Fortunately there does not appear to be any signs of active infection which is great news but I do feel like there is some additional pressure injury here. I did discuss with the patient again today trying to keep pressure off the area I think this is still of utmost importance. Fortunately there is no signs of active infection systemically which is great news. 05/14/2021 upon evaluation today patient appears to be doing about the same in regard to his ulcer in the sacral region. Fortunately there does [name], [name] ([id number]) not appear to be any signs of active infection which is great news and overall very pleased in that regard. With that being said he does have some bleeding when cleansed with saline and gauze over the central portion of the wound I think this is indicative of the better wound bed which is at least good news. 06/01/2021 upon evaluation today patient's wounds actually appear to be doing better one is healed the central is still open but seems to be doing excellent. There is minimal slough noted. 06/11/2021 upon evaluation today  patient appears to be doing about the same in regard to his wound in the sacral region. Fortunately there is no signs of infection currently which is great news. With that being said I do believe that the patient is having some issues here with pain but again I do not feel like it is any worse than previous. Fortunately there is no signs of active infection at this time. Electronic Signature(s) Signed: 06/11/2021 1:01:44 PM By: Lenda Kelp PA-C Entered By: Lenda Kelp on 06/11/2021 13:01:43 [name] ([id number]) -------------------------------------------------------------------------------- Physical Exam Details Patient Name: [name] Hilts A. Date of Service: 06/11/2021 11:15 AM Medical Record Number: [id number] Patient Account Number: [id number] Date of Birth/Sex: July 06, 1931 (87 y.o. M) Treating RN: Hansel Feinstein Primary Care Provider: [name] Other Clinician: Referring Provider: [name] Treating Provider/Extender: Rowan Blase in Treatment: 12 Constitutional Well-nourished and well-hydrated in no acute distress. Respiratory normal breathing without difficulty. Psychiatric this patient is able to make decisions and demonstrates good insight into disease process. Alert and Oriented x 3. pleasant and cooperative. Notes Upon inspection patient's wound bed actually showed signs of fairly good granulation epithelization at this point. There does not appear to be any evidence of active infection which is great and overall I am very pleased with where things stand today. No fevers, chills, nausea, vomiting, or diarrhea. Electronic Signature(s) Signed: 06/11/2021 1:02:02 PM By: Lenda Kelp PA-C Entered By: Lenda Kelp on 06/11/2021 13:02:02 [name] ([id number]) -------------------------------------------------------------------------------- Physician Orders Details Patient Name: [name] Hilts A. Date of Service: 06/11/2021 11:15 AM Medical  Record Number: [id number] Patient Account Number: [id number] Date of Birth/Sex: [birth year] (87 y.o. M) Treating RN: Hansel Feinstein Primary Care Provider: [name] Other Clinician: Referring Provider: [name] Treating Provider/Extender: Rowan Blase in Treatment: 76 Verbal / Phone Orders: No Diagnosis Coding ICD-10 Coding Code  Description L89.152 Pressure ulcer of sacral region, stage 2 Z79.01 Long term (current) use of anticoagulants Z95.0 Presence of cardiac pacemaker I10 Essential (primary) hypertension I50.42 Chronic combined systolic (congestive) and diastolic (congestive) heart failure I25.10 Atherosclerotic heart disease of native coronary artery without angina pectoris Follow-up Appointments o Return Appointment in 2 weeks. Edema Control - Lymphedema / Segmental Compressive Device / Other Bilateral Lower Extremities o Patient to wear own compression stockings. Remove compression stockings every night before going to bed and put on every morning when getting up. - bilat lower leg o Elevate legs to the level of the heart and pump ankles as often as possible o Elevate leg(s) parallel to the floor when sitting. Off-Loading o Gel wheelchair cushion - keep gel cushion under him when out of bed at all times o Low air-loss mattress (Group 2) - needs an air mattress to offload pressure off of wounds o Turn and reposition every 2 hours - keep off of sacral wound area/REPOSITION EVERY 2 HOURS FOR WOUND HEALIN Additional Orders / Instructions Wound #12 Sacrum o Other: - KEEP DRESSINGS ON BOTH SACRAL WOUNDS/FOLLOW WOUND CARE ORDERS Wound Treatment Wound #12 - Sacrum Cleanser: Normal Saline (Generic) 3 x Per Week/30 Days Discharge Instructions: Wash your hands with soap and water. Remove old dressing, discard into plastic bag and place into trash. Cleanse the wound with Normal Saline prior to applying a clean dressing using gauze sponges, not tissues or  cotton balls. Do not scrub or use excessive force. Pat dry using gauze sponges, not tissue or cotton balls. Primary Dressing: Hydrofera Blue Ready Transfer Foam, 2.5x2.5 (in/in) 3 x Per Week/30 Days Discharge Instructions: Apply Hydrofera Blue Ready to wound bed as directed Secondary Dressing: Mepilex Border Flex, 4x4 (in/in) (Generic) 3 x Per Week/30 Days Discharge Instructions: Apply to wound as directed. Do not cut. Electronic Signature(s) Signed: 06/11/2021 2:49:51 PM By: Hansel FeinsteinBishop, Joy Signed: 06/11/2021 5:02:09 PM By: Lenda KelpStone III, Taiven Greenley PA-C Entered By: Hansel FeinsteinBishop, Joy on 06/11/2021 11:52:46 [name], [name] ([id number]) -------------------------------------------------------------------------------- Problem List Details Patient Name: [name], [name] Date of Service: 06/11/2021 11:15 AM Medical Record Number: [id number] Patient Account Number: [id number] Date of Birth/Sex: 07-Oct-1931 (87 y.o. M) Treating RN: Hansel FeinsteinBishop, Joy Primary Care Provider: Marcelino DusterJohnston, John Other Clinician: Referring Provider: Marcelino DusterJohnston, John Treating Provider/Extender: Rowan BlaseStone, Dontea Corlew Weeks in Treatment: 33 Active Problems ICD-10 Encounter Code Description Active Date MDM Diagnosis L89.152 Pressure ulcer of sacral region, stage 2 01/29/2021 No Yes Z79.01 Long term (current) use of anticoagulants 10/17/2020 No Yes Z95.0 Presence of cardiac pacemaker 10/17/2020 No Yes I10 Essential (primary) hypertension 10/17/2020 No Yes I50.42 Chronic combined systolic (congestive) and diastolic (congestive) heart 10/17/2020 No Yes failure I25.10 Atherosclerotic heart disease of native coronary artery without angina 10/17/2020 No Yes pectoris Inactive Problems ICD-10 Code Description Active Date Inactive Date I89.0 Lymphedema, not elsewhere classified 10/17/2020 10/17/2020 I87.2 Venous insufficiency (chronic) (peripheral) 10/17/2020 10/17/2020 L97.822 Non-pressure chronic ulcer of other part of left lower leg with fat layer exposed  10/17/2020 10/17/2020 L97.812 Non-pressure chronic ulcer of other part of right lower leg with fat layer 10/17/2020 10/17/2020 exposed Resolved Problems Electronic Signature(s) Signed: 06/11/2021 11:27:32 AM By: Geni BersStone III, Brennyn Ortlieb PA-C [name], [name] ([id number]) Entered By: Lenda KelpStone III, Marriana Hibberd on 06/11/2021 11:27:32 [name], Oshae A. ([id number]) -------------------------------------------------------------------------------- Progress Note Details Patient Name: [name], [name] Date of Service: 06/11/2021 11:15 AM Medical Record Number: [id number] Patient Account Number: [id number] Date of Birth/Sex: 07-Oct-1931 (87 y.o. M) Treating RN: Hansel FeinsteinBishop, Joy Primary Care Provider: Marcelino DusterJohnston, John Other Clinician: Referring Provider:  [name] Treating Provider/Extender: Allen Derry Weeks in Treatment: 33 Subjective Chief Complaint Information obtained from Patient Sacral and Left Gluteal Ulcers History of Present Illness (HPI) 04/21/2019 ADMISSION This is an independent 87 year old man who lives in the independent part of [address] of [place]. He has a history of chronic lower extremity edema and wears compression stockings. He states about a month ago he was taking off the one on the right and pulled some skin off accidentally. He has had 2 small wounds on the right anterior and right medial lower extremity. I note looking through [facility] health link that he had multiple venous ultrasounds in 2015 and 16. These were DVT rule outs. He did have a Baker's cyst on the left. He has not not had a previous wound history although he did have a history of cellulitis in his legs. Past medical history; includes aortic stenosis status post mechanical AVR in 2007 on chronic Coumadin, lower extremity edema with a history of cellulitis, history of MRSA. ABIs in our clinic were 1.1 on the right 6/3; patient with predominantly venous insufficiency ulcers in the right lower leg probably some degree of lymphedema. He  had to wounds last week. We put him in 3 layer compression. The nurses at [facility] are changing the dressing. The area laterally is healed but he still has a small very painful area on the anterior tibial area. He is on Coumadin because of mechanical aortic valve. 6/10; venous insufficiency ulcers on the right lower leg. He has some degree of lymphedema. We have been using 3 layer compression with silver collagen small wound. Dimensions are improved. I gave him doxycycline last week which he tolerated because of surrounding erythema. This is improved also 6/17; arrives in clinic today with portable silver collagen dressing tightly adherent to the wound. Also felt that the wrap was too tight 3 layer being changed at the [facility] 6/24; patient's wound is small still adherent debris over the surface however even under illumination it was hard to see what was still open here. We have been using silver collagen 7/1; the patient arrives in the clinic today and the area on the right lower leg is healed. He has severe chronic venous insufficiency. He has 20/30 compression stockings. Readmission: 06/22/2020 on evaluation today patient presents for reevaluation here in our clinic although it has been a little over a year since we last saw him. He does have a history of lymphedema, venous insufficiency, anticoagulant therapy, he is on a pacemaker which is the reason for the anticoagulants, hypertension, and congestive heart failure. With that being said unfortunately he has been dealing with a wound of the left and right legs which has been giving him some trouble since arrival first 2021. That is in regard to the left leg ulcer. In regard to the right leg ulcer this is just a very small area that really I think will seal up quite nicely is more of the lymphedema opening than anything else. With that being said unfortunately the left leg is quite significant. We actually have noted that  the patient's been dealing with this for quite some time and I think that there is a chance we may want to consider biopsy. With that being said he is on Coumadin therefore we were not able to perform a biopsy at this point. I think that something however in the future that may be warranted we may just have to take him off of the current Coumadin regimen in  order to do this. We discussed doing it today but I am more concerned about the fact that he could have issues with uncontrolled bleeding and in that case we may have to send him to the ER. His daughter was not wanting to take that chance which I can completely understand. Nonetheless so far they have been using antibiotic ointment over the area I am just putting a protective dressing I do believe the patient is that he needs some type of compression. 07/07/2020 on evaluation today patient appears to be doing poorly in regard to his bilateral lower extremities today. He actually went to urgent care last night due to having pain in his legs due to the wraps. It appears they got somewhat tight on him because of the fact that he is having such significant swelling. He appears to be fluid overloaded I do not see any signs of infection actively at this point which is good news but unfortunately I think that this is something that may need to be addressed even by cardiology more so than just with a different or alternate type of wrap. I will contact his cardiologist, Dr. Mariah Milling, as well. 07/10/2020 upon evaluation today patient unfortunately appears to be doing worse even than when I saw him on Friday from the standpoint of where his legs stand. The main issue is that he has increased erythema although there is still not warm to touch I am concerned about infection and about this getting significantly worse. Again my initial concern was more fluid overload and I discussed this with his cardiology office on Friday. With that being said I feel like this has  gotten worse his daughter is in agreement and I think that the best option would be to send him to the hospital at this point. READMISSION 09/06/2020 Is a patient who has had 2 stays in this clinic firstly in 2020 and more recently from 06/02/2020 through 07/10/2020. He was cared for by [facility]. When he was last here he had bilateral lower extremity ulcers in the setting of chronic venous insufficiency with lymphedema. When he we last saw him he had weeping edema fluido Cellulitis and he was sent to the hospital. He was admitted initially from 06/30/2020 through 07/14/2020 [facility], Mickell A. ([id number]) with cellulitis plus stasis dermatitis. Treated empirically with vancomycin and had increases in his torsemide. He was then sent to the skilled level of [facility] but readmitted to hospital from 07/29/2020 through 08/10/2020 with blood loss anemia hemoglobin of 4 and OB positive stools. During this hospitalization he was noted to have a sacral wound at stage II. He was sent to [facility] [place] again in the skilled level. He had compression on his legs with Coban. He is now back at his independent living setting. He has not been wearing compression and his daughter feels there is already increasing swelling. To have stockings at home not exactly sure at what strength. Past medical history includes lymphedema, chronic venous insufficiency, pacemaker, congestive heart failure, hypertension, aortic valve stenosis status post mechanical valve replacement in 2002 on chronic Coumadin, chronic kidney disease stage IIIb, atrial fibrillation, peripheral neuropathy, large hiatal hernia. We did not test his ABIs in the clinic today. Readmission: 10/17/2020 on evaluation today patient appears to be doing somewhat poorly in regard to his left leg and the fact that he does have an open wound at this point. He has not had the compression socks placed at the facility they have not been putting them  on at  all unfortunately. He does not have on the right leg currently and apparently there is significant put him on the left leg with the wound which I understand is that can be somewhat tight obviously. Nonetheless I do believe the right leg needs to have a compression sock and I do believe on the left side right now we can do something a little better in order to try to get the wound healed at this point. The patient is on torsemide and currently allopurinol for gout. He also has daily weights due to congestive heart failure I am assuming in order to monitor his fluid status overall. He is currently a DNR at the facility. 10/30/2020 upon evaluation today patient appears to be doing somewhat poorly at this time in regard to his bilateral lower extremities. His left leg which is the only place where he had a wound last week when I saw him is actually doing worse with new wound openings. The right leg also has a small wound as well. However the most concerning thing is that last week he did not have any signs of infection or erythema there is no warmth to touch over the leg. This week the entire leg is more sore he also has erythema extending from the ankle to just below the knee with the area being erythematous and warm to touch which I believe represents cellulitis at minimum. I am concerned about how this is spread so quickly and the fact that he is having increased pain and also not feeling as well. All in all I discussed with the patient that unfortunately I feel like he may be best served going to the ER for further evaluation of this issue currently to try to see about the potential for IV antibiotics and to have appropriate lab work performed. His daughter is present during the visit today and she was included in the decision making at this point. 11/13/2020 upon evaluation today patient appears to be doing well with regard to his leg ulcer. Fortunately he does not appear to be showing signs of  infection anything like it was previous. This is great news. He was sent home with IV antibiotic therapy that seems to have done extremely well for him. No fevers, chills, nausea, vomiting, or diarrhea. 11/27/2020 upon evaluation today patient appears to be doing quite well in regard to his wounds currently. Fortunately there is no signs of active infection at this time which is great news and I am very pleased in this regard. He did have some concerns or his daughter did about an area on his gluteal region. With that being said I see nothing that is actually open at this point which is great news I do think continuing with a derma cloud is a good way to go. With regard to his leg he does have a new area on the right leg of open wound locations which is new compared to last visit we will need to address this as well. Otherwise he seems to be doing quite well in my opinion. 12/15/2020 upon evaluation today patient appears to be doing well with regard to his leg ulcer on the left. With that being said he did not have the Tubigrip on which I think is integral to getting this closed as he continues to weep. Subsequently I think the Tubigrip will help control some of the edema which will help him in that regard. Fortunately there is no signs of active infection at this time.  No fevers, chills, nausea, vomiting, or diarrhea. 01/01/2021 upon evaluation today patient appears to be doing well with regard to his wound. This is measuring better and actually is significantly smaller. Fortunately there is no signs of active infection at this time. No fevers, chills, nausea, vomiting, or diarrhea. 01/15/2021 on evaluation today patient appears to be doing well with regard to his left leg ulcer though he still continues to weep quite a bit he did not have the Tubigrip on the day that he supposed to. Fortunately there is no signs of active infection at this time. No fevers, chills, nausea, vomiting, or diarrhea. 01/29/2021  upon evaluation today patient appears to be doing well with regard to his right leg which is completely healed. His left leg though not healed is just a very small open area that is weeping. With regard to his gluteal region this is actually new and in fact he has a wound that is really in the sacral area. This is stated to be a stage II that may be the case there is really no slough buildup I Ernie Hew leave it as such for the time being. We will keep a close eye on things however and see how it proceeds. 02/12/2021 upon evaluation today patient appears to be doing excellent in regard to his leg ulcers. In fact everything appears to be closed as best I can tell at this point. I think he is ready to transition into his compression stocking. With that being said his gluteal region still is open although it appears to be fairly clean I think the collagen is still a good thing to do here. We will see how things continue to improve over the next week or so. 02/26/2021 on evaluation today patient appears to be doing well with regard to his wound. He is tolerating the dressing changes without complication the wound is smaller though I feel like he is at the point where we need to try to do something to dry this up a little bit more I think switching from silver collagen to a silver alginate would be beneficial. 03/16/2021 upon evaluation today patient appears to be doing excellent in regard to his wound. He has been tolerating the dressing changes without complication. Fortunately there is no signs of active infection at this time. No fevers, chills, nausea, vomiting, or diarrhea. 04/02/2021 upon evaluation today patient appears to be doing well with regard to his wound. He really does not have anything terribly open but nonetheless he still has a lot of redness in the sacral area I think there is still a lot of pressure getting to this region. With that being said I think he really needs to have more appropriate  offloading I think that may be the biggest issue he spends a lot of time sitting in his recliner from what I am hearing. 5/23; there has been some improvement in the sacral area not so much in the area on the left buttock. Both of these very tiny punched-out areas with surrounding erythema. They seem to be tender out of proportion to what you might expect looking at them. We have been using silver alginate on this area 04/30/2021 upon evaluation today patient appears to be doing a little bit more poorly in regard to the wounds in the gluteal region and sacral [name], [name] ([id number]) region. Fortunately there does not appear to be any signs of active infection which is great news but I do feel like there is some additional pressure  injury here. I did discuss with the patient again today trying to keep pressure off the area I think this is still of utmost importance. Fortunately there is no signs of active infection systemically which is great news. 05/14/2021 upon evaluation today patient appears to be doing about the same in regard to his ulcer in the sacral region. Fortunately there does not appear to be any signs of active infection which is great news and overall very pleased in that regard. With that being said he does have some bleeding when cleansed with saline and gauze over the central portion of the wound I think this is indicative of the better wound bed which is at least good news. 06/01/2021 upon evaluation today patient's wounds actually appear to be doing better one is healed the central is still open but seems to be doing excellent. There is minimal slough noted. 06/11/2021 upon evaluation today patient appears to be doing about the same in regard to his wound in the sacral region. Fortunately there is no signs of infection currently which is great news. With that being said I do believe that the patient is having some issues here with pain but again I do not feel like it is any  worse than previous. Fortunately there is no signs of active infection at this time. Objective Constitutional Well-nourished and well-hydrated in no acute distress. Vitals Time Taken: 11:16 AM, Temperature: 97.6 F, Pulse: 73 bpm, Respiratory Rate: 16 breaths/min, Blood Pressure: 122/74 mmHg. Respiratory normal breathing without difficulty. Psychiatric this patient is able to make decisions and demonstrates good insight into disease process. Alert and Oriented x 3. pleasant and cooperative. General Notes: Upon inspection patient's wound bed actually showed signs of fairly good granulation epithelization at this point. There does not appear to be any evidence of active infection which is great and overall I am very pleased with where things stand today. No fevers, chills, nausea, vomiting, or diarrhea. Integumentary (Hair, Skin) Wound #12 status is Open. Original cause of wound was Pressure Injury. The date acquired was: 01/18/2021. The wound has been in treatment 19 weeks. The wound is located on the Sacrum. The wound measures 0.5cm length x 0.3cm width x 0.1cm depth; 0.118cm^2 area and 0.012cm^3 volume. There is Fat Layer (Subcutaneous Tissue) exposed. There is no tunneling or undermining noted. There is a small amount of serous drainage noted. The wound margin is flat and intact. There is medium (34-66%) red, pink granulation within the wound bed. There is a medium (34-66%) amount of necrotic tissue within the wound bed including Adherent Slough. 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 Edema Control - Lymphedema / Segmental  Compressive Device / Other: [name], [name] ([id number]) Patient to wear own compression stockings. Remove compression stockings every night before going to bed and put on every morning when getting up. - bilat lower leg Elevate legs to the level of the heart and pump ankles as often as possible Elevate leg(s) parallel to the floor when sitting. Off-Loading: Gel wheelchair cushion - keep gel cushion under him when out of bed at all times Low air-loss mattress (Group 2) - needs an air mattress to offload pressure off of wounds Turn and reposition every 2 hours - keep off of sacral  wound area/REPOSITION EVERY 2 HOURS FOR WOUND HEALIN Additional Orders / Instructions: Wound #12 Sacrum: Other: - KEEP DRESSINGS ON BOTH SACRAL WOUNDS/FOLLOW WOUND CARE ORDERS WOUND #12: - Sacrum Wound Laterality: Cleanser: Normal Saline (Generic) 3 x Per Week/30 Days Discharge Instructions: Wash your hands with soap and water. Remove old dressing, discard into plastic bag and place into trash. Cleanse the wound with Normal Saline prior to applying a clean dressing using gauze sponges, not tissues or cotton balls. Do not scrub or use excessive force. Pat dry using gauze sponges, not tissue or cotton balls. Primary Dressing: Hydrofera Blue Ready Transfer Foam, 2.5x2.5 (in/in) 3 x Per Week/30 Days Discharge Instructions: Apply Hydrofera Blue Ready to wound bed as directed Secondary Dressing: Mepilex Border Flex, 4x4 (in/in) (Generic) 3 x Per Week/30 Days Discharge Instructions: Apply to wound as directed. Do not cut. 1. Would recommend currently that we going continue with the [facility] I think that still the best way to go. 2. I am also can recommend that we have the patient continue with the border foam dressing to cover which I think is also still the optimal thing here. 3. I would also recommend that he needs to try to stay off of this is much as possible. I discussed that with him again today although I am  not sure that he is really doing much to stay off of the area I think that is of utmost importance if he is going overall she sees signs of improvement. Patient's daughter understands. She is going to try to encourage him as well. We will see patient back for reevaluation in 1 week here in the clinic. If anything worsens or changes patient will contact our office for additional recommendations. Electronic Signature(s) Signed: 06/11/2021 1:02:45 PM By: Lenda Kelp PA-C Entered By: Lenda Kelp on 06/11/2021 13:02:45 [name] Hilts A. ([id number]) -------------------------------------------------------------------------------- SuperBill Details Patient Name: [name] Hilts A. Date of Service: 06/11/2021 Medical Record Number: [id number] Patient Account Number: [id number] Date of Birth/Sex: October 11, 1931 (87 y.o. M) Treating RN: Hansel Feinstein Primary Care Provider: [name] Other Clinician: Referring Provider: [name] Treating Provider/Extender: Rowan Blase in Treatment: 33 Diagnosis Coding ICD-10 Codes Code Description [phone] Pressure ulcer of sacral region, stage 2 Z79.01 Long term (current) use of anticoagulants Z95.0 Presence of cardiac pacemaker I10 Essential (primary) hypertension I50.42 Chronic combined systolic (congestive) and diastolic (congestive) heart failure I25.10 Atherosclerotic heart disease of native coronary artery without angina pectoris Facility Procedures CPT4 Code: [id number] Description: [phone] - WOUND CARE VISIT-LEV 2 EST PT Modifier: Quantity: 1 Physician Procedures CPT4 Code: [id number] Description: 99213 - WC PHYS LEVEL 3 - EST PT Modifier: Quantity: 1 CPT4 Code: Description: ICD-10 Diagnosis Description L89.152 Pressure ulcer of sacral region, stage 2 Z79.01 Long term (current) use of anticoagulants Z95.0 Presence of cardiac pacemaker I10 Essential (primary) hypertension Modifier: Quantity: Electronic Signature(s) Signed: 06/11/2021  1:02:59 PM By: Lenda Kelp PA-C Entered By: Lenda Kelp on 06/11/2021 13:02:59

## 2021-06-25 ENCOUNTER — Other Ambulatory Visit: Payer: Self-pay

## 2021-06-25 ENCOUNTER — Encounter: Payer: Medicare Other | Attending: Physician Assistant | Admitting: Physician Assistant

## 2021-06-25 DIAGNOSIS — Z8614 Personal history of Methicillin resistant Staphylococcus aureus infection: Secondary | ICD-10-CM | POA: Insufficient documentation

## 2021-06-25 DIAGNOSIS — I2510 Atherosclerotic heart disease of native coronary artery without angina pectoris: Secondary | ICD-10-CM | POA: Insufficient documentation

## 2021-06-25 DIAGNOSIS — I5042 Chronic combined systolic (congestive) and diastolic (congestive) heart failure: Secondary | ICD-10-CM | POA: Insufficient documentation

## 2021-06-25 DIAGNOSIS — Z7901 Long term (current) use of anticoagulants: Secondary | ICD-10-CM | POA: Diagnosis not present

## 2021-06-25 DIAGNOSIS — Z950 Presence of cardiac pacemaker: Secondary | ICD-10-CM | POA: Diagnosis not present

## 2021-06-25 DIAGNOSIS — G4730 Sleep apnea, unspecified: Secondary | ICD-10-CM | POA: Diagnosis not present

## 2021-06-25 DIAGNOSIS — I350 Nonrheumatic aortic (valve) stenosis: Secondary | ICD-10-CM | POA: Insufficient documentation

## 2021-06-25 DIAGNOSIS — Z952 Presence of prosthetic heart valve: Secondary | ICD-10-CM | POA: Insufficient documentation

## 2021-06-25 DIAGNOSIS — I890 Lymphedema, not elsewhere classified: Secondary | ICD-10-CM | POA: Insufficient documentation

## 2021-06-25 DIAGNOSIS — L89152 Pressure ulcer of sacral region, stage 2: Secondary | ICD-10-CM | POA: Diagnosis present

## 2021-06-25 DIAGNOSIS — I872 Venous insufficiency (chronic) (peripheral): Secondary | ICD-10-CM | POA: Diagnosis not present

## 2021-06-25 DIAGNOSIS — I130 Hypertensive heart and chronic kidney disease with heart failure and stage 1 through stage 4 chronic kidney disease, or unspecified chronic kidney disease: Secondary | ICD-10-CM | POA: Insufficient documentation

## 2021-06-25 DIAGNOSIS — N1832 Chronic kidney disease, stage 3b: Secondary | ICD-10-CM | POA: Insufficient documentation

## 2021-06-25 DIAGNOSIS — G629 Polyneuropathy, unspecified: Secondary | ICD-10-CM | POA: Diagnosis not present

## 2021-06-25 DIAGNOSIS — Z66 Do not resuscitate: Secondary | ICD-10-CM | POA: Insufficient documentation

## 2021-06-25 NOTE — Progress Notes (Signed)
VIREN, LEBEAU ([id number]) Visit Report for 06/25/2021 Arrival Information Details Patient Name: [name], [name] Date of Service: 06/25/2021 3:00 PM Medical Record Number: [id number] Patient Account Number: [id number] Date of Birth/Sex: 1931/04/07 (87 y.o. M) Treating RN: Hansel Feinstein Primary Care Krystl Wickware: [name] Other Clinician: Referring Ariaunna Longsworth: [name] Treating Tacari Repass/Extender: Rowan Blase in Treatment: 35 Visit Information History Since Last Visit Added or deleted any medications: No Patient Arrived: Wheel Chair Had a fall or experienced change in No Arrival Time: 15:10 activities of daily living that may affect Accompanied By: daughter risk of falls: Transfer Assistance: EasyPivot Patient Hospitalized since last visit: No Lift Has Dressing in Place as Prescribed: Yes Patient Identification Verified: Yes Pain Present Now: Yes Secondary Verification Process Completed: Yes Patient Requires Transmission-Based No Precautions: Patient Has Alerts: No Electronic Signature(s) Signed: 06/25/2021 4:53:52 PM By: Hansel Feinstein Entered By: Hansel Feinstein on 06/25/2021 15:13:00 [name] Hilts A. ([id number]) -------------------------------------------------------------------------------- Clinic Level of Care Assessment Details Patient Name: [name] Hilts A. Date of Service: 06/25/2021 3:00 PM Medical Record Number: [id number] Patient Account Number: [id number] Date of Birth/Sex: 04/27/1931 (87 y.o. M) Treating RN: Hansel Feinstein Primary Care Supriya Beaston: [name] Other Clinician: Referring Jazalynn Mireles: [name] Treating Shamara Soza/Extender: Rowan Blase in Treatment: 35 Clinic Level of Care Assessment Items TOOL 4 Quantity Score []  - Use when only an EandM is performed on FOLLOW-UP visit 0 ASSESSMENTS - Nursing Assessment / Reassessment []  - Reassessment of Co-morbidities (includes updates in patient status) 0 []  - 0 Reassessment of Adherence to Treatment  Plan ASSESSMENTS - Wound and Skin Assessment / Reassessment X - Simple Wound Assessment / Reassessment - one wound 1 5 []  - 0 Complex Wound Assessment / Reassessment - multiple wounds []  - 0 Dermatologic / Skin Assessment (not related to wound area) ASSESSMENTS - Focused Assessment []  - Circumferential Edema Measurements - multi extremities 0 []  - 0 Nutritional Assessment / Counseling / Intervention []  - 0 Lower Extremity Assessment (monofilament, tuning fork, pulses) []  - 0 Peripheral Arterial Disease Assessment (using hand held doppler) ASSESSMENTS - Ostomy and/or Continence Assessment and Care []  - Incontinence Assessment and Management 0 []  - 0 Ostomy Care Assessment and Management (repouching, etc.) PROCESS - Coordination of Care X - Simple Patient / Family Education for ongoing care 1 15 []  - 0 Complex (extensive) Patient / Family Education for ongoing care []  - 0 Staff obtains , Records, Test Results / Process Orders X- 1 10 Staff telephones HHA, Nursing Homes / Clarify orders / etc []  - 0 Routine Transfer to another Facility (non-emergent condition) []  - 0 Routine Hospital Admission (non-emergent condition) []  - 0 New Admissions / / Ordering NPWT, Apligraf, etc. []  - 0 Emergency Hospital Admission (emergent condition) X- 1 10 Simple Discharge Coordination []  - 0 Complex (extensive) Discharge Coordination PROCESS - Special Needs []  - Pediatric / Minor Patient Management 0 []  - 0 Isolation Patient Management []  - 0 Hearing / Language / Visual special needs []  - 0 Assessment of Community assistance (transportation, D/C planning, etc.) []  - 0 Additional assistance / Altered mentation []  - 0 Support Surface(s) Assessment (bed, cushion, seat, etc.) INTERVENTIONS - Wound Cleansing / Measurement Dowis, Dejan A. ( ) X- 1 5 Simple Wound Cleansing - one wound []  - 0 Complex Wound Cleansing - multiple wounds []  - 0 Wound  Imaging (photographs - any number of wounds) []  - 0 Wound Tracing (instead of photographs) []  - 0 Simple Wound Measurement - one wound []  - 0 Complex  Wound Measurement - multiple wounds INTERVENTIONS - Wound Dressings X - Small Wound Dressing one or multiple wounds 1 10 []  - 0 Medium Wound Dressing one or multiple wounds []  - 0 Large Wound Dressing one or multiple wounds []  - 0 Application of Medications - topical []  - 0 Application of Medications - injection INTERVENTIONS - Miscellaneous []  - External ear exam 0 []  - 0 Specimen Collection (cultures, biopsies, blood, body fluids, etc.) []  - 0 Specimen(s) / Culture(s) sent or taken to Lab for analysis []  - 0 Patient Transfer (multiple staff / / Similar devices) []  - 0 Simple Staple / Suture removal (25 or less) []  - 0 Complex Staple / Suture removal (26 or more) []  - 0 Hypo / Hyperglycemic Management (close monitor of Blood Glucose) []  - 0 Ankle / Brachial Index (ABI) - do not check if billed separately X- 1 5 Vital Signs Has the patient been seen at the hospital within the last three years: Yes Total Score: 60 Level Of Care: New/Established - Level 2 Electronic Signature(s) Signed: 06/25/2021 4:53:52 PM By: Entered By: on 06/25/2021 15:45:18 A. ( ) -------------------------------------------------------------------------------- Encounter Discharge Information Details Patient Name: A. Date of Service: 06/25/2021 3:00 PM Medical Record Number: Patient Account Number: Date of Birth/Sex: July 28, 1931 (87 y.o. M) Treating RN: 08/25/2021 Primary Care Kaleab Frasier: Hansel Feinstein Other Clinician: Referring Jazzmyn Filion: Hansel Feinstein Treating Ninel Abdella/Extender: 08/25/2021 in Treatment: 35 Encounter Discharge Information Items Discharge Condition: Stable Ambulatory Status: Wheelchair Discharge Destination: Skilled Nursing  Facility Telephoned: No Orders Sent: Yes Transportation: Other Accompanied By: daughter Schedule Follow-up Appointment: Yes Clinical Summary of Care: Electronic Signature(s) Signed: 06/25/2021 4:53:52 PM By: [name] Hilts Entered By: 08/25/2021 on 06/25/2021 15:46:16 Newstrom, Leigh A. ([id number]) -------------------------------------------------------------------------------- Lower Extremity Assessment Details Patient Name: 09/14/1931 A. Date of Service: 06/25/2021 3:00 PM Medical Record Number: Hansel Feinstein Patient Account Number: [name] Date of Birth/Sex: 1931/04/10 (87 y.o. M) Treating RN: 36 Primary Care Takari Duncombe: 08/25/2021 Other Clinician: Referring Elbridge Magowan: Hansel Feinstein Treating Luretta Everly/Extender: Hansel Feinstein Weeks in Treatment: 35 Electronic Signature(s) Signed: 06/25/2021 4:53:52 PM By: [id number] Entered By: [name] Hilts on 06/25/2021 15:20:50 [id number] A. ([id number]) -------------------------------------------------------------------------------- Multi Wound Chart Details Patient Name: 09/14/1931 A. Date of Service: 06/25/2021 3:00 PM Medical Record Number: Hansel Feinstein Patient Account Number: [name] Date of Birth/Sex: 05/23/1931 (87 y.o. M) Treating RN: 08/25/2021 Primary Care Lunell Robart: Hansel Feinstein Other Clinician: Referring Jannessa Ogden: Hansel Feinstein Treating Ramonda Galyon/Extender: 08/25/2021 in Treatment: 35 Vital Signs Height(in): Pulse(bpm): 66 Weight(lbs): Blood Pressure(mmHg): 134/71 Body Mass Index(BMI): Temperature(F): 97.5 Respiratory Rate(breaths/min): 16 Photos: [N/A:N/A] Wound Location: Sacrum N/A N/A Wounding Event: Pressure Injury N/A N/A Primary Etiology: Pressure Ulcer N/A N/A Comorbid History: Cataracts, Sleep Apnea, Congestive N/A N/A Heart Failure, Coronary Artery Disease, Hypertension, Osteoarthritis Date Acquired: 01/18/2021 N/A N/A Weeks of Treatment: 21 N/A N/A Wound Status: Open N/A  N/A Measurements L x W x D (cm) 0.3x0.2x0.1 N/A N/A Area (cm) : 0.047 N/A N/A Volume (cm) : 0.005 N/A N/A % Reduction in Area: 85.80% N/A N/A % Reduction in Volume: 94.90% N/A N/A Classification: Category/Stage II N/A N/A Exudate Amount: Small N/A N/A Exudate Type: Serous N/A N/A Exudate Color: amber N/A N/A Wound Margin: Flat and Intact N/A N/A Granulation Amount: Large (67-100%) N/A N/A Granulation Quality: Pink N/A N/A Necrotic Amount: Small (1-33%) N/A N/A Exposed Structures: Fat Layer (Subcutaneous Tissue): N/A N/A Yes Fascia: No Tendon: No Muscle: No Joint:  No Bone: No Epithelialization: Medium (34-66%) N/A N/A Treatment Notes Electronic Signature(s) Signed: 06/25/2021 4:53:52 PM By: Hansel Feinstein Entered By: Hansel Feinstein on 06/25/2021 15:21:08 [name] Hilts A. ([id number]) -------------------------------------------------------------------------------- Multi-Disciplinary Care Plan Details Patient Name: [name] Hilts A. Date of Service: 06/25/2021 3:00 PM Medical Record Number: [id number] Patient Account Number: [id number] Date of Birth/Sex: Jan 21, 1931 (87 y.o. M) Treating RN: Hansel Feinstein Primary Care Vincenta Steffey: [name] Other Clinician: Referring Constance Hackenberg: [name] Treating Broden Holt/Extender: Allen Derry Weeks in Treatment: 35 Active Inactive Electronic Signature(s) Signed: 06/25/2021 4:53:52 PM By: Hansel Feinstein Entered By: Hansel Feinstein on 06/25/2021 15:20:56 [name] Hilts A. ([id number]) -------------------------------------------------------------------------------- Pain Assessment Details Patient Name: [name] Hilts A. Date of Service: 06/25/2021 3:00 PM Medical Record Number: [id number] Patient Account Number: [id number] Date of Birth/Sex: 04-Jan-1931 (87 y.o. M) Treating RN: Hansel Feinstein Primary Care Samera Macy: [name] Other Clinician: Referring Venson Ferencz: [name] Treating Ciara Kagan/Extender: Rowan Blase in Treatment: 35 Active  Problems Location of Pain Severity and Description of Pain Patient Has Paino Yes Site Locations Rate the pain. Current Pain Level: 3 Pain Management and Medication Current Pain Management: Electronic Signature(s) Signed: 06/25/2021 4:53:52 PM By: Hansel Feinstein Entered By: Hansel Feinstein on 06/25/2021 15:13:56 [name] Hilts A. ([id number]) -------------------------------------------------------------------------------- Patient/Caregiver Education Details Patient Name: [name] Hilts A. Date of Service: 06/25/2021 3:00 PM Medical Record Number: [id number] Patient Account Number: [id number] Date of Birth/Gender: 1931-10-19 (87 y.o. M) Treating RN: Hansel Feinstein Primary Care Physician: [name] Other Clinician: Referring Physician: [name] Treating Physician/Extender: Rowan Blase in Treatment: 35 Education Assessment Education Provided To: Patient and Caregiver Education Topics Provided Basic Hygiene: Nutrition: Offloading: Wound/Skin Impairment: Electronic Signature(s) Signed: 06/25/2021 4:53:52 PM By: Hansel Feinstein Entered By: Hansel Feinstein on 06/25/2021 15:45:39 Lotter, Kyle A. ([id number]) -------------------------------------------------------------------------------- Wound Assessment Details Patient Name: [name] Hilts A. Date of Service: 06/25/2021 3:00 PM Medical Record Number: [id number] Patient Account Number: [id number] Date of Birth/Sex: 30-Jan-1931 (87 y.o. M) Treating RN: Hansel Feinstein Primary Care Eleanna Theilen: [name] Other Clinician: Referring Phaedra Colgate: [name] Treating Harlei Lehrmann/Extender: Allen Derry Weeks in Treatment: 35 Wound Status Wound Number: 12 Primary Pressure Ulcer Etiology: Wound Location: Sacrum Wound Open Wounding Event: Pressure Injury Status: Date Acquired: 01/18/2021 Comorbid Cataracts, Sleep Apnea, Congestive Heart Failure, Weeks Of Treatment: 21 History: Coronary Artery Disease, Hypertension, Osteoarthritis Clustered Wound:  No Photos Wound Measurements Length: (cm) 0.3 Width: (cm) 0.2 Depth: (cm) 0.1 Area: (cm) 0.047 Volume: (cm) 0.005 % Reduction in Area: 85.8% % Reduction in Volume: 94.9% Epithelialization: Medium (34-66%) Tunneling: No Undermining: No Wound Description Classification: Category/Stage II Wound Margin: Flat and Intact Exudate Amount: Small Exudate Type: Serous Exudate Color: amber Foul Odor After Cleansing: No Slough/Fibrino Yes Wound Bed Granulation Amount: Large (67-100%) Exposed Structure Granulation Quality: Pink Fascia Exposed: No Necrotic Amount: Small (1-33%) Fat Layer (Subcutaneous Tissue) Exposed: Yes Necrotic Quality: Adherent Slough Tendon Exposed: No Muscle Exposed: No Joint Exposed: No Bone Exposed: No Treatment Notes Wound #12 (Sacrum) Cleanser Soap and Water Discharge Instruction: Gently cleanse wound with antibacterial soap, rinse and pat dry prior to dressing wounds Peri-Wound Care Ellis, Kysen A. ([id number]) Topical Primary Dressing Hydrofera Blue Ready Transfer Foam, 2.5x2.5 (in/in) Discharge Instruction: Apply Hydrofera Blue Ready to wound bed as directed Secondary Dressing Mepilex Border Flex, 4x4 (in/in) Discharge Instruction: Recommend do not use bandaids-use a foam border for protectionApply to wound as directed. Do not cut. Secured With Compression Wrap Compression Stockings Add-Ons Electronic Signature(s) Signed: 06/25/2021 4:53:52 PM By: Hansel Feinstein Entered By: Hansel Feinstein on 06/25/2021 15:20:35 [name], Oren Bracket  A. ([id number]) -------------------------------------------------------------------------------- Vitals Details Patient Name: [name] HiltsSCHENK, Jahiem A. Date of Service: 06/25/2021 3:00 PM Medical Record Number: [id number] Patient Account Number: [id number] Date of Birth/Sex: 1931-04-15 (87 y.o. M) Treating RN: Hansel FeinsteinBishop, Joy Primary Care Thane Age: Marcelino DusterJohnston, John Other Clinician: Referring Shivaun Bilello: Marcelino DusterJohnston, John Treating  Teyla Skidgel/Extender: Rowan BlaseStone, Hoyt Weeks in Treatment: 35 Vital Signs Time Taken: 15:13 Temperature (F): 97.5 Pulse (bpm): 66 Respiratory Rate (breaths/min): 16 Blood Pressure (mmHg): 134/71 Reference Range: 80 - 120 mg / dl Electronic Signature(s) Signed: 06/25/2021 4:53:52 PM By: Hansel FeinsteinBishop, Joy Entered ByHansel Feinstein: Bishop, Joy on 06/25/2021 15:13:49

## 2021-06-25 NOTE — Progress Notes (Addendum)
PRESCOTT, TRUEX ([id number]) Visit Report for 06/25/2021 Chief Complaint Document Details Patient Name: [name], [name]. Date of Service: 06/25/2021 3:00 PM Medical Record Number: [id number] Patient Account Number: [id number] Date of Birth/Sex: [birth year] (87 y.o. M) Treating RN: Hansel Feinstein Primary Care Provider: [name] Other Clinician: Referring Provider: [name] Treating Provider/Extender: Rowan Blase in Treatment: 35 Information Obtained from: Patient Chief Complaint Sacral and Left Gluteal Ulcers Electronic Signature(s) Signed: 06/25/2021 3:23:41 PM By: Lenda Kelp PA-C Entered By: Lenda Kelp on 06/25/2021 15:23:41 [name]. ([id number]) -------------------------------------------------------------------------------- HPI Details Patient Name: [name]. Date of Service: 06/25/2021 3:00 PM Medical Record Number: [id number] Patient Account Number: [id number] Date of Birth/Sex: 1931-08-26 (87 y.o. M) Treating RN: Hansel Feinstein Primary Care Provider: [name] Other Clinician: Referring Provider: [name] Treating Provider/Extender: Rowan Blase in Treatment: 35 History of Present Illness HPI Description: 04/21/2019 ADMISSION This is an independent 87 year old man who lives in the independent part of [address] of [place]. He has [name] history of chronic lower extremity edema and wears compression stockings. He states about [name] month ago he was taking off the one on the right and pulled some skin off accidentally. He has had 2 small wounds on the right anterior and right medial lower extremity. I note looking through [facility] health link that he had multiple venous ultrasounds in 2015 and 16. These were DVT rule outs. He did have [name] Baker's cyst on the left. He has not not had [name] previous wound history although he did have [name] history of cellulitis in his legs. Past medical history; includes aortic stenosis status post mechanical AVR in 2007 on  chronic Coumadin, lower extremity edema with [name] history of cellulitis, history of MRSA. ABIs in our clinic were 1.1 on the right 6/3; patient with predominantly venous insufficiency ulcers in the right lower leg probably some degree of lymphedema. He had to wounds last week. We put him in 3 layer compression. The nurses at [facility] are changing the dressing. The area laterally is healed but he still has [name] small very painful area on the anterior tibial area. He is on Coumadin because of mechanical aortic valve. 6/10; venous insufficiency ulcers on the right lower leg. He has some degree of lymphedema. We have been using 3 layer compression with silver collagen small wound. Dimensions are improved. I gave him doxycycline last week which he tolerated because of surrounding erythema. This is improved also 6/17; arrives in clinic today with portable silver collagen dressing tightly adherent to the wound. Also felt that the wrap was too tight 3 layer being changed at the [facility] 6/24; patient's wound is small still adherent debris over the surface however even under illumination it was hard to see what was still open here. We have been using silver collagen 7/1; the patient arrives in the clinic today and the area on the right lower leg is healed. He has severe chronic venous insufficiency. He has 20/30 compression stockings. Readmission: 06/22/2020 on evaluation today patient presents for reevaluation here in our clinic although it has been [name] little over [name] year since we last saw him. He does have [name] history of lymphedema, venous insufficiency, anticoagulant therapy, he is on [name] pacemaker which is the reason for the anticoagulants, hypertension, and congestive heart failure. With that being said unfortunately he has been dealing with [name] wound of the left and right legs which has been giving him some trouble since arrival first 2021. That is  in regard to the left leg ulcer. In regard  to the right leg ulcer this is just [name] very small area that really I think will seal up quite nicely is more of the lymphedema opening than anything else. With that being said unfortunately the left leg is quite significant. We actually have noted that the patient's been dealing with this for quite some time and I think that there is [name] chance we may want to consider biopsy. With that being said he is on Coumadin therefore we were not able to perform [name] biopsy at this point. I think that something however in the future that may be warranted we may just have to take him off of the current Coumadin regimen in order to do this. We discussed doing it today but I am more concerned about the fact that he could have issues with uncontrolled bleeding and in that case we may have to send him to the ER. His daughter was not wanting to take that chance which I can completely understand. Nonetheless so far they have been using antibiotic ointment over the area I am just putting [name] protective dressing I do believe the patient is that he needs some type of compression. 07/07/2020 on evaluation today patient appears to be doing poorly in regard to his bilateral lower extremities today. He actually went to urgent care last night due to having pain in his legs due to the wraps. It appears they got somewhat tight on him because of the fact that he is having such significant swelling. He appears to be fluid overloaded I do not see any signs of infection actively at this point which is good news but unfortunately I think that this is something that may need to be addressed even by cardiology more so than just with [name] different or alternate type of wrap. I will contact his cardiologist, Dr. Mariah Milling, as well. 07/10/2020 upon evaluation today patient unfortunately appears to be doing worse even than when I saw him on Friday from the standpoint of where his legs stand. The main issue is that he has increased erythema although there  is still not warm to touch I am concerned about infection and about this getting significantly worse. Again my initial concern was more fluid overload and I discussed this with his cardiology office on Friday. With that being said I feel like this has gotten worse his daughter is in agreement and I think that the best option would be to send him to the hospital at this point. READMISSION 09/06/2020 Is [name] patient who has had 2 stays in this clinic firstly in 2020 and more recently from 06/02/2020 through 07/10/2020. He was cared for by [facility]. When he was last here he had bilateral lower extremity ulcers in the setting of chronic venous insufficiency with lymphedema. When he we last saw him he had weeping edema fluido Cellulitis and he was sent to the hospital. He was admitted initially from 06/30/2020 through 07/14/2020 with cellulitis plus stasis dermatitis. Treated empirically with vancomycin and had increases in his torsemide. He was then sent to the skilled level of [facility] but readmitted to hospital from 07/29/2020 through 08/10/2020 with blood loss anemia hemoglobin of 4 and OB positive stools. During this hospitalization he was noted to have [name] sacral wound at stage II. He was sent to [facility] again in the skilled level. He had compression on his legs with Coban. He is now back at his independent living setting.  He has not been wearing compression and his daughter feels there is already increasing swelling. To have stockings at home not exactly sure at what strength. [name] HiltsSCHENK, Ramadan [name]. ([id number]) Past medical history includes lymphedema, chronic venous insufficiency, pacemaker, congestive heart failure, hypertension, aortic valve stenosis status post mechanical valve replacement in 2002 on chronic Coumadin, chronic kidney disease stage IIIb, atrial fibrillation, peripheral neuropathy, large hiatal hernia. We did not test his ABIs in the clinic  today. Readmission: 10/17/2020 on evaluation today patient appears to be doing somewhat poorly in regard to his left leg and the fact that he does have an open wound at this point. He has not had the compression socks placed at the facility they have not been putting them on at all unfortunately. He does not have on the right leg currently and apparently there is significant put him on the left leg with the wound which I understand is that can be somewhat tight obviously. Nonetheless I do believe the right leg needs to have [name] compression sock and I do believe on the left side right now we can do something [name] little better in order to try to get the wound healed at this point. The patient is on torsemide and currently allopurinol for gout. He also has daily weights due to congestive heart failure I am assuming in order to monitor his fluid status overall. He is currently [name] DNR at the facility. 10/30/2020 upon evaluation today patient appears to be doing somewhat poorly at this time in regard to his bilateral lower extremities. His left leg which is the only place where he had [name] wound last week when I saw him is actually doing worse with new wound openings. The right leg also has [name] small wound as well. However the most concerning thing is that last week he did not have any signs of infection or erythema there is no warmth to touch over the leg. This week the entire leg is more sore he also has erythema extending from the ankle to just below the knee with the area being erythematous and warm to touch which I believe represents cellulitis at minimum. I am concerned about how this is spread so quickly and the fact that he is having increased pain and also not feeling as well. All in all I discussed with the patient that unfortunately I feel like he may be best served going to the ER for further evaluation of this issue currently to try to see about the potential for IV antibiotics and to have  appropriate lab work performed. His daughter is present during the visit today and she was included in the decision making at this point. 11/13/2020 upon evaluation today patient appears to be doing well with regard to his leg ulcer. Fortunately he does not appear to be showing signs of infection anything like it was previous. This is great news. He was sent home with IV antibiotic therapy that seems to have done extremely well for him. No fevers, chills, nausea, vomiting, or diarrhea. 11/27/2020 upon evaluation today patient appears to be doing quite well in regard to his wounds currently. Fortunately there is no signs of active infection at this time which is great news and I am very pleased in this regard. He did have some concerns or his daughter did about an area on his gluteal region. With that being said I see nothing that is actually open at this point which is great news I do think continuing with [name] derma  cloud is [name] good way to go. With regard to his leg he does have [name] new area on the right leg of open wound locations which is new compared to last visit we will need to address this as well. Otherwise he seems to be doing quite well in my opinion. 12/15/2020 upon evaluation today patient appears to be doing well with regard to his leg ulcer on the left. With that being said he did not have the Tubigrip on which I think is integral to getting this closed as he continues to weep. Subsequently I think the Tubigrip will help control some of the edema which will help him in that regard. Fortunately there is no signs of active infection at this time. No fevers, chills, nausea, vomiting, or diarrhea. 01/01/2021 upon evaluation today patient appears to be doing well with regard to his wound. This is measuring better and actually is significantly smaller. Fortunately there is no signs of active infection at this time. No fevers, chills, nausea, vomiting, or diarrhea. 01/15/2021 on evaluation today patient  appears to be doing well with regard to his left leg ulcer though he still continues to weep quite [name] bit he did not have the Tubigrip on the day that he supposed to. Fortunately there is no signs of active infection at this time. No fevers, chills, nausea, vomiting, or diarrhea. 01/29/2021 upon evaluation today patient appears to be doing well with regard to his right leg which is completely healed. His left leg though not healed is just [name] very small open area that is weeping. With regard to his gluteal region this is actually new and in fact he has [name] wound that is really in the sacral area. This is stated to be [name] stage II that may be the case there is really no slough buildup I Ernie Hew leave it as such for the time being. We will keep [name] close eye on things however and see how it proceeds. 02/12/2021 upon evaluation today patient appears to be doing excellent in regard to his leg ulcers. In fact everything appears to be closed as best I can tell at this point. I think he is ready to transition into his compression stocking. With that being said his gluteal region still is open although it appears to be fairly clean I think the collagen is still [name] good thing to do here. We will see how things continue to improve over the next week or so. 02/26/2021 on evaluation today patient appears to be doing well with regard to his wound. He is tolerating the dressing changes without complication the wound is smaller though I feel like he is at the point where we need to try to do something to dry this up [name] little bit more I think switching from silver collagen to [name] silver alginate would be beneficial. 03/16/2021 upon evaluation today patient appears to be doing excellent in regard to his wound. He has been tolerating the dressing changes without complication. Fortunately there is no signs of active infection at this time. No fevers, chills, nausea, vomiting, or diarrhea. 04/02/2021 upon evaluation today patient appears to  be doing well with regard to his wound. He really does not have anything terribly open but nonetheless he still has [name] lot of redness in the sacral area I think there is still [name] lot of pressure getting to this region. With that being said I think he really needs to have more appropriate offloading I think that may be the biggest issue  he spends [name] lot of time sitting in his recliner from what I am hearing. 5/23; there has been some improvement in the sacral area not so much in the area on the left buttock. Both of these very tiny punched-out areas with surrounding erythema. They seem to be tender out of proportion to what you might expect looking at them. We have been using silver alginate on this area 04/30/2021 upon evaluation today patient appears to be doing [name] little bit more poorly in regard to the wounds in the gluteal region and sacral region. Fortunately there does not appear to be any signs of active infection which is great news but I do feel like there is some additional pressure injury here. I did discuss with the patient again today trying to keep pressure off the area I think this is still of utmost importance. Fortunately there is no signs of active infection systemically which is great news. 05/14/2021 upon evaluation today patient appears to be doing about the same in regard to his ulcer in the sacral region. Fortunately there does Christoffersen, Jelan [name]. ([id number]) not appear to be any signs of active infection which is great news and overall very pleased in that regard. With that being said he does have some bleeding when cleansed with saline and gauze over the central portion of the wound I think this is indicative of the better wound bed which is at least good news. 06/01/2021 upon evaluation today patient's wounds actually appear to be doing better one is healed the central is still open but seems to be doing excellent. There is minimal slough noted. 06/11/2021 upon evaluation today  patient appears to be doing about the same in regard to his wound in the sacral region. Fortunately there is no signs of infection currently which is great news. With that being said I do believe that the patient is having some issues here with pain but again I do not feel like it is any worse than previous. Fortunately there is no signs of active infection at this time. 06/25/2021 upon evaluation today patient appears to be doing [name] little bit worse in regard to the periwound although the wound itself is measuring [name] little bit smaller. Fortunately I do not see any signs of infection and I am pleased with the smaller size of the wound. Nonetheless the Band-Aid that was on today is absolutely not the right thing to have on and to be honest I think this is what caused the small area of excoriation/skin tear around the edges of the wound with [name] Band-Aid which is stuck weight too tight. He is post have [name] border foam in place and [facility] he had [name] large Band-Aid and silver alginate which was not even on the wound bed. Electronic Signature(s) Signed: 06/25/2021 3:53:06 PM By: Lenda KelpStone III, Shariah Assad PA-C Entered By: Lenda KelpStone III, Monzerrat Wellen on 06/25/2021 15:53:05 [name], Waverly [name]. ([id number]) -------------------------------------------------------------------------------- Physical Exam Details Patient Name: [name] HiltsSCHENK, Zarin [name]. Date of Service: 06/25/2021 3:00 PM Medical Record Number: [id number] Patient Account Number: [id number] Date of Birth/Sex: 19-Jan-1931 (87 y.o. M) Treating RN: Hansel FeinsteinBishop, Joy Primary Care Provider: Marcelino DusterJohnston, John Other Clinician: Referring Provider: Marcelino DusterJohnston, John Treating Provider/Extender: Rowan BlaseStone, Abad Manard Weeks in Treatment: 35 Constitutional Well-nourished and well-hydrated in no acute distress. Respiratory normal breathing without difficulty. Psychiatric this patient is able to make decisions and demonstrates good insight into disease process. Alert and Oriented x 3. pleasant and  cooperative. Notes Upon inspection patient's wound again showed signs of being smaller and  happy in that regard. He does seem to be staying off of it more which I think is making all the difference in the world. With that being said the [facility] and the bandage that was covering the wound just does not the right thing to have and is actually cause [name] little bit of complication here to be honest. Electronic Signature(s) Signed: 06/25/2021 3:53:27 PM By: Lenda Kelp PA-C Entered By: Lenda Kelp on 06/25/2021 15:53:26 [name]. ([id number]) -------------------------------------------------------------------------------- Physician Orders Details Patient Name: [name]. Date of Service: 06/25/2021 3:00 PM Medical Record Number: [id number] Patient Account Number: [id number] Date of Birth/Sex: 01-16-1931 (87 y.o. M) Treating RN: Hansel Feinstein Primary Care Provider: [name] Other Clinician: Referring Provider: [name] Treating Provider/Extender: Rowan Blase in Treatment: 56 Verbal / Phone Orders: No Diagnosis Coding Follow-up Appointments o Return Appointment in 2 weeks. Edema Control - Lymphedema / Segmental Compressive Device / Other Bilateral Lower Extremities o Patient to wear own compression stockings. Remove compression stockings every night before going to bed and put on every morning when getting up. - bilat lower leg o Elevate legs to the level of the heart and pump ankles as often as possible o Elevate leg(s) parallel to the floor when sitting. Off-Loading o Gel wheelchair cushion - keep gel cushion under him when out of bed at all times o Low air-loss mattress (Group 2) - needs an air mattress to offload pressure off of wounds o Turn and reposition every 2 hours - keep off of sacral wound area/REPOSITION EVERY 2 HOURS FOR WOUND HEALIN Wound Treatment Wound #12 - Sacrum Cleanser: Soap and Water 3 x Per Week/30  Days Discharge Instructions: Gently cleanse wound with antibacterial soap, rinse and pat dry prior to dressing wounds Primary Dressing: Hydrofera Blue Ready Transfer Foam, 2.5x2.5 (in/in) 3 x Per Week/30 Days Discharge Instructions: Apply Hydrofera Blue Ready to wound bed as directed Secondary Dressing: Mepilex Border Flex, 4x4 (in/in) (Generic) 3 x Per Week/30 Days Discharge Instructions: Recommend do not use bandaids-use [name] foam border for protectionApply to wound as directed. Do not cut. Notes Extra pad of Hydrofera blue sent with patient-- Electronic Signature(s) Signed: 06/25/2021 4:30:46 PM By: Lenda Kelp PA-C Signed: 06/25/2021 4:53:52 PM By: Hansel Feinstein Entered By: Hansel Feinstein on 06/25/2021 15:36:04 [name]. ([id number]) -------------------------------------------------------------------------------- Problem List Details Patient Name: [name]. Date of Service: 06/25/2021 3:00 PM Medical Record Number: [id number] Patient Account Number: [id number] Date of Birth/Sex: [birth year] (87 y.o. 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 10/17/2020 No Yes failure I25.10 Atherosclerotic heart disease of native coronary artery without angina 10/17/2020 No Yes pectoris Inactive Problems ICD-10 Code Description Active Date Inactive Date I89.0 Lymphedema, not elsewhere classified 10/17/2020 10/17/2020 I87.2 Venous insufficiency (chronic)  (peripheral) 10/17/2020 10/17/2020 L97.822 Non-pressure chronic ulcer of other part of left lower leg with fat layer exposed 10/17/2020 10/17/2020 L97.812 Non-pressure chronic ulcer of other part of right lower leg with fat layer 10/17/2020 10/17/2020 exposed Resolved Problems Electronic Signature(s) Signed: 06/25/2021 3:23:36 PM By: Geni Bers, Brady [name]. (  [id number]) Entered By: Lenda Kelp on 06/25/2021 15:23:35 [name]. ([id number]) -------------------------------------------------------------------------------- Progress Note Details Patient Name: [name]. Date of Service: 06/25/2021 3:00 PM Medical Record Number: [id number] Patient Account Number: [id number] Date of Birth/Sex: March 02, 1931 (87 y.o. M) Treating RN: Hansel Feinstein Primary Care Provider: [name] Other Clinician: Referring Provider: [name] Treating Provider/Extender: Rowan Blase in Treatment: 35 Subjective Chief Complaint Information obtained from Patient Sacral and Left Gluteal Ulcers History of Present Illness (HPI) 04/21/2019 ADMISSION This is an independent 87 year old man who lives in the independent part of [address] [place]. He has [name] history of chronic lower extremity edema and wears compression stockings. He states about [name] month ago he was taking off the one on the right and pulled some skin off accidentally. He has had 2 small wounds on the right anterior and right medial lower extremity. I note looking through [facility] health link that he had multiple venous ultrasounds in 2015 and 16. These were DVT rule outs. He did have [name] Baker's cyst on the left. He has not not had [name] previous wound history although he did have [name] history of cellulitis in his legs. Past medical history; includes aortic stenosis status post mechanical AVR in 2007 on chronic Coumadin, lower extremity edema with [name] history of cellulitis, history of MRSA. ABIs in our clinic were 1.1 on the  right 6/3; patient with predominantly venous insufficiency ulcers in the right lower leg probably some degree of lymphedema. He had to wounds last week. We put him in 3 layer compression. The nurses at [facility] are changing the dressing. The area laterally is healed but he still has [name] small very painful area on the anterior tibial area. He is on Coumadin because of mechanical aortic valve. 6/10; venous insufficiency ulcers on the right lower leg. He has some degree of lymphedema. We have been using 3 layer compression with silver collagen small wound. Dimensions are improved. I gave him doxycycline last week which he tolerated because of surrounding erythema. This is improved also 6/17; arrives in clinic today with portable silver collagen dressing tightly adherent to the wound. Also felt that the wrap was too tight 3 layer being changed at the [facility] 6/24; patient's wound is small still adherent debris over the surface however even under illumination it was hard to see what was still open here. We have been using silver collagen 7/1; the patient arrives in the clinic today and the area on the right lower leg is healed. He has severe chronic venous insufficiency. He has 20/30 compression stockings. Readmission: 06/22/2020 on evaluation today patient presents for reevaluation here in our clinic although it has been [name] little over [name] year since we last saw him. He does have [name] history of lymphedema, venous insufficiency, anticoagulant therapy, he is on [name] pacemaker which is the reason for the anticoagulants, hypertension, and congestive heart failure. With that being said unfortunately he has been dealing with [name] wound of the left and right legs which has been giving him some trouble since arrival first 2021. That is in regard to the left leg ulcer. In regard to the right leg ulcer this is just [name] very small area that really I think will seal up quite nicely is more of the  lymphedema opening than anything else. With that being said unfortunately the left leg is quite significant. We actually have noted that the patient's been dealing with this for quite some time and I think that  there is [name] chance we may want to consider biopsy. With that being said he is on Coumadin therefore we were not able to perform [name] biopsy at this point. I think that something however in the future that may be warranted we may just have to take him off of the current Coumadin regimen in order to do this. We discussed doing it today but I am more concerned about the fact that he could have issues with uncontrolled bleeding and in that case we may have to send him to the ER. His daughter was not wanting to take that chance which I can completely understand. Nonetheless so far they have been using antibiotic ointment over the area I am just putting [name] protective dressing I do believe the patient is that he needs some type of compression. 07/07/2020 on evaluation today patient appears to be doing poorly in regard to his bilateral lower extremities today. He actually went to urgent care last night due to having pain in his legs due to the wraps. It appears they got somewhat tight on him because of the fact that he is having such significant swelling. He appears to be fluid overloaded I do not see any signs of infection actively at this point which is good news but unfortunately I think that this is something that may need to be addressed even by cardiology more so than just with [name] different or alternate type of wrap. I will contact his cardiologist, Dr. Mariah Milling, as well. 07/10/2020 upon evaluation today patient unfortunately appears to be doing worse even than when I saw him on Friday from the standpoint of where his legs stand. The main issue is that he has increased erythema although there is still not warm to touch I am concerned about infection and about this getting significantly worse. Again my  initial concern was more fluid overload and I discussed this with his cardiology office on Friday. With that being said I feel like this has gotten worse his daughter is in agreement and I think that the best option would be to send him to the hospital at this point. READMISSION 09/06/2020 Is [name] patient who has had 2 stays in this clinic firstly in 2020 and more recently from 06/02/2020 through 07/10/2020. He was cared for by [facility]. When he was last here he had bilateral lower extremity ulcers in the setting of chronic venous insufficiency with lymphedema. When he we last saw him he had weeping edema fluido Cellulitis and he was sent to the hospital. He was admitted initially from 06/30/2020 through 07/14/2020 [facility], Raeqwon [name]. ([id number]) with cellulitis plus stasis dermatitis. Treated empirically with vancomycin and had increases in his torsemide. He was then sent to the skilled level of [facility] but readmitted to hospital from 07/29/2020 through 08/10/2020 with blood loss anemia hemoglobin of 4 and OB positive stools. During this hospitalization he was noted to have [name] sacral wound at stage II. He was sent to [facility] again in the skilled level. He had compression on his legs with Coban. He is now back at his independent living setting. He has not been wearing compression and his daughter feels there is already increasing swelling. To have stockings at home not exactly sure at what strength. Past medical history includes lymphedema, chronic venous insufficiency, pacemaker, congestive heart failure, hypertension, aortic valve stenosis status post mechanical valve replacement in 2002 on chronic Coumadin, chronic kidney disease stage IIIb, atrial fibrillation, peripheral neuropathy, large hiatal hernia. We did  not test his ABIs in the clinic today. Readmission: 10/17/2020 on evaluation today patient appears to be doing somewhat poorly in regard to his left leg and the fact that  he does have an open wound at this point. He has not had the compression socks placed at the facility they have not been putting them on at all unfortunately. He does not have on the right leg currently and apparently there is significant put him on the left leg with the wound which I understand is that can be somewhat tight obviously. Nonetheless I do believe the right leg needs to have [name] compression sock and I do believe on the left side right now we can do something [name] little better in order to try to get the wound healed at this point. The patient is on torsemide and currently allopurinol for gout. He also has daily weights due to congestive heart failure I am assuming in order to monitor his fluid status overall. He is currently [name] DNR at the facility. 10/30/2020 upon evaluation today patient appears to be doing somewhat poorly at this time in regard to his bilateral lower extremities. His left leg which is the only place where he had [name] wound last week when I saw him is actually doing worse with new wound openings. The right leg also has [name] small wound as well. However the most concerning thing is that last week he did not have any signs of infection or erythema there is no warmth to touch over the leg. This week the entire leg is more sore he also has erythema extending from the ankle to just below the knee with the area being erythematous and warm to touch which I believe represents cellulitis at minimum. I am concerned about how this is spread so quickly and the fact that he is having increased pain and also not feeling as well. All in all I discussed with the patient that unfortunately I feel like he may be best served going to the ER for further evaluation of this issue currently to try to see about the potential for IV antibiotics and to have appropriate lab work performed. His daughter is present during the visit today and she was included in the decision making at this point. 11/13/2020 upon  evaluation today patient appears to be doing well with regard to his leg ulcer. Fortunately he does not appear to be showing signs of infection anything like it was previous. This is great news. He was sent home with IV antibiotic therapy that seems to have done extremely well for him. No fevers, chills, nausea, vomiting, or diarrhea. 11/27/2020 upon evaluation today patient appears to be doing quite well in regard to his wounds currently. Fortunately there is no signs of active infection at this time which is great news and I am very pleased in this regard. He did have some concerns or his daughter did about an area on his gluteal region. With that being said I see nothing that is actually open at this point which is great news I do think continuing with [name] derma cloud is [name] good way to go. With regard to his leg he does have [name] new area on the right leg of open wound locations which is new compared to last visit we will need to address this as well. Otherwise he seems to be doing quite well in my opinion. 12/15/2020 upon evaluation today patient appears to be doing well with regard to his leg ulcer on the  left. With that being said he did not have the Tubigrip on which I think is integral to getting this closed as he continues to weep. Subsequently I think the Tubigrip will help control some of the edema which will help him in that regard. Fortunately there is no signs of active infection at this time. No fevers, chills, nausea, vomiting, or diarrhea. 01/01/2021 upon evaluation today patient appears to be doing well with regard to his wound. This is measuring better and actually is significantly smaller. Fortunately there is no signs of active infection at this time. No fevers, chills, nausea, vomiting, or diarrhea. 01/15/2021 on evaluation today patient appears to be doing well with regard to his left leg ulcer though he still continues to weep quite [name] bit he did not have the Tubigrip on the day that he  supposed to. Fortunately there is no signs of active infection at this time. No fevers, chills, nausea, vomiting, or diarrhea. 01/29/2021 upon evaluation today patient appears to be doing well with regard to his right leg which is completely healed. His left leg though not healed is just [name] very small open area that is weeping. With regard to his gluteal region this is actually new and in fact he has [name] wound that is really in the sacral area. This is stated to be [name] stage II that may be the case there is really no slough buildup I Ernie Hew leave it as such for the time being. We will keep [name] close eye on things however and see how it proceeds. 02/12/2021 upon evaluation today patient appears to be doing excellent in regard to his leg ulcers. In fact everything appears to be closed as best I can tell at this point. I think he is ready to transition into his compression stocking. With that being said his gluteal region still is open although it appears to be fairly clean I think the collagen is still [name] good thing to do here. We will see how things continue to improve over the next week or so. 02/26/2021 on evaluation today patient appears to be doing well with regard to his wound. He is tolerating the dressing changes without complication the wound is smaller though I feel like he is at the point where we need to try to do something to dry this up [name] little bit more I think switching from silver collagen to [name] silver alginate would be beneficial. 03/16/2021 upon evaluation today patient appears to be doing excellent in regard to his wound. He has been tolerating the dressing changes without complication. Fortunately there is no signs of active infection at this time. No fevers, chills, nausea, vomiting, or diarrhea. 04/02/2021 upon evaluation today patient appears to be doing well with regard to his wound. He really does not have anything terribly open but nonetheless he still has [name] lot of redness in the sacral area I  think there is still [name] lot of pressure getting to this region. With that being said I think he really needs to have more appropriate offloading I think that may be the biggest issue he spends [name] lot of time sitting in his recliner from what I am hearing. 5/23; there has been some improvement in the sacral area not so much in the area on the left buttock. Both of these very tiny punched-out areas with surrounding erythema. They seem to be tender out of proportion to what you might expect looking at them. We have been using silver alginate on this area  04/30/2021 upon evaluation today patient appears to be doing [name] little bit more poorly in regard to the wounds in the gluteal region and sacral Macintyre, Arkeem [name]. ([id number]) region. Fortunately there does not appear to be any signs of active infection which is great news but I do feel like there is some additional pressure injury here. I did discuss with the patient again today trying to keep pressure off the area I think this is still of utmost importance. Fortunately there is no signs of active infection systemically which is great news. 05/14/2021 upon evaluation today patient appears to be doing about the same in regard to his ulcer in the sacral region. Fortunately there does not appear to be any signs of active infection which is great news and overall very pleased in that regard. With that being said he does have some bleeding when cleansed with saline and gauze over the central portion of the wound I think this is indicative of the better wound bed which is at least good news. 06/01/2021 upon evaluation today patient's wounds actually appear to be doing better one is healed the central is still open but seems to be doing excellent. There is minimal slough noted. 06/11/2021 upon evaluation today patient appears to be doing about the same in regard to his wound in the sacral region. Fortunately there is no signs of infection currently which is great  news. With that being said I do believe that the patient is having some issues here with pain but again I do not feel like it is any worse than previous. Fortunately there is no signs of active infection at this time. 06/25/2021 upon evaluation today patient appears to be doing [name] little bit worse in regard to the periwound although the wound itself is measuring [name] little bit smaller. Fortunately I do not see any signs of infection and I am pleased with the smaller size of the wound. Nonetheless the Band-Aid that was on today is absolutely not the right thing to have on and to be honest I think this is what caused the small area of excoriation/skin tear around the edges of the wound with [name] Band-Aid which is stuck weight too tight. He is post have [name] border foam in place and [facility] he had [name] large Band-Aid and silver alginate which was not even on the wound bed. Objective Constitutional Well-nourished and well-hydrated in no acute distress. Vitals Time Taken: 3:13 PM, Temperature: 97.5 F, Pulse: 66 bpm, Respiratory Rate: 16 breaths/min, Blood Pressure: 134/71 mmHg. Respiratory normal breathing without difficulty. Psychiatric this patient is able to make decisions and demonstrates good insight into disease process. Alert and Oriented x 3. pleasant and cooperative. General Notes: Upon inspection patient's wound again showed signs of being smaller and happy in that regard. He does seem to be staying off of it more which I think is making all the difference in the world. With that being said the [facility] and the bandage that was covering the wound just does not the right thing to have and is actually cause [name] little bit of complication here to be honest. Integumentary (Hair, Skin) Wound #12 status is Open. Original cause of wound was Pressure Injury. The date acquired was: 01/18/2021. The wound has been in treatment 21 weeks. The wound is located on the Sacrum. The wound measures 0.3cm length  x 0.2cm width x 0.1cm depth; 0.047cm^2 area and 0.005cm^3 volume. There is Fat Layer (Subcutaneous Tissue) exposed. There is no tunneling or undermining  noted. There is [name] small amount of serous drainage noted. The wound margin is flat and intact. There is large (67-100%) pink granulation within the wound bed. There is [name] small (1-33%) amount of necrotic tissue within the wound bed including Adherent Slough. 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 ([id number]) Plan Follow-up Appointments: Return Appointment in 2 weeks. Edema Control - Lymphedema / Segmental Compressive Device / Other: Patient to wear own compression stockings. Remove compression stockings every night before going to bed and put on every morning when getting up. - bilat lower leg Elevate legs to the level of the heart and pump ankles as often as possible Elevate leg(s) parallel to the floor when sitting. Off-Loading: Gel wheelchair cushion - keep gel cushion under him when out of bed at all times Low air-loss mattress (Group 2) - needs an air mattress to offload pressure off of wounds Turn and reposition every 2 hours - keep off of sacral wound area/REPOSITION EVERY 2 HOURS FOR WOUND HEALIN General Notes: Extra pad of Hydrofera blue sent with patient-- WOUND #12: - Sacrum Wound Laterality: Cleanser: Soap and Water 3 x Per Week/30 Days Discharge Instructions: Gently cleanse wound with antibacterial soap, rinse and pat dry prior to dressing wounds Primary Dressing: Hydrofera Blue Ready Transfer Foam, 2.5x2.5 (in/in) 3 x Per Week/30 Days Discharge Instructions: Apply Hydrofera Blue Ready to wound bed as  directed Secondary Dressing: Mepilex Border Flex, 4x4 (in/in) (Generic) 3 x Per Week/30 Days Discharge Instructions: Recommend do not use bandaids-use [name] foam border for protectionApply to wound as directed. Do not cut. 1. Would recommend currently that we going continue with the wound care measures as before and the patient is in agreement with plan. This includes the use of the [facility] dressing which I think seems to be doing quite well. 2. I am also can recommend at this time we have the patient continue with the border foam dressing to cover which I think is good to be the best way to go as well. 3. I would also recommend the patient continue to offload is much as possible he is doing [name] good job of this as daughter tells me I am pleased to hear that I hope things continue to do well in that regard. We will see patient back for reevaluation in 1 week here in the clinic. If anything worsens or changes patient will contact our office for additional recommendations. Electronic Signature(s) Signed: 06/25/2021 3:55:11 PM By: Lenda Kelp PA-C Entered By: Lenda Kelp on 06/25/2021 15:55:11 [name]. ([id number]) -------------------------------------------------------------------------------- SuperBill Details Patient Name: [name]. Date of Service: 06/25/2021 Medical Record Number: [id number] Patient Account Number: [id number] Date of Birth/Sex: 03-Feb-1931 (87 y.o. M) Treating RN: Hansel Feinstein Primary Care Provider: [name] Other Clinician: Referring Provider: [name] Treating Provider/Extender: Rowan Blase in Treatment: 35 Diagnosis Coding ICD-10 Codes Code Description [phone] Pressure ulcer of sacral region, stage 2 Z79.01 Long term (current) use of anticoagulants Z95.0 Presence of cardiac pacemaker I10 Essential (primary) hypertension I50.42 Chronic combined systolic (congestive) and diastolic (congestive) heart failure I25.10  Atherosclerotic heart disease of native coronary artery without angina pectoris Facility Procedures CPT4 Code: [id number] Description: 02725 - WOUND CARE VISIT-LEV 2 EST PT Modifier: Quantity: 1 Physician Procedures CPT4 Code:  [id number] Description: 99214 - WC PHYS LEVEL 4 - EST PT Modifier: Quantity: 1 CPT4 Code: Description: ICD-10 Diagnosis Description L89.152 Pressure ulcer of sacral region, stage 2 Z79.01 Long term (current) use of anticoagulants Z95.0 Presence of cardiac pacemaker I10 Essential (primary) hypertension Modifier: Quantity: Electronic Signature(s) Signed: 06/25/2021 3:55:27 PM By: Lenda Kelp PA-C Entered By: Lenda Kelp on 06/25/2021 15:55:27

## 2021-07-04 ENCOUNTER — Encounter: Payer: Medicare Other | Admitting: Physician Assistant

## 2021-07-04 ENCOUNTER — Other Ambulatory Visit: Payer: Self-pay

## 2021-07-04 DIAGNOSIS — L89152 Pressure ulcer of sacral region, stage 2: Secondary | ICD-10-CM | POA: Diagnosis not present

## 2021-07-04 NOTE — Progress Notes (Addendum)
BING, DUFFEY ([id number]) Visit Report for 07/04/2021 Chief Complaint Document Details Patient Name: [name], [name] Date of Service: 07/04/2021 1:45 PM Medical Record Number: [id number] Patient Account Number: [id number] Date of Birth/Sex: 1931-09-17 (87 y.o. M) Treating RN: Primary Care Provider: [name] Other Clinician: Referring Provider: [name] Treating Provider/Extender: Rowan Blase in Treatment: 37 Information Obtained from: Patient Chief Complaint Sacral and Left Gluteal Ulcers Electronic Signature(s) Signed: 07/04/2021 2:06:35 PM By: Lenda Kelp PA-C Entered By: Lenda Kelp on 07/04/2021 14:06:35 [name] Hilts A. ([id number]) -------------------------------------------------------------------------------- HPI Details Patient Name: [name] Hilts A. Date of Service: 07/04/2021 1:45 PM Medical Record Number: [id number] Patient Account Number: [id number] Date of Birth/Sex: [birth year] (87 y.o. M) Treating RN: Primary Care Provider: [name] Other Clinician: Referring Provider: [name] Treating Provider/Extender: Rowan Blase in Treatment: 37 History of Present Illness HPI Description: 04/21/2019 ADMISSION This is an independent 87 year old man who lives in the independent part of [address] of [place]. He has a history of chronic lower extremity edema and wears compression stockings. He states about a month ago he was taking off the one on the right and pulled some skin off accidentally. He has had 2 small wounds on the right anterior and right medial lower extremity. I note looking through [facility] health link that he had multiple venous ultrasounds in 2015 and 16. These were DVT rule outs. He did have a Baker's cyst on the left. He has not not had a previous wound history although he did have a history of cellulitis in his legs. Past medical history; includes aortic stenosis status post mechanical AVR in 2007 on chronic Coumadin,  lower extremity edema with a history of cellulitis, history of MRSA. ABIs in our clinic were 1.1 on the right 6/3; patient with predominantly venous insufficiency ulcers in the right lower leg probably some degree of lymphedema. He had to wounds last week. We put him in 3 layer compression. The nurses at [facility] are changing the dressing. The area laterally is healed but he still has a small very painful area on the anterior tibial area. He is on Coumadin because of mechanical aortic valve. 6/10; venous insufficiency ulcers on the right lower leg. He has some degree of lymphedema. We have been using 3 layer compression with silver collagen small wound. Dimensions are improved. I gave him doxycycline last week which he tolerated because of surrounding erythema. This is improved also 6/17; arrives in clinic today with portable silver collagen dressing tightly adherent to the wound. Also felt that the wrap was too tight 3 layer being changed at the [facility] 6/24; patient's wound is small still adherent debris over the surface however even under illumination it was hard to see what was still open here. We have been using silver collagen 7/1; the patient arrives in the clinic today and the area on the right lower leg is healed. He has severe chronic venous insufficiency. He has 20/30 compression stockings. Readmission: 06/22/2020 on evaluation today patient presents for reevaluation here in our clinic although it has been a little over a year since we last saw him. He does have a history of lymphedema, venous insufficiency, anticoagulant therapy, he is on a pacemaker which is the reason for the anticoagulants, hypertension, and congestive heart failure. With that being said unfortunately he has been dealing with a wound of the left and right legs which has been giving him some trouble since arrival first 2021. That is in regard to the  left leg ulcer. In regard to the right leg  ulcer this is just a very small area that really I think will seal up quite nicely is more of the lymphedema opening than anything else. With that being said unfortunately the left leg is quite significant. We actually have noted that the patient's been dealing with this for quite some time and I think that there is a chance we may want to consider biopsy. With that being said he is on Coumadin therefore we were not able to perform a biopsy at this point. I think that something however in the future that may be warranted we may just have to take him off of the current Coumadin regimen in order to do this. We discussed doing it today but I am more concerned about the fact that he could have issues with uncontrolled bleeding and in that case we may have to send him to the ER. His daughter was not wanting to take that chance which I can completely understand. Nonetheless so far they have been using antibiotic ointment over the area I am just putting a protective dressing I do believe the patient is that he needs some type of compression. 07/07/2020 on evaluation today patient appears to be doing poorly in regard to his bilateral lower extremities today. He actually went to urgent care last night due to having pain in his legs due to the wraps. It appears they got somewhat tight on him because of the fact that he is having such significant swelling. He appears to be fluid overloaded I do not see any signs of infection actively at this point which is good news but unfortunately I think that this is something that may need to be addressed even by cardiology more so than just with a different or alternate type of wrap. I will contact his cardiologist, Dr. Mariah Milling, as well. 07/10/2020 upon evaluation today patient unfortunately appears to be doing worse even than when I saw him on Friday from the standpoint of where his legs stand. The main issue is that he has increased erythema although there is still not warm  to touch I am concerned about infection and about this getting significantly worse. Again my initial concern was more fluid overload and I discussed this with his cardiology office on Friday. With that being said I feel like this has gotten worse his daughter is in agreement and I think that the best option would be to send him to the hospital at this point. READMISSION 09/06/2020 Is a patient who has had 2 stays in this clinic firstly in 2020 and more recently from 06/02/2020 through 07/10/2020. He was cared for by [facility]. When he was last here he had bilateral lower extremity ulcers in the setting of chronic venous insufficiency with lymphedema. When he we last saw him he had weeping edema fluido Cellulitis and he was sent to the hospital. He was admitted initially from 06/30/2020 through 07/14/2020 with cellulitis plus stasis dermatitis. Treated empirically with vancomycin and had increases in his torsemide. He was then sent to the skilled level of [facility] but readmitted to hospital from 07/29/2020 through 08/10/2020 with blood loss anemia hemoglobin of 4 and OB positive stools. During this hospitalization he was noted to have a sacral wound at stage II. He was sent to [facility] again in the skilled level. He had compression on his legs with Coban. He is now back at his independent living setting. He has not been  wearing compression and his daughter feels there is already increasing swelling. To have stockings at home not exactly sure at what strength. [name], [name] ([id number]) Past medical history includes lymphedema, chronic venous insufficiency, pacemaker, congestive heart failure, hypertension, aortic valve stenosis status post mechanical valve replacement in 2002 on chronic Coumadin, chronic kidney disease stage IIIb, atrial fibrillation, peripheral neuropathy, large hiatal hernia. We did not test his ABIs in the clinic today. Readmission: 10/17/2020 on evaluation  today patient appears to be doing somewhat poorly in regard to his left leg and the fact that he does have an open wound at this point. He has not had the compression socks placed at the facility they have not been putting them on at all unfortunately. He does not have on the right leg currently and apparently there is significant put him on the left leg with the wound which I understand is that can be somewhat tight obviously. Nonetheless I do believe the right leg needs to have a compression sock and I do believe on the left side right now we can do something a little better in order to try to get the wound healed at this point. The patient is on torsemide and currently allopurinol for gout. He also has daily weights due to congestive heart failure I am assuming in order to monitor his fluid status overall. He is currently a DNR at the facility. 10/30/2020 upon evaluation today patient appears to be doing somewhat poorly at this time in regard to his bilateral lower extremities. His left leg which is the only place where he had a wound last week when I saw him is actually doing worse with new wound openings. The right leg also has a small wound as well. However the most concerning thing is that last week he did not have any signs of infection or erythema there is no warmth to touch over the leg. This week the entire leg is more sore he also has erythema extending from the ankle to just below the knee with the area being erythematous and warm to touch which I believe represents cellulitis at minimum. I am concerned about how this is spread so quickly and the fact that he is having increased pain and also not feeling as well. All in all I discussed with the patient that unfortunately I feel like he may be best served going to the ER for further evaluation of this issue currently to try to see about the potential for IV antibiotics and to have appropriate lab work performed. His daughter is present  during the visit today and she was included in the decision making at this point. 11/13/2020 upon evaluation today patient appears to be doing well with regard to his leg ulcer. Fortunately he does not appear to be showing signs of infection anything like it was previous. This is great news. He was sent home with IV antibiotic therapy that seems to have done extremely well for him. No fevers, chills, nausea, vomiting, or diarrhea. 11/27/2020 upon evaluation today patient appears to be doing quite well in regard to his wounds currently. Fortunately there is no signs of active infection at this time which is great news and I am very pleased in this regard. He did have some concerns or his daughter did about an area on his gluteal region. With that being said I see nothing that is actually open at this point which is great news I do think continuing with a derma cloud is a good  way to go. With regard to his leg he does have a new area on the right leg of open wound locations which is new compared to last visit we will need to address this as well. Otherwise he seems to be doing quite well in my opinion. 12/15/2020 upon evaluation today patient appears to be doing well with regard to his leg ulcer on the left. With that being said he did not have the Tubigrip on which I think is integral to getting this closed as he continues to weep. Subsequently I think the Tubigrip will help control some of the edema which will help him in that regard. Fortunately there is no signs of active infection at this time. No fevers, chills, nausea, vomiting, or diarrhea. 01/01/2021 upon evaluation today patient appears to be doing well with regard to his wound. This is measuring better and actually is significantly smaller. Fortunately there is no signs of active infection at this time. No fevers, chills, nausea, vomiting, or diarrhea. 01/15/2021 on evaluation today patient appears to be doing well with regard to his left leg ulcer  though he still continues to weep quite a bit he did not have the Tubigrip on the day that he supposed to. Fortunately there is no signs of active infection at this time. No fevers, chills, nausea, vomiting, or diarrhea. 01/29/2021 upon evaluation today patient appears to be doing well with regard to his right leg which is completely healed. His left leg though not healed is just a very small open area that is weeping. With regard to his gluteal region this is actually new and in fact he has a wound that is really in the sacral area. This is stated to be a stage II that may be the case there is really no slough buildup I Ernie Hew leave it as such for the time being. We will keep a close eye on things however and see how it proceeds. 02/12/2021 upon evaluation today patient appears to be doing excellent in regard to his leg ulcers. In fact everything appears to be closed as best I can tell at this point. I think he is ready to transition into his compression stocking. With that being said his gluteal region still is open although it appears to be fairly clean I think the collagen is still a good thing to do here. We will see how things continue to improve over the next week or so. 02/26/2021 on evaluation today patient appears to be doing well with regard to his wound. He is tolerating the dressing changes without complication the wound is smaller though I feel like he is at the point where we need to try to do something to dry this up a little bit more I think switching from silver collagen to a silver alginate would be beneficial. 03/16/2021 upon evaluation today patient appears to be doing excellent in regard to his wound. He has been tolerating the dressing changes without complication. Fortunately there is no signs of active infection at this time. No fevers, chills, nausea, vomiting, or diarrhea. 04/02/2021 upon evaluation today patient appears to be doing well with regard to his wound. He really does not  have anything terribly open but nonetheless he still has a lot of redness in the sacral area I think there is still a lot of pressure getting to this region. With that being said I think he really needs to have more appropriate offloading I think that may be the biggest issue he spends a lot  of time sitting in his recliner from what I am hearing. 5/23; there has been some improvement in the sacral area not so much in the area on the left buttock. Both of these very tiny punched-out areas with surrounding erythema. They seem to be tender out of proportion to what you might expect looking at them. We have been using silver alginate on this area 04/30/2021 upon evaluation today patient appears to be doing a little bit more poorly in regard to the wounds in the gluteal region and sacral region. Fortunately there does not appear to be any signs of active infection which is great news but I do feel like there is some additional pressure injury here. I did discuss with the patient again today trying to keep pressure off the area I think this is still of utmost importance. Fortunately there is no signs of active infection systemically which is great news. 05/14/2021 upon evaluation today patient appears to be doing about the same in regard to his ulcer in the sacral region. Fortunately there does [name], [name] ([id number]) not appear to be any signs of active infection which is great news and overall very pleased in that regard. With that being said he does have some bleeding when cleansed with saline and gauze over the central portion of the wound I think this is indicative of the better wound bed which is at least good news. 06/01/2021 upon evaluation today patient's wounds actually appear to be doing better one is healed the central is still open but seems to be doing excellent. There is minimal slough noted. 06/11/2021 upon evaluation today patient appears to be doing about the same in regard to his wound  in the sacral region. Fortunately there is no signs of infection currently which is great news. With that being said I do believe that the patient is having some issues here with pain but again I do not feel like it is any worse than previous. Fortunately there is no signs of active infection at this time. 06/25/2021 upon evaluation today patient appears to be doing a little bit worse in regard to the periwound although the wound itself is measuring a little bit smaller. Fortunately I do not see any signs of infection and I am pleased with the smaller size of the wound. Nonetheless the Band-Aid that was on today is absolutely not the right thing to have on and to be honest I think this is what caused the small area of excoriation/skin tear around the edges of the wound with a Band-Aid which is stuck weight too tight. He is post have a border foam in place and [facility] he had a large Band-Aid and silver alginate which was not even on the wound bed. 07/04/2021 upon evaluation today patient appears to be doing a little bit more poorly as compared to last time in fact the wound that closed before has another area right in that area this is not even the skin tear this is what appears to be potentially a reopening. He did not even have a dressing in place today which is unfortunate as well. With that being said he had a dressing on but it was nowhere on the wound. This has been on for a couple of days. Again I think he is sliding around a lot in the bed which makes this move but that means it needs to be checked more frequently to ensure that its in place and where it needs to be. With all  that being said I think still offloading is a big issue here. Electronic Signature(s) Signed: 07/05/2021 5:24:21 PM By: Lenda Kelp PA-C Entered By: Lenda Kelp on 07/05/2021 17:24:21 [name] ([id number]) -------------------------------------------------------------------------------- Physical Exam  Details Patient Name: [name] Hilts A. Date of Service: 07/04/2021 1:45 PM Medical Record Number: [id number] Patient Account Number: [id number] Date of Birth/Sex: Apr 06, 1931 (87 y.o. M) Treating RN: Primary Care Provider: [name] Other Clinician: Referring Provider: [name] Treating Provider/Extender: Rowan Blase in Treatment: 37 Constitutional Well-nourished and well-hydrated in no acute distress. Respiratory normal breathing without difficulty. Psychiatric this patient is able to make decisions and demonstrates good insight into disease process. Alert and Oriented x 3. pleasant and cooperative. Notes Upon inspection patient's wound bed actually showed signs of good granulation epithelization at this point. Fortunately there does not appear to be any evidence of active infection but nonetheless I think that he still having a lot of pain and he still spending too much time sitting causing pressure to this region. Electronic Signature(s) Signed: 07/05/2021 5:24:44 PM By: Lenda Kelp PA-C Entered By: Lenda Kelp on 07/05/2021 17:24:44 [name] ([id number]) -------------------------------------------------------------------------------- Physician Orders Details Patient Name: [name] Hilts A. Date of Service: 07/04/2021 1:45 PM Medical Record Number: [id number] Patient Account Number: [id number] Date of Birth/Sex: 1931-05-04 (87 y.o. M) Treating RN: Huel Coventry Primary Care Provider: [name] Other Clinician: Referring Provider: [name] Treating Provider/Extender: Rowan Blase in Treatment: 37 Verbal / Phone Orders: No Diagnosis Coding ICD-10 Coding Code Description L89.152 Pressure ulcer of sacral region, stage 2 Z79.01 Long term (current) use of anticoagulants Z95.0 Presence of cardiac pacemaker I10 Essential (primary) hypertension I50.42 Chronic combined systolic (congestive) and diastolic (congestive) heart failure I25.10  Atherosclerotic heart disease of native coronary artery without angina pectoris Follow-up Appointments o Return Appointment in 2 weeks. Edema Control - Lymphedema / Segmental Compressive Device / Other Bilateral Lower Extremities o Patient to wear own compression stockings. Remove compression stockings every night before going to bed and put on every morning when getting up. - bilat lower leg o Elevate legs to the level of the heart and pump ankles as often as possible o Elevate leg(s) parallel to the floor when sitting. Off-Loading o Gel wheelchair cushion - keep gel cushion under him when out of bed at all times o Low air-loss mattress (Group 2) - needs an air mattress to offload pressure off of wounds o Turn and reposition every 2 hours - keep off of sacral wound area/REPOSITION EVERY 2 HOURS FOR WOUND HEALING Wound Treatment Wound #12 - Sacrum Cleanser: Soap and Water 3 x Per Week/30 Days Discharge Instructions: Gently cleanse wound with antibacterial soap, rinse and pat dry prior to dressing wounds Primary Dressing: Hydrofera Blue Ready Transfer Foam, 2.5x2.5 (in/in) 3 x Per Week/30 Days Discharge Instructions: Apply Hydrofera Blue Ready to wound bed as directed Secondary Dressing: Mepilex Border Flex, 4x4 (in/in) (Generic) 3 x Per Week/30 Days Discharge Instructions: Recommend do not use bandaids-use a foam border for protectionApply to wound as directed. Do not cut. Wound #14 - Gluteus Wound Laterality: Left Cleanser: Soap and Water 3 x Per Week/30 Days Discharge Instructions: Gently cleanse wound with antibacterial soap, rinse and pat dry prior to dressing wounds Primary Dressing: Hydrofera Blue Ready Transfer Foam, 2.5x2.5 (in/in) 3 x Per Week/30 Days Discharge Instructions: Apply Hydrofera Blue Ready to wound bed as directed Secondary Dressing: Mepilex Border Flex, 4x4 (in/in) (Generic) 3 x Per Week/30 Days Discharge Instructions: Recommend do  not use bandaids-use  a foam border for protectionApply to wound as directed. Do not cut. Electronic Signature(s) Signed: 07/05/2021 4:34:12 PM By: Elliot Gurney, BSN, RN, CWS, [name] RN, BSN Signed: 07/05/2021 5:38:02 PM By: Lenda Kelp PA-C Previous Signature: 07/05/2021 3:46:24 PM Version By: Elliot Gurney BSN, RN, CWS, [name] RN, BSN [place], [place] A. ([id number]) Entered By: Elliot Gurney, BSN, RN, CWS, [name] on 07/05/2021 15:48:17 [name] Hilts AMarland Kitchen ([id number]) -------------------------------------------------------------------------------- Problem List Details Patient Name: TONI, HOFFMEISTER A. Date of Service: 07/04/2021 1:45 PM Medical Record Number: [id number] Patient Account Number: [id number] Date of Birth/Sex: 01/30/1931 (87 y.o. M) Treating RN: Primary Care Provider: [name] Other Clinician: Referring Provider: [name] Treating Provider/Extender: Rowan Blase in Treatment: 37 Active Problems ICD-10 Encounter Code Description Active Date MDM Diagnosis L89.152 Pressure ulcer of sacral region, stage 2 01/29/2021 No Yes Z79.01 Long term (current) use of anticoagulants 10/17/2020 No Yes Z95.0 Presence of cardiac pacemaker 10/17/2020 No Yes I10 Essential (primary) hypertension 10/17/2020 No Yes I50.42 Chronic combined systolic (congestive) and diastolic (congestive) heart 10/17/2020 No Yes failure I25.10 Atherosclerotic heart disease of native coronary artery without angina 10/17/2020 No Yes pectoris Inactive Problems ICD-10 Code Description Active Date Inactive Date I89.0 Lymphedema, not elsewhere classified 10/17/2020 10/17/2020 I87.2 Venous insufficiency (chronic) (peripheral) 10/17/2020 10/17/2020 J62.831 Non-pressure chronic ulcer of other part of left lower leg with fat layer exposed 10/17/2020 10/17/2020 L97.812 Non-pressure chronic ulcer of other part of right lower leg with fat layer 10/17/2020 10/17/2020 exposed Resolved Problems Electronic Signature(s) Signed: 07/04/2021 2:06:30 PM By: Geni Bers, Edison A. ([id number]) Entered By: Lenda Kelp on 07/04/2021 14:06:29 [name] Hilts A. ([id number]) -------------------------------------------------------------------------------- Progress Note Details Patient Name: [name] Hilts A. Date of Service: 07/04/2021 1:45 PM Medical Record Number: [id number] Patient Account Number: [id number] Date of Birth/Sex: Jan 12, 1931 (87 y.o. M) Treating RN: Primary Care Provider: [name] Other Clinician: Referring Provider: [name] Treating Provider/Extender: Rowan Blase in Treatment: 37 Subjective Chief Complaint Information obtained from Patient Sacral and Left Gluteal Ulcers History of Present Illness (HPI) 04/21/2019 ADMISSION This is an independent 87 year old man who lives in the independent part of [address] of [place]. He has a history of chronic lower extremity edema and wears compression stockings. He states about a month ago he was taking off the one on the right and pulled some skin off accidentally. He has had 2 small wounds on the right anterior and right medial lower extremity. I note looking through [facility] health link that he had multiple venous ultrasounds in 2015 and 16. These were DVT rule outs. He did have a Baker's cyst on the left. He has not not had a previous wound history although he did have a history of cellulitis in his legs. Past medical history; includes aortic stenosis status post mechanical AVR in 2007 on chronic Coumadin, lower extremity edema with a history of cellulitis, history of MRSA. ABIs in our clinic were 1.1 on the right 6/3; patient with predominantly venous insufficiency ulcers in the right lower leg probably some degree of lymphedema. He had to wounds last week. We put him in 3 layer compression. The nurses at [facility] are changing the dressing. The area laterally is healed but he still has a small very painful area on the anterior tibial area. He is on  Coumadin because of mechanical aortic valve. 6/10; venous insufficiency ulcers on the right lower leg. He has some degree of lymphedema. We have been using 3 layer compression with silver collagen small wound. Dimensions  are improved. I gave him doxycycline last week which he tolerated because of surrounding erythema. This is improved also 6/17; arrives in clinic today with portable silver collagen dressing tightly adherent to the wound. Also felt that the wrap was too tight 3 layer being changed at the [facility] 6/24; patient's wound is small still adherent debris over the surface however even under illumination it was hard to see what was still open here. We have been using silver collagen 7/1; the patient arrives in the clinic today and the area on the right lower leg is healed. He has severe chronic venous insufficiency. He has 20/30 compression stockings. Readmission: 06/22/2020 on evaluation today patient presents for reevaluation here in our clinic although it has been a little over a year since we last saw him. He does have a history of lymphedema, venous insufficiency, anticoagulant therapy, he is on a pacemaker which is the reason for the anticoagulants, hypertension, and congestive heart failure. With that being said unfortunately he has been dealing with a wound of the left and right legs which has been giving him some trouble since arrival first 2021. That is in regard to the left leg ulcer. In regard to the right leg ulcer this is just a very small area that really I think will seal up quite nicely is more of the lymphedema opening than anything else. With that being said unfortunately the left leg is quite significant. We actually have noted that the patient's been dealing with this for quite some time and I think that there is a chance we may want to consider biopsy. With that being said he is on Coumadin therefore we were not able to perform a biopsy at this point. I  think that something however in the future that may be warranted we may just have to take him off of the current Coumadin regimen in order to do this. We discussed doing it today but I am more concerned about the fact that he could have issues with uncontrolled bleeding and in that case we may have to send him to the ER. His daughter was not wanting to take that chance which I can completely understand. Nonetheless so far they have been using antibiotic ointment over the area I am just putting a protective dressing I do believe the patient is that he needs some type of compression. 07/07/2020 on evaluation today patient appears to be doing poorly in regard to his bilateral lower extremities today. He actually went to urgent care last night due to having pain in his legs due to the wraps. It appears they got somewhat tight on him because of the fact that he is having such significant swelling. He appears to be fluid overloaded I do not see any signs of infection actively at this point which is good news but unfortunately I think that this is something that may need to be addressed even by cardiology more so than just with a different or alternate type of wrap. I will contact his cardiologist, Dr. Mariah Milling, as well. 07/10/2020 upon evaluation today patient unfortunately appears to be doing worse even than when I saw him on Friday from the standpoint of where his legs stand. The main issue is that he has increased erythema although there is still not warm to touch I am concerned about infection and about this getting significantly worse. Again my initial concern was more fluid overload and I discussed this with his cardiology office on Friday. With that being said  I feel like this has gotten worse his daughter is in agreement and I think that the best option would be to send him to the hospital at this point. READMISSION 09/06/2020 Is a patient who has had 2 stays in this clinic firstly in 2020 and more  recently from 06/02/2020 through 07/10/2020. He was cared for by [facility]. When he was last here he had bilateral lower extremity ulcers in the setting of chronic venous insufficiency with lymphedema. When he we last saw him he had weeping edema fluido Cellulitis and he was sent to the hospital. He was admitted initially from 06/30/2020 through 07/14/2020 [facility], Suraj A. ([id number]) with cellulitis plus stasis dermatitis. Treated empirically with vancomycin and had increases in his torsemide. He was then sent to the skilled level of [facility] but readmitted to hospital from 07/29/2020 through 08/10/2020 with blood loss anemia hemoglobin of 4 and OB positive stools. During this hospitalization he was noted to have a sacral wound at stage II. He was sent to [facility] again in the skilled level. He had compression on his legs with Coban. He is now back at his independent living setting. He has not been wearing compression and his daughter feels there is already increasing swelling. To have stockings at home not exactly sure at what strength. Past medical history includes lymphedema, chronic venous insufficiency, pacemaker, congestive heart failure, hypertension, aortic valve stenosis status post mechanical valve replacement in 2002 on chronic Coumadin, chronic kidney disease stage IIIb, atrial fibrillation, peripheral neuropathy, large hiatal hernia. We did not test his ABIs in the clinic today. Readmission: 10/17/2020 on evaluation today patient appears to be doing somewhat poorly in regard to his left leg and the fact that he does have an open wound at this point. He has not had the compression socks placed at the facility they have not been putting them on at all unfortunately. He does not have on the right leg currently and apparently there is significant put him on the left leg with the wound which I understand is that can be somewhat tight obviously. Nonetheless I do believe the  right leg needs to have a compression sock and I do believe on the left side right now we can do something a little better in order to try to get the wound healed at this point. The patient is on torsemide and currently allopurinol for gout. He also has daily weights due to congestive heart failure I am assuming in order to monitor his fluid status overall. He is currently a DNR at the facility. 10/30/2020 upon evaluation today patient appears to be doing somewhat poorly at this time in regard to his bilateral lower extremities. His left leg which is the only place where he had a wound last week when I saw him is actually doing worse with new wound openings. The right leg also has a small wound as well. However the most concerning thing is that last week he did not have any signs of infection or erythema there is no warmth to touch over the leg. This week the entire leg is more sore he also has erythema extending from the ankle to just below the knee with the area being erythematous and warm to touch which I believe represents cellulitis at minimum. I am concerned about how this is spread so quickly and the fact that he is having increased pain and also not feeling as well. All in all I discussed with the patient that unfortunately  I feel like he may be best served going to the ER for further evaluation of this issue currently to try to see about the potential for IV antibiotics and to have appropriate lab work performed. His daughter is present during the visit today and she was included in the decision making at this point. 11/13/2020 upon evaluation today patient appears to be doing well with regard to his leg ulcer. Fortunately he does not appear to be showing signs of infection anything like it was previous. This is great news. He was sent home with IV antibiotic therapy that seems to have done extremely well for him. No fevers, chills, nausea, vomiting, or diarrhea. 11/27/2020 upon evaluation today  patient appears to be doing quite well in regard to his wounds currently. Fortunately there is no signs of active infection at this time which is great news and I am very pleased in this regard. He did have some concerns or his daughter did about an area on his gluteal region. With that being said I see nothing that is actually open at this point which is great news I do think continuing with a derma cloud is a good way to go. With regard to his leg he does have a new area on the right leg of open wound locations which is new compared to last visit we will need to address this as well. Otherwise he seems to be doing quite well in my opinion. 12/15/2020 upon evaluation today patient appears to be doing well with regard to his leg ulcer on the left. With that being said he did not have the Tubigrip on which I think is integral to getting this closed as he continues to weep. Subsequently I think the Tubigrip will help control some of the edema which will help him in that regard. Fortunately there is no signs of active infection at this time. No fevers, chills, nausea, vomiting, or diarrhea. 01/01/2021 upon evaluation today patient appears to be doing well with regard to his wound. This is measuring better and actually is significantly smaller. Fortunately there is no signs of active infection at this time. No fevers, chills, nausea, vomiting, or diarrhea. 01/15/2021 on evaluation today patient appears to be doing well with regard to his left leg ulcer though he still continues to weep quite a bit he did not have the Tubigrip on the day that he supposed to. Fortunately there is no signs of active infection at this time. No fevers, chills, nausea, vomiting, or diarrhea. 01/29/2021 upon evaluation today patient appears to be doing well with regard to his right leg which is completely healed. His left leg though not healed is just a very small open area that is weeping. With regard to his gluteal region this is  actually new and in fact he has a wound that is really in the sacral area. This is stated to be a stage II that may be the case there is really no slough buildup I Ernie Hew leave it as such for the time being. We will keep a close eye on things however and see how it proceeds. 02/12/2021 upon evaluation today patient appears to be doing excellent in regard to his leg ulcers. In fact everything appears to be closed as best I can tell at this point. I think he is ready to transition into his compression stocking. With that being said his gluteal region still is open although it appears to be fairly clean I think the collagen is still a good  thing to do here. We will see how things continue to improve over the next week or so. 02/26/2021 on evaluation today patient appears to be doing well with regard to his wound. He is tolerating the dressing changes without complication the wound is smaller though I feel like he is at the point where we need to try to do something to dry this up a little bit more I think switching from silver collagen to a silver alginate would be beneficial. 03/16/2021 upon evaluation today patient appears to be doing excellent in regard to his wound. He has been tolerating the dressing changes without complication. Fortunately there is no signs of active infection at this time. No fevers, chills, nausea, vomiting, or diarrhea. 04/02/2021 upon evaluation today patient appears to be doing well with regard to his wound. He really does not have anything terribly open but nonetheless he still has a lot of redness in the sacral area I think there is still a lot of pressure getting to this region. With that being said I think he really needs to have more appropriate offloading I think that may be the biggest issue he spends a lot of time sitting in his recliner from what I am hearing. 5/23; there has been some improvement in the sacral area not so much in the area on the left buttock. Both of  these very tiny punched-out areas with surrounding erythema. They seem to be tender out of proportion to what you might expect looking at them. We have been using silver alginate on this area 04/30/2021 upon evaluation today patient appears to be doing a little bit more poorly in regard to the wounds in the gluteal region and sacral [name], [name] ([id number]) region. Fortunately there does not appear to be any signs of active infection which is great news but I do feel like there is some additional pressure injury here. I did discuss with the patient again today trying to keep pressure off the area I think this is still of utmost importance. Fortunately there is no signs of active infection systemically which is great news. 05/14/2021 upon evaluation today patient appears to be doing about the same in regard to his ulcer in the sacral region. Fortunately there does not appear to be any signs of active infection which is great news and overall very pleased in that regard. With that being said he does have some bleeding when cleansed with saline and gauze over the central portion of the wound I think this is indicative of the better wound bed which is at least good news. 06/01/2021 upon evaluation today patient's wounds actually appear to be doing better one is healed the central is still open but seems to be doing excellent. There is minimal slough noted. 06/11/2021 upon evaluation today patient appears to be doing about the same in regard to his wound in the sacral region. Fortunately there is no signs of infection currently which is great news. With that being said I do believe that the patient is having some issues here with pain but again I do not feel like it is any worse than previous. Fortunately there is no signs of active infection at this time. 06/25/2021 upon evaluation today patient appears to be doing a little bit worse in regard to the periwound although the wound itself is measuring  a little bit smaller. Fortunately I do not see any signs of infection and I am pleased with the smaller size of the wound. Nonetheless the Band-Aid  that was on today is absolutely not the right thing to have on and to be honest I think this is what caused the small area of excoriation/skin tear around the edges of the wound with a Band-Aid which is stuck weight too tight. He is post have a border foam in place and [facility] he had a large Band-Aid and silver alginate which was not even on the wound bed. 07/04/2021 upon evaluation today patient appears to be doing a little bit more poorly as compared to last time in fact the wound that closed before has another area right in that area this is not even the skin tear this is what appears to be potentially a reopening. He did not even have a dressing in place today which is unfortunate as well. With that being said he had a dressing on but it was nowhere on the wound. This has been on for a couple of days. Again I think he is sliding around a lot in the bed which makes this move but that means it needs to be checked more frequently to ensure that its in place and where it needs to be. With all that being said I think still offloading is a big issue here. Objective Constitutional Well-nourished and well-hydrated in no acute distress. Vitals Time Taken: 2:02 PM, Temperature: 97.6 F, Pulse: 61 bpm, Respiratory Rate: 16 breaths/min, Blood Pressure: 104/80 mmHg. Respiratory normal breathing without difficulty. Psychiatric this patient is able to make decisions and demonstrates good insight into disease process. Alert and Oriented x 3. pleasant and cooperative. General Notes: Upon inspection patient's wound bed actually showed signs of good granulation epithelization at this point. Fortunately there does not appear to be any evidence of active infection but nonetheless I think that he still having a lot of pain and he still spending too much  time sitting causing pressure to this region. Integumentary (Hair, Skin) Wound #12 status is Open. Original cause of wound was Pressure Injury. The date acquired was: 01/18/2021. The wound has been in treatment 22 weeks. The wound is located on the Sacrum. The wound measures 0.3cm length x 0.3cm width x 0.2cm depth; 0.071cm^2 area and 0.014cm^3 volume. There is a small amount of serous drainage noted. Wound #14 status is Open. Original cause of wound was Trauma. The date acquired was: 06/25/2021. The wound is located on the Left Gluteus. The wound measures 0.9cm length x 0.5cm width x 0.1cm depth; 0.353cm^2 area and 0.035cm^3 volume. There is Fat Layer (Subcutaneous Tissue) exposed. There is no tunneling or undermining noted. There is a medium amount of serous drainage noted. The wound margin is flat and intact. There is small (1-33%) pink granulation within the wound bed. There is a medium (34-66%) amount of necrotic tissue within the wound bed including Adherent Slough. Assessment Active Problems ICD-10 Pressure ulcer of sacral region, stage 2 Long term (current) use of anticoagulants Presence of cardiac pacemaker [name], [name] 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 Edema Control - Lymphedema / Segmental Compressive Device / Other: Patient to wear own compression stockings. Remove compression stockings every night before going to bed and put on every morning when getting up. - bilat lower leg Elevate legs to the level of the heart and pump ankles as often as possible Elevate leg(s) parallel to the floor when sitting. Off-Loading: Gel wheelchair cushion -  keep gel cushion under him when out of bed at all times Low air-loss mattress (Group 2) - needs an air  mattress to offload pressure off of wounds Turn and reposition every 2 hours - keep off of sacral wound area/REPOSITION EVERY 2 HOURS FOR WOUND HEALING WOUND #12: - Sacrum Wound Laterality: Cleanser: Soap and Water 3 x Per Week/30 Days Discharge Instructions: Gently cleanse wound with antibacterial soap, rinse and pat dry prior to dressing wounds Primary Dressing: Hydrofera Blue Ready Transfer Foam, 2.5x2.5 (in/in) 3 x Per Week/30 Days Discharge Instructions: Apply Hydrofera Blue Ready to wound bed as directed Secondary Dressing: Mepilex Border Flex, 4x4 (in/in) (Generic) 3 x Per Week/30 Days Discharge Instructions: Recommend do not use bandaids-use a foam border for protectionApply to wound as directed. Do not cut. WOUND #14: - Gluteus Wound Laterality: Left Cleanser: Soap and Water 3 x Per Week/30 Days Discharge Instructions: Gently cleanse wound with antibacterial soap, rinse and pat dry prior to dressing wounds Primary Dressing: Hydrofera Blue Ready Transfer Foam, 2.5x2.5 (in/in) 3 x Per Week/30 Days Discharge Instructions: Apply Hydrofera Blue Ready to wound bed as directed Secondary Dressing: Mepilex Border Flex, 4x4 (in/in) (Generic) 3 x Per Week/30 Days Discharge Instructions: Recommend do not use bandaids-use a foam border for protectionApply to wound as directed. Do not cut. 1. Would recommend that we continue with the [facility] I think this is still the best option. With that being said we need to make sure this is actually staying in place and that means that the staff at the facility needs to check this more frequently. 2. I am also can recommend that we actually going to continue with the border foam dressing to cover which I think is doing a good job. 3. I am also can I suggest the patient should continue to be monitored for any signs of worsening or infection right now I do not see any issues at this point. We will see patient back for reevaluation in 2 weeks here in the  clinic. If anything worsens or changes patient will contact our office for additional recommendations. Electronic Signature(s) Signed: 07/05/2021 5:26:54 PM By: Lenda Kelp PA-C Entered By: Lenda Kelp on 07/05/2021 17:26:54 [name] Hilts A. ([id number]) -------------------------------------------------------------------------------- SuperBill Details Patient Name: [name] Hilts A. Date of Service: 07/04/2021 Medical Record Number: [id number] Patient Account Number: [id number] Date of Birth/Sex: Mar 31, 1931 (87 y.o. M) Treating RN: Huel Coventry Primary Care Provider: [name] Other Clinician: Referring Provider: [name] Treating Provider/Extender: Rowan Blase in Treatment: 37 Diagnosis Coding ICD-10 Codes Code Description [phone] Pressure ulcer of sacral region, stage 2 Z79.01 Long term (current) use of anticoagulants Z95.0 Presence of cardiac pacemaker I10 Essential (primary) hypertension I50.42 Chronic combined systolic (congestive) and diastolic (congestive) heart failure I25.10 Atherosclerotic heart disease of native coronary artery without angina pectoris Facility Procedures CPT4 Code: [id number] Description: 99213 - WOUND CARE VISIT-LEV 3 EST PT Modifier: Quantity: 1 Physician Procedures CPT4 Code: [id number] Description: 99214 - WC PHYS LEVEL 4 - EST PT Modifier: Quantity: 1 CPT4 Code: Description: ICD-10 Diagnosis Description L89.152 Pressure ulcer of sacral region, stage 2 Z79.01 Long term (current) use of anticoagulants Z95.0 Presence of cardiac pacemaker I10 Essential (primary) hypertension Modifier: Quantity: Electronic Signature(s) Signed: 07/05/2021 5:27:18 PM By: Lenda Kelp PA-C Previous Signature: 07/05/2021 3:47:20 PM Version By: Elliot Gurney, BSN, RN, CWS, [name] RN, BSN Entered By: Lenda Kelp on 07/05/2021 17:27:17

## 2021-07-04 NOTE — Progress Notes (Addendum)
[name], [name] ([id number]) Visit Report for 07/04/2021 Arrival Information Details Patient Name: [name], [name] Date of Service: 07/04/2021 1:45 PM Medical Record Number: [id number] Patient Account Number: [id number] Date of Birth/Sex: [birth year] (87 y.o. M) Treating RN: Huel CoventryWoody, [name] Primary Care Naleah Kofoed: Marcelino DusterJohnston, John Other Clinician: Referring Melyna Huron: Marcelino DusterJohnston, John Treating Devario Bucklew/Extender: Rowan BlaseStone, Hoyt Weeks in Treatment: 37 Visit Information History Since Last Visit Has Dressing in Place as Prescribed: No Patient Arrived: Wheel Chair Pain Present Now: Yes Arrival Time: 14:00 Accompanied By: daughter Transfer Assistance: Manual Patient Identification Verified: Yes Secondary Verification Process Completed: Yes Patient Requires Transmission-Based Precautions: No Patient Has Alerts: No Notes Dressing was not on the wound itself. It was up higher on his back. Electronic Signature(s) Signed: 07/05/2021 4:34:12 PM By: Elliot GurneyWoody, BSN, RN, CWS, [name] RN, BSN Entered By: Elliot GurneyWoody, BSN, RN, CWS, [name] on 07/04/2021 14:09:58 [name], [name] ([id number]) -------------------------------------------------------------------------------- Clinic Level of Care Assessment Details Patient Name: [name], [name] Date of Service: 07/04/2021 1:45 PM Medical Record Number: [id number] Patient Account Number: [id number] Date of Birth/Sex: [birth year] (87 y.o. M) Treating RN: Huel CoventryWoody, [name] Primary Care Siriah Treat: Marcelino DusterJohnston, John Other Clinician: Referring Sravya Grissom: Marcelino DusterJohnston, John Treating Lulani Bour/Extender: Rowan BlaseStone, Hoyt Weeks in Treatment: 37 Clinic Level of Care Assessment Items TOOL 4 Quantity Score []  - Use when only an EandM is performed on FOLLOW-UP visit 0 ASSESSMENTS - Nursing Assessment / Reassessment X - Reassessment of Co-morbidities (includes updates in patient status) 1 10 X- 1 5 Reassessment of Adherence to Treatment Plan ASSESSMENTS - Wound and Skin Assessment / Reassessment []  - Simple  Wound Assessment / Reassessment - one wound 0 X- 2 5 Complex Wound Assessment / Reassessment - multiple wounds []  - 0 Dermatologic / Skin Assessment (not related to wound area) ASSESSMENTS - Focused Assessment []  - Circumferential Edema Measurements - multi extremities 0 []  - 0 Nutritional Assessment / Counseling / Intervention []  - 0 Lower Extremity Assessment (monofilament, tuning fork, pulses) []  - 0 Peripheral Arterial Disease Assessment (using hand held doppler) ASSESSMENTS - Ostomy and/or Continence Assessment and Care []  - Incontinence Assessment and Management 0 []  - 0 Ostomy Care Assessment and Management (repouching, etc.) PROCESS - Coordination of Care X - Simple Patient / Family Education for ongoing care 1 15 []  - 0 Complex (extensive) Patient / Family Education for ongoing care []  - 0 Staff obtains ChiropractorConsents, Records, Test Results / Process Orders []  - 0 Staff telephones HHA, Nursing Homes / Clarify orders / etc []  - 0 Routine Transfer to another Facility (non-emergent condition) []  - 0 Routine Hospital Admission (non-emergent condition) []  - 0 New Admissions / Manufacturing engineernsurance Authorizations / Ordering NPWT, Apligraf, etc. []  - 0 Emergency Hospital Admission (emergent condition) X- 1 10 Simple Discharge Coordination []  - 0 Complex (extensive) Discharge Coordination PROCESS - Special Needs []  - Pediatric / Minor Patient Management 0 []  - 0 Isolation Patient Management []  - 0 Hearing / Language / Visual special needs []  - 0 Assessment of Community assistance (transportation, D/C planning, etc.) []  - 0 Additional assistance / Altered mentation []  - 0 Support Surface(s) Assessment (bed, cushion, seat, etc.) INTERVENTIONS - Wound Cleansing / Measurement Gunderman, [name] ([id number]) []  - 0 Simple Wound Cleansing - one wound X- 2 5 Complex Wound Cleansing - multiple wounds X- 1 5 Wound Imaging (photographs - any number of wounds) []  - 0 Wound Tracing  (instead of photographs) []  - 0 Simple Wound Measurement - one wound X- 2 5 Complex Wound Measurement - multiple wounds INTERVENTIONS - Wound  Dressings []  - Small Wound Dressing one or multiple wounds 0 X- 2 15 Medium Wound Dressing one or multiple wounds []  - 0 Large Wound Dressing one or multiple wounds []  - 0 Application of Medications - topical []  - 0 Application of Medications - injection INTERVENTIONS - Miscellaneous []  - External ear exam 0 []  - 0 Specimen Collection (cultures, biopsies, blood, body fluids, etc.) []  - 0 Specimen(s) / Culture(s) sent or taken to Lab for analysis []  - 0 Patient Transfer (multiple staff / / Similar devices) []  - 0 Simple Staple / Suture removal (25 or less) []  - 0 Complex Staple / Suture removal (26 or more) []  - 0 Hypo / Hyperglycemic Management (close monitor of Blood Glucose) []  - 0 Ankle / Brachial Index (ABI) - do not check if billed separately X- 1 5 Vital Signs Has the patient been seen at the hospital within the last three years: Yes Total Score: 110 Level Of Care: New/Established - Level 3 Electronic Signature(s) Signed: 07/05/2021 4:34:12 PM By: , BSN, RN, CWS, [name] RN, BSN Entered By: , BSN, RN, CWS, [name] on 07/05/2021 15:47:13 ( ) -------------------------------------------------------------------------------- Encounter Discharge Information Details Patient Name: A. Date of Service: 07/04/2021 1:45 PM Medical Record Number: Patient Account Number: Date of Birth/Sex: 1931-08-02 (87 y.o. M) Treating RN: 09/04/2021 Primary Care Navjot Loera: Elliot Gurney Other Clinician: Referring Tarrah Furuta: Elliot Gurney Treating Gerrett Loman/Extender: 09/04/2021 in Treatment: 37 Encounter Discharge Information Items Discharge Condition: Stable Ambulatory Status: Ambulatory Discharge Destination: Home Transportation: Private Auto Accompanied By:  daughter Schedule Follow-up Appointment: Yes Clinical Summary of Care: Electronic Signature(s) Signed: 07/05/2021 3:50:58 PM By: [name] Hilts, BSN, RN, CWS, [name] RN, BSN Entered By: 09/03/2021, BSN, RN, CWS, [name] on 07/05/2021 15:50:58 [id number] (09/14/1931) -------------------------------------------------------------------------------- Lower Extremity Assessment Details Patient Name: [name], [name] Date of Service: 07/04/2021 1:45 PM Medical Record Number: [name] Patient Account Number: [name] Date of Birth/Sex: 05-04-1931 (87 y.o. M) Treating RN: 09/04/2021 Primary Care Gesselle Fitzsimons: Elliot Gurney Other Clinician: Referring Bond Grieshop: Elliot Gurney Treating Delorise Hunkele/Extender: 09/04/2021 in Treatment: 37 Electronic Signature(s) Signed: 07/05/2021 4:34:12 PM By: [id number], BSN, RN, CWS, [name] RN, BSN Entered By: [name] Hilts, BSN, RN, CWS, [name] on 07/04/2021 14:14:34 [id number] ([id number]) -------------------------------------------------------------------------------- Multi Wound Chart Details Patient Name: 09/14/1931 A. Date of Service: 07/04/2021 1:45 PM Medical Record Number: Huel Coventry Patient Account Number: [name] Date of Birth/Sex: 02/18/1931 (87 y.o. M) Treating RN: 09/04/2021 Primary Care Reid Regas: Elliot Gurney Other Clinician: Referring Cale Decarolis: Elliot Gurney Treating Jad Johansson/Extender: 09/03/2021 in Treatment: 37 Vital Signs Height(in): Pulse(bpm): 61 Weight(lbs): Blood Pressure(mmHg): 104/80 Body Mass Index(BMI): Temperature(F): 97.6 Respiratory Rate(breaths/min): 16 Photos: [N/A:N/A] Wound Location: Sacrum Left Gluteus N/A Wounding Event: Pressure Injury Trauma N/A Primary Etiology: Pressure Ulcer Pressure Ulcer N/A Comorbid History: N/A Cataracts, Sleep Apnea, Congestive N/A Heart Failure, Coronary Artery Disease, Hypertension, Osteoarthritis Date Acquired: 01/18/2021 06/25/2021 N/A Weeks of Treatment: 22 0 N/A Wound Status: Open Open  N/A Measurements L x W x D (cm) 0.3x0.3x0.2 0.9x0.5x0.1 N/A Area (cm) : 0.071 0.353 N/A Volume (cm) : 0.014 0.035 N/A % Reduction in Area: 78.50% 0.00% N/A % Reduction in Volume: 85.90% 0.00% N/A Classification: Category/Stage II Category/Stage II N/A Exudate Amount: Small Medium N/A Exudate Type: Serous Serous N/A Exudate Color: amber amber N/A Wound Margin: N/A Flat and Intact N/A Granulation Amount: N/A Small (1-33%) N/A Granulation Quality: N/A Pink N/A Necrotic Amount: N/A Medium (34-66%) N/A Epithelialization: N/A None N/A  Treatment Notes Electronic Signature(s) Signed: 07/05/2021 3:45:43 PM By: Elliot Gurney, BSN, RN, CWS, [name] RN, BSN Entered By: Elliot Gurney, BSN, RN, CWS, [name] on 07/05/2021 15:45:42 [name] Quails ([id number]) -------------------------------------------------------------------------------- Multi-Disciplinary Care Plan Details Patient Name: [name], [name] Date of Service: 07/04/2021 1:45 PM Medical Record Number: [id number] Patient Account Number: [id number] Date of Birth/Sex: 03-Jul-1931 (87 y.o. M) Treating RN: Huel Coventry Primary Care Pace Lamadrid: [name] Other Clinician: Referring Emalynn Clewis: [name] Treating Akeyla Molden/Extender: Rowan Blase in Treatment: 37 Active Inactive Necrotic Tissue Nursing Diagnoses: Impaired tissue integrity related to necrotic/devitalized tissue Knowledge deficit related to management of necrotic/devitalized tissue Goals: Necrotic/devitalized tissue will be minimized in the wound bed Date Initiated: 07/04/2021 Target Resolution Date: 07/04/2021 Goal Status: Active Patient/caregiver will verbalize understanding of reason and process for debridement of necrotic tissue Date Initiated: 07/04/2021 Target Resolution Date: 07/04/2021 Goal Status: Active Interventions: Assess patient pain level pre-, during and post procedure and prior to discharge Provide education on necrotic tissue and debridement process Treatment  Activities: Apply topical anesthetic as ordered : 07/04/2021 Notes: Electronic Signature(s) Signed: 07/05/2021 3:45:34 PM By: Elliot Gurney, BSN, RN, CWS, [name] RN, BSN Entered By: Elliot Gurney, BSN, RN, CWS, [name] on 07/05/2021 15:45:34 [name], [name] ([id number]) -------------------------------------------------------------------------------- Pain Assessment Details Patient Name: [name] Hilts A. Date of Service: 07/04/2021 1:45 PM Medical Record Number: [id number] Patient Account Number: [id number] Date of Birth/Sex: [birth year] (87 y.o. M) Treating RN: Huel Coventry Primary Care Rylend Pietrzak: [name] Other Clinician: Referring Gittel Mccamish: [name] Treating Brydan Downard/Extender: Rowan Blase in Treatment: 37 Active Problems Location of Pain Severity and Description of Pain Patient Has Paino Yes Site Locations Pain Location: Pain in Ulcers Rate the pain. Current Pain Level: 5 Pain Management and Medication Current Pain Management: Electronic Signature(s) Signed: 07/05/2021 4:34:12 PM By: Elliot Gurney, BSN, RN, CWS, [name] RN, BSN Entered By: Elliot Gurney, BSN, RN, CWS, [name] on 07/04/2021 14:10:54 [name] Quails ([id number]) -------------------------------------------------------------------------------- Patient/Caregiver Education Details Patient Name: [name] Hilts A. Date of Service: 07/04/2021 1:45 PM Medical Record Number: [id number] Patient Account Number: [id number] Date of Birth/Gender: [birth year] (87 y.o. M) Treating RN: Huel Coventry Primary Care Physician: [name] Other Clinician: Referring Physician: [name] Treating Physician/Extender: Rowan Blase in Treatment: 37 Education Assessment Education Provided To: Patient Education Topics Provided Wound/Skin Impairment: Handouts: Caring for Your Ulcer, Other: wound care as prescribed Methods: Demonstration Responses: State content correctly Electronic Signature(s) Signed: 07/05/2021 4:34:12 PM By: Elliot Gurney, BSN, RN, CWS, [name]  RN, BSN Entered By: Elliot Gurney, BSN, RN, CWS, [name] on 07/05/2021 15:49:53 [name] Hilts A. ([id number]) -------------------------------------------------------------------------------- Wound Assessment Details Patient Name: [name] Hilts A. Date of Service: 07/04/2021 1:45 PM Medical Record Number: [id number] Patient Account Number: [id number] Date of Birth/Sex: 1931/03/31 (87 y.o. M) Treating RN: Huel Coventry Primary Care Robena Ewy: [name] Other Clinician: Referring Koy Lamp: [name] Treating Araeya Lamb/Extender: Allen Derry Weeks in Treatment: 37 Wound Status Wound Number: 12 Primary Etiology: Pressure Ulcer Wound Location: Sacrum Wound Status: Open Wounding Event: Pressure Injury Date Acquired: 01/18/2021 Weeks Of Treatment: 22 Clustered Wound: No Photos Photo Uploaded By: Elliot Gurney, BSN, RN, CWS, [name] on 07/04/2021 15:53:35 Wound Measurements Length: (cm) 0.3 Width: (cm) 0.3 Depth: (cm) 0.2 Area: (cm) 0.071 Volume: (cm) 0.014 % Reduction in Area: 78.5% % Reduction in Volume: 85.9% Wound Description Classification: Category/Stage II Exudate Amount: Small Exudate Type: Serous Exudate Color: amber Treatment Notes Wound #12 (Sacrum) Cleanser Soap and Water Discharge Instruction: Gently cleanse wound with antibacterial soap, rinse and pat dry prior to dressing wounds Peri-Wound Care Topical Primary Dressing Hydrofera  Blue Ready Transfer Foam, 2.5x2.5 (in/in) Discharge Instruction: Apply Hydrofera Blue Ready to wound bed as directed Secondary Dressing Mepilex Border Flex, 4x4 (in/in) [name], Tirso A. ([id number]) Discharge Instruction: Recommend do not use bandaids-use a foam border for protectionApply to wound as directed. Do not cut. Secured With Compression Wrap Compression Stockings Facilities manager) Signed: 07/05/2021 4:34:12 PM By: Elliot Gurney, BSN, RN, CWS, [name] RN, BSN Entered By: Elliot Gurney, BSN, RN, CWS, [name] on 07/04/2021 14:11:07 [name] Quails  ([id number]) -------------------------------------------------------------------------------- Wound Assessment Details Patient Name: [name] Hilts A. Date of Service: 07/04/2021 1:45 PM Medical Record Number: [id number] Patient Account Number: [id number] Date of Birth/Sex: 1931/07/09 (87 y.o. M) Treating RN: Huel Coventry Primary Care Kendrik Mcshan: [name] Other Clinician: Referring Jaydyn Menon: [name] Treating Ciclaly Mulcahey/Extender: Allen Derry Weeks in Treatment: 37 Wound Status Wound Number: 14 Primary Pressure Ulcer Etiology: Wound Location: Left Gluteus Wound Open Wounding Event: Trauma Status: Date Acquired: 06/25/2021 Comorbid Cataracts, Sleep Apnea, Congestive Heart Failure, Weeks Of Treatment: 0 History: Coronary Artery Disease, Hypertension, Osteoarthritis Clustered Wound: No Photos Photo Uploaded By: Elliot Gurney, BSN, RN, CWS, [name] on 07/04/2021 15:53:36 Wound Measurements Length: (cm) 0.9 Width: (cm) 0.5 Depth: (cm) 0.1 Area: (cm) 0.353 Volume: (cm) 0.035 % Reduction in Area: 0% % Reduction in Volume: 0% Epithelialization: None Tunneling: No Undermining: No Wound Description Classification: Category/Stage II Wound Margin: Flat and Intact Exudate Amount: Medium Exudate Type: Serous Exudate Color: amber Foul Odor After Cleansing: No Slough/Fibrino Yes Wound Bed Granulation Amount: Small (1-33%) Exposed Structure Granulation Quality: Pink Fascia Exposed: No Necrotic Amount: Medium (34-66%) Fat Layer (Subcutaneous Tissue) Exposed: Yes Necrotic Quality: Adherent Slough Tendon Exposed: No Muscle Exposed: No Joint Exposed: No Bone Exposed: No Treatment Notes Wound #14 (Gluteus) Wound Laterality: Left Cleanser Soap and Water Discharge Instruction: Gently cleanse wound with antibacterial soap, rinse and pat dry prior to dressing wounds Vanderweele, Shuan A. ([id number]) Peri-Wound Care Topical Primary Dressing Hydrofera Blue Ready Transfer Foam, 2.5x2.5  (in/in) Discharge Instruction: Apply Hydrofera Blue Ready to wound bed as directed Secondary Dressing Mepilex Border Flex, 4x4 (in/in) Discharge Instruction: Recommend do not use bandaids-use a foam border for protectionApply to wound as directed. Do not cut. Secured With Compression Wrap Compression Stockings Facilities manager) Signed: 07/05/2021 4:34:12 PM By: Elliot Gurney, BSN, RN, CWS, [name] RN, BSN Entered By: Elliot Gurney, BSN, RN, CWS, [name] on 07/04/2021 14:13:22 [name] Quails ([id number]) -------------------------------------------------------------------------------- Vitals Details Patient Name: [name] Hilts A. Date of Service: 07/04/2021 1:45 PM Medical Record Number: [id number] Patient Account Number: [id number] Date of Birth/Sex: August 01, 1931 (87 y.o. M) Treating RN: Huel Coventry Primary Care Karmello Abercrombie: [name] Other Clinician: Referring Elizah Lydon: [name] Treating Aydan Phoenix/Extender: Rowan Blase in Treatment: 37 Vital Signs Time Taken: 14:02 Temperature (F): 97.6 Pulse (bpm): 61 Respiratory Rate (breaths/min): 16 Blood Pressure (mmHg): 104/80 Reference Range: 80 - 120 mg / dl Electronic Signature(s) Signed: 07/05/2021 4:34:12 PM By: Elliot Gurney, BSN, RN, CWS, [name] RN, BSN Entered By: Elliot Gurney, BSN, RN, CWS, [name] on 07/04/2021 14:10:26

## 2021-07-06 ENCOUNTER — Telehealth: Payer: Self-pay

## 2021-07-06 NOTE — Telephone Encounter (Signed)
Called and spoke w/pt's daughter who stated that they were in skilled nursing at [facility] and he plans to stay longterm so they will be managing I will route to pharmd pool to see if we may discontinue episode as he will be in that facility permanently

## 2021-07-09 ENCOUNTER — Ambulatory Visit: Payer: Medicare Other | Admitting: Physician Assistant

## 2021-07-09 ENCOUNTER — Ambulatory Visit: Payer: Self-pay | Admitting: Pharmacist Clinician (PhC)/ Clinical Pharmacy Specialist

## 2021-07-09 NOTE — Telephone Encounter (Signed)
Anti coag encounter closed

## 2021-07-16 ENCOUNTER — Other Ambulatory Visit: Payer: Self-pay

## 2021-07-16 ENCOUNTER — Encounter: Payer: Medicare Other | Admitting: Physician Assistant

## 2021-07-16 DIAGNOSIS — L89152 Pressure ulcer of sacral region, stage 2: Secondary | ICD-10-CM | POA: Diagnosis not present

## 2021-07-16 NOTE — Progress Notes (Addendum)
DUAYNE, [name] ([id number]) Visit Report for 07/16/2021 Chief Complaint Document Details Patient Name: [name], [name] Date of Service: 07/16/2021 11:00 AM Medical Record Number: [id number] Patient Account Number: [id number] Date of Birth/Sex: 10/24/1931 (87 y.o. M) Treating RN: Huel Coventry Primary Care Provider: [name] Other Clinician: Referring Provider: [name] Treating Provider/Extender: Rowan Blase in Treatment: 38 Information Obtained from: Patient Chief Complaint Sacral and Left Gluteal Ulcers Electronic Signature(s) Signed: 07/16/2021 11:18:36 AM By: Lenda Kelp PA-C Entered By: Lenda Kelp on 07/16/2021 11:18:35 [name] Hilts A. ([id number]) -------------------------------------------------------------------------------- HPI Details Patient Name: [name] Hilts A. Date of Service: 07/16/2021 11:00 AM Medical Record Number: [id number] Patient Account Number: [id number] Date of Birth/Sex: [birth year] (87 y.o. M) Treating RN: Huel Coventry Primary Care Provider: [name] Other Clinician: Referring Provider: [name] Treating Provider/Extender: Rowan Blase in Treatment: 38 History of Present Illness HPI Description: 04/21/2019 ADMISSION This is an independent 87 year old man who lives in the independent part of [address] of [place]. He has a history of chronic lower extremity edema and wears compression stockings. He states about a month ago he was taking off the one on the right and pulled some skin off accidentally. He has had 2 small wounds on the right anterior and right medial lower extremity. I note looking through [facility] link that he had multiple venous ultrasounds in 2015 and 16. These were DVT rule outs. He did have a Baker's cyst on the left. He has not not had a previous wound history although he did have a history of cellulitis in his legs. Past medical history; includes aortic stenosis status post mechanical AVR in  2007 on chronic Coumadin, lower extremity edema with a history of cellulitis, history of MRSA. ABIs in our clinic were 1.1 on the right 6/3; patient with predominantly venous insufficiency ulcers in the right lower leg probably some degree of lymphedema. He had to wounds last week. We put him in 3 layer compression. The nurses at [facility] are changing the dressing. The area laterally is healed but he still has a small very painful area on the anterior tibial area. He is on Coumadin because of mechanical aortic valve. 6/10; venous insufficiency ulcers on the right lower leg. He has some degree of lymphedema. We have been using 3 layer compression with silver collagen small wound. Dimensions are improved. I gave him doxycycline last week which he tolerated because of surrounding erythema. This is improved also 6/17; arrives in clinic today with portable silver collagen dressing tightly adherent to the wound. Also felt that the wrap was too tight 3 layer being changed at the [facility] 6/24; patient's wound is small still adherent debris over the surface however even under illumination it was hard to see what was still open here. We have been using silver collagen 7/1; the patient arrives in the clinic today and the area on the right lower leg is healed. He has severe chronic venous insufficiency. He has 20/30 compression stockings. Readmission: 06/22/2020 on evaluation today patient presents for reevaluation here in our clinic although it has been a little over a year since we last saw him. He does have a history of lymphedema, venous insufficiency, anticoagulant therapy, he is on a pacemaker which is the reason for the anticoagulants, hypertension, and congestive heart failure. With that being said unfortunately he has been dealing with a wound of the left and right legs which has been giving him some trouble since arrival first 2021. That is  in regard to the left leg ulcer. In  regard to the right leg ulcer this is just a very small area that really I think will seal up quite nicely is more of the lymphedema opening than anything else. With that being said unfortunately the left leg is quite significant. We actually have noted that the patient's been dealing with this for quite some time and I think that there is a chance we may want to consider biopsy. With that being said he is on Coumadin therefore we were not able to perform a biopsy at this point. I think that something however in the future that may be warranted we may just have to take him off of the current Coumadin regimen in order to do this. We discussed doing it today but I am more concerned about the fact that he could have issues with uncontrolled bleeding and in that case we may have to send him to the ER. His daughter was not wanting to take that chance which I can completely understand. Nonetheless so far they have been using antibiotic ointment over the area I am just putting a protective dressing I do believe the patient is that he needs some type of compression. 07/07/2020 on evaluation today patient appears to be doing poorly in regard to his bilateral lower extremities today. He actually went to urgent care last night due to having pain in his legs due to the wraps. It appears they got somewhat tight on him because of the fact that he is having such significant swelling. He appears to be fluid overloaded I do not see any signs of infection actively at this point which is good news but unfortunately I think that this is something that may need to be addressed even by cardiology more so than just with a different or alternate type of wrap. I will contact his cardiologist, Dr. Mariah Milling, as well. 07/10/2020 upon evaluation today patient unfortunately appears to be doing worse even than when I saw him on Friday from the standpoint of where his legs stand. The main issue is that he has increased erythema although  there is still not warm to touch I am concerned about infection and about this getting significantly worse. Again my initial concern was more fluid overload and I discussed this with his cardiology office on Friday. With that being said I feel like this has gotten worse his daughter is in agreement and I think that the best option would be to send him to the hospital at this point. READMISSION 09/06/2020 Is a patient who has had 2 stays in this clinic firstly in 2020 and more recently from 06/02/2020 through 07/10/2020. He was cared for by [facility]. When he was last here he had bilateral lower extremity ulcers in the setting of chronic venous insufficiency with lymphedema. When he we last saw him he had weeping edema fluido Cellulitis and he was sent to the hospital. He was admitted initially from 06/30/2020 through 07/14/2020 with cellulitis plus stasis dermatitis. Treated empirically with vancomycin and had increases in his torsemide. He was then sent to the skilled level of [facility] but readmitted to hospital from 07/29/2020 through 08/10/2020 with blood loss anemia hemoglobin of 4 and OB positive stools. During this hospitalization he was noted to have a sacral wound at stage II. He was sent to [facility] [place] again in the skilled level. He had compression on his legs with Coban. He is now back at his independent living setting.  He has not been wearing compression and his daughter feels there is already increasing swelling. To have stockings at home not exactly sure at what strength. [name] HiltsSCHENK, Ramadan A. ([id number]) Past medical history includes lymphedema, chronic venous insufficiency, pacemaker, congestive heart failure, hypertension, aortic valve stenosis status post mechanical valve replacement in 2002 on chronic Coumadin, chronic kidney disease stage IIIb, atrial fibrillation, peripheral neuropathy, large hiatal hernia. We did not test his ABIs in the clinic  today. Readmission: 10/17/2020 on evaluation today patient appears to be doing somewhat poorly in regard to his left leg and the fact that he does have an open wound at this point. He has not had the compression socks placed at the facility they have not been putting them on at all unfortunately. He does not have on the right leg currently and apparently there is significant put him on the left leg with the wound which I understand is that can be somewhat tight obviously. Nonetheless I do believe the right leg needs to have a compression sock and I do believe on the left side right now we can do something a little better in order to try to get the wound healed at this point. The patient is on torsemide and currently allopurinol for gout. He also has daily weights due to congestive heart failure I am assuming in order to monitor his fluid status overall. He is currently a DNR at the facility. 10/30/2020 upon evaluation today patient appears to be doing somewhat poorly at this time in regard to his bilateral lower extremities. His left leg which is the only place where he had a wound last week when I saw him is actually doing worse with new wound openings. The right leg also has a small wound as well. However the most concerning thing is that last week he did not have any signs of infection or erythema there is no warmth to touch over the leg. This week the entire leg is more sore he also has erythema extending from the ankle to just below the knee with the area being erythematous and warm to touch which I believe represents cellulitis at minimum. I am concerned about how this is spread so quickly and the fact that he is having increased pain and also not feeling as well. All in all I discussed with the patient that unfortunately I feel like he may be best served going to the ER for further evaluation of this issue currently to try to see about the potential for IV antibiotics and to have  appropriate lab work performed. His daughter is present during the visit today and she was included in the decision making at this point. 11/13/2020 upon evaluation today patient appears to be doing well with regard to his leg ulcer. Fortunately he does not appear to be showing signs of infection anything like it was previous. This is great news. He was sent home with IV antibiotic therapy that seems to have done extremely well for him. No fevers, chills, nausea, vomiting, or diarrhea. 11/27/2020 upon evaluation today patient appears to be doing quite well in regard to his wounds currently. Fortunately there is no signs of active infection at this time which is great news and I am very pleased in this regard. He did have some concerns or his daughter did about an area on his gluteal region. With that being said I see nothing that is actually open at this point which is great news I do think continuing with a derma  cloud is a good way to go. With regard to his leg he does have a new area on the right leg of open wound locations which is new compared to last visit we will need to address this as well. Otherwise he seems to be doing quite well in my opinion. 12/15/2020 upon evaluation today patient appears to be doing well with regard to his leg ulcer on the left. With that being said he did not have the Tubigrip on which I think is integral to getting this closed as he continues to weep. Subsequently I think the Tubigrip will help control some of the edema which will help him in that regard. Fortunately there is no signs of active infection at this time. No fevers, chills, nausea, vomiting, or diarrhea. 01/01/2021 upon evaluation today patient appears to be doing well with regard to his wound. This is measuring better and actually is significantly smaller. Fortunately there is no signs of active infection at this time. No fevers, chills, nausea, vomiting, or diarrhea. 01/15/2021 on evaluation today patient  appears to be doing well with regard to his left leg ulcer though he still continues to weep quite a bit he did not have the Tubigrip on the day that he supposed to. Fortunately there is no signs of active infection at this time. No fevers, chills, nausea, vomiting, or diarrhea. 01/29/2021 upon evaluation today patient appears to be doing well with regard to his right leg which is completely healed. His left leg though not healed is just a very small open area that is weeping. With regard to his gluteal region this is actually new and in fact he has a wound that is really in the sacral area. This is stated to be a stage II that may be the case there is really no slough buildup I Ernie Hew leave it as such for the time being. We will keep a close eye on things however and see how it proceeds. 02/12/2021 upon evaluation today patient appears to be doing excellent in regard to his leg ulcers. In fact everything appears to be closed as best I can tell at this point. I think he is ready to transition into his compression stocking. With that being said his gluteal region still is open although it appears to be fairly clean I think the collagen is still a good thing to do here. We will see how things continue to improve over the next week or so. 02/26/2021 on evaluation today patient appears to be doing well with regard to his wound. He is tolerating the dressing changes without complication the wound is smaller though I feel like he is at the point where we need to try to do something to dry this up a little bit more I think switching from silver collagen to a silver alginate would be beneficial. 03/16/2021 upon evaluation today patient appears to be doing excellent in regard to his wound. He has been tolerating the dressing changes without complication. Fortunately there is no signs of active infection at this time. No fevers, chills, nausea, vomiting, or diarrhea. 04/02/2021 upon evaluation today patient appears to  be doing well with regard to his wound. He really does not have anything terribly open but nonetheless he still has a lot of redness in the sacral area I think there is still a lot of pressure getting to this region. With that being said I think he really needs to have more appropriate offloading I think that may be the biggest issue  he spends a lot of time sitting in his recliner from what I am hearing. 5/23; there has been some improvement in the sacral area not so much in the area on the left buttock. Both of these very tiny punched-out areas with surrounding erythema. They seem to be tender out of proportion to what you might expect looking at them. We have been using silver alginate on this area 04/30/2021 upon evaluation today patient appears to be doing a little bit more poorly in regard to the wounds in the gluteal region and sacral region. Fortunately there does not appear to be any signs of active infection which is great news but I do feel like there is some additional pressure injury here. I did discuss with the patient again today trying to keep pressure off the area I think this is still of utmost importance. Fortunately there is no signs of active infection systemically which is great news. 05/14/2021 upon evaluation today patient appears to be doing about the same in regard to his ulcer in the sacral region. Fortunately there does Gruenberg, Meagan A. ([id number]) not appear to be any signs of active infection which is great news and overall very pleased in that regard. With that being said he does have some bleeding when cleansed with saline and gauze over the central portion of the wound I think this is indicative of the better wound bed which is at least good news. 06/01/2021 upon evaluation today patient's wounds actually appear to be doing better one is healed the central is still open but seems to be doing excellent. There is minimal slough noted. 06/11/2021 upon evaluation today  patient appears to be doing about the same in regard to his wound in the sacral region. Fortunately there is no signs of infection currently which is great news. With that being said I do believe that the patient is having some issues here with pain but again I do not feel like it is any worse than previous. Fortunately there is no signs of active infection at this time. 06/25/2021 upon evaluation today patient appears to be doing a little bit worse in regard to the periwound although the wound itself is measuring a little bit smaller. Fortunately I do not see any signs of infection and I am pleased with the smaller size of the wound. Nonetheless the Band-Aid that was on today is absolutely not the right thing to have on and to be honest I think this is what caused the small area of excoriation/skin tear around the edges of the wound with a Band-Aid which is stuck weight too tight. He is post have a border foam in place and [facility] he had a large Band-Aid and silver alginate which was not even on the wound bed. 07/04/2021 upon evaluation today patient appears to be doing a little bit more poorly as compared to last time in fact the wound that closed before has another area right in that area this is not even the skin tear this is what appears to be potentially a reopening. He did not even have a dressing in place today which is unfortunate as well. With that being said he had a dressing on but it was nowhere on the wound. This has been on for a couple of days. Again I think he is sliding around a lot in the bed which makes this move but that means it needs to be checked more frequently to ensure that its in place and where it needs  to be. With all that being said I think still offloading is a big issue here. 07/16/2021 upon evaluation today patient's wound actually appears to be doing decently well all things considered he has a small area to the side of some irritation but in general it does not  seem to be doing significantly worse which is great news. Fortunately there is no signs of active infection at this time. No fevers, chills, nausea, vomiting, or diarrhea. Electronic Signature(s) Signed: 07/16/2021 5:02:18 PM By: Lenda Kelp PA-C Entered By: Lenda Kelp on 07/16/2021 17:02:18 Joellyn Quails ([id number]) -------------------------------------------------------------------------------- Physical Exam Details Patient Name: [name] Hilts A. Date of Service: 07/16/2021 11:00 AM Medical Record Number: [id number] Patient Account Number: [id number] Date of Birth/Sex: 1931-04-15 (87 y.o. M) Treating RN: Huel Coventry Primary Care Provider: [name] Other Clinician: Referring Provider: [name] Treating Provider/Extender: Rowan Blase in Treatment: 33 Constitutional Well-nourished and well-hydrated in no acute distress. Respiratory normal breathing without difficulty. Psychiatric this patient is able to make decisions and demonstrates good insight into disease process. Alert and Oriented x 3. pleasant and cooperative. Notes Upon inspection patient's wound bed again showed signs of good granulation epithelization at this point. Fortunately I do not see any evidence of infection which is great and overall I am extremely pleased in that regard but I do think that the patient is having some issues here with continued pressure relief. Electronic Signature(s) Signed: 07/16/2021 5:04:20 PM By: Lenda Kelp PA-C Entered By: Lenda Kelp on 07/16/2021 17:04:20 Joellyn Quails ([id number]) -------------------------------------------------------------------------------- Physician Orders Details Patient Name: [name] Hilts A. Date of Service: 07/16/2021 11:00 AM Medical Record Number: [id number] Patient Account Number: [id number] Date of Birth/Sex: [birth year] (87 y.o. M) Treating RN: Huel Coventry Primary Care Provider: [name] Other Clinician: Referring  Provider: [name] Treating Provider/Extender: Rowan Blase in Treatment: 33 Verbal / Phone Orders: No Diagnosis Coding ICD-10 Coding Code Description L89.152 Pressure ulcer of sacral region, stage 2 Z79.01 Long term (current) use of anticoagulants Z95.0 Presence of cardiac pacemaker I10 Essential (primary) hypertension I50.42 Chronic combined systolic (congestive) and diastolic (congestive) heart failure I25.10 Atherosclerotic heart disease of native coronary artery without angina pectoris Follow-up Appointments o Return Appointment in 2 weeks. Edema Control - Lymphedema / Segmental Compressive Device / Other Bilateral Lower Extremities o Patient to wear own compression stockings. Remove compression stockings every night before going to bed and put on every morning when getting up. - bilat lower leg o Elevate legs to the level of the heart and pump ankles as often as possible o Elevate leg(s) parallel to the floor when sitting. Off-Loading o Gel wheelchair cushion - keep gel cushion under him when out of bed at all times o Low air-loss mattress (Group 2) - continue air mattress to offload pressure off of wounds o Turn and reposition every 2 hours - keep off of sacral wound area/REPOSITION EVERY 2 HOURS FOR WOUND HEALING Wound Treatment Wound #12 - Sacrum Cleanser: Soap and Water 3 x Per Week/30 Days Discharge Instructions: Gently cleanse wound with antibacterial soap, rinse and pat dry prior to dressing wounds Primary Dressing: Hydrofera Blue Ready Transfer Foam, 2.5x2.5 (in/in) 3 x Per Week/30 Days Discharge Instructions: Apply Hydrofera Blue Ready to wound bed as directed Secondary Dressing: Mepilex Border Flex, 4x4 (in/in) (Generic) 3 x Per Week/30 Days Discharge Instructions: Recommend do not use bandaids-use a foam border for protectionApply to wound as directed. Do not cut. Electronic Signature(s) Signed: 07/16/2021 12:17:32 PM By: Elliot Gurney,  BSN, RN,  CWS, Radio producer, BSN Signed: 07/16/2021 5:15:29 PM By: Lenda Kelp PA-C Entered By: Elliot Gurney, BSN, RN, CWS, [name] on 07/16/2021 11:43:16 Joellyn Quails ([id number]) -------------------------------------------------------------------------------- Problem List Details Patient Name: [name] Hilts A. Date of Service: 07/16/2021 11:00 AM Medical Record Number: [id number] Patient Account Number: [id number] Date of Birth/Sex: 1931-03-04 (87 y.o. M) Treating RN: Huel Coventry Primary Care Provider: [name] Other Clinician: Referring Provider: [name] Treating Provider/Extender: Rowan Blase in Treatment: 38 Active Problems ICD-10 Encounter Code Description Active Date MDM Diagnosis L89.152 Pressure ulcer of sacral region, stage 2 01/29/2021 No Yes Z79.01 Long term (current) use of anticoagulants 10/17/2020 No Yes Z95.0 Presence of cardiac pacemaker 10/17/2020 No Yes I10 Essential (primary) hypertension 10/17/2020 No Yes I50.42 Chronic combined systolic (congestive) and diastolic (congestive) heart 10/17/2020 No Yes failure I25.10 Atherosclerotic heart disease of native coronary artery without angina 10/17/2020 No Yes pectoris Inactive Problems ICD-10 Code Description Active Date Inactive Date I89.0 Lymphedema, not elsewhere classified 10/17/2020 10/17/2020 I87.2 Venous insufficiency (chronic) (peripheral) 10/17/2020 10/17/2020 N82.956 Non-pressure chronic ulcer of other part of left lower leg with fat layer exposed 10/17/2020 10/17/2020 L97.812 Non-pressure chronic ulcer of other part of right lower leg with fat layer 10/17/2020 10/17/2020 exposed Resolved Problems Electronic Signature(s) Signed: 07/16/2021 11:18:27 AM By: Geni Bers, Tanis A. ([id number]) Entered By: Lenda Kelp on 07/16/2021 11:18:26 Winokur, Caspar A. ([id number]) -------------------------------------------------------------------------------- Progress Note Details Patient Name:  [name] Hilts A. Date of Service: 07/16/2021 11:00 AM Medical Record Number: [id number] Patient Account Number: [id number] Date of Birth/Sex: Jan 25, 1931 (87 y.o. M) Treating RN: Huel Coventry Primary Care Provider: [name] Other Clinician: Referring Provider: [name] Treating Provider/Extender: Rowan Blase in Treatment: 38 Subjective Chief Complaint Information obtained from Patient Sacral and Left Gluteal Ulcers History of Present Illness (HPI) 04/21/2019 ADMISSION This is an independent 87 year old man who lives in the independent part of [address] of [place]. He has a history of chronic lower extremity edema and wears compression stockings. He states about a month ago he was taking off the one on the right and pulled some skin off accidentally. He has had 2 small wounds on the right anterior and right medial lower extremity. I note looking through [facility] health link that he had multiple venous ultrasounds in 2015 and 16. These were DVT rule outs. He did have a Baker's cyst on the left. He has not not had a previous wound history although he did have a history of cellulitis in his legs. Past medical history; includes aortic stenosis status post mechanical AVR in 2007 on chronic Coumadin, lower extremity edema with a history of cellulitis, history of MRSA. ABIs in our clinic were 1.1 on the right 6/3; patient with predominantly venous insufficiency ulcers in the right lower leg probably some degree of lymphedema. He had to wounds last week. We put him in 3 layer compression. The nurses at [facility] are changing the dressing. The area laterally is healed but he still has a small very painful area on the anterior tibial area. He is on Coumadin because of mechanical aortic valve. 6/10; venous insufficiency ulcers on the right lower leg. He has some degree of lymphedema. We have been using 3 layer compression with silver collagen small wound. Dimensions are improved.  I gave him doxycycline last week which he tolerated because of surrounding erythema. This is improved also 6/17; arrives in clinic today with portable silver collagen dressing tightly adherent to the wound. Also felt that  the wrap was too tight 3 layer being changed at the [facility] 6/24; patient's wound is small still adherent debris over the surface however even under illumination it was hard to see what was still open here. We have been using silver collagen 7/1; the patient arrives in the clinic today and the area on the right lower leg is healed. He has severe chronic venous insufficiency. He has 20/30 compression stockings. Readmission: 06/22/2020 on evaluation today patient presents for reevaluation here in our clinic although it has been a little over a year since we last saw him. He does have a history of lymphedema, venous insufficiency, anticoagulant therapy, he is on a pacemaker which is the reason for the anticoagulants, hypertension, and congestive heart failure. With that being said unfortunately he has been dealing with a wound of the left and right legs which has been giving him some trouble since arrival first 2021. That is in regard to the left leg ulcer. In regard to the right leg ulcer this is just a very small area that really I think will seal up quite nicely is more of the lymphedema opening than anything else. With that being said unfortunately the left leg is quite significant. We actually have noted that the patient's been dealing with this for quite some time and I think that there is a chance we may want to consider biopsy. With that being said he is on Coumadin therefore we were not able to perform a biopsy at this point. I think that something however in the future that may be warranted we may just have to take him off of the current Coumadin regimen in order to do this. We discussed doing it today but I am more concerned about the fact that he could have  issues with uncontrolled bleeding and in that case we may have to send him to the ER. His daughter was not wanting to take that chance which I can completely understand. Nonetheless so far they have been using antibiotic ointment over the area I am just putting a protective dressing I do believe the patient is that he needs some type of compression. 07/07/2020 on evaluation today patient appears to be doing poorly in regard to his bilateral lower extremities today. He actually went to urgent care last night due to having pain in his legs due to the wraps. It appears they got somewhat tight on him because of the fact that he is having such significant swelling. He appears to be fluid overloaded I do not see any signs of infection actively at this point which is good news but unfortunately I think that this is something that may need to be addressed even by cardiology more so than just with a different or alternate type of wrap. I will contact his cardiologist, Dr. Mariah Milling, as well. 07/10/2020 upon evaluation today patient unfortunately appears to be doing worse even than when I saw him on Friday from the standpoint of where his legs stand. The main issue is that he has increased erythema although there is still not warm to touch I am concerned about infection and about this getting significantly worse. Again my initial concern was more fluid overload and I discussed this with his cardiology office on Friday. With that being said I feel like this has gotten worse his daughter is in agreement and I think that the best option would be to send him to the hospital at this point. READMISSION 09/06/2020 Is a patient who has  had 2 stays in this clinic firstly in 2020 and more recently from 06/02/2020 through 07/10/2020. He was cared for by [facility]. When he was last here he had bilateral lower extremity ulcers in the setting of chronic venous insufficiency with lymphedema. When he we last saw him he had weeping  edema fluido Cellulitis and he was sent to the hospital. He was admitted initially from 06/30/2020 through 07/14/2020 [facility], Koron A. ([id number]) with cellulitis plus stasis dermatitis. Treated empirically with vancomycin and had increases in his torsemide. He was then sent to the skilled level of [facility] but readmitted to hospital from 07/29/2020 through 08/10/2020 with blood loss anemia hemoglobin of 4 and OB positive stools. During this hospitalization he was noted to have a sacral wound at stage II. He was sent to [facility] of [place] again in the skilled level. He had compression on his legs with Coban. He is now back at his independent living setting. He has not been wearing compression and his daughter feels there is already increasing swelling. To have stockings at home not exactly sure at what strength. Past medical history includes lymphedema, chronic venous insufficiency, pacemaker, congestive heart failure, hypertension, aortic valve stenosis status post mechanical valve replacement in 2002 on chronic Coumadin, chronic kidney disease stage IIIb, atrial fibrillation, peripheral neuropathy, large hiatal hernia. We did not test his ABIs in the clinic today. Readmission: 10/17/2020 on evaluation today patient appears to be doing somewhat poorly in regard to his left leg and the fact that he does have an open wound at this point. He has not had the compression socks placed at the facility they have not been putting them on at all unfortunately. He does not have on the right leg currently and apparently there is significant put him on the left leg with the wound which I understand is that can be somewhat tight obviously. Nonetheless I do believe the right leg needs to have a compression sock and I do believe on the left side right now we can do something a little better in order to try to get the wound healed at this point. The patient is on torsemide and currently allopurinol for  gout. He also has daily weights due to congestive heart failure I am assuming in order to monitor his fluid status overall. He is currently a DNR at the facility. 10/30/2020 upon evaluation today patient appears to be doing somewhat poorly at this time in regard to his bilateral lower extremities. His left leg which is the only place where he had a wound last week when I saw him is actually doing worse with new wound openings. The right leg also has a small wound as well. However the most concerning thing is that last week he did not have any signs of infection or erythema there is no warmth to touch over the leg. This week the entire leg is more sore he also has erythema extending from the ankle to just below the knee with the area being erythematous and warm to touch which I believe represents cellulitis at minimum. I am concerned about how this is spread so quickly and the fact that he is having increased pain and also not feeling as well. All in all I discussed with the patient that unfortunately I feel like he may be best served going to the ER for further evaluation of this issue currently to try to see about the potential for IV antibiotics and to have appropriate lab work performed. His  daughter is present during the visit today and she was included in the decision making at this point. 11/13/2020 upon evaluation today patient appears to be doing well with regard to his leg ulcer. Fortunately he does not appear to be showing signs of infection anything like it was previous. This is great news. He was sent home with IV antibiotic therapy that seems to have done extremely well for him. No fevers, chills, nausea, vomiting, or diarrhea. 11/27/2020 upon evaluation today patient appears to be doing quite well in regard to his wounds currently. Fortunately there is no signs of active infection at this time which is great news and I am very pleased in this regard. He did have some concerns or his  daughter did about an area on his gluteal region. With that being said I see nothing that is actually open at this point which is great news I do think continuing with a derma cloud is a good way to go. With regard to his leg he does have a new area on the right leg of open wound locations which is new compared to last visit we will need to address this as well. Otherwise he seems to be doing quite well in my opinion. 12/15/2020 upon evaluation today patient appears to be doing well with regard to his leg ulcer on the left. With that being said he did not have the Tubigrip on which I think is integral to getting this closed as he continues to weep. Subsequently I think the Tubigrip will help control some of the edema which will help him in that regard. Fortunately there is no signs of active infection at this time. No fevers, chills, nausea, vomiting, or diarrhea. 01/01/2021 upon evaluation today patient appears to be doing well with regard to his wound. This is measuring better and actually is significantly smaller. Fortunately there is no signs of active infection at this time. No fevers, chills, nausea, vomiting, or diarrhea. 01/15/2021 on evaluation today patient appears to be doing well with regard to his left leg ulcer though he still continues to weep quite a bit he did not have the Tubigrip on the day that he supposed to. Fortunately there is no signs of active infection at this time. No fevers, chills, nausea, vomiting, or diarrhea. 01/29/2021 upon evaluation today patient appears to be doing well with regard to his right leg which is completely healed. His left leg though not healed is just a very small open area that is weeping. With regard to his gluteal region this is actually new and in fact he has a wound that is really in the sacral area. This is stated to be a stage II that may be the case there is really no slough buildup I Ernie Hew leave it as such for the time being. We will keep a close  eye on things however and see how it proceeds. 02/12/2021 upon evaluation today patient appears to be doing excellent in regard to his leg ulcers. In fact everything appears to be closed as best I can tell at this point. I think he is ready to transition into his compression stocking. With that being said his gluteal region still is open although it appears to be fairly clean I think the collagen is still a good thing to do here. We will see how things continue to improve over the next week or so. 02/26/2021 on evaluation today patient appears to be doing well with regard to his wound. He is tolerating the  dressing changes without complication the wound is smaller though I feel like he is at the point where we need to try to do something to dry this up a little bit more I think switching from silver collagen to a silver alginate would be beneficial. 03/16/2021 upon evaluation today patient appears to be doing excellent in regard to his wound. He has been tolerating the dressing changes without complication. Fortunately there is no signs of active infection at this time. No fevers, chills, nausea, vomiting, or diarrhea. 04/02/2021 upon evaluation today patient appears to be doing well with regard to his wound. He really does not have anything terribly open but nonetheless he still has a lot of redness in the sacral area I think there is still a lot of pressure getting to this region. With that being said I think he really needs to have more appropriate offloading I think that may be the biggest issue he spends a lot of time sitting in his recliner from what I am hearing. 5/23; there has been some improvement in the sacral area not so much in the area on the left buttock. Both of these very tiny punched-out areas with surrounding erythema. They seem to be tender out of proportion to what you might expect looking at them. We have been using silver alginate on this area 04/30/2021 upon evaluation today patient  appears to be doing a little bit more poorly in regard to the wounds in the gluteal region and sacral Anzalone, Kiko A. ([id number]) region. Fortunately there does not appear to be any signs of active infection which is great news but I do feel like there is some additional pressure injury here. I did discuss with the patient again today trying to keep pressure off the area I think this is still of utmost importance. Fortunately there is no signs of active infection systemically which is great news. 05/14/2021 upon evaluation today patient appears to be doing about the same in regard to his ulcer in the sacral region. Fortunately there does not appear to be any signs of active infection which is great news and overall very pleased in that regard. With that being said he does have some bleeding when cleansed with saline and gauze over the central portion of the wound I think this is indicative of the better wound bed which is at least good news. 06/01/2021 upon evaluation today patient's wounds actually appear to be doing better one is healed the central is still open but seems to be doing excellent. There is minimal slough noted. 06/11/2021 upon evaluation today patient appears to be doing about the same in regard to his wound in the sacral region. Fortunately there is no signs of infection currently which is great news. With that being said I do believe that the patient is having some issues here with pain but again I do not feel like it is any worse than previous. Fortunately there is no signs of active infection at this time. 06/25/2021 upon evaluation today patient appears to be doing a little bit worse in regard to the periwound although the wound itself is measuring a little bit smaller. Fortunately I do not see any signs of infection and I am pleased with the smaller size of the wound. Nonetheless the Band-Aid that was on today is absolutely not the right thing to have on and to be honest I think  this is what caused the small area of excoriation/skin tear around the edges of the wound with a  Band-Aid which is stuck weight too tight. He is post have a border foam in place and [facility] he had a large Band-Aid and silver alginate which was not even on the wound bed. 07/04/2021 upon evaluation today patient appears to be doing a little bit more poorly as compared to last time in fact the wound that closed before has another area right in that area this is not even the skin tear this is what appears to be potentially a reopening. He did not even have a dressing in place today which is unfortunate as well. With that being said he had a dressing on but it was nowhere on the wound. This has been on for a couple of days. Again I think he is sliding around a lot in the bed which makes this move but that means it needs to be checked more frequently to ensure that its in place and where it needs to be. With all that being said I think still offloading is a big issue here. 07/16/2021 upon evaluation today patient's wound actually appears to be doing decently well all things considered he has a small area to the side of some irritation but in general it does not seem to be doing significantly worse which is great news. Fortunately there is no signs of active infection at this time. No fevers, chills, nausea, vomiting, or diarrhea. Objective Constitutional Well-nourished and well-hydrated in no acute distress. Vitals Time Taken: 11:26 AM, Temperature: 97.5 F, Pulse: 60 bpm, Respiratory Rate: 16 breaths/min, Blood Pressure: 93/56 mmHg. Respiratory normal breathing without difficulty. Psychiatric this patient is able to make decisions and demonstrates good insight into disease process. Alert and Oriented x 3. pleasant and cooperative. General Notes: Upon inspection patient's wound bed again showed signs of good granulation epithelization at this point. Fortunately I do not see any evidence of  infection which is great and overall I am extremely pleased in that regard but I do think that the patient is having some issues here with continued pressure relief. Integumentary (Hair, Skin) Wound #12 status is Open. Original cause of wound was Pressure Injury. The date acquired was: 01/18/2021. The wound has been in treatment 24 weeks. The wound is located on the Sacrum. The wound measures 0.5cm length x 0.9cm width x 0.2cm depth; 0.353cm^2 area and 0.071cm^3 volume. There is Fat Layer (Subcutaneous Tissue) exposed. There is no tunneling or undermining noted. There is a medium amount of serous drainage noted. There is no granulation within the wound bed. There is a large (67-100%) amount of necrotic tissue within the wound bed including Adherent Slough. Wound #14 status is Healed - Epithelialized. Original cause of wound was Trauma. The date acquired was: 06/25/2021. The wound has been in treatment 1 weeks. The wound is located on the Left Gluteus. The wound measures 0cm length x 0cm width x 0cm depth; 0cm^2 area and 0cm^3 volume. There is a medium amount of serous drainage noted. Assessment Active Problems HENDRICK, PAVICH A. 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 Edema Control - Lymphedema / Segmental Compressive Device / Other: Patient to wear own compression stockings. Remove compression stockings every night before going to bed and put on every morning when getting up. - bilat lower leg Elevate legs to  the level of the heart and pump ankles as often as possible Elevate leg(s) parallel to the floor when sitting. Off-Loading: Gel wheelchair  cushion - keep gel cushion under him when out of bed at all times Low air-loss mattress (Group 2) - continue air mattress to offload pressure off of wounds Turn and reposition every 2 hours - keep off of sacral wound area/REPOSITION EVERY 2 HOURS FOR WOUND HEALING WOUND #12: - Sacrum Wound Laterality: Cleanser: Soap and Water 3 x Per Week/30 Days Discharge Instructions: Gently cleanse wound with antibacterial soap, rinse and pat dry prior to dressing wounds Primary Dressing: Hydrofera Blue Ready Transfer Foam, 2.5x2.5 (in/in) 3 x Per Week/30 Days Discharge Instructions: Apply Hydrofera Blue Ready to wound bed as directed Secondary Dressing: Mepilex Border Flex, 4x4 (in/in) (Generic) 3 x Per Week/30 Days Discharge Instructions: Recommend do not use bandaids-use a foam border for protectionApply to wound as directed. Do not cut. 1. Would recommend currently that the optimal thing is probably can to be for [place] to continue with the [facility] which I think is doing a good job. 2. I am also can recommend that we have the patient continue with the border foam dressing which I think is also something to be of concerned with. This does help with pressure relief. 3. I am also going to recommend that we have the patient continue with appropriate offloading this is to be honest I think the biggest issue the more he offloads the better off I think he will be. We will see patient back for reevaluation in 1 week here in the clinic. If anything worsens or changes patient will contact our office for additional recommendations. Electronic Signature(s) Signed: 07/16/2021 5:05:19 PM By: Lenda Kelp PA-C Entered By: Lenda Kelp on 07/16/2021 17:05:19 [name] Hilts A. ([id number]) -------------------------------------------------------------------------------- SuperBill Details Patient Name: [name] Hilts A. Date of Service: 07/16/2021 Medical Record Number: [id number] Patient Account Number:  [id number] Date of Birth/Sex: 1930/12/26 (87 y.o. M) Treating RN: Huel Coventry Primary Care Provider: [name] Other Clinician: Referring Provider: [name] Treating Provider/Extender: Rowan Blase in Treatment: 38 Diagnosis Coding ICD-10 Codes Code Description [phone] Pressure ulcer of sacral region, stage 2 Z79.01 Long term (current) use of anticoagulants Z95.0 Presence of cardiac pacemaker I10 Essential (primary) hypertension I50.42 Chronic combined systolic (congestive) and diastolic (congestive) heart failure I25.10 Atherosclerotic heart disease of native coronary artery without angina pectoris Facility Procedures CPT4 Code: [id number] Description: 99213 - WOUND CARE VISIT-LEV 3 EST PT Modifier: Quantity: 1 Physician Procedures CPT4 Code: [id number] Description: 99214 - WC PHYS LEVEL 4 - EST PT Modifier: Quantity: 1 CPT4 Code: Description: ICD-10 Diagnosis Description L89.152 Pressure ulcer of sacral region, stage 2 Z79.01 Long term (current) use of anticoagulants Z95.0 Presence of cardiac pacemaker I10 Essential (primary) hypertension Modifier: Quantity: Electronic Signature(s) Signed: 07/16/2021 5:06:02 PM By: Lenda Kelp PA-C Entered By: Lenda Kelp on 07/16/2021 17:06:01

## 2021-07-16 NOTE — Progress Notes (Addendum)
[name], [name] ([id number]) Visit Report for 07/16/2021 Arrival Information Details Patient Name: [name], [name] Date of Service: 07/16/2021 11:00 AM Medical Record Number: [id number] Patient Account Number: [id number] Date of Birth/Sex: [birth year] (87 y.o. M) Treating RN: [name] Primary Care Mindie Rawdon: [name] Other Clinician: Referring Vignesh Willert: [name] Treating Alyannah Sanks/Extender: Rowan Blase in Treatment: 38 Visit Information History Since Last Visit Has Dressing in Place as Prescribed: Yes Patient Arrived: Wheel Chair Pain Present Now: Yes Arrival Time: 11:25 Accompanied By: Laurice Record Transfer Assistance: Manual Patient Identification Verified: Yes Secondary Verification Process Completed: Yes Patient Requires Transmission-Based Precautions: No Patient Has Alerts: No Electronic Signature(s) Signed: 07/16/2021 12:17:32 PM By: Elliot Gurney, BSN, RN, CWS, [name] RN, BSN Entered By: Elliot Gurney, BSN, RN, CWS, [name] on 07/16/2021 11:26:39 Joellyn Quails ([id number]) -------------------------------------------------------------------------------- Clinic Level of Care Assessment Details Patient Name: [name] A. Date of Service: 07/16/2021 11:00 AM Medical Record Number: [id number] Patient Account Number: [id number] Date of Birth/Sex: Jan 26, 1931 (87 y.o. M) Treating RN: [name] Primary Care Axie Hayne: [name] Other Clinician: Referring Amalee Olsen: [name] Treating Leonarda Leis/Extender: Rowan Blase in Treatment: 38 Clinic Level of Care Assessment Items TOOL 4 Quantity Score []  - Use when only an EandM is performed on FOLLOW-UP visit 0 ASSESSMENTS - Nursing Assessment / Reassessment X - Reassessment of Co-morbidities (includes updates in patient status) 1 10 X- 1 5 Reassessment of Adherence to Treatment Plan ASSESSMENTS - Wound and Skin Assessment / Reassessment []  - Simple Wound Assessment / Reassessment - one wound 0 X- 2 5 Complex Wound  Assessment / Reassessment - multiple wounds []  - 0 Dermatologic / Skin Assessment (not related to wound area) ASSESSMENTS - Focused Assessment []  - Circumferential Edema Measurements - multi extremities 0 []  - 0 Nutritional Assessment / Counseling / Intervention []  - 0 Lower Extremity Assessment (monofilament, tuning fork, pulses) []  - 0 Peripheral Arterial Disease Assessment (using hand held doppler) ASSESSMENTS - Ostomy and/or Continence Assessment and Care []  - Incontinence Assessment and Management 0 []  - 0 Ostomy Care Assessment and Management (repouching, etc.) PROCESS - Coordination of Care X - Simple Patient / Family Education for ongoing care 1 15 []  - 0 Complex (extensive) Patient / Family Education for ongoing care X- 1 10 Staff obtains Consents, Records, Test Results / Process Orders []  - 0 Staff telephones HHA, Nursing Homes / Clarify orders / etc []  - 0 Routine Transfer to another Facility (non-emergent condition) []  - 0 Routine Hospital Admission (non-emergent condition) []  - 0 New Admissions / / Ordering NPWT, Apligraf, etc. []  - 0 Emergency Hospital Admission (emergent condition) X- 1 10 Simple Discharge Coordination []  - 0 Complex (extensive) Discharge Coordination PROCESS - Special Needs []  - Pediatric / Minor Patient Management 0 []  - 0 Isolation Patient Management []  - 0 Hearing / Language / Visual special needs []  - 0 Assessment of Community assistance (transportation, D/C planning, etc.) []  - 0 Additional assistance / Altered mentation []  - 0 Support Surface(s) Assessment (bed, cushion, seat, etc.) INTERVENTIONS - Wound Cleansing / Measurement Leveque, Miley A. ( ) []  - 0 Simple Wound Cleansing - one wound X- 2 5 Complex Wound Cleansing - multiple wounds X- 1 5 Wound Imaging (photographs - any number of wounds) []  - 0 Wound Tracing (instead of photographs) []  - 0 Simple Wound Measurement - one wound X-  2 5 Complex Wound Measurement - multiple wounds INTERVENTIONS - Wound Dressings []  - Small Wound Dressing one or multiple wounds 0 X- 1 15  Medium Wound Dressing one or multiple wounds []  - 0 Large Wound Dressing one or multiple wounds []  - 0 Application of Medications - topical []  - 0 Application of Medications - injection INTERVENTIONS - Miscellaneous []  - External ear exam 0 []  - 0 Specimen Collection (cultures, biopsies, blood, body fluids, etc.) []  - 0 Specimen(s) / Culture(s) sent or taken to Lab for analysis []  - 0 Patient Transfer (multiple staff / / Similar devices) []  - 0 Simple Staple / Suture removal (25 or less) []  - 0 Complex Staple / Suture removal (26 or more) []  - 0 Hypo / Hyperglycemic Management (close monitor of Blood Glucose) []  - 0 Ankle / Brachial Index (ABI) - do not check if billed separately X- 1 5 Vital Signs Has the patient been seen at the hospital within the last three years: Yes Total Score: 105 Level Of Care: New/Established - Level 3 Electronic Signature(s) Signed: 07/16/2021 12:17:32 PM By: , BSN, RN, CWS, [name] RN, BSN Entered By: , BSN, RN, CWS, [name] on 07/16/2021 11:51:26 ( ) -------------------------------------------------------------------------------- Encounter Discharge Information Details Patient Name: Nurse, [name] Date of Service: 07/16/2021 11:00 AM Medical Record Number: Patient Account Number: Date of Birth/Sex: 04/10/1931 (87 y.o. M) Treating RN: Elliot Gurney Primary Care Dashana Guizar: Elliot Gurney Other Clinician: Referring Nirali Magouirk: 07/18/2021 Treating Natonya Finstad/Extender: Joellyn Quails in Treatment: 38 Encounter Discharge Information Items Discharge Condition: Stable Ambulatory Status: Wheelchair Discharge Destination: Skilled Nursing Facility Telephoned: No Orders Sent: Yes Transportation: Other Accompanied By: daughter Schedule Follow-up  Appointment: Yes Clinical Summary of Care: Electronic Signature(s) Signed: 07/16/2021 12:14:57 PM By: 07/18/2021, BSN, RN, CWS, [name] RN, BSN Entered By: [id number], BSN, RN, CWS, [name] on 07/16/2021 12:14:57 09/14/1931 (06-11-1977) -------------------------------------------------------------------------------- General Visit Notes Details Patient Name: [name] A. Date of Service: 07/16/2021 11:00 AM Medical Record Number: [name] Patient Account Number: Rowan Blase Date of Birth/Sex: July 27, 1931 (87 y.o. M) Treating RN: Elliot Gurney Primary Care Analayah Brooke: Elliot Gurney Other Clinician: Referring Kenzee Bassin: 07/18/2021 Treating Shyrl Obi/Extender: Joellyn Quails in Treatment: 38 Notes 2 small white in color bulges noticed near patient's anus. PA notified. RN unsure of etiology. Does not appear to bother the patient and patient does not respond when the area is examined. Electronic Signature(s) Signed: 07/16/2021 12:14:13 PM By: 07/18/2021, BSN, RN, CWS, [name] RN, BSN Entered By: [id number], BSN, RN, CWS, [name] on 07/16/2021 12:14:13 09/14/1931 (06-11-1977) -------------------------------------------------------------------------------- Lower Extremity Assessment Details Patient Name: NAPHTALI, ZYWICKI A. Date of Service: 07/16/2021 11:00 AM Medical Record Number: [name] Patient Account Number: Rowan Blase Date of Birth/Sex: December 19, 1930 (87 y.o. M) Treating RN: Elliot Gurney Primary Care Maxten Shuler: Elliot Gurney Other Clinician: Referring Dana Debo: 07/18/2021 Treating Mikeisha Lemonds/Extender: Joellyn Quails in Treatment: 38 Electronic Signature(s) Signed: 07/16/2021 12:17:32 PM By: 07/18/2021, BSN, RN, CWS, [name] RN, BSN Entered By: [id number], BSN, RN, CWS, [name] on 07/16/2021 11:41:09 09/14/1931 (06-11-1977) -------------------------------------------------------------------------------- Multi Wound Chart Details Patient Name: [name] A. Date of Service: 07/16/2021 11:00 AM Medical Record  Number: [name] Patient Account Number: Rowan Blase Date of Birth/Sex: 1931-08-07 (87 y.o. M) Treating RN: Elliot Gurney Primary Care Kal Chait: Elliot Gurney Other Clinician: Referring Nadiya Pieratt: 07/18/2021 Treating Gram Siedlecki/Extender: Joellyn Quails in Treatment: 38 Vital Signs Height(in): Pulse(bpm): 60 Weight(lbs): Blood Pressure(mmHg): 93/56 Body Mass Index(BMI): Temperature(F): 97.5 Respiratory Rate(breaths/min): 16 Photos: [12:No Photos] [14:No Photos] [N/A:N/A] Wound Location: [12:Sacrum] [14:Left Gluteus] [N/A:N/A] Wounding Event: [12:Pressure Injury] [14:Trauma] [N/A:N/A] Primary Etiology: [12:Pressure Ulcer] [14:Pressure Ulcer] [N/A:N/A] Comorbid History: [12:Cataracts, Sleep  Apnea, Congestive Heart Failure, Coronary Artery Disease, Hypertension, Osteoarthritis] [14:N/A] [N/A:N/A] Date Acquired: [12:01/18/2021] [14:06/25/2021] [N/A:N/A] Weeks of Treatment: [12:24] [14:1] [N/A:N/A] Wound Status: [12:Open] [14:Healed - Epithelialized] [N/A:N/A] Clustered Wound: [12:Yes] [14:No] [N/A:N/A] Clustered Quantity: [12:2] [14:N/A] [N/A:N/A] Measurements L x W x D (cm) [12:0.5x0.9x0.2] [14:0x0x0] [N/A:N/A] Area (cm) : [12:0.353] [14:0] [N/A:N/A] Volume (cm) : [12:0.071] [14:0] [N/A:N/A] % Reduction in Area: [12:-7.00%] [14:100.00%] [N/A:N/A] % Reduction in Volume: [12:28.30%] [14:100.00%] [N/A:N/A] Classification: [12:Category/Stage II] [14:Category/Stage II] [N/A:N/A] Exudate Amount: [12:Medium] [14:Medium] [N/A:N/A] Exudate Type: [12:Serous] [14:Serous] [N/A:N/A] Exudate Color: [12:amber] [14:amber] [N/A:N/A] Granulation Amount: [12:None Present (0%)] [14:N/A] [N/A:N/A] Necrotic Amount: [12:Large (67-100%)] [14:N/A] [N/A:N/A] Exposed Structures: [12:Fat Layer (Subcutaneous Tissue): Yes Fascia: No Tendon: No Muscle: No Joint: No Bone: No] [14:N/A] [N/A:N/A] Treatment Notes Electronic Signature(s) Signed: 07/16/2021 12:17:32 PM By: Elliot GurneyWoody, BSN, RN, CWS, [name] RN, BSN Entered  By: Elliot GurneyWoody, BSN, RN, CWS, [name] on 07/16/2021 11:41:28 [name], Kaveon A. ([id number]) -------------------------------------------------------------------------------- Multi-Disciplinary Care Plan Details Patient Name: [name] HiltsSCHENK, Elin A. Date of Service: 07/16/2021 11:00 AM Medical Record Number: [id number] Patient Account Number: [id number] Date of Birth/Sex: 25-Apr-1931 (87 y.o. M) Treating RN: [name] CoventryWoody, [name] Primary Care Theodora Lalanne: Marcelino DusterJohnston, John Other Clinician: Referring Jaia Alonge: Marcelino DusterJohnston, John Treating Sorren Vallier/Extender: Rowan BlaseStone, Hoyt Weeks in Treatment: 5238 Active Inactive Electronic Signature(s) Signed: 08/29/2021 2:15:19 PM By: Elliot GurneyWoody, BSN, RN, CWS, [name] RN, BSN Previous Signature: 07/16/2021 12:17:32 PM Version By: Elliot GurneyWoody, BSN, RN, CWS, [name] RN, BSN Entered By: Elliot GurneyWoody, BSN, RN, CWS, [name] on 08/29/2021 14:15:18 [name], Timmothy A. ([id number]) -------------------------------------------------------------------------------- Pain Assessment Details Patient Name: [name] HiltsSCHENK, Daylyn A. Date of Service: 07/16/2021 11:00 AM Medical Record Number: [id number] Patient Account Number: [id number] Date of Birth/Sex: 25-Apr-1931 (87 y.o. M) Treating RN: [name] CoventryWoody, [name] Primary Care Jamiyah Dingley: Marcelino DusterJohnston, John Other Clinician: Referring Shyheim Tanney: Marcelino DusterJohnston, John Treating Caleen Taaffe/Extender: Rowan BlaseStone, Hoyt Weeks in Treatment: 38 Active Problems Location of Pain Severity and Description of Pain Patient Has Paino Yes Site Locations Pain Location: Pain in Ulcers Pain Management and Medication Current Pain Management: Notes Patient states he took a Xanex this morning. Electronic Signature(s) Signed: 07/16/2021 12:17:32 PM By: Elliot GurneyWoody, BSN, RN, CWS, [name] RN, BSN Entered By: Elliot GurneyWoody, BSN, RN, CWS, [name] on 07/16/2021 11:27:35 [name], Zeke A. ([id number]) -------------------------------------------------------------------------------- Wound Assessment Details Patient Name: [name] HiltsSCHENK, Hamad A. Date of Service: 07/16/2021 11:00 AM Medical  Record Number: [id number] Patient Account Number: [id number] Date of Birth/Sex: 25-Apr-1931 (87 y.o. M) Treating RN: [name] CoventryWoody, [name] Primary Care Verlyn Lambert: Marcelino DusterJohnston, John Other Clinician: Referring Charleton Deyoung: Marcelino DusterJohnston, John Treating Salah Burlison/Extender: Allen DerryStone, Hoyt Weeks in Treatment: 38 Wound Status Wound Number: 12 Primary Pressure Ulcer Etiology: Wound Location: Sacrum Wound Open Wounding Event: Pressure Injury Status: Date Acquired: 01/18/2021 Comorbid Cataracts, Sleep Apnea, Congestive Heart Failure, Weeks Of Treatment: 24 History: Coronary Artery Disease, Hypertension, Osteoarthritis Clustered Wound: Yes Photos Photo Uploaded By: Elliot GurneyWoody, BSN, RN, CWS, [name] on 07/16/2021 15:11:29 Wound Measurements Length: (cm) 0.5 Width: (cm) 0.9 Depth: (cm) 0.2 Clustered Quantity: 2 Area: (cm) 0.353 Volume: (cm) 0.071 % Reduction in Area: -7% % Reduction in Volume: 28.3% Tunneling: No Undermining: No Wound Description Classification: Category/Stage II Exudate Amount: Medium Exudate Type: Serous Exudate Color: amber Slough/Fibrino Yes Wound Bed Granulation Amount: None Present (0%) Exposed Structure Necrotic Amount: Large (67-100%) Fascia Exposed: No Necrotic Quality: Adherent Slough Fat Layer (Subcutaneous Tissue) Exposed: Yes Tendon Exposed: No Muscle Exposed: No Joint Exposed: No Bone Exposed: No Electronic Signature(s) Signed: 07/16/2021 12:17:32 PM By: Elliot GurneyWoody, BSN, RN, CWS, [name] RN, BSN Entered By: Elliot GurneyWoody, BSN, RN, CWS, [name] on 07/16/2021 11:30:33 [name] HiltsSCHENK, Andrae A. ([id number]) --------------------------------------------------------------------------------  Wound Assessment Details Patient Name: ADALBERTO, METZGAR A. Date of Service: 07/16/2021 11:00 AM Medical Record Number: [id number] Patient Account Number: [id number] Date of Birth/Sex: February 13, 1931 (87 y.o. M) Treating RN: [name] Primary Care Katrin Grabel: [name] Other Clinician: Referring Breshay Ilg: [name] Treating  Jesscia Imm/Extender: Allen Derry Weeks in Treatment: 38 Wound Status Wound Number: 14 Primary Etiology: Pressure Ulcer Wound Location: Left Gluteus Wound Status: Healed - Epithelialized Wounding Event: Trauma Date Acquired: 06/25/2021 Weeks Of Treatment: 1 Clustered Wound: No Photos Photo Uploaded By: Elliot Gurney, BSN, RN, CWS, [name] on 07/16/2021 15:11:49 Wound Measurements Length: (cm) 0 Width: (cm) 0 Depth: (cm) 0 Area: (cm) 0 Volume: (cm) 0 % Reduction in Area: 100% % Reduction in Volume: 100% Wound Description Classification: Category/Stage II Exudate Amount: Medium Exudate Type: Serous Exudate Color: amber Treatment Notes Wound #14 (Gluteus) Wound Laterality: Left Cleanser Peri-Wound Care Topical Primary Dressing Secondary Dressing Secured With Compression Wrap Compression Stockings Add-Ons TREMOND, SHIMABUKURO ([id number]) Electronic Signature(s) Signed: 07/16/2021 12:17:32 PM By: Elliot Gurney, BSN, RN, CWS, [name] RN, BSN Entered By: Elliot Gurney, BSN, RN, CWS, [name] on 07/16/2021 11:30:47 [name] A. ([id number]) -------------------------------------------------------------------------------- Vitals Details Patient Name: [name] A. Date of Service: 07/16/2021 11:00 AM Medical Record Number: [id number] Patient Account Number: [id number] Date of Birth/Sex: January 06, 1931 (87 y.o. M) Treating RN: [name] Primary Care Dirk Vanaman: [name] Other Clinician: Referring Makenize Messman: [name] Treating Arsenia Goracke/Extender: Rowan Blase in Treatment: 38 Vital Signs Time Taken: 11:26 Temperature (F): 97.5 Pulse (bpm): 60 Respiratory Rate (breaths/min): 16 Blood Pressure (mmHg): 93/56 Reference Range: 80 - 120 mg / dl Electronic Signature(s) Signed: 07/16/2021 12:17:32 PM By: Elliot Gurney, BSN, RN, CWS, [name] RN, BSN Entered By: Elliot Gurney, BSN, RN, CWS, [name] on 07/16/2021 11:26:59

## 2021-07-25 ENCOUNTER — Non-Acute Institutional Stay: Payer: Medicare Other | Admitting: Student

## 2021-07-25 ENCOUNTER — Other Ambulatory Visit: Payer: Self-pay

## 2021-07-25 DIAGNOSIS — R531 Weakness: Secondary | ICD-10-CM

## 2021-07-25 DIAGNOSIS — I5032 Chronic diastolic (congestive) heart failure: Secondary | ICD-10-CM

## 2021-07-25 DIAGNOSIS — E46 Unspecified protein-calorie malnutrition: Secondary | ICD-10-CM

## 2021-07-25 DIAGNOSIS — Z515 Encounter for palliative care: Secondary | ICD-10-CM

## 2021-07-25 NOTE — Progress Notes (Signed)
[facility] Consult Note Telephone: [phone]  Fax: [phone]    Date of encounter: 07/25/21 11:42 AM PATIENT NAME: [name] [address] [place] [address]   [phone] (home)  DOB: 04-Apr-1931 MRN: [id number] PRIMARY CARE PROVIDER:    Dr. Feliciana Rossetti PROVIDER:   Dr. Ouida Sills  RESPONSIBLE PARTY:    Contact Information     Name Relation Home Work Mobile   Kerrie [place] Daughter [phone]     Troxler,Suzanne Daughter [phone]  [phone]        I met face to face with patient and family in the facility. Palliative Care was asked to follow this patient by consultation request of  Dr. Ouida Sills to address advance care planning and complex medical decision making. This is a follow up visit.                                   ASSESSMENT AND PLAN / RECOMMENDATIONS:   Advance Care Planning/Goals of Care: Goals include to maximize quality of life and symptom management. Our advance care planning conversation included a discussion about:    The value and importance of advance care planning  Experiences with loved ones who have been seriously ill or have died  Exploration of personal, cultural or spiritual beliefs that might influence medical decisions  Exploration of goals of care in the event of a sudden injury or illness  Decision not to resuscitate or to de-escalate disease focused treatments due to poor prognosis. Reviewed code status; continue DNR. CODE STATUS: DNR  Discussed goals of care at length, family wishes for comfort and patient to be managed at facility.  They are open to patient being referred for hospice evaluation given his recent declines. Patient is agreeable to the additional support that he would receive. Education provided on palliative vs. Hospice services. Will refer patient with dx of protein calorie malnutrition, heart failure.   Symptom  Management/Plan:  Protein calorie malnutrition-patient with continued weight loss poor appetite. He is currently receiving mirtazapine. Current weight 157 pounds today; 167 pounds on 06/28/21, 220 pounds 11/13/20. Offer patient foods patient enjoys.   Generalized weakness-staff to continue assisting with adl's. Out of bed as tolerated.   Heart failure-patient with shortness of breath with exertion; edema managed at this time. Continue oxygen PRN.  Follow up Palliative Care Visit: Palliative care will continue to follow for complex medical decision making, advance care planning, and clarification of goals. Return prn.  I spent 60 minutes providing this consultation. More than 50% of the time in this consultation was spent in counseling and care coordination.    PPS: 30%  HOSPICE ELIGIBILITY/DIAGNOSIS: protein calorie malnutrition, CHF  Chief Complaint: Palliative Medicine follow up visit.   HISTORY OF PRESENT ILLNESS:  [name] is a 87 y.o. year old male  with chronic diastolic HF, protein calorie malnutrition, aortic stenosis w/valve replacement, hypertension, CAD, venous stasis .   Patient with marked decline in the past couple of weeks. He was ambulatory a couple of weeks ago; now relies on sit to stand lift for transfers. He reports not feeling well, but not any nonspecific complaints. He is sleeping more throughout the day, worsening weakness. He is now incontinent of bowel and bladder. His appetite has declined. He is only eating bites in the past several days. Weight loss noted; he was 157 pounds this morning; he was 167 pounds  on 06/28/21. Patient was 220 pounds 11/13/20. Weight loss noted in spite of taking mirtazapine. Patient was started on Rocephin 1 gm IM x 3 days due to elevated white count. Denies any urinary complaints. He does report occasional coughing with eating/drinking. Patient has two stage 2 wounds to buttocks/sacral that he has been going to wound clinic for  care; facility staff also changes dressings. Patient endorses shortness of breath with exertion, edema to BLE has been managed. His warfarin was stopped due to critical lab values. Family was given option for patient to be evaluated in ED given critical lab values; family/patient opted for patient to be treated at facility. Family wishes for comfort for patient. A 10-point review of systems is negative, except for the pertinent positives and negatives detailed in the HPI.    Patient received sitting up to w/c in dining room. He only ate a popsicle for lunch after much encouragement. He is asking to go back to bed. Patient is able to answer questions without difficulty. He does have weak, raspy voice; ill appearing.  Labs on 07/24/21:  CBC: WBC 30.0, RBC 4.31, hemoglobin 12.5, HCT 36.5, platelets 295  CMP: glucose 185, BUN 131, creatinine 3.26, GFR 18, sodium 116, potassium 5.5, chloride 78, CO2 18, calcium 10.0, albumin 3.9, alkaline phosphatase 207, AST 40, ALT 10.  INR: 8.6  TSH: 2.120  History obtained from review of EMR, discussion with primary team, and interview with family, facility staff/caregiver and/or [name].  I reviewed available labs, medications, imaging, studies and related documents from the EMR.  Records reviewed and summarized above.    Physical Exam:  Constitutional: NAD General: frail appearing, ill appearing EYES: anicteric sclera, lids intact, no discharge  ENMT: intact hearing, oral mucous membranes moist CV: S1S2, RRR, no LE edema Pulmonary: LCTA, no increased work of breathing, no cough, room air Abdomen: normo-active BS + 4 quadrants, soft and non tender GU: deferred MSK:  moves extremities, w/c bound Skin: cool and dry, fingertips cyanotic, no rashes or wounds on visible skin Neuro: generalized weakness, A & O x 3 Psych: non-anxious affect, cooperative Hem/lymph/immuno: no widespread bruising   Thank you for the opportunity to participate in the care  of [name].  The palliative care team will continue to follow. Please call our office at [phone] if we can be of additional assistance.   Ezekiel Slocumb, NP   COVID-19 PATIENT SCREENING TOOL Asked and negative response unless otherwise noted:   Have you had symptoms of covid, tested positive or been in contact with someone with symptoms/positive test in the past 5-10 days? No

## 2021-07-31 ENCOUNTER — Ambulatory Visit: Payer: Medicare Other | Admitting: Physician Assistant

## 2021-08-13 ENCOUNTER — Ambulatory Visit: Payer: Medicare Other | Admitting: Physician Assistant

## 2021-08-25 DEATH — deceased
